# Patient Record
Sex: Female | Born: 1955
Health system: Southern US, Community
[De-identification: ages and names within clinical notes are randomized; demographics above are authoritative.]

## PROBLEM LIST (undated history)

## (undated) DIAGNOSIS — K589 Irritable bowel syndrome without diarrhea: Secondary | ICD-10-CM

## (undated) DIAGNOSIS — R011 Cardiac murmur, unspecified: Secondary | ICD-10-CM

## (undated) DIAGNOSIS — Z974 Presence of external hearing-aid: Secondary | ICD-10-CM

## (undated) DIAGNOSIS — H9190 Unspecified hearing loss, unspecified ear: Secondary | ICD-10-CM

## (undated) DIAGNOSIS — N189 Chronic kidney disease, unspecified: Secondary | ICD-10-CM

## (undated) DIAGNOSIS — J189 Pneumonia, unspecified organism: Secondary | ICD-10-CM

## (undated) DIAGNOSIS — N179 Acute kidney failure, unspecified: Secondary | ICD-10-CM

## (undated) DIAGNOSIS — I272 Pulmonary hypertension, unspecified: Secondary | ICD-10-CM

## (undated) DIAGNOSIS — E039 Hypothyroidism, unspecified: Secondary | ICD-10-CM

## (undated) DIAGNOSIS — F32A Depression, unspecified: Secondary | ICD-10-CM

## (undated) DIAGNOSIS — E0590 Thyrotoxicosis, unspecified without thyrotoxic crisis or storm: Secondary | ICD-10-CM

## (undated) DIAGNOSIS — Z87442 Personal history of urinary calculi: Secondary | ICD-10-CM

## (undated) DIAGNOSIS — G4730 Sleep apnea, unspecified: Secondary | ICD-10-CM

## (undated) DIAGNOSIS — J45909 Unspecified asthma, uncomplicated: Secondary | ICD-10-CM

## (undated) DIAGNOSIS — I70229 Atherosclerosis of native arteries of extremities with rest pain, unspecified extremity: Secondary | ICD-10-CM

## (undated) DIAGNOSIS — J9601 Acute respiratory failure with hypoxia: Secondary | ICD-10-CM

## (undated) DIAGNOSIS — D869 Sarcoidosis, unspecified: Secondary | ICD-10-CM

## (undated) DIAGNOSIS — K219 Gastro-esophageal reflux disease without esophagitis: Secondary | ICD-10-CM

## (undated) DIAGNOSIS — J302 Other seasonal allergic rhinitis: Secondary | ICD-10-CM

## (undated) DIAGNOSIS — I499 Cardiac arrhythmia, unspecified: Secondary | ICD-10-CM

## (undated) HISTORY — DX: Atherosclerosis of native arteries of extremities with rest pain, unspecified extremity: I70.229

## (undated) HISTORY — PX: EYE SURGERY: SHX253

## (undated) HISTORY — DX: Acute kidney failure, unspecified: N17.9

## (undated) HISTORY — PX: WISDOM TOOTH EXTRACTION: SHX21

## (undated) HISTORY — PX: TUBAL LIGATION: SHX77

## (undated) HISTORY — DX: Sarcoidosis, unspecified: D86.9

## (undated) HISTORY — PX: ABDOMINAL HYSTERECTOMY: SHX81

## (undated) HISTORY — PX: COLON SURGERY: SHX602

## (undated) HISTORY — DX: Acute respiratory failure with hypoxia: J96.01

---

## 1988-10-17 HISTORY — PX: PARTIAL HYSTERECTOMY: SHX80

## 2007-05-01 ENCOUNTER — Ambulatory Visit: Payer: Self-pay

## 2007-07-18 ENCOUNTER — Ambulatory Visit: Payer: Self-pay | Admitting: Specialist

## 2007-08-14 ENCOUNTER — Ambulatory Visit: Payer: Self-pay | Admitting: Obstetrics and Gynecology

## 2007-09-18 ENCOUNTER — Ambulatory Visit: Payer: Self-pay | Admitting: Unknown Physician Specialty

## 2007-09-18 HISTORY — PX: COLONOSCOPY: SHX5424

## 2007-12-05 ENCOUNTER — Ambulatory Visit: Payer: Self-pay | Admitting: Specialist

## 2008-06-25 ENCOUNTER — Ambulatory Visit: Payer: Self-pay

## 2008-07-14 ENCOUNTER — Ambulatory Visit: Payer: Self-pay | Admitting: Unknown Physician Specialty

## 2008-10-30 ENCOUNTER — Ambulatory Visit: Payer: Self-pay | Admitting: Obstetrics and Gynecology

## 2008-12-23 ENCOUNTER — Ambulatory Visit: Payer: Self-pay | Admitting: Unknown Physician Specialty

## 2010-03-25 ENCOUNTER — Ambulatory Visit: Payer: Self-pay | Admitting: Internal Medicine

## 2010-04-06 ENCOUNTER — Inpatient Hospital Stay: Payer: Self-pay | Admitting: Internal Medicine

## 2010-04-06 ENCOUNTER — Ambulatory Visit: Payer: Self-pay | Admitting: Unknown Physician Specialty

## 2010-05-26 ENCOUNTER — Ambulatory Visit: Payer: Self-pay | Admitting: Unknown Physician Specialty

## 2010-05-26 HISTORY — PX: COLONOSCOPY: SHX174

## 2010-05-28 LAB — PATHOLOGY REPORT

## 2010-06-04 ENCOUNTER — Ambulatory Visit: Payer: Self-pay | Admitting: Unknown Physician Specialty

## 2010-08-03 ENCOUNTER — Other Ambulatory Visit: Payer: Self-pay | Admitting: Unknown Physician Specialty

## 2010-10-21 ENCOUNTER — Other Ambulatory Visit: Payer: Self-pay | Admitting: Unknown Physician Specialty

## 2010-12-07 ENCOUNTER — Other Ambulatory Visit: Payer: Self-pay | Admitting: Unknown Physician Specialty

## 2010-12-19 ENCOUNTER — Inpatient Hospital Stay: Payer: Self-pay | Admitting: Surgery

## 2011-01-05 ENCOUNTER — Ambulatory Visit: Payer: Self-pay | Admitting: Surgery

## 2011-02-08 DIAGNOSIS — D869 Sarcoidosis, unspecified: Secondary | ICD-10-CM | POA: Insufficient documentation

## 2011-02-16 ENCOUNTER — Ambulatory Visit: Payer: Self-pay | Admitting: Surgery

## 2011-03-01 ENCOUNTER — Ambulatory Visit: Payer: Self-pay | Admitting: Surgery

## 2011-03-06 ENCOUNTER — Other Ambulatory Visit: Payer: Self-pay | Admitting: Surgery

## 2011-03-07 ENCOUNTER — Inpatient Hospital Stay: Payer: Self-pay | Admitting: Surgery

## 2011-03-08 LAB — PATHOLOGY REPORT

## 2011-05-10 ENCOUNTER — Ambulatory Visit: Payer: Self-pay | Admitting: Internal Medicine

## 2011-05-11 ENCOUNTER — Ambulatory Visit: Payer: Self-pay | Admitting: Urology

## 2011-08-04 ENCOUNTER — Ambulatory Visit: Payer: Self-pay | Admitting: Internal Medicine

## 2011-12-20 LAB — HM PAP SMEAR: HM Pap smear: NORMAL

## 2012-05-03 ENCOUNTER — Ambulatory Visit: Payer: Self-pay | Admitting: Urology

## 2012-08-22 ENCOUNTER — Ambulatory Visit: Payer: Self-pay | Admitting: Internal Medicine

## 2012-09-03 ENCOUNTER — Ambulatory Visit: Payer: Self-pay | Admitting: Internal Medicine

## 2012-12-14 ENCOUNTER — Other Ambulatory Visit: Payer: Self-pay | Admitting: Unknown Physician Specialty

## 2012-12-14 LAB — CLOSTRIDIUM DIFFICILE BY PCR

## 2012-12-26 ENCOUNTER — Other Ambulatory Visit: Payer: Self-pay | Admitting: Unknown Physician Specialty

## 2012-12-26 ENCOUNTER — Inpatient Hospital Stay: Payer: Self-pay | Admitting: Internal Medicine

## 2012-12-26 LAB — CBC WITH DIFFERENTIAL/PLATELET
Basophil #: 0 10*3/uL (ref 0.0–0.1)
Basophil %: 1 %
Eosinophil #: 0.2 10*3/uL (ref 0.0–0.7)
Eosinophil %: 6.2 %
HCT: 39.3 % (ref 35.0–47.0)
HGB: 13.5 g/dL (ref 12.0–16.0)
Lymphocyte #: 0.6 10*3/uL — ABNORMAL LOW (ref 1.0–3.6)
Lymphocyte %: 15.9 %
MCH: 30.7 pg (ref 26.0–34.0)
MCHC: 34.3 g/dL (ref 32.0–36.0)
MCV: 90 fL (ref 80–100)
Monocyte #: 0.4 x10 3/mm (ref 0.2–0.9)
Monocyte %: 11.2 %
Neutrophil #: 2.6 10*3/uL (ref 1.4–6.5)
Neutrophil %: 65.7 %
Platelet: 222 10*3/uL (ref 150–440)
RBC: 4.39 10*6/uL (ref 3.80–5.20)
RDW: 14.9 % — ABNORMAL HIGH (ref 11.5–14.5)
WBC: 4 10*3/uL (ref 3.6–11.0)

## 2012-12-26 LAB — BASIC METABOLIC PANEL
Anion Gap: 9 (ref 7–16)
BUN: 15 mg/dL (ref 7–18)
Calcium, Total: 8.6 mg/dL (ref 8.5–10.1)
Chloride: 102 mmol/L (ref 98–107)
Co2: 23 mmol/L (ref 21–32)
Creatinine: 1.21 mg/dL (ref 0.60–1.30)
EGFR (African American): 58 — ABNORMAL LOW
EGFR (Non-African Amer.): 50 — ABNORMAL LOW
Glucose: 94 mg/dL (ref 65–99)
Osmolality: 269 (ref 275–301)
Potassium: 3.5 mmol/L (ref 3.5–5.1)
Sodium: 134 mmol/L — ABNORMAL LOW (ref 136–145)

## 2012-12-26 LAB — HEPATIC FUNCTION PANEL A (ARMC)
Albumin: 3 g/dL — ABNORMAL LOW (ref 3.4–5.0)
Alkaline Phosphatase: 80 U/L (ref 50–136)
Bilirubin, Direct: 0.1 mg/dL (ref 0.00–0.20)
Bilirubin,Total: 0.6 mg/dL (ref 0.2–1.0)
SGOT(AST): 25 U/L (ref 15–37)
SGPT (ALT): 20 U/L (ref 12–78)
Total Protein: 6.9 g/dL (ref 6.4–8.2)

## 2012-12-26 LAB — SEDIMENTATION RATE: Erythrocyte Sed Rate: 46 mm/hr — ABNORMAL HIGH (ref 0–30)

## 2012-12-27 LAB — BASIC METABOLIC PANEL
Anion Gap: 6 — ABNORMAL LOW (ref 7–16)
BUN: 14 mg/dL (ref 7–18)
Calcium, Total: 8.4 mg/dL — ABNORMAL LOW (ref 8.5–10.1)
Chloride: 107 mmol/L (ref 98–107)
Co2: 25 mmol/L (ref 21–32)
Creatinine: 1.39 mg/dL — ABNORMAL HIGH (ref 0.60–1.30)
Glucose: 82 mg/dL (ref 65–99)
Osmolality: 275 (ref 275–301)
Potassium: 3.7 mmol/L (ref 3.5–5.1)
Sodium: 138 mmol/L (ref 136–145)

## 2012-12-27 LAB — CBC WITH DIFFERENTIAL/PLATELET
Basophil #: 0 10*3/uL (ref 0.0–0.1)
Basophil %: 1 %
Eosinophil #: 0.6 10*3/uL (ref 0.0–0.7)
Eosinophil %: 15.7 %
HCT: 35.2 % (ref 35.0–47.0)
HGB: 11.9 g/dL — ABNORMAL LOW (ref 12.0–16.0)
Lymphocyte #: 0.8 10*3/uL — ABNORMAL LOW (ref 1.0–3.6)
Lymphocyte %: 19.2 %
MCH: 30.3 pg (ref 26.0–34.0)
MCHC: 33.9 g/dL (ref 32.0–36.0)
MCV: 90 fL (ref 80–100)
Monocyte #: 0.6 x10 3/mm (ref 0.2–0.9)
Monocyte %: 13.9 %
Neutrophil #: 2 10*3/uL (ref 1.4–6.5)
Neutrophil %: 50.2 %
Platelet: 203 10*3/uL (ref 150–440)
RBC: 3.93 10*6/uL (ref 3.80–5.20)
RDW: 15.1 % — ABNORMAL HIGH (ref 11.5–14.5)
WBC: 4 10*3/uL (ref 3.6–11.0)

## 2012-12-27 LAB — CLOSTRIDIUM DIFFICILE BY PCR

## 2012-12-28 LAB — CBC WITH DIFFERENTIAL/PLATELET
Basophil #: 0 10*3/uL (ref 0.0–0.1)
Basophil %: 0.7 %
Eosinophil #: 0.8 10*3/uL — ABNORMAL HIGH (ref 0.0–0.7)
Eosinophil %: 17.2 %
HCT: 31.1 % — ABNORMAL LOW (ref 35.0–47.0)
HGB: 10.6 g/dL — ABNORMAL LOW (ref 12.0–16.0)
Lymphocyte #: 0.7 10*3/uL — ABNORMAL LOW (ref 1.0–3.6)
Lymphocyte %: 14.5 %
MCH: 30.7 pg (ref 26.0–34.0)
MCHC: 34.2 g/dL (ref 32.0–36.0)
MCV: 90 fL (ref 80–100)
Monocyte #: 0.6 x10 3/mm (ref 0.2–0.9)
Monocyte %: 12.9 %
Neutrophil #: 2.7 10*3/uL (ref 1.4–6.5)
Neutrophil %: 54.7 %
Platelet: 197 10*3/uL (ref 150–440)
RBC: 3.46 10*6/uL — ABNORMAL LOW (ref 3.80–5.20)
RDW: 14.8 % — ABNORMAL HIGH (ref 11.5–14.5)
WBC: 4.9 10*3/uL (ref 3.6–11.0)

## 2012-12-28 LAB — BASIC METABOLIC PANEL
Anion Gap: 4 — ABNORMAL LOW (ref 7–16)
BUN: 8 mg/dL (ref 7–18)
Calcium, Total: 8 mg/dL — ABNORMAL LOW (ref 8.5–10.1)
Chloride: 111 mmol/L — ABNORMAL HIGH (ref 98–107)
Co2: 27 mmol/L (ref 21–32)
Creatinine: 1.14 mg/dL (ref 0.60–1.30)
EGFR (African American): >60
EGFR (Non-African Amer.): 53 — ABNORMAL LOW
Glucose: 95 mg/dL (ref 65–99)
Osmolality: 281 (ref 275–301)
Potassium: 3.4 mmol/L — ABNORMAL LOW (ref 3.5–5.1)
Sodium: 142 mmol/L (ref 136–145)

## 2012-12-28 LAB — WBCS, STOOL

## 2012-12-29 LAB — STOOL CULTURE

## 2012-12-30 LAB — STOOL CULTURE

## 2013-01-01 LAB — CULTURE, BLOOD (SINGLE)

## 2013-03-12 ENCOUNTER — Ambulatory Visit (INDEPENDENT_AMBULATORY_CARE_PROVIDER_SITE_OTHER): Payer: BC Managed Care – PPO | Admitting: Pulmonary Disease

## 2013-03-12 ENCOUNTER — Encounter: Payer: Self-pay | Admitting: Pulmonary Disease

## 2013-03-12 VITALS — BP 142/80 | HR 90 | Temp 98.7°F | Ht 68.5 in | Wt 198.8 lb

## 2013-03-12 DIAGNOSIS — A0472 Enterocolitis due to Clostridium difficile, not specified as recurrent: Secondary | ICD-10-CM | POA: Insufficient documentation

## 2013-03-12 DIAGNOSIS — N184 Chronic kidney disease, stage 4 (severe): Secondary | ICD-10-CM

## 2013-03-12 DIAGNOSIS — D869 Sarcoidosis, unspecified: Secondary | ICD-10-CM

## 2013-03-12 DIAGNOSIS — J99 Respiratory disorders in diseases classified elsewhere: Secondary | ICD-10-CM

## 2013-03-12 DIAGNOSIS — N183 Chronic kidney disease, stage 3 unspecified: Secondary | ICD-10-CM | POA: Insufficient documentation

## 2013-03-12 DIAGNOSIS — D860 Sarcoidosis of lung: Secondary | ICD-10-CM | POA: Insufficient documentation

## 2013-03-12 NOTE — Assessment & Plan Note (Addendum)
Stacie [name] has diffuse parenchymal lung disease based on a CXR I can see from 2011 and has been treated as pulmonary sarcoidosis by [facility] since 2008.  Presumably she has pulmonary and renal sarcoid without other system involvement.   Unfortunately today I don't have records to review the details of her diagnosis or treatment course.    Fortunately, she seems to do fairly well from a functional standpoint as evidenced by her 6MW (1500 feet on RA).  Her EKG today did not show evidence of a conduction abnormality and she has annual opthalmology exams which so far have not shown evidence of sarcoid involvement.  Plan: -obtain records from [facility] pulmonology, nephrology, and all path/radiology/lab reports for my review -continue Methotrexate and Plaquenil for now -Consider adding PCP prophylaxis (dapsone? as bactrim likely contraindicated), review [facility] records first -Full PFT [facility]

## 2013-03-12 NOTE — Assessment & Plan Note (Signed)
She has seen [facility] nephrology in the past for this and does not have a history of biopsy or other comorbid conditions that would cause this.  It is unclear to me if this is considered to be due to her Sarcoid.  Plan: -obtain records from [facility] nephrology

## 2013-03-12 NOTE — Patient Instructions (Signed)
Keep taking your Methotrexate and Plaquenil and Advair as you are doing  We will see you back in 6-8 weeks after we have gotten all records from [facility]

## 2013-03-12 NOTE — Assessment & Plan Note (Signed)
This has been a recurrent issue for her as she has had it three times.  Currently not active.  Will need to be mindful of this with infections in the future and consider carefully if we add PCP prophylaxis.

## 2013-03-12 NOTE — Progress Notes (Signed)
Subjective:    Patient ID: Stacie [name], female    DOB: 1956/07/30, 57 y.o.   MRN: [id number]  HPI This is a very pleasant 57 year old female who comes to our clinic today for evaluation of sarcoid. She had a normal childhood without respiratory illnesses and never smoked cigarettes consistently. However in 2003 she started having some shortness of breath and recurrent cough. This is associated with fatigue. She was initially evaluated by a local pulmonologist to trigger off and on with prednisone but no definitive diagnosis was ever made. She was eventually referred to the [facility] where she saw Dr. Leona Carry for evaluation of the same. There she was found to have diffuse parenchymal lung disease and mediastinal lymphadenopathy. In 2009 she underwent what sounds like an EBUS guided FNA of a mediastinal lymph node and a diagnosis of pulmonary sarcoid was made. Since 2009 she was treated with near continuous prednisone which she said did improve her fatigue, cough, and shortness of breath. However she did not see significant enough improvement to ever come off the medication. In fact she thinks that perhaps it just kept her symptoms stable rather than causing improvement. Because this was associated with weight gain and "other nasty side effects". She was transitioned to methotrexate and plaquenil earlier this year. In the last several years she has also been diagnosed with chronic kidney disease. She is unaware of the cause of this but feels that it may be related to sarcoid. She also sees nephrology at [facility] for this as well. She is also developed hypercalcemia which has been responsive to steroid treatment over the years.   On a daily basis she exercises regularly but states that she will sometimes still has shortness of breath. For example, when going to the beach recently she stated that when she was carrying luggage or groceries up 2 flights of stairs she became so winded  that she had to stop completely, sit down and rest to catch her breath. She has a dry cough during the daytime alone.  She has recurrent upper respiratory infections, most recently she was treated for laryngitis by her ear nose and throat physician over one week ago. She's treated with both prednisone and azithromycin.  Unfortunately she has had 3 episodes of Clostridium difficile diarrhea over the last several years.  She is a never smoker. Her father had rheumatoid arthritis and asthma. She states that her DEXA scans have been normal over the years despite her ongoing prednisone use.  Past Medical History  Diagnosis Date  . Sarcoid      Family History  Problem Relation Age of Onset  . Allergies Father   . Asthma Father   . Allergies Brother   . Asthma Brother   . Colon cancer Father      History   Social History  . Marital Status: Married    Spouse Name: N/A    Number of Children: N/A  . Years of Education: N/A   Occupational History  . Homemaker    Social History Main Topics  . Smoking status: Never Smoker   . Smokeless tobacco: Never Used  . Alcohol Use: No  . Drug Use: No  . Sexually Active: Not on file   Other Topics Concern  . Not on file   Social History Narrative  . No narrative on file     Allergies  Allergen Reactions  . Sulfa Antibiotics Rash    As an infant  No outpatient prescriptions prior to visit.   No facility-administered medications prior to visit.      Review of Systems  Constitutional: Negative for fever, chills and unexpected weight change.  HENT: Positive for sore throat and trouble swallowing. Negative for ear pain, nosebleeds, congestion, rhinorrhea, sneezing, dental problem, voice change, postnasal drip and sinus pressure.   Eyes: Negative for visual disturbance.  Respiratory: Positive for cough and shortness of breath. Negative for choking.   Cardiovascular: Negative for chest pain and leg swelling.  Gastrointestinal:  Negative for vomiting, abdominal pain and diarrhea.  Genitourinary: Negative for difficulty urinating.  Musculoskeletal: Positive for arthralgias.  Skin: Negative for rash.  Neurological: Negative for tremors, syncope and headaches.  Hematological: Does not bruise/bleed easily.       Objective:   Physical Exam  Filed Vitals:   03/12/13 0913  BP: 142/80  Pulse: 90  Temp: 98.7 F (37.1 C)  TempSrc: Oral  Height: 5' 8.5" (1.74 m)  Weight: 198 lb 12.8 oz (90.175 kg)  SpO2: 98%  RA  Diagnosed 2009 [facility] with EBUS FNA Prednisone off and on 2003-2014 11/2012 MTX/HCQ started [facility] ?kidney involvement 03/12/2013 6 MW > 1500 feet, O2 saturation nadir 88% RA, HR max 132      Assessment & Plan:   Pulmonary sarcoidosis Stacie [name] has diffuse parenchymal lung disease based on a CXR I can see from 2011 and has been treated as pulmonary sarcoidosis by [facility] since 2008.  Presumably she has pulmonary and renal sarcoid without other system involvement.   Unfortunately today I don't have records to review the details of her diagnosis or treatment course.    Fortunately, she seems to do fairly well from a functional standpoint as evidenced by her 6MW (1500 feet on RA).  Her EKG today did not show evidence of a conduction abnormality and she has annual opthalmology exams which so far have not shown evidence of sarcoid involvement.  Plan: -obtain records from [facility] pulmonology, nephrology, and all path/radiology/lab reports for my review -continue Methotrexate and Plaquenil for now -Consider adding PCP prophylaxis (dapsone? 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  Currently not active.  Will need to be mindful of this with infections in the future and consider carefully if we add PCP prophylaxis.  CKD She has seen [facility] nephrology in the past for this and  does not have a history of biopsy or other comorbid conditions that would cause this.  It is unclear to me if this is considered to be due to her Sarcoid.  Plan: -obtain records from [facility] nephrology    Updated Medication List Outpatient Encounter Prescriptions as of 03/12/2013  Medication Sig Dispense Refill  . albuterol (VENTOLIN HFA) 108 (90 BASE) MCG/ACT inhaler Inhale 2 puffs into the lungs every 6 (six) hours as needed for wheezing.      Marland Kitchen aspirin 81 MG tablet Take 81 mg by mouth daily.      . fluticasone-salmeterol (ADVAIR HFA) 115-21 MCG/ACT inhaler Inhale 1 puff into the lungs 2 (two) times daily.      Marland Kitchen GLUCOSAMINE PO Take 1 tablet by mouth daily.      . hydroxychloroquine (PLAQUENIL) 200 MG tablet Take 100 mg by mouth 2 (two) times daily.      Marland Kitchen levothyroxine (SYNTHROID, LEVOTHROID) 137 MCG tablet Take 137 mcg by mouth daily before breakfast.      .  methotrexate (RHEUMATREX) 15 MG tablet Take 15 mg by mouth once a week. Caution: Chemotherapy. Protect from light.      . mometasone (NASONEX) 50 MCG/ACT nasal spray Place 1 spray into the nose 2 (two) times daily.      . Multiple Vitamin (MULTIVITAMIN) capsule Take 1 capsule by mouth daily.      . potassium chloride (MICRO-K) 10 MEQ CR capsule Take 10 mEq by mouth 2 (two) times daily.       No facility-administered encounter medications on file as of 03/12/2013.

## 2013-03-19 ENCOUNTER — Telehealth: Payer: Self-pay | Admitting: *Deleted

## 2013-03-19 NOTE — Telephone Encounter (Signed)
Message copied by [name] on Tue Mar 19, 2013  5:29 PM ------      Message from: Juanito Doom      Created: Tue Mar 19, 2013  5:24 PM       Magda Paganini,            Can you check on the status of this lady's records coming from [facility]?  I asked Mendel Ryder to look into it today but I never heard back from her.            Thanks      Ruby Cola ------

## 2013-03-19 NOTE — Telephone Encounter (Signed)
I have tried to call [facility] medical records several times and no one ever comes to the phone.  Faxing a fax cover sheet to [phone] to request the records that we have already contacted them about.  Records will be faxed to the [facility] office for BQ review.

## 2013-03-19 NOTE — Telephone Encounter (Signed)
Message copied by Horatio Pel on Tue Mar 19, 2013  4:06 PM ------      Message from: Juanito Doom      Created: Tue Mar 19, 2013 12:40 PM       L,            Can you call [facility] and check on them getting [place] her records?  They did not send them after Magda Paganini requested them last week.            Thanks      Ruby Cola ------

## 2013-03-20 NOTE — Telephone Encounter (Signed)
Per Ria Comment- she re faxed request for the pt's records yesterday [facility] at [phone] Was advised that they never received a request and we need to refax I called and spoke with Shirlean Mylar at the [facility] and she is to refax me the pt's signed release so I can refax to [facility] at [phone] Will await fax

## 2013-03-20 NOTE — Telephone Encounter (Signed)
Received the fax and resent to [facility] Will await the records

## 2013-03-22 NOTE — Telephone Encounter (Signed)
I have received records and will give to Dr. Lake Bells when he comes in 03/25/13

## 2013-03-22 NOTE — Telephone Encounter (Signed)
Still have not received the records  I have faxed this x 3 now Called and spoke with Peter Congo, with medical records dept at [facility] She states that she tried faxing the records here to [facility] as I requested x 8 and they never would go through, so she faxed to [facility] and spoke with Lorriane Shire and she is to fax to triage now Will await records

## 2013-03-27 ENCOUNTER — Telehealth: Payer: Self-pay | Admitting: Pulmonary Disease

## 2013-03-27 MED ORDER — CIPROFLOXACIN HCL 500 MG PO TABS: ORAL_TABLET | ORAL | Status: DC

## 2013-03-27 NOTE — Telephone Encounter (Signed)
.  spoke with pt advised of [place] rec's pt verbally u nderstood. rx called in

## 2013-03-27 NOTE — Telephone Encounter (Signed)
Can call in cipro 546m one each am for 5 days. (has renal insuff). Needs to take probiotic, either yogurt or something else she has had success with in the past.

## 2013-03-27 NOTE — Telephone Encounter (Signed)
Called, spoke with pt.  C/o a prod, wet cough with increase in mucus (yellow), increased DOE, feels "very tired," a little wheezing, chest congestion, and sweating a lot qhs.  Symptoms started x 3-4 days ago.  No chest tightness, chest pain, or fever.  Using a cough syrup qhs with relief along with maintenance advair hfa.  She called her ENT but he advised her to call us.  She does have a pending acute visit with RA on Friday.  Would like to see if something can be called before this.  As BQ is on night float tonight, will send to doc of the day.  KC, pls advise.  Thank you.  ** Pt states she has gotten cdiff in the past from [facility].  States she does take a probiotic with them. She would like MD to be aware of this.  Last OV with BQ: 03/12/13; asked to f/u in 2 months  [facility] verified with pt:  Allergies  Allergen Reactions  . Sulfa Antibiotics Rash    As an infant

## 2013-03-29 ENCOUNTER — Other Ambulatory Visit (INDEPENDENT_AMBULATORY_CARE_PROVIDER_SITE_OTHER): Payer: BC Managed Care – PPO

## 2013-03-29 ENCOUNTER — Encounter: Payer: Self-pay | Admitting: Pulmonary Disease

## 2013-03-29 ENCOUNTER — Ambulatory Visit (INDEPENDENT_AMBULATORY_CARE_PROVIDER_SITE_OTHER)
Admission: RE | Admit: 2013-03-29 | Discharge: 2013-03-29 | Disposition: A | Discharge status: Home / Self Care | Payer: BC Managed Care – PPO | Source: Ambulatory Visit | Attending: Pulmonary Disease | Admitting: Pulmonary Disease

## 2013-03-29 ENCOUNTER — Ambulatory Visit (INDEPENDENT_AMBULATORY_CARE_PROVIDER_SITE_OTHER): Payer: BC Managed Care – PPO | Admitting: Pulmonary Disease

## 2013-03-29 VITALS — BP 132/80 | HR 102 | Temp 98.8°F | Ht 67.5 in | Wt 201.0 lb

## 2013-03-29 DIAGNOSIS — D869 Sarcoidosis, unspecified: Secondary | ICD-10-CM

## 2013-03-29 DIAGNOSIS — J99 Respiratory disorders in diseases classified elsewhere: Secondary | ICD-10-CM

## 2013-03-29 DIAGNOSIS — D860 Sarcoidosis of lung: Secondary | ICD-10-CM

## 2013-03-29 LAB — CBC WITH DIFFERENTIAL/PLATELET
Basophils Absolute: 0 10*3/uL (ref 0.0–0.1)
Basophils Relative: 0.4 % (ref 0.0–3.0)
Eosinophils Absolute: 0.5 10*3/uL (ref 0.0–0.7)
Eosinophils Relative: 5.7 % — ABNORMAL HIGH (ref 0.0–5.0)
HCT: 38.4 % (ref 36.0–46.0)
Hemoglobin: 12.8 g/dL (ref 12.0–15.0)
Lymphocytes Relative: 13.5 % (ref 12.0–46.0)
Lymphs Abs: 1.1 10*3/uL (ref 0.7–4.0)
MCHC: 33.3 g/dL (ref 30.0–36.0)
MCV: 88.9 fl (ref 78.0–100.0)
Monocytes Absolute: 0.9 10*3/uL (ref 0.1–1.0)
Monocytes Relative: 10.9 % (ref 3.0–12.0)
Neutro Abs: 5.9 10*3/uL (ref 1.4–7.7)
Neutrophils Relative %: 69.5 % (ref 43.0–77.0)
Platelets: 410 10*3/uL — ABNORMAL HIGH (ref 150.0–400.0)
RBC: 4.32 Mil/uL (ref 3.87–5.11)
RDW: 15 % — ABNORMAL HIGH (ref 11.5–14.6)
WBC: 8.5 10*3/uL (ref 4.5–10.5)

## 2013-03-29 LAB — BASIC METABOLIC PANEL
BUN: 19 mg/dL (ref 6–23)
CO2: 27 mEq/L (ref 19–32)
Calcium: 9.7 mg/dL (ref 8.4–10.5)
Chloride: 105 mEq/L (ref 96–112)
Creatinine, Ser: 1.4 mg/dL — ABNORMAL HIGH (ref 0.4–1.2)
GFR: 41.06 mL/min — ABNORMAL LOW (ref >60.00)
Glucose, Bld: 97 mg/dL (ref 70–99)
Potassium: 3.8 mEq/L (ref 3.5–5.1)
Sodium: 141 mEq/L (ref 135–145)

## 2013-03-29 MED ORDER — PREDNISONE 10 MG PO TABS: ORAL_TABLET | ORAL | Status: DC

## 2013-03-29 NOTE — Progress Notes (Signed)
  Subjective:    Patient ID: Stacie [name], female    DOB: [birth year], 57 y.o.   MRN: [id number]  HPI 57 year old never smoker  For FU of sarcoid. She was initially evaluated by a local pulmonologist in 2003 to trigger off and on with prednisone but no definitive diagnosis was ever made. She was eventually referred to the [facility] where she saw Dr. Leona Carry for evaluation of the same. There she was found to have diffuse parenchymal lung disease and mediastinal lymphadenopathy. In 2009 she underwent what sounds like an EBUS guided FNA of a mediastinal lymph node and a diagnosis of pulmonary sarcoid was made. Since 2009 she was treated with near continuous prednisone which she said did improve her fatigue, cough, and shortness of breath. However she did not see significant enough improvement to ever come off the medication. She was transitioned to methotrexate and plaquenil earlier this year. In the last several years she has also been diagnosed with chronic kidney disease. ? related to sarcoid with hypercalcemia  She has recurrent upper respiratory infections, most recently she was treated for laryngitis by her ear nose and throat physician over one week ago. She's treated with both prednisone and azithromycin.  Unfortunately she has had 3 episodes of Clostridium difficile diarrhea over the last several years.    03/29/2013 Acute OV   BQ patient. Reports coughing with production of yellow/white thick mucus, hoarseness, SOB. Denies fever/chills, body aches. C/o a prod, wet cough with increase in mucus (yellow), increased DOE, feels "very tired," a little wheezing, chest congestion, and sweating a lot qhs. Symptoms started x 7 days ago. No chest tightness, chest pain, or fever. Using a cough syrup qhs with relief along with maintenance advair hfa. She called her ENT but he advised her to call us.  6/11 cipro called in with probiotic, has taken this x 2 ds No fever Advair is 3rd tier  now, very expensive , requests change  Past Medical History  Diagnosis Date  . Sarcoid    LABS - WBC nml, Ca ok, cr 1.4 CXR - (no comparison) Scattered coarse pulmonary opacity, most confluent in the  medial upper lung and infrahilar regions. Both hila appear mildly prominent.   Review of Systems neg for any significant sore throat, dysphagia, itching, sneezing, nasal congestion or excess/ purulent secretions, fever, chills, sweats, unintended wt loss, pleuritic or exertional cp, hempoptysis, orthopnea pnd or change in chronic leg swelling. Also denies presyncope, palpitations, heartburn, abdominal pain, nausea, vomiting, diarrhea or change in bowel or urinary habits, dysuria,hematuria, rash, arthralgias, visual complaints, headache, numbness weakness or ataxia.     Objective:   Physical Exam  Gen. Pleasant, well-nourished, in no distress, normal affect ENT - no lesions, no post nasal drip Neck: No JVD, no thyromegaly, no carotid bruits Lungs: no use of accessory muscles, no dullness to percussion, clear without rales,  few rhonchi after coughing Cardiovascular: Rhythm regular, heart sounds  normal, no murmurs or gallops, no peripheral edema Abdomen: soft and non-tender, no hepatosplenomegaly, BS normal. Musculoskeletal: No deformities, no cyanosis or clubbing Neuro:  alert, non focal       Assessment & Plan:

## 2013-03-29 NOTE — Patient Instructions (Signed)
CXR & blood work today Complete antibiotic (cipro ), take probiotic Prednisone 10 mg tabs  Take 2 tabs daily with food x 5ds, then 1 tab daily with food x 5ds then STOP OK to change to symbicort instead of advair  - check with insurance

## 2013-03-29 NOTE — Assessment & Plan Note (Addendum)
CXR - s/o chronic changes c/w sarcoid  - no acute consolidation & blood work unremarkable for acute infection Complete antibiotic (would prefer levaquin but cipro was started ), take probiotic given prior h/o c cdiff Will reat as sarcoid flare with Prednisone 10 mg tabs  Take 2 tabs daily with food x 5ds, then 1 tab daily with food x 5ds then STOP OK to change to symbicort instead of advair  - check with insurance

## 2013-03-30 LAB — ANGIOTENSIN CONVERTING ENZYME: Angiotensin-Converting Enzyme: 52 U/L (ref 8–52)

## 2013-04-01 ENCOUNTER — Other Ambulatory Visit: Payer: Self-pay | Admitting: *Deleted

## 2013-04-01 ENCOUNTER — Ambulatory Visit: Payer: BC Managed Care – PPO | Admitting: Pulmonary Disease

## 2013-04-01 ENCOUNTER — Telehealth: Payer: Self-pay | Admitting: Pulmonary Disease

## 2013-04-01 MED ORDER — TRAMADOL HCL 50 MG PO TABS: 50.0000 mg | ORAL_TABLET | Freq: Four times a day (QID) | ORAL | Status: DC | PRN

## 2013-04-01 NOTE — Telephone Encounter (Signed)
I talked to Ms. Stacie [name] on the phone this morning.  She states that her mucus production has improved but she still has a dry, hacking cough and hoarseness. She denied dyspnea or fevers.  She finished the antibiotics today.  Based on lack of fevers or dyspnea and considering her improved mucus production, I don't think she needs more antibiotics.    I advised voice rest, cough suppression. Gave an Rx for tramadol prn, recommended hydrocodone if tramadol doesn't help (instructed not to use together).  If no improvement in hoarseness f/u with ENT or [place].    She voiced understanding.

## 2013-04-09 ENCOUNTER — Ambulatory Visit: Payer: Self-pay | Admitting: Pulmonary Disease

## 2013-04-09 LAB — PULMONARY FUNCTION TEST

## 2013-04-10 ENCOUNTER — Encounter: Payer: Self-pay | Admitting: Unknown Physician Specialty

## 2013-04-16 ENCOUNTER — Encounter: Payer: Self-pay | Admitting: Unknown Physician Specialty

## 2013-04-17 ENCOUNTER — Encounter: Payer: Self-pay | Admitting: Pulmonary Disease

## 2013-04-17 ENCOUNTER — Telehealth: Payer: Self-pay | Admitting: *Deleted

## 2013-04-17 NOTE — Telephone Encounter (Signed)
Message copied by [name] on Wed Apr 17, 2013 11:55 AM ------      Message from: Simonne Maffucci B      Created: Wed Apr 17, 2013  9:46 AM       L,            Please let her know that her PFTs were good.            Thanks,      B ------

## 2013-04-17 NOTE — Telephone Encounter (Signed)
Spoke with the pt and notified of results/recs per Dr Lake Bells and she verbalized understanding and denied any questions

## 2013-04-30 ENCOUNTER — Ambulatory Visit: Payer: BC Managed Care – PPO | Admitting: Pulmonary Disease

## 2013-04-30 ENCOUNTER — Encounter: Payer: Self-pay | Admitting: Pulmonary Disease

## 2013-04-30 ENCOUNTER — Ambulatory Visit (INDEPENDENT_AMBULATORY_CARE_PROVIDER_SITE_OTHER): Payer: BC Managed Care – PPO | Admitting: Pulmonary Disease

## 2013-04-30 VITALS — BP 110/76 | HR 87 | Ht 68.5 in | Wt 198.0 lb

## 2013-04-30 DIAGNOSIS — D869 Sarcoidosis, unspecified: Secondary | ICD-10-CM

## 2013-04-30 DIAGNOSIS — N184 Chronic kidney disease, stage 4 (severe): Secondary | ICD-10-CM

## 2013-04-30 DIAGNOSIS — J99 Respiratory disorders in diseases classified elsewhere: Secondary | ICD-10-CM

## 2013-04-30 DIAGNOSIS — Z5181 Encounter for therapeutic drug level monitoring: Secondary | ICD-10-CM

## 2013-04-30 DIAGNOSIS — R059 Cough, unspecified: Secondary | ICD-10-CM | POA: Insufficient documentation

## 2013-04-30 DIAGNOSIS — D860 Sarcoidosis of lung: Secondary | ICD-10-CM

## 2013-04-30 DIAGNOSIS — R05 Cough: Secondary | ICD-10-CM

## 2013-04-30 MED ORDER — FOLIC ACID 1 MG PO TABS: 1.0000 mg | ORAL_TABLET | Freq: Every day | ORAL | Status: DC

## 2013-04-30 MED ORDER — TRAMADOL HCL 50 MG PO TABS: 50.0000 mg | ORAL_TABLET | Freq: Four times a day (QID) | ORAL | Status: DC | PRN

## 2013-04-30 NOTE — Assessment & Plan Note (Addendum)
Now that she has recovered from an episode of tracheobronchitis, Stacie [name] is doing OK. I have been able to review records from [facility] which are consistent with the diagnosis of sarcoid.  I am pleased that her lung function tests are essentially normal with some mild airflow obstruction and mild decrease in her DLCO. Her 6 minute walk on the last visit was excellent as well.  It is unclear if her disease will progress or not, however I agree completely with the decision by her physicians at [facility] to treat with methotrexate and Plaquenil.  We discussed the possibility of antibiotic use for PCP prophylaxis. The literature suggests that patients on methotrexate are not at an exceedingly high risk for PCP. Considering her significant C. difficile which she has experienced in the past, continuing without PCP prophylaxis is reasonable this point.  I am happy to start prescribing this medicine that she desires to change care to [facility].  Plan: -Continue methotrexate, we will plan for an 18-24 months treatment period -Continue Advair -Start folic acid 1 mg daily to reduce risk of methotrexate-induced anemia -Check CBC in 2 months to monitor for bone marrow suppression -Check full pulmonary function test in 4 months and 6 minute walk in 4 months to monitor for disease progression -Check basic metabolic panel in 2 months -Establish care locally with nephrology for sarcoid kidney -Establish care locally with ophthalmology for sarcoidosis

## 2013-04-30 NOTE — Patient Instructions (Signed)
Start taking folate (folic acid) 41m daily We will order blood work for two months from now  We will order full pulmonary function testing for four months from now  We will see you back in 4 months or sooner if needed

## 2013-04-30 NOTE — Assessment & Plan Note (Signed)
This is due in large part to recent tracheobronchitis as well as some vocal cord problems managed by her ear nose and throat physician. She has done well with speech therapy lately.  Plan: -Continue as needed tramadol, advised to be careful with this if she is prescribed narcotics

## 2013-04-30 NOTE — Progress Notes (Signed)
Subjective:    Patient ID: Stacie [name], female    DOB: 03-16-1956, 57 y.o.   MRN: [id number]   Synopsis: Stacie [name] first saw the [facility] pulmonary clinic in May 2014 for evaluation of sarcoidosis. She was diagnosed with sarcoid in 2009 at the [place] via an EBUS guided fine needle aspiration of a hilar lymph node as well as an endobronchial biopsy which showed noncaseating granulomas with negative special stains for infectious organisms. She was treated off and on with steroids from 2009 2 2014 and then in 2014 was started on methotrexate in addition to Plaquenil. She also has chronic kidney disease which is believed to be directly related to sarcoidosis.  HPI  04/30/2013 ROV >  Since our last visit When suffered an episode of tracheobronchitis which has since improved. She had hoarseness and some vocal cord problems which are improving with care from her ear nose and throat physician as well as speech therapy. She states that her breathing is about the same since the last visit. She still struggles with shortness of breath after climbing 2 flights of stairs or walking lengthy distances. She does walk at least 1 mile for exercise daily on level ground and does not have to stop while doing this. She states that participating in activities of daily living around the house has been pretty much normal. Her cough is improved significantly with the use of tramadol. She continues to take the methotrexate and the Plaquenil.  Past Medical History  Diagnosis Date  . Sarcoid      Review of Systems  Constitutional: Positive for fatigue. Negative for fever and chills.  HENT: Negative for nosebleeds, congestion and rhinorrhea.   Respiratory: Positive for shortness of breath. Negative for cough and wheezing.   Cardiovascular: Negative for chest pain, palpitations and leg swelling.       Objective:   Physical Exam Filed Vitals:   04/30/13 1604  BP:  110/76  Pulse: 87  Height: 5' 8.5" (1.74 m)  Weight: 198 lb (89.812 kg)  SpO2: 99%   Gen: well appearing, no acute distress HEENT: NCAT,  EOMi, OP clear,  PULM: few crackles bilaterally, mostly clear CV: RRR, no mgr, no JVD AB: BS+, soft, nontender, no hsm Ext: warm, no edema, no clubbing, no cyanosis  Diagnosed 2009 [facility] with EBUS FNA of a hilar lymph node as well as an endobronchial lesion showing noncaseating granulomas, special stains were negative for fungal or AFB Prednisone off and on 2003-2014 11/2012 MTX/HCQ started [facility] Kidney involvement 03/12/2013 6 MW > 1500 feet, O2 saturation nadir 88% RA, HR max 132 03/2013 PFT > Ratio 66%, FEV 2.2L (82% pred), TLC 4.62 L (81% pred), DLCO 16.7 68%     Assessment & Plan:   Pulmonary sarcoidosis Now that she has recovered from an episode of tracheobronchitis, Stacie [name] is doing OK. I have been able to review records from [facility] which are consistent with the diagnosis of sarcoid.  I am pleased that her lung function tests are essentially normal with some mild airflow obstruction and mild decrease in her DLCO. Her 6 minute walk on the last visit was excellent as well.  It is unclear if her disease will progress or not, however I agree completely with the decision by her physicians at [facility] to treat with methotrexate and Plaquenil.  We discussed the possibility of antibiotic use for PCP prophylaxis. The literature suggests that patients on methotrexate are not at an exceedingly  high risk for PCP. Considering her significant C. difficile which she has experienced in the past, continuing without PCP prophylaxis is reasonable this point.  I am happy to start prescribing this medicine that she desires to change care to [facility].  Plan: -Continue methotrexate, we will plan for an 18-24 months treatment period -Continue Advair -Start folic acid 1 mg daily to reduce risk of methotrexate-induced anemia -Check CBC in 2 months to  monitor for bone marrow suppression -Check full pulmonary function test in 4 months and 6 minute walk in 4 months to monitor for disease progression -Check basic metabolic panel in 2 months -Establish care locally with nephrology for sarcoid kidney -Establish care locally with ophthalmology for sarcoidosis   CKD This is due in large part to recent tracheobronchitis as well as some vocal cord problems managed by her ear nose and throat physician. She has done well with speech therapy lately.  Plan: -Continue as needed tramadol, advised to be careful with this if she is prescribed narcotics    Updated Medication List Outpatient Encounter Prescriptions as of 04/30/2013  Medication Sig Dispense Refill  . albuterol (VENTOLIN HFA) 108 (90 BASE) MCG/ACT inhaler Inhale 2 puffs into the lungs every 6 (six) hours as needed for wheezing.      Marland Kitchen aspirin 81 MG tablet Take 81 mg by mouth daily.      . fluticasone-salmeterol (ADVAIR HFA) 115-21 MCG/ACT inhaler Inhale 1 puff into the lungs 2 (two) times daily.      Marland Kitchen GLUCOSAMINE PO Take 1 tablet by mouth daily.      . hydroxychloroquine (PLAQUENIL) 200 MG tablet Take 100 mg by mouth 2 (two) times daily.      Marland Kitchen levothyroxine (SYNTHROID, LEVOTHROID) 137 MCG tablet Take 137 mcg by mouth daily before breakfast.      . methotrexate (RHEUMATREX) 15 MG tablet Take 15 mg by mouth once a week. Caution: Chemotherapy. Protect from light.      . mometasone (NASONEX) 50 MCG/ACT nasal spray Place 1 spray into the nose 2 (two) times daily.      . Multiple Vitamin (MULTIVITAMIN) capsule Take 1 capsule by mouth daily.      . potassium chloride (MICRO-K) 10 MEQ CR capsule Take 10 mEq by mouth 2 (two) times daily.      . traMADol (ULTRAM) 50 MG tablet Take 1 tablet (50 mg total) by mouth every 6 (six) hours as needed.  50 tablet  1  . [DISCONTINUED] ciprofloxacin (CIPRO) 500 MG tablet Take 1 tablet in the am x 5 days  5 tablet  0  . [DISCONTINUED] predniSONE (DELTASONE) 10  MG tablet Take 2 tabs daily with food x 5ds, then 1 tab daily with food x 5ds then STOP  15 tablet  0   No facility-administered encounter medications on file as of 04/30/2013.

## 2013-05-03 ENCOUNTER — Encounter: Payer: Self-pay | Admitting: Pulmonary Disease

## 2013-05-07 ENCOUNTER — Ambulatory Visit: Payer: BC Managed Care – PPO | Admitting: Pulmonary Disease

## 2013-05-17 ENCOUNTER — Encounter: Payer: Self-pay | Admitting: Unknown Physician Specialty

## 2013-05-30 ENCOUNTER — Telehealth: Payer: Self-pay | Admitting: Pulmonary Disease

## 2013-05-30 MED ORDER — METHOTREXATE SODIUM 15 MG PO TABS: 15.0000 mg | ORAL_TABLET | ORAL | Status: DC

## 2013-05-30 NOTE — Telephone Encounter (Signed)
Per CY- ok to feel 1 time  Spoke with patient, patient aware Rx has been sent to verified pharmacy Nothing further needed at this time

## 2013-05-30 NOTE — Telephone Encounter (Signed)
I spoke with pt and she is needing RX for methotrexate. She is suppose to take this today. She just got it refilled on Monday bc she was leaving for out of town yesterday. She left the bottle at home. Per pt she use to go to [facility] and they RX this for her but since seeing BQ she stated he has asked her if she needed refills to call. BQ was night float last night and tonight. Will forward to doc of the day to see if okay to refill as she is suppose to take this today. Please advise dr. Annamaria Boots thanks  Last OV 04/30/14

## 2013-06-21 ENCOUNTER — Encounter: Payer: Self-pay | Admitting: *Deleted

## 2013-06-24 ENCOUNTER — Telehealth: Payer: Self-pay | Admitting: Pulmonary Disease

## 2013-06-24 ENCOUNTER — Other Ambulatory Visit (INDEPENDENT_AMBULATORY_CARE_PROVIDER_SITE_OTHER): Payer: BC Managed Care – PPO

## 2013-06-24 DIAGNOSIS — Z5181 Encounter for therapeutic drug level monitoring: Secondary | ICD-10-CM

## 2013-06-24 LAB — CBC WITH DIFFERENTIAL/PLATELET
Basophils Absolute: 0 10*3/uL (ref 0.0–0.1)
Basophils Relative: 0 % (ref 0–1)
Eosinophils Absolute: 0.4 10*3/uL (ref 0.0–0.7)
Eosinophils Relative: 5 % (ref 0–5)
HCT: 39.2 % (ref 36.0–46.0)
Hemoglobin: 13.8 g/dL (ref 12.0–15.0)
Lymphocytes Relative: 19 % (ref 12–46)
Lymphs Abs: 1.4 10*3/uL (ref 0.7–4.0)
MCH: 30.1 pg (ref 26.0–34.0)
MCHC: 35.2 g/dL (ref 30.0–36.0)
MCV: 85.6 fL (ref 78.0–100.0)
Monocytes Absolute: 0.5 10*3/uL (ref 0.1–1.0)
Monocytes Relative: 6 % (ref 3–12)
Neutro Abs: 4.8 10*3/uL (ref 1.7–7.7)
Neutrophils Relative %: 70 % (ref 43–77)
Platelets: 312 10*3/uL (ref 150–400)
RBC: 4.58 MIL/uL (ref 3.87–5.11)
RDW: 15.3 % (ref 11.5–15.5)
WBC: 7 10*3/uL (ref 4.0–10.5)

## 2013-06-24 NOTE — Telephone Encounter (Signed)
I spoke with pt. She is needing refill on methotrexate. She stated she transferred to [place] from [facility]. Please advise Dr. Lake Bells if okay to do so thanks

## 2013-06-25 MED ORDER — METHOTREXATE 2.5 MG PO TABS: ORAL_TABLET | ORAL | Status: DC

## 2013-06-25 NOTE — Telephone Encounter (Signed)
Spoke with patient, made her aware Rx will be sent in to verified pharmacy Nothing further needed at this time.

## 2013-06-25 NOTE — Telephone Encounter (Signed)
Spoke with pt and verified that Methotrexate dose is 2.53m 6 tablets weekly.  Rx refill called to pharmacy.

## 2013-06-25 NOTE — Telephone Encounter (Signed)
Please do

## 2013-08-22 ENCOUNTER — Other Ambulatory Visit: Payer: Self-pay

## 2013-08-26 ENCOUNTER — Telehealth: Payer: Self-pay | Admitting: Pulmonary Disease

## 2013-08-26 MED ORDER — METHOTREXATE 2.5 MG PO TABS: ORAL_TABLET | ORAL | Status: DC

## 2013-08-26 NOTE — Telephone Encounter (Signed)
Spoke to pt. Advised her that we called in this rx in 06/2013 with 6 additional refills. [facility] [place] [facility]. I have called in this rx again for the pt. Nothing further was needed.

## 2013-09-17 ENCOUNTER — Ambulatory Visit: Payer: BC Managed Care – PPO | Admitting: Pulmonary Disease

## 2013-09-18 ENCOUNTER — Ambulatory Visit: Payer: Self-pay | Admitting: Ophthalmology

## 2013-09-24 ENCOUNTER — Ambulatory Visit: Payer: Self-pay | Admitting: Pulmonary Disease

## 2013-09-24 LAB — PULMONARY FUNCTION TEST

## 2013-09-25 ENCOUNTER — Encounter: Payer: Self-pay | Admitting: Pulmonary Disease

## 2013-09-25 ENCOUNTER — Telehealth: Payer: Self-pay

## 2013-09-25 NOTE — Telephone Encounter (Signed)
Message copied by CAULFIELD, ASHLEY L on Wed Sep 25, 2013  1:12 PM ------      Message from: Simonne Maffucci B      Created: Wed Sep 25, 2013 10:05 AM       A,            Please let her know that her PFTs were unchanged from prior and we will go over them more next week.            Thanks      B ------

## 2013-09-25 NOTE — Telephone Encounter (Signed)
LMTCB X1 

## 2013-10-01 ENCOUNTER — Encounter: Payer: Self-pay | Admitting: Pulmonary Disease

## 2013-10-01 ENCOUNTER — Ambulatory Visit (INDEPENDENT_AMBULATORY_CARE_PROVIDER_SITE_OTHER): Payer: BC Managed Care – PPO | Admitting: Pulmonary Disease

## 2013-10-01 VITALS — BP 120/64 | HR 91 | Ht 68.0 in | Wt 189.0 lb

## 2013-10-01 DIAGNOSIS — R05 Cough: Secondary | ICD-10-CM

## 2013-10-01 DIAGNOSIS — D869 Sarcoidosis, unspecified: Secondary | ICD-10-CM

## 2013-10-01 DIAGNOSIS — N184 Chronic kidney disease, stage 4 (severe): Secondary | ICD-10-CM

## 2013-10-01 DIAGNOSIS — J99 Respiratory disorders in diseases classified elsewhere: Secondary | ICD-10-CM

## 2013-10-01 DIAGNOSIS — R059 Cough, unspecified: Secondary | ICD-10-CM

## 2013-10-01 DIAGNOSIS — D860 Sarcoidosis of lung: Secondary | ICD-10-CM

## 2013-10-01 MED ORDER — PHENYLEPH-PROMETHAZINE-COD 5-6.25-10 MG/5ML PO SYRP: 5.0000 mL | ORAL_SOLUTION | Freq: Every evening | ORAL | Status: DC | PRN

## 2013-10-01 MED ORDER — PREDNISONE 10 MG PO TABS: ORAL_TABLET | ORAL | Status: DC

## 2013-10-01 NOTE — Progress Notes (Signed)
Subjective:    Patient ID: Stacie [name], female    DOB: [birth year], 57 y.o.   MRN: [id number]   Synopsis: Stacie [name] first saw the [facility] pulmonary clinic in May 2014 for evaluation of sarcoidosis. She was diagnosed with sarcoid in 2009 at the [place] via an EBUS guided fine needle aspiration of a hilar lymph node as well as an endobronchial biopsy which showed noncaseating granulomas with negative special stains for infectious organisms. She was treated off and on with steroids from 2009 2 2014 and then in 2014 was started on methotrexate in addition to Plaquenil. She also has chronic kidney disease which is believed to be directly related to sarcoidosis.  HPI   04/30/2013 ROV >  Since our last visit When suffered an episode of tracheobronchitis which has since improved. She had hoarseness and some vocal cord problems which are improving with care from her ear nose and throat physician as well as speech therapy. She states that her breathing is about the same since the last visit. She still struggles with shortness of breath after climbing 2 flights of stairs or walking lengthy distances. She does walk at least 1 mile for exercise daily on level ground and does not have to stop while doing this. She states that participating in activities of daily living around the house has been pretty much normal. Her cough is improved significantly with the use of tramadol. She continues to take the methotrexate and the Plaquenil.  10/01/2013 ROV >  Stacie [name] has been coughing a lot lately and her family says the same thing. It is worse in the mornings after she wakes up and then again with exercise.  It will occur with just sitting there at rest.  She has been producing more sputum lately creamy in color.  It is not clear.  No blood.  She has had more fatigue lately, some chills.  No chest pain.   The tramadol really helped suppress her cough but it mae her nauseated  where she had to vomit.  Her weight has been stable. She has not been as active because of her shortness of breath and fatigue.  Past Medical History  Diagnosis Date  . Sarcoid      Review of Systems  Constitutional: Positive for fatigue. Negative for fever and chills.  HENT: Negative for congestion, nosebleeds and rhinorrhea.   Respiratory: Positive for shortness of breath. Negative for cough and wheezing.   Cardiovascular: Negative for chest pain, palpitations and leg swelling.       Objective:   Physical Exam  Filed Vitals:   10/01/13 1555  BP: 120/64  [name]: 91  Height: _0  (1.727 m)  Weight: 189 lb (85.73 kg)  SpO2: 97%   Gen: well appearing, no acute distress HEENT: NCAT,  EOMi, OP clear,  PULM: few crackles bilaterally, clear with good air movement otherwise CV: RRR, no mgr, no JVD AB: BS+, soft, nontender, no hsm Ext: warm, no edema, no clubbing, no cyanosis  Diagnosed 2009 [facility] with EBUS FNA of a hilar lymph node as well as an endobronchial lesion showing noncaseating granulomas, special stains were negative for fungal or AFB Prednisone off and on 2003-2014 11/2012 MTX/HCQ started [facility] Kidney involvement 03/12/2013 6 MW > 1500 feet, O2 saturation nadir 88% RA, HR max 132 03/2013 PFT > Ratio 66%, FEV 2.2L (82% pred), TLC 4.62 L (81% pred), DLCO 16.7 68% 09/2013 PFT > ratio 63%, FEV1 1.99> 2.08L with bronchodilator (76% predicted,  5%), TLC 4.25L (72% pred), DLCO 15.5 (64% pred)     Assessment & Plan:   Pulmonary sarcoidosis It bothers me that Kathy's shortness of breath has progressed over the last year and her lung function is slightly worse on the December 2014 study. It does not appear to me that she is making any benefit with the methotrexate/plaquenil combination.  Typically sarcoidosis which has been refractory to methotrexate history did with Imuran. In my mind this is the most appropriate next best step would need to be careful with her underlying chronic  kidney disease. Imuran can have a higher risk of bone marrow suppression if not dosed appropriately and chronic kidney disease. Other options may include IV therapy such as infliximab. I think if we get to that point we will ask her to be seen again at a tertiary care center first.  Plan: -Check TPMT test today to see if she is eligible to take Imuran -Check basic metabolic panel -Steroid taper for 3 weeks starting at 30 mg   CKD Because of possible sarcoid involvement with her chronic kidney disease I have asked that she establish care with a nephrologist here in town. As I understand it from the records at [place] it was felt that her CKD was related to her sarcoidosis. We will check a basic metabolic panel with calcium today.  Cough We will treat for a sarcoid flare with prednisone and I have given her a cough syrup with codeine to use at night.    Updated Medication List Outpatient Encounter Prescriptions as of 10/01/2013  Medication Sig  . albuterol (VENTOLIN HFA) 108 (90 BASE) MCG/ACT inhaler Inhale 2 puffs into the lungs every 6 (six) hours as needed for wheezing.  Marland Kitchen aspirin 81 MG tablet Take 81 mg by mouth daily.  Marland Kitchen GLUCOSAMINE PO Take 1 tablet by mouth daily.  . hydroxychloroquine (PLAQUENIL) 200 MG tablet Take 200 mg by mouth 2 (two) times daily.   . methotrexate (RHEUMATREX) 2.5 MG tablet Take 6 tablets weekly - Caution:Chemotherapy. Protect from light.  . mometasone (NASONEX) 50 MCG/ACT nasal spray Place 1 spray into the nose 2 (two) times daily.  . mometasone-formoterol (DULERA) 100-5 MCG/ACT AERO Inhale 2 puffs into the lungs 2 (two) times daily.  . Multiple Vitamin (MULTIVITAMIN) capsule Take 1 capsule by mouth daily.  . potassium chloride (MICRO-K) 10 MEQ CR capsule Take 10 mEq by mouth 2 (two) times daily.  Marland Kitchen levothyroxine (SYNTHROID, LEVOTHROID) 137 MCG tablet Take 137 mcg by mouth daily before breakfast.  . [DISCONTINUED] fluticasone-salmeterol (ADVAIR HFA)  115-21 MCG/ACT inhaler Inhale 1 puff into the lungs 2 (two) times daily.  . [DISCONTINUED] folic acid (FOLVITE) 1 MG tablet Take 1 tablet (1 mg total) by mouth daily.  . [DISCONTINUED] traMADol (ULTRAM) 50 MG tablet Take 1 tablet (50 mg total) by mouth every 6 (six) hours as needed.

## 2013-10-01 NOTE — Patient Instructions (Signed)
Take the prednisone for three weeks as written I wrote a prescription for a cough syrup with codeine if you need  We will have you get a Chest X-ray at [facility] this week  We will call you with the results of the blood test and tell you how to take the Imuran (Azathioprine)  Call Dr. Holley Raring for an appointment  We will see you back in early February

## 2013-10-02 ENCOUNTER — Other Ambulatory Visit (INDEPENDENT_AMBULATORY_CARE_PROVIDER_SITE_OTHER): Payer: BC Managed Care – PPO

## 2013-10-02 ENCOUNTER — Telehealth: Payer: Self-pay | Admitting: Pulmonary Disease

## 2013-10-02 ENCOUNTER — Telehealth: Payer: Self-pay

## 2013-10-02 ENCOUNTER — Other Ambulatory Visit: Payer: Self-pay | Admitting: *Deleted

## 2013-10-02 ENCOUNTER — Ambulatory Visit (INDEPENDENT_AMBULATORY_CARE_PROVIDER_SITE_OTHER)
Admission: RE | Admit: 2013-10-02 | Discharge: 2013-10-02 | Disposition: A | Discharge status: Home / Self Care | Payer: BC Managed Care – PPO | Source: Ambulatory Visit | Attending: Pulmonary Disease | Admitting: Pulmonary Disease

## 2013-10-02 DIAGNOSIS — D869 Sarcoidosis, unspecified: Secondary | ICD-10-CM

## 2013-10-02 LAB — COMPREHENSIVE METABOLIC PANEL
ALT: 25 U/L (ref 0–35)
AST: 21 U/L (ref 0–37)
Albumin: 3.7 g/dL (ref 3.5–5.2)
Alkaline Phosphatase: 83 U/L (ref 39–117)
BUN: 21 mg/dL (ref 6–23)
CO2: 27 mEq/L (ref 19–32)
Calcium: 9.2 mg/dL (ref 8.4–10.5)
Chloride: 104 mEq/L (ref 96–112)
Creatinine, Ser: 1.1 mg/dL (ref 0.4–1.2)
GFR: 52.48 mL/min — ABNORMAL LOW (ref >60.00)
Glucose, Bld: 84 mg/dL (ref 70–99)
Potassium: 3.5 mEq/L (ref 3.5–5.1)
Sodium: 140 mEq/L (ref 135–145)
Total Bilirubin: 0.5 mg/dL (ref 0.3–1.2)
Total Protein: 6.7 g/dL (ref 6.0–8.3)

## 2013-10-02 LAB — CBC WITH DIFFERENTIAL/PLATELET
Basophils Absolute: 0 10*3/uL (ref 0.0–0.1)
Basophils Relative: 0.6 % (ref 0.0–3.0)
Eosinophils Absolute: 0.4 10*3/uL (ref 0.0–0.7)
Eosinophils Relative: 5.5 % — ABNORMAL HIGH (ref 0.0–5.0)
HCT: 37.3 % (ref 36.0–46.0)
Hemoglobin: 12.8 g/dL (ref 12.0–15.0)
Lymphocytes Relative: 17.6 % (ref 12.0–46.0)
Lymphs Abs: 1.3 10*3/uL (ref 0.7–4.0)
MCHC: 34.2 g/dL (ref 30.0–36.0)
MCV: 88.4 fl (ref 78.0–100.0)
Monocytes Absolute: 0.6 10*3/uL (ref 0.1–1.0)
Monocytes Relative: 8.1 % (ref 3.0–12.0)
Neutro Abs: 4.9 10*3/uL (ref 1.4–7.7)
Neutrophils Relative %: 68.2 % (ref 43.0–77.0)
Platelets: 252 10*3/uL (ref 150.0–400.0)
RBC: 4.22 Mil/uL (ref 3.87–5.11)
RDW: 15.3 % — ABNORMAL HIGH (ref 11.5–14.6)
WBC: 7.1 10*3/uL (ref 4.5–10.5)

## 2013-10-02 NOTE — Addendum Note (Signed)
Addended by: Len Blalock on: 10/02/2013 11:35 AM   Modules accepted: Orders

## 2013-10-02 NOTE — Assessment & Plan Note (Signed)
Because of possible sarcoid involvement with her chronic kidney disease I have asked that she establish care with a nephrologist here in town. As I understand it from the records at [place] it was felt that her CKD was related to her sarcoidosis. We will check a basic metabolic panel with calcium today.

## 2013-10-02 NOTE — Telephone Encounter (Signed)
Patient is aware of cxr results.  She is confused about her Prednisone.  After looking at the triage notes, I am also.  I advised the pt that I would speak with BQ tomorrow when he is in the office and return her call to verify.  Nothing further needed at this time.

## 2013-10-02 NOTE — Telephone Encounter (Signed)
Per 12.16.14 ov w/ BQ:  Patient Instructions     Take the prednisone for three weeks as written  I wrote a prescription for a cough syrup with codeine if you need  We will have you get a Chest X-ray at [place] [facility] this week  We will call you with the results of the blood test and tell you how to take the Imuran (Azathioprine)  Call Dr. Holley Raring for an appointment  We will see you back in early February   Per pt's chart, she is supposed to take Prednisone 9m  3tabs daily x3 weeks, 2 daily x3 weeks then 1 daily x3 [facility] DLinna Hoffwho stated that the quantity rx'd for the prednisone is only enough for 15 days -- will need #126 tabs to complete the rx as written by BQ Gave verbal authorization for this quantity as pt is not due for follow up w/ BQ until February 2015 Will sign and forward to BQ to make him aware.

## 2013-10-02 NOTE — Telephone Encounter (Signed)
Message copied by Len Blalock on Wed Oct 02, 2013  5:04 PM ------      Message from: Simonne Maffucci B      Created: Wed Oct 02, 2013  2:57 PM       A,            Please let her know that her CXR hasn't changed.            Thanks      B ------

## 2013-10-02 NOTE — Assessment & Plan Note (Signed)
It bothers me that Stacie [name]'s shortness of breath has progressed over the last year and her lung function is slightly worse on the December 2014 study. It does not appear to me that she is making any benefit with the methotrexate/plaquenil combination.  Typically sarcoidosis which has been refractory to methotrexate history did with Imuran. In my mind this is the most appropriate next best step would need to be careful with her underlying chronic kidney disease. Imuran can have a higher risk of bone marrow suppression if not dosed appropriately and chronic kidney disease. Other options may include IV therapy such as infliximab. I think if we get to that point we will ask her to be seen again at a tertiary care center first.  Plan: -Check TPMT test today to see if she is eligible to take Imuran -Check basic metabolic panel -Steroid taper for 3 weeks starting at 30 mg

## 2013-10-02 NOTE — Assessment & Plan Note (Signed)
We will treat for a sarcoid flare with prednisone and I have given her a cough syrup with codeine to use at night.

## 2013-10-03 ENCOUNTER — Ambulatory Visit: Payer: Self-pay | Admitting: Internal Medicine

## 2013-10-03 ENCOUNTER — Telehealth: Payer: Self-pay | Admitting: Pulmonary Disease

## 2013-10-03 LAB — HM MAMMOGRAPHY: HM Mammogram: NORMAL

## 2013-10-03 NOTE — Telephone Encounter (Signed)
I see the mistake  She is to take: 77m daily for one week then 250mdaily for one week then 1050maily for one week  Please call to clarify

## 2013-10-03 NOTE — Telephone Encounter (Signed)
Called and spoke with pt and she is aware of lab results per BQ.  Pt voiced her understanding and nothing further is needed.

## 2013-10-03 NOTE — Telephone Encounter (Signed)
lmtcb X1 

## 2013-10-29 ENCOUNTER — Telehealth: Payer: Self-pay

## 2013-10-29 ENCOUNTER — Encounter: Payer: Self-pay | Admitting: Pulmonary Disease

## 2013-10-29 LAB — THIOPURINE METHYLTRANSFERASE (TPMT), RBC

## 2013-10-29 NOTE — Telephone Encounter (Signed)
Message copied by Len Blalock on Tue Oct 29, 2013 10:56 AM ------      Message from: Simonne Maffucci B      Created: Tue Oct 29, 2013  9:03 AM       A,            Can you look into the results of the TPMT testing we sent on 12/16?  I can't find the result.  It is a blood test and I can see the order from the last visit.            Thanks      B ------

## 2013-10-29 NOTE — Telephone Encounter (Signed)
I called [facility], it looks like there was a duplicate order in for the test so they had it listed as "specimen not received".  The lab tech found the results in their system, is faxing those to [place] today.  I'll give those to you as soon as I get them.

## 2013-10-30 ENCOUNTER — Ambulatory Visit (INDEPENDENT_AMBULATORY_CARE_PROVIDER_SITE_OTHER): Payer: BC Managed Care – PPO | Admitting: Pulmonary Disease

## 2013-10-30 ENCOUNTER — Encounter: Payer: Self-pay | Admitting: Pulmonary Disease

## 2013-10-30 VITALS — BP 122/66 | HR 99 | Temp 98.1°F | Ht 68.0 in | Wt 193.0 lb

## 2013-10-30 DIAGNOSIS — Z5181 Encounter for therapeutic drug level monitoring: Secondary | ICD-10-CM

## 2013-10-30 DIAGNOSIS — N184 Chronic kidney disease, stage 4 (severe): Secondary | ICD-10-CM

## 2013-10-30 DIAGNOSIS — J99 Respiratory disorders in diseases classified elsewhere: Secondary | ICD-10-CM

## 2013-10-30 DIAGNOSIS — D860 Sarcoidosis of lung: Secondary | ICD-10-CM

## 2013-10-30 DIAGNOSIS — D869 Sarcoidosis, unspecified: Secondary | ICD-10-CM

## 2013-10-30 MED ORDER — AZATHIOPRINE 50 MG PO TABS: ORAL_TABLET | ORAL | Status: DC

## 2013-10-30 NOTE — Assessment & Plan Note (Signed)
CKD related to sarcoid.  GFR > 50 on last check.  Will need to monitor CBC and CMET closely on imuran given that poor renal excretion can lead to a higher incidence of bone marrow suppression.  Plan: -weekly CMET for 9 weeks

## 2013-10-30 NOTE — Patient Instructions (Signed)
Stop methotrexate and plaquenil Start imuran 22m daily and take it for three weeks After three weeks, take 75 mg daily for three weeks Then take 1082mdaily for three weeks  Then take 12532maily   We will have you get weekly bloodwork each Monday until you get to 125m4mday  We will see you back in 6 weeks or sooner if needed

## 2013-10-30 NOTE — Telephone Encounter (Signed)
Lab results have been received, given to BQ.  Pt has an appt today _0 :15 in [place].  We will discuss these with her today.  Nothing further needed.

## 2013-10-30 NOTE — Assessment & Plan Note (Signed)
Stacie [name] did really well with the prednisone we gave her on the last visit.  However, she clearly can't stay on this since she has already had cataract surgery.  Methotrexate/plaquenil did not help.  We need to switch to imuran. I explained to her that we may need to add a low dose of prednisone as well.  If she does need prednisone then we will need to reconsider the addition of daily atovaquone for PCP prophylaxis.  Plan: -stop methotrexate/plaquenil -start imuran 42m daily -increase imuran by 288mevery 3 weeks until at goal dose of 12511maily (renal adjustment) -monitor cbc/cmet weekly while pushing to goal dose -advised of major side effects: marrow suppression, hepatotoxicity, rash, gi upset -f/u 6 weeks -plan repeat PFT 3-4 months after getting to goal dose

## 2013-10-30 NOTE — Progress Notes (Signed)
Subjective:    Patient ID: Stacie [name], female    DOB: 19-Jun-1956, 58 y.o.   MRN: [id number]   Synopsis: Stacie [name] first saw the [facility] pulmonary clinic in May 2014 for evaluation of sarcoidosis. She was diagnosed with sarcoid in 2009 at the [facility] via an EBUS guided fine needle aspiration of a hilar lymph node as well as an endobronchial biopsy which showed noncaseating granulomas with negative special stains for infectious organisms. She was treated off and on with steroids from 2009 2 2014 and then in 2014 was started on methotrexate in addition to Plaquenil. She also has chronic kidney disease which is believed to be directly related to sarcoidosis.  She had no improvement with methotrexate and plaquenil and her TLC and DLCO declined slightly so in January 2015 she was started on imuran.   HPI   04/30/2013 ROV >  Since our last visit When suffered an episode of tracheobronchitis which has since improved. She had hoarseness and some vocal cord problems which are improving with care from her ear nose and throat physician as well as speech therapy. She states that her breathing is about the same since the last visit. She still struggles with shortness of breath after climbing 2 flights of stairs or walking lengthy distances. She does walk at least 1 mile for exercise daily on level ground and does not have to stop while doing this. She states that participating in activities of daily living around the house has been pretty much normal. Her cough is improved significantly with the use of tramadol. She continues to take the methotrexate and the Plaquenil.  10/01/2013 ROV >  [name] has been coughing a lot lately and her family says the same thing. It is worse in the mornings after she wakes up and then again with exercise.  It will occur with just sitting there at rest.  She has been producing more sputum lately creamy in color.  It is not clear.  No  blood.  She has had more fatigue lately, some chills.  No chest pain.   The tramadol really helped suppress her cough but it mae her nauseated where she had to vomit.  Her weight has been stable. She has not been as active because of her shortness of breath and fatigue.  10/31/2012 ROV > [name] did really well with the prednisone taper we wrote on the last visit.  Her cough, shortness of breath, and fatigue all improved within 24 hours.  She now feels like the dyspnea and cough are starting to pick up since she is at the end of her taper.  She is anxious to try another agent for her sarcoid.  Past Medical History  Diagnosis Date  . Sarcoid      Review of Systems  Constitutional: Negative for fever, chills and fatigue.  HENT: Negative for congestion, nosebleeds and rhinorrhea.   Respiratory: Negative for cough, shortness of breath and wheezing.   Cardiovascular: Negative for chest pain, palpitations and leg swelling.       Objective:   Physical Exam  Filed Vitals:   10/30/13 1507  BP: 122/66  Pulse: 99  Temp: 98.1 F (36.7 C)  TempSrc: Oral  Height: _0  (1.727 m)  Weight: 193 lb (87.544 kg)  SpO2: 95%  RA  Gen: well appearing, no acute distress HEENT: NCAT,  EOMi, OP clear,  PULM: few crackles bilaterally in bases, clear with good air movement otherwise CV: RRR, no mgr,  no JVD AB: BS+, soft, nontender, no hsm Ext: warm, no edema, no clubbing, no cyanosis  Diagnosed 2009 [facility] with EBUS FNA of a hilar lymph node as well as an endobronchial lesion showing noncaseating granulomas, special stains were negative for fungal or AFB Prednisone off and on 2003-2014 11/2012 MTX/HCQ started [facility] Kidney involvement 03/12/2013 6 MW > 1500 feet, O2 saturation nadir 88% RA, HR max 132 03/2013 PFT > Ratio 66%, FEV 2.2L (82% pred), TLC 4.62 L (81% pred), DLCO 16.7 68% 09/2013 PFT > ratio 63%, FEV1 1.99> 2.08L with bronchodilator (76% predicted, 5%), TLC 4.25L (72% pred), DLCO 15.5 (64%  pred) 10/2013 imuran started     Assessment & Plan:   Pulmonary sarcoidosis Stacie [name] did really well with the prednisone we gave her on the last visit.  However, she clearly can't stay on this since she has already had cataract surgery.  Methotrexate/plaquenil did not help.  We need to switch to imuran. I explained to her that we may need to add a low dose of prednisone as well.  If she does need prednisone then we will need to reconsider the addition of daily atovaquone for PCP prophylaxis.  Plan: -stop methotrexate/plaquenil -start imuran 67m daily -increase imuran by 215mevery 3 weeks until at goal dose of 12531maily (renal adjustment) -monitor cbc/cmet weekly while pushing to goal dose -advised of major side effects: marrow suppression, hepatotoxicity, rash, gi upset -f/u 6 weeks -plan repeat PFT 3-4 months after getting to goal dose  CKD CKD related to sarcoid.  GFR > 50 on last check.  Will need to monitor CBC and CMET closely on imuran given that poor renal excretion can lead to a higher incidence of bone marrow suppression.  Plan: -weekly CMET for 9 weeks    Updated Medication List Outpatient Encounter Prescriptions as of 10/30/2013  Medication Sig  . albuterol (VENTOLIN HFA) 108 (90 BASE) MCG/ACT inhaler Inhale 2 puffs into the lungs every 6 (six) hours as needed for wheezing.  . aMarland Kitchenpirin 81 MG tablet Take 81 mg by mouth daily.  . GMarland KitchenUCOSAMINE PO Take 1 tablet by mouth daily.  . hydroxychloroquine (PLAQUENIL) 200 MG tablet Take 200 mg by mouth 2 (two) times daily.   . lMarland Kitchenvothyroxine (SYNTHROID, LEVOTHROID) 137 MCG tablet Take 137 mcg by mouth daily before breakfast.  . methotrexate (RHEUMATREX) 2.5 MG tablet Take 6 tablets weekly - Caution:Chemotherapy. Protect from light.  . mometasone (NASONEX) 50 MCG/ACT nasal spray Place 1 spray into the nose 2 (two) times daily.  . mometasone-formoterol (DULERA) 100-5 MCG/ACT AERO Inhale 2 puffs into the lungs 2 (two) times daily.  .  Multiple Vitamin (MULTIVITAMIN) capsule Take 1 capsule by mouth daily.  . PMarland Kitchenenyleph-Promethazine-Cod (PROMETHAZINE VC/CODEINE) 5-6.25-10 MG/5ML SYRP Take 5 mLs by mouth at bedtime as needed.  . potassium chloride (MICRO-K) 10 MEQ CR capsule Take 10 mEq by mouth 2 (two) times daily.  . predniSONE (DELTASONE) 10 MG tablet Take 40m34mily for three weeks Then take 20mg64mly for three weeks Then take 10mg 81my for three weeks

## 2013-11-04 ENCOUNTER — Other Ambulatory Visit (INDEPENDENT_AMBULATORY_CARE_PROVIDER_SITE_OTHER): Payer: BC Managed Care – PPO

## 2013-11-04 DIAGNOSIS — Z5181 Encounter for therapeutic drug level monitoring: Secondary | ICD-10-CM

## 2013-11-04 LAB — COMPREHENSIVE METABOLIC PANEL WITH GFR
ALT: 24 U/L (ref 0–35)
AST: 23 U/L (ref 0–37)
Albumin: 3.3 g/dL — ABNORMAL LOW (ref 3.5–5.2)
Alkaline Phosphatase: 104 U/L (ref 39–117)
BUN: 16 mg/dL (ref 6–23)
CO2: 31 meq/L (ref 19–32)
Calcium: 9.3 mg/dL (ref 8.4–10.5)
Chloride: 105 meq/L (ref 96–112)
Creatinine, Ser: 1.1 mg/dL (ref 0.4–1.2)
GFR: 56.48 mL/min — ABNORMAL LOW
Glucose, Bld: 106 mg/dL — ABNORMAL HIGH (ref 70–99)
Potassium: 3.7 meq/L (ref 3.5–5.1)
Sodium: 142 meq/L (ref 135–145)
Total Bilirubin: 0.6 mg/dL (ref 0.3–1.2)
Total Protein: 6.8 g/dL (ref 6.0–8.3)

## 2013-11-04 LAB — CBC
HCT: 40.5 % (ref 36.0–46.0)
Hemoglobin: 13.8 g/dL (ref 12.0–15.0)
MCHC: 34 g/dL (ref 30.0–36.0)
MCV: 89.3 fl (ref 78.0–100.0)
Platelets: 259 10*3/uL (ref 150.0–400.0)
RBC: 4.53 Mil/uL (ref 3.87–5.11)
RDW: 15.1 % — ABNORMAL HIGH (ref 11.5–14.6)
WBC: 7.6 10*3/uL (ref 4.5–10.5)

## 2013-11-11 ENCOUNTER — Other Ambulatory Visit (INDEPENDENT_AMBULATORY_CARE_PROVIDER_SITE_OTHER): Payer: BC Managed Care – PPO

## 2013-11-11 ENCOUNTER — Telehealth: Payer: Self-pay

## 2013-11-11 DIAGNOSIS — Z5181 Encounter for therapeutic drug level monitoring: Secondary | ICD-10-CM

## 2013-11-11 LAB — CBC
HCT: 40.2 % (ref 36.0–46.0)
Hemoglobin: 13.6 g/dL (ref 12.0–15.0)
MCHC: 34 g/dL (ref 30.0–36.0)
MCV: 87.9 fl (ref 78.0–100.0)
Platelets: 304 10*3/uL (ref 150.0–400.0)
RBC: 4.57 Mil/uL (ref 3.87–5.11)
RDW: 14.4 % (ref 11.5–14.6)
WBC: 7 10*3/uL (ref 4.5–10.5)

## 2013-11-11 LAB — COMPREHENSIVE METABOLIC PANEL
ALT: 25 U/L (ref 0–35)
AST: 28 U/L (ref 0–37)
Albumin: 3.4 g/dL — ABNORMAL LOW (ref 3.5–5.2)
Alkaline Phosphatase: 91 U/L (ref 39–117)
BUN: 17 mg/dL (ref 6–23)
CO2: 29 mEq/L (ref 19–32)
Calcium: 9.3 mg/dL (ref 8.4–10.5)
Chloride: 104 mEq/L (ref 96–112)
Creatinine, Ser: 1.2 mg/dL (ref 0.4–1.2)
GFR: 50.4 mL/min — ABNORMAL LOW (ref >60.00)
Glucose, Bld: 99 mg/dL (ref 70–99)
Potassium: 3.5 mEq/L (ref 3.5–5.1)
Sodium: 140 mEq/L (ref 135–145)
Total Bilirubin: 0.6 mg/dL (ref 0.3–1.2)
Total Protein: 7.3 g/dL (ref 6.0–8.3)

## 2013-11-11 NOTE — Telephone Encounter (Signed)
Pt aware of lab results, nothing further needed.

## 2013-11-11 NOTE — Telephone Encounter (Signed)
Message copied by Len Blalock on Mon Nov 11, 2013  3:10 PM ------      Message from: Simonne Maffucci B      Created: Mon Nov 11, 2013  1:30 PM       A,      Please let her know her lab work looks great      Thanks      B ------

## 2013-11-13 ENCOUNTER — Telehealth: Payer: Self-pay | Admitting: Pulmonary Disease

## 2013-11-13 NOTE — Telephone Encounter (Signed)
Dr Lake Bells please advise.  Thank you. Stacie [name]

## 2013-11-14 MED ORDER — PREDNISONE 10 MG PO TABS: ORAL_TABLET | ORAL | Status: DC

## 2013-11-14 NOTE — Telephone Encounter (Signed)
Called and spoke with pt and made aware of BQ recs. She voiced her understanding and needed nothing further. RX for pred called in. Nothing further needed

## 2013-11-14 NOTE — Telephone Encounter (Signed)
Pt calling again in ref to previous msg can be reached at [phone].Stacie [name]

## 2013-11-14 NOTE — Telephone Encounter (Signed)
Tell her that we shouldn't push the dose of imuran too quickly, but I want to try to use prednisone as a bridge for a few weeks to see if it helps with her symptoms.  Have her take 63m prednisone daily for 3-4 days, then decrease to 170mdaily until she is up to 1003maily of the imuran.  By that point hopefully we can stop the prednisone altogether  If she needs a new Rx for prednisone 79m76mblets please send her an Rx for #40 79mg78mlets (for the purpose of the Rx write "take 1 tablet daily", but tell her to take it as written above), give 2 refills  Let her know that I'm on nights hence the delayed response

## 2013-11-18 ENCOUNTER — Other Ambulatory Visit (INDEPENDENT_AMBULATORY_CARE_PROVIDER_SITE_OTHER): Payer: BC Managed Care – PPO

## 2013-11-18 DIAGNOSIS — Z5181 Encounter for therapeutic drug level monitoring: Secondary | ICD-10-CM

## 2013-11-18 LAB — COMPREHENSIVE METABOLIC PANEL
ALT: 18 U/L (ref 0–35)
AST: 21 U/L (ref 0–37)
Albumin: 3.9 g/dL (ref 3.5–5.2)
Alkaline Phosphatase: 82 U/L (ref 39–117)
BUN: 22 mg/dL (ref 6–23)
CO2: 29 mEq/L (ref 19–32)
Calcium: 10.1 mg/dL (ref 8.4–10.5)
Chloride: 104 mEq/L (ref 96–112)
Creatinine, Ser: 1.1 mg/dL (ref 0.4–1.2)
GFR: 55.87 mL/min — ABNORMAL LOW (ref >60.00)
Glucose, Bld: 86 mg/dL (ref 70–99)
Potassium: 3.3 mEq/L — ABNORMAL LOW (ref 3.5–5.1)
Sodium: 142 mEq/L (ref 135–145)
Total Bilirubin: 0.5 mg/dL (ref 0.3–1.2)
Total Protein: 8 g/dL (ref 6.0–8.3)

## 2013-11-18 LAB — CBC
HCT: 42.6 % (ref 36.0–46.0)
Hemoglobin: 14 g/dL (ref 12.0–15.0)
MCHC: 32.9 g/dL (ref 30.0–36.0)
MCV: 90.4 fl (ref 78.0–100.0)
Platelets: 403 10*3/uL — ABNORMAL HIGH (ref 150.0–400.0)
RBC: 4.71 Mil/uL (ref 3.87–5.11)
RDW: 14.2 % (ref 11.5–14.6)
WBC: 11 10*3/uL — ABNORMAL HIGH (ref 4.5–10.5)

## 2013-11-19 ENCOUNTER — Telehealth: Payer: Self-pay

## 2013-11-19 NOTE — Telephone Encounter (Signed)
Pt aware of lab results, nothing further needed.

## 2013-11-19 NOTE — Telephone Encounter (Signed)
Message copied by Len Blalock on Tue Nov 19, 2013  1:39 PM ------      Message from: Juanito Doom      Created: Tue Nov 19, 2013  9:10 AM       A,            Please let her know that her labs look good            Thanks      B ------

## 2013-11-25 ENCOUNTER — Other Ambulatory Visit (INDEPENDENT_AMBULATORY_CARE_PROVIDER_SITE_OTHER): Payer: BC Managed Care – PPO

## 2013-11-25 DIAGNOSIS — Z5181 Encounter for therapeutic drug level monitoring: Secondary | ICD-10-CM

## 2013-11-25 LAB — CBC
HCT: 44.2 % (ref 36.0–46.0)
Hemoglobin: 14.6 g/dL (ref 12.0–15.0)
MCHC: 33.2 g/dL (ref 30.0–36.0)
MCV: 90.1 fl (ref 78.0–100.0)
Platelets: 321 10*3/uL (ref 150.0–400.0)
RBC: 4.91 Mil/uL (ref 3.87–5.11)
RDW: 14.3 % (ref 11.5–14.6)
WBC: 11.2 10*3/uL — ABNORMAL HIGH (ref 4.5–10.5)

## 2013-11-25 LAB — COMPREHENSIVE METABOLIC PANEL
ALT: 15 U/L (ref 0–35)
AST: 19 U/L (ref 0–37)
Albumin: 3.7 g/dL (ref 3.5–5.2)
Alkaline Phosphatase: 85 U/L (ref 39–117)
BUN: 22 mg/dL (ref 6–23)
CO2: 28 mEq/L (ref 19–32)
Calcium: 9.5 mg/dL (ref 8.4–10.5)
Chloride: 105 mEq/L (ref 96–112)
Creatinine, Ser: 1.1 mg/dL (ref 0.4–1.2)
GFR: 52.99 mL/min — ABNORMAL LOW (ref >60.00)
Glucose, Bld: 88 mg/dL (ref 70–99)
Potassium: 3.6 mEq/L (ref 3.5–5.1)
Sodium: 142 mEq/L (ref 135–145)
Total Bilirubin: 0.7 mg/dL (ref 0.3–1.2)
Total Protein: 7.5 g/dL (ref 6.0–8.3)

## 2013-11-28 ENCOUNTER — Telehealth: Payer: Self-pay | Admitting: Pulmonary Disease

## 2013-11-28 NOTE — Telephone Encounter (Signed)
Pt had labs drawn on 11-25-13. She has these weekly while increasing her dose of imuran. Pt is asking for results. Dr. Lake Bells is out of town so I will send to doc-of-the day to review. Labs printed. Please advise. Gibson Bing, CMA

## 2013-11-28 NOTE — Telephone Encounter (Signed)
WBC is elevated again around 11,000. Chemistry looks ok. Kidney function is not changed.

## 2013-11-28 NOTE — Telephone Encounter (Signed)
Pt aware of lab results.  Nothing further needed.

## 2013-12-02 ENCOUNTER — Other Ambulatory Visit (INDEPENDENT_AMBULATORY_CARE_PROVIDER_SITE_OTHER): Payer: BC Managed Care – PPO

## 2013-12-02 DIAGNOSIS — Z5181 Encounter for therapeutic drug level monitoring: Secondary | ICD-10-CM

## 2013-12-02 LAB — COMPREHENSIVE METABOLIC PANEL
ALT: 35 U/L (ref 0–35)
AST: 45 U/L — ABNORMAL HIGH (ref 0–37)
Albumin: 3.6 g/dL (ref 3.5–5.2)
Alkaline Phosphatase: 97 U/L (ref 39–117)
BUN: 26 mg/dL — ABNORMAL HIGH (ref 6–23)
CO2: 30 mEq/L (ref 19–32)
Calcium: 9.1 mg/dL (ref 8.4–10.5)
Chloride: 102 mEq/L (ref 96–112)
Creatinine, Ser: 1.1 mg/dL (ref 0.4–1.2)
GFR: 51.92 mL/min — ABNORMAL LOW (ref >60.00)
Glucose, Bld: 110 mg/dL — ABNORMAL HIGH (ref 70–99)
Potassium: 3.4 mEq/L — ABNORMAL LOW (ref 3.5–5.1)
Sodium: 140 mEq/L (ref 135–145)
Total Bilirubin: 0.7 mg/dL (ref 0.3–1.2)
Total Protein: 7.3 g/dL (ref 6.0–8.3)

## 2013-12-02 LAB — CBC
HCT: 44.1 % (ref 36.0–46.0)
Hemoglobin: 14.3 g/dL (ref 12.0–15.0)
MCHC: 32.5 g/dL (ref 30.0–36.0)
MCV: 90.3 fl (ref 78.0–100.0)
Platelets: 257 10*3/uL (ref 150.0–400.0)
RBC: 4.88 Mil/uL (ref 3.87–5.11)
RDW: 14.2 % (ref 11.5–14.6)
WBC: 10.6 10*3/uL — ABNORMAL HIGH (ref 4.5–10.5)

## 2013-12-04 ENCOUNTER — Telehealth: Payer: Self-pay

## 2013-12-04 NOTE — Telephone Encounter (Signed)
Pt aware of lab results, nothing further needed.

## 2013-12-04 NOTE — Telephone Encounter (Signed)
Message copied by Len Blalock on Wed Dec 04, 2013  1:08 PM ------      Message from: Simonne Maffucci B      Created: Mon Dec 02, 2013  7:34 PM       A,            Please let her know that her lab work looked great            Thanks      B ------

## 2013-12-09 ENCOUNTER — Other Ambulatory Visit (INDEPENDENT_AMBULATORY_CARE_PROVIDER_SITE_OTHER): Payer: BC Managed Care – PPO

## 2013-12-09 ENCOUNTER — Encounter: Payer: Self-pay | Admitting: Pulmonary Disease

## 2013-12-09 DIAGNOSIS — Z5181 Encounter for therapeutic drug level monitoring: Secondary | ICD-10-CM

## 2013-12-09 LAB — COMPREHENSIVE METABOLIC PANEL
ALT: 122 U/L — ABNORMAL HIGH (ref 0–35)
AST: 147 U/L — ABNORMAL HIGH (ref 0–37)
Albumin: 3.4 g/dL — ABNORMAL LOW (ref 3.5–5.2)
Alkaline Phosphatase: 147 U/L — ABNORMAL HIGH (ref 39–117)
BUN: 23 mg/dL (ref 6–23)
CO2: 30 mEq/L (ref 19–32)
Calcium: 9.2 mg/dL (ref 8.4–10.5)
Chloride: 107 mEq/L (ref 96–112)
Creatinine, Ser: 1.2 mg/dL (ref 0.4–1.2)
GFR: 49.89 mL/min — ABNORMAL LOW (ref >60.00)
Glucose, Bld: 122 mg/dL — ABNORMAL HIGH (ref 70–99)
Potassium: 4 mEq/L (ref 3.5–5.1)
Sodium: 143 mEq/L (ref 135–145)
Total Bilirubin: 0.6 mg/dL (ref 0.3–1.2)
Total Protein: 7.1 g/dL (ref 6.0–8.3)

## 2013-12-09 LAB — CBC
HCT: 41.8 % (ref 36.0–46.0)
Hemoglobin: 13.7 g/dL (ref 12.0–15.0)
MCHC: 32.7 g/dL (ref 30.0–36.0)
MCV: 90.5 fl (ref 78.0–100.0)
Platelets: 244 10*3/uL (ref 150.0–400.0)
RBC: 4.62 Mil/uL (ref 3.87–5.11)
RDW: 14.1 % (ref 11.5–14.6)
WBC: 7.3 10*3/uL (ref 4.5–10.5)

## 2013-12-10 ENCOUNTER — Telehealth: Payer: Self-pay

## 2013-12-10 NOTE — Telephone Encounter (Signed)
Message copied by Len Blalock on Tue Dec 10, 2013  8:54 AM ------      Message from: Simonne Maffucci B      Created: Mon Dec 09, 2013 10:54 PM       A,            Please call Stacie [name] and tell her that her liver function tests were slightly abnormal on the last test. Please clarify her current dose of imuran and let me know.  Tell her to hold the imuran for a week and continue the prednisone.  We will repeat a CMET in a week.  If it is normal then we will likely restart the imuran at a lower dose.            Thanks      B       ------

## 2013-12-10 NOTE — Telephone Encounter (Signed)
Tell her to take prednisone 70m daily until next week

## 2013-12-10 NOTE — Telephone Encounter (Signed)
Pt has been taking 44m of Imuran daily (1.5 tablets).  I've advised her to stop the Imuran and do the labs next week.  She also hasn't taken her prednisone in over a week.  She still has 571mtablets.  She had wanted me to verify that she starts back up on the prednisone.

## 2013-12-10 NOTE — Telephone Encounter (Signed)
Pt aware of recs.  Nothing further needed.

## 2013-12-16 ENCOUNTER — Encounter: Payer: Self-pay | Admitting: Pulmonary Disease

## 2013-12-16 ENCOUNTER — Other Ambulatory Visit (INDEPENDENT_AMBULATORY_CARE_PROVIDER_SITE_OTHER): Payer: BC Managed Care – PPO

## 2013-12-16 ENCOUNTER — Ambulatory Visit (INDEPENDENT_AMBULATORY_CARE_PROVIDER_SITE_OTHER): Payer: BC Managed Care – PPO | Admitting: Pulmonary Disease

## 2013-12-16 VITALS — BP 134/66 | HR 92 | Temp 97.8°F | Ht 68.0 in | Wt 191.0 lb

## 2013-12-16 DIAGNOSIS — R945 Abnormal results of liver function studies: Secondary | ICD-10-CM

## 2013-12-16 DIAGNOSIS — D869 Sarcoidosis, unspecified: Secondary | ICD-10-CM

## 2013-12-16 DIAGNOSIS — Z5181 Encounter for therapeutic drug level monitoring: Secondary | ICD-10-CM

## 2013-12-16 DIAGNOSIS — R7989 Other specified abnormal findings of blood chemistry: Secondary | ICD-10-CM | POA: Insufficient documentation

## 2013-12-16 DIAGNOSIS — D860 Sarcoidosis of lung: Secondary | ICD-10-CM

## 2013-12-16 DIAGNOSIS — J99 Respiratory disorders in diseases classified elsewhere: Secondary | ICD-10-CM

## 2013-12-16 LAB — COMPREHENSIVE METABOLIC PANEL
ALT: 35 U/L (ref 0–35)
AST: 21 U/L (ref 0–37)
Albumin: 3.5 g/dL (ref 3.5–5.2)
Alkaline Phosphatase: 114 U/L (ref 39–117)
BUN: 25 mg/dL — ABNORMAL HIGH (ref 6–23)
CO2: 29 mEq/L (ref 19–32)
Calcium: 9.6 mg/dL (ref 8.4–10.5)
Chloride: 102 mEq/L (ref 96–112)
Creatinine, Ser: 1.1 mg/dL (ref 0.4–1.2)
GFR: 55.85 mL/min — ABNORMAL LOW (ref >60.00)
Glucose, Bld: 99 mg/dL (ref 70–99)
Potassium: 3.3 mEq/L — ABNORMAL LOW (ref 3.5–5.1)
Sodium: 139 mEq/L (ref 135–145)
Total Bilirubin: 0.5 mg/dL (ref 0.3–1.2)
Total Protein: 7.6 g/dL (ref 6.0–8.3)

## 2013-12-16 LAB — CBC
HCT: 43.1 % (ref 36.0–46.0)
Hemoglobin: 14.3 g/dL (ref 12.0–15.0)
MCHC: 33 g/dL (ref 30.0–36.0)
MCV: 89.4 fl (ref 78.0–100.0)
Platelets: 350 10*3/uL (ref 150.0–400.0)
RBC: 4.83 Mil/uL (ref 3.87–5.11)
RDW: 13.9 % (ref 11.5–14.6)
WBC: 9.6 10*3/uL (ref 4.5–10.5)

## 2013-12-16 NOTE — Assessment & Plan Note (Signed)
Her transaminases and alkaline phosphatase were slightly elevated 3 weeks after increasing Imuran from 50-75 mg. This also occurred a few weeks after taking a week of Macrobid. I talked to a pharmacist at [facility] today about this at length. We feel that the [place] may have interfered with the Imuran and lead to this. If her LFTs today have recovered, then we will resume Imuran at 50 mg daily with plans to slowly increase every 3 weeks as previously scheduled.

## 2013-12-16 NOTE — Patient Instructions (Signed)
Keep taking the prednisone 41m daily [place] for your imuran and liver function tests We will see you back in 6-8 weeks or sooner if needed

## 2013-12-16 NOTE — Assessment & Plan Note (Signed)
This is clearly a challenging case. Stacie [name] pulmonary function testing in 2014 declined despite the use of methotrexate and plaque 1L. During this time she noted a slight increase in symptoms.  Unfortunately, Stacie [name] LFTs were abnormal after several weeks of Imuran. I worry that this may have been complicated by the fact that she was treated with Macrobid for 5 days at the same time that the Imuran dose was increased from 50-75 mg.  Plan: -Followup today his liver function test result -See LFT abnormality below -If LFTs have recovered then resume Imuran 50 mg with prednisone 10 mg -Would continue schedule of increasing Imuran every 3 weeks -Continue LFTs weekly -If we are unable to use Imuran, then we may consider starting an anti-TNF alpha drug. I talked to Stacie [name] today about seeing a sarcoidosis specialist at [facility] if we get to that realm.

## 2013-12-16 NOTE — Progress Notes (Signed)
Subjective:    Patient ID: Stacie [name], female    DOB: 01-10-1956, 58 y.o.   MRN: [id number]   Synopsis: Stacie [name] first saw the [facility] pulmonary clinic in May 2014 for evaluation of sarcoidosis. She was diagnosed with sarcoid in 2009 at the [facility] via an EBUS guided fine needle aspiration of a hilar lymph node as well as an endobronchial biopsy which showed noncaseating granulomas with negative special stains for infectious organisms. She was treated off and on with steroids from 2009 2 2014 and then in 2014 was started on methotrexate in addition to Plaquenil. She also has chronic kidney disease which is believed to be directly related to sarcoidosis.  She had no improvement with methotrexate and plaquenil and her TLC and DLCO declined slightly so in January 2015 she was started on imuran.   HPI   04/30/2013 ROV >  Since our last visit When suffered an episode of tracheobronchitis which has since improved. She had hoarseness and some vocal cord problems which are improving with care from her ear nose and throat physician as well as speech therapy. She states that her breathing is about the same since the last visit. She still struggles with shortness of breath after climbing 2 flights of stairs or walking lengthy distances. She does walk at least 1 mile for exercise daily on level ground and does not have to stop while doing this. She states that participating in activities of daily living around the house has been pretty much normal. Her cough is improved significantly with the use of tramadol. She continues to take the methotrexate and the Plaquenil.  10/01/2013 ROV >  Stacie [name] has been coughing a lot lately and her family says the same thing. It is worse in the mornings after she wakes up and then again with exercise.  It will occur with just sitting there at rest.  She has been producing more sputum lately creamy in color.  It is not clear.  No  blood.  She has had more fatigue lately, some chills.  No chest pain.   The tramadol really helped suppress her cough but it mae her nauseated where she had to vomit.  Her weight has been stable. She has not been as active because of her shortness of breath and fatigue.  10/31/2012 ROV > Stacie [name] did really well with the prednisone taper we wrote on the last visit.  Her cough, shortness of breath, and fatigue all improved within 24 hours.  She now feels like the dyspnea and cough are starting to pick up since she is at the end of her taper.  She is anxious to try another agent for her sarcoid.  12/16/2013 ROV >> Stacie [name] was on imuran 75 mg for about three weeks when we found the elevated liver function tests. She had been treated with macrobid a 5 day course from 2/3 to about 2/7.  She really didn't feel like she has improvedon the imuran, but not worse lately.  Overall she thinks that she is doing pretty good but she has repeated coughs and cold from her grand kids. She started taking the increased dose of imuran on 2/4 (75 mg)  Past Medical History  Diagnosis Date  . Sarcoid      Review of Systems  Constitutional: Negative for fever, chills and fatigue.  HENT: Negative for congestion, nosebleeds and rhinorrhea.   Respiratory: Negative for cough, shortness of breath and wheezing.   Cardiovascular: Negative for chest  pain, palpitations and leg swelling.       Objective:   Physical Exam  Filed Vitals:   12/16/13 0939  BP: 134/66  [name]: 92  Temp: 97.8 F (36.6 C)  TempSrc: Oral  Height: _0  (1.727 m)  Weight: 191 lb (86.637 kg)  SpO2: 97%  RA  Gen: well appearing, no acute distress HEENT: NCAT,  EOMi, OP clear,  PULM: few crackles bilaterally in bases, clear with good air movement otherwise CV: RRR, no mgr, no JVD AB: BS+, soft, nontender, no hsm Ext: warm, no edema, no clubbing, no cyanosis  Diagnosed 2009 [facility] with EBUS FNA of a hilar lymph node as well as an endobronchial lesion  showing noncaseating granulomas, special stains were negative for fungal or AFB Prednisone off and on 2003-2014 11/2012 MTX/HCQ started [facility] Kidney involvement 03/12/2013 6 MW > 1500 feet, O2 saturation nadir 88% RA, HR max 132 03/2013 PFT > Ratio 66%, FEV 2.2L (82% pred), TLC 4.62 L (81% pred), DLCO 16.7 68% 09/2013 PFT > ratio 63%, FEV1 1.99> 2.08L with bronchodilator (76% predicted, 5%), TLC 4.25L (72% pred), DLCO 15.5 (64% pred) 10/2013 imuran started     Assessment & Plan:   Pulmonary sarcoidosis This is clearly a challenging case. Her pulmonary function testing in 2014 declined despite the use of methotrexate and plaque 1L. During this time she noted a slight increase in symptoms.  Unfortunately, her LFTs were abnormal after several weeks of Imuran. I worry that this may have been complicated by the fact that she was treated with Macrobid for 5 days at the same time that the Imuran dose was increased from 50-75 mg.  Plan: -Followup today his liver function test result -See LFT abnormality below -If LFTs have recovered then resume Imuran 50 mg with prednisone 10 mg -Would continue schedule of increasing Imuran every 3 weeks -Continue LFTs weekly -If we are unable to use Imuran, then we may consider starting an anti-TNF alpha drug. I talked to her today about seeing a sarcoidosis specialist at [facility] if we get to that realm.  Abnormal LFTs Her transaminases and alkaline phosphatase were slightly elevated 3 weeks after increasing Imuran from 50-75 mg. This also occurred a few weeks after taking a week of Macrobid. I talked to a pharmacist at [facility] today about this at length. We feel that the [place] may have interfered with the Imuran and lead to this. If her LFTs today have recovered, then we will resume Imuran at 50 mg daily with plans to slowly increase every 3 weeks as previously scheduled.    Updated Medication List Outpatient Encounter Prescriptions as of 12/16/2013   Medication Sig  . albuterol (VENTOLIN HFA) 108 (90 BASE) MCG/ACT inhaler Inhale 2 puffs into the lungs every 6 (six) hours as needed for wheezing.  Marland Kitchen aspirin 81 MG tablet Take 81 mg by mouth daily.  Marland Kitchen GLUCOSAMINE PO Take 1 tablet by mouth daily.  Marland Kitchen levothyroxine (SYNTHROID, LEVOTHROID) 137 MCG tablet Take 137 mcg by mouth daily before breakfast.  . mometasone (NASONEX) 50 MCG/ACT nasal spray Place 1 spray into the nose 2 (two) times daily.  . mometasone-formoterol (DULERA) 100-5 MCG/ACT AERO Inhale 2 puffs into the lungs 2 (two) times daily.  . Multiple Vitamin (MULTIVITAMIN) capsule Take 1 capsule by mouth daily.  Marland Kitchen Phenyleph-Promethazine-Cod (PROMETHAZINE VC/CODEINE) 5-6.25-10 MG/5ML SYRP Take 5 mLs by mouth at bedtime as needed.  . potassium chloride (MICRO-K) 10 MEQ CR capsule Take 10 mEq by mouth 2 (two) times  daily.  . predniSONE (DELTASONE) 10 MG tablet Take 1 tablet daily  . azaTHIOprine (IMURAN) 50 MG tablet 19m daily for 3 weeks, then 742mdaily for 3 weeks, then 10047maily until you see me next

## 2013-12-17 ENCOUNTER — Telehealth: Payer: Self-pay

## 2013-12-17 NOTE — Telephone Encounter (Signed)
lmtcb X1 to relay info

## 2013-12-17 NOTE — Telephone Encounter (Signed)
Message copied by Len Blalock on Tue Dec 17, 2013  8:52 AM ------      Message from: Juanito Doom      Created: Tue Dec 17, 2013  6:05 AM       A,      Please let her know that her lab work was back to normal and that this means I want her to start Imuran back at 55m with the same schedule we laid out before (increase every 3 weeks by 25 mg).      Also please tell her I discussed this with a pharmacist and they think that the macrobid could have contributed to the lab abnormality so they feel it would be safe to restart imuran, but she should avoid macrobid.      Thanks      B ------

## 2013-12-17 NOTE — Telephone Encounter (Signed)
Patient is aware of results and recs.  She wants to know if she can stop the prednisone yet, or how much longer she needs to remain on that.  Thanks, Caryl Pina

## 2013-12-17 NOTE — Telephone Encounter (Signed)
Pt aware of recs.  Nothing further needed.

## 2013-12-17 NOTE — Telephone Encounter (Signed)
Let's do prednisone until the end of the week, then off

## 2013-12-23 ENCOUNTER — Other Ambulatory Visit (INDEPENDENT_AMBULATORY_CARE_PROVIDER_SITE_OTHER): Payer: BC Managed Care – PPO

## 2013-12-23 ENCOUNTER — Telehealth: Payer: Self-pay | Admitting: Internal Medicine

## 2013-12-23 ENCOUNTER — Encounter: Payer: Self-pay | Admitting: Pulmonary Disease

## 2013-12-23 DIAGNOSIS — D860 Sarcoidosis of lung: Secondary | ICD-10-CM

## 2013-12-23 DIAGNOSIS — Z5181 Encounter for therapeutic drug level monitoring: Secondary | ICD-10-CM

## 2013-12-23 LAB — COMPREHENSIVE METABOLIC PANEL
ALT: 55 U/L — ABNORMAL HIGH (ref 0–35)
AST: 40 U/L — ABNORMAL HIGH (ref 0–37)
Albumin: 4 g/dL (ref 3.5–5.2)
Alkaline Phosphatase: 117 U/L (ref 39–117)
BUN: 26 mg/dL — ABNORMAL HIGH (ref 6–23)
CO2: 26 mEq/L (ref 19–32)
Calcium: 10 mg/dL (ref 8.4–10.5)
Chloride: 101 mEq/L (ref 96–112)
Creatinine, Ser: 1.3 mg/dL — ABNORMAL HIGH (ref 0.4–1.2)
GFR: 43.83 mL/min — ABNORMAL LOW (ref >60.00)
Glucose, Bld: 118 mg/dL — ABNORMAL HIGH (ref 70–99)
Potassium: 3.9 mEq/L (ref 3.5–5.1)
Sodium: 138 mEq/L (ref 135–145)
Total Bilirubin: 1 mg/dL (ref 0.3–1.2)
Total Protein: 8.2 g/dL (ref 6.0–8.3)

## 2013-12-23 LAB — CBC
HCT: 49.7 % — ABNORMAL HIGH (ref 36.0–46.0)
Hemoglobin: 16.4 g/dL — ABNORMAL HIGH (ref 12.0–15.0)
MCHC: 33.1 g/dL (ref 30.0–36.0)
MCV: 88.4 fl (ref 78.0–100.0)
Platelets: 371 10*3/uL (ref 150.0–400.0)
RBC: 5.62 Mil/uL — ABNORMAL HIGH (ref 3.87–5.11)
RDW: 13.8 % (ref 11.5–14.6)
WBC: 10.4 10*3/uL (ref 4.5–10.5)

## 2013-12-23 NOTE — Telephone Encounter (Signed)
Then she needs to have them done on Friday, please order thanks

## 2013-12-23 NOTE — Telephone Encounter (Signed)
Pt wanted to let dr Lake Bells know that she will be out of town next week and will not be able to get her labs done

## 2013-12-23 NOTE — Telephone Encounter (Signed)
Noted.  Will forward to Dr. Lake Bells as an Juluis Rainier.

## 2013-12-24 ENCOUNTER — Telehealth: Payer: Self-pay

## 2013-12-24 ENCOUNTER — Telehealth: Payer: Self-pay | Admitting: Pulmonary Disease

## 2013-12-24 DIAGNOSIS — D869 Sarcoidosis, unspecified: Secondary | ICD-10-CM

## 2013-12-24 NOTE — Telephone Encounter (Signed)
I called Stacie [name] to let her know that her LFT's showed a mild transaminitis yesterday.  We can no longer use imuran.    I explained to her that I think the best option is to start Infliximab injections.  I would like for her to see Dr. Lenise Arena at [facility] as well as Dr. Cristi Loron in [place] to facilitate injections.    We will make these referrals.  I have asked her to take 12m prednisone daily until she starts the injections.  MJillyn HiddenPCCM Pager: [phone]: [phone] no response, call [phone]

## 2013-12-24 NOTE — Telephone Encounter (Signed)
Pt is aware to have blood work completed on Friday. She will be back in town on Wednesday, she will her blood work by Friday. Orders will be placed per BQ's last OV note.

## 2013-12-24 NOTE — Telephone Encounter (Signed)
BQ is calling this pt himself.  Nothing further needed.

## 2013-12-24 NOTE — Telephone Encounter (Signed)
Message copied by Len Blalock on Tue Dec 24, 2013  8:51 AM ------      Message from: Simonne Maffucci B      Created: Mon Dec 23, 2013  7:19 PM       A,            Please let her know that her labs were abnormal again due to the imuran.  She has to stop it now.  Let her know that she and I need to talk about the next step on the phone before she goes out of town.  Have her give me a time and number.            Thanks      B ------

## 2014-01-03 ENCOUNTER — Telehealth: Payer: Self-pay | Admitting: Pulmonary Disease

## 2014-01-03 DIAGNOSIS — D860 Sarcoidosis of lung: Secondary | ICD-10-CM

## 2014-01-03 NOTE — Telephone Encounter (Signed)
Spoke with Dr Lake Bells  He asks that we refer the pt to [facility]- Dr Gwyneth Sprout for second opinion on Sarcoid  He states that per Dr Wynonia Lawman, we can call [phone] and leave detailed msg with pt's information and his office will call with appt  I advised will send order to [facility] with this info  I spoke with Suanne Marker verbally regarding this as well as sending order to [facility]

## 2014-01-06 ENCOUNTER — Other Ambulatory Visit (INDEPENDENT_AMBULATORY_CARE_PROVIDER_SITE_OTHER): Payer: BC Managed Care – PPO

## 2014-01-06 DIAGNOSIS — Z5181 Encounter for therapeutic drug level monitoring: Secondary | ICD-10-CM

## 2014-01-06 DIAGNOSIS — D860 Sarcoidosis of lung: Secondary | ICD-10-CM

## 2014-01-06 LAB — COMPREHENSIVE METABOLIC PANEL
ALT: 21 U/L (ref 0–35)
AST: 22 U/L (ref 0–37)
Albumin: 3.5 g/dL (ref 3.5–5.2)
Alkaline Phosphatase: 88 U/L (ref 39–117)
BUN: 24 mg/dL — ABNORMAL HIGH (ref 6–23)
CO2: 30 mEq/L (ref 19–32)
Calcium: 9.1 mg/dL (ref 8.4–10.5)
Chloride: 104 mEq/L (ref 96–112)
Creatinine, Ser: 1.1 mg/dL (ref 0.4–1.2)
GFR: 51.9 mL/min — ABNORMAL LOW (ref >60.00)
Glucose, Bld: 132 mg/dL — ABNORMAL HIGH (ref 70–99)
Potassium: 3.2 mEq/L — ABNORMAL LOW (ref 3.5–5.1)
Sodium: 142 mEq/L (ref 135–145)
Total Bilirubin: 0.7 mg/dL (ref 0.3–1.2)
Total Protein: 6.9 g/dL (ref 6.0–8.3)

## 2014-01-06 LAB — HEPATIC FUNCTION PANEL
ALT: 21 U/L (ref 0–35)
AST: 22 U/L (ref 0–37)
Albumin: 3.5 g/dL (ref 3.5–5.2)
Alkaline Phosphatase: 88 U/L (ref 39–117)
Bilirubin, Direct: 0 mg/dL (ref 0.0–0.3)
Total Bilirubin: 0.7 mg/dL (ref 0.3–1.2)
Total Protein: 6.9 g/dL (ref 6.0–8.3)

## 2014-01-06 LAB — CBC
HCT: 40.5 % (ref 36.0–46.0)
Hemoglobin: 13.6 g/dL (ref 12.0–15.0)
MCHC: 33.6 g/dL (ref 30.0–36.0)
MCV: 88.8 fl (ref 78.0–100.0)
Platelets: 251 10*3/uL (ref 150.0–400.0)
RBC: 4.57 Mil/uL (ref 3.87–5.11)
RDW: 13.7 % (ref 11.5–14.6)
WBC: 10 10*3/uL (ref 4.5–10.5)

## 2014-01-07 ENCOUNTER — Telehealth: Payer: Self-pay

## 2014-01-07 NOTE — Telephone Encounter (Signed)
Pt aware of results and recs.  Nothing further needed.

## 2014-01-07 NOTE — Telephone Encounter (Signed)
Message copied by Len Blalock on Tue Jan 07, 2014  9:01 AM ------      Message from: Juanito Doom      Created: Tue Jan 07, 2014  7:09 AM       A,            Please let her know that her labs were OK            Also, she doesn't need them any more now that she is off of the imuran            Thanks      B ------

## 2014-01-10 ENCOUNTER — Telehealth: Payer: Self-pay | Admitting: *Deleted

## 2014-01-10 DIAGNOSIS — D860 Sarcoidosis of lung: Secondary | ICD-10-CM

## 2014-01-10 NOTE — Telephone Encounter (Signed)
Pt is coming in for labs on  Monday what labs and dx?

## 2014-01-10 NOTE — Telephone Encounter (Signed)
Cbc and CMET  Diagnosis: sarcoidosis and therapeutic drug monitoring

## 2014-01-10 NOTE — Telephone Encounter (Signed)
Labs have been ordered

## 2014-01-13 ENCOUNTER — Other Ambulatory Visit: Payer: BC Managed Care – PPO

## 2014-01-20 ENCOUNTER — Other Ambulatory Visit: Payer: BC Managed Care – PPO

## 2014-01-20 ENCOUNTER — Telehealth: Payer: Self-pay | Admitting: Pulmonary Disease

## 2014-01-20 NOTE — Telephone Encounter (Signed)
[name], CMA at 01/03/2014 4:48 PM     Status: Signed        Spoke with Dr Lake Bells  He asks that we refer the pt to [facility]- Dr Gwyneth Sprout for second opinion on Sarcoid  He states that per Dr Wynonia Lawman, we can call [phone] and leave detailed msg with pt's information and his office will call with appt  I advised will send order to [facility] with this info  I spoke with Suanne Marker verbally regarding this as well as [facility]  -- General 01/10/2014 9:31 AM COBB, RHONDA J        Note    Stacie [name] called from Dr. Richardson Landry Tilley's office to arrange appointment for patient. Appointment scheduled for Monday 02/10/14 at 1:40. Pt to arrive at 1:10. Phone # to Alyse Low is [phone]. Referral and notes,labs, pft, faxed to [phone]. Called and spoke with patient and she is aware of appointment and aware that Dr. Thurman Coyer office will mail her a packet of information including directions to the clinic. [place] spoke with pt. She reports she received office address and it says she will be going to [place] to see a Dr. Wynonia Lawman. Pt was under the impression she was going to be seen in [place] to be seen. Please advise Dr. Lake Bells thanks

## 2014-01-20 NOTE — Telephone Encounter (Signed)
Spoke with the pt and notified of recs per BQ  She verbalized understanding  Nothing further needed

## 2014-01-20 NOTE — Telephone Encounter (Signed)
He has an office in [place] too

## 2014-02-11 ENCOUNTER — Telehealth: Payer: Self-pay | Admitting: Pulmonary Disease

## 2014-02-11 DIAGNOSIS — D860 Sarcoidosis of lung: Secondary | ICD-10-CM

## 2014-02-11 NOTE — Telephone Encounter (Signed)
Pt called back-she states she seen Dr Wynonia Lawman yesterday and he is suggesting that pt comes back to see BQ before 1 month and to also have PFT and CXR done prior to the visit(more for a baseline). Pt states Dr Wynonia Lawman dx'd her with asthma and gave her Qvar 40 2 puffs BID. He also had her restart her Plaquenil 257m at 1 po BID. [place].  Pt is to start the Prednisone taper(given by Dr TWynonia Lawmanyesterday) in 1 month from 02/10/14.  Dr TWynonia Lawmanalso informed patient that she no longer needed to follow up with him;to just continue seeing BQ.   BQ, please advise. Thanks

## 2014-02-11 NOTE — Telephone Encounter (Signed)
BQ, do you want to see patient first then order CXR and PFT if needed? Or have them done prior to visit (PFT at [facility]?) Thanks :)

## 2014-02-11 NOTE — Telephone Encounter (Signed)
LMTC x 1

## 2014-02-11 NOTE — Telephone Encounter (Signed)
I'm happy to see her first available with me She is already on Advair, so I don't know what QVar is going to add

## 2014-02-12 ENCOUNTER — Encounter: Payer: Self-pay | Admitting: Pulmonary Disease

## 2014-02-12 NOTE — Telephone Encounter (Signed)
Please have them done at [facility] prior to my visit

## 2014-02-12 NOTE — Telephone Encounter (Signed)
Spoke with pt, she is scheduld for 02/24/14 in [place] at 2:15.  Also placed order for cxr 2 views and pft at [facility] prior to visit.  Nothing further needed.

## 2014-02-24 ENCOUNTER — Encounter: Payer: Self-pay | Admitting: Pulmonary Disease

## 2014-02-24 ENCOUNTER — Telehealth: Payer: Self-pay | Admitting: Pulmonary Disease

## 2014-02-24 ENCOUNTER — Ambulatory Visit (INDEPENDENT_AMBULATORY_CARE_PROVIDER_SITE_OTHER): Payer: BC Managed Care – PPO | Admitting: Pulmonary Disease

## 2014-02-24 ENCOUNTER — Ambulatory Visit: Payer: Self-pay | Admitting: Pulmonary Disease

## 2014-02-24 VITALS — BP 126/68 | HR 97 | Ht 68.0 in | Wt 206.0 lb

## 2014-02-24 DIAGNOSIS — D860 Sarcoidosis of lung: Secondary | ICD-10-CM

## 2014-02-24 DIAGNOSIS — D869 Sarcoidosis, unspecified: Secondary | ICD-10-CM

## 2014-02-24 DIAGNOSIS — J99 Respiratory disorders in diseases classified elsewhere: Secondary | ICD-10-CM

## 2014-02-24 DIAGNOSIS — J45909 Unspecified asthma, uncomplicated: Secondary | ICD-10-CM | POA: Insufficient documentation

## 2014-02-24 LAB — PULMONARY FUNCTION TEST

## 2014-02-24 NOTE — Telephone Encounter (Signed)
CXR Order faxed to [facility] to [phone]. Confirmation received. Rhonda J Cobb

## 2014-02-24 NOTE — Patient Instructions (Signed)
Taper the prednisone off as Dr. Wynonia Lawman recommended I can refill the QVar  Use Zyrtec and saline rinses on the days that the sinus congestion is worse We will see you back in 6 months or sooner if needed

## 2014-02-24 NOTE — Assessment & Plan Note (Signed)
Continue Qvar for Asthma Advair on hold

## 2014-02-24 NOTE — Assessment & Plan Note (Signed)
Stacie [name]'s PFTs today look a little better than the last visit.    Further, her chest x-ray today looks a lot better than it did back in 2011 2012. I'm hoping this means that she's had a response to therapy.  Dr. Wynonia Lawman with [facility] felt like her symptoms were due to asthma more than sarcoid.  Plan: -Continue plaquenil. -Wean prednisone off over the course of the next 3 months -Return to see me in 3 months -Repeat PFT in 6 months

## 2014-02-24 NOTE — Progress Notes (Signed)
Subjective:    Patient ID: Stacie [name], female    DOB: 01-19-1956, 58 y.o.   MRN: [id number]   Synopsis: Stacie [name] first saw the [facility] pulmonary clinic in May 2014 for evaluation of sarcoidosis. She was diagnosed with sarcoid in 2009 at the [facility] via an EBUS guided fine needle aspiration of a hilar lymph node as well as an endobronchial biopsy which showed noncaseating granulomas with negative special stains for infectious organisms. She was treated off and on with steroids from 2009 2 2014 and then in 2014 was started on methotrexate in addition to Plaquenil. She also has chronic kidney disease which is believed to be directly related to sarcoidosis.  She had no improvement with methotrexate and plaquenil and her TLC and DLCO declined slightly so in January 2015 she was started on imuran but this had to be held because of increasing LFTs. She ended up seeing Dr. Lenise Arena at [facility] in 01/2014 for her sarcoid.  He felt like her symptoms were more due to Asthma than Sarcoid.  Plaquenil was restarted, prednisone tapered, and Advair changed to QVar.  HPI    02/24/2014 ROV >> She saw Dr. Lenise Arena at [facility] in 01/2014 for her sarcoid.  He felt like her symptoms were more due to Asthma than Sarcoid.  Plaquenil was restarted, prednisone tapered, and Advair changed to QVar. Since then she says that she's had a lot of allergies with sinus congestion and some chest congestion but in general things are any worse. Some days she's had more shortness of breath which she attributes to congestion. Otherwise, she's been doing OK. She had chest x-ray today which looked good. No fevers or chills.   Past Medical History  Diagnosis Date  . Sarcoid      Review of Systems  Constitutional: Negative for fever, chills and fatigue.  HENT: Positive for postnasal drip, rhinorrhea and sinus pressure. Negative for congestion and nosebleeds.   Respiratory: Negative  for cough, shortness of breath and wheezing.   Cardiovascular: Negative for chest pain, palpitations and leg swelling.       Objective:   Physical Exam  Filed Vitals:   02/24/14 1431  BP: 126/68  Pulse: 97  Height: _0  (1.727 m)  Weight: 206 lb (93.441 kg)  SpO2: 96%  RA  Gen: well appearing, no acute distress HEENT: NCAT, EOMi, OP clear,  PULM: few crackles bilaterally in bases, clear with good air movement otherwise CV: RRR, no mgr, no JVD AB: BS+, soft, nontender, no hsm Ext: warm, no edema, no clubbing, no cyanosis  Diagnosed 2009 [facility] with EBUS FNA of a hilar lymph node as well as an endobronchial lesion showing noncaseating granulomas, special stains were negative for fungal or AFB Prednisone off and on 2003-2014 11/2012 MTX/HCQ started [facility] Kidney involvement 03/12/2013 6 MW > 1500 feet, O2 saturation nadir 88% RA, HR max 132 03/2013 PFT > Ratio 66%, FEV 2.2L (82% pred), TLC 4.62 L (81% pred), DLCO 16.7 68% 09/2013 PFT > ratio 63%, FEV1 1.99> 2.08L with bronchodilator (76% predicted, 5%), TLC 4.25L (72% pred), DLCO 15.5 (64% pred) 10/2013 imuran started 09/2013 TPMT activity 19 > normal 10/2013 Stop MTX, start Imuran> goal dose is 133m/day (renal adjustment); plan start 566mdaily, increase 2562mvery 3 weeks; weekly CBC/CMET until 125m19my, then q-12 weeks;  02/24/2014 CXR> improved bilateral airspace disease, upper lobe scarring noted 02/24/2014 PFT> Ratio FEV1 1.99L (75% pred, 12% change with BD), TLC 4.04 L (71% pred),  DLCO 16.9 (71% pred)      Assessment & Plan:   Pulmonary sarcoidosis Stacie [name]'s PFTs today look a little better than the last visit.    Further, her chest x-ray today looks a lot better than it did back in 2011 2012. I'm hoping this means that she's had a response to therapy.  Dr. Wynonia Lawman with [facility] felt like her symptoms were due to asthma more than sarcoid.  Plan: -Continue plaquenil. -Wean prednisone off over the course of the next 3  months -Return to see me in 3 months -Repeat PFT in 6 months  Asthma, chronic Continue Qvar for Asthma Advair on hold    Updated Medication List Outpatient Encounter Prescriptions as of 02/24/2014  Medication Sig  . albuterol (VENTOLIN HFA) 108 (90 BASE) MCG/ACT inhaler Inhale 2 puffs into the lungs every 6 (six) hours as needed for wheezing.  Marland Kitchen aspirin 81 MG tablet Take 81 mg by mouth daily.  . beclomethasone (QVAR) 80 MCG/ACT inhaler Inhale 2 puffs into the lungs 2 (two) times daily.  Marland Kitchen GLUCOSAMINE PO Take 1 tablet by mouth daily.  . hydroxychloroquine (PLAQUENIL) 200 MG tablet Take 200 mg by mouth 2 (two) times daily.  Marland Kitchen levothyroxine (SYNTHROID, LEVOTHROID) 137 MCG tablet Take 137 mcg by mouth daily before breakfast.  . mometasone (NASONEX) 50 MCG/ACT nasal spray Place 1 spray into the nose 2 (two) times daily.  . Multiple Vitamin (MULTIVITAMIN) capsule Take 1 capsule by mouth daily.  Marland Kitchen Phenyleph-Promethazine-Cod (PROMETHAZINE VC/CODEINE) 5-6.25-10 MG/5ML SYRP Take 5 mLs by mouth at bedtime as needed.  . potassium chloride (MICRO-K) 10 MEQ CR capsule Take 10 mEq by mouth 2 (two) times daily.  . predniSONE (DELTASONE) 10 MG tablet Take 1 tablet daily  . [DISCONTINUED] azaTHIOprine (IMURAN) 50 MG tablet 51m daily for 3 weeks, then 728mdaily for 3 weeks, then 10034maily until you see me next  . [DISCONTINUED] mometasone-formoterol (DULERA) 100-5 MCG/ACT AERO Inhale 2 puffs into the lungs 2 (two) times daily.

## 2014-03-26 ENCOUNTER — Encounter: Payer: Self-pay | Admitting: Pulmonary Disease

## 2014-04-16 ENCOUNTER — Encounter: Payer: Self-pay | Admitting: Pulmonary Disease

## 2014-05-14 ENCOUNTER — Encounter: Payer: Self-pay | Admitting: Pulmonary Disease

## 2014-05-14 ENCOUNTER — Ambulatory Visit (INDEPENDENT_AMBULATORY_CARE_PROVIDER_SITE_OTHER): Payer: BC Managed Care – PPO | Admitting: Pulmonary Disease

## 2014-05-14 ENCOUNTER — Telehealth: Payer: Self-pay

## 2014-05-14 VITALS — BP 124/68 | HR 104 | Ht 68.0 in | Wt 202.0 lb

## 2014-05-14 DIAGNOSIS — J4540 Moderate persistent asthma, uncomplicated: Secondary | ICD-10-CM

## 2014-05-14 DIAGNOSIS — D869 Sarcoidosis, unspecified: Secondary | ICD-10-CM

## 2014-05-14 DIAGNOSIS — D860 Sarcoidosis of lung: Secondary | ICD-10-CM

## 2014-05-14 DIAGNOSIS — J99 Respiratory disorders in diseases classified elsewhere: Secondary | ICD-10-CM

## 2014-05-14 DIAGNOSIS — J45909 Unspecified asthma, uncomplicated: Secondary | ICD-10-CM

## 2014-05-14 NOTE — Assessment & Plan Note (Signed)
This has been a stable interval for Stacie [name] off of prednisone.  She has been feeling well and she is staying active.    Plan: -PFT and CXR in November and f/u with me after that

## 2014-05-14 NOTE — Progress Notes (Signed)
Subjective:    Patient ID: Stacie [name], female    DOB: Apr 09, 1956, 58 y.o.   MRN: [id number]   Synopsis: Stacie [name] first saw the [facility] pulmonary clinic in May 2014 for evaluation of sarcoidosis. She was diagnosed with sarcoid in 2009 at the [facility] via an EBUS guided fine needle aspiration of a hilar lymph node as well as an endobronchial biopsy which showed noncaseating granulomas with negative special stains for infectious organisms. She was treated off and on with steroids from 2009 2 2014 and then in 2014 was started on methotrexate in addition to Plaquenil. She also has chronic kidney disease which is believed to be directly related to sarcoidosis.  She had no improvement with methotrexate and plaquenil and her TLC and DLCO declined slightly so in January 2015 she was started on imuran but this had to be held because of increasing LFTs. She ended up seeing Dr. Lenise Arena at [facility] in 01/2014 for her sarcoid.  He felt like her symptoms were more due to Asthma than Sarcoid.  Plaquenil was restarted, prednisone tapered, and Advair changed to QVar.  HPI   05/14/2014 ROV > Stacie [name] has been doing well.  She says that her breathing is fine, definitely no worse since stopping prednisone.  She is walking for exercise but she gets frustrated when she walks with a friend because she can't talk.  She doesn't cough only when she runs quickly, but rarely otherwise.  Occasionally she has an allergic post nasal drip.  She wants to try to minimize her meds. She has rarely used her albuterol but she has been using her QVar regularly.     Past Medical History  Diagnosis Date  . Sarcoid      Review of Systems  Constitutional: Negative for fever, chills and fatigue.  HENT: Negative for congestion, nosebleeds, postnasal drip, rhinorrhea and sinus pressure.   Respiratory: Negative for cough, shortness of breath and wheezing.   Cardiovascular: Negative for  chest pain, palpitations and leg swelling.       Objective:   Physical Exam  Filed Vitals:   05/14/14 1330  BP: 124/68  Pulse: 104  Height: _0  (1.727 m)  Weight: 202 lb (91.627 kg)  SpO2: 97%  RA  Gen: well appearing, no acute distress HEENT: NCAT, EOMi, OP clear,  PULM: few crackles bilaterally in bases, clear with good air movement otherwise CV: RRR, no mgr, no JVD AB: BS+, soft, nontender, no hsm Ext: warm, no edema, no clubbing, no cyanosis  Diagnosed 2009 [facility] with EBUS FNA of a hilar lymph node as well as an endobronchial lesion showing noncaseating granulomas, special stains were negative for fungal or AFB Prednisone off and on 2003-2014 11/2012 MTX/HCQ started [facility] Kidney involvement 03/12/2013 6 MW > 1500 feet, O2 saturation nadir 88% RA, HR max 132 03/2013 PFT > Ratio 66%, FEV 2.2L (82% pred), TLC 4.62 L (81% pred), DLCO 16.7 68% 09/2013 PFT > ratio 63%, FEV1 1.99> 2.08L with bronchodilator (76% predicted, 5%), TLC 4.25L (72% pred), DLCO 15.5 (64% pred) 10/2013 imuran started 09/2013 TPMT activity 19 > normal 10/2013 Stop MTX, start Imuran> goal dose is 17m/day (renal adjustment); plan start 574mdaily, increase 2559mvery 3 weeks; weekly CBC/CMET until 125m74my, then q-12 weeks;  11/2013 had to stop imuran due to LFT abnormalities on two tries 02/24/2014 CXR> improved bilateral airspace disease, upper lobe scarring noted 02/24/2014 PFT> Ratio FEV1 1.99L (75% pred, 12% change with BD), TLC 4.04  L (71% pred), DLCO 16.9 (71% pred)      Assessment & Plan:   Pulmonary sarcoidosis This has been a stable interval for Stacie [name] off of prednisone.  She has been feeling well and she is staying active.    Plan: -PFT and CXR in November and f/u with me after that  Asthma, chronic This remains stable and well controlled on QVar.  She needs to use albuterol prior to exercise and to get a flu shot as soon as they come out.  Plan: -continue QVar 2 puffs bid -albuterol prior  to exercise    Updated Medication List Outpatient Encounter Prescriptions as of 05/14/2014  Medication Sig  . albuterol (VENTOLIN HFA) 108 (90 BASE) MCG/ACT inhaler Inhale 2 puffs into the lungs every 6 (six) hours as needed for wheezing.  Marland Kitchen aspirin 81 MG tablet Take 81 mg by mouth daily.  . beclomethasone (QVAR) 80 MCG/ACT inhaler Inhale 2 puffs into the lungs 2 (two) times daily.  Marland Kitchen GLUCOSAMINE PO Take 1 tablet by mouth daily.  . hydroxychloroquine (PLAQUENIL) 200 MG tablet Take 200 mg by mouth 2 (two) times daily.  Marland Kitchen levothyroxine (SYNTHROID, LEVOTHROID) 137 MCG tablet Take 137 mcg by mouth daily before breakfast.  . mometasone (NASONEX) 50 MCG/ACT nasal spray Place 1 spray into the nose 2 (two) times daily.  . Multiple Vitamin (MULTIVITAMIN) capsule Take 1 capsule by mouth 2 (two) times daily.   Marland Kitchen Phenyleph-Promethazine-Cod (PROMETHAZINE VC/CODEINE) 5-6.25-10 MG/5ML SYRP Take 5 mLs by mouth at bedtime as needed.  . potassium chloride (MICRO-K) 10 MEQ CR capsule Take 10 mEq by mouth 2 (two) times daily.  . predniSONE (DELTASONE) 10 MG tablet Take 1 tablet daily. Pt is down to 2.80m qd.  . [DISCONTINUED] predniSONE (DELTASONE) 10 MG tablet Take 1 tablet daily

## 2014-05-14 NOTE — Assessment & Plan Note (Signed)
This remains stable and well controlled on QVar.  She needs to use albuterol prior to exercise and to get a flu shot as soon as they come out.  Plan: -continue QVar 2 puffs bid -albuterol prior to exercise

## 2014-05-14 NOTE — Telephone Encounter (Signed)
Opened in error.

## 2014-05-14 NOTE — Patient Instructions (Signed)
Keep taking the QVar as you are doing Use albuterol ten minutes before exercise Stay active Get a flu shot in the fall We will arrange a PFT and CXR in November at [facility] for your sarcoid and see you after that

## 2014-06-03 ENCOUNTER — Encounter: Payer: Self-pay | Admitting: Pulmonary Disease

## 2014-06-03 ENCOUNTER — Ambulatory Visit (INDEPENDENT_AMBULATORY_CARE_PROVIDER_SITE_OTHER): Payer: BC Managed Care – PPO | Admitting: Pulmonary Disease

## 2014-06-03 VITALS — BP 132/70 | HR 82 | Ht 68.0 in | Wt 202.0 lb

## 2014-06-03 DIAGNOSIS — D860 Sarcoidosis of lung: Secondary | ICD-10-CM

## 2014-06-03 DIAGNOSIS — D869 Sarcoidosis, unspecified: Secondary | ICD-10-CM

## 2014-06-03 DIAGNOSIS — J99 Respiratory disorders in diseases classified elsewhere: Secondary | ICD-10-CM

## 2014-06-03 DIAGNOSIS — R05 Cough: Secondary | ICD-10-CM

## 2014-06-03 DIAGNOSIS — J4540 Moderate persistent asthma, uncomplicated: Secondary | ICD-10-CM

## 2014-06-03 DIAGNOSIS — R059 Cough, unspecified: Secondary | ICD-10-CM

## 2014-06-03 DIAGNOSIS — R5383 Other fatigue: Secondary | ICD-10-CM

## 2014-06-03 DIAGNOSIS — J45909 Unspecified asthma, uncomplicated: Secondary | ICD-10-CM

## 2014-06-03 DIAGNOSIS — R5381 Other malaise: Secondary | ICD-10-CM

## 2014-06-03 NOTE — Patient Instructions (Signed)
Take the Advair 250/50 one puff twice a day in addition to the QVar We will have you get blood work first thing (8:00) tomorrow morning at the [facility] me if you are not better in 2 weeks  We will see you back as previously scheduled.

## 2014-06-03 NOTE — Assessment & Plan Note (Signed)
As noted above I am hoping that the increased shortness of breath she's been experiencing is just related to asthma and allergies.  Plan:  -continue Qvar as a small particle sized steroid  -Add Advair for long-acting bronchodilator in large particle steroid - And Zyrtec daily

## 2014-06-03 NOTE — Assessment & Plan Note (Signed)
The possibilities here are broad. I am most concerned about the possibility of adrenal insufficiency considering the long duration of prednisone which she recently stopped.  Plan: -A.m. cortisol tomorrow -CBC and TSH tomorrow

## 2014-06-03 NOTE — Assessment & Plan Note (Signed)
Today in the office her ambulatory oximetry was normal and that her O2 saturation never dropped to 93% on room air. I explained to her that I hope that her fatigue and increased shortness of breath or just related to asthma and sarcoidosis.  Plan: -Add Advair twice a day in addition to Qvar -If no improvement in 2 weeks then we'll obtain full pulmonary function testing and a chest x-ray to make sure that there has been no change related to sarcoid - If no improvement then we may restart low-dose prednisone for short burst

## 2014-06-03 NOTE — Progress Notes (Signed)
Subjective:    Patient ID: Stacie [name], female    DOB: 1956/09/11, 58 y.o.   MRN: [id number]   Synopsis: Stacie [name] first saw the [facility] pulmonary clinic in May 2014 for evaluation of sarcoidosis. She was diagnosed with sarcoid in 2009 at the [place] via an EBUS guided fine needle aspiration of a hilar lymph node as well as an endobronchial biopsy which showed noncaseating granulomas with negative special stains for infectious organisms. She was treated off and on with steroids from 2009 2 2014 and then in 2014 was started on methotrexate in addition to Plaquenil. She also has chronic kidney disease which is believed to be directly related to sarcoidosis.  She had no improvement with methotrexate and plaquenil and her TLC and DLCO declined slightly so in January 2015 she was started on imuran but this had to be held because of increasing LFTs. She ended up seeing Dr. Lenise Arena at [facility] in 01/2014 for her sarcoid.  He felt like her symptoms were more due to Asthma than Sarcoid.  Plaquenil was restarted, prednisone tapered, and Advair changed to QVar.  HPI  06/03/2014 ROV > Stacie [name] has been struggling lately with shortness of breath and fatigue. She says that this started after she stopped the prednisone on August 1. She had noticed an increasing fatigue while she was decreasing the dose of prednisone to 2.5 mg daily. However about a week and a half after stopping prednisone she's noticed a marked increase in fatigue. She is also noticed chest tightness and wheezing but no frank chest pain. She also denies mucus production or fever or chills. Recently she was helping her daughter move into an apartment on the fifth floor of a building in [place] and she had difficulty climbing a 5 flights of stairs. She has not had leg swelling. She continues to take the Qvar 2 puffs twice a day. She says that whenever she takes albuterol it makes her breathing  better. She has noticed increasing sneezing lately. She attributes this to allergies.   Past Medical History  Diagnosis Date  . Sarcoid      Review of Systems  Constitutional: Positive for fatigue. Negative for fever and chills.  HENT: Negative for congestion, nosebleeds, postnasal drip, rhinorrhea and sinus pressure.   Respiratory: Positive for cough and shortness of breath. Negative for wheezing.   Cardiovascular: Negative for chest pain, palpitations and leg swelling.       Objective:   Physical Exam  Filed Vitals:   06/03/14 1427  BP: 132/70  Pulse: 82  Height: _0  (1.727 m)  Weight: 202 lb (91.627 kg)  SpO2: 94%  RA  Gen: well appearing, no acute distress HEENT: NCAT, EOMi, OP clear,  PULM: wheezes upper lobes bilaterally, clear in bases CV: RRR, no mgr, no JVD AB: BS+, soft, nontender, no hsm Ext: warm, no edema, no clubbing, no cyanosis  Diagnosed 2009 [facility] with EBUS FNA of a hilar lymph node as well as an endobronchial lesion showing noncaseating granulomas, special stains were negative for fungal or AFB Prednisone off and on 2003-2014 11/2012 MTX/HCQ started [facility] Kidney involvement 03/12/2013 6 MW > 1500 feet, O2 saturation nadir 88% RA, HR max 132 03/2013 PFT > Ratio 66%, FEV 2.2L (82% pred), TLC 4.62 L (81% pred), DLCO 16.7 68% 09/2013 PFT > ratio 63%, FEV1 1.99> 2.08L with bronchodilator (76% predicted, 5%), TLC 4.25L (72% pred), DLCO 15.5 (64% pred) 10/2013 imuran started 09/2013 TPMT activity  19 > normal 10/2013 Stop MTX, start Imuran> goal dose is 126m/day (renal adjustment); plan start 556mdaily, increase 2523mvery 3 weeks; weekly CBC/CMET until 125m78my, then q-12 weeks;  11/2013 had to stop imuran due to LFT abnormalities on two tries 02/24/2014 CXR> improved bilateral airspace disease, upper lobe scarring noted 02/24/2014 PFT> Ratio FEV1 1.99L (75% pred, 12% change with BD), TLC 4.04 L (71% pred), DLCO 16.9 (71% pred)      Assessment & Plan:    Pulmonary sarcoidosis Today in the office her ambulatory oximetry was normal and that her O2 saturation never dropped to 93% on room air. I explained to her that I hope that her fatigue and increased shortness of breath or just related to asthma and sarcoidosis.  Plan: -Add Advair twice a day in addition to Qvar -If no improvement in 2 weeks then we'll obtain full pulmonary function testing and a chest x-ray to make sure that there has been no change related to sarcoid - If no improvement then we may restart low-dose prednisone for short burst  Asthma, chronic As noted above I am hoping that the increased shortness of breath she's been experiencing is just related to asthma and allergies.  Plan:  -continue Qvar as a small particle sized steroid  -Add Advair for long-acting bronchodilator in large particle steroid - And Zyrtec daily  Other malaise and fatigue The possibilities here are broad. I am most concerned about the possibility of adrenal insufficiency considering the long duration of prednisone which she recently stopped.  Plan: -A.m. cortisol tomorrow -CBC and TSH tomorrow    Updated Medication List Outpatient Encounter Prescriptions as of 06/03/2014  Medication Sig  . albuterol (VENTOLIN HFA) 108 (90 BASE) MCG/ACT inhaler Inhale 2 puffs into the lungs every 6 (six) hours as needed for wheezing.  . asMarland Kitchenirin 81 MG tablet Take 81 mg by mouth daily.  . beclomethasone (QVAR) 80 MCG/ACT inhaler Inhale 2 puffs into the lungs 2 (two) times daily.  . GLMarland KitchenCOSAMINE PO Take 1 tablet by mouth daily.  . hydroxychloroquine (PLAQUENIL) 200 MG tablet Take 200 mg by mouth 2 (two) times daily.  . leMarland Kitchenothyroxine (SYNTHROID, LEVOTHROID) 137 MCG tablet Take 137 mcg by mouth daily before breakfast.  . mometasone (NASONEX) 50 MCG/ACT nasal spray Place 1 spray into the nose 2 (two) times daily.  . Multiple Vitamin (MULTIVITAMIN) capsule Take 1 capsule by mouth 2 (two) times daily.   .  PMarland Kitchenenyleph-Promethazine-Cod (PROMETHAZINE VC/CODEINE) 5-6.25-10 MG/5ML SYRP Take 5 mLs by mouth at bedtime as needed.  . potassium chloride (MICRO-K) 10 MEQ CR capsule Take 10 mEq by mouth 2 (two) times daily.  . [DISCONTINUED] predniSONE (DELTASONE) 10 MG tablet Take 1 tablet daily. Pt is down to 2.5mg 41m

## 2014-06-04 ENCOUNTER — Other Ambulatory Visit (INDEPENDENT_AMBULATORY_CARE_PROVIDER_SITE_OTHER): Payer: BC Managed Care – PPO

## 2014-06-04 DIAGNOSIS — J99 Respiratory disorders in diseases classified elsewhere: Secondary | ICD-10-CM

## 2014-06-04 DIAGNOSIS — R059 Cough, unspecified: Secondary | ICD-10-CM

## 2014-06-04 DIAGNOSIS — D869 Sarcoidosis, unspecified: Secondary | ICD-10-CM

## 2014-06-04 DIAGNOSIS — D860 Sarcoidosis of lung: Secondary | ICD-10-CM

## 2014-06-04 DIAGNOSIS — R05 Cough: Secondary | ICD-10-CM

## 2014-06-04 DIAGNOSIS — R5383 Other fatigue: Secondary | ICD-10-CM

## 2014-06-04 DIAGNOSIS — R5381 Other malaise: Secondary | ICD-10-CM

## 2014-06-04 LAB — COMPREHENSIVE METABOLIC PANEL
ALT: 16 U/L (ref 0–35)
AST: 23 U/L (ref 0–37)
Albumin: 3.5 g/dL (ref 3.5–5.2)
Alkaline Phosphatase: 71 U/L (ref 39–117)
BUN: 22 mg/dL (ref 6–23)
CO2: 28 mEq/L (ref 19–32)
Calcium: 9.7 mg/dL (ref 8.4–10.5)
Chloride: 104 mEq/L (ref 96–112)
Creatinine, Ser: 1.2 mg/dL (ref 0.4–1.2)
GFR: 49.81 mL/min — ABNORMAL LOW (ref >60.00)
Glucose, Bld: 99 mg/dL (ref 70–99)
Potassium: 3.9 mEq/L (ref 3.5–5.1)
Sodium: 141 mEq/L (ref 135–145)
Total Bilirubin: 0.5 mg/dL (ref 0.2–1.2)
Total Protein: 7.2 g/dL (ref 6.0–8.3)

## 2014-06-04 LAB — CBC WITH DIFFERENTIAL/PLATELET
Basophils Absolute: 0 10*3/uL (ref 0.0–0.1)
Basophils Relative: 0.5 % (ref 0.0–3.0)
Eosinophils Absolute: 0.3 10*3/uL (ref 0.0–0.7)
Eosinophils Relative: 3.8 % (ref 0.0–5.0)
HCT: 43.2 % (ref 36.0–46.0)
Hemoglobin: 14.6 g/dL (ref 12.0–15.0)
Lymphocytes Relative: 20.3 % (ref 12.0–46.0)
Lymphs Abs: 1.6 10*3/uL (ref 0.7–4.0)
MCHC: 33.8 g/dL (ref 30.0–36.0)
MCV: 89.3 fl (ref 78.0–100.0)
Monocytes Absolute: 0.8 10*3/uL (ref 0.1–1.0)
Monocytes Relative: 9.9 % (ref 3.0–12.0)
Neutro Abs: 5 10*3/uL (ref 1.4–7.7)
Neutrophils Relative %: 65.5 % (ref 43.0–77.0)
Platelets: 243 10*3/uL (ref 150.0–400.0)
RBC: 4.84 Mil/uL (ref 3.87–5.11)
RDW: 13.7 % (ref 11.5–15.5)
WBC: 7.7 10*3/uL (ref 4.0–10.5)

## 2014-06-04 LAB — TSH: TSH: 2.96 u[IU]/mL (ref 0.35–4.50)

## 2014-06-04 LAB — CORTISOL: Cortisol, Plasma: 12.7 ug/dL

## 2014-06-05 NOTE — Progress Notes (Signed)
Quick Note:  Spoke with pt, she is aware of results and recs. Nothing further needed. ______ 

## 2014-07-02 ENCOUNTER — Ambulatory Visit (INDEPENDENT_AMBULATORY_CARE_PROVIDER_SITE_OTHER): Payer: BC Managed Care – PPO | Admitting: Pulmonary Disease

## 2014-07-02 ENCOUNTER — Encounter: Payer: Self-pay | Admitting: Pulmonary Disease

## 2014-07-02 VITALS — BP 136/74 | HR 110 | Temp 98.2°F | Ht 68.0 in | Wt 197.0 lb

## 2014-07-02 DIAGNOSIS — R5383 Other fatigue: Secondary | ICD-10-CM

## 2014-07-02 DIAGNOSIS — D860 Sarcoidosis of lung: Secondary | ICD-10-CM

## 2014-07-02 DIAGNOSIS — J99 Respiratory disorders in diseases classified elsewhere: Secondary | ICD-10-CM

## 2014-07-02 DIAGNOSIS — D869 Sarcoidosis, unspecified: Secondary | ICD-10-CM

## 2014-07-02 DIAGNOSIS — J4540 Moderate persistent asthma, uncomplicated: Secondary | ICD-10-CM

## 2014-07-02 DIAGNOSIS — R5381 Other malaise: Secondary | ICD-10-CM

## 2014-07-02 DIAGNOSIS — J45909 Unspecified asthma, uncomplicated: Secondary | ICD-10-CM

## 2014-07-02 MED ORDER — BENZONATATE 200 MG PO CAPS: 200.0000 mg | ORAL_CAPSULE | Freq: Three times a day (TID) | ORAL | Status: DC | PRN

## 2014-07-02 MED ORDER — PREDNISONE 10 MG PO TABS: ORAL_TABLET | ORAL | Status: DC

## 2014-07-02 NOTE — Progress Notes (Signed)
Subjective:    Patient ID: Stacie [name], female    DOB: 11/10/1955, 58 y.o.   MRN: [id number]   Synopsis: Stacie [name] first saw the [facility] pulmonary clinic in May 2014 for evaluation of sarcoidosis. She was diagnosed with sarcoid in 2009 at the [place] via an EBUS guided fine needle aspiration of a hilar lymph node as well as an endobronchial biopsy which showed noncaseating granulomas with negative special stains for infectious organisms. She was treated off and on with steroids from 2009 2 2014 and then in 2014 was started on methotrexate in addition to Plaquenil. She also has chronic kidney disease which is believed to be directly related to sarcoidosis.  She had no improvement with methotrexate and plaquenil and her TLC and DLCO declined slightly so in January 2015 she was started on imuran but this had to be held because of increasing LFTs. She ended up seeing Dr. Lenise Arena at [facility] in 01/2014 for her sarcoid.  He felt like her symptoms were more due to Asthma than Sarcoid.  Plaquenil was restarted, prednisone tapered, and Advair changed to QVar.  HPI  07/02/2014 ROV> Stacie [name] has been feeling miserable for the last two weeks with no energy.  She feels more short of breath than normal.  Just walking from her kitchen to her living room. No pain.  She is coughing some, but minimal mucus production. No fevers or chills.  She feels more cold now than normal. But no shaking chills.  Sinus congestion is not too bad, some headache.  Zyrtec didn't help.   She notes daytime somnolence, fatigue, and morning headaches.  She has been told that she snores.     Past Medical History  Diagnosis Date  . Sarcoid      Review of Systems  Constitutional: Positive for fatigue. Negative for fever and chills.  HENT: Negative for congestion, nosebleeds, postnasal drip, rhinorrhea and sinus pressure.   Respiratory: Positive for cough and shortness of breath.  Negative for wheezing.   Cardiovascular: Negative for chest pain, palpitations and leg swelling.       Objective:   Physical Exam  Filed Vitals:   07/02/14 1604  BP: 136/74  Pulse: 110  Temp: 98.2 F (36.8 C)  TempSrc: Oral  Height: _0  (1.727 m)  Weight: 197 lb (89.359 kg)  SpO2: 93%  RA  Gen: well appearing, no acute distress HEENT: NCAT, EOMi, OP clear,  PULM: wheezes upper lobes bilaterally, clear in bases CV: RRR, no mgr, no JVD AB: BS+, soft, nontender, no hsm Ext: warm, no edema, no clubbing, no cyanosis  Diagnosed 2009 [facility] with EBUS FNA of a hilar lymph node as well as an endobronchial lesion showing noncaseating granulomas, special stains were negative for fungal or AFB Prednisone off and on 2003-2014 11/2012 MTX/HCQ started [facility] Kidney involvement 03/12/2013 6 MW > 1500 feet, O2 saturation nadir 88% RA, HR max 132 03/2013 PFT > Ratio 66%, FEV 2.2L (82% pred), TLC 4.62 L (81% pred), DLCO 16.7 68% 09/2013 PFT > ratio 63%, FEV1 1.99> 2.08L with bronchodilator (76% predicted, 5%), TLC 4.25L (72% pred), DLCO 15.5 (64% pred) 10/2013 imuran started 09/2013 TPMT activity 19 > normal 10/2013 Stop MTX, start Imuran> goal dose is 142m/day (renal adjustment); plan start 511mdaily, increase 2560mvery 3 weeks; weekly CBC/CMET until 125m68my, then q-12 weeks;  11/2013 had to stop imuran due to LFT abnormalities on two tries 02/24/2014 CXR> improved bilateral airspace disease, upper lobe scarring  noted 02/24/2014 PFT> Ratio FEV1 1.99L (75% pred, 12% change with BD), TLC 4.04 L (71% pred), DLCO 16.9 (71% pred)      Assessment & Plan:   Pulmonary sarcoidosis Lately her fatigue, cough and shortness of breath have been progressing.  This is worrisome for progressive sarcoid, yet all objective data collected lately (last 12 months) have not showed clear signs of progressive sarcoid.  However I tried to treat this as worsening asthma and she continues to decline.  We looked for  evidence of anemia, adrenal insufficiency, and hypothyroidism on the last visit and all testing was normal.  Plan: -So at this point I'm going to collect PFTs and a Chest X-ray, if needed will obtain high resolution CT chest -obtain echocardiogram to screen for pulmonary hypertension given dyspnea -steroids taper for 5 weeks -consider starting steroid sparing agent again if evidence of progression of sarcoid  Other malaise and fatigue As above.  However I worry she may have obstructive sleep apnea as she notes daytime somnolence and fatigue.  Plan: -sleep study  Asthma, chronic Continue Advair  Use tessalon for cough    Updated Medication List Outpatient Encounter Prescriptions as of 07/02/2014  Medication Sig  . albuterol (VENTOLIN HFA) 108 (90 BASE) MCG/ACT inhaler Inhale 2 puffs into the lungs every 6 (six) hours as needed for wheezing.  Marland Kitchen aspirin 81 MG tablet Take 81 mg by mouth daily.  . beclomethasone (QVAR) 80 MCG/ACT inhaler Inhale 2 puffs into the lungs 2 (two) times daily.  Marland Kitchen GLUCOSAMINE PO Take 1 tablet by mouth daily.  . hydroxychloroquine (PLAQUENIL) 200 MG tablet Take 200 mg by mouth 2 (two) times daily.  Marland Kitchen levothyroxine (SYNTHROID, LEVOTHROID) 137 MCG tablet Take 137 mcg by mouth daily before breakfast.  . mometasone (NASONEX) 50 MCG/ACT nasal spray Place 1 spray into the nose 2 (two) times daily.  . Multiple Vitamin (MULTIVITAMIN) capsule Take 1 capsule by mouth 2 (two) times daily.   Marland Kitchen Phenyleph-Promethazine-Cod (PROMETHAZINE VC/CODEINE) 5-6.25-10 MG/5ML SYRP Take 5 mLs by mouth at bedtime as needed.  . potassium chloride (MICRO-K) 10 MEQ CR capsule Take 10 mEq by mouth 2 (two) times daily.  . benzonatate (TESSALON) 200 MG capsule Take 1 capsule (200 mg total) by mouth 3 (three) times daily as needed for cough.  . predniSONE (DELTASONE) 10 MG tablet 45m daily for a week, then 225mdaily for 2 weeks, then 1077maily for 2 weeks

## 2014-07-02 NOTE — Assessment & Plan Note (Signed)
Continue Advair  Use tessalon for cough

## 2014-07-02 NOTE — Assessment & Plan Note (Signed)
As above.  However I worry she may have obstructive sleep apnea as she notes daytime somnolence and fatigue.  Plan: -sleep study

## 2014-07-02 NOTE — Assessment & Plan Note (Signed)
Lately her fatigue, cough and shortness of breath have been progressing.  This is worrisome for progressive sarcoid, yet all objective data collected lately (last 12 months) have not showed clear signs of progressive sarcoid.  However I tried to treat this as worsening asthma and she continues to decline.  We looked for evidence of anemia, adrenal insufficiency, and hypothyroidism on the last visit and all testing was normal.  Plan: -So at this point I'm going to collect PFTs and a Chest X-ray, if needed will obtain high resolution CT chest -obtain echocardiogram to screen for pulmonary hypertension given dyspnea -steroids taper for 5 weeks -consider starting steroid sparing agent again if evidence of progression of sarcoid

## 2014-07-02 NOTE — Patient Instructions (Addendum)
Take the prednisone taper as written Take the tessalon as needed for cough Go get a chest x-ray tomorrow ([place]) We will arrange a pulmonary function test at [facility] We will arrange an echocardiogram at [facility] for shortness of breath We will arrange a sleep study here in [place]  We will see you back in 4 weeks or sooner if needed

## 2014-07-03 ENCOUNTER — Ambulatory Visit (INDEPENDENT_AMBULATORY_CARE_PROVIDER_SITE_OTHER)
Admission: RE | Admit: 2014-07-03 | Discharge: 2014-07-03 | Disposition: A | Discharge status: Home / Self Care | Payer: BC Managed Care – PPO | Source: Ambulatory Visit | Attending: Pulmonary Disease | Admitting: Pulmonary Disease

## 2014-07-03 DIAGNOSIS — D869 Sarcoidosis, unspecified: Secondary | ICD-10-CM

## 2014-07-04 NOTE — Progress Notes (Signed)
Quick Note:  Spoke with pt, she is aware of results and recs. Nothing further needed. ______

## 2014-07-08 ENCOUNTER — Ambulatory Visit: Payer: Self-pay | Admitting: Pulmonary Disease

## 2014-07-13 ENCOUNTER — Encounter: Payer: Self-pay | Admitting: Pulmonary Disease

## 2014-07-14 ENCOUNTER — Telehealth: Payer: Self-pay

## 2014-07-14 DIAGNOSIS — R5383 Other fatigue: Principal | ICD-10-CM

## 2014-07-14 DIAGNOSIS — R5381 Other malaise: Secondary | ICD-10-CM

## 2014-07-14 NOTE — Telephone Encounter (Signed)
Yes, push back 4 weeks

## 2014-07-14 NOTE — Telephone Encounter (Signed)
Message copied by Len Blalock on Mon Jul 14, 2014  3:48 PM ------      Message from: Juanito Doom      Created: Sun Jul 13, 2014  2:50 AM       A,      Please let her know that her echo was OK      Thanks      B ------

## 2014-07-14 NOTE — Telephone Encounter (Signed)
Spoke with pt, she is aware of results.  She was also asking about her sleep study scheduled next week and was wondering if her prednisone would interfere with it.  She states the prednisone makes her sleep less than normal, and was wondering if she needed to come off it or push back the sleep study. Please advise.   Thanks!

## 2014-07-14 NOTE — Telephone Encounter (Signed)
Pt aware of recs.  Order placed to reschedule sleep study.  Nothing further needed.

## 2014-07-17 NOTE — Telephone Encounter (Signed)
Dr Lake Bells just Juluis Rainier.  Nothing further needed.

## 2014-07-17 NOTE — Telephone Encounter (Signed)
noted 

## 2014-07-17 NOTE — Telephone Encounter (Signed)
Faxed updated Order from Dr. Lake Bells to [facility] [phone]).  Called [facility] Scheduling [phone]) & spoke with Kennyth Lose.  She verified she had received the new Order to postpone pt's sleep study back 4 wks.  Kennyth Lose stated she would reschedule the pt & call the pt with the new appt info.    I called the pt & left a VM on pt's phone advising the new Order had been faxed & received by [facility] & that Kennyth Lose would be contacting her with new appt info Kara Mead

## 2014-07-21 ENCOUNTER — Encounter: Payer: Self-pay | Admitting: Pulmonary Disease

## 2014-07-23 ENCOUNTER — Encounter: Payer: Self-pay | Admitting: Pulmonary Disease

## 2014-07-30 ENCOUNTER — Ambulatory Visit (INDEPENDENT_AMBULATORY_CARE_PROVIDER_SITE_OTHER): Payer: BC Managed Care – PPO | Admitting: Pulmonary Disease

## 2014-07-30 ENCOUNTER — Encounter: Payer: Self-pay | Admitting: Pulmonary Disease

## 2014-07-30 VITALS — BP 134/68 | HR 99 | Ht 68.0 in | Wt 200.0 lb

## 2014-07-30 DIAGNOSIS — Z23 Encounter for immunization: Secondary | ICD-10-CM

## 2014-07-30 DIAGNOSIS — J4540 Moderate persistent asthma, uncomplicated: Secondary | ICD-10-CM

## 2014-07-30 DIAGNOSIS — D860 Sarcoidosis of lung: Secondary | ICD-10-CM

## 2014-07-30 MED ORDER — MONTELUKAST SODIUM 10 MG PO TABS: 10.0000 mg | ORAL_TABLET | Freq: Every day | ORAL | Status: DC

## 2014-07-30 NOTE — Progress Notes (Signed)
Subjective:    Patient ID: Stacie [name], female    DOB: 1955/10/21, 58 y.o.   MRN: [id number]   Synopsis: Stacie [name] first saw the [facility] pulmonary clinic in May 2014 for evaluation of sarcoidosis. She was diagnosed with sarcoid in 2009 at the [place] via an EBUS guided fine needle aspiration of a hilar lymph node as well as an endobronchial biopsy which showed noncaseating granulomas with negative special stains for infectious organisms. She was treated off and on with steroids from 2009 2 2014 and then in 2014 was started on methotrexate in addition to Plaquenil. She also has chronic kidney disease which is believed to be directly related to sarcoidosis.  She had no improvement with methotrexate and plaquenil and her TLC and DLCO declined slightly so in January 2015 she was started on imuran but this had to be held because of increasing LFTs. She ended up seeing Dr. Lenise Arena at [facility] in 01/2014 for her sarcoid.  He felt like her symptoms were more due to Asthma than Sarcoid.  Plaquenil was restarted, prednisone tapered, and Advair changed to QVar.  HPI  Chief Complaint  Patient presents with  . Follow-up    Pt feeling much better since last visit.  ENT gave her Levaquin, still having prod cough with creamy white mucus, worse in mornings and eating.  States she feels much better since last visit.     07/30/2014 ROV > About a week ago [employer] caught a cold and she had some green mucus production. She took Levaquin which helped.  She is still coughing more but no longer green mucus.  She has never had a sputum culture.  She thinks that the breathing is better than before  She has good energy. She feels that she is improving overall. Her wheezing has decreased. She notes that whenever she goes into certain environments (like recently when she went to a large hardware store) she will get increasing sinus symptoms and cough and wheezing. She is to  undergo allergy testing soon. Overall though she feels like she has improved since the last visit. She is now down to a low dose of prednisone at 10 mg daily. She tells me that she does not take Singulair. She is currently taking the Advair and Qvar. She continues to take the plaquenil.   Past Medical History  Diagnosis Date  . Sarcoid      Review of Systems  Constitutional: Negative for fever, chills and fatigue.  HENT: Negative for congestion, nosebleeds, postnasal drip, rhinorrhea and sinus pressure.   Respiratory: Positive for cough. Negative for shortness of breath and wheezing.   Cardiovascular: Negative for chest pain, palpitations and leg swelling.       Objective:   Physical Exam  Filed Vitals:   07/30/14 0910  BP: 134/68  Pulse: 99  Height: _0  (1.727 m)  Weight: 200 lb (90.719 kg)  SpO2: 96%  RA  Gen: well appearing, no acute distress HEENT: NCAT, EOMi, OP clear without thrush PULM: wheezes upper lobes bilaterally again today, clear in bases CV: RRR, no mgr, no JVD AB: BS+, soft, nontender Ext: warm, no edema, no clubbing, no cyanosis  Diagnosed 2009 [facility] with EBUS FNA of a hilar lymph node as well as an endobronchial lesion showing noncaseating granulomas, special stains were negative for fungal or AFB Prednisone off and on 2003-2014 11/2012 MTX/HCQ started [facility] Kidney involvement 03/12/2013 6 MW > 1500 feet, O2 saturation nadir 88% RA,  HR max 132 03/2013 PFT > Ratio 66%, FEV 2.2L (82% pred), TLC 4.62 L (81% pred), DLCO 16.7 68% 09/2013 PFT > ratio 63%, FEV1 1.99> 2.08L with bronchodilator (76% predicted, 5%), TLC 4.25L (72% pred), DLCO 15.5 (64% pred) 10/2013 imuran started 09/2013 TPMT activity 19 > normal 10/2013 Stop MTX, start Imuran> goal dose is 187m/day (renal adjustment); plan start 545mdaily, increase 25103mvery 3 weeks; weekly CBC/CMET until 125m24my, then q-12 weeks;  11/2013 had to stop imuran due to LFT abnormalities on two tries 02/24/2014 CXR>  improved bilateral airspace disease, upper lobe scarring noted 02/24/2014 PFT> Ratio FEV1 1.99L (75% pred, 12% change with BD), TLC 4.04 L (71% pred), DLCO 16.9 (71% pred)      Assessment & Plan:   Pulmonary sarcoidosis The pulmonary function testing we obtained did not show evidence of worsening restrictive lung disease or diffusion. This encourages me that she has not had progression of her sarcoid.  Plan: -Continue plaquenil for now, may stop this in 3-6 months  Asthma, chronic Her lung function test did show worsening airflow obstruction consistent with asthma. So at this point we need to focus on maximizing her asthma control. Her husband notes that she seems to be more sensitive to the mold and she is going to undergo allergy testing soon.  Plan: - Flu shot today -Continue maximal asthma inhaler therapy with the Qvar and Advair -Add Singulair -Taper off prednisone -After tapering off prednisone check for serum IgE as well as aspergillus IgE specific antigens -Culture sputum for bacteria, AFB, and fungal organisms -Followup 3 months      Updated Medication List Outpatient Encounter Prescriptions as of 07/30/2014  Medication Sig  . albuterol (VENTOLIN HFA) 108 (90 BASE) MCG/ACT inhaler Inhale 2 puffs into the lungs every 6 (six) hours as needed for wheezing.  . asMarland Kitchenirin 81 MG tablet Take 81 mg by mouth daily.  . beclomethasone (QVAR) 80 MCG/ACT inhaler Inhale 2 puffs into the lungs 2 (two) times daily.  . GLMarland KitchenCOSAMINE PO Take 1 tablet by mouth daily.  . hydroxychloroquine (PLAQUENIL) 200 MG tablet Take 200 mg by mouth 2 (two) times daily.  . leMarland Kitchenofloxacin (LEVAQUIN) 750 MG tablet Take 1 tablet by mouth daily.  . leMarland Kitchenothyroxine (SYNTHROID, LEVOTHROID) 137 MCG tablet Take 137 mcg by mouth daily before breakfast.  . mometasone (NASONEX) 50 MCG/ACT nasal spray Place 1 spray into the nose 2 (two) times daily.  . Multiple Vitamin (MULTIVITAMIN) capsule Take 1 capsule by mouth 2 (two)  times daily.   . PhMarland Kitchennyleph-Promethazine-Cod (PROMETHAZINE VC/CODEINE) 5-6.25-10 MG/5ML SYRP Take 5 mLs by mouth at bedtime as needed.  . potassium chloride (MICRO-K) 10 MEQ CR capsule Take 10 mEq by mouth 2 (two) times daily.  . predniSONE (DELTASONE) 10 MG tablet 30mg47mly for a week, then 20mg 31my for 2 weeks, then 10mg d29m for 2 weeks  . ranitidine (ZANTAC) 150 MG tablet Take 150 mg by mouth at bedtime.  . [DISCONTINUED] benzonatate (TESSALON) 200 MG capsule Take 1 capsule (200 mg total) by mouth 3 (three) times daily as needed for cough.

## 2014-07-30 NOTE — Assessment & Plan Note (Signed)
The pulmonary function testing we obtained did not show evidence of worsening restrictive lung disease or diffusion. This encourages me that she has not had progression of her sarcoid.  Plan: -Continue plaquenil for now, may stop this in 3-6 months

## 2014-07-30 NOTE — Patient Instructions (Signed)
Prednisone: take 96m daily for a week, then take 570mdaily for a week, then take 59m659mvery other day for a week then stop Come back 2 weeks after the prednisone stops for blood work BriAdministrator, Civil Servicesample of sputum so we can culture it Start taking the Montelukast today We will see you back in 3 months or sooner if needed

## 2014-07-30 NOTE — Assessment & Plan Note (Addendum)
Her lung function test did show worsening airflow obstruction consistent with asthma. So at this point we need to focus on maximizing her asthma control. Her husband notes that she seems to be more sensitive to the mold and she is going to undergo allergy testing soon.  Plan: - Flu shot today -Continue maximal asthma inhaler therapy with the Qvar and Advair -Add Singulair -Taper off prednisone -After tapering off prednisone check for serum IgE as well as aspergillus IgE specific antigens -Culture sputum for bacteria, AFB, and fungal organisms -Followup 3 months

## 2014-07-31 NOTE — Addendum Note (Signed)
Addended by: Karlene Einstein D on: 07/31/2014 08:31 AM   Modules accepted: Orders

## 2014-08-18 ENCOUNTER — Telehealth: Payer: Self-pay | Admitting: Pulmonary Disease

## 2014-08-18 NOTE — Telephone Encounter (Signed)
Spoke with pt, she is requesting sputum culture results.  I do not have any results on her in my inbox.  Dr. Lake Bells do you have these results yet?  Thanks!

## 2014-08-19 NOTE — Telephone Encounter (Signed)
Spoke with pt and advised of Dr Anastasia Pall results. Will forward back to BQ .

## 2014-08-19 NOTE — Telephone Encounter (Signed)
Yes, she had a rare mold grow from her mucus.  Tell her I am looking into it now.  So far my research has shown that it doesn't look like anything to be concerned about, but I am going to talk to one of my infectious disease colleagues about it.

## 2014-08-23 LAB — FUNGUS CULTURE W SMEAR: Smear Result: NONE SEEN

## 2014-08-26 ENCOUNTER — Telehealth: Payer: Self-pay

## 2014-08-26 DIAGNOSIS — R059 Cough, unspecified: Secondary | ICD-10-CM

## 2014-08-26 DIAGNOSIS — R05 Cough: Secondary | ICD-10-CM

## 2014-08-26 NOTE — Telephone Encounter (Signed)
Pt returning call.Stacie [name] ° °

## 2014-08-26 NOTE — Telephone Encounter (Signed)
lmtcb X1 to share results/recs.  Order already placed.

## 2014-08-26 NOTE — Telephone Encounter (Signed)
-----  Message from Juanito Doom, MD sent at 08/26/2014  8:00 AM EST ----- A,  Please let her know that I discussed the mold species from her sputum culture with infectious diseases.  They don't think that it is anything to worry about.  However, to be safe, let's collect another sputum fungal culture to see if it grows out again.  Thanks B

## 2014-08-26 NOTE — Telephone Encounter (Signed)
Spoke with pt, she is aware of results and recs.  Nothing further needed. 

## 2014-09-03 ENCOUNTER — Other Ambulatory Visit: Payer: BC Managed Care – PPO | Admitting: *Deleted

## 2014-09-03 DIAGNOSIS — D860 Sarcoidosis of lung: Secondary | ICD-10-CM

## 2014-09-16 ENCOUNTER — Ambulatory Visit: Payer: Self-pay | Admitting: Pulmonary Disease

## 2014-09-22 ENCOUNTER — Telehealth: Payer: Self-pay | Admitting: Pulmonary Disease

## 2014-09-22 NOTE — Telephone Encounter (Signed)
[id number]. Ruth Bing, CMA

## 2014-09-22 NOTE — Telephone Encounter (Signed)
Called spoke with patient and discussed MW's recommendations.  Pt stated that she actually did not want to be treated over the phone and just wanted to see BQ in the office before January.  Advised pt BQ is scheduled off all this week but does return to the [place] office (pt's preferred location) on 12/14 w/ an opening at 11am.  Pt okay with this appt date/time and stated that she is fine to wait until then for treatment.  Advised pt to please call the office should she feel her symptoms worsening so that we can work her in with another provider.  Pt voiced her understanding.  Pt advised of [place] new address.  Nothing further needed at this time; will sign off.

## 2014-09-22 NOTE — Telephone Encounter (Signed)
Nothing else to suggest over the phone, needs ov this week with all meds in hand

## 2014-09-22 NOTE — Telephone Encounter (Signed)
Last OV 07-30-14, Dr. Lake Bells sarcoidosis pt. Pt states since last OV she has not been any better. She states she is very fatigued and is having increased productive cough with yellow phlegm. Pt did give a sputum culture in November, looks like the results are pending. She is asking for recs. Please advise. [place] Bing, CMA Allergies  Allergen Reactions  . Tramadol     Nausea and vomiting  . Sulfa Antibiotics Rash    As an infant   [facility]

## 2014-09-29 ENCOUNTER — Ambulatory Visit (INDEPENDENT_AMBULATORY_CARE_PROVIDER_SITE_OTHER): Payer: BC Managed Care – PPO | Admitting: Pulmonary Disease

## 2014-09-29 ENCOUNTER — Encounter: Payer: Self-pay | Admitting: Pulmonary Disease

## 2014-09-29 VITALS — BP 136/78 | HR 107 | Ht 68.0 in | Wt 194.0 lb

## 2014-09-29 DIAGNOSIS — J4530 Mild persistent asthma, uncomplicated: Secondary | ICD-10-CM

## 2014-09-29 DIAGNOSIS — G4733 Obstructive sleep apnea (adult) (pediatric): Secondary | ICD-10-CM | POA: Insufficient documentation

## 2014-09-29 DIAGNOSIS — D860 Sarcoidosis of lung: Secondary | ICD-10-CM

## 2014-09-29 NOTE — Progress Notes (Signed)
Subjective:    Patient ID: Stacie [name], female    DOB: 31-Jul-1956, 58 y.o.   MRN: [id number]   Synopsis: Stacie [name] first saw the [facility] pulmonary clinic in May 2014 for evaluation of sarcoidosis. She was diagnosed with sarcoid in 2009 at the [place] via an EBUS guided fine needle aspiration of a hilar lymph node as well as an endobronchial biopsy which showed noncaseating granulomas with negative special stains for infectious organisms. She was treated off and on with steroids from 2009 2 2014 and then in 2014 was started on methotrexate in addition to Plaquenil. She also has chronic kidney disease which is believed to be directly related to sarcoidosis.  She had no improvement with methotrexate and plaquenil and her TLC and DLCO declined slightly so in January 2015 she was started on imuran but this had to be held because of increasing LFTs. She ended up seeing Dr. Lenise Arena at [facility] in 01/2014 for her sarcoid.  He felt like her symptoms were more due to Asthma than Sarcoid.  Plaquenil was restarted, prednisone tapered, and Advair changed to QVar.  HPI Chief Complaint  Patient presents with  . Follow-up    Patient c/o cough with yellowish mucus. Patient has sl.wheezing but denies chest tightness. She would like results of sputum culture and sleep study.   09/29/2014 ROV > Stacie [name] says that she has been very fatigued recently. She has been having a lot of shortness of breath with exertion. She says this all started after she stopped taking prednisone. She does not feel that she has more chest tightness, or cough. She is not producing more mucus than normal. She feels fatigued all day long and she says she cannot stay awake. She has been using her nebulizer followed by the Qvar and Advair. She continues to take the Singulair.she says that the fatigue is very severe. She feels like she is getting weaker.    Past Medical History  Diagnosis Date   . Sarcoid      Review of Systems  Constitutional: Negative for fever, chills and fatigue.  HENT: Negative for congestion, nosebleeds, postnasal drip, rhinorrhea and sinus pressure.   Respiratory: Positive for cough. Negative for shortness of breath and wheezing.   Cardiovascular: Negative for chest pain, palpitations and leg swelling.       Objective:   Physical Exam Filed Vitals:   09/29/14 1054  BP: 136/78  Pulse: 107  Height: _0  (1.727 m)  Weight: 194 lb (87.998 kg)  SpO2: 93%  RA  Gen: fatigued appearing, no acute distress HEENT: NCAT, EOMi, OP clear without thrush PULM: CTA B todayI start and CV: RRR, no mgr, no JVD AB: BS+, soft, nontender Ext: warm, no edema, no clubbing, no cyanosis  Diagnosed 2009 [facility] with EBUS FNA of a hilar lymph node as well as an endobronchial lesion showing noncaseating granulomas, special stains were negative for fungal or AFB Prednisone off and on 2003-2014 11/2012 MTX/HCQ started [facility] Kidney involvement 03/12/2013 6 MW > 1500 feet, O2 saturation nadir 88% RA, HR max 132 03/2013 PFT > Ratio 66%, FEV 2.2L (82% pred), TLC 4.62 L (81% pred), DLCO 16.7 68% 09/2013 PFT > ratio 63%, FEV1 1.99> 2.08L with bronchodilator (76% predicted, 5%), TLC 4.25L (72% pred), DLCO 15.5 (64% pred) 10/2013 imuran started 09/2013 TPMT activity 19 > normal 10/2013 Stop MTX, start Imuran> goal dose is 134m/day (renal adjustment); plan start 576mdaily, increase 2562mvery 3 weeks; weekly CBC/CMET  until 167m/day, then q-12 weeks;  11/2013 had to stop imuran due to LFT abnormalities on two tries 02/24/2014 CXR> improved bilateral airspace disease, upper lobe scarring noted 02/24/2014 PFT> Ratio FEV1 1.99L (75% pred, 12% change with BD), TLC 4.04 L (71% pred), DLCO 16.9 (71% pred)      Assessment & Plan:   Pulmonary sarcoidosis Fortunately there has been no evidence of progression in her sarcoid both on chest imaging as well as pulmonary function testing. I am  inclined to stop the Plaquenil in January.  Plan: -Continue Plaquenil for now, likely stop in January 2016 -Continue every 6 month pulmonary function testing and chest imaging  Asthma, chronic The majority of her respiratory complaints are related to asthma. Fortunately, it does not sound like her asthma has been flaring in the last several weeks. Her biggest problem right now is significant fatigue.  Plan: -Continue Qvar and Advair  Obstructive sleep apnea Her recent sleep study showed a very high number of respiratory related arousals as well as hypopnea. This combined with her severe fatigue is worrisome for significant obstructive sleep apnea. While she does not meet traditional criteria the severity of her symptoms suggest that she should be treated with CPAP therapy. She also needs to exercise regularly and lose weight. I have tested her extensively for other causes of fatigue with lab work and have not found another clear etiology.  Plan: -Weight loss encouraged with regular exercise -Start CPAP with an auto titrating CPAP device.  I will also refer her to pulmonary rehabilitation for her deconditioning as well as chronic shortness of breath on exertion. I believe that a big component of her dyspnea is related to deconditioning.  Updated Medication List Outpatient Encounter Prescriptions as of 09/29/2014  Medication Sig  . albuterol (VENTOLIN HFA) 108 (90 BASE) MCG/ACT inhaler Inhale 2 puffs into the lungs every 6 (six) hours as needed for wheezing.  .Marland Kitchenaspirin 81 MG tablet Take 81 mg by mouth daily.  . beclomethasone (QVAR) 80 MCG/ACT inhaler Inhale 2 puffs into the lungs 2 (two) times daily.  .Marland KitchenGLUCOSAMINE PO Take 1 tablet by mouth daily.  . hydroxychloroquine (PLAQUENIL) 200 MG tablet Take 200 mg by mouth 2 (two) times daily.  .Marland Kitchenlevofloxacin (LEVAQUIN) 750 MG tablet Take 1 tablet by mouth daily.  .Marland Kitchenlevothyroxine (SYNTHROID, LEVOTHROID) 137 MCG tablet Take 137 mcg by mouth daily  before breakfast.  . mometasone (NASONEX) 50 MCG/ACT nasal spray Place 1 spray into the nose 2 (two) times daily.  . montelukast (SINGULAIR) 10 MG tablet Take 1 tablet (10 mg total) by mouth daily.  . Multiple Vitamin (MULTIVITAMIN) capsule Take 1 capsule by mouth 2 (two) times daily.   . ranitidine (ZANTAC) 150 MG tablet Take 150 mg by mouth at bedtime.  . [DISCONTINUED] Phenyleph-Promethazine-Cod (PROMETHAZINE VC/CODEINE) 5-6.25-10 MG/5ML SYRP Take 5 mLs by mouth at bedtime as needed. (Patient not taking: Reported on 09/29/2014)  . [DISCONTINUED] potassium chloride (MICRO-K) 10 MEQ CR capsule Take 10 mEq by mouth 2 (two) times daily.  . [DISCONTINUED] predniSONE (DELTASONE) 10 MG tablet 364mdaily for a week, then 2022maily for 2 weeks, then 63m72mily for 2 weeks (Patient not taking: Reported on 09/29/2014)

## 2014-09-29 NOTE — Assessment & Plan Note (Signed)
Her recent sleep study showed a very high number of respiratory related arousals as well as hypopnea. This combined with her severe fatigue is worrisome for significant obstructive sleep apnea. While she does not meet traditional criteria the severity of her symptoms suggest that she should be treated with CPAP therapy. She also needs to exercise regularly and lose weight. I have tested her extensively for other causes of fatigue with lab work and have not found another clear etiology.  Plan: -Weight loss encouraged with regular exercise -Start CPAP with an auto titrating CPAP device.

## 2014-09-29 NOTE — Patient Instructions (Signed)
We will arrange a CPAP machine for you We will refer you to pulmonary rehab WE will see you back in 6 weeks or sooner if needed

## 2014-09-29 NOTE — Assessment & Plan Note (Signed)
The majority of her respiratory complaints are related to asthma. Fortunately, it does not sound like her asthma has been flaring in the last several weeks. Her biggest problem right now is significant fatigue.  Plan: -Continue Qvar and Advair

## 2014-09-29 NOTE — Assessment & Plan Note (Signed)
Fortunately there has been no evidence of progression in her sarcoid both on chest imaging as well as pulmonary function testing. I am inclined to stop the Plaquenil in January.  Plan: -Continue Plaquenil for now, likely stop in January 2016 -Continue every 6 month pulmonary function testing and chest imaging

## 2014-10-01 ENCOUNTER — Telehealth: Payer: Self-pay | Admitting: Pulmonary Disease

## 2014-10-01 DIAGNOSIS — G4733 Obstructive sleep apnea (adult) (pediatric): Secondary | ICD-10-CM

## 2014-10-01 NOTE — Telephone Encounter (Signed)
I called Ms. Stohr this evening to discuss the results of sleep study which showed profound sleep apnea and O2 saturation nadir to 72%.  They request that we use [facility] for the Resmed 10 autotitrating CPAP machine.  We will change from [place] to [facility].  Will cc Caryl Pina to help assist arranging the change to [facility] in [place].

## 2014-10-01 NOTE — Telephone Encounter (Signed)
Pt wants cpap through [facility] in [place], not [facility].  Will replace order with specific instructions for [facility].  Nothing further needed.

## 2014-10-02 NOTE — Addendum Note (Signed)
Addended by: Len Blalock on: 10/02/2014 05:04 PM   Modules accepted: Orders

## 2014-10-02 NOTE — Telephone Encounter (Signed)
Order placed for cpap through [facility].  Nothing further needed.

## 2014-10-07 ENCOUNTER — Telehealth: Payer: Self-pay | Admitting: Pulmonary Disease

## 2014-10-07 ENCOUNTER — Ambulatory Visit: Payer: Self-pay | Admitting: Internal Medicine

## 2014-10-07 NOTE — Telephone Encounter (Signed)
I have a cmn for cpap set up, that has been put in Dr Lake Bells green folder to be signed. Verdie Mosher

## 2014-10-07 NOTE — Telephone Encounter (Signed)
Called and spoke to [place]. Anderson Malta needed clarification on CPAP order that was sent on 10/02/14. Informed jennifer the order was for autotitrate with the CPAP-resmed 10. Anderson Malta stated she sent two CMNs-one for auto pressure and one for set pressure of 10. Anderson Malta stated will need Dr. Lake Bells to sign the CMN that has the autotitration and not the CMN that has a set pressure of 10.   Will forward to Alida to make aware.

## 2014-10-07 NOTE — Telephone Encounter (Signed)
[phone] Stacie [name]

## 2014-10-07 NOTE — Telephone Encounter (Addendum)
lmtcb for [employer].

## 2014-10-16 LAB — AFB CULTURE WITH SMEAR (NOT AT ARMC): Acid Fast Smear: NONE SEEN

## 2014-10-21 ENCOUNTER — Encounter: Payer: Self-pay | Admitting: Pulmonary Disease

## 2014-10-21 ENCOUNTER — Ambulatory Visit (INDEPENDENT_AMBULATORY_CARE_PROVIDER_SITE_OTHER): Payer: BLUE CROSS/BLUE SHIELD | Admitting: Pulmonary Disease

## 2014-10-21 VITALS — BP 140/80 | HR 114 | Ht 68.0 in | Wt 187.0 lb

## 2014-10-21 DIAGNOSIS — D869 Sarcoidosis, unspecified: Secondary | ICD-10-CM

## 2014-10-21 DIAGNOSIS — J4540 Moderate persistent asthma, uncomplicated: Secondary | ICD-10-CM

## 2014-10-21 DIAGNOSIS — D860 Sarcoidosis of lung: Secondary | ICD-10-CM

## 2014-10-21 MED ORDER — GUAIFENESIN-CODEINE 100-10 MG/5ML PO SOLN: 10.0000 mL | Freq: Four times a day (QID) | ORAL | Status: DC | PRN

## 2014-10-21 MED ORDER — PAROXETINE HCL 10 MG PO TABS: 10.0000 mg | ORAL_TABLET | Freq: Every day | ORAL | Status: DC

## 2014-10-21 NOTE — Assessment & Plan Note (Signed)
As noted previously her sarcoid has not shown signs of progression in recent months as her PFT's have remained stable.  Plan: -Stop Plaquenil as I do not think it is helping and may be contributing to her fatigue -Repeat PFTs now to ensure her ongoing fatigue has nothing to do with sarcoid

## 2014-10-21 NOTE — Patient Instructions (Signed)
Stop taking Plaquenil and QVar Keep using CPAP at night We will arrange an overnight oximetry test Take paxil at night We will call you with the blood test results Take the cough syrup during the daytime We will help arrange a follow up with a PCP at the [facility] office We will see you back in 6 weeks or sooner

## 2014-10-21 NOTE — Assessment & Plan Note (Signed)
She clearly has moderate asthma but it does not sound that this is been flaring recently. She is recovering from an episode of bronchitis and is currently taking an antibiotic prescribed by her ear nose and throat physician. I do not believe the asthma is the sole explanation for her fatigue.  Plan: -Stop Qvar as I do not think it is helping -Repeat test as above -Prescribed cough syrup with codeine to help with the cough from the recent episode of bronchitis -Continue Advair

## 2014-10-21 NOTE — Progress Notes (Signed)
Subjective:    Patient ID: Stacie [name], female    DOB: 15-Oct-1956, 59 y.o.   MRN: [id number]   Synopsis: Stacie [name] first saw the [facility] pulmonary clinic in May 2014 for evaluation of sarcoidosis. She was diagnosed with sarcoid in 2009 at the [facility] via an EBUS guided fine needle aspiration of a hilar lymph node as well as an endobronchial biopsy which showed noncaseating granulomas with negative special stains for infectious organisms. She was treated off and on with steroids from 2009 2 2014 and then in 2014 was started on methotrexate in addition to Plaquenil. She also has chronic kidney disease which is believed to be directly related to sarcoidosis.  She had no improvement with methotrexate and plaquenil and her TLC and DLCO declined slightly so in January 2015 she was started on imuran but this had to be held because of increasing LFTs. She ended up seeing Dr. Lenise Arena at [facility] in 01/2014 for her sarcoid.  He felt like her symptoms were more due to Asthma than Sarcoid.  Plaquenil was restarted, prednisone tapered, and Advair changed to QVar.  HPI Chief Complaint  Patient presents with  . Follow-up    pt c/o cough, fatigue, sob, she has been on cpap for 2.5 weeks she wearing 6-7 hrs nightly. She is still waiting for Pulm Rehab.    She continues to suffer with fatigue. Since the last visit she has been using her CPAP machine at least 6 hours every night but she says that it is not helping. She continues to have dyspnea with minimal exertion. She recently had an episode of bronchitis which is been treated with Levaquin. She has about 5 days left. She also has been suffering from urinary tract infection. She says that she feels depressed though she doesn't know why. She does not have any problems and underlying aside from her health problems. She does not have any energy to get up and walk around. She does not sleep well at night. She denies  chest pain. She has not heard back yet from pulmonary rehabilitation.  Past Medical History  Diagnosis Date  . Sarcoid      Review of Systems  Constitutional: Positive for fatigue. Negative for fever and chills.  HENT: Negative for congestion, nosebleeds, postnasal drip, rhinorrhea and sinus pressure.   Respiratory: Positive for cough. Negative for shortness of breath and wheezing.   Cardiovascular: Negative for chest pain, palpitations and leg swelling.       Objective:   Physical Exam Filed Vitals:   10/21/14 1212  BP: 140/80  Pulse: 114  Height: _0  (1.727 m)  Weight: 187 lb (84.823 kg)  SpO2: 97%  RA  Gen: fatigued appearing, no acute distress HEENT: NCAT, EOMi, OP clear without thrush PULM: CTA B todayI start and CV: RRR, no mgr, no JVD AB: BS+, soft, nontender Ext: warm, no edema, no clubbing, no cyanosis  Diagnosed 2009 [facility] with EBUS FNA of a hilar lymph node as well as an endobronchial lesion showing noncaseating granulomas, special stains were negative for fungal or AFB Prednisone off and on 2003-2014 11/2012 MTX/HCQ started [facility] Kidney involvement 03/12/2013 6 MW > 1500 feet, O2 saturation nadir 88% RA, HR max 132 03/2013 PFT > Ratio 66%, FEV 2.2L (82% pred), TLC 4.62 L (81% pred), DLCO 16.7 68% 09/2013 PFT > ratio 63%, FEV1 1.99> 2.08L with bronchodilator (76% predicted, 5%), TLC 4.25L (72% pred), DLCO 15.5 (64% pred) 10/2013 imuran started 09/2013 TPMT  activity 19 > normal 10/2013 Stop MTX, start Imuran> goal dose is 128m/day (renal adjustment); plan start 544mdaily, increase 2540mvery 3 weeks; weekly CBC/CMET until 125m100my, then q-12 weeks;  11/2013 had to stop imuran due to LFT abnormalities on two tries 02/24/2014 CXR> improved bilateral airspace disease, upper lobe scarring noted 02/24/2014 PFT> Ratio FEV1 1.99L (75% pred, 12% change with BD), TLC 4.04 L (71% pred), DLCO 16.9 (71% pred) 06/2014 Echo> LVEF 50-55%, normal global LVF funtion, mild MR,  estimated PA systolic 33.868.1ld TR, RV size normal 06/2014 PFT> ratio 55%, FEV1 1.39L (51% pred), FVC 2.52L (69%pred), TLCO 4.41L (75% pred), DLCO 64% pred      Assessment & Plan:   Pulmonary sarcoidosis As noted previously her sarcoid has not shown signs of progression in recent months as her PFT's have remained stable.  Plan: -Stop Plaquenil as I do not think it is helping and may be contributing to her fatigue -Repeat PFTs now to ensure her ongoing fatigue has nothing to do with sarcoid  Asthma, chronic She clearly has moderate asthma but it does not sound that this is been flaring recently. She is recovering from an episode of bronchitis and is currently taking an antibiotic prescribed by her ear nose and throat physician. I do not believe the asthma is the sole explanation for her fatigue.  Plan: -Stop Qvar as I do not think it is helping -Repeat test as above -Prescribed cough syrup with codeine to help with the cough from the recent episode of bronchitis -Continue Advair  Other malaise and fatigue This has been a very difficult problem to assess. This point I do not think that her sarcoid has worsened considering the normal oximetry on ambulation today and the relative stability of her lung function tests in the last year. I explained to her that the differential diagnosis of fatigue is quite broad. I was hoping that she would see some improvement with CPAP therapy but she has not. Lab work last year as well as an echocardiogram did not show evidence of heart lung or liver disease. Cortisol and thyroid tests were normal at that time.  I explained to her that I think she needs to have another physician take a look to try to get to the bottom of this. However, I want to try to help. I think she may be depressed but I feel that this may still be a diagnosis of exclusion.  Plan: -Continue CPAP -Repeat lab work to look for evidence of an underlying systemic process that could be  contributing -Start Paxil for depression -Refer to a primary care physician for further evaluation.  .  UMarland Kitchendated Medication List Outpatient Encounter Prescriptions as of 10/21/2014  Medication Sig  . ADVAIR HFA 115-21 MCG/ACT inhaler Inhale 2 puffs into the lungs 2 (two) times daily.  . alMarland Kitchenuterol (VENTOLIN HFA) 108 (90 BASE) MCG/ACT inhaler Inhale 2 puffs into the lungs every 6 (six) hours as needed for wheezing.  . asMarland Kitchenirin 81 MG tablet Take 81 mg by mouth daily.  . beclomethasone (QVAR) 80 MCG/ACT inhaler Inhale 2 puffs into the lungs 2 (two) times daily.  . GLMarland KitchenCOSAMINE PO Take 1 tablet by mouth daily.  . hydroxychloroquine (PLAQUENIL) 200 MG tablet Take 200 mg by mouth 2 (two) times daily.  . leMarland Kitchenofloxacin (LEVAQUIN) 750 MG tablet Take 1 tablet by mouth daily.  . leMarland Kitchenothyroxine (SYNTHROID, LEVOTHROID) 137 MCG tablet Take 137 mcg by mouth daily before breakfast.  . mometasone (NASONEX) 50 MCG/ACT nasal spray  Place 1 spray into the nose 2 (two) times daily.  . montelukast (SINGULAIR) 10 MG tablet Take 1 tablet (10 mg total) by mouth daily.  . Multiple Vitamin (MULTIVITAMIN) capsule Take 1 capsule by mouth 2 (two) times daily.   . nitrofurantoin, macrocrystal-monohydrate, (MACROBID) 100 MG capsule Take 100 mg by mouth 2 (two) times daily.  . ranitidine (ZANTAC) 150 MG tablet Take 150 mg by mouth at bedtime.

## 2014-10-21 NOTE — Progress Notes (Signed)
Quick Note:  Pt has ov today, this will be reviewed in clinic. ______

## 2014-10-21 NOTE — Assessment & Plan Note (Signed)
This has been a very difficult problem to assess. This point I do not think that her sarcoid has worsened considering the normal oximetry on ambulation today and the relative stability of her lung function tests in the last year. I explained to her that the differential diagnosis of fatigue is quite broad. I was hoping that she would see some improvement with CPAP therapy but she has not. Lab work last year as well as an echocardiogram did not show evidence of heart lung or liver disease. Cortisol and thyroid tests were normal at that time.  I explained to her that I think she needs to have another physician take a look to try to get to the bottom of this. However, I want to try to help. I think she may be depressed but I feel that this may still be a diagnosis of exclusion.  Plan: -Continue CPAP -Repeat lab work to look for evidence of an underlying systemic process that could be contributing -Start Paxil for depression -Refer to a primary care physician for further evaluation.

## 2014-10-23 ENCOUNTER — Other Ambulatory Visit: Payer: Self-pay | Admitting: Pulmonary Disease

## 2014-10-25 LAB — COMPREHENSIVE METABOLIC PANEL
ALT: 19 IU/L (ref 0–32)
AST: 26 IU/L (ref 0–40)
Albumin/Globulin Ratio: 1.2 (ref 1.1–2.5)
Albumin: 3.8 g/dL (ref 3.5–5.5)
Alkaline Phosphatase: 81 IU/L (ref 39–117)
BUN/Creatinine Ratio: 13 (ref 9–23)
BUN: 17 mg/dL (ref 6–24)
CO2: 26 mmol/L (ref 18–29)
Calcium: 10.6 mg/dL — ABNORMAL HIGH (ref 8.7–10.2)
Chloride: 99 mmol/L (ref 97–108)
Creatinine, Ser: 1.36 mg/dL — ABNORMAL HIGH (ref 0.57–1.00)
GFR calc Af Amer: 49 mL/min/{1.73_m2} — ABNORMAL LOW (ref >59)
GFR calc non Af Amer: 43 mL/min/{1.73_m2} — ABNORMAL LOW (ref >59)
Globulin, Total: 3.2 g/dL (ref 1.5–4.5)
Glucose: 106 mg/dL — ABNORMAL HIGH (ref 65–99)
Potassium: 3.8 mmol/L (ref 3.5–5.2)
Sodium: 143 mmol/L (ref 134–144)
Total Bilirubin: 0.4 mg/dL (ref 0.0–1.2)
Total Protein: 7 g/dL (ref 6.0–8.5)

## 2014-10-25 LAB — CBC WITH DIFFERENTIAL
Basophils Absolute: 0 10*3/uL (ref 0.0–0.2)
Basos: 1 %
Eos: 6 %
Eosinophils Absolute: 0.5 10*3/uL — ABNORMAL HIGH (ref 0.0–0.4)
HCT: 40.1 % (ref 34.0–46.6)
Hemoglobin: 14.8 g/dL (ref 11.1–15.9)
Immature Grans (Abs): 0 10*3/uL (ref 0.0–0.1)
Immature Granulocytes: 0 %
Lymphocytes Absolute: 1.6 10*3/uL (ref 0.7–3.1)
Lymphs: 20 %
MCH: 30.6 pg (ref 26.6–33.0)
MCHC: 36.9 g/dL — ABNORMAL HIGH (ref 31.5–35.7)
MCV: 83 fL (ref 79–97)
Monocytes Absolute: 0.9 10*3/uL (ref 0.1–0.9)
Monocytes: 11 %
Neutrophils Absolute: 4.9 10*3/uL (ref 1.4–7.0)
Neutrophils Relative %: 62 %
Platelets: 279 10*3/uL (ref 150–379)
RBC: 4.83 x10E6/uL (ref 3.77–5.28)
RDW: 13.5 % (ref 12.3–15.4)
WBC: 7.9 10*3/uL (ref 3.4–10.8)

## 2014-10-25 LAB — TSH: TSH: 1.57 u[IU]/mL (ref 0.450–4.500)

## 2014-10-28 ENCOUNTER — Telehealth: Payer: Self-pay

## 2014-10-28 ENCOUNTER — Ambulatory Visit: Payer: Self-pay | Admitting: Pulmonary Disease

## 2014-10-28 ENCOUNTER — Encounter: Payer: Self-pay | Admitting: Pulmonary Disease

## 2014-10-28 DIAGNOSIS — D860 Sarcoidosis of lung: Secondary | ICD-10-CM

## 2014-10-28 LAB — PULMONARY FUNCTION TEST

## 2014-10-28 NOTE — Telephone Encounter (Signed)
-----  Message from Juanito Doom, MD sent at 10/27/2014  5:55 PM EST ----- A, Please let her know that her calcium was slightly elevated.  This may be related to her sarcoid as she has had this once before.  I don't recommend any changes other than repeating the lab in two weeks (please order)   Please ask if she has a PCP yet in the area.    Thanks B

## 2014-10-28 NOTE — Telephone Encounter (Signed)
Pt aware of results/recs, pt scheduled for 1/26 at [place] for lab redraw.  Dr. Lake Bells, do you want all of the labs redrawn?  I can put them in but did not want pt to have them all drawn if it was not needed.  Thanks!

## 2014-10-29 NOTE — Telephone Encounter (Signed)
Just the bmet

## 2014-10-29 NOTE — Telephone Encounter (Signed)
bmet ordered.  Nothing further needed.

## 2014-10-30 ENCOUNTER — Telehealth: Payer: Self-pay | Admitting: Pulmonary Disease

## 2014-10-30 MED ORDER — GUAIFENESIN-CODEINE 100-10 MG/5ML PO SOLN
10.0000 mL | Freq: Four times a day (QID) | ORAL | Status: DC | PRN
Start: 2014-10-30 — End: 2014-11-19

## 2014-10-30 NOTE — Telephone Encounter (Signed)
Rx BQ gave her can be called in to her pharmacy. This has been done. Nothing further is needed.

## 2014-11-04 ENCOUNTER — Telehealth: Payer: Self-pay

## 2014-11-04 NOTE — Telephone Encounter (Signed)
Spoke with pt and relayed pft results, she states she feels 10X better this past week.  She states that members of her family had colds and she believed she had caught something from one of them, but is feeling much better.    Do you still want me to order the ct chest?

## 2014-11-04 NOTE — Telephone Encounter (Signed)
-----  Message from Juanito Doom, MD sent at 11/03/2014 11:24 PM EST ----- A, Please check to see how she is doing.  Let her know that her PFTs were slightly down compared to prior studies.  If she is feeling worse then I want her to have a high resolution CT chest to evaluate interstitial lung disease.   Thanks B

## 2014-11-06 NOTE — Telephone Encounter (Signed)
Nope, just what I needed to know

## 2014-11-11 ENCOUNTER — Other Ambulatory Visit (INDEPENDENT_AMBULATORY_CARE_PROVIDER_SITE_OTHER): Payer: BLUE CROSS/BLUE SHIELD

## 2014-11-11 DIAGNOSIS — D860 Sarcoidosis of lung: Secondary | ICD-10-CM

## 2014-11-11 DIAGNOSIS — R5383 Other fatigue: Secondary | ICD-10-CM

## 2014-11-11 DIAGNOSIS — R5381 Other malaise: Secondary | ICD-10-CM

## 2014-11-11 LAB — COMPREHENSIVE METABOLIC PANEL
ALT: 15 U/L (ref 0–35)
AST: 23 U/L (ref 0–37)
Albumin: 3.8 g/dL (ref 3.5–5.2)
Alkaline Phosphatase: 88 U/L (ref 39–117)
BUN: 21 mg/dL (ref 6–23)
CO2: 26 mEq/L (ref 19–32)
Calcium: 10.4 mg/dL (ref 8.4–10.5)
Chloride: 108 mEq/L (ref 96–112)
Creatinine, Ser: 1.32 mg/dL — ABNORMAL HIGH (ref 0.40–1.20)
GFR: 43.7 mL/min — ABNORMAL LOW (ref >60.00)
Glucose, Bld: 140 mg/dL — ABNORMAL HIGH (ref 70–99)
Potassium: 3.6 mEq/L (ref 3.5–5.1)
Sodium: 148 mEq/L — ABNORMAL HIGH (ref 135–145)
Total Bilirubin: 0.4 mg/dL (ref 0.2–1.2)
Total Protein: 7.5 g/dL (ref 6.0–8.3)

## 2014-11-11 LAB — CBC WITH DIFFERENTIAL/PLATELET
Basophils Absolute: 0 10*3/uL (ref 0.0–0.1)
Basophils Relative: 0.6 % (ref 0.0–3.0)
Eosinophils Absolute: 0.4 10*3/uL (ref 0.0–0.7)
Eosinophils Relative: 6.3 % — ABNORMAL HIGH (ref 0.0–5.0)
HCT: 43.3 % (ref 36.0–46.0)
Hemoglobin: 14.8 g/dL (ref 12.0–15.0)
Lymphocytes Relative: 19.2 % (ref 12.0–46.0)
Lymphs Abs: 1.2 10*3/uL (ref 0.7–4.0)
MCHC: 34.2 g/dL (ref 30.0–36.0)
MCV: 82.8 fl (ref 78.0–100.0)
Monocytes Absolute: 0.6 10*3/uL (ref 0.1–1.0)
Monocytes Relative: 9.3 % (ref 3.0–12.0)
Neutro Abs: 4.1 10*3/uL (ref 1.4–7.7)
Neutrophils Relative %: 64.6 % (ref 43.0–77.0)
Platelets: 292 10*3/uL (ref 150.0–400.0)
RBC: 5.23 Mil/uL — ABNORMAL HIGH (ref 3.87–5.11)
RDW: 13.3 % (ref 11.5–15.5)
WBC: 6.4 10*3/uL (ref 4.0–10.5)

## 2014-11-11 LAB — TSH: TSH: 1.24 u[IU]/mL (ref 0.35–4.50)

## 2014-11-11 NOTE — Addendum Note (Signed)
Addended by: Karlene Einstein D on: 11/11/2014 10:52 AM   Modules accepted: Orders

## 2014-11-11 NOTE — Addendum Note (Signed)
Addended by: Karlene Einstein D on: 11/11/2014 02:32 PM   Modules accepted: Orders

## 2014-11-13 LAB — IGE: IgE (Immunoglobulin E), Serum: 45 kU/L (ref <115)

## 2014-11-17 ENCOUNTER — Encounter: Payer: Self-pay | Admitting: Pulmonary Disease

## 2014-11-17 LAB — ASPERGILLUS IGE PANEL

## 2014-11-17 LAB — CULTURE, BLOOD (SINGLE): Organism ID, Bacteria: NO GROWTH

## 2014-11-18 ENCOUNTER — Ambulatory Visit: Payer: Self-pay | Admitting: Urology

## 2014-11-19 ENCOUNTER — Ambulatory Visit (INDEPENDENT_AMBULATORY_CARE_PROVIDER_SITE_OTHER): Payer: BLUE CROSS/BLUE SHIELD | Admitting: Pulmonary Disease

## 2014-11-19 ENCOUNTER — Encounter: Payer: Self-pay | Admitting: Pulmonary Disease

## 2014-11-19 VITALS — BP 130/72 | HR 106 | Ht 68.0 in | Wt 179.0 lb

## 2014-11-19 DIAGNOSIS — D860 Sarcoidosis of lung: Secondary | ICD-10-CM

## 2014-11-19 DIAGNOSIS — R059 Cough, unspecified: Secondary | ICD-10-CM

## 2014-11-19 DIAGNOSIS — G4733 Obstructive sleep apnea (adult) (pediatric): Secondary | ICD-10-CM

## 2014-11-19 DIAGNOSIS — R05 Cough: Secondary | ICD-10-CM

## 2014-11-19 MED ORDER — HYDROCODONE-CHLORPHENIRAMINE 5-4 MG/5ML PO SOLN
5.0000 mL | Freq: Two times a day (BID) | ORAL | Status: DC | PRN
Start: 2014-11-19 — End: 2014-11-26

## 2014-11-19 MED ORDER — CHLORPHENIRAMINE-CODEINE 2-10 MG/5ML PO LIQD: 5.0000 mL | Freq: Two times a day (BID) | ORAL | Status: DC | PRN

## 2014-11-19 NOTE — Patient Instructions (Signed)
We will arrange a CT scan of your chest We will arrange 2L O2 with your CPAP machine and another overnight oximetry test Take prilosec in the morning (63m) and pepcid at night Keep take the Advair as you are doing We will see you back in 2 months or sooner if needed

## 2014-11-19 NOTE — Assessment & Plan Note (Signed)
Despite feeling much better and a stability in her 6 minute walk test, it bothers me that her total lung capacity and DLCO have decreased and for the first time we have seen evidence of desaturation on ambulation. Her O2 saturation was noted to drop to 83% with ambulation in the pulmonary rehabilitation clinic a few weeks ago.  Overall, this does bother me that her pulmonary sarcoid could be progressing. As noted in multiple notes previously she has been treated aggressively with multiple agents including Imuran, methotrexate, plaquenil, and prednisone. Primary problem over the last year to year and a half is been worsening airflow obstruction which we have treated as if it was asthma.  I explained to her today that if in fact she does have evidence of progressive pulmonary sarcoid then we would need to consider treatment with something like CellCept. I hope that we don't have to get to that. I also explained to her today that I'm hopeful that these 2 data points (January PFT and ambulatory oximetry) are just anomalies but we can't ignore them at this time.  Plan: -High resolution CT chest -Continue exercise -Continue treatment of asthma with inhaled medications -Follow-up in 6-8 weeks

## 2014-11-19 NOTE — Assessment & Plan Note (Signed)
This continues to be quite a problem and is related to acid reflux, sarcoid, and asthma.  Plan:  -use combination codeine/chlorpheniramine as needed for cough -Maximize acid reflux therapy by adding PPI during the day, H2 blocker at night, reviewed gastroesophageal reflux lifestyle modification changes

## 2014-11-19 NOTE — Progress Notes (Signed)
Subjective:    Patient ID: Stacie [name], female    DOB: 05/26/1956, 59 y.o.   MRN: [id number]   Synopsis: Stacie [name] first saw the [facility] pulmonary clinic in May 2014 for evaluation of sarcoidosis. She was diagnosed with sarcoid in 2009 at the [place] via an EBUS guided fine needle aspiration of a hilar lymph node as well as an endobronchial biopsy which showed noncaseating granulomas with negative special stains for infectious organisms. She was treated off and on with steroids from 2009 2 2014 and then in 2014 was started on methotrexate in addition to Plaquenil. She also has chronic kidney disease which is believed to be directly related to sarcoidosis.  She had no improvement with methotrexate and plaquenil and her TLC and DLCO declined slightly so in January 2015 she was started on imuran but this had to be held because of increasing LFTs. She ended up seeing Dr. Lenise [name] at [facility] in 01/2014 for her sarcoid.  He felt like her symptoms were more due to Asthma than Sarcoid.  Plaquenil was restarted, prednisone tapered, and Advair changed to QVar.  HPI Chief Complaint  Patient presents with  . Follow-up    follow up on cpap.  Pt states she is doing well with her cpap, having some problems the past few days with mask leakage.     Stacie [name] says the CPAP is helping a lot.  Howeer she has been told that she has high leak with the CPAP.  She is using a nose mask.  She feels like her energy level has improved some but she hasn't seen a big improveemnt with her shortness of breath.  She has been going to pulmonary rehab which she says is hard.  She is still coughing a lot.  Keeping a candy in her mouth helps some.  But she feels like the cough continues despite the cough syrup.  Past Medical History  Diagnosis Date  . Sarcoid      Review of Systems  Constitutional: Positive for fatigue. Negative for fever and chills.  HENT: Negative for  congestion, nosebleeds, postnasal drip, rhinorrhea and sinus pressure.   Respiratory: Positive for cough. Negative for shortness of breath and wheezing.   Cardiovascular: Negative for chest pain, palpitations and leg swelling.       Objective:   Physical Exam Filed Vitals:   11/19/14 0855  BP: 130/72  Pulse: 106  Height: _0  (1.727 m)  Weight: 179 lb (81.194 kg)  SpO2: 95%  RA  Gen: fatigued appearing, no acute distress HEENT: NCAT, EOMi, OP clear without thrush PULM: CTA B today CV: RRR, no mgr, no JVD AB: BS+, soft, nontender Ext: warm, no edema, no clubbing, no cyanosis  Diagnosed 2009 [facility] with EBUS FNA of a hilar lymph node as well as an endobronchial lesion showing noncaseating granulomas, special stains were negative for fungal or AFB Prednisone off and on 2003-2014 11/2012 MTX/HCQ started [facility] Kidney involvement 03/12/2013 6 MW > 1500 feet, O2 saturation nadir 88% RA, HR max 132 03/2013 PFT > Ratio 66%, FEV 2.2L (82% pred), TLC 4.62 L (81% pred), DLCO 16.7 68% 09/2013 PFT > ratio 63%, FEV1 1.99> 2.08L with bronchodilator (76% predicted, 5%), TLC 4.25L (72% pred), DLCO 15.5 (64% pred) 10/2013 imuran started 09/2013 TPMT activity 19 > normal 10/2013 Stop MTX, start Imuran> goal dose is 160m/day (renal adjustment); plan start 527mdaily, increase 2548mvery 3 weeks; weekly CBC/CMET until 125m16my, then q-12 weeks;  11/2013 had to stop imuran due to LFT abnormalities on two tries 02/24/2014 CXR> improved bilateral airspace disease, upper lobe scarring noted 02/24/2014 PFT> Ratio FEV1 1.99L (75% pred, 12% change with BD), TLC 4.04 L (71% pred), DLCO 16.9 (71% pred) 06/2014 Echo> LVEF 50-55%, normal global LVF funtion, mild MR, estimated PA systolic 76.7, mild TR, RV size normal 06/2014 PFT> ratio 55%, FEV1 1.39L (51% pred), FVC 2.52L (69%pred), TLCO 4.41L (75% pred), DLCO 64% pred 10/2014 PFT> Ratio 64% FEV1 1.52L (57% pred), FVC 2.39L (67% pred), TLC 3.58 (63% pred), DLCO 12.7 (53%  pred) 10/2014 6MW> 1413fet O2 83% RA     Assessment & Plan:   Obstructive sleep apnea She is feeling much better since starting CPAP therapy for her obstructive sleep apnea. However, her polysomnogram did show an O2 saturation nadir of 73% even while on CPAP. So we will start supplemental oxygen and repeat an oximetry test. Otherwise, we will plan on continuing CPAP therapy and will request a download for compliance.   Pulmonary sarcoidosis Despite feeling much better and a stability in her 6 minute walk test, it bothers me that her total lung capacity and DLCO have decreased and for the first time we have seen evidence of desaturation on ambulation. Her O2 saturation was noted to drop to 83% with ambulation in the pulmonary rehabilitation clinic a few weeks ago.  Overall, this does bother me that her pulmonary sarcoid could be progressing. As noted in multiple notes previously she has been treated aggressively with multiple agents including Imuran, methotrexate, plaquenil, and prednisone. Primary problem over the last year to year and a half is been worsening airflow obstruction which we have treated as if it was asthma.  I explained to her today that if in fact she does have evidence of progressive pulmonary sarcoid then we would need to consider treatment with something like CellCept. I hope that we don't have to get to that. I also explained to her today that I'm hopeful that these 2 data points (January PFT and ambulatory oximetry) are just anomalies but we can't ignore them at this time.  Plan: -High resolution CT chest -Continue exercise -Continue treatment of asthma with inhaled medications -Follow-up in 6-8 weeks   Cough This continues to be quite a problem and is related to acid reflux, sarcoid, and asthma.  Plan:  -use combination codeine/chlorpheniramine as needed for cough -Maximize acid reflux therapy by adding PPI during the day, H2 blocker at night, reviewed  gastroesophageal reflux lifestyle modification changes    . Greater than 25 minutes were spent in direct sling as well as formulating her plan today in clinic Updated Medication List Outpatient Encounter Prescriptions as of 11/19/2014  Medication Sig  . ADVAIR HFA 115-21 MCG/ACT inhaler Inhale 2 puffs into the lungs 2 (two) times daily.  .Marland Kitchenalbuterol (VENTOLIN HFA) 108 (90 BASE) MCG/ACT inhaler Inhale 2 puffs into the lungs every 6 (six) hours as needed for wheezing.  .Marland Kitchenaspirin 81 MG tablet Take 81 mg by mouth daily.  .Marland KitchenGLUCOSAMINE PO Take 1 tablet by mouth daily.  .Marland KitchenguaiFENesin-codeine 100-10 MG/5ML syrup Take 10 mLs by mouth every 6 (six) hours as needed for cough.  . levothyroxine (SYNTHROID, LEVOTHROID) 137 MCG tablet Take 137 mcg by mouth daily before breakfast.  . mometasone (NASONEX) 50 MCG/ACT nasal spray Place 1 spray into the nose 2 (two) times daily.  . Multiple Vitamin (MULTIVITAMIN) capsule Take 1 capsule by mouth 2 (two) times daily.   .Marland KitchenPARoxetine (  PAXIL) 10 MG tablet Take 1 tablet (10 mg total) by mouth at bedtime.  . ranitidine (ZANTAC) 150 MG tablet Take 150 mg by mouth at bedtime.  . [DISCONTINUED] levofloxacin (LEVAQUIN) 750 MG tablet Take 1 tablet by mouth daily.  . [DISCONTINUED] montelukast (SINGULAIR) 10 MG tablet Take 1 tablet (10 mg total) by mouth daily. (Patient not taking: Reported on 11/19/2014)

## 2014-11-19 NOTE — Assessment & Plan Note (Signed)
She is feeling much better since starting CPAP therapy for her obstructive sleep apnea. However, her polysomnogram did show an O2 saturation nadir of 73% even while on CPAP. So we will start supplemental oxygen and repeat an oximetry test. Otherwise, we will plan on continuing CPAP therapy and will request a download for compliance.

## 2014-11-24 ENCOUNTER — Telehealth: Payer: Self-pay | Admitting: Pulmonary Disease

## 2014-11-24 DIAGNOSIS — R05 Cough: Secondary | ICD-10-CM

## 2014-11-24 DIAGNOSIS — D860 Sarcoidosis of lung: Secondary | ICD-10-CM

## 2014-11-24 DIAGNOSIS — R059 Cough, unspecified: Secondary | ICD-10-CM

## 2014-11-24 MED ORDER — AMOXICILLIN 500 MG PO TABS: 500.0000 mg | ORAL_TABLET | Freq: Three times a day (TID) | ORAL | Status: DC

## 2014-11-24 NOTE — Telephone Encounter (Signed)
Pt c/o cough with yellow/Philip Kotlyar mucus and low grade fever (99-100) x 4 days.  Pt states that her husband-who is an ENT MD-advised patient to request a sputum culture as he feels this is an infection deep in her lungs causing her symptoms.   Allergies  Allergen Reactions  . Corn Oil Other (See Comments)    Other reaction(s): HEADACHE Headache, diarrhea  . Nitrofuran Derivatives     Other reaction(s): Nausea And Vomiting (Macrobid)- took this med recently with no problems 11/19/14  . Tramadol     Nausea and vomiting  . Sulfa Antibiotics Rash    As an infant   [facility] [address] [address]  Please advise Dr Lake Bells. Thanks.

## 2014-11-24 NOTE — Telephone Encounter (Signed)
I agree she needs a sputum culture, she also needs a chest x-ray and a clinic visit.  Please order both See if there are open spots with Dr. Stevenson Clinch in [place] as I don't have clinic today.   If no availability with Dr. Stevenson Clinch today then call in amoxicillin 538m q8h x5 days to be taken with a probiotic.

## 2014-11-24 NOTE — Telephone Encounter (Signed)
Spoke with pt. Aware of rec's per BQ.  Pt added onto schedule tomorrow with BQ- aware to come to [place].  Pt to have chest cxr prior to appt- aware to arrive about 245/3pm to have this done. Appt is at 3:15.  Pt states that she is going to get her husband to bring her a sterile cup home so that she can get a sputum sample.  Pt aware to keep in fridge until she comes in for visit.  Rx sent to pharmacy.   Please advise Dr Lake Bells, what tests would you like to have done for the Sputum collection? Thanks.

## 2014-11-25 ENCOUNTER — Other Ambulatory Visit: Payer: BLUE CROSS/BLUE SHIELD

## 2014-11-25 ENCOUNTER — Ambulatory Visit (INDEPENDENT_AMBULATORY_CARE_PROVIDER_SITE_OTHER)
Admission: RE | Admit: 2014-11-25 | Discharge: 2014-11-25 | Disposition: A | Discharge status: Home / Self Care | Payer: BLUE CROSS/BLUE SHIELD | Source: Ambulatory Visit | Attending: Pulmonary Disease | Admitting: Pulmonary Disease

## 2014-11-25 ENCOUNTER — Encounter (HOSPITAL_COMMUNITY): Payer: Self-pay | Admitting: *Deleted

## 2014-11-25 ENCOUNTER — Ambulatory Visit: Payer: BLUE CROSS/BLUE SHIELD | Admitting: Internal Medicine

## 2014-11-25 ENCOUNTER — Encounter: Payer: Self-pay | Admitting: Pulmonary Disease

## 2014-11-25 ENCOUNTER — Inpatient Hospital Stay (HOSPITAL_COMMUNITY): Payer: BLUE CROSS/BLUE SHIELD

## 2014-11-25 ENCOUNTER — Inpatient Hospital Stay (HOSPITAL_COMMUNITY)
Admission: AD | Admit: 2014-11-25 | Discharge: 2014-11-26 | DRG: 195 | Disposition: A | Discharge status: Home / Self Care | Payer: BLUE CROSS/BLUE SHIELD | Source: Ambulatory Visit | Attending: Pulmonary Disease | Admitting: Pulmonary Disease

## 2014-11-25 ENCOUNTER — Ambulatory Visit (INDEPENDENT_AMBULATORY_CARE_PROVIDER_SITE_OTHER): Payer: BLUE CROSS/BLUE SHIELD | Admitting: Pulmonary Disease

## 2014-11-25 VITALS — BP 156/92 | HR 141 | Temp 98.5°F | Ht 68.0 in | Wt 175.0 lb

## 2014-11-25 DIAGNOSIS — R000 Tachycardia, unspecified: Secondary | ICD-10-CM

## 2014-11-25 DIAGNOSIS — N189 Chronic kidney disease, unspecified: Secondary | ICD-10-CM | POA: Diagnosis present

## 2014-11-25 DIAGNOSIS — Z79899 Other long term (current) drug therapy: Secondary | ICD-10-CM | POA: Diagnosis not present

## 2014-11-25 DIAGNOSIS — R0602 Shortness of breath: Secondary | ICD-10-CM | POA: Diagnosis present

## 2014-11-25 DIAGNOSIS — I471 Supraventricular tachycardia: Secondary | ICD-10-CM

## 2014-11-25 DIAGNOSIS — J45909 Unspecified asthma, uncomplicated: Secondary | ICD-10-CM | POA: Diagnosis present

## 2014-11-25 DIAGNOSIS — J189 Pneumonia, unspecified organism: Principal | ICD-10-CM | POA: Diagnosis present

## 2014-11-25 DIAGNOSIS — R059 Cough, unspecified: Secondary | ICD-10-CM

## 2014-11-25 DIAGNOSIS — D869 Sarcoidosis, unspecified: Secondary | ICD-10-CM | POA: Diagnosis present

## 2014-11-25 DIAGNOSIS — D860 Sarcoidosis of lung: Secondary | ICD-10-CM

## 2014-11-25 DIAGNOSIS — R05 Cough: Secondary | ICD-10-CM

## 2014-11-25 LAB — CBC WITH DIFFERENTIAL/PLATELET
Basophils Absolute: 0.1 10*3/uL (ref 0.0–0.1)
Basophils Relative: 1 % (ref 0–1)
Eosinophils Absolute: 0.4 10*3/uL (ref 0.0–0.7)
Eosinophils Relative: 4 % (ref 0–5)
HCT: 43.2 % (ref 36.0–46.0)
Hemoglobin: 14.9 g/dL (ref 12.0–15.0)
Lymphocytes Relative: 22 % (ref 12–46)
Lymphs Abs: 1.8 10*3/uL (ref 0.7–4.0)
MCH: 29 pg (ref 26.0–34.0)
MCHC: 34.5 g/dL (ref 30.0–36.0)
MCV: 84 fL (ref 78.0–100.0)
Monocytes Absolute: 0.9 10*3/uL (ref 0.1–1.0)
Monocytes Relative: 10 % (ref 3–12)
Neutro Abs: 5.3 10*3/uL (ref 1.7–7.7)
Neutrophils Relative %: 63 % (ref 43–77)
Platelets: 288 10*3/uL (ref 150–400)
RBC: 5.14 MIL/uL — ABNORMAL HIGH (ref 3.87–5.11)
RDW: 12.9 % (ref 11.5–15.5)
WBC: 8.4 10*3/uL (ref 4.0–10.5)

## 2014-11-25 LAB — URINE MICROSCOPIC-ADD ON

## 2014-11-25 LAB — COMPREHENSIVE METABOLIC PANEL
ALT: 21 U/L (ref 0–35)
AST: 28 U/L (ref 0–37)
Albumin: 3.7 g/dL (ref 3.5–5.2)
Alkaline Phosphatase: 79 U/L (ref 39–117)
Anion gap: 9 (ref 5–15)
BUN: 23 mg/dL (ref 6–23)
CO2: 32 mmol/L (ref 19–32)
Calcium: 12.7 mg/dL — ABNORMAL HIGH (ref 8.4–10.5)
Chloride: 99 mmol/L (ref 96–112)
Creatinine, Ser: 1.53 mg/dL — ABNORMAL HIGH (ref 0.50–1.10)
GFR calc Af Amer: 42 mL/min — ABNORMAL LOW (ref >90)
GFR calc non Af Amer: 36 mL/min — ABNORMAL LOW (ref >90)
Glucose, Bld: 102 mg/dL — ABNORMAL HIGH (ref 70–99)
Potassium: 3.1 mmol/L — ABNORMAL LOW (ref 3.5–5.1)
Sodium: 140 mmol/L (ref 135–145)
Total Bilirubin: 0.7 mg/dL (ref 0.3–1.2)
Total Protein: 7.9 g/dL (ref 6.0–8.3)

## 2014-11-25 LAB — STREP PNEUMONIAE URINARY ANTIGEN: Strep Pneumo Urinary Antigen: NEGATIVE

## 2014-11-25 LAB — PROTIME-INR
INR: 1.14 (ref 0.00–1.49)
Prothrombin Time: 14.7 seconds (ref 11.6–15.2)

## 2014-11-25 LAB — URINALYSIS, ROUTINE W REFLEX MICROSCOPIC
Bilirubin Urine: NEGATIVE
Glucose, UA: NEGATIVE mg/dL
Hgb urine dipstick: NEGATIVE
Ketones, ur: NEGATIVE mg/dL
Nitrite: NEGATIVE
Protein, ur: NEGATIVE mg/dL
Specific Gravity, Urine: 1.014 (ref 1.005–1.030)
Urobilinogen, UA: 0.2 mg/dL (ref 0.0–1.0)
pH: 6.5 (ref 5.0–8.0)

## 2014-11-25 LAB — BRAIN NATRIURETIC PEPTIDE: B Natriuretic Peptide: 21.4 pg/mL (ref 0.0–100.0)

## 2014-11-25 LAB — TROPONIN I: Troponin I: <0.03 ng/mL (ref <0.031)

## 2014-11-25 MED ORDER — ENSURE COMPLETE PO LIQD
237.0000 mL | Freq: Two times a day (BID) | ORAL | Status: DC
  Administered 2014-11-26: 237 mL via ORAL

## 2014-11-25 MED ORDER — PAROXETINE HCL 10 MG PO TABS
10.0000 mg | ORAL_TABLET | Freq: Every day | ORAL | Status: DC
  Administered 2014-11-25: 10 mg via ORAL
  Filled 2014-11-25 (×3): qty 1

## 2014-11-25 MED ORDER — SACCHAROMYCES BOULARDII 250 MG PO CAPS
250.0000 mg | ORAL_CAPSULE | Freq: Every day | ORAL | Status: DC
  Administered 2014-11-25 – 2014-11-26 (×2): 250 mg via ORAL
  Filled 2014-11-25 (×2): qty 1

## 2014-11-25 MED ORDER — SODIUM CHLORIDE 0.9 % IJ SOLN
3.0000 mL | Freq: Two times a day (BID) | INTRAMUSCULAR | Status: DC
  Administered 2014-11-25: 3 mL via INTRAVENOUS

## 2014-11-25 MED ORDER — AZITHROMYCIN 500 MG IV SOLR
500.0000 mg | INTRAVENOUS | Status: DC
  Administered 2014-11-25: 500 mg via INTRAVENOUS
  Filled 2014-11-25: qty 500

## 2014-11-25 MED ORDER — FLUTICASONE PROPIONATE 50 MCG/ACT NA SUSP
1.0000 | Freq: Every day | NASAL | Status: DC
  Administered 2014-11-25 – 2014-11-26 (×2): 1 via NASAL
  Filled 2014-11-25 (×2): qty 16

## 2014-11-25 MED ORDER — POTASSIUM CHLORIDE CRYS ER 20 MEQ PO TBCR
40.0000 meq | EXTENDED_RELEASE_TABLET | Freq: Once | ORAL | Status: AC
  Administered 2014-11-25: 40 meq via ORAL
  Filled 2014-11-25: qty 2

## 2014-11-25 MED ORDER — CEFTRIAXONE SODIUM IN DEXTROSE 20 MG/ML IV SOLN
1.0000 g | INTRAVENOUS | Status: DC
  Administered 2014-11-25: 1 g via INTRAVENOUS
  Filled 2014-11-25 (×2): qty 50

## 2014-11-25 MED ORDER — SODIUM CHLORIDE 0.9 % IV SOLN
INTRAVENOUS | Status: DC
  Administered 2014-11-25: 18:00:00 via INTRAVENOUS
  Administered 2014-11-26: 75 mL/h via INTRAVENOUS

## 2014-11-25 MED ORDER — SACCHAROMYCES BOULARDII 250 MG PO CAPS: 250.0000 mg | ORAL_CAPSULE | Freq: Two times a day (BID) | ORAL | Status: DC

## 2014-11-25 MED ORDER — HEPARIN SODIUM (PORCINE) 5000 UNIT/ML IJ SOLN
5000.0000 [IU] | Freq: Three times a day (TID) | INTRAMUSCULAR | Status: DC
  Administered 2014-11-25 – 2014-11-26 (×2): 5000 [IU] via SUBCUTANEOUS
  Filled 2014-11-25 (×2): qty 1

## 2014-11-25 MED ORDER — HYDROCOD POLST-CHLORPHEN POLST 10-8 MG/5ML PO LQCR
2.5000 mL | Freq: Two times a day (BID) | ORAL | Status: DC | PRN
  Administered 2014-11-26: 2.5 mL via ORAL
  Filled 2014-11-25: qty 5

## 2014-11-25 MED ORDER — ONDANSETRON HCL 4 MG/2ML IJ SOLN: 4.0000 mg | Freq: Four times a day (QID) | INTRAMUSCULAR | Status: DC | PRN

## 2014-11-25 MED ORDER — ALBUTEROL SULFATE (2.5 MG/3ML) 0.083% IN NEBU: 2.5000 mg | INHALATION_SOLUTION | RESPIRATORY_TRACT | Status: DC | PRN

## 2014-11-25 MED ORDER — SENNOSIDES-DOCUSATE SODIUM 8.6-50 MG PO TABS
1.0000 | ORAL_TABLET | Freq: Every evening | ORAL | Status: DC | PRN
  Administered 2014-11-25: 1 via ORAL
  Filled 2014-11-25: qty 1

## 2014-11-25 MED ORDER — FAMOTIDINE 20 MG PO TABS
20.0000 mg | ORAL_TABLET | Freq: Every day | ORAL | Status: DC
  Administered 2014-11-25: 20 mg via ORAL
  Filled 2014-11-25 (×2): qty 1

## 2014-11-25 MED ORDER — ACETAMINOPHEN 325 MG PO TABS
650.0000 mg | ORAL_TABLET | Freq: Four times a day (QID) | ORAL | Status: DC | PRN
  Administered 2014-11-26: 650 mg via ORAL
  Filled 2014-11-25: qty 2

## 2014-11-25 MED ORDER — ASPIRIN 81 MG PO CHEW
81.0000 mg | CHEWABLE_TABLET | Freq: Every day | ORAL | Status: DC
  Administered 2014-11-26: 81 mg via ORAL
  Filled 2014-11-25 (×3): qty 1

## 2014-11-25 MED ORDER — ACETAMINOPHEN 650 MG RE SUPP: 650.0000 mg | Freq: Four times a day (QID) | RECTAL | Status: DC | PRN

## 2014-11-25 MED ORDER — ONDANSETRON HCL 4 MG PO TABS: 4.0000 mg | ORAL_TABLET | Freq: Four times a day (QID) | ORAL | Status: DC | PRN

## 2014-11-25 MED ORDER — MOMETASONE FURO-FORMOTEROL FUM 100-5 MCG/ACT IN AERO
2.0000 | INHALATION_SPRAY | Freq: Two times a day (BID) | RESPIRATORY_TRACT | Status: DC
  Administered 2014-11-26: 2 via RESPIRATORY_TRACT
  Filled 2014-11-25 (×2): qty 8.8

## 2014-11-25 MED ORDER — LEVOTHYROXINE SODIUM 137 MCG PO TABS
137.0000 ug | ORAL_TABLET | Freq: Every day | ORAL | Status: DC
  Administered 2014-11-26: 137 ug via ORAL
  Filled 2014-11-25 (×2): qty 1

## 2014-11-25 NOTE — Telephone Encounter (Signed)
Order placed for sputum culture Nothing further needed.

## 2014-11-25 NOTE — Progress Notes (Signed)
Potassium 3.1 and BP 160/93. Dr.Alva made aware. New order placed. Will continue to monitor closely.

## 2014-11-25 NOTE — H&P (Signed)
Patient ID: Stacie [name], female DOB: November 03, 1955, 59 y.o. MRN: [id number]   Synopsis: Stacie [name] first saw the [facility] pulmonary clinic in May 2014 for evaluation of sarcoidosis. She was diagnosed with sarcoid in 2009 at the [place] via an EBUS guided fine needle aspiration of a hilar lymph node as well as an endobronchial biopsy which showed noncaseating granulomas with negative special stains for infectious organisms. She was treated off and on with steroids from 2009 2 2014 and then in 2014 was started on methotrexate in addition to Plaquenil. She also has chronic kidney disease which is believed to be directly related to sarcoidosis. She had no improvement with methotrexate and plaquenil and her TLC and DLCO declined slightly so in January 2015 she was started on imuran but this had to be held because of increasing LFTs. She ended up seeing Dr. Lenise [name] at [facility] in 01/2014 for her sarcoid. He felt like her symptoms were more due to Asthma than Sarcoid. Plaquenil was restarted, prednisone tapered, and Advair changed to QVar.  HPI Chief Complaint  Patient presents with  . Acute Visit    Pt c/o low grade fever since Friday night, prod cough slightly worse.    [name] says that she is running a low grade fever at home, no aches or pains. No extra congestion. She says that she feels warm. She just feels wiped out. She is rarely coughing a little more. She says the fever started on Friday and has been as high as 99.3. She has not been eating or drinking much lately. No nausea, vomiting, diarrhea. No dysuria. She is coughing up yellow mucus which is "chunky".  Past Medical History  Diagnosis Date  . Sarcoid      Review of Systems  Constitutional: Positive for fatigue. Negative for fever and chills.  HENT: Negative for congestion, nosebleeds, postnasal drip, rhinorrhea and sinus pressure.  Respiratory: Positive  for cough and shortness of breath. Negative for wheezing.  Cardiovascular: Negative for chest pain, palpitations and leg swelling.       Objective:   Physical Exam Filed Vitals:   11/25/14 1547  BP: 156/92  Pulse: 141  Temp: 98.5 F (36.9 C)  TempSrc: Oral  Height: _0  (1.727 m)  Weight: 175 lb (79.379 kg)  SpO2: 93%  RA  Gen: fatigued appearing, no acute distress HEENT: NCAT, EOMi, OP clear without thrush PULM: Crackles in bases bilaterall CV: TAchycardic, no mgr, no JVD AB: BS+, soft, nontender Ext: warm, no edema, no clubbing, no cyanosis  Diagnosed 2009 [facility] with EBUS FNA of a hilar lymph node as well as an endobronchial lesion showing noncaseating granulomas, special stains were negative for fungal or AFB Prednisone off and on 2003-2014 11/2012 MTX/HCQ started [facility] Kidney involvement 03/12/2013 6 MW > 1500 feet, O2 saturation nadir 88% RA, HR max 132 03/2013 PFT > Ratio 66%, FEV 2.2L (82% pred), TLC 4.62 L (81% pred), DLCO 16.7 68% 09/2013 PFT > ratio 63%, FEV1 1.99> 2.08L with bronchodilator (76% predicted, 5%), TLC 4.25L (72% pred), DLCO 15.5 (64% pred) 10/2013 imuran started 09/2013 TPMT activity 19 > normal 10/2013 Stop MTX, start Imuran> goal dose is 141m/day (renal adjustment); plan start 555mdaily, increase 2530mvery 3 weeks; weekly CBC/CMET until 125m36my, then q-12 weeks;  11/2013 had to stop imuran due to LFT abnormalities on two tries 02/24/2014 CXR> improved bilateral airspace disease, upper lobe scarring noted 02/24/2014 PFT> Ratio FEV1 1.99L (75% pred, 12% change with BD),  TLC 4.04 L (71% pred), DLCO 16.9 (71% pred) 06/2014 Echo> LVEF 50-55%, normal global LVF funtion, mild MR, estimated PA systolic 82.9, mild TR, RV size normal 06/2014 PFT> ratio 55%, FEV1 1.39L (51% pred), FVC 2.52L (69%pred), TLCO 4.41L (75% pred), DLCO 64% pred 10/2014 PFT> Ratio 64% FEV1 1.52L (57% pred), FVC 2.39L (67% pred), TLC 3.58 (63% pred), DLCO 12.7 (53%  pred) 10/2014 6MW> 1465fet O2 83% RA     Assessment & Plan:   Community acquired pneumonia> given tachycardia, and chest x-ray findings consistent with community-acquired pneumonia I feel she needs to be admitted to hospital for observation and IV antibiotics. Plan[facility] -Ceftriaxone and azithromycin -Follow-up sputum culture -Blood cultures -Gentle IV fluids  Sinus tachycardia> due to community-acquired pneumonia Plan: -Telemetry monitoring -Gentle IV fluids  Asthma, not in exacerbation Plan: -Continue Advair -When necessary albuterol  Sarcoidosis> at this time it is not entirely clear to me if this disease has progressed, but as discussed previously I am concerned about her worsening pulmonary function testing. Of note, we did stop Plaquenil about a month ago. Plan: -high resolution CT chest -if stable 2/10 AM, start back prednisone

## 2014-11-25 NOTE — Progress Notes (Signed)
Subjective:    Patient ID: Stacie [name], female    DOB: 18-Jun-1956, 59 y.o.   MRN: [id number]   Synopsis: Stacie [name] first saw the [facility] pulmonary clinic in May 2014 for evaluation of sarcoidosis. She was diagnosed with sarcoid in 2009 at the [place] via an EBUS guided fine needle aspiration of a hilar lymph node as well as an endobronchial biopsy which showed noncaseating granulomas with negative special stains for infectious organisms. She was treated off and on with steroids from 2009 2 2014 and then in 2014 was started on methotrexate in addition to Plaquenil. She also has chronic kidney disease which is believed to be directly related to sarcoidosis.  She had no improvement with methotrexate and plaquenil and her TLC and DLCO declined slightly so in January 2015 she was started on imuran but this had to be held because of increasing LFTs. She ended up seeing Dr. Lenise Arena at [facility] in 01/2014 for her sarcoid.  He felt like her symptoms were more due to Asthma than Sarcoid.  Plaquenil was restarted, prednisone tapered, and Advair changed to QVar.  HPI Chief Complaint  Patient presents with  . Acute Visit    Pt c/o low grade fever since Friday night, prod cough slightly worse.     Junia says that she is running a low grade fever at home, no aches or pains.  No extra congestion.  She says that she feels warm.  She just feels wiped out.  She is rarely coughing a little more.  She says the fever started on Friday and has been as high as 99.3.  She has not been eating or drinking much lately.  No nausea, vomiting, diarrhea.  No dysuria.  She is coughing up yellow mucus which is "chunky".  Past Medical History  Diagnosis Date  . Sarcoid      Review of Systems  Constitutional: Positive for fatigue. Negative for fever and chills.  HENT: Negative for congestion, nosebleeds, postnasal drip, rhinorrhea and sinus pressure.   Respiratory:  Positive for cough and shortness of breath. Negative for wheezing.   Cardiovascular: Negative for chest pain, palpitations and leg swelling.       Objective:   Physical Exam Filed Vitals:   11/25/14 1547  BP: 156/92  Pulse: 141  Temp: 98.5 F (36.9 C)  TempSrc: Oral  Height: _0  (1.727 m)  Weight: 175 lb (79.379 kg)  SpO2: 93%  RA  Gen: fatigued appearing, no acute distress HEENT: NCAT, EOMi, OP clear without thrush PULM: Crackles in bases bilaterall CV: TAchycardic, no mgr, no JVD AB: BS+, soft, nontender Ext: warm, no edema, no clubbing, no cyanosis  Diagnosed 2009 [facility] with EBUS FNA of a hilar lymph node as well as an endobronchial lesion showing noncaseating granulomas, special stains were negative for fungal or AFB Prednisone off and on 2003-2014 11/2012 MTX/HCQ started [facility] Kidney involvement 03/12/2013 6 MW > 1500 feet, O2 saturation nadir 88% RA, HR max 132 03/2013 PFT > Ratio 66%, FEV 2.2L (82% pred), TLC 4.62 L (81% pred), DLCO 16.7 68% 09/2013 PFT > ratio 63%, FEV1 1.99> 2.08L with bronchodilator (76% predicted, 5%), TLC 4.25L (72% pred), DLCO 15.5 (64% pred) 10/2013 imuran started 09/2013 TPMT activity 19 > normal 10/2013 Stop MTX, start Imuran> goal dose is 174m/day (renal adjustment); plan start 588mdaily, increase 251mvery 3 weeks; weekly CBC/CMET until 125m1my, then q-12 weeks;  11/2013 had to stop imuran due to LFT abnormalities  on two tries 02/24/2014 CXR> improved bilateral airspace disease, upper lobe scarring noted 02/24/2014 PFT> Ratio FEV1 1.99L (75% pred, 12% change with BD), TLC 4.04 L (71% pred), DLCO 16.9 (71% pred) 06/2014 Echo> LVEF 50-55%, normal global LVF funtion, mild MR, estimated PA systolic 97.4, mild TR, RV size normal 06/2014 PFT> ratio 55%, FEV1 1.39L (51% pred), FVC 2.52L (69%pred), TLCO 4.41L (75% pred), DLCO 64% pred 10/2014 PFT> Ratio 64% FEV1 1.52L (57% pred), FVC 2.39L (67% pred), TLC 3.58 (63% pred), DLCO 12.7 (53% pred) 10/2014 6MW>  1431fet O2 83% RA     Assessment & Plan:   Community acquired pneumonia> given tachycardia, and chest x-ray findings consistent with community-acquired pneumonia I feel she needs to be admitted to hospital for observation and IV antibiotics. [facility] -Ceftriaxone and azithromycin -Follow-up sputum culture -Blood cultures -Gentle IV fluids  Sinus tachycardia> due to community-acquired pneumonia Plan: -Telemetry monitoring -Gentle IV fluids  Asthma, not in exacerbation Plan: -Continue Advair -When necessary albuterol  Sarcoidosis> at this time it is not entirely clear to me if this disease has progressed, but as discussed previously I am concerned about her worsening pulmonary function testing. Of note, we did stop Plaquenil about a month ago. Plan: -high resolution CT chest -if stable 2/10 AM, start back prednisone

## 2014-11-25 NOTE — Progress Notes (Signed)
Patient placed self on home CPAP. Setting is per home setting on machine. 2 liter oxygen bleed into mask. Patient is tolerating well

## 2014-11-25 NOTE — Telephone Encounter (Signed)
Bacterial culture

## 2014-11-26 ENCOUNTER — Inpatient Hospital Stay (HOSPITAL_COMMUNITY): Payer: BLUE CROSS/BLUE SHIELD

## 2014-11-26 DIAGNOSIS — J9601 Acute respiratory failure with hypoxia: Secondary | ICD-10-CM

## 2014-11-26 LAB — BASIC METABOLIC PANEL
Anion gap: 8 (ref 5–15)
BUN: 23 mg/dL (ref 6–23)
CO2: 32 mmol/L (ref 19–32)
Calcium: 11.9 mg/dL — ABNORMAL HIGH (ref 8.4–10.5)
Chloride: 102 mmol/L (ref 96–112)
Creatinine, Ser: 1.57 mg/dL — ABNORMAL HIGH (ref 0.50–1.10)
GFR calc Af Amer: 41 mL/min — ABNORMAL LOW (ref >90)
GFR calc non Af Amer: 35 mL/min — ABNORMAL LOW (ref >90)
Glucose, Bld: 103 mg/dL — ABNORMAL HIGH (ref 70–99)
Potassium: 4 mmol/L (ref 3.5–5.1)
Sodium: 142 mmol/L (ref 135–145)

## 2014-11-26 LAB — LEGIONELLA ANTIGEN, URINE

## 2014-11-26 LAB — CBC
HCT: 40.8 % (ref 36.0–46.0)
Hemoglobin: 13.9 g/dL (ref 12.0–15.0)
MCH: 28.1 pg (ref 26.0–34.0)
MCHC: 34.1 g/dL (ref 30.0–36.0)
MCV: 82.6 fL (ref 78.0–100.0)
Platelets: 254 10*3/uL (ref 150–400)
RBC: 4.94 MIL/uL (ref 3.87–5.11)
RDW: 12.9 % (ref 11.5–15.5)
WBC: 7.2 10*3/uL (ref 4.0–10.5)

## 2014-11-26 LAB — INFLUENZA PANEL BY PCR (TYPE A & B)
H1N1 flu by pcr: NOT DETECTED
Influenza A By PCR: NEGATIVE
Influenza B By PCR: NEGATIVE

## 2014-11-26 MED ORDER — HYDROCODONE-CHLORPHENIRAMINE 5-4 MG/5ML PO SOLN: 5.0000 mL | Freq: Two times a day (BID) | ORAL | Status: DC | PRN

## 2014-11-26 MED ORDER — AMOXICILLIN 875 MG PO TABS: 875.0000 mg | ORAL_TABLET | Freq: Two times a day (BID) | ORAL | Status: DC

## 2014-11-26 MED ORDER — PREDNISONE 10 MG PO TABS: ORAL_TABLET | ORAL | Status: DC

## 2014-11-26 NOTE — Discharge Summary (Signed)
Physician Discharge Summary       Patient ID: Stacie [name] MRN: [id number] DOB/AGE: 60-May-1957 60 y.o.  Admit date: 11/25/2014 Discharge date: 11/26/2014  Discharge Diagnoses:  CAP Sinus tachycardia  Asthma w/out exacerbation Sarcoidosis  Detailed Hospital Course:   Stacie [name] first saw the [facility] pulmonary clinic in May 2014 for evaluation of sarcoidosis. She was diagnosed with sarcoid in 2009 at the [place] via an EBUS guided fine needle aspiration of a hilar lymph node as well as an endobronchial biopsy which showed noncaseating granulomas with negative special stains for infectious organisms. She was treated off and on with steroids from 2009 2 2014 and then in 2014 was started on methotrexate in addition to Plaquenil. She also has chronic kidney disease which is believed to be directly related to sarcoidosis. She had no improvement with methotrexate and plaquenil and her TLC and DLCO declined slightly so in January 2015 she was started on imuran but this had to be held because of increasing LFTs. She ended up seeing Dr. Lenise Arena at [facility] in 01/2014 for her sarcoid. He felt like her symptoms were more due to Asthma than Sarcoid. Plaquenil was restarted, prednisone tapered, and Advair changed to QVar.  Presented to the office w/ cc: low grade fever, no aches or pains. Feeling wiped out. Did have some cough which was productive of yellow sputum. In office noted to have tachycardia w/ HR in 140s. She was admitted for further evaluation. A CT of chest was obtained that showed essentially acute on chronic changes which would be consistent w/ acute infection superimposed on her underlying sarcoidosis. She was treated w/ empiric antibiotics, IVFs and supplemental oxygen. Her tachycardia improved. We checked oxygen requirements and on resting room air her sats were 91%, the minute she started walking she dropped initially to 87%, this went as  low as 83% with further ambulation. We rested. Placed her on 2 liters and with this she came back up to 92%. We then ambulated w/ 2 liters and her sats maintained > 92%. She was deemed ready for d/c with the following plan as outlined below.     Discharge Plan by active problems  Community acquired pneumonia Plan: Complete 7 days of amoxicillin  F/u our office 3-4 weeks  Asthma, not in exacerbation Plan: -Continue Advair -When necessary albuterol  Sarcoidosis> at this time it is not entirely clear to me if this disease has progressed, but as discussed previously There is  concerned about her worsening pulmonary function testing. Of note, we did stop Plaquenil about a month ago. Plan: Resume prednisone w/ 4 week taper On f/u we will re-evaluate home regimen     Significant Hospital tests/ studies  Consults   Discharge Exam: BP 152/78 mmHg  Pulse 91  Temp(Src) 98.5 F (36.9 C) (Oral)  Resp 16  Ht _0  (1.727 m)  Wt 79.2 kg (174 lb 9.7 oz)  BMI 26.55 kg/m2  SpO2 94%   2 liters  Gen: fatigued appearing, no acute distress HEENT: NCAT, EOMi, OP clear without thrush PULM: Crackles more on left.  CV: TAchycardic, no mgr, no JVD AB: BS+, soft, nontender Ext: warm, no edema, no clubbing, no cyanosis  Labs at discharge Lab Results  Component Value Date   CREATININE 1.57* 11/26/2014   BUN 23 11/26/2014   NA 142 11/26/2014   K 4.0 11/26/2014   CL 102 11/26/2014   CO2 32 11/26/2014   Lab Results  Component Value  Date   WBC 7.2 11/26/2014   HGB 13.9 11/26/2014   HCT 40.8 11/26/2014   MCV 82.6 11/26/2014   PLT 254 11/26/2014   Lab Results  Component Value Date   ALT 21 11/25/2014   AST 28 11/25/2014   ALKPHOS 79 11/25/2014   BILITOT 0.7 11/25/2014   Lab Results  Component Value Date   INR 1.14 11/25/2014    Current radiology studies Dg Chest 2 View  11/25/2014   CLINICAL DATA:  Five days of cough, congestion, and fever, history of sarcoidosis and asthma   EXAM: CHEST  2 VIEW  COMPARISON:  PA and lateral chest x-ray of July 04, 2015  FINDINGS: The the lungs are well-expanded. The interstitial markings remain increased bilaterally. There are slightly more conspicuous today. There is no alveolar infiltrate. There is bulky hilar lymphadenopathy bilaterally that is more conspicuous today. The heart is not enlarged. The trachea is midline. There is no pleural effusion. There is multilevel degenerative disc space narrowing of the thoracic spine.  IMPRESSION: Chronically increased pulmonary interstitial markings that are slightly more conspicuous today. This may reflect superimposed acute bronchitis. Increased prominence of mediastinal and hilar lymphadenopathy may reflect progressive sarcoidosis.  Follow-up radiographs or chest CT scanning are recommended following anticipated antibiotic therapy.   Electronically Signed   By: David  Martinique   On: 11/25/2014 15:53   Ct Chest High Resolution  11/26/2014   CLINICAL DATA:  Low-grade fever and fatigue for 5 days. Worsening pulmonary function tests. History of sarcoid with various steroid treatments and methotrexate therapy. Community acquired pneumonia.  EXAM: CT CHEST WITHOUT CONTRAST  TECHNIQUE: Multidetector CT imaging of the chest was performed following the standard protocol without intravenous contrast. High resolution imaging of the lungs, as well as inspiratory and expiratory imaging, was performed.  COMPARISON:  Chest radiographs 11/25/2014, 07/03/2014, 10/02/2013 and 03/29/2013.  FINDINGS: Mediastinum/Nodes: Calcified nodule in the left lobe of the thyroid incompletely imaged. Mediastinal lymph nodes measure up to 12 mm in the low right paratracheal station. Hilar regions are difficult to definitively evaluate without IV contrast but adenopathy is suspected bilaterally. No axillary adenopathy. Pulmonary arteries are enlarged. Heart size normal. No pericardial effusion. Tiny hiatal hernia.  Lungs/Pleura: Upper  lobe and perihilar predominant pattern of parenchymal retraction, traction bronchiectasis, ground-glass and architectural distortion. Diffuse pattern of ill-defined peribronchovascular nodularity is seen without a zonal predominance. 4 mm nodule in the left lower lobe (series 8, image 42). No air trapping. No pleural fluid. Airway is unremarkable.  Upper abdomen: 5 mm low-attenuation lesion in the dome of the liver is too small to characterize. Visualized portions of the liver, gallbladder, adrenal glands, kidneys, spleen, pancreas and stomach are otherwise grossly unremarkable.  Musculoskeletal: No worrisome lytic or sclerotic lesions. Degenerative changes are seen in the spine.  IMPRESSION: 1. Diffuse ill-defined peribronchovascular nodularity suggests an infectious bronchiolitis superimposed on chronic fibrotic changes of sarcoid. 2. 4 mm left lower lobe nodule. If the patient is at high risk for bronchogenic carcinoma, follow-up chest CT at 1 year is recommended. If the patient is at low risk, no follow-up is needed. This recommendation follows the consensus statement: Guidelines for Management of Small Pulmonary Nodules Detected on CT Scans: A Statement from the Fleischner Society as published in Radiology 2005; 237:395-400. 3. Enlarged pulmonary arteries, indicative of pulmonary arterial hypertension.   Electronically Signed   By: Lorin Picket M.D.   On: 11/26/2014 08:29   Portable Chest 1 View  11/26/2014   CLINICAL DATA:  Pneumonia.  Sarcoidosis.  EXAM: PORTABLE CHEST - 1 VIEW  COMPARISON:  Chest x-ray 11/25/2014.  FINDINGS: Mediastinum is stable. Stable bilateral hilar fullness noted. Hilar adenopathy cannot be excluded. Heart size is stable. Progressive bilateral pulmonary infiltrates are present particularly upper lobes. This suggest active pneumonitis. Active granulomatous disease cannot be excluded. Underlying chronic interstitial disease is present. No pleural effusion or pneumothorax.   IMPRESSION: 1. Progressive bilateral pulmonary interstitial infiltrates particularly upper lobe. This suggest active pneumonitis. Active granulomatous disease cannot be excluded. Chronic underlying interstitial lung disease is present. 2. Possible bilateral hilar adenopathy. This may reflect the patient's known sarcoidosis .   Electronically Signed   By: Marcello Moores  Register   On: 11/26/2014 07:39    Disposition:  Final discharge disposition not confirmed      Discharge Instructions    Diet - low sodium heart healthy    Complete by:  As directed      Discharge instructions    Complete by:  As directed   Use oxygen with any activity     For home use only DME oxygen    Complete by:  As directed   Mode or (Route):  Nasal cannula  Liters per Minute:  2  Frequency:  Continuous (stationary and portable oxygen unit needed)  Oxygen delivery system:  Gas     Increase activity slowly    Complete by:  As directed             Medication List    TAKE these medications        ADVAIR HFA 115-21 MCG/ACT inhaler  Generic drug:  fluticasone-salmeterol  Inhale 2 puffs into the lungs 2 (two) times daily.     amoxicillin 875 MG tablet  Commonly known as:  AMOXIL  Take 1 tablet (875 mg total) by mouth 2 (two) times daily.     aspirin 81 MG tablet  Take 81 mg by mouth daily.     GLUCOSAMINE PO  Take 1 tablet by mouth daily.     Hydrocodone-Chlorpheniramine 5-4 MG/5ML Soln  Take 5 mLs by mouth 2 (two) times daily as needed (cough).     levothyroxine 137 MCG tablet  Commonly known as:  SYNTHROID, LEVOTHROID  Take 137 mcg by mouth daily before breakfast.     mometasone 50 MCG/ACT nasal spray  Commonly known as:  NASONEX  Place 1 spray into the nose 2 (two) times daily.     multivitamin capsule  Take 1 capsule by mouth 2 (two) times daily.     omeprazole 40 MG capsule  Commonly known as:  PRILOSEC  Take 40 mg by mouth daily.     PARoxetine 10 MG tablet  Commonly known as:  PAXIL    Take 1 tablet (10 mg total) by mouth at bedtime.     predniSONE 10 MG tablet  Commonly known as:  DELTASONE  4 tabs daily for 7 days, then 3 tabs daily x 7d, then 2 tabs daily x 7d, then 1 tab daily x7d     ranitidine 150 MG tablet  Commonly known as:  ZANTAC  Take 150 mg by mouth at bedtime.     VENTOLIN HFA 108 (90 BASE) MCG/ACT inhaler  Generic drug:  albuterol  Inhale 2 puffs into the lungs every 6 (six) hours as needed for wheezing.       Follow-up Information    Follow up with Simonne Maffucci, MD On 12/25/2014.   Specialty:  Pulmonary Disease   Why:  345 pm.  Contact information:   [place] [address] [phone]       Discharged Condition: fair  Physician Statement:   The Patient was personally examined, the discharge assessment and plan has been personally reviewed and I agree with ACNP Babcock's assessment and plan. > 30 minutes of time have been dedicated to discharge assessment, planning and discharge instructions.   Signed: BABCOCK,PETE 11/26/2014, 1:18 PM   Attending:  I have seen and examined the patient with nurse practitioner/resident and agree with the note above.   Roselie Awkward, MD [facility] Pager: [phone] Cell: [phone] If no response, call [phone]

## 2014-11-26 NOTE — Progress Notes (Signed)
Message left for [employer] to come and check pt's home CPAP machine.

## 2014-11-26 NOTE — Progress Notes (Signed)
SATURATION QUALIFICATIONS: (This note is used to comply with regulatory documentation for home oxygen)  Patient Saturations on Room Air at Rest = 91%  Patient Saturations on Room Air while Ambulating = 83%  Patient Saturations on 2 Liters of oxygen while Ambulating = 92%  Please briefly explain why patient needs home oxygen: Needs oxygen to maintain saturations >92%.  Copied information from NP progress note today. Witnessed NP ambulating pt himself and checking pt's O2 saturations.   Othella Boyer [facility]

## 2014-11-27 ENCOUNTER — Telehealth: Payer: Self-pay | Admitting: Pulmonary Disease

## 2014-11-27 NOTE — Telephone Encounter (Signed)
Message sent to [facility]. Will await response. Pt aware

## 2014-11-27 NOTE — Telephone Encounter (Signed)
Per Lenna Sciara they are going to call pt for set up Called pt and made aware

## 2014-11-28 LAB — RESPIRATORY CULTURE OR RESPIRATORY AND SPUTUM CULTURE: Organism ID, Bacteria: NORMAL

## 2014-12-01 ENCOUNTER — Telehealth: Payer: Self-pay | Admitting: Pulmonary Disease

## 2014-12-01 NOTE — Telephone Encounter (Signed)
Called and spoke to pt. Pt is requesting a letter stating pt was admitted into the hospital and pt's husband missed his flight last minute d/t his wife (the pt) being admitted. The letter will need to include: the dates of admission, the necessity of the admission, and that it was a medical emergency that pt's husband missed flight allowing refund of ticket.   BQ please advise.

## 2014-12-02 ENCOUNTER — Encounter: Payer: Self-pay | Admitting: Emergency Medicine

## 2014-12-02 NOTE — Telephone Encounter (Signed)
Called and spoke to pt. Informed her of the letter. Pt requested letter to be mailed to home address. Address verified. Letter placed in outgoing mail. Pt verbalized understanding and denied any further questions or concerns at this time.

## 2014-12-02 NOTE — Telephone Encounter (Signed)
Yes, absolutely  She was admitted last week, I don't remember the dates but it was only for 24 hours.   Reason for admission was acute on chronic hypoxemic respiratory failure due to community acquired pneumonia.  Thanks

## 2014-12-09 ENCOUNTER — Telehealth: Payer: Self-pay | Admitting: Pulmonary Disease

## 2014-12-09 DIAGNOSIS — D860 Sarcoidosis of lung: Secondary | ICD-10-CM

## 2014-12-09 NOTE — Telephone Encounter (Signed)
Spoke with the pt  She states that she is needing o2 portable o2 picked up  She is only using o2 with CPAP at night  She states that only used o2 with exertion the day after she was d/c'ed, but none since then  Please advise if okay to send order to pick up  Thanks!

## 2014-12-10 NOTE — Telephone Encounter (Signed)
OK by me

## 2014-12-10 NOTE — Telephone Encounter (Signed)
Order placed to d/c all 02 except 02 with cpap.  Pt aware.  Nothing further needed.

## 2014-12-16 ENCOUNTER — Encounter
Admit: 2014-12-16 | Disposition: A | Discharge status: Home / Self Care | Payer: Self-pay | Attending: Pulmonary Disease | Admitting: Pulmonary Disease

## 2014-12-17 ENCOUNTER — Telehealth: Payer: Self-pay | Admitting: Pulmonary Disease

## 2014-12-17 NOTE — Telephone Encounter (Signed)
Spoke with pt. Had CT high resolution on 11/25/14 while in the hospital. Has appointment scheduled for 12/31/14 for another CT.  BQ - do you want pt to keep this appointment or can she cancel?

## 2014-12-17 NOTE — Telephone Encounter (Signed)
Pt aware. She needs this to be cancelled at [facility]. Called and they were closed. Will call [place] in AM

## 2014-12-17 NOTE — Telephone Encounter (Signed)
Does not need another CT

## 2014-12-18 NOTE — Telephone Encounter (Signed)
Spoke with [facility] and ct cancelled.

## 2014-12-25 ENCOUNTER — Ambulatory Visit: Payer: BLUE CROSS/BLUE SHIELD | Admitting: Pulmonary Disease

## 2015-01-07 ENCOUNTER — Ambulatory Visit (INDEPENDENT_AMBULATORY_CARE_PROVIDER_SITE_OTHER): Payer: BLUE CROSS/BLUE SHIELD | Admitting: Pulmonary Disease

## 2015-01-07 ENCOUNTER — Encounter: Payer: Self-pay | Admitting: Pulmonary Disease

## 2015-01-07 VITALS — BP 136/74 | HR 125 | Ht 68.0 in | Wt 186.0 lb

## 2015-01-07 DIAGNOSIS — J189 Pneumonia, unspecified organism: Secondary | ICD-10-CM

## 2015-01-07 DIAGNOSIS — J301 Allergic rhinitis due to pollen: Secondary | ICD-10-CM

## 2015-01-07 DIAGNOSIS — J4540 Moderate persistent asthma, uncomplicated: Secondary | ICD-10-CM | POA: Diagnosis not present

## 2015-01-07 DIAGNOSIS — J309 Allergic rhinitis, unspecified: Secondary | ICD-10-CM | POA: Insufficient documentation

## 2015-01-07 DIAGNOSIS — D860 Sarcoidosis of lung: Secondary | ICD-10-CM

## 2015-01-07 MED ORDER — PREDNISONE 5 MG PO TABS: ORAL_TABLET | ORAL | Status: AC

## 2015-01-07 MED ORDER — FLUTICASONE-SALMETEROL 230-21 MCG/ACT IN AERO: 2.0000 | INHALATION_SPRAY | Freq: Two times a day (BID) | RESPIRATORY_TRACT | Status: DC

## 2015-01-07 NOTE — Assessment & Plan Note (Signed)
This has resolved clinically.

## 2015-01-07 NOTE — Assessment & Plan Note (Signed)
I continue to struggle to know whether or not Stacie [name]'s airflow obstruction and her symptoms are related to biopsy proven sarcoid or asthma. What is certain is that she does very well on prednisone she never really had significant benefit from methotrexate she was intolerant to Imuran.  Today she looks great while taking prednisone. She's currently taking 5 mg daily.  I reviewed the images from her CT chest today in clinic which showed atypical findings for sarcoid and community-acquired pneumonia. However, she responded to treatment for both.   Plan: -Continue penicillin 5 mg daily for a month then 5 mg every other day for a month -Repeat CT chest in May to see if the findings from February 2016 have improved -If symptoms are persistent or the findings from the CT chest are persistent then we may start CellCept in May

## 2015-01-07 NOTE — Patient Instructions (Addendum)
Take the prednisone 70m daily for a month, then 541mevery other day for a month Take the zyrtec daily (generic OK), if that doesn't work I can call in singulair CT chest in May to f/u your sarcoid We will see you back in May

## 2015-01-07 NOTE — Assessment & Plan Note (Signed)
This has become more of a problem for her in the last few weeks. She continues to take the Nasonex appropriately.  Plan: -Continue Nasonex -Start Zyrtec -If no improvement with Zyrtec he can prescribe Singulair

## 2015-01-07 NOTE — Assessment & Plan Note (Signed)
She was wheezing again today in clinic but overall she looks much better than the last time I saw her. She is not taking her Advair appropriately.  Plan: -Use high doses, each a Advair 2 puffs twice a day

## 2015-01-07 NOTE — Progress Notes (Signed)
Subjective:    Patient ID: Stacie [name], female    DOB: 09/04/1956, 59 y.o.   MRN: [id number]   Synopsis: Stacie [name] first saw the [facility] pulmonary clinic in May 2014 for evaluation of sarcoidosis. She was diagnosed with sarcoid in 2009 at the [place] via an EBUS guided fine needle aspiration of a hilar lymph node as well as an endobronchial biopsy which showed noncaseating granulomas with negative special stains for infectious organisms. She was treated off and on with steroids from 2009 2 2014 and then in 2014 was started on methotrexate in addition to Plaquenil. She also has chronic kidney disease which is believed to be directly related to sarcoidosis.  She had no improvement with methotrexate and plaquenil and her TLC and DLCO declined slightly so in January 2015 she was started on imuran but this had to be held because of increasing LFTs. She ended up seeing Dr. Lenise Arena at [facility] in 01/2014 for her sarcoid.  He felt like her symptoms were more due to Asthma than Sarcoid.  Plaquenil was restarted, prednisone tapered, and Advair changed to QVar.  HPI Chief Complaint  Patient presents with  . Hospitalization Follow-up    Pt hospitalized for pna after seeing BQ in clinic on 2/9.  pt has no new breathing complaints at this time.  states outdoor pollen aggrivates her cough and sinus congestion. Needs Advair refill.    [name] has been feeling well since the hospitalization. She says that on prednisone she feels incredible.  She has been walking on the treadmill more. She has had more sinus sympotms and itchying eyes lately.  Her energy level is good right now.  She is down to 19m daily on the prednisone.  Overall she says her energy level is great and she has been working out on a regular basis. Her breathing has been doing well. She continues to take Nasacort every day but she is not taking an anti- histamine. She is not having fevers or chills.  She has a cough right now. She is about to go to the beach.  Past Medical History  Diagnosis Date  . Sarcoid      Review of Systems  Constitutional: Negative for fever, chills and fatigue.  HENT: Positive for postnasal drip, rhinorrhea and sinus pressure. Negative for congestion and nosebleeds.   Respiratory: Positive for shortness of breath. Negative for cough, wheezing and stridor.   Cardiovascular: Negative for chest pain, palpitations and leg swelling.       Objective:   Physical Exam Filed Vitals:   01/07/15 1129  BP: 136/74  Pulse: 125  Height: _0  (1.727 m)  Weight: 186 lb (84.369 kg)  SpO2: 97%  RA  Gen: Well appearing, no acute distress HEENT: NCAT, EOMi, OP clear without thrush PULM: Few wheezes bilaterally, otherwise clear to auscultation CV: Regular rate and rhythm, no mgr, no JVD AB: BS+, soft, nontender Ext: warm, no edema, no clubbing, no cyanosis  Diagnosed 2009 [facility] with EBUS FNA of a hilar lymph node as well as an endobronchial lesion showing noncaseating granulomas, special stains were negative for fungal or AFB Prednisone off and on 2003-2014 [facility]2013 6 MW > 1500 feet, O2 saturation nadir 88% RA, HR max 132 03/2013 PFT > Ratio 66%, FEV 2.2L (82% pred), TLC 4.62 L (81% pred), DLCO 16.7 68% 09/2013 PFT > ratio 63%, FEV1 1.99> 2.08L with bronchodilator (76% predicted, 5%), TLC 4.25L (72% pred),  DLCO 15.5 (64% pred) 10/2013 imuran started 09/2013 TPMT activity 19 > normal 10/2013 Stop MTX, start Imuran> goal dose is 131m/day (renal adjustment); plan start 551mdaily, increase 253mvery 3 weeks; weekly CBC/CMET until 125m76my, then q-12 weeks;  11/2013 had to stop imuran due to LFT abnormalities on two tries 02/24/2014 CXR> improved bilateral airspace disease, upper lobe scarring noted 02/24/2014 PFT> Ratio FEV1 1.99L (75% pred, 12% change with BD), TLC 4.04 L (71% pred), DLCO 16.9 (71% pred) 06/2014 Echo> LVEF  50-55%, normal global LVF funtion, mild MR, estimated PA systolic 33.801.7ld TR, RV size normal 06/2014 PFT> ratio 55%, FEV1 1.39L (51% pred), FVC 2.52L (69%pred), TLCO 4.41L (75% pred), DLCO 64% pred 10/2014 PFT> Ratio 64% FEV1 1.52L (57% pred), FVC 2.39L (67% pred), TLC 3.58 (63% pred), DLCO 12.7 (53% pred) 10/2014 6MW> [id number] 83% RA     Assessment & Plan:   Pulmonary sarcoidosis I continue to struggle to know whether or not Shaquille's airflow obstruction and her symptoms are related to biopsy proven sarcoid or asthma. What is certain is that she does very well on prednisone she never really had significant benefit from methotrexate she was intolerant to Imuran.  Today she looks great while taking prednisone. She's currently taking 5 mg daily.  I reviewed the images from her CT chest today in clinic which showed atypical findings for sarcoid and community-acquired pneumonia. However, she responded to treatment for both.   Plan: -Continue penicillin 5 mg daily for a month then 5 mg every other day for a month -Repeat CT chest in May to see if the findings from February 2016 have improved -If symptoms are persistent or the findings from the CT chest are persistent then we may start CellCept in May   CAP (community acquired pneumonia) This has resolved clinically.   Asthma, chronic She was wheezing again today in clinic but overall she looks much better than the last time I saw her. She is not taking her Advair appropriately.  Plan: -Use high doses, each a Advair 2 puffs twice a day   Allergic rhinitis This has become more of a problem for her in the last few weeks. She continues to take the Nasonex appropriately.  Plan: -Continue Nasonex -Start Zyrtec -If no improvement with Zyrtec he can prescribe Singulair     Updated Medication List Outpatient Encounter Prescriptions as of 01/07/2015  Medication Sig  . albuterol (VENTOLIN HFA) 108 (90 BASE) MCG/ACT inhaler Inhale 2  puffs into the lungs every 6 (six) hours as needed for wheezing.  . aspMarland Kitchenrin 81 MG tablet Take 81 mg by mouth daily.  . GLUMarland KitchenOSAMINE PO Take 1 tablet by mouth daily.  . Hydrocodone-Chlorpheniramine 5-4 MG/5ML SOLN Take 5 mLs by mouth 2 (two) times daily as needed (cough).  . levMarland Kitchenthyroxine (SYNTHROID, LEVOTHROID) 137 MCG tablet Take 137 mcg by mouth daily before breakfast.  . mometasone (NASONEX) 50 MCG/ACT nasal spray Place 1 spray into the nose 2 (two) times daily.  . Multiple Vitamin (MULTIVITAMIN) capsule Take 1 capsule by mouth 2 (two) times daily.   . omeMarland Kitchenrazole (PRILOSEC) 40 MG capsule Take 40 mg by mouth daily.  . PARMarland Kitchenxetine (PAXIL) 10 MG tablet Take 1 tablet (10 mg total) by mouth at bedtime.  . predniSONE (DELTASONE) 5 MG tablet 4 tabs daily for 7 days, then 3 tabs daily x 7d, then 2 tabs daily x 7d, then 1 tab daily x7d  . ranitidine (ZANTAC) 150 MG tablet Take 150 mg by mouth at  bedtime.  . [DISCONTINUED] ADVAIR HFA 141-03 MCG/ACT inhaler Inhale 2 puffs into the lungs 2 (two) times daily.  . [DISCONTINUED] predniSONE (DELTASONE) 10 MG tablet 4 tabs daily for 7 days, then 3 tabs daily x 7d, then 2 tabs daily x 7d, then 1 tab daily x7d  . fluticasone-salmeterol (ADVAIR HFA) 230-21 MCG/ACT inhaler Inhale 2 puffs into the lungs 2 (two) times daily.  . [DISCONTINUED] amoxicillin (AMOXIL) 875 MG tablet Take 1 tablet (875 mg total) by mouth 2 (two) times daily.

## 2015-01-16 ENCOUNTER — Encounter
Admit: 2015-01-16 | Disposition: A | Discharge status: Home / Self Care | Payer: Self-pay | Attending: Pulmonary Disease | Admitting: Pulmonary Disease

## 2015-01-30 ENCOUNTER — Encounter: Payer: Self-pay | Admitting: Pulmonary Disease

## 2015-02-02 ENCOUNTER — Telehealth: Payer: Self-pay | Admitting: Pulmonary Disease

## 2015-02-02 MED ORDER — PAROXETINE HCL 10 MG PO TABS: 10.0000 mg | ORAL_TABLET | Freq: Every day | ORAL | Status: DC

## 2015-02-02 NOTE — Telephone Encounter (Signed)
Rx has been sent in. Pt is aware. Nothing further was needed. 

## 2015-02-04 ENCOUNTER — Encounter: Payer: Self-pay | Admitting: Pulmonary Disease

## 2015-02-06 NOTE — Consult Note (Signed)
PATIENT NAME:  Stacie [name], CORRIGAN MR#:  [id number] DATE OF BIRTH:  Apr 14, 1956  DATE OF CONSULTATION:  12/26/2012  REFERRING PHYSICIAN:   CONSULTING PHYSICIAN:  Manya Silvas, MD  HISTORY OF PRESENT ILLNESS:  The patient is a 59 year old white female who was diagnosed with C. difficile colitis infection about 7 to 10 days ago. She was doing well on vancomycin. She and her husband went to the [place] and she also has been around a couple of grandkids who had diarrhea for a couple of weeks and she started having nausea, weakness, fatigue and diarrhea 2 days ago. She saw [place] in the office yesterday and had some postural changes to blood pressure and was given 2 bags of IV fluids. Her stool was checked. She is negative for Campylobacter. She has a comprehensive culture pending. Despite being on vancomycin, she had a positive C. difficile study by PCR.   The patient says that she had been taking her vancomycin up until 3 days ago when she started having nausea and vomiting and she has not been taking it much since then.   PAST MEDICAL HISTORY:   1.  The patient has a history of left-sided sigmoid resection for diverticulitis not responding to antibiotics.  2.  The patient has a sarcoid-like pulmonary disease followed at [facility] and was treated with prednisone for a year. She now is on hydroxychloroquine and methotrexate for this.  3.  Hypertension.  4.  ADD.  5.  Elevated lipids.  6.  Hypothyroidism.  7.  Uterine fibroids.   PAST SURGICAL HISTORY:   1.  Hysterectomy without bilateral salpingo-oophorectomy.  2.  Bilateral tubal ligation.  3.  Rhinoplasty.  4.  Thyroid surgery.  5.  Laparoscopic adhesiolysis with Dr. Enzo Bi.  6.  Sigmoid resection by Dr. Pat Patrick a couple of years ago for diverticulitis not responding to treatment.   CURRENT MEDICATIONS:  Aspirin 81 mg a day, Synthroid 137 mcg a day, glucosamine 1 or 2 capsules a day, multivitamins 1 a day, Nasonex 50 mcg a day 2 sprays in both  nostrils once a day, Advair 250/50 mcg 1 puff twice a day, Ventolin HFA 90 mcg per actuation 2 puffs every 4 hours as needed, methotrexate 2.5 mg 6 tablets by mouth once a week, potassium citrate 10 mEq 1 tablet by mouth 3 times a day, hydroxychloroquine 200 mg twice a day orally, vancomycin 250 mg 4 times a day, Zofran 4 mg q. 6 hours p.r.n. for nausea, Phenergan 25 mg q. 6 hours p.r.n. for nausea.   ALLERGIES:  SULFA.   PHYSICAL EXAMINATION:  GENERAL: White female who looks weak and washed out.  HEENT: Sclerae anicteric. Conjunctivae negative. Tongue negative.  CHEST: Clear.  HEART: No murmurs or gallops that I could hear.  ABDOMEN: Mild tenderness across the upper abdomen.  SKIN: Warm and dry. No arthritic complaints.  RECTAL: Exam not done at this time.   LABORATORY DATA:  White count 4000, hemoglobin 13.5, hematocrit 39.3, platelet count 222. Stool showed no Campylobacter antigen detected.   ASSESSMENT:   1.  Viral illness with some vomiting today. She vomited twice today. Because of her vomiting and her diarrhea which she says is happening about every 30 minutes to an hour, she could not remain as an outpatient and will have to be admitted. She clearly will need more than 2 nights' hospitalization.  2.  Recommend a change from vancomycin to Dificid because of possible failure of vancomycin.  3.  Plan to do  a flexible sigmoidoscopy tomorrow to get a brief look at the mucosa and get a little further idea of the origin of her diarrhea. We will also check stool for white cells. I will follow with you.    ____________________________ Manya Silvas, MD rte:si D: 12/26/2012 17:20:49 ET T: 12/26/2012 18:14:52 ET JOB#: [id number]  cc: Manya Silvas, MD, <Dictator> Huey Romans, MD Ceasar Lund Anselm Jungling, MD  Manya Silvas MD ELECTRONICALLY SIGNED 01/09/2013 14:57

## 2015-02-06 NOTE — Consult Note (Signed)
Pt without diarrhea or vomiting today.  She lost her iv and the replacement is in [facility] fossa and the machine beeps a lot.  Since she has no vomiting or diarrhea can be discharged.  Eosinophils in stool of uncertain cause.  Will follow up next week.  Electronic Signatures: Manya Silvas (MD)  (Signed on 14-Mar-14 16:39)  Authored  Last Updated: 14-Mar-14 16:39 by Manya Silvas (MD)

## 2015-02-06 NOTE — Consult Note (Signed)
Pt had flex to rectosig area to rule out colitis as cause for her diarrhea, no colitis in rectum or rectosigmoid area. Continue current meds.  Electronic Signatures: Manya Silvas (MD)  (Signed on 13-Mar-14 13:44)  Authored  Last Updated: 13-Mar-14 13:44 by Manya Silvas (MD)

## 2015-02-06 NOTE — Discharge Summary (Signed)
PATIENT NAME:  Stacie [name], Stacie [name] MR#:  [id number] DATE OF BIRTH:  [birth year]  DATE OF ADMISSION:  12/26/2012 DATE OF DISCHARGE:  12/28/2012  ADMITTING DIAGNOSIS:  Diarrhea.  DISCHARGE DIAGNOSES: 1.  Diarrhea, possibly due to viral gastroenteritis. Recent diagnosis of Clostridium difficile treated with vancomycin and 2 days of Dificid was also given status post sigmoidoscopy without any evidence of acute colitis. There was no evidence of colitis noted. 2.  Elevated eosinophils in her stool of unclear significance possibly due to Clostridium difficile per Dr. Mechele Collin.  3.  Hypokalemia due to diarrhea.  4.  History of left-sided sigmoid resection for diverticulitis.  5. Sarcoid lipoma disease followed at [facility] and has been treated with prednisone for a year, currently on hydrochoraquine and methotrexate.  6.  Hypertension.  7.  Hyperlipidemia.  8.  Hypothyroidism.  9.  Uterine fibroids.  10.  Status post hysterectomy without bilateral salpingo-oophorectomy and bilateral tubal ligation.  11.  Status post rhinoplasty.  12.  Status post thyroid surgery.  13.  Status post laparoscopic adhesion.  14.  Sigmoid resection in the past by Dr. Michela Pitcher a few years prior.   CONSULTANT:  Dr. Mechele Collin  PERTINENT LABORATORY EVALUATIONS:  Admitting glucose 94, BUN 15, creatinine 1.21, sodium 134, potassium 3.5, chloride 102, CO2 is 23, calcium 8.6, total protein 6.9, albumin 3.0, bili total 0.6, alk phos was 80, AST 25, ALT 20. WBC 4. ESR was 46, hemoglobin 13.5, platelet count 222. Stool for C. diff x 2 negative; first one done on March 12 at 2156, second one done March 13, stool for diff is negative.  WBC in the stool with greater than 30. WBCs 80% eosinophils, stool cultures no growth. Sigmoidoscopy shows no inflammation or any evidence of colitis.   HOSPITAL COURSE: The patient is a 59 year old white female who was recently diagnosed with C. diff and was treated with 10 days of vancomycin. Her diarrhea had  improved and then re-occurred. She was seen in Dr. Earnest Conroy office and prior to being hospitalized, apparently she gave a stool sample which was taken to the lab and the lab reported that patient had positive C. diff. However, when reviewing the records, there is no evidence of that C. diff being positive. However, it was felt that she had recurrence of her C. diff and we were asked to admit the patient. She was admitted and placed on Dificid and was given IV fluids. She had low potassium, which was replaced. Dr. Mechele Collin went ahead and did a sigmoidoscopy which was completely normal, so it is unlikely that she had recurrence of her C. diff colitis. She was around her grandkids recently and they were sick with a diarrhea-like infection so certainly this could have been gastroenteritis. The patient also was noted to have eosinophils in her stool of unclear etiology. She will follow up with Dr. Mechele Collin next week. Her diarrhea is mostly resolved. Her vomiting is also resolved. She will see Dr. Mechele Collin next week if continues to have problem, then she will call us during the weekend. At this time, she is stable for discharge.   DISCHARGE MEDICATIONS:  Aspirin 81 mg 1 tab p.o. daily, multivitamins 1 tab p.o. b.i.d., Synthroid 137 mcg daily, Advair 150/21 mcg 1 inhalation b.i.d., Nasonex 50 mcg 1 spray b.i.d., hydrochloroquine 200 one tab p.o. q. 12, methotrexate 15 mg weekly, Proventil 2 puffs 4 times per day as needed, Klor-Con 10 mEq 1 tab p.o. t.i.d.   DIET:  Low residual diet.   ACTIVITY:  As tolerated.   Follow with Dr. Mechele Collin next week. The patient to resume all meds as previously taking. She is also to take probiotics supplements as before. Notify Dr. Mechele Collin if diarrhea worsens.  Note:  32 minutes spent on the discharge.      ____________________________ Lacie Scotts. Allena Katz, MD shp:ce D: 12/29/2012 08:27:29 ET T: 12/29/2012 18:03:24 ET JOB#: [id number]  cc: Ashtan Laton H. Allena Katz, MD,  <Dictator> Charise Carwin MD ELECTRONICALLY SIGNED 01/04/2013 11:22

## 2015-02-06 NOTE — H&P (Signed)
PATIENT NAME:  Stacie [name], WHINERY MR#:  [id number] DATE OF BIRTH:  04/21/1956  DATE OF ADMISSION:  12/26/2012  PRIMARY GASTROENTEROLOGIST: Antonietta Breach, MD   PRESENTING COMPLAINT:  Diarrhea.    HISTORY OF PRESENT ILLNESS: This is a 59 year old white female diagnosed with C. difficile colitis infection about 10 days and she was doing well on vancomycin. She was also around some her sick grandkids for a couple of days and she started having nausea, weakness, fatigue and diarrhea 2 days ago. She was seen in the office of gastroenterologist yesterday and had some postural changes in blood pressure, was given two bags of IV fluid. Her stool was checked which was negative for Campylobacter. She had a comprehensive culture pending at the doctor's office and she had a positive C. difficile study by PCR in the office, despite being on vancomycin for 7 to 8 days. She was taking her vancomycin regularly up until 3 days ago when she started having nausea and vomiting and she was not able to take it. On further questioning, this is the third episode of C. difficile in the last 1-1/2 years for her. Initially, she had episodes, and colon partial resection was done for that. She also had complications after surgery and has to have revision surgery  and after the last surgery in May 2000,  she did not have any episodes. This for the first one what she is going through right now. Due to a severe vomiting and nausea, and dehydration, she was not able to eat or drink enough and that is why she went to Dr. Edd Fabian office again to and he referred her to come to the hospital and getting admitted for further work-up.   REVIEW OF SYSTEMS:  GENERAL:  Positive for fever, fatigue, generalized weakness. No weight loss or weight gain.  EYES: No blurring or double vision or redness.  EARS, NOSE, THROAT: No tinnitus, ear pain or hearing loss.  RESPIRATORY: No cough, wheezing, chest tightness.  CARDIOVASCULAR: No chest pain,  orthopnea, edema, arrhythmia, palpitations.  GASTROINTESTINAL: Had nausea, vomiting, diarrhea and mild abdominal pain. For the last almost 10 days, diarrhea there and now the last 2 to 3 days nausea, vomiting is also there.  GENITOURINARY: No dysuria, hematuria or increased frequency of the urination.  ENDOCRINOLOGY: No heat or cold intolerance.  MUSCULOSKELETAL: No pain or swelling in any joints.  NEUROLOGICAL: No numbness, weakness, vertigo or tremors.   PSYCHIATRIC: No anxiety, insomnia or depression.   PAST MEDICAL HISTORY:  1.  History of left-sided sigmoid resection for diverticulitis, not responding to antibiotics.  2.  Sarcoid-like pulmonary disease followed at [facility] and was treated with prednisone for a year and currently on hydroxychloroquine and methotrexate.  3.  Hypertension.  4.  Hyperlipidemia.  5.  Hypothyroidism.  6.  Uterine fibroid.   PAST SURGICAL HISTORY:  1.  Hysterectomy without bilateral salpingo-oophorectomy, bilateral tubal ligation.  2.  Rhinoplasty.  3.  Thyroid surgery.  4.  Laparoscopic  adhesiolysis with Dr. Enzo Bi.  5. Sigmoid resection by Dr. Pat Patrick couple of years ago for diverticulitis, not responding to treatment.   CURRENT MEDICATIONS: 1.  Aspirin 81 mg per day.  2.  Synthroid 137 mcg per day.  3.  Glucosamine 1 to 2 care she is a day.  4.  Multi-Vitamin 1 tablet per day.  5.  Nasonex 50 mcg a day.  6.  Advair 1 puff twice a day.  7.  Ventolin on 2 puffs every 4 hours as  needed.  8.  Methotrexate 2.5 mg tablets take 6 tablets once a week.  9.  Potassium chloride 10 mEq tablet by mouth 3 times a day.  10.  Hydroxychloroquine 200 mg twice a day orally.  11.  Vancomycin 250 mg 4 times a day.  12.  Zofran 4 mg every 6 hours as needed. 13.  Phenergan 25 mg every 6 hours as needed for nausea.  ALLERGIES:  SULFA.   PHYSICAL EXAMINATION: VITAL SIGNS: Temperature 100.6, pulse rate 106, respirations 17, blood pressure 115/67 and pulse oximetry 94%  at rest on room air.  GENERAL: Fully alert, oriented in no acute distress. She is comfortable and cooperative with history taking and physical examination.  HEENT: Head and neck atraumatic. Oral mucosa dry. Conjunctivae pink.   NECK: Supple. No JVD.  RESPIRATORY: Bilateral clear and equal air entry.  CARDIOVASCULAR: S1, S2 present, regular. No murmur.  ABDOMEN: Mild tender on pressure and slightly distended. Bowel sounds present. No organomegaly appreciated.  SKIN: No rashes.  LEGS: No edema.  NEUROLOGICAL: Power 5/5. No gross abnormality appreciated. Follows commands.  PSYCHIATRIC: Does not appear in any acute psychiatric illness at this point.   LABORATORY DATA:  Glucose 94. BUN 15, creatinine 1.21, sodium 134, potassium 3.5, chloride 102, CO2 23, calcium 8.6, total protein 6.9, albumin 3.0, bilirubin 0.6, alkaline phosphate 80, SGOT 25, SGPT 20, WBC 4, erythrocyte sedimentation rate 46, hemoglobin 13.5, platelet count 222, MCV 90.   Stool culture negative for Campylobacter   ASSESSMENT AND PLAN:  1.  Clostridium difficile colitis, diarrhea. She is currently on treatment of Clostridium difficile for the last 7 to 8 days with oral vancomycin and she was initially feeling okay, but then again had recurrence and started having vomiting and abdominal pain. We will do the stool culture to rule out any other infection. Stool for Clostridium difficile and stool culture also collected at Dr. Percell Boston office yesterday. Currently we will continue her vancomycin and wait for the gastroenterology to come and comment. 2.  Dehydration due to nausea and vomiting, diarrhea. We will give IV fluids and recheck a basic metabolic panel.  3.  Nausea and vomiting. We will give Zofran IV to prevent the symptoms. 4.  Sarcoidosis. We will continue her home medication, hydroxychloroquine and methotrexate at home doses and Advair and Ventolin inhalers as needed basis.  5.  Hypothyroidism. We will continue Synthroid as  she was taking at home dose.  6.  Fever. She might be having this fever as a result of Clostridium difficile or septicemia. We will check her blood culture and do follow-up. Currently, she does not need any systemic antibiotics for that. We will wait and watch.  7.  GI prophylaxis with IV Nexium.  8.  Deep vein thrombosis prophylaxis with sequential compressive device.   TOTAL TIME SPENT: 60 minutes.   CODE STATUS: Full code.    ____________________________ Ceasar Lund Anselm Jungling, MD vgv:cc D: 12/26/2012 18:57:29 ET T: 12/26/2012 20:11:30 ET JOB#: [id number]  cc: Ceasar Lund. Anselm Jungling, MD, <Dictator> Manya Silvas, MD Vaughan Basta MD ELECTRONICALLY SIGNED 01/14/2013 9:31

## 2015-02-16 ENCOUNTER — Ambulatory Visit: Payer: BLUE CROSS/BLUE SHIELD

## 2015-02-18 ENCOUNTER — Ambulatory Visit: Payer: BLUE CROSS/BLUE SHIELD

## 2015-02-20 ENCOUNTER — Ambulatory Visit
Admission: RE | Admit: 2015-02-20 | Discharge: 2015-02-20 | Disposition: A | Discharge status: Home / Self Care | Payer: BLUE CROSS/BLUE SHIELD | Source: Ambulatory Visit | Attending: Pulmonary Disease | Admitting: Pulmonary Disease

## 2015-02-20 ENCOUNTER — Ambulatory Visit: Payer: BLUE CROSS/BLUE SHIELD

## 2015-02-20 DIAGNOSIS — R911 Solitary pulmonary nodule: Secondary | ICD-10-CM | POA: Insufficient documentation

## 2015-02-20 DIAGNOSIS — D860 Sarcoidosis of lung: Secondary | ICD-10-CM

## 2015-02-20 DIAGNOSIS — D869 Sarcoidosis, unspecified: Secondary | ICD-10-CM | POA: Insufficient documentation

## 2015-02-23 ENCOUNTER — Ambulatory Visit: Payer: BLUE CROSS/BLUE SHIELD

## 2015-02-25 ENCOUNTER — Ambulatory Visit: Payer: BLUE CROSS/BLUE SHIELD

## 2015-02-26 ENCOUNTER — Ambulatory Visit (INDEPENDENT_AMBULATORY_CARE_PROVIDER_SITE_OTHER): Payer: BLUE CROSS/BLUE SHIELD | Admitting: Pulmonary Disease

## 2015-02-26 ENCOUNTER — Telehealth: Payer: Self-pay | Admitting: Pulmonary Disease

## 2015-02-26 ENCOUNTER — Encounter: Payer: Self-pay | Admitting: Pulmonary Disease

## 2015-02-26 VITALS — BP 116/66 | HR 112 | Temp 98.3°F | Ht 68.0 in | Wt 189.0 lb

## 2015-02-26 DIAGNOSIS — Z5181 Encounter for therapeutic drug level monitoring: Secondary | ICD-10-CM | POA: Diagnosis not present

## 2015-02-26 DIAGNOSIS — G4733 Obstructive sleep apnea (adult) (pediatric): Secondary | ICD-10-CM

## 2015-02-26 DIAGNOSIS — D860 Sarcoidosis of lung: Secondary | ICD-10-CM | POA: Diagnosis not present

## 2015-02-26 MED ORDER — AMOXICILLIN 250 MG PO CAPS: 250.0000 mg | ORAL_CAPSULE | Freq: Three times a day (TID) | ORAL | Status: DC

## 2015-02-26 MED ORDER — MYCOPHENOLATE MOFETIL 500 MG PO TABS: ORAL_TABLET | ORAL | Status: DC

## 2015-02-26 NOTE — Patient Instructions (Signed)
Take the amoxicillin 3 times a day for 5 days with a probiotic.  Let us know if your fever gets worse  After completing the amoxicillin and start taking Cellcept 500 mg twice a day. After one week of taking that dose 1000 mg twice a day. During this time we are going to be monitoring your kidney and liver function closely. We will also monitor your cell counts. Will need lab testing every week for a month and then monthly after that.  As far as the prednisone dose, 5 mg every other day until you start taking 1000 mg twice a day of the CellCept.  We will see you back in 2 months or sooner if needed. We will plan on getting a lung function test in August or September of this year

## 2015-02-26 NOTE — Assessment & Plan Note (Signed)
Continue CPAP every night with oxygen. She would like to know if her oxygen level is still low.  Plan: Overnight oximetry testing

## 2015-02-26 NOTE — Progress Notes (Signed)
Subjective:    Patient ID: Stacie [name], female    DOB: [birth year], 59 y.o.   MRN: [id number]   Synopsis: Stacie [name] first saw the [facility] pulmonary clinic in May 2014 for evaluation of sarcoidosis. She was diagnosed with sarcoid in 2009 at the [facility] via an EBUS guided fine needle aspiration of a hilar lymph node as well as an endobronchial biopsy which showed noncaseating granulomas with negative special stains for infectious organisms. She was treated off and on with steroids from 2009 2 2014 and then in 2014 was started on methotrexate in addition to Plaquenil. She also has chronic kidney disease which is believed to be directly related to sarcoidosis.  She had no improvement with methotrexate and plaquenil and her TLC and DLCO declined slightly so in January 2015 she was started on imuran but this had to be held because of increasing LFTs. She ended up seeing Dr. Lenise Arena at [facility] in 01/2014 for her sarcoid.  He felt like her symptoms were more due to Asthma than Sarcoid.  Plaquenil was restarted, prednisone tapered, and Advair changed to QVar.  HPI Chief Complaint  Patient presents with  . Follow-up    pt feels good today; she has a low grade temp last night 101.3; pt says cough had slowed down until past few days she thinks it may be related to allergy.. She is now on Prednisone 5 mg eod.   Stacie [name] has been struggling with allergies recently, but she says that she has been feeling fine.  She had a fever and chills last night. She continues to have a cough.  She produces a small amount of milky white to yellow mucus.  No pain.  She is definitely better than she was several weeks ago.  No chest pain or fever.  Past Medical History  Diagnosis Date  . Sarcoid      Review of Systems  Constitutional: Negative for fever, chills and fatigue.  HENT: Positive for postnasal drip and rhinorrhea. Negative for congestion, nosebleeds and sinus  pressure.   Respiratory: Positive for cough. Negative for shortness of breath, wheezing and stridor.   Cardiovascular: Negative for chest pain, palpitations and leg swelling.       Objective:   Physical Exam Filed Vitals:   02/26/15 1011  BP: 116/66  Pulse: 112  Temp: 98.3 F (36.8 C)  Height: _0  (1.727 m)  Weight: 189 lb (85.73 kg)  SpO2: 93%  RA  Gen: Well appearing, no acute distress HEENT: NCAT, EOMi, OP clear without thrush PULM: Few crackles upper lobes, no wheezing CV: Regular rate and rhythm, no mgr, no JVD AB: BS+, soft, nontender Ext: warm, no edema, no clubbing, no cyanosis  Diagnosed 2009 [facility] with EBUS FNA of a hilar lymph node as well as an endobronchial lesion showing noncaseating granulomas, special stains were negative for fungal or AFB Prednisone off and on 2003-2014 11/2012 MTX/HCQ started [facility] Kidney involvement 03/12/2013 6 MW > 1500 feet, O2 saturation nadir 88% RA, HR max 132 03/2013 PFT > Ratio 66%, FEV 2.2L (82% pred), TLC 4.62 L (81% pred), DLCO 16.7 68% 09/2013 PFT > ratio 63%, FEV1 1.99> 2.08L with bronchodilator (76% predicted, 5%), TLC 4.25L (72% pred), DLCO 15.5 (64% pred) 10/2013 imuran started 09/2013 TPMT activity 19 > normal 10/2013 Stop MTX, start Imuran> goal dose is 191m/day (renal adjustment); plan start 531mdaily, increase 2542mvery 3 weeks; weekly CBC/CMET until 125m29my, then q-12 weeks;  11/2013 had  to stop imuran due to LFT abnormalities on two tries 02/24/2014 CXR> improved bilateral airspace disease, upper lobe scarring noted 02/24/2014 PFT> Ratio FEV1 1.99L (75% pred, 12% change with BD), TLC 4.04 L (71% pred), DLCO 16.9 (71% pred) 06/2014 Echo> LVEF 50-55%, normal global LVF funtion, mild MR, estimated PA systolic 41.6, mild TR, RV size normal 06/2014 PFT> ratio 55%, FEV1 1.39L (51% pred), FVC 2.52L (69%pred), TLCO 4.41L (75% pred), DLCO 64% pred 10/2014 PFT> Ratio 64% FEV1 1.52L (57% pred), FVC 2.39L (67% pred), TLC 3.58 (63% pred),  DLCO 12.7 (53% pred) 10/2014 6MW> 1490fet O2 83% RA  May 2016 high resolution CT chest images reviewed in clinic today, there are chronic fibrotic changes and honeycombing in a bronchovascular pattern in the upper lobes bilaterally but the groundglass changes from January have resolved.     Assessment & Plan:   Obstructive sleep apnea Continue CPAP every night with oxygen. She would like to know if her oxygen level is still low.  Plan: Overnight oximetry testing   Pulmonary sarcoidosis This has been a stable interval for Stacie [name] As noted previously she does well when treated with immunosuppressant (specifically prednisone). She has failed both methotrexate and Imuran.  Because of her concern for long-term side effects from prednisone it is reasonable to try another immunosuppressant. I [facility]. Many of them are using CellCept.  Plan: Start taking CellCept 500 mg twice a day for 1 week, then increase to 1 g twice a day Once taking redness and 5 mg every other day, stop Check CBC and comprehensive metabolic panel weekly for the first month and then monthly to monitor for side effects from CellCept.     Updated Medication List Outpatient Encounter Prescriptions as of 02/26/2015  Medication Sig  . albuterol (VENTOLIN HFA) 108 (90 BASE) MCG/ACT inhaler Inhale 2 puffs into the lungs every 6 (six) hours as needed for wheezing.  .Marland Kitchenaspirin 81 MG tablet Take 81 mg by mouth daily.  . fluticasone-salmeterol (ADVAIR HFA) 230-21 MCG/ACT inhaler Inhale 2 puffs into the lungs 2 (two) times daily.  .Marland KitchenGLUCOSAMINE PO Take 1 tablet by mouth daily.  . Hydrocodone-Chlorpheniramine 5-4 MG/5ML SOLN Take 5 mLs by mouth 2 (two) times daily as needed (cough).  .Marland Kitchenlevothyroxine (SYNTHROID, LEVOTHROID) 137 MCG tablet Take 137 mcg by mouth daily before breakfast.  . mometasone (NASONEX) 50 MCG/ACT nasal spray Place 1 spray into the nose 2 (two)  times daily.  . Multiple Vitamin (MULTIVITAMIN) capsule Take 1 capsule by mouth 2 (two) times daily.   .Marland Kitchenomeprazole (PRILOSEC) 40 MG capsule Take 40 mg by mouth daily.  .Marland KitchenPARoxetine (PAXIL) 10 MG tablet Take 1 tablet (10 mg total) by mouth at bedtime.  . predniSONE (DELTASONE) 5 MG tablet 4 tabs daily for 7 days, then 3 tabs daily x 7d, then 2 tabs daily x 7d, then 1 tab daily x7d (Patient taking differently: Take 5 mg by mouth every other day. )  . ranitidine (ZANTAC) 150 MG tablet Take 150 mg by mouth at bedtime.  .Marland Kitchenamoxicillin (AMOXIL) 250 MG capsule Take 1 capsule (250 mg total) by mouth 3 (three) times daily.  . mycophenolate (CELLCEPT) 500 MG tablet Take 509mbid for one week, then increase to 10005mid   No facility-administered encounter medications on file as of 02/26/2015.

## 2015-02-26 NOTE — Assessment & Plan Note (Signed)
This has been a stable interval for [facility]. As noted previously she does well when treated with immunosuppressant (specifically prednisone). She has failed both methotrexate and Imuran.  Because of her concern for long-term side effects from prednisone it is reasonable to try another immunosuppressant. I have spoken with my colleagues from [facility] who treat sarcoid frequently. Many of them are using CellCept.  Plan: Start taking CellCept 500 mg twice a day for 1 week, then increase to 1 g twice a day Once taking redness and 5 mg every other day, stop Check CBC and comprehensive metabolic panel weekly for the first month and then monthly to monitor for side effects from CellCept.

## 2015-02-26 NOTE — Telephone Encounter (Signed)
Spoke with pt. She was not aware that it needs to go to [facility]. States she has never used [employer] previously. [facility] and [facility]. States she will call back to let us know if we are sending it to [facility] or somewhere else. Will be awaiting pt's return call.

## 2015-02-27 ENCOUNTER — Ambulatory Visit: Payer: BLUE CROSS/BLUE SHIELD

## 2015-02-27 MED ORDER — MYCOPHENOLATE MOFETIL 500 MG PO TABS: ORAL_TABLET | ORAL | Status: DC

## 2015-02-27 NOTE — Telephone Encounter (Signed)
Called and spoke with patient- request 30-day supply of cellcept be sent to [facility]- local pharmacy can not fill this. This has been taken care of - pt aware. Nothing further needed.

## 2015-02-27 NOTE — Telephone Encounter (Signed)
Pt calling stating that the cellcept needs to be sent  electronically to [facility].Hillery Hunter

## 2015-03-02 ENCOUNTER — Ambulatory Visit: Payer: BLUE CROSS/BLUE SHIELD

## 2015-03-04 ENCOUNTER — Ambulatory Visit: Payer: BLUE CROSS/BLUE SHIELD

## 2015-03-06 ENCOUNTER — Ambulatory Visit: Payer: BLUE CROSS/BLUE SHIELD

## 2015-03-09 ENCOUNTER — Telehealth: Payer: Self-pay | Admitting: Pulmonary Disease

## 2015-03-09 ENCOUNTER — Ambulatory Visit: Payer: BLUE CROSS/BLUE SHIELD

## 2015-03-09 MED ORDER — MYCOPHENOLATE MOFETIL 500 MG PO TABS: ORAL_TABLET | ORAL | Status: DC

## 2015-03-09 NOTE — Telephone Encounter (Signed)
Spoke with pt. She is referring to her Cellcept rx. This has been resent to [place]. Nothing further was needed.

## 2015-03-11 ENCOUNTER — Telehealth: Payer: Self-pay | Admitting: Pulmonary Disease

## 2015-03-11 ENCOUNTER — Ambulatory Visit: Payer: BLUE CROSS/BLUE SHIELD

## 2015-03-11 MED ORDER — AMOXICILLIN 500 MG PO CAPS: 500.0000 mg | ORAL_CAPSULE | Freq: Two times a day (BID) | ORAL | Status: DC

## 2015-03-11 NOTE — Telephone Encounter (Signed)
Amoxicillin 547m q12 hours times 7 days, take with probiotic

## 2015-03-11 NOTE — Telephone Encounter (Signed)
Spoke with pt. States that her cough has come back. It is now producing yellow mucus. She has finished the antibiotic that BQ gave her. Thinks she needs another round of something.  She is also having issues with getting Cellcept filled. I called [facility], they do not participate with her insurance. Advised the pt that she will need to check with her insurance and figure out who they participate with. She will call us back.  BQ - please advise on cough. Thanks.

## 2015-03-11 NOTE — Telephone Encounter (Signed)
Spoke with pt and informed her that we were sending rx for abx to pharmacy and for her to take it with a probiotic such as Align.  Pt also states that she spoke with her insurance in regards to the Cellcept and that it has been taken care of. That the issue was that she was not registered with the mail order and her medication will be mailed out to her.  Nothing further needed at this time.

## 2015-03-12 ENCOUNTER — Telehealth: Payer: Self-pay | Admitting: Pulmonary Disease

## 2015-03-12 NOTE — Telephone Encounter (Signed)
Spoke with pt, states she had her overnight oximetry dropped off today with instructions to wear the pulse ox while on her cpap and 02.  Pt is wanting to do this without her 02 tonight to see if she can come off her 02.   Dr. Lake Bells are you ok with pt doing ono on room air to see if she can come off 02?  Thanks!

## 2015-03-13 ENCOUNTER — Ambulatory Visit: Payer: BLUE CROSS/BLUE SHIELD

## 2015-03-13 NOTE — Telephone Encounter (Signed)
Will send carrier to pick up ono and they should results by this afternoon, per [facility]

## 2015-03-13 NOTE — Telephone Encounter (Signed)
Melissa cb stating they will not be able to get ono machine today and apologizes for the miss communication, [phone]

## 2015-03-13 NOTE — Telephone Encounter (Signed)
Sure, do it on room air, worth a shot, but I think she is going to need the oxygen so tell her not to send it back

## 2015-03-13 NOTE — Telephone Encounter (Signed)
[email or web address] for Stacie [name] with [facility]

## 2015-03-13 NOTE — Telephone Encounter (Signed)
Spoke with pt.  States she completed ONO last night on o2.  This was delivered through [facility].  Pt is aware I will contact [place] to try to get these results and to try to keep machine until BQ has reviewed results to see if he would like to proceed with doing ONO on RA with cpap.    Spoke with Melissa with [facility].  Discussed situation.  She will check on the status of the ONO results and will call back with update.

## 2015-03-17 NOTE — Telephone Encounter (Signed)
Spoke with Fritz Pickerel at [facility], I explained the situation to him and he tried to call respiratory, but did not get a response.   States he will send a message to respiratory and give [place] an update on the ONO machine once he hears from them.   Will await call.

## 2015-03-18 ENCOUNTER — Ambulatory Visit: Payer: BLUE CROSS/BLUE SHIELD

## 2015-03-19 ENCOUNTER — Other Ambulatory Visit: Payer: Self-pay

## 2015-03-19 ENCOUNTER — Telehealth: Payer: Self-pay | Admitting: Pulmonary Disease

## 2015-03-19 ENCOUNTER — Telehealth: Payer: Self-pay

## 2015-03-19 DIAGNOSIS — G4733 Obstructive sleep apnea (adult) (pediatric): Secondary | ICD-10-CM

## 2015-03-19 NOTE — Telephone Encounter (Signed)
This has been received and placed in BQ's look-at folder.

## 2015-03-19 NOTE — Telephone Encounter (Signed)
Called spoke Freda Munro w/ [facility] who reported that the ONO was done 5/31 and the results were faxed to [phone] Asked Freda Munro if these results could be refaxed to triage as Caryl Pina is at one of our satellite offices today ONO to be faxed to triage to my attention to hold for Caryl Pina Will await fax

## 2015-03-19 NOTE — Telephone Encounter (Signed)
Spoke with pt, states she was to start Cellcept after her last ov which was called in to [facility]- pt has still not received this shipment.   Called [facility] and spoke with Barnett Applebaum, said that she could not locate an account for this patient and gave me a number to call and reorder the rx.  Called the number Barnett Applebaum gave me and it was a fax #. Called [facility] again, they were not able to see pt in the system.   Called pt to make aware, and gave her the number to [facility] to re-establish her account.  Pt will call us back with this info.  Will await call.

## 2015-03-19 NOTE — Telephone Encounter (Signed)
-----  Message from Juanito Doom, MD sent at 03/19/2015  2:54 PM EDT ----- A, Please let her know that her ONO on RA with CPAP was normal, she doesn't need O2 anymore Thanks B

## 2015-03-19 NOTE — Telephone Encounter (Signed)
This has been faxed to [facility]. Will forward message to her. Please advise thanks

## 2015-03-19 NOTE — Telephone Encounter (Signed)
Yes please fax it to [facility].  Thanks!

## 2015-03-19 NOTE — Telephone Encounter (Signed)
ONO received Stacie [name], would you like me to fax it out to you or hold for when you return to [place]?  Thanks!

## 2015-03-19 NOTE — Telephone Encounter (Signed)
See 03/19/15 phone note.  Nothing further needed.

## 2015-03-19 NOTE — Telephone Encounter (Signed)
Spoke with pt, aware of results/recs.  Order to d/c nocturnal 02 has been placed.  Nothing further needed.

## 2015-03-19 NOTE — Telephone Encounter (Signed)
BQ- pt called back saying her ono was done on 2lpm 02.  She wanted to let you know.

## 2015-03-20 ENCOUNTER — Ambulatory Visit: Payer: BLUE CROSS/BLUE SHIELD

## 2015-03-20 NOTE — Addendum Note (Signed)
Addended by: Virl Cagey on: 03/20/2015 02:24 PM   Modules accepted: Orders

## 2015-03-20 NOTE — Telephone Encounter (Signed)
Pt states that she thinks that she has gotten this figured out with [facility] and they should be shipping her meds soon. Will call if any issues with getting meds. Nothing further needed.

## 2015-03-20 NOTE — Telephone Encounter (Signed)
Left message for patient to call back.

## 2015-03-20 NOTE — Telephone Encounter (Signed)
Yep, need it on RA, not 2L

## 2015-03-20 NOTE — Telephone Encounter (Addendum)
Pt already made aware that order will be replaced for ONO on RA on CPAP as her first one was done incorrectly.  Order placed, [facility] aware and Suanne Marker is working on getting this scheduled. Nothing further needed.

## 2015-03-20 NOTE — Telephone Encounter (Signed)
Spoke with pt- order for D/C nocturnal O2 has been cancelled for now.  Pt states that she was informed by [facility] that her ONO was done on 2L O2 instead of on RA as ordered.  This needs to be repeated- pt would like to know when this will be done.  Please advise Dr Lake Bells on order. Thanks.

## 2015-03-23 ENCOUNTER — Ambulatory Visit: Payer: BLUE CROSS/BLUE SHIELD

## 2015-03-24 ENCOUNTER — Other Ambulatory Visit: Payer: Self-pay

## 2015-03-24 MED ORDER — MYCOPHENOLATE MOFETIL 500 MG PO TABS: ORAL_TABLET | ORAL | Status: DC

## 2015-03-25 ENCOUNTER — Ambulatory Visit: Payer: BLUE CROSS/BLUE SHIELD

## 2015-03-27 ENCOUNTER — Ambulatory Visit: Payer: BLUE CROSS/BLUE SHIELD

## 2015-03-27 ENCOUNTER — Other Ambulatory Visit: Payer: Self-pay

## 2015-03-27 DIAGNOSIS — Z90710 Acquired absence of both cervix and uterus: Secondary | ICD-10-CM | POA: Insufficient documentation

## 2015-03-27 DIAGNOSIS — E039 Hypothyroidism, unspecified: Secondary | ICD-10-CM | POA: Insufficient documentation

## 2015-03-27 DIAGNOSIS — Z87442 Personal history of urinary calculi: Secondary | ICD-10-CM | POA: Insufficient documentation

## 2015-03-27 DIAGNOSIS — Z8742 Personal history of other diseases of the female genital tract: Secondary | ICD-10-CM | POA: Insufficient documentation

## 2015-03-27 DIAGNOSIS — I10 Essential (primary) hypertension: Secondary | ICD-10-CM | POA: Insufficient documentation

## 2015-03-27 MED ORDER — LEVOTHYROXINE SODIUM 137 MCG PO TABS: 137.0000 ug | ORAL_TABLET | Freq: Every day | ORAL | Status: DC

## 2015-03-30 ENCOUNTER — Ambulatory Visit: Payer: BLUE CROSS/BLUE SHIELD

## 2015-03-30 ENCOUNTER — Other Ambulatory Visit: Payer: Self-pay | Admitting: Internal Medicine

## 2015-03-30 MED ORDER — LEVOTHYROXINE SODIUM 137 MCG PO TABS: 137.0000 ug | ORAL_TABLET | Freq: Every day | ORAL | Status: DC

## 2015-04-01 ENCOUNTER — Ambulatory Visit: Payer: BLUE CROSS/BLUE SHIELD

## 2015-04-03 ENCOUNTER — Ambulatory Visit: Payer: BLUE CROSS/BLUE SHIELD

## 2015-04-03 ENCOUNTER — Telehealth: Payer: Self-pay | Admitting: Pulmonary Disease

## 2015-04-03 NOTE — Telephone Encounter (Signed)
Patient wants to know results of ONO.  She says that she is going out of town and would like to know if she needs to take her oxygen with her.   Dr. Lake Bells, please advise.

## 2015-04-06 ENCOUNTER — Ambulatory Visit: Payer: BLUE CROSS/BLUE SHIELD

## 2015-04-06 NOTE — Telephone Encounter (Signed)
I will look for the ONO and let her know by tomorrow re Oxygen.  As for the labwork, she needs to get weekly CBC and BMET for the next four weeks starting now

## 2015-04-06 NOTE — Telephone Encounter (Signed)
Pt aware of recs.  Will forward message to myself to follow up on ONO results tomorrow.

## 2015-04-07 NOTE — Telephone Encounter (Signed)
A   Her ONO showed that her O2 dropped < 88% for about an hour and a half even on CPAP.   She needs to keep the oxygen with CPAP, but traveling for a few days off of it (while still using CPAP) is OK   Thanks  B  -------------------------------------  Spoke with pt, aware of results.  Nothing further needed.

## 2015-04-08 ENCOUNTER — Ambulatory Visit: Payer: BLUE CROSS/BLUE SHIELD

## 2015-04-10 ENCOUNTER — Ambulatory Visit: Payer: BLUE CROSS/BLUE SHIELD

## 2015-04-10 ENCOUNTER — Other Ambulatory Visit (INDEPENDENT_AMBULATORY_CARE_PROVIDER_SITE_OTHER): Payer: BLUE CROSS/BLUE SHIELD

## 2015-04-10 DIAGNOSIS — Z5181 Encounter for therapeutic drug level monitoring: Secondary | ICD-10-CM

## 2015-04-10 LAB — CBC WITH DIFFERENTIAL/PLATELET
Basophils Absolute: 0 10*3/uL (ref 0.0–0.1)
Basophils Relative: 0.5 % (ref 0.0–3.0)
Eosinophils Absolute: 0.4 10*3/uL (ref 0.0–0.7)
Eosinophils Relative: 5 % (ref 0.0–5.0)
HCT: 41.8 % (ref 36.0–46.0)
Hemoglobin: 14.3 g/dL (ref 12.0–15.0)
Lymphocytes Relative: 18.6 % (ref 12.0–46.0)
Lymphs Abs: 1.6 10*3/uL (ref 0.7–4.0)
MCHC: 34.2 g/dL (ref 30.0–36.0)
MCV: 88.8 fl (ref 78.0–100.0)
Monocytes Absolute: 1 10*3/uL (ref 0.1–1.0)
Monocytes Relative: 11.7 % (ref 3.0–12.0)
Neutro Abs: 5.4 10*3/uL (ref 1.4–7.7)
Neutrophils Relative %: 64.2 % (ref 43.0–77.0)
Platelets: 301 10*3/uL (ref 150.0–400.0)
RBC: 4.7 Mil/uL (ref 3.87–5.11)
RDW: 13 % (ref 11.5–15.5)
WBC: 8.4 10*3/uL (ref 4.0–10.5)

## 2015-04-10 LAB — COMPREHENSIVE METABOLIC PANEL
ALT: 16 U/L (ref 0–35)
AST: 20 U/L (ref 0–37)
Albumin: 4 g/dL (ref 3.5–5.2)
Alkaline Phosphatase: 84 U/L (ref 39–117)
BUN: 30 mg/dL — ABNORMAL HIGH (ref 6–23)
CO2: 32 mEq/L (ref 19–32)
Calcium: 10.1 mg/dL (ref 8.4–10.5)
Chloride: 102 mEq/L (ref 96–112)
Creatinine, Ser: 1.26 mg/dL — ABNORMAL HIGH (ref 0.40–1.20)
GFR: 46.04 mL/min — ABNORMAL LOW (ref >60.00)
Glucose, Bld: 94 mg/dL (ref 70–99)
Potassium: 3.8 mEq/L (ref 3.5–5.1)
Sodium: 141 mEq/L (ref 135–145)
Total Bilirubin: 0.5 mg/dL (ref 0.2–1.2)
Total Protein: 7.7 g/dL (ref 6.0–8.3)

## 2015-04-13 ENCOUNTER — Ambulatory Visit: Payer: BLUE CROSS/BLUE SHIELD

## 2015-04-15 ENCOUNTER — Ambulatory Visit: Payer: BLUE CROSS/BLUE SHIELD

## 2015-04-17 ENCOUNTER — Ambulatory Visit: Payer: BLUE CROSS/BLUE SHIELD

## 2015-04-22 ENCOUNTER — Other Ambulatory Visit (INDEPENDENT_AMBULATORY_CARE_PROVIDER_SITE_OTHER): Payer: BLUE CROSS/BLUE SHIELD

## 2015-04-22 ENCOUNTER — Encounter: Payer: Self-pay | Admitting: Pulmonary Disease

## 2015-04-22 DIAGNOSIS — Z5181 Encounter for therapeutic drug level monitoring: Secondary | ICD-10-CM

## 2015-04-22 LAB — COMPREHENSIVE METABOLIC PANEL
ALT: 18 U/L (ref 0–35)
AST: 24 U/L (ref 0–37)
Albumin: 3.8 g/dL (ref 3.5–5.2)
Alkaline Phosphatase: 84 U/L (ref 39–117)
BUN: 23 mg/dL (ref 6–23)
CO2: 31 mEq/L (ref 19–32)
Calcium: 10.6 mg/dL — ABNORMAL HIGH (ref 8.4–10.5)
Chloride: 100 mEq/L (ref 96–112)
Creatinine, Ser: 1.24 mg/dL — ABNORMAL HIGH (ref 0.40–1.20)
GFR: 46.9 mL/min — ABNORMAL LOW (ref >60.00)
Glucose, Bld: 92 mg/dL (ref 70–99)
Potassium: 4.2 mEq/L (ref 3.5–5.1)
Sodium: 139 mEq/L (ref 135–145)
Total Bilirubin: 0.3 mg/dL (ref 0.2–1.2)
Total Protein: 8 g/dL (ref 6.0–8.3)

## 2015-04-23 LAB — CBC WITH DIFFERENTIAL/PLATELET
Basophils Absolute: 0.1 10*3/uL (ref 0.0–0.1)
Basophils Relative: 0.6 % (ref 0.0–3.0)
Eosinophils Absolute: 0.5 10*3/uL (ref 0.0–0.7)
Eosinophils Relative: 3.9 % (ref 0.0–5.0)
HCT: 42.3 % (ref 36.0–46.0)
Hemoglobin: 14.2 g/dL (ref 12.0–15.0)
Lymphocytes Relative: 18.4 % (ref 12.0–46.0)
Lymphs Abs: 2.2 10*3/uL (ref 0.7–4.0)
MCHC: 33.5 g/dL (ref 30.0–36.0)
MCV: 87.8 fl (ref 78.0–100.0)
Monocytes Absolute: 1 10*3/uL (ref 0.1–1.0)
Monocytes Relative: 8.1 % (ref 3.0–12.0)
Neutro Abs: 8.2 10*3/uL — ABNORMAL HIGH (ref 1.4–7.7)
Neutrophils Relative %: 69 % (ref 43.0–77.0)
Platelets: 297 10*3/uL (ref 150.0–400.0)
RBC: 4.82 Mil/uL (ref 3.87–5.11)
RDW: 13.2 % (ref 11.5–15.5)
WBC: 11.9 10*3/uL — ABNORMAL HIGH (ref 4.0–10.5)

## 2015-04-29 ENCOUNTER — Other Ambulatory Visit (INDEPENDENT_AMBULATORY_CARE_PROVIDER_SITE_OTHER): Payer: 59

## 2015-04-29 DIAGNOSIS — Z5181 Encounter for therapeutic drug level monitoring: Secondary | ICD-10-CM

## 2015-04-29 LAB — COMPREHENSIVE METABOLIC PANEL
ALT: 13 U/L (ref 0–35)
AST: 15 U/L (ref 0–37)
Albumin: 3.8 g/dL (ref 3.5–5.2)
Alkaline Phosphatase: 81 U/L (ref 39–117)
BUN: 26 mg/dL — ABNORMAL HIGH (ref 6–23)
CO2: 33 mEq/L — ABNORMAL HIGH (ref 19–32)
Calcium: 9.7 mg/dL (ref 8.4–10.5)
Chloride: 101 mEq/L (ref 96–112)
Creatinine, Ser: 0.97 mg/dL (ref 0.40–1.20)
GFR: 62.25 mL/min (ref >60.00)
Glucose, Bld: 94 mg/dL (ref 70–99)
Potassium: 3.4 mEq/L — ABNORMAL LOW (ref 3.5–5.1)
Sodium: 142 mEq/L (ref 135–145)
Total Bilirubin: 0.3 mg/dL (ref 0.2–1.2)
Total Protein: 7.1 g/dL (ref 6.0–8.3)

## 2015-04-29 LAB — CBC WITH DIFFERENTIAL/PLATELET
Basophils Absolute: 0 10*3/uL (ref 0.0–0.1)
Basophils Relative: 0.3 % (ref 0.0–3.0)
Eosinophils Absolute: 0.2 10*3/uL (ref 0.0–0.7)
Eosinophils Relative: 1.8 % (ref 0.0–5.0)
HCT: 40.7 % (ref 36.0–46.0)
Hemoglobin: 13.8 g/dL (ref 12.0–15.0)
Lymphocytes Relative: 21.3 % (ref 12.0–46.0)
Lymphs Abs: 2.8 10*3/uL (ref 0.7–4.0)
MCHC: 33.9 g/dL (ref 30.0–36.0)
MCV: 85.6 fl (ref 78.0–100.0)
Monocytes Absolute: 1 10*3/uL (ref 0.1–1.0)
Monocytes Relative: 7.6 % (ref 3.0–12.0)
Neutro Abs: 9 10*3/uL — ABNORMAL HIGH (ref 1.4–7.7)
Neutrophils Relative %: 69 % (ref 43.0–77.0)
Platelets: 293 10*3/uL (ref 150.0–400.0)
RBC: 4.76 Mil/uL (ref 3.87–5.11)
RDW: 13.3 % (ref 11.5–15.5)
WBC: 13.1 10*3/uL — ABNORMAL HIGH (ref 4.0–10.5)

## 2015-05-06 ENCOUNTER — Telehealth: Payer: Self-pay | Admitting: Pulmonary Disease

## 2015-05-06 ENCOUNTER — Other Ambulatory Visit (INDEPENDENT_AMBULATORY_CARE_PROVIDER_SITE_OTHER): Payer: 59

## 2015-05-06 DIAGNOSIS — Z5181 Encounter for therapeutic drug level monitoring: Secondary | ICD-10-CM

## 2015-05-06 LAB — CBC WITH DIFFERENTIAL/PLATELET
Basophils Absolute: 0.1 10*3/uL (ref 0.0–0.1)
Basophils Relative: 0.5 % (ref 0.0–3.0)
Eosinophils Absolute: 0.3 10*3/uL (ref 0.0–0.7)
Eosinophils Relative: 3.1 % (ref 0.0–5.0)
HCT: 40.5 % (ref 36.0–46.0)
Hemoglobin: 13.5 g/dL (ref 12.0–15.0)
Lymphocytes Relative: 12.4 % (ref 12.0–46.0)
Lymphs Abs: 1.2 10*3/uL (ref 0.7–4.0)
MCHC: 33.3 g/dL (ref 30.0–36.0)
MCV: 86.2 fl (ref 78.0–100.0)
Monocytes Absolute: 0.8 10*3/uL (ref 0.1–1.0)
Monocytes Relative: 7.6 % (ref 3.0–12.0)
Neutro Abs: 7.7 10*3/uL (ref 1.4–7.7)
Neutrophils Relative %: 76.4 % (ref 43.0–77.0)
Platelets: 253 10*3/uL (ref 150.0–400.0)
RBC: 4.7 Mil/uL (ref 3.87–5.11)
RDW: 13.9 % (ref 11.5–15.5)
WBC: 10 10*3/uL (ref 4.0–10.5)

## 2015-05-06 LAB — COMPREHENSIVE METABOLIC PANEL
ALT: 18 U/L (ref 0–35)
AST: 17 U/L (ref 0–37)
Albumin: 3.5 g/dL (ref 3.5–5.2)
Alkaline Phosphatase: 86 U/L (ref 39–117)
BUN: 30 mg/dL — ABNORMAL HIGH (ref 6–23)
CO2: 32 mEq/L (ref 19–32)
Calcium: 9.3 mg/dL (ref 8.4–10.5)
Chloride: 103 mEq/L (ref 96–112)
Creatinine, Ser: 1.01 mg/dL (ref 0.40–1.20)
GFR: 59.41 mL/min — ABNORMAL LOW (ref >60.00)
Glucose, Bld: 111 mg/dL — ABNORMAL HIGH (ref 70–99)
Potassium: 3.5 mEq/L (ref 3.5–5.1)
Sodium: 141 mEq/L (ref 135–145)
Total Bilirubin: 0.3 mg/dL (ref 0.2–1.2)
Total Protein: 6.7 g/dL (ref 6.0–8.3)

## 2015-05-07 MED ORDER — MYCOPHENOLATE MOFETIL 500 MG PO TABS: 1000.0000 mg | ORAL_TABLET | Freq: Two times a day (BID) | ORAL | Status: DC

## 2015-05-07 NOTE — Telephone Encounter (Signed)
Pt needs cellcept rx called in to [place].  This has been done.  Nothing further needed.

## 2015-05-13 ENCOUNTER — Other Ambulatory Visit (INDEPENDENT_AMBULATORY_CARE_PROVIDER_SITE_OTHER): Payer: 59

## 2015-05-13 DIAGNOSIS — Z5181 Encounter for therapeutic drug level monitoring: Secondary | ICD-10-CM

## 2015-05-13 LAB — COMPREHENSIVE METABOLIC PANEL
ALT: 14 U/L (ref 0–35)
AST: 15 U/L (ref 0–37)
Albumin: 3.7 g/dL (ref 3.5–5.2)
Alkaline Phosphatase: 89 U/L (ref 39–117)
BUN: 18 mg/dL (ref 6–23)
CO2: 32 mEq/L (ref 19–32)
Calcium: 9.5 mg/dL (ref 8.4–10.5)
Chloride: 101 mEq/L (ref 96–112)
Creatinine, Ser: 1.04 mg/dL (ref 0.40–1.20)
GFR: 57.44 mL/min — ABNORMAL LOW (ref >60.00)
Glucose, Bld: 103 mg/dL — ABNORMAL HIGH (ref 70–99)
Potassium: 3.7 mEq/L (ref 3.5–5.1)
Sodium: 142 mEq/L (ref 135–145)
Total Bilirubin: 0.5 mg/dL (ref 0.2–1.2)
Total Protein: 6.9 g/dL (ref 6.0–8.3)

## 2015-05-13 LAB — CBC WITH DIFFERENTIAL/PLATELET
Basophils Absolute: 0 10*3/uL (ref 0.0–0.1)
Basophils Relative: 0.5 % (ref 0.0–3.0)
Eosinophils Absolute: 0.4 10*3/uL (ref 0.0–0.7)
Eosinophils Relative: 3.9 % (ref 0.0–5.0)
HCT: 40.9 % (ref 36.0–46.0)
Hemoglobin: 13.7 g/dL (ref 12.0–15.0)
Lymphocytes Relative: 12.1 % (ref 12.0–46.0)
Lymphs Abs: 1.2 10*3/uL (ref 0.7–4.0)
MCHC: 33.5 g/dL (ref 30.0–36.0)
MCV: 86.2 fl (ref 78.0–100.0)
Monocytes Absolute: 0.8 10*3/uL (ref 0.1–1.0)
Monocytes Relative: 8.7 % (ref 3.0–12.0)
Neutro Abs: 7.1 10*3/uL (ref 1.4–7.7)
Neutrophils Relative %: 74.8 % (ref 43.0–77.0)
Platelets: 254 10*3/uL (ref 150.0–400.0)
RBC: 4.74 Mil/uL (ref 3.87–5.11)
RDW: 13.3 % (ref 11.5–15.5)
WBC: 9.5 10*3/uL (ref 4.0–10.5)

## 2015-05-14 ENCOUNTER — Encounter: Payer: Self-pay | Admitting: *Deleted

## 2015-05-20 ENCOUNTER — Encounter: Payer: Self-pay | Admitting: *Deleted

## 2015-05-20 ENCOUNTER — Ambulatory Visit: Payer: Commercial Managed Care - HMO | Admitting: *Deleted

## 2015-05-20 ENCOUNTER — Encounter
Admission: RE | Disposition: A | Discharge status: Home / Self Care | Payer: Self-pay | Source: Ambulatory Visit | Attending: Ophthalmology

## 2015-05-20 ENCOUNTER — Ambulatory Visit
Admission: RE | Admit: 2015-05-20 | Discharge: 2015-05-20 | Disposition: A | Discharge status: Home / Self Care | Payer: Commercial Managed Care - HMO | Source: Ambulatory Visit | Attending: Ophthalmology | Admitting: Ophthalmology

## 2015-05-20 DIAGNOSIS — E079 Disorder of thyroid, unspecified: Secondary | ICD-10-CM | POA: Insufficient documentation

## 2015-05-20 DIAGNOSIS — D869 Sarcoidosis, unspecified: Secondary | ICD-10-CM | POA: Diagnosis not present

## 2015-05-20 DIAGNOSIS — Z9049 Acquired absence of other specified parts of digestive tract: Secondary | ICD-10-CM | POA: Diagnosis not present

## 2015-05-20 DIAGNOSIS — H9190 Unspecified hearing loss, unspecified ear: Secondary | ICD-10-CM | POA: Insufficient documentation

## 2015-05-20 DIAGNOSIS — Z7982 Long term (current) use of aspirin: Secondary | ICD-10-CM | POA: Diagnosis not present

## 2015-05-20 DIAGNOSIS — G4730 Sleep apnea, unspecified: Secondary | ICD-10-CM | POA: Diagnosis not present

## 2015-05-20 DIAGNOSIS — Z882 Allergy status to sulfonamides status: Secondary | ICD-10-CM | POA: Insufficient documentation

## 2015-05-20 DIAGNOSIS — Z888 Allergy status to other drugs, medicaments and biological substances status: Secondary | ICD-10-CM | POA: Insufficient documentation

## 2015-05-20 DIAGNOSIS — H33311 Horseshoe tear of retina without detachment, right eye: Secondary | ICD-10-CM | POA: Insufficient documentation

## 2015-05-20 DIAGNOSIS — Z7952 Long term (current) use of systemic steroids: Secondary | ICD-10-CM | POA: Insufficient documentation

## 2015-05-20 DIAGNOSIS — Z974 Presence of external hearing-aid: Secondary | ICD-10-CM | POA: Insufficient documentation

## 2015-05-20 DIAGNOSIS — J45909 Unspecified asthma, uncomplicated: Secondary | ICD-10-CM | POA: Diagnosis not present

## 2015-05-20 DIAGNOSIS — H4311 Vitreous hemorrhage, right eye: Secondary | ICD-10-CM | POA: Insufficient documentation

## 2015-05-20 DIAGNOSIS — Z79899 Other long term (current) drug therapy: Secondary | ICD-10-CM | POA: Insufficient documentation

## 2015-05-20 DIAGNOSIS — Z7951 Long term (current) use of inhaled steroids: Secondary | ICD-10-CM | POA: Diagnosis not present

## 2015-05-20 DIAGNOSIS — Z9981 Dependence on supplemental oxygen: Secondary | ICD-10-CM | POA: Diagnosis not present

## 2015-05-20 HISTORY — PX: PARS PLANA VITRECTOMY: SHX2166

## 2015-05-20 HISTORY — DX: Hypothyroidism, unspecified: E03.9

## 2015-05-20 HISTORY — DX: Cardiac arrhythmia, unspecified: I49.9

## 2015-05-20 HISTORY — DX: Irritable bowel syndrome, unspecified: K58.9

## 2015-05-20 HISTORY — DX: Other seasonal allergic rhinitis: J30.2

## 2015-05-20 HISTORY — DX: Unspecified asthma, uncomplicated: J45.909

## 2015-05-20 HISTORY — DX: Sarcoidosis, unspecified: D86.9

## 2015-05-20 HISTORY — DX: Unspecified hearing loss, unspecified ear: H91.90

## 2015-05-20 HISTORY — DX: Sleep apnea, unspecified: G47.30

## 2015-05-20 SURGERY — PARS PLANA VITRECTOMY WITH 25 GAUGE
Anesthesia: Monitor Anesthesia Care | Site: Eye | Laterality: Right | Wound class: Clean

## 2015-05-20 MED ORDER — CEFUROXIME OPHTHALMIC INJECTION 1 MG/0.1 ML
INJECTION | OPHTHALMIC | Status: DC | PRN
  Administered 2015-05-20: 1 mg via INTRACAMERAL

## 2015-05-20 MED ORDER — BSS PLUS IO SOLN
Freq: Once | INTRAOCULAR | Status: DC
  Filled 2015-05-20: qty 500

## 2015-05-20 MED ORDER — TETRACAINE HCL 0.5 % OP SOLN
OPHTHALMIC | Status: DC | PRN
  Administered 2015-05-20: 2 [drp] via OPHTHALMIC

## 2015-05-20 MED ORDER — SODIUM CHLORIDE 0.9 % IV SOLN
INTRAVENOUS | Status: DC | PRN
  Administered 2015-05-20: 11:00:00 via INTRAVENOUS

## 2015-05-20 MED ORDER — SODIUM CHLORIDE 0.9 % IV SOLN
INTRAVENOUS | Status: DC
  Administered 2015-05-20: 50 mL/h via INTRAVENOUS

## 2015-05-20 MED ORDER — NEOMYCIN-POLYMYXIN-DEXAMETH 3.5-10000-0.1 OP OINT
TOPICAL_OINTMENT | OPHTHALMIC | Status: AC
  Filled 2015-05-20: qty 3.5

## 2015-05-20 MED ORDER — DEXAMETHASONE SODIUM PHOSPHATE 10 MG/ML IJ SOLN
INTRAMUSCULAR | Status: DC | PRN
  Administered 2015-05-20: 10 mg

## 2015-05-20 MED ORDER — TETRACAINE HCL 0.5 % OP SOLN
OPHTHALMIC | Status: AC
  Filled 2015-05-20: qty 2

## 2015-05-20 MED ORDER — BSS PLUS IO SOLN
INTRAOCULAR | Status: DC | PRN
  Administered 2015-05-20: 1 via INTRAOCULAR

## 2015-05-20 MED ORDER — CEFUROXIME OPHTHALMIC INJECTION 1 MG/0.1 ML
INJECTION | OPHTHALMIC | Status: AC
  Filled 2015-05-20: qty 0.1

## 2015-05-20 MED ORDER — HYALURONIDASE HUMAN 150 UNIT/ML IJ SOLN
INTRAMUSCULAR | Status: AC
  Filled 2015-05-20: qty 1

## 2015-05-20 MED ORDER — BUPIVACAINE HCL (PF) 0.75 % IJ SOLN
INTRAMUSCULAR | Status: AC
  Filled 2015-05-20: qty 10

## 2015-05-20 MED ORDER — MIDAZOLAM HCL 2 MG/2ML IJ SOLN
INTRAMUSCULAR | Status: DC | PRN
  Administered 2015-05-20: 1 mg via INTRAVENOUS

## 2015-05-20 MED ORDER — ATROPINE SULFATE 1 % OP SOLN
OPHTHALMIC | Status: DC | PRN
  Administered 2015-05-20: 2 [drp] via OPHTHALMIC

## 2015-05-20 MED ORDER — PHENYLEPHRINE HCL 10 % OP SOLN
OPHTHALMIC | Status: AC
  Administered 2015-05-20: 1 [drp] via OPHTHALMIC
  Filled 2015-05-20: qty 5

## 2015-05-20 MED ORDER — LIDOCAINE HCL (PF) 4 % IJ SOLN
INTRAMUSCULAR | Status: AC
  Filled 2015-05-20: qty 5

## 2015-05-20 MED ORDER — ALFENTANIL 500 MCG/ML IJ INJ
INJECTION | INTRAMUSCULAR | Status: DC | PRN
  Administered 2015-05-20 (×2): 500 ug via INTRAVENOUS

## 2015-05-20 MED ORDER — PHENYLEPHRINE HCL 10 % OP SOLN
1.0000 [drp] | OPHTHALMIC | Status: DC | PRN
  Administered 2015-05-20 (×3): 1 [drp] via OPHTHALMIC

## 2015-05-20 MED ORDER — CYCLOPENTOLATE HCL 2 % OP SOLN
OPHTHALMIC | Status: AC
  Administered 2015-05-20: 1 [drp] via OPHTHALMIC
  Filled 2015-05-20: qty 2

## 2015-05-20 MED ORDER — CYCLOPENTOLATE HCL 2 % OP SOLN
1.0000 [drp] | OPHTHALMIC | Status: DC | PRN
  Administered 2015-05-20 (×3): 1 [drp] via OPHTHALMIC

## 2015-05-20 MED ORDER — ATROPINE SULFATE 1 % OP SOLN
OPHTHALMIC | Status: AC
  Filled 2015-05-20: qty 5

## 2015-05-20 MED ORDER — HYPROMELLOSE 0.3 % OP GEL
OPHTHALMIC | Status: DC | PRN
  Administered 2015-05-20: 1 via OPHTHALMIC

## 2015-05-20 MED ORDER — NEOMYCIN-POLYMYXIN-DEXAMETH 0.1 % OP OINT
TOPICAL_OINTMENT | OPHTHALMIC | Status: DC | PRN
  Administered 2015-05-20: 1 via OPHTHALMIC

## 2015-05-20 MED ORDER — PROPOFOL 10 MG/ML IV BOLUS
INTRAVENOUS | Status: DC | PRN
  Administered 2015-05-20: 20 mg via INTRAVENOUS

## 2015-05-20 MED ORDER — HYPROMELLOSE 0.3 % OP GEL
OPHTHALMIC | Status: AC
  Filled 2015-05-20: qty 3.5

## 2015-05-20 MED ORDER — DEXAMETHASONE SODIUM PHOSPHATE 10 MG/ML IJ SOLN
INTRAMUSCULAR | Status: AC
  Filled 2015-05-20: qty 1

## 2015-05-20 MED ORDER — LIDOCAINE HCL (PF) 4 % IJ SOLN
INTRAMUSCULAR | Status: DC | PRN
  Administered 2015-05-20: 5 mL via OPHTHALMIC

## 2015-05-20 SURGICAL SUPPLY — 27 items
APPLICATOR COTTON TIP 6IN STRL (MISCELLANEOUS) ×8 IMPLANT
CANNULA SOFT TIP 25G (CANNULA) IMPLANT
CORD BIP STRL DISP 12FT (MISCELLANEOUS) ×2 IMPLANT
CUP MEDICINE 2OZ PLAST GRAD ST (MISCELLANEOUS) ×2 IMPLANT
ERASER HMR WETFIELD 25G (MISCELLANEOUS) ×2 IMPLANT
FORCEPS GRIESH GRASP 25G (INSTRUMENTS) IMPLANT
FORCEPS GRIESH ILM PLUS 25G (INSTRUMENTS) IMPLANT
GLOVE BIO SURGEON STRL SZ8 (GLOVE) ×2 IMPLANT
GLOVE SURG LX 6.5 MICRO (GLOVE) ×1
GLOVE SURG LX STRL 6.5 MICRO (GLOVE) ×1 IMPLANT
GOWN STRL REUS W/ TWL LRG LVL3 (GOWN DISPOSABLE) ×2 IMPLANT
GOWN STRL REUS W/TWL LRG LVL3 (GOWN DISPOSABLE) ×2
LENS BIOM OPTIC SET 200MM DISP (MISCELLANEOUS) ×2 IMPLANT
LENS VITRECTOMY FLAT DISP (MISCELLANEOUS) IMPLANT
NDL RETROBULBAR .5 NSTRL (NEEDLE) ×2 IMPLANT
NEEDLE FILTER BLUNT 18X 1/2SAF (NEEDLE) ×1
NEEDLE FILTER BLUNT 18X1 1/2 (NEEDLE) ×1 IMPLANT
PACK EYE AFTER SURG (MISCELLANEOUS) ×2 IMPLANT
PACK VITRECTOMY (MISCELLANEOUS) ×2 IMPLANT
PACK VITRECTOMY CASSETTE 25GA (MISCELLANEOUS) ×2 IMPLANT
PROBE LASER ILLUM FLEX CVD 25G (OPHTHALMIC) ×2 IMPLANT
SOL PREP PVP 2OZ (MISCELLANEOUS) ×2
SOLUTION PREP PVP 2OZ (MISCELLANEOUS) ×1 IMPLANT
STRAP SAFETY BODY (MISCELLANEOUS) ×2 IMPLANT
SYR 3ML LL SCALE MARK (SYRINGE) IMPLANT
SYR TB 1ML LUER SLIP (SYRINGE) IMPLANT
SYRINGE 10CC LL (SYRINGE) ×2 IMPLANT

## 2015-05-20 NOTE — Discharge Instructions (Signed)
AMBULATORY SURGERY  DISCHARGE INSTRUCTIONS   1) The drugs that you were given will stay in your system until tomorrow so for the next 24 hours you should not:  A) Drive an automobile B) Make any legal decisions C) Drink any alcoholic beverage   2) You may resume regular meals tomorrow.  Today it is better to start with liquids and gradually work up to solid foods.  You may eat anything you prefer, but it is better to start with liquids, then soup and crackers, and gradually work up to solid foods.   3) Please notify your doctor immediately if you have any unusual bleeding, trouble breathing, redness and pain at the surgery site, drainage, fever, or pain not relieved by medication.    4) Additional Instructions:    Eye Surgery Discharge Instructions  Expect mild scratchy sensation or mild soreness. DO NOT RUB YOUR EYE!  The day of surgery:  Minimal physical activity, but bed rest is not required  No reading, computer work, or close hand work  No bending, lifting, or straining.  May watch TV  For 24 hours:  No driving, legal decisions, or alcoholic beverages  Safety precautions  Eat anything you prefer: It is better to start with liquids, then soup then solid foods.  _____ Eye patch should be worn until postoperative exam tomorrow.  ____ Solar shield eyeglasses should be worn for comfort in the sunlight/patch while sleeping  Resume all regular medications including aspirin or Coumadin if these were discontinued prior to surgery. You may shower, bathe, shave, or wash your hair. Tylenol may be taken for mild discomfort.  Call your doctor if you experience significant pain, nausea, or vomiting, fever > 101 or other signs of infection. [phone] or [phone] Specific instructions:      Please contact your physician with any problems or Same Day Surgery at [phone], Monday through Friday 6 am to 4 pm, or [place] at [facility] number at  [phone].

## 2015-05-20 NOTE — Transfer of Care (Signed)
Immediate Anesthesia Transfer of Care Note  Patient: Stacie [name]  Procedure(s) Performed: Procedure(s): PARS PLANA VITRECTOMY WITH 25 GAUGE, laser (Right)  Patient Location: PACU  Anesthesia Type:MAC  Level of Consciousness: awake, alert  and oriented  Airway & Oxygen Therapy: Patient Spontanous Breathing  Post-op Assessment: Report given to RN  Post vital signs: stable  Last Vitals:  Filed Vitals:   05/20/15 0854  BP: 131/72  Pulse: 105  Temp: 36.9 C  Resp: 16    Complications: No apparent anesthesia complications

## 2015-05-20 NOTE — Anesthesia Preprocedure Evaluation (Signed)
Anesthesia Evaluation  Patient identified by MRN, date of birth, ID band Patient awake    Reviewed: Allergy & Precautions, NPO status , Patient's Chart, lab work & pertinent test results  Airway Mallampati: I  TM Distance: >3 FB Neck ROM: Full    Dental  (+) Teeth Intact   Pulmonary asthma , sleep apnea, Continuous Positive Airway Pressure Ventilation and Oxygen sleep apnea ,  Pulmonary sarcoid--on prednisone 5 mg every other day.   Pulmonary exam normal       Cardiovascular Exercise Tolerance: Good negative cardio ROS Normal cardiovascular exam    Neuro/Psych    GI/Hepatic   Endo/Other    Renal/GU      Musculoskeletal   Abdominal Normal abdominal exam  (+)   Peds  Hematology   Anesthesia Other Findings   Reproductive/Obstetrics                             Anesthesia Physical Anesthesia Plan  ASA: III  Anesthesia Plan: MAC   Post-op Pain Management:    Induction: Intravenous  Airway Management Planned: Nasal Cannula  Additional Equipment:   Intra-op Plan:   Post-operative Plan:   Informed Consent: I have reviewed the patients History and Physical, chart, labs and discussed the procedure including the risks, benefits and alternatives for the proposed anesthesia with the patient or authorized representative who has indicated his/her understanding and acceptance.     Plan Discussed with: CRNA  Anesthesia Plan Comments:         Anesthesia Quick Evaluation

## 2015-05-20 NOTE — Anesthesia Postprocedure Evaluation (Signed)
  Anesthesia Post-op Note  Patient: Stacie [name]  Procedure(s) Performed: Procedure(s): PARS PLANA VITRECTOMY WITH 25 GAUGE, laser (Right)  Anesthesia type:MAC  Patient location: PACU  Post pain: Pain level controlled  Post assessment: Post-op Vital signs reviewed, Patient's Cardiovascular Status Stable, Respiratory Function Stable, Patent Airway and No signs of Nausea or vomiting  Post vital signs: Reviewed and stable  Last Vitals:  Filed Vitals:   05/20/15 0854  BP: 131/72  Pulse: 105  Temp: 36.9 C  Resp: 16    Level of consciousness: awake, alert  and patient cooperative  Complications: No apparent anesthesia complications

## 2015-05-20 NOTE — H&P (Signed)
.  Previous H&P scanned in reviewed, patient examined, and no interval changes.  Please see scanned record for complete information.

## 2015-05-20 NOTE — Op Note (Signed)
INDICATIONS & JUSTIFICATIONS FOR SURGERY: The patient was examined in the clinic for a nonclearing vitreous hemorrhage in the right eye. After a period of observation the decision was made and discussed with the patient to proceed with pars plana vitrectomy with laser. The risks, benefits, alternatives, and complications were discussed with the patient, and she elected to proceed with pars plana vitrectomy.    PREOPERATIVE DIAGNOSIS: Vitreous hemorrhage, right eye, retinal tear right eye      POST OPERATIVE DIAGNOSIS: Vitreous hemorrhage, right eye, retinal tear right eye OPERATION PERFORMED: 25-gauge pars plana vitrectomy, endolaser right eye.                    ANESTHESIA: MAC with retrobulbar block.   COMPLICATIONS: None.     BLOOD LOSS: Minimal.   SPECIMEN: None.   DESCRIPTION OF PROCEDURE: On the day of surgery, the patient was greeted in the preoperative holding area.  All questions were answered. The right eye was marked. The patient was then taken into the operating room in the supine position.   Next, 5 ml of a retrobulbar block consisting of 4% xylocaine plain, 0.75% sensorcaine plain and hylenex 100u was injected into the right orbit.The right eye was then prepped and draped in the usual sterile fashion.  Next, 25-gauge trocars were placed in the usual position. The infusion cannula was checked to make sure it was in the vitreous cavity despite the blood in the vitreous.  A core vitrectomy was performed.  The patient had a PVD, so the after the core gel was removed the vitreous was trimmed 360 degrees to the periphery. The periphery was carefully inspected for 360 degrees with scleral depression, there were no new retinal tears or breaks noted. The prior break was surrounded with laser. The flap was trimmed using the vitrector and additional laser reinforcement was added. The trocars were then removed and sutured with 7.0 vicryl to ensure they were watertight.  Subconjunctival cefuroxime and  dexamethasone were injected.  The eye was then patched and shielded with atropine and neo-poly-dex.  The patient was taken to the recovery area in stable condition.

## 2015-05-27 ENCOUNTER — Other Ambulatory Visit (INDEPENDENT_AMBULATORY_CARE_PROVIDER_SITE_OTHER): Payer: 59

## 2015-05-27 DIAGNOSIS — Z5181 Encounter for therapeutic drug level monitoring: Secondary | ICD-10-CM

## 2015-05-27 LAB — CBC WITH DIFFERENTIAL/PLATELET
Basophils Absolute: 0 10*3/uL (ref 0.0–0.1)
Basophils Relative: 0.5 % (ref 0.0–3.0)
Eosinophils Absolute: 0.2 10*3/uL (ref 0.0–0.7)
Eosinophils Relative: 3.2 % (ref 0.0–5.0)
HCT: 41.6 % (ref 36.0–46.0)
Hemoglobin: 13.9 g/dL (ref 12.0–15.0)
Lymphocytes Relative: 15.6 % (ref 12.0–46.0)
Lymphs Abs: 1.1 10*3/uL (ref 0.7–4.0)
MCHC: 33.4 g/dL (ref 30.0–36.0)
MCV: 85.8 fl (ref 78.0–100.0)
Monocytes Absolute: 0.8 10*3/uL (ref 0.1–1.0)
Monocytes Relative: 10.7 % (ref 3.0–12.0)
Neutro Abs: 5.2 10*3/uL (ref 1.4–7.7)
Neutrophils Relative %: 70 % (ref 43.0–77.0)
Platelets: 291 10*3/uL (ref 150.0–400.0)
RBC: 4.84 Mil/uL (ref 3.87–5.11)
RDW: 13.8 % (ref 11.5–15.5)
WBC: 7.4 10*3/uL (ref 4.0–10.5)

## 2015-05-27 LAB — COMPREHENSIVE METABOLIC PANEL
ALT: 12 U/L (ref 0–35)
AST: 17 U/L (ref 0–37)
Albumin: 3.8 g/dL (ref 3.5–5.2)
Alkaline Phosphatase: 93 U/L (ref 39–117)
BUN: 19 mg/dL (ref 6–23)
CO2: 31 mEq/L (ref 19–32)
Calcium: 10 mg/dL (ref 8.4–10.5)
Chloride: 102 mEq/L (ref 96–112)
Creatinine, Ser: 1.1 mg/dL (ref 0.40–1.20)
GFR: 53.83 mL/min — ABNORMAL LOW (ref >60.00)
Glucose, Bld: 99 mg/dL (ref 70–99)
Potassium: 3.8 mEq/L (ref 3.5–5.1)
Sodium: 141 mEq/L (ref 135–145)
Total Bilirubin: 0.4 mg/dL (ref 0.2–1.2)
Total Protein: 7.5 g/dL (ref 6.0–8.3)

## 2015-06-02 ENCOUNTER — Ambulatory Visit (INDEPENDENT_AMBULATORY_CARE_PROVIDER_SITE_OTHER): Payer: 59 | Admitting: Pulmonary Disease

## 2015-06-02 ENCOUNTER — Encounter: Payer: Self-pay | Admitting: Pulmonary Disease

## 2015-06-02 VITALS — BP 142/70 | HR 90 | Ht 68.0 in | Wt 187.0 lb

## 2015-06-02 DIAGNOSIS — D860 Sarcoidosis of lung: Secondary | ICD-10-CM

## 2015-06-02 DIAGNOSIS — J4540 Moderate persistent asthma, uncomplicated: Secondary | ICD-10-CM | POA: Diagnosis not present

## 2015-06-02 MED ORDER — HYDROCODONE-HOMATROPINE 5-1.5 MG/5ML PO SYRP: 5.0000 mL | ORAL_SOLUTION | Freq: Four times a day (QID) | ORAL | Status: DC | PRN

## 2015-06-02 MED ORDER — PREDNISONE 20 MG PO TABS: 20.0000 mg | ORAL_TABLET | Freq: Every day | ORAL | Status: DC

## 2015-06-02 NOTE — Assessment & Plan Note (Signed)
She has done well with CellCept. However, she has had 2 colds this summer which are still bothering her. She has some wheezing on exam today which I think is a result of her asthma in the recent upper respiratory infection.  Plan: Prednisone 5 days for the wheezing Continue CellCept with every 3 month monitoring LFTs Repeat pulmonary function testing in 3 months when she comes back, that will be about 5 months after she started taking the CellCept

## 2015-06-02 NOTE — Patient Instructions (Signed)
Take the Hycodan as needed for cough Take prednisone for 5 days Keep taking the CellCept as you're doing We will arrange a lung function test in 3 months We will see you back in 3 months

## 2015-06-02 NOTE — Progress Notes (Signed)
Subjective:    Patient ID: Stacie [name], female    DOB: 05-03-1956, 59 y.o.   MRN: [id number]   Synopsis: Stacie [name] first saw the [facility] pulmonary clinic in May 2014 for evaluation of sarcoidosis. She was diagnosed with sarcoid in 2009 at the [facility] via an EBUS guided fine needle aspiration of a hilar lymph node as well as an endobronchial biopsy which showed noncaseating granulomas with negative special stains for infectious organisms. She was treated off and on with steroids from 2009 2 2014 and then in 2014 was started on methotrexate in addition to Plaquenil. She also has chronic kidney disease which is believed to be directly related to sarcoidosis.  She had no improvement with methotrexate and plaquenil and her TLC and DLCO declined slightly so in January 2015 she was started on imuran but this had to be held because of increasing LFTs. She ended up seeing Dr. Lenise Arena at [facility] in 01/2014 for her sarcoid.  He felt like her symptoms were more due to Asthma than Sarcoid.  Plaquenil was restarted, prednisone tapered, and Advair changed to QVar. In 2016 she was admitted for community-acquired pneumonia and had in general worsening respiratory symptoms. She improved, but lung function testing had shown a fairly progressive decline in DLCO.   Despite treatment for the pneumonia, she noted severe worsening dyspnea over several months.  CellCept was started in June 2016.  HPI Chief Complaint  Patient presents with  . Follow-up    pt tolerating Cellcept well.  notes catching a couple of summer colds but doing well overall.     Stacie [name] has been doing well since the last visit.  She has been traveling and feeling OK. She has had two colds this summer which are making her symptoms (cough) worse.  However she has been coughing worse at night.  She had sinusitis about a month ago which was treated with prednisone. She says that the cough has hung on  for over a month. She has chertussin at home which doesn't help that much.  She doesn't like the way she feels.   No fever or chills right now.  Still producing milky colored mucus.   She has been doing better lately though, fatigue and dyspnea have improved overall compared to the wintertime. She still has dyspnea when carrying weight up stairs (grandbaby 15 pounds). Se had eye surgery recently, vision back to normal.    Past Medical History  Diagnosis Date  . Sarcoid   . Asthma   . Sleep apnea CPAP with O2  . Sarcoidosis   . IBS (irritable bowel syndrome)   . Seasonal allergies   . HOH (hard of hearing)     wear aids  . Hypothyroidism   . Arrhythmia     patient unaware if this is current     Review of Systems  Constitutional: Negative for fever, chills and fatigue.  HENT: Negative for congestion, nosebleeds, postnasal drip, rhinorrhea and sinus pressure.   Respiratory: Positive for shortness of breath. Negative for cough and wheezing.   Cardiovascular: Negative for chest pain, palpitations and leg swelling.       Objective:   Physical Exam Filed Vitals:   06/02/15 0958  BP: 142/70  Pulse: 90  Height: _0  (1.727 m)  Weight: 187 lb (84.823 kg)  SpO2: 94%  RA  Gen: well appearing, no acute distress HEENT: NCAT, EOMi, OP clear without thrush PULM: No crackles, has wheezing left lung,  normal effort CV: RRR, no mgr, no JVD AB: BS+, soft, nontender Ext: warm, no edema, no clubbing, no cyanosis  Diagnosed 2009 [facility] with EBUS FNA of a hilar lymph node as well as an endobronchial lesion showing noncaseating granulomas, special stains were negative for fungal or AFB Prednisone off and on 2003-2014 11/2012 MTX/HCQ started [facility] Kidney involvement 03/12/2013 6 MW > 1500 feet, O2 saturation nadir 88% RA, HR max 132 03/2013 PFT > Ratio 66%, FEV 2.2L (82% pred), TLC 4.62 L (81% pred), DLCO 16.7 68% 09/2013 PFT > ratio 63%, FEV1 1.99> 2.08L with bronchodilator (76% predicted, 5%),  TLC 4.25L (72% pred), DLCO 15.5 (64% pred) 10/2013 imuran started 09/2013 TPMT activity 19 > normal 10/2013 Stop MTX, start Imuran> goal dose is 142m/day (renal adjustment); plan start 526mdaily, increase 2542mvery 3 weeks; weekly CBC/CMET until 125m7my, then q-12 weeks;  11/2013 had to stop imuran due to LFT abnormalities on two tries 02/24/2014 CXR> improved bilateral airspace disease, upper lobe scarring noted 02/24/2014 PFT> Ratio FEV1 1.99L (75% pred, 12% change with BD), TLC 4.04 L (71% pred), DLCO 16.9 (71% pred) 06/2014 Echo> LVEF 50-55%, normal global LVF funtion, mild MR, estimated PA systolic 33.815.0ld TR, RV size normal 06/2014 PFT> ratio 55%, FEV1 1.39L (51% pred), FVC 2.52L (69%pred), TLCO 4.41L (75% pred), DLCO 64% pred 10/2014 PFT> Ratio 64% FEV1 1.52L (57% pred), FVC 2.39L (67% pred), TLC 3.58 (63% pred), DLCO 12.7 (53% pred) 10/2014 6MW> [id number] 83% RA     Assessment & Plan:   Pulmonary sarcoidosis She has done well with CellCept. However, she has had 2 colds this summer which are still bothering her. She has some wheezing on exam today which I think is a result of her asthma in the recent upper respiratory infection.  Plan: Prednisone 5 days for the wheezing Continue CellCept with every 3 month monitoring LFTs Repeat pulmonary function testing in 3 months when she comes back, that will be about 5 months after she started taking the CellCept  Asthma, chronic She has a mild flare right now associated with worsening coughing and wheezing.  Plan: Use Hycodan as needed for cough Prednisone 5 days don't take any other cough syrups other than the Hycodan      Current outpatient prescriptions:  .  albuterol (VENTOLIN HFA) 108 (90 BASE) MCG/ACT inhaler, Inhale 2 puffs into the lungs every 6 (six) hours as needed for wheezing., Disp: , Rfl:  .  carboxymethylcellulose (REFRESH PLUS) 0.5 % SOLN, 1 drop 3 (three) times daily as needed., Disp: , Rfl:  .   fluticasone-salmeterol (ADVAIR HFA) 230-21 MCG/ACT inhaler, Inhale 2 puffs into the lungs 2 (two) times daily., Disp: 1 Inhaler, Rfl: 12 .  GLUCOSAMINE PO, Take 1 tablet by mouth daily., Disp: , Rfl:  .  ipratropium (ATROVENT) 0.06 % nasal spray, Place 2 sprays into both nostrils 4 (four) times daily., Disp: , Rfl:  .  levothyroxine (SYNTHROID, LEVOTHROID) 137 MCG tablet, Take 1 tablet (137 mcg total) by mouth daily before breakfast., Disp: 90 tablet, Rfl: 3 .  mometasone (NASONEX) 50 MCG/ACT nasal spray, Place 1 spray into the nose 2 (two) times daily., Disp: , Rfl:  .  Multiple Vitamin (MULTIVITAMIN) capsule, Take 1 capsule by mouth 2 (two) times daily. , Disp: , Rfl:  .  mycophenolate (CELLCEPT) 500 MG tablet, Take 2 tablets (1,000 mg total) by mouth 2 (two) times daily., Disp: 360 tablet, Rfl: 1 .  omeprazole (PRILOSEC) 40 MG capsule, Take 40 mg  by mouth daily., Disp: , Rfl:  .  PARoxetine (PAXIL) 10 MG tablet, Take 1 tablet (10 mg total) by mouth at bedtime., Disp: 30 tablet, Rfl: 5 .  ranitidine (ZANTAC) 150 MG tablet, Take 150 mg by mouth at bedtime., Disp: , Rfl:  .  aspirin 81 MG tablet, Take 81 mg by mouth daily., Disp: , Rfl:  .  HYDROcodone-homatropine (HYCODAN) 5-1.5 MG/5ML syrup, Take 5 mLs by mouth every 6 (six) hours as needed for cough., Disp: 240 mL, Rfl: 0 .  predniSONE (DELTASONE) 20 MG tablet, Take 1 tablet (20 mg total) by mouth daily with breakfast., Disp: 5 tablet, Rfl: 0

## 2015-06-02 NOTE — Assessment & Plan Note (Signed)
She has a mild flare right now associated with worsening coughing and wheezing.  Plan: Use Hycodan as needed for cough Prednisone 5 days don't take any other cough syrups other than the Hycodan

## 2015-06-03 ENCOUNTER — Other Ambulatory Visit: Payer: 59

## 2015-06-04 ENCOUNTER — Other Ambulatory Visit: Payer: Self-pay

## 2015-06-04 MED ORDER — PAROXETINE HCL 10 MG PO TABS: 10.0000 mg | ORAL_TABLET | Freq: Every day | ORAL | Status: DC

## 2015-06-18 ENCOUNTER — Telehealth: Payer: Self-pay | Admitting: Pulmonary Disease

## 2015-06-18 DIAGNOSIS — G4733 Obstructive sleep apnea (adult) (pediatric): Secondary | ICD-10-CM

## 2015-06-18 NOTE — Telephone Encounter (Signed)
Spoke with pt. She reports she is needing a travel concentrator since she is going on vacation. She uses O2 blended into CPAP at night and her concentrator is too large to take. Order placed to [facility] as per Caryl Pina this would be fine to do. Nothing further needed

## 2015-08-26 ENCOUNTER — Ambulatory Visit
Admission: RE | Admit: 2015-08-26 | Discharge: 2015-08-26 | Disposition: A | Discharge status: Home / Self Care | Payer: 59 | Source: Ambulatory Visit | Attending: Unknown Physician Specialty | Admitting: Unknown Physician Specialty

## 2015-08-26 ENCOUNTER — Other Ambulatory Visit: Payer: Self-pay | Admitting: Unknown Physician Specialty

## 2015-08-26 DIAGNOSIS — J40 Bronchitis, not specified as acute or chronic: Secondary | ICD-10-CM

## 2015-08-26 DIAGNOSIS — R059 Cough, unspecified: Secondary | ICD-10-CM

## 2015-08-26 DIAGNOSIS — R05 Cough: Secondary | ICD-10-CM | POA: Diagnosis not present

## 2015-08-26 DIAGNOSIS — D869 Sarcoidosis, unspecified: Secondary | ICD-10-CM | POA: Diagnosis not present

## 2015-09-16 ENCOUNTER — Telehealth: Payer: Self-pay

## 2015-09-16 NOTE — Telephone Encounter (Signed)
Pt is scheduled for a PFT on 10/20/15 in [place], following up with Ramachandran on 10/27/15 per pt-per ov note pt wishes to stay in [place] location.    Does this appt need to be moved up?

## 2015-09-16 NOTE — Telephone Encounter (Signed)
-----  Message from Juanito Doom, MD sent at 09/16/2015  3:42 PM EST ----- A, I received a note that she has been having some trouble with cough recently.  Can you check to see if she has an appointment with me anytime soon?  If so, I'd like her to have a CXR the day of the visit before she sees me. Thanks B

## 2015-09-17 NOTE — Telephone Encounter (Signed)
That plan sounds great

## 2015-10-23 ENCOUNTER — Ambulatory Visit: Payer: 59 | Admitting: Pulmonary Disease

## 2015-10-27 ENCOUNTER — Encounter: Payer: 59 | Admitting: Internal Medicine

## 2015-10-29 NOTE — Progress Notes (Signed)
  This encounter was created in error - please disregard.

## 2015-11-10 ENCOUNTER — Encounter: Payer: Self-pay | Admitting: Internal Medicine

## 2015-11-10 ENCOUNTER — Ambulatory Visit (INDEPENDENT_AMBULATORY_CARE_PROVIDER_SITE_OTHER): Payer: 59 | Admitting: Internal Medicine

## 2015-11-10 VITALS — BP 128/72 | HR 114 | Ht 68.0 in | Wt 183.4 lb

## 2015-11-10 DIAGNOSIS — G4733 Obstructive sleep apnea (adult) (pediatric): Secondary | ICD-10-CM | POA: Diagnosis not present

## 2015-11-10 DIAGNOSIS — Z23 Encounter for immunization: Secondary | ICD-10-CM | POA: Diagnosis not present

## 2015-11-10 DIAGNOSIS — D869 Sarcoidosis, unspecified: Secondary | ICD-10-CM

## 2015-11-10 MED ORDER — FLUTICASONE FUROATE-VILANTEROL 200-25 MCG/INH IN AEPB: 1.0000 | INHALATION_SPRAY | Freq: Every day | RESPIRATORY_TRACT | Status: AC

## 2015-11-10 NOTE — Patient Instructions (Addendum)
--  Start exercising 30 minutes 4 days per week.  --Use your albuterol inhaler when you have trouble breathing.   Check an IgE level and ACE level.  Check calcium levels.   PFT and 6 minute walk in 3-4 months and follow up after that.

## 2015-11-10 NOTE — Progress Notes (Addendum)
* [facility]    ADDENDUM 04/06/15:  Spoke with patient regarding her recent test results. She appears to have had a slow decline in her pulmonary function and functional status over the past year, therefore we will started her on prednisone and methotrexate and stop cellcept.  Before initiating the medication will check a baseline CMP, CXR.  PLAN:  --Stop cellcept.  --Check CMP, 2 view CXR (re: CKD, sarcoidosis).   After blood draw and CXR may start: --Prednisone 30 mg daily for one month, then maintain on 20 mg daily until follow up with me in august.  --Methotrexate 2.5 mg tab once weekly, increase by 1 tab (2.5 mg) weekly until taking 4 tabs weekly and continue at that dose   Assessment and Plan:  Pulmonary sarcoidosis. -She continues to have symptoms of dyspnea and cough on exertion, she feels that these have progressed over the last 2 years, and is wondering if she did better on methotrexate, then on the current medication (CellCept). -We will check a 6 minute walk test and pulmonary function test to see if her numbers are stable from a year ago if not, we will consider changing over to methotrexate. -We'll check an Ace level, and calcium level today. -She does have a yearly eye check for sarcoidosis.  Asthma. -Continued cough and dyspnea with exertion. We will check a IgE and CBC with differential.  Obstructive sleep apnea. -Appears to be doing well with her current CPAP. -Continue CPAP with 2 L of oxygen.  Chronic respiratory failure. -Continued chronic respiratory failure, oxygen dependent at night. -Continue oxygen at night, continue to monitor. Rest her status.  Chronic kidney disease. -This can contribute to dyspnea, therefore, we'll need to continue monitoring her kidney status.  Date: 11/10/2015  MRN# [id number] Stacie [name] October 02, 61 1957   Stacie [name] is a 60 y.o. old female seen in follow up for chief complaint of  Chief Complaint    Patient presents with  . PULMONARY CONSULT    former BQ pt. seen for sarcoidosis. pt. states breathing has worsen. increased SOB. prod. cough white in color. wheezing @ bedtime. denies chest pain/tightness. pt. wears CPAP 6-8hr everynight. feels pressure is good. no supplies needed. DME:[facility]     HPI:   The patient (pronounced Stacie [name])  is a 60 year old female with a history of asthma, sarcoidosis, chronic kidney disease and obstructive sleep apnea. She had continued symptoms on methotrexate and Plaquenil in the past, she also developed increasing LFTs. She was started on CellCept in June 2016.  Since her last  Visit she feels that her breathing has been stable. She continues to have coughing spells, particularly with activity. She notes that her breathing has declined from about a year ago. She notes that since being on CellCept she feels that she has been going through a decline.  She can do a flight of stairs and then sits for a minute.  She can walk a grocery store but slowly. When she get back to her car she will be tired and will have some coughing. She also notes that carrying something like groceries does the same thing.   She is currently on advair bid, she has a rescue inhaler but does not use it. She is not sure that it helps.   She is using her CPAP every night, she is not sleepy during the day. She uses oxygen during CPAP at 2L.   PFT 10/28/14; FVC 63%, FEV1 53%, TLC 63%; DLCO 53%.  High res CT chest. 02/20/2015: Compared with the previous from February. There is minimal change, possibly slight improvement. Review of chest x-ray images and 08/26/2015, in comparison with previous years. This shows progressive increase in the bilateral interstitial changes  Synopsis: [name] first saw the [facility] in May 2014 for evaluation of sarcoidosis. She was diagnosed with sarcoid in 2009 at the [place] via an EBUS guided fine  needle aspiration of a hilar lymph node as well as an endobronchial biopsy which showed noncaseating granulomas with negative special stains for infectious organisms. She was treated off and on with steroids from 2009 2 2014 and then in 2014 was started on methotrexate in addition to Plaquenil. She also has chronic kidney disease which is believed to be directly related to sarcoidosis. She had no improvement with methotrexate and plaquenil and her TLC and DLCO declined slightly so in January 2015 she was started on imuran but this had to be held because of increasing LFTs. She ended up seeing Dr. Lenise Arena at [facility] in 01/2014 for her sarcoid. He felt like her symptoms were more due to Asthma than Sarcoid. Plaquenil was restarted, prednisone tapered, and Advair changed to QVar. In 2016 she was admitted for community-acquired pneumonia and had in general worsening respiratory symptoms. She improved, but lung function testing had shown a fairly progressive decline in DLCO. Despite treatment for the pneumonia, she noted severe worsening dyspnea over several months. CellCept was started in June 2016.   SIX MIN WALK 10/21/2014 06/03/2014 03/12/2013  Supplimental Oxygen during Test? (L/min) No No No  Tech Comments: - Pt walked a moderately fast pace.  Pt was SOB during lap 2 and 3, but tolerated walk well. After walking for 6 minutes the pt walked 1500 ft.  Her o2 sat after 3 mintutes was 88%ra and pulse 132, after the second 3 minutes sat was 96%ra and pulse 132//lmr    Medication:   Outpatient Encounter Prescriptions as of 11/10/2015  Medication Sig  . albuterol (VENTOLIN HFA) 108 (90 BASE) MCG/ACT inhaler Inhale 2 puffs into the lungs every 6 (six) hours as needed for wheezing.  Marland Kitchen aspirin 81 MG tablet Take 81 mg by mouth daily.  . carboxymethylcellulose (REFRESH PLUS) 0.5 % SOLN 1 drop 3 (three) times daily as needed.  . fluticasone-salmeterol (ADVAIR HFA) 230-21 MCG/ACT inhaler Inhale 2 puffs into the  lungs 2 (two) times daily.  Marland Kitchen GLUCOSAMINE PO Take 1 tablet by mouth daily.  Marland Kitchen HYDROcodone-homatropine (HYCODAN) 5-1.5 MG/5ML syrup Take 5 mLs by mouth every 6 (six) hours as needed for cough.  Marland Kitchen ipratropium (ATROVENT) 0.06 % nasal spray Place 2 sprays into both nostrils 4 (four) times daily.  Marland Kitchen levothyroxine (SYNTHROID, LEVOTHROID) 137 MCG tablet Take 1 tablet (137 mcg total) by mouth daily before breakfast.  . mometasone (NASONEX) 50 MCG/ACT nasal spray Place 1 spray into the nose 2 (two) times daily.  . Multiple Vitamin (MULTIVITAMIN) capsule Take 1 capsule by mouth 2 (two) times daily.   . mycophenolate (CELLCEPT) 500 MG tablet Take 2 tablets (1,000 mg total) by mouth 2 (two) times daily.  Marland Kitchen omeprazole (PRILOSEC) 40 MG capsule Take 40 mg by mouth daily.  Marland Kitchen PARoxetine (PAXIL) 10 MG tablet Take 1 tablet (10 mg total) by mouth at bedtime.  . predniSONE (DELTASONE) 20 MG tablet Take 1 tablet (20 mg total) by mouth daily with breakfast.  . ranitidine (ZANTAC) 150 MG tablet Take 150 mg by mouth at bedtime.  No facility-administered encounter medications on file as of 11/10/2015.     Allergies:  Tramadol and Sulfa antibiotics  Review of Systems: Gen:  Denies  fever, sweats. HEENT: Denies blurred vision. Cvc:  No dizziness, chest pain or heaviness Resp:   Denies sputum porduction. Gi: Denies swallowing difficulty, stomach pain.  Gu:  Denies bladder incontinence, burning urine Ext:   No Joint pain, stiffness. Skin: No skin rash, easy bruising. Endoc:  No polyuria, polydipsia. Psych: No depression, insomnia. Other:  All other systems were reviewed and found to be negative other than what is mentioned in the HPI.   Physical Examination:   VS: BP 128/72 mmHg  Pulse 114  Ht _0  (1.727 m)  Wt 183 lb 6.4 oz (83.19 kg)  BMI 27.89 kg/m2  SpO2 91%  General Appearance: No distress  Neuro:without focal findings,  speech normal,  HEENT: PERRLA, EOM intact. Pulmonary: normal breath sounds,  No wheezing.   CardiovascularNormal S1,S2.  No m/r/g.   Abdomen: Benign, Soft, non-tender. Renal:  No costovertebral tenderness  GU:  Not performed at this time. Endoc: No evident thyromegaly, no signs of acromegaly. Skin:   warm, no rash. Extremities: normal, no cyanosis, clubbing.   LABORATORY PANEL:   CBC No results for input(s): WBC, HGB, HCT, PLT in the last 168 hours. ------------------------------------------------------------------------------------------------------------------  Chemistries  No results for input(s): NA, K, CL, CO2, GLUCOSE, BUN, CREATININE, CALCIUM, MG, AST, ALT, ALKPHOS, BILITOT in the last 168 hours.  Invalid input(s): GFRCGP ------------------------------------------------------------------------------------------------------------------  Cardiac Enzymes No results for input(s): TROPONINI in the last 168 hours. ------------------------------------------------------------  RADIOLOGY:   No results found for this or any previous visit. Results for orders placed during the hospital encounter of 08/26/15  DG Chest 2 View   Narrative CLINICAL DATA:  Productive cough, shortness of breath, and wheezing for the past 3 weeks, history of sarcoidosis and asthma, nonsmoker.  EXAM: CHEST  2 VIEW  COMPARISON:  CT scan of the chest dated Feb 20, 2015 and portable chest x-ray of November 26, 2014.  FINDINGS: The lungs are well-expanded. The interstitial markings are increased diffusely. Areas of near confluent interstitial density superomedially in the right upper lobe and in the more inferior aspects of the left upper lobe are again demonstrated. There is chronic mild tenting of the left hemidiaphragm laterally. There is no alveolar infiltrate. There is no pleural effusion. The heart is normal in size. Left hilar prominence is present and slightly more conspicuous than in the past. The bony thorax exhibits no acute abnormality.  IMPRESSION: Extend sick  chronic interstitial change consistent with known sarcoidosis. Superimposed acute bronchitic change may be present. There is no alveolar pneumonia. There is been waxing and waning of left hilar prominence over the past 9 months. This may reflect lymphadenopathy.  Follow-up radiographs following a trial of antibiotic therapy may be useful.   Electronically Signed   By: David  Martinique M.D.   On: 08/26/2015 16:40    ------------------------------------------------------------------------------------------------------------------  Thank  you for allowing [facility] to assist in the care of your patient. Our recommendations are noted above.  Please contact us if we can be of further service.   Marda Stalker, MD.  [facility] Office Number: [phone]  Patricia Pesa, M.D.  Vilinda Boehringer, M.D.  Merton Border, M.D

## 2015-11-10 NOTE — Addendum Note (Signed)
Addended by: Maryanna Shape A on: 11/10/2015 09:56 AM   Modules accepted: Orders

## 2015-11-19 ENCOUNTER — Other Ambulatory Visit: Payer: Self-pay

## 2015-11-19 MED ORDER — ALBUTEROL SULFATE HFA 108 (90 BASE) MCG/ACT IN AERS: 2.0000 | INHALATION_SPRAY | Freq: Four times a day (QID) | RESPIRATORY_TRACT | Status: DC | PRN

## 2015-11-19 MED ORDER — FLUTICASONE FUROATE-VILANTEROL 200-25 MCG/INH IN AEPB: 1.0000 | INHALATION_SPRAY | Freq: Every day | RESPIRATORY_TRACT | Status: DC

## 2015-11-19 NOTE — Telephone Encounter (Signed)
Pt informed coupon will be placed up front for her to pick up. Nothing further needed.

## 2015-11-19 NOTE — Telephone Encounter (Signed)
Pt needs Breo called to pharmacy

## 2015-11-20 ENCOUNTER — Ambulatory Visit: Payer: Self-pay | Admitting: Urology

## 2016-01-01 ENCOUNTER — Other Ambulatory Visit
Admission: RE | Admit: 2016-01-01 | Discharge: 2016-01-01 | Disposition: A | Discharge status: Home / Self Care | Payer: 59 | Source: Ambulatory Visit | Attending: Unknown Physician Specialty | Admitting: Unknown Physician Specialty

## 2016-01-01 DIAGNOSIS — J40 Bronchitis, not specified as acute or chronic: Secondary | ICD-10-CM | POA: Diagnosis not present

## 2016-01-01 LAB — EXPECTORATED SPUTUM ASSESSMENT W GRAM STAIN, RFLX TO RESP C

## 2016-01-01 LAB — EXPECTORATED SPUTUM ASSESSMENT W REFEX TO RESP CULTURE

## 2016-01-06 LAB — CULTURE, RESPIRATORY

## 2016-01-06 LAB — CULTURE, RESPIRATORY W GRAM STAIN

## 2016-02-09 ENCOUNTER — Other Ambulatory Visit: Payer: Self-pay

## 2016-02-09 MED ORDER — MYCOPHENOLATE MOFETIL 500 MG PO TABS: 1000.0000 mg | ORAL_TABLET | Freq: Two times a day (BID) | ORAL | Status: DC

## 2016-02-29 ENCOUNTER — Other Ambulatory Visit: Payer: Self-pay | Admitting: Internal Medicine

## 2016-02-29 DIAGNOSIS — Z1231 Encounter for screening mammogram for malignant neoplasm of breast: Secondary | ICD-10-CM

## 2016-03-01 ENCOUNTER — Ambulatory Visit
Admission: RE | Admit: 2016-03-01 | Discharge: 2016-03-01 | Disposition: A | Discharge status: Home / Self Care | Payer: 59 | Source: Ambulatory Visit | Attending: Internal Medicine | Admitting: Internal Medicine

## 2016-03-01 ENCOUNTER — Other Ambulatory Visit: Payer: Self-pay | Admitting: Internal Medicine

## 2016-03-01 DIAGNOSIS — Z1231 Encounter for screening mammogram for malignant neoplasm of breast: Secondary | ICD-10-CM

## 2016-03-07 ENCOUNTER — Ambulatory Visit: Payer: 59

## 2016-03-10 ENCOUNTER — Ambulatory Visit: Payer: 59 | Admitting: Internal Medicine

## 2016-03-30 ENCOUNTER — Ambulatory Visit (INDEPENDENT_AMBULATORY_CARE_PROVIDER_SITE_OTHER): Payer: 59 | Admitting: Internal Medicine

## 2016-03-30 ENCOUNTER — Other Ambulatory Visit: Payer: Self-pay | Admitting: Internal Medicine

## 2016-03-30 ENCOUNTER — Encounter: Payer: Self-pay | Admitting: Internal Medicine

## 2016-03-30 VITALS — BP 140/80 | HR 60 | Ht 68.0 in | Wt 179.0 lb

## 2016-03-30 DIAGNOSIS — R300 Dysuria: Secondary | ICD-10-CM

## 2016-03-30 LAB — POC URINALYSIS WITH MICROSCOPIC (NON AUTO)MANUAL RESULT
Bilirubin, UA: NEGATIVE
Blood, UA: NEGATIVE
Crystals: 0
Epithelial cells, urine per micros: 0
Glucose, UA: NEGATIVE
Ketones, UA: NEGATIVE
Leukocytes, UA: NEGATIVE
Mucus, UA: 0
Nitrite, UA: NEGATIVE
Protein, UA: NEGATIVE
RBC: 0 M/uL — AB (ref 4.04–5.48)
Spec Grav, UA: 1.015
Urobilinogen, UA: 0.2
WBC Casts, UA: 4
pH, UA: 6

## 2016-03-30 MED ORDER — CIPROFLOXACIN HCL 250 MG PO TABS: 250.0000 mg | ORAL_TABLET | Freq: Two times a day (BID) | ORAL | Status: DC

## 2016-03-30 NOTE — Progress Notes (Signed)
Date:  03/30/2016   Name:  Stacie [name]   DOB:  1956-08-18   MRN:  [id number]   Chief Complaint: Urinary Tract Infection Urinary Tract Infection  This is a new problem. The current episode started in the past 7 days. The problem occurs every urination. The quality of the pain is described as burning. Associated symptoms include chills and frequency. Pertinent negatives include no hematuria or urgency.  She took AZO yesterday with possible mild improvement.  Review of Systems  Constitutional: Positive for chills.  Respiratory: Positive for cough and chest tightness.   Cardiovascular: Negative for chest pain and palpitations.  Genitourinary: Positive for frequency. Negative for urgency, hematuria, difficulty urinating and dyspareunia.  Neurological: Negative for dizziness and headaches.    Patient Active Problem List   Diagnosis Date Noted  . Acquired hypothyroidism 03/27/2015  . Essential (primary) hypertension 03/27/2015  . H/O abnormal cervical Papanicolaou smear 03/27/2015  . H/O renal calculi 03/27/2015  . Allergic rhinitis 01/07/2015  . Obstructive sleep apnea 09/29/2014  . Other malaise and fatigue 06/03/2014  . Asthma, chronic 02/24/2014  . Cough 04/30/2013  . Pulmonary sarcoidosis ([place]) 03/12/2013  . Enteritis due to Clostridium difficile 03/12/2013  . CKD 03/12/2013  . Calcium blood increased 05/23/2012  . Besnier-Boeck disease ([place]) 02/08/2011    Prior to Admission medications   Medication Sig Start Date End Date Taking? Authorizing Provider  albuterol (VENTOLIN HFA) 108 (90 Base) MCG/ACT inhaler Inhale 2 puffs into the lungs every 6 (six) hours as needed for wheezing. 11/19/15  Yes Laverle Hobby, MD  aspirin 81 MG tablet Take 81 mg by mouth daily.   Yes Historical Provider, MD  carboxymethylcellulose (REFRESH PLUS) 0.5 % SOLN 1 drop 3 (three) times daily as needed.   Yes Historical Provider, MD  erythromycin (ERY-TAB) 250 MG EC tablet Take 250 mg by  mouth daily.   Yes Historical Provider, MD  Fluticasone Furoate-Vilanterol (BREO ELLIPTA) 200-25 MCG/INH AEPB Inhale 1 puff into the lungs daily. 11/19/15  Yes Laverle Hobby, MD  fluticasone-salmeterol (ADVAIR HFA) 230-21 MCG/ACT inhaler Inhale 2 puffs into the lungs 2 (two) times daily. 01/07/15  Yes Juanito Doom, MD  GLUCOSAMINE PO Take 1 tablet by mouth daily.   Yes Historical Provider, MD  ipratropium (ATROVENT) 0.06 % nasal spray Place 2 sprays into both nostrils 4 (four) times daily.   Yes Historical Provider, MD  levothyroxine (SYNTHROID, LEVOTHROID) 137 MCG tablet Take 1 tablet (137 mcg total) by mouth daily before breakfast. 03/30/15  Yes Glean Hess, MD  mometasone (NASONEX) 50 MCG/ACT nasal spray Place 1 spray into the nose 2 (two) times daily.   Yes Historical Provider, MD  Multiple Vitamin (MULTIVITAMIN) capsule Take 1 capsule by mouth 2 (two) times daily.    Yes Historical Provider, MD  mycophenolate (CELLCEPT) 500 MG tablet Take 2 tablets (1,000 mg total) by mouth 2 (two) times daily. 02/09/16  Yes Laverle Hobby, MD  omeprazole (PRILOSEC) 40 MG capsule Take 40 mg by mouth daily.   Yes Historical Provider, MD  HYDROcodone-homatropine (HYCODAN) 5-1.5 MG/5ML syrup Take 5 mLs by mouth every 6 (six) hours as needed for cough. Patient not taking: Reported on 03/30/2016 06/02/15   Juanito Doom, MD  predniSONE (DELTASONE) 20 MG tablet Take 1 tablet (20 mg total) by mouth daily with breakfast. Patient not taking: Reported on 03/30/2016 06/02/15   Juanito Doom, MD  ranitidine (ZANTAC) 150 MG tablet Take 150 mg by mouth at bedtime. Reported on 03/30/2016  Historical Provider, MD    Allergies  Allergen Reactions  . Tramadol     Nausea and vomiting  . Sulfa Antibiotics Rash    As an infant    Past Surgical History  Procedure Laterality Date  . Partial hysterectomy    . Colon surgery      "colon was fused to bladder - operated on both"  . Abdominal  hysterectomy    . Tubal ligation    . Pars plana vitrectomy Right 05/20/2015    Procedure: PARS PLANA VITRECTOMY WITH 25 GAUGE, laser;  Surgeon: Milus Height, MD;  Location: [facility] ORS;  Service: Ophthalmology;  Laterality: Right;    Social History  Substance Use Topics  . Smoking status: Never Smoker   . Smokeless tobacco: Never Used  . Alcohol Use: No     Medication list has been reviewed and updated.   Physical Exam  Constitutional: She is oriented to person, place, and time. She appears well-developed. No distress.  HENT:  Head: Normocephalic and atraumatic.  Neck: Normal range of motion. Neck supple.  Cardiovascular: Normal rate, regular rhythm and normal heart sounds.   Pulmonary/Chest: Effort normal and breath sounds normal. No respiratory distress.  Abdominal: Soft. Normal appearance and bowel sounds are normal. There is no tenderness. There is no CVA tenderness.  Musculoskeletal: Normal range of motion.  Lymphadenopathy:    She has no cervical adenopathy.  Neurological: She is alert and oriented to person, place, and time.  Skin: Skin is warm and dry. No rash noted.  Psychiatric: She has a normal mood and affect. Her behavior is normal. Thought content normal.  Nursing note and vitals reviewed.   BP 140/80 mmHg  Pulse 60  Ht _0  (1.727 m)  Wt 179 lb (81.194 kg)  BMI 27.22 kg/m2  Assessment and Plan: 1. Dysuria - POC urinalysis w microscopic (non auto) - Urine Culture - ciprofloxacin (CIPRO) 250 MG tablet; Take 1 tablet (250 mg total) by mouth 2 (two) times daily.  Dispense: 10 tablet; Refill: 0   Halina Maidens, MD [facility]  03/30/2016

## 2016-04-02 LAB — URINE CULTURE: Organism ID, Bacteria: NO GROWTH

## 2016-04-04 ENCOUNTER — Ambulatory Visit (INDEPENDENT_AMBULATORY_CARE_PROVIDER_SITE_OTHER): Payer: 59 | Admitting: *Deleted

## 2016-04-04 DIAGNOSIS — D869 Sarcoidosis, unspecified: Secondary | ICD-10-CM

## 2016-04-04 LAB — PULMONARY FUNCTION TEST
DL/VA % pred: 66 %
DL/VA: 3.43 ml/min/mmHg/L
DLCO unc % pred: 87 %
DLCO unc: 24.82 ml/min/mmHg
FEF 25-75 Post: 0.76 L/sec
FEF 25-75 Pre: 0.76 L/sec
FEF2575-%Change-Post: 0 %
FEF2575-%Pred-Post: 30 %
FEF2575-%Pred-Pre: 30 %
FEV1-%Change-Post: 0 %
FEV1-%Pred-Post: 49 %
FEV1-%Pred-Pre: 49 %
FEV1-Post: 1.39 L
FEV1-Pre: 1.39 L
FEV1FVC-%Change-Post: 4 %
FEV1FVC-%Pred-Pre: 84 %
FEV6-%Change-Post: -3 %
FEV6-%Pred-Post: 57 %
FEV6-%Pred-Pre: 59 %
FEV6-Post: 2.02 L
FEV6-Pre: 2.1 L
FEV6FVC-%Change-Post: 0 %
FEV6FVC-%Pred-Post: 103 %
FEV6FVC-%Pred-Pre: 102 %
FVC-%Change-Post: -4 %
FVC-%Pred-Post: 55 %
FVC-%Pred-Pre: 58 %
FVC-Post: 2.02 L
FVC-Pre: 2.12 L
Post FEV1/FVC ratio: 69 %
Post FEV6/FVC ratio: 100 %
Pre FEV1/FVC ratio: 66 %
Pre FEV6/FVC Ratio: 99 %
RV % pred: 109 %
RV: 2.35 L
TLC % pred: 85 %
TLC: 4.69 L

## 2016-04-04 NOTE — Progress Notes (Signed)
PFT performed today.

## 2016-04-04 NOTE — Progress Notes (Signed)
SMW performed today.

## 2016-04-05 ENCOUNTER — Other Ambulatory Visit: Payer: Self-pay

## 2016-04-05 DIAGNOSIS — D860 Sarcoidosis of lung: Secondary | ICD-10-CM

## 2016-04-05 DIAGNOSIS — N184 Chronic kidney disease, stage 4 (severe): Secondary | ICD-10-CM

## 2016-04-05 NOTE — Addendum Note (Signed)
Addended by: Laverle Hobby on: 04/05/2016 12:37 PM   Modules accepted: Miquel Dunn

## 2016-04-06 ENCOUNTER — Telehealth: Payer: Self-pay | Admitting: Internal Medicine

## 2016-04-06 NOTE — Telephone Encounter (Signed)
Pt calling stating she was told to make an appointment for labs and for a follow up after that. Please call back to schedule.

## 2016-04-06 NOTE — Telephone Encounter (Signed)
Contacted patient and she didn't need to schedule an appointment, she just was wondering where to go for her labs. She also advised that Dr. Despina Pole advised that she needed a CXR as well.  With that being the case, I just advised the patient to come to [facility] and she could get the CXR and the labs drawn at [facility] the same day.  Pt stated that DR would advise her on her labs and CXR, that she had an appointment in August and depending what the outcome of the lab work and CXR was if she needed an earlier appointment, she would call us to schedule. Nothing else needed at this time. Rhonda J Cobb

## 2016-04-07 ENCOUNTER — Other Ambulatory Visit: Payer: Self-pay | Admitting: *Deleted

## 2016-04-07 ENCOUNTER — Ambulatory Visit
Admission: RE | Admit: 2016-04-07 | Discharge: 2016-04-07 | Disposition: A | Discharge status: Home / Self Care | Payer: 59 | Source: Ambulatory Visit | Attending: Internal Medicine | Admitting: Internal Medicine

## 2016-04-07 ENCOUNTER — Other Ambulatory Visit
Admission: RE | Admit: 2016-04-07 | Discharge: 2016-04-07 | Disposition: A | Discharge status: Home / Self Care | Payer: 59 | Source: Ambulatory Visit | Attending: Internal Medicine | Admitting: Internal Medicine

## 2016-04-07 ENCOUNTER — Telehealth: Payer: Self-pay | Admitting: *Deleted

## 2016-04-07 DIAGNOSIS — N189 Chronic kidney disease, unspecified: Secondary | ICD-10-CM

## 2016-04-07 DIAGNOSIS — D860 Sarcoidosis of lung: Secondary | ICD-10-CM | POA: Diagnosis not present

## 2016-04-07 DIAGNOSIS — N184 Chronic kidney disease, stage 4 (severe): Secondary | ICD-10-CM | POA: Diagnosis not present

## 2016-04-07 DIAGNOSIS — R938 Abnormal findings on diagnostic imaging of other specified body structures: Secondary | ICD-10-CM | POA: Insufficient documentation

## 2016-04-07 LAB — HEPATIC FUNCTION PANEL
ALT: 15 U/L (ref 14–54)
AST: 25 U/L (ref 15–41)
Albumin: 3.8 g/dL (ref 3.5–5.0)
Alkaline Phosphatase: 79 U/L (ref 38–126)
Bilirubin, Direct: <0.1 mg/dL — ABNORMAL LOW (ref 0.1–0.5)
Total Bilirubin: 0.7 mg/dL (ref 0.3–1.2)
Total Protein: 7.5 g/dL (ref 6.5–8.1)

## 2016-04-07 LAB — BASIC METABOLIC PANEL
Anion gap: 9 (ref 5–15)
BUN: 21 mg/dL — ABNORMAL HIGH (ref 6–20)
CO2: 29 mmol/L (ref 22–32)
Calcium: 10.5 mg/dL — ABNORMAL HIGH (ref 8.9–10.3)
Chloride: 103 mmol/L (ref 101–111)
Creatinine, Ser: 1.23 mg/dL — ABNORMAL HIGH (ref 0.44–1.00)
GFR calc Af Amer: 54 mL/min — ABNORMAL LOW (ref >60)
GFR calc non Af Amer: 47 mL/min — ABNORMAL LOW (ref >60)
Glucose, Bld: 99 mg/dL (ref 65–99)
Potassium: 3.4 mmol/L — ABNORMAL LOW (ref 3.5–5.1)
Sodium: 141 mmol/L (ref 135–145)

## 2016-04-07 LAB — EXPECTORATED SPUTUM ASSESSMENT W GRAM STAIN, RFLX TO RESP C

## 2016-04-07 LAB — EXPECTORATED SPUTUM ASSESSMENT W REFEX TO RESP CULTURE

## 2016-04-07 MED ORDER — METHOTREXATE 2.5 MG PO TABS: ORAL_TABLET | ORAL | Status: DC

## 2016-04-07 MED ORDER — PREDNISONE 20 MG PO TABS: ORAL_TABLET | ORAL | Status: DC

## 2016-04-07 NOTE — Telephone Encounter (Signed)
Pt informed and medications sent to pt's pharmacy.

## 2016-04-07 NOTE — Telephone Encounter (Signed)
LMOM for pt to return call.

## 2016-04-07 NOTE — Telephone Encounter (Signed)
-----  Message from Laverle Hobby, MD sent at 04/05/2016 12:28 PM EDT ----- Pt updated on results on plan. Please arrange the following.  --Stop cellcept.  --Check CMP, 2 view CXR (re: CKD, sarcoidosis).   After blood draw and CXR may start: --Prednisone 30 mg daily for one month, then maintain on 20 mg daily until follow up with me in august.  --Methotrexate 2.5 mg tab once weekly, increase by 1 tab (2.5 mg) weekly until taking 4 tabs weekly and continue at that dose.   Thanks.

## 2016-04-08 ENCOUNTER — Other Ambulatory Visit: Payer: Self-pay | Admitting: Internal Medicine

## 2016-04-13 ENCOUNTER — Other Ambulatory Visit
Admission: RE | Admit: 2016-04-13 | Discharge: 2016-04-13 | Disposition: A | Discharge status: Home / Self Care | Payer: 59 | Source: Ambulatory Visit | Attending: Infectious Diseases | Admitting: Infectious Diseases

## 2016-04-13 DIAGNOSIS — J690 Pneumonitis due to inhalation of food and vomit: Secondary | ICD-10-CM | POA: Insufficient documentation

## 2016-04-13 LAB — EXPECTORATED SPUTUM ASSESSMENT W REFEX TO RESP CULTURE

## 2016-04-13 LAB — EXPECTORATED SPUTUM ASSESSMENT W GRAM STAIN, RFLX TO RESP C

## 2016-04-29 LAB — ACID FAST SMEAR (AFB, MYCOBACTERIA): Acid Fast Smear: NEGATIVE

## 2016-05-17 ENCOUNTER — Ambulatory Visit: Payer: 59 | Admitting: Internal Medicine

## 2016-05-17 ENCOUNTER — Encounter: Payer: Self-pay | Admitting: *Deleted

## 2016-05-21 LAB — ACID FAST CULTURE WITH REFLEXED SENSITIVITIES (MYCOBACTERIA): Acid Fast Culture: NEGATIVE

## 2016-05-24 NOTE — Progress Notes (Signed)
* [facility]     Assessment and Plan:  Pulmonary sarcoidosis. -She continues to have symptoms of dyspnea and cough on exertion, with progressive decline over the last 2 years. Last visit, she was switched to methotrexate and tapering dose of prednisone. -PFT, 6 minute walk recently showed continued slow decline in pulmonary functions. -We'll check an Ace level, and calcium level slightly elevated, will monitor.  -She does have a yearly eye check for sarcoidosis.   Asthma. -Continued cough and dyspnea with exertion and wheezing consistent with continue asthma exacerbation. I think a lot of her breathing issues currently are more due to uncontrolled asthma.  --The patient has had had multiple exacerbation (>3) over the past year, and continuously appears to be in an exacerbation.  -- Abs eo count of 400 down to 200 on prednisone. IgE level of 45.  --Currently on Breo, and cut down on prednisone as advised.   Obstructive sleep apnea. -Appears to be doing well with her current CPAP. -Continue CPAP with 2 L of oxygen.  Chronic respiratory failure. -Continued chronic respiratory failure, oxygen dependent at night. -Continue oxygen at night, continue to monitor. Rest her status.  Chronic kidney disease. -This can contribute to dyspnea, therefore, we'll need to continue monitoring her kidney status. --Review of lab results shows creatinine of stable stage 2 CKD, will continue to monitor.   Date: 05/24/2016  MRN# [id number] Stacie PENNELLA [birth year]   Stacie [name] is a 60 y.o. old female seen in follow up for chief complaint of  Chief Complaint  Patient presents with  . Follow-up     HPI:   The patient (pronounced Stacie [name])  is a 60 year old female with a history of asthma, sarcoidosis, chronic kidney disease and obstructive sleep apnea. She had continued symptoms on methotrexate and Plaquenil in the past, she also developed increasing LFTs. She was started  on CellCept in June 2016. She is currently taking methotrexate 4 tabs weekly, prednisone 20 mg daily.  She is currently on breo, she uses a rescue inhaler but does not notice much of a difference.   At her last visit it was noted that her breathing has been progressively declining on Cellcept, therefore she was switched back to methotrexate and a slow prednisone taper. She had been started empirically on azithromycin by Dr. Ola Spurr (ID) and noted that it has helped her breathing, she has ran out of it now and has noted that her breathing has worsened again.  She was taking azithromycin 258m once per day.     Pulmonary function testing 04/04/16 -Spirometry: FVC was 58% of predicted, FEV1 was 49% of predicted, FEV to FVC ratio 69%. There was no significant improvement with bronchodilator. Lung volumes: TLC was 85% of predicted, RV to TLC ratio was 125%, RV was 109%. Diffusion: 87%. Interpretation: Overall this test shows severe obstructive lung disease, combined with restrictive lung disease, despite this, the patient's diffusion capacity remains good  Review 6 minute walk test from 04/04/16: Baseline oxygen was 92%, the patient walked a total of 945 feet, oxygen sats dropped to 83% with moderate to severe dyspnea, patient was placed on 2 L of oxygen. Oxygen saturation improved to 95%.  She is currently on advair bid, she has a rescue inhaler but does not use it. She is not sure that it helps.   She is using her CPAP every night, she is not sleepy during the day. She uses oxygen during CPAP at 2L.  Reviewed recent test results;  sputum AFB negative (cx pending), urine cx negative. Review of most recent download data from 04/26/16 showed residual AHI of 0.6, 95th percentile pressure of 9.7, excellent pressure of 11.3. Compliance is excellent at 93%, average usage is about 7 hours.  CXR 04/07/16 images reviewed; consistent with severe fibrotic lung disease, minimally changed in comparison with  previous films.  PFT 04/04/16: FVC 58%, FEV1 49%, TLC 85%. DLCO 87% Ratio; 66%; c/w combined severe restrictive and obstructive lung disease.  PFT 10/28/14; FVC 63%, FEV1 53%, TLC 63%; DLCO 53%.   Review of films of High res CT chest. 02/20/2015: Compared with the previous from February. There is minimal change, possibly slight improvement. Review of chest x-ray images and 08/26/2015, in comparison with previous years. This shows progressive increase in the bilateral interstitial changes  Synopsis: Stacie [name] first saw the [facility] pulmonary clinic in May 2014 for evaluation of sarcoidosis. She was diagnosed with sarcoid in 2009 at the [facility] via an EBUS guided fine needle aspiration of a hilar lymph node as well as an endobronchial biopsy which showed noncaseating granulomas with negative special stains for infectious organisms. She was treated off and on with steroids from 2009 2 2014 and then in 2014 was started on methotrexate in addition to Plaquenil. She also has chronic kidney disease which is believed to be directly related to sarcoidosis. She had no improvement with methotrexate and plaquenil and her TLC and DLCO declined slightly so in January 2015 she was started on imuran but this had to be held because of increasing LFTs. She ended up seeing Dr. Lenise Arena at [facility] in 01/2014 for her sarcoid. He felt like her symptoms were more due to Asthma than Sarcoid. Plaquenil was restarted, prednisone tapered, and Advair changed to QVar. In 2016 she was admitted for community-acquired pneumonia and had in general worsening respiratory symptoms. She improved, but lung function testing had shown a fairly progressive decline in DLCO. Despite treatment for the pneumonia, she noted severe worsening dyspnea over several months. CellCept was started in June 2016.   SIX MIN WALK 04/04/2016 10/21/2014 06/03/2014 03/12/2013  Medications Aspirin, Azithromycin,  Cipro, Levothyroxine, Nasonex,  - - -  Supplimental Oxygen during Test? (L/min) No No No No  Laps 6 - - -  Partial Lap (in Meters) 0 - - -  Baseline BP (sitting) 138/86 - - -  Baseline Heartrate 107 - - -  Baseline Dyspnea (Borg Scale) 0.5 - - -  Baseline Fatigue (Borg Scale) 3 - - -  Baseline SPO2 92 - - -  BP (sitting) 162/90 - - -  Heartrate 131 - - -  Dyspnea (Borg Scale) 5 - - -  Fatigue (Borg Scale) 36 - - -  SPO2 83 - - -  BP (sitting) 138/88 - - -  Heartrate 109 - - -  SPO2 95 - - -  Stopped or Paused before Six Minutes Yes - - -  Other Symptoms at end of Exercise due to pt's O2 sats decreasing to 83%. - - -  Distance Completed 288 - - -  Tech Comments: Pt walked at moderate pace and got very short winded. checked O2 Sats and they were 83%. placed pt on 2L O2 and pt's sats came up to 95%. - Pt walked a moderately fast pace.  Pt was SOB during lap 2 and 3, but tolerated walk well. After walking for 6 minutes the pt walked 1500 ft.  Her o2 sat after 3  mintutes was 88%ra and pulse 132, after the second 3 minutes sat was 96%ra and pulse 132//lmr    Medication:   Outpatient Encounter Prescriptions as of 05/26/2016  Medication Sig  . albuterol (VENTOLIN HFA) 108 (90 Base) MCG/ACT inhaler Inhale 2 puffs into the lungs every 6 (six) hours as needed for wheezing.  Marland Kitchen aspirin 81 MG tablet Take 81 mg by mouth daily.  Marland Kitchen azithromycin (ZITHROMAX) 250 MG tablet Take 1 tablet by mouth daily.  . carboxymethylcellulose (REFRESH PLUS) 0.5 % SOLN 1 drop 3 (three) times daily as needed.  . ciprofloxacin (CIPRO) 250 MG tablet Take 1 tablet (250 mg total) by mouth 2 (two) times daily.  Marland Kitchen erythromycin (ERY-TAB) 250 MG EC tablet Take 250 mg by mouth daily.  . Fluticasone Furoate-Vilanterol (BREO ELLIPTA) 200-25 MCG/INH AEPB Inhale 1 puff into the lungs daily.  . fluticasone-salmeterol (ADVAIR HFA) 230-21 MCG/ACT inhaler Inhale 2 puffs into the lungs 2 (two) times daily.  Marland Kitchen GLUCOSAMINE PO Take 1 tablet  by mouth daily.  Marland Kitchen HYDROcodone-homatropine (HYCODAN) 5-1.5 MG/5ML syrup Take 5 mLs by mouth every 6 (six) hours as needed for cough. (Patient not taking: Reported on 03/30/2016)  . ipratropium (ATROVENT) 0.06 % nasal spray Place 2 sprays into both nostrils 4 (four) times daily.  . methotrexate (RHEUMATREX) 2.5 MG tablet 2.5 mg tablet once weekly, increase by 1 tablet weekly until taking 4 tablets weekly and then continue at that dose. Caution:Chemotherapy. Protect from light.  . mometasone (NASONEX) 50 MCG/ACT nasal spray Place 1 spray into the nose 2 (two) times daily.  . Multiple Vitamin (MULTIVITAMIN) capsule Take 1 capsule by mouth 2 (two) times daily.   . mycophenolate (CELLCEPT) 500 MG tablet Take 2 tablets (1,000 mg total) by mouth 2 (two) times daily.  Marland Kitchen omeprazole (PRILOSEC) 40 MG capsule Take 40 mg by mouth daily.  . predniSONE (DELTASONE) 20 MG tablet Take 1 tablet (20 mg total) by mouth daily with breakfast. (Patient not taking: Reported on 03/30/2016)  . predniSONE (DELTASONE) 20 MG tablet Take 30 mg daily x 1 month and then decrease to 20 mg daily until follow up  . ranitidine (ZANTAC) 150 MG tablet Take 150 mg by mouth at bedtime. Reported on 03/30/2016  . SYNTHROID 137 MCG tablet TAKE 1 TABLET (137 MCG TOTAL) BY MOUTH DAILY BEFORE BREAKFAST.   No facility-administered encounter medications on file as of 05/26/2016.      Allergies:  Nitrofurantoin; Tramadol; and Sulfa antibiotics  Review of Systems: Gen:  Denies  fever, sweats. HEENT: Denies blurred vision. Cvc:  No dizziness, chest pain or heaviness Resp:   Denies sputum porduction. Gi: Denies swallowing difficulty, stomach pain.  Gu:  Denies bladder incontinence, burning urine Ext:   No Joint pain, stiffness. Skin: No skin rash, easy bruising. Endoc:  No polyuria, polydipsia. Psych: No depression, insomnia. Other:  All other systems were reviewed and found to be negative other than what is mentioned in the HPI.    Physical Examination:   VS: BP 126/78 (BP Location: Left Arm, Cuff Size: Normal)   Pulse (!) 103   Ht 5' 7.5" (1.715 m)   Wt 184 lb 9.6 oz (83.7 kg)   SpO2 92%   BMI 28.49 kg/m   General Appearance: No distress  Neuro:without focal findings,  speech normal,  HEENT: PERRLA, EOM intact. Pulmonary: normal breath sounds,Scattered bilateral wheezing CardiovascularNormal S1,S2.  No m/r/g.   Abdomen: Benign, Soft, non-tender. Renal:  No costovertebral tenderness  GU:  Not performed at this  time. Endoc: No evident thyromegaly, no signs of acromegaly. Skin:   warm, no rash. Extremities: normal, no cyanosis, clubbing.   LABORATORY PANEL:   CBC No results for input(s): WBC, HGB, HCT, PLT in the last 168 hours. ------------------------------------------------------------------------------------------------------------------  Chemistries  No results for input(s): NA, K, CL, CO2, GLUCOSE, BUN, CREATININE, CALCIUM, MG, AST, ALT, ALKPHOS, BILITOT in the last 168 hours.  Invalid input(s): GFRCGP ------------------------------------------------------------------------------------------------------------------  Cardiac Enzymes No results for input(s): TROPONINI in the last 168 hours. ------------------------------------------------------------  RADIOLOGY:   No results found for this or any previous visit. Results for orders placed during the hospital encounter of 08/26/15  DG Chest 2 View   Narrative CLINICAL DATA:  Productive cough, shortness of breath, and wheezing for the past 3 weeks, history of sarcoidosis and asthma, nonsmoker.  EXAM: CHEST  2 VIEW  COMPARISON:  CT scan of the chest dated Feb 20, 2015 and portable chest x-ray of November 26, 2014.  FINDINGS: The lungs are well-expanded. The interstitial markings are increased diffusely. Areas of near confluent interstitial density superomedially in the right upper lobe and in the more inferior aspects of the left upper  lobe are again demonstrated. There is chronic mild tenting of the left hemidiaphragm laterally. There is no alveolar infiltrate. There is no pleural effusion. The heart is normal in size. Left hilar prominence is present and slightly more conspicuous than in the past. The bony thorax exhibits no acute abnormality.  IMPRESSION: Extend sick chronic interstitial change consistent with known sarcoidosis. Superimposed acute bronchitic change may be present. There is no alveolar pneumonia. There is been waxing and waning of left hilar prominence over the past 9 months. This may reflect lymphadenopathy.  Follow-up radiographs following a trial of antibiotic therapy may be useful.   Electronically Signed   By: David  Martinique M.D.   On: 08/26/2015 16:40    ------------------------------------------------------------------------------------------------------------------  Thank  you for allowing [facility] to assist in the care of your patient. Our recommendations are noted above.  Please contact us if we can be of further service.   Marda Stalker, MD.   Pulmonary and Critical Care Office Number: [phone]  Patricia Pesa, M.D.  Vilinda Boehringer, M.D.  Merton Border, M.D

## 2016-05-25 ENCOUNTER — Encounter: Payer: Self-pay | Admitting: Internal Medicine

## 2016-05-26 ENCOUNTER — Ambulatory Visit (INDEPENDENT_AMBULATORY_CARE_PROVIDER_SITE_OTHER): Payer: 59 | Admitting: Internal Medicine

## 2016-05-26 ENCOUNTER — Encounter: Payer: Self-pay | Admitting: Internal Medicine

## 2016-05-26 VITALS — BP 126/78 | HR 103 | Ht 67.5 in | Wt 184.6 lb

## 2016-05-26 DIAGNOSIS — J4540 Moderate persistent asthma, uncomplicated: Secondary | ICD-10-CM | POA: Diagnosis not present

## 2016-05-26 DIAGNOSIS — R05 Cough: Secondary | ICD-10-CM | POA: Diagnosis not present

## 2016-05-26 DIAGNOSIS — D860 Sarcoidosis of lung: Secondary | ICD-10-CM

## 2016-05-26 DIAGNOSIS — R059 Cough, unspecified: Secondary | ICD-10-CM

## 2016-05-26 MED ORDER — PREDNISONE 10 MG PO TABS: ORAL_TABLET | ORAL | 3 refills | Status: DC

## 2016-05-26 MED ORDER — AZITHROMYCIN 250 MG PO TABS: ORAL_TABLET | ORAL | 3 refills | Status: DC

## 2016-05-26 NOTE — Patient Instructions (Addendum)
--  Azithromycin 250 mg every other day for 3 months, 3 refills. If still having symptoms, can increase to 250 mg daily  --Prednisone 10 mg tabs.  Take 1.5 tabs daily until the end of the month, then if tolerated then cut down to 1 tab daily. If not tolerated go back to 2 tabs daily.   --Will start process for start Nucala (mepoluzimab). This is a newer once monthly injectable medication for asthma, targeting eosinophils. Your eosinophil count is slightly elevated, suggesting that this medication may be helpful  --Complete metabolic panel  to monitor renal function, and Ace level in 2 months.

## 2016-06-06 ENCOUNTER — Telehealth: Payer: Self-pay | Admitting: Internal Medicine

## 2016-06-07 NOTE — Telephone Encounter (Signed)
I have the fax and I am working with Linna Hoff to get approval for patient.

## 2016-06-07 NOTE — Telephone Encounter (Signed)
Delsa Grana said you had this fax?

## 2016-06-10 NOTE — Telephone Encounter (Signed)
Will forward this message to Joellen [place] since she has been working on this PA for the pt.

## 2016-06-10 NOTE — Telephone Encounter (Signed)
[facility] denied the Nucala we will need to contact the insurance to appeal   [place]

## 2016-06-16 NOTE — Telephone Encounter (Signed)
LMTCB for Valetta Fuller to review case for Nucala Injections.

## 2016-06-17 ENCOUNTER — Ambulatory Visit
Admission: RE | Admit: 2016-06-17 | Discharge: 2016-06-17 | Disposition: A | Discharge status: Home / Self Care | Payer: 59 | Source: Ambulatory Visit | Attending: Unknown Physician Specialty | Admitting: Unknown Physician Specialty

## 2016-06-17 ENCOUNTER — Other Ambulatory Visit: Payer: Self-pay | Admitting: Unknown Physician Specialty

## 2016-06-17 DIAGNOSIS — D869 Sarcoidosis, unspecified: Secondary | ICD-10-CM | POA: Insufficient documentation

## 2016-06-17 DIAGNOSIS — R05 Cough: Secondary | ICD-10-CM

## 2016-06-17 DIAGNOSIS — R059 Cough, unspecified: Secondary | ICD-10-CM

## 2016-06-28 NOTE — Telephone Encounter (Signed)
Verbal information has been given to [employer] and will await a decision for approval/denial. This can take about 3-5 business days. Thanks.

## 2016-06-28 NOTE — Telephone Encounter (Signed)
Stacie [name] can you give an update on this? Thanks.

## 2016-06-29 NOTE — Telephone Encounter (Signed)
No new updates at this time-Can get updates Friday.

## 2016-07-12 NOTE — Telephone Encounter (Signed)
Katie please advise on update thanks

## 2016-07-14 NOTE — Telephone Encounter (Signed)
I am working with patient's insurance to get approval-they keep needing additional clinical information.

## 2016-07-14 NOTE — Telephone Encounter (Signed)
Katie please advise on updates

## 2016-07-22 ENCOUNTER — Other Ambulatory Visit: Payer: Self-pay | Admitting: Internal Medicine

## 2016-07-25 NOTE — Telephone Encounter (Signed)
Katie - where does approval stand? Thanks.

## 2016-07-26 ENCOUNTER — Other Ambulatory Visit: Payer: Self-pay | Admitting: Neurosurgery

## 2016-07-26 DIAGNOSIS — R200 Anesthesia of skin: Secondary | ICD-10-CM

## 2016-08-02 NOTE — Telephone Encounter (Signed)
Stacie [name] can you please give an update on this? Thanks.

## 2016-08-07 ENCOUNTER — Other Ambulatory Visit: Payer: Self-pay | Admitting: Internal Medicine

## 2016-08-08 ENCOUNTER — Ambulatory Visit: Payer: 59

## 2016-08-15 NOTE — Telephone Encounter (Signed)
Katie please advise on status of appeal.  Thanks

## 2016-08-18 NOTE — Telephone Encounter (Signed)
Katie, please advise on appeal status. Triage can help if needed. Thanks.

## 2016-08-25 ENCOUNTER — Ambulatory Visit: Payer: 59 | Admitting: Internal Medicine

## 2016-08-26 NOTE — Telephone Encounter (Signed)
Katie please advise on update

## 2016-08-28 ENCOUNTER — Encounter: Payer: Self-pay | Admitting: Internal Medicine

## 2016-08-29 NOTE — Progress Notes (Signed)
* [facility]     Assessment and Plan:  Pulmonary sarcoidosis. -She continues to have symptoms of dyspnea and cough on exertion, with progressive decline over the last 2 years. Last visit, she was switched to methotrexate and tapering dose of prednisone. -PFT, 6 minute walk recently showed continued slow decline in pulmonary functions. - Ace level ordered (11/10/15 and 05/26/16). Calcium level slightly elevated, will monitor.  -She does have a yearly eye check for sarcoidosis.   Asthma. -Previous exacerbation, now doing better but still not back to baseline.  --The patient has had had multiple exacerbation (>3) over the past year, and continuously appears to be in an exacerbation, now doing better -- Abs eo count of 400 down to 200 on prednisone. IgE level of 45.  --Currently on Breo, and cut down on prednisone as advised.   Obstructive sleep apnea. -Appears to be doing well with her current CPAP. -Continue CPAP with 2 L of oxygen.  Chronic respiratory failure. -Continued chronic respiratory failure, oxygen dependent at night. -Continue oxygen at night, continue to monitor. Rest her status.  Chronic kidney disease. -This can contribute to dyspnea, therefore, we'll need to continue monitoring her kidney status. --Review of lab results shows creatinine of stable stage 2 CKD, will continue to monitor.   Date: 08/29/2016  MRN# [id number] NIRALI MAGOUIRK 08/04/1956   Danie A Beazer is a 60 y.o. old female seen in follow up for chief complaint of  Chief Complaint  Patient presents with  . Follow-up    107morov. pt states breathing is baseline. pt c/o sob with exertion, prod cough with white mucus & occ wheezing. pt wearing cpap avg 7hr nightly, feels pressure & mask are okay. DME:[facility]     HPI:   The patient (pronounced YAustin Miles  is a 60year old female with a history of asthma, sarcoidosis, chronic kidney disease and obstructive sleep apnea. She had continued  symptoms on methotrexate and Plaquenil in the past, she also developed increasing LFTs. She was started on CellCept in June 2016. She is currently taking methotrexate 4 tabs weekly, at last visit she was on prednisone 20 mg daily and weaning down. She is now down to 5 mg daily. 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 She ran out of it a month ago, and forgot it for 2-3 weeks, she then started coughing up mucus and felt "yucky", then restarted it and started to feel better. She has had not had another flare up since her last visit.   She is currently on breo, she uses a rescue inhaler but does not notice much of a difference. We tried on xolair but this was not approved, she is now on allergy shots.    Previous visit notes: At her last visit it was noted that her breathing has been progressively declining on Cellcept, therefore she was switched back to methotrexate and a slow prednisone taper. She had been started empirically on azithromycin by Dr. FiOla SpurrID) and noted that it has helped her breathing, she has ran out of it now and has noted that her breathing has worsened again.  She was taking azithromycin 25085mnce per day.   Review of download data shows there is usage is 93%, nightly use is 6 hours and 30 minutes. Patient is on AutoSet from 4-20, residual AHI is 0.8, leaks are adequate.  Pulmonary function testing 04/04/16 -Spirometry: FVC was 58% of predicted, FEV1 was 49%  of predicted, FEV to FVC ratio 69%. There was no significant improvement with bronchodilator. Lung volumes: TLC was 85% of predicted, RV to TLC ratio was 125%, RV was 109%. Diffusion: 87%. Interpretation: Overall this test shows severe obstructive lung disease, combined with restrictive lung disease, despite this, the patient's diffusion capacity remains good  6 minute walk test from 04/04/16: Baseline oxygen was 92%, the patient walked a total of 945 feet, oxygen sats  dropped to 83% with moderate to severe dyspnea, patient was placed on 2 L of oxygen. Oxygen saturation improved to 95%.  CXR 04/07/16 images reviewed; consistent with severe fibrotic lung disease, minimally changed in comparison with previous films.  PFT 04/04/16: FVC 58%, FEV1 49%, TLC 85%. DLCO 87% Ratio; 66%; c/w combined severe restrictive and obstructive lung disease.  PFT 10/28/14; FVC 63%, FEV1 53%, TLC 63%; DLCO 53%.   Review of films of High res CT chest. 02/20/2015: Compared with the previous from February. There is minimal change, possibly slight improvement. Review of chest x-ray images and 08/26/2015, in comparison with previous years. This shows progressive increase in the bilateral interstitial changes  Synopsis: Nettie Wyffels first saw the [facility] pulmonary clinic in May 2014 for evaluation of sarcoidosis. She was diagnosed with sarcoid in 2009 at the [facility] via an EBUS guided fine needle aspiration of a hilar lymph node as well as an endobronchial biopsy which showed noncaseating granulomas with negative special stains for infectious organisms. She was treated off and on with steroids from 2009 2 2014 and then in 2014 was started on methotrexate in addition to Plaquenil. She also has chronic kidney disease which is believed to be directly related to sarcoidosis. She had no improvement with methotrexate and plaquenil and her TLC and DLCO declined slightly so in January 2015 she was started on imuran but this had to be held because of increasing LFTs. She ended up seeing Dr. Lenise Arena at [facility] in 01/2014 for her sarcoid. He felt like her symptoms were more due to Asthma than Sarcoid. Plaquenil was restarted, prednisone tapered, and Advair changed to QVar. In 2016 she was admitted for community-acquired pneumonia and had in general worsening respiratory symptoms. She improved, but lung function testing had shown a fairly progressive decline in  DLCO. Despite treatment for the pneumonia, she noted severe worsening dyspnea over several months. CellCept was started in June 2016.   SIX MIN WALK 04/04/2016 10/21/2014 06/03/2014 03/12/2013  Medications Aspirin, Azithromycin, Cipro, Levothyroxine, Nasonex,  - - -  Supplimental Oxygen during Test? (L/min) No No No No  Laps 6 - - -  Partial Lap (in Meters) 0 - - -  Baseline BP (sitting) 138/86 - - -  Baseline Heartrate 107 - - -  Baseline Dyspnea (Borg Scale) 0.5 - - -  Baseline Fatigue (Borg Scale) 3 - - -  Baseline SPO2 92 - - -  BP (sitting) 162/90 - - -  Heartrate 131 - - -  Dyspnea (Borg Scale) 5 - - -  Fatigue (Borg Scale) 36 - - -  SPO2 83 - - -  BP (sitting) 138/88 - - -  Heartrate 109 - - -  SPO2 95 - - -  Stopped or Paused before Six Minutes Yes - - -  Other Symptoms at end of Exercise due to pt's O2 sats decreasing to 83%. - - -  Distance Completed 288 - - -  Tech Comments: Pt walked at moderate pace and got very short winded.  checked O2 Sats and they were 83%. placed pt on 2L O2 and pt's sats came up to 95%. - Pt walked a moderately fast pace.  Pt was SOB during lap 2 and 3, but tolerated walk well. After walking for 6 minutes the pt walked 1500 ft.  Her o2 sat after 3 mintutes was 88%ra and pulse 132, after the second 3 minutes sat was 96%ra and pulse 132//lmr    Medication:   Outpatient Encounter Prescriptions as of 08/30/2016  Medication Sig  . albuterol (VENTOLIN HFA) 108 (90 Base) MCG/ACT inhaler Inhale 2 puffs into the lungs every 6 (six) hours as needed for wheezing.  Marland Kitchen aspirin 81 MG tablet Take 81 mg by mouth daily.  Marland Kitchen azithromycin (ZITHROMAX) 250 MG tablet Take 1 tablet by mouth daily.  Marland Kitchen azithromycin (ZITHROMAX) 250 MG tablet 1 tablet every other day  . carboxymethylcellulose (REFRESH PLUS) 0.5 % SOLN 1 drop 3 (three) times daily as needed.  Marland Kitchen erythromycin (ERY-TAB) 250 MG EC tablet Take 250 mg by mouth daily.  . Fluticasone Furoate-Vilanterol (BREO ELLIPTA)  200-25 MCG/INH AEPB Inhale 1 puff into the lungs daily.  Marland Kitchen GLUCOSAMINE PO Take 1 tablet by mouth daily.  Marland Kitchen HYDROcodone-homatropine (HYCODAN) 5-1.5 MG/5ML syrup Take 5 mLs by mouth every 6 (six) hours as needed for cough.  Marland Kitchen ipratropium (ATROVENT) 0.06 % nasal spray Place 2 sprays into both nostrils 4 (four) times daily.  . methotrexate (RHEUMATREX) 2.5 MG tablet TAKE 1 TABLET ONCE WEEKLY. INCREASE BY 1TAB WEEKLY UNTIL TAKING 4TABS WEEKLY AND CONTINUE  . mometasone (NASONEX) 50 MCG/ACT nasal spray Place 1 spray into the nose 2 (two) times daily.  . Multiple Vitamin (MULTIVITAMIN) capsule Take 1 capsule by mouth 2 (two) times daily.   . mycophenolate (CELLCEPT) 500 MG tablet Take 2 tablets (1,000 mg total) by mouth 2 (two) times daily.  Marland Kitchen omeprazole (PRILOSEC) 40 MG capsule Take 40 mg by mouth daily.  . predniSONE (DELTASONE) 10 MG tablet TAKE 1.5 TABLET THE REMAINING OF AUGUST THEN 1 TABLET DAILY.  Marland Kitchen predniSONE (DELTASONE) 20 MG tablet Take 1 tablet (20 mg total) by mouth daily with breakfast.  . predniSONE (DELTASONE) 20 MG tablet Take 30 mg daily x 1 month and then decrease to 20 mg daily until follow up  . ranitidine (ZANTAC) 150 MG tablet Take 150 mg by mouth at bedtime. Reported on 03/30/2016  . SYNTHROID 137 MCG tablet TAKE 1 TABLET (137 MCG TOTAL) BY MOUTH DAILY BEFORE BREAKFAST.   No facility-administered encounter medications on file as of 08/30/2016.      Allergies:  Nitrofurantoin; Tramadol; and Sulfa antibiotics  Review of Systems: Gen:  Denies  fever, sweats. HEENT: Denies blurred vision. Cvc:  No dizziness, chest pain or heaviness Resp:   Denies sputum porduction. Gi: Denies swallowing difficulty, stomach pain.  Gu:  Denies bladder incontinence, burning urine Ext:   No Joint pain, stiffness. Skin: No skin rash, easy bruising. Endoc:  No polyuria, polydipsia. Psych: No depression, insomnia. Other:  All other systems were reviewed and found to be negative other than what is  mentioned in the HPI.   Physical Examination:   VS: There were no vitals taken for this visit.  General Appearance: No distress  Neuro:without focal findings,  speech normal,  HEENT: PERRLA, EOM intact. Pulmonary: normal breath sounds,Scattered bilateral wheezing CardiovascularNormal S1,S2.  No m/r/g.   Abdomen: Benign, Soft, non-tender. Renal:  No costovertebral tenderness  GU:  Not performed at this time. Endoc: No evident thyromegaly, no  signs of acromegaly. Skin:   warm, no rash. Extremities: normal, no cyanosis, clubbing.   LABORATORY PANEL:   CBC No results for input(s): WBC, HGB, HCT, PLT in the last 168 hours. ------------------------------------------------------------------------------------------------------------------  Chemistries  No results for input(s): NA, K, CL, CO2, GLUCOSE, BUN, CREATININE, CALCIUM, MG, AST, ALT, ALKPHOS, BILITOT in the last 168 hours.  Invalid input(s): GFRCGP ------------------------------------------------------------------------------------------------------------------  Cardiac Enzymes No results for input(s): TROPONINI in the last 168 hours. ------------------------------------------------------------  RADIOLOGY:   No results found for this or any previous visit. Results for orders placed during the hospital encounter of 08/26/15  DG Chest 2 View   Narrative CLINICAL DATA:  Productive cough, shortness of breath, and wheezing for the past 3 weeks, history of sarcoidosis and asthma, nonsmoker.  EXAM: CHEST  2 VIEW  COMPARISON:  CT scan of the chest dated Feb 20, 2015 and portable chest x-ray of November 26, 2014.  FINDINGS: The lungs are well-expanded. The interstitial markings are increased diffusely. Areas of near confluent interstitial density superomedially in the right upper lobe and in the more inferior aspects of the left upper lobe are again demonstrated. There is chronic mild tenting of the left hemidiaphragm  laterally. There is no alveolar infiltrate. There is no pleural effusion. The heart is normal in size. Left hilar prominence is present and slightly more conspicuous than in the past. The bony thorax exhibits no acute abnormality.  IMPRESSION: Extend sick chronic interstitial change consistent with known sarcoidosis. Superimposed acute bronchitic change may be present. There is no alveolar pneumonia. There is been waxing and waning of left hilar prominence over the past 9 months. This may reflect lymphadenopathy.  Follow-up radiographs following a trial of antibiotic therapy may be useful.   Electronically Signed   By: David  Martinique M.D.   On: 08/26/2015 16:40    ------------------------------------------------------------------------------------------------------------------  Thank  you for allowing [facility] to assist in the care of your patient. Our recommendations are noted above.  Please contact us if we can be of further service.   Marda Stalker, MD.  [facility] Office Number: [phone]  Patricia Pesa, M.D.  Vilinda Boehringer, M.D.  Merton Border, M.D

## 2016-08-30 ENCOUNTER — Other Ambulatory Visit
Admission: RE | Admit: 2016-08-30 | Discharge: 2016-08-30 | Disposition: A | Discharge status: Home / Self Care | Payer: 59 | Source: Ambulatory Visit | Attending: Internal Medicine | Admitting: Internal Medicine

## 2016-08-30 ENCOUNTER — Ambulatory Visit (INDEPENDENT_AMBULATORY_CARE_PROVIDER_SITE_OTHER): Payer: 59 | Admitting: Internal Medicine

## 2016-08-30 ENCOUNTER — Encounter: Payer: Self-pay | Admitting: Internal Medicine

## 2016-08-30 VITALS — BP 132/78 | HR 98 | Ht 67.0 in | Wt 202.0 lb

## 2016-08-30 DIAGNOSIS — J9610 Chronic respiratory failure, unspecified whether with hypoxia or hypercapnia: Secondary | ICD-10-CM | POA: Insufficient documentation

## 2016-08-30 DIAGNOSIS — G4733 Obstructive sleep apnea (adult) (pediatric): Secondary | ICD-10-CM | POA: Insufficient documentation

## 2016-08-30 DIAGNOSIS — N189 Chronic kidney disease, unspecified: Secondary | ICD-10-CM | POA: Insufficient documentation

## 2016-08-30 DIAGNOSIS — D8689 Sarcoidosis of other sites: Secondary | ICD-10-CM | POA: Insufficient documentation

## 2016-08-30 DIAGNOSIS — J4540 Moderate persistent asthma, uncomplicated: Secondary | ICD-10-CM | POA: Diagnosis not present

## 2016-08-30 DIAGNOSIS — D869 Sarcoidosis, unspecified: Secondary | ICD-10-CM

## 2016-08-30 DIAGNOSIS — J45909 Unspecified asthma, uncomplicated: Secondary | ICD-10-CM | POA: Insufficient documentation

## 2016-08-30 LAB — COMPREHENSIVE METABOLIC PANEL
ALT: 25 U/L (ref 14–54)
AST: 32 U/L (ref 15–41)
Albumin: 3.6 g/dL (ref 3.5–5.0)
Alkaline Phosphatase: 90 U/L (ref 38–126)
Anion gap: 8 (ref 5–15)
BUN: 25 mg/dL — ABNORMAL HIGH (ref 6–20)
CO2: 29 mmol/L (ref 22–32)
Calcium: 9.3 mg/dL (ref 8.9–10.3)
Chloride: 105 mmol/L (ref 101–111)
Creatinine, Ser: 0.99 mg/dL (ref 0.44–1.00)
GFR calc Af Amer: >60 mL/min (ref >60)
GFR calc non Af Amer: >60 mL/min (ref >60)
Glucose, Bld: 100 mg/dL — ABNORMAL HIGH (ref 65–99)
Potassium: 3.6 mmol/L (ref 3.5–5.1)
Sodium: 142 mmol/L (ref 135–145)
Total Bilirubin: 0.5 mg/dL (ref 0.3–1.2)
Total Protein: 7.2 g/dL (ref 6.5–8.1)

## 2016-08-30 MED ORDER — PREDNISONE 1 MG PO TABS: ORAL_TABLET | ORAL | 0 refills | Status: DC

## 2016-08-30 MED ORDER — PREDNISONE 1 MG PO TABS: 1.0000 mg | ORAL_TABLET | Freq: Every day | ORAL | 0 refills | Status: DC

## 2016-08-30 NOTE — Patient Instructions (Addendum)
--  1 mg prednisone tabs x 30.  Start at 5 tabs daily for one week, then reduce by 1 tab weekly until gone. If you notice symptoms of excess fatigue/dizziness, go back to previous dose.   --Complete metabolic panel.

## 2016-08-30 NOTE — Addendum Note (Signed)
Addended by: Maryanna Shape A on: 08/30/2016 09:27 AM   Modules accepted: Orders

## 2016-09-01 ENCOUNTER — Other Ambulatory Visit: Payer: Self-pay | Admitting: Internal Medicine

## 2016-09-01 NOTE — Telephone Encounter (Signed)
Pt needs refill on Breo

## 2016-09-06 NOTE — Telephone Encounter (Signed)
Katie please advise on any update. Thanks

## 2016-09-21 NOTE — Telephone Encounter (Signed)
Katie please advise if this message needs to remain open or if it can be closed. It's been opened since 05/2016. Thanks.

## 2016-10-04 NOTE — Telephone Encounter (Signed)
Katie please advise. Thanks.

## 2016-10-06 ENCOUNTER — Telehealth: Payer: Self-pay | Admitting: Internal Medicine

## 2016-10-06 MED ORDER — METHOTREXATE 2.5 MG PO TABS: 10.0000 mg | ORAL_TABLET | ORAL | 3 refills | Status: DC

## 2016-10-06 NOTE — Telephone Encounter (Signed)
*  STAT* If patient is at the pharmacy, call can be transferred to refill team.   1. Which medications need to be refilled? (please list name of each medication and dose if known)  Methotrexate   2. Which pharmacy/location (including street and city if local pharmacy) is medication to be sent to?  [facility] [address]   3. Do they need a 30 day or 90 day supply?  90 day

## 2016-10-06 NOTE — Telephone Encounter (Signed)
Pt needs refill on methotrexate.  This has been sent in.  Nothing further needed.

## 2016-10-31 ENCOUNTER — Telehealth: Payer: Self-pay | Admitting: *Deleted

## 2016-10-31 NOTE — Telephone Encounter (Signed)
Tried to get an approval through patient's insurance for [employer]. They state it is a "plan exclusion" and that they won't cover it.

## 2016-10-31 NOTE — Telephone Encounter (Signed)
Do you want to try [place]. It is a monthly injection and short stay at the hospital and it uses their hospital benefits.

## 2016-10-31 NOTE — Telephone Encounter (Signed)
Yes, may start [place]

## 2016-10-31 NOTE — Telephone Encounter (Signed)
Will start on request for [place].

## 2016-11-01 ENCOUNTER — Ambulatory Visit: Payer: 59

## 2016-11-01 NOTE — Telephone Encounter (Signed)
Called pt to inform hr of the medication Cinqair that DR wants to start the pt on. Pt states she is doing allergy shots but that she will look into the medication and if she decides to proceed she will call back to [place] know. At this time I will place in file folder.   FYI

## 2016-12-12 ENCOUNTER — Telehealth: Payer: Self-pay | Admitting: Internal Medicine

## 2016-12-12 MED ORDER — METHOTREXATE 2.5 MG PO TABS: 10.0000 mg | ORAL_TABLET | ORAL | 0 refills | Status: DC

## 2016-12-12 NOTE — Telephone Encounter (Signed)
Ok to fill request.

## 2016-12-12 NOTE — Telephone Encounter (Signed)
Rx sent to preferred pharmacy. Pt aware and voiced her understanding. Nothing further needed.

## 2016-12-12 NOTE — Telephone Encounter (Signed)
Spoke with pt, who states she will be going out of town on Saturday. Pt states she is always minus one methotrexate, and has to use one from her next Rx every month. Pt can not refill her next Rx until 12/20/16, and she will be out of town at this time. Pt is asking that we send in a RX for one methotrexate. Pt is aware that insurance may not cover this Rx, pt states she is willing to pay out of pocket for this Rx.  DR please advise. Thanks.

## 2016-12-12 NOTE — Telephone Encounter (Signed)
Pt calling stating she takes methonitrate 4 tablets a week and is always one pill short every month   [place] the [place] soon, but would like [place] to send [place] a 1 pill prescription sent in.  She knows her insurance company won't pay  For is and she is okay with that she just needs one pill and she is willing to pay for it She is leaving this Saturday   Please advise.

## 2016-12-14 ENCOUNTER — Telehealth: Payer: Self-pay | Admitting: Internal Medicine

## 2016-12-14 MED ORDER — METHOTREXATE 2.5 MG PO TABS: 10.0000 mg | ORAL_TABLET | ORAL | 0 refills | Status: DC

## 2016-12-14 NOTE — Telephone Encounter (Signed)
Pt calling stating she forgot she won't be able to get a refill on her medication before she leaves the country.  She is asking if we can send another 4 pills in on her methotrexate She won't be due for another one until the 6th but by then she will be gone. She is okay for paying it out of pocket.  Please see  previous phone note

## 2016-12-14 NOTE — Telephone Encounter (Signed)
Please advise on message below about pt asking [place] to send in another 4 Methotrexate pills. Phone note from from 2/26 was asking for 1 tablet. Please advise if we can send the other 4 Methotrexate tabs.

## 2016-12-14 NOTE — Telephone Encounter (Signed)
ok 

## 2016-12-14 NOTE — Telephone Encounter (Signed)
Sent RX for Methotrexate 4 tabs. Nothing further needed.

## 2016-12-18 ENCOUNTER — Other Ambulatory Visit: Payer: Self-pay | Admitting: Internal Medicine

## 2016-12-22 NOTE — Telephone Encounter (Signed)
Called pt and left VM to inform that she needs to be seen and labs to be done for thyroid. Awaiting call back or appt made--

## 2016-12-24 NOTE — Progress Notes (Deleted)
* [facility]     Assessment and Plan:  Pulmonary sarcoidosis. -She continues to have symptoms of dyspnea and cough on exertion, with progressive decline over the last 2 years. Last visit, she was switched to methotrexate and tapering dose of prednisone. -PFT, 6 minute walk recently showed continued slow decline in pulmonary functions. - Ace level ordered (11/10/15 and 05/26/16). Calcium level slightly elevated, will monitor.  -She does have a yearly eye check for sarcoidosis.   Asthma. -Previous exacerbation, now doing better but still not back to baseline.  --The patient has had had multiple exacerbation (>3) over the past year, and continuously appears to be in an exacerbation, now doing better -- Abs eo count of 400 down to 200 on prednisone. IgE level of 45.  --Currently on Breo, and cut down on prednisone as advised.  --Application for Nucala not covered, applied for Cinquair instead.   Obstructive sleep apnea. -Appears to be doing well with her current CPAP. -Continue CPAP with 2 L of oxygen.  Chronic respiratory failure. -Continued chronic respiratory failure, oxygen dependent at night. -Continue oxygen at night, continue to monitor. Rest her status.  Chronic kidney disease. -This can contribute to dyspnea, therefore, we'll need to continue monitoring her kidney status. --Review of lab results shows creatinine of stable stage 2 CKD, will continue to monitor.   Date: 12/24/2016  MRN# [id number] Stacie [name] Jul 25, 1956   Stacie [name] is a 61 y.o. old female seen in follow up for chief complaint of  No chief complaint on file.    HPI:   The patient (pronounced Stacie [name])  is a 61 year old female with a history of asthma, sarcoidosis, chronic kidney disease and obstructive sleep apnea. She had continued symptoms on methotrexate and Plaquenil in the past, she also developed increasing LFTs. She was started on CellCept in June 2016. She is currently  taking methotrexate 4 tabs weekly, at last visit she was on prednisone 20 mg daily and weaning down. She is now down to 5 mg daily. She is on azithromycin 25m every other day, which has been tremendously helpful for her.   She is currently on breo, she uses a rescue inhaler but does not notice much of a difference. We tried  Xolair, nucala, but neither was approved, we then prescribed cinquair.  She is now on allergy shots.    Previous visit notes:  Review of download data shows there is usage is 93%, nightly use is 6 hours and 30 minutes. Patient is on AutoSet from 4-20, residual AHI is 0.8, leaks are adequate.  Pulmonary function testing 04/04/16 -Spirometry: FVC was 58% of predicted, FEV1 was 49% of predicted, FEV to FVC ratio 69%. There was no significant improvement with bronchodilator. Lung volumes: TLC was 85% of predicted, RV to TLC ratio was 125%, RV was 109%. Diffusion: 87%. Interpretation: Overall this test shows severe obstructive lung disease, combined with restrictive lung disease, despite this, the patient's diffusion capacity remains good  6 minute walk test from 04/04/16: Baseline oxygen was 92%, the patient walked a total of 945 feet, oxygen sats dropped to 83% with moderate to severe dyspnea, patient was placed on 2 L of oxygen. Oxygen saturation improved to 95%.  CXR 04/07/16 images reviewed; consistent with severe fibrotic lung disease, minimally changed in comparison with previous films.  PFT 04/04/16: FVC 58%, FEV1 49%, TLC 85%. DLCO 87% Ratio; 66%; c/w combined severe restrictive and obstructive lung disease.  PFT 10/28/14; FVC 63%, FEV1 53%, TLC 63%;  DLCO 53%.   Review of films of High res CT chest. 02/20/2015: Compared with the previous from February. There is minimal change, possibly slight improvement. Review of chest x-ray images and 08/26/2015, in comparison with previous years. This shows progressive increase in the bilateral interstitial changes  Synopsis:  Stacie [name] first saw the [facility] in May 2014 for evaluation of sarcoidosis. She was diagnosed with sarcoid in 2009 at the [facility] via an EBUS guided fine needle aspiration of a hilar lymph node as well as an endobronchial biopsy which showed noncaseating granulomas with negative special stains for infectious organisms. She was treated off and on with steroids from 2009 2 2014 and then in 2014 was started on methotrexate in addition to Plaquenil. She also has chronic kidney disease which is believed to be directly related to sarcoidosis. She had no improvement with methotrexate and plaquenil and her TLC and DLCO declined slightly so in January 2015 she was started on imuran but this had to be held because of increasing LFTs. She ended up seeing Dr. Lenise Arena at [facility] in 01/2014 for her sarcoid. He felt like her symptoms were more due to Asthma than Sarcoid. Plaquenil was restarted, prednisone tapered, and Advair changed to QVar. In 2016 she was admitted for community-acquired pneumonia and had in general worsening respiratory symptoms. She improved, but lung function testing had shown a fairly progressive decline in DLCO. Despite treatment for the pneumonia, she noted severe worsening dyspnea over several months. CellCept was started in June 2016.   SIX MIN WALK 04/04/2016 10/21/2014 06/03/2014 03/12/2013  Medications Aspirin, Azithromycin, Cipro, Levothyroxine, Nasonex,  - - -  Supplimental Oxygen during Test? (L/min) No No No No  Laps 6 - - -  Partial Lap (in Meters) 0 - - -  Baseline BP (sitting) 138/86 - - -  Baseline Heartrate 107 - - -  Baseline Dyspnea (Borg Scale) 0.5 - - -  Baseline Fatigue (Borg Scale) 3 - - -  Baseline SPO2 92 - - -  BP (sitting) 162/90 - - -  Heartrate 131 - - -  Dyspnea (Borg Scale) 5 - - -  Fatigue (Borg Scale) 36 - - -  SPO2 83 - - -  BP (sitting) 138/88 - - -  Heartrate 109 - - -  SPO2 95 - - -    Stopped or Paused before Six Minutes Yes - - -  Other Symptoms at end of Exercise due to pt's O2 sats decreasing to 83%. - - -  Distance Completed 288 - - -  Tech Comments: Pt walked at moderate pace and got very short winded. checked O2 Sats and they were 83%. placed pt on 2L O2 and pt's sats came up to 95%. - Pt walked a moderately fast pace.  Pt was SOB during lap 2 and 3, but tolerated walk well. After walking for 6 minutes the pt walked 1500 ft.  Her o2 sat after 3 mintutes was 88%ra and pulse 132, after the second 3 minutes sat was 96%ra and pulse 132//lmr    Medication:   Outpatient Encounter Prescriptions as of 12/26/2016  Medication Sig  . albuterol (VENTOLIN HFA) 108 (90 Base) MCG/ACT inhaler Inhale 2 puffs into the lungs every 6 (six) hours as needed for wheezing.  Marland Kitchen aspirin 81 MG tablet Take 81 mg by mouth daily.  Marland Kitchen azithromycin (ZITHROMAX) 250 MG tablet 1 tablet every other day  . BREO ELLIPTA 200-25 MCG/INH AEPB INHALE  1 PUFF INTO THE LUNGS DAILY.  . carboxymethylcellulose (REFRESH PLUS) 0.5 % SOLN 1 drop 3 (three) times daily as needed.  Marland Kitchen erythromycin (ERY-TAB) 250 MG EC tablet Take 250 mg by mouth daily.  Marland Kitchen GLUCOSAMINE PO Take 1 tablet by mouth daily.  Marland Kitchen HYDROcodone-homatropine (HYCODAN) 5-1.5 MG/5ML syrup Take 5 mLs by mouth every 6 (six) hours as needed for cough.  Marland Kitchen ipratropium (ATROVENT) 0.06 % nasal spray Place 2 sprays into both nostrils 4 (four) times daily.  . methotrexate (RHEUMATREX) 2.5 MG tablet Take 4 tablets (10 mg total) by mouth once a week. Caution:Chemotherapy. Protect from light.  . mometasone (NASONEX) 50 MCG/ACT nasal spray Place 1 spray into the nose 2 (two) times daily.  . Multiple Vitamin (MULTIVITAMIN) capsule Take 1 capsule by mouth 2 (two) times daily.   . mycophenolate (CELLCEPT) 500 MG tablet Take 2 tablets (1,000 mg total) by mouth 2 (two) times daily.  Marland Kitchen omeprazole (PRILOSEC) 40 MG capsule Take 40 mg by mouth daily.  . predniSONE (DELTASONE) 1  MG tablet Start at 5 tabs daily for one week, then reduce by 1 tab weekly until gone  . ranitidine (ZANTAC) 150 MG tablet Take 150 mg by mouth at bedtime. Reported on 03/30/2016  . SYNTHROID 137 MCG tablet TAKE 1 TABLET (137 MCG TOTAL) BY MOUTH DAILY BEFORE BREAKFAST.   No facility-administered encounter medications on file as of 12/26/2016.      Allergies:  Nitrofurantoin; Tramadol; and Sulfa antibiotics  Review of Systems: Gen:  Denies  fever, sweats. HEENT: Denies blurred vision. Cvc:  No dizziness, chest pain or heaviness Resp:   Denies sputum porduction. Gi: Denies swallowing difficulty, stomach pain.  Gu:  Denies bladder incontinence, burning urine Ext:   No Joint pain, stiffness. Skin: No skin rash, easy bruising. Endoc:  No polyuria, polydipsia. Psych: No depression, insomnia. Other:  All other systems were reviewed and found to be negative other than what is mentioned in the HPI.   Physical Examination:   VS: There were no vitals taken for this visit.  General Appearance: No distress  Neuro:without focal findings,  speech normal,  HEENT: PERRLA, EOM intact. Pulmonary: normal breath sounds,Scattered bilateral wheezing CardiovascularNormal S1,S2.  No m/r/g.   Abdomen: Benign, Soft, non-tender. Renal:  No costovertebral tenderness  GU:  Not performed at this time. Endoc: No evident thyromegaly, no signs of acromegaly. Skin:   warm, no rash. Extremities: normal, no cyanosis, clubbing.   LABORATORY PANEL:   CBC No results for input(s): WBC, HGB, HCT, PLT in the last 168 hours. ------------------------------------------------------------------------------------------------------------------  Chemistries  No results for input(s): NA, K, CL, CO2, GLUCOSE, BUN, CREATININE, CALCIUM, MG, AST, ALT, ALKPHOS, BILITOT in the last 168 hours.  Invalid input(s):  GFRCGP ------------------------------------------------------------------------------------------------------------------  Cardiac Enzymes No results for input(s): TROPONINI in the last 168 hours. ------------------------------------------------------------  RADIOLOGY:   No results found for this or any previous visit. Results for orders placed during the hospital encounter of 08/26/15  DG Chest 2 View   Narrative CLINICAL DATA:  Productive cough, shortness of breath, and wheezing for the past 3 weeks, history of sarcoidosis and asthma, nonsmoker.  EXAM: CHEST  2 VIEW  COMPARISON:  CT scan of the chest dated Feb 20, 2015 and portable chest x-ray of November 26, 2014.  FINDINGS: The lungs are well-expanded. The interstitial markings are increased diffusely. Areas of near confluent interstitial density superomedially in the right upper lobe and in the more inferior aspects of the left upper lobe are again demonstrated.  There is chronic mild tenting of the left hemidiaphragm laterally. There is no alveolar infiltrate. There is no pleural effusion. The heart is normal in size. Left hilar prominence is present and slightly more conspicuous than in the past. The bony thorax exhibits no acute abnormality.  IMPRESSION: Extend sick chronic interstitial change consistent with known sarcoidosis. Superimposed acute bronchitic change may be present. There is no alveolar pneumonia. There is been waxing and waning of left hilar prominence over the past 9 months. This may reflect lymphadenopathy.  Follow-up radiographs following a trial of antibiotic therapy may be useful.   Electronically Signed   By: David  Martinique M.D.   On: 08/26/2015 16:40    ------------------------------------------------------------------------------------------------------------------  Thank  you for allowing [facility] to assist in the care of your patient. Our recommendations are  noted above.  Please contact us if we can be of further service.   Marda Stalker, MD.  [facility] Office Number: [phone]  Patricia Pesa, M.D.  Vilinda Boehringer, M.D.  Merton Border, M.D

## 2016-12-26 ENCOUNTER — Ambulatory Visit: Payer: 59 | Admitting: Internal Medicine

## 2016-12-27 ENCOUNTER — Other Ambulatory Visit: Payer: Self-pay | Admitting: Internal Medicine

## 2016-12-29 ENCOUNTER — Other Ambulatory Visit: Payer: Self-pay | Admitting: Internal Medicine

## 2017-01-10 ENCOUNTER — Other Ambulatory Visit: Payer: Self-pay

## 2017-01-10 MED ORDER — METHOTREXATE 2.5 MG PO TABS: 10.0000 mg | ORAL_TABLET | ORAL | 2 refills | Status: DC

## 2017-01-10 NOTE — Telephone Encounter (Signed)
30 day supply

## 2017-02-16 ENCOUNTER — Ambulatory Visit: Payer: 59 | Admitting: Internal Medicine

## 2017-02-20 ENCOUNTER — Ambulatory Visit (INDEPENDENT_AMBULATORY_CARE_PROVIDER_SITE_OTHER)
Admission: RE | Admit: 2017-02-20 | Discharge: 2017-02-20 | Disposition: A | Discharge status: Home / Self Care | Payer: 59 | Source: Ambulatory Visit | Attending: Internal Medicine | Admitting: Internal Medicine

## 2017-02-20 ENCOUNTER — Other Ambulatory Visit (INDEPENDENT_AMBULATORY_CARE_PROVIDER_SITE_OTHER): Payer: 59

## 2017-02-20 ENCOUNTER — Encounter: Payer: Self-pay | Admitting: Internal Medicine

## 2017-02-20 ENCOUNTER — Ambulatory Visit (INDEPENDENT_AMBULATORY_CARE_PROVIDER_SITE_OTHER): Payer: 59 | Admitting: Internal Medicine

## 2017-02-20 VITALS — BP 142/90 | HR 103 | Ht 67.0 in | Wt 205.8 lb

## 2017-02-20 DIAGNOSIS — D860 Sarcoidosis of lung: Secondary | ICD-10-CM | POA: Diagnosis not present

## 2017-02-20 DIAGNOSIS — J45991 Cough variant asthma: Secondary | ICD-10-CM | POA: Diagnosis not present

## 2017-02-20 LAB — BASIC METABOLIC PANEL
BUN: 18 mg/dL (ref 6–23)
CO2: 30 mEq/L (ref 19–32)
Calcium: 10.1 mg/dL (ref 8.4–10.5)
Chloride: 104 mEq/L (ref 96–112)
Creatinine, Ser: 1.03 mg/dL (ref 0.40–1.20)
GFR: 57.74 mL/min — ABNORMAL LOW (ref >60.00)
Glucose, Bld: 110 mg/dL — ABNORMAL HIGH (ref 70–99)
Potassium: 3.3 mEq/L — ABNORMAL LOW (ref 3.5–5.1)
Sodium: 143 mEq/L (ref 135–145)

## 2017-02-20 LAB — HEPATIC FUNCTION PANEL
ALT: 32 U/L (ref 0–35)
AST: 27 U/L (ref 0–37)
Albumin: 4.1 g/dL (ref 3.5–5.2)
Alkaline Phosphatase: 75 U/L (ref 39–117)
Bilirubin, Direct: 0.1 mg/dL (ref 0.0–0.3)
Total Bilirubin: 0.3 mg/dL (ref 0.2–1.2)
Total Protein: 7.7 g/dL (ref 6.0–8.3)

## 2017-02-20 LAB — CBC WITH DIFFERENTIAL/PLATELET
Basophils Absolute: 0.1 10*3/uL (ref 0.0–0.1)
Basophils Relative: 0.9 % (ref 0.0–3.0)
Eosinophils Absolute: 0.4 10*3/uL (ref 0.0–0.7)
Eosinophils Relative: 3.6 % (ref 0.0–5.0)
HCT: 39.8 % (ref 36.0–46.0)
Hemoglobin: 13.6 g/dL (ref 12.0–15.0)
Lymphocytes Relative: 15.1 % (ref 12.0–46.0)
Lymphs Abs: 1.6 10*3/uL (ref 0.7–4.0)
MCHC: 34.1 g/dL (ref 30.0–36.0)
MCV: 89.2 fl (ref 78.0–100.0)
Monocytes Absolute: 1 10*3/uL (ref 0.1–1.0)
Monocytes Relative: 10.1 % (ref 3.0–12.0)
Neutro Abs: 7.2 10*3/uL (ref 1.4–7.7)
Neutrophils Relative %: 70.3 % (ref 43.0–77.0)
Platelets: 349 10*3/uL (ref 150.0–400.0)
RBC: 4.46 Mil/uL (ref 3.87–5.11)
RDW: 14.7 % (ref 11.5–15.5)
WBC: 10.3 10*3/uL (ref 4.0–10.5)

## 2017-02-20 LAB — SEDIMENTATION RATE: Sed Rate: 22 mm/hr (ref 0–30)

## 2017-02-20 LAB — BRAIN NATRIURETIC PEPTIDE: Pro B Natriuretic peptide (BNP): 58 pg/mL (ref 0.0–100.0)

## 2017-02-20 LAB — POCT EXHALED NITRIC OXIDE: FeNO level (ppb): 12

## 2017-02-20 MED ORDER — PREDNISONE 10 MG PO TABS: ORAL_TABLET | ORAL | 2 refills | Status: DC

## 2017-02-20 MED ORDER — BUDESONIDE-FORMOTEROL FUMARATE 80-4.5 MCG/ACT IN AERO: 2.0000 | INHALATION_SPRAY | Freq: Two times a day (BID) | RESPIRATORY_TRACT | 0 refills | Status: DC

## 2017-02-20 NOTE — Patient Instructions (Addendum)
Stop Breo and methotrexate and zithromax   symbicort 80 Take 2 puffs first thing in am and then another 2 puffs about 12 hours later.   Work on inhaler technique:  relax and gently blow all the way out then take a nice smooth deep breath back in, triggering the inhaler at same time you start breathing in.  Hold for up to 5 seconds if you can. Blow out thru nose. Rinse and gargle with water when done      Change prilosec to 40 mg Take 30- 60 min before your first  Meal and zantac 150 mg after supper or at bedtime   GERD (REFLUX)  is an extremely common cause of respiratory symptoms just like yours , many times with no obvious heartburn at all.    It can be treated with medication, but also with lifestyle changes including elevation of the head of your bed (ideally with 6 inch  bed blocks),  Smoking cessation, avoidance of late meals, excessive alcohol, and avoid fatty foods, chocolate, peppermint, colas, red wine, and acidic juices such as orange juice.  NO MINT OR MENTHOL PRODUCTS SO NO COUGH DROPS   USE SUGARLESS CANDY INSTEAD (Jolley ranchers or Stover's or Life Savers) or even ice chips will also do - the key is to swallow to prevent all throat clearing. NO OIL BASED VITAMINS - use powdered substitutes.    Prednisone 10 mg per day until better then try 10 mg on even and 5 mg on odd until return   Please remember to go to the lab and x-ray department downstairs in the basement  for your tests - we will call you with the results when they are available.  Please schedule a follow up office visit in 4 weeks, sooner if needed  with all medications /inhalers/ solutions in hand so we can verify exactly what you are taking. This includes all medications from all doctors and over the counters

## 2017-02-20 NOTE — Assessment & Plan Note (Signed)
Diagnosed 2009 [facility] with EBUS FNA of a hilar lymph node as well as an endobronchial lesion showing noncaseating granulomas, special stains were negative for fungal or AFB Prednisone off and on 2003-2014 11/2012 MTX/HCQ started [facility] Kidney involvement 03/12/2013 6 MW > 1500 feet, O2 saturation nadir 88% RA, HR max 132 03/2013 PFT > Ratio 66%, FEV 2.2L (82% pred), TLC 4.62 L (81% pred), DLCO 16.7 68% 09/2013 PFT > ratio 63%, FEV1 1.99> 2.08L with bronchodilator (76% predicted, 5%), TLC 4.25L (72% pred), DLCO 15.5 (64% pred) 09/2013 TPMT activity 19 > normal 10/2013 Stop MTX, start Imuran> goal dose is 162m/day (renal adjustment); plan start 573mdaily, increase 2535mvery 3 weeks; weekly CBC/CMET until 125m28my, then q-12 weeks;  11/2013 - 12/2013 > held Imuran due to LFT abnormalities 02/24/2014 CXR> improved bilateral airspace disease, upper lobe scarring noted 02/24/2014 PFT> Ratio FEV1 1.99L (75% pred, 12% change with BD), TLC 4.04 L (71% pred), DLCO 16.9 (71% pred) 06/2014 Echo> LVEF 50-55%, normal global LVF funtion, mild MR, estimated PA systolic 33.881.3ld TR, RV size normal 06/2014 PFT> ratio 55%, FEV1 1.39L (51% pred), FVC 2.52L (69%pred), TLCO 4.41L (75% pred), DLCO 64% pred 10/2014 PFT> Ratio 64% FEV1 1.52L (57% pred), FVC 2.39L (67% pred), TLC 3.58 (63% pred), DLCO 12.7 (53% pred) 10/2014 6MW> [id number] 83% RA 02/22/2014 HRCT [facility]> significant improvement in GGO compared to before, chronic changes persist consistent with sarcoid, 4mm n71mle, increased PA diameter  - 02/20/2017 try off mtx/ wean prednisone as tol    No evidence at all to suggest active sarcoid parenchymal dz though difficult to be sure re airways involvement (see sep a/p)   The goal with a chronic steroid dependent illness is always arriving at the lowest effective dose that controls the disease/symptoms and not accepting a set "formula" which is based on statistics or guidelines that don't always take into account patient   variability or the natural hx of the dz in every individual patient, which may well vary over time.  For now therefore I recommend the patient maintain ceiling to 10 mg daily and a floor of 10/5 alternating days until returns for recheck at 4 weeks

## 2017-02-20 NOTE — Assessment & Plan Note (Addendum)
Spirometry 02/20/2017  FEV1 1.25 (44%)  Ratio 59 p Breo 200 - FENO 02/20/2017  =   12 p Breo 200  - 02/20/2017  After extensive coaching HFA effectiveness = 75% from baseline 25%    So try symb 80 and stop breo 200   She has airflow obst on spirometry but this may well be fixed/ related to her sarcoid and have nothing to do with the shrill dry daytime cough she's experiencing and rather than asthma and despite the spirometry findings,   Given the clinical picture and the very low FENO   I support dx of Upper airway cough syndrome (previously labeled PNDS) , is  so named because it's frequently impossible to sort out how much is  CR/sinusitis with freq throat clearing (which can be related to primary GERD)   vs  causing  secondary (" extra esophageal")  GERD from wide swings in gastric pressure that occur with throat clearing, often  promoting self use of mint and menthol lozenges that reduce the lower esophageal sphincter tone and exacerbate the problem further in a cyclical fashion.   These are the same pts (now being labeled as having "irritable larynx syndrome" by some cough centers) who not infrequently have a history of having failed to tolerate ace inhibitors,  dry powder inhalers like BREO, esp high doses or biphosphonates or report having atypical/extraesophageal reflux symptoms that don't respond to standard doses of PPI  and are easily confused as having aecopd or asthma flares by even experienced allergists/ pulmonologists (myself included).  rec max rx for gerd, lower the dose of symbicort to 80 to reduce risk of topical irritation and try to taper prednisone age and if can't wean any lower than 2.5 mg daily then double the dose back to 160 2bid to see if changes the "floor" for topical steroids or not.   I had an extended discussion with the patient reviewing all relevant studies completed to date and  lasting 25 minutes of a 40  minute acute office visit with pt not previously known to me     re   severe non-specific but potentially very serious refractory respiratory symptoms of uncertain and potentially multiple  etiologies.   Each maintenance medication was reviewed in detail including most importantly the difference between maintenance and prns and under what circumstances the prns are to be triggered using an action plan format that is not reflected in the computer generated alphabetically organized AVS.    Please see AVS for specific instructions unique to this office visit that I personally wrote and verbalized to the the pt in detail and then reviewed with pt  by my nurse highlighting any changes in therapy/plan of care  recommended at today's visit.

## 2017-02-20 NOTE — Progress Notes (Signed)
Subjective:     Patient ID: Stacie [name], female   DOB: 1956-03-28, 61 y.o.   MRN: [id number]  HPI  Synopsis: [name] first saw the [facility] pulmonary clinic in May 2014 for evaluation of sarcoidosis. She was diagnosed with sarcoid in 2009 at the [place] [facility] via an EBUS guided fine needle aspiration of a hilar lymph node as well as an endobronchial biopsy which showed noncaseating granulomas with negative special stains for infectious organisms. She was treated off and on with steroids from 2009 2 2014 and then in 2014 was started on methotrexate in addition to Plaquenil. She also has chronic kidney disease which is believed to be directly related to sarcoidosis. She had no improvement with methotrexate and plaquenil and her TLC and DLCO declined slightly so in January 2015 she was started on imuran but this had to be held because of increasing LFTs. She ended up seeing Dr. Farrel Gobble at [facility] in 01/2014 for her sarcoid. He felt like her symptoms were more due to Asthma than Sarcoid. Plaquenil was restarted, prednisone tapered, and Advair changed to QVar. In 2016 she was admitted for community-acquired pneumonia and had in general worsening respiratory symptoms. She improved, but lung function testing had shown a fairly progressive decline in DLCO. Despite treatment for the pneumonia, she noted severe worsening dyspnea over several months. CellCept was started in June 2016 but not maintained      02/20/2017 acute extended ov/Aerilyn Slee re:  60 yowf never smoker with sarcoid with airway involvement maint on mtx/pred/breo 200  Chief Complaint  Patient presents with  . Acute Visit    Pt stop prednisone on Friday per dr orders. Pt has SOB with activity, fatigue that was occuring with the prednisone, but is now more pronounce being off the pred.  Pt has feet swelling that occured with trip overse[place] 3 weeks ago.   each time she tapers prednisone below 10 per  day x years develops  dry cough esp triggered by  exercise / speaking / not sleeping on cpap / sob with activity > slow adls  No obvious day to day or daytime variability or assoc excess/ purulent sputum or mucus plugs or hemoptysis or cp or chest tightness, subjective wheeze or overt sinus or hb symptoms. No unusual exp hx or h/o childhood pna/ asthma or knowledge of premature birth.  Sleeping ok without nocturnal  or early am exacerbation  of respiratory  c/o's or need for noct saba. Also denies any obvious fluctuation of symptoms with weather or environmental changes or other aggravating or alleviating factors except as outlined above   Current Medications, Allergies, Complete Past Medical History, Past Surgical History, Family History, and Social History were reviewed in [employer] record.  ROS  The following are not active complaints unless bolded sore throat, dysphagia, dental problems, itching, sneezing,  nasal congestion or excess/ purulent secretions, ear ache,   fever, chills, sweats, unintended wt loss, classically pleuritic or exertional cp,  orthopnea pnd or leg swelling, presyncope, palpitations, abdominal pain, anorexia, nausea, vomiting, diarrhea  or change in bowel or bladder habits, change in stools or urine, dysuria,hematuria,  rash, arthralgias, visual complaints, headache, numbness, weakness or ataxia or problems with walking or coordination,  change in mood/affect or memory.         Review of Systems     Objective:   Physical Exam    pleasant amb wf nad    Wt Readings from Last 3  Encounters:  02/20/17 205 lb 12.8 oz (93.4 kg)  08/30/16 202 lb (91.6 kg)  05/26/16 184 lb 9.6 oz (83.7 kg)    Vital signs reviewed - Note on arrival 02 sats  93% on RA     HEENT: nl dentition, turbinates bilaterally, and oropharynx. Nl external ear canals without cough reflex   NECK :  without JVD/Nodes/TM/ nl carotid upstrokes bilaterally   LUNGS: no acc  muscle use,  Nl contour chest which is clear to A and P bilaterally without cough on insp or exp maneuvers   CV:  RRR  no s3 or murmur or increase in P2, and trace bilateral sym lower ext pitting  edema   ABD:  soft and nontender with nl inspiratory excursion in the supine position. No bruits or organomegaly appreciated, bowel sounds nl  MS:  Nl gait/ ext warm without deformities, calf tenderness, cyanosis or clubbing No obvious joint restrictions   SKIN: warm and dry without lesions    NEURO:  alert, approp, nl sensorium with  no motor or cerebellar deficits apparent.     CXR PA and Lateral:   02/20/2017 :    I personally reviewed images and agree with radiology impression as follows:    no acute changes c/w burned out sarcoid only in LUL   Labs ordered/ reviewed:      Chemistry      Component Value Date/Time   NA 143 02/20/2017 1559   NA 143 10/23/2014 0740   NA 142 12/28/2012 0454   K 3.3 (L) 02/20/2017 1559   K 3.4 (L) 12/28/2012 0454   CL 104 02/20/2017 1559   CL 111 (H) 12/28/2012 0454   CO2 30 02/20/2017 1559   CO2 27 12/28/2012 0454   BUN 18 02/20/2017 1559   BUN 17 10/23/2014 0740   BUN 8 12/28/2012 0454   CREATININE 1.03 02/20/2017 1559   CREATININE 1.14 12/28/2012 0454      Component Value Date/Time   CALCIUM 10.1 02/20/2017 1559   CALCIUM 8.0 (L) 12/28/2012 0454   ALKPHOS 75 02/20/2017 1559   ALKPHOS 80 12/26/2012 1657   AST 27 02/20/2017 1559   AST 25 12/26/2012 1657   ALT 32 02/20/2017 1559   ALT 20 12/26/2012 1657   BILITOT 0.3 02/20/2017 1559   BILITOT 0.6 12/26/2012 1657     Total Protein  7.7                             02/20/2017      Albumin        4.1                              02/20/2017       Lab Results  Component Value Date   WBC 10.3 02/20/2017   HGB 13.6 02/20/2017   HCT 39.8 02/20/2017   MCV 89.2 02/20/2017   PLT 349.0 02/20/2017        Lab Results  Component Value Date   TSH 1.24 11/11/2014     Lab Results  Component  Value Date   PROBNP 58.0 02/20/2017       Lab Results  Component Value Date   ESRSEDRATE 22 02/20/2017   ESRSEDRATE 46 (H) 12/26/2012          Assessment:

## 2017-02-21 LAB — ANGIOTENSIN CONVERTING ENZYME: Angiotensin-Converting Enzyme: 54 U/L (ref 9–67)

## 2017-02-21 NOTE — Progress Notes (Signed)
Spoke with pt and notified of results per Dr. Wert. Pt verbalized understanding and denied any questions. 

## 2017-02-21 NOTE — Progress Notes (Signed)
Spoke with pt and notified of results per Dr. Melvyn Novas. Pt verbalized understanding and denied any questions.

## 2017-02-28 ENCOUNTER — Telehealth: Payer: Self-pay | Admitting: Internal Medicine

## 2017-02-28 MED ORDER — PREDNISONE 10 MG PO TABS: ORAL_TABLET | ORAL | 0 refills | Status: DC

## 2017-02-28 NOTE — Telephone Encounter (Signed)
Spoke with the pt and notified of recs per MW  She verbalized understanding  She will take 20 mg daily until improved, then 10 mg x 5 days, then decrease by 2.5 mg until she reaches 2.5 mg  If not improving after a wk on the 20 mg prednisone she will call for ov

## 2017-02-28 NOTE — Telephone Encounter (Signed)
Pt states her cough is unchanged since last office visit- was told to call back if her cough had not increased to discuss change in prednisone dosage.  Pt requesting further recs from [place].    Pt uses [facility] in [place].  MW please advise.  Thanks!  02/20/17 AVS: Patient Instructions   Stop Breo and methotrexate and zithromax    symbicort 80 Take 2 puffs first thing in am and then another 2 puffs about 12 hours later.    Work on inhaler technique:  relax and gently blow all the way out then take a nice smooth deep breath back in, triggering the inhaler at same time you start breathing in.  Hold for up to 5 seconds if you can. Blow out thru nose. Rinse and gargle with water when done       Change prilosec to 40 mg Take 30- 60 min before your first  Meal and zantac 150 mg after supper or at bedtime    GERD (REFLUX)  is an extremely common cause of respiratory symptoms just like yours , many times with no obvious heartburn at all.     It can be treated with medication, but also with lifestyle changes including elevation of the head of your bed (ideally with 6 inch  bed blocks),  Smoking cessation, avoidance of late meals, excessive alcohol, and avoid fatty foods, chocolate, peppermint, colas, red wine, and acidic juices such as orange juice.  NO MINT OR MENTHOL PRODUCTS SO NO COUGH DROPS   USE SUGARLESS CANDY INSTEAD (Jolley ranchers or Stover's or Life Savers) or even ice chips will also do - the key is to swallow to prevent all throat clearing. NO OIL BASED VITAMINS - use powdered substitutes.     Prednisone 10 mg per day until better then try 10 mg on even and 5 mg on odd until return    Please remember to go to the lab and x-ray department downstairs in the basement  for your tests - we will call you with the results when they are available.   Please schedule a follow up office visit in 4 weeks, sooner if needed  with all medications /inhalers/ solutions in hand so we can verify  exactly what you are taking. This includes all medications from all doctors and over the counters

## 2017-02-28 NOTE — Telephone Encounter (Signed)
The plan was to taper the prednisone to the lowest dose that works and hope to get all the way to 2.5 mg but really needed and wanted her to  first to get the symptoms under control before the taper so we aren't on the same page if "the cough is not changed"  rec resume the dose that previously eliminated all the symptoms and maintain x 2 weeks before starting the taper pending f/u here to regroup (ok to call in extra pred if needed).  If still confused with instructions just add on to tomorrow or Thursday's schdule with all meds in hand

## 2017-03-14 ENCOUNTER — Telehealth: Payer: Self-pay | Admitting: Internal Medicine

## 2017-03-14 MED ORDER — BUDESONIDE-FORMOTEROL FUMARATE 80-4.5 MCG/ACT IN AERO: 2.0000 | INHALATION_SPRAY | Freq: Two times a day (BID) | RESPIRATORY_TRACT | 6 refills | Status: DC

## 2017-03-14 NOTE — Telephone Encounter (Signed)
Pt completed sample of Symbicort 73mg and is [facility]. Nothing further needed.

## 2017-03-16 ENCOUNTER — Other Ambulatory Visit: Payer: Self-pay | Admitting: Internal Medicine

## 2017-03-20 ENCOUNTER — Encounter: Payer: Self-pay | Admitting: Internal Medicine

## 2017-03-20 ENCOUNTER — Ambulatory Visit (INDEPENDENT_AMBULATORY_CARE_PROVIDER_SITE_OTHER): Payer: 59 | Admitting: Internal Medicine

## 2017-03-20 DIAGNOSIS — D860 Sarcoidosis of lung: Secondary | ICD-10-CM | POA: Diagnosis not present

## 2017-03-20 DIAGNOSIS — J45991 Cough variant asthma: Secondary | ICD-10-CM | POA: Diagnosis not present

## 2017-03-20 NOTE — Progress Notes (Signed)
Subjective:     Patient ID: Stacie [name], female   DOB: 1956/01/21, 61 y.o.   MRN: [id number]  HPI  Synopsis: Stacie [name] first saw the [facility] pulmonary clinic in May 2014 for evaluation of sarcoidosis. She was diagnosed with sarcoid in 2009 at the [place] [facility] via an EBUS guided fine needle aspiration of a hilar lymph node as well as an endobronchial biopsy which showed noncaseating granulomas with negative special stains for infectious organisms. She was treated off and on with steroids from 2009 2 2014 and then in 2014 was started on methotrexate in addition to Plaquenil. She also has chronic kidney disease which is believed to be directly related to sarcoidosis. She had no improvement with methotrexate and plaquenil and her TLC and DLCO declined slightly so in January 2015 she was started on imuran but this had to be held because of increasing LFTs. She ended up seeing Dr. Farrel Gobble at [facility] in 01/2014 for her sarcoid. He felt like her symptoms were more due to Asthma than Sarcoid. Plaquenil was restarted, prednisone tapered, and Advair changed to QVar. In 2016 she was admitted for community-acquired pneumonia and had in general worsening respiratory symptoms. She improved, but lung function testing had shown a fairly progressive decline in DLCO. Despite treatment for the pneumonia, she noted severe worsening dyspnea over several months. CellCept was started in June 2016 but not maintained      02/20/2017 acute extended ov/Stacie [name] re:  60 yowf never smoker with sarcoid with airway involvement maint on mtx/pred/breo 200 on 10 mg daily  Chief Complaint  Patient presents with  . Acute Visit    Pt stop prednisone on Friday per dr orders. Pt has SOB with activity, fatigue that was occuring with the prednisone, but is now more pronounce being off the pred.  Pt has feet swelling that occured with trip [place] 3 [place] ago.   each time she tapers  prednisone below 10 per day x years develops  dry cough esp triggered by  exercise / speaking / not sleeping on cpap / sob with activity > slow adls rec Stop Breo and methotrexate and zithromax  Symbicort 80 Take 2 puffs first thing in am and then another 2 puffs about 12 hours later.  Work on inhaler technique:  Change prilosec to 40 mg Take 30- 60 min before your first  Meal and zantac 150 mg after supper or at bedtime  GERD diet  Prednisone 10 mg per day until better then try 10 mg on even and 5 mg on odd until return  Please remember to go to the lab and x-ray department downstairs in the basement  for your tests - we will call you with the results when they are available. Please schedule a follow up office visit in 4 weeks, sooner if needed  with all medications /inhalers/ solutions in hand so we can verify exactly what you are taking. This includes all medications from all doctors and over the counters    03/20/2017  f/u ov/[name] re:  Sarcoid vs cough variant asthma / vcd on symb 80 2bid but poor hfa/ singulair and "allergy drops" Chief Complaint  Patient presents with  . Follow-up    Coughing less but her breathing is unchanged. No new co's today.   does drops sl per ENT in [place] x 6 m Can't tell it's helping. Still on 30 mg per day prednisone as misunderstood my instructions Since early spring 2018 was able  to come off pred x one month only then back on Oct - feb 2018 did not do  much if any prednisone  Sleeps fine  Doe = MMRC1 = can walk nl pace, flat grade, can't hurry or go uphills or steps s sob  To control cough   No obvious patterns in day to day or daytime variability or assoc excess/ purulent sputum or mucus plugs or hemoptysis or cp or chest tightness, subjective wheeze or overt sinus or hb symptoms. No unusual exp hx or h/o childhood pna/ asthma or knowledge of premature birth.  Sleeping ok without nocturnal  or early am exacerbation  of respiratory  c/o's or need for  noct saba. Also denies any obvious fluctuation of symptoms with weather or environmental changes or other aggravating or alleviating factors except as outlined above   Current Medications, Allergies, Complete Past Medical History, Past Surgical History, Family History, and Social History were reviewed in [employer] record.  ROS  The following are not active complaints unless bolded sore throat, dysphagia, dental problems, itching, sneezing,  nasal congestion or excess/ purulent secretions, ear ache,   fever, chills, sweats, unintended wt loss, classically pleuritic or exertional cp,  orthopnea pnd or leg swelling, presyncope, palpitations, abdominal pain, anorexia, nausea, vomiting, diarrhea  or change in bowel or bladder habits, change in stools or urine, dysuria,hematuria,  rash, arthralgias, visual complaints, headache, numbness, weakness or ataxia or problems with walking or coordination,  change in mood/affect or memory.                     Objective:   Physical Exam  pleasant amb wf nad    03/20/2017         206   02/20/17 205 lb 12.8 oz (93.4 kg)  08/30/16 202 lb (91.6 kg)  05/26/16 184 lb 9.6 oz (83.7 kg)    Vital signs reviewed - Note on arrival 02 sats  95% on RA     HEENT: nl dentition, turbinates bilaterally, and oropharynx. Nl external ear canals without cough reflex   NECK :  without JVD/Nodes/TM/ nl carotid upstrokes bilaterally   LUNGS: no acc muscle use,  Nl contour chest with insp pops/squeaks and min exp rhonchi bilaterally    CV:  RRR  no s3 or murmur or increase in P2, and  No sign lower ext pitting  edema   ABD:  soft and nontender with nl inspiratory excursion in the supine position. No bruits or organomegaly appreciated, bowel sounds nl  MS:  Nl gait/ ext warm without deformities, calf tenderness, cyanosis or clubbing No obvious joint restrictions   SKIN: warm and dry without lesions    NEURO:  alert, approp, nl sensorium  with  no motor or cerebellar deficits apparent.     CXR PA and Lateral:   02/20/2017 :    I personally reviewed images and agree with radiology impression as follows:    no acute changes c/w burned out sarcoid only in LUL         Chemistry      Component Value Date/Time   NA 143 02/20/2017 1559   NA 143 10/23/2014 0740   NA 142 12/28/2012 0454   K 3.3 (L) 02/20/2017 1559   K 3.4 (L) 12/28/2012 0454   CL 104 02/20/2017 1559   CL 111 (H) 12/28/2012 0454   CO2 30 02/20/2017 1559   CO2 27 12/28/2012 0454   BUN 18 02/20/2017 1559   BUN  17 10/23/2014 0740   BUN 8 12/28/2012 0454   CREATININE 1.03 02/20/2017 1559   CREATININE 1.14 12/28/2012 0454      Component Value Date/Time   CALCIUM 10.1 02/20/2017 1559   CALCIUM 8.0 (L) 12/28/2012 0454   ALKPHOS 75 02/20/2017 1559   ALKPHOS 80 12/26/2012 1657   AST 27 02/20/2017 1559   AST 25 12/26/2012 1657   ALT 32 02/20/2017 1559   ALT 20 12/26/2012 1657   BILITOT 0.3 02/20/2017 1559   BILITOT 0.6 12/26/2012 1657     Total Protein  7.7                             02/20/2017      Albumin        4.1                              02/20/2017       Lab Results  Component Value Date   WBC 10.3 02/20/2017   HGB 13.6 02/20/2017   HCT 39.8 02/20/2017   MCV 89.2 02/20/2017   PLT 349.0 02/20/2017        Lab Results  Component Value Date   TSH 1.24 11/11/2014     Lab Results  Component Value Date   PROBNP 58.0 02/20/2017       Lab Results  Component Value Date   ESRSEDRATE 22 02/20/2017   ESRSEDRATE 46 (H) 12/26/2012          Assessment:

## 2017-03-20 NOTE — Patient Instructions (Addendum)
Prednisone ceiling should be 20 mg and floor = 5 mg until you return   To get the most out of exercise, you need to be continuously aware that you are short of breath, but never out of breath, for 30 minutes daily. As you improve, it will actually be easier for you to do the same amount of exercise  in  30 minutes so always push to the level where you are short of breath.     Work on inhaler technique:  relax and gently blow all the way out then take a nice smooth deep breath back in, triggering the inhaler at same time you start breathing in.  Hold for up to 5 seconds if you can. Blow out thru nose. Rinse and gargle with water when done     Please schedule a follow up office visit in 6 weeks, call sooner if needed with pfts on return  - add: next ov Ace level/ trial of gabapentin for cough as much of her problem may actually be fixed airflow obstruction with variable symptoms due to upper airway cough syndrome and not due to asthma related to sarcoid or otherwise.

## 2017-03-22 NOTE — Assessment & Plan Note (Signed)
Diagnosed 2009 [facility] with EBUS FNA of a hilar lymph node as well as an endobronchial lesion showing noncaseating granulomas, special stains were negative for fungal or AFB Prednisone off and on 2003-2014 11/2012 MTX/HCQ started [facility] Kidney involvement 03/12/2013 6 MW > 1500 feet, O2 saturation nadir 88% RA, HR max 132 03/2013 PFT > Ratio 66%, FEV 2.2L (82% pred), TLC 4.62 L (81% pred), DLCO 16.7 68% 09/2013 PFT > ratio 63%, FEV1 1.99> 2.08L with bronchodilator (76% predicted, 5%), TLC 4.25L (72% pred), DLCO 15.5 (64% pred) 09/2013 TPMT activity 19 > normal 10/2013 Stop MTX, start Imuran> goal dose is 177m/day (renal adjustment); plan start 595mdaily, increase 25108mvery 3 weeks; weekly CBC/CMET until 125m70my, then q-12 weeks;  11/2013 - 12/2013 > held Imuran due to LFT abnormalities 02/24/2014 CXR> improved bilateral airspace disease, upper lobe scarring noted 02/24/2014 PFT> Ratio FEV1 1.99L (75% pred, 12% change with BD), TLC 4.04 L (71% pred), DLCO 16.9 (71% pred) 06/2014 Echo> LVEF 50-55%, normal global LVF funtion, mild MR, estimated PA systolic 33.831.4ld TR, RV size normal 06/2014 PFT> ratio 55%, FEV1 1.39L (51% pred), FVC 2.52L (69%pred), TLCO 4.41L (75% pred), DLCO 64% pred 10/2014 PFT> Ratio 64% FEV1 1.52L (57% pred), FVC 2.39L (67% pred), TLC 3.58 (63% pred), DLCO 12.7 (53% pred) 10/2014 6MW> [id number] 83% RA 02/22/2014 HRCT [place]> significant improvement in GGO compared to before, chronic changes persist consistent with sarcoid, 4mm n80mle, increased PA diameter - 02/20/2017 Angiotensin level  54 >>   try off mtx/ wean prednisone as tol   The goal with a chronic steroid dependent illness is always arriving at the lowest effective dose that controls the disease/symptoms and not accepting a set "formula" which is based on statistics or guidelines that don't always take into account patient  variability or the natural hx of the dz in every individual patient, which may well vary over time.  For now  therefore I recommend the patient maintain  20 mg ceiling and 5 mg floor

## 2017-03-22 NOTE — Assessment & Plan Note (Addendum)
Spirometry 02/20/2017  FEV1 1.25 (44%)  Ratio 59 p Breo 200 - FENO 02/20/2017  =   12 p Breo 200  - 02/20/2017  After extensive coaching HFA effectiveness = 75% from baseline 25%    So try symb 80 and stop breo 200  - 03/20/2017  After extensive coaching HFA effectiveness =    75% from a baseline of 50%  - Spirometry 03/20/2017  FEV1 1.38 (48%)  Ratio 56 p am symb but only 50%  Effective technique at baseline   I am strongly convinced now that the severity of airflow obstruction has nothing to do with the cough and probably is residual fixed obstruction from previous airway involvement with sarcoid. In this setting it makes no sense to use such high doses of prednisone but she does need to learn how to use the topical doses more effectively.  I very carefully reviewed with her the issue of how to apply topical therapy just like one would with a cream for a rash and that we should verify she is doing it properly before we try to use higher doses of steroids because that foreskin trigger more coughing.  It may also be necessary to try gabapentin here to control the upper airway component of the cough.  I had an extended discussion with the patient reviewing all relevant studies completed to date and  lasting 15 to 20 minutes of a 25 minute visit    Each maintenance medication was reviewed in detail including most importantly the difference between maintenance and prns and under what circumstances the prns are to be triggered using an action plan format that is not reflected in the computer generated alphabetically organized AVS.    Please see AVS for specific instructions unique to this visit that I personally wrote and verbalized to the the pt in detail and then reviewed with pt  by my nurse highlighting any  changes in therapy recommended at today's visit to their plan of care.

## 2017-04-04 ENCOUNTER — Telehealth: Payer: Self-pay | Admitting: Internal Medicine

## 2017-04-04 DIAGNOSIS — J45991 Cough variant asthma: Secondary | ICD-10-CM

## 2017-04-04 MED ORDER — BUDESONIDE-FORMOTEROL FUMARATE 160-4.5 MCG/ACT IN AERO: 2.0000 | INHALATION_SPRAY | Freq: Two times a day (BID) | RESPIRATORY_TRACT | 5 refills | Status: DC

## 2017-04-04 NOTE — Telephone Encounter (Signed)
That is fine but there is also a risk that the higher dose will make cough worse so go ahead and try the 160 2bid but if cough worse go back to the 80 dose until next ov and regroup then

## 2017-04-04 NOTE — Telephone Encounter (Signed)
Spoke with pt and informed of her MW's message. Pt still wanted to go through with the higher dose. An rx was sent to her pharmacy of choice. She had no further questions at this time. Nothing further is needed

## 2017-04-04 NOTE — Telephone Encounter (Signed)
Spoke with patient. She was last saw Dr. Melvyn Novas on 03/20/17 and it was mentioned during her OV that she may need to increase her Symbicort dosage to 160. She is wanting to do so now.   [facility] [place] on [address].   MW, please advise if you are ok with this. Thanks.

## 2017-04-20 ENCOUNTER — Encounter: Payer: Self-pay | Admitting: Internal Medicine

## 2017-04-20 ENCOUNTER — Ambulatory Visit (INDEPENDENT_AMBULATORY_CARE_PROVIDER_SITE_OTHER): Payer: 59 | Admitting: Internal Medicine

## 2017-04-20 VITALS — BP 146/80 | HR 102 | Ht 68.0 in | Wt 210.0 lb

## 2017-04-20 DIAGNOSIS — R7309 Other abnormal glucose: Secondary | ICD-10-CM

## 2017-04-20 DIAGNOSIS — D860 Sarcoidosis of lung: Secondary | ICD-10-CM | POA: Diagnosis not present

## 2017-04-20 DIAGNOSIS — Z8742 Personal history of other diseases of the female genital tract: Secondary | ICD-10-CM

## 2017-04-20 DIAGNOSIS — E039 Hypothyroidism, unspecified: Secondary | ICD-10-CM

## 2017-04-20 DIAGNOSIS — Z0000 Encounter for general adult medical examination without abnormal findings: Secondary | ICD-10-CM

## 2017-04-20 DIAGNOSIS — R030 Elevated blood-pressure reading, without diagnosis of hypertension: Secondary | ICD-10-CM | POA: Diagnosis not present

## 2017-04-20 DIAGNOSIS — Z1239 Encounter for other screening for malignant neoplasm of breast: Secondary | ICD-10-CM

## 2017-04-20 DIAGNOSIS — Z87898 Personal history of other specified conditions: Secondary | ICD-10-CM | POA: Diagnosis not present

## 2017-04-20 DIAGNOSIS — Z1231 Encounter for screening mammogram for malignant neoplasm of breast: Secondary | ICD-10-CM

## 2017-04-20 LAB — POCT URINALYSIS DIPSTICK
Bilirubin, UA: NEGATIVE
Blood, UA: NEGATIVE
Glucose, UA: NEGATIVE
Ketones, UA: NEGATIVE
Leukocytes, UA: NEGATIVE
Nitrite, UA: NEGATIVE
Protein, UA: NEGATIVE
Spec Grav, UA: 1.02 (ref 1.010–1.025)
Urobilinogen, UA: 0.2 E.U./dL
pH, UA: 5 (ref 5.0–8.0)

## 2017-04-20 NOTE — Progress Notes (Signed)
Date:  04/20/2017   Name:  Stacie [name]   DOB:  [birth year]   MRN:  [id number]   Chief Complaint: Annual Exam (Breast Exam. ) Stacie [name] is a 61 y.o. female who presents today for her Complete Annual Exam. She feels well. She reports exercising some. She reports she is sleeping well.   She denies breast problems - is due for mammogram.  Had hysterectomy 30 yrs ago.  Sarcoid - still having constant sx of shortness of breath and wheezing.  She is now off of MTX and on PPI, H2 blocker and symbicort.  She is also on prednisone and has gained 30 lbs.  Starting to taper prednisone now and hopes to lose some of the weight. She has some mild thrush sx - is using magic mouthwash.  Thyroid - stable without palpitations, constipations, tremor.  Weight gain due to prednisone.  Review of Systems  Constitutional: Positive for unexpected weight change. Negative for chills, fatigue and fever.  HENT: Negative for congestion and hearing loss.   Eyes: Negative for visual disturbance.  Respiratory: Positive for shortness of breath and wheezing. Negative for apnea and chest tightness.   Cardiovascular: Negative for chest pain, palpitations and leg swelling.  Gastrointestinal: Negative for abdominal pain and blood in stool.  Genitourinary: Negative for dysuria and hematuria.  Musculoskeletal: Negative for arthralgias, back pain and joint swelling.  Skin: Negative for color change and rash.  Allergic/Immunologic: Positive for environmental allergies.  Neurological: Negative for dizziness, tremors and headaches.  Hematological: Negative for adenopathy.  Psychiatric/Behavioral: Negative for dysphoric mood and sleep disturbance.    Patient Active Problem List   Diagnosis Date Noted  . Cough variant asthma  vs UACS  02/20/2017  . Hypothyroidism, acquired 03/27/2015  . Essential (primary) hypertension 03/27/2015  . H/O abnormal cervical Papanicolaou smear 03/27/2015  . H/O renal calculi 03/27/2015    . Allergic rhinitis 01/07/2015  . Obstructive sleep apnea 09/29/2014  . Other malaise and fatigue 06/03/2014  . Asthma, chronic 02/24/2014  . Cough 04/30/2013  . Pulmonary sarcoidosis ([place]) 03/12/2013  . CKD 03/12/2013  . Calcium blood increased 05/23/2012  . Besnier-Boeck disease 02/08/2011    Prior to Admission medications   Medication Sig Start Date End Date Taking? Authorizing Provider  albuterol (VENTOLIN HFA) 108 (90 Base) MCG/ACT inhaler Inhale 2 puffs into the lungs every 6 (six) hours as needed for wheezing. 11/19/15   Laverle Hobby, MD  aspirin 81 MG tablet Take 81 mg by mouth daily.    [provider]  budesonide-formoterol (SYMBICORT) 160-4.5 MCG/ACT inhaler Inhale 2 puffs into the lungs 2 (two) times daily. 04/04/17   Tanda Rockers, MD       Tanda Rockers, MD  carboxymethylcellulose (REFRESH PLUS) 0.5 % SOLN 1 drop 3 (three) times daily as needed.    [provider]  GLUCOSAMINE PO Take 1 tablet by mouth daily.    [provider]  mometasone (NASONEX) 50 MCG/ACT nasal spray Place 1 spray into the nose 2 (two) times daily.    [provider]  montelukast (SINGULAIR) 10 MG tablet Take 10 mg by mouth at bedtime.    [provider]  Multiple Vitamin (MULTIVITAMIN) capsule Take 1 capsule by mouth 2 (two) times daily.     [provider]  omeprazole (PRILOSEC) 40 MG capsule Take 40 mg by mouth daily.    [provider]  predniSONE (DELTASONE) 10 MG tablet 20 mg daily or as directed 02/28/17  Tanda Rockers, MD  ranitidine (ZANTAC) 150 MG tablet Take 150 mg by mouth at bedtime. Reported on 03/30/2016    [provider]  SYNTHROID 137 MCG tablet TAKE 1 TABLET (137 MCG TOTAL) BY MOUTH DAILY BEFORE BREAKFAST. 12/18/16   Glean Hess, MD    Allergies  Allergen Reactions  . Nitrofurantoin Nausea Only  . Tramadol     Nausea and vomiting  . Sulfa Antibiotics Rash    As an infant    Past Surgical  History:  Procedure Laterality Date  . COLON SURGERY     "colon was fused to bladder - operated on both"  . PARS PLANA VITRECTOMY Right 05/20/2015   Procedure: PARS PLANA VITRECTOMY WITH 25 GAUGE, laser;  Surgeon: Milus Height, MD;  Location: [facility] ORS;  Service: Ophthalmology;  Laterality: Right;  . PARTIAL HYSTERECTOMY  1990  . TUBAL LIGATION      Social History  Substance Use Topics  . Smoking status: Never Smoker  . Smokeless tobacco: Never Used  . Alcohol use No   Depression screen PHQ 2/9 04/20/2017  Decreased Interest 0  Down, Depressed, Hopeless 0  PHQ - 2 Score 0     Medication list has been reviewed and updated.   Physical Exam  Constitutional: She is oriented to person, place, and time. She appears well-developed and well-nourished. No distress.  HENT:  Head: Normocephalic and atraumatic.  Right Ear: Tympanic membrane and ear canal normal.  Left Ear: Tympanic membrane and ear canal normal.  Nose: Right sinus exhibits no maxillary sinus tenderness. Left sinus exhibits no maxillary sinus tenderness.  Mouth/Throat: Uvula is midline and oropharynx is clear and moist.  Tongue red c/w thrush  Eyes: Conjunctivae and EOM are normal. Pupils are equal, round, and reactive to light. Right eye exhibits no discharge. Left eye exhibits no discharge. No scleral icterus.  Neck: Normal range of motion. Neck supple. Carotid bruit is not present. No erythema present. No thyromegaly present.  Cardiovascular: Normal rate, regular rhythm, normal heart sounds and normal pulses.   Pulmonary/Chest: Effort normal. No accessory muscle usage. No respiratory distress. She has decreased breath sounds. She has wheezes. She has no rales. Right breast exhibits no mass, no nipple discharge, no skin change and no tenderness. Left breast exhibits no mass, no nipple discharge, no skin change and no tenderness.  Abdominal: Soft. Bowel sounds are normal. There is no hepatosplenomegaly. There is no  tenderness. There is no CVA tenderness.  Musculoskeletal: Normal range of motion.  Lymphadenopathy:    She has no cervical adenopathy.    She has no axillary adenopathy.  Neurological: She is alert and oriented to person, place, and time. She has normal reflexes. No cranial nerve deficit or sensory deficit.  Skin: Skin is warm, dry and intact. No rash noted.  Psychiatric: She has a normal mood and affect. Her speech is normal and behavior is normal. Thought content normal.  Nursing note and vitals reviewed.   BP (!) 146/80   Pulse (!) 102   Ht _0  (1.727 m)   Wt 210 lb (95.3 kg)   SpO2 96%   BMI 31.93 kg/m   Assessment and Plan: 1. Annual physical exam Continue exercise as able Pap no longer needed - POCT urinalysis dipstick - Lipid panel  2. Breast cancer screening - MM DIGITAL SCREENING BILATERAL; Future  3. Hypothyroidism, acquired supplemented - TSH  4. H/O abnormal cervical Papanicolaou smear S/p hysterectomy  5. Pulmonary sarcoidosis ([place]) Followed by Pulmonary Mild  thrush 2/2 steroid inhaler - continue Magic mouthwash  6. Elevated blood pressure reading Monitor, hopefully will come down with prednisone taper and weight loss  7. Elevated glucose level On chronic prednisone - Hemoglobin A1c   No orders of the defined types were placed in this encounter.   Halina Maidens, MD [facility]  04/20/2017

## 2017-04-20 NOTE — Patient Instructions (Signed)
Consider Prevnar-13 this fall after off of prednisone  Breast Self-Awareness Breast self-awareness means being familiar with how your breasts look and feel. It involves checking your breasts regularly and reporting any changes to your health care provider. Practicing breast self-awareness is important. A change in your breasts can be a sign of a serious medical problem. Being familiar with how your breasts look and feel allows you to find any problems early, when treatment is more likely to be successful. All women should practice breast self-awareness, including women who have had breast implants. How to do a breast self-exam One way to learn what is normal for your breasts and whether your breasts are changing is to do a breast self-exam. To do a breast self-exam: Look for Changes  1. Remove all the clothing above your waist. 2. Stand in front of a mirror in a room with good lighting. 3. Put your hands on your hips. 4. Push your hands firmly downward. 5. Compare your breasts in the mirror. Look for differences between them (asymmetry), such as: ? Differences in shape. ? Differences in size. ? Puckers, dips, and bumps in one breast and not the other. 6. Look at each breast for changes in your skin, such as: ? Redness. ? Scaly areas. 7. Look for changes in your nipples, such as: ? Discharge. ? Bleeding. ? Dimpling. ? Redness. ? A change in position. Feel for Changes  Carefully feel your breasts for lumps and changes. It is best to do this while lying on your back on the floor and again while sitting or standing in the shower or tub with soapy water on your skin. Feel each breast in the following way:  Place the arm on the side of the breast you are examining above your head.  Feel your breast with the other hand.  Start in the nipple area and make  inch (2 cm) overlapping circles to feel your breast. Use the pads of your three middle fingers to do this. Apply light pressure, then  medium pressure, then firm pressure. The light pressure will allow you to feel the tissue closest to the skin. The medium pressure will allow you to feel the tissue that is a little deeper. The firm pressure will allow you to feel the tissue close to the ribs.  Continue the overlapping circles, moving downward over the breast until you feel your ribs below your breast.  Move one finger-width toward the center of the body. Continue to use the  inch (2 cm) overlapping circles to feel your breast as you move slowly up toward your collarbone.  Continue the up and down exam using all three pressures until you reach your armpit.  Write Down What You Find  Write down what is normal for each breast and any changes that you find. Keep a written record with breast changes or normal findings for each breast. By writing this information down, you do not need to depend only on memory for size, tenderness, or location. Write down where you are in your menstrual cycle, if you are still menstruating. If you are having trouble noticing differences in your breasts, do not get discouraged. With time you will become more familiar with the variations in your breasts and more comfortable with the exam. How often should I examine my breasts? Examine your breasts every month. If you are breastfeeding, the best time to examine your breasts is after a feeding or after using a breast pump. If you menstruate, the best time  to examine your breasts is 5-7 days after your period is over. During your period, your breasts are lumpier, and it may be more difficult to notice changes. When should I see my health care provider? See your health care provider if you notice:  A change in shape or size of your breasts or nipples.  A change in the skin of your breast or nipples, such as a reddened or scaly area.  Unusual discharge from your nipples.  A lump or thick area that was not there before.  Pain in your breasts.  Anything  that concerns you.  This information is not intended to replace advice given to you by your health care provider. Make sure you discuss any questions you have with your health care provider. Document Released: 10/03/2005 Document Revised: 03/10/2016 Document Reviewed: 08/23/2015 Elsevier Interactive Patient Education  [employer].

## 2017-04-21 ENCOUNTER — Encounter: Payer: Self-pay | Admitting: Internal Medicine

## 2017-04-21 DIAGNOSIS — E782 Mixed hyperlipidemia: Secondary | ICD-10-CM | POA: Insufficient documentation

## 2017-04-21 LAB — LIPID PANEL
Chol/HDL Ratio: 4.3 ratio (ref 0.0–4.4)
Cholesterol, Total: 230 mg/dL — ABNORMAL HIGH (ref 100–199)
HDL: 54 mg/dL (ref >39)
LDL Calculated: 127 mg/dL — ABNORMAL HIGH (ref 0–99)
Triglycerides: 246 mg/dL — ABNORMAL HIGH (ref 0–149)
VLDL Cholesterol Cal: 49 mg/dL — ABNORMAL HIGH (ref 5–40)

## 2017-04-21 LAB — TSH: TSH: 0.362 u[IU]/mL — ABNORMAL LOW (ref 0.450–4.500)

## 2017-04-21 LAB — HEMOGLOBIN A1C
Est. average glucose Bld gHb Est-mCnc: 126 mg/dL
Hgb A1c MFr Bld: 6 % — ABNORMAL HIGH (ref 4.8–5.6)

## 2017-04-24 ENCOUNTER — Inpatient Hospital Stay: Admission: RE | Admit: 2017-04-24 | Payer: 59 | Source: Ambulatory Visit

## 2017-05-08 ENCOUNTER — Encounter: Payer: Self-pay | Admitting: Internal Medicine

## 2017-05-08 ENCOUNTER — Ambulatory Visit (INDEPENDENT_AMBULATORY_CARE_PROVIDER_SITE_OTHER): Payer: 59 | Admitting: Internal Medicine

## 2017-05-08 VITALS — BP 142/80 | HR 113 | Ht 68.0 in | Wt 209.0 lb

## 2017-05-08 DIAGNOSIS — J45991 Cough variant asthma: Secondary | ICD-10-CM

## 2017-05-08 DIAGNOSIS — D860 Sarcoidosis of lung: Secondary | ICD-10-CM

## 2017-05-08 LAB — PULMONARY FUNCTION TEST
DL/VA % pred: 73 %
DL/VA: 3.86 ml/min/mmHg/L
DLCO cor % pred: 50 %
DLCO cor: 14.99 ml/min/mmHg
DLCO unc % pred: 52 %
DLCO unc: 15.55 ml/min/mmHg
FEF 25-75 Post: 0.65 L/sec
FEF 25-75 Pre: 0.51 L/sec
FEF2575-%Change-Post: 27 %
FEF2575-%Pred-Post: 26 %
FEF2575-%Pred-Pre: 20 %
FEV1-%Change-Post: 9 %
FEV1-%Pred-Post: 44 %
FEV1-%Pred-Pre: 40 %
FEV1-Post: 1.29 L
FEV1-Pre: 1.17 L
FEV1FVC-%Change-Post: 3 %
FEV1FVC-%Pred-Pre: 70 %
FEV6-%Change-Post: 8 %
FEV6-%Pred-Post: 61 %
FEV6-%Pred-Pre: 56 %
FEV6-Post: 2.22 L
FEV6-Pre: 2.04 L
FEV6FVC-%Change-Post: 0 %
FEV6FVC-%Pred-Post: 100 %
FEV6FVC-%Pred-Pre: 99 %
FVC-%Change-Post: 6 %
FVC-%Pred-Post: 60 %
FVC-%Pred-Pre: 56 %
FVC-Post: 2.28 L
FVC-Pre: 2.14 L
Post FEV1/FVC ratio: 57 %
Post FEV6/FVC ratio: 98 %
Pre FEV1/FVC ratio: 55 %
Pre FEV6/FVC Ratio: 97 %
RV % pred: 107 %
RV: 2.39 L
TLC % pred: 81 %
TLC: 4.63 L

## 2017-05-08 MED ORDER — TIOTROPIUM BROMIDE MONOHYDRATE 2.5 MCG/ACT IN AERS: 2.0000 | INHALATION_SPRAY | Freq: Every day | RESPIRATORY_TRACT | 0 refills | Status: DC

## 2017-05-08 MED ORDER — PREDNISONE 5 MG PO TABS: 5.0000 mg | ORAL_TABLET | Freq: Every day | ORAL | 2 refills | Status: DC

## 2017-05-08 MED ORDER — TIOTROPIUM BROMIDE MONOHYDRATE 2.5 MCG/ACT IN AERS: 2.0000 | INHALATION_SPRAY | Freq: Every day | RESPIRATORY_TRACT | 11 refills | Status: DC

## 2017-05-08 MED ORDER — BUDESONIDE-FORMOTEROL FUMARATE 80-4.5 MCG/ACT IN AERO: INHALATION_SPRAY | RESPIRATORY_TRACT | 11 refills | Status: DC

## 2017-05-08 NOTE — Assessment & Plan Note (Signed)
Diagnosed 2009 [facility] with EBUS FNA of a hilar lymph node as well as an endobronchial lesion showing noncaseating granulomas, special stains were negative for fungal or AFB Prednisone off and on 2003-2014 11/2012 MTX/HCQ started [facility] Kidney involvement 03/12/2013 6 MW > 1500 feet, O2 saturation nadir 88% RA, HR max 132 03/2013 PFT > Ratio 66%, FEV 2.2L (82% pred), TLC 4.62 L (81% pred), DLCO 16.7 68% 09/2013 PFT > ratio 63%, FEV1 1.99> 2.08L with bronchodilator (76% predicted, 5%), TLC 4.25L (72% pred), DLCO 15.5 (64% pred) 09/2013 TPMT activity 19 > normal 10/2013 Stop MTX, start Imuran> goal dose is 184m/day (renal adjustment); plan start 527mdaily, increase 2540mvery 3 weeks; weekly CBC/CMET until 125m45my, then q-12 weeks;  11/2013 - 12/2013 > held Imuran due to LFT abnormalities 02/24/2014 CXR> improved bilateral airspace disease, upper lobe scarring noted 02/24/2014 PFT> Ratio FEV1 1.99L (75% pred, 12% change with BD), TLC 4.04 L (71% pred), DLCO 16.9 (71% pred) 06/2014 Echo> LVEF 50-55%, normal global LVF funtion, mild MR, estimated PA systolic 33.828.3ld TR, RV size normal 06/2014 PFT> ratio 55%, FEV1 1.39L (51% pred), FVC 2.52L (69%pred), TLCO 4.41L (75% pred), DLCO 64% pred 10/2014 PFT> Ratio 64% FEV1 1.52L (57% pred), FVC 2.39L (67% pred), TLC 3.58 (63% pred), DLCO 12.7 (53% pred) 10/2014 6MW> [id number] 83% RA 02/22/2014 HRCT [place]> significant improvement in GGO compared to before, chronic changes persist consistent with sarcoid, 4mm n74mle, increased PA diameter - 02/20/2017 Angiotensin level  54 >>   try off mtx/ wean prednisone with max of 20 and min of 5 mg daily   - .PFT's  05/08/2017  FEV1 1.29 (44 % ) ratio 57  p 9 % improvement from saba p symb 80 prior to study with DLCO  52/50 % corrects to 73  % for alv volume - 05/08/2017  After extensive coaching device effectiveness =    90% with SMI > try spiriva respimat 2.5 x 2 each anm and if not improving then restart mtx in [place] In meantime The goal with a chronic steroid dependent illness is always arriving at the lowest effective dose that controls the disease/symptoms and not accepting a set "formula" which is based on statistics or guidelines that don't always take into account patient  variability or the natural hx of the dz in every individual patient, which may well vary over time.  For now therefore I recommend the patient maintain  20 mg ceiling and 5 mg floor   I had an extended discussion with the patient reviewing all relevant studies completed to date and  lasting 15 to 20 minutes of a 25 minute visit    Each maintenance medication was reviewed in detail including most importantly the difference between maintenance and prns and under what circumstances the prns are to be triggered using an action plan format that is not reflected in the computer generated alphabetically organized AVS.    Please see AVS for specific instructions unique to this visit that I personally wrote and verbalized to the the pt in detail and then reviewed with pt  by my nurse highlighting any  changes in therapy recommended at today's visit to their plan of care.

## 2017-05-08 NOTE — Progress Notes (Signed)
Subjective:     Patient ID: Stacie [name], female   DOB: 1956/01/26,  .   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 She was diagnosed with sarcoid in 2009 at the [place] [facility] via an EBUS guided fine needle aspiration of a hilar lymph node as well as an endobronchial biopsy which showed noncaseating granulomas with negative special stains for infectious organisms. She was treated off and on with steroids from 2009 2 2014 and then in 2014 was started on methotrexate in addition to Plaquenil. She also has chronic kidney disease which is believed to be directly related to sarcoidosis. She had no improvement with methotrexate and plaquenil and her TLC and DLCO declined slightly so in January 2015 she was started on imuran but this had to be held because of increasing LFTs. She ended up seeing Dr. Farrel Gobble at [facility] in 01/2014 for her sarcoid. He felt like her symptoms were more due to Asthma than Sarcoid. Plaquenil was restarted, prednisone tapered, and Advair changed to QVar. In 2016 she was admitted for community-acquired pneumonia and had in general worsening respiratory symptoms. She improved, but lung function testing had shown a fairly progressive decline in DLCO. Despite treatment for the pneumonia, she noted severe worsening dyspnea over several months. CellCept was started in June 2016 but not maintained     History of Present Illness  02/20/2017 acute extended ov/[place] Nalepa re:  60 yowf never smoker with sarcoid with airway involvement maint on mtx/pred/breo 200 on 10 mg daily  Chief Complaint  Patient presents with  . Acute Visit    Pt stop prednisone on Friday per dr orders. Pt has SOB with activity, fatigue that was occuring with the prednisone, but is now more pronounce being off the pred.  Pt has feet  swelling that occured with trip overse[place] 3 weeks ago.   each time she tapers prednisone below 10 per day x years develops  dry cough esp triggered by  exercise / speaking / not sleeping on cpap / sob with activity > slow adls rec Stop Breo and methotrexate and zithromax  Symbicort 80 Take 2 puffs first thing in am and then another 2 puffs about 12 hours later.  Work on inhaler technique:  Change prilosec to 40 mg Take 30- 60 min before your first  Meal and zantac 150 mg after supper or at bedtime  GERD diet  Prednisone 10 mg per day until better then try 10 mg on even and 5 mg on odd until return  Please remember to go to the lab and x-ray department downstairs in the basement  for your tests - we will call you with the results when they are available. Please schedule a follow up office visit in 4 weeks, sooner if needed  with all medications /inhalers/ solutions in hand so we can verify exactly what you are taking. This includes all medications from all doctors and over the counters    03/20/2017  f/u ov/Stacie [name] re:  Sarcoid vs cough variant asthma / vcd on symb 80 2bid but poor hfa/ singulair and "allergy drops" Chief Complaint  Patient presents with  . Follow-up    Coughing less but her breathing is unchanged. No new co's today.   does drops sl per ENT in [place] x 6 m Can't tell it's helping. Still  on 30 mg per day prednisone as misunderstood my instructions Since early spring 2018 was able to come off pred x one month only then back on Oct - feb 2018 did not do  much if any prednisone  Sleeps fine  Doe = MMRC1 = can walk nl pace, flat grade, can't hurry or go uphills or steps s sob  To control cough  rec Prednisone ceiling should be 20 mg and floor = 5 mg until you return  To get the most out of exercise, you need to be continuously aware that you are short of breath, but never out of breath, for 30 minutes daily.    Work on inhaler technique:   Please schedule a follow up office  visit in 6 weeks, call sooner if needed with pfts on return  - add: next ov Ace level/ trial of gabapentin for cough as much of her problem may actually be fixed airflow obstruction with variable symptoms due to upper airway cough syndrome and not due to asthma related to sarcoid or otherwise.     05/08/2017  f/u ov/Stacie [name] re:  Sarcoid/ pred 5  And symb 80 2bid / max gerd rx  Chief Complaint  Patient presents with  . Follow-up    PFT's done today. Breathing worse since the last visit. She is coughing less and not really producing any sputum.  She feels very fatigued and states she feels SOB all of the time, esp with exertion. She notices wheezing at night.   symb 160 caused more cough w/in a week but then even before tapered pred down to 5 mg per day felt breathing worse assoc with some noct wheeze but never uses saba x if overdoes  It out in the heat   No obvious day to day or daytime variability or assoc excess/ purulent sputum or mucus plugs or hemoptysis or cp or chest tightness,  or overt sinus or hb symptoms. No unusual exp hx or h/o childhood pna/ asthma or knowledge of premature birth.  Sleeping ok without nocturnal  or early am exacerbation  of respiratory  c/o's or need for noct saba. Also denies any obvious fluctuation of symptoms with weather or environmental changes or other aggravating or alleviating factors except as outlined above   Current Medications, Allergies, Complete Past Medical History, Past Surgical History, Family History, and Social History were reviewed in [employer] record.  ROS  The following are not active complaints unless bolded sore throat, dysphagia, dental problems, itching, sneezing,  nasal congestion or excess/ purulent secretions, ear ache,   fever, chills, sweats, unintended wt loss, classically pleuritic or exertional cp,  orthopnea pnd or leg swelling, presyncope, palpitations, abdominal pain, anorexia, nausea, vomiting, diarrhea  or  change in bowel or bladder habits, change in stools or urine, dysuria,hematuria,  rash, arthralgias, visual complaints, headache, numbness, weakness or ataxia or problems with walking or coordination,  change in mood/affect or memory.            Objective:   Physical Exam  pleasant amb wf nad    05/08/2017       209  03/20/2017         206   02/20/17 205 lb 12.8 oz (93.4 kg)  08/30/16 202 lb (91.6 kg)  05/26/16 184 lb 9.6 oz (83.7 kg)    Vital signs reviewed - Note on arrival 02 sats  95% on RA     HEENT: nl dentition, turbinates bilaterally, and oropharynx. Nl external ear  canals without cough reflex   NECK :  without JVD/Nodes/TM/ nl carotid upstrokes bilaterally   LUNGS: no acc muscle use,  Nl contour chest with diffuse  insp pops/squeaks and min exp rhonchi bilaterally    CV:  RRR  no s3 or murmur or increase in P2, and  No sign lower ext pitting  edema   ABD:  Obese/ soft and nontender with nl inspiratory excursion in the supine position. No bruits or organomegaly appreciated, bowel sounds nl  MS:  Nl gait/ ext warm without deformities, calf tenderness, cyanosis or clubbing No obvious joint restrictions   SKIN: warm and dry without lesions    NEURO:  alert, approp, nl sensorium with  no motor or cerebellar deficits apparent.       I personally reviewed images and agree with radiology impression as follows:  CXR:   02/20/17 Diffuse bilateral fibrotic and interstitial changes, reflecting the patient's sarcoidosis. This appears mildly worsened from September, without definite evidence for superimposed pneumonia.    Assessment:

## 2017-05-08 NOTE — Assessment & Plan Note (Signed)
Body mass index is 31.78 kg/m.  -  trending up on pred Lab Results  Component Value Date   TSH 0.362 (L) 04/20/2017     Contributing to gerd risk/ doe/reviewed the need and the process to achieve and maintain neg calorie balance > defer f/u primary care including intermittently monitoring thyroid status

## 2017-05-08 NOTE — Progress Notes (Signed)
PFT done today.

## 2017-05-08 NOTE — Assessment & Plan Note (Signed)
Spirometry 02/20/2017  FEV1 1.25 (44%)  Ratio 59 p Breo 200 - FENO 02/20/2017  =   12 p Breo 200  - 02/20/2017  After extensive coaching HFA effectiveness = 75% from baseline 25%    So try symb 80 and stop breo 200  - 03/20/2017  After extensive coaching HFA effectiveness =    75% from a baseline of 50%  - Spirometry 03/20/2017  FEV1 1.38 (48%)  Ratio 56 p am symb but only 50%  Effective technique at baseline  - Changed to symb 160 2bid 04/04/2017 > cough worse so back to 80 2bid w/in a few weeks  - Spirometry 05/08/2017  FEV1 1.29 (44%)  Ratio 57 p am symbicort 80 x 2 pffs and pred 5 mg daily   - added spriva 2.5 trial 05/08/2017    She reports worse even before tapering prednisone so may have significant steroid resistant airways dz > try spiriva  2.5 x 2 each am and sarcoid a/p

## 2017-05-08 NOTE — Patient Instructions (Signed)
Plan A = Automatic = symbicort 80 2 pffs each am and chase with spiriva 2.5 x 2 and then the symbiocrt 80 x 2 x 12 hours later    Plan B = Backup Only use your albuterol as a rescue medication to be used if you can't catch your breath by resting or doing a relaxed purse lip breathing pattern.  - The less you use it, the better it will work when you need it. - Ok to use the inhaler up to 2 puffs  every 4 hours if you must but call for appointment if use goes up over your usual need - Don't leave home without it !!  (think of it like the spare tire for your car)     If breathing not improving consider restarting methotrexate (possibly through a rheumatologist or [place])   For now ceiling for prednisone is 20 mg and floor is 5 mg daily    Pulmonary follow up at this office is as needed

## 2017-05-29 ENCOUNTER — Telehealth: Payer: Self-pay | Admitting: Internal Medicine

## 2017-05-29 MED ORDER — TIOTROPIUM BROMIDE MONOHYDRATE 2.5 MCG/ACT IN AERS: 2.0000 | INHALATION_SPRAY | Freq: Every day | RESPIRATORY_TRACT | 11 refills | Status: DC

## 2017-05-29 NOTE — Telephone Encounter (Signed)
Called and spoke to pt. Pt is requesting a rx for spiriva respimat 2.5. Advised pt that the rx was printed during last OV, pt states she has lost the rx and is requesting this be sent to pharmacy. Rx sent to preferred pharmacy. Pt verbalized understanding and denied any further questions or concerns at this time.

## 2017-06-20 ENCOUNTER — Telehealth: Payer: Self-pay | Admitting: Internal Medicine

## 2017-06-20 NOTE — Telephone Encounter (Signed)
Spoke with Coren at [employer], advised that pt has never started [place] through our office.  Nothing further needed.

## 2017-07-01 ENCOUNTER — Other Ambulatory Visit: Payer: Self-pay | Admitting: Internal Medicine

## 2017-07-10 ENCOUNTER — Ambulatory Visit
Admission: RE | Admit: 2017-07-10 | Discharge: 2017-07-10 | Disposition: A | Discharge status: Home / Self Care | Payer: 59 | Source: Ambulatory Visit | Attending: Internal Medicine | Admitting: Internal Medicine

## 2017-07-10 DIAGNOSIS — Z1231 Encounter for screening mammogram for malignant neoplasm of breast: Secondary | ICD-10-CM | POA: Insufficient documentation

## 2017-07-10 DIAGNOSIS — Z1239 Encounter for other screening for malignant neoplasm of breast: Secondary | ICD-10-CM

## 2017-07-17 ENCOUNTER — Telehealth: Payer: Self-pay | Admitting: Internal Medicine

## 2017-07-17 MED ORDER — PREDNISONE 2.5 MG PO TABS: 2.5000 mg | ORAL_TABLET | Freq: Every day | ORAL | 2 refills | Status: DC

## 2017-07-17 MED ORDER — PREDNISONE 5 MG PO TABS: 5.0000 mg | ORAL_TABLET | Freq: Every day | ORAL | 2 refills | Status: DC

## 2017-07-17 NOTE — Telephone Encounter (Signed)
Spoke with pt. She is aware of MW's recommendations. OV has been scheduled for 08/28/17 at [place] have been sent in. Nothing further was needed.

## 2017-07-17 NOTE — Telephone Encounter (Signed)
She has found a reasonable "floor" dose of 7.5 mg daily and would leave it there x 6 weeks or whenever next ov whichever comes first so fine to change the rx to make it easier - I believe it comes in 2.17m also but if not then 1's ok

## 2017-07-17 NOTE — Telephone Encounter (Signed)
Pt had been titrating her prednisone down since last OV, states she got down to 31m qd and started having respiratory symptoms again.  Pt then started taking 7.569mdaily which is maintaining her s/s.  Pt is requesting a rx for 33m12mND 1mg52mbs to take 7mg 2mly so she doesn't have to break tabs in half.  Pt also requesting recs on stepdown taper back to 33mg q40m   [facility] [address] [place] if ok to send rx's, and please advise on guidance of pred taper.  Thanks!   05/08/17 AVS:  Patient Instructions   Plan A = Automatic = symbicort 80 2 pffs each am and chase with spiriva 2.5 x 2 and then the symbiocrt 80 x 2 x 12 hours later      Plan B = Backup Only use your albuterol as a rescue medication to be used if you can't catch your breath by resting or doing a relaxed purse lip breathing pattern.  - The less you use it, the better it will work when you need it. - Ok to use the inhaler up to 2 puffs  every 4 hours if you must but call for appointment if use goes up over your usual need - Don't leave home without it !!  (think of it like the spare tire for your car)      If breathing not improving consider restarting methotrexate (possibly through a rheumatologist or Ramachandron)    For now ceiling for prednisone is 20 mg and floor is 5 mg daily     Pulmonary follow up at this office is as needed

## 2017-07-21 ENCOUNTER — Telehealth: Payer: Self-pay | Admitting: Internal Medicine

## 2017-07-21 NOTE — Telephone Encounter (Signed)
Spoke with pt, I advised her that there were two prescriptions for Prednisone sent in and that she should have 2.5 and 5 mg, take one daily. Pt states the pharmacy may have thought she was picking up one Rx and didn't see the other. Pt states she will call pharmacy to see if they have the other Rx. I advised her to call us if we need to re-send RX. Pt understood and nothing further is needed.

## 2017-08-24 ENCOUNTER — Ambulatory Visit: Payer: 59 | Admitting: Internal Medicine

## 2017-08-28 ENCOUNTER — Ambulatory Visit: Payer: 59 | Admitting: Internal Medicine

## 2017-08-28 ENCOUNTER — Encounter: Payer: Self-pay | Admitting: Internal Medicine

## 2017-08-28 VITALS — BP 124/74 | HR 112 | Ht 68.0 in | Wt 208.0 lb

## 2017-08-28 DIAGNOSIS — D860 Sarcoidosis of lung: Secondary | ICD-10-CM

## 2017-08-28 DIAGNOSIS — J45991 Cough variant asthma: Secondary | ICD-10-CM

## 2017-08-28 DIAGNOSIS — Z23 Encounter for immunization: Secondary | ICD-10-CM

## 2017-08-28 MED ORDER — ACETAMINOPHEN-CODEINE #3 300-30 MG PO TABS: ORAL_TABLET | ORAL | 0 refills | Status: DC

## 2017-08-28 NOTE — Progress Notes (Signed)
Subjective:     Patient ID: Stacie [name], female   DOB: Jun 28, 1956,  .   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 She was diagnosed with sarcoid in 2009 at the [place] [facility] via an EBUS guided fine needle aspiration of a hilar lymph node as well as an endobronchial biopsy which showed noncaseating granulomas with negative special stains for infectious organisms. She was treated off and on with steroids from 2009 2 2014 and then in 2014 was started on methotrexate in addition to Plaquenil. She also has chronic kidney disease which is believed to be directly related to sarcoidosis. She had no improvement with methotrexate and plaquenil and her TLC and DLCO declined slightly so in January 2015 she was started on imuran but this had to be held because of increasing LFTs. She ended up seeing Dr. Farrel Gobble at [facility] in 01/2014 for her sarcoid. He felt like her symptoms were more due to Asthma than Sarcoid. Plaquenil was restarted, prednisone tapered, and Advair changed to QVar. In 2016 she was admitted for community-acquired pneumonia and had in general worsening respiratory symptoms. She improved, but lung function testing had shown a fairly progressive decline in DLCO. Despite treatment for the pneumonia, she noted severe worsening dyspnea over several months. CellCept was started in June 2016 but not maintained     History of Present Illness  02/20/2017 acute extended ov/Stacie [name] re:   never smoker with sarcoid with airway involvement maint on mtx/pred/breo 200 on 10 mg daily  Chief Complaint  Patient presents with  . Acute Visit    Pt stop prednisone on Friday per dr orders. Pt has SOB with activity, fatigue that was occuring with the prednisone, but is now more pronounce being off the pred.  Pt has feet swelling that  occured with trip [place] 3 [place].   each time she tapers prednisone below 10 per day x years develops  dry cough esp triggered by  exercise / speaking / not sleeping on cpap / sob with activity > slow adls rec Stop Breo and methotrexate and zithromax  Symbicort 80 Take 2 puffs first thing in am and then another 2 puffs about 12 hours later.  Work on inhaler technique:  Change prilosec to 40 mg Take 30- 60 min before your first  Meal and zantac 150 mg after supper or at bedtime  GERD diet  Prednisone 10 mg per day until better then try 10 mg on even and 5 mg on odd until return  Please remember to go to the lab and x-ray department downstairs in the basement  for your tests - we will call you with the results when they are available. Please schedule a follow up office visit in 4 weeks, sooner if needed  with all medications /inhalers/ solutions in hand so we can verify exactly what you are taking. This includes all medications from all doctors and over the counters    03/20/2017  f/u ov/Stacie [name] re:  Sarcoid vs cough variant asthma / vcd on symb 80 2bid but poor hfa/ singulair and "allergy drops" Chief Complaint  Patient presents with  . Follow-up    Coughing less but her breathing is unchanged. No new co's today.   does drops sl per ENT in [place] x 6 m Can't tell it's helping. Still on  30 mg per day prednisone as misunderstood my instructions Since early spring 2018 was able to come off pred x one month only then back on Oct - feb 2018 did not do  much if any prednisone  Sleeps fine  Doe = MMRC1 = can walk nl pace, flat grade, can't hurry or go uphills or steps s sob  To control cough  rec Prednisone ceiling should be 20 mg and floor = 5 mg until you return  To get the most out of exercise, you need to be continuously aware that you are short of breath, but never out of breath, for 30 minutes daily.    Work on inhaler technique:   Please schedule a follow up office visit in 6 weeks,  call sooner if needed with pfts on return  - add: next ov Ace level/ trial of gabapentin for cough as much of her problem may actually be fixed airflow obstruction with variable symptoms due to upper airway cough syndrome and not due to asthma related to sarcoid or otherwise.     05/08/2017  f/u ov/Stacie [name] re:  Sarcoid/ pred 5  And symb 80 2bid / max gerd rx  Chief Complaint  Patient presents with  . Follow-up    PFT's done today. Breathing worse since the last visit. She is coughing less and not really producing any sputum.  She feels very fatigued and states she feels SOB all of the time, esp with exertion. She notices wheezing at night.   symb 160 caused more cough w/in a week but then even before tapered pred down to 5 mg per day felt breathing worse assoc with some noct wheeze but never uses saba x if overdoes  It out in the heat rec Plan A = Automatic = symbicort 80 2 pffs each am and chase with spiriva 2.5 x 2 and then the symbiocrt 80 x 2 x 12 hours later  Plan B = Backup Only use your albuterol as a rescue medication  If breathing not improving consider restarting methotrexate (possibly through a rheumatologist or Ramachandron)  For now ceiling for prednisone is 20 mg and floor is 5 mg daily      08/28/2017  f/u ov/Stacie [name] re: sarcoid pred 7.5 / spiriva/ symb 80 2bid fine x months s singulair/ on gerd rx  Chief Complaint  Patient presents with  . Follow-up    Increased cough x 2-3 wks. She states developed a cold a month ago. She is coughing up cream colored sputum.     acutely worse x 2-3 weeks/ assoc with nasal discharge  Has saba/ rarely uses except  when over does it ie with ex  No problem at hs  Not limited by breathing from desired activities     No obvious day to day or daytime variability or assoc excess/ purulent sputum or mucus plugs or hemoptysis or cp or chest tightness, subjective wheeze or overt sinus or hb symptoms. No unusual exp hx or h/o childhood pna/ asthma or  knowledge of premature birth.  Sleeping ok flat without nocturnal  or early am exacerbation  of respiratory  c/o's or need for noct saba. Also denies any obvious fluctuation of symptoms with weather or environmental changes or other aggravating or alleviating factors except as outlined above   Current Allergies, Complete Past Medical History, Past Surgical History, Family History, and Social History were reviewed in [employer] record.   ROS  The following are not active complaints unless bolded Hoarseness,  sore throat, dysphagia, dental problems, itching, sneezing,  nasal congestion or discharge of excess mucus or purulent secretions, ear ache,   fever, chills, sweats, unintended wt loss or wt gain, classically pleuritic or exertional cp,  orthopnea pnd or leg swelling, presyncope, palpitations, abdominal pain, anorexia, nausea, vomiting, diarrhea  or change in bowel habits or change in bladder habits, change in stools or change in urine, dysuria, hematuria,  rash, arthralgias, visual complaints, headache, numbness, weakness or ataxia or problems with walking or coordination,  change in mood/affect or memory.        Current Meds  Medication Sig  . albuterol (VENTOLIN HFA) 108 (90 Base) MCG/ACT inhaler Inhale 2 puffs into the lungs every 6 (six) hours as needed for wheezing.  Marland Kitchen aspirin 81 MG tablet Take 81 mg by mouth daily.  . budesonide-formoterol (SYMBICORT) 80-4.5 MCG/ACT inhaler Take 2 puffs first thing in am and then another 2 puffs about 12 hours later.  Marland Kitchen GLUCOSAMINE PO Take 1 tablet by mouth daily.  . mometasone (NASONEX) 50 MCG/ACT nasal spray Place 1 spray into the nose 2 (two) times daily.  . montelukast (SINGULAIR) 10 MG tablet Take 10 mg by mouth at bedtime.  . Multiple Vitamin (MULTIVITAMIN) capsule Take 1 capsule by mouth 2 (two) times daily.   Marland Kitchen omeprazole (PRILOSEC) 40 MG capsule Take 40 mg by mouth daily.  . predniSONE (DELTASONE) 2.5 MG tablet Take 1  tablet (2.5 mg total) by mouth daily with breakfast.  . predniSONE (DELTASONE) 5 MG tablet Take 1 tablet (5 mg total) by mouth daily with breakfast.  . ranitidine (ZANTAC) 150 MG tablet Take 150 mg by mouth at bedtime. Reported on 03/30/2016  . SYNTHROID 137 MCG tablet TAKE 1 TABLET (137 MCG TOTAL) BY MOUTH DAILY BEFORE BREAKFAST.  Marland Kitchen Tiotropium Bromide Monohydrate (SPIRIVA RESPIMAT) 2.5 MCG/ACT AERS Inhale 2 puffs into the lungs daily.                     Objective:   Physical Exam  pleasant amb wf nad     08/30/2017     208  05/08/2017       209  03/20/2017         206   02/20/17 205 lb 12.8 oz (93.4 kg)  08/30/16 202 lb (91.6 kg)  05/26/16 184 lb 9.6 oz (83.7 kg)    Vital signs reviewed - Note on arrival 02 sats  96% on RA     HEENT: nl dentition, turbinates bilaterally, and oropharynx. Nl external ear canals without cough reflex   NECK :  without JVD/Nodes/TM/ nl carotid upstrokes bilaterally   LUNGS: no acc muscle use,  Nl contour chest with diffuse  insp pops/squeaks and min exp rhonchi bilaterally    CV:  RRR  no s3 or murmur or increase in P2, and  No sign lower ext pitting  edema     HEENT: nl dentition, turbinates bilaterally, and oropharynx. Nl external ear canals without cough reflex   NECK :  without JVD/Nodes/TM/ nl carotid upstrokes bilaterally   LUNGS: no acc muscle use,  Nl contour chest with a few insp squeaks/ min exp rhonchi bilaterally    CV:  RRR  no s3 or murmur or increase in P2, and no edema   ABD:  soft and nontender with nl inspiratory excursion in the supine position. No bruits or organomegaly appreciated, bowel sounds nl  MS:  Nl gait/ ext warm without deformities, calf tenderness, cyanosis or clubbing No  obvious joint restrictions   SKIN: warm and dry without lesions    NEURO:  alert, approp, nl sensorium with  no motor or cerebellar deficits apparent.          .    Assessment:

## 2017-08-28 NOTE — Patient Instructions (Signed)
Take delsym two tsp every 12 hours and supplement if needed with  Tylenol #3  up to 1/2 to 1  every 4 hours to suppress the urge to cough. Swallowing water or using ice chips/non mint and menthol containing candies (such as lifesavers or sugarless jolly ranchers) are also effective.  You should rest your voice and avoid activities that you know make you cough.  Once you have eliminated the cough for 3 straight days try reducing the tylenol #3  first,  then the delsym as tolerated.     Double the prednisone until better then work back to 7.5 mg daily until return    Please schedule a follow up office visit in 6 weeks, call sooner if needed

## 2017-08-30 ENCOUNTER — Encounter: Payer: Self-pay | Admitting: Internal Medicine

## 2017-08-30 NOTE — Assessment & Plan Note (Signed)
Of the three most common causes of  Sub-acute or recurrent or chronic cough, only one (GERD)  can actually contribute to/ trigger  the other two (asthma and post nasal drip syndrome)  and perpetuate the cylce of cough.  While not intuitively obvious, many patients with chronic low grade reflux do not cough until there is a primary insult that disturbs the protective epithelial barrier and exposes sensitive nerve endings.   This is typically viral but can be direct physical injury such as with an endotracheal tube.   The point is that once this occurs, it is difficult to eliminate the cycle  using anything but a maximally effective acid suppression regimen at least in the short run, accompanied by an appropriate diet to address non acid GERD and control / eliminate the cough itself for at least 3 days.    Try tylenol #3 to interrupt cycle of coughing and max rx for gerd/ reviewed   I had an extended discussion with the patient reviewing all relevant studies completed to date and  lasting 15 to 20 minutes of a 25 minute visit    Each maintenance medication was reviewed in detail including most importantly the difference between maintenance and prns and under what circumstances the prns are to be triggered using an action plan format that is not reflected in the computer generated alphabetically organized AVS.    Please see AVS for specific instructions unique to this visit that I personally wrote and verbalized to the the pt in detail and then reviewed with pt  by my nurse highlighting any  changes in therapy recommended at today's visit to their plan of care.

## 2017-08-30 NOTE — Assessment & Plan Note (Signed)
-  02/20/2017 Angiotensin level  54 >>   try off mtx/ wean prednisone with max of 20 and min of 5 mg daily  - .PFT's  05/08/2017  FEV1 1.29 (44 % ) ratio 57  p 9 % improvement from saba p symb 80 prior to study with DLCO  52/50 % corrects to 73  % for alv volume - 05/08/2017  After extensive coaching device effectiveness =    90% with SMI > try spiriva respimat 2.5 x 2 each anm and if not improving then restart mtx in [place]    She was much better x months on symb 80/spiriva smi and no singulair then acutely flared so no need to change rx at present as may not even be related to sarcoid - see uacs   The goal with a chronic steroid dependent illness is always arriving at the lowest effective dose that controls the disease/symptoms and not accepting a set "formula" which is based on statistics or guidelines that don't always take into account patient  variability or the natural hx of the dz in every individual patient, which may well vary over time.  For now therefore I recommend the patient maintain  15 mg until better then back to floor of 7.5 mg daily until next ov

## 2017-09-01 ENCOUNTER — Telehealth: Payer: Self-pay | Admitting: Internal Medicine

## 2017-09-01 MED ORDER — PREDNISONE 5 MG PO TABS: ORAL_TABLET | ORAL | 0 refills | Status: DC

## 2017-09-01 NOTE — Telephone Encounter (Signed)
Spoke with pt, who states she was instructed to take 55m prednisone until cough has improved and then back to 7.5 daily. Pt states per pharmacy this medication can not be refilled until 09/16/17. Spoke with pharmacy, who states insurance will cover with a new Rx for 172mdaily. Rx has been sent to preferred pharmacy for 1555mith instructions to decrease to 7.5 after cough has improved.  Nothing further needed.

## 2017-09-14 ENCOUNTER — Ambulatory Visit
Admission: RE | Admit: 2017-09-14 | Discharge: 2017-09-14 | Disposition: A | Discharge status: Home / Self Care | Payer: 59 | Source: Ambulatory Visit | Attending: Unknown Physician Specialty | Admitting: Unknown Physician Specialty

## 2017-09-14 ENCOUNTER — Other Ambulatory Visit: Payer: Self-pay | Admitting: Unknown Physician Specialty

## 2017-09-14 ENCOUNTER — Other Ambulatory Visit
Admission: RE | Admit: 2017-09-14 | Discharge: 2017-09-14 | Disposition: A | Discharge status: Home / Self Care | Payer: 59 | Source: Ambulatory Visit | Attending: Unknown Physician Specialty | Admitting: Unknown Physician Specialty

## 2017-09-14 DIAGNOSIS — R918 Other nonspecific abnormal finding of lung field: Secondary | ICD-10-CM

## 2017-09-14 DIAGNOSIS — R197 Diarrhea, unspecified: Secondary | ICD-10-CM | POA: Insufficient documentation

## 2017-09-14 DIAGNOSIS — J984 Other disorders of lung: Secondary | ICD-10-CM | POA: Insufficient documentation

## 2017-09-14 DIAGNOSIS — R05 Cough: Secondary | ICD-10-CM | POA: Insufficient documentation

## 2017-09-14 DIAGNOSIS — R062 Wheezing: Secondary | ICD-10-CM | POA: Insufficient documentation

## 2017-09-14 LAB — GASTROINTESTINAL PANEL BY PCR, STOOL (REPLACES STOOL CULTURE)

## 2017-09-14 LAB — C DIFFICILE QUICK SCREEN W PCR REFLEX
C Diff antigen: NEGATIVE
C Diff interpretation: NOT DETECTED
C Diff toxin: NEGATIVE

## 2017-10-02 ENCOUNTER — Other Ambulatory Visit: Payer: Self-pay | Admitting: Internal Medicine

## 2017-10-02 ENCOUNTER — Ambulatory Visit: Payer: 59 | Admitting: Internal Medicine

## 2017-10-02 MED ORDER — LEVOTHYROXINE SODIUM 137 MCG PO TABS: 137.0000 ug | ORAL_TABLET | Freq: Every day | ORAL | 1 refills | Status: DC

## 2017-10-11 ENCOUNTER — Telehealth: Payer: Self-pay | Admitting: Internal Medicine

## 2017-10-11 MED ORDER — PREDNISONE 5 MG PO TABS: ORAL_TABLET | ORAL | 0 refills | Status: DC

## 2017-10-11 MED ORDER — PREDNISONE 2.5 MG PO TABS: 2.5000 mg | ORAL_TABLET | Freq: Every day | ORAL | 0 refills | Status: DC

## 2017-10-11 NOTE — Telephone Encounter (Signed)
Spoke with patient. She stated that she is currently running low on her prednisone. She was originally supposed to follow up with MW last week but had to cancel her appt due to her being sick. She has rescheduled for Jan 10th.   Advised patient I would call in a one month supply to cover her until her appt. [facility] [place]   Nothing else needed at time of call.

## 2017-10-26 ENCOUNTER — Ambulatory Visit: Payer: 59 | Admitting: Internal Medicine

## 2017-10-26 ENCOUNTER — Encounter: Payer: Self-pay | Admitting: Internal Medicine

## 2017-10-26 ENCOUNTER — Ambulatory Visit (INDEPENDENT_AMBULATORY_CARE_PROVIDER_SITE_OTHER)
Admission: RE | Admit: 2017-10-26 | Discharge: 2017-10-26 | Disposition: A | Discharge status: Home / Self Care | Payer: 59 | Source: Ambulatory Visit | Attending: Internal Medicine | Admitting: Internal Medicine

## 2017-10-26 VITALS — BP 128/84 | HR 118 | Temp 98.1°F | Ht 67.5 in | Wt 201.0 lb

## 2017-10-26 DIAGNOSIS — D860 Sarcoidosis of lung: Secondary | ICD-10-CM

## 2017-10-26 DIAGNOSIS — J45991 Cough variant asthma: Secondary | ICD-10-CM | POA: Diagnosis not present

## 2017-10-26 MED ORDER — AZITHROMYCIN 250 MG PO TABS: ORAL_TABLET | ORAL | 2 refills | Status: DC

## 2017-10-26 MED ORDER — FLUTTER DEVI: 0 refills | Status: DC

## 2017-10-26 MED ORDER — ACETAMINOPHEN-CODEINE #3 300-30 MG PO TABS: ORAL_TABLET | ORAL | 0 refills | Status: DC

## 2017-10-26 NOTE — Progress Notes (Signed)
Subjective:     Patient ID: Stacie [name], female   DOB: Nov 18, 1955,  .   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 She was diagnosed with sarcoid in 2009 at the [place] [facility] via an EBUS guided fine needle aspiration of a hilar lymph node as well as an endobronchial biopsy which showed noncaseating granulomas with negative special stains for infectious organisms. She was treated off and on with steroids from 2009 2 2014 and then in 2014 was started on methotrexate in addition to Plaquenil. She also has chronic kidney disease which is believed to be directly related to sarcoidosis. She had no improvement with methotrexate and plaquenil and her TLC and DLCO declined slightly so in January 2015 she was started on imuran but this had to be held because of increasing LFTs. She ended up seeing Dr. Farrel Gobble at [facility] in 01/2014 for her sarcoid. He felt like her symptoms were more due to Asthma than Sarcoid. Plaquenil was restarted, prednisone tapered, and Advair changed to QVar. In 2016 she was admitted for community-acquired pneumonia and had in general worsening respiratory symptoms. She improved, but lung function testing had shown a fairly progressive decline in DLCO. Despite treatment for the pneumonia, she noted severe worsening dyspnea over several months. CellCept was started in June 2016 but not maintained     History of Present Illness  02/20/2017 acute extended ov/[name] re:   never smoker with sarcoid with airway involvement maint on mtx/pred/breo 200 on 10 mg daily  Chief Complaint  Patient presents with  . Acute Visit    Pt stop prednisone on Friday per dr orders. Pt has SOB with activity, fatigue that was occuring with the prednisone, but is now more pronounce being off the pred.  Pt has feet swelling  that occured with trip [place] 3 [place] ago.   each time she tapers prednisone below 10 per day x years develops  dry cough esp triggered by  exercise / speaking / not sleeping on cpap / sob with activity > slow adls rec Stop Breo and methotrexate and zithromax  Symbicort 80 Take 2 puffs first thing in am and then another 2 puffs about 12 hours later.  Work on inhaler technique:  Change prilosec to 40 mg Take 30- 60 min before your first  Meal and zantac 150 mg after supper or at bedtime  GERD diet  Prednisone 10 mg per day until better then try 10 mg on even and 5 mg on odd until return  Please remember to go to the lab and x-ray department downstairs in the basement  for your tests - we will call you with the results when they are available. Please schedule a follow up office visit in 4 weeks, sooner if needed  with all medications /inhalers/ solutions in hand so we can verify exactly what you are taking. This includes all medications from all doctors and over the counters    03/20/2017  f/u ov/[name] re:  Sarcoid vs cough variant asthma / vcd on symb 80 2bid but poor hfa/ singulair and "allergy drops" Chief Complaint  Patient presents with  . Follow-up    Coughing less but her breathing is unchanged. No new co's today.   does drops sl per ENT in [place] x 6 m Can't tell it's helping.  Still on 30 mg per day prednisone as misunderstood my instructions Since early spring 2018 was able to come off pred x one month only then back on Oct - feb 2018 did not do  much if any prednisone  Sleeps fine  Doe = MMRC1 = can walk nl pace, flat grade, can't hurry or go uphills or steps s sob  To control cough  rec Prednisone ceiling should be 20 mg and floor = 5 mg until you return  To get the most out of exercise, you need to be continuously aware that you are short of breath, but never out of breath, for 30 minutes daily.    Work on inhaler technique:   Please schedule a follow up office visit in 6  weeks, call sooner if needed with pfts on return  - add: next ov Ace level/ trial of gabapentin for cough as much of her problem may actually be fixed airflow obstruction with variable symptoms due to upper airway cough syndrome and not due to asthma related to sarcoid or otherwise.     05/08/2017  f/u ov/[name] re:  Sarcoid/ pred 5  And symb 80 2bid / max gerd rx  Chief Complaint  Patient presents with  . Follow-up    PFT's done today. Breathing worse since the last visit. She is coughing less and not really producing any sputum.  She feels very fatigued and states she feels SOB all of the time, esp with exertion. She notices wheezing at night.   symb 160 caused more cough w/in a week but then even before tapered pred down to 5 mg per day felt breathing worse assoc with some noct wheeze but never uses saba x if overdoes  It out in the heat rec Plan A = Automatic = symbicort 80 2 pffs each am and chase with spiriva 2.5 x 2 and then the symbiocrt 80 x 2 x 12 hours later  Plan B = Backup Only use your albuterol as a rescue medication  If breathing not improving consider restarting methotrexate (possibly through a rheumatologist or Ram[place]andron)  For now ceiling for prednisone is 20 mg and floor is 5 mg daily      08/28/2017  f/u ov/[name] re: sarcoid pred 7.5 / spiriva/ symb 80 2bid fine x months s singulair/ on gerd rx  Chief Complaint  Patient presents with  . Follow-up    Increased cough x 2-3 wks. She states developed a cold a month ago. She is coughing up cream colored sputum.     acutely worse x 2-3 weeks/ assoc with nasal discharge  Has saba/ rarely uses except  when over does it ie with ex  No problem at hs  rec Take delsym two tsp every 12 hours and supplement if needed with  Tylenol #3  up to 1/2 to 1  every 4 hours to suppress the urge to cough.     try reducing the tylenol #3  first,  then the delsym as tolerated.   Double the prednisone until better then work back to 7.5 mg  daily until return     10/26/2017 acute extended ov/[name] re:  Recurrent cough and sob  Chief Complaint  Patient presents with  . Acute Visit    2 grandkids recently had RSV, coughing, shortness of breath, congested    in the week before xmas "doing fine"  = no night time cough/ just coughing a few times a day with activity on tapered pred down to 7.5mg   while on symb 80 and spiriva daily   Did not follow instruction re taper tyl #3  But eventually stopped all cough med and did ok until exp to sick kids  Then acutely worse Dec 25 high fever severe cough > ENT rec neb/ tussionex  No abx since then but in retrospect has done best while on zmax daily  Cough was originally dry and 24/7 and for the last week prior to OV  Turned slt yellow and day > noct  Turns out not singulair x months s adverse effects  Mostly sob when coughing / neb not really helping    No obvious day to day or daytime variability or assoc excess/ purulent sputum or mucus plugs or hemoptysis or cp or chest tightness, subjective wheeze or overt sinus or hb symptoms. No unusual exposure hx or h/o childhood pna/ asthma or knowledge of premature birth.  Sleeping ok flat without nocturnal  or early am exacerbation  of respiratory  c/o's or need for noct saba. Also denies any obvious fluctuation of symptoms with weather or environmental changes or other aggravating or alleviating factors except as outlined above   Current Allergies, Complete Past Medical History, Past Surgical History, Family History, and Social History were reviewed in [employer] record.  ROS  The following are not active complaints unless bolded Hoarseness, sore throat, dysphagia, dental problems, itching, sneezing,  nasal congestion or discharge of excess mucus or purulent secretions, ear ache,   fever, chills, sweats, unintended wt loss or wt gain, classically pleuritic or exertional cp,  orthopnea pnd or leg swelling, presyncope,  palpitations, abdominal pain, anorexia, nausea, vomiting, diarrhea  or change in bowel habits or change in bladder habits, change in stools or change in urine, dysuria, hematuria,  rash, arthralgias, visual complaints, headache, numbness, weakness or ataxia or problems with walking or coordination,  change in mood/affect or memory.        Current Meds  Medication Sig  . acetaminophen-codeine (TYLENOL #3) 300-30 MG tablet One every 4 hours as needed for cough  . albuterol (VENTOLIN HFA) 108 (90 Base) MCG/ACT inhaler Inhale 2 puffs into the lungs every 6 (six) hours as needed for wheezing.  Marland Kitchen aspirin 81 MG tablet Take 81 mg by mouth daily.  . budesonide-formoterol (SYMBICORT) 80-4.5 MCG/ACT inhaler Take 2 puffs first thing in am and then another 2 puffs about 12 hours later.  Marland Kitchen GLUCOSAMINE PO Take 1 tablet by mouth daily.  Marland Kitchen levothyroxine (SYNTHROID) 137 MCG tablet Take 1 tablet (137 mcg total) by mouth daily before breakfast.  . mometasone (NASONEX) 50 MCG/ACT nasal spray Place 1 spray into the nose 2 (two) times daily.  . Multiple Vitamin (MULTIVITAMIN) capsule Take 1 capsule by mouth 2 (two) times daily.   Marland Kitchen omeprazole (PRILOSEC) 40 MG capsule Take 40 mg by mouth daily.  . predniSONE (DELTASONE) 5 MG tablet Take 3 tablets until cough has improved and then go back to 7.5 daily. (Patient taking differently: 30 mg. )  . ranitidine (ZANTAC) 150 MG tablet Take 150 mg by mouth at bedtime. Reported on 03/30/2016  . Tiotropium Bromide Monohydrate (SPIRIVA RESPIMAT) 2.5 MCG/ACT AERS Inhale 2 puffs into the lungs daily.  . [  acetaminophen-codeine (TYLENOL #3) 300-30 MG tablet One every 4 hours as needed for cough  . [DISCONTINUED] montelukast (SINGULAIR) 10 MG tablet Take 10 mg by mouth at bedtime.             Objective:   Physical Exam  Uncomfortable  amb wf with very harsh barking quality non-productive cough   10/26/2017       201  08/30/2017     208  05/08/2017       209  03/20/2017          206   02/20/17 205 lb 12.8 oz (93.4 kg)  08/30/16 202 lb (91.6 kg)  05/26/16 184 lb 9.6 oz (83.7 kg)    Vital signs reviewed - Note on arrival 02 sats  92% on RA       HEENT: nl dentition,  and oropharynx. Nl external ear canals without cough reflex - moderate bilateral non-specific turbinate edema  s purulent secretions   NECK :  without JVD/Nodes/TM/ nl carotid upstrokes bilaterally   LUNGS: no acc muscle use,  Nl contour chest with minimal bilateral  insp pops/squeaks and  Cough early on insp not on exp  CV:  RRR  no s3 or murmur or increase in P2, and no edema   ABD:  soft and nontender with nl inspiratory excursion in the supine position. No bruits or organomegaly appreciated, bowel sounds nl  MS:  Nl gait/ ext warm without deformities, calf tenderness, cyanosis or clubbing No obvious joint restrictions   SKIN: warm and dry without lesions    NEURO:  alert, approp, nl sensorium with  no motor or cerebellar deficits apparent.        CXR PA and Lateral:   10/26/2017 :    I personally reviewed images and agree with radiology impression as follows:   Extensive fibrosis and cicatrization consistent with known prior sarcoidosis. Probable hilar adenopathy on the left, persisting and due to sarcoidosis. No new adenopathy evident by radiography. No frank edema or consolidation. Heart size normal. There is aortic atherosclerosis.      .    Assessment:

## 2017-10-26 NOTE — Patient Instructions (Addendum)
zithromax 250 mg take two daily and one daily thereafter until return   Taper prednisone back down to 7.80m daily   For cough >  mucinex dm 1200 mg every 12 hours and flutter valve as much as possible whenever you are coughing   If still coughing > add on  tylenol #3 up to every 4 hours until not coughing x 3 days then stop    See Tammy NP in 4  weeks with all your medications, even over the counter meds, separated in two separate bags, the ones you take no matter what vs the ones you stop once you feel better and take only as needed when you feel you need them.   Tammy  will generate for you a new user friendly medication calendar that will put [place] on the same page re: your medication use.     Without this process, it simply isn't possible to assure that we are providing  your outpatient care  with  the attention to detail we feel you deserve.   If we cannot assure that you're getting that kind of care,  then we cannot manage your problem effectively from this clinic.  Once you have seen Tammy and we are sure that we're all on the same page with your medication use she will arrange follow up with me.    Please remember to go to the  x-ray department downstairs in the basement  for your tests - we will call you with the results when they are available. Add: If symptoms not better needs HRCT/sinus ct

## 2017-10-27 ENCOUNTER — Encounter: Payer: Self-pay | Admitting: Internal Medicine

## 2017-10-27 NOTE — Assessment & Plan Note (Addendum)
Diagnosed 2009 [facility] with EBUS FNA of a hilar lymph node as well as an endobronchial lesion showing noncaseating granulomas, special stains were negative for fungal or AFB Prednisone off and on 2003-2014 11/2012 MTX/HCQ started [facility] Kidney involvement 03/12/2013 6 MW > 1500 feet, O2 saturation nadir 88% RA, HR max 132 03/2013 PFT > Ratio 66%, FEV 2.2L (82% pred), TLC 4.62 L (81% pred), DLCO 16.7 68% 09/2013 PFT > ratio 63%, FEV1 1.99> 2.08L with bronchodilator (76% predicted, 5%), TLC 4.25L (72% pred), DLCO 15.5 (64% pred) 09/2013 TPMT activity 19 > normal 10/2013 Stop MTX, start Imuran> goal dose is 158m/day (renal adjustment); plan start 575mdaily, increase 25101mvery 3 weeks; weekly CBC/CMET until 125m65my, then q-12 weeks;  11/2013 - 12/2013 > held Imuran due to LFT abnormalities 02/24/2014 CXR> improved bilateral airspace disease, upper lobe scarring noted 02/24/2014 PFT> Ratio FEV1 1.99L (75% pred, 12% change with BD), TLC 4.04 L (71% pred), DLCO 16.9 (71% pred) 06/2014 Echo> LVEF 50-55%, normal global LVF funtion, mild MR, estimated PA systolic 33.816.1ld TR, RV size normal 06/2014 PFT> ratio 55%, FEV1 1.39L (51% pred), FVC 2.52L (69%pred), TLCO 4.41L (75% pred), DLCO 64% pred 10/2014 PFT> Ratio 64% FEV1 1.52L (57% pred), FVC 2.39L (67% pred), TLC 3.58 (63% pred), DLCO 12.7 (53% pred) 10/2014 6MW> [id number] 83% RA 02/22/2014 HRCT [facility]> significant improvement in GGO compared to before, chronic changes persist consistent with sarcoid, 4mm n12mle, increased PA diameter - 02/20/2017 Angiotensin level  54 >>   try off mtx/ wean prednisone with max of 20 and min of 5 mg daily  - .PFT's  05/08/2017  FEV1 1.29 (44 % ) ratio 57  p 9 % improvement from saba p symb 80 prior to study with DLCO  52/50 % corrects to 73  % for alv volume - 05/08/2017  After extensive coaching device effectiveness =    90% with SMI > try spiriva respimat 2.5 x 2 each anm and if not improving then restart mtx in [place]      No  evidence of active dz on maint rx with pred 7.5mg da69m but may need HRCT to sort out  In meantime rec:  The goal with a chronic steroid dependent illness is always arriving at the lowest effective dose that controls the disease/symptoms and not accepting a set "formula" which is based on statistics or guidelines that don't always take into account patient  variability or the natural hx of the dz in every individual patient, which may well vary over time.  For now therefore I recommend the patient maintain  15 mg per day ceiling and 7.5 mg floor  If symptoms not better needs HRCT/sinus ct on return but first do accurate and meaningful med rec:  To keep things simple, I have asked the patient to first separate medicines that are perceived as maintenance, that is to be taken daily "no matter what", from those medicines that are taken on only on an as-needed basis and I have given the patient examples of both, and then return to see our NP to generate a  detailed  medication calendar which should be followed until the next physician sees the patient and updates it.

## 2017-10-27 NOTE — Assessment & Plan Note (Addendum)
Spirometry 02/20/2017  FEV1 1.25 (44%)  Ratio 59 p Breo 200 - FENO 02/20/2017  =   12 p Breo 200  - 02/20/2017  After extensive coaching HFA effectiveness = 75% from baseline 25%    So try symb 80 and stop breo 200  - 03/20/2017  After extensive coaching HFA effectiveness =    75% from a baseline of 50%  - Spirometry 03/20/2017  FEV1 1.38 (48%)  Ratio 56 p am symb but only 50%  Effective technique at baseline  - Changed to symb 160 2bid 04/04/2017 > cough worse so back to 80 2bid w/in a few weeks  - Spirometry 05/08/2017  FEV1 1.29 (44%)  Ratio 57 p am symbicort 80 x 2 pffs and pred 5 mg daily   - added spriva 2.5 trial 05/08/2017   - trial of daily zmax 10/26/2017  And flutter valve with training   Recurrent acute flares while on pred ? Etiology ? All viral vs underlying bronchiectasis > reasonable to try zmax maint x one month and in the meantime work on cyclical coughing with short term tyl #3   Of the three most common causes of  Sub-acute or recurrent or chronic cough, only one (GERD)  can actually contribute to/ trigger  the other two (asthma and post nasal drip syndrome)  and perpetuate the cylce of cough.  While not intuitively obvious, many patients with chronic low grade reflux do not cough until there is a primary insult that disturbs the protective epithelial barrier and exposes sensitive nerve endings.   This is typically viral as was the presumed problem here - but can be direct physical injury such as with an endotracheal tube.   The point is that once this occurs, it is difficult to eliminate the cycle  using anything but a maximally effective acid suppression regimen at least in the short run, accompanied by an appropriate diet to address non acid GERD and control / eliminate the cough itself for at least 3 days with tyl #3  / mucinex dm and flutter valve   I had an extended discussion with the patient reviewing all relevant studies completed to date and  lasting 25 minutes of a 40  minute acute  office  Visit addressing severe non-specific but potentially very serious refractory respiratory symptoms of uncertain and potentially multiple  etiologies.   Each maintenance medication was reviewed in detail including most importantly the difference between maintenance and prns and under what circumstances the prns are to be triggered using an action plan format that is not reflected in the computer generated alphabetically organized AVS.    Please see AVS for specific instructions unique to this office visit that I personally wrote and verbalized to the the pt in detail and then reviewed with pt  by my nurse highlighting any changes in therapy/plan of care  recommended at today's visit.      Marland Kitchen

## 2017-11-07 ENCOUNTER — Other Ambulatory Visit: Payer: Self-pay | Admitting: Internal Medicine

## 2017-11-12 ENCOUNTER — Other Ambulatory Visit: Payer: Self-pay | Admitting: Internal Medicine

## 2017-11-21 ENCOUNTER — Telehealth: Payer: Self-pay | Admitting: Internal Medicine

## 2017-11-21 NOTE — Telephone Encounter (Signed)
Spoke with Stacie [name] from pharmacy, advised patient is on this as a maintenance medication and should take this once daily.

## 2017-11-27 ENCOUNTER — Ambulatory Visit: Payer: 59 | Admitting: Adult Health

## 2017-11-27 ENCOUNTER — Encounter: Payer: Self-pay | Admitting: Adult Health

## 2017-11-27 DIAGNOSIS — D860 Sarcoidosis of lung: Secondary | ICD-10-CM

## 2017-11-27 DIAGNOSIS — J4540 Moderate persistent asthma, uncomplicated: Secondary | ICD-10-CM | POA: Diagnosis not present

## 2017-11-27 DIAGNOSIS — J45991 Cough variant asthma: Secondary | ICD-10-CM

## 2017-11-27 NOTE — Assessment & Plan Note (Signed)
Recent exacerbation with associated bronchiectasis exacerbation.  On chronic steroids.  Patient has slowly taper down to her baseline of 7.5 mg.  She has been on long-term steroids.  Patient education given.  Patient is doing well on next visit.  Can consider decreasing further at a very slow pace.  Hopefully  getting down to 5 mg daily.

## 2017-11-27 NOTE — Progress Notes (Signed)
_0  ID: Dawna Part, female    DOB: Apr 10, 1956, 62 y.o.   MRN: [id number]  Chief Complaint  Patient presents with  . Follow-up    Asthma     Referring provider: Glean Hess, MD  HPI: 62 year old female never smoker followed for asthma and sarcoidosis (diagnosed in 2009 at [facility] via  EBUS of a hilar lymph node as well as a endobronchial biopsy which showed noncaseating granulomatous with negative special stains for infectious organisms). She has been treated on and off with steroids since 2019.  She has been on methotrexate, Imuran and Plaquenil in the past.   Past medical history significant for Chronic kidney disease  TEST  Spirometry 02/20/2017  FEV1 1.25 (44%)  Ratio 59 p Breo 200 - FENO 02/20/2017  =   12 p Breo 200   Diagnosed 2009 [facility] with EBUS FNA of a hilar lymph node as well as an endobronchial lesion showing noncaseating granulomas, special stains were negative for fungal or AFB Prednisone off and on 2003-2014 11/2012 MTX/HCQ started [facility] Kidney involvement 03/12/2013 6 MW > 1500 feet, O2 saturation nadir 88% RA, HR max 132 03/2013 PFT > Ratio 66%, FEV 2.2L (82% pred), TLC 4.62 L (81% pred), DLCO 16.7 68% 09/2013 PFT > ratio 63%, FEV1 1.99> 2.08L with bronchodilator (76% predicted, 5%), TLC 4.25L (72% pred), DLCO 15.5 (64% pred) 09/2013 TPMT activity 19 > normal 10/2013 Stop MTX, start Imuran> goal dose is 14m/day (renal adjustment); plan start 553mdaily, increase 2554mvery 3 weeks; weekly CBC/CMET until 125m2my, then q-12 weeks;  11/2013 - 12/2013 > held Imuran due to LFT abnormalities 02/24/2014 CXR> improved bilateral airspace disease, upper lobe scarring noted 02/24/2014 PFT> Ratio FEV1 1.99L (75% pred, 12% change with BD), TLC 4.04 L (71% pred), DLCO 16.9 (71% pred) 06/2014 Echo> LVEF 50-55%, normal global LVF funtion, mild MR, estimated PA systolic 33.828.3ld TR, RV size normal 06/2014 PFT> ratio 55%, FEV1 1.39L (51% pred), FVC 2.52L (69%pred),  TLCO 4.41L (75% pred), DLCO 64% pred 10/2014 PFT> Ratio 64% FEV1 1.52L (57% pred), FVC 2.39L (67% pred), TLC 3.58 (63% pred), DLCO 12.7 (53% pred) 10/2014 6MW> [id number] 83% RA 02/22/2014 HRCT [place]> significant improvement in GGO compared to before, chronic changes persist consistent with sarcoid, 4mm n37mle, increased PA diameter - 02/20/2017 Angiotensin level  54 >>   try off mtx/ wean prednisone with max of 20 and min of 5 mg daily  - .PFT's  05/08/2017  FEV1 1.29 (44 % ) ratio 57  p 9 % improvement from saba p symb 80 prior to study with DLCO  52/50 % corrects to 73  % for alv volume - 05/08/2017  After extensive coaching device effectiveness =    90% with SMI > try spiriva respimat 2.5 x 2 each anm and if not improving then [facility] Follow up : Sarcoid and Asthma  Patient presents for a one-month follow-up.  Patient was seen last visit with an acute upper respiratory infection.  She had recently been exposed to her grandchildren who had RSV.  She had increased cough and shortness of breath.  Patient was started on azithromycin daily basis.  Patient has recurrent asthma and Bronchiectasis /Sarcoid exacerbations.  She is on chronic steroids.  She was given a prednisone taper. -taper down to 7.5 mg daily. Since last visit patient says she is feeling improved.  Cough and shortness of breath have decreased. Patient denies  any hemoptysis orthopnea PND or increased leg swelling. Appetite is good with no nausea vomiting or diarrhea. Patient education on chronic steroids and antibiotics. Patient says she is taking daily probiotic and eating yogurt.. We reviewed her medications and updated her MAR with patient education.  She appears to be taking her medications correctly.  Allergies  Allergen Reactions  . Nitrofurantoin Nausea Only  . Tramadol     Nausea and vomiting  . Sulfa Antibiotics Rash    As an infant    Immunization History  Administered Date(s)  Administered  . Influenza Split 09/01/2013, 08/03/2016  . Influenza Whole 07/17/2012, 07/17/2017  . Influenza,inj,Quad PF,6+ Mos 07/30/2014, 11/10/2015  . Pneumococcal Polysaccharide-23 10/17/2010, 08/28/2017    Past Medical History:  Diagnosis Date  . Arrhythmia    patient unaware if this is current  . Asthma   . HOH (hard of hearing)    wear aids  . Hypothyroidism   . IBS (irritable bowel syndrome)   . Sarcoid   . Sarcoidosis   . Seasonal allergies   . Sleep apnea CPAP with O2    Tobacco History: Social History   Tobacco Use  Smoking Status Never Smoker  Smokeless Tobacco Never Used   Counseling given: Not Answered   Outpatient Encounter Medications as of 11/27/2017  Medication Sig  . acetaminophen-codeine (TYLENOL #3) 300-30 MG tablet One every 4 hours as needed for cough  . albuterol (VENTOLIN HFA) 108 (90 Base) MCG/ACT inhaler Inhale 2 puffs into the lungs every 6 (six) hours as needed for wheezing.  Marland Kitchen aspirin 81 MG tablet Take 81 mg by mouth daily.  Marland Kitchen azithromycin (ZITHROMAX) 250 MG tablet Take 2 on day one then 1 daily  . budesonide-formoterol (SYMBICORT) 80-4.5 MCG/ACT inhaler Take 2 puffs first thing in am and then another 2 puffs about 12 hours later.  . cetirizine (ZYRTEC) 10 MG tablet Take 10 mg by mouth daily as needed.   Marland Kitchen GLUCOSAMINE PO Take 1 tablet by mouth daily.  Marland Kitchen levothyroxine (SYNTHROID) 137 MCG tablet Take 1 tablet (137 mcg total) by mouth daily before breakfast.  . mometasone (NASONEX) 50 MCG/ACT nasal spray Place 1 spray into the nose 2 (two) times daily.  . Multiple Vitamin (MULTIVITAMIN) capsule Take 1 capsule by mouth 2 (two) times daily.   Marland Kitchen omeprazole (PRILOSEC) 40 MG capsule Take 40 mg by mouth daily.  . potassium chloride (K-DUR,KLOR-CON) 10 MEQ tablet Take 10 mEq by mouth 2 (two) times daily.  . predniSONE (DELTASONE) 5 MG tablet TAKE 3 TABLETS BY MOUTH EVERY DAY UNTIL COUGH HAS IMPROVED AND THEN GO BACK TO 7.5 DAILY.  . ranitidine  (ZANTAC) 150 MG tablet Take 150 mg by mouth at bedtime. Reported on 03/30/2016  . Respiratory Therapy Supplies (FLUTTER) DEVI Use as directed  . Tiotropium Bromide Monohydrate (SPIRIVA RESPIMAT) 2.5 MCG/ACT AERS Inhale 2 puffs into the lungs daily.  . [DISCONTINUED] predniSONE (DELTASONE) 2.5 MG tablet Take 3 tablets (7.5 mg total) by mouth daily with breakfast.   No facility-administered encounter medications on file as of 11/27/2017.      Review of Systems  Constitutional:   No  weight loss, night sweats,  Fevers, chills,  +fatigue, or  lassitude.  HEENT:   No headaches,  Difficulty swallowing,  Tooth/dental problems, or  Sore throat,                No sneezing, itching, ear ache,  +nasal congestion, post nasal drip,   CV:  No chest pain,  Orthopnea,  PND, swelling in lower extremities, anasarca, dizziness, palpitations, syncope.   GI  No heartburn, indigestion, abdominal pain, nausea, vomiting, diarrhea, change in bowel habits, loss of appetite, bloody stools.   Resp:   No chest wall deformity  Skin: no rash or lesions.  GU: no dysuria, change in color of urine, no urgency or frequency.  No flank pain, no hematuria   MS:  No joint pain or swelling.  No decreased range of motion.  No back pain.    Physical Exam  BP 130/68 (BP Location: Left Arm, Cuff Size: Normal)   Pulse (!) 101   Ht _0  (1.727 m)   Wt 205 lb 3.2 oz (93.1 kg)   SpO2 95%   BMI 31.20 kg/m   GEN: A/Ox3; pleasant , NAD, well nourished    HEENT:  [place]/AT,  EACs-clear, TMs-wnl, NOSE-clear, THROAT-clear, no lesions, no postnasal drip or exudate noted.   NECK:  Supple w/ fair ROM; no JVD; normal carotid impulses w/o bruits; no thyromegaly or nodules palpated; no lymphadenopathy.    RESP few trace rhonchi  no accessory muscle use, no dullness to percussion  CARD:  RRR, no m/r/g, no peripheral edema, pulses intact, no cyanosis or clubbing.  GI:   Soft & nt; nml bowel sounds; no organomegaly or masses  detected.   Musco: Warm bil, no deformities or joint swelling noted.   Neuro: alert, no focal deficits noted.    Skin: Warm, no lesions or rashes    Lab Results:   BMET   BNPImaging: No results found.   Assessment & Plan:   Cough variant asthma  vs UACS  Recent exacerbation with acute bronchitis/bronchiectasis Patient has been placed on daily azithromycin for recurrent bronchitis and bronchiectasis exacerbations. Clinically appears to be improving.  She is to continue her current regimen  Plan  Patient Instructions  Continue on current regimen  Follow up with Dr. Melvyn Novas  In 6-8 weeks and As needed        Asthma, chronic Continue on current regimen  Pulmonary sarcoidosis ([place]) Recent exacerbation with associated bronchiectasis exacerbation.  On chronic steroids.  Patient has slowly taper down to her baseline of 7.5 mg.  She has been on long-term steroids.  Patient education given.  Patient is doing well on next visit.  Can consider decreasing further at a very slow pace.  Hopefully  getting down to 5 mg daily.     Rexene Edison, NP 11/27/2017

## 2017-11-27 NOTE — Assessment & Plan Note (Signed)
Recent exacerbation with acute bronchitis/bronchiectasis Patient has been placed on daily azithromycin for recurrent bronchitis and bronchiectasis exacerbations. Clinically appears to be improving.  She is to continue her current regimen  Plan  Patient Instructions  Continue on current regimen  Follow up with Dr. Melvyn Novas  In 6-8 weeks and As needed

## 2017-11-27 NOTE — Progress Notes (Signed)
Chart and office note reviewed in detail  > agree with a/p as outlined

## 2017-11-27 NOTE — Patient Instructions (Signed)
Continue on current regimen  Follow up with Dr. Melvyn Novas  In 6-8 weeks and As needed

## 2017-11-27 NOTE — Assessment & Plan Note (Signed)
Continue on current regimen

## 2017-12-04 ENCOUNTER — Ambulatory Visit
Admission: RE | Admit: 2017-12-04 | Discharge: 2017-12-04 | Disposition: A | Discharge status: Home / Self Care | Payer: 59 | Source: Ambulatory Visit | Attending: Internal Medicine | Admitting: Internal Medicine

## 2017-12-04 ENCOUNTER — Encounter: Payer: Self-pay | Admitting: Internal Medicine

## 2017-12-04 ENCOUNTER — Ambulatory Visit: Payer: 59 | Admitting: Internal Medicine

## 2017-12-04 ENCOUNTER — Other Ambulatory Visit: Payer: Self-pay | Admitting: Internal Medicine

## 2017-12-04 VITALS — BP 150/90 | HR 96 | Ht 68.0 in | Wt 204.0 lb

## 2017-12-04 DIAGNOSIS — M546 Pain in thoracic spine: Secondary | ICD-10-CM | POA: Insufficient documentation

## 2017-12-04 DIAGNOSIS — X58XXXA Exposure to other specified factors, initial encounter: Secondary | ICD-10-CM | POA: Insufficient documentation

## 2017-12-04 DIAGNOSIS — S22000A Wedge compression fracture of unspecified thoracic vertebra, initial encounter for closed fracture: Secondary | ICD-10-CM | POA: Insufficient documentation

## 2017-12-04 DIAGNOSIS — S22009A Unspecified fracture of unspecified thoracic vertebra, initial encounter for closed fracture: Secondary | ICD-10-CM | POA: Diagnosis not present

## 2017-12-04 MED ORDER — METHOCARBAMOL 500 MG PO TABS: 500.0000 mg | ORAL_TABLET | Freq: Three times a day (TID) | ORAL | 0 refills | Status: DC | PRN

## 2017-12-04 NOTE — Progress Notes (Signed)
Date:  12/04/2017   Name:  Stacie [name]   DOB:  May 19, 1956   MRN:  [id number]   Chief Complaint: Back Pain (Started X 1 month ago. Off and on since. Happened after cough is getting better. Pain is located right below shoulder blades. When laying down or shifting the pain gets worse. Hurts when breathing. )  She can not recall a specific injury.  She has the most trouble getting comfortable to sleep.  She has taken tylenol but has not tried heat or ice.  It hurts to take a deep breath and to twist. She is on chronic prednisone 15 mg daily for sarcoidosis. Her cough is much improved on the new medical regimen.   Back Pain  This is a new problem. The current episode started more than 1 month ago. The problem occurs daily. The problem has been waxing and waning since onset. The pain is present in the thoracic spine. The quality of the pain is described as shooting and stabbing. The pain does not radiate. The pain is moderate. Pertinent negatives include no chest pain or fever.     Review of Systems  Constitutional: Negative for chills, fatigue and fever.  Respiratory: Positive for cough, chest tightness, shortness of breath and wheezing. Negative for choking.   Cardiovascular: Negative for chest pain and palpitations.  Musculoskeletal: Positive for back pain. Negative for myalgias.    Patient Active Problem List   Diagnosis Date Noted  . Morbid obesity due to excess calories ([place]) 05/08/2017  . Hyperlipidemia, mixed 04/21/2017  . Elevated blood pressure reading 04/20/2017  . Cough variant asthma  vs UACS  02/20/2017  . Hypothyroidism, acquired 03/27/2015  . H/O abnormal cervical Papanicolaou smear 03/27/2015  . H/O renal calculi 03/27/2015  . Allergic rhinitis 01/07/2015  . Obstructive sleep apnea 09/29/2014  . Other malaise and fatigue 06/03/2014  . Asthma, chronic 02/24/2014  . Cough 04/30/2013  . Pulmonary sarcoidosis ([place]) 03/12/2013  . CKD (chronic kidney disease) stage  3, GFR 30-59 ml/min ([facility]) 03/12/2013  . Calcium blood increased 05/23/2012  . Besnier-Boeck disease 02/08/2011    Prior to Admission medications   Medication Sig Start Date End Date Taking? Authorizing Provider  acetaminophen-codeine (TYLENOL #3) 300-30 MG tablet One every 4 hours as needed for cough 10/26/17  Yes Tanda Rockers, MD  albuterol (VENTOLIN HFA) 108 (90 Base) MCG/ACT inhaler Inhale 2 puffs into the lungs every 6 (six) hours as needed for wheezing. 11/19/15  Yes Laverle Hobby, MD  aspirin 81 MG tablet Take 81 mg by mouth daily.   Yes [provider]  budesonide-formoterol (SYMBICORT) 80-4.5 MCG/ACT inhaler Take 2 puffs first thing in am and then another 2 puffs about 12 hours later. 05/08/17  Yes Tanda Rockers, MD  cetirizine (ZYRTEC) 10 MG tablet Take 10 mg by mouth daily as needed.    Yes [provider]  GLUCOSAMINE PO Take 1 tablet by mouth daily.   Yes [provider]  levothyroxine (SYNTHROID) 137 MCG tablet Take 1 tablet (137 mcg total) by mouth daily before breakfast. 10/02/17  Yes Glean Hess, MD  mometasone (NASONEX) 50 MCG/ACT nasal spray Place 1 spray into the nose 2 (two) times daily.   Yes [provider]  Multiple Vitamin (MULTIVITAMIN) capsule Take 1 capsule by mouth 2 (two) times daily.    Yes [provider]  omeprazole (PRILOSEC) 40 MG capsule Take 40 mg by mouth daily.   Yes [provider]  predniSONE (  DELTASONE) 5 MG tablet TAKE 3 TABLETS BY MOUTH EVERY DAY UNTIL COUGH HAS IMPROVED AND THEN GO BACK TO 7.5 DAILY. 11/07/17  Yes Tanda Rockers, MD  ranitidine (ZANTAC) 150 MG tablet Take 150 mg by mouth at bedtime. Reported on 03/30/2016   Yes [provider]  Respiratory Therapy Supplies (FLUTTER) DEVI Use as directed 10/26/17  Yes Tanda Rockers, MD  Tiotropium Bromide Monohydrate (SPIRIVA RESPIMAT) 2.5 MCG/ACT AERS Inhale 2 puffs into the lungs daily. 05/29/17  Yes Tanda Rockers, MD     Allergies  Allergen Reactions  . Nitrofurantoin Nausea Only  . Tramadol     Nausea and vomiting  . Sulfa Antibiotics Rash    As an infant    Past Surgical History:  Procedure Laterality Date  . COLON SURGERY     "colon was fused to bladder - operated on both"  . PARS PLANA VITRECTOMY Right 05/20/2015   Procedure: PARS PLANA VITRECTOMY WITH 25 GAUGE, laser;  Surgeon: Milus Height, MD;  Location: [facility] ORS;  Service: Ophthalmology;  Laterality: Right;  . PARTIAL HYSTERECTOMY  1990  . TUBAL LIGATION      Social History   Tobacco Use  . Smoking status: Never Smoker  . Smokeless tobacco: Never Used  Substance Use Topics  . Alcohol use: No  . Drug use: No     Medication list has been reviewed and updated.  PHQ 2/9 Scores 04/20/2017  PHQ - 2 Score 0    Physical Exam  Constitutional: She is oriented to person, place, and time. She appears well-developed. No distress.  HENT:  Head: Normocephalic and atraumatic.  Neck: Normal range of motion. Neck supple.  Cardiovascular: Normal rate, regular rhythm and normal heart sounds.  Pulmonary/Chest: Effort normal. No accessory muscle usage. No respiratory distress. She has decreased breath sounds. She has wheezes.  Musculoskeletal: Normal range of motion.       Thoracic back: She exhibits no tenderness, no bony tenderness and no spasm.  Neurological: She is alert and oriented to person, place, and time.  Skin: Skin is warm and dry. No rash noted.  Psychiatric: She has a normal mood and affect. Her behavior is normal. Thought content normal.  Nursing note and vitals reviewed.   BP (!) 150/90   Pulse 96   Ht _0  (1.727 m)   Wt 204 lb (92.5 kg)   SpO2 95%   BMI 31.02 kg/m   Assessment and Plan: 1. Thoracic spine pain Need to rule out compression fx since on chronic prednisone Add robaxin, continue tylenol Try heat or ice - DG Thoracic Spine 4V; Future - methocarbamol (ROBAXIN) 500 MG tablet; Take 1 tablet (500 mg  total) by mouth every 8 (eight) hours as needed for muscle spasms.  Dispense: 60 tablet; Refill: 0   Meds ordered this encounter  Medications  . methocarbamol (ROBAXIN) 500 MG tablet    Sig: Take 1 tablet (500 mg total) by mouth every 8 (eight) hours as needed for muscle spasms.    Dispense:  60 tablet    Refill:  0    Partially dictated using Editor, commissioning. Any errors are unintentional.  Halina Maidens, MD [facility]  12/04/2017

## 2017-12-22 ENCOUNTER — Telehealth: Payer: Self-pay | Admitting: Internal Medicine

## 2017-12-22 NOTE — Telephone Encounter (Signed)
Need refill sent to [facility] on [address], was told to call if needing refill to help with her back?? Please Advise  acetaminophen-codeine (TYLENOL #3) 300-30 MG tablet [[id number]]

## 2017-12-22 NOTE — Telephone Encounter (Signed)
Because this is a controlled medication she needs to continue to get this from the doctor who started this medication. Please inform patient of this. Looks like Dr Melvyn Novas started her on this.   Thank you.

## 2018-01-02 ENCOUNTER — Other Ambulatory Visit: Payer: Self-pay | Admitting: Internal Medicine

## 2018-01-09 ENCOUNTER — Other Ambulatory Visit: Payer: Self-pay | Admitting: Internal Medicine

## 2018-01-12 ENCOUNTER — Other Ambulatory Visit: Payer: Self-pay | Admitting: Internal Medicine

## 2018-01-12 DIAGNOSIS — E039 Hypothyroidism, unspecified: Secondary | ICD-10-CM

## 2018-01-15 ENCOUNTER — Encounter: Payer: Self-pay | Admitting: Internal Medicine

## 2018-01-15 ENCOUNTER — Ambulatory Visit: Payer: 59 | Admitting: Internal Medicine

## 2018-01-15 VITALS — BP 128/78 | HR 100 | Ht 67.5 in | Wt 210.0 lb

## 2018-01-15 DIAGNOSIS — J45991 Cough variant asthma: Secondary | ICD-10-CM | POA: Diagnosis not present

## 2018-01-15 DIAGNOSIS — D860 Sarcoidosis of lung: Secondary | ICD-10-CM

## 2018-01-15 NOTE — Patient Instructions (Addendum)
Try off the spiriva to see if it makes a difference (think of like high octane fuel)  Ceiling for prednisone is never over 20 mg and floor is 7.5    Work on perfecting  inhaler technique:  relax and gently blow all the way out then take a nice smooth deep breath back in, triggering the inhaler at same time you start breathing in.  Hold for up to 5 seconds if you can. Blow out thru nose. Rinse and gargle with water when done       Please schedule a follow up office visit in 6 weeks, call sooner if needed with pfts on return

## 2018-01-15 NOTE — Progress Notes (Signed)
Subjective:     Patient ID: Stacie [name], female   DOB: 1956/02/20,  .   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 She was diagnosed with sarcoid in 2009 at the [place] [facility] via an EBUS guided fine needle aspiration of a hilar lymph node as well as an endobronchial biopsy which showed noncaseating granulomas with negative special stains for infectious organisms. She was treated off and on with steroids from 2009 2 2014 and then in 2014 was started on methotrexate in addition to Plaquenil. She also has chronic kidney disease which is believed to be directly related to sarcoidosis. She had no improvement with methotrexate and plaquenil and her TLC and DLCO declined slightly so in January 2015 she was started on imuran but this had to be held because of increasing LFTs. She ended up seeing Dr. Farrel Gobble at [facility] in 01/2014 for her sarcoid. He felt like her symptoms were more due to Asthma than Sarcoid. Plaquenil was restarted, prednisone tapered, and Advair changed to QVar. In 2016 she was admitted for community-acquired pneumonia and had in general worsening respiratory symptoms. She improved, but lung function testing had shown a fairly progressive decline in DLCO. Despite treatment for the pneumonia, she noted severe worsening dyspnea over several months. CellCept was started in June 2016 but not maintained     History of Present Illness  02/20/2017 acute extended ov/Stacie [name] re:   never smoker with sarcoid with airway involvement maint on mtx/pred/breo 200 on 10 mg daily  Chief Complaint  Patient presents with  . Acute Visit    Pt stop prednisone on Friday per dr orders. Pt has SOB with activity, fatigue that was occuring with the prednisone, but is now more pronounce being off the pred.  Pt has feet swelling  that occured with trip overse[place] 3 weeks ago.   each time she tapers prednisone below 10 per day x years develops  dry cough esp triggered by  exercise / speaking / not sleeping on cpap / sob with activity > slow adls rec Stop Breo and methotrexate and zithromax  Symbicort 80 Take 2 puffs first thing in am and then another 2 puffs about 12 hours later.  Work on inhaler technique:  Change prilosec to 40 mg Take 30- 60 min before your first  Meal and zantac 150 mg after supper or at bedtime  GERD diet  Prednisone 10 mg per day until better then try 10 mg on even and 5 mg on odd until return  Please remember to go to the lab and x-ray department downstairs in the basement  for your tests - we will call you with the results when they are available. Please schedule a follow up office visit in 4 weeks, sooner if needed  with all medications /inhalers/ solutions in hand so we can verify exactly what you are taking. This includes all medications from all doctors and over the counters    03/20/2017  f/u ov/Stacie [name] re:  Sarcoid vs cough variant asthma / vcd on symb 80 2bid but poor hfa/ singulair and "allergy drops" Chief Complaint  Patient presents with  . Follow-up    Coughing less but her breathing is unchanged. No new co's today.   does drops sl per ENT in [place] x 6 m Can't tell it's helping.  Still on 30 mg per day prednisone as misunderstood my instructions Since early spring 2018 was able to come off pred x one month only then back on Oct - feb 2018 did not do  much if any prednisone  Sleeps fine  Doe = MMRC1 = can walk nl pace, flat grade, can't hurry or go uphills or steps s sob  To control cough  rec Prednisone ceiling should be 20 mg and floor = 5 mg until you return  To get the most out of exercise, you need to be continuously aware that you are short of breath, but never out of breath, for 30 minutes daily.    Work on inhaler technique:   Please schedule a follow up office visit in 6  weeks, call sooner if needed with pfts on return  - add: next ov Ace level/ trial of gabapentin for cough as much of her problem may actually be fixed airflow obstruction with variable symptoms due to upper airway cough syndrome and not due to asthma related to sarcoid or otherwise.     05/08/2017  f/u ov/Stacie [name] re:  Sarcoid/ pred 5  And symb 80 2bid / max gerd rx  Chief Complaint  Patient presents with  . Follow-up    PFT's done today. Breathing worse since the last visit. She is coughing less and not really producing any sputum.  She feels very fatigued and states she feels SOB all of the time, esp with exertion. She notices wheezing at night.   symb 160 caused more cough w/in a week but then even before tapered pred down to 5 mg per day felt breathing worse assoc with some noct wheeze but never uses saba x if overdoes  It out in the heat rec Plan A = Automatic = symbicort 80 2 pffs each am and chase with spiriva 2.5 x 2 and then the symbiocrt 80 x 2 x 12 hours later  Plan B = Backup Only use your albuterol as a rescue medication  If breathing not improving consider restarting methotrexate (possibly through a rheumatologist or [place])  For now ceiling for prednisone is 20 mg and floor is 5 mg daily      08/28/2017  f/u ov/Stacie [name] re: sarcoid pred 7.5 / spiriva/ symb 80 2bid fine x months s singulair/ on gerd rx  Chief Complaint  Patient presents with  . Follow-up    Increased cough x 2-3 wks. She states developed a cold a month ago. She is coughing up cream colored sputum.     acutely worse x 2-3 weeks/ assoc with nasal discharge  Has saba/ rarely uses except  when over does it ie with ex  No problem at hs  rec Take delsym two tsp every 12 hours and supplement if needed with  Tylenol #3  up to 1/2 to 1  every 4 hours to suppress the urge to cough.   try reducing the tylenol #3  first,  then the delsym as tolerated.   Double the prednisone until better then work back to 7.5 mg daily  until return     10/26/2017 acute extended ov/Stacie [name] re:  Recurrent cough and sob  Chief Complaint  Patient presents with  . Acute Visit    2 grandkids recently had RSV, coughing, shortness of breath, congested    in the week before xmas "doing fine"  = no night time cough/ just coughing a few times a day with activity on tapered pred down to 7.5mg  while on  symb 80 and spiriva daily   Did not follow instruction re taper tyl #3  But eventually stopped all cough med and did ok until exp to sick kids  Then acutely worse Dec 25 high fever severe cough > ENT rec neb/ tussionex  No abx since then but in retrospect has done best while on zmax daily  Cough was originally dry and 24/7 and for the last week prior to OV  Turned slt yellow and day > noct  Turns out not on singulair x months s adverse effects  Mostly sob when coughing / neb not really helping rec zithromax 250 mg take two daily and one daily thereafter until return  Taper prednisone back down to 7.5mg  daily  For cough >  mucinex dm 1200 mg every 12 hours and flutter valve as much as possible whenever you are coughing  If still coughing > add on  tylenol #3 up to every 4 hours until not coughing x 3 days then stop  See Tammy NP in 4  weeks with all your medications, . Add: If symptoms not better needs HRCT/sinus ct          01/15/2018  f/u ov/Stacie [name] re:  Sarcoid/ chronic cough ? Better p one month z max  Chief Complaint  Patient presents with  . Follow-up    shortness of breath with activity. uses rescue inhaler around once a month  Dyspnea:  MMRC2 = can't walk a nl pace on a flat grade s sob but does fine slow and flat / can't do one flight of steps  Cough: less/ taking just one tylenol #3 per day (for back, not cough) at 10mg  per day pred vs floor of 7.5 mg  Sleep: ok flat on cpap/ 02  SABA use:   Rare albuterol    No obvious day to day or daytime variability or assoc excess/ purulent sputum or mucus plugs or hemoptysis or cp  or chest tightness, subjective wheeze or overt sinus or hb symptoms. No unusual exposure hx or h/o childhood pna/ asthma or knowledge of premature birth.  Sleeping ok flat without nocturnal  or early am exacerbation  of respiratory  c/o's or need for noct saba. Also denies any obvious fluctuation of symptoms with weather or environmental changes or other aggravating or alleviating factors except as outlined above   Current Allergies, Complete Past Medical History, Past Surgical History, Family History, and Social History were reviewed in [employer] record.  ROS  The following are not active complaints unless bolded Hoarseness, sore throat, dysphagia, dental problems, itching, sneezing,  nasal congestion or discharge of excess mucus or purulent secretions, ear ache,   fever, chills, sweats, unintended wt loss or wt gain, classically pleuritic or exertional cp,  orthopnea pnd or leg swelling, presyncope, palpitations, abdominal pain, anorexia, nausea, vomiting, diarrhea  or change in bowel habits or change in bladder habits, change in stools or change in urine, dysuria, hematuria,  rash, arthralgias, visual complaints, headache, numbness, weakness or ataxia or problems with walking or coordination,  change in mood/affect or memory.        Current Meds  Medication Sig  . acetaminophen-codeine (TYLENOL #3) 300-30 MG tablet One every 4 hours as needed for cough  . albuterol (VENTOLIN HFA) 108 (90 Base) MCG/ACT inhaler Inhale 2 puffs into the lungs every 6 (six) hours as needed for wheezing.  Marland Kitchen aspirin 81 MG tablet Take 81 mg by mouth daily.  . budesonide-formoterol (SYMBICORT) 80-4.5 MCG/ACT inhaler  Take 2 puffs first thing in am and then another 2 puffs about 12 hours later.  . cetirizine (ZYRTEC) 10 MG tablet Take 10 mg by mouth daily as needed.   Marland Kitchen GLUCOSAMINE PO Take 1 tablet by mouth daily.  Marland Kitchen levothyroxine (SYNTHROID) 137 MCG tablet Take 1 tablet (137 mcg total) by mouth  daily before breakfast.  . methocarbamol (ROBAXIN) 500 MG tablet Take 1 tablet (500 mg total) by mouth every 8 (eight) hours as needed for muscle spasms.  . mometasone (NASONEX) 50 MCG/ACT nasal spray Place 1 spray into the nose 2 (two) times daily.  . Multiple Vitamin (MULTIVITAMIN) capsule Take 1 capsule by mouth 2 (two) times daily.   Marland Kitchen omeprazole (PRILOSEC) 40 MG capsule Take 40 mg by mouth daily.  . predniSONE (DELTASONE) 2.5 MG tablet TAKE 3 TABLETS (7.5 MG TOTAL) BY MOUTH DAILY WITH BREAKFAST.  Marland Kitchen predniSONE (DELTASONE) 5 MG tablet 7.5 DAILY.  . ranitidine (ZANTAC) 150 MG tablet Take 150 mg by mouth at bedtime. Reported on 03/30/2016  . Respiratory Therapy Supplies (FLUTTER) DEVI Use as directed  . [  Tiotropium Bromide Monohydrate (SPIRIVA RESPIMAT) 2.5 MCG/ACT AERS Inhale 2 puffs into the lungs daily.                      Objective:   Physical Exam  Amb wf nad all smiles   01/15/2018         210  10/26/2017       201  08/30/2017     208  05/08/2017       209  03/20/2017         206   02/20/17 205 lb 12.8 oz (93.4 kg)  08/30/16 202 lb (91.6 kg)  05/26/16 184 lb 9.6 oz (83.7 kg)    Vital signs reviewed - Note on arrival 02 sats  92% on RA        HEENT: nl dentition,   and oropharynx. Nl external ear canals without cough reflex - mild bilateral non-specific turbinate edema     NECK :  without JVD/Nodes/TM/ nl carotid upstrokes bilaterally   LUNGS: no acc muscle use,  Nl contour chest with a few pops and squeaks on insp and min exp rhonchi bilaterally s cough on insp or exp    CV:  RRR  no s3 or murmur or increase in P2, and no edema   ABD:  soft and nontender with nl inspiratory excursion in the supine position. No bruits or organomegaly appreciated, bowel sounds nl  MS:  Nl gait/ ext warm without deformities, calf tenderness, cyanosis or clubbing No obvious joint restrictions   SKIN: warm and dry without lesions    NEURO:  alert, approp, nl sensorium with  no  motor or cerebellar deficits apparent.              .    Assessment:

## 2018-01-16 ENCOUNTER — Telehealth: Payer: Self-pay | Admitting: Internal Medicine

## 2018-01-16 ENCOUNTER — Other Ambulatory Visit: Payer: Self-pay | Admitting: Internal Medicine

## 2018-01-16 MED ORDER — AZITHROMYCIN 250 MG PO TABS: 250.0000 mg | ORAL_TABLET | Freq: Every day | ORAL | 1 refills | Status: DC

## 2018-01-16 NOTE — Telephone Encounter (Signed)
Spoke with pt. She is needing a refill on Zithromax. MW wanted her to take this daily until her return OV. This is documented in the pt's OV note. Rx has been sent in. Nothing further was needed at this time.

## 2018-01-17 ENCOUNTER — Encounter: Payer: Self-pay | Admitting: Internal Medicine

## 2018-01-17 NOTE — Assessment & Plan Note (Signed)
Diagnosed 2009 [facility] with EBUS FNA of a hilar lymph node as well as an endobronchial lesion showing noncaseating granulomas, special stains were negative for fungal or AFB Prednisone off and on 2003-2014 11/2012 MTX/HCQ started [facility] Kidney involvement 03/12/2013 6 MW > 1500 feet, O2 saturation nadir 88% RA, HR max 132 03/2013 PFT > Ratio 66%, FEV 2.2L (82% pred), TLC 4.62 L (81% pred), DLCO 16.7 68% 09/2013 PFT > ratio 63%, FEV1 1.99> 2.08L with bronchodilator (76% predicted, 5%), TLC 4.25L (72% pred), DLCO 15.5 (64% pred) 09/2013 TPMT activity 19 > normal 10/2013 Stop MTX, start Imuran> goal dose is 143m/day (renal adjustment); plan start 534mdaily, increase 2546mvery 3 weeks; weekly CBC/CMET until 125m40my, then q-12 weeks;  11/2013 - 12/2013 > held Imuran due to LFT abnormalities 02/24/2014 CXR> improved bilateral airspace disease, upper lobe scarring noted 02/24/2014 PFT> Ratio FEV1 1.99L (75% pred, 12% change with BD), TLC 4.04 L (71% pred), DLCO 16.9 (71% pred) 06/2014 Echo> LVEF 50-55%, normal global LVF funtion, mild MR, estimated PA systolic 33.814.7ld TR, RV size normal 06/2014 PFT> ratio 55%, FEV1 1.39L (51% pred), FVC 2.52L (69%pred), TLCO 4.41L (75% pred), DLCO 64% pred 10/2014 PFT> Ratio 64% FEV1 1.52L (57% pred), FVC 2.39L (67% pred), TLC 3.58 (63% pred), DLCO 12.7 (53% pred) 10/2014 6MW> [id number] 83% RA 02/22/2014 HRCT [facility]> significant improvement in GGO compared to before, chronic changes persist consistent with sarcoid, 4mm n51mle, increased PA diameter - 02/20/2017 Angiotensin level  54 >>   try off mtx/ wean prednisone with max of 20 and min of 5 mg daily  - .PFT's  05/08/2017  FEV1 1.29 (44 % ) ratio 57  p 9 % improvement from saba p symb 80 prior to study with DLCO  52/50 % corrects to 73  % for alv volume - 05/08/2017  After extensive coaching device effectiveness =    90% with SMI > try spiriva respimat 2.5 x 2 each am and if not improving then [place] rx, reviewed in detail with pt  > not really clear the spiriva was the answer even in retrospect but since this wasn't COPD ok too Stop it and see what difference if any she notices any of her symptoms and if so restart it.  The goal with a chronic steroid dependent illness is always arriving at the lowest effective dose that controls the disease/symptoms and not accepting a set "formula" which is based on statistics or guidelines that don't always take into account patient  variability or the natural hx of the dz in every individual patient, which may well vary over time.  For now therefore I recommend the patient maintain  Ceiling of 20 mg and floor of 7.5 mg daily    I had an extended discussion with the patient reviewing all relevant studies completed to date and  lasting 15 to 20 minutes of a 25 minute visit    Each maintenance medication was reviewed in detail including most importantly the difference between maintenance and prns and under what circumstances the prns are to be triggered using an action plan format that is not reflected in the computer generated alphabetically organized AVS.    Please see AVS for specific instructions unique to this visit that I personally wrote and verbalized to the the pt in detail and then reviewed with pt  by my nurse highlighting any  changes in therapy recommended at today's visit to their  plan of care.

## 2018-01-17 NOTE — Assessment & Plan Note (Addendum)
Spirometry 02/20/2017  FEV1 1.25 (44%)  Ratio 59 p Breo 200 - FENO 02/20/2017  =   12 p Breo 200  - 02/20/2017  After extensive coaching HFA effectiveness = 75% from baseline 25%    So try symb 80 and stop breo 200  - 03/20/2017  After extensive coaching HFA effectiveness =    75% from a baseline of 50%  - Spirometry 03/20/2017  FEV1 1.38 (48%)  Ratio 56 p am symb but only 50%  Effective technique at baseline  - Changed to symb 160 2bid 04/04/2017 > cough worse so back to 80 2bid w/in a few weeks  - Spirometry 05/08/2017  FEV1 1.29 (44%)  Ratio 57 p am symbicort 80 x 2 pffs and pred 5 mg daily   - added spriva 2.5 trial 05/08/2017  - trial of daily zmax 10/26/2017 And flutter valve with training > only took x one month  - The proper method of use, as well as anticipated side effects, of a metered-dose inhaler are discussed and demonstrated to the patient. Improved effectiveness after extensive coaching during this visit to a level of approximately 90 % from a baseline of 75 %      Adequate control on present rx, reviewed in detail with pt > no change in rx needed  > not clear the zmax helped but she is much better today for the first time and I recommended that she restart it daily   rather than escalate other aspects of her care as a first step then return in 6 weeks with f/u pfts

## 2018-01-31 ENCOUNTER — Other Ambulatory Visit: Payer: Self-pay | Admitting: Student

## 2018-01-31 DIAGNOSIS — M4854XD Collapsed vertebra, not elsewhere classified, thoracic region, subsequent encounter for fracture with routine healing: Secondary | ICD-10-CM

## 2018-01-31 DIAGNOSIS — M5414 Radiculopathy, thoracic region: Secondary | ICD-10-CM

## 2018-02-09 ENCOUNTER — Ambulatory Visit
Admission: RE | Admit: 2018-02-09 | Discharge: 2018-02-09 | Disposition: A | Discharge status: Home / Self Care | Payer: Commercial Managed Care - HMO | Source: Ambulatory Visit | Attending: Student | Admitting: Student

## 2018-02-09 DIAGNOSIS — M4854XD Collapsed vertebra, not elsewhere classified, thoracic region, subsequent encounter for fracture with routine healing: Secondary | ICD-10-CM | POA: Diagnosis present

## 2018-02-09 DIAGNOSIS — M4724 Other spondylosis with radiculopathy, thoracic region: Secondary | ICD-10-CM | POA: Insufficient documentation

## 2018-02-09 DIAGNOSIS — M5414 Radiculopathy, thoracic region: Secondary | ICD-10-CM

## 2018-02-19 ENCOUNTER — Telehealth: Payer: Self-pay | Admitting: Internal Medicine

## 2018-02-19 NOTE — Telephone Encounter (Signed)
Patient is requesting  acetaminophen-codeine (TYLENOL #3) 300-30 MG tablet [[id number]]   Order Details  Dose, Route, Frequency: As Directed   Dispense Quantity: 40 tablet Refills: 0 Fills remaining: --        Sig: One every 4 hours as needed for cough       Written Date: 10/26/17 Expiration Date: 04/24/18    Start Date: 10/26/17 End Date: --         Ordering Provider:  -- DEA #:  [id number] NPI:  --   Authorizing Provider:  Tanda Rockers, MD DEA #:  [id number] NPI:  [id number]   Ordering User:  Tanda Rockers, MD          Diagnosis Association: Cough variant asthma vs UACS     Original Order:  acetaminophen-codeine (TYLENOL #3) 300-30 MG tablet [[id number]]    [id number] [place]  [id number]   Pharmacy Comments:  --       Fill quantity remaining:  -- Fill quantity used:  --

## 2018-02-19 NOTE — Telephone Encounter (Signed)
Will come in for back pain

## 2018-02-20 ENCOUNTER — Ambulatory Visit: Payer: 59 | Admitting: Internal Medicine

## 2018-02-20 ENCOUNTER — Encounter: Payer: Self-pay | Admitting: Internal Medicine

## 2018-02-20 VITALS — BP 142/86 | HR 98 | Resp 16 | Ht 67.5 in | Wt 208.0 lb

## 2018-02-20 DIAGNOSIS — S22000A Wedge compression fracture of unspecified thoracic vertebra, initial encounter for closed fracture: Secondary | ICD-10-CM

## 2018-02-20 MED ORDER — ACETAMINOPHEN-CODEINE 300-30 MG PO TABS: 1.0000 | ORAL_TABLET | ORAL | 0 refills | Status: AC | PRN

## 2018-02-20 NOTE — Progress Notes (Signed)
Date:  02/20/2018   Name:  Stacie [name]   DOB:  21-Jul-1956   MRN:  [id number]   Chief Complaint: Back Pain (Dr.Poole will see her on [place] but family feels the anxiety with the pain is an issue. Needs Tylenol 3 )  Back Pain  This is a new (since January) problem. The problem occurs constantly. The problem has been gradually worsening since onset. The pain is present in the thoracic spine (compression fractures). Pertinent negatives include no chest pain, fever, numbness or weakness.  Seeing Dr. Trenton [name] in [place] next week for consideration of kyphoplasty.  However, she is out of pain medications. She is also seeing Rheum this week to discuss treatment for prednisone induced OP.    Review of Systems  Constitutional: Positive for fatigue. Negative for chills and fever.  Respiratory: Positive for cough, chest tightness, shortness of breath and wheezing.   Cardiovascular: Negative for chest pain and palpitations.  Musculoskeletal: Positive for back pain.  Neurological: Negative for tremors, weakness and numbness.    Patient Active Problem List   Diagnosis Date Noted  . Compression fx, thoracic spine ([place]) 12/04/2017  . Morbid obesity due to excess calories ([place]) 05/08/2017  . Hyperlipidemia, mixed 04/21/2017  . Elevated blood pressure reading 04/20/2017  . Cough variant asthma  vs UACS  02/20/2017  . Hypothyroidism, acquired 03/27/2015  . H/O abnormal cervical Papanicolaou smear 03/27/2015  . H/O renal calculi 03/27/2015  . Allergic rhinitis 01/07/2015  . Obstructive sleep apnea 09/29/2014  . Other malaise and fatigue 06/03/2014  . Asthma, chronic 02/24/2014  . Cough 04/30/2013  . Pulmonary sarcoidosis ([place]) 03/12/2013  . CKD (chronic kidney disease) stage 3, GFR 30-59 ml/min (HCC) 03/12/2013  . Calcium blood increased 05/23/2012  . Besnier-Boeck disease 02/08/2011    Prior to Admission medications   Medication Sig Start Date End Date Taking?  Authorizing Provider  acetaminophen-codeine (TYLENOL #3) 300-30 MG tablet One every 4 hours as needed for cough 10/26/17  Yes Tanda Rockers, MD  albuterol (VENTOLIN HFA) 108 (90 Base) MCG/ACT inhaler Inhale 2 puffs into the lungs every 6 (six) hours as needed for wheezing. 11/19/15  Yes Laverle Hobby, MD  aspirin 81 MG tablet Take 81 mg by mouth daily.   Yes [provider]  budesonide-formoterol (SYMBICORT) 80-4.5 MCG/ACT inhaler Take 2 puffs first thing in am and then another 2 puffs about 12 hours later. 05/08/17  Yes Tanda Rockers, MD  cetirizine (ZYRTEC) 10 MG tablet Take 10 mg by mouth daily as needed.    Yes [provider]  GLUCOSAMINE PO Take 1 tablet by mouth daily.   Yes [provider]  levothyroxine (SYNTHROID) 137 MCG tablet Take 1 tablet (137 mcg total) by mouth daily before breakfast. 10/02/17  Yes Glean Hess, MD  mometasone (NASONEX) 50 MCG/ACT nasal spray Place 1 spray into the nose 2 (two) times daily.   Yes [provider]  Multiple Vitamin (MULTIVITAMIN) capsule Take 1 capsule by mouth 2 (two) times daily.    Yes [provider]  omeprazole (PRILOSEC) 40 MG capsule Take 40 mg by mouth daily.   Yes [provider]  ranitidine (ZANTAC) 150 MG tablet Take 150 mg by mouth at bedtime. Reported on 03/30/2016   Yes [provider]  Respiratory Therapy Supplies (FLUTTER) DEVI Use as directed 10/26/17  Yes Tanda Rockers, MD    Allergies  Allergen Reactions  . Nitrofurantoin Nausea Only  . Tramadol  Nausea and vomiting  . Sulfa Antibiotics Rash    As an infant    Past Surgical History:  Procedure Laterality Date  . COLON SURGERY     "colon was fused to bladder - operated on both"  . PARS PLANA VITRECTOMY Right 05/20/2015   Procedure: PARS PLANA VITRECTOMY WITH 25 GAUGE, laser;  Surgeon: Milus Height, MD;  Location: [facility] ORS;  Service: Ophthalmology;  Laterality: Right;  . PARTIAL HYSTERECTOMY   1990  . TUBAL LIGATION      Social History   Tobacco Use  . Smoking status: Never Smoker  . Smokeless tobacco: Never Used  Substance Use Topics  . Alcohol use: No  . Drug use: No     Medication list has been reviewed and updated.  PHQ 2/9 Scores 04/20/2017  PHQ - 2 Score 0    Physical Exam  Constitutional: She appears well-developed. No distress.  Cardiovascular: Normal rate, regular rhythm and normal heart sounds.  Pulmonary/Chest: She has decreased breath sounds. She has no rhonchi.  Musculoskeletal:       Thoracic back: She exhibits tenderness.    BP (!) 142/86   Pulse 98   Resp 16   Ht 5' 7.5" (1.715 m)   Wt 208 lb (94.3 kg)   SpO2 97%   BMI 32.10 kg/m   Assessment and Plan: 1. Compression fracture of body of thoracic vertebra (HCC) Proceed with neurosurgery evaluation and probably kyphoplasty Rheum for OP treatment recommendations - Acetaminophen-Codeine (TYLENOL/CODEINE #3) 300-30 MG tablet; Take 1 tablet by mouth every 4 (four) hours as needed for up to 5 days for pain.  Dispense: 30 tablet; Refill: 0   Meds ordered this encounter  Medications  . Acetaminophen-Codeine (TYLENOL/CODEINE #3) 300-30 MG tablet    Sig: Take 1 tablet by mouth every 4 (four) hours as needed for up to 5 days for pain.    Dispense:  30 tablet    Refill:  0    Partially dictated using Editor, commissioning. Any errors are unintentional.  Halina Maidens, MD [facility]  02/20/2018

## 2018-03-06 ENCOUNTER — Ambulatory Visit: Payer: 59 | Admitting: Internal Medicine

## 2018-03-30 ENCOUNTER — Other Ambulatory Visit: Payer: Self-pay | Admitting: Internal Medicine

## 2018-04-02 ENCOUNTER — Ambulatory Visit: Payer: 59 | Admitting: Internal Medicine

## 2018-04-02 ENCOUNTER — Ambulatory Visit
Admission: RE | Admit: 2018-04-02 | Discharge: 2018-04-02 | Disposition: A | Discharge status: Home / Self Care | Payer: 59 | Source: Ambulatory Visit | Attending: Internal Medicine | Admitting: Internal Medicine

## 2018-04-02 ENCOUNTER — Other Ambulatory Visit: Payer: Self-pay | Admitting: Internal Medicine

## 2018-04-02 ENCOUNTER — Encounter: Payer: Self-pay | Admitting: Internal Medicine

## 2018-04-02 VITALS — BP 136/82 | HR 104 | Temp 98.6°F | Resp 16 | Ht 66.5 in | Wt 198.0 lb

## 2018-04-02 DIAGNOSIS — S22000G Wedge compression fracture of unspecified thoracic vertebra, subsequent encounter for fracture with delayed healing: Secondary | ICD-10-CM

## 2018-04-02 DIAGNOSIS — I10 Essential (primary) hypertension: Secondary | ICD-10-CM | POA: Diagnosis not present

## 2018-04-02 DIAGNOSIS — X58XXXD Exposure to other specified factors, subsequent encounter: Secondary | ICD-10-CM | POA: Diagnosis not present

## 2018-04-02 DIAGNOSIS — J45991 Cough variant asthma: Secondary | ICD-10-CM

## 2018-04-02 MED ORDER — ACETAMINOPHEN-CODEINE 300-30 MG PO TABS: 1.0000 | ORAL_TABLET | ORAL | 0 refills | Status: DC | PRN

## 2018-04-02 NOTE — Progress Notes (Signed)
Date:  04/02/2018   Name:  Stacie [name]   DOB:  10/12/1956   MRN:  [id number]   Chief Complaint: Back Pain and Hypertension Back Pain  This is a recurrent problem. The pain is present in the lumbar spine and thoracic spine. The quality of the pain is described as cramping. Radiates to: radiates to lateral abdomen just under breasts. The pain is moderate. The symptoms are aggravated by twisting and standing. Pertinent negatives include no chest pain, fever or headaches. Risk factors include history of osteoporosis. Treatments tried: had kyphoplasty at T7 and T8 three weeks ago.  Pain was better for several weeks but one week ago began again, slightly lower with radiation around both sides to abdomen.  Hypertension  This is a new problem. The current episode started more than 1 month ago. The problem has been gradually improving since onset. The problem is controlled. Associated symptoms include shortness of breath. Pertinent negatives include no chest pain, headaches or palpitations. Past treatments include calcium channel blockers. The current treatment provides significant (but she has noticed mild ankle and foot edema) improvement. There are no compliance problems.   Shortness of Breath  This is a chronic problem. The problem has been unchanged. Associated symptoms include leg swelling (since starting amlodipine) and wheezing. Pertinent negatives include no chest pain, fever, headaches or rash. Her past medical history is significant for asthma and chronic lung disease. (Starting pulmonary rehab)      Review of Systems  Constitutional: Negative for chills and fever.  Respiratory: Positive for shortness of breath and wheezing. Negative for chest tightness.   Cardiovascular: Positive for leg swelling (since starting amlodipine). Negative for chest pain and palpitations.  Musculoskeletal: Positive for back pain.  Skin: Negative for color change and rash.  Neurological: Negative for  dizziness and headaches.  Psychiatric/Behavioral: Positive for decreased concentration and dysphoric mood. Negative for self-injury, sleep disturbance and suicidal ideas. The patient is not nervous/anxious.     Patient Active Problem List   Diagnosis Date Noted  . Compression fx, thoracic spine ([place]) 12/04/2017  . Morbid obesity due to excess calories ([place]) 05/08/2017  . Hyperlipidemia, mixed 04/21/2017  . Elevated blood pressure reading 04/20/2017  . Cough variant asthma  vs UACS  02/20/2017  . Hypothyroidism, acquired 03/27/2015  . H/O abnormal cervical Papanicolaou smear 03/27/2015  . H/O renal calculi 03/27/2015  . Allergic rhinitis 01/07/2015  . Obstructive sleep apnea 09/29/2014  . Other malaise and fatigue 06/03/2014  . Asthma, chronic 02/24/2014  . Cough 04/30/2013  . Pulmonary sarcoidosis ([place]) 03/12/2013  . CKD (chronic kidney disease) stage 3, GFR 30-59 ml/min ([facility]) 03/12/2013  . Calcium blood increased 05/23/2012  . Besnier-Boeck disease 02/08/2011    Prior to Admission medications   Medication Sig Start Date End Date Taking? Authorizing Provider  albuterol (VENTOLIN HFA) 108 (90 Base) MCG/ACT inhaler Inhale 2 puffs into the lungs every 6 (six) hours as needed for wheezing. 11/19/15  Yes Laverle Hobby, MD  amLODipine (NORVASC) 10 MG tablet Take 10 mg by mouth daily. 03/22/18  Yes [provider]  aspirin 81 MG tablet Take 81 mg by mouth daily.   Yes [provider]  budesonide-formoterol (SYMBICORT) 80-4.5 MCG/ACT inhaler Take 2 puffs first thing in am and then another 2 puffs about 12 hours later. 05/08/17  Yes Tanda Rockers, MD  cetirizine (ZYRTEC) 10 MG tablet Take 10 mg by mouth daily as needed.    Yes [provider]  FORTEO 600  MCG/2.4ML SOLN  03/11/18  Yes [provider]  GLUCOSAMINE PO Take 1 tablet by mouth daily.   Yes [provider]  HYDROcodone-acetaminophen (NORCO/VICODIN) 5-325 MG tablet Take 1 tablet by  mouth every 4 (four) hours as needed. 03/06/18  Yes [provider]  mometasone (NASONEX) 50 MCG/ACT nasal spray Place 1 spray into the nose 2 (two) times daily.   Yes [provider]  Multiple Vitamin (MULTIVITAMIN) capsule Take 1 capsule by mouth 2 (two) times daily.    Yes [provider]  omeprazole (PRILOSEC) 40 MG capsule Take 40 mg by mouth daily.   Yes [provider]  predniSONE (DELTASONE) 10 MG tablet Take by mouth. 07/18/16  Yes [provider]  ranitidine (ZANTAC) 150 MG tablet Take 150 mg by mouth at bedtime. Reported on 03/30/2016   Yes [provider]  Respiratory Therapy Supplies (FLUTTER) DEVI Use as directed 10/26/17  Yes Tanda Rockers, MD  SYMBICORT 80-4.5 MCG/ACT inhaler TAKE 2 PUFFS BY MOUTH TWICE A DAY 03/30/18  Yes Tanda Rockers, MD  SYNTHROID 137 MCG tablet TAKE 1 TABLET (137 MCG TOTAL) BY MOUTH DAILY BEFORE BREAKFAST. 04/02/18  Yes Glean Hess, MD    Allergies  Allergen Reactions  . Nitrofurantoin Nausea Only  . Tramadol     Nausea and vomiting  . Sulfa Antibiotics Rash    As an infant    Past Surgical History:  Procedure Laterality Date  . COLON SURGERY     "colon was fused to bladder - operated on both"  . PARS PLANA VITRECTOMY Right 05/20/2015   Procedure: PARS PLANA VITRECTOMY WITH 25 GAUGE, laser;  Surgeon: Milus Height, MD;  Location: [facility] ORS;  Service: Ophthalmology;  Laterality: Right;  . PARTIAL HYSTERECTOMY  1990  . TUBAL LIGATION      Social History   Tobacco Use  . Smoking status: Never Smoker  . Smokeless tobacco: Never Used  Substance Use Topics  . Alcohol use: No  . Drug use: No     Medication list has been reviewed and updated.  Current Meds  Medication Sig  . albuterol (VENTOLIN HFA) 108 (90 Base) MCG/ACT inhaler Inhale 2 puffs into the lungs every 6 (six) hours as needed for wheezing.  Marland Kitchen amLODipine (NORVASC) 10 MG tablet Take 10 mg by mouth daily.  Marland Kitchen aspirin 81 MG  tablet Take 81 mg by mouth daily.  . budesonide-formoterol (SYMBICORT) 80-4.5 MCG/ACT inhaler Take 2 puffs first thing in am and then another 2 puffs about 12 hours later.  . cetirizine (ZYRTEC) 10 MG tablet Take 10 mg by mouth daily as needed.   Marland Kitchen FORTEO 600 MCG/2.4ML SOLN   . GLUCOSAMINE PO Take 1 tablet by mouth daily.  Marland Kitchen HYDROcodone-acetaminophen (NORCO/VICODIN) 5-325 MG tablet Take 1 tablet by mouth every 4 (four) hours as needed.  . mometasone (NASONEX) 50 MCG/ACT nasal spray Place 1 spray into the nose 2 (two) times daily.  . Multiple Vitamin (MULTIVITAMIN) capsule Take 1 capsule by mouth 2 (two) times daily.   Marland Kitchen omeprazole (PRILOSEC) 40 MG capsule Take 40 mg by mouth daily.  . predniSONE (DELTASONE) 10 MG tablet Take by mouth.  . ranitidine (ZANTAC) 150 MG tablet Take 150 mg by mouth at bedtime. Reported on 03/30/2016  . Respiratory Therapy Supplies (FLUTTER) DEVI Use as directed  . SYMBICORT 80-4.5 MCG/ACT inhaler TAKE 2 PUFFS BY MOUTH TWICE A DAY  . SYNTHROID 137 MCG tablet TAKE 1 TABLET (137 MCG TOTAL) BY MOUTH DAILY BEFORE  BREAKFAST.    PHQ 2/9 Scores 04/20/2017  PHQ - 2 Score 0    Physical Exam  Constitutional: She is oriented to person, place, and time. She appears well-developed. No distress.  HENT:  Head: Normocephalic and atraumatic.  Cardiovascular: Normal rate, regular rhythm and normal heart sounds.  Pulmonary/Chest: Effort normal. No respiratory distress. She has no decreased breath sounds. She has wheezes in the right upper field and the left lower field. She has no rhonchi.  Musculoskeletal: Normal range of motion. She exhibits edema (trace ankle edema).       Thoracic back: She exhibits no bony tenderness and no spasm.  Neurological: She is alert and oriented to person, place, and time.  Skin: Skin is warm and dry. No rash noted.  Psychiatric: She has a normal mood and affect. Her behavior is normal. Thought content normal.  Nursing note and vitals  reviewed.   BP 136/82   Pulse (!) 104   Temp 98.6 F (37 C) (Oral)   Resp 16   Ht 5' 6.5" (1.689 m)   Wt 198 lb (89.8 kg)   SpO2 93%   BMI 31.48 kg/m   Assessment and Plan: 1. Closed compression fracture of thoracic vertebra with delayed healing, subsequent encounter Obtain xrays of thoracic spine Begin tylenol #3 prn pain Follow up with Dr. Trenton Gammon [facility] in [place] as planned - Acetaminophen-Codeine (TYLENOL/CODEINE #3) 300-30 MG tablet; Take 1 tablet by mouth every 4 (four) hours as needed for pain.  Dispense: 42 tablet; Refill: 0  2. Essential hypertension Improved but with mild edema - continue to monitor for worsening Elevate feet when able  3. Cough variant asthma  vs UACS  Continue current care with Pulmonology Handicapped parking placard application given   Meds ordered this encounter  Medications  . Acetaminophen-Codeine (TYLENOL/CODEINE #3) 300-30 MG tablet    Sig: Take 1 tablet by mouth every 4 (four) hours as needed for pain.    Dispense:  42 tablet    Refill:  0    Partially dictated using Editor, commissioning. Any errors are unintentional.  Halina Maidens, MD [facility]  04/02/2018

## 2018-04-03 ENCOUNTER — Encounter: Payer: 59 | Attending: Internal Medicine

## 2018-04-03 ENCOUNTER — Other Ambulatory Visit: Payer: Self-pay

## 2018-04-03 VITALS — Ht 66.5 in | Wt 198.7 lb

## 2018-04-03 DIAGNOSIS — D860 Sarcoidosis of lung: Secondary | ICD-10-CM | POA: Diagnosis not present

## 2018-04-03 DIAGNOSIS — R0602 Shortness of breath: Secondary | ICD-10-CM | POA: Insufficient documentation

## 2018-04-03 NOTE — Patient Instructions (Signed)
Patient Instructions  Patient Details  Name: Stacie [name] MRN: [id number] Date of Birth: 09/19/1956 Referring Provider:  Donnald Garre, MD  Below are your personal goals for exercise, nutrition, and risk factors. Our goal is to help you stay on track towards obtaining and maintaining these goals. We will be discussing your progress on these goals with you throughout the program.  Initial Exercise Prescription: Initial Exercise Prescription - 04/03/18 1300      Date of Initial Exercise RX and Referring Provider   Date  04/03/18    Referring Provider  [place]      Treadmill   MPH  1.5    Grade  0    Minutes  15    METs  2.15      NuStep   Level  2    SPM  80    Minutes  15    METs  2.1      Biostep-RELP   Level  2    SPM  50    Minutes  15    METs  2      Prescription Details   Frequency (times per week)  3    Duration  Progress to 45 minutes of aerobic exercise without signs/symptoms of physical distress      Intensity   THRR 40-80% of Max Heartrate  135-150    Ratings of Perceived Exertion  11-15    Perceived Dyspnea  0-4      Resistance Training   Training Prescription  Yes    Weight  3 lb    Reps  10-15       Exercise Goals: Frequency: Be able to perform aerobic exercise two to three times per week in program working toward 2-5 days per week of home exercise.  Intensity: Work with a perceived exertion of 11 (fairly light) - 15 (hard) while following your exercise prescription.  We will make changes to your prescription with you as you progress through the program.   Duration: Be able to do 30 to 45 minutes of continuous aerobic exercise in addition to a 5 minute warm-up and a 5 minute cool-down routine.   Nutrition Goals: Your personal nutrition goals will be established when you do your nutrition analysis with the dietician.  The following are general nutrition guidelines to follow: Cholesterol < 219m/day Sodium < 15063mday Fiber: Women over  50 yrs - 21 grams per day  Personal Goals: Personal Goals and Risk Factors at Admission - 04/03/18 1118      Core Components/Risk Factors/Patient Goals on Admission    Weight Management  Yes;Weight Loss;Weight Maintenance    Intervention  Weight Management: Develop a combined nutrition and exercise program designed to reach desired caloric intake, while maintaining appropriate intake of nutrient and fiber, sodium and fats, and appropriate energy expenditure required for the weight goal.;Weight Management: Provide education and appropriate resources to help participant work on and attain dietary goals.;Weight Management/Obesity: Establish reasonable short term and long term weight goals.    Admit Weight  198 lb 11.2 oz (90.1 kg)    Goal Weight: Short Term  193 lb (87.5 kg)    Goal Weight: Long Term  150 lb (68 kg)    Expected Outcomes  Weight Maintenance: Understanding of the daily nutrition guidelines, which includes 25-35% calories from fat, 7% or less cal from saturated fats, less than 20058mholesterol, less than 1.5gm of sodium, & 5 or more servings of fruits and vegetables daily;Short Term: Continue to assess  and modify interventions until short term weight is achieved;Long Term: Adherence to nutrition and physical activity/exercise program aimed toward attainment of established weight goal;Weight Loss: Understanding of general recommendations for a balanced deficit meal plan, which promotes 1-2 lb weight loss per week and includes a negative energy balance of [id number] kcal/d;Understanding recommendations for meals to include 15-35% energy as protein, 25-35% energy from fat, 35-60% energy from carbohydrates, less than 264m of dietary cholesterol, 20-35 gm of total fiber daily;Understanding of distribution of calorie intake throughout the day with the consumption of 4-5 meals/snacks    Improve shortness of breath with ADL's  Yes    Intervention  Provide education, individualized exercise plan and  daily activity instruction to help decrease symptoms of SOB with activities of daily living.    Expected Outcomes  Short Term: Improve cardiorespiratory fitness to achieve a reduction of symptoms when performing ADLs;Long Term: Be able to perform more ADLs without symptoms or delay the onset of symptoms    Hypertension  Yes    Intervention  Provide education on lifestyle modifcations including regular physical activity/exercise, weight management, moderate sodium restriction and increased consumption of fresh fruit, vegetables, and low fat dairy, alcohol moderation, and smoking cessation.;Monitor prescription use compliance.    Expected Outcomes  Short Term: Continued assessment and intervention until BP is < 140/962mHG in hypertensive participants. < 130/8029mG in hypertensive participants with diabetes, heart failure or chronic kidney disease.;Long Term: Maintenance of blood pressure at goal levels.       Tobacco Use Initial Evaluation: Social History   Tobacco Use  Smoking Status Never Smoker  Smokeless Tobacco Never Used    Exercise Goals and Review: Exercise Goals    Row Name 04/03/18 1300             Exercise Goals   Increase Physical Activity  Yes       Intervention  Provide advice, education, support and counseling about physical activity/exercise needs.;Develop an individualized exercise prescription for aerobic and resistive training based on initial evaluation findings, risk stratification, comorbidities and participant's personal goals.       Expected Outcomes  Short Term: Attend rehab on a regular basis to increase amount of physical activity.;Long Term: Add in home exercise to make exercise part of routine and to increase amount of physical activity.;Long Term: Exercising regularly at least 3-5 days a week.       Increase Strength and Stamina  Yes       Intervention  Provide advice, education, support and counseling about physical activity/exercise needs.;Develop an  individualized exercise prescription for aerobic and resistive training based on initial evaluation findings, risk stratification, comorbidities and participant's personal goals.       Expected Outcomes  Short Term: Increase workloads from initial exercise prescription for resistance, speed, and METs.;Short Term: Perform resistance training exercises routinely during rehab and add in resistance training at home;Long Term: Improve cardiorespiratory fitness, muscular endurance and strength as measured by increased METs and functional capacity (6MWT)       Able to understand and use rate of perceived exertion (RPE) scale  Yes       Intervention  Provide education and explanation on how to use RPE scale       Expected Outcomes  Short Term: Able to use RPE daily in rehab to express subjective intensity level;Long Term:  Able to use RPE to guide intensity level when exercising independently       Able to understand and use Dyspnea scale  Yes  Intervention  Provide education and explanation on how to use Dyspnea scale       Expected Outcomes  Short Term: Able to use Dyspnea scale daily in rehab to express subjective sense of shortness of breath during exertion;Long Term: Able to use Dyspnea scale to guide intensity level when exercising independently       Knowledge and understanding of Target Heart Rate Range (THRR)  Yes       Intervention  Provide education and explanation of THRR including how the numbers were predicted and where they are located for reference       Expected Outcomes  Short Term: Able to state/look up THRR;Short Term: Able to use daily as guideline for intensity in rehab;Long Term: Able to use THRR to govern intensity when exercising independently       Able to check pulse independently  Yes       Intervention  Provide education and demonstration on how to check pulse in carotid and radial arteries.;Review the importance of being able to check your own pulse for safety during  independent exercise       Expected Outcomes  Short Term: Able to explain why pulse checking is important during independent exercise;Long Term: Able to check pulse independently and accurately       Understanding of Exercise Prescription  Yes       Intervention  Provide education, explanation, and written materials on patient's individual exercise prescription       Expected Outcomes  Short Term: Able to explain program exercise prescription;Long Term: Able to explain home exercise prescription to exercise independently          Copy of goals given to participant.

## 2018-04-03 NOTE — Progress Notes (Signed)
Pulmonary Individual Treatment Plan  Patient Details  Name: Stacie [name] MRN: [id number] Date of Birth: [birth year] Referring Provider:     Pulmonary Rehab from 04/03/2018 in [facility] Cardiac and Pulmonary Rehab  Referring Provider  Rackley      Initial Encounter Date:    Pulmonary Rehab from 04/03/2018 in [facility] Cardiac and Pulmonary Rehab  Date  04/03/18  Referring Provider  Rackley      Visit Diagnosis: Pulmonary sarcoidosis [facility])  Patient's Home Medications on Admission:  Current Outpatient Medications:  .  Acetaminophen-Codeine (TYLENOL/CODEINE #3) 300-30 MG tablet, Take 1 tablet by mouth every 4 (four) hours as needed for pain., Disp: 42 tablet, Rfl: 0 .  albuterol (VENTOLIN HFA) 108 (90 Base) MCG/ACT inhaler, Inhale 2 puffs into the lungs every 6 (six) hours as needed for wheezing., Disp: 1 Inhaler, Rfl: 2 .  amLODipine (NORVASC) 10 MG tablet, Take 10 mg by mouth daily., Disp: , Rfl: 11 .  aspirin 81 MG tablet, Take 81 mg by mouth daily., Disp: , Rfl:  .  budesonide-formoterol (SYMBICORT) 80-4.5 MCG/ACT inhaler, Take 2 puffs first thing in am and then another 2 puffs about 12 hours later., Disp: 1 Inhaler, Rfl: 11 .  cetirizine (ZYRTEC) 10 MG tablet, Take 10 mg by mouth daily as needed. , Disp: , Rfl:  .  FORTEO 600 MCG/2.4ML SOLN, , Disp: , Rfl:  .  GLUCOSAMINE PO, Take 1 tablet by mouth daily., Disp: , Rfl:  .  HYDROcodone-acetaminophen (NORCO/VICODIN) 5-325 MG tablet, Take 1 tablet by mouth every 4 (four) hours as needed., Disp: , Rfl: 0 .  mometasone (NASONEX) 50 MCG/ACT nasal spray, Place 1 spray into the nose 2 (two) times daily., Disp: , Rfl:  .  Multiple Vitamin (MULTIVITAMIN) capsule, Take 1 capsule by mouth 2 (two) times daily. , Disp: , Rfl:  .  omeprazole (PRILOSEC) 40 MG capsule, Take 40 mg by mouth daily., Disp: , Rfl:  .  predniSONE (DELTASONE) 10 MG tablet, Take by mouth., Disp: , Rfl:  .  ranitidine (ZANTAC) 150 MG tablet, Take 150 mg by mouth at bedtime.  Reported on 03/30/2016, Disp: , Rfl:  .  Respiratory Therapy Supplies (FLUTTER) DEVI, Use as directed, Disp: 1 each, Rfl: 0 .  SYMBICORT 80-4.5 MCG/ACT inhaler, TAKE 2 PUFFS BY MOUTH TWICE A DAY, Disp: 10.2 Inhaler, Rfl: 0 .  SYNTHROID 137 MCG tablet, TAKE 1 TABLET (137 MCG TOTAL) BY MOUTH DAILY BEFORE BREAKFAST., Disp: 90 tablet, Rfl: 1  Past Medical History: Past Medical History:  Diagnosis Date  . Arrhythmia    patient unaware if this is current  . Asthma   . HOH (hard of hearing)    wear aids  . Hypothyroidism   . IBS (irritable bowel syndrome)   . Sarcoid   . Sarcoidosis   . Seasonal allergies   . Sleep apnea CPAP with O2    Tobacco Use: Social History   Tobacco Use  Smoking Status Never Smoker  Smokeless Tobacco Never Used    Labs: Recent Review Scientist, physiological    Labs for ITP Cardiac and Pulmonary Rehab Latest Ref Rng & Units 04/20/2017   Cholestrol 100 - 199 mg/dL 230(H)   LDLCALC 0 - 99 mg/dL 127(H)   HDL >39 mg/dL 54   Trlycerides 0 - 149 mg/dL 246(H)   Hemoglobin A1c 4.8 - 5.6 % 6.0(H)       Pulmonary Assessment Scores: Pulmonary Assessment Scores    Row Name 04/03/18 1108  ADL UCSD   ADL Phase  Entry     SOB Score total  80     Rest  2     Walk  3     Stairs  5     Bath  4     Dress  3     Shop  4       CAT Score   CAT Score  22       mMRC Score   mMRC Score  3        Pulmonary Function Assessment: Pulmonary Function Assessment - 04/03/18 1112      Breath   Bilateral Breath Sounds  Wheezes;Decreased slight wheezes in LLL    Shortness of Breath  Yes;Limiting activity       Exercise Target Goals: Date: 04/03/18  Exercise Program Goal: Individual exercise prescription set using results from initial 6 min walk test and THRR while considering  patient's activity barriers and safety.    Exercise Prescription Goal: Initial exercise prescription builds to 30-45 minutes a day of aerobic activity, 2-3 days per week.  Home exercise  guidelines will be given to patient during program as part of exercise prescription that the participant will acknowledge.  Activity Barriers & Risk Stratification:   6 Minute Walk: 6 Minute Walk    Row Name 04/03/18 1301         6 Minute Walk   Distance  786 feet     Walk Time  4.9 minutes     # of Rest Breaks  1     MPH  1.82     METS  2.96     RPE  18     Perceived Dyspnea   3     VO2 Peak  10.4     Symptoms  No     Resting HR  121 bpm     Resting BP  128/66     Resting Oxygen Saturation   92 %     Exercise Oxygen Saturation  during 6 min walk  82 %     Max Ex. HR  133 bpm     Max Ex. BP  176/72     2 Minute Post BP  140/76       Interval HR   1 Minute HR  124     2 Minute HR  124     3 Minute HR  126     4 Minute HR  129     5 Minute HR  133     6 Minute HR  130     2 Minute Post HR  123     Interval Heart Rate?  Yes       Interval Oxygen   Interval Oxygen?  Yes     Baseline Oxygen Saturation %  92 %     1 Minute Oxygen Saturation %  93 %     1 Minute Liters of Oxygen  0 L     2 Minute Oxygen Saturation %  89 %     2 Minute Liters of Oxygen  0 L     3 Minute Oxygen Saturation %  85 %     3 Minute Liters of Oxygen  0 L     4 Minute Oxygen Saturation %  82 %     4 Minute Liters of Oxygen  0 L     5 Minute Oxygen Saturation %  86 %  5 Minute Liters of Oxygen  0 L     6 Minute Oxygen Saturation %  86 %     6 Minute Liters of Oxygen  0 L     2 Minute Post Oxygen Saturation %  93 %       Oxygen Initial Assessment: Oxygen Initial Assessment - 04/03/18 1115      Home Oxygen   Home Oxygen Device  Portable Concentrator    Sleep Oxygen Prescription  CPAP    Liters per minute  2    Home Exercise Oxygen Prescription  None    Home at Rest Exercise Oxygen Prescription  None    Compliance with Home Oxygen Use  Yes      Initial 6 min Walk   Oxygen Used  None      Program Oxygen Prescription   Program Oxygen Prescription  None      Intervention   Short  Term Goals  To learn and exhibit compliance with exercise, home and travel O2 prescription;To learn and understand importance of maintaining oxygen saturations>88%;To learn and demonstrate proper use of respiratory medications;To learn and demonstrate proper pursed lip breathing techniques or other breathing techniques.;To learn and understand importance of monitoring SPO2 with pulse oximeter and demonstrate accurate use of the pulse oximeter.    Long  Term Goals  Exhibits compliance with exercise, home and travel O2 prescription;Verbalizes importance of monitoring SPO2 with pulse oximeter and return demonstration;Maintenance of O2 saturations>88%;Exhibits proper breathing techniques, such as pursed lip breathing or other method taught during program session;Compliance with respiratory medication;Demonstrates proper use of MDI's       Oxygen Re-Evaluation:   Oxygen Discharge (Final Oxygen Re-Evaluation):   Initial Exercise Prescription: Initial Exercise Prescription - 04/03/18 1300      Date of Initial Exercise RX and Referring Provider   Date  04/03/18    Referring Provider  Rackley      Treadmill   MPH  1.5    Grade  0    Minutes  15    METs  2.15      NuStep   Level  2    SPM  80    Minutes  15    METs  2.1      Biostep-RELP   Level  2    SPM  50    Minutes  15    METs  2      Prescription Details   Frequency (times per week)  3    Duration  Progress to 45 minutes of aerobic exercise without signs/symptoms of physical distress      Intensity   THRR 40-80% of Max Heartrate  135-150    Ratings of Perceived Exertion  11-15    Perceived Dyspnea  0-4      Resistance Training   Training Prescription  Yes    Weight  3 lb    Reps  10-15       Perform Capillary Blood Glucose checks as needed.  Exercise Prescription Changes:   Exercise Comments:   Exercise Goals and Review: Exercise Goals    Row Name 04/03/18 1300             Exercise Goals   Increase  Physical Activity  Yes       Intervention  Provide advice, education, support and counseling about physical activity/exercise needs.;Develop an individualized exercise prescription for aerobic and resistive training based on initial evaluation findings, risk stratification, comorbidities and participant's personal goals.  Expected Outcomes  Short Term: Attend rehab on a regular basis to increase amount of physical activity.;Long Term: Add in home exercise to make exercise part of routine and to increase amount of physical activity.;Long Term: Exercising regularly at least 3-5 days a week.       Increase Strength and Stamina  Yes       Intervention  Provide advice, education, support and counseling about physical activity/exercise needs.;Develop an individualized exercise prescription for aerobic and resistive training based on initial evaluation findings, risk stratification, comorbidities and participant's personal goals.       Expected Outcomes  Short Term: Increase workloads from initial exercise prescription for resistance, speed, and METs.;Short Term: Perform resistance training exercises routinely during rehab and add in resistance training at home;Long Term: Improve cardiorespiratory fitness, muscular endurance and strength as measured by increased METs and functional capacity (6MWT)       Able to understand and use rate of perceived exertion (RPE) scale  Yes       Intervention  Provide education and explanation on how to use RPE scale       Expected Outcomes  Short Term: Able to use RPE daily in rehab to express subjective intensity level;Long Term:  Able to use RPE to guide intensity level when exercising independently       Able to understand and use Dyspnea scale  Yes       Intervention  Provide education and explanation on how to use Dyspnea scale       Expected Outcomes  Short Term: Able to use Dyspnea scale daily in rehab to express subjective sense of shortness of breath during  exertion;Long Term: Able to use Dyspnea scale to guide intensity level when exercising independently       Knowledge and understanding of Target Heart Rate Range (THRR)  Yes       Intervention  Provide education and explanation of THRR including how the numbers were predicted and where they are located for reference       Expected Outcomes  Short Term: Able to state/look up THRR;Short Term: Able to use daily as guideline for intensity in rehab;Long Term: Able to use THRR to govern intensity when exercising independently       Able to check pulse independently  Yes       Intervention  Provide education and demonstration on how to check pulse in carotid and radial arteries.;Review the importance of being able to check your own pulse for safety during independent exercise       Expected Outcomes  Short Term: Able to explain why pulse checking is important during independent exercise;Long Term: Able to check pulse independently and accurately       Understanding of Exercise Prescription  Yes       Intervention  Provide education, explanation, and written materials on patient's individual exercise prescription       Expected Outcomes  Short Term: Able to explain program exercise prescription;Long Term: Able to explain home exercise prescription to exercise independently          Exercise Goals Re-Evaluation :   Discharge Exercise Prescription (Final Exercise Prescription Changes):   Nutrition:  Target Goals: Understanding of nutrition guidelines, daily intake of sodium <156m, cholesterol <2044m calories 30% from fat and 7% or less from saturated fats, daily to have 5 or more servings of fruits and vegetables.  Biometrics: Pre Biometrics - 04/03/18 1259      Pre Biometrics   Height  5' 6.5" (1.689  m)    Weight  198 lb 11.2 oz (90.1 kg)    Waist Circumference  39.5 inches    Hip Circumference  47.75 inches    Waist to Hip Ratio  0.83 %    BMI (Calculated)  31.59        Nutrition  Therapy Plan and Nutrition Goals: Nutrition Therapy & Goals - 04/03/18 1107      Personal Nutrition Goals   Comments  Weight loss, she wants to make a lifestyle changes that she can continue for the rest of her life. She would like to meet with the dietician.      Intervention Plan   Intervention  Prescribe, educate and counsel regarding individualized specific dietary modifications aiming towards targeted core components such as weight, hypertension, lipid management, diabetes, heart failure and other comorbidities.;Nutrition handout(s) given to patient.    Expected Outcomes  Short Term Goal: Understand basic principles of dietary content, such as calories, fat, sodium, cholesterol and nutrients.;Long Term Goal: Adherence to prescribed nutrition plan.       Nutrition Assessments: Nutrition Assessments - 04/03/18 1107      MEDFICTS Scores   Pre Score  13       Nutrition Goals Re-Evaluation:   Nutrition Goals Discharge (Final Nutrition Goals Re-Evaluation):   Psychosocial: Target Goals: Acknowledge presence or absence of significant depression and/or stress, maximize coping skills, provide positive support system. Participant is able to verbalize types and ability to use techniques and skills needed for reducing stress and depression.   Initial Review & Psychosocial Screening: Initial Psych Review & Screening - 04/03/18 1105      Initial Review   Current issues with  Current Stress Concerns    Source of Stress Concerns  Chronic Illness    Comments  Her health is the only thing that is stressing her out.      Family Dynamics   Good Support System?  Yes    Comments  She can look to her oldest daughter, husband and her other daughters.      Barriers   Psychosocial barriers to participate in program  The patient should benefit from training in stress management and relaxation.      Screening Interventions   Interventions  Encouraged to exercise;Program counselor consult;To  provide support and resources with identified psychosocial needs;Provide feedback about the scores to participant    Expected Outcomes  Short Term goal: Utilizing psychosocial counselor, staff and physician to assist with identification of specific Stressors or current issues interfering with healing process. Setting desired goal for each stressor or current issue identified.;Long Term Goal: Stressors or current issues are controlled or eliminated.;Short Term goal: Identification and review with participant of any Quality of Life or Depression concerns found by scoring the questionnaire.;Long Term goal: The participant improves quality of Life and PHQ9 Scores as seen by post scores and/or verbalization of changes       Quality of Life Scores:  Scores of 19 and below usually indicate a poorer quality of life in these areas.  A difference of  2-3 points is a clinically meaningful difference.  A difference of 2-3 points in the total score of the Quality of Life Index has been associated with significant improvement in overall quality of life, self-image, physical symptoms, and general health in studies assessing change in quality of life.  PHQ-9: Recent Review Flowsheet Data    Depression screen [facility] 2/9 04/03/2018 04/02/2018 04/20/2017   Decreased Interest 3 1 0   Down, Depressed,  Hopeless 1 1 0   PHQ - 2 Score 4 2 0   Altered sleeping 0 0 -   Tired, decreased energy 3 1 -   Change in appetite 1 0 -   Feeling bad or failure about yourself  2 0 -   Trouble concentrating 3 1 -   Moving slowly or fidgety/restless 0 0 -   Suicidal thoughts 0 0 -   PHQ-9 Score 13 4 -   Difficult doing work/chores Very difficult Not difficult at all -     Interpretation of Total Score  Total Score Depression Severity:  1-4 = Minimal depression, 5-9 = Mild depression, 10-14 = Moderate depression, 15-19 = Moderately severe depression, 20-27 = Severe depression   Psychosocial Evaluation and  Intervention:   Psychosocial Re-Evaluation:   Psychosocial Discharge (Final Psychosocial Re-Evaluation):   Education: Education Goals: Education classes will be provided on a weekly basis, covering required topics. Participant will state understanding/return demonstration of topics presented.  Learning Barriers/Preferences: Learning Barriers/Preferences - 04/03/18 1112      Learning Barriers/Preferences   Learning Barriers  Sight reading glasses and prescription    Learning Preferences  Audio;None       Education Topics:  Initial Evaluation Education: - Verbal, written and demonstration of respiratory meds, oximetry and breathing techniques. Instruction on use of nebulizers and MDIs and importance of monitoring MDI activations.   Pulmonary Rehab from 04/03/2018 in [facility] Cardiac and Pulmonary Rehab  Date  04/03/18  Educator  [facility]  Instruction Review Code  1- Verbalizes Understanding      General Nutrition Guidelines/Fats and Fiber: -Group instruction provided by verbal, written material, models and posters to present the general guidelines for heart healthy nutrition. Gives an explanation and review of dietary fats and fiber.   Controlling Sodium/Reading Food Labels: -Group verbal and written material supporting the discussion of sodium use in heart healthy nutrition. Review and explanation with models, verbal and written materials for utilization of the food label.   Exercise Physiology & General Exercise Guidelines: - Group verbal and written instruction with models to review the exercise physiology of the cardiovascular system and associated critical values. Provides general exercise guidelines with specific guidelines to those with heart or lung disease.    Aerobic Exercise & Resistance Training: - Gives group verbal and written instruction on the various components of exercise. Focuses on aerobic and resistive training programs and the benefits of this training and how to  safely progress through these programs.   Flexibility, Balance, Mind/Body Relaxation: Provides group verbal/written instruction on the benefits of flexibility and balance training, including mind/body exercise modes such as yoga, pilates and tai chi.  Demonstration and skill practice provided.   Stress and Anxiety: - Provides group verbal and written instruction about the health risks of elevated stress and causes of high stress.  Discuss the correlation between heart/lung disease and anxiety and treatment options. Review healthy ways to manage with stress and anxiety.   Depression: - Provides group verbal and written instruction on the correlation between heart/lung disease and depressed mood, treatment options, and the stigmas associated with seeking treatment.   Exercise & Equipment Safety: - Individual verbal instruction and demonstration of equipment use and safety with use of the equipment.   Pulmonary Rehab from 04/03/2018 in [facility] Cardiac and Pulmonary Rehab  Date  04/03/18  Educator  [facility]  Instruction Review Code  1- Verbalizes Understanding      Infection Prevention: - Provides verbal and written material to individual with  discussion of infection control including proper hand washing and proper equipment cleaning during exercise session.   Pulmonary Rehab from 04/03/2018 in [facility] Cardiac and Pulmonary Rehab  Date  04/03/18  Educator  [facility]  Instruction Review Code  1- Verbalizes Understanding      Falls Prevention: - Provides verbal and written material to individual with discussion of falls prevention and safety.   Pulmonary Rehab from 04/03/2018 in [facility] Cardiac and Pulmonary Rehab  Date  04/03/18  Educator  [facility]  Instruction Review Code  1- Verbalizes Understanding      Diabetes: - Individual verbal and written instruction to review signs/symptoms of diabetes, desired ranges of glucose level fasting, after meals and with exercise. Advice that pre and post exercise glucose  checks will be done for 3 sessions at entry of program.   Chronic Lung Diseases: - Group verbal and written instruction to review updates, respiratory medications, advancements in procedures and treatments. Discuss use of supplemental oxygen including available portable oxygen systems, continuous and intermittent flow rates, concentrators, personal use and safety guidelines. Review proper use of inhaler and spacers. Provide informative websites for self-education.    Energy Conservation: - Provide group verbal and written instruction for methods to conserve energy, plan and organize activities. Instruct on pacing techniques, use of adaptive equipment and posture/positioning to relieve shortness of breath.   Triggers and Exacerbations: - Group verbal and written instruction to review types of environmental triggers and ways to prevent exacerbations. Discuss weather changes, air quality and the benefits of nasal washing. Review warning signs and symptoms to help prevent infections. Discuss techniques for effective airway clearance, coughing, and vibrations.   AED/CPR: - Group verbal and written instruction with the use of models to demonstrate the basic use of the AED with the basic ABC's of resuscitation.   Anatomy and Physiology of the Lungs: - Group verbal and written instruction with the use of models to provide basic lung anatomy and physiology related to function, structure and complications of lung disease.   Anatomy & Physiology of the Heart: - Group verbal and written instruction and models provide basic cardiac anatomy and physiology, with the coronary electrical and arterial systems. Review of Valvular disease and Heart Failure   Cardiac Medications: - Group verbal and written instruction to review commonly prescribed medications for heart disease. Reviews the medication, class of the drug, and side effects.   Know Your Numbers and Risk Factors: -Group verbal and written  instruction about important numbers in your health.  Discussion of what are risk factors and how they play a role in the disease process.  Review of Cholesterol, Blood Pressure, Diabetes, and BMI and the role they play in your overall health.   Sleep Hygiene: -Provides group verbal and written instruction about how sleep can affect your health.  Define sleep hygiene, discuss sleep cycles and impact of sleep habits. Review good sleep hygiene tips.    Other: -Provides group and verbal instruction on various topics (see comments)    Knowledge Questionnaire Score: Knowledge Questionnaire Score - 04/03/18 1112      Knowledge Questionnaire Score   Pre Score  16/18 reviewed        Core Components/Risk Factors/Patient Goals at Admission: Personal Goals and Risk Factors at Admission - 04/03/18 1118      Core Components/Risk Factors/Patient Goals on Admission    Weight Management  Yes;Weight Loss;Weight Maintenance    Intervention  Weight Management: Develop a combined nutrition and exercise program designed to reach desired caloric  intake, while maintaining appropriate intake of nutrient and fiber, sodium and fats, and appropriate energy expenditure required for the weight goal.;Weight Management: Provide education and appropriate resources to help participant work on and attain dietary goals.;Weight Management/Obesity: Establish reasonable short term and long term weight goals.    Admit Weight  198 lb 11.2 oz (90.1 kg)    Goal Weight: Short Term  193 lb (87.5 kg)    Goal Weight: Long Term  150 lb (68 kg)    Expected Outcomes  Weight Maintenance: Understanding of the daily nutrition guidelines, which includes 25-35% calories from fat, 7% or less cal from saturated fats, less than 238m cholesterol, less than 1.5gm of sodium, & 5 or more servings of fruits and vegetables daily;Short Term: Continue to assess and modify interventions until short term weight is achieved;Long Term: Adherence to  nutrition and physical activity/exercise program aimed toward attainment of established weight goal;Weight Loss: Understanding of general recommendations for a balanced deficit meal plan, which promotes 1-2 lb weight loss per week and includes a negative energy balance of [phone] kcal/d;Understanding recommendations for meals to include 15-35% energy as protein, 25-35% energy from fat, 35-60% energy from carbohydrates, less than 2030mof dietary cholesterol, 20-35 gm of total fiber daily;Understanding of distribution of calorie intake throughout the day with the consumption of 4-5 meals/snacks    Improve shortness of breath with ADL's  Yes    Intervention  Provide education, individualized exercise plan and daily activity instruction to help decrease symptoms of SOB with activities of daily living.    Expected Outcomes  Short Term: Improve cardiorespiratory fitness to achieve a reduction of symptoms when performing ADLs;Long Term: Be able to perform more ADLs without symptoms or delay the onset of symptoms    Hypertension  Yes    Intervention  Provide education on lifestyle modifcations including regular physical activity/exercise, weight management, moderate sodium restriction and increased consumption of fresh fruit, vegetables, and low fat dairy, alcohol moderation, and smoking cessation.;Monitor prescription use compliance.    Expected Outcomes  Short Term: Continued assessment and intervention until BP is < 140/9025mG in hypertensive participants. < 130/41m38m in hypertensive participants with diabetes, heart failure or chronic kidney disease.;Long Term: Maintenance of blood pressure at goal levels.       Core Components/Risk Factors/Patient Goals Review:    Core Components/Risk Factors/Patient Goals at Discharge (Final Review):    ITP Comments: ITP Comments    Row Name 04/03/18 1038           ITP Comments  Medical Evaluation completed. Chart sent for review and changes to Dr. KleiCaryl Comesr Dr. MarkEmily Filbertector of [facility] can be found in [facility] encounter 03/22/18          Comments: Initial ITP

## 2018-04-09 DIAGNOSIS — D860 Sarcoidosis of lung: Secondary | ICD-10-CM

## 2018-04-09 NOTE — Progress Notes (Signed)
Daily Session Note  Patient Details  Name: Stacie [name] MRN: [id number] Date of Birth: [birth year] Referring Provider:     Pulmonary Rehab from 04/03/2018 in [facility]  Referring Provider  [place]      Encounter Date: 04/09/2018  Check In: Session Check In - 04/09/18 1022      Check-In   Location  [facility]-Cardiac & Pulmonary Rehab    Staff Present  Justin Mend Jaci Carrel, BS, ACSM CEP, Exercise Physiologist;Amanda Oletta Darter, [place], ACSM CEP, Exercise Physiologist    Supervising physician immediately available to respond to emergencies  [name] immediately available ER MD    Physician(s)  Dr. Jimmye Norman and Cinda Quest    Medication changes reported      No    Fall or balance concerns reported     No    Tobacco Cessation  No Change    Warm-up and Cool-down  Performed as group-led instruction    Resistance Training Performed  Yes    VAD Patient?  No      Pain Assessment   Currently in Pain?  No/denies          Social History   Tobacco Use  Smoking Status Never Smoker  Smokeless Tobacco Never Used    Goals Met:  Exercise tolerated well Personal goals reviewed Queuing for purse lip breathing No report of cardiac concerns or symptoms Strength training completed today  Goals Unmet:  Not Applicable  Comments: First full day of exercise!  Patient was oriented to gym and equipment including functions, settings, policies, and procedures.  Patient's individual exercise prescription and treatment plan were reviewed.  All starting workloads were established based on the results of the 6 minute walk test done at initial orientation visit.  The plan for exercise progression was also introduced and progression will be customized based on patient's performance and goals.   Dr. Emily Filbert is Medical Director for [place] and [name].

## 2018-04-11 DIAGNOSIS — D860 Sarcoidosis of lung: Secondary | ICD-10-CM | POA: Diagnosis not present

## 2018-04-11 NOTE — Progress Notes (Signed)
Daily Session Note  Patient Details  Name: Stacie [name] MRN: [id number] Date of Birth: 02-Nov-1955 Referring Provider:     Pulmonary Rehab from 04/03/2018 in [facility] Re[facility]  Referring Provider  [place]      Encounter Date: 04/11/2018  Check In: Session Check In - 04/11/18 1019      Check-In   Location  [facility]    Staff Present  Justin Mend Lorre Nick, MA, RCEP, CCRP, Exercise Physiologist;Amanda Oletta Darter, [place], ACSM CEP, Exercise Physiologist    Supervising physician immediately available to respond to emergencies  [name] immediately available ER MD    Physician(s)  Dr. Jimmye Norman and Quentin Cornwall    Medication changes reported      No    Fall or balance concerns reported     No    Tobacco Cessation  No Change    Warm-up and Cool-down  Performed as group-led instruction    Resistance Training Performed  Yes    VAD Patient?  No    PAD/SET Patient?  No      Pain Assessment   Currently in Pain?  No/denies          Social History   Tobacco Use  Smoking Status Never Smoker  Smokeless Tobacco Never Used    Goals Met:  Independence with exercise equipment Exercise tolerated well No report of cardiac concerns or symptoms Strength training completed today  Goals Unmet:  Not Applicable  Comments: Pt able to follow exercise prescription today without complaint.  Will continue to monitor for progression.   Dr. Emily Filbert is Medical Director for [place] and [name].

## 2018-04-16 ENCOUNTER — Encounter: Payer: BLUE CROSS/BLUE SHIELD | Attending: Internal Medicine

## 2018-04-16 DIAGNOSIS — D860 Sarcoidosis of lung: Secondary | ICD-10-CM | POA: Insufficient documentation

## 2018-04-16 DIAGNOSIS — R0602 Shortness of breath: Secondary | ICD-10-CM | POA: Insufficient documentation

## 2018-04-16 NOTE — Progress Notes (Signed)
Pulmonary Individual Treatment Plan  Patient Details  Name: Stacie [name] MRN: [id number] Date of Birth: 1956/04/12 Referring Provider:     Pulmonary Rehab from 04/03/2018 in [facility] Cardiac and Pulmonary Rehab  Referring Provider  [place]      Initial Encounter Date:    Pulmonary Rehab from 04/03/2018 in [facility] Cardiac and Pulmonary Rehab  Date  04/03/18      Visit Diagnosis: Pulmonary sarcoidosis ([place])  Patient's Home Medications on Admission:  Current Outpatient Medications:  .  Acetaminophen-Codeine (TYLENOL/CODEINE #3) 300-30 MG tablet, Take 1 tablet by mouth every 4 (four) hours as needed for pain., Disp: 42 tablet, Rfl: 0 .  albuterol (VENTOLIN HFA) 108 (90 Base) MCG/ACT inhaler, Inhale 2 puffs into the lungs every 6 (six) hours as needed for wheezing., Disp: 1 Inhaler, Rfl: 2 .  amLODipine (NORVASC) 10 MG tablet, Take 10 mg by mouth daily., Disp: , Rfl: 11 .  aspirin 81 MG tablet, Take 81 mg by mouth daily., Disp: , Rfl:  .  budesonide-formoterol (SYMBICORT) 80-4.5 MCG/ACT inhaler, Take 2 puffs first thing in am and then another 2 puffs about 12 hours later., Disp: 1 Inhaler, Rfl: 11 .  cetirizine (ZYRTEC) 10 MG tablet, Take 10 mg by mouth daily as needed. , Disp: , Rfl:  .  FORTEO 600 MCG/2.4ML SOLN, , Disp: , Rfl:  .  GLUCOSAMINE PO, Take 1 tablet by mouth daily., Disp: , Rfl:  .  HYDROcodone-acetaminophen (NORCO/VICODIN) 5-325 MG tablet, Take 1 tablet by mouth every 4 (four) hours as needed., Disp: , Rfl: 0 .  mometasone (NASONEX) 50 MCG/ACT nasal spray, Place 1 spray into the nose 2 (two) times daily., Disp: , Rfl:  .  Multiple Vitamin (MULTIVITAMIN) capsule, Take 1 capsule by mouth 2 (two) times daily. , Disp: , Rfl:  .  omeprazole (PRILOSEC) 40 MG capsule, Take 40 mg by mouth daily., Disp: , Rfl:  .  predniSONE (DELTASONE) 10 MG tablet, Take by mouth., Disp: , Rfl:  .  ranitidine (ZANTAC) 150 MG tablet, Take 150 mg by mouth at bedtime. Reported on 03/30/2016, Disp: , Rfl:   .  Respiratory Therapy Supplies (FLUTTER) DEVI, Use as directed, Disp: 1 each, Rfl: 0 .  SYMBICORT 80-4.5 MCG/ACT inhaler, TAKE 2 PUFFS BY MOUTH TWICE A DAY, Disp: 10.2 Inhaler, Rfl: 0 .  SYNTHROID 137 MCG tablet, TAKE 1 TABLET (137 MCG TOTAL) BY MOUTH DAILY BEFORE BREAKFAST., Disp: 90 tablet, Rfl: 1  Past Medical History: Past Medical History:  Diagnosis Date  . Arrhythmia    patient unaware if this is current  . Asthma   . HOH (hard of hearing)    wear aids  . Hypothyroidism   . IBS (irritable bowel syndrome)   . Sarcoid   . Sarcoidosis   . Seasonal allergies   . Sleep apnea CPAP with O2    Tobacco Use: Social History   Tobacco Use  Smoking Status Never Smoker  Smokeless Tobacco Never Used    Labs: Recent Review Scientist, physiological    Labs for ITP Cardiac and Pulmonary Rehab Latest Ref Rng & Units 04/20/2017   Cholestrol 100 - 199 mg/dL 230(H)   LDLCALC 0 - 99 mg/dL 127(H)   HDL >39 mg/dL 54   Trlycerides 0 - 149 mg/dL 246(H)   Hemoglobin A1c 4.8 - 5.6 % 6.0(H)       Pulmonary Assessment Scores: Pulmonary Assessment Scores    Row Name 04/03/18 1108         ADL UCSD  ADL Phase  Entry     SOB Score total  80     Rest  2     Walk  3     Stairs  5     Bath  4     Dress  3     Shop  4       CAT Score   CAT Score  22       mMRC Score   mMRC Score  3        Pulmonary Function Assessment: Pulmonary Function Assessment - 04/03/18 1112      Breath   Bilateral Breath Sounds  Wheezes;Decreased slight wheezes in LLL    Shortness of Breath  Yes;Limiting activity       Exercise Target Goals:    Exercise Program Goal: Individual exercise prescription set using results from initial 6 min walk test and THRR while considering  patient's activity barriers and safety.    Exercise Prescription Goal: Initial exercise prescription builds to 30-45 minutes a day of aerobic activity, 2-3 days per week.  Home exercise guidelines will be given to patient during  program as part of exercise prescription that the participant will acknowledge.  Activity Barriers & Risk Stratification:   6 Minute Walk: 6 Minute Walk    Row Name 04/03/18 1301         6 Minute Walk   Distance  786 feet     Walk Time  4.9 minutes     # of Rest Breaks  1     MPH  1.82     METS  2.96     RPE  18     Perceived Dyspnea   3     VO2 Peak  10.4     Symptoms  No     Resting HR  121 bpm     Resting BP  128/66     Resting Oxygen Saturation   92 %     Exercise Oxygen Saturation  during 6 min walk  82 %     Max Ex. HR  133 bpm     Max Ex. BP  176/72     2 Minute Post BP  140/76       Interval HR   1 Minute HR  124     2 Minute HR  124     3 Minute HR  126     4 Minute HR  129     5 Minute HR  133     6 Minute HR  130     2 Minute Post HR  123     Interval Heart Rate?  Yes       Interval Oxygen   Interval Oxygen?  Yes     Baseline Oxygen Saturation %  92 %     1 Minute Oxygen Saturation %  93 %     1 Minute Liters of Oxygen  0 L     2 Minute Oxygen Saturation %  89 %     2 Minute Liters of Oxygen  0 L     3 Minute Oxygen Saturation %  85 %     3 Minute Liters of Oxygen  0 L     4 Minute Oxygen Saturation %  82 %     4 Minute Liters of Oxygen  0 L     5 Minute Oxygen Saturation %  86 %     5 Minute Liters of  Oxygen  0 L     6 Minute Oxygen Saturation %  86 %     6 Minute Liters of Oxygen  0 L     2 Minute Post Oxygen Saturation %  93 %       Oxygen Initial Assessment: Oxygen Initial Assessment - 04/03/18 1115      Home Oxygen   Home Oxygen Device  Portable Concentrator    Sleep Oxygen Prescription  CPAP    Liters per minute  2    Home Exercise Oxygen Prescription  None    Home at Rest Exercise Oxygen Prescription  None    Compliance with Home Oxygen Use  Yes      Initial 6 min Walk   Oxygen Used  None      Program Oxygen Prescription   Program Oxygen Prescription  None      Intervention   Short Term Goals  To learn and exhibit compliance  with exercise, home and travel O2 prescription;To learn and understand importance of maintaining oxygen saturations>88%;To learn and demonstrate proper use of respiratory medications;To learn and demonstrate proper pursed lip breathing techniques or other breathing techniques.;To learn and understand importance of monitoring SPO2 with pulse oximeter and demonstrate accurate use of the pulse oximeter.    Long  Term Goals  Exhibits compliance with exercise, home and travel O2 prescription;Verbalizes importance of monitoring SPO2 with pulse oximeter and return demonstration;Maintenance of O2 saturations>88%;Exhibits proper breathing techniques, such as pursed lip breathing or other method taught during program session;Compliance with respiratory medication;Demonstrates proper use of MDI's       Oxygen Re-Evaluation: Oxygen Re-Evaluation    Row Name 04/09/18 1027             Program Oxygen Prescription   Program Oxygen Prescription  None         Home Oxygen   Home Oxygen Device  Portable Concentrator       Sleep Oxygen Prescription  CPAP       Liters per minute  2       Home Exercise Oxygen Prescription  None       Home at Rest Exercise Oxygen Prescription  None       Compliance with Home Oxygen Use  Yes         Goals/Expected Outcomes   Short Term Goals  To learn and exhibit compliance with exercise, home and travel O2 prescription;To learn and understand importance of maintaining oxygen saturations>88%;To learn and demonstrate proper use of respiratory medications;To learn and demonstrate proper pursed lip breathing techniques or other breathing techniques.;To learn and understand importance of monitoring SPO2 with pulse oximeter and demonstrate accurate use of the pulse oximeter.       Long  Term Goals  Exhibits compliance with exercise, home and travel O2 prescription;Verbalizes importance of monitoring SPO2 with pulse oximeter and return demonstration;Maintenance of O2  saturations>88%;Exhibits proper breathing techniques, such as pursed lip breathing or other method taught during program session;Compliance with respiratory medication;Demonstrates proper use of MDI's       Comments  Reviewed PLB technique with pt.  Talked about how it work and it's important to maintaining his exercise saturations.         Goals/Expected Outcomes  Short: Become more profiecient at using PLB.   Long: Become independent at using PLB.          Oxygen Discharge (Final Oxygen Re-Evaluation): Oxygen Re-Evaluation - 04/09/18 1027      Program Oxygen Prescription  Program Oxygen Prescription  None      Home Oxygen   Home Oxygen Device  Portable Concentrator    Sleep Oxygen Prescription  CPAP    Liters per minute  2    Home Exercise Oxygen Prescription  None    Home at Rest Exercise Oxygen Prescription  None    Compliance with Home Oxygen Use  Yes      Goals/Expected Outcomes   Short Term Goals  To learn and exhibit compliance with exercise, home and travel O2 prescription;To learn and understand importance of maintaining oxygen saturations>88%;To learn and demonstrate proper use of respiratory medications;To learn and demonstrate proper pursed lip breathing techniques or other breathing techniques.;To learn and understand importance of monitoring SPO2 with pulse oximeter and demonstrate accurate use of the pulse oximeter.    Long  Term Goals  Exhibits compliance with exercise, home and travel O2 prescription;Verbalizes importance of monitoring SPO2 with pulse oximeter and return demonstration;Maintenance of O2 saturations>88%;Exhibits proper breathing techniques, such as pursed lip breathing or other method taught during program session;Compliance with respiratory medication;Demonstrates proper use of MDI's    Comments  Reviewed PLB technique with pt.  Talked about how it work and it's important to maintaining his exercise saturations.      Goals/Expected Outcomes  Short: Become  more profiecient at using PLB.   Long: Become independent at using PLB.       Initial Exercise Prescription: Initial Exercise Prescription - 04/03/18 1300      Date of Initial Exercise RX and Referring Provider   Date  04/03/18    Referring Provider  Rackley      Treadmill   MPH  1.5    Grade  0    Minutes  15    METs  2.15      NuStep   Level  2    SPM  80    Minutes  15    METs  2.1      Biostep-RELP   Level  2    SPM  50    Minutes  15    METs  2      Prescription Details   Frequency (times per week)  3    Duration  Progress to 45 minutes of aerobic exercise without signs/symptoms of physical distress      Intensity   THRR 40-80% of Max Heartrate  135-150    Ratings of Perceived Exertion  11-15    Perceived Dyspnea  0-4      Resistance Training   Training Prescription  Yes    Weight  3 lb    Reps  10-15       Perform Capillary Blood Glucose checks as needed.  Exercise Prescription Changes:   Exercise Comments: Exercise Comments    Row Name 04/09/18 1027           Exercise Comments  First full day of exercise!  Patient was oriented to gym and equipment including functions, settings, policies, and procedures.  Patient's individual exercise prescription and treatment plan were reviewed.  All starting workloads were established based on the results of the 6 minute walk test done at initial orientation visit.  The plan for exercise progression was also introduced and progression will be customized based on patient's performance and goals.          Exercise Goals and Review: Exercise Goals    Row Name 04/03/18 1300  Exercise Goals   Increase Physical Activity  Yes       Intervention  Provide advice, education, support and counseling about physical activity/exercise needs.;Develop an individualized exercise prescription for aerobic and resistive training based on initial evaluation findings, risk stratification, comorbidities and  participant's personal goals.       Expected Outcomes  Short Term: Attend rehab on a regular basis to increase amount of physical activity.;Long Term: Add in home exercise to make exercise part of routine and to increase amount of physical activity.;Long Term: Exercising regularly at least 3-5 days a week.       Increase Strength and Stamina  Yes       Intervention  Provide advice, education, support and counseling about physical activity/exercise needs.;Develop an individualized exercise prescription for aerobic and resistive training based on initial evaluation findings, risk stratification, comorbidities and participant's personal goals.       Expected Outcomes  Short Term: Increase workloads from initial exercise prescription for resistance, speed, and METs.;Short Term: Perform resistance training exercises routinely during rehab and add in resistance training at home;Long Term: Improve cardiorespiratory fitness, muscular endurance and strength as measured by increased METs and functional capacity (6MWT)       Able to understand and use rate of perceived exertion (RPE) scale  Yes       Intervention  Provide education and explanation on how to use RPE scale       Expected Outcomes  Short Term: Able to use RPE daily in rehab to express subjective intensity level;Long Term:  Able to use RPE to guide intensity level when exercising independently       Able to understand and use Dyspnea scale  Yes       Intervention  Provide education and explanation on how to use Dyspnea scale       Expected Outcomes  Short Term: Able to use Dyspnea scale daily in rehab to express subjective sense of shortness of breath during exertion;Long Term: Able to use Dyspnea scale to guide intensity level when exercising independently       Knowledge and understanding of Target Heart Rate Range (THRR)  Yes       Intervention  Provide education and explanation of THRR including how the numbers were predicted and where they are  located for reference       Expected Outcomes  Short Term: Able to state/look up THRR;Short Term: Able to use daily as guideline for intensity in rehab;Long Term: Able to use THRR to govern intensity when exercising independently       Able to check pulse independently  Yes       Intervention  Provide education and demonstration on how to check pulse in carotid and radial arteries.;Review the importance of being able to check your own pulse for safety during independent exercise       Expected Outcomes  Short Term: Able to explain why pulse checking is important during independent exercise;Long Term: Able to check pulse independently and accurately       Understanding of Exercise Prescription  Yes       Intervention  Provide education, explanation, and written materials on patient's individual exercise prescription       Expected Outcomes  Short Term: Able to explain program exercise prescription;Long Term: Able to explain home exercise prescription to exercise independently          Exercise Goals Re-Evaluation : Exercise Goals Re-Evaluation    [place] Name 04/09/18 1027  Exercise Goal Re-Evaluation   Exercise Goals Review  Understanding of Exercise Prescription;Able to understand and use Dyspnea scale;Knowledge and understanding of Target Heart Rate Range (THRR);Able to understand and use rate of perceived exertion (RPE) scale       Comments  Reviewed RPE scale, THR and program prescription with pt today.  Pt voiced understanding and was given a copy of goals to take home.        Expected Outcomes  Short: Use RPE daily to regulate intensity.  Long: Follow program prescription in THR.          Discharge Exercise Prescription (Final Exercise Prescription Changes):   Nutrition:  Target Goals: Understanding of nutrition guidelines, daily intake of sodium <1540m, cholesterol <2034m calories 30% from fat and 7% or less from saturated fats, daily to have 5 or more servings of fruits  and vegetables.  Biometrics: Pre Biometrics - 04/03/18 1259      Pre Biometrics   Height  5' 6.5" (1.689 m)    Weight  198 lb 11.2 oz (90.1 kg)    Waist Circumference  39.5 inches    Hip Circumference  47.75 inches    Waist to Hip Ratio  0.83 %    BMI (Calculated)  31.59        Nutrition Therapy Plan and Nutrition Goals: Nutrition Therapy & Goals - 04/03/18 1107      Personal Nutrition Goals   Comments  Weight loss, she wants to make a lifestyle changes that she can continue for the rest of her life. She would like to meet with the dietician.      Intervention Plan   Intervention  Prescribe, educate and counsel regarding individualized specific dietary modifications aiming towards targeted core components such as weight, hypertension, lipid management, diabetes, heart failure and other comorbidities.;Nutrition handout(s) given to patient.    Expected Outcomes  Short Term Goal: Understand basic principles of dietary content, such as calories, fat, sodium, cholesterol and nutrients.;Long Term Goal: Adherence to prescribed nutrition plan.       Nutrition Assessments: Nutrition Assessments - 04/03/18 1107      MEDFICTS Scores   Pre Score  13       Nutrition Goals Re-Evaluation:   Nutrition Goals Discharge (Final Nutrition Goals Re-Evaluation):   Psychosocial: Target Goals: Acknowledge presence or absence of significant depression and/or stress, maximize coping skills, provide positive support system. Participant is able to verbalize types and ability to use techniques and skills needed for reducing stress and depression.   Initial Review & Psychosocial Screening: Initial Psych Review & Screening - 04/03/18 1105      Initial Review   Current issues with  Current Stress Concerns    Source of Stress Concerns  Chronic Illness    Comments  Her health is the only thing that is stressing her out.      Family Dynamics   Good Support System?  Yes    Comments  She can look to  her oldest daughter, husband and her other daughters.      Barriers   Psychosocial barriers to participate in program  The patient should benefit from training in stress management and relaxation.      Screening Interventions   Interventions  Encouraged to exercise;Program counselor consult;To provide support and resources with identified psychosocial needs;Provide feedback about the scores to participant    Expected Outcomes  Short Term goal: Utilizing psychosocial counselor, staff and physician to assist with identification of specific Stressors or current issues interfering  with healing process. Setting desired goal for each stressor or current issue identified.;Long Term Goal: Stressors or current issues are controlled or eliminated.;Short Term goal: Identification and review with participant of any Quality of Life or Depression concerns found by scoring the questionnaire.;Long Term goal: The participant improves quality of Life and PHQ9 Scores as seen by post scores and/or verbalization of changes       Quality of Life Scores:  Scores of 19 and below usually indicate a poorer quality of life in these areas.  A difference of  2-3 points is a clinically meaningful difference.  A difference of 2-3 points in the total score of the Quality of Life Index has been associated with significant improvement in overall quality of life, self-image, physical symptoms, and general health in studies assessing change in quality of life.  PHQ-9: Recent Review Flowsheet Data    Depression screen [facility] 2/9 04/03/2018 04/02/2018 04/20/2017   Decreased Interest 3 1 0   Down, Depressed, Hopeless 1 1 0   PHQ - 2 Score 4 2 0   Altered sleeping 0 0 -   Tired, decreased energy 3 1 -   Change in appetite 1 0 -   Feeling bad or failure about yourself  2 0 -   Trouble concentrating 3 1 -   Moving slowly or fidgety/restless 0 0 -   Suicidal thoughts 0 0 -   PHQ-9 Score 13 4 -   Difficult doing work/chores Very  difficult Not difficult at all -     Interpretation of Total Score  Total Score Depression Severity:  1-4 = Minimal depression, 5-9 = Mild depression, 10-14 = Moderate depression, 15-19 = Moderately severe depression, 20-27 = Severe depression   Psychosocial Evaluation and Intervention: Psychosocial Evaluation - 04/11/18 1206      Psychosocial Evaluation & Interventions   Interventions  Encouraged to exercise with the program and follow exercise prescription    Comments  Counselor met with Ms. Osbourn Stacie Maryland) today for initial psychosocial evaluation.  She is a 62 year old who has a strong support system with a spouse and (4) adult children.  Stacie Maryland has sarcoidosis and this has taken it's toll on her energy level.  She sleeps well and has a good appetite.  Stacie Maryland denies a history of depression or anxiety or any current symptoms and she reports being in a positive mood most of the time.  Stacie Maryland has minimal stress in her life other than her health.  She has goals to lose some weight while in this program and increase her energy/stamina.  Staff will follow with [facility].    Expected Outcomes  Short:  Stacie Maryland will exercise to increase her energy and for stress in her life.  She will meet with the dietician to address her weight loss goal.   Long:  Stacie Maryland will exercise consistently for positive self care and stress reduction.      Continue Psychosocial Services   Follow up required by staff       Psychosocial Re-Evaluation:   Psychosocial Discharge (Final Psychosocial Re-Evaluation):   Education: Education Goals: Education classes will be provided on a weekly basis, covering required topics. Participant will state understanding/return demonstration of topics presented.  Learning Barriers/Preferences: Learning Barriers/Preferences - 04/03/18 1112      Learning Barriers/Preferences   Learning Barriers  Sight reading glasses and prescription    Learning Preferences  Audio;None       Education  Topics:  Initial Evaluation Education: - Verbal, written and  demonstration of respiratory meds, oximetry and breathing techniques. Instruction on use of nebulizers and MDIs and importance of monitoring MDI activations.   Pulmonary Rehab from 04/11/2018 in [facility] Cardiac and Pulmonary Rehab  Date  04/03/18  Educator  [facility]  Instruction Review Code  1- Verbalizes Understanding      General Nutrition Guidelines/Fats and Fiber: -Group instruction provided by verbal, written material, models and posters to present the general guidelines for heart healthy nutrition. Gives an explanation and review of dietary fats and fiber.   Controlling Sodium/Reading Food Labels: -Group verbal and written material supporting the discussion of sodium use in heart healthy nutrition. Review and explanation with models, verbal and written materials for utilization of the food label.   Exercise Physiology & General Exercise Guidelines: - Group verbal and written instruction with models to review the exercise physiology of the cardiovascular system and associated critical values. Provides general exercise guidelines with specific guidelines to those with heart or lung disease.    Aerobic Exercise & Resistance Training: - Gives group verbal and written instruction on the various components of exercise. Focuses on aerobic and resistive training programs and the benefits of this training and how to safely progress through these programs.   Flexibility, Balance, Mind/Body Relaxation: Provides group verbal/written instruction on the benefits of flexibility and balance training, including mind/body exercise modes such as yoga, pilates and tai chi.  Demonstration and skill practice provided.   Stress and Anxiety: - Provides group verbal and written instruction about the health risks of elevated stress and causes of high stress.  Discuss the correlation between heart/lung disease and anxiety and treatment options. Review  healthy ways to manage with stress and anxiety.   Depression: - Provides group verbal and written instruction on the correlation between heart/lung disease and depressed mood, treatment options, and the stigmas associated with seeking treatment.   Exercise & Equipment Safety: - Individual verbal instruction and demonstration of equipment use and safety with use of the equipment.   Pulmonary Rehab from 04/11/2018 in [facility] Cardiac and Pulmonary Rehab  Date  04/03/18  Educator  [facility]  Instruction Review Code  1- Verbalizes Understanding      Infection Prevention: - Provides verbal and written material to individual with discussion of infection control including proper hand washing and proper equipment cleaning during exercise session.   Pulmonary Rehab from 04/11/2018 in [facility] Cardiac and Pulmonary Rehab  Date  04/03/18  Educator  [facility]  Instruction Review Code  1- Verbalizes Understanding      Falls Prevention: - Provides verbal and written material to individual with discussion of falls prevention and safety.   Pulmonary Rehab from 04/11/2018 in [facility] Cardiac and Pulmonary Rehab  Date  04/03/18  Educator  [facility]  Instruction Review Code  1- Verbalizes Understanding      Diabetes: - Individual verbal and written instruction to review signs/symptoms of diabetes, desired ranges of glucose level fasting, after meals and with exercise. Advice that pre and post exercise glucose checks will be done for 3 sessions at entry of program.   Chronic Lung Diseases: - Group verbal and written instruction to review updates, respiratory medications, advancements in procedures and treatments. Discuss use of supplemental oxygen including available portable oxygen systems, continuous and intermittent flow rates, concentrators, personal use and safety guidelines. Review proper use of inhaler and spacers. Provide informative websites for self-education.    Energy Conservation: - Provide group verbal and written  instruction for methods to conserve energy, plan and organize activities. Instruct  on pacing techniques, use of adaptive equipment and posture/positioning to relieve shortness of breath.   Triggers and Exacerbations: - Group verbal and written instruction to review types of environmental triggers and ways to prevent exacerbations. Discuss weather changes, air quality and the benefits of nasal washing. Review warning signs and symptoms to help prevent infections. Discuss techniques for effective airway clearance, coughing, and vibrations.   AED/CPR: - Group verbal and written instruction with the use of models to demonstrate the basic use of the AED with the basic ABC's of resuscitation.   Anatomy and Physiology of the Lungs: - Group verbal and written instruction with the use of models to provide basic lung anatomy and physiology related to function, structure and complications of lung disease.   Anatomy & Physiology of the Heart: - Group verbal and written instruction and models provide basic cardiac anatomy and physiology, with the coronary electrical and arterial systems. Review of Valvular disease and Heart Failure   Cardiac Medications: - Group verbal and written instruction to review commonly prescribed medications for heart disease. Reviews the medication, class of the drug, and side effects.   Know Your Numbers and Risk Factors: -Group verbal and written instruction about important numbers in your health.  Discussion of what are risk factors and how they play a role in the disease process.  Review of Cholesterol, Blood Pressure, Diabetes, and BMI and the role they play in your overall health.   Sleep Hygiene: -Provides group verbal and written instruction about how sleep can affect your health.  Define sleep hygiene, discuss sleep cycles and impact of sleep habits. Review good sleep hygiene tips.    Pulmonary Rehab from 04/11/2018 in [facility] Cardiac and Pulmonary Rehab  Date   04/11/18  Educator  [facility]  Instruction Review Code  1- Verbalizes Understanding      Other: -Provides group and verbal instruction on various topics (see comments)    Knowledge Questionnaire Score: Knowledge Questionnaire Score - 04/03/18 1112      Knowledge Questionnaire Score   Pre Score  16/18 reviewed        Core Components/Risk Factors/Patient Goals at Admission: Personal Goals and Risk Factors at Admission - 04/03/18 1118      Core Components/Risk Factors/Patient Goals on Admission    Weight Management  Yes;Weight Loss;Weight Maintenance    Intervention  Weight Management: Develop a combined nutrition and exercise program designed to reach desired caloric intake, while maintaining appropriate intake of nutrient and fiber, sodium and fats, and appropriate energy expenditure required for the weight goal.;Weight Management: Provide education and appropriate resources to help participant work on and attain dietary goals.;Weight Management/Obesity: Establish reasonable short term and long term weight goals.    Admit Weight  198 lb 11.2 oz (90.1 kg)    Goal Weight: Short Term  193 lb (87.5 kg)    Goal Weight: Long Term  150 lb (68 kg)    Expected Outcomes  Weight Maintenance: Understanding of the daily nutrition guidelines, which includes 25-35% calories from fat, 7% or less cal from saturated fats, less than 251m cholesterol, less than 1.5gm of sodium, & 5 or more servings of fruits and vegetables daily;Short Term: Continue to assess and modify interventions until short term weight is achieved;Long Term: Adherence to nutrition and physical activity/exercise program aimed toward attainment of established weight goal;Weight Loss: Understanding of general recommendations for a balanced deficit meal plan, which promotes 1-2 lb weight loss per week and includes a negative energy balance of [phone] kcal/d;Understanding recommendations  for meals to include 15-35% energy as protein, 25-35% energy  from fat, 35-60% energy from carbohydrates, less than 219m of dietary cholesterol, 20-35 gm of total fiber daily;Understanding of distribution of calorie intake throughout the day with the consumption of 4-5 meals/snacks    Improve shortness of breath with ADL's  Yes    Intervention  Provide education, individualized exercise plan and daily activity instruction to help decrease symptoms of SOB with activities of daily living.    Expected Outcomes  Short Term: Improve cardiorespiratory fitness to achieve a reduction of symptoms when performing ADLs;Long Term: Be able to perform more ADLs without symptoms or delay the onset of symptoms    Hypertension  Yes    Intervention  Provide education on lifestyle modifcations including regular physical activity/exercise, weight management, moderate sodium restriction and increased consumption of fresh fruit, vegetables, and low fat dairy, alcohol moderation, and smoking cessation.;Monitor prescription use compliance.    Expected Outcomes  Short Term: Continued assessment and intervention until BP is < 140/946mHG in hypertensive participants. < 130/8052mG in hypertensive participants with diabetes, heart failure or chronic kidney disease.;Long Term: Maintenance of blood pressure at goal levels.       Core Components/Risk Factors/Patient Goals Review:    Core Components/Risk Factors/Patient Goals at Discharge (Final Review):    ITP Comments: ITP Comments    Row Name 04/03/18 1038 04/16/18 0945         ITP Comments  Medical Evaluation completed. Chart sent for review and changes to Dr. KleCaryl Comesr Dr. MarEmily Filbertrector of [place] in [facility] encounter 03/22/18  30 day review completed. ITP sent to Dr. MarEmily Filbertrector of [place] ITP unless changes are made by physician         Comments: 30 day review

## 2018-04-18 ENCOUNTER — Telehealth: Payer: Self-pay

## 2018-04-18 NOTE — Telephone Encounter (Signed)
Called to see if Stacie [name] was ok since she has not been in [place] for a week.

## 2018-04-23 DIAGNOSIS — G4733 Obstructive sleep apnea (adult) (pediatric): Secondary | ICD-10-CM | POA: Diagnosis not present

## 2018-04-23 DIAGNOSIS — R0602 Shortness of breath: Secondary | ICD-10-CM | POA: Diagnosis not present

## 2018-04-23 DIAGNOSIS — D860 Sarcoidosis of lung: Secondary | ICD-10-CM | POA: Diagnosis not present

## 2018-04-23 NOTE — Progress Notes (Signed)
Daily Session Note  Patient Details  Name: Stacie [name] MRN: [id number] Date of Birth: [birth year] Referring Provider:     Pulmonary Rehab from 04/03/2018 in [facility] Cardiac and Pulmonary Rehab  Referring Provider  [place]      Encounter Date: 04/23/2018  Check In: Session Check In - 04/23/18 0949      Check-In   Location  [facility]-Cardiac & Pulmonary Rehab    Staff Present  Stacie [name] RCP,RRT,BSRT;Stacie [name] Bar, BS, [facility]ie [name], BS, ACSM CEP, Exercise Physiologist    Supervising physician immediately available to respond to emergencies  Stacie [name] immediately available ER MD    Physician(s)  Stacie [name] and Stacie [name]    Medication changes reported      No    Fall or balance concerns reported     No    Tobacco Cessation  No Change    Warm-up and Cool-down  Performed as group-led instruction    Resistance Training Performed  Yes    VAD Patient?  No      Pain Assessment   Currently in Pain?  No/denies          Social History   Tobacco Use  Smoking Status Never Smoker  Smokeless Tobacco Never Used    Goals Met:  Independence with exercise equipment Exercise tolerated well No report of cardiac concerns or symptoms Strength training completed today  Goals Unmet:  Not Applicable  Comments: Pt able to follow exercise prescription today without complaint.  Will continue to monitor for progression.   Stacie [name] is Medical Director for [name].

## 2018-04-24 DIAGNOSIS — S22070A Wedge compression fracture of T9-T10 vertebra, initial encounter for closed fracture: Secondary | ICD-10-CM | POA: Diagnosis not present

## 2018-04-25 DIAGNOSIS — D860 Sarcoidosis of lung: Secondary | ICD-10-CM

## 2018-04-25 DIAGNOSIS — R0602 Shortness of breath: Secondary | ICD-10-CM | POA: Diagnosis not present

## 2018-04-25 NOTE — Progress Notes (Signed)
Daily Session Note  Patient Details  Name: Stacie [name] MRN: [id number] Date of Birth: [birth year] Referring Provider:     Pulmonary Rehab from 04/03/2018 in [facility] Cardiac and Pulmonary Rehab  Referring Provider  [place]      Encounter Date: 04/25/2018  Check In: Session Check In - 04/25/18 1010      Check-In   Location  [facility]-Cardiac & Pulmonary Rehab    Staff Present  Justin Mend RCP,RRT,BSRT;Nana Addai, RN Vickki Hearing, BA, ACSM CEP, Exercise Physiologist    Supervising physician immediately available to respond to emergencies  [name] immediately available ER MD    Physician(s)  Dr. Joni Fears and Archie Balboa    Medication changes reported      No    Fall or balance concerns reported     No    Warm-up and Cool-down  Performed as group-led instruction    Resistance Training Performed  Yes    VAD Patient?  No      Pain Assessment   Currently in Pain?  No/denies          Social History   Tobacco Use  Smoking Status Never Smoker  Smokeless Tobacco Never Used    Goals Met:  Independence with exercise equipment Exercise tolerated well No report of cardiac concerns or symptoms Strength training completed today  Goals Unmet:  Not Applicable  Comments: Pt able to follow exercise prescription today without complaint.  Will continue to monitor for progression.   Dr. Emily Filbert is Medical Director for [place] and [name].

## 2018-04-27 DIAGNOSIS — R0602 Shortness of breath: Secondary | ICD-10-CM | POA: Diagnosis not present

## 2018-04-27 DIAGNOSIS — D860 Sarcoidosis of lung: Secondary | ICD-10-CM | POA: Diagnosis not present

## 2018-04-27 NOTE — Progress Notes (Signed)
Daily Session Note  Patient Details  Name: Stacie [name] MRN: [id number] Date of Birth: 1955/12/09 Referring Provider:     Pulmonary Rehab from 04/03/2018 in [facility] Cardiac and Pulmonary Rehab  Referring Provider  [place]      Encounter Date: 04/27/2018  Check In: Session Check In - 04/27/18 1017      Check-In   Location  [facility]-Cardiac & Pulmonary Rehab    Staff Present  Justin Mend RCP,RRT,BSRT;Amanda Oletta Darter, BA, ACSM CEP, Exercise Physiologist;Meredith Sherryll Burger, RN BSN    Supervising physician immediately available to respond to emergencies  [name] immediately available ER MD    Physician(s)  Dr. Joni Fears and Corky Downs    Medication changes reported      No    Fall or balance concerns reported     No    Warm-up and Cool-down  Performed as group-led instruction    Resistance Training Performed  Yes    VAD Patient?  No      Pain Assessment   Currently in Pain?  No/denies          Social History   Tobacco Use  Smoking Status Never Smoker  Smokeless Tobacco Never Used    Goals Met:  Independence with exercise equipment Exercise tolerated well No report of cardiac concerns or symptoms Strength training completed today  Goals Unmet:  Not Applicable  Comments: Pt able to follow exercise prescription today without complaint.  Will continue to monitor for progression.   Dr. Emily Filbert is Medical Director for [place] and [name].

## 2018-04-30 DIAGNOSIS — R0602 Shortness of breath: Secondary | ICD-10-CM | POA: Diagnosis not present

## 2018-04-30 DIAGNOSIS — D860 Sarcoidosis of lung: Secondary | ICD-10-CM | POA: Diagnosis not present

## 2018-04-30 NOTE — Progress Notes (Signed)
Daily Session Note  Patient Details  Name: Stacie [name] MRN: [id number] Date of Birth: [birth year] Referring Provider:     Pulmonary Rehab from 04/03/2018 in [facility] Cardiac and Pulmonary Rehab  Referring Provider  [place]      Encounter Date: 04/30/2018  Check In: Session Check In - 04/30/18 1013      Check-In   Location  [facility]-Cardiac & Pulmonary Rehab    Staff Present  Justin Mend RCP,RRT,BSRT;Amanda Oletta Darter, BA, ACSM CEP, Exercise Physiologist;Kelly Amedeo Plenty, BS, ACSM CEP, Exercise Physiologist    Supervising physician immediately available to respond to emergencies  [name] immediately available ER MD    Physician(s)  Dr. Jimmye Norman and Corky Downs    Medication changes reported      No    Fall or balance concerns reported     No    Warm-up and Cool-down  Performed as group-led instruction    Resistance Training Performed  Yes    VAD Patient?  No      Pain Assessment   Currently in Pain?  No/denies          Social History   Tobacco Use  Smoking Status Never Smoker  Smokeless Tobacco Never Used    Goals Met:  Independence with exercise equipment Exercise tolerated well No report of cardiac concerns or symptoms Strength training completed today  Goals Unmet:  Not Applicable  Comments: Pt able to follow exercise prescription today without complaint.  Will continue to monitor for progression.   Dr. Emily Filbert is Medical Director for [place] and [name].

## 2018-05-02 DIAGNOSIS — R0602 Shortness of breath: Secondary | ICD-10-CM | POA: Diagnosis not present

## 2018-05-02 DIAGNOSIS — D860 Sarcoidosis of lung: Secondary | ICD-10-CM

## 2018-05-02 NOTE — Progress Notes (Signed)
Daily Session Note  Patient Details  Name: Stacie [name] MRN: [id number] Date of Birth: 11/28/1955 Referring Provider:     Pulmonary Rehab from 04/03/2018 in [facility] Cardiac and Pulmonary Rehab  Referring Provider  [place]      Encounter Date: 05/02/2018  Check In: Session Check In - 05/02/18 1042      Check-In   Location  [facility]-Cardiac & Pulmonary Rehab    Staff Present  Justin Mend Lorre Nick, MA, RCEP, CCRP, Exercise Physiologist;Allsion [place] Oletta Darter, [place], ACSM CEP, Exercise Physiologist    Supervising physician immediately available to respond to emergencies  [name] immediately available ER MD    Physician(s)  Jacqualine Code  and Jimmye Norman    Medication changes reported      No    Fall or balance concerns reported     No    Warm-up and Cool-down  Performed as group-led instruction    Resistance Training Performed  Yes    VAD Patient?  No    PAD/SET Patient?  No      Pain Assessment   Currently in Pain?  No/denies    Multiple Pain Sites  No          Social History   Tobacco Use  Smoking Status Never Smoker  Smokeless Tobacco Never Used    Goals Met:  Independence with exercise equipment Exercise tolerated well No report of cardiac concerns or symptoms Strength training completed today  Goals Unmet:  Not Applicable  Comments: Pt able to follow exercise prescription today without complaint.  Will continue to monitor for progression.    Dr. Emily Filbert is Medical Director for [place] and [name].

## 2018-05-04 DIAGNOSIS — D860 Sarcoidosis of lung: Secondary | ICD-10-CM | POA: Diagnosis not present

## 2018-05-04 DIAGNOSIS — R0602 Shortness of breath: Secondary | ICD-10-CM | POA: Diagnosis not present

## 2018-05-04 NOTE — Progress Notes (Signed)
Daily Session Note  Patient Details  Name: Stacie [name] MRN: [id number] Date of Birth: Sep 24, 1956 Referring Provider:     Pulmonary Rehab from 04/03/2018 in [facility] Cardiac and Pulmonary Rehab  Referring Provider  [place]      Encounter Date: 05/04/2018  Check In: Session Check In - 05/04/18 1012      Check-In   Location  [facility]-Cardiac & Pulmonary Rehab    Staff Present  Justin Mend RCP,RRT,BSRT;Meredith Sherryll Burger, RN Vickki Hearing, BA, ACSM CEP, Exercise Physiologist    Supervising physician immediately available to respond to emergencies  [name] immediately available ER MD    Physician(s)  Dr. Mariea Clonts and Burlene Arnt    Medication changes reported      No    Fall or balance concerns reported     No    Warm-up and Cool-down  Performed as group-led instruction    Resistance Training Performed  Yes    VAD Patient?  No      Pain Assessment   Currently in Pain?  No/denies          Social History   Tobacco Use  Smoking Status Never Smoker  Smokeless Tobacco Never Used    Goals Met:  Independence with exercise equipment Exercise tolerated well No report of cardiac concerns or symptoms Strength training completed today  Goals Unmet:  Not Applicable  Comments: Pt able to follow exercise prescription today without complaint.  Will continue to monitor for progression.   Dr. Emily Filbert is Medical Director for [place] and [name]monary [facility].

## 2018-05-07 DIAGNOSIS — R0602 Shortness of breath: Secondary | ICD-10-CM | POA: Diagnosis not present

## 2018-05-07 DIAGNOSIS — D860 Sarcoidosis of lung: Secondary | ICD-10-CM

## 2018-05-07 NOTE — Progress Notes (Signed)
Daily Session Note  Patient Details  Name: Stacie [name] MRN: [id number] Date of Birth: Feb 22, 1956 Referring Provider:     Pulmonary Rehab from 04/03/2018 in [facility] Cardiac and Pulmonary Rehab  Referring Provider  [place]      Encounter Date: 05/07/2018  Check In: Session Check In - 05/07/18 1021      Check-In   Location  [facility]-Cardiac & Pulmonary Rehab    Staff Present  Justin Mend Jaci Carrel, BS, ACSM CEP, Exercise Physiologist;Amanda Oletta Darter, [place], ACSM CEP, Exercise Physiologist    Supervising physician immediately available to respond to emergencies  [name] immediately available ER MD    Physician(s)  Dr. Cinda Quest and Dr. Archie Balboa    Medication changes reported      No    Fall or balance concerns reported     No    Tobacco Cessation  No Change    Warm-up and Cool-down  Performed as group-led instruction    Resistance Training Performed  Yes    VAD Patient?  No    PAD/SET Patient?  No      Pain Assessment   Currently in Pain?  No/denies    Multiple Pain Sites  No          Social History   Tobacco Use  Smoking Status Never Smoker  Smokeless Tobacco Never Used    Goals Met:  Proper associated with RPD/PD & O2 Sat Independence with exercise equipment Exercise tolerated well No report of cardiac concerns or symptoms Strength training completed today  Goals Unmet:  Not Applicable  Comments: Pt able to follow exercise prescription today without complaint.  Will continue to monitor for progression.    Dr. Emily Filbert is Medical Director for [place] and [name]monary [facility].

## 2018-05-09 ENCOUNTER — Ambulatory Visit (INDEPENDENT_AMBULATORY_CARE_PROVIDER_SITE_OTHER): Payer: BLUE CROSS/BLUE SHIELD | Admitting: Internal Medicine

## 2018-05-09 ENCOUNTER — Other Ambulatory Visit
Admission: RE | Admit: 2018-05-09 | Discharge: 2018-05-09 | Disposition: A | Discharge status: Home / Self Care | Payer: BLUE CROSS/BLUE SHIELD | Source: Ambulatory Visit | Attending: Internal Medicine | Admitting: Internal Medicine

## 2018-05-09 ENCOUNTER — Encounter: Payer: Self-pay | Admitting: Internal Medicine

## 2018-05-09 VITALS — BP 158/94 | HR 118 | Ht 66.5 in | Wt 191.0 lb

## 2018-05-09 DIAGNOSIS — F5101 Primary insomnia: Secondary | ICD-10-CM

## 2018-05-09 DIAGNOSIS — E559 Vitamin D deficiency, unspecified: Secondary | ICD-10-CM | POA: Diagnosis not present

## 2018-05-09 DIAGNOSIS — R0602 Shortness of breath: Secondary | ICD-10-CM | POA: Diagnosis not present

## 2018-05-09 DIAGNOSIS — F329 Major depressive disorder, single episode, unspecified: Secondary | ICD-10-CM | POA: Diagnosis not present

## 2018-05-09 DIAGNOSIS — E039 Hypothyroidism, unspecified: Secondary | ICD-10-CM | POA: Insufficient documentation

## 2018-05-09 DIAGNOSIS — I10 Essential (primary) hypertension: Secondary | ICD-10-CM | POA: Insufficient documentation

## 2018-05-09 DIAGNOSIS — R4589 Other symptoms and signs involving emotional state: Secondary | ICD-10-CM

## 2018-05-09 DIAGNOSIS — D860 Sarcoidosis of lung: Secondary | ICD-10-CM | POA: Diagnosis not present

## 2018-05-09 DIAGNOSIS — G4733 Obstructive sleep apnea (adult) (pediatric): Secondary | ICD-10-CM

## 2018-05-09 DIAGNOSIS — S22000G Wedge compression fracture of unspecified thoracic vertebra, subsequent encounter for fracture with delayed healing: Secondary | ICD-10-CM

## 2018-05-09 LAB — CBC WITH DIFFERENTIAL/PLATELET
Basophils Absolute: 0.1 10*3/uL (ref 0–0.1)
Basophils Relative: 1 %
Eosinophils Absolute: 0.1 10*3/uL (ref 0–0.7)
Eosinophils Relative: 1 %
HCT: 43.6 % (ref 35.0–47.0)
Hemoglobin: 14.5 g/dL (ref 12.0–16.0)
Lymphocytes Relative: 8 %
Lymphs Abs: 1.1 10*3/uL (ref 1.0–3.6)
MCH: 28.9 pg (ref 26.0–34.0)
MCHC: 33.3 g/dL (ref 32.0–36.0)
MCV: 86.6 fL (ref 80.0–100.0)
Monocytes Absolute: 0.6 10*3/uL (ref 0.2–0.9)
Monocytes Relative: 4 %
Neutro Abs: 11.2 10*3/uL — ABNORMAL HIGH (ref 1.4–6.5)
Neutrophils Relative %: 86 %
Platelets: 296 10*3/uL (ref 150–440)
RBC: 5.04 MIL/uL (ref 3.80–5.20)
RDW: 13.8 % (ref 11.5–14.5)
WBC: 13 10*3/uL — ABNORMAL HIGH (ref 3.6–11.0)

## 2018-05-09 LAB — COMPREHENSIVE METABOLIC PANEL
ALT: 28 U/L (ref 0–44)
AST: 29 U/L (ref 15–41)
Albumin: 4.1 g/dL (ref 3.5–5.0)
Alkaline Phosphatase: 87 U/L (ref 38–126)
Anion gap: 14 (ref 5–15)
BUN: 22 mg/dL (ref 8–23)
CO2: 27 mmol/L (ref 22–32)
Calcium: 9.5 mg/dL (ref 8.9–10.3)
Chloride: 98 mmol/L (ref 98–111)
Creatinine, Ser: 1.1 mg/dL — ABNORMAL HIGH (ref 0.44–1.00)
GFR calc Af Amer: >60 mL/min (ref >60)
GFR calc non Af Amer: 52 mL/min — ABNORMAL LOW (ref >60)
Glucose, Bld: 124 mg/dL — ABNORMAL HIGH (ref 70–99)
Potassium: 3.6 mmol/L (ref 3.5–5.1)
Sodium: 139 mmol/L (ref 135–145)
Total Bilirubin: 0.6 mg/dL (ref 0.3–1.2)
Total Protein: 7.8 g/dL (ref 6.5–8.1)

## 2018-05-09 LAB — TSH: TSH: 0.121 u[IU]/mL — ABNORMAL LOW (ref 0.350–4.500)

## 2018-05-09 LAB — T4, FREE: Free T4: 1.35 ng/dL (ref 0.82–1.77)

## 2018-05-09 NOTE — Progress Notes (Signed)
Daily Session Note  Patient Details  Name: Stacie [name] MRN: [id number] Date of Birth: [birth year] Referring Provider:     Pulmonary Rehab from 04/03/2018 in [facility] Cardiac and Pulmonary Rehab  Referring Provider  [place]      Encounter Date: 05/09/2018  Check In: Session Check In - 05/09/18 1020      Check-In   Location  [facility]-Cardiac & Pulmonary Rehab    Staff Present  Justin Mend Lorre Nick, MA, RCEP, CCRP, Exercise Physiologist;Amanda Oletta Darter, [place], ACSM CEP, Exercise Physiologist    Supervising physician immediately available to respond to emergencies  See telemetry face sheet for immediately available ER MD    Physician(s)  Burlene Arnt and Paduchowski    Medication changes reported      No    Fall or balance concerns reported     No    Warm-up and Cool-down  Performed as group-led instruction    Resistance Training Performed  Yes    VAD Patient?  No    PAD/SET Patient?  No      Pain Assessment   Currently in Pain?  No/denies    Multiple Pain Sites  No          Social History   Tobacco Use  Smoking Status Never Smoker  Smokeless Tobacco Never Used    Goals Met:  Proper associated with RPD/PD & O2 Sat Independence with exercise equipment Exercise tolerated well Strength training completed today  Goals Unmet:  Not Applicable  Comments: Pt able to follow exercise prescription today without complaint.  Will continue to monitor for progression.    Dr. Emily Filbert is Medical Director for [place] and [name]monary [facility].

## 2018-05-09 NOTE — Progress Notes (Signed)
Date:  05/09/2018   Name:  Stacie [name]   DOB:  02-Jan-1956   MRN:  [id number]   Chief Complaint: Hypertension (Seeing pulmonary rehab and BP has been low and feeling lathargic. Tired, and no appetite. )  Hypertension  This is a chronic problem. The problem has been rapidly improving (BP at rehab had been low 110 or less over 60) since onset. Associated symptoms include shortness of breath. Pertinent negatives include no chest pain, headaches or palpitations. Past treatments include calcium channel blockers (amlodipine 10 mg cause mild edema). Identifiable causes of hypertension include a thyroid problem.  Thyroid Problem  Presents for follow-up visit. Symptoms include depressed mood and fatigue. Patient reports no anxiety, hoarse voice or palpitations. Symptom course: stable except for sx that may be due to thyroid. No labs in one year.  Insomnia  Primary symptoms: sleep disturbance, difficulty falling asleep.  The current episode started more than one month. The onset quality is undetermined. The problem occurs every several days. The problem has been waxing and waning since onset. Exacerbated by: can not determine any. Treatments tried: lavender essential oils. The treatment provided mild relief. PMH includes: depression.  Depression         This is a chronic problem.  Associated symptoms include fatigue, insomnia, decreased interest and appetite change.  Associated symptoms include no myalgias, no headaches, not sad and no suicidal ideas.     The symptoms are aggravated by nothing.  Past treatments include nothing.  Past medical history includes thyroid problem.    Compression fracture -  Noted about 5 weeks ago on Xray.  She had kyphoplasty and is now on Forteo.  She weaned off of the pain medications.  She was seen in follow up last week and released to do usual activity.  Vitamin D def. - recently had low levels so took high dose for a month and now on 2000 IU daily.  Started Forteo  as well for OP through Dr. Jefm Bryant.  Review of Systems  Constitutional: Positive for appetite change and fatigue. Negative for chills and fever.  HENT: Negative for hoarse voice and trouble swallowing.   Respiratory: Positive for shortness of breath. Negative for chest tightness and wheezing.   Cardiovascular: Negative for chest pain and palpitations.  Gastrointestinal: Negative for abdominal pain.  Musculoskeletal: Negative for arthralgias, back pain and myalgias.  Skin: Negative for color change and wound.  Neurological: Negative for dizziness and headaches.  Hematological: Negative for adenopathy.  Psychiatric/Behavioral: Positive for depression, dysphoric mood and sleep disturbance. Negative for agitation and suicidal ideas. The patient has insomnia. The patient is not nervous/anxious.     Patient Active Problem List   Diagnosis Date Noted  . Compression fx, thoracic spine ([place]) 12/04/2017  . Morbid obesity due to excess calories ([place]) 05/08/2017  . Hyperlipidemia, mixed 04/21/2017  . Elevated blood pressure reading 04/20/2017  . Cough variant asthma  vs UACS  02/20/2017  . Hypothyroidism, acquired 03/27/2015  . Essential hypertension 03/27/2015  . H/O abnormal cervical Papanicolaou smear 03/27/2015  . H/O renal calculi 03/27/2015  . Allergic rhinitis 01/07/2015  . Obstructive sleep apnea 09/29/2014  . Other malaise and fatigue 06/03/2014  . Asthma, chronic 02/24/2014  . Cough 04/30/2013  . Pulmonary sarcoidosis ([place]) 03/12/2013  . CKD (chronic kidney disease) stage 3, GFR 30-59 ml/min ([facility]) 03/12/2013  . Calcium blood increased 05/23/2012  . Besnier-Boeck disease 02/08/2011    Prior to Admission medications   Medication Sig Start Date End  Date Taking? Authorizing Provider  Acetaminophen-Codeine (TYLENOL/CODEINE #3) 300-30 MG tablet Take 1 tablet by mouth every 4 (four) hours as needed for pain. 04/02/18  Yes Glean Hess, MD  albuterol (VENTOLIN HFA) 108 (90 Base)  MCG/ACT inhaler Inhale 2 puffs into the lungs every 6 (six) hours as needed for wheezing. 11/19/15  Yes Laverle Hobby, MD  amLODipine (NORVASC) 10 MG tablet Take 10 mg by mouth daily. 03/22/18  Yes [provider]  budesonide-formoterol (SYMBICORT) 80-4.5 MCG/ACT inhaler Take 2 puffs first thing in am and then another 2 puffs about 12 hours later. 05/08/17  Yes Tanda Rockers, MD  FORTEO 600 MCG/2.4ML SOLN  03/11/18  Yes [provider]  GLUCOSAMINE PO Take 1 tablet by mouth daily.   Yes [provider]  HYDROcodone-acetaminophen (NORCO/VICODIN) 5-325 MG tablet Take 1 tablet by mouth every 4 (four) hours as needed. 03/06/18  Yes [provider]  mometasone (NASONEX) 50 MCG/ACT nasal spray Place 1 spray into the nose 2 (two) times daily.   Yes [provider]  Multiple Vitamin (MULTIVITAMIN) capsule Take 1 capsule by mouth 2 (two) times daily.    Yes [provider]  predniSONE (DELTASONE) 10 MG tablet Take 7.5 mg by mouth.  07/18/16  Yes [provider]  Respiratory Therapy Supplies (FLUTTER) DEVI Use as directed 10/26/17  Yes Tanda Rockers, MD  SYNTHROID 137 MCG tablet TAKE 1 TABLET (137 MCG TOTAL) BY MOUTH DAILY BEFORE BREAKFAST. 04/02/18  Yes Glean Hess, MD  cetirizine (ZYRTEC) 10 MG tablet Take 10 mg by mouth daily as needed.     [provider]    Allergies  Allergen Reactions  . Nitrofurantoin Nausea Only  . Tramadol     Nausea and vomiting  . Sulfa Antibiotics Rash    As an infant    Past Surgical History:  Procedure Laterality Date  . COLON SURGERY     "colon was fused to bladder - operated on both"  . PARS PLANA VITRECTOMY Right 05/20/2015   Procedure: PARS PLANA VITRECTOMY WITH 25 GAUGE, laser;  Surgeon: Milus Height, MD;  Location: [facility] ORS;  Service: Ophthalmology;  Laterality: Right;  . PARTIAL HYSTERECTOMY  1990  . TUBAL LIGATION      Social History   Tobacco Use  . Smoking status:  Never Smoker  . Smokeless tobacco: Never Used  Substance Use Topics  . Alcohol use: No  . Drug use: No     Medication list has been reviewed and updated.  Current Meds  Medication Sig  . Acetaminophen-Codeine (TYLENOL/CODEINE #3) 300-30 MG tablet Take 1 tablet by mouth every 4 (four) hours as needed for pain.  Marland Kitchen albuterol (VENTOLIN HFA) 108 (90 Base) MCG/ACT inhaler Inhale 2 puffs into the lungs every 6 (six) hours as needed for wheezing.  Marland Kitchen amLODipine (NORVASC) 10 MG tablet Take 10 mg by mouth daily.  . budesonide-formoterol (SYMBICORT) 80-4.5 MCG/ACT inhaler Take 2 puffs first thing in am and then another 2 puffs about 12 hours later.  Marland Kitchen FORTEO 600 MCG/2.4ML SOLN   . GLUCOSAMINE PO Take 1 tablet by mouth daily.  Marland Kitchen HYDROcodone-acetaminophen (NORCO/VICODIN) 5-325 MG tablet Take 1 tablet by mouth every 4 (four) hours as needed.  . mometasone (NASONEX) 50 MCG/ACT nasal spray Place 1 spray into the nose 2 (two) times daily.  . Multiple Vitamin (MULTIVITAMIN) capsule Take 1 capsule by mouth 2 (two) times daily.   . predniSONE (DELTASONE) 10 MG tablet Take 7.5 mg by mouth.   Marland Kitchen  Respiratory Therapy Supplies (FLUTTER) DEVI Use as directed  . SYNTHROID 137 MCG tablet TAKE 1 TABLET (137 MCG TOTAL) BY MOUTH DAILY BEFORE BREAKFAST.    PHQ 2/9 Scores 04/03/2018 04/02/2018 04/20/2017  PHQ - 2 Score 4 2 0  PHQ- 9 Score 13 4 -    Physical Exam  Constitutional: She is oriented to person, place, and time. She appears well-developed. No distress.  HENT:  Head: Normocephalic and atraumatic.  Neck: Trachea normal and full passive range of motion without pain. Carotid bruit is not present. No thyroid mass present.  Cardiovascular: Normal rate, regular rhythm and normal heart sounds.  Pulmonary/Chest: Effort normal. No accessory muscle usage or stridor. No respiratory distress. She has wheezes (few scattered).  Musculoskeletal: Normal range of motion. She exhibits edema (trace edema).  Neurological: She  is alert and oriented to person, place, and time.  Skin: Skin is warm and dry. No rash noted.  Psychiatric: Her speech is normal and behavior is normal. Judgment and thought content normal. Cognition and memory are normal. She exhibits a depressed mood. She expresses no suicidal ideation. She expresses no suicidal plans.  Nursing note and vitals reviewed.   BP (!) 158/94   Pulse (!) 118   Ht 5' 6.5" (1.689 m)   Wt 191 lb (86.6 kg)   SpO2 (!) 88%   BMI 30.37 kg/m   Assessment and Plan: 1. Essential hypertension Resume amlodipine 5 mg per day Follow up in 6 weeks - CBC with Differential/Platelet - Comprehensive metabolic panel  2. Hypothyroidism, acquired Check labs and advise on dose of medication - Thyroid Panel With TSH  3. Depressed mood Should strongly consider trying an SSRI Pros and cons of medication discussed at length  4. Primary insomnia Continue essential oils Trial of melatonin  5. Closed compression fracture of thoracic vertebra with delayed healing, subsequent encounter Doing well s/p kyphoplasty  6. Obstructive sleep apnea Continue use of CPAP nightly  7. Vitamin D deficiency Continue supplement daily Check levels - VITAMIN D 25 Hydroxy (Vit-D Deficiency, Fractures)   No orders of the defined types were placed in this encounter.   Partially dictated using Editor, commissioning. Any errors are unintentional.  Halina Maidens, MD [facility]  05/09/2018   There are no diagnoses linked to this encounter.

## 2018-05-09 NOTE — Patient Instructions (Signed)
Resume amlodipine at 5 mg per day

## 2018-05-10 LAB — T3, FREE: T3, Free: 2.8 pg/mL (ref 2.0–4.4)

## 2018-05-10 LAB — VITAMIN D 25 HYDROXY (VIT D DEFICIENCY, FRACTURES): Vit D, 25-Hydroxy: 28.3 ng/mL — ABNORMAL LOW (ref 30.0–100.0)

## 2018-05-11 ENCOUNTER — Other Ambulatory Visit: Payer: Self-pay | Admitting: Internal Medicine

## 2018-05-11 DIAGNOSIS — F39 Unspecified mood [affective] disorder: Secondary | ICD-10-CM

## 2018-05-11 MED ORDER — ESCITALOPRAM OXALATE 10 MG PO TABS: 10.0000 mg | ORAL_TABLET | Freq: Every day | ORAL | 1 refills | Status: DC

## 2018-05-14 ENCOUNTER — Encounter: Payer: BLUE CROSS/BLUE SHIELD | Admitting: *Deleted

## 2018-05-14 DIAGNOSIS — R0602 Shortness of breath: Secondary | ICD-10-CM | POA: Diagnosis not present

## 2018-05-14 DIAGNOSIS — D860 Sarcoidosis of lung: Secondary | ICD-10-CM

## 2018-05-14 NOTE — Progress Notes (Signed)
Pulmonary Individual Treatment Plan  Patient Details  Name: Stacie [name] MRN: [id number] Date of Birth: September 24, 1963 Referring Provider:     Pulmonary Rehab from 04/03/2018 in [facility] Cardiac and Pulmonary Rehab  Referring Provider  Rackley      Initial Encounter Date:    Pulmonary Rehab from 04/03/2018 in [facility] Cardiac and Pulmonary Rehab  Date  04/03/18      Visit Diagnosis: Pulmonary sarcoidosis ([place])  Patient's Home Medications on Admission:  Current Outpatient Medications:  .  Acetaminophen-Codeine (TYLENOL/CODEINE #3) 300-30 MG tablet, Take 1 tablet by mouth every 4 (four) hours as needed for pain., Disp: 42 tablet, Rfl: 0 .  albuterol (VENTOLIN HFA) 108 (90 Base) MCG/ACT inhaler, Inhale 2 puffs into the lungs every 6 (six) hours as needed for wheezing., Disp: 1 Inhaler, Rfl: 2 .  amLODipine (NORVASC) 10 MG tablet, Take 10 mg by mouth daily., Disp: , Rfl: 11 .  budesonide-formoterol (SYMBICORT) 80-4.5 MCG/ACT inhaler, Take 2 puffs first thing in am and then another 2 puffs about 12 hours later., Disp: 1 Inhaler, Rfl: 11 .  cetirizine (ZYRTEC) 10 MG tablet, Take 10 mg by mouth daily as needed. , Disp: , Rfl:  .  escitalopram (LEXAPRO) 10 MG tablet, Take 1 tablet (10 mg total) by mouth daily., Disp: 30 tablet, Rfl: 1 .  FORTEO 600 MCG/2.4ML SOLN, , Disp: , Rfl:  .  GLUCOSAMINE PO, Take 1 tablet by mouth daily., Disp: , Rfl:  .  HYDROcodone-acetaminophen (NORCO/VICODIN) 5-325 MG tablet, Take 1 tablet by mouth every 4 (four) hours as needed., Disp: , Rfl: 0 .  mometasone (NASONEX) 50 MCG/ACT nasal spray, Place 1 spray into the nose 2 (two) times daily., Disp: , Rfl:  .  Multiple Vitamin (MULTIVITAMIN) capsule, Take 1 capsule by mouth 2 (two) times daily. , Disp: , Rfl:  .  NON FORMULARY, CPAP nightly, Disp: , Rfl:  .  predniSONE (DELTASONE) 10 MG tablet, Take 7.5 mg by mouth. , Disp: , Rfl:  .  Respiratory Therapy Supplies (FLUTTER) DEVI, Use as directed, Disp: 1 each, Rfl: 0 .   SYNTHROID 137 MCG tablet, TAKE 1 TABLET (137 MCG TOTAL) BY MOUTH DAILY BEFORE BREAKFAST., Disp: 90 tablet, Rfl: 1  Past Medical History: Past Medical History:  Diagnosis Date  . Arrhythmia    patient unaware if this is current  . Asthma   . HOH (hard of hearing)    wear aids  . Hypothyroidism   . IBS (irritable bowel syndrome)   . Sarcoid   . Sarcoidosis   . Seasonal allergies   . Sleep apnea CPAP with O2    Tobacco Use: Social History   Tobacco Use  Smoking Status Never Smoker  Smokeless Tobacco Never Used    Labs: Recent Review Scientist, physiological    Labs for ITP Cardiac and Pulmonary Rehab Latest Ref Rng & Units 04/20/2017   Cholestrol 100 - 199 mg/dL 230(H)   LDLCALC 0 - 99 mg/dL 127(H)   HDL >39 mg/dL 54   Trlycerides 0 - 149 mg/dL 246(H)   Hemoglobin A1c 4.8 - 5.6 % 6.0(H)       Pulmonary Assessment Scores: Pulmonary Assessment Scores    Row Name 04/03/18 1108         ADL UCSD   ADL Phase  Entry     SOB Score total  80     Rest  2     Walk  3     Stairs  5  Bath  4     Dress  3     Shop  4       CAT Score   CAT Score  22       mMRC Score   mMRC Score  3        Pulmonary Function Assessment: Pulmonary Function Assessment - 04/03/18 1112      Breath   Bilateral Breath Sounds  Wheezes;Decreased slight wheezes in LLL    Shortness of Breath  Yes;Limiting activity       Exercise Target Goals:    Exercise Program Goal: Individual exercise prescription set using results from initial 6 min walk test and THRR while considering  patient's activity barriers and safety.    Exercise Prescription Goal: Initial exercise prescription builds to 30-45 minutes a day of aerobic activity, 2-3 days per week.  Home exercise guidelines will be given to patient during program as part of exercise prescription that the participant will acknowledge.  Activity Barriers & Risk Stratification:   6 Minute Walk: 6 Minute Walk    Row Name 04/03/18 1301          6 Minute Walk   Distance  786 feet     Walk Time  4.9 minutes     # of Rest Breaks  1     MPH  1.82     METS  2.96     RPE  18     Perceived Dyspnea   3     VO2 Peak  10.4     Symptoms  No     Resting HR  121 bpm     Resting BP  128/66     Resting Oxygen Saturation   92 %     Exercise Oxygen Saturation  during 6 min walk  82 %     Max Ex. HR  133 bpm     Max Ex. BP  176/72     2 Minute Post BP  140/76       Interval HR   1 Minute HR  124     2 Minute HR  124     3 Minute HR  126     4 Minute HR  129     5 Minute HR  133     6 Minute HR  130     2 Minute Post HR  123     Interval Heart Rate?  Yes       Interval Oxygen   Interval Oxygen?  Yes     Baseline Oxygen Saturation %  92 %     1 Minute Oxygen Saturation %  93 %     1 Minute Liters of Oxygen  0 L     2 Minute Oxygen Saturation %  89 %     2 Minute Liters of Oxygen  0 L     3 Minute Oxygen Saturation %  85 %     3 Minute Liters of Oxygen  0 L     4 Minute Oxygen Saturation %  82 %     4 Minute Liters of Oxygen  0 L     5 Minute Oxygen Saturation %  86 %     5 Minute Liters of Oxygen  0 L     6 Minute Oxygen Saturation %  86 %     6 Minute Liters of Oxygen  0 L     2 Minute Post Oxygen Saturation %  93 %       Oxygen Initial Assessment: Oxygen Initial Assessment - 04/03/18 1115      Home Oxygen   Home Oxygen Device  Portable Concentrator    Sleep Oxygen Prescription  CPAP    Liters per minute  2    Home Exercise Oxygen Prescription  None    Home at Rest Exercise Oxygen Prescription  None    Compliance with Home Oxygen Use  Yes      Initial 6 min Walk   Oxygen Used  None      Program Oxygen Prescription   Program Oxygen Prescription  None      Intervention   Short Term Goals  To learn and exhibit compliance with exercise, home and travel O2 prescription;To learn and understand importance of maintaining oxygen saturations>88%;To learn and demonstrate proper use of respiratory medications;To learn and  demonstrate proper pursed lip breathing techniques or other breathing techniques.;To learn and understand importance of monitoring SPO2 with pulse oximeter and demonstrate accurate use of the pulse oximeter.    Long  Term Goals  Exhibits compliance with exercise, home and travel O2 prescription;Verbalizes importance of monitoring SPO2 with pulse oximeter and return demonstration;Maintenance of O2 saturations>88%;Exhibits proper breathing techniques, such as pursed lip breathing or other method taught during program session;Compliance with respiratory medication;Demonstrates proper use of MDI's       Oxygen Re-Evaluation: Oxygen Re-Evaluation    Row Name 04/09/18 1027             Program Oxygen Prescription   Program Oxygen Prescription  None         Home Oxygen   Home Oxygen Device  Portable Concentrator       Sleep Oxygen Prescription  CPAP       Liters per minute  2       Home Exercise Oxygen Prescription  None       Home at Rest Exercise Oxygen Prescription  None       Compliance with Home Oxygen Use  Yes         Goals/Expected Outcomes   Short Term Goals  To learn and exhibit compliance with exercise, home and travel O2 prescription;To learn and understand importance of maintaining oxygen saturations>88%;To learn and demonstrate proper use of respiratory medications;To learn and demonstrate proper pursed lip breathing techniques or other breathing techniques.;To learn and understand importance of monitoring SPO2 with pulse oximeter and demonstrate accurate use of the pulse oximeter.       Long  Term Goals  Exhibits compliance with exercise, home and travel O2 prescription;Verbalizes importance of monitoring SPO2 with pulse oximeter and return demonstration;Maintenance of O2 saturations>88%;Exhibits proper breathing techniques, such as pursed lip breathing or other method taught during program session;Compliance with respiratory medication;Demonstrates proper use of MDI's        Comments  Reviewed PLB technique with pt.  Talked about how it work and it's important to maintaining his exercise saturations.         Goals/Expected Outcomes  Short: Become more profiecient at using PLB.   Long: Become independent at using PLB.          Oxygen Discharge (Final Oxygen Re-Evaluation): Oxygen Re-Evaluation - 04/09/18 1027      Program Oxygen Prescription   Program Oxygen Prescription  None      Home Oxygen   Home Oxygen Device  Portable Concentrator    Sleep Oxygen Prescription  CPAP    Liters per minute  2  Home Exercise Oxygen Prescription  None    Home at Rest Exercise Oxygen Prescription  None    Compliance with Home Oxygen Use  Yes      Goals/Expected Outcomes   Short Term Goals  To learn and exhibit compliance with exercise, home and travel O2 prescription;To learn and understand importance of maintaining oxygen saturations>88%;To learn and demonstrate proper use of respiratory medications;To learn and demonstrate proper pursed lip breathing techniques or other breathing techniques.;To learn and understand importance of monitoring SPO2 with pulse oximeter and demonstrate accurate use of the pulse oximeter.    Long  Term Goals  Exhibits compliance with exercise, home and travel O2 prescription;Verbalizes importance of monitoring SPO2 with pulse oximeter and return demonstration;Maintenance of O2 saturations>88%;Exhibits proper breathing techniques, such as pursed lip breathing or other method taught during program session;Compliance with respiratory medication;Demonstrates proper use of MDI's    Comments  Reviewed PLB technique with pt.  Talked about how it work and it's important to maintaining his exercise saturations.      Goals/Expected Outcomes  Short: Become more profiecient at using PLB.   Long: Become independent at using PLB.       Initial Exercise Prescription: Initial Exercise Prescription - 04/03/18 1300      Date of Initial Exercise RX and Referring  Provider   Date  04/03/18    Referring Provider  Rackley      Treadmill   MPH  1.5    Grade  0    Minutes  15    METs  2.15      NuStep   Level  2    SPM  80    Minutes  15    METs  2.1      Biostep-RELP   Level  2    SPM  50    Minutes  15    METs  2      Prescription Details   Frequency (times per week)  3    Duration  Progress to 45 minutes of aerobic exercise without signs/symptoms of physical distress      Intensity   THRR 40-80% of Max Heartrate  135-150    Ratings of Perceived Exertion  11-15    Perceived Dyspnea  0-4      Resistance Training   Training Prescription  Yes    Weight  3 lb    Reps  10-15       Perform Capillary Blood Glucose checks as needed.  Exercise Prescription Changes: Exercise Prescription Changes    Row Name 04/18/18 1400 05/02/18 1200           Response to Exercise   Blood Pressure (Admit)  126/62  110/68      Blood Pressure (Exercise)  134/84  -      Blood Pressure (Exit)  118/60  118/66      Heart Rate (Admit)  107 bpm  106 bpm      Heart Rate (Exercise)  122 bpm  106 bpm      Heart Rate (Exit)  108 bpm  105 bpm      Oxygen Saturation (Admit)  91 %  93 %      Oxygen Saturation (Exercise)  88 %  93 %      Oxygen Saturation (Exit)  92 %  91 %      Rating of Perceived Exertion (Exercise)  17  14      Perceived Dyspnea (Exercise)  4  3  Symptoms  SOB  -      Comments  second full day of exercise  -      Duration  Progress to 45 minutes of aerobic exercise without signs/symptoms of physical distress  Continue with 45 min of aerobic exercise without signs/symptoms of physical distress.      Intensity  THRR unchanged  THRR unchanged meeting RPE goals        Progression   Progression  Continue to progress workloads to maintain intensity without signs/symptoms of physical distress.  Continue to progress workloads to maintain intensity without signs/symptoms of physical distress.      Average METs  1.77  2.25         Resistance Training   Training Prescription  Yes  Yes      Weight  3 lb  2 lb      Reps  10-15  10-15        Interval Training   Interval Training  No  No        Treadmill   MPH  1.3  -      Grade  0  -      Minutes  15  -      METs  2  -        NuStep   Level  2  2      SPM  84  80      Minutes  15  15      METs  2.3  2.5        Biostep-RELP   Level  2  2      SPM  50  50      Minutes  15  15      METs  1  2         Exercise Comments: Exercise Comments    Row Name 04/09/18 1027           Exercise Comments  First full day of exercise!  Patient was oriented to gym and equipment including functions, settings, policies, and procedures.  Patient's individual exercise prescription and treatment plan were reviewed.  All starting workloads were established based on the results of the 6 minute walk test done at initial orientation visit.  The plan for exercise progression was also introduced and progression will be customized based on patient's performance and goals.          Exercise Goals and Review: Exercise Goals    Row Name 04/03/18 1300             Exercise Goals   Increase Physical Activity  Yes       Intervention  Provide advice, education, support and counseling about physical activity/exercise needs.;Develop an individualized exercise prescription for aerobic and resistive training based on initial evaluation findings, risk stratification, comorbidities and participant's personal goals.       Expected Outcomes  Short Term: Attend rehab on a regular basis to increase amount of physical activity.;Long Term: Add in home exercise to make exercise part of routine and to increase amount of physical activity.;Long Term: Exercising regularly at least 3-5 days a week.       Increase Strength and Stamina  Yes       Intervention  Provide advice, education, support and counseling about physical activity/exercise needs.;Develop an individualized exercise prescription for  aerobic and resistive training based on initial evaluation findings, risk stratification, comorbidities and participant's personal goals.       Expected Outcomes  Short Term: Increase workloads from initial exercise prescription for resistance, speed, and METs.;Short Term: Perform resistance training exercises routinely during rehab and add in resistance training at home;Long Term: Improve cardiorespiratory fitness, muscular endurance and strength as measured by increased METs and functional capacity (6MWT)       Able to understand and use rate of perceived exertion (RPE) scale  Yes       Intervention  Provide education and explanation on how to use RPE scale       Expected Outcomes  Short Term: Able to use RPE daily in rehab to express subjective intensity level;Long Term:  Able to use RPE to guide intensity level when exercising independently       Able to understand and use Dyspnea scale  Yes       Intervention  Provide education and explanation on how to use Dyspnea scale       Expected Outcomes  Short Term: Able to use Dyspnea scale daily in rehab to express subjective sense of shortness of breath during exertion;Long Term: Able to use Dyspnea scale to guide intensity level when exercising independently       Knowledge and understanding of Target Heart Rate Range (THRR)  Yes       Intervention  Provide education and explanation of THRR including how the numbers were predicted and where they are located for reference       Expected Outcomes  Short Term: Able to state/look up THRR;Short Term: Able to use daily as guideline for intensity in rehab;Long Term: Able to use THRR to govern intensity when exercising independently       Able to check pulse independently  Yes       Intervention  Provide education and demonstration on how to check pulse in carotid and radial arteries.;Review the importance of being able to check your own pulse for safety during independent exercise       Expected Outcomes   Short Term: Able to explain why pulse checking is important during independent exercise;Long Term: Able to check pulse independently and accurately       Understanding of Exercise Prescription  Yes       Intervention  Provide education, explanation, and written materials on patient's individual exercise prescription       Expected Outcomes  Short Term: Able to explain program exercise prescription;Long Term: Able to explain home exercise prescription to exercise independently          Exercise Goals Re-Evaluation : Exercise Goals Re-Evaluation    Row Name 04/09/18 1027 04/18/18 1450 05/02/18 1232         Exercise Goal Re-Evaluation   Exercise Goals Review  Understanding of Exercise Prescription;Able to understand and use Dyspnea scale;Knowledge and understanding of Target Heart Rate Range (THRR);Able to understand and use rate of perceived exertion (RPE) scale  Increase Physical Activity;Increase Strength and Stamina;Understanding of Exercise Prescription  Increase Physical Activity;Able to understand and use rate of perceived exertion (RPE) scale;Increase Strength and Stamina;Knowledge and understanding of Target Heart Rate Range (THRR)     Comments  Reviewed RPE scale, THR and program prescription with pt today.  Pt voiced understanding and was given a copy of goals to take home.   Stacie [name] has completed two full days of exercise.  She did have to decrease her treadmill speed to 1.3 mph.  We will continue to monitor her progression.   Stacie [name] is making small progressions with exercise.  Staff will monitor progress.     Expected  Outcomes  Short: Use RPE daily to regulate intensity.  Long: Follow program prescription in THR.  Short: Continue to attend regularly. Long: Continue to follow prescription.   Short - increase level on Biostep Long - increase overall MET level         Discharge Exercise Prescription (Final Exercise Prescription Changes): Exercise Prescription Changes - 05/02/18 1200       Response to Exercise   Blood Pressure (Admit)  110/68    Blood Pressure (Exit)  118/66    Heart Rate (Admit)  106 bpm    Heart Rate (Exercise)  106 bpm    Heart Rate (Exit)  105 bpm    Oxygen Saturation (Admit)  93 %    Oxygen Saturation (Exercise)  93 %    Oxygen Saturation (Exit)  91 %    Rating of Perceived Exertion (Exercise)  14    Perceived Dyspnea (Exercise)  3    Duration  Continue with 45 min of aerobic exercise without signs/symptoms of physical distress.    Intensity  THRR unchanged meeting RPE goals      Progression   Progression  Continue to progress workloads to maintain intensity without signs/symptoms of physical distress.    Average METs  2.25      Resistance Training   Training Prescription  Yes    Weight  2 lb    Reps  10-15      Interval Training   Interval Training  No      NuStep   Level  2    SPM  80    Minutes  15    METs  2.5      Biostep-RELP   Level  2    SPM  50    Minutes  15    METs  2       Nutrition:  Target Goals: Understanding of nutrition guidelines, daily intake of sodium <1533m, cholesterol <2025m calories 30% from fat and 7% or less from saturated fats, daily to have 5 or more servings of fruits and vegetables.  Biometrics: Pre Biometrics - 04/03/18 1259      Pre Biometrics   Height  5' 6.5" (1.689 m)    Weight  198 lb 11.2 oz (90.1 kg)    Waist Circumference  39.5 inches    Hip Circumference  47.75 inches    Waist to Hip Ratio  0.83 %    BMI (Calculated)  31.59        Nutrition Therapy Plan and Nutrition Goals: Nutrition Therapy & Goals - 05/07/18 1101      Nutrition Therapy   Diet  TLC    Protein (specify units)  8oz    Fiber  25 grams    Whole Grain Foods  3 servings    Saturated Fats  12 max. grams    Fruits and Vegetables  6 servings/day 8 ideal, currently working to increase her F&V intake    Sodium  2000 grams      Personal Nutrition Goals   Nutrition Goal  Continue to work on increasing your daily  fruit and vegetable intake, with less of a focus on starch-heavy meals- great job!    Personal Goal #2  Continue to educate yourself on foods high in sodium and added sugar. Look for foods with less than ~10g added sugar per serving and less than ~25064modium per serving    Comments  She has been working with her health-conscious daughters to make changes to her  diet in order to be healthier and lose weight. Reported wt loss of approx 12-15# to date, with an overall wt loss goal of 50#. She has been eating smaller more frequent meals and choosing more lean meats      Intervention Plan   Intervention  Prescribe, educate and counsel regarding individualized specific dietary modifications aiming towards targeted core components such as weight, hypertension, lipid management, diabetes, heart failure and other comorbidities.    Expected Outcomes  Short Term Goal: Understand basic principles of dietary content, such as calories, fat, sodium, cholesterol and nutrients.;Short Term Goal: A plan has been developed with personal nutrition goals set during dietitian appointment.;Long Term Goal: Adherence to prescribed nutrition plan.       Nutrition Assessments: Nutrition Assessments - 04/03/18 1107      MEDFICTS Scores   Pre Score  13       Nutrition Goals Re-Evaluation: Nutrition Goals Re-Evaluation    Row Name 04/27/18 1058 05/07/18 1120           Goals   Current Weight  194 lb (88 kg)  -      Nutrition Goal  Stacie [name] would like to meet with the dietician  Continue to work on increasing your daily fruit and vegetable intake, with less focus on starch-heavy meals- great job      Comment  -  Traditionally her meals were starch-centered, with little vegetables or protein. She also did not eat much fruit      Expected Outcome  Short: meet with RD on 7/22. Long: adhere to nutrition plan set up by dietician  She will work up to 1/2 her plate being vegetables/fruit, 1/4 plate protein and 1/4 plate starch,  with extra servings of fruits and vegetables throughout the day as able        Personal Goal #2 Re-Evaluation   Personal Goal #2  -  Continue to educate yourself on foods high in sodium and added sugar. Look for foods with less than ~10g added sugar per serving and less than ~219m sodium per serving         Nutrition Goals Discharge (Final Nutrition Goals Re-Evaluation): Nutrition Goals Re-Evaluation - 05/07/18 1120      Goals   Nutrition Goal  Continue to work on increasing your daily fruit and vegetable intake, with less focus on starch-heavy meals- great job    Comment  Traditionally her meals were starch-centered, with little vegetables or protein. She also did not eat much fruit    Expected Outcome  She will work up to 1/2 her plate being vegetables/fruit, 1/4 plate protein and 1/4 plate starch, with extra servings of fruits and vegetables throughout the day as able      Personal Goal #2 Re-Evaluation   Personal Goal #2  Continue to educate yourself on foods high in sodium and added sugar. Look for foods with less than ~10g added sugar per serving and less than ~2557msodium per serving       Psychosocial: Target Goals: Acknowledge presence or absence of significant depression and/or stress, maximize coping skills, provide positive support system. Participant is able to verbalize types and ability to use techniques and skills needed for reducing stress and depression.   Initial Review & Psychosocial Screening: Initial Psych Review & Screening - 04/03/18 1105      Initial Review   Current issues with  Current Stress Concerns    Source of Stress Concerns  Chronic Illness    Comments  Her health is  the only thing that is stressing her out.      Family Dynamics   Good Support System?  Yes    Comments  She can look to her oldest daughter, husband and her other daughters.      Barriers   Psychosocial barriers to participate in program  The patient should benefit from training in  stress management and relaxation.      Screening Interventions   Interventions  Encouraged to exercise;Program counselor consult;To provide support and resources with identified psychosocial needs;Provide feedback about the scores to participant    Expected Outcomes  Short Term goal: Utilizing psychosocial counselor, staff and physician to assist with identification of specific Stressors or current issues interfering with healing process. Setting desired goal for each stressor or current issue identified.;Long Term Goal: Stressors or current issues are controlled or eliminated.;Short Term goal: Identification and review with participant of any Quality of Life or Depression concerns found by scoring the questionnaire.;Long Term goal: The participant improves quality of Life and PHQ9 Scores as seen by post scores and/or verbalization of changes       Quality of Life Scores:  Scores of 19 and below usually indicate a poorer quality of life in these areas.  A difference of  2-3 points is a clinically meaningful difference.  A difference of 2-3 points in the total score of the Quality of Life Index has been associated with significant improvement in overall quality of life, self-image, physical symptoms, and general health in studies assessing change in quality of life.  PHQ-9: Recent Review Flowsheet Data    Depression screen [facility] 2/9 04/03/2018 04/02/2018 04/20/2017   Decreased Interest 3 1 0   Down, Depressed, Hopeless 1 1 0   PHQ - 2 Score 4 2 0   Altered sleeping 0 0 -   Tired, decreased energy 3 1 -   Change in appetite 1 0 -   Feeling bad or failure about yourself  2 0 -   Trouble concentrating 3 1 -   Moving slowly or fidgety/restless 0 0 -   Suicidal thoughts 0 0 -   PHQ-9 Score 13 4 -   Difficult doing work/chores Very difficult Not difficult at all -     Interpretation of Total Score  Total Score Depression Severity:  1-4 = Minimal depression, 5-9 = Mild depression, 10-14 = Moderate  depression, 15-19 = Moderately severe depression, 20-27 = Severe depression   Psychosocial Evaluation and Intervention: Psychosocial Evaluation - 04/11/18 1206      Psychosocial Evaluation & Interventions   Interventions  Encouraged to exercise with the program and follow exercise prescription    Comments  Counselor met with Ms. Carden Stacie [name]) today for initial psychosocial evaluation.  She is a 62 year old who has a strong support system with a spouse and (4) adult children.  Stacie [name] has sarcoidosis and this has taken it's toll on her energy level.  She sleeps well and has a good appetite.  Stacie [name] denies a history of depression or anxiety or any current symptoms and she reports being in a positive mood most of the time.  Stacie [name] has minimal stress in her life other than her health.  She has goals to lose some weight while in this program and increase her energy/stamina.  Staff will follow with [facility].    Expected Outcomes  Short:  Stacie [name] will exercise to increase her energy and for stress in her life.  She will meet with the dietician to address her  weight loss goal.   Long:  Cathy will exercise consistently for positive self care and stress reduction.      Continue Psychosocial Services   Follow up required by staff       Psychosocial Re-Evaluation: Psychosocial Re-Evaluation    [place] Name 04/25/18 1115 04/27/18 1059           Psychosocial Re-Evaluation   Current issues with  Current Stress Concerns  Current Stress Concerns      Comments  Counselor follow up with Stacie [name] today reporting feeling stronger and losing a little weight since coming into this program.  She states she feels frustrated at times with her health and limitations but is learning to keep focused on the positive things she is able to do for her health benefits.  Counselor commended her for progress made and positive coping strategies.    Stacie [name] is managing her chronic illness well. She finds joy in her grandchildren and doing  activities with them. She is taking it one day at a time, but is still frustrated with her illness and medication. She is ending her Prednisone and hopes to feel better after that. She enjoys coming to [place] and is looking forward to coming on a more consistent basis now that she is back from vacation      Expected Outcomes  Short:  Stacie [name] will continue practicing positive coping strategies including exercise.   Long:  Stacie [name] will learn life long skills of exercise and positive coping for her overall heatlh benefits.    Short: Stacie [name] will continue to come to [name] for exercise and stress reduction. Long: cathy will find a hobby/activity to keep her getting out of the house and staying active after [name].       Interventions  Relaxation education;Stress management education  -         Psychosocial Discharge (Final Psychosocial Re-Evaluation): Psychosocial Re-Evaluation - 04/27/18 1059      Psychosocial Re-Evaluation   Current issues with  Current Stress Concerns    Comments  Stacie [name] is managing her chronic illness well. She finds joy in her grandchildren and doing activities with them. She is taking it one day at a time, but is still frustrated with her illness and medication. She is ending her Prednisone and hopes to feel better after that. She enjoys coming to [place] and is looking forward to coming on a more consistent basis now that she is back from vacation    Expected Outcomes  Short: Stacie [name] will continue to come to [place] for exercise and stress reduction. Long: cathy will find a hobby/activity to keep her getting out of the house and staying active after [name].        Education: Education Goals: Education classes will be provided on a weekly basis, covering required topics. Participant will state understanding/return demonstration of topics presented.  Learning Barriers/Preferences: Learning Barriers/Preferences - 04/03/18 1112      Learning Barriers/Preferences    Learning Barriers  Sight reading glasses and prescription    Learning Preferences  Audio;None       Education Topics:  Initial Evaluation Education: - Verbal, written and demonstration of respiratory meds, oximetry and breathing techniques. Instruction on use of nebulizers and MDIs and importance of monitoring MDI activations.   Pulmonary Rehab from 05/07/2018 in [facility] Cardiac and Pulmonary Rehab  Date  04/03/18  Educator  [facility]  Instruction Review Code  1- Verbalizes Understanding      General Nutrition Guidelines/Fats and Fiber: -Group instruction provided by verbal,  written material, models and posters to present the general guidelines for heart healthy nutrition. Gives an explanation and review of dietary fats and fiber.   Pulmonary Rehab from 05/07/2018 in [facility] Cardiac and Pulmonary Rehab  Date  04/30/18  Educator  CR  Instruction Review Code  1- Verbalizes Understanding      Controlling Sodium/Reading Food Labels: -Group verbal and written material supporting the discussion of sodium use in heart healthy nutrition. Review and explanation with models, verbal and written materials for utilization of the food label.   Pulmonary Rehab from 05/07/2018 in [facility] Cardiac and Pulmonary Rehab  Date  05/07/18  Educator  CR  Instruction Review Code  1- Verbalizes Understanding      Exercise Physiology & General Exercise Guidelines: - Group verbal and written instruction with models to review the exercise physiology of the cardiovascular system and associated critical values. Provides general exercise guidelines with specific guidelines to those with heart or lung disease.    Aerobic Exercise & Resistance Training: - Gives group verbal and written instruction on the various components of exercise. Focuses on aerobic and resistive training programs and the benefits of this training and how to safely progress through these programs.   Pulmonary Rehab from 05/07/2018 in [facility] Cardiac and  Pulmonary Rehab  Date  05/02/18  Educator  [facility]  Instruction Review Code  1- Verbalizes Understanding      Flexibility, Balance, Mind/Body Relaxation: Provides group verbal/written instruction on the benefits of flexibility and balance training, including mind/body exercise modes such as yoga, pilates and tai chi.  Demonstration and skill practice provided.   Stress and Anxiety: - Provides group verbal and written instruction about the health risks of elevated stress and causes of high stress.  Discuss the correlation between heart/lung disease and anxiety and treatment options. Review healthy ways to manage with stress and anxiety.   Depression: - Provides group verbal and written instruction on the correlation between heart/lung disease and depressed mood, treatment options, and the stigmas associated with seeking treatment.   Pulmonary Rehab from 05/07/2018 in [facility] Cardiac and Pulmonary Rehab  Date  04/25/18  Educator  [facility]  Instruction Review Code  1- Verbalizes Understanding      Exercise & Equipment Safety: - Individual verbal instruction and demonstration of equipment use and safety with use of the equipment.   Pulmonary Rehab from 05/07/2018 in [facility] Cardiac and Pulmonary Rehab  Date  04/03/18  Educator  [facility]  Instruction Review Code  1- Verbalizes Understanding      Infection Prevention: - Provides verbal and written material to individual with discussion of infection control including proper hand washing and proper equipment cleaning during exercise session.   Pulmonary Rehab from 05/07/2018 in [facility] Cardiac and Pulmonary Rehab  Date  04/03/18  Educator  [facility]  Instruction Review Code  1- Verbalizes Understanding      Falls Prevention: - Provides verbal and written material to individual with discussion of falls prevention and safety.   Pulmonary Rehab from 05/07/2018 in [facility] Cardiac and Pulmonary Rehab  Date  04/03/18  Educator  [facility]  Instruction Review Code  1- Verbalizes  Understanding      Diabetes: - Individual verbal and written instruction to review signs/symptoms of diabetes, desired ranges of glucose level fasting, after meals and with exercise. Advice that pre and post exercise glucose checks will be done for 3 sessions at entry of program.   Chronic Lung Diseases: - Group verbal and written instruction to review updates, respiratory medications,  advancements in procedures and treatments. Discuss use of supplemental oxygen including available portable oxygen systems, continuous and intermittent flow rates, concentrators, personal use and safety guidelines. Review proper use of inhaler and spacers. Provide informative websites for self-education.    Energy Conservation: - Provide group verbal and written instruction for methods to conserve energy, plan and organize activities. Instruct on pacing techniques, use of adaptive equipment and posture/positioning to relieve shortness of breath.   Triggers and Exacerbations: - Group verbal and written instruction to review types of environmental triggers and ways to prevent exacerbations. Discuss weather changes, air quality and the benefits of nasal washing. Review warning signs and symptoms to help prevent infections. Discuss techniques for effective airway clearance, coughing, and vibrations.   AED/CPR: - Group verbal and written instruction with the use of models to demonstrate the basic use of the AED with the basic ABC's of resuscitation.   Anatomy and Physiology of the Lungs: - Group verbal and written instruction with the use of models to provide basic lung anatomy and physiology related to function, structure and complications of lung disease.   Anatomy & Physiology of the Heart: - Group verbal and written instruction and models provide basic cardiac anatomy and physiology, with the coronary electrical and arterial systems. Review of Valvular disease and Heart Failure   Cardiac Medications: -  Group verbal and written instruction to review commonly prescribed medications for heart disease. Reviews the medication, class of the drug, and side effects.   Know Your Numbers and Risk Factors: -Group verbal and written instruction about important numbers in your health.  Discussion of what are risk factors and how they play a role in the disease process.  Review of Cholesterol, Blood Pressure, Diabetes, and BMI and the role they play in your overall health.   Sleep Hygiene: -Provides group verbal and written instruction about how sleep can affect your health.  Define sleep hygiene, discuss sleep cycles and impact of sleep habits. Review good sleep hygiene tips.    Pulmonary Rehab from 05/07/2018 in [facility] Cardiac and Pulmonary Rehab  Date  04/11/18  Educator  [facility]  Instruction Review Code  1- Verbalizes Understanding      Other: -Provides group and verbal instruction on various topics (see comments)    Knowledge Questionnaire Score: Knowledge Questionnaire Score - 04/03/18 1112      Knowledge Questionnaire Score   Pre Score  16/18 reviewed        Core Components/Risk Factors/Patient Goals at Admission: Personal Goals and Risk Factors at Admission - 04/03/18 1118      Core Components/Risk Factors/Patient Goals on Admission    Weight Management  Yes;Weight Loss;Weight Maintenance    Intervention  Weight Management: Develop a combined nutrition and exercise program designed to reach desired caloric intake, while maintaining appropriate intake of nutrient and fiber, sodium and fats, and appropriate energy expenditure required for the weight goal.;Weight Management: Provide education and appropriate resources to help participant work on and attain dietary goals.;Weight Management/Obesity: Establish reasonable short term and long term weight goals.    Admit Weight  198 lb 11.2 oz (90.1 kg)    Goal Weight: Short Term  193 lb (87.5 kg)    Goal Weight: Long Term  150 lb (68 kg)     Expected Outcomes  Weight Maintenance: Understanding of the daily nutrition guidelines, which includes 25-35% calories from fat, 7% or less cal from saturated fats, less than 237m cholesterol, less than 1.5gm of sodium, & 5 or more servings of  fruits and vegetables daily;Short Term: Continue to assess and modify interventions until short term weight is achieved;Long Term: Adherence to nutrition and physical activity/exercise program aimed toward attainment of established weight goal;Weight Loss: Understanding of general recommendations for a balanced deficit meal plan, which promotes 1-2 lb weight loss per week and includes a negative energy balance of [phone] kcal/d;Understanding recommendations for meals to include 15-35% energy as protein, 25-35% energy from fat, 35-60% energy from carbohydrates, less than 260m of dietary cholesterol, 20-35 gm of total fiber daily;Understanding of distribution of calorie intake throughout the day with the consumption of 4-5 meals/snacks    Improve shortness of breath with ADL's  Yes    Intervention  Provide education, individualized exercise plan and daily activity instruction to help decrease symptoms of SOB with activities of daily living.    Expected Outcomes  Short Term: Improve cardiorespiratory fitness to achieve a reduction of symptoms when performing ADLs;Long Term: Be able to perform more ADLs without symptoms or delay the onset of symptoms    Hypertension  Yes    Intervention  Provide education on lifestyle modifcations including regular physical activity/exercise, weight management, moderate sodium restriction and increased consumption of fresh fruit, vegetables, and low fat dairy, alcohol moderation, and smoking cessation.;Monitor prescription use compliance.    Expected Outcomes  Short Term: Continued assessment and intervention until BP is < 140/931mHG in hypertensive participants. < 130/8032mG in hypertensive participants with diabetes, heart failure or  chronic kidney disease.;Long Term: Maintenance of blood pressure at goal levels.       Core Components/Risk Factors/Patient Goals Review:  Goals and Risk Factor Review    Row Name 04/27/18 1048             Core Components/Risk Factors/Patient Goals Review   Personal Goals Review  Weight Management/Obesity;Improve shortness of breath with ADL's;Hypertension       Review  Cathy's weight is still fluctuating related to Prednisone. She is coming off of another round and hopes to be able to get back on track. Her blood pressure has been stable. She has noticed an improvement with her SOB already since starting [name]. She reported being able to walk up her stairs at home with little difficulty       Expected Outcomes  Short: CatTye [place] continue to work on her SOB by attending exercise and work on her goal weight. Long: reach goal weight of 150lb through exercise and healthy eating.           Core Components/Risk Factors/Patient Goals at Discharge (Final Review):  Goals and Risk Factor Review - 04/27/18 1048      Core Components/Risk Factors/Patient Goals Review   Personal Goals Review  Weight Management/Obesity;Improve shortness of breath with ADL's;Hypertension    Review  Cathy's weight is still fluctuating related to Prednisone. She is coming off of another round and hopes to be able to get back on track. Her blood pressure has been stable. She has noticed an improvement with her SOB already since starting [name]. She reported being able to walk up her stairs at home with little difficulty    Expected Outcomes  Short: CatTye [place] continue to work on her SOB by attending exercise and work on her goal weight. Long: reach goal weight of 150lb through exercise and healthy eating.        ITP Comments: ITP Comments    Row Name 04/03/18 1038 04/16/18 0945 05/14/18 0844       ITP Comments  Medical Evaluation completed.  Chart sent for review and changes to Dr. Caryl Comes for Dr. Emily Filbert  Director of [place]. Diagnosis can be found in [facility] encounter 03/22/18  30 day review completed. ITP sent to Dr. Emily Filbert Director of [place]. Continue with ITP unless changes are made by physician  30 day review completed. ITP sent to Dr. Emily Filbert Director of [place]. Continue with ITP unless changes are made by physician        Comments: 30 day review

## 2018-05-14 NOTE — Progress Notes (Signed)
Daily Session Note  Patient Details  Name: TIESHIA RETTINGER MRN: [id number] Date of Birth: 08-May-1956 Referring Provider:     Pulmonary Rehab from 04/03/2018 in [facility] Cardiac and Pulmonary Rehab  Referring Provider  [place]      Encounter Date: 05/14/2018  Check In: Session Check In - 05/14/18 1016      Check-In   Supervising physician immediately available to respond to emergencies  [name] immediately available ER MD    Physician(s)  Dr. Cinda Quest and Dr. Archie Balboa    Location  [facility]-Cardiac & Pulmonary Rehab    Staff Present  Nada Maclachlan, BA, ACSM CEP, Exercise Physiologist;Rustin Erhart Amedeo Plenty, BS, ACSM CEP, Exercise Physiologist;Joseph Tessie Fass RCP,RRT,BSRT    Medication changes reported      No    Fall or balance concerns reported     No    Tobacco Cessation  No Change    Warm-up and Cool-down  Performed as group-led instruction    Resistance Training Performed  Yes    VAD Patient?  No    PAD/SET Patient?  No      Pain Assessment   Currently in Pain?  No/denies    Multiple Pain Sites  No          Social History   Tobacco Use  Smoking Status Never Smoker  Smokeless Tobacco Never Used    Goals Met:  Proper associated with RPD/PD & O2 Sat Independence with exercise equipment Exercise tolerated well No report of cardiac concerns or symptoms Strength training completed today  Goals Unmet:  Not Applicable  Comments: Pt able to follow exercise prescription today without complaint.  Will continue to monitor for progression.    Dr. Emily Filbert is Medical Director for [place] and [name].

## 2018-05-16 DIAGNOSIS — D860 Sarcoidosis of lung: Secondary | ICD-10-CM | POA: Diagnosis not present

## 2018-05-16 DIAGNOSIS — R0602 Shortness of breath: Secondary | ICD-10-CM | POA: Diagnosis not present

## 2018-05-16 NOTE — Progress Notes (Signed)
Daily Session Note  Patient Details  Name: Stacie [name] MRN: [id number] Date of Birth: 05/29/1956 Referring Provider:     Pulmonary Rehab from 04/03/2018 in [facility] Cardiac and Pulmonary Rehab  Referring Provider  [place]      Encounter Date: 05/16/2018  Check In: Session Check In - 05/16/18 1016      Check-In   Supervising physician immediately available to respond to emergencies  [name] immediately available ER MD    Physician(s)  Cinda Quest and Quentin Cornwall    Location  [facility]-Cardiac & Pulmonary Rehab    Staff Present  Justin Mend Lorre Nick, MA, RCEP, CCRP, Exercise Physiologist;Amanda Oletta Darter, [place], ACSM CEP, Exercise Physiologist    Medication changes reported      No    Fall or balance concerns reported     No    Warm-up and Cool-down  Performed as group-led instruction    Resistance Training Performed  Yes    VAD Patient?  No    PAD/SET Patient?  No      Pain Assessment   Currently in Pain?  No/denies    Multiple Pain Sites  No          Social History   Tobacco Use  Smoking Status Never Smoker  Smokeless Tobacco Never Used    Goals Met:  Proper associated with RPD/PD & O2 Sat Independence with exercise equipment Exercise tolerated well Strength training completed today  Goals Unmet:  Not Applicable  Comments: Pt able to follow exercise prescription today without complaint.  Will continue to monitor for progression.    Dr. Emily Filbert is Medical Director for [place] and [name].

## 2018-05-18 ENCOUNTER — Encounter: Payer: BLUE CROSS/BLUE SHIELD | Attending: Internal Medicine

## 2018-05-18 DIAGNOSIS — D860 Sarcoidosis of lung: Secondary | ICD-10-CM | POA: Insufficient documentation

## 2018-05-18 DIAGNOSIS — R0602 Shortness of breath: Secondary | ICD-10-CM | POA: Insufficient documentation

## 2018-05-24 DIAGNOSIS — G4733 Obstructive sleep apnea (adult) (pediatric): Secondary | ICD-10-CM | POA: Diagnosis not present

## 2018-05-28 ENCOUNTER — Encounter: Payer: BLUE CROSS/BLUE SHIELD | Admitting: *Deleted

## 2018-05-28 DIAGNOSIS — D860 Sarcoidosis of lung: Secondary | ICD-10-CM | POA: Diagnosis not present

## 2018-05-28 DIAGNOSIS — R0602 Shortness of breath: Secondary | ICD-10-CM | POA: Diagnosis not present

## 2018-05-28 NOTE — Progress Notes (Signed)
Daily Session Note  Patient Details  Name: Stacie [name] MRN: [id number] Date of Birth: [birth year] Referring Provider:     Pulmonary Rehab from 04/03/2018 in [facility] Cardiac and Pulmonary Rehab  Referring Provider  [place]      Encounter Date: 05/28/2018  Check In: Session Check In - 05/28/18 1020      Check-In   Supervising physician immediately available to respond to emergencies  [name] immediately available ER MD    Physician(s)  Dr. Quentin Cornwall and Dr. Corky Downs    Location  [facility]-Cardiac & Pulmonary Rehab    Staff Present  Joellyn Rued, BS, PEC;Nahdia Doucet [place], BS, ACSM CEP, Exercise Physiologist;Amanda Oletta Darter, [place], ACSM CEP, Exercise Physiologist    Medication changes reported      No    Fall or balance concerns reported     No    Tobacco Cessation  No Change    Warm-up and Cool-down  Performed as group-led instruction    Resistance Training Performed  Yes    VAD Patient?  No    PAD/SET Patient?  No      Pain Assessment   Currently in Pain?  No/denies    Multiple Pain Sites  No          Social History   Tobacco Use  Smoking Status Never Smoker  Smokeless Tobacco Never Used    Goals Met:  Proper associated with RPD/PD & O2 Sat Independence with exercise equipment Exercise tolerated well No report of cardiac concerns or symptoms Strength training completed today  Goals Unmet:  Not Applicable  Comments: Pt able to follow exercise prescription today without complaint.  Will continue to monitor for progression.    Dr. Emily Filbert is Medical Director for [place] and [name].

## 2018-05-30 DIAGNOSIS — D860 Sarcoidosis of lung: Secondary | ICD-10-CM

## 2018-05-30 DIAGNOSIS — R0602 Shortness of breath: Secondary | ICD-10-CM | POA: Diagnosis not present

## 2018-05-30 NOTE — Progress Notes (Signed)
Daily Session Note  Patient Details  Name: CASSANDRA HARBOLD MRN: [id number] Date of Birth: 20-Jan-1956 Referring Provider:     Pulmonary Rehab from 04/03/2018 in [facility] Cardiac and Pulmonary Rehab  Referring Provider  [place]      Encounter Date: 05/30/2018  Check In: Session Check In - 05/30/18 1150      Check-In   Supervising physician immediately available to respond to emergencies  [name] immediately available ER MD    Physician(s)  Cinda Quest and Jimmye Norman    Location  [facility]-Cardiac & Pulmonary Rehab    Staff Present  Nyoka Cowden, RN, BSN, [place], BS, RRT, Respiratory Lennie Hummer, MA, RCEP, CCRP, Exercise Physiologist;Glorious Flicker Oletta Darter, BA, ACSM CEP, Exercise Physiologist          Social History   Tobacco Use  Smoking Status Never Smoker  Smokeless Tobacco Never Used    Goals Met:  Proper associated with RPD/PD & O2 Sat Independence with exercise equipment Exercise tolerated well Personal goals reviewed  Goals Unmet:  Not Applicable  Comments: Pt able to follow exercise prescription today without complaint.  Will continue to monitor for progression.    Dr. Emily Filbert is Medical Director for [place] and [name]monary [facility].

## 2018-06-01 ENCOUNTER — Encounter: Payer: BLUE CROSS/BLUE SHIELD | Admitting: *Deleted

## 2018-06-01 DIAGNOSIS — R0602 Shortness of breath: Secondary | ICD-10-CM | POA: Diagnosis not present

## 2018-06-01 DIAGNOSIS — D860 Sarcoidosis of lung: Secondary | ICD-10-CM

## 2018-06-01 NOTE — Progress Notes (Signed)
Daily Session Note  Patient Details  Name: Stacie [name] MRN: [id number] Date of Birth: 1956-06-16 Referring Provider:     Pulmonary Rehab from 04/03/2018 in [facility]  Referring Provider  [place]      Encounter Date: 06/01/2018  Check In:      Social History   Tobacco Use  Smoking Status Never Smoker  Smokeless Tobacco Never Used    Goals Met:  Proper associated with RPD/PD & O2 Sat Independence with exercise equipment Using PLB without cueing & demonstrates good technique Exercise tolerated well No report of cardiac concerns or symptoms Strength training completed today  Goals Unmet:  Not Applicable  Comments: Pt able to follow exercise prescription today without complaint.  Will continue to monitor for progression.    Dr. Emily Filbert is Medical Director for [place] and [name].

## 2018-06-04 DIAGNOSIS — D860 Sarcoidosis of lung: Secondary | ICD-10-CM

## 2018-06-04 DIAGNOSIS — R0602 Shortness of breath: Secondary | ICD-10-CM | POA: Diagnosis not present

## 2018-06-04 NOTE — Progress Notes (Signed)
Daily Session Note  Patient Details  Name: LESHAE MCCLAY MRN: [id number] Date of Birth: [birth year] Referring Provider:     Pulmonary Rehab from 04/03/2018 in [facility]  Referring Provider  [place]      Encounter Date: 06/04/2018  Check In: Session Check In - 06/04/18 1010      Check-In   Supervising physician immediately available to respond to emergencies  [name] immediately available ER MD    Physician(s)  Dr. Corky Downs and Dr. Kerman Passey    Location  [facility] & Pulmonary Rehab    Staff Present  Earlean Shawl, BS, ACSM CEP, Exercise Physiologist;[facility], [place], ACSM CEP, Exercise Physiologist    Medication changes reported      No    Fall or balance concerns reported     No    Tobacco Cessation  No Change    Warm-up and Cool-down  Performed as group-led instruction    Resistance Training Performed  Yes    VAD Patient?  No    PAD/SET Patient?  No      Pain Assessment   Currently in Pain?  No/denies    Multiple Pain Sites  No          Social History   Tobacco Use  Smoking Status Never Smoker  Smokeless Tobacco Never Used    Goals Met:  Proper associated with RPD/PD & O2 Sat Independence with exercise equipment Exercise tolerated well No report of cardiac concerns or symptoms Strength training completed today  Goals Unmet:  Not Applicable  Comments: Pt able to follow exercise prescription today without complaint.  Will continue to monitor for progression.    Dr. Emily Filbert is Medical Director for [place] and [name].

## 2018-06-05 ENCOUNTER — Other Ambulatory Visit: Payer: Self-pay | Admitting: Internal Medicine

## 2018-06-05 ENCOUNTER — Encounter: Payer: Self-pay | Admitting: Internal Medicine

## 2018-06-05 DIAGNOSIS — F39 Unspecified mood [affective] disorder: Secondary | ICD-10-CM

## 2018-06-07 ENCOUNTER — Other Ambulatory Visit: Payer: Self-pay | Admitting: Internal Medicine

## 2018-06-07 DIAGNOSIS — Z1231 Encounter for screening mammogram for malignant neoplasm of breast: Secondary | ICD-10-CM

## 2018-06-08 DIAGNOSIS — D860 Sarcoidosis of lung: Secondary | ICD-10-CM

## 2018-06-08 DIAGNOSIS — R0602 Shortness of breath: Secondary | ICD-10-CM | POA: Diagnosis not present

## 2018-06-08 NOTE — Progress Notes (Signed)
Daily Session Note  Patient Details  Name: Stacie [name] MRN: [id number] Date of Birth: 1956/10/03 Referring Provider:     Pulmonary Rehab from 04/03/2018 in [facility] Re[facility]  Referring Provider  [place]      Encounter Date: 06/08/2018  Check In:      Social History   Tobacco Use  Smoking Status Never Smoker  Smokeless Tobacco Never Used    Goals Met:  Independence with exercise equipment Exercise tolerated well No report of cardiac concerns or symptoms Strength training completed today  Goals Unmet:  Not Applicable  Comments: Pt able to follow exercise prescription today without complaint.  Will continue to monitor for progression.   Dr. Emily Filbert is Medical Director for [place] and [name].

## 2018-06-11 ENCOUNTER — Encounter: Payer: BLUE CROSS/BLUE SHIELD | Admitting: *Deleted

## 2018-06-11 DIAGNOSIS — D860 Sarcoidosis of lung: Secondary | ICD-10-CM

## 2018-06-11 DIAGNOSIS — R0602 Shortness of breath: Secondary | ICD-10-CM | POA: Diagnosis not present

## 2018-06-11 NOTE — Progress Notes (Signed)
Pulmonary Individual Treatment Plan  Patient Details  Name: Stacie [name] MRN: [id number] Date of Birth: 1955-11-12 Referring Provider:     Pulmonary Rehab from 04/03/2018 in [facility]  Referring Provider  [place]      Initial Encounter Date:    Pulmonary Rehab from 04/03/2018 in [facility]  Date  04/03/18      Visit Diagnosis: Pulmonary sarcoidosis ([place])  Patient's Home Medications on Admission:  Current Outpatient Medications:  .  Acetaminophen-Codeine (TYLENOL/CODEINE #3) 300-30 MG tablet, Take 1 tablet by mouth every 4 (four) hours as needed for pain., Disp: 42 tablet, Rfl: 0 .  albuterol (VENTOLIN HFA) 108 (90 Base) MCG/ACT inhaler, Inhale 2 puffs into the lungs every 6 (six) hours as needed for wheezing., Disp: 1 Inhaler, Rfl: 2 .  amLODipine (NORVASC) 10 MG tablet, Take 10 mg by mouth daily., Disp: , Rfl: 11 .  budesonide-formoterol (SYMBICORT) 80-4.5 MCG/ACT inhaler, Take 2 puffs first thing in am and then another 2 puffs about 12 hours later., Disp: 1 Inhaler, Rfl: 11 .  cetirizine (ZYRTEC) 10 MG tablet, Take 10 mg by mouth daily as needed. , Disp: , Rfl:  .  escitalopram (LEXAPRO) 10 MG tablet, TAKE 1 TABLET BY MOUTH EVERY DAY, Disp: 90 tablet, Rfl: 1 .  FORTEO 600 MCG/2.4ML SOLN, , Disp: , Rfl:  .  GLUCOSAMINE PO, Take 1 tablet by mouth daily., Disp: , Rfl:  .  HYDROcodone-acetaminophen (NORCO/VICODIN) 5-325 MG tablet, Take 1 tablet by mouth every 4 (four) hours as needed., Disp: , Rfl: 0 .  mometasone (NASONEX) 50 MCG/ACT nasal spray, Place 1 spray into the nose 2 (two) times daily., Disp: , Rfl:  .  Multiple Vitamin (MULTIVITAMIN) capsule, Take 1 capsule by mouth 2 (two) times daily. , Disp: , Rfl:  .  NON FORMULARY, CPAP nightly, Disp: , Rfl:  .  predniSONE (DELTASONE) 10 MG tablet, Take 7.5 mg by mouth. , Disp: , Rfl:  .  Respiratory Therapy Supplies (FLUTTER) DEVI, Use as directed, Disp: 1 each, Rfl: 0 .  SYNTHROID 137  MCG tablet, TAKE 1 TABLET (137 MCG TOTAL) BY MOUTH DAILY BEFORE BREAKFAST., Disp: 90 tablet, Rfl: 1  Past Medical History: Past Medical History:  Diagnosis Date  . Arrhythmia    patient unaware if this is current  . Asthma   . HOH (hard of hearing)    wear aids  . Hypothyroidism   . IBS (irritable bowel syndrome)   . Sarcoid   . Sarcoidosis   . Seasonal allergies   . Sleep apnea CPAP with O2    Tobacco Use: Social History   Tobacco Use  Smoking Status Never Smoker  Smokeless Tobacco Never Used    Labs: Recent Review Scientist, physiological    Labs for ITP Cardiac and Pulmonary Rehab Latest Ref Rng & Units 04/20/2017   Cholestrol 100 - 199 mg/dL 230(H)   LDLCALC 0 - 99 mg/dL 127(H)   HDL >39 mg/dL 54   Trlycerides 0 - 149 mg/dL 246(H)   Hemoglobin A1c 4.8 - 5.6 % 6.0(H)       Pulmonary Assessment Scores: Pulmonary Assessment Scores    Row Name 04/03/18 1108         ADL UCSD   ADL Phase  Entry     SOB Score total  80     Rest  2     Walk  3     Stairs  5     Bath  4     Dress  3     Shop  4       CAT Score   CAT Score  22       mMRC Score   mMRC Score  3        Pulmonary Function Assessment: Pulmonary Function Assessment - 04/03/18 1112      Breath   Bilateral Breath Sounds  Wheezes;Decreased   slight wheezes in LLL   Shortness of Breath  Yes;Limiting activity       Exercise Target Goals: Exercise Program Goal: Individual exercise prescription set using results from initial 6 min walk test and THRR while considering  patient's activity barriers and safety.   Exercise Prescription Goal: Initial exercise prescription builds to 30-45 minutes a day of aerobic activity, 2-3 days per week.  Home exercise guidelines will be given to patient during program as part of exercise prescription that the participant will acknowledge.  Activity Barriers & Risk Stratification:   6 Minute Walk: 6 Minute Walk    Row Name 04/03/18 1301         6 Minute Walk    Distance  786 feet     Walk Time  4.9 minutes     # of Rest Breaks  1     MPH  1.82     METS  2.96     RPE  18     Perceived Dyspnea   3     VO2 Peak  10.4     Symptoms  No     Resting HR  121 bpm     Resting BP  128/66     Resting Oxygen Saturation   92 %     Exercise Oxygen Saturation  during 6 min walk  82 %     Max Ex. HR  133 bpm     Max Ex. BP  176/72     2 Minute Post BP  140/76       Interval HR   1 Minute HR  124     2 Minute HR  124     3 Minute HR  126     4 Minute HR  129     5 Minute HR  133     6 Minute HR  130     2 Minute Post HR  123     Interval Heart Rate?  Yes       Interval Oxygen   Interval Oxygen?  Yes     Baseline Oxygen Saturation %  92 %     1 Minute Oxygen Saturation %  93 %     1 Minute Liters of Oxygen  0 L     2 Minute Oxygen Saturation %  89 %     2 Minute Liters of Oxygen  0 L     3 Minute Oxygen Saturation %  85 %     3 Minute Liters of Oxygen  0 L     4 Minute Oxygen Saturation %  82 %     4 Minute Liters of Oxygen  0 L     5 Minute Oxygen Saturation %  86 %     5 Minute Liters of Oxygen  0 L     6 Minute Oxygen Saturation %  86 %     6 Minute Liters of Oxygen  0 L     2 Minute Post Oxygen Saturation %  93 %  Oxygen Initial Assessment: Oxygen Initial Assessment - 04/03/18 1115      Home Oxygen   Home Oxygen Device  Portable Concentrator    Sleep Oxygen Prescription  CPAP    Liters per minute  2    Home Exercise Oxygen Prescription  None    Home at Rest Exercise Oxygen Prescription  None    Compliance with Home Oxygen Use  Yes      Initial 6 min Walk   Oxygen Used  None      Program Oxygen Prescription   Program Oxygen Prescription  None      Intervention   Short Term Goals  To learn and exhibit compliance with exercise, home and travel O2 prescription;To learn and understand importance of maintaining oxygen saturations>88%;To learn and demonstrate proper use of respiratory medications;To learn and demonstrate proper  pursed lip breathing techniques or other breathing techniques.;To learn and understand importance of monitoring SPO2 with pulse oximeter and demonstrate accurate use of the pulse oximeter.    Long  Term Goals  Exhibits compliance with exercise, home and travel O2 prescription;Verbalizes importance of monitoring SPO2 with pulse oximeter and return demonstration;Maintenance of O2 saturations>88%;Exhibits proper breathing techniques, such as pursed lip breathing or other method taught during program session;Compliance with respiratory medication;Demonstrates proper use of MDI's       Oxygen Re-Evaluation: Oxygen Re-Evaluation    Row Name 04/09/18 1027             Program Oxygen Prescription   Program Oxygen Prescription  None         Home Oxygen   Home Oxygen Device  Portable Concentrator       Sleep Oxygen Prescription  CPAP       Liters per minute  2       Home Exercise Oxygen Prescription  None       Home at Rest Exercise Oxygen Prescription  None       Compliance with Home Oxygen Use  Yes         Goals/Expected Outcomes   Short Term Goals  To learn and exhibit compliance with exercise, home and travel O2 prescription;To learn and understand importance of maintaining oxygen saturations>88%;To learn and demonstrate proper use of respiratory medications;To learn and demonstrate proper pursed lip breathing techniques or other breathing techniques.;To learn and understand importance of monitoring SPO2 with pulse oximeter and demonstrate accurate use of the pulse oximeter.       Long  Term Goals  Exhibits compliance with exercise, home and travel O2 prescription;Verbalizes importance of monitoring SPO2 with pulse oximeter and return demonstration;Maintenance of O2 saturations>88%;Exhibits proper breathing techniques, such as pursed lip breathing or other method taught during program session;Compliance with respiratory medication;Demonstrates proper use of MDI's       Comments  Reviewed PLB  technique with pt.  Talked about how it work and it's important to maintaining his exercise saturations.         Goals/Expected Outcomes  Short: Become more profiecient at using PLB.   Long: Become independent at using PLB.          Oxygen Discharge (Final Oxygen Re-Evaluation): Oxygen Re-Evaluation - 04/09/18 1027      Program Oxygen Prescription   Program Oxygen Prescription  None      Home Oxygen   Home Oxygen Device  Portable Concentrator    Sleep Oxygen Prescription  CPAP    Liters per minute  2    Home Exercise Oxygen Prescription  None  Home at Rest Exercise Oxygen Prescription  None    Compliance with Home Oxygen Use  Yes      Goals/Expected Outcomes   Short Term Goals  To learn and exhibit compliance with exercise, home and travel O2 prescription;To learn and understand importance of maintaining oxygen saturations>88%;To learn and demonstrate proper use of respiratory medications;To learn and demonstrate proper pursed lip breathing techniques or other breathing techniques.;To learn and understand importance of monitoring SPO2 with pulse oximeter and demonstrate accurate use of the pulse oximeter.    Long  Term Goals  Exhibits compliance with exercise, home and travel O2 prescription;Verbalizes importance of monitoring SPO2 with pulse oximeter and return demonstration;Maintenance of O2 saturations>88%;Exhibits proper breathing techniques, such as pursed lip breathing or other method taught during program session;Compliance with respiratory medication;Demonstrates proper use of MDI's    Comments  Reviewed PLB technique with pt.  Talked about how it work and it's important to maintaining his exercise saturations.      Goals/Expected Outcomes  Short: Become more profiecient at using PLB.   Long: Become independent at using PLB.       Initial Exercise Prescription: Initial Exercise Prescription - 04/03/18 1300      Date of Initial Exercise RX and Referring Provider   Date   04/03/18    Referring Provider  Rackley      Treadmill   MPH  1.5    Grade  0    Minutes  15    METs  2.15      NuStep   Level  2    SPM  80    Minutes  15    METs  2.1      Biostep-RELP   Level  2    SPM  50    Minutes  15    METs  2      Prescription Details   Frequency (times per week)  3    Duration  Progress to 45 minutes of aerobic exercise without signs/symptoms of physical distress      Intensity   THRR 40-80% of Max Heartrate  135-150    Ratings of Perceived Exertion  11-15    Perceived Dyspnea  0-4      Resistance Training   Training Prescription  Yes    Weight  3 lb    Reps  10-15       Perform Capillary Blood Glucose checks as needed.  Exercise Prescription Changes: Exercise Prescription Changes    Row Name 04/18/18 1400 05/02/18 1200 05/16/18 1500 05/30/18 1200       Response to Exercise   Blood Pressure (Admit)  126/62  110/68  140/60  138/78    Blood Pressure (Exercise)  134/84  -  -  -    Blood Pressure (Exit)  118/60  118/66  128/62  118/70    Heart Rate (Admit)  107 bpm  106 bpm  110 bpm  106 bpm    Heart Rate (Exercise)  122 bpm  106 bpm  125 bpm  117 bpm    Heart Rate (Exit)  108 bpm  105 bpm  112 bpm  110 bpm    Oxygen Saturation (Admit)  91 %  93 %  89 %  93 %    Oxygen Saturation (Exercise)  88 %  93 %  82 % up to 88 with PLB  90 %    Oxygen Saturation (Exit)  92 %  91 %  88 %  93 %  Rating of Perceived Exertion (Exercise)  _0 Perceived Dyspnea (Exercise)  _1 Symptoms  SOB  -  -  -    Comments  second full day of exercise  -  -  -    Duration  Progress to 45 minutes of aerobic exercise without signs/symptoms of physical distress  Continue with 45 min of aerobic exercise without signs/symptoms of physical distress.  Continue with 45 min of aerobic exercise without signs/symptoms of physical distress.  Continue with 45 min of aerobic exercise without signs/symptoms of physical distress.    Intensity  THRR  unchanged  THRR unchanged meeting RPE goals  THRR unchanged  THRR unchanged      Progression   Progression  Continue to progress workloads to maintain intensity without signs/symptoms of physical distress.  Continue to progress workloads to maintain intensity without signs/symptoms of physical distress.  Continue to progress workloads to maintain intensity without signs/symptoms of physical distress.  Continue to progress workloads to maintain intensity without signs/symptoms of physical distress.    Average METs  1.77  2.25  2.25  2.38      Resistance Training   Training Prescription  Yes  Yes  Yes  Yes    Weight  3 lb  2 lb  2 lb  2 lb    Reps  10-15  10-15  10-15  10-15      Interval Training   Interval Training  No  No  No  No      Treadmill   MPH  1.3  -  1.5  1.5    Grade  0  -  0  0    Minutes  15  -  15  15    METs  2  -  2.15  2.15      NuStep   Level  _2 -    SPM  84  80  80  -    Minutes  _3 -    METs  2.3  2.5  2.4  -      Biostep-RELP   Level  _4 SPM  50  50  50  50    Minutes  _5 METs  _6 Exercise Comments: Exercise Comments    Row Name 04/09/18 1027           Exercise Comments  First full day of exercise!  Patient was oriented to gym and equipment including functions, settings, policies, and procedures.  Patient's individual exercise prescription and treatment plan were reviewed.  All starting workloads were established based on the results of the 6 minute walk test done at initial orientation visit.  The plan for exercise progression was also introduced and progression will be customized based on patient's performance and goals.          Exercise Goals and Review: Exercise Goals    Row Name 04/03/18 1300             Exercise Goals   Increase Physical Activity  Yes       Intervention  Provide advice, education, support and counseling about physical activity/exercise needs.;Develop an  individualized exercise prescription for aerobic and resistive  training based on initial evaluation findings, risk stratification, comorbidities and participant's personal goals.       Expected Outcomes  Short Term: Attend rehab on a regular basis to increase amount of physical activity.;Long Term: Add in home exercise to make exercise part of routine and to increase amount of physical activity.;Long Term: Exercising regularly at least 3-5 days a week.       Increase Strength and Stamina  Yes       Intervention  Provide advice, education, support and counseling about physical activity/exercise needs.;Develop an individualized exercise prescription for aerobic and resistive training based on initial evaluation findings, risk stratification, comorbidities and participant's personal goals.       Expected Outcomes  Short Term: Increase workloads from initial exercise prescription for resistance, speed, and METs.;Short Term: Perform resistance training exercises routinely during rehab and add in resistance training at home;Long Term: Improve cardiorespiratory fitness, muscular endurance and strength as measured by increased METs and functional capacity (6MWT)       Able to understand and use rate of perceived exertion (RPE) scale  Yes       Intervention  Provide education and explanation on how to use RPE scale       Expected Outcomes  Short Term: Able to use RPE daily in rehab to express subjective intensity level;Long Term:  Able to use RPE to guide intensity level when exercising independently       Able to understand and use Dyspnea scale  Yes       Intervention  Provide education and explanation on how to use Dyspnea scale       Expected Outcomes  Short Term: Able to use Dyspnea scale daily in rehab to express subjective sense of shortness of breath during exertion;Long Term: Able to use Dyspnea scale to guide intensity level when exercising independently       Knowledge and understanding of Target Heart  Rate Range (THRR)  Yes       Intervention  Provide education and explanation of THRR including how the numbers were predicted and where they are located for reference       Expected Outcomes  Short Term: Able to state/look up THRR;Short Term: Able to use daily as guideline for intensity in rehab;Long Term: Able to use THRR to govern intensity when exercising independently       Able to check pulse independently  Yes       Intervention  Provide education and demonstration on how to check pulse in carotid and radial arteries.;Review the importance of being able to check your own pulse for safety during independent exercise       Expected Outcomes  Short Term: Able to explain why pulse checking is important during independent exercise;Long Term: Able to check pulse independently and accurately       Understanding of Exercise Prescription  Yes       Intervention  Provide education, explanation, and written materials on patient's individual exercise prescription       Expected Outcomes  Short Term: Able to explain program exercise prescription;Long Term: Able to explain home exercise prescription to exercise independently          Exercise Goals Re-Evaluation : Exercise Goals Re-Evaluation    Row Name 04/09/18 1027 04/18/18 1450 05/02/18 1232 05/16/18 1508       Exercise Goal Re-Evaluation   Exercise Goals Review  Understanding of Exercise Prescription;Able to understand and use Dyspnea scale;Knowledge and understanding of Target Heart Rate Range (THRR);Able to  understand and use rate of perceived exertion (RPE) scale  Increase Physical Activity;Increase Strength and Stamina;Understanding of Exercise Prescription  Increase Physical Activity;Able to understand and use rate of perceived exertion (RPE) scale;Increase Strength and Stamina;Knowledge and understanding of Target Heart Rate Range (THRR)  Increase Physical Activity;Able to understand and use rate of perceived exertion (RPE) scale;Increase  Strength and Stamina;Able to understand and use Dyspnea scale    Comments  Reviewed RPE scale, THR and program prescription with pt today.  Pt voiced understanding and was given a copy of goals to take home.   Stacie [name] has completed two full days of exercise.  She did have to decrease her treadmill speed to 1.3 mph.  We will continue to monitor her progression.   Stacie [name] is making small progressions with exercise.  Staff will monitor progress.  Stacie [name] had added speed to TM and increased overall MET level.  Staff will monitor progress.    Expected Outcomes  Short: Use RPE daily to regulate intensity.  Long: Follow program prescription in THR.  Short: Continue to attend regularly. Long: Continue to follow prescription.   Short - increase level on Biostep Long - increase overall MET level   Short - increase weight strength training  Long - increase MET level        Discharge Exercise Prescription (Final Exercise Prescription Changes): Exercise Prescription Changes - 05/30/18 1200      Response to Exercise   Blood Pressure (Admit)  138/78    Blood Pressure (Exit)  118/70    Heart Rate (Admit)  106 bpm    Heart Rate (Exercise)  117 bpm    Heart Rate (Exit)  110 bpm    Oxygen Saturation (Admit)  93 %    Oxygen Saturation (Exercise)  90 %    Oxygen Saturation (Exit)  93 %    Rating of Perceived Exertion (Exercise)  11    Perceived Dyspnea (Exercise)  2    Duration  Continue with 45 min of aerobic exercise without signs/symptoms of physical distress.    Intensity  THRR unchanged      Progression   Progression  Continue to progress workloads to maintain intensity without signs/symptoms of physical distress.    Average METs  2.38      Resistance Training   Training Prescription  Yes    Weight  2 lb    Reps  10-15      Interval Training   Interval Training  No      Treadmill   MPH  1.5    Grade  0    Minutes  15    METs  2.15      Biostep-RELP   Level  2    SPM  50    Minutes  15    METs   2       Nutrition:  Target Goals: Understanding of nutrition guidelines, daily intake of sodium <1562m, cholesterol <207m calories 30% from fat and 7% or less from saturated fats, daily to have 5 or more servings of fruits and vegetables.  Biometrics: Pre Biometrics - 04/03/18 1259      Pre Biometrics   Height  5' 6.5" (1.689 m)    Weight  198 lb 11.2 oz (90.1 kg)    Waist Circumference  39.5 inches    Hip Circumference  47.75 inches    Waist to Hip Ratio  0.83 %    BMI (Calculated)  31.59        Nutrition  Therapy Plan and Nutrition Goals: Nutrition Therapy & Goals - 05/07/18 1101      Nutrition Therapy   Diet  TLC    Protein (specify units)  8oz    Fiber  25 grams    Whole Grain Foods  3 servings    Saturated Fats  12 max. grams    Fruits and Vegetables  6 servings/day   8 ideal, currently working to increase her F&V intake   Sodium  2000 grams      Personal Nutrition Goals   Nutrition Goal  Continue to work on increasing your daily fruit and vegetable intake, with less of a focus on starch-heavy meals- great job!    Personal Goal #2  Continue to educate yourself on foods high in sodium and added sugar. Look for foods with less than ~10g added sugar per serving and less than ~244m sodium per serving    Comments  She has been working with her health-conscious daughters to make changes to her diet in order to be healthier and lose weight. Reported wt loss of approx 12-15# to date, with an overall wt loss goal of 50#. She has been eating smaller more frequent meals and choosing more lean meats      Intervention Plan   Intervention  Prescribe, educate and counsel regarding individualized specific dietary modifications aiming towards targeted core components such as weight, hypertension, lipid management, diabetes, heart failure and other comorbidities.    Expected Outcomes  Short Term Goal: Understand basic principles of dietary content, such as calories, fat, sodium,  cholesterol and nutrients.;Short Term Goal: A plan has been developed with personal nutrition goals set during dietitian appointment.;Long Term Goal: Adherence to prescribed nutrition plan.       Nutrition Assessments: Nutrition Assessments - 04/03/18 1107      MEDFICTS Scores   Pre Score  13       Nutrition Goals Re-Evaluation: Nutrition Goals Re-Evaluation    Row Name 04/27/18 1058 05/07/18 1120           Goals   Current Weight  194 lb (88 kg)  -      Nutrition Goal  [place] meet [place] the dietician  Continue to work on increasing your daily fruit and vegetable intake, with less focus on starch-heavy meals- great job      Comment  -  Traditionally her meals were starch-centered, with little vegetables or protein. She also did not eat much fruit      Expected Outcome  Short: meet with RD on 7/22. Long: adhere to nutrition plan set up by dietician  She will work up to 1/2 her plate being vegetables/fruit, 1/4 plate protein and 1/4 plate starch, with extra servings of fruits and vegetables throughout the day as able        Personal Goal #2 Re-Evaluation   Personal Goal #2  -  Continue to educate yourself on foods high in sodium and added sugar. Look for foods with less than ~10g added sugar per serving and less than ~2597msodium per serving         Nutrition Goals Discharge (Final Nutrition Goals Re-Evaluation): Nutrition Goals Re-Evaluation - 05/07/18 1120      Goals   Nutrition Goal  Continue to work on increasing your daily fruit and vegetable intake, with less focus on starch-heavy meals- great job    Comment  Traditionally her meals were starch-centered, with little vegetables or protein. She also did not eat much fruit  Expected Outcome  She will work up to 1/2 her plate being vegetables/fruit, 1/4 plate protein and 1/4 plate starch, with extra servings of fruits and vegetables throughout the day as able      Personal Goal #2 Re-Evaluation   Personal Goal #2   Continue to educate yourself on foods high in sodium and added sugar. Look for foods with less than ~10g added sugar per serving and less than ~286m sodium per serving       Psychosocial: Target Goals: Acknowledge presence or absence of significant depression and/or stress, maximize coping skills, provide positive support system. Participant is able to verbalize types and ability to use techniques and skills needed for reducing stress and depression.   Initial Review & Psychosocial Screening: Initial Psych Review & Screening - 04/03/18 1105      Initial Review   Current issues with  Current Stress Concerns    Source of Stress Concerns  Chronic Illness    Comments  Her health is the only thing that is stressing her out.      Family Dynamics   Good Support System?  Yes    Comments  She can look to her oldest daughter, husband and her other daughters.      Barriers   Psychosocial barriers to participate in program  The patient should benefit from training in stress management and relaxation.      Screening Interventions   Interventions  Encouraged to exercise;Program counselor consult;To provide support and resources with identified psychosocial needs;Provide feedback about the scores to participant    Expected Outcomes  Short Term goal: Utilizing psychosocial counselor, staff and physician to assist with identification of specific Stressors or current issues interfering with healing process. Setting desired goal for each stressor or current issue identified.;Long Term Goal: Stressors or current issues are controlled or eliminated.;Short Term goal: Identification and review with participant of any Quality of Life or Depression concerns found by scoring the questionnaire.;Long Term goal: The participant improves quality of Life and PHQ9 Scores as seen by post scores and/or verbalization of changes       Quality of Life Scores:  Scores of 19 and below usually indicate a poorer quality of  life in these areas.  A difference of  2-3 points is a clinically meaningful difference.  A difference of 2-3 points in the total score of the Quality of Life Index has been associated with significant improvement in overall quality of life, self-image, physical symptoms, and general health in studies assessing change in quality of life.  PHQ-9: Recent Review Flowsheet Data    [facility]03[facility]2018 04/02/2018 04/20/2017   Decreased Interest 3 1 0   Down, Depressed, Hopeless 1 1 0   PHQ - 2 Score 4 2 0   Altered sleeping 0 0 -   Tired, decreased energy 3 1 -   Change in appetite 1 0 -   Feeling bad or failure about yourself  2 0 -   Trouble concentrating 3 1 -   Moving slowly or fidgety/restless 0 0 -   Suicidal thoughts 0 0 -   PHQ-9 Score 13 4 -   Difficult doing work/chores Very difficult Not difficult at all -     Interpretation of Total Score  Total Score Depression Severity:  1-4 = Minimal depression, 5-9 = Mild depression, 10-14 = Moderate depression, 15-19 = Moderately severe depression, 20-27 = Severe depression   Psychosocial Evaluation and Intervention: Psychosocial Evaluation - 04/11/18 1206  Psychosocial Evaluation & Interventions   Interventions  Encouraged to exercise with the program and follow exercise prescription    Comments  Counselor met with Ms. Fickett Stacie [name]) today for initial psychosocial evaluation.  She is a 62 year old who has a strong support system with a spouse and (4) adult children.  Stacie [name] has sarcoidosis and this has taken it's toll on her energy level.  She sleeps well and has a good appetite.  Stacie [name] denies a history of depression or anxiety or any current symptoms and she reports being in a positive mood most of the time.  Stacie [name] has minimal stress in her life other than her health.  She has goals to lose some weight while in this program and increase her energy/stamina.  Staff will follow with [facility]    Expected Outcomes  Short:  Stacie [name]  will exercise to increase her energy and for stress in her life.  She will meet with the dietician to address her weight loss goal.   Long:  Stacie [name] will exercise consistently for positive self care and stress reduction.      Continue Psychosocial Services   Follow up required by staff       Psychosocial Re-Evaluation: Psychosocial Re-Evaluation    [place] Name 04/25/18 1115 04/27/18 1059           Psychosocial Re-Evaluation   Current issues with  Current Stress Concerns  Current Stress Concerns      Comments  Counselor follow up with Stacie [name] today reporting feeling stronger and losing a little weight since coming into this program.  She states she feels frustrated at times with her health and limitations but is learning to keep focused on the positive things she is able to do for her health benefits.  Counselor commended her for progress made and positive coping strategies.    Stacie [name] is managing her chronic illness well. She finds joy in her grandchildren and doing activities with them. She is taking it one day at a time, but is still frustrated with her illness and medication. She is ending her Prednisone and hopes to feel better after that. She enjoys coming to [place] and is looking forward to coming on a more consistent basis now that she is back from vacation      Expected Outcomes  Short:  Stacie [name] will continue practicing positive coping strategies including exercise.   Long:  Stacie [name] will learn life long skills of exercise and positive coping for her overall heatlh benefits.    Short: Stacie [name] will continue to come to [name] for exercise and stress reduction. Long: Stacie [name] will find a hobby/activity to keep her getting out of the house and staying active after [name].       Interventions  Relaxation education;Stress management education  -         Psychosocial Discharge (Final Psychosocial Re-Evaluation): Psychosocial Re-Evaluation - 04/27/18 1059      Psychosocial Re-Evaluation   Current  issues with  Current Stress Concerns    Comments  Stacie [name] is managing her chronic illness well. She finds joy in her grandchildren and doing activities with them. She is taking it one day at a time, but is still frustrated with her illness and medication. She is ending her Prednisone and hopes to feel better after that. She enjoys coming to [place] and is looking forward to coming on a more consistent basis now that she is back from vacation    Expected Outcomes  Short: Stacie [name] will continue to come to  [name] for exercise and stress reduction. Long: Stacie [name] will find a hobby/activity to keep her getting out of the house and staying active after [name].        Education: Education Goals: Education classes will be provided on a weekly basis, covering required topics. Participant will state understanding/return demonstration of topics presented.  Learning Barriers/Preferences: Learning Barriers/Preferences - 04/03/18 1112      Learning Barriers/Preferences   Learning Barriers  Sight   reading glasses and prescription   Learning Preferences  Audio;None       Education Topics:  Initial Evaluation Education: - Verbal, written and demonstration of respiratory meds, oximetry and breathing techniques. Instruction on use of nebulizers and MDIs and importance of monitoring MDI activations.   Pulmonary Rehab from 05/30/2018 in [facility] Cardiac and Pulmonary Rehab  Date  04/03/18  Educator  [facility]  Instruction Review Code  1- Verbalizes Understanding      General Nutrition Guidelines/Fats and Fiber: -Group instruction provided by verbal, written material, models and posters to present the general guidelines for heart healthy nutrition. Gives an explanation and review of dietary fats and fiber.   Pulmonary Rehab from 05/30/2018 in [facility] Cardiac and Pulmonary Rehab  Date  04/30/18  Educator  CR  Instruction Review Code  1- Verbalizes Understanding      Controlling Sodium/Reading Food Labels: -Group  verbal and written material supporting the discussion of sodium use in heart healthy nutrition. Review and explanation with models, verbal and written materials for utilization of the food label.   Pulmonary Rehab from 05/30/2018 in [facility] Cardiac and Pulmonary Rehab  Date  05/07/18  Educator  CR  Instruction Review Code  1- Verbalizes Understanding      Exercise Physiology & General Exercise Guidelines: - Group verbal and written instruction with models to review the exercise physiology of the cardiovascular system and associated critical values. Provides general exercise guidelines with specific guidelines to those with heart or lung disease.    Aerobic Exercise & Resistance Training: - Gives group verbal and written instruction on the various components of exercise. Focuses on aerobic and resistive training programs and the benefits of this training and how to safely progress through these programs.   Pulmonary Rehab from 05/30/2018 in [facility] Cardiac and Pulmonary Rehab  Date  05/02/18  Educator  [facility]  Instruction Review Code  1- Verbalizes Understanding      Flexibility, Balance, Mind/Body Relaxation: Provides group verbal/written instruction on the benefits of flexibility and balance training, including mind/body exercise modes such as yoga, pilates and tai chi.  Demonstration and skill practice provided.   Stress and Anxiety: - Provides group verbal and written instruction about the health risks of elevated stress and causes of high stress.  Discuss the correlation between heart/lung disease and anxiety and treatment options. Review healthy ways to manage with stress and anxiety.   Depression: - Provides group verbal and written instruction on the correlation between heart/lung disease and depressed mood, treatment options, and the stigmas associated with seeking treatment.   Pulmonary Rehab from 05/30/2018 in [facility] Cardiac and Pulmonary Rehab  Date  04/25/18  Educator  [facility]  Instruction  Review Code  1- Verbalizes Understanding      Exercise & Equipment Safety: - Individual verbal instruction and demonstration of equipment use and safety with use of the equipment.   Pulmonary Rehab from 05/30/2018 in [facility] Cardiac and Pulmonary Rehab  Date  04/03/18  Educator  [facility]  Instruction Review Code  1- Verbalizes Understanding  Infection Prevention: - Provides verbal and written material to individual with discussion of infection control including proper hand washing and proper equipment cleaning during exercise session.   Pulmonary Rehab from 05/30/2018 in [facility] Cardiac and Pulmonary Rehab  Date  04/03/18  Educator  [facility]  Instruction Review Code  1- Verbalizes Understanding      Falls Prevention: - Provides verbal and written material to individual with discussion of falls prevention and safety.   Pulmonary Rehab from 05/30/2018 in [facility] Cardiac and Pulmonary Rehab  Date  04/03/18  Educator  [facility]  Instruction Review Code  1- Verbalizes Understanding      Diabetes: - Individual verbal and written instruction to review signs/symptoms of diabetes, desired ranges of glucose level fasting, after meals and with exercise. Advice that pre and post exercise glucose checks will be done for 3 sessions at entry of program.   Chronic Lung Diseases: - Group verbal and written instruction to review updates, respiratory medications, advancements in procedures and treatments. Discuss use of supplemental oxygen including available portable oxygen systems, continuous and intermittent flow rates, concentrators, personal use and safety guidelines. Review proper use of inhaler and spacers. Provide informative websites for self-education.    Energy Conservation: - Provide group verbal and written instruction for methods to conserve energy, plan and organize activities. Instruct on pacing techniques, use of adaptive equipment and posture/positioning to relieve shortness of breath.   Triggers and  Exacerbations: - Group verbal and written instruction to review types of environmental triggers and ways to prevent exacerbations. Discuss weather changes, air quality and the benefits of nasal washing. Review warning signs and symptoms to help prevent infections. Discuss techniques for effective airway clearance, coughing, and vibrations.   AED/CPR: - Group verbal and written instruction with the use of models to demonstrate the basic use of the AED with the basic ABC's of resuscitation.   Anatomy and Physiology of the Lungs: - Group verbal and written instruction with the use of models to provide basic lung anatomy and physiology related to function, structure and complications of lung disease.   Anatomy & Physiology of the Heart: - Group verbal and written instruction and models provide basic cardiac anatomy and physiology, with the coronary electrical and arterial systems. Review of Valvular disease and Heart Failure   Pulmonary Rehab from 05/30/2018 in [facility] Cardiac and Pulmonary Rehab  Date  05/16/18  Educator  [facility]  Instruction Review Code  5- Refused Teaching      Cardiac Medications: - Group verbal and written instruction to review commonly prescribed medications for heart disease. Reviews the medication, class of the drug, and side effects.   Pulmonary Rehab from 05/30/2018 in [facility] Cardiac and Pulmonary Rehab  Date  05/30/18  Educator  [facility]  Instruction Review Code  1- Verbalizes Understanding      Know Your Numbers and Risk Factors: -Group verbal and written instruction about important numbers in your health.  Discussion of what are risk factors and how they play a role in the disease process.  Review of Cholesterol, Blood Pressure, Diabetes, and BMI and the role they play in your overall health.   Sleep Hygiene: -Provides group verbal and written instruction about how sleep can affect your health.  Define sleep hygiene, discuss sleep cycles and impact of sleep habits. Review  good sleep hygiene tips.    [facility] Card[facility] Pulmonary Rehab  Date  04/11/18  Educator  [facility]  Instruction Review Code  1- Verbalizes Understanding  Other: -Provides group and verbal instruction on various topics (see comments)    Knowledge Questionnaire Score: Knowledge Questionnaire Score - 04/03/18 1112      Knowledge Questionnaire Score   Pre Score  16/18   reviewed       Core Components/Risk Factors/Patient Goals at Admission: Personal Goals and Risk Factors at Admission - 05/30/18 1037      Core Components/Risk Factors/Patient Goals on Admission    Weight Management  Yes       Core Components/Risk Factors/Patient Goals Review:  Goals and Risk Factor Review    Row Name 04/27/18 1048 05/30/18 1038           Core Components/Risk Factors/Patient Goals Review   Personal Goals Review  Weight Management/Obesity;Improve shortness of breath with ADL's;Hypertension  Weight Management/Obesity;Improve shortness of breath with ADL's;Other;Hypertension;Develop more efficient breathing techniques such as purse lipped breathing and diaphragmatic breathing and practicing self-pacing with activity.      Review  Stacie [name]'s weight is still fluctuating related to Prednisone. She is coming off of another round and hopes to be able to get back on track. Her blood pressure has been stable. She has noticed an improvement with her SOB already since starting [name]. She reported being able to walk up her stairs at home with little difficulty  Stacie [name] has continued to lose weight and is down 20 lb total - around 8-10 lb since starting LW.  She has noticed she can walk up stairs without "feeling like I m going to vomit".  She can also walk all the way down to the gym without stopping to rest.  She is taking all meds as directed.  She has noticed more overall strength.  She states her breathing is better but not as much an improvement as strength.  She uses PLB daily.         Expected Outcomes  Short: Stacie [name] will continue to work on her SOB by attending exercise and work on her goal weight. Long: reach goal weight of 150lb through exercise and healthy eating.   Short - continue to exercise and follow nutrition guidlines to lose weight Long - be able to do all ADLs without SOB or undue fatigue         Core Components/Risk Factors/Patient Goals at Discharge (Final Review):  Goals and Risk Factor Review - 05/30/18 1038      Core Components/Risk Factors/Patient Goals Review   Personal Goals Review  Weight Management/Obesity;Improve shortness of breath with ADL's;Other;Hypertension;Develop more efficient breathing techniques such as purse lipped breathing and diaphragmatic breathing and practicing self-pacing with activity.    Review  Stacie [name] has continued to lose weight and is down 20 lb total - around 8-10 lb since starting LW.  She has noticed she can walk up stairs without "feeling like I m going to vomit".  She can also walk all the way down to the gym without stopping to rest.  She is taking all meds as directed.  She has noticed more overall strength.  She states her breathing is better but not as much an improvement as strength.  She uses PLB daily.      Expected Outcomes  Short - continue to exercise and follow nutrition guidlines to lose weight Long - be able to do all ADLs without SOB or undue fatigue       ITP Comments: ITP Comments    Row Name 04/03/18 1038 04/16/18 0945 05/14/18 0844 06/11/18 0821     ITP Comments  Medical Evaluation  completed. Chart sent for review and changes to Dr. Caryl Comes for Dr. Emily Filbert Director of [place]. Diagnosis can be found in [facility] encounter 03/22/18  30 day review completed. ITP sent to Dr. Emily Filbert Director of [place]. Continue with ITP unless changes are made by physician  30 day review completed. ITP sent to Dr. Emily Filbert Director of [place]. Continue with ITP unless changes are made by physician  30 day review  completed. ITP sent to Dr. Emily Filbert Director of [place]. Continue with ITP unless changes are made by physician       Comments: 30 day review

## 2018-06-11 NOTE — Progress Notes (Signed)
Daily Session Note  Patient Details  Name: Stacie [name] MRN: [id number] Date of Birth: 1955-12-26 Referring Provider:     Pulmonary Rehab from 04/03/2018 in [facility] Cardiac and Pulmonary Rehab  Referring Provider  [place]      Encounter Date: 06/11/2018  Check In: Session Check In - 06/11/18 1021      Check-In   Supervising physician immediately available to respond to emergencies  [name] immediately available ER MD    Physician(s)  Dr. Clearnce Hasten and Dr. Archie Balboa    Location  [facility]-Cardiac & Pulmonary Rehab    Staff Present  Earlean Shawl, BS, ACSM CEP, Exercise Physiologist;[facility] [place], ACSM CEP, Exercise Physiologist    Medication changes reported      No    Fall or balance concerns reported     No    Tobacco Cessation  No Change    Warm-up and Cool-down  Performed as group-led instruction    Resistance Training Performed  Yes    VAD Patient?  No    PAD/SET Patient?  No      Pain Assessment   Currently in Pain?  No/denies    Multiple Pain Sites  No          Social History   Tobacco Use  Smoking Status Never Smoker  Smokeless Tobacco Never Used    Goals Met:  Proper associated with RPD/PD & O2 Sat Independence with exercise equipment Exercise tolerated well No report of cardiac concerns or symptoms Strength training completed today  Goals Unmet:  Not Applicable  Comments: Pt able to follow exercise prescription today without complaint.  Will continue to monitor for progression.    Dr. Emily Filbert is Medical Director for [place] and [name].

## 2018-06-20 ENCOUNTER — Encounter: Payer: BLUE CROSS/BLUE SHIELD | Attending: Internal Medicine

## 2018-06-20 DIAGNOSIS — R0602 Shortness of breath: Secondary | ICD-10-CM | POA: Insufficient documentation

## 2018-06-20 DIAGNOSIS — D860 Sarcoidosis of lung: Secondary | ICD-10-CM | POA: Insufficient documentation

## 2018-06-21 ENCOUNTER — Ambulatory Visit (INDEPENDENT_AMBULATORY_CARE_PROVIDER_SITE_OTHER): Payer: BLUE CROSS/BLUE SHIELD | Admitting: Internal Medicine

## 2018-06-21 ENCOUNTER — Encounter: Payer: Self-pay | Admitting: Internal Medicine

## 2018-06-21 VITALS — BP 116/64 | HR 114 | Ht 66.5 in | Wt 183.0 lb

## 2018-06-21 DIAGNOSIS — I10 Essential (primary) hypertension: Secondary | ICD-10-CM

## 2018-06-21 DIAGNOSIS — Z23 Encounter for immunization: Secondary | ICD-10-CM

## 2018-06-21 DIAGNOSIS — F39 Unspecified mood [affective] disorder: Secondary | ICD-10-CM

## 2018-06-21 MED ORDER — AMLODIPINE BESYLATE 5 MG PO TABS: 5.0000 mg | ORAL_TABLET | Freq: Every day | ORAL | 1 refills | Status: DC

## 2018-06-21 NOTE — Progress Notes (Signed)
Date:  06/21/2018   Name:  Stacie [name]   DOB:  11-Oct-1956   MRN:  [id number]   Chief Complaint: Hypertension Depression         This is a new problem.  The problem has been gradually improving since onset.  Associated symptoms include fatigue.  Associated symptoms include no hopelessness, no decreased interest, no appetite change, no headaches and no suicidal ideas.  Past treatments include SSRIs - Selective serotonin reuptake inhibitors. Hypertension  This is a recurrent problem. The problem has been gradually improving since onset. Associated symptoms include shortness of breath. Pertinent negatives include no chest pain, headaches or palpitations. Past treatments include calcium channel blockers (amlodipine dose changed to 5 mg). There are no compliance problems.      Review of Systems  Constitutional: Positive for fatigue. Negative for appetite change, chills and fever.  HENT: Negative for trouble swallowing.   Respiratory: Positive for chest tightness, shortness of breath and wheezing.   Cardiovascular: Negative for chest pain, palpitations and leg swelling.  Gastrointestinal: Negative for abdominal pain and blood in stool.  Endocrine: Negative for polydipsia and polyuria.  Neurological: Positive for light-headedness. Negative for dizziness and headaches.  Hematological: Negative for adenopathy.  Psychiatric/Behavioral: Positive for depression. Negative for dysphoric mood, sleep disturbance and suicidal ideas. The patient is not nervous/anxious.     Patient Active Problem List   Diagnosis Date Noted  . Mood disorder ([place]) 06/05/2018  . Compression fx, thoracic spine ([place]) 12/04/2017  . Morbid obesity due to excess calories ([place]) 05/08/2017  . Hyperlipidemia, mixed 04/21/2017  . Elevated blood pressure reading 04/20/2017  . Cough variant asthma  vs UACS  02/20/2017  . Hypothyroidism, acquired 03/27/2015  . Essential hypertension 03/27/2015  . H/O abnormal cervical  Papanicolaou smear 03/27/2015  . H/O renal calculi 03/27/2015  . Allergic rhinitis 01/07/2015  . Obstructive sleep apnea 09/29/2014  . Other malaise and fatigue 06/03/2014  . Asthma, chronic 02/24/2014  . Cough 04/30/2013  . Pulmonary sarcoidosis ([place]) 03/12/2013  . CKD (chronic kidney disease) stage 3, GFR 30-59 ml/min (HCC) 03/12/2013  . Calcium blood increased 05/23/2012  . Besnier-Boeck disease 02/08/2011    Allergies  Allergen Reactions  . Nitrofurantoin Nausea Only  . Tramadol     Nausea and vomiting  . Sulfa Antibiotics Rash    As an infant    Past Surgical History:  Procedure Laterality Date  . COLON SURGERY     "colon was fused to bladder - operated on both"  . PARS PLANA VITRECTOMY Right 05/20/2015   Procedure: PARS PLANA VITRECTOMY WITH 25 GAUGE, laser;  Surgeon: Milus Height, MD;  Location: [facility] ORS;  Service: Ophthalmology;  Laterality: Right;  . PARTIAL HYSTERECTOMY  1990  . TUBAL LIGATION      Social History   Tobacco Use  . Smoking status: Never Smoker  . Smokeless tobacco: Never Used  Substance Use Topics  . Alcohol use: No  . Drug use: No     Medication list has been reviewed and updated.  Current Meds  Medication Sig  . Acetaminophen-Codeine (TYLENOL/CODEINE #3) 300-30 MG tablet Take 1 tablet by mouth every 4 (four) hours as needed for pain.  Marland Kitchen albuterol (VENTOLIN HFA) 108 (90 Base) MCG/ACT inhaler Inhale 2 puffs into the lungs every 6 (six) hours as needed for wheezing.  . budesonide-formoterol (SYMBICORT) 80-4.5 MCG/ACT inhaler Take 2 puffs first thing in am and then another 2 puffs about 12 hours later.  . cetirizine (ZYRTEC) 10  MG tablet Take 10 mg by mouth daily as needed.   Marland Kitchen escitalopram (LEXAPRO) 10 MG tablet TAKE 1 TABLET BY MOUTH EVERY DAY  . FORTEO 600 MCG/2.4ML SOLN   . GLUCOSAMINE PO Take 1 tablet by mouth daily.  Marland Kitchen HYDROcodone-acetaminophen (NORCO/VICODIN) 5-325 MG tablet Take 1 tablet by mouth every 4 (four) hours as needed.    . mometasone (NASONEX) 50 MCG/ACT nasal spray Place 1 spray into the nose 2 (two) times daily.  . Multiple Vitamin (MULTIVITAMIN) capsule Take 1 capsule by mouth 2 (two) times daily.   . NON FORMULARY CPAP nightly  . predniSONE (DELTASONE) 10 MG tablet Take 7.5 mg by mouth.   . Respiratory Therapy Supplies (FLUTTER) DEVI Use as directed  . SYNTHROID 137 MCG tablet TAKE 1 TABLET (137 MCG TOTAL) BY MOUTH DAILY BEFORE BREAKFAST.  . [DISCONTINUED] amLODipine (NORVASC) 10 MG tablet Take 5 mg by mouth daily.     PHQ 2/9 Scores 06/21/2018 04/03/2018 04/02/2018 04/20/2017  PHQ - 2 Score 0 4 2 0  PHQ- 9 Score _0 -    Physical Exam  Constitutional: She is oriented to person, place, and time. She appears well-developed. No distress.  HENT:  Head: Normocephalic and atraumatic.  Neck: Normal range of motion. Neck supple.  Cardiovascular: Regular rhythm, normal heart sounds and normal pulses. Tachycardia present.  No murmur heard. Pulmonary/Chest: Effort normal. No respiratory distress. She has wheezes in the left lower field. She has no rhonchi.  Musculoskeletal: Normal range of motion.  Neurological: She is alert and oriented to person, place, and time.  Skin: Skin is warm and dry. No rash noted.  Psychiatric: She has a normal mood and affect. Her behavior is normal. Judgment and thought content normal. Cognition and memory are normal.  Nursing note and vitals reviewed.   BP 116/64 (BP Location: Right Arm, Patient Position: Sitting, Cuff Size: Normal)   Pulse (!) 114   Ht 5' 6.5" (1.689 m)   Wt 183 lb (83 kg)   SpO2 (!) 87%   BMI 29.09 kg/m   Assessment and Plan: 1. Essential hypertension Controlled, continue 5 mg amlodipine - amLODipine (NORVASC) 5 MG tablet; Take 1 tablet (5 mg total) by mouth daily.  Dispense: 90 tablet; Refill: 1  2. Mood disorder (HCC) Improving on Lexapro 10 mg per day Continue current dose, consider increase if needed  3. Need for influenza vaccination - Flu  Vaccine QUAD 36+ mos IM   Meds ordered this encounter  Medications  . amLODipine (NORVASC) 5 MG tablet    Sig: Take 1 tablet (5 mg total) by mouth daily.    Dispense:  90 tablet    Refill:  1    Partially dictated using Editor, commissioning. Any errors are unintentional.  Halina Maidens, MD [facility]  06/21/2018

## 2018-06-22 ENCOUNTER — Encounter: Payer: BLUE CROSS/BLUE SHIELD | Admitting: *Deleted

## 2018-06-22 DIAGNOSIS — R0602 Shortness of breath: Secondary | ICD-10-CM | POA: Diagnosis not present

## 2018-06-22 DIAGNOSIS — D860 Sarcoidosis of lung: Secondary | ICD-10-CM | POA: Diagnosis not present

## 2018-06-22 NOTE — Progress Notes (Signed)
Daily Session Note  Patient Details  Name: MARJA ADDERLEY MRN: [id number] Date of Birth: [birth year] Referring Provider:     Pulmonary Rehab from 04/03/2018 in [facility] Cardiac and Pulmonary Rehab  Referring Provider  [place]      Encounter Date: 06/22/2018  Check In: Session Check In - 06/22/18 1012      Check-In   Supervising physician immediately available to respond to emergencies  [name] immediately available ER MD    Physician(s)  Dr. Corky Downs and [facility]    Location  [facility]-Cardiac & Pulmonary Rehab    Staff Present  Renita Papa, RN BSN;Jessica Luan Pulling, MA, RCEP, CCRP, Exercise Physiologist;Joseph Tessie Fass RCP,RRT,BSRT    Medication changes reported      No    Fall or balance concerns reported     No    Tobacco Cessation  No Change    Warm-up and Cool-down  Performed as group-led instruction    Resistance Training Performed  Yes    VAD Patient?  No      Pain Assessment   Currently in Pain?  No/denies          Social History   Tobacco Use  Smoking Status Never Smoker  Smokeless Tobacco Never Used    Goals Met:  Proper associated with RPD/PD & O2 Sat Independence with exercise equipment Using PLB without cueing & demonstrates good technique Exercise tolerated well No report of cardiac concerns or symptoms Strength training completed today  Goals Unmet:  Not Applicable  Comments: Pt able to follow exercise prescription today without complaint.  Will continue to monitor for progression.    Dr. Emily Filbert is Medical Director for [place] and [name].

## 2018-06-24 DIAGNOSIS — G4733 Obstructive sleep apnea (adult) (pediatric): Secondary | ICD-10-CM | POA: Diagnosis not present

## 2018-06-25 ENCOUNTER — Encounter: Payer: BLUE CROSS/BLUE SHIELD | Admitting: *Deleted

## 2018-06-25 DIAGNOSIS — R0602 Shortness of breath: Secondary | ICD-10-CM | POA: Diagnosis not present

## 2018-06-25 DIAGNOSIS — D860 Sarcoidosis of lung: Secondary | ICD-10-CM

## 2018-06-25 NOTE — Progress Notes (Signed)
Daily Session Note  Patient Details  Name: Stacie [name] MRN: [id number] Date of Birth: Mar 22, 1956 Referring Provider:     Pulmonary Rehab from 04/03/2018 in [facility] Pul[facility] Rehab  Referring Provider  [place]      Encounter Date: 06/25/2018  Check In: Session Check In - 06/25/18 1044      Check-In   Supervising physician immediately available to respond to emergencies  [name] immediately available ER MD    Physician(s)  Dr. Kerman Passey and Dr. Joni Fears    Location  [facility]-Cardiac & Pulmonary Rehab    Staff Present  Earlean Shawl, BS, ACSM CEP, Exercise Physiologist;Joseph [facility], [place], ACSM CEP, Exercise Physiologist    Medication changes reported      No    Fall or balance concerns reported     No    Tobacco Cessation  No Change    Warm-up and Cool-down  Performed as group-led instruction    Resistance Training Performed  Yes    VAD Patient?  No    PAD/SET Patient?  No      Pain Assessment   Currently in Pain?  No/denies    Multiple Pain Sites  No          Social History   Tobacco Use  Smoking Status Never Smoker  Smokeless Tobacco Never Used    Goals Met:  Proper associated with RPD/PD & O2 Sat Independence with exercise equipment Exercise tolerated well No report of cardiac concerns or symptoms Strength training completed today  Goals Unmet:  Not Applicable  Comments: Pt able to follow exercise prescription today without complaint.  Will continue to monitor for progression.    Dr. Emily Filbert is Medical Director for [place] and [name].

## 2018-06-27 ENCOUNTER — Encounter: Payer: BLUE CROSS/BLUE SHIELD | Admitting: *Deleted

## 2018-06-27 DIAGNOSIS — D860 Sarcoidosis of lung: Secondary | ICD-10-CM

## 2018-06-27 DIAGNOSIS — R0602 Shortness of breath: Secondary | ICD-10-CM | POA: Diagnosis not present

## 2018-06-27 NOTE — Progress Notes (Signed)
Daily Session Note  Patient Details  Name: Stacie [name] MRN: [id number] Date of Birth: 1956-01-31 Referring Provider:     Pulmonary Rehab from 04/03/2018 in [facility] Cardiac and Pulmonary Rehab  Referring Provider  [place]      Encounter Date: 06/27/2018  Check In: Session Check In - 06/27/18 1133      Check-In   Supervising physician immediately available to respond to emergencies  [name] immediately available ER MD    Physician(s)  Drs. Alyse Low    Location  [facility]-Cardiac & Pulmonary Rehab    Staff Present  Heath Lark, RN, BSN, CCRP;Laurelyn Terrero [place], MA, RCEP, CCRP, Exercise Physiologist;Joseph Tessie Fass RCP,RRT,BSRT    Medication changes reported      No    Fall or balance concerns reported     No    Warm-up and Cool-down  Performed as group-led instruction    Resistance Training Performed  Yes    VAD Patient?  No    PAD/SET Patient?  No      Pain Assessment   Currently in Pain?  No/denies          Social History   Tobacco Use  Smoking Status Never Smoker  Smokeless Tobacco Never Used    Goals Met:  Proper associated with RPD/PD & O2 Sat Independence with exercise equipment Using PLB without cueing & demonstrates good technique Exercise tolerated well No report of cardiac concerns or symptoms Strength training completed today  Goals Unmet:  Not Applicable  Comments: Pt able to follow exercise prescription today without complaint.  Will continue to monitor for progression.    Dr. Emily Filbert is Medical Director for [place] and [name].

## 2018-06-29 DIAGNOSIS — D860 Sarcoidosis of lung: Secondary | ICD-10-CM

## 2018-06-29 DIAGNOSIS — R0602 Shortness of breath: Secondary | ICD-10-CM | POA: Diagnosis not present

## 2018-06-29 NOTE — Progress Notes (Signed)
Daily Session Note  Patient Details  Name: Stacie [name] MRN: [id number] Date of Birth: April 05, 1956 Referring Provider:     Pulmonary Rehab from 04/03/2018 in [facility] Card[facility] and Pulmonary Rehab  Referring Provider  [place]      Encounter Date: 06/29/2018  Check In: Session Check In - 06/29/18 1019      Check-In   Supervising physician immediately available to respond to emergencies  [name] immediately available ER MD    Physician(s)  Dr. Kerman Passey and [facility] [address]    Location  [facility]-Cardiac & Pulmonary Rehab    Staff Present  Renita Papa, RN BSN;Joseph [facility], [place], ACSM CEP, Exercise Physiologist    Medication changes reported      No    Fall or balance concerns reported     No    Tobacco Cessation  No Change    Warm-up and Cool-down  Performed as group-led instruction    Resistance Training Performed  Yes    VAD Patient?  No    PAD/SET Patient?  No      Pain Assessment   Currently in Pain?  No/denies          Social History   Tobacco Use  Smoking Status Never Smoker  Smokeless Tobacco Never Used    Goals Met:  Proper associated with RPD/PD & O2 Sat Independence with exercise equipment Using PLB without cueing & demonstrates good technique Exercise tolerated well No report of cardiac concerns or symptoms Strength training completed today  Goals Unmet:  Not Applicable  Comments: Pt able to follow exercise prescription today without complaint.  Will continue to monitor for progression.    Dr. Emily Filbert is Medical Director for [place] and [name].

## 2018-07-02 ENCOUNTER — Encounter: Payer: BLUE CROSS/BLUE SHIELD | Admitting: *Deleted

## 2018-07-02 DIAGNOSIS — R0602 Shortness of breath: Secondary | ICD-10-CM | POA: Diagnosis not present

## 2018-07-02 DIAGNOSIS — D860 Sarcoidosis of lung: Secondary | ICD-10-CM

## 2018-07-02 NOTE — Progress Notes (Signed)
Daily Session Note  Patient Details  Name: Stacie [name] MRN: [id number] Date of Birth: [birth year] Referring Provider:     Pulmonary Rehab from 04/03/2018 in [facility] Cardiac and Pulmonary Rehab  Referring Provider  [place]      Encounter Date: 07/02/2018  Check In: Session Check In - 07/02/18 1013      Check-In   Supervising physician immediately available to respond to emergencies  [name] immediately available ER MD    Physician(s)  Dr. Mariea Clonts and Dr. Corky Downs    Location  [facility]-Cardiac & Pulmonary Rehab    Staff Present  Justin Mend RCP,RRT,BSRT;Amanda Oletta Darter, [place], ACSM CEP, Exercise Physiologist;Mylen Mangan Amedeo Plenty, BS, ACSM CEP, Exercise Physiologist    Medication changes reported      No    Fall or balance concerns reported     No    Tobacco Cessation  No Change    Warm-up and Cool-down  Performed as group-led instruction    Resistance Training Performed  Yes    VAD Patient?  No    PAD/SET Patient?  No      Pain Assessment   Currently in Pain?  No/denies    Multiple Pain Sites  No          Social History   Tobacco Use  Smoking Status Never Smoker  Smokeless Tobacco Never Used    Goals Met:  Proper associated with RPD/PD & O2 Sat Independence with exercise equipment Exercise tolerated well No report of cardiac concerns or symptoms Strength training completed today  Goals Unmet:  Not Applicable  Comments: Pt able to follow exercise prescription today without complaint.  Will continue to monitor for progression.    Dr. Emily Filbert is Medical Director for [place] and [name].

## 2018-07-06 DIAGNOSIS — D860 Sarcoidosis of lung: Secondary | ICD-10-CM

## 2018-07-06 DIAGNOSIS — R0602 Shortness of breath: Secondary | ICD-10-CM | POA: Diagnosis not present

## 2018-07-06 NOTE — Progress Notes (Signed)
Daily Session Note  Patient Details  Name: Stacie [name] MRN: [id number] Date of Birth: 1956-03-09 Referring Provider:     Pulmonary Rehab from 04/03/2018 in [facility] Pulmonary Rehab  Referring Provider  [place]      Encounter Date: 07/06/2018  Check In: Session Check In - 07/06/18 1053      Check-In   Supervising physician immediately available to respond to emergencies  [name] immediately available ER MD    Physician(s)  Dr. Archie Balboa and Quentin Cornwall    Location  [facility]-Cardiac & Pulmonary Rehab    Staff Present  Justin Mend RCP,RRT,BSRT;Meredith Sherryll Burger, RN Vickki Hearing, BA, ACSM CEP, Exercise Physiologist    Medication changes reported      No    Fall or balance concerns reported     No    Warm-up and Cool-down  Performed as group-led instruction    Resistance Training Performed  Yes    VAD Patient?  No      Pain Assessment   Currently in Pain?  No/denies          Social History   Tobacco Use  Smoking Status Never Smoker  Smokeless Tobacco Never Used    Goals Met:  Independence with exercise equipment Exercise tolerated well No report of cardiac concerns or symptoms Strength training completed today  Goals Unmet:  Not Applicable  Comments: Pt able to follow exercise prescription today without complaint.  Will continue to monitor for progression.   Dr. Emily Filbert is Medical Director for [place] and [name].

## 2018-07-09 ENCOUNTER — Encounter: Payer: BLUE CROSS/BLUE SHIELD | Admitting: *Deleted

## 2018-07-09 DIAGNOSIS — D860 Sarcoidosis of lung: Secondary | ICD-10-CM

## 2018-07-09 DIAGNOSIS — R0602 Shortness of breath: Secondary | ICD-10-CM | POA: Diagnosis not present

## 2018-07-09 NOTE — Progress Notes (Signed)
Pulmonary Individual Treatment Plan  Patient Details  Name: Stacie [name] MRN: [id number] Date of Birth: 12-20-1955 Referring Provider:     Pulmonary Rehab from 04/03/2018 in [facility] Cardiac and Pulmonary Rehab  Referring Provider  Rackley      Initial Encounter Date:    Pulmonary Rehab from 04/03/2018 in [facility] Cardiac and Pulmonary Rehab  Date  04/03/18      Visit Diagnosis: Pulmonary sarcoidosis (HCC)  Patient's Home Medications on Admission:  Current Outpatient Medications:  .  Acetaminophen-Codeine (TYLENOL/CODEINE #3) 300-30 MG tablet, Take 1 tablet by mouth every 4 (four) hours as needed for pain., Disp: 42 tablet, Rfl: 0 .  albuterol (VENTOLIN HFA) 108 (90 Base) MCG/ACT inhaler, Inhale 2 puffs into the lungs every 6 (six) hours as needed for wheezing., Disp: 1 Inhaler, Rfl: 2 .  amLODipine (NORVASC) 5 MG tablet, Take 1 tablet (5 mg total) by mouth daily., Disp: 90 tablet, Rfl: 1 .  budesonide-formoterol (SYMBICORT) 80-4.5 MCG/ACT inhaler, Take 2 puffs first thing in am and then another 2 puffs about 12 hours later., Disp: 1 Inhaler, Rfl: 11 .  cetirizine (ZYRTEC) 10 MG tablet, Take 10 mg by mouth daily as needed. , Disp: , Rfl:  .  escitalopram (LEXAPRO) 10 MG tablet, TAKE 1 TABLET BY MOUTH EVERY DAY, Disp: 90 tablet, Rfl: 1 .  FORTEO 600 MCG/2.4ML SOLN, , Disp: , Rfl:  .  GLUCOSAMINE PO, Take 1 tablet by mouth daily., Disp: , Rfl:  .  HYDROcodone-acetaminophen (NORCO/VICODIN) 5-325 MG tablet, Take 1 tablet by mouth every 4 (four) hours as needed., Disp: , Rfl: 0 .  mometasone (NASONEX) 50 MCG/ACT nasal spray, Place 1 spray into the nose 2 (two) times daily., Disp: , Rfl:  .  Multiple Vitamin (MULTIVITAMIN) capsule, Take 1 capsule by mouth 2 (two) times daily. , Disp: , Rfl:  .  NON FORMULARY, CPAP nightly, Disp: , Rfl:  .  predniSONE (DELTASONE) 10 MG tablet, Take 7.5 mg by mouth. , Disp: , Rfl:  .  Respiratory Therapy Supplies (FLUTTER) DEVI, Use as directed, Disp: 1 each,  Rfl: 0 .  SYNTHROID 137 MCG tablet, TAKE 1 TABLET (137 MCG TOTAL) BY MOUTH DAILY BEFORE BREAKFAST., Disp: 90 tablet, Rfl: 1  Past Medical History: Past Medical History:  Diagnosis Date  . Arrhythmia    patient unaware if this is current  . Asthma   . HOH (hard of hearing)    wear aids  . Hypothyroidism   . IBS (irritable bowel syndrome)   . Sarcoid   . Sarcoidosis   . Seasonal allergies   . Sleep apnea CPAP with O2    Tobacco Use: Social History   Tobacco Use  Smoking Status Never Smoker  Smokeless Tobacco Never Used    Labs: Recent Review Advice worker    Labs for ITP Cardiac and Pulmonary Rehab Latest Ref Rng & Units 04/20/2017   Cholestrol 100 - 199 mg/dL 782(N)   LDLCALC 0 - 99 mg/dL 562(Z)   HDL >30 mg/dL 54   Trlycerides 0 - 865 mg/dL 784(O)   Hemoglobin N6E 4.8 - 5.6 % 6.0(H)       Pulmonary Assessment Scores: Pulmonary Assessment Scores    Row Name 04/03/18 1108         ADL UCSD   ADL Phase  Entry     SOB Score total  80     Rest  2     Walk  3     Stairs  5  Bath  4     Dress  3     Shop  4       CAT Score   CAT Score  22       mMRC Score   mMRC Score  3        Pulmonary Function Assessment: Pulmonary Function Assessment - 04/03/18 1112      Breath   Bilateral Breath Sounds  Wheezes;Decreased   slight wheezes in LLL   Shortness of Breath  Yes;Limiting activity       Exercise Target Goals: Exercise Program Goal: Individual exercise prescription set using results from initial 6 min walk test and THRR while considering  patient's activity barriers and safety.   Exercise Prescription Goal: Initial exercise prescription builds to 30-45 minutes a day of aerobic activity, 2-3 days per week.  Home exercise guidelines will be given to patient during program as part of exercise prescription that the participant will acknowledge.  Activity Barriers & Risk Stratification:   6 Minute Walk: 6 Minute Walk    Row Name 04/03/18 1301          6 Minute Walk   Distance  786 feet     Walk Time  4.9 minutes     # of Rest Breaks  1     MPH  1.82     METS  2.96     RPE  18     Perceived Dyspnea   3     VO2 Peak  10.4     Symptoms  No     Resting HR  121 bpm     Resting BP  128/66     Resting Oxygen Saturation   92 %     Exercise Oxygen Saturation  during 6 min walk  82 %     Max Ex. HR  133 bpm     Max Ex. BP  176/72     2 Minute Post BP  140/76       Interval HR   1 Minute HR  124     2 Minute HR  124     3 Minute HR  126     4 Minute HR  129     5 Minute HR  133     6 Minute HR  130     2 Minute Post HR  123     Interval Heart Rate?  Yes       Interval Oxygen   Interval Oxygen?  Yes     Baseline Oxygen Saturation %  92 %     1 Minute Oxygen Saturation %  93 %     1 Minute Liters of Oxygen  0 L     2 Minute Oxygen Saturation %  89 %     2 Minute Liters of Oxygen  0 L     3 Minute Oxygen Saturation %  85 %     3 Minute Liters of Oxygen  0 L     4 Minute Oxygen Saturation %  82 %     4 Minute Liters of Oxygen  0 L     5 Minute Oxygen Saturation %  86 %     5 Minute Liters of Oxygen  0 L     6 Minute Oxygen Saturation %  86 %     6 Minute Liters of Oxygen  0 L     2 Minute Post Oxygen Saturation %  93 %  Oxygen Initial Assessment: Oxygen Initial Assessment - 04/03/18 1115      Home Oxygen   Home Oxygen Device  Portable Concentrator    Sleep Oxygen Prescription  CPAP    Liters per minute  2    Home Exercise Oxygen Prescription  None    Home at Rest Exercise Oxygen Prescription  None    Compliance with Home Oxygen Use  Yes      Initial 6 min Walk   Oxygen Used  None      Program Oxygen Prescription   Program Oxygen Prescription  None      Intervention   Short Term Goals  To learn and exhibit compliance with exercise, home and travel O2 prescription;To learn and understand importance of maintaining oxygen saturations>88%;To learn and demonstrate proper use of respiratory medications;To learn  and demonstrate proper pursed lip breathing techniques or other breathing techniques.;To learn and understand importance of monitoring SPO2 with pulse oximeter and demonstrate accurate use of the pulse oximeter.    Long  Term Goals  Exhibits compliance with exercise, home and travel O2 prescription;Verbalizes importance of monitoring SPO2 with pulse oximeter and return demonstration;Maintenance of O2 saturations>88%;Exhibits proper breathing techniques, such as pursed lip breathing or other method taught during program session;Compliance with respiratory medication;Demonstrates proper use of MDI's       Oxygen Re-Evaluation: Oxygen Re-Evaluation    Row Name 04/09/18 1027 06/22/18 1024           Program Oxygen Prescription   Program Oxygen Prescription  None  None        Home Oxygen   Home Oxygen Device  Portable Concentrator  Portable Concentrator      Sleep Oxygen Prescription  CPAP  CPAP      Liters per minute  2  2      Home Exercise Oxygen Prescription  None  None      Home at Rest Exercise Oxygen Prescription  None  None      Compliance with Home Oxygen Use  Yes  Yes        Goals/Expected Outcomes   Short Term Goals  To learn and exhibit compliance with exercise, home and travel O2 prescription;To learn and understand importance of maintaining oxygen saturations>88%;To learn and demonstrate proper use of respiratory medications;To learn and demonstrate proper pursed lip breathing techniques or other breathing techniques.;To learn and understand importance of monitoring SPO2 with pulse oximeter and demonstrate accurate use of the pulse oximeter.  To learn and exhibit compliance with exercise, home and travel O2 prescription;To learn and understand importance of maintaining oxygen saturations>88%;To learn and demonstrate proper use of respiratory medications;To learn and demonstrate proper pursed lip breathing techniques or other breathing techniques.;To learn and understand importance  of monitoring SPO2 with pulse oximeter and demonstrate accurate use of the pulse oximeter.      Long  Term Goals  Exhibits compliance with exercise, home and travel O2 prescription;Verbalizes importance of monitoring SPO2 with pulse oximeter and return demonstration;Maintenance of O2 saturations>88%;Exhibits proper breathing techniques, such as pursed lip breathing or other method taught during program session;Compliance with respiratory medication;Demonstrates proper use of MDI's  Exhibits compliance with exercise, home and travel O2 prescription;Verbalizes importance of monitoring SPO2 with pulse oximeter and return demonstration;Maintenance of O2 saturations>88%;Exhibits proper breathing techniques, such as pursed lip breathing or other method taught during program session;Compliance with respiratory medication;Demonstrates proper use of MDI's      Comments  Reviewed PLB technique with pt.  Talked about how it work  and it's important to maintaining his exercise saturations.    Stacie [name] has been compliant with her CPAP and inhaler.  She has not had any problems with either. She is using her PLB and diaphragmactic breathing as well. She finds these to be helpful to reducing her SOB.       Goals/Expected Outcomes  Short: Become more profiecient at using PLB.   Long: Become independent at using PLB.  Short: Continue to use PLB.  Long: Continued compliance.          Oxygen Discharge (Final Oxygen Re-Evaluation): Oxygen Re-Evaluation - 06/22/18 1024      Program Oxygen Prescription   Program Oxygen Prescription  None      Home Oxygen   Home Oxygen Device  Portable Concentrator    Sleep Oxygen Prescription  CPAP    Liters per minute  2    Home Exercise Oxygen Prescription  None    Home at Rest Exercise Oxygen Prescription  None    Compliance with Home Oxygen Use  Yes      Goals/Expected Outcomes   Short Term Goals  To learn and exhibit compliance with exercise, home and travel O2 prescription;To  learn and understand importance of maintaining oxygen saturations>88%;To learn and demonstrate proper use of respiratory medications;To learn and demonstrate proper pursed lip breathing techniques or other breathing techniques.;To learn and understand importance of monitoring SPO2 with pulse oximeter and demonstrate accurate use of the pulse oximeter.    Long  Term Goals  Exhibits compliance with exercise, home and travel O2 prescription;Verbalizes importance of monitoring SPO2 with pulse oximeter and return demonstration;Maintenance of O2 saturations>88%;Exhibits proper breathing techniques, such as pursed lip breathing or other method taught during program session;Compliance with respiratory medication;Demonstrates proper use of MDI's    Comments  Stacie [name] has been compliant with her CPAP and inhaler.  She has not had any problems with either. She is using her PLB and diaphragmactic breathing as well. She finds these to be helpful to reducing her SOB.     Goals/Expected Outcomes  Short: Continue to use PLB.  Long: Continued compliance.        Initial Exercise Prescription: Initial Exercise Prescription - 04/03/18 1300      Date of Initial Exercise RX and Referring Provider   Date  04/03/18    Referring Provider  Rackley      Treadmill   MPH  1.5    Grade  0    Minutes  15    METs  2.15      NuStep   Level  2    SPM  80    Minutes  15    METs  2.1      Biostep-RELP   Level  2    SPM  50    Minutes  15    METs  2      Prescription Details   Frequency (times per week)  3    Duration  Progress to 45 minutes of aerobic exercise without signs/symptoms of physical distress      Intensity   THRR 40-80% of Max Heartrate  135-150    Ratings of Perceived Exertion  11-15    Perceived Dyspnea  0-4      Resistance Training   Training Prescription  Yes    Weight  3 lb    Reps  10-15       Perform Capillary Blood Glucose checks as needed.  Exercise Prescription Changes: Exercise  Prescription Changes  Row Name 04/18/18 1400 05/02/18 1200 05/16/18 1500 05/30/18 1200 06/13/18 1200     Response to Exercise   Blood Pressure (Admit)  126/62  110/68  140/60  138/78  120/66   Blood Pressure (Exercise)  134/84  -  -  -  -   Blood Pressure (Exit)  118/60  118/66  128/62  118/70  134/70   Heart Rate (Admit)  107 bpm  106 bpm  110 bpm  106 bpm  102 bpm   Heart Rate (Exercise)  122 bpm  106 bpm  125 bpm  117 bpm  125 bpm   Heart Rate (Exit)  108 bpm  105 bpm  112 bpm  110 bpm  107 bpm   Oxygen Saturation (Admit)  91 %  93 %  89 %  93 %  91 %   Oxygen Saturation (Exercise)  88 %  93 %  82 % up to 88 with PLB  90 %  89 %   Oxygen Saturation (Exit)  92 %  91 %  88 %  93 %  97 %   Rating of Perceived Exertion (Exercise)  17  14  14  11  14    Perceived Dyspnea (Exercise)  4  3  2  2  2    Symptoms  SOB  -  -  -  -   Comments  second full day of exercise  -  -  -  -   Duration  Progress to 45 minutes of aerobic exercise without signs/symptoms of physical distress  Continue with 45 min of aerobic exercise without signs/symptoms of physical distress.  Continue with 45 min of aerobic exercise without signs/symptoms of physical distress.  Continue with 45 min of aerobic exercise without signs/symptoms of physical distress.  Continue with 45 min of aerobic exercise without signs/symptoms of physical distress.   Intensity  THRR unchanged  THRR unchanged meeting RPE goals  THRR unchanged  THRR unchanged  THRR unchanged     Progression   Progression  Continue to progress workloads to maintain intensity without signs/symptoms of physical distress.  Continue to progress workloads to maintain intensity without signs/symptoms of physical distress.  Continue to progress workloads to maintain intensity without signs/symptoms of physical distress.  Continue to progress workloads to maintain intensity without signs/symptoms of physical distress.  Continue to progress workloads to maintain intensity  without signs/symptoms of physical distress.   Average METs  1.77  2.25  2.25  2.38  2.6     Resistance Training   Training Prescription  Yes  Yes  Yes  Yes  Yes   Weight  3 lb  2 lb  2 lb  2 lb  2 lb   Reps  10-15  10-15  10-15  10-15  10-15     Interval Training   Interval Training  No  No  No  No  No     Treadmill   MPH  1.3  -  1.5  1.5  1.6   Grade  0  -  0  0  0   Minutes  15  -  15  15  15    METs  2  -  2.15  2.15  2.23     NuStep   Level  2  2  2   -  2   SPM  84  80  80  -  80   Minutes  15  15  15   -  15  METs  2.3  2.5  2.4  -  2.5     Biostep-RELP   Level  2  2  2  2  3    SPM  50  50  50  50  50   Minutes  15  15  15  15  15    METs  1  2  2  2  3    Row Name 06/28/18 0800             Response to Exercise   Blood Pressure (Admit)  128/74       Blood Pressure (Exit)  126/62       Heart Rate (Admit)  100 bpm       Heart Rate (Exercise)  115 bpm       Heart Rate (Exit)  103 bpm       Oxygen Saturation (Admit)  91 %       Oxygen Saturation (Exercise)  94 %       Oxygen Saturation (Exit)  92 %       Rating of Perceived Exertion (Exercise)  13       Perceived Dyspnea (Exercise)  2       Duration  Continue with 45 min of aerobic exercise without signs/symptoms of physical distress.       Intensity  THRR unchanged         Progression   Progression  Continue to progress workloads to maintain intensity without signs/symptoms of physical distress.       Average METs  2.6         Resistance Training   Training Prescription  Yes       Weight  3 lb       Reps  10-15         Interval Training   Interval Training  No         Treadmill   MPH  1.5       Grade  0       Minutes  15       METs  2.5         NuStep   Level  2       SPM  80       Minutes  15       METs  2.2          Exercise Comments: Exercise Comments    Row Name 04/09/18 1027           Exercise Comments  First full day of exercise!  Patient was oriented to gym and equipment  including functions, settings, policies, and procedures.  Patient's individual exercise prescription and treatment plan were reviewed.  All starting workloads were established based on the results of the 6 minute walk test done at initial orientation visit.  The plan for exercise progression was also introduced and progression will be customized based on patient's performance and goals.          Exercise Goals and Review: Exercise Goals    Row Name 04/03/18 1300             Exercise Goals   Increase Physical Activity  Yes       Intervention  Provide advice, education, support and counseling about physical activity/exercise needs.;Develop an individualized exercise prescription for aerobic and resistive training based on initial evaluation findings, risk stratification, comorbidities and participant's personal goals.       Expected Outcomes  Short Term: Attend rehab  on a regular basis to increase amount of physical activity.;Long Term: Add in home exercise to make exercise part of routine and to increase amount of physical activity.;Long Term: Exercising regularly at least 3-5 days a week.       Increase Strength and Stamina  Yes       Intervention  Provide advice, education, support and counseling about physical activity/exercise needs.;Develop an individualized exercise prescription for aerobic and resistive training based on initial evaluation findings, risk stratification, comorbidities and participant's personal goals.       Expected Outcomes  Short Term: Increase workloads from initial exercise prescription for resistance, speed, and METs.;Short Term: Perform resistance training exercises routinely during rehab and add in resistance training at home;Long Term: Improve cardiorespiratory fitness, muscular endurance and strength as measured by increased METs and functional capacity ( )       Able to understand and use rate of perceived exertion (RPE) scale  Yes       Intervention  Provide  education and explanation on how to use RPE scale       Expected Outcomes  Short Term: Able to use RPE daily in rehab to express subjective intensity level;Long Term:  Able to use RPE to guide intensity level when exercising independently       Able to understand and use Dyspnea scale  Yes       Intervention  Provide education and explanation on how to use Dyspnea scale       Expected Outcomes  Short Term: Able to use Dyspnea scale daily in rehab to express subjective sense of shortness of breath during exertion;Long Term: Able to use Dyspnea scale to guide intensity level when exercising independently       Knowledge and understanding of Target Heart Rate Range (THRR)  Yes       Intervention  Provide education and explanation of THRR including how the numbers were predicted and where they are located for reference       Expected Outcomes  Short Term: Able to state/look up THRR;Short Term: Able to use daily as guideline for intensity in rehab;Long Term: Able to use THRR to govern intensity when exercising independently       Able to check pulse independently  Yes       Intervention  Provide education and demonstration on how to check pulse in carotid and radial arteries.;Review the importance of being able to check your own pulse for safety during independent exercise       Expected Outcomes  Short Term: Able to explain why pulse checking is important during independent exercise;Long Term: Able to check pulse independently and accurately       Understanding of Exercise Prescription  Yes       Intervention  Provide education, explanation, and written materials on patient's individual exercise prescription       Expected Outcomes  Short Term: Able to explain program exercise prescription;Long Term: Able to explain home exercise prescription to exercise independently          Exercise Goals Re-Evaluation : Exercise Goals Re-Evaluation    Row Name 04/09/18 1027 04/18/18 1450 05/02/18 1232 05/16/18  1508 06/13/18 1252     Exercise Goal Re-Evaluation   Exercise Goals Review  Understanding of Exercise Prescription;Able to understand and use Dyspnea scale;Knowledge and understanding of Target Heart Rate Range (THRR);Able to understand and use rate of perceived exertion (RPE) scale  Increase Physical Activity;Increase Strength and Stamina;Understanding of Exercise Prescription  Increase Physical Activity;Able to understand  and use rate of perceived exertion (RPE) scale;Increase Strength and Stamina;Knowledge and understanding of Target Heart Rate Range (THRR)  Increase Physical Activity;Able to understand and use rate of perceived exertion (RPE) scale;Increase Strength and Stamina;Able to understand and use Dyspnea scale  Increase Physical Activity;Increase Strength and Stamina;Able to understand and use rate of perceived exertion (RPE) scale;Able to understand and use Dyspnea scale;Knowledge and understanding of Target Heart Rate Range (THRR);Understanding of Exercise Prescription   Comments  Reviewed RPE scale, THR and program prescription with pt today.  Pt voiced understanding and was given a copy of goals to take home.   Stacie [name] has completed two full days of exercise.  She did have to decrease her treadmill speed to 1.3 mph.  We will continue to monitor her progression.   Stacie [name] is making small progressions with exercise.  Staff will monitor progress.  Cathy had added speed to TM and increased overall MET level.  Staff will monitor progress.  Stacie [name] is progressing well and has increased overall MET level.     Expected Outcomes  Short: Use RPE daily to regulate intensity.  Long: Follow program prescription in THR.  Short: Continue to attend regularly. Long: Continue to follow prescription.   Short - increase level on Biostep Long - increase overall MET level   Short - increase weight strength training  Long - increase MET level   Short - continue to attend LW consistently Long - Increase overall MET level and  maintain on her own   Row Name 06/22/18 1017 06/28/18 0827           Exercise Goal Re-Evaluation   Exercise Goals Review  Increase Physical Activity;Increase Strength and Stamina;Understanding of Exercise Prescription  Increase Physical Activity;Increase Strength and Stamina;Able to understand and use rate of perceived exertion (RPE) scale;Able to understand and use Dyspnea scale;Knowledge and understanding of Target Heart Rate Range (THRR);Understanding of Exercise Prescription      Comments  Stacie [name] is doing well with rehab. She has been doing her home exercise some. She has been doing weights and stairs. She has also used her husbands elliptical a little bit.  She was really good [place] walking [place] and is looking forward to the cooler weather to walk outside.   Stacie [name] attends consistently and has noticed imporvement in her ADLs.  She increases speed on TM as long as 02 stays above 88.        Expected Outcomes  Short: Increase walking time at home to 30 min.  Long: Continue to increase physical activity levels.   Short - complete LW program Long - maintain increased fitness on her own         Discharge Exercise Prescription (Final Exercise Prescription Changes): Exercise Prescription Changes - 06/28/18 0800      Response to Exercise   Blood Pressure (Admit)  128/74    Blood Pressure (Exit)  126/62    Heart Rate (Admit)  100 bpm    Heart Rate (Exercise)  115 bpm    Heart Rate (Exit)  103 bpm    Oxygen Saturation (Admit)  91 %    Oxygen Saturation (Exercise)  94 %    Oxygen Saturation (Exit)  92 %    Rating of Perceived Exertion (Exercise)  13    Perceived Dyspnea (Exercise)  2    Duration  Continue with 45 min of aerobic exercise without signs/symptoms of physical distress.    Intensity  THRR unchanged      Progression  Progression  Continue to progress workloads to maintain intensity without signs/symptoms of physical distress.    Average METs  2.6      Resistance  Training   Training Prescription  Yes    Weight  3 lb    Reps  10-15      Interval Training   Interval Training  No      Treadmill   MPH  1.5    Grade  0    Minutes  15    METs  2.5      NuStep   Level  2    SPM  80    Minutes  15    METs  2.2       Nutrition:  Target Goals: Understanding of nutrition guidelines, daily intake of sodium 1500mg , cholesterol 200mg , calories 30% from fat and 7% or less from saturated fats, daily to have 5 or more servings of fruits and vegetables.  Biometrics: Pre Biometrics - 04/03/18 1259      Pre Biometrics   Height  5' 6.5" (1.689 m)    Weight  198 lb 11.2 oz (90.1 kg)    Waist Circumference  39.5 inches    Hip Circumference  47.75 inches    Waist to Hip Ratio  0.83 %    BMI (Calculated)  31.59        Nutrition Therapy Plan and Nutrition Goals: Nutrition Therapy & Goals - 05/07/18 1101      Nutrition Therapy   Diet  TLC    Protein (specify units)  8oz    Fiber  25 grams    [employer]  3 servings    Saturated Fats  12 max. grams    Fruits and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

## 2018-07-09 NOTE — Progress Notes (Signed)
Daily Session Note  Patient Details  Name: Stacie [name] MRN: [id number] Date of Birth: 01-25-1956 Referring Provider:     Pulmonary Rehab from 04/03/2018 in [facility] Cardiac and Pulmonary Rehab  Referring Provider  [place]      Encounter Date: 07/09/2018  Check In: Session Check In - 07/09/18 1027      Check-In   Supervising physician immediately available to respond to emergencies  [name] immediately available ER MD    Physician(s)  Dr. Jimmye Norman and Dr. Corky Downs    Location  [facility]-Cardiac & Pulmonary Rehab    Staff Present  Justin Mend RCP,RRT,BSRT;Amanda Oletta Darter, [place], ACSM CEP, Exercise Physiologist;Jeremias Broyhill Amedeo Plenty, BS, ACSM CEP, Exercise Physiologist    Medication changes reported      No    Fall or balance concerns reported     No    Tobacco Cessation  No Change    Warm-up and Cool-down  Performed as group-led instruction    Resistance Training Performed  Yes    VAD Patient?  No    PAD/SET Patient?  No      Pain Assessment   Currently in Pain?  No/denies    Multiple Pain Sites  No          Social History   Tobacco Use  Smoking Status Never Smoker  Smokeless Tobacco Never Used    Goals Met:  Proper associated with RPD/PD & O2 Sat Independence with exercise equipment Exercise tolerated well No report of cardiac concerns or symptoms Strength training completed today  Goals Unmet:  Not Applicable  Comments: Pt able to follow exercise prescription today without complaint.  Will continue to monitor for progression.    Dr. Emily Filbert is Medical Director for [place] and [name].

## 2018-07-11 ENCOUNTER — Ambulatory Visit
Admission: RE | Admit: 2018-07-11 | Discharge: 2018-07-11 | Disposition: A | Discharge status: Home / Self Care | Payer: BLUE CROSS/BLUE SHIELD | Source: Ambulatory Visit | Attending: Internal Medicine | Admitting: Internal Medicine

## 2018-07-11 DIAGNOSIS — D860 Sarcoidosis of lung: Secondary | ICD-10-CM | POA: Diagnosis not present

## 2018-07-11 DIAGNOSIS — R0602 Shortness of breath: Secondary | ICD-10-CM | POA: Diagnosis not present

## 2018-07-11 DIAGNOSIS — Z1231 Encounter for screening mammogram for malignant neoplasm of breast: Secondary | ICD-10-CM | POA: Insufficient documentation

## 2018-07-11 NOTE — Progress Notes (Signed)
Daily Session Note  Patient Details  Name: Stacie [name] MRN: [id number] Date of Birth: [birth year] Referring Provider:     Pulmonary Rehab from 04/03/2018 in [facility] Cardiac and Pulmonary Rehab  Referring Provider  [place]      Encounter Date: 07/11/2018  Check In: Session Check In - 07/11/18 1027      Check-In   Supervising physician immediately available to respond to emergencies  [name] immediately available ER MD    Physician(s)  Jimmye Norman and Darl Householder    Location  [facility]-Cardiac & Pulmonary Rehab    Staff Present  Alberteen Sam, MA, RCEP, CCRP, Exercise Physiologist;Joseph Foy [place], [place], ACSM CEP, Exercise Physiologist    Medication changes reported      No    Fall or balance concerns reported     No    Warm-up and Cool-down  Performed as group-led instruction    Resistance Training Performed  Yes    VAD Patient?  No    PAD/SET Patient?  No      Pain Assessment   Currently in Pain?  No/denies    Multiple Pain Sites  No          Social History   Tobacco Use  Smoking Status Never Smoker  Smokeless Tobacco Never Used    Goals Met:  Proper associated with RPD/PD & O2 Sat Independence with exercise equipment Exercise tolerated well Strength training completed today  Goals Unmet:  Not Applicable  Comments: Pt able to follow exercise prescription today without complaint.  Will continue to monitor for progression.    Dr. Emily Filbert is Medical Director for [place] and [name].

## 2018-07-13 DIAGNOSIS — D860 Sarcoidosis of lung: Secondary | ICD-10-CM

## 2018-07-13 DIAGNOSIS — R0602 Shortness of breath: Secondary | ICD-10-CM | POA: Diagnosis not present

## 2018-07-13 NOTE — Progress Notes (Signed)
Daily Session Note  Patient Details  Name: Stacie [name] MRN: [id number] Date of Birth: [birth year] Referring Provider:     Pulmonary Rehab from 04/03/2018 in [facility] Cardiac and Pulmonary Rehab  Referring Provider  Rack[place]      Encounter Date: 07/13/2018  Check In: Session Check In - 07/13/18 0950      Check-In   Supervising physician immediately available to respond to emergencies  [name] immediately available ER MD    Physician(s)  Dr. Corky Downs and Clearnce Hasten    Location  [facility]Cardiac & Pulmonary Rehab    Staff Present  Justin Mend RCP,RRT,BSRT;Amanda Oletta Darter, [place], ACSM CEP, Exercise Physiologist;Meredith Sherryll Burger, RN BSN    Medication changes reported      No    Fall or balance concerns reported     No    Warm-up and Cool-down  Performed on first and last piece of equipment    Resistance Training Performed  Yes    VAD Patient?  No    PAD/SET Patient?  No      Pain Assessment   Currently in Pain?  No/denies          Social History   Tobacco Use  Smoking Status Never Smoker  Smokeless Tobacco Never Used    Goals Met:  Independence with exercise equipment Exercise tolerated well No report of cardiac concerns or symptoms Strength training completed today  Goals Unmet:  Not Applicable  Comments: Pt able to follow exercise prescription today without complaint.  Will continue to monitor for progression.    Dr. Emily Filbert is Medical Director for [place] and [name].

## 2018-07-16 ENCOUNTER — Encounter: Payer: BLUE CROSS/BLUE SHIELD | Admitting: *Deleted

## 2018-07-16 DIAGNOSIS — R0602 Shortness of breath: Secondary | ICD-10-CM | POA: Diagnosis not present

## 2018-07-16 DIAGNOSIS — D860 Sarcoidosis of lung: Secondary | ICD-10-CM | POA: Diagnosis not present

## 2018-07-16 NOTE — Progress Notes (Signed)
Daily Session Note  Patient Details  Name: Stacie [name] MRN: [id number] Date of Birth: Dec 29, 1955 Referring Provider:     Pulmonary Rehab from 04/03/2018 in [facility] Cardiac and Pulmonary Rehab  Referring Provider  [place]      Encounter Date: 07/16/2018  Check In: Session Check In - 07/16/18 1013      Check-In   Supervising physician immediately available to respond to emergencies  [name] immediately available ER MD    Physician(s)  Drs. Dallas Breeding    Location  [facility]-Cardiac & Pulmonary Rehab    Staff Present  Justin Mend RCP,RRT,BSRT;Amanda Oletta Darter, [place], ACSM CEP, Exercise Physiologist;Trinten Boudoin Amedeo Plenty, BS, ACSM CEP, Exercise Physiologist    Medication changes reported      No    Fall or balance concerns reported     No    Tobacco Cessation  No Change    Warm-up and Cool-down  Performed as group-led instruction    Resistance Training Performed  Yes    VAD Patient?  No    PAD/SET Patient?  No      Pain Assessment   Currently in Pain?  No/denies    Multiple Pain Sites  No          Social History   Tobacco Use  Smoking Status Never Smoker  Smokeless Tobacco Never Used    Goals Met:  Proper associated with RPD/PD & O2 Sat Independence with exercise equipment Exercise tolerated well No report of cardiac concerns or symptoms Strength training completed today  Goals Unmet:  Not Applicable  Comments: Pt able to follow exercise prescription today without complaint.  Will continue to monitor for progression.    Dr. Emily Filbert is Medical Director for [place] and [name].

## 2018-07-18 ENCOUNTER — Encounter: Payer: BLUE CROSS/BLUE SHIELD | Attending: Internal Medicine | Admitting: *Deleted

## 2018-07-18 DIAGNOSIS — D860 Sarcoidosis of lung: Secondary | ICD-10-CM | POA: Diagnosis not present

## 2018-07-18 DIAGNOSIS — R0602 Shortness of breath: Secondary | ICD-10-CM | POA: Insufficient documentation

## 2018-07-18 NOTE — Progress Notes (Signed)
Daily Session Note  Patient Details  Name: Stacie [name] MRN: [id number] Date of Birth: [birth year] Referring Provider:     Pulmonary Rehab from 04/03/2018 in [facility] Cardiac and Pulmonary Rehab  Referring Provider  Rack[place]      Encounter Date: 07/18/2018  Check In: Session Check In - 07/18/18 0949      Check-In   Supervising physician immediately available to respond to emergencies  [name] immediately available ER MD    Physician(s)  Drs. Joni Fears and [place]    Location  [facility]-Cardiac & Pulmonary Rehab    Staff Present  Alberteen Sam, MA, RCEP, CCRP, Exercise Physiologist;Joseph [employer], [place], ACSM CEP, Exercise Physiologist    Medication changes reported      No    Fall or balance concerns reported     No    Warm-up and Cool-down  Performed as group-led instruction    Resistance Training Performed  Yes    VAD Patient?  No    PAD/SET Patient?  No      Pain Assessment   Currently in Pain?  No/denies          Social History   Tobacco Use  Smoking Status Never Smoker  Smokeless Tobacco Never Used    Goals Met:  Proper associated with RPD/PD & O2 Sat Independence with exercise equipment Using PLB without cueing & demonstrates good technique Exercise tolerated well No report of cardiac concerns or symptoms Strength training completed today  Goals Unmet:  Not Applicable  Comments: Pt able to follow exercise prescription today without complaint.  Will continue to monitor for progression.    Dr. Emily Filbert is Medical Director for [place] and [name].

## 2018-07-20 ENCOUNTER — Encounter: Payer: BLUE CROSS/BLUE SHIELD | Admitting: *Deleted

## 2018-07-20 VITALS — Ht 66.5 in | Wt 198.7 lb

## 2018-07-20 DIAGNOSIS — D860 Sarcoidosis of lung: Secondary | ICD-10-CM | POA: Diagnosis not present

## 2018-07-20 DIAGNOSIS — R0602 Shortness of breath: Secondary | ICD-10-CM | POA: Diagnosis not present

## 2018-07-20 NOTE — Progress Notes (Signed)
Daily Session Note  Patient Details  Name: Stacie [name] MRN: [id number] Date of Birth: [birth year] Referring Provider:     Pulmonary Rehab from 04/03/2018 in [facility] Cardiac and Pulmonary Rehab  Referring Provider  [place]      Encounter Date: 07/20/2018  Check In: Session Check In - 07/20/18 1012      Check-In   Supervising physician immediately available to respond to emergencies  [name] immediately available ER MD    Physician(s)  Dr. Reita Cliche and Alfred Levins    Location  [facility]-Cardiac & Pulmonary Rehab    Staff Present  Renita Papa, RN BSN;Nana Addai, RN Vickki Hearing, BA, ACSM CEP, Exercise Physiologist    Medication changes reported      No    Fall or balance concerns reported     No    Tobacco Cessation  No Change    Warm-up and Cool-down  Performed as group-led instruction    Resistance Training Performed  Yes    VAD Patient?  No    PAD/SET Patient?  No      Pain Assessment   Currently in Pain?  No/denies          Social History   Tobacco Use  Smoking Status Never Smoker  Smokeless Tobacco Never Used    Goals Met:  Proper associated with RPD/PD & O2 Sat Independence with exercise equipment Using PLB without cueing & demonstrates good technique Exercise tolerated well No report of cardiac concerns or symptoms Strength training completed today  Goals Unmet:  Not Applicable  Comments: Pt able to follow exercise prescription today without complaint.  Will continue to monitor for progression.    Dr. Emily Filbert is Medical Director for [place] and [name].

## 2018-07-23 DIAGNOSIS — D860 Sarcoidosis of lung: Secondary | ICD-10-CM | POA: Diagnosis not present

## 2018-07-23 DIAGNOSIS — R0602 Shortness of breath: Secondary | ICD-10-CM | POA: Diagnosis not present

## 2018-07-23 NOTE — Progress Notes (Signed)
Daily Session Note  Patient Details  Name: Stacie [name] MRN: [id number] Date of Birth: 1956/08/02 Referring Provider:     Pulmonary Rehab from 04/03/2018 in [facility] Cardiac and Pulmonary Rehab  Referring Provider  [place]      Encounter Date: 07/23/2018  Check In: Session Check In - 07/23/18 [place]      Check-In   Supervising physician immediately available to respond to emergencies  [name] immediately available ER MD    Physician(s)  Dr. Corky Downs and Quentin Cornwall    Location  [facility]Cardiac & Pulmonary Rehab    Staff Present  Justin Mend RCP,RRT,BSRT;Amanda Oletta Darter, [place], ACSM CEP, Exercise Physiologist;Kelly Amedeo Plenty, BS, ACSM CEP, Exercise Physiologist    Medication changes reported      No    Fall or balance concerns reported     No    Warm-up and Cool-down  Performed as group-led instruction    Resistance Training Performed  Yes    VAD Patient?  No    PAD/SET Patient?  No      Pain Assessment   Currently in Pain?  No/denies          Social History   Tobacco Use  Smoking Status Never Smoker  Smokeless Tobacco Never Used    Goals Met:  Independence with exercise equipment Exercise tolerated well No report of cardiac concerns or symptoms Strength training completed today  Goals Unmet:  Not Applicable  Comments: Pt able to follow exercise prescription today without complaint.  Will continue to monitor for progression.    Dr. Emily Filbert is Medical Director for [place] and [name].

## 2018-07-24 ENCOUNTER — Ambulatory Visit (INDEPENDENT_AMBULATORY_CARE_PROVIDER_SITE_OTHER): Payer: BLUE CROSS/BLUE SHIELD | Admitting: Internal Medicine

## 2018-07-24 ENCOUNTER — Encounter: Payer: Self-pay | Admitting: Internal Medicine

## 2018-07-24 VITALS — BP 124/62 | HR 92 | Temp 98.3°F | Ht 66.5 in | Wt 181.0 lb

## 2018-07-24 DIAGNOSIS — R197 Diarrhea, unspecified: Secondary | ICD-10-CM

## 2018-07-24 DIAGNOSIS — G4733 Obstructive sleep apnea (adult) (pediatric): Secondary | ICD-10-CM | POA: Diagnosis not present

## 2018-07-24 MED ORDER — METRONIDAZOLE 500 MG PO TABS: 500.0000 mg | ORAL_TABLET | Freq: Two times a day (BID) | ORAL | 0 refills | Status: DC

## 2018-07-24 MED ORDER — METRONIDAZOLE 500 MG PO TABS: 500.0000 mg | ORAL_TABLET | Freq: Three times a day (TID) | ORAL | 0 refills | Status: DC

## 2018-07-24 NOTE — Progress Notes (Signed)
Date:  07/24/2018   Name:  Stacie [name]   DOB:  1955/12/09   MRN:  [id number]   Chief Complaint: Fever (Low grade fever and diarrhea for 4 days. No cough, body aches or headaches. )  Diarrhea   This is a new problem. The current episode started in the past 7 days. The problem occurs 2 to 4 times per day. The problem has been unchanged. Diarrhea characteristics: soft and not formed. The patient states that diarrhea does not awaken her from sleep. Associated symptoms include a fever. Pertinent negatives include no abdominal pain, chills, headaches or vomiting. Nothing aggravates the symptoms. Risk factors include recent antibiotic use.    Review of Systems  Constitutional: Positive for fever. Negative for chills and fatigue.  Respiratory: Positive for chest tightness, shortness of breath and wheezing.   Cardiovascular: Negative for chest pain and palpitations.  Gastrointestinal: Positive for diarrhea and nausea. Negative for abdominal pain, blood in stool and vomiting.  Genitourinary: Negative for dysuria.  Neurological: Negative for dizziness and headaches.    Patient Active Problem List   Diagnosis Date Noted  . Mood disorder ([place]) 06/05/2018  . Compression fx, thoracic spine ([place]) 12/04/2017  . Morbid obesity due to excess calories ([place]) 05/08/2017  . Hyperlipidemia, mixed 04/21/2017  . Elevated blood pressure reading 04/20/2017  . Cough variant asthma  vs UACS  02/20/2017  . Hypothyroidism, acquired 03/27/2015  . Essential hypertension 03/27/2015  . H/O abnormal cervical Papanicolaou smear 03/27/2015  . H/O renal calculi 03/27/2015  . Allergic rhinitis 01/07/2015  . Obstructive sleep apnea 09/29/2014  . Other malaise and fatigue 06/03/2014  . Asthma, chronic 02/24/2014  . Cough 04/30/2013  . Pulmonary sarcoidosis ([place]) 03/12/2013  . CKD (chronic kidney disease) stage 3, GFR 30-59 ml/min (HCC) 03/12/2013  . Calcium blood increased 05/23/2012  . Besnier-Boeck disease  02/08/2011    Allergies  Allergen Reactions  . Nitrofurantoin Nausea Only  . Tramadol     Nausea and vomiting  . Sulfa Antibiotics Rash    As an infant    Past Surgical History:  Procedure Laterality Date  . COLON SURGERY     "colon was fused to bladder - operated on both"  . PARS PLANA VITRECTOMY Right 05/20/2015   Procedure: PARS PLANA VITRECTOMY WITH 25 GAUGE, laser;  Surgeon: Milus Height, MD;  Location: [facility] ORS;  Service: Ophthalmology;  Laterality: Right;  . PARTIAL HYSTERECTOMY  1990  . TUBAL LIGATION      Social History   Tobacco Use  . Smoking status: Never Smoker  . Smokeless tobacco: Never Used  Substance Use Topics  . Alcohol use: No  . Drug use: No     Medication list has been reviewed and updated.  Current Meds  Medication Sig  . Acetaminophen-Codeine (TYLENOL/CODEINE #3) 300-30 MG tablet Take 1 tablet by mouth every 4 (four) hours as needed for pain.  Marland Kitchen albuterol (VENTOLIN HFA) 108 (90 Base) MCG/ACT inhaler Inhale 2 puffs into the lungs every 6 (six) hours as needed for wheezing.  Marland Kitchen amLODipine (NORVASC) 5 MG tablet Take 1 tablet (5 mg total) by mouth daily.  . budesonide-formoterol (SYMBICORT) 80-4.5 MCG/ACT inhaler Take 2 puffs first thing in am and then another 2 puffs about 12 hours later.  . cetirizine (ZYRTEC) 10 MG tablet Take 10 mg by mouth daily as needed.   Marland Kitchen escitalopram (LEXAPRO) 10 MG tablet TAKE 1 TABLET BY MOUTH EVERY DAY  . FORTEO 600 MCG/2.4ML SOLN   . GLUCOSAMINE  PO Take 1 tablet by mouth daily.  Marland Kitchen HYDROcodone-acetaminophen (NORCO/VICODIN) 5-325 MG tablet Take 1 tablet by mouth every 4 (four) hours as needed.  . mometasone (NASONEX) 50 MCG/ACT nasal spray Place 1 spray into the nose 2 (two) times daily.  . Multiple Vitamin (MULTIVITAMIN) capsule Take 1 capsule by mouth 2 (two) times daily.   . NON FORMULARY CPAP nightly  . predniSONE (DELTASONE) 10 MG tablet Take 7.5 mg by mouth.   . Respiratory Therapy Supplies (FLUTTER) DEVI Use  as directed  . SYNTHROID 137 MCG tablet TAKE 1 TABLET (137 MCG TOTAL) BY MOUTH DAILY BEFORE BREAKFAST.    PHQ 2/9 Scores 07/13/2018 06/21/2018 04/03/2018 04/02/2018  PHQ - 2 Score 0 0 4 2  PHQ- 9 Score _0 Physical Exam  Constitutional: She is oriented to person, place, and time. She appears well-developed. No distress.  HENT:  Head: Normocephalic and atraumatic.  Cardiovascular: Normal rate, regular rhythm and normal heart sounds.  Pulmonary/Chest: Accessory muscle usage present. No respiratory distress. She has wheezes. She has no rhonchi.  Abdominal: Soft. Bowel sounds are normal. She exhibits no distension and no mass. There is tenderness. There is no guarding.  Musculoskeletal: Normal range of motion.  Neurological: She is alert and oriented to person, place, and time.  Skin: Skin is warm and dry. No rash noted.  Psychiatric: She has a normal mood and affect. Her behavior is normal. Thought content normal.  Nursing note and vitals reviewed.   BP 124/62 (BP Location: Right Arm, Patient Position: Sitting, Cuff Size: Normal)   Pulse 92   Temp 98.3 F (36.8 C) (Oral)   Ht 5' 6.5" (1.689 m)   Wt 181 lb (82.1 kg)   SpO2 (!) 87%   BMI 28.78 kg/m   Assessment and Plan: 1. Diarrhea of presumed infectious origin Continue fluids, probiotics Return if needed - metroNIDAZOLE (FLAGYL) 500 MG tablet; Take 1 tablet (500 mg total) by mouth 2 (two) times daily.  Dispense: 14 tablet; Refill: 0   Partially dictated using Editor, commissioning. Any errors are unintentional.  Halina Maidens, MD [facility]  07/24/2018

## 2018-07-25 DIAGNOSIS — D860 Sarcoidosis of lung: Secondary | ICD-10-CM | POA: Diagnosis not present

## 2018-07-25 DIAGNOSIS — R0602 Shortness of breath: Secondary | ICD-10-CM | POA: Diagnosis not present

## 2018-07-25 NOTE — Progress Notes (Signed)
Daily Session Note  Patient Details  Name: GENNY CAULDER MRN: [id number] Date of Birth: 1956-09-12 Referring Provider:     Pulmonary Rehab from 04/03/2018 in [facility] Cardiac and Pulmonary Rehab  Referring Provider  [place]      Encounter Date: 07/25/2018  Check In: Session Check In - 07/25/18 1051      Check-In   Supervising physician immediately available to respond to emergencies  [name] immediately available ER MD    Location  [facility]-Cardiac & Pulmonary Rehab    Staff Present  Alberteen Sam, MA, RCEP, CCRP, Exercise Physiologist;Joseph Foy [place], [place], ACSM CEP, Exercise Physiologist    Medication changes reported      No    Fall or balance concerns reported     No    Warm-up and Cool-down  Performed as group-led instruction    Resistance Training Performed  Yes    VAD Patient?  No    PAD/SET Patient?  No      Pain Assessment   Currently in Pain?  No/denies    Multiple Pain Sites  No          Social History   Tobacco Use  Smoking Status Never Smoker  Smokeless Tobacco Never Used    Goals Met:  Proper associated with RPD/PD & O2 Sat Independence with exercise equipment Exercise tolerated well Strength training completed today  Goals Unmet:  Not Applicable  Comments: Pt able to follow exercise prescription today without complaint.  Will continue to monitor for progression.    Dr. Emily Filbert is Medical Director for [place] and [name].

## 2018-07-30 DIAGNOSIS — R0602 Shortness of breath: Secondary | ICD-10-CM | POA: Diagnosis not present

## 2018-07-30 DIAGNOSIS — R634 Abnormal weight loss: Secondary | ICD-10-CM | POA: Diagnosis not present

## 2018-07-30 DIAGNOSIS — Z79899 Other long term (current) drug therapy: Secondary | ICD-10-CM | POA: Diagnosis not present

## 2018-07-30 DIAGNOSIS — D869 Sarcoidosis, unspecified: Secondary | ICD-10-CM | POA: Diagnosis not present

## 2018-07-30 DIAGNOSIS — D860 Sarcoidosis of lung: Secondary | ICD-10-CM | POA: Diagnosis not present

## 2018-08-01 DIAGNOSIS — D860 Sarcoidosis of lung: Secondary | ICD-10-CM | POA: Diagnosis not present

## 2018-08-01 DIAGNOSIS — R0602 Shortness of breath: Secondary | ICD-10-CM | POA: Diagnosis not present

## 2018-08-01 NOTE — Progress Notes (Signed)
Daily Session Note  Patient Details  Name: Stacie [name] MRN: [id number] Date of Birth: 03-Jun-1956 Referring Provider:     Pulmonary Rehab from 04/03/2018 in [facility] Cardiac and Pulmonary Rehab  Referring Provider  Rack[place]      Encounter Date: 08/01/2018  Check In: Session Check In - 08/01/18 4818      Check-In   Supervising physician immediately available to respond to emergencies  [name] immediately available ER MD    Physician(s)  Dr. Burlene Arnt and Jimmye Norman    Location  [facility]-Cardiac & Pulmonary Rehab    Staff Present  Alberteen Sam, MA, RCEP, CCRP, Exercise Physiologist;Joseph [facility], [place], ACSM CEP, Exercise Physiologist    Medication changes reported      No    Fall or balance concerns reported     No    Warm-up and Cool-down  Performed as group-led instruction    Resistance Training Performed  Yes    VAD Patient?  No    PAD/SET Patient?  No      Pain Assessment   Currently in Pain?  No/denies          Social History   Tobacco Use  Smoking Status Never Smoker  Smokeless Tobacco Never Used    Goals Met:  Independence with exercise equipment Exercise tolerated well No report of cardiac concerns or symptoms Strength training completed today  Goals Unmet:  Not Applicable  Comments: Pt able to follow exercise prescription today without complaint.  Will continue to monitor for progression. [place] Name 04/03/18 1301 07/20/18 1103 08/01/18 1047     6 Minute Walk   Phase  -  Discharge  Discharge   Distance  786 feet  600 feet  425 feet   Walk Time  4.9 minutes  3 minutes  2.75 minutes   # of Rest Breaks  1  0  0   MPH  1.82  -  -   METS  2.96  2.25  -   RPE  _0 Perceived Dyspnea   _1 VO2 Peak  10.4  7.9  -   Symptoms  No  Yes (comment)  Yes (comment)   Comments  -  o2 dropped to 78 - bad breathing day due to weather  Saturation dropped to 79% again.  We will use 600 ft as post test results   Resting  HR  121 bpm  130 bpm  97 bpm   Resting BP  128/66  124/64  130/66   Resting Oxygen Saturation   92 %  91 %  90 %   Exercise Oxygen Saturation  during 6 min walk  82 %  78 %  79 %   Max Ex. HR  133 bpm  131 bpm  111 bpm   Max Ex. BP  176/72  -  152/84   2 Minute Post BP  140/76  -  -     Interval HR   1 Minute HR  124  -  105   2 Minute HR  124  -  110   3 Minute HR  126  -  111   4 Minute HR  129  -  -   5 Minute HR  133  -  -   6 Minute HR  130  -  -   2 Minute Post HR  123  -  -  Interval Heart Rate?  Yes  -  Yes     Interval Oxygen   Interval Oxygen?  Yes  -  Yes   Baseline Oxygen Saturation %  92 %  -  90 %   1 Minute Oxygen Saturation %  93 %  -  87 %   1 Minute Liters of Oxygen  0 L  -  0 L Room Air   2 Minute Oxygen Saturation %  89 %  -  82 %   2 Minute Liters of Oxygen  0 L  -  0 L   3 Minute Oxygen Saturation %  85 %  -  79 % Test stopped at 2:44   3 Minute Liters of Oxygen  0 L  -  0 L   4 Minute Oxygen Saturation %  82 %  -  -   4 Minute Liters of Oxygen  0 L  -  -   5 Minute Oxygen Saturation %  86 %  -  -   5 Minute Liters of Oxygen  0 L  -  -   6 Minute Oxygen Saturation %  86 %  -  -   6 Minute Liters of Oxygen  0 L  -  -   2 Minute Post Oxygen Saturation %  93 %  -  -       Dr. Emily Filbert is Medical Director for [place] and [name].

## 2018-08-03 DIAGNOSIS — D860 Sarcoidosis of lung: Secondary | ICD-10-CM | POA: Diagnosis not present

## 2018-08-03 DIAGNOSIS — R0602 Shortness of breath: Secondary | ICD-10-CM | POA: Diagnosis not present

## 2018-08-03 NOTE — Progress Notes (Signed)
Daily Session Note  Patient Details  Name: Stacie [name] MRN: [id number] Date of Birth: 10-19-1955 Referring Provider:     Pulmonary Rehab from 04/03/2018 in [facility] Cardiac and Pulmonary Rehab  Referring Provider  [place]      Encounter Date: 08/03/2018  Check In: Session Check In - 08/03/18 0949      Check-In   Supervising physician immediately available to respond to emergencies  [name] immediately available ER MD    Physician(s)  Dr. Quentin Cornwall and [facility]    Location  [facility]-Cardiac & Pulmonary Rehab    Staff Present  Justin Mend RCP,RRT,BSRT;Krista Frederico Hamman, RN BSN;Meredith Sherryll Burger, RN BSN    Medication changes reported      No    Fall or balance concerns reported     No    Warm-up and Cool-down  Performed as group-led Higher education careers adviser Performed  Yes    VAD Patient?  No    PAD/SET Patient?  No      Pain Assessment   Currently in Pain?  No/denies          Social History   Tobacco Use  Smoking Status Never Smoker  Smokeless Tobacco Never Used    Goals Met:  Independence with exercise equipment Exercise tolerated well No report of cardiac concerns or symptoms Strength training completed today  Goals Unmet:  Not Applicable  Comments: Pt able to follow exercise prescription today without complaint.  Will continue to monitor for progression.    Dr. Emily Filbert is Medical Director for [place] and [name].

## 2018-08-06 DIAGNOSIS — D860 Sarcoidosis of lung: Secondary | ICD-10-CM

## 2018-08-06 NOTE — Progress Notes (Signed)
Pulmonary Individual Treatment Plan  Patient Details  Name: Stacie [name] MRN: [id number] Date of Birth: 08/06/1956 Referring Provider:     Pulmonary Rehab from 04/03/2018 in [facility] Cardiac and Pulmonary Rehab  Referring Provider  [place]      Initial Encounter Date:    Pulmonary Rehab from 04/03/2018 in [facility] Cardiac and Pulmonary Rehab  Date  04/03/18      Visit Diagnosis: Pulmonary sarcoidosis ([place])  Patient's Home Medications on Admission:  Current Outpatient Medications:  .  Acetaminophen-Codeine (TYLENOL/CODEINE #3) 300-30 MG tablet, Take 1 tablet by mouth every 4 (four) hours as needed for pain., Disp: 42 tablet, Rfl: 0 .  albuterol (VENTOLIN HFA) 108 (90 Base) MCG/ACT inhaler, Inhale 2 puffs into the lungs every 6 (six) hours as needed for wheezing., Disp: 1 Inhaler, Rfl: 2 .  amLODipine (NORVASC) 5 MG tablet, Take 1 tablet (5 mg total) by mouth daily., Disp: 90 tablet, Rfl: 1 .  budesonide-formoterol (SYMBICORT) 80-4.5 MCG/ACT inhaler, Take 2 puffs first thing in am and then another 2 puffs about 12 hours later., Disp: 1 Inhaler, Rfl: 11 .  cetirizine (ZYRTEC) 10 MG tablet, Take 10 mg by mouth daily as needed. , Disp: , Rfl:  .  escitalopram (LEXAPRO) 10 MG tablet, TAKE 1 TABLET BY MOUTH EVERY DAY, Disp: 90 tablet, Rfl: 1 .  FORTEO 600 MCG/2.4ML SOLN, , Disp: , Rfl:  .  GLUCOSAMINE PO, Take 1 tablet by mouth daily., Disp: , Rfl:  .  HYDROcodone-acetaminophen (NORCO/VICODIN) 5-325 MG tablet, Take 1 tablet by mouth every 4 (four) hours as needed., Disp: , Rfl: 0 .  metroNIDAZOLE (FLAGYL) 500 MG tablet, Take 1 tablet (500 mg total) by mouth 3 (three) times daily., Disp: 21 tablet, Rfl: 0 .  mometasone (NASONEX) 50 MCG/ACT nasal spray, Place 1 spray into the nose 2 (two) times daily., Disp: , Rfl:  .  Multiple Vitamin (MULTIVITAMIN) capsule, Take 1 capsule by mouth 2 (two) times daily. , Disp: , Rfl:  .  NON FORMULARY, CPAP nightly, Disp: , Rfl:  .  predniSONE (DELTASONE) 10 MG  tablet, Take 7.5 mg by mouth. , Disp: , Rfl:  .  Respiratory Therapy Supplies (FLUTTER) DEVI, Use as directed, Disp: 1 each, Rfl: 0 .  SYNTHROID 137 MCG tablet, TAKE 1 TABLET (137 MCG TOTAL) BY MOUTH DAILY BEFORE BREAKFAST., Disp: 90 tablet, Rfl: 1  Past Medical History: Past Medical History:  Diagnosis Date  . Arrhythmia    patient unaware if this is current  . Asthma   . HOH (hard of hearing)    wear aids  . Hypothyroidism   . IBS (irritable bowel syndrome)   . Sarcoid   . Sarcoidosis   . Seasonal allergies   . Sleep apnea CPAP with O2    Tobacco Use: Social History   Tobacco Use  Smoking Status Never Smoker  Smokeless Tobacco Never Used    Labs: Recent Review Scientist, physiological    Labs for ITP Cardiac and Pulmonary Rehab Latest Ref Rng & Units 04/20/2017   Cholestrol 100 - 199 mg/dL 230(H)   LDLCALC 0 - 99 mg/dL 127(H)   HDL >39 mg/dL 54   Trlycerides 0 - 149 mg/dL 246(H)   Hemoglobin A1c 4.8 - 5.6 % 6.0(H)       Pulmonary Assessment Scores: Pulmonary Assessment Scores    Row Name 04/03/18 1108 07/13/18 1151       ADL UCSD   ADL Phase  Entry  Exit    SOB Score  total  80  53    Rest  2  1    Walk  3  3    Stairs  5  4    Bath  4  3    Dress  3  3    Shop  4  3      CAT Score   CAT Score  22  14      mMRC Score   mMRC Score  3  -       Pulmonary Function Assessment: Pulmonary Function Assessment - 04/03/18 1112      Breath   Bilateral Breath Sounds  Wheezes;Decreased   slight wheezes in LLL   Shortness of Breath  Yes;Limiting activity       Exercise Target Goals: Exercise Program Goal: Individual exercise prescription set using results from initial 6 min walk test and THRR while considering  patient's activity barriers and safety.   Exercise Prescription Goal: Initial exercise prescription builds to 30-45 minutes a day of aerobic activity, 2-3 days per week.  Home exercise guidelines will be given to patient during program as part of exercise  prescription that the participant will acknowledge.  Activity Barriers & Risk Stratification:   6 Minute Walk: 6 Minute Walk    Row Name 04/03/18 1301 07/20/18 1103 08/01/18 1047     6 Minute Walk   Phase  -  Discharge  Discharge   Distance  786 feet  600 feet  425 feet   Walk Time  4.9 minutes  3 minutes  2.75 minutes   # of Rest Breaks  1  0  0   MPH  1.82  -  -   METS  2.96  2.25  -   RPE  _0 Perceived Dyspnea   _1 VO2 Peak  10.4  7.9  -   Symptoms  No  Yes (comment)  Yes (comment)   Comments  -  o2 dropped to 78 - bad breathing day due to weather  Saturation dropped to 79% again.  We will use 600 ft as post test results   Resting HR  121 bpm  130 bpm  97 bpm   Resting BP  128/66  124/64  130/66   Resting Oxygen Saturation   92 %  91 %  90 %   Exercise Oxygen Saturation  during 6 min walk  82 %  78 %  79 %   Max Ex. HR  133 bpm  131 bpm  111 bpm   Max Ex. BP  176/72  -  152/84   2 Minute Post BP  140/76  -  -     Interval HR   1 Minute HR  124  -  105   2 Minute HR  124  -  110   3 Minute HR  126  -  111   4 Minute HR  129  -  -   5 Minute HR  133  -  -   6 Minute HR  130  -  -   2 Minute Post HR  123  -  -   Interval Heart Rate?  Yes  -  Yes     Interval Oxygen   Interval Oxygen?  Yes  -  Yes   Baseline Oxygen Saturation %  92 %  -  90 %   1 Minute Oxygen Saturation %  93 %  -  87 %   1 Minute Liters of Oxygen  0 L  -  0 L Room Air   2 Minute Oxygen Saturation %  89 %  -  82 %   2 Minute Liters of Oxygen  0 L  -  0 L   3 Minute Oxygen Saturation %  85 %  -  79 % Test stopped at 2:44   3 Minute Liters of Oxygen  0 L  -  0 L   4 Minute Oxygen Saturation %  82 %  -  -   4 Minute Liters of Oxygen  0 L  -  -   5 Minute Oxygen Saturation %  86 %  -  -   5 Minute Liters of Oxygen  0 L  -  -   6 Minute Oxygen Saturation %  86 %  -  -   6 Minute Liters of Oxygen  0 L  -  -   2 Minute Post Oxygen Saturation %  93 %  -  -     Oxygen Initial  Assessment: Oxygen Initial Assessment - 04/03/18 1115      Home Oxygen   Home Oxygen Device  Portable Concentrator    Sleep Oxygen Prescription  CPAP    Liters per minute  2    Home Exercise Oxygen Prescription  None    Home at Rest Exercise Oxygen Prescription  None    Compliance with Home Oxygen Use  Yes      Initial 6 min Walk   Oxygen Used  None      Program Oxygen Prescription   Program Oxygen Prescription  None      Intervention   Short Term Goals  To learn and exhibit compliance with exercise, home and travel O2 prescription;To learn and understand importance of maintaining oxygen saturations>88%;To learn and demonstrate proper use of respiratory medications;To learn and demonstrate proper pursed lip breathing techniques or other breathing techniques.;To learn and understand importance of monitoring SPO2 with pulse oximeter and demonstrate accurate use of the pulse oximeter.    Long  Term Goals  Exhibits compliance with exercise, home and travel O2 prescription;Verbalizes importance of monitoring SPO2 with pulse oximeter and return demonstration;Maintenance of O2 saturations>88%;Exhibits proper breathing techniques, such as pursed lip breathing or other method taught during program session;Compliance with respiratory medication;Demonstrates proper use of MDI's       Oxygen Re-Evaluation: Oxygen Re-Evaluation    Row Name 04/09/18 1027 06/22/18 1024 07/11/18 1530         Program Oxygen Prescription   Program Oxygen Prescription  None  None  None       Home Oxygen   Home Oxygen Device  Portable Concentrator  Portable Concentrator  Home Concentrator     Sleep Oxygen Prescription  CPAP  CPAP  CPAP     Liters per minute  _0 Home Exercise Oxygen Prescription  None  None  None     Home at Rest Exercise Oxygen Prescription  None  None  None     Compliance with Home Oxygen Use  Yes  Yes  Yes       Goals/Expected Outcomes   Short Term Goals  To learn and exhibit  compliance with exercise, home and travel O2 prescription;To learn and understand importance of maintaining oxygen saturations>88%;To learn and demonstrate proper use of respiratory medications;To learn and demonstrate proper pursed lip breathing techniques or other breathing techniques.;To  learn and understand importance of monitoring SPO2 with pulse oximeter and demonstrate accurate use of the pulse oximeter.  To learn and exhibit compliance with exercise, home and travel O2 prescription;To learn and understand importance of maintaining oxygen saturations>88%;To learn and demonstrate proper use of respiratory medications;To learn and demonstrate proper pursed lip breathing techniques or other breathing techniques.;To learn and understand importance of monitoring SPO2 with pulse oximeter and demonstrate accurate use of the pulse oximeter.  To learn and exhibit compliance with exercise, home and travel O2 prescription;To learn and demonstrate proper use of respiratory medications;To learn and understand importance of monitoring SPO2 with pulse oximeter and demonstrate accurate use of the pulse oximeter.     Long  Term Goals  Exhibits compliance with exercise, home and travel O2 prescription;Verbalizes importance of monitoring SPO2 with pulse oximeter and return demonstration;Maintenance of O2 saturations>88%;Exhibits proper breathing techniques, such as pursed lip breathing or other method taught during program session;Compliance with respiratory medication;Demonstrates proper use of MDI's  Exhibits compliance with exercise, home and travel O2 prescription;Verbalizes importance of monitoring SPO2 with pulse oximeter and return demonstration;Maintenance of O2 saturations>88%;Exhibits proper breathing techniques, such as pursed lip breathing or other method taught during program session;Compliance with respiratory medication;Demonstrates proper use of MDI's  Exhibits compliance with exercise, home and travel O2  prescription;Verbalizes importance of monitoring SPO2 with pulse oximeter and return demonstration;Compliance with respiratory medication;Demonstrates proper use of MDI's     Comments  Reviewed PLB technique with pt.  Talked about how it work and it's important to maintaining his exercise saturations.    Stacie [name] has been compliant with her CPAP and inhaler.  She has not had any problems with either. She is using her PLB and diaphragmactic breathing as well. She finds these to be helpful to reducing her SOB.   Patient has been doing well in [place]. She has been using her PLB in class and when she needs to at home. She is taking her Symbicort as prescribed. She is compliant with her CPAP with a 2 liter bleed in of oxygen. She states that her breathing as got better since the start of the program. She wants to try to lose more weight since that is helping with her breathing. She has an appointment with her pulmonologist soon and is going to inform him of her progress. Her oxygen while walking on the treadmill is the only thing that makes her oxygen drop. She is around 86-90 percent oxygen while on the treadmill.     Goals/Expected Outcomes  Short: Become more profiecient at using PLB.   Long: Become independent at using PLB.  Short: Continue to use PLB.  Long: Continued compliance.   Short: meet with Pulmonologist and inform him of progress. Long: lose more weight to help with breathing.        Oxygen Discharge (Final Oxygen Re-Evaluation): Oxygen Re-Evaluation - 07/11/18 1530      Program Oxygen Prescription   Program Oxygen Prescription  None      Home Oxygen   Home Oxygen Device  Home Concentrator    Sleep Oxygen Prescription  CPAP    Liters per minute  2    Home Exercise Oxygen Prescription  None    Home at Rest Exercise Oxygen Prescription  None    Compliance with Home Oxygen Use  Yes      Goals/Expected Outcomes   Short Term Goals  To learn and exhibit compliance with exercise, home and  travel O2 prescription;To learn and demonstrate proper use  of respiratory medications;To learn and understand importance of monitoring SPO2 with pulse oximeter and demonstrate accurate use of the pulse oximeter.    Long  Term Goals  Exhibits compliance with exercise, home and travel O2 prescription;Verbalizes importance of monitoring SPO2 with pulse oximeter and return demonstration;Compliance with respiratory medication;Demonstrates proper use of MDI's    Comments  Patient has been doing well in [place]. She has been using her PLB in class and when she needs to at home. She is taking her Symbicort as prescribed. She is compliant with her CPAP with a 2 liter bleed in of oxygen. She states that her breathing as got better since the start of the program. She wants to try to lose more weight since that is helping with her breathing. She has an appointment with her pulmonologist soon and is going to inform him of her progress. Her oxygen while walking on the treadmill is the only thing that makes her oxygen drop. She is around 86-90 percent oxygen while on the treadmill.    Goals/Expected Outcomes  Short: meet with Pulmonologist and inform him of progress. Long: lose more weight to help with breathing.       Initial Exercise Prescription: Initial Exercise Prescription - 04/03/18 1300      Date of Initial Exercise RX and Referring Provider   Date  04/03/18    Referring Provider  Rackley      Treadmill   MPH  1.5    Grade  0    Minutes  15    METs  2.15      NuStep   Level  2    SPM  80    Minutes  15    METs  2.1      Biostep-RELP   Level  2    SPM  50    Minutes  15    METs  2      Prescription Details   Frequency (times per week)  3    Duration  Progress to 45 minutes of aerobic exercise without signs/symptoms of physical distress      Intensity   THRR 40-80% of Max Heartrate  135-150    Ratings of Perceived Exertion  11-15    Perceived Dyspnea  0-4      Resistance Training    Training Prescription  Yes    Weight  3 lb    Reps  10-15       Perform Capillary Blood Glucose checks as needed.  Exercise Prescription Changes: Exercise Prescription Changes    Row Name 04/18/18 1400 05/02/18 1200 05/16/18 1500 05/30/18 1200 06/13/18 1200     Response to Exercise   Blood Pressure (Admit)  126/62  110/68  140/60  138/78  120/66   Blood Pressure (Exercise)  134/84  -  -  -  -   Blood Pressure (Exit)  118/60  118/66  128/62  118/70  134/70   Heart Rate (Admit)  107 bpm  106 bpm  110 bpm  106 bpm  102 bpm   Heart Rate (Exercise)  122 bpm  106 bpm  125 bpm  117 bpm  125 bpm   Heart Rate (Exit)  108 bpm  105 bpm  112 bpm  110 bpm  107 bpm   Oxygen Saturation (Admit)  91 %  93 %  89 %  93 %  91 %   Oxygen Saturation (Exercise)  88 %  93 %  82 % up to 88 with  PLB  90 %  89 %   Oxygen Saturation (Exit)  92 %  91 %  88 %  93 %  97 %   Rating of Perceived Exertion (Exercise)  _0 Perceived Dyspnea (Exercise)  _1 Symptoms  SOB  -  -  -  -   Comments  second full day of exercise  -  -  -  -   Duration  Progress to 45 minutes of aerobic exercise without signs/symptoms of physical distress  Continue with 45 min of aerobic exercise without signs/symptoms of physical distress.  Continue with 45 min of aerobic exercise without signs/symptoms of physical distress.  Continue with 45 min of aerobic exercise without signs/symptoms of physical distress.  Continue with 45 min of aerobic exercise without signs/symptoms of physical distress.   Intensity  THRR unchanged  THRR unchanged meeting RPE goals  THRR unchanged  THRR unchanged  THRR unchanged     Progression   Progression  Continue to progress workloads to maintain intensity without signs/symptoms of physical distress.  Continue to progress workloads to maintain intensity without signs/symptoms of physical distress.  Continue to progress workloads to maintain intensity without signs/symptoms of physical  distress.  Continue to progress workloads to maintain intensity without signs/symptoms of physical distress.  Continue to progress workloads to maintain intensity without signs/symptoms of physical distress.   Average METs  1.77  2.25  2.25  2.38  2.6     Resistance Training   Training Prescription  Yes  Yes  Yes  Yes  Yes   Weight  3 lb  2 lb  2 lb  2 lb  2 lb   Reps  10-15  10-15  10-15  10-15  10-15     Interval Training   Interval Training  No  No  No  No  No     Treadmill   MPH  1.3  -  1.5  1.5  1.6   Grade  0  -  0  0  0   Minutes  15  -  _2 METs  2  -  2.15  2.15  2.23     NuStep   Level  _3 -  2   SPM  84  80  80  -  80   Minutes  _4 -  15   METs  2.3  2.5  2.4  -  2.5     Biostep-RELP   Level  _5 SPM  50  50  50  50  50   Minutes  _6 METs  _7 Row Name 06/28/18 0800 07/12/18 1000 07/25/18 1200         Response to Exercise   Blood Pressure (Admit)  128/74  122/70  142/82     Blood Pressure (Exit)  126/62  124/66  130/58     Heart Rate (Admit)  100 bpm  109 bpm  98 bpm     Heart Rate (Exercise)  115 bpm  124 bpm  116 bpm     Heart Rate (Exit)  103 bpm  107 bpm  101  bpm     Oxygen Saturation (Admit)  91 %  90 %  93 %     Oxygen Saturation (Exercise)  94 %  85 %  84 %     Oxygen Saturation (Exit)  92 %  91 %  91 %     Rating of Perceived Exertion (Exercise)  _0 Perceived Dyspnea (Exercise)  _1 Duration  Continue with 45 min of aerobic exercise without signs/symptoms of physical distress.  Continue with 45 min of aerobic exercise without signs/symptoms of physical distress.  Continue with 45 min of aerobic exercise without signs/symptoms of physical distress.     Intensity  THRR unchanged  THRR unchanged  THRR unchanged       Progression   Progression  Continue to progress workloads to maintain intensity without signs/symptoms of physical distress.  Continue to progress  workloads to maintain intensity without signs/symptoms of physical distress.  Continue to progress workloads to maintain intensity without signs/symptoms of physical distress.     Average METs  2.6  2.6  2.3       Resistance Training   Training Prescription  Yes  Yes  Yes     Weight  3 lb  3 lb  3 lb     Reps  10-15  10-15  10-15       Interval Training   Interval Training  No  No  No       Treadmill   MPH  1.5  1.5  1.5     Grade  0  0  0     Minutes  _2 METs  2.5  2.5  2.15       NuStep   Level  _3 SPM  80  80  80     Minutes  _4 METs  2.2  2.2  2.5        Exercise Comments: Exercise Comments    Row Name 04/09/18 1027           Exercise Comments  First full day of exercise!  Patient was oriented to gym and equipment including functions, settings, policies, and procedures.  Patient's individual exercise prescription and treatment plan were reviewed.  All starting workloads were established based on the results of the 6 minute walk test done at initial orientation visit.  The plan for exercise progression was also introduced and progression will be customized based on patient's performance and goals.          Exercise Goals and Review: Exercise Goals    Row Name 04/03/18 1300             Exercise Goals   Increase Physical Activity  Yes       Intervention  Provide advice, education, support and counseling about physical activity/exercise needs.;Develop an individualized exercise prescription for aerobic and resistive training based on initial evaluation findings, risk stratification, comorbidities and participant's personal goals.       Expected Outcomes  Short Term: Attend rehab on a regular basis to increase amount of physical activity.;Long Term: Add in home exercise to make exercise part of routine and to increase amount of physical activity.;Long Term: Exercising regularly at least 3-5 days a week.       Increase Strength and  Stamina  Yes       Intervention  Provide advice, education, support and counseling about physical activity/exercise needs.;Develop an individualized exercise prescription for aerobic and resistive training based on initial evaluation findings, risk stratification, comorbidities and participant's personal goals.       Expected Outcomes  Short Term: Increase workloads from initial exercise prescription for resistance, speed, and METs.;Short Term: Perform resistance training exercises routinely during rehab and add in resistance training at home;Long Term: Improve cardiorespiratory fitness, muscular endurance and strength as measured by increased METs and functional capacity (6MWT)       Able to understand and use rate of perceived exertion (RPE) scale  Yes       Intervention  Provide education and explanation on how to use RPE scale       Expected Outcomes  Short Term: Able to use RPE daily in rehab to express subjective intensity level;Long Term:  Able to use RPE to guide intensity level when exercising independently       Able to understand and use Dyspnea scale  Yes       Intervention  Provide education and explanation on how to use Dyspnea scale       Expected Outcomes  Short Term: Able to use Dyspnea scale daily in rehab to express subjective sense of shortness of breath during exertion;Long Term: Able to use Dyspnea scale to guide intensity level when exercising independently       Knowledge and understanding of Target Heart Rate Range (THRR)  Yes       Intervention  Provide education and explanation of THRR including how the numbers were predicted and where they are located for reference       Expected Outcomes  Short Term: Able to state/look up THRR;Short Term: Able to use daily as guideline for intensity in rehab;Long Term: Able to use THRR to govern intensity when exercising independently       Able to check pulse independently  Yes       Intervention  Provide education and demonstration on how  to check pulse in carotid and radial arteries.;Review the importance of being able to check your own pulse for safety during independent exercise       Expected Outcomes  Short Term: Able to explain why pulse checking is important during independent exercise;Long Term: Able to check pulse independently and accurately       Understanding of Exercise Prescription  Yes       Intervention  Provide education, explanation, and written materials on patient's individual exercise prescription       Expected Outcomes  Short Term: Able to explain program exercise prescription;Long Term: Able to explain home exercise prescription to exercise independently          Exercise Goals Re-Evaluation : Exercise Goals Re-Evaluation    Row Name 04/09/18 1027 04/18/18 1450 05/02/18 1232 05/16/18 1508 06/13/18 1252     Exercise Goal Re-Evaluation   Exercise Goals Review  Understanding of Exercise Prescription;Able to understand and use Dyspnea scale;Knowledge and understanding of Target Heart Rate Range (THRR);Able to understand and use rate of perceived exertion (RPE) scale  Increase Physical Activity;Increase Strength and Stamina;Understanding of Exercise Prescription  Increase Physical Activity;Able to understand and use rate of perceived exertion (RPE) scale;Increase Strength and Stamina;Knowledge and understanding of Target Heart Rate Range (THRR)  Increase Physical Activity;Able to understand and use rate of perceived exertion (RPE) scale;Increase Strength and Stamina;Able to understand and use Dyspnea scale  Increase Physical Activity;Increase Strength and Stamina;Able  to understand and use rate of perceived exertion (RPE) scale;Able to understand and use Dyspnea scale;Knowledge and understanding of Target Heart Rate Range (THRR);Understanding of Exercise Prescription   Comments  Reviewed RPE scale, THR and program prescription with pt today.  Pt voiced understanding and was given a copy of goals to take home.   Stacie [name]  has completed two full days of exercise.  She did have to decrease her treadmill speed to 1.3 mph.  We will continue to monitor her progression.   Stacie [name] is making small progressions with exercise.  Staff will monitor progress.  Stacie [name] had added speed to TM and increased overall MET level.  Staff will monitor progress.  Stacie [name] is progressing well and has increased overall MET level.     Expected Outcomes  Short: Use RPE daily to regulate intensity.  Long: Follow program prescription in THR.  Short: Continue to attend regularly. Long: Continue to follow prescription.   Short - increase level on Biostep Long - increase overall MET level   Short - increase weight strength training  Long - increase MET level   Short - continue to attend LW consistently Long - Increase overall MET level and maintain on her own   [place] Name 06/22/18 1017 06/28/18 0827 07/12/18 1011 07/25/18 1218       Exercise Goal Re-Evaluation   Exercise Goals Review  Increase Physical Activity;Increase Strength and Stamina;Understanding of Exercise Prescription  Increase Physical Activity;Increase Strength and Stamina;Able to understand and use rate of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  [facility]  Instruction Review Code  1- Verbalizes Understanding      Sleep Hygiene: -Provides group verbal and written instruction about how sleep can affect your health.  Define sleep  hygiene, discuss sleep cycles and impact of sleep habits. Review good sleep hygiene tips.    Pulmonary Rehab from 08/01/2018 in [facility] Cardiac and Pulmonary Rehab  Date  04/11/18  Educator  [facility]  Instruction Review Code  1- Verbalizes Understanding      Other: -Provides group and verbal instruction on various topics (see comments)    Knowledge Questionnaire Score: Knowledge Questionnaire Score - 07/13/18 1152      Knowledge Questionnaire Score   Pre Score  16/18    Post Score  15/18        Core Components/Risk Factors/Patient Goals at Admission: Personal Goals and Risk Factors at Admission - 05/30/18 1037      Core Components/Risk Factors/Patient Goals on Admission    Weight Management  Yes       Core Components/Risk Factors/Patient Goals Review:  Goals and Risk Factor Review    Row Name 04/27/18 1048 05/30/18 1038 06/22/18 1019 07/23/18 1656       Core Components/Risk Factors/Patient Goals Review   Personal Goals Review  Weight Management/Obesity;Improve shortness of breath with ADL's;Hypertension  Weight  Management/Obesity;Improve shortness of breath with ADL's;Other;Hypertension;Develop more efficient breathing techniques such as purse lipped breathing and diaphragmatic breathing and practicing self-pacing with activity.  Weight Management/Obesity;Improve shortness of breath with ADL's;Other;Hypertension  Weight Management/Obesity;Improve shortness of breath with ADL's;Other;Hypertension    Review  Stacie [name]'s weight is still fluctuating related to Prednisone. She is coming off of another round and hopes to be able to get back on track. Her blood pressure has been stable. She has noticed an improvement with her SOB already since starting [name]. She reported being able to walk up her stairs at home with little difficulty  Stacie [name] has continued to lose weight and is down 20 lb total - around 8-10 lb since starting [facility].  She has noticed she can walk up stairs without "feeling like I m  going to vomit".  She can also walk all the way down to the gym without stopping to rest.  She is taking all meds as directed.  She has noticed more overall strength.  She states her breathing is better but not as much an improvement as strength.  She uses PLB daily.    Stacie [name] continues to well with weight loss and was able to maintain her weight while on vacation.  Her blood pressures have been good in class and she checks it at the grocery store and on occasion at home.  She is doing well with her mediations.  She has noted an improvement in her SOB since starting the program, but not back to where she wants to be yet.   Patient has been doing well with her weight loss and continues to improve with her breathing. Her blood pressure has been good and she states her Sarcoidosis is in remission according to her doctor. Her ADL's have been getting better but she still has issues with walking for a long period of time.    Expected Outcomes  Short: Stacie [name] will continue to work on her SOB by attending exercise and work on her goal weight. Long: reach goal weight of 150lb through exercise and healthy eating.   Short - continue to exercise and follow nutrition guidlines to lose weight Long - be able to do all ADLs without SOB or undue fatigue  Short: Continue to work on weight loss.  Long: Continue to improve SOB at home.   Short: Attend [name] regularly to improve shortness of breath with ADL's. Long: maintain independence with ADL's        Core Components/Risk Factors/Patient Goals at Discharge (Final Review):  Goals and Risk Factor Review - 07/23/18 1656      Core Components/Risk Factors/Patient Goals Review   Personal Goals Review  Weight Management/Obesity;Improve shortness of breath with ADL's;Other;Hypertension    Review  Patient has been doing well with her weight loss and continues to improve with her breathing. Her blood pressure has been good and she states her Sarcoidosis is in remission according to  her doctor. Her ADL's have been getting better but she still has issues with walking for a long period of time.    Expected Outcomes  Short: Attend [name] regularly to improve shortness of breath with ADL's. Long: maintain independence with ADL's        ITP Comments: ITP Comments    Row Name 04/03/18 1038 04/16/18 0945 05/14/18 0844 06/11/18 0821 07/09/18 0825   ITP Comments  Medical Evaluation completed. Chart sent for review and changes to Dr. Caryl Comes for Dr. Emily Filbert Director of [place]. Diagnosis can be found in [facility] encounter 03/22/18  30 day review completed. ITP sent to Dr. Emily Filbert Director of [place]. Continue with ITP unless changes are made by physician  30 day review completed. ITP sent to Dr. Emily Filbert Director of [place]. Continue with ITP unless changes are made by physician  30 day review completed. ITP sent to Dr. Emily Filbert Director of [place]. Continue with ITP unless changes are made by physician  30 day review completed. ITP sent to Dr. Emily Filbert Director of [place]. Continue with ITP unless changes are made by physician   [place] Name 08/06/18 0830           ITP Comments  30 day review completed. ITP sent to Dr. Emily Filbert Director of [place]. Continue with ITP unless changes are made by physician.          Comments: 30 day review

## 2018-08-08 DIAGNOSIS — R0602 Shortness of breath: Secondary | ICD-10-CM | POA: Diagnosis not present

## 2018-08-08 DIAGNOSIS — D860 Sarcoidosis of lung: Secondary | ICD-10-CM

## 2018-08-08 NOTE — Progress Notes (Signed)
Daily Session Note  Patient Details  Name: Stacie [name] MRN: [id number] Date of Birth: [birth year] Referring Provider:     Pulmonary Rehab from 04/03/2018 in [facility] Cardiac and Pulmonary Rehab  Referring Provider  [place]      Encounter Date: 08/08/2018  Check In: Session Check In - 08/08/18 1015      Check-In   Supervising physician immediately available to respond to emergencies  [name] immediately available ER MD    Physician(s)  Joni Fears and Darl Householder    Location  [facility]-Cardiac & Pulmonary Rehab    Staff Present  Alberteen Sam, MA, RCEP, CCRP, Exercise Physiologist;Joseph Foy [place], [place], ACSM CEP, Exercise Physiologist    Medication changes reported      No    Fall or balance concerns reported     No    Warm-up and Cool-down  Performed as group-led instruction    Resistance Training Performed  Yes    VAD Patient?  No    PAD/SET Patient?  No      Pain Assessment   Currently in Pain?  No/denies    Multiple Pain Sites  No          Social History   Tobacco Use  Smoking Status Never Smoker  Smokeless Tobacco Never Used    Goals Met:  Proper associated with RPD/PD & O2 Sat Independence with exercise equipment Exercise tolerated well Strength training completed today  Goals Unmet:  Not Applicable  Comments: Pt able to follow exercise prescription today without complaint.  Will continue to monitor for progression.    Dr. Emily Filbert is Medical Director for [place] and [name] Pulmonary Rehabilitation.

## 2018-08-10 DIAGNOSIS — D860 Sarcoidosis of lung: Secondary | ICD-10-CM | POA: Diagnosis not present

## 2018-08-10 DIAGNOSIS — R0602 Shortness of breath: Secondary | ICD-10-CM | POA: Diagnosis not present

## 2018-08-10 NOTE — Progress Notes (Signed)
Pulmonary Individual Treatment Plan  Patient Details  Name: Stacie [name] MRN: [id number] Date of Birth: June 20, 1956 Referring Provider:     Pulmonary Rehab from 04/03/2018 in [facility] Cardiac and Pulmonary Rehab  Referring Provider  Rackley      Initial Encounter Date:    Pulmonary Rehab from 04/03/2018 in [facility] Cardiac and Pulmonary Rehab  Date  04/03/18      Visit Diagnosis: Pulmonary sarcoidosis ([place])  Patient's Home Medications on Admission:  Current Outpatient Medications:  .  Acetaminophen-Codeine (TYLENOL/CODEINE #3) 300-30 MG tablet, Take 1 tablet by mouth every 4 (four) hours as needed for pain., Disp: 42 tablet, Rfl: 0 .  albuterol (VENTOLIN HFA) 108 (90 Base) MCG/ACT inhaler, Inhale 2 puffs into the lungs every 6 (six) hours as needed for wheezing., Disp: 1 Inhaler, Rfl: 2 .  amLODipine (NORVASC) 5 MG tablet, Take 1 tablet (5 mg total) by mouth daily., Disp: 90 tablet, Rfl: 1 .  budesonide-formoterol (SYMBICORT) 80-4.5 MCG/ACT inhaler, Take 2 puffs first thing in am and then another 2 puffs about 12 hours later., Disp: 1 Inhaler, Rfl: 11 .  cetirizine (ZYRTEC) 10 MG tablet, Take 10 mg by mouth daily as needed. , Disp: , Rfl:  .  escitalopram (LEXAPRO) 10 MG tablet, TAKE 1 TABLET BY MOUTH EVERY DAY, Disp: 90 tablet, Rfl: 1 .  FORTEO 600 MCG/2.4ML SOLN, , Disp: , Rfl:  .  GLUCOSAMINE PO, Take 1 tablet by mouth daily., Disp: , Rfl:  .  HYDROcodone-acetaminophen (NORCO/VICODIN) 5-325 MG tablet, Take 1 tablet by mouth every 4 (four) hours as needed., Disp: , Rfl: 0 .  metroNIDAZOLE (FLAGYL) 500 MG tablet, Take 1 tablet (500 mg total) by mouth 3 (three) times daily., Disp: 21 tablet, Rfl: 0 .  mometasone (NASONEX) 50 MCG/ACT nasal spray, Place 1 spray into the nose 2 (two) times daily., Disp: , Rfl:  .  Multiple Vitamin (MULTIVITAMIN) capsule, Take 1 capsule by mouth 2 (two) times daily. , Disp: , Rfl:  .  NON FORMULARY, CPAP nightly, Disp: , Rfl:  .  predniSONE (DELTASONE) 10 MG  tablet, Take 7.5 mg by mouth. , Disp: , Rfl:  .  Respiratory Therapy Supplies (FLUTTER) DEVI, Use as directed, Disp: 1 each, Rfl: 0 .  SYNTHROID 137 MCG tablet, TAKE 1 TABLET (137 MCG TOTAL) BY MOUTH DAILY BEFORE BREAKFAST., Disp: 90 tablet, Rfl: 1  Past Medical History: Past Medical History:  Diagnosis Date  . Arrhythmia    patient unaware if this is current  . Asthma   . HOH (hard of hearing)    wear aids  . Hypothyroidism   . IBS (irritable bowel syndrome)   . Sarcoid   . Sarcoidosis   . Seasonal allergies   . Sleep apnea CPAP with O2    Tobacco Use: Social History   Tobacco Use  Smoking Status Never Smoker  Smokeless Tobacco Never Used    Labs: Recent Review Scientist, physiological    Labs for ITP Cardiac and Pulmonary Rehab Latest Ref Rng & Units 04/20/2017   Cholestrol 100 - 199 mg/dL 230(H)   LDLCALC 0 - 99 mg/dL 127(H)   HDL >39 mg/dL 54   Trlycerides 0 - 149 mg/dL 246(H)   Hemoglobin A1c 4.8 - 5.6 % 6.0(H)       Pulmonary Assessment Scores: Pulmonary Assessment Scores    Row Name 04/03/18 1108 07/13/18 1151 08/10/18 1011     ADL UCSD   ADL Phase  Entry  Exit  Exit   SOB  Score total  80  53  -   Rest  2  1  -   Walk  3  3  -   Stairs  5  4  -   Bath  4  3  -   Dress  3  3  -   Shop  4  3  -     CAT Score   CAT Score  22  14  -     mMRC Score   mMRC Score  3  -  3      Pulmonary Function Assessment: Pulmonary Function Assessment - 04/03/18 1112      Breath   Bilateral Breath Sounds  Wheezes;Decreased   slight wheezes in LLL   Shortness of Breath  Yes;Limiting activity       Exercise Target Goals: Exercise Program Goal: Individual exercise prescription set using results from initial 6 min walk test and THRR while considering  patient's activity barriers and safety.   Exercise Prescription Goal: Initial exercise prescription builds to 30-45 minutes a day of aerobic activity, 2-3 days per week.  Home exercise guidelines will be given to patient  during program as part of exercise prescription that the participant will acknowledge.  Activity Barriers & Risk Stratification:   6 Minute Walk: 6 Minute Walk    Row Name 04/03/18 1301 07/20/18 1103 08/01/18 1047     6 Minute Walk   Phase  -  Discharge  Discharge   Distance  786 feet  600 feet  425 feet   Walk Time  4.9 minutes  3 minutes  2.75 minutes   # of Rest Breaks  1  0  0   MPH  1.82  -  -   METS  2.96  2.25  -   RPE  _0 Perceived Dyspnea   _1 VO2 Peak  10.4  7.9  -   Symptoms  No  Yes (comment)  Yes (comment)   Comments  -  o2 dropped to 78 - bad breathing day due to weather  Saturation dropped to 79% again.  We will use 600 ft as post test results   Resting HR  121 bpm  130 bpm  97 bpm   Resting BP  128/66  124/64  130/66   Resting Oxygen Saturation   92 %  91 %  90 %   Exercise Oxygen Saturation  during 6 min walk  82 %  78 %  79 %   Max Ex. HR  133 bpm  131 bpm  111 bpm   Max Ex. BP  176/72  -  152/84   2 Minute Post BP  140/76  -  -     Interval HR   1 Minute HR  124  -  105   2 Minute HR  124  -  110   3 Minute HR  126  -  111   4 Minute HR  129  -  -   5 Minute HR  133  -  -   6 Minute HR  130  -  -   2 Minute Post HR  123  -  -   Interval Heart Rate?  Yes  -  Yes     Interval Oxygen   Interval Oxygen?  Yes  -  Yes   Baseline Oxygen Saturation %  92 %  -  90 %  1 Minute Oxygen Saturation %  93 %  -  87 %   1 Minute Liters of Oxygen  0 L  -  0 L Room Air   2 Minute Oxygen Saturation %  89 %  -  82 %   2 Minute Liters of Oxygen  0 L  -  0 L   3 Minute Oxygen Saturation %  85 %  -  79 % Test stopped at 2:44   3 Minute Liters of Oxygen  0 L  -  0 L   4 Minute Oxygen Saturation %  82 %  -  -   4 Minute Liters of Oxygen  0 L  -  -   5 Minute Oxygen Saturation %  86 %  -  -   5 Minute Liters of Oxygen  0 L  -  -   6 Minute Oxygen Saturation %  86 %  -  -   6 Minute Liters of Oxygen  0 L  -  -   2 Minute Post Oxygen Saturation %  93 %  -   -     Oxygen Initial Assessment: Oxygen Initial Assessment - 04/03/18 1115      Home Oxygen   Home Oxygen Device  Portable Concentrator    Sleep Oxygen Prescription  CPAP    Liters per minute  2    Home Exercise Oxygen Prescription  None    Home at Rest Exercise Oxygen Prescription  None    Compliance with Home Oxygen Use  Yes      Initial 6 min Walk   Oxygen Used  None      Program Oxygen Prescription   Program Oxygen Prescription  None      Intervention   Short Term Goals  To learn and exhibit compliance with exercise, home and travel O2 prescription;To learn and understand importance of maintaining oxygen saturations>88%;To learn and demonstrate proper use of respiratory medications;To learn and demonstrate proper pursed lip breathing techniques or other breathing techniques.;To learn and understand importance of monitoring SPO2 with pulse oximeter and demonstrate accurate use of the pulse oximeter.    Long  Term Goals  Exhibits compliance with exercise, home and travel O2 prescription;Verbalizes importance of monitoring SPO2 with pulse oximeter and return demonstration;Maintenance of O2 saturations>88%;Exhibits proper breathing techniques, such as pursed lip breathing or other method taught during program session;Compliance with respiratory medication;Demonstrates proper use of MDI's       Oxygen Re-Evaluation: Oxygen Re-Evaluation    Row Name 04/09/18 1027 06/22/18 1024 07/11/18 1530         Program Oxygen Prescription   Program Oxygen Prescription  None  None  None       Home Oxygen   Home Oxygen Device  Portable Concentrator  Portable Concentrator  Home Concentrator     Sleep Oxygen Prescription  CPAP  CPAP  CPAP     Liters per minute  _0 Home Exercise Oxygen Prescription  None  None  None     Home at Rest Exercise Oxygen Prescription  None  None  None     Compliance with Home Oxygen Use  Yes  Yes  Yes       Goals/Expected Outcomes   Short Term Goals  To  learn and exhibit compliance with exercise, home and travel O2 prescription;To learn and understand importance of maintaining oxygen saturations>88%;To learn and demonstrate proper use of respiratory medications;To learn  and demonstrate proper pursed lip breathing techniques or other breathing techniques.;To learn and understand importance of monitoring SPO2 with pulse oximeter and demonstrate accurate use of the pulse oximeter.  To learn and exhibit compliance with exercise, home and travel O2 prescription;To learn and understand importance of maintaining oxygen saturations>88%;To learn and demonstrate proper use of respiratory medications;To learn and demonstrate proper pursed lip breathing techniques or other breathing techniques.;To learn and understand importance of monitoring SPO2 with pulse oximeter and demonstrate accurate use of the pulse oximeter.  To learn and exhibit compliance with exercise, home and travel O2 prescription;To learn and demonstrate proper use of respiratory medications;To learn and understand importance of monitoring SPO2 with pulse oximeter and demonstrate accurate use of the pulse oximeter.     Long  Term Goals  Exhibits compliance with exercise, home and travel O2 prescription;Verbalizes importance of monitoring SPO2 with pulse oximeter and return demonstration;Maintenance of O2 saturations>88%;Exhibits proper breathing techniques, such as pursed lip breathing or other method taught during program session;Compliance with respiratory medication;Demonstrates proper use of MDI's  Exhibits compliance with exercise, home and travel O2 prescription;Verbalizes importance of monitoring SPO2 with pulse oximeter and return demonstration;Maintenance of O2 saturations>88%;Exhibits proper breathing techniques, such as pursed lip breathing or other method taught during program session;Compliance with respiratory medication;Demonstrates proper use of MDI's  Exhibits compliance with exercise,  home and travel O2 prescription;Verbalizes importance of monitoring SPO2 with pulse oximeter and return demonstration;Compliance with respiratory medication;Demonstrates proper use of MDI's     Comments  Reviewed PLB technique with pt.  Talked about how it work and it's important to maintaining his exercise saturations.    Stacie [name] has been compliant with her CPAP and inhaler.  She has not had any problems with either. She is using her PLB and diaphragmactic breathing as well. She finds these to be helpful to reducing her SOB.   Patient has been doing well in [place]. She has been using her PLB in class and when she needs to at home. She is taking her Symbicort as prescribed. She is compliant with her CPAP with a 2 liter bleed in of oxygen. She states that her breathing as got better since the start of the program. She wants to try to lose more weight since that is helping with her breathing. She has an appointment with her pulmonologist soon and is going to inform him of her progress. Her oxygen while walking on the treadmill is the only thing that makes her oxygen drop. She is around 86-90 percent oxygen while on the treadmill.     Goals/Expected Outcomes  Short: Become more profiecient at using PLB.   Long: Become independent at using PLB.  Short: Continue to use PLB.  Long: Continued compliance.   Short: meet with Pulmonologist and inform him of progress. Long: lose more weight to help with breathing.        Oxygen Discharge (Final Oxygen Re-Evaluation): Oxygen Re-Evaluation - 07/11/18 1530      Program Oxygen Prescription   Program Oxygen Prescription  None      Home Oxygen   Home Oxygen Device  Home Concentrator    Sleep Oxygen Prescription  CPAP    Liters per minute  2    Home Exercise Oxygen Prescription  None    Home at Rest Exercise Oxygen Prescription  None    Compliance with Home Oxygen Use  Yes      Goals/Expected Outcomes   Short Term Goals  To learn and exhibit compliance with  exercise, home and travel O2 prescription;To learn and demonstrate proper use of respiratory medications;To learn and understand importance of monitoring SPO2 with pulse oximeter and demonstrate accurate use of the pulse oximeter.    Long  Term Goals  Exhibits compliance with exercise, home and travel O2 prescription;Verbalizes importance of monitoring SPO2 with pulse oximeter and return demonstration;Compliance with respiratory medication;Demonstrates proper use of MDI's    Comments  Patient has been doing well in [place]. She has been using her PLB in class and when she needs to at home. She is taking her Symbicort as prescribed. She is compliant with her CPAP with a 2 liter bleed in of oxygen. She states that her breathing as got better since the start of the program. She wants to try to lose more weight since that is helping with her breathing. She has an appointment with her pulmonologist soon and is going to inform him of her progress. Her oxygen while walking on the treadmill is the only thing that makes her oxygen drop. She is around 86-90 percent oxygen while on the treadmill.    Goals/Expected Outcomes  Short: meet with Pulmonologist and inform him of progress. Long: lose more weight to help with breathing.       Initial Exercise Prescription: Initial Exercise Prescription - 04/03/18 1300      Date of Initial Exercise RX and Referring Provider   Date  04/03/18    Referring Provider  Rackley      Treadmill   MPH  1.5    Grade  0    Minutes  15    METs  2.15      NuStep   Level  2    SPM  80    Minutes  15    METs  2.1      Biostep-RELP   Level  2    SPM  50    Minutes  15    METs  2      Prescription Details   Frequency (times per week)  3    Duration  Progress to 45 minutes of aerobic exercise without signs/symptoms of physical distress      Intensity   THRR 40-80% of Max Heartrate  135-150    Ratings of Perceived Exertion  11-15    Perceived Dyspnea  0-4       Resistance Training   Training Prescription  Yes    Weight  3 lb    Reps  10-15       Perform Capillary Blood Glucose checks as needed.  Exercise Prescription Changes:  Exercise Prescription Changes    Row Name 04/18/18 1400 05/02/18 1200 05/16/18 1500 05/30/18 1200 06/13/18 1200     Response to Exercise   Blood Pressure (Admit)  126/62  110/68  140/60  138/78  120/66   Blood Pressure (Exercise)  134/84  -  -  -  -   Blood Pressure (Exit)  118/60  118/66  128/62  118/70  134/70   Heart Rate (Admit)  107 bpm  106 bpm  110 bpm  106 bpm  102 bpm   Heart Rate (Exercise)  122 bpm  106 bpm  125 bpm  117 bpm  125 bpm   Heart Rate (Exit)  108 bpm  105 bpm  112 bpm  110 bpm  107 bpm   Oxygen Saturation (Admit)  91 %  93 %  89 %  93 %  91 %   Oxygen Saturation (Exercise)  88 %  93 %  82 % up to 88 with PLB  90 %  89 %   Oxygen Saturation (Exit)  92 %  91 %  88 %  93 %  97 %   Rating of Perceived Exertion (Exercise)  _0 Perceived Dyspnea (Exercise)  _1 Symptoms  SOB  -  -  -  -   Comments  second full day of exercise  -  -  -  -   Duration  Progress to 45 minutes of aerobic exercise without signs/symptoms of physical distress  Continue with 45 min of aerobic exercise without signs/symptoms of physical distress.  Continue with 45 min of aerobic exercise without signs/symptoms of physical distress.  Continue with 45 min of aerobic exercise without signs/symptoms of physical distress.  Continue with 45 min of aerobic exercise without signs/symptoms of physical distress.   Intensity  THRR unchanged  THRR unchanged meeting RPE goals  THRR unchanged  THRR unchanged  THRR unchanged     Progression   Progression  Continue to progress workloads to maintain intensity without signs/symptoms of physical distress.  Continue to progress workloads to maintain intensity without signs/symptoms of physical distress.  Continue to progress workloads to maintain intensity without  signs/symptoms of physical distress.  Continue to progress workloads to maintain intensity without signs/symptoms of physical distress.  Continue to progress workloads to maintain intensity without signs/symptoms of physical distress.   Average METs  1.77  2.25  2.25  2.38  2.6     Resistance Training   Training Prescription  Yes  Yes  Yes  Yes  Yes   Weight  3 lb  2 lb  2 lb  2 lb  2 lb   Reps  10-15  10-15  10-15  10-15  10-15     Interval Training   Interval Training  No  No  No  No  No     Treadmill   MPH  1.3  -  1.5  1.5  1.6   Grade  0  -  0  0  0   Minutes  15  -  _2 METs  2  -  2.15  2.15  2.23     NuStep   Level  _3 -  2   SPM  84  80  80  -  80   Minutes  _4 -  15   METs  2.3  2.5  2.4  -  2.5     Biostep-RELP   Level  _5 SPM  50  50  50  50  50   Minutes  _6 METs  _7 Row Name 06/28/18 0800 07/12/18 1000 07/25/18 1200         Response to Exercise   Blood Pressure (Admit)  128/74  122/70  142/82     Blood Pressure (Exit)  126/62  124/66  130/58     Heart Rate (Admit)  100 bpm  109 bpm  98 bpm     Heart Rate (Exercise)  115 bpm  124 bpm  116 bpm  Heart Rate (Exit)  103 bpm  107 bpm  101 bpm     Oxygen Saturation (Admit)  91 %  90 %  93 %     Oxygen Saturation (Exercise)  94 %  85 %  84 %     Oxygen Saturation (Exit)  92 %  91 %  91 %     Rating of Perceived Exertion (Exercise)  _0 Perceived Dyspnea (Exercise)  _1 Duration  Continue with 45 min of aerobic exercise without signs/symptoms of physical distress.  Continue with 45 min of aerobic exercise without signs/symptoms of physical distress.  Continue with 45 min of aerobic exercise without signs/symptoms of physical distress.     Intensity  THRR unchanged  THRR unchanged  THRR unchanged       Progression   Progression  Continue to progress workloads to maintain intensity without signs/symptoms of physical distress.   Continue to progress workloads to maintain intensity without signs/symptoms of physical distress.  Continue to progress workloads to maintain intensity without signs/symptoms of physical distress.     Average METs  2.6  2.6  2.3       Resistance Training   Training Prescription  Yes  Yes  Yes     Weight  3 lb  3 lb  3 lb     Reps  10-15  10-15  10-15       Interval Training   Interval Training  No  No  No       Treadmill   MPH  1.5  1.5  1.5     Grade  0  0  0     Minutes  _2 METs  2.5  2.5  2.15       NuStep   Level  _3 SPM  80  80  80     Minutes  _4 METs  2.2  2.2  2.5        Exercise Comments:  Exercise Comments    Row Name 04/09/18 1027 08/10/18 1007         Exercise Comments  First full day of exercise!  Patient was oriented to gym and equipment including functions, settings, policies, and procedures.  Patient's individual exercise prescription and treatment plan were reviewed.  All starting workloads were established based on the results of the 6 minute walk test done at initial orientation visit.  The plan for exercise progression was also introduced and progression will be customized based on patient's performance and goals.  Anina graduated today from  rehab with 36 sessions completed.  Details of the patient's exercise prescription and what She needs to do in order to continue the prescription and progress were discussed with patient.  Patient was given a copy of prescription and goals.  Patient verbalized understanding.  Stacie [name] plans to continue to exercise by [employer].         Exercise Goals and Review:  Exercise Goals    Row Name 04/03/18 1300             Exercise Goals   Increase Physical Activity  Yes       Intervention  Provide advice, education, support and counseling about physical activity/exercise needs.;Develop an individualized exercise prescription for aerobic and resistive training based  on initial  evaluation findings, risk stratification, comorbidities and participant's personal goals.       Expected Outcomes  Short Term: Attend rehab on a regular basis to increase amount of physical activity.;Long Term: Add in home exercise to make exercise part of routine and to increase amount of physical activity.;Long Term: Exercising regularly at least 3-5 days a week.       Increase Strength and Stamina  Yes       Intervention  Provide advice, education, support and counseling about physical activity/exercise needs.;Develop an individualized exercise prescription for aerobic and resistive training based on initial evaluation findings, risk stratification, comorbidities and participant's personal goals.       Expected Outcomes  Short Term: Increase workloads from initial exercise prescription for resistance, speed, and METs.;Short Term: Perform resistance training exercises routinely during rehab and add in resistance training at home;Long Term: Improve cardiorespiratory fitness, muscular endurance and strength as measured by increased METs and functional capacity (6MWT)       Able to understand and use rate of perceived exertion (RPE) scale  Yes       Intervention  Provide education and explanation on how to use RPE scale       Expected Outcomes  Short Term: Able to use RPE daily in rehab to express subjective intensity level;Long Term:  Able to use RPE to guide intensity level when exercising independently       Able to understand and use Dyspnea scale  Yes       Intervention  Provide education and explanation on how to use Dyspnea scale       Expected Outcomes  Short Term: Able to use Dyspnea scale daily in rehab to express subjective sense of shortness of breath during exertion;Long Term: Able to use Dyspnea scale to guide intensity level when exercising independently       Knowledge and understanding of Target Heart Rate Range (THRR)  Yes       Intervention  Provide education and explanation of THRR  including how the numbers were predicted and where they are located for reference       Expected Outcomes  Short Term: Able to state/look up THRR;Short Term: Able to use daily as guideline for intensity in rehab;Long Term: Able to use THRR to govern intensity when exercising independently       Able to check pulse independently  Yes       Intervention  Provide education and demonstration on how to check pulse in carotid and radial arteries.;Review the importance of being able to check your own pulse for safety during independent exercise       Expected Outcomes  Short Term: Able to explain why pulse checking is important during independent exercise;Long Term: Able to check pulse independently and accurately       Understanding of Exercise Prescription  Yes       Intervention  Provide education, explanation, and written materials on patient's individual exercise prescription       Expected Outcomes  Short Term: Able to explain program exercise prescription;Long Term: Able to explain home exercise prescription to exercise independently          Exercise Goals Re-Evaluation : Exercise Goals Re-Evaluation    Row Name 04/09/18 1027 04/18/18 1450 05/02/18 1232 05/16/18 1508 06/13/18 1252     Exercise Goal Re-Evaluation   Exercise Goals Review  Understanding of Exercise Prescription;Able to understand and use Dyspnea scale;Knowledge and understanding of Target Heart Rate Range (THRR);Able to understand  and use rate of perceived exertion (RPE) scale  Increase Physical Activity;Increase Strength and Stamina;Understanding of Exercise Prescription  Increase Physical Activity;Able to understand and use rate of perceived exertion (RPE) scale;Increase Strength and Stamina;Knowledge and understanding of Target Heart Rate Range (THRR)  Increase Physical Activity;Able to understand and use rate of perceived exertion (RPE) scale;Increase Strength and Stamina;Able to understand and use Dyspnea scale  Increase Physical  Activity;Increase Strength and Stamina;Able to understand and use rate of perceived exertion (RPE) scale;Able to understand and use Dyspnea scale;Knowledge and understanding of Target Heart Rate Range (THRR);Understanding of Exercise Prescription   Comments  Reviewed RPE scale, THR and program prescription with pt today.  Pt voiced understanding and was given a copy of goals to take home.   Stacie [name] has completed two full days of exercise.  She did have to decrease her treadmill speed to 1.3 mph.  We will continue to monitor her progression.   Stacie [name] is making small progressions with exercise.  Staff will monitor progress.  Stacie [name] had added speed to TM and increased overall MET level.  Staff will monitor progress.  Stacie [name] is progressing well and has increased overall MET level.     Expected Outcomes  Short: Use RPE daily to regulate intensity.  Long: Follow program prescription in THR.  Short: Continue to attend regularly. Long: Continue to follow prescription.   Short - increase level on Biostep Long - increase overall MET level   Short - increase weight strength training  Long - increase MET level   Short -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  Date  05/16/18  Educator  [facility]  Instruction Review Code  5- Refused Teaching      Cardiac Medications: - Group  verbal and written instruction to review commonly prescribed medications for heart disease. Reviews the medication, class of the drug, and side effects.   Pulmonary Rehab from 08/10/2018 in [facility] Cardiac and Pulmonary Rehab  Date  08/10/18  Educator  [facility]  Instruction Review Code  1- Verbalizes Understanding      Know Your Numbers and Risk Factors: -Group verbal and written instruction about important numbers in your health.  Discussion of what are risk factors and how they play a role in the disease process.  Review of Cholesterol, Blood Pressure, Diabetes, and BMI and the role they play in your overall health.   Pulmonary Rehab from 08/10/2018 in [facility] Cardiac and Pulmonary Rehab  Date  07/13/18  Educator  [facility]  Instruction Review Code  1- Verbalizes Understanding      Sleep Hygiene: -Provides group verbal and written instruction about how sleep can affect your health.  Define sleep hygiene, discuss sleep cycles and impact of sleep habits. Review good sleep hygiene tips.    Pulmonary Rehab from 08/10/2018 in [facility] Cardiac and Pulmonary Rehab  Date  04/11/18  Educator  [place]  Instruction Review Code  1- Verbalizes Understanding      Other: -Provides group and verbal instruction on various topics (see comments)    Knowledge Questionnaire Score: Knowledge Questionnaire Score - 07/13/18 1152      Knowledge Questionnaire Score   Pre Score  16/18    Post Score  15/18        Core Components/Risk Factors/Patient Goals at Admission: Personal Goals and Risk Factors at Admission - 05/30/18 1037      Core Components/Risk Factors/Patient Goals on Admission    Weight Management  Yes       Core Components/Risk Factors/Patient Goals Review:  Goals and Risk Factor Review    Row Name 04/27/18 1048 05/30/18 1038 06/22/18 1019 07/23/18 1656       Core Components/Risk Factors/Patient Goals Review   Personal Goals Review  Weight Management/Obesity;Improve shortness of breath with  ADL's;Hypertension  Weight Management/Obesity;Improve shortness of breath with ADL's;Other;Hypertension;Develop more efficient breathing techniques such as purse lipped breathing and diaphragmatic breathing and practicing self-pacing with activity.  Weight Management/Obesity;Improve shortness of breath with ADL's;Other;Hypertension  Weight Management/Obesity;Improve shortness of breath with ADL's;Other;Hypertension    Review  Stacie [name]'s weight is still fluctuating related to Prednisone. She is coming off of another round and hopes to be able to get back on track. Her blood pressure has been stable. She has noticed an improvement with her SOB already since starting [name]. She reported being able to walk up her stairs at home with little difficulty  Stacie [name] has continued to lose weight and is down 20 lb total - around 8-10 lb since starting LW.  She has noticed she can walk up stairs without "feeling like I m going to vomit".  She can also walk all the way down to the gym without stopping to rest.  She is taking all meds as directed.  She has noticed more overall strength.  She states her breathing is better but not as much an improvement as strength.  She uses PLB daily.    Stacie [name] continues to well with weight loss and was able to maintain her weight while on vacation.  Her blood pressures have been good in class and she checks it at the grocery store and on occasion at home.  She is doing well with her mediations.  She has noted an improvement in her SOB since starting the program, but not back to where she wants to be yet.   Patient has been doing well with her weight loss and continues to improve with her breathing. Her blood pressure has been good and she states her Sarcoidosis is in remission according to her doctor. Her ADL's have been getting better but she still has issues with walking for a long period of time.    Expected Outcomes  Short: Stacie [name] will continue to work on her SOB by attending exercise and work  on her goal weight. Long: reach goal weight of 150lb through exercise and healthy eating.   Short - continue to exercise and follow nutrition guidlines to lose weight Long - be able to do all ADLs without SOB or undue fatigue  Short: Continue to work on weight loss.  Long: Continue to improve SOB at home.   Short: Attend [name] regularly to improve shortness of breath with ADL's. Long: maintain independence with ADL's        Core Components/Risk Factors/Patient Goals at Discharge (Final Review):  Goals and Risk Factor Review - 07/23/18 1656  Core Components/Risk Factors/Patient Goals Review   Personal Goals Review  Weight Management/Obesity;Improve shortness of breath with ADL's;Other;Hypertension    Review  Patient has been doing well with her weight loss and continues to improve with her breathing. Her blood pressure has been good and she states her Sarcoidosis is in remission according to her doctor. Her ADL's have been getting better but she still has issues with walking for a long period of time.    Expected Outcomes  Short: Attend [name] regularly to improve shortness of breath with ADL's. Long: maintain independence with ADL's        ITP Comments: ITP Comments    Row Name 04/03/18 1038 04/16/18 0945 05/14/18 0844 06/11/18 0821 07/09/18 0825   ITP Comments  Medical Evaluation completed. Chart sent for review and changes to Dr. Caryl Comes for Dr. Emily Filbert Director of [place]. Diagnosis can be found in [facility] encounter 03/22/18  30 day review completed. ITP sent to Dr. Emily Filbert Director of [place]. Continue with ITP unless changes are made by physician  30 day review completed. ITP sent to Dr. Emily Filbert Director of [place]. Continue with ITP unless changes are made by physician  30 day review completed. ITP sent to Dr. Emily Filbert Director of [place]. Continue with ITP unless changes are made by physician  30 day review completed. ITP sent to Dr. Emily Filbert Director of  [place]. Continue with ITP unless changes are made by physician   Row Name 08/06/18 0830 08/10/18 1013         ITP Comments  30 day review completed. ITP sent to Dr. Emily Filbert Director of [place]. Continue with ITP unless changes are made by physician.  Discharge ITP sent and signed by Dr. Sabra Heck.  Discharge Summary routed to PCP and pulmonologist.         Comments: Discharge ITP

## 2018-08-10 NOTE — Patient Instructions (Signed)
Discharge Patient Instructions  Patient Details  Name: Stacie [name] MRN: [id number] Date of Birth: 02/14/1956 Referring Provider:  Donnald Garre, MD   Number of Visits: 36/36  Reason for Discharge:  Patient reached a stable level of exercise. Patient independent in their exercise. Patient has met program and personal goals.  Smoking History:  Social History   Tobacco Use  Smoking Status Never Smoker  Smokeless Tobacco Never Used    Diagnosis:  Pulmonary sarcoidosis ([place])  Initial Exercise Prescription: Initial Exercise Prescription - 04/03/18 1300      Date of Initial Exercise RX and Referring Provider   Date  04/03/18    Referring Provider  [place]      Treadmill   MPH  1.5    Grade  0    Minutes  15    METs  2.15      NuStep   Level  2    SPM  80    Minutes  15    METs  2.1      Biostep-RELP   Level  2    SPM  50    Minutes  15    METs  2      Prescription Details   Frequency (times per week)  3    Duration  Progress to 45 minutes of aerobic exercise without signs/symptoms of physical distress      Intensity   THRR 40-80% of Max Heartrate  135-150    Ratings of Perceived Exertion  11-15    Perceived Dyspnea  0-4      Resistance Training   Training Prescription  Yes    Weight  3 lb    Reps  10-15       Discharge Exercise Prescription (Final Exercise Prescription Changes): Exercise Prescription Changes - 07/25/18 1200      Response to Exercise   Blood Pressure (Admit)  142/82    Blood Pressure (Exit)  130/58    Heart Rate (Admit)  98 bpm    Heart Rate (Exercise)  116 bpm    Heart Rate (Exit)  101 bpm    Oxygen Saturation (Admit)  93 %    Oxygen Saturation (Exercise)  84 %    Oxygen Saturation (Exit)  91 %    Rating of Perceived Exertion (Exercise)  12    Perceived Dyspnea (Exercise)  3    Duration  Continue with 45 min of aerobic exercise without signs/symptoms of physical distress.    Intensity  THRR unchanged      Progression    Progression  Continue to progress workloads to maintain intensity without signs/symptoms of physical distress.    Average METs  2.3      Resistance Training   Training Prescription  Yes    Weight  3 lb    Reps  10-15      Interval Training   Interval Training  No      Treadmill   MPH  1.5    Grade  0    Minutes  15    METs  2.15      NuStep   Level  2    SPM  80    Minutes  15    METs  2.5       Functional Capacity: 6 Minute Walk    Row Name 04/03/18 1301 07/20/18 1103 08/01/18 1047     6 Minute Walk   Phase  -  Discharge  Discharge   Distance  786 feet  600 feet  425 feet   Walk Time  4.9 minutes  3 minutes  2.75 minutes   # of Rest Breaks  1  0  0   MPH  1.82  -  -   METS  2.96  2.25  -   RPE  _0 Perceived Dyspnea   _1 VO2 Peak  10.4  7.9  -   Symptoms  No  Yes (comment)  Yes (comment)   Comments  -  o2 dropped to 78 - bad breathing day due to weather  Saturation dropped to 79% again.  We will use 600 ft as post test results   Resting HR  121 bpm  130 bpm  97 bpm   Resting BP  128/66  124/64  130/66   Resting Oxygen Saturation   92 %  91 %  90 %   Exercise Oxygen Saturation  during 6 min walk  82 %  78 %  79 %   Max Ex. HR  133 bpm  131 bpm  111 bpm   Max Ex. BP  176/72  -  152/84   2 Minute Post BP  140/76  -  -     Interval HR   1 Minute HR  124  -  105   2 Minute HR  124  -  110   3 Minute HR  126  -  111   4 Minute HR  129  -  -   5 Minute HR  133  -  -   6 Minute HR  130  -  -   2 Minute Post HR  123  -  -   Interval Heart Rate?  Yes  -  Yes     Interval Oxygen   Interval Oxygen?  Yes  -  Yes   Baseline Oxygen Saturation %  92 %  -  90 %   1 Minute Oxygen Saturation %  93 %  -  87 %   1 Minute Liters of Oxygen  0 L  -  0 L Room Air   2 Minute Oxygen Saturation %  89 %  -  82 %   2 Minute Liters of Oxygen  0 L  -  0 L   3 Minute Oxygen Saturation %  85 %  -  79 % Test stopped at 2:44   3 Minute Liters of Oxygen  0 L  -  0 L    4 Minute Oxygen Saturation %  82 %  -  -   4 Minute Liters of Oxygen  0 L  -  -   5 Minute Oxygen Saturation %  86 %  -  -   5 Minute Liters of Oxygen  0 L  -  -   6 Minute Oxygen Saturation %  86 %  -  -   6 Minute Liters of Oxygen  0 L  -  -   2 Minute Post Oxygen Saturation %  93 %  -  -      Quality of Life:   Personal Goals: Goals established at orientation with interventions provided to work toward goal. Personal Goals and Risk Factors at Admission - 05/30/18 1037      Core Components/Risk Factors/Patient Goals on Admission    Weight Management  Yes        Personal Goals  Discharge: Goals and Risk Factor Review - 07/23/18 1656      Core Components/Risk Factors/Patient Goals Review   Personal Goals Review  Weight Management/Obesity;Improve shortness of breath with ADL's;Other;Hypertension    Review  Patient has been doing well with her weight loss and continues to improve with her breathing. Her blood pressure has been good and she states her Sarcoidosis is in remission according to her doctor. Her ADL's have been getting better but she still has issues with walking for a long period of time.    Expected Outcomes  Short: Attend [name] regularly to improve shortness of breath with ADL's. Long: maintain independence with ADL's        Exercise Goals and Review: Exercise Goals    Row Name 04/03/18 1300             Exercise Goals   Increase Physical Activity  Yes       Intervention  Provide advice, education, support and counseling about physical activity/exercise needs.;Develop an individualized exercise prescription for aerobic and resistive training based on initial evaluation findings, risk stratification, comorbidities and participant's personal goals.       Expected Outcomes  Short Term: Attend rehab on a regular basis to increase amount of physical activity.;Long Term: Add in home exercise to make exercise part of routine and to increase amount of physical  activity.;Long Term: Exercising regularly at least 3-5 days a week.       Increase Strength and Stamina  Yes       Intervention  Provide advice, education, support and counseling about physical activity/exercise needs.;Develop an individualized exercise prescription for aerobic and resistive training based on initial evaluation findings, risk stratification, comorbidities and participant's personal goals.       Expected Outcomes  Short Term: Increase workloads from initial exercise prescription for resistance, speed, and METs.;Short Term: Perform resistance training exercises routinely during rehab and add in resistance training at home;Long Term: Improve cardiorespiratory fitness, muscular endurance and strength as measured by increased METs and functional capacity (6MWT)       Able to understand and use rate of perceived exertion (RPE) scale  Yes       Intervention  Provide education and explanation on how to use RPE scale       Expected Outcomes  Short Term: Able to use RPE daily in rehab to express subjective intensity level;Long Term:  Able to use RPE to guide intensity level when exercising independently       Able to understand and use Dyspnea scale  Yes       Intervention  Provide education and explanation on how to use Dyspnea scale       Expected Outcomes  Short Term: Able to use Dyspnea scale daily in rehab to express subjective sense of shortness of breath during exertion;Long Term: Able to use Dyspnea scale to guide intensity level when exercising independently       Knowledge and understanding of Target Heart Rate Range (THRR)  Yes       Intervention  Provide education and explanation of THRR including how the numbers were predicted and where they are located for reference       Expected Outcomes  Short Term: Able to state/look up THRR;Short Term: Able to use daily as guideline for intensity in rehab;Long Term: Able to use THRR to govern intensity when exercising independently       Able  to check pulse independently  Yes       Intervention  Provide education and demonstration on how to check pulse in carotid and radial arteries.;Review the importance of being able to check your own pulse for safety during independent exercise       Expected Outcomes  Short Term: Able to explain why pulse checking is important during independent exercise;Long Term: Able to check pulse independently and accurately       Understanding of Exercise Prescription  Yes       Intervention  Provide education, explanation, and written materials on patient's individual exercise prescription       Expected Outcomes  Short Term: Able to explain program exercise prescription;Long Term: Able to explain home exercise prescription to exercise independently          Nutrition & Weight - Outcomes: Pre Biometrics - 04/03/18 1259      Pre Biometrics   Height  5' 6.5" (1.689 m)    Weight  198 lb 11.2 oz (90.1 kg)    Waist Circumference  39.5 inches    Hip Circumference  47.75 inches    Waist to Hip Ratio  0.83 %    BMI (Calculated)  31.59      Post Biometrics - 07/20/18 1102       Post  Biometrics   Height  5' 6.5" (1.689 m)    Weight  198 lb 11.2 oz (90.1 kg)    Waist Circumference  37 inches    Hip Circumference  44 inches    Waist to Hip Ratio  0.84 %    BMI (Calculated)  31.59       Nutrition: Nutrition Therapy & Goals - 05/07/18 1101      Nutrition Therapy   Diet  TLC    Protein (specify units)  8oz    Fiber  25 grams    Whole Grain Foods  3 servings    Saturated Fats  12 max. grams    Fruits and Vegetables  6 servings/day   8 ideal, currently working to increase her F&V intake   Sodium  2000 grams      Personal Nutrition Goals   Nutrition Goal  Continue to work on increasing your daily fruit and vegetable intake, with less of a focus on starch-heavy meals- great job!    Personal Goal #2  Continue to educate yourself on foods high in sodium and added sugar. Look for foods with less than  ~10g added sugar per serving and less than ~235m sodium per serving    Comments  She has been working with her health-conscious daughters to make changes to her diet in order to be healthier and lose weight. Reported wt loss of approx 12-15# to date, with an overall wt loss goal of 50#. She has been eating smaller more frequent meals and choosing more lean meats      Intervention Plan   Intervention  Prescribe, educate and counsel regarding individualized specific dietary modifications aiming towards targeted core components such as weight, hypertension, lipid management, diabetes, heart failure and other comorbidities.    Expected Outcomes  Short Term Goal: Understand basic principles of dietary content, such as calories, fat, sodium, cholesterol and nutrients.;Short Term Goal: A plan has been developed with personal nutrition goals set during dietitian appointment.;Long Term Goal: Adherence to prescribed nutrition plan.       Nutrition Discharge: Nutrition Assessments - 07/13/18 1152      MEDFICTS Scores   Pre Score  13    Post Score  6    Score Difference  -  7       Education Questionnaire Score: Knowledge Questionnaire Score - 07/13/18 1152      Knowledge Questionnaire Score   Pre Score  16/18    Post Score  15/18       Goals reviewed with patient; copy given to patient.

## 2018-08-10 NOTE — Progress Notes (Signed)
Daily Session Note  Patient Details  Name: Stacie [name] MRN: [id number] Date of Birth: Dec 06, 1955 Referring Provider:     Pulmonary Rehab from 04/03/2018 in [facility] Cardiac and Pulmonary Rehab  Referring Provider  [place]      Encounter Date: 08/10/2018  Check In: Session Check In - 08/10/18 3267      Check-In   Supervising physician immediately available to respond to emergencies  [name] immediately available ER MD    Physician(s)  Dr. Cherylann Banas and Corky Downs    Location  [facility]-Cardiac & Pulmonary Rehab    Staff Present  Justin Mend RCP,RRT,BSRT;Meredith Sherryll Burger, RN BSN;Leslie Annandale RN, BSN    Medication changes reported      No    Fall or balance concerns reported     No    Warm-up and Cool-down  Performed as group-led Higher education careers adviser Performed  Yes    VAD Patient?  No    PAD/SET Patient?  No      Pain Assessment   Currently in Pain?  No/denies          Social History   Tobacco Use  Smoking Status Never Smoker  Smokeless Tobacco Never Used    Goals Met:  Proper associated with RPD/PD & O2 Sat Independence with exercise equipment Achieving weight loss Exercise tolerated well No report of cardiac concerns or symptoms Strength training completed today  Goals Unmet:  Not Applicable  Comments:  Stacie [name] graduated today from  rehab with 36 sessions completed.  Details of the patient's exercise prescription and what She needs to do in order to continue the prescription and progress were discussed with patient.  Patient was given a copy of prescription and goals.  Patient verbalized understanding.  Stacie [name] plans to continue to exercise by [employer].   Dr. Emily Filbert is Medical Director for [place] and [name].

## 2018-08-10 NOTE — Progress Notes (Signed)
Discharge Progress Report  Patient Details  Name: Stacie [name] MRN: [id number] Date of Birth: 1956-03-03 Referring Provider:     Pulmonary Rehab from 04/03/2018 in [facility]  Referring Provider  [place]ack[place]       Number of Visits: 36/36  Reason for Discharge:  Patient reached a stable level of exercise. Patient independent in their exercise. Patient has met program and personal goals.  Smoking History:  Social History   Tobacco Use  Smoking Status Never Smoker  Smokeless Tobacco Never Used    Diagnosis:  Pulmonary sarcoidosis ([place])  ADL [facility]: Pulmonary Assessment Scores    Row Name 04/03/18 1108 07/13/18 1151 08/10/18 1011     ADL [facility]   ADL Phase  Entry  Exit  Exit   SOB Score total  80  53  -   Rest  2  1  -   Walk  3  3  -   Stairs  5  4  -   Bath  4  3  -   Dress  3  3  -   Shop  4  3  -     CAT Score   CAT Score  22  14  -     mMRC Score   mMRC Score  3  -  3      Initial Exercise Prescription: Initial Exercise Prescription - 04/03/18 1300      Date of Initial Exercise RX and Referring Provider   Date  04/03/18    Referring Provider  Rackley      Treadmill   MPH  1.5    Grade  0    Minutes  15    METs  2.15      NuStep   Level  2    SPM  80    Minutes  15    METs  2.1      Biostep-RELP   Level  2    SPM  50    Minutes  15    METs  2      Prescription Details   Frequency (times per week)  3    Duration  Progress to 45 minutes of aerobic exercise without signs/symptoms of physical distress      Intensity   THRR 40-80% of Max Heartrate  135-150    Ratings of Perceived Exertion  11-15    Perceived Dyspnea  0-4      Resistance Training   Training Prescription  Yes    Weight  3 lb    Reps  10-15       Discharge Exercise Prescription (Final Exercise Prescription Changes): Exercise Prescription Changes - 07/25/18 1200      Response to Exercise   Blood Pressure (Admit)  142/82    Blood Pressure (Exit)   130/58    Heart Rate (Admit)  98 bpm    Heart Rate (Exercise)  116 bpm    Heart Rate (Exit)  101 bpm    Oxygen Saturation (Admit)  93 %    Oxygen Saturation (Exercise)  84 %    Oxygen Saturation (Exit)  91 %    Rating of Perceived Exertion (Exercise)  12    Perceived Dyspnea (Exercise)  3    Duration  Continue with 45 min of aerobic exercise without signs/symptoms of physical distress.    Intensity  THRR unchanged      Progression   Progression  Continue to progress workloads to maintain  intensity without signs/symptoms of physical distress.    Average METs  2.3      Resistance Training   Training Prescription  Yes    Weight  3 lb    Reps  10-15      Interval Training   Interval Training  No      Treadmill   MPH  1.5    Grade  0    Minutes  15    METs  2.15      NuStep   Level  2    SPM  80    Minutes  15    METs  2.5       Functional Capacity: 6 Minute Walk    Row Name 04/03/18 1301 07/20/18 1103 08/01/18 1047     6 Minute Walk   Phase  -  Discharge  Discharge   Distance  786 feet  600 feet  425 feet   Walk Time  4.9 minutes  3 minutes  2.75 minutes   # of Rest Breaks  1  0  0   MPH  1.82  -  -   METS  2.96  2.25  -   RPE  _0 Perceived Dyspnea   _1 VO2 Peak  10.4  7.9  -   Symptoms  No  Yes (comment)  Yes (comment)   Comments  -  o2 dropped to 78 - bad breathing day due to weather  Saturation dropped to 79% again.  We will use 600 ft as post test results   Resting HR  121 bpm  130 bpm  97 bpm   Resting BP  128/66  124/64  130/66   Resting Oxygen Saturation   92 %  91 %  90 %   Exercise Oxygen Saturation  during 6 min walk  82 %  78 %  79 %   Max Ex. HR  133 bpm  131 bpm  111 bpm   Max Ex. BP  176/72  -  152/84   2 Minute Post BP  140/76  -  -     Interval HR   1 Minute HR  124  -  105   2 Minute HR  124  -  110   3 Minute HR  126  -  111   4 Minute HR  129  -  -   5 Minute HR  133  -  -   6 Minute HR  130  -  -   2 Minute Post HR   123  -  -   Interval Heart Rate?  Yes  -  Yes     Interval Oxygen   Interval Oxygen?  Yes  -  Yes   Baseline Oxygen Saturation %  92 %  -  90 %   1 Minute Oxygen Saturation %  93 %  -  87 %   1 Minute Liters of Oxygen  0 L  -  0 L Room Air   2 Minute Oxygen Saturation %  89 %  -  82 %   2 Minute Liters of Oxygen  0 L  -  0 L   3 Minute Oxygen Saturation %  85 %  -  79 % Test stopped at 2:44   3 Minute Liters of Oxygen  0 L  -  0 L   4 Minute Oxygen Saturation %  82 %  -  -   4 Minute Liters of Oxygen  0 L  -  -   5 Minute Oxygen Saturation %  86 %  -  -   5 Minute Liters of Oxygen  0 L  -  -   6 Minute Oxygen Saturation %  86 %  -  -   6 Minute Liters of Oxygen  0 L  -  -   2 Minute Post Oxygen Saturation %  93 %  -  -      Psychological, QOL, Others - Outcomes: PHQ 2/9: Depression screen [facility] 2/9 07/13/2018 06/21/2018 04/03/2018 04/02/2018 04/20/2017  Decreased Interest 0 0 3 1 0  Down, Depressed, Hopeless 0 0 1 1 0  PHQ - 2 Score 0 0 4 2 0  Altered sleeping 0 0 0 0 -  Tired, decreased energy _0 -  Change in appetite 0 0 1 0 -  Feeling bad or failure about yourself  0 0 2 0 -  Trouble concentrating _1 -  Moving slowly or fidgety/restless 0 0 0 0 -  Suicidal thoughts 0 0 0 0 -  PHQ-9 Score _2 -  Difficult doing work/chores Not difficult at all Not difficult at all Very difficult Not difficult at all -    Quality of Life:   Personal Goals: Goals established at orientation with interventions provided to work toward goal. Personal Goals and Risk Factors at Admission - 05/30/18 1037      Core Components/Risk Factors/Patient Goals on Admission    Weight Management  Yes        Personal Goals Discharge: Goals and Risk Factor Review    Row Name 04/27/18 1048 05/30/18 1038 06/22/18 1019 07/23/18 1656       Core Components/Risk Factors/Patient Goals Review   Personal Goals Review  Weight Management/Obesity;Improve shortness of breath with ADL's;Hypertension   Weight Management/Obesity;Improve shortness of breath with ADL's;Other;Hypertension;Develop more efficient breathing techniques such as purse lipped breathing and diaphragmatic breathing and practicing self-pacing with activity.  Weight Management/Obesity;Improve shortness of breath with ADL's;Other;Hypertension  Weight Management/Obesity;Improve shortness of breath with ADL's;Other;Hypertension    Review  Cathy's weight is still fluctuating related to Prednisone. She is coming off of another round and hopes to be able to get back on track. Her blood pressure has been stable. She has noticed an improvement with her SOB already since starting [name]. She reported being able to walk up her stairs at home with little difficulty  Tye [place] has continued to lose weight and is down 20 lb total - around 8-10 lb since starting LW.  She has noticed she can walk up stairs without "feeling like I m going to vomit".  She can also walk all the way down to the gym without stopping to rest.  She is taking all meds as directed.  She has noticed more overall strength.  She states her breathing is better but not as much an improvement as strength.  She uses PLB daily.    Tye [place] continues to well with weight loss and was able to maintain her weight while on vacation.  Her blood pressures have been good in class and she checks it at the grocery store and on occasion at home.  She is doing well with her mediations.  She has noted an improvement in her SOB since starting the program, but not back to where she wants to be yet.   Patient  has been doing well with her weight loss and continues to improve with her breathing. Her blood pressure has been good and she states her Sarcoidosis is in remission according to her doctor. Her ADL's have been getting better but she still has issues with walking for a long period of time.    Expected Outcomes  Short: Tye [place] will continue to work on her SOB by attending exercise and work on her goal weight.  Long: reach goal weight of 150lb through exercise and healthy eating.   Short - continue to exercise and follow nutrition guidlines to lose weight Long - be able to do all ADLs without SOB or undue fatigue  Short: Continue to work on weight loss.  Long: Continue to improve SOB at home.   Short: Attend [name] regularly to improve shortness of breath with ADL's. Long: maintain independence with ADL's        Exercise Goals and Review: Exercise Goals    Row Name 04/03/18 1300             Exercise Goals   Increase Physical Activity  Yes       Intervention  Provide advice, education, support and counseling about physical activity/exercise needs.;Develop an individualized exercise prescription for aerobic and resistive training based on initial evaluation findings, risk stratification, comorbidities and participant's personal goals.       Expected Outcomes  Short Term: Attend rehab on a regular basis to increase amount of physical activity.;Long Term: Add in home exercise to make exercise part of routine and to increase amount of physical activity.;Long Term: Exercising regularly at least 3-5 days a week.       Increase Strength and Stamina  Yes       Intervention  Provide advice, education, support and counseling about physical activity/exercise needs.;Develop an individualized exercise prescription for aerobic and resistive training based on initial evaluation findings, risk stratification, comorbidities and participant's personal goals.       Expected Outcomes  Short Term: Increase workloads from initial exercise prescription for resistance, speed, and METs.;Short Term: Perform resistance training exercises routinely during rehab and add in resistance training at home;Long Term: Improve cardiorespiratory fitness, muscular endurance and strength as measured by increased METs and functional capacity (6MWT)       Able to understand and use rate of perceived exertion (RPE) scale  Yes        Intervention  Provide education and explanation on how to use RPE scale       Expected Outcomes  Short Term: Able to use RPE daily in rehab to express subjective intensity level;Long Term:  Able to use RPE to guide intensity level when exercising independently       Able to understand and use Dyspnea scale  Yes       Intervention  Provide education and explanation on how to use Dyspnea scale       Expected Outcomes  Short Term: Able to use Dyspnea scale daily in rehab to express subjective sense of shortness of breath during exertion;Long Term: Able to use Dyspnea scale to guide intensity level when exercising independently       Knowledge and understanding of Target Heart Rate Range (THRR)  Yes       Intervention  Provide education and explanation of THRR including how the numbers were predicted and where they are located for reference       Expected Outcomes  Short Term: Able to state/look up THRR;Short Term: Able to use daily as guideline  for intensity in rehab;Long Term: Able to use THRR to govern intensity when exercising independently       Able to check pulse independently  Yes       Intervention  Provide education and demonstration on how to check pulse in carotid and radial arteries.;Review the importance of being able to check your own pulse for safety during independent exercise       Expected Outcomes  Short Term: Able to explain why pulse checking is important during independent exercise;Long Term: Able to check pulse independently and accurately       Understanding of Exercise Prescription  Yes       Intervention  Provide education, explanation, and written materials on patient's individual exercise prescription       Expected Outcomes  Short Term: Able to explain program exercise prescription;Long Term: Able to explain home exercise prescription to exercise independently          Nutrition & Weight - Outcomes: Pre Biometrics - 04/03/18 1259      Pre Biometrics   Height  5' 6.5"  (1.689 m)    Weight  198 lb 11.2 oz (90.1 kg)    Waist Circumference  39.5 inches    Hip Circumference  47.75 inches    Waist to Hip Ratio  0.83 %    BMI (Calculated)  31.59      Post Biometrics - 07/20/18 1102       Post  Biometrics   Height  5' 6.5" (1.689 m)    Weight  198 lb 11.2 oz (90.1 kg)    Waist Circumference  37 inches    Hip Circumference  44 inches    Waist to Hip Ratio  0.84 %    BMI (Calculated)  31.59       Nutrition: Nutrition Therapy & Goals - 05/07/18 1101      Nutrition Therapy   Diet  TLC    Protein (specify units)  8oz    Fiber  25 grams    Whole Grain Foods  3 servings    Saturated Fats  12 max. grams    Fruits and Vegetables  6 servings/day   8 ideal, currently working to increase her F&V intake   Sodium  2000 grams      Personal Nutrition Goals   Nutrition Goal  Continue to work on increasing your daily fruit and vegetable intake, with less of a focus on starch-heavy meals- great job!    Personal Goal #2  Continue to educate yourself on foods high in sodium and added sugar. Look for foods with less than ~10g added sugar per serving and less than ~231m sodium per serving    Comments  She has been working with her health-conscious daughters to make changes to her diet in order to be healthier and lose weight. Reported wt loss of approx 12-15# to date, with an overall wt loss goal of 50#. She has been eating smaller more frequent meals and choosing more lean meats      Intervention Plan   Intervention  Prescribe, educate and counsel regarding individualized specific dietary modifications aiming towards targeted core components such as weight, hypertension, lipid management, diabetes, heart failure and other comorbidities.    Expected Outcomes  Short Term Goal: Understand basic principles of dietary content, such as calories, fat, sodium, cholesterol and nutrients.;Short Term Goal: A plan has been developed with personal nutrition goals set during  dietitian appointment.;Long Term Goal: Adherence to prescribed nutrition plan.  Nutrition Discharge: Nutrition Assessments - 07/13/18 1152      MEDFICTS Scores   Pre Score  13    Post Score  6    Score Difference  -7       Education Questionnaire Score: Knowledge Questionnaire Score - 07/13/18 1152      Knowledge Questionnaire Score   Pre Score  16/18    Post Score  15/18       Goals reviewed with patient; copy given to patient.

## 2018-08-24 DIAGNOSIS — G4733 Obstructive sleep apnea (adult) (pediatric): Secondary | ICD-10-CM | POA: Diagnosis not present

## 2018-08-27 DIAGNOSIS — J8410 Pulmonary fibrosis, unspecified: Secondary | ICD-10-CM | POA: Diagnosis not present

## 2018-08-27 DIAGNOSIS — D869 Sarcoidosis, unspecified: Secondary | ICD-10-CM | POA: Diagnosis not present

## 2018-08-27 DIAGNOSIS — R5383 Other fatigue: Secondary | ICD-10-CM | POA: Diagnosis not present

## 2018-08-27 DIAGNOSIS — D860 Sarcoidosis of lung: Secondary | ICD-10-CM | POA: Diagnosis not present

## 2018-08-27 DIAGNOSIS — Z79899 Other long term (current) drug therapy: Secondary | ICD-10-CM | POA: Diagnosis not present

## 2018-08-27 DIAGNOSIS — R0602 Shortness of breath: Secondary | ICD-10-CM | POA: Diagnosis not present

## 2018-08-28 DIAGNOSIS — I071 Rheumatic tricuspid insufficiency: Secondary | ICD-10-CM | POA: Diagnosis not present

## 2018-08-28 DIAGNOSIS — G4733 Obstructive sleep apnea (adult) (pediatric): Secondary | ICD-10-CM | POA: Diagnosis not present

## 2018-08-28 DIAGNOSIS — D869 Sarcoidosis, unspecified: Secondary | ICD-10-CM | POA: Diagnosis not present

## 2018-08-28 DIAGNOSIS — R0602 Shortness of breath: Secondary | ICD-10-CM | POA: Diagnosis not present

## 2018-08-30 DIAGNOSIS — R0602 Shortness of breath: Secondary | ICD-10-CM | POA: Diagnosis not present

## 2018-08-30 DIAGNOSIS — J8410 Pulmonary fibrosis, unspecified: Secondary | ICD-10-CM | POA: Diagnosis not present

## 2018-08-30 DIAGNOSIS — D869 Sarcoidosis, unspecified: Secondary | ICD-10-CM | POA: Diagnosis not present

## 2018-09-07 DIAGNOSIS — H35373 Puckering of macula, bilateral: Secondary | ICD-10-CM | POA: Diagnosis not present

## 2018-09-07 DIAGNOSIS — H40003 Preglaucoma, unspecified, bilateral: Secondary | ICD-10-CM | POA: Diagnosis not present

## 2018-09-11 ENCOUNTER — Other Ambulatory Visit: Payer: Self-pay | Admitting: Internal Medicine

## 2018-09-11 DIAGNOSIS — F39 Unspecified mood [affective] disorder: Secondary | ICD-10-CM

## 2018-09-23 DIAGNOSIS — G4733 Obstructive sleep apnea (adult) (pediatric): Secondary | ICD-10-CM | POA: Diagnosis not present

## 2018-09-26 DIAGNOSIS — R0689 Other abnormalities of breathing: Secondary | ICD-10-CM | POA: Diagnosis not present

## 2018-09-26 DIAGNOSIS — R0602 Shortness of breath: Secondary | ICD-10-CM | POA: Diagnosis not present

## 2018-09-26 DIAGNOSIS — I272 Pulmonary hypertension, unspecified: Secondary | ICD-10-CM | POA: Diagnosis not present

## 2018-09-26 DIAGNOSIS — D869 Sarcoidosis, unspecified: Secondary | ICD-10-CM | POA: Diagnosis not present

## 2018-09-26 DIAGNOSIS — R931 Abnormal findings on diagnostic imaging of heart and coronary circulation: Secondary | ICD-10-CM | POA: Diagnosis not present

## 2018-09-26 DIAGNOSIS — Z79899 Other long term (current) drug therapy: Secondary | ICD-10-CM | POA: Diagnosis not present

## 2018-09-26 DIAGNOSIS — Z114 Encounter for screening for human immunodeficiency virus [HIV]: Secondary | ICD-10-CM | POA: Diagnosis not present

## 2018-09-26 DIAGNOSIS — G4733 Obstructive sleep apnea (adult) (pediatric): Secondary | ICD-10-CM | POA: Diagnosis not present

## 2018-09-26 DIAGNOSIS — R0600 Dyspnea, unspecified: Secondary | ICD-10-CM | POA: Diagnosis not present

## 2018-09-26 DIAGNOSIS — I270 Primary pulmonary hypertension: Secondary | ICD-10-CM | POA: Diagnosis not present

## 2018-09-26 DIAGNOSIS — I517 Cardiomegaly: Secondary | ICD-10-CM | POA: Diagnosis not present

## 2018-09-29 ENCOUNTER — Other Ambulatory Visit: Payer: Self-pay | Admitting: Internal Medicine

## 2018-10-18 DIAGNOSIS — R0602 Shortness of breath: Secondary | ICD-10-CM | POA: Diagnosis not present

## 2018-10-18 DIAGNOSIS — N183 Chronic kidney disease, stage 3 (moderate): Secondary | ICD-10-CM | POA: Diagnosis not present

## 2018-10-18 DIAGNOSIS — E039 Hypothyroidism, unspecified: Secondary | ICD-10-CM | POA: Diagnosis not present

## 2018-10-18 DIAGNOSIS — G4733 Obstructive sleep apnea (adult) (pediatric): Secondary | ICD-10-CM | POA: Diagnosis not present

## 2018-10-18 DIAGNOSIS — D860 Sarcoidosis of lung: Secondary | ICD-10-CM | POA: Diagnosis not present

## 2018-10-18 DIAGNOSIS — J9611 Chronic respiratory failure with hypoxia: Secondary | ICD-10-CM | POA: Diagnosis not present

## 2018-10-18 DIAGNOSIS — I2721 Secondary pulmonary arterial hypertension: Secondary | ICD-10-CM | POA: Diagnosis not present

## 2018-10-18 DIAGNOSIS — E785 Hyperlipidemia, unspecified: Secondary | ICD-10-CM | POA: Diagnosis not present

## 2018-10-18 HISTORY — PX: CARDIAC CATHETERIZATION: SHX172

## 2018-10-20 ENCOUNTER — Other Ambulatory Visit: Payer: Self-pay | Admitting: Internal Medicine

## 2018-10-22 ENCOUNTER — Ambulatory Visit (INDEPENDENT_AMBULATORY_CARE_PROVIDER_SITE_OTHER): Payer: BLUE CROSS/BLUE SHIELD | Admitting: Internal Medicine

## 2018-10-22 ENCOUNTER — Encounter: Payer: Self-pay | Admitting: Internal Medicine

## 2018-10-22 VITALS — BP 122/78 | HR 90 | Ht 66.5 in | Wt 178.6 lb

## 2018-10-22 DIAGNOSIS — F3341 Major depressive disorder, recurrent, in partial remission: Secondary | ICD-10-CM | POA: Diagnosis not present

## 2018-10-22 DIAGNOSIS — E039 Hypothyroidism, unspecified: Secondary | ICD-10-CM

## 2018-10-22 DIAGNOSIS — I10 Essential (primary) hypertension: Secondary | ICD-10-CM

## 2018-10-22 NOTE — Progress Notes (Signed)
Date:  10/22/2018   Name:  COURTENEY ALDERETE   DOB:  04-12-1956   MRN:  [id number]   Chief Complaint: Depression (Follow Up. PHQ9- ) and Hypertension  Hypertension  This is a chronic problem. The problem is controlled. Associated symptoms include shortness of breath. Pertinent negatives include no chest pain, headaches or palpitations. Past treatments include calcium channel blockers. The current treatment provides significant improvement. Identifiable causes of hypertension include a thyroid problem.  Depression         This is a new problem.  The current episode started more than 1 month ago.   The problem has been resolved since onset.  Associated symptoms include fatigue.  Associated symptoms include no headaches and no suicidal ideas.  Past treatments include SSRIs - Selective serotonin reuptake inhibitors.  Compliance with treatment is good.  Previous treatment provided significant relief.  Past medical history includes thyroid problem.   Thyroid Problem  Presents for follow-up visit. Symptoms include fatigue and weight loss. Patient reports no anxiety, diarrhea, heat intolerance, leg swelling, palpitations or visual change. The symptoms have been stable.  Sarcoidosis/shortness of breath - recent evaluation for possible PH.  Cardiac cath showed mild to moderate PH. She will follow up with specialist in the near future.   Review of Systems  Constitutional: Positive for fatigue, unexpected weight change (has been able to loss 20 lbs with exercise) and weight loss. Negative for chills and fever.  HENT: Negative for trouble swallowing.   Respiratory: Positive for chest tightness, shortness of breath and wheezing.   Cardiovascular: Negative for chest pain, palpitations and leg swelling.  Gastrointestinal: Negative for abdominal pain, diarrhea and nausea.  Endocrine: Negative for heat intolerance.  Neurological: Negative for dizziness, light-headedness and headaches.  Psychiatric/Behavioral:  Positive for depression. Negative for dysphoric mood, sleep disturbance and suicidal ideas. The patient is not nervous/anxious.     Patient Active Problem List   Diagnosis Date Noted  . Mood disorder ([place]) 06/05/2018  . Compression fx, thoracic spine ([place]) 12/04/2017  . Morbid obesity due to excess calories ([place]) 05/08/2017  . Hyperlipidemia, mixed 04/21/2017  . Elevated blood pressure reading 04/20/2017  . Cough variant asthma  vs UACS  02/20/2017  . Hypothyroidism, acquired 03/27/2015  . Essential hypertension 03/27/2015  . H/O abnormal cervical Papanicolaou smear 03/27/2015  . H/O renal calculi 03/27/2015  . Allergic rhinitis 01/07/2015  . Obstructive sleep apnea 09/29/2014  . Other malaise and fatigue 06/03/2014  . Asthma, chronic 02/24/2014  . Cough 04/30/2013  . Pulmonary sarcoidosis ([place]) 03/12/2013  . CKD (chronic kidney disease) stage 3, GFR 30-59 ml/min ([facility]) 03/12/2013  . Calcium blood increased 05/23/2012  . Besnier-Boeck disease 02/08/2011    Allergies  Allergen Reactions  . Nitrofurantoin Nausea Only  . Tramadol     Nausea and vomiting  . Sulfa Antibiotics Rash    As an infant    Past Surgical History:  Procedure Laterality Date  . COLON SURGERY     "colon was fused to bladder - operated on both"  . COLONOSCOPY  09/18/2007   diverticuli, no polyps  . COLONOSCOPY  05/26/2010   diverticuli, no polyps  . PARS PLANA VITRECTOMY Right 05/20/2015   Procedure: PARS PLANA VITRECTOMY WITH 25 GAUGE, laser;  Surgeon: Milus Height, MD;  Location: [facility] ORS;  Service: Ophthalmology;  Laterality: Right;  . PARTIAL HYSTERECTOMY  1990  . TUBAL LIGATION      Social History   Tobacco Use  . Smoking status: Never Smoker  .  Smokeless tobacco: Never Used  Substance Use Topics  . Alcohol use: No  . Drug use: No     Medication list has been reviewed and updated.  Current Meds  Medication Sig  . Abaloparatide (TYMLOS) 3120 MCG/1.56ML SOPN Inject into the skin.    Marland Kitchen albuterol (VENTOLIN HFA) 108 (90 Base) MCG/ACT inhaler Inhale 2 puffs into the lungs every 6 (six) hours as needed for wheezing.  Marland Kitchen amLODipine (NORVASC) 5 MG tablet Take 1 tablet (5 mg total) by mouth daily.  . budesonide-formoterol (SYMBICORT) 80-4.5 MCG/ACT inhaler Take 2 puffs first thing in am and then another 2 puffs about 12 hours later.  . cetirizine (ZYRTEC) 10 MG tablet Take 10 mg by mouth daily as needed.   Marland Kitchen escitalopram (LEXAPRO) 10 MG tablet TAKE 1 TABLET BY MOUTH EVERY DAY  . folic acid (FOLVITE) 1 MG tablet Take 1 mg by mouth daily.  Marland Kitchen GLUCOSAMINE PO Take 1 tablet by mouth daily.  Marland Kitchen HYDROcodone-acetaminophen (NORCO/VICODIN) 5-325 MG tablet Take 1 tablet by mouth every 4 (four) hours as needed.  . methotrexate (RHEUMATREX) 2.5 MG tablet Take 35 mg by mouth once a week.   . mometasone (NASONEX) 50 MCG/ACT nasal spray Place 1 spray into the nose 2 (two) times daily.  . Multiple Vitamin (MULTIVITAMIN) capsule Take 1 capsule by mouth 2 (two) times daily.   . NON FORMULARY CPAP nightly  . predniSONE (DELTASONE) 10 MG tablet Take 10 mg by mouth.   . Respiratory Therapy Supplies (FLUTTER) DEVI Use as directed  . SYNTHROID 137 MCG tablet TAKE 1 TABLET (137 MCG TOTAL) BY MOUTH DAILY BEFORE BREAKFAST.    PHQ 2/9 Scores 10/22/2018 07/13/2018 06/21/2018 04/03/2018  PHQ - 2 Score 0 0 0 4  PHQ- 9 Score - _0 Physical Exam Vitals signs and nursing note reviewed.  Constitutional:      General: She is not in acute distress.    Appearance: She is well-developed.  HENT:     Head: Normocephalic and atraumatic.  Cardiovascular:     Rate and Rhythm: Normal rate and regular rhythm.     Pulses: Normal pulses.  Pulmonary:     Effort: Pulmonary effort is normal. No respiratory distress.     Breath sounds: Wheezing present. No decreased breath sounds or rhonchi.  Musculoskeletal: Normal range of motion.     Right lower leg: No edema.     Left lower leg: No edema.  Skin:    General: Skin  is warm and dry.     Findings: No rash.  Neurological:     Mental Status: She is alert and oriented to person, place, and time.  Psychiatric:        Mood and Affect: Mood normal.        Speech: Speech normal.        Behavior: Behavior normal.        Thought Content: Thought content normal.        Cognition and Memory: Cognition normal.    Wt Readings from Last 3 Encounters:  10/22/18 178 lb 9.6 oz (81 kg)  07/24/18 181 lb (82.1 kg)  07/20/18 198 lb 11.2 oz (90.1 kg)    BP 122/78 (BP Location: Right Arm, Patient Position: Sitting, Cuff Size: Normal)   Pulse 90   Ht 5' 6.5" (1.689 m)   Wt 178 lb 9.6 oz (81 kg)   SpO2 93%   BMI 28.39 kg/m   Assessment and Plan: 1. Depression, major, recurrent,  in partial remission [facility]) Doing very well on lexapro - continue current therapy  2. Hypothyroidism, acquired Supplemented, last TSH slightly low - TSH  3. Essential hypertension Controlled on amlodipine   Partially dictated using Editor, commissioning. Any errors are unintentional.  Halina Maidens, MD [facility]  10/22/2018

## 2018-10-22 NOTE — Patient Instructions (Signed)
Ask your Pulmonologist about getting Shingrix vaccine.

## 2018-10-23 LAB — TSH: TSH: 0.228 u[IU]/mL — ABNORMAL LOW (ref 0.450–4.500)

## 2018-10-24 DIAGNOSIS — G4733 Obstructive sleep apnea (adult) (pediatric): Secondary | ICD-10-CM | POA: Diagnosis not present

## 2018-11-24 DIAGNOSIS — G4733 Obstructive sleep apnea (adult) (pediatric): Secondary | ICD-10-CM | POA: Diagnosis not present

## 2018-12-05 DIAGNOSIS — G4733 Obstructive sleep apnea (adult) (pediatric): Secondary | ICD-10-CM | POA: Diagnosis not present

## 2018-12-06 DIAGNOSIS — R0602 Shortness of breath: Secondary | ICD-10-CM | POA: Diagnosis not present

## 2018-12-06 DIAGNOSIS — E876 Hypokalemia: Secondary | ICD-10-CM | POA: Diagnosis not present

## 2018-12-06 DIAGNOSIS — D860 Sarcoidosis of lung: Secondary | ICD-10-CM | POA: Diagnosis not present

## 2018-12-06 DIAGNOSIS — R0902 Hypoxemia: Secondary | ICD-10-CM | POA: Diagnosis not present

## 2018-12-06 DIAGNOSIS — Z23 Encounter for immunization: Secondary | ICD-10-CM | POA: Diagnosis not present

## 2018-12-06 DIAGNOSIS — Z79899 Other long term (current) drug therapy: Secondary | ICD-10-CM | POA: Diagnosis not present

## 2018-12-06 DIAGNOSIS — I272 Pulmonary hypertension, unspecified: Secondary | ICD-10-CM | POA: Diagnosis not present

## 2018-12-16 ENCOUNTER — Other Ambulatory Visit: Payer: Self-pay | Admitting: Internal Medicine

## 2018-12-16 DIAGNOSIS — I10 Essential (primary) hypertension: Secondary | ICD-10-CM

## 2018-12-18 DIAGNOSIS — I272 Pulmonary hypertension, unspecified: Secondary | ICD-10-CM | POA: Insufficient documentation

## 2018-12-23 DIAGNOSIS — G4733 Obstructive sleep apnea (adult) (pediatric): Secondary | ICD-10-CM | POA: Diagnosis not present

## 2019-01-23 DIAGNOSIS — G4733 Obstructive sleep apnea (adult) (pediatric): Secondary | ICD-10-CM | POA: Diagnosis not present

## 2019-01-30 DIAGNOSIS — I270 Primary pulmonary hypertension: Secondary | ICD-10-CM | POA: Diagnosis not present

## 2019-02-22 DIAGNOSIS — G4733 Obstructive sleep apnea (adult) (pediatric): Secondary | ICD-10-CM | POA: Diagnosis not present

## 2019-03-25 DIAGNOSIS — G4733 Obstructive sleep apnea (adult) (pediatric): Secondary | ICD-10-CM | POA: Diagnosis not present

## 2019-03-27 DIAGNOSIS — G4733 Obstructive sleep apnea (adult) (pediatric): Secondary | ICD-10-CM | POA: Diagnosis not present

## 2019-03-30 ENCOUNTER — Other Ambulatory Visit: Payer: Self-pay | Admitting: Internal Medicine

## 2019-04-24 DIAGNOSIS — G4733 Obstructive sleep apnea (adult) (pediatric): Secondary | ICD-10-CM | POA: Diagnosis not present

## 2019-04-26 ENCOUNTER — Ambulatory Visit (INDEPENDENT_AMBULATORY_CARE_PROVIDER_SITE_OTHER): Payer: BC Managed Care – PPO | Admitting: Internal Medicine

## 2019-04-26 ENCOUNTER — Other Ambulatory Visit: Payer: Self-pay

## 2019-04-26 ENCOUNTER — Encounter: Payer: Self-pay | Admitting: Internal Medicine

## 2019-04-26 VITALS — BP 128/62 | HR 104 | Temp 97.7°F | Ht 66.5 in | Wt 190.0 lb

## 2019-04-26 DIAGNOSIS — I10 Essential (primary) hypertension: Secondary | ICD-10-CM | POA: Diagnosis not present

## 2019-04-26 DIAGNOSIS — E039 Hypothyroidism, unspecified: Secondary | ICD-10-CM

## 2019-04-26 DIAGNOSIS — F39 Unspecified mood [affective] disorder: Secondary | ICD-10-CM | POA: Diagnosis not present

## 2019-04-26 MED ORDER — VORTIOXETINE HBR 10 MG PO TABS: 10.0000 mg | ORAL_TABLET | Freq: Every day | ORAL | 0 refills | Status: DC

## 2019-04-26 NOTE — Progress Notes (Signed)
Date:  04/26/2019   Name:  Stacie [name]   DOB:  [birth year]   MRN:  [id number]   Chief Complaint: Depression (PHQ9- 16 )  Hypertension This is a chronic problem. The problem is controlled. Associated symptoms include shortness of breath. Pertinent negatives include no chest pain or palpitations. Identifiable causes of hypertension include a thyroid problem.  Depression        This is a recurrent problem.  The problem has been gradually worsening since onset.  Associated symptoms include no fatigue.     The symptoms are aggravated by social issues and family issues (separated from her husband due to covid - [place] the [place]).  Past treatments include SSRIs - Selective serotonin reuptake inhibitors.  Compliance with treatment is good.  Improvement on treatment: previously good response to therapy.  Past medical history includes thyroid problem.   Thyroid Problem Patient reports no anxiety, fatigue or palpitations.    Review of Systems  Constitutional: Positive for unexpected weight change. Negative for chills, fatigue and fever.  Respiratory: Positive for chest tightness and shortness of breath.   Cardiovascular: Negative for chest pain and palpitations.  Psychiatric/Behavioral: Positive for depression, dysphoric mood and sleep disturbance (sleeping too much). The patient is not nervous/anxious.     Patient Active Problem List   Diagnosis Date Noted  . Depression, major, recurrent, in partial remission ([place]) 10/22/2018  . Mood disorder ([place]) 06/05/2018  . Compression fx, thoracic spine ([place]) 12/04/2017  . Morbid obesity due to excess calories ([place]) 05/08/2017  . Hyperlipidemia, mixed 04/21/2017  . Elevated blood pressure reading 04/20/2017  . Cough variant asthma  vs UACS  02/20/2017  . Hypothyroidism, acquired 03/27/2015  . Essential hypertension 03/27/2015  . H/O abnormal cervical Papanicolaou smear 03/27/2015  . H/O renal calculi 03/27/2015  . Allergic  rhinitis 01/07/2015  . Obstructive sleep apnea 09/29/2014  . Other malaise and fatigue 06/03/2014  . Asthma, chronic 02/24/2014  . Cough 04/30/2013  . Pulmonary sarcoidosis ([place]) 03/12/2013  . CKD (chronic kidney disease) stage 3, GFR 30-59 ml/min (HCC) 03/12/2013  . Calcium blood increased 05/23/2012  . Besnier-Boeck disease 02/08/2011    Allergies  Allergen Reactions  . Nitrofurantoin Nausea Only  . Tramadol     Nausea and vomiting  . Sulfa Antibiotics Rash    As an infant    Past Surgical History:  Procedure Laterality Date  . COLON SURGERY     "colon was fused to bladder - operated on both"  . COLONOSCOPY  09/18/2007   diverticuli, no polyps  . COLONOSCOPY  05/26/2010   diverticuli, no polyps  . PARS PLANA VITRECTOMY Right 05/20/2015   Procedure: PARS PLANA VITRECTOMY WITH 25 GAUGE, laser;  Surgeon: Milus Height, MD;  Location: [facility] ORS;  Service: Ophthalmology;  Laterality: Right;  . PARTIAL HYSTERECTOMY  1990  . TUBAL LIGATION      Social History   Tobacco Use  . Smoking status: Never Smoker  . Smokeless tobacco: Never Used  Substance Use Topics  . Alcohol use: No  . Drug use: No     Medication list has been reviewed and updated.  Current Meds  Medication Sig  . Abaloparatide (TYMLOS) 3120 MCG/1.56ML SOPN Inject into the skin.  Marland Kitchen albuterol (VENTOLIN HFA) 108 (90 Base) MCG/ACT inhaler Inhale 2 puffs into the lungs every 6 (six) hours as needed for wheezing.  Marland Kitchen amLODipine (NORVASC) 5 MG tablet TAKE 1 TABLET BY MOUTH EVERY DAY  . budesonide-formoterol (SYMBICORT)  80-4.5 MCG/ACT inhaler Take 2 puffs first thing in am and then another 2 puffs about 12 hours later.  . cetirizine (ZYRTEC) 10 MG tablet Take 10 mg by mouth daily as needed.   Marland Kitchen escitalopram (LEXAPRO) 10 MG tablet TAKE 1 TABLET BY MOUTH EVERY DAY  . folic acid (FOLVITE) 1 MG tablet Take 1 mg by mouth daily.  Marland Kitchen GLUCOSAMINE PO Take 1 tablet by mouth daily.  Marland Kitchen HYDROcodone-acetaminophen  (NORCO/VICODIN) 5-325 MG tablet Take 1 tablet by mouth every 4 (four) hours as needed.  . mometasone (NASONEX) 50 MCG/ACT nasal spray Place 1 spray into the nose 2 (two) times daily.  . Multiple Vitamin (MULTIVITAMIN) capsule Take 1 capsule by mouth 2 (two) times daily.   . NON FORMULARY CPAP nightly  . predniSONE (DELTASONE) 10 MG tablet Take 10 mg by mouth.   . Respiratory Therapy Supplies (FLUTTER) DEVI Use as directed  . SYNTHROID 137 MCG tablet TAKE 1 TABLET (137 MCG TOTAL) BY MOUTH DAILY BEFORE BREAKFAST.  Marland Kitchen TREXALL 5 MG tablet Take 5 tablets by mouth every 7 (seven) days.    PHQ 2/9 Scores 04/26/2019 10/22/2018 07/13/2018 06/21/2018  PHQ - 2 Score 6 0 0 0  PHQ- 9 Score 16 - 2 3    BP Readings from Last 3 Encounters:  04/26/19 128/62  10/22/18 122/78  07/24/18 124/62    Physical Exam Vitals signs and nursing note reviewed.  Constitutional:      General: She is not in acute distress.    Appearance: She is well-developed.  HENT:     Head: Normocephalic and atraumatic.  Cardiovascular:     Rate and Rhythm: Normal rate and regular rhythm.  Pulmonary:     Effort: Pulmonary effort is normal. No respiratory distress.     Breath sounds: Decreased breath sounds present. No wheezing.  Musculoskeletal: Normal range of motion.  Skin:    General: Skin is warm and dry.     Findings: No rash.  Neurological:     Mental Status: She is alert and oriented to person, place, and time.  Psychiatric:        Attention and Perception: Attention normal.        Mood and Affect: Mood is depressed. Affect is not flat, tearful or inappropriate.        Speech: Speech normal.        Behavior: Behavior normal.        Thought Content: Thought content normal.        Cognition and Memory: Cognition normal.        Judgment: Judgment normal.     Wt Readings from Last 3 Encounters:  04/26/19 190 lb (86.2 kg)  10/22/18 178 lb 9.6 oz (81 kg)  07/24/18 181 lb (82.1 kg)    BP 128/62   Pulse (!) 104    Temp 97.7 F (36.5 C) (Temporal)   Ht 5' 6.5" (1.689 m)   Wt 190 lb (86.2 kg)   SpO2 92%   BMI 30.21 kg/m   Assessment and Plan: 1. Mood disorder (HCC) Stop lexapro - vortioxetine HBr (TRINTELLIX) 10 MG TABS tablet; Take 1 tablet (10 mg total) by mouth daily.  Dispense: 90 tablet; Refill: 0  2. Essential hypertension controlled  3. Hypothyroidism, acquired Supplemented - follow up to check labs   Partially dictated using Editor, commissioning. Any errors are unintentional.  Halina Maidens, MD [facility]  04/26/2019

## 2019-04-30 ENCOUNTER — Telehealth: Payer: Self-pay | Admitting: Internal Medicine

## 2019-04-30 ENCOUNTER — Other Ambulatory Visit: Payer: Self-pay | Admitting: Internal Medicine

## 2019-04-30 DIAGNOSIS — F39 Unspecified mood [affective] disorder: Secondary | ICD-10-CM

## 2019-04-30 MED ORDER — VORTIOXETINE HBR 10 MG PO TABS: 10.0000 mg | ORAL_TABLET | Freq: Every day | ORAL | 0 refills | Status: DC

## 2019-04-30 NOTE — Telephone Encounter (Signed)
Dr. Army Melia sent meds to [facility] [place], [place]. Will wait for PA information from Fax and then can complete if still needed. Thank you.

## 2019-04-30 NOTE — Telephone Encounter (Signed)
Pt is having a hard time getting her medication, was told by her pharmacy a prior auth was needed. She will be going out of town tomorrow and be gone for a couple of months, [facility]  At [address] [place], [place] [address]  .  vortioxetine HBr (TRINTELLIX) 10 MG TABS tablet [[id number]

## 2019-05-03 ENCOUNTER — Telehealth: Payer: Self-pay

## 2019-05-03 NOTE — Telephone Encounter (Signed)
Antonya [place]wn [place]: [id number]  Outcome Approved today  Effective from 05/03/2019 through 05/01/2022.  Patient and pharmacy informed.

## 2019-05-13 ENCOUNTER — Other Ambulatory Visit: Payer: Self-pay | Admitting: Internal Medicine

## 2019-05-13 ENCOUNTER — Telehealth: Payer: Self-pay

## 2019-05-13 NOTE — Telephone Encounter (Signed)
Patient called saying she started new medication for Trintellix a week ago. 1st day she took it she was vomiting. Then the days after that she took medication it caused her severe nausea, and diarrhea. She stopped the medication - with last dose being Friday and she is now taking the medication she was taking before. Her symptoms are now gone.  Please advise on next steps for pt.

## 2019-05-13 NOTE — Telephone Encounter (Signed)
Patient informed. Will take 2 per day. Monitor symptoms. Will call when running low for 20 mg Lexapro to be sent in.

## 2019-05-13 NOTE — Telephone Encounter (Signed)
I recommend increasing the dose of lexapro to 20 mg.  If she has plenty, then take 2 per day and call me if this helps and she has no side effects.  If she does not have enough, I will send in a new Rx.

## 2019-05-25 DIAGNOSIS — G4733 Obstructive sleep apnea (adult) (pediatric): Secondary | ICD-10-CM | POA: Diagnosis not present

## 2019-05-26 ENCOUNTER — Encounter: Payer: Self-pay | Admitting: Internal Medicine

## 2019-05-26 ENCOUNTER — Other Ambulatory Visit: Payer: Self-pay | Admitting: Internal Medicine

## 2019-05-27 ENCOUNTER — Other Ambulatory Visit: Payer: Self-pay

## 2019-05-27 ENCOUNTER — Encounter: Payer: Self-pay | Admitting: Internal Medicine

## 2019-05-27 ENCOUNTER — Ambulatory Visit (INDEPENDENT_AMBULATORY_CARE_PROVIDER_SITE_OTHER): Payer: BC Managed Care – PPO | Admitting: Internal Medicine

## 2019-05-27 VITALS — BP 140/80 | HR 92 | Ht 66.5 in | Wt 189.0 lb

## 2019-05-27 DIAGNOSIS — F3341 Major depressive disorder, recurrent, in partial remission: Secondary | ICD-10-CM

## 2019-05-27 DIAGNOSIS — Z23 Encounter for immunization: Secondary | ICD-10-CM | POA: Diagnosis not present

## 2019-05-27 DIAGNOSIS — Z0000 Encounter for general adult medical examination without abnormal findings: Secondary | ICD-10-CM

## 2019-05-27 DIAGNOSIS — Z1231 Encounter for screening mammogram for malignant neoplasm of breast: Secondary | ICD-10-CM | POA: Diagnosis not present

## 2019-05-27 DIAGNOSIS — I10 Essential (primary) hypertension: Secondary | ICD-10-CM

## 2019-05-27 DIAGNOSIS — E039 Hypothyroidism, unspecified: Secondary | ICD-10-CM

## 2019-05-27 DIAGNOSIS — Z1159 Encounter for screening for other viral diseases: Secondary | ICD-10-CM | POA: Diagnosis not present

## 2019-05-27 LAB — POCT URINALYSIS DIPSTICK
Bilirubin, UA: NEGATIVE
Glucose, UA: NEGATIVE
Ketones, UA: NEGATIVE
Leukocytes, UA: NEGATIVE
Nitrite, UA: NEGATIVE
Protein, UA: NEGATIVE
Spec Grav, UA: 1.01 (ref 1.010–1.025)
Urobilinogen, UA: 0.2 E.U./dL
pH, UA: 6.5 (ref 5.0–8.0)

## 2019-05-27 MED ORDER — ESCITALOPRAM OXALATE 20 MG PO TABS: 20.0000 mg | ORAL_TABLET | Freq: Every day | ORAL | 3 refills | Status: DC

## 2019-05-27 MED ORDER — AMLODIPINE BESYLATE 5 MG PO TABS: 5.0000 mg | ORAL_TABLET | Freq: Every day | ORAL | 1 refills | Status: DC

## 2019-05-27 NOTE — Progress Notes (Signed)
Date:  05/27/2019   Name:  Stacie [name]   DOB:  1956-01-25   MRN:  [id number]   Chief Complaint: Annual Exam (Breast Exam. No pap.) Stacie [name] is a 63 y.o. female who presents today for her Complete Annual Exam. She feels fairly well. She reports exercising some. She reports she is sleeping fairly well. She denies breast issues. She would like to get Shingrix.  Mammogram due September Colon done 2011  Depression        This is a chronic problem.  The problem has been resolved since onset.  Associated symptoms include fatigue.  Associated symptoms include no headaches.  Past treatments include SSRIs - Selective serotonin reuptake inhibitors.  Compliance with treatment is good.  Past medical history includes thyroid problem.   Thyroid Problem Presents for follow-up visit. Symptoms include fatigue. Patient reports no anxiety, constipation, diarrhea, palpitations or tremors. The symptoms have been stable.    Review of Systems  Constitutional: Positive for fatigue and unexpected weight change. Negative for chills and fever.  HENT: Negative for congestion, hearing loss, tinnitus, trouble swallowing and voice change.   Eyes: Negative for visual disturbance.  Respiratory: Positive for shortness of breath and wheezing. Negative for cough and chest tightness.   Cardiovascular: Negative for chest pain, palpitations and leg swelling.  Gastrointestinal: Negative for abdominal pain, constipation, diarrhea and vomiting.  Endocrine: Negative for polydipsia and polyuria.  Genitourinary: Negative for dysuria, frequency, genital sores, vaginal bleeding and vaginal discharge.  Musculoskeletal: Negative for arthralgias, gait problem and joint swelling.  Skin: Negative for color change and rash.  Allergic/Immunologic: Positive for environmental allergies.  Neurological: Negative for dizziness, tremors, light-headedness and headaches.  Hematological: Negative for adenopathy. Does not  bruise/bleed easily.  Psychiatric/Behavioral: Positive for depression. Negative for dysphoric mood and sleep disturbance. The patient is not nervous/anxious.     Patient Active Problem List   Diagnosis Date Noted  . Pulmonary hypertension ([place]) 12/18/2018  . Depression, major, recurrent, in partial remission ([place]) 10/22/2018  . Mood disorder ([place]) 06/05/2018  . Compression fx, thoracic spine ([place]) 12/04/2017  . Morbid obesity due to excess calories ([place]) 05/08/2017  . Hyperlipidemia, mixed 04/21/2017  . Elevated blood pressure reading 04/20/2017  . Cough variant asthma  vs UACS  02/20/2017  . Hypothyroidism, acquired 03/27/2015  . Essential hypertension 03/27/2015  . H/O abnormal cervical Papanicolaou smear 03/27/2015  . H/O renal calculi 03/27/2015  . Allergic rhinitis 01/07/2015  . Obstructive sleep apnea 09/29/2014  . Other malaise and fatigue 06/03/2014  . Asthma, chronic 02/24/2014  . Cough 04/30/2013  . Pulmonary sarcoidosis ([place]) 03/12/2013  . CKD (chronic kidney disease) stage 3, GFR 30-59 ml/min (HCC) 03/12/2013  . Calcium blood increased 05/23/2012  . Besnier-Boeck disease 02/08/2011    Allergies  Allergen Reactions  . Nitrofurantoin Nausea Only  . Tramadol     Nausea and vomiting  . Sulfa Antibiotics Rash    As an infant    Past Surgical History:  Procedure Laterality Date  . COLON SURGERY     "colon was fused to bladder - operated on both"  . COLONOSCOPY  09/18/2007   diverticuli, no polyps  . COLONOSCOPY  05/26/2010   diverticuli, no polyps  . PARS PLANA VITRECTOMY Right 05/20/2015   Procedure: PARS PLANA VITRECTOMY WITH 25 GAUGE, laser;  Surgeon: Milus Height, MD;  Location: [facility] ORS;  Service: Ophthalmology;  Laterality: Right;  . PARTIAL HYSTERECTOMY  1990  . TUBAL LIGATION  Social History   Tobacco Use  . Smoking status: Never Smoker  . Smokeless tobacco: Never Used  Substance Use Topics  . Alcohol use: No  . Drug use: No      Medication list has been reviewed and updated.  No outpatient medications have been marked as taking for the 05/27/19 encounter (Office Visit) with Glean Hess, MD.    [facility] 2/9 Scores 05/27/2019 04/26/2019 10/22/2018 07/13/2018  PHQ - 2 Score 0 6 0 0  PHQ- 9 Score 0 16 - 2    BP Readings from Last 3 Encounters:  05/27/19 140/80  04/26/19 128/62  10/22/18 122/78    Physical Exam Vitals signs and nursing note reviewed.  Constitutional:      General: She is not in acute distress.    Appearance: She is well-developed.  HENT:     Head: Normocephalic and atraumatic.     Right Ear: Tympanic membrane and ear canal normal.     Left Ear: Tympanic membrane and ear canal normal.     Nose:     Right Sinus: No maxillary sinus tenderness.     Left Sinus: No maxillary sinus tenderness.     Mouth/Throat:     Pharynx: Uvula midline.  Eyes:     General: No scleral icterus.       Right eye: No discharge.        Left eye: No discharge.     Conjunctiva/sclera: Conjunctivae normal.  Neck:     Musculoskeletal: Normal range of motion. No erythema.     Thyroid: No thyromegaly.     Vascular: No carotid bruit.  Cardiovascular:     Rate and Rhythm: Normal rate and regular rhythm.     Pulses: Normal pulses.     Heart sounds: Normal heart sounds.  Pulmonary:     Effort: Pulmonary effort is normal. No respiratory distress.     Breath sounds: Wheezing present.     Comments: And fine crackles at both bases Chest:     Breasts:        Right: No mass, nipple discharge, skin change or tenderness.        Left: No mass, nipple discharge, skin change or tenderness.  Abdominal:     General: Bowel sounds are normal.     Palpations: Abdomen is soft.     Tenderness: There is no abdominal tenderness.  Musculoskeletal: Normal range of motion.     Right lower leg: No edema.     Left lower leg: No edema.  Lymphadenopathy:     Cervical: No cervical adenopathy.  Skin:    General: Skin is warm and dry.      Capillary Refill: Capillary refill takes less than 2 seconds.     Findings: No rash.  Neurological:     Mental Status: She is alert and oriented to person, place, and time.     Cranial Nerves: No cranial nerve deficit.     Sensory: No sensory deficit.     Deep Tendon Reflexes: Reflexes are normal and symmetric.  Psychiatric:        Speech: Speech normal.        Behavior: Behavior normal.        Thought Content: Thought content normal.     Wt Readings from Last 3 Encounters:  05/27/19 189 lb (85.7 kg)  04/26/19 190 lb (86.2 kg)  10/22/18 178 lb 9.6 oz (81 kg)    BP 140/80   Pulse 92   Ht 5' 6.5" (  1.689 m)   Wt 189 lb (85.7 kg)   SpO2 (!) 85%   BMI 30.05 kg/m   Assessment and Plan: 1. Annual physical exam Normal exam except for pulmonary sx  - Lipid panel - POCT urinalysis dipstick  2. Essential hypertension controlled - amLODipine (NORVASC) 5 MG tablet; Take 1 tablet (5 mg total) by mouth daily.  Dispense: 90 tablet; Refill: 1 - CBC with Differential/Platelet - Comprehensive metabolic panel  3. Encounter for screening mammogram for breast cancer Schedule at [place] [facility]; Future  4. Hypothyroidism, acquired supplemented - TSH + free T4  5. Need for shingles vaccine First dose today - Varicella-zoster vaccine IM  6. Need for hepatitis C screening test - Hepatitis C antibody  7. Depression, major, recurrent, in partial remission (HCC) - escitalopram (LEXAPRO) 20 MG tablet; Take 1 tablet (20 mg total) by mouth daily.  Dispense: 90 tablet; Refill: 3   Partially dictated using Editor, commissioning. Any errors are unintentional.  Halina Maidens, MD [facility]  05/27/2019

## 2019-05-27 NOTE — Patient Instructions (Signed)
Health Maintenance, Female Adopting a healthy lifestyle and getting preventive care are important in promoting health and wellness. Ask your health care provider about:  The right schedule for you to have regular tests and exams.  Things you can do on your own to prevent diseases and keep yourself healthy. What should I know about diet, weight, and exercise? Eat a healthy diet   Eat a diet that includes plenty of vegetables, fruits, low-fat dairy products, and lean protein.  Do not eat a lot of foods that are high in solid fats, added sugars, or sodium. Maintain a healthy weight Body mass index (BMI) is used to identify weight problems. It estimates body fat based on height and weight. Your health care provider can help determine your BMI and help you achieve or maintain a healthy weight. Get regular exercise Get regular exercise. This is one of the most important things you can do for your health. Most adults should:  Exercise for at least 150 minutes each week. The exercise should increase your heart rate and make you sweat (moderate-intensity exercise).  Do strengthening exercises at least twice a week. This is in addition to the moderate-intensity exercise.  Spend less time sitting. Even light physical activity can be beneficial. Watch cholesterol and blood lipids Have your blood tested for lipids and cholesterol at 64 years of age, then have this test every 5 years. Have your cholesterol levels checked more often if:  Your lipid or cholesterol levels are high.  You are older than 64 years of age.  You are at high risk for heart disease. What should I know about cancer screening? Depending on your health history and family history, you may need to have cancer screening at various ages. This may include screening for:  Breast cancer.  Cervical cancer.  Colorectal cancer.  Skin cancer.  Lung cancer. What should I know about heart disease, diabetes, and high blood  pressure? Blood pressure and heart disease  High blood pressure causes heart disease and increases the risk of stroke. This is more likely to develop in people who have high blood pressure readings, are of African descent, or are overweight.  Have your blood pressure checked: ? Every 3-5 years if you are 54-62 years of age. ? Every year if you are 93 years old or older. Diabetes Have regular diabetes screenings. This checks your fasting blood sugar level. Have the screening done:  Once every three years after age 69 if you are at a normal weight and have a low risk for diabetes.  More often and at a younger age if you are overweight or have a high risk for diabetes. What should I know about preventing infection? Hepatitis B If you have a higher risk for hepatitis B, you should be screened for this virus. Talk with your health care provider to find out if you are at risk for hepatitis B infection. Hepatitis C Testing is recommended for:  Everyone born from [birth year] through 1965.  Anyone with known risk factors for hepatitis C. Sexually transmitted infections (STIs)  Get screened for STIs, including gonorrhea and chlamydia, if: ? You are sexually active and are younger than 64 years of age. ? You are older than 65 years of age and your health care provider tells you that you are at risk for this type of infection. ? Your sexual activity has changed since you were last screened, and you are at increased risk for chlamydia or gonorrhea. Ask your health care provider if  you are at risk.  Ask your health care provider about whether you are at high risk for HIV. Your health care provider may recommend a prescription medicine to help prevent HIV infection. If you choose to take medicine to prevent HIV, you should first get tested for HIV. You should then be tested every 3 months for as long as you are taking the medicine. Pregnancy  If you are about to stop having your period (premenopausal) and  you may become pregnant, seek counseling before you get pregnant.  Take 400 to 800 micrograms (mcg) of folic acid every day if you become pregnant.  Ask for birth control (contraception) if you want to prevent pregnancy. Osteoporosis and menopause Osteoporosis is a disease in which the bones lose minerals and strength with aging. This can result in bone fractures. If you are 59 years old or older, or if you are at risk for osteoporosis and fractures, ask your health care provider if you should:  Be screened for bone loss.  Take a calcium or vitamin D supplement to lower your risk of fractures.  Be given hormone replacement therapy (HRT) to treat symptoms of menopause. Follow these instructions at home: Lifestyle  Do not use any products that contain nicotine or tobacco, such as cigarettes, e-cigarettes, and chewing tobacco. If you need help quitting, ask your health care provider.  Do not use street drugs.  Do not share needles.  Ask your health care provider for help if you need support or information about quitting drugs. Alcohol use  Do not drink alcohol if: ? Your health care provider tells you not to drink. ? You are pregnant, may be pregnant, or are planning to become pregnant.  If you drink alcohol: ? Limit how much you use to 0-1 drink a day. ? Limit intake if you are breastfeeding.  Be aware of how much alcohol is in your drink. In the [place] one drink equals one 12 oz bottle of beer (355 mL), one 5 oz glass of wine (148 mL), or one 1 oz glass of hard liquor (44 mL). General instructions  Schedule regular health, dental, and eye exams.  Stay current with your vaccines.  Tell your health care provider if: ? You often feel depressed. ? You have ever been abused or do not feel safe at home. Summary  Adopting a healthy lifestyle and getting preventive care are important in promoting health and wellness.  Follow your health care provider's instructions about healthy  diet, exercising, and getting tested or screened for diseases.  Follow your health care provider's instructions on monitoring your cholesterol and blood pressure. This information is not intended to replace advice given to you by your health care provider. Make sure you discuss any questions you have with your health care provider. Document Released: 04/18/2011 Document Revised: 09/26/2018 Document Reviewed: 09/26/2018 Elsevier Patient Education  2020 [employer].

## 2019-05-28 LAB — LIPID PANEL
Chol/HDL Ratio: 3.6 ratio (ref 0.0–4.4)
Cholesterol, Total: 215 mg/dL — ABNORMAL HIGH (ref 100–199)
HDL: 59 mg/dL (ref >39)
LDL Calculated: 125 mg/dL — ABNORMAL HIGH (ref 0–99)
Triglycerides: 155 mg/dL — ABNORMAL HIGH (ref 0–149)
VLDL Cholesterol Cal: 31 mg/dL (ref 5–40)

## 2019-05-28 LAB — COMPREHENSIVE METABOLIC PANEL
ALT: 16 IU/L (ref 0–32)
AST: 23 IU/L (ref 0–40)
Albumin/Globulin Ratio: 1.5 (ref 1.2–2.2)
Albumin: 4.1 g/dL (ref 3.8–4.8)
Alkaline Phosphatase: 71 IU/L (ref 39–117)
BUN/Creatinine Ratio: 19 (ref 12–28)
BUN: 21 mg/dL (ref 8–27)
Bilirubin Total: 0.3 mg/dL (ref 0.0–1.2)
CO2: 28 mmol/L (ref 20–29)
Calcium: 9.6 mg/dL (ref 8.7–10.3)
Chloride: 101 mmol/L (ref 96–106)
Creatinine, Ser: 1.1 mg/dL — ABNORMAL HIGH (ref 0.57–1.00)
GFR calc Af Amer: 61 mL/min/{1.73_m2} (ref >59)
GFR calc non Af Amer: 53 mL/min/{1.73_m2} — ABNORMAL LOW (ref >59)
Globulin, Total: 2.8 g/dL (ref 1.5–4.5)
Glucose: 91 mg/dL (ref 65–99)
Potassium: 3.6 mmol/L (ref 3.5–5.2)
Sodium: 144 mmol/L (ref 134–144)
Total Protein: 6.9 g/dL (ref 6.0–8.5)

## 2019-05-28 LAB — CBC WITH DIFFERENTIAL/PLATELET
Basophils Absolute: 0.1 10*3/uL (ref 0.0–0.2)
Basos: 1 %
EOS (ABSOLUTE): 0.4 10*3/uL (ref 0.0–0.4)
Eos: 4 %
Hematocrit: 39.2 % (ref 34.0–46.6)
Hemoglobin: 13.4 g/dL (ref 11.1–15.9)
Immature Grans (Abs): 0 10*3/uL (ref 0.0–0.1)
Immature Granulocytes: 0 %
Lymphocytes Absolute: 1.6 10*3/uL (ref 0.7–3.1)
Lymphs: 15 %
MCH: 31 pg (ref 26.6–33.0)
MCHC: 34.2 g/dL (ref 31.5–35.7)
MCV: 91 fL (ref 79–97)
Monocytes Absolute: 1 10*3/uL — ABNORMAL HIGH (ref 0.1–0.9)
Monocytes: 10 %
Neutrophils Absolute: 7.3 10*3/uL — ABNORMAL HIGH (ref 1.4–7.0)
Neutrophils: 70 %
Platelets: 299 10*3/uL (ref 150–450)
RBC: 4.32 x10E6/uL (ref 3.77–5.28)
RDW: 13.1 % (ref 11.7–15.4)
WBC: 10.4 10*3/uL (ref 3.4–10.8)

## 2019-05-28 LAB — TSH+FREE T4
Free T4: 1.44 ng/dL (ref 0.82–1.77)
TSH: 0.12 u[IU]/mL — ABNORMAL LOW (ref 0.450–4.500)

## 2019-05-28 LAB — HEPATITIS C ANTIBODY: Hep C Virus Ab: <0.1 s/co ratio (ref 0.0–0.9)

## 2019-06-10 DIAGNOSIS — R05 Cough: Secondary | ICD-10-CM | POA: Diagnosis not present

## 2019-06-10 DIAGNOSIS — Z20828 Contact with and (suspected) exposure to other viral communicable diseases: Secondary | ICD-10-CM | POA: Diagnosis not present

## 2019-06-10 DIAGNOSIS — R0602 Shortness of breath: Secondary | ICD-10-CM | POA: Diagnosis not present

## 2019-06-18 DIAGNOSIS — H401131 Primary open-angle glaucoma, bilateral, mild stage: Secondary | ICD-10-CM | POA: Diagnosis not present

## 2019-06-18 DIAGNOSIS — H2512 Age-related nuclear cataract, left eye: Secondary | ICD-10-CM | POA: Diagnosis not present

## 2019-06-25 DIAGNOSIS — G4733 Obstructive sleep apnea (adult) (pediatric): Secondary | ICD-10-CM | POA: Diagnosis not present

## 2019-06-26 DIAGNOSIS — I270 Primary pulmonary hypertension: Secondary | ICD-10-CM | POA: Diagnosis not present

## 2019-06-26 DIAGNOSIS — R0600 Dyspnea, unspecified: Secondary | ICD-10-CM | POA: Diagnosis not present

## 2019-06-26 DIAGNOSIS — R0689 Other abnormalities of breathing: Secondary | ICD-10-CM | POA: Diagnosis not present

## 2019-06-26 DIAGNOSIS — Z23 Encounter for immunization: Secondary | ICD-10-CM | POA: Diagnosis not present

## 2019-06-28 DIAGNOSIS — H401131 Primary open-angle glaucoma, bilateral, mild stage: Secondary | ICD-10-CM | POA: Diagnosis not present

## 2019-07-01 ENCOUNTER — Other Ambulatory Visit: Payer: Self-pay | Admitting: Internal Medicine

## 2019-07-01 DIAGNOSIS — F39 Unspecified mood [affective] disorder: Secondary | ICD-10-CM

## 2019-07-11 ENCOUNTER — Other Ambulatory Visit: Payer: Self-pay | Admitting: Internal Medicine

## 2019-07-11 DIAGNOSIS — F39 Unspecified mood [affective] disorder: Secondary | ICD-10-CM

## 2019-07-24 DIAGNOSIS — D869 Sarcoidosis, unspecified: Secondary | ICD-10-CM | POA: Diagnosis not present

## 2019-07-24 DIAGNOSIS — H2512 Age-related nuclear cataract, left eye: Secondary | ICD-10-CM | POA: Diagnosis not present

## 2019-07-25 ENCOUNTER — Other Ambulatory Visit: Payer: Self-pay

## 2019-07-25 ENCOUNTER — Encounter: Payer: Self-pay | Admitting: *Deleted

## 2019-07-25 DIAGNOSIS — G4733 Obstructive sleep apnea (adult) (pediatric): Secondary | ICD-10-CM | POA: Diagnosis not present

## 2019-07-26 ENCOUNTER — Other Ambulatory Visit
Admission: RE | Admit: 2019-07-26 | Discharge: 2019-07-26 | Disposition: A | Discharge status: Home / Self Care | Payer: BC Managed Care – PPO | Source: Ambulatory Visit | Attending: Ophthalmology | Admitting: Ophthalmology

## 2019-07-26 DIAGNOSIS — Z20828 Contact with and (suspected) exposure to other viral communicable diseases: Secondary | ICD-10-CM | POA: Diagnosis not present

## 2019-07-26 DIAGNOSIS — Z01812 Encounter for preprocedural laboratory examination: Secondary | ICD-10-CM | POA: Diagnosis not present

## 2019-07-26 LAB — SARS CORONAVIRUS 2 (TAT 6-24 HRS): SARS Coronavirus 2: NEGATIVE

## 2019-07-29 NOTE — Discharge Instructions (Signed)
Cataract Surgery, Care After This sheet gives you information about how to care for yourself after your procedure. Your health care provider may also give you more specific instructions. If you have problems or questions, contact your health care provider. What can I expect after the procedure? After the procedure, it is common to have:  Itching.  Discomfort.  Fluid discharge.  Sensitivity to light and to touch.  Bruising in or around the eye.  Mild blurred vision. Follow these instructions at home: Eye care   Do not touch or rub your eyes.  Protect your eyes as told by your health care provider. You may be told to wear a protective eye shield or sunglasses.  Do not put a contact lens into the affected eye or eyes until your health care provider approves.  Keep the area around your eye clean and dry: ? Avoid swimming. ? Do not allow water to hit you directly in the face while showering. ? Keep soap and shampoo out of your eyes.  Check your eye every day for signs of infection. Watch for: ? Redness, swelling, or pain. ? Fluid, blood, or pus. ? Warmth. ? A bad smell. ? Vision that is getting worse. ? Sensitivity that is getting worse. Activity  Do not drive for 24 hours if you were given a sedative during your procedure.  Avoid strenuous activities, such as playing contact sports, for as long as told by your health care provider.  Do not drive or use heavy machinery until your health care provider approves.  Do not bend or lift heavy objects. Bending increases pressure in the eye. You can walk, climb stairs, and do light household chores.  Ask your health care provider when you can return to work. If you work in a dusty environment, you may be advised to wear protective eyewear for a period of time. General instructions  Take or apply over-the-counter and prescription medicines only as told by your health care provider. This includes eye drops.  Keep all follow-up  visits as told by your health care provider. This is important. Contact a health care provider if:  You have increased bruising around your eye.  You have pain that is not helped with medicine.  You have a fever.  You have redness, swelling, or pain in your eye.  You have fluid, blood, or pus coming from your incision.  Your vision gets worse.  Your sensitivity to light gets worse. Get help right away if:  You have sudden loss of vision.  You see flashes of light or spots (floaters).  You have severe eye pain.  You develop nausea or vomiting. Summary  After your procedure, it is common to have itching, discomfort, bruising, fluid discharge, or sensitivity to light.  Follow instructions from your health care provider about caring for your eye after the procedure.  Do not rub your eye after the procedure. You may need to wear eye protection or sunglasses. Do not wear contact lenses. Keep the area around your eye clean and dry.  Avoid activities that require a lot of effort. These include playing sports and lifting heavy objects.  Contact a health care provider if you have increased bruising, pain that does not go away, or a fever. Get help right away if you suddenly lose your vision, see flashes of light or spots, or have severe pain in the eye. This information is not intended to replace advice given to you by your health care provider. Make sure you discuss  any questions you have with your health care provider. Document Released: 04/22/2005 Document Revised: 04/02/2018 Document Reviewed: 04/02/2018 Elsevier Patient Education  2020 [place] Anesthesia, Adult, Care After This sheet gives you information about how to care for yourself after your procedure. Your health care provider may also give you more specific instructions. If you have problems or questions, contact your health care provider. What can I expect after the procedure? After the procedure, the  following side effects are common:  Pain or discomfort at the IV site.  Nausea.  Vomiting.  Sore throat.  Trouble concentrating.  Feeling cold or chills.  Weak or tired.  Sleepiness and fatigue.  Soreness and body aches. These side effects can affect parts of the body that were not involved in surgery. Follow these instructions at home:  For at least 24 hours after the procedure:  Have a responsible adult stay with you. It is important to have someone help care for you until you are awake and alert.  Rest as needed.  Do not: ? Participate in activities in which you could fall or become injured. ? Drive. ? Use heavy machinery. ? Drink alcohol. ? Take sleeping pills or medicines that cause drowsiness. ? Make important decisions or sign legal documents. ? Take care of children on your own. Eating and drinking  Follow any instructions from your health care provider about eating or drinking restrictions.  When you feel hungry, start by eating small amounts of foods that are soft and easy to digest (bland), such as toast. Gradually return to your regular diet.  Drink enough fluid to keep your urine pale yellow.  If you vomit, rehydrate by drinking water, juice, or clear broth. General instructions  If you have sleep apnea, surgery and certain medicines can increase your risk for breathing problems. Follow instructions from your health care provider about wearing your sleep device: ? Anytime you are sleeping, including during daytime naps. ? While taking prescription pain medicines, sleeping medicines, or medicines that make you drowsy.  Return to your normal activities as told by your health care provider. Ask your health care provider what activities are safe for you.  Take over-the-counter and prescription medicines only as told by your health care provider.  If you smoke, do not smoke without supervision.  Keep all follow-up visits as told by your health care  provider. This is important. Contact a health care provider if:  You have nausea or vomiting that does not get better with medicine.  You cannot eat or drink without vomiting.  You have pain that does not get better with medicine.  You are unable to pass urine.  You develop a skin rash.  You have a fever.  You have redness around your IV site that gets worse. Get help right away if:  You have difficulty breathing.  You have chest pain.  You have blood in your urine or stool, or you vomit blood. Summary  After the procedure, it is common to have a sore throat or nausea. It is also common to feel tired.  Have a responsible adult stay with you for the first 24 hours after general anesthesia. It is important to have someone help care for you until you are awake and alert.  When you feel hungry, start by eating small amounts of foods that are soft and easy to digest (bland), such as toast. Gradually return to your regular diet.  Drink enough fluid to keep your urine pale yellow.  Return to your normal activities as told by your health care provider. Ask your health care provider what activities are safe for you. This information is not intended to replace advice given to you by your health care provider. Make sure you discuss any questions you have with your health care provider. Document Released: 01/09/2001 Document Revised: 10/06/2017 Document Reviewed: 05/19/2017 Elsevier Patient Education  2020 [employer].

## 2019-07-30 NOTE — Anesthesia Preprocedure Evaluation (Addendum)
Anesthesia Evaluation  Patient identified by MRN, date of birth, ID band Patient awake    Reviewed: Allergy & Precautions, NPO status , Patient's Chart, lab work & pertinent test results  History of Anesthesia Complications Negative for: history of anesthetic complications  Airway Mallampati: IV   Neck ROM: Full    Dental no notable dental hx.    Pulmonary asthma , sleep apnea and Continuous Positive Airway Pressure Ventilation ,  Sarcoidosis, on prednisone 10m daily   Pulmonary exam normal breath sounds clear to auscultation       Cardiovascular hypertension, Normal cardiovascular exam Rhythm:Regular Rate:Normal  Pulmonary HTN   Neuro/Psych negative neurological ROS     GI/Hepatic negative GI ROS,   Endo/Other  Hypothyroidism   Renal/GU negative Renal ROS     Musculoskeletal   Abdominal   Peds  Hematology negative hematology ROS (+)   Anesthesia Other Findings Pulmonology note 06/26/19:  Assessment and Plan: Ms. JBurnsworthis a 63y.o. female with a h/o sarcoidosis, chronic hypoxic respiratory failure, and chronic steroid use who presents for return PVDC visit. She underwent RHC on 10/18/2018 (RA 7, RV 56/7, PA 56/30 (36), PCW 12, CO 4.5, CI 2.4, PVR 5.3) and started on sildenafil - later discontinued but now with worsening shortness of breath. She has been ambulating around the house Discussed wearing appropriate O2 - at least 2 liters when ambulating as well as watching SpO2 during exertion to ensure no further drops below 88%. She is willing to restart sildenafil though if she has any worsening SOB or dizziness will D/C and trial Tyvaso instead. Plan to return in 3 months.  -Restart sildenafil 20 mg TID -Failing PDEi tolerance will start Tyvaso -Encouraged continued exercise at home -Encouraged use of O2 during exertion -RTC in 3 months   Reproductive/Obstetrics                             Anesthesia Physical Anesthesia Plan  ASA: III  Anesthesia Plan: MAC   Post-op Pain Management:    Induction: Intravenous  PONV Risk Score and Plan: 2 and TIVA and Midazolam  Airway Management Planned: Natural Airway  Additional Equipment:   Intra-op Plan:   Post-operative Plan:   Informed Consent: I have reviewed the patients History and Physical, chart, labs and discussed the procedure including the risks, benefits and alternatives for the proposed anesthesia with the patient or authorized representative who has indicated his/her understanding and acceptance.       Plan Discussed with: CRNA  Anesthesia Plan Comments:        Anesthesia Quick Evaluation

## 2019-07-31 ENCOUNTER — Encounter
Admission: RE | Disposition: A | Discharge status: Home / Self Care | Payer: Self-pay | Source: Ambulatory Visit | Attending: Ophthalmology

## 2019-07-31 ENCOUNTER — Ambulatory Visit: Payer: BC Managed Care – PPO | Admitting: Anesthesiology

## 2019-07-31 ENCOUNTER — Ambulatory Visit
Admission: RE | Admit: 2019-07-31 | Discharge: 2019-07-31 | Disposition: A | Discharge status: Home / Self Care | Payer: BC Managed Care – PPO | Source: Ambulatory Visit | Attending: Ophthalmology | Admitting: Ophthalmology

## 2019-07-31 ENCOUNTER — Other Ambulatory Visit: Payer: Self-pay

## 2019-07-31 DIAGNOSIS — Z79890 Hormone replacement therapy (postmenopausal): Secondary | ICD-10-CM | POA: Insufficient documentation

## 2019-07-31 DIAGNOSIS — Z7951 Long term (current) use of inhaled steroids: Secondary | ICD-10-CM | POA: Insufficient documentation

## 2019-07-31 DIAGNOSIS — Z9049 Acquired absence of other specified parts of digestive tract: Secondary | ICD-10-CM | POA: Insufficient documentation

## 2019-07-31 DIAGNOSIS — G4730 Sleep apnea, unspecified: Secondary | ICD-10-CM | POA: Insufficient documentation

## 2019-07-31 DIAGNOSIS — D869 Sarcoidosis, unspecified: Secondary | ICD-10-CM | POA: Diagnosis not present

## 2019-07-31 DIAGNOSIS — H2512 Age-related nuclear cataract, left eye: Secondary | ICD-10-CM | POA: Insufficient documentation

## 2019-07-31 DIAGNOSIS — I10 Essential (primary) hypertension: Secondary | ICD-10-CM | POA: Insufficient documentation

## 2019-07-31 DIAGNOSIS — Z7952 Long term (current) use of systemic steroids: Secondary | ICD-10-CM | POA: Insufficient documentation

## 2019-07-31 DIAGNOSIS — J45909 Unspecified asthma, uncomplicated: Secondary | ICD-10-CM | POA: Insufficient documentation

## 2019-07-31 DIAGNOSIS — Z79899 Other long term (current) drug therapy: Secondary | ICD-10-CM | POA: Diagnosis not present

## 2019-07-31 DIAGNOSIS — E039 Hypothyroidism, unspecified: Secondary | ICD-10-CM | POA: Insufficient documentation

## 2019-07-31 DIAGNOSIS — H25812 Combined forms of age-related cataract, left eye: Secondary | ICD-10-CM | POA: Diagnosis not present

## 2019-07-31 HISTORY — DX: Pulmonary hypertension, unspecified: I27.20

## 2019-07-31 HISTORY — DX: Presence of external hearing-aid: Z97.4

## 2019-07-31 HISTORY — PX: CATARACT EXTRACTION W/PHACO: SHX586

## 2019-07-31 SURGERY — PHACOEMULSIFICATION, CATARACT, WITH IOL INSERTION
Anesthesia: Monitor Anesthesia Care | Site: Eye | Laterality: Left

## 2019-07-31 MED ORDER — CEFUROXIME OPHTHALMIC INJECTION 1 MG/0.1 ML
INJECTION | OPHTHALMIC | Status: DC | PRN
  Administered 2019-07-31: 0.1 mL via INTRACAMERAL

## 2019-07-31 MED ORDER — ONDANSETRON HCL 4 MG/2ML IJ SOLN
4.0000 mg | Freq: Once | INTRAMUSCULAR | Status: AC | PRN
  Administered 2019-07-31: 4 mg via INTRAVENOUS

## 2019-07-31 MED ORDER — LIDOCAINE HCL (PF) 2 % IJ SOLN
INTRAOCULAR | Status: DC | PRN
  Administered 2019-07-31: 1 mL

## 2019-07-31 MED ORDER — FENTANYL CITRATE (PF) 100 MCG/2ML IJ SOLN
INTRAMUSCULAR | Status: DC | PRN
  Administered 2019-07-31: 50 ug via INTRAVENOUS

## 2019-07-31 MED ORDER — ARMC OPHTHALMIC DILATING DROPS
1.0000 "application " | OPHTHALMIC | Status: DC | PRN
  Administered 2019-07-31 (×3): 1 via OPHTHALMIC

## 2019-07-31 MED ORDER — NA HYALUR & NA CHOND-NA HYALUR 0.4-0.35 ML IO KIT
PACK | INTRAOCULAR | Status: DC | PRN
  Administered 2019-07-31: 1 mL via INTRAOCULAR

## 2019-07-31 MED ORDER — EPINEPHRINE PF 1 MG/ML IJ SOLN
INTRAOCULAR | Status: DC | PRN
  Administered 2019-07-31: 63 mL via OPHTHALMIC

## 2019-07-31 MED ORDER — BRIMONIDINE TARTRATE-TIMOLOL 0.2-0.5 % OP SOLN
OPHTHALMIC | Status: DC | PRN
  Administered 2019-07-31: 1 [drp] via OPHTHALMIC

## 2019-07-31 MED ORDER — LACTATED RINGERS IV SOLN: INTRAVENOUS | Status: DC

## 2019-07-31 MED ORDER — MOXIFLOXACIN HCL 0.5 % OP SOLN
1.0000 [drp] | OPHTHALMIC | Status: DC | PRN
  Administered 2019-07-31 (×3): 1 [drp] via OPHTHALMIC

## 2019-07-31 MED ORDER — MIDAZOLAM HCL 2 MG/2ML IJ SOLN
INTRAMUSCULAR | Status: DC | PRN
  Administered 2019-07-31: 1 mg via INTRAVENOUS

## 2019-07-31 MED ORDER — TETRACAINE HCL 0.5 % OP SOLN
1.0000 [drp] | OPHTHALMIC | Status: DC | PRN
  Administered 2019-07-31 (×3): 1 [drp] via OPHTHALMIC

## 2019-07-31 SURGICAL SUPPLY — 17 items
CANNULA ANT/CHMB 27G (MISCELLANEOUS) ×1 IMPLANT
CANNULA ANT/CHMB 27GA (MISCELLANEOUS) ×2 IMPLANT
GLOVE SURG LX 7.5 STRW (GLOVE) ×1
GLOVE SURG LX STRL 7.5 STRW (GLOVE) ×1 IMPLANT
GLOVE SURG TRIUMPH 8.0 PF LTX (GLOVE) ×2 IMPLANT
GOWN STRL REUS W/ TWL LRG LVL3 (GOWN DISPOSABLE) ×2 IMPLANT
GOWN STRL REUS W/TWL LRG LVL3 (GOWN DISPOSABLE) ×2
LENS IOL ACRSF IQ ULTRA 18.5 (Intraocular Lens) IMPLANT
LENS IOL ACRYSOF IQ 18.5 (Intraocular Lens) ×2 IMPLANT
MARKER SKIN DUAL TIP RULER LAB (MISCELLANEOUS) ×2 IMPLANT
PACK CATARACT BRASINGTON (MISCELLANEOUS) ×2 IMPLANT
PACK EYE AFTER SURG (MISCELLANEOUS) ×2 IMPLANT
PACK OPTHALMIC (MISCELLANEOUS) ×2 IMPLANT
SYR 3ML LL SCALE MARK (SYRINGE) ×2 IMPLANT
SYR TB 1ML LUER SLIP (SYRINGE) ×2 IMPLANT
WATER STERILE IRR 500ML POUR (IV SOLUTION) ×2 IMPLANT
WIPE NON LINTING 3.25X3.25 (MISCELLANEOUS) ×2 IMPLANT

## 2019-07-31 NOTE — Anesthesia Procedure Notes (Signed)
Procedure Name: MAC Date/Time: 07/31/2019 7:59 AM Performed by: Cameron Ali, CRNA Pre-anesthesia Checklist: Patient identified, Emergency Drugs available, Suction available, Timeout performed and Patient being monitored Patient Re-evaluated:Patient Re-evaluated prior to induction Oxygen Delivery Method: Nasal cannula Placement Confirmation: positive ETCO2

## 2019-07-31 NOTE — Transfer of Care (Signed)
Immediate Anesthesia Transfer of Care Note  Patient: Stacie [name]  Procedure(s) Performed: CATARACT EXTRACTION PHACO AND INTRAOCULAR LENS PLACEMENT (IOC) LEFT 00:51.1  17.9%  9.15 (Left Eye)  Patient Location: PACU  Anesthesia Type: MAC  Level of Consciousness: awake, alert  and patient cooperative  Airway and Oxygen Therapy: Patient Spontanous Breathing and Patient connected to supplemental oxygen  Post-op Assessment: Post-op Vital signs reviewed, Patient's Cardiovascular Status Stable, Respiratory Function Stable, Patent Airway and No signs of Nausea or vomiting  Post-op Vital Signs: Reviewed and stable  Complications: No apparent anesthesia complications

## 2019-07-31 NOTE — Op Note (Signed)
OPERATIVE NOTE  LUNETTE TAPP [id number] 07/31/2019   PREOPERATIVE DIAGNOSIS:  Nuclear sclerotic cataract left eye. H25.12   POSTOPERATIVE DIAGNOSIS:    Nuclear sclerotic cataract left eye.     PROCEDURE:  Phacoemusification with posterior chamber intraocular lens placement of the left eye  Ultrasound time: Procedure(s) with comments: CATARACT EXTRACTION PHACO AND INTRAOCULAR LENS PLACEMENT (IOC) LEFT 00:51.1  17.9%  9.15 (Left) - keep this patient second  LENS:   Implant Name Type Inv. Item Serial No. Manufacturer Lot No. LRB No. Used Action  LENS IOL ACRYSOF IQ 18.5 - [id number] Intraocular Lens LENS IOL ACRYSOF IQ 18.5 [id number] ALCON  Left 1 Implanted      SURGEON:  Wyonia Hough, MD   ANESTHESIA:  Topical with tetracaine drops and 2% Xylocaine jelly, augmented with 1% preservative-free intracameral lidocaine.    COMPLICATIONS:  None.   DESCRIPTION OF PROCEDURE:  The patient was identified in the holding room and transported to the operating room and placed in the supine position under the operating microscope.  The left eye was identified as the operative eye and it was prepped and draped in the usual sterile ophthalmic fashion.   A 1 millimeter clear-corneal paracentesis was made at the 1:30 position.  0.5 ml of preservative-free 1% lidocaine was injected into the anterior chamber.  The anterior chamber was filled with Viscoat viscoelastic.  A 2.4 millimeter keratome was used to make a near-clear corneal incision at the 10:30 position.  .  A curvilinear capsulorrhexis was made with a cystotome and capsulorrhexis forceps.  Balanced salt solution was used to hydrodissect and hydrodelineate the nucleus.   Phacoemulsification was then used in stop and chop fashion to remove the lens nucleus and epinucleus.  The remaining cortex was then removed using the irrigation and aspiration handpiece. Provisc was then placed into the capsular bag to distend it for lens  placement.  A lens was then injected into the capsular bag.  The remaining viscoelastic was aspirated.   Wounds were hydrated with balanced salt solution.  The anterior chamber was inflated to a physiologic pressure with balanced salt solution.  No wound leaks were noted. Cefuroxime 0.1 ml of a 49m/ml solution was injected into the anterior chamber for a dose of 1 mg of intracameral antibiotic at the completion of the case.   Timolol and Brimonidine drops were applied to the eye.  The patient was taken to the recovery room in stable condition without complications of anesthesia or surgery.  Kamerin Grumbine 07/31/2019, 8:19 AM

## 2019-07-31 NOTE — Anesthesia Postprocedure Evaluation (Signed)
Anesthesia Post Note  Patient: Stacie [name]  Procedure(s) Performed: CATARACT EXTRACTION PHACO AND INTRAOCULAR LENS PLACEMENT (IOC) LEFT 00:51.1  17.9%  9.15 (Left Eye)  Patient location during evaluation: PACU Anesthesia Type: MAC Level of consciousness: awake and alert, oriented and patient cooperative Pain management: pain level controlled Vital Signs Assessment: post-procedure vital signs reviewed and stable Respiratory status: spontaneous breathing, nonlabored ventilation and respiratory function stable Cardiovascular status: blood pressure returned to baseline and stable Postop Assessment: adequate PO intake Anesthetic complications: no    Darrin Nipper

## 2019-07-31 NOTE — H&P (Signed)

## 2019-08-01 ENCOUNTER — Encounter: Payer: Self-pay | Admitting: Ophthalmology

## 2019-08-08 ENCOUNTER — Ambulatory Visit
Admission: RE | Admit: 2019-08-08 | Discharge: 2019-08-08 | Disposition: A | Discharge status: Home / Self Care | Payer: BC Managed Care – PPO | Source: Ambulatory Visit | Attending: Internal Medicine | Admitting: Internal Medicine

## 2019-08-08 DIAGNOSIS — Z1231 Encounter for screening mammogram for malignant neoplasm of breast: Secondary | ICD-10-CM | POA: Diagnosis not present

## 2019-08-09 ENCOUNTER — Ambulatory Visit: Payer: BC Managed Care – PPO

## 2019-08-12 ENCOUNTER — Ambulatory Visit: Payer: BC Managed Care – PPO

## 2019-08-25 DIAGNOSIS — G4733 Obstructive sleep apnea (adult) (pediatric): Secondary | ICD-10-CM | POA: Diagnosis not present

## 2019-09-24 DIAGNOSIS — G4733 Obstructive sleep apnea (adult) (pediatric): Secondary | ICD-10-CM | POA: Diagnosis not present

## 2019-10-14 ENCOUNTER — Other Ambulatory Visit: Payer: Self-pay | Admitting: Internal Medicine

## 2019-10-14 DIAGNOSIS — F39 Unspecified mood [affective] disorder: Secondary | ICD-10-CM

## 2019-10-18 DIAGNOSIS — I639 Cerebral infarction, unspecified: Secondary | ICD-10-CM

## 2019-10-18 HISTORY — DX: Cerebral infarction, unspecified: I63.9

## 2019-10-21 ENCOUNTER — Other Ambulatory Visit: Payer: Self-pay

## 2019-10-21 ENCOUNTER — Encounter: Payer: Self-pay | Admitting: Internal Medicine

## 2019-10-21 ENCOUNTER — Ambulatory Visit (INDEPENDENT_AMBULATORY_CARE_PROVIDER_SITE_OTHER): Payer: BC Managed Care – PPO | Admitting: Internal Medicine

## 2019-10-21 VITALS — BP 142/80 | HR 104 | Ht 66.5 in | Wt 209.0 lb

## 2019-10-21 DIAGNOSIS — N3001 Acute cystitis with hematuria: Secondary | ICD-10-CM | POA: Diagnosis not present

## 2019-10-21 DIAGNOSIS — F3341 Major depressive disorder, recurrent, in partial remission: Secondary | ICD-10-CM | POA: Diagnosis not present

## 2019-10-21 LAB — POC URINALYSIS WITH MICROSCOPIC (NON AUTO)MANUAL RESULT
Bilirubin, UA: NEGATIVE
Crystals: 0
Epithelial cells, urine per micros: 0
Glucose, UA: NEGATIVE
Ketones, UA: NEGATIVE
Leukocytes, UA: NEGATIVE
Mucus, UA: 0
Nitrite, UA: NEGATIVE
Protein, UA: NEGATIVE
RBC: 2 M/uL — AB (ref 4.04–5.48)
Spec Grav, UA: 1.015 (ref 1.010–1.025)
Urobilinogen, UA: 0.2 E.U./dL
WBC Casts, UA: 50
pH, UA: 6.5 (ref 5.0–8.0)

## 2019-10-21 MED ORDER — CEFUROXIME AXETIL 250 MG PO TABS: 250.0000 mg | ORAL_TABLET | Freq: Two times a day (BID) | ORAL | 0 refills | Status: AC

## 2019-10-21 NOTE — Progress Notes (Signed)
Date:  10/21/2019   Name:  Stacie [name]   DOB:  [birth year]   MRN:  [id number]   Chief Complaint: Hematuria (Mild cramping and blood in urine. Blood just started last night. )  Urinary Tract Infection  This is a new problem. The current episode started yesterday. The problem has been unchanged. The patient is experiencing no pain (but blood in urine last night). There has been no fever. Associated symptoms include hematuria. Pertinent negatives include no chills or urgency. She has tried nothing for the symptoms.  Depression        This is a chronic problem.  The problem occurs daily.The problem is unchanged.  Associated symptoms include no headaches.  Past treatments include SSRIs - Selective serotonin reuptake inhibitors (lexapro 20 mg a day).  Compliance with treatment is good.  Previous treatment provided significant relief.   Lab Results  Component Value Date   CREATININE 1.10 (H) 05/27/2019   BUN 21 05/27/2019   NA 144 05/27/2019   K 3.6 05/27/2019   CL 101 05/27/2019   CO2 28 05/27/2019   Lab Results  Component Value Date   CHOL 215 (H) 05/27/2019   HDL 59 05/27/2019   LDLCALC 125 (H) 05/27/2019   TRIG 155 (H) 05/27/2019   CHOLHDL 3.6 05/27/2019   Lab Results  Component Value Date   TSH 0.120 (L) 05/27/2019   Lab Results  Component Value Date   HGBA1C 6.0 (H) 04/20/2017     Review of Systems  Constitutional: Negative for chills and fever.  Respiratory: Positive for chest tightness, shortness of breath and wheezing.   Cardiovascular: Negative for chest pain and leg swelling.  Gastrointestinal: Negative for constipation and diarrhea.  Genitourinary: Positive for hematuria. Negative for dysuria and urgency.  Neurological: Negative for dizziness and headaches.  Psychiatric/Behavioral: Positive for depression. Negative for dysphoric mood and sleep disturbance. The patient is not nervous/anxious.     Patient Active Problem List   Diagnosis Date Noted  .  Pulmonary hypertension ([place]) 12/18/2018  . Depression, major, recurrent, in partial remission ([place]) 10/22/2018  . Mood disorder ([place]) 06/05/2018  . Compression fx, thoracic spine ([place]) 12/04/2017  . Morbid obesity due to excess calories ([place]) 05/08/2017  . Hyperlipidemia, mixed 04/21/2017  . Elevated blood pressure reading 04/20/2017  . Cough variant asthma  vs UACS  02/20/2017  . Hypothyroidism, acquired 03/27/2015  . Essential hypertension 03/27/2015  . H/O abnormal cervical Papanicolaou smear 03/27/2015  . H/O renal calculi 03/27/2015  . Allergic rhinitis 01/07/2015  . Obstructive sleep apnea 09/29/2014  . Other malaise and fatigue 06/03/2014  . Asthma, chronic 02/24/2014  . Cough 04/30/2013  . Pulmonary sarcoidosis ([place]) 03/12/2013  . CKD (chronic kidney disease) stage 3, GFR 30-59 ml/min ([facility]) 03/12/2013  . Calcium blood increased 05/23/2012  . Besnier-Boeck disease 02/08/2011    Allergies  Allergen Reactions  . Corn-Containing Products Diarrhea    headaches  . Nitrofurantoin Nausea Only  . Tramadol     Nausea and vomiting  . Sulfa Antibiotics Rash    As an infant    Past Surgical History:  Procedure Laterality Date  . CARDIAC CATHETERIZATION  10/18/2018   [facility]  . CATARACT EXTRACTION W/PHACO Left 07/31/2019   Procedure: CATARACT EXTRACTION PHACO AND INTRAOCULAR LENS PLACEMENT (IOC) LEFT 00:51.1  17.9%  9.15;  Surgeon: Leandrew Koyanagi, MD;  Location: [facility];  Service: Ophthalmology;  Laterality: Left;  keep this patient second  . COLON SURGERY     "colon  was fused to bladder - operated on both"  . COLONOSCOPY  09/18/2007   diverticuli, no polyps  . COLONOSCOPY  05/26/2010   diverticuli, no polyps  . PARS PLANA VITRECTOMY Right 05/20/2015   Procedure: PARS PLANA VITRECTOMY WITH 25 GAUGE, laser;  Surgeon: Milus Height, MD;  Location: [facility];  Service: Ophthalmology;  Laterality: Right;  . PARTIAL HYSTERECTOMY  1990  . TUBAL LIGATION       Social History   Tobacco Use  . Smoking status: Never Smoker  . Smokeless tobacco: Never Used  Substance Use Topics  . Alcohol use: No  . Drug use: No     Medication list has been reviewed and updated.  Current Meds  Medication Sig  . Abaloparatide (TYMLOS) 3120 MCG/1.56ML SOPN Inject into the skin.  . Acetaminophen-Codeine (TYLENOL/CODEINE #3) 300-30 MG tablet Take 1 tablet by mouth every 4 (four) hours as needed for pain.  Marland Kitchen albuterol (VENTOLIN HFA) 108 (90 Base) MCG/ACT inhaler Inhale 2 puffs into the lungs every 6 (six) hours as needed for wheezing.  Marland Kitchen amLODipine (NORVASC) 5 MG tablet Take 1 tablet (5 mg total) by mouth daily.  Marland Kitchen azithromycin (ZITHROMAX) 250 MG tablet Take 250 mg by mouth daily.  . brimonidine (ALPHAGAN) 0.2 % ophthalmic solution 3 (three) times daily.  . brinzolamide (AZOPT) 1 % ophthalmic suspension 1 drop 3 (three) times daily.  . budesonide-formoterol (SYMBICORT) 80-4.5 MCG/ACT inhaler Take 2 puffs first thing in am and then another 2 puffs about 12 hours later.  . folic acid (FOLVITE) 1 MG tablet Take 1 mg by mouth daily.  . mometasone (NASONEX) 50 MCG/ACT nasal spray Place 1 spray into the nose 2 (two) times daily.  . Multiple Vitamin (MULTIVITAMIN) capsule Take 1 capsule by mouth 2 (two) times daily.   . NON FORMULARY CPAP nightly  . OXYGEN Inhale 2 L into the lungs as needed.  . predniSONE (DELTASONE) 2.5 MG tablet Take 3 tablets by mouth daily at 2 PM.  . Respiratory Therapy Supplies (FLUTTER) DEVI Use as directed  . SYNTHROID 137 MCG tablet TAKE 1 TABLET (137 MCG TOTAL) BY MOUTH DAILY BEFORE BREAKFAST.  Marland Kitchen TREXALL 5 MG tablet Take 5 tablets by mouth every 7 (seven) days.    PHQ 2/9 Scores 10/21/2019 05/27/2019 04/26/2019 10/22/2018  PHQ - 2 Score 0 0 6 0  PHQ- 9 Score 0 0 16 -    BP Readings from Last 3 Encounters:  10/21/19 (!) 142/80  07/31/19 134/81  05/27/19 140/80    Physical Exam Vitals and nursing note reviewed.  Constitutional:       Appearance: She is well-developed.  Cardiovascular:     Rate and Rhythm: Normal rate and regular rhythm.     Heart sounds: Normal heart sounds.  Pulmonary:     Effort: Pulmonary effort is normal. No respiratory distress.     Breath sounds: Wheezing present.  Abdominal:     General: Bowel sounds are normal.     Palpations: Abdomen is soft.     Tenderness: There is no abdominal tenderness. There is no guarding or rebound.  Skin:    General: Skin is warm and dry.  Neurological:     General: No focal deficit present.     Mental Status: She is alert and oriented to person, place, and time.     Wt Readings from Last 3 Encounters:  10/21/19 209 lb (94.8 kg)  07/31/19 197 lb (89.4 kg)  05/27/19 189 lb (85.7 kg)    BP Marland Kitchen)  142/80   Pulse (!) 104   Ht 5' 6.5" (1.689 m)   Wt 209 lb (94.8 kg) Comment: weighed with oxygen bag today  SpO2 95%   BMI 33.23 kg/m   Assessment and Plan: 1. Acute cystitis with hematuria Early s/s of infection - will begin empiric therapy If sx persist - pt will need a urine culture - POC urinalysis w microscopic (non auto) - cefUROXime (CEFTIN) 250 MG tablet; Take 1 tablet (250 mg total) by mouth 2 (two) times daily with a meal for 5 days.  Dispense: 10 tablet; Refill: 0  2. Depression, major, recurrent, in partial remission ([place]) Clinically stable and doing well on Lexapro 20 mg No SI/HI She can adjust the dose slightly if she desired to 15 mg per day (she has an Rx for 10 mg tablets as well)   Partially dictated using Editor, commissioning. Any errors are unintentional.  Halina Maidens, MD [facility]  10/21/2019

## 2019-10-23 DIAGNOSIS — Z9981 Dependence on supplemental oxygen: Secondary | ICD-10-CM | POA: Diagnosis not present

## 2019-10-23 DIAGNOSIS — R0689 Other abnormalities of breathing: Secondary | ICD-10-CM | POA: Diagnosis not present

## 2019-10-23 DIAGNOSIS — I270 Primary pulmonary hypertension: Secondary | ICD-10-CM | POA: Diagnosis not present

## 2019-10-23 DIAGNOSIS — D869 Sarcoidosis, unspecified: Secondary | ICD-10-CM | POA: Diagnosis not present

## 2019-10-23 DIAGNOSIS — R0602 Shortness of breath: Secondary | ICD-10-CM | POA: Diagnosis not present

## 2019-10-23 DIAGNOSIS — J9611 Chronic respiratory failure with hypoxia: Secondary | ICD-10-CM | POA: Diagnosis not present

## 2019-10-23 DIAGNOSIS — G4733 Obstructive sleep apnea (adult) (pediatric): Secondary | ICD-10-CM | POA: Diagnosis not present

## 2019-10-23 DIAGNOSIS — R0600 Dyspnea, unspecified: Secondary | ICD-10-CM | POA: Diagnosis not present

## 2019-10-23 DIAGNOSIS — I272 Pulmonary hypertension, unspecified: Secondary | ICD-10-CM | POA: Diagnosis not present

## 2019-10-23 DIAGNOSIS — I071 Rheumatic tricuspid insufficiency: Secondary | ICD-10-CM | POA: Diagnosis not present

## 2019-10-25 DIAGNOSIS — G4733 Obstructive sleep apnea (adult) (pediatric): Secondary | ICD-10-CM | POA: Diagnosis not present

## 2019-11-04 ENCOUNTER — Other Ambulatory Visit: Payer: Self-pay

## 2019-11-04 ENCOUNTER — Ambulatory Visit (INDEPENDENT_AMBULATORY_CARE_PROVIDER_SITE_OTHER): Payer: BC Managed Care – PPO

## 2019-11-04 DIAGNOSIS — Z23 Encounter for immunization: Secondary | ICD-10-CM

## 2019-11-11 DIAGNOSIS — I272 Pulmonary hypertension, unspecified: Secondary | ICD-10-CM | POA: Diagnosis not present

## 2019-11-11 DIAGNOSIS — D860 Sarcoidosis of lung: Secondary | ICD-10-CM | POA: Diagnosis not present

## 2019-11-11 DIAGNOSIS — R0602 Shortness of breath: Secondary | ICD-10-CM | POA: Diagnosis not present

## 2019-11-11 DIAGNOSIS — G4733 Obstructive sleep apnea (adult) (pediatric): Secondary | ICD-10-CM | POA: Diagnosis not present

## 2019-11-11 DIAGNOSIS — R918 Other nonspecific abnormal finding of lung field: Secondary | ICD-10-CM | POA: Diagnosis not present

## 2019-11-11 DIAGNOSIS — J449 Chronic obstructive pulmonary disease, unspecified: Secondary | ICD-10-CM | POA: Diagnosis not present

## 2019-11-11 DIAGNOSIS — J9611 Chronic respiratory failure with hypoxia: Secondary | ICD-10-CM | POA: Diagnosis not present

## 2019-11-15 DIAGNOSIS — M359 Systemic involvement of connective tissue, unspecified: Secondary | ICD-10-CM | POA: Diagnosis not present

## 2019-11-15 DIAGNOSIS — I2721 Secondary pulmonary arterial hypertension: Secondary | ICD-10-CM | POA: Diagnosis not present

## 2019-11-25 DIAGNOSIS — G4733 Obstructive sleep apnea (adult) (pediatric): Secondary | ICD-10-CM | POA: Diagnosis not present

## 2019-12-13 DIAGNOSIS — G4733 Obstructive sleep apnea (adult) (pediatric): Secondary | ICD-10-CM | POA: Diagnosis not present

## 2019-12-13 DIAGNOSIS — I2721 Secondary pulmonary arterial hypertension: Secondary | ICD-10-CM | POA: Diagnosis not present

## 2019-12-13 DIAGNOSIS — M359 Systemic involvement of connective tissue, unspecified: Secondary | ICD-10-CM | POA: Diagnosis not present

## 2019-12-14 DIAGNOSIS — Z23 Encounter for immunization: Secondary | ICD-10-CM | POA: Diagnosis not present

## 2019-12-23 DIAGNOSIS — G4733 Obstructive sleep apnea (adult) (pediatric): Secondary | ICD-10-CM | POA: Diagnosis not present

## 2020-01-07 ENCOUNTER — Other Ambulatory Visit: Payer: Self-pay | Admitting: Internal Medicine

## 2020-01-07 ENCOUNTER — Other Ambulatory Visit: Payer: Self-pay

## 2020-01-07 ENCOUNTER — Ambulatory Visit (INDEPENDENT_AMBULATORY_CARE_PROVIDER_SITE_OTHER): Payer: BC Managed Care – PPO | Admitting: Internal Medicine

## 2020-01-07 ENCOUNTER — Encounter: Payer: Self-pay | Admitting: Internal Medicine

## 2020-01-07 VITALS — BP 130/58 | HR 111 | Temp 97.5°F | Ht 66.5 in | Wt 209.0 lb

## 2020-01-07 DIAGNOSIS — N3000 Acute cystitis without hematuria: Secondary | ICD-10-CM | POA: Diagnosis not present

## 2020-01-07 DIAGNOSIS — I10 Essential (primary) hypertension: Secondary | ICD-10-CM

## 2020-01-07 LAB — POC URINALYSIS WITH MICROSCOPIC (NON AUTO)MANUAL RESULT
Bilirubin, UA: NEGATIVE
Blood, UA: NEGATIVE
Crystals: 0
Epithelial cells, urine per micros: 0
Glucose, UA: NEGATIVE
Ketones, UA: NEGATIVE
Mucus, UA: 0
Nitrite, UA: NEGATIVE
Protein, UA: NEGATIVE
RBC: 0 M/uL — AB (ref 4.04–5.48)
Spec Grav, UA: <=1.005 — AB (ref 1.010–1.025)
Urobilinogen, UA: 0.2 E.U./dL
WBC Casts, UA: 1
pH, UA: 6 (ref 5.0–8.0)

## 2020-01-07 NOTE — Progress Notes (Signed)
Date:  01/07/2020   Name:  Stacie [name]   DOB:  30-Nov-1955   MRN:  [id number]   Chief Complaint: Urinary Tract Infection (Urgency. No painful, but feels aware when she has to go. Fever on and off.  Happend last visit and got better after meds but now its back. )  Urinary Tract Infection  This is a new problem. The current episode started in the past 7 days. The patient is experiencing no pain. There has been no fever. Associated symptoms include frequency and urgency. Pertinent negatives include no chills, flank pain or hematuria. She has tried nothing for the symptoms.  She was treated for infection about 2 months ago.  Sx resolved for about a month then several weeks ago started up again.  Mostly urgency without much dysuria.  No bleeding, or other worrisome sx.  Lab Results  Component Value Date   CREATININE 1.10 (H) 05/27/2019   BUN 21 05/27/2019   NA 144 05/27/2019   K 3.6 05/27/2019   CL 101 05/27/2019   CO2 28 05/27/2019   Lab Results  Component Value Date   CHOL 215 (H) 05/27/2019   HDL 59 05/27/2019   LDLCALC 125 (H) 05/27/2019   TRIG 155 (H) 05/27/2019   CHOLHDL 3.6 05/27/2019   Lab Results  Component Value Date   TSH 0.120 (L) 05/27/2019   Lab Results  Component Value Date   HGBA1C 6.0 (H) 04/20/2017   Lab Results  Component Value Date   WBC 10.4 05/27/2019   HGB 13.4 05/27/2019   HCT 39.2 05/27/2019   MCV 91 05/27/2019   PLT 299 05/27/2019   Lab Results  Component Value Date   ALT 16 05/27/2019   AST 23 05/27/2019   ALKPHOS 71 05/27/2019   BILITOT 0.3 05/27/2019     Review of Systems  Constitutional: Negative for chills, diaphoresis and fever.  HENT: Negative for trouble swallowing.   Respiratory: Positive for shortness of breath. Negative for choking and chest tightness.   Cardiovascular: Negative for chest pain and leg swelling.  Genitourinary: Positive for frequency and urgency. Negative for flank pain and hematuria.  Neurological:  Negative for dizziness, light-headedness and headaches.    Patient Active Problem List   Diagnosis Date Noted  . Pulmonary hypertension ([place]) 12/18/2018  . Depression, major, recurrent, in partial remission ([place]) 10/22/2018  . Mood disorder ([place]) 06/05/2018  . Compression fx, thoracic spine ([place]) 12/04/2017  . Morbid obesity due to excess calories ([place]) 05/08/2017  . Hyperlipidemia, mixed 04/21/2017  . Elevated blood pressure reading 04/20/2017  . Cough variant asthma  vs UACS  02/20/2017  . Hypothyroidism, acquired 03/27/2015  . Essential hypertension 03/27/2015  . H/O abnormal cervical Papanicolaou smear 03/27/2015  . H/O renal calculi 03/27/2015  . Allergic rhinitis 01/07/2015  . Obstructive sleep apnea 09/29/2014  . Other malaise and fatigue 06/03/2014  . Asthma, chronic 02/24/2014  . Cough 04/30/2013  . Pulmonary sarcoidosis ([place]) 03/12/2013  . CKD (chronic kidney disease) stage 3, GFR 30-59 ml/min ([facility]) 03/12/2013  . Calcium blood increased 05/23/2012  . Besnier-Boeck disease 02/08/2011    Allergies  Allergen Reactions  . Corn-Containing Products Diarrhea    headaches  . Nitrofurantoin Nausea Only  . Tramadol     Nausea and vomiting  . Sulfa Antibiotics Rash    As an infant    Past Surgical History:  Procedure Laterality Date  . CARDIAC CATHETERIZATION  10/18/2018   [facility]  . CATARACT EXTRACTION W/PHACO Left 07/31/2019  Procedure: CATARACT EXTRACTION PHACO AND INTRAOCULAR LENS PLACEMENT (IOC) LEFT 00:51.1  17.9%  9.15;  Surgeon: Leandrew Koyanagi, MD;  Location: [facility];  Service: Ophthalmology;  Laterality: Left;  keep this patient second  . COLON SURGERY     "colon was fused to bladder - operated on both"  . COLONOSCOPY  09/18/2007   diverticuli, no polyps  . COLONOSCOPY  05/26/2010   diverticuli, no polyps  . PARS PLANA VITRECTOMY Right 05/20/2015   Procedure: PARS PLANA VITRECTOMY WITH 25 GAUGE, laser;  Surgeon: Milus Height, MD;   Location: [facility];  Service: Ophthalmology;  Laterality: Right;  . PARTIAL HYSTERECTOMY  1990  . TUBAL LIGATION      Social History   Tobacco Use  . Smoking status: Never Smoker  . Smokeless tobacco: Never Used  Substance Use Topics  . Alcohol use: No  . Drug use: No     Medication list has been reviewed and updated.  Current Meds  Medication Sig  . Abaloparatide (TYMLOS) 3120 MCG/1.56ML SOPN Inject into the skin.  . Acetaminophen-Codeine (TYLENOL/CODEINE #3) 300-30 MG tablet Take 1 tablet by mouth every 4 (four) hours as needed for pain.  Marland Kitchen albuterol (VENTOLIN HFA) 108 (90 Base) MCG/ACT inhaler Inhale 2 puffs into the lungs every 6 (six) hours as needed for wheezing.  Marland Kitchen amLODipine (NORVASC) 5 MG tablet TAKE 1 TABLET BY MOUTH EVERY DAY  . brimonidine (ALPHAGAN) 0.2 % ophthalmic solution 3 (three) times daily.  . brinzolamide (AZOPT) 1 % ophthalmic suspension 1 drop 3 (three) times daily.  . budesonide-formoterol (SYMBICORT) 80-4.5 MCG/ACT inhaler Take 2 puffs first thing in am and then another 2 puffs about 12 hours later.  . escitalopram (LEXAPRO) 20 MG tablet Take 1 tablet (20 mg total) by mouth daily.  . folic acid (FOLVITE) 1 MG tablet Take 1 mg by mouth daily.  Marland Kitchen latanoprost (XALATAN) 0.005 % ophthalmic solution Place 1 drop into the right eye at bedtime.  . mometasone (NASONEX) 50 MCG/ACT nasal spray Place 1 spray into the nose 2 (two) times daily.  . Multiple Vitamin (MULTIVITAMIN) capsule Take 1 capsule by mouth 2 (two) times daily.   . NON FORMULARY CPAP nightly  . OXYGEN Inhale 2 L into the lungs as needed.  . predniSONE (DELTASONE) 2.5 MG tablet Take 3 tablets by mouth daily at 2 PM.  . Respiratory Therapy Supplies (FLUTTER) DEVI Use as directed  . SYNTHROID 137 MCG tablet TAKE 1 TABLET (137 MCG TOTAL) BY MOUTH DAILY BEFORE BREAKFAST.  Marland Kitchen TREXALL 5 MG tablet Take 5 tablets by mouth every 7 (seven) days.  Marland Kitchen UNABLE TO FIND Med Name: Lequita Asal    [facility] 2/9 Scores 01/07/2020  10/21/2019 05/27/2019 04/26/2019  PHQ - 2 Score 0 0 0 6  PHQ- 9 Score 0 0 0 16    BP Readings from Last 3 Encounters:  01/07/20 (!) 130/58  10/21/19 (!) 142/80  07/31/19 134/81    Physical Exam Vitals and nursing note reviewed.  Constitutional:      Appearance: She is well-developed.  Cardiovascular:     Rate and Rhythm: Normal rate and regular rhythm.     Heart sounds: Normal heart sounds.  Pulmonary:     Effort: Pulmonary effort is normal. No respiratory distress.     Breath sounds: Normal breath sounds.  Abdominal:     General: Bowel sounds are normal.     Palpations: Abdomen is soft.     Tenderness: There is no abdominal tenderness. There is no  guarding or rebound.  Skin:    General: Skin is warm and dry.     Wt Readings from Last 3 Encounters:  01/07/20 209 lb (94.8 kg)  10/21/19 209 lb (94.8 kg)  07/31/19 197 lb (89.4 kg)    BP (!) 130/58   Pulse (!) 111   Temp (!) 97.5 F (36.4 C) (Temporal)   Ht 5' 6.5" (1.689 m)   Wt 209 lb (94.8 kg)   SpO2 (!) 88%   BMI 33.23 kg/m   Assessment and Plan: 1. Acute cystitis without hematuria Urine shows only a few bacteria and one WBC; no RBCs May not be a true infection but the sx have been persistent Continue sufficient water intake daily Consider Urology referral Wait for Cx before prescribing antibiotics - Urine Culture   Partially dictated using [place]. Any errors are unintentional.  Halina Maidens, MD [facility]  01/07/2020

## 2020-01-10 ENCOUNTER — Other Ambulatory Visit: Payer: Self-pay

## 2020-01-10 ENCOUNTER — Other Ambulatory Visit: Payer: Self-pay | Admitting: Internal Medicine

## 2020-01-10 DIAGNOSIS — G4733 Obstructive sleep apnea (adult) (pediatric): Secondary | ICD-10-CM | POA: Diagnosis not present

## 2020-01-10 DIAGNOSIS — M359 Systemic involvement of connective tissue, unspecified: Secondary | ICD-10-CM | POA: Diagnosis not present

## 2020-01-10 DIAGNOSIS — I2721 Secondary pulmonary arterial hypertension: Secondary | ICD-10-CM | POA: Diagnosis not present

## 2020-01-10 DIAGNOSIS — N3090 Cystitis, unspecified without hematuria: Secondary | ICD-10-CM

## 2020-01-10 LAB — URINE CULTURE

## 2020-01-10 MED ORDER — CIPROFLOXACIN HCL 500 MG PO TABS: 500.0000 mg | ORAL_TABLET | Freq: Two times a day (BID) | ORAL | 0 refills | Status: AC

## 2020-01-11 DIAGNOSIS — Z23 Encounter for immunization: Secondary | ICD-10-CM | POA: Diagnosis not present

## 2020-01-23 DIAGNOSIS — G4733 Obstructive sleep apnea (adult) (pediatric): Secondary | ICD-10-CM | POA: Diagnosis not present

## 2020-01-27 DIAGNOSIS — M810 Age-related osteoporosis without current pathological fracture: Secondary | ICD-10-CM | POA: Diagnosis not present

## 2020-01-30 ENCOUNTER — Other Ambulatory Visit: Payer: Self-pay

## 2020-01-30 DIAGNOSIS — N390 Urinary tract infection, site not specified: Secondary | ICD-10-CM

## 2020-01-31 ENCOUNTER — Other Ambulatory Visit: Payer: Self-pay

## 2020-01-31 ENCOUNTER — Encounter: Payer: Self-pay | Admitting: Urology

## 2020-01-31 ENCOUNTER — Ambulatory Visit (INDEPENDENT_AMBULATORY_CARE_PROVIDER_SITE_OTHER): Payer: BC Managed Care – PPO | Admitting: Urology

## 2020-01-31 ENCOUNTER — Other Ambulatory Visit
Admission: RE | Admit: 2020-01-31 | Discharge: 2020-01-31 | Disposition: A | Discharge status: Home / Self Care | Payer: BC Managed Care – PPO | Attending: Urology | Admitting: Urology

## 2020-01-31 VITALS — BP 153/95 | HR 87 | Ht 67.0 in | Wt 191.0 lb

## 2020-01-31 DIAGNOSIS — N390 Urinary tract infection, site not specified: Secondary | ICD-10-CM

## 2020-01-31 DIAGNOSIS — R3129 Other microscopic hematuria: Secondary | ICD-10-CM

## 2020-01-31 LAB — URINALYSIS, COMPLETE (UACMP) WITH MICROSCOPIC
Bacteria, UA: NONE SEEN
Bilirubin Urine: NEGATIVE
Glucose, UA: NEGATIVE mg/dL
Ketones, ur: NEGATIVE mg/dL
Nitrite: NEGATIVE
Protein, ur: NEGATIVE mg/dL
Specific Gravity, Urine: 1.015 (ref 1.005–1.030)
Squamous Epithelial / HPF: NONE SEEN (ref 0–5)
pH: 5.5 (ref 5.0–8.0)

## 2020-01-31 LAB — BLADDER SCAN AMB NON-IMAGING

## 2020-01-31 NOTE — Progress Notes (Signed)
01/31/20 9:40 AM   Stacie [name] 02/14/1956 [id number]  Referring provider: Glean Hess, MD [address] [place],  [place] [address]  Chief Complaint  Patient presents with  . Recurrent UTI    New Patient    HPI: Stacie [name] is a 64 y.o. F who presents for the evaluation and management of recurrent cystitis.   Positive dip in Jan with no associated culture. She had a urine in August which was unremarkable. UTI x 2 within past year.   Most recent RUS from 2012 indicative of possible BL kidney stones.   She had a UTI infection as shown below on 01/07/20. She was treated with Cipro 500 mg BID x 1 week. Relieved of symptoms after finishing antibiotics however symptoms return 24 hours after.   She reports of urgency and frequency onset 2-3 months. She drinks water mostly w/ flavored powder. She states of leaking when she coughs sometimes.   She had one dose of Lasix, not taking chronically (prn edema).   Denies constipation or irregular bowel movement. No abdominal pain.   SHx of partial hysterectomy (ovaries remain) in 1990. Past hx of kidney stones 20 years ago. No recent flank  pain. She notes of mild smoking hx while in college.  PVR is 12 mL.   +Urine culture 01/07/20 indicative of Raoultella planticola resistant to Ampicillin   PMH: Past Medical History:  Diagnosis Date  . Arrhythmia    patient unaware if this is current  . Asthma   . HOH (hard of hearing)    wear aids  . Hypothyroidism   . IBS (irritable bowel syndrome)   . Pulmonary hypertension ([place])   . Sarcoid   . Sarcoidosis   . Seasonal allergies   . Sleep apnea CPAP with O2  . Wears hearing aid in both ears     Surgical History: Past Surgical History:  Procedure Laterality Date  . CARDIAC CATHETERIZATION  10/18/2018   [facility]  . CATARACT EXTRACTION W/PHACO Left 07/31/2019   Procedure: CATARACT EXTRACTION PHACO AND INTRAOCULAR LENS PLACEMENT (IOC) LEFT 00:51.1  17.9%  9.15;   Surgeon: Leandrew Koyanagi, MD;  Location: [facility];  Service: Ophthalmology;  Laterality: Left;  keep this patient second  . COLON SURGERY     "colon was fused to bladder - operated on both"  . COLONOSCOPY  09/18/2007   diverticuli, no polyps  . COLONOSCOPY  05/26/2010   diverticuli, no polyps  . PARS PLANA VITRECTOMY Right 05/20/2015   Procedure: PARS PLANA VITRECTOMY WITH 25 GAUGE, laser;  Surgeon: Milus Height, MD;  Location: [facility];  Service: Ophthalmology;  Laterality: Right;  . PARTIAL HYSTERECTOMY  1990  . TUBAL LIGATION      Home Medications:  Allergies as of 01/31/2020      Reactions   Corn-containing Products Diarrhea   headaches   Nitrofurantoin Nausea Only   Tramadol    Nausea and vomiting   Sulfa Antibiotics Rash   As an infant      Medication List       Accurate as of January 31, 2020 11:59 PM. If you have any questions, ask your nurse or doctor.        Acetaminophen-Codeine 300-30 MG tablet Commonly known as: TYLENOL/CODEINE #3 Take 1 tablet by mouth every 4 (four) hours as needed for pain.   albuterol 108 (90 Base) MCG/ACT inhaler Commonly known as: Ventolin HFA Inhale 2 puffs into the lungs every 6 (six) hours as needed  for wheezing.   amLODipine 5 MG tablet Commonly known as: NORVASC TAKE 1 TABLET BY MOUTH EVERY DAY   brimonidine 0.2 % ophthalmic solution Commonly known as: ALPHAGAN 3 (three) times daily.   brinzolamide 1 % ophthalmic suspension Commonly known as: AZOPT 1 drop 3 (three) times daily.   budesonide-formoterol 80-4.5 MCG/ACT inhaler Commonly known as: Symbicort Take 2 puffs first thing in am and then another 2 puffs about 12 hours later.   escitalopram 20 MG tablet Commonly known as: LEXAPRO Take 1 tablet (20 mg total) by mouth daily.   Flutter Devi Use as directed   folic acid 1 MG tablet Commonly known as: FOLVITE Take 1 mg by mouth daily.   furosemide 20 MG tablet Commonly known as: LASIX Take 20 mg  by mouth 2 (two) times daily.   latanoprost 0.005 % ophthalmic solution Commonly known as: XALATAN Place 1 drop into the right eye at bedtime.   mometasone 50 MCG/ACT nasal spray Commonly known as: NASONEX Place 1 spray into the nose 2 (two) times daily.   multivitamin capsule Take 1 capsule by mouth 2 (two) times daily.   NON FORMULARY CPAP nightly   OXYGEN Inhale 2 L into the lungs as needed.   predniSONE 2.5 MG tablet Commonly known as: DELTASONE Take 3 tablets by mouth daily at 2 PM.   Synthroid 137 MCG tablet Generic drug: levothyroxine TAKE 1 TABLET (137 MCG TOTAL) BY MOUTH DAILY BEFORE BREAKFAST.   Treprostinil 0.6 MG/ML Soln Commonly known as: TYVASO Inhale 18 mcg into the lungs 4 (four) times daily. 12 breaths 4 times a day   Trexall 5 MG tablet Generic drug: methotrexate Take 5 tablets by mouth every 7 (seven) days.   Tymlos 3120 MCG/1.56ML Sopn Generic drug: Abaloparatide Inject into the skin.       Allergies:  Allergies  Allergen Reactions  . Corn-Containing Products Diarrhea    headaches  . Nitrofurantoin Nausea Only  . Tramadol     Nausea and vomiting  . Sulfa Antibiotics Rash    As an infant    Family History: Family History  Problem Relation Age of Onset  . Allergies Father   . Asthma Father   . Colon cancer Father   . Allergies Brother   . Asthma Brother   . Breast cancer Maternal Grandmother     Social History:  reports that she has never smoked. She has never used smokeless tobacco. She reports that she does not drink alcohol or use drugs.   Physical Exam: BP (!) 153/95   Pulse 87   Ht _0  (1.702 m)   Wt 191 lb (86.6 kg)   BMI 29.91 kg/m   Constitutional:  Alert and oriented, No acute distress. HEENT: [place] AT, moist mucus membranes.  Trachea midline, no masses. Cardiovascular: No clubbing, cyanosis, or edema. Respiratory: Normal respiratory effort, no increased work of breathing. Skin: No rashes, bruises or suspicious  lesions. Neurologic: Grossly intact, no focal deficits, moving all 4 extremities. Psychiatric: Normal mood and affect.  Laboratory Data:  Urinalysis UA today indicative of microscopic blood.   Pertinent Imaging: Results for orders placed or performed in visit on 01/31/20  BLADDER SCAN AMB NON-IMAGING  Result Value Ref Range   Scan Result 20m     Assessment & Plan:    1. Microscopic hematuria  We discussed the differential diagnosis for microscopic hematuria including nephrolithiasis, renal or upper tract tumors, bladder stones, UTIs, or bladder tumors as well as undetermined etiologies. Per AUA  guidelines, I did recommend complete microscopic hematuria evaluation including CTU, possible urine cytology, and office cystoscopy. Return for cysto/pelvic w/ CT prior   2. Urgency/frequency  Adequate emptying of bladder  Cysto to rule out abnormal pathology  Will consider pharmacological intervention at next visit   3. rUTIs  Pelvic exam at next visit to access estrogen   4. Stress incontinence  Long standing minimally bothersome Conservative management this time unless symptoms worsen    [facility] [address] [place],  [address] [phone]  I, Lucas Mallow, am acting as a scribe for Dr. Hollice Espy,  I have reviewed the above documentation for accuracy and completeness, and I agree with the above.   Hollice Espy, MD

## 2020-02-07 DIAGNOSIS — M359 Systemic involvement of connective tissue, unspecified: Secondary | ICD-10-CM | POA: Diagnosis not present

## 2020-02-07 DIAGNOSIS — I2721 Secondary pulmonary arterial hypertension: Secondary | ICD-10-CM | POA: Diagnosis not present

## 2020-02-10 ENCOUNTER — Other Ambulatory Visit: Payer: Self-pay

## 2020-02-10 DIAGNOSIS — G4733 Obstructive sleep apnea (adult) (pediatric): Secondary | ICD-10-CM | POA: Diagnosis not present

## 2020-02-10 DIAGNOSIS — R3129 Other microscopic hematuria: Secondary | ICD-10-CM

## 2020-02-10 DIAGNOSIS — N390 Urinary tract infection, site not specified: Secondary | ICD-10-CM

## 2020-02-11 ENCOUNTER — Other Ambulatory Visit: Payer: Self-pay

## 2020-02-11 ENCOUNTER — Other Ambulatory Visit
Admission: RE | Admit: 2020-02-11 | Discharge: 2020-02-11 | Disposition: A | Discharge status: Home / Self Care | Payer: BC Managed Care – PPO | Source: Ambulatory Visit | Attending: Urology | Admitting: Urology

## 2020-02-11 ENCOUNTER — Ambulatory Visit
Admission: RE | Admit: 2020-02-11 | Discharge: 2020-02-11 | Disposition: A | Discharge status: Home / Self Care | Payer: BC Managed Care – PPO | Source: Ambulatory Visit | Attending: Urology | Admitting: Urology

## 2020-02-11 DIAGNOSIS — R3129 Other microscopic hematuria: Secondary | ICD-10-CM

## 2020-02-11 DIAGNOSIS — N202 Calculus of kidney with calculus of ureter: Secondary | ICD-10-CM | POA: Diagnosis not present

## 2020-02-11 LAB — CREATININE, SERUM
Creatinine, Ser: 1.11 mg/dL — ABNORMAL HIGH (ref 0.44–1.00)
GFR calc Af Amer: >60 mL/min (ref >60)
GFR calc non Af Amer: 52 mL/min — ABNORMAL LOW (ref >60)

## 2020-02-11 MED ORDER — IOHEXOL 300 MG/ML  SOLN
150.0000 mL | Freq: Once | INTRAMUSCULAR | Status: AC | PRN
  Administered 2020-02-11: 125 mL via INTRAVENOUS

## 2020-02-12 NOTE — Progress Notes (Signed)
 02/13/20  CC:  Chief Complaint  Patient presents with  . Cysto    HPI: Stacie [name] is a 64 y.o. F w/ recurrent cystitis and microscopic hematuria returns today for follow-up CT urogram.  She was originally scheduled for cystoscopy however given findings, this was converted to a regular office visit.  Positive dip in Jan with no associated culture. She had a urine in August which was unremarkable. UTI x 2 within past year.   She had a UTI infection as shown below on 01/07/20. She was treated with Cipro 500 mg BID x 1 week. Relieved of symptoms after finishing antibiotics however symptoms return 24 hours after.   Her UA from 01/31/20 revealed 11-20 RBC.   Her CT from 02/11/20 revealed a 4 mm calculus in the distal third of the left ureter w/ no hydronephrosis. Additional small nonobstructive bilateral renal calculi noted.   No complaints today.   SHx of partial hysterectomy (ovaries remain) in 1990. Past hx of kidney stones 20 years ago. No recent flank  pain. She notes of mild smoking hx while in college.  +Urine culture 01/07/20 indicative of Raoultella planticola resistant to Ampicillin   PMH: Past Medical History:  Diagnosis Date  . Arrhythmia    patient unaware if this is current  . Asthma   . HOH (hard of hearing)    wear aids  . Hypothyroidism   . IBS (irritable bowel syndrome)   . Pulmonary hypertension ([facility])   . Sarcoid   . Sarcoidosis   . Seasonal allergies   . Sleep apnea CPAP with O2  . Wears hearing aid in both ears     Surgical History: Past Surgical History:  Procedure Laterality Date  . CARDIAC CATHETERIZATION  10/18/2018   [facility]  . CATARACT EXTRACTION W/PHACO Left 07/31/2019   Procedure: CATARACT EXTRACTION PHACO AND INTRAOCULAR LENS PLACEMENT (IOC) LEFT 00:51.1  17.9%  9.15;  Surgeon: Brasington, Chadwick, MD;  Location: [facility];  Service: Ophthalmology;  Laterality: Left;  keep this patient second  . COLON SURGERY     "colon was  fused to bladder - operated on both"  . COLONOSCOPY  09/18/2007   diverticuli, no polyps  . COLONOSCOPY  05/26/2010   diverticuli, no polyps  . PARS PLANA VITRECTOMY Right 05/20/2015   Procedure: PARS PLANA VITRECTOMY WITH 25 GAUGE, laser;  Surgeon: Jessica Guide Rock, MD;  Location: [facility];  Service: Ophthalmology;  Laterality: Right;  . PARTIAL HYSTERECTOMY  1990  . TUBAL LIGATION      Home Medications:  Allergies as of 02/13/2020      Reactions   Corn-containing Products Diarrhea   headaches   Nitrofurantoin Nausea Only   Tramadol    Nausea and vomiting   Sulfa Antibiotics Rash   As an infant      Medication List       Accurate as of February 13, 2020  9:23 AM. If you have any questions, ask your nurse or doctor.        STOP taking these medications   amLODipine 5 MG tablet Commonly known as: NORVASC Stopped by: Manjit Bufano, MD   latanoprost 0.005 % ophthalmic solution Commonly known as: XALATAN Stopped by: Jaclynne Baldo, MD   mometasone 50 MCG/ACT nasal spray Commonly known as: NASONEX Stopped by: Ravi Tuccillo, MD     TAKE these medications   Acetaminophen-Codeine 300-30 MG tablet Commonly known as: TYLENOL/CODEINE #3 Take 1 tablet by mouth every 4 (four) hours as needed for pain.     albuterol 108 (90 Base) MCG/ACT inhaler Commonly known as: Ventolin HFA Inhale 2 puffs into the lungs every 6 (six) hours as needed for wheezing.   brimonidine 0.2 % ophthalmic solution Commonly known as: ALPHAGAN 3 (three) times daily.   brinzolamide 1 % ophthalmic suspension Commonly known as: AZOPT 1 drop 3 (three) times daily.   budesonide-formoterol 80-4.5 MCG/ACT inhaler Commonly known as: Symbicort Take 2 puffs first thing in am and then another 2 puffs about 12 hours later.   escitalopram 20 MG tablet Commonly known as: LEXAPRO Take 1 tablet (20 mg total) by mouth daily.   Flutter Devi Use as directed   folic acid 1 MG tablet Commonly known as: FOLVITE  Take 1 mg by mouth daily.   furosemide 20 MG tablet Commonly known as: LASIX Take 20 mg by mouth 2 (two) times daily.   multivitamin capsule Take 1 capsule by mouth 2 (two) times daily.   NON FORMULARY CPAP nightly   OXYGEN Inhale 2 L into the lungs as needed.   predniSONE 2.5 MG tablet Commonly known as: DELTASONE Take 3 tablets by mouth daily at 2 PM.   Synthroid 137 MCG tablet Generic drug: levothyroxine TAKE 1 TABLET (137 MCG TOTAL) BY MOUTH DAILY BEFORE BREAKFAST.   Treprostinil 0.6 MG/ML Soln Commonly known as: TYVASO Inhale 18 mcg into the lungs 4 (four) times daily. 12 breaths 4 times a day   Trexall 5 MG tablet Generic drug: methotrexate Take 5 tablets by mouth every 7 (seven) days.   Tymlos 3120 MCG/1.56ML Sopn Generic drug: Abaloparatide Inject into the skin.       Allergies:  Allergies  Allergen Reactions  . Corn-Containing Products Diarrhea    headaches  . Nitrofurantoin Nausea Only  . Tramadol     Nausea and vomiting  . Sulfa Antibiotics Rash    As an infant    Family History: Family History  Problem Relation Age of Onset  . Allergies Father   . Asthma Father   . Colon cancer Father   . Allergies Brother   . Asthma Brother   . Breast cancer Maternal Grandmother     Social History:  reports that she has never smoked. She has never used smokeless tobacco. She reports that she does not drink alcohol or use drugs.   Physical Exam: BP (!) 149/78   Pulse (!) 105   Ht 5' 7" (1.702 m)   Wt 190 lb (86.2 kg)   BMI 29.76 kg/m   Constitutional:  Alert and oriented, No acute distress. HEENT: [place] AT, moist mucus membranes.  Trachea midline, no masses. Cardiovascular: No clubbing, cyanosis, or edema. Respiratory: Normal respiratory effort, no increased work of breathing. Skin: No rashes, bruises or suspicious lesions. Neurologic: Grossly intact, no focal deficits, moving all 4 extremities. Psychiatric: Normal mood and affect.  Laboratory  Data:  Lab Results  Component Value Date   CREATININE 1.11 (H) 02/11/2020    Lab Results  Component Value Date   HGBA1C 6.0 (H) 04/20/2017    Pertinent Imagings:    CLINICAL DATA:  Microscopic hematuria, urinary urgency  EXAM: CT ABDOMEN AND PELVIS WITHOUT AND WITH CONTRAST  TECHNIQUE: Multidetector CT imaging of the abdomen and pelvis was performed following the standard protocol before and following the bolus administration of intravenous contrast.  CONTRAST:  125mL OMNIPAQUE IOHEXOL 300 MG/ML  SOLN  COMPARISON:  12/18/2010  FINDINGS: Lower chest: Fibrosis and subpleural bronchiolectasis of the included bilateral lung bases, worsened compared to prior examination dated 12/18/2010.   Coronary artery calcifications.  Hepatobiliary: No solid liver abnormality is seen. No gallstones, gallbladder wall thickening, or biliary dilatation.  Pancreas: Unremarkable. No pancreatic ductal dilatation or surrounding inflammatory changes.  Spleen: Normal in size without significant abnormality.  Adrenals/Urinary Tract: Adrenal glands are unremarkable. Small nonobstructive bilateral renal calculi. There is a 4 mm calculus in the distal third of the left ureter (series 8, image 68). No hydronephrosis.  Bladder is unremarkable.  Stomach/Bowel: Stomach is within normal limits. Large incidental diverticula of the descending and ascending duodenum. No evidence of bowel wall thickening, distention, or inflammatory changes. Occasional sigmoid diverticula.  Vascular/Lymphatic: Aortic atherosclerosis. No enlarged abdominal or pelvic lymph nodes.  Reproductive: Status post hysterectomy.  Other: No abdominal wall hernia or abnormality. No abdominopelvic ascites.  Musculoskeletal: No acute or significant osseous findings.  IMPRESSION: 1. There is a 4 mm calculus in the distal third of the left ureter. No hydronephrosis.  2.  Additional small nonobstructive  bilateral renal calculi.  2. Fibrosis and subpleural bronchiolectasis of the included bilateral lung bases, worsened compared to prior examination dated 12/18/2010. Consider dedicated ILD protocol CT examination of the chest to further evaluate.  3.  Coronary artery disease.  Aortic Atherosclerosis (ICD10-I70.0).   Electronically Signed   By: Eddie Candle M.D.   On: 02/11/2020 15:36  I have personally reviewed the images and agree with radiologist interpretation.   Assessment/ Plan:  1. Microscopic hematuria  CT from 01/2720 revealed  revealed a 4 mm calculus in the distal third of the left ureter w/ no hydronephrosis. Additional small nonobstructive bilateral renal calculi noted.  Likely secondary to stone   Will complete work-up with cystoscopy at the time of stone treatment.  2. Ureterolithiasis  We discussed various treatment options including ESWL vs. ureteroscopy, laser lithotripsy, and stent. We discussed the risks and benefits of both including bleeding, infection, damage to surrounding structures, efficacy with need for possible further intervention, and need for temporary ureteral stent. Given that her irritative urinary symptoms began to 3 months ago, this is suggestive that the stone may be contributing factor.  Given that she is not passed this spontaneously in the interim, I recommended surgical intervention. Recommended ureteroscopy, laser lithotripsy, and stent w/ cystoscopy.  Pt understood and will proceed w/ plan   I, Lucas Mallow, am acting as a scribe for Dr. Hollice Espy,  I have reviewed the above documentation for accuracy and completeness, and I agree with the above.   Hollice Espy, MD

## 2020-02-12 NOTE — H&P (View-Only) (Signed)
02/13/20  CC:  Chief Complaint  Patient presents with  . Cysto    HPI: Stacie [name] is a 64 y.o. F w/ recurrent cystitis and microscopic hematuria returns today for follow-up CT urogram.  She was originally scheduled for cystoscopy however given findings, this was converted to a regular office visit.  Positive dip in Jan with no associated culture. She had a urine in August which was unremarkable. UTI x 2 within past year.   She had a UTI infection as shown below on 01/07/20. She was treated with Cipro 500 mg BID x 1 week. Relieved of symptoms after finishing antibiotics however symptoms return 24 hours after.   Her UA from 01/31/20 revealed 11-20 RBC.   Her CT from 02/11/20 revealed a 4 mm calculus in the distal third of the left ureter w/ no hydronephrosis. Additional small nonobstructive bilateral renal calculi noted.   No complaints today.   SHx of partial hysterectomy (ovaries remain) in 1990. Past hx of kidney stones 20 years ago. No recent flank  pain. She notes of mild smoking hx while in college.  +Urine culture 01/07/20 indicative of Raoultella planticola resistant to Ampicillin   PMH: Past Medical History:  Diagnosis Date  . Arrhythmia    patient unaware if this is current  . Asthma   . HOH (hard of hearing)    wear aids  . Hypothyroidism   . IBS (irritable bowel syndrome)   . Pulmonary hypertension ([place])   . Sarcoid   . Sarcoidosis   . Seasonal allergies   . Sleep apnea CPAP with O2  . Wears hearing aid in both ears     Surgical History: Past Surgical History:  Procedure Laterality Date  . CARDIAC CATHETERIZATION  10/18/2018   [facility]  . CATARACT EXTRACTION W/PHACO Left 07/31/2019   Procedure: CATARACT EXTRACTION PHACO AND INTRAOCULAR LENS PLACEMENT (IOC) LEFT 00:51.1  17.9%  9.15;  Surgeon: Leandrew Koyanagi, MD;  Location: [facility];  Service: Ophthalmology;  Laterality: Left;  keep this patient second  . COLON SURGERY     "colon was  fused to bladder - operated on both"  . COLONOSCOPY  09/18/2007   diverticuli, no polyps  . COLONOSCOPY  05/26/2010   diverticuli, no polyps  . PARS PLANA VITRECTOMY Right 05/20/2015   Procedure: PARS PLANA VITRECTOMY WITH 25 GAUGE, laser;  Surgeon: Milus Height, MD;  Location: [facility];  Service: Ophthalmology;  Laterality: Right;  . PARTIAL HYSTERECTOMY  1990  . TUBAL LIGATION      Home Medications:  Allergies as of 02/13/2020      Reactions   Corn-containing Products Diarrhea   headaches   Nitrofurantoin Nausea Only   Tramadol    Nausea and vomiting   Sulfa Antibiotics Rash   As an infant      Medication List       Accurate as of February 13, 2020  9:23 AM. If you have any questions, ask your nurse or doctor.        STOP taking these medications   amLODipine 5 MG tablet Commonly known as: NORVASC Stopped by: Hollice Espy, MD   latanoprost 0.005 % ophthalmic solution Commonly known as: XALATAN Stopped by: Hollice Espy, MD   mometasone 50 MCG/ACT nasal spray Commonly known as: NASONEX Stopped by: Hollice Espy, MD     TAKE these medications   Acetaminophen-Codeine 300-30 MG tablet Commonly known as: TYLENOL/CODEINE #3 Take 1 tablet by mouth every 4 (four) hours as needed for pain.  albuterol 108 (90 Base) MCG/ACT inhaler Commonly known as: Ventolin HFA Inhale 2 puffs into the lungs every 6 (six) hours as needed for wheezing.   brimonidine 0.2 % ophthalmic solution Commonly known as: ALPHAGAN 3 (three) times daily.   brinzolamide 1 % ophthalmic suspension Commonly known as: AZOPT 1 drop 3 (three) times daily.   budesonide-formoterol 80-4.5 MCG/ACT inhaler Commonly known as: Symbicort Take 2 puffs first thing in am and then another 2 puffs about 12 hours later.   escitalopram 20 MG tablet Commonly known as: LEXAPRO Take 1 tablet (20 mg total) by mouth daily.   Flutter Devi Use as directed   folic acid 1 MG tablet Commonly known as: FOLVITE  Take 1 mg by mouth daily.   furosemide 20 MG tablet Commonly known as: LASIX Take 20 mg by mouth 2 (two) times daily.   multivitamin capsule Take 1 capsule by mouth 2 (two) times daily.   NON FORMULARY CPAP nightly   OXYGEN Inhale 2 L into the lungs as needed.   predniSONE 2.5 MG tablet Commonly known as: DELTASONE Take 3 tablets by mouth daily at 2 PM.   Synthroid 137 MCG tablet Generic drug: levothyroxine TAKE 1 TABLET (137 MCG TOTAL) BY MOUTH DAILY BEFORE BREAKFAST.   Treprostinil 0.6 MG/ML Soln Commonly known as: TYVASO Inhale 18 mcg into the lungs 4 (four) times daily. 12 breaths 4 times a day   Trexall 5 MG tablet Generic drug: methotrexate Take 5 tablets by mouth every 7 (seven) days.   Tymlos 3120 MCG/1.56ML Sopn Generic drug: Abaloparatide Inject into the skin.       Allergies:  Allergies  Allergen Reactions  . Corn-Containing Products Diarrhea    headaches  . Nitrofurantoin Nausea Only  . Tramadol     Nausea and vomiting  . Sulfa Antibiotics Rash    As an infant    Family History: Family History  Problem Relation Age of Onset  . Allergies Father   . Asthma Father   . Colon cancer Father   . Allergies Brother   . Asthma Brother   . Breast cancer Maternal Grandmother     Social History:  reports that she has never smoked. She has never used smokeless tobacco. She reports that she does not drink alcohol or use drugs.   Physical Exam: BP (!) 149/78   Pulse (!) 105   Ht _0  (1.702 m)   Wt 190 lb (86.2 kg)   BMI 29.76 kg/m   Constitutional:  Alert and oriented, No acute distress. HEENT: [place] AT, moist mucus membranes.  Trachea midline, no masses. Cardiovascular: No clubbing, cyanosis, or edema. Respiratory: Normal respiratory effort, no increased work of breathing. Skin: No rashes, bruises or suspicious lesions. Neurologic: Grossly intact, no focal deficits, moving all 4 extremities. Psychiatric: Normal mood and affect.  Laboratory  Data:  Lab Results  Component Value Date   CREATININE 1.11 (H) 02/11/2020    Lab Results  Component Value Date   HGBA1C 6.0 (H) 04/20/2017    Pertinent Imagings:    CLINICAL DATA:  Microscopic hematuria, urinary urgency  EXAM: CT ABDOMEN AND PELVIS WITHOUT AND WITH CONTRAST  TECHNIQUE: Multidetector CT imaging of the abdomen and pelvis was performed following the standard protocol before and following the bolus administration of intravenous contrast.  CONTRAST:  151m OMNIPAQUE IOHEXOL 300 MG/ML  SOLN  COMPARISON:  12/18/2010  FINDINGS: Lower chest: Fibrosis and subpleural bronchiolectasis of the included bilateral lung bases, worsened compared to prior examination dated 12/18/2010.  Coronary artery calcifications.  Hepatobiliary: No solid liver abnormality is seen. No gallstones, gallbladder wall thickening, or biliary dilatation.  Pancreas: Unremarkable. No pancreatic ductal dilatation or surrounding inflammatory changes.  Spleen: Normal in size without significant abnormality.  Adrenals/Urinary Tract: Adrenal glands are unremarkable. Small nonobstructive bilateral renal calculi. There is a 4 mm calculus in the distal third of the left ureter (series 8, image 68). No hydronephrosis.  Bladder is unremarkable.  Stomach/Bowel: Stomach is within normal limits. Large incidental diverticula of the descending and ascending duodenum. No evidence of bowel wall thickening, distention, or inflammatory changes. Occasional sigmoid diverticula.  Vascular/Lymphatic: Aortic atherosclerosis. No enlarged abdominal or pelvic lymph nodes.  Reproductive: Status post hysterectomy.  Other: No abdominal wall hernia or abnormality. No abdominopelvic ascites.  Musculoskeletal: No acute or significant osseous findings.  IMPRESSION: 1. There is a 4 mm calculus in the distal third of the left ureter. No hydronephrosis.  2.  Additional small nonobstructive  bilateral renal calculi.  2. Fibrosis and subpleural bronchiolectasis of the included bilateral lung bases, worsened compared to prior examination dated 12/18/2010. Consider dedicated ILD protocol CT examination of the chest to further evaluate.  3.  Coronary artery disease.  Aortic Atherosclerosis (ICD10-I70.0).   Electronically Signed   By: Eddie Candle M.D.   On: 02/11/2020 15:36  I have personally reviewed the images and agree with radiologist interpretation.   Assessment/ Plan:  1. Microscopic hematuria  CT from 01/2720 revealed  revealed a 4 mm calculus in the distal third of the left ureter w/ no hydronephrosis. Additional small nonobstructive bilateral renal calculi noted.  Likely secondary to stone   Will complete work-up with cystoscopy at the time of stone treatment.  2. Ureterolithiasis  We discussed various treatment options including ESWL vs. ureteroscopy, laser lithotripsy, and stent. We discussed the risks and benefits of both including bleeding, infection, damage to surrounding structures, efficacy with need for possible further intervention, and need for temporary ureteral stent. Given that her irritative urinary symptoms began to 3 months ago, this is suggestive that the stone may be contributing factor.  Given that she is not passed this spontaneously in the interim, I recommended surgical intervention. Recommended ureteroscopy, laser lithotripsy, and stent w/ cystoscopy.  Pt understood and will proceed w/ plan   I, Lucas Mallow, am acting as a scribe for Dr. Hollice Espy,  I have reviewed the above documentation for accuracy and completeness, and I agree with the above.   Hollice Espy, MD

## 2020-02-13 ENCOUNTER — Other Ambulatory Visit: Payer: Self-pay

## 2020-02-13 ENCOUNTER — Other Ambulatory Visit: Payer: Self-pay | Admitting: Radiology

## 2020-02-13 ENCOUNTER — Other Ambulatory Visit: Payer: Self-pay | Admitting: Urology

## 2020-02-13 ENCOUNTER — Ambulatory Visit (INDEPENDENT_AMBULATORY_CARE_PROVIDER_SITE_OTHER): Payer: BC Managed Care – PPO | Admitting: Urology

## 2020-02-13 ENCOUNTER — Encounter: Payer: Self-pay | Admitting: Urology

## 2020-02-13 VITALS — BP 149/78 | HR 105 | Ht 67.0 in | Wt 190.0 lb

## 2020-02-13 DIAGNOSIS — R3129 Other microscopic hematuria: Secondary | ICD-10-CM | POA: Diagnosis not present

## 2020-02-13 DIAGNOSIS — N200 Calculus of kidney: Secondary | ICD-10-CM | POA: Diagnosis not present

## 2020-02-13 DIAGNOSIS — N390 Urinary tract infection, site not specified: Secondary | ICD-10-CM | POA: Diagnosis not present

## 2020-02-16 LAB — CULTURE, URINE COMPREHENSIVE

## 2020-02-17 ENCOUNTER — Other Ambulatory Visit: Payer: Self-pay

## 2020-02-17 ENCOUNTER — Encounter
Admission: RE | Admit: 2020-02-17 | Discharge: 2020-02-17 | Disposition: A | Discharge status: Home / Self Care | Payer: BC Managed Care – PPO | Source: Ambulatory Visit | Attending: Urology | Admitting: Urology

## 2020-02-17 DIAGNOSIS — H401131 Primary open-angle glaucoma, bilateral, mild stage: Secondary | ICD-10-CM | POA: Diagnosis not present

## 2020-02-17 DIAGNOSIS — M810 Age-related osteoporosis without current pathological fracture: Secondary | ICD-10-CM | POA: Diagnosis not present

## 2020-02-17 HISTORY — DX: Gastro-esophageal reflux disease without esophagitis: K21.9

## 2020-02-17 HISTORY — DX: Cardiac murmur, unspecified: R01.1

## 2020-02-17 HISTORY — DX: Personal history of urinary calculi: Z87.442

## 2020-02-17 LAB — URINALYSIS, COMPLETE
Bilirubin, UA: NEGATIVE
Glucose, UA: NEGATIVE
Ketones, UA: NEGATIVE
Leukocytes,UA: NEGATIVE
Nitrite, UA: NEGATIVE
Specific Gravity, UA: 1.025 (ref 1.005–1.030)
Urobilinogen, Ur: 0.2 mg/dL (ref 0.2–1.0)
pH, UA: 5.5 (ref 5.0–7.5)

## 2020-02-17 LAB — MICROSCOPIC EXAMINATION: Bacteria, UA: NONE SEEN

## 2020-02-17 NOTE — Pre-Procedure Instructions (Signed)
Progress Notes - documented in this encounter Rackley, Esther Hardy, MD - 11/11/2019 8:40 AM EST Formatting of this note is different from the original. INTERSTITIAL LUNG DISEASE PROGRAM   Primary Pulmonologist and/or Rheumatologist: Roselie Awkward PMD: Eulah Pont, MD  Problem List: Sarcoidosis  Chief Complaint: f/u ILD  Clinical Summary: Patient is a 64 y.o. woman who began to have shortness of breath with exertion and cough around 2006. She was initially thought to have asthma. She was not improving, so she was sent to see pulmonary at [facility]. She had a bronch with biopsy in 10/09 showing non-caseating granulomas, and she was given a diagnosis of pulmonary sarcoidosis.Marland Kitchen She was given inhalers and prednisone. She was on prednisone for 6 months-1 year, and she returned back to near normal. She came off for a year or two, and she was doing well. She began to have shortness of breath and cough again, so she was restarted on prednisone. Since approximately 2012, she had been on prednisone with doses as high as 60 mg a day and no lower than 7.5 mg a day. For most of that timeframe she was on 10 to 20 mg daily. When she has gone below 7.5 mg a day, she has noticed signs and symptoms of adrenal insufficiency. Over this period of time she has been on methotrexate, mycophenolate, and azathioprine. She felt that methotrexate was the most effective for her. At the time of initial visit here, her prednisone was at 7.5 mg daily, and she was on no other steroid sparing drugs. She has had complications of compression fractures in her back, obesity, and cataracts due to prednisone. Overall, she feels she has had a gradual worsening over the past several years. Worse following compression fractures. She was sent here for a second opinion regarding her sarcoidosis.  She was first seen in our ILD clinic in 5/19. Her pulmonary function test showed mild restriction, mildly impaired DLCO, and severe obstruction. CT  scan from 5/16 showed mid to upper lung zone predominant reticular and groundglass changes with a more central/peribronchovascular localization. There was associated traction bronchiectasis and architectural distortion. She had mild mediastinal adenopathy.   She was thought to have a diagnosis of pulmonary sarcoidosis, and it was unclear if her disease was active or if she was simply left with stage IV sarcoid and symptoms from that. We recommended a repeat CT scan, and she is in to review that today.  Interval History: Overall breathing has continued to decline. She is more short of breath with exertion. Right now she can walk approximately 30 feet before becoming short of breath. She has minimal cough. No lower extremity edema recently, has not been taking the furosemide. Dieting recently, lost about 6 pounds. Currently taking prednisone 20 mg daily in addition to methotrexate 25 mg weekly. She continues on daily folic acid as well. Tyvasso to arrive Friday.  Current Functional Status:  Oxygen: Was using it all day recently, needs it for any kind of exertion including standing, using anywhere from 1-2L with exertion. Uses CPAP at night with 2L. Level of exertion that leads to dyspnea: showering or changing clothes Rehab: Previously completed in 2019, none since.   Review of Systems: (All symptoms below reviewed and negative unless presented in bold.)  Fatigue Fever Focal weakness Focal numbness Headache Changes in vision Cough Hemoptysis Wheezing Chest pain Palpitations Syncope Heartburn Joint pain/swelling Muscle pain Rash or skin changes  A thorough review of 10 organ systems is otherwise negative.   Current  Medications: Current Outpatient Medications  Medication Sig Last Dose  . albuterol 90 mcg/actuation inhaler Inhale 2 inhalations into the lungs every 6 (six) hours as needed 2 puffs. Frequency:PHARMDIR Dosage:90 MCG Instructions: Note:2 puff Q 4 -6 hrs. & as needed For  SOB/Wheezing Dose: 90 MCG PRN Not Currently Taking  . amLODIPine (NORVASC) 10 MG tablet Take 1 tablet (10 mg total) by mouth once daily Taking  . azithromycin (ZITHROMAX) 250 MG tablet Take 1 tablet (250 mg total) by mouth once daily Taking  . brimonidine (ALPHAGAN) 0.2 % ophthalmic solution INSTILL 1 DROP INTO BOTH EYES TWICE A DAY Taking  . budesonide-formoteroL (SYMBICORT) 80-4.5 mcg/actuation inhaler Inhale 2 inhalations into the lungs 2 (two) times daily Taking  . docosahexanoic acid/epa (FISH OIL ORAL) Take 1 capsule by mouth once daily Taking  . ergocalciferol, vitamin D2, 50,000 unit capsule TAKE ONE CAPSULE BY MOUTH ONE TIME PER WEEK Taking  . escitalopram oxalate (LEXAPRO) 10 MG tablet once daily Taking  . folic acid (FOLVITE) 1 MG tablet TAKE 1 TABLET BY MOUTH EVERY DAY Taking  . FUROsemide (LASIX) 20 MG tablet TAKE 2 TABLETS (40 MG TOTAL) BY MOUTH ONCE DAILY AS NEEDED FOR EDEMA PRN Not Currently Taking  . levothyroxine (SYNTHROID, LEVOTHROID) 137 MCG tablet Take 137 mcg by mouth once daily Taking  . methotrexate 5 MG tablet Take 5 tablets (25 mg total) by mouth every 7 (seven) days Taking  . multivitamin tablet Take 3 tablets by mouth 2 (two) times daily Taking  . predniSONE (DELTASONE) 2.5 MG tablet Take 3 tablets (7.5 mg total) by mouth once daily Taking  . triamcinolone (NASACORT AQ) 55 mcg nasal spray Place 2 sprays into both nostrils once daily. Taking  . TYMLOS 80 mcg (3,120 mcg/1.56 mL) PnIj INJECT 80 MCG UNDER THE SKIN (SUBCUTANEOUS INJECTION) ONCE DAILY Taking  . VITAMIN B COMPLEX ORAL Take 1 tablet by mouth once daily Taking  . predniSONE (DELTASONE) 10 MG tablet Take 1 tablet (10 mg total) by mouth once daily (Patient not taking: Reported on 11/11/2019 ) Not Taking  . sildenafiL, pulm.hypertension, (REVATIO) 20 mg tablet Take 1 tablet (20 mg total) by mouth 3 (three) times daily   Physical Exam:  BP 138/71  Pulse 95  Temp 37.1 C (98.8 F) (Oral)  Resp 20  Ht 172.7 cm  (_0 )  Wt 90.9 kg (200 lb 6.4 oz)  SpO2 100% Comment: ra  BMI 30.47 kg/m  Constitutional: Well appearing, overweight, NAD HEENT: EOMI, PERRL, OP clear Neck: No lymphadenopathy. No JVD. Respiratory: CTA B Cardiovascular: RRR, no murmurs, rubs, or gallops Gastrointestinal: soft, non-tender, non-distended, obese Extremities: No clubbing, cyanosis, or edema  Skin: No rashes noted  Musculoskeletal: 5/5 strength in bilateral upper and lower extremities, no swelling in the joints of the hands Neuro: grossly intact Psych: normal affect   Laboratory Data/Test Results: I personally reviewed and interpreted chest x-ray performed today to show bilateral reticular opacities. Compared to prior chest x-ray performed 05/27/2018 we see an overall decrease in interstitial markings and hilar fullness.  I personally reviewed and interpreted pulmonary function test performed today to show an FVC of 53% predicted, evidence of obstruction with a ratio of 0.51. DLCO was not performed. Compared to prior pulmonary function tests performed back through 10/19 we see overall stability in FVC but a slight increase in degree of obstruction.  I personally reviewed and interpreted 6-minute walk test performed 10/23/2019. Patient had a room air resting O2 sat of 96%. She walked on 2  L/min for a total walk distance of 272 m with a sat nadir of 93% and a Borg dyspnea scale score of 6. Compared to prior walk test performed in 9/20 we see a slight decline in her walk distance. Compared to walk test performed in 12/19, we see a decrease in walk distance of approximately 60 m and a decrease in sat nadir from 95%.  Echocardiogram from 10/23/2019 shows normal left and right ventricular function with an estimated RVSP of 67 mmHg. Compared to prior from 11/19 RV strain is slightly higher.  CBC, CMP, and proBNP were reviewed from 10/23/2019. She has a leukocytosis, which is stable over the last several months, and her labs are otherwise  normal.   Impression: Patient is a 64 y.o. woman with moderate to severe stage IV pulmonary sarcoidosis, mild to moderate pulmonary hypertension, and chronic exertional hypoxemia. She has significant obstruction on her spirometry, and moderately reduced DLCO, and desaturates with walking requiring 2 LPM of oxygen.   Over the last year, she has had an increased dose of prednisone and methotrexate. She is planning to start inhaled Tyvaso for pulmonary hypertension. She has gained approximately 25 pounds over the past year, and she has also noted an increase in dyspnea. Her chest x-ray looks slightly improved to stable from 1 year ago. Her pulmonary function tests are largely stable, though she does have severe obstruction. I am concerned that her shortness of breath is related to a combination of severe obstructive lung disease from sarcoidosis, chronic hypoxemic respiratory failure, pulmonary hypertension, obesity, and deconditioning.  Plan:  -Decrease prednisone back to 7.5 mg daily over the next few weeks. We will continue this dose until her next visit. Given her prior history of compression fractures in her back and weight gain, the higher dose of prednisone may actually be causing more problems. -continue methotrexate 25 mg p.o. weekly.  -we will monitor CBC and CMP every 3 to 4 months for methotrexate toxicity. -Folic acid 1 mg daily to reduce risk of marrow suppression while on methotrexate. -wear oxygen at 2 LPM with exertion. -I have strongly recommended that the patient maintain a regular exercise program. -We discussed weight loss and the importance of trying to get back to her prior weight to minimize excess weight. Which will exacerbate her dyspnea. -Begin Tyvaso and titrate per pulmonary hypertension recommendations. -Eye exam: Annually without evidence of ocular sarcoid -EKG: None on record. Cardiac MRI normal in 2009. Echo in 2021 largely normal other than pulmonary hypertension -Ca:  Has been borderline elevated in the past, but normal recently -Pneumococcal vaccine (13 valent): 2/20 -Pneumococcal vaccine (23 valent): 11/18 -Influenza vaccine: 10/19 -Follow-up in 4 months with PFTs. We will assess her response to Tyvaso and decreased prednisone and adjust as needed at that time.   CRAIG Milford Cage, MD   Electronically signed by Kae Heller, MD at 11/15/2019 12:52 PM EST

## 2020-02-17 NOTE — Pre-Procedure Instructions (Signed)
Echo complete1/03/2020 [place] Component Name Value Ref Range  LV Ejection Fraction (%) 55 %  Right Ventricle Systolic Pressure (mmHg) 67 mmHg  Left [facility] Diameter (cm) 3.0 cm  LV End Diastolic Diameter (cm) 4.2 cm  LV End Systolic Diameter (cm) 2.4 cm  LV Septum Wall Thickness (cm) 1.1 cm  LV Posterior Wall Thickness (cm) 1.1 cm  Tricuspid Valve Regurgitation Grade moderate   Tricuspid Valve Regurgitation Max Velocity (m/s) 4.0 m/s  Mitral Valve Regurgitation Grade none   Mitral Valve Stenosis Grade none   Aortic Valve Regurgitation Grade none   Aortic Valve Stenosis Grade none   Result Narrative        [facility]      Stacie [name], Stacie [name]                            [id number]  DOB: [birth year]         CARDIAC DIAGNOSTIC UNIT        Date: 10/23/2019 08:00:00                            Adult   Female Age: 64          ECHO-DOPPLER REPORT         Outpatient                            MPDC ---------------------------------------------------- MD1: Windy Canny   STUDY: Chest Wall     TAPE: 0000:00:0:00:00 BP: 157/78    ECHO: Yes  DOPPLER: Yes FILE: [id number]:  HR: 100   COLOR: Yes CONTRAST: No   MACHINE: [id number] #5 Height: 67 in RV BIOPSY: No     3D: No  SOUND QLTY: Moderate Weight: 193 lbs   MEDIUM: None                   BSA: 2.03, BMI: 30.20 ------------------------------------------------------------------------------   HISTORY: Sarcoidosis and Pulmonary hyper   REASON: Assess RV function Assess Tricuspid Regurgitation INDICATION: I27.0 - Primary pulmonary hypertension (CMS-HCC). R06.00 -       Dyspnea, unspecified. R06.89 - Other abnormalities of breathing.   ECHOCARDIOGRAPHIC MEASUREMENTS ----------------------------------------------- 2D  DIMENSIONS AORTA     Values   Normal RangeMAIN PA   Values   Normal Range    Annulus: nm* cm  [1.9 - 2.7]  PA Main:  3.0 cm  [1.5 - 2.1]   Aorta Sin:  2.8 cm  [2.4 - 3.6] RIGHT VENTRICLE  ST Junction: nm* cm  [2 - 3.2]   RV Base:  3.2 cm  [2.5 - 4.1]   Asc.Aorta:  3.6 cm  [1.9 - 3.5]   RV Mid:  2.9 cm  [1.9 - 3.5] LEFT VENTRICLE             RV Length: nm* cm  _0      LVIDd:  4.2 cm  [3.7 - 5.3] RIGHT ATRIUM     LVIDs:  2.4 cm  [2.2 - 3.4]  RA Area: 19  cm2  [ <= 20]    LVEDVi: nm* ml/m2 [29 - 61]     RAVi: 32  ml/m2 [15 - 27]    LVESVi: nm* ml/m2 [8 - 24]  INFERIOR VENA CAVA      FS: 43  %   [ >= 25]    Max.IVC:  1.3  cm  [ <= 2.1]      SWT:  1.1 cm  [0.6 - 0.9]  Min.IVC: nm* cm  [ <= 1.7]      PWT:  1.1 cm  [0.6 - 0.9] __________________ LEFT ATRIUM              nm* - not measured    LA Diam:  3.0 cm  [2.7 - 3.8]    LA Area: 14  cm2  [ <= 20]   LA Volume: 36  ml  [22 - 52]     LAVi: 18  ml/m2 [16 - 34]  ECHOCARDIOGRAPHIC DESCRIPTIONS ----------------------------------------------- AORTIC ROOT     Size: MILDLY DILATED  Dissection: INDETERM FOR DISSECTION  AORTIC VALVE   Leaflets: Tricuspid       Morphology: MILDLY THICKENED   Mobility: Fully Mobile  LEFT VENTRICLE                   Anterior: Normal     Size: Normal                 Lateral: Normal  Contraction: Normal                 Septal: Normal  Closest EF: >55% (Estimated)            Apical: Normal   LV masses: No Masses               Inferior: Normal      LVH: MILD LVH               Posterior: Normal  LV GLS(AVG): -21.9% Normal Range [ <= -16] Dias.FxClass: N/A  MITRAL VALVE   Leaflets: Normal         Mobility: Fully mobile   Morphology: Normal  LEFT ATRIUM     Size: Normal   LA masses: No masses        Normal IAS  MAIN PA     Size: MILDLY DILATED  PULMONIC VALVE  Morphology: Normal   Mobility: Fully Mobile  RIGHT VENTRICLE     Size: Normal          Free wall: Normal  Contraction: Normal          RV masses: CATHETER IN RV    RV GLS: -20.5% Normal Range [ <= -15]  TRICUSPID VALVE   Leaflets: Normal         Mobility: Fully mobile  Morphology: Normal  RIGHT ATRIUM     Size: Normal           RA Other: None   RA masses: CATHETER IN RA  PERICARDIUM     Fluid: No effusion  INFERIOR VENACAVA     Size: Normal   Normal respiratory collapse  DOPPLER ECHO and OTHER SPECIAL PROCEDURES ------------------------------------   Aortic: No AR         No AS    Mitral: No MR         No MS   MV Inflow E Vel.= nm* cm/s MV Annulus E'Vel.= nm* cm/s E/E'Ratio= nm*  Tricuspid: MODERATE TR      No TS       4.0 m/s peak TR vel  67 mmHg peak RV pressure  Pulmonary: TRIVIAL PR       No PS    Other:  INTERPRETATION ---------------------------------------------------------------  NORMAL LEFT VENTRICULAR SYSTOLIC FUNCTION WITH MILD LVH  NORMAL RIGHT VENTRICULAR SYSTOLIC FUNCTION  VALVULAR REGURGITATION: TRIVIAL PR, MODERATE TR  NO VALVULAR STENOSIS   Compared with prior Echo study on 08/28/2018: RV strain is slightly higher  otherwise no significant changes.   (Report version 3.0)          Interpreted and Electronically signed  Perform. by: Wendy Poet, BS, [place]       by: Corey Skains, M  Resp.Person: Wendy Poet, BS, [facility]       On: 10/23/2019 16:25:29  Other Result Information  Interface, Text Results In - 10/23/2019  4:26 PM EST            [facility]            Stacie [name], Presti                                                       [id number]    DOB: [birth year]                CARDIAC DIAGNOSTIC UNIT               Date: 10/23/2019 08:00:00                                                      Adult     Female Age: 67                  ECHO-DOPPLER REPORT                 Outpatient                                                      MPDC ---------------------------------------------------- MD1: Windy Canny     STUDY: Chest Wall          TAPE: 0000:00:0:00:00 BP: 157/78      ECHO: Yes   DOPPLER: Yes  FILE: [id number]:   HR: 100     COLOR: Yes  CONTRAST: No      MACHINE: [email or web address] #5 Height: 67 in RV BIOPSY: No         3D: No   SOUND QLTY: Moderate  Weight: 193 lbs    MEDIUM: None                                      BSA: 2.03,  BMI: 30.20 ------------------------------------------------------------------------------    HISTORY:  Sarcoidosis and Pulmonary hyper     REASON: Assess RV function Assess Tricuspid Regurgitation INDICATION: I27.0 - Primary pulmonary hypertension (CMS-HCC). R06.00 -             Dyspnea, unspecified. R06.89 - Other abnormalities of breathing.   ECHOCARDIOGRAPHIC MEASUREMENTS ----------------------------------------------- 2D DIMENSIONS AORTA          Values     Normal RangeMAIN PA      Values     Normal Range      Annulus:  nm*  cm    [1.9 - 2.7]  PA Main:   3.0 cm    [1.5 - 2.1]    Aorta Sin:   2.8 cm    [2.4 - 3.6] RIGHT VENTRICLE  ST Junction:  nm*  cm    [2 - 3.2]      RV Base:   3.2 cm    [2.5 - 4.1]    Asc.Aorta:   3.6 cm    [1.9 - 3.5]     RV Mid:   2.9 cm    [1.9 - 3.5] LEFT VENTRICLE                         RV Length:  nm*  cm    [  ]        LVIDd:   4.2 cm    [3.7 - 5.3] RIGHT ATRIUM        LVIDs:   2.4 cm    [2.2 - 3.4]    RA Area:  19   cm2   [ <= 20]       LVEDVi:  nm*  ml/m2 [29 - 61]         RAVi:  32   ml/m2 [15 - 27]       LVESVi:  nm*  ml/m2 [8 - 24]    INFERIOR VENA CAVA           FS:  43   %     [ >= 25]       Max.IVC:   1.3 cm    [ <= 2.1]          SWT:   1.1  cm    [0.6 - 0.9]    Min.IVC:  nm*  cm    [ <= 1.7]          PWT:   1.1 cm    [0.6 - 0.9] __________________ LEFT ATRIUM                           nm* - not measured      LA Diam:   3.0 cm    [2.7 - 3.8]      LA Area:  14   cm2   [ <= 20]    LA Volume:  36   ml    [22 - 52]         LAVi:  18   ml/m2 [16 - 34]  ECHOCARDIOGRAPHIC DESCRIPTIONS ----------------------------------------------- AORTIC ROOT         Size: MILDLY DILATED   Dissection: INDETERM FOR DISSECTION  AORTIC VALVE     Leaflets: Tricuspid             Morphology: MILDLY THICKENED     Mobility: Fully Mobile  LEFT VENTRICLE                                      Anterior: Normal         Size: Normal                                 Lateral: Normal  Contraction: Normal  Septal: Normal   Closest EF: >55% (Estimated)                        Apical: Normal    LV masses: No Masses                             Inferior: Normal          LVH: MILD LVH                             Posterior: Normal  LV GLS(AVG): -21.9% Normal Range [ <= -16] Dias.FxClass: N/A  MITRAL VALVE     Leaflets: Normal                  Mobility: Fully mobile   Morphology: Normal  LEFT ATRIUM         Size: Normal    LA masses: No masses               Normal IAS  MAIN PA         Size: MILDLY DILATED  PULMONIC VALVE   Morphology: Normal     Mobility: Fully Mobile  RIGHT VENTRICLE         Size: Normal                    Free wall: Normal  Contraction: Normal                    RV masses: CATHETER IN RV       RV GLS: -20.5% Normal Range [ <= -15]  TRICUSPID VALVE     Leaflets: Normal                  Mobility: Fully mobile   Morphology: Normal  RIGHT ATRIUM         Size: Normal                     RA Other: None    RA masses: CATHETER IN RA  PERICARDIUM        Fluid: No effusion  INFERIOR VENACAVA         Size: Normal     Normal respiratory collapse  DOPPLER ECHO and OTHER SPECIAL PROCEDURES  ------------------------------------    Aortic: No AR                  No AS     Mitral: No MR                  No MS    MV Inflow E Vel.= nm* cm/s  MV Annulus E'Vel.= nm* cm/s  E/E'Ratio= nm*  Tricuspid: MODERATE TR            No TS            4.0 m/s peak TR vel   67 mmHg peak RV pressure  Pulmonary: TRIVIAL PR             No PS      Other:  INTERPRETATION ---------------------------------------------------------------   NORMAL LEFT VENTRICULAR SYSTOLIC FUNCTION WITH MILD LVH   NORMAL RIGHT VENTRICULAR SYSTOLIC FUNCTION   VALVULAR REGURGITATION: TRIVIAL PR, MODERATE TR   NO VALVULAR STENOSIS    Compared with prior Echo study on 08/28/2018: RV strain is slightly higher   otherwise no significant  changes.   (Report version 3.0)                    Interpreted and Electronically signed  Perform. by: Wendy Poet, BS, [place]             by: Corey Skains, M  Resp.Person: Wendy Poet, [place], [facility]             On: 10/23/2019 16:25:29  Status Results Details   Encounter Summary

## 2020-02-17 NOTE — Pre-Procedure Instructions (Signed)
SECURE CHAT WITH DR RAMSDELL:  Pt having cysto with stent placement with [place] on 5-6. Pt has sarcoidosis along with pulmonary htn. She is on a nebulizer qid along with inhalers. She has sleep apnea and uses cpap with O2. Also has to use O2 with exertion. I have copy and pasted a note from [place] from her pulmonologist along with a recent echo done in Jan. Do we need clearance from pulmonary prior to surgery?  Yes please thank you! we don't necessarily need a new office visit if there is a recent one, but we still need them to sign off given her issues   Madaline Brilliant will do!

## 2020-02-17 NOTE — Patient Instructions (Addendum)
Your procedure is scheduled on: 02-20-20 THURSDAY Report to Same Day Surgery 2nd floor medical mall [facility] Entrance-take elevator on left to 2nd floor.  Check in with surgery information desk.) To find out your arrival time please call [phone] between 1PM - 3PM on 02-19-20 [facility]  Remember: Instructions that are not followed completely may result in serious medical risk, up to and including death, or upon the discretion of your surgeon and anesthesiologist your surgery may need to be rescheduled.    _x___ 1. Do not eat food after midnight the night before your procedure. NO GUM OR CANDY AFTER MIDNIGHT. You may drink clear liquids up to 2 hours before you are scheduled to arrive at the hospital for your procedure.  Do not drink clear liquids within 2 hours of your scheduled arrival to the hospital.  Clear liquids include  --Water or Apple juice without pulp  --Gatorade  --Black Coffee or Clear Tea (No milk, no creamers, do not add anything to the coffee or Tea-sugar ok to add)   ____Ensure clear carbohydrate drink on the way to the hospital for bariatric patients  ____Ensure clear carbohydrate drink 3 hours before surgery.      __x__ 2. No Alcohol for 24 hours before or after surgery.   __x__3. No Smoking or e-cigarettes for 24 prior to surgery.  Do not use any chewable tobacco products for at least 6 hour prior to surgery   ____  4. Bring all medications with you on the day of surgery if instructed.    __x__ 5. Notify your doctor if there is any change in your medical condition     (cold, fever, infections).    x___6. On the morning of surgery brush your teeth with toothpaste and water.  You may rinse your mouth with mouth wash if you wish.  Do not swallow any toothpaste or mouthwash.   Do not wear jewelry, make-up, hairpins, clips or nail polish.  Do not wear lotions, powders, or perfumes. You may wear deodorant.  Do not shave 48 hours prior to surgery. Men may shave  face and neck.  Do not bring valuables to the hospital.    [facility] is not responsible for any belongings or valuables.               Contacts, dentures or bridgework may not be worn into surgery.  Leave your suitcase in the car. After surgery it may be brought to your room.  For patients admitted to the hospital, discharge time is determined by your  treatment team.  _  Patients discharged the day of surgery will not be allowed to drive home.  You will need someone to drive you home and stay with you the night of your procedure.    Please read over the following fact sheets that you were given:   [facility] Preparing for Surgery   _x___ TAKE THE FOLLOWING MEDICATION THE MORNING OF SURGERY WITH A SMALL SIP OF WATER. These include:  1. SYNTHROID (LEVOTHYROXINE)  2. LEXAPRO (ESCITALOPRAM)  3. PREDNISONE  4.  5.  6.  ____Fleets enema or Magnesium Citrate as directed.   ____ Use CHG Soap or sage wipes as directed on instruction sheet   _X___ Use inhalers on the day of surgery and bring to hospital day of surgery-USE YOUR TYVASO NEBULIZER, SYMBICORT AND ALBUTEROL INHALER AM OF SURGERY AND BRING ALBUTEROL [place]  ____ Stop Metformin and Janumet 2 days prior to surgery.  ____ Take 1/2 of usual insulin dose the night before surgery and none on the morning surgery.   ____ Follow recommendations from Cardiologist, Pulmonologist or PCP regarding stopping Aspirin, Coumadin, Plavix ,Eliquis, Effient, or Pradaxa, and Pletal.  X____Stop Anti-inflammatories such as Advil, Aleve, Ibuprofen, Motrin, Naproxen, Naprosyn, Goodies powders or aspirin products NOW-OK to take Tylenol    ____ Stop supplements until after surgery.     _X___ Bring C-Pap to the hospital.

## 2020-02-18 ENCOUNTER — Encounter
Admission: RE | Admit: 2020-02-18 | Discharge: 2020-02-18 | Disposition: A | Discharge status: Home / Self Care | Payer: BC Managed Care – PPO | Source: Ambulatory Visit | Attending: Urology | Admitting: Urology

## 2020-02-18 ENCOUNTER — Other Ambulatory Visit: Payer: BC Managed Care – PPO

## 2020-02-18 ENCOUNTER — Telehealth: Payer: Self-pay | Admitting: Radiology

## 2020-02-18 DIAGNOSIS — Z01818 Encounter for other preprocedural examination: Secondary | ICD-10-CM | POA: Insufficient documentation

## 2020-02-18 DIAGNOSIS — Z20822 Contact with and (suspected) exposure to covid-19: Secondary | ICD-10-CM | POA: Insufficient documentation

## 2020-02-18 DIAGNOSIS — I272 Pulmonary hypertension, unspecified: Secondary | ICD-10-CM | POA: Diagnosis not present

## 2020-02-18 DIAGNOSIS — G4733 Obstructive sleep apnea (adult) (pediatric): Secondary | ICD-10-CM | POA: Diagnosis not present

## 2020-02-18 LAB — CBC
HCT: 40.8 % (ref 36.0–46.0)
Hemoglobin: 13.3 g/dL (ref 12.0–15.0)
MCH: 30.8 pg (ref 26.0–34.0)
MCHC: 32.6 g/dL (ref 30.0–36.0)
MCV: 94.4 fL (ref 80.0–100.0)
Platelets: 281 10*3/uL (ref 150–400)
RBC: 4.32 MIL/uL (ref 3.87–5.11)
RDW: 13.7 % (ref 11.5–15.5)
WBC: 12.4 10*3/uL — ABNORMAL HIGH (ref 4.0–10.5)
nRBC: 0 % (ref 0.0–0.2)

## 2020-02-18 LAB — BASIC METABOLIC PANEL
Anion gap: 8 (ref 5–15)
BUN: 23 mg/dL (ref 8–23)
CO2: 32 mmol/L (ref 22–32)
Calcium: 8.7 mg/dL — ABNORMAL LOW (ref 8.9–10.3)
Chloride: 103 mmol/L (ref 98–111)
Creatinine, Ser: 1.04 mg/dL — ABNORMAL HIGH (ref 0.44–1.00)
GFR calc Af Amer: >60 mL/min (ref >60)
GFR calc non Af Amer: 57 mL/min — ABNORMAL LOW (ref >60)
Glucose, Bld: 139 mg/dL — ABNORMAL HIGH (ref 70–99)
Potassium: 3 mmol/L — ABNORMAL LOW (ref 3.5–5.1)
Sodium: 143 mmol/L (ref 135–145)

## 2020-02-18 LAB — SARS CORONAVIRUS 2 (TAT 6-24 HRS): SARS Coronavirus 2: NEGATIVE

## 2020-02-18 NOTE — Telephone Encounter (Signed)
Notified patient of low potassium. Encouraged eating high potassium containing foods prior to surgery. Patient verbalized understanding.

## 2020-02-18 NOTE — Pre-Procedure Instructions (Addendum)
REGARDING PULMONOLOGY CLEARANCE REQUEST  I called Dr. Sherry Ruffing office this morning at 8:30 to follow up on the clearance that was requested yesterday with a short turn around time. I left a message on the physician's direct voicemail.   Will update as information arrives.   UPDATE 02/18/20 @ 1505: Amy Heidel with Dr. Erlene Quan spoke with Dr. Sherry Ruffing office and obtained a second fax number. She reports that it was faxed to the second number and the office staff said they were taking care of it.

## 2020-02-18 NOTE — Telephone Encounter (Signed)
-----  Message from Hollice Espy, MD sent at 02/18/2020 12:23 PM EDT ----- Potassium is low.  Please have her eat a lot of high potassium food prior to the day of surgery.  Anesthesia will likely recheck on the AM for surgery.  Hollice Espy, MD

## 2020-02-19 NOTE — Pre-Procedure Instructions (Signed)
Pulmonary clearance on chart-medium risk

## 2020-02-19 NOTE — Pre-Procedure Instructions (Signed)
Called to Dr Rackley's office regarding clearance. Had to leave a message and I left my direct # office can contact me at to let me know if pt is cleared for surgery

## 2020-02-20 ENCOUNTER — Ambulatory Visit
Admission: RE | Admit: 2020-02-20 | Discharge: 2020-02-20 | Disposition: A | Discharge status: Home / Self Care | Payer: BC Managed Care – PPO | Attending: Urology | Admitting: Urology

## 2020-02-20 ENCOUNTER — Other Ambulatory Visit: Payer: Self-pay

## 2020-02-20 ENCOUNTER — Ambulatory Visit: Payer: BC Managed Care – PPO | Admitting: Anesthesiology

## 2020-02-20 ENCOUNTER — Encounter
Admission: RE | Disposition: A | Discharge status: Home / Self Care | Payer: Self-pay | Source: Home / Self Care | Attending: Urology

## 2020-02-20 ENCOUNTER — Encounter: Payer: Self-pay | Admitting: Urology

## 2020-02-20 ENCOUNTER — Ambulatory Visit: Payer: BC Managed Care – PPO

## 2020-02-20 DIAGNOSIS — E039 Hypothyroidism, unspecified: Secondary | ICD-10-CM | POA: Diagnosis not present

## 2020-02-20 DIAGNOSIS — E782 Mixed hyperlipidemia: Secondary | ICD-10-CM | POA: Diagnosis not present

## 2020-02-20 DIAGNOSIS — Z79899 Other long term (current) drug therapy: Secondary | ICD-10-CM | POA: Insufficient documentation

## 2020-02-20 DIAGNOSIS — N201 Calculus of ureter: Secondary | ICD-10-CM | POA: Insufficient documentation

## 2020-02-20 DIAGNOSIS — G4730 Sleep apnea, unspecified: Secondary | ICD-10-CM | POA: Diagnosis not present

## 2020-02-20 DIAGNOSIS — I272 Pulmonary hypertension, unspecified: Secondary | ICD-10-CM | POA: Insufficient documentation

## 2020-02-20 DIAGNOSIS — D860 Sarcoidosis of lung: Secondary | ICD-10-CM | POA: Diagnosis not present

## 2020-02-20 DIAGNOSIS — J45909 Unspecified asthma, uncomplicated: Secondary | ICD-10-CM | POA: Insufficient documentation

## 2020-02-20 DIAGNOSIS — F329 Major depressive disorder, single episode, unspecified: Secondary | ICD-10-CM | POA: Diagnosis not present

## 2020-02-20 DIAGNOSIS — I129 Hypertensive chronic kidney disease with stage 1 through stage 4 chronic kidney disease, or unspecified chronic kidney disease: Secondary | ICD-10-CM | POA: Diagnosis not present

## 2020-02-20 DIAGNOSIS — N200 Calculus of kidney: Secondary | ICD-10-CM

## 2020-02-20 HISTORY — PX: CYSTOSCOPY/URETEROSCOPY/HOLMIUM LASER/STENT PLACEMENT: SHX6546

## 2020-02-20 LAB — POCT I-STAT, CHEM 8
BUN: 17 mg/dL (ref 8–23)
Calcium, Ion: 1.05 mmol/L — ABNORMAL LOW (ref 1.15–1.40)
Chloride: 104 mmol/L (ref 98–111)
Creatinine, Ser: 1 mg/dL (ref 0.44–1.00)
Glucose, Bld: 95 mg/dL (ref 70–99)
HCT: 40 % (ref 36.0–46.0)
Hemoglobin: 13.6 g/dL (ref 12.0–15.0)
Potassium: 3.4 mmol/L — ABNORMAL LOW (ref 3.5–5.1)
Sodium: 142 mmol/L (ref 135–145)
TCO2: 29 mmol/L (ref 22–32)

## 2020-02-20 SURGERY — CYSTOSCOPY/URETEROSCOPY/HOLMIUM LASER/STENT PLACEMENT
Anesthesia: General | Laterality: Left

## 2020-02-20 MED ORDER — EPHEDRINE SULFATE 50 MG/ML IJ SOLN
INTRAMUSCULAR | Status: DC | PRN
  Administered 2020-02-20: 10 mg via INTRAVENOUS

## 2020-02-20 MED ORDER — FENTANYL CITRATE (PF) 100 MCG/2ML IJ SOLN: 25.0000 ug | INTRAMUSCULAR | Status: DC | PRN

## 2020-02-20 MED ORDER — IOPAMIDOL (ISOVUE-200) INJECTION 41%
INTRAVENOUS | Status: DC | PRN
  Administered 2020-02-20: 10 mL via INTRAVENOUS

## 2020-02-20 MED ORDER — OXYBUTYNIN CHLORIDE 5 MG PO TABS
5.0000 mg | ORAL_TABLET | Freq: Three times a day (TID) | ORAL | 0 refills | Status: DC | PRN
Start: 2020-02-20 — End: 2020-03-19

## 2020-02-20 MED ORDER — FENTANYL CITRATE (PF) 100 MCG/2ML IJ SOLN
INTRAMUSCULAR | Status: AC
  Filled 2020-02-20: qty 2

## 2020-02-20 MED ORDER — LACTATED RINGERS IV SOLN: INTRAVENOUS | Status: DC

## 2020-02-20 MED ORDER — MIDAZOLAM HCL 2 MG/2ML IJ SOLN
INTRAMUSCULAR | Status: AC
  Filled 2020-02-20: qty 2

## 2020-02-20 MED ORDER — SUGAMMADEX SODIUM 200 MG/2ML IV SOLN
INTRAVENOUS | Status: DC | PRN
  Administered 2020-02-20: 200 mg via INTRAVENOUS

## 2020-02-20 MED ORDER — MIDAZOLAM HCL 2 MG/2ML IJ SOLN
INTRAMUSCULAR | Status: DC | PRN
  Administered 2020-02-20: 2 mg via INTRAVENOUS

## 2020-02-20 MED ORDER — ONDANSETRON HCL 4 MG/2ML IJ SOLN: 4.0000 mg | Freq: Once | INTRAMUSCULAR | Status: DC | PRN

## 2020-02-20 MED ORDER — OXYCODONE-ACETAMINOPHEN 5-325 MG PO TABS: 1.0000 | ORAL_TABLET | ORAL | 0 refills | Status: DC | PRN

## 2020-02-20 MED ORDER — LIDOCAINE HCL (CARDIAC) PF 100 MG/5ML IV SOSY
PREFILLED_SYRINGE | INTRAVENOUS | Status: DC | PRN
  Administered 2020-02-20: 50 mg via INTRAVENOUS

## 2020-02-20 MED ORDER — CEFAZOLIN SODIUM-DEXTROSE 2-4 GM/100ML-% IV SOLN
2.0000 g | INTRAVENOUS | Status: AC
  Administered 2020-02-20: 2 g via INTRAVENOUS

## 2020-02-20 MED ORDER — PROPOFOL 10 MG/ML IV BOLUS
INTRAVENOUS | Status: DC | PRN
  Administered 2020-02-20: 120 mg via INTRAVENOUS

## 2020-02-20 MED ORDER — ROCURONIUM BROMIDE 100 MG/10ML IV SOLN
INTRAVENOUS | Status: DC | PRN
  Administered 2020-02-20: 40 mg via INTRAVENOUS

## 2020-02-20 MED ORDER — FENTANYL CITRATE (PF) 100 MCG/2ML IJ SOLN
INTRAMUSCULAR | Status: DC | PRN
  Administered 2020-02-20: 50 ug via INTRAVENOUS

## 2020-02-20 MED ORDER — PHENYLEPHRINE HCL (PRESSORS) 10 MG/ML IV SOLN
INTRAVENOUS | Status: DC | PRN
  Administered 2020-02-20 (×3): 100 ug via INTRAVENOUS
  Administered 2020-02-20: 200 ug via INTRAVENOUS
  Administered 2020-02-20 (×2): 100 ug via INTRAVENOUS
  Administered 2020-02-20: 200 ug via INTRAVENOUS

## 2020-02-20 MED ORDER — ONDANSETRON HCL 4 MG/2ML IJ SOLN
INTRAMUSCULAR | Status: AC
  Filled 2020-02-20: qty 2

## 2020-02-20 MED ORDER — ALBUTEROL SULFATE HFA 108 (90 BASE) MCG/ACT IN AERS
INHALATION_SPRAY | RESPIRATORY_TRACT | Status: AC
  Filled 2020-02-20: qty 6.7

## 2020-02-20 MED ORDER — ONDANSETRON HCL 4 MG/2ML IJ SOLN
INTRAMUSCULAR | Status: DC | PRN
  Administered 2020-02-20: 4 mg via INTRAVENOUS

## 2020-02-20 MED ORDER — PROPOFOL 10 MG/ML IV BOLUS
INTRAVENOUS | Status: AC
  Filled 2020-02-20: qty 20

## 2020-02-20 MED ORDER — ESTROGENS, CONJUGATED 0.625 MG/GM VA CREA
TOPICAL_CREAM | VAGINAL | Status: AC
  Filled 2020-02-20: qty 30

## 2020-02-20 MED ORDER — EPHEDRINE 5 MG/ML INJ
INTRAVENOUS | Status: AC
  Filled 2020-02-20: qty 10

## 2020-02-20 MED ORDER — ESTROGENS, CONJUGATED 0.625 MG/GM VA CREA
TOPICAL_CREAM | VAGINAL | Status: DC | PRN
  Administered 2020-02-20: 1 via VAGINAL

## 2020-02-20 MED ORDER — ROCURONIUM BROMIDE 10 MG/ML (PF) SYRINGE
PREFILLED_SYRINGE | INTRAVENOUS | Status: AC
  Filled 2020-02-20: qty 10

## 2020-02-20 MED ORDER — CEFAZOLIN SODIUM-DEXTROSE 2-4 GM/100ML-% IV SOLN
INTRAVENOUS | Status: AC
  Filled 2020-02-20: qty 100

## 2020-02-20 MED ORDER — TAMSULOSIN HCL 0.4 MG PO CAPS
0.4000 mg | ORAL_CAPSULE | Freq: Every day | ORAL | 0 refills | Status: DC
Start: 2020-02-20 — End: 2020-03-17

## 2020-02-20 MED ORDER — ALBUTEROL SULFATE HFA 108 (90 BASE) MCG/ACT IN AERS
INHALATION_SPRAY | RESPIRATORY_TRACT | Status: DC | PRN
  Administered 2020-02-20: 5 via RESPIRATORY_TRACT

## 2020-02-20 SURGICAL SUPPLY — 29 items
ADHESIVE MASTISOL STRL (MISCELLANEOUS) ×2 IMPLANT
BAG DRAIN CYSTO-URO LG1000N (MISCELLANEOUS) ×2 IMPLANT
BASKET ZERO TIP 1.9FR (BASKET) ×2 IMPLANT
BRUSH SCRUB EZ 1% IODOPHOR (MISCELLANEOUS) ×2 IMPLANT
CATH URETL 5X70 OPEN END (CATHETERS) ×2 IMPLANT
CNTNR SPEC 2.5X3XGRAD LEK (MISCELLANEOUS) ×1
CONT SPEC 4OZ STER OR WHT (MISCELLANEOUS) ×1
CONTAINER SPEC 2.5X3XGRAD LEK (MISCELLANEOUS) ×1 IMPLANT
DRAPE UTILITY 15X26 TOWEL STRL (DRAPES) ×2 IMPLANT
DRSG TEGADERM 2-3/8X2-3/4 SM (GAUZE/BANDAGES/DRESSINGS) ×2 IMPLANT
FIBER LASER TRACTIP 200 (UROLOGICAL SUPPLIES) ×2 IMPLANT
FORCEPS BIOP PIRANHA Y (CUTTING FORCEPS) ×2 IMPLANT
GLOVE BIO SURGEON STRL SZ 6.5 (GLOVE) ×2 IMPLANT
GOWN STRL REUS W/ TWL LRG LVL3 (GOWN DISPOSABLE) ×2 IMPLANT
GOWN STRL REUS W/TWL LRG LVL3 (GOWN DISPOSABLE) ×2
GUIDEWIRE GREEN .038 145CM (MISCELLANEOUS) IMPLANT
GUIDEWIRE STR DUAL SENSOR (WIRE) ×2 IMPLANT
INFUSOR MANOMETER BAG 3000ML (MISCELLANEOUS) ×2 IMPLANT
INTRODUCER DILATOR DOUBLE (INTRODUCER) IMPLANT
KIT TURNOVER CYSTO (KITS) ×2 IMPLANT
PACK CYSTO AR (MISCELLANEOUS) ×2 IMPLANT
SET CYSTO W/LG BORE CLAMP LF (SET/KITS/TRAYS/PACK) ×2 IMPLANT
SHEATH URETERAL 12FRX35CM (MISCELLANEOUS) IMPLANT
SOL .9 NS 3000ML IRR  AL (IV SOLUTION) ×1
SOL .9 NS 3000ML IRR UROMATIC (IV SOLUTION) ×1 IMPLANT
STENT URET 6FRX24 CONTOUR (STENTS) ×2 IMPLANT
STENT URET 6FRX26 CONTOUR (STENTS) IMPLANT
SURGILUBE 2OZ TUBE FLIPTOP (MISCELLANEOUS) ×2 IMPLANT
WATER STERILE IRR 1000ML POUR (IV SOLUTION) ×2 IMPLANT

## 2020-02-20 NOTE — Op Note (Signed)
Date of procedure: 02/20/20  Preoperative diagnosis:  1. Left ureteral calculus 2. Microscopic hematuria  Postoperative diagnosis:  1. Same as above 2. Urethral caruncle  Procedure: 1. Cystoscopy 2. Left ureteroscopy with lithotripsy 3. Basket extraction of stone fragment 4. Retrograde pyelogram, left 5. Ureteral stent placement  Surgeon: Hollice Espy, MD  Anesthesia: General  Complications: None  Intraoperative findings: Approximately 5 mm stone identified within the distal ureter with a transition point noted on retrograde pyelogram at this location.  Significant ureteral edema at this level consistent with chronic stone.  Laser malfunction, able to fragment the stone using Parona forceps.  All fragments removed.  Stent replaced.  Large friable urethral caruncle appreciated 6 o'clock position on urethra.  EBL: Minimal  Specimens: Stone fragment  Drains: 6 x 24 French double-J ureteral stent on left, tether in place  Indication: Stacie [name] is a 64 y.o. patient with microscopic hematuria and various bladder complaints including urgency frequency found to have an obstructing left ureteral calculus on CT urogram.  After reviewing the management options for treatment, she elected to proceed with the above surgical procedure(s). We have discussed the potential benefits and risks of the procedure, side effects of the proposed treatment, the likelihood of the patient achieving the goals of the procedure, and any potential problems that might occur during the procedure or recuperation. Informed consent has been obtained.  Description of procedure:  The patient was taken to the operating room and general anesthesia was induced.  The patient was placed in the dorsal lithotomy position, prepped and draped in the usual sterile fashion, and preoperative antibiotics were administered. A preoperative time-out was performed.   A 21 French scope was advanced per urethra into the  bladder.  Notably, the patient had a fairly large, approximately half centimeter urethral caruncle at the 6 o'clock position which was fairly friable.  The bladder is carefully inspected and noted to be free of any tumors, lesions, or ulcerations.  Attention was turned to the left ureteral orifice which was cannulated using a 5 [place] open-ended ureteral catheter.  Gentle retrograde pyelogram was performed on the side which revealed a decompressed distal ureter several centimeters in length and there was an abrupt transition point within the distal ureter at the known location of the stone with proximal dilation of the ureter at this level.  A sensor wire is in place up to level the kidney without difficulty.  This was snapped in place as a safety wire.  A 5 French semirigid ureteroscope was advanced through the ureter up to the level of the transition point where fairly significant amount of ureteral edema and friable mucosa was appreciated.  Just proximal to this, the stone was encountered which had a almost barbell-like shape.  A 200 m laser fiber was then brought in but unfortunately, due to malfunction of the paddle, we are not able to use this.  Ultimately was able to use Piranha forceps to crush the stone into about 4 5 pieces.  A 1.9 French tipless nitinol basket was used to extract these through these pieces.  One was sent as a specimen for stone analysis.  The scope was then passed up to level the proximal ureter no additional stone fragments were appreciated.  Finally, 6 x 24 French double-J ureteral stent was advanced over the wire up to level the proximal ureter.  The wires partially drawn till full coils noted both within the renal pelvis as well as within the bladder.  The bladder was  then drained.  The stent string was left affixed to the distal coil of the stent which was taped to the patient's left inner thigh using Mastisol and Tegaderm.  She was then clean and dry.  She was noted to be oozing  slightly from the urethral caruncle secondary to manipulation.  A small amount of Premarin cream was applied which stopped the bleeding.  She was then repositioned in supine position reversed from anesthesia.  She was taken to the PACU in stable condition.  Plan: She will remove her own stent on Monday.  She will follow-up in 4 weeks with a renal ultrasound prior.  She was advised to use Premarin cream at least 3 times a week per urethral meatus for the large caruncle which we will recheck at her follow-up.  Hollice Espy, M.D.

## 2020-02-20 NOTE — Discharge Instructions (Signed)
You have a ureteral stent in place.  This is a tube that extends from your kidney to your bladder.  This may cause urinary bleeding, burning with urination, and urinary frequency.  Please call our office or present to the ED if you develop fevers >101 or pain which is not able to be controlled with oral pain medications.  You may be given either Flomax and/ or ditropan to help with bladder spasms and stent pain in addition to pain medications.    Your stent is on a string taped to your left inner thigh.  On Monday, you may untape this and pull gently.  Please avoid trying to pull this out prematurely when bathing and toileting.  You also have a fairly large urethral caruncle which is a benign growth on your urethra.  There was some bleeding from this with manipulation of the cystoscope.  You would benefit from using topical estrogen cream which was provided to you.  I recommend using a pea-sized amount to place it directly on your urethral meatus at nighttime before bed on Monday, Wednesdays, and Fridays.  Wash your hands really well before and after.  No sexual intercourse after applying this medication.  [place] [address], [place], [place] [address] [phone]  AMBULATORY SURGERY  DISCHARGE INSTRUCTIONS   1) The drugs that you were given will stay in your system until tomorrow so for the next 24 hours you should not:  A) Drive an automobile B) Make any legal decisions C) Drink any alcoholic beverage   2) You may resume regular meals tomorrow.  Today it is better to start with liquids and gradually work up to solid foods.  You may eat anything you prefer, but it is better to start with liquids, then soup and crackers, and gradually work up to solid foods.   3) Please notify your doctor immediately if you have any unusual bleeding, trouble breathing, redness and pain at the surgery site, drainage, fever, or pain not relieved by  medication.    4) Additional Instructions:        Please contact your physician with any problems or Same Day Surgery at [phone], Monday through Friday 6 am to 4 pm, or [place] at [facility] number at [phone].

## 2020-02-20 NOTE — Transfer of Care (Signed)
Immediate Anesthesia Transfer of Care Note  Patient: Stacie [name]  Procedure(s) Performed: CYSTOSCOPY/URETEROSCOPY/LITHOTRIPSY /STENT PLACEMENT (Left )  Patient Location: PACU  Anesthesia Type:General  Level of Consciousness: awake  Airway & Oxygen Therapy: Patient connected to face mask oxygen  Post-op Assessment: Post -op Vital signs reviewed and stable  Post vital signs: stable  Last Vitals:  Vitals Value Taken Time  BP 148/72 02/20/20 1358  Temp 36.3 C 02/20/20 1358  Pulse 107 02/20/20 1407  Resp 21 02/20/20 1407  SpO2 98 % 02/20/20 1407  Vitals shown include unvalidated device data.  Last Pain:  Vitals:   02/20/20 1358  TempSrc:   PainSc: 0-No pain         Complications: No apparent anesthesia complications

## 2020-02-20 NOTE — Interval H&P Note (Signed)
History and Physical Interval Note:  02/20/2020 12:22 PM  Stacie [name]  has presented today for surgery, with the diagnosis of left ureteral stone.  The various methods of treatment have been discussed with the patient and family. After consideration of risks, benefits and other options for treatment, the patient has consented to  Procedure(s): CYSTOSCOPY/URETEROSCOPY/HOLMIUM LASER/STENT PLACEMENT (Left) as a surgical intervention.  The patient's history has been reviewed, patient examined, no change in status, stable for surgery.  I have reviewed the patient's chart and labs.  Questions were answered to the patient's satisfaction.    RRR CTAB   Hollice Espy

## 2020-02-20 NOTE — Anesthesia Preprocedure Evaluation (Signed)
Anesthesia Evaluation  Patient identified by MRN, date of birth, ID band Patient awake    Reviewed: Allergy & Precautions, H&P , NPO status , Patient's Chart, lab work & pertinent test results  Airway Mallampati: III  TM Distance: >3 FB Neck ROM: full    Dental  (+) Teeth Intact   Pulmonary shortness of breath, asthma , sleep apnea, Continuous Positive Airway Pressure Ventilation and Oxygen sleep apnea ,  Pulmonary sarcoidosis with pulmonary hypertension on treprostinil    + decreased breath sounds      Cardiovascular negative cardio ROS Normal cardiovascular exam     Neuro/Psych PSYCHIATRIC DISORDERS Depression negative neurological ROS     GI/Hepatic Neg liver ROS, GERD  Controlled,  Endo/Other  Hypothyroidism   Renal/GU Renal disease (CRI)nephrolithiasis     Musculoskeletal   Abdominal   Peds  Hematology negative hematology ROS (+)   Anesthesia Other Findings Past Medical History: No date: Arrhythmia     Comment:  patient unaware if this is current No date: Asthma No date: GERD (gastroesophageal reflux disease) No date: Heart murmur No date: History of kidney stones No date: HOH (hard of hearing)     Comment:  wear aids No date: Hypothyroidism No date: IBS (irritable bowel syndrome) No date: Pulmonary hypertension (HCC) No date: Sarcoid No date: Sarcoidosis No date: Seasonal allergies CPAP with O2: Sleep apnea No date: Wears hearing aid in both ears  Past Surgical History: 10/18/2018: CARDIAC CATHETERIZATION     Comment:  [facility] 07/31/2019: CATARACT EXTRACTION W/PHACO; Left     Comment:  Procedure: CATARACT EXTRACTION PHACO AND INTRAOCULAR               LENS PLACEMENT (IOC) LEFT 00:51.1  17.9%  9.15;  Surgeon:              Leandrew Koyanagi, MD;  Location: [place];              Service: Ophthalmology;  Laterality: Left;  keep this               patient second No date: COLON SURGERY      Comment:  "colon was fused to bladder - operated on both" 09/18/2007: COLONOSCOPY     Comment:  diverticuli, no polyps 05/26/2010: COLONOSCOPY     Comment:  diverticuli, no polyps 05/20/2015: PARS PLANA VITRECTOMY; Right     Comment:  Procedure: PARS PLANA VITRECTOMY WITH 25 GAUGE, laser;                Surgeon: Milus Height, MD;  Location: [facility];                Service: Ophthalmology;  Laterality: Right; 1990: PARTIAL HYSTERECTOMY No date: TUBAL LIGATION  BMI    Body Mass Index: 28.89 kg/m      Reproductive/Obstetrics negative OB ROS                             Anesthesia Physical Anesthesia Plan  ASA: III  Anesthesia Plan: General ETT   Post-op Pain Management:    Induction:   PONV Risk Score and Plan: Ondansetron, Dexamethasone and Treatment may vary due to age or medical condition  Airway Management Planned:   Additional Equipment:   Intra-op Plan:   Post-operative Plan:   Informed Consent: I have reviewed the patients History and Physical, chart, labs and discussed the procedure including the risks, benefits and alternatives for the proposed anesthesia with  the patient or authorized representative who has indicated his/her understanding and acceptance.     Dental Advisory Given  Plan Discussed with: Anesthesiologist  Anesthesia Plan Comments:         Anesthesia Quick Evaluation

## 2020-02-20 NOTE — Anesthesia Procedure Notes (Signed)
Procedure Name: Intubation Date/Time: 02/20/2020 1:09 PM Performed by: Aline Brochure, CRNA Pre-anesthesia Checklist: Patient identified, Emergency Drugs available, Suction available and Patient being monitored Patient Re-evaluated:Patient Re-evaluated prior to induction Oxygen Delivery Method: Circle system utilized Preoxygenation: Pre-oxygenation with 100% oxygen Induction Type: IV induction Ventilation: Mask ventilation without difficulty Laryngoscope Size: McGraph and 3 Grade View: Grade I Tube type: Oral Tube size: 7.0 mm Number of attempts: 1 Airway Equipment and Method: Stylet and Video-laryngoscopy Placement Confirmation: ETT inserted through vocal cords under direct vision,  positive ETCO2 and breath sounds checked- equal and bilateral Secured at: 21 cm Tube secured with: Tape Dental Injury: Teeth and Oropharynx as per pre-operative assessment

## 2020-02-21 NOTE — Anesthesia Postprocedure Evaluation (Signed)
Anesthesia Post Note  Patient: Stacie [name]  Procedure(s) Performed: CYSTOSCOPY/URETEROSCOPY/LITHOTRIPSY /STENT PLACEMENT (Left )  Patient location during evaluation: PACU Anesthesia Type: General Level of consciousness: awake and alert Pain management: pain level controlled Vital Signs Assessment: post-procedure vital signs reviewed and stable Respiratory status: spontaneous breathing, nonlabored ventilation and respiratory function stable Cardiovascular status: blood pressure returned to baseline and stable Postop Assessment: no apparent nausea or vomiting Anesthetic complications: no     Last Vitals:  Vitals:   02/20/20 1428 02/20/20 1441  BP: (!) 129/57 (!) 155/69  Pulse: 98   Resp: 15 18  Temp: 37 C   SpO2: 96% 91%    Last Pain:  Vitals:   02/21/20 0848  TempSrc:   PainSc: [place]

## 2020-02-22 DIAGNOSIS — G4733 Obstructive sleep apnea (adult) (pediatric): Secondary | ICD-10-CM | POA: Diagnosis not present

## 2020-02-27 LAB — CALCULI, WITH PHOTOGRAPH (CLINICAL LAB)
Calcium Oxalate Dihydrate: 40 %
Calcium Oxalate Monohydrate: 60 %
Weight Calculi: 6 mg

## 2020-03-05 ENCOUNTER — Other Ambulatory Visit: Payer: Self-pay | Admitting: Internal Medicine

## 2020-03-05 DIAGNOSIS — F3341 Major depressive disorder, recurrent, in partial remission: Secondary | ICD-10-CM

## 2020-03-05 MED ORDER — ESCITALOPRAM OXALATE 20 MG PO TABS: 20.0000 mg | ORAL_TABLET | Freq: Every day | ORAL | 1 refills | Status: DC

## 2020-03-05 NOTE — Telephone Encounter (Signed)
Approved per protocol. . Requested Prescriptions  Pending Prescriptions Disp Refills  . escitalopram (LEXAPRO) 20 MG tablet 90 tablet 3    Sig: Take 1 tablet (20 mg total) by mouth daily.     Psychiatry:  Antidepressants - SSRI Passed - 03/05/2020  4:05 PM      Passed - Completed PHQ-2 or PHQ-9 in the last 360 days.      Passed - Valid encounter within last 6 months    Recent Outpatient Visits          1 month ago Acute cystitis without hematuria   [facility] Glean Hess, MD   4 months ago Acute cystitis with hematuria   [facility] Glean Hess, MD   9 months ago Annual physical exam   [facility] Glean Hess, MD   10 months ago Essential hypertension   [facility] Glean Hess, MD   1 year ago Depression, major, recurrent, in partial remission [facility])   [facility] Glean Hess, MD      Future Appointments            In 2 weeks Hollice Espy, MD [place]   In 2 months Army Melia Jesse Sans, MD [facility], [facility]

## 2020-03-05 NOTE — Telephone Encounter (Signed)
Patient needs a refill on escitalopram (LEXAPRO) 20 MG tablet. Patient does have an upcoming appointment.

## 2020-03-06 DIAGNOSIS — Z03818 Encounter for observation for suspected exposure to other biological agents ruled out: Secondary | ICD-10-CM | POA: Diagnosis not present

## 2020-03-06 DIAGNOSIS — M359 Systemic involvement of connective tissue, unspecified: Secondary | ICD-10-CM | POA: Diagnosis not present

## 2020-03-06 DIAGNOSIS — Z20822 Contact with and (suspected) exposure to covid-19: Secondary | ICD-10-CM | POA: Diagnosis not present

## 2020-03-06 DIAGNOSIS — I2721 Secondary pulmonary arterial hypertension: Secondary | ICD-10-CM | POA: Diagnosis not present

## 2020-03-09 DIAGNOSIS — Z79899 Other long term (current) drug therapy: Secondary | ICD-10-CM | POA: Diagnosis not present

## 2020-03-09 DIAGNOSIS — D869 Sarcoidosis, unspecified: Secondary | ICD-10-CM | POA: Diagnosis not present

## 2020-03-09 DIAGNOSIS — D860 Sarcoidosis of lung: Secondary | ICD-10-CM | POA: Diagnosis not present

## 2020-03-09 DIAGNOSIS — Z7952 Long term (current) use of systemic steroids: Secondary | ICD-10-CM | POA: Diagnosis not present

## 2020-03-09 DIAGNOSIS — Z9981 Dependence on supplemental oxygen: Secondary | ICD-10-CM | POA: Diagnosis not present

## 2020-03-09 DIAGNOSIS — I270 Primary pulmonary hypertension: Secondary | ICD-10-CM | POA: Diagnosis not present

## 2020-03-09 DIAGNOSIS — E669 Obesity, unspecified: Secondary | ICD-10-CM | POA: Diagnosis not present

## 2020-03-09 DIAGNOSIS — Z6830 Body mass index (BMI) 30.0-30.9, adult: Secondary | ICD-10-CM | POA: Diagnosis not present

## 2020-03-09 DIAGNOSIS — R0689 Other abnormalities of breathing: Secondary | ICD-10-CM | POA: Diagnosis not present

## 2020-03-09 DIAGNOSIS — R0602 Shortness of breath: Secondary | ICD-10-CM | POA: Diagnosis not present

## 2020-03-11 DIAGNOSIS — G4733 Obstructive sleep apnea (adult) (pediatric): Secondary | ICD-10-CM | POA: Diagnosis not present

## 2020-03-12 DIAGNOSIS — M810 Age-related osteoporosis without current pathological fracture: Secondary | ICD-10-CM | POA: Diagnosis not present

## 2020-03-12 DIAGNOSIS — G4733 Obstructive sleep apnea (adult) (pediatric): Secondary | ICD-10-CM | POA: Diagnosis not present

## 2020-03-13 ENCOUNTER — Other Ambulatory Visit: Payer: Self-pay | Admitting: Urology

## 2020-03-13 ENCOUNTER — Other Ambulatory Visit: Payer: Self-pay

## 2020-03-13 ENCOUNTER — Ambulatory Visit
Admission: RE | Admit: 2020-03-13 | Discharge: 2020-03-13 | Disposition: A | Discharge status: Home / Self Care | Payer: BC Managed Care – PPO | Source: Ambulatory Visit | Attending: Urology | Admitting: Urology

## 2020-03-13 DIAGNOSIS — N200 Calculus of kidney: Secondary | ICD-10-CM

## 2020-03-13 DIAGNOSIS — G4733 Obstructive sleep apnea (adult) (pediatric): Secondary | ICD-10-CM | POA: Diagnosis not present

## 2020-03-13 DIAGNOSIS — N201 Calculus of ureter: Secondary | ICD-10-CM | POA: Diagnosis not present

## 2020-03-18 NOTE — Progress Notes (Signed)
03/19/20 5:32 PM   Stacie [name] Apr 04, 1956 [id number]  Referring provider: Glean Hess, MD [address] [place],   [address] Chief Complaint  Patient presents with  . Nephrolithiasis    HPI: Stacie [name] is a 64 y.o. F w/ recurrent cystitis and microscopic hematuria returns today s/p ureteroscopy w/ lithotripsy.    Her CT from 02/11/20 revealed a 4 mm calculus in the distal third of the left ureter w/ no hydronephrosis. Additional small nonobstructive bilateral renal calculi noted.   She underwent ureteroscopy w/ lithotripsy on 02/20/20. F/u RUS from 03/13/20 revealed slight fullness of the central left renal collecting system without overt hydronephrosis, improved from prior CT. No renal stones large enough to cause acoustic shadowing.  Stone composed of 60% calcium oxalate monohydrate and 40% calcium oxalate dihydrate.   No GU complaints today. She is doing fine post-op.   Preoperatively, she was having urgency frequency and recurrent infections.  All of these symptoms have resolved presumably related to stones.  She is very pleased with results.  +Urine culture 01/07/20 indicative of Raoultella planticola resistant to Ampicillin  PMH: Past Medical History:  Diagnosis Date  . Arrhythmia    patient unaware if this is current  . Asthma   . GERD (gastroesophageal reflux disease)   . Heart murmur   . History of kidney stones   . HOH (hard of hearing)    wear aids  . Hypothyroidism   . IBS (irritable bowel syndrome)   . Pulmonary hypertension ([place])   . Sarcoid   . Sarcoidosis   . Seasonal allergies   . Sleep apnea CPAP with O2  . Wears hearing aid in both ears     Surgical History: Past Surgical History:  Procedure Laterality Date  . CARDIAC CATHETERIZATION  10/18/2018   [facility]  . CATARACT EXTRACTION W/PHACO Left 07/31/2019   Procedure: CATARACT EXTRACTION PHACO AND INTRAOCULAR LENS PLACEMENT (IOC) LEFT 00:51.1  17.9%  9.15;  Surgeon:  Leandrew Koyanagi, MD;  Location: [place];  Service: Ophthalmology;  Laterality: Left;  keep this patient second  . COLON SURGERY     "colon was fused to bladder - operated on both"  . COLONOSCOPY  09/18/2007   diverticuli, no polyps  . COLONOSCOPY  05/26/2010   diverticuli, no polyps  . CYSTOSCOPY/URETEROSCOPY/HOLMIUM LASER/STENT PLACEMENT Left 02/20/2020   Procedure: CYSTOSCOPY/URETEROSCOPY/LITHOTRIPSY /STENT PLACEMENT;  Surgeon: Hollice Espy, MD;  Location: [facility];  Service: Urology;  Laterality: Left;  . PARS PLANA VITRECTOMY Right 05/20/2015   Procedure: PARS PLANA VITRECTOMY WITH 25 GAUGE, laser;  Surgeon: Milus Height, MD;  Location: [facility];  Service: Ophthalmology;  Laterality: Right;  . PARTIAL HYSTERECTOMY  1990  . TUBAL LIGATION      Home Medications:  Allergies as of 03/19/2020      Reactions   Corn-containing Products Diarrhea   headaches   Nitrofurantoin Nausea Only   Tramadol    Nausea and vomiting   Sulfa Antibiotics Rash   As an infant      Medication List       Accurate as of March 19, 2020 11:59 PM. If you have any questions, ask your nurse or doctor.        STOP taking these medications   escitalopram 20 MG tablet Commonly known as: LEXAPRO Stopped by: Hollice Espy, MD   oxybutynin 5 MG tablet Commonly known as: DITROPAN Stopped by: Hollice Espy, MD   oxyCODONE-acetaminophen 5-325 MG tablet Commonly known as: Percocet Stopped  by: Hollice Espy, MD   tamsulosin 0.4 MG Caps capsule Commonly known as: FLOMAX Stopped by: Hollice Espy, MD     TAKE these medications   albuterol 108 (90 Base) MCG/ACT inhaler Commonly known as: Ventolin HFA Inhale 2 puffs into the lungs every 6 (six) hours as needed for wheezing.   brimonidine 0.2 % ophthalmic solution Commonly known as: ALPHAGAN Place 1 drop into both eyes 3 (three) times daily.   budesonide-formoterol 80-4.5 MCG/ACT inhaler Commonly known as: Symbicort Take 2 puffs  first thing in am and then another 2 puffs about 12 hours later. What changed:   how much to take  how to take this  when to take this   calcium carbonate 500 MG chewable tablet Commonly known as: TUMS - dosed in mg elemental calcium Chew 1 tablet by mouth daily as needed for indigestion or heartburn.   denosumab 60 MG/ML Sosy injection Commonly known as: PROLIA Inject 60 mg into the skin every 6 (six) months.   dorzolamide 2 % ophthalmic solution Commonly known as: TRUSOPT Place 1 drop into both eyes 2 (two) times daily.   Flutter Devi Use as directed   folic acid 1 MG tablet Commonly known as: FOLVITE Take 1 mg by mouth daily.   latanoprost 0.005 % ophthalmic solution Commonly known as: XALATAN Place 1 drop into the right eye at bedtime.   methotrexate 2.5 MG tablet Commonly known as: RHEUMATREX Take by mouth. What changed: Another medication with the same name was removed. Continue taking this medication, and follow the directions you see here. Changed by: Hollice Espy, MD   multivitamin with minerals tablet Take 1 tablet by mouth daily.   NON FORMULARY CPAP nightly   OXYGEN Inhale 2 L into the lungs as needed.   predniSONE 2.5 MG tablet Commonly known as: DELTASONE Take 12.5 mg by mouth daily with breakfast. 12.5 mg   Synthroid 137 MCG tablet Generic drug: levothyroxine TAKE 1 TABLET (137 MCG TOTAL) BY MOUTH DAILY BEFORE BREAKFAST. What changed: See the new instructions.   Treprostinil 0.6 MG/ML Soln Commonly known as: TYVASO Inhale 18 mcg into the lungs 4 (four) times daily. 12 breaths 4 times a day   Vitamin D (Ergocalciferol) 1.25 MG (50000 UNIT) Caps capsule Commonly known as: DRISDOL Take 50,000 Units by mouth once a week.   VITAMIN D PO Take 1 tablet by mouth once a week.       Allergies:  Allergies  Allergen Reactions  . Corn-Containing Products Diarrhea    headaches  . Nitrofurantoin Nausea Only  . Tramadol     Nausea and  vomiting  . Sulfa Antibiotics Rash    As an infant    Family History: Family History  Problem Relation Age of Onset  . Allergies Father   . Asthma Father   . Colon cancer Father   . Allergies Brother   . Asthma Brother   . Breast cancer Maternal Grandmother     Social History:  reports that she has never smoked. She has never used smokeless tobacco. She reports that she does not drink alcohol or use drugs.   Physical Exam: BP (!) 159/91   Pulse 97   Ht _0  (1.702 m)   Wt 191 lb (86.6 kg)   BMI 29.91 kg/m   Constitutional:  Alert and oriented, No acute distress. HEENT: [place] AT, moist mucus membranes.  Trachea midline, no masses. Cardiovascular: No clubbing, cyanosis, or edema. Respiratory: Normal respiratory effort, no increased work of  breathing. Skin: No rashes, bruises or suspicious lesions. Neurologic: Grossly intact, no focal deficits, moving all 4 extremities. Psychiatric: Normal mood and affect.  Laboratory Data:  Lab Results  Component Value Date   CREATININE 1.00 02/20/2020   Pertinent Imaging:  Results for orders placed during the hospital encounter of 03/13/20  Ultrasound renal complete   Narrative CLINICAL DATA:  Follow-up left ureteral stone  EXAM: RENAL / URINARY TRACT ULTRASOUND COMPLETE  COMPARISON:  02/11/2020 CT abdomen/pelvis  FINDINGS: Right Kidney:  Renal measurements: 9.7 x 4.5 x 5.6 cm = volume: 129 mL . Echogenicity within normal limits. No mass or hydronephrosis visualized. No renal stones large enough to cause acoustic shadowing.  Left Kidney:  Renal measurements: 9.7 x 5.1 x 3.8 cm = volume: 97 mL. Echogenicity within normal limits. No mass. Slight fullness of the central left renal collecting system without overt hydronephrosis, improved from prior CT. No renal stones large enough to cause acoustic shadowing.  Bladder:  Appears normal for degree of bladder distention.  Other:  None.  IMPRESSION: Slight fullness of  the central left renal collecting system without overt hydronephrosis, improved from prior CT. No renal stones large enough to cause acoustic shadowing.   Electronically Signed   By: Ilona Sorrel M.D.   On: 03/14/2020 09:35    I have personally reviewed the images and agree with radiologist interpretation.   Assessment & Plan:    1. Nephrolithiasis  S/p ureteroscopy/ lithotripsy  RUS unremarkable  Stone composed of calcium oxalate Okay to stop Flomax  We discussed general stone prevention techniques including drinking plenty water with goal of producing 2.5 L urine daily, increased citric acid intake, avoidance of high oxalate containing foods, and decreased salt intake.  Information about dietary recommendations given today.  F/u prn  Return if symptoms worsen or fail to improve.  [facility] [address], [place], [place] [address] [phone]  I, Lucas Mallow, am acting as a scribe for Dr. Hollice Espy,  I have reviewed the above documentation for accuracy and completeness, and I agree with the above.   Hollice Espy, MD

## 2020-03-19 ENCOUNTER — Other Ambulatory Visit: Payer: Self-pay

## 2020-03-19 ENCOUNTER — Ambulatory Visit (INDEPENDENT_AMBULATORY_CARE_PROVIDER_SITE_OTHER): Payer: BC Managed Care – PPO | Admitting: Urology

## 2020-03-19 VITALS — BP 159/91 | HR 97 | Ht 67.0 in | Wt 191.0 lb

## 2020-03-19 DIAGNOSIS — N200 Calculus of kidney: Secondary | ICD-10-CM

## 2020-03-24 ENCOUNTER — Other Ambulatory Visit: Payer: Self-pay

## 2020-03-24 ENCOUNTER — Encounter: Payer: BC Managed Care – PPO | Attending: Internal Medicine

## 2020-03-24 DIAGNOSIS — D860 Sarcoidosis of lung: Secondary | ICD-10-CM

## 2020-03-24 DIAGNOSIS — G4733 Obstructive sleep apnea (adult) (pediatric): Secondary | ICD-10-CM | POA: Diagnosis not present

## 2020-03-24 NOTE — Progress Notes (Signed)
Virtual Visit completed. Patient informed on EP and RD appointment and 6 Minute walk test. Patient also informed of patient health questionnaires on My Chart. Patient Verbalizes understanding. Visit diagnosis can be found in [facility] 03/09/2020.

## 2020-03-26 DIAGNOSIS — Z03818 Encounter for observation for suspected exposure to other biological agents ruled out: Secondary | ICD-10-CM | POA: Diagnosis not present

## 2020-03-26 DIAGNOSIS — Z20822 Contact with and (suspected) exposure to covid-19: Secondary | ICD-10-CM | POA: Diagnosis not present

## 2020-04-02 ENCOUNTER — Ambulatory Visit: Payer: BC Managed Care – PPO

## 2020-04-03 DIAGNOSIS — I2721 Secondary pulmonary arterial hypertension: Secondary | ICD-10-CM | POA: Diagnosis not present

## 2020-04-03 DIAGNOSIS — M359 Systemic involvement of connective tissue, unspecified: Secondary | ICD-10-CM | POA: Diagnosis not present

## 2020-04-04 ENCOUNTER — Other Ambulatory Visit: Payer: Self-pay | Admitting: Internal Medicine

## 2020-04-06 NOTE — Progress Notes (Signed)
04/07/20 11:29 AM   Stacie [name] [birth year] [id number]  Referring provider: Glean Hess, MD [address] [place],  [place] [address] Chief Complaint  Patient presents with  . Urinary Frequency    HPI: Stacie [name] is a 64 y.o. F w/ recurrent cystitis and microscopic hematuria s/p ureteroscopy w/ lithotripsy returns today for the evaluation and management of bladder pain/ discomfort.   Her CT from 02/11/20 revealed a 4 mm calculus in the distal third of the left ureter w/ no hydronephrosis. Additional small nonobstructive bilateral renal calculi noted.  She underwent ureteroscopy w/ lithotripsy on 02/20/20. F/u RUS from 03/13/20 revealed slight fullness of the central left renal collecting system without overt hydronephrosis, improved from prior CT. No renal stones large enough to cause acoustic shadowing.  Stone composed of 60% calcium oxalate monohydrate and 40% calcium oxalate dihydrate.   She reports of different bothersome rUTI symptoms from her previous infections. She denies burning w/ urination however has pressure when bladder is full. Nocturia q 3 hrs. She denies fever.   +Urine culture 01/07/20 indicative of Raoultella planticola resistant to Ampicillin  PMH: Past Medical History:  Diagnosis Date  . Arrhythmia    patient unaware if this is current  . Asthma   . GERD (gastroesophageal reflux disease)   . Heart murmur   . History of kidney stones   . HOH (hard of hearing)    wear aids  . Hypothyroidism   . IBS (irritable bowel syndrome)   . Pulmonary hypertension ([place])   . Sarcoid   . Sarcoidosis   . Seasonal allergies   . Sleep apnea CPAP with O2  . Wears hearing aid in both ears     Surgical History: Past Surgical History:  Procedure Laterality Date  . CARDIAC CATHETERIZATION  10/18/2018   [facility]  . CATARACT EXTRACTION W/PHACO Left 07/31/2019   Procedure: CATARACT EXTRACTION PHACO AND INTRAOCULAR LENS PLACEMENT (IOC) LEFT 00:51.1   17.9%  9.15;  Surgeon: Leandrew Koyanagi, MD;  Location: [place];  Service: Ophthalmology;  Laterality: Left;  keep this patient second  . COLON SURGERY     "colon was fused to bladder - operated on both"  . COLONOSCOPY  09/18/2007   diverticuli, no polyps  . COLONOSCOPY  05/26/2010   diverticuli, no polyps  . CYSTOSCOPY/URETEROSCOPY/HOLMIUM LASER/STENT PLACEMENT Left 02/20/2020   Procedure: CYSTOSCOPY/URETEROSCOPY/LITHOTRIPSY /STENT PLACEMENT;  Surgeon: Hollice Espy, MD;  Location: [facility];  Service: Urology;  Laterality: Left;  . PARS PLANA VITRECTOMY Right 05/20/2015   Procedure: PARS PLANA VITRECTOMY WITH 25 GAUGE, laser;  Surgeon: Milus Height, MD;  Location: [facility];  Service: Ophthalmology;  Laterality: Right;  . PARTIAL HYSTERECTOMY  1990  . TUBAL LIGATION      Home Medications:  Allergies as of 04/07/2020      Reactions   Corn-containing Products Diarrhea   headaches   Nitrofurantoin Nausea Only   Tramadol    Nausea and vomiting   Sulfa Antibiotics Rash   As an infant      Medication List       Accurate as of April 07, 2020 11:29 AM. If you have any questions, ask your nurse or doctor.        albuterol 108 (90 Base) MCG/ACT inhaler Commonly known as: Ventolin HFA Inhale 2 puffs into the lungs every 6 (six) hours as needed for wheezing.   brimonidine 0.2 % ophthalmic solution Commonly known as: ALPHAGAN Place 1 drop into both eyes 3 (three)  times daily.   budesonide-formoterol 80-4.5 MCG/ACT inhaler Commonly known as: Symbicort Take 2 puffs first thing in am and then another 2 puffs about 12 hours later. What changed:   how much to take  how to take this  when to take this   calcium carbonate 500 MG chewable tablet Commonly known as: TUMS - dosed in mg elemental calcium Chew 1 tablet by mouth daily as needed for indigestion or heartburn.   cephALEXin 500 MG capsule Commonly known as: Keflex Take 1 capsule (500 mg total) by mouth 3  (three) times daily. Started by: Hollice Espy, MD   denosumab 60 MG/ML Sosy injection Commonly known as: PROLIA Inject 60 mg into the skin every 6 (six) months.   dorzolamide 2 % ophthalmic solution Commonly known as: TRUSOPT Place 1 drop into both eyes 2 (two) times daily.   escitalopram 20 MG tablet Commonly known as: LEXAPRO   Flutter Devi Use as directed   folic acid 1 MG tablet Commonly known as: FOLVITE Take 1 mg by mouth daily.   latanoprost 0.005 % ophthalmic solution Commonly known as: XALATAN Place 1 drop into the right eye at bedtime.   Trexall 5 MG tablet Generic drug: methotrexate TAKE 5 TABLETS (25 MG TOTAL) BY MOUTH EVERY 7 (SEVEN) DAYS   methotrexate 2.5 MG tablet Commonly known as: RHEUMATREX Take by mouth.   multivitamin with minerals tablet Take 1 tablet by mouth daily.   NON FORMULARY CPAP nightly   oxybutynin 5 MG tablet Commonly known as: DITROPAN   oxyCODONE-acetaminophen 5-325 MG tablet Commonly known as: PERCOCET/ROXICET   OXYGEN Inhale 2 L into the lungs as needed.   predniSONE 2.5 MG tablet Commonly known as: DELTASONE Take 12.5 mg by mouth daily with breakfast. 12.5 mg   Synthroid 137 MCG tablet Generic drug: levothyroxine TAKE 1 TABLET (137 MCG TOTAL) BY MOUTH DAILY BEFORE BREAKFAST.   tamsulosin 0.4 MG Caps capsule Commonly known as: FLOMAX   Treprostinil 0.6 MG/ML Soln Commonly known as: TYVASO Inhale 18 mcg into the lungs 4 (four) times daily. 12 breaths 4 times a day   Vitamin D (Ergocalciferol) 1.25 MG (50000 UNIT) Caps capsule Commonly known as: DRISDOL Take 50,000 Units by mouth once a week.   VITAMIN D PO Take 1 tablet by mouth once a week.       Allergies:  Allergies  Allergen Reactions  . Corn-Containing Products Diarrhea    headaches  . Nitrofurantoin Nausea Only  . Tramadol     Nausea and vomiting  . Sulfa Antibiotics Rash    As an infant    Family History: Family History  Problem  Relation Age of Onset  . Allergies Father   . Asthma Father   . Colon cancer Father   . Allergies Brother   . Asthma Brother   . Breast cancer Maternal Grandmother     Social History:  reports that she has never smoked. She has never used smokeless tobacco. She reports that she does not drink alcohol and does not use drugs.   Physical Exam: BP (!) 196/61   Pulse (!) 112   Wt 191 lb (86.6 kg)   SpO2 92%   BMI 29.91 kg/m   Constitutional:  Alert and oriented, No acute distress. HEENT: [place] AT, moist mucus membranes.  Trachea midline, no masses. Cardiovascular: No clubbing, cyanosis, or edema. Respiratory: Normal respiratory effort, no increased work of breathing. Skin: No rashes, bruises or suspicious lesions. Neurologic: Grossly intact, no focal deficits, moving all  4 extremities. Psychiatric: Normal mood and affect.  Laboratory Data:  Lab Results  Component Value Date   CREATININE 1.00 02/20/2020   Urinalysis >30 WBC and moderate bacteria    Assessment & Plan:    1. Acute cystitis w/o hematuria   UA today suspicious for infection  Urine culture sent Rx of Keflex sent to pharmacy  Advised pt to send a mychart message if symptoms worsen    [facility] [address], [place], [place] [address] [phone]  I, Lucas Mallow, am acting as a scribe for Dr. Hollice Espy,  I have reviewed the above documentation for accuracy and completeness, and I agree with the above.   Hollice Espy, MD

## 2020-04-07 ENCOUNTER — Ambulatory Visit (INDEPENDENT_AMBULATORY_CARE_PROVIDER_SITE_OTHER): Payer: BC Managed Care – PPO | Admitting: Urology

## 2020-04-07 ENCOUNTER — Other Ambulatory Visit: Payer: Self-pay

## 2020-04-07 VITALS — BP 196/61 | HR 112 | Wt 191.0 lb

## 2020-04-07 DIAGNOSIS — N390 Urinary tract infection, site not specified: Secondary | ICD-10-CM

## 2020-04-07 LAB — BLADDER SCAN AMB NON-IMAGING: Scan Result: 0

## 2020-04-07 MED ORDER — CEPHALEXIN 500 MG PO CAPS: 500.0000 mg | ORAL_CAPSULE | Freq: Three times a day (TID) | ORAL | 0 refills | Status: DC

## 2020-04-09 LAB — URINALYSIS, COMPLETE
Bilirubin, UA: NEGATIVE
Glucose, UA: NEGATIVE
Ketones, UA: NEGATIVE
Nitrite, UA: NEGATIVE
Specific Gravity, UA: 1.02 (ref 1.005–1.030)
Urobilinogen, Ur: 0.2 mg/dL (ref 0.2–1.0)
pH, UA: 7 (ref 5.0–7.5)

## 2020-04-09 LAB — MICROSCOPIC EXAMINATION: WBC, UA: >30 /hpf — AB (ref 0–5)

## 2020-04-10 LAB — CULTURE, URINE COMPREHENSIVE

## 2020-04-21 ENCOUNTER — Other Ambulatory Visit: Payer: Self-pay

## 2020-04-21 DIAGNOSIS — N390 Urinary tract infection, site not specified: Secondary | ICD-10-CM

## 2020-04-22 ENCOUNTER — Other Ambulatory Visit: Payer: Self-pay

## 2020-04-22 ENCOUNTER — Other Ambulatory Visit: Payer: BC Managed Care – PPO

## 2020-04-22 ENCOUNTER — Telehealth: Payer: Self-pay

## 2020-04-22 DIAGNOSIS — N390 Urinary tract infection, site not specified: Secondary | ICD-10-CM

## 2020-04-22 MED ORDER — CIPROFLOXACIN HCL 500 MG PO TABS
500.0000 mg | ORAL_TABLET | Freq: Two times a day (BID) | ORAL | 0 refills | Status: AC
Start: 2020-04-22 — End: 2020-04-29

## 2020-04-22 NOTE — Progress Notes (Signed)
UA reviewed, there is greater than 30 white blood cells and microscopic hematuria as well as few bacteria.  We will send this for culture.  Given that she is symptomatic, we will go ahead and treat again.  Lets try different antibiotic this time, will treat with Cipro 500 mg twice daily for prolonged course of 7 days.  There are some reports of tendon rupture using this medication thus I did recommend avoiding rigorous exercise and stop taking if she has any tendon or joint pain.  If she has another infection after this point, we may consider placing her on suppressive antibiotics for the short-term.  We will let her know what the culture grows.  Stacie Espy, MD      Spoke with patient, verbalized understanding. Advised patient will sent medication to pharm . Pt aware- Stacie [name], RMA.

## 2020-04-23 DIAGNOSIS — G4733 Obstructive sleep apnea (adult) (pediatric): Secondary | ICD-10-CM | POA: Diagnosis not present

## 2020-04-23 LAB — MICROSCOPIC EXAMINATION: WBC, UA: >30 /hpf — AB (ref 0–5)

## 2020-04-23 LAB — URINALYSIS, COMPLETE
Bilirubin, UA: NEGATIVE
Glucose, UA: NEGATIVE
Ketones, UA: NEGATIVE
Nitrite, UA: NEGATIVE
Specific Gravity, UA: 1.02 (ref 1.005–1.030)
Urobilinogen, Ur: 0.2 mg/dL (ref 0.2–1.0)
pH, UA: 5.5 (ref 5.0–7.5)

## 2020-04-25 LAB — CULTURE, URINE COMPREHENSIVE

## 2020-05-01 DIAGNOSIS — I2721 Secondary pulmonary arterial hypertension: Secondary | ICD-10-CM | POA: Diagnosis not present

## 2020-05-01 DIAGNOSIS — M359 Systemic involvement of connective tissue, unspecified: Secondary | ICD-10-CM | POA: Diagnosis not present

## 2020-05-06 ENCOUNTER — Other Ambulatory Visit: Payer: Self-pay | Admitting: Internal Medicine

## 2020-05-08 ENCOUNTER — Encounter: Payer: Self-pay | Admitting: Urology

## 2020-05-12 ENCOUNTER — Ambulatory Visit (INDEPENDENT_AMBULATORY_CARE_PROVIDER_SITE_OTHER): Payer: BC Managed Care – PPO | Admitting: Physician Assistant

## 2020-05-12 ENCOUNTER — Other Ambulatory Visit: Payer: Self-pay

## 2020-05-12 ENCOUNTER — Encounter: Payer: Self-pay | Admitting: Physician Assistant

## 2020-05-12 VITALS — BP 138/78 | HR 92 | Ht 67.0 in | Wt 190.0 lb

## 2020-05-12 DIAGNOSIS — R3915 Urgency of urination: Secondary | ICD-10-CM

## 2020-05-12 DIAGNOSIS — N362 Urethral caruncle: Secondary | ICD-10-CM | POA: Diagnosis not present

## 2020-05-12 DIAGNOSIS — N390 Urinary tract infection, site not specified: Secondary | ICD-10-CM | POA: Diagnosis not present

## 2020-05-12 LAB — BLADDER SCAN AMB NON-IMAGING

## 2020-05-12 MED ORDER — MIRABEGRON ER 25 MG PO TB24: 25.0000 mg | ORAL_TABLET | Freq: Every day | ORAL | 0 refills | Status: DC

## 2020-05-12 MED ORDER — PREMARIN 0.625 MG/GM VA CREA: TOPICAL_CREAM | VAGINAL | 3 refills | Status: DC

## 2020-05-12 NOTE — Patient Instructions (Signed)
1. I will send your urine for culture today and contact you with your results. Please use Azo (pyridium) in the meantime for symptom control. 2. Please restart Premarin topical vaginal estrogen cream three times weekly. I have sent a prescription for this to your pharmacy. 3. Please start Myrbetriq 69m daily. I have given you four weeks of samples in clinic today; I will see you back in clinic in 1 month to recheck your symptoms on this medication.

## 2020-05-12 NOTE — Progress Notes (Signed)
05/12/2020 4:39 PM   Stacie [name] 11-22-1955 [id number]  CC: Chief Complaint  Patient presents with  . Recurrent UTI   HPI: Stacie [name] is a 64 y.o. female with PMH recurrent UTI, microscopic hematuria, and nephrolithiasis s/p left URS/LL/stent with Dr. Erlene Quan on 02/20/2020 for management of a 4 mm distal left ureteral stone who presents today for evaluation of possible UTI.  Intraoperative findings notable for urethral caruncle; she was started on Premarin cream at that time.    She was seen in follow-up by Dr. Erlene Quan on 04/07/2020 with grossly infected UA.  Urine culture ultimately grew Macrobid and tetracycline resistant Proteus mirabilis; she was treated with Keflex 500 mg 3 times daily x7 days and repeat urine culture dated 04/22/2020 resulted with mixed urogenital flora.  Today she reports her urinary symptoms improved with Keflex, however they did not completely resolve.  She has persistent urinary frequency, urgency, insensate urinary incontinence, and pain with initiation of urination.  She is no longer using topical vaginal estrogen cream.  She does report a history of stress incontinence and dry mouth at baseline.  She denies a history of constipation.  In-office UA today positive for trace-lysed blood; urine microscopy with 6-10 WBCs/HP and 3-10 RBCs/HPF. PVR 3m.  PMH: Past Medical History:  Diagnosis Date  . Arrhythmia    patient unaware if this is current  . Asthma   . GERD (gastroesophageal reflux disease)   . Heart murmur   . History of kidney stones   . HOH (hard of hearing)    wear aids  . Hypothyroidism   . IBS (irritable bowel syndrome)   . [place]   . Sarcoid   . Sarcoidosis   . Seasonal allergies   . Sleep apnea CPAP with O2  . Wears hearing aid in both ears     Surgical History: Past Surgical History:  Procedure Laterality Date  . CARDIAC CATHETERIZATION  10/18/2018   [facility]  . CATARACT EXTRACTION W/PHACO Left  07/31/2019   Procedure: CATARACT EXTRACTION PHACO AND INTRAOCULAR LENS PLACEMENT (IOC) LEFT 00:51.1  17.9%  9.15;  Surgeon: BLeandrew Koyanagi MD;  [facility]  Service: Ophthalmology;  Laterality: Left;  keep this patient second  . COLON SURGERY     "colon was fused to bladder - operated on both"  . COLONOSCOPY  09/18/2007   diverticuli, no polyps  . COLONOSCOPY  05/26/2010   diverticuli, no polyps  . CYSTOSCOPY/URETEROSCOPY/HOLMIUM LASER/STENT PLACEMENT Left 02/20/2020   Procedure: CYSTOSCOPY/URETEROSCOPY/LITHOTRIPSY /STENT PLACEMENT;  Surgeon: BHollice Espy MD;  Location: [facility];  Service: Urology;  Laterality: Left;  . PARS PLANA VITRECTOMY Right 05/20/2015   Procedure: PARS PLANA VITRECTOMY WITH 25 GAUGE, laser;  Surgeon: JMilus Height MD;  Location: [facility];  Service: Ophthalmology;  Laterality: Right;  . PARTIAL HYSTERECTOMY  1990  . TUBAL LIGATION      Home Medications:  Allergies as of 05/12/2020      Reactions   Corn-containing Products Diarrhea   headaches   Nitrofurantoin Nausea Only   Tramadol    Nausea and vomiting   Sulfa Antibiotics Rash   As an infant      Medication List       Accurate as of May 12, 2020  4:39 PM. If you have any questions, ask your nurse or doctor.        STOP taking these medications   tamsulosin 0.4 MG Caps capsule Commonly known as: FLOMAX Stopped by: SDebroah Loop PA-C  TAKE these medications   albuterol 108 (90 Base) MCG/ACT inhaler Commonly known as: Ventolin HFA Inhale 2 puffs into the lungs every 6 (six) hours as needed for wheezing.   brimonidine 0.2 % ophthalmic solution Commonly known as: ALPHAGAN Place 1 drop into both eyes 3 (three) times daily.   budesonide-formoterol 80-4.5 MCG/ACT inhaler Commonly known as: Symbicort Take 2 puffs first thing in am and then another 2 puffs about 12 hours later. What changed:   how much to take  how to take this  when to take this     calcium carbonate 500 MG chewable tablet Commonly known as: TUMS - dosed in mg elemental calcium Chew 1 tablet by mouth daily as needed for indigestion or heartburn.   cephALEXin 500 MG capsule Commonly known as: Keflex Take 1 capsule (500 mg total) by mouth 3 (three) times daily.   denosumab 60 MG/ML Sosy injection Commonly known as: PROLIA Inject 60 mg into the skin every 6 (six) months.   dorzolamide 2 % ophthalmic solution Commonly known as: TRUSOPT Place 1 drop into both eyes 2 (two) times daily.   escitalopram 20 MG tablet Commonly known as: LEXAPRO   Flutter Devi Use as directed   folic acid 1 MG tablet Commonly known as: FOLVITE Take 1 mg by mouth daily.   latanoprost 0.005 % ophthalmic solution Commonly known as: XALATAN Place 1 drop into the right eye at bedtime.   Trexall 5 MG tablet Generic drug: methotrexate TAKE 5 TABLETS (25 MG TOTAL) BY MOUTH EVERY 7 (SEVEN) DAYS   methotrexate 2.5 MG tablet Commonly known as: RHEUMATREX Take by mouth.   mirabegron ER 25 MG Tb24 tablet Commonly known as: MYRBETRIQ Take 1 tablet (25 mg total) by mouth daily. Started by: Debroah Loop, PA-C   multivitamin with minerals tablet Take 1 tablet by mouth daily.   NON FORMULARY CPAP nightly   oxybutynin 5 MG tablet Commonly known as: DITROPAN   oxyCODONE-acetaminophen 5-325 MG tablet Commonly known as: PERCOCET/ROXICET   OXYGEN Inhale 2 L into the lungs as needed.   predniSONE 2.5 MG tablet Commonly known as: DELTASONE Take 12.5 mg by mouth daily with breakfast. 12.5 mg   Premarin vaginal cream Generic drug: conjugated estrogens Apply one pea-sized amount around the opening of the urethra three times weekly. Started by: Debroah Loop, PA-C   Synthroid 137 MCG tablet Generic drug: levothyroxine TAKE 1 TABLET (137 MCG TOTAL) BY MOUTH DAILY BEFORE BREAKFAST.   Treprostinil 0.6 MG/ML Soln Commonly known as: TYVASO Inhale 18 mcg into the  lungs 4 (four) times daily. 12 breaths 4 times a day   Vitamin D (Ergocalciferol) 1.25 MG (50000 UNIT) Caps capsule Commonly known as: DRISDOL Take 50,000 Units by mouth once a week.   VITAMIN D PO Take 1 tablet by mouth once a week.       Allergies:  Allergies  Allergen Reactions  . Corn-Containing Products Diarrhea    headaches  . Nitrofurantoin Nausea Only  . Tramadol     Nausea and vomiting  . Sulfa Antibiotics Rash    As an infant    Family History: Family History  Problem Relation Age of Onset  . Allergies Father   . Asthma Father   . Colon cancer Father   . Allergies Brother   . Asthma Brother   . Breast cancer Maternal Grandmother     Social History:   reports that she has never smoked. She has never used smokeless tobacco. She reports that she  does not drink alcohol and does not use drugs.  Physical Exam: BP (!) 138/78 (BP Location: Left Arm, Patient Position: Sitting, Cuff Size: Normal)   Pulse 92   Ht _0  (1.702 m)   Wt 190 lb (86.2 kg)   BMI 29.76 kg/m   Constitutional:  Alert and oriented, no acute distress, nontoxic appearing HEENT: [place], AT Cardiovascular: No clubbing, cyanosis, or edema Respiratory: Normal respiratory effort, no increased work of breathing Skin: No rashes, bruises or suspicious lesions Neurologic: Grossly intact, no focal deficits, moving all 4 extremities Psychiatric: Normal mood and affect  Laboratory Data: Results for orders placed or performed in visit on 05/12/20  Microscopic Examination   Urine  Result Value Ref Range   WBC, UA 6-10 (A) 0 - 5 /hpf   RBC 3-10 (A) 0 - 2 /hpf   Epithelial Cells (non renal) 0-10 0 - 10 /hpf   Bacteria, UA None seen None seen/Few  Urinalysis, Complete  Result Value Ref Range   Specific Gravity, UA 1.020 1.005 - 1.030   pH, UA 0.5 (LL) 5.0 - 7.5   Color, UA Yellow Yellow   Appearance Ur Clear Clear   Leukocytes,UA Negative Negative   Protein,UA Negative Negative/Trace   Glucose, UA  Negative Negative   Ketones, UA Negative Negative   RBC, UA Trace (A) Negative   Bilirubin, UA Negative Negative   Urobilinogen, Ur 0.2 0.2 - 1.0 mg/dL   Nitrite, UA Negative Negative   Microscopic Examination See below:   BLADDER SCAN AMB NON-IMAGING  Result Value Ref Range   Scan Result 48m    Assessment & Plan:   1. Recurrent UTI UA today notable for mild pyuria and microscopic hematuria.  Will send for culture for further evaluation and treat as indicated.  Counseled patient to use Pyridium in the interim as needed for symptom management.  She expressed understanding. - Urinalysis, Complete - CULTURE, URINE COMPREHENSIVE  2. Urinary urgency PVR WNL, reassuring for retention.  Given her symptoms, I suspect an element of OAB.  I recommended a trial of Myrbetriq 25 mg daily x1 month.  I explained that this medication takes at least 2 weeks of consistent use to produce symptomatic improvement.  I offered her a repeat visit with me in 1 month for symptom recheck and PVR.  She expressed understanding. - mirabegron ER (MYRBETRIQ) 25 MG TB24 tablet; Take 1 tablet (25 mg total) by mouth daily.  Dispense: 28 tablet; Refill: 0 - BLADDER SCAN AMB NON-IMAGING  3. Urethral caruncle Counseled her to restart topical vaginal estrogen cream at this time for management of recurrent UTI.  She expressed understanding. - conjugated estrogens (PREMARIN) vaginal cream; Apply one pea-sized amount around the opening of the urethra three times weekly.  Dispense: 30 g; Refill: 3   Return in about 4 weeks (around 06/09/2020) for Symptom recheck with PVR.  SDebroah Loop PA-C  [facility] [address] [place] [phone]

## 2020-05-13 LAB — MICROSCOPIC EXAMINATION: Bacteria, UA: NONE SEEN

## 2020-05-13 LAB — URINALYSIS, COMPLETE
Bilirubin, UA: NEGATIVE
Glucose, UA: NEGATIVE
Ketones, UA: NEGATIVE
Leukocytes,UA: NEGATIVE
Nitrite, UA: NEGATIVE
Protein,UA: NEGATIVE
Specific Gravity, UA: 1.02 (ref 1.005–1.030)
Urobilinogen, Ur: 0.2 mg/dL (ref 0.2–1.0)
pH, UA: 5.5 (ref 5.0–7.5)

## 2020-05-15 LAB — CULTURE, URINE COMPREHENSIVE

## 2020-05-24 DIAGNOSIS — G4733 Obstructive sleep apnea (adult) (pediatric): Secondary | ICD-10-CM | POA: Diagnosis not present

## 2020-05-28 ENCOUNTER — Encounter: Payer: BC Managed Care – PPO | Admitting: Internal Medicine

## 2020-05-28 NOTE — Progress Notes (Deleted)
Date:  05/28/2020   Name:  Stacie [name]   DOB:  [birth year]   MRN:  [id number]   Chief Complaint: No chief complaint on file.  Stacie [name] is a 64 y.o. female who presents today for her Complete Annual Exam. She feels {DESC; WELL/FAIRLY WELL/POORLY:18703}. She reports exercising ***. She reports she is sleeping {DESC; WELL/FAIRLY WELL/POORLY:18703}. Breast complaints ***.  Mammogram: 07/2019 DEXA: 03/12/20 osteoporosis no change from 2019 Pap smear: discontinued Colonoscopy: 05/2010 PPV-23 due in 2023  Immunization History  Administered Date(s) Administered  . H1N1 09/15/2008  . Influenza Split 09/01/2013, 08/03/2016  . Influenza Whole 07/17/2012, 07/17/2017  . Influenza, Seasonal, Injecte, Preservative Fre 07/17/2008, 08/17/2012  . Influenza,inj,Quad PF,6+ Mos 08/07/2012, 07/30/2014, 11/10/2015, 06/01/2017, 06/21/2018, 06/26/2019  . Moderna SARS-COVID-2 Vaccination 12/14/2019  . PPD Test 07/28/2008  . Pneumococcal Conjugate-13 12/06/2018  . Pneumococcal Polysaccharide-23 10/17/2010, 08/28/2017  . Zoster Recombinat (Shingrix) 05/27/2019, 11/04/2019    Thyroid Problem Presents for follow-up visit. Patient reports no anxiety, constipation, diarrhea, fatigue, palpitations or tremors. The symptoms have been stable.  Depression        This is a chronic problem.The problem is unchanged.  Associated symptoms include no fatigue and no headaches.  Past treatments include SSRIs - Selective serotonin reuptake inhibitors.  Compliance with treatment is good.  Previous treatment provided significant relief.  Past medical history includes thyroid problem.      Last vitamin D Lab Results  Component Value Date   VD25OH 28.3 (L) 05/09/2018    Lab Results  Component Value Date   CREATININE 1.00 02/20/2020   BUN 17 02/20/2020   NA 142 02/20/2020   K 3.4 (L) 02/20/2020   CL 104 02/20/2020   CO2 32 02/18/2020   Lab Results  Component Value Date   CHOL 215 (H) 05/27/2019    HDL 59 05/27/2019   LDLCALC 125 (H) 05/27/2019   TRIG 155 (H) 05/27/2019   CHOLHDL 3.6 05/27/2019   Lab Results  Component Value Date   TSH 0.120 (L) 05/27/2019   Lab Results  Component Value Date   HGBA1C 6.0 (H) 04/20/2017   Lab Results  Component Value Date   WBC 12.4 (H) 02/18/2020   HGB 13.6 02/20/2020   HCT 40.0 02/20/2020   MCV 94.4 02/18/2020   PLT 281 02/18/2020   Lab Results  Component Value Date   ALT 16 05/27/2019   AST 23 05/27/2019   ALKPHOS 71 05/27/2019   BILITOT 0.3 05/27/2019     Review of Systems  Constitutional: Negative for chills, fatigue and fever.  HENT: Negative for congestion, hearing loss, trouble swallowing and voice change.   Eyes: Negative for visual disturbance.  Respiratory: Positive for cough, chest tightness and shortness of breath. Negative for wheezing.   Cardiovascular: Negative for chest pain, palpitations and leg swelling.  Gastrointestinal: Negative for abdominal pain, constipation, diarrhea and vomiting.  Endocrine: Negative for polydipsia and polyuria.  Genitourinary: Negative for dysuria and frequency.  Musculoskeletal: Negative for arthralgias, gait problem and joint swelling.  Skin: Negative for color change and rash.  Allergic/Immunologic: Positive for environmental allergies.  Neurological: Negative for dizziness, tremors, light-headedness and headaches.  Hematological: Negative for adenopathy. Does not bruise/bleed easily.  Psychiatric/Behavioral: Positive for depression. Negative for dysphoric mood and sleep disturbance. The patient is not nervous/anxious.     Patient Active Problem List   Diagnosis Date Noted  . Pulmonary hypertension ([place]) 12/18/2018  . Depression, major, recurrent, in partial remission ([employer]) 10/22/2018  . Mood disorder (  [place]) 06/05/2018  . Compression fx, thoracic spine ([place]) 12/04/2017  . Morbid obesity due to excess calories ([place]) 05/08/2017  . Hyperlipidemia, mixed 04/21/2017  . Elevated  blood pressure reading 04/20/2017  . Cough variant asthma  vs UACS  02/20/2017  . Hypothyroidism, acquired 03/27/2015  . Essential hypertension 03/27/2015  . H/O abnormal cervical Papanicolaou smear 03/27/2015  . H/O renal calculi 03/27/2015  . Allergic rhinitis 01/07/2015  . Obstructive sleep apnea 09/29/2014  . Other malaise and fatigue 06/03/2014  . Asthma, chronic 02/24/2014  . Cough 04/30/2013  . Pulmonary sarcoidosis ([place]) 03/12/2013  . CKD (chronic kidney disease) stage 3, GFR 30-59 ml/min ([facility]) 03/12/2013  . Calcium blood increased 05/23/2012  . Besnier-Boeck disease 02/08/2011    Allergies  Allergen Reactions  . Corn-Containing Products Diarrhea    headaches  . Nitrofurantoin Nausea Only  . Tramadol     Nausea and vomiting  . Sulfa Antibiotics Rash    As an infant    Past Surgical History:  Procedure Laterality Date  . CARDIAC CATHETERIZATION  10/18/2018   [facility]  . CATARACT EXTRACTION W/PHACO Left 07/31/2019   Procedure: CATARACT EXTRACTION PHACO AND INTRAOCULAR LENS PLACEMENT (IOC) LEFT 00:51.1  17.9%  9.15;  Surgeon: Leandrew Koyanagi, MD;  Location: [place];  Service: Ophthalmology;  Laterality: Left;  keep this patient second  . COLON SURGERY     "colon was fused to bladder - operated on both"  . COLONOSCOPY  09/18/2007   diverticuli, no polyps  . COLONOSCOPY  05/26/2010   diverticuli, no polyps  . CYSTOSCOPY/URETEROSCOPY/HOLMIUM LASER/STENT PLACEMENT Left 02/20/2020   Procedure: CYSTOSCOPY/URETEROSCOPY/LITHOTRIPSY /STENT PLACEMENT;  Surgeon: Hollice Espy, MD;  Location: [facility];  Service: Urology;  Laterality: Left;  . PARS PLANA VITRECTOMY Right 05/20/2015   Procedure: PARS PLANA VITRECTOMY WITH 25 GAUGE, laser;  Surgeon: Milus Height, MD;  Location: [facility];  Service: Ophthalmology;  Laterality: Right;  . PARTIAL HYSTERECTOMY  1990  . TUBAL LIGATION      Social History   Tobacco Use  . Smoking status: Never Smoker  . Smokeless  tobacco: Never Used  Vaping Use  . Vaping Use: Never used  Substance Use Topics  . Alcohol use: No  . Drug use: No     Medication list has been reviewed and updated.  No outpatient medications have been marked as taking for the 05/28/20 encounter (Appointment) with Glean Hess, MD.    [facility] 2/9 Scores 01/07/2020 10/21/2019 05/27/2019 04/26/2019  PHQ - 2 Score 0 0 0 6  PHQ- 9 Score 0 0 0 16    GAD 7 : Generalized Anxiety Score 04/26/2019  Nervous, Anxious, on Edge 1  Control/stop worrying 0  Worry too much - different things 1  Trouble relaxing 0  Restless 0  Easily annoyed or irritable 1  Afraid - awful might happen 0  Total GAD 7 Score 3  Anxiety Difficulty Not difficult at all    BP Readings from Last 3 Encounters:  05/12/20 (!) 138/78  04/07/20 (!) 196/61  03/19/20 (!) 159/91    Physical Exam Vitals and nursing note reviewed.  Constitutional:      General: She is not in acute distress.    Appearance: She is well-developed.  HENT:     Head: Normocephalic and atraumatic.     Right Ear: Tympanic membrane and ear canal normal.     Left Ear: Tympanic membrane and ear canal normal.     Nose:     Right Sinus: No  maxillary sinus tenderness.     Left Sinus: No maxillary sinus tenderness.  Eyes:     General: No scleral icterus.       Right eye: No discharge.        Left eye: No discharge.     Conjunctiva/sclera: Conjunctivae normal.  Neck:     Thyroid: No thyromegaly.     Vascular: No carotid bruit.  Cardiovascular:     Rate and Rhythm: Normal rate and regular rhythm.     Pulses: Normal pulses.     Heart sounds: Normal heart sounds.  Pulmonary:     Effort: Pulmonary effort is normal. No respiratory distress.     Breath sounds: Wheezing present.  Chest:     Breasts:        Right: No mass, nipple discharge, skin change or tenderness.        Left: No mass, nipple discharge, skin change or tenderness.  Abdominal:     General: Bowel sounds are normal.      Palpations: Abdomen is soft.     Tenderness: There is no abdominal tenderness.  Musculoskeletal:        General: Normal range of motion.     Cervical back: Normal range of motion. No erythema.     Right lower leg: No edema.     Left lower leg: No edema.  Lymphadenopathy:     Cervical: No cervical adenopathy.  Skin:    General: Skin is warm and dry.     Capillary Refill: Capillary refill takes less than 2 seconds.     Findings: No rash.  Neurological:     General: No focal deficit present.     Mental Status: She is alert and oriented to person, place, and time.     Cranial Nerves: No cranial nerve deficit.     Sensory: No sensory deficit.     Deep Tendon Reflexes: Reflexes are normal and symmetric.  Psychiatric:        Attention and Perception: Attention normal.        Mood and Affect: Mood normal.     Wt Readings from Last 3 Encounters:  05/12/20 190 lb (86.2 kg)  04/07/20 191 lb (86.6 kg)  03/19/20 191 lb (86.6 kg)    There were no vitals taken for this visit.  Assessment and Plan:

## 2020-05-29 DIAGNOSIS — M359 Systemic involvement of connective tissue, unspecified: Secondary | ICD-10-CM | POA: Diagnosis not present

## 2020-05-29 DIAGNOSIS — I2721 Secondary pulmonary arterial hypertension: Secondary | ICD-10-CM | POA: Diagnosis not present

## 2020-06-01 ENCOUNTER — Other Ambulatory Visit: Payer: Self-pay | Admitting: Physician Assistant

## 2020-06-01 DIAGNOSIS — R3915 Urgency of urination: Secondary | ICD-10-CM

## 2020-06-01 MED ORDER — MIRABEGRON ER 25 MG PO TB24: 25.0000 mg | ORAL_TABLET | Freq: Every day | ORAL | 0 refills | Status: DC

## 2020-06-01 NOTE — Telephone Encounter (Signed)
Ok to send in rx now?

## 2020-06-08 DIAGNOSIS — R0602 Shortness of breath: Secondary | ICD-10-CM | POA: Diagnosis not present

## 2020-06-08 DIAGNOSIS — Z6837 Body mass index (BMI) 37.0-37.9, adult: Secondary | ICD-10-CM | POA: Diagnosis not present

## 2020-06-08 DIAGNOSIS — Z79899 Other long term (current) drug therapy: Secondary | ICD-10-CM | POA: Diagnosis not present

## 2020-06-08 DIAGNOSIS — Z23 Encounter for immunization: Secondary | ICD-10-CM | POA: Diagnosis not present

## 2020-06-08 DIAGNOSIS — D869 Sarcoidosis, unspecified: Secondary | ICD-10-CM | POA: Diagnosis not present

## 2020-06-08 DIAGNOSIS — I272 Pulmonary hypertension, unspecified: Secondary | ICD-10-CM | POA: Diagnosis not present

## 2020-06-08 DIAGNOSIS — J9611 Chronic respiratory failure with hypoxia: Secondary | ICD-10-CM | POA: Diagnosis not present

## 2020-06-08 DIAGNOSIS — D860 Sarcoidosis of lung: Secondary | ICD-10-CM | POA: Diagnosis not present

## 2020-06-11 DIAGNOSIS — G4733 Obstructive sleep apnea (adult) (pediatric): Secondary | ICD-10-CM | POA: Diagnosis not present

## 2020-06-12 ENCOUNTER — Other Ambulatory Visit: Payer: Self-pay

## 2020-06-12 ENCOUNTER — Ambulatory Visit (INDEPENDENT_AMBULATORY_CARE_PROVIDER_SITE_OTHER): Payer: BC Managed Care – PPO | Admitting: Physician Assistant

## 2020-06-12 ENCOUNTER — Encounter: Payer: Self-pay | Admitting: Physician Assistant

## 2020-06-12 VITALS — BP 172/76 | HR 111 | Ht 68.0 in | Wt 192.8 lb

## 2020-06-12 DIAGNOSIS — R3915 Urgency of urination: Secondary | ICD-10-CM | POA: Diagnosis not present

## 2020-06-12 DIAGNOSIS — N390 Urinary tract infection, site not specified: Secondary | ICD-10-CM

## 2020-06-12 DIAGNOSIS — R3129 Other microscopic hematuria: Secondary | ICD-10-CM

## 2020-06-12 LAB — MICROSCOPIC EXAMINATION

## 2020-06-12 LAB — URINALYSIS, COMPLETE
Bilirubin, UA: NEGATIVE
Glucose, UA: NEGATIVE
Ketones, UA: NEGATIVE
Nitrite, UA: POSITIVE — AB
Protein,UA: NEGATIVE
Specific Gravity, UA: 1.02 (ref 1.005–1.030)
Urobilinogen, Ur: 0.2 mg/dL (ref 0.2–1.0)
pH, UA: 6.5 (ref 5.0–7.5)

## 2020-06-12 MED ORDER — MIRABEGRON ER 25 MG PO TB24: 25.0000 mg | ORAL_TABLET | Freq: Every day | ORAL | 11 refills | Status: DC

## 2020-06-12 NOTE — Progress Notes (Signed)
06/12/2020 3:28 PM   Stacie [name] 1956/08/12 [id number]  CC: Chief Complaint  Patient presents with  . Follow-up    HPI: Stacie [name] is a 64 y.o. female with PMH recurrent UTI, microscopic hematuria with workup negative for malignancy in 2021, urethral caruncle on topical vaginal estrogen cream, and nephrolithiasis who presents today for symptom recheck and PVR on Myrbetriq 25 mg daily.  I saw her in clinic most recently on 05/12/2020, at which time she reported urgency, frequency, insensate urinary incontinence, and pain with initiation of urination. I started her on a trial of Myrbetriq at that time for management of OAB.  Today she reports significant improvement in her urgency and frequency as well as resolution of urge incontinence and pain with initiation on Myrbetriq. She continues to experience occasional stress incontinence.  Overall, she is pleased with her progress on this medication.  In-office catheterized UA today positive for 1+ blood, nitrites, and trace leukocyte esterase; urine microscopy with 11-30 WBCs/HPF, 3-10 RBCs/HPF, and many bacteria. Bladder drained with volume 78m; last void 45 minutes prior.  PMH: Past Medical History:  Diagnosis Date  . Arrhythmia    patient unaware if this is current  . Asthma   . GERD (gastroesophageal reflux disease)   . Heart murmur   . History of kidney stones   . HOH (hard of hearing)    wear aids  . Hypothyroidism   . IBS (irritable bowel syndrome)   . [place]   . Sarcoid   . Sarcoidosis   . Seasonal allergies   . Sleep apnea CPAP with O2  . Wears hearing aid in both ears     Surgical History: Past Surgical History:  Procedure Laterality Date  . CARDIAC CATHETERIZATION  10/18/2018   [facility]  . CATARACT EXTRACTION W/PHACO Left 07/31/2019   Procedure: CATARACT EXTRACTION PHACO AND INTRAOCULAR LENS PLACEMENT (IOC) LEFT 00:51.1  17.9%  9.15;  Surgeon: BLeandrew Koyanagi MD;  Location:  [facility]  Service: Ophthalmology;  Laterality: Left;  keep this patient second  . COLON SURGERY     "colon was fused to bladder - operated on both"  . COLONOSCOPY  09/18/2007   diverticuli, no polyps  . COLONOSCOPY  05/26/2010   diverticuli, no polyps  . CYSTOSCOPY/URETEROSCOPY/HOLMIUM LASER/STENT PLACEMENT Left 02/20/2020   Procedure: CYSTOSCOPY/URETEROSCOPY/LITHOTRIPSY /STENT PLACEMENT;  Surgeon: BHollice Espy MD;  Location: [facility];  Service: Urology;  Laterality: Left;  . PARS PLANA VITRECTOMY Right 05/20/2015   Procedure: PARS PLANA VITRECTOMY WITH 25 GAUGE, laser;  Surgeon: JMilus Height MD;  Location: [facility];  Service: Ophthalmology;  Laterality: Right;  . PARTIAL HYSTERECTOMY  1990  . TUBAL LIGATION      Home Medications:  Allergies as of 06/12/2020      Reactions   Corn-containing Products Diarrhea   headaches   Nitrofurantoin Nausea Only   Tramadol    Nausea and vomiting   Sulfa Antibiotics Rash   As an infant      Medication List       Accurate as of June 12, 2020  3:28 PM. If you have any questions, ask your nurse or doctor.        albuterol 108 (90 Base) MCG/ACT inhaler Commonly known as: Ventolin HFA Inhale 2 puffs into the lungs every 6 (six) hours as needed for wheezing.   brimonidine 0.2 % ophthalmic solution Commonly known as: ALPHAGAN Place 1 drop into both eyes 3 (three) times daily.   budesonide-formoterol 80-4.5 MCG/ACT  inhaler Commonly known as: Symbicort Take 2 puffs first thing in am and then another 2 puffs about 12 hours later. What changed:   how much to take  how to take this  when to take this   calcium carbonate 500 MG chewable tablet Commonly known as: TUMS - dosed in mg elemental calcium Chew 1 tablet by mouth daily as needed for indigestion or heartburn.   cephALEXin 500 MG capsule Commonly known as: Keflex Take 1 capsule (500 mg total) by mouth 3 (three) times daily.   denosumab 60 MG/ML Sosy injection  Commonly known as: PROLIA Inject 60 mg into the skin every 6 (six) months.   dorzolamide 2 % ophthalmic solution Commonly known as: TRUSOPT Place 1 drop into both eyes 2 (two) times daily.   escitalopram 20 MG tablet Commonly known as: LEXAPRO   Flutter Devi Use as directed   folic acid 1 MG tablet Commonly known as: FOLVITE Take 1 mg by mouth daily.   latanoprost 0.005 % ophthalmic solution Commonly known as: XALATAN Place 1 drop into the right eye at bedtime.   Trexall 5 MG tablet Generic drug: methotrexate TAKE 5 TABLETS (25 MG TOTAL) BY MOUTH EVERY 7 (SEVEN) DAYS   methotrexate 2.5 MG tablet Commonly known as: RHEUMATREX Take by mouth.   mirabegron ER 25 MG Tb24 tablet Commonly known as: MYRBETRIQ Take 1 tablet (25 mg total) by mouth daily.   multivitamin with minerals tablet Take 1 tablet by mouth daily.   NON FORMULARY CPAP nightly   oxybutynin 5 MG tablet Commonly known as: DITROPAN   oxyCODONE-acetaminophen 5-325 MG tablet Commonly known as: PERCOCET/ROXICET   OXYGEN Inhale 2 L into the lungs as needed.   predniSONE 2.5 MG tablet Commonly known as: DELTASONE Take 12.5 mg by mouth daily with breakfast. 12.5 mg   Premarin vaginal cream Generic drug: conjugated estrogens Apply one pea-sized amount around the opening of the urethra three times weekly.   Synthroid 137 MCG tablet Generic drug: levothyroxine TAKE 1 TABLET (137 MCG TOTAL) BY MOUTH DAILY BEFORE BREAKFAST.   Treprostinil 0.6 MG/ML Soln Commonly known as: TYVASO Inhale 18 mcg into the lungs 4 (four) times daily. 12 breaths 4 times a day   Vitamin D (Ergocalciferol) 1.25 MG (50000 UNIT) Caps capsule Commonly known as: DRISDOL Take 50,000 Units by mouth once a week.   VITAMIN D PO Take 1 tablet by mouth once a week.       Allergies:  Allergies  Allergen Reactions  . Corn-Containing Products Diarrhea    headaches  . Nitrofurantoin Nausea Only  . Tramadol     Nausea and  vomiting  . Sulfa Antibiotics Rash    As an infant    Family History: Family History  Problem Relation Age of Onset  . Allergies Father   . Asthma Father   . Colon cancer Father   . Allergies Brother   . Asthma Brother   . Breast cancer Maternal Grandmother     Social History:   reports that she has never smoked. She has never used smokeless tobacco. She reports that she does not drink alcohol and does not use drugs.  Physical Exam: BP (!) 172/76 (BP Location: Left Arm, Patient Position: Sitting, Cuff Size: Normal)   Pulse (!) 111   Ht _0  (1.727 m)   Wt 192 lb 12.8 oz (87.5 kg)   BMI 29.32 kg/m   Constitutional:  Alert and oriented, no acute distress, nontoxic appearing HEENT: [place], AT Cardiovascular:  No clubbing, cyanosis, or edema Respiratory: Normal respiratory effort, no increased work of breathing GU: Urethral caruncle noted. Skin: No rashes, bruises or suspicious lesions Neurologic: Grossly intact, no focal deficits, moving all 4 extremities Psychiatric: Normal mood and affect  Laboratory Data: Results for orders placed or performed in visit on 06/12/20  Microscopic Examination   Urine  Result Value Ref Range   WBC, UA 11-30 (A) 0 - 5 /hpf   RBC 3-10 (A) 0 - 2 /hpf   Epithelial Cells (non renal) 0-10 0 - 10 /hpf   Casts Present (A) None seen /lpf   Cast Type Hyaline casts N/A   Bacteria, UA Many (A) None seen/Few  Urinalysis, Complete  Result Value Ref Range   Specific Gravity, UA 1.020 1.005 - 1.030   pH, UA 6.5 5.0 - 7.5   Color, UA Yellow Yellow   Appearance Ur Cloudy (A) Clear   Leukocytes,UA Trace (A) Negative   Protein,UA Negative Negative/Trace   Glucose, UA Negative Negative   Ketones, UA Negative Negative   RBC, UA 1+ (A) Negative   Bilirubin, UA Negative Negative   Urobilinogen, Ur 0.2 0.2 - 1.0 mg/dL   Nitrite, UA Positive (A) Negative   Microscopic Examination See below:    Assessment & Plan:   1. Urinary urgency Significantly  improved on Myrbetriq 25 mg daily.  We will refill this for 1 year.  UA grossly infected today, however patient is asymptomatic of urinary infection.  Will send for culture for data but defer treatment unless she develops UTI symptoms in the coming days.  Counseled her to contact the office if she develops these.  She expressed understanding. - Urinalysis, Complete - CULTURE, URINE COMPREHENSIVE - mirabegron ER (MYRBETRIQ) 25 MG TB24 tablet; Take 1 tablet (25 mg total) by mouth daily.  Dispense: 30 tablet; Refill: 11  2. Microscopic hematuria Persistent on cath UA today, however she recently completed hematuria work-up.  No further intervention indicated, will repeat every 3 to 5 years if persistent per AUA guidelines.  Patient expressed understanding.  Return in about 1 year (around 06/12/2021) for OAB annual with PVR.  Debroah Loop, PA-C  [facility] [address] [place], [place] [address] [phone]

## 2020-06-12 NOTE — Patient Instructions (Signed)
Continue Myrbetriq.  Call me if you develop UTI symptoms in the coming days and I'll treat you based on your urine results from today.

## 2020-06-15 LAB — CULTURE, URINE COMPREHENSIVE

## 2020-06-24 DIAGNOSIS — G4733 Obstructive sleep apnea (adult) (pediatric): Secondary | ICD-10-CM | POA: Diagnosis not present

## 2020-06-26 DIAGNOSIS — M79672 Pain in left foot: Secondary | ICD-10-CM | POA: Diagnosis not present

## 2020-06-26 DIAGNOSIS — M359 Systemic involvement of connective tissue, unspecified: Secondary | ICD-10-CM | POA: Diagnosis not present

## 2020-06-26 DIAGNOSIS — I2721 Secondary pulmonary arterial hypertension: Secondary | ICD-10-CM | POA: Diagnosis not present

## 2020-06-26 DIAGNOSIS — S9032XA Contusion of left foot, initial encounter: Secondary | ICD-10-CM | POA: Diagnosis not present

## 2020-07-01 DIAGNOSIS — M810 Age-related osteoporosis without current pathological fracture: Secondary | ICD-10-CM | POA: Diagnosis not present

## 2020-07-06 DIAGNOSIS — M79672 Pain in left foot: Secondary | ICD-10-CM | POA: Diagnosis not present

## 2020-07-06 DIAGNOSIS — S9032XA Contusion of left foot, initial encounter: Secondary | ICD-10-CM | POA: Diagnosis not present

## 2020-07-08 DIAGNOSIS — Z7952 Long term (current) use of systemic steroids: Secondary | ICD-10-CM | POA: Diagnosis not present

## 2020-07-08 DIAGNOSIS — J45909 Unspecified asthma, uncomplicated: Secondary | ICD-10-CM | POA: Diagnosis not present

## 2020-07-08 DIAGNOSIS — I270 Primary pulmonary hypertension: Secondary | ICD-10-CM | POA: Diagnosis not present

## 2020-07-08 DIAGNOSIS — D869 Sarcoidosis, unspecified: Secondary | ICD-10-CM | POA: Diagnosis not present

## 2020-07-08 DIAGNOSIS — J9611 Chronic respiratory failure with hypoxia: Secondary | ICD-10-CM | POA: Diagnosis not present

## 2020-07-08 DIAGNOSIS — Z23 Encounter for immunization: Secondary | ICD-10-CM | POA: Diagnosis not present

## 2020-07-08 DIAGNOSIS — Z79899 Other long term (current) drug therapy: Secondary | ICD-10-CM | POA: Diagnosis not present

## 2020-07-08 DIAGNOSIS — J849 Interstitial pulmonary disease, unspecified: Secondary | ICD-10-CM | POA: Diagnosis not present

## 2020-07-08 DIAGNOSIS — I272 Pulmonary hypertension, unspecified: Secondary | ICD-10-CM | POA: Diagnosis not present

## 2020-07-08 DIAGNOSIS — R0689 Other abnormalities of breathing: Secondary | ICD-10-CM | POA: Diagnosis not present

## 2020-07-08 DIAGNOSIS — Z9981 Dependence on supplemental oxygen: Secondary | ICD-10-CM | POA: Diagnosis not present

## 2020-07-08 DIAGNOSIS — R0600 Dyspnea, unspecified: Secondary | ICD-10-CM | POA: Diagnosis not present

## 2020-07-09 ENCOUNTER — Emergency Department: Payer: BC Managed Care – PPO

## 2020-07-09 ENCOUNTER — Encounter: Payer: Self-pay | Admitting: Emergency Medicine

## 2020-07-09 ENCOUNTER — Inpatient Hospital Stay
Admission: EM | Admit: 2020-07-09 | Discharge: 2020-07-28 | DRG: 853 | Disposition: A | Discharge status: Rehabilitation Facility | Payer: BC Managed Care – PPO | Attending: Internal Medicine | Admitting: Internal Medicine

## 2020-07-09 ENCOUNTER — Other Ambulatory Visit: Payer: Self-pay

## 2020-07-09 DIAGNOSIS — N183 Chronic kidney disease, stage 3 unspecified: Secondary | ICD-10-CM | POA: Diagnosis not present

## 2020-07-09 DIAGNOSIS — Z87440 Personal history of urinary (tract) infections: Secondary | ICD-10-CM

## 2020-07-09 DIAGNOSIS — A414 Sepsis due to anaerobes: Secondary | ICD-10-CM

## 2020-07-09 DIAGNOSIS — I6389 Other cerebral infarction: Secondary | ICD-10-CM | POA: Diagnosis not present

## 2020-07-09 DIAGNOSIS — J150 Pneumonia due to Klebsiella pneumoniae: Secondary | ICD-10-CM | POA: Diagnosis present

## 2020-07-09 DIAGNOSIS — G9341 Metabolic encephalopathy: Secondary | ICD-10-CM | POA: Diagnosis present

## 2020-07-09 DIAGNOSIS — I129 Hypertensive chronic kidney disease with stage 1 through stage 4 chronic kidney disease, or unspecified chronic kidney disease: Secondary | ICD-10-CM | POA: Diagnosis not present

## 2020-07-09 DIAGNOSIS — B370 Candidal stomatitis: Secondary | ICD-10-CM | POA: Diagnosis not present

## 2020-07-09 DIAGNOSIS — N179 Acute kidney failure, unspecified: Secondary | ICD-10-CM | POA: Diagnosis not present

## 2020-07-09 DIAGNOSIS — R531 Weakness: Secondary | ICD-10-CM | POA: Diagnosis not present

## 2020-07-09 DIAGNOSIS — N136 Pyonephrosis: Secondary | ICD-10-CM | POA: Diagnosis not present

## 2020-07-09 DIAGNOSIS — R0602 Shortness of breath: Secondary | ICD-10-CM | POA: Diagnosis not present

## 2020-07-09 DIAGNOSIS — J45909 Unspecified asthma, uncomplicated: Secondary | ICD-10-CM | POA: Diagnosis present

## 2020-07-09 DIAGNOSIS — I6523 Occlusion and stenosis of bilateral carotid arteries: Secondary | ICD-10-CM | POA: Diagnosis not present

## 2020-07-09 DIAGNOSIS — T380X5A Adverse effect of glucocorticoids and synthetic analogues, initial encounter: Secondary | ICD-10-CM | POA: Diagnosis not present

## 2020-07-09 DIAGNOSIS — J8410 Pulmonary fibrosis, unspecified: Secondary | ICD-10-CM | POA: Diagnosis not present

## 2020-07-09 DIAGNOSIS — G4733 Obstructive sleep apnea (adult) (pediatric): Secondary | ICD-10-CM | POA: Diagnosis not present

## 2020-07-09 DIAGNOSIS — E870 Hyperosmolality and hypernatremia: Secondary | ICD-10-CM | POA: Diagnosis not present

## 2020-07-09 DIAGNOSIS — I639 Cerebral infarction, unspecified: Secondary | ICD-10-CM | POA: Diagnosis not present

## 2020-07-09 DIAGNOSIS — R140 Abdominal distension (gaseous): Secondary | ICD-10-CM | POA: Diagnosis not present

## 2020-07-09 DIAGNOSIS — J929 Pleural plaque without asbestos: Secondary | ICD-10-CM | POA: Diagnosis not present

## 2020-07-09 DIAGNOSIS — J9690 Respiratory failure, unspecified, unspecified whether with hypoxia or hypercapnia: Secondary | ICD-10-CM | POA: Diagnosis not present

## 2020-07-09 DIAGNOSIS — R29716 NIHSS score 16: Secondary | ICD-10-CM | POA: Diagnosis not present

## 2020-07-09 DIAGNOSIS — R6521 Severe sepsis with septic shock: Secondary | ICD-10-CM | POA: Diagnosis not present

## 2020-07-09 DIAGNOSIS — Z978 Presence of other specified devices: Secondary | ICD-10-CM

## 2020-07-09 DIAGNOSIS — R7881 Bacteremia: Secondary | ICD-10-CM | POA: Diagnosis not present

## 2020-07-09 DIAGNOSIS — N170 Acute kidney failure with tubular necrosis: Secondary | ICD-10-CM | POA: Diagnosis present

## 2020-07-09 DIAGNOSIS — R29818 Other symptoms and signs involving the nervous system: Secondary | ICD-10-CM | POA: Diagnosis not present

## 2020-07-09 DIAGNOSIS — E875 Hyperkalemia: Secondary | ICD-10-CM | POA: Diagnosis present

## 2020-07-09 DIAGNOSIS — I82409 Acute embolism and thrombosis of unspecified deep veins of unspecified lower extremity: Secondary | ICD-10-CM

## 2020-07-09 DIAGNOSIS — Z20822 Contact with and (suspected) exposure to covid-19: Secondary | ICD-10-CM | POA: Diagnosis not present

## 2020-07-09 DIAGNOSIS — I5021 Acute systolic (congestive) heart failure: Secondary | ICD-10-CM | POA: Diagnosis not present

## 2020-07-09 DIAGNOSIS — G9340 Encephalopathy, unspecified: Secondary | ICD-10-CM | POA: Diagnosis not present

## 2020-07-09 DIAGNOSIS — E039 Hypothyroidism, unspecified: Secondary | ICD-10-CM | POA: Diagnosis not present

## 2020-07-09 DIAGNOSIS — N132 Hydronephrosis with renal and ureteral calculous obstruction: Secondary | ICD-10-CM | POA: Diagnosis not present

## 2020-07-09 DIAGNOSIS — Z79890 Hormone replacement therapy (postmenopausal): Secondary | ICD-10-CM

## 2020-07-09 DIAGNOSIS — J479 Bronchiectasis, uncomplicated: Secondary | ICD-10-CM | POA: Diagnosis not present

## 2020-07-09 DIAGNOSIS — A4159 Other Gram-negative sepsis: Principal | ICD-10-CM | POA: Diagnosis present

## 2020-07-09 DIAGNOSIS — E785 Hyperlipidemia, unspecified: Secondary | ICD-10-CM | POA: Diagnosis present

## 2020-07-09 DIAGNOSIS — N200 Calculus of kidney: Secondary | ICD-10-CM | POA: Diagnosis not present

## 2020-07-09 DIAGNOSIS — R410 Disorientation, unspecified: Secondary | ICD-10-CM | POA: Diagnosis not present

## 2020-07-09 DIAGNOSIS — R4701 Aphasia: Secondary | ICD-10-CM | POA: Diagnosis not present

## 2020-07-09 DIAGNOSIS — R404 Transient alteration of awareness: Secondary | ICD-10-CM | POA: Diagnosis not present

## 2020-07-09 DIAGNOSIS — I70222 Atherosclerosis of native arteries of extremities with rest pain, left leg: Secondary | ICD-10-CM | POA: Diagnosis not present

## 2020-07-09 DIAGNOSIS — J849 Interstitial pulmonary disease, unspecified: Secondary | ICD-10-CM | POA: Diagnosis present

## 2020-07-09 DIAGNOSIS — Z800 Family history of malignant neoplasm of digestive organs: Secondary | ICD-10-CM

## 2020-07-09 DIAGNOSIS — H9190 Unspecified hearing loss, unspecified ear: Secondary | ICD-10-CM | POA: Diagnosis present

## 2020-07-09 DIAGNOSIS — Z885 Allergy status to narcotic agent status: Secondary | ICD-10-CM

## 2020-07-09 DIAGNOSIS — B961 Klebsiella pneumoniae [K. pneumoniae] as the cause of diseases classified elsewhere: Secondary | ICD-10-CM | POA: Diagnosis not present

## 2020-07-09 DIAGNOSIS — N390 Urinary tract infection, site not specified: Secondary | ICD-10-CM | POA: Diagnosis not present

## 2020-07-09 DIAGNOSIS — Z91018 Allergy to other foods: Secondary | ICD-10-CM

## 2020-07-09 DIAGNOSIS — R7309 Other abnormal glucose: Secondary | ICD-10-CM | POA: Diagnosis not present

## 2020-07-09 DIAGNOSIS — N201 Calculus of ureter: Secondary | ICD-10-CM | POA: Diagnosis not present

## 2020-07-09 DIAGNOSIS — E872 Acidosis: Secondary | ICD-10-CM | POA: Diagnosis present

## 2020-07-09 DIAGNOSIS — D860 Sarcoidosis of lung: Secondary | ICD-10-CM | POA: Diagnosis present

## 2020-07-09 DIAGNOSIS — I70221 Atherosclerosis of native arteries of extremities with rest pain, right leg: Secondary | ICD-10-CM | POA: Diagnosis not present

## 2020-07-09 DIAGNOSIS — A419 Sepsis, unspecified organism: Secondary | ICD-10-CM | POA: Diagnosis not present

## 2020-07-09 DIAGNOSIS — J9601 Acute respiratory failure with hypoxia: Secondary | ICD-10-CM | POA: Diagnosis not present

## 2020-07-09 DIAGNOSIS — N1831 Chronic kidney disease, stage 3a: Secondary | ICD-10-CM | POA: Diagnosis present

## 2020-07-09 DIAGNOSIS — N202 Calculus of kidney with calculus of ureter: Secondary | ICD-10-CM | POA: Diagnosis not present

## 2020-07-09 DIAGNOSIS — G8191 Hemiplegia, unspecified affecting right dominant side: Secondary | ICD-10-CM | POA: Diagnosis not present

## 2020-07-09 DIAGNOSIS — K5901 Slow transit constipation: Secondary | ICD-10-CM | POA: Diagnosis not present

## 2020-07-09 DIAGNOSIS — J9600 Acute respiratory failure, unspecified whether with hypoxia or hypercapnia: Secondary | ICD-10-CM | POA: Diagnosis not present

## 2020-07-09 DIAGNOSIS — R739 Hyperglycemia, unspecified: Secondary | ICD-10-CM | POA: Diagnosis not present

## 2020-07-09 DIAGNOSIS — Z9981 Dependence on supplemental oxygen: Secondary | ICD-10-CM

## 2020-07-09 DIAGNOSIS — I6340 Cerebral infarction due to embolism of unspecified cerebral artery: Secondary | ICD-10-CM | POA: Diagnosis not present

## 2020-07-09 DIAGNOSIS — K5900 Constipation, unspecified: Secondary | ICD-10-CM | POA: Diagnosis not present

## 2020-07-09 DIAGNOSIS — I10 Essential (primary) hypertension: Secondary | ICD-10-CM | POA: Diagnosis not present

## 2020-07-09 DIAGNOSIS — J9621 Acute and chronic respiratory failure with hypoxia: Secondary | ICD-10-CM | POA: Diagnosis present

## 2020-07-09 DIAGNOSIS — R5381 Other malaise: Secondary | ICD-10-CM | POA: Diagnosis not present

## 2020-07-09 DIAGNOSIS — I998 Other disorder of circulatory system: Secondary | ICD-10-CM | POA: Diagnosis not present

## 2020-07-09 DIAGNOSIS — I272 Pulmonary hypertension, unspecified: Secondary | ICD-10-CM | POA: Diagnosis present

## 2020-07-09 DIAGNOSIS — R339 Retention of urine, unspecified: Secondary | ICD-10-CM | POA: Diagnosis not present

## 2020-07-09 DIAGNOSIS — Z825 Family history of asthma and other chronic lower respiratory diseases: Secondary | ICD-10-CM

## 2020-07-09 DIAGNOSIS — I6603 Occlusion and stenosis of bilateral middle cerebral arteries: Secondary | ICD-10-CM | POA: Diagnosis not present

## 2020-07-09 DIAGNOSIS — R29810 Facial weakness: Secondary | ICD-10-CM | POA: Diagnosis not present

## 2020-07-09 DIAGNOSIS — D62 Acute posthemorrhagic anemia: Secondary | ICD-10-CM | POA: Diagnosis not present

## 2020-07-09 DIAGNOSIS — K760 Fatty (change of) liver, not elsewhere classified: Secondary | ICD-10-CM | POA: Diagnosis present

## 2020-07-09 DIAGNOSIS — Z79899 Other long term (current) drug therapy: Secondary | ICD-10-CM

## 2020-07-09 DIAGNOSIS — I248 Other forms of acute ischemic heart disease: Secondary | ICD-10-CM | POA: Diagnosis present

## 2020-07-09 DIAGNOSIS — J189 Pneumonia, unspecified organism: Secondary | ICD-10-CM | POA: Diagnosis not present

## 2020-07-09 DIAGNOSIS — I70229 Atherosclerosis of native arteries of extremities with rest pain, unspecified extremity: Secondary | ICD-10-CM

## 2020-07-09 DIAGNOSIS — J984 Other disorders of lung: Secondary | ICD-10-CM | POA: Diagnosis not present

## 2020-07-09 DIAGNOSIS — R188 Other ascites: Secondary | ICD-10-CM | POA: Diagnosis not present

## 2020-07-09 DIAGNOSIS — Z7952 Long term (current) use of systemic steroids: Secondary | ICD-10-CM

## 2020-07-09 DIAGNOSIS — D631 Anemia in chronic kidney disease: Secondary | ICD-10-CM | POA: Diagnosis present

## 2020-07-09 DIAGNOSIS — Z888 Allergy status to other drugs, medicaments and biological substances status: Secondary | ICD-10-CM

## 2020-07-09 DIAGNOSIS — D869 Sarcoidosis, unspecified: Secondary | ICD-10-CM | POA: Diagnosis not present

## 2020-07-09 DIAGNOSIS — Z7951 Long term (current) use of inhaled steroids: Secondary | ICD-10-CM

## 2020-07-09 DIAGNOSIS — R4781 Slurred speech: Secondary | ICD-10-CM | POA: Diagnosis not present

## 2020-07-09 DIAGNOSIS — A021 Salmonella sepsis: Secondary | ICD-10-CM | POA: Diagnosis not present

## 2020-07-09 DIAGNOSIS — Z882 Allergy status to sulfonamides status: Secondary | ICD-10-CM

## 2020-07-09 DIAGNOSIS — M898X9 Other specified disorders of bone, unspecified site: Secondary | ICD-10-CM | POA: Diagnosis present

## 2020-07-09 DIAGNOSIS — R471 Dysarthria and anarthria: Secondary | ICD-10-CM | POA: Diagnosis not present

## 2020-07-09 DIAGNOSIS — K219 Gastro-esophageal reflux disease without esophagitis: Secondary | ICD-10-CM | POA: Diagnosis present

## 2020-07-09 DIAGNOSIS — Z803 Family history of malignant neoplasm of breast: Secondary | ICD-10-CM

## 2020-07-09 DIAGNOSIS — K580 Irritable bowel syndrome with diarrhea: Secondary | ICD-10-CM | POA: Diagnosis present

## 2020-07-09 DIAGNOSIS — D696 Thrombocytopenia, unspecified: Secondary | ICD-10-CM | POA: Diagnosis not present

## 2020-07-09 DIAGNOSIS — R7989 Other specified abnormal findings of blood chemistry: Secondary | ICD-10-CM

## 2020-07-09 DIAGNOSIS — R579 Shock, unspecified: Secondary | ICD-10-CM

## 2020-07-09 DIAGNOSIS — Z8679 Personal history of other diseases of the circulatory system: Secondary | ICD-10-CM | POA: Diagnosis not present

## 2020-07-09 DIAGNOSIS — Z4682 Encounter for fitting and adjustment of non-vascular catheter: Secondary | ICD-10-CM | POA: Diagnosis not present

## 2020-07-09 DIAGNOSIS — E782 Mixed hyperlipidemia: Secondary | ICD-10-CM | POA: Diagnosis present

## 2020-07-09 DIAGNOSIS — R6520 Severe sepsis without septic shock: Secondary | ICD-10-CM | POA: Diagnosis not present

## 2020-07-09 DIAGNOSIS — R945 Abnormal results of liver function studies: Secondary | ICD-10-CM | POA: Diagnosis not present

## 2020-07-09 DIAGNOSIS — N2581 Secondary hyperparathyroidism of renal origin: Secondary | ICD-10-CM | POA: Diagnosis present

## 2020-07-09 DIAGNOSIS — D65 Disseminated intravascular coagulation [defibrination syndrome]: Secondary | ICD-10-CM | POA: Diagnosis not present

## 2020-07-09 DIAGNOSIS — R600 Localized edema: Secondary | ICD-10-CM

## 2020-07-09 DIAGNOSIS — I70269 Atherosclerosis of native arteries of extremities with gangrene, unspecified extremity: Secondary | ICD-10-CM | POA: Diagnosis not present

## 2020-07-09 DIAGNOSIS — I2721 Secondary pulmonary arterial hypertension: Secondary | ICD-10-CM | POA: Diagnosis present

## 2020-07-09 DIAGNOSIS — R0902 Hypoxemia: Secondary | ICD-10-CM | POA: Diagnosis not present

## 2020-07-09 DIAGNOSIS — I63233 Cerebral infarction due to unspecified occlusion or stenosis of bilateral carotid arteries: Secondary | ICD-10-CM | POA: Diagnosis not present

## 2020-07-09 MED ORDER — SODIUM CHLORIDE 0.9 % IV SOLN
500.0000 mg | Freq: Once | INTRAVENOUS | Status: AC
  Administered 2020-07-10: 500 mg via INTRAVENOUS
  Filled 2020-07-09: qty 500

## 2020-07-09 MED ORDER — CEFTAZIDIME 1 G IJ SOLR: 1.0000 g | Freq: Once | INTRAMUSCULAR | Status: DC

## 2020-07-09 MED ORDER — SODIUM CHLORIDE 0.9 % IV SOLN
1.0000 g | Freq: Once | INTRAVENOUS | Status: AC
  Administered 2020-07-10: 1 g via INTRAVENOUS
  Filled 2020-07-09: qty 1

## 2020-07-09 MED ORDER — LEVOFLOXACIN IN D5W 750 MG/150ML IV SOLN: 750.0000 mg | Freq: Once | INTRAVENOUS | Status: DC

## 2020-07-09 MED ORDER — LACTATED RINGERS IV SOLN: INTRAVENOUS | Status: DC

## 2020-07-09 NOTE — ED Triage Notes (Signed)
Pt arrival via [facility] from home due to several complaints. Pt got her flu vaccine on Wednesday and per husband, has been having increased confusion, weakness, diarrhea, and shob. Pt typically is able stand at home and is more oriented, but per EMS was unable to stand and follows some commands but isn't fully oriented. Stroke screen neg, 120/87 BP, 138 CBG, 97% on 5 L of [place]. Pt is typically on 2 L baseline o2.   Dr. Owens Shark at bedside. Pt breathing 30 times a min on arrival, resp called for bipap.

## 2020-07-09 NOTE — ED Provider Notes (Signed)
[facility] Emergency Department Provider Note  ____________________________________________   First MD Initiated Contact with Patient 07/09/20 2316     (approximate)  I have reviewed the triage vital signs and the nursing notes.  Level 5 caveat history review of system limited secondary to altered mental status.  History primarily obtained from EMS HISTORY  Chief Complaint Shortness of Breath and Altered Mental Status    HPI Stacie [name] is a 64 y.o. female with below list of previous medical conditions including pulmonary hypertension, pulmonary sarcoidosis, asthma hypertension chronic kidney disease presents to the emergency department via EMS secondary to altered mental status x1 day.  EMS states that the patient has had  diarrhea, generalized weakness and dyspnea during the period of time as well.  EMS also states that the patient received her flu vaccine 24 hours ago.  Patient has CV Covid vaccine including a booster shot as well per EMS.  On arrival patient does admit to dyspnea.  Patient is able to state her name however no other meaningful history obtained from the patient. Patient's husband does admit that the patient has had beforementioned symptoms and has been in bed for most of the day.       Past Medical History:  Diagnosis Date  . Arrhythmia    patient unaware if this is current  . Asthma   . GERD (gastroesophageal reflux disease)   . Heart murmur   . History of kidney stones   . HOH (hard of hearing)    wear aids  . Hypothyroidism   . IBS (irritable bowel syndrome)   . Pulmonary hypertension ([place])   . Sarcoid   . Sarcoidosis   . Seasonal allergies   . Sleep apnea CPAP with O2  . Wears hearing aid in both ears     Patient Active Problem List   Diagnosis Date Noted  . Sepsis ([place]) 07/10/2020  . Pulmonary hypertension ([place]) 12/18/2018  . Depression, major, recurrent, in partial remission ([place]) 10/22/2018  . Compression  fx, thoracic spine ([place]) 12/04/2017  . Morbid obesity due to excess calories ([place]) 05/08/2017  . Hyperlipidemia, mixed 04/21/2017  . Cough variant asthma  vs UACS  02/20/2017  . Hypothyroidism, acquired 03/27/2015  . Essential hypertension 03/27/2015  . Acquired absence of both cervix and uterus 03/27/2015  . H/O renal calculi 03/27/2015  . Allergic rhinitis 01/07/2015  . Obstructive sleep apnea 09/29/2014  . Other malaise and fatigue 06/03/2014  . Asthma, chronic 02/24/2014  . Cough 04/30/2013  . Pulmonary sarcoidosis ([place]) 03/12/2013  . CKD (chronic kidney disease) stage 3, GFR 30-59 ml/min (H[facility]) 03/12/2013  . Calcium blood increased 05/23/2012    Past Surgical History:  Procedure Laterality Date  . CARDIAC CATHETERIZATION  10/18/2018   [facility]  . CATARACT EXTRACTION W/PHACO Left 07/31/2019   Procedure: CATARACT EXTRACTION PHACO AND INTRAOCULAR LENS PLACEMENT (IOC) LEFT 00:51.1  17.9%  9.15;  Surgeon: Leandrew Koyanagi, MD;  Location: [place];  Service: Ophthalmology;  Laterality: Left;  keep this patient second  . COLON SURGERY     "colon was fused to bladder - operated on both"  . COLONOSCOPY  09/18/2007   diverticuli, no polyps  . COLONOSCOPY  05/26/2010   diverticuli, no polyps  . CYSTOSCOPY/URETEROSCOPY/HOLMIUM LASER/STENT PLACEMENT Left 02/20/2020   Procedure: CYSTOSCOPY/URETEROSCOPY/LITHOTRIPSY /STENT PLACEMENT;  Surgeon: Hollice Espy, MD;  Location: [facility];  Service: Urology;  Laterality: Left;  . PARS PLANA VITRECTOMY Right 05/20/2015   Procedure: PARS PLANA VITRECTOMY WITH 25  GAUGE, laser;  Surgeon: Milus Height, MD;  Location: [facility];  Service: Ophthalmology;  Laterality: Right;  . PARTIAL HYSTERECTOMY  1990  . TUBAL LIGATION      Prior to Admission medications   Medication Sig Start Date End Date Taking? Authorizing Provider  budesonide-formoterol (SYMBICORT) 80-4.5 MCG/ACT inhaler Take 2 puffs first thing in am and then another 2 puffs about  12 hours later. Patient taking differently: Inhale 2 puffs into the lungs in the morning and at bedtime. Take 2 puffs first thing in am and then another 2 puffs about 12 hours later. 05/08/17  Yes Tanda Rockers, MD  denosumab (PROLIA) 60 MG/ML SOSY injection Inject 60 mg into the skin every 6 (six) months.   Yes [provider]  dorzolamide (TRUSOPT) 2 % ophthalmic solution Place 1 drop into both eyes 2 (two) times daily.   Yes [provider]  escitalopram (LEXAPRO) 20 MG tablet  03/05/20  Yes [provider]  folic acid (FOLVITE) 1 MG tablet Take 1 mg by mouth daily. 07/30/18  Yes [provider]  latanoprost (XALATAN) 0.005 % ophthalmic solution Place 1 drop into the right eye at bedtime.   Yes [provider]  methotrexate (TREXALL) 5 MG tablet TAKE 5 TABLETS (25 MG TOTAL) BY MOUTH EVERY 7 (SEVEN) DAYS 03/04/20  Yes [provider]  Multiple Vitamins-Minerals (MULTIVITAMIN WITH MINERALS) tablet Take 1 tablet by mouth daily.   Yes [provider]  NON FORMULARY CPAP nightly   Yes [provider]  predniSONE (DELTASONE) 10 MG tablet Take 10 mg by mouth daily with breakfast. 12.5 mg 03/10/19  Yes [provider]  SYNTHROID 137 MCG tablet TAKE 1 TABLET (137 MCG TOTAL) BY MOUTH DAILY BEFORE BREAKFAST. 04/04/20  Yes Glean Hess, MD  Treprostinil (TYVASO) 0.6 MG/ML SOLN Inhale 18 mcg into the lungs 4 (four) times daily. 12 breaths 4 times a day   Yes [provider]  albuterol (VENTOLIN HFA) 108 (90 Base) MCG/ACT inhaler Inhale 2 puffs into the lungs every 6 (six) hours as needed for wheezing. Patient not taking: Reported on 07/10/2020 11/19/15   Laverle Hobby, MD  brimonidine (ALPHAGAN) 0.2 % ophthalmic solution Place 1 drop into both eyes 3 (three) times daily.  Patient not taking: Reported on 05/12/2020    [provider]  calcium carbonate (TUMS - DOSED IN MG ELEMENTAL CALCIUM) 500 MG chewable  tablet Chew 1 tablet by mouth daily as needed for indigestion or heartburn. Patient not taking: Reported on 07/10/2020    [provider]  cephALEXin (KEFLEX) 500 MG capsule Take 1 capsule (500 mg total) by mouth 3 (three) times daily. Patient not taking: Reported on 06/12/2020 04/07/20   Hollice Espy, MD  conjugated estrogens (PREMARIN) vaginal cream Apply one pea-sized amount around the opening of the urethra three times weekly. Patient not taking: Reported on 07/10/2020 05/12/20   Debroah Loop, PA-C  methotrexate (RHEUMATREX) 2.5 MG tablet Take by mouth. Patient not taking: Reported on 07/10/2020 03/06/20   [provider]  mirabegron ER (MYRBETRIQ) 25 MG TB24 tablet Take 1 tablet (25 mg total) by mouth daily. Patient not taking: Reported on 07/10/2020 06/12/20   Debroah Loop, PA-C  oxybutynin (DITROPAN) 5 MG tablet  02/20/20   [provider]  oxyCODONE-acetaminophen (PERCOCET/ROXICET) 5-325 MG tablet  02/20/20   [provider]  OXYGEN Inhale 2 L into the lungs as needed.    [provider]  Respiratory Therapy Supplies (FLUTTER) DEVI Use as  directed Patient not taking: Reported on 07/10/2020 10/26/17   Tanda Rockers, MD  VITAMIN D PO Take 1 tablet by mouth once a week. Patient not taking: Reported on 07/10/2020    [provider]  Vitamin D, Ergocalciferol, (DRISDOL) 1.25 MG (50000 UNIT) CAPS capsule Take 50,000 Units by mouth once a week. Patient not taking: Reported on 06/12/2020 03/17/20   [provider]    Allergies Corn-containing products, Nitrofurantoin, Tramadol, and Sulfa antibiotics  Family History  Problem Relation Age of Onset  . Allergies Father   . Asthma Father   . Colon cancer Father   . Allergies Brother   . Asthma Brother   . Breast cancer Maternal Grandmother     Social History Social History   Tobacco Use  . Smoking status: Never Smoker  . Smokeless tobacco: Never Used  Vaping Use   . Vaping Use: Never used  Substance Use Topics  . Alcohol use: No  . Drug use: No    Review of Systems Constitutional: No fever/chills Eyes: No visual changes. ENT: No sore throat. Cardiovascular: Denies chest pain. Respiratory: Positive for shortness of breath. Gastrointestinal: No abdominal pain.  No nausea, no vomiting.  No diarrhea.  No constipation. Genitourinary: Negative for dysuria. Musculoskeletal: Negative for neck pain.  Negative for back pain. Integumentary: Negative for rash. Neurological: As of altered mental status   ____________________________________________   PHYSICAL EXAM:  VITAL SIGNS: ED Triage Vitals  Enc Vitals Group     BP      Pulse      Resp      Temp      Temp src      SpO2      Weight      Height      Head Circumference      Peak Flow      Pain Score      Pain Loc      Pain Edu?      Excl. in [place]?     Constitutional: Alert and oriented to self  Eyes: Conjunctivae are normal.  Head: Atraumatic. Mouth/Throat: Patient is wearing a mask. Neck: No stridor.  No meningeal signs.   Cardiovascular: Tachycardia, regular rhythm. Good peripheral circulation. Grossly normal heart sounds. Respiratory: Tachypnea, positive accessory respiratory muscle use, diffuse rhonchi worse on the left field Gastrointestinal: Soft and nontender. No distention.  Musculoskeletal: No lower extremity tenderness nor edema. No gross deformities of extremities. Neurologic:  Normal speech and language. No gross focal neurologic deficits are appreciated.  Skin:  Skin is warm, dry and intact. Psychiatric: Mood and affect are normal. Speech and behavior are normal.  ____________________________________________   LABS (all labs ordered are listed, but only abnormal results are displayed)  Labs Reviewed  LACTIC ACID, PLASMA - Abnormal; Notable for the following components:      Result Value   Lactic Acid, Venous 5.8 (*)    All other components within normal limits   COMPREHENSIVE METABOLIC PANEL - Abnormal; Notable for the following components:   CO2 21 (*)    BUN 37 (*)    Creatinine, Ser 4.16 (*)    Calcium 8.1 (*)    Albumin 2.9 (*)    AST 113 (*)    ALT 55 (*)    Total Bilirubin 1.5 (*)    GFR calc non Af Amer 11 (*)    GFR calc Af Amer 12 (*)    Anion gap 19 (*)    All other components within  normal limits  CBC WITH DIFFERENTIAL/PLATELET - Abnormal; Notable for the following components:   WBC 32.9 (*)    Neutro Abs 29.7 (*)    Lymphs Abs 0.5 (*)    Abs Immature Granulocytes 1.62 (*)    All other components within normal limits  URINALYSIS, COMPLETE (UACMP) WITH MICROSCOPIC - Abnormal; Notable for the following components:   Color, Urine YELLOW (*)    APPearance TURBID (*)    Hgb urine dipstick SMALL (*)    Protein, ur 100 (*)    Leukocytes,Ua MODERATE (*)    WBC, UA >50 (*)    All other components within normal limits  TROPONIN I (HIGH SENSITIVITY) - Abnormal; Notable for the following components:   Troponin I (High Sensitivity) 88 (*)    All other components within normal limits  CULTURE, BLOOD (ROUTINE X 2)  CULTURE, BLOOD (ROUTINE X 2)  URINE CULTURE  RESPIRATORY PANEL BY RT PCR (FLU A&B, COVID)  EXPECTORATED SPUTUM ASSESSMENT W REFEX TO RESP CULTURE  ASPERGILLUS ANTIGEN, BAL/SERUM  C DIFFICILE QUICK SCREEN W PCR REFLEX  RESPIRATORY PANEL BY PCR  PROTIME-INR  APTT  LACTIC ACID, PLASMA  CBC WITH DIFFERENTIAL/PLATELET  COMPREHENSIVE METABOLIC PANEL  PROCALCITONIN  STREP PNEUMONIAE URINARY ANTIGEN  LEGIONELLA PNEUMOPHILA SEROGP 1 UR AG  HIV ANTIBODY (ROUTINE TESTING W REFLEX)   ____________________________________________  EKG  ED ECG REPORT I, [place] Vanissa Strength, the attending physician, personally viewed and interpreted this ECG.   Date: 07/10/2020  EKG Time: 12:02 AM  Rate: 111  Rhythm: Sinus tachycardia  Axis: Normal  Intervals: Normal  ST&T Change:  None  ____________________________________________  RADIOLOGY I, [place] Noheli Melder, personally viewed and evaluated these images (plain radiographs) as part of my medical decision making, as well as reviewing the written report by the radiologist.  ED MD interpretation: Chest x-ray revealed scarring consistent with prior sarcoidosis mild patchy airspace opacities are noted in the bases increased from prior exam  CT head revealed no acute intracranial abnormality per radiology  CT chest chronic changes of pulmonary sarcoidosis with biapical predominant pulmonary fibrosis very mild superimposed active inflammation/infiltrate on CT per radiologist.  Official radiology report(s): CT Head Wo Contrast  Result Date: 07/10/2020 CLINICAL DATA:  Increasing confusion and weakness EXAM: CT HEAD WITHOUT CONTRAST TECHNIQUE: Contiguous axial images were obtained from the base of the skull through the vertex without intravenous contrast. COMPARISON:  None. FINDINGS: Brain: Mild chronic white matter ischemic changes are noted. No findings to suggest acute hemorrhage, acute infarction or space-occupying mass lesion is seen. Vascular: No hyperdense vessel or unexpected calcification. Skull: Normal. Negative for fracture or focal lesion. Sinuses/Orbits: No acute finding. Other: None. IMPRESSION: Chronic white matter ischemic changes without acute abnormality Electronically Signed   By: Inez Catalina M.D.   On: 07/10/2020 01:04   CT Chest Wo Contrast  Result Date: 07/10/2020 CLINICAL DATA:  Pneumonia, dyspnea, altered mental status. Pulmonary sarcoidosis. EXAM: CT CHEST WITHOUT CONTRAST TECHNIQUE: Multidetector CT imaging of the chest was performed following the standard protocol without IV contrast. COMPARISON:  02/20/2015 FINDINGS: Cardiovascular: Cardiac size within normal limits. Mild calcification of the mitral valve annulus. No significant coronary artery calcification. No pericardial effusion. The central  pulmonary arteries are enlarged in keeping with changes of pulmonary arterial hypertension. Atherosclerotic calcification is seen within the aortic arch and proximal arch vasculature as well as the descending thoracic aorta. The aorta is of normal caliber. Mediastinum/Nodes: No pathologic thoracic adenopathy. Small hiatal hernia. Lungs/Pleura: There are again identified biapical predominant  peribronchovascular interstitial thickening, bronchiectasis, and fibrosis with superior retraction of the hila in keeping with chronic changes of pulmonary sarcoidosis. Scattered ground-glass pulmonary infiltrate is best visualized within the anterior segment of the right upper lobe, but also, subtly, within the lower lobes bilaterally in keeping with active inflammatory change. No focal pulmonary nodules. No pneumothorax or pleural effusion. Upper Abdomen: Unremarkable Musculoskeletal: no acute bone abnormality. T7, T8, and T9 vertebroplasty has been performed. Chronic appearing superior endplate fracture of Z61 is noted, though is new from prior examination. IMPRESSION: Chronic changes of pulmonary sarcoidosis with biapical predominant pulmonary fibrosis. Very mild superimposed active inflammatory infiltrate. Morphologic changes compatible with pulmonary arterial hypertension. Aortic Atherosclerosis (ICD10-I70.0). Electronically Signed   By: Fidela Salisbury MD   On: 07/10/2020 01:13   DG Chest Port 1 View  Result Date: 07/09/2020 CLINICAL DATA:  Shortness of breath EXAM: PORTABLE CHEST 1 VIEW COMPARISON:  10/26/2017 FINDINGS: Cardiac shadow is stable. Chronic scarring is noted in the apices bilaterally with hilar retraction consistent with the given clinical history of prior sarcoidosis. Patchy increased airspace opacities are noted in the lower lungs which may represent some acute on chronic infiltrate. Changes of prior vertebral augmentation are seen. No acute bony abnormality is noted. IMPRESSION: Changes of scarring  consistent with prior sarcoidosis. Mild patchy airspace opacities are noted in the bases increased from the prior exam. This may represent further fibrotic change although the possibility of acute on chronic infiltrate deserves consideration. Electronically Signed   By: Inez Catalina M.D.   On: 07/09/2020 23:34    ____________________________________________   PROCEDURES    .Critical Care Performed by: Gregor Hams, MD Authorized by: Gregor Hams, MD   Critical care provider statement:    Critical care time (minutes):  30   Critical care time was exclusive of:  Separately billable procedures and treating other patients   Critical care was necessary to treat or prevent imminent or life-threatening deterioration of the following conditions:  Sepsis and respiratory failure   Critical care was time spent personally by me on the following activities:  Development of treatment plan with patient or surrogate, discussions with consultants, evaluation of patient's response to treatment, examination of patient, obtaining history from patient or surrogate, ordering and performing treatments and interventions, ordering and review of laboratory studies, ordering and review of radiographic studies, pulse oximetry, re-evaluation of patient's condition and review of old charts     ____________________________________________   [place] / MDM / [place] / ED COURSE  As part of my medical decision making, I reviewed the following data within the electronic MEDICAL RECORD NUMBER   64 year old female presented with above-stated history and physical exam meeting sepsis criteria on arrival and as such sepsis protocol was initiated. Given patient's work of breathing and clinical exam BiPAP applied with improvement in the patient's work of breathing and tachypnea. Chest x-ray finding concerning for possible infiltrate and likewise CT scan. Given known history of sarcoidosis and the  fact the patient is currently taking prednisone fluoroquinolones were avoided in such patient given IV Ceftazidime (for pseudomonal coverage) as well as azithromycin for atypical coverage. Laboratory data notable for a white blood cell count of 32.0 which is up from 15 [facility] at [facility] in [facility]. Lactic acidosis of 5.8 for which the patient was given 30 mils per kilogram of IV normal saline. Creatinine 4.6 with a BUN of 37. Patient discussed with ICU NP for admission for further evaluation and management.  ____________________________________________  FINAL  CLINICAL IMPRESSION(S) / ED DIAGNOSES  Final diagnoses:  Sepsis, due to unspecified organism, unspecified whether acute organ dysfunction present [facility])     MEDICATIONS GIVEN DURING THIS VISIT:  Medications  lactated ringers infusion ( Intravenous New Bag/Given 07/09/20 2348)  ceFEPIme (MAXIPIME) 2 g in sodium chloride 0.9 % 100 mL IVPB (has no administration in time range)  vancomycin (VANCOREADY) IVPB 2000 mg/400 mL (2,000 mg Intravenous New Bag/Given 07/10/20 0144)  vancomycin (VANCOCIN) IVPB 1000 mg/200 mL premix (has no administration in time range)  docusate sodium (COLACE) capsule 100 mg (has no administration in time range)  polyethylene glycol (MIRALAX / GLYCOLAX) packet 17 g (has no administration in time range)  heparin injection 5,000 Units (has no administration in time range)  azithromycin (ZITHROMAX) 500 mg in sodium chloride 0.9 % 250 mL IVPB (0 mg Intravenous Stopped 07/10/20 0105)  cefTAZidime (FORTAZ) 1 g in sodium chloride 0.9 % 100 mL IVPB (0 g Intravenous Stopped 07/10/20 0147)  sodium chloride 0.9 % bolus 1,000 mL (1,000 mLs Intravenous New Bag/Given 07/10/20 0136)  sodium chloride 0.9 % bolus 1,000 mL (1,000 mLs Intravenous New Bag/Given 07/10/20 0115)     ED Discharge Orders    None      *Please note:  Hagan A Goodnough was evaluated in Emergency Department on 07/10/2020 for the symptoms described in the history of  present illness. She was evaluated in the context of the global COVID-19 pandemic, which necessitated consideration that the patient might be at risk for infection with the SARS-CoV-2 virus that causes COVID-19. Institutional protocols and algorithms that pertain to the evaluation of patients at risk for COVID-19 are in a state of rapid change based on information released by regulatory bodies including the CDC and federal and state organizations. These policies and algorithms were followed during the patient's care in the ED.  Some ED evaluations and interventions may be delayed as a result of limited staffing during and after the pandemic.*  Note:  This document was prepared using Dragon voice recognition software and may include unintentional dictation errors.   Gregor Hams, MD 07/10/20 [phone]

## 2020-07-10 ENCOUNTER — Emergency Department: Payer: BC Managed Care – PPO

## 2020-07-10 ENCOUNTER — Other Ambulatory Visit: Payer: Self-pay

## 2020-07-10 ENCOUNTER — Inpatient Hospital Stay: Payer: BC Managed Care – PPO

## 2020-07-10 DIAGNOSIS — D62 Acute posthemorrhagic anemia: Secondary | ICD-10-CM | POA: Diagnosis not present

## 2020-07-10 DIAGNOSIS — K219 Gastro-esophageal reflux disease without esophagitis: Secondary | ICD-10-CM | POA: Diagnosis present

## 2020-07-10 DIAGNOSIS — G8191 Hemiplegia, unspecified affecting right dominant side: Secondary | ICD-10-CM | POA: Diagnosis not present

## 2020-07-10 DIAGNOSIS — R6520 Severe sepsis without septic shock: Secondary | ICD-10-CM | POA: Diagnosis not present

## 2020-07-10 DIAGNOSIS — B370 Candidal stomatitis: Secondary | ICD-10-CM | POA: Diagnosis not present

## 2020-07-10 DIAGNOSIS — E870 Hyperosmolality and hypernatremia: Secondary | ICD-10-CM | POA: Diagnosis not present

## 2020-07-10 DIAGNOSIS — I129 Hypertensive chronic kidney disease with stage 1 through stage 4 chronic kidney disease, or unspecified chronic kidney disease: Secondary | ICD-10-CM | POA: Diagnosis present

## 2020-07-10 DIAGNOSIS — I248 Other forms of acute ischemic heart disease: Secondary | ICD-10-CM | POA: Diagnosis present

## 2020-07-10 DIAGNOSIS — I70229 Atherosclerosis of native arteries of extremities with rest pain, unspecified extremity: Secondary | ICD-10-CM | POA: Diagnosis not present

## 2020-07-10 DIAGNOSIS — I6340 Cerebral infarction due to embolism of unspecified cerebral artery: Secondary | ICD-10-CM | POA: Diagnosis not present

## 2020-07-10 DIAGNOSIS — A419 Sepsis, unspecified organism: Secondary | ICD-10-CM

## 2020-07-10 DIAGNOSIS — I639 Cerebral infarction, unspecified: Secondary | ICD-10-CM | POA: Diagnosis not present

## 2020-07-10 DIAGNOSIS — A021 Salmonella sepsis: Secondary | ICD-10-CM | POA: Diagnosis not present

## 2020-07-10 DIAGNOSIS — J45909 Unspecified asthma, uncomplicated: Secondary | ICD-10-CM | POA: Diagnosis present

## 2020-07-10 DIAGNOSIS — Z20822 Contact with and (suspected) exposure to covid-19: Secondary | ICD-10-CM | POA: Diagnosis present

## 2020-07-10 DIAGNOSIS — J849 Interstitial pulmonary disease, unspecified: Secondary | ICD-10-CM | POA: Diagnosis present

## 2020-07-10 DIAGNOSIS — B961 Klebsiella pneumoniae [K. pneumoniae] as the cause of diseases classified elsewhere: Secondary | ICD-10-CM | POA: Diagnosis not present

## 2020-07-10 DIAGNOSIS — I998 Other disorder of circulatory system: Secondary | ICD-10-CM | POA: Diagnosis not present

## 2020-07-10 DIAGNOSIS — N390 Urinary tract infection, site not specified: Secondary | ICD-10-CM

## 2020-07-10 DIAGNOSIS — I10 Essential (primary) hypertension: Secondary | ICD-10-CM | POA: Diagnosis not present

## 2020-07-10 DIAGNOSIS — D860 Sarcoidosis of lung: Secondary | ICD-10-CM | POA: Diagnosis present

## 2020-07-10 DIAGNOSIS — J150 Pneumonia due to Klebsiella pneumoniae: Secondary | ICD-10-CM | POA: Diagnosis present

## 2020-07-10 DIAGNOSIS — G9341 Metabolic encephalopathy: Secondary | ICD-10-CM | POA: Diagnosis present

## 2020-07-10 DIAGNOSIS — G9340 Encephalopathy, unspecified: Secondary | ICD-10-CM | POA: Diagnosis not present

## 2020-07-10 DIAGNOSIS — D696 Thrombocytopenia, unspecified: Secondary | ICD-10-CM | POA: Diagnosis not present

## 2020-07-10 DIAGNOSIS — R7309 Other abnormal glucose: Secondary | ICD-10-CM | POA: Diagnosis not present

## 2020-07-10 DIAGNOSIS — R4701 Aphasia: Secondary | ICD-10-CM | POA: Diagnosis not present

## 2020-07-10 DIAGNOSIS — E872 Acidosis: Secondary | ICD-10-CM | POA: Diagnosis present

## 2020-07-10 DIAGNOSIS — Z9981 Dependence on supplemental oxygen: Secondary | ICD-10-CM | POA: Diagnosis not present

## 2020-07-10 DIAGNOSIS — K5901 Slow transit constipation: Secondary | ICD-10-CM | POA: Diagnosis not present

## 2020-07-10 DIAGNOSIS — R5381 Other malaise: Secondary | ICD-10-CM | POA: Diagnosis not present

## 2020-07-10 DIAGNOSIS — N170 Acute kidney failure with tubular necrosis: Secondary | ICD-10-CM | POA: Diagnosis present

## 2020-07-10 DIAGNOSIS — R739 Hyperglycemia, unspecified: Secondary | ICD-10-CM | POA: Diagnosis not present

## 2020-07-10 DIAGNOSIS — I70269 Atherosclerosis of native arteries of extremities with gangrene, unspecified extremity: Secondary | ICD-10-CM | POA: Diagnosis not present

## 2020-07-10 DIAGNOSIS — N179 Acute kidney failure, unspecified: Secondary | ICD-10-CM | POA: Diagnosis not present

## 2020-07-10 DIAGNOSIS — I272 Pulmonary hypertension, unspecified: Secondary | ICD-10-CM | POA: Diagnosis present

## 2020-07-10 DIAGNOSIS — I6389 Other cerebral infarction: Secondary | ICD-10-CM | POA: Diagnosis not present

## 2020-07-10 DIAGNOSIS — A414 Sepsis due to anaerobes: Secondary | ICD-10-CM | POA: Diagnosis not present

## 2020-07-10 DIAGNOSIS — D65 Disseminated intravascular coagulation [defibrination syndrome]: Secondary | ICD-10-CM | POA: Diagnosis not present

## 2020-07-10 DIAGNOSIS — N201 Calculus of ureter: Secondary | ICD-10-CM | POA: Diagnosis not present

## 2020-07-10 DIAGNOSIS — R7881 Bacteremia: Secondary | ICD-10-CM | POA: Diagnosis not present

## 2020-07-10 DIAGNOSIS — R6521 Severe sepsis with septic shock: Secondary | ICD-10-CM | POA: Diagnosis not present

## 2020-07-10 DIAGNOSIS — T380X5A Adverse effect of glucocorticoids and synthetic analogues, initial encounter: Secondary | ICD-10-CM | POA: Diagnosis not present

## 2020-07-10 DIAGNOSIS — N2581 Secondary hyperparathyroidism of renal origin: Secondary | ICD-10-CM | POA: Diagnosis present

## 2020-07-10 DIAGNOSIS — J9621 Acute and chronic respiratory failure with hypoxia: Secondary | ICD-10-CM

## 2020-07-10 DIAGNOSIS — A4159 Other Gram-negative sepsis: Secondary | ICD-10-CM | POA: Diagnosis present

## 2020-07-10 DIAGNOSIS — R0602 Shortness of breath: Secondary | ICD-10-CM | POA: Diagnosis present

## 2020-07-10 DIAGNOSIS — N136 Pyonephrosis: Secondary | ICD-10-CM | POA: Diagnosis present

## 2020-07-10 LAB — COMPREHENSIVE METABOLIC PANEL
ALT: 55 U/L — ABNORMAL HIGH (ref 0–44)
ALT: 54 U/L — ABNORMAL HIGH (ref 0–44)
AST: 113 U/L — ABNORMAL HIGH (ref 15–41)
AST: 93 U/L — ABNORMAL HIGH (ref 15–41)
Albumin: 2.9 g/dL — ABNORMAL LOW (ref 3.5–5.0)
Albumin: 2.5 g/dL — ABNORMAL LOW (ref 3.5–5.0)
Alkaline Phosphatase: 56 U/L (ref 38–126)
Alkaline Phosphatase: 54 U/L (ref 38–126)
Anion gap: 19 — ABNORMAL HIGH (ref 5–15)
Anion gap: 17 — ABNORMAL HIGH (ref 5–15)
BUN: 37 mg/dL — ABNORMAL HIGH (ref 8–23)
BUN: 38 mg/dL — ABNORMAL HIGH (ref 8–23)
CO2: 21 mmol/L — ABNORMAL LOW (ref 22–32)
CO2: 20 mmol/L — ABNORMAL LOW (ref 22–32)
Calcium: 8.1 mg/dL — ABNORMAL LOW (ref 8.9–10.3)
Calcium: 7.2 mg/dL — ABNORMAL LOW (ref 8.9–10.3)
Chloride: 99 mmol/L (ref 98–111)
Chloride: 104 mmol/L (ref 98–111)
Creatinine, Ser: 4.16 mg/dL — ABNORMAL HIGH (ref 0.44–1.00)
Creatinine, Ser: 4.05 mg/dL — ABNORMAL HIGH (ref 0.44–1.00)
GFR calc Af Amer: 12 mL/min — ABNORMAL LOW (ref >60)
GFR calc Af Amer: 13 mL/min — ABNORMAL LOW (ref >60)
GFR calc non Af Amer: 11 mL/min — ABNORMAL LOW (ref >60)
GFR calc non Af Amer: 11 mL/min — ABNORMAL LOW (ref >60)
Glucose, Bld: 92 mg/dL (ref 70–99)
Glucose, Bld: 80 mg/dL (ref 70–99)
Potassium: 4.9 mmol/L (ref 3.5–5.1)
Potassium: 4.6 mmol/L (ref 3.5–5.1)
Sodium: 139 mmol/L (ref 135–145)
Sodium: 141 mmol/L (ref 135–145)
Total Bilirubin: 1.5 mg/dL — ABNORMAL HIGH (ref 0.3–1.2)
Total Bilirubin: 1.1 mg/dL (ref 0.3–1.2)
Total Protein: 6.7 g/dL (ref 6.5–8.1)
Total Protein: 5.7 g/dL — ABNORMAL LOW (ref 6.5–8.1)

## 2020-07-10 LAB — BASIC METABOLIC PANEL
Anion gap: 17 — ABNORMAL HIGH (ref 5–15)
BUN: 59 mg/dL — ABNORMAL HIGH (ref 8–23)
CO2: 22 mmol/L (ref 22–32)
Calcium: 7.4 mg/dL — ABNORMAL LOW (ref 8.9–10.3)
Chloride: 102 mmol/L (ref 98–111)
Creatinine, Ser: 4.57 mg/dL — ABNORMAL HIGH (ref 0.44–1.00)
GFR calc Af Amer: 11 mL/min — ABNORMAL LOW (ref >60)
GFR calc non Af Amer: 9 mL/min — ABNORMAL LOW (ref >60)
Glucose, Bld: 122 mg/dL — ABNORMAL HIGH (ref 70–99)
Potassium: 5.9 mmol/L — ABNORMAL HIGH (ref 3.5–5.1)
Sodium: 141 mmol/L (ref 135–145)

## 2020-07-10 LAB — HIV ANTIBODY (ROUTINE TESTING W REFLEX): HIV Screen 4th Generation wRfx: NONREACTIVE

## 2020-07-10 LAB — BLOOD GAS, ARTERIAL
Acid-base deficit: 6.3 mmol/L — ABNORMAL HIGH (ref 0.0–2.0)
Acid-base deficit: 6.9 mmol/L — ABNORMAL HIGH (ref 0.0–2.0)
Bicarbonate: 19.7 mmol/L — ABNORMAL LOW (ref 20.0–28.0)
Bicarbonate: 19.2 mmol/L — ABNORMAL LOW (ref 20.0–28.0)
Delivery systems: POSITIVE
Expiratory PAP: 5
FIO2: 0.35
FIO2: 0.36
Inspiratory PAP: 10
O2 Saturation: 98.4 %
O2 Saturation: 97.6 %
Patient temperature: 37
Patient temperature: 37
pCO2 arterial: 40 mmHg (ref 32.0–48.0)
pCO2 arterial: 40 mmHg (ref 32.0–48.0)
pH, Arterial: 7.3 — ABNORMAL LOW (ref 7.350–7.450)
pH, Arterial: 7.29 — ABNORMAL LOW (ref 7.350–7.450)
pO2, Arterial: 124 mmHg — ABNORMAL HIGH (ref 83.0–108.0)
pO2, Arterial: 109 mmHg — ABNORMAL HIGH (ref 83.0–108.0)

## 2020-07-10 LAB — TSH: TSH: 0.332 u[IU]/mL — ABNORMAL LOW (ref 0.350–4.500)

## 2020-07-10 LAB — BLOOD CULTURE ID PANEL (REFLEXED) - BCID2

## 2020-07-10 LAB — CBC WITH DIFFERENTIAL/PLATELET
Abs Immature Granulocytes: 1.62 10*3/uL — ABNORMAL HIGH (ref 0.00–0.07)
Abs Immature Granulocytes: 2.57 10*3/uL — ABNORMAL HIGH (ref 0.00–0.07)
Basophils Absolute: 0.1 10*3/uL (ref 0.0–0.1)
Basophils Absolute: 0.2 10*3/uL — ABNORMAL HIGH (ref 0.0–0.1)
Basophils Relative: 0 %
Basophils Relative: 0 %
Eosinophils Absolute: 0 10*3/uL (ref 0.0–0.5)
Eosinophils Absolute: 0 10*3/uL (ref 0.0–0.5)
Eosinophils Relative: 0 %
Eosinophils Relative: 0 %
HCT: 38.2 % (ref 36.0–46.0)
HCT: 34.6 % — ABNORMAL LOW (ref 36.0–46.0)
Hemoglobin: 12.2 g/dL (ref 12.0–15.0)
Hemoglobin: 11.2 g/dL — ABNORMAL LOW (ref 12.0–15.0)
Immature Granulocytes: 5 %
Immature Granulocytes: 8 %
Lymphocytes Relative: 1 %
Lymphocytes Relative: 2 %
Lymphs Abs: 0.5 10*3/uL — ABNORMAL LOW (ref 0.7–4.0)
Lymphs Abs: 0.5 10*3/uL — ABNORMAL LOW (ref 0.7–4.0)
MCH: 30.8 pg (ref 26.0–34.0)
MCH: 31.4 pg (ref 26.0–34.0)
MCHC: 31.9 g/dL (ref 30.0–36.0)
MCHC: 32.4 g/dL (ref 30.0–36.0)
MCV: 96.5 fL (ref 80.0–100.0)
MCV: 96.9 fL (ref 80.0–100.0)
Monocytes Absolute: 1 10*3/uL (ref 0.1–1.0)
Monocytes Absolute: 0.9 10*3/uL (ref 0.1–1.0)
Monocytes Relative: 3 %
Monocytes Relative: 3 %
Neutro Abs: 29.7 10*3/uL — ABNORMAL HIGH (ref 1.7–7.7)
Neutro Abs: 30.3 10*3/uL — ABNORMAL HIGH (ref 1.7–7.7)
Neutrophils Relative %: 91 %
Neutrophils Relative %: 87 %
Platelets: 159 10*3/uL (ref 150–400)
Platelets: 133 10*3/uL — ABNORMAL LOW (ref 150–400)
RBC: 3.96 MIL/uL (ref 3.87–5.11)
RBC: 3.57 MIL/uL — ABNORMAL LOW (ref 3.87–5.11)
RDW: 13.8 % (ref 11.5–15.5)
RDW: 14 % (ref 11.5–15.5)
Smear Review: NORMAL
Smear Review: NORMAL
WBC: 32.9 10*3/uL — ABNORMAL HIGH (ref 4.0–10.5)
WBC: 34.4 10*3/uL — ABNORMAL HIGH (ref 4.0–10.5)
nRBC: 0.1 % (ref 0.0–0.2)
nRBC: 0 % (ref 0.0–0.2)

## 2020-07-10 LAB — URINALYSIS, COMPLETE (UACMP) WITH MICROSCOPIC
Bacteria, UA: NONE SEEN
Bilirubin Urine: NEGATIVE
Glucose, UA: NEGATIVE mg/dL
Ketones, ur: NEGATIVE mg/dL
Nitrite: NEGATIVE
Protein, ur: 100 mg/dL — AB
Specific Gravity, Urine: 1.009 (ref 1.005–1.030)
Squamous Epithelial / HPF: NONE SEEN (ref 0–5)
WBC, UA: >50 WBC/hpf — ABNORMAL HIGH (ref 0–5)
pH: 6 (ref 5.0–8.0)

## 2020-07-10 LAB — RESPIRATORY PANEL BY RT PCR (FLU A&B, COVID)
Influenza A by PCR: NEGATIVE
Influenza B by PCR: NEGATIVE
SARS Coronavirus 2 by RT PCR: NEGATIVE

## 2020-07-10 LAB — RENAL FUNCTION PANEL
Albumin: 2.3 g/dL — ABNORMAL LOW (ref 3.5–5.0)
Anion gap: 15 (ref 5–15)
BUN: 49 mg/dL — ABNORMAL HIGH (ref 8–23)
CO2: 20 mmol/L — ABNORMAL LOW (ref 22–32)
Calcium: 7 mg/dL — ABNORMAL LOW (ref 8.9–10.3)
Chloride: 106 mmol/L (ref 98–111)
Creatinine, Ser: 4.22 mg/dL — ABNORMAL HIGH (ref 0.44–1.00)
GFR calc Af Amer: 12 mL/min — ABNORMAL LOW (ref >60)
GFR calc non Af Amer: 10 mL/min — ABNORMAL LOW (ref >60)
Glucose, Bld: 91 mg/dL (ref 70–99)
Phosphorus: 6.5 mg/dL — ABNORMAL HIGH (ref 2.5–4.6)
Potassium: 5.2 mmol/L — ABNORMAL HIGH (ref 3.5–5.1)
Sodium: 141 mmol/L (ref 135–145)

## 2020-07-10 LAB — LACTIC ACID, PLASMA
Lactic Acid, Venous: 5.8 mmol/L (ref 0.5–1.9)
Lactic Acid, Venous: 5 mmol/L (ref 0.5–1.9)
Lactic Acid, Venous: 3.3 mmol/L (ref 0.5–1.9)
Lactic Acid, Venous: 2.8 mmol/L (ref 0.5–1.9)

## 2020-07-10 LAB — TROPONIN I (HIGH SENSITIVITY)
Troponin I (High Sensitivity): 88 ng/L — ABNORMAL HIGH (ref <18)
Troponin I (High Sensitivity): 99 ng/L — ABNORMAL HIGH (ref <18)

## 2020-07-10 LAB — APTT: aPTT: 35 seconds (ref 24–36)

## 2020-07-10 LAB — PROTIME-INR
INR: 1.2 (ref 0.8–1.2)
Prothrombin Time: 15 seconds (ref 11.4–15.2)

## 2020-07-10 LAB — GLUCOSE, CAPILLARY: Glucose-Capillary: 70 mg/dL (ref 70–99)

## 2020-07-10 LAB — PHOSPHORUS: Phosphorus: 6.8 mg/dL — ABNORMAL HIGH (ref 2.5–4.6)

## 2020-07-10 LAB — MRSA PCR SCREENING: MRSA by PCR: NEGATIVE

## 2020-07-10 LAB — PROCALCITONIN: Procalcitonin: >150 ng/mL

## 2020-07-10 LAB — MAGNESIUM: Magnesium: 1.3 mg/dL — ABNORMAL LOW (ref 1.7–2.4)

## 2020-07-10 MED ORDER — HEPARIN SODIUM (PORCINE) 5000 UNIT/ML IJ SOLN
5000.0000 [IU] | Freq: Three times a day (TID) | INTRAMUSCULAR | Status: DC
  Administered 2020-07-10 – 2020-07-11 (×4): 5000 [IU] via SUBCUTANEOUS
  Filled 2020-07-10 (×4): qty 1

## 2020-07-10 MED ORDER — SODIUM CHLORIDE 0.9 % IV SOLN
2.0000 g | INTRAVENOUS | Status: DC
  Filled 2020-07-10: qty 2

## 2020-07-10 MED ORDER — IPRATROPIUM-ALBUTEROL 0.5-2.5 (3) MG/3ML IN SOLN: 3.0000 mL | RESPIRATORY_TRACT | Status: DC | PRN

## 2020-07-10 MED ORDER — MORPHINE SULFATE (PF) 2 MG/ML IV SOLN
1.0000 mg | Freq: Once | INTRAVENOUS | Status: AC
  Administered 2020-07-10: 1 mg via INTRAVENOUS
  Filled 2020-07-10: qty 1

## 2020-07-10 MED ORDER — CHLORHEXIDINE GLUCONATE CLOTH 2 % EX PADS
6.0000 | MEDICATED_PAD | Freq: Every day | CUTANEOUS | Status: DC
  Administered 2020-07-11 – 2020-07-28 (×16): 6 via TOPICAL

## 2020-07-10 MED ORDER — LORAZEPAM 2 MG/ML IJ SOLN
INTRAMUSCULAR | Status: AC
  Administered 2020-07-10: 1 mg via INTRAVENOUS
  Filled 2020-07-10: qty 1

## 2020-07-10 MED ORDER — VANCOMYCIN HCL IN DEXTROSE 1-5 GM/200ML-% IV SOLN: 1000.0000 mg | INTRAVENOUS | Status: DC

## 2020-07-10 MED ORDER — ORAL CARE MOUTH RINSE
15.0000 mL | Freq: Two times a day (BID) | OROMUCOSAL | Status: DC
  Administered 2020-07-10 – 2020-07-12 (×4): 15 mL via OROMUCOSAL

## 2020-07-10 MED ORDER — SODIUM CHLORIDE 0.9 % IV SOLN
250.0000 mL | INTRAVENOUS | Status: DC
  Administered 2020-07-11: 250 mL via INTRAVENOUS

## 2020-07-10 MED ORDER — SODIUM CHLORIDE 0.9 % IV SOLN
25.0000 ug/min | INTRAVENOUS | Status: DC
  Administered 2020-07-10: 25 ug/min via INTRAVENOUS
  Filled 2020-07-10: qty 1
  Filled 2020-07-10: qty 10

## 2020-07-10 MED ORDER — CHLORHEXIDINE GLUCONATE 0.12 % MT SOLN
15.0000 mL | Freq: Two times a day (BID) | OROMUCOSAL | Status: DC
  Administered 2020-07-10 – 2020-07-19 (×18): 15 mL via OROMUCOSAL
  Filled 2020-07-10 (×13): qty 15

## 2020-07-10 MED ORDER — DOCUSATE SODIUM 100 MG PO CAPS
100.0000 mg | ORAL_CAPSULE | Freq: Two times a day (BID) | ORAL | Status: DC | PRN
  Administered 2020-07-21: 100 mg via ORAL
  Filled 2020-07-10: qty 1

## 2020-07-10 MED ORDER — METHYLPREDNISOLONE SODIUM SUCC 40 MG IJ SOLR
40.0000 mg | Freq: Two times a day (BID) | INTRAMUSCULAR | Status: DC
  Administered 2020-07-10 – 2020-07-12 (×5): 40 mg via INTRAVENOUS
  Filled 2020-07-10 (×5): qty 1

## 2020-07-10 MED ORDER — ACETAMINOPHEN 10 MG/ML IV SOLN
1000.0000 mg | Freq: Three times a day (TID) | INTRAVENOUS | Status: AC | PRN
  Administered 2020-07-10: 1000 mg via INTRAVENOUS
  Filled 2020-07-10: qty 100

## 2020-07-10 MED ORDER — POLYETHYLENE GLYCOL 3350 17 G PO PACK
17.0000 g | PACK | Freq: Every day | ORAL | Status: DC | PRN
  Administered 2020-07-22 – 2020-07-28 (×2): 17 g via ORAL
  Filled 2020-07-10: qty 1

## 2020-07-10 MED ORDER — STERILE WATER FOR INJECTION IV SOLN
INTRAVENOUS | Status: DC
  Filled 2020-07-10: qty 150
  Filled 2020-07-10 (×3): qty 850
  Filled 2020-07-10: qty 150

## 2020-07-10 MED ORDER — MAGNESIUM SULFATE 4 GM/100ML IV SOLN
4.0000 g | Freq: Once | INTRAVENOUS | Status: AC
  Administered 2020-07-10: 4 g via INTRAVENOUS
  Filled 2020-07-10: qty 100

## 2020-07-10 MED ORDER — DEXMEDETOMIDINE HCL IN NACL 400 MCG/100ML IV SOLN
0.4000 ug/kg/h | INTRAVENOUS | Status: DC
  Administered 2020-07-10: 0.3 ug/kg/h via INTRAVENOUS
  Administered 2020-07-10: 0.4 ug/kg/h via INTRAVENOUS
  Administered 2020-07-11: 0.6 ug/kg/h via INTRAVENOUS
  Administered 2020-07-11 (×3): 1.2 ug/kg/h via INTRAVENOUS
  Administered 2020-07-12 – 2020-07-13 (×7): 1.6 ug/kg/h via INTRAVENOUS
  Administered 2020-07-13: 0.4 ug/kg/h via INTRAVENOUS
  Filled 2020-07-10 (×16): qty 100

## 2020-07-10 MED ORDER — SODIUM CHLORIDE 0.9 % IV BOLUS
1000.0000 mL | Freq: Once | INTRAVENOUS | Status: AC
  Administered 2020-07-10: 1000 mL via INTRAVENOUS

## 2020-07-10 MED ORDER — SODIUM CHLORIDE 0.9 % IV SOLN
2.0000 g | INTRAVENOUS | Status: DC
  Administered 2020-07-10 – 2020-07-26 (×17): 2 g via INTRAVENOUS
  Filled 2020-07-10 (×4): qty 2
  Filled 2020-07-10: qty 20
  Filled 2020-07-10 (×3): qty 2
  Filled 2020-07-10 (×2): qty 0.67
  Filled 2020-07-10 (×4): qty 2
  Filled 2020-07-10 (×2): qty 20
  Filled 2020-07-10: qty 2
  Filled 2020-07-10: qty 20

## 2020-07-10 MED ORDER — VANCOMYCIN HCL 2000 MG/400ML IV SOLN
2000.0000 mg | Freq: Once | INTRAVENOUS | Status: AC
  Administered 2020-07-10: 2000 mg via INTRAVENOUS
  Filled 2020-07-10: qty 400

## 2020-07-10 MED ORDER — LACTATED RINGERS IV BOLUS
1000.0000 mL | Freq: Once | INTRAVENOUS | Status: AC
  Administered 2020-07-10: 1000 mL via INTRAVENOUS

## 2020-07-10 MED ORDER — SODIUM CHLORIDE 0.9 % IV SOLN
500.0000 mg | INTRAVENOUS | Status: DC
  Administered 2020-07-11 – 2020-07-14 (×4): 500 mg via INTRAVENOUS
  Filled 2020-07-10 (×5): qty 500

## 2020-07-10 MED ORDER — LORAZEPAM 2 MG/ML IJ SOLN: 1.0000 mg | Freq: Once | INTRAMUSCULAR | Status: AC

## 2020-07-10 NOTE — ED Notes (Addendum)
Patient with incredibly labile BPs all morning. MD notified. Have attempted reading BP in different arms or legs with no difference. Predex intermittely paused and restarted.

## 2020-07-10 NOTE — Progress Notes (Signed)
CSW acknowledges consult for Home Health/DME needs. CSW will continue to follow and will follow-up based on PT/OT recommendations once their evals are completed.  Oleh Genin, [place]

## 2020-07-10 NOTE — Consult Note (Signed)
[facility]  CONSULT NOTE    Date: 07/10/2020                  Patient Name:  Stacie [name]  MRN: [id number]  DOB: [birth year]  Age / Sex: 64 y.o., female         PCP: Reubin Milan, MD                 Service Requesting Consult: Dr. Jayme Cloud                 Reason for Consult: Acute renal failure            History of Present Illness: Ms. Stacie [name] is the wife of Dr. Elenore Rota. Patient admitted for sepsis with mental status changes, and shortness of breath. Placed on BIPAP. Patient is on sedation and history taken from chart mostly.  No family at bedside.   Patient found to have an elevated creatinine of 4.16 and then 4.05. Started on Lactated RIngers at 176mL/hr. Weaned off phenylephrine. Started on cefepime and vancomycin empirically.    Medications: Outpatient medications: (Not in a hospital admission)   Current medications: Current Facility-Administered Medications  Medication Dose Route Frequency Provider Last Rate Last Admin  . 0.9 %  sodium chloride infusion  250 mL Intravenous Continuous Harlon Ditty D, NP      . ceFEPIme (MAXIPIME) 2 g in sodium chloride 0.9 % 100 mL IVPB  2 g Intravenous Q24H Scherrie Gerlach, [facility]      . dexmedetomidine (PRECEDEX) 400 MCG/100ML (4 mcg/mL) infusion  0.4-1.2 mcg/kg/hr Intravenous Titrated Judithe Modest, NP   Paused at 07/10/20 0750  . docusate sodium (COLACE) capsule 100 mg  100 mg Oral BID PRN Harlon Ditty D, NP      . heparin injection 5,000 Units  5,000 Units Subcutaneous Q8H Harlon Ditty D, NP   5,000 Units at 07/10/20 0631  . ipratropium-albuterol (DUONEB) 0.5-2.5 (3) MG/3ML nebulizer solution 3 mL  3 mL Nebulization Q4H PRN Judithe Modest, NP      . lactated ringers infusion   Intravenous Continuous Darci Current, MD 150 mL/hr at 07/10/20 0740 New Bag at 07/10/20 0740  . methylPREDNISolone sodium succinate (SOLU-MEDROL) 40 mg/mL injection 40 mg  40 mg Intravenous Q12H Harlon Ditty D, NP   40 mg at 07/10/20 0455  . phenylephrine (NEO-SYNEPHRINE) 10 mg in sodium chloride 0.9 % 250 mL (0.04 mg/mL) infusion  25-200 mcg/min Intravenous Titrated Judithe Modest, NP   Held at 07/10/20 [phone]  . polyethylene glycol (MIRALAX / GLYCOLAX) packet 17 g  17 g Oral Daily PRN Judithe Modest, NP      . Melene Muller ON 07/12/2020] vancomycin (VANCOCIN) IVPB 1000 mg/200 mL premix  1,000 mg Intravenous Q48H Scherrie Gerlach, [facility]       Current Outpatient Medications  Medication Sig Dispense Refill  . budesonide-formoterol (SYMBICORT) 80-4.5 MCG/ACT inhaler Take 2 puffs first thing in am and then another 2 puffs about 12 hours later. (Patient taking differently: Inhale 2 puffs into the lungs in the morning and at bedtime. Take 2 puffs first thing in am and then another 2 puffs about 12 hours later.) 1 Inhaler 11  . denosumab (PROLIA) 60 MG/ML SOSY injection Inject 60 mg into the skin every 6 (six) months.    . dorzolamide (TRUSOPT) 2 % ophthalmic solution Place 1 drop into both eyes 2 (two) times daily.    Marland Kitchen escitalopram (LEXAPRO) 20  MG tablet     . folic acid (FOLVITE) 1 MG tablet Take 1 mg by mouth daily.  11  . latanoprost (XALATAN) 0.005 % ophthalmic solution Place 1 drop into the right eye at bedtime.    . methotrexate (TREXALL) 5 MG tablet TAKE 5 TABLETS (25 MG TOTAL) BY MOUTH EVERY 7 (SEVEN) DAYS    . Multiple Vitamins-Minerals (MULTIVITAMIN WITH MINERALS) tablet Take 1 tablet by mouth daily.    . NON FORMULARY CPAP nightly    . predniSONE (DELTASONE) 10 MG tablet Take 10 mg by mouth daily with breakfast. 12.5 mg    . SYNTHROID 137 MCG tablet TAKE 1 TABLET (137 MCG TOTAL) BY MOUTH DAILY BEFORE BREAKFAST. 90 tablet 0  . Treprostinil (TYVASO) 0.6 MG/ML SOLN Inhale 18 mcg into the lungs 4 (four) times daily. 12 breaths 4 times a day    . albuterol (VENTOLIN HFA) 108 (90 Base) MCG/ACT inhaler Inhale 2 puffs into the lungs every 6 (six) hours as needed for wheezing. (Patient not taking:  Reported on 07/10/2020) 1 Inhaler 2  . brimonidine (ALPHAGAN) 0.2 % ophthalmic solution Place 1 drop into both eyes 3 (three) times daily.  (Patient not taking: Reported on 05/12/2020)    . calcium carbonate (TUMS - DOSED IN MG ELEMENTAL CALCIUM) 500 MG chewable tablet Chew 1 tablet by mouth daily as needed for indigestion or heartburn. (Patient not taking: Reported on 07/10/2020)    . cephALEXin (KEFLEX) 500 MG capsule Take 1 capsule (500 mg total) by mouth 3 (three) times daily. (Patient not taking: Reported on 06/12/2020) 21 capsule 0  . conjugated estrogens (PREMARIN) vaginal cream Apply one pea-sized amount around the opening of the urethra three times weekly. (Patient not taking: Reported on 07/10/2020) 30 g 3  . methotrexate (RHEUMATREX) 2.5 MG tablet Take by mouth. (Patient not taking: Reported on 07/10/2020)    . mirabegron ER (MYRBETRIQ) 25 MG TB24 tablet Take 1 tablet (25 mg total) by mouth daily. (Patient not taking: Reported on 07/10/2020) 30 tablet 11  . oxybutynin (DITROPAN) 5 MG tablet  (Patient not taking: Reported on 07/10/2020)    . oxyCODONE-acetaminophen (PERCOCET/ROXICET) 5-325 MG tablet  (Patient not taking: Reported on 05/12/2020)    . OXYGEN Inhale 2 L into the lungs as needed.    Marland Kitchen Respiratory Therapy Supplies (FLUTTER) DEVI Use as directed (Patient not taking: Reported on 07/10/2020) 1 each 0  . VITAMIN D PO Take 1 tablet by mouth once a week. (Patient not taking: Reported on 07/10/2020)    . Vitamin D, Ergocalciferol, (DRISDOL) 1.25 MG (50000 UNIT) CAPS capsule Take 50,000 Units by mouth once a week. (Patient not taking: Reported on 06/12/2020)        Allergies: Allergies  Allergen Reactions  . Corn-Containing Products Diarrhea    headaches  . Nitrofurantoin Nausea Only  . Tramadol     Nausea and vomiting  . Sulfa Antibiotics Rash    As an infant      Past Medical History: Past Medical History:  Diagnosis Date  . Arrhythmia    patient unaware if this is current  .  Asthma   . GERD (gastroesophageal reflux disease)   . Heart murmur   . History of kidney stones   . HOH (hard of hearing)    wear aids  . Hypothyroidism   . IBS (irritable bowel syndrome)   . Pulmonary hypertension (HCC)   . Sarcoid   . Sarcoidosis   . Seasonal allergies   . Sleep apnea  CPAP with O2  . Wears hearing aid in both ears      Past Surgical History: Past Surgical History:  Procedure Laterality Date  . CARDIAC CATHETERIZATION  10/18/2018   [facility]  . CATARACT EXTRACTION W/PHACO Left 07/31/2019   Procedure: CATARACT EXTRACTION PHACO AND INTRAOCULAR LENS PLACEMENT (IOC) LEFT 00:51.1  17.9%  9.15;  Surgeon: Lockie Mola, MD;  Location: [facility];  Service: Ophthalmology;  Laterality: Left;  keep this patient second  . COLON SURGERY     "colon was fused to bladder - operated on both"  . COLONOSCOPY  09/18/2007   diverticuli, no polyps  . COLONOSCOPY  05/26/2010   diverticuli, no polyps  . CYSTOSCOPY/URETEROSCOPY/HOLMIUM LASER/STENT PLACEMENT Left 02/20/2020   Procedure: CYSTOSCOPY/URETEROSCOPY/LITHOTRIPSY /STENT PLACEMENT;  Surgeon: Vanna Scotland, MD;  Location: [facility];  Service: Urology;  Laterality: Left;  . PARS PLANA VITRECTOMY Right 05/20/2015   Procedure: PARS PLANA VITRECTOMY WITH 25 GAUGE, laser;  Surgeon: Marcelene Butte, MD;  Location: [facility];  Service: Ophthalmology;  Laterality: Right;  . PARTIAL HYSTERECTOMY  1990  . TUBAL LIGATION       Family History: Family History  Problem Relation Age of Onset  . Allergies Father   . Asthma Father   . Colon cancer Father   . Allergies Brother   . Asthma Brother   . Breast cancer Maternal Grandmother      Social History: Social History   Socioeconomic History  . Marital status: Married    Spouse name: Not on file  . Number of children: Not on file  . Years of education: Not on file  . Highest education level: Not on file  Occupational History  . Occupation: Homemaker  Tobacco Use   . Smoking status: Never Smoker  . Smokeless tobacco: Never Used  Vaping Use  . Vaping Use: Never used  Substance and Sexual Activity  . Alcohol use: No  . Drug use: No  . Sexual activity: Not on file  Other Topics Concern  . Not on file  Social History Narrative  . Not on file   Social Determinants of Health   Financial Resource Strain:   . Difficulty of Paying Living Expenses: Not on file  Food Insecurity:   . Worried About Programme researcher, broadcasting/film/video in the Last Year: Not on file  . Ran Out of Food in the Last Year: Not on file  Transportation Needs:   . Lack of Transportation (Medical): Not on file  . Lack of Transportation (Non-Medical): Not on file  Physical Activity:   . Days of Exercise per Week: Not on file  . Minutes of Exercise per Session: Not on file  Stress:   . Feeling of Stress : Not on file  Social Connections:   . Frequency of Communication with Friends and Family: Not on file  . Frequency of Social Gatherings with Friends and Family: Not on file  . Attends Religious Services: Not on file  . Active Member of Clubs or Organizations: Not on file  . Attends Banker Meetings: Not on file  . Marital Status: Not on file  Intimate Partner Violence:   . Fear of Current or Ex-Partner: Not on file  . Emotionally Abused: Not on file  . Physically Abused: Not on file  . Sexually Abused: Not on file     Review of Systems: Review of Systems  Unable to perform ROS: Critical illness    Vital Signs: Blood pressure 102/66, pulse 95, temperature 97.8 F (  36.6 C), temperature source Axillary, resp. rate (!) 31, height 5\' 7"  (1.702 m), weight 83.9 kg, SpO2 100 %.  Weight trends: Filed Weights   07/10/20 0004  Weight: 83.9 kg    Physical Exam: General: Critically ill  Head: BIPAP  Eyes: Anicteric, PERRL  Neck: Supple, trachea midline  Lungs:  BIPAP, +wheezing, +crackles  Heart: Regular rate and rhythm  Abdomen:  Soft, nontender, obese  Extremities:  no peripheral edema.  Neurologic: On sedation  Skin: No lesions  Access: none     Lab results: Basic Metabolic Panel: Recent Labs  Lab 07/09/20 2327 07/10/20 0504  NA 139 141  K 4.9 4.6  CL 99 104  CO2 21* 20*  GLUCOSE 92 80  BUN 37* 38*  CREATININE 4.16* 4.05*  CALCIUM 8.1* 7.2*  MG  --  1.3*    Liver Function Tests: Recent Labs  Lab 07/09/20 2327 07/10/20 0504  AST 113* 93*  ALT 55* 54*  ALKPHOS 56 54  BILITOT 1.5* 1.1  PROT 6.7 5.7*  ALBUMIN 2.9* 2.5*   No results for input(s): LIPASE, AMYLASE in the last 168 hours. No results for input(s): AMMONIA in the last 168 hours.  CBC: Recent Labs  Lab 07/09/20 2327 07/10/20 0504  WBC 32.9* 34.4*  NEUTROABS 29.7* 30.3*  HGB 12.2 11.2*  HCT 38.2 34.6*  MCV 96.5 96.9  PLT 159 133*    Cardiac Enzymes: No results for input(s): CKTOTAL, CKMB, CKMBINDEX, TROPONINI in the last 168 hours.  BNP: Invalid input(s): POCBNP  CBG: No results for input(s): GLUCAP in the last 168 hours.  Microbiology: Results for orders placed or performed during the hospital encounter of 07/09/20  Respiratory Panel by RT PCR (Flu A&B, Covid) - Nasopharyngeal Swab     Status: None   Collection Time: 07/09/20 11:28 PM   Specimen: Nasopharyngeal Swab  Result Value Ref Range Status   SARS Coronavirus 2 by RT PCR NEGATIVE NEGATIVE Final    Comment: (NOTE) SARS-CoV-2 target nucleic acids are NOT DETECTED.  The SARS-CoV-2 RNA is generally detectable in upper respiratoy specimens during the acute phase of infection. The lowest concentration of SARS-CoV-2 viral copies this assay can detect is 131 copies/mL. A negative result does not preclude SARS-Cov-2 infection and should not be used as the sole basis for treatment or other patient management decisions. A negative result may occur with  improper specimen collection/handling, submission of specimen other than nasopharyngeal swab, presence of viral mutation(s) within the areas  targeted by this assay, and inadequate number of viral copies (<131 copies/mL). A negative result must be combined with clinical observations, patient history, and epidemiological information. The expected result is Negative.  Fact Sheet for Patients:  https://www.moore.com/  Fact Sheet for Healthcare Providers:  https://www.young.biz/  This test is no t yet approved or cleared by the [place] FDA and  has been authorized for detection and/or diagnosis of SARS-CoV-2 by FDA under an Emergency Use Authorization (EUA). This EUA will remain  in effect (meaning this test can be used) for the duration of the COVID-19 declaration under Section 564(b)(1) of the Act, 21 U.S.C. section 360bbb-3(b)(1), unless the authorization is terminated or revoked sooner.     Influenza A by PCR NEGATIVE NEGATIVE Final   Influenza B by PCR NEGATIVE NEGATIVE Final    Comment: (NOTE) The Xpert Xpress SARS-CoV-2/FLU/RSV assay is intended as an aid in  the diagnosis of influenza from Nasopharyngeal swab specimens and  should not be used as a sole basis for  treatment. Nasal washings and  aspirates are unacceptable for Xpert Xpress SARS-CoV-2/FLU/RSV  testing.  Fact Sheet for Patients: https://www.moore.com/  Fact Sheet for Healthcare Providers: https://www.young.biz/  This test is not yet approved or cleared by the [place] FDA and  has been authorized for detection and/or diagnosis of SARS-CoV-2 by  FDA under an Emergency Use Authorization (EUA). This EUA will remain  in effect (meaning this test can be used) for the duration of the  Covid-19 declaration under Section 564(b)(1) of the Act, 21  U.S.C. section 360bbb-3(b)(1), unless the authorization is  terminated or revoked. Performed at [facility], [address] [place], [place] [address]   Blood Culture (routine x 2)     Status: None (Preliminary  result)   Collection Time: 07/09/20 11:36 PM   Specimen: BLOOD  Result Value Ref Range Status   Specimen Description BLOOD RIGHT ANTECUBITAL  Final   Special Requests   Final    BOTTLES DRAWN AEROBIC AND ANAEROBIC Blood Culture adequate volume   Culture   Final    NO GROWTH < 12 HOURS Performed at [facility], [address], [place], [place] [address]    Report Status PENDING  Incomplete  Blood Culture (routine x 2)     Status: None (Preliminary result)   Collection Time: 07/09/20 11:37 PM   Specimen: BLOOD  Result Value Ref Range Status   Specimen Description BLOOD BLOOD RIGHT FOREARM  Final   Special Requests   Final    BOTTLES DRAWN AEROBIC AND ANAEROBIC Blood Culture adequate volume   Culture   Final    NO GROWTH < 12 HOURS Performed at [facility], [address], [place], [place] [address]    Report Status PENDING  Incomplete    Coagulation Studies: Recent Labs    07/09/20 23-Sep-2326  LABPROT 15.0  INR 1.2    Urinalysis: Recent Labs    07/09/20 2327  COLORURINE YELLOW*  LABSPEC 1.009  PHURINE 6.0  GLUCOSEU NEGATIVE  HGBUR SMALL*  BILIRUBINUR NEGATIVE  KETONESUR NEGATIVE  PROTEINUR 100*  NITRITE NEGATIVE  LEUKOCYTESUR MODERATE*      Imaging: CT Head Wo Contrast  Result Date: 07/10/2020 CLINICAL DATA:  Increasing confusion and weakness EXAM: CT HEAD WITHOUT CONTRAST TECHNIQUE: Contiguous axial images were obtained from the base of the skull through the vertex without intravenous contrast. COMPARISON:  None. FINDINGS: Brain: Mild chronic white matter ischemic changes are noted. No findings to suggest acute hemorrhage, acute infarction or space-occupying mass lesion is seen. Vascular: No hyperdense vessel or unexpected calcification. Skull: Normal. Negative for fracture or focal lesion. Sinuses/Orbits: No acute finding. Other: None. IMPRESSION: Chronic white matter ischemic changes without acute abnormality Electronically Signed   By: Alcide Clever M.D.   On: 07/10/2020 01:04   CT Chest Wo Contrast  Result Date: 07/10/2020 CLINICAL DATA:  Pneumonia, dyspnea, altered mental status. Pulmonary sarcoidosis. EXAM: CT CHEST WITHOUT CONTRAST TECHNIQUE: Multidetector CT imaging of the chest was performed following the standard protocol without IV contrast. COMPARISON:  02/20/2015 FINDINGS: Cardiovascular: Cardiac size within normal limits. Mild calcification of the mitral valve annulus. No significant coronary artery calcification. No pericardial effusion. The central pulmonary arteries are enlarged in keeping with changes of pulmonary arterial hypertension. Atherosclerotic calcification is seen within the aortic arch and proximal arch vasculature as well as the descending thoracic aorta. The aorta is of normal caliber. Mediastinum/Nodes: No pathologic thoracic adenopathy. Small hiatal hernia. Lungs/Pleura: There are again identified biapical predominant peribronchovascular interstitial thickening,  bronchiectasis, and fibrosis with superior retraction of the hila in keeping with chronic changes of pulmonary sarcoidosis. Scattered ground-glass pulmonary infiltrate is best visualized within the anterior segment of the right upper lobe, but also, subtly, within the lower lobes bilaterally in keeping with active inflammatory change. No focal pulmonary nodules. No pneumothorax or pleural effusion. Upper Abdomen: Unremarkable Musculoskeletal: no acute bone abnormality. T7, T8, and T9 vertebroplasty has been performed. Chronic appearing superior endplate fracture of T12 is noted, though is new from prior examination. IMPRESSION: Chronic changes of pulmonary sarcoidosis with biapical predominant pulmonary fibrosis. Very mild superimposed active inflammatory infiltrate. Morphologic changes compatible with pulmonary arterial hypertension. Aortic Atherosclerosis (ICD10-I70.0). Electronically Signed   By: Helyn Numbers MD   On: 07/10/2020 01:13   DG Chest Port 1  View  Result Date: 07/09/2020 CLINICAL DATA:  Shortness of breath EXAM: PORTABLE CHEST 1 VIEW COMPARISON:  10/26/2017 FINDINGS: Cardiac shadow is stable. Chronic scarring is noted in the apices bilaterally with hilar retraction consistent with the given clinical history of prior sarcoidosis. Patchy increased airspace opacities are noted in the lower lungs which may represent some acute on chronic infiltrate. Changes of prior vertebral augmentation are seen. No acute bony abnormality is noted. IMPRESSION: Changes of scarring consistent with prior sarcoidosis. Mild patchy airspace opacities are noted in the bases increased from the prior exam. This may represent further fibrotic change although the possibility of acute on chronic infiltrate deserves consideration. Electronically Signed   By: Alcide Clever M.D.   On: 07/09/2020 23:34      Assessment & Plan: Ms. JAELEY HEMSWORTH is a 64 y.o. white female with sarcoidosis, interstitial lung disease, obstructive sleep apnea, depression, GERD, hyperlipidemia, hypothyroidism, who was admitted to [facility] on 07/09/2020 for Sepsis (HCC) [A41.9]  1. Acute renal failure on chronic kidney disease stage IIIA: baseline creatinine 1.04, GFR of 57.  No acute indication for dialysis.  - monitor urine output, volume status, serum electrolytes and renal function.  - Continue IV fluids.  - Renally dose medications  2. Hypocalcemia: with history of hypercalcemia: correct calcium of 8.4. Will continue to monitor.   3. Anemia with renal failure: hemoglobin 11.2.   4. Sepsis: requiring vasopressors: phenylephrine ordered.  - empiric vancomycin and cefepime  5. Acute respiratory failure: requiring noninvasive ventilation - stress dose steroids     LOS: 0 Noris Kulinski 9/24/20218:23 AM

## 2020-07-10 NOTE — Progress Notes (Signed)
PHARMACY - PHYSICIAN COMMUNICATION CRITICAL VALUE ALERT - BLOOD CULTURE IDENTIFICATION (BCID)  Stacie [name] is an 64 y.o. female who presented to [facility] on 07/09/2020 with a chief complaint of altered mental status and shortness of breath.    Assessment:  Patient with PMH pulmonary sarcoidosis.  Blood culture with GNR in all 4 bottles, BCID = K. Pneumoniae withOUT resistance detected.  Possible source, CAP and/or UTI  Name of physician (or Provider) Contacted: Dr Patsey Berthold  Current antibiotics: vancomycin and cefepime   Changes to prescribed antibiotics recommended:  Recommendations accepted by provider - change to ceftriaxone  Results for orders placed or performed during the hospital encounter of 07/09/20  Blood Culture ID Panel (Reflexed) (Collected: 07/09/2020 11:36 PM)  Result Value Ref Range   Enterococcus faecalis NOT DETECTED NOT DETECTED   Enterococcus Faecium NOT DETECTED NOT DETECTED   Listeria monocytogenes NOT DETECTED NOT DETECTED   Staphylococcus species NOT DETECTED NOT DETECTED   Staphylococcus aureus (BCID) NOT DETECTED NOT DETECTED   Staphylococcus epidermidis NOT DETECTED NOT DETECTED   Staphylococcus lugdunensis NOT DETECTED NOT DETECTED   Streptococcus species NOT DETECTED NOT DETECTED   Streptococcus agalactiae NOT DETECTED NOT DETECTED   Streptococcus pneumoniae NOT DETECTED NOT DETECTED   Streptococcus pyogenes NOT DETECTED NOT DETECTED   A.calcoaceticus-baumannii NOT DETECTED NOT DETECTED   Bacteroides fragilis NOT DETECTED NOT DETECTED   Enterobacterales DETECTED (A) NOT DETECTED   Enterobacter cloacae complex NOT DETECTED NOT DETECTED   Escherichia coli NOT DETECTED NOT DETECTED   Klebsiella aerogenes NOT DETECTED NOT DETECTED   Klebsiella oxytoca NOT DETECTED NOT DETECTED   Klebsiella pneumoniae DETECTED (A) NOT DETECTED   Proteus species NOT DETECTED NOT DETECTED   Salmonella species NOT DETECTED NOT DETECTED   Serratia marcescens NOT  DETECTED NOT DETECTED   Haemophilus influenzae NOT DETECTED NOT DETECTED   Neisseria meningitidis NOT DETECTED NOT DETECTED   Pseudomonas aeruginosa NOT DETECTED NOT DETECTED   Stenotrophomonas maltophilia NOT DETECTED NOT DETECTED   Candida albicans NOT DETECTED NOT DETECTED   Candida auris NOT DETECTED NOT DETECTED   Candida glabrata NOT DETECTED NOT DETECTED   Candida krusei NOT DETECTED NOT DETECTED   Candida parapsilosis NOT DETECTED NOT DETECTED   Candida tropicalis NOT DETECTED NOT DETECTED   Cryptococcus neoformans/gattii NOT DETECTED NOT DETECTED   CTX-M ESBL NOT DETECTED NOT DETECTED   Carbapenem resistance IMP NOT DETECTED NOT DETECTED   Carbapenem resistance KPC NOT DETECTED NOT DETECTED   Carbapenem resistance NDM NOT DETECTED NOT DETECTED   Carbapenem resist OXA 48 LIKE NOT DETECTED NOT DETECTED   Carbapenem resistance VIM NOT DETECTED NOT DETECTED    Doreene Eland, PharmD, BCPS.   Work Cell: [phone] 07/10/2020 2:48 PM

## 2020-07-10 NOTE — Plan of Care (Signed)
Pt admitted to ICU 11, husband at bedside.  O2 sats 100% on Bipap at 35%, foley inserted per dr Patsey Berthold.  Pt extremely restless in bed and difficult to redirect, it is very difficult to keep IVs and monitoring lines in place as she repositions constantly in bed, also difficult to obtain accurate BPs as she lies on her arm often.  Pt redirected but is not retaining instruction at this time.  Husband updated on POC

## 2020-07-10 NOTE — Progress Notes (Signed)
CODE SEPSIS - PHARMACY COMMUNICATION  **Broad Spectrum Antibiotics should be administered within 1 hour of Sepsis diagnosis**  Time Code Sepsis Called/Page Received: 9/23 @ 2319  Antibiotics Ordered: Azithromycin   Time of 1st antibiotic administration: Azithromycin 500 mg IV X 1 on 9/24 @ 0005  Additional action taken by pharmacy:   If necessary, Name of Provider/Nurse Contacted:     Krystle Oberman D ,PharmD Clinical Pharmacist  07/10/2020  12:25 AM

## 2020-07-10 NOTE — ED Notes (Signed)
Pt ripped off bipap, rolling around in bed attempting to get up, pulling at equipment

## 2020-07-10 NOTE — Progress Notes (Signed)
Pharmacy Antibiotic Note  Stacie [name] is a 64 y.o. female admitted on 07/09/2020 with sepsis.  Pharmacy has been consulted for Cefepime/Vancomycin dosing.  Pt has stage 3 CKD.   CrCl = 15 ml/min   Plan: Ceftazidime 1 gm IV X 1 given in ED on 09/24 @ 0100. Cefepime 2 gm IV Q24H ordered to start on 9/25 @ 0100.  Vancomycin 2 gm IV X 1 ordered for 9/24 @ ~ 0200. Vancomycin 1 gm IV Q48H ordered to start on 9/26 @ 0200.   Height: _0  (170.2 cm) Weight: 83.9 kg (185 lb) IBW/kg (Calculated) : 61.6  Temp (24hrs), Avg:100.6 F (38.1 C), Min:100.6 F (38.1 C), Max:100.6 F (38.1 C)  Recent Labs  Lab 07/09/20 2327  WBC 32.9*  CREATININE 4.16*  LATICACIDVEN 5.8*    Estimated Creatinine Clearance: 15 mL/min (A) (by C-G formula based on SCr of 4.16 mg/dL (H)).    Allergies  Allergen Reactions  . Corn-Containing Products Diarrhea    headaches  . Nitrofurantoin Nausea Only  . Tramadol     Nausea and vomiting  . Sulfa Antibiotics Rash    As an infant    Antimicrobials this admission:   >>    >>   Dose adjustments this admission:   Microbiology results:  BCx:   UCx:    Sputum:    MRSA PCR:   Thank you for allowing pharmacy to be a part of this patient's care.  Avanti Jetter D 07/10/2020 1:35 AM

## 2020-07-10 NOTE — Progress Notes (Signed)
PHARMACY CONSULT NOTE - FOLLOW UP  Pharmacy Consult for Electrolyte Monitoring and Replacement   Recent Labs: Potassium (mmol/L)  Date Value  07/10/2020 4.6  12/28/2012 3.4 (L)   Calcium (mg/dL)  Date Value  07/10/2020 7.2 (L)   Calcium, Total (mg/dL)  Date Value  12/28/2012 8.0 (L)   Albumin (g/dL)  Date Value  07/10/2020 2.5 (L)  05/27/2019 4.1  12/26/2012 3.0 (L)   Sodium (mmol/L)  Date Value  07/10/2020 141  05/27/2019 144  12/28/2012 142     Assessment: Pharmacy consulted to replace electrolytes in this 64 year old female admitted with sepsis ,  Stage 3 CKD  9/24 @ 0504:    Ca = 7.2,  Alb = 2.5,  Corrected Ca = 8.48  Goal of Therapy:  Electrolytes WNL   Plan:  No additional electrolyte supplementation needed at this time.     Will add-on Mg and correct if warranted.  Will recheck electrolytes on 9/25 with AM labs.   Lu Duffel ,PharmD Clinical Pharmacist 07/10/2020 7:35 AM

## 2020-07-10 NOTE — Progress Notes (Signed)
PHARMACY CONSULT NOTE - FOLLOW UP  Pharmacy Consult for Electrolyte Monitoring and Replacement   Recent Labs: Potassium (mmol/L)  Date Value  07/09/2020 4.9  12/28/2012 3.4 (L)   Calcium (mg/dL)  Date Value  07/09/2020 8.1 (L)   Calcium, Total (mg/dL)  Date Value  12/28/2012 8.0 (L)   Albumin (g/dL)  Date Value  07/09/2020 2.9 (L)  05/27/2019 4.1  12/26/2012 3.0 (L)   Sodium (mmol/L)  Date Value  07/09/2020 139  05/27/2019 144  12/28/2012 142     Assessment: Pharmacy consulted to replace electrolytes in this 64 year old female admitted with sepsis ,  Stage 3 CKD  9/23 @ 2327:  Ca = 8.1,  Alb = 2.9,  Corrected Ca = 8.98  Goal of Therapy:  Electrolytes WNL   Plan:  No additional electrolyte supplementation needed at this time.   Will recheck electrolytes on 9/24 with AM labs.   Orene Desanctis ,PharmD Clinical Pharmacist 07/10/2020 1:55 AM

## 2020-07-10 NOTE — Progress Notes (Addendum)
PHARMACY CONSULT NOTE - FOLLOW UP  Pharmacy Consult for Electrolyte Monitoring and Replacement   Recent Labs: Potassium (mmol/L)  Date Value  07/10/2020 4.6  12/28/2012 3.4 (L)   Magnesium (mg/dL)  Date Value  07/10/2020 1.3 (L)   Calcium (mg/dL)  Date Value  07/10/2020 7.2 (L)   Calcium, Total (mg/dL)  Date Value  12/28/2012 8.0 (L)   Albumin (g/dL)  Date Value  07/10/2020 2.5 (L)  05/27/2019 4.1  12/26/2012 3.0 (L)   Sodium (mmol/L)  Date Value  07/10/2020 141  05/27/2019 144  12/28/2012 142     Assessment: Pharmacy consulted to replace electrolytes in this 64 year old female admitted with sepsis ,  Stage 3 CKD  9/24 @ 0504:    Ca = 7.2,  Alb = 2.5,  Corrected Ca = 8.48  Goal of Therapy:  Electrolytes WNL   Plan:  Update @ 1124: Mg 1.3. Magnesium has not been replaced. Will order Magnesium 4g x1 dose -discussed dose with Dr. Juleen China. Will recheck with AM labs.   Update @ 1256: Phos 6.8. Will notify MD.   Will check phosphorus level today, as an add-on lab draw.   Will recheck electrolytes on 9/25 with AM labs.   Rowland Lathe ,PharmD Clinical Pharmacist 07/10/2020 11:24 AM

## 2020-07-10 NOTE — ED Notes (Signed)
Pt sheets, chux, and brief changed at this time by this RN. Pt fidgeting with sheets and blankets at this time, attempting to take of bipap, follows commands but only for short periods of times. Pt placed on precedex to calm pand to keep bipap on.

## 2020-07-10 NOTE — H&P (Addendum)
Name: Stacie [name] MRN: [id number] DOB: Jan 19, 1956    ADMISSION DATE:  07/09/2020 CONSULTATION DATE:  07/10/2020  REFERRING MD :  Dr. Owens Shark  CHIEF COMPLAINT:  Altered Mental Status & Shortness of Breath  BRIEF PATIENT DESCRIPTION:  64 y.o. Female admitted with Sepsis and Acute on Chronic Hypoxic Respiratory Failure in the setting of Community Acquired Pneumonia and Acute Exacerbation of Pulmonary Sarcoidosis requiring BiPAP. Pt also with UTI and AKI, Nephrology has been consulted.  SIGNIFICANT EVENTS  9/23: Presented to ED, placed on BiPAP 9/24: PCCM asked to admit  STUDIES:  9/23: CXR>>Cardiac shadow is stable. Chronic scarring is noted in the apices bilaterally with hilar retraction consistent with the given clinical history of prior sarcoidosis. Patchy increased airspace opacities are noted in the lower lungs which may represent some acute on chronic infiltrate. Changes of prior vertebral augmentation are seen. No acute bony abnormality is noted. 9/24: CT Head>>Chronic white matter ischemic changes without acute abnormality 9/24: CT Chest w/o Contrast>>Chronic changes of pulmonary sarcoidosis with biapical predominant pulmonary fibrosis. Very mild superimposed active inflammatory infiltrate. Morphologic changes compatible with pulmonary arterial hypertension.  CULTURES: SARS-CoV-2 PCR 9/23>> negative Influenza A&B 9/23>>negative Respiratory Viral Panel 9/24>> Blood culture 9/23>> Sputum 9/24>> Urine 9/23>> Strep pneumo urinary antigen 9/24>> Legionella urinary antigen 9/24>> Aspergillus 9/24>> Fungitel 9/24>> C. Diff 9/24>>  ANTIBIOTICS: Azithromycin x1 dose 9/23 Ceftazidime x1 dose 9/23 Cefepime 9/24>> Vancomycin 9/24>>  HISTORY OF PRESENT ILLNESS:   Stacie [name] is a 64 y.o. Female with a past medical history significant for pulmonary sarcoidosis (on 2L [place] at baseline), pulmonary hypertension, OSA, asthma, hypertension, hypothyroidism, IBS, and chronic  kidney disease who presents to [facility] ED on 07/09/20 due to altered mental status and shortness of breath.  Pt is currently confused and unable to contribute to history, and no family present, therefore history is obtained from ED and nursing notes.  Per notes, pt exhibited altered mental status for approximately 1 day, and also had associated shortness of breath, generalized weakness, and diarrhea.  She was noted to have received her influenza vaccine 24 hours ago.  She has also received 2 doses of the COVID-19 vaccine and a booster dose.  Upon presentation to the ED she is oriented only to self, and noted to have dyspnea.  Vital signs included temperature 100.55F rectally, RR 28, HR 115, BP 138/96.  Given her work of breathing, she was placed on BiPAP.  Initial workup in the ED revealed WBC 32.9 with Neutrophilia and moderate left shift, lactic acid 5.8, bicarb 21, BUN 37, Creatinine 4.16, anion gap 19, AST 113, ALT 55, total bilirubin 1.5, high sensitivity troponin 88. EKG with sinus tachycardia and no ST changes.  CT head is negative for acute process.  CT Chest is concerning for chronic pulmonary sarcoidosis with superimposed pneumonia to the RUL and bilateral lower lobes.  She met sepsis criteria, therefore she was given IV fluid resuscitation, along with IV Ceftazidime and Azithromycin.  PCCM is asked to admit the pt to ICU for further workup and treatment of Sepsis and Acute on Chronic Hypoxic Respiratory Failure in the setting of Community Acquired Pneumonia and Acute Exacerbation of Pulmonary Sarcoidosis, along with AKI and UTI.  PAST MEDICAL HISTORY :   has a past medical history of Arrhythmia, Asthma, GERD (gastroesophageal reflux disease), Heart murmur, History of kidney stones, HOH (hard of hearing), Hypothyroidism, IBS (irritable bowel syndrome), Pulmonary hypertension ([place]), Sarcoid, Sarcoidosis, Seasonal allergies, Sleep apnea (CPAP with O2), and Wears hearing aid in both  ears.  has a past  surgical history that includes Partial hysterectomy (1990); Colon surgery; Tubal ligation; Pars plana vitrectomy (Right, 05/20/2015); Colonoscopy (09/18/2007); Colonoscopy (05/26/2010); Cardiac catheterization (10/18/2018); Cataract extraction w/PHACO (Left, 07/31/2019); and Cystoscopy/ureteroscopy/holmium laser/stent placement (Left, 02/20/2020). Prior to Admission medications   Medication Sig Start Date End Date Taking? Authorizing Provider  albuterol (VENTOLIN HFA) 108 (90 Base) MCG/ACT inhaler Inhale 2 puffs into the lungs every 6 (six) hours as needed for wheezing. 11/19/15   Laverle Hobby, MD  brimonidine (ALPHAGAN) 0.2 % ophthalmic solution Place 1 drop into both eyes 3 (three) times daily.  Patient not taking: Reported on 05/12/2020    [provider]  budesonide-formoterol (SYMBICORT) 80-4.5 MCG/ACT inhaler Take 2 puffs first thing in am and then another 2 puffs about 12 hours later. Patient taking differently: Inhale 2 puffs into the lungs in the morning and at bedtime. Take 2 puffs first thing in am and then another 2 puffs about 12 hours later. 05/08/17   Tanda Rockers, MD  calcium carbonate (TUMS - DOSED IN MG ELEMENTAL CALCIUM) 500 MG chewable tablet Chew 1 tablet by mouth daily as needed for indigestion or heartburn.    [provider]  cephALEXin (KEFLEX) 500 MG capsule Take 1 capsule (500 mg total) by mouth 3 (three) times daily. Patient not taking: Reported on 06/12/2020 04/07/20   Hollice Espy, MD  conjugated estrogens (PREMARIN) vaginal cream Apply one pea-sized amount around the opening of the urethra three times weekly. 05/12/20   Vaillancourt, Aldona Bar, PA-C  denosumab (PROLIA) 60 MG/ML SOSY injection Inject 60 mg into the skin every 6 (six) months.    [provider]  dorzolamide (TRUSOPT) 2 % ophthalmic solution Place 1 drop into both eyes 2 (two) times daily.    [provider]  escitalopram (LEXAPRO) 20 MG tablet  03/05/20   [provider]  folic acid (FOLVITE) 1 MG tablet Take 1 mg by mouth daily. 07/30/18   [provider]  latanoprost (XALATAN) 0.005 % ophthalmic solution Place 1 drop into the right eye at bedtime.    [provider]  methotrexate (RHEUMATREX) 2.5 MG tablet Take by mouth. 03/06/20   [provider]  methotrexate (TREXALL) 5 MG tablet TAKE 5 TABLETS (25 MG TOTAL) BY MOUTH EVERY 7 (SEVEN) DAYS 03/04/20   [provider]  mirabegron ER (MYRBETRIQ) 25 MG TB24 tablet Take 1 tablet (25 mg total) by mouth daily. 06/12/20   Debroah Loop, PA-C  Multiple Vitamins-Minerals (MULTIVITAMIN WITH MINERALS) tablet Take 1 tablet by mouth daily.    [provider]  NON FORMULARY CPAP nightly    [provider]  oxybutynin (DITROPAN) 5 MG tablet  02/20/20   [provider]  oxyCODONE-acetaminophen (PERCOCET/ROXICET) 5-325 MG tablet  02/20/20   [provider]  OXYGEN Inhale 2 L into the lungs as needed.    [provider]  predniSONE (DELTASONE) 2.5 MG tablet Take 12.5 mg by mouth daily with breakfast. 12.5 mg 03/10/19   [provider]  Respiratory Therapy Supplies (FLUTTER) DEVI Use as directed 10/26/17   Tanda Rockers, MD  SYNTHROID 137 MCG tablet TAKE 1 TABLET (137 MCG TOTAL) BY MOUTH DAILY BEFORE BREAKFAST. 04/04/20   Glean Hess, MD  Treprostinil (TYVASO) 0.6 MG/ML SOLN Inhale 18 mcg into the lungs 4 (four) times daily. 12 breaths 4 times a day    [provider]  VITAMIN D PO Take 1 tablet by mouth once a week.  [provider]  Vitamin D, Ergocalciferol, (DRISDOL) 1.25 MG (50000 UNIT) CAPS capsule Take 50,000 Units by mouth once a week. Patient not taking: Reported on 06/12/2020 03/17/20   [provider]   Allergies  Allergen Reactions  . Corn-Containing Products Diarrhea    headaches  . Nitrofurantoin Nausea Only  . Tramadol     Nausea and vomiting  . Sulfa Antibiotics Rash     As an infant    FAMILY HISTORY:  family history includes Allergies in her brother and father; Asthma in her brother and father; Breast cancer in her maternal grandmother; Colon cancer in her father. SOCIAL HISTORY:  reports that she has never smoked. She has never used smokeless tobacco. She reports that she does not drink alcohol and does not use drugs.   COVID-19 DISASTER DECLARATION:  FULL CONTACT PHYSICAL EXAMINATION WAS NOT POSSIBLE DUE TO TREATMENT OF COVID-19 AND  CONSERVATION OF PERSONAL PROTECTIVE EQUIPMENT, LIMITED EXAM FINDINGS INCLUDE-  Patient assessed or the symptoms described in the history of present illness.  In the context of the Global COVID-19 pandemic, which necessitated consideration that the patient might be at risk for infection with the SARS-CoV-2 virus that causes COVID-19, Institutional protocols and algorithms that pertain to the evaluation of patients at risk for COVID-19 are in a state of rapid change based on information released by regulatory bodies including the CDC and federal and state organizations. These policies and algorithms were followed during the patient's care while in hospital.  REVIEW OF SYSTEMS:   Unable to assess due to AMS  SUBJECTIVE:  Unable to assess due to AMS  VITAL SIGNS: Temp:  [100.6 F (38.1 C)] 100.6 F (38.1 C) (09/23 2345) Pulse Rate:  [32-115] 32 (09/24 0115) Resp:  [28-29] 29 (09/24 0115) BP: (115)/(61) 115/61 (09/24 0115) SpO2:  [95 %-98 %] 95 % (09/24 0115) Weight:  [83.9 kg] 83.9 kg (09/24 0004)  PHYSICAL EXAMINATION: General:  Acute on chronically ill appearing female, laying in bed, on BiPAP, with mild respiratory distress Neuro:  Awake, oriented to self and place, follows commands, no focal deficits, pupils PERRL HEENT:  Atraumatic, normocephalic, neck supple, no JVD Cardiovascular:  Tachycardia, regular rhythm, s1s2, no M/R/G Lungs:  Diffuse rhonchi with expiratory wheezing to the Right, tachypnea, mild  assessory muscle use, BiPAP assisted Abdomen:  Obese, soft, nontender, nondistended, no guarding or rebound tenderness, BS+ x4 Musculoskeletal: No deformities, no edema Skin:  Warm and dry.  No obvious rashes, lesions, or ulcerations  Recent Labs  Lab 07/09/20 2327  NA 139  K 4.9  CL 99  CO2 21*  BUN 37*  CREATININE 4.16*  GLUCOSE 92   Recent Labs  Lab 07/09/20 2327  HGB 12.2  HCT 38.2  WBC 32.9*  PLT 159   CT Head Wo Contrast  Result Date: 07/10/2020 CLINICAL DATA:  Increasing confusion and weakness EXAM: CT HEAD WITHOUT CONTRAST TECHNIQUE: Contiguous axial images were obtained from the base of the skull through the vertex without intravenous contrast. COMPARISON:  None. FINDINGS: Brain: Mild chronic white matter ischemic changes are noted. No findings to suggest acute hemorrhage, acute infarction or space-occupying mass lesion is seen. Vascular: No hyperdense vessel or unexpected calcification. Skull: Normal. Negative for fracture or focal lesion. Sinuses/Orbits: No acute finding. Other: None. IMPRESSION: Chronic white matter ischemic changes without acute abnormality Electronically Signed   By: Inez Catalina M.D.   On: 07/10/2020 01:04   CT Chest Wo Contrast  Result Date: 07/10/2020 CLINICAL DATA:  Pneumonia, dyspnea,  altered mental status. Pulmonary sarcoidosis. EXAM: CT CHEST WITHOUT CONTRAST TECHNIQUE: Multidetector CT imaging of the chest was performed following the standard protocol without IV contrast. COMPARISON:  02/20/2015 FINDINGS: Cardiovascular: Cardiac size within normal limits. Mild calcification of the mitral valve annulus. No significant coronary artery calcification. No pericardial effusion. The central pulmonary arteries are enlarged in keeping with changes of pulmonary arterial hypertension. Atherosclerotic calcification is seen within the aortic arch and proximal arch vasculature as well as the descending thoracic aorta. The aorta is of normal caliber.  Mediastinum/Nodes: No pathologic thoracic adenopathy. Small hiatal hernia. Lungs/Pleura: There are again identified biapical predominant peribronchovascular interstitial thickening, bronchiectasis, and fibrosis with superior retraction of the hila in keeping with chronic changes of pulmonary sarcoidosis. Scattered ground-glass pulmonary infiltrate is best visualized within the anterior segment of the right upper lobe, but also, subtly, within the lower lobes bilaterally in keeping with active inflammatory change. No focal pulmonary nodules. No pneumothorax or pleural effusion. Upper Abdomen: Unremarkable Musculoskeletal: no acute bone abnormality. T7, T8, and T9 vertebroplasty has been performed. Chronic appearing superior endplate fracture of V01 is noted, though is new from prior examination. IMPRESSION: Chronic changes of pulmonary sarcoidosis with biapical predominant pulmonary fibrosis. Very mild superimposed active inflammatory infiltrate. Morphologic changes compatible with pulmonary arterial hypertension. Aortic Atherosclerosis (ICD10-I70.0). Electronically Signed   By: Fidela Salisbury MD   On: 07/10/2020 01:13   DG Chest Port 1 View  Result Date: 07/09/2020 CLINICAL DATA:  Shortness of breath EXAM: PORTABLE CHEST 1 VIEW COMPARISON:  10/26/2017 FINDINGS: Cardiac shadow is stable. Chronic scarring is noted in the apices bilaterally with hilar retraction consistent with the given clinical history of prior sarcoidosis. Patchy increased airspace opacities are noted in the lower lungs which may represent some acute on chronic infiltrate. Changes of prior vertebral augmentation are seen. No acute bony abnormality is noted. IMPRESSION: Changes of scarring consistent with prior sarcoidosis. Mild patchy airspace opacities are noted in the bases increased from the prior exam. This may represent further fibrotic change although the possibility of acute on chronic infiltrate deserves consideration. Electronically  Signed   By: Inez Catalina M.D.   On: 07/09/2020 23:34    ASSESSMENT / PLAN:  Acute on Chronic Hypoxic Respiratory Failure secondary to CAP & Acute Exacerbation of Pulmonary Sarcoidosis -Supplemental O2 as needed to maintain O2 sats >92% -BiPAP, wean as tolerated -High risk for intubation -Follow intermittent CXR & ABG as needed -ABX as above -IV Steroids -Bronchodilators   Sepsis secondary to Community Acquired Pneumonia and UTI -Monitor fever curve -Trend WBC's & Procalcitonin -Follow cultures as above -Received Azithromycin & Ceftazidime in ED -Will place on Vancomycin & Cefepime for now   AKI -Anion Gap Metabolic Acidosis -Monitor I&O's / urinary output -Follow BMP -Ensure adequate renal perfusion -Avoid nephrotoxic agents as able -Replace electrolytes as indicated -Nephrology consulted, appreciate input   Mildly elevated troponin, suspected demand ischemia -Continuous cardiac monitoring -Maintain MAP >65 -Pt without chest pain, EKG without ST changes -Trend troponin until downtrending   Elevated LFT's -NPO -Trend LFT's -Obtain Abdominal US          [facility] DISPOSITION: ICU GOALS OF CARE: Full Code VTE Prophylaxis: Heparin SQ Consults: Nephrology Updates: No family at bedside for update 07/10/20 during NP rounds   Darel Hong, [facility] Pager: [phone]  07/10/2020, 1:45 AM

## 2020-07-11 DIAGNOSIS — R6520 Severe sepsis without septic shock: Secondary | ICD-10-CM | POA: Diagnosis not present

## 2020-07-11 DIAGNOSIS — A414 Sepsis due to anaerobes: Secondary | ICD-10-CM | POA: Diagnosis not present

## 2020-07-11 DIAGNOSIS — A419 Sepsis, unspecified organism: Secondary | ICD-10-CM | POA: Diagnosis not present

## 2020-07-11 LAB — COMPREHENSIVE METABOLIC PANEL
ALT: 97 U/L — ABNORMAL HIGH (ref 0–44)
AST: 137 U/L — ABNORMAL HIGH (ref 15–41)
Albumin: 2.5 g/dL — ABNORMAL LOW (ref 3.5–5.0)
Alkaline Phosphatase: 89 U/L (ref 38–126)
Anion gap: 18 — ABNORMAL HIGH (ref 5–15)
BUN: 66 mg/dL — ABNORMAL HIGH (ref 8–23)
CO2: 21 mmol/L — ABNORMAL LOW (ref 22–32)
Calcium: 7.2 mg/dL — ABNORMAL LOW (ref 8.9–10.3)
Chloride: 103 mmol/L (ref 98–111)
Creatinine, Ser: 4.49 mg/dL — ABNORMAL HIGH (ref 0.44–1.00)
GFR calc Af Amer: 11 mL/min — ABNORMAL LOW (ref >60)
GFR calc non Af Amer: 10 mL/min — ABNORMAL LOW (ref >60)
Glucose, Bld: 128 mg/dL — ABNORMAL HIGH (ref 70–99)
Potassium: 4.7 mmol/L (ref 3.5–5.1)
Sodium: 142 mmol/L (ref 135–145)
Total Bilirubin: 0.8 mg/dL (ref 0.3–1.2)
Total Protein: 5.9 g/dL — ABNORMAL LOW (ref 6.5–8.1)

## 2020-07-11 LAB — CBC WITH DIFFERENTIAL/PLATELET
Abs Immature Granulocytes: 3.67 10*3/uL — ABNORMAL HIGH (ref 0.00–0.07)
Basophils Absolute: 0 10*3/uL (ref 0.0–0.1)
Basophils Relative: 0 %
Eosinophils Absolute: 0 10*3/uL (ref 0.0–0.5)
Eosinophils Relative: 0 %
HCT: 35.8 % — ABNORMAL LOW (ref 36.0–46.0)
Hemoglobin: 11.9 g/dL — ABNORMAL LOW (ref 12.0–15.0)
Immature Granulocytes: 14 %
Lymphocytes Relative: 1 %
Lymphs Abs: 0.4 10*3/uL — ABNORMAL LOW (ref 0.7–4.0)
MCH: 31.2 pg (ref 26.0–34.0)
MCHC: 33.2 g/dL (ref 30.0–36.0)
MCV: 94 fL (ref 80.0–100.0)
Monocytes Absolute: 0.9 10*3/uL (ref 0.1–1.0)
Monocytes Relative: 3 %
Neutro Abs: 21.9 10*3/uL — ABNORMAL HIGH (ref 1.7–7.7)
Neutrophils Relative %: 82 %
Platelets: 67 10*3/uL — ABNORMAL LOW (ref 150–400)
RBC: 3.81 MIL/uL — ABNORMAL LOW (ref 3.87–5.11)
RDW: 13.7 % (ref 11.5–15.5)
WBC: 26.9 10*3/uL — ABNORMAL HIGH (ref 4.0–10.5)
nRBC: 0 % (ref 0.0–0.2)

## 2020-07-11 LAB — BLOOD GAS, ARTERIAL
Acid-base deficit: 5.6 mmol/L — ABNORMAL HIGH (ref 0.0–2.0)
Acid-base deficit: 4.9 mmol/L — ABNORMAL HIGH (ref 0.0–2.0)
Bicarbonate: 21.1 mmol/L (ref 20.0–28.0)
Bicarbonate: 21.1 mmol/L (ref 20.0–28.0)
Delivery systems: POSITIVE
FIO2: 0.3
O2 Saturation: 97.4 %
O2 Saturation: 98.1 %
Patient temperature: 37
Patient temperature: 37
pCO2 arterial: 45 mmHg (ref 32.0–48.0)
pCO2 arterial: 42 mmHg (ref 32.0–48.0)
pH, Arterial: 7.28 — ABNORMAL LOW (ref 7.350–7.450)
pH, Arterial: 7.31 — ABNORMAL LOW (ref 7.350–7.450)
pO2, Arterial: 107 mmHg (ref 83.0–108.0)
pO2, Arterial: 116 mmHg — ABNORMAL HIGH (ref 83.0–108.0)

## 2020-07-11 LAB — PROCALCITONIN: Procalcitonin: >150 ng/mL

## 2020-07-11 LAB — MAGNESIUM: Magnesium: 2.8 mg/dL — ABNORMAL HIGH (ref 1.7–2.4)

## 2020-07-11 MED ORDER — MORPHINE SULFATE (PF) 2 MG/ML IV SOLN
2.0000 mg | Freq: Once | INTRAVENOUS | Status: AC
  Administered 2020-07-11: 2 mg via INTRAVENOUS

## 2020-07-11 MED ORDER — INSULIN ASPART 100 UNIT/ML IV SOLN
10.0000 [IU] | Freq: Once | INTRAVENOUS | Status: AC
  Administered 2020-07-11: 10 [IU] via INTRAVENOUS
  Filled 2020-07-11: qty 0.1

## 2020-07-11 MED ORDER — MORPHINE SULFATE (PF) 2 MG/ML IV SOLN
INTRAVENOUS | Status: AC
  Filled 2020-07-11: qty 1

## 2020-07-11 MED ORDER — SODIUM CHLORIDE 0.9 % IV SOLN: INTRAVENOUS | Status: AC

## 2020-07-11 MED ORDER — DEXTROSE 50 % IV SOLN
1.0000 | Freq: Once | INTRAVENOUS | Status: AC
  Administered 2020-07-11: 50 mL via INTRAVENOUS
  Filled 2020-07-11: qty 50

## 2020-07-11 NOTE — Progress Notes (Signed)
Stacie [name]  MRN: [id number]  DOB/AGE: 1956/09/17 64 y.o.  Primary Care Physician:Berglund, Nyoka Cowden, MD  Admit date: 07/09/2020  Chief Complaint:  Chief Complaint  Patient presents with  . Shortness of Breath  . Altered Mental Status    S-Pt presented on  07/09/2020 with  Chief Complaint  Patient presents with  . Shortness of Breath  . Altered Mental Status  .    Pt is lethargic   Medications . chlorhexidine  15 mL Mouth Rinse BID  . Chlorhexidine Gluconate Cloth  6 each Topical Q0600  . mouth rinse  15 mL Mouth Rinse q12n4p  . methylPREDNISolone (SOLU-MEDROL) injection  40 mg Intravenous Q12H         ROS: Unable to get any data   Physical Exam: Vital signs in last 24 hours: Temp:  [97.5 F (36.4 C)-98.6 F (37 C)] 97.6 F (36.4 C) (09/25 1200) Pulse Rate:  [72-120] 78 (09/25 1400) Resp:  [21-39] 21 (09/25 1400) BP: (68-145)/(50-102) 145/86 (09/25 1400) SpO2:  [88 %-100 %] 98 % (09/25 1400) FiO2 (%):  [30 %] 30 % (09/25 1200) Weight change: -0.015 kg    Intake/Output from previous day: 09/24 0701 - 09/25 0700 In: 4088.5 [I.V.:3328.4; IV Piggyback:760.1] Out: 260 [Urine:260] Total I/O In: 996.9 [I.V.:746.9; IV Piggyback:250] Out: 205 [Urine:205]   Physical Exam: General- pt is awake,alert, oriented to time place and person HEENT- Bipap in situ  Resp- moderate REsp distress,  Rhonchi present CVS- S1S2 regular in rate and rhythm GIT- BS+, soft, NT, ND EXT- NO LE Edema, Cyanosis   Lab Results: CBC Recent Labs    07/10/20 0504 07/11/20 0445  WBC 34.4* 26.9*  HGB 11.2* 11.9*  HCT 34.6* 35.8*  PLT 133* 67*    BMET Recent Labs    07/10/20 2259 07/11/20 0445  NA 141 142  K 5.9* 4.7  CL 102 103  CO2 22 21*  GLUCOSE 122* 128*  BUN 59* 66*  CREATININE 4.57* 4.49*  CALCIUM 7.4* 7.2*    Creatinine trend 2021 4.1==>4.6==>4.5 2020  1.1 2019 1.1  2018 1.03 2017 1.0--1.23 2016 1.1--1.6 2015 1.1--1.3 2014 1.2--1.4    Anion  gap  142- 124 = 18  Albumin 2.5  Delta AG 18-8=10  Delta Bicarb  24-21=3     MICRO Recent Results (from the past 240 hour(s))  Urine culture     Status: Abnormal (Preliminary result)   Collection Time: 07/09/20 11:27 PM   Specimen: In/Out Cath Urine  Result Value Ref Range Status   Specimen Description   Final    IN/OUT CATH URINE Performed at [facility], [address] [place], [place] [address]    Special Requests   Final    NONE Performed at [facility], [address] [place], [place] [address]    Culture (A)  Final    >=100,000 COLONIES/mL KLEBSIELLA PNEUMONIAE SUSCEPTIBILITIES TO FOLLOW Performed at [facility], [address] [place], [place] [address]    Report Status PENDING  Incomplete  Respiratory Panel by RT PCR (Flu A&B, Covid) - Nasopharyngeal Swab     Status: None   Collection Time: 07/09/20 11:28 PM   Specimen: Nasopharyngeal Swab  Result Value Ref Range Status   SARS Coronavirus 2 by RT PCR NEGATIVE NEGATIVE Final    Comment: (NOTE) SARS-CoV-2 target nucleic acids are NOT DETECTED.  The SARS-CoV-2 RNA is generally detectable in upper respiratoy specimens during the acute phase of infection. The lowest concentration of SARS-CoV-2  viral copies this assay can detect is 131 copies/mL. A negative result does not preclude SARS-Cov-2 infection and should not be used as the sole basis for treatment or other patient management decisions. A negative result may occur with  improper specimen collection/handling, submission of specimen other than nasopharyngeal swab, presence of viral mutation(s) within the areas targeted by this assay, and inadequate number of viral copies (<131 copies/mL). A negative result must be combined with clinical observations, patient history, and epidemiological information. The expected result is Negative.  Fact Sheet for Patients:  https://www.moore.com/  Fact Sheet for  Healthcare Providers:  https://www.young.biz/  This test is no t yet approved or cleared by the [place] FDA and  has been authorized for detection and/or diagnosis of SARS-CoV-2 by FDA under an Emergency Use Authorization (EUA). This EUA will remain  in effect (meaning this test can be used) for the duration of the COVID-19 declaration under Section 564(b)(1) of the Act, 21 U.S.C. section 360bbb-3(b)(1), unless the authorization is terminated or revoked sooner.     Influenza A by PCR NEGATIVE NEGATIVE Final   Influenza B by PCR NEGATIVE NEGATIVE Final    Comment: (NOTE) The Xpert Xpress SARS-CoV-2/FLU/RSV assay is intended as an aid in  the diagnosis of influenza from Nasopharyngeal swab specimens and  should not be used as a sole basis for treatment. Nasal washings and  aspirates are unacceptable for Xpert Xpress SARS-CoV-2/FLU/RSV  testing.  Fact Sheet for Patients: https://www.moore.com/  Fact Sheet for Healthcare Providers: https://www.young.biz/  This test is not yet approved or cleared by the [place] FDA and  has been authorized for detection and/or diagnosis of SARS-CoV-2 by  FDA under an Emergency Use Authorization (EUA). This EUA will remain  in effect (meaning this test can be used) for the duration of the  Covid-19 declaration under Section 564(b)(1) of the Act, 21  U.S.C. section 360bbb-3(b)(1), unless the authorization is  terminated or revoked. Performed at [facility], [address] [place], [place] [address]   Blood Culture (routine x 2)     Status: Abnormal (Preliminary result)   Collection Time: 07/09/20 11:36 PM   Specimen: BLOOD  Result Value Ref Range Status   Specimen Description   Final    BLOOD RIGHT ANTECUBITAL Performed at [facility], [address], [place], [place] [address]    Special Requests   Final    BOTTLES DRAWN AEROBIC AND ANAEROBIC Blood  Culture adequate volume Performed at [facility], [address] [place], [place] [address]    Culture  Setup Time   Final    Organism ID to follow GRAM NEGATIVE RODS IN BOTH AEROBIC AND ANAEROBIC BOTTLES CRITICAL RESULT CALLED TO, READ BACK BY AND VERIFIED WITH: AMY THOMPSON AT 1220 07/10/20 SDR Performed at [facility], [address], [place], [place] [address]    Culture KLEBSIELLA PNEUMONIAE (A)  Final   Report Status PENDING  Incomplete  Blood Culture ID Panel (Reflexed)     Status: Abnormal   Collection Time: 07/09/20 11:36 PM  Result Value Ref Range Status   Enterococcus faecalis NOT DETECTED NOT DETECTED Final   Enterococcus Faecium NOT DETECTED NOT DETECTED Final   Listeria monocytogenes NOT DETECTED NOT DETECTED Final   Staphylococcus species NOT DETECTED NOT DETECTED Final   Staphylococcus aureus (BCID) NOT DETECTED NOT DETECTED Final   Staphylococcus epidermidis NOT DETECTED NOT DETECTED Final   Staphylococcus lugdunensis NOT DETECTED NOT DETECTED Final   Streptococcus species NOT DETECTED  NOT DETECTED Final   Streptococcus agalactiae NOT DETECTED NOT DETECTED Final   Streptococcus pneumoniae NOT DETECTED NOT DETECTED Final   Streptococcus pyogenes NOT DETECTED NOT DETECTED Final   A.calcoaceticus-baumannii NOT DETECTED NOT DETECTED Final   Bacteroides fragilis NOT DETECTED NOT DETECTED Final   Enterobacterales DETECTED (A) NOT DETECTED Final    Comment: Enterobacterales represent a large order of gram negative bacteria, not a single organism. CRITICAL RESULT CALLED TO, READ BACK BY AND VERIFIED WITH:  AMY THOMPSON AT 1220 04/09/20 SDR    Enterobacter cloacae complex NOT DETECTED NOT DETECTED Final   Escherichia coli NOT DETECTED NOT DETECTED Final   Klebsiella aerogenes NOT DETECTED NOT DETECTED Final   Klebsiella oxytoca NOT DETECTED NOT DETECTED Final   Klebsiella pneumoniae DETECTED (A) NOT DETECTED Final    Comment: CRITICAL RESULT CALLED  TO, READ BACK BY AND VERIFIED WITH:  AMY THOMPSON AT 1220 07/10/20 SDR    Proteus species NOT DETECTED NOT DETECTED Final   Salmonella species NOT DETECTED NOT DETECTED Final   Serratia marcescens NOT DETECTED NOT DETECTED Final   Haemophilus influenzae NOT DETECTED NOT DETECTED Final   Neisseria meningitidis NOT DETECTED NOT DETECTED Final   Pseudomonas aeruginosa NOT DETECTED NOT DETECTED Final   Stenotrophomonas maltophilia NOT DETECTED NOT DETECTED Final   Candida albicans NOT DETECTED NOT DETECTED Final   Candida auris NOT DETECTED NOT DETECTED Final   Candida glabrata NOT DETECTED NOT DETECTED Final   Candida krusei NOT DETECTED NOT DETECTED Final   Candida parapsilosis NOT DETECTED NOT DETECTED Final   Candida tropicalis NOT DETECTED NOT DETECTED Final   Cryptococcus neoformans/gattii NOT DETECTED NOT DETECTED Final   CTX-M ESBL NOT DETECTED NOT DETECTED Final   Carbapenem resistance IMP NOT DETECTED NOT DETECTED Final   Carbapenem resistance KPC NOT DETECTED NOT DETECTED Final   Carbapenem resistance NDM NOT DETECTED NOT DETECTED Final   Carbapenem resist OXA 48 LIKE NOT DETECTED NOT DETECTED Final   Carbapenem resistance VIM NOT DETECTED NOT DETECTED Final    Comment: Performed at [facility], [address] [place], [place] [address]  Blood Culture (routine x 2)     Status: Abnormal (Preliminary result)   Collection Time: 07/09/20 11:37 PM   Specimen: BLOOD  Result Value Ref Range Status   Specimen Description   Final    BLOOD BLOOD RIGHT FOREARM Performed at [facility], [address], [place], [place] [address]    Special Requests   Final    BOTTLES DRAWN AEROBIC AND ANAEROBIC Blood Culture adequate volume Performed at [facility], [address] [place], [place] [address]    Culture  Setup Time   Final    GRAM NEGATIVE RODS IN BOTH AEROBIC AND ANAEROBIC BOTTLES CRITICAL RESULT CALLED TO, READ BACK BY AND VERIFIED WITH: AMY  THOMPSON AT 1220 07/10/20 SDR Performed at [facility], [address], [place], [place] [address]    Culture KLEBSIELLA PNEUMONIAE (A)  Final   Report Status PENDING  Incomplete  MRSA PCR Screening     Status: None   Collection Time: 07/10/20 12:54 PM   Specimen: Nasal Mucosa; Nasopharyngeal  Result Value Ref Range Status   MRSA by PCR NEGATIVE NEGATIVE Final    Comment:        The GeneXpert MRSA Assay (FDA approved for NASAL specimens only), is one component of a comprehensive MRSA colonization surveillance program. It is not intended to diagnose MRSA infection nor to guide or monitor treatment  for MRSA infections. Performed at [facility], [address] [place], [place] [address]       Lab Results  Component Value Date   CALCIUM 7.2 (L) 07/11/2020   CAION 1.05 (L) 02/20/2020   PHOS 6.5 (H) 07/10/2020           Impression:   Stacie [name] is a 64 year old Caucasian female with sarcoidosis, interstitial lung disease, obstructive sleep apnea, depression, GERD, hyperlipidemia, hypothyroidism, who was admitted to [facility] on 07/09/2020 for Sepsis (HCC) [A41.9]   1)Renal  AKI secondary to ATN Patient has AKI secondary to hypotension-patient required vasopressors/sepsis Patient has AKI on CKD Patient has CKD stage IIIa Patient has CKD stage IIIa since 2016 Patient CKD could be secondary to her past medical history of sarcoid  Patient AKI is currently at plateau Non Oliguric   2)HTN Blood pressure stable   3)Anemia of chronic disease  HGb at goal (9--11)   4) secondary hyperparathyroidism -CKD Mineral-Bone Disorder   Secondary Hyperparathyroidism  present   Phosphorus is not at goal.   5) sepsis Patient is currently on broad-spectrum antibiotics   6) electrolytes   sodium Normonatremic   potassium Hyperkalemia Now better   7)Acid base Co2 is just at  Goal  Patient has high anion gap acidosis Patient has  acidosis secondary to lactic acidosis   8) acute respiratory failure Patient was intubated, now on BIpap     Plan:   Will start pt on NS alternating  with Bicarb NO acute indication for renal replacement therapy      Ailany Koren s Shabreka Coulon 07/11/2020, 5:35 PM

## 2020-07-11 NOTE — Progress Notes (Signed)
Shift summary:  - Patient is back on BiPAP this AM due to respiratory distress after not requiring BiPAP overnight.  - Delirium and agitation persist; still requiring infusion of Precedex.

## 2020-07-11 NOTE — Progress Notes (Signed)
PHARMACY CONSULT NOTE - FOLLOW UP  Pharmacy Consult for Electrolyte Monitoring and Replacement   Recent Labs: Potassium (mmol/L)  Date Value  07/11/2020 4.7  12/28/2012 3.4 (L)   Magnesium (mg/dL)  Date Value  07/11/2020 2.8 (H)   Calcium (mg/dL)  Date Value  07/11/2020 7.2 (L)   Calcium, Total (mg/dL)  Date Value  12/28/2012 8.0 (L)   Albumin (g/dL)  Date Value  07/11/2020 2.5 (L)  05/27/2019 4.1  12/26/2012 3.0 (L)   Phosphorus (mg/dL)  Date Value  07/10/2020 6.5 (H)   Sodium (mmol/L)  Date Value  07/11/2020 142  05/27/2019 144  12/28/2012 142     Assessment: Pharmacy consulted to replace electrolytes in this 64 year old female admitted with sepsis ,  Stage 3 CKD. Nephro following.    Goal of Therapy:  Electrolytes WNL   Plan:  No replacement needed at this time.    Will recheck electrolytes with AM labs.   Oswald Hillock ,PharmD, BCPS Clinical Pharmacist 07/11/2020 7:24 AM

## 2020-07-11 NOTE — Progress Notes (Deleted)
Fentanyl and versed given through out shift for bucking vent and biting tube.

## 2020-07-11 NOTE — Plan of Care (Signed)
  Problem: Health Behavior/Discharge Planning: Goal: Ability to manage health-related needs will improve Outcome: Not Progressing   Problem: Clinical Measurements: Goal: Ability to maintain clinical measurements within normal limits will improve Outcome: Not Progressing Goal: Will remain free from infection Outcome: Not Progressing Goal: Diagnostic test results will improve Outcome: Not Progressing Goal: Respiratory complications will improve Outcome: Not Progressing Goal: Cardiovascular complication will be avoided Outcome: Not Progressing   Problem: Activity: Goal: Risk for activity intolerance will decrease Outcome: Not Progressing   Problem: Nutrition: Goal: Adequate nutrition will be maintained Outcome: Not Progressing   Problem: Coping: Goal: Level of anxiety will decrease Outcome: Not Progressing   Problem: Elimination: Goal: Will not experience complications related to bowel motility Outcome: Not Progressing Goal: Will not experience complications related to urinary retention Outcome: Not Progressing   Problem: Pain Managment: Goal: General experience of comfort will improve Outcome: Not Progressing   Problem: Safety: Goal: Ability to remain free from injury will improve Outcome: Not Progressing   Problem: Skin Integrity: Goal: Risk for impaired skin integrity will decrease Outcome: Not Progressing

## 2020-07-11 NOTE — Progress Notes (Signed)
Name: Stacie [name] MRN: [id number] DOB: 1956/10/14    ADMISSION DATE:  07/09/2020 INITIAL CONSULTATION DATE:  07/10/2020  REFERRING MD :  Dr. Owens Shark  CHIEF COMPLAINT:  Altered Mental Status & Shortness of Breath  BRIEF PATIENT DESCRIPTION:  64 y.o. Female admitted with Sepsis and Acute on Chronic Hypoxic Respiratory Failure in the setting of Community Acquired Pneumonia and Acute Exacerbation of Pulmonary Sarcoidosis requiring BiPAP. Pt also with UTI and AKI, Nephrology has been consulted.  SIGNIFICANT EVENTS  9/23: Presented to ED, placed on BiPAP 9/24: PCCM asked to admit  STUDIES:  9/23: CXR>>Cardiac shadow is stable. Chronic scarring is noted in the apices bilaterally with hilar retraction consistent with the given clinical history of prior sarcoidosis. Patchy increased airspace opacities are noted in the lower lungs which may represent some acute on chronic infiltrate. Changes of prior vertebral augmentation are seen. No acute bony abnormality is noted. 9/24: CT Head>>Chronic white matter ischemic changes without acute abnormality 9/24: CT Chest w/o Contrast>>Chronic changes of pulmonary sarcoidosis with biapical predominant pulmonary fibrosis. Very mild superimposed active inflammatory infiltrate. Morphologic changes compatible with pulmonary arterial hypertension.  CULTURES: SARS-CoV-2 PCR 9/23>> negative Influenza A&B 9/23>>negative Respiratory Viral Panel 9/24>> negative Blood culture 9/23>> Klebsiella pneumoniae all 4 bottles Sputum 9/24>> unable to obtain, nonproductive Urine 9/23>> Klebsiella pneumonia Strep pneumo urinary antigen 9/24>> not obtained Legionella urinary antigen 9/24>> not obtained Aspergillus 9/24>> pending Fungitel 9/24>> pending C. Diff 9/24>> no sample  ANTIBIOTICS: Azithromycin x1 dose 9/23 Ceftazidime x1 dose 9/23 Cefepime 9/24>> 9/24 Vancomycin 9/24>> 9/24 Ceftriaxone 9/24  PATIENT PROFILE:   Stacie [name] is a 64 y.o. Female  with a past medical history significant for pulmonary sarcoidosis (on 2L [place] at baseline), pulmonary hypertension, OSA, asthma, hypertension, hypothyroidism, IBS, and chronic kidney disease who presents to [facility] ED on 07/09/20 due to altered mental status and shortness of breath.  Pt is currently confused and unable to contribute to history, and no family present, therefore history is obtained from ED and nursing notes.  Per notes, pt exhibited altered mental status for approximately 1 day, and also had associated shortness of breath, generalized weakness, and diarrhea.  She was noted to have received her influenza vaccine 24 hours ago.  She has also received 2 doses of the COVID-19 vaccine and a booster dose.  Upon presentation to the ED she is oriented only to self, and noted to have dyspnea.  Vital signs included temperature 100.36F rectally, RR 28, HR 115, BP 138/96.  Given her work of breathing, she was placed on BiPAP.  Initial workup in the ED revealed WBC 32.9 with Neutrophilia and moderate left shift, lactic acid 5.8, bicarb 21, BUN 37, Creatinine 4.16, anion gap 19, AST 113, ALT 55, total bilirubin 1.5, high sensitivity troponin 88. EKG with sinus tachycardia and no ST changes.  CT head is negative for acute process.  CT Chest is concerning for chronic pulmonary sarcoidosis with superimposed pneumonia to the RUL and bilateral lower lobes.  She met sepsis criteria, therefore she was given IV fluid resuscitation, along with IV Ceftazidime and Azithromycin.  PCCM is asked to admit the pt to ICU for further workup and treatment of Sepsis and Acute on Chronic Hypoxic Respiratory Failure in the setting of Community Acquired Pneumonia and Acute Exacerbation of Pulmonary Sarcoidosis, along with AKI and UTI.  Allergies  Allergen Reactions  . Corn-Containing Products Diarrhea    headaches  . Nitrofurantoin Nausea Only  . Tramadol     Nausea and vomiting  .  Sulfa Antibiotics Rash    As an infant    Scheduled Meds: . chlorhexidine  15 mL Mouth Rinse BID  . Chlorhexidine Gluconate Cloth  6 each Topical Q0600  . mouth rinse  15 mL Mouth Rinse q12n4p  . thiamine injection  100 mg Intravenous Daily  . Treprostinil  18 mcg Inhalation QID   Continuous Infusions: . sodium chloride 20 mL/hr at 07/11/20 1400  . sodium chloride 75 mL/hr at 07/12/20 1500  . azithromycin 500 mg (07/12/20 0740)  . cefTRIAXone (ROCEPHIN)  IV 2 g (07/12/20 1801)  . dexmedetomidine (PRECEDEX) IV infusion 1.6 mcg/kg/hr (07/12/20 1800)  . phenylephrine (NEO-SYNEPHRINE) Adult infusion Stopped (07/11/20 0130)   PRN Meds:.docusate sodium, ipratropium-albuterol, polyethylene glycol   REVIEW OF SYSTEMS:   Unable to assess due to AMS  SUBJECTIVE:  Unable to assess due to AMS  VITAL SIGNS: Temp:  [97.5 F (36.4 C)-98.6 F (37 C)] 98.6 F (37 C) (09/25 2100) Pulse Rate:  [72-109] 84 (09/25 2100) Resp:  [21-39] 22 (09/25 2100) BP: (102-145)/(65-102) 104/84 (09/25 2100) SpO2:  [88 %-100 %] 100 % (09/25 2100) FiO2 (%):  [30 %] 30 % (09/25 2100)  PHYSICAL EXAMINATION: General:  Acute on chronically ill appearing female, laying in bed, on BiPAP, with mild respiratory distress, confused Neuro: Confused, erratic following of commands, no focal deficits, pupils PERRL HEENT:  Atraumatic, normocephalic, neck supple, no JVD Cardiovascular:  Tachycardia, regular rhythm, s1s2, no M/R/G Lungs:  Diffuse rhonchi, no wheezing, mild tachypnea, mild assessory muscle use, BiPAP assisted Abdomen:  Obese, soft, nontender, nondistended, no guarding or rebound tenderness, BS+ x4 Musculoskeletal: No deformities, no edema Skin:  Warm and dry.  No obvious rashes, lesions, or ulcerations  Recent Labs  Lab 07/10/20 1418 07/10/20 2259 07/11/20 0445  NA 141 141 142  K 5.2* 5.9* 4.7  CL 106 102 103  CO2 20* 22 21*  BUN 49* 59* 66*  CREATININE 4.22* 4.57* 4.49*  GLUCOSE 91 122* 128*   Recent Labs  Lab 07/09/20 2327  07/10/20 0504 07/11/20 0445  HGB 12.2 11.2* 11.9*  HCT 38.2 34.6* 35.8*  WBC 32.9* 34.4* 26.9*  PLT 159 133* 67*   CT Head Wo Contrast  Result Date: 07/10/2020 CLINICAL DATA:  Increasing confusion and weakness EXAM: CT HEAD WITHOUT CONTRAST TECHNIQUE: Contiguous axial images were obtained from the base of the skull through the vertex without intravenous contrast. COMPARISON:  None. FINDINGS: Brain: Mild chronic white matter ischemic changes are noted. No findings to suggest acute hemorrhage, acute infarction or space-occupying mass lesion is seen. Vascular: No hyperdense vessel or unexpected calcification. Skull: Normal. Negative for fracture or focal lesion. Sinuses/Orbits: No acute finding. Other: None. IMPRESSION: Chronic white matter ischemic changes without acute abnormality Electronically Signed   By: Inez Catalina M.D.   On: 07/10/2020 01:04   CT Chest Wo Contrast  Result Date: 07/10/2020 CLINICAL DATA:  Pneumonia, dyspnea, altered mental status. Pulmonary sarcoidosis. EXAM: CT CHEST WITHOUT CONTRAST TECHNIQUE: Multidetector CT imaging of the chest was performed following the standard protocol without IV contrast. COMPARISON:  02/20/2015 FINDINGS: Cardiovascular: Cardiac size within normal limits. Mild calcification of the mitral valve annulus. No significant coronary artery calcification. No pericardial effusion. The central pulmonary arteries are enlarged in keeping with changes of pulmonary arterial hypertension. Atherosclerotic calcification is seen within the aortic arch and proximal arch vasculature as well as the descending thoracic aorta. The aorta is of normal caliber. Mediastinum/Nodes: No pathologic thoracic adenopathy. Small hiatal hernia. Lungs/Pleura: There are again identified  biapical predominant peribronchovascular interstitial thickening, bronchiectasis, and fibrosis with superior retraction of the hila in keeping with chronic changes of pulmonary sarcoidosis. Scattered  ground-glass pulmonary infiltrate is best visualized within the anterior segment of the right upper lobe, but also, subtly, within the lower lobes bilaterally in keeping with active inflammatory change. No focal pulmonary nodules. No pneumothorax or pleural effusion. Upper Abdomen: Unremarkable Musculoskeletal: no acute bone abnormality. T7, T8, and T9 vertebroplasty has been performed. Chronic appearing superior endplate fracture of J18 is noted, though is new from prior examination. IMPRESSION: Chronic changes of pulmonary sarcoidosis with biapical predominant pulmonary fibrosis. Very mild superimposed active inflammatory infiltrate. Morphologic changes compatible with pulmonary arterial hypertension. Aortic Atherosclerosis (ICD10-I70.0). Electronically Signed   By: Fidela Salisbury MD   On: 07/10/2020 01:13   DG Chest Port 1 View  Result Date: 07/09/2020 CLINICAL DATA:  Shortness of breath EXAM: PORTABLE CHEST 1 VIEW COMPARISON:  10/26/2017 FINDINGS: Cardiac shadow is stable. Chronic scarring is noted in the apices bilaterally with hilar retraction consistent with the given clinical history of prior sarcoidosis. Patchy increased airspace opacities are noted in the lower lungs which may represent some acute on chronic infiltrate. Changes of prior vertebral augmentation are seen. No acute bony abnormality is noted. IMPRESSION: Changes of scarring consistent with prior sarcoidosis. Mild patchy airspace opacities are noted in the bases increased from the prior exam. This may represent further fibrotic change although the possibility of acute on chronic infiltrate deserves consideration. Electronically Signed   By: Inez Catalina M.D.   On: 07/09/2020 23:34   US Abdomen Limited RUQ  Result Date: 07/10/2020 CLINICAL DATA:  Elevated liver enzymes EXAM: ULTRASOUND ABDOMEN LIMITED RIGHT UPPER QUADRANT COMPARISON:  CT abdomen and pelvis February 11, 2020 FINDINGS: Gallbladder: No gallstones or wall thickening visualized.  There is no pericholecystic fluid. No sonographic Murphy sign noted by sonographer. Common bile duct: Diameter: 5 mm. No intrahepatic or extrahepatic biliary duct dilatation. Liver: No focal lesion identified. Liver echogenicity overall is increased and mildly inhomogeneous. Portal vein is patent on color Doppler imaging with normal direction of blood flow towards the liver. Other: None. IMPRESSION: Liver echogenicity is overall increased and mildly inhomogeneous. This appearance most likely is indicative of hepatic steatosis, potentially with a degree of underlying parenchymal liver disease. No focal liver lesions evident. Note that the sensitivity of ultrasound for detection of focal liver lesions is somewhat diminished in this circumstance. Study otherwise unremarkable. Electronically Signed   By: Lowella Grip III M.D.   On: 07/10/2020 11:08    ASSESSMENT / PLAN:  Acute on Chronic Hypoxic Respiratory Failure secondary to acute Exacerbation of Pulmonary Sarcoidosis/ALI -Supplemental O2 as needed to maintain O2 sats >92% -BiPAP, wean as tolerated -High risk for intubation -Follow intermittent CXR & ABG as needed -ABX as above -IV Steroids, taper quickly -Bronchodilators  Acute metabolic encephalopathy Due to severe sepsis/gram-negative bacteremia Precedex General supportive care Taper steroids off quickly Support of renal function   Severe sepsis secondary to Kleibsiella pneumoniae UTI, recurrent -Monitor fever curve -Trend WBC's & Procalcitonin -Follow cultures as above -Received Azithromycin & Ceftazidime in ED -Discontinued Vancomycin & Cefepime -Continue Rocephin   AKI -Anion Gap Metabolic Acidosis -Monitor I&O's / urinary output -Follow BMP -Ensure adequate renal perfusion -Avoid nephrotoxic agents as able -Replace electrolytes as indicated -Na Bicarb -Nephrology consulted, appreciate input   Mildly elevated troponin, suspected demand ischemia -Continuous  cardiac monitoring -Maintain MAP >65 -Pt without chest pain, EKG without ST changes -Trend troponin until downtrending  Transaminitis -NPO -Trend LFT's -Obtain Abdominal US   BEST PRACTICES DISPOSITION: ICU GOALS OF CARE: Full Code VTE Prophylaxis: Heparin SQ Consults: Nephrology Updates: No family at bedside for update 07/11/20 during MD rounds   C. Derrill Kay, MD [facility]  07/11/2020, 11:16 PM  *This note was dictated using voice recognition software/Dragon.  Despite best efforts to proofread, errors can occur which can change the meaning.  Any change was purely unintentional.

## 2020-07-12 DIAGNOSIS — R6520 Severe sepsis without septic shock: Secondary | ICD-10-CM | POA: Diagnosis not present

## 2020-07-12 DIAGNOSIS — A414 Sepsis due to anaerobes: Secondary | ICD-10-CM | POA: Diagnosis not present

## 2020-07-12 DIAGNOSIS — A419 Sepsis, unspecified organism: Secondary | ICD-10-CM | POA: Diagnosis not present

## 2020-07-12 LAB — COMPREHENSIVE METABOLIC PANEL
ALT: 211 U/L — ABNORMAL HIGH (ref 0–44)
AST: 190 U/L — ABNORMAL HIGH (ref 15–41)
Albumin: 2.6 g/dL — ABNORMAL LOW (ref 3.5–5.0)
Alkaline Phosphatase: 102 U/L (ref 38–126)
Anion gap: 16 — ABNORMAL HIGH (ref 5–15)
BUN: 89 mg/dL — ABNORMAL HIGH (ref 8–23)
CO2: 24 mmol/L (ref 22–32)
Calcium: 7.1 mg/dL — ABNORMAL LOW (ref 8.9–10.3)
Chloride: 103 mmol/L (ref 98–111)
Creatinine, Ser: 3.75 mg/dL — ABNORMAL HIGH (ref 0.44–1.00)
GFR calc Af Amer: 14 mL/min — ABNORMAL LOW (ref >60)
GFR calc non Af Amer: 12 mL/min — ABNORMAL LOW (ref >60)
Glucose, Bld: 164 mg/dL — ABNORMAL HIGH (ref 70–99)
Potassium: 4.8 mmol/L (ref 3.5–5.1)
Sodium: 143 mmol/L (ref 135–145)
Total Bilirubin: 0.7 mg/dL (ref 0.3–1.2)
Total Protein: 6.1 g/dL — ABNORMAL LOW (ref 6.5–8.1)

## 2020-07-12 LAB — BLOOD GAS, ARTERIAL
Acid-base deficit: 0.5 mmol/L (ref 0.0–2.0)
Allens test (pass/fail): POSITIVE — AB
Bicarbonate: 24.2 mmol/L (ref 20.0–28.0)
Delivery systems: POSITIVE
Expiratory PAP: 8
FIO2: 30
MECHVT: 550 mL
O2 Saturation: 97.4 %
Patient temperature: 37
RATE: 12 resp/min
pCO2 arterial: 39 mmHg (ref 32.0–48.0)
pH, Arterial: 7.4 (ref 7.350–7.450)
pO2, Arterial: 95 mmHg (ref 83.0–108.0)

## 2020-07-12 LAB — CBC WITH DIFFERENTIAL/PLATELET
Abs Immature Granulocytes: 0.27 10*3/uL — ABNORMAL HIGH (ref 0.00–0.07)
Basophils Absolute: 0 10*3/uL (ref 0.0–0.1)
Basophils Relative: 0 %
Eosinophils Absolute: 0.1 10*3/uL (ref 0.0–0.5)
Eosinophils Relative: 0 %
HCT: 35.6 % — ABNORMAL LOW (ref 36.0–46.0)
Hemoglobin: 12 g/dL (ref 12.0–15.0)
Immature Granulocytes: 1 %
Lymphocytes Relative: 2 %
Lymphs Abs: 0.7 10*3/uL (ref 0.7–4.0)
MCH: 31.1 pg (ref 26.0–34.0)
MCHC: 33.7 g/dL (ref 30.0–36.0)
MCV: 92.2 fL (ref 80.0–100.0)
Monocytes Absolute: 0.7 10*3/uL (ref 0.1–1.0)
Monocytes Relative: 2 %
Neutro Abs: 31.5 10*3/uL — ABNORMAL HIGH (ref 1.7–7.7)
Neutrophils Relative %: 95 %
Platelets: 33 10*3/uL — ABNORMAL LOW (ref 150–400)
RBC: 3.86 MIL/uL — ABNORMAL LOW (ref 3.87–5.11)
RDW: 13.9 % (ref 11.5–15.5)
Smear Review: NORMAL
WBC: 33.4 10*3/uL — ABNORMAL HIGH (ref 4.0–10.5)
nRBC: 0.2 % (ref 0.0–0.2)

## 2020-07-12 LAB — CULTURE, BLOOD (ROUTINE X 2)
Special Requests: ADEQUATE
Special Requests: ADEQUATE

## 2020-07-12 LAB — GLUCOSE, CAPILLARY
Glucose-Capillary: 173 mg/dL — ABNORMAL HIGH (ref 70–99)
Glucose-Capillary: 159 mg/dL — ABNORMAL HIGH (ref 70–99)
Glucose-Capillary: 153 mg/dL — ABNORMAL HIGH (ref 70–99)

## 2020-07-12 LAB — URINE CULTURE: Culture: >=100000 — AB

## 2020-07-12 LAB — PHOSPHORUS: Phosphorus: 6.8 mg/dL — ABNORMAL HIGH (ref 2.5–4.6)

## 2020-07-12 LAB — MAGNESIUM: Magnesium: 2.8 mg/dL — ABNORMAL HIGH (ref 1.7–2.4)

## 2020-07-12 LAB — PROCALCITONIN: Procalcitonin: 76.6 ng/mL

## 2020-07-12 MED ORDER — THIAMINE HCL 100 MG/ML IJ SOLN
100.0000 mg | Freq: Every day | INTRAMUSCULAR | Status: DC
  Administered 2020-07-12 – 2020-07-27 (×16): 100 mg via INTRAVENOUS
  Filled 2020-07-12 (×16): qty 2

## 2020-07-12 MED ORDER — ARFORMOTEROL TARTRATE 15 MCG/2ML IN NEBU
15.0000 ug | INHALATION_SOLUTION | Freq: Two times a day (BID) | RESPIRATORY_TRACT | Status: DC
  Administered 2020-07-12 – 2020-07-28 (×29): 15 ug via RESPIRATORY_TRACT
  Filled 2020-07-12 (×36): qty 2

## 2020-07-12 MED ORDER — BUDESONIDE 0.25 MG/2ML IN SUSP
0.2500 mg | Freq: Two times a day (BID) | RESPIRATORY_TRACT | Status: DC
  Administered 2020-07-12 – 2020-07-28 (×29): 0.25 mg via RESPIRATORY_TRACT
  Filled 2020-07-12 (×29): qty 2

## 2020-07-12 MED ORDER — SODIUM CHLORIDE 0.45 % IV SOLN
INTRAVENOUS | Status: DC
  Filled 2020-07-12 (×2): qty 1000

## 2020-07-12 MED ORDER — PANTOPRAZOLE SODIUM 40 MG IV SOLR
40.0000 mg | Freq: Every day | INTRAVENOUS | Status: DC
  Administered 2020-07-13 – 2020-07-22 (×11): 40 mg via INTRAVENOUS
  Filled 2020-07-12 (×11): qty 40

## 2020-07-12 MED ORDER — TREPROSTINIL 0.6 MG/ML IN SOLN
18.0000 ug | Freq: Four times a day (QID) | RESPIRATORY_TRACT | Status: DC
  Administered 2020-07-13 – 2020-07-28 (×28): 18 ug via RESPIRATORY_TRACT

## 2020-07-12 NOTE — Progress Notes (Signed)
PHARMACY CONSULT NOTE - FOLLOW UP  Pharmacy Consult for Electrolyte Monitoring and Replacement   Recent Labs: Potassium (mmol/L)  Date Value  07/12/2020 4.8  12/28/2012 3.4 (L)   Magnesium (mg/dL)  Date Value  07/12/2020 2.8 (H)   Calcium (mg/dL)  Date Value  07/12/2020 7.1 (L)   Calcium, Total (mg/dL)  Date Value  12/28/2012 8.0 (L)   Albumin (g/dL)  Date Value  07/12/2020 2.6 (L)  05/27/2019 4.1  12/26/2012 3.0 (L)   Phosphorus (mg/dL)  Date Value  07/12/2020 6.8 (H)   Sodium (mmol/L)  Date Value  07/12/2020 143  05/27/2019 144  12/28/2012 142     Assessment: Pharmacy consulted to replace electrolytes in this 64 year old female admitted with sepsis ,  Stage 3 CKD. Nephro following.    Goal of Therapy:  Electrolytes WNL   Plan:  No replacement needed at this time.    Will recheck electrolytes with AM labs.   Oswald Hillock ,PharmD, BCPS Clinical Pharmacist 07/12/2020 9:17 AM

## 2020-07-12 NOTE — Progress Notes (Signed)
Stacie [name]  MRN: [id number]  DOB/AGE: [birth year] 64 y.o.  Primary Care Physician:Berglund, Nyoka Cowden, MD  Admit date: 07/09/2020  Chief Complaint:  Chief Complaint  Patient presents with  . Shortness of Breath  . Altered Mental Status    S-Pt presented on  07/09/2020 with  Chief Complaint  Patient presents with  . Shortness of Breath  . Altered Mental Status  .    Pt is lethargic unable to offer any complaints. I did reach out to patient's husband to inform him about kidney related issues.  Medications . chlorhexidine  15 mL Mouth Rinse BID  . Chlorhexidine Gluconate Cloth  6 each Topical Q0600  . mouth rinse  15 mL Mouth Rinse q12n4p  . methylPREDNISolone (SOLU-MEDROL) injection  40 mg Intravenous Q12H         ROS: Unable to get any data   Physical Exam: Vital signs in last 24 hours: Temp:  [97.6 F (36.4 C)-99.7 F (37.6 C)] 97.7 F (36.5 C) (09/26 0730) Pulse Rate:  [77-89] 79 (09/26 1100) Resp:  [17-38] 38 (09/26 0800) BP: (104-158)/(50-109) 145/94 (09/26 1100) SpO2:  [97 %-100 %] 99 % (09/26 1100) FiO2 (%):  [30 %] 30 % (09/26 0800) Weight:  [90.6 kg] 90.6 kg (09/26 0500) Weight change: 6.7 kg    Intake/Output from previous day: 09/25 0701 - 09/26 0700 In: 2245.9 [I.V.:1895.9; IV Piggyback:350] Out: 1205 [Urine:1205] Total I/O In: 306.7 [I.V.:306.7] Out: 760 [Urine:760]   Physical Exam: General- pt is awake,alert, oriented to time place and person HEENT- Bipap in situ  Resp- moderate REsp distress,  Rhonchi present CVS- S1S2 regular in rate and rhythm GIT- BS+, soft, NT, ND EXT- NO LE Edema, Cyanosis   Lab Results: CBC Recent Labs    07/11/20 0445 07/12/20 0735  WBC 26.9* 33.4*  HGB 11.9* 12.0  HCT 35.8* 35.6*  PLT 67* 33*    BMET Recent Labs    07/11/20 0445 07/12/20 0735  NA 142 143  K 4.7 4.8  CL 103 103  CO2 21* 24  GLUCOSE 128* 164*  BUN 66* 89*  CREATININE 4.49* 3.75*  CALCIUM 7.2* 7.1*    Creatinine  trend 2021 4.1==>4.6==>4.5==>3.75 2020  1.1 2019 1.1  2018 1.03 2017 1.0--1.23 2016 1.1--1.6 2015 1.1--1.3 2014 1.2--1.4         MICRO Recent Results (from the past 240 hour(s))  Urine culture     Status: Abnormal   Collection Time: 07/09/20 11:27 PM   Specimen: In/Out Cath Urine  Result Value Ref Range Status   Specimen Description   Final    IN/OUT CATH URINE Performed at [facility], [address] [place], [place] [address]    Special Requests   Final    NONE Performed at [facility], [address] [place], [place] [address]    Culture >=100,000 COLONIES/mL KLEBSIELLA PNEUMONIAE (A)  Final   Report Status 07/12/2020 FINAL  Final   Organism ID, Bacteria KLEBSIELLA PNEUMONIAE (A)  Final      Susceptibility   Klebsiella pneumoniae - MIC*    AMPICILLIN RESISTANT Resistant     CEFAZOLIN <=4 SENSITIVE Sensitive     CEFTRIAXONE <=0.25 SENSITIVE Sensitive     CIPROFLOXACIN <=0.25 SENSITIVE Sensitive     GENTAMICIN <=1 SENSITIVE Sensitive     IMIPENEM <=0.25 SENSITIVE Sensitive     NITROFURANTOIN <=16 SENSITIVE Sensitive     TRIMETH/SULFA <=20 SENSITIVE Sensitive     AMPICILLIN/SULBACTAM <=2 SENSITIVE Sensitive  PIP/TAZO <=4 SENSITIVE Sensitive     * >=100,000 COLONIES/mL KLEBSIELLA PNEUMONIAE  Respiratory Panel by RT PCR (Flu A&B, Covid) - Nasopharyngeal Swab     Status: None   Collection Time: 07/09/20 11:28 PM   Specimen: Nasopharyngeal Swab  Result Value Ref Range Status   SARS Coronavirus 2 by RT PCR NEGATIVE NEGATIVE Final    Comment: (NOTE) SARS-CoV-2 target nucleic acids are NOT DETECTED.  The SARS-CoV-2 RNA is generally detectable in upper respiratoy specimens during the acute phase of infection. The lowest concentration of SARS-CoV-2 viral copies this assay can detect is 131 copies/mL. A negative result does not preclude SARS-Cov-2 infection and should not be used as the sole basis for treatment or other patient management  decisions. A negative result may occur with  improper specimen collection/handling, submission of specimen other than nasopharyngeal swab, presence of viral mutation(s) within the areas targeted by this assay, and inadequate number of viral copies (<131 copies/mL). A negative result must be combined with clinical observations, patient history, and epidemiological information. The expected result is Negative.  Fact Sheet for Patients:  https://www.moore.com/  Fact Sheet for Healthcare Providers:  https://www.young.biz/  This test is no t yet approved or cleared by the [place] FDA and  has been authorized for detection and/or diagnosis of SARS-CoV-2 by FDA under an Emergency Use Authorization (EUA). This EUA will remain  in effect (meaning this test can be used) for the duration of the COVID-19 declaration under Section 564(b)(1) of the Act, 21 U.S.C. section 360bbb-3(b)(1), unless the authorization is terminated or revoked sooner.     Influenza A by PCR NEGATIVE NEGATIVE Final   Influenza B by PCR NEGATIVE NEGATIVE Final    Comment: (NOTE) The Xpert Xpress SARS-CoV-2/FLU/RSV assay is intended as an aid in  the diagnosis of influenza from Nasopharyngeal swab specimens and  should not be used as a sole basis for treatment. Nasal washings and  aspirates are unacceptable for Xpert Xpress SARS-CoV-2/FLU/RSV  testing.  Fact Sheet for Patients: https://www.moore.com/  Fact Sheet for Healthcare Providers: https://www.young.biz/  This test is not yet approved or cleared by the [place] FDA and  has been authorized for detection and/or diagnosis of SARS-CoV-2 by  FDA under an Emergency Use Authorization (EUA). This EUA will remain  in effect (meaning this test can be used) for the duration of the  Covid-19 declaration under Section 564(b)(1) of the Act, 21  U.S.C. section 360bbb-3(b)(1), unless the  authorization is  terminated or revoked. Performed at [facility], [address] [place], [place] [address]   Blood Culture (routine x 2)     Status: Abnormal   Collection Time: 07/09/20 11:36 PM   Specimen: BLOOD  Result Value Ref Range Status   Specimen Description   Final    BLOOD RIGHT ANTECUBITAL Performed at [facility], [address], [place], [place] [address]    Special Requests   Final    BOTTLES DRAWN AEROBIC AND ANAEROBIC Blood Culture adequate volume Performed at [facility], [address] [place], [place] [address]    Culture  Setup Time   Final    Organism ID to follow GRAM NEGATIVE RODS IN BOTH AEROBIC AND ANAEROBIC BOTTLES CRITICAL RESULT CALLED TO, READ BACK BY AND VERIFIED WITH: AMY THOMPSON AT 1220 07/10/20 SDR Performed at [facility], [address] [place], [place] [address]    Culture KLEBSIELLA PNEUMONIAE (A)  Final   Report Status 07/12/2020 FINAL  Final  Organism ID, Bacteria KLEBSIELLA PNEUMONIAE  Final      Susceptibility   Klebsiella pneumoniae - MIC*    AMPICILLIN RESISTANT Resistant     CEFAZOLIN <=4 SENSITIVE Sensitive     CEFEPIME <=0.12 SENSITIVE Sensitive     CEFTAZIDIME <=1 SENSITIVE Sensitive     CEFTRIAXONE <=0.25 SENSITIVE Sensitive     CIPROFLOXACIN <=0.25 SENSITIVE Sensitive     GENTAMICIN <=1 SENSITIVE Sensitive     IMIPENEM <=0.25 SENSITIVE Sensitive     TRIMETH/SULFA <=20 SENSITIVE Sensitive     AMPICILLIN/SULBACTAM <=2 SENSITIVE Sensitive     PIP/TAZO <=4 SENSITIVE Sensitive     * KLEBSIELLA PNEUMONIAE  Blood Culture ID Panel (Reflexed)     Status: Abnormal   Collection Time: 07/09/20 11:36 PM  Result Value Ref Range Status   Enterococcus faecalis NOT DETECTED NOT DETECTED Final   Enterococcus Faecium NOT DETECTED NOT DETECTED Final   Listeria monocytogenes NOT DETECTED NOT DETECTED Final   Staphylococcus species NOT DETECTED NOT DETECTED Final   Staphylococcus aureus  (BCID) NOT DETECTED NOT DETECTED Final   Staphylococcus epidermidis NOT DETECTED NOT DETECTED Final   Staphylococcus lugdunensis NOT DETECTED NOT DETECTED Final   Streptococcus species NOT DETECTED NOT DETECTED Final   Streptococcus agalactiae NOT DETECTED NOT DETECTED Final   Streptococcus pneumoniae NOT DETECTED NOT DETECTED Final   Streptococcus pyogenes NOT DETECTED NOT DETECTED Final   A.calcoaceticus-baumannii NOT DETECTED NOT DETECTED Final   Bacteroides fragilis NOT DETECTED NOT DETECTED Final   Enterobacterales DETECTED (A) NOT DETECTED Final    Comment: Enterobacterales represent a large order of gram negative bacteria, not a single organism. CRITICAL RESULT CALLED TO, READ BACK BY AND VERIFIED WITH:  AMY THOMPSON AT 1220 04/09/20 SDR    Enterobacter cloacae complex NOT DETECTED NOT DETECTED Final   Escherichia coli NOT DETECTED NOT DETECTED Final   Klebsiella aerogenes NOT DETECTED NOT DETECTED Final   Klebsiella oxytoca NOT DETECTED NOT DETECTED Final   Klebsiella pneumoniae DETECTED (A) NOT DETECTED Final    Comment: CRITICAL RESULT CALLED TO, READ BACK BY AND VERIFIED WITH:  AMY THOMPSON AT 1220 07/10/20 SDR    Proteus species NOT DETECTED NOT DETECTED Final   Salmonella species NOT DETECTED NOT DETECTED Final   Serratia marcescens NOT DETECTED NOT DETECTED Final   Haemophilus influenzae NOT DETECTED NOT DETECTED Final   Neisseria meningitidis NOT DETECTED NOT DETECTED Final   Pseudomonas aeruginosa NOT DETECTED NOT DETECTED Final   Stenotrophomonas maltophilia NOT DETECTED NOT DETECTED Final   Candida albicans NOT DETECTED NOT DETECTED Final   Candida auris NOT DETECTED NOT DETECTED Final   Candida glabrata NOT DETECTED NOT DETECTED Final   Candida krusei NOT DETECTED NOT DETECTED Final   Candida parapsilosis NOT DETECTED NOT DETECTED Final   Candida tropicalis NOT DETECTED NOT DETECTED Final   Cryptococcus neoformans/gattii NOT DETECTED NOT DETECTED Final   CTX-M  ESBL NOT DETECTED NOT DETECTED Final   Carbapenem resistance IMP NOT DETECTED NOT DETECTED Final   Carbapenem resistance KPC NOT DETECTED NOT DETECTED Final   Carbapenem resistance NDM NOT DETECTED NOT DETECTED Final   Carbapenem resist OXA 48 LIKE NOT DETECTED NOT DETECTED Final   Carbapenem resistance VIM NOT DETECTED NOT DETECTED Final    Comment: Performed at [facility], [address] [place], [place] [address]  Blood Culture (routine x 2)     Status: Abnormal   Collection Time: 07/09/20 11:37 PM   Specimen: BLOOD  Result Value Ref Range Status  Specimen Description   Final    BLOOD BLOOD RIGHT FOREARM Performed at [facility], [address], [place], [place] [address]    Special Requests   Final    BOTTLES DRAWN AEROBIC AND ANAEROBIC Blood Culture adequate volume Performed at [facility], [address] [place], [place] [address]    Culture  Setup Time   Final    GRAM NEGATIVE RODS IN BOTH AEROBIC AND ANAEROBIC BOTTLES CRITICAL RESULT CALLED TO, READ BACK BY AND VERIFIED WITH: AMY THOMPSON AT 1220 07/10/20 SDR Performed at [facility], [address] [place], [place] [address]    Culture (A)  Final    KLEBSIELLA PNEUMONIAE SUSCEPTIBILITIES PERFORMED ON PREVIOUS CULTURE WITHIN THE LAST 5 DAYS. Performed at [facility], [address] [place], [place] [address]    Report Status 07/12/2020 FINAL  Final  MRSA PCR Screening     Status: None   Collection Time: 07/10/20 12:54 PM   Specimen: Nasal Mucosa; Nasopharyngeal  Result Value Ref Range Status   MRSA by PCR NEGATIVE NEGATIVE Final    Comment:        The GeneXpert MRSA Assay (FDA approved for NASAL specimens only), is one component of a comprehensive MRSA colonization surveillance program. It is not intended to diagnose MRSA infection nor to guide or monitor treatment for MRSA infections. Performed at [facility], [address] [place],  [place] [address]       Lab Results  Component Value Date   CALCIUM 7.1 (L) 07/12/2020   CAION 1.05 (L) 02/20/2020   PHOS 6.8 (H) 07/12/2020           Impression:   Stacie [name] is a 64 year old Caucasian female with sarcoidosis, interstitial lung disease, obstructive sleep apnea, depression, GERD, hyperlipidemia, hypothyroidism, who was admitted to [facility] on 07/09/2020 for Sepsis (HCC) [A41.9]   1)Renal  AKI secondary to ATN Patient has AKI secondary to hypotension-patient required vasopressors/sepsis Patient has AKI on CKD Patient has CKD stage IIIa Patient has CKD stage IIIa since 2016 Patient CKD could be secondary to her past medical history of sarcoid  Patient AKI is currently at plateau Non Oliguric Urine output is better than before   2)HTN Blood pressure stable   3)Anemia of chronic disease  HGb at goal (9--11)   4) secondary hyperparathyroidism -CKD Mineral-Bone Disorder   Secondary Hyperparathyroidism  present   Phosphorus is not at goal.   5) sepsis Patient is currently on broad-spectrum antibiotics Stable   6) electrolytes   sodium Normonatremic   potassium Hyperkalemia Now better   7)Acid base Co2 is at  Goal  Patient has high anion gap acidosis Patient has acidosis secondary to lactic acidosis  Now better   8) acute respiratory failure Patient was intubated, still on BIpap  9) thrombocytopenia Patient platelet counts is trending down      Plan:   As patient bicarb is now better We will DC bicarb drip We will continue patient on normal saline NO acute indication for renal replacement therapy      Kessler Kopinski s Khalifa Knecht 07/12/2020, 11:36 AM

## 2020-07-12 NOTE — Progress Notes (Signed)
Name: Stacie [name] MRN: [id number] DOB: 14-Apr-1956    ADMISSION DATE:  07/09/2020 INITIAL CONSULTATION DATE:  07/10/2020  REFERRING MD :  Dr. Owens Shark  CHIEF COMPLAINT:  Altered Mental Status & Shortness of Breath  BRIEF PATIENT DESCRIPTION:  64 y.o. Female admitted with Sepsis and Acute on Chronic Hypoxic Respiratory Failure in the setting of Community Acquired Pneumonia and Acute Exacerbation of Pulmonary Sarcoidosis requiring BiPAP. Pt also with UTI and AKI, Nephrology has been consulted.  SIGNIFICANT EVENTS  9/23: Presented to ED, placed on BiPAP 9/24: PCCM asked to admit 9/25: Klebsiella pneumoniae bacteremia due to UTI, associated encephalopathy 9/26: Persistent encephalopathy  STUDIES:  9/23: CXR>>Cardiac shadow is stable. Chronic scarring is noted in the apices bilaterally with hilar retraction consistent with the given clinical history of prior sarcoidosis. Patchy increased airspace opacities are noted in the lower lungs which may represent some acute on chronic infiltrate. Changes of prior vertebral augmentation are seen. No acute bony abnormality is noted. 9/24: CT Head>>Chronic white matter ischemic changes without acute abnormality 9/24: CT Chest w/o Contrast>>Chronic changes of pulmonary sarcoidosis with biapical predominant pulmonary fibrosis. Very mild superimposed active inflammatory infiltrate. Morphologic changes compatible with pulmonary arterial hypertension.  CULTURES: SARS-CoV-2 PCR 9/23>> negative Influenza A&B 9/23>>negative Respiratory Viral Panel 9/24>> negative Blood culture 9/23>> Klebsiella pneumoniae all 4 bottles Sputum 9/24>> unable to obtain, nonproductive Urine 9/23>> Klebsiella pneumonia Strep pneumo urinary antigen 9/24>> not obtained Legionella urinary antigen 9/24>> not obtained Aspergillus 9/24>> pending Fungitel 9/24>> pending C. Diff 9/24>> no sample  ANTIBIOTICS: Azithromycin x1 dose 9/23 Ceftazidime x1 dose 9/23 Cefepime  9/24>> 9/24 Vancomycin 9/24>> 9/24 Ceftriaxone 9/24>>  PATIENT PROFILE:   Stacie [name] is a 64 y.o. Female with a past medical history significant for pulmonary sarcoidosis (on 2L [place] at baseline), pulmonary hypertension, OSA, asthma, hypertension, hypothyroidism, IBS, and chronic kidney disease who presents to [facility] ED on 07/09/20 due to altered mental status and shortness of breath.  Pt is currently confused and unable to contribute to history, and no family present, therefore history is obtained from ED and nursing notes.  PCCM asked to admit patient.  Patient has Klebsiella pneumonia bacteremia and severe sepsis.  Encephalopathy secondary to the same.   Allergies  Allergen Reactions  . Corn-Containing Products Diarrhea    headaches  . Nitrofurantoin Nausea Only  . Tramadol     Nausea and vomiting  . Sulfa Antibiotics Rash    As an infant   Scheduled Meds: . arformoterol  15 mcg Nebulization BID  . budesonide (PULMICORT) nebulizer solution  0.25 mg Nebulization BID  . chlorhexidine  15 mL Mouth Rinse BID  . Chlorhexidine Gluconate Cloth  6 each Topical Q0600  . mouth rinse  15 mL Mouth Rinse q12n4p  . pantoprazole (PROTONIX) IV  40 mg Intravenous QHS  . thiamine injection  100 mg Intravenous Daily  . Treprostinil  18 mcg Inhalation QID   Continuous Infusions: . sodium chloride 20 mL/hr at 07/11/20 1400  . sodium chloride 75 mL/hr at 07/12/20 1500  . azithromycin 500 mg (07/12/20 0740)  . cefTRIAXone (ROCEPHIN)  IV 2 g (07/12/20 1801)  . dexmedetomidine (PRECEDEX) IV infusion 1.6 mcg/kg/hr (07/12/20 1800)  . phenylephrine (NEO-SYNEPHRINE) Adult infusion Stopped (07/11/20 0130)   PRN Meds:.docusate sodium, ipratropium-albuterol, polyethylene glycol   REVIEW OF SYSTEMS:   Unable to assess due to AMS  SUBJECTIVE:  Unable to assess due to AMS  VITAL SIGNS: Temp:  [97.7 F (36.5 C)-99.7 F (37.6 C)] 98.1 F (36.7  C) (09/26 2000) Pulse Rate:  [61-94] 93 (09/26  2000) Resp:  [28-42] 42 (09/26 2000) BP: (132-158)/(50-115) 132/114 (09/26 2000) SpO2:  [95 %-100 %] 97 % (09/26 2000) FiO2 (%):  [30 %] 30 % (09/26 2000) Weight:  [90.6 kg] 90.6 kg (09/26 0500)  PHYSICAL EXAMINATION: General:  Acute on chronically ill appearing female, laying in bed, on BiPAP, with mild respiratory distress, confused Neuro: Confused, erratic following of commands, no focal deficits, pupils PERRL HEENT:  Atraumatic, normocephalic, neck supple, no JVD Cardiovascular: Regular rate and rhythm, s1s2, no M/R/G Lungs:  Diffuse rhonchi, no wheezing, mild tachypnea, mild assessory muscle use, BiPAP assisted Abdomen:  Obese, soft, nontender, nondistended, no guarding or rebound tenderness, BS+ x4 Musculoskeletal: No deformities, no edema Skin:  Warm and dry.  No obvious rashes, lesions, or ulcerations  Recent Labs  Lab 07/10/20 2259 07/11/20 0445 07/12/20 0735  NA 141 142 143  K 5.9* 4.7 4.8  CL 102 103 103  CO2 22 21* 24  BUN 59* 66* 89*  CREATININE 4.57* 4.49* 3.75*  GLUCOSE 122* 128* 164*   Recent Labs  Lab 07/10/20 0504 07/11/20 0445 07/12/20 0735  HGB 11.2* 11.9* 12.0  HCT 34.6* 35.8* 35.6*  WBC 34.4* 26.9* 33.4*  PLT 133* 67* 33*   ABG    Component Value Date/Time   PHART 7.40 07/12/2020 1205   PCO2ART 39 07/12/2020 1205   PO2ART 95 07/12/2020 1205   HCO3 24.2 07/12/2020 1205   TCO2 29 02/20/2020 1156   ACIDBASEDEF 0.5 07/12/2020 1205   O2SAT 97.4 07/12/2020 1205     ASSESSMENT / PLAN:  Acute on Chronic Hypoxic Respiratory Failure secondary to acute Exacerbation of Pulmonary Sarcoidosis/ALI -Supplemental O2 as needed to maintain O2 sats >92% -BiPAP, wean as tolerated -High risk for intubation -Follow intermittent CXR & ABG as needed -ABX as above -IV Steroids, DC'd -LABA/ICS: Arformoterol and Pulmicort  Acute metabolic encephalopathy Due to severe sepsis/gram-negative bacteremia Uremia Precedex General supportive care Discontinue  steroids Support of renal function Thiamine Check free T4, TSH low   Severe sepsis secondary to Kleibsiella pneumoniae UTI, recurrent -Monitor fever curve -Trend WBC's & Procalcitonin -Follow cultures as above -Received Azithromycin & Ceftazidime in ED -Discontinued Vancomycin & Cefepime -Continue Rocephin   AKI -Anion Gap Metabolic Acidosis, corrected -Monitor I&O's / urinary output -Follow BMP -Ensure adequate renal perfusion -Avoid nephrotoxic agents as able -Replace electrolytes as indicated -Na Bicarb alternated with normal saline per renal -Nephrology consulted, appreciate input   Mildly elevated troponin, suspected demand ischemia -Continuous cardiac monitoring -Maintain MAP >65 -EKG without ST changes   Transaminitis -NPO -Trend LFT's -Obtain Abdominal US   BEST PRACTICES DISPOSITION: ICU GOALS OF CARE: Full Code VTE Prophylaxis: Heparin SQ Consults: Nephrology Updates: No family at bedside for update 07/12/20 during MD rounds   C. Derrill Kay, MD [place] [facility]  07/12/2020, 9:24 PM  *This note was dictated using voice recognition software/Dragon.  Despite best efforts to proofread, errors can occur which can change the meaning.  Any change was purely unintentional.

## 2020-07-12 NOTE — Plan of Care (Signed)
  Problem: Health Behavior/Discharge Planning: Goal: Ability to manage health-related needs will improve Outcome: Not Progressing   Problem: Clinical Measurements: Goal: Ability to maintain clinical measurements within normal limits will improve Outcome: Progressing   Problem: Activity: Goal: Risk for activity intolerance will decrease Outcome: Not Progressing   Problem: Nutrition: Goal: Adequate nutrition will be maintained Outcome: Not Progressing   Problem: Coping: Goal: Level of anxiety will decrease Outcome: Not Progressing   Problem: Elimination: Goal: Will not experience complications related to bowel motility Outcome: Not Progressing   Problem: Pain Managment: Goal: General experience of comfort will improve Outcome: Not Progressing   Problem: Safety: Goal: Ability to remain free from injury will improve Outcome: Not Progressing   Problem: Skin Integrity: Goal: Risk for impaired skin integrity will decrease Outcome: Not Progressing

## 2020-07-12 NOTE — Progress Notes (Signed)
Shift summary:  - Continued tachypnea and labored breathing this morning. - Remains delirious. - Remains NPO due to inability to protect airway. - High risk for falls; safety measures in place.

## 2020-07-12 NOTE — Progress Notes (Signed)
Shift summary:  - Patient is back on BiPAP since about 9 pm due to respiratory distress.    - Delirium and agitation persist; Patient attempting to get out bed. Bed alarm on and functioning without difficulties noted. Provider increased the limits for precedex. Morphine given for air hunger.

## 2020-07-13 ENCOUNTER — Inpatient Hospital Stay
Admit: 2020-07-13 | Discharge: 2020-07-13 | Disposition: A | Discharge status: Home / Self Care | Payer: BC Managed Care – PPO | Attending: Pulmonary Disease | Admitting: Pulmonary Disease

## 2020-07-13 ENCOUNTER — Inpatient Hospital Stay: Payer: BC Managed Care – PPO

## 2020-07-13 DIAGNOSIS — B961 Klebsiella pneumoniae [K. pneumoniae] as the cause of diseases classified elsewhere: Secondary | ICD-10-CM

## 2020-07-13 DIAGNOSIS — G9340 Encephalopathy, unspecified: Secondary | ICD-10-CM

## 2020-07-13 DIAGNOSIS — R7881 Bacteremia: Secondary | ICD-10-CM

## 2020-07-13 LAB — CBC WITH DIFFERENTIAL/PLATELET
Abs Immature Granulocytes: 0.23 10*3/uL — ABNORMAL HIGH (ref 0.00–0.07)
Basophils Absolute: 0.1 10*3/uL (ref 0.0–0.1)
Basophils Relative: 0 %
Eosinophils Absolute: 0 10*3/uL (ref 0.0–0.5)
Eosinophils Relative: 0 %
HCT: 37.7 % (ref 36.0–46.0)
Hemoglobin: 12.1 g/dL (ref 12.0–15.0)
Immature Granulocytes: 1 %
Lymphocytes Relative: 4 %
Lymphs Abs: 0.7 10*3/uL (ref 0.7–4.0)
MCH: 30.7 pg (ref 26.0–34.0)
MCHC: 32.1 g/dL (ref 30.0–36.0)
MCV: 95.7 fL (ref 80.0–100.0)
Monocytes Absolute: 1 10*3/uL (ref 0.1–1.0)
Monocytes Relative: 6 %
Neutro Abs: 15.1 10*3/uL — ABNORMAL HIGH (ref 1.7–7.7)
Neutrophils Relative %: 89 %
Platelets: 24 10*3/uL — CL (ref 150–400)
RBC: 3.94 MIL/uL (ref 3.87–5.11)
RDW: 14 % (ref 11.5–15.5)
WBC: 17.2 10*3/uL — ABNORMAL HIGH (ref 4.0–10.5)
nRBC: 0.6 % — ABNORMAL HIGH (ref 0.0–0.2)

## 2020-07-13 LAB — PROTIME-INR
INR: 1.4 — ABNORMAL HIGH (ref 0.8–1.2)
Prothrombin Time: 16.9 seconds — ABNORMAL HIGH (ref 11.4–15.2)

## 2020-07-13 LAB — ECHOCARDIOGRAM COMPLETE
AR max vel: 2 cm2
AV Area VTI: 2 cm2
AV Area mean vel: 1.93 cm2
AV Mean grad: 3 mmHg
AV Peak grad: 5.3 mmHg
Ao pk vel: 1.15 m/s
Area-P 1/2: 3.97 cm2
Calc EF: 41.9 %
Height: 67 in
S' Lateral: 3.98 cm
Single Plane A2C EF: 38.7 %
Single Plane A4C EF: 36.5 %
Weight: 3118.19 oz

## 2020-07-13 LAB — COMPREHENSIVE METABOLIC PANEL
ALT: 207 U/L — ABNORMAL HIGH (ref 0–44)
AST: 145 U/L — ABNORMAL HIGH (ref 15–41)
Albumin: 2.4 g/dL — ABNORMAL LOW (ref 3.5–5.0)
Alkaline Phosphatase: 93 U/L (ref 38–126)
Anion gap: 13 (ref 5–15)
BUN: 88 mg/dL — ABNORMAL HIGH (ref 8–23)
CO2: 27 mmol/L (ref 22–32)
Calcium: 6.6 mg/dL — ABNORMAL LOW (ref 8.9–10.3)
Chloride: 107 mmol/L (ref 98–111)
Creatinine, Ser: 3.12 mg/dL — ABNORMAL HIGH (ref 0.44–1.00)
GFR calc Af Amer: 17 mL/min — ABNORMAL LOW (ref >60)
GFR calc non Af Amer: 15 mL/min — ABNORMAL LOW (ref >60)
Glucose, Bld: 158 mg/dL — ABNORMAL HIGH (ref 70–99)
Potassium: 4.4 mmol/L (ref 3.5–5.1)
Sodium: 147 mmol/L — ABNORMAL HIGH (ref 135–145)
Total Bilirubin: 0.6 mg/dL (ref 0.3–1.2)
Total Protein: 6 g/dL — ABNORMAL LOW (ref 6.5–8.1)

## 2020-07-13 LAB — GLUCOSE, CAPILLARY
Glucose-Capillary: 130 mg/dL — ABNORMAL HIGH (ref 70–99)
Glucose-Capillary: 141 mg/dL — ABNORMAL HIGH (ref 70–99)
Glucose-Capillary: 143 mg/dL — ABNORMAL HIGH (ref 70–99)
Glucose-Capillary: 146 mg/dL — ABNORMAL HIGH (ref 70–99)
Glucose-Capillary: 151 mg/dL — ABNORMAL HIGH (ref 70–99)
Glucose-Capillary: 151 mg/dL — ABNORMAL HIGH (ref 70–99)

## 2020-07-13 LAB — ASPERGILLUS ANTIGEN, BAL/SERUM: Aspergillus Ag, BAL/Serum: 0.03 Index (ref 0.00–0.49)

## 2020-07-13 LAB — FIBRINOGEN: Fibrinogen: 397 mg/dL (ref 210–475)

## 2020-07-13 LAB — AMMONIA: Ammonia: 41 umol/L — ABNORMAL HIGH (ref 9–35)

## 2020-07-13 LAB — T4, FREE: Free T4: 0.28 ng/dL — ABNORMAL LOW (ref 0.61–1.12)

## 2020-07-13 LAB — PHOSPHORUS: Phosphorus: 4.3 mg/dL (ref 2.5–4.6)

## 2020-07-13 LAB — URIC ACID: Uric Acid, Serum: 13.7 mg/dL — ABNORMAL HIGH (ref 2.5–7.1)

## 2020-07-13 LAB — MAGNESIUM: Magnesium: 2.6 mg/dL — ABNORMAL HIGH (ref 1.7–2.4)

## 2020-07-13 MED ORDER — ARGATROBAN 50 MG/50ML IV SOLN
0.5000 ug/kg/min | INTRAVENOUS | Status: DC
  Filled 2020-07-13: qty 50

## 2020-07-13 MED ORDER — CALCIUM GLUCONATE-NACL 1-0.675 GM/50ML-% IV SOLN
1.0000 g | Freq: Two times a day (BID) | INTRAVENOUS | Status: DC
  Administered 2020-07-13 – 2020-07-17 (×10): 1000 mg via INTRAVENOUS
  Filled 2020-07-13 (×12): qty 50

## 2020-07-13 MED ORDER — METHYLPREDNISOLONE SODIUM SUCC 40 MG IJ SOLR
40.0000 mg | Freq: Every day | INTRAMUSCULAR | Status: DC
  Administered 2020-07-13 – 2020-07-17 (×5): 40 mg via INTRAVENOUS
  Filled 2020-07-13 (×5): qty 1

## 2020-07-13 MED ORDER — ACETAMINOPHEN 650 MG RE SUPP
650.0000 mg | Freq: Four times a day (QID) | RECTAL | Status: DC | PRN
  Administered 2020-07-13: 650 mg via RECTAL
  Filled 2020-07-13: qty 1

## 2020-07-13 NOTE — Progress Notes (Signed)
[place], [place] 07/13/20  Subjective:   Hospital day # 3  Neuro: Patient remains encephalopathic.  Did not wake up or answer questions during assessment pulm: Requiring BiPAP Renal: 09/26 0701 - 09/27 0700 In: 2547.7 [I.V.:2447.7; IV Piggyback:100] Out: 8099 [Urine:5230] Lab Results  Component Value Date   CREATININE 3.12 (H) 07/13/2020   CREATININE 3.75 (H) 07/12/2020   CREATININE 4.49 (H) 07/11/2020     Objective:  Vital signs in last 24 hours:  Temp:  [98.1 F (36.7 C)-103 F (39.4 C)] 99.7 F (37.6 C) (09/27 1200) Pulse Rate:  [36-103] 94 (09/27 1100) Resp:  [31-42] 31 (09/27 1100) BP: (109-152)/(75-115) 121/75 (09/27 1100) SpO2:  [93 %-99 %] 99 % (09/27 1100) FiO2 (%):  [30 %] 30 % (09/27 0841) Weight:  [88.4 kg] 88.4 kg (09/27 0500)  Weight change: -2.2 kg Filed Weights   07/10/20 1400 07/12/20 0500 07/13/20 0500  Weight: 83.9 kg 90.6 kg 88.4 kg    Intake/Output:    Intake/Output Summary (Last 24 hours) at 07/13/2020 1230 Last data filed at 07/13/2020 1100 Gross per 24 hour  Intake 2680.09 ml  Output 4470 ml  Net -1789.91 ml     Physical Exam: General:  Critically ill-appearing, laying in the bed  HEENT  BiPAP mask in place  Pulm/lungs  coarse crackles bilaterally  CVS/Heart  irregular, tachycardic  Abdomen:   Soft, nondistended  Extremities:  Trace to 1+ dependent edema  Neurologic:  Encephalopathic, did not wake up or answer questions  Skin:  Cool, purple toes 1,2 on right, left dorsal chronic purple lesion          Basic Metabolic Panel:  Recent Labs  Lab 07/10/20 0504 07/10/20 0504 07/10/20 1418 07/10/20 1418 07/10/20 2259 07/10/20 2259 07/11/20 0445 07/12/20 0735 07/13/20 0316  NA 141   < > 141  --  141  --  142 143 147*  K 4.6   < > 5.2*  --  5.9*  --  4.7 4.8 4.4  CL 104   < > 106  --  102  --  103 103 107  CO2 20*   < > 20*  --  22  --  21* 24 27  GLUCOSE 80   < > 91  --  122*  --  128* 164*  158*  BUN 38*   < > 49*  --  59*  --  66* 89* 88*  CREATININE 4.05*   < > 4.22*  --  4.57*  --  4.49* 3.75* 3.12*  CALCIUM 7.2*   < > 7.0*   < > 7.4*   < > 7.2* 7.1* 6.6*  MG 1.3*  --   --   --   --   --  2.8* 2.8* 2.6*  PHOS 6.8*  --  6.5*  --   --   --   --  6.8* 4.3   < > = values in this interval not displayed.     CBC: Recent Labs  Lab 07/09/20 2327 07/10/20 0504 07/11/20 0445 07/12/20 0735 07/13/20 0316  WBC 32.9* 34.4* 26.9* 33.4* 17.2*  NEUTROABS 29.7* 30.3* 21.9* 31.5* 15.1*  HGB 12.2 11.2* 11.9* 12.0 12.1  HCT 38.2 34.6* 35.8* 35.6* 37.7  MCV 96.5 96.9 94.0 92.2 95.7  PLT 159 133* 67* 33* 24*     No results found for: HEPBSAG, HEPBSAB, HEPBIGM    Microbiology:  Recent Results (from the past 240 hour(s))  Urine culture  Status: Abnormal   Collection Time: 07/09/20 11:27 PM   Specimen: In/Out Cath Urine  Result Value Ref Range Status   Specimen Description   Final    IN/OUT CATH URINE Performed at [facility], [address] [place], [place] [address]    Special Requests   Final    NONE Performed at [facility], [place], [place] [address]    Culture >=100,000 COLONIES/mL KLEBSIELLA PNEUMONIAE (A)  Final   Report Status 07/12/2020 FINAL  Final   Organism ID, Bacteria KLEBSIELLA PNEUMONIAE (A)  Final      Susceptibility   Klebsiella pneumoniae - MIC*    AMPICILLIN RESISTANT Resistant     CEFAZOLIN <=4 SENSITIVE Sensitive     CEFTRIAXONE <=0.25 SENSITIVE Sensitive     CIPROFLOXACIN <=0.25 SENSITIVE Sensitive     GENTAMICIN <=1 SENSITIVE Sensitive     IMIPENEM <=0.25 SENSITIVE Sensitive     NITROFURANTOIN <=16 SENSITIVE Sensitive     TRIMETH/SULFA <=20 SENSITIVE Sensitive     AMPICILLIN/SULBACTAM <=2 SENSITIVE Sensitive     PIP/TAZO <=4 SENSITIVE Sensitive     * >=100,000 COLONIES/mL KLEBSIELLA PNEUMONIAE  Respiratory Panel by RT PCR (Flu A&B, Covid) - Nasopharyngeal Swab     Status: None   Collection Time:  07/09/20 11:28 PM   Specimen: Nasopharyngeal Swab  Result Value Ref Range Status   SARS Coronavirus 2 by RT PCR NEGATIVE NEGATIVE Final    Comment: (NOTE) SARS-CoV-2 target nucleic acids are NOT DETECTED.  The SARS-CoV-2 RNA is generally detectable in upper respiratoy specimens during the acute phase of infection. The lowest concentration of SARS-CoV-2 viral copies this assay can detect is 131 copies/mL. A negative result does not preclude SARS-Cov-2 infection and should not be used as the sole basis for treatment or other patient management decisions. A negative result may occur with  improper specimen collection/handling, submission of specimen other than nasopharyngeal swab, presence of viral mutation(s) within the areas targeted by this assay, and inadequate number of viral copies (<131 copies/mL). A negative result must be combined with clinical observations, patient history, and epidemiological information. The expected result is Negative.  Fact Sheet for Patients:  PinkCheek.be  Fact Sheet for Healthcare Providers:  GravelBags.it  This test is no t yet approved or cleared by the [place] FDA and  has been authorized for detection and/or diagnosis of SARS-CoV-2 by FDA under an Emergency Use Authorization (EUA). This EUA will remain  in effect (meaning this test can be used) for the duration of the COVID-19 declaration under Section 564(b)(1) of the Act, 21 U.S.C. section 360bbb-3(b)(1), unless the authorization is terminated or revoked sooner.     Influenza A by PCR NEGATIVE NEGATIVE Final   Influenza B by PCR NEGATIVE NEGATIVE Final    Comment: (NOTE) The Xpert Xpress SARS-CoV-2/FLU/RSV assay is intended as an aid in  the diagnosis of influenza from Nasopharyngeal swab specimens and  should not be used as a sole basis for treatment. Nasal washings and  aspirates are unacceptable for Xpert Xpress  SARS-CoV-2/FLU/RSV  testing.  Fact Sheet for Patients: PinkCheek.be  Fact Sheet for Healthcare Providers: GravelBags.it  This test is not yet approved or cleared by the [place] FDA and  has been authorized for detection and/or diagnosis of SARS-CoV-2 by  FDA under an Emergency Use Authorization (EUA). This EUA will remain  in effect (meaning this test can be used) for the duration of the  Covid-19 declaration under Section 564(b)(1) of the Act,  21  U.S.C. section 360bbb-3(b)(1), unless the authorization is  terminated or revoked. Performed at [facility], [address] [place], [place] [address]   Blood Culture (routine x 2)     Status: Abnormal   Collection Time: 07/09/20 11:36 PM   Specimen: BLOOD  Result Value Ref Range Status   Specimen Description   Final    BLOOD RIGHT ANTECUBITAL Performed at [facility], [address] [place], [place] [address]    Special Requests   Final    BOTTLES DRAWN AEROBIC AND ANAEROBIC Blood Culture adequate volume Performed at [facility], [address] [place], [place] [address]    Culture  Setup Time   Final    Organism ID to follow GRAM NEGATIVE RODS IN BOTH AEROBIC AND ANAEROBIC BOTTLES CRITICAL RESULT CALLED TO, READ BACK BY AND VERIFIED WITH: AMY THOMPSON AT 1220 07/10/20 SDR Performed at [facility], [address] [place], [place] [address]    Culture KLEBSIELLA PNEUMONIAE (A)  Final   Report Status 07/12/2020 FINAL  Final   Organism ID, Bacteria KLEBSIELLA PNEUMONIAE  Final      Susceptibility   Klebsiella pneumoniae - MIC*    AMPICILLIN RESISTANT Resistant     CEFAZOLIN <=4 SENSITIVE Sensitive     CEFEPIME <=0.12 SENSITIVE Sensitive     CEFTAZIDIME <=1 SENSITIVE Sensitive     CEFTRIAXONE <=0.25 SENSITIVE Sensitive     CIPROFLOXACIN <=0.25 SENSITIVE Sensitive     GENTAMICIN <=1 SENSITIVE Sensitive     IMIPENEM  <=0.25 SENSITIVE Sensitive     TRIMETH/SULFA <=20 SENSITIVE Sensitive     AMPICILLIN/SULBACTAM <=2 SENSITIVE Sensitive     PIP/TAZO <=4 SENSITIVE Sensitive     * KLEBSIELLA PNEUMONIAE  Blood Culture ID Panel (Reflexed)     Status: Abnormal   Collection Time: 07/09/20 11:36 PM  Result Value Ref Range Status   Enterococcus faecalis NOT DETECTED NOT DETECTED Final   Enterococcus Faecium NOT DETECTED NOT DETECTED Final   Listeria monocytogenes NOT DETECTED NOT DETECTED Final   Staphylococcus species NOT DETECTED NOT DETECTED Final   Staphylococcus aureus (BCID) NOT DETECTED NOT DETECTED Final   Staphylococcus epidermidis NOT DETECTED NOT DETECTED Final   Staphylococcus lugdunensis NOT DETECTED NOT DETECTED Final   Streptococcus species NOT DETECTED NOT DETECTED Final   Streptococcus agalactiae NOT DETECTED NOT DETECTED Final   Streptococcus pneumoniae NOT DETECTED NOT DETECTED Final   Streptococcus pyogenes NOT DETECTED NOT DETECTED Final   A.calcoaceticus-baumannii NOT DETECTED NOT DETECTED Final   Bacteroides fragilis NOT DETECTED NOT DETECTED Final   Enterobacterales DETECTED (A) NOT DETECTED Final    Comment: Enterobacterales represent a large order of gram negative bacteria, not a single organism. CRITICAL RESULT CALLED TO, READ BACK BY AND VERIFIED WITH:  AMY THOMPSON AT 0102 04/09/20 SDR    Enterobacter cloacae complex NOT DETECTED NOT DETECTED Final   Escherichia coli NOT DETECTED NOT DETECTED Final   Klebsiella aerogenes NOT DETECTED NOT DETECTED Final   Klebsiella oxytoca NOT DETECTED NOT DETECTED Final   Klebsiella pneumoniae DETECTED (A) NOT DETECTED Final    Comment: CRITICAL RESULT CALLED TO, READ BACK BY AND VERIFIED WITH:  AMY THOMPSON AT 1220 07/10/20 SDR    Proteus species NOT DETECTED NOT DETECTED Final   Salmonella species NOT DETECTED NOT DETECTED Final   Serratia marcescens NOT DETECTED NOT DETECTED Final   Haemophilus influenzae NOT DETECTED NOT DETECTED Final    Neisseria meningitidis NOT DETECTED NOT DETECTED Final   Pseudomonas  aeruginosa NOT DETECTED NOT DETECTED Final   Stenotrophomonas maltophilia NOT DETECTED NOT DETECTED Final   Candida albicans NOT DETECTED NOT DETECTED Final   Candida auris NOT DETECTED NOT DETECTED Final   Candida glabrata NOT DETECTED NOT DETECTED Final   Candida krusei NOT DETECTED NOT DETECTED Final   Candida parapsilosis NOT DETECTED NOT DETECTED Final   Candida tropicalis NOT DETECTED NOT DETECTED Final   Cryptococcus neoformans/gattii NOT DETECTED NOT DETECTED Final   CTX-M ESBL NOT DETECTED NOT DETECTED Final   Carbapenem resistance IMP NOT DETECTED NOT DETECTED Final   Carbapenem resistance KPC NOT DETECTED NOT DETECTED Final   Carbapenem resistance NDM NOT DETECTED NOT DETECTED Final   Carbapenem resist OXA 48 LIKE NOT DETECTED NOT DETECTED Final   Carbapenem resistance VIM NOT DETECTED NOT DETECTED Final    Comment: Performed at [facility], [address] [place], [place] [address]  Blood Culture (routine x 2)     Status: Abnormal   Collection Time: 07/09/20 11:37 PM   Specimen: BLOOD  Result Value Ref Range Status   Specimen Description   Final    BLOOD BLOOD RIGHT FOREARM Performed at [facility], [address] [place], [place] [address]    Special Requests   Final    BOTTLES DRAWN AEROBIC AND ANAEROBIC Blood Culture adequate volume Performed at [facility], [address] [place], [place] [address]    Culture  Setup Time   Final    GRAM NEGATIVE RODS IN BOTH AEROBIC AND ANAEROBIC BOTTLES CRITICAL RESULT CALLED TO, READ BACK BY AND VERIFIED WITH: AMY THOMPSON AT 1220 07/10/20 SDR Performed at [facility], [address] [place], [place] [address]    Culture (A)  Final    KLEBSIELLA PNEUMONIAE SUSCEPTIBILITIES PERFORMED ON PREVIOUS CULTURE WITHIN THE LAST 5 DAYS. Performed at [facility], [place] [address], [place], [place] [address]    Report  Status 07/12/2020 FINAL  Final  MRSA PCR Screening     Status: None   Collection Time: 07/10/20 12:54 PM   Specimen: Nasal Mucosa; Nasopharyngeal  Result Value Ref Range Status   MRSA by PCR NEGATIVE NEGATIVE Final    Comment:        The GeneXpert MRSA Assay (FDA approved for NASAL specimens only), is one component of a comprehensive MRSA colonization surveillance program. It is not intended to diagnose MRSA infection nor to guide or monitor treatment for MRSA infections. Performed at [facility], [address] [place], [place] [address]     Coagulation Studies: No results for input(s): LABPROT, INR in the last 72 hours.  Urinalysis: No results for input(s): COLORURINE, LABSPEC, PHURINE, GLUCOSEU, HGBUR, BILIRUBINUR, KETONESUR, PROTEINUR, UROBILINOGEN, NITRITE, LEUKOCYTESUR in the last 72 hours.  Invalid input(s): APPERANCEUR    Imaging: No results found.   Medications:   . sodium chloride 20 mL/hr at 07/11/20 1400  . sodium chloride 75 mL/hr at 07/13/20 1100  . azithromycin Stopped (07/13/20 0917)  . cefTRIAXone (ROCEPHIN)  IV 2 g (07/12/20 1801)  . dexmedetomidine (PRECEDEX) IV infusion 0.4 mcg/kg/hr (07/13/20 1100)  . phenylephrine (NEO-SYNEPHRINE) Adult infusion Stopped (07/11/20 0130)   . arformoterol  15 mcg Nebulization BID  . budesonide (PULMICORT) nebulizer solution  0.25 mg Nebulization BID  . chlorhexidine  15 mL Mouth Rinse BID  . Chlorhexidine Gluconate Cloth  6 each Topical Q0600  . mouth rinse  15 mL Mouth Rinse q12n4p  . pantoprazole (PROTONIX) IV  40 mg Intravenous QHS  .  thiamine injection  100 mg Intravenous Daily  . Treprostinil  18 mcg Inhalation QID   acetaminophen, docusate sodium, ipratropium-albuterol, polyethylene glycol  Assessment/ Plan:  64 y.o. female with  sarcoidosis, interstitial lung disease, obstructive sleep apnea, depression, GERD, hyperlipidemia, hypothyroidism   admitted on 07/09/2020 for Elevated LFTs  [R79.89] Sepsis ([place]) [A41.9] Sepsis, due to unspecified organism, unspecified whether acute organ dysfunction present [facility]) [A41.9]  # AKI Baseline Cr 1.0 in May 2021 Admi Cr 4.16, peaked at 4.57 Patient is nonoliguric and serum creatinine trends are improving now We will continue to monitor closely.  No acute indication for dialysis  #Sepsis from urinary source Klebsiella pneumonia and blood in urine Patient has significant history of renal stones.  History of left ureteroscopy May 2021 Will obtain renal ultrasound-late entry-unremarkable appearance of kidneys.  No hydronephrosis   #History of pulmonary sarcoidosis, pulmonary hypertension Acute on chronic respiratory failure, community-acquired pneumonia  requires home oxygen and CPAP Currently requiring noninvasive positive pressure ventilation. Management as per pulmonary/ICU team   LOS: [place] 9/27/202112:30 PM  [place], [place]  Note: This note was prepared with Dragon dictation. Any transcription errors are unintentional

## 2020-07-13 NOTE — Progress Notes (Signed)
Shift summary:  - Patient requiring continuous BiPAP.  - Weaning Precedex as tolerated.  - Febrile this AM, MD aware.

## 2020-07-13 NOTE — Consult Note (Signed)
NAME: Stacie [name]  DOB: 27-Apr-1956  MRN: [id number]  Date/Time: 07/13/2020 3:29 PM  REQUESTING PROVIDER: Deniece Portela Subjective:  REASON FOR CONSULT: bacteremia and UTI ?chart reviewed, spoke to her husband and daughter. Stacie [name] is a 64 y.o. with a history of Pulmonary sarcoid, PHT, Renal calculi Was admitted on 9/23 with confusion, lethargy , fatigue and worsening sob.she had received flu vaccine on 07/08/20 Vitals were temp of 100.6, BP 115/61, HR 115. CT chest showed Chronic changes of pulmonary sarcoidosis with biapical predominant pulmonary fibrosis. Very mild superimposed active inflammatory infiltrate. WBC was 32.9, HB 12, PLT 159 AST 113 and ALT 55, TB 1.5, Cr 4.16, lactate 5.8 She was admitted to ICU. Seen by intensivist and nephrologist Initial diagnosis was acute hypoxic resp failure with CAP. Was started on ceftriaxone and azithromycin and later blood culture came positive for kleb. I am seeing her for the same  Medical history -- presented to urologist with dysuria, incontinence -02/20/20- left ureteral calculuscystoscopy with left ureteroscopy  With lithotripsy by dr.Brandon with stent placement.Stone composed of 60% calcium oxalate monohydrate and 40% calcium oxalate dihydrate.- given keflex 04/07/20 X7 D for proteus and then ciprofloxacin in July  Pt has had multiple urine cultures onepositive for proteus 6/22/1 ( teated with keflex) and then on Aug 27th  she had a urine culture positive for kleb- not treated. She also was started on Mirabegron for OAB by urologist in July 20201 and also noted to have urethral carunle and was asked to take topical estrogen cream. On 9/20 she had seen Dr.Fowler for left mid foot pain which was duse to car door smashing on her foot- There was no fracture of the foot on xray and Dr. Alvera Singh diagnosis was compression injury  Pt takes methotrexate weekly for Sarcoid along with prednisone  As per her daughter she has received the covid  booster dose. At baseline she is fairly active inside her house, she had [place] with her [place] to stay away from any COVID exposure.    Past Medical History:  Diagnosis Date  . Arrhythmia    patient unaware if this is current  . Asthma   . GERD (gastroesophageal reflux disease)   . Heart murmur   . History of kidney stones   . HOH (hard of hearing)    wear aids  . Hypothyroidism   . IBS (irritable bowel syndrome)   . Pulmonary hypertension ([place])   . Sarcoid   . Sarcoidosis   . Seasonal allergies   . Sleep apnea CPAP with O2  . Wears hearing aid in both ears    Renal calculi Hypercalcemia Pulmonary Sarcoid/Pulmonary HTN/Home oxygen/CPAP Hyperlipidemia CKD   Past Surgical History:  Procedure Laterality Date  . CARDIAC CATHETERIZATION  10/18/2018   [facility]  . CATARACT EXTRACTION W/PHACO Left 07/31/2019   Procedure: CATARACT EXTRACTION PHACO AND INTRAOCULAR LENS PLACEMENT (IOC) LEFT 00:51.1  17.9%  9.15;  Surgeon: Leandrew Koyanagi, MD;  Location: [facility];  Service: Ophthalmology;  Laterality: Left;  keep this patient second  . COLON SURGERY     "colon was fused to bladder - operated on both"  . COLONOSCOPY  09/18/2007   diverticuli, no polyps  . COLONOSCOPY  05/26/2010   diverticuli, no polyps  . CYSTOSCOPY/URETEROSCOPY/HOLMIUM LASER/STENT PLACEMENT Left 02/20/2020   Procedure: CYSTOSCOPY/URETEROSCOPY/LITHOTRIPSY /STENT PLACEMENT;  Surgeon: Hollice Espy, MD;  Location: [facility];  Service: Urology;  Laterality: Left;  . PARS PLANA VITRECTOMY Right 05/20/2015   Procedure:  PARS PLANA VITRECTOMY WITH 25 GAUGE, laser;  Surgeon: Milus Height, MD;  Location: [facility];  Service: Ophthalmology;  Laterality: Right;  . PARTIAL HYSTERECTOMY  1990  . TUBAL LIGATION      Social History   Socioeconomic History  . Marital status: Married    Spouse name: Not on file  . Number of children: Not on file  . Years of education: Not on file  .  Highest education level: Not on file  Occupational History  . Occupation: Homemaker  Tobacco Use  . Smoking status: Never Smoker  . Smokeless tobacco: Never Used  Vaping Use  . Vaping Use: Never used  Substance and Sexual Activity  . Alcohol use: No  . Drug use: No  . Sexual activity: Not on file  Other Topics Concern  . Not on file  Social History Narrative  . Not on file   Social Determinants of Health   Financial Resource Strain:   . Difficulty of Paying Living Expenses: Not on file  Food Insecurity:   . Worried About Charity fundraiser in the Last Year: Not on file  . Ran Out of Food in the Last Year: Not on file  Transportation Needs:   . Lack of Transportation (Medical): Not on file  . Lack of Transportation (Non-Medical): Not on file  Physical Activity:   . Days of Exercise per Week: Not on file  . Minutes of Exercise per Session: Not on file  Stress:   . Feeling of Stress : Not on file  Social Connections:   . Frequency of Communication with Friends and Family: Not on file  . Frequency of Social Gatherings with Friends and Family: Not on file  . Attends Religious Services: Not on file  . Active Member of Clubs or Organizations: Not on file  . Attends Archivist Meetings: Not on file  . Marital Status: Not on file  Intimate Partner Violence:   . Fear of Current or Ex-Partner: Not on file  . Emotionally Abused: Not on file  . Physically Abused: Not on file  . Sexually Abused: Not on file    Family History  Problem Relation Age of Onset  . Allergies Father   . Asthma Father   . Colon cancer Father   . Allergies Brother   . Asthma Brother   . Breast cancer Maternal Grandmother    Allergies  Allergen Reactions  . Corn-Containing Products Diarrhea    headaches  . Nitrofurantoin Nausea Only  . Tramadol     Nausea and vomiting  . Sulfa Antibiotics Rash    As an infant   Home meds denusomab symbicort I haler Fish oil Truspot/alphagan  /Xalantan Methotrexate Mirabegron Prednisone Amlodipine  ? Current Facility-Administered Medications  Medication Dose Route Frequency Provider Last Rate Last Admin  . 0.9 %  sodium chloride infusion  250 mL Intravenous Continuous Darel Hong D, NP 20 mL/hr at 07/11/20 1400 Rate Verify at 07/11/20 1400  . 0.9 %  sodium chloride infusion   Intravenous Continuous Liana Gerold, MD 75 mL/hr at 07/13/20 1512 New Bag at 07/13/20 1512  . acetaminophen (TYLENOL) suppository 650 mg  650 mg Rectal Q6H PRN Ottie Glazier, MD   650 mg at 07/13/20 0839  . arformoterol (BROVANA) nebulizer solution 15 mcg  15 mcg Nebulization BID Tyler Pita, MD   15 mcg at 07/13/20 0841  . azithromycin (ZITHROMAX) 500 mg in sodium chloride 0.9 % 250 mL IVPB  500 mg Intravenous  Q24H Tyler Pita, MD   Stopped at 07/13/20 [phone]  . budesonide (PULMICORT) nebulizer solution 0.25 mg  0.25 mg Nebulization BID Tyler Pita, MD   0.25 mg at 07/13/20 0841  . calcium gluconate 1 g/ 50 mL sodium chloride IVPB  1 g Intravenous BID Murlean Iba, MD 50 mL/hr at 07/13/20 1515 1,000 mg at 07/13/20 1515  . cefTRIAXone (ROCEPHIN) 2 g in sodium chloride 0.9 % 100 mL IVPB  2 g Intravenous Q24H Tyler Pita, MD 200 mL/hr at 07/12/20 1801 2 g at 07/12/20 1801  . chlorhexidine (PERIDEX) 0.12 % solution 15 mL  15 mL Mouth Rinse BID Tyler Pita, MD   15 mL at 07/13/20 1007  . Chlorhexidine Gluconate Cloth 2 % PADS 6 each  6 each Topical Q0600 Tyler Pita, MD   6 each at 07/12/20 0100  . dexmedetomidine (PRECEDEX) 400 MCG/100ML (4 mcg/mL) infusion  0.4-1.6 mcg/kg/hr Intravenous Titrated Awilda Bill, NP 8.39 mL/hr at 07/13/20 1500 0.4 mcg/kg/hr at 07/13/20 1500  . docusate sodium (COLACE) capsule 100 mg  100 mg Oral BID PRN Darel Hong D, NP      . ipratropium-albuterol (DUONEB) 0.5-2.5 (3) MG/3ML nebulizer solution 3 mL  3 mL Nebulization Q4H PRN Darel Hong D, NP      . MEDLINE mouth  rinse  15 mL Mouth Rinse q12n4p Tyler Pita, MD   15 mL at 07/12/20 1600  . methylPREDNISolone sodium succinate (SOLU-MEDROL) 40 mg/mL injection 40 mg  40 mg Intravenous Daily Ottie Glazier, MD   40 mg at 07/13/20 1512  . pantoprazole (PROTONIX) injection 40 mg  40 mg Intravenous QHS Tyler Pita, MD   40 mg at 07/13/20 0030  . phenylephrine (NEO-SYNEPHRINE) 10 mg in sodium chloride 0.9 % 250 mL (0.04 mg/mL) infusion  25-200 mcg/min Intravenous Titrated Bradly Bienenstock, NP   Stopped at 07/11/20 0130  . polyethylene glycol (MIRALAX / GLYCOLAX) packet 17 g  17 g Oral Daily PRN Darel Hong D, NP      . thiamine (B-1) injection 100 mg  100 mg Intravenous Daily Tyler Pita, MD   100 mg at 07/13/20 1025  . Treprostinil (TYVASO) inhalation solution 18 mcg  18 mcg Inhalation QID Dallie Piles, RPH         Abtx:  Anti-infectives (From admission, onward)   Start     Dose/Rate Route Frequency Ordered Stop   07/12/20 0200  vancomycin (VANCOCIN) IVPB 1000 mg/200 mL premix  Status:  Discontinued        1,000 mg 200 mL/hr over 60 Minutes Intravenous Every 48 hours 07/10/20 0133 07/10/20 1449   07/11/20 0800  azithromycin (ZITHROMAX) 500 mg in sodium chloride 0.9 % 250 mL IVPB        500 mg 250 mL/hr over 60 Minutes Intravenous Every 24 hours 07/10/20 1230     07/10/20 2200  ceFEPIme (MAXIPIME) 2 g in sodium chloride 0.9 % 100 mL IVPB  Status:  Discontinued        2 g 200 mL/hr over 30 Minutes Intravenous Every 24 hours 07/10/20 0126 07/10/20 1449   07/10/20 1800  cefTRIAXone (ROCEPHIN) 2 g in sodium chloride 0.9 % 100 mL IVPB        2 g 200 mL/hr over 30 Minutes Intravenous Every 24 hours 07/10/20 1449     07/10/20 0145  vancomycin (VANCOREADY) IVPB 2000 mg/400 mL        2,000 mg 200 mL/hr over 120  Minutes Intravenous  Once 07/10/20 0130 07/10/20 0352   07/10/20 0000  cefTAZidime (FORTAZ) 1 g in sodium chloride 0.9 % 100 mL IVPB        1 g 200 mL/hr over 30 Minutes  Intravenous  Once 07/09/20 2357 07/10/20 0147   07/09/20 2345  cefTAZidime (FORTAZ) injection 1 g  Status:  Discontinued        1 g Intramuscular  Once 07/09/20 2334 07/09/20 2356   07/09/20 2345  azithromycin (ZITHROMAX) 500 mg in sodium chloride 0.9 % 250 mL IVPB        500 mg 250 mL/hr over 60 Minutes Intravenous  Once 07/09/20 2334 07/10/20 0105   07/09/20 2330  levofloxacin (LEVAQUIN) IVPB 750 mg  Status:  Discontinued        750 mg 100 mL/hr over 90 Minutes Intravenous  Once 07/09/20 2327 07/09/20 2334      REVIEW OF SYSTEMS:  NA Objective:  VITALS:  BP 119/81   Pulse 80   Temp 99.7 F (37.6 C) (Axillary)   Resp (!) 23   Ht _0  (1.702 m)   Wt 88.4 kg   SpO2 98%   BMI 30.52 kg/m  PHYSICAL EXAM:  General: on bipap, lethargic, somnolent Non verbal  Head: Normocephalic, without obvious abnormality, atraumatic. Eyes: did not examine ENT did not examine Neck: Supple, Back: did not examine Lungs:b/l air entry. Heart: Tachycardia Abdomen: Soft,  Extremities:ecchymosis left foot     Skin:  Bruising over abdominal wall and arms Lymph: Cervical, supraclavicular normal. Neurologic: Grossly non-focal Pertinent Labs Lab Results CBC    Component Value Date/Time   WBC 17.2 (H) 07/13/2020 0316   RBC 3.94 07/13/2020 0316   HGB 12.1 07/13/2020 0316   HGB 13.4 05/27/2019 0947   HCT 37.7 07/13/2020 0316   HCT 39.2 05/27/2019 0947   PLT 24 (LL) 07/13/2020 0316   PLT 299 05/27/2019 0947   MCV 95.7 07/13/2020 0316   MCV 91 05/27/2019 0947   MCV 90 12/28/2012 0454   MCH 30.7 07/13/2020 0316   MCHC 32.1 07/13/2020 0316   RDW 14.0 07/13/2020 0316   RDW 13.1 05/27/2019 0947   RDW 14.8 (H) 12/28/2012 0454   LYMPHSABS 0.7 07/13/2020 0316   LYMPHSABS 1.6 05/27/2019 0947   LYMPHSABS 0.7 (L) 12/28/2012 0454   MONOABS 1.0 07/13/2020 0316   MONOABS 0.6 12/28/2012 0454   EOSABS 0.0 07/13/2020 0316   EOSABS 0.4 05/27/2019 0947   EOSABS 0.8 (H) 12/28/2012 0454   BASOSABS  0.1 07/13/2020 0316   BASOSABS 0.1 05/27/2019 0947   BASOSABS 0.0 12/28/2012 0454    CMP Latest Ref Rng & Units 07/13/2020 07/12/2020 07/11/2020  Glucose 70 - 99 mg/dL 158(H) 164(H) 128(H)  BUN 8 - 23 mg/dL 88(H) 89(H) 66(H)  Creatinine 0.44 - 1.00 mg/dL 3.12(H) 3.75(H) 4.49(H)  Sodium 135 - 145 mmol/L 147(H) 143 142  Potassium 3.5 - 5.1 mmol/L 4.4 4.8 4.7  Chloride 98 - 111 mmol/L 107 103 103  CO2 22 - 32 mmol/L 27 24 21(L)  Calcium 8.9 - 10.3 mg/dL 6.6(L) 7.1(L) 7.2(L)  Total Protein 6.5 - 8.1 g/dL 6.0(L) 6.1(L) 5.9(L)  Total Bilirubin 0.3 - 1.2 mg/dL 0.6 0.7 0.8  Alkaline Phos 38 - 126 U/L 93 102 89  AST 15 - 41 U/L 145(H) 190(H) 137(H)  ALT 0 - 44 U/L 207(H) 211(H) 97(H)      Microbiology: Recent Results (from the past 240 hour(s))  Urine culture     Status: Abnormal  Collection Time: 07/09/20 11:27 PM   Specimen: In/Out Cath Urine  Result Value Ref Range Status   Specimen Description   Final    IN/OUT CATH URINE Performed at [facility], [address] [place], [place] [address]    Special Requests   Final    NONE Performed at [facility], [place], [place] [address]    Culture >=100,000 COLONIES/mL KLEBSIELLA PNEUMONIAE (A)  Final   Report Status 07/12/2020 FINAL  Final   Organism ID, Bacteria KLEBSIELLA PNEUMONIAE (A)  Final      Susceptibility   Klebsiella pneumoniae - MIC*    AMPICILLIN RESISTANT Resistant     CEFAZOLIN <=4 SENSITIVE Sensitive     CEFTRIAXONE <=0.25 SENSITIVE Sensitive     CIPROFLOXACIN <=0.25 SENSITIVE Sensitive     GENTAMICIN <=1 SENSITIVE Sensitive     IMIPENEM <=0.25 SENSITIVE Sensitive     NITROFURANTOIN <=16 SENSITIVE Sensitive     TRIMETH/SULFA <=20 SENSITIVE Sensitive     AMPICILLIN/SULBACTAM <=2 SENSITIVE Sensitive     PIP/TAZO <=4 SENSITIVE Sensitive     * >=100,000 COLONIES/mL KLEBSIELLA PNEUMONIAE  Respiratory Panel by RT PCR (Flu A&B, Covid) - Nasopharyngeal Swab     Status: None    Collection Time: 07/09/20 11:28 PM   Specimen: Nasopharyngeal Swab  Result Value Ref Range Status   SARS Coronavirus 2 by RT PCR NEGATIVE NEGATIVE Final    Comment: (NOTE) SARS-CoV-2 target nucleic acids are NOT DETECTED.  The SARS-CoV-2 RNA is generally detectable in upper respiratoy specimens during the acute phase of infection. The lowest concentration of SARS-CoV-2 viral copies this assay can detect is 131 copies/mL. A negative result does not preclude SARS-Cov-2 infection and should not be used as the sole basis for treatment or other patient management decisions. A negative result may occur with  improper specimen collection/handling, submission of specimen other than nasopharyngeal swab, presence of viral mutation(s) within the areas targeted by this assay, and inadequate number of viral copies (<131 copies/mL). A negative result must be combined with clinical observations, patient history, and epidemiological information. The expected result is Negative.  Fact Sheet for Patients:  PinkCheek.be  Fact Sheet for Healthcare Providers:  GravelBags.it  This test is no t yet approved or cleared by the [place] FDA and  has been authorized for detection and/or diagnosis of SARS-CoV-2 by FDA under an Emergency Use Authorization (EUA). This EUA will remain  in effect (meaning this test can be used) for the duration of the COVID-19 declaration under Section 564(b)(1) of the Act, 21 U.S.C. section 360bbb-3(b)(1), unless the authorization is terminated or revoked sooner.     Influenza A by PCR NEGATIVE NEGATIVE Final   Influenza B by PCR NEGATIVE NEGATIVE Final    Comment: (NOTE) The Xpert Xpress SARS-CoV-2/FLU/RSV assay is intended as an aid in  the diagnosis of influenza from Nasopharyngeal swab specimens and  should not be used as a sole basis for treatment. Nasal washings and  aspirates are unacceptable for Xpert  Xpress SARS-CoV-2/FLU/RSV  testing.  Fact Sheet for Patients: PinkCheek.be  Fact Sheet for Healthcare Providers: GravelBags.it  This test is not yet approved or cleared by the [place] FDA and  has been authorized for detection and/or diagnosis of SARS-CoV-2 by  FDA under an Emergency Use Authorization (EUA). This EUA will remain  in effect (meaning this test can be used) for the duration of the  Covid-19 declaration under Section 564(b)(1) of the Act, 21  U.S.C. section  360bbb-3(b)(1), unless the authorization is  terminated or revoked. Performed at [facility], [address] [place], [place] [address]   Blood Culture (routine x 2)     Status: Abnormal   Collection Time: 07/09/20 11:36 PM   Specimen: BLOOD  Result Value Ref Range Status   Specimen Description   Final    BLOOD RIGHT ANTECUBITAL Performed at [facility], [address] [place], [place] [address]    Special Requests   Final    BOTTLES DRAWN AEROBIC AND ANAEROBIC Blood Culture adequate volume Performed at [facility], [address] [place], [place] [address]    Culture  Setup Time   Final    Organism ID to follow GRAM NEGATIVE RODS IN BOTH AEROBIC AND ANAEROBIC BOTTLES CRITICAL RESULT CALLED TO, READ BACK BY AND VERIFIED WITH: AMY THOMPSON AT 1220 07/10/20 SDR Performed at [facility], [address] [place], [place] [address]    Culture KLEBSIELLA PNEUMONIAE (A)  Final   Report Status 07/12/2020 FINAL  Final   Organism ID, Bacteria KLEBSIELLA PNEUMONIAE  Final      Susceptibility   Klebsiella pneumoniae - MIC*    AMPICILLIN RESISTANT Resistant     CEFAZOLIN <=4 SENSITIVE Sensitive     CEFEPIME <=0.12 SENSITIVE Sensitive     CEFTAZIDIME <=1 SENSITIVE Sensitive     CEFTRIAXONE <=0.25 SENSITIVE Sensitive     CIPROFLOXACIN <=0.25 SENSITIVE Sensitive     GENTAMICIN <=1 SENSITIVE Sensitive      IMIPENEM <=0.25 SENSITIVE Sensitive     TRIMETH/SULFA <=20 SENSITIVE Sensitive     AMPICILLIN/SULBACTAM <=2 SENSITIVE Sensitive     PIP/TAZO <=4 SENSITIVE Sensitive     * KLEBSIELLA PNEUMONIAE  Blood Culture ID Panel (Reflexed)     Status: Abnormal   Collection Time: 07/09/20 11:36 PM  Result Value Ref Range Status   Enterococcus faecalis NOT DETECTED NOT DETECTED Final   Enterococcus Faecium NOT DETECTED NOT DETECTED Final   Listeria monocytogenes NOT DETECTED NOT DETECTED Final   Staphylococcus species NOT DETECTED NOT DETECTED Final   Staphylococcus aureus (BCID) NOT DETECTED NOT DETECTED Final   Staphylococcus epidermidis NOT DETECTED NOT DETECTED Final   Staphylococcus lugdunensis NOT DETECTED NOT DETECTED Final   Streptococcus species NOT DETECTED NOT DETECTED Final   Streptococcus agalactiae NOT DETECTED NOT DETECTED Final   Streptococcus pneumoniae NOT DETECTED NOT DETECTED Final   Streptococcus pyogenes NOT DETECTED NOT DETECTED Final   A.calcoaceticus-baumannii NOT DETECTED NOT DETECTED Final   Bacteroides fragilis NOT DETECTED NOT DETECTED Final   Enterobacterales DETECTED (A) NOT DETECTED Final    Comment: Enterobacterales represent a large order of gram negative bacteria, not a single organism. CRITICAL RESULT CALLED TO, READ BACK BY AND VERIFIED WITH:  AMY THOMPSON AT 2585 04/09/20 SDR    Enterobacter cloacae complex NOT DETECTED NOT DETECTED Final   Escherichia coli NOT DETECTED NOT DETECTED Final   Klebsiella aerogenes NOT DETECTED NOT DETECTED Final   Klebsiella oxytoca NOT DETECTED NOT DETECTED Final   Klebsiella pneumoniae DETECTED (A) NOT DETECTED Final    Comment: CRITICAL RESULT CALLED TO, READ BACK BY AND VERIFIED WITH:  AMY THOMPSON AT 1220 07/10/20 SDR    Proteus species NOT DETECTED NOT DETECTED Final   Salmonella species NOT DETECTED NOT DETECTED Final   Serratia marcescens NOT DETECTED NOT DETECTED Final   Haemophilus influenzae NOT DETECTED NOT DETECTED  Final   Neisseria meningitidis NOT DETECTED NOT DETECTED Final   Pseudomonas aeruginosa NOT DETECTED NOT  DETECTED Final   Stenotrophomonas maltophilia NOT DETECTED NOT DETECTED Final   Candida albicans NOT DETECTED NOT DETECTED Final   Candida auris NOT DETECTED NOT DETECTED Final   Candida glabrata NOT DETECTED NOT DETECTED Final   Candida krusei NOT DETECTED NOT DETECTED Final   Candida parapsilosis NOT DETECTED NOT DETECTED Final   Candida tropicalis NOT DETECTED NOT DETECTED Final   Cryptococcus neoformans/gattii NOT DETECTED NOT DETECTED Final   CTX-M ESBL NOT DETECTED NOT DETECTED Final   Carbapenem resistance IMP NOT DETECTED NOT DETECTED Final   Carbapenem resistance KPC NOT DETECTED NOT DETECTED Final   Carbapenem resistance NDM NOT DETECTED NOT DETECTED Final   Carbapenem resist OXA 48 LIKE NOT DETECTED NOT DETECTED Final   Carbapenem resistance VIM NOT DETECTED NOT DETECTED Final    Comment: Performed at [facility], [address] [place], [place] [address]  Blood Culture (routine x 2)     Status: Abnormal   Collection Time: 07/09/20 11:37 PM   Specimen: BLOOD  Result Value Ref Range Status   Specimen Description   Final    BLOOD BLOOD RIGHT FOREARM Performed at [facility], [address], [place], [place] [address]    Special Requests   Final    BOTTLES DRAWN AEROBIC AND ANAEROBIC Blood Culture adequate volume Performed at [facility], [address] [place], [place] [address]    Culture  Setup Time   Final    GRAM NEGATIVE RODS IN BOTH AEROBIC AND ANAEROBIC BOTTLES CRITICAL RESULT CALLED TO, READ BACK BY AND VERIFIED WITH: AMY THOMPSON AT 1220 07/10/20 SDR Performed at [facility], [address] [place], [place] [address]    Culture (A)  Final    KLEBSIELLA PNEUMONIAE SUSCEPTIBILITIES PERFORMED ON PREVIOUS CULTURE WITHIN THE LAST 5 DAYS. Performed at [facility], [place] [place], [place] [address]     Report Status 07/12/2020 FINAL  Final  Aspergillus Ag, BAL/Serum     Status: None   Collection Time: 07/10/20  5:04 AM   Specimen: Blood  Result Value Ref Range Status   Aspergillus Ag, BAL/Serum 0.03 0.00 - 0.49 Index Final    Comment: (NOTE) Performed At: [facility] [place], [place] [id number] Rush Farmer MD QP:[id number]   MRSA PCR Screening     Status: None   Collection Time: 07/10/20 12:54 PM   Specimen: Nasal Mucosa; Nasopharyngeal  Result Value Ref Range Status   MRSA by PCR NEGATIVE NEGATIVE Final    Comment:        The GeneXpert MRSA Assay (FDA approved for NASAL specimens only), is one component of a comprehensive MRSA colonization surveillance program. It is not intended to diagnose MRSA infection nor to guide or monitor treatment for MRSA infections. Performed at [facility], [address] [place], [place] [address]     IMAGING RESULTS:  I have personally reviewed the films ?Chronic changes of pulmonary sarcoidosis with biapical predominant pulmonary fibrosis. Very mild superimposed active inflammatory infiltrate. Impression/Recommendation ? ?Encephalopathy due to infection/AKI  Klebsiella bacteremia with Klebsiella UT- pretty pan sensitive kleb (except ampicillin) Continue ceftriaxone Recommend Ct abd/pelvis ( without contrast) to look for renal/ureteric stone  AKI- combination of ATN,dehydration , urate high no hydronephrosis-renal following  Severe thrombocytopenia- r/o HIT  Abnormal lfts- could be methotrexate, sepsis-improving  Left foot ecchymosis- recent trauma from car door  Pulmonary sarcoidosis - pn weekly methotrexate and prednisone CPAP and home oxygen  H/o left ureteric stone  s/p lithotripsy and stent in May 2021 - stent removed after that H/o Over active bladder, recurrent UTI   ___________________________________________________ Discussed with requesting provider and family

## 2020-07-13 NOTE — Progress Notes (Addendum)
PHARMACY CONSULT NOTE - FOLLOW UP  Pharmacy Consult for Electrolyte Monitoring and Replacement   Recent Labs: Potassium (mmol/L)  Date Value  07/13/2020 4.4  12/28/2012 3.4 (L)   Magnesium (mg/dL)  Date Value  07/13/2020 2.6 (H)   Calcium (mg/dL)  Date Value  07/13/2020 6.6 (L)   Calcium, Total (mg/dL)  Date Value  12/28/2012 8.0 (L)   Albumin (g/dL)  Date Value  07/13/2020 2.4 (L)  05/27/2019 4.1  12/26/2012 3.0 (L)   Phosphorus (mg/dL)  Date Value  07/13/2020 4.3   Sodium (mmol/L)  Date Value  07/13/2020 147 (H)  05/27/2019 144  12/28/2012 142  Corrected Calcium: 7.7   Assessment: Pharmacy consulted to replace electrolytes in this 64 year old female admitted with sepsis of unknown source.. Pt with PMH of Stage 3 CKD and baseline Scr of 1.0 in May 2021. Nephrology is following.  9/27 Electrolytes WNL, with sodium slightly elevated at 147, on NS at 75 mL/hr ordered to stop tonight at 2359 . Scr elevated but trending down (4.49>>3.75>>3.12) with good UOP yesterday.   Goal of Therapy:  Electrolytes WNL   Plan:  -Pt placed on Calcium Gluconate 1g twice daily. -No additional replacement needed at this time -Will recheck electrolytes with AM labs.   Jacobo Forest PharmD Candidate 2022 07/13/2020 12:46 PM

## 2020-07-13 NOTE — Progress Notes (Signed)
*  PRELIMINARY RESULTS* Echocardiogram 2D Echocardiogram has been performed.  Stacie [name] 07/13/2020, 12:41 PM

## 2020-07-13 NOTE — Progress Notes (Signed)
Name: Stacie [name] MRN: [id number] DOB: [birth year]    ADMISSION DATE:  07/09/2020 INITIAL CONSULTATION DATE:  07/10/2020  REFERRING MD :  Dr. Owens [name]  CHIEF COMPLAINT:  Altered Mental Status & Shortness of Breath  BRIEF PATIENT DESCRIPTION:  64 y.o. Female admitted with Sepsis and Acute on Chronic Hypoxic Respiratory Failure in the setting of Community Acquired Pneumonia and Acute Exacerbation of Pulmonary Sarcoidosis requiring BiPAP. Pt also with UTI and AKI, Nephrology has been consulted.  SIGNIFICANT EVENTS  9/23: Presented to ED, placed on BiPAP 9/24: PCCM asked to admit 9/25: Klebsiella pneumoniae bacteremia due to UTI, associated encephalopathy 9/26: Persistent encephalopathy 9/27- Patient remains encephalopathic on BIPAP, she is oozing blood from peripheral veins and both LE toes are with ischemic changes which is new per husband.  I have called and updated Dr [facility] [name] regarding patients condition.  Concern is possible DIC onset since INR is incrementing up and PIV oozing on exam.  Patient is not on vasopressor support at this time. Restarted solumedrol due to chronic steroid therapy at home. Discussed case with Dr [name] nephrology and Dr [facility]acie [name].    STUDIES:  9/23: CXR>>Cardiac shadow is stable. Chronic scarring is noted in the apices bilaterally with hilar retraction consistent with the given clinical history of prior sarcoidosis. Patchy increased airspace opacities are noted in the lower lungs which may represent some acute on chronic infiltrate. Changes of prior vertebral augmentation are seen. No acute bony abnormality is noted. 9/24: CT Head>>Chronic white matter ischemic changes without acute abnormality 9/24: CT Chest w/o Contrast>>Chronic changes of pulmonary sarcoidosis with biapical predominant pulmonary fibrosis. Very mild superimposed active inflammatory infiltrate. Morphologic changes compatible with pulmonary arterial hypertension.  CULTURES: SARS-CoV-2 PCR  9/23>> negative Influenza A&B 9/23>>negative Respiratory Viral Panel 9/24>> negative Blood culture 9/23>> Klebsiella pneumoniae all 4 bottles Sputum 9/24>> unable to obtain, nonproductive Urine 9/23>> Klebsiella pneumonia Strep pneumo urinary antigen 9/24>> not obtained Legionella urinary antigen 9/24>> not obtained Aspergillus 9/24>> pending Fungitel 9/24>> pending C. Diff 9/24>> no sample  ANTIBIOTICS: Azithromycin x1 dose 9/23 Ceftazidime x1 dose 9/23 Cefepime 9/24>> 9/24 Vancomycin 9/24>> 9/24 Ceftriaxone 9/24>>  PATIENT PROFILE:   Stacie [name] is a 64 y.o. Female with a past medical history significant for pulmonary sarcoidosis (on 2L [place] at baseline), pulmonary hypertension, OSA, asthma, hypertension, hypothyroidism, IBS, and chronic kidney disease who presents [name] on 07[facility]20 due to altered mental status and shortness of breath.  Pt is currently confused and unable to contribute to history, and no family present, therefore history is obtained from ED and nursing notes.  PCCM asked to admit patient.  Patient has Klebsiella pneumonia bacteremia and severe sepsis.  Encephalopathy secondary to the same.   Allergies  Allergen Reactions  . Corn-Containing Products Diarrhea    headaches  . Nitrofurantoin Nausea Only  . Tramadol     Nausea and vomiting  . Sulfa Antibiotics Rash    As an infant   Scheduled Meds: . arformoterol  15 mcg Nebulization BID  . budesonide (PULMICORT) nebulizer solution  0.25 mg Nebulization BID  . chlorhexidine  15 mL Mouth Rinse BID  . Chlorhexidine Gluconate Cloth  6 each Topical Q0600  . mouth rinse  15 mL Mouth Rinse q12n4p  . pantoprazole (PROTONIX) IV  40 mg Intravenous QHS  . thiamine injection  100 mg Intravenous Daily  . Treprostinil  18 mcg Inhalation QID   Continuous Infusions: . sodium chloride 20 mL/hr at 07/11/20 1400  . sodium chloride 75 mL/hr at  07/13/20 1000  . azithromycin Stopped (07/13/20 3009)  . cefTRIAXone  (ROCEPHIN)  IV 2 g (07/12/20 1801)  . dexmedetomidine (PRECEDEX) IV infusion 0.4 mcg/kg/hr (07/13/20 1028)  . phenylephrine (NEO-SYNEPHRINE) Adult infusion Stopped (07/11/20 0130)   PRN Meds:.acetaminophen, docusate sodium, ipratropium-albuterol, polyethylene glycol   REVIEW OF SYSTEMS:   Unable to assess due to AMS  SUBJECTIVE:  Unable to assess due to AMS  VITAL SIGNS: Temp:  [98.1 F (36.7 C)-103 F (39.4 C)] 102.6 F (39.2 C) (09/27 1000) Pulse Rate:  [36-103] 97 (09/27 1000) Resp:  [33-42] 34 (09/27 1000) BP: (109-152)/(78-115) 124/84 (09/27 1000) SpO2:  [93 %-99 %] 97 % (09/27 1000) FiO2 (%):  [30 %] 30 % (09/27 0841) Weight:  [88.4 kg] 88.4 kg (09/27 0500)  PHYSICAL EXAMINATION: General:  Acute on chronically ill appearing female, laying in bed, on BiPAP, with mild respiratory distress, confused Neuro: Confused, erratic following of commands, no focal deficits, pupils PERRL HEENT:  Atraumatic, normocephalic, neck supple, no JVD Cardiovascular: Regular rate and rhythm, s1s2, no M/R/G Lungs:  Diffuse rhonchi, no wheezing, mild tachypnea, mild assessory muscle use, BiPAP assisted Abdomen:  Obese, soft, nontender, nondistended, no guarding or rebound tenderness, BS+ x4 Musculoskeletal: No deformities, no edema Skin:  Warm and dry.  No obvious rashes, lesions, or ulcerations  Recent Labs  Lab 07/11/20 0445 07/12/20 0735 07/13/20 0316  NA 142 143 147*  K 4.7 4.8 4.4  CL 103 103 107  CO2 21* 24 27  BUN 66* 89* 88*  CREATININE 4.49* 3.75* 3.12*  GLUCOSE 128* 164* 158*   Recent Labs  Lab 07/11/20 0445 07/12/20 0735 07/13/20 0316  HGB 11.9* 12.0 12.1  HCT 35.8* 35.6* 37.7  WBC 26.9* 33.4* 17.2*  PLT 67* 33* 24*   ABG    Component Value Date/Time   PHART 7.40 07/12/2020 1205   PCO2ART 39 07/12/2020 1205   PO2ART 95 07/12/2020 1205   HCO3 24.2 07/12/2020 1205   TCO2 29 02/20/2020 1156   ACIDBASEDEF 0.5 07/12/2020 1205   O2SAT 97.4 07/12/2020 1205      ASSESSMENT / PLAN:  Acute on Chronic Hypoxic Respiratory Failure secondary to acute Exacerbation of Pulmonary Sarcoidosis/ALI -Supplemental O2 as needed to maintain O2 sats >92% -BiPAP, wean as tolerated -High risk for intubation -Follow intermittent CXR & ABG as needed -ABX as above -IV Steroids, DC'd -LABA/ICS: Arformoterol and Pulmicort -holding tyvasso for now due to bipap  Acute metabolic encephalopathy Due to severe sepsis/gram-negative bacteremia Uremia Precedex General supportive care Restarted solumedrol  Support of renal function Thiamine Check free T4, TSH low   Severe sepsis secondary to Kleibsiella pneumoniae bactermia UTI, recurrent -Monitor fever curve -Trend WBC's & Procalcitonin -Follow cultures as above -Received Azithromycin & Ceftazidime in ED -Discontinued Vancomycin & Cefepime -Continue Rocephin   AKI -Anion Gap Metabolic Acidosis, corrected -Monitor I&O's / urinary output -Follow BMP -Ensure adequate renal perfusion -Avoid nephrotoxic agents as able -Replace electrolytes as indicated -Na Bicarb alternated with normal saline per renal -Nephrology consulted, appreciate input   Mildly elevated troponin, suspected demand ischemia -Continuous cardiac monitoring -Maintain MAP >65 -EKG without ST changes   Transaminitis -NPO -Trend LFT's -Obtain Abdominal US   BEST PRACTICES DISPOSITION: ICU GOALS OF CARE: Full Code VTE Prophylaxis: Heparin SQ Consults: Nephrology Updates: No family at bedside for update 07/12/20 during MD rounds  Critical care provider statement:    Critical care time (minutes):  33   Critical care time was exclusive of:  Separately billable procedures and  treating  other patients   Critical care was necessary to treat or prevent imminent or  life-threatening deterioration of the following conditions:  sepsis, klebsiella bacteremia, multiple comorbid conditions.    Critical care was time spent personally by  me on the following  activities:  Development of treatment plan with patient or surrogate,  discussions with consultants, evaluation of patient's response to  treatment, examination of patient, obtaining history from patient or  surrogate, ordering and performing treatments and interventions, ordering  and review of laboratory studies and re-evaluation of patient's condition   I assumed direction of critical care for this patient from another  provider in my specialty: no     Ottie Glazier, M.D.  Pulmonary & [place]   07/13/2020, 11:02 AM  *This note was dictated using voice recognition software/Dragon.  Despite best efforts to proofread, errors can occur which can change the meaning.  Any change was purely unintentional.

## 2020-07-13 NOTE — Plan of Care (Signed)
  Problem: Health Behavior/Discharge Planning: Goal: Ability to manage health-related needs will improve Outcome: Not Progressing   Problem: Clinical Measurements: Goal: Ability to maintain clinical measurements within normal limits will improve Outcome: Not Progressing Goal: Will remain free from infection Outcome: Not Progressing Goal: Diagnostic test results will improve Outcome: Not Progressing Goal: Respiratory complications will improve Outcome: Not Progressing Goal: Cardiovascular complication will be avoided Outcome: Not Progressing   Problem: Activity: Goal: Risk for activity intolerance will decrease Outcome: Not Progressing   Problem: Nutrition: Goal: Adequate nutrition will be maintained Outcome: Not Progressing   Problem: Coping: Goal: Level of anxiety will decrease Outcome: Not Progressing   Problem: Elimination: Goal: Will not experience complications related to bowel motility Outcome: Not Progressing Goal: Will not experience complications related to urinary retention Outcome: Not Progressing   Problem: Pain Managment: Goal: General experience of comfort will improve Outcome: Not Progressing   Problem: Safety: Goal: Ability to remain free from injury will improve Outcome: Not Progressing   Problem: Skin Integrity: Goal: Risk for impaired skin integrity will decrease Outcome: Not Progressing

## 2020-07-14 ENCOUNTER — Inpatient Hospital Stay: Payer: BC Managed Care – PPO

## 2020-07-14 ENCOUNTER — Encounter: Payer: Self-pay | Admitting: Pulmonary Disease

## 2020-07-14 ENCOUNTER — Encounter
Admission: EM | Disposition: A | Discharge status: Rehabilitation Facility | Payer: Self-pay | Source: Home / Self Care | Attending: Pulmonary Disease

## 2020-07-14 ENCOUNTER — Inpatient Hospital Stay: Payer: BC Managed Care – PPO | Admitting: Certified Registered Nurse Anesthetist

## 2020-07-14 DIAGNOSIS — N201 Calculus of ureter: Secondary | ICD-10-CM

## 2020-07-14 DIAGNOSIS — A419 Sepsis, unspecified organism: Secondary | ICD-10-CM

## 2020-07-14 HISTORY — PX: CYSTOSCOPY WITH STENT PLACEMENT: SHX5790

## 2020-07-14 LAB — GLUCOSE 6 PHOSPHATE DEHYDROGENASE
G6PDH: 12.4 U/g{Hb} (ref 4.8–15.7)
Hemoglobin: 11.7 g/dL (ref 11.1–15.9)

## 2020-07-14 LAB — BLOOD GAS, ARTERIAL
Acid-Base Excess: 4.6 mmol/L — ABNORMAL HIGH (ref 0.0–2.0)
Bicarbonate: 32.7 mmol/L — ABNORMAL HIGH (ref 20.0–28.0)
FIO2: 0.4
MECHVT: 420 mL
O2 Saturation: 96.4 %
PEEP: 5 cmH2O
Patient temperature: 37
RATE: 18 resp/min
pCO2 arterial: 65 mmHg — ABNORMAL HIGH (ref 32.0–48.0)
pH, Arterial: 7.31 — ABNORMAL LOW (ref 7.350–7.450)
pO2, Arterial: 92 mmHg (ref 83.0–108.0)

## 2020-07-14 LAB — PROTIME-INR
INR: 1.3 — ABNORMAL HIGH (ref 0.8–1.2)
Prothrombin Time: 15.8 seconds — ABNORMAL HIGH (ref 11.4–15.2)

## 2020-07-14 LAB — GLUCOSE, CAPILLARY
Glucose-Capillary: 166 mg/dL — ABNORMAL HIGH (ref 70–99)
Glucose-Capillary: 171 mg/dL — ABNORMAL HIGH (ref 70–99)
Glucose-Capillary: 182 mg/dL — ABNORMAL HIGH (ref 70–99)
Glucose-Capillary: 182 mg/dL — ABNORMAL HIGH (ref 70–99)
Glucose-Capillary: 208 mg/dL — ABNORMAL HIGH (ref 70–99)
Glucose-Capillary: 229 mg/dL — ABNORMAL HIGH (ref 70–99)

## 2020-07-14 LAB — CBC WITH DIFFERENTIAL/PLATELET
Abs Immature Granulocytes: 0.26 10*3/uL — ABNORMAL HIGH (ref 0.00–0.07)
Basophils Absolute: 0.1 10*3/uL (ref 0.0–0.1)
Basophils Relative: 0 %
Eosinophils Absolute: 0 10*3/uL (ref 0.0–0.5)
Eosinophils Relative: 0 %
HCT: 40.6 % (ref 36.0–46.0)
Hemoglobin: 12.7 g/dL (ref 12.0–15.0)
Immature Granulocytes: 1 %
Lymphocytes Relative: 6 %
Lymphs Abs: 1 10*3/uL (ref 0.7–4.0)
MCH: 30.3 pg (ref 26.0–34.0)
MCHC: 31.3 g/dL (ref 30.0–36.0)
MCV: 96.9 fL (ref 80.0–100.0)
Monocytes Absolute: 0.8 10*3/uL (ref 0.1–1.0)
Monocytes Relative: 4 %
Neutro Abs: 16.4 10*3/uL — ABNORMAL HIGH (ref 1.7–7.7)
Neutrophils Relative %: 89 %
Platelets: 39 10*3/uL — ABNORMAL LOW (ref 150–400)
RBC: 4.19 MIL/uL (ref 3.87–5.11)
RDW: 14.3 % (ref 11.5–15.5)
WBC: 18.6 10*3/uL — ABNORMAL HIGH (ref 4.0–10.5)
nRBC: 0.2 % (ref 0.0–0.2)

## 2020-07-14 LAB — COMPREHENSIVE METABOLIC PANEL
ALT: 304 U/L — ABNORMAL HIGH (ref 0–44)
AST: 217 U/L — ABNORMAL HIGH (ref 15–41)
Albumin: 2.7 g/dL — ABNORMAL LOW (ref 3.5–5.0)
Alkaline Phosphatase: 101 U/L (ref 38–126)
Anion gap: 16 — ABNORMAL HIGH (ref 5–15)
BUN: 83 mg/dL — ABNORMAL HIGH (ref 8–23)
CO2: 24 mmol/L (ref 22–32)
Calcium: 7.4 mg/dL — ABNORMAL LOW (ref 8.9–10.3)
Chloride: 110 mmol/L (ref 98–111)
Creatinine, Ser: 2.46 mg/dL — ABNORMAL HIGH (ref 0.44–1.00)
GFR calc Af Amer: 23 mL/min — ABNORMAL LOW (ref >60)
GFR calc non Af Amer: 20 mL/min — ABNORMAL LOW (ref >60)
Glucose, Bld: 188 mg/dL — ABNORMAL HIGH (ref 70–99)
Potassium: 4.1 mmol/L (ref 3.5–5.1)
Sodium: 150 mmol/L — ABNORMAL HIGH (ref 135–145)
Total Bilirubin: 0.8 mg/dL (ref 0.3–1.2)
Total Protein: 6.4 g/dL — ABNORMAL LOW (ref 6.5–8.1)

## 2020-07-14 LAB — HEPARIN INDUCED PLATELET AB (HIT ANTIBODY): Heparin Induced Plt Ab: 0.175 OD (ref 0.000–0.400)

## 2020-07-14 LAB — MAGNESIUM: Magnesium: 2.5 mg/dL — ABNORMAL HIGH (ref 1.7–2.4)

## 2020-07-14 LAB — FUNGITELL, SERUM: Fungitell Result: 37 pg/mL (ref <80)

## 2020-07-14 LAB — PHOSPHORUS: Phosphorus: 5.3 mg/dL — ABNORMAL HIGH (ref 2.5–4.6)

## 2020-07-14 SURGERY — CYSTOSCOPY, WITH STENT INSERTION
Anesthesia: General | Laterality: Bilateral

## 2020-07-14 MED ORDER — DOCUSATE SODIUM 50 MG/5ML PO LIQD
100.0000 mg | Freq: Two times a day (BID) | ORAL | Status: DC
  Administered 2020-07-18 – 2020-07-28 (×16): 100 mg via ORAL
  Filled 2020-07-14 (×21): qty 10

## 2020-07-14 MED ORDER — DEXAMETHASONE SODIUM PHOSPHATE 10 MG/ML IJ SOLN
INTRAMUSCULAR | Status: DC | PRN
  Administered 2020-07-14: 10 mg via INTRAVENOUS

## 2020-07-14 MED ORDER — FENTANYL CITRATE (PF) 100 MCG/2ML IJ SOLN
INTRAMUSCULAR | Status: DC | PRN
Start: 2020-07-14 — End: 2020-07-14
  Administered 2020-07-14: 50 ug via INTRAVENOUS

## 2020-07-14 MED ORDER — PHENYLEPHRINE CONCENTRATED 100MG/250ML (0.4 MG/ML) INFUSION SIMPLE: 0.0000 ug/min | INTRAVENOUS | Status: DC

## 2020-07-14 MED ORDER — PROPOFOL 10 MG/ML IV BOLUS
INTRAVENOUS | Status: AC
  Filled 2020-07-14: qty 20

## 2020-07-14 MED ORDER — FENTANYL BOLUS VIA INFUSION
25.0000 ug | INTRAVENOUS | Status: DC | PRN
  Filled 2020-07-14: qty 100

## 2020-07-14 MED ORDER — SODIUM CHLORIDE 0.9 % IV SOLN: 250.0000 mL | INTRAVENOUS | Status: DC

## 2020-07-14 MED ORDER — ETOMIDATE 2 MG/ML IV SOLN
INTRAVENOUS | Status: DC | PRN
  Administered 2020-07-14: 10 mg via INTRAVENOUS

## 2020-07-14 MED ORDER — PHENYLEPHRINE HCL-NACL 10-0.9 MG/250ML-% IV SOLN
25.0000 ug/min | INTRAVENOUS | Status: DC
  Filled 2020-07-14: qty 250

## 2020-07-14 MED ORDER — ACETAMINOPHEN 10 MG/ML IV SOLN
INTRAVENOUS | Status: AC
  Filled 2020-07-14: qty 100

## 2020-07-14 MED ORDER — MIDAZOLAM HCL 2 MG/2ML IJ SOLN
INTRAMUSCULAR | Status: AC
  Filled 2020-07-14: qty 2

## 2020-07-14 MED ORDER — ONDANSETRON HCL 4 MG/2ML IJ SOLN
INTRAMUSCULAR | Status: DC | PRN
  Administered 2020-07-14: 4 mg via INTRAVENOUS

## 2020-07-14 MED ORDER — ACETAMINOPHEN 10 MG/ML IV SOLN
INTRAVENOUS | Status: DC | PRN
  Administered 2020-07-14: 1000 mg via INTRAVENOUS

## 2020-07-14 MED ORDER — FENTANYL CITRATE (PF) 100 MCG/2ML IJ SOLN: 25.0000 ug | INTRAMUSCULAR | Status: DC | PRN

## 2020-07-14 MED ORDER — METOPROLOL TARTRATE 5 MG/5ML IV SOLN
5.0000 mg | Freq: Once | INTRAVENOUS | Status: AC
  Administered 2020-07-14: 5 mg via INTRAVENOUS
  Filled 2020-07-14: qty 5

## 2020-07-14 MED ORDER — LIDOCAINE HCL (CARDIAC) PF 100 MG/5ML IV SOSY
PREFILLED_SYRINGE | INTRAVENOUS | Status: DC | PRN
  Administered 2020-07-14: 100 mg via INTRAVENOUS

## 2020-07-14 MED ORDER — PROPOFOL 1000 MG/100ML IV EMUL
0.0000 ug/kg/min | INTRAVENOUS | Status: DC
  Administered 2020-07-14: 10 ug/kg/min via INTRAVENOUS
  Filled 2020-07-14: qty 100

## 2020-07-14 MED ORDER — MIDAZOLAM HCL 2 MG/2ML IJ SOLN
INTRAMUSCULAR | Status: DC | PRN
  Administered 2020-07-14 (×2): 1 mg via INTRAVENOUS

## 2020-07-14 MED ORDER — FENTANYL 2500MCG IN NS 250ML (10MCG/ML) PREMIX INFUSION
0.0000 ug/h | INTRAVENOUS | Status: DC
  Administered 2020-07-14: 100 ug/h via INTRAVENOUS

## 2020-07-14 MED ORDER — ETOMIDATE 2 MG/ML IV SOLN
INTRAVENOUS | Status: AC
  Filled 2020-07-14: qty 10

## 2020-07-14 MED ORDER — SODIUM CHLORIDE 0.9 % IV SOLN
25.0000 ug/min | INTRAVENOUS | Status: DC
  Administered 2020-07-14: 100 ug/min via INTRAVENOUS
  Administered 2020-07-14: 25 ug/min via INTRAVENOUS
  Administered 2020-07-15 (×2): 50 ug/min via INTRAVENOUS
  Filled 2020-07-14 (×2): qty 10
  Filled 2020-07-14 (×3): qty 1

## 2020-07-14 MED ORDER — FENTANYL CITRATE (PF) 100 MCG/2ML IJ SOLN
INTRAMUSCULAR | Status: AC
  Filled 2020-07-14: qty 2

## 2020-07-14 MED ORDER — DEXMEDETOMIDINE HCL IN NACL 400 MCG/100ML IV SOLN
0.4000 ug/kg/h | INTRAVENOUS | Status: DC
  Administered 2020-07-14: 0.6 ug/kg/h via INTRAVENOUS
  Administered 2020-07-15: 0.4 ug/kg/h via INTRAVENOUS
  Administered 2020-07-15: 1 ug/kg/h via INTRAVENOUS
  Filled 2020-07-14 (×3): qty 100

## 2020-07-14 MED ORDER — METOPROLOL TARTRATE 5 MG/5ML IV SOLN
5.0000 mg | Freq: Four times a day (QID) | INTRAVENOUS | Status: DC
  Administered 2020-07-14: 5 mg via INTRAVENOUS
  Filled 2020-07-14: qty 5

## 2020-07-14 MED ORDER — POLYETHYLENE GLYCOL 3350 17 G PO PACK
17.0000 g | PACK | Freq: Every day | ORAL | Status: DC
  Administered 2020-07-21: 17 g via ORAL
  Filled 2020-07-14 (×4): qty 1

## 2020-07-14 MED ORDER — FENTANYL 2500MCG IN NS 250ML (10MCG/ML) PREMIX INFUSION
INTRAVENOUS | Status: AC
  Filled 2020-07-14: qty 250

## 2020-07-14 MED ORDER — ORAL CARE MOUTH RINSE
15.0000 mL | OROMUCOSAL | Status: DC
  Administered 2020-07-14 – 2020-07-19 (×46): 15 mL via OROMUCOSAL

## 2020-07-14 MED ORDER — ROCURONIUM BROMIDE 100 MG/10ML IV SOLN
INTRAVENOUS | Status: DC | PRN
  Administered 2020-07-14: 50 mg via INTRAVENOUS

## 2020-07-14 MED ORDER — SODIUM CHLORIDE 0.9 % IV SOLN
100.0000 mg | Freq: Two times a day (BID) | INTRAVENOUS | Status: DC
  Administered 2020-07-14: 100 mg via INTRAVENOUS
  Filled 2020-07-14 (×4): qty 100

## 2020-07-14 MED ORDER — SUCCINYLCHOLINE CHLORIDE 20 MG/ML IJ SOLN
INTRAMUSCULAR | Status: DC | PRN
  Administered 2020-07-14: 100 mg via INTRAVENOUS

## 2020-07-14 MED ORDER — IOHEXOL 180 MG/ML  SOLN
INTRAMUSCULAR | Status: DC | PRN
  Administered 2020-07-14: 20 mL

## 2020-07-14 MED ORDER — PHENYLEPHRINE HCL (PRESSORS) 10 MG/ML IV SOLN
INTRAVENOUS | Status: DC | PRN
  Administered 2020-07-14: 200 ug via INTRAVENOUS
  Administered 2020-07-14: 100 ug via INTRAVENOUS
  Administered 2020-07-14: 200 ug via INTRAVENOUS

## 2020-07-14 MED ORDER — CHLORHEXIDINE GLUCONATE 0.12% ORAL RINSE (MEDLINE KIT)
15.0000 mL | Freq: Two times a day (BID) | OROMUCOSAL | Status: DC
  Administered 2020-07-14 – 2020-07-20 (×10): 15 mL via OROMUCOSAL

## 2020-07-14 SURGICAL SUPPLY — 24 items
BAG DRAIN CYSTO-URO LG1000N (MISCELLANEOUS) ×2 IMPLANT
BRUSH SCRUB EZ 1% IODOPHOR (MISCELLANEOUS) IMPLANT
CATH FOL 2WAY LX 18X30 (CATHETERS) ×2 IMPLANT
CATH URETL 5X70 OPEN END (CATHETERS) ×2 IMPLANT
CONRAY 43 FOR UROLOGY 50M (MISCELLANEOUS) ×2 IMPLANT
GLOVE BIOGEL PI IND STRL 7.5 (GLOVE) ×1 IMPLANT
GLOVE BIOGEL PI INDICATOR 7.5 (GLOVE) ×1
GOWN STRL REUS W/ TWL LRG LVL3 (GOWN DISPOSABLE) ×1 IMPLANT
GOWN STRL REUS W/ TWL XL LVL3 (GOWN DISPOSABLE) ×1 IMPLANT
GOWN STRL REUS W/TWL LRG LVL3 (GOWN DISPOSABLE) ×2
GOWN STRL REUS W/TWL XL LVL3 (GOWN DISPOSABLE) ×2
GUIDEWIRE STR DUAL SENSOR (WIRE) ×2 IMPLANT
KIT TURNOVER CYSTO (KITS) ×2 IMPLANT
PACK CYSTO AR (MISCELLANEOUS) ×2 IMPLANT
SET CYSTO W/LG BORE CLAMP LF (SET/KITS/TRAYS/PACK) ×2 IMPLANT
SOL .9 NS 3000ML IRR  AL (IV SOLUTION) ×1
SOL .9 NS 3000ML IRR AL (IV SOLUTION) ×1
SOL .9 NS 3000ML IRR UROMATIC (IV SOLUTION) ×1 IMPLANT
STENT URET 6FRX24 CONTOUR (STENTS) IMPLANT
STENT URET 6FRX26 CONTOUR (STENTS) ×4 IMPLANT
SURGILUBE 2OZ TUBE FLIPTOP (MISCELLANEOUS) ×2 IMPLANT
SYR TOOMEY IRRIG 70ML (MISCELLANEOUS)
SYRINGE TOOMEY IRRIG 70ML (MISCELLANEOUS) IMPLANT
WATER STERILE IRR 1000ML POUR (IV SOLUTION) ×2 IMPLANT

## 2020-07-14 NOTE — Progress Notes (Signed)
Name: Stacie [name] MRN: [id number] DOB: 13-Jan-1956    ADMISSION DATE:  07/09/2020 INITIAL CONSULTATION DATE:  07/10/2020  REFERRING MD :  Dr. Owens Shark  CHIEF COMPLAINT:  Altered Mental Status & Shortness of Breath  BRIEF PATIENT DESCRIPTION:  64 y.o. Female admitted with Sepsis and Acute on Chronic Hypoxic Respiratory Failure in the setting of Community Acquired Pneumonia and Acute Exacerbation of Pulmonary Sarcoidosis requiring BiPAP. Pt also with UTI and AKI, Nephrology has been consulted.  SIGNIFICANT EVENTS  9/23: Presented to ED, placed on BiPAP 9/24: PCCM asked to admit 9/25: Klebsiella pneumoniae bacteremia due to UTI, associated encephalopathy 9/26: Persistent encephalopathy 9/27- Patient remains encephalopathic on BIPAP, she is oozing blood from peripheral veins and both LE toes are with ischemic changes which is new per husband.  I have called and updated Dr Kathyrn Sheriff regarding patients condition.  Concern is possible DIC onset since INR is incrementing up and PIV oozing on exam.  Patient is not on vasopressor support at this time. Restarted solumedrol due to chronic steroid therapy at home. Discussed case with Dr Candiss Norse nephrology and Dr Duwayne Heck.   9/28- patient with improved platelets this am, LFTs abnormal will have [place] RUQ today. ID on case s/p evaluation. Patient off sedation and is able to use BIPAP with AVAPS setting no overnight events.  Met with family at bedside including husband Dr Kathyrn Sheriff and daughter [name].   STUDIES:  9/23: CXR>>Cardiac shadow is stable. Chronic scarring is noted in the apices bilaterally with hilar retraction consistent with the given clinical history of prior sarcoidosis. Patchy increased airspace opacities are noted in the lower lungs which may represent some acute on chronic infiltrate. Changes of prior vertebral augmentation are seen. No acute bony abnormality is noted. 9/24: CT Head>>Chronic white matter ischemic changes without acute  abnormality 9/24: CT Chest w/o Contrast>>Chronic changes of pulmonary sarcoidosis with biapical predominant pulmonary fibrosis. Very mild superimposed active inflammatory infiltrate. Morphologic changes compatible with pulmonary arterial hypertension.  CULTURES: SARS-CoV-2 PCR 9/23>> negative Influenza A&B 9/23>>negative Respiratory Viral Panel 9/24>> negative Blood culture 9/23>> Klebsiella pneumoniae all 4 bottles Sputum 9/24>> unable to obtain, nonproductive Urine 9/23>> Klebsiella pneumonia Strep pneumo urinary antigen 9/24>> not obtained Legionella urinary antigen 9/24>> not obtained Aspergillus 9/24>> pending Fungitel 9/24>> pending C. Diff 9/24>> no sample  ANTIBIOTICS: Azithromycin x1 dose 9/23 Ceftazidime x1 dose 9/23 Cefepime 9/24>> 9/24 Vancomycin 9/24>> 9/24 Ceftriaxone 9/24>>  PATIENT PROFILE:   Stacie [name] is a 64 y.o. Female with a past medical history significant for pulmonary sarcoidosis (on 2L [place] at baseline), pulmonary hypertension, OSA, asthma, hypertension, hypothyroidism, IBS, and chronic kidney disease who presents to [facility] ED on 07/09/20 due to altered mental status and shortness of breath.  Pt is currently confused and unable to contribute to history, and no family present, therefore history is obtained from ED and nursing notes.  PCCM asked to admit patient.  Patient has Klebsiella pneumonia bacteremia and severe sepsis.  Encephalopathy secondary to the same.   Allergies  Allergen Reactions  . Corn-Containing Products Diarrhea    headaches  . Nitrofurantoin Nausea Only  . Tramadol     Nausea and vomiting  . Sulfa Antibiotics Rash    As an infant   Scheduled Meds: . arformoterol  15 mcg Nebulization BID  . budesonide (PULMICORT) nebulizer solution  0.25 mg Nebulization BID  . chlorhexidine  15 mL Mouth Rinse BID  . Chlorhexidine Gluconate Cloth  6 each Topical Q0600  . mouth rinse  15 mL Mouth  Rinse q12n4p  . methylPREDNISolone (SOLU-MEDROL)  injection  40 mg Intravenous Daily  . pantoprazole (PROTONIX) IV  40 mg Intravenous QHS  . thiamine injection  100 mg Intravenous Daily  . Treprostinil  18 mcg Inhalation QID   Continuous Infusions: . sodium chloride 20 mL/hr at 07/11/20 1400  . azithromycin 500 mg (07/14/20 0751)  . calcium gluconate 1,000 mg (07/13/20 2217)  . cefTRIAXone (ROCEPHIN)  IV Stopped (07/13/20 1822)  . phenylephrine (NEO-SYNEPHRINE) Adult infusion Stopped (07/11/20 0130)   PRN Meds:.acetaminophen, docusate sodium, polyethylene glycol   REVIEW OF SYSTEMS:   Unable to assess due to AMS  SUBJECTIVE:  Unable to assess due to AMS  VITAL SIGNS: Temp:  [97.4 F (36.3 C)-99.7 F (37.6 C)] 98.3 F (36.8 C) (09/28 0600) Pulse Rate:  [75-125] 112 (09/28 1000) Resp:  [20-44] 25 (09/28 1000) BP: (109-161)/(68-118) 152/101 (09/28 1000) SpO2:  [93 %-100 %] 96 % (09/28 1000) FiO2 (%):  [30 %] 30 % (09/28 1000) Weight:  [86.2 kg] 86.2 kg (09/28 0500)  PHYSICAL EXAMINATION: General:  Acute on chronically ill appearing female, laying in bed, on BiPAP, with mild respiratory distress, confused Neuro: Confused, erratic following of commands, no focal deficits, pupils PERRL HEENT:  Atraumatic, normocephalic, neck supple, no JVD Cardiovascular: Regular rate and rhythm, s1s2, no M/R/G Lungs:  Diffuse rhonchi, no wheezing, mild tachypnea, mild assessory muscle use, BiPAP assisted Abdomen:  Obese, soft, nontender, nondistended, no guarding or rebound tenderness, BS+ x4 Musculoskeletal: No deformities, no edema Skin:  Warm and dry.  No obvious rashes, lesions, or ulcerations  Recent Labs  Lab 07/12/20 0735 07/13/20 0316 07/14/20 0404  NA 143 147* 150*  K 4.8 4.4 4.1  CL 103 107 110  CO2 _0 BUN 89* 88* 83*  CREATININE 3.75* 3.12* 2.46*  GLUCOSE 164* 158* 188*   Recent Labs  Lab 07/12/20 0735 07/13/20 0316 07/14/20 0404  HGB 12.0 12.1 12.7  HCT 35.6* 37.7 40.6  WBC 33.4* 17.2* 18.6*  PLT 33* 24*  39*   ABG    Component Value Date/Time   PHART 7.40 07/12/2020 1205   PCO2ART 39 07/12/2020 1205   PO2ART 95 07/12/2020 1205   HCO3 24.2 07/12/2020 1205   TCO2 29 02/20/2020 1156   ACIDBASEDEF 0.5 07/12/2020 1205   O2SAT 97.4 07/12/2020 1205     ASSESSMENT / PLAN:  Acute on Chronic Hypoxic Respiratory Failure secondary to acute Exacerbation of Pulmonary Sarcoidosis/ALI -Supplemental O2 as needed to maintain O2 sats >92% -BiPAP, wean as tolerated -High risk for intubation -Follow intermittent CXR & ABG as needed -ABX as above -IV Steroids restarted -LABA/ICS: Arformoterol and Pulmicort -continue tyvasso between BIPAP -  ongoing  Acute metabolic encephalopathy Due to severe sepsis/gram-negative bacteremia- klebsiella    - present on admission     - due to UTI Uremia Precedex General supportive care Restarted solumedrol  Support of renal function Thiamine Check free T4, TSH low -Rocephin/zithromax for abx - ID on case appreciate input  Severe sepsis secondary to Kleibsiella pneumoniae bactermia UTI, recurrent -Monitor fever curve -Trend WBC's & Procalcitonin -Follow cultures as above -Received Azithromycin & Ceftazidime in ED>>rocephin /zithromax -Discontinued Vancomycin & Cefepime -Continue Rocephin   AKI -Anion Gap Metabolic Acidosis, corrected -Monitor I&O's / urinary output -Follow BMP -Ensure adequate renal perfusion -Avoid nephrotoxic agents as able -Replace electrolytes as indicated -Na Bicarb alternated with normal saline per renal -Nephrology consulted, appreciate input   Mildly elevated troponin, suspected demand ischemia -Continuous cardiac monitoring -Maintain MAP >  24 -EKG without ST changes   Transaminitis -NPO -Trend LFT's -Obtain Abdominal US   BEST PRACTICES DISPOSITION: ICU GOALS OF CARE: Full Code VTE Prophylaxis: Heparin SQ Consults: Nephrology Updates: No family at bedside for update 07/12/20 during MD rounds  Critical  care provider statement:    Critical care time (minutes):  33   Critical care time was exclusive of:  Separately billable procedures and  treating other patients   Critical care was necessary to treat or prevent imminent or  life-threatening deterioration of the following conditions:  sepsis, klebsiella bacteremia, multiple comorbid conditions.    Critical care was time spent personally by me on the following  activities:  Development of treatment plan with patient or surrogate,  discussions with consultants, evaluation of patient's response to  treatment, examination of patient, obtaining history from patient or  surrogate, ordering and performing treatments and interventions, ordering  and review of laboratory studies and re-evaluation of patient's condition   I assumed direction of critical care for this patient from another  provider in my specialty: no     Ottie Glazier, M.D.  Pulmonary [facility] [place]   07/14/2020, 10:55 AM  *This note was dictated using voice recognition software/Dragon.  Despite best efforts to proofread, errors can occur which can change the meaning.  Any change was purely unintentional.

## 2020-07-14 NOTE — Progress Notes (Signed)
[facility] [place], [place] 07/14/20  Subjective:   Hospital day # 4 Patient continues to be on BIPAP, arousable to call,has mittens in bilateral hands. Renal: 09/27 0701 - 09/28 0700 In: 1306 [I.V.:856.2; IV Piggyback:449.8] Out: 2825 [Urine:2825] Lab Results  Component Value Date   CREATININE 2.46 (H) 07/14/2020   CREATININE 3.12 (H) 07/13/2020   CREATININE 3.75 (H) 07/12/2020     Objective:  Vital signs in last 24 hours:  Temp:  [97.4 F (36.3 C)-101.9 F (38.8 C)] 101.9 F (38.8 C) (09/28 1417) Pulse Rate:  [75-129] 117 (09/28 1417) Resp:  [20-43] 25 (09/28 1417) BP: (109-169)/(68-118) 165/90 (09/28 1417) SpO2:  [93 %-100 %] 95 % (09/28 1417) FiO2 (%):  [30 %] 30 % (09/28 1417) Weight:  [86.2 kg] 86.2 kg (09/28 0500)  Weight change: -2.2 kg Filed Weights   07/12/20 0500 07/13/20 0500 07/14/20 0500  Weight: 90.6 kg 88.4 kg 86.2 kg    Intake/Output:    Intake/Output Summary (Last 24 hours) at 07/14/2020 1529 Last data filed at 07/14/2020 1230 Gross per 24 hour  Intake 427 ml  Output 3450 ml  Net -3023 ml     Physical Exam: General:  Critically ill-appearing, arousable to call  HEENT  Normocephalic, BiPAP mask in place  Pulm/lungs  Lungs with crackles bilaterally  CVS/Heart  S1S2,tachycardic  Abdomen:   Soft, nondistended  Extremities:  Trace  edema  Neurologic:  Opens eyes to call  Skin:  Cool, purple toes 1,2 on right, left dorsal chronic purple lesion          Basic Metabolic Panel:  Recent Labs  Lab 07/10/20 0504 07/10/20 0504 07/10/20 1418 07/10/20 1418 07/10/20 2259 07/10/20 2259 07/11/20 0445 07/11/20 0445 07/12/20 0735 07/13/20 0316 07/14/20 0404  NA 141   < > 141   < > 141  --  142  --  143 147* 150*  K 4.6   < > 5.2*   < > 5.9*  --  4.7  --  4.8 4.4 4.1  CL 104   < > 106   < > 102  --  103  --  103 107 110  CO2 20*   < > 20*   < > 22  --  21*  --  24 27 24   GLUCOSE 80   < > 91   < > 122*  --  128*  --   164* 158* 188*  BUN 38*   < > 49*   < > 59*  --  66*  --  89* 88* 83*  CREATININE 4.05*   < > 4.22*   < > 4.57*  --  4.49*  --  3.75* 3.12* 2.46*  CALCIUM 7.2*   < > 7.0*   < > 7.4*   < > 7.2*   < > 7.1* 6.6* 7.4*  MG 1.3*  --   --   --   --   --  2.8*  --  2.8* 2.6* 2.5*  PHOS 6.8*  --  6.5*  --   --   --   --   --  6.8* 4.3 5.3*   < > = values in this interval not displayed.     CBC: Recent Labs  Lab 07/10/20 0504 07/10/20 0504 07/11/20 0445 07/12/20 0735 07/13/20 0316 07/13/20 1301 07/14/20 0404  WBC 34.4*  --  26.9* 33.4* 17.2*  --  18.6*  NEUTROABS 30.3*  --  21.9* 31.5* 15.1*  --  16.4*  HGB 11.2*   < > 11.9* 12.0 12.1 11.7 12.7  HCT 34.6*  --  35.8* 35.6* 37.7  --  40.6  MCV 96.9  --  94.0 92.2 95.7  --  96.9  PLT 133*  --  67* 33* 24*  --  39*   < > = values in this interval not displayed.     No results found for: HEPBSAG, HEPBSAB, HEPBIGM    Microbiology:  Recent Results (from the past 240 hour(s))  Urine culture     Status: Abnormal   Collection Time: 07/09/20 11:27 PM   Specimen: In/Out Cath Urine  Result Value Ref Range Status   Specimen Description   Final    IN/OUT CATH URINE Performed at [facility], [address] [place], [place] [address]    Special Requests   Final    NONE Performed at [facility], [address] [place], [place] [address]    Culture >=100,000 COLONIES/mL KLEBSIELLA PNEUMONIAE (A)  Final   Report Status 07/12/2020 FINAL  Final   Organism ID, Bacteria KLEBSIELLA PNEUMONIAE (A)  Final      Susceptibility   Klebsiella pneumoniae - MIC*    AMPICILLIN RESISTANT Resistant     CEFAZOLIN <=4 SENSITIVE Sensitive     CEFTRIAXONE <=0.25 SENSITIVE Sensitive     CIPROFLOXACIN <=0.25 SENSITIVE Sensitive     GENTAMICIN <=1 SENSITIVE Sensitive     IMIPENEM <=0.25 SENSITIVE Sensitive     NITROFURANTOIN <=16 SENSITIVE Sensitive     TRIMETH/SULFA <=20 SENSITIVE Sensitive     AMPICILLIN/SULBACTAM <=2 SENSITIVE  Sensitive     PIP/TAZO <=4 SENSITIVE Sensitive     * >=100,000 COLONIES/mL KLEBSIELLA PNEUMONIAE  Respiratory Panel by RT PCR (Flu A&B, Covid) - Nasopharyngeal Swab     Status: None   Collection Time: 07/09/20 11:28 PM   Specimen: Nasopharyngeal Swab  Result Value Ref Range Status   SARS Coronavirus 2 by RT PCR NEGATIVE NEGATIVE Final    Comment: (NOTE) SARS-CoV-2 target nucleic acids are NOT DETECTED.  The SARS-CoV-2 RNA is generally detectable in upper respiratoy specimens during the acute phase of infection. The lowest concentration of SARS-CoV-2 viral copies this assay can detect is 131 copies/mL. A negative result does not preclude SARS-Cov-2 infection and should not be used as the sole basis for treatment or other patient management decisions. A negative result may occur with  improper specimen collection/handling, submission of specimen other than nasopharyngeal swab, presence of viral mutation(s) within the areas targeted by this assay, and inadequate number of viral copies (<131 copies/mL). A negative result must be combined with clinical observations, patient history, and epidemiological information. The expected result is Negative.  Fact Sheet for Patients:  https://www.moore.com/  Fact Sheet for Healthcare Providers:  https://www.young.biz/  This test is no t yet approved or cleared by the [place] FDA and  has been authorized for detection and/or diagnosis of SARS-CoV-2 by FDA under an Emergency Use Authorization (EUA). This EUA will remain  in effect (meaning this test can be used) for the duration of the COVID-19 declaration under Section 564(b)(1) of the Act, 21 U.S.C. section 360bbb-3(b)(1), unless the authorization is terminated or revoked sooner.     Influenza A by PCR NEGATIVE NEGATIVE Final   Influenza B by PCR NEGATIVE NEGATIVE Final    Comment: (NOTE) The Xpert Xpress SARS-CoV-2/FLU/RSV assay is intended as an  aid in  the diagnosis of influenza from Nasopharyngeal swab specimens and  should not be used as a  sole basis for treatment. Nasal washings and  aspirates are unacceptable for Xpert Xpress SARS-CoV-2/FLU/RSV  testing.  Fact Sheet for Patients: https://www.moore.com/  Fact Sheet for Healthcare Providers: https://www.young.biz/  This test is not yet approved or cleared by the [place] FDA and  has been authorized for detection and/or diagnosis of SARS-CoV-2 by  FDA under an Emergency Use Authorization (EUA). This EUA will remain  in effect (meaning this test can be used) for the duration of the  Covid-19 declaration under Section 564(b)(1) of the Act, 21  U.S.C. section 360bbb-3(b)(1), unless the authorization is  terminated or revoked. Performed at [facility], [address] [place], [place] [address]   Blood Culture (routine x 2)     Status: Abnormal   Collection Time: 07/09/20 11:36 PM   Specimen: BLOOD  Result Value Ref Range Status   Specimen Description   Final    BLOOD RIGHT ANTECUBITAL Performed at [facility], [address] [place], [place] [address]    Special Requests   Final    BOTTLES DRAWN AEROBIC AND ANAEROBIC Blood Culture adequate volume Performed at [facility], [address] [place], [place] [address]    Culture  Setup Time   Final    Organism ID to follow GRAM NEGATIVE RODS IN BOTH AEROBIC AND ANAEROBIC BOTTLES CRITICAL RESULT CALLED TO, READ BACK BY AND VERIFIED WITH: AMY THOMPSON AT 1220 07/10/20 SDR Performed at [facility], [address] [place], [place] [address]    Culture KLEBSIELLA PNEUMONIAE (A)  Final   Report Status 07/12/2020 FINAL  Final   Organism ID, Bacteria KLEBSIELLA PNEUMONIAE  Final      Susceptibility   Klebsiella pneumoniae - MIC*    AMPICILLIN RESISTANT Resistant     CEFAZOLIN <=4 SENSITIVE Sensitive     CEFEPIME <=0.12 SENSITIVE  Sensitive     CEFTAZIDIME <=1 SENSITIVE Sensitive     CEFTRIAXONE <=0.25 SENSITIVE Sensitive     CIPROFLOXACIN <=0.25 SENSITIVE Sensitive     GENTAMICIN <=1 SENSITIVE Sensitive     IMIPENEM <=0.25 SENSITIVE Sensitive     TRIMETH/SULFA <=20 SENSITIVE Sensitive     AMPICILLIN/SULBACTAM <=2 SENSITIVE Sensitive     PIP/TAZO <=4 SENSITIVE Sensitive     * KLEBSIELLA PNEUMONIAE  Blood Culture ID Panel (Reflexed)     Status: Abnormal   Collection Time: 07/09/20 11:36 PM  Result Value Ref Range Status   Enterococcus faecalis NOT DETECTED NOT DETECTED Final   Enterococcus Faecium NOT DETECTED NOT DETECTED Final   Listeria monocytogenes NOT DETECTED NOT DETECTED Final   Staphylococcus species NOT DETECTED NOT DETECTED Final   Staphylococcus aureus (BCID) NOT DETECTED NOT DETECTED Final   Staphylococcus epidermidis NOT DETECTED NOT DETECTED Final   Staphylococcus lugdunensis NOT DETECTED NOT DETECTED Final   Streptococcus species NOT DETECTED NOT DETECTED Final   Streptococcus agalactiae NOT DETECTED NOT DETECTED Final   Streptococcus pneumoniae NOT DETECTED NOT DETECTED Final   Streptococcus pyogenes NOT DETECTED NOT DETECTED Final   A.calcoaceticus-baumannii NOT DETECTED NOT DETECTED Final   Bacteroides fragilis NOT DETECTED NOT DETECTED Final   Enterobacterales DETECTED (A) NOT DETECTED Final    Comment: Enterobacterales represent a large order of gram negative bacteria, not a single organism. CRITICAL RESULT CALLED TO, READ BACK BY AND VERIFIED WITH:  AMY THOMPSON AT 1220 04/09/20 SDR    Enterobacter cloacae complex NOT DETECTED NOT DETECTED Final   Escherichia coli NOT DETECTED NOT DETECTED Final   Klebsiella aerogenes NOT DETECTED NOT  DETECTED Final   Klebsiella oxytoca NOT DETECTED NOT DETECTED Final   Klebsiella pneumoniae DETECTED (A) NOT DETECTED Final    Comment: CRITICAL RESULT CALLED TO, READ BACK BY AND VERIFIED WITH:  AMY THOMPSON AT 1220 07/10/20 SDR    Proteus species NOT  DETECTED NOT DETECTED Final   Salmonella species NOT DETECTED NOT DETECTED Final   Serratia marcescens NOT DETECTED NOT DETECTED Final   Haemophilus influenzae NOT DETECTED NOT DETECTED Final   Neisseria meningitidis NOT DETECTED NOT DETECTED Final   Pseudomonas aeruginosa NOT DETECTED NOT DETECTED Final   Stenotrophomonas maltophilia NOT DETECTED NOT DETECTED Final   Candida albicans NOT DETECTED NOT DETECTED Final   Candida auris NOT DETECTED NOT DETECTED Final   Candida glabrata NOT DETECTED NOT DETECTED Final   Candida krusei NOT DETECTED NOT DETECTED Final   Candida parapsilosis NOT DETECTED NOT DETECTED Final   Candida tropicalis NOT DETECTED NOT DETECTED Final   Cryptococcus neoformans/gattii NOT DETECTED NOT DETECTED Final   CTX-M ESBL NOT DETECTED NOT DETECTED Final   Carbapenem resistance IMP NOT DETECTED NOT DETECTED Final   Carbapenem resistance KPC NOT DETECTED NOT DETECTED Final   Carbapenem resistance NDM NOT DETECTED NOT DETECTED Final   Carbapenem resist OXA 48 LIKE NOT DETECTED NOT DETECTED Final   Carbapenem resistance VIM NOT DETECTED NOT DETECTED Final    Comment: Performed at [facility], [address] [place], [place] [address]  Blood Culture (routine x 2)     Status: Abnormal   Collection Time: 07/09/20 11:37 PM   Specimen: BLOOD  Result Value Ref Range Status   Specimen Description   Final    BLOOD BLOOD RIGHT FOREARM Performed at [facility], [address] [place], [place] [address]    Special Requests   Final    BOTTLES DRAWN AEROBIC AND ANAEROBIC Blood Culture adequate volume Performed at [facility], [address] [place], [place] [address]    Culture  Setup Time   Final    GRAM NEGATIVE RODS IN BOTH AEROBIC AND ANAEROBIC BOTTLES CRITICAL RESULT CALLED TO, READ BACK BY AND VERIFIED WITH: AMY THOMPSON AT 1220 07/10/20 SDR Performed at [facility], [address] [place], [place] [address]    Culture  (A)  Final    KLEBSIELLA PNEUMONIAE SUSCEPTIBILITIES PERFORMED ON PREVIOUS CULTURE WITHIN THE LAST 5 DAYS. Performed at [facility], [address], [place], [place] [address]    Report Status 07/12/2020 FINAL  Final  Aspergillus Ag, BAL/Serum     Status: None   Collection Time: 07/10/20  5:04 AM   Specimen: Blood  Result Value Ref Range Status   Aspergillus Ag, BAL/Serum 0.03 0.00 - 0.49 Index Final    Comment: (NOTE) Performed At: [facility] [address] [place], [place] [id number] Jolene Schimke MD ON:[id number]   MRSA PCR Screening     Status: None   Collection Time: 07/10/20 12:54 PM   Specimen: Nasal Mucosa; Nasopharyngeal  Result Value Ref Range Status   MRSA by PCR NEGATIVE NEGATIVE Final    Comment:        The GeneXpert MRSA Assay (FDA approved for NASAL specimens only), is one component of a comprehensive MRSA colonization surveillance program. It is not intended to diagnose MRSA infection nor to guide or monitor treatment for MRSA infections. Performed at [facility], [address], [place], [place] [address]     Coagulation Studies: Recent Labs    07/13/20 1158 07/14/20 1334  LABPROT 16.9* 15.8*  INR 1.4* 1.3*    Urinalysis: No results for input(s): COLORURINE, LABSPEC, PHURINE, GLUCOSEU, HGBUR, BILIRUBINUR, KETONESUR, PROTEINUR, UROBILINOGEN, NITRITE, LEUKOCYTESUR in the last 72 hours.  Invalid input(s): APPERANCEUR    Imaging: US RENAL  Result Date: 07/13/2020 CLINICAL DATA:  Acute renal failure. EXAM: RENAL / URINARY TRACT ULTRASOUND COMPLETE COMPARISON:  03/13/2020 FINDINGS: Right Kidney: Renal measurements: 11.9 x 5.7 x 5.3 cm = volume: 187 mL. Echogenicity within normal limits. No mass or hydronephrosis visualized. Left Kidney: Renal measurements: 10.4 x 5.6 x 4.3 cm = volume: 129 mL. Echogenicity within normal limits. No mass or hydronephrosis visualized. Bladder: Decompressed by a Foley catheter. Other: Small  volume free fluid in the right upper quadrant. IMPRESSION: 1. Unremarkable appearance of the kidneys. No hydronephrosis. 2. Small volume right upper quadrant ascites. Electronically Signed   By: Sebastian Ache M.D.   On: 07/13/2020 14:30   US Venous Img Lower Bilateral (DVT)  Result Date: 07/13/2020 CLINICAL DATA:  Bilateral leg edema. EXAM: BILATERAL LOWER EXTREMITY VENOUS DOPPLER ULTRASOUND BILATERAL LOWER EXTREMITY VENOUS DOPPLER ULTRASOUND TECHNIQUE: Gray-scale sonography with graded compression, as well as color Doppler and duplex ultrasound were performed to evaluate the lower extremity deep venous systems from the level of the common femoral vein and including the common femoral, femoral, profunda femoral, popliteal and calf veins including the posterior tibial, peroneal and gastrocnemius veins when visible. The superficial great saphenous vein was also interrogated. Spectral Doppler was utilized to evaluate flow at rest and with distal augmentation maneuvers in the common femoral, femoral and popliteal veins. COMPARISON:  None. FINDINGS: RIGHT LOWER EXTREMITY Common Femoral Vein: No evidence of thrombus. Normal compressibility, respiratory phasicity and response to augmentation. Saphenofemoral Junction: No evidence of thrombus. Normal compressibility and flow on color Doppler imaging. Profunda Femoral Vein: No evidence of thrombus. Normal compressibility and flow on color Doppler imaging. Femoral Vein: No evidence of thrombus. Normal compressibility, respiratory phasicity and response to augmentation. Popliteal Vein: No evidence of thrombus. Normal compressibility, respiratory phasicity and response to augmentation. Calf Veins: No evidence of thrombus. Normal compressibility and flow on color Doppler imaging. Superficial Great Saphenous Vein: No evidence of thrombus. Normal compressibility. LEFT LOWER EXTREMITY Common Femoral Vein: No evidence of thrombus. Normal compressibility, respiratory phasicity and  response to augmentation. Saphenofemoral Junction: No evidence of thrombus. Normal compressibility and flow on color Doppler imaging. Profunda Femoral Vein: No evidence of thrombus. Normal compressibility and flow on color Doppler imaging. Femoral Vein: No evidence of thrombus. Normal compressibility, respiratory phasicity and response to augmentation. Popliteal Vein: No evidence of thrombus. Normal compressibility, respiratory phasicity and response to augmentation. Calf Veins: No evidence of thrombus. Normal compressibility and flow on color Doppler imaging. Superficial Great Saphenous Vein: No evidence of thrombus. Normal compressibility. IMPRESSION: No evidence of deep venous thrombosis in either lower extremity. Electronically Signed   By: Feliberto Harts MD   On: 07/13/2020 14:22   DG Chest Port 1 View  Result Date: 07/13/2020 CLINICAL DATA:  Shortness of breath.  History of sarcoidosis. EXAM: PORTABLE CHEST 1 VIEW COMPARISON:  Chest x-ray 07/09/2020, 01/10/201.  9 CT 07/10/2020. FINDINGS: Stable cardiomegaly. Stable bilateral hilar fullness again noted consistent with adenopathy. Bilateral chronic interstitial prominence. Findings consistent with the patient's known sarcoidosis. Superimposed acute interstitial process including pneumonitis cannot be excluded. Left base pleural-parenchymal thickening consistent scarring again noted. No pneumothorax. Prior thoracic vertebroplasties. IMPRESSION: Stable cardiomegaly. Bilateral hilar adenopathy and chronic interstitial prominence consistent with the patient's known sarcoidosis. Superimposed active interstitial process including  pneumonitis cannot be excluded. Electronically Signed   By: Maisie Fus  Register   On: 07/13/2020 14:21   ECHOCARDIOGRAM COMPLETE  Result Date: 07/13/2020    ECHOCARDIOGRAM REPORT   Patient Name:   Stacie [name] Date of Exam: 07/13/2020 Medical Rec #:  [id number]         Height:       67.0 in Accession #:    [id number]         Weight:       194.9 lb Date of Birth:  1956/09/14          BSA:          2.000 m Patient Age:    64 years          BP:           121/75 mmHg Patient Gender: F                 HR:           94 bpm. Exam Location:  [facility] Procedure: 2D Echo, Cardiac Doppler and Color Doppler Indications:     Abnormal ECG 794.31  History:         Patient has no prior history of Echocardiogram examinations.                  Pulmonary HTN; Signs/Symptoms:Murmur. Sarcoidosis.  Sonographer:     Cristela Blue RDCS (AE) Referring Phys:  [id number] Vida Rigger Diagnosing Phys: Arnoldo Hooker MD IMPRESSIONS  1. Left ventricular ejection fraction, by estimation, is 30 to 35%. The left ventricle has moderately decreased function. The left ventricle demonstrates global hypokinesis. The left ventricular internal cavity size was mildly dilated. Left ventricular diastolic parameters were normal.  2. Right ventricular systolic function is mildly reduced. The right ventricular size is moderately enlarged. There is moderately elevated pulmonary artery systolic pressure.  3. Left atrial size was moderately dilated.  4. Right atrial size was moderately dilated.  5. The mitral valve is normal in structure. Mild to moderate mitral valve regurgitation.  6. Tricuspid valve regurgitation is moderate.  7. The aortic valve is normal in structure. Aortic valve regurgitation is trivial. FINDINGS  Left Ventricle: Left ventricular ejection fraction, by estimation, is 30 to 35%. The left ventricle has moderately decreased function. The left ventricle demonstrates global hypokinesis. The left ventricular internal cavity size was mildly dilated. There is no left ventricular hypertrophy. Left ventricular diastolic parameters were normal. Right Ventricle: The right ventricular size is moderately enlarged. No increase in right ventricular wall thickness. Right ventricular systolic function is mildly reduced. There is moderately elevated pulmonary artery systolic pressure. Left  Atrium: Left atrial size was moderately dilated. Right Atrium: Right atrial size was moderately dilated. Pericardium: There is no evidence of pericardial effusion. Mitral Valve: The mitral valve is normal in structure. Mild to moderate mitral valve regurgitation. Tricuspid Valve: The tricuspid valve is normal in structure. Tricuspid valve regurgitation is moderate. Aortic Valve: The aortic valve is normal in structure. Aortic valve regurgitation is trivial. Aortic valve mean gradient measures 3.0 mmHg. Aortic valve peak gradient measures 5.3 mmHg. Aortic valve area, by VTI measures 2.00 cm. Pulmonic Valve: The pulmonic valve was normal in structure. Pulmonic valve regurgitation is trivial. Aorta: The aortic root and ascending aorta are structurally normal, with no evidence of dilitation. IAS/Shunts: No atrial level shunt detected by color flow Doppler.  LEFT VENTRICLE PLAX 2D LVIDd:         4.86 cm  Diastology LVIDs:         3.98 cm     LV e' medial:    3.05 cm/s LV PW:         0.89 cm     LV E/e' medial:  23.5 LV IVS:        0.71 cm     LV e' lateral:   3.59 cm/s LVOT diam:     2.00 cm     LV E/e' lateral: 19.9 LV SV:         38 LV SV Index:   19 LVOT Area:     3.14 cm  LV Volumes (MOD) LV vol d, MOD A2C: 85.2 ml LV vol d, MOD A4C: 52.0 ml LV vol s, MOD A2C: 52.2 ml LV vol s, MOD A4C: 33.0 ml LV SV MOD A2C:     33.0 ml LV SV MOD A4C:     52.0 ml LV SV MOD BP:      30.6 ml RIGHT VENTRICLE RV Basal diam:  4.28 cm LEFT ATRIUM             Index       RIGHT ATRIUM           Index LA diam:        3.70 cm 1.85 cm/m  RA Area:     18.60 cm LA Vol (A2C):   52.6 ml 26.30 ml/m RA Volume:   52.20 ml  26.10 ml/m LA Vol (A4C):   24.7 ml 12.35 ml/m LA Biplane Vol: 38.6 ml 19.30 ml/m  AORTIC VALVE                   PULMONIC VALVE AV Area (Vmax):    2.00 cm    PV Vmax:        0.50 m/s AV Area (Vmean):   1.93 cm    PV Peak grad:   1.0 mmHg AV Area (VTI):     2.00 cm    RVOT Peak grad: 2 mmHg AV Vmax:           115.00  cm/s AV Vmean:          85.100 cm/s AV VTI:            0.192 m AV Peak Grad:      5.3 mmHg AV Mean Grad:      3.0 mmHg LVOT Vmax:         73.10 cm/s LVOT Vmean:        52.400 cm/s LVOT VTI:          0.122 m LVOT/AV VTI ratio: 0.64  AORTA Ao Root diam: 3.20 cm MITRAL VALVE               TRICUSPID VALVE MV Area (PHT): 3.97 cm    TR Peak grad:   40.7 mmHg MV Decel Time: 191 msec    TR Vmax:        319.00 cm/s MV E velocity: 71.60 cm/s MV A velocity: 84.40 cm/s  SHUNTS MV E/A ratio:  0.85        Systemic VTI:  0.12 m                            Systemic Diam: 2.00 cm Arnoldo Hooker MD Electronically signed by Arnoldo Hooker MD Signature Date/Time: 07/13/2020/1:02:19 PM    Final      Medications:   . sodium chloride 20 mL/hr  at 07/11/20 1400  . calcium gluconate 1,000 mg (07/14/20 1142)  . cefTRIAXone (ROCEPHIN)  IV Stopped (07/13/20 1822)  . doxycycline (VIBRAMYCIN) IV     . arformoterol  15 mcg Nebulization BID  . budesonide (PULMICORT) nebulizer solution  0.25 mg Nebulization BID  . chlorhexidine  15 mL Mouth Rinse BID  . Chlorhexidine Gluconate Cloth  6 each Topical Q0600  . mouth rinse  15 mL Mouth Rinse q12n4p  . methylPREDNISolone (SOLU-MEDROL) injection  40 mg Intravenous Daily  . metoprolol tartrate  5 mg Intravenous Q6H  . pantoprazole (PROTONIX) IV  40 mg Intravenous QHS  . thiamine injection  100 mg Intravenous Daily  . Treprostinil  18 mcg Inhalation QID   docusate sodium, polyethylene glycol  Assessment/ Plan:  64 y.o. female with  sarcoidosis, interstitial lung disease, obstructive sleep apnea, depression, GERD, hyperlipidemia, hypothyroidism   admitted on 07/09/2020 for Elevated LFTs [R79.89] Sepsis (HCC) [A41.9] Sepsis, due to unspecified organism, unspecified whether acute organ dysfunction present [facility]) [A41.9]  # AKI Baseline Cr 1.0 in May 2021 Admi Cr 4.16, peaked at 4.57 Urine output draining via F/C ,clear yellow  for the preceding 24 hours total of 2,825  ml  #Sepsis from urinary source Klebsiella pneumonia and blood in urine Patient has significant history of renal stones.  History of left ureteroscopy May 2021 US Renal on 07/13/20 IMPRESSION: 1. Unremarkable appearance of the kidneys. No hydronephrosis. 2. Small volume right upper quadrant ascites.   #History of pulmonary sarcoidosis, pulmonary hypertension Acute on chronic respiratory failure, community-acquired pneumonia  requires home oxygen and CPAP Currently on BIPAP Bronchodilators and Antibiotics per ICU team   LOS: 4 Keturah Yerby 9/28/20213:29 PM  [facility] [place], [place] [phone]  Note: This note was prepared with Dragon dictation. Any transcription errors are unintentional

## 2020-07-14 NOTE — Progress Notes (Signed)
ID BP 112/71 (BP Location: Left Arm)   Pulse 100   Temp (!) 103.6 F (39.8 C) (Rectal)   Resp 15   Ht _0  (1.702 m)   Wt 86.2 kg   SpO2 99%   BMI 29.76 kg/m   Pt intubated post cystoscopy and stent On precedex/fentanyl  O/e intubated/sedated cehst b/l air entry Hss1s2  tachy abd soft   CBC Latest Ref Rng & Units 07/14/2020 07/13/2020 07/12/2020  WBC 4.0 - 10.5 K/uL 18.6(H) 17.2(H) 33.4(H)  Hemoglobin 12.0 - 15.0 g/dL 12.7 12.1 12.0  Hematocrit 36 - 46 % 40.6 37.7 35.6(L)  Platelets 150 - 400 K/uL 39(L) 24(LL) 33(L)    CMP Latest Ref Rng & Units 07/14/2020 07/13/2020 07/12/2020  Glucose 70 - 99 mg/dL 188(H) 158(H) 164(H)  BUN 8 - 23 mg/dL 83(H) 88(H) 89(H)  Creatinine 0.44 - 1.00 mg/dL 2.46(H) 3.12(H) 3.75(H)  Sodium 135 - 145 mmol/L 150(H) 147(H) 143  Potassium 3.5 - 5.1 mmol/L 4.1 4.4 4.8  Chloride 98 - 111 mmol/L 110 107 103  CO2 22 - 32 mmol/L _1 Calcium 8.9 - 10.3 mg/dL 7.4(L) 6.6(L) 7.1(L)  Total Protein 6.5 - 8.1 g/dL 6.4(L) 6.0(L) 6.1(L)  Total Bilirubin 0.3 - 1.2 mg/dL 0.8 0.6 0.7  Alkaline Phos 38 - 126 U/L 101 93 102  AST 15 - 41 U/L 217(H) 145(H) 190(H)  ALT 0 - 44 U/L 304(H) 207(H) 211(H)    Impression/Recommendation ? Klebsiella bacteremia with Klebsiella UT- pretty pan sensitive kleb (except ampicillin) Continue ceftriaxone Underwent CT which obstructing ureteric stone rt side with hydro and pyelonephritis- was taken for cystscopy this evening and had b/l stent placement as left ureteric stone was seen on retrograde pyelogram  Encephalopathy due to infection/AKI   AKI- combination of ATN,dehydration , urate high  renal following  Severe thrombocytopenia-   Abnormal lfts- could be methotrexate, sepsis-  Left foot ecchymosis- recent trauma from car door  Pulmonary sarcoidosis - pn weekly methotrexate and prednisone CPAP and home oxygen  H/o left ureteric stone s/p lithotripsy and stent in May 2021 - stent removed after that H/o  Over active bladder, recurrent UTI  Discussed the management with care team

## 2020-07-14 NOTE — Op Note (Signed)
Date of procedure: 07/14/20  Preoperative diagnosis:  1. Bilateral ureteral stones 2. Sepsis from urinary source   Postoperative diagnosis:  1. Same  Procedure: 1. Cystoscopy 2. Right retrograde pyelogram with intraoperative interpretation, right ureteral stent placement 3. Left retrograde pyelogram with intraoperative interpretation, left ureteral stent placement  Surgeon: Nickolas Madrid, MD  Anesthesia: General  Complications: None  Intraoperative findings:  1. Erythema in bladder, no tumors or lesions 2. Right proximal ureteral stone and mild hydronephrosis seen on retrograde pyelogram, uncomplicated right stent placement, purulent drainage from right kidney 3. Left distal ureteral stone seen on retrograde with no hydronephrosis, uncomplicated left ureteral stent placement  EBL: None  Specimens: None  Drains: Bilateral 6Fx 26cm ureteral stents, 58F foley  Indication: Stacie [name] is a 64 y.o. patient who has been hospitalized for the last 4 days with klebsiella bacteremia secondary to UTI. CT performed this afternoon showed a right proximal ureteral stone with hydronephrosis, and a left distal ureteral stone with no hydronephrosis.  After reviewing the management options for treatment, they elected to proceed with the above surgical procedure(s). We have discussed the potential benefits and risks of the procedure, side effects of the proposed treatment, the likelihood of the patient achieving the goals of the procedure, and any potential problems that might occur during the procedure or recuperation. Informed consent has been obtained.  Description of procedure:  The patient was intubated in the ICU secondary to her critical status, and brought to the operating room. SCDs were placed for DVT prophylaxis. The patient was placed in the dorsal lithotomy position, prepped and draped in the usual sterile fashion, and preoperative antibiotics(ceftriaxone) were administered. A  preoperative time-out was performed.   A 21 French rigid cystoscope was used to intubate the urethra. There was some erythema throughout the bladder, but no tumors or lesions. I started by performing a retrograde pyelogram on the right side which showed a filling defect in the proximal ureter consistent with her known stone, and mild hydronephrosis. A sensor wire passed easily up into the kidney under fluoroscopic vision, and a 6 [place] by 26 cm ureteral stent was uneventfully placed with a curl in the renal pelvis, as well as under direct vision the bladder. There was purulent drainage from the right kidney.  I then turned my attention to the left side and a retrograde pyelogram was performed showing a filling defect in the left distal ureter, and no significant hydronephrosis. A wire passed easily alongside the stone up into the kidney under fluoroscopic vision. A 6 French by 26 cm ureteral stent was uneventfully placed on the left side with an excellent curl in the renal pelvis, as well as under direct vision the bladder.  An 33 French two-way Foley was placed to maximize drainage, and 10 cc were placed in the balloon.  Disposition: Stable to PACU  Plan: Remain intubated and return to ICU Continue antibiotics  Maintain Foley until clinically improved Will need bilateral outpatient ureteroscopy, laser lithotripsy, and stent exchange in 4 to 8 weeks when clinically improved  Nickolas Madrid, MD

## 2020-07-14 NOTE — Transfer of Care (Signed)
Immediate Anesthesia Transfer of Care Note  Patient: Stacie [name]  Procedure(s) Performed: CYSTOSCOPY WITH STENT PLACEMENT, RETROPYLOGRAM (Bilateral )  Patient Location: ICU  Anesthesia Type:General  Level of Consciousness: sedated  Airway & Oxygen Therapy: Patient remains intubated per anesthesia plan and Patient placed on Ventilator (see vital sign flow sheet for setting)  Post-op Assessment: Report given to RN and Post -op Vital signs reviewed and stable  Post vital signs: Reviewed and stable  Last Vitals:  Vitals Value Taken Time  BP 112/71 07/14/20 1828  Temp    Pulse 102 07/14/20 1829  Resp 14 07/14/20 1829  SpO2 99 % 07/14/20 1829  Vitals shown include unvalidated device data.  Last Pain:  Vitals:   07/14/20 1700  TempSrc: Rectal  PainSc:          Complications: No complications documented.

## 2020-07-14 NOTE — Anesthesia Preprocedure Evaluation (Signed)
Anesthesia Evaluation  Patient identified by MRN, date of birth, ID band Patient awake    Reviewed: Allergy & Precautions, H&P , NPO status , Patient's Chart, lab work & pertinent test results  Airway Mallampati: III  TM Distance: >3 FB Neck ROM: full    Dental  (+) Teeth Intact   Pulmonary shortness of breath, asthma , sleep apnea, Continuous Positive Airway Pressure Ventilation and Oxygen sleep apnea ,  Pulmonary sarcoidosis with pulmonary hypertension on treprostinil, currently on BiPap    + decreased breath sounds      Cardiovascular hypertension, (-) angina(-) Past MI Normal cardiovascular exam(-) dysrhythmias + Valvular Problems/Murmurs      Neuro/Psych PSYCHIATRIC DISORDERS Depression negative neurological ROS     GI/Hepatic Neg liver ROS, GERD  Controlled,  Endo/Other  Hypothyroidism   Renal/GU Renal disease (CRI)nephrolithiasis     Musculoskeletal   Abdominal   Peds  Hematology negative hematology ROS (+)   Anesthesia Other Findings Past Medical History: No date: Arrhythmia     Comment:  patient unaware if this is current No date: Asthma No date: GERD (gastroesophageal reflux disease) No date: Heart murmur No date: History of kidney stones No date: HOH (hard of hearing)     Comment:  wear aids No date: Hypothyroidism No date: IBS (irritable bowel syndrome) No date: Pulmonary hypertension (HCC) No date: Sarcoid No date: Sarcoidosis No date: Seasonal allergies CPAP with O2: Sleep apnea No date: Wears hearing aid in both ears  Past Surgical History: 10/18/2018: CARDIAC CATHETERIZATION     Comment:  [facility] 07/31/2019: CATARACT EXTRACTION W/PHACO; Left     Comment:  Procedure: CATARACT EXTRACTION PHACO AND INTRAOCULAR               LENS PLACEMENT (IOC) LEFT 00:51.1  17.9%  9.15;  Surgeon:              Leandrew Koyanagi, MD;  Location: [place];              Service: Ophthalmology;   Laterality: Left;  keep this               patient second No date: COLON SURGERY     Comment:  "colon was fused to bladder - operated on both" 09/18/2007: COLONOSCOPY     Comment:  diverticuli, no polyps 05/26/2010: COLONOSCOPY     Comment:  diverticuli, no polyps 05/20/2015: PARS PLANA VITRECTOMY; Right     Comment:  Procedure: PARS PLANA VITRECTOMY WITH 25 GAUGE, laser;                Surgeon: Milus Height, MD;  Location: [facility];                Service: Ophthalmology;  Laterality: Right; 1990: PARTIAL HYSTERECTOMY No date: TUBAL LIGATION  BMI    Body Mass Index: 28.89 kg/m      Reproductive/Obstetrics negative OB ROS                             Anesthesia Physical  Anesthesia Plan  ASA: III  Anesthesia Plan: General   Post-op Pain Management:    Induction: Intravenous and Rapid sequence  PONV Risk Score and Plan: Ondansetron, Dexamethasone and Treatment may vary due to age or medical condition  Airway Management Planned: Oral ETT  Additional Equipment:   Intra-op Plan:   Post-operative Plan: Extubation in OR and Post-operative intubation/ventilation  Informed Consent: I have reviewed the patients History  and Physical, chart, labs and discussed the procedure including the risks, benefits and alternatives for the proposed anesthesia with the patient or authorized representative who has indicated his/her understanding and acceptance.     Dental Advisory Given  Plan Discussed with: Anesthesiologist  Anesthesia Plan Comments: (Pt is currently on BiPap and will desaturate very quickly per Dr. Lanney Gins.  He stated that he was planning to intubate soon, so we will go ahead and do that prior to heading to the OR.)        Anesthesia Quick Evaluation

## 2020-07-14 NOTE — Progress Notes (Signed)
Pt transported to CT on bipap with no issues

## 2020-07-14 NOTE — Progress Notes (Signed)
PHARMACY CONSULT NOTE - FOLLOW UP  Pharmacy Consult for Electrolyte Monitoring and Replacement   Recent Labs: Potassium (mmol/L)  Date Value  07/14/2020 4.1  12/28/2012 3.4 (L)   Magnesium (mg/dL)  Date Value  07/14/2020 2.5 (H)   Calcium (mg/dL)  Date Value  07/14/2020 7.4 (L)   Calcium, Total (mg/dL)  Date Value  12/28/2012 8.0 (L)   Albumin (g/dL)  Date Value  07/14/2020 2.7 (L)  05/27/2019 4.1  12/26/2012 3.0 (L)   Phosphorus (mg/dL)  Date Value  07/14/2020 5.3 (H)   Sodium (mmol/L)  Date Value  07/14/2020 150 (H)  05/27/2019 144  12/28/2012 142  Corrected Calcium: 8.2   Assessment: Pharmacy consulted to replace electrolytes in this 64 year old female admitted with sepsis of unknown source.. Pt with PMH of Stage 3 CKD and baseline Scr of 1.0 in May 2021. Nephrology is following.  9/27 Electrolytes WNL, with sodium slightly elevated at 147, on NS at 75 mL/hr ordered to stop tonight at 2359 . Scr elevated but trending down (4.49>>3.75>>3.12) with good UOP yesterday.Pt placed on Calcium Gluconate 1g twice daily.  9/28: Sodium continues to trend up, at 150 today. Not on maintenance fluids. Calcium improving on calcium gluconate 1g twice daily. Kidney function improving with good UOP.   Goal of Therapy:  Electrolytes WNL   Plan:  -Continue calcium gluconate 1g twice daily. -No additional replacement needed at this time.  -Will recheck electrolytes with AM labs.   Jacobo Forest PharmD Candidate 2022 07/14/2020 11:50 AM

## 2020-07-14 NOTE — Anesthesia Procedure Notes (Signed)
Procedure Name: Intubation Date/Time: 07/14/2020 5:39 PM Performed by: Hedda Slade, CRNA Pre-anesthesia Checklist: Patient identified, Patient being monitored, Timeout performed, Emergency Drugs available and Suction available Patient Re-evaluated:Patient Re-evaluated prior to induction Oxygen Delivery Method: Circle system utilized Preoxygenation: Pre-oxygenation with 100% oxygen Induction Type: Rapid sequence, Cricoid Pressure applied and IV induction Laryngoscope Size: 3 and McGraph Grade View: Grade I Tube type: Oral Tube size: 7.0 mm Number of attempts: 1 Airway Equipment and Method: Stylet and Video-laryngoscopy Placement Confirmation: ETT inserted through vocal cords under direct vision,  positive ETCO2 and breath sounds checked- equal and bilateral Secured at: 21 cm Tube secured with: Tape Dental Injury: Teeth and Oropharynx as per pre-operative assessment

## 2020-07-14 NOTE — Progress Notes (Signed)
Pt placed on cooling blanket.

## 2020-07-14 NOTE — Consult Note (Signed)
Urology Consult   I have been asked to see the patient by Dr. Lanney Gins, for evaluation and management of sepsis from urinary source and right proximal ureteral stone.  HPI:  Stacie [name] is a 64 y.o. year old F with history of sarcoidosis, as well as history of left distal ureteral stone treated by Dr. Erlene Quan in May 2021. She has been admitted since 9/24 with Klebsiella bacteremia, UTI, possible pneumonia. She is currently in the ICU on BIPAP.  A CT performed today for worsening clinical status showed right 56m proximal ureteral stone with moderate hydronephrosis, and on my review there is also a 568mleft distal ureteral stone but no hydronephrosis.  Renal [place] showed no hydronephrosis.   History unable to be obtained from patient 2/2 clinical condition.  Discussed with daughter and Dr. AlLanney Ginsn ICU.  PMH: Past Medical History:  Diagnosis Date  . Arrhythmia    patient unaware if this is current  . Asthma   . GERD (gastroesophageal reflux disease)   . Heart murmur   . History of kidney stones   . HOH (hard of hearing)    wear aids  . Hypothyroidism   . IBS (irritable bowel syndrome)   . Pulmonary hypertension ([place]  . Sarcoid   . Sarcoidosis   . Seasonal allergies   . Sleep apnea CPAP with O2  . Wears hearing aid in both ears     Surgical History: Past Surgical History:  Procedure Laterality Date  . CARDIAC CATHETERIZATION  10/18/2018   [facility]  . CATARACT EXTRACTION W/PHACO Left 07/31/2019   Procedure: CATARACT EXTRACTION PHACO AND INTRAOCULAR LENS PLACEMENT (IOC) LEFT 00:51.1  17.9%  9.15;  Surgeon: BrLeandrew KoyanagiMD;  [place] [facility] Service: Ophthalmology;  Laterality: Left;  keep this patient second  . COLON SURGERY     "colon was fused to bladder - operated on both"  . COLONOSCOPY  09/18/2007   diverticuli, no polyps  . COLONOSCOPY  05/26/2010   diverticuli, no polyps  . CYSTOSCOPY/URETEROSCOPY/HOLMIUM LASER/STENT  PLACEMENT Left 02/20/2020   Procedure: CYSTOSCOPY/URETEROSCOPY/LITHOTRIPSY /STENT PLACEMENT;  Surgeon: BrHollice EspyMD;  Location: [facility];  Service: Urology;  Laterality: Left;  . PARS PLANA VITRECTOMY Right 05/20/2015   Procedure: PARS PLANA VITRECTOMY WITH 25 GAUGE, laser;  Surgeon: JeMilus HeightMD;  Location: [facility];  Service: Ophthalmology;  Laterality: Right;  . PARTIAL HYSTERECTOMY  1990  . TUBAL LIGATION      Allergies:  Allergies  Allergen Reactions  . Corn-Containing Products Diarrhea    headaches  . Nitrofurantoin Nausea Only  . Tramadol     Nausea and vomiting  . Sulfa Antibiotics Rash    As an infant    Family History: Family History  Problem Relation Age of Onset  . Allergies Father   . Asthma Father   . Colon cancer Father   . Allergies Brother   . Asthma Brother   . Breast cancer Maternal Grandmother     Social History:  reports that she has never smoked. She has never used smokeless tobacco. She reports that she does not drink alcohol and does not use drugs.  ROS: Negative aside from those stated in the HPI.  Physical Exam: BP (!) 138/125 (BP Location: Left Arm)   Pulse (!) 134   Temp (!) 103.9 F (39.9 C) (Rectal)   Resp (!) 27   Ht _0  (1.702 m)   Wt 86.2 kg   SpO2 91%  BMI 29.76 kg/m    Constitutional:  On BIPAP, critically ill Cardiovascular: tachycardic Respiratory: labored on bipap Foley with yellow urine Mottled extremities   Laboratory Data: Reviewed Klebsiella bacteremia WBC 18.6 sCr 2,46   Pertinent Imaging: I have personally reviewed the CT from today showing a right 56m proximal ureteral stone with moderate hydronephrosis, and on my review there is also a 516mleft distal ureteral stone but no hydronephrosis.   Assessment & Plan:   [age]o F with 4 day hospitalization for klebsiella bacteremia from urinary source, with CT today showing bilateral ureteral stones and right hydronephrosis.  We discussed the need for  drainage in the setting of an infected and obstructed system.  A ureteral stent is a small plastic tube that is placed cystoscopically with one end in the kidney and the other end in the bladder that allows the infection from the kidney to drain, and relieves pain from the obstructing stone.  We discussed the risks at length including bleeding, infection, sepsis, death, ureteral injury, and stent related symptoms including urgency/frequency/dysuria/flank pain/gross hematuria.  There is a low, but not 0, risk of inability to pass the ureteral stent alongside the stone from below which would require percutaneous nephrostomy tube by interventional radiology.  Finally, we discussed possible prolonged hospitalization and recovery, possible temporary Foley catheter placement, and 10 to 14-day course of antibiotics.  We reviewed the need for a follow-up procedure for definitive management of their stone when the infection has been treated in 2 to 3 weeks with bilateral ureteroscopy/laser lithotripsy, stent placement.  OR emergently tonight for cystoscopy and bilateral ureteral stent placement  Discussed with daughter and husband Dr. JuKathyrn Sheriff BrBil[facility]MD[facility] [id number] [id number]

## 2020-07-15 ENCOUNTER — Encounter: Payer: Self-pay | Admitting: Registered Nurse

## 2020-07-15 ENCOUNTER — Encounter: Payer: Self-pay | Admitting: Urology

## 2020-07-15 ENCOUNTER — Inpatient Hospital Stay: Payer: BC Managed Care – PPO

## 2020-07-15 LAB — BLOOD GAS, VENOUS
Acid-Base Excess: 4.5 mmol/L — ABNORMAL HIGH (ref 0.0–2.0)
Bicarbonate: 30.8 mmol/L — ABNORMAL HIGH (ref 20.0–28.0)
O2 Saturation: 97.1 %
Patient temperature: 37
pCO2, Ven: 52 mmHg (ref 44.0–60.0)
pH, Ven: 7.38 (ref 7.250–7.430)
pO2, Ven: 93 mmHg — ABNORMAL HIGH (ref 32.0–45.0)

## 2020-07-15 LAB — BLOOD GAS, ARTERIAL
Acid-Base Excess: 6.5 mmol/L — ABNORMAL HIGH (ref 0.0–2.0)
Bicarbonate: 33.5 mmol/L — ABNORMAL HIGH (ref 20.0–28.0)
Delivery systems: POSITIVE
Expiratory PAP: 6
FIO2: 0.4
Inspiratory PAP: 8
O2 Saturation: 99.3 %
Patient temperature: 37
pCO2 arterial: 58 mmHg — ABNORMAL HIGH (ref 32.0–48.0)
pH, Arterial: 7.37 (ref 7.350–7.450)
pO2, Arterial: 152 mmHg — ABNORMAL HIGH (ref 83.0–108.0)

## 2020-07-15 LAB — GLUCOSE, CAPILLARY
Glucose-Capillary: 182 mg/dL — ABNORMAL HIGH (ref 70–99)
Glucose-Capillary: 172 mg/dL — ABNORMAL HIGH (ref 70–99)
Glucose-Capillary: 182 mg/dL — ABNORMAL HIGH (ref 70–99)
Glucose-Capillary: 201 mg/dL — ABNORMAL HIGH (ref 70–99)
Glucose-Capillary: 233 mg/dL — ABNORMAL HIGH (ref 70–99)

## 2020-07-15 LAB — CBC WITH DIFFERENTIAL/PLATELET
Abs Immature Granulocytes: 0.33 10*3/uL — ABNORMAL HIGH (ref 0.00–0.07)
Basophils Absolute: 0.2 10*3/uL — ABNORMAL HIGH (ref 0.0–0.1)
Basophils Relative: 1 %
Eosinophils Absolute: 0 10*3/uL (ref 0.0–0.5)
Eosinophils Relative: 0 %
HCT: 42.6 % (ref 36.0–46.0)
Hemoglobin: 13.1 g/dL (ref 12.0–15.0)
Immature Granulocytes: 2 %
Lymphocytes Relative: 4 %
Lymphs Abs: 0.6 10*3/uL — ABNORMAL LOW (ref 0.7–4.0)
MCH: 30.5 pg (ref 26.0–34.0)
MCHC: 30.8 g/dL (ref 30.0–36.0)
MCV: 99.1 fL (ref 80.0–100.0)
Monocytes Absolute: 1.1 10*3/uL — ABNORMAL HIGH (ref 0.1–1.0)
Monocytes Relative: 8 %
Neutro Abs: 11.5 10*3/uL — ABNORMAL HIGH (ref 1.7–7.7)
Neutrophils Relative %: 85 %
Platelets: 64 10*3/uL — ABNORMAL LOW (ref 150–400)
RBC: 4.3 MIL/uL (ref 3.87–5.11)
RDW: 14.6 % (ref 11.5–15.5)
Smear Review: NORMAL
WBC: 13.7 10*3/uL — ABNORMAL HIGH (ref 4.0–10.5)
nRBC: 0.7 % — ABNORMAL HIGH (ref 0.0–0.2)

## 2020-07-15 LAB — BASIC METABOLIC PANEL
Anion gap: 11 (ref 5–15)
Anion gap: 12 (ref 5–15)
BUN: 76 mg/dL — ABNORMAL HIGH (ref 8–23)
BUN: 79 mg/dL — ABNORMAL HIGH (ref 8–23)
CO2: 31 mmol/L (ref 22–32)
CO2: 29 mmol/L (ref 22–32)
Calcium: 7.6 mg/dL — ABNORMAL LOW (ref 8.9–10.3)
Calcium: 7.7 mg/dL — ABNORMAL LOW (ref 8.9–10.3)
Chloride: 114 mmol/L — ABNORMAL HIGH (ref 98–111)
Chloride: 115 mmol/L — ABNORMAL HIGH (ref 98–111)
Creatinine, Ser: 2.35 mg/dL — ABNORMAL HIGH (ref 0.44–1.00)
Creatinine, Ser: 2.18 mg/dL — ABNORMAL HIGH (ref 0.44–1.00)
GFR calc Af Amer: 24 mL/min — ABNORMAL LOW (ref >60)
GFR calc Af Amer: 27 mL/min — ABNORMAL LOW (ref >60)
GFR calc non Af Amer: 21 mL/min — ABNORMAL LOW (ref >60)
GFR calc non Af Amer: 23 mL/min — ABNORMAL LOW (ref >60)
Glucose, Bld: 212 mg/dL — ABNORMAL HIGH (ref 70–99)
Glucose, Bld: 202 mg/dL — ABNORMAL HIGH (ref 70–99)
Potassium: 4.5 mmol/L (ref 3.5–5.1)
Potassium: 4.5 mmol/L (ref 3.5–5.1)
Sodium: 156 mmol/L — ABNORMAL HIGH (ref 135–145)
Sodium: 156 mmol/L — ABNORMAL HIGH (ref 135–145)

## 2020-07-15 LAB — PHOSPHORUS: Phosphorus: 5.8 mg/dL — ABNORMAL HIGH (ref 2.5–4.6)

## 2020-07-15 LAB — HEPATITIS PANEL, ACUTE
HCV Ab: NONREACTIVE
Hep A IgM: NONREACTIVE
Hep B C IgM: NONREACTIVE
Hepatitis B Surface Ag: NONREACTIVE

## 2020-07-15 LAB — MAGNESIUM: Magnesium: 2.5 mg/dL — ABNORMAL HIGH (ref 1.7–2.4)

## 2020-07-15 MED ORDER — INSULIN ASPART 100 UNIT/ML ~~LOC~~ SOLN
0.0000 [IU] | SUBCUTANEOUS | Status: DC
  Administered 2020-07-15 – 2020-07-16 (×5): 3 [IU] via SUBCUTANEOUS
  Administered 2020-07-16: 2 [IU] via SUBCUTANEOUS
  Administered 2020-07-16: 3 [IU] via SUBCUTANEOUS
  Administered 2020-07-17: 1 [IU] via SUBCUTANEOUS
  Administered 2020-07-17: 5 [IU] via SUBCUTANEOUS
  Filled 2020-07-15 (×9): qty 1

## 2020-07-15 MED ORDER — DEXTROSE 5 % IV SOLN
INTRAVENOUS | Status: DC
  Administered 2020-07-17: 1000 mL via INTRAVENOUS

## 2020-07-15 MED ORDER — DEXAMETHASONE SODIUM PHOSPHATE 10 MG/ML IJ SOLN
10.0000 mg | Freq: Once | INTRAMUSCULAR | Status: AC
  Administered 2020-07-15: 10 mg via INTRAVENOUS
  Filled 2020-07-15: qty 1

## 2020-07-15 MED ORDER — PHENYLEPHRINE HCL-NACL 10-0.9 MG/250ML-% IV SOLN
25.0000 ug/min | INTRAVENOUS | Status: DC
  Administered 2020-07-15: 25 ug/min via INTRAVENOUS
  Filled 2020-07-15: qty 250

## 2020-07-15 MED ORDER — ACETAMINOPHEN 650 MG RE SUPP
650.0000 mg | RECTAL | Status: DC | PRN
  Administered 2020-07-15: 650 mg via RECTAL
  Filled 2020-07-15: qty 1

## 2020-07-15 NOTE — Anesthesia Postprocedure Evaluation (Signed)
Anesthesia Post Note  Patient: Stacie [name]  Procedure(s) Performed: CYSTOSCOPY WITH STENT PLACEMENT, RETROPYLOGRAM (Bilateral )  Patient location during evaluation: SICU Anesthesia Type: General Level of consciousness: sedated Pain management: pain level controlled Vital Signs Assessment: post-procedure vital signs reviewed and stable Respiratory status: patient remains intubated per anesthesia plan Cardiovascular status: stable Postop Assessment: no apparent nausea or vomiting Anesthetic complications: no   No complications documented.   Last Vitals:  Vitals:   07/15/20 0600 07/15/20 0700  BP: (!) 101/53 (!) 114/57  Pulse: (!) 57 (!) 57  Resp: 18 18  Temp: 37.6 C   SpO2: 100% 96%    Last Pain:  Vitals:   07/15/20 0600  TempSrc: Rectal  PainSc:                  Caryl Asp

## 2020-07-15 NOTE — Progress Notes (Signed)
Order to extubate to BIPAP per MD.  Cuff leak noted.  Patient extubated to BIPAP at this time.

## 2020-07-15 NOTE — Progress Notes (Signed)
   Subjective Fever curve improving Currently being extubated this morning  Physical Exam: BP 132/69   Pulse 96   Temp 98.4 F (36.9 C) (Rectal)   Resp 13   Ht 5' 7.01" (1.702 m)   Wt 88.7 kg   SpO2 100%   BMI 30.62 kg/m    Constitutional: Currently being extubated Drains: Foley with yellow/pink urine  Laboratory Data: Reviewed in epic, leukocytosis downtrending and renal function improving   Assessment & Plan:   64 year old female who remains critically ill in the ICU, now POD#1 from bilateral ureteral stent placement for bilateral ureteral stones with Klebsiella sepsis from urinary source.  -Appreciate excellent ICU care -Foley duration per ICU team -Will arrange outpatient urology follow-up in 2 to 3 weeks to discuss need for bilateral ureteroscopy, laser lithotripsy, stent exchange when clinically improved   Billey Co, MD

## 2020-07-15 NOTE — Progress Notes (Signed)
PHARMACY CONSULT NOTE - FOLLOW UP  Pharmacy Consult for Electrolyte Monitoring and Replacement   Recent Labs: Potassium (mmol/L)  Date Value  07/15/2020 4.5  12/28/2012 3.4 (L)   Magnesium (mg/dL)  Date Value  07/15/2020 2.5 (H)   Calcium (mg/dL)  Date Value  07/15/2020 7.6 (L)   Calcium, Total (mg/dL)  Date Value  12/28/2012 8.0 (L)   Albumin (g/dL)  Date Value  07/14/2020 2.7 (L)  05/27/2019 4.1  12/26/2012 3.0 (L)   Phosphorus (mg/dL)  Date Value  07/15/2020 5.8 (H)   Sodium (mmol/L)  Date Value  07/15/2020 156 (H)  05/27/2019 144  12/28/2012 142  Corrected Calcium: 8.2   Assessment: Pharmacy consulted to replace electrolytes in this 64 year old female admitted with sepsis of unknown source.. Pt with PMH of Stage 3 CKD and baseline Scr of 1.0 in May 2021. Nephrology is following.  9/27 Electrolytes WNL, with sodium slightly elevated at 147, on NS at 75 mL/hr ordered to stop tonight at 2359 . Scr elevated but trending down (4.49>>3.75>>3.12) with good UOP yesterday.Pt placed on Calcium Gluconate 1g twice daily.  9/28: Sodium continues to trend up, at 150 today. Not on maintenance fluids. Calcium improving on calcium gluconate 1g twice daily. Kidney function improving with good UOP.   Goal of Therapy:  Electrolytes WNL   Plan:  D5 at 40 ml/hr added for hypernatremia, Na 150>156. Continue to monitor daily.  Tawnya Crook, PharmD Clinical Pharmacist 07/15/2020 12:22 PM

## 2020-07-15 NOTE — Progress Notes (Signed)
ID BP 134/75   Pulse 90   Temp 98.7 F (37.1 C) (Rectal)   Resp 20   Ht 5' 7.01" (1.702 m)   Wt 88.7 kg   SpO2 100%   BMI 30.62 kg/m     Had fever last night and early this morning  Extubated On BIPAP Chest b/l air entry  CBC Latest Ref Rng & Units 07/15/2020 07/14/2020 07/13/2020  WBC 4.0 - 10.5 K/uL 13.7(H) 18.6(H) 17.2(H)  Hemoglobin 12.0 - 15.0 g/dL 13.1 12.7 11.7  Hematocrit 36 - 46 % 42.6 40.6 37.7  Platelets 150 - 400 K/uL 64(L) 39(L) 24(LL)    CMP Latest Ref Rng & Units 07/15/2020 07/15/2020 07/14/2020  Glucose 70 - 99 mg/dL 202(H) 212(H) 188(H)  BUN 8 - 23 mg/dL 79(H) 76(H) 83(H)  Creatinine 0.44 - 1.00 mg/dL 2.18(H) 2.35(H) 2.46(H)  Sodium 135 - 145 mmol/L 156(H) 156(H) 150(H)  Potassium 3.5 - 5.1 mmol/L 4.5 4.5 4.1  Chloride 98 - 111 mmol/L 115(H) 114(H) 110  CO2 22 - 32 mmol/L _0 Calcium 8.9 - 10.3 mg/dL 7.7(L) 7.6(L) 7.4(L)  Total Protein 6.5 - 8.1 g/dL - - 6.4(L)  Total Bilirubin 0.3 - 1.2 mg/dL - - 0.8  Alkaline Phos 38 - 126 U/L - - 101  AST 15 - 41 U/L - - 217(H)  ALT 0 - 44 U/L - - 304(H)   Impression/recommendation Klebsiella bacteremia with Klebsiella UT-  pan sensitive kleb (except ampicillin) Continue ceftriaxone obstructing ureteric stone rt side with hydro and pyelonephritis- underwent  b/l stent placement on 07/14/20 as left ureteric stone was seen on retrograde pyelogram  Encephalopathy due to infection/AKI   AKI- combination of ATN,dehydration , improving renal following  Severe thrombocytopenia- likely due to sepsis- improving  Abnormal lfts- could be methotrexate, sepsis-  Left foot ecchymosis- recent trauma from car door  Pulmonary sarcoidosis - pn weekly methotrexate and prednisone CPAP and home oxygen  H/o left ureteric stone s/p lithotripsy and stent in May 2021 - stent removed after that H/o Over active bladder, recurrent UTI Discussed the management with the care team

## 2020-07-15 NOTE — Progress Notes (Signed)
[place], [place] 07/15/20  Subjective:   Hospital day # 5  Patient intubated,mechanically ventilated with FiO2 35%. F/C draining clear yellow output UOP for the preceding 24 hours is 1850 ml    Renal: 09/28 0701 - 09/29 0700 In: 1677.1 [I.V.:1174.6; IV Piggyback:502.5] Out: 1850 [Urine:1850] Lab Results  Component Value Date   CREATININE 2.18 (H) 07/15/2020   CREATININE 2.35 (H) 07/15/2020   CREATININE 2.46 (H) 07/14/2020     Objective:  Vital signs in last 24 hours:  Temp:  [97.8 F (36.6 C)-103.9 F (39.9 C)] 98.4 F (36.9 C) (09/29 1200) Pulse Rate:  [50-134] 83 (09/29 1400) Resp:  [11-32] 17 (09/29 1400) BP: (73-158)/(50-125) 127/68 (09/29 1400) SpO2:  [90 %-100 %] 100 % (09/29 1400) FiO2 (%):  [30 %-40 %] 35 % (09/29 1212) Weight:  [88.7 kg] 88.7 kg (09/29 0432)  Weight change: 2.5 kg Filed Weights   07/13/20 0500 07/14/20 0500 07/15/20 0432  Weight: 88.4 kg 86.2 kg 88.7 kg    Intake/Output:    Intake/Output Summary (Last 24 hours) at 07/15/2020 1436 Last data filed at 07/15/2020 1204 Gross per 24 hour  Intake 2069.26 ml  Output 725 ml  Net 1344.26 ml     Physical Exam: General:  Intubated,sedated  HEENT ETT in place  Pulm/lungs  coarse crackles bilaterally  CVS/Heart  S1S2 tachycardic  Abdomen:   Soft, nondistended  Extremities:  No peripheral edema  Neurologic:  sedated  Skin:  Cool, purple toes 1,2 on right, left dorsal chronic purple lesion    Basic Metabolic Panel:  Recent Labs  Lab 07/10/20 1418 07/10/20 2259 07/11/20 0445 07/11/20 0445 07/12/20 0735 07/12/20 0735 07/13/20 0316 07/13/20 0316 07/14/20 0404 07/15/20 0433 07/15/20 1343  NA 141   < > 142   < > 143  --  147*  --  150* 156* 156*  K 5.2*   < > 4.7   < > 4.8  --  4.4  --  4.1 4.5 4.5  CL 106   < > 103   < > 103  --  107  --  110 114* 115*  CO2 20*   < > 21*   < > 24  --  27  --  _0 GLUCOSE 91   < > 128*   < > 164*  --  158*  --   188* 212* 202*  BUN 49*   < > 66*   < > 89*  --  88*  --  83* 76* 79*  CREATININE 4.22*   < > 4.49*   < > 3.75*  --  3.12*  --  2.46* 2.35* 2.18*  CALCIUM 7.0*   < > 7.2*   < > 7.1*   < > 6.6*   < > 7.4* 7.6* 7.7*  MG  --   --  2.8*  --  2.8*  --  2.6*  --  2.5* 2.5*  --   PHOS 6.5*  --   --   --  6.8*  --  4.3  --  5.3* 5.8*  --    < > = values in this interval not displayed.     CBC: Recent Labs  Lab 07/11/20 0445 07/11/20 0445 07/12/20 0735 07/13/20 0316 07/13/20 1301 07/14/20 0404 07/15/20 0433  WBC 26.9*  --  33.4* 17.2*  --  18.6* 13.7*  NEUTROABS 21.9*  --  31.5* 15.1*  --  16.4* 11.5*  HGB 11.9*   < >  12.0 12.1 11.7 12.7 13.1  HCT 35.8*  --  35.6* 37.7  --  40.6 42.6  MCV 94.0  --  92.2 95.7  --  96.9 99.1  PLT 67*  --  33* 24*  --  39* 64*   < > = values in this interval not displayed.      Lab Results  Component Value Date   HEPBSAG NON REACTIVE 07/15/2020   HEPBIGM NON REACTIVE 07/15/2020      Microbiology:  Recent Results (from the past 240 hour(s))  Urine culture     Status: Abnormal   Collection Time: 07/09/20 11:27 PM   Specimen: In/Out Cath Urine  Result Value Ref Range Status   Specimen Description   Final    IN/OUT CATH URINE Performed at [facility], [address] [place], [place] [address]    Special Requests   Final    NONE Performed at [facility], [place], [place] [address]    Culture >=100,000 COLONIES/mL KLEBSIELLA PNEUMONIAE (A)  Final   Report Status 07/12/2020 FINAL  Final   Organism ID, Bacteria KLEBSIELLA PNEUMONIAE (A)  Final      Susceptibility   Klebsiella pneumoniae - MIC*    AMPICILLIN RESISTANT Resistant     CEFAZOLIN <=4 SENSITIVE Sensitive     CEFTRIAXONE <=0.25 SENSITIVE Sensitive     CIPROFLOXACIN <=0.25 SENSITIVE Sensitive     GENTAMICIN <=1 SENSITIVE Sensitive     IMIPENEM <=0.25 SENSITIVE Sensitive     NITROFURANTOIN <=16 SENSITIVE Sensitive     TRIMETH/SULFA <=20  SENSITIVE Sensitive     AMPICILLIN/SULBACTAM <=2 SENSITIVE Sensitive     PIP/TAZO <=4 SENSITIVE Sensitive     * >=100,000 COLONIES/mL KLEBSIELLA PNEUMONIAE  Respiratory Panel by RT PCR (Flu A&B, Covid) - Nasopharyngeal Swab     Status: None   Collection Time: 07/09/20 11:28 PM   Specimen: Nasopharyngeal Swab  Result Value Ref Range Status   SARS Coronavirus 2 by RT PCR NEGATIVE NEGATIVE Final    Comment: (NOTE) SARS-CoV-2 target nucleic acids are NOT DETECTED.  The SARS-CoV-2 RNA is generally detectable in upper respiratoy specimens during the acute phase of infection. The lowest concentration of SARS-CoV-2 viral copies this assay can detect is 131 copies/mL. A negative result does not preclude SARS-Cov-2 infection and should not be used as the sole basis for treatment or other patient management decisions. A negative result may occur with  improper specimen collection/handling, submission of specimen other than nasopharyngeal swab, presence of viral mutation(s) within the areas targeted by this assay, and inadequate number of viral copies (<131 copies/mL). A negative result must be combined with clinical observations, patient history, and epidemiological information. The expected result is Negative.  Fact Sheet for Patients:  PinkCheek.be  Fact Sheet for Healthcare Providers:  GravelBags.it  This test is no t yet approved or cleared by the [place] FDA and  has been authorized for detection and/or diagnosis of SARS-CoV-2 by FDA under an Emergency Use Authorization (EUA). This EUA will remain  in effect (meaning this test can be used) for the duration of the COVID-19 declaration under Section 564(b)(1) of the Act, 21 U.S.C. section 360bbb-3(b)(1), unless the authorization is terminated or revoked sooner.     Influenza A by PCR NEGATIVE NEGATIVE Final   Influenza B by PCR NEGATIVE NEGATIVE Final    Comment: (NOTE)  The Xpert Xpress SARS-CoV-2/FLU/RSV assay is intended as an aid in  the diagnosis of influenza from Nasopharyngeal swab specimens  and  should not be used as a sole basis for treatment. Nasal washings and  aspirates are unacceptable for Xpert Xpress SARS-CoV-2/FLU/RSV  testing.  Fact Sheet for Patients: PinkCheek.be  Fact Sheet for Healthcare Providers: GravelBags.it  This test is not yet approved or cleared by the [place] FDA and  has been authorized for detection and/or diagnosis of SARS-CoV-2 by  FDA under an Emergency Use Authorization (EUA). This EUA will remain  in effect (meaning this test can be used) for the duration of the  Covid-19 declaration under Section 564(b)(1) of the Act, 21  U.S.C. section 360bbb-3(b)(1), unless the authorization is  terminated or revoked. Performed at [facility], [address] [place], [place] [address]   Blood Culture (routine x 2)     Status: Abnormal   Collection Time: 07/09/20 11:36 PM   Specimen: BLOOD  Result Value Ref Range Status   Specimen Description   Final    BLOOD RIGHT ANTECUBITAL Performed at [facility], [address] [place], [place] [address]    Special Requests   Final    BOTTLES DRAWN AEROBIC AND ANAEROBIC Blood Culture adequate volume Performed at [facility], [address] [place], [place] [address]    Culture  Setup Time   Final    Organism ID to follow GRAM NEGATIVE RODS IN BOTH AEROBIC AND ANAEROBIC BOTTLES CRITICAL RESULT CALLED TO, READ BACK BY AND VERIFIED WITH: AMY THOMPSON AT 1220 07/10/20 SDR Performed at [facility], [address] [place], [place] [address]    Culture KLEBSIELLA PNEUMONIAE (A)  Final   Report Status 07/12/2020 FINAL  Final   Organism ID, Bacteria KLEBSIELLA PNEUMONIAE  Final      Susceptibility   Klebsiella pneumoniae - MIC*    AMPICILLIN RESISTANT Resistant     CEFAZOLIN  <=4 SENSITIVE Sensitive     CEFEPIME <=0.12 SENSITIVE Sensitive     CEFTAZIDIME <=1 SENSITIVE Sensitive     CEFTRIAXONE <=0.25 SENSITIVE Sensitive     CIPROFLOXACIN <=0.25 SENSITIVE Sensitive     GENTAMICIN <=1 SENSITIVE Sensitive     IMIPENEM <=0.25 SENSITIVE Sensitive     TRIMETH/SULFA <=20 SENSITIVE Sensitive     AMPICILLIN/SULBACTAM <=2 SENSITIVE Sensitive     PIP/TAZO <=4 SENSITIVE Sensitive     * KLEBSIELLA PNEUMONIAE  Blood Culture ID Panel (Reflexed)     Status: Abnormal   Collection Time: 07/09/20 11:36 PM  Result Value Ref Range Status   Enterococcus faecalis NOT DETECTED NOT DETECTED Final   Enterococcus Faecium NOT DETECTED NOT DETECTED Final   Listeria monocytogenes NOT DETECTED NOT DETECTED Final   Staphylococcus species NOT DETECTED NOT DETECTED Final   Staphylococcus aureus (BCID) NOT DETECTED NOT DETECTED Final   Staphylococcus epidermidis NOT DETECTED NOT DETECTED Final   Staphylococcus lugdunensis NOT DETECTED NOT DETECTED Final   Streptococcus species NOT DETECTED NOT DETECTED Final   Streptococcus agalactiae NOT DETECTED NOT DETECTED Final   Streptococcus pneumoniae NOT DETECTED NOT DETECTED Final   Streptococcus pyogenes NOT DETECTED NOT DETECTED Final   A.calcoaceticus-baumannii NOT DETECTED NOT DETECTED Final   Bacteroides fragilis NOT DETECTED NOT DETECTED Final   Enterobacterales DETECTED (A) NOT DETECTED Final    Comment: Enterobacterales represent a large order of gram negative bacteria, not a single organism. CRITICAL RESULT CALLED TO, READ BACK BY AND VERIFIED WITH:  AMY THOMPSON AT 4401 04/09/20 SDR    Enterobacter cloacae complex NOT DETECTED NOT DETECTED Final   Escherichia coli NOT DETECTED NOT DETECTED  Final   Klebsiella aerogenes NOT DETECTED NOT DETECTED Final   Klebsiella oxytoca NOT DETECTED NOT DETECTED Final   Klebsiella pneumoniae DETECTED (A) NOT DETECTED Final    Comment: CRITICAL RESULT CALLED TO, READ BACK BY AND VERIFIED WITH:  AMY  THOMPSON AT 1220 07/10/20 SDR    Proteus species NOT DETECTED NOT DETECTED Final   Salmonella species NOT DETECTED NOT DETECTED Final   Serratia marcescens NOT DETECTED NOT DETECTED Final   Haemophilus influenzae NOT DETECTED NOT DETECTED Final   Neisseria meningitidis NOT DETECTED NOT DETECTED Final   Pseudomonas aeruginosa NOT DETECTED NOT DETECTED Final   Stenotrophomonas maltophilia NOT DETECTED NOT DETECTED Final   Candida albicans NOT DETECTED NOT DETECTED Final   Candida auris NOT DETECTED NOT DETECTED Final   Candida glabrata NOT DETECTED NOT DETECTED Final   Candida krusei NOT DETECTED NOT DETECTED Final   Candida parapsilosis NOT DETECTED NOT DETECTED Final   Candida tropicalis NOT DETECTED NOT DETECTED Final   Cryptococcus neoformans/gattii NOT DETECTED NOT DETECTED Final   CTX-M ESBL NOT DETECTED NOT DETECTED Final   Carbapenem resistance IMP NOT DETECTED NOT DETECTED Final   Carbapenem resistance KPC NOT DETECTED NOT DETECTED Final   Carbapenem resistance NDM NOT DETECTED NOT DETECTED Final   Carbapenem resist OXA 48 LIKE NOT DETECTED NOT DETECTED Final   Carbapenem resistance VIM NOT DETECTED NOT DETECTED Final    Comment: Performed at [facility], [address] [place], [place] [address]  Blood Culture (routine x 2)     Status: Abnormal   Collection Time: 07/09/20 11:37 PM   Specimen: BLOOD  Result Value Ref Range Status   Specimen Description   Final    BLOOD BLOOD RIGHT FOREARM Performed at [facility], [address], [place], [place] [address]    Special Requests   Final    BOTTLES DRAWN AEROBIC AND ANAEROBIC Blood Culture adequate volume Performed at [facility], [address] [place], [place] [address]    Culture  Setup Time   Final    GRAM NEGATIVE RODS IN BOTH AEROBIC AND ANAEROBIC BOTTLES CRITICAL RESULT CALLED TO, READ BACK BY AND VERIFIED WITH: AMY THOMPSON AT 1220 07/10/20 SDR Performed at [facility],  [address] [place], [place] [address]    Culture (A)  Final    KLEBSIELLA PNEUMONIAE SUSCEPTIBILITIES PERFORMED ON PREVIOUS CULTURE WITHIN THE LAST 5 DAYS. Performed at [facility], [place] [address] [place], [place] [address]    Report Status 07/12/2020 FINAL  Final  Aspergillus Ag, BAL/Serum     Status: None   Collection Time: 07/10/20  5:04 AM   Specimen: Blood  Result Value Ref Range Status   Aspergillus Ag, BAL/Serum 0.03 0.00 - 0.49 Index Final    Comment: (NOTE) Performed At: [facility] [place], [place] [id number] [address] Stacie [name] WH:[id number]   MRSA PCR Screening     Status: None   Collection Time: 07/10/20 12:54 PM   Specimen: Nasal Mucosa; Nasopharyngeal  Result Value Ref Range Status   MRSA by PCR NEGATIVE NEGATIVE Final    Comment:        The GeneXpert MRSA Assay (FDA approved for NASAL specimens only), is one component of a comprehensive MRSA colonization surveillance program. It is not intended to diagnose MRSA infection nor to guide or monitor treatment for MRSA infections. Performed at [facility], [address] [place], [place] [address]     Coagulation Studies: Recent  Labs    07/13/20 1158 07/14/20 1334  LABPROT 16.9* 15.8*  INR 1.4* 1.3*    Urinalysis: No results for input(s): COLORURINE, LABSPEC, PHURINE, GLUCOSEU, HGBUR, BILIRUBINUR, KETONESUR, PROTEINUR, UROBILINOGEN, NITRITE, LEUKOCYTESUR in the last 72 hours.  Invalid input(s): APPERANCEUR    Imaging: CT ABDOMEN PELVIS WO CONTRAST  Result Date: 07/14/2020 CLINICAL DATA:  Worsening bacteremia and fevers, initial encounter EXAM: CT ABDOMEN AND PELVIS WITHOUT CONTRAST TECHNIQUE: Multidetector CT imaging of the abdomen and pelvis was performed following the standard protocol without IV contrast. COMPARISON:  Ultrasound from the previous day, CT from 07/10/2020. FINDINGS: Lower chest: Some fibrotic changes are noted in the bases bilaterally  consistent with the patient's given clinical history of sarcoidosis. Small pleural effusions are noted bilaterally new from the prior exam. Hepatobiliary: Liver and gallbladder are within normal limits. Minimal perihepatic fluid is noted. Pancreas: Unremarkable. No pancreatic ductal dilatation or surrounding inflammatory changes. Spleen: Normal in size without focal abnormality. Adrenals/Urinary Tract: Adrenal glands are within normal limits. Left kidney shows no renal calculi or obstructive changes. The left ureter is within normal limits. The bladder is decompressed by Foley catheter. Right kidney demonstrates significant perinephric stranding with mild hydronephrosis and proximal hydroureter. Tiny nonobstructing stone is noted in the lower pole. In the proximal right ureter there is a 7-8 mm stone identified causing the obstructive change as well as the perinephric stranding. These inflammatory changes surround the ureter throughout its course. The more distal ureter shows no obstructive change. Stomach/Bowel: No obstructive or inflammatory changes of the colon are seen. The appendix is within normal limits. No inflammatory changes are seen. Small bowel and stomach are unremarkable with the exception of a small duodenal diverticulum adjacent to the head of the pancreas. Vascular/Lymphatic: Aortic atherosclerosis. No enlarged abdominal or pelvic lymph nodes. Reproductive: Status post hysterectomy. No adnexal masses. Other: Mild free fluid is noted within the abdomen. Musculoskeletal: Degenerative changes of lumbar spine are noted. IMPRESSION: 7-8 mm proximal right ureteral stone with obstructive change and perinephric stranding. Minimal free fluid within the abdomen and pelvis. Chronic changes in the lung bases consistent with the given clinical history of sarcoidosis. Electronically Signed   By: Inez Catalina M.D.   On: 07/14/2020 16:08   DG Abd 1 View  Result Date: 07/15/2020 CLINICAL DATA:  Abdominal  distension. EXAM: ABDOMEN - 1 VIEW COMPARISON:  November 18, 2014. FINDINGS: The bowel gas pattern is normal. Bilateral ureteral stents are noted. Surgical clips are seen overlying the sacrum. No radio-opaque calculi or other significant radiographic abnormality are seen. IMPRESSION: No evidence of bowel obstruction or ileus. Electronically Signed   By: Marijo Conception M.D.   On: 07/15/2020 08:28   US ARTERIAL LOWER EXTREMITY DUPLEX BILATERAL  Result Date: 07/15/2020 CLINICAL DATA:  64 year old female with history of critical limb ischemia. EXAM: BILATERAL LOWER EXTREMITY ARTERIAL DUPLEX SCAN TECHNIQUE: Gray-scale sonography as well as color Doppler and duplex ultrasound was performed to evaluate the arteries of both lower extremities including the common, superficial and profunda femoral arteries, popliteal artery and calf arteries. COMPARISON:  None. FINDINGS: Right Lower Extremity ABI: Not obtained. Inflow: Normal common femoral arterial waveforms and velocities. No evidence of inflow (aortoiliac) disease. Outflow: Normal profunda femoral, superficial femoral and popliteal arterial waveforms and velocities. No focal elevation of the PSV to suggest stenosis. Runoff: Normal posterior and anterior tibial arterial waveforms and velocities. Vessels are patent to the ankle. Left Lower Extremity ABI: Not obtained. Inflow: Normal common femoral arterial waveforms and velocities. No evidence  of inflow (aortoiliac) disease. Outflow: Normal profunda femoral, superficial femoral and popliteal arterial waveforms and velocities. No focal elevation of the PSV to suggest stenosis. Runoff: Normal posterior and anterior tibial arterial waveforms and velocities. Vessels are patent to the ankle. IMPRESSION: Patent bilateral lower extremity arteries with normal waveforms and no evidence of significant atherosclerosis or flow limiting stenosis. Stacie Cancer, Stac[facility] [name]  Electronically Signed   By: Stacie Cancer Stacie [name]   On: 07/15/2020 07:42   DG Chest Port 1 View  Result Date: 07/14/2020 CLINICAL DATA:  Intubated EXAM: PORTABLE CHEST 1 VIEW COMPARISON:  07/14/2020, 07/13/2020, CT chest 07/10/2020 FINDINGS: Endotracheal tube tip is about 3 cm superior to the carina. Treated compression deformities of the spine. Bilateral pulmonary fibrosis with hilar retraction and enlargement. Stable cardiomediastinal silhouette. No pneumothorax. IMPRESSION: Endotracheal tube tip about 3 cm superior to the carina. Pulmonary fibrosis. No significant change since radiograph performed earlier today. Electronically Signed   By: Donavan Foil M.D.   On: 07/14/2020 22:06   DG Chest Port 1 View  Result Date: 07/14/2020 CLINICAL DATA:  Intubated EXAM: PORTABLE CHEST 1 VIEW COMPARISON:  07/13/2020, 07/09/2020, CT chest 07/10/2020, chest x-ray 10/26/2017 FINDINGS: Interval intubation, tip of the endotracheal tube is about 3.9 cm superior to the carina. Treated thoracic compression fractures. Bilateral pulmonary fibrosis, apical dominant compatible with history of sarcoid. Hilar retraction and enlargement. No pneumothorax or pleural effusion. IMPRESSION: 1. Endotracheal tube tip about 3.9 cm superior to the carina. 2. Chronic pulmonary fibrosis with hilar retraction and enlargement. No definite acute interval change compared to most recent prior. Electronically Signed   By: Donavan Foil M.D.   On: 07/14/2020 20:43   DG OR UROLOGY CYSTO IMAGE ([facility] ONLY)  Result Date: 07/14/2020 There is no interpretation for this exam.  This order is for images obtained during a surgical procedure.  Please See "Surgeries" Tab for more information regarding the procedure.     Medications:   . sodium chloride 20 mL/hr at 07/11/20 1400  . sodium chloride    . calcium gluconate Stopped (07/15/20 1134)  . cefTRIAXone (ROCEPHIN)  IV 0 mL/hr at 07/13/20 1822  . dexmedetomidine (PRECEDEX) IV infusion Stopped  (07/15/20 0655)  . dextrose 40 mL/hr at 07/15/20 1204  . fentaNYL infusion INTRAVENOUS Stopped (07/15/20 1023)  . phenylephrine (NEO-SYNEPHRINE) Adult infusion 25 mcg/min (07/15/20 1204)   . arformoterol  15 mcg Nebulization BID  . budesonide (PULMICORT) nebulizer solution  0.25 mg Nebulization BID  . chlorhexidine  15 mL Mouth Rinse BID  . chlorhexidine gluconate (MEDLINE KIT)  15 mL Mouth Rinse BID  . Chlorhexidine Gluconate Cloth  6 each Topical Q0600  . docusate  100 mg Oral BID  . mouth rinse  15 mL Mouth Rinse 10 times per day  . methylPREDNISolone (SOLU-MEDROL) injection  40 mg Intravenous Daily  . pantoprazole (PROTONIX) IV  40 mg Intravenous QHS  . polyethylene glycol  17 g Oral Daily  . thiamine injection  100 mg Intravenous Daily  . Treprostinil  18 mcg Inhalation QID   acetaminophen, docusate sodium, fentaNYL, polyethylene glycol  Assessment/ Plan:  64 y.o. female with  sarcoidosis, interstitial lung disease, obstructive sleep apnea, depression, GERD, hyperlipidemia, hypothyroidism   admitted on 07/09/2020 for Elevated LFTs [R79.89] Sepsis ([place]) [A41.9] Sepsis, due to unspecified organism, unspecified whether acute organ dysfunction present [facility]) [A41.9]  # AKI Baseline Cr 1.0 in May 2021 Admi Cr 4.16, peaked at 4.57 Creatinine today is 2.18, improving  No indication for dialysis   #Sepsis from urinary source Klebsiella pneumonia and blood in urine Patient has significant history of renal stones.  History of left ureteroscopy May 2021  CT Abd/Pelvis on  07/14/20 IMPRESSION: 7-8 mm proximal right ureteral stone with obstructive change and perinephric stranding. Urology placed ureteral stents bilaterally on 07/14/20   #History of pulmonary sarcoidosis, pulmonary hypertension Acute on chronic respiratory failure, community-acquired pneumonia  requires home oxygen and CPAP Currently dependent on mechanical ventilation,FiO2 35% Management as per pulmonary/ICU team    LOS: [place] 9/29/20212:36 PM  [place], [place]  Note: This note was prepared with Dragon dictation. Any transcription errors are unintentional

## 2020-07-15 NOTE — Progress Notes (Signed)
Unable to extubate due to no cuff leak heard at this time.  MD aware.

## 2020-07-15 NOTE — Progress Notes (Signed)
Pt extubated to bipap at this time. Daughter at bedside.

## 2020-07-15 NOTE — Progress Notes (Signed)
Pt resting in bed on continuous bipap. Pt is alert and follows some commands but is still confused. Neo gtt is stopped. VSS on bipap. Daughter is at bedside. Pt's urine is pink tinged, MD aware.

## 2020-07-15 NOTE — Progress Notes (Signed)
Name: Stacie [name] MRN: [id number] DOB: 07/22/1956    ADMISSION DATE:  07/09/2020 INITIAL CONSULTATION DATE:  07/10/2020  REFERRING MD :  Dr. Owens Shark  CHIEF COMPLAINT:  Altered Mental Status & Shortness of Breath  BRIEF PATIENT DESCRIPTION:  64 y.o. Female admitted with Sepsis and Acute on Chronic Hypoxic Respiratory Failure in the setting of Community Acquired Pneumonia and Acute Exacerbation of Pulmonary Sarcoidosis requiring BiPAP. Pt also with UTI and AKI, Nephrology has been consulted.  SIGNIFICANT EVENTS  9/23: Presented to ED, placed on BiPAP 9/24: PCCM asked to admit 9/25: Klebsiella pneumoniae bacteremia due to UTI, associated encephalopathy 9/26: Persistent encephalopathy 9/27- Patient remains encephalopathic on BIPAP, she is oozing blood from peripheral veins and both LE toes are with ischemic changes which is new per husband.  I have called and updated Dr Kathyrn Sheriff regarding patients condition.  Concern is possible DIC onset since INR is incrementing up and PIV oozing on exam.  Patient is not on vasopressor support at this time. Restarted solumedrol due to chronic steroid therapy at home. Discussed case with Dr Candiss Norse nephrology and Dr Duwayne Heck.   9/28- patient with improved platelets this am, LFTs abnormal will have [place] RUQ today. ID on case s/p evaluation. Patient off sedation and is able to use BIPAP with AVAPS setting no overnight events.  Met with family at bedside including husband Dr Kathyrn Sheriff and daughter [name]. 07/15/20- patient is sedated this am we are weaning off sedatives and analgesics in prep for SBT to liberate from mechanical ventilator.  She is supported by 40 on neosynephrine this am, this is being weaned.  Na is increased to >150 plan is to start D5w and recheck BMP at 2pm.    STUDIES:  9/23: CXR>>Cardiac shadow is stable. Chronic scarring is noted in the apices bilaterally with hilar retraction consistent with the given clinical history of prior sarcoidosis.  Patchy increased airspace opacities are noted in the lower lungs which may represent some acute on chronic infiltrate. Changes of prior vertebral augmentation are seen. No acute bony abnormality is noted. 9/24: CT Head>>Chronic white matter ischemic changes without acute abnormality 9/24: CT Chest w/o Contrast>>Chronic changes of pulmonary sarcoidosis with biapical predominant pulmonary fibrosis. Very mild superimposed active inflammatory infiltrate. Morphologic changes compatible with pulmonary arterial hypertension.  CULTURES: SARS-CoV-2 PCR 9/23>> negative Influenza A&B 9/23>>negative Respiratory Viral Panel 9/24>> negative Blood culture 9/23>> Klebsiella pneumoniae all 4 bottles Sputum 9/24>> unable to obtain, nonproductive Urine 9/23>> Klebsiella pneumonia Strep pneumo urinary antigen 9/24>> not obtained Legionella urinary antigen 9/24>> not obtained Aspergillus 9/24>> pending Fungitel 9/24>> pending C. Diff 9/24>> no sample  ANTIBIOTICS: Azithromycin x1 dose 9/23 Ceftazidime x1 dose 9/23 Cefepime 9/24>> 9/24 Vancomycin 9/24>> 9/24 Ceftriaxone 9/24>>  PATIENT PROFILE:   Stacie [name] is a 64 y.o. Female with a past medical history significant for pulmonary sarcoidosis (on 2L [place] at baseline), pulmonary hypertension, OSA, asthma, hypertension, hypothyroidism, IBS, and chronic kidney disease who presents to [facility] ED on 07/09/20 due to altered mental status and shortness of breath.  Pt is currently confused and unable to contribute to history, and no family present, therefore history is obtained from ED and nursing notes.  PCCM asked to admit patient.  Patient has Klebsiella pneumonia bacteremia and severe sepsis.  Encephalopathy secondary to the same.   Allergies  Allergen Reactions  . Corn-Containing Products Diarrhea    headaches  . Nitrofurantoin Nausea Only  . Tramadol     Nausea and vomiting  . Sulfa Antibiotics Rash  As an infant   Scheduled Meds: . arformoterol   15 mcg Nebulization BID  . budesonide (PULMICORT) nebulizer solution  0.25 mg Nebulization BID  . chlorhexidine  15 mL Mouth Rinse BID  . chlorhexidine gluconate (MEDLINE KIT)  15 mL Mouth Rinse BID  . Chlorhexidine Gluconate Cloth  6 each Topical Q0600  . docusate  100 mg Oral BID  . mouth rinse  15 mL Mouth Rinse 10 times per day  . methylPREDNISolone (SOLU-MEDROL) injection  40 mg Intravenous Daily  . pantoprazole (PROTONIX) IV  40 mg Intravenous QHS  . polyethylene glycol  17 g Oral Daily  . thiamine injection  100 mg Intravenous Daily  . Treprostinil  18 mcg Inhalation QID   Continuous Infusions: . sodium chloride 20 mL/hr at 07/11/20 1400  . sodium chloride    . calcium gluconate 1,000 mg (07/15/20 1034)  . cefTRIAXone (ROCEPHIN)  IV 0 mL/hr at 07/13/20 1822  . dexmedetomidine (PRECEDEX) IV infusion Stopped (07/15/20 0655)  . fentaNYL infusion INTRAVENOUS 100 mcg/hr (07/15/20 0827)  . phenylephrine (NEO-SYNEPHRINE) Adult infusion 35 mcg/min (07/15/20 0827)   PRN Meds:.acetaminophen, docusate sodium, fentaNYL, polyethylene glycol   REVIEW OF SYSTEMS:   Unable to assess due to AMS  SUBJECTIVE:  Unable to assess due to AMS  VITAL SIGNS: Temp:  [97.8 F (36.6 C)-103.9 F (39.9 C)] 97.8 F (36.6 C) (09/29 0800) Pulse Rate:  [50-134] 61 (09/29 1030) Resp:  [11-32] 18 (09/29 1030) BP: (73-169)/(50-125) 106/59 (09/29 1030) SpO2:  [91 %-100 %] 94 % (09/29 1030) FiO2 (%):  [30 %-40 %] 40 % (09/29 0800) Weight:  [88.7 kg] 88.7 kg (09/29 0432)  PHYSICAL EXAMINATION: General:  Acute on chronically ill appearing female, laying in bed, on BiPAP, with mild respiratory distress, confused Neuro: Confused, erratic following of commands, no focal deficits, pupils PERRL HEENT:  Atraumatic, normocephalic, neck supple, no JVD Cardiovascular: Regular rate and rhythm, s1s2, no M/R/G Lungs:  Diffuse rhonchi, no wheezing, mild tachypnea, mild assessory muscle use, BiPAP assisted Abdomen:   Obese, soft, nontender, nondistended, no guarding or rebound tenderness, BS+ x4 Musculoskeletal: No deformities, no edema Skin:  Warm and dry.  No obvious rashes, lesions, or ulcerations  Recent Labs  Lab 07/13/20 0316 07/14/20 0404 07/15/20 0433  NA 147* 150* 156*  K 4.4 4.1 4.5  CL 107 110 114*  CO2 _0 BUN 88* 83* 76*  CREATININE 3.12* 2.46* 2.35*  GLUCOSE 158* 188* 212*   Recent Labs  Lab 07/13/20 0316 07/13/20 1301 07/14/20 0404 07/15/20 0433  HGB 12.1 11.7 12.7 13.1  HCT 37.7  --  40.6 42.6  WBC 17.2*  --  18.6* 13.7*  PLT 24*  --  39* 64*   ABG    Component Value Date/Time   PHART 7.31 (L) 07/14/2020 1930   PCO2ART 65 (H) 07/14/2020 1930   PO2ART 92 07/14/2020 1930   HCO3 30.8 (H) 07/15/2020 1011   TCO2 29 02/20/2020 1156   ACIDBASEDEF 0.5 07/12/2020 1205   O2SAT 97.1 07/15/2020 1011     ASSESSMENT / PLAN:  Acute on Chronic Hypoxic Respiratory Failure secondary to acute Exacerbation of Pulmonary Sarcoidosis/ALI -Supplemental O2 as needed to maintain O2 sats >92% -BiPAP, wean as tolerated -High risk for intubation -Follow intermittent CXR & ABG as needed -ABX as above -IV Steroids restarted -LABA/ICS: Arformoterol and Pulmicort -continue tyvasso between BIPAP -  ongoing  Acute metabolic encephalopathy Due to severe sepsis/gram-negative bacteremia- klebsiella    - present on admission     -  due to UTI Uremia Precedex General supportive care Restarted solumedrol  Support of renal function Thiamine Check free T4, TSH low -Rocephin/zithromax for abx - ID on case appreciate input  Severe sepsis secondary to Kleibsiella pneumoniae bactermia UTI, recurrent -Monitor fever curve -Trend WBC's & Procalcitonin -Follow cultures as above -Received Azithromycin & Ceftazidime in ED>>rocephin /zithromax -Discontinued Vancomycin & Cefepime -Continue Rocephin   Obstructing calculi of right kidney and nonobstructing calculi of left ureter.    -  s/p surgical decompression with stent placement - Urology on case appreciate input   - creat improved this am    AKI -Anion Gap Metabolic Acidosis, corrected -Monitor I&O's / urinary output -Follow BMP -Ensure adequate renal perfusion -Avoid nephrotoxic agents as able -Replace electrolytes as indicated -Na Bicarb alternated with normal saline per renal -Nephrology consulted, appreciate input   Mildly elevated troponin, suspected demand ischemia -Continuous cardiac monitoring -Maintain MAP >65 -EKG without ST changes   Transaminitis -NPO -Trend LFT's -Obtain Abdominal US   BEST PRACTICES DISPOSITION: ICU GOALS OF CARE: Full Code VTE Prophylaxis: Heparin SQ Consults: Nephrology Updates: No family at bedside for update 07/12/20 during MD rounds  Critical care provider statement:    Critical care time (minutes):  33   Critical care time was exclusive of:  Separately billable procedures and  treating other patients   Critical care was necessary to treat or prevent imminent or  life-threatening deterioration of the following conditions:  sepsis, klebsiella bacteremia, multiple comorbid conditions.    Critical care was time spent personally by me on the following  activities:  Development of treatment plan with patient or surrogate,  discussions with consultants, evaluation of patient's response to  treatment, examination of patient, obtaining history from patient or  surrogate, ordering and performing treatments and interventions, ordering  and review of laboratory studies and re-evaluation of patient's condition   I assumed direction of critical care for this patient from another  provider in my specialty: no     Ottie Glazier, M.D.  [facility]   07/15/2020, 10:55 AM  *This note was dictated using voice recognition software/Dragon.  Despite best efforts to proofread, errors can occur which can change the meaning.  Any  change was purely unintentional.

## 2020-07-16 ENCOUNTER — Inpatient Hospital Stay: Payer: BC Managed Care – PPO

## 2020-07-16 ENCOUNTER — Inpatient Hospital Stay
Admit: 2020-07-16 | Discharge: 2020-07-16 | Disposition: A | Discharge status: Home / Self Care | Payer: BC Managed Care – PPO | Attending: Neurology | Admitting: Neurology

## 2020-07-16 ENCOUNTER — Encounter: Payer: Self-pay | Admitting: Pulmonary Disease

## 2020-07-16 DIAGNOSIS — E870 Hyperosmolality and hypernatremia: Secondary | ICD-10-CM

## 2020-07-16 DIAGNOSIS — R4701 Aphasia: Secondary | ICD-10-CM | POA: Diagnosis not present

## 2020-07-16 DIAGNOSIS — I639 Cerebral infarction, unspecified: Secondary | ICD-10-CM | POA: Diagnosis not present

## 2020-07-16 LAB — BASIC METABOLIC PANEL
Anion gap: 10 (ref 5–15)
Anion gap: 10 (ref 5–15)
BUN: 76 mg/dL — ABNORMAL HIGH (ref 8–23)
BUN: 77 mg/dL — ABNORMAL HIGH (ref 8–23)
CO2: 31 mmol/L (ref 22–32)
CO2: 31 mmol/L (ref 22–32)
Calcium: 7.9 mg/dL — ABNORMAL LOW (ref 8.9–10.3)
Calcium: 8.1 mg/dL — ABNORMAL LOW (ref 8.9–10.3)
Chloride: 115 mmol/L — ABNORMAL HIGH (ref 98–111)
Chloride: 114 mmol/L — ABNORMAL HIGH (ref 98–111)
Creatinine, Ser: 1.92 mg/dL — ABNORMAL HIGH (ref 0.44–1.00)
Creatinine, Ser: 1.85 mg/dL — ABNORMAL HIGH (ref 0.44–1.00)
GFR calc Af Amer: 31 mL/min — ABNORMAL LOW (ref >60)
GFR calc Af Amer: 33 mL/min — ABNORMAL LOW (ref >60)
GFR calc non Af Amer: 27 mL/min — ABNORMAL LOW (ref >60)
GFR calc non Af Amer: 28 mL/min — ABNORMAL LOW (ref >60)
Glucose, Bld: 230 mg/dL — ABNORMAL HIGH (ref 70–99)
Glucose, Bld: 237 mg/dL — ABNORMAL HIGH (ref 70–99)
Potassium: 4.4 mmol/L (ref 3.5–5.1)
Potassium: 4.3 mmol/L (ref 3.5–5.1)
Sodium: 156 mmol/L — ABNORMAL HIGH (ref 135–145)
Sodium: 155 mmol/L — ABNORMAL HIGH (ref 135–145)

## 2020-07-16 LAB — PHOSPHORUS: Phosphorus: 3.2 mg/dL (ref 2.5–4.6)

## 2020-07-16 LAB — CBC WITH DIFFERENTIAL/PLATELET
Abs Immature Granulocytes: 1.35 10*3/uL — ABNORMAL HIGH (ref 0.00–0.07)
Basophils Absolute: 0.6 10*3/uL — ABNORMAL HIGH (ref 0.0–0.1)
Basophils Relative: 3 %
Eosinophils Absolute: 0 10*3/uL (ref 0.0–0.5)
Eosinophils Relative: 0 %
HCT: 42.8 % (ref 36.0–46.0)
Hemoglobin: 13.1 g/dL (ref 12.0–15.0)
Immature Granulocytes: 7 %
Lymphocytes Relative: 3 %
Lymphs Abs: 0.5 10*3/uL — ABNORMAL LOW (ref 0.7–4.0)
MCH: 30.7 pg (ref 26.0–34.0)
MCHC: 30.6 g/dL (ref 30.0–36.0)
MCV: 100.2 fL — ABNORMAL HIGH (ref 80.0–100.0)
Monocytes Absolute: 1.1 10*3/uL — ABNORMAL HIGH (ref 0.1–1.0)
Monocytes Relative: 6 %
Neutro Abs: 16 10*3/uL — ABNORMAL HIGH (ref 1.7–7.7)
Neutrophils Relative %: 81 %
Platelets: 77 10*3/uL — ABNORMAL LOW (ref 150–400)
RBC: 4.27 MIL/uL (ref 3.87–5.11)
RDW: 14.2 % (ref 11.5–15.5)
Smear Review: NORMAL
WBC: 19.6 10*3/uL — ABNORMAL HIGH (ref 4.0–10.5)
nRBC: 0.4 % — ABNORMAL HIGH (ref 0.0–0.2)

## 2020-07-16 LAB — BLOOD GAS, ARTERIAL
Acid-Base Excess: 6.7 mmol/L — ABNORMAL HIGH (ref 0.0–2.0)
Bicarbonate: 33 mmol/L — ABNORMAL HIGH (ref 20.0–28.0)
FIO2: 0.28
O2 Saturation: 95.2 %
Patient temperature: 37
pCO2 arterial: 52 mmHg — ABNORMAL HIGH (ref 32.0–48.0)
pH, Arterial: 7.41 (ref 7.350–7.450)
pO2, Arterial: 76 mmHg — ABNORMAL LOW (ref 83.0–108.0)

## 2020-07-16 LAB — GLUCOSE, CAPILLARY
Glucose-Capillary: 144 mg/dL — ABNORMAL HIGH (ref 70–99)
Glucose-Capillary: 177 mg/dL — ABNORMAL HIGH (ref 70–99)
Glucose-Capillary: 203 mg/dL — ABNORMAL HIGH (ref 70–99)
Glucose-Capillary: 211 mg/dL — ABNORMAL HIGH (ref 70–99)
Glucose-Capillary: 219 mg/dL — ABNORMAL HIGH (ref 70–99)
Glucose-Capillary: 222 mg/dL — ABNORMAL HIGH (ref 70–99)
Glucose-Capillary: 227 mg/dL — ABNORMAL HIGH (ref 70–99)

## 2020-07-16 LAB — HEPATIC FUNCTION PANEL
ALT: 157 U/L — ABNORMAL HIGH (ref 0–44)
AST: 54 U/L — ABNORMAL HIGH (ref 15–41)
Albumin: 2.6 g/dL — ABNORMAL LOW (ref 3.5–5.0)
Alkaline Phosphatase: 71 U/L (ref 38–126)
Bilirubin, Direct: 0.1 mg/dL (ref 0.0–0.2)
Indirect Bilirubin: 0.8 mg/dL (ref 0.3–0.9)
Total Bilirubin: 0.9 mg/dL (ref 0.3–1.2)
Total Protein: 5.9 g/dL — ABNORMAL LOW (ref 6.5–8.1)

## 2020-07-16 LAB — ROCKY MTN SPOTTED FVR ABS PNL(IGG+IGM)
RMSF IgG: NEGATIVE
RMSF IgM: 1.53 index — ABNORMAL HIGH (ref 0.00–0.89)

## 2020-07-16 LAB — HEMOGLOBIN A1C
Hgb A1c MFr Bld: 6.4 % — ABNORMAL HIGH (ref 4.8–5.6)
Mean Plasma Glucose: 136.98 mg/dL

## 2020-07-16 LAB — MAGNESIUM: Magnesium: 2.2 mg/dL (ref 1.7–2.4)

## 2020-07-16 LAB — SODIUM
Sodium: 150 mmol/L — ABNORMAL HIGH (ref 135–145)
Sodium: 154 mmol/L — ABNORMAL HIGH (ref 135–145)

## 2020-07-16 MED ORDER — ASPIRIN EC 81 MG PO TBEC
81.0000 mg | DELAYED_RELEASE_TABLET | Freq: Every day | ORAL | Status: DC
  Administered 2020-07-19 – 2020-07-28 (×10): 81 mg via ORAL
  Filled 2020-07-16 (×10): qty 1

## 2020-07-16 MED ORDER — GADOBUTROL 1 MMOL/ML IV SOLN
8.0000 mL | Freq: Once | INTRAVENOUS | Status: AC | PRN
  Administered 2020-07-16: 8 mL via INTRAVENOUS

## 2020-07-16 MED ORDER — SODIUM CHLORIDE 0.9 % IV SOLN: INTRAVENOUS | Status: DC

## 2020-07-16 MED ORDER — IOHEXOL 350 MG/ML SOLN
100.0000 mL | Freq: Once | INTRAVENOUS | Status: AC | PRN
  Administered 2020-07-16: 100 mL via INTRAVENOUS

## 2020-07-16 NOTE — Code Documentation (Signed)
Code stroke activated, Dr. Doy Mince at bedside upon arrival, initial NIHSS 16, unclear LKW, pt admitted for sepsis, ICU team thought pt was more confused this AM, 18g IV established, pt taken to CT, pt moving and confused, after CT pt taken back to ICU, report off to [facility]

## 2020-07-16 NOTE — Progress Notes (Signed)
CODE STROKE- PHARMACY COMMUNICATION   Time CODE STROKE called/page received: 1042  Time response to CODE STROKE was made (in person or via phone): 1042  Name of Provider/Nurse contacted: Drs. Aleskerov and [employer]  **Code stroke called while on rounds in the ICU. Patient discussed with rounding team, including Dr. Doy Mince. Patient's last known well appears to be prior to admission. Patient to get imaging now, very unlikely tPA will be given.**   Tawnya Crook ,PharmD Clinical Pharmacist  07/16/2020  11:16 AM

## 2020-07-16 NOTE — Code Documentation (Signed)
Dr. Register at bedside to assess infiltrated IV in right arm, instructed to elevate and place ice on arm, RN Charlie informed

## 2020-07-16 NOTE — Progress Notes (Signed)
[place], [place] 07/16/20  Subjective:   Hospital day # 6  Patient awake and alert this morning, able follow some commands.She is on 2L supplemental O2 via [place].SR on heart monitor with HR in 90's.F/C with blood tinged water melon color urine output. Total UOP for the preceding 24 hours is 2450 ml.   Renal: 09/29 0701 - 09/30 0700 In: 1565.6 [I.V.:1396.6; IV Piggyback:169] Out: 2450 [Urine:2450] Lab Results  Component Value Date   CREATININE 1.85 (H) 07/16/2020   CREATININE 1.92 (H) 07/16/2020   CREATININE 2.18 (H) 07/15/2020     Objective:  Vital signs in last 24 hours:  Temp:  [97.5 F (36.4 C)-99.2 F (37.3 C)] 97.5 F (36.4 C) (09/30 1240) Pulse Rate:  [83-109] 99 (09/30 1200) Resp:  [7-31] 12 (09/30 1240) BP: (120-157)/(67-86) 120/72 (09/30 1100) SpO2:  [93 %-100 %] 95 % (09/30 1200) FiO2 (%):  [40 %] 40 % (09/30 0750) Weight:  [87.5 kg] 87.5 kg (09/30 0500)  Weight change: -1.2 kg Filed Weights   07/14/20 0500 07/15/20 0432 07/16/20 0500  Weight: 86.2 kg 88.7 kg 87.5 kg    Intake/Output:    Intake/Output Summary (Last 24 hours) at 07/16/2020 1550 Last data filed at 07/16/2020 1255 Gross per 24 hour  Intake 934.85 ml  Output 2900 ml  Net -1965.15 ml     Physical Exam: General:  Awake,alert  HEENT Normocephalic,atraumatic, [place] with 2L O2  Pulm/lungs  Wheezes + bilaterally  CVS/Heart  S1S2 +,regular  Abdomen:   Soft, nondistended  Extremities:  No peripheral edema  Neurologic:  Awake,alert, follows certain commands  Skin:  Cool, purple toes 1,2 on right, left dorsal chronic purple lesion    Basic Metabolic Panel:  Recent Labs  Lab 07/12/20 0735 07/12/20 0735 07/13/20 0316 07/13/20 0316 07/14/20 0404 07/14/20 0404 07/15/20 0433 07/15/20 0433 07/15/20 1343 07/16/20 0706 07/16/20 0930 07/16/20 1329  NA 143   < > 147*   < > 150*   < > 156*  --  156* 156* 155* 150*  K 4.8   < > 4.4   < > 4.1  --  4.5  --  4.5 4.4  4.3  --   CL 103   < > 107   < > 110  --  114*  --  115* 115* 114*  --   CO2 24   < > 27   < > 24  --  31  --  _0 --   GLUCOSE 164*   < > 158*   < > 188*  --  212*  --  202* 230* 237*  --   BUN 89*   < > 88*   < > 83*  --  76*  --  79* 76* 77*  --   CREATININE 3.75*   < > 3.12*   < > 2.46*  --  2.35*  --  2.18* 1.92* 1.85*  --   CALCIUM 7.1*   < > 6.6*   < > 7.4*   < > 7.6*   < > 7.7* 7.9* 8.1*  --   MG 2.8*  --  2.6*  --  2.5*  --  2.5*  --   --  2.2  --   --   PHOS 6.8*  --  4.3  --  5.3*  --  5.8*  --   --  3.2  --   --    < > = values in this interval  not displayed.     CBC: Recent Labs  Lab 07/12/20 0735 07/12/20 0735 07/13/20 0316 07/13/20 1301 07/14/20 0404 07/15/20 0433 07/16/20 0706  WBC 33.4*  --  17.2*  --  18.6* 13.7* 19.6*  NEUTROABS 31.5*  --  15.1*  --  16.4* 11.5* 16.0*  HGB 12.0   < > 12.1 11.7 12.7 13.1 13.1  HCT 35.6*  --  37.7  --  40.6 42.6 42.8  MCV 92.2  --  95.7  --  96.9 99.1 100.2*  PLT 33*  --  24*  --  39* 64* 77*   < > = values in this interval not displayed.      Lab Results  Component Value Date   HEPBSAG NON REACTIVE 07/15/2020   HEPBIGM NON REACTIVE 07/15/2020      Microbiology:  Recent Results (from the past 240 hour(s))  Urine culture     Status: Abnormal   Collection Time: 07/09/20 11:27 PM   Specimen: In/Out Cath Urine  Result Value Ref Range Status   Specimen Description   Final    IN/OUT CATH URINE Performed at [facility], [address] [place], [place] [address]    Special Requests   Final    NONE Performed at [facility], [place], [place] [address]    Culture >=100,000 COLONIES/mL KLEBSIELLA PNEUMONIAE (A)  Final   Report Status 07/12/2020 FINAL  Final   Organism ID, Bacteria KLEBSIELLA PNEUMONIAE (A)  Final      Susceptibility   Klebsiella pneumoniae - MIC*    AMPICILLIN RESISTANT Resistant     CEFAZOLIN <=4 SENSITIVE Sensitive     CEFTRIAXONE <=0.25 SENSITIVE  Sensitive     CIPROFLOXACIN <=0.25 SENSITIVE Sensitive     GENTAMICIN <=1 SENSITIVE Sensitive     IMIPENEM <=0.25 SENSITIVE Sensitive     NITROFURANTOIN <=16 SENSITIVE Sensitive     TRIMETH/SULFA <=20 SENSITIVE Sensitive     AMPICILLIN/SULBACTAM <=2 SENSITIVE Sensitive     PIP/TAZO <=4 SENSITIVE Sensitive     * >=100,000 COLONIES/mL KLEBSIELLA PNEUMONIAE  Respiratory Panel by RT PCR (Flu A&B, Covid) - Nasopharyngeal Swab     Status: None   Collection Time: 07/09/20 11:28 PM   Specimen: Nasopharyngeal Swab  Result Value Ref Range Status   SARS Coronavirus 2 by RT PCR NEGATIVE NEGATIVE Final    Comment: (NOTE) SARS-CoV-2 target nucleic acids are NOT DETECTED.  The SARS-CoV-2 RNA is generally detectable in upper respiratoy specimens during the acute phase of infection. The lowest concentration of SARS-CoV-2 viral copies this assay can detect is 131 copies/mL. A negative result does not preclude SARS-Cov-2 infection and should not be used as the sole basis for treatment or other patient management decisions. A negative result may occur with  improper specimen collection/handling, submission of specimen other than nasopharyngeal swab, presence of viral mutation(s) within the areas targeted by this assay, and inadequate number of viral copies (<131 copies/mL). A negative result must be combined with clinical observations, patient history, and epidemiological information. The expected result is Negative.  Fact Sheet for Patients:  PinkCheek.be  Fact Sheet for Healthcare Providers:  GravelBags.it  This test is no t yet approved or cleared by the [place] FDA and  has been authorized for detection and/or diagnosis of SARS-CoV-2 by FDA under an Emergency Use Authorization (EUA). This EUA will remain  in effect (meaning this test can be used) for the duration of the COVID-19 declaration under Section 564(b)(1) of the Act,  21  U.S.C. section 360bbb-3(b)(1), unless the authorization is terminated or revoked sooner.     Influenza A by PCR NEGATIVE NEGATIVE Final   Influenza B by PCR NEGATIVE NEGATIVE Final    Comment: (NOTE) The Xpert Xpress SARS-CoV-2/FLU/RSV assay is intended as an aid in  the diagnosis of influenza from Nasopharyngeal swab specimens and  should not be used as a sole basis for treatment. Nasal washings and  aspirates are unacceptable for Xpert Xpress SARS-CoV-2/FLU/RSV  testing.  Fact Sheet for Patients: PinkCheek.be  Fact Sheet for Healthcare Providers: GravelBags.it  This test is not yet approved or cleared by the [place] FDA and  has been authorized for detection and/or diagnosis of SARS-CoV-2 by  FDA under an Emergency Use Authorization (EUA). This EUA will remain  in effect (meaning this test can be used) for the duration of the  Covid-19 declaration under Section 564(b)(1) of the Act, 21  U.S.C. section 360bbb-3(b)(1), unless the authorization is  terminated or revoked. Performed at [facility], [address] [place], [place] [address]   Blood Culture (routine x 2)     Status: Abnormal   Collection Time: 07/09/20 11:36 PM   Specimen: BLOOD  Result Value Ref Range Status   Specimen Description   Final    BLOOD RIGHT ANTECUBITAL Performed at [facility], [address] [place], [place] [address]    Special Requests   Final    BOTTLES DRAWN AEROBIC AND ANAEROBIC Blood Culture adequate volume Performed at [facility], [address] [place], [place] [address]    Culture  Setup Time   Final    Organism ID to follow GRAM NEGATIVE RODS IN BOTH AEROBIC AND ANAEROBIC BOTTLES CRITICAL RESULT CALLED TO, READ BACK BY AND VERIFIED WITH: AMY THOMPSON AT 1220 07/10/20 SDR Performed at [facility], [address] [place], [place] [address]    Culture KLEBSIELLA PNEUMONIAE  (A)  Final   Report Status 07/12/2020 FINAL  Final   Organism ID, Bacteria KLEBSIELLA PNEUMONIAE  Final      Susceptibility   Klebsiella pneumoniae - MIC*    AMPICILLIN RESISTANT Resistant     CEFAZOLIN <=4 SENSITIVE Sensitive     CEFEPIME <=0.12 SENSITIVE Sensitive     CEFTAZIDIME <=1 SENSITIVE Sensitive     CEFTRIAXONE <=0.25 SENSITIVE Sensitive     CIPROFLOXACIN <=0.25 SENSITIVE Sensitive     GENTAMICIN <=1 SENSITIVE Sensitive     IMIPENEM <=0.25 SENSITIVE Sensitive     TRIMETH/SULFA <=20 SENSITIVE Sensitive     AMPICILLIN/SULBACTAM <=2 SENSITIVE Sensitive     PIP/TAZO <=4 SENSITIVE Sensitive     * KLEBSIELLA PNEUMONIAE  Blood Culture ID Panel (Reflexed)     Status: Abnormal   Collection Time: 07/09/20 11:36 PM  Result Value Ref Range Status   Enterococcus faecalis NOT DETECTED NOT DETECTED Final   Enterococcus Faecium NOT DETECTED NOT DETECTED Final   Listeria monocytogenes NOT DETECTED NOT DETECTED Final   Staphylococcus species NOT DETECTED NOT DETECTED Final   Staphylococcus aureus (BCID) NOT DETECTED NOT DETECTED Final   Staphylococcus epidermidis NOT DETECTED NOT DETECTED Final   Staphylococcus lugdunensis NOT DETECTED NOT DETECTED Final   Streptococcus species NOT DETECTED NOT DETECTED Final   Streptococcus agalactiae NOT DETECTED NOT DETECTED Final   Streptococcus pneumoniae NOT DETECTED NOT DETECTED Final   Streptococcus pyogenes NOT DETECTED NOT DETECTED Final   A.calcoaceticus-baumannii NOT DETECTED NOT DETECTED Final   Bacteroides fragilis NOT DETECTED NOT DETECTED Final   Enterobacterales  DETECTED (A) NOT DETECTED Final    Comment: Enterobacterales represent a large order of gram negative bacteria, not a single organism. CRITICAL RESULT CALLED TO, READ BACK BY AND VERIFIED WITH:  AMY THOMPSON AT 5697 04/09/20 SDR    Enterobacter cloacae complex NOT DETECTED NOT DETECTED Final   Escherichia coli NOT DETECTED NOT DETECTED Final   Klebsiella aerogenes NOT DETECTED  NOT DETECTED Final   Klebsiella oxytoca NOT DETECTED NOT DETECTED Final   Klebsiella pneumoniae DETECTED (A) NOT DETECTED Final    Comment: CRITICAL RESULT CALLED TO, READ BACK BY AND VERIFIED WITH:  AMY THOMPSON AT 1220 07/10/20 SDR    Proteus species NOT DETECTED NOT DETECTED Final   Salmonella species NOT DETECTED NOT DETECTED Final   Serratia marcescens NOT DETECTED NOT DETECTED Final   Haemophilus influenzae NOT DETECTED NOT DETECTED Final   Neisseria meningitidis NOT DETECTED NOT DETECTED Final   Pseudomonas aeruginosa NOT DETECTED NOT DETECTED Final   Stenotrophomonas maltophilia NOT DETECTED NOT DETECTED Final   Candida albicans NOT DETECTED NOT DETECTED Final   Candida auris NOT DETECTED NOT DETECTED Final   Candida glabrata NOT DETECTED NOT DETECTED Final   Candida krusei NOT DETECTED NOT DETECTED Final   Candida parapsilosis NOT DETECTED NOT DETECTED Final   Candida tropicalis NOT DETECTED NOT DETECTED Final   Cryptococcus neoformans/gattii NOT DETECTED NOT DETECTED Final   CTX-M ESBL NOT DETECTED NOT DETECTED Final   Carbapenem resistance IMP NOT DETECTED NOT DETECTED Final   Carbapenem resistance KPC NOT DETECTED NOT DETECTED Final   Carbapenem resistance NDM NOT DETECTED NOT DETECTED Final   Carbapenem resist OXA 48 LIKE NOT DETECTED NOT DETECTED Final   Carbapenem resistance VIM NOT DETECTED NOT DETECTED Final    Comment: Performed at [facility], [address] [place], [place] [address]  Blood Culture (routine x 2)     Status: Abnormal   Collection Time: 07/09/20 11:37 PM   Specimen: BLOOD  Result Value Ref Range Status   Specimen Description   Final    BLOOD BLOOD RIGHT FOREARM Performed at [facility], [address], [place], [place] [address]    Special Requests   Final    BOTTLES DRAWN AEROBIC AND ANAEROBIC Blood Culture adequate volume Performed at [facility], [address] [place], [place] [address]    Culture  Setup  Time   Final    GRAM NEGATIVE RODS IN BOTH AEROBIC AND ANAEROBIC BOTTLES CRITICAL RESULT CALLED TO, READ BACK BY AND VERIFIED WITH: AMY THOMPSON AT 1220 07/10/20 SDR Performed at [facility], [address] [place], [place] [address]    Culture (A)  Final    KLEBSIELLA PNEUMONIAE SUSCEPTIBILITIES PERFORMED ON PREVIOUS CULTURE WITHIN THE LAST 5 DAYS. Performed at [facility], [place] [address], [place],  [address]    Report Status 07/12/2020 FINAL  Final  Aspergillus Ag, BAL/Serum     Status: None   Collection Time: 07/10/20  5:04 AM   Specimen: Blood  Result Value Ref Range Status   Aspergillus Ag, BAL/Serum 0.03 0.00 - 0.49 Index Final    Comment: (NOTE) Performed At: [facility] [place], [place] [id number] Rush Farmer MD JO:[id number]   MRSA PCR Screening     Status: None   Collection Time: 07/10/20 12:54 PM   Specimen: Nasal Mucosa; Nasopharyngeal  Result Value Ref Range Status   MRSA by PCR NEGATIVE NEGATIVE Final    Comment:  The GeneXpert MRSA Assay (FDA approved for NASAL specimens only), is one component of a comprehensive MRSA colonization surveillance program. It is not intended to diagnose MRSA infection nor to guide or monitor treatment for MRSA infections. Performed at [facility], [address] [place], [place] [address]     Coagulation Studies: Recent Labs    07/14/20 1334  LABPROT 15.8*  INR 1.3*    Urinalysis: No results for input(s): COLORURINE, LABSPEC, PHURINE, GLUCOSEU, HGBUR, BILIRUBINUR, KETONESUR, PROTEINUR, UROBILINOGEN, NITRITE, LEUKOCYTESUR in the last 72 hours.  Invalid input(s): APPERANCEUR    Imaging: CT ANGIO HEAD W OR WO CONTRAST  Result Date: 07/16/2020 CLINICAL DATA:  Stroke suspected.  Unable to move right side. EXAM: CT ANGIOGRAPHY HEAD AND NECK CT PERFUSION BRAIN TECHNIQUE: Multidetector CT imaging of the head and neck was performed using the standard  protocol during bolus administration of intravenous contrast. Multiplanar CT image reconstructions and MIPs were obtained to evaluate the vascular anatomy. Carotid stenosis measurements (when applicable) are obtained utilizing NASCET criteria, using the distal internal carotid diameter as the denominator. Multiphase CT imaging of the brain was performed following IV bolus contrast injection. Subsequent parametric perfusion maps were calculated using RAPID software. CONTRAST:  124m OMNIPAQUE IOHEXOL 350 MG/ML SOLN COMPARISON:  CT head 07/10/2020 FINDINGS: CT HEAD FINDINGS Brain: Evaluation is limited by patient motion. There is no definite evidence of acute large vascular territory infarct. There is similar extensive patchy white matter hypoattenuation, most likely the sequela of chronic microvascular ischemic disease. No acute hemorrhage. No abnormal mass effect or mass lesion. No hydrocephalus. Vascular: No hyperdense vessel identified. Calcific atherosclerosis. Skull: No evidence of acute fracture. Sinuses/Orbits: Limit evaluation without evidence of fracture or focal lesion. Other: None. ASPECTS ([facility]) Total score (0-10 with 10 being normal): 10 Review of the MIP images confirms the above findings CTA NECK FINDINGS Nondiagnostic arterial evaluation in the upper neck/skull base, including the internal carotid arteries in the neck and mid to distal vertebral arteries. Aortic arch: Calcified atherosclerosis without evidence of significant stenosis or aneurysm. Right carotid system: The visualized common carotid artery is within normal limits without significant stenosis or occlusion. Nondiagnostic evaluation of the distal common carotid artery and internal carotid artery to the level of the petrous carotid. Left carotid system: Calcified and noncalcified atherosclerosis of the common carotid artery without evidence of significant stenosis. Nondiagnostic evaluation of the distal  common carotid artery and internal carotid artery to the level of the petrous carotid. Vertebral arteries: Approximately, no significant stenosis or occlusion. Nondiagnostic evaluation of the mid to distal vertebral arteries. Skeleton: Multilevel severe degenerative change. Other neck: No evidence of mass or adenopathy. Upper chest: Partially imaged fibrotic lung changes, possibly related to the patient's history of sarcoidosis. Review of the MIP images confirms the above findings CTA HEAD FINDINGS Anterior circulation: Calcific atherosclerosis of bilateral cavernous carotids without evidence of greater than 50% narrowing. Bilateral M1 MCAs and proximal M2 MCA is are patent without evidence of significant stenosis. Distal MCA branches appear relatively symmetric. The right A1 ACA is hypoplastic with a large left A1 ACA, likely anatomic variation. Posterior circulation: No significant stenosis or occlusion of the distal V4 vertebral arteries and basilar artery. Fetal type right PCA. No significant stenosis identified of the posterior cerebral arteries. No aneurysm. Venous sinuses: As permitted by contrast timing, [email or web address] Review of the MIP images confirms the above findings CT Brain Perfusion Findings: ASPECTS: 10 CBF (<30%) Volume: 034mPerfusion (Tmax>6.0s) volume: 27m64mismatch  Volume: 26m Calculated T-max greater than 6 sec and mismatch is favored artifactual given location in a region of streak/motion and non vascular territory. IMPRESSION: 1. Overall limited evaluation given patient motion with nondiagnostic arterial evaluation in the upper neck/skull base, including the internal carotid arteries and mid to distal vertebral arteries in the neck. Within this limitation, no evidence of acute intracranial abnormality, large vessel occlusion or significant (greater than 50%) arterial stenosis. Repeat CTA neck could further evaluate if clinically indicated. 2. No convincing penumbra. Small area of reported mismatch  is favored artifactual. 3. Partially imaged fibrotic lung changes, possibly related to the patient's reported history of sarcoidosis. Code stroke imaging results were communicated on 07/16/2020 at 11:58 am to provider Dr. RDoy Mincevia telephone, who verbally acknowledged these results. Electronically Signed   By: FMargaretha SheffieldMD   On: 07/16/2020 12:17   DG Abd 1 View  Result Date: 07/15/2020 CLINICAL DATA:  Abdominal distension. EXAM: ABDOMEN - 1 VIEW COMPARISON:  November 18, 2014. FINDINGS: The bowel gas pattern is normal. Bilateral ureteral stents are noted. Surgical clips are seen overlying the sacrum. No radio-opaque calculi or other significant radiographic abnormality are seen. IMPRESSION: No evidence of bowel obstruction or ileus. Electronically Signed   By: JMarijo ConceptionM.D.   On: 07/15/2020 08:28   CT ANGIO NECK W OR WO CONTRAST  Result Date: 07/16/2020 CLINICAL DATA:  Stroke suspected.  Unable to move right side. EXAM: CT ANGIOGRAPHY HEAD AND NECK CT PERFUSION BRAIN TECHNIQUE: Multidetector CT imaging of the head and neck was performed using the standard protocol during bolus administration of intravenous contrast. Multiplanar CT image reconstructions and MIPs were obtained to evaluate the vascular anatomy. Carotid stenosis measurements (when applicable) are obtained utilizing NASCET criteria, using the distal internal carotid diameter as the denominator. Multiphase CT imaging of the brain was performed following IV bolus contrast injection. Subsequent parametric perfusion maps were calculated using RAPID software. CONTRAST:  1081mOMNIPAQUE IOHEXOL 350 MG/ML SOLN COMPARISON:  CT head 07/10/2020 FINDINGS: CT HEAD FINDINGS Brain: Evaluation is limited by patient motion. There is no definite evidence of acute large vascular territory infarct. There is similar extensive patchy white matter hypoattenuation, most likely the sequela of chronic microvascular ischemic disease. No acute hemorrhage.  No abnormal mass effect or mass lesion. No hydrocephalus. Vascular: No hyperdense vessel identified. Calcific atherosclerosis. Skull: No evidence of acute fracture. Sinuses/Orbits: Limit evaluation without evidence of fracture or focal lesion. Other: None. ASPECTS ([facility] Early CT Score) Total score (0-10 with 10 being normal): 10 Review of the MIP images confirms the above findings CTA NECK FINDINGS Nondiagnostic arterial evaluation in the upper neck/skull base, including the internal carotid arteries in the neck and mid to distal vertebral arteries. Aortic arch: Calcified atherosclerosis without evidence of significant stenosis or aneurysm. Right carotid system: The visualized common carotid artery is within normal limits without significant stenosis or occlusion. Nondiagnostic evaluation of the distal common carotid artery and internal carotid artery to the level of the petrous carotid. Left carotid system: Calcified and noncalcified atherosclerosis of the common carotid artery without evidence of significant stenosis. Nondiagnostic evaluation of the distal common carotid artery and internal carotid artery to the level of the petrous carotid. Vertebral arteries: Approximately, no significant stenosis or occlusion. Nondiagnostic evaluation of the mid to distal vertebral arteries. Skeleton: Multilevel severe degenerative change. Other neck: No evidence of mass or adenopathy. Upper chest: Partially imaged fibrotic lung changes, possibly related to the patient's history of sarcoidosis. Review  of the MIP images confirms the above findings CTA HEAD FINDINGS Anterior circulation: Calcific atherosclerosis of bilateral cavernous carotids without evidence of greater than 50% narrowing. Bilateral M1 MCAs and proximal M2 MCA is are patent without evidence of significant stenosis. Distal MCA branches appear relatively symmetric. The right A1 ACA is hypoplastic with a large left A1 ACA, likely anatomic  variation. Posterior circulation: No significant stenosis or occlusion of the distal V4 vertebral arteries and basilar artery. Fetal type right PCA. No significant stenosis identified of the posterior cerebral arteries. No aneurysm. Venous sinuses: As permitted by contrast timing, [email or web address] Review of the MIP images confirms the above findings CT Brain Perfusion Findings: ASPECTS: 10 CBF (<30%) Volume: 49m Perfusion (Tmax>6.0s) volume: 660mMismatch Volume: 29m8malculated T-max greater than 6 sec and mismatch is favored artifactual given location in a region of streak/motion and non vascular territory. IMPRESSION: 1. Overall limited evaluation given patient motion with nondiagnostic arterial evaluation in the upper neck/skull base, including the internal carotid arteries and mid to distal vertebral arteries in the neck. Within this limitation, no evidence of acute intracranial abnormality, large vessel occlusion or significant (greater than 50%) arterial stenosis. Repeat CTA neck could further evaluate if clinically indicated. 2. No convincing penumbra. Small area of reported mismatch is favored artifactual. 3. Partially imaged fibrotic lung changes, possibly related to the patient's reported history of sarcoidosis. Code stroke imaging results were communicated on 07/16/2020 at 11:58 am to provider Dr. ReyDoy Mincea telephone, who verbally acknowledged these results. Electronically Signed   By: FreMargaretha Sheffield   On: 07/16/2020 12:17   MR BRAIN W WO CONTRAST  Result Date: 07/16/2020 CLINICAL DATA:  Neuro deficit, acute, stroke suspected. EXAM: MRI HEAD WITHOUT AND WITH CONTRAST TECHNIQUE: Multiplanar, multiecho pulse sequences of the brain and surrounding structures were obtained without and with intravenous contrast. CONTRAST:  8mL70mDAVIST GADOBUTROL 1 MMOL/ML IV SOLN COMPARISON:  Same day CT imaging FINDINGS: Brain: Severely limited evaluation with multiple nondiagnostic sequences. Diffusion-weighted imaging  is significantly limited with multiple small areas of DWI hyperintensity in the white matter. Patchy bilateral white matter T2/FLAIR hyperintensities, likely the sequela of chronic microvascular ischemic disease. Mild diffuse cerebral volume loss with ex vacuo ventricular dilation. No hydrocephalus. No large hemorrhage, mass lesion, or significant mass effect. Vascular: Better evaluated on same-day CT code stroke. Flow voids are grossly maintained at the skull base. Skull and upper cervical spine: Not well evaluated Sinuses/Orbits: No substantial paranasal sinus disease. No acute orbital abnormality. Other: Small left mastoid effusion. IMPRESSION: Severely limited evaluation with multiple nondiagnostic sequences. Diffusion-weighted imaging is significantly limited with multiple small areas of DWI hyperintensity in the bilateral white matter representing artifact versus small infarcts. Repeat MRI (possibly with sedation) could further evaluate if clinically indicated. Electronically Signed   By: FredMargaretha Sheffield  On: 07/16/2020 12:46   US [place]ERIAL LOWER EXTREMITY DUPLEX BILATERAL  Result Date: 07/15/2020 CLINICAL DATA:  64 y56r old female with history of critical limb ischemia. EXAM: BILATERAL LOWER EXTREMITY ARTERIAL DUPLEX SCAN TECHNIQUE: Gray-scale sonography as well as color Doppler and duplex ultrasound was performed to evaluate the arteries of both lower extremities including the common, superficial and profunda femoral arteries, popliteal artery and calf arteries. COMPARISON:  None. FINDINGS: Right Lower Extremity ABI: Not obtained. Inflow: Normal common femoral arterial waveforms and velocities. No evidence of inflow (aortoiliac) disease. Outflow: Normal profunda femoral, superficial femoral and popliteal arterial waveforms and velocities. No focal elevation of the PSV to suggest stenosis. Runoff: Normal posterior and  anterior tibial arterial waveforms and velocities. Vessels are patent to the ankle.  Left Lower Extremity ABI: Not obtained. Inflow: Normal common femoral arterial waveforms and velocities. No evidence of inflow (aortoiliac) disease. Outflow: Normal profunda femoral, superficial femoral and popliteal arterial waveforms and velocities. No focal elevation of the PSV to suggest stenosis. Runoff: Normal posterior and anterior tibial arterial waveforms and velocities. Vessels are patent to the ankle. IMPRESSION: Patent bilateral lower extremity arteries with normal waveforms and no evidence of significant atherosclerosis or flow limiting stenosis. Ruthann Cancer, MD [facility] Radiology Electronically Signed   By: Ruthann Cancer MD   On: 07/15/2020 07:42   CT CEREBRAL PERFUSION W CONTRAST  Result Date: 07/16/2020 CLINICAL DATA:  Stroke suspected.  Unable to move right side. EXAM: CT ANGIOGRAPHY HEAD AND NECK CT PERFUSION BRAIN TECHNIQUE: Multidetector CT imaging of the head and neck was performed using the standard protocol during bolus administration of intravenous contrast. Multiplanar CT image reconstructions and MIPs were obtained to evaluate the vascular anatomy. Carotid stenosis measurements (when applicable) are obtained utilizing NASCET criteria, using the distal internal carotid diameter as the denominator. Multiphase CT imaging of the brain was performed following IV bolus contrast injection. Subsequent parametric perfusion maps were calculated using RAPID software. CONTRAST:  12m OMNIPAQUE IOHEXOL 350 MG/ML SOLN COMPARISON:  CT head 07/10/2020 FINDINGS: CT HEAD FINDINGS Brain: Evaluation is limited by patient motion. There is no definite evidence of acute large vascular territory infarct. There is similar extensive patchy white matter hypoattenuation, most likely the sequela of chronic microvascular ischemic disease. No acute hemorrhage. No abnormal mass effect or mass lesion. No hydrocephalus. Vascular: No hyperdense vessel identified.  Calcific atherosclerosis. Skull: No evidence of acute fracture. Sinuses/Orbits: Limit evaluation without evidence of fracture or focal lesion. Other: None. ASPECTS ([facility] Early CT Score) Total score (0-10 with 10 being normal): 10 Review of the MIP images confirms the above findings CTA NECK FINDINGS Nondiagnostic arterial evaluation in the upper neck/skull base, including the internal carotid arteries in the neck and mid to distal vertebral arteries. Aortic arch: Calcified atherosclerosis without evidence of significant stenosis or aneurysm. Right carotid system: The visualized common carotid artery is within normal limits without significant stenosis or occlusion. Nondiagnostic evaluation of the distal common carotid artery and internal carotid artery to the level of the petrous carotid. Left carotid system: Calcified and noncalcified atherosclerosis of the common carotid artery without evidence of significant stenosis. Nondiagnostic evaluation of the distal common carotid artery and internal carotid artery to the level of the petrous carotid. Vertebral arteries: Approximately, no significant stenosis or occlusion. Nondiagnostic evaluation of the mid to distal vertebral arteries. Skeleton: Multilevel severe degenerative change. Other neck: No evidence of mass or adenopathy. Upper chest: Partially imaged fibrotic lung changes, possibly related to the patient's history of sarcoidosis. Review of the MIP images confirms the above findings CTA HEAD FINDINGS Anterior circulation: Calcific atherosclerosis of bilateral cavernous carotids without evidence of greater than 50% narrowing. Bilateral M1 MCAs and proximal M2 MCA is are patent without evidence of significant stenosis. Distal MCA branches appear relatively symmetric. The right A1 ACA is hypoplastic with a large left A1 ACA, likely anatomic variation. Posterior circulation: No significant stenosis or occlusion of the distal V4 vertebral arteries  and basilar artery. Fetal type right PCA. No significant stenosis identified of the posterior cerebral arteries. No aneurysm. Venous sinuses: As permitted by contrast timing, [email or web address] Review of the MIP images confirms the above findings CT Brain  Perfusion Findings: ASPECTS: 10 CBF (<30%) Volume: 68m Perfusion (Tmax>6.0s) volume: 660mMismatch Volume: 44m31malculated T-max greater than 6 sec and mismatch is favored artifactual given location in a region of streak/motion and non vascular territory. IMPRESSION: 1. Overall limited evaluation given patient motion with nondiagnostic arterial evaluation in the upper neck/skull base, including the internal carotid arteries and mid to distal vertebral arteries in the neck. Within this limitation, no evidence of acute intracranial abnormality, large vessel occlusion or significant (greater than 50%) arterial stenosis. Repeat CTA neck could further evaluate if clinically indicated. 2. No convincing penumbra. Small area of reported mismatch is favored artifactual. 3. Partially imaged fibrotic lung changes, possibly related to the patient's reported history of sarcoidosis. Code stroke imaging results were communicated on 07/16/2020 at 11:58 am to provider Dr. ReyDoy Mincea telephone, who verbally acknowledged these results. Electronically Signed   By: FreMargaretha Sheffield   On: 07/16/2020 12:17   DG Chest Port 1 View  Result Date: 07/14/2020 CLINICAL DATA:  Intubated EXAM: PORTABLE CHEST 1 VIEW COMPARISON:  07/14/2020, 07/13/2020, CT chest 07/10/2020 FINDINGS: Endotracheal tube tip is about 3 cm superior to the carina. Treated compression deformities of the spine. Bilateral pulmonary fibrosis with hilar retraction and enlargement. Stable cardiomediastinal silhouette. No pneumothorax. IMPRESSION: Endotracheal tube tip about 3 cm superior to the carina. Pulmonary fibrosis. No significant change since radiograph performed earlier today. Electronically Signed   By: KimDonavan Foil.D.   On: 07/14/2020 22:06   DG Chest Port 1 View  Result Date: 07/14/2020 CLINICAL DATA:  Intubated EXAM: PORTABLE CHEST 1 VIEW COMPARISON:  07/13/2020, 07/09/2020, CT chest 07/10/2020, chest x-ray 10/26/2017 FINDINGS: Interval intubation, tip of the endotracheal tube is about 3.9 cm superior to the carina. Treated thoracic compression fractures. Bilateral pulmonary fibrosis, apical dominant compatible with history of sarcoid. Hilar retraction and enlargement. No pneumothorax or pleural effusion. IMPRESSION: 1. Endotracheal tube tip about 3.9 cm superior to the carina. 2. Chronic pulmonary fibrosis with hilar retraction and enlargement. No definite acute interval change compared to most recent prior. Electronically Signed   By: KimDonavan FoilD.   On: 07/14/2020 20:43   DG OR UROLOGY CYSTO IMAGE ([facility] ONLY)  Result Date: 07/14/2020 There is no interpretation for this exam.  This order is for images obtained during a surgical procedure.  Please See "Surgeries" Tab for more information regarding the procedure.   CT HEAD CODE STROKE WO CONTRAST  Result Date: 07/16/2020 CLINICAL DATA:  Stroke suspected.  Unable to move right side. EXAM: CT ANGIOGRAPHY HEAD AND NECK CT PERFUSION BRAIN TECHNIQUE: Multidetector CT imaging of the head and neck was performed using the standard protocol during bolus administration of intravenous contrast. Multiplanar CT image reconstructions and MIPs were obtained to evaluate the vascular anatomy. Carotid stenosis measurements (when applicable) are obtained utilizing NASCET criteria, using the distal internal carotid diameter as the denominator. Multiphase CT imaging of the brain was performed following IV bolus contrast injection. Subsequent parametric perfusion maps were calculated using RAPID software. CONTRAST:  100m80mNIPAQUE IOHEXOL 350 MG/ML SOLN COMPARISON:  CT head 07/10/2020 FINDINGS: CT HEAD FINDINGS Brain: Evaluation is limited by patient motion. There is no  definite evidence of acute large vascular territory infarct. There is similar extensive patchy white matter hypoattenuation, most likely the sequela of chronic microvascular ischemic disease. No acute hemorrhage. No abnormal mass effect or mass lesion. No hydrocephalus. Vascular: No hyperdense vessel identified. Calcific atherosclerosis. Skull: No evidence of acute fracture. Sinuses/Orbits: Limit evaluation without evidence of  fracture or focal lesion. Other: None. ASPECTS [facility] Stroke Program Early CT Score) Total score (0-10 with 10 being normal): 10 Review of the MIP images confirms the above findings CTA NECK FINDINGS Nondiagnostic arterial evaluation in the upper neck/skull base, including the internal carotid arteries in the neck and mid to distal vertebral arteries. Aortic arch: Calcified atherosclerosis without evidence of significant stenosis or aneurysm. Right carotid system: The visualized common carotid artery is within normal limits without significant stenosis or occlusion. Nondiagnostic evaluation of the distal common carotid artery and internal carotid artery to the level of the petrous carotid. Left carotid system: Calcified and noncalcified atherosclerosis of the common carotid artery without evidence of significant stenosis. Nondiagnostic evaluation of the distal common carotid artery and internal carotid artery to the level of the petrous carotid. Vertebral arteries: Approximately, no significant stenosis or occlusion. Nondiagnostic evaluation of the mid to distal vertebral arteries. Skeleton: Multilevel severe degenerative change. Other neck: No evidence of mass or adenopathy. Upper chest: Partially imaged fibrotic lung changes, possibly related to the patient's history of sarcoidosis. Review of the MIP images confirms the above findings CTA HEAD FINDINGS Anterior circulation: Calcific atherosclerosis of bilateral cavernous carotids without evidence of greater than 50% narrowing. Bilateral M1  MCAs and proximal M2 MCA is are patent without evidence of significant stenosis. Distal MCA branches appear relatively symmetric. The right A1 ACA is hypoplastic with a large left A1 ACA, likely anatomic variation. Posterior circulation: No significant stenosis or occlusion of the distal V4 vertebral arteries and basilar artery. Fetal type right PCA. No significant stenosis identified of the posterior cerebral arteries. No aneurysm. Venous sinuses: As permitted by contrast timing, [email or web address] Review of the MIP images confirms the above findings CT Brain Perfusion Findings: ASPECTS: 10 CBF (<30%) Volume: 354m Perfusion (Tmax>6.0s) volume: 628mMismatch Volume: 54m91malculated T-max greater than 6 sec and mismatch is favored artifactual given location in a region of streak/motion and non vascular territory. IMPRESSION: 1. Overall limited evaluation given patient motion with nondiagnostic arterial evaluation in the upper neck/skull base, including the internal carotid arteries and mid to distal vertebral arteries in the neck. Within this limitation, no evidence of acute intracranial abnormality, large vessel occlusion or significant (greater than 50%) arterial stenosis. Repeat CTA neck could further evaluate if clinically indicated. 2. No convincing penumbra. Small area of reported mismatch is favored artifactual. 3. Partially imaged fibrotic lung changes, possibly related to the patient's reported history of sarcoidosis. Code stroke imaging results were communicated on 07/16/2020 at 11:58 am to provider Dr. ReyDoy Mincea telephone, who verbally acknowledged these results. Electronically Signed   By: FreMargaretha Sheffield   On: 07/16/2020 12:17     Medications:   . sodium chloride 20 mL/hr at 07/11/20 1400  . sodium chloride    . sodium chloride    . calcium gluconate 1,000 mg (07/16/20 0828)  . cefTRIAXone (ROCEPHIN)  IV Stopped (07/15/20 1741)  . dexmedetomidine (PRECEDEX) IV infusion Stopped (07/15/20 0655)  .  dextrose 125 mL/hr at 07/16/20 1250  . fentaNYL infusion INTRAVENOUS Stopped (07/15/20 1023)  . phenylephrine (NEO-SYNEPHRINE) Adult infusion Stopped (07/15/20 1703)   . arformoterol  15 mcg Nebulization BID  . aspirin EC  81 mg Oral Daily  . budesonide (PULMICORT) nebulizer solution  0.25 mg Nebulization BID  . chlorhexidine  15 mL Mouth Rinse BID  . chlorhexidine gluconate (MEDLINE KIT)  15 mL Mouth Rinse BID  . Chlorhexidine Gluconate Cloth  6 each Topical Q0600  . docusate  100  mg Oral BID  . insulin aspart  0-9 Units Subcutaneous Q4H  . mouth rinse  15 mL Mouth Rinse 10 times per day  . methylPREDNISolone (SOLU-MEDROL) injection  40 mg Intravenous Daily  . pantoprazole (PROTONIX) IV  40 mg Intravenous QHS  . polyethylene glycol  17 g Oral Daily  . thiamine injection  100 mg Intravenous Daily  . Treprostinil  18 mcg Inhalation QID   acetaminophen, docusate sodium, fentaNYL, polyethylene glycol  Assessment/ Plan:  64 y.o. female with  sarcoidosis, interstitial lung disease, obstructive sleep apnea, depression, GERD, hyperlipidemia, hypothyroidism   admitted on 07/09/2020 for Elevated LFTs [R79.89] Sepsis ([facility]) [A41.9] Sepsis, due to unspecified organism, unspecified whether acute organ dysfunction present [facility]) [A41.9]  # AKI Baseline Cr 1.0 in May 2021 Admi Cr 4.16, peaked at 4.57 Creatinine today is 1.85,improving   #Sepsis from urinary source Klebsiella pneumonia and blood in urine Patient has significant history of renal stones.  History of left ureteroscopy May 2021  CT Abd/Pelvis on  07/14/20 IMPRESSION: 7-8 mm proximal right ureteral stone with obstructive change and perinephric stranding. Urology placed ureteral stents bilaterally on 07/14/20 F/C draining blood tinged urine   #History of pulmonary sarcoidosis, pulmonary hypertension Acute on chronic respiratory failure, community-acquired pneumonia  requires home oxygen and CPAP Was intubated and mechanically  ventilated yesterday Currently on 2L of O2,maintaining SpO2 above 90% Management as per pulmonary/ICU team   LOS: [place] 9/30/20213:50 PM  [place], [place]  Note: This note was prepared with Dragon dictation. Any transcription errors are unintentional

## 2020-07-16 NOTE — Progress Notes (Signed)
*  PRELIMINARY RESULTS* Echocardiogram 2D Echocardiogram has been performed.  Sherrie Sport 07/16/2020, 2:41 PM

## 2020-07-16 NOTE — Consult Note (Addendum)
Requesting Physician: Lanney Gins    Chief Complaint: Right sided weakness, slurred speech  I have been asked by Dr. Lanney Gins to see this patient in consultation for code stroke.  HPI: Stacie [name] is an 64 y.o. female with a history of arrhythmia, sarcoid and pulmonary hypertension who was admitted on 9/24 with community-acquired pneumonia and urinary tract infection.  Felt to be septic, possible DIC.  Patient altered at presentation.  Required BiPAP.  Today on evaluation was felt to be more dysarthric.  Questionable weakness.  Code stroke initiated.    Preadmission modified rankin of 0.   Initial NIHSS of 16.    Date last known well: Unable to determine Time last known well: Unable to determine tPA Given: No: Unable to determine LKW  Past Medical History:  Diagnosis Date  . Arrhythmia    patient unaware if this is current  . Asthma   . GERD (gastroesophageal reflux disease)   . Heart murmur   . History of kidney stones   . HOH (hard of hearing)    wear aids  . Hypothyroidism   . IBS (irritable bowel syndrome)   . Pulmonary hypertension ([place])   . Sarcoid   . Sarcoidosis   . Seasonal allergies   . Sleep apnea CPAP with O2  . Wears hearing aid in both ears     Past Surgical History:  Procedure Laterality Date  . CARDIAC CATHETERIZATION  10/18/2018   [facility]  . CATARACT EXTRACTION W/PHACO Left 07/31/2019   Procedure: CATARACT EXTRACTION PHACO AND INTRAOCULAR LENS PLACEMENT (IOC) LEFT 00:51.1  17.9%  9.15;  Surgeon: Leandrew Koyanagi, MD;  Location: [place];  Service: Ophthalmology;  Laterality: Left;  keep this patient second  . COLON SURGERY     "colon was fused to bladder - operated on both"  . COLONOSCOPY  09/18/2007   diverticuli, no polyps  . COLONOSCOPY  05/26/2010   diverticuli, no polyps  . CYSTOSCOPY WITH STENT PLACEMENT Bilateral 07/14/2020   Procedure: CYSTOSCOPY WITH STENT PLACEMENT, RETROPYLOGRAM;  Surgeon: Billey Co, MD;  Location: [facility]  ORS;  Service: Urology;  Laterality: Bilateral;  . CYSTOSCOPY/URETEROSCOPY/HOLMIUM LASER/STENT PLACEMENT Left 02/20/2020   Procedure: CYSTOSCOPY/URETEROSCOPY/LITHOTRIPSY /STENT PLACEMENT;  Surgeon: Hollice Espy, MD;  Location: [facility] ORS;  Service: Urology;  Laterality: Left;  . PARS PLANA VITRECTOMY Right 05/20/2015   Procedure: PARS PLANA VITRECTOMY WITH 25 GAUGE, laser;  Surgeon: Milus Height, MD;  Location: [facility] ORS;  Service: Ophthalmology;  Laterality: Right;  . PARTIAL HYSTERECTOMY  1990  . TUBAL LIGATION      Family History  Problem Relation Age of Onset  . Allergies Father   . Asthma Father   . Colon cancer Father   . Allergies Brother   . Asthma Brother   . Breast cancer Maternal Grandmother    Social History:  reports that she has never smoked. She has never used smokeless tobacco. She reports that she does not drink alcohol and does not use drugs.  Allergies:  Allergies  Allergen Reactions  . Corn-Containing Products Diarrhea    headaches  . Nitrofurantoin Nausea Only  . Tramadol     Nausea and vomiting  . Sulfa Antibiotics Rash    As an infant    Medications:  I have reviewed the patient's current medications. Scheduled: . arformoterol  15 mcg Nebulization BID  . budesonide (PULMICORT) nebulizer solution  0.25 mg Nebulization BID  . chlorhexidine  15 mL Mouth Rinse BID  . chlorhexidine gluconate (MEDLINE KIT)  15  mL Mouth Rinse BID  . Chlorhexidine Gluconate Cloth  6 each Topical Q0600  . docusate  100 mg Oral BID  . insulin aspart  0-9 Units Subcutaneous Q4H  . mouth rinse  15 mL Mouth Rinse 10 times per day  . methylPREDNISolone (SOLU-MEDROL) injection  40 mg Intravenous Daily  . pantoprazole (PROTONIX) IV  40 mg Intravenous QHS  . polyethylene glycol  17 g Oral Daily  . thiamine injection  100 mg Intravenous Daily  . Treprostinil  18 mcg Inhalation QID    ROS: Unable to obtain due to mental status  Physical Examination: Blood pressure 136/76,  pulse 96, temperature 98.3 F (36.8 C), temperature source Axillary, resp. rate 15, height 5' 7.01" (1.702 m), weight 87.5 kg, SpO2 99 %.  HEENT-  Normocephalic, no lesions, without obvious abnormality.  Normal external eye and conjunctiva.  Dried blood noted in mouth Cardiovascular- S1, S2 normal, pulses palpable throughout   Lungs- chest clear, no wheezing, rales, normal symmetric air entry Abdomen- soft, non-tender; bowel sounds normal; no masses,  no organomegaly Extremities- no edema Lymph-no adenopathy palpable Musculoskeletal-no joint tenderness, deformity or swelling Skin-warm and dry, no hyperpigmentation, vitiligo, or suspicious lesions  Neurological Examination   Mental Status: Alert.  Speech dysarthric.  Unable to follow commands.  Aphasic. Cranial Nerves: II: Pupils equal, round, reactive to light and accommodation.  Blinks to bilateral confrontation III,IV, VI: Extra-ocular motions grossly intact bilaterally V,VII: right facial droop VIII: hearing normal bilaterally IX,X: gag reflex present XI: bilateral shoulder shrug XII: unable to perform Motor: Moves LUE preferentially and able to maintain lifted against gravity.  Able to move the RUE but does not maintain against gravity.  Lifts neither leg off the bed although able to flex.   Sensory: Responds to noxious stimuli bilaterally Deep Tendon Reflexes: Symmetric throughout Plantars: Right: mute   Left: mute Cerebellar: Unable to perform due to ability to follow commands Gait: not tested due to safety concerns  Laboratory Studies:  Basic Metabolic Panel: Recent Labs  Lab 07/12/20 0735 07/12/20 0735 07/13/20 0316 07/13/20 0316 07/14/20 0404 07/14/20 0404 07/15/20 0433 07/15/20 0433 07/15/20 1343 07/16/20 0706 07/16/20 0930  NA 143   < > 147*   < > 150*  --  156*  --  156* 156* 155*  K 4.8   < > 4.4   < > 4.1  --  4.5  --  4.5 4.4 4.3  CL 103   < > 107   < > 110  --  114*  --  115* 115* 114*  CO2 24   < >  27   < > 24  --  31  --  _0 GLUCOSE 164*   < > 158*   < > 188*  --  212*  --  202* 230* 237*  BUN 89*   < > 88*   < > 83*  --  76*  --  79* 76* 77*  CREATININE 3.75*   < > 3.12*   < > 2.46*  --  2.35*  --  2.18* 1.92* 1.85*  CALCIUM 7.1*   < > 6.6*   < > 7.4*   < > 7.6*   < > 7.7* 7.9* 8.1*  MG 2.8*  --  2.6*  --  2.5*  --  2.5*  --   --  2.2  --   PHOS 6.8*  --  4.3  --  5.3*  --  5.8*  --   --  3.2  --    < > = values in this interval not displayed.    Liver Function Tests: Recent Labs  Lab 07/10/20 0504 07/10/20 0504 07/10/20 1418 07/11/20 0445 07/12/20 0735 07/13/20 0316 07/14/20 0404  AST 93*  --   --  137* 190* 145* 217*  ALT 54*  --   --  97* 211* 207* 304*  ALKPHOS 54  --   --  89 102 93 101  BILITOT 1.1  --   --  0.8 0.7 0.6 0.8  PROT 5.7*  --   --  5.9* 6.1* 6.0* 6.4*  ALBUMIN 2.5*   < > 2.3* 2.5* 2.6* 2.4* 2.7*   < > = values in this interval not displayed.   No results for input(s): LIPASE, AMYLASE in the last 168 hours. Recent Labs  Lab 07/13/20 1158  AMMONIA 41*    CBC: Recent Labs  Lab 07/12/20 0735 07/12/20 0735 07/13/20 0316 07/13/20 1301 07/14/20 0404 07/15/20 0433 07/16/20 0706  WBC 33.4*  --  17.2*  --  18.6* 13.7* 19.6*  NEUTROABS 31.5*  --  15.1*  --  16.4* 11.5* 16.0*  HGB 12.0   < > 12.1 11.7 12.7 13.1 13.1  HCT 35.6*  --  37.7  --  40.6 42.6 42.8  MCV 92.2  --  95.7  --  96.9 99.1 100.2*  PLT 33*  --  24*  --  39* 64* 77*   < > = values in this interval not displayed.    Cardiac Enzymes: No results for input(s): CKTOTAL, CKMB, CKMBINDEX, TROPONINI in the last 168 hours.  BNP: Invalid input(s): POCBNP  CBG: Recent Labs  Lab 07/15/20 1913 07/15/20 2247 07/16/20 0047 07/16/20 0318 07/16/20 0749  GLUCAP 201* 233* 222* 227* 203*    Microbiology: Results for orders placed or performed during the hospital encounter of 07/09/20  Urine culture     Status: Abnormal   Collection Time: 07/09/20 11:27 PM   Specimen: In/Out  Cath Urine  Result Value Ref Range Status   Specimen Description   Final    IN/OUT CATH URINE Performed at [facility], [address] [place], [place] [address]    Special Requests   Final    NONE Performed at [facility], [address] [place], [place] [address]    Culture >=100,000 COLONIES/mL KLEBSIELLA PNEUMONIAE (A)  Final   Report Status 07/12/2020 FINAL  Final   Organism ID, Bacteria KLEBSIELLA PNEUMONIAE (A)  Final      Susceptibility   Klebsiella pneumoniae - MIC*    AMPICILLIN RESISTANT Resistant     CEFAZOLIN <=4 SENSITIVE Sensitive     CEFTRIAXONE <=0.25 SENSITIVE Sensitive     CIPROFLOXACIN <=0.25 SENSITIVE Sensitive     GENTAMICIN <=1 SENSITIVE Sensitive     IMIPENEM <=0.25 SENSITIVE Sensitive     NITROFURANTOIN <=16 SENSITIVE Sensitive     TRIMETH/SULFA <=20 SENSITIVE Sensitive     AMPICILLIN/SULBACTAM <=2 SENSITIVE Sensitive     PIP/TAZO <=4 SENSITIVE Sensitive     * >=100,000 COLONIES/mL KLEBSIELLA PNEUMONIAE  Respiratory Panel by RT PCR (Flu A&B, Covid) - Nasopharyngeal Swab     Status: None   Collection Time: 07/09/20 11:28 PM   Specimen: Nasopharyngeal Swab  Result Value Ref Range Status   SARS Coronavirus 2 by RT PCR NEGATIVE NEGATIVE Final    Comment: (NOTE) SARS-CoV-2 target nucleic acids are NOT DETECTED.  The SARS-CoV-2 RNA is generally detectable in upper respiratoy specimens during the acute  phase of infection. The lowest concentration of SARS-CoV-2 viral copies this assay can detect is 131 copies/mL. A negative result does not preclude SARS-Cov-2 infection and should not be used as the sole basis for treatment or other patient management decisions. A negative result may occur with  improper specimen collection/handling, submission of specimen other than nasopharyngeal swab, presence of viral mutation(s) within the areas targeted by this assay, and inadequate number of viral copies (<131 copies/mL). A negative result must  be combined with clinical observations, patient history, and epidemiological information. The expected result is Negative.  Fact Sheet for Patients:  PinkCheek.be  Fact Sheet for Healthcare Providers:  GravelBags.it  This test is no t yet approved or cleared by the [place] FDA and  has been authorized for detection and/or diagnosis of SARS-CoV-2 by FDA under an Emergency Use Authorization (EUA). This EUA will remain  in effect (meaning this test can be used) for the duration of the COVID-19 declaration under Section 564(b)(1) of the Act, 21 U.S.C. section 360bbb-3(b)(1), unless the authorization is terminated or revoked sooner.     Influenza A by PCR NEGATIVE NEGATIVE Final   Influenza B by PCR NEGATIVE NEGATIVE Final    Comment: (NOTE) The Xpert Xpress SARS-CoV-2/FLU/RSV assay is intended as an aid in  the diagnosis of influenza from Nasopharyngeal swab specimens and  should not be used as a sole basis for treatment. Nasal washings and  aspirates are unacceptable for Xpert Xpress SARS-CoV-2/FLU/RSV  testing.  Fact Sheet for Patients: PinkCheek.be  Fact Sheet for Healthcare Providers: GravelBags.it  This test is not yet approved or cleared by the [place] FDA and  has been authorized for detection and/or diagnosis of SARS-CoV-2 by  FDA under an Emergency Use Authorization (EUA). This EUA will remain  in effect (meaning this test can be used) for the duration of the  Covid-19 declaration under Section 564(b)(1) of the Act, 21  U.S.C. section 360bbb-3(b)(1), unless the authorization is  terminated or revoked. Performed at [facility], [address], [place], [place] [address]   Blood Culture (routine x 2)     Status: Abnormal   Collection Time: 07/09/20 11:36 PM   Specimen: BLOOD  Result Value Ref Range Status   Specimen Description    Final    BLOOD RIGHT ANTECUBITAL Performed at [facility], [address], [place], [place] [address]    Special Requests   Final    BOTTLES DRAWN AEROBIC AND ANAEROBIC Blood Culture adequate volume Performed at [facility], [address] [place], [place] [address]    Culture  Setup Time   Final    Organism ID to follow GRAM NEGATIVE RODS IN BOTH AEROBIC AND ANAEROBIC BOTTLES CRITICAL RESULT CALLED TO, READ BACK BY AND VERIFIED WITH: AMY THOMPSON AT 1220 07/10/20 SDR Performed at [facility], [address] [place], [place] [address]    Culture KLEBSIELLA PNEUMONIAE (A)  Final   Report Status 07/12/2020 FINAL  Final   Organism ID, Bacteria KLEBSIELLA PNEUMONIAE  Final      Susceptibility   Klebsiella pneumoniae - MIC*    AMPICILLIN RESISTANT Resistant     CEFAZOLIN <=4 SENSITIVE Sensitive     CEFEPIME <=0.12 SENSITIVE Sensitive     CEFTAZIDIME <=1 SENSITIVE Sensitive     CEFTRIAXONE <=0.25 SENSITIVE Sensitive     CIPROFLOXACIN <=0.25 SENSITIVE Sensitive     GENTAMICIN <=1 SENSITIVE Sensitive     IMIPENEM <=0.25 SENSITIVE Sensitive     TRIMETH/SULFA <=  20 SENSITIVE Sensitive     AMPICILLIN/SULBACTAM <=2 SENSITIVE Sensitive     PIP/TAZO <=4 SENSITIVE Sensitive     * KLEBSIELLA PNEUMONIAE  Blood Culture ID Panel (Reflexed)     Status: Abnormal   Collection Time: 07/09/20 11:36 PM  Result Value Ref Range Status   Enterococcus faecalis NOT DETECTED NOT DETECTED Final   Enterococcus Faecium NOT DETECTED NOT DETECTED Final   Listeria monocytogenes NOT DETECTED NOT DETECTED Final   Staphylococcus species NOT DETECTED NOT DETECTED Final   Staphylococcus aureus (BCID) NOT DETECTED NOT DETECTED Final   Staphylococcus epidermidis NOT DETECTED NOT DETECTED Final   Staphylococcus lugdunensis NOT DETECTED NOT DETECTED Final   Streptococcus species NOT DETECTED NOT DETECTED Final   Streptococcus agalactiae NOT DETECTED NOT DETECTED Final   Streptococcus  pneumoniae NOT DETECTED NOT DETECTED Final   Streptococcus pyogenes NOT DETECTED NOT DETECTED Final   A.calcoaceticus-baumannii NOT DETECTED NOT DETECTED Final   Bacteroides fragilis NOT DETECTED NOT DETECTED Final   Enterobacterales DETECTED (A) NOT DETECTED Final    Comment: Enterobacterales represent a large order of gram negative bacteria, not a single organism. CRITICAL RESULT CALLED TO, READ BACK BY AND VERIFIED WITH:  AMY THOMPSON AT 0277 04/09/20 SDR    Enterobacter cloacae complex NOT DETECTED NOT DETECTED Final   Escherichia coli NOT DETECTED NOT DETECTED Final   Klebsiella aerogenes NOT DETECTED NOT DETECTED Final   Klebsiella oxytoca NOT DETECTED NOT DETECTED Final   Klebsiella pneumoniae DETECTED (A) NOT DETECTED Final    Comment: CRITICAL RESULT CALLED TO, READ BACK BY AND VERIFIED WITH:  AMY THOMPSON AT 1220 07/10/20 SDR    Proteus species NOT DETECTED NOT DETECTED Final   Salmonella species NOT DETECTED NOT DETECTED Final   Serratia marcescens NOT DETECTED NOT DETECTED Final   Haemophilus influenzae NOT DETECTED NOT DETECTED Final   Neisseria meningitidis NOT DETECTED NOT DETECTED Final   Pseudomonas aeruginosa NOT DETECTED NOT DETECTED Final   Stenotrophomonas maltophilia NOT DETECTED NOT DETECTED Final   Candida albicans NOT DETECTED NOT DETECTED Final   Candida auris NOT DETECTED NOT DETECTED Final   Candida glabrata NOT DETECTED NOT DETECTED Final   Candida krusei NOT DETECTED NOT DETECTED Final   Candida parapsilosis NOT DETECTED NOT DETECTED Final   Candida tropicalis NOT DETECTED NOT DETECTED Final   Cryptococcus neoformans/gattii NOT DETECTED NOT DETECTED Final   CTX-M ESBL NOT DETECTED NOT DETECTED Final   Carbapenem resistance IMP NOT DETECTED NOT DETECTED Final   Carbapenem resistance KPC NOT DETECTED NOT DETECTED Final   Carbapenem resistance NDM NOT DETECTED NOT DETECTED Final   Carbapenem resist OXA 48 LIKE NOT DETECTED NOT DETECTED Final   Carbapenem  resistance VIM NOT DETECTED NOT DETECTED Final    Comment: Performed at [facility], [address] [place], [place] [address]  Blood Culture (routine x 2)     Status: Abnormal   Collection Time: 07/09/20 11:37 PM   Specimen: BLOOD  Result Value Ref Range Status   Specimen Description   Final    BLOOD BLOOD RIGHT FOREARM Performed at [facility], [address], [place], [place] [address]    Special Requests   Final    BOTTLES DRAWN AEROBIC AND ANAEROBIC Blood Culture adequate volume Performed at [facility], [address] [place], [place] [address]    Culture  Setup Time   Final    GRAM NEGATIVE RODS IN BOTH AEROBIC AND ANAEROBIC BOTTLES CRITICAL RESULT CALLED TO, READ BACK BY  AND VERIFIED WITH: AMY THOMPSON AT 1220 07/10/20 SDR Performed at [facility], [address] [place], [place] [address]    Culture (A)  Final    KLEBSIELLA PNEUMONIAE SUSCEPTIBILITIES PERFORMED ON PREVIOUS CULTURE WITHIN THE LAST 5 DAYS. Performed at [facility], [place] [address] [place], [place] [address]    Report Status 07/12/2020 FINAL  Final  Aspergillus Ag, BAL/Serum     Status: None   Collection Time: 07/10/20  5:04 AM   Specimen: Blood  Result Value Ref Range Status   Aspergillus Ag, BAL/Serum 0.03 0.00 - 0.49 Index Final    Comment: (NOTE) Performed At: [facility] [place], [place] [id number] Rush Farmer MD BP:[id number]   MRSA PCR Screening     Status: None   Collection Time: 07/10/20 12:54 PM   Specimen: Nasal Mucosa; Nasopharyngeal  Result Value Ref Range Status   MRSA by PCR NEGATIVE NEGATIVE Final    Comment:        The GeneXpert MRSA Assay (FDA approved for NASAL specimens only), is one component of a comprehensive MRSA colonization surveillance program. It is not intended to diagnose MRSA infection nor to guide or monitor treatment for MRSA infections. Performed at [facility], [address] [place], [place] [address]     Coagulation Studies: Recent Labs    07/13/20 1158 07/14/20 1334  LABPROT 16.9* 15.8*  INR 1.4* 1.3*    Urinalysis:  Recent Labs  Lab 07/09/20 2327  COLORURINE YELLOW*  LABSPEC 1.009  PHURINE 6.0  GLUCOSEU NEGATIVE  HGBUR SMALL*  BILIRUBINUR NEGATIVE  KETONESUR NEGATIVE  PROTEINUR 100*  NITRITE NEGATIVE  LEUKOCYTESUR MODERATE*    Lipid Panel:    Component Value Date/Time   CHOL 215 (H) 05/27/2019 0947   TRIG 155 (H) 05/27/2019 0947   HDL 59 05/27/2019 0947   CHOLHDL 3.6 05/27/2019 0947   LDLCALC 125 (H) 05/27/2019 0947    HgbA1C:  Lab Results  Component Value Date   HGBA1C 6.0 (H) 04/20/2017    Urine Drug Screen:  No results found for: LABOPIA, COCAINSCRNUR, LABBENZ, AMPHETMU, THCU, LABBARB  Alcohol Level: No results for input(s): ETH in the last 168 hours.  Other results: EKG: sinus tachycardia at 111 bpm.  Imaging: CT ABDOMEN PELVIS WO CONTRAST  Result Date: 07/14/2020 CLINICAL DATA:  Worsening bacteremia and fevers, initial encounter EXAM: CT ABDOMEN AND PELVIS WITHOUT CONTRAST TECHNIQUE: Multidetector CT imaging of the abdomen and pelvis was performed following the standard protocol without IV contrast. COMPARISON:  Ultrasound from the previous day, CT from 07/10/2020. FINDINGS: Lower chest: Some fibrotic changes are noted in the bases bilaterally consistent with the patient's given clinical history of sarcoidosis. Small pleural effusions are noted bilaterally new from the prior exam. Hepatobiliary: Liver and gallbladder are within normal limits. Minimal perihepatic fluid is noted. Pancreas: Unremarkable. No pancreatic ductal dilatation or surrounding inflammatory changes. Spleen: Normal in size without focal abnormality. Adrenals/Urinary Tract: Adrenal glands are within normal limits. Left kidney shows no renal calculi or obstructive changes. The left ureter is within normal limits. The bladder is decompressed by  Foley catheter. Right kidney demonstrates significant perinephric stranding with mild hydronephrosis and proximal hydroureter. Tiny nonobstructing stone is noted in the lower pole. In the proximal right ureter there is a 7-8 mm stone identified causing the obstructive change as well as the perinephric stranding. These inflammatory changes surround the ureter throughout its course. The more distal ureter shows no obstructive change. Stomach/Bowel: No obstructive or  inflammatory changes of the colon are seen. The appendix is within normal limits. No inflammatory changes are seen. Small bowel and stomach are unremarkable with the exception of a small duodenal diverticulum adjacent to the head of the pancreas. Vascular/Lymphatic: Aortic atherosclerosis. No enlarged abdominal or pelvic lymph nodes. Reproductive: Status post hysterectomy. No adnexal masses. Other: Mild free fluid is noted within the abdomen. Musculoskeletal: Degenerative changes of lumbar spine are noted. IMPRESSION: 7-8 mm proximal right ureteral stone with obstructive change and perinephric stranding. Minimal free fluid within the abdomen and pelvis. Chronic changes in the lung bases consistent with the given clinical history of sarcoidosis. Electronically Signed   By: Inez Catalina M.D.   On: 07/14/2020 16:08   DG Abd 1 View  Result Date: 07/15/2020 CLINICAL DATA:  Abdominal distension. EXAM: ABDOMEN - 1 VIEW COMPARISON:  November 18, 2014. FINDINGS: The bowel gas pattern is normal. Bilateral ureteral stents are noted. Surgical clips are seen overlying the sacrum. No radio-opaque calculi or other significant radiographic abnormality are seen. IMPRESSION: No evidence of bowel obstruction or ileus. Electronically Signed   By: Marijo Conception M.D.   On: 07/15/2020 08:28   US ARTERIAL LOWER EXTREMITY DUPLEX BILATERAL  Result Date: 07/15/2020 CLINICAL DATA:  64 year old female with history of critical limb ischemia. EXAM: BILATERAL LOWER EXTREMITY  ARTERIAL DUPLEX SCAN TECHNIQUE: Gray-scale sonography as well as color Doppler and duplex ultrasound was performed to evaluate the arteries of both lower extremities including the common, superficial and profunda femoral arteries, popliteal artery and calf arteries. COMPARISON:  None. FINDINGS: Right Lower Extremity ABI: Not obtained. Inflow: Normal common femoral arterial waveforms and velocities. No evidence of inflow (aortoiliac) disease. Outflow: Normal profunda femoral, superficial femoral and popliteal arterial waveforms and velocities. No focal elevation of the PSV to suggest stenosis. Runoff: Normal posterior and anterior tibial arterial waveforms and velocities. Vessels are patent to the ankle. Left Lower Extremity ABI: Not obtained. Inflow: Normal common femoral arterial waveforms and velocities. No evidence of inflow (aortoiliac) disease. Outflow: Normal profunda femoral, superficial femoral and popliteal arterial waveforms and velocities. No focal elevation of the PSV to suggest stenosis. Runoff: Normal posterior and anterior tibial arterial waveforms and velocities. Vessels are patent to the ankle. IMPRESSION: Patent bilateral lower extremity arteries with normal waveforms and no evidence of significant atherosclerosis or flow limiting stenosis. Ruthann Cancer, MD [facility] Radiology Electronically Signed   By: Ruthann Cancer MD   On: 07/15/2020 07:42   DG Chest Port 1 View  Result Date: 07/14/2020 CLINICAL DATA:  Intubated EXAM: PORTABLE CHEST 1 VIEW COMPARISON:  07/14/2020, 07/13/2020, CT chest 07/10/2020 FINDINGS: Endotracheal tube tip is about 3 cm superior to the carina. Treated compression deformities of the spine. Bilateral pulmonary fibrosis with hilar retraction and enlargement. Stable cardiomediastinal silhouette. No pneumothorax. IMPRESSION: Endotracheal tube tip about 3 cm superior to the carina. Pulmonary fibrosis. No significant change  since radiograph performed earlier today. Electronically Signed   By: Donavan Foil M.D.   On: 07/14/2020 22:06   DG Chest Port 1 View  Result Date: 07/14/2020 CLINICAL DATA:  Intubated EXAM: PORTABLE CHEST 1 VIEW COMPARISON:  07/13/2020, 07/09/2020, CT chest 07/10/2020, chest x-ray 10/26/2017 FINDINGS: Interval intubation, tip of the endotracheal tube is about 3.9 cm superior to the carina. Treated thoracic compression fractures. Bilateral pulmonary fibrosis, apical dominant compatible with history of sarcoid. Hilar retraction and enlargement. No pneumothorax or pleural effusion. IMPRESSION: 1. Endotracheal tube tip about 3.9 cm superior to the carina.  2. Chronic pulmonary fibrosis with hilar retraction and enlargement. No definite acute interval change compared to most recent prior. Electronically Signed   By: Donavan Foil M.D.   On: 07/14/2020 20:43   DG OR UROLOGY CYSTO IMAGE ([facility] ONLY)  Result Date: 07/14/2020 There is no interpretation for this exam.  This order is for images obtained during a surgical procedure.  Please See "Surgeries" Tab for more information regarding the procedure.    Assessment: 64 y.o. female with a history of arrhythmia, sarcoid and pulmonary hypertension who was admitted on 9/24 with community-acquired pneumonia and urinary tract infection.  Felt to be septic, possible DIC.  Patient altered at presentation.  Required BiPAP.  Today on evaluation was felt to be more dysarthric.  Questionable weakness.  Code stroke initiated.    Preadmission modified rankin of 0.  Initial NIHSS of 16.   Patient not a tPA candidate due to unclear LKW.  To be evaluated for possible benefit that could be obtained of thrombectomy. CTA and CTP ordered as well as STAT head CT without contrast. Head CT personally reviewed and although with artifact shows no acute changes.  CTA personally reviewed and shows no evidence of LVO.  Some proximal vasculature difficult to evaluate due to quality of  study secondary to patient cooperation.  CTP personally reviewed and shows no areas of significant hypoperfusion.     Stroke Risk Factors - sleep apnea  Plan: 1. HgbA1c, fasting lipid panel.  Goal A1c<7.0, goal LDL<70 2. MRI of the brain without contrast pending 3. PT consult, OT consult, Speech consult 4. Echocardiogram with bubble study 5. Carotid dopplers to evaluate proximal vasculature 6. Patient has been having issues with bleeding.  Once cleared may start ASA 42m daily 7. NPO until RN stroke swallow screen 8. Telemetry monitoring 9. Frequent neuro checks 10. Avoid hypotension 11. EEG   LAlexis Goodell MD Neurology [id number]/30/2021, 11:02 AM

## 2020-07-16 NOTE — Progress Notes (Signed)
Ch arrived at room in response to [facility]. Pt was not in th room at this time. Ch passed visit onto Chaplain. Stacie [name] to follow up.

## 2020-07-16 NOTE — Progress Notes (Addendum)
PHARMACY CONSULT NOTE - FOLLOW UP  Pharmacy Consult for Electrolyte Monitoring and Replacement   Recent Labs: Potassium (mmol/L)  Date Value  07/16/2020 4.3  12/28/2012 3.4 (L)   Magnesium (mg/dL)  Date Value  07/16/2020 2.2   Calcium (mg/dL)  Date Value  07/16/2020 8.1 (L)   Calcium, Total (mg/dL)  Date Value  12/28/2012 8.0 (L)   Albumin (g/dL)  Date Value  07/16/2020 2.6 (L)  05/27/2019 4.1  12/26/2012 3.0 (L)   Phosphorus (mg/dL)  Date Value  07/16/2020 3.2   Sodium (mmol/L)  Date Value  07/16/2020 154 (H)  05/27/2019 144  12/28/2012 142    Assessment: Pharmacy consulted to replace electrolytes in this 64 year old female admitted with sepsis of unknown source.. Pt with PMH of Stage 3 CKD and baseline Scr of 1.0 in May 2021. Nephrology is following.  9/27 Electrolytes WNL, with sodium slightly elevated at 147, on NS at 75 mL/hr ordered to stop tonight at 2359 . Scr elevated but trending down (4.49>>3.75>>3.12) with good UOP yesterday.Pt placed on Calcium Gluconate 1g twice daily.  9/28: Sodium continues to trend up, at 150 today. Not on maintenance fluids. Calcium improving on calcium gluconate 1g twice daily. Kidney function improving with good UOP.   9/29 D5 at 40 ml/hr added for hypernatremia, Na 150>156  9/30 Na 156>>155, D5 increased to 75 mL/hr overnight. Other electrolytes WNL. Pt NPO. Scr trending down 2.46>>2.18>>1.92 and having good UOP.   Goal of Therapy:  Electrolytes WNL   Plan:  -D5w was decreased from 125 mL/hr to 75 mL/hr this afternoon after improvement in Na from 155 >> 150. Evening Na has now increased to 154. Discussed with provider, will increased D5w to 100 mL/hr -Recheck electrolytes with AM labs  Benita Gutter 07/16/2020 11:32 PM

## 2020-07-16 NOTE — Progress Notes (Signed)
ID Pt was not to have slurred speech and rt sided weakness this morning and a code stroke was called. Seen by neurologist and CT/MRI done No fever in 24 hrs  Patient Vitals for the past 24 hrs:  BP Temp Temp src Pulse Resp SpO2 Weight  07/16/20 1000 136/76 -- -- 96 15 99 % --  07/16/20 0900 -- -- -- 96 13 94 % --  07/16/20 0834 -- -- -- -- -- 93 % --  07/16/20 0800 (!) 150/73 98.3 F (36.8 C) Axillary 96 15 100 % --  07/16/20 0753 -- -- -- -- -- 98 % --  07/16/20 0750 -- -- -- -- -- 99 % --  07/16/20 0600 -- -- -- 96 14 100 % --  07/16/20 0500 (!) 148/79 -- -- 83 12 100 % 87.5 kg  07/16/20 0400 (!) 154/86 99.2 F (37.3 C) Rectal 94 10 100 % --  07/16/20 0300 (!) 152/81 -- -- 93 16 99 % --  07/16/20 0200 (!) 147/82 -- -- 96 (!) 9 100 % --  07/16/20 0100 (!) 142/82 -- -- 94 (!) 9 100 % --  07/16/20 0000 (!) 157/84 -- -- 95 10 100 % --  07/15/20 2300 (!) 144/84 99 F (37.2 C) Rectal 95 (!) 7 99 % --  07/15/20 2200 (!) 144/80 -- -- 92 10 99 % --  07/15/20 2154 -- -- -- 96 12 100 % --  07/15/20 2100 134/74 -- -- 93 11 100 % --  07/15/20 2000 (!) 143/72 -- -- 93 20 100 % --  07/15/20 1914 -- 98.5 F (36.9 C) Axillary -- -- -- --  07/15/20 1900 (!) 143/83 -- -- 91 18 99 % --  07/15/20 1800 134/75 -- -- 90 20 100 % --  07/15/20 1730 137/79 -- -- (!) 109 18 99 % --  07/15/20 1700 129/68 -- -- 96 (!) 28 97 % --  07/15/20 1630 133/67 -- -- 91 (!) 31 97 % --  07/15/20 1600 131/76 98.7 F (37.1 C) Rectal 90 (!) 25 98 % --  07/15/20 1530 134/73 -- -- 91 20 98 % --  07/15/20 1500 127/77 -- -- 83 18 98 % --  07/15/20 1430 133/71 -- -- 87 19 99 % --  07/15/20 1400 127/68 -- -- 83 17 100 % --  07/15/20 1330 118/67 -- -- 77 15 99 % --  07/15/20 1300 132/69 -- -- 96 13 100 % --  07/15/20 1230 (!) 118/56 -- -- 93 19 90 % --  07/15/20 1200 123/63 98.4 F (36.9 C) Rectal 71 18 97 % --  07/15/20 1130 127/61 -- -- 72 18 97 % --  07/15/20 1100 (!) 116/52 -- -- 72 18 95 % --   O/e awake But  not focusing On asking questions she responds to some , but slow and drawn speech spontaneously moves upper limbs- rt < left   CBC Latest Ref Rng & Units 07/16/2020 07/15/2020 07/14/2020  WBC 4.0 - 10.5 K/uL 19.6(H) 13.7(H) 18.6(H)  Hemoglobin 12.0 - 15.0 g/dL 13.1 13.1 12.7  Hematocrit 36 - 46 % 42.8 42.6 40.6  Platelets 150 - 400 K/uL 77(L) 64(L) 39(L)    CMP Latest Ref Rng & Units 07/16/2020 07/16/2020 07/15/2020  Glucose 70 - 99 mg/dL 237(H) 230(H) 202(H)  BUN 8 - 23 mg/dL 77(H) 76(H) 79(H)  Creatinine 0.44 - 1.00 mg/dL 1.85(H) 1.92(H) 2.18(H)  Sodium 135 - 145 mmol/L 155(H) 156(H) 156(H)  Potassium 3.5 - 5.1 mmol/L  4.3 4.4 4.5  Chloride 98 - 111 mmol/L 114(H) 115(H) 115(H)  CO2 22 - 32 mmol/L _0 Calcium 8.9 - 10.3 mg/dL 8.1(L) 7.9(L) 7.7(L)  Total Protein 6.5 - 8.1 g/dL - - -  Total Bilirubin 0.3 - 1.2 mg/dL - - -  Alkaline Phos 38 - 126 U/L - - -  AST 15 - 41 U/L - - -  ALT 0 - 44 U/L - - -    Micro 9/23 BC- klebsiella UC-klebsiella   Impression/recommendation  New encephalopathy and slurred speech- stroke questioned - brain imagingnothing acture but has chronic microvascular disease  Klebsiella bacteremia with sepsis due to UTI with rt pyelonpehritis due to rt obstructing ureteric stone causing hydo S/p stent No fever in 24 hrs but uptick in WBC today to 19. Repeat blood culture- may need imaging if WBC does not decrease Continue ceftriaxone for near pan sensitive klebsiella  She was investigated for tick born illness as she was spiking and before we knew that she had obstructing stones- RMSF iGM positive but that is false positive - will not treat  Hypernatremia  Severe thrombocytopenia- r/o HIT/ sepsis- improving  Abnormal lfts- could be methotrexate, sepsis-improving  AKi- on CKD- improving  Pulmonary sarcoidosis on methylprednsione ( was on methotrexate at home) Has home oxygen  H/o left ureteric stone s/p lithotripsy and stent in May 2021 - stent  removed after that H/o Over active bladder, recurrent UTI  Left foot ecchymosis- recent trauma from car door  Discussed with intensivist

## 2020-07-16 NOTE — Progress Notes (Addendum)
PHARMACY CONSULT NOTE - FOLLOW UP  Pharmacy Consult for Electrolyte Monitoring and Replacement   Recent Labs: Potassium (mmol/L)  Date Value  07/16/2020 4.3  12/28/2012 3.4 (L)   Magnesium (mg/dL)  Date Value  07/16/2020 2.2   Calcium (mg/dL)  Date Value  07/16/2020 8.1 (L)   Calcium, Total (mg/dL)  Date Value  12/28/2012 8.0 (L)   Albumin (g/dL)  Date Value  07/14/2020 2.7 (L)  05/27/2019 4.1  12/26/2012 3.0 (L)   Phosphorus (mg/dL)  Date Value  07/16/2020 3.2   Sodium (mmol/L)  Date Value  07/16/2020 155 (H)  05/27/2019 144  12/28/2012 142    Assessment: Pharmacy consulted to replace electrolytes in this 64 year old female admitted with sepsis of unknown source.. Pt with PMH of Stage 3 CKD and baseline Scr of 1.0 in May 2021. Nephrology is following.  9/27 Electrolytes WNL, with sodium slightly elevated at 147, on NS at 75 mL/hr ordered to stop tonight at 2359 . Scr elevated but trending down (4.49>>3.75>>3.12) with good UOP yesterday.Pt placed on Calcium Gluconate 1g twice daily.  9/28: Sodium continues to trend up, at 150 today. Not on maintenance fluids. Calcium improving on calcium gluconate 1g twice daily. Kidney function improving with good UOP.   9/29 D5 at 40 ml/hr added for hypernatremia, Na 150>156  9/30 Na 156>>155, D5 increased to 75 mL/hr overnight. Other electrolytes WNL. Pt NPO. Scr trending down 2.46>>2.18>>1.92 and having good UOP.   Goal of Therapy:  Electrolytes WNL   Plan:  -Increase D5 to _0  mL/hr and recheck Na q4 hours -Recheck all other electrolytes with AM labs  Jacobo Forest PharmD Candidate 2022 07/16/2020 10:44 AM

## 2020-07-16 NOTE — Progress Notes (Signed)
   07/16/20 1100  Clinical Encounter Type  Visited With Family;Patient not available  Visit Type Initial  Referral From Nurse  Consult/Referral To Chaplain  Chaplain responded to [place] after leaving another pt. Per nurse Pt was taken down MRI. When chaplain got to the room, she stroke with daughter who told her the same thing the nurse said. Chaplain asked if there was anything she could do for her and she said she was fine. Chaplain will follow up on Pt later.

## 2020-07-16 NOTE — Procedures (Signed)
ELECTROENCEPHALOGRAM REPORT   Patient: Stacie [name]       Room #: IC11A EEG No. ID: [id number] Age: 64 y.o.        Sex: female Requesting Physician: Lanney Gins Report Date:  07/16/2020        Interpreting Physician: [name]  History: [name] is an 64 y.o. female with altered mental status and right sided weakness  Medications:  Brovana, ASA, Rocephin, Colace, Insulin, Solumedrol, Tyvaso  Conditions of Recording:  This is a 21 channel routine scalp EEG performed with bipolar and monopolar montages arranged in accordance to the international 10/20 system of electrode placement. One channel was dedicated to EKG recording.  The patient is in the awake and uncooperative state.  Description:  Artifact is prominent during the recording often obscuring the background rhythm. When able to be visualized the background is slow and poorly organized.   It consists of low voltage activity in the delta-theta continuum.  For the most [name] this activity is continuous.  There are some rare short periods of generalized attenuation noted.  There are also some rare periodic discharges of triphasic morphology noted.   No epileptiform activity is noted.   Hyperventilation and ntermittent photic stimulation were not performed.   IMPRESSION: This is an abnormal EEG secondary to general background slowing with rare triphasic waves noted.  This finding may be seen with a diffuse cerebral disturbance that is etiologically nonspecific, but may include a metabolic encephalopathy, among other possibilities.  No epileptiform activity was noted.     [name], MD Neurology [phone] 07/16/2020, 5:18 PM

## 2020-07-16 NOTE — Progress Notes (Signed)
Mild RUE contrast extavasation.Site appears stable.No skin breakdown.Instructions for care given.

## 2020-07-16 NOTE — Progress Notes (Signed)
Name: Stacie [name] MRN: [id number] DOB: 01-13-1956    ADMISSION DATE:  07/09/2020 INITIAL CONSULTATION DATE:  07/10/2020  REFERRING MD :  Dr. Owens Shark  CHIEF COMPLAINT:  Altered Mental Status & Shortness of Breath  BRIEF PATIENT DESCRIPTION:  64 y.o. Female admitted with Sepsis and Acute on Chronic Hypoxic Respiratory Failure in the setting of Community Acquired Pneumonia and Acute Exacerbation of Pulmonary Sarcoidosis requiring BiPAP. Pt also with UTI and AKI, Nephrology has been consulted.  SIGNIFICANT EVENTS  9/23: Presented to ED, placed on BiPAP 9/24: PCCM asked to admit 9/25: Klebsiella pneumoniae bacteremia due to UTI, associated encephalopathy 9/26: Persistent encephalopathy 9/27- Patient remains encephalopathic on BIPAP, she is oozing blood from peripheral veins and both LE toes are with ischemic changes which is new per husband.  I have called and updated Dr Kathyrn Sheriff regarding patients condition.  Concern is possible DIC onset since INR is incrementing up and PIV oozing on exam.  Patient is not on vasopressor support at this time. Restarted solumedrol due to chronic steroid therapy at home. Discussed case with Dr Candiss Norse nephrology and Dr Duwayne Heck.   9/28- patient with improved platelets this am, LFTs abnormal will have [place] RUQ today. ID on case s/p evaluation. Patient off sedation and is able to use BIPAP with AVAPS setting no overnight events.  Met with family at bedside including husband Dr Kathyrn Sheriff and daughter [name]. 07/15/20- patient is sedated this am we are weaning off sedatives and analgesics in prep for SBT to liberate from mechanical ventilator.  She is supported by 40 on neosynephrine this am, this is being weaned.  Na is increased to >150 plan is to start D5w and recheck BMP at 2pm.   07/16/20-  Patient has been altered with encephalopathy despite improvement in vitals and liberation off mechanical ventilation.  She was noted to have dysrarthria, right hemiparesis and right  facial drooping during my evaluation this am. Have called code stroke and neurologic evaluation is in progress s/p MRI/CTA brain and EEG.   STUDIES:  9/23: CXR>>Cardiac shadow is stable. Chronic scarring is noted in the apices bilaterally with hilar retraction consistent with the given clinical history of prior sarcoidosis. Patchy increased airspace opacities are noted in the lower lungs which may represent some acute on chronic infiltrate. Changes of prior vertebral augmentation are seen. No acute bony abnormality is noted. 9/24: CT Head>>Chronic white matter ischemic changes without acute abnormality 9/24: CT Chest w/o Contrast>>Chronic changes of pulmonary sarcoidosis with biapical predominant pulmonary fibrosis. Very mild superimposed active inflammatory infiltrate. Morphologic changes compatible with pulmonary arterial hypertension.  CULTURES: SARS-CoV-2 PCR 9/23>> negative Influenza A&B 9/23>>negative Respiratory Viral Panel 9/24>> negative Blood culture 9/23>> Klebsiella pneumoniae all 4 bottles Sputum 9/24>> unable to obtain, nonproductive Urine 9/23>> Klebsiella pneumonia Strep pneumo urinary antigen 9/24>> not obtained Legionella urinary antigen 9/24>> not obtained Aspergillus 9/24>> pending Fungitel 9/24>> pending C. Diff 9/24>> no sample  ANTIBIOTICS: Azithromycin x1 dose 9/23 Ceftazidime x1 dose 9/23 Cefepime 9/24>> 9/24 Vancomycin 9/24>> 9/24 Ceftriaxone 9/24>>  PATIENT PROFILE:   Stacie [name] is a 64 y.o. Female with a past medical history significant for pulmonary sarcoidosis (on 2L [place] at baseline), pulmonary hypertension, OSA, asthma, hypertension, hypothyroidism, IBS, and chronic kidney disease who presents to [facility] ED on 07[facility]09[facility]20 due to altered mental status and shortness of breath.  Pt is currently confused and unable to contribute to history, and no family present, therefore history is obtained from ED and nursing notes.  PCCM asked to admit patient.  Patient  has Klebsiella pneumonia bacteremia and severe sepsis.  Encephalopathy secondary to the same.   Allergies  Allergen Reactions  . Corn-Containing Products Diarrhea    headaches  . Nitrofurantoin Nausea Only  . Tramadol     Nausea and vomiting  . Sulfa Antibiotics Rash    As an infant   Scheduled Meds: . arformoterol  15 mcg Nebulization BID  . aspirin EC  81 mg Oral Daily  . budesonide (PULMICORT) nebulizer solution  0.25 mg Nebulization BID  . chlorhexidine  15 mL Mouth Rinse BID  . chlorhexidine gluconate (MEDLINE KIT)  15 mL Mouth Rinse BID  . Chlorhexidine Gluconate Cloth  6 each Topical Q0600  . docusate  100 mg Oral BID  . insulin aspart  0-9 Units Subcutaneous Q4H  . mouth rinse  15 mL Mouth Rinse 10 times per day  . methylPREDNISolone (SOLU-MEDROL) injection  40 mg Intravenous Daily  . pantoprazole (PROTONIX) IV  40 mg Intravenous QHS  . polyethylene glycol  17 g Oral Daily  . thiamine injection  100 mg Intravenous Daily  . Treprostinil  18 mcg Inhalation QID   Continuous Infusions: . sodium chloride 20 mL/hr at 07/11/20 1400  . sodium chloride    . sodium chloride    . calcium gluconate 1,000 mg (07/16/20 0828)  . cefTRIAXone (ROCEPHIN)  IV Stopped (07/15/20 1741)  . dexmedetomidine (PRECEDEX) IV infusion Stopped (07/15/20 0655)  . dextrose 75 mL/hr at 07/16/20 1645  . fentaNYL infusion INTRAVENOUS Stopped (07/15/20 1023)  . phenylephrine (NEO-SYNEPHRINE) Adult infusion Stopped (07/15/20 1703)   PRN Meds:.acetaminophen, docusate sodium, fentaNYL, polyethylene glycol   REVIEW OF SYSTEMS:   Unable to assess due to AMS  SUBJECTIVE:  Unable to assess due to AMS  VITAL SIGNS: Temp:  [97.5 F (36.4 C)-99.2 F (37.3 C)] 97.5 F (36.4 C) (09/30 1650) Pulse Rate:  [83-109] 100 (09/30 1700) Resp:  [7-20] 16 (09/30 1700) BP: (120-158)/(72-95) 151/86 (09/30 1700) SpO2:  [93 %-100 %] 98 % (09/30 1700) FiO2 (%):  [40 %] 40 % (09/30 0750) Weight:  [87.5 kg] 87.5 kg  (09/30 0500)  PHYSICAL EXAMINATION: General:  Acute on chronically ill appearing female, laying in bed, on BiPAP, with mild respiratory distress, confused Neuro: Confused, erratic following of commands, no focal deficits, pupils PERRL HEENT:  Atraumatic, normocephalic, neck supple, no JVD Cardiovascular: Regular rate and rhythm, s1s2, no M/R/G Lungs:  Diffuse rhonchi, no wheezing, mild tachypnea, mild assessory muscle use, BiPAP assisted Abdomen:  Obese, soft, nontender, nondistended, no guarding or rebound tenderness, BS+ x4 Musculoskeletal: No deformities, no edema Skin:  Warm and dry.  No obvious rashes, lesions, or ulcerations  Recent Labs  Lab 07/15/20 1343 07/15/20 1343 07/16/20 0706 07/16/20 0930 07/16/20 1329  NA 156*   < > 156* 155* 150*  K 4.5  --  4.4 4.3  --   CL 115*  --  115* 114*  --   CO2 29  --  31 31  --   BUN 79*  --  76* 77*  --   CREATININE 2.18*  --  1.92* 1.85*  --   GLUCOSE 202*  --  230* 237*  --    < > = values in this interval not displayed.   Recent Labs  Lab 07/14/20 0404 07/15/20 0433 07/16/20 0706  HGB 12.7 13.1 13.1  HCT 40.6 42.6 42.8  WBC 18.6* 13.7* 19.6*  PLT 39* 64* 77*   ABG    Component Value Date/Time  PHART 7.41 07/16/2020 0841   PCO2ART 52 (H) 07/16/2020 0841   PO2ART 76 (L) 07/16/2020 0841   HCO3 33.0 (H) 07/16/2020 0841   TCO2 29 02/20/2020 1156   ACIDBASEDEF 0.5 07/12/2020 1205   O2SAT 95.2 07/16/2020 0841     ASSESSMENT / PLAN:  Acute on Chronic Hypoxic Respiratory Failure secondary to acute Exacerbation of Pulmonary Sarcoidosis/ALI -Supplemental O2 as needed to maintain O2 sats >92% -BiPAP, wean as tolerated -High risk for intubation -Follow intermittent CXR & ABG as needed -ABX as above -IV Steroids restarted -LABA/ICS: Arformoterol and Pulmicort -continue tyvasso between BIPAP -  ongoing  Acute metabolic encephalopathy Due to severe sepsis/gram-negative bacteremia- klebsiella    - present on admission      - due to UTI Uremia Precedex General supportive care Restarted solumedrol  Support of renal function Thiamine Check free T4, TSH low -Rocephin/zithromax for abx - ID on case appreciate input 07/16/20- patient has signs of acute CVA- neuro workup in progress.   Severe sepsis secondary to Kleibsiella pneumoniae bactermia-improved  UTI, recurrent -Monitor fever curve -Trend WBC's & Procalcitonin -Follow cultures as above -Received Azithromycin & Ceftazidime in ED>>rocephin /zithromax -Discontinued Vancomycin & Cefepime -Continue Rocephin   Obstructing calculi of right kidney and nonobstructing calculi of left ureter.    - s/p surgical decompression with stent placement - Urology on case appreciate input   - creat improved this am    AKI -Anion Gap Metabolic Acidosis, corrected -Monitor I&O's / urinary output -Follow BMP -Ensure adequate renal perfusion -Avoid nephrotoxic agents as able -Replace electrolytes as indicated -Na Bicarb alternated with normal saline per renal -Nephrology consulted, appreciate input   Mildly elevated troponin, suspected demand ischemia -Continuous cardiac monitoring -Maintain MAP >65 -EKG without ST changes   Transaminitis -NPO -Trend LFT's -Obtain Abdominal US   BEST PRACTICES DISPOSITION: ICU GOALS OF CARE: Full Code VTE Prophylaxis: Heparin SQ Consults: Nephrology Updates: No family at bedside for update 07/12/20 during MD rounds  Critical care provider statement:    Critical care time (minutes):  33   Critical care time was exclusive of:  Separately billable procedures and  treating other patients   Critical care was necessary to treat or prevent imminent or  life-threatening deterioration of the following conditions:  sepsis, klebsiella bacteremia, multiple comorbid conditions.    Critical care was time spent personally by me on the following  activities:  Development of treatment plan with patient or surrogate,   discussions with consultants, evaluation of patient's response to  treatment, examination of patient, obtaining history from patient or  surrogate, ordering and performing treatments and interventions, ordering  and review of laboratory studies and re-evaluation of patient's condition   I assumed direction of critical care for this patient from another  provider in my specialty: no     Ottie Glazier, M.D.  Pulmonary [facility] [place]   07/16/2020, 5:28 PM  *This note was dictated using voice recognition software/Dragon.  Despite best efforts to proofread, errors can occur which can change the meaning.  Any change was purely unintentional.

## 2020-07-16 NOTE — Progress Notes (Signed)
eeg done

## 2020-07-17 ENCOUNTER — Inpatient Hospital Stay: Payer: BC Managed Care – PPO

## 2020-07-17 LAB — BASIC METABOLIC PANEL
Anion gap: 14 (ref 5–15)
BUN: 60 mg/dL — ABNORMAL HIGH (ref 8–23)
CO2: 31 mmol/L (ref 22–32)
Calcium: 7.9 mg/dL — ABNORMAL LOW (ref 8.9–10.3)
Chloride: 109 mmol/L (ref 98–111)
Creatinine, Ser: 1.49 mg/dL — ABNORMAL HIGH (ref 0.44–1.00)
GFR calc Af Amer: 42 mL/min — ABNORMAL LOW (ref >60)
GFR calc non Af Amer: 36 mL/min — ABNORMAL LOW (ref >60)
Glucose, Bld: 234 mg/dL — ABNORMAL HIGH (ref 70–99)
Potassium: 4.4 mmol/L (ref 3.5–5.1)
Sodium: 154 mmol/L — ABNORMAL HIGH (ref 135–145)

## 2020-07-17 LAB — HEPATIC FUNCTION PANEL
ALT: 125 U/L — ABNORMAL HIGH (ref 0–44)
AST: 60 U/L — ABNORMAL HIGH (ref 15–41)
Albumin: 2.8 g/dL — ABNORMAL LOW (ref 3.5–5.0)
Alkaline Phosphatase: 76 U/L (ref 38–126)
Bilirubin, Direct: 0.2 mg/dL (ref 0.0–0.2)
Indirect Bilirubin: 0.8 mg/dL (ref 0.3–0.9)
Total Bilirubin: 1 mg/dL (ref 0.3–1.2)
Total Protein: 5.9 g/dL — ABNORMAL LOW (ref 6.5–8.1)

## 2020-07-17 LAB — ECHOCARDIOGRAM COMPLETE BUBBLE STUDY
AR max vel: 1.99 cm2
AV Area VTI: 1.72 cm2
AV Area mean vel: 1.89 cm2
AV Mean grad: 6 mmHg
AV Peak grad: 10.6 mmHg
Ao pk vel: 1.63 m/s
Area-P 1/2: 4.8 cm2
Calc EF: 37.1 %
S' Lateral: 3.64 cm
Single Plane A2C EF: 41.2 %
Single Plane A4C EF: 35.6 %

## 2020-07-17 LAB — CBC WITH DIFFERENTIAL/PLATELET
Abs Immature Granulocytes: 1.71 10*3/uL — ABNORMAL HIGH (ref 0.00–0.07)
Basophils Absolute: 0.1 10*3/uL (ref 0.0–0.1)
Basophils Relative: 1 %
Eosinophils Absolute: 0 10*3/uL (ref 0.0–0.5)
Eosinophils Relative: 0 %
HCT: 45.7 % (ref 36.0–46.0)
Hemoglobin: 13.8 g/dL (ref 12.0–15.0)
Immature Granulocytes: 7 %
Lymphocytes Relative: 2 %
Lymphs Abs: 0.5 10*3/uL — ABNORMAL LOW (ref 0.7–4.0)
MCH: 30.5 pg (ref 26.0–34.0)
MCHC: 30.2 g/dL (ref 30.0–36.0)
MCV: 100.9 fL — ABNORMAL HIGH (ref 80.0–100.0)
Monocytes Absolute: 1.5 10*3/uL — ABNORMAL HIGH (ref 0.1–1.0)
Monocytes Relative: 6 %
Neutro Abs: 22.2 10*3/uL — ABNORMAL HIGH (ref 1.7–7.7)
Neutrophils Relative %: 84 %
Platelets: 76 10*3/uL — ABNORMAL LOW (ref 150–400)
RBC: 4.53 MIL/uL (ref 3.87–5.11)
RDW: 14.1 % (ref 11.5–15.5)
WBC: 26 10*3/uL — ABNORMAL HIGH (ref 4.0–10.5)
nRBC: 0.3 % — ABNORMAL HIGH (ref 0.0–0.2)

## 2020-07-17 LAB — GLUCOSE, CAPILLARY
Glucose-Capillary: 201 mg/dL — ABNORMAL HIGH (ref 70–99)
Glucose-Capillary: 207 mg/dL — ABNORMAL HIGH (ref 70–99)
Glucose-Capillary: 208 mg/dL — ABNORMAL HIGH (ref 70–99)
Glucose-Capillary: 217 mg/dL — ABNORMAL HIGH (ref 70–99)
Glucose-Capillary: 223 mg/dL — ABNORMAL HIGH (ref 70–99)
Glucose-Capillary: 235 mg/dL — ABNORMAL HIGH (ref 70–99)
Glucose-Capillary: 265 mg/dL — ABNORMAL HIGH (ref 70–99)

## 2020-07-17 LAB — MISC LABCORP TEST (SEND OUT): Labcorp test code: 138412

## 2020-07-17 LAB — MAGNESIUM: Magnesium: 1.9 mg/dL (ref 1.7–2.4)

## 2020-07-17 LAB — PHOSPHORUS: Phosphorus: 2.4 mg/dL — ABNORMAL LOW (ref 2.5–4.6)

## 2020-07-17 MED ORDER — INSULIN ASPART 100 UNIT/ML ~~LOC~~ SOLN
0.0000 [IU] | SUBCUTANEOUS | Status: DC
  Administered 2020-07-17 – 2020-07-18 (×6): 5 [IU] via SUBCUTANEOUS
  Administered 2020-07-18: 3 [IU] via SUBCUTANEOUS
  Administered 2020-07-18 (×4): 5 [IU] via SUBCUTANEOUS
  Administered 2020-07-19: 2 [IU] via SUBCUTANEOUS
  Administered 2020-07-19 (×2): 5 [IU] via SUBCUTANEOUS
  Administered 2020-07-19: 2 [IU] via SUBCUTANEOUS
  Administered 2020-07-19 – 2020-07-20 (×2): 3 [IU] via SUBCUTANEOUS
  Administered 2020-07-20: 2 [IU] via SUBCUTANEOUS
  Administered 2020-07-20: 5 [IU] via SUBCUTANEOUS
  Administered 2020-07-20: 3 [IU] via SUBCUTANEOUS
  Filled 2020-07-17 (×20): qty 1

## 2020-07-17 MED ORDER — LORAZEPAM 2 MG/ML IJ SOLN
1.0000 mg | Freq: Once | INTRAMUSCULAR | Status: AC
  Administered 2020-07-17: 1 mg via INTRAVENOUS
  Filled 2020-07-17: qty 1

## 2020-07-17 MED ORDER — LORAZEPAM 2 MG/ML IJ SOLN: 1.0000 mg | Freq: Every evening | INTRAMUSCULAR | Status: DC | PRN

## 2020-07-17 MED ORDER — METHYLPREDNISOLONE SODIUM SUCC 40 MG IJ SOLR
20.0000 mg | Freq: Every day | INTRAMUSCULAR | Status: DC
  Administered 2020-07-18 – 2020-07-23 (×6): 20 mg via INTRAVENOUS
  Filled 2020-07-17 (×6): qty 1

## 2020-07-17 NOTE — Progress Notes (Incomplete)
Subjective:   Exam: Vitals:   07/17/20 0900 07/17/20 1000  BP: (!) 144/79 (!) 147/75  Pulse: 97 95  Resp: 19 (!) 27  Temp:    SpO2: 100% 100%   Gen: In bed, NAD Resp: non-labored breathing, no acute distress Abd: soft, nt  Neuro: MS: *** CN:*** Motor: *** Sensory:*** DTR:***  Pertinent Labs: ***  Impression: ***  Recommendations: 1)***  Roland Rack, MD [facility] [phone]  If 7pm- 7am, please page neurology on call as listed in AMION.

## 2020-07-17 NOTE — Progress Notes (Signed)
PHARMACY CONSULT NOTE - FOLLOW UP  Pharmacy Consult for Electrolyte Monitoring and Replacement   Recent Labs: Potassium (mmol/L)  Date Value  07/17/2020 4.4  12/28/2012 3.4 (L)   Magnesium (mg/dL)  Date Value  07/17/2020 1.9   Calcium (mg/dL)  Date Value  07/17/2020 7.9 (L)   Calcium, Total (mg/dL)  Date Value  12/28/2012 8.0 (L)   Albumin (g/dL)  Date Value  07/17/2020 2.8 (L)  05/27/2019 4.1  12/26/2012 3.0 (L)   Phosphorus (mg/dL)  Date Value  07/17/2020 2.4 (L)   Sodium (mmol/L)  Date Value  07/17/2020 154 (H)  05/27/2019 144  12/28/2012 142    Assessment: Pharmacy consulted to replace electrolytes in this 64 year old female admitted with sepsis of unknown source.. Pt with PMH of Stage 3 CKD and baseline Scr of 1.0 in May 2021. Nephrology is following.  9/27 Electrolytes WNL, with sodium slightly elevated at 147, on NS at 75 mL/hr ordered to stop tonight at 2359 . Scr elevated but trending down (4.49>>3.75>>3.12) with good UOP yesterday.Pt placed on Calcium Gluconate 1g twice daily.  9/28: Sodium continues to trend up, at 150 today. Not on maintenance fluids. Calcium improving on calcium gluconate 1g twice daily. Kidney function improving with good UOP.   9/29 D5 at 40 ml/hr added for hypernatremia, Na 150>156  9/30 Na 156>>155, D5 increased to 75 mL/hr overnight. Other electrolytes WNL. Pt NPO. Scr trending down 2.46>>2.18>>1.92 and having good UOP.   Goal of Therapy:  Electrolytes WNL   Plan:  D5W at 100 ml/hr, Na 154 on morning BMP. Phos slightly low but unable to take PO, IV replacement limited by Na and K. Continue to follow along.  Tawnya Crook, PharmD 07/17/2020 2:02 PM

## 2020-07-17 NOTE — Progress Notes (Signed)
Initial Nutrition Assessment  DOCUMENTATION CODES:   Not applicable  INTERVENTION:  Recommend placement of small-bore feeding tube (Dobbhoff tube) for initiation of tube feeds: -Initiate Osmolite 1.5 Cal at 20 mL/hr and advance by 20 mL/hr every 8 hours to goal rate of 60 mL/hr (1440 mL goal daily volume) per tube -PROSource TF 45 mL BID per tube -Goal regimen provides 2240 kcal, 112 grams of protein, 1094 mL H2O daily  Monitor magnesium, potassium, and phosphorus daily for at least 3 days, MD to replete as needed, as pt is at risk for refeeding syndrome given prolonged NPO status.  NUTRITION DIAGNOSIS:   Inadequate oral intake related to inability to eat as evidenced by NPO status.  GOAL:   Patient will meet greater than or equal to 90% of their needs  MONITOR:   Diet advancement, Labs, Weight trends, I & O's  REASON FOR ASSESSMENT:   NPO/Clear Liquid Diet    ASSESSMENT:   64 year old female with PMHx of sarcoidosis, asthma, sleep apnea, IBS, hypothyroidism, GERD admitted with sepsis, acute on chronic hypoxic respiratory failure int he setting of community acquired PNA and acute exacerbation of pulmonary sarcoidosis requiring BiPAP, also with UTI, AKI, AMS.   9/24 admitted and placed on BiPAP 9/28 intubated 9/29 extubated 9/30 code stroke called - pt undergoing neurological work-up  Met with patient and her daughter at bedside. Patient is able to answer some questions herself with one work answers but most of history provided by daughter. Daughter reports patient has fairly good appetite and PO intake at home that has not previously been a concern. She reports patient typically eats 3 meals per day but occasionally will sleep late and may miss first meal. She may have food prepared by family or takeout. Daughter reports patient occasionally has coughed after oral intake if eating too quickly or not paying attention but is able to recover afterwards.   Patient has now been  NPO for 7 days. Plan is for SLP assessment today. If diet is able to be advanced patient reports she is amenable to drinking oral nutrition supplements to help meet calorie/protein needs. If diet is unable to be advanced recommend placement of small-bore feeding tube (Dobbhoff tube) for initiation of tube feeds.  Daughter reports she is unsure of exact usual body weight but reports patient has not had any weight loss family is aware of. Per chart patient's weight appear to fluctuate so difficult to trend. She was 185 lbs on 07/25/2019, 197 lbs on 07/31/2019, 209 lbs on 10/21/2019, 191 lbs on 01/31/2020 (though these all appear to be stated/estimated wts so unsure if they were truly measured). Then for the rest of 2021 it appears pt was right around 190 lbs. Weights during this admission have fluctuated between 86.2-88.7 kg. Patient yesterday was documented to be87.5 kg (192.9 lbs). Will use weight of 86.2 kg to estimate needs at this time and continue to monitor weight trends.  Medications reviewed and include: Colace 100 mg BID, Novolog 0-15 units Q4hrs, Solu-Medrol 40 mg daily IV, Protonix, Miralax, thiamine 100 mg daily IV, calcium gluconate 1 gram BID IV, ceftriaxone, D5W at 100 mL/hr.  Labs reviewed: CBG 144-265, Sodium 154, BUN 60, Creatinine 1.49, Phosphorus 2.4.  Discussed with RN and on rounds.  Patient was assessed by SLP this afternoon who recommended patient remain NPO. RD messaged MD regarding recommendation for feeding tube placement for initiation of tube feeds. Per MD he would like to wait one more day for repeat SLP evaluation  tomorrow. MD is aware RD's tube feed recommendations will be in note today.  NUTRITION - FOCUSED PHYSICAL EXAM:    Most Recent Value  Orbital Region No depletion  Upper Arm Region Unable to assess  [edema]  Thoracic and Lumbar Region No depletion  Buccal Region No depletion  Temple Region No depletion  Clavicle Bone Region No depletion  Clavicle and Acromion  Bone Region No depletion  Scapular Bone Region Unable to assess  Dorsal Hand Mild depletion  Patellar Region Mild depletion  Anterior Thigh Region Mild depletion  Posterior Calf Region Moderate depletion  Edema (RD Assessment) None  [non-pitting]  Hair Reviewed  Eyes Reviewed  Mouth Unable to assess  Skin Reviewed  Nails Reviewed     Diet Order:   Diet Order            Diet NPO time specified  Diet effective now                EDUCATION NEEDS:   No education needs have been identified at this time  Skin:  Skin Assessment: Reviewed RN Assessment  Last BM:  PTA  Height:   Ht Readings from Last 1 Encounters:  07/14/20 5' 7.01" (1.702 m)   Weight:   Wt Readings from Last 1 Encounters:  07/16/20 87.5 kg   BMI:  Body mass index is 30.21 kg/m.  Estimated Nutritional Needs:   Kcal:  2050-2250  Protein:  105-115 grams  Fluid:  >/= 2 L/day  Jacklynn Barnacle, MS, RD, LDN Pager number available on Amion

## 2020-07-17 NOTE — TOC Initial Note (Signed)
Transition of Care [facility]) - Initial/Assessment Note    Patient Details  Name: Stacie [name] MRN: [id number] Date of Birth: [birth year]  Transition of Care [facility]) CM/SW Contact:    Magnus Ivan, LCSW Phone Number: 07/17/2020, 12:15 PM  Clinical Narrative:                Readmission risk score increased so CSW met with patient and daughter at bedside for Readmission Screening. Patient disoriented. Daughter reported patient lives at home with her husband. PCP is Dr. Army Melia. [facility] [address]. Patient has oxygen at home, daughter was unsure of which agency provides this at this time. No HH/SNF history. Patient was driving herself to some appointments but family provides transportation as needed. Daughter reported she stays with patient when needed for support and patient's spouse is a Physician so she has a lot of support at home. Encouraged the family to reach out with any needs. CSW will continue to follow.      Barriers to Discharge: Continued Medical Work up   Patient Goals and CMS Choice Patient states their goals for this hospitalization and ongoing recovery are:: tbd CMS Medicare.gov Compare Post Acute Care list provided to:: Patient Represenative (must comment) Choice offered to / list presented to : Adult Children  Expected Discharge Plan and Services         Living arrangements for the past 2 months: Single Family Home                                      Prior Living Arrangements/Services Living arrangements for the past 2 months: Single Family Home Lives with:: Spouse Patient language and need for interpreter reviewed:: Yes Do you feel safe going back to the place where you live?: Yes      Need for Family Participation in Patient Care: Yes (Comment) Care giver support system in place?: Yes (comment) Current home services: DME Criminal Activity/Legal Involvement Pertinent to Current Situation/Hospitalization: No - Comment as  needed  Activities of Daily Living Home Assistive Devices/Equipment: None ADL Screening (condition at time of admission) Patient's cognitive ability adequate to safely complete daily activities?: Yes Is the patient deaf or have difficulty hearing?: No Does the patient have difficulty seeing, even when wearing glasses/contacts?: No Does the patient have difficulty concentrating, remembering, or making decisions?: No Patient able to express need for assistance with ADLs?: Yes Does the patient have difficulty dressing or bathing?: No Independently performs ADLs?: Yes (appropriate for developmental age) Does the patient have difficulty walking or climbing stairs?: Yes Weakness of Legs: Both Weakness of Arms/Hands: None  Permission Sought/Granted   Permission granted to share information with : Yes, Verbal Permission Granted              Emotional Assessment   Attitude/Demeanor/Rapport: Unable to Assess Affect (typically observed): Unable to Assess Orientation: : Fluctuating Orientation (Suspected and/or reported Sundowners) Alcohol / Substance Use: Not Applicable Psych Involvement: No (comment)  Admission diagnosis:  Elevated LFTs [R79.89] Sepsis ([place]) [A41.9] Sepsis, due to unspecified organism, unspecified whether acute organ dysfunction present [facility]) [A41.9] Patient Active Problem List   Diagnosis Date Noted  . Sepsis ([place]) 07/10/2020  . Pulmonary hypertension ([place]) 12/18/2018  . Depression, major, recurrent, in partial remission ([place]) 10/22/2018  . Compression fx, thoracic spine ([place]) 12/04/2017  . Morbid obesity due to excess calories ([place]) 05/08/2017  . Hyperlipidemia, mixed 04/21/2017  .  Cough variant asthma  vs UACS  02/20/2017  . Hypothyroidism, acquired 03/27/2015  . Essential hypertension 03/27/2015  . Acquired absence of both cervix and uterus 03/27/2015  . H/O renal calculi 03/27/2015  . Allergic rhinitis 01/07/2015  . Obstructive sleep apnea 09/29/2014  .  Other malaise and fatigue 06/03/2014  . Asthma, chronic 02/24/2014  . Cough 04/30/2013  . Pulmonary sarcoidosis ([place]) 03/12/2013  . CKD (chronic kidney disease) stage 3, GFR 30-59 ml/min ([facility]) 03/12/2013  . Calcium blood increased 05/23/2012   PCP:  Glean Hess, MD Pharm[facility]   [id number]: [phone]: [address]Phone: [phone]: [phone] [address]Phone: [phone]: [phone] [id number] [place]: [phone]: [phone]   Social Determinants of Health (SDOH) Interventions    Readmission Risk Interventions Readmission Risk Prevention Plan 07/17/2020  Transportation Screening Complete  PCP or Specialist Appt within 3-5 Days Complete  HRI or Home Care Consult Complete  Palliative Care Screening Complete  Medication Review (RN Care Manager) Complete  Some recent data might be hidden

## 2020-07-17 NOTE — Progress Notes (Signed)
[place], [place] 07/17/20  Subjective:   Hospital day # 7  Patient awake and alert this morning, able follow some commands.She is on  supplemental O2 via [place]. SR on heart monitor   blood tinged water melon color urine output.   Renal: 09/30 0701 - 10/01 0700 In: 2346.4 [I.V.:2046.3; IV Piggyback:300.1] Out: 7026 [Urine:4475]   Objective:  Vital signs in last 24 hours:  Temp:  [97.5 F (36.4 C)-99.1 F (37.3 C)] 98.8 F (37.1 C) (10/01 0800) Pulse Rate:  [52-103] 99 (10/01 0800) Resp:  [12-25] 23 (10/01 0800) BP: (120-158)/(72-113) 150/74 (10/01 0800) SpO2:  [93 %-100 %] 98 % (10/01 0851)  Weight change:  Filed Weights   07/14/20 0500 07/15/20 0432 07/16/20 0500  Weight: 86.2 kg 88.7 kg 87.5 kg    Intake/Output:    Intake/Output Summary (Last 24 hours) at 07/17/2020 0934 Last data filed at 07/17/2020 0831 Gross per 24 hour  Intake 2532.46 ml  Output 4725 ml  Net -2192.54 ml     Physical Exam: General:  Awake,alert  HEENT  Normocephalic,atraumatic, [place] O2  Pulm/lungs  Wheezes + bilaterally  CVS/Heart  S1S2 + regular  Abdomen:   Soft, nondistended  Extremities:  No peripheral edema  Neurologic:  Awake,alert, follows certain commands, rt sided weakness  Skin:  Cool, purple toes 1,2 on right, left dorsal chronic purple lesion    Basic Metabolic Panel:  Recent Labs  Lab 07/13/20 0316 07/13/20 0316 07/14/20 0404 07/14/20 0404 07/15/20 0433 07/15/20 0433 07/15/20 1343 07/15/20 1343 07/16/20 0706 07/16/20 0930 07/16/20 1329 07/16/20 2122 07/17/20 0314  NA 147*   < > 150*   < > 156*   < > 156*   < > 156* 155* 150* 154* 154*  K 4.4   < > 4.1   < > 4.5  --  4.5  --  4.4 4.3  --   --  4.4  CL 107   < > 110   < > 114*  --  115*  --  115* 114*  --   --  109  CO2 27   < > 24   < > 31  --  29  --  31 31  --   --  31  GLUCOSE 158*   < > 188*   < > 212*  --  202*  --  230* 237*  --   --  234*  BUN 88*   < > 83*   < > 76*  --  79*   --  76* 77*  --   --  60*  CREATININE 3.12*   < > 2.46*   < > 2.35*  --  2.18*  --  1.92* 1.85*  --   --  1.49*  CALCIUM 6.6*   < > 7.4*   < > 7.6*   < > 7.7*   < > 7.9* 8.1*  --   --  7.9*  MG 2.6*  --  2.5*  --  2.5*  --   --   --  2.2  --   --   --  1.9  PHOS 4.3  --  5.3*  --  5.8*  --   --   --  3.2  --   --   --  2.4*   < > = values in this interval not displayed.     CBC: Recent Labs  Lab 07/13/20 0316 07/13/20 1301 07/14/20 0404 07/15/20 0433 07/16/20 0706 07/17/20  0314  WBC 17.2*  --  18.6* 13.7* 19.6* 26.0*  NEUTROABS 15.1*  --  16.4* 11.5* 16.0* 22.2*  HGB 12.1 11.7 12.7 13.1 13.1 13.8  HCT 37.7  --  40.6 42.6 42.8 45.7  MCV 95.7  --  96.9 99.1 100.2* 100.9*  PLT 24*  --  39* 64* 77* 76*      Lab Results  Component Value Date   HEPBSAG NON REACTIVE 07/15/2020   HEPBIGM NON REACTIVE 07/15/2020      Microbiology:  Recent Results (from the past 240 hour(s))  Urine culture     Status: Abnormal   Collection Time: 07/09/20 11:27 PM   Specimen: In/Out Cath Urine  Result Value Ref Range Status   Specimen Description   Final    IN/OUT CATH URINE Performed at [facility], [address] [place], [place] [address]    Special Requests   Final    NONE Performed at [facility], [address] [place], [place] [address]    Culture >=100,000 COLONIES/mL KLEBSIELLA PNEUMONIAE (A)  Final   Report Status 07/12/2020 FINAL  Final   Organism ID, Bacteria KLEBSIELLA PNEUMONIAE (A)  Final      Susceptibility   Klebsiella pneumoniae - MIC*    AMPICILLIN RESISTANT Resistant     CEFAZOLIN <=4 SENSITIVE Sensitive     CEFTRIAXONE <=0.25 SENSITIVE Sensitive     CIPROFLOXACIN <=0.25 SENSITIVE Sensitive     GENTAMICIN <=1 SENSITIVE Sensitive     IMIPENEM <=0.25 SENSITIVE Sensitive     NITROFURANTOIN <=16 SENSITIVE Sensitive     TRIMETH/SULFA <=20 SENSITIVE Sensitive     AMPICILLIN/SULBACTAM <=2 SENSITIVE Sensitive     PIP/TAZO <=4 SENSITIVE Sensitive      * >=100,000 COLONIES/mL KLEBSIELLA PNEUMONIAE  Respiratory Panel by RT PCR (Flu A&B, Covid) - Nasopharyngeal Swab     Status: None   Collection Time: 07/09/20 11:28 PM   Specimen: Nasopharyngeal Swab  Result Value Ref Range Status   SARS Coronavirus 2 by RT PCR NEGATIVE NEGATIVE Final    Comment: (NOTE) SARS-CoV-2 target nucleic acids are NOT DETECTED.  The SARS-CoV-2 RNA is generally detectable in upper respiratoy specimens during the acute phase of infection. The lowest concentration of SARS-CoV-2 viral copies this assay can detect is 131 copies/mL. A negative result does not preclude SARS-Cov-2 infection and should not be used as the sole basis for treatment or other patient management decisions. A negative result may occur with  improper specimen collection/handling, submission of specimen other than nasopharyngeal swab, presence of viral mutation(s) within the areas targeted by this assay, and inadequate number of viral copies (<131 copies/mL). A negative result must be combined with clinical observations, patient history, and epidemiological information. The expected result is Negative.  Fact Sheet for Patients:  PinkCheek.be  Fact Sheet for Healthcare Providers:  GravelBags.it  This test is no t yet approved or cleared by the [place] FDA and  has been authorized for detection and/or diagnosis of SARS-CoV-2 by FDA under an Emergency Use Authorization (EUA). This EUA will remain  in effect (meaning this test can be used) for the duration of the COVID-19 declaration under Section 564(b)(1) of the Act, 21 U.S.C. section 360bbb-3(b)(1), unless the authorization is terminated or revoked sooner.     Influenza A by PCR NEGATIVE NEGATIVE Final   Influenza B by PCR NEGATIVE NEGATIVE Final    Comment: (NOTE) The Xpert Xpress SARS-CoV-2/FLU/RSV assay is intended as an aid in  the diagnosis of influenza from  Nasopharyngeal swab specimens and  should not be used as a sole basis for treatment. Nasal washings and  aspirates are unacceptable for Xpert Xpress SARS-CoV-2/FLU/RSV  testing.  Fact Sheet for Patients: PinkCheek.be  Fact Sheet for Healthcare Providers: GravelBags.it  This test is not yet approved or cleared by the [place] FDA and  has been authorized for detection and/or diagnosis of SARS-CoV-2 by  FDA under an Emergency Use Authorization (EUA). This EUA will remain  in effect (meaning this test can be used) for the duration of the  Covid-19 declaration under Section 564(b)(1) of the Act, 21  U.S.C. section 360bbb-3(b)(1), unless the authorization is  terminated or revoked. Performed at [facility], [address] [place], [place] [address]   Blood Culture (routine x 2)     Status: Abnormal   Collection Time: 07/09/20 11:36 PM   Specimen: BLOOD  Result Value Ref Range Status   Specimen Description   Final    BLOOD RIGHT ANTECUBITAL Performed at [facility], [address] [place], [place] [address]    Special Requests   Final    BOTTLES DRAWN AEROBIC AND ANAEROBIC Blood Culture adequate volume Performed at [facility], [address] [place], [place] [address]    Culture  Setup Time   Final    Organism ID to follow GRAM NEGATIVE RODS IN BOTH AEROBIC AND ANAEROBIC BOTTLES CRITICAL RESULT CALLED TO, READ BACK BY AND VERIFIED WITH: AMY THOMPSON AT 1220 07/10/20 SDR Performed at [facility], [address] [place], [place] [address]    Culture KLEBSIELLA PNEUMONIAE (A)  Final   Report Status 07/12/2020 FINAL  Final   Organism ID, Bacteria KLEBSIELLA PNEUMONIAE  Final      Susceptibility   Klebsiella pneumoniae - MIC*    AMPICILLIN RESISTANT Resistant     CEFAZOLIN <=4 SENSITIVE Sensitive     CEFEPIME <=0.12 SENSITIVE Sensitive     CEFTAZIDIME <=1 SENSITIVE  Sensitive     CEFTRIAXONE <=0.25 SENSITIVE Sensitive     CIPROFLOXACIN <=0.25 SENSITIVE Sensitive     GENTAMICIN <=1 SENSITIVE Sensitive     IMIPENEM <=0.25 SENSITIVE Sensitive     TRIMETH/SULFA <=20 SENSITIVE Sensitive     AMPICILLIN/SULBACTAM <=2 SENSITIVE Sensitive     PIP/TAZO <=4 SENSITIVE Sensitive     * KLEBSIELLA PNEUMONIAE  Blood Culture ID Panel (Reflexed)     Status: Abnormal   Collection Time: 07/09/20 11:36 PM  Result Value Ref Range Status   Enterococcus faecalis NOT DETECTED NOT DETECTED Final   Enterococcus Faecium NOT DETECTED NOT DETECTED Final   Listeria monocytogenes NOT DETECTED NOT DETECTED Final   Staphylococcus species NOT DETECTED NOT DETECTED Final   Staphylococcus aureus (BCID) NOT DETECTED NOT DETECTED Final   Staphylococcus epidermidis NOT DETECTED NOT DETECTED Final   Staphylococcus lugdunensis NOT DETECTED NOT DETECTED Final   Streptococcus species NOT DETECTED NOT DETECTED Final   Streptococcus agalactiae NOT DETECTED NOT DETECTED Final   Streptococcus pneumoniae NOT DETECTED NOT DETECTED Final   Streptococcus pyogenes NOT DETECTED NOT DETECTED Final   A.calcoaceticus-baumannii NOT DETECTED NOT DETECTED Final   Bacteroides fragilis NOT DETECTED NOT DETECTED Final   Enterobacterales DETECTED (A) NOT DETECTED Final    Comment: Enterobacterales represent a large order of gram negative bacteria, not a single organism. CRITICAL RESULT CALLED TO, READ BACK BY AND VERIFIED WITH:  AMY THOMPSON AT 1220 04/09/20 SDR    Enterobacter cloacae complex NOT DETECTED NOT DETECTED Final   Escherichia coli NOT  DETECTED NOT DETECTED Final   Klebsiella aerogenes NOT DETECTED NOT DETECTED Final   Klebsiella oxytoca NOT DETECTED NOT DETECTED Final   Klebsiella pneumoniae DETECTED (A) NOT DETECTED Final    Comment: CRITICAL RESULT CALLED TO, READ BACK BY AND VERIFIED WITH:  AMY THOMPSON AT 1220 07/10/20 SDR    Proteus species NOT DETECTED NOT DETECTED Final   Salmonella  species NOT DETECTED NOT DETECTED Final   Serratia marcescens NOT DETECTED NOT DETECTED Final   Haemophilus influenzae NOT DETECTED NOT DETECTED Final   Neisseria meningitidis NOT DETECTED NOT DETECTED Final   Pseudomonas aeruginosa NOT DETECTED NOT DETECTED Final   Stenotrophomonas maltophilia NOT DETECTED NOT DETECTED Final   Candida albicans NOT DETECTED NOT DETECTED Final   Candida auris NOT DETECTED NOT DETECTED Final   Candida glabrata NOT DETECTED NOT DETECTED Final   Candida krusei NOT DETECTED NOT DETECTED Final   Candida parapsilosis NOT DETECTED NOT DETECTED Final   Candida tropicalis NOT DETECTED NOT DETECTED Final   Cryptococcus neoformans/gattii NOT DETECTED NOT DETECTED Final   CTX-M ESBL NOT DETECTED NOT DETECTED Final   Carbapenem resistance IMP NOT DETECTED NOT DETECTED Final   Carbapenem resistance KPC NOT DETECTED NOT DETECTED Final   Carbapenem resistance NDM NOT DETECTED NOT DETECTED Final   Carbapenem resist OXA 48 LIKE NOT DETECTED NOT DETECTED Final   Carbapenem resistance VIM NOT DETECTED NOT DETECTED Final    Comment: Performed at [facility], [address] [place], [place] [address]  Blood Culture (routine x 2)     Status: Abnormal   Collection Time: 07/09/20 11:37 PM   Specimen: BLOOD  Result Value Ref Range Status   Specimen Description   Final    BLOOD BLOOD RIGHT FOREARM Performed at [facility], [address], [place], [place] [address]    Special Requests   Final    BOTTLES DRAWN AEROBIC AND ANAEROBIC Blood Culture adequate volume Performed at [facility], [address] [place], [place] [address]    Culture  Setup Time   Final    GRAM NEGATIVE RODS IN BOTH AEROBIC AND ANAEROBIC BOTTLES CRITICAL RESULT CALLED TO, READ BACK BY AND VERIFIED WITH: AMY THOMPSON AT 1220 07/10/20 SDR Performed at [facility], [address] [place], [place] [address]    Culture (A)  Final    KLEBSIELLA  PNEUMONIAE SUSCEPTIBILITIES PERFORMED ON PREVIOUS CULTURE WITHIN THE LAST 5 DAYS. Performed at [facility], [place] [address], [place], [place] [address]    Report Status 07/12/2020 FINAL  Final  Aspergillus Ag, BAL/Serum     Status: None   Collection Time: 07/10/20  5:04 AM   Specimen: Blood  Result Value Ref Range Status   Aspergillus Ag, BAL/Serum 0.03 0.00 - 0.49 Index Final    Comment: (NOTE) Performed At: [facility] [place], [place] [id number] Rush Farmer MD FG:[id number]   MRSA PCR Screening     Status: None   Collection Time: 07/10/20 12:54 PM   Specimen: Nasal Mucosa; Nasopharyngeal  Result Value Ref Range Status   MRSA by PCR NEGATIVE NEGATIVE Final    Comment:        The GeneXpert MRSA Assay (FDA approved for NASAL specimens only), is one component of a comprehensive MRSA colonization surveillance program. It is not intended to diagnose MRSA infection nor to guide or monitor treatment for MRSA infections. Performed at [facility], [address] [place], [place] [address]   Culture, blood (  Routine X 2) w Reflex to ID Panel     Status: Abnormal (Preliminary result)   Collection Time: 07/16/20  1:29 PM   Specimen: BLOOD  Result Value Ref Range Status   Specimen Description BLOOD LEFT ANTECUBITAL  Final   Special Requests (A)  Final    AEROCOCCUS SPECIES Blood Culture results may not be optimal due to an inadequate volume of blood received in culture bottles   Culture   Final    NO GROWTH < 24 HOURS Performed at [facility], [address], [place], [place] [address]    Report Status PENDING  Incomplete  Culture, blood (Routine X 2) w Reflex to ID Panel     Status: None (Preliminary result)   Collection Time: 07/16/20  5:09 PM   Specimen: BLOOD  Result Value Ref Range Status   Specimen Description BLOOD BLOOD RIGHT HAND  Final   Special Requests   Final    BOTTLES DRAWN AEROBIC ONLY Blood Culture results  may not be optimal due to an inadequate volume of blood received in culture bottles   Culture   Final    NO GROWTH < 24 HOURS Performed at [facility], [address] [place],  [address]    Report Status PENDING  Incomplete    Coagulation Studies: Recent Labs    07/14/20 1334  LABPROT 15.8*  INR 1.3*    Urinalysis: No results for input(s): COLORURINE, LABSPEC, PHURINE, GLUCOSEU, HGBUR, BILIRUBINUR, KETONESUR, PROTEINUR, UROBILINOGEN, NITRITE, LEUKOCYTESUR in the last 72 hours.  Invalid input(s): APPERANCEUR    Imaging: EEG  Result Date: 07/16/2020 [name], MD     07/16/2020  5:23 PM ELECTROENCEPHALOGRAM REPORT Patient: Stacie [name]       Room #: IC11A EEG No. ID: [id number] Age: 64 y.o.        Sex: female Requesting Physician: Lanney Gins Report Date:  07/16/2020       Interpreting Physician: [name] History: Hiliana KIAMESHA SAMET is an 64 y.o. female with altered mental status and right sided weakness Medications: Brovana, ASA, Rocephin, Colace, Insulin, Solumedrol, Tyvaso Conditions of Recording:  This is a 21 channel routine scalp EEG performed with bipolar and monopolar montages arranged in accordance to the international 10/20 system of electrode placement. One channel was dedicated to EKG recording. The patient is in the awake and uncooperative state. Description:  Artifact is prominent during the recording often obscuring the background rhythm. When able to be visualized the background is slow and poorly organized.   It consists of low voltage activity in the delta-theta continuum.  For the most [name] this activity is continuous.  There are some rare short periods of generalized attenuation noted.  There are also some rare periodic discharges of triphasic morphology noted.  No epileptiform activity is noted.  Hyperventilation and ntermittent photic stimulation were not performed. IMPRESSION: This is an abnormal EEG secondary to general background slowing  with rare triphasic waves noted.  This finding may be seen with a diffuse cerebral disturbance that is etiologically nonspecific, but may include a metabolic encephalopathy, among other possibilities.  No epileptiform activity was noted.  [name], MD Neurology [phone] 07/16/2020, 5:18 PM   CT ANGIO HEAD W OR WO CONTRAST  Result Date: 07/16/2020 CLINICAL DATA:  Stroke suspected.  Unable to move right side. EXAM: CT ANGIOGRAPHY HEAD AND NECK CT PERFUSION BRAIN TECHNIQUE: Multidetector CT imaging of the head and neck was performed using the standard protocol during bolus administration of intravenous contrast. Multiplanar  CT image reconstructions and MIPs were obtained to evaluate the vascular anatomy. Carotid stenosis measurements (when applicable) are obtained utilizing NASCET criteria, using the distal internal carotid diameter as the denominator. Multiphase CT imaging of the brain was performed following IV bolus contrast injection. Subsequent parametric perfusion maps were calculated using RAPID software. CONTRAST:  128m OMNIPAQUE IOHEXOL 350 MG/ML SOLN COMPARISON:  CT head 07/10/2020 FINDINGS: CT HEAD FINDINGS Brain: Evaluation is limited by patient motion. There is no definite evidence of acute large vascular territory infarct. There is similar extensive patchy white matter hypoattenuation, most likely the sequela of chronic microvascular ischemic disease. No acute hemorrhage. No abnormal mass effect or mass lesion. No hydrocephalus. Vascular: No hyperdense vessel identified. Calcific atherosclerosis. Skull: No evidence of acute fracture. Sinuses/Orbits: Limit evaluation without evidence of fracture or focal lesion. Other: None. ASPECTS ([facility] Score) Total score (0-10 with 10 being normal): 10 Review of the MIP images confirms the above findings CTA NECK FINDINGS Nondiagnostic arterial evaluation in the upper neck/skull base, including the internal carotid arteries in  the neck and mid to distal vertebral arteries. Aortic arch: Calcified atherosclerosis without evidence of significant stenosis or aneurysm. Right carotid system: The visualized common carotid artery is within normal limits without significant stenosis or occlusion. Nondiagnostic evaluation of the distal common carotid artery and internal carotid artery to the level of the petrous carotid. Left carotid system: Calcified and noncalcified atherosclerosis of the common carotid artery without evidence of significant stenosis. Nondiagnostic evaluation of the distal common carotid artery and internal carotid artery to the level of the petrous carotid. Vertebral arteries: Approximately, no significant stenosis or occlusion. Nondiagnostic evaluation of the mid to distal vertebral arteries. Skeleton: Multilevel severe degenerative change. Other neck: No evidence of mass or adenopathy. Upper chest: Partially imaged fibrotic lung changes, possibly related to the patient's history of sarcoidosis. Review of the MIP images confirms the above findings CTA HEAD FINDINGS Anterior circulation: Calcific atherosclerosis of bilateral cavernous carotids without evidence of greater than 50% narrowing. Bilateral M1 MCAs and proximal M2 MCA is are patent without evidence of significant stenosis. Distal MCA branches appear relatively symmetric. The right A1 ACA is hypoplastic with a large left A1 ACA, likely anatomic variation. Posterior circulation: No significant stenosis or occlusion of the distal V4 vertebral arteries and basilar artery. Fetal type right PCA. No significant stenosis identified of the posterior cerebral arteries. No aneurysm. Venous sinuses: As permitted by contrast timing, [email or web address] Review of the MIP images confirms the above findings CT Brain Perfusion Findings: ASPECTS: 10 CBF (<30%) Volume: 017mPerfusion (Tmax>6.0s) volume: 31m64mismatch Volume: 31mL67mlculated T-max greater than 6 sec and mismatch is favored artifactual  given location in a region of streak/motion and non vascular territory. IMPRESSION: 1. Overall limited evaluation given patient motion with nondiagnostic arterial evaluation in the upper neck/skull base, including the internal carotid arteries and mid to distal vertebral arteries in the neck. Within this limitation, no evidence of acute intracranial abnormality, large vessel occlusion or significant (greater than 50%) arterial stenosis. Repeat CTA neck could further evaluate if clinically indicated. 2. No convincing penumbra. Small area of reported mismatch is favored artifactual. 3. Partially imaged fibrotic lung changes, possibly related to the patient's reported history of sarcoidosis. Code stroke imaging results were communicated on 07/16/2020 at 11:58 am to provider Dr. ReynDoy Mince telephone, who verbally acknowledged these results. Electronically Signed   By: FredMargaretha Sheffield  On: 07/16/2020 12:17   CT ANGIO NECK W OR  WO CONTRAST  Result Date: 07/16/2020 CLINICAL DATA:  Stroke suspected.  Unable to move right side. EXAM: CT ANGIOGRAPHY HEAD AND NECK CT PERFUSION BRAIN TECHNIQUE: Multidetector CT imaging of the head and neck was performed using the standard protocol during bolus administration of intravenous contrast. Multiplanar CT image reconstructions and MIPs were obtained to evaluate the vascular anatomy. Carotid stenosis measurements (when applicable) are obtained utilizing NASCET criteria, using the distal internal carotid diameter as the denominator. Multiphase CT imaging of the brain was performed following IV bolus contrast injection. Subsequent parametric perfusion maps were calculated using RAPID software. CONTRAST:  173m OMNIPAQUE IOHEXOL 350 MG/ML SOLN COMPARISON:  CT head 07/10/2020 FINDINGS: CT HEAD FINDINGS Brain: Evaluation is limited by patient motion. There is no definite evidence of acute large vascular territory infarct. There is similar extensive patchy white matter  hypoattenuation, most likely the sequela of chronic microvascular ischemic disease. No acute hemorrhage. No abnormal mass effect or mass lesion. No hydrocephalus. Vascular: No hyperdense vessel identified. Calcific atherosclerosis. Skull: No evidence of acute fracture. Sinuses/Orbits: Limit evaluation without evidence of fracture or focal lesion. Other: None. ASPECTS ([facility]) Total score (0-10 with 10 being normal): 10 Review of the MIP images confirms the above findings CTA NECK FINDINGS Nondiagnostic arterial evaluation in the upper neck/skull base, including the internal carotid arteries in the neck and mid to distal vertebral arteries. Aortic arch: Calcified atherosclerosis without evidence of significant stenosis or aneurysm. Right carotid system: The visualized common carotid artery is within normal limits without significant stenosis or occlusion. Nondiagnostic evaluation of the distal common carotid artery and internal carotid artery to the level of the petrous carotid. Left carotid system: Calcified and noncalcified atherosclerosis of the common carotid artery without evidence of significant stenosis. Nondiagnostic evaluation of the distal common carotid artery and internal carotid artery to the level of the petrous carotid. Vertebral arteries: Approximately, no significant stenosis or occlusion. Nondiagnostic evaluation of the mid to distal vertebral arteries. Skeleton: Multilevel severe degenerative change. Other neck: No evidence of mass or adenopathy. Upper chest: Partially imaged fibrotic lung changes, possibly related to the patient's history of sarcoidosis. Review of the MIP images confirms the above findings CTA HEAD FINDINGS Anterior circulation: Calcific atherosclerosis of bilateral cavernous carotids without evidence of greater than 50% narrowing. Bilateral M1 MCAs and proximal M2 MCA is are patent without evidence of significant stenosis. Distal MCA branches appear  relatively symmetric. The right A1 ACA is hypoplastic with a large left A1 ACA, likely anatomic variation. Posterior circulation: No significant stenosis or occlusion of the distal V4 vertebral arteries and basilar artery. Fetal type right PCA. No significant stenosis identified of the posterior cerebral arteries. No aneurysm. Venous sinuses: As permitted by contrast timing, [email or web address] Review of the MIP images confirms the above findings CT Brain Perfusion Findings: ASPECTS: 10 CBF (<30%) Volume: 033mPerfusion (Tmax>6.0s) volume: 46m81mismatch Volume: 46mL55mlculated T-max greater than 6 sec and mismatch is favored artifactual given location in a region of streak/motion and non vascular territory. IMPRESSION: 1. Overall limited evaluation given patient motion with nondiagnostic arterial evaluation in the upper neck/skull base, including the internal carotid arteries and mid to distal vertebral arteries in the neck. Within this limitation, no evidence of acute intracranial abnormality, large vessel occlusion or significant (greater than 50%) arterial stenosis. Repeat CTA neck could further evaluate if clinically indicated. 2. No convincing penumbra. Small area of reported mismatch is favored artifactual. 3. Partially imaged fibrotic lung changes, possibly related to the  patient's reported history of sarcoidosis. Code stroke imaging results were communicated on 07/16/2020 at 11:58 am to provider Dr. Doy Mince via telephone, who verbally acknowledged these results. Electronically Signed   By: Margaretha Sheffield MD   On: 07/16/2020 12:17   MR BRAIN W WO CONTRAST  Result Date: 07/16/2020 CLINICAL DATA:  Neuro deficit, acute, stroke suspected. EXAM: MRI HEAD WITHOUT AND WITH CONTRAST TECHNIQUE: Multiplanar, multiecho pulse sequences of the brain and surrounding structures were obtained without and with intravenous contrast. CONTRAST:  4m GADAVIST GADOBUTROL 1 MMOL/ML IV SOLN COMPARISON:  Same day CT imaging FINDINGS:  Brain: Severely limited evaluation with multiple nondiagnostic sequences. Diffusion-weighted imaging is significantly limited with multiple small areas of DWI hyperintensity in the white matter. Patchy bilateral white matter T2/FLAIR hyperintensities, likely the sequela of chronic microvascular ischemic disease. Mild diffuse cerebral volume loss with ex vacuo ventricular dilation. No hydrocephalus. No large hemorrhage, mass lesion, or significant mass effect. Vascular: Better evaluated on same-day CT code stroke. Flow voids are grossly maintained at the skull base. Skull and upper cervical spine: Not well evaluated Sinuses/Orbits: No substantial paranasal sinus disease. No acute orbital abnormality. Other: Small left mastoid effusion. IMPRESSION: Severely limited evaluation with multiple nondiagnostic sequences. Diffusion-weighted imaging is significantly limited with multiple small areas of DWI hyperintensity in the bilateral white matter representing artifact versus small infarcts. Repeat MRI (possibly with sedation) could further evaluate if clinically indicated. Electronically Signed   By: FMargaretha SheffieldMD   On: 07/16/2020 12:46   CT CEREBRAL PERFUSION W CONTRAST  Result Date: 07/16/2020 CLINICAL DATA:  Stroke suspected.  Unable to move right side. EXAM: CT ANGIOGRAPHY HEAD AND NECK CT PERFUSION BRAIN TECHNIQUE: Multidetector CT imaging of the head and neck was performed using the standard protocol during bolus administration of intravenous contrast. Multiplanar CT image reconstructions and MIPs were obtained to evaluate the vascular anatomy. Carotid stenosis measurements (when applicable) are obtained utilizing NASCET criteria, using the distal internal carotid diameter as the denominator. Multiphase CT imaging of the brain was performed following IV bolus contrast injection. Subsequent parametric perfusion maps were calculated using RAPID software. CONTRAST:  1069mOMNIPAQUE IOHEXOL 350 MG/ML SOLN  COMPARISON:  CT head 07/10/2020 FINDINGS: CT HEAD FINDINGS Brain: Evaluation is limited by patient motion. There is no definite evidence of acute large vascular territory infarct. There is similar extensive patchy white matter hypoattenuation, most likely the sequela of chronic microvascular ischemic disease. No acute hemorrhage. No abnormal mass effect or mass lesion. No hydrocephalus. Vascular: No hyperdense vessel identified. Calcific atherosclerosis. Skull: No evidence of acute fracture. Sinuses/Orbits: Limit evaluation without evidence of fracture or focal lesion. Other: None. ASPECTS ([facility] Early CT Score) Total score (0-10 with 10 being normal): 10 Review of the MIP images confirms the above findings CTA NECK FINDINGS Nondiagnostic arterial evaluation in the upper neck/skull base, including the internal carotid arteries in the neck and mid to distal vertebral arteries. Aortic arch: Calcified atherosclerosis without evidence of significant stenosis or aneurysm. Right carotid system: The visualized common carotid artery is within normal limits without significant stenosis or occlusion. Nondiagnostic evaluation of the distal common carotid artery and internal carotid artery to the level of the petrous carotid. Left carotid system: Calcified and noncalcified atherosclerosis of the common carotid artery without evidence of significant stenosis. Nondiagnostic evaluation of the distal common carotid artery and internal carotid artery to the level of the petrous carotid. Vertebral arteries: Approximately, no significant stenosis or occlusion. Nondiagnostic evaluation of the mid to distal vertebral  arteries. Skeleton: Multilevel severe degenerative change. Other neck: No evidence of mass or adenopathy. Upper chest: Partially imaged fibrotic lung changes, possibly related to the patient's history of sarcoidosis. Review of the MIP images confirms the above findings CTA HEAD FINDINGS Anterior  circulation: Calcific atherosclerosis of bilateral cavernous carotids without evidence of greater than 50% narrowing. Bilateral M1 MCAs and proximal M2 MCA is are patent without evidence of significant stenosis. Distal MCA branches appear relatively symmetric. The right A1 ACA is hypoplastic with a large left A1 ACA, likely anatomic variation. Posterior circulation: No significant stenosis or occlusion of the distal V4 vertebral arteries and basilar artery. Fetal type right PCA. No significant stenosis identified of the posterior cerebral arteries. No aneurysm. Venous sinuses: As permitted by contrast timing, [email or web address] Review of the MIP images confirms the above findings CT Brain Perfusion Findings: ASPECTS: 10 CBF (<30%) Volume: 44m Perfusion (Tmax>6.0s) volume: 632mMismatch Volume: 38m52malculated T-max greater than 6 sec and mismatch is favored artifactual given location in a region of streak/motion and non vascular territory. IMPRESSION: 1. Overall limited evaluation given patient motion with nondiagnostic arterial evaluation in the upper neck/skull base, including the internal carotid arteries and mid to distal vertebral arteries in the neck. Within this limitation, no evidence of acute intracranial abnormality, large vessel occlusion or significant (greater than 50%) arterial stenosis. Repeat CTA neck could further evaluate if clinically indicated. 2. No convincing penumbra. Small area of reported mismatch is favored artifactual. 3. Partially imaged fibrotic lung changes, possibly related to the patient's reported history of sarcoidosis. Code stroke imaging results were communicated on 07/16/2020 at 11:58 am to provider Dr. ReyDoy Mincea telephone, who verbally acknowledged these results. Electronically Signed   By: FreMargaretha Sheffield   On: 07/16/2020 12:17   CT HEAD CODE STROKE WO CONTRAST  Result Date: 07/16/2020 CLINICAL DATA:  Stroke suspected.  Unable to move right side. EXAM: CT ANGIOGRAPHY HEAD AND  NECK CT PERFUSION BRAIN TECHNIQUE: Multidetector CT imaging of the head and neck was performed using the standard protocol during bolus administration of intravenous contrast. Multiplanar CT image reconstructions and MIPs were obtained to evaluate the vascular anatomy. Carotid stenosis measurements (when applicable) are obtained utilizing NASCET criteria, using the distal internal carotid diameter as the denominator. Multiphase CT imaging of the brain was performed following IV bolus contrast injection. Subsequent parametric perfusion maps were calculated using RAPID software. CONTRAST:  100m19mNIPAQUE IOHEXOL 350 MG/ML SOLN COMPARISON:  CT head 07/10/2020 FINDINGS: CT HEAD FINDINGS Brain: Evaluation is limited by patient motion. There is no definite evidence of acute large vascular territory infarct. There is similar extensive patchy white matter hypoattenuation, most likely the sequela of chronic microvascular ischemic disease. No acute hemorrhage. No abnormal mass effect or mass lesion. No hydrocephalus. Vascular: No hyperdense vessel identified. Calcific atherosclerosis. Skull: No evidence of acute fracture. Sinuses/Orbits: Limit evaluation without evidence of fracture or focal lesion. Other: None. ASPECTS ([facility] Early CT Score) Total score (0-10 with 10 being normal): 10 Review of the MIP images confirms the above findings CTA NECK FINDINGS Nondiagnostic arterial evaluation in the upper neck/skull base, including the internal carotid arteries in the neck and mid to distal vertebral arteries. Aortic arch: Calcified atherosclerosis without evidence of significant stenosis or aneurysm. Right carotid system: The visualized common carotid artery is within normal limits without significant stenosis or occlusion. Nondiagnostic evaluation of the distal common carotid artery and internal carotid artery to the level of the petrous carotid. Left carotid system: Calcified and  noncalcified atherosclerosis  of the common carotid artery without evidence of significant stenosis. Nondiagnostic evaluation of the distal common carotid artery and internal carotid artery to the level of the petrous carotid. Vertebral arteries: Approximately, no significant stenosis or occlusion. Nondiagnostic evaluation of the mid to distal vertebral arteries. Skeleton: Multilevel severe degenerative change. Other neck: No evidence of mass or adenopathy. Upper chest: Partially imaged fibrotic lung changes, possibly related to the patient's history of sarcoidosis. Review of the MIP images confirms the above findings CTA HEAD FINDINGS Anterior circulation: Calcific atherosclerosis of bilateral cavernous carotids without evidence of greater than 50% narrowing. Bilateral M1 MCAs and proximal M2 MCA is are patent without evidence of significant stenosis. Distal MCA branches appear relatively symmetric. The right A1 ACA is hypoplastic with a large left A1 ACA, likely anatomic variation. Posterior circulation: No significant stenosis or occlusion of the distal V4 vertebral arteries and basilar artery. Fetal type right PCA. No significant stenosis identified of the posterior cerebral arteries. No aneurysm. Venous sinuses: As permitted by contrast timing, [email or web address] Review of the MIP images confirms the above findings CT Brain Perfusion Findings: ASPECTS: 10 CBF (<30%) Volume: 73m Perfusion (Tmax>6.0s) volume: 6260mMismatch Volume: 60m68malculated T-max greater than 6 sec and mismatch is favored artifactual given location in a region of streak/motion and non vascular territory. IMPRESSION: 1. Overall limited evaluation given patient motion with nondiagnostic arterial evaluation in the upper neck/skull base, including the internal carotid arteries and mid to distal vertebral arteries in the neck. Within this limitation, no evidence of acute intracranial abnormality, large vessel occlusion or significant (greater than 50%) arterial stenosis. Repeat CTA  neck could further evaluate if clinically indicated. 2. No convincing penumbra. Small area of reported mismatch is favored artifactual. 3. Partially imaged fibrotic lung changes, possibly related to the patient's reported history of sarcoidosis. Code stroke imaging results were communicated on 07/16/2020 at 11:58 am to provider Dr. ReyDoy Mincea telephone, who verbally acknowledged these results. Electronically Signed   By: FreMargaretha Sheffield   On: 07/16/2020 12:17     Medications:   . calcium gluconate 1,000 mg (07/17/20 0831)  . cefTRIAXone (ROCEPHIN)  IV Stopped (07/16/20 1921)  . dexmedetomidine (PRECEDEX) IV infusion Stopped (07/15/20 0655)  . dextrose 100 mL/hr at 07/17/20 0831  . fentaNYL infusion INTRAVENOUS Stopped (07/15/20 1023)   . arformoterol  15 mcg Nebulization BID  . aspirin EC  81 mg Oral Daily  . budesonide (PULMICORT) nebulizer solution  0.25 mg Nebulization BID  . chlorhexidine  15 mL Mouth Rinse BID  . chlorhexidine gluconate (MEDLINE KIT)  15 mL Mouth Rinse BID  . Chlorhexidine Gluconate Cloth  6 each Topical Q0600  . docusate  100 mg Oral BID  . insulin aspart  0-15 Units Subcutaneous Q4H  . mouth rinse  15 mL Mouth Rinse 10 times per day  . methylPREDNISolone (SOLU-MEDROL) injection  40 mg Intravenous Daily  . pantoprazole (PROTONIX) IV  40 mg Intravenous QHS  . polyethylene glycol  17 g Oral Daily  . thiamine injection  100 mg Intravenous Daily  . Treprostinil  18 mcg Inhalation QID   acetaminophen, docusate sodium, fentaNYL, polyethylene glycol  Assessment/ Plan:  [age]o. female with  sarcoidosis, interstitial lung disease, obstructive sleep apnea, depression, GERD, hyperlipidemia, hypothyroidism   admitted on 07/09/2020 for Elevated LFTs [R79.89] Sepsis ([facility]] Sepsis, due to unspecified organism, unspecified whether acute organ dysfunction present ([facility]]  # AKI Baseline Cr 1.0 in May 2021 Admi Cr 4.16, peaked  at 4.57 Good UOP Creatinine  trend is improving as noted below Lab Results  Component Value Date   CREATININE 1.49 (H) 07/17/2020   CREATININE 1.85 (H) 07/16/2020   CREATININE 1.92 (H) 07/16/2020     #Sepsis from urinary source Klebsiella pneumonia and blood in urine Patient has significant history of renal stones.  History of left ureteroscopy May 2021  CT Abd/Pelvis on  07/14/20 IMPRESSION: 7-8 mm proximal right ureteral stone with obstructive change and perinephric stranding. Urology placed ureteral stents bilaterally on 07/14/20 F/C draining blood tinged urine   #History of pulmonary sarcoidosis, pulmonary hypertension Acute on chronic respiratory failure, community-acquired pneumonia  requires home oxygen and CPAP Was intubated and mechanically ventilated - now extubated Currently on O2 by [place], maintaining SpO2  Management as per pulmonary/ICU team  # Hypernatremia Post ATN diuresis Continued on D5w MOnitor Na closely    LOS: [place] 10/1/20219:34 [place], [place]  Note: This note was prepared with Dragon dictation. Any transcription errors are unintentional

## 2020-07-17 NOTE — Progress Notes (Signed)
ID  BP (!) 152/82   Pulse 99   Temp 99.1 F (37.3 C) (Rectal)   Resp (!) 25   Ht 5' 7.01" (1.702 m)   Wt 87.5 kg   SpO2 100%   BMI 30.21 kg/m        confused Slurred and drawn speech Follows commands intermittently Weakness rt arm Moves left arm and rt leg more than the other 2 Foley catheter -blood stained urine Left foot- ischemia/necrosis of the dorsum of the foot Rt great and 2nd toe- ischemic area         CBC Latest Ref Rng & Units 07/17/2020 07/16/2020 07/15/2020  WBC 4.0 - 10.5 K/uL 26.0(H) 19.6(H) 13.7(H)  Hemoglobin 12.0 - 15.0 g/dL 13.8 13.1 13.1  Hematocrit 36 - 46 % 45.7 42.8 42.6  Platelets 150 - 400 K/uL 76(L) 77(L) 64(L)    CMP Latest Ref Rng & Units 07/17/2020 07/16/2020 07/16/2020  Glucose 70 - 99 mg/dL 234(H) - -  BUN 8 - 23 mg/dL 60(H) - -  Creatinine 0.44 - 1.00 mg/dL 1.49(H) - -  Sodium 135 - 145 mmol/L 154(H) 154(H) 150(H)  Potassium 3.5 - 5.1 mmol/L 4.4 - -  Chloride 98 - 111 mmol/L 109 - -  CO2 22 - 32 mmol/L 31 - -  Calcium 8.9 - 10.3 mg/dL 7.9(L) - -  Total Protein 6.5 - 8.1 g/dL - - -  Total Bilirubin 0.3 - 1.2 mg/dL - - -  Alkaline Phos 38 - 126 U/L - - -  AST 15 - 41 U/L - - -  ALT 0 - 44 U/L - - -    Micro  New encephalopathy and slurred speech- stroke questioned - brain imaging nothing acture but has chronic microvascular disease  Klebsiella bacteremia with sepsis due to UTI with rt pyelonpehritis due to rt obstructing ureteric stone causing hydo S/p stent on 07/15/20 No fever in 36 hrs but uptick in WBC today to 26 Repeat blood culture from 9/30 -aerococcus species reported- but when I called lab they said it was an error and culture negative so far  Continue ceftriaxone for near pan sensitive klebsiella  She was investigated for tick born illness as she was spiking and before we knew that she had obstructing stones- RMSF iGM positive but that is false positive - will not treat. Discussed with Dr.Hagg- pt has no risk  factor for TBI  Worsening leucocytosis- repeated Ct to look for any hematoma, abscess- none Could be due the ischemic foot, sterids and infection  Ischemia of the feet- left foot which looked like ecchymosis due to car door  injury is now looking like ischemia/necrosis-  Could it be emboli, vasculitis or hypoperfusion or pressor induced?? Recommend vascular consult    Hypernatremia  Severe thrombocytopenia-- improving  Abnormal lfts- could be methotrexate, sepsis-improving  AKi- on CKD- improving  Pulmonary sarcoidosis on methylprednsione ( was on methotrexate at home)  home oxygen  H/o left ureteric stone s/p lithotripsy and stent in May 2021 - stent removed after that H/o Over active bladder, recurrent UTI  Discussed her care with ICU team and her husband. ID will follow her remotely this weekend. Call if needed

## 2020-07-17 NOTE — Progress Notes (Signed)
Name: Stacie [name] MRN: [id number] DOB: December 04, 1955    ADMISSION DATE:  07/09/2020 INITIAL CONSULTATION DATE:  07/10/2020  REFERRING MD :  Dr. Owens Shark  CHIEF COMPLAINT:  Altered Mental Status & Shortness of Breath  BRIEF PATIENT DESCRIPTION:  64 y.o. Female admitted with Sepsis and Acute on Chronic Hypoxic Respiratory Failure in the setting of Community Acquired Pneumonia and Acute Exacerbation of Pulmonary Sarcoidosis requiring BiPAP. Pt also with UTI and AKI, Nephrology has been consulted.  SIGNIFICANT EVENTS  9/23: Presented to ED, placed on BiPAP 9/24: PCCM asked to admit 9/25: Klebsiella pneumoniae bacteremia due to UTI, associated encephalopathy 9/26: Persistent encephalopathy 9/27- Patient remains encephalopathic on BIPAP, she is oozing blood from peripheral veins and both LE toes are with ischemic changes which is new per husband.  I have called and updated Dr Kathyrn Sheriff regarding patients condition.  Concern is possible DIC onset since INR is incrementing up and PIV oozing on exam.  Patient is not on vasopressor support at this time. Restarted solumedrol due to chronic steroid therapy at home. Discussed case with Dr Candiss Norse nephrology and Dr Duwayne Heck.   9/28- patient with improved platelets this am, LFTs abnormal will have [place] RUQ today. ID on case s/p evaluation. Patient off sedation and is able to use BIPAP with AVAPS setting no overnight events.  Met with family at bedside including husband Dr Kathyrn Sheriff and daughter Stacie [name]. 07/15/20- patient is sedated this am we are weaning off sedatives and analgesics in prep for SBT to liberate from mechanical ventilator.  She is supported by 40 on neosynephrine this am, this is being weaned.  Na is increased to >150 plan is to start D5w and recheck BMP at 2pm.   07/16/20-  Patient has been altered with encephalopathy despite improvement in vitals and liberation off mechanical ventilation.  She was noted to have dysrarthria, right hemiparesis and right  facial drooping during my evaluation this am. Have called code stroke and neurologic evaluation is in progress s/p MRI/CTA brain and EEG.  07/17/20- patient is with aphasia and right sided weakness.  She was evaluated by neurologist today. She was unable to swallow appropriately.   STUDIES:  9/23: CXR>>Cardiac shadow is stable. Chronic scarring is noted in the apices bilaterally with hilar retraction consistent with the given clinical history of prior sarcoidosis. Patchy increased airspace opacities are noted in the lower lungs which may represent some acute on chronic infiltrate. Changes of prior vertebral augmentation are seen. No acute bony abnormality is noted. 9/24: CT Head>>Chronic white matter ischemic changes without acute abnormality 9/24: CT Chest w/o Contrast>>Chronic changes of pulmonary sarcoidosis with biapical predominant pulmonary fibrosis. Very mild superimposed active inflammatory infiltrate. Morphologic changes compatible with pulmonary arterial hypertension.  CULTURES: SARS-CoV-2 PCR 9/23>> negative Influenza A&B 9/23>>negative Respiratory Viral Panel 9/24>> negative Blood culture 9/23>> Klebsiella pneumoniae all 4 bottles Sputum 9/24>> unable to obtain, nonproductive Urine 9/23>> Klebsiella pneumonia Strep pneumo urinary antigen 9/24>> not obtained Legionella urinary antigen 9/24>> not obtained Aspergillus 9/24>> pending Fungitel 9/24>> pending C. Diff 9/24>> no sample  ANTIBIOTICS: Azithromycin x1 dose 9/23 Ceftazidime x1 dose 9/23 Cefepime 9/24>> 9/24 Vancomycin 9/24>> 9/24 Ceftriaxone 9/24>>  PATIENT PROFILE:   Stacie [name] is a 64 y.o. Female with a past medical history significant for pulmonary sarcoidosis (on 2L [place] at baseline), pulmonary hypertension, OSA, asthma, hypertension, hypothyroidism, IBS, and chronic kidney disease who presents to [facility] ED on 07/09/20 due to altered mental status and shortness of breath.  Pt is currently confused and unable  to contribute to history, and no family present, therefore history is obtained from ED and nursing notes.  PCCM asked to admit patient.  Patient has Klebsiella pneumonia bacteremia and severe sepsis.  Encephalopathy secondary to the same.   Allergies  Allergen Reactions  . Corn-Containing Products Diarrhea    headaches  . Nitrofurantoin Nausea Only  . Tramadol     Nausea and vomiting  . Sulfa Antibiotics Rash    As an infant   Scheduled Meds: . arformoterol  15 mcg Nebulization BID  . aspirin EC  81 mg Oral Daily  . budesonide (PULMICORT) nebulizer solution  0.25 mg Nebulization BID  . chlorhexidine  15 mL Mouth Rinse BID  . chlorhexidine gluconate (MEDLINE KIT)  15 mL Mouth Rinse BID  . Chlorhexidine Gluconate Cloth  6 each Topical Q0600  . docusate  100 mg Oral BID  . insulin aspart  0-15 Units Subcutaneous Q4H  . mouth rinse  15 mL Mouth Rinse 10 times per day  . methylPREDNISolone (SOLU-MEDROL) injection  40 mg Intravenous Daily  . pantoprazole (PROTONIX) IV  40 mg Intravenous QHS  . polyethylene glycol  17 g Oral Daily  . thiamine injection  100 mg Intravenous Daily  . Treprostinil  18 mcg Inhalation QID   Continuous Infusions: . calcium gluconate Stopped (07/17/20 0931)  . cefTRIAXone (ROCEPHIN)  IV Stopped (07/16/20 1921)  . dexmedetomidine (PRECEDEX) IV infusion Stopped (07/15/20 0655)  . dextrose 100 mL/hr at 07/17/20 1200  . fentaNYL infusion INTRAVENOUS Stopped (07/15/20 1023)   PRN Meds:.acetaminophen, docusate sodium, fentaNYL, polyethylene glycol   REVIEW OF SYSTEMS:   Unable to assess due to AMS  SUBJECTIVE:  Unable to assess due to AMS  VITAL SIGNS: Temp:  [97.5 F (36.4 C)-99.1 F (37.3 C)] 98.8 F (37.1 C) (10/01 0800) Pulse Rate:  [52-103] 95 (10/01 1000) Resp:  [13-27] 27 (10/01 1000) BP: (124-158)/(74-113) 147/75 (10/01 1000) SpO2:  [94 %-100 %] 100 % (10/01 1000)  PHYSICAL EXAMINATION: General:  Acute on chronically ill appearing female,  laying in bed, on BiPAP, with mild respiratory distress, confused Neuro: Confused, erratic following of commands, no focal deficits, pupils PERRL HEENT:  Atraumatic, normocephalic, neck supple, no JVD Cardiovascular: Regular rate and rhythm, s1s2, no M/R/G Lungs:  Diffuse rhonchi, no wheezing, mild tachypnea, mild assessory muscle use, BiPAP assisted Abdomen:  Obese, soft, nontender, nondistended, no guarding or rebound tenderness, BS+ x4 Musculoskeletal: No deformities, no edema Skin:  Warm and dry.  No obvious rashes, lesions, or ulcerations  Recent Labs  Lab 07/16/20 0706 07/16/20 0706 07/16/20 0930 07/16/20 0930 07/16/20 1329 07/16/20 2122 07/17/20 0314  NA 156*   < > 155*   < > 150* 154* 154*  K 4.4  --  4.3  --   --   --  4.4  CL 115*  --  114*  --   --   --  109  CO2 31  --  31  --   --   --  31  BUN 76*  --  77*  --   --   --  60*  CREATININE 1.92*  --  1.85*  --   --   --  1.49*  GLUCOSE 230*  --  237*  --   --   --  234*   < > = values in this interval not displayed.   Recent Labs  Lab 07/15/20 0433 07/16/20 0706 07/17/20 0314  HGB 13.1 13.1 13.8  HCT 42.6 42.8 45.7  WBC 13.7* 19.6* 26.0*  PLT 64* 77* 76*   ABG    Component Value Date/Time   PHART 7.41 07/16/2020 0841   PCO2ART 52 (H) 07/16/2020 0841   PO2ART 76 (L) 07/16/2020 0841   HCO3 33.0 (H) 07/16/2020 0841   TCO2 29 02/20/2020 1156   ACIDBASEDEF 0.5 07/12/2020 1205   O2SAT 95.2 07/16/2020 0841     ASSESSMENT / PLAN:  Acute on Chronic Hypoxic Respiratory Failure secondary to acute Exacerbation of Pulmonary Sarcoidosis/ALI -Supplemental O2 as needed to maintain O2 sats >92% -BiPAP, wean as tolerated -Follow intermittent CXR & ABG as needed -ABX as above-Rocephin - ID on case appreciate input -IV Steroids restarted-Solumedrol 40>>20 daily IV -LABA/ICS: Arformoterol and Pulmicort -continue Tyvasso as able   Acute metabolic encephalopathy Due to severe sepsis/gram-negative bacteremia-  klebsiella    - present on admission     - due to UTI Uremia Precedex General supportive care Restarted solumedrol  Support of renal function Thiamine Check free T4, TSH low -Rocephin/zithromax for abx - ID on case appreciate input 07/16/20- patient has signs of acute CVA- neuro workup in progress.   Severe sepsis secondary to Kleibsiella pneumoniae bactermia-improved  UTI, recurrent -Monitor fever curve -Trend WBC's & Procalcitonin -Follow cultures as above -Received Azithromycin & Ceftazidime in ED>>rocephin /zithromax -Discontinued Vancomycin & Cefepime -Continue Rocephin   Obstructing calculi of right kidney and nonobstructing calculi of left ureter.    - s/p surgical decompression with stent placement - Urology on case appreciate input   - creat improved this am    -s/p repeat CTabd/pelvis - no concerning findings to elucidate reason for worsening leukocytosis-07/17/20  AKI   -significantly improved -07/17/20 -Anion Gap Metabolic Acidosis, corrected -Monitor I&O's / urinary output -Follow BMP -Ensure adequate renal perfusion -Avoid nephrotoxic agents as able -Replace electrolytes as indicated -Na Bicarb alternated with normal saline per renal -Nephrology consulted, appreciate input   Mildly elevated troponin, suspected demand ischemia -Continuous cardiac monitoring -Maintain MAP >65 -EKG without ST changes   Transaminitis -NPO -Trend LFT's -Obtain Abdominal US   BEST PRACTICES DISPOSITION: ICU GOALS OF CARE: Full Code VTE Prophylaxis: Heparin SQ Consults: Nephrology Updates: No family at bedside for update 07/12/20 during MD rounds  Critical care provider statement:    Critical care time (minutes):  33   Critical care time was exclusive of:  Separately billable procedures and  treating other patients   Critical care was necessary to treat or prevent imminent or  life-threatening deterioration of the following conditions:  sepsis, klebsiella bacteremia,  multiple comorbid conditions.    Critical care was time spent personally by me on the following  activities:  Development of treatment plan with patient or surrogate,  discussions with consultants, evaluation of patient's response to  treatment, examination of patient, obtaining history from patient or  surrogate, ordering and performing treatments and interventions, ordering  and review of laboratory studies and re-evaluation of patient's condition   I assumed direction of critical care for this patient from another  provider in my specialty: no     Ottie Glazier, M.D.  [facility] [place]   07/17/2020, 2:03 PM  *This note was dictated using voice recognition software/Dragon.  Despite best efforts to proofread, errors can occur which can change the meaning.  Any change was purely unintentional.

## 2020-07-17 NOTE — Progress Notes (Signed)
Inpatient Diabetes Program Recommendations  AACE/ADA: New Consensus Statement on Inpatient Glycemic Control (2015)  Target Ranges:  Prepandial:   less than 140 mg/dL      Peak postprandial:   less than 180 mg/dL (1-2 hours)      Critically ill patients:  140 - 180 mg/dL   Results for Stacie [name], Stacie A "CATHY" (MRN [id number]) as of 07/17/2020 11:22  Ref. Range 07/16/2020 23:16 07/17/2020 04:26 07/17/2020 06:54 07/17/2020 07:58 07/17/2020 11:04  Glucose-Capillary Latest Ref Range: 70 - 99 mg/dL 144 (H)  1 unit NOVOLOG  265 (H)  5 units NOVOLOG  208 (H)  5 units NOVOLOG  201 (H)  5 units NOVOLOG  217 (H)     History: Sarcoidosis, CKD  No History of Diabetes--Current A1c= 6.4%--Takes Chronic Prednisone at Home  Current Insulin Orders: Novolog Moderate Correction Scale/ SSI (0-15 units) Q4 hours    MD- Note patient getting Solumedrol 40 mg Daily.  CBGs >200  Please consider adding low dose basal insulin to help with glucose control:  Levemir 4 units BID (0.1 units/kg)    --Will follow patient during hospitalization--  Wyn Quaker RN, MSN, CDE Diabetes Coordinator Inpatient Glycemic Control Team Team Pager: [phone] (8a-5p)

## 2020-07-17 NOTE — Evaluation (Signed)
Clinical/Bedside Swallow Evaluation Patient Details  Name: Stacie [name] MRN: [id number] Date of Birth: [birth year]  Today's Date: 07/17/2020 Time: SLP Start Time (ACUTE ONLY): 1213 SLP Stop Time (ACUTE ONLY): 1306 SLP Time Calculation (min) (ACUTE ONLY): 53 min  Past Medical History:  Past Medical History:  Diagnosis Date  . Arrhythmia    patient unaware if this is current  . Asthma   . GERD (gastroesophageal reflux disease)   . Heart murmur   . History of kidney stones   . HOH (hard of hearing)    wear aids  . Hypothyroidism   . IBS (irritable bowel syndrome)   . Pulmonary hypertension (HCC)   . Sarcoid   . Sarcoidosis   . Seasonal allergies   . Sleep apnea CPAP with O2  . Wears hearing aid in both ears    Past Surgical History:  Past Surgical History:  Procedure Laterality Date  . CARDIAC CATHETERIZATION  10/18/2018   [facility]  . CATARACT EXTRACTION W/PHACO Left 07/31/2019   Procedure: CATARACT EXTRACTION PHACO AND INTRAOCULAR LENS PLACEMENT (IOC) LEFT 00:51.1  17.9%  9.15;  Surgeon: Lockie Mola, MD;  Location: [facility];  Service: Ophthalmology;  Laterality: Left;  keep this patient second  . COLON SURGERY     "colon was fused to bladder - operated on both"  . COLONOSCOPY  09/18/2007   diverticuli, no polyps  . COLONOSCOPY  05/26/2010   diverticuli, no polyps  . CYSTOSCOPY WITH STENT PLACEMENT Bilateral 07/14/2020   Procedure: CYSTOSCOPY WITH STENT PLACEMENT, RETROPYLOGRAM;  Surgeon: Sondra Come, MD;  Location: [facility];  Service: Urology;  Laterality: Bilateral;  . CYSTOSCOPY/URETEROSCOPY/HOLMIUM LASER/STENT PLACEMENT Left 02/20/2020   Procedure: CYSTOSCOPY/URETEROSCOPY/LITHOTRIPSY /STENT PLACEMENT;  Surgeon: Vanna Scotland, MD;  Location: [facility];  Service: Urology;  Laterality: Left;  . PARS PLANA VITRECTOMY Right 05/20/2015   Procedure: PARS PLANA VITRECTOMY WITH 25 GAUGE, laser;  Surgeon: Marcelene Butte, MD;  Location: [facility];  Service:  Ophthalmology;  Laterality: Right;  . PARTIAL HYSTERECTOMY  1990  . TUBAL LIGATION     HPI:  64 y.o. Female admitted with Sepsis and Acute on Chronic Hypoxic Respiratory Failure in the setting of Community Acquired Pneumonia and Acute Exacerbation of Pulmonary Sarcoidosis requiring BiPAP. Pt also with UTI and AKI. Pt currently altered but has progressed to 2L nasal cannula.    Assessment / Plan / Recommendation Clinical Impression  Pt continues to be pleasantly confused but is much improved over previous day. Pt is at high risk of aspiration given cognitive status, respiratory compromise and consistent overt s/s of aspiration with trials of ice chips, thin liquids via spoon and straw. When consuming these trials, pt demonstrates a consistently delayed cough and her RR rate consistently increased from 16 to 27. At this time, continue to recommend strict NPO with ST following pt. Pt's husband and daughter were at bedside and education provided on cognitive strategies to promote cognitive stimulation such as helping pt wash her face using hand over hand and trying to create a routine of stimulation/lights on and then a nap and lights on again. All questions answered to their satisfaction, POC communicated to pt's attending and nurse. Will continue trials of ice chips tomorrow.  SLP Visit Diagnosis: Dysphagia, oropharyngeal phase (R13.12)    Aspiration Risk  Severe aspiration risk    Diet Recommendation NPO   Medication Administration: Via alternative means    Other  Recommendations Oral Care Recommendations: Oral care QID   Follow up Recommendations  (TBD)  Frequency and Duration min 3x week  2 weeks       Prognosis Prognosis for Safe Diet Advancement: Good      Swallow Study   General Date of Onset: 07/17/20 HPI: 64 y.o. Female admitted with Sepsis and Acute on Chronic Hypoxic Respiratory Failure in the setting of Community Acquired Pneumonia and Acute Exacerbation of Pulmonary  Sarcoidosis requiring BiPAP. Pt also with UTI and AKI. Pt currently altered but has progressed to 2L nasal cannula.  Type of Study: Bedside Swallow Evaluation Previous Swallow Assessment: none in chart Diet Prior to this Study: NPO Temperature Spikes Noted: No Respiratory Status: Nasal cannula (2 liters) History of Recent Intubation: Yes Length of Intubations (days): 1 days (for proceedure only) Date extubated: 07/15/20 Behavior/Cognition: Alert;Confused;Doesn't follow directions;Requires cueing;Distractible Oral Cavity Assessment: Within Functional Limits Oral Care Completed by SLP: Recent completion by staff Oral Cavity - Dentition: Adequate natural dentition Vision:  (Total assistance d/t AMS) Self-Feeding Abilities: Total assist (d/t AMS) Patient Positioning: Upright in bed Baseline Vocal Quality: Normal Volitional Cough: Cognitively unable to elicit Volitional Swallow: Unable to elicit    Oral/Motor/Sensory Function Overall Oral Motor/Sensory Function: Within functional limits   Ice Chips Ice chips: Impaired Presentation: Spoon Pharyngeal Phase Impairments: Cough - Delayed Other Comments: overall increased RR after 3 to 4 trials   Thin Liquid Thin Liquid: Impaired Presentation: Spoon;Straw Pharyngeal  Phase Impairments: Cough - Delayed Other Comments:  (RR elevated after consumption)    Nectar Thick Nectar Thick Liquid: Not tested   Honey Thick Honey Thick Liquid: Not tested   Puree Puree: Not tested   Solid     Solid: Not tested     Trae Bovenzi B. Dreama Saa M.S., CCC-SLP, [facility] Speech-Language Pathologist Rehabilitation Services Office [phone]  Reynaldo Rossman 07/17/2020,3:49 PM

## 2020-07-18 ENCOUNTER — Inpatient Hospital Stay: Payer: BC Managed Care – PPO

## 2020-07-18 DIAGNOSIS — R4701 Aphasia: Secondary | ICD-10-CM | POA: Diagnosis not present

## 2020-07-18 LAB — BASIC METABOLIC PANEL
Anion gap: 15 (ref 5–15)
BUN: 51 mg/dL — ABNORMAL HIGH (ref 8–23)
CO2: 28 mmol/L (ref 22–32)
Calcium: 7.9 mg/dL — ABNORMAL LOW (ref 8.9–10.3)
Chloride: 103 mmol/L (ref 98–111)
Creatinine, Ser: 1.32 mg/dL — ABNORMAL HIGH (ref 0.44–1.00)
GFR calc Af Amer: 49 mL/min — ABNORMAL LOW (ref >60)
GFR calc non Af Amer: 42 mL/min — ABNORMAL LOW (ref >60)
Glucose, Bld: 194 mg/dL — ABNORMAL HIGH (ref 70–99)
Potassium: 4.3 mmol/L (ref 3.5–5.1)
Sodium: 146 mmol/L — ABNORMAL HIGH (ref 135–145)

## 2020-07-18 LAB — CBC WITH DIFFERENTIAL/PLATELET
Abs Immature Granulocytes: 0.96 10*3/uL — ABNORMAL HIGH (ref 0.00–0.07)
Basophils Absolute: 0.2 10*3/uL — ABNORMAL HIGH (ref 0.0–0.1)
Basophils Relative: 1 %
Eosinophils Absolute: 0 10*3/uL (ref 0.0–0.5)
Eosinophils Relative: 0 %
HCT: 45.3 % (ref 36.0–46.0)
Hemoglobin: 13.8 g/dL (ref 12.0–15.0)
Immature Granulocytes: 4 %
Lymphocytes Relative: 3 %
Lymphs Abs: 0.6 10*3/uL — ABNORMAL LOW (ref 0.7–4.0)
MCH: 30.8 pg (ref 26.0–34.0)
MCHC: 30.5 g/dL (ref 30.0–36.0)
MCV: 101.1 fL — ABNORMAL HIGH (ref 80.0–100.0)
Monocytes Absolute: 1.7 10*3/uL — ABNORMAL HIGH (ref 0.1–1.0)
Monocytes Relative: 8 %
Neutro Abs: 19.3 10*3/uL — ABNORMAL HIGH (ref 1.7–7.7)
Neutrophils Relative %: 84 %
Platelets: 83 10*3/uL — ABNORMAL LOW (ref 150–400)
RBC: 4.48 MIL/uL (ref 3.87–5.11)
RDW: 14.3 % (ref 11.5–15.5)
WBC: 22.9 10*3/uL — ABNORMAL HIGH (ref 4.0–10.5)
nRBC: 0.1 % (ref 0.0–0.2)

## 2020-07-18 LAB — MAGNESIUM: Magnesium: 1.9 mg/dL (ref 1.7–2.4)

## 2020-07-18 LAB — GLUCOSE, CAPILLARY
Glucose-Capillary: 211 mg/dL — ABNORMAL HIGH (ref 70–99)
Glucose-Capillary: 185 mg/dL — ABNORMAL HIGH (ref 70–99)
Glucose-Capillary: 212 mg/dL — ABNORMAL HIGH (ref 70–99)
Glucose-Capillary: 214 mg/dL — ABNORMAL HIGH (ref 70–99)
Glucose-Capillary: 210 mg/dL — ABNORMAL HIGH (ref 70–99)

## 2020-07-18 LAB — PHOSPHORUS: Phosphorus: 2.7 mg/dL (ref 2.5–4.6)

## 2020-07-18 NOTE — Evaluation (Signed)
Occupational Therapy Evaluation Patient Details Name: Stacie [name] MRN: [id number] DOB: [birth year] Today's Date: 07/18/2020    History of Present Illness Stacie [name] is a 64 y.o. Female with a past medical history significant for pulmonary sarcoidosis (on 2L [place] at baseline), pulmonary hypertension, OSA, asthma, hypertension, hypothyroidism, IBS, and chronic kidney disease who presents to [facility] ED on 07/09/20 due to altered mental status and shortness of breath.  Pt is currently confused and unable to contribute to history, and no family present,   Clinical Impression   Stacie [name] was seen for OT evaluation this date. Prior to hospital admission, pt was MOD I for mobility and I/ADLs using home O2, including driving. Pt lives c husband - unable to obtain home setup from pt this date. Pt presents to acute OT demonstrating impaired ADL performance, functional cognition, and functional mobility 2/2 functional strength/ROM/balance deficits, decreased activity tolerance, impaired functional use of BUE, and impaired FMC.  Pt was A&O x4 (required cues for year), however does not recall family recently in room this AM. Pt denies pain despite grimacing and flinching during RUE/RLE PROM. Answers yes/no questions inconsitently t/o session. Pt currently requires MOD A + HOH (for R and L hand trials) self-drinking thickened liquid at bed level - assist for shoulder/wrist flexion. MAX A + HOH for face washing at bed level. O2 removed for face washing - desat 90% on RA, resolved to 100% c 2L [place]. TOTAL A for LBD at bed level. MOD A + HOH + Rail to adjust torso position. Pt would benefit from skilled OT to address noted impairments and functional limitations (see below for any additional details) in order to maximize safety and independence while minimizing falls risk and caregiver burden. Upon hospital discharge, recommend CIR to maximize pt safety and return to functional independence during meaningful occupations  of daily life.     Follow Up Recommendations  CIR    Equipment Recommendations  Other (comment) (TBD at next venue)    Recommendations for Other Services Rehab consult     Precautions / Restrictions Precautions Precautions: Fall Restrictions Weight Bearing Restrictions: No      Mobility Bed Mobility Overal bed mobility: Needs Assistance     General bed mobility comments: MOD A + HOH + Rail to adjust torso position   Transfers     General transfer comment: not tested        ADL either performed or assessed with clinical judgement   ADL Overall ADL's : Needs assistance/impaired       General ADL Comments: MOD A + HOH self-drinking thickened liquid at bed level. MAX A + HOH for face washing at bed level. TOTAL A for LBD at bed level                   Pertinent Vitals/Pain Pain Assessment: No/denies pain Faces Pain Scale: Hurts little more Pain Location: BLE c movement Pain Descriptors / Indicators: Grimacing;Discomfort Pain Intervention(s): Limited activity within patient's tolerance     Hand Dominance Right   Extremity/Trunk Assessment Upper Extremity Assessment Upper Extremity Assessment: RUE deficits/detail;LUE deficits/detail RUE Deficits / Details: Grip 3-/5. Biceps 3-/5. Deltoids 2/5. PROM WFL. Unable to achieve opposition  LUE Deficits / Details: Grip 4/5. Biceps 3+/5. Deltoids 3+/5. PROM WFL. Achieves thumb/2nd digit opposition only - unable to sequence further   Lower Extremity Assessment Lower Extremity Assessment: RLE deficits/detail;LLE deficits/detail (Wiggles toes on command, unable to hold DF position )       Communication Communication Communication:  No difficulties   Cognition Arousal/Alertness: Awake/alert   Overall Cognitive Status: Difficult to assess Area of Impairment: Memory;Following commands;Safety/judgement;Awareness;Problem solving       Memory: Decreased short-term memory Following Commands: Follows one step  commands inconsistently Safety/Judgement: Decreased awareness of safety;Decreased awareness of deficits   Problem Solving: Slow processing;Decreased initiation;Difficulty sequencing;Requires verbal cues;Requires tactile cues General Comments: Pt A&O x4 however does not recall family recently in room this AM. Pt denies pain despite grimacing and flinching away from stimuli to foot. Answers yes/no inconsitently to questions t/o session    General Comments  O2 removed for face washing - desat 90% on RA, resolved to 100% c 2L [place]    Exercises Exercises: Other exercises Other Exercises Other Exercises: Pt educated re: OT role, ECS, HEP, importance of mobility for functional strengthening Other Exercises: Self-feeding, grooming, bed mobility, LBD   Shoulder Instructions      Home Living Family/patient expects to be discharged to:: Private residence Living Arrangements: Spouse/significant other (husband is MD)          Additional Comments: Pt poor historian      Prior Functioning/Environment       Comments: Per SLP: husband reports pt was MOD I for mobility and ADLs using home O2. Drives.        OT Problem List: Decreased strength;Decreased range of motion;Decreased activity tolerance;Impaired balance (sitting and/or standing);Decreased coordination;Impaired UE functional use     OT Treatment/Interventions: Self-care/ADL training;Therapeutic exercise;Neuromuscular education;Energy conservation;DME and/or AE instruction;Therapeutic activities;Patient/family education;Balance training;Cognitive remediation/compensation    OT Goals(Current goals can be found in the care plan section) Acute Rehab OT Goals Patient Stated Goal: To go to sleep  OT Goal Formulation: With patient Time For Goal Achievement: 08/01/20 Potential to Achieve Goals: Fair ADL Goals Pt Will Perform Eating: bed level;with min guard assist Pt Will Perform Upper Body Bathing: sitting;with mod assist Pt Will Transfer  to Toilet: with mod assist (bed level)  OT Frequency: Min 2X/week    AM-PAC OT "6 Clicks" Daily Activity     Outcome Measure Help from another person eating meals?: A Lot Help from another person taking care of personal grooming?: A Lot Help from another person toileting, which includes using toliet, bedpan, or urinal?: Total Help from another person bathing (including washing, rinsing, drying)?: A Lot Help from another person to put on and taking off regular upper body clothing?: A Lot Help from another person to put on and taking off regular lower body clothing?: Total 6 Click Score: 10   End of Session Equipment Utilized During Treatment: Oxygen (2L [place])  Activity Tolerance: Patient limited by fatigue Patient left: in bed;with call bell/phone within reach  OT Visit Diagnosis: Other abnormalities of gait and mobility (R26.89);Muscle weakness (generalized) (M62.81)                Time: 1994-1290 OT Time Calculation (min): 21 min Charges:  OT General Charges $OT Visit: 1 Visit OT Evaluation $OT Eval Moderate Complexity: 1 Mod OT Treatments $Self Care/Home Management : 8-22 mins  Dessie Coma, M.S. OTR/L  07/18/20, 1:21 PM  ascom [phone]

## 2020-07-18 NOTE — Progress Notes (Signed)
PHARMACY CONSULT NOTE - FOLLOW UP  Pharmacy Consult for Electrolyte Monitoring and Replacement   Recent Labs: Potassium (mmol/L)  Date Value  07/18/2020 4.3  12/28/2012 3.4 (L)   Magnesium (mg/dL)  Date Value  07/18/2020 1.9   Calcium (mg/dL)  Date Value  07/18/2020 7.9 (L)   Calcium, Total (mg/dL)  Date Value  12/28/2012 8.0 (L)   Albumin (g/dL)  Date Value  07/17/2020 2.8 (L)  05/27/2019 4.1  12/26/2012 3.0 (L)   Phosphorus (mg/dL)  Date Value  07/18/2020 2.7   Sodium (mmol/L)  Date Value  07/18/2020 146 (H)  05/27/2019 144  12/28/2012 142    Assessment: Pharmacy consulted to replace electrolytes in this 64 year old female admitted with sepsis of unknown source.. Pt with PMH of Stage 3 CKD and baseline Scr of 1.0 in May 2021. Nephrology is following.  9/27 Electrolytes WNL, with sodium slightly elevated at 147, on NS at 75 mL/hr ordered to stop tonight at 2359 . Scr elevated but trending down (4.49>>3.75>>3.12) with good UOP yesterday.Pt placed on Calcium Gluconate 1g twice daily.  9/28: Sodium continues to trend up, at 150 today. Not on maintenance fluids. Calcium improving on calcium gluconate 1g twice daily. Kidney function improving with good UOP.   9/29 D5 at 40 ml/hr added for hypernatremia, Na 150>156  9/30 Na 156>>155, D5 increased to 75 mL/hr overnight. Other electrolytes WNL. Pt NPO. Scr trending down 2.46>>2.18>>1.92 and having good UOP.   10/1: D5W at 100 ml/hr, Na 154 on morning BMP. Phos slightly low but unable to take PO, IV replacement limited by Na and K.  Goal of Therapy:  Electrolytes WNL   Plan:  D5W at 100 ml/hr, Na 146, Scr 1.32 on morning BMP. No electrolyte replacement at this time. Continue to follow along.  Jeannine Pennisi A, PharmD 07/18/2020 9:15 AM

## 2020-07-18 NOTE — Progress Notes (Signed)
Name: Stacie [name] MRN: [id number] DOB: 12-15-1955    ADMISSION DATE:  07/09/2020 INITIAL CONSULTATION DATE:  07/10/2020  REFERRING MD :  Dr. Owens Shark  CHIEF COMPLAINT:  Altered Mental Status & Shortness of Breath  BRIEF PATIENT DESCRIPTION:  64 y.o. Female admitted with Sepsis and Acute on Chronic Hypoxic Respiratory Failure in the setting of Community Acquired Pneumonia and Acute Exacerbation of Pulmonary Sarcoidosis requiring BiPAP. Pt also with UTI and AKI, Nephrology has been consulted.  SIGNIFICANT EVENTS  9/23: Presented to ED, placed on BiPAP 9/24: PCCM asked to admit 9/25: Klebsiella pneumoniae bacteremia due to UTI, associated encephalopathy 9/26: Persistent encephalopathy 9/27- Patient remains encephalopathic on BIPAP, she is oozing blood from peripheral veins and both LE toes are with ischemic changes which is new per husband.  I have called and updated Dr Kathyrn Sheriff regarding patients condition.  Concern is possible DIC onset since INR is incrementing up and PIV oozing on exam.  Patient is not on vasopressor support at this time. Restarted solumedrol due to chronic steroid therapy at home. Discussed case with Dr Candiss Norse nephrology and Dr Duwayne Heck.   9/28- patient with improved platelets this am, LFTs abnormal will have [place] RUQ today. ID on case s/p evaluation. Patient off sedation and is able to use BIPAP with AVAPS setting no overnight events.  Met with family at bedside including husband Dr Kathyrn Sheriff and daughter [name]. 07/15/20- patient is sedated this am we are weaning off sedatives and analgesics in prep for SBT to liberate from mechanical ventilator.  She is supported by 40 on neosynephrine this am, this is being weaned.  Na is increased to >150 plan is to start D5w and recheck BMP at 2pm.   07/16/20-  Patient has been altered with encephalopathy despite improvement in vitals and liberation off mechanical ventilation.  She was noted to have dysrarthria, right hemiparesis and right  facial drooping during my evaluation this am. Have called code stroke and neurologic evaluation is in progress s/p MRI/CTA brain and EEG.  07/17/20- patient is with aphasia and right sided weakness.  She was evaluated by neurologist today. She was unable to swallow appropriately.  07/18/20- patient is able to speak better today. She had repeat swallow evaluation and did much better with ability to initiate diet PO  STUDIES:  9/23: CXR>>Cardiac shadow is stable. Chronic scarring is noted in the apices bilaterally with hilar retraction consistent with the given clinical history of prior sarcoidosis. Patchy increased airspace opacities are noted in the lower lungs which may represent some acute on chronic infiltrate. Changes of prior vertebral augmentation are seen. No acute bony abnormality is noted. 9/24: CT Head>>Chronic white matter ischemic changes without acute abnormality 9/24: CT Chest w/o Contrast>>Chronic changes of pulmonary sarcoidosis with biapical predominant pulmonary fibrosis. Very mild superimposed active inflammatory infiltrate. Morphologic changes compatible with pulmonary arterial hypertension.  CULTURES: SARS-CoV-2 PCR 9/23>> negative Influenza A&B 9/23>>negative Respiratory Viral Panel 9/24>> negative Blood culture 9/23>> Klebsiella pneumoniae all 4 bottles Sputum 9/24>> unable to obtain, nonproductive Urine 9/23>> Klebsiella pneumonia Strep pneumo urinary antigen 9/24>> not obtained Legionella urinary antigen 9/24>> not obtained Aspergillus 9/24>> pending Fungitel 9/24>> pending C. Diff 9/24>> no sample  ANTIBIOTICS: Azithromycin x1 dose 9/23 Ceftazidime x1 dose 9/23 Cefepime 9/24>> 9/24 Vancomycin 9/24>> 9/24 Ceftriaxone 9/24>>  PATIENT PROFILE:   Stacie [name] is a 64 y.o. Female with a past medical history significant for pulmonary sarcoidosis (on 2L [place] at baseline), pulmonary hypertension, OSA, asthma, hypertension, hypothyroidism, IBS, and chronic kidney  disease  who presents to [facility] ED on 07/09/20 due to altered mental status and shortness of breath.  Pt is currently confused and unable to contribute to history, and no family present, therefore history is obtained from ED and nursing notes.  PCCM asked to admit patient.  Patient has Klebsiella pneumonia bacteremia and severe sepsis.  Encephalopathy secondary to the same.   Allergies  Allergen Reactions  . Corn-Containing Products Diarrhea    headaches  . Nitrofurantoin Nausea Only  . Tramadol     Nausea and vomiting  . Sulfa Antibiotics Rash    As an infant   Scheduled Meds: . arformoterol  15 mcg Nebulization BID  . aspirin EC  81 mg Oral Daily  . budesonide (PULMICORT) nebulizer solution  0.25 mg Nebulization BID  . chlorhexidine  15 mL Mouth Rinse BID  . chlorhexidine gluconate (MEDLINE KIT)  15 mL Mouth Rinse BID  . Chlorhexidine Gluconate Cloth  6 each Topical Q0600  . docusate  100 mg Oral BID  . insulin aspart  0-15 Units Subcutaneous Q4H  . mouth rinse  15 mL Mouth Rinse 10 times per day  . methylPREDNISolone (SOLU-MEDROL) injection  20 mg Intravenous Daily  . pantoprazole (PROTONIX) IV  40 mg Intravenous QHS  . polyethylene glycol  17 g Oral Daily  . thiamine injection  100 mg Intravenous Daily  . Treprostinil  18 mcg Inhalation QID   Continuous Infusions: . cefTRIAXone (ROCEPHIN)  IV 2 g (07/17/20 1709)  . dextrose 100 mL/hr at 07/18/20 1218   PRN Meds:.acetaminophen, docusate sodium, polyethylene glycol   REVIEW OF SYSTEMS:   Unable to assess due to AMS  SUBJECTIVE:  Unable to assess due to AMS  VITAL SIGNS: Temp:  [98.3 F (36.8 C)-98.8 F (37.1 C)] 98.3 F (36.8 C) (10/02 1130) Pulse Rate:  [89-97] 94 (10/02 1230) Resp:  [18-28] 28 (10/02 1230) BP: (130-147)/(66-87) 140/66 (10/02 1230) SpO2:  [94 %-100 %] 100 % (10/02 1230)  PHYSICAL EXAMINATION: General:  Acute on chronically ill appearing female, laying in bed, on BiPAP, with mild respiratory  distress, confused Neuro: Confused, erratic following of commands, no focal deficits, pupils PERRL HEENT:  Atraumatic, normocephalic, neck supple, no JVD Cardiovascular: Regular rate and rhythm, s1s2, no M/R/G Lungs:  Diffuse rhonchi, no wheezing, mild tachypnea, mild assessory muscle use, BiPAP assisted Abdomen:  Obese, soft, nontender, nondistended, no guarding or rebound tenderness, BS+ x4 Musculoskeletal: No deformities, no edema Skin:  Warm and dry.  No obvious rashes, lesions, or ulcerations  Recent Labs  Lab 07/16/20 0930 07/16/20 1329 07/16/20 2122 07/17/20 0314 07/18/20 0424  NA 155*   < > 154* 154* 146*  K 4.3  --   --  4.4 4.3  CL 114*  --   --  109 103  CO2 31  --   --  31 28  BUN 77*  --   --  60* 51*  CREATININE 1.85*  --   --  1.49* 1.32*  GLUCOSE 237*  --   --  234* 194*   < > = values in this interval not displayed.   Recent Labs  Lab 07/16/20 0706 07/17/20 0314 07/18/20 0424  HGB 13.1 13.8 13.8  HCT 42.8 45.7 45.3  WBC 19.6* 26.0* 22.9*  PLT 77* 76* 83*   ABG    Component Value Date/Time   PHART 7.41 07/16/2020 0841   PCO2ART 52 (H) 07/16/2020 0841   PO2ART 76 (L) 07/16/2020 0841   HCO3 33.0 (H) 07/16/2020 0454  TCO2 29 02/20/2020 1156   ACIDBASEDEF 0.5 07/12/2020 1205   O2SAT 95.2 07/16/2020 0841     ASSESSMENT / PLAN:  Acute on Chronic Hypoxic Respiratory Failure secondary to acute Exacerbation of Pulmonary Sarcoidosis/ALI -Supplemental O2 as needed to maintain O2 sats >92% -BiPAP, wean as tolerated -Follow intermittent CXR & ABG as needed -ABX as above-Rocephin - ID on case appreciate input -IV Steroids restarted-Solumedrol 40>>20 daily IV -LABA/ICS: Arformoterol and Pulmicort -continue Tyvasso as able   Acute metabolic encephalopathy Due to severe sepsis/gram-negative bacteremia- klebsiella    - present on admission     - due to UTI Uremia Precedex General supportive care Restarted solumedrol  Support of renal  function Thiamine Check free T4, TSH low -Rocephin/zithromax for abx - ID on case appreciate input 07/16/20- patient has signs of acute CVA- neuro workup in progress.  10/2- mentation and aphasia is improved, swallow study is betteter with advance in progress.   Severe sepsis secondary to Kleibsiella pneumoniae bactermia-improved  UTI, recurrent -Monitor fever curve -Trend WBC's & Procalcitonin -Follow cultures as above -Received Azithromycin & Ceftazidime in ED>>rocephin /zithromax -Discontinued Vancomycin & Cefepime -Continue Rocephin   Obstructing calculi of right kidney and nonobstructing calculi of left ureter.    - s/p surgical decompression with stent placement - Urology on case appreciate input   - creat improved this am    -s/p repeat CTabd/pelvis - no concerning findings to elucidate reason for worsening leukocytosis-07/17/20  AKI   -significantly improved -07/17/20 -Anion Gap Metabolic Acidosis, corrected -Monitor I&O's / urinary output -Follow BMP -Ensure adequate renal perfusion -Avoid nephrotoxic agents as able -Replace electrolytes as indicated -Na Bicarb alternated with normal saline per renal -Nephrology consulted, appreciate input   Mildly elevated troponin, suspected demand ischemia -Continuous cardiac monitoring -Maintain MAP >65 -EKG without ST changes   Transaminitis -NPO -Trend LFT's -Obtain Abdominal US   BEST PRACTICES DISPOSITION: ICU GOALS OF CARE: Full Code VTE Prophylaxis: Heparin SQ Consults: Nephrology Updates: No family at bedside for update 07/12/20 during MD rounds  Critical care provider statement:    Critical care time (minutes):  33   Critical care time was exclusive of:  Separately billable procedures and  treating other patients   Critical care was necessary to treat or prevent imminent or  life-threatening deterioration of the following conditions:  sepsis, klebsiella bacteremia, multiple comorbid conditions.    Critical  care was time spent personally by me on the following  activities:  Development of treatment plan with patient or surrogate,  discussions with consultants, evaluation of patient's response to  treatment, examination of patient, obtaining history from patient or  surrogate, ordering and performing treatments and interventions, ordering  and review of laboratory studies and re-evaluation of patient's condition   I assumed direction of critical care for this patient from another  provider in my specialty: no     Ottie Glazier, M.D.  [facility] [place]   07/18/2020, 2:43 PM  *This note was dictated using voice recognition software/Dragon.  Despite best efforts to proofread, errors can occur which can change the meaning.  Any change was purely unintentional.

## 2020-07-18 NOTE — Progress Notes (Signed)
Speech Language Pathology Treatment: Dysphagia  Patient Details Name: Stacie [name] MRN: [id number] DOB: 04-Jun-1956 Today's Date: 07/18/2020 Time: 4098-1191 SLP Time Calculation (min) (ACUTE ONLY): 30 min  Assessment / Plan / Recommendation Clinical Impression  Pt's mentation is greatly improved from yesterday's session. She was able to sustain attention to this writer and answered basic questions about self and PO trials accurately. She was able to follow directions to open her mouth for visual inspection. She was not able to recall family's visit early in the day. Pt's voice is much stronger today and remained clear throughout all trials. Before PO trials her vitals were 98% O2 on 2L nasal cannula and RR 26 (per her husband pt's RR is always around upper 20's). Pt in no apparent distress. When consuming multiple trials of puree and nectar thick liquids via spoon but with appearance of swift swallow initiation and no oral residue post swallow. Vitals remained steady. Lots of trials provided to assess pt's endurance. When consuming ice chips, pt's RR and WOB increased (RR 35). At this time, recommend initiating dysphagia 1 diet with nectar thick liquids via spoon, medicine via IV (to compensate for fluctuates in mentation) but maybe given crushed in puree if IV fails. Current goal is not for pt to consume ~ 50, 75 or 100% of meal. Education provided to pt's nurse, MD, husband and her daughter on need to pace consumption and increase as pt indicates. ST to continue to follow diet management and possible need for cognitive linguistic evaluation.    HPI HPI: 64 y.o. Female admitted with Sepsis and Acute on Chronic Hypoxic Respiratory Failure in the setting of Community Acquired Pneumonia and Acute Exacerbation of Pulmonary Sarcoidosis requiring BiPAP. Pt also with UTI and AKI. Pt currently altered but has progressed to 2L nasal cannula.       SLP Plan  Continue with current plan of care        Recommendations  Diet recommendations: Dysphagia 1 (puree);Nectar-thick liquid Liquids provided via: Teaspoon Medication Administration: Via alternative means Supervision: Full supervision/cueing for compensatory strategies;Staff to assist with self feeding Compensations: Minimize environmental distractions;Slow rate;Small sips/bites Postural Changes and/or Swallow Maneuvers: Seated upright 90 degrees                General recommendations: Rehab consult Oral Care Recommendations: Oral care before and after PO Follow up Recommendations: Inpatient Rehab SLP Visit Diagnosis: Dysphagia, oropharyngeal phase (R13.12) Plan: Continue with current plan of care       GO           Stacie [name] B. Dreama Saa M.S., CCC-SLP, [facility] Speech-Language Pathologist Rehabilitation Services Office [phone]  Stacie [name] 07/18/2020, 12:41 PM

## 2020-07-18 NOTE — Evaluation (Signed)
Physical Therapy Evaluation Patient Details Name: Stacie [name] MRN: [id number] DOB: 05/28/1956 Today's Date: 07/18/2020   History of Present Illness  Stacie [name] is a 64 y.o. Female with a past medical history significant for pulmonary sarcoidosis (on 2L [place] at baseline), pulmonary hypertension, OSA, asthma, hypertension, hypothyroidism, IBS, and chronic kidney disease who presents to [facility] ED on 07/09/20 due to altered mental status and shortness of breath.  Admitted for management of acute hypoxic respiratory failure and sepsis related to CAP, UTI.  Hospital course significant for periods of delirium requiring precedex infusion; questionable DIC with ischemic changes to L foot, R great toe; cystoscopy with pyelogram (9/28); extubation (9/29); and acute code stroke (9/30) due to new-onset slurred speech, R hemiparesis (NIHSS 16).  Clinical Impression  Patient resting with eyes closed upon arrival to room, but easily awakens and agrees to participation with session.  Oriented to self and general location as hospital; inconsistently follows simple, verbal commands, often requiring increased time and repetition of command for completion. Patient globally weak and deconditioned throughout all extremities, but with increased weakness throughout R hemi-body.  Marked delays in muscular activation/coordination; mild apraxia noted at times.  Participated with supine LE therex, but requires constant cuing for redirection to and attention to task at hand.  Difficulty sustaining focus for consistent active effort with task (improved with ability to visualize LEs-HOB elevated).  Requiring max/total assist for rolling in bed; max/total for positioning of R hemi-body and initiation/completion of rotation.  Additional mobility deferred at this time due to need for toileting (placed on bedpan with CNA).  Will continue to assess/progress mobility as able in subsequent sessions.  Anticipate need for extensive assist +2  for future transfer attempts.    Follow Up Recommendations CIR    Equipment Recommendations       Recommendations for Other Services       Precautions / Restrictions Precautions Precautions: Fall Restrictions Weight Bearing Restrictions: No      Mobility  Bed Mobility Overal bed mobility: Needs Assistance Bed Mobility: Rolling Rolling: Total assist;Max assist;+2 for safety/equipment         General bed mobility comments: max/dep assist for position of hemi-body to initiate and complete rolling  Transfers                 General transfer comment: deferred secondary to patient request for bedpan  Ambulation/Gait             General Gait Details: deferred secondary to patient request for bedpan; unable to complete due to weakness  Stairs            Wheelchair Mobility    Modified Rankin (Stroke Patients Only)       Balance                                             Pertinent Vitals/Pain Pain Assessment: Faces Faces Pain Scale: Hurts little more Pain Location: bilat LEs Pain Descriptors / Indicators: Grimacing Pain Intervention(s): Limited activity within patient's tolerance;Monitored during session;Repositioned    Home Living Family/patient expects to be discharged to:: Private residence Living Arrangements: Spouse/significant other   Type of Home: House       Home Layout: Two level;Bed/bath upstairs   Additional Comments: Patient limited historian; will verify with family as available    Prior Function Level of Independence: Independent with assistive device(s)  Comments: Indep with ADLs, household and limited community mobilization; home O2 at 2L.     Hand Dominance   Dominant Hand: Right    Extremity/Trunk Assessment   Upper Extremity Assessment RUE Deficits / Details: Grip 3-/5. Biceps 3-/5. Triceps 3-/5.  Deltoids 2/5. PROM WFL. LUE Deficits / Details: Grip 4/5. Biceps 3+/5. Triceps  3+/5.  Deltoids 3+/5. PROM WFL.    Lower Extremity Assessment Lower Extremity Assessment: RLE deficits/detail;LLE deficits/detail RLE Deficits / Details: R hip flexion 2-/5, hip ext 3-/5, knee flex 2-/5, knee ext 3-/5, ankle DF 1/5, ankle PF 2-/5; denies sensory deficit.  Ischemic changes R great toe. LLE Deficits / Details: R hip flexion 3-/5, hip ext 3-/5, knee flex 3-/5, knee ext 3-/5, ankle DF 2-/5, ankle PF 3-/5; denies sensory deficit.  Ischemic changes L mid-foot distally       Communication   Communication: No difficulties  Cognition Arousal/Alertness: Awake/alert                                     General Comments: oriented to self only; intermittently follows simple verbal commands, often requiring increased time and repetition of command.  Very delayed processing, limited recall of new information      General Comments      Exercises Other Exercises Other Exercises: Supine bilat LE therex, act assist/passive ROM: ankle pumps, heel slides with resisted extension, hip abduct/adduct.  Significant difficulty attending to task; frequent cuing and redirection to task for attention and active effort within ability.   Assessment/Plan    PT Assessment Patient needs continued PT services  PT Problem List Decreased strength;Decreased range of motion;Decreased activity tolerance;Decreased balance;Decreased mobility;Decreased cognition;Decreased knowledge of use of DME;Decreased safety awareness;Decreased knowledge of precautions;Cardiopulmonary status limiting activity;Decreased skin integrity;Pain       PT Treatment Interventions DME instruction;Gait training;Stair training;Functional mobility training;Therapeutic activities;Therapeutic exercise;Balance training;Neuromuscular re-education;Cognitive remediation;Patient/family education    PT Goals (Current goals can be found in the Care Plan section)  Acute Rehab PT Goals Patient Stated Goal: to use the bedpan PT  Goal Formulation: With patient Time For Goal Achievement: 08/01/20 Potential to Achieve Goals: Fair    Frequency Min 2X/week   Barriers to discharge        Co-evaluation               AM-PAC PT "6 Clicks" Mobility  Outcome Measure Help needed turning from your back to your side while in a flat bed without using bedrails?: Total Help needed moving from lying on your back to sitting on the side of a flat bed without using bedrails?: Total Help needed moving to and from a bed to a chair (including a wheelchair)?: Total Help needed standing up from a chair using your arms (e.g., wheelchair or bedside chair)?: Total Help needed to walk in hospital room?: Total Help needed climbing 3-5 steps with a railing? : Total 6 Click Score: 6    End of Session   Activity Tolerance: Patient tolerated treatment well Patient left: in bed;with call bell/phone within reach Nurse Communication: Mobility status PT Visit Diagnosis: Muscle weakness (generalized) (M62.81);Hemiplegia and hemiparesis Hemiplegia - Right/Left: Right Hemiplegia - dominant/non-dominant: Dominant Hemiplegia - caused by: Cerebral infarction    Time: 1435-1452 PT Time Calculation (min) (ACUTE ONLY): 17 min   Charges:   PT Evaluation $PT Eval Moderate Complexity: 1 Mod PT Treatments $Therapeutic Exercise: 8-22 mins       Cyril Mourning  Claudie Revering, PT, DPT, NCS 07/18/20, 6:14 PM [phone]

## 2020-07-18 NOTE — Progress Notes (Signed)
[facility] [place], [place] 07/18/20  Subjective:   Hospital day # 8  Patient seen and evaluated at bedside. Stacie [name]. She is following simple commands. Creatinine currently down to 1.3 with a BUN of 51. Patient has excellent urine output at 2.7 L.  Renal: 10/01 0701 - 10/02 0700 In: 629.3 [I.V.:579.3; IV Piggyback:50] Out: 2750 [Urine:2750]   Objective:  Vital signs in last 24 hours:  Temp:  [98.3 F (36.8 C)-98.8 F (37.1 C)] 98.3 F (36.8 C) (10/02 1130) Pulse Rate:  [89-98] 94 (10/02 1230) Resp:  [18-28] 28 (10/02 1230) BP: (130-148)/(66-87) 140/66 (10/02 1230) SpO2:  [94 %-100 %] 100 % (10/02 1230)  Weight change:  Filed Weights   07/14/20 0500 07/15/20 0432 07/16/20 0500  Weight: 86.2 kg 88.7 kg 87.5 kg    Intake/Output:    Intake/Output Summary (Last 24 hours) at 07/18/2020 1328 Last data filed at 07/18/2020 0600 Gross per 24 hour  Intake --  Output 2150 ml  Net -2150 ml     Physical Exam: General:  Stacie [name], no acute distress  HEENT  Normocephalic,atraumatic, [place] O2  Pulm/lungs  mild bilateral wheezing, normal effort  CVS/Heart  S1S2 + regular  Abdomen:   Soft, nondistended  Extremities:  No peripheral edema  Neurologic:  Stacie [name], will follow simple commands  Skin:  Dusky appearance to both feet    Basic Metabolic Panel:  Recent Labs  Lab 07/14/20 0404 07/14/20 0404 07/15/20 0433 07/15/20 0433 07/15/20 1343 07/15/20 1343 07/16/20 0706 07/16/20 0706 07/16/20 0930 07/16/20 1329 07/16/20 2122 07/17/20 0314 07/18/20 0424  NA 150*   < > 156*   < > 156*   < > 156*   < > 155* 150* 154* 154* 146*  K 4.1   < > 4.5   < > 4.5  --  4.4  --  4.3  --   --  4.4 4.3  CL 110   < > 114*   < > 115*  --  115*  --  114*  --   --  109 103  CO2 24   < > 31   < > 29  --  31  --  31  --   --  31 28  GLUCOSE 188*   < > 212*   < > 202*  --  230*  --  237*  --   --  234* 194*  BUN 83*   < > 76*    < > 79*  --  76*  --  77*  --   --  60* 51*  CREATININE 2.46*   < > 2.35*   < > 2.18*  --  1.92*  --  1.85*  --   --  1.49* 1.32*  CALCIUM 7.4*   < > 7.6*   < > 7.7*   < > 7.9*   < > 8.1*  --   --  7.9* 7.9*  MG 2.5*  --  2.5*  --   --   --  2.2  --   --   --   --  1.9 1.9  PHOS 5.3*  --  5.8*  --   --   --  3.2  --   --   --   --  2.4* 2.7   < > = values in this interval not displayed.     CBC: Recent Labs  Lab 07/14/20 0404 07/15/20 1610 07/16/20 9604 07/17/20 0314 07/18/20 0424  WBC 18.6* 13.7* 19.6* 26.0* 22.9*  NEUTROABS 16.4* 11.5* 16.0* 22.2* 19.3*  HGB 12.7 13.1 13.1 13.8 13.8  HCT 40.6 42.6 42.8 45.7 45.3  MCV 96.9 99.1 100.2* 100.9* 101.1*  PLT 39* 64* 77* 76* 83*      Lab Results  Component Value Date   HEPBSAG NON REACTIVE 07/15/2020   HEPBIGM NON REACTIVE 07/15/2020      Microbiology:  Recent Results (from the past 240 hour(s))  Urine culture     Status: Abnormal   Collection Time: 07/09/20 11:27 PM   Specimen: In/Out Cath Urine  Result Value Ref Range Status   Specimen Description   Final    IN/OUT CATH URINE Performed at [facility], [address] [place], [place] [address]    Special Requests   Final    NONE Performed at [facility], [address], [place], [place] [address]    Culture >=100,000 COLONIES/mL KLEBSIELLA PNEUMONIAE (A)  Final   Report Status 07/12/2020 FINAL  Final   Organism ID, Bacteria KLEBSIELLA PNEUMONIAE (A)  Final      Susceptibility   Klebsiella pneumoniae - MIC*    AMPICILLIN RESISTANT Resistant     CEFAZOLIN <=4 SENSITIVE Sensitive     CEFTRIAXONE <=0.25 SENSITIVE Sensitive     CIPROFLOXACIN <=0.25 SENSITIVE Sensitive     GENTAMICIN <=1 SENSITIVE Sensitive     IMIPENEM <=0.25 SENSITIVE Sensitive     NITROFURANTOIN <=16 SENSITIVE Sensitive     TRIMETH/SULFA <=20 SENSITIVE Sensitive     AMPICILLIN/SULBACTAM <=2 SENSITIVE Sensitive     PIP/TAZO <=4 SENSITIVE Sensitive     * >=100,000  COLONIES/mL KLEBSIELLA PNEUMONIAE  Respiratory Panel by RT PCR (Flu A&B, Covid) - Nasopharyngeal Swab     Status: None   Collection Time: 07/09/20 11:28 PM   Specimen: Nasopharyngeal Swab  Result Value Ref Range Status   SARS Coronavirus 2 by RT PCR NEGATIVE NEGATIVE Final    Comment: (NOTE) SARS-CoV-2 target nucleic acids are NOT DETECTED.  The SARS-CoV-2 RNA is generally detectable in upper respiratoy specimens during the acute phase of infection. The lowest concentration of SARS-CoV-2 viral copies this assay can detect is 131 copies/mL. A negative result does not preclude SARS-Cov-2 infection and should not be used as the sole basis for treatment or other patient management decisions. A negative result may occur with  improper specimen collection/handling, submission of specimen other than nasopharyngeal swab, presence of viral mutation(s) within the areas targeted by this assay, and inadequate number of viral copies (<131 copies/mL). A negative result must be combined with clinical observations, patient history, and epidemiological information. The expected result is Negative.  Fact Sheet for Patients:  https://www.moore.com/  Fact Sheet for Healthcare Providers:  https://www.young.biz/  This test is no t yet approved or cleared by the [place] FDA and  has been authorized for detection and/or diagnosis of SARS-CoV-2 by FDA under an Emergency Use Authorization (EUA). This EUA will remain  in effect (meaning this test can be used) for the duration of the COVID-19 declaration under Section 564(b)(1) of the Act, 21 U.S.C. section 360bbb-3(b)(1), unless the authorization is terminated or revoked sooner.     Influenza A by PCR NEGATIVE NEGATIVE Final   Influenza B by PCR NEGATIVE NEGATIVE Final    Comment: (NOTE) The Xpert Xpress SARS-CoV-2/FLU/RSV assay is intended as an aid in  the diagnosis of influenza from Nasopharyngeal swab  specimens and  should not be used as a sole basis for treatment. Nasal washings  and  aspirates are unacceptable for Xpert Xpress SARS-CoV-2/FLU/RSV  testing.  Fact Sheet for Patients: https://www.moore.com/  Fact Sheet for Healthcare Providers: https://www.young.biz/  This test is not yet approved or cleared by the [place] FDA and  has been authorized for detection and/or diagnosis of SARS-CoV-2 by  FDA under an Emergency Use Authorization (EUA). This EUA will remain  in effect (meaning this test can be used) for the duration of the  Covid-19 declaration under Section 564(b)(1) of the Act, 21  U.S.C. section 360bbb-3(b)(1), unless the authorization is  terminated or revoked. Performed at [facility], [address] [place], [place] [address]   Blood Culture (routine x 2)     Status: Abnormal   Collection Time: 07/09/20 11:36 PM   Specimen: BLOOD  Result Value Ref Range Status   Specimen Description   Final    BLOOD RIGHT ANTECUBITAL Performed at [facility], [address] [place], [place] [address]    Special Requests   Final    BOTTLES DRAWN AEROBIC AND ANAEROBIC Blood Culture adequate volume Performed at [facility], [address] [place], [place] [address]    Culture  Setup Time   Final    Organism ID to follow GRAM NEGATIVE RODS IN BOTH AEROBIC AND ANAEROBIC BOTTLES CRITICAL RESULT CALLED TO, READ BACK BY AND VERIFIED WITH: AMY THOMPSON AT 1220 07/10/20 SDR Performed at [facility], [address] [place], [place] [address]    Culture KLEBSIELLA PNEUMONIAE (A)  Final   Report Status 07/12/2020 FINAL  Final   Organism ID, Bacteria KLEBSIELLA PNEUMONIAE  Final      Susceptibility   Klebsiella pneumoniae - MIC*    AMPICILLIN RESISTANT Resistant     CEFAZOLIN <=4 SENSITIVE Sensitive     CEFEPIME <=0.12 SENSITIVE Sensitive     CEFTAZIDIME <=1 SENSITIVE Sensitive      CEFTRIAXONE <=0.25 SENSITIVE Sensitive     CIPROFLOXACIN <=0.25 SENSITIVE Sensitive     GENTAMICIN <=1 SENSITIVE Sensitive     IMIPENEM <=0.25 SENSITIVE Sensitive     TRIMETH/SULFA <=20 SENSITIVE Sensitive     AMPICILLIN/SULBACTAM <=2 SENSITIVE Sensitive     PIP/TAZO <=4 SENSITIVE Sensitive     * KLEBSIELLA PNEUMONIAE  Blood Culture ID Panel (Reflexed)     Status: Abnormal   Collection Time: 07/09/20 11:36 PM  Result Value Ref Range Status   Enterococcus faecalis NOT DETECTED NOT DETECTED Final   Enterococcus Faecium NOT DETECTED NOT DETECTED Final   Listeria monocytogenes NOT DETECTED NOT DETECTED Final   Staphylococcus species NOT DETECTED NOT DETECTED Final   Staphylococcus aureus (BCID) NOT DETECTED NOT DETECTED Final   Staphylococcus epidermidis NOT DETECTED NOT DETECTED Final   Staphylococcus lugdunensis NOT DETECTED NOT DETECTED Final   Streptococcus species NOT DETECTED NOT DETECTED Final   Streptococcus agalactiae NOT DETECTED NOT DETECTED Final   Streptococcus pneumoniae NOT DETECTED NOT DETECTED Final   Streptococcus pyogenes NOT DETECTED NOT DETECTED Final   A.calcoaceticus-baumannii NOT DETECTED NOT DETECTED Final   Bacteroides fragilis NOT DETECTED NOT DETECTED Final   Enterobacterales DETECTED (A) NOT DETECTED Final    Comment: Enterobacterales represent a large order of gram negative bacteria, not a single organism. CRITICAL RESULT CALLED TO, READ BACK BY AND VERIFIED WITH:  AMY THOMPSON AT 1220 04/09/20 SDR    Enterobacter cloacae complex NOT DETECTED NOT DETECTED Final   Escherichia coli NOT DETECTED NOT DETECTED Final   Klebsiella aerogenes NOT DETECTED NOT DETECTED Final   Klebsiella oxytoca  NOT DETECTED NOT DETECTED Final   Klebsiella pneumoniae DETECTED (A) NOT DETECTED Final    Comment: CRITICAL RESULT CALLED TO, READ BACK BY AND VERIFIED WITH:  AMY THOMPSON AT 1220 07/10/20 SDR    Proteus species NOT DETECTED NOT DETECTED Final   Salmonella species NOT  DETECTED NOT DETECTED Final   Serratia marcescens NOT DETECTED NOT DETECTED Final   Haemophilus influenzae NOT DETECTED NOT DETECTED Final   Neisseria meningitidis NOT DETECTED NOT DETECTED Final   Pseudomonas aeruginosa NOT DETECTED NOT DETECTED Final   Stenotrophomonas maltophilia NOT DETECTED NOT DETECTED Final   Candida albicans NOT DETECTED NOT DETECTED Final   Candida auris NOT DETECTED NOT DETECTED Final   Candida glabrata NOT DETECTED NOT DETECTED Final   Candida krusei NOT DETECTED NOT DETECTED Final   Candida parapsilosis NOT DETECTED NOT DETECTED Final   Candida tropicalis NOT DETECTED NOT DETECTED Final   Cryptococcus neoformans/gattii NOT DETECTED NOT DETECTED Final   CTX-M ESBL NOT DETECTED NOT DETECTED Final   Carbapenem resistance IMP NOT DETECTED NOT DETECTED Final   Carbapenem resistance KPC NOT DETECTED NOT DETECTED Final   Carbapenem resistance NDM NOT DETECTED NOT DETECTED Final   Carbapenem resist OXA 48 LIKE NOT DETECTED NOT DETECTED Final   Carbapenem resistance VIM NOT DETECTED NOT DETECTED Final    Comment: Performed at [facility], [address] [place], [place] [address]  Blood Culture (routine x 2)     Status: Abnormal   Collection Time: 07/09/20 11:37 PM   Specimen: BLOOD  Result Value Ref Range Status   Specimen Description   Final    BLOOD BLOOD RIGHT FOREARM Performed at [facility], [address], [place], [place] [address]    Special Requests   Final    BOTTLES DRAWN AEROBIC AND ANAEROBIC Blood Culture adequate volume Performed at [facility], [address] [place], [place] [address]    Culture  Setup Time   Final    GRAM NEGATIVE RODS IN BOTH AEROBIC AND ANAEROBIC BOTTLES CRITICAL RESULT CALLED TO, READ BACK BY AND VERIFIED WITH: AMY THOMPSON AT 1220 07/10/20 SDR Performed at [facility], [address] [place], [place] [address]    Culture (A)  Final    KLEBSIELLA PNEUMONIAE SUSCEPTIBILITIES  PERFORMED ON PREVIOUS CULTURE WITHIN THE LAST 5 DAYS. Performed at [facility], [address], [place], [place] [address]    Report Status 07/12/2020 FINAL  Final  Aspergillus Ag, BAL/Serum     Status: None   Collection Time: 07/10/20  5:04 AM   Specimen: Blood  Result Value Ref Range Status   Aspergillus Ag, BAL/Serum 0.03 0.00 - 0.49 Index Final    Comment: (NOTE) Performed At: [facility] [address] [place], [place] [id number] Jolene Schimke MD DG:[id number]   MRSA PCR Screening     Status: None   Collection Time: 07/10/20 12:54 PM   Specimen: Nasal Mucosa; Nasopharyngeal  Result Value Ref Range Status   MRSA by PCR NEGATIVE NEGATIVE Final    Comment:        The GeneXpert MRSA Assay (FDA approved for NASAL specimens only), is one component of a comprehensive MRSA colonization surveillance program. It is not intended to diagnose MRSA infection nor to guide or monitor treatment for MRSA infections. Performed at [facility], [address] [place], [place] [address]   Culture, blood (Routine X 2) w Reflex to ID Panel     Status: None (Preliminary result)  Collection Time: 07/16/20  1:29 PM   Specimen: BLOOD  Result Value Ref Range Status   Specimen Description BLOOD LEFT ANTECUBITAL  Final   Special Requests   Final    AEROBIC BOTTLE ONLY Blood Culture results may not be optimal due to an inadequate volume of blood received in culture bottles   Culture   Final    NO GROWTH 2 DAYS Performed at [facility], [address], [name], [place] [address]    Report Status PENDING  Incomplete  Culture, blood (Routine X 2) w Reflex to ID Panel     Status: None (Preliminary result)   Collection Time: 07/16/20  5:09 PM   Specimen: BLOOD  Result Value Ref Range Status   Specimen Description BLOOD BLOOD RIGHT HAND  Final   Special Requests   Final    BOTTLES DRAWN AEROBIC ONLY Blood Culture results may not be optimal due to an inadequate  volume of blood received in culture bottles   Culture   Final    NO GROWTH 2 DAYS Performed at [facility], [address] [place], [place] [address]    Report Status PENDING  Incomplete    Coagulation Studies: No results for input(s): LABPROT, INR in the last 72 hours.  Urinalysis: No results for input(s): COLORURINE, LABSPEC, PHURINE, GLUCOSEU, HGBUR, BILIRUBINUR, KETONESUR, PROTEINUR, UROBILINOGEN, NITRITE, LEUKOCYTESUR in the last 72 hours.  Invalid input(s): APPERANCEUR    Imaging: EEG  Result Date: 07/16/2020 Thana Farr, MD     07/16/2020  5:23 PM ELECTROENCEPHALOGRAM REPORT Patient: Stacie [name]       Room #: IC11A EEG No. ID: [id number] Age: 65 y.o.        Sex: female Requesting Physician: Karna Christmas Report Date:  07/16/2020       Interpreting Physician: Thana Farr History: Maziyah RABIA GORMLEY is an 64 y.o. female with altered mental status and right sided weakness Medications: Brovana, ASA, Rocephin, Colace, Insulin, Solumedrol, Tyvaso Conditions of Recording:  This is a 21 channel routine scalp EEG performed with bipolar and monopolar montages arranged in accordance to the international 10/20 system of electrode placement. One channel was dedicated to EKG recording. The patient is in the awake and uncooperative state. Description:  Artifact is prominent during the recording often obscuring the background rhythm. When able to be visualized the background is slow and poorly organized.   It consists of low voltage activity in the delta-theta continuum.  For the most part this activity is continuous.  There are some rare short periods of generalized attenuation noted.  There are also some rare periodic discharges of triphasic morphology noted.  No epileptiform activity is noted.  Hyperventilation and ntermittent photic stimulation were not performed. IMPRESSION: This is an abnormal EEG secondary to general background slowing with rare triphasic waves noted.  This finding  may be seen with a diffuse cerebral disturbance that is etiologically nonspecific, but may include a metabolic encephalopathy, among other possibilities.  No epileptiform activity was noted.  Thana Farr, MD Neurology [phone] 07/16/2020, 5:18 PM   CT ABDOMEN PELVIS WO CONTRAST  Result Date: 07/17/2020 CLINICAL DATA:  Leukocytosis.  History of ureteral stents. EXAM: CT ABDOMEN AND PELVIS WITHOUT CONTRAST TECHNIQUE: Multidetector CT imaging of the abdomen and pelvis was performed following the standard protocol without IV contrast. COMPARISON:  July 14, 2020. FINDINGS: Lower chest: Stable chronic findings seen consistent with history of sarcoidosis. Hepatobiliary: No focal liver abnormality is seen. No gallstones, gallbladder wall thickening, or biliary dilatation. Pancreas:  Unremarkable. No pancreatic ductal dilatation or surrounding inflammatory changes. Spleen: Normal in size without focal abnormality. Adrenals/Urinary Tract: Adrenal glands appear normal. Small nonobstructive right renal calculi are noted. Bilateral ureteral stents are noted in grossly good position. No hydronephrosis is noted. Foley catheter is noted within urinary bladder. Stomach/Bowel: Stomach is within normal limits. Appendix appears normal. No evidence of bowel wall thickening, distention, or inflammatory changes. Vascular/Lymphatic: Aortic atherosclerosis. No enlarged abdominal or pelvic lymph nodes. Reproductive: Status post hysterectomy. No adnexal masses. Other: No abdominal wall hernia or abnormality. No abdominopelvic ascites. Musculoskeletal: No acute or significant osseous findings. IMPRESSION: 1. Small nonobstructive right renal calculi. Bilateral ureteral stents are noted in grossly good position. No hydronephrosis is noted. 2. Stable chronic findings seen in visualized lung bases consistent with history of sarcoidosis. 3. Aortic atherosclerosis. Aortic Atherosclerosis (ICD10-I70.0). Electronically Signed   By:  Lupita Raider M.D.   On: 07/17/2020 12:28   ECHOCARDIOGRAM COMPLETE BUBBLE STUDY  Result Date: 07/17/2020    ECHOCARDIOGRAM REPORT   Patient Name:   MCKYNZIE COWEN Date of Exam: 07/16/2020 Medical Rec #:  [id number]         Height:       67.0 in Accession #:    [id number]        Weight:       192.9 lb Date of Birth:  15-Sep-1956          BSA:          1.991 m Patient Age:    64 years          BP:           120/72 mmHg Patient Gender: F                 HR:           99 bpm. Exam Location:  [facility] Procedure: 2D Echo, Cardiac Doppler, Color Doppler and Saline Contrast Bubble            Study Indications:     Stroke 434.91  History:         Patient has prior history of Echocardiogram examinations, most                  recent 07/13/2020. Pulmonary HTN, Signs/Symptoms:Murmur; Risk                  Factors:Sleep Apnea.  Sonographer:     Cristela Blue RDCS (AE) Referring Phys:  [id number] LESLIE REYNOLDS Diagnosing Phys: Alwyn Pea MD IMPRESSIONS  1. No evidence of thrombus.  2. Left ventricular ejection fraction, by estimation, is 35 to 40%. The left ventricle has mild to moderately decreased function. The left ventricle demonstrates global hypokinesis. Left ventricular diastolic parameters are consistent with Grade III diastolic dysfunction (restrictive).  3. Right ventricular systolic function is normal. The right ventricular size is normal.  4. The mitral valve is normal in structure. No evidence of mitral valve regurgitation.  5. The aortic valve is grossly normal. Aortic valve regurgitation is not visualized. Conclusion(s)/Recommendation(s): No evidence of valvular vegetations on this transthoracic echocardiogram. Would recommend a transesophageal echocardiogram to exclude infective endocarditis if clinically indicated. Unable to exclude left ventricular thrombus, would recommend a repeat transthoracic echocardiogram with contrast. FINDINGS  Left Ventricle: Left ventricular ejection fraction, by estimation, is 35 to  40%. The left ventricle has mild to moderately decreased function. The left ventricle demonstrates global hypokinesis. The left ventricular internal cavity size was normal in size. There is  no left ventricular hypertrophy. Left ventricular diastolic parameters are consistent with Grade III diastolic dysfunction (restrictive). Right Ventricle: The right ventricular size is normal. No increase in right ventricular wall thickness. Right ventricular systolic function is normal. Left Atrium: Left atrial size was normal in size. Right Atrium: Right atrial size was normal in size. Pericardium: There is no evidence of pericardial effusion. Mitral Valve: The mitral valve is normal in structure. No evidence of mitral valve regurgitation. Tricuspid Valve: The tricuspid valve is normal in structure. Tricuspid valve regurgitation is not demonstrated. Aortic Valve: The aortic valve is grossly normal. Aortic valve regurgitation is not visualized. Aortic valve mean gradient measures 6.0 mmHg. Aortic valve peak gradient measures 10.6 mmHg. Aortic valve area, by VTI measures 1.72 cm. Pulmonic Valve: The pulmonic valve was normal in structure. Pulmonic valve regurgitation is not visualized. Aorta: The ascending aorta was not well visualized. IAS/Shunts: No atrial level shunt detected by color flow Doppler. Agitated saline contrast was given intravenously to evaluate for intracardiac shunting. Additional Comments: No evidence of thrombus.  LEFT VENTRICLE PLAX 2D LVIDd:         4.36 cm     Diastology LVIDs:         3.64 cm     LV e' medial:    4.90 cm/s LV PW:         1.30 cm     LV E/e' medial:  12.6 LV IVS:        1.31 cm     LV e' lateral:   3.92 cm/s LVOT diam:     2.00 cm     LV E/e' lateral: 15.7 LV SV:         52 LV SV Index:   26 LVOT Area:     3.14 cm  LV Volumes (MOD) LV vol d, MOD A2C: 73.5 ml LV vol d, MOD A4C: 73.9 ml LV vol s, MOD A2C: 43.2 ml LV vol s, MOD A4C: 47.6 ml LV SV MOD A2C:     30.3 ml LV SV MOD A4C:     73.9  ml LV SV MOD BP:      27.4 ml RIGHT VENTRICLE RV Basal diam:  2.71 cm RV S prime:     13.60 cm/s TAPSE (M-mode): 3.1 cm LEFT ATRIUM             Index       RIGHT ATRIUM           Index LA diam:        3.40 cm 1.71 cm/m  RA Area:     12.80 cm LA Vol (A2C):   34.7 ml 17.42 ml/m RA Volume:   27.80 ml  13.96 ml/m LA Vol (A4C):   28.7 ml 14.41 ml/m LA Biplane Vol: 34.0 ml 17.07 ml/m  AORTIC VALVE AV Area (Vmax):    1.99 cm AV Area (Vmean):   1.89 cm AV Area (VTI):     1.72 cm AV Vmax:           163.00 cm/s AV Vmean:          116.500 cm/s AV VTI:            0.301 m AV Peak Grad:      10.6 mmHg AV Mean Grad:      6.0 mmHg LVOT Vmax:         103.00 cm/s LVOT Vmean:        70.100 cm/s LVOT VTI:  0.165 m LVOT/AV VTI ratio: 0.55  AORTA Ao Root diam: 2.80 cm MITRAL VALVE                TRICUSPID VALVE MV Area (PHT): 4.80 cm     TR Peak grad:   23.6 mmHg MV Decel Time: 158 msec     TR Vmax:        243.00 cm/s MV E velocity: 61.60 cm/s MV A velocity: 128.00 cm/s  SHUNTS MV E/A ratio:  0.48         Systemic VTI:  0.16 m                             Systemic Diam: 2.00 cm Alwyn Pea MD Electronically signed by Alwyn Pea MD Signature Date/Time: 07/17/2020/3:51:35 PM    Final      Medications:   . cefTRIAXone (ROCEPHIN)  IV 2 g (07/17/20 1709)  . dextrose 100 mL/hr at 07/18/20 1218   . arformoterol  15 mcg Nebulization BID  . aspirin EC  81 mg Oral Daily  . budesonide (PULMICORT) nebulizer solution  0.25 mg Nebulization BID  . chlorhexidine  15 mL Mouth Rinse BID  . chlorhexidine gluconate (MEDLINE KIT)  15 mL Mouth Rinse BID  . Chlorhexidine Gluconate Cloth  6 each Topical Q0600  . docusate  100 mg Oral BID  . insulin aspart  0-15 Units Subcutaneous Q4H  . mouth rinse  15 mL Mouth Rinse 10 times per day  . methylPREDNISolone (SOLU-MEDROL) injection  20 mg Intravenous Daily  . pantoprazole (PROTONIX) IV  40 mg Intravenous QHS  . polyethylene glycol  17 g Oral Daily  . thiamine  injection  100 mg Intravenous Daily  . Treprostinil  18 mcg Inhalation QID   acetaminophen, docusate sodium, polyethylene glycol  Assessment/ Plan:  64 y.o. female with  sarcoidosis, interstitial lung disease, obstructive sleep apnea, depression, GERD, hyperlipidemia, hypothyroidism   admitted on 07/09/2020 for Elevated LFTs [R79.89] Sepsis (HCC) [A41.9] Sepsis, due to unspecified organism, unspecified whether acute organ dysfunction present [facility]) [A41.9]  # AKI Baseline Cr 1.0 in May 2021 Admi Cr 4.16, peaked at 4.57 Renal function continues to trend down. Creatinine 1.32 with urine output of 2.7 L over the preceding 24 hours. No indication for dialysis. Lab Results  Component Value Date   CREATININE 1.32 (H) 07/18/2020   CREATININE 1.49 (H) 07/17/2020   CREATININE 1.85 (H) 07/16/2020     #Sepsis from urinary source Klebsiella pneumonia and blood in urine Patient has significant history of renal stones.  History of left ureteroscopy May 2021  CT Abd/Pelvis on  07/14/20 IMPRESSION: 7-8 mm proximal right ureteral stone with obstructive change and perinephric stranding. Urology placed ureteral stents bilaterally on 07/14/20 F/C draining blood tinged urine   #History of pulmonary sarcoidosis, pulmonary hypertension Acute on chronic respiratory failure, community-acquired pneumonia  requires home oxygen and CPAP Was intubated and mechanically ventilated - now extubated Appears to be breathing comfortably at the moment.  # Hypernatremia Serum sodium now down to 146. Continue D5W for 1 additional day.    LOS: 8 Stacie [name] 10/2/20211:28 PM  [facility] [address] [place], [place] [phone]  Note: This note was prepared with Dragon dictation. Any transcription errors are unintentional

## 2020-07-18 NOTE — Progress Notes (Signed)
Subjective: Patient unchanged neurologically   Objective: Current vital signs: BP 130/79   Pulse 90   Temp 98.8 F (37.1 C) (Oral)   Resp (!) 27   Ht 5' 7.01" (1.702 m)   Wt 87.5 kg   SpO2 98%   BMI 30.21 kg/m  Vital signs in last 24 hours: Temp:  [98.5 F (36.9 C)-98.8 F (37.1 C)] 98.8 F (37.1 C) (10/02 0400) Pulse Rate:  [89-99] 90 (10/02 0300) Resp:  [18-28] 27 (10/02 0300) BP: (130-148)/(70-87) 130/79 (10/02 0300) SpO2:  [94 %-100 %] 98 % (10/02 0300)  Intake/Output from previous day: 10/01 0701 - 10/02 0700 In: 629.3 [I.V.:579.3; IV Piggyback:50] Out: 2750 [Urine:2750] Intake/Output this shift: No intake/output data recorded. Nutritional status:  Diet Order            Diet NPO time specified  Diet effective now                 Neurologic Exam: HEENT-  Normocephalic, no lesions, without obvious abnormality.  Extremities- Cyanotic toes bilaterally (left greater than right))   Neurological Examination   Mental Status: Alert.  Speech dysarthric.  Unable to follow commands.  Aphasic. Cranial Nerves: II: Pupils equal, round, reactive to light and accommodation.  Blinks to bilateral confrontation III,IV, VI: Extra-ocular motions grossly intact bilaterally V,VII: right facial droop VIII: hearing normal bilaterally IX,X: gag reflex present XI: bilateral shoulder shrug XII: unable to perform Motor: Continues to move LUE with minimal movement noted of the RUE   Lab Results: Basic Metabolic Panel: Recent Labs  Lab 07/14/20 0404 07/14/20 0404 07/15/20 0433 07/15/20 0433 07/15/20 1343 07/15/20 1343 07/16/20 0706 07/16/20 0706 07/16/20 0930 07/16/20 1329 07/16/20 2122 07/17/20 0314 07/18/20 0424  NA 150*   < > 156*   < > 156*   < > 156*   < > 155* 150* 154* 154* 146*  K 4.1   < > 4.5   < > 4.5  --  4.4  --  4.3  --   --  4.4 4.3  CL 110   < > 114*   < > 115*  --  115*  --  114*  --   --  109 103  CO2 24   < > 31   < > 29  --  31  --  31  --    --  31 28  GLUCOSE 188*   < > 212*   < > 202*  --  230*  --  237*  --   --  234* 194*  BUN 83*   < > 76*   < > 79*  --  76*  --  77*  --   --  60* 51*  CREATININE 2.46*   < > 2.35*   < > 2.18*  --  1.92*  --  1.85*  --   --  1.49* 1.32*  CALCIUM 7.4*   < > 7.6*   < > 7.7*   < > 7.9*   < > 8.1*  --   --  7.9* 7.9*  MG 2.5*  --  2.5*  --   --   --  2.2  --   --   --   --  1.9 1.9  PHOS 5.3*  --  5.8*  --   --   --  3.2  --   --   --   --  2.4* 2.7   < > = values in this interval not displayed.  Liver Function Tests: Recent Labs  Lab 07/12/20 0735 07/13/20 0316 07/14/20 0404 07/16/20 0930 07/17/20 0314  AST 190* 145* 217* 54* 60*  ALT 211* 207* 304* 157* 125*  ALKPHOS 102 93 101 71 76  BILITOT 0.7 0.6 0.8 0.9 1.0  PROT 6.1* 6.0* 6.4* 5.9* 5.9*  ALBUMIN 2.6* 2.4* 2.7* 2.6* 2.8*   No results for input(s): LIPASE, AMYLASE in the last 168 hours. Recent Labs  Lab 07/13/20 1158  AMMONIA 41*    CBC: Recent Labs  Lab 07/14/20 0404 07/15/20 0433 07/16/20 0706 07/17/20 0314 07/18/20 0424  WBC 18.6* 13.7* 19.6* 26.0* 22.9*  NEUTROABS 16.4* 11.5* 16.0* 22.2* 19.3*  HGB 12.7 13.1 13.1 13.8 13.8  HCT 40.6 42.6 42.8 45.7 45.3  MCV 96.9 99.1 100.2* 100.9* 101.1*  PLT 39* 64* 77* 76* 83*    Cardiac Enzymes: No results for input(s): CKTOTAL, CKMB, CKMBINDEX, TROPONINI in the last 168 hours.  Lipid Panel: No results for input(s): CHOL, TRIG, HDL, CHOLHDL, VLDL, LDLCALC in the last 168 hours.  CBG: Recent Labs  Lab 07/17/20 1104 07/17/20 1541 07/17/20 1952 07/17/20 2336 07/18/20 0741  GLUCAP 217* 235* 207* 223* 211*    Microbiology: Results for orders placed or performed during the hospital encounter of 07/09/20  Urine culture     Status: Abnormal   Collection Time: 07/09/20 11:27 PM   Specimen: In/Out Cath Urine  Result Value Ref Range Status   Specimen Description   Final    IN/OUT CATH URINE Performed at [facility], [address] [place],  [place] [address]    Special Requests   Final    NONE Performed at [facility], [address] [place], [place] [address]    Culture >=100,000 COLONIES/mL KLEBSIELLA PNEUMONIAE (A)  Final   Report Status 07/12/2020 FINAL  Final   Organism ID, Bacteria KLEBSIELLA PNEUMONIAE (A)  Final      Susceptibility   Klebsiella pneumoniae - MIC*    AMPICILLIN RESISTANT Resistant     CEFAZOLIN <=4 SENSITIVE Sensitive     CEFTRIAXONE <=0.25 SENSITIVE Sensitive     CIPROFLOXACIN <=0.25 SENSITIVE Sensitive     GENTAMICIN <=1 SENSITIVE Sensitive     IMIPENEM <=0.25 SENSITIVE Sensitive     NITROFURANTOIN <=16 SENSITIVE Sensitive     TRIMETH/SULFA <=20 SENSITIVE Sensitive     AMPICILLIN/SULBACTAM <=2 SENSITIVE Sensitive     PIP/TAZO <=4 SENSITIVE Sensitive     * >=100,000 COLONIES/mL KLEBSIELLA PNEUMONIAE  Respiratory Panel by RT PCR (Flu A&B, Covid) - Nasopharyngeal Swab     Status: None   Collection Time: 07/09/20 11:28 PM   Specimen: Nasopharyngeal Swab  Result Value Ref Range Status   SARS Coronavirus 2 by RT PCR NEGATIVE NEGATIVE Final    Comment: (NOTE) SARS-CoV-2 target nucleic acids are NOT DETECTED.  The SARS-CoV-2 RNA is generally detectable in upper respiratoy specimens during the acute phase of infection. The lowest concentration of SARS-CoV-2 viral copies this assay can detect is 131 copies/mL. A negative result does not preclude SARS-Cov-2 infection and should not be used as the sole basis for treatment or other patient management decisions. A negative result may occur with  improper specimen collection/handling, submission of specimen other than nasopharyngeal swab, presence of viral mutation(s) within the areas targeted by this assay, and inadequate number of viral copies (<131 copies/mL). A negative result must be combined with clinical observations, patient history, and epidemiological information. The expected result is Negative.  Fact Sheet for Patients:   PinkCheek.be  Fact Sheet for Healthcare Providers:  GravelBags.it  This test is no t yet approved or cleared by the [place] FDA and  has been authorized for detection and/or diagnosis of SARS-CoV-2 by FDA under an Emergency Use Authorization (EUA). This EUA will remain  in effect (meaning this test can be used) for the duration of the COVID-19 declaration under Section 564(b)(1) of the Act, 21 U.S.C. section 360bbb-3(b)(1), unless the authorization is terminated or revoked sooner.     Influenza A by PCR NEGATIVE NEGATIVE Final   Influenza B by PCR NEGATIVE NEGATIVE Final    Comment: (NOTE) The Xpert Xpress SARS-CoV-2/FLU/RSV assay is intended as an aid in  the diagnosis of influenza from Nasopharyngeal swab specimens and  should not be used as a sole basis for treatment. Nasal washings and  aspirates are unacceptable for Xpert Xpress SARS-CoV-2/FLU/RSV  testing.  Fact Sheet for Patients: PinkCheek.be  Fact Sheet for Healthcare Providers: GravelBags.it  This test is not yet approved or cleared by the [place] FDA and  has been authorized for detection and/or diagnosis of SARS-CoV-2 by  FDA under an Emergency Use Authorization (EUA). This EUA will remain  in effect (meaning this test can be used) for the duration of the  Covid-19 declaration under Section 564(b)(1) of the Act, 21  U.S.C. section 360bbb-3(b)(1), unless the authorization is  terminated or revoked. Performed at [facility], [address] [place], [place] [address]   Blood Culture (routine x 2)     Status: Abnormal   Collection Time: 07/09/20 11:36 PM   Specimen: BLOOD  Result Value Ref Range Status   Specimen Description   Final    BLOOD RIGHT ANTECUBITAL Performed at [facility], [address] [place], [place] [address]    Special Requests   Final     BOTTLES DRAWN AEROBIC AND ANAEROBIC Blood Culture adequate volume Performed at [facility], [address] [place], [place] [address]    Culture  Setup Time   Final    Organism ID to follow GRAM NEGATIVE RODS IN BOTH AEROBIC AND ANAEROBIC BOTTLES CRITICAL RESULT CALLED TO, READ BACK BY AND VERIFIED WITH: AMY THOMPSON AT 1220 07/10/20 SDR Performed at [facility], [address] [place], [place] [address]    Culture KLEBSIELLA PNEUMONIAE (A)  Final   Report Status 07/12/2020 FINAL  Final   Organism ID, Bacteria KLEBSIELLA PNEUMONIAE  Final      Susceptibility   Klebsiella pneumoniae - MIC*    AMPICILLIN RESISTANT Resistant     CEFAZOLIN <=4 SENSITIVE Sensitive     CEFEPIME <=0.12 SENSITIVE Sensitive     CEFTAZIDIME <=1 SENSITIVE Sensitive     CEFTRIAXONE <=0.25 SENSITIVE Sensitive     CIPROFLOXACIN <=0.25 SENSITIVE Sensitive     GENTAMICIN <=1 SENSITIVE Sensitive     IMIPENEM <=0.25 SENSITIVE Sensitive     TRIMETH/SULFA <=20 SENSITIVE Sensitive     AMPICILLIN/SULBACTAM <=2 SENSITIVE Sensitive     PIP/TAZO <=4 SENSITIVE Sensitive     * KLEBSIELLA PNEUMONIAE  Blood Culture ID Panel (Reflexed)     Status: Abnormal   Collection Time: 07/09/20 11:36 PM  Result Value Ref Range Status   Enterococcus faecalis NOT DETECTED NOT DETECTED Final   Enterococcus Faecium NOT DETECTED NOT DETECTED Final   Listeria monocytogenes NOT DETECTED NOT DETECTED Final   Staphylococcus species NOT DETECTED NOT DETECTED Final   Staphylococcus aureus (BCID) NOT DETECTED NOT DETECTED Final   Staphylococcus epidermidis NOT DETECTED NOT DETECTED  Final   Staphylococcus lugdunensis NOT DETECTED NOT DETECTED Final   Streptococcus species NOT DETECTED NOT DETECTED Final   Streptococcus agalactiae NOT DETECTED NOT DETECTED Final   Streptococcus pneumoniae NOT DETECTED NOT DETECTED Final   Streptococcus pyogenes NOT DETECTED NOT DETECTED Final   A.calcoaceticus-baumannii NOT DETECTED NOT  DETECTED Final   Bacteroides fragilis NOT DETECTED NOT DETECTED Final   Enterobacterales DETECTED (A) NOT DETECTED Final    Comment: Enterobacterales represent a large order of gram negative bacteria, not a single organism. CRITICAL RESULT CALLED TO, READ BACK BY AND VERIFIED WITH:  AMY THOMPSON AT 3244 04/09/20 SDR    Enterobacter cloacae complex NOT DETECTED NOT DETECTED Final   Escherichia coli NOT DETECTED NOT DETECTED Final   Klebsiella aerogenes NOT DETECTED NOT DETECTED Final   Klebsiella oxytoca NOT DETECTED NOT DETECTED Final   Klebsiella pneumoniae DETECTED (A) NOT DETECTED Final    Comment: CRITICAL RESULT CALLED TO, READ BACK BY AND VERIFIED WITH:  AMY THOMPSON AT 1220 07/10/20 SDR    Proteus species NOT DETECTED NOT DETECTED Final   Salmonella species NOT DETECTED NOT DETECTED Final   Serratia marcescens NOT DETECTED NOT DETECTED Final   Haemophilus influenzae NOT DETECTED NOT DETECTED Final   Neisseria meningitidis NOT DETECTED NOT DETECTED Final   Pseudomonas aeruginosa NOT DETECTED NOT DETECTED Final   Stenotrophomonas maltophilia NOT DETECTED NOT DETECTED Final   Candida albicans NOT DETECTED NOT DETECTED Final   Candida auris NOT DETECTED NOT DETECTED Final   Candida glabrata NOT DETECTED NOT DETECTED Final   Candida krusei NOT DETECTED NOT DETECTED Final   Candida parapsilosis NOT DETECTED NOT DETECTED Final   Candida tropicalis NOT DETECTED NOT DETECTED Final   Cryptococcus neoformans/gattii NOT DETECTED NOT DETECTED Final   CTX-M ESBL NOT DETECTED NOT DETECTED Final   Carbapenem resistance IMP NOT DETECTED NOT DETECTED Final   Carbapenem resistance KPC NOT DETECTED NOT DETECTED Final   Carbapenem resistance NDM NOT DETECTED NOT DETECTED Final   Carbapenem resist OXA 48 LIKE NOT DETECTED NOT DETECTED Final   Carbapenem resistance VIM NOT DETECTED NOT DETECTED Final    Comment: Performed at [facility], [address] [place], [place] [address]  Blood  Culture (routine x 2)     Status: Abnormal   Collection Time: 07/09/20 11:37 PM   Specimen: BLOOD  Result Value Ref Range Status   Specimen Description   Final    BLOOD BLOOD RIGHT FOREARM Performed at [facility], [address] [place], [place] [address]    Special Requests   Final    BOTTLES DRAWN AEROBIC AND ANAEROBIC Blood Culture adequate volume Performed at [facility], [address] [place], [place] [address]    Culture  Setup Time   Final    GRAM NEGATIVE RODS IN BOTH AEROBIC AND ANAEROBIC BOTTLES CRITICAL RESULT CALLED TO, READ BACK BY AND VERIFIED WITH: AMY THOMPSON AT 1220 07/10/20 SDR Performed at [facility], [address] [place], [place] [address]    Culture (A)  Final    KLEBSIELLA PNEUMONIAE SUSCEPTIBILITIES PERFORMED ON PREVIOUS CULTURE WITHIN THE LAST 5 DAYS. Performed at [facility], [place] [address] [place],  [address]    Report Status 07/12/2020 FINAL  Final  Aspergillus Ag, BAL/Serum     Status: None   Collection Time: 07/10/20  5:04 AM   Specimen: Blood  Result Value Ref Range Status   Aspergillus Ag, BAL/Serum 0.03 0.00 - 0.49 Index Final  Comment: (NOTE) Performed At: [facility] [place], [place] [id number] Rush Farmer MD [id number]   MRSA PCR Screening     Status: None   Collection Time: 07/10/20 12:54 PM   Specimen: Nasal Mucosa; Nasopharyngeal  Result Value Ref Range Status   MRSA by PCR NEGATIVE NEGATIVE Final    Comment:        The GeneXpert MRSA Assay (FDA approved for NASAL specimens only), is one component of a comprehensive MRSA colonization surveillance program. It is not intended to diagnose MRSA infection nor to guide or monitor treatment for MRSA infections. Performed at [facility], [address] [place], [place] [address]   Culture, blood (Routine X 2) w Reflex to ID Panel     Status: None (Preliminary result)   Collection Time:  07/16/20  1:29 PM   Specimen: BLOOD  Result Value Ref Range Status   Specimen Description BLOOD LEFT ANTECUBITAL  Final   Special Requests   Final    AEROBIC BOTTLE ONLY Blood Culture results may not be optimal due to an inadequate volume of blood received in culture bottles   Culture   Final    NO GROWTH 2 DAYS Performed at [facility], [address], [place], [place] [address]    Report Status PENDING  Incomplete  Culture, blood (Routine X 2) w Reflex to ID Panel     Status: None (Preliminary result)   Collection Time: 07/16/20  5:09 PM   Specimen: BLOOD  Result Value Ref Range Status   Specimen Description BLOOD BLOOD RIGHT HAND  Final   Special Requests   Final    BOTTLES DRAWN AEROBIC ONLY Blood Culture results may not be optimal due to an inadequate volume of blood received in culture bottles   Culture   Final    NO GROWTH 2 DAYS Performed at [facility], [address] [place],  [address]    Report Status PENDING  Incomplete    Coagulation Studies: No results for input(s): LABPROT, INR in the last 72 hours.  Imaging: EEG  Result Date: 07/16/2020 [name], MD     07/16/2020  5:23 PM ELECTROENCEPHALOGRAM REPORT Patient: Stacie [name]       Room #: IC11A EEG No. ID: [id number] Age: 64 y.o.        Sex: female Requesting Physician: Lanney Gins Report Date:  07/16/2020       Interpreting Physician: [name] History: Ayano SAPHIRE BARNHART is an 64 y.o. female with altered mental status and right sided weakness Medications: Brovana, ASA, Rocephin, Colace, Insulin, Solumedrol, Tyvaso Conditions of Recording:  This is a 21 channel routine scalp EEG performed with bipolar and monopolar montages arranged in accordance to the international 10/20 system of electrode placement. One channel was dedicated to EKG recording. The patient is in the awake and uncooperative state. Description:  Artifact is prominent during the recording often obscuring the  background rhythm. When able to be visualized the background is slow and poorly organized.   It consists of low voltage activity in the delta-theta continuum.  For the most [name] this activity is continuous.  There are some rare short periods of generalized attenuation noted.  There are also some rare periodic discharges of triphasic morphology noted.  No epileptiform activity is noted.  Hyperventilation and ntermittent photic stimulation were not performed. IMPRESSION: This is an abnormal EEG secondary to general background slowing with rare triphasic waves noted.  This finding may be seen with a  diffuse cerebral disturbance that is etiologically nonspecific, but may include a metabolic encephalopathy, among other possibilities.  No epileptiform activity was noted.  [name], MD Neurology [phone] 07/16/2020, 5:18 PM   CT ABDOMEN PELVIS WO CONTRAST  Result Date: 07/17/2020 CLINICAL DATA:  Leukocytosis.  History of ureteral stents. EXAM: CT ABDOMEN AND PELVIS WITHOUT CONTRAST TECHNIQUE: Multidetector CT imaging of the abdomen and pelvis was performed following the standard protocol without IV contrast. COMPARISON:  July 14, 2020. FINDINGS: Lower chest: Stable chronic findings seen consistent with history of sarcoidosis. Hepatobiliary: No focal liver abnormality is seen. No gallstones, gallbladder wall thickening, or biliary dilatation. Pancreas: Unremarkable. No pancreatic ductal dilatation or surrounding inflammatory changes. Spleen: Normal in size without focal abnormality. Adrenals/Urinary Tract: Adrenal glands appear normal. Small nonobstructive right renal calculi are noted. Bilateral ureteral stents are noted in grossly good position. No hydronephrosis is noted. Foley catheter is noted within urinary bladder. Stomach/Bowel: Stomach is within normal limits. Appendix appears normal. No evidence of bowel wall thickening, distention, or inflammatory changes. Vascular/Lymphatic: Aortic  atherosclerosis. No enlarged abdominal or pelvic lymph nodes. Reproductive: Status post hysterectomy. No adnexal masses. Other: No abdominal wall hernia or abnormality. No abdominopelvic ascites. Musculoskeletal: No acute or significant osseous findings. IMPRESSION: 1. Small nonobstructive right renal calculi. Bilateral ureteral stents are noted in grossly good position. No hydronephrosis is noted. 2. Stable chronic findings seen in visualized lung bases consistent with history of sarcoidosis. 3. Aortic atherosclerosis. Aortic Atherosclerosis (ICD10-I70.0). Electronically Signed   By: Marijo Conception M.D.   On: 07/17/2020 12:28   CT ANGIO HEAD W OR WO CONTRAST  Result Date: 07/16/2020 CLINICAL DATA:  Stroke suspected.  Unable to move right side. EXAM: CT ANGIOGRAPHY HEAD AND NECK CT PERFUSION BRAIN TECHNIQUE: Multidetector CT imaging of the head and neck was performed using the standard protocol during bolus administration of intravenous contrast. Multiplanar CT image reconstructions and MIPs were obtained to evaluate the vascular anatomy. Carotid stenosis measurements (when applicable) are obtained utilizing NASCET criteria, using the distal internal carotid diameter as the denominator. Multiphase CT imaging of the brain was performed following IV bolus contrast injection. Subsequent parametric perfusion maps were calculated using RAPID software. CONTRAST:  166m OMNIPAQUE IOHEXOL 350 MG/ML SOLN COMPARISON:  CT head 07/10/2020 FINDINGS: CT HEAD FINDINGS Brain: Evaluation is limited by patient motion. There is no definite evidence of acute large vascular territory infarct. There is similar extensive patchy white matter hypoattenuation, most likely the sequela of chronic microvascular ischemic disease. No acute hemorrhage. No abnormal mass effect or mass lesion. No hydrocephalus. Vascular: No hyperdense vessel identified. Calcific atherosclerosis. Skull: No evidence of acute fracture. Sinuses/Orbits: Limit  evaluation without evidence of fracture or focal lesion. Other: None. ASPECTS ([facility]) Total score (0-10 with 10 being normal): 10 Review of the MIP images confirms the above findings CTA NECK FINDINGS Nondiagnostic arterial evaluation in the upper neck/skull base, including the internal carotid arteries in the neck and mid to distal vertebral arteries. Aortic arch: Calcified atherosclerosis without evidence of significant stenosis or aneurysm. Right carotid system: The visualized common carotid artery is within normal limits without significant stenosis or occlusion. Nondiagnostic evaluation of the distal common carotid artery and internal carotid artery to the level of the petrous carotid. Left carotid system: Calcified and noncalcified atherosclerosis of the common carotid artery without evidence of significant stenosis. Nondiagnostic evaluation of the distal common carotid artery and internal carotid artery to the level of the petrous carotid. Vertebral arteries: Approximately,  no significant stenosis or occlusion. Nondiagnostic evaluation of the mid to distal vertebral arteries. Skeleton: Multilevel severe degenerative change. Other neck: No evidence of mass or adenopathy. Upper chest: Partially imaged fibrotic lung changes, possibly related to the patient's history of sarcoidosis. Review of the MIP images confirms the above findings CTA HEAD FINDINGS Anterior circulation: Calcific atherosclerosis of bilateral cavernous carotids without evidence of greater than 50% narrowing. Bilateral M1 MCAs and proximal M2 MCA is are patent without evidence of significant stenosis. Distal MCA branches appear relatively symmetric. The right A1 ACA is hypoplastic with a large left A1 ACA, likely anatomic variation. Posterior circulation: No significant stenosis or occlusion of the distal V4 vertebral arteries and basilar artery. Fetal type right PCA. No significant stenosis identified of the  posterior cerebral arteries. No aneurysm. Venous sinuses: As permitted by contrast timing, patent.ww Review of the MIP images confirms the above findings CT Brain Perfusion Findings: ASPECTS: 10 CBF (<30%) Volume: 68m Perfusion (Tmax>6.0s) volume: 678mMismatch Volume: 103m25malculated T-max greater than 6 sec and mismatch is favored artifactual given location in a region of streak/motion and non vascular territory. IMPRESSION: 1. Overall limited evaluation given patient motion with nondiagnostic arterial evaluation in the upper neck/skull base, including the internal carotid arteries and mid to distal vertebral arteries in the neck. Within this limitation, no evidence of acute intracranial abnormality, large vessel occlusion or significant (greater than 50%) arterial stenosis. Repeat CTA neck could further evaluate if clinically indicated. 2. No convincing penumbra. Small area of reported mismatch is favored artifactual. 3. Partially imaged fibrotic lung changes, possibly related to the patient's reported history of sarcoidosis. Code stroke imaging results were communicated on 07/16/2020 at 11:58 am to provider Dr. ReyDoy Mincea telephone, who verbally acknowledged these results. Electronically Signed   By: FreMargaretha Sheffield   On: 07/16/2020 12:17   CT ANGIO NECK W OR WO CONTRAST  Result Date: 07/16/2020 CLINICAL DATA:  Stroke suspected.  Unable to move right side. EXAM: CT ANGIOGRAPHY HEAD AND NECK CT PERFUSION BRAIN TECHNIQUE: Multidetector CT imaging of the head and neck was performed using the standard protocol during bolus administration of intravenous contrast. Multiplanar CT image reconstructions and MIPs were obtained to evaluate the vascular anatomy. Carotid stenosis measurements (when applicable) are obtained utilizing NASCET criteria, using the distal internal carotid diameter as the denominator. Multiphase CT imaging of the brain was performed following IV bolus contrast injection. Subsequent  parametric perfusion maps were calculated using RAPID software. CONTRAST:  100m59mNIPAQUE IOHEXOL 350 MG/ML SOLN COMPARISON:  CT head 07/10/2020 FINDINGS: CT HEAD FINDINGS Brain: Evaluation is limited by patient motion. There is no definite evidence of acute large vascular territory infarct. There is similar extensive patchy white matter hypoattenuation, most likely the sequela of chronic microvascular ischemic disease. No acute hemorrhage. No abnormal mass effect or mass lesion. No hydrocephalus. Vascular: No hyperdense vessel identified. Calcific atherosclerosis. Skull: No evidence of acute fracture. Sinuses/Orbits: Limit evaluation without evidence of fracture or focal lesion. Other: None. ASPECTS ([facility] Early CT Score) Total score (0-10 with 10 being normal): 10 Review of the MIP images confirms the above findings CTA NECK FINDINGS Nondiagnostic arterial evaluation in the upper neck/skull base, including the internal carotid arteries in the neck and mid to distal vertebral arteries. Aortic arch: Calcified atherosclerosis without evidence of significant stenosis or aneurysm. Right carotid system: The visualized common carotid artery is within normal limits without significant stenosis or occlusion. Nondiagnostic evaluation of the distal common carotid artery and internal  carotid artery to the level of the petrous carotid. Left carotid system: Calcified and noncalcified atherosclerosis of the common carotid artery without evidence of significant stenosis. Nondiagnostic evaluation of the distal common carotid artery and internal carotid artery to the level of the petrous carotid. Vertebral arteries: Approximately, no significant stenosis or occlusion. Nondiagnostic evaluation of the mid to distal vertebral arteries. Skeleton: Multilevel severe degenerative change. Other neck: No evidence of mass or adenopathy. Upper chest: Partially imaged fibrotic lung changes, possibly related to the patient's  history of sarcoidosis. Review of the MIP images confirms the above findings CTA HEAD FINDINGS Anterior circulation: Calcific atherosclerosis of bilateral cavernous carotids without evidence of greater than 50% narrowing. Bilateral M1 MCAs and proximal M2 MCA is are patent without evidence of significant stenosis. Distal MCA branches appear relatively symmetric. The right A1 ACA is hypoplastic with a large left A1 ACA, likely anatomic variation. Posterior circulation: No significant stenosis or occlusion of the distal V4 vertebral arteries and basilar artery. Fetal type right PCA. No significant stenosis identified of the posterior cerebral arteries. No aneurysm. Venous sinuses: As permitted by contrast timing, patent.ww Review of the MIP images confirms the above findings CT Brain Perfusion Findings: ASPECTS: 10 CBF (<30%) Volume: 51m Perfusion (Tmax>6.0s) volume: 663mMismatch Volume: 65m51malculated T-max greater than 6 sec and mismatch is favored artifactual given location in a region of streak/motion and non vascular territory. IMPRESSION: 1. Overall limited evaluation given patient motion with nondiagnostic arterial evaluation in the upper neck/skull base, including the internal carotid arteries and mid to distal vertebral arteries in the neck. Within this limitation, no evidence of acute intracranial abnormality, large vessel occlusion or significant (greater than 50%) arterial stenosis. Repeat CTA neck could further evaluate if clinically indicated. 2. No convincing penumbra. Small area of reported mismatch is favored artifactual. 3. Partially imaged fibrotic lung changes, possibly related to the patient's reported history of sarcoidosis. Code stroke imaging results were communicated on 07/16/2020 at 11:58 am to provider Dr. ReyDoy Mincea telephone, who verbally acknowledged these results. Electronically Signed   By: FreMargaretha Sheffield   On: 07/16/2020 12:17   MR BRAIN W WO CONTRAST  Result Date:  07/16/2020 CLINICAL DATA:  Neuro deficit, acute, stroke suspected. EXAM: MRI HEAD WITHOUT AND WITH CONTRAST TECHNIQUE: Multiplanar, multiecho pulse sequences of the brain and surrounding structures were obtained without and with intravenous contrast. CONTRAST:  8mL25mDAVIST GADOBUTROL 1 MMOL/ML IV SOLN COMPARISON:  Same day CT imaging FINDINGS: Brain: Severely limited evaluation with multiple nondiagnostic sequences. Diffusion-weighted imaging is significantly limited with multiple small areas of DWI hyperintensity in the white matter. Patchy bilateral white matter T2/FLAIR hyperintensities, likely the sequela of chronic microvascular ischemic disease. Mild diffuse cerebral volume loss with ex vacuo ventricular dilation. No hydrocephalus. No large hemorrhage, mass lesion, or significant mass effect. Vascular: Better evaluated on same-day CT code stroke. Flow voids are grossly maintained at the skull base. Skull and upper cervical spine: Not well evaluated Sinuses/Orbits: No substantial paranasal sinus disease. No acute orbital abnormality. Other: Small left mastoid effusion. IMPRESSION: Severely limited evaluation with multiple nondiagnostic sequences. Diffusion-weighted imaging is significantly limited with multiple small areas of DWI hyperintensity in the bilateral white matter representing artifact versus small infarcts. Repeat MRI (possibly with sedation) could further evaluate if clinically indicated. Electronically Signed   By: FredMargaretha Sheffield  On: 07/16/2020 12:46   CT CEREBRAL PERFUSION W CONTRAST  Result Date: 07/16/2020 CLINICAL DATA:  Stroke suspected.  Unable to move right side.  EXAM: CT ANGIOGRAPHY HEAD AND NECK CT PERFUSION BRAIN TECHNIQUE: Multidetector CT imaging of the head and neck was performed using the standard protocol during bolus administration of intravenous contrast. Multiplanar CT image reconstructions and MIPs were obtained to evaluate the vascular anatomy. Carotid stenosis  measurements (when applicable) are obtained utilizing NASCET criteria, using the distal internal carotid diameter as the denominator. Multiphase CT imaging of the brain was performed following IV bolus contrast injection. Subsequent parametric perfusion maps were calculated using RAPID software. CONTRAST:  152m OMNIPAQUE IOHEXOL 350 MG/ML SOLN COMPARISON:  CT head 07/10/2020 FINDINGS: CT HEAD FINDINGS Brain: Evaluation is limited by patient motion. There is no definite evidence of acute large vascular territory infarct. There is similar extensive patchy white matter hypoattenuation, most likely the sequela of chronic microvascular ischemic disease. No acute hemorrhage. No abnormal mass effect or mass lesion. No hydrocephalus. Vascular: No hyperdense vessel identified. Calcific atherosclerosis. Skull: No evidence of acute fracture. Sinuses/Orbits: Limit evaluation without evidence of fracture or focal lesion. Other: None. ASPECTS ([facility]) Total score (0-10 with 10 being normal): 10 Review of the MIP images confirms the above findings CTA NECK FINDINGS Nondiagnostic arterial evaluation in the upper neck/skull base, including the internal carotid arteries in the neck and mid to distal vertebral arteries. Aortic arch: Calcified atherosclerosis without evidence of significant stenosis or aneurysm. Right carotid system: The visualized common carotid artery is within normal limits without significant stenosis or occlusion. Nondiagnostic evaluation of the distal common carotid artery and internal carotid artery to the level of the petrous carotid. Left carotid system: Calcified and noncalcified atherosclerosis of the common carotid artery without evidence of significant stenosis. Nondiagnostic evaluation of the distal common carotid artery and internal carotid artery to the level of the petrous carotid. Vertebral arteries: Approximately, no significant stenosis or occlusion. Nondiagnostic  evaluation of the mid to distal vertebral arteries. Skeleton: Multilevel severe degenerative change. Other neck: No evidence of mass or adenopathy. Upper chest: Partially imaged fibrotic lung changes, possibly related to the patient's history of sarcoidosis. Review of the MIP images confirms the above findings CTA HEAD FINDINGS Anterior circulation: Calcific atherosclerosis of bilateral cavernous carotids without evidence of greater than 50% narrowing. Bilateral M1 MCAs and proximal M2 MCA is are patent without evidence of significant stenosis. Distal MCA branches appear relatively symmetric. The right A1 ACA is hypoplastic with a large left A1 ACA, likely anatomic variation. Posterior circulation: No significant stenosis or occlusion of the distal V4 vertebral arteries and basilar artery. Fetal type right PCA. No significant stenosis identified of the posterior cerebral arteries. No aneurysm. Venous sinuses: As permitted by contrast timing, patent.ww Review of the MIP images confirms the above findings CT Brain Perfusion Findings: ASPECTS: 10 CBF (<30%) Volume: 047mPerfusion (Tmax>6.0s) volume: 64m58mismatch Volume: 64mL50mlculated T-max greater than 6 sec and mismatch is favored artifactual given location in a region of streak/motion and non vascular territory. IMPRESSION: 1. Overall limited evaluation given patient motion with nondiagnostic arterial evaluation in the upper neck/skull base, including the internal carotid arteries and mid to distal vertebral arteries in the neck. Within this limitation, no evidence of acute intracranial abnormality, large vessel occlusion or significant (greater than 50%) arterial stenosis. Repeat CTA neck could further evaluate if clinically indicated. 2. No convincing penumbra. Small area of reported mismatch is favored artifactual. 3. Partially imaged fibrotic lung changes, possibly related to the patient's reported history of sarcoidosis. Code stroke imaging results were  communicated on 07/16/2020 at 11:58 am  to provider Dr. Doy Mince via telephone, who verbally acknowledged these results. Electronically Signed   By: Margaretha Sheffield MD   On: 07/16/2020 12:17   ECHOCARDIOGRAM COMPLETE BUBBLE STUDY  Result Date: 07/17/2020    ECHOCARDIOGRAM REPORT   Patient Name:   Stacie [name] Date of Exam: 07/16/2020 Medical Rec #:  [id number]         Height:       67.0 in Accession #:    [id number]        Weight:       192.9 lb Date of Birth:  05/27/1956          BSA:          1.991 m Patient Age:    21 years          BP:           120/72 mmHg Patient Gender: F                 HR:           99 bpm. Exam Location:  [facility] Procedure: 2D Echo, Cardiac Doppler, Color Doppler and Saline Contrast Bubble            Study Indications:     Stroke 434.91  History:         Patient has prior history of Echocardiogram examinations, most                  recent 07/13/2020. Pulmonary HTN, Signs/Symptoms:Murmur; Risk                  Factors:Sleep Apnea.  Sonographer:     Sherrie Sport RDCS (AE) Referring Phys:  [place] Diagnosing Phys: Yolonda Kida MD IMPRESSIONS  1. No evidence of thrombus.  2. Left ventricular ejection fraction, by estimation, is 35 to 40%. The left ventricle has mild to moderately decreased function. The left ventricle demonstrates global hypokinesis. Left ventricular diastolic parameters are consistent with Grade III diastolic dysfunction (restrictive).  3. Right ventricular systolic function is normal. The right ventricular size is normal.  4. The mitral valve is normal in structure. No evidence of mitral valve regurgitation.  5. The aortic valve is grossly normal. Aortic valve regurgitation is not visualized. Conclusion(s)/Recommendation(s): No evidence of valvular vegetations on this transthoracic echocardiogram. Would recommend a transesophageal echocardiogram to exclude infective endocarditis if clinically indicated. Unable to exclude left ventricular thrombus, would  recommend a repeat transthoracic echocardiogram with contrast. FINDINGS  Left Ventricle: Left ventricular ejection fraction, by estimation, is 35 to 40%. The left ventricle has mild to moderately decreased function. The left ventricle demonstrates global hypokinesis. The left ventricular internal cavity size was normal in size. There is no left ventricular hypertrophy. Left ventricular diastolic parameters are consistent with Grade III diastolic dysfunction (restrictive). Right Ventricle: The right ventricular size is normal. No increase in right ventricular wall thickness. Right ventricular systolic function is normal. Left Atrium: Left atrial size was normal in size. Right Atrium: Right atrial size was normal in size. Pericardium: There is no evidence of pericardial effusion. Mitral Valve: The mitral valve is normal in structure. No evidence of mitral valve regurgitation. Tricuspid Valve: The tricuspid valve is normal in structure. Tricuspid valve regurgitation is not demonstrated. Aortic Valve: The aortic valve is grossly normal. Aortic valve regurgitation is not visualized. Aortic valve mean gradient measures 6.0 mmHg. Aortic valve peak gradient measures 10.6 mmHg. Aortic valve area, by VTI measures 1.72 cm. Pulmonic Valve: The pulmonic  valve was normal in structure. Pulmonic valve regurgitation is not visualized. Aorta: The ascending aorta was not well visualized. IAS/Shunts: No atrial level shunt detected by color flow Doppler. Agitated saline contrast was given intravenously to evaluate for intracardiac shunting. Additional Comments: No evidence of thrombus.  LEFT VENTRICLE PLAX 2D LVIDd:         4.36 cm     Diastology LVIDs:         3.64 cm     LV e' medial:    4.90 cm/s LV PW:         1.30 cm     LV E/e' medial:  12.6 LV IVS:        1.31 cm     LV e' lateral:   3.92 cm/s LVOT diam:     2.00 cm     LV E/e' lateral: 15.7 LV SV:         52 LV SV Index:   26 LVOT Area:     3.14 cm  LV Volumes (MOD) LV vol d,  MOD A2C: 73.5 ml LV vol d, MOD A4C: 73.9 ml LV vol s, MOD A2C: 43.2 ml LV vol s, MOD A4C: 47.6 ml LV SV MOD A2C:     30.3 ml LV SV MOD A4C:     73.9 ml LV SV MOD BP:      27.4 ml RIGHT VENTRICLE RV Basal diam:  2.71 cm RV S prime:     13.60 cm/s TAPSE (M-mode): 3.1 cm LEFT ATRIUM             Index       RIGHT ATRIUM           Index LA diam:        3.40 cm 1.71 cm/m  RA Area:     12.80 cm LA Vol (A2C):   34.7 ml 17.42 ml/m RA Volume:   27.80 ml  13.96 ml/m LA Vol (A4C):   28.7 ml 14.41 ml/m LA Biplane Vol: 34.0 ml 17.07 ml/m  AORTIC VALVE AV Area (Vmax):    1.99 cm AV Area (Vmean):   1.89 cm AV Area (VTI):     1.72 cm AV Vmax:           163.00 cm/s AV Vmean:          116.500 cm/s AV VTI:            0.301 m AV Peak Grad:      10.6 mmHg AV Mean Grad:      6.0 mmHg LVOT Vmax:         103.00 cm/s LVOT Vmean:        70.100 cm/s LVOT VTI:          0.165 m LVOT/AV VTI ratio: 0.55  AORTA Ao Root diam: 2.80 cm MITRAL VALVE                TRICUSPID VALVE MV Area (PHT): 4.80 cm     TR Peak grad:   23.6 mmHg MV Decel Time: 158 msec     TR Vmax:        243.00 cm/s MV E velocity: 61.60 cm/s MV A velocity: 128.00 cm/s  SHUNTS MV E/A ratio:  0.48         Systemic VTI:  0.16 m                             Systemic Diam:  2.00 cm Yolonda Kida MD Electronically signed by Yolonda Kida MD Signature Date/Time: 07/17/2020/3:51:35 PM    Final    CT HEAD CODE STROKE WO CONTRAST  Result Date: 07/16/2020 CLINICAL DATA:  Stroke suspected.  Unable to move right side. EXAM: CT ANGIOGRAPHY HEAD AND NECK CT PERFUSION BRAIN TECHNIQUE: Multidetector CT imaging of the head and neck was performed using the standard protocol during bolus administration of intravenous contrast. Multiplanar CT image reconstructions and MIPs were obtained to evaluate the vascular anatomy. Carotid stenosis measurements (when applicable) are obtained utilizing NASCET criteria, using the distal internal carotid diameter as the denominator. Multiphase CT  imaging of the brain was performed following IV bolus contrast injection. Subsequent parametric perfusion maps were calculated using RAPID software. CONTRAST:  111m OMNIPAQUE IOHEXOL 350 MG/ML SOLN COMPARISON:  CT head 07/10/2020 FINDINGS: CT HEAD FINDINGS Brain: Evaluation is limited by patient motion. There is no definite evidence of acute large vascular territory infarct. There is similar extensive patchy white matter hypoattenuation, most likely the sequela of chronic microvascular ischemic disease. No acute hemorrhage. No abnormal mass effect or mass lesion. No hydrocephalus. Vascular: No hyperdense vessel identified. Calcific atherosclerosis. Skull: No evidence of acute fracture. Sinuses/Orbits: Limit evaluation without evidence of fracture or focal lesion. Other: None. ASPECTS ([facility] Score) Total score (0-10 with 10 being normal): 10 Review of the MIP images confirms the above findings CTA NECK FINDINGS Nondiagnostic arterial evaluation in the upper neck/skull base, including the internal carotid arteries in the neck and mid to distal vertebral arteries. Aortic arch: Calcified atherosclerosis without evidence of significant stenosis or aneurysm. Right carotid system: The visualized common carotid artery is within normal limits without significant stenosis or occlusion. Nondiagnostic evaluation of the distal common carotid artery and internal carotid artery to the level of the petrous carotid. Left carotid system: Calcified and noncalcified atherosclerosis of the common carotid artery without evidence of significant stenosis. Nondiagnostic evaluation of the distal common carotid artery and internal carotid artery to the level of the petrous carotid. Vertebral arteries: Approximately, no significant stenosis or occlusion. Nondiagnostic evaluation of the mid to distal vertebral arteries. Skeleton: Multilevel severe degenerative change. Other neck: No evidence of mass or adenopathy.  Upper chest: Partially imaged fibrotic lung changes, possibly related to the patient's history of sarcoidosis. Review of the MIP images confirms the above findings CTA HEAD FINDINGS Anterior circulation: Calcific atherosclerosis of bilateral cavernous carotids without evidence of greater than 50% narrowing. Bilateral M1 MCAs and proximal M2 MCA is are patent without evidence of significant stenosis. Distal MCA branches appear relatively symmetric. The right A1 ACA is hypoplastic with a large left A1 ACA, likely anatomic variation. Posterior circulation: No significant stenosis or occlusion of the distal V4 vertebral arteries and basilar artery. Fetal type right PCA. No significant stenosis identified of the posterior cerebral arteries. No aneurysm. Venous sinuses: As permitted by contrast timing, patent.ww Review of the MIP images confirms the above findings CT Brain Perfusion Findings: ASPECTS: 10 CBF (<30%) Volume: 039mPerfusion (Tmax>6.0s) volume: 5m60mismatch Volume: 5mL58mlculated T-max greater than 6 sec and mismatch is favored artifactual given location in a region of streak/motion and non vascular territory. IMPRESSION: 1. Overall limited evaluation given patient motion with nondiagnostic arterial evaluation in the upper neck/skull base, including the internal carotid arteries and mid to distal vertebral arteries in the neck. Within this limitation, no evidence of acute intracranial abnormality, large vessel occlusion or significant (greater than 50%) arterial stenosis. Repeat CTA neck  could further evaluate if clinically indicated. 2. No convincing penumbra. Small area of reported mismatch is favored artifactual. 3. Partially imaged fibrotic lung changes, possibly related to the patient's reported history of sarcoidosis. Code stroke imaging results were communicated on 07/16/2020 at 11:58 am to provider Dr. Doy Mince via telephone, who verbally acknowledged these results. Electronically Signed   By: Margaretha Sheffield MD   On: 07/16/2020 12:17    Medications:  I have reviewed the patient's current medications. Scheduled: . arformoterol  15 mcg Nebulization BID  . aspirin EC  81 mg Oral Daily  . budesonide (PULMICORT) nebulizer solution  0.25 mg Nebulization BID  . chlorhexidine  15 mL Mouth Rinse BID  . chlorhexidine gluconate (MEDLINE KIT)  15 mL Mouth Rinse BID  . Chlorhexidine Gluconate Cloth  6 each Topical Q0600  . docusate  100 mg Oral BID  . insulin aspart  0-15 Units Subcutaneous Q4H  . mouth rinse  15 mL Mouth Rinse 10 times per day  . methylPREDNISolone (SOLU-MEDROL) injection  20 mg Intravenous Daily  . pantoprazole (PROTONIX) IV  40 mg Intravenous QHS  . polyethylene glycol  17 g Oral Daily  . thiamine injection  100 mg Intravenous Daily  . Treprostinil  18 mcg Inhalation QID    Assessment/Plan: 64 y.o. female with a history of arrhythmia, sarcoid and pulmonary hypertension who was admitted on 9/24 with community-acquired pneumonia and urinary tract infection.  Felt to be septic, possible DIC.  Patient altered at presentation.  Required BiPAP.  Today on evaluation was felt to be more dysarthric.  Questionable weakness.  Code stroke initiated.    Preadmission modified rankin of 0.  Initial NIHSS of 16.   Patient not a tPA candidate due to unclear LKW.  To be evaluated for possible benefit that could be obtained of thrombectomy. CTA and CTP ordered as well as STAT head CT without contrast. Head CT personally reviewed and although with artifact shows no acute changes.  CTA personally reviewed and shows no evidence of LVO.  Some proximal vasculature difficult to evaluate due to quality of study secondary to patient cooperation.  CTP personally reviewed and shows no areas of significant hypoperfusion.   MRI of the brain personally reviewed and with significant artifact.  Unable to rule out the possibility of small acute infarcts.  Stroke work up initiated.  Echocardiogram shows decreased  EF at 35-40%, unclear if evidence of LV thrombus but with cyanotic toes and possible MRI findings this is a concern.   A1c 6.4.  EEG consistent with necephalopathy  Recommendations: 1. Consider TEE 2. PT consult, OT consult, Speech consult 3. Carotid dopplers to evaluate proximal vasculature 4. Agree with continued ASA 5. Telemetry monitoring 6. Frequent neuro checks 7. Avoid hypotension    LOS: 8 days   [name], MD Neurology [phone] 07/18/2020  11:13 AM

## 2020-07-18 NOTE — Progress Notes (Signed)
PT Cancellation Note  Patient Details Name: Stacie [name] MRN: [id number] DOB: October 30, 1955   Cancelled Treatment:    Reason Eval/Treat Not Completed:  (Consult received and chart reviewed.  Per primary RN, family just arrived to room.  Advised therapist allow family to visit and re-attempt at later time/date as appropriate.)   Ashdon Gillson H. Owens Shark, PT, DPT, NCS 07/18/20, 10:22 AM [phone]

## 2020-07-19 DIAGNOSIS — I639 Cerebral infarction, unspecified: Secondary | ICD-10-CM

## 2020-07-19 LAB — CBC WITH DIFFERENTIAL/PLATELET
Abs Immature Granulocytes: 0.79 10*3/uL — ABNORMAL HIGH (ref 0.00–0.07)
Basophils Absolute: 0.2 10*3/uL — ABNORMAL HIGH (ref 0.0–0.1)
Basophils Relative: 1 %
Eosinophils Absolute: 0.1 10*3/uL (ref 0.0–0.5)
Eosinophils Relative: 1 %
HCT: 43.9 % (ref 36.0–46.0)
Hemoglobin: 13.6 g/dL (ref 12.0–15.0)
Immature Granulocytes: 3 %
Lymphocytes Relative: 3 %
Lymphs Abs: 0.9 10*3/uL (ref 0.7–4.0)
MCH: 31.1 pg (ref 26.0–34.0)
MCHC: 31 g/dL (ref 30.0–36.0)
MCV: 100.2 fL — ABNORMAL HIGH (ref 80.0–100.0)
Monocytes Absolute: 2 10*3/uL — ABNORMAL HIGH (ref 0.1–1.0)
Monocytes Relative: 8 %
Neutro Abs: 22.2 10*3/uL — ABNORMAL HIGH (ref 1.7–7.7)
Neutrophils Relative %: 84 %
Platelets: 97 10*3/uL — ABNORMAL LOW (ref 150–400)
RBC: 4.38 MIL/uL (ref 3.87–5.11)
RDW: 13.7 % (ref 11.5–15.5)
Smear Review: DECREASED
WBC: 26.1 10*3/uL — ABNORMAL HIGH (ref 4.0–10.5)
nRBC: 0 % (ref 0.0–0.2)

## 2020-07-19 LAB — GLUCOSE, CAPILLARY
Glucose-Capillary: 150 mg/dL — ABNORMAL HIGH (ref 70–99)
Glucose-Capillary: 147 mg/dL — ABNORMAL HIGH (ref 70–99)
Glucose-Capillary: 169 mg/dL — ABNORMAL HIGH (ref 70–99)
Glucose-Capillary: 221 mg/dL — ABNORMAL HIGH (ref 70–99)
Glucose-Capillary: 236 mg/dL — ABNORMAL HIGH (ref 70–99)

## 2020-07-19 LAB — BASIC METABOLIC PANEL
Anion gap: 10 (ref 5–15)
BUN: 41 mg/dL — ABNORMAL HIGH (ref 8–23)
CO2: 33 mmol/L — ABNORMAL HIGH (ref 22–32)
Calcium: 7.5 mg/dL — ABNORMAL LOW (ref 8.9–10.3)
Chloride: 102 mmol/L (ref 98–111)
Creatinine, Ser: 1.13 mg/dL — ABNORMAL HIGH (ref 0.44–1.00)
GFR calc Af Amer: 59 mL/min — ABNORMAL LOW (ref >60)
GFR calc non Af Amer: 51 mL/min — ABNORMAL LOW (ref >60)
Glucose, Bld: 148 mg/dL — ABNORMAL HIGH (ref 70–99)
Potassium: 3.8 mmol/L (ref 3.5–5.1)
Sodium: 145 mmol/L (ref 135–145)

## 2020-07-19 LAB — MAGNESIUM: Magnesium: 1.7 mg/dL (ref 1.7–2.4)

## 2020-07-19 LAB — PHOSPHORUS: Phosphorus: 2.7 mg/dL (ref 2.5–4.6)

## 2020-07-19 MED ORDER — MAGNESIUM SULFATE 2 GM/50ML IV SOLN
2.0000 g | Freq: Once | INTRAVENOUS | Status: AC
  Administered 2020-07-19: 2 g via INTRAVENOUS
  Filled 2020-07-19: qty 50

## 2020-07-19 MED ORDER — BISACODYL 10 MG RE SUPP: 10.0000 mg | Freq: Every day | RECTAL | Status: DC | PRN

## 2020-07-19 NOTE — Progress Notes (Signed)
Subjective: She appears somewhat improved compared to 2 days ago  Exam: Vitals:   07/19/20 0600 07/19/20 1200  BP: 132/65 122/69  Pulse: 89 90  Resp: (!) 30 19  Temp:  (!) 97.5 F (36.4 C)  SpO2: 100% 98%   Gen: In bed, NAD Resp: non-labored breathing, no acute distress Abd: soft, nt  Neuro: MS: Awake, alert, oriented CN: Visual fields full, EOMI Motor: She has a right > left, distal > proximal paresis.  She is able to follow commands in all four extremities Sensory: She endorses symmetric sensation  Pertinent Labs: Creatinine 1.13  Impression: 64 year old female with a right greater than left paresis that occurred in the setting of severe sepsis.  I suspect that she had some degree of watershed injury in the infarct seen on the coronal MRI I think could be consistent with this, but multifocal emboli have also been considered.  Now that she may be a little more cooperative, I think a repeat MRI of the brain to get a better view of the insult would help determine etiology.   Recommendations: 1) repeat MRI of the brain   Roland Rack, MD [facility] [phone]  If 7pm- 7am, please page neurology on call as listed in [place].

## 2020-07-19 NOTE — Progress Notes (Signed)
Inpatient Rehab Admissions Coordinator:   I spoke with pt.'s daughter and husband to discuss potential CIR admission. They confirmed that they can provide 24/7 support at discharge and are interested in CIR placement. I Will pursue for potential admit once Pt. Is medically ready, pending insurance authorization and bed availability.   Clemens Catholic, [place], [place] Admissions Coordinator  [phone] ([place]) [phone] (office)

## 2020-07-19 NOTE — Progress Notes (Signed)
PHARMACY CONSULT NOTE - FOLLOW UP  Pharmacy Consult for Electrolyte Monitoring and Replacement   Recent Labs: Potassium (mmol/L)  Date Value  07/19/2020 3.8  12/28/2012 3.4 (L)   Magnesium (mg/dL)  Date Value  07/19/2020 1.7   Calcium (mg/dL)  Date Value  07/19/2020 7.5 (L)   Calcium, Total (mg/dL)  Date Value  12/28/2012 8.0 (L)   Albumin (g/dL)  Date Value  07/17/2020 2.8 (L)  05/27/2019 4.1  12/26/2012 3.0 (L)   Phosphorus (mg/dL)  Date Value  07/19/2020 2.7   Sodium (mmol/L)  Date Value  07/19/2020 145  05/27/2019 144  12/28/2012 142    Assessment: Pharmacy consulted to replace electrolytes in this 64 year old female admitted with sepsis of unknown source.. Pt with PMH of Stage 3 CKD and baseline Scr of 1.0 in May 2021. Nephrology is following.  9/27 Electrolytes WNL, with sodium slightly elevated at 147, on NS at 75 mL/hr ordered to stop tonight at 2359 . Scr elevated but trending down (4.49>>3.75>>3.12) with good UOP yesterday.Pt placed on Calcium Gluconate 1g twice daily.  9/28: Sodium continues to trend up, at 150 today. Not on maintenance fluids. Calcium improving on calcium gluconate 1g twice daily. Kidney function improving with good UOP.   9/29 D5 at 40 ml/hr added for hypernatremia, Na 150>156  9/30 Na 156>>155, D5 increased to 75 mL/hr overnight. Other electrolytes WNL. Pt NPO. Scr trending down 2.46>>2.18>>1.92 and having good UOP.   10/1: D5W at 100 ml/hr, Na 154 on morning BMP. Phos slightly low but unable to take PO, IV replacement limited by Na and K.  Goal of Therapy:  Electrolytes WNL   Plan:  10/3 am labs: K 3.8  Mag 1.7  Phos 2.7  Scr 1.13 D5W at 100 ml/hr.  Will order Magnesium sulfate 2 gm IV x1. F/u electrolytes with am labs  Brunella Wileman A, PharmD 07/19/2020 9:11 AM

## 2020-07-19 NOTE — Progress Notes (Signed)
[place], [place] 07/19/20  Subjective:   Hospital day # 9  Patient more awake and alert today. Urine output was 1.6 L over the preceding 24 hours. Creatinine down to 1.1 with a BUN of 41.  Renal: 10/02 0701 - 10/03 0700 In: 4136.7 [I.V.:4136.7] Out: 1600 [Urine:1600]   Objective:  Vital signs in last 24 hours:  Temp:  [97.5 F (36.4 C)-98.6 F (37 C)] 97.5 F (36.4 C) (10/03 1200) Pulse Rate:  [87-95] 90 (10/03 1200) Resp:  [19-30] 19 (10/03 1200) BP: (115-143)/(65-85) 122/69 (10/03 1200) SpO2:  [98 %-100 %] 98 % (10/03 1200) Weight:  [86.3 kg] 86.3 kg (10/03 0500)  Weight change:  Filed Weights   07/15/20 0432 07/16/20 0500 07/19/20 0500  Weight: 88.7 kg 87.5 kg 86.3 kg    Intake/Output:    Intake/Output Summary (Last 24 hours) at 07/19/2020 1319 Last data filed at 07/19/2020 0830 Gross per 24 hour  Intake 4136.7 ml  Output 3100 ml  Net 1036.7 ml     Physical Exam: General:  No acute distress  HEENT  Normocephalic,atraumatic, hearing intact  Pulm/lungs  mild bilateral wheezing, normal effort  CVS/Heart  S1S2 + regular  Abdomen:   Soft, nondistended  Extremities:  No peripheral edema  Neurologic:  Awake, alert  Skin:  Dusky appearance to both feet    Basic Metabolic Panel:  Recent Labs  Lab 07/15/20 0433 07/15/20 1343 07/16/20 0706 07/16/20 0706 07/16/20 0930 07/16/20 0930 07/16/20 1329 07/16/20 2122 07/17/20 0314 07/18/20 0424 07/19/20 0530  NA 156*   < > 156*   < > 155*   < > 150* 154* 154* 146* 145  K 4.5   < > 4.4  --  4.3  --   --   --  4.4 4.3 3.8  CL 114*   < > 115*  --  114*  --   --   --  109 103 102  CO2 31   < > 31  --  31  --   --   --  31 28 33*  GLUCOSE 212*   < > 230*  --  237*  --   --   --  234* 194* 148*  BUN 76*   < > 76*  --  77*  --   --   --  60* 51* 41*  CREATININE 2.35*   < > 1.92*  --  1.85*  --   --   --  1.49* 1.32* 1.13*  CALCIUM 7.6*   < > 7.9*   < > 8.1*   < >  --   --  7.9* 7.9*  7.5*  MG 2.5*  --  2.2  --   --   --   --   --  1.9 1.9 1.7  PHOS 5.8*  --  3.2  --   --   --   --   --  2.4* 2.7 2.7   < > = values in this interval not displayed.     CBC: Recent Labs  Lab 07/15/20 0433 07/16/20 0706 07/17/20 0314 07/18/20 0424 07/19/20 0530  WBC 13.7* 19.6* 26.0* 22.9* 26.1*  NEUTROABS 11.5* 16.0* 22.2* 19.3* 22.2*  HGB 13.1 13.1 13.8 13.8 13.6  HCT 42.6 42.8 45.7 45.3 43.9  MCV 99.1 100.2* 100.9* 101.1* 100.2*  PLT 64* 77* 76* 83* 97*      Lab Results  Component Value Date   HEPBSAG NON REACTIVE 07/15/2020   HEPBIGM NON REACTIVE  07/15/2020      Microbiology:  Recent Results (from the past 240 hour(s))  Urine culture     Status: Abnormal   Collection Time: 07/09/20 11:27 PM   Specimen: In/Out Cath Urine  Result Value Ref Range Status   Specimen Description   Final    IN/OUT CATH URINE Performed at [facility], [address] [place], [place] [address]    Special Requests   Final    NONE Performed at [facility], [place], [place] [address]    Culture >=100,000 COLONIES/mL KLEBSIELLA PNEUMONIAE (A)  Final   Report Status 07/12/2020 FINAL  Final   Organism ID, Bacteria KLEBSIELLA PNEUMONIAE (A)  Final      Susceptibility   Klebsiella pneumoniae - MIC*    AMPICILLIN RESISTANT Resistant     CEFAZOLIN <=4 SENSITIVE Sensitive     CEFTRIAXONE <=0.25 SENSITIVE Sensitive     CIPROFLOXACIN <=0.25 SENSITIVE Sensitive     GENTAMICIN <=1 SENSITIVE Sensitive     IMIPENEM <=0.25 SENSITIVE Sensitive     NITROFURANTOIN <=16 SENSITIVE Sensitive     TRIMETH/SULFA <=20 SENSITIVE Sensitive     AMPICILLIN/SULBACTAM <=2 SENSITIVE Sensitive     PIP/TAZO <=4 SENSITIVE Sensitive     * >=100,000 COLONIES/mL KLEBSIELLA PNEUMONIAE  Respiratory Panel by RT PCR (Flu A&B, Covid) - Nasopharyngeal Swab     Status: None   Collection Time: 07/09/20 11:28 PM   Specimen: Nasopharyngeal Swab  Result Value Ref Range Status   SARS  Coronavirus 2 by RT PCR NEGATIVE NEGATIVE Final    Comment: (NOTE) SARS-CoV-2 target nucleic acids are NOT DETECTED.  The SARS-CoV-2 RNA is generally detectable in upper respiratoy specimens during the acute phase of infection. The lowest concentration of SARS-CoV-2 viral copies this assay can detect is 131 copies/mL. A negative result does not preclude SARS-Cov-2 infection and should not be used as the sole basis for treatment or other patient management decisions. A negative result may occur with  improper specimen collection/handling, submission of specimen other than nasopharyngeal swab, presence of viral mutation(s) within the areas targeted by this assay, and inadequate number of viral copies (<131 copies/mL). A negative result must be combined with clinical observations, patient history, and epidemiological information. The expected result is Negative.  Fact Sheet for Patients:  PinkCheek.be  Fact Sheet for Healthcare Providers:  GravelBags.it  This test is no t yet approved or cleared by the [place] FDA and  has been authorized for detection and/or diagnosis of SARS-CoV-2 by FDA under an Emergency Use Authorization (EUA). This EUA will remain  in effect (meaning this test can be used) for the duration of the COVID-19 declaration under Section 564(b)(1) of the Act, 21 U.S.C. section 360bbb-3(b)(1), unless the authorization is terminated or revoked sooner.     Influenza A by PCR NEGATIVE NEGATIVE Final   Influenza B by PCR NEGATIVE NEGATIVE Final    Comment: (NOTE) The Xpert Xpress SARS-CoV-2/FLU/RSV assay is intended as an aid in  the diagnosis of influenza from Nasopharyngeal swab specimens and  should not be used as a sole basis for treatment. Nasal washings and  aspirates are unacceptable for Xpert Xpress SARS-CoV-2/FLU/RSV  testing.  Fact Sheet for  Patients: PinkCheek.be  Fact Sheet for Healthcare Providers: GravelBags.it  This test is not yet approved or cleared by the [place] FDA and  has been authorized for detection and/or diagnosis of SARS-CoV-2 by  FDA under an Emergency Use Authorization (EUA). This EUA will remain  in effect (meaning this test can be used) for the duration of the  Covid-19 declaration under Section 564(b)(1) of the Act, 21  U.S.C. section 360bbb-3(b)(1), unless the authorization is  terminated or revoked. Performed at [facility], [address] [place], [place] [address]   Blood Culture (routine x 2)     Status: Abnormal   Collection Time: 07/09/20 11:36 PM   Specimen: BLOOD  Result Value Ref Range Status   Specimen Description   Final    BLOOD RIGHT ANTECUBITAL Performed at [facility], [address] [place], [place] [address]    Special Requests   Final    BOTTLES DRAWN AEROBIC AND ANAEROBIC Blood Culture adequate volume Performed at [facility], [address] [place], [place] [address]    Culture  Setup Time   Final    Organism ID to follow GRAM NEGATIVE RODS IN BOTH AEROBIC AND ANAEROBIC BOTTLES CRITICAL RESULT CALLED TO, READ BACK BY AND VERIFIED WITH: AMY THOMPSON AT 1220 07/10/20 SDR Performed at [facility], [address] [place], [place] [address]    Culture KLEBSIELLA PNEUMONIAE (A)  Final   Report Status 07/12/2020 FINAL  Final   Organism ID, Bacteria KLEBSIELLA PNEUMONIAE  Final      Susceptibility   Klebsiella pneumoniae - MIC*    AMPICILLIN RESISTANT Resistant     CEFAZOLIN <=4 SENSITIVE Sensitive     CEFEPIME <=0.12 SENSITIVE Sensitive     CEFTAZIDIME <=1 SENSITIVE Sensitive     CEFTRIAXONE <=0.25 SENSITIVE Sensitive     CIPROFLOXACIN <=0.25 SENSITIVE Sensitive     GENTAMICIN <=1 SENSITIVE Sensitive     IMIPENEM <=0.25 SENSITIVE Sensitive     TRIMETH/SULFA <=20  SENSITIVE Sensitive     AMPICILLIN/SULBACTAM <=2 SENSITIVE Sensitive     PIP/TAZO <=4 SENSITIVE Sensitive     * KLEBSIELLA PNEUMONIAE  Blood Culture ID Panel (Reflexed)     Status: Abnormal   Collection Time: 07/09/20 11:36 PM  Result Value Ref Range Status   Enterococcus faecalis NOT DETECTED NOT DETECTED Final   Enterococcus Faecium NOT DETECTED NOT DETECTED Final   Listeria monocytogenes NOT DETECTED NOT DETECTED Final   Staphylococcus species NOT DETECTED NOT DETECTED Final   Staphylococcus aureus (BCID) NOT DETECTED NOT DETECTED Final   Staphylococcus epidermidis NOT DETECTED NOT DETECTED Final   Staphylococcus lugdunensis NOT DETECTED NOT DETECTED Final   Streptococcus species NOT DETECTED NOT DETECTED Final   Streptococcus agalactiae NOT DETECTED NOT DETECTED Final   Streptococcus pneumoniae NOT DETECTED NOT DETECTED Final   Streptococcus pyogenes NOT DETECTED NOT DETECTED Final   A.calcoaceticus-baumannii NOT DETECTED NOT DETECTED Final   Bacteroides fragilis NOT DETECTED NOT DETECTED Final   Enterobacterales DETECTED (A) NOT DETECTED Final    Comment: Enterobacterales represent a large order of gram negative bacteria, not a single organism. CRITICAL RESULT CALLED TO, READ BACK BY AND VERIFIED WITH:  AMY THOMPSON AT 2956 04/09/20 SDR    Enterobacter cloacae complex NOT DETECTED NOT DETECTED Final   Escherichia coli NOT DETECTED NOT DETECTED Final   Klebsiella aerogenes NOT DETECTED NOT DETECTED Final   Klebsiella oxytoca NOT DETECTED NOT DETECTED Final   Klebsiella pneumoniae DETECTED (A) NOT DETECTED Final    Comment: CRITICAL RESULT CALLED TO, READ BACK BY AND VERIFIED WITH:  AMY THOMPSON AT 1220 07/10/20 SDR    Proteus species NOT DETECTED NOT DETECTED Final   Salmonella species NOT DETECTED NOT DETECTED Final   Serratia marcescens NOT DETECTED NOT DETECTED Final  Haemophilus influenzae NOT DETECTED NOT DETECTED Final   Neisseria meningitidis NOT DETECTED NOT DETECTED  Final   Pseudomonas aeruginosa NOT DETECTED NOT DETECTED Final   Stenotrophomonas maltophilia NOT DETECTED NOT DETECTED Final   Candida albicans NOT DETECTED NOT DETECTED Final   Candida auris NOT DETECTED NOT DETECTED Final   Candida glabrata NOT DETECTED NOT DETECTED Final   Candida krusei NOT DETECTED NOT DETECTED Final   Candida parapsilosis NOT DETECTED NOT DETECTED Final   Candida tropicalis NOT DETECTED NOT DETECTED Final   Cryptococcus neoformans/gattii NOT DETECTED NOT DETECTED Final   CTX-M ESBL NOT DETECTED NOT DETECTED Final   Carbapenem resistance IMP NOT DETECTED NOT DETECTED Final   Carbapenem resistance KPC NOT DETECTED NOT DETECTED Final   Carbapenem resistance NDM NOT DETECTED NOT DETECTED Final   Carbapenem resist OXA 48 LIKE NOT DETECTED NOT DETECTED Final   Carbapenem resistance VIM NOT DETECTED NOT DETECTED Final    Comment: Performed at [facility], [address] [place], [place] [address]  Blood Culture (routine x 2)     Status: Abnormal   Collection Time: 07/09/20 11:37 PM   Specimen: BLOOD  Result Value Ref Range Status   Specimen Description   Final    BLOOD BLOOD RIGHT FOREARM Performed at [facility], [address] [place], [place] [address]    Special Requests   Final    BOTTLES DRAWN AEROBIC AND ANAEROBIC Blood Culture adequate volume Performed at [facility], [address] [place], [place] [address]    Culture  Setup Time   Final    GRAM NEGATIVE RODS IN BOTH AEROBIC AND ANAEROBIC BOTTLES CRITICAL RESULT CALLED TO, READ BACK BY AND VERIFIED WITH: AMY THOMPSON AT 1220 07/10/20 SDR Performed at [facility], [address] [place], [place] [address]    Culture (A)  Final    KLEBSIELLA PNEUMONIAE SUSCEPTIBILITIES PERFORMED ON PREVIOUS CULTURE WITHIN THE LAST 5 DAYS. Performed at [facility], [place] [address], [place], [place] [address]    Report Status 07/12/2020 FINAL  Final  Aspergillus Ag,  BAL/Serum     Status: None   Collection Time: 07/10/20  5:04 AM   Specimen: Blood  Result Value Ref Range Status   Aspergillus Ag, BAL/Serum 0.03 0.00 - 0.49 Index Final    Comment: (NOTE) Performed At: [facility] [place], [place] [id number] Rush Farmer MD VW:[id number]   MRSA PCR Screening     Status: None   Collection Time: 07/10/20 12:54 PM   Specimen: Nasal Mucosa; Nasopharyngeal  Result Value Ref Range Status   MRSA by PCR NEGATIVE NEGATIVE Final    Comment:        The GeneXpert MRSA Assay (FDA approved for NASAL specimens only), is one component of a comprehensive MRSA colonization surveillance program. It is not intended to diagnose MRSA infection nor to guide or monitor treatment for MRSA infections. Performed at [facility], [address] [place], [place] [address]   Culture, blood (Routine X 2) w Reflex to ID Panel     Status: None (Preliminary result)   Collection Time: 07/16/20  1:29 PM   Specimen: BLOOD  Result Value Ref Range Status   Specimen Description BLOOD LEFT ANTECUBITAL  Final   Special Requests   Final    AEROBIC BOTTLE ONLY Blood Culture results may not be optimal due to an inadequate volume of blood received in culture bottles   Culture   Final  NO GROWTH 3 DAYS Performed at [facility], [address] [place], [place] [address]    Report Status PENDING  Incomplete  Culture, blood (Routine X 2) w Reflex to ID Panel     Status: None (Preliminary result)   Collection Time: 07/16/20  5:09 PM   Specimen: BLOOD  Result Value Ref Range Status   Specimen Description BLOOD BLOOD RIGHT HAND  Final   Special Requests   Final    BOTTLES DRAWN AEROBIC ONLY Blood Culture results may not be optimal due to an inadequate volume of blood received in culture bottles   Culture   Final    NO GROWTH 3 DAYS Performed at [facility], [address], [place], [place] [address]    Report Status PENDING   Incomplete    Coagulation Studies: No results for input(s): LABPROT, INR in the last 72 hours.  Urinalysis: No results for input(s): COLORURINE, LABSPEC, PHURINE, GLUCOSEU, HGBUR, BILIRUBINUR, KETONESUR, PROTEINUR, UROBILINOGEN, NITRITE, LEUKOCYTESUR in the last 72 hours.  Invalid input(s): APPERANCEUR    Imaging: US Carotid Bilateral  Result Date: 07/19/2020 CLINICAL DATA:  64 year old female with stroke EXAM: BILATERAL CAROTID DUPLEX ULTRASOUND TECHNIQUE: Pearline Cables scale imaging, color Doppler and duplex ultrasound were performed of bilateral carotid and vertebral arteries in the neck. COMPARISON:  None. FINDINGS: Criteria: Quantification of carotid stenosis is based on velocity parameters that correlate the residual internal carotid diameter with NASCET-based stenosis levels, using the diameter of the distal internal carotid lumen as the denominator for stenosis measurement. The following velocity measurements were obtained: RIGHT ICA:  Systolic 82 cm/sec, Diastolic 22 cm/sec CCA:  56 cm/sec SYSTOLIC ICA/CCA RATIO:  1.5 ECA:  80 cm/sec LEFT ICA:  Systolic 539 cm/sec, Diastolic 31 cm/sec CCA:  69 cm/sec SYSTOLIC ICA/CCA RATIO:  1.5 ECA:  67 cm/sec Right Brachial SBP: Not acquired Left Brachial SBP: Not acquired RIGHT CAROTID ARTERY: No significant calcifications of the right common carotid artery. Intermediate waveform maintained. Heterogeneous and partially calcified plaque at the right carotid bifurcation. No significant lumen shadowing. Low resistance waveform of the right ICA. No significant tortuosity. RIGHT VERTEBRAL ARTERY: Antegrade flow with low resistance waveform. LEFT CAROTID ARTERY: No significant calcifications of the left common carotid artery. Intermediate waveform maintained. Heterogeneous and partially calcified plaque at the left carotid bifurcation without significant lumen shadowing. Low resistance waveform of the left ICA. No significant tortuosity. LEFT VERTEBRAL ARTERY:   Antegrade flow with low resistance waveform. IMPRESSION: Color duplex indicates minimal heterogeneous and calcified plaque, with no hemodynamically significant stenosis by duplex criteria in the extracranial cerebrovascular circulation. Signed, Dulcy Fanny. Dellia Nims, RPVI [facility] Radiology Electronically Signed   By: Corrie Mckusick D.O.   On: 07/19/2020 05:40     Medications:   . cefTRIAXone (ROCEPHIN)  IV 2 g (07/18/20 1605)  . dextrose 100 mL/hr at 07/18/20 2339   . arformoterol  15 mcg Nebulization BID  . aspirin EC  81 mg Oral Daily  . budesonide (PULMICORT) nebulizer solution  0.25 mg Nebulization BID  . chlorhexidine  15 mL Mouth Rinse BID  . chlorhexidine gluconate (MEDLINE KIT)  15 mL Mouth Rinse BID  . Chlorhexidine Gluconate Cloth  6 each Topical Q0600  . docusate  100 mg Oral BID  . insulin aspart  0-15 Units Subcutaneous Q4H  . mouth rinse  15 mL Mouth Rinse 10 times per day  . methylPREDNISolone (SOLU-MEDROL) injection  20 mg Intravenous Daily  . pantoprazole (PROTONIX) IV  40  mg Intravenous QHS  . polyethylene glycol  17 g Oral Daily  . thiamine injection  100 mg Intravenous Daily  . Treprostinil  18 mcg Inhalation QID   acetaminophen, bisacodyl, docusate sodium, polyethylene glycol  Assessment/ Plan:  64 y.o. female with  sarcoidosis, interstitial lung disease, obstructive sleep apnea, depression, GERD, hyperlipidemia, hypothyroidism   admitted on 07/09/2020 for Elevated LFTs [R79.89] Sepsis ([place]) [A41.9] Sepsis, due to unspecified organism, unspecified whether acute organ dysfunction present [facility]) [A41.9]  # AKI Baseline Cr 1.0 in May 2021 Admi Cr 4.16, peaked at 4.57 -Patient continues to make excellent renal progress.  Creatinine down to 1.1 with urine output of 1.6.  Avoid nephrotoxins and contrast as possible. Lab Results  Component Value Date   CREATININE 1.13 (H) 07/19/2020   CREATININE 1.32 (H) 07/18/2020    CREATININE 1.49 (H) 07/17/2020     #Sepsis from urinary source Klebsiella pneumonia and blood in urine Patient has significant history of renal stones.  History of left ureteroscopy May 2021  CT Abd/Pelvis on  07/14/20 IMPRESSION: 7-8 mm proximal right ureteral stone with obstructive change and perinephric stranding. Urology placed ureteral stents bilaterally on 07/14/20 F/C draining blood tinged urine   #History of pulmonary sarcoidosis, pulmonary hypertension Acute on chronic respiratory failure, community-acquired pneumonia  requires home oxygen and CPAP Was intubated and mechanically ventilated - now extubated -Patient continues to do well post extubation.  # Hypernatremia Serum sodium down to 145.  Okay to continue D5W until her p.o. intake improves a bit.    LOS: 9 Ashaun Gaughan 10/3/20211:19 PM  [place], [place]  Note: This note was prepared with Dragon dictation. Any transcription errors are unintentional

## 2020-07-19 NOTE — Progress Notes (Signed)
Physical Therapy Treatment Patient Details Name: Stacie [name] MRN: [id number] DOB: 09/07/1956 Today's Date: 07/19/2020    History of Present Illness Stacie [name] is a 64 y.o. Female with a past medical history significant for pulmonary sarcoidosis (on 2L [place] at baseline), pulmonary hypertension, OSA, asthma, hypertension, hypothyroidism, IBS, and chronic kidney disease who presents to [facility] ED on 07/09/20 due to altered mental status and shortness of breath.  Admitted for management of acute hypoxic respiratory failure and sepsis related to CAP, UTI.  Hospital course significant for periods of delirium requiring precedex infusion; questionable DIC with ischemic changes to L foot, R great toe; cystoscopy with pyelogram (9/28); extubation (9/29); and acute code stroke (9/30) due to new-onset slurred speech, R hemiparesis (NIHSS 16).    PT Comments    Pt ready for session but reports fatigue from nursing activity/bath.  Hair still wet from bathing.  She does participate in supine ex A/AA/PROM at times depending on activity and fatigue level.  Rest periods given.  While pt put sin good effort she is physically fatigued and session limited.  Lights dimmed at end of session for rest.     Follow Up Recommendations  CIR     Equipment Recommendations       Recommendations for Other Services       Precautions / Restrictions Precautions Precautions: Fall Restrictions Weight Bearing Restrictions: No    Mobility  Bed Mobility                  Transfers                    Ambulation/Gait                 Stairs             Wheelchair Mobility    Modified Rankin (Stroke Patients Only)       Balance                                            Cognition Arousal/Alertness: Awake/alert Behavior During Therapy: WFL for tasks assessed/performed Overall Cognitive Status: Within Functional Limits for tasks assessed                                         Exercises Other Exercises Other Exercises: BLE A/AAROM/PROM at times due to fatiue    General Comments        Pertinent Vitals/Pain Pain Assessment: Faces Faces Pain Scale: Hurts a little bit Pain Location: bilat LEs Pain Intervention(s): Limited activity within patient's tolerance;Repositioned    Home Living   Living Arrangements: Spouse/significant other Available Help at Discharge: Family Type of Home: House Home Access: Stairs to enter Entrance Stairs-Rails: None Home Layout: Two level;Able to live on main level with bedroom/bathroom        Prior Function            PT Goals (current goals can now be found in the care plan section) Progress towards PT goals: Progressing toward goals    Frequency    Min 2X/week      PT Plan Current plan remains appropriate    Co-evaluation              AM-PAC PT "6 Clicks" Mobility  Outcome Measure  Help needed turning from your back to your side while in a flat bed without using bedrails?: Total Help needed moving from lying on your back to sitting on the side of a flat bed without using bedrails?: Total Help needed moving to and from a bed to a chair (including a wheelchair)?: Total Help needed standing up from a chair using your arms (e.g., wheelchair or bedside chair)?: Total Help needed to walk in hospital room?: Total Help needed climbing 3-5 steps with a railing? : Total 6 Click Score: 6    End of Session Equipment Utilized During Treatment: Oxygen Activity Tolerance: Patient limited by fatigue Patient left: in bed;with call bell/phone within reach;with family/visitor present;with bed alarm set Nurse Communication: Mobility status Hemiplegia - Right/Left: Right Hemiplegia - dominant/non-dominant: Dominant Hemiplegia - caused by: Cerebral infarction     Time: 7622-6333 PT Time Calculation (min) (ACUTE ONLY): 16 min  Charges:  $Therapeutic Exercise: 8-22 mins                     Chesley Noon, PTA 07/19/20, 11:55 AM

## 2020-07-19 NOTE — Progress Notes (Signed)
Notified Critical care NP of elevated blood glucose and that patient is no longer npo, received order to discontinue IV fluids.

## 2020-07-19 NOTE — Progress Notes (Signed)
Name: Stacie [name] MRN: [id number] DOB: 1956-04-08    ADMISSION DATE:  07/09/2020 INITIAL CONSULTATION DATE:  07/10/2020  REFERRING MD :  Dr. Owens Shark  CHIEF COMPLAINT:  Altered Mental Status & Shortness of Breath  BRIEF PATIENT DESCRIPTION:  64 y.o. Female admitted with Sepsis and Acute on Chronic Hypoxic Respiratory Failure in the setting of Community Acquired Pneumonia and Acute Exacerbation of Pulmonary Sarcoidosis requiring BiPAP. Pt also with UTI and AKI, Nephrology has been consulted.  SIGNIFICANT EVENTS  9/23: Presented to ED, placed on BiPAP 9/24: PCCM asked to admit 9/25: Klebsiella pneumoniae bacteremia due to UTI, associated encephalopathy 9/26: Persistent encephalopathy 9/27- Patient remains encephalopathic on BIPAP, she is oozing blood from peripheral veins and both LE toes are with ischemic changes which is new per husband.  I have called and updated Dr Kathyrn Sheriff regarding patients condition.  Concern is possible DIC onset since INR is incrementing up and PIV oozing on exam.  Patient is not on vasopressor support at this time. Restarted solumedrol due to chronic steroid therapy at home. Discussed case with Dr Candiss Norse nephrology and Dr Duwayne Heck.   9/28- patient with improved platelets this am, LFTs abnormal will have [place] RUQ today. ID on case s/p evaluation. Patient off sedation and is able to use BIPAP with AVAPS setting no overnight events.  Met with family at bedside including husband Dr Kathyrn Sheriff and daughter [name]. 07/15/20- patient is sedated this am we are weaning off sedatives and analgesics in prep for SBT to liberate from mechanical ventilator.  She is supported by 40 on neosynephrine this am, this is being weaned.  Na is increased to >150 plan is to start D5w and recheck BMP at 2pm.   07/16/20-  Patient has been altered with encephalopathy despite improvement in vitals and liberation off mechanical ventilation.  She was noted to have dysrarthria, right hemiparesis and right  facial drooping during my evaluation this am. Have called code stroke and neurologic evaluation is in progress s/p MRI/CTA brain and EEG.  07/17/20- patient is with aphasia and right sided weakness.  She was evaluated by neurologist today. She was unable to swallow appropriately.  07/18/20- patient is able to speak better today. She had repeat swallow evaluation and did much better with ability to initiate diet PO 07/19/20- patient seems to be with improved articulation and is able to participate with PT. I have met with husband and daughter and discussed care plan.   STUDIES:  9/23: CXR>>Cardiac shadow is stable. Chronic scarring is noted in the apices bilaterally with hilar retraction consistent with the given clinical history of prior sarcoidosis. Patchy increased airspace opacities are noted in the lower lungs which may represent some acute on chronic infiltrate. Changes of prior vertebral augmentation are seen. No acute bony abnormality is noted. 9/24: CT Head>>Chronic white matter ischemic changes without acute abnormality 9/24: CT Chest w/o Contrast>>Chronic changes of pulmonary sarcoidosis with biapical predominant pulmonary fibrosis. Very mild superimposed active inflammatory infiltrate. Morphologic changes compatible with pulmonary arterial hypertension.  CULTURES: SARS-CoV-2 PCR 9/23>> negative Influenza A&B 9/23>>negative Respiratory Viral Panel 9/24>> negative Blood culture 9/23>> Klebsiella pneumoniae all 4 bottles Sputum 9/24>> unable to obtain, nonproductive Urine 9/23>> Klebsiella pneumonia Strep pneumo urinary antigen 9/24>> not obtained Legionella urinary antigen 9/24>> not obtained Aspergillus 9/24>> pending Fungitel 9/24>> pending C. Diff 9/24>> no sample  ANTIBIOTICS: Azithromycin x1 dose 9/23 Ceftazidime x1 dose 9/23 Cefepime 9/24>> 9/24 Vancomycin 9/24>> 9/24 Ceftriaxone 9/24>>  PATIENT PROFILE:   Stacie [name] is a 64 y.o.  Female with a past medical  history significant for pulmonary sarcoidosis (on 2L [place] at baseline), pulmonary hypertension, OSA, asthma, hypertension, hypothyroidism, IBS, and chronic kidney disease who presents to [facility] ED on 07/09/20 due to altered mental status and shortness of breath.  Pt is currently confused and unable to contribute to history, and no family present, therefore history is obtained from ED and nursing notes.  PCCM asked to admit patient.  Patient has Klebsiella pneumonia bacteremia and severe sepsis.  Encephalopathy secondary to the same.   Allergies  Allergen Reactions  . Corn-Containing Products Diarrhea    headaches  . Nitrofurantoin Nausea Only  . Tramadol     Nausea and vomiting  . Sulfa Antibiotics Rash    As an infant   Scheduled Meds: . arformoterol  15 mcg Nebulization BID  . aspirin EC  81 mg Oral Daily  . budesonide (PULMICORT) nebulizer solution  0.25 mg Nebulization BID  . chlorhexidine  15 mL Mouth Rinse BID  . chlorhexidine gluconate (MEDLINE KIT)  15 mL Mouth Rinse BID  . Chlorhexidine Gluconate Cloth  6 each Topical Q0600  . docusate  100 mg Oral BID  . insulin aspart  0-15 Units Subcutaneous Q4H  . mouth rinse  15 mL Mouth Rinse 10 times per day  . methylPREDNISolone (SOLU-MEDROL) injection  20 mg Intravenous Daily  . pantoprazole (PROTONIX) IV  40 mg Intravenous QHS  . polyethylene glycol  17 g Oral Daily  . thiamine injection  100 mg Intravenous Daily  . Treprostinil  18 mcg Inhalation QID   Continuous Infusions: . cefTRIAXone (ROCEPHIN)  IV 2 g (07/18/20 1605)  . dextrose 100 mL/hr at 07/18/20 2339   PRN Meds:.acetaminophen, bisacodyl, docusate sodium, polyethylene glycol   REVIEW OF SYSTEMS:   Unable to assess due to AMS  SUBJECTIVE:  Unable to assess due to AMS  VITAL SIGNS: Temp:  [97.5 F (36.4 C)-98.6 F (37 C)] 97.5 F (36.4 C) (10/03 1200) Pulse Rate:  [87-95] 90 (10/03 1200) Resp:  [19-30] 19 (10/03 1200) BP: (115-143)/(65-85) 122/69 (10/03  1200) SpO2:  [98 %-100 %] 98 % (10/03 1200) Weight:  [86.3 kg] 86.3 kg (10/03 0500)  PHYSICAL EXAMINATION: General:  Acute on chronically ill appearing female, laying in bed, on BiPAP, with mild respiratory distress, confused Neuro: Confused, erratic following of commands, no focal deficits, pupils PERRL HEENT:  Atraumatic, normocephalic, neck supple, no JVD Cardiovascular: Regular rate and rhythm, s1s2, no M/R/G Lungs:  Diffuse rhonchi, no wheezing, mild tachypnea, mild assessory muscle use, BiPAP assisted Abdomen:  Obese, soft, nontender, nondistended, no guarding or rebound tenderness, BS+ x4 Musculoskeletal: No deformities, no edema Skin:  Warm and dry.  No obvious rashes, lesions, or ulcerations  Recent Labs  Lab 07/17/20 0314 07/18/20 0424 07/19/20 0530  NA 154* 146* 145  K 4.4 4.3 3.8  CL 109 103 102  CO2 31 28 33*  BUN 60* 51* 41*  CREATININE 1.49* 1.32* 1.13*  GLUCOSE 234* 194* 148*   Recent Labs  Lab 07/17/20 0314 07/18/20 0424 07/19/20 0530  HGB 13.8 13.8 13.6  HCT 45.7 45.3 43.9  WBC 26.0* 22.9* 26.1*  PLT 76* 83* 97*   ABG    Component Value Date/Time   PHART 7.41 07/16/2020 0841   PCO2ART 52 (H) 07/16/2020 0841   PO2ART 76 (L) 07/16/2020 0841   HCO3 33.0 (H) 07/16/2020 0841   TCO2 29 02/20/2020 1156   ACIDBASEDEF 0.5 07/12/2020 1205   O2SAT 95.2 07/16/2020 0841  ASSESSMENT / PLAN:  Acute on Chronic Hypoxic Respiratory Failure secondary to acute Exacerbation of Pulmonary Sarcoidosis/ALI -Supplemental O2 as needed to maintain O2 sats >92% -BiPAP, wean as tolerated -Follow intermittent CXR & ABG as needed -ABX as above-Rocephin - ID on case appreciate input -IV Steroids restarted-Solumedrol 40>>20 daily IV -LABA/ICS: Arformoterol and Pulmicort -continue Tyvasso as able   Acute metabolic encephalopathy-resolved Due to severe sepsis/gram-negative bacteremia- klebsiella    - present on admission     - due to UTI Uremia Precedex General  supportive care Restarted solumedrol  Support of renal function Thiamine Check free T4, TSH low -Rocephin/zithromax for abx - ID on case appreciate input 07/16/20- patient has signs of acute CVA- neuro workup in progress.  10/2- mentation and aphasia is improved, swallow study is betteter with advance in progress.    Acute CVA  - residual right sided hemiparesis with aphasia  -neurology on case - appreciate input  - PT/OT -asa/statin   Severe sepsis secondary to Kleibsiella pneumoniae bactermia-improved  UTI, recurrent -Monitor fever curve -Trend WBC's & Procalcitonin -Follow cultures as above -Received Azithromycin & Ceftazidime in ED>>rocephin /zithromax -Discontinued Vancomycin & Cefepime -Continue Rocephin   Obstructing calculi of right kidney and nonobstructing calculi of left ureter.    - s/p surgical decompression with stent placement - Urology on case appreciate input   - creat improved this am    -s/p repeat CTabd/pelvis - no concerning findings to elucidate reason for worsening leukocytosis-07/17/20  AKI   -significantly improved -07/19/20 -Anion Gap Metabolic Acidosis, corrected -Monitor I&O's / urinary output -Follow BMP -Ensure adequate renal perfusion -Avoid nephrotoxic agents as able -Replace electrolytes as indicated -Na Bicarb alternated with normal saline per renal -Nephrology consulted, appreciate input   Mildly elevated troponin, suspected demand ischemia -Continuous cardiac monitoring -Maintain MAP >65 -EKG without ST changes   Transaminitis -NPO -Trend LFT's -Obtain Abdominal US   BEST PRACTICES DISPOSITION: ICU GOALS OF CARE: Full Code VTE Prophylaxis: Heparin SQ Consults: Nephrology Updates: No family at bedside for update 07/12/20 during MD rounds  Critical care provider statement:    Critical care time (minutes):  33   Critical care time was exclusive of:  Separately billable procedures and  treating other patients   Critical  care was necessary to treat or prevent imminent or  life-threatening deterioration of the following conditions:  sepsis, klebsiella bacteremia, multiple comorbid conditions.    Critical care was time spent personally by me on the following  activities:  Development of treatment plan with patient or surrogate,  discussions with consultants, evaluation of patient's response to  treatment, examination of patient, obtaining history from patient or  surrogate, ordering and performing treatments and interventions, ordering  and review of laboratory studies and re-evaluation of patient's condition   I assumed direction of critical care for this patient from another  provider in my specialty: no     Ottie Glazier, M.D.  [facility] [place]   07/19/2020, 5:20 PM  *This note was dictated using voice recognition software/Dragon.  Despite best efforts to proofread, errors can occur which can change the meaning.  Any change was purely unintentional.

## 2020-07-19 NOTE — Progress Notes (Signed)
Patient slept throughout he night with CPAP in place. Patient was bathed. Frequent mouth care provided as ordered. Patient noted with increased discoloration to left foot and cyanosis to nail beds on both feet. Dorsal pedal pulses +1. Provider made aware. Turned and repositioned frequently. Tolerating nectar thick water. Can not tolerate liquid colace. Provider changed the future stool softners to to suppository. Supervisor notified of the need to thickener. NIHSS is 15. Call light kept within reach. Will endorse.

## 2020-07-19 NOTE — PMR Pre-admission (Addendum)
PMR Admission Coordinator Pre-Admission Assessment  Patient: Stacie [name] is an 64 y.o., female MRN: [id number] DOB: [birth year] Height: 5' 7.5" (171.5 cm) Weight: 84 kg              Insurance Information HMO:     PPO: yes    PCP:      IPA:      80/20:      OTHER: Group [id number] PRIMARY: BCBS Commercial      Policy#: [id number]      Subscriber: Pt.  CM Name:       Phone#:      Fax#: [phone] Pre-Cert#: [id number]   Authorization effective 07/24/20.  Updates due on 08/13/20.   Employer:  Benefits:  Phone #: [phone]     Name:  Eff. Date: 04/16/20     Deduct: $7000 (met $7000)      Out of Pocket Max: $7000 (met $7000)      Life Max: N/A  CIR: 100%      SNF: 100% Outpatient: 100%     Co-Pay: none Home Health: 100%      Co-Pay: none DME: 100%     Co-Pay: none Providers: in network  SECONDARY: Medicare Part A      Policy#: [id number]      Phone#:    The "Data Collection Information Summary" for patients in Inpatient Rehabilitation Facilities with attached "Privacy Act [place] Records" was provided and verbally reviewed with: N/A  Emergency Contact Information Contact Information    Name Relation Home Work Mobile   [place]  [phone]   [facility] Daughter   [phone]   [place], [place] Relative [phone]  [phone]   [place] Papa   [phone]     Current Medical History  Patient Admitting Diagnosis: Sepsis, CVA  History of Present Illness: Stacie [name] is a 64 year old right-handed female with history of arrhythmia, hypertension, asthma, hypothyroidism hard of hearing with hearing aids, pulmonary sarcoidosis on 2 L nasal cannula at baseline  maintained methotrexate as well as chronic prednisone, OSA, IBS, chronic kidney disease with creatinine 1.04-1.11.  Per chart review patient lives with spouse who is a Surveyor, quantity.  Two-level home bed and bath upstairs.  Independent with assistive  device.  Presented 07/09/2020 with altered mental status and shortness of breath.  Cranial CT scan showed chronic white matter ischemic changes without acute abnormality.  CT of the chest showed chronic changes of pulmonary sarcoidosis with biapical predominant pulmonary fibrosis.  Very mild superimposed active inflammatory infiltrate.  Ultrasound of the abdomen showed liver echogenicity overall increased in mildly inhomogeneous.  Appearance most likely indicate of of hepatic steatosis.  No focal liver lesions evident.  Admission chemistries BUN 37, creatinine 4.16, lactic acid 5.8, urinalysis negative nitrite, urine culture greater 100,000 Klebsiella, troponin 88, hemoglobin 12.2, WBC 32,900, blood cultures enterobacterales and Klebsiella.  Placed on broad-spectrum antibiotics suspect sepsis secondary to community-acquired pneumonia and UTI.  Renal service is consulted for AKI placed on lactated Ringer's.  Renal ultrasound showed no hydronephrosis.  Small volume right upper quadrant ascites.  No indications for hemodialysis.  Infectious disease consulted 07/13/2020 for Klebsiella bacteremia and maintained on ceftriaxone.  CT of abdomen completed to look for renal/ureteric stone that showed 7 to 8 mm proximal right ureteral stone with obstructive change and perinephric stranding.  Minimal free fluid within the abdomen and pelvis.  Urology consulted underwent cystoscopy as well as bilateral retrograde pyelogram with right and left ureteral stent placement 07/14/2020.  Patient would need  bilateral outpatient ureteroscopy, laser lithotripsy and stent exchange in 4 to 8 weeks.  Patient did remain intubated for a short time.  Neurology consulted 07/16/2020 for right side weakness and slurred speech.  MRI showed severely limited evaluation with multiple sequences.  Diffusion-weighted imaging significantly limited with multiple small areas of DWI hyperintensity in the bilateral white matter representing artifact versus small  infarcts.  CT angiogram of head and neck limited with no evidence of large vessel occlusion or significant arterial stenosis.  MRI later repeated 07/21/2020 unchanged DWI from prior imaging with small vague foci of trace diffusion abnormality suggesting scattered small foci of subacute white matter ischemia.  Underlying advanced but nonspecific chronic signal changes in the cerebral white matter in the pons redemonstrated most commonly due to small vessel disease.  EEG showed some background slowing with rare triphasic waves noted no seizure activity.  Echocardiogram with ejection fraction of 30 to 35% the left ventricle demonstrating global hypokinesis.  Echo bubble study no evidence of thrombus and grade 3 diastolic dysfunction.  Podiatry as well as vascular surgery consulted for necrosis dry gangrene of both feet left greater than right 07/20/2020 with ischemic changes.  Vascular surgery did not feel any CT angiogram or intervention was needed and advised to continue to follow.  HIT had been ruled out.  Currently maintained on subcutaneous heparin for DVT prophylaxis.  Complete NIHSS TOTAL: 4 Glasgow Coma Scale Score: 15  Past Medical History  Past Medical History:  Diagnosis Date  . Arrhythmia    patient unaware if this is current  . Asthma   . GERD (gastroesophageal reflux disease)   . Heart murmur   . History of kidney stones   . HOH (hard of hearing)    wear aids  . Hypothyroidism   . IBS (irritable bowel syndrome)   . Pulmonary hypertension ([place])   . Sarcoid   . Sarcoidosis   . Seasonal allergies   . Sleep apnea CPAP with O2  . Wears hearing aid in both ears    Family History  family history includes Allergies in her brother and father; Asthma in her brother and father; Breast cancer in her maternal grandmother; Colon cancer in her father.  Prior Rehab/Hospitalizations:  Has the patient had prior rehab or hospitalizations prior to admission? No  Has the patient had major surgery  during 100 days prior to admission? Yes  Current Medications   Current Facility-Administered Medications:  .  0.9 %  sodium chloride infusion, , Intravenous, Continuous, Teodoro Spray, MD, Last Rate: 300 mL/hr at 07/27/20 0813, New Bag at 07/27/20 0813 .  acetaminophen (TYLENOL) tablet 650 mg, 650 mg, Oral, Q4H PRN, Fritzi Mandes, MD, 650 mg at 07/26/20 0827 .  arformoterol (BROVANA) nebulizer solution 15 mcg, 15 mcg, Nebulization, BID, Tyler Pita, MD, 15 mcg at 07/28/20 0728 .  aspirin EC tablet 81 mg, 81 mg, Oral, Daily, [name], MD, 81 mg at 07/28/20 0954 .  atorvastatin (LIPITOR) tablet 40 mg, 40 mg, Oral, Daily, Tyler Pita, MD, 40 mg at 07/28/20 0959 .  bisacodyl (DULCOLAX) suppository 10 mg, 10 mg, Rectal, Daily PRN, Tukov-Yual, Magdalene S, NP .  budesonide (PULMICORT) nebulizer solution 0.25 mg, 0.25 mg, Nebulization, BID, Tyler Pita, MD, 0.25 mg at 07/28/20 0728 .  cefTRIAXone (ROCEPHIN) 2 g in sodium chloride 0.9 % 100 mL IVPB, 2 g, Intravenous, Q24H, Fritzi Mandes, MD .  Chlorhexidine Gluconate Cloth 2 % PADS 6 each, 6 each, Topical, Q0600, Tyler Pita,  MD, 6 each at 07/28/20 0725 .  docusate (COLACE) 50 MG/5ML liquid 100 mg, 100 mg, Oral, BID, Awilda Bill, NP, 100 mg at 07/28/20 0959 .  docusate sodium (COLACE) capsule 100 mg, 100 mg, Oral, BID PRN, Darel Hong D, NP, 100 mg at 07/21/20 2015 .  dorzolamide (TRUSOPT) 2 % ophthalmic solution 1 drop, 1 drop, Both Eyes, BID, Dallie Piles, RPH, 1 drop at 07/28/20 0959 .  escitalopram (LEXAPRO) tablet 20 mg, 20 mg, Oral, Daily, Dallie Piles, RPH, 20 mg at 07/28/20 0959 .  feeding supplement (ENSURE ENLIVE) (ENSURE ENLIVE) liquid 237 mL, 237 mL, Oral, TID BM, Tyler Pita, MD, 237 mL at 07/27/20 2147 .  fluconazole (DIFLUCAN) tablet 100 mg, 100 mg, Oral, Once, Fritzi Mandes, MD .  folic acid (FOLVITE) tablet 1 mg, 1 mg, Oral, Daily, Fritzi Mandes, MD .  heparin injection 5,000 Units,  5,000 Units, Subcutaneous, Q8H, [place], Huel Cote, NP, 5,000 Units at 07/28/20 0677 .  insulin aspart (novoLOG) injection 0-15 Units, 0-15 Units, Subcutaneous, TID AC & HS, Bradly Bienenstock, NP, 5 Units at 07/27/20 2144 .  latanoprost (XALATAN) 0.005 % ophthalmic solution 1 drop, 1 drop, Right Eye, Rozanna Box, RPH, 1 drop at 07/27/20 2144 .  levothyroxine (SYNTHROID) tablet 137 mcg, 137 mcg, Oral, Q0600, Dallie Piles, RPH, 137 mcg at 07/28/20 0340 .  methotrexate (RHEUMATREX) tablet 25 mg, 25 mg, Oral, Weekly, Fritzi Mandes, MD .  multivitamin with minerals tablet 1 tablet, 1 tablet, Oral, Daily, Tyler Pita, MD, 1 tablet at 07/28/20 0954 .  nystatin (MYCOSTATIN/NYSTOP) topical powder 1 application, 1 application, Topical, TID, Fritzi Mandes, MD, 1 application at 35/24/81 1000 .  pantoprazole (PROTONIX) EC tablet 40 mg, 40 mg, Oral, Daily, Fritzi Mandes, MD, 40 mg at 07/28/20 0954 .  polyethylene glycol (MIRALAX / GLYCOLAX) packet 17 g, 17 g, Oral, Daily PRN, Darel Hong D, NP, 17 g at 07/28/20 0959 .  [COMPLETED] predniSONE (DELTASONE) tablet 40 mg, 40 mg, Oral, Once, 40 mg at 07/24/20 1038 **FOLLOWED BY** [COMPLETED] predniSONE (DELTASONE) tablet 35 mg, 35 mg, Oral, Once, 35 mg at 07/25/20 1045 **FOLLOWED BY** [COMPLETED] predniSONE (DELTASONE) tablet 30 mg, 30 mg, Oral, Once, 30 mg at 07/26/20 0827 **FOLLOWED BY** [COMPLETED] predniSONE (DELTASONE) tablet 25 mg, 25 mg, Oral, Once, 25 mg at 07/27/20 1050 **FOLLOWED BY** [COMPLETED] predniSONE (DELTASONE) tablet 20 mg, 20 mg, Oral, Once, 20 mg at 07/28/20 1006 **FOLLOWED BY** [START ON 07/29/2020] predniSONE (DELTASONE) tablet 15 mg, 15 mg, Oral, Once **FOLLOWED BY** [START ON 07/30/2020] predniSONE (DELTASONE) tablet 15 mg, 15 mg, Oral, Q breakfast, Posey Pronto, Sona, MD .  sodium chloride flush (NS) 0.9 % injection 10-40 mL, 10-40 mL, Intracatheter, Q12H, Fritzi Mandes, MD, 10 mL at 07/28/20 1006 .  sodium chloride flush (NS) 0.9 %  injection 10-40 mL, 10-40 mL, Intracatheter, PRN, Fritzi Mandes, MD .  tamsulosin [facility]) capsule 0.4 mg, 0.4 mg, Oral, Daily, Fritzi Mandes, MD, 0.4 mg at 07/28/20 0956 .  thiamine tablet 100 mg, 100 mg, Oral, Daily, Lu Duffel, RPH, 100 mg at 07/28/20 0956 .  Treprostinil (TYVASO) inhalation solution 18 mcg, 18 mcg, Inhalation, QID, Dallie Piles, RPH, 18 mcg at 07/28/20 0732  Patients Current Diet:  Diet Order            Diet regular Room service appropriate? Yes; Fluid consistency: Thin  Diet effective now  Precautions / Restrictions Precautions Precautions: Fall Restrictions Weight Bearing Restrictions: No   Has the patient had 2 or more falls or a fall with injury in the past year?No  Prior Activity Level Community (5-7x/wk): pt. was active in the community PTA  Prior Functional Level Prior Function Level of Independence: Independent with assistive device(s) Comments: Indep with ADLs, household and limited community mobilization; home O2 at 2L.  Self Care: Did the patient need help bathing, dressing, using the toilet or eating?  Independent  Indoor Mobility: Did the patient need assistance with walking from room to room (with or without device)? Independent  Stairs: Did the patient need assistance with internal or external stairs (with or without device)? Independent  Functional Cognition: Did the patient need help planning regular tasks such as shopping or remembering to take medications? Needed some help  Home Assistive Devices / [place] Devices/Equipment: None  Prior Device Use: Indicate devices/aids used by the patient prior to current illness, exacerbation or injury? None of the above  Current Functional Level Cognition  Arousal/Alertness: Awake/alert Overall Cognitive Status: Impaired/Different from baseline Difficult to assess due to:  (Pt answers inconsistently) Orientation Level: Oriented X4 Following Commands:  Follows one step commands inconsistently, Follows one step commands with increased time Safety/Judgement: Decreased awareness of deficits, Decreased awareness of safety General Comments: Pt continues to require multimodal cues for all activities, with good carryover of single step commands with improved carryover of multi-step commands. Attention: Sustained Sustained Attention: Impaired Sustained Attention Impairment: Verbal basic, Functional basic Memory: Impaired Memory Impairment: Decreased recall of new information, Retrieval deficit, Decreased short term memory Decreased Short Term Memory: Verbal basic, Functional basic Awareness: Impaired Awareness Impairment: Intellectual impairment, Emergent impairment, Anticipatory impairment Problem Solving: Impaired Problem Solving Impairment: Verbal basic, Functional basic Executive Function:  (all impaired by lower level deficits) Behaviors: Impulsive    Extremity Assessment (includes Sensation/Coordination)  Upper Extremity Assessment: RUE deficits/detail, LUE deficits/detail RUE Deficits / Details: Grip 3-/5. Biceps 3-/5. Triceps 3-/5.  Deltoids 2/5. PROM WFL. LUE Deficits / Details: Grip 4/5. Biceps 3+/5. Triceps 3+/5.  Deltoids 3+/5. PROM WFL.  Lower Extremity Assessment: RLE deficits/detail, LLE deficits/detail RLE Deficits / Details: R hip flexion 2-/5, hip ext 3-/5, knee flex 2-/5, knee ext 3-/5, ankle DF 1/5, ankle PF 2-/5; denies sensory deficit.  Ischemic changes R great toe. LLE Deficits / Details: R hip flexion 3-/5, hip ext 3-/5, knee flex 3-/5, knee ext 3-/5, ankle DF 2-/5, ankle PF 3-/5; denies sensory deficit.  Ischemic changes L mid-foot distally    ADLs  Overall ADL's : Needs assistance/impaired General ADL Comments: OTR provided max/hand over hand assist for grooming/feeding tasks at bedlevel.  Pt remains total assist for lower body dressing and toileting at bedlevel.    Mobility  Overal bed mobility: Needs Assistance  Bed Mobility: Supine to Sit, Sit to Supine Rolling: Min guard Supine to sit: HOB elevated, Mod assist Sit to supine: Max assist, +2 for physical assistance General bed mobility comments: Mod A for supine to sit for scooting hips toward EOB and for completion of full upright truncal posiiton; max+2 A for trunk management and BLE elevation onto bed secondary to increased fatigue post activities    Transfers  Overall transfer level: Needs assistance Equipment used: Rolling walker (2 wheeled), None Transfers: Sit to/from Stand Sit to Stand: Max assist, +2 physical assistance General transfer comment: 4 attempts made today: first two with RW and required max+2 a for sit <> stand; pt able to clear hips  however unable to come into full upright stance requiring seated rest; 3rd and 4th attempts without RW with max+2 A with bilateral knee blocking    Ambulation / Gait / Stairs / Wheelchair Mobility  Ambulation/Gait General Gait Details: not safe to attempt at this time    Posture / Balance Dynamic Sitting Balance Sitting balance - Comments: SBA to CGA for static sitting balance; pt tends to lean elbows forward on knees when fatigued Balance Overall balance assessment: Needs assistance Sitting-balance support: Bilateral upper extremity supported, Feet supported Sitting balance-Leahy Scale: Fair Sitting balance - Comments: SBA to CGA for static sitting balance; pt tends to lean elbows forward on knees when fatigued Postural control: Posterior lean, Other (comment) (anterior lean) Standing balance support: Bilateral upper extremity supported Standing balance-Leahy Scale: Poor Standing balance comment: max+2 A for standing balance to remain upright; bilateral knee blocking required    Special needs/care consideration Oxygen 2L , Skin: Abrasion to R hip, Ecchymosis to bilateral feet, Petechiae on Pt.'s back, Rash on perineum     Previous Home Environment (from acute therapy documentation) Living  Arrangements: Spouse/significant other  Lives With: Spouse Available Help at Discharge: Family Type of Home: House Home Layout: Two level, Able to live on main level with bedroom/bathroom Alternate Level Stairs-Rails: Left Alternate Level Stairs-Number of Steps: 15 (15) Home Access: Stairs to enter Entrance Stairs-Rails: None Entrance Stairs-Number of Steps: 2 (2) Bathroom Shower/Tub: Tub/shower unit, Multimedia programmer: Programmer, systems: Yes How Accessible: Accessible via wheelchair, Accessible via walker [place]: No Additional Comments: Patient limited historian; will verify with family as available  Discharge Living Setting Plans for Discharge Living Setting: Patient's home Type of Home at Discharge: House Discharge Home Layout: Two level, Full bath on main level Alternate Level Stairs-Rails: Left Alternate Level Stairs-Number of Steps: 15 Discharge Home Access: Stairs to enter Entrance Stairs-Rails: None Entrance Stairs-Number of Steps: 2 Discharge Bathroom Shower/Tub: Tub/shower unit, Horticulturist, commercial: Standard Discharge Bathroom Accessibility: Yes How Accessible: Accessible via wheelchair, Accessible via walker Does the patient have any problems obtaining your medications?: No  Social/Family/Support Systems Patient Roles: Other (Comment) Contact Information: [phone] Anticipated Caregiver: Marionette Meskill (daughter) Anticipated Caregiver's Contact Information: [phone] Ability/Limitations of Caregiver: none  Caregiver Availability: 24/7 Discharge Plan Discussed with Primary Caregiver: Yes Is Caregiver In Agreement with Plan?: Yes Does Caregiver/Family have Issues with Lodging/Transportation while Pt is in Rehab?: No   Goals Patient/Family Goal for Rehab: PT/OT/SLP Min A Expected length of stay: 18-21 days Pt/Family Agrees to Admission and willing to participate: Yes Program Orientation Provided  & Reviewed with Pt/Caregiver Including Roles  & Responsibilities: Yes   Decrease burden of Care through IP rehab admission: Specialzed equipment needs, Decrease number of caregivers, Bowel and bladder program and Patient/family education   Possible need for SNF placement upon discharge:not anticipated   Patient Condition: This patient's medical and functional status has changed since the consult dated: 07/21/2020 in which the Rehabilitation Physician determined and documented that the patient's condition is appropriate for intensive rehabilitative care in an inpatient rehabilitation facility. See "History of Present Illness" (above) for medical update. Functional changes are: improvements in cognition and resolution of dysphagia. Pt continues to require max+2 assist for ADLs and transfers. Patient's medical and functional status update has been discussed with the Rehabilitation physician and patient remains appropriate for inpatient rehabilitation. Will admit to inpatient rehab today.  Preadmission Screen Completed By:  Genella Mech, CCC-SLP, 07/28/2020 10:14 AM ______________________________________________________________________   Discussed status  with Dr. Ranell Patrick on 07/28/2020 at 58 and received approval for admission today.  Admission Coordinator:  Genella Mech, tim1015/Date10/09/2020

## 2020-07-20 ENCOUNTER — Inpatient Hospital Stay: Payer: BC Managed Care – PPO

## 2020-07-20 DIAGNOSIS — I998 Other disorder of circulatory system: Secondary | ICD-10-CM

## 2020-07-20 DIAGNOSIS — I6389 Other cerebral infarction: Secondary | ICD-10-CM

## 2020-07-20 LAB — CBC WITH DIFFERENTIAL/PLATELET
Abs Immature Granulocytes: 0.33 10*3/uL — ABNORMAL HIGH (ref 0.00–0.07)
Basophils Absolute: 0.1 10*3/uL (ref 0.0–0.1)
Basophils Relative: 1 %
Eosinophils Absolute: 0.1 10*3/uL (ref 0.0–0.5)
Eosinophils Relative: 1 %
HCT: 45 % (ref 36.0–46.0)
Hemoglobin: 14.4 g/dL (ref 12.0–15.0)
Immature Granulocytes: 2 %
Lymphocytes Relative: 4 %
Lymphs Abs: 0.8 10*3/uL (ref 0.7–4.0)
MCH: 30.8 pg (ref 26.0–34.0)
MCHC: 32 g/dL (ref 30.0–36.0)
MCV: 96.4 fL (ref 80.0–100.0)
Monocytes Absolute: 2 10*3/uL — ABNORMAL HIGH (ref 0.1–1.0)
Monocytes Relative: 9 %
Neutro Abs: 18.9 10*3/uL — ABNORMAL HIGH (ref 1.7–7.7)
Neutrophils Relative %: 83 %
Platelets: 139 10*3/uL — ABNORMAL LOW (ref 150–400)
RBC: 4.67 MIL/uL (ref 3.87–5.11)
RDW: 14 % (ref 11.5–15.5)
WBC: 22.3 10*3/uL — ABNORMAL HIGH (ref 4.0–10.5)
nRBC: 0 % (ref 0.0–0.2)

## 2020-07-20 LAB — BASIC METABOLIC PANEL
Anion gap: 12 (ref 5–15)
BUN: 40 mg/dL — ABNORMAL HIGH (ref 8–23)
CO2: 32 mmol/L (ref 22–32)
Calcium: 8 mg/dL — ABNORMAL LOW (ref 8.9–10.3)
Chloride: 104 mmol/L (ref 98–111)
Creatinine, Ser: 1.19 mg/dL — ABNORMAL HIGH (ref 0.44–1.00)
GFR calc Af Amer: 55 mL/min — ABNORMAL LOW (ref >60)
GFR calc non Af Amer: 48 mL/min — ABNORMAL LOW (ref >60)
Glucose, Bld: 124 mg/dL — ABNORMAL HIGH (ref 70–99)
Potassium: 4 mmol/L (ref 3.5–5.1)
Sodium: 148 mmol/L — ABNORMAL HIGH (ref 135–145)

## 2020-07-20 LAB — GLUCOSE, CAPILLARY
Glucose-Capillary: 104 mg/dL — ABNORMAL HIGH (ref 70–99)
Glucose-Capillary: 115 mg/dL — ABNORMAL HIGH (ref 70–99)
Glucose-Capillary: 139 mg/dL — ABNORMAL HIGH (ref 70–99)
Glucose-Capillary: 169 mg/dL — ABNORMAL HIGH (ref 70–99)
Glucose-Capillary: 177 mg/dL — ABNORMAL HIGH (ref 70–99)
Glucose-Capillary: 206 mg/dL — ABNORMAL HIGH (ref 70–99)

## 2020-07-20 LAB — MAGNESIUM: Magnesium: 2.2 mg/dL (ref 1.7–2.4)

## 2020-07-20 LAB — PHOSPHORUS: Phosphorus: 3.2 mg/dL (ref 2.5–4.6)

## 2020-07-20 MED ORDER — ADULT MULTIVITAMIN W/MINERALS CH
1.0000 | ORAL_TABLET | Freq: Every day | ORAL | Status: DC
  Administered 2020-07-21 – 2020-07-28 (×8): 1 via ORAL
  Filled 2020-07-20 (×8): qty 1

## 2020-07-20 MED ORDER — LEVOTHYROXINE SODIUM 137 MCG PO TABS
137.0000 ug | ORAL_TABLET | Freq: Every day | ORAL | Status: DC
  Administered 2020-07-21 – 2020-07-28 (×6): 137 ug via ORAL
  Filled 2020-07-20 (×9): qty 1

## 2020-07-20 MED ORDER — ENSURE ENLIVE PO LIQD
237.0000 mL | Freq: Three times a day (TID) | ORAL | Status: DC
  Administered 2020-07-20 – 2020-07-27 (×12): 237 mL via ORAL

## 2020-07-20 MED ORDER — ESCITALOPRAM OXALATE 10 MG PO TABS
20.0000 mg | ORAL_TABLET | Freq: Every day | ORAL | Status: DC
  Administered 2020-07-20 – 2020-07-28 (×9): 20 mg via ORAL
  Filled 2020-07-20 (×10): qty 2

## 2020-07-20 MED ORDER — DORZOLAMIDE HCL 2 % OP SOLN
1.0000 [drp] | Freq: Two times a day (BID) | OPHTHALMIC | Status: DC
  Administered 2020-07-20 – 2020-07-28 (×16): 1 [drp] via OPHTHALMIC
  Filled 2020-07-20: qty 10

## 2020-07-20 MED ORDER — LATANOPROST 0.005 % OP SOLN
1.0000 [drp] | Freq: Every day | OPHTHALMIC | Status: DC
  Administered 2020-07-20 – 2020-07-27 (×7): 1 [drp] via OPHTHALMIC
  Filled 2020-07-20 (×2): qty 2.5

## 2020-07-20 MED ORDER — DEXTROSE 5 % IV SOLN: INTRAVENOUS | Status: DC

## 2020-07-20 MED ORDER — ATORVASTATIN CALCIUM 20 MG PO TABS
40.0000 mg | ORAL_TABLET | Freq: Every day | ORAL | Status: DC
  Administered 2020-07-20 – 2020-07-28 (×9): 40 mg via ORAL
  Filled 2020-07-20 (×9): qty 2

## 2020-07-20 NOTE — Progress Notes (Signed)
Stacie [name], [place] 07/20/20  Subjective:   Hospital day # 10  Good UOP noted.  UOP 2.9 liters over preceding 24 hours.  Doing better overall.  Cr 1.1.   Renal: 10/03 0701 - 10/04 0700 In: -  Out: 2925 [Urine:2925]   Objective:  Vital signs in last 24 hours:  Temp:  [97.5 F (36.4 C)-98.8 F (37.1 C)] 98.7 F (37.1 C) (10/04 0400) Pulse Rate:  [86-99] 95 (10/04 0600) Resp:  [19-29] 28 (10/04 0600) BP: (106-140)/(56-78) 129/74 (10/04 0600) SpO2:  [95 %-100 %] 100 % (10/04 0600)  Weight change:  Filed Weights   07/15/20 0432 07/16/20 0500 07/19/20 0500  Weight: 88.7 kg 87.5 kg 86.3 kg    Intake/Output:    Intake/Output Summary (Last 24 hours) at 07/20/2020 0829 Last data filed at 07/20/2020 0500 Gross per 24 hour  Intake --  Output 2925 ml  Net -2925 ml     Physical Exam: General:  No acute distress  HEENT  Normocephalic,atraumatic, hearing intact  Pulm/lungs  mild bilateral wheezing, normal effort  CVS/Heart  S1S2 + regular  Abdomen:   Soft, nondistended  Extremities:  No peripheral edema  Neurologic:  Awake, alert, follows commands  Skin:  Dusky appearance to both feet    Basic Metabolic Panel:  Recent Labs  Lab 07/16/20 0706 07/16/20 0706 07/16/20 0930 07/16/20 0930 07/16/20 1329 07/16/20 2122 07/17/20 0314 07/17/20 0314 07/18/20 0424 07/19/20 0530 07/20/20 0415  NA 156*   < > 155*  --    < > 154* 154*  --  146* 145 148*  K 4.4   < > 4.3  --   --   --  4.4  --  4.3 3.8 4.0  CL 115*   < > 114*  --   --   --  109  --  103 102 104  CO2 31   < > 31  --   --   --  31  --  28 33* 32  GLUCOSE 230*   < > 237*  --   --   --  234*  --  194* 148* 124*  BUN 76*   < > 77*  --   --   --  60*  --  51* 41* 40*  CREATININE 1.92*   < > 1.85*  --   --   --  1.49*  --  1.32* 1.13* 1.19*  CALCIUM 7.9*   < > 8.1*   < >  --   --  7.9*   < > 7.9* 7.5* 8.0*  MG 2.2  --   --   --   --   --  1.9  --  1.9 1.7 2.2  PHOS 3.2  --   --   --    --   --  2.4*  --  2.7 2.7 3.2   < > = values in this interval not displayed.     CBC: Recent Labs  Lab 07/16/20 0706 07/17/20 0314 07/18/20 0424 07/19/20 0530 07/20/20 0415  WBC 19.6* 26.0* 22.9* 26.1* 22.3*  NEUTROABS 16.0* 22.2* 19.3* 22.2* 18.9*  HGB 13.1 13.8 13.8 13.6 14.4  HCT 42.8 45.7 45.3 43.9 45.0  MCV 100.2* 100.9* 101.1* 100.2* 96.4  PLT 77* 76* 83* 97* 139*      Lab Results  Component Value Date   HEPBSAG NON REACTIVE 07/15/2020   HEPBIGM NON REACTIVE 07/15/2020      Microbiology:  Recent Results (from the  past 240 hour(s))  MRSA PCR Screening     Status: None   Collection Time: 07/10/20 12:54 PM   Specimen: Nasal Mucosa; Nasopharyngeal  Result Value Ref Range Status   MRSA by PCR NEGATIVE NEGATIVE Final    Comment:        The GeneXpert MRSA Assay (FDA approved for NASAL specimens only), is one component of a comprehensive MRSA colonization surveillance program. It is not intended to diagnose MRSA infection nor to guide or monitor treatment for MRSA infections. Performed at [facility], [address] [place], [place] [address]   Culture, blood (Routine X 2) w Reflex to ID Panel     Status: None (Preliminary result)   Collection Time: 07/16/20  1:29 PM   Specimen: BLOOD  Result Value Ref Range Status   Specimen Description BLOOD LEFT ANTECUBITAL  Final   Special Requests   Final    AEROBIC BOTTLE ONLY Blood Culture results may not be optimal due to an inadequate volume of blood received in culture bottles   Culture   Final    NO GROWTH 4 DAYS Performed at [facility], [address], [place], [place] [address]    Report Status PENDING  Incomplete  Culture, blood (Routine X 2) w Reflex to ID Panel     Status: None (Preliminary result)   Collection Time: 07/16/20  5:09 PM   Specimen: BLOOD  Result Value Ref Range Status   Specimen Description BLOOD BLOOD RIGHT HAND  Final   Special Requests   Final    BOTTLES  DRAWN AEROBIC ONLY Blood Culture results may not be optimal due to an inadequate volume of blood received in culture bottles   Culture   Final    NO GROWTH 4 DAYS Performed at [facility], [address] [place], [place] [address]    Report Status PENDING  Incomplete    Coagulation Studies: No results for input(s): LABPROT, INR in the last 72 hours.  Urinalysis: No results for input(s): COLORURINE, LABSPEC, PHURINE, GLUCOSEU, HGBUR, BILIRUBINUR, KETONESUR, PROTEINUR, UROBILINOGEN, NITRITE, LEUKOCYTESUR in the last 72 hours.  Invalid input(s): APPERANCEUR    Imaging: US Carotid Bilateral  Result Date: 07/19/2020 CLINICAL DATA:  64 year old female with stroke EXAM: BILATERAL CAROTID DUPLEX ULTRASOUND TECHNIQUE: Pearline Cables scale imaging, color Doppler and duplex ultrasound were performed of bilateral carotid and vertebral arteries in the neck. COMPARISON:  None. FINDINGS: Criteria: Quantification of carotid stenosis is based on velocity parameters that correlate the residual internal carotid diameter with NASCET-based stenosis levels, using the diameter of the distal internal carotid lumen as the denominator for stenosis measurement. The following velocity measurements were obtained: RIGHT ICA:  Systolic 82 cm/sec, Diastolic 22 cm/sec CCA:  56 cm/sec SYSTOLIC ICA/CCA RATIO:  1.5 ECA:  80 cm/sec LEFT ICA:  Systolic 419 cm/sec, Diastolic 31 cm/sec CCA:  69 cm/sec SYSTOLIC ICA/CCA RATIO:  1.5 ECA:  67 cm/sec Right Brachial SBP: Not acquired Left Brachial SBP: Not acquired RIGHT CAROTID ARTERY: No significant calcifications of the right common carotid artery. Intermediate waveform maintained. Heterogeneous and partially calcified plaque at the right carotid bifurcation. No significant lumen shadowing. Low resistance waveform of the right ICA. No significant tortuosity. RIGHT VERTEBRAL ARTERY: Antegrade flow with low resistance waveform. LEFT CAROTID ARTERY: No significant calcifications of the  left common carotid artery. Intermediate waveform maintained. Heterogeneous and partially calcified plaque at the left carotid bifurcation without significant lumen shadowing. Low resistance waveform of the left ICA. No significant tortuosity. LEFT VERTEBRAL ARTERY:  Antegrade flow with low resistance waveform. IMPRESSION: Color duplex indicates minimal heterogeneous and calcified plaque, with no hemodynamically significant stenosis by duplex criteria in the extracranial cerebrovascular circulation. Signed, Dulcy Fanny. Dellia Nims, RPVI [facility] Radiology Electronically Signed   By: Corrie Mckusick D.O.   On: 07/19/2020 05:40   DG Chest Port 1 View  Result Date: 07/20/2020 CLINICAL DATA:  Acute respiratory failure. EXAM: PORTABLE CHEST 1 VIEW COMPARISON:  07/14/2020 and CT chest 07/10/2020. FINDINGS: Patient is rotated. Trachea is midline. Heart is enlarged, stable. Upper lobe predominant coarsened pulmonary parenchymal markings with relatively low lung volumes. Prominent epicardial fat versus small left pleural effusion. Thoracic vertebral body augmentations. IMPRESSION: 1. Pulmonary parenchymal findings of sarcoid without superimposed acute findings. 2. Prominent epicardial fat versus small left pleural effusion. Electronically Signed   By: Lorin Picket M.D.   On: 07/20/2020 07:47     Medications:   . cefTRIAXone (ROCEPHIN)  IV 2 g (07/19/20 1739)  . dextrose     . arformoterol  15 mcg Nebulization BID  . aspirin EC  81 mg Oral Daily  . budesonide (PULMICORT) nebulizer solution  0.25 mg Nebulization BID  . chlorhexidine gluconate (MEDLINE KIT)  15 mL Mouth Rinse BID  . Chlorhexidine Gluconate Cloth  6 each Topical Q0600  . docusate  100 mg Oral BID  . insulin aspart  0-15 Units Subcutaneous Q4H  . methylPREDNISolone (SOLU-MEDROL) injection  20 mg Intravenous Daily  . pantoprazole (PROTONIX) IV  40 mg Intravenous QHS  . polyethylene glycol  17 g  Oral Daily  . thiamine injection  100 mg Intravenous Daily  . Treprostinil  18 mcg Inhalation QID   acetaminophen, bisacodyl, docusate sodium, polyethylene glycol  Assessment/ Plan:  64 y.o. female with  sarcoidosis, interstitial lung disease, obstructive sleep apnea, depression, GERD, hyperlipidemia, hypothyroidism   admitted on 07/09/2020 for Elevated LFTs [R79.89] Sepsis ([place]) [A41.9] Sepsis, due to unspecified organism, unspecified whether acute organ dysfunction present [facility]) [A41.9]  # AKI Baseline Cr 1.0 in May 2021 Admi Cr 4.16, peaked at 4.57 -Renal function continues to improve, Cr down to 1.1,  Continue D5W.  Lab Results  Component Value Date   CREATININE 1.19 (H) 07/20/2020   CREATININE 1.13 (H) 07/19/2020   CREATININE 1.32 (H) 07/18/2020     #Sepsis from urinary source Klebsiella pneumonia and blood in urine Patient has significant history of renal stones.  History of left ureteroscopy May 2021  CT Abd/Pelvis on  07/14/20 IMPRESSION: 7-8 mm proximal right ureteral stone with obstructive change and perinephric stranding. Urology placed ureteral stents bilaterally on 07/14/20  #History of pulmonary sarcoidosis, pulmonary hypertension Acute on chronic respiratory failure, community-acquired pneumonia  requires home oxygen and CPAP Was intubated and mechanically ventilated - now extubated -Remains on nasal canula.   # Hypernatremia Sodium higher today, increase D5W to 75cc/hr.     LOS: 10 Stacie [name] 10/4/20218:29 AM  [place], [place]  Note: This note was prepared with Dragon dictation. Any transcription errors are unintentional

## 2020-07-20 NOTE — Progress Notes (Signed)
Speech Language Pathology Treatment: Dysphagia  Patient Details Name: Stacie [name] MRN: [id number] DOB: Aug 08, 1956 Today's Date: 07/20/2020 Time: 1207-1227 SLP Time Calculation (min) (ACUTE ONLY): 20 min  Assessment / Plan / Recommendation Clinical Impression  Skilled treatment session targeted pt's dysphagia goals. SLP facilitiated session by providing skilled observation of pt consuming dysphagia 2 items and thin liquids via straw. Pt with vast improvement over previous session. As such, she is able to appropriately verbally interact, recognizes family and some staff, follow directions and is even joking. Pt demonstrated effective mastication of soft solids with complete oral clearing. When consuming larger boluses, pt O2 sats dropped briefly into the low 80's but with cued breathing, O2 rebounded nicely. When consuming thin liquids via straw, pt was free of any overt s/s of aspiration and her swallow initiation appeared swift with O2 stable.   There recommend upgrading pt's diet to dysphagia 2 with thin liquids (my use straw), medicine whole in puree and STRICT ASPIRATION PRECAUTIONS. Education provided to pt, her husband, her daughter and MD/nurse.    HPI HPI: 64 y.o. Female admitted with Sepsis and Acute on Chronic Hypoxic Respiratory Failure in the setting of Community Acquired Pneumonia and Acute Exacerbation of Pulmonary Sarcoidosis requiring BiPAP. Pt also with UTI and AKI. Pt currently altered but has progressed to 2L nasal cannula.       SLP Plan  Continue with current plan of care       Recommendations  Diet recommendations: Dysphagia 2 (fine chop);Thin liquid Liquids provided via: Straw Medication Administration: Whole meds with puree Supervision: Full supervision/cueing for compensatory strategies;Staff to assist with self feeding Compensations: Minimize environmental distractions;Slow rate;Small sips/bites Postural Changes and/or Swallow Maneuvers: Seated upright 90  degrees                General recommendations: Rehab consult Oral Care Recommendations: Oral care BID Follow up Recommendations: Inpatient Rehab SLP Visit Diagnosis: Dysphagia, oropharyngeal phase (R13.12) Plan: Continue with current plan of care       GO               Cliffton Spradley B. Dreama Saa M.S., CCC-SLP, [facility] Speech-Language Pathologist Rehabilitation Services Office [phone]  Stacie [name] 07/20/2020, 3:26 PM

## 2020-07-20 NOTE — Progress Notes (Signed)
Nutrition Follow-up  DOCUMENTATION CODES:   Not applicable  INTERVENTION:  Provide Ensure Enlive po TID, each supplement provides 350 kcal and 20 grams of protein.  Provide double protein at meals.  Provide MVI po daily.  Monitor magnesium, potassium, and phosphorus daily for at least 3 days, MD to replete as needed, as pt is at risk for refeeding syndrome given prolonged NPO status.  NUTRITION DIAGNOSIS:   Inadequate oral intake related to inability to eat as evidenced by NPO status.  Resolving - diet has been advanced and PO intake is improving.  GOAL:   Patient will meet greater than or equal to 90% of their needs  Progressing.  MONITOR:   Diet advancement, Labs, Weight trends, I & O's  REASON FOR ASSESSMENT:   NPO/Clear Liquid Diet    ASSESSMENT:   64 year old female with PMHx of sarcoidosis, asthma, sleep apnea, IBS, hypothyroidism, GERD admitted with sepsis, acute on chronic hypoxic respiratory failure int he setting of community acquired PNA and acute exacerbation of pulmonary sarcoidosis requiring BiPAP, also with UTI, AKI, AMS.  10/2 diet advanced to dysphagia 1 with nectar-thick liquids 10/4 diet advanced to dysphagia 2 with thin liquids  Received call from kitchen this AM. Patient has an allergy documented to corn-containing products (severity not specified) with a reaction of diarrhea and headaches. The kitchen is unable to send her any thickened liquids except for thickened water as the other products all contain cornstarch as the thickener.   Met with patient at bedside. She reports it is fine if she receives corn products and it is an intolerance and not a true allergy. Per MD and NP also present in room they are getting the same problem with D5 containing corn derivatives and report patient has tolerated well so far. However, unable to order some things such as [employer] as it is completely removed from options available.  Patient reports she was  tolerating dysphagia 1 diet well and was starting to eat more at meals. She was asking when she could be re-assessed to advanced to thin liquids as she does not like the thickened liquids. Diet has now been advanced to dysphagia 2 with thin liquids. Patient is amenable to drinking oral nutrition supplements with meals.  Medications reviewed and include: Colace 100 mg BID, Novolog 0-15 units Q4hrs, levothyroxine, Solu-Medrol 20 mg daily IV, Protonix, Miralax, thiamine 100 mg daily, ceftriaxone, D5W at 75 mL/hr.  Labs reviewed: CBG 104-169, Sodium 148, BUN 40, Creatinine 1.19.  Discussed with RN.  Diet Order:   Diet Order            DIET DYS 2 Room service appropriate? Yes; Fluid consistency: Thin  Diet effective now                EDUCATION NEEDS:   No education needs have been identified at this time  Skin:  Skin Assessment: Reviewed RN Assessment  Last BM:  PTA  Height:   Ht Readings from Last 1 Encounters:  07/14/20 5' 7.01" (1.702 m)   Weight:   Wt Readings from Last 1 Encounters:  07/19/20 86.3 kg   BMI:  Body mass index is 29.79 kg/m.  Estimated Nutritional Needs:   Kcal:  2050-2250  Protein:  105-115 grams  Fluid:  >/= 2 L/day  Jacklynn Barnacle, MS, RD, LDN Pager number available on Amion

## 2020-07-20 NOTE — Progress Notes (Addendum)
NAME:  Stacie [name], MRN:  [id number], DOB:  1955-10-25, LOS: 16 ADMISSION DATE:  07/09/2020, CONSULTATION DATE:  07/10/2020 REFERRING MD:  Dr. Owens Shark, CHIEF COMPLAINT:  AMS & shortness of breath  Brief History   64 y.o. Female admitted with Sepsis and Acute on Chronic Hypoxic Respiratory Failure in the setting of Community Acquired Pneumonia and Acute Exacerbation of Pulmonary Sarcoidosis requiring BiPAP.   Past Medical History  OSA- home CPAP Sarcoidosis Pulmonary HTN IBS Hypothyroidism GERD Asthma Arrhythmia  Significant Hospital Events   9/23: Presented to ED, placed on BiPAP 9/24: PCCM asked to admit 9/25: Klebsiella pneumoniae bacteremia due to UTI, associated encephalopathy 9/26: Persistent encephalopathy 9/27: Oozing blood from peripheral veins and both LE toes are with ischemic changes.  9/28- LFTs abnormal: [place] RUQ, ID consulted 07/15/20- hypernatremic >150, will start D5w, Extubated 07/16/20- Code stroke called d/t dysrarthria, right hemiparesis and right facial drooping. MRI/CTA brain and EEG.  07/17/20- Aphasia/right sided weakness. Failed swallow. Neuro following 07/18/20- Repeat swallow evaluation- dysphagia diet instituted 07/19/20- Improved articulation, able to participate with PT. 07/20/20- Vascular & podiatry consulted due to worsening ischemic changes in bilateral feet. Repeat MRI  Consults:  Nephrology Neurology Infectious Disease Podiatry Vascular  Procedures:   9/24 ETT >> 9/29   Significant Diagnostic Tests:  9/24: CT Head>>Chronic white matter ischemic changes without acute abnormality 9/24: CT Chest w/o Contrast>>Chronic changes of pulmonary sarcoidosis with biapical predominant pulmonary fibrosis. Very mild superimposed active inflammatory infiltrate. Morphologic changes compatible with pulmonary arterial hypertension. 9/30: Echo bubble study >> No evidence of thrombus, LVEF is 35 to 40%. Left ventricle has mild to moderately decreased function and  global hypokinesis. Left ventricular diastolic parameters are consistent with Grade III diastolic dysfunction (restrictive). 9/30: EEG >> General background slowing with rare triphasic waves noted.  This finding may be seen with a diffuse cerebral disturbance that is etiologically nonspecific, but may include a metabolic encephalopathy, among other possibilities.  No epileptiform activity was noted.   10/4 MRI w/o contrast >> Micro Data:  COVID-19 9/23 >> negative Influenza A/B 9/23 >> negative BC 9/23 >> positive (Enterobacterales & Klebsiella) MRSA PCR 9/24 >> negative UC 9/23 >> positive (Klebsiella) Aspergillus BAL 9/24 >> negative BC 9/30 >> negative  Antimicrobials:  9/23 Azithromycin x 1 dose 9/23 Ceftazidime x 1 dose 9/24 Ceftriaxone >>  Interim history/subjective:  Patient alert, but says she is "very tired" and saying her belly hurts "a little, maybe from the stones"  Overnight- D5W discontinued due to hyperglycemia.  Na+ 148 10/4.  No other concerns per care RN  Labs/Imaging personally reviewed TMAX 37.1 / WBC: 22.3 (improved) Net - 1.6 L CXR 10/4: findings consistent with sarcoidosis without superimposed acute process Objective   Blood pressure 129/74, pulse 95, temperature 98.7 F (37.1 C), temperature source Oral, resp. rate (!) 28, height 5' 7.01" (1.702 m), weight 86.3 kg, SpO2 100 %.        Intake/Output Summary (Last 24 hours) at 07/20/2020 0815 Last data filed at 07/20/2020 0500 Gross per 24 hour  Intake --  Output 2925 ml  Net -2925 ml   Filed Weights   07/15/20 0432 07/16/20 0500 07/19/20 0500  Weight: 88.7 kg 87.5 kg 86.3 kg    Examination: General: Adult female, lying in bed, NAD HEENT: MM pink/dry, anicteric Neuro: Alert, delayed responses, disoriented to place only, PERRL +3,  MAE- weak throughout CV: s1s2, RRR, ST on monitor, pulses +2/+1- R/P, no murmurs/rubs/gallops Pulm: regular, non labored on room air, breath sounds  BUL/BLL: clear/  diminished GI: rounded, soft to palpation with slight tenderness, bs x 4 GU: foley remains in place Skin: warm/dry, scattered ecchymosis, no rashes/lesions noted, see below for bilateral feet Extremities: Bilateral feet mottled/purple L>R, with second & third digit on L foot blackened, pulses palpable, able to wiggle toes and move feet   Resolved Hospital Problem list     Assessment & Plan:  Acute on Chronic Hypoxic Respiratory Failure secondary to acute Exacerbation of Pulmonary Sarcoidosis/ALI  - Supplemental O2 PRN, maintain SpO2 > 94% - intermittent CXR - Continue Ceftriaxone per ID recommendations, appreciate input - Continue Solumedrol 20 mg QD - Continue LABA/ICS: Arformoterol & Budesonide - Continue Treprostinil - able to tolerate 10/4 - Continue home CPAP QHS  Acute metabolic encephalopathy-resolved Due to severe sepsis/gram-negative bacteremia- klebsiella.  Present on admission d/t UTI  - continue solumedrol 20 mg QD - continue Thiamine - T4: 0.28, TSH low, Synthroid started   Acute CVA 9/30 Code stroke called d/t dysrarthria, right hemiparesis and right facial drooping. MRI/CTA brain and EEG. 10/4- repeat MRI pending  - f/u MRI pending - minimal R sided hemiparesis with minimal aphasia, both improving - neurology consulted, appreciate input - PT/OT working with pt., mobilize as tolerated - Continue aspirin - Start Atorvastatin, lipid panel ordered  - Continue Dysphagia diet per SLP receommendations  Severe sepsis secondary to [place] pneumoniae bactermia-improved  UTI, recurrent  - trend WBC/fever curve - Continue Ceftriaxone,  consider discontinuing 10/5 once 10 day course is complete  Obstructing calculi of right kidney and nonobstructing calculi of left ureter.  S/p surgical decompression with stent placement 9/28. Repeat CTabd/pelvis - no concerning findings to elucidate reason for worsening leukocytosis-07/17/20  - f/u with Urology outpatient  for ureteroscopy, laser lithotripsy and stent exchange  - consider   AKI -significantly improved -07/19/20 Anion Gap Metabolic Acidosis, corrected  - D5w restarted at 50 mL/h (down from 100 mL/h due to overnight hyperglycemia) d/t Na+: 148 - Strict I/O's - Daily BMET- monitor Na+ - avoid nephrotoxic agents, ensure adequate renal perfusion - Replace electrolytes PRN - Nephrology consulted, appreciate input  Ischemia- Bilateral lower extremities Necrotic Left 2nd, 3rd toes with mottling discoloration on bilateral feet. Bilateral pedal pulses intact- 10/4  - Regular neurovascular checks, alert MD if pulses are lost - Podiatry & Vascular surgery consulted, appreciate input  Transaminitis- improving Abdominal US showed liver echogenicity indicative of hepatic steatosis  - Trend LFT's  Best practice:  Diet: DYS diet- per SLP Pain/Anxiety/Delirium protocol (if indicated): N/A VAP protocol (if indicated): N/A DVT prophylaxis: SCD's & ASA GI prophylaxis: protonix Glucose control: Q 4 SSI, target range 140-180 Mobility: mobilize as tolerated- PT following Code Status: FULL Family Communication: Updated daughter and husband bedside- 10/4 Disposition: SDU  Labs   CBC: Recent Labs  Lab 07/16/20 0706 07/17/20 0314 07/18/20 0424 07/19/20 0530 07/20/20 0415  WBC 19.6* 26.0* 22.9* 26.1* 22.3*  NEUTROABS 16.0* 22.2* 19.3* 22.2* 18.9*  HGB 13.1 13.8 13.8 13.6 14.4  HCT 42.8 45.7 45.3 43.9 45.0  MCV 100.2* 100.9* 101.1* 100.2* 96.4  PLT 77* 76* 83* 97* 139*    Basic Metabolic Panel: Recent Labs  Lab 07/16/20 0706 07/16/20 0706 07/16/20 0930 07/16/20 1329 07/16/20 2122 07/17/20 0314 07/18/20 0424 07/19/20 0530 07/20/20 0415  NA 156*   < > 155*   < > 154* 154* 146* 145 148*  K 4.4   < > 4.3  --   --  4.4 4.3 3.8 4.0  CL 115*   < >  114*  --   --  109 103 102 104  CO2 31   < > 31  --   --  31 28 33* 32  GLUCOSE 230*   < > 237*  --   --  234* 194* 148* 124*  BUN 76*   <  > 77*  --   --  60* 51* 41* 40*  CREATININE 1.92*   < > 1.85*  --   --  1.49* 1.32* 1.13* 1.19*  CALCIUM 7.9*   < > 8.1*  --   --  7.9* 7.9* 7.5* 8.0*  MG 2.2  --   --   --   --  1.9 1.9 1.7 2.2  PHOS 3.2  --   --   --   --  2.4* 2.7 2.7 3.2   < > = values in this interval not displayed.   GFR: Estimated Creatinine Clearance: 53.2 mL/min (A) (by C-G formula based on SCr of 1.19 mg/dL (H)). Recent Labs  Lab 07/17/20 0314 07/18/20 0424 07/19/20 0530 07/20/20 0415  WBC 26.0* 22.9* 26.1* 22.3*    Liver Function Tests: Recent Labs  Lab 07/14/20 0404 07/16/20 0930 07/17/20 0314  AST 217* 54* 60*  ALT 304* 157* 125*  ALKPHOS 101 71 76  BILITOT 0.8 0.9 1.0  PROT 6.4* 5.9* 5.9*  ALBUMIN 2.7* 2.6* 2.8*   No results for input(s): LIPASE, AMYLASE in the last 168 hours. Recent Labs  Lab 07/13/20 1158  AMMONIA 41*    ABG    Component Value Date/Time   PHART 7.41 07/16/2020 0841   PCO2ART 52 (H) 07/16/2020 0841   PO2ART 76 (L) 07/16/2020 0841   HCO3 33.0 (H) 07/16/2020 0841   TCO2 29 02/20/2020 1156   ACIDBASEDEF 0.5 07/12/2020 1205   O2SAT 95.2 07/16/2020 0841     Coagulation Profile: Recent Labs  Lab 07/13/20 1158 07/14/20 1334  INR 1.4* 1.3*    Cardiac Enzymes: No results for input(s): CKTOTAL, CKMB, CKMBINDEX, TROPONINI in the last 168 hours.  HbA1C: Hgb A1c MFr Bld  Date/Time Value Ref Range Status  07/16/2020 07:06 AM 6.4 (H) 4.8 - 5.6 % Final    Comment:    (NOTE) Pre diabetes:          5.7%-6.4%  Diabetes:              >6.4%  Glycemic control for   <7.0% adults with diabetes   04/20/2017 09:09 AM 6.0 (H) 4.8 - 5.6 % Final    Comment:             Pre-diabetes: 5.7 - 6.4          Diabetes: >6.4          Glycemic control for adults with diabetes: <7.0     CBG: Recent Labs  Lab 07/19/20 1515 07/19/20 1949 07/20/20 0016 07/20/20 0408 07/20/20 0717  GLUCAP 221* 236* 139* 115* 104*       Critical care time:     Domingo Pulse  [place], AGACNP-BC [facility]    Please see Amion for pager details.

## 2020-07-20 NOTE — Progress Notes (Signed)
ID Doing better More alert, responding to questions appropriately Speech better  Patient Vitals for the past 24 hrs:  BP Temp Temp src Pulse Resp SpO2  07/20/20 0800 133/61 98 F (36.7 C) Oral (!) 103 (!) 32 100 %  07/20/20 0600 129/74 -- -- 95 (!) 28 100 %  07/20/20 0500 116/60 -- -- 96 (!) 25 98 %  07/20/20 0400 140/73 98.7 F (37.1 C) Oral 91 (!) 29 98 %  07/20/20 0300 137/78 -- -- 94 (!) 26 100 %  07/20/20 0200 123/71 -- -- 86 (!) 27 100 %  07/20/20 0100 135/78 -- -- 87 (!) 26 99 %  07/20/20 0000 129/72 98.5 F (36.9 C) Oral 90 (!) 27 100 %  07/19/20 2300 129/72 -- -- 90 (!) 29 99 %  07/19/20 2200 122/71 -- -- 88 (!) 22 100 %  07/19/20 2100 125/68 -- -- 88 (!) 26 100 %  07/19/20 2000 123/69 98.8 F (37.1 C) -- 91 (!) 27 95 %  07/19/20 1900 (!) 131/59 -- -- 92 (!) 27 96 %  07/19/20 1800 108/62 -- -- 95 (!) 26 99 %  07/19/20 1700 (!) 106/56 -- -- 99 (!) 23 100 %  07/19/20 1600 134/61 -- -- 98 (!) 27 100 %    Awake, alert Moving her upper and lower extremities Chest b/l air entry Decreased bases abd soft, Left foot    CBC Latest Ref Rng & Units 07/20/2020 07/19/2020 07/18/2020  WBC 4.0 - 10.5 K/uL 22.3(H) 26.1(H) 22.9(H)  Hemoglobin 12.0 - 15.0 g/dL 14.4 13.6 13.8  Hematocrit 36 - 46 % 45.0 43.9 45.3  Platelets 150 - 400 K/uL 139(L) 97(L) 83(L)    CMP Latest Ref Rng & Units 07/20/2020 07/19/2020 07/18/2020  Glucose 70 - 99 mg/dL 124(H) 148(H) 194(H)  BUN 8 - 23 mg/dL 40(H) 41(H) 51(H)  Creatinine 0.44 - 1.00 mg/dL 1.19(H) 1.13(H) 1.32(H)  Sodium 135 - 145 mmol/L 148(H) 145 146(H)  Potassium 3.5 - 5.1 mmol/L 4.0 3.8 4.3  Chloride 98 - 111 mmol/L 104 102 103  CO2 22 - 32 mmol/L 32 33(H) 28  Calcium 8.9 - 10.3 mg/dL 8.0(L) 7.5(L) 7.9(L)  Total Protein 6.5 - 8.1 g/dL - - -  Total Bilirubin 0.3 - 1.2 mg/dL - - -  Alkaline Phos 38 - 126 U/L - - -  AST 15 - 41 U/L - - -  ALT 0 - 44 U/L - - -    Micro 07/09/20- Kleb pneumo blood culture 07/06/20 BC  NG  Impression/recommendation  New encephalopathy and slurred speech- resolved stroke questioned - brain imaging nothing acute but has chronic microvascular disease  Klebsiella bacteremia with sepsis due to UTI with rt pyelonpehritis due to rt obstructing ureteric stone causing hydo S/p stent on 07/15/20 No fever in 5 days Repeat blood culture from 9/30 -No growtrh Continue ceftriaxone until 07/26/20   She was investigated for tick born illness as she was spiking and before we knew that she had obstructing stones- RMSF iGM positive but that is false positive - will not treat. Discussed with Dr.Simmering- pt has no risk factor for TBI   Ischemia of the feet-left > rt- could be the reason for persistent leucocytosis vascular is consulted   Hypernatremia- much better  Severe thrombocytopenia-- improving  Abnormal lfts- could be methotrexate, sepsis-improving  AKi- on CKD- improving  Pulmonary sarcoidosis on methylprednsione ( was on methotrexate at home)  home oxygen  H/o left ureteric stone s/p lithotripsy and stent in May  2021 - stent removed after that H/o Over active bladder, recurrent UTI  Discussed with her and her daughter.

## 2020-07-20 NOTE — Progress Notes (Signed)
[place] for Electrolyte Monitoring and Replacement   Recent Labs: Potassium (mmol/L)  Date Value  07/20/2020 4.0  12/28/2012 3.4 (L)   Magnesium (mg/dL)  Date Value  07/20/2020 2.2   Calcium (mg/dL)  Date Value  07/20/2020 8.0 (L)   Calcium, Total (mg/dL)  Date Value  12/28/2012 8.0 (L)   Albumin (g/dL)  Date Value  07/17/2020 2.8 (L)  05/27/2019 4.1  12/26/2012 3.0 (L)   Phosphorus (mg/dL)  Date Value  07/20/2020 3.2   Sodium (mmol/L)  Date Value  07/20/2020 148 (H)  05/27/2019 144  12/28/2012 142   Corrected Ca: 8.96 mg/dL  Assessment: Pharmacy consulted to replace electrolytes in this 64 year old female admitted with K pneumoniae PNA.Marland Kitchen Pt with PMH of Stage 3 CKD and baseline Scr of 1.0 in May 2021. Nephrology is following.  MIVF: 5% dextrose at 75 mL/hr  Goal of Therapy:  Electrolytes WNL   Plan:   No electrolyte replacement warranted today  F/u electrolytes with am labs  Dallie Piles, PharmD 07/20/2020 7:18 AM

## 2020-07-20 NOTE — Progress Notes (Signed)
Occupational Therapy Treatment Patient Details Name: CASLYN GUENTHNER MRN: [id number] DOB: Oct 04, 1956 Today's Date: 07/20/2020    History of present illness Angala Irving Shows is a 64 y.o. Female with a past medical history significant for pulmonary sarcoidosis (on 2L [place] at baseline), pulmonary hypertension, OSA, asthma, hypertension, hypothyroidism, IBS, and chronic kidney disease who presents to [facility] ED on 07/09/20 due to altered mental status and shortness of breath.  Admitted for management of acute hypoxic respiratory failure and sepsis related to CAP, UTI.  Hospital course significant for periods of delirium requiring precedex infusion; questionable DIC with ischemic changes to L foot, R great toe; cystoscopy with pyelogram (9/28); extubation (9/29); and acute code stroke (9/30) due to new-onset slurred speech, R hemiparesis (NIHSS 16).   OT comments  Ms. Juengal presents to OT with improving cognition and orientation, but remains limited by fatigue.  She presents as  grossly oriented to self, date, setting, and situation.  She continues to present with delayed processing time and impaired short term memory, intermittently requiring minimal verbal and tactile cues to follow simple one-step commands.  She reports fatigue and general body aches, but was agreeable to OT session focused on self care.  OTR provided max HOH assist for pt to wash face and self-feed with LUE.  Pt able to complete bilateral shoulder flexion ~90 degrees and with decent bilateral grip strength, but unable to use in functional tasks 2/2 fatigue and decreased ability to follow commands.  Pt able to wiggle toes on command but not initiate any other LE movement with verbal and tactile cues.  Pt grimaced with attempts to provide PROM to BLEs, but denied pain.  OTR educated pt on importance of independence with functional tasks as able in order to increase functional strengthening and endurance.  Pt verbalized understanding.  Pt unwilling  to participate in any functional mobility, but OTR provided max assist x2 to shift pt closer to head of bed.  Ms. Irving Shows will continue to benefit from skilled OT services in acute setting to address functional strengthening, endurance, and safety and independence in ADLs.  CIR remains appropriate discharge recommendation at this time.   Follow Up Recommendations  CIR    Equipment Recommendations  Other (comment) (defer recommendations to next level of care)    Recommendations for Other Services      Precautions / Restrictions Precautions Precautions: Fall Restrictions Weight Bearing Restrictions: No       Mobility Bed Mobility Overal bed mobility: Needs Assistance Bed Mobility: Rolling Rolling: Total assist;Max assist;+2 for safety/equipment         General bed mobility comments: Max assist x2 for shifting towards head of bed, pt declined any further mobility  Transfers Overall transfer level: Needs assistance               General transfer comment: pt declined, suspect heavy level of assist required    Balance Overall balance assessment:  (unable to assess)                                         ADL either performed or assessed with clinical judgement   ADL Overall ADL's : Needs assistance/impaired                                       General ADL  Comments: OTR provided max/hand over hand assist for grooming/feeding tasks at bedlevel.  Pt remains total assist for lower body dressing and toileting at bedlevel.     Vision Patient Visual Report: No change from baseline     Perception     Praxis      Cognition Arousal/Alertness: Awake/alert Behavior During Therapy: WFL for tasks assessed/performed Overall Cognitive Status: Within Functional Limits for tasks assessed Area of Impairment: Memory;Following commands;Safety/judgement;Awareness;Problem solving                     Memory: Decreased short-term  memory Following Commands: Follows one step commands inconsistently Safety/Judgement: Decreased awareness of deficits   Problem Solving: Slow processing;Decreased initiation;Difficulty sequencing;Requires verbal cues;Requires tactile cues General Comments: Pt A&O x4.  She continues to require intermittent min verbal and tactile cues to support following one-step commands.  Delayed processing.        Exercises Other Exercises Other Exercises: OTR provided max HOH assist for feeding and grooming tasks at bedlevel, max assist x2 for bed mobility.  Educated pt on importance of increasing independence for functional strengthening and improving endurance.   Shoulder Instructions       General Comments O2 removed for face washing, pt stable at 95% on RA.    Pertinent Vitals/ Pain       Pain Assessment: Faces Faces Pain Scale: Hurts little more Pain Location: pt endorses general body aches, grimaces with passive movement of BLEs but denies pain Pain Descriptors / Indicators: Moaning;Grimacing Pain Intervention(s): Limited activity within patient's tolerance;Monitored during session  Home Living                                          Prior Functioning/Environment              Frequency  Min 2X/week        Progress Toward Goals  OT Goals(current goals can now be found in the care plan section)  Progress towards OT goals: Progressing toward goals  Acute Rehab OT Goals Patient Stated Goal: to get some rest OT Goal Formulation: With patient Time For Goal Achievement: 08/01/20 Potential to Achieve Goals: Fair  Plan Discharge plan remains appropriate;Frequency remains appropriate    Co-evaluation                 AM-PAC OT "6 Clicks" Daily Activity     Outcome Measure   Help from another person eating meals?: A Lot Help from another person taking care of personal grooming?: A Lot Help from another person toileting, which includes using toliet,  bedpan, or urinal?: Total Help from another person bathing (including washing, rinsing, drying)?: A Lot Help from another person to put on and taking off regular upper body clothing?: A Lot Help from another person to put on and taking off regular lower body clothing?: Total 6 Click Score: 10    End of Session Equipment Utilized During Treatment: Oxygen (2L [place])  OT Visit Diagnosis: Other abnormalities of gait and mobility (R26.89);Muscle weakness (generalized) (M62.81)   Activity Tolerance Patient limited by fatigue   Patient Left in bed;with call bell/phone within reach;with family/visitor present (pt's daughter)   Nurse Communication          Time: 1610-9604 OT Time Calculation (min): 33 min  Charges: OT General Charges $OT Visit: 1 Visit OT Treatments $Self Care/Home Management : 23-37 mins  Kathyrn Drown  [place], OTR/L 07/20/20, 10:16 AM

## 2020-07-20 NOTE — Progress Notes (Addendum)
Subjective: Reports she thinks she is continuing to improve, but is fairly disoriented.  Denies headache or any other new symptoms.  Exam: Vitals:   07/20/20 0600 07/20/20 0800  BP: 129/74 133/61  Pulse: 95 (!) 103  Resp: (!) 28 (!) 32  Temp:  98 F (36.7 C)  SpO2: 100% 100%   Gen: In bed, NAD Resp: non-labored breathing, no acute distress Abd: soft, nt Extremities: Mottled feet with significant purple discoloration L > R  Neuro: MS: Awake, alert, oriented to month but reports the year is 2011, reports hospital is [facility]. Motor apraxia vs severe inattention noted on strength testing  CN: Visual fields full, EOMI, tongue midline, sensation intact, face symmetric, uvula elevates symmetrically Motor: She has a right > left, distal > proximal paresis.  She is able to follow commands in all four extremities, but frequently requires significant coaching on strength examination  Sensory: She endorses symmetric sensation  Pertinent Labs: Creatinine 1.13  MRI brain personally reviewed, small acute infarcts that do look concerning for possible embolic source, not classic watershed distribution   Impression: 64 year old female with a right greater than left paresis that occurred in the setting of severe sepsis. MRI today does look more embolic than watershed though perhaps some component of both (with the watershed insult being older and therefore harder to appreciate on DWI).  Additionally the patient's examination today is concerning for some hospital delirium due to difficulty with attention and disorientation.  Additionally I am asked to comment on safety of anticoagulation. Given concern for septic emboli, these are highly likely to bleed in the acute setting. While DVT prophylaxis should be continued, therapeutic anticoagulation is best avoided, though could be started cautiously for a life-threatening indication (such as saddle PE) with a risk-benefit discussion with  family  Recommendations: - TEE if endocarditis diagnosis would change IV antibiotic duration or management plan per ID/primary team - Hold on therapeutic anticoagulation outside of life-threatening indication (saddle PE etc)   - No neurological contraindication for DVT ppx with SubQ heparin  Additionally, please follow these delirium precautions:   - try to minimize deliriogenic medications as much as possible (J Am Geriatr Soc. 2012 Apr;60(4):616-31): benzodiazepines, anticholinergics, diphenhydramine, antihistamines, narcotics, Ambien/Lunesta/Sonata etc. - environmental support for delirium: Lights on during the day, patient up and out of bed as much as is feasible, OT/PT, quiet dimly lit room at night, reorient patient often, provide hearing aides and glasses if patient uses them routinely, minimize sleep disruptions as much as possible overnight.  As much as possible, reorient patient, and have them engage patient in activities, e.g. playing cards. TV should be off or on neutral background music unless patient engaged and watching. Try to keep interactions with the patient calm and quiet.    Lesleigh Noe MD-PhD [facility] [phone]  If 8pm- 8am, please page neurology on call as listed in [place].

## 2020-07-20 NOTE — TOC Progression Note (Signed)
Transition of Care [facility]) - Progression Note    Patient Details  Name: Stacie [name] MRN: [id number] Date of Birth: 1955-12-08  Transition of Care [facility]) CM/SW [place], LCSW Phone Number: 07/20/2020, 10:59 AM  Clinical Narrative:   PT/OT is recommending CIR. CSW confirmed with CIR Representative Mickel Baas that she has spoken with the family who is agreeable to CIR. Mickel Baas is following for possible CIR admission when patient is medically ready. Per Mickel Baas, patient has Medicare and [employer]. Asked Mickel Baas to let me know of any needs. CSW will continue to follow.        Barriers to Discharge: Continued Medical Work up  Expected Discharge Plan and Services         Living arrangements for the past 2 months: Single Family Home                                       Social Determinants of Health (SDOH) Interventions    Readmission Risk Interventions Readmission Risk Prevention Plan 07/17/2020  Transportation Screening Complete  PCP or Specialist Appt within 3-5 Days Complete  HRI or Home Care Consult Complete  Palliative Care Screening Complete  Medication Review (RN Care Manager) Complete  Some recent data might be hidden

## 2020-07-20 NOTE — Consult Note (Signed)
PODIATRY / FOOT AND ANKLE SURGERY CONSULTATION NOTE  Requesting Physician: Olga Millers NP  Reason for consult: Necrosis both forefeet  Chief Complaint: Dry gangrene of both feet   HPI: Stacie [name] is a 64 y.o. female with past medical history significant for pulmonary sarcoidosis on 2 L nasal cannula at baseline, pulmonary hypertension, asthma, OSA, hypertension, IBS, CKD, hypothyroidism who presented to the ED at [facility] on 07/09/2020 due to altered mental status and shortness of breath.  Patient was admitted to the hospital due to her condition and was diagnosed with Klebsiella pneumonia bacteremia due to UTI with associated encephalopathy requiring BiPAP.  Patient also was noted to have changes consistent with stroke due to potential shower emboli.  Patient has been more awake and alert since her admission.  She is steadily improving.  Podiatry team was consulted along with vascular service due to shower emboli causing gangrenous type changes to both forefeet.  Patient in the room resting in bed comfortably with no complaints currently.  Patient family also at bedside.  PMHx:  Past Medical History:  Diagnosis Date  . Arrhythmia    patient unaware if this is current  . Asthma   . GERD (gastroesophageal reflux disease)   . Heart murmur   . History of kidney stones   . HOH (hard of hearing)    wear aids  . Hypothyroidism   . IBS (irritable bowel syndrome)   . Pulmonary hypertension ([place])   . Sarcoid   . Sarcoidosis   . Seasonal allergies   . Sleep apnea CPAP with O2  . Wears hearing aid in both ears     Surgical Hx:  Past Surgical History:  Procedure Laterality Date  . CARDIAC CATHETERIZATION  10/18/2018   [facility]  . CATARACT EXTRACTION W/PHACO Left 07/31/2019   Procedure: CATARACT EXTRACTION PHACO AND INTRAOCULAR LENS PLACEMENT (IOC) LEFT 00:51.1  17.9%  9.15;  Surgeon: Leandrew Koyanagi, MD;  Location: [place];  Service:  Ophthalmology;  Laterality: Left;  keep this patient second  . COLON SURGERY     "colon was fused to bladder - operated on both"  . COLONOSCOPY  09/18/2007   diverticuli, no polyps  . COLONOSCOPY  05/26/2010   diverticuli, no polyps  . CYSTOSCOPY WITH STENT PLACEMENT Bilateral 07/14/2020   Procedure: CYSTOSCOPY WITH STENT PLACEMENT, RETROPYLOGRAM;  Surgeon: Billey Co, MD;  Location: [facility];  Service: Urology;  Laterality: Bilateral;  . CYSTOSCOPY/URETEROSCOPY/HOLMIUM LASER/STENT PLACEMENT Left 02/20/2020   Procedure: CYSTOSCOPY/URETEROSCOPY/LITHOTRIPSY /STENT PLACEMENT;  Surgeon: Hollice Espy, MD;  Location: [facility];  Service: Urology;  Laterality: Left;  . PARS PLANA VITRECTOMY Right 05/20/2015   Procedure: PARS PLANA VITRECTOMY WITH 25 GAUGE, laser;  Surgeon: Milus Height, MD;  Location: [facility];  Service: Ophthalmology;  Laterality: Right;  . PARTIAL HYSTERECTOMY  1990  . TUBAL LIGATION      FHx:  Family History  Problem Relation Age of Onset  . Allergies Father   . Asthma Father   . Colon cancer Father   . Allergies Brother   . Asthma Brother   . Breast cancer Maternal Grandmother     Social History:  reports that she has never smoked. She has never used smokeless tobacco. She reports that she does not drink alcohol and does not use drugs.  Allergies:  Allergies  Allergen Reactions  . Corn-Containing Products Diarrhea    headaches  . Nitrofurantoin Nausea Only  . Tramadol     Nausea and vomiting  .  Sulfa Antibiotics Rash    As an infant    Review of Systems: Unable to ascertain due to patient's status  Medications Prior to Admission  Medication Sig Dispense Refill  . budesonide-formoterol (SYMBICORT) 80-4.5 MCG/ACT inhaler Take 2 puffs first thing in am and then another 2 puffs about 12 hours later. (Patient taking differently: Inhale 2 puffs into the lungs in the morning and at bedtime. Take 2 puffs first thing in am and then another 2 puffs about 12  hours later.) 1 Inhaler 11  . denosumab (PROLIA) 60 MG/ML SOSY injection Inject 60 mg into the skin every 6 (six) months.    . dorzolamide (TRUSOPT) 2 % ophthalmic solution Place 1 drop into both eyes 2 (two) times daily.    Marland Kitchen escitalopram (LEXAPRO) 20 MG tablet     . folic acid (FOLVITE) 1 MG tablet Take 1 mg by mouth daily.  11  . latanoprost (XALATAN) 0.005 % ophthalmic solution Place 1 drop into the right eye at bedtime.    . methotrexate (TREXALL) 5 MG tablet TAKE 5 TABLETS (25 MG TOTAL) BY MOUTH EVERY 7 (SEVEN) DAYS    . Multiple Vitamins-Minerals (MULTIVITAMIN WITH MINERALS) tablet Take 1 tablet by mouth daily.    . NON FORMULARY CPAP nightly    . predniSONE (DELTASONE) 10 MG tablet Take 10 mg by mouth daily with breakfast. 12.5 mg    . SYNTHROID 137 MCG tablet TAKE 1 TABLET (137 MCG TOTAL) BY MOUTH DAILY BEFORE BREAKFAST. 90 tablet 0  . Treprostinil (TYVASO) 0.6 MG/ML SOLN Inhale 18 mcg into the lungs 4 (four) times daily. 12 breaths 4 times a day    . albuterol (VENTOLIN HFA) 108 (90 Base) MCG/ACT inhaler Inhale 2 puffs into the lungs every 6 (six) hours as needed for wheezing. (Patient not taking: Reported on 07/10/2020) 1 Inhaler 2  . brimonidine (ALPHAGAN) 0.2 % ophthalmic solution Place 1 drop into both eyes 3 (three) times daily.  (Patient not taking: Reported on 05/12/2020)    . calcium carbonate (TUMS - DOSED IN MG ELEMENTAL CALCIUM) 500 MG chewable tablet Chew 1 tablet by mouth daily as needed for indigestion or heartburn. (Patient not taking: Reported on 07/10/2020)    . cephALEXin (KEFLEX) 500 MG capsule Take 1 capsule (500 mg total) by mouth 3 (three) times daily. (Patient not taking: Reported on 06/12/2020) 21 capsule 0  . conjugated estrogens (PREMARIN) vaginal cream Apply one pea-sized amount around the opening of the urethra three times weekly. (Patient not taking: Reported on 07/10/2020) 30 g 3  . methotrexate (RHEUMATREX) 2.5 MG tablet Take by mouth. (Patient not taking:  Reported on 07/10/2020)    . mirabegron ER (MYRBETRIQ) 25 MG TB24 tablet Take 1 tablet (25 mg total) by mouth daily. (Patient not taking: Reported on 07/10/2020) 30 tablet 11  . oxybutynin (DITROPAN) 5 MG tablet  (Patient not taking: Reported on 07/10/2020)    . oxyCODONE-acetaminophen (PERCOCET/ROXICET) 5-325 MG tablet  (Patient not taking: Reported on 05/12/2020)    . OXYGEN Inhale 2 L into the lungs as needed.    Marland Kitchen Respiratory Therapy Supplies (FLUTTER) DEVI Use as directed (Patient not taking: Reported on 07/10/2020) 1 each 0  . VITAMIN D PO Take 1 tablet by mouth once a week. (Patient not taking: Reported on 07/10/2020)    . Vitamin D, Ergocalciferol, (DRISDOL) 1.25 MG (50000 UNIT) CAPS capsule Take 50,000 Units by mouth once a week. (Patient not taking: Reported on 06/12/2020)      Physical Exam:  General: Alert and oriented.  No apparent distress.  Vascular: DP/PT pulses nonpalpable bilateral.  Feet appear to be cold to touch.  Capillary fill time appears to be absent to digits 1 through 4 left foot and to the dorsal foot to the midfoot area.  Capillary fill time appears to be absent to the right hallux distally.  No hair growth to digits.  Neuro: Light touch sensation reduced to both feet.  Derm: Dusky type changes to the left forefoot and right forefoot.  Appears to be worse on the left side than right.  Capillary fill time appears to be absent to the left first, second, third, fourth digits to the level of the metatarsal phalangeal joints.  Patient also has dusky changes to the dorsal aspect of the left foot with capillary fill time appearing to be absent to the level of the midfoot.  Capillary fill time appears to be intact to all digits to the right foot except for the distal right hallux.  Violaceous hue noted to all digits.  No acute signs of infection present.  Pictures as seen below.  Left foot   Right foot   MSK: Patient able to move toes and both feet but decreased strength noted  overall.  Results for orders placed or performed during the hospital encounter of 07/09/20 (from the past 48 hour(s))  Glucose, capillary     Status: Abnormal   Collection Time: 07/18/20  7:39 PM  Result Value Ref Range   Glucose-Capillary 214 (H) 70 - 99 mg/dL    Comment: Glucose reference range applies only to samples taken after fasting for at least 8 hours.  Glucose, capillary     Status: Abnormal   Collection Time: 07/18/20 11:15 PM  Result Value Ref Range   Glucose-Capillary 210 (H) 70 - 99 mg/dL    Comment: Glucose reference range applies only to samples taken after fasting for at least 8 hours.  Glucose, capillary     Status: Abnormal   Collection Time: 07/19/20  3:52 AM  Result Value Ref Range   Glucose-Capillary 150 (H) 70 - 99 mg/dL    Comment: Glucose reference range applies only to samples taken after fasting for at least 8 hours.  CBC with Differential/Platelet     Status: Abnormal   Collection Time: 07/19/20  5:30 AM  Result Value Ref Range   WBC 26.1 (H) 4.0 - 10.5 K/uL   RBC 4.38 3.87 - 5.11 MIL/uL   Hemoglobin 13.6 12.0 - 15.0 g/dL   HCT 43.9 36 - 46 %   MCV 100.2 (H) 80.0 - 100.0 fL   MCH 31.1 26.0 - 34.0 pg   MCHC 31.0 30.0 - 36.0 g/dL   RDW 13.7 11.5 - 15.5 %   Platelets 97 (L) 150 - 400 K/uL    Comment: Immature Platelet Fraction may be clinically indicated, consider ordering this additional test [id number]    nRBC 0.0 0.0 - 0.2 %   Neutrophils Relative % 84 %   Neutro Abs 22.2 (H) 1.7 - 7.7 K/uL   Lymphocytes Relative 3 %   Lymphs Abs 0.9 0.7 - 4.0 K/uL   Monocytes Relative 8 %   Monocytes Absolute 2.0 (H) 0 - 1 K/uL   Eosinophils Relative 1 %   Eosinophils Absolute 0.1 0 - 0 K/uL   Basophils Relative 1 %   Basophils Absolute 0.2 (H) 0 - 0 K/uL   WBC Morphology MORPHOLOGY UNREMARKABLE    RBC Morphology MORPHOLOGY UNREMARKABLE    Smear  Review PLATELETS APPEAR DECREASED    Immature Granulocytes 3 %   Abs Immature Granulocytes 0.79 (H) 0.00 - 0.07  K/uL    Comment: Performed at [facility], [address] [place], [place] [address]  Phosphorus     Status: None   Collection Time: 07/19/20  5:30 AM  Result Value Ref Range   Phosphorus 2.7 2.5 - 4.6 mg/dL    Comment: Performed at [facility], [address] [place], [place] [address]  Magnesium     Status: None   Collection Time: 07/19/20  5:30 AM  Result Value Ref Range   Magnesium 1.7 1.7 - 2.4 mg/dL    Comment: Performed at [facility], [address] [place], [place] [address]  Basic metabolic panel     Status: Abnormal   Collection Time: 07/19/20  5:30 AM  Result Value Ref Range   Sodium 145 135 - 145 mmol/L   Potassium 3.8 3.5 - 5.1 mmol/L   Chloride 102 98 - 111 mmol/L   CO2 33 (H) 22 - 32 mmol/L   Glucose, Bld 148 (H) 70 - 99 mg/dL    Comment: Glucose reference range applies only to samples taken after fasting for at least 8 hours.   BUN 41 (H) 8 - 23 mg/dL   Creatinine, Ser 1.13 (H) 0.44 - 1.00 mg/dL   Calcium 7.5 (L) 8.9 - 10.3 mg/dL   GFR calc non Af Amer 51 (L) >60 mL/min   GFR calc Af Amer 59 (L) >60 mL/min   Anion gap 10 5 - 15    Comment: Performed at [facility], [address] [place], [place] [address]  Glucose, capillary     Status: Abnormal   Collection Time: 07/19/20  7:41 AM  Result Value Ref Range   Glucose-Capillary 147 (H) 70 - 99 mg/dL    Comment: Glucose reference range applies only to samples taken after fasting for at least 8 hours.  Glucose, capillary     Status: Abnormal   Collection Time: 07/19/20 11:41 AM  Result Value Ref Range   Glucose-Capillary 169 (H) 70 - 99 mg/dL    Comment: Glucose reference range applies only to samples taken after fasting for at least 8 hours.  Glucose, capillary     Status: Abnormal   Collection Time: 07/19/20  3:15 PM  Result Value Ref Range   Glucose-Capillary 221 (H) 70 - 99 mg/dL    Comment: Glucose reference range applies only to samples taken after fasting  for at least 8 hours.  Glucose, capillary     Status: Abnormal   Collection Time: 07/19/20  7:49 PM  Result Value Ref Range   Glucose-Capillary 236 (H) 70 - 99 mg/dL    Comment: Glucose reference range applies only to samples taken after fasting for at least 8 hours.  Glucose, capillary     Status: Abnormal   Collection Time: 07/20/20 12:16 AM  Result Value Ref Range   Glucose-Capillary 139 (H) 70 - 99 mg/dL    Comment: Glucose reference range applies only to samples taken after fasting for at least 8 hours.  Glucose, capillary     Status: Abnormal   Collection Time: 07/20/20  4:08 AM  Result Value Ref Range   Glucose-Capillary 115 (H) 70 - 99 mg/dL    Comment: Glucose reference range applies only to samples taken after fasting for at least 8 hours.  CBC with Differential/Platelet     Status: Abnormal   Collection Time:  07/20/20  4:15 AM  Result Value Ref Range   WBC 22.3 (H) 4.0 - 10.5 K/uL   RBC 4.67 3.87 - 5.11 MIL/uL   Hemoglobin 14.4 12.0 - 15.0 g/dL   HCT 45.0 36 - 46 %   MCV 96.4 80.0 - 100.0 fL   MCH 30.8 26.0 - 34.0 pg   MCHC 32.0 30.0 - 36.0 g/dL   RDW 14.0 11.5 - 15.5 %   Platelets 139 (L) 150 - 400 K/uL   nRBC 0.0 0.0 - 0.2 %   Neutrophils Relative % 83 %   Neutro Abs 18.9 (H) 1.7 - 7.7 K/uL   Lymphocytes Relative 4 %   Lymphs Abs 0.8 0.7 - 4.0 K/uL   Monocytes Relative 9 %   Monocytes Absolute 2.0 (H) 0 - 1 K/uL   Eosinophils Relative 1 %   Eosinophils Absolute 0.1 0 - 0 K/uL   Basophils Relative 1 %   Basophils Absolute 0.1 0 - 0 K/uL   Immature Granulocytes 2 %   Abs Immature Granulocytes 0.33 (H) 0.00 - 0.07 K/uL    Comment: Performed at [facility], [address] [place], [place] [address]  Phosphorus     Status: None   Collection Time: 07/20/20  4:15 AM  Result Value Ref Range   Phosphorus 3.2 2.5 - 4.6 mg/dL    Comment: Performed at [facility], [address] [place], [place] [address]  Magnesium     Status: None    Collection Time: 07/20/20  4:15 AM  Result Value Ref Range   Magnesium 2.2 1.7 - 2.4 mg/dL    Comment: Performed at [facility], [address] [place], [place] [address]  Basic metabolic panel     Status: Abnormal   Collection Time: 07/20/20  4:15 AM  Result Value Ref Range   Sodium 148 (H) 135 - 145 mmol/L   Potassium 4.0 3.5 - 5.1 mmol/L   Chloride 104 98 - 111 mmol/L   CO2 32 22 - 32 mmol/L   Glucose, Bld 124 (H) 70 - 99 mg/dL    Comment: Glucose reference range applies only to samples taken after fasting for at least 8 hours.   BUN 40 (H) 8 - 23 mg/dL   Creatinine, Ser 1.19 (H) 0.44 - 1.00 mg/dL   Calcium 8.0 (L) 8.9 - 10.3 mg/dL   GFR calc non Af Amer 48 (L) >60 mL/min   GFR calc Af Amer 55 (L) >60 mL/min   Anion gap 12 5 - 15    Comment: Performed at [facility], [address] [place], [place] [address]  Glucose, capillary     Status: Abnormal   Collection Time: 07/20/20  7:17 AM  Result Value Ref Range   Glucose-Capillary 104 (H) 70 - 99 mg/dL    Comment: Glucose reference range applies only to samples taken after fasting for at least 8 hours.  Glucose, capillary     Status: Abnormal   Collection Time: 07/20/20 11:40 AM  Result Value Ref Range   Glucose-Capillary 169 (H) 70 - 99 mg/dL    Comment: Glucose reference range applies only to samples taken after fasting for at least 8 hours.  Glucose, capillary     Status: Abnormal   Collection Time: 07/20/20  4:29 PM  Result Value Ref Range   Glucose-Capillary 177 (H) 70 - 99 mg/dL    Comment: Glucose reference range applies only to samples taken after fasting for at least 8 hours.  MR BRAIN WO CONTRAST  Result Date: 07/20/2020 CLINICAL DATA:  Stroke follow-up. EXAM: MRI HEAD WITHOUT CONTRAST TECHNIQUE: Multiplanar, multiecho pulse sequences of the brain and surrounding structures were obtained without intravenous contrast. COMPARISON:  MRI of the brain July 16, 2020. FINDINGS: The study is  partially degraded by motion. Brain: A few punctate foci of restricted diffusion/T2 hyperintensity are seen within the white matter of the bilateral frontal lobes, suggesting acute/subacute infarcts. No hemorrhage, hydrocephalus, extra-axial collection or mass lesion. Scattered and confluent foci of T2 hyperintensity are seen within the white matter of the cerebral hemispheres and within the pons, nonspecific, most likely related to small vessel ischemia. Vascular: Normal flow voids. Skull and upper cervical spine: Normal marrow signal. Sinuses/Orbits: Bilateral lens surgery.  Paranasal sinuses are clear Other: Left mastoid effusion. IMPRESSION: 1. A few punctate foci of restricted diffusion/T2 hyperintensity within the white matter of the bilateral frontal lobes, suggesting acute/subacute infarcts, likely embolic. 2. Moderate to advanced white matter disease, likely chronic microangiopathy. 3. Left mastoid effusion. Electronically Signed   By: Pedro Earls M.D.   On: 07/20/2020 13:19   US Carotid Bilateral  Result Date: 07/19/2020 CLINICAL DATA:  64 year old female with stroke EXAM: BILATERAL CAROTID DUPLEX ULTRASOUND TECHNIQUE: Pearline Cables scale imaging, color Doppler and duplex ultrasound were performed of bilateral carotid and vertebral arteries in the neck. COMPARISON:  None. FINDINGS: Criteria: Quantification of carotid stenosis is based on velocity parameters that correlate the residual internal carotid diameter with NASCET-based stenosis levels, using the diameter of the distal internal carotid lumen as the denominator for stenosis measurement. The following velocity measurements were obtained: RIGHT ICA:  Systolic 82 cm/sec, Diastolic 22 cm/sec CCA:  56 cm/sec SYSTOLIC ICA/CCA RATIO:  1.5 ECA:  80 cm/sec LEFT ICA:  Systolic 583 cm/sec, Diastolic 31 cm/sec CCA:  69 cm/sec SYSTOLIC ICA/CCA RATIO:  1.5 ECA:  67 cm/sec Right Brachial SBP: Not acquired Left Brachial SBP: Not acquired RIGHT CAROTID  ARTERY: No significant calcifications of the right common carotid artery. Intermediate waveform maintained. Heterogeneous and partially calcified plaque at the right carotid bifurcation. No significant lumen shadowing. Low resistance waveform of the right ICA. No significant tortuosity. RIGHT VERTEBRAL ARTERY: Antegrade flow with low resistance waveform. LEFT CAROTID ARTERY: No significant calcifications of the left common carotid artery. Intermediate waveform maintained. Heterogeneous and partially calcified plaque at the left carotid bifurcation without significant lumen shadowing. Low resistance waveform of the left ICA. No significant tortuosity. LEFT VERTEBRAL ARTERY:  Antegrade flow with low resistance waveform. IMPRESSION: Color duplex indicates minimal heterogeneous and calcified plaque, with no hemodynamically significant stenosis by duplex criteria in the extracranial cerebrovascular circulation. Signed, Dulcy Fanny. Dellia Nims, RPVI [facility] Radiology Electronically Signed   By: Corrie Mckusick D.O.   On: 07/19/2020 05:40   DG Chest Port 1 View  Result Date: 07/20/2020 CLINICAL DATA:  Acute respiratory failure. EXAM: PORTABLE CHEST 1 VIEW COMPARISON:  07/14/2020 and CT chest 07/10/2020. FINDINGS: Patient is rotated. Trachea is midline. Heart is enlarged, stable. Upper lobe predominant coarsened pulmonary parenchymal markings with relatively low lung volumes. Prominent epicardial fat versus small left pleural effusion. Thoracic vertebral body augmentations. IMPRESSION: 1. Pulmonary parenchymal findings of sarcoid without superimposed acute findings. 2. Prominent epicardial fat versus small left pleural effusion. Electronically Signed   By: Lorin Picket M.D.   On: 07/20/2020 07:47    Blood pressure 105/68, pulse 96, temperature 98.3 F (36.8 C), temperature source Oral, resp. rate 18, height 5' 7.01" (1.702  m), weight 86.3 kg, SpO2 100  %.  Assessment 1. Sepsis secondary to Klebsiella bacteremia due to UTI/pneumonia 2. Right and left foot ischemia  Plan -Patient seen and examined. -Appears to have stable ischemic changes to the left and right forefeet.  Unknown whether or not the digits will return back to normal but at this time both feet appear to be very cold to touch and pulses are not palpable.  Capillary fill time does not appear to be returning currently to the left dorsal forefoot to the level of the midfoot and digits 1 through 4 have absent capillary fill time.  The right hallux distal tip also has absent capillary fill time. -Discussed concern that potential gangrenous necrosis may ensue which may require subsequent amputation. -Vascular surgery has been consulted.  Discussed with Dr. Kathyrn Sheriff, pt's husband, in detail.  Currently he does not believe there is any plans for CT angio or intervention. -Discussed with patient and family that we need to let these areas demarcate further to see what will live and what will not.  This will determine the course of potential amputations in the future.  Currently patient's blood flow appears to be compromised so amputations may not result in an optimal outcome.  Patient may require more proximal amputations if blood flow status does not improve. -We will continue to monitor.  Currently no acute signs of infection seen to both feet.  Caroline More, DPM 07/20/2020, 6:26 PM

## 2020-07-20 NOTE — Progress Notes (Signed)
Inpatient Rehab Admissions Coordinator:   I met with pt. At bedside yesterday and she expressed interest in CIR admission once she is more stable. I encouraged her to participate with therapies so that we have a good case to send to her insurance for authorization  Clemens Catholic, [place], [place] Admissions Coordinator  [phone] (celll) [phone] (office)

## 2020-07-20 NOTE — Consult Note (Signed)
[facility] Vascular Consult Note  MRN : [id number]  Stacie [name] is a 64 y.o. ([birth year]) female who presents with chief complaint of  Chief Complaint  Patient presents with  . Shortness of Breath  . Altered Mental Status   History of Present Illness:  Stacie [name] is a 64 y.o. female with a past medical history significant for pulmonary sarcoidosis (on 2L [place] at baseline), pulmonary hypertension, OSA, asthma, hypertension, hypothyroidism, IBS, and chronic kidney disease who presents to [facility] ED on 07/09/20 due to altered mental status and shortness of breath.    Per notes, pt exhibited altered mental status for approximately 1 day, and also had associated shortness of breath, generalized weakness, and diarrhea.  She was noted to have received her influenza vaccine 24 hours ago.  She has also received 2 doses of the COVID-19 vaccine and a booster dose.  Patient found to have Klebsiella pneumoniae bacteremia due to UTI, associated encephalopathy requiring BiPAP.  Patient noted to have ischemic toes on July 13, 2020.  Vascular surgery was consulted by NP Rust-Chester in regard to possible "bilateral lower extremity ischemia".  Current Facility-Administered Medications  Medication Dose Route Frequency Provider Last Rate Last Admin  . acetaminophen (TYLENOL) suppository 650 mg  650 mg Rectal Q4H PRN Awilda Bill, NP   650 mg at 07/15/20 7062  . arformoterol (BROVANA) nebulizer solution 15 mcg  15 mcg Nebulization BID Tyler Pita, MD   15 mcg at 07/21/20 0816  . aspirin EC tablet 81 mg  81 mg Oral Daily [name], MD   81 mg at 07/20/20 1037  . atorvastatin (LIPITOR) tablet 40 mg  40 mg Oral Daily Tyler Pita, MD   40 mg at 07/20/20 1712  . bisacodyl (DULCOLAX) suppository 10 mg  10 mg Rectal Daily PRN Tukov-Yual, Magdalene S, NP      . budesonide (PULMICORT) nebulizer solution 0.25 mg  0.25 mg Nebulization BID Tyler Pita, MD    0.25 mg at 07/21/20 0811  . cefTRIAXone (ROCEPHIN) 2 g in sodium chloride 0.9 % 100 mL IVPB  2 g Intravenous Q24H Tyler Pita, MD 200 mL/hr at 07/20/20 1721 2 g at 07/20/20 1721  . Chlorhexidine Gluconate Cloth 2 % PADS 6 each  6 each Topical Q0600 Tyler Pita, MD   6 each at 07/20/20 2200  . dextrose 5 % solution   Intravenous Continuous Lateef, Mun[place], MD 75 mL/hr at 07/21/20 0500 Rate Verify at 07/21/20 0500  . docusate (COLACE) 50 MG/5ML liquid 100 mg  100 mg Oral BID Awilda Bill, NP   100 mg at 07/20/20 2150  . docusate sodium (COLACE) capsule 100 mg  100 mg Oral BID PRN Darel Hong D, NP      . dorzolamide (TRUSOPT) 2 % ophthalmic solution 1 drop  1 drop Both Eyes BID Dallie Piles, RPH   1 drop at 07/20/20 2150  . enoxaparin (LOVENOX) injection 40 mg  40 mg Subcutaneous Q24H Rust-Chester, Britton L, NP      . escitalopram (LEXAPRO) tablet 20 mg  20 mg Oral Daily Dallie Piles, RPH   20 mg at 07/20/20 1712  . feeding supplement (ENSURE ENLIVE) (ENSURE ENLIVE) liquid 237 mL  237 mL Oral TID BM Tyler Pita, MD   237 mL at 07/20/20 2031  . insulin aspart (novoLOG) injection 0-15 Units  0-15 Units Subcutaneous TID AC & HS Bradly Bienenstock, NP      .  latanoprost (XALATAN) 0.005 % ophthalmic solution 1 drop  1 drop Right Eye QHS Dallie Piles, RPH   1 drop at 07/20/20 2200  . levothyroxine (SYNTHROID) tablet 137 mcg  137 mcg Oral Q0600 Dallie Piles, RPH   137 mcg at 07/21/20 0175  . methylPREDNISolone sodium succinate (SOLU-MEDROL) 40 mg/mL injection 20 mg  20 mg Intravenous Daily Ottie Glazier, MD   20 mg at 07/20/20 1037  . multivitamin with minerals tablet 1 tablet  1 tablet Oral Daily Tyler Pita, MD      . pantoprazole (PROTONIX) injection 40 mg  40 mg Intravenous QHS Tyler Pita, MD   40 mg at 07/20/20 2144  . polyethylene glycol (MIRALAX / GLYCOLAX) packet 17 g  17 g Oral Daily PRN Darel Hong D, NP      . polyethylene glycol  (MIRALAX / GLYCOLAX) packet 17 g  17 g Oral Daily Awilda Bill, NP      . thiamine (B-1) injection 100 mg  100 mg Intravenous Daily Tyler Pita, MD   100 mg at 07/20/20 1037  . Treprostinil (TYVASO) inhalation solution 18 mcg  18 mcg Inhalation QID Dallie Piles, RPH   18 mcg at 07/21/20 1025   Past Medical History:  Diagnosis Date  . Arrhythmia    patient unaware if this is current  . Asthma   . GERD (gastroesophageal reflux disease)   . Heart murmur   . History of kidney stones   . HOH (hard of hearing)    wear aids  . Hypothyroidism   . IBS (irritable bowel syndrome)   . Pulmonary hypertension ([place])   . Sarcoid   . Sarcoidosis   . Seasonal allergies   . Sleep apnea CPAP with O2  . Wears hearing aid in both ears    Past Surgical History:  Procedure Laterality Date  . CARDIAC CATHETERIZATION  10/18/2018   [facility]  . CATARACT EXTRACTION W/PHACO Left 07/31/2019   Procedure: CATARACT EXTRACTION PHACO AND INTRAOCULAR LENS PLACEMENT (IOC) LEFT 00:51.1  17.9%  9.15;  Surgeon: Leandrew Koyanagi, MD;  Location: [facility];  Service: Ophthalmology;  Laterality: Left;  keep this patient second  . COLON SURGERY     "colon was fused to bladder - operated on both"  . COLONOSCOPY  09/18/2007   diverticuli, no polyps  . COLONOSCOPY  05/26/2010   diverticuli, no polyps  . CYSTOSCOPY WITH STENT PLACEMENT Bilateral 07/14/2020   Procedure: CYSTOSCOPY WITH STENT PLACEMENT, RETROPYLOGRAM;  Surgeon: Billey Co, MD;  Location: [facility];  Service: Urology;  Laterality: Bilateral;  . CYSTOSCOPY/URETEROSCOPY/HOLMIUM LASER/STENT PLACEMENT Left 02/20/2020   Procedure: CYSTOSCOPY/URETEROSCOPY/LITHOTRIPSY /STENT PLACEMENT;  Surgeon: Hollice Espy, MD;  Location: [facility];  Service: Urology;  Laterality: Left;  . PARS PLANA VITRECTOMY Right 05/20/2015   Procedure: PARS PLANA VITRECTOMY WITH 25 GAUGE, laser;  Surgeon: Milus Height, MD;  Location: [facility];  Service:  Ophthalmology;  Laterality: Right;  . PARTIAL HYSTERECTOMY  1990  . TUBAL LIGATION     Social History Social History   Tobacco Use  . Smoking status: Never Smoker  . Smokeless tobacco: Never Used  Vaping Use  . Vaping Use: Never used  Substance Use Topics  . Alcohol use: No  . Drug use: No   Family History Family History  Problem Relation Age of Onset  . Allergies Father   . Asthma Father   . Colon cancer Father   . Allergies Brother   . Asthma Brother   .  Breast cancer Maternal Grandmother   Denies family history of peripheral artery disease, venous disease or renal disease.  Allergies  Allergen Reactions  . Corn-Containing Products Diarrhea    headaches  . Nitrofurantoin Nausea Only  . Tramadol     Nausea and vomiting  . Sulfa Antibiotics Rash    As an infant   REVIEW OF SYSTEMS (Negative unless checked)  Constitutional: _0 Weight loss  _1 Fever  _2 Chills Cardiac: _3 Chest pain   _4 Chest pressure   _5 Palpitations   _6 Shortness of breath when laying flat   _7 Shortness of breath at rest   _8 Shortness of breath with exertion. Vascular:  _9 Pain in legs with walking   _10 Pain in legs at rest   _11 Pain in legs when laying flat   _12 Claudication   _13 Pain in feet when walking  _14 Pain in feet at rest  _15 Pain in feet when laying flat   _16 History of DVT   _17 Phlebitis   _18 Swelling in legs   _19 Varicose veins   _20 Non-healing ulcers Pulmonary:   _21 Uses home oxygen   _22 Productive cough   _23 Hemoptysis   _24 Wheeze  _25 COPD   _26 Asthma Neurologic:  _27 Dizziness  _28 Blackouts   _29 Seizures   _30 History of stroke   _31 History of TIA  _32 Aphasia   _33 Temporary blindness   _34 Dysphagia   _35 Weakness or numbness in arms   _36 Weakness or numbness in legs Musculoskeletal:  _37 Arthritis   _38 Joint swelling   _39 Joint pain   _40 Low back pain Hematologic:  _41 Easy bruising  _42 Easy bleeding   _43 Hypercoagulable state   _44 Anemic  _45 Hepatitis Gastrointestinal:  _46 Blood in stool   _47 Vomiting blood  _48 Gastroesophageal  reflux/heartburn   _49 Difficulty swallowing. Genitourinary:  _50 Chronic kidney disease   _51 Difficult urination  _52 Frequent urination  _53 Burning with urination   _54 Blood in urine Skin:  _55 Rashes   _56 Ulcers   _57 Wounds Psychological:  _58 History of anxiety   _59  History of major depression.  Physical Examination  Vitals:   07/20/20 2100 07/21/20 0000 07/21/20 0200 07/21/20 0400  BP:  125/69 131/74 134/71  Pulse: 90   83  Resp: (!) 35 (!) 25 (!) 38 (!) 29  Temp: 97.9 F (36.6 C)   (!) 97.5 F (36.4 C)  TempSrc: Oral     SpO2: 100%   100%  Weight:      Height:       Body mass index is 29.79 kg/m. Gen:  WD/WN, NAD Head: [place]/AT, No temporalis wasting. Prominent temp pulse not noted. Ear/Nose/Throat: Hearing grossly intact, nares w/o erythema or drainage, oropharynx w/o Erythema/Exudate Eyes: Sclera non-icteric, conjunctiva clear Neck: Trachea midline.  No JVD.  Pulmonary:  Good air movement, respirations not labored, equal bilaterally.  Cardiac: RRR, normal S1, S2. Vascular:  Vessel Right Left  Radial Palpable Palpable  Ulnar Palpable Palpable  Brachial Palpable Palpable  Carotid Palpable, without bruit Palpable, without bruit  Aorta Not palpable N/A  Femoral Palpable Palpable  Popliteal Palpable Palpable  PT Non-Palpable Non-Palpable  DP Non-Palpable Non-Palpable   Left lower extremity: Thigh soft.  Calf soft.  Nonpalpable pedal pulses.  Motor or sensory is intact.  Ischemic changes to toes.  Right lower extremity: Thigh soft.  Calf soft.  Nonpalpable pedal pulses.  Motor or sensory is intact.  Ischemic changes to toes.  Gastrointestinal: soft, non-tender/non-distended. No guarding/reflex.  Musculoskeletal: M/S 5/5 throughout.  Extremities without ischemic changes.  No deformity or atrophy. No edema. Neurologic: Sensation grossly intact in extremities.  Symmetrical.  Speech is fluent. Motor exam as listed above. Psychiatric: Judgment intact, Mood &  affect appropriate for pt's  clinical situation. Dermatologic: As above  Lymph : No Cervical, Axillary, or Inguinal lymphadenopathy.  CBC Lab Results  Component Value Date   WBC 20.6 (H) 07/21/2020   HGB 13.5 07/21/2020   HCT 43.0 07/21/2020   MCV 99.3 07/21/2020   PLT 159 07/21/2020   BMET    Component Value Date/Time   NA 141 07/21/2020 0617   NA 144 05/27/2019 0947   NA 142 12/28/2012 0454   K 4.2 07/21/2020 0617   K 3.4 (L) 12/28/2012 0454   CL 101 07/21/2020 0617   CL 111 (H) 12/28/2012 0454   CO2 29 07/21/2020 0617   CO2 27 12/28/2012 0454   GLUCOSE 144 (H) 07/21/2020 0617   GLUCOSE 95 12/28/2012 0454   BUN 41 (H) 07/21/2020 0617   BUN 21 05/27/2019 0947   BUN 8 12/28/2012 0454   CREATININE 1.18 (H) 07/21/2020 0617   CREATININE 1.14 12/28/2012 0454   CALCIUM 8.0 (L) 07/21/2020 0617   CALCIUM 8.0 (L) 12/28/2012 0454   GFRNONAA 48 (L) 07/21/2020 0617   GFRNONAA 53 (L) 12/28/2012 0454   GFRAA 56 (L) 07/21/2020 0617   GFRAA >60 12/28/2012 0454   Estimated Creatinine Clearance: 53.7 mL/min (A) (by C-G formula based on SCr of 1.18 mg/dL (H)).  COAG Lab Results  Component Value Date   INR 1.3 (H) 07/14/2020   INR 1.4 (H) 07/13/2020   INR 1.2 07/09/2020   Radiology EEG  Result Date: 07/16/2020 [name], MD     07/16/2020  5:23 PM ELECTROENCEPHALOGRAM REPORT Patient: Stacie [name]       Room #: IC11A EEG No. ID: [id number] Age: 64 y.o.        Sex: female Requesting Physician: Lanney Gins Report Date:  07/16/2020       Interpreting Physician: [name] History: Stacie [name] is an 64 y.o. female with altered mental status and right sided weakness Medications: Brovana, ASA, Rocephin, Colace, Insulin, Solumedrol, Tyvaso Conditions of Recording:  This is a 21 channel routine scalp EEG performed with bipolar and monopolar montages arranged in accordance to the international 10/20 system of electrode placement. One channel was dedicated to EKG recording. The patient is in the awake and  uncooperative state. Description:  Artifact is prominent during the recording often obscuring the background rhythm. When able to be visualized the background is slow and poorly organized.   It consists of low voltage activity in the delta-theta continuum.  For the most [name] this activity is continuous.  There are some rare short periods of generalized attenuation noted.  There are also some rare periodic discharges of triphasic morphology noted.  No epileptiform activity is noted.  Hyperventilation and ntermittent photic stimulation were not performed. IMPRESSION: This is an abnormal EEG secondary to general background slowing with rare triphasic waves noted.  This finding may be seen with a diffuse cerebral disturbance that is etiologically nonspecific, but may include a metabolic encephalopathy, among other possibilities.  No epileptiform activity was noted.  [name], MD Neurology [phone] 07/16/2020, 5:18 PM   CT ABDOMEN PELVIS WO CONTRAST  Result Date: 07/17/2020 CLINICAL DATA:  Leukocytosis.  History of ureteral stents. EXAM: CT ABDOMEN AND PELVIS WITHOUT CONTRAST TECHNIQUE: Multidetector CT imaging of the abdomen and pelvis was performed following the standard protocol without IV contrast. COMPARISON:  July 14, 2020. FINDINGS: Lower chest: Stable chronic findings seen consistent with history of sarcoidosis. Hepatobiliary: No focal liver abnormality is seen. No gallstones, gallbladder wall thickening, or biliary  dilatation. Pancreas: Unremarkable. No pancreatic ductal dilatation or surrounding inflammatory changes. Spleen: Normal in size without focal abnormality. Adrenals/Urinary Tract: Adrenal glands appear normal. Small nonobstructive right renal calculi are noted. Bilateral ureteral stents are noted in grossly good position. No hydronephrosis is noted. Foley catheter is noted within urinary bladder. Stomach/Bowel: Stomach is within normal limits. Appendix appears normal. No evidence of  bowel wall thickening, distention, or inflammatory changes. Vascular/Lymphatic: Aortic atherosclerosis. No enlarged abdominal or pelvic lymph nodes. Reproductive: Status post hysterectomy. No adnexal masses. Other: No abdominal wall hernia or abnormality. No abdominopelvic ascites. Musculoskeletal: No acute or significant osseous findings. IMPRESSION: 1. Small nonobstructive right renal calculi. Bilateral ureteral stents are noted in grossly good position. No hydronephrosis is noted. 2. Stable chronic findings seen in visualized lung bases consistent with history of sarcoidosis. 3. Aortic atherosclerosis. Aortic Atherosclerosis (ICD10-I70.0). Electronically Signed   By: Marijo Conception M.D.   On: 07/17/2020 12:28   CT ABDOMEN PELVIS WO CONTRAST  Result Date: 07/14/2020 CLINICAL DATA:  Worsening bacteremia and fevers, initial encounter EXAM: CT ABDOMEN AND PELVIS WITHOUT CONTRAST TECHNIQUE: Multidetector CT imaging of the abdomen and pelvis was performed following the standard protocol without IV contrast. COMPARISON:  Ultrasound from the previous day, CT from 07/10/2020. FINDINGS: Lower chest: Some fibrotic changes are noted in the bases bilaterally consistent with the patient's given clinical history of sarcoidosis. Small pleural effusions are noted bilaterally new from the prior exam. Hepatobiliary: Liver and gallbladder are within normal limits. Minimal perihepatic fluid is noted. Pancreas: Unremarkable. No pancreatic ductal dilatation or surrounding inflammatory changes. Spleen: Normal in size without focal abnormality. Adrenals/Urinary Tract: Adrenal glands are within normal limits. Left kidney shows no renal calculi or obstructive changes. The left ureter is within normal limits. The bladder is decompressed by Foley catheter. Right kidney demonstrates significant perinephric stranding with mild hydronephrosis and proximal hydroureter. Tiny nonobstructing stone is noted in the lower pole. In the proximal  right ureter there is a 7-8 mm stone identified causing the obstructive change as well as the perinephric stranding. These inflammatory changes surround the ureter throughout its course. The more distal ureter shows no obstructive change. Stomach/Bowel: No obstructive or inflammatory changes of the colon are seen. The appendix is within normal limits. No inflammatory changes are seen. Small bowel and stomach are unremarkable with the exception of a small duodenal diverticulum adjacent to the head of the pancreas. Vascular/Lymphatic: Aortic atherosclerosis. No enlarged abdominal or pelvic lymph nodes. Reproductive: Status post hysterectomy. No adnexal masses. Other: Mild free fluid is noted within the abdomen. Musculoskeletal: Degenerative changes of lumbar spine are noted. IMPRESSION: 7-8 mm proximal right ureteral stone with obstructive change and perinephric stranding. Minimal free fluid within the abdomen and pelvis. Chronic changes in the lung bases consistent with the given clinical history of sarcoidosis. Electronically Signed   By: Inez Catalina M.D.   On: 07/14/2020 16:08   CT ANGIO HEAD W OR WO CONTRAST  Result Date: 07/16/2020 CLINICAL DATA:  Stroke suspected.  Unable to move right side. EXAM: CT ANGIOGRAPHY HEAD AND NECK CT PERFUSION BRAIN TECHNIQUE: Multidetector CT imaging of the head and neck was performed using the standard protocol during bolus administration of intravenous contrast. Multiplanar CT image reconstructions and MIPs were obtained to evaluate the vascular anatomy. Carotid stenosis measurements (when applicable) are obtained utilizing NASCET criteria, using the distal internal carotid diameter as the denominator. Multiphase CT imaging of the brain was performed following IV bolus contrast injection. Subsequent parametric perfusion maps were calculated  using RAPID software. CONTRAST:  163m OMNIPAQUE IOHEXOL 350 MG/ML SOLN COMPARISON:  CT head 07/10/2020 FINDINGS: CT HEAD FINDINGS Brain:  Evaluation is limited by patient motion. There is no definite evidence of acute large vascular territory infarct. There is similar extensive patchy white matter hypoattenuation, most likely the sequela of chronic microvascular ischemic disease. No acute hemorrhage. No abnormal mass effect or mass lesion. No hydrocephalus. Vascular: No hyperdense vessel identified. Calcific atherosclerosis. Skull: No evidence of acute fracture. Sinuses/Orbits: Limit evaluation without evidence of fracture or focal lesion. Other: None. ASPECTS ([facility] Early CT Score) Total score (0-10 with 10 being normal): 10 Review of the MIP images confirms the above findings CTA NECK FINDINGS Nondiagnostic arterial evaluation in the upper neck/skull base, including the internal carotid arteries in the neck and mid to distal vertebral arteries. Aortic arch: Calcified atherosclerosis without evidence of significant stenosis or aneurysm. Right carotid system: The visualized common carotid artery is within normal limits without significant stenosis or occlusion. Nondiagnostic evaluation of the distal common carotid artery and internal carotid artery to the level of the petrous carotid. Left carotid system: Calcified and noncalcified atherosclerosis of the common carotid artery without evidence of significant stenosis. Nondiagnostic evaluation of the distal common carotid artery and internal carotid artery to the level of the petrous carotid. Vertebral arteries: Approximately, no significant stenosis or occlusion. Nondiagnostic evaluation of the mid to distal vertebral arteries. Skeleton: Multilevel severe degenerative change. Other neck: No evidence of mass or adenopathy. Upper chest: Partially imaged fibrotic lung changes, possibly related to the patient's history of sarcoidosis. Review of the MIP images confirms the above findings CTA HEAD FINDINGS Anterior circulation: Calcific atherosclerosis of bilateral cavernous carotids without  evidence of greater than 50% narrowing. Bilateral M1 MCAs and proximal M2 MCA is are patent without evidence of significant stenosis. Distal MCA branches appear relatively symmetric. The right A1 ACA is hypoplastic with a large left A1 ACA, likely anatomic variation. Posterior circulation: No significant stenosis or occlusion of the distal V4 vertebral arteries and basilar artery. Fetal type right PCA. No significant stenosis identified of the posterior cerebral arteries. No aneurysm. Venous sinuses: As permitted by contrast timing, patent.ww Review of the MIP images confirms the above findings CT Brain Perfusion Findings: ASPECTS: 10 CBF (<30%) Volume: 015mPerfusion (Tmax>6.0s) volume: 62m68mismatch Volume: 62mL47mlculated T-max greater than 6 sec and mismatch is favored artifactual given location in a region of streak/motion and non vascular territory. IMPRESSION: 1. Overall limited evaluation given patient motion with nondiagnostic arterial evaluation in the upper neck/skull base, including the internal carotid arteries and mid to distal vertebral arteries in the neck. Within this limitation, no evidence of acute intracranial abnormality, large vessel occlusion or significant (greater than 50%) arterial stenosis. Repeat CTA neck could further evaluate if clinically indicated. 2. No convincing penumbra. Small area of reported mismatch is favored artifactual. 3. Partially imaged fibrotic lung changes, possibly related to the patient's reported history of sarcoidosis. Code stroke imaging results were communicated on 07/16/2020 at 11:58 am to provider Dr. ReynDoy Mince telephone, who verbally acknowledged these results. Electronically Signed   By: FredMargaretha Sheffield  On: 07/16/2020 12:17   DG Abd 1 View  Result Date: 07/15/2020 CLINICAL DATA:  Abdominal distension. EXAM: ABDOMEN - 1 VIEW COMPARISON:  November 18, 2014. FINDINGS: The bowel gas pattern is normal. Bilateral ureteral stents are noted. Surgical clips  are seen overlying the sacrum. No radio-opaque calculi or other significant radiographic abnormality are seen. IMPRESSION: No  evidence of bowel obstruction or ileus. Electronically Signed   By: Marijo Conception M.D.   On: 07/15/2020 08:28   CT Head Wo Contrast  Result Date: 07/10/2020 CLINICAL DATA:  Increasing confusion and weakness EXAM: CT HEAD WITHOUT CONTRAST TECHNIQUE: Contiguous axial images were obtained from the base of the skull through the vertex without intravenous contrast. COMPARISON:  None. FINDINGS: Brain: Mild chronic white matter ischemic changes are noted. No findings to suggest acute hemorrhage, acute infarction or space-occupying mass lesion is seen. Vascular: No hyperdense vessel or unexpected calcification. Skull: Normal. Negative for fracture or focal lesion. Sinuses/Orbits: No acute finding. Other: None. IMPRESSION: Chronic white matter ischemic changes without acute abnormality Electronically Signed   By: Inez Catalina M.D.   On: 07/10/2020 01:04   CT ANGIO NECK W OR WO CONTRAST  Result Date: 07/16/2020 CLINICAL DATA:  Stroke suspected.  Unable to move right side. EXAM: CT ANGIOGRAPHY HEAD AND NECK CT PERFUSION BRAIN TECHNIQUE: Multidetector CT imaging of the head and neck was performed using the standard protocol during bolus administration of intravenous contrast. Multiplanar CT image reconstructions and MIPs were obtained to evaluate the vascular anatomy. Carotid stenosis measurements (when applicable) are obtained utilizing NASCET criteria, using the distal internal carotid diameter as the denominator. Multiphase CT imaging of the brain was performed following IV bolus contrast injection. Subsequent parametric perfusion maps were calculated using RAPID software. CONTRAST:  132m OMNIPAQUE IOHEXOL 350 MG/ML SOLN COMPARISON:  CT head 07/10/2020 FINDINGS: CT HEAD FINDINGS Brain: Evaluation is limited by patient motion. There is no definite evidence of acute large vascular territory  infarct. There is similar extensive patchy white matter hypoattenuation, most likely the sequela of chronic microvascular ischemic disease. No acute hemorrhage. No abnormal mass effect or mass lesion. No hydrocephalus. Vascular: No hyperdense vessel identified. Calcific atherosclerosis. Skull: No evidence of acute fracture. Sinuses/Orbits: Limit evaluation without evidence of fracture or focal lesion. Other: None. ASPECTS ([facility] Early CT Score) Total score (0-10 with 10 being normal): 10 Review of the MIP images confirms the above findings CTA NECK FINDINGS Nondiagnostic arterial evaluation in the upper neck/skull base, including the internal carotid arteries in the neck and mid to distal vertebral arteries. Aortic arch: Calcified atherosclerosis without evidence of significant stenosis or aneurysm. Right carotid system: The visualized common carotid artery is within normal limits without significant stenosis or occlusion. Nondiagnostic evaluation of the distal common carotid artery and internal carotid artery to the level of the petrous carotid. Left carotid system: Calcified and noncalcified atherosclerosis of the common carotid artery without evidence of significant stenosis. Nondiagnostic evaluation of the distal common carotid artery and internal carotid artery to the level of the petrous carotid. Vertebral arteries: Approximately, no significant stenosis or occlusion. Nondiagnostic evaluation of the mid to distal vertebral arteries. Skeleton: Multilevel severe degenerative change. Other neck: No evidence of mass or adenopathy. Upper chest: Partially imaged fibrotic lung changes, possibly related to the patient's history of sarcoidosis. Review of the MIP images confirms the above findings CTA HEAD FINDINGS Anterior circulation: Calcific atherosclerosis of bilateral cavernous carotids without evidence of greater than 50% narrowing. Bilateral M1 MCAs and proximal M2 MCA is are patent without  evidence of significant stenosis. Distal MCA branches appear relatively symmetric. The right A1 ACA is hypoplastic with a large left A1 ACA, likely anatomic variation. Posterior circulation: No significant stenosis or occlusion of the distal V4 vertebral arteries and basilar artery. Fetal type right PCA. No significant stenosis identified of the posterior cerebral arteries.  No aneurysm. Venous sinuses: As permitted by contrast timing, patent.ww Review of the MIP images confirms the above findings CT Brain Perfusion Findings: ASPECTS: 10 CBF (<30%) Volume: 77m Perfusion (Tmax>6.0s) volume: 652mMismatch Volume: 58m8malculated T-max greater than 6 sec and mismatch is favored artifactual given location in a region of streak/motion and non vascular territory. IMPRESSION: 1. Overall limited evaluation given patient motion with nondiagnostic arterial evaluation in the upper neck/skull base, including the internal carotid arteries and mid to distal vertebral arteries in the neck. Within this limitation, no evidence of acute intracranial abnormality, large vessel occlusion or significant (greater than 50%) arterial stenosis. Repeat CTA neck could further evaluate if clinically indicated. 2. No convincing penumbra. Small area of reported mismatch is favored artifactual. 3. Partially imaged fibrotic lung changes, possibly related to the patient's reported history of sarcoidosis. Code stroke imaging results were communicated on 07/16/2020 at 11:58 am to provider Dr. ReyDoy Mincea telephone, who verbally acknowledged these results. Electronically Signed   By: FreMargaretha Sheffield   On: 07/16/2020 12:17   CT Chest Wo Contrast  Result Date: 07/10/2020 CLINICAL DATA:  Pneumonia, dyspnea, altered mental status. Pulmonary sarcoidosis. EXAM: CT CHEST WITHOUT CONTRAST TECHNIQUE: Multidetector CT imaging of the chest was performed following the standard protocol without IV contrast. COMPARISON:  02/20/2015 FINDINGS: Cardiovascular:  Cardiac size within normal limits. Mild calcification of the mitral valve annulus. No significant coronary artery calcification. No pericardial effusion. The central pulmonary arteries are enlarged in keeping with changes of pulmonary arterial hypertension. Atherosclerotic calcification is seen within the aortic arch and proximal arch vasculature as well as the descending thoracic aorta. The aorta is of normal caliber. Mediastinum/Nodes: No pathologic thoracic adenopathy. Small hiatal hernia. Lungs/Pleura: There are again identified biapical predominant peribronchovascular interstitial thickening, bronchiectasis, and fibrosis with superior retraction of the hila in keeping with chronic changes of pulmonary sarcoidosis. Scattered ground-glass pulmonary infiltrate is best visualized within the anterior segment of the right upper lobe, but also, subtly, within the lower lobes bilaterally in keeping with active inflammatory change. No focal pulmonary nodules. No pneumothorax or pleural effusion. Upper Abdomen: Unremarkable Musculoskeletal: no acute bone abnormality. T7, T8, and T9 vertebroplasty has been performed. Chronic appearing superior endplate fracture of T12B63 noted, though is new from prior examination. IMPRESSION: Chronic changes of pulmonary sarcoidosis with biapical predominant pulmonary fibrosis. Very mild superimposed active inflammatory infiltrate. Morphologic changes compatible with pulmonary arterial hypertension. Aortic Atherosclerosis (ICD10-I70.0). Electronically Signed   By: AshFidela Salisbury   On: 07/10/2020 01:13   MR BRAIN WO CONTRAST  Result Date: 07/20/2020 CLINICAL DATA:  Stroke follow-up. EXAM: MRI HEAD WITHOUT CONTRAST TECHNIQUE: Multiplanar, multiecho pulse sequences of the brain and surrounding structures were obtained without intravenous contrast. COMPARISON:  MRI of the brain July 16, 2020. FINDINGS: The study is partially degraded by motion. Brain: A few punctate foci of  restricted diffusion/T2 hyperintensity are seen within the white matter of the bilateral frontal lobes, suggesting acute/subacute infarcts. No hemorrhage, hydrocephalus, extra-axial collection or mass lesion. Scattered and confluent foci of T2 hyperintensity are seen within the white matter of the cerebral hemispheres and within the pons, nonspecific, most likely related to small vessel ischemia. Vascular: Normal flow voids. Skull and upper cervical spine: Normal marrow signal. Sinuses/Orbits: Bilateral lens surgery.  Paranasal sinuses are clear Other: Left mastoid effusion. IMPRESSION: 1. A few punctate foci of restricted diffusion/T2 hyperintensity within the white matter of the bilateral frontal lobes, suggesting acute/subacute infarcts, likely embolic. 2. Moderate to advanced white  matter disease, likely chronic microangiopathy. 3. Left mastoid effusion. Electronically Signed   By: Pedro Earls M.D.   On: 07/20/2020 13:19   MR BRAIN W WO CONTRAST  Result Date: 07/16/2020 CLINICAL DATA:  Neuro deficit, acute, stroke suspected. EXAM: MRI HEAD WITHOUT AND WITH CONTRAST TECHNIQUE: Multiplanar, multiecho pulse sequences of the brain and surrounding structures were obtained without and with intravenous contrast. CONTRAST:  61m GADAVIST GADOBUTROL 1 MMOL/ML IV SOLN COMPARISON:  Same day CT imaging FINDINGS: Brain: Severely limited evaluation with multiple nondiagnostic sequences. Diffusion-weighted imaging is significantly limited with multiple small areas of DWI hyperintensity in the white matter. Patchy bilateral white matter T2/FLAIR hyperintensities, likely the sequela of chronic microvascular ischemic disease. Mild diffuse cerebral volume loss with ex vacuo ventricular dilation. No hydrocephalus. No large hemorrhage, mass lesion, or significant mass effect. Vascular: Better evaluated on same-day CT code stroke. Flow voids are grossly maintained at the skull base. Skull and upper cervical  spine: Not well evaluated Sinuses/Orbits: No substantial paranasal sinus disease. No acute orbital abnormality. Other: Small left mastoid effusion. IMPRESSION: Severely limited evaluation with multiple nondiagnostic sequences. Diffusion-weighted imaging is significantly limited with multiple small areas of DWI hyperintensity in the bilateral white matter representing artifact versus small infarcts. Repeat MRI (possibly with sedation) could further evaluate if clinically indicated. Electronically Signed   By: FMargaretha SheffieldMD   On: 07/16/2020 12:46   [place]NAL  Result Date: 07/13/2020 CLINICAL DATA:  Acute renal failure. EXAM: RENAL / URINARY TRACT ULTRASOUND COMPLETE COMPARISON:  03/13/2020 FINDINGS: Right Kidney: Renal measurements: 11.9 x 5.7 x 5.3 cm = volume: 187 mL. Echogenicity within normal limits. No mass or hydronephrosis visualized. Left Kidney: Renal measurements: 10.4 x 5.6 x 4.3 cm = volume: 129 mL. Echogenicity within normal limits. No mass or hydronephrosis visualized. Bladder: Decompressed by a Foley catheter. Other: Small volume free fluid in the right upper quadrant. IMPRESSION: 1. Unremarkable appearance of the kidneys. No hydronephrosis. 2. Small volume right upper quadrant ascites. Electronically Signed   By: ALogan BoresM.D.   On: 07/13/2020 14:30   [place]otid Bilateral  Result Date: 07/19/2020 CLINICAL DATA:  64year old female with stroke EXAM: BILATERAL CAROTID DUPLEX ULTRASOUND TECHNIQUE: GPearline Cablesscale imaging, color Doppler and duplex ultrasound were performed of bilateral carotid and vertebral arteries in the neck. COMPARISON:  None. FINDINGS: Criteria: Quantification of carotid stenosis is based on velocity parameters that correlate the residual internal carotid diameter with NASCET-based stenosis levels, using the diameter of the distal internal carotid lumen as the denominator for stenosis measurement. The following velocity measurements were obtained: RIGHT ICA:  Systolic 82  cm/sec, Diastolic 22 cm/sec CCA:  56 cm/sec SYSTOLIC ICA/CCA RATIO:  1.5 ECA:  80 cm/sec LEFT ICA:  Systolic 1841cm/sec, Diastolic 31 cm/sec CCA:  69 cm/sec SYSTOLIC ICA/CCA RATIO:  1.5 ECA:  67 cm/sec Right Brachial SBP: Not acquired Left Brachial SBP: Not acquired RIGHT CAROTID ARTERY: No significant calcifications of the right common carotid artery. Intermediate waveform maintained. Heterogeneous and partially calcified plaque at the right carotid bifurcation. No significant lumen shadowing. Low resistance waveform of the right ICA. No significant tortuosity. RIGHT VERTEBRAL ARTERY: Antegrade flow with low resistance waveform. LEFT CAROTID ARTERY: No significant calcifications of the left common carotid artery. Intermediate waveform maintained. Heterogeneous and partially calcified plaque at the left carotid bifurcation without significant lumen shadowing. Low resistance waveform of the left ICA. No significant tortuosity. LEFT VERTEBRAL ARTERY:  Antegrade flow with low resistance waveform. IMPRESSION: Color duplex indicates  minimal heterogeneous and calcified plaque, with no hemodynamically significant stenosis by duplex criteria in the extracranial cerebrovascular circulation. Signed, Dulcy Fanny. Dellia Nims, RPVI [facility] Radiology Electronically Signed   By: Corrie Mckusick D.O.   On: 07/19/2020 05:40   US Venous Img Lower Bilateral (DVT)  Result Date: 07/13/2020 CLINICAL DATA:  Bilateral leg edema. EXAM: BILATERAL LOWER EXTREMITY VENOUS DOPPLER ULTRASOUND BILATERAL LOWER EXTREMITY VENOUS DOPPLER ULTRASOUND TECHNIQUE: Gray-scale sonography with graded compression, as well as color Doppler and duplex ultrasound were performed to evaluate the lower extremity deep venous systems from the level of the common femoral vein and including the common femoral, femoral, profunda femoral, popliteal and calf veins including the posterior tibial, peroneal and gastrocnemius  veins when visible. The superficial great saphenous vein was also interrogated. Spectral Doppler was utilized to evaluate flow at rest and with distal augmentation maneuvers in the common femoral, femoral and popliteal veins. COMPARISON:  None. FINDINGS: RIGHT LOWER EXTREMITY Common Femoral Vein: No evidence of thrombus. Normal compressibility, respiratory phasicity and response to augmentation. Saphenofemoral Junction: No evidence of thrombus. Normal compressibility and flow on color Doppler imaging. Profunda Femoral Vein: No evidence of thrombus. Normal compressibility and flow on color Doppler imaging. Femoral Vein: No evidence of thrombus. Normal compressibility, respiratory phasicity and response to augmentation. Popliteal Vein: No evidence of thrombus. Normal compressibility, respiratory phasicity and response to augmentation. Calf Veins: No evidence of thrombus. Normal compressibility and flow on color Doppler imaging. Superficial Great Saphenous Vein: No evidence of thrombus. Normal compressibility. LEFT LOWER EXTREMITY Common Femoral Vein: No evidence of thrombus. Normal compressibility, respiratory phasicity and response to augmentation. Saphenofemoral Junction: No evidence of thrombus. Normal compressibility and flow on color Doppler imaging. Profunda Femoral Vein: No evidence of thrombus. Normal compressibility and flow on color Doppler imaging. Femoral Vein: No evidence of thrombus. Normal compressibility, respiratory phasicity and response to augmentation. Popliteal Vein: No evidence of thrombus. Normal compressibility, respiratory phasicity and response to augmentation. Calf Veins: No evidence of thrombus. Normal compressibility and flow on color Doppler imaging. Superficial Great Saphenous Vein: No evidence of thrombus. Normal compressibility. IMPRESSION: No evidence of deep venous thrombosis in either lower extremity. Electronically Signed   By: Margaretha Sheffield MD   On: 07/13/2020 14:22   US  ARTERIAL LOWER EXTREMITY DUPLEX BILATERAL  Result Date: 07/15/2020 CLINICAL DATA:  64 year old female with history of critical limb ischemia. EXAM: BILATERAL LOWER EXTREMITY ARTERIAL DUPLEX SCAN TECHNIQUE: Gray-scale sonography as well as color Doppler and duplex ultrasound was performed to evaluate the arteries of both lower extremities including the common, superficial and profunda femoral arteries, popliteal artery and calf arteries. COMPARISON:  None. FINDINGS: Right Lower Extremity ABI: Not obtained. Inflow: Normal common femoral arterial waveforms and velocities. No evidence of inflow (aortoiliac) disease. Outflow: Normal profunda femoral, superficial femoral and popliteal arterial waveforms and velocities. No focal elevation of the PSV to suggest stenosis. Runoff: Normal posterior and anterior tibial arterial waveforms and velocities. Vessels are patent to the ankle. Left Lower Extremity ABI: Not obtained. Inflow: Normal common femoral arterial waveforms and velocities. No evidence of inflow (aortoiliac) disease. Outflow: Normal profunda femoral, superficial femoral and popliteal arterial waveforms and velocities. No focal elevation of the PSV to suggest stenosis. Runoff: Normal posterior and anterior tibial arterial waveforms and velocities. Vessels are patent to the ankle. IMPRESSION: Patent bilateral lower extremity arteries with normal waveforms and no evidence of significant atherosclerosis or flow limiting stenosis. Ruthann Cancer, MD [facility]  Electronically Signed   By: Ruthann Cancer MD   On: 07/15/2020 07:42   CT CEREBRAL PERFUSION W CONTRAST  Result Date: 07/16/2020 CLINICAL DATA:  Stroke suspected.  Unable to move right side. EXAM: CT ANGIOGRAPHY HEAD AND NECK CT PERFUSION BRAIN TECHNIQUE: Multidetector CT imaging of the head and neck was performed using the standard protocol during bolus administration of intravenous contrast.  Multiplanar CT image reconstructions and MIPs were obtained to evaluate the vascular anatomy. Carotid stenosis measurements (when applicable) are obtained utilizing NASCET criteria, using the distal internal carotid diameter as the denominator. Multiphase CT imaging of the brain was performed following IV bolus contrast injection. Subsequent parametric perfusion maps were calculated using RAPID software. CONTRAST:  131m OMNIPAQUE IOHEXOL 350 MG/ML SOLN COMPARISON:  CT head 07/10/2020 FINDINGS: CT HEAD FINDINGS Brain: Evaluation is limited by patient motion. There is no definite evidence of acute large vascular territory infarct. There is similar extensive patchy white matter hypoattenuation, most likely the sequela of chronic microvascular ischemic disease. No acute hemorrhage. No abnormal mass effect or mass lesion. No hydrocephalus. Vascular: No hyperdense vessel identified. Calcific atherosclerosis. Skull: No evidence of acute fracture. Sinuses/Orbits: Limit evaluation without evidence of fracture or focal lesion. Other: None. ASPECTS ([facility] Score) Total score (0-10 with 10 being normal): 10 Review of the MIP images confirms the above findings CTA NECK FINDINGS Nondiagnostic arterial evaluation in the upper neck/skull base, including the internal carotid arteries in the neck and mid to distal vertebral arteries. Aortic arch: Calcified atherosclerosis without evidence of significant stenosis or aneurysm. Right carotid system: The visualized common carotid artery is within normal limits without significant stenosis or occlusion. Nondiagnostic evaluation of the distal common carotid artery and internal carotid artery to the level of the petrous carotid. Left carotid system: Calcified and noncalcified atherosclerosis of the common carotid artery without evidence of significant stenosis. Nondiagnostic evaluation of the distal common carotid artery and internal carotid artery to the level of  the petrous carotid. Vertebral arteries: Approximately, no significant stenosis or occlusion. Nondiagnostic evaluation of the mid to distal vertebral arteries. Skeleton: Multilevel severe degenerative change. Other neck: No evidence of mass or adenopathy. Upper chest: Partially imaged fibrotic lung changes, possibly related to the patient's history of sarcoidosis. Review of the MIP images confirms the above findings CTA HEAD FINDINGS Anterior circulation: Calcific atherosclerosis of bilateral cavernous carotids without evidence of greater than 50% narrowing. Bilateral M1 MCAs and proximal M2 MCA is are patent without evidence of significant stenosis. Distal MCA branches appear relatively symmetric. The right A1 ACA is hypoplastic with a large left A1 ACA, likely anatomic variation. Posterior circulation: No significant stenosis or occlusion of the distal V4 vertebral arteries and basilar artery. Fetal type right PCA. No significant stenosis identified of the posterior cerebral arteries. No aneurysm. Venous sinuses: As permitted by contrast timing, patent.ww Review of the MIP images confirms the above findings CT Brain Perfusion Findings: ASPECTS: 10 CBF (<30%) Volume: 067mPerfusion (Tmax>6.0s) volume: 50m70mismatch Volume: 50mL650mlculated T-max greater than 6 sec and mismatch is favored artifactual given location in a region of streak/motion and non vascular territory. IMPRESSION: 1. Overall limited evaluation given patient motion with nondiagnostic arterial evaluation in the upper neck/skull base, including the internal carotid arteries and mid to distal vertebral arteries in the neck. Within this limitation, no evidence of acute intracranial abnormality, large vessel occlusion or significant (greater than 50%) arterial stenosis. Repeat CTA neck could further evaluate if clinically indicated. 2. No convincing  penumbra. Small area of reported mismatch is favored artifactual. 3. Partially imaged fibrotic lung changes,  possibly related to the patient's reported history of sarcoidosis. Code stroke imaging results were communicated on 07/16/2020 at 11:58 am to provider Dr. Doy Mince via telephone, who verbally acknowledged these results. Electronically Signed   By: Margaretha Sheffield MD   On: 07/16/2020 12:17   DG Chest Port 1 View  Result Date: 07/20/2020 CLINICAL DATA:  Acute respiratory failure. EXAM: PORTABLE CHEST 1 VIEW COMPARISON:  07/14/2020 and CT chest 07/10/2020. FINDINGS: Patient is rotated. Trachea is midline. Heart is enlarged, stable. Upper lobe predominant coarsened pulmonary parenchymal markings with relatively low lung volumes. Prominent epicardial fat versus small left pleural effusion. Thoracic vertebral body augmentations. IMPRESSION: 1. Pulmonary parenchymal findings of sarcoid without superimposed acute findings. 2. Prominent epicardial fat versus small left pleural effusion. Electronically Signed   By: Lorin Picket M.D.   On: 07/20/2020 07:47   DG Chest Port 1 View  Result Date: 07/14/2020 CLINICAL DATA:  Intubated EXAM: PORTABLE CHEST 1 VIEW COMPARISON:  07/14/2020, 07/13/2020, CT chest 07/10/2020 FINDINGS: Endotracheal tube tip is about 3 cm superior to the carina. Treated compression deformities of the spine. Bilateral pulmonary fibrosis with hilar retraction and enlargement. Stable cardiomediastinal silhouette. No pneumothorax. IMPRESSION: Endotracheal tube tip about 3 cm superior to the carina. Pulmonary fibrosis. No significant change since radiograph performed earlier today. Electronically Signed   By: Donavan Foil M.D.   On: 07/14/2020 22:06   DG Chest Port 1 View  Result Date: 07/14/2020 CLINICAL DATA:  Intubated EXAM: PORTABLE CHEST 1 VIEW COMPARISON:  07/13/2020, 07/09/2020, CT chest 07/10/2020, chest x-ray 10/26/2017 FINDINGS: Interval intubation, tip of the endotracheal tube is about 3.9 cm superior to the carina. Treated thoracic compression fractures. Bilateral pulmonary fibrosis,  apical dominant compatible with history of sarcoid. Hilar retraction and enlargement. No pneumothorax or pleural effusion. IMPRESSION: 1. Endotracheal tube tip about 3.9 cm superior to the carina. 2. Chronic pulmonary fibrosis with hilar retraction and enlargement. No definite acute interval change compared to most recent prior. Electronically Signed   By: Donavan Foil M.D.   On: 07/14/2020 20:43   DG Chest Port 1 View  Result Date: 07/13/2020 CLINICAL DATA:  Shortness of breath.  History of sarcoidosis. EXAM: PORTABLE CHEST 1 VIEW COMPARISON:  Chest x-ray 07/09/2020, 01/10/201.  9 CT 07/10/2020. FINDINGS: Stable cardiomegaly. Stable bilateral hilar fullness again noted consistent with adenopathy. Bilateral chronic interstitial prominence. Findings consistent with the patient's known sarcoidosis. Superimposed acute interstitial process including pneumonitis cannot be excluded. Left base pleural-parenchymal thickening consistent scarring again noted. No pneumothorax. Prior thoracic vertebroplasties. IMPRESSION: Stable cardiomegaly. Bilateral hilar adenopathy and chronic interstitial prominence consistent with the patient's known sarcoidosis. Superimposed active interstitial process including pneumonitis cannot be excluded. Electronically Signed   By: Marcello Moores  Register   On: 07/13/2020 14:21   DG Chest Port 1 View  Result Date: 07/09/2020 CLINICAL DATA:  Shortness of breath EXAM: PORTABLE CHEST 1 VIEW COMPARISON:  10/26/2017 FINDINGS: Cardiac shadow is stable. Chronic scarring is noted in the apices bilaterally with hilar retraction consistent with the given clinical history of prior sarcoidosis. Patchy increased airspace opacities are noted in the lower lungs which may represent some acute on chronic infiltrate. Changes of prior vertebral augmentation are seen. No acute bony abnormality is noted. IMPRESSION: Changes of scarring consistent with prior sarcoidosis. Mild patchy airspace opacities are noted in the  bases increased from the prior exam. This may represent further fibrotic change although the possibility of  acute on chronic infiltrate deserves consideration. Electronically Signed   By: Inez Catalina M.D.   On: 07/09/2020 23:34   DG OR UROLOGY CYSTO IMAGE ([facility] ONLY)  Result Date: 07/14/2020 There is no interpretation for this exam.  This order is for images obtained during a surgical procedure.  Please See "Surgeries" Tab for more information regarding the procedure.   ECHOCARDIOGRAM COMPLETE  Result Date: 07/13/2020    ECHOCARDIOGRAM REPORT   Patient Name:   Stacie [name] Date of Exam: 07/13/2020 Medical Rec #:  [id number]         Height:       67.0 in Accession #:    [id number]        Weight:       194.9 lb Date of Birth:  04-Aug-1956          BSA:          2.000 m Patient Age:    42 years          BP:           121/75 mmHg Patient Gender: F                 HR:           94 bpm. Exam Location:  [facility] Procedure: 2D Echo, Cardiac Doppler and Color Doppler Indications:     Abnormal ECG 794.31  History:         Patient has no prior history of Echocardiogram examinations.                  Pulmonary HTN; Signs/Symptoms:Murmur. Sarcoidosis.  Sonographer:     Sherrie Sport RDCS (AE) Referring Phys:  [id number] Ottie Glazier Diagnosing Phys: Serafina Royals MD IMPRESSIONS  1. Left ventricular ejection fraction, by estimation, is 30 to 35%. The left ventricle has moderately decreased function. The left ventricle demonstrates global hypokinesis. The left ventricular internal cavity size was mildly dilated. Left ventricular diastolic parameters were normal.  2. Right ventricular systolic function is mildly reduced. The right ventricular size is moderately enlarged. There is moderately elevated pulmonary artery systolic pressure.  3. Left atrial size was moderately dilated.  4. Right atrial size was moderately dilated.  5. The mitral valve is normal in structure. Mild to moderate mitral valve regurgitation.  6. Tricuspid  valve regurgitation is moderate.  7. The aortic valve is normal in structure. Aortic valve regurgitation is trivial. FINDINGS  Left Ventricle: Left ventricular ejection fraction, by estimation, is 30 to 35%. The left ventricle has moderately decreased function. The left ventricle demonstrates global hypokinesis. The left ventricular internal cavity size was mildly dilated. There is no left ventricular hypertrophy. Left ventricular diastolic parameters were normal. Right Ventricle: The right ventricular size is moderately enlarged. No increase in right ventricular wall thickness. Right ventricular systolic function is mildly reduced. There is moderately elevated pulmonary artery systolic pressure. Left Atrium: Left atrial size was moderately dilated. Right Atrium: Right atrial size was moderately dilated. Pericardium: There is no evidence of pericardial effusion. Mitral Valve: The mitral valve is normal in structure. Mild to moderate mitral valve regurgitation. Tricuspid Valve: The tricuspid valve is normal in structure. Tricuspid valve regurgitation is moderate. Aortic Valve: The aortic valve is normal in structure. Aortic valve regurgitation is trivial. Aortic valve mean gradient measures 3.0 mmHg. Aortic valve peak gradient measures 5.3 mmHg. Aortic valve area, by VTI measures 2.00 cm. Pulmonic Valve: The pulmonic valve was normal in structure. Pulmonic valve regurgitation is trivial. Aorta:  The aortic root and ascending aorta are structurally normal, with no evidence of dilitation. IAS/Shunts: No atrial level shunt detected by color flow Doppler.  LEFT VENTRICLE PLAX 2D LVIDd:         4.86 cm     Diastology LVIDs:         3.98 cm     LV e' medial:    3.05 cm/s LV PW:         0.89 cm     LV E/e' medial:  23.5 LV IVS:        0.71 cm     LV e' lateral:   3.59 cm/s LVOT diam:     2.00 cm     LV E/e' lateral: 19.9 LV SV:         38 LV SV Index:   19 LVOT Area:     3.14 cm  LV Volumes (MOD) LV vol d, MOD A2C: 85.2 ml  LV vol d, MOD A4C: 52.0 ml LV vol s, MOD A2C: 52.2 ml LV vol s, MOD A4C: 33.0 ml LV SV MOD A2C:     33.0 ml LV SV MOD A4C:     52.0 ml LV SV MOD BP:      30.6 ml RIGHT VENTRICLE RV Basal diam:  4.28 cm LEFT ATRIUM             Index       RIGHT ATRIUM           Index LA diam:        3.70 cm 1.85 cm/m  RA Area:     18.60 cm LA Vol (A2C):   52.6 ml 26.30 ml/m RA Volume:   52.20 ml  26.10 ml/m LA Vol (A4C):   24.7 ml 12.35 ml/m LA Biplane Vol: 38.6 ml 19.30 ml/m  AORTIC VALVE                   PULMONIC VALVE AV Area (Vmax):    2.00 cm    PV Vmax:        0.50 m/s AV Area (Vmean):   1.93 cm    PV Peak grad:   1.0 mmHg AV Area (VTI):     2.00 cm    RVOT Peak grad: 2 mmHg AV Vmax:           115.00 cm/s AV Vmean:          85.100 cm/s AV VTI:            0.192 m AV Peak Grad:      5.3 mmHg AV Mean Grad:      3.0 mmHg LVOT Vmax:         73.10 cm/s LVOT Vmean:        52.400 cm/s LVOT VTI:          0.122 m LVOT/AV VTI ratio: 0.64  AORTA Ao Root diam: 3.20 cm MITRAL VALVE               TRICUSPID VALVE MV Area (PHT): 3.97 cm    TR Peak grad:   40.7 mmHg MV Decel Time: 191 msec    TR Vmax:        319.00 cm/s MV E velocity: 71.60 cm/s MV A velocity: 84.40 cm/s  SHUNTS MV E/A ratio:  0.85        Systemic VTI:  0.12 m  Systemic Diam: 2.00 cm Serafina Royals MD Electronically signed by Serafina Royals MD Signature Date/Time: 07/13/2020/1:02:19 PM    Final    ECHOCARDIOGRAM COMPLETE BUBBLE STUDY  Result Date: 07/17/2020    ECHOCARDIOGRAM REPORT   Patient Name:   Stacie [name] Date of Exam: 07/16/2020 Medical Rec #:  [id number]         Height:       67.0 in Accession #:    [id number]        Weight:       192.9 lb Date of Birth:  Dec 30, 1955          BSA:          1.991 m Patient Age:    70 years          BP:           120/72 mmHg Patient Gender: F                 HR:           99 bpm. Exam Location:  [facility] Procedure: 2D Echo, Cardiac Doppler, Color Doppler and Saline Contrast Bubble            Study  Indications:     Stroke 434.91  History:         Patient has prior history of Echocardiogram examinations, most                  recent 07/13/2020. Pulmonary HTN, Signs/Symptoms:Murmur; Risk                  Factors:Sleep Apnea.  Sonographer:     Sherrie Sport RDCS (AE) Referring Phys:  [place] Diagnosing Phys: Yolonda Kida MD IMPRESSIONS  1. No evidence of thrombus.  2. Left ventricular ejection fraction, by estimation, is 35 to 40%. The left ventricle has mild to moderately decreased function. The left ventricle demonstrates global hypokinesis. Left ventricular diastolic parameters are consistent with Grade III diastolic dysfunction (restrictive).  3. Right ventricular systolic function is normal. The right ventricular size is normal.  4. The mitral valve is normal in structure. No evidence of mitral valve regurgitation.  5. The aortic valve is grossly normal. Aortic valve regurgitation is not visualized. Conclusion(s)/Recommendation(s): No evidence of valvular vegetations on this transthoracic echocardiogram. Would recommend a transesophageal echocardiogram to exclude infective endocarditis if clinically indicated. Unable to exclude left ventricular thrombus, would recommend a repeat transthoracic echocardiogram with contrast. FINDINGS  Left Ventricle: Left ventricular ejection fraction, by estimation, is 35 to 40%. The left ventricle has mild to moderately decreased function. The left ventricle demonstrates global hypokinesis. The left ventricular internal cavity size was normal in size. There is no left ventricular hypertrophy. Left ventricular diastolic parameters are consistent with Grade III diastolic dysfunction (restrictive). Right Ventricle: The right ventricular size is normal. No increase in right ventricular wall thickness. Right ventricular systolic function is normal. Left Atrium: Left atrial size was normal in size. Right Atrium: Right atrial size was normal in size. Pericardium: There  is no evidence of pericardial effusion. Mitral Valve: The mitral valve is normal in structure. No evidence of mitral valve regurgitation. Tricuspid Valve: The tricuspid valve is normal in structure. Tricuspid valve regurgitation is not demonstrated. Aortic Valve: The aortic valve is grossly normal. Aortic valve regurgitation is not visualized. Aortic valve mean gradient measures 6.0 mmHg. Aortic valve peak gradient measures 10.6 mmHg. Aortic valve area, by VTI measures 1.72 cm. Pulmonic Valve: The pulmonic valve was  normal in structure. Pulmonic valve regurgitation is not visualized. Aorta: The ascending aorta was not well visualized. IAS/Shunts: No atrial level shunt detected by color flow Doppler. Agitated saline contrast was given intravenously to evaluate for intracardiac shunting. Additional Comments: No evidence of thrombus.  LEFT VENTRICLE PLAX 2D LVIDd:         4.36 cm     Diastology LVIDs:         3.64 cm     LV e' medial:    4.90 cm/s LV PW:         1.30 cm     LV E/e' medial:  12.6 LV IVS:        1.31 cm     LV e' lateral:   3.92 cm/s LVOT diam:     2.00 cm     LV E/e' lateral: 15.7 LV SV:         52 LV SV Index:   26 LVOT Area:     3.14 cm  LV Volumes (MOD) LV vol d, MOD A2C: 73.5 ml LV vol d, MOD A4C: 73.9 ml LV vol s, MOD A2C: 43.2 ml LV vol s, MOD A4C: 47.6 ml LV SV MOD A2C:     30.3 ml LV SV MOD A4C:     73.9 ml LV SV MOD BP:      27.4 ml RIGHT VENTRICLE RV Basal diam:  2.71 cm RV S prime:     13.60 cm/s TAPSE (M-mode): 3.1 cm LEFT ATRIUM             Index       RIGHT ATRIUM           Index LA diam:        3.40 cm 1.71 cm/m  RA Area:     12.80 cm LA Vol (A2C):   34.7 ml 17.42 ml/m RA Volume:   27.80 ml  13.96 ml/m LA Vol (A4C):   28.7 ml 14.41 ml/m LA Biplane Vol: 34.0 ml 17.07 ml/m  AORTIC VALVE AV Area (Vmax):    1.99 cm AV Area (Vmean):   1.89 cm AV Area (VTI):     1.72 cm AV Vmax:           163.00 cm/s AV Vmean:          116.500 cm/s AV VTI:            0.301 m AV Peak Grad:      10.6  mmHg AV Mean Grad:      6.0 mmHg LVOT Vmax:         103.00 cm/s LVOT Vmean:        70.100 cm/s LVOT VTI:          0.165 m LVOT/AV VTI ratio: 0.55  AORTA Ao Root diam: 2.80 cm MITRAL VALVE                TRICUSPID VALVE MV Area (PHT): 4.80 cm     TR Peak grad:   23.6 mmHg MV Decel Time: 158 msec     TR Vmax:        243.00 cm/s MV E velocity: 61.60 cm/s MV A velocity: 128.00 cm/s  SHUNTS MV E/A ratio:  0.48         Systemic VTI:  0.16 m                             Systemic Diam: 2.00 cm  Yolonda Kida MD Electronically signed by Yolonda Kida MD Signature Date/Time: 07/17/2020/3:51:35 PM    Final    CT HEAD CODE STROKE WO CONTRAST  Result Date: 07/16/2020 CLINICAL DATA:  Stroke suspected.  Unable to move right side. EXAM: CT ANGIOGRAPHY HEAD AND NECK CT PERFUSION BRAIN TECHNIQUE: Multidetector CT imaging of the head and neck was performed using the standard protocol during bolus administration of intravenous contrast. Multiplanar CT image reconstructions and MIPs were obtained to evaluate the vascular anatomy. Carotid stenosis measurements (when applicable) are obtained utilizing NASCET criteria, using the distal internal carotid diameter as the denominator. Multiphase CT imaging of the brain was performed following IV bolus contrast injection. Subsequent parametric perfusion maps were calculated using RAPID software. CONTRAST:  164m OMNIPAQUE IOHEXOL 350 MG/ML SOLN COMPARISON:  CT head 07/10/2020 FINDINGS: CT HEAD FINDINGS Brain: Evaluation is limited by patient motion. There is no definite evidence of acute large vascular territory infarct. There is similar extensive patchy white matter hypoattenuation, most likely the sequela of chronic microvascular ischemic disease. No acute hemorrhage. No abnormal mass effect or mass lesion. No hydrocephalus. Vascular: No hyperdense vessel identified. Calcific atherosclerosis. Skull: No evidence of acute fracture. Sinuses/Orbits: Limit evaluation without evidence of  fracture or focal lesion. Other: None. ASPECTS ([facility] Score) Total score (0-10 with 10 being normal): 10 Review of the MIP images confirms the above findings CTA NECK FINDINGS Nondiagnostic arterial evaluation in the upper neck/skull base, including the internal carotid arteries in the neck and mid to distal vertebral arteries. Aortic arch: Calcified atherosclerosis without evidence of significant stenosis or aneurysm. Right carotid system: The visualized common carotid artery is within normal limits without significant stenosis or occlusion. Nondiagnostic evaluation of the distal common carotid artery and internal carotid artery to the level of the petrous carotid. Left carotid system: Calcified and noncalcified atherosclerosis of the common carotid artery without evidence of significant stenosis. Nondiagnostic evaluation of the distal common carotid artery and internal carotid artery to the level of the petrous carotid. Vertebral arteries: Approximately, no significant stenosis or occlusion. Nondiagnostic evaluation of the mid to distal vertebral arteries. Skeleton: Multilevel severe degenerative change. Other neck: No evidence of mass or adenopathy. Upper chest: Partially imaged fibrotic lung changes, possibly related to the patient's history of sarcoidosis. Review of the MIP images confirms the above findings CTA HEAD FINDINGS Anterior circulation: Calcific atherosclerosis of bilateral cavernous carotids without evidence of greater than 50% narrowing. Bilateral M1 MCAs and proximal M2 MCA is are patent without evidence of significant stenosis. Distal MCA branches appear relatively symmetric. The right A1 ACA is hypoplastic with a large left A1 ACA, likely anatomic variation. Posterior circulation: No significant stenosis or occlusion of the distal V4 vertebral arteries and basilar artery. Fetal type right PCA. No significant stenosis identified of the posterior cerebral arteries. No  aneurysm. Venous sinuses: As permitted by contrast timing, patent.ww Review of the MIP images confirms the above findings CT Brain Perfusion Findings: ASPECTS: 10 CBF (<30%) Volume: 082mPerfusion (Tmax>6.0s) volume: 63m40mismatch Volume: 63mL73mlculated T-max greater than 6 sec and mismatch is favored artifactual given location in a region of streak/motion and non vascular territory. IMPRESSION: 1. Overall limited evaluation given patient motion with nondiagnostic arterial evaluation in the upper neck/skull base, including the internal carotid arteries and mid to distal vertebral arteries in the neck. Within this limitation, no evidence of acute intracranial abnormality, large vessel occlusion or significant (greater than 50%) arterial stenosis. Repeat CTA neck could further  evaluate if clinically indicated. 2. No convincing penumbra. Small area of reported mismatch is favored artifactual. 3. Partially imaged fibrotic lung changes, possibly related to the patient's reported history of sarcoidosis. Code stroke imaging results were communicated on 07/16/2020 at 11:58 am to provider Dr. Doy Mince via telephone, who verbally acknowledged these results. Electronically Signed   By: Margaretha Sheffield MD   On: 07/16/2020 12:17   US Abdomen Limited RUQ  Result Date: 07/10/2020 CLINICAL DATA:  Elevated liver enzymes EXAM: ULTRASOUND ABDOMEN LIMITED RIGHT UPPER QUADRANT COMPARISON:  CT abdomen and pelvis February 11, 2020 FINDINGS: Gallbladder: No gallstones or wall thickening visualized. There is no pericholecystic fluid. No sonographic Murphy sign noted by sonographer. Common bile duct: Diameter: 5 mm. No intrahepatic or extrahepatic biliary duct dilatation. Liver: No focal lesion identified. Liver echogenicity overall is increased and mildly inhomogeneous. Portal vein is patent on color Doppler imaging with normal direction of blood flow towards the liver. Other: None. IMPRESSION: Liver echogenicity is overall increased and  mildly inhomogeneous. This appearance most likely is indicative of hepatic steatosis, potentially with a degree of underlying parenchymal liver disease. No focal liver lesions evident. Note that the sensitivity of ultrasound for detection of focal liver lesions is somewhat diminished in this circumstance. Study otherwise unremarkable. Electronically Signed   By: Lowella Grip III M.D.   On: 07/10/2020 11:08   Assessment/Plan Lian Charolett Bumpers is a 64 y.o. female with a past medical history significant for pulmonary sarcoidosis (on 2L [place] at baseline), pulmonary hypertension, OSA, asthma, hypertension, hypothyroidism, IBS, and chronic kidney disease who presents to [facility] ED on 07/09/20 due to altered mental status and shortness of breath.    1. Bilateral Lower Extremity Ischemia: Patient with gangrenous changes to bilateral toes.  Left greater than right.  Patient initially treated with heparin however due to possible HIT which was ruled out on July 13, 2020 patient was transitioned to aspirin.  Recommend initiation of heparin.  In the setting of nonpalpable pulses with gangrenous toes discussion in regard to bilateral lower extremity angiogram with the patient and her husband Dr. Kathyrn Sheriff took place.  At this time, the patient and her husband both feel that her physical exam has improved.  At this time, both patient and her husband would like to wait on moving forward with the angiogram.  We will continue to monitor.  2. Hyperlipidemia: Would consider ASA and statin Encouraged good control as its slows the progression of atherosclerotic disease  3. Hypertension: Encouraged good control as its slows the progression of atherosclerotic disease  Seen and examined with Dr. Mayme Genta, PA-C  07/21/2020 9:39 AM  This note was created with Dragon medical transcription system.  Any error is purely unintentional

## 2020-07-21 ENCOUNTER — Inpatient Hospital Stay: Payer: BC Managed Care – PPO

## 2020-07-21 DIAGNOSIS — I70229 Atherosclerosis of native arteries of extremities with rest pain, unspecified extremity: Secondary | ICD-10-CM

## 2020-07-21 DIAGNOSIS — R6521 Severe sepsis with septic shock: Secondary | ICD-10-CM

## 2020-07-21 DIAGNOSIS — I82409 Acute embolism and thrombosis of unspecified deep veins of unspecified lower extremity: Secondary | ICD-10-CM

## 2020-07-21 DIAGNOSIS — A419 Sepsis, unspecified organism: Secondary | ICD-10-CM | POA: Diagnosis not present

## 2020-07-21 DIAGNOSIS — B370 Candidal stomatitis: Secondary | ICD-10-CM

## 2020-07-21 LAB — CBC WITH DIFFERENTIAL/PLATELET
Abs Immature Granulocytes: 0.3 10*3/uL — ABNORMAL HIGH (ref 0.00–0.07)
Basophils Absolute: 0.1 10*3/uL (ref 0.0–0.1)
Basophils Relative: 0 %
Eosinophils Absolute: 0.1 10*3/uL (ref 0.0–0.5)
Eosinophils Relative: 1 %
HCT: 43 % (ref 36.0–46.0)
Hemoglobin: 13.5 g/dL (ref 12.0–15.0)
Immature Granulocytes: 2 %
Lymphocytes Relative: 4 %
Lymphs Abs: 0.8 10*3/uL (ref 0.7–4.0)
MCH: 31.2 pg (ref 26.0–34.0)
MCHC: 31.4 g/dL (ref 30.0–36.0)
MCV: 99.3 fL (ref 80.0–100.0)
Monocytes Absolute: 2.1 10*3/uL — ABNORMAL HIGH (ref 0.1–1.0)
Monocytes Relative: 10 %
Neutro Abs: 17.1 10*3/uL — ABNORMAL HIGH (ref 1.7–7.7)
Neutrophils Relative %: 83 %
Platelets: 159 10*3/uL (ref 150–400)
RBC: 4.33 MIL/uL (ref 3.87–5.11)
RDW: 14.2 % (ref 11.5–15.5)
WBC: 20.6 10*3/uL — ABNORMAL HIGH (ref 4.0–10.5)
nRBC: 0 % (ref 0.0–0.2)

## 2020-07-21 LAB — CBC
HCT: 43.4 % (ref 36.0–46.0)
Hemoglobin: 13.4 g/dL (ref 12.0–15.0)
MCH: 30.7 pg (ref 26.0–34.0)
MCHC: 30.9 g/dL (ref 30.0–36.0)
MCV: 99.5 fL (ref 80.0–100.0)
Platelets: 172 10*3/uL (ref 150–400)
RBC: 4.36 MIL/uL (ref 3.87–5.11)
RDW: 14 % (ref 11.5–15.5)
WBC: 22.8 10*3/uL — ABNORMAL HIGH (ref 4.0–10.5)
nRBC: 0 % (ref 0.0–0.2)

## 2020-07-21 LAB — LIPID PANEL
Cholesterol: 289 mg/dL — ABNORMAL HIGH (ref 0–200)
HDL: 33 mg/dL — ABNORMAL LOW (ref >40)
LDL Cholesterol: 198 mg/dL — ABNORMAL HIGH (ref 0–99)
Total CHOL/HDL Ratio: 8.8 RATIO
Triglycerides: 290 mg/dL — ABNORMAL HIGH (ref <150)
VLDL: 58 mg/dL — ABNORMAL HIGH (ref 0–40)

## 2020-07-21 LAB — HEPATIC FUNCTION PANEL
ALT: 92 U/L — ABNORMAL HIGH (ref 0–44)
AST: 98 U/L — ABNORMAL HIGH (ref 15–41)
Albumin: 2.8 g/dL — ABNORMAL LOW (ref 3.5–5.0)
Alkaline Phosphatase: 71 U/L (ref 38–126)
Bilirubin, Direct: 0.2 mg/dL (ref 0.0–0.2)
Indirect Bilirubin: 0.6 mg/dL (ref 0.3–0.9)
Total Bilirubin: 0.8 mg/dL (ref 0.3–1.2)
Total Protein: 5.9 g/dL — ABNORMAL LOW (ref 6.5–8.1)

## 2020-07-21 LAB — CULTURE, BLOOD (ROUTINE X 2)
Culture: NO GROWTH
Culture: NO GROWTH

## 2020-07-21 LAB — PROTIME-INR
INR: 1 (ref 0.8–1.2)
Prothrombin Time: 13 seconds (ref 11.4–15.2)

## 2020-07-21 LAB — BASIC METABOLIC PANEL
Anion gap: 11 (ref 5–15)
BUN: 41 mg/dL — ABNORMAL HIGH (ref 8–23)
CO2: 29 mmol/L (ref 22–32)
Calcium: 8 mg/dL — ABNORMAL LOW (ref 8.9–10.3)
Chloride: 101 mmol/L (ref 98–111)
Creatinine, Ser: 1.18 mg/dL — ABNORMAL HIGH (ref 0.44–1.00)
GFR calc Af Amer: 56 mL/min — ABNORMAL LOW (ref >60)
GFR calc non Af Amer: 48 mL/min — ABNORMAL LOW (ref >60)
Glucose, Bld: 144 mg/dL — ABNORMAL HIGH (ref 70–99)
Potassium: 4.2 mmol/L (ref 3.5–5.1)
Sodium: 141 mmol/L (ref 135–145)

## 2020-07-21 LAB — GLUCOSE, CAPILLARY
Glucose-Capillary: 135 mg/dL — ABNORMAL HIGH (ref 70–99)
Glucose-Capillary: 141 mg/dL — ABNORMAL HIGH (ref 70–99)
Glucose-Capillary: 173 mg/dL — ABNORMAL HIGH (ref 70–99)
Glucose-Capillary: 173 mg/dL — ABNORMAL HIGH (ref 70–99)
Glucose-Capillary: 231 mg/dL — ABNORMAL HIGH (ref 70–99)

## 2020-07-21 LAB — FIBRIN DERIVATIVES D-DIMER (ARMC ONLY): Fibrin derivatives D-dimer (ARMC): 5349.15 ng/mL (FEU) — ABNORMAL HIGH (ref 0.00–499.00)

## 2020-07-21 LAB — LACTATE DEHYDROGENASE: LDH: 368 U/L — ABNORMAL HIGH (ref 98–192)

## 2020-07-21 LAB — PHOSPHORUS: Phosphorus: 4.2 mg/dL (ref 2.5–4.6)

## 2020-07-21 LAB — MAGNESIUM: Magnesium: 1.8 mg/dL (ref 1.7–2.4)

## 2020-07-21 LAB — FIBRINOGEN: Fibrinogen: 286 mg/dL (ref 210–475)

## 2020-07-21 MED ORDER — INSULIN ASPART 100 UNIT/ML ~~LOC~~ SOLN
0.0000 [IU] | Freq: Three times a day (TID) | SUBCUTANEOUS | Status: DC
  Administered 2020-07-21: 8 [IU] via SUBCUTANEOUS
  Administered 2020-07-21: 2 [IU] via SUBCUTANEOUS
  Administered 2020-07-21 (×2): 3 [IU] via SUBCUTANEOUS
  Administered 2020-07-22: 2 [IU] via SUBCUTANEOUS
  Administered 2020-07-22 (×2): 3 [IU] via SUBCUTANEOUS
  Administered 2020-07-22: 8 [IU] via SUBCUTANEOUS
  Administered 2020-07-23 – 2020-07-24 (×4): 3 [IU] via SUBCUTANEOUS
  Administered 2020-07-24: 5 [IU] via SUBCUTANEOUS
  Administered 2020-07-24: 8 [IU] via SUBCUTANEOUS
  Administered 2020-07-25: 5 [IU] via SUBCUTANEOUS
  Administered 2020-07-25: 3 [IU] via SUBCUTANEOUS
  Administered 2020-07-25: 5 [IU] via SUBCUTANEOUS
  Administered 2020-07-26: 3 [IU] via SUBCUTANEOUS
  Administered 2020-07-26: 5 [IU] via SUBCUTANEOUS
  Administered 2020-07-26: 8 [IU] via SUBCUTANEOUS
  Administered 2020-07-27 (×2): 5 [IU] via SUBCUTANEOUS
  Administered 2020-07-28: 2 [IU] via SUBCUTANEOUS
  Filled 2020-07-21 (×22): qty 1

## 2020-07-21 MED ORDER — ENOXAPARIN SODIUM 40 MG/0.4ML ~~LOC~~ SOLN
40.0000 mg | SUBCUTANEOUS | Status: DC
  Administered 2020-07-21: 40 mg via SUBCUTANEOUS
  Filled 2020-07-21: qty 0.4

## 2020-07-21 MED ORDER — NYSTATIN 100000 UNIT/ML MT SUSP
5.0000 mL | Freq: Four times a day (QID) | OROMUCOSAL | Status: DC
  Administered 2020-07-21 – 2020-07-23 (×6): 500000 [IU] via ORAL
  Filled 2020-07-21 (×7): qty 5

## 2020-07-21 MED ORDER — HEPARIN SODIUM (PORCINE) 5000 UNIT/ML IJ SOLN
5000.0000 [IU] | Freq: Three times a day (TID) | INTRAMUSCULAR | Status: DC
  Administered 2020-07-21 – 2020-07-28 (×21): 5000 [IU] via SUBCUTANEOUS
  Filled 2020-07-21 (×20): qty 1

## 2020-07-21 NOTE — Progress Notes (Signed)
[place], [place] 07/21/20  Subjective:   Hospital day # 11  Patient seen and evaluated the bedside today. Was cleared to drink thin liquids yesterday. Witnessed drinking some water today without coughing. Creatinine currently 1.18. Serum sodium normalized to 141.  Renal: 10/04 0701 - 10/05 0700 In: 1774.8 [I.V.:1174.8] Out: 325 [Urine:325]   Objective:  Vital signs in last 24 hours:  Temp:  [97.5 F (36.4 C)-98.3 F (36.8 C)] 97.5 F (36.4 C) (10/05 0400) Pulse Rate:  [83-107] 92 (10/05 1000) Resp:  [18-38] 26 (10/05 1000) BP: (105-135)/(62-84) 135/62 (10/05 1000) SpO2:  [97 %-100 %] 98 % (10/05 1000)  Weight change:  Filed Weights   07/15/20 0432 07/16/20 0500 07/19/20 0500  Weight: 88.7 kg 87.5 kg 86.3 kg    Intake/Output:    Intake/Output Summary (Last 24 hours) at 07/21/2020 1014 Last data filed at 07/21/2020 0949 Gross per 24 hour  Intake 1774.84 ml  Output 325 ml  Net 1449.84 ml     Physical Exam: General:  No acute distress  HEENT  Normocephalic,atraumatic, hearing intact  Pulm/lungs  mild bilateral wheezing, normal effort  CVS/Heart  S1S2 + regular  Abdomen:   Soft, nondistended  Extremities:  No peripheral edema  Neurologic:  Awake, alert, follows commands  Skin:  No acute rash    Basic Metabolic Panel:  Recent Labs  Lab 07/17/20 0314 07/17/20 0314 07/18/20 0424 07/18/20 0424 07/19/20 0530 07/20/20 0415 07/21/20 0617  NA 154*  --  146*  --  145 148* 141  K 4.4  --  4.3  --  3.8 4.0 4.2  CL 109  --  103  --  102 104 101  CO2 31  --  28  --  33* 32 29  GLUCOSE 234*  --  194*  --  148* 124* 144*  BUN 60*  --  51*  --  41* 40* 41*  CREATININE 1.49*  --  1.32*  --  1.13* 1.19* 1.18*  CALCIUM 7.9*   < > 7.9*   < > 7.5* 8.0* 8.0*  MG 1.9  --  1.9  --  1.7 2.2 1.8  PHOS 2.4*  --  2.7  --  2.7 3.2 4.2   < > = values in this interval not displayed.     CBC: Recent Labs  Lab 07/17/20 0314 07/18/20 0424  07/19/20 0530 07/20/20 0415 07/21/20 0617  WBC 26.0* 22.9* 26.1* 22.3* 20.6*  NEUTROABS 22.2* 19.3* 22.2* 18.9* 17.1*  HGB 13.8 13.8 13.6 14.4 13.5  HCT 45.7 45.3 43.9 45.0 43.0  MCV 100.9* 101.1* 100.2* 96.4 99.3  PLT 76* 83* 97* 139* 159      Lab Results  Component Value Date   HEPBSAG NON REACTIVE 07/15/2020   HEPBIGM NON REACTIVE 07/15/2020      Microbiology:  Recent Results (from the past 240 hour(s))  Culture, blood (Routine X 2) w Reflex to ID Panel     Status: None   Collection Time: 07/16/20  1:29 PM   Specimen: BLOOD  Result Value Ref Range Status   Specimen Description BLOOD LEFT ANTECUBITAL  Final   Special Requests   Final    AEROBIC BOTTLE ONLY Blood Culture results may not be optimal due to an inadequate volume of blood received in culture bottles   Culture   Final    NO GROWTH 5 DAYS Performed at [facility], [address], [place], [place] [address]    Report Status 07/21/2020 FINAL  Final  Culture, blood (Routine X 2) w Reflex to ID Panel     Status: None   Collection Time: 07/16/20  5:09 PM   Specimen: BLOOD  Result Value Ref Range Status   Specimen Description BLOOD BLOOD RIGHT HAND  Final   Special Requests   Final    BOTTLES DRAWN AEROBIC ONLY Blood Culture results may not be optimal due to an inadequate volume of blood received in culture bottles   Culture   Final    NO GROWTH 5 DAYS Performed at [facility], [address], [place], [place] [address]    Report Status 07/21/2020 FINAL  Final    Coagulation Studies: No results for input(s): LABPROT, INR in the last 72 hours.  Urinalysis: No results for input(s): COLORURINE, LABSPEC, PHURINE, GLUCOSEU, HGBUR, BILIRUBINUR, KETONESUR, PROTEINUR, UROBILINOGEN, NITRITE, LEUKOCYTESUR in the last 72 hours.  Invalid input(s): APPERANCEUR    Imaging: MR BRAIN WO CONTRAST  Result Date: 07/20/2020 CLINICAL DATA:  Stroke follow-up. EXAM: MRI HEAD WITHOUT CONTRAST  TECHNIQUE: Multiplanar, multiecho pulse sequences of the brain and surrounding structures were obtained without intravenous contrast. COMPARISON:  MRI of the brain July 16, 2020. FINDINGS: The study is partially degraded by motion. Brain: A few punctate foci of restricted diffusion/T2 hyperintensity are seen within the white matter of the bilateral frontal lobes, suggesting acute/subacute infarcts. No hemorrhage, hydrocephalus, extra-axial collection or mass lesion. Scattered and confluent foci of T2 hyperintensity are seen within the white matter of the cerebral hemispheres and within the pons, nonspecific, most likely related to small vessel ischemia. Vascular: Normal flow voids. Skull and upper cervical spine: Normal marrow signal. Sinuses/Orbits: Bilateral lens surgery.  Paranasal sinuses are clear Other: Left mastoid effusion. IMPRESSION: 1. A few punctate foci of restricted diffusion/T2 hyperintensity within the white matter of the bilateral frontal lobes, suggesting acute/subacute infarcts, likely embolic. 2. Moderate to advanced white matter disease, likely chronic microangiopathy. 3. Left mastoid effusion. Electronically Signed   By: Pedro Earls M.D.   On: 07/20/2020 13:19   DG Chest Port 1 View  Result Date: 07/20/2020 CLINICAL DATA:  Acute respiratory failure. EXAM: PORTABLE CHEST 1 VIEW COMPARISON:  07/14/2020 and CT chest 07/10/2020. FINDINGS: Patient is rotated. Trachea is midline. Heart is enlarged, stable. Upper lobe predominant coarsened pulmonary parenchymal markings with relatively low lung volumes. Prominent epicardial fat versus small left pleural effusion. Thoracic vertebral body augmentations. IMPRESSION: 1. Pulmonary parenchymal findings of sarcoid without superimposed acute findings. 2. Prominent epicardial fat versus small left pleural effusion. Electronically Signed   By: Lorin Picket M.D.   On: 07/20/2020 07:47     Medications:   . cefTRIAXone  (ROCEPHIN)  IV 2 g (07/20/20 1721)  . dextrose 75 mL/hr at 07/21/20 0500   . arformoterol  15 mcg Nebulization BID  . aspirin EC  81 mg Oral Daily  . atorvastatin  40 mg Oral Daily  . budesonide (PULMICORT) nebulizer solution  0.25 mg Nebulization BID  . Chlorhexidine Gluconate Cloth  6 each Topical Q0600  . docusate  100 mg Oral BID  . dorzolamide  1 drop Both Eyes BID  . enoxaparin (LOVENOX) injection  40 mg Subcutaneous Q24H  . escitalopram  20 mg Oral Daily  . feeding supplement (ENSURE ENLIVE)  237 mL Oral TID BM  . insulin aspart  0-15 Units Subcutaneous TID AC & HS  . latanoprost  1 drop Right Eye QHS  . levothyroxine  137 mcg Oral Q0600  . methylPREDNISolone (SOLU-MEDROL) injection  20 mg Intravenous Daily  . multivitamin with minerals  1 tablet Oral Daily  . pantoprazole (PROTONIX) IV  40 mg Intravenous QHS  . polyethylene glycol  17 g Oral Daily  . thiamine injection  100 mg Intravenous Daily  . Treprostinil  18 mcg Inhalation QID   acetaminophen, bisacodyl, docusate sodium, polyethylene glycol  Assessment/ Plan:  64 y.o. female with  sarcoidosis, interstitial lung disease, obstructive sleep apnea, depression, GERD, hyperlipidemia, hypothyroidism   admitted on 07/09/2020 for Elevated LFTs [R79.89] Sepsis ([place]) [A41.9] Sepsis, due to unspecified organism, unspecified whether acute organ dysfunction present [facility]) [A41.9]  # AKI Baseline Cr 1.0 in May 2021 Admi Cr 4.16, peaked at 4.57 -Creatinine remained stable at 1.1.  Continue to monitor renal parameters.  No indication for dialysis. Lab Results  Component Value Date   CREATININE 1.18 (H) 07/21/2020   CREATININE 1.19 (H) 07/20/2020   CREATININE 1.13 (H) 07/19/2020     #Sepsis from urinary source Klebsiella pneumonia and blood in urine Patient has significant history of renal stones.  History of left ureteroscopy May 2021  CT Abd/Pelvis on  07/14/20 IMPRESSION: 7-8 mm proximal right ureteral stone with  obstructive change and perinephric stranding. Urology placed ureteral stents bilaterally on 07/14/20 Hematuria clearing.  #History of pulmonary sarcoidosis, pulmonary hypertension Acute on chronic respiratory failure, community-acquired pneumonia  requires home oxygen and CPAP Was intubated and mechanically ventilated - now extubated -Continues to do well off the ventilator.  Breathing comfortably.  # Hypernatremia Sodium now down to 141.  Decrease D5W to 40 cc/h.    LOS: 11 Evaleen Sant 10/5/202110:14 AM  [place], [place]  Note: This note was prepared with Dragon dictation. Any transcription errors are unintentional

## 2020-07-21 NOTE — Progress Notes (Signed)
[place] for Electrolyte Monitoring and Replacement   Recent Labs: Potassium (mmol/L)  Date Value  07/21/2020 4.2  12/28/2012 3.4 (L)   Magnesium (mg/dL)  Date Value  07/21/2020 1.8   Calcium (mg/dL)  Date Value  07/21/2020 8.0 (L)   Calcium, Total (mg/dL)  Date Value  12/28/2012 8.0 (L)   Albumin (g/dL)  Date Value  07/21/2020 2.8 (L)  05/27/2019 4.1  12/26/2012 3.0 (L)   Phosphorus (mg/dL)  Date Value  07/21/2020 4.2   Sodium (mmol/L)  Date Value  07/21/2020 141  05/27/2019 144  12/28/2012 142   Corrected Ca: 8.96 mg/dL  Assessment: Pharmacy consulted to replace electrolytes in this 64 year old female admitted with K pneumoniae bacteremia. Pt with PMH of Stage 3 CKD and baseline Scr of 1.0 in May 2021. Nephrology is following.  MIVF: 5% dextrose at 40 mL/hr  Goal of Therapy:  Electrolytes WNL   Plan:   No electrolyte replacement warranted today  F/u electrolytes with am labs  Dallie Piles, PharmD 07/21/2020 7:29 AM

## 2020-07-21 NOTE — Progress Notes (Signed)
Patient resting off and on overnight, no complaints of discomfort. Patient remains oriented to self and place overnight. VSS, see [facility] for further documentation.

## 2020-07-21 NOTE — Progress Notes (Addendum)
Subjective: Reports feeling worn out, no acute complaints  Exam: Vitals:   07/21/20 1600 07/21/20 1810  BP: 125/86 126/64  Pulse:  96  Resp: (!) 26 16  Temp: 97.8 F (36.6 C) 98 F (36.7 C)  SpO2: 95% 97%   Gen: In bed, NAD Resp: non-labored breathing, no acute distress Abd: soft, nt Extremities: Mottled feet with significant purple discoloration L > R  Neuro: MS: Awake, alert, oriented to month, year, location and situation but initially states her daughter is in her 51s (she is 70), no motor apraxia on complex finger position testing CN: Visual fields full, EOMI, tongue midline, sensation intact, face symmetric, uvula elevates symmetrically Motor: She has a right > left, distal > proximal paresis. She is able to follow commands in all four extremities, but frequently requires significant coaching on strength examination due to fatigue  Sensory: She endorses symmetric sensation  Pertinent Labs: LDH 368 LDL 198, Cholesterol 289, VLDL 58, Triglycerides 290 HDL 33  A1c 6.4% on admission  Platelet count has recovered   Repeat MRI brain 10/5 personally reviewed, stable, no evidence of hemorrhagic conversion    Impression: 64 year old female with a right greater than left paresis that occurred in the setting of severe sepsis. Her attention/orientation is improving though it remains difficult to fully assess her motor progression given some element of poor effort due to fatigue.  Given her MRI today does not show any new lesions and given the SWI sequences do not show any evidence of hemorrhagic conversion, and given that Klebsiella does not typically result in endocarditis, I am less concerned that she has ongoing septic emboli.  In discussion with critical care medicine team, there is some concern that she may have experienced DIC leading to her prior thrombocytopenia which is now recovering, as her HIT panel was negative.  DIC could certainly explain the pattern of strokes seen on her  MRI.   Recommendations: -Defer decision for need for TEE to infectious disease  -Appreciate continued excellent care of her medical comorbidities -Continue aspirin 81 mg indefinitely -Continue atorvastatin 40 mg nightly Continue to follow delirium precautions:   - try to minimize deliriogenic medications as much as possible (J Am Geriatr Soc. 2012 Apr;60(4):616-31): benzodiazepines, anticholinergics, diphenhydramine, antihistamines, narcotics, Ambien/Lunesta/Sonata etc. - environmental support for delirium: Lights on during the day, patient up and out of bed as much as is feasible, OT/PT, quiet dimly lit room at night, reorient patient often, provide hearing aides and glasses if patient uses them routinely, minimize sleep disruptions as much as possible overnight.  As much as possible, reorient patient, and have them engage patient in activities, e.g. playing cards. TV should be off or on neutral background music unless patient engaged and watching. Try to keep interactions with the patient calm and quiet.     Lesleigh Noe MD-PhD [facility] [phone]  If 8pm- 8am, please page neurology on call as listed in [place].

## 2020-07-21 NOTE — Plan of Care (Signed)
  Problem: Health Behavior/Discharge Planning: Goal: Ability to manage health-related needs will improve Outcome: Progressing   Problem: Clinical Measurements: Goal: Ability to maintain clinical measurements within normal limits will improve Outcome: Progressing Goal: Will remain free from infection Outcome: Progressing Goal: Diagnostic test results will improve Outcome: Progressing Goal: Respiratory complications will improve Outcome: Progressing Goal: Cardiovascular complication will be avoided Outcome: Progressing

## 2020-07-21 NOTE — Progress Notes (Signed)
Inpatient Rehab Admissions Coordinator:   I am continuing to follow for potential CIR admit. Pt. Remains in critical care and has not participated much with PT. I will hold on requesting insurance authorizations until  Pt.'s notes reflect progress with therapies.  Clemens Catholic, [place], [place] Admissions Coordinator  [phone] ([place]) [phone] (office)

## 2020-07-21 NOTE — Plan of Care (Signed)
  Problem: Health Behavior/Discharge Planning: Goal: Ability to manage health-related needs will improve Outcome: Progressing   Problem: Clinical Measurements: Goal: Ability to maintain clinical measurements within normal limits will improve Outcome: Progressing Goal: Will remain free from infection Outcome: Progressing Goal: Diagnostic test results will improve Outcome: Progressing Goal: Respiratory complications will improve Outcome: Progressing Goal: Cardiovascular complication will be avoided Outcome: Progressing   Problem: Activity: Goal: Risk for activity intolerance will decrease Outcome: Progressing   Problem: Nutrition: Goal: Adequate nutrition will be maintained Outcome: Progressing   Problem: Coping: Goal: Level of anxiety will decrease Outcome: Progressing   Problem: Elimination: Goal: Will not experience complications related to bowel motility Outcome: Progressing Goal: Will not experience complications related to urinary retention Outcome: Progressing   Problem: Pain Managment: Goal: General experience of comfort will improve Outcome: Progressing

## 2020-07-21 NOTE — Consult Note (Addendum)
Physical Medicine and Rehabilitation Consult Reason for Consult: Impaired mobility and ADLs secondary to  Referring Physician: Dr. Vernard Gambles, MD  HPI: Stacie [name] is a 64 y.o. female with a PMH of pulmonary sarcoidosis(on 2L [place] at baseline), pulmonary HTN, OSA, asthma, HTN, hypothyroidism, IBS, and CKD, who presented to the [facility] ED on 9/23 for AMS and SOB. She had AMS for 1 day and associated generalized weakness and diarrhea. She has received 2 doses of the COVID vaccine and a Booster dose. She was found to have a UTI with Klebsiella bacteremia and associated encephalopathy requiring BiPAP. She was found to have ischemic toes on 9/27. Vascular surgery was consulted for latter. On 9/28 she was found to have elevated LFTs, RUQ [place] was ordered, and ID was consulted. She was found to be hypernatremic on 9/29 with Na>150; D5W was started and patient was extubated. Code stroke was called on 9/30 for dysarthria, right hemiparesis, and right facial drooping. MRI brain shows acute and subacute embolic infarcts. Physical Medicine & Rehabilitation was consulted to assess candidacy for CIR.   Review of Systems  Constitutional: Negative.  Negative for chills and fever.  HENT: Negative.   Eyes: Negative.   Respiratory: Positive for cough.   Cardiovascular: Negative.   Gastrointestinal: Negative.   Genitourinary: Negative.   Musculoskeletal: Negative.   Skin: Negative.   Neurological: Positive for weakness.  Endo/Heme/Allergies: Negative.   Psychiatric/Behavioral: Negative.    Past Medical History:  Diagnosis Date  . Arrhythmia    patient unaware if this is current  . Asthma   . GERD (gastroesophageal reflux disease)   . Heart murmur   . History of kidney stones   . HOH (hard of hearing)    wear aids  . Hypothyroidism   . IBS (irritable bowel syndrome)   . Pulmonary hypertension ([place])   . Sarcoid   . Sarcoidosis   . Seasonal allergies   . Sleep apnea CPAP with O2  . Wears  hearing aid in both ears    Past Surgical History:  Procedure Laterality Date  . CARDIAC CATHETERIZATION  10/18/2018   [facility]  . CATARACT EXTRACTION W/PHACO Left 07/31/2019   Procedure: CATARACT EXTRACTION PHACO AND INTRAOCULAR LENS PLACEMENT (IOC) LEFT 00:51.1  17.9%  9.15;  Surgeon: Leandrew Koyanagi, MD;  Location: [place];  Service: Ophthalmology;  Laterality: Left;  keep this patient second  . COLON SURGERY     "colon was fused to bladder - operated on both"  . COLONOSCOPY  09/18/2007   diverticuli, no polyps  . COLONOSCOPY  05/26/2010   diverticuli, no polyps  . CYSTOSCOPY WITH STENT PLACEMENT Bilateral 07/14/2020   Procedure: CYSTOSCOPY WITH STENT PLACEMENT, RETROPYLOGRAM;  Surgeon: Billey Co, MD;  Location: [facility];  Service: Urology;  Laterality: Bilateral;  . CYSTOSCOPY/URETEROSCOPY/HOLMIUM LASER/STENT PLACEMENT Left 02/20/2020   Procedure: CYSTOSCOPY/URETEROSCOPY/LITHOTRIPSY /STENT PLACEMENT;  Surgeon: Hollice Espy, MD;  Location: [facility];  Service: Urology;  Laterality: Left;  . PARS PLANA VITRECTOMY Right 05/20/2015   Procedure: PARS PLANA VITRECTOMY WITH 25 GAUGE, laser;  Surgeon: Milus Height, MD;  Location: [facility];  Service: Ophthalmology;  Laterality: Right;  . PARTIAL HYSTERECTOMY  1990  . TUBAL LIGATION     Family History  Problem Relation Age of Onset  . Allergies Father   . Asthma Father   . Colon cancer Father   . Allergies Brother   . Asthma Brother   . Breast cancer Maternal Grandmother    Social  History:  reports that she has never smoked. She has never used smokeless tobacco. She reports that she does not drink alcohol and does not use drugs. Allergies:  Allergies  Allergen Reactions  . Corn-Containing Products Diarrhea    headaches  . Nitrofurantoin Nausea Only  . Tramadol     Nausea and vomiting  . Sulfa Antibiotics Rash    As an infant   Medications Prior to Admission  Medication Sig Dispense Refill  .  budesonide-formoterol (SYMBICORT) 80-4.5 MCG/ACT inhaler Take 2 puffs first thing in am and then another 2 puffs about 12 hours later. (Patient taking differently: Inhale 2 puffs into the lungs in the morning and at bedtime. Take 2 puffs first thing in am and then another 2 puffs about 12 hours later.) 1 Inhaler 11  . denosumab (PROLIA) 60 MG/ML SOSY injection Inject 60 mg into the skin every 6 (six) months.    . dorzolamide (TRUSOPT) 2 % ophthalmic solution Place 1 drop into both eyes 2 (two) times daily.    Marland Kitchen escitalopram (LEXAPRO) 20 MG tablet     . folic acid (FOLVITE) 1 MG tablet Take 1 mg by mouth daily.  11  . latanoprost (XALATAN) 0.005 % ophthalmic solution Place 1 drop into the right eye at bedtime.    . methotrexate (TREXALL) 5 MG tablet TAKE 5 TABLETS (25 MG TOTAL) BY MOUTH EVERY 7 (SEVEN) DAYS    . Multiple Vitamins-Minerals (MULTIVITAMIN WITH MINERALS) tablet Take 1 tablet by mouth daily.    . NON FORMULARY CPAP nightly    . predniSONE (DELTASONE) 10 MG tablet Take 10 mg by mouth daily with breakfast. 12.5 mg    . SYNTHROID 137 MCG tablet TAKE 1 TABLET (137 MCG TOTAL) BY MOUTH DAILY BEFORE BREAKFAST. 90 tablet 0  . Treprostinil (TYVASO) 0.6 MG/ML SOLN Inhale 18 mcg into the lungs 4 (four) times daily. 12 breaths 4 times a day    . albuterol (VENTOLIN HFA) 108 (90 Base) MCG/ACT inhaler Inhale 2 puffs into the lungs every 6 (six) hours as needed for wheezing. (Patient not taking: Reported on 07/10/2020) 1 Inhaler 2  . brimonidine (ALPHAGAN) 0.2 % ophthalmic solution Place 1 drop into both eyes 3 (three) times daily.  (Patient not taking: Reported on 05/12/2020)    . calcium carbonate (TUMS - DOSED IN MG ELEMENTAL CALCIUM) 500 MG chewable tablet Chew 1 tablet by mouth daily as needed for indigestion or heartburn. (Patient not taking: Reported on 07/10/2020)    . cephALEXin (KEFLEX) 500 MG capsule Take 1 capsule (500 mg total) by mouth 3 (three) times daily. (Patient not taking: Reported on  06/12/2020) 21 capsule 0  . conjugated estrogens (PREMARIN) vaginal cream Apply one pea-sized amount around the opening of the urethra three times weekly. (Patient not taking: Reported on 07/10/2020) 30 g 3  . methotrexate (RHEUMATREX) 2.5 MG tablet Take by mouth. (Patient not taking: Reported on 07/10/2020)    . mirabegron ER (MYRBETRIQ) 25 MG TB24 tablet Take 1 tablet (25 mg total) by mouth daily. (Patient not taking: Reported on 07/10/2020) 30 tablet 11  . oxybutynin (DITROPAN) 5 MG tablet  (Patient not taking: Reported on 07/10/2020)    . oxyCODONE-acetaminophen (PERCOCET/ROXICET) 5-325 MG tablet  (Patient not taking: Reported on 05/12/2020)    . OXYGEN Inhale 2 L into the lungs as needed.    Marland Kitchen Respiratory Therapy Supplies (FLUTTER) DEVI Use as directed (Patient not taking: Reported on 07/10/2020) 1 each 0  . VITAMIN D PO Take 1  tablet by mouth once a week. (Patient not taking: Reported on 07/10/2020)    . Vitamin D, Ergocalciferol, (DRISDOL) 1.25 MG (50000 UNIT) CAPS capsule Take 50,000 Units by mouth once a week. (Patient not taking: Reported on 06/12/2020)      Home: Home Living Family/patient expects to be discharged to:: Private residence Living Arrangements: Spouse/significant other Available Help at Discharge: Family Type of Home: House Home Access: Stairs to enter Technical brewer of Steps: 2 (2) Entrance Stairs-Rails: None Home Layout: Two level, Able to live on main level with bedroom/bathroom Alternate Level Stairs-Number of Steps: 15 (15) Alternate Level Stairs-Rails: Left Bathroom Shower/Tub: Tub/shower unit, Estate agent Accessibility: Yes Additional Comments: Patient limited historian; will verify with family as available  Lives With: Spouse  Functional History: Prior Function Level of Independence: Independent with assistive device(s) Comments: Indep with ADLs, household and limited community mobilization; home O2 at  2L. Functional Status:  Mobility: Bed Mobility Overal bed mobility: Needs Assistance Bed Mobility: Rolling Rolling: Total assist, Max assist, +2 for safety/equipment General bed mobility comments: Max assist x2 for shifting towards head of bed, pt declined any further mobility Transfers Overall transfer level: Needs assistance General transfer comment: pt declined, suspect heavy level of assist required Ambulation/Gait General Gait Details: deferred secondary to patient request for bedpan; unable to complete due to weakness    ADL: ADL Overall ADL's : Needs assistance/impaired General ADL Comments: OTR provided max/hand over hand assist for grooming/feeding tasks at bedlevel.  Pt remains total assist for lower body dressing and toileting at bedlevel.  Cognition: Cognition Overall Cognitive Status: Within Functional Limits for tasks assessed Orientation Level: Oriented to person, Oriented to place, Oriented to situation Cognition Arousal/Alertness: Awake/alert Behavior During Therapy: WFL for tasks assessed/performed Overall Cognitive Status: Within Functional Limits for tasks assessed Area of Impairment: Memory, Following commands, Safety/judgement, Awareness, Problem solving Memory: Decreased short-term memory Following Commands: Follows one step commands inconsistently Safety/Judgement: Decreased awareness of deficits Problem Solving: Slow processing, Decreased initiation, Difficulty sequencing, Requires verbal cues, Requires tactile cues General Comments: Pt A&O x4.  She continues to require intermittent min verbal and tactile cues to support following one-step commands.  Delayed processing. Difficult to assess due to:  (Pt answers inconsistently)  Blood pressure 135/62, pulse 94, temperature 98.1 F (36.7 C), temperature source Oral, resp. rate 20, height 5' 7.01" (1.702 m), weight 86.3 kg, SpO2 100 %. Physical Exam  General: Alert and oriented x 3, No apparent  distress HEENT: Head is normocephalic, [place] in place Neck: Supple without JVD or lymphadenopathy Heart: Reg rate and rhythm. No murmurs rubs or gallops Chest: CTA bilaterally without wheezes, rales, or rhonchi; no distress Abdomen: Soft, non-tender, non-distended, bowel sounds positive. Extremities: No clubbing, cyanosis, or edema. Pulses are 2+ Skin: Gangrenous changes to bilateral toes, scattered bruising throughout arms. Neuro: Difficulty answering basic history questions. Alert and oriented x2 (says she is at [facility]). Cranial nerves 2-12 are intact. Sensory exam is normal. Reflexes are 2+ in all 4's. Fine motor coordination is intact. No tremors. Motor function is grossly 5/5 in upper extremities and 4-/5 in her lower extremities Psych: Pt's affect is appropriate. Pt is cooperative   Results for orders placed or performed during the hospital encounter of 07/09/20 (from the past 24 hour(s))  Glucose, capillary     Status: Abnormal   Collection Time: 07/20/20  4:29 PM  Result Value Ref Range   Glucose-Capillary 177 (H) 70 - 99 mg/dL  Glucose, capillary  Status: Abnormal   Collection Time: 07/20/20  8:15 PM  Result Value Ref Range   Glucose-Capillary 206 (H) 70 - 99 mg/dL  Glucose, capillary     Status: Abnormal   Collection Time: 07/21/20  3:31 AM  Result Value Ref Range   Glucose-Capillary 135 (H) 70 - 99 mg/dL  CBC with Differential/Platelet     Status: Abnormal   Collection Time: 07/21/20  6:17 AM  Result Value Ref Range   WBC 20.6 (H) 4.0 - 10.5 K/uL   RBC 4.33 3.87 - 5.11 MIL/uL   Hemoglobin 13.5 12.0 - 15.0 g/dL   HCT 43.0 36 - 46 %   MCV 99.3 80.0 - 100.0 fL   MCH 31.2 26.0 - 34.0 pg   MCHC 31.4 30.0 - 36.0 g/dL   RDW 14.2 11.5 - 15.5 %   Platelets 159 150 - 400 K/uL   nRBC 0.0 0.0 - 0.2 %   Neutrophils Relative % 83 %   Neutro Abs 17.1 (H) 1.7 - 7.7 K/uL   Lymphocytes Relative 4 %   Lymphs Abs 0.8 0.7 - 4.0 K/uL   Monocytes Relative 10 %   Monocytes Absolute  2.1 (H) 0 - 1 K/uL   Eosinophils Relative 1 %   Eosinophils Absolute 0.1 0 - 0 K/uL   Basophils Relative 0 %   Basophils Absolute 0.1 0 - 0 K/uL   Immature Granulocytes 2 %   Abs Immature Granulocytes 0.30 (H) 0.00 - 0.07 K/uL  Phosphorus     Status: None   Collection Time: 07/21/20  6:17 AM  Result Value Ref Range   Phosphorus 4.2 2.5 - 4.6 mg/dL  Magnesium     Status: None   Collection Time: 07/21/20  6:17 AM  Result Value Ref Range   Magnesium 1.8 1.7 - 2.4 mg/dL  Basic metabolic panel     Status: Abnormal   Collection Time: 07/21/20  6:17 AM  Result Value Ref Range   Sodium 141 135 - 145 mmol/L   Potassium 4.2 3.5 - 5.1 mmol/L   Chloride 101 98 - 111 mmol/L   CO2 29 22 - 32 mmol/L   Glucose, Bld 144 (H) 70 - 99 mg/dL   BUN 41 (H) 8 - 23 mg/dL   Creatinine, Ser 1.18 (H) 0.44 - 1.00 mg/dL   Calcium 8.0 (L) 8.9 - 10.3 mg/dL   GFR calc non Af Amer 48 (L) >60 mL/min   GFR calc Af Amer 56 (L) >60 mL/min   Anion gap 11 5 - 15  Lipid panel     Status: Abnormal   Collection Time: 07/21/20  6:17 AM  Result Value Ref Range   Cholesterol 289 (H) 0 - 200 mg/dL   Triglycerides 290 (H) <150 mg/dL   HDL 33 (L) >40 mg/dL   Total CHOL/HDL Ratio 8.8 RATIO   VLDL 58 (H) 0 - 40 mg/dL   LDL Cholesterol 198 (H) 0 - 99 mg/dL  Hepatic function panel     Status: Abnormal   Collection Time: 07/21/20  6:17 AM  Result Value Ref Range   Total Protein 5.9 (L) 6.5 - 8.1 g/dL   Albumin 2.8 (L) 3.5 - 5.0 g/dL   AST 98 (H) 15 - 41 U/L   ALT 92 (H) 0 - 44 U/L   Alkaline Phosphatase 71 38 - 126 U/L   Total Bilirubin 0.8 0.3 - 1.2 mg/dL   Bilirubin, Direct 0.2 0.0 - 0.2 mg/dL   Indirect Bilirubin 0.6 0.3 -  0.9 mg/dL  Glucose, capillary     Status: Abnormal   Collection Time: 07/21/20  7:41 AM  Result Value Ref Range   Glucose-Capillary 141 (H) 70 - 99 mg/dL  CBC     Status: Abnormal   Collection Time: 07/21/20 11:54 AM  Result Value Ref Range   WBC 22.8 (H) 4.0 - 10.5 K/uL   RBC 4.36 3.87 -  5.11 MIL/uL   Hemoglobin 13.4 12.0 - 15.0 g/dL   HCT 43.4 36 - 46 %   MCV 99.5 80.0 - 100.0 fL   MCH 30.7 26.0 - 34.0 pg   MCHC 30.9 30.0 - 36.0 g/dL   RDW 14.0 11.5 - 15.5 %   Platelets 172 150 - 400 K/uL   nRBC 0.0 0.0 - 0.2 %   MR BRAIN WO CONTRAST  Result Date: 07/20/2020 CLINICAL DATA:  Stroke follow-up. EXAM: MRI HEAD WITHOUT CONTRAST TECHNIQUE: Multiplanar, multiecho pulse sequences of the brain and surrounding structures were obtained without intravenous contrast. COMPARISON:  MRI of the brain July 16, 2020. FINDINGS: The study is partially degraded by motion. Brain: A few punctate foci of restricted diffusion/T2 hyperintensity are seen within the white matter of the bilateral frontal lobes, suggesting acute/subacute infarcts. No hemorrhage, hydrocephalus, extra-axial collection or mass lesion. Scattered and confluent foci of T2 hyperintensity are seen within the white matter of the cerebral hemispheres and within the pons, nonspecific, most likely related to small vessel ischemia. Vascular: Normal flow voids. Skull and upper cervical spine: Normal marrow signal. Sinuses/Orbits: Bilateral lens surgery.  Paranasal sinuses are clear Other: Left mastoid effusion. IMPRESSION: 1. A few punctate foci of restricted diffusion/T2 hyperintensity within the white matter of the bilateral frontal lobes, suggesting acute/subacute infarcts, likely embolic. 2. Moderate to advanced white matter disease, likely chronic microangiopathy. 3. Left mastoid effusion. Electronically Signed   By: Pedro Earls M.D.   On: 07/20/2020 13:19   DG Chest Port 1 View  Result Date: 07/20/2020 CLINICAL DATA:  Acute respiratory failure. EXAM: PORTABLE CHEST 1 VIEW COMPARISON:  07/14/2020 and CT chest 07/10/2020. FINDINGS: Patient is rotated. Trachea is midline. Heart is enlarged, stable. Upper lobe predominant coarsened pulmonary parenchymal markings with relatively low lung volumes. Prominent epicardial  fat versus small left pleural effusion. Thoracic vertebral body augmentations. IMPRESSION: 1. Pulmonary parenchymal findings of sarcoid without superimposed acute findings. 2. Prominent epicardial fat versus small left pleural effusion. Electronically Signed   By: Lorin Picket M.D.   On: 07/20/2020 07:47    Assessment/Plan: Diagnosis: Encephalopathy and sepsis secondary to UTI.  1. Does the need for close, 24 hr/day medical supervision in concert with the patient's rehab needs make it unreasonable for this patient to be served in a less intensive setting? Yes 2. Co-Morbidities requiring supervision/potential complications: gangrenous toes, overweight (BMI 29.79), pulmonary HTN, OSA, asthma, HTN, hypothyroidism, IBS, CKD, sarcoidosis, acute on chronic hypoxic respiratory failure, community acquired pneumonia 3. Due to bladder management, bowel management, safety, skin/wound care, disease management, medication administration, pain management and patient education, does the patient require 24 hr/day rehab nursing? Yes 4. Does the patient require coordinated care of a physician, rehab nurse, therapy disciplines of PT, OT, SLP to address physical and functional deficits in the context of the above medical diagnosis(es)? Yes Addressing deficits in the following areas: balance, endurance, locomotion, strength, transferring, bowel/bladder control, bathing, dressing, feeding, grooming, toileting, cognition, swallowing and psychosocial support 5. Can the patient actively participate in an intensive therapy program of at least 3 hrs of therapy  per day at least 5 days per week? Yes 6. The potential for patient to make measurable gains while on inpatient rehab is good 7. Anticipated functional outcomes upon discharge from inpatient rehab are min assist  with PT, min assist with OT, supervision with SLP. 8. Estimated rehab length of stay to reach the above functional goals is: 2-3 weeks 9. Anticipated discharge  destination: Home 10. Overall Rehab/Functional Prognosis: good  RECOMMENDATIONS: This patient's condition is appropriate for continued rehabilitative care in the following setting: CIR Patient has agreed to participate in recommended program. Yes Note that insurance prior authorization may be required for reimbursement for recommended care.  Comment: Thank you for this consult. Admission coordinator to follow.   I have personally performed a face to face diagnostic evaluation, including, but not limited to relevant history and physical exam findings, of this patient and developed relevant assessment and plan.  Additionally, I have reviewed and concur with the physician assistant's documentation above.  Izora Ribas, MD 07/21/2020

## 2020-07-21 NOTE — Progress Notes (Signed)
NAME:  Stacie [name], MRN:  [id number], DOB:  03/13/1956, LOS: 49 ADMISSION DATE:  07/09/2020, CONSULTATION DATE:  07/10/2020 REFERRING MD:  Dr. Owens Shark, CHIEF COMPLAINT:  AMS & shortness of breath  Brief History   64 y.o. Female admitted with Sepsis and Acute on Chronic Hypoxic Respiratory Failure in the setting of Community Acquired Pneumonia and Acute Exacerbation of Pulmonary Sarcoidosis requiring BiPAP.   Past Medical History  OSA- home CPAP Sarcoidosis Pulmonary HTN IBS Hypothyroidism GERD Asthma Arrhythmia  Significant Hospital Events   9/23: Presented to ED, placed on BiPAP 9/24: PCCM asked to admit 9/25: Klebsiella pneumoniae bacteremia due to UTI, associated encephalopathy 9/26: Persistent encephalopathy 9/27: Oozing blood from peripheral veins and both LE toes are with ischemic changes.  9/28- LFTs abnormal: [place] RUQ, ID consulted 07/15/20- hypernatremic >150, will start D5w, Extubated 07/16/20- Code stroke called d/t dysrarthria, right hemiparesis and right facial drooping. MRI/CTA brain and EEG.  07/17/20- Aphasia/right sided weakness. Failed swallow. Neuro following 07/18/20- Repeat swallow evaluation- dysphagia diet instituted 07/19/20- Improved articulation, able to participate with PT. 07/20/20- Vascular & podiatry consulted due to worsening ischemic changes in bilateral feet. Repeat MRI  Consults:  Nephrology Neurology Infectious Disease Podiatry Vascular  Procedures:   9/24 ETT >> 9/29  Significant Diagnostic Tests:  9/24: CT Head>>Chronic white matter ischemic changes without acute abnormality 9/24: CT Chest w/o Contrast>>Chronic changes of pulmonary sarcoidosis with biapical predominant pulmonary fibrosis. Very mild superimposed active inflammatory infiltrate. Morphologic changes compatible with pulmonary arterial hypertension. 9/30: Echo bubble study >> No evidence of thrombus, LVEF is 35 to 40%. Left ventricle has mild to moderately decreased function and  global hypokinesis. Left ventricular diastolic parameters are consistent with Grade III diastolic dysfunction (restrictive). 9/30: EEG >> General background slowing with rare triphasic waves noted.  This finding may be seen with a diffuse cerebral disturbance that is etiologically nonspecific, but may include a metabolic encephalopathy, among other possibilities.  No epileptiform activity was noted.   10/4 MRI w/o contrast >> suggestive of acute/subacute infarcts, likely embolic. Left mastoid effusion, moderate to advanced white matter disease  Micro Data:  COVID-19 9/23 >> negative Influenza A/B 9/23 >> negative BC 9/23 >> positive (Enterobacterales & Klebsiella) MRSA PCR 9/24 >> negative UC 9/23 >> positive (Klebsiella) Aspergillus BAL 9/24 >> negative BC 9/30 >> negative  Antimicrobials:  9/23 Azithromycin x 1 dose 9/23 Ceftazidime x 1 dose 9/24 Ceftriaxone >>  Interim history/subjective:  Patient resting comfortably in bed, no c/o pain, stating she "slept well overnight"   Overnight- no issued per care RN Labs/Imaging personally reviewed  Na+- 141 BUN/Cr: 41/ 1.18 AST/ ALT: 98/ 92 TMAX / WBC: 36.8  / 20.6  Objective   Blood pressure 134/71, pulse 83, temperature (!) 97.5 F (36.4 C), resp. rate (!) 29, height 5' 7.01" (1.702 m), weight 86.3 kg, SpO2 100 %.        Intake/Output Summary (Last 24 hours) at 07/21/2020 0726 Last data filed at 07/21/2020 0500 Gross per 24 hour  Intake 1774.84 ml  Output 325 ml  Net 1449.84 ml   Filed Weights   07/15/20 0432 07/16/20 0500 07/19/20 0500  Weight: 88.7 kg 87.5 kg 86.3 kg    Examination: General: Adult female, sitting in bed, NAD HEENT: MM pink/moist, anicteric Neuro: Alert and responsive, oriented x 4 with some delayed responses, R hemiparesis CV: s1s2, RRR, NSR on monitor, +1 L pedal/ +2 R pedal, +2 Bilateral radials, no m/r/g Pulm: regular, non labored on 1 L , breath  sounds coarse throughout  GI: soft, non tender, bs  x 4 Skin: scattered ecchymosis, intact, no rashes/ lesions noted Extremities: warm/ dry, no edema noted  Resolved Hospital Problem list     Assessment & Plan:  Acute on Chronic Hypoxic Respiratory Failure secondary to acute Exacerbation of Pulmonary Sarcoidosis/ALI  - Supplemental O2 PRN, maintain SpO2 > 94% - Intermittent CXR - Consider discontinuing Ceftriaxone as patient has completed 10 day course - Continue Solumedrol 20 mg QD - continue LABA/ICS: Arformoterol & Budesonide - Continue Treprostinil as tolerated - Continue home CPAP QHS  Acute metabolic encephalopathy-resolved Due to severe sepsis/gram-negative bacteremia- klebsiella.  Present on admission d/t UTI  - continue Thiamine - continue synthroid  Acute CVA 9/30 Code stroke called d/t dysrarthria, right hemiparesis and right facial drooping. MRI/CTA brain and EEG. 10/4- repeat MRI showed acute/subacute infarcts in the bilateral frontal lobes, likely embolic in nature. TEE considered, holding off until patient is more stable- per husband 10/5  - R hemiparesis continues with some mild aphasia- improving - neurology following, appreciate input - PT/OT/ SLP working with patient, mobilize - order placed to evaluate cognitive linguistic function - Heparin SQ Q 8 started per neurology, no systemic anticoagulation at this time - continue aspirin & atorvastatin - Consider TEE in future to rule out endocarditis  Severe sepsis secondary to [place] pneumoniae bactermia-improved  UTI, recurrent  - ID following, appreciate input - Ceftriaxone to be continued until 10/10 per ID - trend WBC/fever curve  Obstructing calculi of right kidney and nonobstructing calculi of left ureter.  S/p surgical decompression with stent placement 9/28. Repeat CTabd/pelvis - no concerning findings to elucidate reason for worsening leukocytosis-07/17/20  - f/u with Urology outpatient for ureteroscopy, laser lithotripsy and stent exchange -  trial foley removal, as we are 7 days out from stent placement  AKI - continues to improve -07/21/20 Anion Gap Metabolic Acidosis, corrected. Baseline Cr. 1.0 (02/2019)  - continue D5w at 75 mL/h, per nephrology, hypernatremia improved - Strict I/O's - Daily BMET, monitor Na+ - avoid nephrotoxic agents, ensure adequate renal perfusion - replace electrolytes PRN - Nephrology following, appreciate input  Ischemia- Bilateral lower extremities Necrotic Left 2nd, 3rd toes with mottling discoloration on bilateral feet. HIIT panel negative- 9/27, question regarding possible DIC. Bilateral pedal pulses intact- 10/4. Vascular suggests LLE angiogram, on hold per husband until patient more stable.  - Regular neurovascular checks, alert MD if pulses are lost - Podiatry & vascular surgery consulted, appreciate input - Consider future LLE angiogram  - DIC panel ordered, will follow  Transaminitis- improving Abdominal US showed liver echogenicity indicative of hepatic steatosis  - Trend LFT's  Best practice:  Diet: DYS diet- per SLP Pain/Anxiety/Delirium protocol (if indicated): N/A VAP protocol (if indicated): N/A DVT prophylaxis: SCD's & ASA GI prophylaxis: protonix Glucose control: Q 4 SSI, target range 140-180 Mobility: mobilize as tolerated- PT following Code Status: FULL Family Communication: Husband updated bedside MD- 10/5 Disposition: SDU  Labs   CBC: Recent Labs  Lab 07/17/20 0314 07/18/20 0424 07/19/20 0530 07/20/20 0415 07/21/20 0617  WBC 26.0* 22.9* 26.1* 22.3* 20.6*  NEUTROABS 22.2* 19.3* 22.2* 18.9* 17.1*  HGB 13.8 13.8 13.6 14.4 13.5  HCT 45.7 45.3 43.9 45.0 43.0  MCV 100.9* 101.1* 100.2* 96.4 99.3  PLT 76* 83* 97* 139* 484    Basic Metabolic Panel: Recent Labs  Lab 07/17/20 0314 07/18/20 0424 07/19/20 0530 07/20/20 0415 07/21/20 0617  NA 154* 146* 145 148* 141  K 4.4 4.3 3.8 4.0  4.2  CL 109 103 102 104 101  CO2 31 28 33* 32 29  GLUCOSE 234* 194*  148* 124* 144*  BUN 60* 51* 41* 40* 41*  CREATININE 1.49* 1.32* 1.13* 1.19* 1.18*  CALCIUM 7.9* 7.9* 7.5* 8.0* 8.0*  MG 1.9 1.9 1.7 2.2 1.8  PHOS 2.4* 2.7 2.7 3.2 4.2   GFR: Estimated Creatinine Clearance: 53.7 mL/min (A) (by C-G formula based on SCr of 1.18 mg/dL (H)). Recent Labs  Lab 07/18/20 0424 07/19/20 0530 07/20/20 0415 07/21/20 0617  WBC 22.9* 26.1* 22.3* 20.6*    Liver Function Tests: Recent Labs  Lab 07/16/20 0930 07/17/20 0314 07/21/20 0617  AST 54* 60* 98*  ALT 157* 125* 92*  ALKPHOS 71 76 71  BILITOT 0.9 1.0 0.8  PROT 5.9* 5.9* 5.9*  ALBUMIN 2.6* 2.8* 2.8*   No results for input(s): LIPASE, AMYLASE in the last 168 hours. No results for input(s): AMMONIA in the last 168 hours.  ABG    Component Value Date/Time   PHART 7.41 07/16/2020 0841   PCO2ART 52 (H) 07/16/2020 0841   PO2ART 76 (L) 07/16/2020 0841   HCO3 33.0 (H) 07/16/2020 0841   TCO2 29 02/20/2020 1156   ACIDBASEDEF 0.5 07/12/2020 1205   O2SAT 95.2 07/16/2020 0841     Coagulation Profile: Recent Labs  Lab 07/14/20 1334  INR 1.3*    Cardiac Enzymes: No results for input(s): CKTOTAL, CKMB, CKMBINDEX, TROPONINI in the last 168 hours.  HbA1C: Hgb A1c MFr Bld  Date/Time Value Ref Range Status  07/16/2020 07:06 AM 6.4 (H) 4.8 - 5.6 % Final    Comment:    (NOTE) Pre diabetes:          5.7%-6.4%  Diabetes:              >6.4%  Glycemic control for   <7.0% adults with diabetes   04/20/2017 09:09 AM 6.0 (H) 4.8 - 5.6 % Final    Comment:             Pre-diabetes: 5.7 - 6.4          Diabetes: >6.4          Glycemic control for adults with diabetes: <7.0     CBG: Recent Labs  Lab 07/20/20 0717 07/20/20 1140 07/20/20 1629 07/20/20 2015 07/21/20 0331  GLUCAP 104* 169* 177* 206* 135*       Critical care time:     Domingo Pulse Rust[place], AGACNP-BC [facility]    Please see Amion for pager details.

## 2020-07-21 NOTE — Progress Notes (Signed)
ID C/o painful swallowing Hoarse voice  Patient Vitals for the past 24 hrs:  BP Temp Temp src Pulse Resp SpO2  07/21/20 1810 126/64 98 F (36.7 C) Oral 96 16 97 %  07/21/20 1600 125/86 97.8 F (36.6 C) Oral -- (!) 26 95 %  07/21/20 1430 120/61 -- -- 94 (!) 23 99 %  07/21/20 1230 135/69 97.7 F (36.5 C) Oral 95 (!) 35 100 %  07/21/20 1227 135/69 -- -- 95 (!) 30 100 %  07/21/20 1159 -- -- -- 94 20 100 %  07/21/20 1000 135/62 -- -- 92 (!) 26 98 %  07/21/20 0800 125/66 98.1 F (36.7 C) Oral -- 17 --  07/21/20 0722 -- -- -- 79 (!) 28 94 %  07/21/20 0400 134/71 (!) 97.5 F (36.4 C) -- 83 (!) 29 100 %  07/21/20 0200 131/74 -- -- -- (!) 38 --  07/21/20 0000 125/69 -- -- -- (!) 25 --  07/20/20 2100 -- 97.9 F (36.6 C) Oral 90 (!) 35 100 %  07/20/20 2049 -- -- -- -- -- 100 %  07/20/20 2000 111/67 -- -- -- (!) 25 --    O/e awake and alert Oral cavity- severe candidiasis of pharyngeal space Chest b/l air entry- crepts b/l HS- s1s2 abd soft Feet-        Labs CBC Latest Ref Rng & Units 07/21/2020 07/21/2020 07/20/2020  WBC 4.0 - 10.5 K/uL 22.8(H) 20.6(H) 22.3(H)  Hemoglobin 12.0 - 15.0 g/dL 13.4 13.5 14.4  Hematocrit 36 - 46 % 43.4 43.0 45.0  Platelets 150 - 400 K/uL 172 159 139(L)   CMP Latest Ref Rng & Units 07/21/2020 07/20/2020 07/19/2020  Glucose 70 - 99 mg/dL 144(H) 124(H) 148(H)  BUN 8 - 23 mg/dL 41(H) 40(H) 41(H)  Creatinine 0.44 - 1.00 mg/dL 1.18(H) 1.19(H) 1.13(H)  Sodium 135 - 145 mmol/L 141 148(H) 145  Potassium 3.5 - 5.1 mmol/L 4.2 4.0 3.8  Chloride 98 - 111 mmol/L 101 104 102  CO2 22 - 32 mmol/L 29 32 33(H)  Calcium 8.9 - 10.3 mg/dL 8.0(L) 8.0(L) 7.5(L)  Total Protein 6.5 - 8.1 g/dL 5.9(L) - -  Total Bilirubin 0.3 - 1.2 mg/dL 0.8 - -  Alkaline Phos 38 - 126 U/L 71 - -  AST 15 - 41 U/L 98(H) - -  ALT 0 - 44 U/L 92(H) - -   Micro 07/09/20- Kleb pneumo blood culture 07/06/20 BC NG  MRI brain A few punctate foci of restricted diffusion/T2 hyperintensity within  the white matter of the bilateral frontal lobes, suggesting acute/subacute infarcts, likely embolic. Moderate to advanced white matter disease, likely chronic microangiopathy. 3. Left mastoid effusion  Impression/recommendation  Oropharyngeal candidiasis - pt prefers Nystatin to fluconazole   New encephalopathy and slurred speech- resolved stroke questioned - repeat MRI 07/20/20 showed b/l frontal lobe subacute infarctt questioning emboli Ideally will need TEE/carotid doppler  Klebsiella bacteremia with sepsis due to UTI with rt pyelonpehritis due to rt obstructing ureteric stone causing hydo S/p stenton 07/15/20 No fever in 5 days Repeat blood culturefrom 9/30 -No growtrh Plan is to Continue ceftriaxone until 07/26/20 but if emboli to brain is questioned will need TEE to direct the duration of antibiotic   Ischemia of the feet-left > rt- could be the reason for persistent leucocytosis Family wants to hold off for now any intervention   Hypernatremia- much better  Severe thrombocytopenia--resolved  Abnormal lfts- could be methotrexate, sepsis-improving  AKi- on CKD- improving  Pulmonary sarcoidosis on methylprednsione (  was on methotrexate at home) home oxygen  H/o left ureteric stone s/p lithotripsy and stent in May 2021 - stent removed after that H/o Over active bladder, recurrent UTI  Discussed the management with patient and her daughter

## 2020-07-21 NOTE — Progress Notes (Signed)
PT Cancellation Note  Patient Details Name: DISA RIEDLINGER MRN: [id number] DOB: [birth year]   Cancelled Treatment:    Reason Eval/Treat Not Completed:  (Treatment session attempted.  Patient just finished with lab draw, now lunch arrived to room.  Will continue efforts at later time/date as medically appropriate.)   Navea Woodrow H. Owens Shark, PT, DPT, NCS 07/21/20, 4:10 PM [phone]

## 2020-07-21 NOTE — Progress Notes (Signed)
Pt A&OX4. Intermittently confused about time. In no acute distress but verbalizes being very tired. MRI this shift. Toes appear cyanotic bilaterally. Pedal pulses 1+ bilaterally. Pt appears symmetrical with clear speech. Per daughter wears 2L O2 at home. Tolerating diet orders. Foley removed. Called report to 2A RN. Will transfer to floor via bed.

## 2020-07-22 DIAGNOSIS — I70229 Atherosclerosis of native arteries of extremities with rest pain, unspecified extremity: Secondary | ICD-10-CM

## 2020-07-22 DIAGNOSIS — A414 Sepsis due to anaerobes: Secondary | ICD-10-CM

## 2020-07-22 DIAGNOSIS — I6340 Cerebral infarction due to embolism of unspecified cerebral artery: Secondary | ICD-10-CM

## 2020-07-22 DIAGNOSIS — N179 Acute kidney failure, unspecified: Secondary | ICD-10-CM

## 2020-07-22 DIAGNOSIS — I2699 Other pulmonary embolism without acute cor pulmonale: Secondary | ICD-10-CM

## 2020-07-22 LAB — GLUCOSE, CAPILLARY
Glucose-Capillary: 195 mg/dL — ABNORMAL HIGH (ref 70–99)
Glucose-Capillary: 155 mg/dL — ABNORMAL HIGH (ref 70–99)
Glucose-Capillary: 134 mg/dL — ABNORMAL HIGH (ref 70–99)
Glucose-Capillary: 159 mg/dL — ABNORMAL HIGH (ref 70–99)
Glucose-Capillary: 266 mg/dL — ABNORMAL HIGH (ref 70–99)
Glucose-Capillary: 212 mg/dL — ABNORMAL HIGH (ref 70–99)

## 2020-07-22 LAB — CBC WITH DIFFERENTIAL/PLATELET
Abs Immature Granulocytes: 0.39 10*3/uL — ABNORMAL HIGH (ref 0.00–0.07)
Basophils Absolute: 0.1 10*3/uL (ref 0.0–0.1)
Basophils Relative: 0 %
Eosinophils Absolute: 0 10*3/uL (ref 0.0–0.5)
Eosinophils Relative: 0 %
HCT: 40.9 % (ref 36.0–46.0)
Hemoglobin: 12.8 g/dL (ref 12.0–15.0)
Immature Granulocytes: 2 %
Lymphocytes Relative: 3 %
Lymphs Abs: 0.7 10*3/uL (ref 0.7–4.0)
MCH: 30.5 pg (ref 26.0–34.0)
MCHC: 31.3 g/dL (ref 30.0–36.0)
MCV: 97.4 fL (ref 80.0–100.0)
Monocytes Absolute: 2 10*3/uL — ABNORMAL HIGH (ref 0.1–1.0)
Monocytes Relative: 9 %
Neutro Abs: 18.5 10*3/uL — ABNORMAL HIGH (ref 1.7–7.7)
Neutrophils Relative %: 86 %
Platelets: 198 10*3/uL (ref 150–400)
RBC: 4.2 MIL/uL (ref 3.87–5.11)
RDW: 14 % (ref 11.5–15.5)
WBC: 21.6 10*3/uL — ABNORMAL HIGH (ref 4.0–10.5)
nRBC: 0 % (ref 0.0–0.2)

## 2020-07-22 LAB — BASIC METABOLIC PANEL
Anion gap: 13 (ref 5–15)
BUN: 40 mg/dL — ABNORMAL HIGH (ref 8–23)
CO2: 28 mmol/L (ref 22–32)
Calcium: 8.4 mg/dL — ABNORMAL LOW (ref 8.9–10.3)
Chloride: 102 mmol/L (ref 98–111)
Creatinine, Ser: 1.18 mg/dL — ABNORMAL HIGH (ref 0.44–1.00)
GFR calc non Af Amer: 48 mL/min — ABNORMAL LOW (ref >60)
Glucose, Bld: 152 mg/dL — ABNORMAL HIGH (ref 70–99)
Potassium: 4.5 mmol/L (ref 3.5–5.1)
Sodium: 143 mmol/L (ref 135–145)

## 2020-07-22 LAB — HAPTOGLOBIN: Haptoglobin: 193 mg/dL (ref 37–355)

## 2020-07-22 LAB — MAGNESIUM: Magnesium: 1.7 mg/dL (ref 1.7–2.4)

## 2020-07-22 LAB — PHOSPHORUS: Phosphorus: 4 mg/dL (ref 2.5–4.6)

## 2020-07-22 MED ORDER — MAGNESIUM SULFATE 2 GM/50ML IV SOLN
2.0000 g | Freq: Once | INTRAVENOUS | Status: AC
  Administered 2020-07-22: 2 g via INTRAVENOUS
  Filled 2020-07-22: qty 50

## 2020-07-22 MED ORDER — NYSTATIN 100000 UNIT/GM EX POWD
1.0000 "application " | Freq: Three times a day (TID) | CUTANEOUS | Status: DC
  Administered 2020-07-22 – 2020-07-28 (×17): 1 via TOPICAL
  Filled 2020-07-22 (×2): qty 15

## 2020-07-22 MED ORDER — ACETAMINOPHEN 325 MG PO TABS
650.0000 mg | ORAL_TABLET | ORAL | Status: DC | PRN
  Administered 2020-07-22 – 2020-07-26 (×3): 650 mg via ORAL
  Filled 2020-07-22 (×3): qty 2

## 2020-07-22 MED ORDER — OXYBUTYNIN CHLORIDE ER 5 MG PO TB24
5.0000 mg | ORAL_TABLET | Freq: Every day | ORAL | Status: DC
  Administered 2020-07-22 – 2020-07-24 (×3): 5 mg via ORAL
  Filled 2020-07-22 (×3): qty 1

## 2020-07-22 NOTE — Progress Notes (Signed)
Occupational Therapy Treatment Patient Details Name: Stacie [name] MRN: [id number] DOB: 1956/07/31 Today's Date: 07/22/2020    History of present illness Stacie [name] is a 64 y.o. Female with a past medical history significant for pulmonary sarcoidosis (on 2L [place] at baseline), pulmonary hypertension, OSA, asthma, hypertension, hypothyroidism, IBS, and chronic kidney disease who presents to [facility] ED on 07/09/20 due to altered mental status and shortness of breath.  Admitted for management of acute hypoxic respiratory failure and sepsis related to CAP, UTI.  Hospital course significant for periods of delirium requiring precedex infusion; questionable DIC with ischemic changes to L foot, R great toe; cystoscopy with pyelogram (9/28); extubation (9/29); and acute code stroke (9/30) due to new-onset slurred speech, R hemiparesis (NIHSS 16).   OT comments  Upon entering the room, pt supine in bed with daughter and RN present. Pt reports urgency for toileting needs and rolls with mod A to place bedpan. After urinating, pt able to perform hygiene with encouragement. Pt rolls and therapist removes pan with pt remaining in L side lying position. Supine >sit with max A for trunk and B LEs. Pt sitting for ~ 9 minutes with min A for static sitting balance. Pt appears anxious about attempting to stand but is encouraged. On standing attempt, pt appears to be putting forth very little effort through B LEs and therapist unable to lift pt with total A. OT set up equipment for squat pivot into reclined chair but pt declines and requests to lay back down. Min +2 assistance to reposition pt in bed. Pt did endorse feeling dizzy while seated on EOB and placed into chair position once returning to bed. Pt fatigues very quickly and needing increased motivation this session. If pt is able to progress and increase participation CIR will continue to be appropriate.   Follow Up Recommendations  CIR    Equipment Recommendations   Other (comment) (defer to next venue of care)       Precautions / Restrictions Precautions Precautions: Fall Restrictions Weight Bearing Restrictions: No       Mobility Bed Mobility Overal bed mobility: Needs Assistance Bed Mobility: Rolling Rolling: Mod assist         General bed mobility comments: mod A rolling L <> R with for bed pan  Transfers Overall transfer level: Needs assistance     General transfer comment: attempt made with pt giving no effort and buttocks not clearing bed.    Balance Overall balance assessment: Needs assistance Sitting-balance support: Single extremity supported;Feet supported Sitting balance-Leahy Scale: Poor Sitting balance - Comments: min A static sitting balance       Standing balance comment: pt unable to stand          ADL either performed or assessed with clinical judgement   ADL Overall ADL's : Needs assistance/impaired         Vision Patient Visual Report: No change from baseline            Cognition Arousal/Alertness: Awake/alert Behavior During Therapy: WFL for tasks assessed/performed Overall Cognitive Status: Impaired/Different from baseline Area of Impairment: Memory;Following commands;Safety/judgement;Awareness;Problem solving      Memory: Decreased short-term memory Following Commands: Follows one step commands inconsistently;Follows one step commands with increased time Safety/Judgement: Decreased awareness of deficits   Problem Solving: Slow processing;Decreased initiation;Difficulty sequencing;Requires verbal cues;Requires tactile cues General Comments: Pt needing min- mod multimodal cuing for technique, initiation, and sequencing.  Slow processing requiring increased time  Pertinent Vitals/ Pain       Pain Assessment: No/denies pain  Home Living     Available Help at Discharge: Family Type of Home: House        Lives With: Spouse        Frequency  Min 2X/week         Progress Toward Goals  OT Goals(current goals can now be found in the care plan section)  Progress towards OT goals: Progressing toward goals  Acute Rehab OT Goals Patient Stated Goal: to get moving/EOB OT Goal Formulation: With patient/family Time For Goal Achievement: 08/01/20 Potential to Achieve Goals: [place] Discharge plan remains appropriate;Frequency remains appropriate       AM-PAC OT "6 Clicks" Daily Activity     Outcome Measure   Help from another person eating meals?: A Lot Help from another person taking care of personal grooming?: A Lot Help from another person toileting, which includes using toliet, bedpan, or urinal?: Total Help from another person bathing (including washing, rinsing, drying)?: Total Help from another person to put on and taking off regular upper body clothing?: Total Help from another person to put on and taking off regular lower body clothing?: Total 6 Click Score: 8    End of Session Equipment Utilized During Treatment: Oxygen (2L via [place])  OT Visit Diagnosis: Other abnormalities of gait and mobility (R26.89);Muscle weakness (generalized) (M62.81)   Activity Tolerance Patient limited by fatigue   Patient Left in bed;with call bell/phone within reach;with family/visitor present   Nurse Communication Mobility status;Precautions;Need for lift equipment        Time: 9191-6606 OT Time Calculation (min): 33 min  Charges: OT General Charges $OT Visit: 1 Visit OT Treatments $Therapeutic Activity: 23-37 mins  Darleen Crocker, MS, OTR/L , CBIS ascom [phone]  07/22/20, 1:23 PM

## 2020-07-22 NOTE — Progress Notes (Signed)
Patient ID: Stacie [name], female   DOB: Oct 12, 1956, 64 y.o.   MRN: [id number]  Dr Curly Shores spoke with Dr. Kathyrn Sheriff and ID Dr. Steva Ready and will plan for cardiology Dr. Ubaldo Glassing to perform TEE later this week or early next week. Till then will continue IV antibiotics. TEEE is done to rule out endocarditis given embolic stroke and ischemic foot. Dr Ubaldo Glassing aware of consult.

## 2020-07-22 NOTE — Progress Notes (Signed)
[place] for Electrolyte Monitoring and Replacement   Recent Labs: Potassium (mmol/L)  Date Value  07/22/2020 4.5  12/28/2012 3.4 (L)   Magnesium (mg/dL)  Date Value  07/22/2020 1.7   Calcium (mg/dL)  Date Value  07/22/2020 8.4 (L)   Calcium, Total (mg/dL)  Date Value  12/28/2012 8.0 (L)   Albumin (g/dL)  Date Value  07/21/2020 2.8 (L)  05/27/2019 4.1  12/26/2012 3.0 (L)   Phosphorus (mg/dL)  Date Value  07/22/2020 4.0   Sodium (mmol/L)  Date Value  07/22/2020 143  05/27/2019 144  12/28/2012 142   Corrected Ca: 8.96 mg/dL  Assessment: Pharmacy consulted to replace electrolytes in this 64 year old female admitted with K pneumoniae bacteremia. Pt with PMH of Stage 3 CKD and baseline Scr of 1.0 in May 2021. Nephrology is following.  MIVF: 5% dextrose at 40 mL/hr  Mg  1.7- mild hypomagnesemia   Goal of Therapy:  Electrolytes WNL   Plan:   Will order Mg 2g x1 dose.  F/u electrolytes with am labs  Rowland Lathe, PharmD 07/22/2020 7:28 AM

## 2020-07-22 NOTE — Progress Notes (Signed)
[facility] NAME: Stacie [name]    MR#:  [id number]  DATE OF BIRTH:  04-12-1956  SUBJECTIVE:  patient working with occupational therapy. Daughter in the room. She got transferred out of ICU yesterday. Trying to drink ensure little bit. Appetite not the best according to the daughter. Little bit of wheezing earlier per daughter. Patient denies any pain in her feet. No fever Had BM per RN REVIEW OF SYSTEMS:   Review of Systems  Constitutional: Positive for malaise/fatigue. Negative for chills, fever and weight loss.  HENT: Negative for ear discharge, ear pain and nosebleeds.   Eyes: Negative for blurred vision, pain and discharge.  Respiratory: Positive for wheezing. Negative for sputum production, shortness of breath and stridor.   Cardiovascular: Negative for chest pain, palpitations, orthopnea and PND.  Gastrointestinal: Negative for abdominal pain, diarrhea, nausea and vomiting.  Genitourinary: Negative for frequency and urgency.  Musculoskeletal: Negative for back pain and joint pain.  Neurological: Positive for weakness. Negative for sensory change, speech change and focal weakness.  Psychiatric/Behavioral: Negative for depression and hallucinations. The patient is not nervous/anxious.    Tolerating Diet:some Tolerating PT: recommending CIR  DRUG ALLERGIES:   Allergies  Allergen Reactions  . Corn-Containing Products Diarrhea    headaches  . Nitrofurantoin Nausea Only  . Tramadol     Nausea and vomiting  . Sulfa Antibiotics Rash    As an infant    VITALS:  Blood pressure 130/62, pulse 90, temperature 98 F (36.7 C), temperature source Oral, resp. rate 17, height 5' 7.01" (1.702 m), weight 86.3 kg, SpO2 100 %.  PHYSICAL EXAMINATION:   Physical Exam  GENERAL:  64 y.o.-year-old patient lying in the bed with no acute distress.  HEENT: Head atraumatic, normocephalic. Oropharynx and nasopharynx clear.  NECK:   Supple, no jugular venous distention. No thyroid enlargement, no tenderness.  LUNGS: distant breath sounds bilaterally, no wheezing, rales, rhonchi. No use of accessory muscles of respiration.  CARDIOVASCULAR: S1, S2 normal. No murmurs, rubs, or gallops.  ABDOMEN: Soft, nontender, nondistended. Bowel sounds present. No organomegaly or mass.  EXTREMITIES:    Cool to touch bilateral feet. Feeble pedal pulses. NEUROLOGIC: cranial nerves grossly intact. No focal weakness. Generalized weakness present.  PSYCHIATRIC:  patient is alert and oriented x 2.  SKIN: as above  LABORATORY PANEL:  CBC Recent Labs  Lab 07/22/20 0304  WBC 21.6*  HGB 12.8  HCT 40.9  PLT 198    Chemistries  Recent Labs  Lab 07/21/20 0617 07/21/20 0617 07/22/20 0304  NA 141   < > 143  K 4.2   < > 4.5  CL 101   < > 102  CO2 29   < > 28  GLUCOSE 144*   < > 152*  BUN 41*   < > 40*  CREATININE 1.18*   < > 1.18*  CALCIUM 8.0*   < > 8.4*  MG 1.8   < > 1.7  AST 98*  --   --   ALT 92*  --   --   ALKPHOS 71  --   --   BILITOT 0.8  --   --    < > = values in this interval not displayed.   Cardiac Enzymes No results for input(s): TROPONINI in the last 168 hours. RADIOLOGY:  MR BRAIN WO CONTRAST  Result Date: 07/21/2020 CLINICAL DATA:  64 year old female code stroke presentation on 07/16/2020. Several motion degraded exams, but diagnostic  MRI yesterday suggesting a few punctate foci of white matter restricted diffusion such as due to small acute or subacute embolic infarcts. Repeat DWI, FLAIR and SWI imaging requested today. EXAM: MRI HEAD WITHOUT CONTRAST TECHNIQUE: Multiplanar, multiecho pulse sequences of the brain and surrounding structures were obtained without intravenous contrast. COMPARISON:  Brain MRI 07/20/2020 and earlier. FINDINGS: Study is intermittently degraded by motion artifact despite repeated imaging attempts. Brain: No intracranial mass effect or ventriculomegaly. Stable cerebral morphology since  yesterday. Widespread, confluent cerebral white matter T2 and FLAIR hyperintensity again noted. Similar moderate T2 and FLAIR heterogeneity in the pons. Repeat DWI imaging is of slightly lesser quality than the yesterday. There remain only small vague areas of abnormal trace diffusion (such as in the right corona radiata series 5, image 29 and right parietal subcortical white matter on image 30). No new or progressive diffusion abnormality. SWI imaging is compared to T2 * imaging yesterday. No evidence of acute or chronic intracranial blood products. Vascular: Major intracranial vascular flow voids appear stable. Other: Stable mild mastoid effusion. Visible nasopharynx remains negative. IMPRESSION: 1. Unchanged DWI from yesterday with small, vague foci of trace diffusion abnormality suggesting scattered small foci of subacute white matter ischemia. Underlying advanced but nonspecific chronic signal changes in the cerebral white matter and the pons redemonstrated, most commonly due to small vessel disease. 2. Negative SWI imaging. 3. No new intracranial abnormality is evident. Electronically Signed   By: Genevie Ann M.D.   On: 07/21/2020 15:26   ASSESSMENT AND PLAN:  Stacie [name] is a 64 y.o. Female with a past medical history significant for pulmonary sarcoidosis (on 2L [place] at baseline), pulmonary hypertension, OSA, asthma, hypertension, hypothyroidism, IBS, and chronic kidney disease who presents to [facility] ED on 07/09/20 due to altered mental status and shortness of breath.    Severe sepsis secondary to Klebsiella pneumonia bacteremia/UTI recurrent -repeat cultures negative -patient to be continued on Rocephin till 07/26/20 -appreciate ID input -monitor fever and WBC  Right kidney obstructive calculi and non- obstructing calculi of the left ureter -status post surgical decompression with stent placement on 07/14/20 -patient will follow-up with urology as outpatient for ureteral scopic, laser lithotripsy and  stent exchange -Foley now removed patient urinating well  Acute kidney injury in the setting of severe sepsis -patient had come in with creatinine of 4.5---1.18 -urine output adequate -appreciate nephrology input  Bilateral lower extremity ischemic foot-? Embolic -necrotic left second third toes with modeling discoloration of bilateral feet -HID panel negative -?dic related -vascular consultation with Dr. Lucky Cowboy-- recommends angiogram-- on hold per family request still patient more stable  Acute on chronichypoxic respiratory failure secondary to acute exacerbation of pulmonary sarcoidosis/acute lung injury -she initially was placed on BiPAP then intubated and now extubated transferred out of ICU [phone] -currently on 2 L nasal cannula oxygen. Patient uses chronically at home. -Continue Solu-Medrol 20 mg Q day -- continue LABA/ICS: Arformoterol & Budesonide - Continue Treprostinil as tolerated - Continue home CPAP QHS  Acute metabolic encephalopathy-- improved suspected due to gram-negative/Klebsiella severe sepsis source urine -continue supportive care  Acute CVA-- embolic in the setting of severe sepsis  -10/4--MRI brain showed acute subacute infarcts and bilateral frontal lobes likely embolic -TEE considered-- husband wants to hold since patient is improving -continue PT OT -continue aspirin, atorvastatin -neurology recommend subcu heparin TID. No systemic anticoagulation at this time. -Consider TEE in future   Family communication : daughter in the room Consults : ID, pulmonary, vascular, podiatry CODE STATUS: full  DVT Prophylaxis : heparin  Status is: Inpatient  Remains inpatient appropriate because:Inpatient level of care appropriate due to severity of illness   Dispo: The patient is from: Home              Anticipated d/c is to: CIR              Anticipated d/c date is: > 3 days              Patient currently is not medically stable to d/c.  Patient just  transferred out of ICU yesterday. She is working with physical therapy. She is continued on IV antibiotics. Appears to be malnourished and poor PO intake with multiple medical issues.      TOTAL TIME TAKING CARE OF THIS PATIENT: 25 minutes.  >50% time spent on counselling and coordination of care  Note: This dictation was prepared with Dragon dictation along with smaller phrase technology. Any transcriptional errors that result from this process are unintentional.  Fritzi Mandes M.D    [facility]   CC: Primary care physician; Glean Hess, MDPatient ID: Stacie [name], female   DOB: 01-13-1956, 64 y.o.   MRN: [id number]

## 2020-07-22 NOTE — Progress Notes (Signed)
Physical Therapy Treatment Patient Details Name: Stacie [name] MRN: [id number] DOB: 06-23-1956 Today's Date: 07/22/2020    History of Present Illness Stacie [name] is a 64 y.o. Female with a past medical history significant for pulmonary sarcoidosis (on 2L [place] at baseline), pulmonary hypertension, OSA, asthma, hypertension, hypothyroidism, IBS, and chronic kidney disease who presents to [facility] ED on 07/09/20 due to altered mental status and shortness of breath.  Admitted for management of acute hypoxic respiratory failure and sepsis related to CAP, UTI.  Hospital course significant for periods of delirium requiring precedex infusion; questionable DIC with ischemic changes to L foot, R great toe; cystoscopy with pyelogram (9/28); extubation (9/29); and acute code stroke (9/30) due to new-onset slurred speech, R hemiparesis (NIHSS 16).    PT Comments    Pt received in bed on bedpan with daughter present. Pt noted to have spilled urine and was lying in wetness all down her BLE. Pt performed rolling to R to get off of bedpan with min A for completing rotation into full sidelying with verbal/tactile cues for LUE/LLE placement. Pt able to remain in sidelying position for pericare. Pt then rolled to L with min A and required max A for truncal elevation and BLE management off of bed. Once seated edge of bed, pt with noted LOB posteriorly then once assisted to get COG within BOS pt with anterior LOB requiring mod A for both occasions. BUE positioned in tripod position and pt able to maintain static sitting with SBA-min A. Pt performed 2 bouts of standing with max+2 A with use of RW. Verbal cues for anterior trunk lean, powering through BLE, and once upright, verbal and tactile cues for improved upright posture. Pt with bilateral knee buckling with quick descent to bed secondary to fatigue. Third standing bout performed without RW and required bilateral knee blocking for safety. Max+2 A for truncal management and  BLE elevation onto bed during sit to supine and for scooting to [facility]. Pt making good progress toward goals requiring decreased assistance for bed mobility. Pt progressed to standing today with max+2 A for 3 trials. Pt remained on 2L O2 throughout session and SpO2 remained above 92% with numbers increasing to 98% during activity. HR remained within 94-105 beats/minute. Pt continues to demonstrate cognitive deficits requiring multimodal cues for sequencing of activities/movements. Pt demonstrating improved tolerance to activity in session. Will continue to follow and progress as tolerated. Continue to recommend CIR at discharge from acute stay to optimize return to PLOF and maximize functional mobility.   Follow Up Recommendations  CIR     Equipment Recommendations       Recommendations for Other Services       Precautions / Restrictions Precautions Precautions: Fall Restrictions Weight Bearing Restrictions: No    Mobility  Bed Mobility Overal bed mobility: Needs Assistance Bed Mobility: Rolling;Supine to Sit;Sit to Supine Rolling: Min assist   Supine to sit: Max assist Sit to supine: Max assist;+2 for physical assistance   General bed mobility comments: min A for rolling to R side for removal of bedpan with verbal/tactile cues for hand placement on bedrail; supine to sit with max A for truncal elevation from flat bed and BLE management off of bed; max+2 A for truncal management and BLE elevation onto bed  Transfers Overall transfer level: Needs assistance Equipment used: Rolling walker (2 wheeled);None Transfers: Sit to/from Stand Sit to Stand: Max assist;+2 physical assistance         General transfer comment: first 2 trials utilizied  RW and required max+2 A for boosting to stand and eccentric control on descent; 3rd attempt pt fatigued and required bilateral knee blocking and max+2 A to come into standing  Ambulation/Gait             General Gait Details: not safe to  attempt at this time   Stairs             Wheelchair Mobility    Modified Rankin (Stroke Patients Only)       Balance Overall balance assessment: Needs assistance Sitting-balance support: Bilateral upper extremity supported;Feet supported Sitting balance-Leahy Scale: Poor Sitting balance - Comments: pt initially required min A for static sitting balance; max A for anterior and posterior LOB Postural control: Posterior lean;Other (comment) (anterior lean) Standing balance support: Bilateral upper extremity supported Standing balance-Leahy Scale: Poor Standing balance comment: required max+2 A for standing balance with BUE on RW                            Cognition Arousal/Alertness: Awake/alert Behavior During Therapy: WFL for tasks assessed/performed Overall Cognitive Status: Impaired/Different from baseline Area of Impairment: Memory;Following commands;Safety/judgement;Awareness;Problem solving                     Memory: Decreased short-term memory Following Commands: Follows one step commands inconsistently;Follows one step commands with increased time Safety/Judgement: Decreased awareness of deficits;Decreased awareness of safety   Problem Solving: Slow processing;Decreased initiation;Difficulty sequencing;Requires verbal cues;Requires tactile cues General Comments: Pt with poor self-initiation and required multimodal cues for sequencing of tasks. Pt requires increased time to process commands.      Exercises      General Comments        Pertinent Vitals/Pain Pain Assessment: No/denies pain    Home Living                      Prior Function            PT Goals (current goals can now be found in the care plan section) Acute Rehab PT Goals Patient Stated Goal: to get moving/EOB PT Goal Formulation: With patient Time For Goal Achievement: 08/01/20 Potential to Achieve Goals: Fair Progress towards PT goals: Progressing  toward goals    Frequency    Min 2X/week      PT Plan Current plan remains appropriate    Co-evaluation              AM-PAC PT "6 Clicks" Mobility   Outcome Measure  Help needed turning from your back to your side while in a flat bed without using bedrails?: A Little Help needed moving from lying on your back to sitting on the side of a flat bed without using bedrails?: A Lot Help needed moving to and from a bed to a chair (including a wheelchair)?: A Lot Help needed standing up from a chair using your arms (e.g., wheelchair or bedside chair)?: A Lot Help needed to walk in hospital room?: Total Help needed climbing 3-5 steps with a railing? : Total 6 Click Score: 11    End of Session Equipment Utilized During Treatment: Gait belt;Oxygen Activity Tolerance: Patient tolerated treatment well;Patient limited by fatigue Patient left: in bed;with call bell/phone within reach;with bed alarm set;with family/visitor present Nurse Communication: Mobility status PT Visit Diagnosis: Muscle weakness (generalized) (M62.81);Hemiplegia and hemiparesis Hemiplegia - Right/Left: Right Hemiplegia - dominant/non-dominant: Dominant Hemiplegia - caused by: Cerebral infarction  Time: 4562-5638 PT Time Calculation (min) (ACUTE ONLY): 26 min  Charges:                       Stacie [name], SPT   Stacie [name] 07/22/2020, 2:35 PM

## 2020-07-22 NOTE — Progress Notes (Signed)
[place], [place] 07/22/20  Subjective:   Hospital day # 12  Patient lying in bed in no acute distress.  No complaints of shortness of breath, nausea, or vomiting.  She reports poor intake as she didn't like the breakfast today.She is on D5 40 mL/hr.   Renal: 10/05 0701 - 10/06 0700 In: 836.2 [P.O.:360; I.V.:476.2] Out: 1450 [Urine:1450]   Objective:  Vital signs in last 24 hours:  Temp:  [97.5 F (36.4 C)-98 F (36.7 C)] 97.8 F (36.6 C) (10/06 0736) Pulse Rate:  [88-97] 90 (10/06 0736) Resp:  [16-35] 17 (10/06 0736) BP: (120-169)/(61-88) 154/74 (10/06 0736) SpO2:  [94 %-100 %] 96 % (10/06 0736)  Weight change:  Filed Weights   07/15/20 0432 07/16/20 0500 07/19/20 0500  Weight: 88.7 kg 87.5 kg 86.3 kg    Intake/Output:    Intake/Output Summary (Last 24 hours) at 07/22/2020 1216 Last data filed at 07/21/2020 2033 Gross per 24 hour  Intake 360 ml  Output 1150 ml  Net -790 ml     Physical Exam: General:  No acute distress,resting in bed,pleasant   HEENT  cannula in place with 2 L of oxygen  Pulm/lungs  lungs clear, normal and symmetrical respiratory effort  CVS/Heart  S1S2 + regular  Abdomen:   Soft, nondistended  Extremities:  No peripheral edema  Neurologic:  Awake, alert, speech clear, answers simple questions appropriately  Skin:  No acute lesions or rashes    Basic Metabolic Panel:  Recent Labs  Lab 07/18/20 0424 07/18/20 0424 07/19/20 0530 07/19/20 0530 07/20/20 0415 07/21/20 0617 07/22/20 0304  NA 146*  --  145  --  148* 141 143  K 4.3  --  3.8  --  4.0 4.2 4.5  CL 103  --  102  --  104 101 102  CO2 28  --  33*  --  32 29 28  GLUCOSE 194*  --  148*  --  124* 144* 152*  BUN 51*  --  41*  --  40* 41* 40*  CREATININE 1.32*  --  1.13*  --  1.19* 1.18* 1.18*  CALCIUM 7.9*   < > 7.5*   < > 8.0* 8.0* 8.4*  MG 1.9  --  1.7  --  2.2 1.8 1.7  PHOS 2.7  --  2.7  --  3.2 4.2 4.0   < > = values in this interval not  displayed.     CBC: Recent Labs  Lab 07/18/20 0424 07/18/20 0424 07/19/20 0530 07/20/20 0415 07/21/20 0617 07/21/20 1154 07/22/20 0304  WBC 22.9*   < > 26.1* 22.3* 20.6* 22.8* 21.6*  NEUTROABS 19.3*  --  22.2* 18.9* 17.1*  --  18.5*  HGB 13.8   < > 13.6 14.4 13.5 13.4 12.8  HCT 45.3   < > 43.9 45.0 43.0 43.4 40.9  MCV 101.1*   < > 100.2* 96.4 99.3 99.5 97.4  PLT 83*   < > 97* 139* 159 172 198   < > = values in this interval not displayed.      Lab Results  Component Value Date   HEPBSAG NON REACTIVE 07/15/2020   HEPBIGM NON REACTIVE 07/15/2020      Microbiology:  Recent Results (from the past 240 hour(s))  Culture, blood (Routine X 2) w Reflex to ID Panel     Status: None   Collection Time: 07/16/20  1:29 PM   Specimen: BLOOD  Result Value Ref Range Status  Specimen Description BLOOD LEFT ANTECUBITAL  Final   Special Requests   Final    AEROBIC BOTTLE ONLY Blood Culture results may not be optimal due to an inadequate volume of blood received in culture bottles   Culture   Final    NO GROWTH 5 DAYS Performed at [facility], [address] [place], [place] [address]    Report Status 07/21/2020 FINAL  Final  Culture, blood (Routine X 2) w Reflex to ID Panel     Status: None   Collection Time: 07/16/20  5:09 PM   Specimen: BLOOD  Result Value Ref Range Status   Specimen Description BLOOD BLOOD RIGHT HAND  Final   Special Requests   Final    BOTTLES DRAWN AEROBIC ONLY Blood Culture results may not be optimal due to an inadequate volume of blood received in culture bottles   Culture   Final    NO GROWTH 5 DAYS Performed at [facility], [address] [place],  [address]    Report Status 07/21/2020 FINAL  Final    Coagulation Studies: Recent Labs    07/21/20 1154  LABPROT 13.0  INR 1.0    Urinalysis: No results for input(s): COLORURINE, LABSPEC, PHURINE, GLUCOSEU, HGBUR, BILIRUBINUR, KETONESUR, PROTEINUR, UROBILINOGEN,  NITRITE, LEUKOCYTESUR in the last 72 hours.  Invalid input(s): APPERANCEUR    Imaging: MR BRAIN WO CONTRAST  Result Date: 07/21/2020 CLINICAL DATA:  64 year old female code stroke presentation on 07/16/2020. Several motion degraded exams, but diagnostic MRI yesterday suggesting a few punctate foci of white matter restricted diffusion such as due to small acute or subacute embolic infarcts. Repeat DWI, FLAIR and SWI imaging requested today. EXAM: MRI HEAD WITHOUT CONTRAST TECHNIQUE: Multiplanar, multiecho pulse sequences of the brain and surrounding structures were obtained without intravenous contrast. COMPARISON:  Brain MRI 07/20/2020 and earlier. FINDINGS: Study is intermittently degraded by motion artifact despite repeated imaging attempts. Brain: No intracranial mass effect or ventriculomegaly. Stable cerebral morphology since yesterday. Widespread, confluent cerebral white matter T2 and FLAIR hyperintensity again noted. Similar moderate T2 and FLAIR heterogeneity in the pons. Repeat DWI imaging is of slightly lesser quality than the yesterday. There remain only small vague areas of abnormal trace diffusion (such as in the right corona radiata series 5, image 29 and right parietal subcortical white matter on image 30). No new or progressive diffusion abnormality. SWI imaging is compared to T2 * imaging yesterday. No evidence of acute or chronic intracranial blood products. Vascular: Major intracranial vascular flow voids appear stable. Other: Stable mild mastoid effusion. Visible nasopharynx remains negative. IMPRESSION: 1. Unchanged DWI from yesterday with small, vague foci of trace diffusion abnormality suggesting scattered small foci of subacute white matter ischemia. Underlying advanced but nonspecific chronic signal changes in the cerebral white matter and the pons redemonstrated, most commonly due to small vessel disease. 2. Negative SWI imaging. 3. No new intracranial abnormality is evident.  Electronically Signed   By: Genevie Ann M.D.   On: 07/21/2020 15:26   MR BRAIN WO CONTRAST  Result Date: 07/20/2020 CLINICAL DATA:  Stroke follow-up. EXAM: MRI HEAD WITHOUT CONTRAST TECHNIQUE: Multiplanar, multiecho pulse sequences of the brain and surrounding structures were obtained without intravenous contrast. COMPARISON:  MRI of the brain July 16, 2020. FINDINGS: The study is partially degraded by motion. Brain: A few punctate foci of restricted diffusion/T2 hyperintensity are seen within the white matter of the bilateral frontal lobes, suggesting acute/subacute infarcts. No hemorrhage, hydrocephalus, extra-axial collection or mass lesion. Scattered and  confluent foci of T2 hyperintensity are seen within the white matter of the cerebral hemispheres and within the pons, nonspecific, most likely related to small vessel ischemia. Vascular: Normal flow voids. Skull and upper cervical spine: Normal marrow signal. Sinuses/Orbits: Bilateral lens surgery.  Paranasal sinuses are clear Other: Left mastoid effusion. IMPRESSION: 1. A few punctate foci of restricted diffusion/T2 hyperintensity within the white matter of the bilateral frontal lobes, suggesting acute/subacute infarcts, likely embolic. 2. Moderate to advanced white matter disease, likely chronic microangiopathy. 3. Left mastoid effusion. Electronically Signed   By: Pedro Earls M.D.   On: 07/20/2020 13:19     Medications:   . cefTRIAXone (ROCEPHIN)  IV Stopped (07/22/20 0336)  . dextrose 40 mL/hr at 07/22/20 0312   . arformoterol  15 mcg Nebulization BID  . aspirin EC  81 mg Oral Daily  . atorvastatin  40 mg Oral Daily  . budesonide (PULMICORT) nebulizer solution  0.25 mg Nebulization BID  . Chlorhexidine Gluconate Cloth  6 each Topical Q0600  . docusate  100 mg Oral BID  . dorzolamide  1 drop Both Eyes BID  . escitalopram  20 mg Oral Daily  . feeding supplement (ENSURE ENLIVE)  237 mL Oral TID BM  . heparin injection  (subcutaneous)  5,000 Units Subcutaneous Q8H  . insulin aspart  0-15 Units Subcutaneous TID AC & HS  . latanoprost  1 drop Right Eye QHS  . levothyroxine  137 mcg Oral Q0600  . methylPREDNISolone (SOLU-MEDROL) injection  20 mg Intravenous Daily  . multivitamin with minerals  1 tablet Oral Daily  . nystatin  5 mL Oral QID  . nystatin  1 application Topical TID  . pantoprazole (PROTONIX) IV  40 mg Intravenous QHS  . polyethylene glycol  17 g Oral Daily  . thiamine injection  100 mg Intravenous Daily  . Treprostinil  18 mcg Inhalation QID   acetaminophen, bisacodyl, docusate sodium, polyethylene glycol  Assessment/ Plan:  64 y.o. female with  sarcoidosis, interstitial lung disease, obstructive sleep apnea, depression, GERD, hyperlipidemia, hypothyroidism   admitted on 07/09/2020 for Elevated LFTs [R79.89] Sepsis ([place]) [A41.9] Sepsis, due to unspecified organism, unspecified whether acute organ dysfunction present [facility]) [A41.9]  # AKI Baseline Cr 1.0 in May 2021 Admi Cr 4.16, peaked at 4.57 -Patient continues to make excellent renal progress.  Creatinine down to 1.1 with urine output of 1.6.  Avoid nephrotoxins and contrast as possible. Lab Results  Component Value Date   CREATININE 1.18 (H) 07/22/2020   CREATININE 1.18 (H) 07/21/2020   CREATININE 1.19 (H) 07/20/2020     #Sepsis from urinary source Klebsiella pneumonia and blood in urine Patient has significant history of renal stones.  History of left ureteroscopy May 2021 She is on Ceftriaxone started on 07/10/20  #History of pulmonary sarcoidosis, pulmonary hypertension Acute on chronic respiratory failure, community-acquired pneumonia  requires home oxygen and CPAP Was intubated and mechanically ventilated - now extubated -Patient is currently on 2 L of oxygen by nasal cannula, no acute respiratory distress noted  # Hypernatremia Serum sodium 143 today.   Will continue D5W  Oral intake still stays poor.    LOS:  12 [place] 10/6/202112:16 PM  [place], [place]  Note: This note was prepared with Dragon dictation. Any transcription errors are unintentional

## 2020-07-22 NOTE — Progress Notes (Signed)
Date of Admission:  07/09/2020    ID: Stacie [name] is a 64 y.o. female  Active Problems:   Sepsis ([place])    Subjective:  Out of ICU Says she is feeling better No fever Medications:  . arformoterol  15 mcg Nebulization BID  . aspirin EC  81 mg Oral Daily  . atorvastatin  40 mg Oral Daily  . budesonide (PULMICORT) nebulizer solution  0.25 mg Nebulization BID  . Chlorhexidine Gluconate Cloth  6 each Topical Q0600  . docusate  100 mg Oral BID  . dorzolamide  1 drop Both Eyes BID  . escitalopram  20 mg Oral Daily  . feeding supplement (ENSURE ENLIVE)  237 mL Oral TID BM  . heparin injection (subcutaneous)  5,000 Units Subcutaneous Q8H  . insulin aspart  0-15 Units Subcutaneous TID AC & HS  . latanoprost  1 drop Right Eye QHS  . levothyroxine  137 mcg Oral Q0600  . methylPREDNISolone (SOLU-MEDROL) injection  20 mg Intravenous Daily  . multivitamin with minerals  1 tablet Oral Daily  . nystatin  5 mL Oral QID  . pantoprazole (PROTONIX) IV  40 mg Intravenous QHS  . polyethylene glycol  17 g Oral Daily  . thiamine injection  100 mg Intravenous Daily  . Treprostinil  18 mcg Inhalation QID    Objective: Vital signs in last 24 hours: Temp:  [97.5 F (36.4 C)-98 F (36.7 C)] 97.8 F (36.6 C) (10/06 0736) Pulse Rate:  [88-97] 90 (10/06 0736) Resp:  [16-35] 17 (10/06 0736) BP: (120-169)/(61-88) 154/74 (10/06 0736) SpO2:  [94 %-100 %] 96 % (10/06 0736)  PHYSICAL EXAM:  General: Alert, cooperative, no distress, appears stated age.  Head: Normocephalic, without obvious abnormality, atraumatic. Eyes: Conjunctivae clear, anicteric sclerae. Pupils are equal ENT Nares normal. No drainage or sinus tenderness. Lips, mucosa, and tongue normal. Oropharyngeal Thrush Neck: Supple, . Lungs:b/l air entry- crepts rt side Hss 1s2 Abdomen: Soft, non-tender,not distended. Bowel sounds normal. No masses Extremities: necrosis left foot and rt great toe Skin: No rashes or lesions. Or  bruising Lymph: Cervical, supraclavicular normal. Neurologic: moving all extremities- but rt lower < left tLab Results Recent Labs    07/21/20 0617 07/21/20 0617 07/21/20 1154 07/22/20 0304  WBC 20.6*   < > 22.8* 21.6*  HGB 13.5   < > 13.4 12.8  HCT 43.0   < > 43.4 40.9  NA 141  --   --  143  K 4.2  --   --  4.5  CL 101  --   --  102  CO2 29  --   --  28  BUN 41*  --   --  40*  CREATININE 1.18*  --   --  1.18*   < > = values in this interval not displayed.   Liver Panel Recent Labs    07/21/20 0617  PROT 5.9*  ALBUMIN 2.8*  AST 98*  ALT 92*  ALKPHOS 71  BILITOT 0.8  BILIDIR 0.2  IBILI 0.6   Sedimentation Rate No results for input(s): ESRSEDRATE in the last 72 hours. C-Reactive Protein No results for input(s): CRP in the last 72 hours.  Microbiology:  Studies/Results: MR BRAIN WO CONTRAST  Result Date: 07/21/2020 CLINICAL DATA:  64 year old female code stroke presentation on 07/16/2020. Several motion degraded exams, but diagnostic MRI yesterday suggesting a few punctate foci of white matter restricted diffusion such as due to small acute or subacute embolic infarcts. Repeat DWI, FLAIR and SWI imaging  requested today. EXAM: MRI HEAD WITHOUT CONTRAST TECHNIQUE: Multiplanar, multiecho pulse sequences of the brain and surrounding structures were obtained without intravenous contrast. COMPARISON:  Brain MRI 07/20/2020 and earlier. FINDINGS: Study is intermittently degraded by motion artifact despite repeated imaging attempts. Brain: No intracranial mass effect or ventriculomegaly. Stable cerebral morphology since yesterday. Widespread, confluent cerebral white matter T2 and FLAIR hyperintensity again noted. Similar moderate T2 and FLAIR heterogeneity in the pons. Repeat DWI imaging is of slightly lesser quality than the yesterday. There remain only small vague areas of abnormal trace diffusion (such as in the right corona radiata series 5, image 29 and right parietal  subcortical white matter on image 30). No new or progressive diffusion abnormality. SWI imaging is compared to T2 * imaging yesterday. No evidence of acute or chronic intracranial blood products. Vascular: Major intracranial vascular flow voids appear stable. Other: Stable mild mastoid effusion. Visible nasopharynx remains negative. IMPRESSION: 1. Unchanged DWI from yesterday with small, vague foci of trace diffusion abnormality suggesting scattered small foci of subacute white matter ischemia. Underlying advanced but nonspecific chronic signal changes in the cerebral white matter and the pons redemonstrated, most commonly due to small vessel disease. 2. Negative SWI imaging. 3. No new intracranial abnormality is evident. Electronically Signed   By: Genevie Ann M.D.   On: 07/21/2020 15:26   MR BRAIN WO CONTRAST  Result Date: 07/20/2020 CLINICAL DATA:  Stroke follow-up. EXAM: MRI HEAD WITHOUT CONTRAST TECHNIQUE: Multiplanar, multiecho pulse sequences of the brain and surrounding structures were obtained without intravenous contrast. COMPARISON:  MRI of the brain July 16, 2020. FINDINGS: The study is partially degraded by motion. Brain: A few punctate foci of restricted diffusion/T2 hyperintensity are seen within the white matter of the bilateral frontal lobes, suggesting acute/subacute infarcts. No hemorrhage, hydrocephalus, extra-axial collection or mass lesion. Scattered and confluent foci of T2 hyperintensity are seen within the white matter of the cerebral hemispheres and within the pons, nonspecific, most likely related to small vessel ischemia. Vascular: Normal flow voids. Skull and upper cervical spine: Normal marrow signal. Sinuses/Orbits: Bilateral lens surgery.  Paranasal sinuses are clear Other: Left mastoid effusion. IMPRESSION: 1. A few punctate foci of restricted diffusion/T2 hyperintensity within the white matter of the bilateral frontal lobes, suggesting acute/subacute infarcts, likely embolic.  2. Moderate to advanced white matter disease, likely chronic microangiopathy. 3. Left mastoid effusion. Electronically Signed   By: Pedro Earls M.D.   On: 07/20/2020 13:19     Assessment/Plan: Oropharyngeal candidiasis - on Nystatin   New encephalopathy and slurred speech-resolved stroke questioned - repeat MRI 07/20/20 showed b/l frontal lobe subacute infarctt questioning emboli Ideally will need TEE/carotid doppler  Klebsiella bacteremia with sepsis due to UTI with rt pyelonpehritis due to rt obstructing ureteric stone causing hydo S/p stenton 07/15/20 No feverin 5 days Repeat blood culturefrom 9/30 -No growth Plan is to Continue ceftriaxoneuntil 07/26/20 but as emboli to brain is questioned will need TEE to look for vegetation VS mural thrombus. That will decide duration of antibiotic. Spoke to Dr.Bhagat Neurologist  Ischemia of the feet-left > rt- could be the reason for persistent leucocytosis   Hypernatremia-better  Severe thrombocytopenia--resolved  Abnormal lfts- -improving  AKi- on CKD- improving  Pulmonary sarcoidosis on methylprednsione ( was on methotrexate at home) home oxygen  H/o left ureteric stone s/p lithotripsy and stent in May 2021 - stent removed after that H/o Over active bladder, recurrent UTI  Discussed the management with patient and her daughter and neurologist

## 2020-07-22 NOTE — Plan of Care (Signed)
  Problem: Clinical Measurements: Goal: Ability to maintain clinical measurements within normal limits will improve Outcome: Progressing Goal: Will remain free from infection Outcome: Progressing Goal: Diagnostic test results will improve Outcome: Progressing Goal: Respiratory complications will improve Outcome: Progressing Goal: Cardiovascular complication will be avoided Outcome: Progressing   Problem: Activity: Goal: Risk for activity intolerance will decrease Outcome: Progressing   Problem: Coping: Goal: Level of anxiety will decrease Outcome: Progressing   Problem: Elimination: Goal: Will not experience complications related to bowel motility Outcome: Progressing Goal: Will not experience complications related to urinary retention Outcome: Progressing   Problem: Safety: Goal: Ability to remain free from injury will improve Outcome: Progressing   Problem: Pain Managment: Goal: General experience of comfort will improve Outcome: Progressing

## 2020-07-22 NOTE — Progress Notes (Addendum)
Subjective: Reports feeling worn out, no acute complaints  Exam: Vitals:   07/22/20 0736 07/22/20 1250  BP: (!) 154/74 130/62  Pulse: 90 90  Resp: 17 17  Temp: 97.8 F (36.6 C) 98 F (36.7 C)  SpO2: 96% 100%   Gen: In bed, NAD Resp: non-labored breathing, no acute distress Abd: soft, nt Extremities: Mottled feet with significant purple discoloration L > R  Neuro: MS: Awake, alert, oriented to month, year, ("[place]") but initially states her daughter is in 68 then 85 (she is 59), no motor apraxia on complex finger position testing CN: Visual fields full, EOMI, tongue midline, sensation intact, face symmetric, uvula elevates symmetrically Motor: She has a right > left, distal > proximal paresis. She is able to follow commands in all four extremities, and is generally moving more briskly today compared to yesterday  Sensory: She endorses symmetric sensation  Pertinent Labs: LDH 368 LDL 198, Cholesterol 289, VLDL 58, Triglycerides 290 HDL 33  A1c 6.4% on admission  Platelet count has recovered   Summary of pertinent imaging: Initial MRI 9/30 very motion degraded with no clear acute process, possible watershed infarcts Repeat MRI at 10/4 with punctate foci of possible embolic infarcts, overly done white matter disease (chronic microangiopathy with possible overlying subacute watershed infarcts) Repeat MRI brain 10/5, stable, no evidence of hemorrhagic conversion    Impression: 64 year old female with a right greater than left paresis that occurred in the setting of severe sepsis.  She is continuing to gradually improve.  In extended discussion with infectious disease today she has two potential embolic phenomenon (possible cardioembolic infarcts in the brain versus DIC related infarcts, and possible embolic insults to her feet versus vasoconstriction injury due to requirement for pressors).  I discussed with her husband Dr. Kathyrn Sheriff that a TEE is prudent in this setting to guide  course of antibiotics.  He is in agreement with this plan.   Recommendations: -Please consult cardiology for TEE and request contrasted study to confirm there is no intracardiac thrombus either (no contrast was used for prior TTE)  -Appreciate continued excellent care of her medical comorbidities -Continue aspirin 81 mg indefinitely -Continue atorvastatin 40 mg nightly Continue to follow delirium precautions:   - try to minimize deliriogenic medications as much as possible (J Am Geriatr Soc. 2012 Apr;60(4):616-31): benzodiazepines, anticholinergics, diphenhydramine, antihistamines, narcotics, Ambien/Lunesta/Sonata etc. - environmental support for delirium: Lights on during the day, patient up and out of bed as much as is feasible, OT/PT, quiet dimly lit room at night, reorient patient often, provide hearing aides and glasses if patient uses them routinely, minimize sleep disruptions as much as possible overnight.  As much as possible, reorient patient, and have them engage patient in activities, e.g. playing cards. TV should be off or on neutral background music unless patient engaged and watching. Try to keep interactions with the patient calm and quiet.  -Neurology will follow up results of TEE but will not be formally rounding on the patient unless there is an acute change in which case we should be recontacted  Lesleigh Noe MD-PhD [facility] [phone]  If 8pm- 8am, please page neurology on call as listed in [place].

## 2020-07-22 NOTE — Progress Notes (Signed)
[facility] Daily Progress Note   Subjective: Patient without complaint. Denies any lower extremity pain.   Objective: Vitals:   07/22/20 0032 07/22/20 0416 07/22/20 0736 07/22/20 1250  BP: (!) 148/88 (!) 169/77 (!) 154/74 130/62  Pulse: 88 92 90 90  Resp: _0 Temp: 97.6 F (36.4 C) (!) 97.5 F (36.4 C) 97.8 F (36.6 C) 98 F (36.7 C)  TempSrc:  Oral Oral Oral  SpO2: 100% 94% 96% 100%  Weight:      Height:        Intake/Output Summary (Last 24 hours) at 07/22/2020 1513 Last data filed at 07/21/2020 2033 Gross per 24 hour  Intake 240 ml  Output 800 ml  Net -560 ml   Physical Exam: A&Ox3, NAD CV: RRR Pulmonary: CTA Bilaterally Abdomen: Soft, Nontender, Nondistended Vascular:  Right Lower Extremity: Thigh soft, calf soft. Warm. Discoloration to toes.   Left Lower Extremity:  Thigh soft, calf soft. Warm. Discoloration to toes. 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    Laboratory: CBC    Component Value Date/Time   WBC 21.6 (H) 07/22/2020 0304   HGB 12.8 07/22/2020 0304   HGB 11.7 07/13/2020 1301   HCT 40.9 07/22/2020 0304   HCT 39.2 05/27/2019 0947   PLT 198 07/22/2020 0304   PLT 299 05/27/2019 0947   BMET    Component Value Date/Time   NA 143 07/22/2020 0304   NA 144 05/27/2019 0947   NA 142 12/28/2012 0454   K 4.5 07/22/2020 0304   K 3.4 (L) 12/28/2012 0454   CL 102 07/22/2020 0304   CL 111 (H) 12/28/2012 0454   CO2 28 07/22/2020 0304   CO2 27 12/28/2012 0454   GLUCOSE 152 (H) 07/22/2020 0304   GLUCOSE 95 12/28/2012 0454   BUN 40 (H) 07/22/2020 0304   BUN 21 05/27/2019 0947   BUN 8 12/28/2012 0454   CREATININE 1.18 (H) 07/22/2020 0304   CREATININE 1.14 12/28/2012 0454   CALCIUM 8.4 (L) 07/22/2020 0304   CALCIUM 8.0 (L) 12/28/2012 0454   GFRNONAA 48 (L) 07/22/2020 0304   GFRNONAA 53 (L) 12/28/2012 0454   GFRAA 56 (L) 07/21/2020 0617   GFRAA >60 12/28/2012 0454   Assessment/Planning: The patient is a 64 year old  female with multiple medical issues, recent critical illness, with 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 Denies any BLE pain. 4) Plan to hold off intervention at this time as the patient is asymptomatic and gangrene is stable.   Seen and examined with Dr. Ellis Parents [facility] PA-C 07/22/2020 3:13 PM

## 2020-07-22 NOTE — Evaluation (Signed)
Speech Language Pathology Evaluation Patient Details Name: Stacie [name] MRN: [id number] DOB: [birth year] Today's Date: 07/22/2020 Time: 0830-0910 SLP Time Calculation (min) (ACUTE ONLY): 40 min  Problem List:  Patient Active Problem List   Diagnosis Date Noted  . Klebsiella pneumoniae sepsis (HCC)   . Critical lower limb ischemia (HCC)   . Acute renal failure (HCC)   . Cerebrovascular accident (CVA) due to embolism of cerebral artery (HCC)   . Sepsis (HCC) 07/10/2020  . Pulmonary hypertension (HCC) 12/18/2018  . Depression, major, recurrent, in partial remission (HCC) 10/22/2018  . Compression fx, thoracic spine (HCC) 12/04/2017  . Morbid obesity due to excess calories (HCC) 05/08/2017  . Hyperlipidemia, mixed 04/21/2017  . Cough variant asthma  vs UACS  02/20/2017  . Hypothyroidism, acquired 03/27/2015  . Essential hypertension 03/27/2015  . Acquired absence of both cervix and uterus 03/27/2015  . H/O renal calculi 03/27/2015  . Allergic rhinitis 01/07/2015  . Obstructive sleep apnea 09/29/2014  . Other malaise and fatigue 06/03/2014  . Asthma, chronic 02/24/2014  . Cough 04/30/2013  . Pulmonary sarcoidosis (HCC) 03/12/2013  . CKD (chronic kidney disease) stage 3, GFR 30-59 ml/min (HCC) 03/12/2013  . Calcium blood increased 05/23/2012   Past Medical History:  Past Medical History:  Diagnosis Date  . Arrhythmia    patient unaware if this is current  . Asthma   . GERD (gastroesophageal reflux disease)   . Heart murmur   . History of kidney stones   . HOH (hard of hearing)    wear aids  . Hypothyroidism   . IBS (irritable bowel syndrome)   . Pulmonary hypertension (HCC)   . Sarcoid   . Sarcoidosis   . Seasonal allergies   . Sleep apnea CPAP with O2  . Wears hearing aid in both ears    Past Surgical History:  Past Surgical History:  Procedure Laterality Date  . CARDIAC CATHETERIZATION  10/18/2018   [facility]  . CATARACT EXTRACTION W/PHACO Left 07/31/2019    Procedure: CATARACT EXTRACTION PHACO AND INTRAOCULAR LENS PLACEMENT (IOC) LEFT 00:51.1  17.9%  9.15;  Surgeon: Lockie Mola, MD;  Location: [facility];  Service: Ophthalmology;  Laterality: Left;  keep this patient second  . COLON SURGERY     "colon was fused to bladder - operated on both"  . COLONOSCOPY  09/18/2007   diverticuli, no polyps  . COLONOSCOPY  05/26/2010   diverticuli, no polyps  . CYSTOSCOPY WITH STENT PLACEMENT Bilateral 07/14/2020   Procedure: CYSTOSCOPY WITH STENT PLACEMENT, RETROPYLOGRAM;  Surgeon: Sondra Come, MD;  Location: [facility];  Service: Urology;  Laterality: Bilateral;  . CYSTOSCOPY/URETEROSCOPY/HOLMIUM LASER/STENT PLACEMENT Left 02/20/2020   Procedure: CYSTOSCOPY/URETEROSCOPY/LITHOTRIPSY /STENT PLACEMENT;  Surgeon: Vanna Scotland, MD;  Location: [facility];  Service: Urology;  Laterality: Left;  . PARS PLANA VITRECTOMY Right 05/20/2015   Procedure: PARS PLANA VITRECTOMY WITH 25 GAUGE, laser;  Surgeon: Marcelene Butte, MD;  Location: [facility];  Service: Ophthalmology;  Laterality: Right;  . PARTIAL HYSTERECTOMY  1990  . TUBAL LIGATION     HPI:  Stacie [name] is a 64 y.o. female with a past medical history significant for pulmonary sarcoidosis (on 2L [place] at baseline), pulmonary hypertension, OSA, asthma, hypertension, hypothyroidism, IBS, and chronic kidney disease who presents to [facility] ED on 07/09/20 due to altered mental status and shortness of breath. Patient found to have Klebsiella pneumoniae bacteremia due to UTI, associated encephalopathy requiring BiPAP.  Patient noted to have ischemic toes on July 13, 2020.  Vascular surgery  was consulted by NP [place] in regard to possible "bilateral lower extremity ischemia".  MRI on 07/20/2020 revealed a few punctate foci of restricted diffusion/T2 hyperintensity within the white matter of the bilateral frontal lobes, suggesting acute/subacute infarcts, likely embolic, with  moderate to advanced white  matter disease, likely chronic microangiopathy.   Assessment / Plan / Recommendation Clinical Impression  Pt presents with moderate cognitive impairments c/b deficits in sustained attention, processing speed, memory (specifically deficits in working memory, short term memory, retrieval and recall of new information), insight into deficits, and basic problem solving. This results with inability to recall her children's names, decreased ability to semi-complex yes/no questions, inability to answer questions after listening to a 3 sentence paragraph, off topic comments, and she requires more than a reasonable amount of time to respond to verbal information and there were multiple occasions that pt thought she had answered a question when SLP was continuing to wait for pt's answer. Pt's speech-language abilities are judged to be functional but are impacted by delayed processing, attention and memory deficits. Skilled ST services are required to target the above mentioned deficits, increase pt's functional independence and reduced caregiver burden.     SLP Assessment  SLP Recommendation/Assessment: Patient needs continued Speech Lanaguage Pathology Services SLP Visit Diagnosis: Cognitive communication deficit (R41.841)    Follow Up Recommendations  Inpatient Rehab    Frequency and Duration min 2x/week  2 weeks      SLP Evaluation Cognition  Overall Cognitive Status: Impaired/Different from baseline Arousal/Alertness: Awake/alert Attention: Sustained Sustained Attention: Impaired Sustained Attention Impairment: Verbal basic;Functional basic Memory: Impaired Memory Impairment: Decreased recall of new information;Retrieval deficit;Decreased short term memory Decreased Short Term Memory: Verbal basic;Functional basic Awareness: Impaired Awareness Impairment: Intellectual impairment;Emergent impairment;Anticipatory impairment Problem Solving: Impaired Problem Solving Impairment: Verbal  basic;Functional basic Executive Function:  (all impaired by lower level deficits) Behaviors: Impulsive       Comprehension  Auditory Comprehension Overall Auditory Comprehension: Appears within functional limits for tasks assessed (Cognitive deficits impact yes/no and commands) Visual Recognition/Discrimination Discrimination: Not tested Reading Comprehension Reading Status: Not tested    Expression Expression Primary Mode of Expression: Verbal Verbal Expression Overall Verbal Expression: Appears within functional limits for tasks assessed (cognitive deficits impact verbal expression) Written Expression Dominant Hand: Right   Oral / Motor  Oral Motor/Sensory Function Overall Oral Motor/Sensory Function: Within functional limits Motor Speech Overall Motor Speech: Appears within functional limits for tasks assessed   GO                   Rokhaya Quinn B. Dreama Saa M.S., CCC-SLP, [facility] Speech-Language Pathologist Rehabilitation Services Office [phone]  Reuel Derby 07/22/2020, 3:16 PM

## 2020-07-22 NOTE — Progress Notes (Signed)
PODIATRY / FOOT AND ANKLE SURGERY PROGRESS NOTE  Requesting Physician: Olga Millers NP  Reason for consult: Necrosis both forefeet  Chief Complaint: Dry gangrene of both feet   HPI: Stacie [name] is a 64 y.o. female who presents resting in bed comfortably.  Family at bedside today.  They have not seen any other changes in patient's feet since her last examination.  Pt more communicative today.  PMHx:  Past Medical History:  Diagnosis Date  . Arrhythmia    patient unaware if this is current  . Asthma   . GERD (gastroesophageal reflux disease)   . Heart murmur   . History of kidney stones   . HOH (hard of hearing)    wear aids  . Hypothyroidism   . IBS (irritable bowel syndrome)   . Pulmonary hypertension ([place])   . Sarcoid   . Sarcoidosis   . Seasonal allergies   . Sleep apnea CPAP with O2  . Wears hearing aid in both ears     Surgical Hx:  Past Surgical History:  Procedure Laterality Date  . CARDIAC CATHETERIZATION  10/18/2018   [facility]  . CATARACT EXTRACTION W/PHACO Left 07/31/2019   Procedure: CATARACT EXTRACTION PHACO AND INTRAOCULAR LENS PLACEMENT (IOC) LEFT 00:51.1  17.9%  9.15;  Surgeon: Leandrew Koyanagi, MD;  Location: [place];  Service: Ophthalmology;  Laterality: Left;  keep this patient second  . COLON SURGERY     "colon was fused to bladder - operated on both"  . COLONOSCOPY  09/18/2007   diverticuli, no polyps  . COLONOSCOPY  05/26/2010   diverticuli, no polyps  . CYSTOSCOPY WITH STENT PLACEMENT Bilateral 07/14/2020   Procedure: CYSTOSCOPY WITH STENT PLACEMENT, RETROPYLOGRAM;  Surgeon: Billey Co, MD;  Location: [facility];  Service: Urology;  Laterality: Bilateral;  . CYSTOSCOPY/URETEROSCOPY/HOLMIUM LASER/STENT PLACEMENT Left 02/20/2020   Procedure: CYSTOSCOPY/URETEROSCOPY/LITHOTRIPSY /STENT PLACEMENT;  Surgeon: Hollice Espy, MD;  Location: [facility];  Service: Urology;  Laterality: Left;  . PARS PLANA VITRECTOMY Right 05/20/2015    Procedure: PARS PLANA VITRECTOMY WITH 25 GAUGE, laser;  Surgeon: Milus Height, MD;  Location: [facility];  Service: Ophthalmology;  Laterality: Right;  . PARTIAL HYSTERECTOMY  1990  . TUBAL LIGATION      FHx:  Family History  Problem Relation Age of Onset  . Allergies Father   . Asthma Father   . Colon cancer Father   . Allergies Brother   . Asthma Brother   . Breast cancer Maternal Grandmother     Social History:  reports that she has never smoked. She has never used smokeless tobacco. She reports that she does not drink alcohol and does not use drugs.  Allergies:  Allergies  Allergen Reactions  . Nitrofurantoin Nausea Only  . Tramadol     Nausea and vomiting  . Sulfa Antibiotics Rash    As an infant    Review of Systems: Unable to ascertain due to patient's status  Medications Prior to Admission  Medication Sig Dispense Refill  . budesonide-formoterol (SYMBICORT) 80-4.5 MCG/ACT inhaler Take 2 puffs first thing in am and then another 2 puffs about 12 hours later. (Patient taking differently: Inhale 2 puffs into the lungs in the morning and at bedtime. Take 2 puffs first thing in am and then another 2 puffs about 12 hours later.) 1 Inhaler 11  . denosumab (PROLIA) 60 MG/ML SOSY injection Inject 60 mg into the skin every 6 (six) months.    . dorzolamide (TRUSOPT) 2 % ophthalmic solution Place  1 drop into both eyes 2 (two) times daily.    Marland Kitchen escitalopram (LEXAPRO) 20 MG tablet     . folic acid (FOLVITE) 1 MG tablet Take 1 mg by mouth daily.  11  . latanoprost (XALATAN) 0.005 % ophthalmic solution Place 1 drop into the right eye at bedtime.    . methotrexate (TREXALL) 5 MG tablet TAKE 5 TABLETS (25 MG TOTAL) BY MOUTH EVERY 7 (SEVEN) DAYS    . Multiple Vitamins-Minerals (MULTIVITAMIN WITH MINERALS) tablet Take 1 tablet by mouth daily.    . NON FORMULARY CPAP nightly    . predniSONE (DELTASONE) 10 MG tablet Take 10 mg by mouth daily with breakfast. 12.5 mg    . SYNTHROID 137  MCG tablet TAKE 1 TABLET (137 MCG TOTAL) BY MOUTH DAILY BEFORE BREAKFAST. 90 tablet 0  . Treprostinil (TYVASO) 0.6 MG/ML SOLN Inhale 18 mcg into the lungs 4 (four) times daily. 12 breaths 4 times a day    . albuterol (VENTOLIN HFA) 108 (90 Base) MCG/ACT inhaler Inhale 2 puffs into the lungs every 6 (six) hours as needed for wheezing. (Patient not taking: Reported on 07/10/2020) 1 Inhaler 2  . brimonidine (ALPHAGAN) 0.2 % ophthalmic solution Place 1 drop into both eyes 3 (three) times daily.  (Patient not taking: Reported on 05/12/2020)    . calcium carbonate (TUMS - DOSED IN MG ELEMENTAL CALCIUM) 500 MG chewable tablet Chew 1 tablet by mouth daily as needed for indigestion or heartburn. (Patient not taking: Reported on 07/10/2020)    . cephALEXin (KEFLEX) 500 MG capsule Take 1 capsule (500 mg total) by mouth 3 (three) times daily. (Patient not taking: Reported on 06/12/2020) 21 capsule 0  . conjugated estrogens (PREMARIN) vaginal cream Apply one pea-sized amount around the opening of the urethra three times weekly. (Patient not taking: Reported on 07/10/2020) 30 g 3  . methotrexate (RHEUMATREX) 2.5 MG tablet Take by mouth. (Patient not taking: Reported on 07/10/2020)    . mirabegron ER (MYRBETRIQ) 25 MG TB24 tablet Take 1 tablet (25 mg total) by mouth daily. (Patient not taking: Reported on 07/10/2020) 30 tablet 11  . oxybutynin (DITROPAN) 5 MG tablet  (Patient not taking: Reported on 07/10/2020)    . oxyCODONE-acetaminophen (PERCOCET/ROXICET) 5-325 MG tablet  (Patient not taking: Reported on 05/12/2020)    . OXYGEN Inhale 2 L into the lungs as needed.    Marland Kitchen Respiratory Therapy Supplies (FLUTTER) DEVI Use as directed (Patient not taking: Reported on 07/10/2020) 1 each 0  . VITAMIN D PO Take 1 tablet by mouth once a week. (Patient not taking: Reported on 07/10/2020)    . Vitamin D, Ergocalciferol, (DRISDOL) 1.25 MG (50000 UNIT) CAPS capsule Take 50,000 Units by mouth once a week. (Patient not taking: Reported on  06/12/2020)      Physical Exam: General: Alert and oriented.  No apparent distress.  Vascular: DP/PT pulses nonpalpable bilateral.  Feet appear to be cold to touch.  Capillary fill time appears to be absent to digits 1 through 4 left foot and to the dorsal foot to the midfoot area.  Capillary fill time appears to be absent to the right hallux distally.  No hair growth to digits.  Neuro: Light touch sensation reduced to both feet.  Derm: Dusky type changes to the left forefoot and right forefoot.  Appears to be worse on the left side than right.  Capillary fill time appears to be absent to the left first, second, third, fourth digits to the level of the  metatarsal phalangeal joints.  Patient also has dusky changes to the dorsal aspect of the left foot with capillary fill time appearing to be absent to the level of the midfoot.  Capillary fill time appears to be intact to all digits to the right foot except for the distal right hallux.  Violaceous hue noted to all digits.  No acute signs of infection present.  Pictures as seen below.  Left foot   Right foot   MSK: Patient able to move toes and both feet but decreased strength noted overall.  Results for orders placed or performed during the hospital encounter of 07/09/20 (from the past 48 hour(s))  Glucose, capillary     Status: Abnormal   Collection Time: 07/21/20  3:31 AM  Result Value Ref Range   Glucose-Capillary 135 (H) 70 - 99 mg/dL    Comment: Glucose reference range applies only to samples taken after fasting for at least 8 hours.  CBC with Differential/Platelet     Status: Abnormal   Collection Time: 07/21/20  6:17 AM  Result Value Ref Range   WBC 20.6 (H) 4.0 - 10.5 K/uL   RBC 4.33 3.87 - 5.11 MIL/uL   Hemoglobin 13.5 12.0 - 15.0 g/dL   HCT 43.0 36 - 46 %   MCV 99.3 80.0 - 100.0 fL   MCH 31.2 26.0 - 34.0 pg   MCHC 31.4 30.0 - 36.0 g/dL   RDW 14.2 11.5 - 15.5 %   Platelets 159 150 - 400 K/uL   nRBC 0.0 0.0 - 0.2 %    Neutrophils Relative % 83 %   Neutro Abs 17.1 (H) 1.7 - 7.7 K/uL   Lymphocytes Relative 4 %   Lymphs Abs 0.8 0.7 - 4.0 K/uL   Monocytes Relative 10 %   Monocytes Absolute 2.1 (H) 0 - 1 K/uL   Eosinophils Relative 1 %   Eosinophils Absolute 0.1 0 - 0 K/uL   Basophils Relative 0 %   Basophils Absolute 0.1 0 - 0 K/uL   Immature Granulocytes 2 %   Abs Immature Granulocytes 0.30 (H) 0.00 - 0.07 K/uL    Comment: Performed at [facility], [address], [place], [place] [address]  Phosphorus     Status: None   Collection Time: 07/21/20  6:17 AM  Result Value Ref Range   Phosphorus 4.2 2.5 - 4.6 mg/dL    Comment: Performed at [facility], [address] [place], [place] [address]  Magnesium     Status: None   Collection Time: 07/21/20  6:17 AM  Result Value Ref Range   Magnesium 1.8 1.7 - 2.4 mg/dL    Comment: Performed at [facility], [address], [place], [place] [address]  Basic metabolic panel     Status: Abnormal   Collection Time: 07/21/20  6:17 AM  Result Value Ref Range   Sodium 141 135 - 145 mmol/L   Potassium 4.2 3.5 - 5.1 mmol/L   Chloride 101 98 - 111 mmol/L   CO2 29 22 - 32 mmol/L   Glucose, Bld 144 (H) 70 - 99 mg/dL    Comment: Glucose reference range applies only to samples taken after fasting for at least 8 hours.   BUN 41 (H) 8 - 23 mg/dL   Creatinine, Ser 1.18 (H) 0.44 - 1.00 mg/dL   Calcium 8.0 (L) 8.9 - 10.3 mg/dL   GFR calc non Af Amer 48 (L) >60 mL/min   GFR calc Af Amer 56 (L) >60  mL/min   Anion gap 11 5 - 15    Comment: Performed at [facility], [address] [place], [place] [address]  Lipid panel     Status: Abnormal   Collection Time: 07/21/20  6:17 AM  Result Value Ref Range   Cholesterol 289 (H) 0 - 200 mg/dL   Triglycerides 290 (H) <150 mg/dL   HDL 33 (L) >40 mg/dL   Total CHOL/HDL Ratio 8.8 RATIO   VLDL 58 (H) 0 - 40 mg/dL   LDL Cholesterol 198 (H) 0 - 99 mg/dL    Comment:        Total  Cholesterol/HDL:CHD Risk Coronary Heart Disease Risk Table                     Men   Women  1/2 Average Risk   3.4   3.3  Average Risk       5.0   4.4  2 X Average Risk   9.6   7.1  3 X Average Risk  23.4   11.0        Use the calculated Patient Ratio above and the CHD Risk Table to determine the patient's CHD Risk.        ATP III CLASSIFICATION (LDL):  <100     mg/dL   Optimal  100-129  mg/dL   Near or Above                    Optimal  130-159  mg/dL   Borderline  160-189  mg/dL   High  >190     mg/dL   Very High Performed at [facility], [address] [place], [place] [address]   Hepatic function panel     Status: Abnormal   Collection Time: 07/21/20  6:17 AM  Result Value Ref Range   Total Protein 5.9 (L) 6.5 - 8.1 g/dL   Albumin 2.8 (L) 3.5 - 5.0 g/dL   AST 98 (H) 15 - 41 U/L   ALT 92 (H) 0 - 44 U/L   Alkaline Phosphatase 71 38 - 126 U/L   Total Bilirubin 0.8 0.3 - 1.2 mg/dL   Bilirubin, Direct 0.2 0.0 - 0.2 mg/dL   Indirect Bilirubin 0.6 0.3 - 0.9 mg/dL    Comment: Performed at [facility], [address] [place], [place] [address]  Glucose, capillary     Status: Abnormal   Collection Time: 07/21/20  7:41 AM  Result Value Ref Range   Glucose-Capillary 141 (H) 70 - 99 mg/dL    Comment: Glucose reference range applies only to samples taken after fasting for at least 8 hours.  Fibrinogen     Status: None   Collection Time: 07/21/20 11:54 AM  Result Value Ref Range   Fibrinogen 286 210 - 475 mg/dL    Comment: Performed at [facility], [address] [place], [place] [address]  Protime-INR     Status: None   Collection Time: 07/21/20 11:54 AM  Result Value Ref Range   Prothrombin Time 13.0 11.4 - 15.2 seconds   INR 1.0 0.8 - 1.2    Comment: (NOTE) INR goal varies based on device and disease states. Performed at [facility], [address] [place], [place] [address]   Fibrin derivatives D-Dimer [facility] only)      Status: Abnormal   Collection Time: 07/21/20 11:54 AM  Result Value Ref Range   Fibrin derivatives D-dimer ([facility]) 5,349.15 (H) 0.00 -  499.00 ng/mL (FEU)    Comment: (NOTE) <> Exclusion of Venous Thromboembolism (VTE) - OUTPATIENT ONLY   (Emergency Department or [place])    0-499 ng/ml (FEU): With a low to intermediate pretest probability                      for VTE this test result excludes the diagnosis                      of VTE.   >499 ng/ml (FEU) : VTE not excluded; additional work up for VTE is                      required.  <> Testing on Inpatients and Evaluation of Disseminated Intravascular   Coagulation (DIC) Reference Range:   0-499 ng/ml (FEU) Performed at [facility], [address] [place], [place] [address]   CBC     Status: Abnormal   Collection Time: 07/21/20 11:54 AM  Result Value Ref Range   WBC 22.8 (H) 4.0 - 10.5 K/uL   RBC 4.36 3.87 - 5.11 MIL/uL   Hemoglobin 13.4 12.0 - 15.0 g/dL   HCT 43.4 36 - 46 %   MCV 99.5 80.0 - 100.0 fL   MCH 30.7 26.0 - 34.0 pg   MCHC 30.9 30.0 - 36.0 g/dL   RDW 14.0 11.5 - 15.5 %   Platelets 172 150 - 400 K/uL   nRBC 0.0 0.0 - 0.2 %    Comment: Performed at [facility], [address] [place], [place] [address]  Haptoglobin     Status: None   Collection Time: 07/21/20 11:54 AM  Result Value Ref Range   Haptoglobin 193 37 - 355 mg/dL    Comment: (NOTE) Performed At: [facility] [place], [place] [id number] Rush Farmer MD [id number]   Lactate dehydrogenase     Status: Abnormal   Collection Time: 07/21/20 11:54 AM  Result Value Ref Range   LDH 368 (H) 98 - 192 U/L    Comment: Performed at [facility], [address] [place], [place] [address]  Glucose, capillary     Status: Abnormal   Collection Time: 07/21/20 12:59 PM  Result Value Ref Range   Glucose-Capillary 173 (H) 70 - 99 mg/dL    Comment: Glucose reference range applies only to samples taken after  fasting for at least 8 hours.  Glucose, capillary     Status: Abnormal   Collection Time: 07/21/20  4:29 PM  Result Value Ref Range   Glucose-Capillary 173 (H) 70 - 99 mg/dL    Comment: Glucose reference range applies only to samples taken after fasting for at least 8 hours.  Glucose, capillary     Status: Abnormal   Collection Time: 07/21/20  8:05 PM  Result Value Ref Range   Glucose-Capillary 231 (H) 70 - 99 mg/dL    Comment: Glucose reference range applies only to samples taken after fasting for at least 8 hours.  Glucose, capillary     Status: Abnormal   Collection Time: 07/22/20 12:34 AM  Result Value Ref Range   Glucose-Capillary 195 (H) 70 - 99 mg/dL    Comment: Glucose reference range applies only to samples taken after fasting for at least 8 hours.   Comment 1 Notify RN   CBC with Differential/Platelet     Status: Abnormal   Collection Time: 07/22/20  3:04 AM  Result Value Ref Range  WBC 21.6 (H) 4.0 - 10.5 K/uL   RBC 4.20 3.87 - 5.11 MIL/uL   Hemoglobin 12.8 12.0 - 15.0 g/dL   HCT 40.9 36 - 46 %   MCV 97.4 80.0 - 100.0 fL   MCH 30.5 26.0 - 34.0 pg   MCHC 31.3 30.0 - 36.0 g/dL   RDW 14.0 11.5 - 15.5 %   Platelets 198 150 - 400 K/uL   nRBC 0.0 0.0 - 0.2 %   Neutrophils Relative % 86 %   Neutro Abs 18.5 (H) 1.7 - 7.7 K/uL   Lymphocytes Relative 3 %   Lymphs Abs 0.7 0.7 - 4.0 K/uL   Monocytes Relative 9 %   Monocytes Absolute 2.0 (H) 0 - 1 K/uL   Eosinophils Relative 0 %   Eosinophils Absolute 0.0 0 - 0 K/uL   Basophils Relative 0 %   Basophils Absolute 0.1 0 - 0 K/uL   Immature Granulocytes 2 %   Abs Immature Granulocytes 0.39 (H) 0.00 - 0.07 K/uL    Comment: Performed at [facility], [address] [place], [place] [address]  Phosphorus     Status: None   Collection Time: 07/22/20  3:04 AM  Result Value Ref Range   Phosphorus 4.0 2.5 - 4.6 mg/dL    Comment: Performed at [facility], [address] [place], [place] [address]  Magnesium      Status: None   Collection Time: 07/22/20  3:04 AM  Result Value Ref Range   Magnesium 1.7 1.7 - 2.4 mg/dL    Comment: Performed at [facility], [address], [place], [place] [address]  Basic metabolic panel     Status: Abnormal   Collection Time: 07/22/20  3:04 AM  Result Value Ref Range   Sodium 143 135 - 145 mmol/L   Potassium 4.5 3.5 - 5.1 mmol/L   Chloride 102 98 - 111 mmol/L   CO2 28 22 - 32 mmol/L   Glucose, Bld 152 (H) 70 - 99 mg/dL    Comment: Glucose reference range applies only to samples taken after fasting for at least 8 hours.   BUN 40 (H) 8 - 23 mg/dL   Creatinine, Ser 1.18 (H) 0.44 - 1.00 mg/dL   Calcium 8.4 (L) 8.9 - 10.3 mg/dL   GFR calc non Af Amer 48 (L) >60 mL/min   Anion gap 13 5 - 15    Comment: Performed at [facility], [address] [place], [place] [address]  Glucose, capillary     Status: Abnormal   Collection Time: 07/22/20  4:41 AM  Result Value Ref Range   Glucose-Capillary 155 (H) 70 - 99 mg/dL    Comment: Glucose reference range applies only to samples taken after fasting for at least 8 hours.   Comment 1 Notify RN   Glucose, capillary     Status: Abnormal   Collection Time: 07/22/20  7:38 AM  Result Value Ref Range   Glucose-Capillary 134 (H) 70 - 99 mg/dL    Comment: Glucose reference range applies only to samples taken after fasting for at least 8 hours.   Comment 1 Notify RN   Glucose, capillary     Status: Abnormal   Collection Time: 07/22/20 12:49 PM  Result Value Ref Range   Glucose-Capillary 159 (H) 70 - 99 mg/dL    Comment: Glucose reference range applies only to samples taken after fasting for at least 8 hours.   Comment 1 Notify RN   Glucose,  capillary     Status: Abnormal   Collection Time: 07/22/20  3:52 PM  Result Value Ref Range   Glucose-Capillary 266 (H) 70 - 99 mg/dL    Comment: Glucose reference range applies only to samples taken after fasting for at least 8 hours.   Comment 1 Notify RN    Glucose, capillary     Status: Abnormal   Collection Time: 07/22/20  7:43 PM  Result Value Ref Range   Glucose-Capillary 212 (H) 70 - 99 mg/dL    Comment: Glucose reference range applies only to samples taken after fasting for at least 8 hours.   MR BRAIN WO CONTRAST  Result Date: 07/21/2020 CLINICAL DATA:  64 year old female code stroke presentation on 07/16/2020. Several motion degraded exams, but diagnostic MRI yesterday suggesting a few punctate foci of white matter restricted diffusion such as due to small acute or subacute embolic infarcts. Repeat DWI, FLAIR and SWI imaging requested today. EXAM: MRI HEAD WITHOUT CONTRAST TECHNIQUE: Multiplanar, multiecho pulse sequences of the brain and surrounding structures were obtained without intravenous contrast. COMPARISON:  Brain MRI 07/20/2020 and earlier. FINDINGS: Study is intermittently degraded by motion artifact despite repeated imaging attempts. Brain: No intracranial mass effect or ventriculomegaly. Stable cerebral morphology since yesterday. Widespread, confluent cerebral white matter T2 and FLAIR hyperintensity again noted. Similar moderate T2 and FLAIR heterogeneity in the pons. Repeat DWI imaging is of slightly lesser quality than the yesterday. There remain only small vague areas of abnormal trace diffusion (such as in the right corona radiata series 5, image 29 and right parietal subcortical white matter on image 30). No new or progressive diffusion abnormality. SWI imaging is compared to T2 * imaging yesterday. No evidence of acute or chronic intracranial blood products. Vascular: Major intracranial vascular flow voids appear stable. Other: Stable mild mastoid effusion. Visible nasopharynx remains negative. IMPRESSION: 1. Unchanged DWI from yesterday with small, vague foci of trace diffusion abnormality suggesting scattered small foci of subacute white matter ischemia. Underlying advanced but nonspecific chronic signal changes in the cerebral  white matter and the pons redemonstrated, most commonly due to small vessel disease. 2. Negative SWI imaging. 3. No new intracranial abnormality is evident. Electronically Signed   By: Genevie Ann M.D.   On: 07/21/2020 15:26    Blood pressure 140/71, pulse 82, temperature 97.7 F (36.5 C), temperature source Axillary, resp. rate 17, height 5' 7.01" (1.702 m), weight 86.3 kg, SpO2 96 %.  Assessment 1. Sepsis secondary to Klebsiella bacteremia due to UTI/pneumonia 2. Right and left foot ischemia  Plan -Patient seen and examined.  Unchanged since last visit. -Appears to have stable ischemic changes to the left and right forefeet.  Unknown whether or not the digits will return back to normal but at this time both feet appear to be very cold to touch and pulses are not palpable.  Capillary fill time does not appear to be returning currently to the left dorsal forefoot to the level of the midfoot and digits 1 through 4 have absent capillary fill time.  The right hallux distal tip also has absent capillary fill time. -Discussed concern that potential gangrenous necrosis may ensue which may require subsequent amputation. -Vascular surgery has been consulted.  Discussed with Dr. Kathyrn Sheriff, Stacie [name], in detail.  No plans for intervention currently. -Discussed with patient and family that we need to let these areas demarcate further to see what will live and what will not.  This will determine the course of potential amputations in the future.  Currently patient's blood flow appears to be compromised so amputations may not result in an optimal outcome.  Patient may require more proximal amputations if blood flow status does not improve. -We will continue to monitor.  Currently no acute signs of infection seen to both feet.  Caroline More, DPM 07/22/2020, 8:42 PM

## 2020-07-23 LAB — GLUCOSE, CAPILLARY
Glucose-Capillary: 119 mg/dL — ABNORMAL HIGH (ref 70–99)
Glucose-Capillary: 107 mg/dL — ABNORMAL HIGH (ref 70–99)
Glucose-Capillary: 161 mg/dL — ABNORMAL HIGH (ref 70–99)
Glucose-Capillary: 197 mg/dL — ABNORMAL HIGH (ref 70–99)
Glucose-Capillary: 223 mg/dL — ABNORMAL HIGH (ref 70–99)
Glucose-Capillary: 163 mg/dL — ABNORMAL HIGH (ref 70–99)

## 2020-07-23 LAB — CBC WITH DIFFERENTIAL/PLATELET
Abs Immature Granulocytes: 0.31 10*3/uL — ABNORMAL HIGH (ref 0.00–0.07)
Basophils Absolute: 0.1 10*3/uL (ref 0.0–0.1)
Basophils Relative: 0 %
Eosinophils Absolute: 0.1 10*3/uL (ref 0.0–0.5)
Eosinophils Relative: 1 %
HCT: 40.4 % (ref 36.0–46.0)
Hemoglobin: 12.9 g/dL (ref 12.0–15.0)
Immature Granulocytes: 2 %
Lymphocytes Relative: 5 %
Lymphs Abs: 0.9 10*3/uL (ref 0.7–4.0)
MCH: 30.8 pg (ref 26.0–34.0)
MCHC: 31.9 g/dL (ref 30.0–36.0)
MCV: 96.4 fL (ref 80.0–100.0)
Monocytes Absolute: 2 10*3/uL — ABNORMAL HIGH (ref 0.1–1.0)
Monocytes Relative: 10 %
Neutro Abs: 15.8 10*3/uL — ABNORMAL HIGH (ref 1.7–7.7)
Neutrophils Relative %: 82 %
Platelets: 236 10*3/uL (ref 150–400)
RBC: 4.19 MIL/uL (ref 3.87–5.11)
RDW: 14.6 % (ref 11.5–15.5)
WBC: 19.1 10*3/uL — ABNORMAL HIGH (ref 4.0–10.5)
nRBC: 0 % (ref 0.0–0.2)

## 2020-07-23 LAB — BASIC METABOLIC PANEL
Anion gap: 12 (ref 5–15)
BUN: 38 mg/dL — ABNORMAL HIGH (ref 8–23)
CO2: 32 mmol/L (ref 22–32)
Calcium: 8.8 mg/dL — ABNORMAL LOW (ref 8.9–10.3)
Chloride: 99 mmol/L (ref 98–111)
Creatinine, Ser: 1.2 mg/dL — ABNORMAL HIGH (ref 0.44–1.00)
GFR calc non Af Amer: 47 mL/min — ABNORMAL LOW (ref >60)
Glucose, Bld: 108 mg/dL — ABNORMAL HIGH (ref 70–99)
Potassium: 3.7 mmol/L (ref 3.5–5.1)
Sodium: 143 mmol/L (ref 135–145)

## 2020-07-23 LAB — MAGNESIUM: Magnesium: 2.1 mg/dL (ref 1.7–2.4)

## 2020-07-23 LAB — PHOSPHORUS: Phosphorus: 3.5 mg/dL (ref 2.5–4.6)

## 2020-07-23 MED ORDER — PANTOPRAZOLE SODIUM 40 MG PO TBEC
40.0000 mg | DELAYED_RELEASE_TABLET | Freq: Every day | ORAL | Status: DC
  Administered 2020-07-24 – 2020-07-28 (×5): 40 mg via ORAL
  Filled 2020-07-23 (×5): qty 1

## 2020-07-23 MED ORDER — BACID PO TABS: 2.0000 | ORAL_TABLET | Freq: Three times a day (TID) | ORAL | Status: DC

## 2020-07-23 MED ORDER — SODIUM CHLORIDE 0.9 % IV SOLN: INTRAVENOUS | Status: DC

## 2020-07-23 MED ORDER — FLUCONAZOLE 100 MG PO TABS
100.0000 mg | ORAL_TABLET | Freq: Every day | ORAL | Status: DC
  Administered 2020-07-24: 100 mg via ORAL
  Filled 2020-07-23 (×3): qty 1

## 2020-07-23 NOTE — Progress Notes (Signed)
[facility], [place] 07/23/20  Subjective:   Hospital day # 13  Patient sitting up in bed, consuming her breakfast, reports fair appetite, family member at the bedside.  She denies nausea, vomiting, or shortness of breath.   Renal: 10/06 0701 - 10/07 0700 In: -  Out: 500 [Urine:500]   Objective:  Vital signs in last 24 hours:  Temp:  [97.7 F (36.5 C)-98 F (36.7 C)] 97.7 F (36.5 C) (10/07 0738) Pulse Rate:  [82-101] 95 (10/07 0738) Resp:  [16-20] 20 (10/07 0738) BP: (127-164)/(49-84) 127/51 (10/07 0738) SpO2:  [96 %-100 %] 98 % (10/07 0738) Weight:  [85.1 kg] 85.1 kg (10/07 0500)  Weight change:  Filed Weights   07/16/20 0500 07/19/20 0500 07/23/20 0500  Weight: 87.5 kg 86.3 kg 85.1 kg    Intake/Output:    Intake/Output Summary (Last 24 hours) at 07/23/2020 1027 Last data filed at 07/23/2020 0511 Gross per 24 hour  Intake --  Output 500 ml  Net -500 ml     Physical Exam: General:  Sitting up in bed, consuming breakfast,communicating pleasantly  HEENT  normocephalic, atraumatic cannula in place with 2 L of oxygen  Pulm/lungs   normal and symmetrical respiratory effort, respirations even, unlabored  CVS/Heart  S1S2 + regular  Abdomen:   Soft, nondistended, nontender  Extremities:  No peripheral edema, purplish discoloration of toes +  Neurologic:  Normal affect, speech clear and appropriate  Skin:  No acute lesions or rashes,Necrotic Left 2nd, 3rd toes    Basic Metabolic Panel:  Recent Labs  Lab 07/19/20 0530 07/19/20 0530 07/20/20 0415 07/20/20 0415 07/21/20 0617 07/22/20 0304 07/23/20 0413  NA 145  --  148*  --  141 143 143  K 3.8  --  4.0  --  4.2 4.5 3.7  CL 102  --  104  --  101 102 99  CO2 33*  --  32  --  29 28 32  GLUCOSE 148*  --  124*  --  144* 152* 108*  BUN 41*  --  40*  --  41* 40* 38*  CREATININE 1.13*  --  1.19*  --  1.18* 1.18* 1.20*  CALCIUM 7.5*   < > 8.0*   < > 8.0* 8.4* 8.8*  MG 1.7  --  2.2  --   1.8 1.7 2.1  PHOS 2.7  --  3.2  --  4.2 4.0 3.5   < > = values in this interval not displayed.     CBC: Recent Labs  Lab 07/19/20 0530 07/19/20 0530 07/20/20 0415 07/21/20 0617 07/21/20 1154 07/22/20 0304 07/23/20 0413  WBC 26.1*   < > 22.3* 20.6* 22.8* 21.6* 19.1*  NEUTROABS 22.2*  --  18.9* 17.1*  --  18.5* 15.8*  HGB 13.6   < > 14.4 13.5 13.4 12.8 12.9  HCT 43.9   < > 45.0 43.0 43.4 40.9 40.4  MCV 100.2*   < > 96.4 99.3 99.5 97.4 96.4  PLT 97*   < > 139* 159 172 198 236   < > = values in this interval not displayed.      Lab Results  Component Value Date   HEPBSAG NON REACTIVE 07/15/2020   HEPBIGM NON REACTIVE 07/15/2020      Microbiology:  Recent Results (from the past 240 hour(s))  Culture, blood (Routine X 2) w Reflex to ID Panel     Status: None   Collection Time: 07/16/20  1:29 PM   Specimen:  BLOOD  Result Value Ref Range Status   Specimen Description BLOOD LEFT ANTECUBITAL  Final   Special Requests   Final    AEROBIC BOTTLE ONLY Blood Culture results may not be optimal due to an inadequate volume of blood received in culture bottles   Culture   Final    NO GROWTH 5 DAYS Performed at [facility], [address] [place], [place] [address]    Report Status 07/21/2020 FINAL  Final  Culture, blood (Routine X 2) w Reflex to ID Panel     Status: None   Collection Time: 07/16/20  5:09 PM   Specimen: BLOOD  Result Value Ref Range Status   Specimen Description BLOOD BLOOD RIGHT HAND  Final   Special Requests   Final    BOTTLES DRAWN AEROBIC ONLY Blood Culture results may not be optimal due to an inadequate volume of blood received in culture bottles   Culture   Final    NO GROWTH 5 DAYS Performed at [facility], [address] [place], [place] [address]    Report Status 07/21/2020 FINAL  Final    Coagulation Studies: Recent Labs    07/21/20 1154  LABPROT 13.0  INR 1.0    Urinalysis: No results for input(s): COLORURINE,  LABSPEC, PHURINE, GLUCOSEU, HGBUR, BILIRUBINUR, KETONESUR, PROTEINUR, UROBILINOGEN, NITRITE, LEUKOCYTESUR in the last 72 hours.  Invalid input(s): APPERANCEUR    Imaging: MR BRAIN WO CONTRAST  Result Date: 07/21/2020 CLINICAL DATA:  64 year old female code stroke presentation on 07/16/2020. Several motion degraded exams, but diagnostic MRI yesterday suggesting a few punctate foci of white matter restricted diffusion such as due to small acute or subacute embolic infarcts. Repeat DWI, FLAIR and SWI imaging requested today. EXAM: MRI HEAD WITHOUT CONTRAST TECHNIQUE: Multiplanar, multiecho pulse sequences of the brain and surrounding structures were obtained without intravenous contrast. COMPARISON:  Brain MRI 07/20/2020 and earlier. FINDINGS: Study is intermittently degraded by motion artifact despite repeated imaging attempts. Brain: No intracranial mass effect or ventriculomegaly. Stable cerebral morphology since yesterday. Widespread, confluent cerebral white matter T2 and FLAIR hyperintensity again noted. Similar moderate T2 and FLAIR heterogeneity in the pons. Repeat DWI imaging is of slightly lesser quality than the yesterday. There remain only small vague areas of abnormal trace diffusion (such as in the right corona radiata series 5, image 29 and right parietal subcortical white matter on image 30). No new or progressive diffusion abnormality. SWI imaging is compared to T2 * imaging yesterday. No evidence of acute or chronic intracranial blood products. Vascular: Major intracranial vascular flow voids appear stable. Other: Stable mild mastoid effusion. Visible nasopharynx remains negative. IMPRESSION: 1. Unchanged DWI from yesterday with small, vague foci of trace diffusion abnormality suggesting scattered small foci of subacute white matter ischemia. Underlying advanced but nonspecific chronic signal changes in the cerebral white matter and the pons redemonstrated, most commonly due to small vessel  disease. 2. Negative SWI imaging. 3. No new intracranial abnormality is evident. Electronically Signed   By: Genevie Ann M.D.   On: 07/21/2020 15:26     Medications:   . cefTRIAXone (ROCEPHIN)  IV 2 g (07/22/20 1810)  . dextrose 40 mL/hr at 07/23/20 0053   . arformoterol  15 mcg Nebulization BID  . aspirin EC  81 mg Oral Daily  . atorvastatin  40 mg Oral Daily  . budesonide (PULMICORT) nebulizer solution  0.25 mg Nebulization BID  . Chlorhexidine Gluconate Cloth  6 each Topical Q0600  . docusate  100 mg  Oral BID  . dorzolamide  1 drop Both Eyes BID  . escitalopram  20 mg Oral Daily  . feeding supplement (ENSURE ENLIVE)  237 mL Oral TID BM  . heparin injection (subcutaneous)  5,000 Units Subcutaneous Q8H  . insulin aspart  0-15 Units Subcutaneous TID AC & HS  . latanoprost  1 drop Right Eye QHS  . levothyroxine  137 mcg Oral Q0600  . methylPREDNISolone (SOLU-MEDROL) injection  20 mg Intravenous Daily  . multivitamin with minerals  1 tablet Oral Daily  . nystatin  5 mL Oral QID  . nystatin  1 application Topical TID  . oxybutynin  5 mg Oral QHS  . pantoprazole (PROTONIX) IV  40 mg Intravenous QHS  . polyethylene glycol  17 g Oral Daily  . thiamine injection  100 mg Intravenous Daily  . Treprostinil  18 mcg Inhalation QID   acetaminophen, bisacodyl, docusate sodium, polyethylene glycol  Assessment/ Plan:  64 y.o. female with  sarcoidosis, interstitial lung disease, obstructive sleep apnea, depression, GERD, hyperlipidemia, hypothyroidism   admitted on 07/09/2020 for Elevated LFTs [R79.89] Sepsis ([place]) [A41.9] Sepsis, due to unspecified organism, unspecified whether acute organ dysfunction present [facility]) [A41.9]  # AKI Baseline Cr 1.0 in May 2021 Admi Cr 4.16, peaked at 4.57 Renal function remaining relatively stable IVF can be discontinued if oral intake is adequate Lab Results  Component Value Date   CREATININE 1.20 (H) 07/23/2020   CREATININE 1.18 (H) 07/22/2020   CREATININE  1.18 (H) 07/21/2020     #Sepsis from urinary source Klebsiella pneumonia and blood in urine Patient has significant history of renal stones.  History of left ureteroscopy May 2021 Urology placed ureteral stents bilaterally on 07/14/20 She is on Ceftriaxone started on 07/10/20, plan to continue until 07/26/2020 per ID recommendation  #History of pulmonary sarcoidosis, pulmonary hypertension Acute on chronic respiratory failure, community-acquired pneumonia  requires home oxygen and CPAP Was intubated and mechanically ventilated - now extubated -Patient breathing comfortably, on 2L supplemental O2 via [place].  # Hypernatremia Serum sodium stable at 143  Encourage oral intake Can D/C IVF if oral intake is adequate   LOS: 13 Stacie [name] 10/7/202110:27 [place], [place]  Note: This note was prepared with Dragon dictation. Any transcription errors are unintentional

## 2020-07-23 NOTE — Progress Notes (Signed)
[place] for Electrolyte Monitoring and Replacement   Recent Labs: Potassium (mmol/L)  Date Value  07/23/2020 3.7  12/28/2012 3.4 (L)   Magnesium (mg/dL)  Date Value  07/23/2020 2.1   Calcium (mg/dL)  Date Value  07/23/2020 8.8 (L)   Calcium, Total (mg/dL)  Date Value  12/28/2012 8.0 (L)   Albumin (g/dL)  Date Value  07/21/2020 2.8 (L)  05/27/2019 4.1  12/26/2012 3.0 (L)   Phosphorus (mg/dL)  Date Value  07/23/2020 3.5   Sodium (mmol/L)  Date Value  07/23/2020 143  05/27/2019 144  12/28/2012 142   Corrected Ca: 8.96 mg/dL  Assessment: Pharmacy consulted to replace electrolytes in this 64 year old female admitted with K pneumoniae bacteremia. Pt with PMH of Stage 3 CKD and baseline Scr of 1.0 in May 2021. Nephrology is following. Electrolytes are WNL today.  MIVF: 5% dextrose at 40 mL/hr  Goal of Therapy:  Electrolytes WNL   Plan:   No electrolyte replacement needed today.   F/u electrolytes with am labs  Rowland Lathe, PharmD 07/23/2020 7:52 AM

## 2020-07-23 NOTE — Progress Notes (Signed)
ID Nurse wanted to check for cdiff Pt has been constipated all along and has received miralax and docusate. Last documented miralax was 07/22/20 Today patient had a big bowel movt  Which was loose Stacie [name]  Currently no need to test for cdiff .as there is no clinical indication. This is laxative induced diarrhea She was having lower abdominal pain which was due to  urinary retention (which could be due to constipation) --in/out cath yielded 900cc Discussed with her nurse, Dr.Patel and called and spoke to Dr.Anstey as well

## 2020-07-23 NOTE — Consult Note (Signed)
Cardiology Consultation Note    Patient ID: Stacie [name], MRN: [id number], DOB/AGE: August 07, 1956 64 y.o. Admit date: 07/09/2020   Date of Consult: 07/23/2020 Primary Physician: Glean Hess, MD Primary Cardiologist:    Chief Complaint: sob/bacteremia/cva Reason for Consultation: for tee Requesting MD: Dr. Posey Pronto  HPI: Stacie [name] is a 64 y.o. female with history of varicose64 y.o. Female with a past medical history significant for pulmonary sarcoidosis (on 2L [place] at baseline), pulmonary hypertension, OSA, asthma, hypertension, hypothyroidism, IBS, and chronic kidney disease who presents to [facility] ED on 07/09/20 due to altered mental status and shortness of breath.   Past Medical History:  Diagnosis Date  . Arrhythmia    patient unaware if this is current  . Asthma   . GERD (gastroesophageal reflux disease)   . Heart murmur   . History of kidney stones   . HOH (hard of hearing)    wear aids  . Hypothyroidism   . IBS (irritable bowel syndrome)   . Pulmonary hypertension ([place])   . Sarcoid   . Sarcoidosis   . Seasonal allergies   . Sleep apnea CPAP with O2  . Wears hearing aid in both ears       Surgical History:  Past Surgical History:  Procedure Laterality Date  . CARDIAC CATHETERIZATION  10/18/2018   [facility]  . CATARACT EXTRACTION W/PHACO Left 07/31/2019   Procedure: CATARACT EXTRACTION PHACO AND INTRAOCULAR LENS PLACEMENT (IOC) LEFT 00:51.1  17.9%  9.15;  Surgeon: Leandrew Koyanagi, MD;  Location: [facility];  Service: Ophthalmology;  Laterality: Left;  keep this patient second  . COLON SURGERY     "colon was fused to bladder - operated on both"  . COLONOSCOPY  09/18/2007   diverticuli, no polyps  . COLONOSCOPY  05/26/2010   diverticuli, no polyps  . CYSTOSCOPY WITH STENT PLACEMENT Bilateral 07/14/2020   Procedure: CYSTOSCOPY WITH STENT PLACEMENT, RETROPYLOGRAM;  Surgeon: Billey Co, MD;  Location: [facility];  Service: Urology;  Laterality:  Bilateral;  . CYSTOSCOPY/URETEROSCOPY/HOLMIUM LASER/STENT PLACEMENT Left 02/20/2020   Procedure: CYSTOSCOPY/URETEROSCOPY/LITHOTRIPSY /STENT PLACEMENT;  Surgeon: Hollice Espy, MD;  Location: [facility];  Service: Urology;  Laterality: Left;  . PARS PLANA VITRECTOMY Right 05/20/2015   Procedure: PARS PLANA VITRECTOMY WITH 25 GAUGE, laser;  Surgeon: Milus Height, MD;  Location: [facility];  Service: Ophthalmology;  Laterality: Right;  . PARTIAL HYSTERECTOMY  1990  . TUBAL LIGATION       Home Meds: Prior to Admission medications   Medication Sig Start Date End Date Taking? Authorizing Provider  budesonide-formoterol (SYMBICORT) 80-4.5 MCG/ACT inhaler Take 2 puffs first thing in am and then another 2 puffs about 12 hours later. Patient taking differently: Inhale 2 puffs into the lungs in the morning and at bedtime. Take 2 puffs first thing in am and then another 2 puffs about 12 hours later. 05/08/17  Yes Tanda Rockers, MD  denosumab (PROLIA) 60 MG/ML SOSY injection Inject 60 mg into the skin every 6 (six) months.   Yes [provider]  dorzolamide (TRUSOPT) 2 % ophthalmic solution Place 1 drop into both eyes 2 (two) times daily.   Yes [provider]  escitalopram (LEXAPRO) 20 MG tablet  03/05/20  Yes [provider]  folic acid (FOLVITE) 1 MG tablet Take 1 mg by mouth daily. 07/30/18  Yes [provider]  latanoprost (XALATAN) 0.005 % ophthalmic solution Place 1 drop into the right eye at bedtime.   Yes [provider]  methotrexate (TREXALL) 5 MG tablet TAKE 5 TABLETS (25 MG TOTAL) BY MOUTH EVERY 7 (SEVEN) DAYS 03/04/20  Yes [provider]  Multiple Vitamins-Minerals (MULTIVITAMIN WITH MINERALS) tablet Take 1 tablet by mouth daily.   Yes [provider]  NON FORMULARY CPAP nightly   Yes [provider]  predniSONE (DELTASONE) 10 MG tablet Take 10 mg by mouth daily with breakfast. 12.5 mg 03/10/19  Yes [provider]   SYNTHROID 137 MCG tablet TAKE 1 TABLET (137 MCG TOTAL) BY MOUTH DAILY BEFORE BREAKFAST. 04/04/20  Yes Glean Hess, MD  Treprostinil (TYVASO) 0.6 MG/ML SOLN Inhale 18 mcg into the lungs 4 (four) times daily. 12 breaths 4 times a day   Yes [provider]  albuterol (VENTOLIN HFA) 108 (90 Base) MCG/ACT inhaler Inhale 2 puffs into the lungs every 6 (six) hours as needed for wheezing. Patient not taking: Reported on 07/10/2020 11/19/15   Laverle Hobby, MD  brimonidine (ALPHAGAN) 0.2 % ophthalmic solution Place 1 drop into both eyes 3 (three) times daily.  Patient not taking: Reported on 05/12/2020    [provider]  calcium carbonate (TUMS - DOSED IN MG ELEMENTAL CALCIUM) 500 MG chewable tablet Chew 1 tablet by mouth daily as needed for indigestion or heartburn. Patient not taking: Reported on 07/10/2020    [provider]  cephALEXin (KEFLEX) 500 MG capsule Take 1 capsule (500 mg total) by mouth 3 (three) times daily. Patient not taking: Reported on 06/12/2020 04/07/20   Hollice Espy, MD  conjugated estrogens (PREMARIN) vaginal cream Apply one pea-sized amount around the opening of the urethra three times weekly. Patient not taking: Reported on 07/10/2020 05/12/20   Debroah Loop, PA-C  methotrexate (RHEUMATREX) 2.5 MG tablet Take by mouth. Patient not taking: Reported on 07/10/2020 03/06/20   [provider]  mirabegron ER (MYRBETRIQ) 25 MG TB24 tablet Take 1 tablet (25 mg total) by mouth daily. Patient not taking: Reported on 07/10/2020 06/12/20   Debroah Loop, PA-C  oxybutynin (DITROPAN) 5 MG tablet  02/20/20   [provider]  oxyCODONE-acetaminophen (PERCOCET/ROXICET) 5-325 MG tablet  02/20/20   [provider]  OXYGEN Inhale 2 L into the lungs as needed.    [provider]  Respiratory Therapy Supplies (FLUTTER) DEVI Use as directed Patient not taking: Reported on 07/10/2020 10/26/17   Tanda Rockers, MD   VITAMIN D PO Take 1 tablet by mouth once a week. Patient not taking: Reported on 07/10/2020    [provider]  Vitamin D, Ergocalciferol, (DRISDOL) 1.25 MG (50000 UNIT) CAPS capsule Take 50,000 Units by mouth once a week. Patient not taking: Reported on 06/12/2020 03/17/20   [provider]    Inpatient Medications:  . arformoterol  15 mcg Nebulization BID  . aspirin EC  81 mg Oral Daily  . atorvastatin  40 mg Oral Daily  . budesonide (PULMICORT) nebulizer solution  0.25 mg Nebulization BID  . Chlorhexidine Gluconate Cloth  6 each Topical Q0600  . docusate  100 mg Oral BID  . dorzolamide  1 drop Both Eyes BID  . escitalopram  20 mg Oral Daily  . feeding supplement (ENSURE ENLIVE)  237 mL Oral TID BM  . heparin injection (subcutaneous)  5,000 Units Subcutaneous Q8H  . insulin aspart  0-15 Units Subcutaneous TID AC & HS  . latanoprost  1 drop Right Eye QHS  . levothyroxine  137 mcg Oral Q0600  . methylPREDNISolone (SOLU-MEDROL) injection  20 mg Intravenous Daily  .  multivitamin with minerals  1 tablet Oral Daily  . nystatin  5 mL Oral QID  . nystatin  1 application Topical TID  . oxybutynin  5 mg Oral QHS  . pantoprazole (PROTONIX) IV  40 mg Intravenous QHS  . polyethylene glycol  17 g Oral Daily  . thiamine injection  100 mg Intravenous Daily  . Treprostinil  18 mcg Inhalation QID   . cefTRIAXone (ROCEPHIN)  IV 2 g (07/22/20 1810)  . dextrose 40 mL/hr at 07/23/20 0053    Allergies:  Allergies  Allergen Reactions  . Nitrofurantoin Nausea Only  . Tramadol     Nausea and vomiting  . Sulfa Antibiotics Rash    As an infant    Social History   Socioeconomic History  . Marital status: Married    Spouse name: Not on file  . Number of children: Not on file  . Years of education: Not on file  . Highest education level: Not on file  Occupational History  . Occupation: Homemaker  Tobacco Use  . Smoking status: Never Smoker  . Smokeless tobacco: Never Used   Vaping Use  . Vaping Use: Never used  Substance and Sexual Activity  . Alcohol use: No  . Drug use: No  . Sexual activity: Not on file  Other Topics Concern  . Not on file  Social History Narrative  . Not on file   Social Determinants of Health   Financial Resource Strain:   . Difficulty of Paying Living Expenses: Not on file  Food Insecurity:   . Worried About Charity fundraiser in the Last Year: Not on file  . Ran Out of Food in the Last Year: Not on file  Transportation Needs:   . Lack of Transportation (Medical): Not on file  . Lack of Transportation (Non-Medical): Not on file  Physical Activity:   . Days of Exercise per Week: Not on file  . Minutes of Exercise per Session: Not on file  Stress:   . Feeling of Stress : Not on file  Social Connections:   . Frequency of Communication with Friends and Family: Not on file  . Frequency of Social Gatherings with Friends and Family: Not on file  . Attends Religious Services: Not on file  . Active Member of Clubs or Organizations: Not on file  . Attends Archivist Meetings: Not on file  . Marital Status: Not on file  Intimate Partner Violence:   . Fear of Current or Ex-Partner: Not on file  . Emotionally Abused: Not on file  . Physically Abused: Not on file  . Sexually Abused: Not on file     Family History  Problem Relation Age of Onset  . Allergies Father   . Asthma Father   . Colon cancer Father   . Allergies Brother   . Asthma Brother   . Breast cancer Maternal Grandmother      Review of Systems: A 12-system review of systems was performed and is negative except as noted in the HPI.  Labs: No results for input(s): CKTOTAL, CKMB, TROPONINI in the last 72 hours. Lab Results  Component Value Date   WBC 19.1 (H) 07/23/2020   HGB 12.9 07/23/2020   HCT 40.4 07/23/2020   MCV 96.4 07/23/2020   PLT 236 07/23/2020    Recent Labs  Lab 07/21/20 0617 07/22/20 0304 07/23/20 0413  NA 141   < > 143  K  4.2   < > 3.7  CL 101   < >  99  CO2 29   < > 32  BUN 41*   < > 38*  CREATININE 1.18*   < > 1.20*  CALCIUM 8.0*   < > 8.8*  PROT 5.9*  --   --   BILITOT 0.8  --   --   ALKPHOS 71  --   --   ALT 92*  --   --   AST 98*  --   --   GLUCOSE 144*   < > 108*   < > = values in this interval not displayed.   Lab Results  Component Value Date   CHOL 289 (H) 07/21/2020   HDL 33 (L) 07/21/2020   LDLCALC 198 (H) 07/21/2020   TRIG 290 (H) 07/21/2020   No results found for: DDIMER  Radiology/Studies:  EEG  Result Date: 07/16/2020 [name], MD     07/16/2020  5:23 PM ELECTROENCEPHALOGRAM REPORT Patient: Stacie [name]       Room #: IC11A EEG No. ID: [id number] Age: 64 y.o.        Sex: female Requesting Physician: Lanney Gins Report Date:  07/16/2020       Interpreting Physician: [name] History: Aalyah LENNIS RADER is an 64 y.o. female with altered mental status and right sided weakness Medications: Brovana, ASA, Rocephin, Colace, Insulin, Solumedrol, Tyvaso Conditions of Recording:  This is a 21 channel routine scalp EEG performed with bipolar and monopolar montages arranged in accordance to the international 10/20 system of electrode placement. One channel was dedicated to EKG recording. The patient is in the awake and uncooperative state. Description:  Artifact is prominent during the recording often obscuring the background rhythm. When able to be visualized the background is slow and poorly organized.   It consists of low voltage activity in the delta-theta continuum.  For the most [name] this activity is continuous.  There are some rare short periods of generalized attenuation noted.  There are also some rare periodic discharges of triphasic morphology noted.  No epileptiform activity is noted.  Hyperventilation and ntermittent photic stimulation were not performed. IMPRESSION: This is an abnormal EEG secondary to general background slowing with rare triphasic waves noted.  This finding may be  seen with a diffuse cerebral disturbance that is etiologically nonspecific, but may include a metabolic encephalopathy, among other possibilities.  No epileptiform activity was noted.  [name], MD Neurology [phone] 07/16/2020, 5:18 PM   CT ABDOMEN PELVIS WO CONTRAST  Result Date: 07/17/2020 CLINICAL DATA:  Leukocytosis.  History of ureteral stents. EXAM: CT ABDOMEN AND PELVIS WITHOUT CONTRAST TECHNIQUE: Multidetector CT imaging of the abdomen and pelvis was performed following the standard protocol without IV contrast. COMPARISON:  July 14, 2020. FINDINGS: Lower chest: Stable chronic findings seen consistent with history of sarcoidosis. Hepatobiliary: No focal liver abnormality is seen. No gallstones, gallbladder wall thickening, or biliary dilatation. Pancreas: Unremarkable. No pancreatic ductal dilatation or surrounding inflammatory changes. Spleen: Normal in size without focal abnormality. Adrenals/Urinary Tract: Adrenal glands appear normal. Small nonobstructive right renal calculi are noted. Bilateral ureteral stents are noted in grossly good position. No hydronephrosis is noted. Foley catheter is noted within urinary bladder. Stomach/Bowel: Stomach is within normal limits. Appendix appears normal. No evidence of bowel wall thickening, distention, or inflammatory changes. Vascular/Lymphatic: Aortic atherosclerosis. No enlarged abdominal or pelvic lymph nodes. Reproductive: Status post hysterectomy. No adnexal masses. Other: No abdominal wall hernia or abnormality. No abdominopelvic ascites. Musculoskeletal: No acute or significant osseous findings. IMPRESSION: 1. Small nonobstructive right renal calculi.  Bilateral ureteral stents are noted in grossly good position. No hydronephrosis is noted. 2. Stable chronic findings seen in visualized lung bases consistent with history of sarcoidosis. 3. Aortic atherosclerosis. Aortic Atherosclerosis (ICD10-I70.0). Electronically Signed   By: Marijo Conception M.D.   On: 07/17/2020 12:28   CT ABDOMEN PELVIS WO CONTRAST  Result Date: 07/14/2020 CLINICAL DATA:  Worsening bacteremia and fevers, initial encounter EXAM: CT ABDOMEN AND PELVIS WITHOUT CONTRAST TECHNIQUE: Multidetector CT imaging of the abdomen and pelvis was performed following the standard protocol without IV contrast. COMPARISON:  Ultrasound from the previous day, CT from 07/10/2020. FINDINGS: Lower chest: Some fibrotic changes are noted in the bases bilaterally consistent with the patient's given clinical history of sarcoidosis. Small pleural effusions are noted bilaterally new from the prior exam. Hepatobiliary: Liver and gallbladder are within normal limits. Minimal perihepatic fluid is noted. Pancreas: Unremarkable. No pancreatic ductal dilatation or surrounding inflammatory changes. Spleen: Normal in size without focal abnormality. Adrenals/Urinary Tract: Adrenal glands are within normal limits. Left kidney shows no renal calculi or obstructive changes. The left ureter is within normal limits. The bladder is decompressed by Foley catheter. Right kidney demonstrates significant perinephric stranding with mild hydronephrosis and proximal hydroureter. Tiny nonobstructing stone is noted in the lower pole. In the proximal right ureter there is a 7-8 mm stone identified causing the obstructive change as well as the perinephric stranding. These inflammatory changes surround the ureter throughout its course. The more distal ureter shows no obstructive change. Stomach/Bowel: No obstructive or inflammatory changes of the colon are seen. The appendix is within normal limits. No inflammatory changes are seen. Small bowel and stomach are unremarkable with the exception of a small duodenal diverticulum adjacent to the head of the pancreas. Vascular/Lymphatic: Aortic atherosclerosis. No enlarged abdominal or pelvic lymph nodes. Reproductive: Status post hysterectomy. No adnexal masses. Other: Mild free  fluid is noted within the abdomen. Musculoskeletal: Degenerative changes of lumbar spine are noted. IMPRESSION: 7-8 mm proximal right ureteral stone with obstructive change and perinephric stranding. Minimal free fluid within the abdomen and pelvis. Chronic changes in the lung bases consistent with the given clinical history of sarcoidosis. Electronically Signed   By: Inez Catalina M.D.   On: 07/14/2020 16:08   CT ANGIO HEAD W OR WO CONTRAST  Result Date: 07/16/2020 CLINICAL DATA:  Stroke suspected.  Unable to move right side. EXAM: CT ANGIOGRAPHY HEAD AND NECK CT PERFUSION BRAIN TECHNIQUE: Multidetector CT imaging of the head and neck was performed using the standard protocol during bolus administration of intravenous contrast. Multiplanar CT image reconstructions and MIPs were obtained to evaluate the vascular anatomy. Carotid stenosis measurements (when applicable) are obtained utilizing NASCET criteria, using the distal internal carotid diameter as the denominator. Multiphase CT imaging of the brain was performed following IV bolus contrast injection. Subsequent parametric perfusion maps were calculated using RAPID software. CONTRAST:  148m OMNIPAQUE IOHEXOL 350 MG/ML SOLN COMPARISON:  CT head 07/10/2020 FINDINGS: CT HEAD FINDINGS Brain: Evaluation is limited by patient motion. There is no definite evidence of acute large vascular territory infarct. There is similar extensive patchy white matter hypoattenuation, most likely the sequela of chronic microvascular ischemic disease. No acute hemorrhage. No abnormal mass effect or mass lesion. No hydrocephalus. Vascular: No hyperdense vessel identified. Calcific atherosclerosis. Skull: No evidence of acute fracture. Sinuses/Orbits: Limit evaluation without evidence of fracture or focal lesion. Other: None. ASPECTS ([facility]) Total score (0-10 with 10 being normal): 10 Review of the MIP  images confirms the above findings CTA NECK  FINDINGS Nondiagnostic arterial evaluation in the upper neck/skull base, including the internal carotid arteries in the neck and mid to distal vertebral arteries. Aortic arch: Calcified atherosclerosis without evidence of significant stenosis or aneurysm. Right carotid system: The visualized common carotid artery is within normal limits without significant stenosis or occlusion. Nondiagnostic evaluation of the distal common carotid artery and internal carotid artery to the level of the petrous carotid. Left carotid system: Calcified and noncalcified atherosclerosis of the common carotid artery without evidence of significant stenosis. Nondiagnostic evaluation of the distal common carotid artery and internal carotid artery to the level of the petrous carotid. Vertebral arteries: Approximately, no significant stenosis or occlusion. Nondiagnostic evaluation of the mid to distal vertebral arteries. Skeleton: Multilevel severe degenerative change. Other neck: No evidence of mass or adenopathy. Upper chest: Partially imaged fibrotic lung changes, possibly related to the patient's history of sarcoidosis. Review of the MIP images confirms the above findings CTA HEAD FINDINGS Anterior circulation: Calcific atherosclerosis of bilateral cavernous carotids without evidence of greater than 50% narrowing. Bilateral M1 MCAs and proximal M2 MCA is are patent without evidence of significant stenosis. Distal MCA branches appear relatively symmetric. The right A1 ACA is hypoplastic with a large left A1 ACA, likely anatomic variation. Posterior circulation: No significant stenosis or occlusion of the distal V4 vertebral arteries and basilar artery. Fetal type right PCA. No significant stenosis identified of the posterior cerebral arteries. No aneurysm. Venous sinuses: As permitted by contrast timing, patent.ww Review of the MIP images confirms the above findings CT Brain Perfusion Findings: ASPECTS: 10 CBF (<30%) Volume: 35m Perfusion  (Tmax>6.0s) volume: 658mMismatch Volume: 43m47malculated T-max greater than 6 sec and mismatch is favored artifactual given location in a region of streak/motion and non vascular territory. IMPRESSION: 1. Overall limited evaluation given patient motion with nondiagnostic arterial evaluation in the upper neck/skull base, including the internal carotid arteries and mid to distal vertebral arteries in the neck. Within this limitation, no evidence of acute intracranial abnormality, large vessel occlusion or significant (greater than 50%) arterial stenosis. Repeat CTA neck could further evaluate if clinically indicated. 2. No convincing penumbra. Small area of reported mismatch is favored artifactual. 3. Partially imaged fibrotic lung changes, possibly related to the patient's reported history of sarcoidosis. Code stroke imaging results were communicated on 07/16/2020 at 11:58 am to provider Dr. ReyDoy Mincea telephone, who verbally acknowledged these results. Electronically Signed   By: FreMargaretha Sheffield   On: 07/16/2020 12:17   DG Abd 1 View  Result Date: 07/15/2020 CLINICAL DATA:  Abdominal distension. EXAM: ABDOMEN - 1 VIEW COMPARISON:  November 18, 2014. FINDINGS: The bowel gas pattern is normal. Bilateral ureteral stents are noted. Surgical clips are seen overlying the sacrum. No radio-opaque calculi or other significant radiographic abnormality are seen. IMPRESSION: No evidence of bowel obstruction or ileus. Electronically Signed   By: JamMarijo ConceptionD.   On: 07/15/2020 08:28   CT Head Wo Contrast  Result Date: 07/10/2020 CLINICAL DATA:  Increasing confusion and weakness EXAM: CT HEAD WITHOUT CONTRAST TECHNIQUE: Contiguous axial images were obtained from the base of the skull through the vertex without intravenous contrast. COMPARISON:  None. FINDINGS: Brain: Mild chronic white matter ischemic changes are noted. No findings to suggest acute hemorrhage, acute infarction or space-occupying mass lesion is  seen. Vascular: No hyperdense vessel or unexpected calcification. Skull: Normal. Negative for fracture or focal lesion. Sinuses/Orbits: No acute finding. Other: None. IMPRESSION: Chronic  white matter ischemic changes without acute abnormality Electronically Signed   By: Inez Catalina M.D.   On: 07/10/2020 01:04   CT ANGIO NECK W OR WO CONTRAST  Result Date: 07/16/2020 CLINICAL DATA:  Stroke suspected.  Unable to move right side. EXAM: CT ANGIOGRAPHY HEAD AND NECK CT PERFUSION BRAIN TECHNIQUE: Multidetector CT imaging of the head and neck was performed using the standard protocol during bolus administration of intravenous contrast. Multiplanar CT image reconstructions and MIPs were obtained to evaluate the vascular anatomy. Carotid stenosis measurements (when applicable) are obtained utilizing NASCET criteria, using the distal internal carotid diameter as the denominator. Multiphase CT imaging of the brain was performed following IV bolus contrast injection. Subsequent parametric perfusion maps were calculated using RAPID software. CONTRAST:  12m OMNIPAQUE IOHEXOL 350 MG/ML SOLN COMPARISON:  CT head 07/10/2020 FINDINGS: CT HEAD FINDINGS Brain: Evaluation is limited by patient motion. There is no definite evidence of acute large vascular territory infarct. There is similar extensive patchy white matter hypoattenuation, most likely the sequela of chronic microvascular ischemic disease. No acute hemorrhage. No abnormal mass effect or mass lesion. No hydrocephalus. Vascular: No hyperdense vessel identified. Calcific atherosclerosis. Skull: No evidence of acute fracture. Sinuses/Orbits: Limit evaluation without evidence of fracture or focal lesion. Other: None. ASPECTS ([facility]) Total score (0-10 with 10 being normal): 10 Review of the MIP images confirms the above findings CTA NECK FINDINGS Nondiagnostic arterial evaluation in the upper neck/skull base, including the internal carotid  arteries in the neck and mid to distal vertebral arteries. Aortic arch: Calcified atherosclerosis without evidence of significant stenosis or aneurysm. Right carotid system: The visualized common carotid artery is within normal limits without significant stenosis or occlusion. Nondiagnostic evaluation of the distal common carotid artery and internal carotid artery to the level of the petrous carotid. Left carotid system: Calcified and noncalcified atherosclerosis of the common carotid artery without evidence of significant stenosis. Nondiagnostic evaluation of the distal common carotid artery and internal carotid artery to the level of the petrous carotid. Vertebral arteries: Approximately, no significant stenosis or occlusion. Nondiagnostic evaluation of the mid to distal vertebral arteries. Skeleton: Multilevel severe degenerative change. Other neck: No evidence of mass or adenopathy. Upper chest: Partially imaged fibrotic lung changes, possibly related to the patient's history of sarcoidosis. Review of the MIP images confirms the above findings CTA HEAD FINDINGS Anterior circulation: Calcific atherosclerosis of bilateral cavernous carotids without evidence of greater than 50% narrowing. Bilateral M1 MCAs and proximal M2 MCA is are patent without evidence of significant stenosis. Distal MCA branches appear relatively symmetric. The right A1 ACA is hypoplastic with a large left A1 ACA, likely anatomic variation. Posterior circulation: No significant stenosis or occlusion of the distal V4 vertebral arteries and basilar artery. Fetal type right PCA. No significant stenosis identified of the posterior cerebral arteries. No aneurysm. Venous sinuses: As permitted by contrast timing, [email or web address] Review of the MIP images confirms the above findings CT Brain Perfusion Findings: ASPECTS: 10 CBF (<30%) Volume: 055mPerfusion (Tmax>6.0s) volume: 13m73mismatch Volume: 13mL213mlculated T-max greater than 6 sec and mismatch is favored  artifactual given location in a region of streak/motion and non vascular territory. IMPRESSION: 1. Overall limited evaluation given patient motion with nondiagnostic arterial evaluation in the upper neck/skull base, including the internal carotid arteries and mid to distal vertebral arteries in the neck. Within this limitation, no evidence of acute intracranial abnormality, large vessel occlusion or significant (greater than 50%) arterial stenosis. Repeat CTA neck  could further evaluate if clinically indicated. 2. No convincing penumbra. Small area of reported mismatch is favored artifactual. 3. Partially imaged fibrotic lung changes, possibly related to the patient's reported history of sarcoidosis. Code stroke imaging results were communicated on 07/16/2020 at 11:58 am to provider Dr. Doy Mince via telephone, who verbally acknowledged these results. Electronically Signed   By: Margaretha Sheffield MD   On: 07/16/2020 12:17   CT Chest Wo Contrast  Result Date: 07/10/2020 CLINICAL DATA:  Pneumonia, dyspnea, altered mental status. Pulmonary sarcoidosis. EXAM: CT CHEST WITHOUT CONTRAST TECHNIQUE: Multidetector CT imaging of the chest was performed following the standard protocol without IV contrast. COMPARISON:  02/20/2015 FINDINGS: Cardiovascular: Cardiac size within normal limits. Mild calcification of the mitral valve annulus. No significant coronary artery calcification. No pericardial effusion. The central pulmonary arteries are enlarged in keeping with changes of pulmonary arterial hypertension. Atherosclerotic calcification is seen within the aortic arch and proximal arch vasculature as well as the descending thoracic aorta. The aorta is of normal caliber. Mediastinum/Nodes: No pathologic thoracic adenopathy. Small hiatal hernia. Lungs/Pleura: There are again identified biapical predominant peribronchovascular interstitial thickening, bronchiectasis, and fibrosis with superior retraction of the hila in keeping  with chronic changes of pulmonary sarcoidosis. Scattered ground-glass pulmonary infiltrate is best visualized within the anterior segment of the right upper lobe, but also, subtly, within the lower lobes bilaterally in keeping with active inflammatory change. No focal pulmonary nodules. No pneumothorax or pleural effusion. Upper Abdomen: Unremarkable Musculoskeletal: no acute bone abnormality. T7, T8, and T9 vertebroplasty has been performed. Chronic appearing superior endplate fracture of R15 is noted, though is new from prior examination. IMPRESSION: Chronic changes of pulmonary sarcoidosis with biapical predominant pulmonary fibrosis. Very mild superimposed active inflammatory infiltrate. Morphologic changes compatible with pulmonary arterial hypertension. Aortic Atherosclerosis (ICD10-I70.0). Electronically Signed   By: Fidela Salisbury MD   On: 07/10/2020 01:13   MR BRAIN WO CONTRAST  Result Date: 07/21/2020 CLINICAL DATA:  64 year old female code stroke presentation on 07/16/2020. Several motion degraded exams, but diagnostic MRI yesterday suggesting a few punctate foci of white matter restricted diffusion such as due to small acute or subacute embolic infarcts. Repeat DWI, FLAIR and SWI imaging requested today. EXAM: MRI HEAD WITHOUT CONTRAST TECHNIQUE: Multiplanar, multiecho pulse sequences of the brain and surrounding structures were obtained without intravenous contrast. COMPARISON:  Brain MRI 07/20/2020 and earlier. FINDINGS: Study is intermittently degraded by motion artifact despite repeated imaging attempts. Brain: No intracranial mass effect or ventriculomegaly. Stable cerebral morphology since yesterday. Widespread, confluent cerebral white matter T2 and FLAIR hyperintensity again noted. Similar moderate T2 and FLAIR heterogeneity in the pons. Repeat DWI imaging is of slightly lesser quality than the yesterday. There remain only small vague areas of abnormal trace diffusion (such as in the right  corona radiata series 5, image 29 and right parietal subcortical white matter on image 30). No new or progressive diffusion abnormality. SWI imaging is compared to T2 * imaging yesterday. No evidence of acute or chronic intracranial blood products. Vascular: Major intracranial vascular flow voids appear stable. Other: Stable mild mastoid effusion. Visible nasopharynx remains negative. IMPRESSION: 1. Unchanged DWI from yesterday with small, vague foci of trace diffusion abnormality suggesting scattered small foci of subacute white matter ischemia. Underlying advanced but nonspecific chronic signal changes in the cerebral white matter and the pons redemonstrated, most commonly due to small vessel disease. 2. Negative SWI imaging. 3. No new intracranial abnormality is evident. Electronically Signed   By: Herminio Heads.D.  On: 07/21/2020 15:26   MR BRAIN WO CONTRAST  Result Date: 07/20/2020 CLINICAL DATA:  Stroke follow-up. EXAM: MRI HEAD WITHOUT CONTRAST TECHNIQUE: Multiplanar, multiecho pulse sequences of the brain and surrounding structures were obtained without intravenous contrast. COMPARISON:  MRI of the brain July 16, 2020. FINDINGS: The study is partially degraded by motion. Brain: A few punctate foci of restricted diffusion/T2 hyperintensity are seen within the white matter of the bilateral frontal lobes, suggesting acute/subacute infarcts. No hemorrhage, hydrocephalus, extra-axial collection or mass lesion. Scattered and confluent foci of T2 hyperintensity are seen within the white matter of the cerebral hemispheres and within the pons, nonspecific, most likely related to small vessel ischemia. Vascular: Normal flow voids. Skull and upper cervical spine: Normal marrow signal. Sinuses/Orbits: Bilateral lens surgery.  Paranasal sinuses are clear Other: Left mastoid effusion. IMPRESSION: 1. A few punctate foci of restricted diffusion/T2 hyperintensity within the white matter of the bilateral frontal lobes,  suggesting acute/subacute infarcts, likely embolic. 2. Moderate to advanced white matter disease, likely chronic microangiopathy. 3. Left mastoid effusion. Electronically Signed   By: Pedro Earls M.D.   On: 07/20/2020 13:19   MR BRAIN W WO CONTRAST  Result Date: 07/16/2020 CLINICAL DATA:  Neuro deficit, acute, stroke suspected. EXAM: MRI HEAD WITHOUT AND WITH CONTRAST TECHNIQUE: Multiplanar, multiecho pulse sequences of the brain and surrounding structures were obtained without and with intravenous contrast. CONTRAST:  60m GADAVIST GADOBUTROL 1 MMOL/ML IV SOLN COMPARISON:  Same day CT imaging FINDINGS: Brain: Severely limited evaluation with multiple nondiagnostic sequences. Diffusion-weighted imaging is significantly limited with multiple small areas of DWI hyperintensity in the white matter. Patchy bilateral white matter T2/FLAIR hyperintensities, likely the sequela of chronic microvascular ischemic disease. Mild diffuse cerebral volume loss with ex vacuo ventricular dilation. No hydrocephalus. No large hemorrhage, mass lesion, or significant mass effect. Vascular: Better evaluated on same-day CT code stroke. Flow voids are grossly maintained at the skull base. Skull and upper cervical spine: Not well evaluated Sinuses/Orbits: No substantial paranasal sinus disease. No acute orbital abnormality. Other: Small left mastoid effusion. IMPRESSION: Severely limited evaluation with multiple nondiagnostic sequences. Diffusion-weighted imaging is significantly limited with multiple small areas of DWI hyperintensity in the bilateral white matter representing artifact versus small infarcts. Repeat MRI (possibly with sedation) could further evaluate if clinically indicated. Electronically Signed   By: FMargaretha SheffieldMD   On: 07/16/2020 12:46   [place]NAL  Result Date: 07/13/2020 CLINICAL DATA:  Acute renal failure. EXAM: RENAL / URINARY TRACT ULTRASOUND COMPLETE COMPARISON:  03/13/2020 FINDINGS:  Right Kidney: Renal measurements: 11.9 x 5.7 x 5.3 cm = volume: 187 mL. Echogenicity within normal limits. No mass or hydronephrosis visualized. Left Kidney: Renal measurements: 10.4 x 5.6 x 4.3 cm = volume: 129 mL. Echogenicity within normal limits. No mass or hydronephrosis visualized. Bladder: Decompressed by a Foley catheter. Other: Small volume free fluid in the right upper quadrant. IMPRESSION: 1. Unremarkable appearance of the kidneys. No hydronephrosis. 2. Small volume right upper quadrant ascites. Electronically Signed   By: ALogan BoresM.D.   On: 07/13/2020 14:30   [place]otid Bilateral  Result Date: 07/19/2020 CLINICAL DATA:  64year old female with stroke EXAM: BILATERAL CAROTID DUPLEX ULTRASOUND TECHNIQUE: GPearline Cablesscale imaging, color Doppler and duplex ultrasound were performed of bilateral carotid and vertebral arteries in the neck. COMPARISON:  None. FINDINGS: Criteria: Quantification of carotid stenosis is based on velocity parameters that correlate the residual internal carotid diameter with NASCET-based stenosis levels, using the diameter of the distal  internal carotid lumen as the denominator for stenosis measurement. The following velocity measurements were obtained: RIGHT ICA:  Systolic 82 cm/sec, Diastolic 22 cm/sec CCA:  56 cm/sec SYSTOLIC ICA/CCA RATIO:  1.5 ECA:  80 cm/sec LEFT ICA:  Systolic 119 cm/sec, Diastolic 31 cm/sec CCA:  69 cm/sec SYSTOLIC ICA/CCA RATIO:  1.5 ECA:  67 cm/sec Right Brachial SBP: Not acquired Left Brachial SBP: Not acquired RIGHT CAROTID ARTERY: No significant calcifications of the right common carotid artery. Intermediate waveform maintained. Heterogeneous and partially calcified plaque at the right carotid bifurcation. No significant lumen shadowing. Low resistance waveform of the right ICA. No significant tortuosity. RIGHT VERTEBRAL ARTERY: Antegrade flow with low resistance waveform. LEFT CAROTID ARTERY: No significant calcifications of the left common carotid  artery. Intermediate waveform maintained. Heterogeneous and partially calcified plaque at the left carotid bifurcation without significant lumen shadowing. Low resistance waveform of the left ICA. No significant tortuosity. LEFT VERTEBRAL ARTERY:  Antegrade flow with low resistance waveform. IMPRESSION: Color duplex indicates minimal heterogeneous and calcified plaque, with no hemodynamically significant stenosis by duplex criteria in the extracranial cerebrovascular circulation. Signed, Dulcy Fanny. Dellia Nims, RPVI [facility] Electronically Signed   By: Corrie Mckusick D.O.   On: 07/19/2020 05:40   US Venous Img Lower Bilateral (DVT)  Result Date: 07/13/2020 CLINICAL DATA:  Bilateral leg edema. EXAM: BILATERAL LOWER EXTREMITY VENOUS DOPPLER ULTRASOUND BILATERAL LOWER EXTREMITY VENOUS DOPPLER ULTRASOUND TECHNIQUE: Gray-scale sonography with graded compression, as well as color Doppler and duplex ultrasound were performed to evaluate the lower extremity deep venous systems from the level of the common femoral vein and including the common femoral, femoral, profunda femoral, popliteal and calf veins including the posterior tibial, peroneal and gastrocnemius veins when visible. The superficial great saphenous vein was also interrogated. Spectral Doppler was utilized to evaluate flow at rest and with distal augmentation maneuvers in the common femoral, femoral and popliteal veins. COMPARISON:  None. FINDINGS: RIGHT LOWER EXTREMITY Common Femoral Vein: No evidence of thrombus. Normal compressibility, respiratory phasicity and response to augmentation. Saphenofemoral Junction: No evidence of thrombus. Normal compressibility and flow on color Doppler imaging. Profunda Femoral Vein: No evidence of thrombus. Normal compressibility and flow on color Doppler imaging. Femoral Vein: No evidence of thrombus. Normal compressibility, respiratory phasicity and response to  augmentation. Popliteal Vein: No evidence of thrombus. Normal compressibility, respiratory phasicity and response to augmentation. Calf Veins: No evidence of thrombus. Normal compressibility and flow on color Doppler imaging. Superficial Great Saphenous Vein: No evidence of thrombus. Normal compressibility. LEFT LOWER EXTREMITY Common Femoral Vein: No evidence of thrombus. Normal compressibility, respiratory phasicity and response to augmentation. Saphenofemoral Junction: No evidence of thrombus. Normal compressibility and flow on color Doppler imaging. Profunda Femoral Vein: No evidence of thrombus. Normal compressibility and flow on color Doppler imaging. Femoral Vein: No evidence of thrombus. Normal compressibility, respiratory phasicity and response to augmentation. Popliteal Vein: No evidence of thrombus. Normal compressibility, respiratory phasicity and response to augmentation. Calf Veins: No evidence of thrombus. Normal compressibility and flow on color Doppler imaging. Superficial Great Saphenous Vein: No evidence of thrombus. Normal compressibility. IMPRESSION: No evidence of deep venous thrombosis in either lower extremity. Electronically Signed   By: Margaretha Sheffield MD   On: 07/13/2020 14:22   US ARTERIAL LOWER EXTREMITY DUPLEX BILATERAL  Result Date: 07/15/2020 CLINICAL DATA:  64 year old female with history of critical limb ischemia. EXAM: BILATERAL LOWER EXTREMITY ARTERIAL DUPLEX SCAN TECHNIQUE: Gray-scale sonography as well as color Doppler and duplex ultrasound  was performed to evaluate the arteries of both lower extremities including the common, superficial and profunda femoral arteries, popliteal artery and calf arteries. COMPARISON:  None. FINDINGS: Right Lower Extremity ABI: Not obtained. Inflow: Normal common femoral arterial waveforms and velocities. No evidence of inflow (aortoiliac) disease. Outflow: Normal profunda femoral, superficial femoral and popliteal arterial waveforms and  velocities. No focal elevation of the PSV to suggest stenosis. Runoff: Normal posterior and anterior tibial arterial waveforms and velocities. Vessels are patent to the ankle. Left Lower Extremity ABI: Not obtained. Inflow: Normal common femoral arterial waveforms and velocities. No evidence of inflow (aortoiliac) disease. Outflow: Normal profunda femoral, superficial femoral and popliteal arterial waveforms and velocities. No focal elevation of the PSV to suggest stenosis. Runoff: Normal posterior and anterior tibial arterial waveforms and velocities. Vessels are patent to the ankle. IMPRESSION: Patent bilateral lower extremity arteries with normal waveforms and no evidence of significant atherosclerosis or flow limiting stenosis. Ruthann Cancer, MD [facility] Radiology Electronically Signed   By: Ruthann Cancer MD   On: 07/15/2020 07:42   CT CEREBRAL PERFUSION W CONTRAST  Result Date: 07/16/2020 CLINICAL DATA:  Stroke suspected.  Unable to move right side. EXAM: CT ANGIOGRAPHY HEAD AND NECK CT PERFUSION BRAIN TECHNIQUE: Multidetector CT imaging of the head and neck was performed using the standard protocol during bolus administration of intravenous contrast. Multiplanar CT image reconstructions and MIPs were obtained to evaluate the vascular anatomy. Carotid stenosis measurements (when applicable) are obtained utilizing NASCET criteria, using the distal internal carotid diameter as the denominator. Multiphase CT imaging of the brain was performed following IV bolus contrast injection. Subsequent parametric perfusion maps were calculated using RAPID software. CONTRAST:  172m OMNIPAQUE IOHEXOL 350 MG/ML SOLN COMPARISON:  CT head 07/10/2020 FINDINGS: CT HEAD FINDINGS Brain: Evaluation is limited by patient motion. There is no definite evidence of acute large vascular territory infarct. There is similar extensive patchy white matter hypoattenuation, most likely the  sequela of chronic microvascular ischemic disease. No acute hemorrhage. No abnormal mass effect or mass lesion. No hydrocephalus. Vascular: No hyperdense vessel identified. Calcific atherosclerosis. Skull: No evidence of acute fracture. Sinuses/Orbits: Limit evaluation without evidence of fracture or focal lesion. Other: None. ASPECTS ([facility] Early CT Score) Total score (0-10 with 10 being normal): 10 Review of the MIP images confirms the above findings CTA NECK FINDINGS Nondiagnostic arterial evaluation in the upper neck/skull base, including the internal carotid arteries in the neck and mid to distal vertebral arteries. Aortic arch: Calcified atherosclerosis without evidence of significant stenosis or aneurysm. Right carotid system: The visualized common carotid artery is within normal limits without significant stenosis or occlusion. Nondiagnostic evaluation of the distal common carotid artery and internal carotid artery to the level of the petrous carotid. Left carotid system: Calcified and noncalcified atherosclerosis of the common carotid artery without evidence of significant stenosis. Nondiagnostic evaluation of the distal common carotid artery and internal carotid artery to the level of the petrous carotid. Vertebral arteries: Approximately, no significant stenosis or occlusion. Nondiagnostic evaluation of the mid to distal vertebral arteries. Skeleton: Multilevel severe degenerative change. Other neck: No evidence of mass or adenopathy. Upper chest: Partially imaged fibrotic lung changes, possibly related to the patient's history of sarcoidosis. Review of the MIP images confirms the above findings CTA HEAD FINDINGS Anterior circulation: Calcific atherosclerosis of bilateral cavernous carotids without evidence of greater than 50% narrowing. Bilateral M1 MCAs and proximal M2 MCA is are patent without evidence of significant stenosis.  Distal MCA branches appear relatively symmetric. The right  A1 ACA is hypoplastic with a large left A1 ACA, likely anatomic variation. Posterior circulation: No significant stenosis or occlusion of the distal V4 vertebral arteries and basilar artery. Fetal type right PCA. No significant stenosis identified of the posterior cerebral arteries. No aneurysm. Venous sinuses: As permitted by contrast timing, patent.ww Review of the MIP images confirms the above findings CT Brain Perfusion Findings: ASPECTS: 10 CBF (<30%) Volume: 79m Perfusion (Tmax>6.0s) volume: 653mMismatch Volume: 50m48malculated T-max greater than 6 sec and mismatch is favored artifactual given location in a region of streak/motion and non vascular territory. IMPRESSION: 1. Overall limited evaluation given patient motion with nondiagnostic arterial evaluation in the upper neck/skull base, including the internal carotid arteries and mid to distal vertebral arteries in the neck. Within this limitation, no evidence of acute intracranial abnormality, large vessel occlusion or significant (greater than 50%) arterial stenosis. Repeat CTA neck could further evaluate if clinically indicated. 2. No convincing penumbra. Small area of reported mismatch is favored artifactual. 3. Partially imaged fibrotic lung changes, possibly related to the patient's reported history of sarcoidosis. Code stroke imaging results were communicated on 07/16/2020 at 11:58 am to provider Dr. ReyDoy Mincea telephone, who verbally acknowledged these results. Electronically Signed   By: FreMargaretha Sheffield   On: 07/16/2020 12:17   DG Chest Port 1 View  Result Date: 07/20/2020 CLINICAL DATA:  Acute respiratory failure. EXAM: PORTABLE CHEST 1 VIEW COMPARISON:  07/14/2020 and CT chest 07/10/2020. FINDINGS: Patient is rotated. Trachea is midline. Heart is enlarged, stable. Upper lobe predominant coarsened pulmonary parenchymal markings with relatively low lung volumes. Prominent epicardial fat versus small left pleural effusion. Thoracic vertebral  body augmentations. IMPRESSION: 1. Pulmonary parenchymal findings of sarcoid without superimposed acute findings. 2. Prominent epicardial fat versus small left pleural effusion. Electronically Signed   By: MelLorin PicketD.   On: 07/20/2020 07:47   DG Chest Port 1 View  Result Date: 07/14/2020 CLINICAL DATA:  Intubated EXAM: PORTABLE CHEST 1 VIEW COMPARISON:  07/14/2020, 07/13/2020, CT chest 07/10/2020 FINDINGS: Endotracheal tube tip is about 3 cm superior to the carina. Treated compression deformities of the spine. Bilateral pulmonary fibrosis with hilar retraction and enlargement. Stable cardiomediastinal silhouette. No pneumothorax. IMPRESSION: Endotracheal tube tip about 3 cm superior to the carina. Pulmonary fibrosis. No significant change since radiograph performed earlier today. Electronically Signed   By: KimDonavan FoilD.   On: 07/14/2020 22:06   DG Chest Port 1 View  Result Date: 07/14/2020 CLINICAL DATA:  Intubated EXAM: PORTABLE CHEST 1 VIEW COMPARISON:  07/13/2020, 07/09/2020, CT chest 07/10/2020, chest x-ray 10/26/2017 FINDINGS: Interval intubation, tip of the endotracheal tube is about 3.9 cm superior to the carina. Treated thoracic compression fractures. Bilateral pulmonary fibrosis, apical dominant compatible with history of sarcoid. Hilar retraction and enlargement. No pneumothorax or pleural effusion. IMPRESSION: 1. Endotracheal tube tip about 3.9 cm superior to the carina. 2. Chronic pulmonary fibrosis with hilar retraction and enlargement. No definite acute interval change compared to most recent prior. Electronically Signed   By: KimDonavan FoilD.   On: 07/14/2020 20:43   DG Chest Port 1 View  Result Date: 07/13/2020 CLINICAL DATA:  Shortness of breath.  History of sarcoidosis. EXAM: PORTABLE CHEST 1 VIEW COMPARISON:  Chest x-ray 07/09/2020, 01/10/201.  9 CT 07/10/2020. FINDINGS: Stable cardiomegaly. Stable bilateral hilar fullness again noted consistent with adenopathy.  Bilateral chronic interstitial prominence. Findings consistent with the patient's known sarcoidosis. Superimposed acute interstitial  process including pneumonitis cannot be excluded. Left base pleural-parenchymal thickening consistent scarring again noted. No pneumothorax. Prior thoracic vertebroplasties. IMPRESSION: Stable cardiomegaly. Bilateral hilar adenopathy and chronic interstitial prominence consistent with the patient's known sarcoidosis. Superimposed active interstitial process including pneumonitis cannot be excluded. Electronically Signed   By: Marcello Moores  Register   On: 07/13/2020 14:21   DG Chest Port 1 View  Result Date: 07/09/2020 CLINICAL DATA:  Shortness of breath EXAM: PORTABLE CHEST 1 VIEW COMPARISON:  10/26/2017 FINDINGS: Cardiac shadow is stable. Chronic scarring is noted in the apices bilaterally with hilar retraction consistent with the given clinical history of prior sarcoidosis. Patchy increased airspace opacities are noted in the lower lungs which may represent some acute on chronic infiltrate. Changes of prior vertebral augmentation are seen. No acute bony abnormality is noted. IMPRESSION: Changes of scarring consistent with prior sarcoidosis. Mild patchy airspace opacities are noted in the bases increased from the prior exam. This may represent further fibrotic change although the possibility of acute on chronic infiltrate deserves consideration. Electronically Signed   By: Inez Catalina M.D.   On: 07/09/2020 23:34   DG OR UROLOGY CYSTO IMAGE ([facility] ONLY)  Result Date: 07/14/2020 There is no interpretation for this exam.  This order is for images obtained during a surgical procedure.  Please See "Surgeries" Tab for more information regarding the procedure.   ECHOCARDIOGRAM COMPLETE  Result Date: 07/13/2020    ECHOCARDIOGRAM REPORT   Patient Name:   Stacie [name] Date of Exam: 07/13/2020 Medical Rec #:  [id number]         Height:       67.0 in Accession #:    [id number]         Weight:       194.9 lb Date of Birth:  Aug 04, 1956          BSA:          2.000 m Patient Age:    78 years          BP:           121/75 mmHg Patient Gender: F                 HR:           94 bpm. Exam Location:  [facility] Procedure: 2D Echo, Cardiac Doppler and Color Doppler Indications:     Abnormal ECG 794.31  History:         Patient has no prior history of Echocardiogram examinations.                  Pulmonary HTN; Signs/Symptoms:Murmur. Sarcoidosis.  Sonographer:     Sherrie Sport RDCS (AE) Referring Phys:  [id number] Ottie Glazier Diagnosing Phys: Serafina Royals MD IMPRESSIONS  1. Left ventricular ejection fraction, by estimation, is 30 to 35%. The left ventricle has moderately decreased function. The left ventricle demonstrates global hypokinesis. The left ventricular internal cavity size was mildly dilated. Left ventricular diastolic parameters were normal.  2. Right ventricular systolic function is mildly reduced. The right ventricular size is moderately enlarged. There is moderately elevated pulmonary artery systolic pressure.  3. Left atrial size was moderately dilated.  4. Right atrial size was moderately dilated.  5. The mitral valve is normal in structure. Mild to moderate mitral valve regurgitation.  6. Tricuspid valve regurgitation is moderate.  7. The aortic valve is normal in structure. Aortic valve regurgitation is trivial. FINDINGS  Left Ventricle: Left ventricular ejection fraction, by estimation, is 30 to  35%. The left ventricle has moderately decreased function. The left ventricle demonstrates global hypokinesis. The left ventricular internal cavity size was mildly dilated. There is no left ventricular hypertrophy. Left ventricular diastolic parameters were normal. Right Ventricle: The right ventricular size is moderately enlarged. No increase in right ventricular wall thickness. Right ventricular systolic function is mildly reduced. There is moderately elevated pulmonary artery systolic pressure. Left  Atrium: Left atrial size was moderately dilated. Right Atrium: Right atrial size was moderately dilated. Pericardium: There is no evidence of pericardial effusion. Mitral Valve: The mitral valve is normal in structure. Mild to moderate mitral valve regurgitation. Tricuspid Valve: The tricuspid valve is normal in structure. Tricuspid valve regurgitation is moderate. Aortic Valve: The aortic valve is normal in structure. Aortic valve regurgitation is trivial. Aortic valve mean gradient measures 3.0 mmHg. Aortic valve peak gradient measures 5.3 mmHg. Aortic valve area, by VTI measures 2.00 cm. Pulmonic Valve: The pulmonic valve was normal in structure. Pulmonic valve regurgitation is trivial. Aorta: The aortic root and ascending aorta are structurally normal, with no evidence of dilitation. IAS/Shunts: No atrial level shunt detected by color flow Doppler.  LEFT VENTRICLE PLAX 2D LVIDd:         4.86 cm     Diastology LVIDs:         3.98 cm     LV e' medial:    3.05 cm/s LV PW:         0.89 cm     LV E/e' medial:  23.5 LV IVS:        0.71 cm     LV e' lateral:   3.59 cm/s LVOT diam:     2.00 cm     LV E/e' lateral: 19.9 LV SV:         38 LV SV Index:   19 LVOT Area:     3.14 cm  LV Volumes (MOD) LV vol d, MOD A2C: 85.2 ml LV vol d, MOD A4C: 52.0 ml LV vol s, MOD A2C: 52.2 ml LV vol s, MOD A4C: 33.0 ml LV SV MOD A2C:     33.0 ml LV SV MOD A4C:     52.0 ml LV SV MOD BP:      30.6 ml RIGHT VENTRICLE RV Basal diam:  4.28 cm LEFT ATRIUM             Index       RIGHT ATRIUM           Index LA diam:        3.70 cm 1.85 cm/m  RA Area:     18.60 cm LA Vol (A2C):   52.6 ml 26.30 ml/m RA Volume:   52.20 ml  26.10 ml/m LA Vol (A4C):   24.7 ml 12.35 ml/m LA Biplane Vol: 38.6 ml 19.30 ml/m  AORTIC VALVE                   PULMONIC VALVE AV Area (Vmax):    2.00 cm    PV Vmax:        0.50 m/s AV Area (Vmean):   1.93 cm    PV Peak grad:   1.0 mmHg AV Area (VTI):     2.00 cm    RVOT Peak grad: 2 mmHg AV Vmax:           115.00  cm/s AV Vmean:          85.100 cm/s AV VTI:  0.192 m AV Peak Grad:      5.3 mmHg AV Mean Grad:      3.0 mmHg LVOT Vmax:         73.10 cm/s LVOT Vmean:        52.400 cm/s LVOT VTI:          0.122 m LVOT/AV VTI ratio: 0.64  AORTA Ao Root diam: 3.20 cm MITRAL VALVE               TRICUSPID VALVE MV Area (PHT): 3.97 cm    TR Peak grad:   40.7 mmHg MV Decel Time: 191 msec    TR Vmax:        319.00 cm/s MV E velocity: 71.60 cm/s MV A velocity: 84.40 cm/s  SHUNTS MV E/A ratio:  0.85        Systemic VTI:  0.12 m                            Systemic Diam: 2.00 cm Serafina Royals MD Electronically signed by Serafina Royals MD Signature Date/Time: 07/13/2020/1:02:19 PM    Final    ECHOCARDIOGRAM COMPLETE BUBBLE STUDY  Result Date: 07/17/2020    ECHOCARDIOGRAM REPORT   Patient Name:   Stacie [name] Date of Exam: 07/16/2020 Medical Rec #:  [id number]         Height:       67.0 in Accession #:    [id number]        Weight:       192.9 lb Date of Birth:  10-Nov-1955          BSA:          1.991 m Patient Age:    68 years          BP:           120/72 mmHg Patient Gender: F                 HR:           99 bpm. Exam Location:  [facility] Procedure: 2D Echo, Cardiac Doppler, Color Doppler and Saline Contrast Bubble            Study Indications:     Stroke 434.91  History:         Patient has prior history of Echocardiogram examinations, most                  recent 07/13/2020. Pulmonary HTN, Signs/Symptoms:Murmur; Risk                  Factors:Sleep Apnea.  Sonographer:     Sherrie Sport RDCS (AE) Referring Phys:  [place] Diagnosing Phys: Yolonda Kida MD IMPRESSIONS  1. No evidence of thrombus.  2. Left ventricular ejection fraction, by estimation, is 35 to 40%. The left ventricle has mild to moderately decreased function. The left ventricle demonstrates global hypokinesis. Left ventricular diastolic parameters are consistent with Grade III diastolic dysfunction (restrictive).  3. Right ventricular systolic function  is normal. The right ventricular size is normal.  4. The mitral valve is normal in structure. No evidence of mitral valve regurgitation.  5. The aortic valve is grossly normal. Aortic valve regurgitation is not visualized. Conclusion(s)/Recommendation(s): No evidence of valvular vegetations on this transthoracic echocardiogram. Would recommend a transesophageal echocardiogram to exclude infective endocarditis if clinically indicated. Unable to exclude left ventricular thrombus, would recommend a repeat transthoracic echocardiogram with contrast.  FINDINGS  Left Ventricle: Left ventricular ejection fraction, by estimation, is 35 to 40%. The left ventricle has mild to moderately decreased function. The left ventricle demonstrates global hypokinesis. The left ventricular internal cavity size was normal in size. There is no left ventricular hypertrophy. Left ventricular diastolic parameters are consistent with Grade III diastolic dysfunction (restrictive). Right Ventricle: The right ventricular size is normal. No increase in right ventricular wall thickness. Right ventricular systolic function is normal. Left Atrium: Left atrial size was normal in size. Right Atrium: Right atrial size was normal in size. Pericardium: There is no evidence of pericardial effusion. Mitral Valve: The mitral valve is normal in structure. No evidence of mitral valve regurgitation. Tricuspid Valve: The tricuspid valve is normal in structure. Tricuspid valve regurgitation is not demonstrated. Aortic Valve: The aortic valve is grossly normal. Aortic valve regurgitation is not visualized. Aortic valve mean gradient measures 6.0 mmHg. Aortic valve peak gradient measures 10.6 mmHg. Aortic valve area, by VTI measures 1.72 cm. Pulmonic Valve: The pulmonic valve was normal in structure. Pulmonic valve regurgitation is not visualized. Aorta: The ascending aorta was not well visualized. IAS/Shunts: No atrial level shunt detected by color flow Doppler.  Agitated saline contrast was given intravenously to evaluate for intracardiac shunting. Additional Comments: No evidence of thrombus.  LEFT VENTRICLE PLAX 2D LVIDd:         4.36 cm     Diastology LVIDs:         3.64 cm     LV e' medial:    4.90 cm/s LV PW:         1.30 cm     LV E/e' medial:  12.6 LV IVS:        1.31 cm     LV e' lateral:   3.92 cm/s LVOT diam:     2.00 cm     LV E/e' lateral: 15.7 LV SV:         52 LV SV Index:   26 LVOT Area:     3.14 cm  LV Volumes (MOD) LV vol d, MOD A2C: 73.5 ml LV vol d, MOD A4C: 73.9 ml LV vol s, MOD A2C: 43.2 ml LV vol s, MOD A4C: 47.6 ml LV SV MOD A2C:     30.3 ml LV SV MOD A4C:     73.9 ml LV SV MOD BP:      27.4 ml RIGHT VENTRICLE RV Basal diam:  2.71 cm RV S prime:     13.60 cm/s TAPSE (M-mode): 3.1 cm LEFT ATRIUM             Index       RIGHT ATRIUM           Index LA diam:        3.40 cm 1.71 cm/m  RA Area:     12.80 cm LA Vol (A2C):   34.7 ml 17.42 ml/m RA Volume:   27.80 ml  13.96 ml/m LA Vol (A4C):   28.7 ml 14.41 ml/m LA Biplane Vol: 34.0 ml 17.07 ml/m  AORTIC VALVE AV Area (Vmax):    1.99 cm AV Area (Vmean):   1.89 cm AV Area (VTI):     1.72 cm AV Vmax:           163.00 cm/s AV Vmean:          116.500 cm/s AV VTI:            0.301 m AV Peak Grad:  10.6 mmHg AV Mean Grad:      6.0 mmHg LVOT Vmax:         103.00 cm/s LVOT Vmean:        70.100 cm/s LVOT VTI:          0.165 m LVOT/AV VTI ratio: 0.55  AORTA Ao Root diam: 2.80 cm MITRAL VALVE                TRICUSPID VALVE MV Area (PHT): 4.80 cm     TR Peak grad:   23.6 mmHg MV Decel Time: 158 msec     TR Vmax:        243.00 cm/s MV E velocity: 61.60 cm/s MV A velocity: 128.00 cm/s  SHUNTS MV E/A ratio:  0.48         Systemic VTI:  0.16 m                             Systemic Diam: 2.00 cm Yolonda Kida MD Electronically signed by Yolonda Kida MD Signature Date/Time: 07/17/2020/3:51:35 PM    Final    CT HEAD CODE STROKE WO CONTRAST  Result Date: 07/16/2020 CLINICAL DATA:  Stroke suspected.   Unable to move right side. EXAM: CT ANGIOGRAPHY HEAD AND NECK CT PERFUSION BRAIN TECHNIQUE: Multidetector CT imaging of the head and neck was performed using the standard protocol during bolus administration of intravenous contrast. Multiplanar CT image reconstructions and MIPs were obtained to evaluate the vascular anatomy. Carotid stenosis measurements (when applicable) are obtained utilizing NASCET criteria, using the distal internal carotid diameter as the denominator. Multiphase CT imaging of the brain was performed following IV bolus contrast injection. Subsequent parametric perfusion maps were calculated using RAPID software. CONTRAST:  189m OMNIPAQUE IOHEXOL 350 MG/ML SOLN COMPARISON:  CT head 07/10/2020 FINDINGS: CT HEAD FINDINGS Brain: Evaluation is limited by patient motion. There is no definite evidence of acute large vascular territory infarct. There is similar extensive patchy white matter hypoattenuation, most likely the sequela of chronic microvascular ischemic disease. No acute hemorrhage. No abnormal mass effect or mass lesion. No hydrocephalus. Vascular: No hyperdense vessel identified. Calcific atherosclerosis. Skull: No evidence of acute fracture. Sinuses/Orbits: Limit evaluation without evidence of fracture or focal lesion. Other: None. ASPECTS ([facility]) Total score (0-10 with 10 being normal): 10 Review of the MIP images confirms the above findings CTA NECK FINDINGS Nondiagnostic arterial evaluation in the upper neck/skull base, including the internal carotid arteries in the neck and mid to distal vertebral arteries. Aortic arch: Calcified atherosclerosis without evidence of significant stenosis or aneurysm. Right carotid system: The visualized common carotid artery is within normal limits without significant stenosis or occlusion. Nondiagnostic evaluation of the distal common carotid artery and internal carotid artery to the level of the petrous carotid. Left  carotid system: Calcified and noncalcified atherosclerosis of the common carotid artery without evidence of significant stenosis. Nondiagnostic evaluation of the distal common carotid artery and internal carotid artery to the level of the petrous carotid. Vertebral arteries: Approximately, no significant stenosis or occlusion. Nondiagnostic evaluation of the mid to distal vertebral arteries. Skeleton: Multilevel severe degenerative change. Other neck: No evidence of mass or adenopathy. Upper chest: Partially imaged fibrotic lung changes, possibly related to the patient's history of sarcoidosis. Review of the MIP images confirms the above findings CTA HEAD FINDINGS Anterior circulation: Calcific atherosclerosis of bilateral cavernous carotids without evidence of greater than 50% narrowing. Bilateral M1  MCAs and proximal M2 MCA is are patent without evidence of significant stenosis. Distal MCA branches appear relatively symmetric. The right A1 ACA is hypoplastic with a large left A1 ACA, likely anatomic variation. Posterior circulation: No significant stenosis or occlusion of the distal V4 vertebral arteries and basilar artery. Fetal type right PCA. No significant stenosis identified of the posterior cerebral arteries. No aneurysm. Venous sinuses: As permitted by contrast timing, [email or web address] Review of the MIP images confirms the above findings CT Brain Perfusion Findings: ASPECTS: 10 CBF (<30%) Volume: 30m Perfusion (Tmax>6.0s) volume: 615mMismatch Volume: 84m71malculated T-max greater than 6 sec and mismatch is favored artifactual given location in a region of streak/motion and non vascular territory. IMPRESSION: 1. Overall limited evaluation given patient motion with nondiagnostic arterial evaluation in the upper neck/skull base, including the internal carotid arteries and mid to distal vertebral arteries in the neck. Within this limitation, no evidence of acute intracranial abnormality, large vessel occlusion or  significant (greater than 50%) arterial stenosis. Repeat CTA neck could further evaluate if clinically indicated. 2. No convincing penumbra. Small area of reported mismatch is favored artifactual. 3. Partially imaged fibrotic lung changes, possibly related to the patient's reported history of sarcoidosis. Code stroke imaging results were communicated on 07/16/2020 at 11:58 am to provider Dr. ReyDoy Mincea telephone, who verbally acknowledged these results. Electronically Signed   By: FreMargaretha Sheffield   On: 07/16/2020 12:17   [place] [employer] RUQ  Result Date: 07/10/2020 CLINICAL DATA:  Elevated liver enzymes EXAM: ULTRASOUND ABDOMEN LIMITED RIGHT UPPER QUADRANT COMPARISON:  CT abdomen and pelvis February 11, 2020 FINDINGS: Gallbladder: No gallstones or wall thickening visualized. There is no pericholecystic fluid. No sonographic Murphy sign noted by sonographer. Common bile duct: Diameter: 5 mm. No intrahepatic or extrahepatic biliary duct dilatation. Liver: No focal lesion identified. Liver echogenicity overall is increased and mildly inhomogeneous. Portal vein is patent on color Doppler imaging with normal direction of blood flow towards the liver. Other: None. IMPRESSION: Liver echogenicity is overall increased and mildly inhomogeneous. This appearance most likely is indicative of hepatic steatosis, potentially with a degree of underlying parenchymal liver disease. No focal liver lesions evident. Note that the sensitivity of ultrasound for detection of focal liver lesions is somewhat diminished in this circumstance. Study otherwise unremarkable. Electronically Signed   By: WilLowella GripI M.D.   On: 07/10/2020 11:08    Wt Readings from Last 3 Encounters:  07/23/20 85.1 kg  06/12/20 87.5 kg  05/12/20 86.2 kg    EKG: Sinus rhythm  Physical Exam:  Blood pressure (!) 127/51, pulse 95, temperature 97.7 F (36.5 C), temperature source Oral, resp. rate 20, height 5' 7.01" (1.702 m), weight 85.1  kg, SpO2 98 %. Body mass index is 29.38 kg/m. General: Chronically ill-appearing female in no acute distress Head: Normocephalic, atraumatic, sclera non-icteric, no xanthomas, nares are without discharge.  Neck: Negative for carotid bruits. JVD not elevated. Lungs: Clear bilaterally to auscultation without wheezes, rales, or rhonchi. Breathing is unlabored. Heart: RRR with S1 S2. No murmurs, rubs, or gallops appreciated. Abdomen: Soft, non-tender, non-distended with normoactive bowel sounds. No hepatomegaly. No rebound/guarding. No obvious abdominal masses. Msk:  Strength and tone appear normal for age. Extremities: No clubbing or cyanosis. No edema.  Distal pedal pulses are 2+ and equal bilaterally. Neuro: Alert and oriented X 3. No facial asymmetry. No focal deficit. Moves all extremities spontaneously. Psych:  Responds to questions appropriately with a normal affect.     Assessment and  Plan  Patient with history of pulmonary sarcoid who has had recent problems with bacteremia and embolic stroke.  Requested to do transesophageal echo to evaluate for endocarditis and/or cardiac source of emboli.  Discussed risk and benefits with patient and her daughter yesterday.  Plan to schedule this on Monday.  Will need to be n.p.o. after midnight Sunday night.  Signed, Teodoro Spray MD 07/23/2020, 8:40 AM Pager: [phone]

## 2020-07-23 NOTE — Progress Notes (Signed)
Physical Therapy Treatment Patient Details Name: Stacie [name] MRN: [id number] DOB: 12-Feb-1956 Today's Date: 07/23/2020    History of Present Illness Stacie [name] Shows is a 64 y.o. Female with a past medical history significant for pulmonary sarcoidosis (on 2L [place] at baseline), pulmonary hypertension, OSA, asthma, hypertension, hypothyroidism, IBS, and chronic kidney disease who presents to [facility] ED on 07/09/20 due to altered mental status and shortness of breath.  Admitted for management of acute hypoxic respiratory failure and sepsis related to CAP, UTI.  Hospital course significant for periods of delirium requiring precedex infusion; questionable DIC with ischemic changes to L foot, R great toe; cystoscopy with pyelogram (9/28); extubation (9/29); and acute code stroke (9/30) due to new-onset slurred speech, R hemiparesis (NIHSS 16).    PT Comments    Pt received in bed with daughter at bedside. Pt on 2L O2 via nasal cannula with purewick in place. Pt performed supine to sit from elevated HOB and required mod A for truncal elevation and BLE management to and over edge of bed. In sitting, pt tends to sit with forward rounded shoulders and cervical flexion. Verbal/tactile cues for improved upright sitting. Pt at 98% SpO2. Once sitting edge of bed, pt reported needing to have a BM and requested to get on bedpan. Pt encouraged to use BSC since already upright. Pt assisted to [facility] with mod+2 A for sit <> stand transfer. Daughter moved bed once pt in standing and placed BSC. Verbal cues to power through BUE on armrests and through BLE to come into standing.  Pt required standing balance of mod-max+2 A for pericare as pt had BM. Pt with slumped posture once upright requiring cues to extend hips and trunk. Pt able to transfer back to bed via same method of swapping BSC for bed with mod+2 A without AD. A third attempt was made to stand and pt unable to clear hips from bed despite max+2 A. Max+2 A for truncal and  BLE management to return to supine. Unable to get final SpO2 reading secondary to cold hands. Pt performed therex in bed for promotion of improved motor control and muscle strengthening. Pt with decreased processing of single step commands and required multimodal cues for all activities. Pt continues to make progress requiring decreased assistance today for sit <> stand transfers however continues to be limited by fatigue but is able to tolerate increasing time of sessions. Continue to recommend CIR at this time to maximize safety and independence with mobility, return to PLOF, and decreased caregiver burden.   Follow Up Recommendations  CIR     Equipment Recommendations       Recommendations for Other Services       Precautions / Restrictions Precautions Precautions: Fall Restrictions Weight Bearing Restrictions: No    Mobility  Bed Mobility Overal bed mobility: Needs Assistance Bed Mobility: Supine to Sit;Sit to Supine;Rolling Rolling: Min guard   Supine to sit: Mod assist;HOB elevated Sit to supine: Max assist;+2 for physical assistance   General bed mobility comments: Mod A for trunk elevation and BLE management to and over edge of bed with increased verbal/tactile cues; max+2 A for sit to supine for trunk and BLE assist  Transfers Overall transfer level: Needs assistance Equipment used: None Transfers: Sit to/from Stand Sit to Stand: Mod assist;+2 physical assistance;From elevated surface         General transfer comment: Mod+2 A for sit <> stand transfers x 2 for boosting to stand and eccentric control on descent x 2 trials,  3rd attempt made however pt unable to perform secondary to fatigue with max+2 A  Ambulation/Gait             General Gait Details: not safe to attempt at this time   Stairs             Wheelchair Mobility    Modified Rankin (Stroke Patients Only)       Balance Overall balance assessment: Needs assistance Sitting-balance  support: Bilateral upper extremity supported;Feet supported Sitting balance-Leahy Scale: Fair Sitting balance - Comments: SBA to CGA for static sitting balance   Standing balance support: Bilateral upper extremity supported Standing balance-Leahy Scale: Poor Standing balance comment: +2 assistance for static standing balance                            Cognition Arousal/Alertness: Awake/alert Behavior During Therapy: WFL for tasks assessed/performed Overall Cognitive Status: Impaired/Different from baseline Area of Impairment: Memory;Following commands;Safety/judgement;Awareness;Problem solving                       Following Commands: Follows one step commands inconsistently;Follows one step commands with increased time Safety/Judgement: Decreased awareness of deficits;Decreased awareness of safety   Problem Solving: Slow processing;Decreased initiation;Difficulty sequencing;Requires verbal cues;Requires tactile cues General Comments: Multimodal cues for all tasks and reuqires increased tome for processing single step commands.      Exercises Other Exercises Other Exercises: ankle pumps, hi ab/add, and SAQ x 5 bilaterally; verbal cues for improved eccentric control; scap retractions x 10 with verbal/tactile cues    General Comments General comments (skin integrity, edema, etc.): noted redness/rash around peri area; daughter applied powder      Pertinent Vitals/Pain Pain Assessment: Faces Faces Pain Scale: Hurts little more Pain Location: R distal anterior thigh Pain Descriptors / Indicators: Cramping Pain Intervention(s): Monitored during session;Repositioned    Home Living                      Prior Function            PT Goals (current goals can now be found in the care plan section) Acute Rehab PT Goals PT Goal Formulation: With patient Time For Goal Achievement: 08/01/20 Potential to Achieve Goals: Fair Progress towards PT goals:  Progressing toward goals    Frequency    Min 2X/week      PT Plan Current plan remains appropriate    Co-evaluation              AM-PAC PT "6 Clicks" Mobility   Outcome Measure  Help needed turning from your back to your side while in a flat bed without using bedrails?: A Little Help needed moving from lying on your back to sitting on the side of a flat bed without using bedrails?: A Lot Help needed moving to and from a bed to a chair (including a wheelchair)?: A Lot Help needed standing up from a chair using your arms (e.g., wheelchair or bedside chair)?: A Lot Help needed to walk in hospital room?: Total Help needed climbing 3-5 steps with a railing? : Total 6 Click Score: 11    End of Session Equipment Utilized During Treatment: Gait belt;Oxygen Activity Tolerance: Patient tolerated treatment well;Patient limited by fatigue Patient left: in bed;with call bell/phone within reach;with bed alarm set;with family/visitor present Nurse Communication: Mobility status PT Visit Diagnosis: Muscle weakness (generalized) (M62.81);Hemiplegia and hemiparesis Hemiplegia - Right/Left: Right Hemiplegia - dominant/non-dominant: Dominant  Hemiplegia - caused by: Cerebral infarction     Time: 1000-1042 PT Time Calculation (min) (ACUTE ONLY): 42 min  Charges:                        Frederich Chick, SPT   Morley Gaumer 07/23/2020, 11:11 AM

## 2020-07-23 NOTE — Progress Notes (Signed)
IP rehab admissions - Awaiting call back from insurance case manager regarding possible CIR admission.  I will have my partner follow up once we hear back from insurance.  Call me for questions.  [phone]

## 2020-07-23 NOTE — Progress Notes (Signed)
[facility] [place] NAME: Stacie [name]    MR#:  [id number]  DATE OF BIRTH:  1956/06/07  SUBJECTIVE:  spoke with daughter in the room. Patient earlier worked with physical therapy. Did little better than yesterday per therapist. She is resting. Did eat a few bites of eggs and some ensure this morning. Family to keep log of PO intake to get better idea of it. No respiratory distress.  REVIEW OF SYSTEMS:   Review of Systems  Constitutional: Positive for malaise/fatigue. Negative for chills, fever and weight loss.  HENT: Negative for ear discharge, ear pain and nosebleeds.   Eyes: Negative for blurred vision, pain and discharge.  Respiratory: Positive for wheezing. Negative for sputum production, shortness of breath and stridor.   Cardiovascular: Negative for chest pain, palpitations, orthopnea and PND.  Gastrointestinal: Negative for abdominal pain, diarrhea, nausea and vomiting.  Genitourinary: Negative for frequency and urgency.  Musculoskeletal: Negative for back pain and joint pain.  Neurological: Positive for weakness. Negative for sensory change, speech change and focal weakness.  Psychiatric/Behavioral: Negative for depression and hallucinations. The patient is not nervous/anxious.    Tolerating Diet:improving slowly Tolerating PT: recommending CIR  DRUG ALLERGIES:   Allergies  Allergen Reactions  . Nitrofurantoin Nausea Only  . Tramadol     Nausea and vomiting  . Sulfa Antibiotics Rash    As an infant    VITALS:  Blood pressure (!) 131/50, pulse 91, temperature (!) 97 F (36.1 C), temperature source Axillary, resp. rate 20, height 5' 7.01" (1.702 m), weight 85.1 kg, SpO2 100 %.  PHYSICAL EXAMINATION:   Physical Exam  GENERAL:  64 y.o.-year-old patient lying in the bed with no acute distress.  HEENT: Head atraumatic, normocephalic. Oropharynx and nasopharynx clear.  NECK:  Supple, no jugular venous distention. No  thyroid enlargement, no tenderness.  LUNGS: distant breath sounds bilaterally, no wheezing, rales, rhonchi. No use of accessory muscles of respiration.  CARDIOVASCULAR: S1, S2 normal. No murmurs, rubs, or gallops.  ABDOMEN: Soft, nontender, nondistended. Bowel sounds present. No organomegaly or mass.  EXTREMITIES:    Cool to touch bilateral feet. Feeble pedal pulses.necrosis left 2/3 and 4th--stable NEUROLOGIC: cranial nerves grossly intact. No focal weakness. Generalized weakness present.  PSYCHIATRIC:  patient is alert and oriented x 2.  SKIN: as above  LABORATORY PANEL:  CBC Recent Labs  Lab 07/23/20 0413  WBC 19.1*  HGB 12.9  HCT 40.4  PLT 236    Chemistries  Recent Labs  Lab 07/21/20 0617 07/22/20 0304 07/23/20 0413  NA 141   < > 143  K 4.2   < > 3.7  CL 101   < > 99  CO2 29   < > 32  GLUCOSE 144*   < > 108*  BUN 41*   < > 38*  CREATININE 1.18*   < > 1.20*  CALCIUM 8.0*   < > 8.8*  MG 1.8   < > 2.1  AST 98*  --   --   ALT 92*  --   --   ALKPHOS 71  --   --   BILITOT 0.8  --   --    < > = values in this interval not displayed.   Cardiac Enzymes No results for input(s): TROPONINI in the last 168 hours. RADIOLOGY:  No results found. ASSESSMENT AND PLAN:  Stacie [name] is a 64 y.o. Female with a past medical history significant for pulmonary sarcoidosis (on 2L  [place] at baseline), pulmonary hypertension, OSA, asthma, hypertension, hypothyroidism, IBS, and chronic kidney disease who presents to [facility] ED on 07/09/20 due to altered mental status and shortness of breath.    Severe sepsis secondary to Klebsiella pneumonia bacteremia/UTI recurrent -repeat cultures negative -patient to be continued on Rocephin till 07/26/20--may need change depending on TEE results -appreciate ID input -monitor fever and WBC  Right kidney obstructive calculi and non- obstructing calculi of the left ureter -status post surgical decompression with stent placement on 07/14/20 -patient  will follow-up with urology as outpatient for ureteral scopic, laser lithotripsy and stent exchange -Foley now removed patient urinating well -oxybutinin for bladder spasms  Acute kidney injury in the setting of severe sepsis -patient had come in with creatinine of 4.5---1.18 -urine output adequate -appreciate nephrology input  Bilateral lower extremity ischemic foot-? Embolic -necrotic left second third toes with modeling discoloration of bilateral feet -HID panel negative -?dic related -vascular consultation with Dr. Lucky Cowboy-- recommends angiogram-- on hold per family request till patient more stable  Acute on chronic hypoxic respiratory failure secondary to acute exacerbation of pulmonary sarcoidosis/acute lung injury -she initially was placed on BiPAP then intubated and now extubated transferred out of ICU 10 521 -currently on 2 L nasal cannula oxygen. Patient uses chronically at home. -Continue Solu-Medrol 20 mg Q day--will d/w pulmonary to see how long it needs to be continued -- continue LABA/ICS: Arformoterol & Budesonide - Continue Treprostinil as tolerated - Continue home CPAP QHS -pt is on Methotrexate 25 mg qd at home --currently on hold  Acute metabolic encephalopathy-- improved -suspected due to gram-negative/Klebsiella severe sepsis source urine/Embolic CVA -continue supportive care  Acute CVA-- embolic in the setting of severe sepsis  -10/4--MRI brain showed acute subacute infarcts and bilateral frontal lobes likely embolic -TEE considered-- husband wants to hold since patient is improving -continue PT OT -continue aspirin, atorvastatin -neurology recommend subcu heparin TID. No systemic anticoagulation at this time. -TEE for Monday oct 11th by Dr Ubaldo Glassing   Family communication : daughter in the room. Consults : ID, pulmonary, vascular, podiatry CODE STATUS: full DVT Prophylaxis : heparin  Status is: Inpatient  Remains inpatient appropriate because:Inpatient level  of care appropriate due to severity of illness   Dispo: The patient is from: Home              Anticipated d/c is to: CIR              Anticipated d/c date is: > 3 days              Patient currently is not medically stable to d/c.  Marland Kitchen She is working with physical therapy. She is continued on IV antibiotics. Appears to be malnourished and poor PO intake with multiple medical issues. TEE planned for monday      TOTAL TIME TAKING CARE OF THIS PATIENT: 25 minutes.  >50% time spent on counselling and coordination of care  Note: This dictation was prepared with Dragon dictation along with smaller phrase technology. Any transcriptional errors that result from this process are unintentional.  Fritzi Mandes M.D    [facility]   CC: Primary care physician; Glean Hess, MDPatient ID: Stacie [name], female   DOB: Mar 09, 1956, 64 y.o.   MRN: [id number]

## 2020-07-23 NOTE — Progress Notes (Signed)
Date of Admission:  07/09/2020    Ceftriaxone - 9/23  Friend at bedside- patient gave permission for me to discuss medical issues in front of her friend  ID: Stacie [name] is a 64 y.o. female  Active Problems:   Sepsis ([place])   Klebsiella pneumoniae sepsis ([place])   Critical lower limb ischemia ([place])   Acute renal failure ([place])   Cerebrovascular accident (CVA) due to embolism of cerebral artery ([place])    Subjective: Pt says she has lower abdominal apin No constipation Hoarse voice   Medications:  . arformoterol  15 mcg Nebulization BID  . aspirin EC  81 mg Oral Daily  . atorvastatin  40 mg Oral Daily  . budesonide (PULMICORT) nebulizer solution  0.25 mg Nebulization BID  . Chlorhexidine Gluconate Cloth  6 each Topical Q0600  . docusate  100 mg Oral BID  . dorzolamide  1 drop Both Eyes BID  . escitalopram  20 mg Oral Daily  . feeding supplement (ENSURE ENLIVE)  237 mL Oral TID BM  . heparin injection (subcutaneous)  5,000 Units Subcutaneous Q8H  . insulin aspart  0-15 Units Subcutaneous TID AC & HS  . latanoprost  1 drop Right Eye QHS  . levothyroxine  137 mcg Oral Q0600  . methylPREDNISolone (SOLU-MEDROL) injection  20 mg Intravenous Daily  . multivitamin with minerals  1 tablet Oral Daily  . nystatin  5 mL Oral QID  . nystatin  1 application Topical TID  . oxybutynin  5 mg Oral QHS  . pantoprazole  40 mg Oral Daily  . polyethylene glycol  17 g Oral Daily  . thiamine injection  100 mg Intravenous Daily  . Treprostinil  18 mcg Inhalation QID    Objective: Vital signs in last 24 hours: Temp:  [97 F (36.1 C)-97.9 F (36.6 C)] 97 F (36.1 C) (10/07 1136) Pulse Rate:  [82-101] 91 (10/07 1136) Resp:  [16-20] 20 (10/07 1136) BP: (127-164)/(49-84) 131/50 (10/07 1136) SpO2:  [96 %-100 %] 100 % (10/07 1136) Weight:  [85.1 kg] 85.1 kg (10/07 0500)  PHYSICAL EXAM:  General: Alert, cooperative, no distress, appears stated age.  Head: Normocephalic, without obvious  abnormality, atraumatic. Eyes: Conjunctivae clear, anicteric sclerae. Pupils are equal ENT Nares normal. No drainage or sinus tenderness. Oropharyngeal candidasis Neck: Supple, Back: No CVA tenderness. Lungs: b/l air entry Rhonchi  Heart: s1s2 Abdomen: Soft, ,not distended. Tenderness lower abdomen Extremities: left foot Dry gangrene Rt great toe and tip of 2.3 dry gangrene        Skin: No rashes or lesions. Or bruising Lymph: Cervical, supraclavicular normal. Neurologic: Grossly non-focal  Lab Results Recent Labs    07/22/20 0304 07/23/20 0413  WBC 21.6* 19.1*  HGB 12.8 12.9  HCT 40.9 40.4  NA 143 143  K 4.5 3.7  CL 102 99  CO2 28 32  BUN 40* 38*  CREATININE 1.18* 1.20*   Liver Panel Recent Labs    07/21/20 0617  PROT 5.9*  ALBUMIN 2.8*  AST 98*  ALT 92*  ALKPHOS 71  BILITOT 0.8  BILIDIR 0.2  IBILI 0.6     Assessment/Plan:  Septic shock secondary to klebsiella bacteremia - resolved Klebsiella bacteremia due to complicated UTI On ceftriaxone  Continue until TEE is done and then decide on furtherr antibiotic  CVA- b/l frontal lobe acute/subacute infarct likely embolic Minimal residual deficit With question of emboli, b/l feet gangrene TEE is recommended to r/o mural thrombus/vegetation Discussed with neurologist  Cardiologist seen the  patient - plan is for TEE on monday  Complicated urinary tract infection with b/l ureteric stone and rt causing obstruction s/p b/l stent placement on 07/15/20  Oropharyngeal candidiasis. Minimal response to nystatin-  Change to Fluconazole  Dry gangrene feet. Left > rt No intervention currently- vascular  Following  Leucocytosis- likely related to gangrene Discussed  the management with patient.

## 2020-07-23 NOTE — H&P (Signed)
Physical Medicine and Rehabilitation Admission H&P    Chief Complaint  Patient presents with  . Shortness of Breath  . Altered Mental Status  : HPI: Stacie [name] is a 64 year old right-handed female with history of arrhythmia, hypertension, asthma, hypothyroidism hard of hearing with hearing aids, pulmonary sarcoidosis on 2 L nasal cannula at baseline  maintained methotrexate as well as chronic prednisone, OSA, IBS, chronic kidney disease with creatinine 1.04-1.11.  Per chart review patient lives with spouse who is a Surveyor, quantity.  Two-level home bed and bath upstairs.  Independent with assistive device.  Presented 07/09/2020 with altered mental status and shortness of breath.  Cranial CT scan showed chronic white matter ischemic changes without acute abnormality.  CT of the chest showed chronic changes of pulmonary sarcoidosis with biapical predominant pulmonary fibrosis.  Very mild superimposed active inflammatory infiltrate.  Ultrasound of the abdomen showed liver echogenicity overall increased in mildly inhomogeneous.  Appearance most likely indicate of of hepatic steatosis.  No focal liver lesions evident.  Admission chemistries BUN 37, creatinine 4.16, lactic acid 5.8, urinalysis negative nitrite, urine culture greater 100,000 Klebsiella, troponin 88, hemoglobin 12.2, WBC 32,900, blood cultures enterobacterales and Klebsiella.  Placed on broad-spectrum antibiotics presently on Rocephin for suspect sepsis secondary to community-acquired pneumonia and UTI through 07/28/2020 and stop.  Renal service is consulted for AKI placed on lactated Ringer's.  Renal ultrasound showed no hydronephrosis.  Small volume right upper quadrant ascites.  No indications for hemodialysis.  Infectious disease consulted 07/13/2020 for Klebsiella bacteremia and maintained on ceftriaxone x112 days that has since been completed.  CT of abdomen completed to look for renal/ureteric stone that showed 7 to 8 mm  proximal right ureteral stone with obstructive change and perinephric stranding.  Minimal free fluid within the abdomen and pelvis.  Urology consulted underwent cystoscopy as well as bilateral retrograde pyelogram with right and left ureteral stent placement 07/14/2020.  Patient would need bilateral outpatient ureteroscopy, laser lithotripsy and stent exchange in 4 to 8 weeks.  Patient did remain intubated for a short time.  Neurology consulted 07/16/2020 for right side weakness and slurred speech.  MRI showed severely limited evaluation with multiple sequences.  Diffusion-weighted imaging significantly limited with multiple small areas of DWI hyperintensity in the bilateral white matter representing artifact versus small infarcts.  CT angiogram of head and neck limited with no evidence of large vessel occlusion or significant arterial stenosis.  MRI later repeated 07/21/2020 unchanged DWI from prior imaging with small vague foci of trace diffusion abnormality suggesting scattered small foci of subacute white matter ischemia.  Underlying advanced but nonspecific chronic signal changes in the cerebral white matter in the pons redemonstrated most commonly due to small vessel disease.  EEG showed some background slowing with rare triphasic waves noted no seizure activity.  Echocardiogram with ejection fraction of 30 to 35% the left ventricle demonstrating global hypokinesis.  Echo bubble study no evidence of thrombus and grade 3 diastolic dysfunction.  TEE results pending.  Podiatry as well as vascular surgery consulted for necrosis dry gangrene of both feet left greater than right 07/20/2020 with ischemic changes.  Vascular surgery did not feel any CT angiogram or intervention was needed and advised to continue to follow.  HIT had been ruled out.  Currently maintained on subcutaneous heparin for DVT prophylaxis.  Review of Systems  Constitutional: Negative for chills and fever.  HENT: Positive for hearing loss.    Eyes: Negative for blurred vision and double vision.  Respiratory:  Positive for shortness of breath.   Cardiovascular: Positive for palpitations. Negative for chest pain and leg swelling.  Gastrointestinal: Positive for constipation. Negative for heartburn, nausea and vomiting.       GERD  Genitourinary: Negative for dysuria, flank pain and hematuria.  Musculoskeletal: Positive for joint pain.  Skin: Negative for rash.  All other systems reviewed and are negative.  Past Medical History:  Diagnosis Date  . Arrhythmia    patient unaware if this is current  . Asthma   . GERD (gastroesophageal reflux disease)   . Heart murmur   . History of kidney stones   . HOH (hard of hearing)    wear aids  . Hypothyroidism   . IBS (irritable bowel syndrome)   . Pulmonary hypertension ([place])   . Sarcoid   . Sarcoidosis   . Seasonal allergies   . Sleep apnea CPAP with O2  . Wears hearing aid in both ears    Past Surgical History:  Procedure Laterality Date  . CARDIAC CATHETERIZATION  10/18/2018   [facility]  . CATARACT EXTRACTION W/PHACO Left 07/31/2019   Procedure: CATARACT EXTRACTION PHACO AND INTRAOCULAR LENS PLACEMENT (IOC) LEFT 00:51.1  17.9%  9.15;  Surgeon: Leandrew Koyanagi, MD;  Location: [facility];  Service: Ophthalmology;  Laterality: Left;  keep this patient second  . COLON SURGERY     "colon was fused to bladder - operated on both"  . COLONOSCOPY  09/18/2007   diverticuli, no polyps  . COLONOSCOPY  05/26/2010   diverticuli, no polyps  . CYSTOSCOPY WITH STENT PLACEMENT Bilateral 07/14/2020   Procedure: CYSTOSCOPY WITH STENT PLACEMENT, RETROPYLOGRAM;  Surgeon: Billey Co, MD;  Location: [facility];  Service: Urology;  Laterality: Bilateral;  . CYSTOSCOPY/URETEROSCOPY/HOLMIUM LASER/STENT PLACEMENT Left 02/20/2020   Procedure: CYSTOSCOPY/URETEROSCOPY/LITHOTRIPSY /STENT PLACEMENT;  Surgeon: Hollice Espy, MD;  Location: [facility];  Service: Urology;  Laterality: Left;  .  PARS PLANA VITRECTOMY Right 05/20/2015   Procedure: PARS PLANA VITRECTOMY WITH 25 GAUGE, laser;  Surgeon: Milus Height, MD;  Location: [facility];  Service: Ophthalmology;  Laterality: Right;  . PARTIAL HYSTERECTOMY  1990  . TUBAL LIGATION     Family History  Problem Relation Age of Onset  . Allergies Father   . Asthma Father   . Colon cancer Father   . Allergies Brother   . Asthma Brother   . Breast cancer Maternal Grandmother    Social History:  reports that she has never smoked. She has never used smokeless tobacco. She reports that she does not drink alcohol and does not use drugs. Allergies:  Allergies  Allergen Reactions  . Nitrofurantoin Nausea Only  . Tramadol     Nausea and vomiting  . Sulfa Antibiotics Rash    As an infant   Medications Prior to Admission  Medication Sig Dispense Refill  . budesonide-formoterol (SYMBICORT) 80-4.5 MCG/ACT inhaler Take 2 puffs first thing in am and then another 2 puffs about 12 hours later. (Patient taking differently: Inhale 2 puffs into the lungs in the morning and at bedtime. Take 2 puffs first thing in am and then another 2 puffs about 12 hours later.) 1 Inhaler 11  . denosumab (PROLIA) 60 MG/ML SOSY injection Inject 60 mg into the skin every 6 (six) months.    . dorzolamide (TRUSOPT) 2 % ophthalmic solution Place 1 drop into both eyes 2 (two) times daily.    Marland Kitchen escitalopram (LEXAPRO) 20 MG tablet     . folic acid (FOLVITE) 1 MG tablet Take  1 mg by mouth daily.  11  . latanoprost (XALATAN) 0.005 % ophthalmic solution Place 1 drop into the right eye at bedtime.    . methotrexate (TREXALL) 5 MG tablet TAKE 5 TABLETS (25 MG TOTAL) BY MOUTH EVERY 7 (SEVEN) DAYS    . Multiple Vitamins-Minerals (MULTIVITAMIN WITH MINERALS) tablet Take 1 tablet by mouth daily.    . NON FORMULARY CPAP nightly    . predniSONE (DELTASONE) 10 MG tablet Take 10 mg by mouth daily with breakfast. 12.5 mg    . SYNTHROID 137 MCG tablet TAKE 1 TABLET (137 MCG TOTAL) BY  MOUTH DAILY BEFORE BREAKFAST. 90 tablet 0  . Treprostinil (TYVASO) 0.6 MG/ML SOLN Inhale 18 mcg into the lungs 4 (four) times daily. 12 breaths 4 times a day    . albuterol (VENTOLIN HFA) 108 (90 Base) MCG/ACT inhaler Inhale 2 puffs into the lungs every 6 (six) hours as needed for wheezing. (Patient not taking: Reported on 07/10/2020) 1 Inhaler 2  . brimonidine (ALPHAGAN) 0.2 % ophthalmic solution Place 1 drop into both eyes 3 (three) times daily.  (Patient not taking: Reported on 05/12/2020)    . calcium carbonate (TUMS - DOSED IN MG ELEMENTAL CALCIUM) 500 MG chewable tablet Chew 1 tablet by mouth daily as needed for indigestion or heartburn. (Patient not taking: Reported on 07/10/2020)    . cephALEXin (KEFLEX) 500 MG capsule Take 1 capsule (500 mg total) by mouth 3 (three) times daily. (Patient not taking: Reported on 06/12/2020) 21 capsule 0  . conjugated estrogens (PREMARIN) vaginal cream Apply one pea-sized amount around the opening of the urethra three times weekly. (Patient not taking: Reported on 07/10/2020) 30 g 3  . methotrexate (RHEUMATREX) 2.5 MG tablet Take by mouth. (Patient not taking: Reported on 07/10/2020)    . mirabegron ER (MYRBETRIQ) 25 MG TB24 tablet Take 1 tablet (25 mg total) by mouth daily. (Patient not taking: Reported on 07/10/2020) 30 tablet 11  . oxybutynin (DITROPAN) 5 MG tablet  (Patient not taking: Reported on 07/10/2020)    . oxyCODONE-acetaminophen (PERCOCET/ROXICET) 5-325 MG tablet  (Patient not taking: Reported on 05/12/2020)    . OXYGEN Inhale 2 L into the lungs as needed.    Marland Kitchen Respiratory Therapy Supplies (FLUTTER) DEVI Use as directed (Patient not taking: Reported on 07/10/2020) 1 each 0  . VITAMIN D PO Take 1 tablet by mouth once a week. (Patient not taking: Reported on 07/10/2020)    . Vitamin D, Ergocalciferol, (DRISDOL) 1.25 MG (50000 UNIT) CAPS capsule Take 50,000 Units by mouth once a week. (Patient not taking: Reported on 06/12/2020)      Drug Regimen Review Drug  regimen was reviewed and remains appropriate with no significant issues identified  Home: Home Living Family/patient expects to be discharged to:: Private residence Living Arrangements: Spouse/significant other Available Help at Discharge: Family Type of Home: House Home Access: Stairs to enter Technical brewer of Steps: 2 (2) Entrance Stairs-Rails: None Home Layout: Two level, Able to live on main level with bedroom/bathroom Alternate Level Stairs-Number of Steps: 15 (15) Alternate Level Stairs-Rails: Left Bathroom Shower/Tub: Tub/shower unit, Estate agent Accessibility: Yes Additional Comments: Patient limited historian; will verify with family as available  Lives With: Spouse   Functional History: Prior Function Level of Independence: Independent with assistive device(s) Comments: Indep with ADLs, household and limited community mobilization; home O2 at 2L.  Functional Status:  Mobility: Bed Mobility Overal bed mobility: Needs Assistance Bed Mobility: Supine to Sit, Sit to Supine,  Rolling Rolling: Min guard Supine to sit: Mod assist, HOB elevated Sit to supine: Max assist, +2 for physical assistance General bed mobility comments: Mod A for trunk elevation and BLE management to and over edge of bed with increased verbal/tactile cues; max+2 A for sit to supine for trunk and BLE assist Transfers Overall transfer level: Needs assistance Equipment used: None Transfers: Sit to/from Stand Sit to Stand: Mod assist, +2 physical assistance, From elevated surface General transfer comment: Mod+2 A for sit <> stand transfers x 2 for boosting to stand and eccentric control on descent x 2 trials, 3rd attempt made however pt unable to perform secondary to fatigue with max+2 A Ambulation/Gait General Gait Details: not safe to attempt at this time    ADL: ADL Overall ADL's : Needs assistance/impaired General ADL Comments: OTR provided  max/hand over hand assist for grooming/feeding tasks at bedlevel.  Pt remains total assist for lower body dressing and toileting at bedlevel.  Cognition: Cognition Overall Cognitive Status: Impaired/Different from baseline Arousal/Alertness: Awake/alert Orientation Level: Oriented to person, Oriented to place, Oriented to time Attention: Sustained Sustained Attention: Impaired Sustained Attention Impairment: Verbal basic, Functional basic Memory: Impaired Memory Impairment: Decreased recall of new information, Retrieval deficit, Decreased short term memory Decreased Short Term Memory: Verbal basic, Functional basic Awareness: Impaired Awareness Impairment: Intellectual impairment, Emergent impairment, Anticipatory impairment Problem Solving: Impaired Problem Solving Impairment: Verbal basic, Functional basic Executive Function:  (all impaired by lower level deficits) Behaviors: Impulsive Cognition Arousal/Alertness: Awake/alert Behavior During Therapy: WFL for tasks assessed/performed Overall Cognitive Status: Impaired/Different from baseline Area of Impairment: Memory, Following commands, Safety/judgement, Awareness, Problem solving Memory: Decreased short-term memory Following Commands: Follows one step commands inconsistently, Follows one step commands with increased time Safety/Judgement: Decreased awareness of deficits, Decreased awareness of safety Problem Solving: Slow processing, Decreased initiation, Difficulty sequencing, Requires verbal cues, Requires tactile cues General Comments: Multimodal cues for all tasks and reuqires increased tome for processing single step commands. Difficult to assess due to:  (Pt answers inconsistently)  Physical Exam: Blood pressure (!) 144/66, pulse 89, temperature 98.1 F (36.7 C), temperature source Oral, resp. rate 20, height 5' 7.01" (1.702 m), weight 85.1 kg, SpO2 96 %.   General: Alert and oriented x 3, No apparent distress HEENT:  Head is normocephalic, atraumatic, PERRLA, EOMI, sclera anicteric, oral mucosa pink and moist, dentition intact, ext ear canals clear,  Neck: Supple without JVD or lymphadenopathy Heart: Reg rate and rhythm. No murmurs rubs or gallops Chest: CTA bilaterally without wheezes, rales, or rhonchi; no distress Abdomen: Soft, non-tender, non-distended, bowel sounds positive. Extremities: Medial toes bilaterally are necrotic Skin: Clean and intact without signs of breakdown Neuro: Patient is alert a bit restless.  Mild right facial droop. Needs verbal cues for direction and easily distracted. Left side 5/5, right side 4+/5 throughout.  Psych: Pt's affect is appropriate. Pt is cooperative GU: foley in place   Results for orders placed or performed during the hospital encounter of 07/09/20 (from the past 48 hour(s))  Glucose, capillary     Status: Abnormal   Collection Time: 07/21/20  8:05 PM  Result Value Ref Range   Glucose-Capillary 231 (H) 70 - 99 mg/dL    Comment: Glucose reference range applies only to samples taken after fasting for at least 8 hours.  Glucose, capillary     Status: Abnormal   Collection Time: 07/22/20 12:34 AM  Result Value Ref Range   Glucose-Capillary 195 (H) 70 - 99 mg/dL    Comment: Glucose reference  range applies only to samples taken after fasting for at least 8 hours.   Comment 1 Notify RN   CBC with Differential/Platelet     Status: Abnormal   Collection Time: 07/22/20  3:04 AM  Result Value Ref Range   WBC 21.6 (H) 4.0 - 10.5 K/uL   RBC 4.20 3.87 - 5.11 MIL/uL   Hemoglobin 12.8 12.0 - 15.0 g/dL   HCT 40.9 36 - 46 %   MCV 97.4 80.0 - 100.0 fL   MCH 30.5 26.0 - 34.0 pg   MCHC 31.3 30.0 - 36.0 g/dL   RDW 14.0 11.5 - 15.5 %   Platelets 198 150 - 400 K/uL   nRBC 0.0 0.0 - 0.2 %   Neutrophils Relative % 86 %   Neutro Abs 18.5 (H) 1.7 - 7.7 K/uL   Lymphocytes Relative 3 %   Lymphs Abs 0.7 0.7 - 4.0 K/uL   Monocytes Relative 9 %   Monocytes Absolute 2.0 (H)  0.1 - 1.0 K/uL   Eosinophils Relative 0 %   Eosinophils Absolute 0.0 0 - 0 K/uL   Basophils Relative 0 %   Basophils Absolute 0.1 0 - 0 K/uL   Immature Granulocytes 2 %   Abs Immature Granulocytes 0.39 (H) 0.00 - 0.07 K/uL    Comment: Performed at [facility], [address] [place], [place] [address]  Phosphorus     Status: None   Collection Time: 07/22/20  3:04 AM  Result Value Ref Range   Phosphorus 4.0 2.5 - 4.6 mg/dL    Comment: Performed at [facility], [address] [place], [place] [address]  Magnesium     Status: None   Collection Time: 07/22/20  3:04 AM  Result Value Ref Range   Magnesium 1.7 1.7 - 2.4 mg/dL    Comment: Performed at [facility], [address] [place], [place] [address]  Basic metabolic panel     Status: Abnormal   Collection Time: 07/22/20  3:04 AM  Result Value Ref Range   Sodium 143 135 - 145 mmol/L   Potassium 4.5 3.5 - 5.1 mmol/L   Chloride 102 98 - 111 mmol/L   CO2 28 22 - 32 mmol/L   Glucose, Bld 152 (H) 70 - 99 mg/dL    Comment: Glucose reference range applies only to samples taken after fasting for at least 8 hours.   BUN 40 (H) 8 - 23 mg/dL   Creatinine, Ser 1.18 (H) 0.44 - 1.00 mg/dL   Calcium 8.4 (L) 8.9 - 10.3 mg/dL   GFR calc non Af Amer 48 (L) >60 mL/min   Anion gap 13 5 - 15    Comment: Performed at [facility], [address] [place], [place] [address]  Glucose, capillary     Status: Abnormal   Collection Time: 07/22/20  4:41 AM  Result Value Ref Range   Glucose-Capillary 155 (H) 70 - 99 mg/dL    Comment: Glucose reference range applies only to samples taken after fasting for at least 8 hours.   Comment 1 Notify RN   Glucose, capillary     Status: Abnormal   Collection Time: 07/22/20  7:38 AM  Result Value Ref Range   Glucose-Capillary 134 (H) 70 - 99 mg/dL    Comment: Glucose reference range applies only to samples taken after fasting for at least 8 hours.   Comment 1 Notify RN    Glucose, capillary     Status: Abnormal   Collection  Time: 07/22/20 12:49 PM  Result Value Ref Range   Glucose-Capillary 159 (H) 70 - 99 mg/dL    Comment: Glucose reference range applies only to samples taken after fasting for at least 8 hours.   Comment 1 Notify RN   Glucose, capillary     Status: Abnormal   Collection Time: 07/22/20  3:52 PM  Result Value Ref Range   Glucose-Capillary 266 (H) 70 - 99 mg/dL    Comment: Glucose reference range applies only to samples taken after fasting for at least 8 hours.   Comment 1 Notify RN   Glucose, capillary     Status: Abnormal   Collection Time: 07/22/20  7:43 PM  Result Value Ref Range   Glucose-Capillary 212 (H) 70 - 99 mg/dL    Comment: Glucose reference range applies only to samples taken after fasting for at least 8 hours.  Glucose, capillary     Status: Abnormal   Collection Time: 07/23/20  4:00 AM  Result Value Ref Range   Glucose-Capillary 119 (H) 70 - 99 mg/dL    Comment: Glucose reference range applies only to samples taken after fasting for at least 8 hours.  CBC with Differential/Platelet     Status: Abnormal   Collection Time: 07/23/20  4:13 AM  Result Value Ref Range   WBC 19.1 (H) 4.0 - 10.5 K/uL   RBC 4.19 3.87 - 5.11 MIL/uL   Hemoglobin 12.9 12.0 - 15.0 g/dL   HCT 40.4 36 - 46 %   MCV 96.4 80.0 - 100.0 fL   MCH 30.8 26.0 - 34.0 pg   MCHC 31.9 30.0 - 36.0 g/dL   RDW 14.6 11.5 - 15.5 %   Platelets 236 150 - 400 K/uL   nRBC 0.0 0.0 - 0.2 %   Neutrophils Relative % 82 %   Neutro Abs 15.8 (H) 1.7 - 7.7 K/uL   Lymphocytes Relative 5 %   Lymphs Abs 0.9 0.7 - 4.0 K/uL   Monocytes Relative 10 %   Monocytes Absolute 2.0 (H) 0.1 - 1.0 K/uL   Eosinophils Relative 1 %   Eosinophils Absolute 0.1 0 - 0 K/uL   Basophils Relative 0 %   Basophils Absolute 0.1 0 - 0 K/uL   Immature Granulocytes 2 %   Abs Immature Granulocytes 0.31 (H) 0.00 - 0.07 K/uL    Comment: Performed at [facility], [address],  [place], [place] [address]  Phosphorus     Status: None   Collection Time: 07/23/20  4:13 AM  Result Value Ref Range   Phosphorus 3.5 2.5 - 4.6 mg/dL    Comment: Performed at [facility], [address] [place], [place] [address]  Magnesium     Status: None   Collection Time: 07/23/20  4:13 AM  Result Value Ref Range   Magnesium 2.1 1.7 - 2.4 mg/dL    Comment: Performed at [facility], [address] [place], [place] [address]  Basic metabolic panel     Status: Abnormal   Collection Time: 07/23/20  4:13 AM  Result Value Ref Range   Sodium 143 135 - 145 mmol/L   Potassium 3.7 3.5 - 5.1 mmol/L   Chloride 99 98 - 111 mmol/L   CO2 32 22 - 32 mmol/L   Glucose, Bld 108 (H) 70 - 99 mg/dL    Comment: Glucose reference range applies only to samples taken after fasting for at least 8 hours.   BUN 38 (H) 8 - 23 mg/dL  Creatinine, Ser 1.20 (H) 0.44 - 1.00 mg/dL   Calcium 8.8 (L) 8.9 - 10.3 mg/dL   GFR calc non Af Amer 47 (L) >60 mL/min   Anion gap 12 5 - 15    Comment: Performed at [facility], [address] [place], [place] [address]  Glucose, capillary     Status: Abnormal   Collection Time: 07/23/20  7:41 AM  Result Value Ref Range   Glucose-Capillary 107 (H) 70 - 99 mg/dL    Comment: Glucose reference range applies only to samples taken after fasting for at least 8 hours.   Comment 1 Notify RN    Comment 2 Document in Chart   Glucose, capillary     Status: Abnormal   Collection Time: 07/23/20 11:38 AM  Result Value Ref Range   Glucose-Capillary 161 (H) 70 - 99 mg/dL    Comment: Glucose reference range applies only to samples taken after fasting for at least 8 hours.   Comment 1 Notify RN    Comment 2 Document in Chart   Glucose, capillary     Status: Abnormal   Collection Time: 07/23/20  4:34 PM  Result Value Ref Range   Glucose-Capillary 197 (H) 70 - 99 mg/dL    Comment: Glucose reference range applies only to samples taken after fasting for at  least 8 hours.   Comment 1 Notify RN    Comment 2 Document in Chart    No results found.     Medical Problem List and Plan: 1.  Altered mental status with decreased functional mobility secondary to sepsis/encephalopathy patient completed 112 days of ceftriaxone as well as recent course of Rocephin  -patient may shower  -ELOS/Goals: minA 20-24 days 2.  Antithrombotics: -DVT/anticoagulation: Subcutaneous heparin  -antiplatelet therapy: Aspirin 81 mg daily 3. Pain Management: Tylenol as needed. Well controlled.  4. Mood: Lexapro 20 mg daily  -antipsychotic agents: N/A 5. Neuropsych: This patient is capable of making decisions on her own behalf. 6. Skin/Wound Care: Routine skin checks 7. Fluids/Electrolytes/Nutrition: Routine in and outs with follow-up chemistries 8.  Pulmonary sarcoidosis.  Patient on 2 L nasal cannula baseline.  Follow-up outpatient 9.  Hypertension.  Monitor with increased mobility. Well controlled.  10.  Asthma.  Continue inhalers as directed 11.  Hyperlipidemia.  Lipitor 12.  Hypothyroidism.  Synthroid 13.  AKI.  Follow-up chemistries 14.  Bilateral lower extremity ischemia.  No urgent indication for lower extremity angiogram at present.  Follow-up vascular surgery. 15.  Urinary retention.  DC foley and initiate voiding trial  Wretha Parsons, PA-C 07/23/2020   I have personally performed a face to face diagnostic evaluation, including, but not limited to relevant history and physical exam findings, of this patient and developed relevant assessment and plan.  Additionally, I have reviewed and concur with the physician assistant's documentation above.  Leeroy Cha, MD

## 2020-07-23 NOTE — Progress Notes (Signed)
Nutrition Follow-up  DOCUMENTATION CODES:   Not applicable  INTERVENTION:  Initiate 48 hour calorie count.  Continue Ensure Enlive po TID, each supplement provides 350 kcal and 20 grams of protein.  Continue double protein with meals.  Provide Magic cup TID with meals, each supplement provides 290 kcal and 9 grams of protein.  Recommend placement of small-bore feeding tube (Dobbhoff tube) for initiation of tube feeds. Family and medical team are discussing. If plan is for tube feeds recommend:  -Initiate Jevity 1.5 Cal at 20 mL/hr and advance by 20 mL/hr every 12 hours to goal rate of 60 mL/hr -PROSource TF 45 mL BID per tube -Goal regimen provides 2240 kcal, 114 grams of protein, 1094 mL H2O daily  If tube feeds are started recommend minimum free water flush of 30 mL Q4hrs to maintain tube patency. Could increase as needed pending PO intake of fluids.  Monitor magnesium, potassium, and phosphorus daily for at least 3 days, MD to replete as needed, as pt is at risk for refeeding syndrome given prolonged inadequate oral intake.  NUTRITION DIAGNOSIS:   Inadequate oral intake related to decreased appetite, lethargy/confusion as evidenced by meal completion < 50%.  New nutrition diagnosis.  GOAL:   Patient will meet greater than or equal to 90% of their needs  Not met.  MONITOR:   PO intake, Supplement acceptance, Diet advancement, Labs, Weight trends, I & O's  REASON FOR ASSESSMENT:   NPO/Clear Liquid Diet    ASSESSMENT:   64 year old female with PMHx of sarcoidosis, asthma, sleep apnea, IBS, hypothyroidism, GERD admitted with sepsis, acute on chronic hypoxic respiratory failure int he setting of community acquired PNA and acute exacerbation of pulmonary sarcoidosis requiring BiPAP, also with UTI, AKI, AMS.   9/24 admitted and placed on BiPAP 9/28 intubated 9/29 extubated 9/30 code stroke called  9/30 MRI brain with small acute infarcts concerning for possible  embolic source 16/6 diet advanced to dysphagia 1 with nectar-thick liquids 10/4 diet advanced to dysphagia 2 with thin liquids  Met with patient's daughter at bedside this morning. Patient was tired after participating in PT and was sleeping. Per daughter patient has poor PO intake. On 10/5 she ate 10% of breakfast, 30% of lunch, and 0% of dinner per review of chart. Yesterday she was only able to eat 1/2 of an Ensure Enlive (175 kcal, 10 grams of protein) and bites of crackers. This morning for breakfast she had 3-4 bites of eggs and sips of Ensure. Corn allergy was removed per request of family so patient now has more options on the menu. RD discussed concern for prolonged inadequate oral intake and recommendation for feeding tube so patient's calorie/protein needs can be met. Patient's family is discussing and considering. One consideration is that plan is for TEE on Monday so tube would likely be removed, but that is still 5 days away and that would be 5 more days added onto existing calorie/protein deficit. Plan is to start calorie count.  Medications reviewed and include: Colace 100 mg BID, Diflucan, Novolog 0-15 units TID, levothyroxine, Solu-Medrol 20 mg daily IV, MVI daily, Protonix, Miralax, thiamine 100 mg daily IV, ceftriaxone. Patient was on D5W at 40 mL/hr in setting of hypernatremia but it was discontinued today.  Labs reviewed: CBG 107-212, BUN 38, Creatinine 1.2.  I/O: 500 mL UOP yesterday (0.2 mL/kg/hr)  Weight trend: 85.1 kg on 10/7; +1.2 kg from 9/24; pt with moderate pitting edema which is likely masking true weight loss  Patient  is at risk for malnutrition.  Discussed with MD and RN via secure chat.  Diet Order:   Diet Order            Diet NPO time specified  Diet effective midnight           DIET DYS 2 Room service appropriate? Yes; Fluid consistency: Thin  Diet effective now                EDUCATION NEEDS:   No education needs have been identified at this  time  Skin:  Skin Assessment: Reviewed RN Assessment (ecchymosis and petechiae)  Last BM:  07/23/2020 - smear  Height:   Ht Readings from Last 1 Encounters:  07/14/20 5' 7.01" (1.702 m)   Weight:   Wt Readings from Last 1 Encounters:  07/23/20 85.1 kg   BMI:  Body mass index is 29.38 kg/m.  Estimated Nutritional Needs:   Kcal:  2050-2250  Protein:  105-115 grams  Fluid:  >/= 2 L/day  Jacklynn Barnacle, MS, RD, LDN Pager number available on Amion

## 2020-07-24 LAB — BASIC METABOLIC PANEL
Anion gap: 11 (ref 5–15)
BUN: 38 mg/dL — ABNORMAL HIGH (ref 8–23)
CO2: 31 mmol/L (ref 22–32)
Calcium: 9 mg/dL (ref 8.9–10.3)
Chloride: 101 mmol/L (ref 98–111)
Creatinine, Ser: 1.27 mg/dL — ABNORMAL HIGH (ref 0.44–1.00)
GFR calc non Af Amer: 44 mL/min — ABNORMAL LOW (ref >60)
Glucose, Bld: 107 mg/dL — ABNORMAL HIGH (ref 70–99)
Potassium: 4.3 mmol/L (ref 3.5–5.1)
Sodium: 143 mmol/L (ref 135–145)

## 2020-07-24 LAB — MAGNESIUM: Magnesium: 1.9 mg/dL (ref 1.7–2.4)

## 2020-07-24 LAB — PHOSPHORUS: Phosphorus: 3.9 mg/dL (ref 2.5–4.6)

## 2020-07-24 LAB — GLUCOSE, CAPILLARY
Glucose-Capillary: 117 mg/dL — ABNORMAL HIGH (ref 70–99)
Glucose-Capillary: 181 mg/dL — ABNORMAL HIGH (ref 70–99)
Glucose-Capillary: 268 mg/dL — ABNORMAL HIGH (ref 70–99)
Glucose-Capillary: 212 mg/dL — ABNORMAL HIGH (ref 70–99)

## 2020-07-24 MED ORDER — PREDNISONE 20 MG PO TABS
30.0000 mg | ORAL_TABLET | Freq: Once | ORAL | Status: AC
  Administered 2020-07-26: 30 mg via ORAL
  Filled 2020-07-24: qty 1

## 2020-07-24 MED ORDER — PREDNISONE 50 MG PO TABS
35.0000 mg | ORAL_TABLET | Freq: Once | ORAL | Status: AC
  Administered 2020-07-25: 35 mg via ORAL
  Filled 2020-07-24 (×2): qty 1

## 2020-07-24 MED ORDER — FLUCONAZOLE 100MG IVPB
100.0000 mg | INTRAVENOUS | Status: DC
  Administered 2020-07-24 – 2020-07-27 (×4): 100 mg via INTRAVENOUS
  Filled 2020-07-24 (×5): qty 50

## 2020-07-24 MED ORDER — PREDNISONE 20 MG PO TABS
20.0000 mg | ORAL_TABLET | Freq: Once | ORAL | Status: AC
  Administered 2020-07-28: 20 mg via ORAL
  Filled 2020-07-24: qty 1

## 2020-07-24 MED ORDER — PREDNISONE 50 MG PO TABS
25.0000 mg | ORAL_TABLET | Freq: Once | ORAL | Status: AC
  Administered 2020-07-27: 25 mg via ORAL
  Filled 2020-07-24: qty 1
  Filled 2020-07-24: qty 0.5

## 2020-07-24 MED ORDER — PREDNISONE 10 MG PO TABS: 10.0000 mg | ORAL_TABLET | Freq: Every day | ORAL | Status: DC

## 2020-07-24 MED ORDER — PREDNISONE 10 MG PO TABS
15.0000 mg | ORAL_TABLET | Freq: Once | ORAL | Status: DC
  Filled 2020-07-24: qty 1.5

## 2020-07-24 MED ORDER — PREDNISONE 20 MG PO TABS
40.0000 mg | ORAL_TABLET | Freq: Once | ORAL | Status: AC
  Administered 2020-07-24: 40 mg via ORAL
  Filled 2020-07-24: qty 2

## 2020-07-24 NOTE — Progress Notes (Signed)
[place], [place] 07/24/20  Subjective:   Hospital day # 14  Patient patient has speech evaluation going on at the bedside, while she is consuming breakfast.  She denies shortness of breath, nausea or vomiting.  She has complaints of intermittent lower abdominal pain, due to urinary retention.   Renal: 10/07 0701 - 10/08 0700 In: 210.7 [P.O.:120; I.V.:90.7] Out: 1250 [Urine:1250]   Objective:  Vital signs in last 24 hours:  Temp:  [97 F (36.1 C)-98.5 F (36.9 C)] 98.2 F (36.8 C) (10/08 0743) Pulse Rate:  [87-94] 90 (10/08 0743) Resp:  [14-20] 19 (10/08 0743) BP: (124-144)/(46-77) 130/60 (10/08 0743) SpO2:  [94 %-100 %] 94 % (10/08 0743) Weight:  [84 kg] 84 kg (10/08 0500)  Weight change: -1.1 kg Filed Weights   07/19/20 0500 07/23/20 0500 07/24/20 0500  Weight: 86.3 kg 85.1 kg 84 kg    Intake/Output:    Intake/Output Summary (Last 24 hours) at 07/24/2020 1024 Last data filed at 07/24/2020 1008 Gross per 24 hour  Intake 550.73 ml  Output 1250 ml  Net -699.27 ml     Physical Exam: General:  Sitting up, consuming breakfast, speech therapist at the bedside  HEENT  Nasal cannula in place with 2 L of oxygen  Pulm/lungs  wheezes +,able to speak in full sentences,no accessory muscle use  CVS/Heart  S1S2 + regular  Abdomen:   Soft, suprapubic tenderness+  Extremities:  No peripheral edema, purplish discoloration of toes +  Neurologic:  Normal affect, speech clear and appropriate  Skin:  No acute lesions or rashes,Necrotic Left 2nd, 3rd toes    Basic Metabolic Panel:  Recent Labs  Lab 07/20/20 0415 07/20/20 0415 07/21/20 0617 07/21/20 0617 07/22/20 0304 07/23/20 0413 07/24/20 0341  NA 148*  --  141  --  143 143 143  K 4.0  --  4.2  --  4.5 3.7 4.3  CL 104  --  101  --  102 99 101  CO2 32  --  29  --  28 32 31  GLUCOSE 124*  --  144*  --  152* 108* 107*  BUN 40*  --  41*  --  40* 38* 38*  CREATININE 1.19*  --  1.18*  --   1.18* 1.20* 1.27*  CALCIUM 8.0*   < > 8.0*   < > 8.4* 8.8* 9.0  MG 2.2  --  1.8  --  1.7 2.1 1.9  PHOS 3.2  --  4.2  --  4.0 3.5 3.9   < > = values in this interval not displayed.     CBC: Recent Labs  Lab 07/19/20 0530 07/19/20 0530 07/20/20 0415 07/21/20 0617 07/21/20 1154 07/22/20 0304 07/23/20 0413  WBC 26.1*   < > 22.3* 20.6* 22.8* 21.6* 19.1*  NEUTROABS 22.2*  --  18.9* 17.1*  --  18.5* 15.8*  HGB 13.6   < > 14.4 13.5 13.4 12.8 12.9  HCT 43.9   < > 45.0 43.0 43.4 40.9 40.4  MCV 100.2*   < > 96.4 99.3 99.5 97.4 96.4  PLT 97*   < > 139* 159 172 198 236   < > = values in this interval not displayed.      Lab Results  Component Value Date   HEPBSAG NON REACTIVE 07/15/2020   HEPBIGM NON REACTIVE 07/15/2020      Microbiology:  Recent Results (from the past 240 hour(s))  Culture, blood (Routine X 2) w Reflex to ID Panel  Status: None   Collection Time: 07/16/20  1:29 PM   Specimen: BLOOD  Result Value Ref Range Status   Specimen Description BLOOD LEFT ANTECUBITAL  Final   Special Requests   Final    AEROBIC BOTTLE ONLY Blood Culture results may not be optimal due to an inadequate volume of blood received in culture bottles   Culture   Final    NO GROWTH 5 DAYS Performed at [facility], [address] [place], [place] [address]    Report Status 07/21/2020 FINAL  Final  Culture, blood (Routine X 2) w Reflex to ID Panel     Status: None   Collection Time: 07/16/20  5:09 PM   Specimen: BLOOD  Result Value Ref Range Status   Specimen Description BLOOD BLOOD RIGHT HAND  Final   Special Requests   Final    BOTTLES DRAWN AEROBIC ONLY Blood Culture results may not be optimal due to an inadequate volume of blood received in culture bottles   Culture   Final    NO GROWTH 5 DAYS Performed at [facility], [address] [place], [place] [address]    Report Status 07/21/2020 FINAL  Final    Coagulation Studies: Recent Labs     07/21/20 1154  LABPROT 13.0  INR 1.0    Urinalysis: No results for input(s): COLORURINE, LABSPEC, PHURINE, GLUCOSEU, HGBUR, BILIRUBINUR, KETONESUR, PROTEINUR, UROBILINOGEN, NITRITE, LEUKOCYTESUR in the last 72 hours.  Invalid input(s): APPERANCEUR    Imaging: No results found.   Medications:   . sodium chloride 20 mL/hr at 07/23/20 2027  . cefTRIAXone (ROCEPHIN)  IV 2 g (07/23/20 1843)   . arformoterol  15 mcg Nebulization BID  . aspirin EC  81 mg Oral Daily  . atorvastatin  40 mg Oral Daily  . budesonide (PULMICORT) nebulizer solution  0.25 mg Nebulization BID  . Chlorhexidine Gluconate Cloth  6 each Topical Q0600  . docusate  100 mg Oral BID  . dorzolamide  1 drop Both Eyes BID  . escitalopram  20 mg Oral Daily  . feeding supplement (ENSURE ENLIVE)  237 mL Oral TID BM  . fluconazole  100 mg Oral Daily  . heparin injection (subcutaneous)  5,000 Units Subcutaneous Q8H  . insulin aspart  0-15 Units Subcutaneous TID AC & HS  . latanoprost  1 drop Right Eye QHS  . levothyroxine  137 mcg Oral Q0600  . multivitamin with minerals  1 tablet Oral Daily  . nystatin  1 application Topical TID  . oxybutynin  5 mg Oral QHS  . pantoprazole  40 mg Oral Daily  . polyethylene glycol  17 g Oral Daily  . predniSONE  40 mg Oral Once   Followed by  . [START ON 07/25/2020] predniSONE  35 mg Oral Once   Followed by  . [START ON 07/26/2020] predniSONE  30 mg Oral Once   Followed by  . [START ON 07/27/2020] predniSONE  25 mg Oral Once   Followed by  . [START ON 07/28/2020] predniSONE  20 mg Oral Once   Followed by  . [START ON 07/29/2020] predniSONE  15 mg Oral Once   Followed by  . [START ON 07/30/2020] predniSONE  10 mg Oral Q breakfast  . thiamine injection  100 mg Intravenous Daily  . Treprostinil  18 mcg Inhalation QID   acetaminophen, bisacodyl, docusate sodium, polyethylene glycol  Assessment/ Plan:  64 y.o. female with  sarcoidosis, interstitial lung disease, obstructive  sleep apnea, depression, GERD, hyperlipidemia,  hypothyroidism   admitted on 07/09/2020 for Elevated LFTs [R79.89] Sepsis ([place]) [A41.9] Sepsis, due to unspecified organism, unspecified whether acute organ dysfunction present [facility]) [A41.9]  # AKI Baseline Cr 1.0 in May 2021 Admi Cr 4.16, peaked at 4.57  Lab Results  Component Value Date   CREATININE 1.27 (H) 07/24/2020   CREATININE 1.20 (H) 07/23/2020   CREATININE 1.18 (H) 07/22/2020   Patient's oral intake is poor. Speech evaluation in process Encouraged oral fluids  #Sepsis from urinary source Klebsiella pneumonia and blood in urine Patient has significant history of renal stones.  History of left ureteroscopy May 2021 Urology placed ureteral stents bilaterally on 07/14/20 She is on Ceftriaxone started on 07/10/20, plan to continue until 07/26/2020 per ID recommendation She still has c/o intermittent lower abdominal pain with urinary retention and constipation Continue management as per urology team  #History of pulmonary sarcoidosis, pulmonary hypertension Acute on chronic respiratory failure, community-acquired pneumonia  requires home oxygen and CPAP Was intubated and mechanically ventilated - now extubated -Patient breathing comfortably, on 2L supplemental O2 via [place]. - on Prednisone  and bronchodilators  # Hypernatremia Serum sodium stable at 143 again today Encourage oral intake Follow speech and dietary team recomendations   LOS: 14 Brynley Cuddeback 10/8/202110:24 AM  [place], [place]  Note: This note was prepared with Dragon dictation. Any transcription errors are unintentional

## 2020-07-24 NOTE — Progress Notes (Signed)
Inpatient Rehab Admissions Coordinator:  Attempted to call pt's room and received no answer. Called pt's daughter, [name], and informed her that insurance authorization has been received from [employer].  Awaiting for pt's medical workup to be completed and CIR bed availability in order to potentially admit pt to CIR.  Will continue to follow.   Gayland Curry, [place], [place] Admissions Coordinator [phone]

## 2020-07-24 NOTE — Progress Notes (Signed)
Speech Language Pathology Treatment: Dysphagia;Cognitive-Linquistic  Patient Details Name: Stacie [name] MRN: [id number] DOB: [birth year] Today's Date: 07/24/2020 Time: 0830-0900 SLP Time Calculation (min) (ACUTE ONLY): 30 min  Assessment / Plan / Recommendation Clinical Impression  The room was dark pt reclined in bed at 45 degree angle with breakfast tray positioned across pt's knees and out of reach for pt. This Clinical research associate provided extensive education to pt's nurse on pt's cognitive impairments and safety risk as well as decreased PO intake that occurs with pt's current meal set-up. SLP repositioned pt and set up dysphagia 2 breakfast tray with thin liquids. Pt consumed dysphagia 2 items with thin liquids via cup and straw without any overt s/s of aspiration or dysphagia. Pt required maximal cues for sustained attention to self-feeding as she is easily distracted by other items and objects on her tray.   Recommendations: Remove all distractions from her tray table except for her plate and 1 drink  Make sure pt it sitting up in bed, lights on, TV off when eating Full supervision by nursing for cognitive redirection  Family may bring in food items that are not within current diet  Extensive education provided to pt's nurse. This Clinical research associate also spoke at length with pt's daughter Leotis Shames about recommendations and strategies.     HPI HPI: Stacie [name] is a 64 y.o. female with a past medical history significant for pulmonary sarcoidosis (on 2L [place] at baseline), pulmonary hypertension, OSA, asthma, hypertension, hypothyroidism, IBS, and chronic kidney disease who presents to [facility] ED on 07/09/20 due to altered mental status and shortness of breath. Patient found to have Klebsiella pneumoniae bacteremia due to UTI, associated encephalopathy requiring BiPAP.  Patient noted to have ischemic toes on July 13, 2020.  Vascular surgery was consulted by NP [place] in regard to possible "bilateral lower  extremity ischemia".  MRI on 07/20/2020 revealed a few punctate foci of restricted diffusion/T2 hyperintensity within the white matter of the bilateral frontal lobes, suggesting acute/subacute infarcts, likely embolic, with  moderate to advanced white matter disease, likely chronic microangiopathy.      SLP Plan  Continue with current plan of care       Recommendations  Diet recommendations: Dysphagia 2 (fine chop);Thin liquid Liquids provided via: Straw Medication Administration: Whole meds with puree Supervision: Full supervision/cueing for compensatory strategies;Staff to assist with self feeding Compensations: Minimize environmental distractions;Slow rate;Small sips/bites Postural Changes and/or Swallow Maneuvers: Seated upright 90 degrees                Oral Care Recommendations: Oral care BID Follow up Recommendations: Inpatient Rehab SLP Visit Diagnosis: Dysphagia, oropharyngeal phase (R13.12);Cognitive communication deficit (R41.841) Plan: Continue with current plan of care       GO               Shamonica Schadt B. Dreama Saa M.S., CCC-SLP, [facility] Speech-Language Pathologist Rehabilitation Services Office [phone]  Reuel Derby 07/24/2020, 12:46 PM

## 2020-07-24 NOTE — Progress Notes (Signed)
Attempted to assist pt to [facility] pt sat on edge of bed and states she was too tired and SOB to get up. Pt has smear of bm and minimal drops of urine on bedpan. Will bladder scan and continue to monitor.

## 2020-07-24 NOTE — Progress Notes (Signed)
[facility] Daily Progress Note   Subjective: Patient without complaint. Denies any lower extremity pain.   Objective: Vitals:   07/23/20 2348 07/24/20 0407 07/24/20 0500 07/24/20 0743  BP: (!) 141/77 (!) 124/46  130/60  Pulse: 88 87  90  Resp: _0 Temp: 97.6 F (36.4 C) 98.5 F (36.9 C)  98.2 F (36.8 C)  TempSrc: Oral Oral    SpO2: 97% 96%  94%  Weight:   84 kg   Height:        Intake/Output Summary (Last 24 hours) at 07/24/2020 1124 Last data filed at 07/24/2020 1008 Gross per 24 hour  Intake 550.73 ml  Output 1250 ml  Net -699.27 ml   Physical Exam: A&Ox3, NAD CV: RRR Pulmonary: CTA Bilaterally Abdomen: Soft, Nontender, Nondistended Vascular:             Right Lower Extremity: Thigh soft, calf soft. Warm. Discoloration to toes.              Left Lower Extremity:  Thigh soft, calf soft. Warm. Discoloration to toes. Stable gangrene to second / third toes., bottom of first toe.   Laboratory: CBC    Component Value Date/Time   WBC 19.1 (H) 07/23/2020 0413   HGB 12.9 07/23/2020 0413   HGB 11.7 07/13/2020 1301   HCT 40.4 07/23/2020 0413   HCT 39.2 05/27/2019 0947   PLT 236 07/23/2020 0413   PLT 299 05/27/2019 0947   BMET    Component Value Date/Time   NA 143 07/24/2020 0341   NA 144 05/27/2019 0947   NA 142 12/28/2012 0454   K 4.3 07/24/2020 0341   K 3.4 (L) 12/28/2012 0454   CL 101 07/24/2020 0341   CL 111 (H) 12/28/2012 0454   CO2 31 07/24/2020 0341   CO2 27 12/28/2012 0454   GLUCOSE 107 (H) 07/24/2020 0341   GLUCOSE 95 12/28/2012 0454   BUN 38 (H) 07/24/2020 0341   BUN 21 05/27/2019 0947   BUN 8 12/28/2012 0454   CREATININE 1.27 (H) 07/24/2020 0341   CREATININE 1.14 12/28/2012 0454   CALCIUM 9.0 07/24/2020 0341   CALCIUM 8.0 (L) 12/28/2012 0454   GFRNONAA 44 (L) 07/24/2020 0341   GFRNONAA 53 (L) 12/28/2012 0454   GFRAA 56 (L) 07/21/2020 0617   GFRAA >60 12/28/2012 0454   Assessment/Planning: The patient is a 64  year old female with multiple medical issues, recent critical illness, with gangrene to left second / third toes, bottom of first toe  1) Stable gangrene 2) On ASA 3) Patient without complaint. Denies any BLE pain. Sensate. 4) Plan to hold off intervention at this time as the patient is asymptomatic and gangrene is stable.  5) Will continue to follow / monitor  Discussed with Dr. Ellis Parents [facility] PA-C 07/24/2020 11:24 AM

## 2020-07-24 NOTE — Progress Notes (Signed)
Physical Therapy Treatment Patient Details Name: Stacie [name] MRN: [id number] DOB: 10/30/1955 Today's Date: 07/24/2020    History of Present Illness Stacie [name] is a 64 y.o. Female with a past medical history significant for pulmonary sarcoidosis (on 2L [place] at baseline), pulmonary hypertension, OSA, asthma, hypertension, hypothyroidism, IBS, and chronic kidney disease who presents to [facility] ED on 07/09/20 due to altered mental status and shortness of breath.  Admitted for management of acute hypoxic respiratory failure and sepsis related to CAP, UTI.  Hospital course significant for periods of delirium requiring precedex infusion; questionable DIC with ischemic changes to L foot, R great toe; cystoscopy with pyelogram (9/28); extubation (9/29); and acute code stroke (9/30) due to new-onset slurred speech, R hemiparesis (NIHSS 16).    PT Comments    Pt in bed with daughter at bedside upon arrival to room. Pt agreeable to PT session this morning. Pt with good self initiation of supine to sit from elevated HOB and required mod A for pivoting hips to edge of bed with multimodal cues for BLE advancement to and over edge of bed. Pt with SBA for sitting balance with BUE supported on bed. Pt continues to sit in forward slumped position - verbal cues given for improved upright posture with initial carryover however pt unable to maintain. Pt performed sit <> stand transfers x 2 with mod+2 A. Pt noted to power through BUE and BLE to come into standing and requires max+2 A for static standing balance. Pt able to stand for ~20 seconds each bout for pericare to be performed by daughter. Verbal cues for hip and truncal extension for improved upright posture. Daughter also assists with moving the bed to place [facility]. Pt with dyspnea on exertion after standing and required increased time to recover with pursed lip breathing. Pt fatigued and began to lie down requiring max A for truncal  And BLE management. Pt able to  reposition self in bed for improved alignment by moving trunk and BLE. Pt performed therex in bed for promotion of improved motor control, cognitive sequencing of tasks, and muscle strengthening. Pt requires continued cues to remain on task and increased time for processing. Pt with good effort throughout session and is very motivated to return to PLOF. Discharge recommendation remains appropriate. Will continue to follow pt and progress as tolerated.    Follow Up Recommendations  CIR     Equipment Recommendations       Recommendations for Other Services       Precautions / Restrictions Precautions Precautions: Fall Restrictions Weight Bearing Restrictions: No    Mobility  Bed Mobility Overal bed mobility: Needs Assistance Bed Mobility: Supine to Sit;Sit to Supine     Supine to sit: HOB elevated;Mod assist Sit to supine: Max assist   General bed mobility comments: Pt self initiated supine to sit transfer and required mod A for scooting hips toward edge of bed with multimodal cues for BLE management; max A for sit to supine for trunk control  Transfers Overall transfer level: Needs assistance Equipment used: None Transfers: Sit to/from Stand Sit to Stand: Mod assist;+2 physical assistance         General transfer comment: Verbal cues for anterior lean and powering through BUE/BLE; pt self initiated then required mod+2 A for sit <> stand transfer for coming into standing, standing balance, and eccentric control on descent  Ambulation/Gait             General Gait Details: not safe to attempt at this  time   Stairs             Wheelchair Mobility    Modified Rankin (Stroke Patients Only)       Balance Overall balance assessment: Needs assistance Sitting-balance support: Bilateral upper extremity supported;Feet supported Sitting balance-Leahy Scale: Fair Sitting balance - Comments: SBA to CGA for static sitting balance   Standing balance support:  Bilateral upper extremity supported Standing balance-Leahy Scale: Poor Standing balance comment: mod-max+2 for standing balance with BUE support on clinicians                            Cognition Arousal/Alertness: Awake/alert Behavior During Therapy: WFL for tasks assessed/performed Overall Cognitive Status: Impaired/Different from baseline                                 General Comments: pt with improved recall of verbal cues today and able to place BUE in correct position      Exercises Other Exercises Other Exercises: pt performed BUE reaching across midline and outside of BOS for truncal activation and BUE motor control Other Exercises: BLE leg presses x 10 and RLE heel slides x 5; requires increased time for processing and continuous cues to remain on task    General Comments General comments (skin integrity, edema, etc.): gangrenous/darkened areas noted on bilateral feet      Pertinent Vitals/Pain Pain Assessment: No/denies pain    Home Living                      Prior Function            PT Goals (current goals can now be found in the care plan section) Acute Rehab PT Goals Patient Stated Goal: to get moving/EOB PT Goal Formulation: With patient Time For Goal Achievement: 08/01/20 Potential to Achieve Goals: Fair Progress towards PT goals: Progressing toward goals    Frequency    Min 2X/week      PT Plan Current plan remains appropriate    Co-evaluation              AM-PAC PT "6 Clicks" Mobility   Outcome Measure  Help needed turning from your back to your side while in a flat bed without using bedrails?: A Little Help needed moving from lying on your back to sitting on the side of a flat bed without using bedrails?: A Lot Help needed moving to and from a bed to a chair (including a wheelchair)?: A Lot Help needed standing up from a chair using your arms (e.g., wheelchair or bedside chair)?: A Lot Help needed  to walk in hospital room?: Total Help needed climbing 3-5 steps with a railing? : Total 6 Click Score: 11    End of Session Equipment Utilized During Treatment: Gait belt;Oxygen;Other (comment) (2L) Activity Tolerance: Patient tolerated treatment well;Patient limited by fatigue Patient left: in bed;with call bell/phone within reach;with bed alarm set;with family/visitor present Nurse Communication: Mobility status PT Visit Diagnosis: Muscle weakness (generalized) (M62.81);Hemiplegia and hemiparesis Hemiplegia - Right/Left: Right Hemiplegia - dominant/non-dominant: Dominant Hemiplegia - caused by: Cerebral infarction     Time: 7290-2111 PT Time Calculation (min) (ACUTE ONLY): 41 min  Charges:                        Vale Haven, SPT   Vale Haven 07/24/2020, 11:16 AM

## 2020-07-24 NOTE — Progress Notes (Signed)
[facility] [place] NAME: Stacie [name]    MR#:  [id number]  DATE OF BIRTH:  23-Feb-1956  SUBJECTIVE:  spoke with daughter in the room. Patient earlier worked with physical therapy.  Nibbling with lunch No trouble swallowing Family to keep log of PO intake to get better idea of intake/calorie count No respiratory distress.  REVIEW OF SYSTEMS:   Review of Systems  Constitutional: Positive for malaise/fatigue. Negative for chills, fever and weight loss.  HENT: Negative for ear discharge, ear pain and nosebleeds.   Eyes: Negative for blurred vision, pain and discharge.  Respiratory: Positive for wheezing. Negative for sputum production, shortness of breath and stridor.   Cardiovascular: Negative for chest pain, palpitations, orthopnea and PND.  Gastrointestinal: Negative for abdominal pain, diarrhea, nausea and vomiting.  Genitourinary: Negative for frequency and urgency.  Musculoskeletal: Negative for back pain and joint pain.  Neurological: Positive for weakness. Negative for sensory change, speech change and focal weakness.  Psychiatric/Behavioral: Negative for depression and hallucinations. The patient is not nervous/anxious.    Tolerating Diet:improving slowly Tolerating PT: recommending CIR  DRUG ALLERGIES:   Allergies  Allergen Reactions  . Nitrofurantoin Nausea Only  . Tramadol     Nausea and vomiting  . Sulfa Antibiotics Rash    As an infant    VITALS:  Blood pressure 130/60, pulse 90, temperature 98.2 F (36.8 C), resp. rate 19, height 5' 7.01" (1.702 m), weight 84 kg, SpO2 94 %.  PHYSICAL EXAMINATION:   Physical Exam  GENERAL:  64 y.o.-year-old patient lying in the bed with no acute distress.  HEENT: Head atraumatic, normocephalic. Oropharynx and nasopharynx clear.  NECK:  Supple, no jugular venous distention. No thyroid enlargement, no tenderness.  LUNGS: distant breath sounds bilaterally, no wheezing, rales,  rhonchi. No use of accessory muscles of respiration.  CARDIOVASCULAR: S1, S2 normal. No murmurs, rubs, or gallops.  ABDOMEN: Soft, nontender, nondistended. Bowel sounds present. No organomegaly or mass.  EXTREMITIES:      Cool to touch bilateral feet. Feeble pedal pulses.necrosis/dry gnagrene left 2/3 and 4th--stable NEUROLOGIC: cranial nerves grossly intact. No focal weakness. Generalized weakness/severe debility present  PSYCHIATRIC:  patient is alert and oriented x 2.  SKIN: as above  LABORATORY PANEL:  CBC Recent Labs  Lab 07/23/20 0413  WBC 19.1*  HGB 12.9  HCT 40.4  PLT 236    Chemistries  Recent Labs  Lab 07/21/20 0617 07/22/20 0304 07/24/20 0341  NA 141   < > 143  K 4.2   < > 4.3  CL 101   < > 101  CO2 29   < > 31  GLUCOSE 144*   < > 107*  BUN 41*   < > 38*  CREATININE 1.18*   < > 1.27*  CALCIUM 8.0*   < > 9.0  MG 1.8   < > 1.9  AST 98*  --   --   ALT 92*  --   --   ALKPHOS 71  --   --   BILITOT 0.8  --   --    < > = values in this interval not displayed.   Cardiac Enzymes No results for input(s): TROPONINI in the last 168 hours. RADIOLOGY:  No results found. ASSESSMENT AND PLAN:  Alyssia Heese is a 64 y.o. Female with a past medical history significant for pulmonary sarcoidosis (on 2L [place] at baseline), pulmonary hypertension, OSA, asthma, hypertension, hypothyroidism, IBS, and chronic kidney disease who  presents to [facility] ED on 07/09/20 due to altered mental status and shortness of breath.    Severe sepsis secondary to Klebsiella pneumonia bacteremia/UTI recurrent -repeat cultures negative -patient to be continued on Rocephin till 07/26/20--may need change depending on TEE results -appreciate ID input  Right kidney obstructive calculi and non- obstructing calculi of the left ureter -status post surgical decompression with stent placement on 07/14/20 -patient will follow-up with urology as outpatient for ureteral scopic, laser lithotripsy and stent  exchange -Foley now removed patient urinating well -oxybutinin for bladder spasms  Acute kidney injury in the setting of severe sepsis -patient had come in with creatinine of 4.5---1.18--1.2 -urine output adequate -appreciate nephrology input -pt need in and out cath for urine retention  Bilateral lower extremity ischemic foot-? Embolic -necrotic left second third toes with modeling discoloration of bilateral feet -HID panel negative -?dic related -vascular consultation with Dr. Lucky Cowboy-- recommends angiogram-- on hold per family request till patient more stable  Acute on chronic hypoxic respiratory failure secondary to acute exacerbation of pulmonary sarcoidosis/acute lung injury -she initially was placed on BiPAP then intubated and now extubated transferred out of ICU 10/ 5/21 -currently on 2 L nasal cannula oxygen. Patient uses chronically at home. -IV Solu-Medrol 20 mg Q day--d/w Dr Andree Moro po prednisone 40 mg --taper by 5 mg daily then ocnt 10 mg daily (steroid dependent) -- continue LABA/ICS: Arformoterol & Budesonide - Continue Treprostinil  - Continue home CPAP QHS -pt is on Methotrexate 25 mg qd at home --currently on hold  Acute metabolic encephalopathy-- improved -suspected due to gram-negative/Klebsiella severe sepsis source urine/Embolic CVA -continue supportive care  Acute CVA-- embolic in the setting of severe sepsis  -10/4--MRI brain showed acute subacute infarcts and bilateral frontal lobes likely embolic -TEE considered-- husband wants to hold since patient is improving -continue PT OT -continue aspirin, atorvastatin -neurology recommend subcu heparin TID. No systemic anticoagulation at this time. -TEE for Monday oct 11th by Dr Ubaldo Glassing  Oral candidiasis -IV Diflucan per Dr JR--ID  Nutrition Status: Nutrition Problem: Inadequate oral intake Etiology: decreased appetite, lethargy/confusion Signs/Symptoms: meal completion < 50% Interventions: Refer to RD  note for recommendations dietitian input appreciated. Recommends tube feeding due to poor nutritional status. Discussed with patient's daughter in the room and Dr. Kathyrn Sheriff. Will continue to encourage oral intake and consider tube feeding Monday once TEE is completed to avoid aspiration risk.  Family communication : daughter in the room. Consults : ID, pulmonary, vascular, podiatry CODE STATUS: full DVT Prophylaxis : heparin  Status is: Inpatient  Remains inpatient appropriate because:Inpatient level of care appropriate due to severity of illness   Dispo: The patient is from: Home              Anticipated d/c is to: CIR              Anticipated d/c date is: > 3 days              Patient currently is not medically stable to d/c.  Marland Kitchen She is working with physical therapy. She is continued on IV antibiotics. Appears to be malnourished and poor PO intake with multiple medical issues. Poor po intake. Will need TF most likley IV diflucan for oral candidiasis TEE planned for monday  TOTAL TIME TAKING CARE OF THIS PATIENT: 25 minutes.  >50% time spent on counselling and coordination of care  Note: This dictation was prepared with Dragon dictation along with smaller phrase technology. Any transcriptional errors that result from this  process are unintentional.  Fritzi Mandes M.D    [facility]   CC: Primary care physician; Glean Hess, MDPatient ID: Dawna Part, female   DOB: December 05, 1955, 64 y.o.   MRN: [id number]

## 2020-07-24 NOTE — Progress Notes (Signed)
Calorie Count Note  48 hour calorie count ordered. Day 1 results:  Diet: dysphagia 2 with thin liquids Supplements: Ensure Enlive po TID between meals (350 kcal and 20 grams of protein per supplement); Magic Cup po TID with meals (290 kcal and 9 grams of protein per supplement)  Lunch 10/7: 11 kcal, 0 grams of protein (bites of mashed potatoes) Dinner 10/7: 35 kcal, 3 grams of protein (3-4 bites of mashed potatoes with minced balsamic chicken and peas mixed in) Breakfast 10/8: 325 kcal, 15 grams of protein (75% cranberry juice, 66% eggs, bites potatoes, bites of Magic Cup, 100% milk) Supplements: N/A (0%)  Total intake Day 1: 371 kcal (18% of minimum estimated needs)  18 protein (17% of minimum estimated needs)  Estimated Nutritional Needs:  Kcal:  2050-2250 Protein:  105-115 grams Fluid:  >/= 2 L/day  Nutrition Dx: Inadequate oral intake related to decreased appetite, lethargy/confusion as evidenced by meal completion < 50%.  Goal: Patient will meet greater than or equal to 90% of their needs  Intervention:  -Continue 48 hour calorie count. Further results will be documented after the weekend. -Continue Ensure Enlive po TID, each supplement provides 350 kcal and 20 grams of protein. -Continue double protein with meals. -Continue Magic cup TID with meals, each supplement provides 290 kcal and 9 grams of protein. -Recommend placement of small-bore feeding tube (10 Fr. Dobbhoff tube) for initiation of tube feeds. Recommend:     -Initiate Jevity 1.5 Cal at 20 mL/hr and advance by 20 mL/hr every 12 hours to goal rate of 60 mL/hr     -PROSource TF 45 mL BID per tube     -Goal regimen provides 2240 kcal, 114 grams of protein, 1094 mL H2O daily -If tube feeds are started recommend minimum free water flush of 30 mL Q4hrs to maintain tube patency. Could increase as needed pending PO intake of fluids. -Monitor magnesium, potassium, and phosphorus daily for at least 3 days, MD to replete as  needed, as pt is at risk for refeeding syndrome given prolonged inadequate oral intake.  Jacklynn Barnacle, MS, RD, LDN Pager number available on Amion

## 2020-07-24 NOTE — Progress Notes (Signed)
[facility] for Electrolyte Monitoring and Replacement   Recent Labs: Potassium (mmol/L)  Date Value  07/24/2020 4.3  12/28/2012 3.4 (L)   Magnesium (mg/dL)  Date Value  07/24/2020 1.9   Calcium (mg/dL)  Date Value  07/24/2020 9.0   Calcium, Total (mg/dL)  Date Value  12/28/2012 8.0 (L)   Albumin (g/dL)  Date Value  07/21/2020 2.8 (L)  05/27/2019 4.1  12/26/2012 3.0 (L)   Phosphorus (mg/dL)  Date Value  07/24/2020 3.9   Sodium (mmol/L)  Date Value  07/24/2020 143  05/27/2019 144  12/28/2012 142   Corrected Ca: 8.96 mg/dL  Assessment: Pharmacy consulted to replace electrolytes in this 64 year old female admitted with K pneumoniae bacteremia. Pt with PMH of Stage 3 CKD and baseline Scr of 1.0 in May 2021. Nephrology is following. Electrolytes are WNL today.  MIVF: 5% dextrose at 40 mL/hr  Goal of Therapy:  Electrolytes WNL   Plan:   No electrolyte replacement needed today.   F/u electrolytes with am labs  Rowland Lathe, PharmD 07/24/2020 7:22 AM

## 2020-07-24 NOTE — Progress Notes (Signed)
Date of Admission:  07/09/2020   Total days of antibiotics 15  ID: Stacie [name] is a 64 y.o. female Active Problems:   Sepsis ([place])   Klebsiella pneumoniae sepsis ([place])   Critical lower limb ischemia ([place])   Acute renal failure ([place])   Cerebrovascular accident (CVA) due to embolism of cerebral artery ([place])    Subjective: Says she worked with PT No diarrhea Throat feels better  Medications:  . arformoterol  15 mcg Nebulization BID  . aspirin EC  81 mg Oral Daily  . atorvastatin  40 mg Oral Daily  . budesonide (PULMICORT) nebulizer solution  0.25 mg Nebulization BID  . Chlorhexidine Gluconate Cloth  6 each Topical Q0600  . docusate  100 mg Oral BID  . dorzolamide  1 drop Both Eyes BID  . escitalopram  20 mg Oral Daily  . feeding supplement (ENSURE ENLIVE)  237 mL Oral TID BM  . fluconazole  100 mg Oral Daily  . heparin injection (subcutaneous)  5,000 Units Subcutaneous Q8H  . insulin aspart  0-15 Units Subcutaneous TID AC & HS  . latanoprost  1 drop Right Eye QHS  . levothyroxine  137 mcg Oral Q0600  . multivitamin with minerals  1 tablet Oral Daily  . nystatin  1 application Topical TID  . oxybutynin  5 mg Oral QHS  . pantoprazole  40 mg Oral Daily  . polyethylene glycol  17 g Oral Daily  . [START ON 07/25/2020] predniSONE  35 mg Oral Once   Followed by  . [START ON 07/26/2020] predniSONE  30 mg Oral Once   Followed by  . [START ON 07/27/2020] predniSONE  25 mg Oral Once   Followed by  . [START ON 07/28/2020] predniSONE  20 mg Oral Once   Followed by  . [START ON 07/29/2020] predniSONE  15 mg Oral Once   Followed by  . [START ON 07/30/2020] predniSONE  10 mg Oral Q breakfast  . thiamine injection  100 mg Intravenous Daily  . Treprostinil  18 mcg Inhalation QID    Objective: Vital signs in last 24 hours: Temp:  [97.6 F (36.4 C)-98.5 F (36.9 C)] 98.2 F (36.8 C) (10/08 0743) Pulse Rate:  [87-94] 90 (10/08 0743) Resp:  [14-20] 19 (10/08 0743) BP:  (124-144)/(46-77) 130/60 (10/08 0743) SpO2:  [94 %-97 %] 94 % (10/08 0743) Weight:  [84 kg] 84 kg (10/08 0500)  Patient Vitals for the past 24 hrs:  BP Temp Temp src Pulse Resp SpO2 Weight  07/24/20 0743 130/60 98.2 F (36.8 C) - 90 19 94 % -  07/24/20 0500 - - - - - - 84 kg  07/24/20 0407 (!) 124/46 98.5 F (36.9 C) Oral 87 18 96 % -  07/23/20 2348 (!) 141/77 97.6 F (36.4 C) Oral 88 18 97 % -  07/23/20 2117 - - - - - 97 % -  07/23/20 1951 140/61 97.9 F (36.6 C) Oral 94 14 96 % -  07/23/20 1632 (!) 144/66 98.1 F (36.7 C) Oral 89 20 96 % -    PHYSICAL EXAM:  General: Alert, cooperative, no distress, some tachypnea Head: Normocephalic, without obvious abnormality, atraumatic. Eyes: Conjunctivae clear, anicteric sclerae. Pupils are equal ENT Nares normal. No drainage or sinus tenderness. Lips, mucosa, and tongue normal. Oral white patches much better Neck: Supple, symmetrical, no adenopathy, thyroid: non tender no carotid bruit and no JVD. Back: No CVA tenderness. Lungs: b/l air entry- crepts present Heart: Regular rate and rhythm,  no murmur, rub or gallop. Abdomen: Soft, non-tender,not distended. Bowel sounds normal. No masses Extremities: dry gangrene left feet, rt toes Skin: No rashes or lesions. Or bruising Lymph: Cervical, supraclavicular normal. Neurologic: Grossly non-focal  Lab Results Recent Labs    07/22/20 0304 07/22/20 0304 07/23/20 0413 07/24/20 0341  WBC 21.6*  --  19.1*  --   HGB 12.8  --  12.9  --   HCT 40.9  --  40.4  --   NA 143   < > 143 143  K 4.5   < > 3.7 4.3  CL 102   < > 99 101  CO2 28   < > 32 31  BUN 40*   < > 38* 38*  CREATININE 1.18*   < > 1.20* 1.27*   < > = values in this interval not displayed.    Microbiology: 07/09/20- Urine culture kleb pneumo 07/09/20- BC- kleb pneumo 07/16/20- NG  Studies/Results: No results found.   Assessment/Plan:  Septic shock secondary to klebsiella bacteremia - resolved Klebsiella bacteremia due  to complicated UTI On ceftriaxone .Marland Kitchen Continue until TEE is done and then decide on furtherr antibiotic  CVA- b/l frontal lobe acute/subacute infarct likely embolic Minimal residual deficit With question of emboli, b/l feet gangrene TEE is recommended to r/o mural thrombus/vegetation Discussed with neurologist  Cardiologist seen the patient - plan is for TEE on monday  Complicated urinary tract infection with b/l ureteric stone and rt causing obstruction s/p b/l stent placement on 07/15/20  Oropharyngeal candidiasis. Minimal response to nystatin-  Changed to Fluconazole  Dry gangrene feet. Left > rt No intervention currently- vascular  Following  Leucocytosis- likely related to gangrene  2 loose stools yesterday following Miralax, docusate- cdiff testing not required- stop laxatives - normal stool today  Discussed the management with care team ID will follow her peripherally this weekend- call if needed

## 2020-07-25 LAB — BASIC METABOLIC PANEL
Anion gap: 9 (ref 5–15)
BUN: 42 mg/dL — ABNORMAL HIGH (ref 8–23)
CO2: 32 mmol/L (ref 22–32)
Calcium: 8.9 mg/dL (ref 8.9–10.3)
Chloride: 101 mmol/L (ref 98–111)
Creatinine, Ser: 1.23 mg/dL — ABNORMAL HIGH (ref 0.44–1.00)
GFR, Estimated: 46 mL/min — ABNORMAL LOW (ref >60)
Glucose, Bld: 111 mg/dL — ABNORMAL HIGH (ref 70–99)
Potassium: 4.1 mmol/L (ref 3.5–5.1)
Sodium: 142 mmol/L (ref 135–145)

## 2020-07-25 LAB — GLUCOSE, CAPILLARY
Glucose-Capillary: 93 mg/dL (ref 70–99)
Glucose-Capillary: 164 mg/dL — ABNORMAL HIGH (ref 70–99)
Glucose-Capillary: 203 mg/dL — ABNORMAL HIGH (ref 70–99)
Glucose-Capillary: 230 mg/dL — ABNORMAL HIGH (ref 70–99)

## 2020-07-25 LAB — PHOSPHORUS: Phosphorus: 3.8 mg/dL (ref 2.5–4.6)

## 2020-07-25 LAB — MAGNESIUM: Magnesium: 2 mg/dL (ref 1.7–2.4)

## 2020-07-25 MED ORDER — TAMSULOSIN HCL 0.4 MG PO CAPS
0.4000 mg | ORAL_CAPSULE | Freq: Every day | ORAL | Status: DC
  Administered 2020-07-25 – 2020-07-28 (×4): 0.4 mg via ORAL
  Filled 2020-07-25 (×4): qty 1

## 2020-07-25 MED ORDER — PREDNISONE 10 MG PO TABS: 15.0000 mg | ORAL_TABLET | Freq: Every day | ORAL | Status: DC

## 2020-07-25 NOTE — Progress Notes (Signed)
[facility]  ROUNDING NOTE   Subjective:   Husband at bedside.  Patient working with therapy.   Requiring in and out catheterizations.   Objective:  Vital signs in last 24 hours:  Temp:  [97.6 F (36.4 C)-98.2 F (36.8 C)] 98 F (36.7 C) (10/09 1129) Pulse Rate:  [81-98] 89 (10/09 1129) Resp:  [17-20] 18 (10/09 1129) BP: (118-152)/(47-79) 129/51 (10/09 1129) SpO2:  [90 %-100 %] 100 % (10/09 1129) Weight:  [82 kg] 82 kg (10/09 0500)  Weight change: -1.99 kg Filed Weights   07/23/20 0500 07/24/20 0500 07/25/20 0500  Weight: 85.1 kg 84 kg 82 kg    Intake/Output: I/O last 3 completed shifts: In: 1175.6 [P.O.:580; I.V.:545.6; IV Piggyback:50] Out: 3338 [Urine:350; Other:1500]   Intake/Output this shift:  No intake/output data recorded.  Physical Exam: General: NAD, laying in bed  Head: Normocephalic, atraumatic. Moist oral mucosal membranes  Eyes: Anicteric, PERRL  Neck: Supple, trachea midline  Lungs:  Clear to auscultation  Heart: Regular rate and rhythm  Abdomen:  Soft, nontender,   Extremities:  no peripheral edema.  Neurologic: Nonfocal, moving all four extremities  Skin: No lesions         Basic Metabolic Panel: Recent Labs  Lab 07/21/20 0617 07/21/20 0617 07/22/20 0304 07/22/20 0304 07/23/20 0413 07/24/20 0341 07/25/20 0432  NA 141  --  143  --  143 143 142  K 4.2  --  4.5  --  3.7 4.3 4.1  CL 101  --  102  --  99 101 101  CO2 29  --  28  --  32 31 32  GLUCOSE 144*  --  152*  --  108* 107* 111*  BUN 41*  --  40*  --  38* 38* 42*  CREATININE 1.18*  --  1.18*  --  1.20* 1.27* 1.23*  CALCIUM 8.0*   < > 8.4*   < > 8.8* 9.0 8.9  MG 1.8  --  1.7  --  2.1 1.9 2.0  PHOS 4.2  --  4.0  --  3.5 3.9 3.8   < > = values in this interval not displayed.    Liver Function Tests: Recent Labs  Lab 07/21/20 0617  AST 98*  ALT 92*  ALKPHOS 71  BILITOT 0.8  PROT 5.9*  ALBUMIN 2.8*   No results for input(s): LIPASE, AMYLASE in the last 168  hours. No results for input(s): AMMONIA in the last 168 hours.  CBC: Recent Labs  Lab 07/19/20 0530 07/19/20 0530 07/20/20 0415 07/21/20 0617 07/21/20 1154 07/22/20 0304 07/23/20 0413  WBC 26.1*   < > 22.3* 20.6* 22.8* 21.6* 19.1*  NEUTROABS 22.2*  --  18.9* 17.1*  --  18.5* 15.8*  HGB 13.6   < > 14.4 13.5 13.4 12.8 12.9  HCT 43.9   < > 45.0 43.0 43.4 40.9 40.4  MCV 100.2*   < > 96.4 99.3 99.5 97.4 96.4  PLT 97*   < > 139* 159 172 198 236   < > = values in this interval not displayed.    Cardiac Enzymes: No results for input(s): CKTOTAL, CKMB, CKMBINDEX, TROPONINI in the last 168 hours.  BNP: Invalid input(s): POCBNP  CBG: Recent Labs  Lab 07/24/20 1121 07/24/20 1601 07/24/20 2119 07/25/20 0812 07/25/20 1131  GLUCAP 181* 268* 212* 93 164*    Microbiology: Results for orders placed or performed during the hospital encounter of 07/09/20  Urine culture     Status: Abnormal  Collection Time: 07/09/20 11:27 PM   Specimen: In/Out Cath Urine  Result Value Ref Range Status   Specimen Description   Final    IN/OUT CATH URINE Performed at [facility], [address] [place], [place] [address]    Special Requests   Final    NONE Performed at [facility], [place], [place] [address]    Culture >=100,000 COLONIES/mL KLEBSIELLA PNEUMONIAE (A)  Final   Report Status 07/12/2020 FINAL  Final   Organism ID, Bacteria KLEBSIELLA PNEUMONIAE (A)  Final      Susceptibility   Klebsiella pneumoniae - MIC*    AMPICILLIN RESISTANT Resistant     CEFAZOLIN <=4 SENSITIVE Sensitive     CEFTRIAXONE <=0.25 SENSITIVE Sensitive     CIPROFLOXACIN <=0.25 SENSITIVE Sensitive     GENTAMICIN <=1 SENSITIVE Sensitive     IMIPENEM <=0.25 SENSITIVE Sensitive     NITROFURANTOIN <=16 SENSITIVE Sensitive     TRIMETH/SULFA <=20 SENSITIVE Sensitive     AMPICILLIN/SULBACTAM <=2 SENSITIVE Sensitive     PIP/TAZO <=4 SENSITIVE Sensitive     * >=100,000  COLONIES/mL KLEBSIELLA PNEUMONIAE  Respiratory Panel by RT PCR (Flu A&B, Covid) - Nasopharyngeal Swab     Status: None   Collection Time: 07/09/20 11:28 PM   Specimen: Nasopharyngeal Swab  Result Value Ref Range Status   SARS Coronavirus 2 by RT PCR NEGATIVE NEGATIVE Final    Comment: (NOTE) SARS-CoV-2 target nucleic acids are NOT DETECTED.  The SARS-CoV-2 RNA is generally detectable in upper respiratoy specimens during the acute phase of infection. The lowest concentration of SARS-CoV-2 viral copies this assay can detect is 131 copies/mL. A negative result does not preclude SARS-Cov-2 infection and should not be used as the sole basis for treatment or other patient management decisions. A negative result may occur with  improper specimen collection/handling, submission of specimen other than nasopharyngeal swab, presence of viral mutation(s) within the areas targeted by this assay, and inadequate number of viral copies (<131 copies/mL). A negative result must be combined with clinical observations, patient history, and epidemiological information. The expected result is Negative.  Fact Sheet for Patients:  PinkCheek.be  Fact Sheet for Healthcare Providers:  GravelBags.it  This test is no t yet approved or cleared by the [place] FDA and  has been authorized for detection and/or diagnosis of SARS-CoV-2 by FDA under an Emergency Use Authorization (EUA). This EUA will remain  in effect (meaning this test can be used) for the duration of the COVID-19 declaration under Section 564(b)(1) of the Act, 21 U.S.C. section 360bbb-3(b)(1), unless the authorization is terminated or revoked sooner.     Influenza A by PCR NEGATIVE NEGATIVE Final   Influenza B by PCR NEGATIVE NEGATIVE Final    Comment: (NOTE) The Xpert Xpress SARS-CoV-2/FLU/RSV assay is intended as an aid in  the diagnosis of influenza from Nasopharyngeal swab  specimens and  should not be used as a sole basis for treatment. Nasal washings and  aspirates are unacceptable for Xpert Xpress SARS-CoV-2/FLU/RSV  testing.  Fact Sheet for Patients: PinkCheek.be  Fact Sheet for Healthcare Providers: GravelBags.it  This test is not yet approved or cleared by the [place] FDA and  has been authorized for detection and/or diagnosis of SARS-CoV-2 by  FDA under an Emergency Use Authorization (EUA). This EUA will remain  in effect (meaning this test can be used) for the duration of the  Covid-19 declaration under Section 564(b)(1) of the Act, 21  U.S.C. section  360bbb-3(b)(1), unless the authorization is  terminated or revoked. Performed at [facility], [address] [place], [place] [address]   Blood Culture (routine x 2)     Status: Abnormal   Collection Time: 07/09/20 11:36 PM   Specimen: BLOOD  Result Value Ref Range Status   Specimen Description   Final    BLOOD RIGHT ANTECUBITAL Performed at [facility], [address] [place], [place] [address]    Special Requests   Final    BOTTLES DRAWN AEROBIC AND ANAEROBIC Blood Culture adequate volume Performed at [facility], [address] [place], [place] [address]    Culture  Setup Time   Final    Organism ID to follow GRAM NEGATIVE RODS IN BOTH AEROBIC AND ANAEROBIC BOTTLES CRITICAL RESULT CALLED TO, READ BACK BY AND VERIFIED WITH: AMY THOMPSON AT 1220 07/10/20 SDR Performed at [facility], [address] [place], [place] [address]    Culture KLEBSIELLA PNEUMONIAE (A)  Final   Report Status 07/12/2020 FINAL  Final   Organism ID, Bacteria KLEBSIELLA PNEUMONIAE  Final      Susceptibility   Klebsiella pneumoniae - MIC*    AMPICILLIN RESISTANT Resistant     CEFAZOLIN <=4 SENSITIVE Sensitive     CEFEPIME <=0.12 SENSITIVE Sensitive     CEFTAZIDIME <=1 SENSITIVE Sensitive      CEFTRIAXONE <=0.25 SENSITIVE Sensitive     CIPROFLOXACIN <=0.25 SENSITIVE Sensitive     GENTAMICIN <=1 SENSITIVE Sensitive     IMIPENEM <=0.25 SENSITIVE Sensitive     TRIMETH/SULFA <=20 SENSITIVE Sensitive     AMPICILLIN/SULBACTAM <=2 SENSITIVE Sensitive     PIP/TAZO <=4 SENSITIVE Sensitive     * KLEBSIELLA PNEUMONIAE  Blood Culture ID Panel (Reflexed)     Status: Abnormal   Collection Time: 07/09/20 11:36 PM  Result Value Ref Range Status   Enterococcus faecalis NOT DETECTED NOT DETECTED Final   Enterococcus Faecium NOT DETECTED NOT DETECTED Final   Listeria monocytogenes NOT DETECTED NOT DETECTED Final   Staphylococcus species NOT DETECTED NOT DETECTED Final   Staphylococcus aureus (BCID) NOT DETECTED NOT DETECTED Final   Staphylococcus epidermidis NOT DETECTED NOT DETECTED Final   Staphylococcus lugdunensis NOT DETECTED NOT DETECTED Final   Streptococcus species NOT DETECTED NOT DETECTED Final   Streptococcus agalactiae NOT DETECTED NOT DETECTED Final   Streptococcus pneumoniae NOT DETECTED NOT DETECTED Final   Streptococcus pyogenes NOT DETECTED NOT DETECTED Final   A.calcoaceticus-baumannii NOT DETECTED NOT DETECTED Final   Bacteroides fragilis NOT DETECTED NOT DETECTED Final   Enterobacterales DETECTED (A) NOT DETECTED Final    Comment: Enterobacterales represent a large order of gram negative bacteria, not a single organism. CRITICAL RESULT CALLED TO, READ BACK BY AND VERIFIED WITH:  AMY THOMPSON AT 4492 04/09/20 SDR    Enterobacter cloacae complex NOT DETECTED NOT DETECTED Final   Escherichia coli NOT DETECTED NOT DETECTED Final   Klebsiella aerogenes NOT DETECTED NOT DETECTED Final   Klebsiella oxytoca NOT DETECTED NOT DETECTED Final   Klebsiella pneumoniae DETECTED (A) NOT DETECTED Final    Comment: CRITICAL RESULT CALLED TO, READ BACK BY AND VERIFIED WITH:  AMY THOMPSON AT 1220 07/10/20 SDR    Proteus species NOT DETECTED NOT DETECTED Final   Salmonella species NOT  DETECTED NOT DETECTED Final   Serratia marcescens NOT DETECTED NOT DETECTED Final   Haemophilus influenzae NOT DETECTED NOT DETECTED Final   Neisseria meningitidis NOT DETECTED NOT DETECTED Final   Pseudomonas aeruginosa NOT DETECTED NOT  DETECTED Final   Stenotrophomonas maltophilia NOT DETECTED NOT DETECTED Final   Candida albicans NOT DETECTED NOT DETECTED Final   Candida auris NOT DETECTED NOT DETECTED Final   Candida glabrata NOT DETECTED NOT DETECTED Final   Candida krusei NOT DETECTED NOT DETECTED Final   Candida parapsilosis NOT DETECTED NOT DETECTED Final   Candida tropicalis NOT DETECTED NOT DETECTED Final   Cryptococcus neoformans/gattii NOT DETECTED NOT DETECTED Final   CTX-M ESBL NOT DETECTED NOT DETECTED Final   Carbapenem resistance IMP NOT DETECTED NOT DETECTED Final   Carbapenem resistance KPC NOT DETECTED NOT DETECTED Final   Carbapenem resistance NDM NOT DETECTED NOT DETECTED Final   Carbapenem resist OXA 48 LIKE NOT DETECTED NOT DETECTED Final   Carbapenem resistance VIM NOT DETECTED NOT DETECTED Final    Comment: Performed at [facility], [address] [place], [place] [address]  Blood Culture (routine x 2)     Status: Abnormal   Collection Time: 07/09/20 11:37 PM   Specimen: BLOOD  Result Value Ref Range Status   Specimen Description   Final    BLOOD BLOOD RIGHT FOREARM Performed at [facility], [address] [place], [place] [address]    Special Requests   Final    BOTTLES DRAWN AEROBIC AND ANAEROBIC Blood Culture adequate volume Performed at [facility], [address] [place], [place] [address]    Culture  Setup Time   Final    GRAM NEGATIVE RODS IN BOTH AEROBIC AND ANAEROBIC BOTTLES CRITICAL RESULT CALLED TO, READ BACK BY AND VERIFIED WITH: AMY THOMPSON AT 1220 07/10/20 SDR Performed at [facility], [address] [place], [place] [address]    Culture (A)  Final    KLEBSIELLA PNEUMONIAE SUSCEPTIBILITIES  PERFORMED ON PREVIOUS CULTURE WITHIN THE LAST 5 DAYS. Performed at [facility], [address] [place], [place] [address]    Report Status 07/12/2020 FINAL  Final  Aspergillus Ag, BAL/Serum     Status: None   Collection Time: 07/10/20  5:04 AM   Specimen: Blood  Result Value Ref Range Status   Aspergillus Ag, BAL/Serum 0.03 0.00 - 0.49 Index Final    Comment: (NOTE) Performed At: [facility] [place], [place] [id number] Rush Farmer MD DD:[id number]   MRSA PCR Screening     Status: None   Collection Time: 07/10/20 12:54 PM   Specimen: Nasal Mucosa; Nasopharyngeal  Result Value Ref Range Status   MRSA by PCR NEGATIVE NEGATIVE Final    Comment:        The GeneXpert MRSA Assay (FDA approved for NASAL specimens only), is one component of a comprehensive MRSA colonization surveillance program. It is not intended to diagnose MRSA infection nor to guide or monitor treatment for MRSA infections. Performed at [facility], [address] [place], [place] [address]   Culture, blood (Routine X 2) w Reflex to ID Panel     Status: None   Collection Time: 07/16/20  1:29 PM   Specimen: BLOOD  Result Value Ref Range Status   Specimen Description BLOOD LEFT ANTECUBITAL  Final   Special Requests   Final    AEROBIC BOTTLE ONLY Blood Culture results may not be optimal due to an inadequate volume of blood received in culture bottles   Culture   Final    NO GROWTH 5 DAYS Performed at [facility], [address] [place], [place] [address]    Report Status 07/21/2020 FINAL  Final  Culture, blood (Routine X 2) w Reflex to ID Panel     Status: None   Collection Time: 07/16/20  5:09 PM   Specimen: BLOOD  Result Value Ref Range Status   Specimen Description BLOOD BLOOD RIGHT HAND  Final   Special Requests   Final    BOTTLES DRAWN AEROBIC ONLY Blood Culture results may not be optimal due to an inadequate volume of blood received in culture  bottles   Culture   Final    NO GROWTH 5 DAYS Performed at [facility], [address], [place], [place] [address]    Report Status 07/21/2020 FINAL  Final    Coagulation Studies: No results for input(s): LABPROT, INR in the last 72 hours.  Urinalysis: No results for input(s): COLORURINE, LABSPEC, PHURINE, GLUCOSEU, HGBUR, BILIRUBINUR, KETONESUR, PROTEINUR, UROBILINOGEN, NITRITE, LEUKOCYTESUR in the last 72 hours.  Invalid input(s): APPERANCEUR    Imaging: No results found.   Medications:   . sodium chloride 20 mL/hr at 07/23/20 2027  . cefTRIAXone (ROCEPHIN)  IV 2 g (07/24/20 1733)  . fluconazole (DIFLUCAN) IV 100 mg (07/24/20 1343)   . arformoterol  15 mcg Nebulization BID  . aspirin EC  81 mg Oral Daily  . atorvastatin  40 mg Oral Daily  . budesonide (PULMICORT) nebulizer solution  0.25 mg Nebulization BID  . Chlorhexidine Gluconate Cloth  6 each Topical Q0600  . docusate  100 mg Oral BID  . dorzolamide  1 drop Both Eyes BID  . escitalopram  20 mg Oral Daily  . feeding supplement (ENSURE ENLIVE)  237 mL Oral TID BM  . heparin injection (subcutaneous)  5,000 Units Subcutaneous Q8H  . insulin aspart  0-15 Units Subcutaneous TID AC & HS  . latanoprost  1 drop Right Eye QHS  . levothyroxine  137 mcg Oral Q0600  . multivitamin with minerals  1 tablet Oral Daily  . nystatin  1 application Topical TID  . pantoprazole  40 mg Oral Daily  . [START ON 07/26/2020] predniSONE  30 mg Oral Once   Followed by  . [START ON 07/27/2020] predniSONE  25 mg Oral Once   Followed by  . [START ON 07/28/2020] predniSONE  20 mg Oral Once   Followed by  . [START ON 07/29/2020] predniSONE  15 mg Oral Once   Followed by  . [START ON 07/30/2020] predniSONE  15 mg Oral Q breakfast  . tamsulosin  0.4 mg Oral Daily  . thiamine injection  100 mg Intravenous Daily  . Treprostinil  18 mcg Inhalation QID   acetaminophen, bisacodyl, docusate sodium, polyethylene glycol  Assessment/  Plan:  Stacie [name] is a 64 y.o. white female with sarcoidosis, interstitial lung disease, obstructive sleep apnea, depression, GERD, hyperlipidemia, hypothyroidism   admitted on 07/09/2020 for Klebsiella sepsis and urinary tract infection. Patient underwent cystoscopy with bilateral ureteral stents placed by Dr. Diamantina Providence on 9/28.   1. Acute kidney injury: baseline creatinine of 1, GFR >60 in 02/2020.  Creatinine trending down.   2. Urinary tract infection and sepsis:  - ceftriaxone.   3. Hypercalcemia: due to sepsis, patient's calcium is low. However due to underlying sarcoidosis, will monitor.   Will sign off. Please call with questions.     LOS: [place] 10/9/20211:10 PM

## 2020-07-25 NOTE — Progress Notes (Addendum)
[facility] NAME: Cheyne Bungert    MR#:  [id number]  DATE OF BIRTH:  Feb 20, 1956  SUBJECTIVE:  spoke with daughter and Dr Kathyrn Sheriff in the room. Ate good amount of biscuit and gravy brought by family in am  No trouble swallowing patient requiring intermittent in and out cath secondary to urinary retention. No respiratory distress.  REVIEW OF SYSTEMS:   Review of Systems  Constitutional: Positive for malaise/fatigue. Negative for chills, fever and weight loss.  HENT: Negative for ear discharge, ear pain and nosebleeds.   Eyes: Negative for blurred vision, pain and discharge.  Respiratory: Positive for wheezing. Negative for sputum production, shortness of breath and stridor.   Cardiovascular: Negative for chest pain, palpitations, orthopnea and PND.  Gastrointestinal: Negative for abdominal pain, diarrhea, nausea and vomiting.  Genitourinary: Negative for frequency and urgency.  Musculoskeletal: Negative for back pain and joint pain.  Neurological: Positive for weakness. Negative for sensory change, speech change and focal weakness.  Psychiatric/Behavioral: Negative for depression and hallucinations. The patient is not nervous/anxious.    Tolerating Diet:improving slowly Tolerating PT: recommending CIR  DRUG ALLERGIES:   Allergies  Allergen Reactions  . Nitrofurantoin Nausea Only  . Tramadol     Nausea and vomiting  . Sulfa Antibiotics Rash    As an infant    VITALS:  Blood pressure (!) 129/51, pulse 89, temperature 98 F (36.7 C), temperature source Oral, resp. rate 18, height 5' 7.01" (1.702 m), weight 82 kg, SpO2 100 %.  PHYSICAL EXAMINATION:   Physical Exam  GENERAL:  64 y.o.-year-old patient lying in the bed with no acute distress.  HEENT: Head atraumatic, normocephalic. Oropharynx and nasopharynx clear.  NECK:  Supple, no jugular venous distention. No thyroid enlargement, no tenderness.  LUNGS: distant breath  sounds bilaterally, no wheezing, rales, rhonchi. No use of accessory muscles of respiration.  CARDIOVASCULAR: S1, S2 normal. No murmurs, rubs, or gallops.  ABDOMEN: Soft, nontender, nondistended. Bowel sounds present. No organomegaly or mass.  EXTREMITIES:        Cool to touch bilateral feet. good pedal pulses.necrosis/dry gangrene left 2/3 and 4th--stable NEUROLOGIC: cranial nerves grossly intact. No focal weakness. Generalized weakness/severe debility present  PSYCHIATRIC:  patient is alert and oriented x 2.  SKIN: as above  LABORATORY PANEL:  CBC Recent Labs  Lab 07/23/20 0413  WBC 19.1*  HGB 12.9  HCT 40.4  PLT 236    Chemistries  Recent Labs  Lab 07/21/20 0617 07/22/20 0304 07/25/20 0432  NA 141   < > 142  K 4.2   < > 4.1  CL 101   < > 101  CO2 29   < > 32  GLUCOSE 144*   < > 111*  BUN 41*   < > 42*  CREATININE 1.18*   < > 1.23*  CALCIUM 8.0*   < > 8.9  MG 1.8   < > 2.0  AST 98*  --   --   ALT 92*  --   --   ALKPHOS 71  --   --   BILITOT 0.8  --   --    < > = values in this interval not displayed.   Cardiac Enzymes No results for input(s): TROPONINI in the last 168 hours. RADIOLOGY:  No results found. ASSESSMENT AND PLAN:  Vanilla Heatherington is a 64 y.o. Female with a past medical history significant for pulmonary sarcoidosis (on 2L [place] at baseline), pulmonary hypertension, OSA,  asthma, hypertension, hypothyroidism, IBS, and chronic kidney disease who presents to [facility] ED on 07/09/20 due to altered mental status and shortness of breath.    Severe sepsis secondary to Klebsiella pneumonia bacteremia/UTI recurrent -repeat cultures negative -patient to be continued on Rocephin till 07/26/20--may need change depending on TEE results -appreciate ID input  Right kidney obstructive calculi and non- obstructing calculi of the left ureter Intermittent Urinary retention -status post surgical decompression with stent placement on 07/14/20 -patient will follow-up with  urology as outpatient for ureteral scopic, laser lithotripsy and stent exchange -Foley now removed patient urinating well -10/8--d/c oxybutinin. Start Flomax for intermittent urine retention requiring in and out cath. Encourage BSC  Acute kidney injury in the setting of severe sepsis -patient had come in with creatinine of 4.5---1.18--1.2 -urine output adequate -appreciate nephrology input  Bilateral lower extremity ischemic foot-? Embolic -necrotic left second third toes with modeling discoloration of bilateral feet -HID panel negative -?dic related -vascular consultation with Dr. Lucky Cowboy-- recommends angiogram-- on hold per family request till patient more stable. Good pedal pulses  Acute on chronic hypoxic respiratory failure secondary to acute exacerbation of pulmonary sarcoidosis/acute lung injury -she initially was placed on BiPAP then intubated and now extubated transferred out of ICU 10/ 5/21 -currently on 2 L nasal cannula oxygen. Patient uses chronically at home. -IV Solu-Medrol 20 mg Q day--d/w Dr Andree Moro po prednisone 40 mg --taper by 5 mg daily then cont 15 mg daily (steroid dependent) -- continue LABA/ICS: Arformoterol & Budesonide - Continue Treprostinil  - Continue home CPAP QHS -pt is on Methotrexate 25 mg qd at home --currently on hold  Acute metabolic encephalopathy-- improved -suspected due to gram-negative/Klebsiella severe sepsis source urine/Embolic CVA -continue supportive care  Acute CVA-- embolic in the setting of severe sepsis  -10/4--MRI brain showed acute subacute infarcts and bilateral frontal lobes likely embolic -TEE considered-- husband wants to hold since patient is improving -continue PT OT -continue aspirin, atorvastatin -neurology recommend subcu heparin TID. No systemic anticoagulation at this time. -TEE for Monday oct 11th by Dr Ubaldo Glassing  Oral candidiasis -IV Diflucan per Dr JR--ID -will treat for 7 days  Prediabetes/Hyperglycemia -A1c  07/16/20 was 6.4% -cont ssi -sugars remaining <200  Nutrition Status: Nutrition Problem: Inadequate oral intake Etiology: decreased appetite, lethargy/confusion Signs/Symptoms: meal completion < 50% Interventions: Refer to RD note for recommendations dietitian input appreciated. Recommends tube feeding due to poor nutritional status. Discussed with patient's daughter in the room and Dr. Kathyrn Sheriff. Will continue to encourage oral intake and consider tube feeding Monday depending on po intake/calorie count once TEE is completed to avoid aspiration risk.  Family communication : daughter/Dr Casebeer in the room. Consults : ID, pulmonary, vascular, podiatry CODE STATUS: full DVT Prophylaxis : heparin  Status is: Inpatient  Remains inpatient appropriate because:Inpatient level of care appropriate due to severity of illness   Dispo: The patient is from: Home              Anticipated d/c is to: CIR              Anticipated d/c date is: > 3 days              Patient currently is not medically stable to d/c.  Marland Kitchen She is working with physical therapy. She is continued on IV antibiotics. Appears to be malnourished and poor PO intake with multiple medical issues. Poor po intake. Will need TF most likley IV diflucan for oral candidiasis TEE planned for monday  TOTAL  TIME TAKING CARE OF THIS PATIENT: 25 minutes.  >50% time spent on counselling and coordination of care  Note: This dictation was prepared with Dragon dictation along with smaller phrase technology. Any transcriptional errors that result from this process are unintentional.  Fritzi Mandes M.D    [facility]   CC: Primary care physician; Glean Hess, MDPatient ID: Dawna Part, female   DOB: [birth year], 64 y.o.   MRN: [id number]

## 2020-07-25 NOTE — Progress Notes (Signed)
PODIATRY / FOOT AND ANKLE SURGERY PROGRESS NOTE  Requesting Physician: Olga Millers NP  Reason for consult: Necrosis both forefeet  Chief Complaint: Dry gangrene of both feet   HPI: Stacie [name] is a 64 y.o. female who presents resting in bed comfortably.  Family at bedside today.  They have seen some improvement in her feet as her feet do feel warmer but she has some skin changes still that are darker and concerning for dry gangrene.  There is also noticed a new area of blistering on the dorsal aspect the left foot.  Patient more communicative today.  PMHx:  Past Medical History:  Diagnosis Date  . Arrhythmia    patient unaware if this is current  . Asthma   . GERD (gastroesophageal reflux disease)   . Heart murmur   . History of kidney stones   . HOH (hard of hearing)    wear aids  . Hypothyroidism   . IBS (irritable bowel syndrome)   . Pulmonary hypertension ([place])   . Sarcoid   . Sarcoidosis   . Seasonal allergies   . Sleep apnea CPAP with O2  . Wears hearing aid in both ears     Surgical Hx:  Past Surgical History:  Procedure Laterality Date  . CARDIAC CATHETERIZATION  10/18/2018   [facility]  . CATARACT EXTRACTION W/PHACO Left 07/31/2019   Procedure: CATARACT EXTRACTION PHACO AND INTRAOCULAR LENS PLACEMENT (IOC) LEFT 00:51.1  17.9%  9.15;  Surgeon: Leandrew Koyanagi, MD;  Location: [facility];  Service: Ophthalmology;  Laterality: Left;  keep this patient second  . COLON SURGERY     "colon was fused to bladder - operated on both"  . COLONOSCOPY  09/18/2007   diverticuli, no polyps  . COLONOSCOPY  05/26/2010   diverticuli, no polyps  . CYSTOSCOPY WITH STENT PLACEMENT Bilateral 07/14/2020   Procedure: CYSTOSCOPY WITH STENT PLACEMENT, RETROPYLOGRAM;  Surgeon: Billey Co, MD;  Location: [facility];  Service: Urology;  Laterality: Bilateral;  . CYSTOSCOPY/URETEROSCOPY/HOLMIUM LASER/STENT PLACEMENT Left 02/20/2020   Procedure:  CYSTOSCOPY/URETEROSCOPY/LITHOTRIPSY /STENT PLACEMENT;  Surgeon: Hollice Espy, MD;  Location: [facility];  Service: Urology;  Laterality: Left;  . PARS PLANA VITRECTOMY Right 05/20/2015   Procedure: PARS PLANA VITRECTOMY WITH 25 GAUGE, laser;  Surgeon: Milus Height, MD;  Location: [facility];  Service: Ophthalmology;  Laterality: Right;  . PARTIAL HYSTERECTOMY  1990  . TUBAL LIGATION      FHx:  Family History  Problem Relation Age of Onset  . Allergies Father   . Asthma Father   . Colon cancer Father   . Allergies Brother   . Asthma Brother   . Breast cancer Maternal Grandmother     Social History:  reports that she has never smoked. She has never used smokeless tobacco. She reports that she does not drink alcohol and does not use drugs.  Allergies:  Allergies  Allergen Reactions  . Nitrofurantoin Nausea Only  . Tramadol     Nausea and vomiting  . Sulfa Antibiotics Rash    As an infant    Review of Systems: Unable to ascertain due to patient's status  Medications Prior to Admission  Medication Sig Dispense Refill  . budesonide-formoterol (SYMBICORT) 80-4.5 MCG/ACT inhaler Take 2 puffs first thing in am and then another 2 puffs about 12 hours later. (Patient taking differently: Inhale 2 puffs into the lungs in the morning and at bedtime. Take 2 puffs first thing in am and then another 2 puffs about 12 hours later.)  1 Inhaler 11  . denosumab (PROLIA) 60 MG/ML SOSY injection Inject 60 mg into the skin every 6 (six) months.    . dorzolamide (TRUSOPT) 2 % ophthalmic solution Place 1 drop into both eyes 2 (two) times daily.    Marland Kitchen escitalopram (LEXAPRO) 20 MG tablet     . folic acid (FOLVITE) 1 MG tablet Take 1 mg by mouth daily.  11  . latanoprost (XALATAN) 0.005 % ophthalmic solution Place 1 drop into the right eye at bedtime.    . methotrexate (TREXALL) 5 MG tablet TAKE 5 TABLETS (25 MG TOTAL) BY MOUTH EVERY 7 (SEVEN) DAYS    . Multiple Vitamins-Minerals (MULTIVITAMIN WITH  MINERALS) tablet Take 1 tablet by mouth daily.    . NON FORMULARY CPAP nightly    . predniSONE (DELTASONE) 10 MG tablet Take 10 mg by mouth daily with breakfast. 12.5 mg    . SYNTHROID 137 MCG tablet TAKE 1 TABLET (137 MCG TOTAL) BY MOUTH DAILY BEFORE BREAKFAST. 90 tablet 0  . Treprostinil (TYVASO) 0.6 MG/ML SOLN Inhale 18 mcg into the lungs 4 (four) times daily. 12 breaths 4 times a day    . albuterol (VENTOLIN HFA) 108 (90 Base) MCG/ACT inhaler Inhale 2 puffs into the lungs every 6 (six) hours as needed for wheezing. (Patient not taking: Reported on 07/10/2020) 1 Inhaler 2  . brimonidine (ALPHAGAN) 0.2 % ophthalmic solution Place 1 drop into both eyes 3 (three) times daily.  (Patient not taking: Reported on 05/12/2020)    . calcium carbonate (TUMS - DOSED IN MG ELEMENTAL CALCIUM) 500 MG chewable tablet Chew 1 tablet by mouth daily as needed for indigestion or heartburn. (Patient not taking: Reported on 07/10/2020)    . cephALEXin (KEFLEX) 500 MG capsule Take 1 capsule (500 mg total) by mouth 3 (three) times daily. (Patient not taking: Reported on 06/12/2020) 21 capsule 0  . conjugated estrogens (PREMARIN) vaginal cream Apply one pea-sized amount around the opening of the urethra three times weekly. (Patient not taking: Reported on 07/10/2020) 30 g 3  . methotrexate (RHEUMATREX) 2.5 MG tablet Take by mouth. (Patient not taking: Reported on 07/10/2020)    . mirabegron ER (MYRBETRIQ) 25 MG TB24 tablet Take 1 tablet (25 mg total) by mouth daily. (Patient not taking: Reported on 07/10/2020) 30 tablet 11  . oxybutynin (DITROPAN) 5 MG tablet  (Patient not taking: Reported on 07/10/2020)    . oxyCODONE-acetaminophen (PERCOCET/ROXICET) 5-325 MG tablet  (Patient not taking: Reported on 05/12/2020)    . OXYGEN Inhale 2 L into the lungs as needed.    Marland Kitchen Respiratory Therapy Supplies (FLUTTER) DEVI Use as directed (Patient not taking: Reported on 07/10/2020) 1 each 0  . VITAMIN D PO Take 1 tablet by mouth once a week.  (Patient not taking: Reported on 07/10/2020)    . Vitamin D, Ergocalciferol, (DRISDOL) 1.25 MG (50000 UNIT) CAPS capsule Take 50,000 Units by mouth once a week. (Patient not taking: Reported on 06/12/2020)      Physical Exam: General: Alert and oriented.  No apparent distress.  Vascular: DP/PT pulses nonpalpable bilateral.  Feet appear to be cold to touch.  Capillary fill time appears to be absent to digits 1 through 4 left foot and to the dorsal foot to the midfoot area.  Capillary fill time appears to be absent to the right hallux distally.  No hair growth to digits.  Neuro: Light touch sensation reduced to both feet.  Derm: Dusky type changes to the left forefoot and right distal  hallux.  Appears to be worse on the left side than right.  Capillary fill time appears to be absent to the left first toe distal, second toe to the MPJ, third toe to the MPJ, fourth digit distal.  Patient also has dusky changes to the dorsal aspect of the left foot with capillary fill time appearing to be absent to the level of the midfoot, skin does appeared to feel warmer at this time to the dorsal foot.  Patient has new blistering area to the left dorsal lateral midfoot, no acute signs of infection present though.  Capillary fill time appears to be intact to all digits to the right foot except for the distal right hallux.   Left foot     Right foot   MSK: Patient able to move toes and both feet but decreased strength noted overall.  Results for orders placed or performed during the hospital encounter of 07/09/20 (from the past 48 hour(s))  Glucose, capillary     Status: Abnormal   Collection Time: 07/23/20 11:38 AM  Result Value Ref Range   Glucose-Capillary 161 (H) 70 - 99 mg/dL    Comment: Glucose reference range applies only to samples taken after fasting for at least 8 hours.   Comment 1 Notify RN    Comment 2 Document in Chart   Glucose, capillary     Status: Abnormal   Collection Time: 07/23/20   4:34 PM  Result Value Ref Range   Glucose-Capillary 197 (H) 70 - 99 mg/dL    Comment: Glucose reference range applies only to samples taken after fasting for at least 8 hours.   Comment 1 Notify RN    Comment 2 Document in Chart   Glucose, capillary     Status: Abnormal   Collection Time: 07/23/20  8:17 PM  Result Value Ref Range   Glucose-Capillary 223 (H) 70 - 99 mg/dL    Comment: Glucose reference range applies only to samples taken after fasting for at least 8 hours.  Glucose, capillary     Status: Abnormal   Collection Time: 07/23/20 10:21 PM  Result Value Ref Range   Glucose-Capillary 163 (H) 70 - 99 mg/dL    Comment: Glucose reference range applies only to samples taken after fasting for at least 8 hours.  Phosphorus     Status: None   Collection Time: 07/24/20  3:41 AM  Result Value Ref Range   Phosphorus 3.9 2.5 - 4.6 mg/dL    Comment: Performed at [facility], [address] [place], [place] [address]  Magnesium     Status: None   Collection Time: 07/24/20  3:41 AM  Result Value Ref Range   Magnesium 1.9 1.7 - 2.4 mg/dL    Comment: Performed at [facility], [address] [place], [place] [address]  Basic metabolic panel     Status: Abnormal   Collection Time: 07/24/20  3:41 AM  Result Value Ref Range   Sodium 143 135 - 145 mmol/L   Potassium 4.3 3.5 - 5.1 mmol/L   Chloride 101 98 - 111 mmol/L   CO2 31 22 - 32 mmol/L   Glucose, Bld 107 (H) 70 - 99 mg/dL    Comment: Glucose reference range applies only to samples taken after fasting for at least 8 hours.   BUN 38 (H) 8 - 23 mg/dL   Creatinine, Ser 1.27 (H) 0.44 - 1.00 mg/dL   Calcium 9.0 8.9 - 10.3 mg/dL   GFR calc non Af  Amer 44 (L) >60 mL/min   Anion gap 11 5 - 15    Comment: Performed at [facility], [address] [place], [place] [address]  Glucose, capillary     Status: Abnormal   Collection Time: 07/24/20  7:44 AM  Result Value Ref Range   Glucose-Capillary 117 (H) 70 -  99 mg/dL    Comment: Glucose reference range applies only to samples taken after fasting for at least 8 hours.   Comment 1 Notify RN   Glucose, capillary     Status: Abnormal   Collection Time: 07/24/20 11:21 AM  Result Value Ref Range   Glucose-Capillary 181 (H) 70 - 99 mg/dL    Comment: Glucose reference range applies only to samples taken after fasting for at least 8 hours.   Comment 1 Notify RN   Glucose, capillary     Status: Abnormal   Collection Time: 07/24/20  4:01 PM  Result Value Ref Range   Glucose-Capillary 268 (H) 70 - 99 mg/dL    Comment: Glucose reference range applies only to samples taken after fasting for at least 8 hours.   Comment 1 Notify RN   Glucose, capillary     Status: Abnormal   Collection Time: 07/24/20  9:19 PM  Result Value Ref Range   Glucose-Capillary 212 (H) 70 - 99 mg/dL    Comment: Glucose reference range applies only to samples taken after fasting for at least 8 hours.   Comment 1 Notify RN   Phosphorus     Status: None   Collection Time: 07/25/20  4:32 AM  Result Value Ref Range   Phosphorus 3.8 2.5 - 4.6 mg/dL    Comment: Performed at [facility], [address] [place], [place] [address]  Magnesium     Status: None   Collection Time: 07/25/20  4:32 AM  Result Value Ref Range   Magnesium 2.0 1.7 - 2.4 mg/dL    Comment: Performed at [facility], [address] [place], [place] [address]  Basic metabolic panel     Status: Abnormal   Collection Time: 07/25/20  4:32 AM  Result Value Ref Range   Sodium 142 135 - 145 mmol/L   Potassium 4.1 3.5 - 5.1 mmol/L   Chloride 101 98 - 111 mmol/L   CO2 32 22 - 32 mmol/L   Glucose, Bld 111 (H) 70 - 99 mg/dL    Comment: Glucose reference range applies only to samples taken after fasting for at least 8 hours.   BUN 42 (H) 8 - 23 mg/dL   Creatinine, Ser 1.23 (H) 0.44 - 1.00 mg/dL   Calcium 8.9 8.9 - 10.3 mg/dL   GFR, Estimated 46 (L) >60 mL/min   Anion gap 9 5 - 15    Comment:  Performed at [facility], [address] [place], [place] [address]  Glucose, capillary     Status: None   Collection Time: 07/25/20  8:12 AM  Result Value Ref Range   Glucose-Capillary 93 70 - 99 mg/dL    Comment: Glucose reference range applies only to samples taken after fasting for at least 8 hours.   No results found.  Blood pressure (!) 125/50, pulse 93, temperature 97.6 F (36.4 C), temperature source Oral, resp. rate 17, height 5' 7.01" (1.702 m), weight 82 kg, SpO2 90 %.  Assessment 1. Sepsis secondary to Klebsiella bacteremia due to UTI/pneumonia 2. Right and left foot ischemia  Plan -Patient seen and examined.  Appears to  be slightly improved since previous visit. -Appears to have stable ischemic changes to the left and right forefeet.  Unknown whether or not the digits will return back to normal but at this time both feet appear to be very cold to touch and pulses are not palpable.  Capillary fill time does not appear to be returning currently to the left dorsal forefoot to the level of the midfoot and digits 1 through 4 have absent capillary fill time.  The right hallux distal tip also has absent capillary fill time. -Discussed concern that potential gangrenous necrosis may ensue which may require subsequent amputation.  At this point would likely need a right distal hallux amputation and left transmetatarsal amputation but currently left side still does not appear to have great blood flow and skin integrity which could compromise the healing of the procedure.  Discussed with family in detail. -Vascular surgery has been consulted.  Discussed with Dr. Kathyrn Sheriff, pt's husband, in detail.  No plans for intervention currently.  Potentially may be needed in the future. -Discussed with patient and family that we need to let these areas demarcate further to see what will live and what will not.  This will determine the course of potential amputations in the future.  Currently  patient's blood flow appears to be compromised so amputations may not result in an optimal outcome.  Patient may require more proximal amputations if blood flow status does not improve. -We will continue to monitor.  Currently no acute signs of infection seen to both feet.  Caroline More, DPM 07/25/2020, 11:16 AM

## 2020-07-25 NOTE — Progress Notes (Addendum)
Pt unable to void via bedside commode. Bladder scan pt at 13:20. Pt retained 724 mL of urine. Performed in/out cath. Output 700 mL. Urine appeared Amber color with mucus/milky/pink/ sediment. MD notified.  Bladder scan pt at 1800: Pt retained 716 mL. Patient denied discomfort to the abdomen when palpated by this nurse. Pt wanted to try voiding on bedpan. Pt only voided 30 mL. Post void bladder scan: 421 mL. Performed In/out cath. Output:550 mL. Urine appeared dark yellow color with white mucus remnant.  Pt stated to fell better post in/out.

## 2020-07-25 NOTE — Progress Notes (Signed)
  Speech Language Pathology Treatment: Dysphagia  Patient Details Name: Stacie [name] MRN: [id number] DOB: Oct 26, 1955 Today's Date: 07/25/2020 Time: 0945-1000 SLP Time Calculation (min) (ACUTE ONLY): 15 min  Assessment / Plan / Recommendation Clinical Impression  Skilled ST services focused on patient and caregiver education. Patient presenting reclined in bed with eyes closed and son at bedside. Per son report, patient with overall improvement in PO intake without observed "signs of aspiration". Son stated that family brought in a biscuit for the patient to tried and she "ate well", feeding herself without observed swallowing issues. Patient awoke in the middle of the session and was able to participate in the conversation at the sentence level, stating "I feel much better" and "I have no problem with the food so far". Education provided to patient and son re: evaluating SLP's diet recommendation and previously recommended safe swallowing strategies (I.e. no straws), however provided patient and son the opportunity for current SLP to complete another BSE and potentially upgrade diet/change recommendations. Patient and son declined BSE at this time and stated, "We'll stick to this diet for now and she how she does over the weekend." Per patient and son preference, patient to remain on current NDD2/Thin liquid diet with previously recommended aspiration precautions. SLP to re-evaluate as appropriate. Patient left in bed with call bell in reach and family member at bedside, all questions answered and all wants/needs met. Continue POC.   HPI HPI: Stacie [name] is a 64 y.o. female with a past medical history significant for pulmonary sarcoidosis (on 2L [place] at baseline), pulmonary hypertension, OSA, asthma, hypertension, hypothyroidism, IBS, and chronic kidney disease who presents to [facility] ED on 07/09/20 due to altered mental status and shortness of breath. Patient found to have Klebsiella pneumoniae  bacteremia due to UTI, associated encephalopathy requiring BiPAP.  Patient noted to have ischemic toes on July 13, 2020.  Vascular surgery was consulted by NP [place] in regard to possible "bilateral lower extremity ischemia".  MRI on 07/20/2020 revealed a few punctate foci of restricted diffusion/T2 hyperintensity within the white matter of the bilateral frontal lobes, suggesting acute/subacute infarcts, likely embolic, with  moderate to advanced white matter disease, likely chronic microangiopathy.      SLP Plan  Continue with current plan of care       Recommendations  Diet recommendations: Dysphagia 2 (fine chop);Thin liquid Liquids provided via: Cup Supervision: Full supervision/cueing for compensatory strategies;Staff to assist with self feeding                Oral Care Recommendations: Oral care BID Follow up Recommendations: Other (comment) (TBD) SLP Visit Diagnosis: Dysphagia, unspecified (R13.10) Plan: Continue with current plan of care       GO               Loni Beckwith, M.S. CCC-SLP Speech-Language Pathologist  Loni Beckwith 07/25/2020, 10:02 AM

## 2020-07-25 NOTE — Progress Notes (Signed)
Pt hasn't voided, was offered a bedpan. Pt was able to void about 82m. Bladder scan done=6974m NP MoRandol Kernaged and ordered to do in and out.

## 2020-07-26 LAB — CBC
HCT: 35.3 % — ABNORMAL LOW (ref 36.0–46.0)
Hemoglobin: 11.1 g/dL — ABNORMAL LOW (ref 12.0–15.0)
MCH: 31 pg (ref 26.0–34.0)
MCHC: 31.4 g/dL (ref 30.0–36.0)
MCV: 98.6 fL (ref 80.0–100.0)
Platelets: 218 10*3/uL (ref 150–400)
RBC: 3.58 MIL/uL — ABNORMAL LOW (ref 3.87–5.11)
RDW: 14.9 % (ref 11.5–15.5)
WBC: 16.8 10*3/uL — ABNORMAL HIGH (ref 4.0–10.5)
nRBC: 0 % (ref 0.0–0.2)

## 2020-07-26 LAB — MAGNESIUM: Magnesium: 1.7 mg/dL (ref 1.7–2.4)

## 2020-07-26 LAB — GLUCOSE, CAPILLARY
Glucose-Capillary: 99 mg/dL (ref 70–99)
Glucose-Capillary: 156 mg/dL — ABNORMAL HIGH (ref 70–99)
Glucose-Capillary: 221 mg/dL — ABNORMAL HIGH (ref 70–99)
Glucose-Capillary: 255 mg/dL — ABNORMAL HIGH (ref 70–99)

## 2020-07-26 LAB — PHOSPHORUS: Phosphorus: 3.5 mg/dL (ref 2.5–4.6)

## 2020-07-26 MED ORDER — SODIUM CHLORIDE 0.9% FLUSH
10.0000 mL | Freq: Two times a day (BID) | INTRAVENOUS | Status: DC
  Administered 2020-07-26 – 2020-07-28 (×4): 10 mL

## 2020-07-26 MED ORDER — SODIUM CHLORIDE 0.9% FLUSH: 10.0000 mL | INTRAVENOUS | Status: DC | PRN

## 2020-07-26 MED ORDER — SODIUM CHLORIDE 0.9 % IV SOLN: INTRAVENOUS | Status: DC

## 2020-07-26 NOTE — Plan of Care (Signed)
  Problem: Health Behavior/Discharge Planning: Goal: Ability to manage health-related needs will improve Outcome: Progressing   Problem: Clinical Measurements: Goal: Ability to maintain clinical measurements within normal limits will improve Outcome: Progressing Goal: Will remain free from infection Outcome: Progressing Goal: Diagnostic test results will improve Outcome: Progressing Goal: Respiratory complications will improve Outcome: Progressing Goal: Cardiovascular complication will be avoided Outcome: Progressing

## 2020-07-26 NOTE — Progress Notes (Signed)
[facility] NAME: Stacie [name]    MR#:  [id number]  DATE OF BIRTH:  [birth year]  SUBJECTIVE:  no family in the room when I went to see this morning. Eat half of [employer] eggs and biscuit. She is trying to eat some scrambled eggs right now.  No trouble swallowing patient got Foley catheter last night due to recurrent urinary retention and frequent in and out No respiratory distress.  REVIEW OF SYSTEMS:   Review of Systems  Constitutional: Positive for malaise/fatigue. Negative for chills, fever and weight loss.  HENT: Negative for ear discharge, ear pain and nosebleeds.   Eyes: Negative for blurred vision, pain and discharge.  Respiratory: Positive for wheezing. Negative for sputum production, shortness of breath and stridor.   Cardiovascular: Negative for chest pain, palpitations, orthopnea and PND.  Gastrointestinal: Negative for abdominal pain, diarrhea, nausea and vomiting.  Genitourinary: Negative for frequency and urgency.  Musculoskeletal: Negative for back pain and joint pain.  Neurological: Positive for weakness. Negative for sensory change, speech change and focal weakness.  Psychiatric/Behavioral: Negative for depression and hallucinations. The patient is not nervous/anxious.    Tolerating Diet:improving slowly Tolerating PT: recommending CIR  DRUG ALLERGIES:   Allergies  Allergen Reactions  . Nitrofurantoin Nausea Only  . Tramadol     Nausea and vomiting  . Sulfa Antibiotics Rash    As an infant    VITALS:  Blood pressure 125/64, pulse 96, temperature 97.7 F (36.5 C), temperature source Oral, resp. rate 20, height 5' 7.01" (1.702 m), weight 82 kg, SpO2 99 %.  PHYSICAL EXAMINATION:   Physical Exam  GENERAL:  64 y.o.-year-old patient lying in the bed with no acute distress.  HEENT: Head atraumatic, normocephalic. Oropharynx and nasopharynx clear.  NECK:  Supple, no jugular venous distention. No thyroid  enlargement, no tenderness.  LUNGS: distant breath sounds bilaterally, no wheezing, rales, rhonchi. No use of accessory muscles of respiration.  CARDIOVASCULAR: S1, S2 normal. No murmurs, rubs, or gallops.  ABDOMEN: Soft, nontender, nondistended. Bowel sounds present. No organomegaly or mass. FOLEY+ (07/25/20) EXTREMITIES:        Cool to touch bilateral feet. good pedal pulses.necrosis/dry gangrene left 2/3 and 4th--stable NEUROLOGIC: cranial nerves grossly intact. No focal weakness. Generalized weakness/severe debility present  PSYCHIATRIC:  patient is alert and oriented x 2.  SKIN: as above  LABORATORY PANEL:  CBC Recent Labs  Lab 07/26/20 0509  WBC 16.8*  HGB 11.1*  HCT 35.3*  PLT 218    Chemistries  Recent Labs  Lab 07/21/20 0617 07/22/20 0304 07/25/20 0432 07/25/20 0432 07/26/20 0509  NA 141   < > 142  --   --   K 4.2   < > 4.1  --   --   CL 101   < > 101  --   --   CO2 29   < > 32  --   --   GLUCOSE 144*   < > 111*  --   --   BUN 41*   < > 42*  --   --   CREATININE 1.18*   < > 1.23*  --   --   CALCIUM 8.0*   < > 8.9  --   --   MG 1.8   < > 2.0   < > 1.7  AST 98*  --   --   --   --   ALT 92*  --   --   --   --  ALKPHOS 71  --   --   --   --   BILITOT 0.8  --   --   --   --    < > = values in this interval not displayed.   Cardiac Enzymes No results for input(s): TROPONINI in the last 168 hours. RADIOLOGY:  No results found. ASSESSMENT AND PLAN:  Stacie [name] is a 64 y.o. Female with a past medical history significant for pulmonary sarcoidosis (on 2L [place] at baseline), pulmonary hypertension, OSA, asthma, hypertension, hypothyroidism, IBS, and chronic kidney disease who presents to [facility] on 07/09/20 due to altered mental status and shortness of breath.    Severe sepsis secondary to Klebsiella pneumonia bacteremia/UTI recurrent -repeat cultures negative -patient to be continued on Rocephin till 07/26/20--may need change depending on TEE  results -appreciate ID input  Right kidney obstructive calculi and non- obstructing calculi of the left ureter Intermittent Urinary retention -status post surgical decompression with stent placement on 07/14/20 -patient will follow-up with urology as outpatient for ureteral scopic, laser lithotripsy and stent exchange -Foley now removed patient urinating well -10/8--d/c oxybutinin. Start Flomax for intermittent urine retention requiring in and out cath. Encourage BSC -10/10-- pt got foley placed (ordered by hosp NP). Consider bladder training before removing Foley this time  Acute kidney injury in the setting of severe sepsis -patient had come in with creatinine of 4.5---1.18--1.2 -urine output adequate -appreciate nephrology input  Bilateral lower extremity ischemic foot-? Embolic -necrotic left second third toes with modeling discoloration of bilateral feet -HID panel negative -?dic related -vascular consultation with Dr. Lucky Cowboy-- recommends angiogram-- on hold per family request till patient more stable. Good pedal pulses  Acute on chronic hypoxic respiratory failure secondary to acute exacerbation of pulmonary sarcoidosis/acute lung injury -she initially was placed on BiPAP then intubated and now extubated transferred out of ICU 10/ 5/21 -currently on 2 L nasal cannula oxygen. Patient uses chronically at home. -IV Solu-Medrol 20 mg Q day--d/w Dr Andree Moro po prednisone 40 mg --taper by 5 mg daily then cont 15 mg daily (steroid dependent) -- continue LABA/ICS: Arformoterol & Budesonide - Continue Treprostinil  - Continue home CPAP QHS -pt is on Methotrexate 25 mg qd at home --currently on hold  Acute metabolic encephalopathy-- improved -suspected due to gram-negative/Klebsiella severe sepsis source urine/Embolic CVA -continue supportive care  Acute CVA-- embolic in the setting of severe sepsis  -10/4--MRI brain showed acute subacute infarcts and bilateral frontal lobes  likely embolic -TEE considered-- husband wants to hold since patient is improving -continue PT OT -continue aspirin, atorvastatin -neurology recommend subcu heparin TID. No systemic anticoagulation at this time. -TEE for Monday oct 11th by Dr Ubaldo Glassing  Oral candidiasis -IV Diflucan per Dr JR--ID -will treat for 7 days  Prediabetes/Hyperglycemia -A1c 07/16/20 was 6.4% -cont ssi -sugars remaining <200  Nutrition Status: Nutrition Problem: Inadequate oral intake Etiology: decreased appetite, lethargy/confusion Signs/Symptoms: meal completion < 50% Interventions: Refer to RD note for recommendations dietitian input appreciated. Recommends tube feeding due to poor nutritional status. Discussed with patient's daughter in the room and Dr. Kathyrn Sheriff. Will continue to encourage oral intake and consider tube feeding Monday depending on po intake/calorie count once TEE is completed to avoid aspiration risk.  Family communication : daughter/Dr Fraiser in the room. Consults : ID, pulmonary, vascular, podiatry CODE STATUS: full DVT Prophylaxis : heparin  Status is: Inpatient  Remains inpatient appropriate because:Inpatient level of care appropriate due to severity of illness   Dispo: The patient is from: Home  Anticipated d/c is to: CIR              Anticipated d/c date is: > 3 days              Patient currently is not medically stable to d/c.  Marland Kitchen She is working with physical therapy. She is continued on IV antibiotics. Appears to be malnourished and poor PO intake with multiple medical issues. Poor po intake. Will need TF most likley IV diflucan for oral candidiasis TEE planned for monday  TOTAL TIME TAKING CARE OF THIS PATIENT: 25 minutes.  >50% time spent on counselling and coordination of care  Note: This dictation was prepared with Dragon dictation along with smaller phrase technology. Any transcriptional errors that result from this process are unintentional.  Fritzi Mandes  M.D    [facility]   CC: Primary care physician; Glean Hess, MDPatient ID: Stacie [name], female   DOB: 07-Feb-1956, 64 y.o.   MRN: [id number]

## 2020-07-27 ENCOUNTER — Inpatient Hospital Stay: Payer: BC Managed Care – PPO | Admitting: Anesthesiology

## 2020-07-27 ENCOUNTER — Encounter
Admission: EM | Disposition: A | Discharge status: Rehabilitation Facility | Payer: Self-pay | Source: Home / Self Care | Attending: Pulmonary Disease

## 2020-07-27 ENCOUNTER — Inpatient Hospital Stay
Admit: 2020-07-27 | Discharge: 2020-07-27 | Disposition: A | Discharge status: Home / Self Care | Payer: BC Managed Care – PPO | Attending: Cardiology | Admitting: Cardiology

## 2020-07-27 HISTORY — PX: TEE WITHOUT CARDIOVERSION: SHX5443

## 2020-07-27 LAB — HEPATIC FUNCTION PANEL
ALT: 64 U/L — ABNORMAL HIGH (ref 0–44)
AST: 53 U/L — ABNORMAL HIGH (ref 15–41)
Albumin: 2.8 g/dL — ABNORMAL LOW (ref 3.5–5.0)
Alkaline Phosphatase: 77 U/L (ref 38–126)
Bilirubin, Direct: 0.2 mg/dL (ref 0.0–0.2)
Indirect Bilirubin: 0.4 mg/dL (ref 0.3–0.9)
Total Bilirubin: 0.6 mg/dL (ref 0.3–1.2)
Total Protein: 6 g/dL — ABNORMAL LOW (ref 6.5–8.1)

## 2020-07-27 LAB — GLUCOSE, CAPILLARY
Glucose-Capillary: 112 mg/dL — ABNORMAL HIGH (ref 70–99)
Glucose-Capillary: 247 mg/dL — ABNORMAL HIGH (ref 70–99)

## 2020-07-27 LAB — MAGNESIUM: Magnesium: 1.7 mg/dL (ref 1.7–2.4)

## 2020-07-27 LAB — PHOSPHORUS: Phosphorus: 3.1 mg/dL (ref 2.5–4.6)

## 2020-07-27 SURGERY — ECHOCARDIOGRAM, TRANSESOPHAGEAL
Anesthesia: General

## 2020-07-27 MED ORDER — SODIUM CHLORIDE 0.9 % IV SOLN
2.0000 g | INTRAVENOUS | Status: DC
  Administered 2020-07-27: 2 g via INTRAVENOUS
  Filled 2020-07-27: qty 20
  Filled 2020-07-27: qty 2

## 2020-07-27 MED ORDER — LIDOCAINE HCL (PF) 2 % IJ SOLN
INTRAMUSCULAR | Status: DC | PRN
  Administered 2020-07-27: 60 mg via INTRADERMAL

## 2020-07-27 MED ORDER — LIDOCAINE HCL (PF) 2 % IJ SOLN
INTRAMUSCULAR | Status: AC
  Filled 2020-07-27: qty 5

## 2020-07-27 MED ORDER — SODIUM CHLORIDE 0.9 % IV SOLN: Freq: Once | INTRAVENOUS | Status: AC

## 2020-07-27 MED ORDER — SODIUM CHLORIDE FLUSH 0.9 % IV SOLN
INTRAVENOUS | Status: AC
  Filled 2020-07-27: qty 10

## 2020-07-27 MED ORDER — ONDANSETRON HCL 4 MG/2ML IJ SOLN
INTRAMUSCULAR | Status: AC
  Filled 2020-07-27: qty 2

## 2020-07-27 MED ORDER — PROPOFOL 500 MG/50ML IV EMUL
INTRAVENOUS | Status: AC
  Filled 2020-07-27: qty 50

## 2020-07-27 MED ORDER — ONDANSETRON HCL 4 MG/2ML IJ SOLN
INTRAMUSCULAR | Status: DC | PRN
  Administered 2020-07-27: 4 mg via INTRAVENOUS

## 2020-07-27 MED ORDER — PHENYLEPHRINE HCL (PRESSORS) 10 MG/ML IV SOLN
INTRAVENOUS | Status: DC | PRN
  Administered 2020-07-27: 100 ug via INTRAVENOUS
  Administered 2020-07-27 (×2): 50 ug via INTRAVENOUS
  Administered 2020-07-27: 100 ug via INTRAVENOUS

## 2020-07-27 MED ORDER — LIDOCAINE VISCOUS HCL 2 % MT SOLN
OROMUCOSAL | Status: AC
  Administered 2020-07-27: 15 mL
  Filled 2020-07-27: qty 15

## 2020-07-27 MED ORDER — PROPOFOL 10 MG/ML IV BOLUS
INTRAVENOUS | Status: DC | PRN
  Administered 2020-07-27 (×3): 10 mg via INTRAVENOUS
  Administered 2020-07-27: 70 mg via INTRAVENOUS

## 2020-07-27 MED ORDER — BUTAMBEN-TETRACAINE-BENZOCAINE 2-2-14 % EX AERO
INHALATION_SPRAY | CUTANEOUS | Status: AC
  Filled 2020-07-27: qty 5

## 2020-07-27 NOTE — Progress Notes (Signed)
Date of Admission:  07/09/2020     ID: Stacie [name] is a 64 y.o. female Active Problems:   Sepsis ([place])   Klebsiella pneumoniae sepsis ([place])   Critical lower limb ischemia (HCC)   Acute renal failure ([place])   Cerebrovascular accident (CVA) due to embolism of cerebral artery ([place])    Subjective: Doing better Appetite some improvement No diarrhea Participating in PT Had TEE this morning Had to be catheterized over weekend because of urine retention and needing in /out frequently  Medications:  . arformoterol  15 mcg Nebulization BID  . aspirin EC  81 mg Oral Daily  . atorvastatin  40 mg Oral Daily  . budesonide (PULMICORT) nebulizer solution  0.25 mg Nebulization BID  . Chlorhexidine Gluconate Cloth  6 each Topical Q0600  . docusate  100 mg Oral BID  . dorzolamide  1 drop Both Eyes BID  . escitalopram  20 mg Oral Daily  . feeding supplement (ENSURE ENLIVE)  237 mL Oral TID BM  . heparin injection (subcutaneous)  5,000 Units Subcutaneous Q8H  . insulin aspart  0-15 Units Subcutaneous TID AC & HS  . latanoprost  1 drop Right Eye QHS  . levothyroxine  137 mcg Oral Q0600  . multivitamin with minerals  1 tablet Oral Daily  . nystatin  1 application Topical TID  . pantoprazole  40 mg Oral Daily  . [START ON 07/28/2020] predniSONE  20 mg Oral Once   Followed by  . [START ON 07/29/2020] predniSONE  15 mg Oral Once   Followed by  . [START ON 07/30/2020] predniSONE  15 mg Oral Q breakfast  . sodium chloride flush  10-40 mL Intracatheter Q12H  . sodium chloride flush      . tamsulosin  0.4 mg Oral Daily  . thiamine injection  100 mg Intravenous Daily  . Treprostinil  18 mcg Inhalation QID    Objective: Vital signs in last 24 hours: Temp:  [97.5 F (36.4 C)-98.9 F (37.2 C)] 97.6 F (36.4 C) (10/11 0959) Pulse Rate:  [75-102] 90 (10/11 0959) Resp:  [16-28] 20 (10/11 0959) BP: (79-150)/(34-90) 93/57 (10/11 0959) SpO2:  [91 %-100 %] 98 % (10/11 0959) Weight:  [78.3  kg-81.2 kg] 81.2 kg (10/11 0729)  PHYSICAL EXAM:  General: Alert, cooperative, no distress,  Lungs: b/L air entry crepts bases Heart: Regular rate and rhythm, no murmur, rub or gallop. Abdomen: Soft,  Foley catheter Extremities: dry gangrene left foot, rt toes Skin: No rashes or lesions. Or bruising Lymph: Cervical, supraclavicular normal. Neurologic: Grossly non-focal  Lab Results Recent Labs    07/25/20 0432 07/26/20 0509  WBC  --  16.8*  HGB  --  11.1*  HCT  --  35.3*  NA 142  --   K 4.1  --   CL 101  --   CO2 32  --   BUN 42*  --   CREATININE 1.23*  --    Liver Panel Recent Labs    07/27/20 0400  PROT 6.0*  ALBUMIN 2.8*  AST 53*  ALT 64*  ALKPHOS 77  BILITOT 0.6  BILIDIR 0.2  IBILI 0.4   Assessment/Plan: Septic shock secondary to klebsiella bacteremia - resolved Klebsiella bacteremia due to complicated UTI Received 112  daysof ceftriaxone from stent placement on 9/29- will complete a 14 day course on 10/12- will dc after that   Complicated urinary tract infection with b/l ureteric stone and rt causing obstruction s/p b/l stent placement on 07/15/20.will complete  Iv antibiotic tomorrow- recommend to check urine culture as outpatient before the stent removal    CVA- b/l frontal lobe acute/subacute infarct likely embolic Minimal residual deficit With question of emboli, b/l feet gangrene TEE is recommended to r/o mural thrombus/vegetation No vegetation or mural thrombus on TEE done today 07/27/20  Oropharyngeal candidiasis. Much improved on fluconazole. Last day tomorrow  Dry gangrene feet. Left >rt No intervention currently- vascular Following  Leucocytosis- likely related to gangrene- slowly improving  Discussed the management with her and daughter

## 2020-07-27 NOTE — Anesthesia Preprocedure Evaluation (Signed)
Anesthesia Evaluation  Patient identified by MRN, date of birth, ID band Patient awake    Reviewed: Allergy & Precautions, H&P , NPO status , Patient's Chart, lab work & pertinent test results, reviewed documented beta blocker date and time   Airway Mallampati: II   Neck ROM: full    Dental  (+) Poor Dentition   Pulmonary asthma , sleep apnea ,    Pulmonary exam normal        Cardiovascular Exercise Tolerance: Poor hypertension, On Medications + Peripheral Vascular Disease  Normal cardiovascular exam+ Valvular Problems/Murmurs  Rhythm:regular Rate:Normal     Neuro/Psych PSYCHIATRIC DISORDERS Depression CVA    GI/Hepatic Neg liver ROS, GERD  Medicated,  Endo/Other  Hypothyroidism   Renal/GU Renal disease  negative genitourinary   Musculoskeletal   Abdominal   Peds  Hematology negative hematology ROS (+)   Anesthesia Other Findings Past Medical History: No date: Arrhythmia     Comment:  patient unaware if this is current No date: Asthma No date: GERD (gastroesophageal reflux disease) No date: Heart murmur No date: History of kidney stones No date: HOH (hard of hearing)     Comment:  wear aids No date: Hypothyroidism No date: IBS (irritable bowel syndrome) No date: Pulmonary hypertension (HCC) No date: Sarcoid No date: Sarcoidosis No date: Seasonal allergies CPAP with O2: Sleep apnea No date: Wears hearing aid in both ears Past Surgical History: 10/18/2018: CARDIAC CATHETERIZATION     Comment:  [facility] 07/31/2019: CATARACT EXTRACTION W/PHACO; Left     Comment:  Procedure: CATARACT EXTRACTION PHACO AND INTRAOCULAR               LENS PLACEMENT (IOC) LEFT 00:51.1  17.9%  9.15;  Surgeon:              Leandrew Koyanagi, MD;  Location: [facility];              Service: Ophthalmology;  Laterality: Left;  keep this               patient second No date: COLON SURGERY     Comment:  "colon was fused to  bladder - operated on both" 09/18/2007: COLONOSCOPY     Comment:  diverticuli, no polyps 05/26/2010: COLONOSCOPY     Comment:  diverticuli, no polyps 07/14/2020: CYSTOSCOPY WITH STENT PLACEMENT; Bilateral     Comment:  Procedure: CYSTOSCOPY WITH STENT PLACEMENT,               RETROPYLOGRAM;  Surgeon: Billey Co, MD;  Location:              [facility];  Service: Urology;  Laterality: Bilateral; 02/20/2020: CYSTOSCOPY/URETEROSCOPY/HOLMIUM LASER/STENT PLACEMENT; Left     Comment:  Procedure: CYSTOSCOPY/URETEROSCOPY/LITHOTRIPSY /STENT               PLACEMENT;  Surgeon: Hollice Espy, MD;  Location: [facility]              ORS;  Service: Urology;  Laterality: Left; 05/20/2015: PARS PLANA VITRECTOMY; Right     Comment:  Procedure: PARS PLANA VITRECTOMY WITH 25 GAUGE, laser;                Surgeon: Milus Height, MD;  Location: [facility];                Service: Ophthalmology;  Laterality: Right; 1990: PARTIAL HYSTERECTOMY No date: TUBAL LIGATION BMI    Body Mass Index: 27.04 kg/m     Reproductive/Obstetrics negative OB ROS  Anesthesia Physical Anesthesia Plan  ASA: III  Anesthesia Plan: General   Post-op Pain Management:    Induction:   PONV Risk Score and Plan:   Airway Management Planned:   Additional Equipment:   Intra-op Plan:   Post-operative Plan:   Informed Consent: I have reviewed the patients History and Physical, chart, labs and discussed the procedure including the risks, benefits and alternatives for the proposed anesthesia with the patient or authorized representative who has indicated his/her understanding and acceptance.     Dental Advisory Given  Plan Discussed with: CRNA  Anesthesia Plan Comments:         Anesthesia Quick Evaluation

## 2020-07-27 NOTE — Progress Notes (Signed)
Subjective: No new complaints.  Objective: Current vital signs: BP (!) 93/57 (BP Location: Right Arm)   Pulse 90   Temp 97.6 F (36.4 C) (Oral)   Resp 20   Ht 5' 7.5" (1.715 m)   Wt 81.2 kg   SpO2 98%   BMI 27.62 kg/m  Vital signs in last 24 hours: Temp:  [97.5 F (36.4 C)-98.9 F (37.2 C)] 97.6 F (36.4 C) (10/11 0959) Pulse Rate:  [75-102] 90 (10/11 0959) Resp:  [16-28] 20 (10/11 0959) BP: (79-150)/(34-90) 93/57 (10/11 0959) SpO2:  [91 %-100 %] 98 % (10/11 0959) Weight:  [78.3 kg-81.2 kg] 81.2 kg (10/11 0729)  Intake/Output from previous day: 10/10 0701 - 10/11 0700 In: 302.7 [I.V.:252.7; IV Piggyback:50] Out: 2700 [Urine:2700] Intake/Output this shift: No intake/output data recorded. Nutritional status:  Diet Order            Diet regular Room service appropriate? Yes; Fluid consistency: Thin  Diet effective now                 Neurologic Exam: Mental Status: Alert, oriented, thought content appropriate.  Speech fluent for the most part with some occasional paraphasic errors.  Able to follow 3 step commands without difficulty. Cranial Nerves: II: Pupils equal, round, reactive to light and accommodation III,IV, VI: ptosis not present, extra-ocular motions intact bilaterally V,VII: mild right facial droop, facial light touch sensation normal bilaterally VIII: hearing normal bilaterally IX,X: gag reflex present XI: bilateral shoulder shrug XII: midline tongue extension Motor: Right pronator drift with 5-/5 RLE strength.  Full strength on the left noted.     Lab Results: Basic Metabolic Panel: Recent Labs  Lab 07/21/20 0617 07/21/20 0617 07/22/20 0304 07/22/20 0304 07/23/20 0413 07/24/20 0341 07/25/20 0432 07/26/20 0509 07/27/20 0400  NA 141  --  143  --  143 143 142  --   --   K 4.2  --  4.5  --  3.7 4.3 4.1  --   --   CL 101  --  102  --  99 101 101  --   --   CO2 29  --  28  --  32 31 32  --   --   GLUCOSE 144*  --  152*  --  108* 107* 111*   --   --   BUN 41*  --  40*  --  38* 38* 42*  --   --   CREATININE 1.18*  --  1.18*  --  1.20* 1.27* 1.23*  --   --   CALCIUM 8.0*   < > 8.4*   < > 8.8* 9.0 8.9  --   --   MG 1.8   < > 1.7   < > 2.1 1.9 2.0 1.7 1.7  PHOS 4.2   < > 4.0   < > 3.5 3.9 3.8 3.5 3.1   < > = values in this interval not displayed.    Liver Function Tests: Recent Labs  Lab 07/21/20 0617 07/27/20 0400  AST 98* 53*  ALT 92* 64*  ALKPHOS 71 77  BILITOT 0.8 0.6  PROT 5.9* 6.0*  ALBUMIN 2.8* 2.8*   No results for input(s): LIPASE, AMYLASE in the last 168 hours. No results for input(s): AMMONIA in the last 168 hours.  CBC: Recent Labs  Lab 07/21/20 0617 07/21/20 1154 07/22/20 0304 07/23/20 0413 07/26/20 0509  WBC 20.6* 22.8* 21.6* 19.1* 16.8*  NEUTROABS 17.1*  --  18.5* 15.8*  --  HGB 13.5 13.4 12.8 12.9 11.1*  HCT 43.0 43.4 40.9 40.4 35.3*  MCV 99.3 99.5 97.4 96.4 98.6  PLT 159 172 198 236 218    Cardiac Enzymes: No results for input(s): CKTOTAL, CKMB, CKMBINDEX, TROPONINI in the last 168 hours.  Lipid Panel: Recent Labs  Lab 07/21/20 0617  CHOL 289*  TRIG 290*  HDL 33*  CHOLHDL 8.8  VLDL 58*  LDLCALC 198*    CBG: Recent Labs  Lab 07/26/20 0824 07/26/20 1133 07/26/20 1630 07/26/20 2111 07/27/20 1122  GLUCAP 99 156* 221* 255* 112*    Microbiology: Results for orders placed or performed during the hospital encounter of 07/09/20  Urine culture     Status: Abnormal   Collection Time: 07/09/20 11:27 PM   Specimen: In/Out Cath Urine  Result Value Ref Range Status   Specimen Description   Final    IN/OUT CATH URINE Performed at [facility], [address] [place], [place] [address]    Special Requests   Final    NONE Performed at [facility], [address] [place], [place] [address]    Culture >=100,000 COLONIES/mL KLEBSIELLA PNEUMONIAE (A)  Final   Report Status 07/12/2020 FINAL  Final   Organism ID, Bacteria KLEBSIELLA PNEUMONIAE (A)  Final       Susceptibility   Klebsiella pneumoniae - MIC*    AMPICILLIN RESISTANT Resistant     CEFAZOLIN <=4 SENSITIVE Sensitive     CEFTRIAXONE <=0.25 SENSITIVE Sensitive     CIPROFLOXACIN <=0.25 SENSITIVE Sensitive     GENTAMICIN <=1 SENSITIVE Sensitive     IMIPENEM <=0.25 SENSITIVE Sensitive     NITROFURANTOIN <=16 SENSITIVE Sensitive     TRIMETH/SULFA <=20 SENSITIVE Sensitive     AMPICILLIN/SULBACTAM <=2 SENSITIVE Sensitive     PIP/TAZO <=4 SENSITIVE Sensitive     * >=100,000 COLONIES/mL KLEBSIELLA PNEUMONIAE  Respiratory Panel by RT PCR (Flu A&B, Covid) - Nasopharyngeal Swab     Status: None   Collection Time: 07/09/20 11:28 PM   Specimen: Nasopharyngeal Swab  Result Value Ref Range Status   SARS Coronavirus 2 by RT PCR NEGATIVE NEGATIVE Final    Comment: (NOTE) SARS-CoV-2 target nucleic acids are NOT DETECTED.  The SARS-CoV-2 RNA is generally detectable in upper respiratoy specimens during the acute phase of infection. The lowest concentration of SARS-CoV-2 viral copies this assay can detect is 131 copies/mL. A negative result does not preclude SARS-Cov-2 infection and should not be used as the sole basis for treatment or other patient management decisions. A negative result may occur with  improper specimen collection/handling, submission of specimen other than nasopharyngeal swab, presence of viral mutation(s) within the areas targeted by this assay, and inadequate number of viral copies (<131 copies/mL). A negative result must be combined with clinical observations, patient history, and epidemiological information. The expected result is Negative.  Fact Sheet for Patients:  PinkCheek.be  Fact Sheet for Healthcare Providers:  GravelBags.it  This test is no t yet approved or cleared by the [place] FDA and  has been authorized for detection and/or diagnosis of SARS-CoV-2 by FDA under an Emergency Use Authorization  (EUA). This EUA will remain  in effect (meaning this test can be used) for the duration of the COVID-19 declaration under Section 564(b)(1) of the Act, 21 U.S.C. section 360bbb-3(b)(1), unless the authorization is terminated or revoked sooner.     Influenza A by PCR NEGATIVE NEGATIVE Final   Influenza B by PCR NEGATIVE NEGATIVE Final    Comment: (  NOTE) The Xpert Xpress SARS-CoV-2/FLU/RSV assay is intended as an aid in  the diagnosis of influenza from Nasopharyngeal swab specimens and  should not be used as a sole basis for treatment. Nasal washings and  aspirates are unacceptable for Xpert Xpress SARS-CoV-2/FLU/RSV  testing.  Fact Sheet for Patients: PinkCheek.be  Fact Sheet for Healthcare Providers: GravelBags.it  This test is not yet approved or cleared by the [place] FDA and  has been authorized for detection and/or diagnosis of SARS-CoV-2 by  FDA under an Emergency Use Authorization (EUA). This EUA will remain  in effect (meaning this test can be used) for the duration of the  Covid-19 declaration under Section 564(b)(1) of the Act, 21  U.S.C. section 360bbb-3(b)(1), unless the authorization is  terminated or revoked. Performed at [facility], [address] [place], [place] [address]   Blood Culture (routine x 2)     Status: Abnormal   Collection Time: 07/09/20 11:36 PM   Specimen: BLOOD  Result Value Ref Range Status   Specimen Description   Final    BLOOD RIGHT ANTECUBITAL Performed at [facility], [address] [place], [place] [address]    Special Requests   Final    BOTTLES DRAWN AEROBIC AND ANAEROBIC Blood Culture adequate volume Performed at [facility], [address] [place], [place] [address]    Culture  Setup Time   Final    Organism ID to follow GRAM NEGATIVE RODS IN BOTH AEROBIC AND ANAEROBIC BOTTLES CRITICAL RESULT CALLED TO, READ BACK BY AND VERIFIED  WITH: AMY THOMPSON AT 1220 07/10/20 SDR Performed at [facility], [address] [place], [place] [address]    Culture KLEBSIELLA PNEUMONIAE (A)  Final   Report Status 07/12/2020 FINAL  Final   Organism ID, Bacteria KLEBSIELLA PNEUMONIAE  Final      Susceptibility   Klebsiella pneumoniae - MIC*    AMPICILLIN RESISTANT Resistant     CEFAZOLIN <=4 SENSITIVE Sensitive     CEFEPIME <=0.12 SENSITIVE Sensitive     CEFTAZIDIME <=1 SENSITIVE Sensitive     CEFTRIAXONE <=0.25 SENSITIVE Sensitive     CIPROFLOXACIN <=0.25 SENSITIVE Sensitive     GENTAMICIN <=1 SENSITIVE Sensitive     IMIPENEM <=0.25 SENSITIVE Sensitive     TRIMETH/SULFA <=20 SENSITIVE Sensitive     AMPICILLIN/SULBACTAM <=2 SENSITIVE Sensitive     PIP/TAZO <=4 SENSITIVE Sensitive     * KLEBSIELLA PNEUMONIAE  Blood Culture ID Panel (Reflexed)     Status: Abnormal   Collection Time: 07/09/20 11:36 PM  Result Value Ref Range Status   Enterococcus faecalis NOT DETECTED NOT DETECTED Final   Enterococcus Faecium NOT DETECTED NOT DETECTED Final   Listeria monocytogenes NOT DETECTED NOT DETECTED Final   Staphylococcus species NOT DETECTED NOT DETECTED Final   Staphylococcus aureus (BCID) NOT DETECTED NOT DETECTED Final   Staphylococcus epidermidis NOT DETECTED NOT DETECTED Final   Staphylococcus lugdunensis NOT DETECTED NOT DETECTED Final   Streptococcus species NOT DETECTED NOT DETECTED Final   Streptococcus agalactiae NOT DETECTED NOT DETECTED Final   Streptococcus pneumoniae NOT DETECTED NOT DETECTED Final   Streptococcus pyogenes NOT DETECTED NOT DETECTED Final   A.calcoaceticus-baumannii NOT DETECTED NOT DETECTED Final   Bacteroides fragilis NOT DETECTED NOT DETECTED Final   Enterobacterales DETECTED (A) NOT DETECTED Final    Comment: Enterobacterales represent a large order of gram negative bacteria, not a single organism. CRITICAL RESULT CALLED TO, READ BACK BY AND VERIFIED WITH:  AMY THOMPSON AT 1220  04/09/20 SDR     Enterobacter cloacae complex NOT DETECTED NOT DETECTED Final   Escherichia coli NOT DETECTED NOT DETECTED Final   Klebsiella aerogenes NOT DETECTED NOT DETECTED Final   Klebsiella oxytoca NOT DETECTED NOT DETECTED Final   Klebsiella pneumoniae DETECTED (A) NOT DETECTED Final    Comment: CRITICAL RESULT CALLED TO, READ BACK BY AND VERIFIED WITH:  AMY THOMPSON AT 1220 07/10/20 SDR    Proteus species NOT DETECTED NOT DETECTED Final   Salmonella species NOT DETECTED NOT DETECTED Final   Serratia marcescens NOT DETECTED NOT DETECTED Final   Haemophilus influenzae NOT DETECTED NOT DETECTED Final   Neisseria meningitidis NOT DETECTED NOT DETECTED Final   Pseudomonas aeruginosa NOT DETECTED NOT DETECTED Final   Stenotrophomonas maltophilia NOT DETECTED NOT DETECTED Final   Candida albicans NOT DETECTED NOT DETECTED Final   Candida auris NOT DETECTED NOT DETECTED Final   Candida glabrata NOT DETECTED NOT DETECTED Final   Candida krusei NOT DETECTED NOT DETECTED Final   Candida parapsilosis NOT DETECTED NOT DETECTED Final   Candida tropicalis NOT DETECTED NOT DETECTED Final   Cryptococcus neoformans/gattii NOT DETECTED NOT DETECTED Final   CTX-M ESBL NOT DETECTED NOT DETECTED Final   Carbapenem resistance IMP NOT DETECTED NOT DETECTED Final   Carbapenem resistance KPC NOT DETECTED NOT DETECTED Final   Carbapenem resistance NDM NOT DETECTED NOT DETECTED Final   Carbapenem resist OXA 48 LIKE NOT DETECTED NOT DETECTED Final   Carbapenem resistance VIM NOT DETECTED NOT DETECTED Final    Comment: Performed at [facility], [address] [place], [place] [address]  Blood Culture (routine x 2)     Status: Abnormal   Collection Time: 07/09/20 11:37 PM   Specimen: BLOOD  Result Value Ref Range Status   Specimen Description   Final    BLOOD BLOOD RIGHT FOREARM Performed at [facility], [address], [place], [place] [address]    Special Requests   Final    BOTTLES DRAWN  AEROBIC AND ANAEROBIC Blood Culture adequate volume Performed at [facility], [address] [place], [place] [address]    Culture  Setup Time   Final    GRAM NEGATIVE RODS IN BOTH AEROBIC AND ANAEROBIC BOTTLES CRITICAL RESULT CALLED TO, READ BACK BY AND VERIFIED WITH: AMY THOMPSON AT 1220 07/10/20 SDR Performed at [facility], [address] [place], [place] [address]    Culture (A)  Final    KLEBSIELLA PNEUMONIAE SUSCEPTIBILITIES PERFORMED ON PREVIOUS CULTURE WITHIN THE LAST 5 DAYS. Performed at [facility], [address], [place], [place] [address]    Report Status 07/12/2020 FINAL  Final  Aspergillus Ag, BAL/Serum     Status: None   Collection Time: 07/10/20  5:04 AM   Specimen: Blood  Result Value Ref Range Status   Aspergillus Ag, BAL/Serum 0.03 0.00 - 0.49 Index Final    Comment: (NOTE) Performed At: [facility] [place], [place] [id number] Rush Farmer MD RJ:[id number]   MRSA PCR Screening     Status: None   Collection Time: 07/10/20 12:54 PM   Specimen: Nasal Mucosa; Nasopharyngeal  Result Value Ref Range Status   MRSA by PCR NEGATIVE NEGATIVE Final    Comment:        The GeneXpert MRSA Assay (FDA approved for NASAL specimens only), is one component of a comprehensive MRSA colonization surveillance program. It is not intended to diagnose MRSA infection nor to guide or monitor treatment for  MRSA infections. Performed at [facility], [address] [place], [place] [address]   Culture, blood (Routine X 2) w Reflex to ID Panel     Status: None   Collection Time: 07/16/20  1:29 PM   Specimen: BLOOD  Result Value Ref Range Status   Specimen Description BLOOD LEFT ANTECUBITAL  Final   Special Requests   Final    AEROBIC BOTTLE ONLY Blood Culture results may not be optimal due to an inadequate volume of blood received in culture bottles   Culture   Final    NO GROWTH 5 DAYS Performed at [facility], [address] [place], [place] [address]    Report Status 07/21/2020 FINAL  Final  Culture, blood (Routine X 2) w Reflex to ID Panel     Status: None   Collection Time: 07/16/20  5:09 PM   Specimen: BLOOD  Result Value Ref Range Status   Specimen Description BLOOD BLOOD RIGHT HAND  Final   Special Requests   Final    BOTTLES DRAWN AEROBIC ONLY Blood Culture results may not be optimal due to an inadequate volume of blood received in culture bottles   Culture   Final    NO GROWTH 5 DAYS Performed at [facility], [address] [place], [place] [address]    Report Status 07/21/2020 FINAL  Final    Coagulation Studies: No results for input(s): LABPROT, INR in the last 72 hours.  Imaging: No results found.  Medications:  I have reviewed the patient's current medications. Scheduled: . arformoterol  15 mcg Nebulization BID  . aspirin EC  81 mg Oral Daily  . atorvastatin  40 mg Oral Daily  . budesonide (PULMICORT) nebulizer solution  0.25 mg Nebulization BID  . Chlorhexidine Gluconate Cloth  6 each Topical Q0600  . docusate  100 mg Oral BID  . dorzolamide  1 drop Both Eyes BID  . escitalopram  20 mg Oral Daily  . feeding supplement (ENSURE ENLIVE)  237 mL Oral TID BM  . heparin injection (subcutaneous)  5,000 Units Subcutaneous Q8H  . insulin aspart  0-15 Units Subcutaneous TID AC & HS  . latanoprost  1 drop Right Eye QHS  . levothyroxine  137 mcg Oral Q0600  . multivitamin with minerals  1 tablet Oral Daily  . nystatin  1 application Topical TID  . pantoprazole  40 mg Oral Daily  . [START ON 07/28/2020] predniSONE  20 mg Oral Once   Followed by  . [START ON 07/29/2020] predniSONE  15 mg Oral Once   Followed by  . [START ON 07/30/2020] predniSONE  15 mg Oral Q breakfast  . sodium chloride flush  10-40 mL Intracatheter Q12H  . sodium chloride flush      . tamsulosin  0.4 mg Oral Daily  . thiamine injection  100 mg Intravenous Daily  .  Treprostinil  18 mcg Inhalation QID    Assessment/Plan: 64 year old female with a right greater than left paresis that occurred in the setting of severe sepsis.  MRI of the brain shows possible acute punctate infarcts.  She is continuing to gradually improve.  TEE performed today shows no cardiac source of emboli.  Etiology may have been infectious versus DIC.   Recommendations: 1. Agree with continued therapy and medical management    LOS: 67 days   [name], MD Neurology [phone] 07/27/2020  11:56 AM

## 2020-07-27 NOTE — Progress Notes (Signed)
Patient ID: Stacie [name], female   DOB: 1956-04-02, 64 y.o.   MRN: [id number] D/w Vascular Dr Lucky Cowboy per fmaily request. No urgent indicatin for LE angiogram at present. Pt has good pulse LE. She will f/u after rehab with Vascular surgery.  Will work towrds d/c plans to CIR--informed Dr Kathyrn Sheriff. agrees with it.

## 2020-07-27 NOTE — Transfer of Care (Signed)
Immediate Anesthesia Transfer of Care Note  Patient: Stacie [name]  Procedure(s) Performed: TRANSESOPHAGEAL ECHOCARDIOGRAM (TEE) (N/A )  Patient Location: Nursing Unit  Anesthesia Type:General  Level of Consciousness: drowsy and patient cooperative  Airway & Oxygen Therapy: Patient Spontanous Breathing and Patient connected to nasal cannula oxygen  Post-op Assessment: Report given to RN and Post -op Vital signs reviewed and stable  Post vital signs: Reviewed and stable  Last Vitals:  Vitals Value Taken Time  BP 90/48 07/27/20 0821  Temp    Pulse 95 07/27/20 0815  Resp 22 07/27/20 0821  SpO2 98 % 07/27/20 0821    Last Pain:  Vitals:   07/27/20 0729  TempSrc:   PainSc: 0-No pain      Patients Stated Pain Goal: 0 (85/92/76 3943)  Complications: No complications documented.

## 2020-07-27 NOTE — Progress Notes (Signed)
Calorie Count Note  48 hour calorie count results  Diet: dysphagia 2 with thin liquids Supplements: Ensure Enlive po TID, each supplement provides 350 kcal and 20 grams of protein Magic cup TID with meals, each supplement provides 290 kcal and 9 grams of protein  Breakfast: 10/9: 621 kcal, 15 grams protein (100% [employer] sausage gravy biscuits, 20% eggs, 50% applesauce) Lunch: 10/9: 351 kcal, 14 grams protein (15% chicken, peas, mashed potatoes, 20% pudding, 40% Magic cup, 1 cheese stick, 1 mint oreo) Dinner: 10/9: 396 kcal, 13 grams protein (15% tilapia, mashed potatoes, gravy, broccoli, 100% peaches, 100% Magic cup) Supplements: per hx, pt refusing Ensure supplements  Total intake (10/9):  1368 kcal (67 % of minimum estimated needs)  48 protein (46 % of minimum estimated needs)  Breakfast: 10/10: 541 kcal, 18 grams protein (1/4 pumpkin muffin, 100% egg and cheese biscuit from [employer], 20% scrambled eggs, 50% applesauce) Lunch: 10/10: 299 kcal, 11 grams protein (50% braised beef with gravy, 100% mashed potatoes, 10% carrots, 100% ginger ale) Dinner: 10/10: 282 kcal, 8 grams protein (10% [place] with gravy, 80% mashed potatoes, 50% pears, 10% tea, 50% magic cup) Supplements: per hx, pt refusing Ensure supplements  Total intake (10/10): 1122 kcal (55% of minimum estimated needs) 37 grams protein (35% of  minimum estimated needs)  Estimated Nutritional Needs: NOTR:[id number] Protein:105-115 grams Fluid:>/= 2 L/day  Lajuan Lines, RD, LDN Clinical Nutrition After Hours/Weekend Pager # in [place]

## 2020-07-27 NOTE — Progress Notes (Signed)
OT Cancellation Note  Patient Details Name: DENAE ZULUETA MRN: [id number] DOB: Feb 09, 1956   Cancelled Treatment:    Reason Eval/Treat Not Completed: Other (comment). Pt recently underwent general anesthesia with OT now needing new orders to continue therapeutic intervention.   Darleen Crocker, [place], OTR/L , CBIS ascom [phone]  07/27/20, 8:58 AM   07/27/2020, 8:55 AM

## 2020-07-27 NOTE — Progress Notes (Signed)
[facility] [place] NAME: Elea Holtzclaw    MR#:  [id number]  DATE OF BIRTH:  Mar 09, 1956  SUBJECTIVE:  no family in the room when I went to see this morning. Ate all of her scrambled eggs and drank carton of milk. Feels more hungry No trouble swallowing  Status post TEE.  No respiratory distress.  REVIEW OF SYSTEMS:   Review of Systems  Constitutional: Positive for malaise/fatigue. Negative for chills, fever and weight loss.  HENT: Negative for ear discharge, ear pain and nosebleeds.   Eyes: Negative for blurred vision, pain and discharge.  Respiratory: Positive for wheezing. Negative for sputum production, shortness of breath and stridor.   Cardiovascular: Negative for chest pain, palpitations, orthopnea and PND.  Gastrointestinal: Negative for abdominal pain, diarrhea, nausea and vomiting.  Genitourinary: Negative for frequency and urgency.  Musculoskeletal: Negative for back pain and joint pain.  Neurological: Positive for weakness. Negative for sensory change, speech change and focal weakness.  Psychiatric/Behavioral: Negative for depression and hallucinations. The patient is not nervous/anxious.    Tolerating Diet:iimproed a lot within a week! Tolerating PT: recommending CIR  DRUG ALLERGIES:   Allergies  Allergen Reactions  . Nitrofurantoin Nausea Only  . Tramadol     Nausea and vomiting  . Sulfa Antibiotics Rash    As an infant    VITALS:  Blood pressure (!) 93/57, pulse 90, temperature 97.6 F (36.4 C), temperature source Oral, resp. rate 20, height 5' 7.5" (1.715 m), weight 81.2 kg, SpO2 98 %.  PHYSICAL EXAMINATION:   Physical Exam  GENERAL:  64 y.o.-year-old patient lying in the bed with no acute distress.  HEENT: Head atraumatic, normocephalic. Oropharynx and nasopharynx clear.  LUNGS: distant breath sounds bilaterally, no wheezing, rales, rhonchi. No use of accessory muscles of respiration.  CARDIOVASCULAR:  S1, S2 normal. No murmurs, rubs, or gallops.  ABDOMEN: Soft, nontender, nondistended. Bowel sounds present. No organomegaly or mass. FOLEY+ (07/25/20) EXTREMITIES:        Cool to touch bilateral feet. good pedal pulses.necrosis/dry gangrene left 2/3 and 4th--stable NEUROLOGIC: cranial nerves grossly intact. No focal weakness. Generalized weakness/severe debility present  PSYCHIATRIC:  patient is alert and oriented x 2.  SKIN: as above  LABORATORY PANEL:  CBC Recent Labs  Lab 07/26/20 0509  WBC 16.8*  HGB 11.1*  HCT 35.3*  PLT 218    Chemistries  Recent Labs  Lab 07/25/20 0432 07/26/20 0509 07/27/20 0400  NA 142  --   --   K 4.1  --   --   CL 101  --   --   CO2 32  --   --   GLUCOSE 111*  --   --   BUN 42*  --   --   CREATININE 1.23*  --   --   CALCIUM 8.9  --   --   MG 2.0   < > 1.7  AST  --   --  53*  ALT  --   --  64*  ALKPHOS  --   --  77  BILITOT  --   --  0.6   < > = values in this interval not displayed.   Cardiac Enzymes No results for input(s): TROPONINI in the last 168 hours. RADIOLOGY:  No results found. ASSESSMENT AND PLAN:  Laketra Bowdish is a 64 y.o. Female with a past medical history significant for pulmonary sarcoidosis (on 2L [place] at baseline), pulmonary hypertension, OSA, asthma, hypertension,  hypothyroidism, IBS, and chronic kidney disease who presents to [facility] ED on 07/09/20 due to altered mental status and shortness of breath.    Severe sepsis secondary to Klebsiella pneumonia bacteremia/UTI recurrent -repeat cultures negative -completed IV Rocephin till 07/26/20 -TEE results negative for any vegetations -appreciate ID input  Right kidney obstructive calculi and non- obstructing calculi of the left ureter Intermittent Urinary retention -status post surgical decompression with stent placement on 07/14/20 -patient will follow-up with urology as outpatient for ureteral scopic, laser lithotripsy and stent exchange -10/8--d/c oxybutinin. Start  Flomax for intermittent urine retention requiring in and out cath. Encourage BSC -10/10-- pt got foley placed (ordered by hosp NP). Consider bladder training before removing Foley this time  Acute kidney injury in the setting of severe sepsis -patient had come in with creatinine of 4.5---1.18--1.2 -urine output adequate -appreciate nephrology input  Bilateral lower extremity ischemic foot-? Embolic/watershed  -necrotic left second third toes with modeling discoloration of bilateral feet -HIT panel negative -?dic related -vascular consultation with Dr. Lucky Cowboy-- recommends angiogram-- on hold per family request till patient more stable. Good pedal pulses  Acute on chronic hypoxic respiratory failure secondary to acute exacerbation of pulmonary sarcoidosis/acute lung injury -she initially was placed on BiPAP then intubated and now extubated transferred out of ICU 10/ 5/21 -currently on 2 L nasal cannula oxygen. Patient uses chronically at home. -IV Solu-Medrol 20 mg Q day--d/w Dr Andree Moro po prednisone 40 mg --taper by 5 mg daily then cont 15 mg daily (steroid dependent) -- continue LABA/ICS: Arformoterol & Budesonide - Continue Treprostinil  - Continue home CPAP QHS -pt is on Methotrexate 25 mg q week at home --currently on hold. Check LFT and if stable will resume it   Acute metabolic encephalopathy-- improved -suspected due to gram-negative/Klebsiella severe sepsis source urine/Embolic CVA -continue supportive care  Acute CVA-- embolic in the setting of severe sepsis  -10/4--MRI brain showed acute subacute infarcts and bilateral frontal lobes likely embolic -continue PT/OT -on aspirin, atorvastatin -neurology recommend subcu heparin TID. No systemic anticoagulation at this time. -TEE negative -- performed by Dr Ubaldo Glassing  Oral candidiasis -IV Diflucan per Dr JR--ID -will treat for 7 days  Prediabetes/Hyperglycemia -A1c 07/16/20 was 6.4% -cont ssi -sugars remaining  <200  Nutrition Status: Nutrition Problem: Inadequate oral intake Etiology: decreased appetite, lethargy/confusion Signs/Symptoms: meal completion < 50% Interventions: Refer to RD note for recommendations dietitian input appreciated.  Calorie count shows patient PO intake has improved 55 to 67%. Will encourage supplements and upgrade diet regular. Hold off on tube feeding placement at present.  Family communication :Dr Kathyrn Sheriff on the phone Consults : ID, pulmonary, vascular, podiatry, cardiology CODE STATUS: full DVT Prophylaxis : heparin  Status is: Inpatient  Remains inpatient appropriate because:Inpatient level of care appropriate due to severity of illness   Dispo: The patient is from: Home              Anticipated d/c is to: CIR              Anticipated d/c date is: > 3 days              Patient currently is not medically stable to d/c.  She is working with physical therapy. Will discharge to CIR bed available. Need to discuss with vascular necessity for angiogram during hospitalization.  TOTAL TIME TAKING CARE OF THIS PATIENT: 25 minutes.  >50% time spent on counselling and coordination of care  Note: This dictation was prepared with Dragon dictation along with smaller  Company secretary. Any transcriptional errors that result from this process are unintentional.  Fritzi Mandes M.D    [facility]   CC: Primary care physician; Glean Hess, MDPatient ID: Dawna Part, female   DOB: 1956/08/07, 64 y.o.   MRN: [id number]

## 2020-07-27 NOTE — Progress Notes (Signed)
Physical Therapy Treatment Patient Details Name: Stacie [name] MRN: [id number] DOB: 12-11-1955 Today's Date: 07/27/2020    History of Present Illness Stacie [name] is a 64 y.o. Female with a past medical history significant for pulmonary sarcoidosis (on 2L [place] at baseline), pulmonary hypertension, OSA, asthma, hypertension, hypothyroidism, IBS, and chronic kidney disease who presents to [facility] ED on 07/09/20 due to altered mental status and shortness of breath.  Admitted for management of acute hypoxic respiratory failure and sepsis related to CAP, UTI.  Hospital course significant for periods of delirium requiring precedex infusion; questionable DIC with ischemic changes to L foot, R great toe; cystoscopy with pyelogram (9/28); extubation (9/29); and acute code stroke (9/30) due to new-onset slurred speech, R hemiparesis (NIHSS 16).    PT Comments    Pt lying in bed upon arrival to room and noted to have slightly labored breathing. O2 sats at 85%. Pt O2 line connected to portable tank in holder on bed which was empty. O2 line disconnected from tank and connected to wall oxygen. Pt able to increase O2 sats to low to mid 90s. Pt closed her eyes and unable to identify touch to bilateral feet in gangrenous areas. Pt able to sit to edge of bed with mod A for hip advancement and full upright truncal elevation. Pt performed 4 sit <> stand attempts today - two using RW and two without AD. First two attempts with RW required max+2 A and able to clear hips however unable to come into full upright standing, remaining flexed forward at hips and knees and unable to come into upright position. Third and fourth attempts performed without AD and required bilateral knee blocking. Pt instructed to lean anteriorly and verbal cues to power through BUE and BLE. Pt able to come into upright standing and remains flexed forward at hips with cues for hip and trunk extension. Pt fatigues and requires seated rest breaks between  attempts and is noted to lean forward placing elbows on knees with labored breathing. Pt continues to have surfaces changed underneath her while in standing to get to [facility] and back to bed as she is unsafe to attempt ambulation at this time. Pt attempted to perform pericare with R hand, in sitting, however became entangled in foley catheter so pericare provided to pt in standing. Pt required encouragement this session due to increased levels of fatigue today. Verbal and tactile cues needed for positioning of feet and hands during transfers. Max+2 A for sit to supine for truncal and BLE management. Pt left positioned for comfort with daughter at bedside and staff member present.     Follow Up Recommendations  CIR     Equipment Recommendations       Recommendations for Other Services       Precautions / Restrictions Precautions Precautions: Fall Restrictions Weight Bearing Restrictions: No    Mobility  Bed Mobility Overal bed mobility: Needs Assistance Bed Mobility: Supine to Sit;Sit to Supine     Supine to sit: HOB elevated;Mod assist Sit to supine: Max assist;+2 for physical assistance   General bed mobility comments: Mod A for supine to sit for scooting hips toward EOB and for completion of full upright truncal posiiton; max+2 A for trunk management and BLE elevation onto bed secondary to increased fatigue post activities  Transfers Overall transfer level: Needs assistance Equipment used: Rolling walker (2 wheeled);None Transfers: Sit to/from Stand Sit to Stand: Max assist;+2 physical assistance         General transfer comment:  4 attempts made today: first two with RW and required max+2 a for sit <> stand; pt able to clear hips however unable to come into full upright stance requiring seated rest; 3rd and 4th attempts without RW with max+2 A with bilateral knee blocking  Ambulation/Gait             General Gait Details: not safe to attempt at this time   Stairs              Wheelchair Mobility    Modified Rankin (Stroke Patients Only)       Balance Overall balance assessment: Needs assistance Sitting-balance support: Bilateral upper extremity supported;Feet supported Sitting balance-Leahy Scale: Fair Sitting balance - Comments: SBA to CGA for static sitting balance; pt tends to lean elbows forward on knees when fatigued   Standing balance support: Bilateral upper extremity supported Standing balance-Leahy Scale: Poor Standing balance comment: max+2 A for standing balance to remain upright; bilateral knee blocking required                            Cognition Arousal/Alertness: Awake/alert Behavior During Therapy: WFL for tasks assessed/performed Overall Cognitive Status: Impaired/Different from baseline                                 General Comments: Pt continues to require multimodal cues for all activities, with good carryover of single step commands with improved carryover of multi-step commands.      Exercises Other Exercises Other Exercises: pt transferred to/from [facility]; attempted self-care however unable to complete requiring total A for pericare while in standing    General Comments        Pertinent Vitals/Pain Pain Assessment: No/denies pain    Home Living                      Prior Function            PT Goals (current goals can now be found in the care plan section) Acute Rehab PT Goals PT Goal Formulation: With patient Time For Goal Achievement: 08/01/20 Potential to Achieve Goals: Fair Progress towards PT goals: Progressing toward goals    Frequency    Min 2X/week      PT Plan Current plan remains appropriate    Co-evaluation              AM-PAC PT "6 Clicks" Mobility   Outcome Measure  Help needed turning from your back to your side while in a flat bed without using bedrails?: A Little Help needed moving from lying on your back to sitting on the side  of a flat bed without using bedrails?: A Lot Help needed moving to and from a bed to a chair (including a wheelchair)?: A Lot Help needed standing up from a chair using your arms (e.g., wheelchair or bedside chair)?: A Lot Help needed to walk in hospital room?: Total Help needed climbing 3-5 steps with a railing? : Total 6 Click Score: 11    End of Session Equipment Utilized During Treatment: Gait belt;Oxygen Activity Tolerance: Patient limited by fatigue;Patient tolerated treatment well Patient left: in bed;with call bell/phone within reach;with bed alarm set;with family/visitor present;with SCD's reapplied Nurse Communication: Mobility status PT Visit Diagnosis: Muscle weakness (generalized) (M62.81);Hemiplegia and hemiparesis Hemiplegia - Right/Left: Right Hemiplegia - dominant/non-dominant: Dominant Hemiplegia - caused by: Cerebral infarction  Time: 2233-6122 PT Time Calculation (min) (ACUTE ONLY): 41 min  Charges:                       Vale Haven, SPT   Vale Haven 07/27/2020, 3:39 PM

## 2020-07-27 NOTE — Progress Notes (Signed)
Speech Language Pathology Treatment: Dysphagia;Cognitive-Linquistic  Patient Details Name: Stacie [name] MRN: [id number] DOB: [birth year] Today's Date: 07/27/2020 Time: 1300-1320 SLP Time Calculation (min) (ACUTE ONLY): 20 min  Assessment / Plan / Recommendation Clinical Impression  Skilled treatment session targeted pt's dysphagia and cognitive communication goals. SLP facilitated session by providing skilled observation of pt consuming regular diet with thin liquids via cup and straw. Pt demonstrated appropriate oral phase when consuming regular diet textures. She was also free of overt s/s of aspiration when consuming thin liquids via cup and straw. At this time, pt no longer needs follow up for dysphagia.   SLP further facilitated by providing moderate verbal cues for direction to task of eating. Upon entering the room, pt had consumed ~25% of plate but she was distracted by her cell phone. When cued, pt demonstrated increased cognitive insight as she said "I eat more when people are here to remind me." Pt is currently full supervision d/t cognitive deficits for redirection to eating in order to increase PO intake. Pt also commented that she was "actually enjoying eating now" it was "something that she looked forward to."   HPI HPI: Stacie [name] is a 64 y.o. female with a past medical history significant for pulmonary sarcoidosis (on 2L [place] at baseline), pulmonary hypertension, OSA, asthma, hypertension, hypothyroidism, IBS, and chronic kidney disease who presents to [facility] ED on 07/09/20 due to altered mental status and shortness of breath. Patient found to have Klebsiella pneumoniae bacteremia due to UTI, associated encephalopathy requiring BiPAP.  Patient noted to have ischemic toes on July 13, 2020.  Vascular surgery was consulted by NP Rust-Chester in regard to possible "bilateral lower extremity ischemia".  MRI on 07/20/2020 revealed a few punctate foci of restricted diffusion/T2  hyperintensity within the white matter of the bilateral frontal lobes, suggesting acute/subacute infarcts, likely embolic, with  moderate to advanced white matter disease, likely chronic microangiopathy.      SLP Plan  Continue with current plan of care       Recommendations  Diet recommendations: Regular;Thin liquid Liquids provided via: Cup;Straw Medication Administration: Whole meds with liquid Supervision: Full supervision/cueing for compensatory strategies (d/t cognitive deficits) Compensations: Minimize environmental distractions;Slow rate;Small sips/bites Postural Changes and/or Swallow Maneuvers: Seated upright 90 degrees                Oral Care Recommendations: Oral care BID Follow up Recommendations: Inpatient Rehab SLP Visit Diagnosis: Dysphagia, unspecified (R13.10);Cognitive communication deficit (R41.841) Plan: Continue with current plan of care       GO               Stacie [name] B. Stacie [name] M.S., CCC-SLP, [facility] Speech-Language Pathologist Rehabilitation Services Office [phone]  Stacie [name] 07/27/2020, 2:26 PM

## 2020-07-27 NOTE — Procedures (Signed)
TRANSESOPHAGEAL ECHOCARDIOGRAM   NAME:  Stacie [name]   MRN: [id number] DOB:  [birth year]   ADMIT DATE: 07/09/2020  INDICATIONS:   PROCEDURE:   Informed consent was obtained prior to the procedure. The risks, benefits and alternatives for the procedure were discussed and the patient comprehended these risks.  Risks include, but are not limited to, cough, sore throat, vomiting, nausea, somnolence, esophageal and stomach trauma or perforation, bleeding, low blood pressure, aspiration, pneumonia, infection, trauma to the teeth and death.    After a procedural time-out, the patient was given propofol per dept of anesthesia to achieve moderate sedation for 25 min.  The oropharynx was anesthetized 2 cc of topical 1% viscous lidocaine.  The transesophageal probe was inserted in the esophagus and stomach without difficulty and multiple views were obtained. I was present for the entire procedure.    COMPLICATIONS:    There were no immediate complications.  FINDINGS:  LEFT VENTRICLE: EF = 55%. No regional wall motion abnormalities.  RIGHT VENTRICLE: Normal size and function.   LEFT ATRIUM: normal no thrombus, no smoke   RIGHT ATRIUM: no thrombus  AORTIC VALVE:  [place]. No vegetations. No significant ai  MITRAL VALVE:    Normal. No vegetations. Trivial mr  TRICUSPID VALVE: Normal. No vegetations. Trivial tr  PULMONIC VALVE: Grossly normal.  INTERATRIAL SEPTUM: No PFO or ASD. Agitated saline contrast was used.   PERICARDIUM: No effusion  DESCENDING AORTA: Normal  CONCLUSION: No cardiac source of emboli or vegetations.

## 2020-07-27 NOTE — Progress Notes (Signed)
*  PRELIMINARY RESULTS* Echocardiogram Echocardiogram Transesophageal has been performed.  Sherrie Sport 07/27/2020, 8:23 AM

## 2020-07-28 ENCOUNTER — Encounter: Payer: Self-pay | Admitting: Cardiology

## 2020-07-28 ENCOUNTER — Inpatient Hospital Stay (HOSPITAL_COMMUNITY)
Admission: RE | Admit: 2020-07-28 | Discharge: 2020-08-14 | DRG: 948 | Disposition: A | Discharge status: Home Health | Payer: BC Managed Care – PPO | Source: Other Acute Inpatient Hospital | Attending: Physical Medicine & Rehabilitation | Admitting: Physical Medicine & Rehabilitation

## 2020-07-28 DIAGNOSIS — I998 Other disorder of circulatory system: Secondary | ICD-10-CM | POA: Diagnosis present

## 2020-07-28 DIAGNOSIS — E785 Hyperlipidemia, unspecified: Secondary | ICD-10-CM | POA: Diagnosis present

## 2020-07-28 DIAGNOSIS — Z79899 Other long term (current) drug therapy: Secondary | ICD-10-CM | POA: Diagnosis not present

## 2020-07-28 DIAGNOSIS — T380X5A Adverse effect of glucocorticoids and synthetic analogues, initial encounter: Secondary | ICD-10-CM | POA: Diagnosis present

## 2020-07-28 DIAGNOSIS — A021 Salmonella sepsis: Secondary | ICD-10-CM

## 2020-07-28 DIAGNOSIS — Z87442 Personal history of urinary calculi: Secondary | ICD-10-CM

## 2020-07-28 DIAGNOSIS — J8410 Pulmonary fibrosis, unspecified: Secondary | ICD-10-CM | POA: Diagnosis present

## 2020-07-28 DIAGNOSIS — I129 Hypertensive chronic kidney disease with stage 1 through stage 4 chronic kidney disease, or unspecified chronic kidney disease: Secondary | ICD-10-CM | POA: Diagnosis present

## 2020-07-28 DIAGNOSIS — D860 Sarcoidosis of lung: Secondary | ICD-10-CM | POA: Diagnosis present

## 2020-07-28 DIAGNOSIS — J9601 Acute respiratory failure with hypoxia: Secondary | ICD-10-CM

## 2020-07-28 DIAGNOSIS — D696 Thrombocytopenia, unspecified: Secondary | ICD-10-CM | POA: Diagnosis not present

## 2020-07-28 DIAGNOSIS — Z882 Allergy status to sulfonamides status: Secondary | ICD-10-CM

## 2020-07-28 DIAGNOSIS — Z9981 Dependence on supplemental oxygen: Secondary | ICD-10-CM

## 2020-07-28 DIAGNOSIS — Z8673 Personal history of transient ischemic attack (TIA), and cerebral infarction without residual deficits: Secondary | ICD-10-CM

## 2020-07-28 DIAGNOSIS — A419 Sepsis, unspecified organism: Secondary | ICD-10-CM | POA: Diagnosis not present

## 2020-07-28 DIAGNOSIS — D72829 Elevated white blood cell count, unspecified: Secondary | ICD-10-CM | POA: Diagnosis present

## 2020-07-28 DIAGNOSIS — Z7982 Long term (current) use of aspirin: Secondary | ICD-10-CM

## 2020-07-28 DIAGNOSIS — Z7952 Long term (current) use of systemic steroids: Secondary | ICD-10-CM

## 2020-07-28 DIAGNOSIS — H9190 Unspecified hearing loss, unspecified ear: Secondary | ICD-10-CM | POA: Diagnosis not present

## 2020-07-28 DIAGNOSIS — R339 Retention of urine, unspecified: Secondary | ICD-10-CM | POA: Diagnosis not present

## 2020-07-28 DIAGNOSIS — D62 Acute posthemorrhagic anemia: Secondary | ICD-10-CM | POA: Diagnosis not present

## 2020-07-28 DIAGNOSIS — J45909 Unspecified asthma, uncomplicated: Secondary | ICD-10-CM | POA: Diagnosis not present

## 2020-07-28 DIAGNOSIS — Z974 Presence of external hearing-aid: Secondary | ICD-10-CM

## 2020-07-28 DIAGNOSIS — R739 Hyperglycemia, unspecified: Secondary | ICD-10-CM | POA: Diagnosis present

## 2020-07-28 DIAGNOSIS — K589 Irritable bowel syndrome without diarrhea: Secondary | ICD-10-CM | POA: Diagnosis present

## 2020-07-28 DIAGNOSIS — I272 Pulmonary hypertension, unspecified: Secondary | ICD-10-CM | POA: Diagnosis present

## 2020-07-28 DIAGNOSIS — K219 Gastro-esophageal reflux disease without esophagitis: Secondary | ICD-10-CM | POA: Diagnosis not present

## 2020-07-28 DIAGNOSIS — Z803 Family history of malignant neoplasm of breast: Secondary | ICD-10-CM

## 2020-07-28 DIAGNOSIS — G4733 Obstructive sleep apnea (adult) (pediatric): Secondary | ICD-10-CM | POA: Diagnosis not present

## 2020-07-28 DIAGNOSIS — I70229 Atherosclerosis of native arteries of extremities with rest pain, unspecified extremity: Secondary | ICD-10-CM | POA: Diagnosis not present

## 2020-07-28 DIAGNOSIS — N189 Chronic kidney disease, unspecified: Secondary | ICD-10-CM | POA: Diagnosis present

## 2020-07-28 DIAGNOSIS — I739 Peripheral vascular disease, unspecified: Secondary | ICD-10-CM | POA: Diagnosis present

## 2020-07-28 DIAGNOSIS — R6520 Severe sepsis without septic shock: Secondary | ICD-10-CM | POA: Diagnosis not present

## 2020-07-28 DIAGNOSIS — N179 Acute kidney failure, unspecified: Secondary | ICD-10-CM | POA: Diagnosis not present

## 2020-07-28 DIAGNOSIS — I6340 Cerebral infarction due to embolism of unspecified cerebral artery: Secondary | ICD-10-CM | POA: Diagnosis present

## 2020-07-28 DIAGNOSIS — Z800 Family history of malignant neoplasm of digestive organs: Secondary | ICD-10-CM

## 2020-07-28 DIAGNOSIS — R5381 Other malaise: Secondary | ICD-10-CM | POA: Diagnosis not present

## 2020-07-28 DIAGNOSIS — E039 Hypothyroidism, unspecified: Secondary | ICD-10-CM | POA: Diagnosis not present

## 2020-07-28 DIAGNOSIS — Z825 Family history of asthma and other chronic lower respiratory diseases: Secondary | ICD-10-CM

## 2020-07-28 DIAGNOSIS — R7309 Other abnormal glucose: Secondary | ICD-10-CM

## 2020-07-28 DIAGNOSIS — Z7951 Long term (current) use of inhaled steroids: Secondary | ICD-10-CM

## 2020-07-28 DIAGNOSIS — K5901 Slow transit constipation: Secondary | ICD-10-CM | POA: Diagnosis not present

## 2020-07-28 DIAGNOSIS — Z885 Allergy status to narcotic agent status: Secondary | ICD-10-CM

## 2020-07-28 LAB — CBC
HCT: 34.5 % — ABNORMAL LOW (ref 36.0–46.0)
HCT: 35.4 % — ABNORMAL LOW (ref 36.0–46.0)
Hemoglobin: 10.7 g/dL — ABNORMAL LOW (ref 12.0–15.0)
Hemoglobin: 10.9 g/dL — ABNORMAL LOW (ref 12.0–15.0)
MCH: 31.2 pg (ref 26.0–34.0)
MCH: 31.1 pg (ref 26.0–34.0)
MCHC: 31 g/dL (ref 30.0–36.0)
MCHC: 30.8 g/dL (ref 30.0–36.0)
MCV: 100.6 fL — ABNORMAL HIGH (ref 80.0–100.0)
MCV: 101.1 fL — ABNORMAL HIGH (ref 80.0–100.0)
Platelets: 178 10*3/uL (ref 150–400)
Platelets: 170 10*3/uL (ref 150–400)
RBC: 3.43 MIL/uL — ABNORMAL LOW (ref 3.87–5.11)
RBC: 3.5 MIL/uL — ABNORMAL LOW (ref 3.87–5.11)
RDW: 15.3 % (ref 11.5–15.5)
RDW: 15.2 % (ref 11.5–15.5)
WBC: 13.6 10*3/uL — ABNORMAL HIGH (ref 4.0–10.5)
WBC: 14 10*3/uL — ABNORMAL HIGH (ref 4.0–10.5)
nRBC: 0 % (ref 0.0–0.2)
nRBC: 0 % (ref 0.0–0.2)

## 2020-07-28 LAB — GLUCOSE, CAPILLARY
Glucose-Capillary: 89 mg/dL (ref 70–99)
Glucose-Capillary: 148 mg/dL — ABNORMAL HIGH (ref 70–99)
Glucose-Capillary: 125 mg/dL — ABNORMAL HIGH (ref 70–99)
Glucose-Capillary: 156 mg/dL — ABNORMAL HIGH (ref 70–99)
Glucose-Capillary: 192 mg/dL — ABNORMAL HIGH (ref 70–99)

## 2020-07-28 LAB — BASIC METABOLIC PANEL
Anion gap: 8 (ref 5–15)
BUN: 36 mg/dL — ABNORMAL HIGH (ref 8–23)
CO2: 29 mmol/L (ref 22–32)
Calcium: 8.4 mg/dL — ABNORMAL LOW (ref 8.9–10.3)
Chloride: 104 mmol/L (ref 98–111)
Creatinine, Ser: 1.13 mg/dL — ABNORMAL HIGH (ref 0.44–1.00)
GFR, Estimated: 51 mL/min — ABNORMAL LOW (ref >60)
Glucose, Bld: 146 mg/dL — ABNORMAL HIGH (ref 70–99)
Potassium: 4.4 mmol/L (ref 3.5–5.1)
Sodium: 141 mmol/L (ref 135–145)

## 2020-07-28 LAB — CREATININE, SERUM
Creatinine, Ser: 1.14 mg/dL — ABNORMAL HIGH (ref 0.44–1.00)
GFR, Estimated: 50 mL/min — ABNORMAL LOW (ref >60)

## 2020-07-28 MED ORDER — POLYETHYLENE GLYCOL 3350 17 G PO PACK: 17.0000 g | PACK | Freq: Every day | ORAL | Status: DC | PRN

## 2020-07-28 MED ORDER — POLYETHYLENE GLYCOL 3350 17 G PO PACK: 17.0000 g | PACK | Freq: Every day | ORAL | 0 refills | Status: DC | PRN

## 2020-07-28 MED ORDER — DOCUSATE SODIUM 50 MG/5ML PO LIQD
100.0000 mg | Freq: Two times a day (BID) | ORAL | Status: DC
  Administered 2020-07-28: 100 mg via ORAL
  Filled 2020-07-28: qty 10

## 2020-07-28 MED ORDER — BUDESONIDE 0.5 MG/2ML IN SUSP
0.2500 mg | Freq: Two times a day (BID) | RESPIRATORY_TRACT | Status: DC
  Administered 2020-07-28 – 2020-08-14 (×33): 0.25 mg via RESPIRATORY_TRACT
  Filled 2020-07-28 (×35): qty 2

## 2020-07-28 MED ORDER — ATORVASTATIN CALCIUM 40 MG PO TABS: 40.0000 mg | ORAL_TABLET | Freq: Every day | ORAL | 0 refills | Status: DC

## 2020-07-28 MED ORDER — ENSURE ENLIVE PO LIQD: 237.0000 mL | Freq: Three times a day (TID) | ORAL | 12 refills | Status: DC

## 2020-07-28 MED ORDER — METHOTREXATE 2.5 MG PO TABS
25.0000 mg | ORAL_TABLET | ORAL | Status: DC
  Administered 2020-08-04 – 2020-08-11 (×2): 25 mg via ORAL
  Filled 2020-07-28 (×2): qty 10

## 2020-07-28 MED ORDER — PREDNISONE 5 MG PO TABS
15.0000 mg | ORAL_TABLET | Freq: Once | ORAL | Status: AC
  Administered 2020-07-29: 15 mg via ORAL
  Filled 2020-07-28: qty 3

## 2020-07-28 MED ORDER — FOLIC ACID 1 MG PO TABS
1.0000 mg | ORAL_TABLET | Freq: Every day | ORAL | Status: DC
  Administered 2020-07-28: 1 mg via ORAL

## 2020-07-28 MED ORDER — PANTOPRAZOLE SODIUM 40 MG PO TBEC
40.0000 mg | DELAYED_RELEASE_TABLET | Freq: Every day | ORAL | Status: DC
  Administered 2020-07-29 – 2020-08-14 (×17): 40 mg via ORAL
  Filled 2020-07-28 (×17): qty 1

## 2020-07-28 MED ORDER — THIAMINE HCL 100 MG PO TABS
100.0000 mg | ORAL_TABLET | Freq: Every day | ORAL | Status: DC
  Administered 2020-07-28: 100 mg via ORAL
  Filled 2020-07-28: qty 1

## 2020-07-28 MED ORDER — DOCUSATE SODIUM 50 MG/5ML PO LIQD: 100.0000 mg | Freq: Two times a day (BID) | ORAL | 0 refills | Status: DC

## 2020-07-28 MED ORDER — PANTOPRAZOLE SODIUM 40 MG PO TBEC
40.0000 mg | DELAYED_RELEASE_TABLET | Freq: Every day | ORAL | 0 refills | Status: DC
Start: 2020-07-29 — End: 2020-08-13

## 2020-07-28 MED ORDER — METHOTREXATE 2.5 MG PO TABS
25.0000 mg | ORAL_TABLET | ORAL | Status: DC
  Administered 2020-07-28: 25 mg via ORAL
  Filled 2020-07-28: qty 10

## 2020-07-28 MED ORDER — TAMSULOSIN HCL 0.4 MG PO CAPS
0.4000 mg | ORAL_CAPSULE | Freq: Every day | ORAL | 0 refills | Status: DC
Start: 2020-07-29 — End: 2020-08-13

## 2020-07-28 MED ORDER — METHOTREXATE SODIUM 5 MG PO TABS
25.0000 mg | ORAL_TABLET | ORAL | 0 refills | Status: DC
Start: 2020-07-28 — End: 2021-01-14

## 2020-07-28 MED ORDER — FLUCONAZOLE 100 MG PO TABS
100.0000 mg | ORAL_TABLET | Freq: Once | ORAL | Status: DC
  Filled 2020-07-28: qty 1

## 2020-07-28 MED ORDER — ARFORMOTEROL TARTRATE 15 MCG/2ML IN NEBU
15.0000 ug | INHALATION_SOLUTION | Freq: Two times a day (BID) | RESPIRATORY_TRACT | Status: DC
  Administered 2020-07-28 – 2020-08-14 (×32): 15 ug via RESPIRATORY_TRACT
  Filled 2020-07-28 (×29): qty 2

## 2020-07-28 MED ORDER — SODIUM CHLORIDE 0.9 % IV SOLN
2.0000 g | INTRAVENOUS | Status: AC
  Administered 2020-07-28: 2 g via INTRAVENOUS
  Filled 2020-07-28: qty 20

## 2020-07-28 MED ORDER — NYSTATIN 100000 UNIT/GM EX POWD: 1.0000 "application " | Freq: Three times a day (TID) | CUTANEOUS | 0 refills | Status: DC

## 2020-07-28 MED ORDER — ASPIRIN 81 MG PO TBEC
81.0000 mg | DELAYED_RELEASE_TABLET | Freq: Every day | ORAL | 11 refills | Status: DC
Start: 2020-07-29 — End: 2021-09-28

## 2020-07-28 MED ORDER — PREDNISONE 5 MG PO TABS
15.0000 mg | ORAL_TABLET | Freq: Every day | ORAL | Status: DC
  Administered 2020-07-30 – 2020-08-14 (×16): 15 mg via ORAL
  Filled 2020-07-28 (×16): qty 3

## 2020-07-28 MED ORDER — HEPARIN SODIUM (PORCINE) 5000 UNIT/ML IJ SOLN: 5000.0000 [IU] | Freq: Three times a day (TID) | INTRAMUSCULAR | Status: DC

## 2020-07-28 MED ORDER — FLUCONAZOLE 100 MG PO TABS
100.0000 mg | ORAL_TABLET | Freq: Once | ORAL | Status: AC
  Administered 2020-07-28: 100 mg via ORAL
  Filled 2020-07-28: qty 1

## 2020-07-28 MED ORDER — ENSURE ENLIVE PO LIQD
237.0000 mL | Freq: Three times a day (TID) | ORAL | Status: DC
  Administered 2020-07-29 – 2020-08-14 (×44): 237 mL via ORAL

## 2020-07-28 MED ORDER — ESCITALOPRAM OXALATE 10 MG PO TABS
20.0000 mg | ORAL_TABLET | Freq: Every day | ORAL | Status: DC
  Administered 2020-07-29 – 2020-08-14 (×17): 20 mg via ORAL
  Filled 2020-07-28 (×17): qty 2

## 2020-07-28 MED ORDER — LEVOTHYROXINE SODIUM 25 MCG PO TABS
137.0000 ug | ORAL_TABLET | Freq: Every day | ORAL | Status: DC
  Administered 2020-07-29 – 2020-08-14 (×17): 137 ug via ORAL
  Filled 2020-07-28 (×17): qty 1

## 2020-07-28 MED ORDER — ATORVASTATIN CALCIUM 40 MG PO TABS
40.0000 mg | ORAL_TABLET | Freq: Every day | ORAL | Status: DC
  Administered 2020-07-29 – 2020-08-14 (×17): 40 mg via ORAL
  Filled 2020-07-28 (×17): qty 1

## 2020-07-28 MED ORDER — BISACODYL 10 MG RE SUPP: 10.0000 mg | Freq: Every day | RECTAL | Status: DC | PRN

## 2020-07-28 MED ORDER — HEPARIN SODIUM (PORCINE) 5000 UNIT/ML IJ SOLN: 5000.0000 [IU] | Freq: Three times a day (TID) | INTRAMUSCULAR | 0 refills | Status: DC

## 2020-07-28 MED ORDER — BISACODYL 10 MG RE SUPP: 10.0000 mg | Freq: Every day | RECTAL | 0 refills | Status: DC | PRN

## 2020-07-28 MED ORDER — DORZOLAMIDE HCL 2 % OP SOLN
1.0000 [drp] | Freq: Two times a day (BID) | OPHTHALMIC | Status: DC
  Administered 2020-07-28 – 2020-08-14 (×34): 1 [drp] via OPHTHALMIC
  Filled 2020-07-28: qty 10

## 2020-07-28 MED ORDER — ACETAMINOPHEN 325 MG PO TABS
650.0000 mg | ORAL_TABLET | ORAL | Status: DC | PRN
  Administered 2020-07-29 – 2020-08-06 (×3): 650 mg via ORAL
  Filled 2020-07-28 (×3): qty 2

## 2020-07-28 MED ORDER — HEPARIN SODIUM (PORCINE) 5000 UNIT/ML IJ SOLN
5000.0000 [IU] | Freq: Three times a day (TID) | INTRAMUSCULAR | Status: DC
  Administered 2020-07-28 – 2020-08-14 (×50): 5000 [IU] via SUBCUTANEOUS
  Filled 2020-07-28 (×49): qty 1

## 2020-07-28 MED ORDER — ASPIRIN EC 81 MG PO TBEC
81.0000 mg | DELAYED_RELEASE_TABLET | Freq: Every day | ORAL | Status: DC
  Administered 2020-07-29 – 2020-08-14 (×17): 81 mg via ORAL
  Filled 2020-07-28 (×17): qty 1

## 2020-07-28 MED ORDER — NYSTATIN 100000 UNIT/GM EX POWD
1.0000 "application " | Freq: Three times a day (TID) | CUTANEOUS | Status: DC
  Administered 2020-07-28 – 2020-08-14 (×31): 1 via TOPICAL
  Filled 2020-07-28: qty 15

## 2020-07-28 MED ORDER — PREDNISONE 5 MG PO TABS
15.0000 mg | ORAL_TABLET | Freq: Every day | ORAL | 0 refills | Status: DC
Start: 2020-07-30 — End: 2021-02-04

## 2020-07-28 MED ORDER — TREPROSTINIL 0.6 MG/ML IN SOLN
18.0000 ug | Freq: Four times a day (QID) | RESPIRATORY_TRACT | Status: DC
  Administered 2020-07-29 – 2020-08-13 (×57): 18 ug via RESPIRATORY_TRACT
  Filled 2020-07-28 (×3): qty 2.9

## 2020-07-28 MED ORDER — TAMSULOSIN HCL 0.4 MG PO CAPS
0.4000 mg | ORAL_CAPSULE | Freq: Every day | ORAL | Status: DC
  Administered 2020-07-29 – 2020-08-13 (×16): 0.4 mg via ORAL
  Filled 2020-07-28 (×17): qty 1

## 2020-07-28 MED ORDER — DOCUSATE SODIUM 100 MG PO CAPS: 100.0000 mg | ORAL_CAPSULE | Freq: Two times a day (BID) | ORAL | Status: DC | PRN

## 2020-07-28 MED ORDER — INSULIN ASPART 100 UNIT/ML ~~LOC~~ SOLN
0.0000 [IU] | Freq: Three times a day (TID) | SUBCUTANEOUS | Status: DC
  Administered 2020-07-28: 2 [IU] via SUBCUTANEOUS
  Administered 2020-07-28 – 2020-07-29 (×3): 3 [IU] via SUBCUTANEOUS
  Administered 2020-07-29: 8 [IU] via SUBCUTANEOUS
  Administered 2020-07-30 (×2): 3 [IU] via SUBCUTANEOUS
  Administered 2020-07-30: 2 [IU] via SUBCUTANEOUS
  Administered 2020-07-31 (×2): 3 [IU] via SUBCUTANEOUS
  Administered 2020-08-01: 5 [IU] via SUBCUTANEOUS
  Administered 2020-08-01 (×2): 2 [IU] via SUBCUTANEOUS
  Administered 2020-08-02: 5 [IU] via SUBCUTANEOUS
  Administered 2020-08-02 – 2020-08-03 (×3): 3 [IU] via SUBCUTANEOUS
  Administered 2020-08-03: 2 [IU] via SUBCUTANEOUS
  Administered 2020-08-03: 5 [IU] via SUBCUTANEOUS
  Administered 2020-08-04 (×3): 3 [IU] via SUBCUTANEOUS
  Administered 2020-08-05: 2 [IU] via SUBCUTANEOUS
  Administered 2020-08-05: 3 [IU] via SUBCUTANEOUS
  Administered 2020-08-05 – 2020-08-06 (×4): 2 [IU] via SUBCUTANEOUS
  Administered 2020-08-07 – 2020-08-08 (×4): 3 [IU] via SUBCUTANEOUS
  Administered 2020-08-08: 2 [IU] via SUBCUTANEOUS
  Administered 2020-08-08 – 2020-08-09 (×3): 3 [IU] via SUBCUTANEOUS
  Administered 2020-08-09: 2 [IU] via SUBCUTANEOUS
  Administered 2020-08-10 – 2020-08-11 (×4): 3 [IU] via SUBCUTANEOUS
  Administered 2020-08-11 – 2020-08-12 (×4): 2 [IU] via SUBCUTANEOUS
  Administered 2020-08-13: 3 [IU] via SUBCUTANEOUS
  Administered 2020-08-13: 2 [IU] via SUBCUTANEOUS

## 2020-07-28 MED ORDER — CHLORHEXIDINE GLUCONATE 0.12 % MT SOLN
15.0000 mL | Freq: Two times a day (BID) | OROMUCOSAL | Status: DC
  Administered 2020-07-28 – 2020-08-14 (×32): 15 mL via OROMUCOSAL
  Filled 2020-07-28 (×31): qty 15

## 2020-07-28 MED ORDER — ORAL CARE MOUTH RINSE
15.0000 mL | Freq: Two times a day (BID) | OROMUCOSAL | Status: DC
  Administered 2020-07-28 – 2020-08-13 (×14): 15 mL via OROMUCOSAL

## 2020-07-28 MED ORDER — ADULT MULTIVITAMIN W/MINERALS CH
1.0000 | ORAL_TABLET | Freq: Every day | ORAL | Status: DC
  Administered 2020-07-29 – 2020-08-14 (×17): 1 via ORAL
  Filled 2020-07-28 (×17): qty 1

## 2020-07-28 MED ORDER — THIAMINE HCL 100 MG PO TABS
100.0000 mg | ORAL_TABLET | Freq: Every day | ORAL | Status: DC
  Administered 2020-07-29 – 2020-08-14 (×17): 100 mg via ORAL
  Filled 2020-07-28 (×17): qty 1

## 2020-07-28 MED ORDER — LATANOPROST 0.005 % OP SOLN
1.0000 [drp] | Freq: Every day | OPHTHALMIC | Status: DC
  Administered 2020-07-28 – 2020-08-13 (×17): 1 [drp] via OPHTHALMIC
  Filled 2020-07-28: qty 2.5

## 2020-07-28 MED ORDER — FOLIC ACID 1 MG PO TABS
1.0000 mg | ORAL_TABLET | Freq: Every day | ORAL | Status: DC
  Administered 2020-07-29 – 2020-08-14 (×17): 1 mg via ORAL
  Filled 2020-07-28 (×17): qty 1

## 2020-07-28 NOTE — Progress Notes (Signed)
Speech Language Pathology Treatment: Cognitive-Linquistic  Patient Details Name: Stacie [name] MRN: [id number] DOB: November 13, 1955 Today's Date: 07/28/2020 Time: 1020-1040 SLP Time Calculation (min) (ACUTE ONLY): 20 min  Assessment / Plan / Recommendation Clinical Impression  Skilled treatment session targeted caregiver education with pt and her daughter. Formal cognitive assessment was planned but pt is awaiting transfer to CIR. Over the course of ST services, pt has made great progress in the areas of dysphagia and cognitive communication. Pt is currently consuming a regular diet with thin liquids via cup or straw and medicines whole with thin liquids. She requires full supervision during meals d/t cognitive deficits in attention to task. Pt's daughter and husband have been trained to provide supervision when they are present. No further dysphagia therapy is indicated.   Pt continues to exhibit cognitive deficits in the following areas: sustained attention to complex verbal and functional tasks, selective attention to basic tasks, higher level problem solving, executive function, emergent and anticipatory awareness. Would recommend formal cognitive assessment at CIR as pt was cognitively independent prior to admission.     HPI HPI: Stacie [name] is a 64 y.o. female with a past medical history significant for pulmonary sarcoidosis (on 2L [place] at baseline), pulmonary hypertension, OSA, asthma, hypertension, hypothyroidism, IBS, and chronic kidney disease who presents to [facility] ED on 07/09/20 due to altered mental status and shortness of breath. Patient found to have Klebsiella pneumoniae bacteremia due to UTI, associated encephalopathy requiring BiPAP.  Patient noted to have ischemic toes on July 13, 2020.  Vascular surgery was consulted by NP Rust-Chester in regard to possible "bilateral lower extremity ischemia".  MRI on 07/20/2020 revealed a few punctate foci of restricted diffusion/T2  hyperintensity within the white matter of the bilateral frontal lobes, suggesting acute/subacute infarcts, likely embolic, with  moderate to advanced white matter disease, likely chronic microangiopathy.      SLP Plan   (formal cognitive testing at [facility])       Recommendations  Diet recommendations: Regular;Thin liquid Liquids provided via: Cup;Straw Medication Administration: Whole meds with liquid Supervision: Full supervision/cueing for compensatory strategies (d/t cognitive deficits) Compensations: Minimize environmental distractions;Slow rate;Small sips/bites Postural Changes and/or Swallow Maneuvers: Seated upright 90 degrees                Oral Care Recommendations: Oral care BID Follow up Recommendations: Inpatient Rehab SLP Visit Diagnosis: Cognitive communication deficit (R41.841) Plan:  (formal cognitive testing at CIR)       GO               Stacie [name] B. Stacie [name] M.S., CCC-SLP, [facility] Speech-Language Pathologist Rehabilitation Services Office [phone]  Stacie [name] 07/28/2020, 4:14 PM

## 2020-07-28 NOTE — Plan of Care (Signed)
  Problem: Safety: Goal: Ability to remain free from injury will improve Outcome: Adequate for Discharge   Problem: Health Behavior/Discharge Planning: Goal: Ability to manage health-related needs will improve Outcome: Adequate for Discharge   Problem: Clinical Measurements: Goal: Ability to maintain clinical measurements within normal limits will improve Outcome: Adequate for Discharge Goal: Will remain free from infection Outcome: Adequate for Discharge Goal: Diagnostic test results will improve Outcome: Adequate for Discharge Goal: Respiratory complications will improve Outcome: Adequate for Discharge Goal: Cardiovascular complication will be avoided Outcome: Adequate for Discharge   Problem: Activity: Goal: Risk for activity intolerance will decrease Outcome: Adequate for Discharge   Problem: Nutrition: Goal: Adequate nutrition will be maintained Outcome: Adequate for Discharge   Problem: Coping: Goal: Level of anxiety will decrease Outcome: Adequate for Discharge   Problem: Elimination: Goal: Will not experience complications related to bowel motility Outcome: Adequate for Discharge Goal: Will not experience complications related to urinary retention Outcome: Adequate for Discharge   Problem: Pain Managment: Goal: General experience of comfort will improve Outcome: Adequate for Discharge   Problem: Safety: Goal: Ability to remain free from injury will improve Outcome: Adequate for Discharge   Problem: Skin Integrity: Goal: Risk for impaired skin integrity will decrease Outcome: Adequate for Discharge

## 2020-07-28 NOTE — Anesthesia Postprocedure Evaluation (Signed)
Anesthesia Post Note  Patient: Stacie [name]  Procedure(s) Performed: TRANSESOPHAGEAL ECHOCARDIOGRAM (TEE) (N/A )  Patient location during evaluation: PACU Anesthesia Type: General Level of consciousness: awake and alert Pain management: pain level controlled Vital Signs Assessment: post-procedure vital signs reviewed and stable Respiratory status: spontaneous breathing, nonlabored ventilation, respiratory function stable and patient connected to nasal cannula oxygen Cardiovascular status: blood pressure returned to baseline and stable Postop Assessment: no apparent nausea or vomiting Anesthetic complications: no   No complications documented.   Last Vitals:  Vitals:   07/28/20 0737 07/28/20 0745  BP:  124/65  Pulse:  88  Resp:  15  Temp:  36.9 C  SpO2: 99% 100%    Last Pain:  Vitals:   07/28/20 0745  TempSrc: Oral  PainSc:                  Molli Barrows

## 2020-07-28 NOTE — Progress Notes (Signed)
PHARMACIST - PHYSICIAN COMMUNICATION  DR:   Fritzi Mandes  CONCERNING: IV to Oral Route Change Policy  RECOMMENDATION: This patient is receiving thiamine by the intravenous route.  Based on criteria approved by the Pharmacy and Therapeutics Committee, the intravenous medication(s) is/are being converted to the equivalent oral dose form(s).   DESCRIPTION: These criteria include:  The patient is eating (either orally or via tube) and/or has been taking other orally administered medications for a least 24 hours  The patient has no evidence of active gastrointestinal bleeding or impaired GI absorption (gastrectomy, short bowel, patient on TNA or NPO).  If you have questions about this conversion, please contact the Pharmacy Department  _0   [phone] )  [facility] _1   [phone] )  [facility] _2   [phone] )  [facility] _3   [phone] )  [facility] _4   [phone] )  [place], PharmD, BCPS Clinical Pharmacist 07/28/2020 9:19 AM

## 2020-07-28 NOTE — Progress Notes (Signed)
Patient currently on home CPAP equipment, nasal mask, with 2L O2 bleed in.

## 2020-07-28 NOTE — Progress Notes (Signed)
Admit to unit, reviewed orders, therapy schedule, medications and plan of care. Collene Leyden Zafir Schauer

## 2020-07-28 NOTE — Discharge Summary (Signed)
[facility] NAME: Stacie [name]    MR#:  [id number]  DATE OF BIRTH:  Oct 04, 1956  DATE OF ADMISSION:  07/09/2020 ADMITTING PHYSICIAN: No admitting provider for patient encounter.  DATE OF DISCHARGE: 07/28/2020  PRIMARY CARE PHYSICIAN: Glean Hess, MD    ADMISSION DIAGNOSIS:  Elevated LFTs [R79.89] Sepsis ([place]) [A41.9] Sepsis, due to unspecified organism, unspecified whether acute organ dysfunction present ([place]) [A41.9]  DISCHARGE DIAGNOSIS:  #Severe sepsis secondary to Klebsiella pneumonia/UTI #Right kidney obstructive calculi and non-obstructing calculi of left ureter status post surgical decompression with stent placement on 28 September #Acute renal failure in the setting of severe sepsis improved #Bilateral lower extremity ischemic foot/dry gangrene suspected embolic #Acute on chronic hypoxic respiratory failure secondary to acute exacerbation of pulmonary sarcoidosis/acute lung injury in the setting of severe sepsis-- on chronic oxygen #Metabolic encephalopathy-- resolved #Acute CVA appears embolic in the setting of severe sepsis #Oral candidiasis #Acute urinary retention-- patient has Foley catheter SECONDARY DIAGNOSIS:   Past Medical History:  Diagnosis Date  . Arrhythmia    patient unaware if this is current  . Asthma   . GERD (gastroesophageal reflux disease)   . Heart murmur   . History of kidney stones   . HOH (hard of hearing)    wear aids  . Hypothyroidism   . IBS (irritable bowel syndrome)   . Pulmonary hypertension ([place])   . Sarcoid   . Sarcoidosis   . Seasonal allergies   . Sleep apnea CPAP with O2  . Wears hearing aid in both ears     HOSPITAL COURSE:   Stacie [name] is a 64 y.o. Female with a past medical history significant for pulmonary sarcoidosis (on 2L [place] at baseline), pulmonary hypertension, OSA, asthma, hypertension, hypothyroidism, IBS, and chronic kidney disease who presents to  [facility] ED on 07/09/20 due to altered mental status and shortness of breath.   Severe sepsis secondary to Klebsiella pneumonia bacteremia/UTI recurrent sepsis resolved -repeat cultures negative -completed IV Rocephin on 07/28/20 -TEE results negative for any vegetations -appreciate ID input  Right kidney obstructive calculi and non- obstructing calculi of the left ureter Intermittent Urinary retention -status post surgical decompression with stent placement on 07/14/20 -patient will follow-up with urology as outpatient for ureteral scopic, laser lithotripsy and stent exchange -10/8--d/ced oxybutinin. Started Flomax for intermittent urine retention requiring in and out cath. Encourage BSC -10/10-- pt got foley placed (ordered by hosp NP). Consider bladder training before removing Foley this time. -10/12-- patient will discharge to CIR with Foley catheter. Bladder training will be done prior to removing Foley  Acute kidney injury in the setting of severe sepsis -patient had come in with creatinine of 4.5---1.18--1.2 -urine output adequate -appreciate nephrology input  Bilateral lower extremity ischemic foot-? Embolic/watershed  -necrotic left second third toes with modeling discoloration of bilateral feet -HIT panel negative -?dic related -vascular consultation with Dr. Lucky Cowboy-- patient will follow-up as outpatient after rehab to determine further workup if needed  Acute on chronic hypoxic respiratory failure secondary to acute exacerbation of pulmonary sarcoidosis/acute lung injury -she initially was placed on BiPAP then intubated and now extubated transferred out of ICU 10/ 5/21 -currently on 2 L nasal cannula oxygen. Patient uses chronically at home. -IV Solu-Medrol 20 mg Q day--d/w Dr Andree Moro po prednisone 40 mg --taper by 5 mg daily then cont 15 mg daily (steroid dependent) -- continue LABA/ICS: Arformoterol & Budesonide - Continue Treprostinil  - Continue home CPAP  QHS -  pt is on Methotrexate 25 mg q week at home  -10/12-- LFTs nearing normal. Will start patient on home dose of methotrexate today with folic acid.  Acute metabolic encephalopathy-- improved -suspected due to gram-negative/Klebsiella severe sepsis source urine/Embolic CVA -continue supportive care  Acute CVA-- embolic in the setting of severe sepsis  -10/4--MRI brain showed acute subacute infarcts and bilateral frontal lobes likely embolic -continue PT/OT -on aspirin, atorvastatin -Neurology recommend subcu heparin TID. No systemic anticoagulation at this time. -TEE negative -- performed by Dr Ubaldo Glassing  Oral candidiasis -IV Diflucan per Dr JR--ID--completed rx on 07/28/20  Prediabetes/Hyperglycemia -A1c 07/16/20 was 6.4% -cont ssi -sugars remaining <200  Nutrition Status: Nutrition Problem: Inadequate oral intake Etiology: decreased appetite, lethargy/confusion Signs/Symptoms: meal completion < 50% Interventions: Refer to RD note for recommendations dietitian input appreciated.  Calorie count shows patient PO intake has improved 55 to 67%. Will encourage supplements and upgrade diet regular. Hold off on tube feeding placement at present.  Family communication :Dr Kathyrn Sheriff on the phone. Spoke with dter Sherron Monday in the room today Consults : ID, pulmonary, vascular, podiatry, cardiology CODE STATUS: full DVT Prophylaxis : heparin  Status is: Inpatient  Dispo: The patient is from: Home  Anticipated d/c is to: CIR  Anticipated d/c date is: today  Patient currently is  medically stable to d/c.  Patient will be discharge to CIR today. Family agrees with plan. CONSULTS OBTAINED:  Treatment Team:  Lavonia Dana, MD Billey Co, MD Catarina Hartshorn, MD Teodoro Spray, MD  DRUG ALLERGIES:   Allergies  Allergen Reactions  . Nitrofurantoin Nausea Only  . Tramadol     Nausea and vomiting  . Sulfa Antibiotics Rash    As an  infant    DISCHARGE MEDICATIONS:   Allergies as of 07/28/2020      Reactions   Nitrofurantoin Nausea Only   Tramadol    Nausea and vomiting   Sulfa Antibiotics Rash   As an infant      Medication List    STOP taking these medications   albuterol 108 (90 Base) MCG/ACT inhaler Commonly known as: Ventolin HFA   brimonidine 0.2 % ophthalmic solution Commonly known as: ALPHAGAN   Flutter Devi   mirabegron ER 25 MG Tb24 tablet Commonly known as: MYRBETRIQ   Vitamin D (Ergocalciferol) 1.25 MG (50000 UNIT) Caps capsule Commonly known as: DRISDOL   VITAMIN D PO     TAKE these medications   aspirin 81 MG EC tablet Take 1 tablet (81 mg total) by mouth daily. Swallow whole. Start taking on: July 29, 2020   atorvastatin 40 MG tablet Commonly known as: LIPITOR Take 1 tablet (40 mg total) by mouth daily. Start taking on: July 29, 2020   bisacodyl 10 MG suppository Commonly known as: DULCOLAX Place 1 suppository (10 mg total) rectally daily as needed for moderate constipation.   budesonide-formoterol 80-4.5 MCG/ACT inhaler Commonly known as: Symbicort Take 2 puffs first thing in am and then another 2 puffs about 12 hours later. What changed:  how much to take how to take this when to take this   denosumab 60 MG/ML Sosy injection Commonly known as: PROLIA Inject 60 mg into the skin every 6 (six) months.   docusate 50 MG/5ML liquid Commonly known as: COLACE Take 10 mLs (100 mg total) by mouth 2 (two) times daily.   dorzolamide 2 % ophthalmic solution Commonly known as: TRUSOPT Place 1 drop into both eyes 2 (two) times daily.   escitalopram 20 MG  tablet Commonly known as: LEXAPRO   feeding supplement (ENSURE ENLIVE) Liqd Take 237 mLs by mouth 3 (three) times daily between meals.   folic acid 1 MG tablet Commonly known as: FOLVITE Take 1 mg by mouth daily.   heparin 5000 UNIT/ML injection Inject 1 mL (5,000 Units total) into the skin every 8 (eight)  hours.   latanoprost 0.005 % ophthalmic solution Commonly known as: XALATAN Place 1 drop into the right eye at bedtime.   methotrexate 5 MG tablet Commonly known as: RHEUMATREX Take 5 tablets (25 mg total) by mouth once a week. Caution:Chemotherapy. Protect from light. What changed:  medication strength how much to take when to take this additional instructions Another medication with the same name was removed. Continue taking this medication, and follow the directions you see here.   multivitamin with minerals tablet Take 1 tablet by mouth daily.   NON FORMULARY CPAP nightly   nystatin powder Commonly known as: MYCOSTATIN/NYSTOP Apply 1 application topically 3 (three) times daily.   OXYGEN Inhale 2 L into the lungs as needed.   pantoprazole 40 MG tablet Commonly known as: PROTONIX Take 1 tablet (40 mg total) by mouth daily. Start taking on: July 29, 2020   polyethylene glycol 17 g packet Commonly known as: MIRALAX / GLYCOLAX Take 17 g by mouth daily as needed for moderate constipation.   predniSONE 5 MG tablet Commonly known as: DELTASONE Take 3 tablets (15 mg total) by mouth daily with breakfast. Start taking on: July 30, 2020 What changed:  medication strength how much to take additional instructions These instructions start on July 30, 2020. If you are unsure what to do until then, ask your doctor or other care provider.   Synthroid 137 MCG tablet Generic drug: levothyroxine TAKE 1 TABLET (137 MCG TOTAL) BY MOUTH DAILY BEFORE BREAKFAST.   tamsulosin 0.4 MG Caps capsule Commonly known as: FLOMAX Take 1 capsule (0.4 mg total) by mouth daily. Start taking on: July 29, 2020   Treprostinil 0.6 MG/ML Soln Commonly known as: TYVASO Inhale 18 mcg into the lungs 4 (four) times daily. 12 breaths 4 times a day       If you experience worsening of your admission symptoms, develop shortness of breath, life threatening emergency, suicidal or homicidal  thoughts you must seek medical attention immediately by calling 911 or calling your MD immediately  if symptoms less severe.  You Must read complete instructions/literature along with all the possible adverse reactions/side effects for all the Medicines you take and that have been prescribed to you. Take any new Medicines after you have completely understood and accept all the possible adverse reactions/side effects.   Please note  You were cared for by a hospitalist during your hospital stay. If you have any questions about your discharge medications or the care you received while you were in the hospital after you are discharged, you can call the unit and asked to speak with the hospitalist on call if the hospitalist that took care of you is not available. Once you are discharged, your primary care physician will handle any further medical issues. Please note that NO REFILLS for any discharge medications will be authorized once you are discharged, as it is imperative that you return to your primary care physician (or establish a relationship with a primary care physician if you do not have one) for your aftercare needs so that they can reassess your need for medications and monitor your lab values. Today   SUBJECTIVE  No new complaints VITAL SIGNS:  Blood pressure 124/65, pulse 88, temperature 98.4 F (36.9 C), temperature source Oral, resp. rate 15, height 5' 7.5" (1.715 m), weight 84 kg, SpO2 100 %.  I/O:    Intake/Output Summary (Last 24 hours) at 07/28/2020 1018 Last data filed at 07/28/2020 0730 Gross per 24 hour  Intake 120 ml  Output 2300 ml  Net -2180 ml    PHYSICAL EXAMINATION:  Physical Exam  GENERAL:  64 y.o.-year-old patient lying in the bed with no acute distress.  HEENT: Head atraumatic, normocephalic. Oropharynx and nasopharynx clear.  LUNGS: distant breath sounds bilaterally, no wheezing, rales, rhonchi. No use of accessory muscles of respiration.   CARDIOVASCULAR: S1, S2 normal. No murmurs, rubs, or gallops.  ABDOMEN: Soft, nontender, nondistended. Bowel sounds present. No organomegaly or mass. FOLEY+ (07/25/20) EXTREMITIES:        Cool to touch bilateral feet. good pedal pulses.necrosis/dry gangrene left 2/3 and 4th--stable NEUROLOGIC: cranial nerves grossly intact. No focal weakness. Generalized weakness/severe debility present  PSYCHIATRIC:  patient is alert and oriented x 2.  SKIN: as above  DATA REVIEW:   CBC  Recent Labs  Lab 07/28/20 0425  WBC 13.6*  HGB 10.7*  HCT 34.5*  PLT 178    Chemistries  Recent Labs  Lab 07/25/20 0432 07/27/20 0400 07/28/20 0425  NA   < >  --  141  K   < >  --  4.4  CL   < >  --  104  CO2   < >  --  29  GLUCOSE   < >  --  146*  BUN   < >  --  36*  CREATININE   < >  --  1.13*  CALCIUM   < >  --  8.4*  MG  --  1.7  --   AST  --  53*  --   ALT  --  64*  --   ALKPHOS  --  77  --   BILITOT  --  0.6  --    < > = values in this interval not displayed.    Microbiology Results   No results found for this or any previous visit (from the past 240 hour(s)).  RADIOLOGY:  No results found.   CODE STATUS:     Code Status Orders  (From admission, onward)         Start     Ordered   07/10/20 0143  Full code  Continuous        07/10/20 0143        Code Status History    Date Active Date Inactive Code Status Order ID Comments User Context   11/25/2014 1742 11/26/2014 1938 Full Code [id number]  Melvenia Needles, NP Inpatient   Advance Care Planning Activity       TOTAL TIME TAKING CARE OF THIS PATIENT: 40 minutes.    Fritzi Mandes M.D  [facility]    CC: Primary care physician; Glean Hess, MD

## 2020-07-28 NOTE — Progress Notes (Signed)
PMR Admission Coordinator Pre-Admission Assessment   Patient: Stacie [name] is an 64 y.o., female MRN: [id number] DOB: [birth year] Height: 5' 7.5" (171.5 cm) Weight: 84 kg                                                                                                                                                  Insurance Information HMO:     PPO: yes    PCP:      IPA:      80/20:      OTHER: Group [id number] PRIMARY: BCBS Commercial      Policy#: [id number]      Subscriber: Pt.  CM Name:       Phone#:      Fax#: [phone] Pre-Cert#: [id number]   Authorization effective 07/24/20.  Updates due on 08/13/20.   Employer:  Benefits:  Phone #: [phone]     Name:  Eff. Date: 04/16/20     Deduct: $7000 (met $7000)      Out of Pocket Max: $7000 (met $7000)      Life Max: N/A  CIR: 100%      SNF: 100% Outpatient: 100%     Co-Pay: none Home Health: 100%      Co-Pay: none DME: 100%     Co-Pay: none Providers: in network  SECONDARY: Medicare Part A      Policy#: [id number]      Phone#:      The "Data Collection Information Summary" for patients in Inpatient Rehabilitation Facilities with attached "Privacy Act [place] Records" was provided and verbally reviewed with: N/A   Emergency Contact Information         Contact Information     Name Relation Home Work Mobile    Seba Dalkai   [phone]    [facility] Daughter     [phone]    Jessa, Stinson Relative [phone]   [phone]    Wendall Papa     [phone]       Current Medical History  Patient Admitting Diagnosis: Sepsis, CVA   History of Present Illness: Stacie [name] is a 64 year old right-handed female with history of arrhythmia, hypertension, asthma, hypothyroidism hard of hearing with hearing aids, pulmonary sarcoidosis on 2 L nasal cannula at baseline  maintained methotrexate as well as chronic prednisone, OSA, IBS, chronic kidney disease with  creatinine 1.04-1.11.  Per chart review patient lives with spouse who is a Surveyor, quantity.  Two-level home bed and bath upstairs.  Independent with assistive device.  Presented 07/09/2020 with altered mental status and shortness of breath.  Cranial CT scan showed chronic white matter ischemic changes without acute abnormality.  CT of the chest showed chronic changes of pulmonary sarcoidosis with biapical predominant pulmonary fibrosis.  Very mild superimposed active inflammatory infiltrate.  Ultrasound of the abdomen showed liver echogenicity overall increased in  mildly inhomogeneous.  Appearance most likely indicate of of hepatic steatosis.  No focal liver lesions evident.  Admission chemistries BUN 37, creatinine 4.16, lactic acid 5.8, urinalysis negative nitrite, urine culture greater 100,000 Klebsiella, troponin 88, hemoglobin 12.2, WBC 32,900, blood cultures enterobacterales and Klebsiella.  Placed on broad-spectrum antibiotics suspect sepsis secondary to community-acquired pneumonia and UTI.  Renal service is consulted for AKI placed on lactated Ringer's.  Renal ultrasound showed no hydronephrosis.  Small volume right upper quadrant ascites.  No indications for hemodialysis.  Infectious disease consulted 07/13/2020 for Klebsiella bacteremia and maintained on ceftriaxone.  CT of abdomen completed to look for renal/ureteric stone that showed 7 to 8 mm proximal right ureteral stone with obstructive change and perinephric stranding.  Minimal free fluid within the abdomen and pelvis.  Urology consulted underwent cystoscopy as well as bilateral retrograde pyelogram with right and left ureteral stent placement 07/14/2020.  Patient would need bilateral outpatient ureteroscopy, laser lithotripsy and stent exchange in 4 to 8 weeks.  Patient did remain intubated for a short time.  Neurology consulted 07/16/2020 for right side weakness and slurred speech.  MRI showed severely limited evaluation with multiple  sequences.  Diffusion-weighted imaging significantly limited with multiple small areas of DWI hyperintensity in the bilateral white matter representing artifact versus small infarcts.  CT angiogram of head and neck limited with no evidence of large vessel occlusion or significant arterial stenosis.  MRI later repeated 07/21/2020 unchanged DWI from prior imaging with small vague foci of trace diffusion abnormality suggesting scattered small foci of subacute white matter ischemia.  Underlying advanced but nonspecific chronic signal changes in the cerebral white matter in the pons redemonstrated most commonly due to small vessel disease.  EEG showed some background slowing with rare triphasic waves noted no seizure activity.  Echocardiogram with ejection fraction of 30 to 35% the left ventricle demonstrating global hypokinesis.  Echo bubble study no evidence of thrombus and grade 3 diastolic dysfunction.  Podiatry as well as vascular surgery consulted for necrosis dry gangrene of both feet left greater than right 07/20/2020 with ischemic changes.  Vascular surgery did not feel any CT angiogram or intervention was needed and advised to continue to follow.  HIT had been ruled out.  Currently maintained on subcutaneous heparin for DVT prophylaxis.   Complete NIHSS TOTAL: 4 Glasgow Coma Scale Score: 15   Past Medical History      Past Medical History:  Diagnosis Date  . Arrhythmia      patient unaware if this is current  . Asthma    . GERD (gastroesophageal reflux disease)    . Heart murmur    . History of kidney stones    . HOH (hard of hearing)      wear aids  . Hypothyroidism    . IBS (irritable bowel syndrome)    . Pulmonary hypertension ([place])    . Sarcoid    . Sarcoidosis    . Seasonal allergies    . Sleep apnea CPAP with O2  . Wears hearing aid in both ears      Family History  family history includes Allergies in her brother and father; Asthma in her brother and father; Breast cancer in her  maternal grandmother; Colon cancer in her father.   Prior Rehab/Hospitalizations:  Has the patient had prior rehab or hospitalizations prior to admission? No   Has the patient had major surgery during 100 days prior to admission? Yes   Current Medications    Current Facility-Administered  Medications:  .  0.9 %  sodium chloride infusion, , Intravenous, Continuous, Teodoro Spray, MD, Last Rate: 300 mL/hr at 07/27/20 0813, New Bag at 07/27/20 0813 .  acetaminophen (TYLENOL) tablet 650 mg, 650 mg, Oral, Q4H PRN, Fritzi Mandes, MD, 650 mg at 07/26/20 0827 .  arformoterol (BROVANA) nebulizer solution 15 mcg, 15 mcg, Nebulization, BID, Tyler Pita, MD, 15 mcg at 07/28/20 0728 .  aspirin EC tablet 81 mg, 81 mg, Oral, Daily, [name], MD, 81 mg at 07/28/20 0954 .  atorvastatin (LIPITOR) tablet 40 mg, 40 mg, Oral, Daily, Tyler Pita, MD, 40 mg at 07/28/20 0959 .  bisacodyl (DULCOLAX) suppository 10 mg, 10 mg, Rectal, Daily PRN, Tukov-Yual, Magdalene S, NP .  budesonide (PULMICORT) nebulizer solution 0.25 mg, 0.25 mg, Nebulization, BID, Tyler Pita, MD, 0.25 mg at 07/28/20 0728 .  cefTRIAXone (ROCEPHIN) 2 g in sodium chloride 0.9 % 100 mL IVPB, 2 g, Intravenous, Q24H, Fritzi Mandes, MD .  Chlorhexidine Gluconate Cloth 2 % PADS 6 each, 6 each, Topical, Q0600, Tyler Pita, MD, 6 each at 07/28/20 0725 .  docusate (COLACE) 50 MG/5ML liquid 100 mg, 100 mg, Oral, BID, Awilda Bill, NP, 100 mg at 07/28/20 0959 .  docusate sodium (COLACE) capsule 100 mg, 100 mg, Oral, BID PRN, Darel Hong D, NP, 100 mg at 07/21/20 2015 .  dorzolamide (TRUSOPT) 2 % ophthalmic solution 1 drop, 1 drop, Both Eyes, BID, Dallie Piles, RPH, 1 drop at 07/28/20 0959 .  escitalopram (LEXAPRO) tablet 20 mg, 20 mg, Oral, Daily, Dallie Piles, RPH, 20 mg at 07/28/20 0959 .  feeding supplement (ENSURE ENLIVE) (ENSURE ENLIVE) liquid 237 mL, 237 mL, Oral, TID BM, Tyler Pita, MD, 237 mL at  07/27/20 2147 .  fluconazole (DIFLUCAN) tablet 100 mg, 100 mg, Oral, Once, Fritzi Mandes, MD .  folic acid (FOLVITE) tablet 1 mg, 1 mg, Oral, Daily, Fritzi Mandes, MD .  heparin injection 5,000 Units, 5,000 Units, Subcutaneous, Q8H, [place], Huel Cote, NP, 5,000 Units at 07/28/20 0454 .  insulin aspart (novoLOG) injection 0-15 Units, 0-15 Units, Subcutaneous, TID AC & HS, Bradly Bienenstock, NP, 5 Units at 07/27/20 2144 .  latanoprost (XALATAN) 0.005 % ophthalmic solution 1 drop, 1 drop, Right Eye, Rozanna Box, RPH, 1 drop at 07/27/20 2144 .  levothyroxine (SYNTHROID) tablet 137 mcg, 137 mcg, Oral, Q0600, Dallie Piles, RPH, 137 mcg at 07/28/20 0981 .  methotrexate (RHEUMATREX) tablet 25 mg, 25 mg, Oral, Weekly, Fritzi Mandes, MD .  multivitamin with minerals tablet 1 tablet, 1 tablet, Oral, Daily, Tyler Pita, MD, 1 tablet at 07/28/20 0954 .  nystatin (MYCOSTATIN/NYSTOP) topical powder 1 application, 1 application, Topical, TID, Fritzi Mandes, MD, 1 application at 19/14/78 1000 .  pantoprazole (PROTONIX) EC tablet 40 mg, 40 mg, Oral, Daily, Fritzi Mandes, MD, 40 mg at 07/28/20 0954 .  polyethylene glycol (MIRALAX / GLYCOLAX) packet 17 g, 17 g, Oral, Daily PRN, Darel Hong D, NP, 17 g at 07/28/20 0959 .  [COMPLETED] predniSONE (DELTASONE) tablet 40 mg, 40 mg, Oral, Once, 40 mg at 07/24/20 1038 **FOLLOWED BY** [COMPLETED] predniSONE (DELTASONE) tablet 35 mg, 35 mg, Oral, Once, 35 mg at 07/25/20 1045 **FOLLOWED BY** [COMPLETED] predniSONE (DELTASONE) tablet 30 mg, 30 mg, Oral, Once, 30 mg at 07/26/20 0827 **FOLLOWED BY** [COMPLETED] predniSONE (DELTASONE) tablet 25 mg, 25 mg, Oral, Once, 25 mg at 07/27/20 1050 **FOLLOWED BY** [COMPLETED] predniSONE (DELTASONE) tablet 20 mg, 20 mg, Oral, Once, 20  mg at 07/28/20 1006 **FOLLOWED BY** [START ON 07/29/2020] predniSONE (DELTASONE) tablet 15 mg, 15 mg, Oral, Once **FOLLOWED BY** [START ON 07/30/2020] predniSONE (DELTASONE) tablet 15 mg, 15 mg,  Oral, Q breakfast, Posey Pronto, Sona, MD .  sodium chloride flush (NS) 0.9 % injection 10-40 mL, 10-40 mL, Intracatheter, Q12H, Fritzi Mandes, MD, 10 mL at 07/28/20 1006 .  sodium chloride flush (NS) 0.9 % injection 10-40 mL, 10-40 mL, Intracatheter, PRN, Fritzi Mandes, MD .  tamsulosin [facility]) capsule 0.4 mg, 0.4 mg, Oral, Daily, Fritzi Mandes, MD, 0.4 mg at 07/28/20 0956 .  thiamine tablet 100 mg, 100 mg, Oral, Daily, Lu Duffel, RPH, 100 mg at 07/28/20 0956 .  Treprostinil (TYVASO) inhalation solution 18 mcg, 18 mcg, Inhalation, QID, Dallie Piles, RPH, 18 mcg at 07/28/20 0732   Patients Current Diet:     Diet Order                      Diet regular Room service appropriate? Yes; Fluid consistency: Thin  Diet effective now                      Precautions / Restrictions Precautions Precautions: Fall Restrictions Weight Bearing Restrictions: No    Has the patient had 2 or more falls or a fall with injury in the past year?No   Prior Activity Level Community (5-7x/wk): pt. was active in the community PTA   Prior Functional Level Prior Function Level of Independence: Independent with assistive device(s) Comments: Indep with ADLs, household and limited community mobilization; home O2 at 2L.   Self Care: Did the patient need help bathing, dressing, using the toilet or eating?  Independent   Indoor Mobility: Did the patient need assistance with walking from room to room (with or without device)? Independent   Stairs: Did the patient need assistance with internal or external stairs (with or without device)? Independent   Functional Cognition: Did the patient need help planning regular tasks such as shopping or remembering to take medications? Needed some help   Home Assistive Devices / [place] Devices/Equipment: None   Prior Device Use: Indicate devices/aids used by the patient prior to current illness, exacerbation or injury? None of the above   Current  Functional Level Cognition   Arousal/Alertness: Awake/alert Overall Cognitive Status: Impaired/Different from baseline Difficult to assess due to:  (Pt answers inconsistently) Orientation Level: Oriented X4 Following Commands: Follows one step commands inconsistently, Follows one step commands with increased time Safety/Judgement: Decreased awareness of deficits, Decreased awareness of safety General Comments: Pt continues to require multimodal cues for all activities, with good carryover of single step commands with improved carryover of multi-step commands. Attention: Sustained Sustained Attention: Impaired Sustained Attention Impairment: Verbal basic, Functional basic Memory: Impaired Memory Impairment: Decreased recall of new information, Retrieval deficit, Decreased short term memory Decreased Short Term Memory: Verbal basic, Functional basic Awareness: Impaired Awareness Impairment: Intellectual impairment, Emergent impairment, Anticipatory impairment Problem Solving: Impaired Problem Solving Impairment: Verbal basic, Functional basic Executive Function:  (all impaired by lower level deficits) Behaviors: Impulsive    Extremity Assessment (includes Sensation/Coordination)   Upper Extremity Assessment: RUE deficits/detail, LUE deficits/detail RUE Deficits / Details: Grip 3-/5. Biceps 3-/5. Triceps 3-/5.  Deltoids 2/5. PROM WFL. LUE Deficits / Details: Grip 4/5. Biceps 3+/5. Triceps 3+/5.  Deltoids 3+/5. PROM WFL.  Lower Extremity Assessment: RLE deficits/detail, LLE deficits/detail RLE Deficits / Details: R hip flexion 2-/5, hip ext 3-/5, knee flex 2-/5, knee ext  3-/5, ankle DF 1/5, ankle PF 2-/5; denies sensory deficit.  Ischemic changes R great toe. LLE Deficits / Details: R hip flexion 3-/5, hip ext 3-/5, knee flex 3-/5, knee ext 3-/5, ankle DF 2-/5, ankle PF 3-/5; denies sensory deficit.  Ischemic changes L mid-foot distally     ADLs   Overall ADL's : Needs  assistance/impaired General ADL Comments: OTR provided max/hand over hand assist for grooming/feeding tasks at bedlevel.  Pt remains total assist for lower body dressing and toileting at bedlevel.     Mobility   Overal bed mobility: Needs Assistance Bed Mobility: Supine to Sit, Sit to Supine Rolling: Min guard Supine to sit: HOB elevated, Mod assist Sit to supine: Max assist, +2 for physical assistance General bed mobility comments: Mod A for supine to sit for scooting hips toward EOB and for completion of full upright truncal posiiton; max+2 A for trunk management and BLE elevation onto bed secondary to increased fatigue post activities     Transfers   Overall transfer level: Needs assistance Equipment used: Rolling walker (2 wheeled), None Transfers: Sit to/from Stand Sit to Stand: Max assist, +2 physical assistance General transfer comment: 4 attempts made today: first two with RW and required max+2 a for sit <> stand; pt able to clear hips however unable to come into full upright stance requiring seated rest; 3rd and 4th attempts without RW with max+2 A with bilateral knee blocking     Ambulation / Gait / Stairs / Wheelchair Mobility   Ambulation/Gait General Gait Details: not safe to attempt at this time     Posture / Balance Dynamic Sitting Balance Sitting balance - Comments: SBA to CGA for static sitting balance; pt tends to lean elbows forward on knees when fatigued Balance Overall balance assessment: Needs assistance Sitting-balance support: Bilateral upper extremity supported, Feet supported Sitting balance-Leahy Scale: Fair Sitting balance - Comments: SBA to CGA for static sitting balance; pt tends to lean elbows forward on knees when fatigued Postural control: Posterior lean, Other (comment) (anterior lean) Standing balance support: Bilateral upper extremity supported Standing balance-Leahy Scale: Poor Standing balance comment: max+2 A for standing balance to remain  upright; bilateral knee blocking required     Special needs/care consideration Oxygen 2L , Skin: Abrasion to R hip, Ecchymosis to bilateral feet, Petechiae on Pt.'s back, Rash on perineum        Previous Home Environment (from acute therapy documentation) Living Arrangements: Spouse/significant other  Lives With: Spouse Available Help at Discharge: Family Type of Home: House Home Layout: Two level, Able to live on main level with bedroom/bathroom Alternate Level Stairs-Rails: Left Alternate Level Stairs-Number of Steps: 15 (15) Home Access: Stairs to enter Entrance Stairs-Rails: None Entrance Stairs-Number of Steps: 2 (2) Bathroom Shower/Tub: Tub/shower unit, Multimedia programmer: Programmer, systems: Yes How Accessible: Accessible via wheelchair, Accessible via walker Justice: No Additional Comments: Patient limited historian; will verify with family as available   Discharge Living Setting Plans for Discharge Living Setting: Patient's home Type of Home at Discharge: House Discharge Home Layout: Two level, Full bath on main level Alternate Level Stairs-Rails: Left Alternate Level Stairs-Number of Steps: 15 Discharge Home Access: Stairs to enter Entrance Stairs-Rails: None Entrance Stairs-Number of Steps: 2 Discharge Bathroom Shower/Tub: Tub/shower unit, Horticulturist, commercial: Standard Discharge Bathroom Accessibility: Yes How Accessible: Accessible via wheelchair, Accessible via walker Does the patient have any problems obtaining your medications?: No   Social/Family/Support Systems Patient Roles: Other (Comment) Contact Information: [phone]  Anticipated Caregiver: Amar Keenum (daughter) Anticipated Caregiver's Contact Information: [phone] Ability/Limitations of Caregiver: none  Caregiver Availability: 24/7 Discharge Plan Discussed with Primary Caregiver: Yes Is Caregiver In Agreement with Plan?: Yes Does  Caregiver/Family have Issues with Lodging/Transportation while Pt is in Rehab?: No     Goals Patient/Family Goal for Rehab: PT/OT/SLP Min A Expected length of stay: 18-21 days Pt/Family Agrees to Admission and willing to participate: Yes Program Orientation Provided & Reviewed with Pt/Caregiver Including Roles  & Responsibilities: Yes     Decrease burden of Care through IP rehab admission: Specialzed equipment needs, Decrease number of caregivers, Bowel and bladder program and Patient/family education     Possible need for SNF placement upon discharge:not anticipated     Patient Condition: This patient's medical and functional status has changed since the consult dated: 07/21/2020 in which the Rehabilitation Physician determined and documented that the patient's condition is appropriate for intensive rehabilitative care in an inpatient rehabilitation facility. See "History of Present Illness" (above) for medical update. Functional changes are: improvements in cognition and resolution of dysphagia. Pt continues to require max+2 assist for ADLs and transfers. Patient's medical and functional status update has been discussed with the Rehabilitation physician and patient remains appropriate for inpatient rehabilitation. Will admit to inpatient rehab today.   Preadmission Screen Completed By:  Genella Mech, CCC-SLP, 07/28/2020 10:14 AM ______________________________________________________________________   Discussed status with Dr. Ranell Patrick on 07/28/2020 at 48 and received approval for admission today.   Admission Coordinator:  Genella Mech, [id number]/Date10/09/2020

## 2020-07-28 NOTE — Discharge Instructions (Signed)
Information about methotrexate tablets  This medication education was reviewed with me or my healthcare representative as part of my discharge preparation.   WHY WAS METHOTREXATE PRESCRIBED FOR YOU? When taken once a week, methotrexate is commonly used to treat rheumatoid arthritis and skin conditions like psoriasis.  It may also be prescribed to treat some blood cancers like leukemia or other medical conditions.  WHAT DO YOU NEED TO KNOW ABOUT METHOTREXATE? Take your methotrexate ONCE WEEKLY on the same day each week. It would be best to choose one day of the week that you will take your dose and take the medication only on that day every week.   Take methotrexate exactly as prescribed by your healthcare provider, and DO NOT take methotrexate every day. DO NOT take extra doses to relieve your symptoms. When first starting methotrexate, improvement in symptoms is gradual, and symptom relief usually begins in 3 - 6 weeks after starting the medication.  Tell your healthcare provider about all of your medical conditions before starting methotrexate including if you drink alcohol often, you are pregnant or plan to become pregnant, you have kidney or liver problems, or you have a blood or bone marrow disorder.  Avoid drinking alcohol while you are prescribed methotrexate.  Methotrexate can make your skin more sensitive to the sun.  If you cannot avoid being in the sun, wear protective clothing and use sunscreen.   After discharge, you should have regular check-up appointments with the healthcare provider that is prescribing your methotrexate.   WHAT DO YOU DO IF YOU MISS A DOSE? If you are taking methotrexate ONCE WEEKLY and you miss a dose, call and speak to your healthcare provider for instructions. DO NOT take methotrexate every day.  IMPORTANT SAFETY INFORMATION Methotrexate can increase your risk of getting infections and your risk of bleeding. Call your healthcare provider immediately if you  have severe diarrhea or black stools; sores in your mouth, nose, or throat; a rash or red, peeling, blistered skin; fever or chills; trouble breathing; a racing heartbeat; unusual bruising or bleeding; dizziness or weakness.  Some medicines may interact with methotrexate and might increase your risk side effects. To help avoid this, consult your healthcare provider prior to using any new prescription or over-the-counter medications, including herbals and dietary supplements, vitamins, non-steroidal anti-inflammatory drugs (NSAIDs) like ibuprofen or naproxen, acid reflux or heartburn medications, as well as antibiotics.  This is a summary and does not cover all possible information on methotrexate. If you have questions about this medicine, talk to your healthcare provider.

## 2020-07-28 NOTE — Progress Notes (Signed)
Inpatient Rehabilitation Medication Review by a Pharmacist  A complete drug regimen review was completed for this patient to identify any potential clinically significant medication issues.  Clinically significant medication issues were identified:  no  Check AMION for pharmacist assigned to patient if future medication questions/issues arise during this admission.  Pharmacist comments:   Time spent performing this drug regimen review (minutes):  [place], PharmD, BCPS Please check AMION for all [place] contact numbers Clinical Pharmacist 07/28/2020 5:46 PM

## 2020-07-28 NOTE — Progress Notes (Signed)
PHARMACIST - PHYSICIAN COMMUNICATION DR:  Fritzi Mandes CONCERNING: Antibiotic IV to Oral Route Change Policy  RECOMMENDATION: This patient is receiving fluconazole by the intravenous route.  Based on criteria approved by the Pharmacy and Therapeutics Committee, the antibiotic(s) is/are being converted to the equivalent oral dose form(s).   DESCRIPTION: These criteria include:  Patient being treated for a respiratory tract infection, urinary tract infection, cellulitis or clostridium difficile associated diarrhea if on metronidazole  The patient is not neutropenic and does not exhibit a GI malabsorption state  The patient is eating (either orally or via tube) and/or has been taking other orally administered medications for a least 24 hours  The patient is improving clinically and has a Tmax < 100.5  If you have questions about this conversion, please contact the [place], PharmD, BCPS Clinical Pharmacist 07/28/2020 9:22 AM

## 2020-07-29 ENCOUNTER — Inpatient Hospital Stay (HOSPITAL_COMMUNITY): Payer: BC Managed Care – PPO | Admitting: Physical Therapy

## 2020-07-29 ENCOUNTER — Telehealth: Payer: Self-pay

## 2020-07-29 ENCOUNTER — Inpatient Hospital Stay (HOSPITAL_COMMUNITY): Payer: BC Managed Care – PPO | Admitting: Occupational Therapy

## 2020-07-29 ENCOUNTER — Inpatient Hospital Stay (HOSPITAL_COMMUNITY): Payer: BC Managed Care – PPO | Admitting: Speech Pathology

## 2020-07-29 DIAGNOSIS — R5381 Other malaise: Principal | ICD-10-CM

## 2020-07-29 LAB — CBC WITH DIFFERENTIAL/PLATELET
Abs Immature Granulocytes: 0.16 10*3/uL — ABNORMAL HIGH (ref 0.00–0.07)
Basophils Absolute: 0 10*3/uL (ref 0.0–0.1)
Basophils Relative: 0 %
Eosinophils Absolute: 0.2 10*3/uL (ref 0.0–0.5)
Eosinophils Relative: 2 %
HCT: 35 % — ABNORMAL LOW (ref 36.0–46.0)
Hemoglobin: 10.9 g/dL — ABNORMAL LOW (ref 12.0–15.0)
Immature Granulocytes: 1 %
Lymphocytes Relative: 8 %
Lymphs Abs: 1 10*3/uL (ref 0.7–4.0)
MCH: 30.5 pg (ref 26.0–34.0)
MCHC: 31.1 g/dL (ref 30.0–36.0)
MCV: 98 fL (ref 80.0–100.0)
Monocytes Absolute: 0.6 10*3/uL (ref 0.1–1.0)
Monocytes Relative: 4 %
Neutro Abs: 11.3 10*3/uL — ABNORMAL HIGH (ref 1.7–7.7)
Neutrophils Relative %: 85 %
Platelets: 167 10*3/uL (ref 150–400)
RBC: 3.57 MIL/uL — ABNORMAL LOW (ref 3.87–5.11)
RDW: 14.9 % (ref 11.5–15.5)
WBC: 13.3 10*3/uL — ABNORMAL HIGH (ref 4.0–10.5)
nRBC: 0 % (ref 0.0–0.2)

## 2020-07-29 LAB — COMPREHENSIVE METABOLIC PANEL
ALT: 59 U/L — ABNORMAL HIGH (ref 0–44)
AST: 44 U/L — ABNORMAL HIGH (ref 15–41)
Albumin: 2.4 g/dL — ABNORMAL LOW (ref 3.5–5.0)
Alkaline Phosphatase: 73 U/L (ref 38–126)
Anion gap: 9 (ref 5–15)
BUN: 28 mg/dL — ABNORMAL HIGH (ref 8–23)
CO2: 30 mmol/L (ref 22–32)
Calcium: 8.7 mg/dL — ABNORMAL LOW (ref 8.9–10.3)
Chloride: 102 mmol/L (ref 98–111)
Creatinine, Ser: 1.02 mg/dL — ABNORMAL HIGH (ref 0.44–1.00)
GFR, Estimated: 58 mL/min — ABNORMAL LOW (ref >60)
Glucose, Bld: 93 mg/dL (ref 70–99)
Potassium: 3.6 mmol/L (ref 3.5–5.1)
Sodium: 141 mmol/L (ref 135–145)
Total Bilirubin: 0.6 mg/dL (ref 0.3–1.2)
Total Protein: 5.5 g/dL — ABNORMAL LOW (ref 6.5–8.1)

## 2020-07-29 LAB — GLUCOSE, CAPILLARY
Glucose-Capillary: 219 mg/dL — ABNORMAL HIGH (ref 70–99)
Glucose-Capillary: 94 mg/dL (ref 70–99)
Glucose-Capillary: 181 mg/dL — ABNORMAL HIGH (ref 70–99)
Glucose-Capillary: 182 mg/dL — ABNORMAL HIGH (ref 70–99)
Glucose-Capillary: 256 mg/dL — ABNORMAL HIGH (ref 70–99)

## 2020-07-29 MED ORDER — DOCUSATE SODIUM 100 MG PO CAPS
100.0000 mg | ORAL_CAPSULE | Freq: Two times a day (BID) | ORAL | Status: DC
  Administered 2020-07-29 – 2020-08-14 (×33): 100 mg via ORAL
  Filled 2020-07-29 (×33): qty 1

## 2020-07-29 MED ORDER — CHLORHEXIDINE GLUCONATE CLOTH 2 % EX PADS
6.0000 | MEDICATED_PAD | Freq: Every day | CUTANEOUS | Status: DC
  Administered 2020-07-29 – 2020-08-06 (×9): 6 via TOPICAL

## 2020-07-29 NOTE — Progress Notes (Signed)
Placed patient on home CPAP unit.

## 2020-07-29 NOTE — Progress Notes (Signed)
Patient Details  Name: Stacie [name] MRN: [id number] Date of Birth: 22-Sep-1956  Today's Date: 07/29/2020  Hospital Problems: Active Problems:   Sepsis [facility])  Past Medical History:  Past Medical History:  Diagnosis Date  . Arrhythmia    patient unaware if this is current  . Asthma   . GERD (gastroesophageal reflux disease)   . Heart murmur   . History of kidney stones   . HOH (hard of hearing)    wear aids  . Hypothyroidism   . IBS (irritable bowel syndrome)   . Pulmonary hypertension ([place])   . Sarcoid   . Sarcoidosis   . Seasonal allergies   . Sleep apnea CPAP with O2  . Wears hearing aid in both ears    Past Surgical History:  Past Surgical History:  Procedure Laterality Date  . CARDIAC CATHETERIZATION  10/18/2018   [facility]  . CATARACT EXTRACTION W/PHACO Left 07/31/2019   Procedure: CATARACT EXTRACTION PHACO AND INTRAOCULAR LENS PLACEMENT (IOC) LEFT 00:51.1  17.9%  9.15;  Surgeon: Leandrew Koyanagi, MD;  Location: [place];  Service: Ophthalmology;  Laterality: Left;  keep this patient second  . COLON SURGERY     "colon was fused to bladder - operated on both"  . COLONOSCOPY  09/18/2007   diverticuli, no polyps  . COLONOSCOPY  05/26/2010   diverticuli, no polyps  . CYSTOSCOPY WITH STENT PLACEMENT Bilateral 07/14/2020   Procedure: CYSTOSCOPY WITH STENT PLACEMENT, RETROPYLOGRAM;  Surgeon: Billey Co, MD;  Location: [facility];  Service: Urology;  Laterality: Bilateral;  . CYSTOSCOPY/URETEROSCOPY/HOLMIUM LASER/STENT PLACEMENT Left 02/20/2020   Procedure: CYSTOSCOPY/URETEROSCOPY/LITHOTRIPSY /STENT PLACEMENT;  Surgeon: Hollice Espy, MD;  Location: [facility];  Service: Urology;  Laterality: Left;  . PARS PLANA VITRECTOMY Right 05/20/2015   Procedure: PARS PLANA VITRECTOMY WITH 25 GAUGE, laser;  Surgeon: Milus Height, MD;  Location: [facility];  Service: Ophthalmology;  Laterality: Right;  . PARTIAL HYSTERECTOMY  1990  . TEE WITHOUT CARDIOVERSION N/A  07/27/2020   Procedure: TRANSESOPHAGEAL ECHOCARDIOGRAM (TEE);  Surgeon: Teodoro Spray, MD;  Location: [facility];  Service: Cardiovascular;  Laterality: N/A;  . TUBAL LIGATION     Social History:  reports that she has never smoked. She has never used smokeless tobacco. She reports that she does not drink alcohol and does not use drugs.  Family / Support Systems Patient Roles: Spouse Spouse/Significant Other: Dr. Margaretha Sheffield Children: Stacie [name] (Daughter); 52 Other Supports: Stacie [name] (Daughter-in-law), Stacie [name] (friend/neighbor) Anticipated Caregiver: Stacie [name] Ability/Limitations of Caregiver: none-healing back issues Caregiver Availability: 24/7 (Daughter plans to move back home)  Social History Preferred language: English Religion: Christian Read: Yes Write: Yes Employment Status: Unemployed   Abuse/Neglect Abuse/Neglect Assessment Can Be Completed: Yes Physical Abuse: Denies Verbal Abuse: Denies Sexual Abuse: Denies Exploitation of patient/patient's resources: Denies Self-Neglect: Denies  Emotional Status Pt's affect, behavior and adjustment status: Spouse feels patient is motivated and ready to get back home Recent Psychosocial Issues: no Psychiatric History: no Substance Abuse History: no  Patient / Family Perceptions, Expectations & Goals Pt/Family understanding of illness & functional limitations: Dr Eddie Dibbles reports feeling updated and was able to meet some staff this morning. Premorbid pt/family roles/activities: Pt active in community. Independent Anticipated changes in roles/activities/participation: Daughter will asisst in home Pt/family expectations/goals: Goal to [place]: None Premorbid Home Care/DME Agencies: Other (Comment) (02 Concentrator. 2L Previously) Transportation available at discharge: Family able to transport Resource referrals recommended: Neuropsychology  Discharge Planning Living Arrangements:  Spouse/significant other Support  Systems: Children, Spouse/significant other Type of Residence: Private residence (2 Level home(16 steps), able to stay on main level with bed and half bath) Insurance Resources: [employer]imedia programmer (specify) Nurse, mental health) Money Management: Patient Does the patient have any problems obtaining your medications?: No Home Management: Independent Care Coordinator Anticipated Follow Up Needs: HH/OP Expected length of stay: 18-21 Days  Clinical Impression SW entered room introduced self, explained role and progress. Called spouse at bedside provided same information. No questions or concerns, sw will continue to follow up.   Dyanne Iha 07/29/2020, 1:47 PM

## 2020-07-29 NOTE — Progress Notes (Signed)
Therapy reported pt RR where between 28-34 and SATs low 90s.  Assessed pt, pt reports that feels she is breathing better at the moment. Notified PA, no new orders at this time.   SCDs placed at this time as well. Will continue with plan of care.

## 2020-07-29 NOTE — Patient Care Conference (Signed)
Inpatient RehabilitationTeam Conference and Plan of Care Update Date: 07/29/2020   Time: 2:47 PM    Patient Name: Stacie [name]      Medical Record Number: [id number]  Date of Birth: [birth year] Sex: Female         Room/Bed: 4W21C/4W21C-01 Payor Info: Payor: [place] / Plan: BCBS COMM PPO / Product Type: *No Product type* /    Admit Date/Time:  07/28/2020  3:13 PM  Primary Diagnosis:  <principal problem not specified>  Hospital Problems: Active Problems:   Sepsis [facility])    Expected Discharge Date: Expected Discharge Date:  (2-3 weeks)  Team Members Present: Physician leading conference: Dr. Alysia Penna Care Coodinator Present: Dorien Chihuahua, RN, BSN, CRRN;Christina Hubbard, BSW Nurse Present: Isla Pence, RN PT Present: Barrie Folk, PT OT Present: Lillia Corporal, OT SLP Present: Jettie Booze, CF-SLP PPS Coordinator present : Gunnar Fusi, Novella Olive, PT     Current Status/Progress Goal Weekly Team Focus  Bowel/Bladder   Pt continent of bowel with LBM 07/28/2020. Foley catheter in place for urinary retention     Assess B/B every shift and PRN   Swallow/Nutrition/ Hydration             ADL's   Max to total LB ADLs at bed level d/t extreme deconditioning, Foley, unclear if continent of BM, Mod-Max bed mobility, transfers TBD  Supervision transfers, Min ADLs  endurance!!!!, sitting balance/core, progress to ADLs at sink level, RUE NMR (mild)   Mobility   Mod rolling L/R with increased dizziness rolling to L, Supine to sit Max x1  eval pending      Communication             Safety/Cognition/ Behavioral Observations  eval pending         Pain   Pt denies pain  Pain less than 3  Assess pain every shift and PRN   Skin   BLacked toes toes to BL feet  No new skin breakdown  Assess skin every shift and PRN     Discharge Planning:  Goal to discharge home with spouse and daughter to provide 24/7 care. 15 steps, L side railings, walk-in shower.  Able to set up on 1st level with bed and bath   Team Discussion: Gangrene bil feet: vascular following up outpatient. Multiple co-moribidities, bil white matter strokes with LE weakness/debility vs stroke related deficits. Monitor for tone in LE to flush out cause for LE weakness. Very deconditioned. Chronic SOB 2/2 sarcoidosis worsended over recent past even with O2 @ 2l/min [place]. Foley in place for urinary retention on flomax; voiding trial post foley removal pending.  Patient on target to meet rehab goals: yes, currently min mod for UB care and max-total for LB.  *See Care Plan and progress notes for long and short-term goals.   Revisions to Treatment Plan:   Teaching Needs: Transfers, toileting, skin care, medications, etc.  Current Barriers to Discharge: Decreased caregiver support, New diabetic and Neurogenic bowel and bladder  Possible Resolutions to Barriers: Family education with spouse and daughter     Medical Summary Current Status: very poor endurance. chronic O2, chronic immunosuppression  Barriers to Discharge: Medical stability   Possible Resolutions to [employer] Focus: initiate rehab program, monitor skin   Continued Need for Acute Rehabilitation Level of Care: The patient requires daily medical management by a physician with specialized training in physical medicine and rehabilitation for the following reasons: Direction of a multidisciplinary physical rehabilitation program to maximize functional independence :  Yes Medical management of patient stability for increased activity during participation in an intensive rehabilitation regime.: Yes Analysis of laboratory values and/or radiology reports with any subsequent need for medication adjustment and/or medical intervention. : Yes   I attest that I was present, lead the team conference, and concur with the assessment and plan of the team.   Dorien Chihuahua B 07/29/2020, 2:47 PM

## 2020-07-29 NOTE — Progress Notes (Signed)
Inpatient Rehabilitation  Patient information reviewed and entered into eRehab system by Merrilyn Legler M. Lundy Cozart, M.A., CCC/SLP, PPS Coordinator.  Information including medical coding, functional ability and quality indicators will be reviewed and updated through discharge.    

## 2020-07-29 NOTE — Progress Notes (Signed)
Patient ID: Stacie [name], female   DOB: January 17, 1956, 64 y.o.   MRN: [id number] Team Conference Report to Patient/Family  Team Conference discussion was reviewed with the patient and caregiver, including goals, any changes in plan of care and target discharge date.  Patient and caregiver express understanding and are in agreement.  The patient has a target discharge date of  (2-3 weeks).  Stacie [name] 07/29/2020, 1:42 PM

## 2020-07-29 NOTE — Progress Notes (Addendum)
[place] PHYSICAL MEDICINE & REHABILITATION PROGRESS NOTE   Subjective/Complaints: No toe pain, mild SOB with activity  ROS- neg CP, + urinary retention, neg N/V/D   Objective:   ECHO TEE  Result Date: 07/28/2020    TRANSESOPHOGEAL ECHO REPORT   Patient Name:   Stacie [name] Date of Exam: 07/27/2020 Medical Rec #:  [id number]         Height:       67.5 in Accession #:    [id number]        Weight:       179.0 lb Date of Birth:  02/12/1956          BSA:          1.940 m Patient Age:    64 years          BP:           84/40 mmHg Patient Gender: F                 HR:           95 bpm. Exam Location:  [facility] Procedure: Transesophageal Echo, Cardiac Doppler and Color Doppler Indications:     Not listed on check-in order  History:         Patient has prior history of Echocardiogram examinations, most                  recent 07/16/2020. Signs/Symptoms:Murmur.  Sonographer:     Sherrie Sport RDCS (AE) Referring Phys:  [id number] Teodoro Spray Diagnosing Phys: Bartholome Bill MD PROCEDURE: The transesophogeal probe was passed without difficulty through the esophogus of the patient. Sedation performed by different physician. Patients was under conscious sedation during this procedure. The patient developed no complications during  the procedure. IMPRESSIONS  1. Left ventricular ejection fraction, by estimation, is 60 to 65%. The left ventricle has normal function. The left ventricle has no regional wall motion abnormalities.  2. Right ventricular systolic function was not well visualized. The right ventricular size is not well visualized.  3. La appendage not well visualized but la blood flow was brisk and no smoke noted. . No left atrial/left atrial appendage thrombus was detected.  4. Right atrial size was mildly dilated.  5. The mitral valve is grossly normal. Trivial mitral valve regurgitation.  6. The aortic valve is tricuspid. Aortic valve regurgitation is not visualized. FINDINGS  Left Ventricle: Left ventricular  ejection fraction, by estimation, is 60 to 65%. The left ventricle has normal function. The left ventricle has no regional wall motion abnormalities. The left ventricular internal cavity size was normal in size. Right Ventricle: The right ventricular size is not well visualized. Right vetricular wall thickness was not assessed. Right ventricular systolic function was not well visualized. Left Atrium: La appendage not well visualized but la blood flow was brisk and no smoke noted. Left atrial size was normal in size. No left atrial/left atrial appendage thrombus was detected. Right Atrium: Right atrial size was mildly dilated. Pericardium: There is no evidence of pericardial effusion. Mitral Valve: The mitral valve is grossly normal. Trivial mitral valve regurgitation. There is no evidence of mitral valve vegetation. Tricuspid Valve: The tricuspid valve is grossly normal. Tricuspid valve regurgitation is trivial. There is no evidence of tricuspid valve vegetation. Aortic Valve: The aortic valve is tricuspid. Aortic valve regurgitation is not visualized. There is no evidence of aortic valve vegetation. Pulmonic Valve: The pulmonic valve was not well visualized. Pulmonic valve regurgitation is trivial.  Aorta: The aortic root is normal in size and structure. IAS/Shunts: No atrial level shunt detected by color flow Doppler. Agitated saline contrast was given intravenously to evaluate for intracardiac shunting. Bartholome Bill MD Electronically signed by Bartholome Bill MD Signature Date/Time: 07/28/2020/4:14:51 PM    Final    Recent Labs    07/28/20 0425 07/28/20 1536  WBC 13.6* 14.0*  HGB 10.7* 10.9*  HCT 34.5* 35.4*  PLT 178 170   Recent Labs    07/28/20 0425 07/28/20 1536  NA 141  --   K 4.4  --   CL 104  --   CO2 29  --   GLUCOSE 146*  --   BUN 36*  --   CREATININE 1.13* 1.14*  CALCIUM 8.4*  --     Intake/Output Summary (Last 24 hours) at 07/29/2020 0642 Last data filed at 07/29/2020 0300 Gross  per 24 hour  Intake 237 ml  Output 2600 ml  Net -2363 ml        Physical Exam: Vital Signs Blood pressure (!) 147/65, pulse 84, temperature 97.7 F (36.5 C), resp. rate 20, height _0  (1.727 m), weight 82.7 kg, SpO2 98 %.   General: No acute distress Mood and affect are appropriate Heart: Regular rate and rhythm no rubs murmurs or extra sounds Lungs: Clear to auscultation, breathing unlabored, no rales or wheezes Abdomen: Positive bowel sounds, soft nontender to palpation, nondistended Extremities: No clubbing, cyanosis, or edema Skin: No evidence of breakdown, no evidence of rash Neurologic: Cranial nerves II through XII intact, motor strength is 5/5 in bilateral deltoid, bicep, tricep, grip,  2- Bilat HF, 3- B KE, ADF/PF   Musculoskeletal:pain free active assisted  range of motion in all 4 extremities. No joint swelling   Assessment/Plan: 1. Functional deficits secondary to debility post urosepsis which require 3+ hours per day of interdisciplinary therapy in a comprehensive inpatient rehab setting.  Physiatrist is providing close team supervision and 24 hour management of active medical problems listed below.  Physiatrist and rehab team continue to assess barriers to discharge/monitor patient progress toward functional and medical goals  Care Tool:  Bathing              Bathing assist       Upper Body Dressing/Undressing Upper body dressing        Upper body assist      Lower Body Dressing/Undressing Lower body dressing            Lower body assist       Toileting Toileting    Toileting assist Assist for toileting: Total Assistance - Patient < 25% (Foley care provided.)     Transfers Chair/bed transfer  Transfers assist           Locomotion Ambulation   Ambulation assist              Walk 10 feet activity   Assist           Walk 50 feet activity   Assist           Walk 150 feet activity   Assist            Walk 10 feet on uneven surface  activity   Assist           Wheelchair     Assist               Wheelchair 50 feet with 2 turns activity    Assist  Wheelchair 150 feet activity     Assist          Blood pressure (!) 147/65, pulse 84, temperature 97.7 F (36.5 C), resp. rate 20, height _0  (1.727 m), weight 82.7 kg, SpO2 98 %.  Medical Problem List and Plan: 1.  Altered mental status with decreased functional mobility secondary to sepsis/encephalopathy patient completed 112 days of ceftriaxone as well as recent course of Rocephin             -patient may shower             -ELOS/Goals: minA 20-24 days Team conference today please see physician documentation under team conference tab, met with team  to discuss problems,progress, and goals. Formulized individual treatment plan based on medical history, underlying problem and comorbidities.  2.  Antithrombotics: -DVT/anticoagulation: Subcutaneous heparin             -antiplatelet therapy: Aspirin 81 mg daily 3. Pain Management: Tylenol as needed. Well controlled.  4. Mood: Lexapro 20 mg daily             -antipsychotic agents: N/A 5. Neuropsych: This patient is capable of making decisions on her own behalf. 6. Skin/Wound Care: Routine skin checks Cyanotic left toes #1,2,3, and R #1, plan for VVS is to monitor  Demarcation and f/u as OP  unchanged vs image from 10/13 7. Fluids/Electrolytes/Nutrition: Routine in and outs with follow-up chemistries 8.  Pulmonary sarcoidosis.  Patient on 2 L nasal cannula baseline.  Follow-up outpatient 9.  Hypertension.  Monitor with increased mobility. Well controlled.  10.  Asthma.  Continue inhalers as directed 11.  Hyperlipidemia.  Lipitor 12.  Hypothyroidism.  Synthroid 13.  AKI.  Follow-up chemistries 14.  Bilateral lower extremity ischemia.  No urgent indication for lower extremity angiogram at present.  Follow-up vascular surgery. 15.   Urinary retention.  DC foley and initiate voiding trial    LOS: 1 days A FACE TO FACE EVALUATION WAS PERFORMED  Charlett Blake 07/29/2020, 6:42 AM

## 2020-07-29 NOTE — Evaluation (Signed)
Occupational Therapy Assessment and Plan  Patient Details  Name: Stacie [name] MRN: [id number] Date of Birth: 1955/12/02  OT Diagnosis: abnormal posture, apraxia, ataxia, hemiplegia affecting dominant side, muscular wasting and disuse atrophy and muscle weakness (generalized) Rehab Potential:   ELOS: 24-28 days   Today's Date: 07/29/2020 OT Individual Time: 7371-0626 OT Individual Time Calculation (min): 47 min  and Today's Date: 07/29/2020 OT Missed Time: 13 Minutes Missed Time Reason: Other (comment) (pt getting breathing treatment with respiratory)    Hospital Problem: Active Problems:   Sepsis [facility])   Past Medical History:  Past Medical History:  Diagnosis Date  . Arrhythmia    patient unaware if this is current  . Asthma   . GERD (gastroesophageal reflux disease)   . Heart murmur   . History of kidney stones   . HOH (hard of hearing)    wear aids  . Hypothyroidism   . IBS (irritable bowel syndrome)   . Pulmonary hypertension ([place])   . Sarcoid   . Sarcoidosis   . Seasonal allergies   . Sleep apnea CPAP with O2  . Wears hearing aid in both ears    Past Surgical History:  Past Surgical History:  Procedure Laterality Date  . CARDIAC CATHETERIZATION  10/18/2018   [facility]  . CATARACT EXTRACTION W/PHACO Left 07/31/2019   Procedure: CATARACT EXTRACTION PHACO AND INTRAOCULAR LENS PLACEMENT (IOC) LEFT 00:51.1  17.9%  9.15;  Surgeon: Leandrew Koyanagi, MD;  Location: [place];  Service: Ophthalmology;  Laterality: Left;  keep this patient second  . COLON SURGERY     "colon was fused to bladder - operated on both"  . COLONOSCOPY  09/18/2007   diverticuli, no polyps  . COLONOSCOPY  05/26/2010   diverticuli, no polyps  . CYSTOSCOPY WITH STENT PLACEMENT Bilateral 07/14/2020   Procedure: CYSTOSCOPY WITH STENT PLACEMENT, RETROPYLOGRAM;  Surgeon: Billey Co, MD;  Location: [facility];  Service: Urology;  Laterality: Bilateral;  .  CYSTOSCOPY/URETEROSCOPY/HOLMIUM LASER/STENT PLACEMENT Left 02/20/2020   Procedure: CYSTOSCOPY/URETEROSCOPY/LITHOTRIPSY /STENT PLACEMENT;  Surgeon: Hollice Espy, MD;  Location: [facility];  Service: Urology;  Laterality: Left;  . PARS PLANA VITRECTOMY Right 05/20/2015   Procedure: PARS PLANA VITRECTOMY WITH 25 GAUGE, laser;  Surgeon: Milus Height, MD;  Location: [facility];  Service: Ophthalmology;  Laterality: Right;  . PARTIAL HYSTERECTOMY  1990  . TEE WITHOUT CARDIOVERSION N/A 07/27/2020   Procedure: TRANSESOPHAGEAL ECHOCARDIOGRAM (TEE);  Surgeon: Teodoro Spray, MD;  Location: [facility];  Service: Cardiovascular;  Laterality: N/A;  . TUBAL LIGATION      Assessment & Plan Clinical Impression: Stacie [name] is a 64 year old right-handed female with history of arrhythmia, hypertension, asthma, hypothyroidism hard of hearing with hearing aids, pulmonary sarcoidosis on 2 L nasal cannula at baseline  maintained methotrexate as well as chronic prednisone, OSA, IBS, chronic kidney disease with creatinine 1.04-1.11.  Per chart review patient lives with spouse who is a Surveyor, quantity.  Two-level home bed and bath upstairs.  Independent with assistive device.  Presented 07/09/2020 with altered mental status and shortness of breath.  Cranial CT scan showed chronic white matter ischemic changes without acute abnormality.  CT of the chest showed chronic changes of pulmonary sarcoidosis with biapical predominant pulmonary fibrosis.  Very mild superimposed active inflammatory infiltrate.  Ultrasound of the abdomen showed liver echogenicity overall increased in mildly inhomogeneous.  Appearance most likely indicate of of hepatic steatosis.  No focal liver lesions evident.  Admission chemistries BUN 37, creatinine 4.16, lactic acid 5.8,  urinalysis negative nitrite, urine culture greater 100,000 Klebsiella, troponin 88, hemoglobin 12.2, WBC 32,900, blood cultures enterobacterales and Klebsiella.  Placed on  broad-spectrum antibiotics presently on Rocephin for suspect sepsis secondary to community-acquired pneumonia and UTI through 07/28/2020 and stop.  Renal service is consulted for AKI placed on lactated Ringer's.  Renal ultrasound showed no hydronephrosis.  Small volume right upper quadrant ascites.  No indications for hemodialysis.  Infectious disease consulted 07/13/2020 for Klebsiella bacteremia and maintained on ceftriaxone x112 days that has since been completed.  CT of abdomen completed to look for renal/ureteric stone that showed 7 to 8 mm proximal right ureteral stone with obstructive change and perinephric stranding.  Minimal free fluid within the abdomen and pelvis.  Urology consulted underwent cystoscopy as well as bilateral retrograde pyelogram with right and left ureteral stent placement 07/14/2020.  Patient would need bilateral outpatient ureteroscopy, laser lithotripsy and stent exchange in 4 to 8 weeks.  Patient did remain intubated for a short time.  Neurology consulted 07/16/2020 for right side weakness and slurred speech.  MRI showed severely limited evaluation with multiple sequences.  Diffusion-weighted imaging significantly limited with multiple small areas of DWI hyperintensity in the bilateral white matter representing artifact versus small infarcts.  CT angiogram of head and neck limited with no evidence of large vessel occlusion or significant arterial stenosis.  MRI later repeated 07/21/2020 unchanged DWI from prior imaging with small vague foci of trace diffusion abnormality suggesting scattered small foci of subacute white matter ischemia.  Underlying advanced but nonspecific chronic signal changes in the cerebral white matter in the pons redemonstrated most commonly due to small vessel disease.  EEG showed some background slowing with rare triphasic waves noted no seizure activity.  Echocardiogram with ejection fraction of 30 to 35% the left ventricle demonstrating global hypokinesis.  Echo  bubble study no evidence of thrombus and grade 3 diastolic dysfunction.  TEE results pending.  Podiatry as well as vascular surgery consulted for necrosis dry gangrene of both feet left greater than right 07/20/2020 with ischemic changes.  Vascular surgery did not feel any CT angiogram or intervention was needed and advised to continue to follow.  HIT had been ruled out.  Currently maintained on subcutaneous heparin for DVT prophylaxis. Patient transferred to CIR on 07/28/2020 .    Patient currently requires max with basic self-care skills secondary to muscle weakness, decreased cardiorespiratoy endurance, impaired timing and sequencing, unbalanced muscle activation, ataxia and decreased motor planning, decreased motor planning and decreased sitting balance, decreased standing balance, decreased postural control, hemiplegia and decreased balance strategies.  Prior to hospitalization, patient could complete BADLs with modified independent .  Patient will benefit from skilled intervention to decrease level of assist with basic self-care skills and increase independence with basic self-care skills prior to discharge home with care partner.  Anticipate patient will require intermittent supervision and follow up home health.  OT - End of Session Activity Tolerance: Tolerates < 10 min activity, no significant change in vital signs Endurance Deficit: Yes Endurance Deficit Description: fatigues very quickly during ADL OT Assessment OT Barriers to Discharge: Home environment access/layout OT Barriers to Discharge Comments: 1/2 bath main floor, showers upstairs OT Patient demonstrates impairments in the following area(s): Balance;Endurance;Motor;Safety;Sensory;Skin Integrity OT Basic ADL's Functional Problem(s): Grooming;Bathing;Dressing;Toileting OT Transfers Functional Problem(s): Toilet;Tub/Shower OT Additional Impairment(s): Fuctional Use of Upper Extremity OT Plan OT Intensity: Minimum of 1-2 x/day, 45  to 90 minutes OT Frequency: 5 out of 7 days OT Duration/Estimated Length of Stay: 24-28 days  OT Treatment/Interventions: Balance/vestibular training;Discharge planning;Functional electrical stimulation;Self Care/advanced ADL retraining;Pain management;Therapeutic Activities;UE/LE Coordination activities;Visual/perceptual remediation/compensation;Therapeutic Exercise;Patient/family education;Functional mobility training;Disease mangement/prevention;Cognitive remediation/compensation;Skin care/wound managment;Community reintegration;DME/adaptive equipment instruction;Neuromuscular re-education;Psychosocial support;UE/LE Strength taining/ROM;Wheelchair propulsion/positioning OT Self Feeding Anticipated Outcome(s): not addressed OT Basic Self-Care Anticipated Outcome(s): Min A overall OT Toileting Anticipated Outcome(s): Min A OT Bathroom Transfers Anticipated Outcome(s): CGA OT Recommendation Patient destination: Home Follow Up Recommendations: Home health OT Equipment Recommended: To be determined   OT Evaluation Precautions/Restrictions  Precautions Precautions: Fall Precaution Comments: monitor O2, 2L continuous Restrictions Weight Bearing Restrictions: No General OT Amount of Missed Time: 13 Minutes Vital Signs Therapy Vitals Pulse Rate: 97 Resp: 20 Patient Position (if appropriate): Lying Oxygen Therapy SpO2: 95 % O2 Device: Nasal Cannula O2 Flow Rate (L/min): 3 L/min FiO2 (%): 32 % Pain Pain Assessment Pain Scale: 0-10 Pain Score: 0-No pain Home Living/Prior Functioning Home Living Living Arrangements: Spouse/significant other Available Help at Discharge: Family Type of Home: House Home Access: Stairs to enter Technical brewer of Steps: 2 Home Layout: Two level, Able to live on main level with bedroom/bathroom (able to convert dining room into bedroom if needed, 1/2 bath on main) Alternate Level Stairs-Number of Steps: 15 Bathroom Shower/Tub: Tub/shower unit,  Gaffer (both upstairs) Technical brewer Accessibility: Yes  Lives With: Spouse Prior Function Level of Independence: Independent with basic ADLs, Independent with transfers  Able to Take Stairs?: Yes Comments: Mod I with ADLs, household and limited community mobilization; home O2 at 2L. Vision Patient Visual Report: No change from baseline Perception  Perception: Impaired Praxis Praxis: Impaired Cognition Overall Cognitive Status: Within Functional Limits for tasks assessed Arousal/Alertness: Awake/alert Orientation Level: Person;Place;Situation Person: Oriented Place: Oriented Situation: Oriented Year: 2021 Month: October Day of Week: Correct Immediate Memory Recall: Sock;Blue;Bed Memory Recall Sock: Without Cue Memory Recall Blue: Without Cue Memory Recall Bed: Without Cue Sustained Attention: Impaired (limited by fatigue) Awareness: Impaired Safety/Judgment: Appears intact Sensation Sensation Light Touch: Impaired by gross assessment Coordination Gross Motor Movements are Fluid and Coordinated: No Fine Motor Movements are Fluid and Coordinated: No Coordination and Movement Description: generalized weakness and deconditioning limiting, some R side weakness with LE>UE Motor  Motor Motor: Hemiplegia;Abnormal postural alignment and control Motor - Skilled Clinical Observations: very deconditioned, weakness limiting, decreased RUE coordination  Trunk/Postural Assessment  Cervical Assessment Cervical Assessment: Exceptions to [facility] (FWD head) Thoracic Assessment Thoracic Assessment: Exceptions to [facility] (kyphotic posture/rounded shoulders) Lumbar Assessment Lumbar Assessment: Exceptions to [facility] (posterior pelvic tilt at EOB) Postural Control Postural Control: Deficits on evaluation  Balance Balance Balance Assessed: Yes Static Sitting Balance Static Sitting - Balance Support: Bilateral upper extremity supported Static Sitting - Level of  Assistance: 5: Stand by assistance Dynamic Sitting Balance Dynamic Sitting - Balance Support: Left upper extremity supported;During functional activity Dynamic Sitting - Level of Assistance: 4: Min assist Sitting balance - Comments: SBA static sitting, increased assist with functional tasks d/t fatigue and decreased core strength Extremity/Trunk Assessment RUE Assessment RUE Assessment: Exceptions to [facility] Active Range of Motion (AROM) Comments: WFL General Strength Comments: 3+ to 4-/5 roughly, ROM WFL but decreased strength and coordination LUE Assessment LUE Assessment: Within Functional Limits General Strength Comments: generalized weakness but functional  Care Tool Care Tool Self Care Eating    Set up    Oral Care     Supervision    Bathing   Body parts bathed by patient: Right arm;Left arm;Chest;Abdomen;Right upper leg;Left upper leg;Face Body parts bathed by helper: Front perineal area;Buttocks;Right lower leg;Left lower leg  Assist Level: Maximal Assistance - Patient 24 - 49%    Upper Body Dressing(including orthotics)   What is the patient wearing?: Pull over shirt   Assist Level: Moderate Assistance - Patient 50 - 74%    Lower Body Dressing (excluding footwear)   What is the patient wearing?: Underwear/pull up;Pants Assist for lower body dressing: Total Assistance - Patient < 25%    Putting on/Taking off footwear   What is the patient wearing?: [place] for footwear: Dependent - Patient 0%       Care Tool Toileting Toileting activity Toileting Activity did not occur (Clothing management and hygiene only): N/A (no void or bm) Assist for toileting:  (Foley for urine, no BM at eval)     Care Tool Bed Mobility Roll left and right activity   Roll left and right assist level: Moderate Assistance - Patient 50 - 74%    Sit to lying activity    Max A    Lying to sitting edge of bed activity    Max A     Care Tool Transfers Sit to stand transfer    not  attempted     Chair/bed transfer    not attempted d/t safety     Toilet transfer    not attempted d/t safety      Care Tool Cognition Expression of Ideas and Wants Expression of Ideas and Wants: Without difficulty (complex and basic) - expresses complex messages without difficulty and with speech that is clear and easy to understand   Understanding Verbal and Non-Verbal Content Understanding Verbal and Non-Verbal Content: Understands (complex and basic) - clear comprehension without cues or repetitions   Memory/Recall Ability *first 3 days only Memory/Recall Ability *first 3 days only: Current season;Location of own room;Staff names and faces;That he or she is in a hospital/hospital unit    Refer to Care Plan for [place] 1 OT Short Term Goal 1 (Week 1): Pt will participate in seated ADL activity for 10 mins without fatigue or drop in O2 OT Short Term Goal 2 (Week 1): Pt will perform UB ADLs at EOB/sink with CGA OT Short Term Goal 3 (Week 1): Pt will perform LB dress with AE PRN with Mod A OT Short Term Goal 4 (Week 1): Pt will perform toilet transfer with Max of 1  Recommendations for other services: None    Skilled Interventions: Pt greeted at time of session reclined in bed supine resting, husband present at beginning of session. Discussed CIR and what to expect for the duration of her stay as well as plan and purpose of OT.   Pt performed bed mobility Mod A to sit EOB to manage RLE and trunk elevation, max A to scoot to EOB. Pt remained on O2 at 2L throughout session which is her baseline. Husband assisted with answering questions regarding home set up and assistance provided at home. UB/LB bathing at EOB with Mod A for UB bathing and Max A for LB bathing, performed at bed level. Pt fatigues very quickly but sats remained in high 80s/low 90s with activity. Pt c/o neck discomfort with sitting for too long as well as fatigue, returned to supine with Max A.  Donned shirt with Mod A, pt able to thread arms and overhead with assist pulling down rolling at bed level. LB dressing for brief and shorts with total A, pt able to roll L and R with Mod A to assist. Pt very fatigued, set up  with all needs met and alarm on, call bell in reach.     Skilled Therapeutic Intervention ADL ADL Upper Body Bathing: Moderate assistance Where Assessed-Upper Body Bathing: Edge of bed Lower Body Bathing: Maximal assistance Where Assessed-Lower Body Bathing: Edge of bed Upper Body Dressing: Moderate assistance Where Assessed-Upper Body Dressing: Edge of bed Lower Body Dressing: Dependent Where Assessed-Lower Body Dressing: Bed level ADL Comments: bed level ADLs at eval d/t decreased endurance/tolerance Mobility  Bed Mobility Bed Mobility: Rolling Right;Rolling Left;Sitting - Scoot to Edge of Bed Rolling Right: Moderate Assistance - Patient 50-74% Rolling Left: Moderate Assistance - Patient 50-74% Supine to Sit: Maximal Assistance - Patient - Patient 25-49%   Discharge Criteria: Patient will be discharged from OT if patient refuses treatment 3 consecutive times without medical reason, if treatment goals not met, if there is a change in medical status, if patient makes no progress towards goals or if patient is discharged from hospital.  The above assessment, treatment plan, treatment alternatives and goals were discussed and mutually agreed upon: by patient  Viona Gilmore 07/29/2020, 12:29 PM

## 2020-07-29 NOTE — H&P (Signed)
[name]ICINE [facility]ABILITATION PROGRESS NOTE   Subjective/Complaints: No toe pain, mild SOB with activity  ROS- neg CP, + urinary retention, neg N/V/D   Objective:   No results found. Recent Labs    07/28/20 1536 07/29/20 0709  WBC 14.0* 13.3*  HGB 10.9* 10.9*  HCT 35.4* 35.0*  PLT 170 167   Recent Labs    07/28/20 0425 07/28/20 0425 07/28/20 1536 07/29/20 0709  NA 141  --   --  141  K 4.4  --   --  3.6  CL 104  --   --  102  CO2 29  --   --  30  GLUCOSE 146*  --   --  93  BUN 36*  --   --  28*  CREATININE 1.13*   < > 1.14* 1.02*  CALCIUM 8.4*  --   --  8.7*   < > = values in this interval not displayed.    Intake/Output Summary (Last 24 hours) at 07/29/2020 1236 Last data filed at 07/29/2020 0921 Gross per 24 hour  Intake 355 ml  Output 2600 ml  Net -2245 ml        Physical Exam: Vital Signs Blood pressure (!) 147/65, pulse 97, temperature 97.7 F (36.5 C), resp. rate 20, height _0  (1.727 m), weight 82.7 kg, SpO2 95 %.   General: No acute distress Mood and affect are appropriate Heart: Regular rate and rhythm no rubs murmurs or extra sounds Lungs: Clear to auscultation, breathing unlabored, no rales or wheezes Abdomen: Positive bowel sounds, soft nontender to palpation, nondistended Extremities: No clubbing, cyanosis, or edema Skin: No evidence of breakdown, no evidence of rash Neurologic: Cranial nerves II through XII intact, motor strength is 5/5 in bilateral deltoid, bicep, tricep, grip,  2- Bilat HF, 3- B KE, ADF/PF   Musculoskeletal:pain free active assisted  range of motion in all 4 extremities. No joint swelling   Assessment/Plan: 1. Functional deficits secondary to debility post urosepsis which require 3+ hours per day of interdisciplinary therapy in a comprehensive inpatient rehab setting.  Physiatrist is providing close team supervision and 24 hour management of active medical problems listed below.  Physiatrist  and rehab team continue to assess barriers to discharge/monitor patient progress toward functional and medical goals  Care Tool:  Bathing    Body parts bathed by patient: Right arm, Left arm, Chest, Abdomen, Right upper leg, Left upper leg, Face   Body parts bathed by helper: Front perineal area, Buttocks, Right lower leg, Left lower leg     Bathing assist Assist Level: Maximal Assistance - Patient 24 - 49%     Upper Body Dressing/Undressing Upper body dressing   What is the patient wearing?: Pull over shirt    Upper body assist Assist Level: Moderate Assistance - Patient 50 - 74%    Lower Body Dressing/Undressing Lower body dressing      What is the patient wearing?: Underwear/pull up, Pants     Lower body assist Assist for lower body dressing: Total Assistance - Patient < 25%     Toileting Toileting Toileting Activity did not occur Landscape architect and hygiene only): N/A (no void or bm)  Toileting assist Assist for toileting:  (Foley for urine, no BM at eval)     Transfers Chair/bed transfer  Transfers assist           Locomotion Ambulation   Ambulation assist  Walk 10 feet activity   Assist           Walk 50 feet activity   Assist           Walk 150 feet activity   Assist           Walk 10 feet on uneven surface  activity   Assist           Wheelchair     Assist               Wheelchair 50 feet with 2 turns activity    Assist            Wheelchair 150 feet activity     Assist          Blood pressure (!) 147/65, pulse 97, temperature 97.7 F (36.5 C), resp. rate 20, height _0  (1.727 m), weight 82.7 kg, SpO2 95 %.  Medical Problem List and Plan: 1.  Altered mental status with decreased functional mobility secondary to sepsis/encephalopathy patient completed 112 days of ceftriaxone as well as recent course of Rocephin             -patient may shower              -ELOS/Goals: minA 20-24 days Team conference today please see physician documentation under team conference tab, met with team  to discuss problems,progress, and goals. Formulized individual treatment plan based on medical history, underlying problem and comorbidities.  2.  Antithrombotics: -DVT/anticoagulation: Subcutaneous heparin             -antiplatelet therapy: Aspirin 81 mg daily 3. Pain Management: Tylenol as needed. Well controlled.  4. Mood: Lexapro 20 mg daily             -antipsychotic agents: N/A 5. Neuropsych: This patient is capable of making decisions on her own behalf. 6. Skin/Wound Care: Routine skin checks Cyanotic left toes #1,2,3, and R #1, plan for VVS is to monitor  Demarcation and f/u as OP  unchanged vs image from 10/13 7. Fluids/Electrolytes/Nutrition: Routine in and outs with follow-up chemistries 8.  Pulmonary sarcoidosis.  Patient on 2 L nasal cannula baseline.  Follow-up outpatient 9.  Hypertension.  Monitor with increased mobility. Well controlled.  10.  Asthma.  Continue inhalers as directed 11.  Hyperlipidemia.  Lipitor 12.  Hypothyroidism.  Synthroid 13.  AKI.  Follow-up chemistries 14.  Bilateral lower extremity ischemia.  No urgent indication for lower extremity angiogram at present.  Follow-up vascular surgery. 15.  Urinary retention.  DC foley and initiate voiding trial    LOS: 1 days A FACE TO FACE EVALUATION WAS PERFORMED  Stacie [name] 07/29/2020, 12:36 PM       Physical Medicine and Rehabilitation Admission H&P    No chief complaint on file. : HPI: Stacie [name] is a 64 year old right-handed female with history of arrhythmia, hypertension, asthma, hypothyroidism hard of hearing with hearing aids, pulmonary sarcoidosis on 2 L nasal cannula at baseline  maintained methotrexate as well as chronic prednisone, OSA, IBS, chronic kidney disease with creatinine 1.04-1.11.  Per chart review patient lives with spouse who is a Advertising account planner.  Two-level home bed and bath upstairs.  Independent with assistive device.  Presented 07/09/2020 with altered mental status and shortness of breath.  Cranial CT scan showed chronic white matter ischemic changes without acute abnormality.  CT of the chest showed chronic changes of pulmonary sarcoidosis with biapical predominant pulmonary fibrosis.  Very mild superimposed active  inflammatory infiltrate.  Ultrasound of the abdomen showed liver echogenicity overall increased in mildly inhomogeneous.  Appearance most likely indicate of of hepatic steatosis.  No focal liver lesions evident.  Admission chemistries BUN 37, creatinine 4.16, lactic acid 5.8, urinalysis negative nitrite, urine culture greater 100,000 Klebsiella, troponin 88, hemoglobin 12.2, WBC 32,900, blood cultures enterobacterales and Klebsiella.  Placed on broad-spectrum antibiotics presently on Rocephin for suspect sepsis secondary to community-acquired pneumonia and UTI through 07/28/2020 and stop.  Renal service is consulted for AKI placed on lactated Ringer's.  Renal ultrasound showed no hydronephrosis.  Small volume right upper quadrant ascites.  No indications for hemodialysis.  Infectious disease consulted 07/13/2020 for Klebsiella bacteremia and maintained on ceftriaxone x112 days that has since been completed.  CT of abdomen completed to look for renal/ureteric stone that showed 7 to 8 mm proximal right ureteral stone with obstructive change and perinephric stranding.  Minimal free fluid within the abdomen and pelvis.  Urology consulted underwent cystoscopy as well as bilateral retrograde pyelogram with right and left ureteral stent placement 07/14/2020.  Patient would need bilateral outpatient ureteroscopy, laser lithotripsy and stent exchange in 4 to 8 weeks.  Patient did remain intubated for a short time.  Neurology consulted 07/16/2020 for right side weakness and slurred speech.  MRI showed severely limited evaluation with  multiple sequences.  Diffusion-weighted imaging significantly limited with multiple small areas of DWI hyperintensity in the bilateral white matter representing artifact versus small infarcts.  CT angiogram of head and neck limited with no evidence of large vessel occlusion or significant arterial stenosis.  MRI later repeated 07/21/2020 unchanged DWI from prior imaging with small vague foci of trace diffusion abnormality suggesting scattered small foci of subacute white matter ischemia.  Underlying advanced but nonspecific chronic signal changes in the cerebral white matter in the pons redemonstrated most commonly due to small vessel disease.  EEG showed some background slowing with rare triphasic waves noted no seizure activity.  Echocardiogram with ejection fraction of 30 to 35% the left ventricle demonstrating global hypokinesis.  Echo bubble study no evidence of thrombus and grade 3 diastolic dysfunction.  TEE results pending.  Podiatry as well as vascular surgery consulted for necrosis dry gangrene of both feet left greater than right 07/20/2020 with ischemic changes.  Vascular surgery did not feel any CT angiogram or intervention was needed and advised to continue to follow.  HIT had been ruled out.  Currently maintained on subcutaneous heparin for DVT prophylaxis.  Review of Systems  Constitutional: Negative for chills and fever.  HENT: Positive for hearing loss.   Eyes: Negative for blurred vision and double vision.  Respiratory: Positive for shortness of breath.   Cardiovascular: Positive for palpitations. Negative for chest pain and leg swelling.  Gastrointestinal: Positive for constipation. Negative for heartburn, nausea and vomiting.       GERD  Genitourinary: Negative for dysuria, flank pain and hematuria.  Musculoskeletal: Positive for joint pain.  Skin: Negative for rash.  All other systems reviewed and are negative.  Past Medical History:  Diagnosis Date  . Arrhythmia    patient  unaware if this is current  . Asthma   . GERD (gastroesophageal reflux disease)   . Heart murmur   . History of kidney stones   . HOH (hard of hearing)    wear aids  . Hypothyroidism   . IBS (irritable bowel syndrome)   . Pulmonary hypertension ([place])   . Sarcoid   . Sarcoidosis   . Seasonal allergies   .  Sleep apnea CPAP with O2  . Wears hearing aid in both ears    Past Surgical History:  Procedure Laterality Date  . CARDIAC CATHETERIZATION  10/18/2018   [facility]  . CATARACT EXTRACTION W/PHACO Left 07/31/2019   Procedure: CATARACT EXTRACTION PHACO AND INTRAOCULAR LENS PLACEMENT (IOC) LEFT 00:51.1  17.9%  9.15;  Surgeon: Leandrew Koyanagi, MD;  Location: [facility];  Service: Ophthalmology;  Laterality: Left;  keep this patient second  . COLON SURGERY     "colon was fused to bladder - operated on both"  . COLONOSCOPY  09/18/2007   diverticuli, no polyps  . COLONOSCOPY  05/26/2010   diverticuli, no polyps  . CYSTOSCOPY WITH STENT PLACEMENT Bilateral 07/14/2020   Procedure: CYSTOSCOPY WITH STENT PLACEMENT, RETROPYLOGRAM;  Surgeon: Billey Co, MD;  Location: [facility];  Service: Urology;  Laterality: Bilateral;  . CYSTOSCOPY/URETEROSCOPY/HOLMIUM LASER/STENT PLACEMENT Left 02/20/2020   Procedure: CYSTOSCOPY/URETEROSCOPY/LITHOTRIPSY /STENT PLACEMENT;  Surgeon: Hollice Espy, MD;  Location: [facility];  Service: Urology;  Laterality: Left;  . PARS PLANA VITRECTOMY Right 05/20/2015   Procedure: PARS PLANA VITRECTOMY WITH 25 GAUGE, laser;  Surgeon: Milus Height, MD;  Location: [facility];  Service: Ophthalmology;  Laterality: Right;  . PARTIAL HYSTERECTOMY  1990  . TEE WITHOUT CARDIOVERSION N/A 07/27/2020   Procedure: TRANSESOPHAGEAL ECHOCARDIOGRAM (TEE);  Surgeon: Teodoro Spray, MD;  Location: [facility];  Service: Cardiovascular;  Laterality: N/A;  . TUBAL LIGATION     Family History  Problem Relation Age of Onset  . Allergies Father   . Asthma Father   . Colon cancer  Father   . Allergies Brother   . Asthma Brother   . Breast cancer Maternal Grandmother    Social History:  reports that she has never smoked. She has never used smokeless tobacco. She reports that she does not drink alcohol and does not use drugs. Allergies:  Allergies  Allergen Reactions  . Nitrofurantoin Nausea Only  . Tramadol     Nausea and vomiting  . Sulfa Antibiotics Rash    As an infant   Medications Prior to Admission  Medication Sig Dispense Refill  . aspirin EC 81 MG EC tablet Take 1 tablet (81 mg total) by mouth daily. Swallow whole. 30 tablet 11  . atorvastatin (LIPITOR) 40 MG tablet Take 1 tablet (40 mg total) by mouth daily. 30 tablet 0  . bisacodyl (DULCOLAX) 10 MG suppository Place 1 suppository (10 mg total) rectally daily as needed for moderate constipation. 12 suppository 0  . budesonide-formoterol (SYMBICORT) 80-4.5 MCG/ACT inhaler Take 2 puffs first thing in am and then another 2 puffs about 12 hours later. (Patient taking differently: Inhale 2 puffs into the lungs in the morning and at bedtime. Take 2 puffs first thing in am and then another 2 puffs about 12 hours later.) 1 Inhaler 11  . denosumab (PROLIA) 60 MG/ML SOSY injection Inject 60 mg into the skin every 6 (six) months.    . docusate (COLACE) 50 MG/5ML liquid Take 10 mLs (100 mg total) by mouth 2 (two) times daily. 100 mL 0  . dorzolamide (TRUSOPT) 2 % ophthalmic solution Place 1 drop into both eyes 2 (two) times daily.    Marland Kitchen escitalopram (LEXAPRO) 20 MG tablet     . feeding supplement, ENSURE ENLIVE, (ENSURE ENLIVE) LIQD Take 237 mLs by mouth 3 (three) times daily between meals. 250 mL 12  . folic acid (FOLVITE) 1 MG tablet Take 1 mg by mouth daily.  11  . heparin 5000  UNIT/ML injection Inject 1 mL (5,000 Units total) into the skin every 8 (eight) hours. 1 mL 0  . latanoprost (XALATAN) 0.005 % ophthalmic solution Place 1 drop into the right eye at bedtime.    . methotrexate (RHEUMATREX) 5 MG tablet Take 5  tablets (25 mg total) by mouth once a week. Caution:Chemotherapy. Protect from light. 4 tablet 0  . Multiple Vitamins-Minerals (MULTIVITAMIN WITH MINERALS) tablet Take 1 tablet by mouth daily.    . NON FORMULARY CPAP nightly    . nystatin (MYCOSTATIN/NYSTOP) powder Apply 1 application topically 3 (three) times daily. 15 g 0  . OXYGEN Inhale 2 L into the lungs as needed.    . pantoprazole (PROTONIX) 40 MG tablet Take 1 tablet (40 mg total) by mouth daily. 30 tablet 0  . polyethylene glycol (MIRALAX / GLYCOLAX) 17 g packet Take 17 g by mouth daily as needed for moderate constipation. 14 each 0  . [START ON 07/30/2020] predniSONE (DELTASONE) 5 MG tablet Take 3 tablets (15 mg total) by mouth daily with breakfast. 60 tablet 0  . SYNTHROID 137 MCG tablet TAKE 1 TABLET (137 MCG TOTAL) BY MOUTH DAILY BEFORE BREAKFAST. 90 tablet 0  . tamsulosin (FLOMAX) 0.4 MG CAPS capsule Take 1 capsule (0.4 mg total) by mouth daily. 30 capsule 0  . Treprostinil (TYVASO) 0.6 MG/ML SOLN Inhale 18 mcg into the lungs 4 (four) times daily. 12 breaths 4 times a day      Drug Regimen Review Drug regimen was reviewed and remains appropriate with no significant issues identified  Home: Home Living Living Arrangements: Spouse/significant other Available Help at Discharge: Family Type of Home: House Home Access: Stairs to enter Technical brewer of Steps: 2 Home Layout: Two level, Able to live on main level with bedroom/bathroom (able to convert dining room into bedroom if needed, 1/2 bath on main) Alternate Level Stairs-Number of Steps: 15 Bathroom Shower/Tub: Tub/shower unit, Walk-in shower (both upstairs) Technical brewer Accessibility: Yes  Lives With: Spouse   Functional History: Prior Function Comments: Mod I with ADLs, household and limited community mobilization; home O2 at 2L.  Functional Status:  Mobility:          ADL:    Cognition: Cognition Overall Cognitive Status:  Within Functional Limits for tasks assessed Arousal/Alertness: Awake/alert Orientation Level: Oriented X4 Sustained Attention: Impaired (limited by fatigue) Immediate Memory Recall: Sock, Blue, Bed Memory Recall Sock: Without Cue Memory Recall Blue: Without Cue Memory Recall Bed: Without Cue Awareness: Impaired Safety/Judgment: Appears intact Cognition Overall Cognitive Status: Within Functional Limits for tasks assessed  Physical Exam: Blood pressure (!) 147/65, pulse 97, temperature 97.7 F (36.5 C), resp. rate 20, height _0  (1.727 m), weight 82.7 kg, SpO2 95 %.   General: Alert and oriented x 3, No apparent distress HEENT: Head is normocephalic, atraumatic, PERRLA, EOMI, sclera anicteric, oral mucosa pink and moist, dentition intact, ext ear canals clear,  Neck: Supple without JVD or lymphadenopathy Heart: Reg rate and rhythm. No murmurs rubs or gallops Chest: CTA bilaterally without wheezes, rales, or rhonchi; no distress Abdomen: Soft, non-tender, non-distended, bowel sounds positive. Extremities: Medial toes bilaterally are necrotic Skin: Clean and intact without signs of breakdown Neuro: Patient is alert a bit restless.  Mild right facial droop. Needs verbal cues for direction and easily distracted. Left side 5/5, right side 4+/5 throughout.  Psych: Pt's affect is appropriate. Pt is cooperative GU: foley in place   Results for orders placed or performed during the hospital encounter of  07/28/20 (from the past 48 hour(s))  CBC     Status: Abnormal   Collection Time: 07/28/20  3:36 PM  Result Value Ref Range   WBC 14.0 (H) 4.0 - 10.5 K/uL   RBC 3.50 (L) 3.87 - 5.11 MIL/uL   Hemoglobin 10.9 (L) 12.0 - 15.0 g/dL   HCT 35.4 (L) 36 - 46 %   MCV 101.1 (H) 80.0 - 100.0 fL   MCH 31.1 26.0 - 34.0 pg   MCHC 30.8 30.0 - 36.0 g/dL   RDW 15.2 11.5 - 15.5 %   Platelets 170 150 - 400 K/uL   nRBC 0.0 0.0 - 0.2 %    Comment: Performed at [facility], [place] [address],  [place], [place] [address]  Creatinine, serum     Status: Abnormal   Collection Time: 07/28/20  3:36 PM  Result Value Ref Range   Creatinine, Ser 1.14 (H) 0.44 - 1.00 mg/dL   GFR, Estimated 50 (L) >60 mL/min    Comment: Performed at [facility] [address] [place], [place] [address]  Glucose, capillary     Status: Abnormal   Collection Time: 07/28/20  3:36 PM  Result Value Ref Range   Glucose-Capillary 125 (H) 70 - 99 mg/dL    Comment: Glucose reference range applies only to samples taken after fasting for at least 8 hours.  Glucose, capillary     Status: Abnormal   Collection Time: 07/28/20  5:20 PM  Result Value Ref Range   Glucose-Capillary 156 (H) 70 - 99 mg/dL    Comment: Glucose reference range applies only to samples taken after fasting for at least 8 hours.  Glucose, capillary     Status: Abnormal   Collection Time: 07/28/20  9:02 PM  Result Value Ref Range   Glucose-Capillary 192 (H) 70 - 99 mg/dL    Comment: Glucose reference range applies only to samples taken after fasting for at least 8 hours.  Glucose, capillary     Status: None   Collection Time: 07/29/20  6:18 AM  Result Value Ref Range   Glucose-Capillary 94 70 - 99 mg/dL    Comment: Glucose reference range applies only to samples taken after fasting for at least 8 hours.  CBC WITH DIFFERENTIAL     Status: Abnormal   Collection Time: 07/29/20  7:09 AM  Result Value Ref Range   WBC 13.3 (H) 4.0 - 10.5 K/uL   RBC 3.57 (L) 3.87 - 5.11 MIL/uL   Hemoglobin 10.9 (L) 12.0 - 15.0 g/dL   HCT 35.0 (L) 36 - 46 %   MCV 98.0 80.0 - 100.0 fL   MCH 30.5 26.0 - 34.0 pg   MCHC 31.1 30.0 - 36.0 g/dL   RDW 14.9 11.5 - 15.5 %   Platelets 167 150 - 400 K/uL   nRBC 0.0 0.0 - 0.2 %   Neutrophils Relative % 85 %   Neutro Abs 11.3 (H) 1.7 - 7.7 K/uL   Lymphocytes Relative 8 %   Lymphs Abs 1.0 0.7 - 4.0 K/uL   Monocytes Relative 4 %   Monocytes Absolute 0.6 0.1 - 1.0 K/uL   Eosinophils Relative 2 %   Eosinophils Absolute  0.2 0.0 - 0.5 K/uL   Basophils Relative 0 %   Basophils Absolute 0.0 0.0 - 0.1 K/uL   Immature Granulocytes 1 %   Abs Immature Granulocytes 0.16 (H) 0.00 - 0.07 K/uL    Comment: Performed at [facility], [address] [place], [place] [address]  Comprehensive metabolic panel     Status: Abnormal   Collection Time: 07/29/20  7:09 AM  Result Value Ref Range   Sodium 141 135 - 145 mmol/L   Potassium 3.6 3.5 - 5.1 mmol/L   Chloride 102 98 - 111 mmol/L   CO2 30 22 - 32 mmol/L   Glucose, Bld 93 70 - 99 mg/dL    Comment: Glucose reference range applies only to samples taken after fasting for at least 8 hours.   BUN 28 (H) 8 - 23 mg/dL   Creatinine, Ser 1.02 (H) 0.44 - 1.00 mg/dL   Calcium 8.7 (L) 8.9 - 10.3 mg/dL   Total Protein 5.5 (L) 6.5 - 8.1 g/dL   Albumin 2.4 (L) 3.5 - 5.0 g/dL   AST 44 (H) 15 - 41 U/L   ALT 59 (H) 0 - 44 U/L   Alkaline Phosphatase 73 38 - 126 U/L   Total Bilirubin 0.6 0.3 - 1.2 mg/dL   GFR, Estimated 58 (L) >60 mL/min   Anion gap 9 5 - 15    Comment: Performed at [facility] [address], [place], [place] [address]  Glucose, capillary     Status: Abnormal   Collection Time: 07/29/20 11:40 AM  Result Value Ref Range   Glucose-Capillary 181 (H) 70 - 99 mg/dL    Comment: Glucose reference range applies only to samples taken after fasting for at least 8 hours.   No results found.     Medical Problem List and Plan: 1.  Altered mental status with decreased functional mobility secondary to sepsis/encephalopathy patient completed 112 days of ceftriaxone as well as recent course of Rocephin  -patient may shower  -ELOS/Goals: minA 20-24 days 2.  Antithrombotics: -DVT/anticoagulation: Subcutaneous heparin  -antiplatelet therapy: Aspirin 81 mg daily 3. Pain Management: Tylenol as needed. Well controlled.  4. Mood: Lexapro 20 mg daily  -antipsychotic agents: N/A 5. Neuropsych: This patient is capable of making decisions on her own behalf. 6.  Skin/Wound Care: Routine skin checks 7. Fluids/Electrolytes/Nutrition: Routine in and outs with follow-up chemistries 8.  Pulmonary sarcoidosis.  Patient on 2 L nasal cannula baseline.  Follow-up outpatient 9.  Hypertension.  Monitor with increased mobility. Well controlled.  10.  Asthma.  Continue inhalers as directed 11.  Hyperlipidemia.  Lipitor 12.  Hypothyroidism.  Synthroid 13.  AKI.  Follow-up chemistries 14.  Bilateral lower extremity ischemia.  No urgent indication for lower extremity angiogram at present.  Follow-up vascular surgery. 15.  Urinary retention.  DC foley and initiate voiding trial  Francely Parsons, PA-C  I have personally performed a face to face diagnostic evaluation, including, but not limited to relevant history and physical exam findings, of this patient and developed relevant assessment and plan.  Additionally, I have reviewed and concur with the physician assistant's documentation above.  Leeroy Cha, MD

## 2020-07-29 NOTE — Evaluation (Signed)
Physical Therapy Assessment and Plan  Patient Details  Name: Stacie [name] MRN: [id number] Date of Birth: April 19, 1956  PT Diagnosis: Abnormal posture, Abnormality of gait, Cognitive deficits, Coordination disorder, Hemiplegia non-dominant and Muscle weakness Rehab Potential: Fair ELOS: 3-4 weeks   Today's Date: 07/29/2020 PT Individual Time: 1300-1400 PT Individual Time Calculation (min): 60 min    Hospital Problem: Active Problems:   Sepsis [facility])   Past Medical History:  Past Medical History:  Diagnosis Date  . Arrhythmia    patient unaware if this is current  . Asthma   . GERD (gastroesophageal reflux disease)   . Heart murmur   . History of kidney stones   . HOH (hard of hearing)    wear aids  . Hypothyroidism   . IBS (irritable bowel syndrome)   . Pulmonary hypertension ([place])   . Sarcoid   . Sarcoidosis   . Seasonal allergies   . Sleep apnea CPAP with O2  . Wears hearing aid in both ears    Past Surgical History:  Past Surgical History:  Procedure Laterality Date  . CARDIAC CATHETERIZATION  10/18/2018   [facility]  . CATARACT EXTRACTION W/PHACO Left 07/31/2019   Procedure: CATARACT EXTRACTION PHACO AND INTRAOCULAR LENS PLACEMENT (IOC) LEFT 00:51.1  17.9%  9.15;  Surgeon: Leandrew Koyanagi, MD;  Location: [facility];  Service: Ophthalmology;  Laterality: Left;  keep this patient second  . COLON SURGERY     "colon was fused to bladder - operated on both"  . COLONOSCOPY  09/18/2007   diverticuli, no polyps  . COLONOSCOPY  05/26/2010   diverticuli, no polyps  . CYSTOSCOPY WITH STENT PLACEMENT Bilateral 07/14/2020   Procedure: CYSTOSCOPY WITH STENT PLACEMENT, RETROPYLOGRAM;  Surgeon: Billey Co, MD;  Location: [facility];  Service: Urology;  Laterality: Bilateral;  . CYSTOSCOPY/URETEROSCOPY/HOLMIUM LASER/STENT PLACEMENT Left 02/20/2020   Procedure: CYSTOSCOPY/URETEROSCOPY/LITHOTRIPSY /STENT PLACEMENT;  Surgeon: Hollice Espy, MD;  Location: [facility];   Service: Urology;  Laterality: Left;  . PARS PLANA VITRECTOMY Right 05/20/2015   Procedure: PARS PLANA VITRECTOMY WITH 25 GAUGE, laser;  Surgeon: Milus Height, MD;  Location: [facility];  Service: Ophthalmology;  Laterality: Right;  . PARTIAL HYSTERECTOMY  1990  . TEE WITHOUT CARDIOVERSION N/A 07/27/2020   Procedure: TRANSESOPHAGEAL ECHOCARDIOGRAM (TEE);  Surgeon: Teodoro Spray, MD;  Location: [facility];  Service: Cardiovascular;  Laterality: N/A;  . TUBAL LIGATION      Assessment & Plan Clinical Impression: Patient is a 64 year old right-handed female with history of arrhythmia, hypertension, asthma, hypothyroidism hard of hearing with hearing aids, pulmonary sarcoidosis on 2 L nasal cannula at baseline  maintained methotrexate as well as chronic prednisone, OSA, IBS, chronic kidney disease with creatinine 1.04-1.11.  Per chart review patient lives with spouse who is a Surveyor, quantity.  Two-level home bed and bath upstairs.  Independent with assistive device.  Presented 07/09/2020 with altered mental status and shortness of breath.  Cranial CT scan showed chronic white matter ischemic changes without acute abnormality.  CT of the chest showed chronic changes of pulmonary sarcoidosis with biapical predominant pulmonary fibrosis.  Very mild superimposed active inflammatory infiltrate.  Ultrasound of the abdomen showed liver echogenicity overall increased in mildly inhomogeneous.  Appearance most likely indicate of of hepatic steatosis.  No focal liver lesions evident.  Admission chemistries BUN 37, creatinine 4.16, lactic acid 5.8, urinalysis negative nitrite, urine culture greater 100,000 Klebsiella, troponin 88, hemoglobin 12.2, WBC 32,900, blood cultures enterobacterales and Klebsiella.  Placed on broad-spectrum antibiotics presently on Rocephin  for suspect sepsis secondary to community-acquired pneumonia and UTI through 07/28/2020 and stop.  Renal service is consulted for AKI placed on lactated  Ringer's.  Renal ultrasound showed no hydronephrosis.  Small volume right upper quadrant ascites.  No indications for hemodialysis.  Infectious disease consulted 07/13/2020 for Klebsiella bacteremia and maintained on ceftriaxone x112 days that has since been completed.  CT of abdomen completed to look for renal/ureteric stone that showed 7 to 8 mm proximal right ureteral stone with obstructive change and perinephric stranding.  Minimal free fluid within the abdomen and pelvis.  Urology consulted underwent cystoscopy as well as bilateral retrograde pyelogram with right and left ureteral stent placement 07/14/2020.  Patient would need bilateral outpatient ureteroscopy, laser lithotripsy and stent exchange in 4 to 8 weeks.  Patient did remain intubated for a short time.  Neurology consulted 07/16/2020 for right side weakness and slurred speech.  MRI showed severely limited evaluation with multiple sequences.  Diffusion-weighted imaging significantly limited with multiple small areas of DWI hyperintensity in the bilateral white matter representing artifact versus small infarcts.  CT angiogram of head and neck limited with no evidence of large vessel occlusion or significant arterial stenosis.  MRI later repeated 07/21/2020 unchanged DWI from prior imaging with small vague foci of trace diffusion abnormality suggesting scattered small foci of subacute white matter ischemia.  Underlying advanced but nonspecific chronic signal changes in the cerebral white matter in the pons redemonstrated most commonly due to small vessel disease.  EEG showed some background slowing with rare triphasic waves noted no seizure activity.  Echocardiogram with ejection fraction of 30 to 35% the left ventricle demonstrating global hypokinesis.  Echo bubble study no evidence of thrombus and grade 3 diastolic dysfunction.  TEE results pending.  Podiatry as well as vascular surgery consulted for necrosis dry gangrene of both feet left greater than  right 07/20/2020 with ischemic changes.  Vascular surgery did not feel any CT angiogram or intervention was needed and advised to continue to follow.  Patient transferred to CIR on 07/28/2020 .   Patient currently requires max with mobility secondary to muscle weakness and muscle joint tightness, decreased cardiorespiratoy endurance, abnormal tone, unbalanced muscle activation and decreased coordination, decreased attention, decreased awareness, decreased problem solving, decreased safety awareness, decreased memory and delayed processing and decreased sitting balance, decreased standing balance, decreased postural control, hemiplegia and decreased balance strategies.  Prior to hospitalization, patient was independent  with mobility and lived with Spouse in a House home.  Home access is 2Stairs to enter.  Patient will benefit from skilled PT intervention to maximize safe functional mobility, minimize fall risk and decrease caregiver burden for planned discharge home with 24 hour assist.  Anticipate patient will benefit from follow up [facility] at discharge.  PT - End of Session Activity Tolerance: Tolerates < 10 min activity with changes in vital signs Endurance Deficit: Yes Endurance Deficit Description: fatigues very quickly during ADL PT Assessment Rehab Potential (ACUTE/IP ONLY): Fair PT Barriers to Discharge: [place] home environment;Decreased caregiver support;Insurance for SNF coverage PT Patient demonstrates impairments in the following area(s): Balance;Edema;Behavior;Endurance;Motor;Perception;Safety;Sensory;Skin Integrity;Nutrition PT Transfers Functional Problem(s): Bed Mobility;Bed to Chair;Car;Furniture;Floor PT Locomotion Functional Problem(s): Ambulation;Wheelchair Mobility;Stairs PT Plan PT Intensity: Minimum of 1-2 x/day ,45 to 90 minutes PT Frequency: 5 out of 7 days PT Duration Estimated Length of Stay: 3-4 weeks PT Treatment/Interventions: Ambulation/gait training;Cognitive  remediation/compensation;Discharge planning;DME/adaptive equipment instruction;Functional mobility training;Splinting/orthotics;Psychosocial support;Pain management;Therapeutic Activities;UE/LE Strength taining/ROM;Visual/perceptual remediation/compensation;Wheelchair propulsion/positioning;UE/LE Coordination activities;Therapeutic Exercise;Stair training;Skin care/wound management;Patient/family education;Neuromuscular re-education;Functional electrical stimulation;Disease management/prevention;Community  reintegration;Balance/vestibular training PT Transfers Anticipated Outcome(s): Min assist with LRAd PT Locomotion Anticipated Outcome(s): Supervision assist WC mobility. min a gait for short distances PT Recommendation Follow Up Recommendations: Home health PT Patient destination: Home Equipment Recommended: To be determined;Rolling walker with 5" wheels   PT Evaluation Precautions/Restrictions Precautions Precautions: Fall Precaution Comments: monitor O2, 2L continuous Restrictions Weight Bearing Restrictions: No General PT Amount of Missed Time (min): 15 Minutes Vital SignsTherapy Vitals Pulse Rate: 97 Resp: 20 Patient Position (if appropriate): Lying Oxygen Therapy SpO2: 95 % O2 Device: Nasal Cannula O2 Flow Rate (L/min): 3 L/min FiO2 (%): 32 % Pain Pain Assessment Pain Scale: 0-10 Pain Score: 4  Pain Type: Chronic pain Pain Location: Leg Pain Orientation: Right;Left Pain Descriptors / Indicators: Aching Pain Frequency: Intermittent Pain Onset: Gradual Pain Intervention(s): Medication (See eMAR) Home Living/Prior Functioning Home Living Living Arrangements: Spouse/significant other Available Help at Discharge: Family Type of Home: House Home Access: Stairs to enter Technical brewer of Steps: 2 Entrance Stairs-Rails: Right;Left Home Layout: Two level;Able to live on main level with bedroom/bathroom (able to convert dining room into bedroom if needed, 1/2 bath on  main) Alternate Level Stairs-Number of Steps: 15 Bathroom Shower/Tub: Tub/shower unit;Walk-in shower (both upstairs) Bathroom Toilet: Associate Professor Accessibility: Yes Additional Comments: Patient limited historian; will verify with family as available  Lives With: Spouse Prior Function Level of Independence: Independent with basic ADLs;Independent with transfers  Able to Take Stairs?: Yes Comments: Mod I with ADLs, household and limited community mobilization; home O2 at 2L. Vision/Perception  Perception Perception: Impaired Praxis Praxis: Impaired Praxis Impairment Details: Initiation  Cognition Overall Cognitive Status: Within Functional Limits for tasks assessed Arousal/Alertness: Awake/alert Sustained Attention: Impaired (limited by fatigue) Immediate Memory Recall: Sock;Blue;Bed Memory Recall Sock: Without Cue Memory Recall Blue: Without Cue Memory Recall Bed: Without Cue Awareness: Impaired Safety/Judgment: Appears intact Sensation Sensation Light Touch: Impaired by gross assessment Coordination Gross Motor Movements are Fluid and Coordinated: No Fine Motor Movements are Fluid and Coordinated: No Coordination and Movement Description: generalized weakness and deconditioning limiting, some R side weakness with LE>UE Motor  Motor Motor: Hemiplegia;Abnormal postural alignment and control Motor - Skilled Clinical Observations: very deconditioned, weakness limiting, decreased RUE coordination   Trunk/Postural Assessment  Cervical Assessment Cervical Assessment: Exceptions to [facility] (FWD head) Thoracic Assessment Thoracic Assessment: Exceptions to [facility] (kyphotic posture/rounded shoulders) Lumbar Assessment Lumbar Assessment: Exceptions to [facility] (posterior pelvic tilt at EOB) Postural Control Postural Control: Deficits on evaluation  Balance Balance Balance Assessed: Yes Static Sitting Balance Static Sitting - Balance Support: Bilateral upper extremity  supported Static Sitting - Level of Assistance: 5: Stand by assistance Dynamic Sitting Balance Dynamic Sitting - Balance Support: Left upper extremity supported;During functional activity Dynamic Sitting - Level of Assistance: 4: Min assist Sitting balance - Comments: SBA static sitting, increased assist with functional tasks d/t fatigue and decreased core strength Extremity Assessment  RUE Assessment RUE Assessment: Exceptions to [facility] Active Range of Motion (AROM) Comments: WFL General Strength Comments: 3+ to 4-/5 roughly, ROM WFL but decreased strength and coordination LUE Assessment LUE Assessment: Within Functional Limits General Strength Comments: generalized weakness but functional RLE Assessment RLE Assessment: Exceptions to [facility] General Strength Comments: grossly 3+/5 proximal to distal LLE Assessment LLE Assessment: Exceptions to [facility] General Strength Comments: grossly 3-/5 proximal to distal with testing, able to stand in stedy with min assist and no knee instability noted for 10 seconds  Care Tool Care Tool Bed Mobility Roll left and right activity   Roll left and  right assist level: Moderate Assistance - Patient 50 - 74%    Sit to lying activity   Sit to lying assist level: Maximal Assistance - Patient 25 - 49%    Lying to sitting edge of bed activity   Lying to sitting edge of bed assist level: Maximal Assistance - Patient 25 - 49%     Care Tool Transfers Sit to stand transfer   Sit to stand assist level: 2 Helpers    Chair/bed transfer   Chair/bed transfer assist level: Dependent - mechanical lift     Toilet transfer Toilet transfer activity did not occur: Safety/medical concerns      Scientist, product/process development transfer activity did not occur: Safety/medical concerns        Care Tool Locomotion Ambulation Ambulation activity did not occur: Safety/medical concerns        Walk 10 feet activity Walk 10 feet activity did not occur: Safety/medical concerns        Walk 50 feet with 2 turns activity Walk 50 feet with 2 turns activity did not occur: Safety/medical concerns      Walk 150 feet activity Walk 150 feet activity did not occur: Safety/medical concerns      Walk 10 feet on uneven surfaces activity Walk 10 feet on uneven surfaces activity did not occur: Safety/medical concerns      Stairs Stair activity did not occur: Safety/medical concerns        Walk up/down 1 step activity Walk up/down 1 step or curb (drop down) activity did not occur: Safety/medical concerns     Walk up/down 4 steps activity did not occuR: Safety/medical concerns  Walk up/down 4 steps activity      Walk up/down 12 steps activity        Pick up small objects from floor Pick up small object from the floor (from standing position) activity did not occur: Safety/medical concerns      Wheelchair     Wheelchair activity did not occur: Safety/medical concerns      Wheel 50 feet with 2 turns activity Wheelchair 50 feet with 2 turns activity did not occur: Safety/medical concerns    Wheel 150 feet activity Wheelchair 150 feet activity did not occur: Safety/medical concerns      Refer to Care Plan for [place] [facility]    Recommendations for other services: None   Skilled Therapeutic Intervention  Pt received supine in bed and agreeable to PT. Supine>sit transfer with max assist as listed. PT instructed patient in PT Evaluation and initiated treatment intervention; see above for results. PT educated patient in [place], rehab potential, rehab goals, and discharge recommendations. Per PA, pt cleared to WB through BLE.   Throughout session pt maintained on 2.5L/min supplemental O2. SpO2 93-95% throughout, but respiratory rate 28-24 Breaths per min throughout session with and without activity. Sitting tolerance/balance EOB x 48mnutes with lateral reaching as well as reciprocal scooting. Supervision assist only with scooting to EOB. Sit<>stand  in stedy at EOB with mod assist and +2 present for safety. Once in standing. pts HR increased to 128, and reports severe lightheadedness. Returned to sitting EOB. PT unable to assess orthotstatics, but VS prior to stand 130/60, HR 108. Following stand BP 113/69, HR 128. Once pt s/s of orhtostasis dissipated. Use of stedy [facility] with min-mod assist. Sit>supine completed with max A for LE management and left supine in bed with call bell in reach and all needs  met.     Mobility Bed Mobility Bed Mobility: Rolling Right;Rolling Left;Sitting - Scoot to Edge of Bed Rolling Right: Moderate Assistance - Patient 50-74% Rolling Left: Moderate Assistance - Patient 50-74% Supine to Sit: Maximal Assistance - Patient - Patient 25-49% Locomotion  Gait Ambulation: No Gait Gait: No Stairs / Additional Locomotion Stairs: No Wheelchair Mobility Wheelchair Mobility: No   Discharge Criteria: Patient will be discharged from PT if patient refuses treatment 3 consecutive times without medical reason, if treatment goals not met, if there is a change in medical status, if patient makes no progress towards goals or if patient is discharged from hospital.  The above assessment, treatment plan, treatment alternatives and goals were discussed and mutually agreed upon: by patient  Lorie Phenix 07/29/2020, 2:54 PM

## 2020-07-29 NOTE — Plan of Care (Signed)
  Problem: RH Balance Goal: LTG: Patient will maintain dynamic sitting balance (OT) Description: LTG:  Patient will maintain dynamic sitting balance with assistance during activities of daily living (OT) Flowsheets (Taken 07/29/2020 1733) LTG: Pt will maintain dynamic sitting balance during ADLs with: Supervision/Verbal cueing Goal: LTG Patient will maintain dynamic standing with ADLs (OT) Description: LTG:  Patient will maintain dynamic standing balance with assist during activities of daily living (OT)  Flowsheets (Taken 07/29/2020 1733) LTG: Pt will maintain dynamic standing balance during ADLs with: Supervision/Verbal cueing   Problem: Sit to Stand Goal: LTG:  Patient will perform sit to stand in prep for activites of daily living with assistance level (OT) Description: LTG:  Patient will perform sit to stand in prep for activites of daily living with assistance level (OT) Flowsheets (Taken 07/29/2020 1733) LTG: PT will perform sit to stand in prep for activites of daily living with assistance level: Supervision/Verbal cueing   Problem: RH Grooming Goal: LTG Patient will perform grooming w/assist,cues/equip (OT) Description: LTG: Patient will perform grooming with assist, with/without cues using equipment (OT) Flowsheets (Taken 07/29/2020 1733) LTG: Pt will perform grooming with assistance level of: Set up assist    Problem: RH Bathing Goal: LTG Patient will bathe all body parts with assist levels (OT) Description: LTG: Patient will bathe all body parts with assist levels (OT) Flowsheets (Taken 07/29/2020 1733) LTG: Pt will perform bathing with assistance level/cueing: Minimal Assistance - Patient > 75%   Problem: RH Dressing Goal: LTG Patient will perform upper body dressing (OT) Description: LTG Patient will perform upper body dressing with assist, with/without cues (OT). Flowsheets (Taken 07/29/2020 1733) LTG: Pt will perform upper body dressing with assistance level of:  Supervision/Verbal cueing Goal: LTG Patient will perform lower body dressing w/assist (OT) Description: LTG: Patient will perform lower body dressing with assist, with/without cues in positioning using equipment (OT) Flowsheets (Taken 07/29/2020 1733) LTG: Pt will perform lower body dressing with assistance level of: Minimal Assistance - Patient > 75%   Problem: RH Toileting Goal: LTG Patient will perform toileting task (3/3 steps) with assistance level (OT) Description: LTG: Patient will perform toileting task (3/3 steps) with assistance level (OT)  Flowsheets (Taken 07/29/2020 1733) LTG: Pt will perform toileting task (3/3 steps) with assistance level: Minimal Assistance - Patient > 75%   Problem: RH Toilet Transfers Goal: LTG Patient will perform toilet transfers w/assist (OT) Description: LTG: Patient will perform toilet transfers with assist, with/without cues using equipment (OT) Flowsheets (Taken 07/29/2020 1733) LTG: Pt will perform toilet transfers with assistance level of: Supervision/Verbal cueing   Problem: RH Tub/Shower Transfers Goal: LTG Patient will perform tub/shower transfers w/assist (OT) Description: LTG: Patient will perform tub/shower transfers with assist, with/without cues using equipment (OT) Flowsheets (Taken 07/29/2020 1733) LTG: Pt will perform tub/shower stall transfers with assistance level of: Supervision/Verbal cueing

## 2020-07-29 NOTE — Telephone Encounter (Signed)
Called and left a VM on patients daughters phone. Informed her that her mother is currently on the schedule Friday for a hospital follow up. Explained that Dr. Army Melia said the patient should stay in rehab to get better, and then we will follow up after she is discharged from the rehab facility. Asked her to call the clinic back and let us know if it was okay to cancel her Friday appt.  CRM sent to call center for when patient calls back with reply.   CM

## 2020-07-29 NOTE — Evaluation (Signed)
Speech Language Pathology Assessment and Plan  Patient Details  Name: Stacie [name] MRN: [id number] Date of Birth: Dec 21, 1955  SLP Diagnosis: Cognitive Impairments  Rehab Potential: Good ELOS: 2 weels    Today's Date: 07/29/2020 SLP Individual Time: 1100-1200 SLP Individual Time Calculation (min): 60 min   Hospital Problem: Active Problems:   Sepsis [facility])  Past Medical History:  Past Medical History:  Diagnosis Date  . Arrhythmia    patient unaware if this is current  . Asthma   . GERD (gastroesophageal reflux disease)   . Heart murmur   . History of kidney stones   . HOH (hard of hearing)    wear aids  . Hypothyroidism   . IBS (irritable bowel syndrome)   . Pulmonary hypertension ([place])   . Sarcoid   . Sarcoidosis   . Seasonal allergies   . Sleep apnea CPAP with O2  . Wears hearing aid in both ears    Past Surgical History:  Past Surgical History:  Procedure Laterality Date  . CARDIAC CATHETERIZATION  10/18/2018   [facility]  . CATARACT EXTRACTION W/PHACO Left 07/31/2019   Procedure: CATARACT EXTRACTION PHACO AND INTRAOCULAR LENS PLACEMENT (IOC) LEFT 00:51.1  17.9%  9.15;  Surgeon: Leandrew Koyanagi, MD;  Location: [facility];  Service: Ophthalmology;  Laterality: Left;  keep this patient second  . COLON SURGERY     "colon was fused to bladder - operated on both"  . COLONOSCOPY  09/18/2007   diverticuli, no polyps  . COLONOSCOPY  05/26/2010   diverticuli, no polyps  . CYSTOSCOPY WITH STENT PLACEMENT Bilateral 07/14/2020   Procedure: CYSTOSCOPY WITH STENT PLACEMENT, RETROPYLOGRAM;  Surgeon: Billey Co, MD;  Location: [facility];  Service: Urology;  Laterality: Bilateral;  . CYSTOSCOPY/URETEROSCOPY/HOLMIUM LASER/STENT PLACEMENT Left 02/20/2020   Procedure: CYSTOSCOPY/URETEROSCOPY/LITHOTRIPSY /STENT PLACEMENT;  Surgeon: Hollice Espy, MD;  Location: [facility];  Service: Urology;  Laterality: Left;  . PARS PLANA VITRECTOMY Right 05/20/2015   Procedure:  PARS PLANA VITRECTOMY WITH 25 GAUGE, laser;  Surgeon: Milus Height, MD;  Location: [facility];  Service: Ophthalmology;  Laterality: Right;  . PARTIAL HYSTERECTOMY  1990  . TEE WITHOUT CARDIOVERSION N/A 07/27/2020   Procedure: TRANSESOPHAGEAL ECHOCARDIOGRAM (TEE);  Surgeon: Teodoro Spray, MD;  Location: [facility];  Service: Cardiovascular;  Laterality: N/A;  . TUBAL LIGATION      Assessment / Plan / Recommendation  Patient is a 64 year old right-handed female with history of arrhythmia, hypertension, asthma, hypothyroidism hard of hearing with hearing aids, pulmonary sarcoidosis on 2 L nasal cannula at baseline maintained methotrexate as well as chronic prednisone, OSA, IBS, chronic kidney disease with creatinine 1.04-1.11. Per chart review patient lives with spousewho is a Surveyor, quantity. Two-level home bed and bath upstairs. Independent with assistive device. Presented 07/09/2020 with altered mental status and shortness of breath. Cranial CT scan showed chronic white matter ischemic changes without acute abnormality. CT of the chest showed chronic changes of pulmonary sarcoidosis with biapical predominant pulmonary fibrosis. Very mild superimposed active inflammatory infiltrate. Ultrasound of the abdomen showed liver echogenicity overall increased in mildlyinhomogeneous. Appearance most likely indicate of of hepatic steatosis. No focal liver lesions evident. Admission chemistries BUN 37, creatinine 4.16, lactic acid 5.8, urinalysis negative nitrite, urine culture greater 100,000 Klebsiella, troponin 88, hemoglobin 12.2, WBC 32,900, blood cultures enterobacteralesand Klebsiella. Placed on broad-spectrum antibioticspresently on Rocephin forsuspect sepsis secondary to community-acquired pneumonia and UTIthrough 07/28/2020 and stop. Renal service is consulted for AKI placed on lactated Ringer's. Renal ultrasound showed no hydronephrosis. Small  volume right upper quadrant  ascites. No indications for hemodialysis. Infectious disease consulted 07/13/2020 for Klebsiella bacteremia and maintained on [id number] days that has since been completed. CT of abdomen completed to look for renal/ureteric stone that showed 7 to 8 mm proximal right ureteral stone with obstructive change and perinephric stranding. Minimal free fluid within the abdomen and pelvis. Urology consulted underwent cystoscopy as well as bilateral retrograde pyelogram with right and left ureteral stent placement 07/14/2020. Patient would need bilateral outpatient ureteroscopy, laser lithotripsy and stent exchange in 4 to 8 weeks. Patient did remain intubated for a short time. Neurology consulted 07/16/2020 for right side weakness and slurred speech. MRI showed severely limited evaluation with multiple sequences. Diffusion-weighted imaging significantly limited with multiple small areas of DWI hyperintensity in the bilateral white matter representing artifact versus small infarcts. CT angiogram of head and neck limited with no evidence of large vessel occlusion or significant arterial stenosis. MRI later repeated 07/21/2020 unchanged DWI from prior imaging with small vague foci of trace diffusion abnormality suggesting scattered small foci of subacute white matter ischemia. Underlying advanced but nonspecific chronic signal changes in the cerebral white matter in the pons redemonstrated most commonly due to small vessel disease. EEG showed some background slowing with rare triphasic waves noted no seizure activity. Echocardiogram with ejection fraction of 30 to 35% the left ventricle demonstrating global hypokinesis. Echo bubble study no evidence of thrombus and grade 3 diastolic dysfunction. TEE results pending. Podiatry as well as vascular surgery consulted for necrosis dry gangrene of both feet left greater than right 07/20/2020 with ischemic changes. Vascular surgery did not feel any CT angiogram or  intervention was needed and advised to continue to follow.  Patient transferred to CIR on 07/28/2020 .   Clinical Impression  Patient presents with a mild-moderate cognitive-linguistic deficit impacting her short term recall, cognitive processing speed and accuracy, limiting her emergent and anticipatory awareness to her deficits and impacting her overall endurance and ability to maintain attention and consistent performance during complex level functional tasks. Patient was independent with all ADL's prior to this admission. She demonstrated difficulty with organization during verbal problem solving and reasoning but did exhibit some awareness to this. In terms of her overall awareness to deficits, she endorsed poor leg strength, and cognitive issues of "avoid talking, because it stresses me out" and "dont want to read because it tires my brain out". Patient is currently oxygen via nasal cannula which she reports is premorbid. She became noticeably SOB and with increased work of breathing during conversation and cognitive tasks. Patient is in agreement with plan of care and goals of working on complex level cognitive functioning. SLP encouraged her to try to do some reading, etc to challenge her brain. Anticipate she will be able to reach mod I for cognitive-linguistic goals with with skilled SLP intervention during this rehab stay to maximize her safety awareness, reasoning, problem solving and ability to perform ADL's at modified independent level.    Skilled Therapeutic Interventions          Cognistat evaluation, cognitive-linguistic and speech evaluation  SLP Assessment  Patient will need skilled Speech Lanaguage Pathology Services during CIR admission    Recommendations  Recommendations for Other Services: Neuropsych consult Follow up Recommendations: Other (comment);24 hour supervision/assistance (TBD but do not anticipate for SLP)    SLP Frequency 3 to 5 out of 7 days   SLP Duration  SLP  Intensity  SLP Treatment/Interventions 2 weels  Minumum of 1-2 x/day, 30 to  90 minutes  Cognitive remediation/compensation;Multimodal communication approach;Medication managment;Patient/family education;Therapeutic Activities;Functional tasks    Pain  No pain/denies  Prior Functioning    SLP Evaluation Cognition Overall Cognitive Status: Impaired/Different from baseline Arousal/Alertness: Awake/alert Orientation Level: Oriented X4 Attention: Sustained Sustained Attention Impairment: Verbal basic;Verbal complex;Functional basic Memory: Impaired Memory Impairment: Decreased recall of new information;Retrieval deficit;Decreased short term memory Decreased Short Term Memory: Verbal basic;Verbal complex Awareness: Impaired Awareness Impairment: Intellectual impairment;Emergent impairment Problem Solving: Impaired Problem Solving Impairment: Verbal complex Executive Function: Writer: Impaired Organizing Impairment: Verbal complex Self Monitoring: Appears intact Behaviors: Impulsive Safety/Judgment: Appears intact  Comprehension Auditory Comprehension Overall Auditory Comprehension: Appears within functional limits for tasks assessed Expression Expression Primary Mode of Expression: Verbal Verbal Expression Overall Verbal Expression: Appears within functional limits for tasks assessed Oral Motor Oral Motor/Sensory Function Overall Oral Motor/Sensory Function: Within functional limits Motor Speech Overall Motor Speech: Appears within functional limits for tasks assessed  Care Tool Care Tool Cognition Expression of Ideas and Wants Expression of Ideas and Wants: Without difficulty (complex and basic) - expresses complex messages without difficulty and with speech that is clear and easy to understand   Understanding Verbal and Non-Verbal Content Understanding Verbal and Non-Verbal Content: Usually understands - understands most conversations, but misses some  part/intent of message. Requires cues at times to understand   Memory/Recall Ability *first 3 days only Memory/Recall Ability *first 3 days only: Current season;Location of own room;Staff names and faces;That he or she is in a hospital/hospital unit      Short Term Goals: Week 1: SLP Short Term Goal 1 (Week 1): Patient will perform complex level problem solving tasks (medication management, financial management, etc) with minA. SLP Short Term Goal 2 (Week 1): Patient will utilize learned strategies for short term memory/recall and for conserving energy to complete complex level cognitive tasks with minA SLP Short Term Goal 3 (Week 1): Patient will demonstrate adequate organization of thoughts during conversation as well as during completion of functional, multi-step tasks with minA SLP Short Term Goal 4 (Week 1): Patient will demonstrate improved emergent and anticipatory awareness by formulating logical solutions to compensate for deficits during ADL tasks and during planning for future discharge home, with min A  Refer to Care Plan for Long Term Goals  Recommendations for other services: Neuropsych  Discharge Criteria: Patient will be discharged from SLP if patient refuses treatment 3 consecutive times without medical reason, if treatment goals not met, if there is a change in medical status, if patient makes no progress towards goals or if patient is discharged from hospital.  The above assessment, treatment plan, treatment alternatives and goals were discussed and mutually agreed upon: by patient  Sonia Baller, MA, CCC-SLP Speech Therapy

## 2020-07-29 NOTE — Progress Notes (Signed)
Inpatient Rehabilitation Center Individual Statement of Services  Patient Name:  Stacie [name]  Date:  07/29/2020  Welcome to the [facility].  Our goal is to provide you with an individualized program based on your diagnosis and situation, designed to meet your specific needs.  With this comprehensive rehabilitation program, you will be expected to participate in at least 3 hours of rehabilitation therapies Monday-Friday, with modified therapy programming on the weekends.  Your rehabilitation program will include the following services:  Physical Therapy (PT), Occupational Therapy (OT), Speech Therapy (ST), 24 hour per day rehabilitation nursing, Therapeutic Recreaction (TR), Neuropsychology, Care Coordinator, Rehabilitation Medicine, Nutrition Services, Pharmacy Services and Other  Weekly team conferences will be held on Wedndesday to discuss your progress.  Your Inpatient Rehabilitation Care Coordinator will talk with you frequently to get your input and to update you on team discussions.  Team conferences with you and your family in attendance may also be held.  Expected length of stay: 18-21 Days  Overall anticipated outcome: Min A  Depending on your progress and recovery, your program may change. Your Inpatient Rehabilitation Care Coordinator will coordinate services and will keep you informed of any changes. Your Inpatient Rehabilitation Care Coordinator's name and contact numbers are listed  below.  The following services may also be recommended but are not provided by the [facility]:    [place] will be made to provide these services after discharge if needed.  Arrangements include referral to agencies that provide these services.  Your insurance has been verified to be:  BCBS COMM PPO Your primary doctor is:  Halina Maidens, MD  Pertinent information will  be shared with your doctor and your insurance company.  Inpatient Rehabilitation Care Coordinator:  Erlene Quan, [place] or [phone]  Information discussed with and copy given to patient by: Dyanne Iha, 07/29/2020, 11:46 AM

## 2020-07-29 NOTE — Plan of Care (Signed)
  Problem: RH Balance Goal: LTG Patient will maintain dynamic sitting balance (PT) Description: LTG:  Patient will maintain dynamic sitting balance with assistance during mobility activities (PT) Flowsheets (Taken 07/29/2020 1508) LTG: Pt will maintain dynamic sitting balance during mobility activities with:: Independent with assistive device  Goal: LTG Patient will maintain dynamic standing balance (PT) Description: LTG:  Patient will maintain dynamic standing balance with assistance during mobility activities (PT) Flowsheets (Taken 07/29/2020 1508) LTG: Pt will maintain dynamic standing balance during mobility activities with:: Minimal Assistance - Patient > 75%   Problem: RH Bed Mobility Goal: LTG Patient will perform bed mobility with assist (PT) Description: LTG: Patient will perform bed mobility with assistance, with/without cues (PT). Flowsheets (Taken 07/29/2020 1508) LTG: Pt will perform bed mobility with assistance level of: Supervision/Verbal cueing   Problem: RH Bed to Chair Transfers Goal: LTG Patient will perform bed/chair transfers w/assist (PT) Description: LTG: Patient will perform bed to chair transfers with assistance (PT). Flowsheets (Taken 07/29/2020 1508) LTG: Pt will perform Bed to Chair Transfers with assistance level: Minimal Assistance - Patient > 75%   Problem: RH Car Transfers Goal: LTG Patient will perform car transfers with assist (PT) Description: LTG: Patient will perform car transfers with assistance (PT). Flowsheets (Taken 07/29/2020 1508) LTG: Pt will perform car transfers with assist:: Minimal Assistance - Patient > 75%   Problem: RH Ambulation Goal: LTG Patient will ambulate in controlled environment (PT) Description: LTG: Patient will ambulate in a controlled environment, # of feet with assistance (PT). Flowsheets (Taken 07/29/2020 1508) LTG: Pt will ambulate in controlled environ  assist needed:: Minimal Assistance - Patient > 75% LTG:  Ambulation distance in controlled environment: 54f with LRAD   Problem: RH Wheelchair Mobility Goal: LTG Patient will propel w/c in controlled environment (PT) Description: LTG: Patient will propel wheelchair in controlled environment, # of feet with assist (PT) Flowsheets (Taken 07/29/2020 1508) LTG: Pt will propel w/c in controlled environ  assist needed:: Supervision/Verbal cueing LTG: Propel w/c distance in controlled environment: 1545fGoal: LTG Patient will propel w/c in home environment (PT) Description: LTG: Patient will propel wheelchair in home environment, # of feet with assistance (PT). Flowsheets (Taken 07/29/2020 1508) LTG: Pt will propel w/c in home environ  assist needed:: Supervision/Verbal cueing LTG: Propel w/c distance in home environment: 5058f Problem: RH Stairs Goal: LTG Patient will ambulate up and down stairs w/assist (PT) Description: LTG: Patient will ambulate up and down # of stairs with assistance (PT) Flowsheets (Taken 07/29/2020 1508) LTG: Pt will ambulate up/down stairs assist needed:: Minimal Assistance - Patient > 75% LTG: Pt will  ambulate up and down number of stairs: 2 steps with min assist

## 2020-07-30 ENCOUNTER — Inpatient Hospital Stay (HOSPITAL_COMMUNITY): Payer: BC Managed Care – PPO | Admitting: Occupational Therapy

## 2020-07-30 ENCOUNTER — Inpatient Hospital Stay (HOSPITAL_COMMUNITY): Payer: BC Managed Care – PPO | Admitting: Physical Therapy

## 2020-07-30 ENCOUNTER — Inpatient Hospital Stay (HOSPITAL_COMMUNITY): Payer: BC Managed Care – PPO | Admitting: Speech Pathology

## 2020-07-30 LAB — GLUCOSE, CAPILLARY
Glucose-Capillary: 90 mg/dL (ref 70–99)
Glucose-Capillary: 174 mg/dL — ABNORMAL HIGH (ref 70–99)
Glucose-Capillary: 126 mg/dL — ABNORMAL HIGH (ref 70–99)
Glucose-Capillary: 177 mg/dL — ABNORMAL HIGH (ref 70–99)

## 2020-07-30 NOTE — Progress Notes (Signed)
Occupational Therapy Session Note  Patient Details  Name: DAWNYA GRAMS MRN: [id number] Date of Birth: 05-Nov-1955  Today's Date: 07/30/2020 OT Individual Time:  9558- 1114   42 min   Short Term Goals: Week 1:  OT Short Term Goal 1 (Week 1): Pt will participate in seated ADL activity for 10 mins without fatigue or drop in O2 OT Short Term Goal 2 (Week 1): Pt will perform UB ADLs at EOB/sink with CGA OT Short Term Goal 3 (Week 1): Pt will perform LB dress with AE PRN with Mod A OT Short Term Goal 4 (Week 1): Pt will perform toilet transfer with Max of 1  Skilled Therapeutic Interventions/Progress Updates:    Pt greeted at time of session sitting up in wheelchair from previous therapy session this am, agreeable to session and no c/o pain but very fatigued. Pt wanting to wash her hair, set up at sink level and therapist assisted with hair washing task, pt helping occasionally to brush and dry hair but fatigued very quickly and required frequent rest breaks for any activity. Donned new shirt with Mod A in sitting. Remained on O2 at 2L throughout session, no desat. Sit to stand at [place], transferred to bed and able to control descent with mod/max A, sit to supine Max d/t fatigue and to manage BLEs. Scoot up in bed with 2 helpers. Alarm on, call bell in reach, all needs met.   Therapy Documentation Precautions:  Precautions Precautions: Fall Precaution Comments: monitor O2, 2L continuous Restrictions Weight Bearing Restrictions: No     Therapy/Group: Individual Therapy  Viona Gilmore 07/30/2020, 5:14 PM

## 2020-07-30 NOTE — IPOC Note (Signed)
Overall Plan of Care [facility]) Patient Details Name: MARABETH MELLAND MRN: [id number] DOB: November 13, 1955  Admitting Diagnosis: <principal problem not specified>  Hospital Problems: Active Problems:   Sepsis [facility])     Functional Problem List: Nursing Bladder, Bowel, Edema, Endurance, Medication Management, Safety, Pain  PT Balance, Edema, Behavior, Endurance, Motor, Perception, Safety, Sensory, Skin Integrity, Nutrition  OT Balance, Endurance, Motor, Safety, Sensory, Skin Integrity  SLP Cognition, Endurance, Perception  TR         Basic ADL's: OT Grooming, Bathing, Dressing, Toileting     Advanced  ADL's: OT       Transfers: PT Bed Mobility, Bed to Chair, Car, [employer], Futures trader, Metallurgist: PT Ambulation, Emergency planning/management officer, Stairs     Additional Impairments: OT Fuctional Use of Upper Extremity  SLP None      TR      Anticipated Outcomes Item Anticipated Outcome  Self Feeding not addressed  Swallowing  Mod I   Basic self-care  Min A overall  Toileting  Min A   Bathroom Transfers CGA  Bowel/Bladder  manage bladder with min assist; bowel with min assist  Transfers  Min assist with LRAd  Locomotion  Supervision assist WC mobility. min a gait for short distances  Communication  Mod I  Cognition  mod I complex problem solving, awareness, reasoning, memory  Pain  Pain at or below level 4  Safety/Judgment  Maintain safety with cues/reminders   Therapy Plan: PT Intensity: Minimum of 1-2 x/day ,45 to 90 minutes PT Frequency: 5 out of 7 days PT Duration Estimated Length of Stay: 3-4 weeks OT Intensity: Minimum of 1-2 x/day, 45 to 90 minutes OT Frequency: 5 out of 7 days OT Duration/Estimated Length of Stay: 24-28 days SLP Intensity: Minumum of 1-2 x/day, 30 to 90 minutes SLP Frequency: 3 to 5 out of 7 days SLP Duration/Estimated Length of Stay: 2 weels   Due to the current state of emergency, patients may not be receiving their  3-hours of Medicare-mandated therapy.   Team Interventions: Nursing Interventions Patient/Family Education, Pain Management, Medication Management, Bowel Management, Disease Management/Prevention, Skin Care/Wound Management, Discharge Planning  PT interventions Ambulation/gait training, Cognitive remediation/compensation, Discharge planning, DME/adaptive equipment instruction, Functional mobility training, Splinting/orthotics, Psychosocial support, Pain management, Therapeutic Activities, UE/LE Strength taining/ROM, Visual/perceptual remediation/compensation, Wheelchair propulsion/positioning, UE/LE Coordination activities, Therapeutic Exercise, Stair training, Skin care/wound management, Patient/family education, Neuromuscular re-education, Functional electrical stimulation, Disease management/prevention, Academic librarian, Training and development officer  OT Interventions Training and development officer, Discharge planning, Functional electrical stimulation, Self Care/advanced ADL retraining, Pain management, Therapeutic Activities, UE/LE Coordination activities, Visual/perceptual remediation/compensation, Therapeutic Exercise, Patient/family education, Functional mobility training, Disease mangement/prevention, Cognitive remediation/compensation, Skin care/wound managment, Community reintegration, Engineer, drilling, Neuromuscular re-education, Psychosocial support, UE/LE Strength taining/ROM, Wheelchair propulsion/positioning  SLP Interventions Cognitive remediation/compensation, Multimodal communication approach, Medication managment, Patient/family education, Therapeutic Activities, Functional tasks  TR Interventions    SW/CM Interventions Discharge Planning, Psychosocial Support, Patient/Family Education   Barriers to Discharge MD  Medical stability and Neurogenic bowel and bladder  Nursing New diabetic    PT Inaccessible home environment, Decreased caregiver support, Insurance for  SNF coverage    OT Home environment access/layout 1/2 bath main floor, showers upstairs  SLP Decreased caregiver support    SW       Team Discharge Planning: Destination: PT-Home ,OT- Home , SLP-  Projected Follow-up: PT-Home health PT, OT-  Home health OT, SLP-Other (comment), 24 hour supervision/assistance (TBD but do not anticipate for SLP) Projected Equipment  Needs: PT-To be determined, Rolling walker with 5" wheels, OT- To be determined, SLP-  Equipment Details: PT- , OT-  Patient/family involved in discharge planning: PT- Patient,  OT-Patient, Family member/caregiver, SLP-Patient  MD ELOS: 18-21d Medical Rehab Prognosis:  Good Assessment:  64 year old right-handed female with history of arrhythmia, hypertension, asthma, hypothyroidism hard of hearing with hearing aids, pulmonary sarcoidosis on 2 L nasal cannula at baseline  maintained methotrexate as well as chronic prednisone, OSA, IBS, chronic kidney disease with creatinine 1.04-1.11.  Per chart review patient lives with spouse who is a Surveyor, quantity.  Two-level home bed and bath upstairs.  Independent with assistive device.  Presented 07/09/2020 with altered mental status and shortness of breath.  Cranial CT scan showed chronic white matter ischemic changes without acute abnormality.  CT of the chest showed chronic changes of pulmonary sarcoidosis with biapical predominant pulmonary fibrosis.  Very mild superimposed active inflammatory infiltrate.  Ultrasound of the abdomen showed liver echogenicity overall increased in mildly inhomogeneous.  Appearance most likely indicate of of hepatic steatosis.  No focal liver lesions evident.  Admission chemistries BUN 37, creatinine 4.16, lactic acid 5.8, urinalysis negative nitrite, urine culture greater 100,000 Klebsiella, troponin 88, hemoglobin 12.2, WBC 32,900, blood cultures enterobacterales and Klebsiella.  Placed on broad-spectrum antibiotics presently on Rocephin for suspect sepsis  secondary to community-acquired pneumonia and UTI through 07/28/2020 and stop.  Renal service is consulted for AKI placed on lactated Ringer's.  Renal ultrasound showed no hydronephrosis.  Small volume right upper quadrant ascites.  No indications for hemodialysis.  Infectious disease consulted 07/13/2020 for Klebsiella bacteremia and maintained on ceftriaxone x12 days that has since been completed.  CT of abdomen completed to look for renal/ureteric stone that showed 7 to 8 mm proximal right ureteral stone with obstructive change and perinephric stranding.  Minimal free fluid within the abdomen and pelvis.  Urology consulted underwent cystoscopy as well as bilateral retrograde pyelogram with right and left ureteral stent placement 07/14/2020.  Patient would need bilateral outpatient ureteroscopy, laser lithotripsy and stent exchange in 4 to 8 weeks.  Patient did remain intubated for a short time.  Neurology consulted 07/16/2020 for right side weakness and slurred speech.  MRI showed severely limited evaluation with multiple sequences.  Diffusion-weighted imaging significantly limited with multiple small areas of DWI hyperintensity in the bilateral white matter representing artifact versus small infarcts.  CT angiogram of head and neck limited with no evidence of large vessel occlusion or significant arterial stenosis.  MRI later repeated 07/21/2020 unchanged DWI from prior imaging with small vague foci of trace diffusion abnormality suggesting scattered small foci of subacute white matter ischemia.  Underlying advanced but nonspecific chronic signal changes in the cerebral white matter in the pons redemonstrated most commonly due to small vessel disease.  EEG showed some background slowing with rare triphasic waves noted no seizure activity.  Echocardiogram with ejection fraction of 30 to 35% the left ventricle demonstrating global hypokinesis.  Echo bubble study no evidence of thrombus and grade 3 diastolic  dysfunction.  TEE results pending.  Podiatry as well as vascular surgery consulted for necrosis dry gangrene of both feet left greater than right 07/20/2020 with ischemic changes.  Vascular surgery did not feel any CT angiogram or intervention was needed and advised to continue to follow.  HIT had been ruled out.  Currently maintained on subcutaneous heparin for DVT prophylaxis.   Now requiring 24/7 Rehab RN,MD, as well as CIR level PT, OT and SLP.  Treatment team will  focus on ADLs and mobility with goals set at [place]   See Team Conference Notes for weekly updates to the plan of care

## 2020-07-30 NOTE — Progress Notes (Signed)
Physical Therapy Session Note  Patient Details  Name: Stacie [name] MRN: [id number] Date of Birth: December 17, 1955  Today's Date: 07/30/2020 PT Individual [phone] and [phone]   60 min and 39 min  Short Term Goals: Week 1:  PT Short Term Goal 1 (Week 1): Pt will transfer to [facility] with mod assist of 1 PT Short Term Goal 2 (Week 1): Pt will propell WC 164f with supervision assist PT Short Term Goal 3 (Week 1): Pt will initiate gait training PT Short Term Goal 4 (Week 1): Pt will tolerate sitting in 1 hour between therapist  Skilled Therapeutic Interventions/Progress Updates:  Session 1  Pt received supine in bed and agreeable to PT. PT applied compressing wrap for BP management to BLE from midfoot to thigh for orthostatic hypotension management. Supine>sit transfer with mod assist and min cues for sequecnign and proper use of BUE to push in to sitting.   PT assessed orthostatic BP.  Supine: 115/56, HR 101, SPO2 95% Sitting EOB 103/61, HR 108, SPO2 93% Standing in stedy 103/61, HR 121, SPO2 89%  sittingin WC 110/59, HR 109, SPO294%  [facility] from day oom torehab gym with min assist to prevent veer to the R x 1085f Cues for symmetry R and L and increased stroke length.  Sit<>stand in parallel bars with mod assist and RLE blocked. Pt able to advance the RLE 2-3 inches with BUE support on rails, and L knee blocked. Unable to advance the LLE due to hip weakness. Pt reports 8/10 dizziness in standing following step and [facility]   [facility] returned to room and [facility] reach and all needs met.    Session 2.  Pt received supine in bed and agreeable to PT.pt noted to still have LE wraps on BLE. PT removed Bil wraps. Mild edema noted distal to wraps in BLE. PT encouraged BLE elevation overnight to assist with edema management.   PT instructed pt in supine therex BLE:  Heel slides, x10  SAQ, x  10 Hip abduction,  X 12  Ankle PF DF AROM x 20  Bridge through thighs on bolster x 10.  Hip flexion AAROM x 10   Cues for full ROM and decreased eccentric speed intermittently throughout to improve strengthening aspect of movements. PT elevated BLE with pt in supine at end of session with all needs met and daughter present.       Therapy Documentation Precautions:  Precautions Precautions: Fall Precaution Comments: monitor O2, 2L continuous Restrictions Weight Bearing Restrictions: No    Vital Signs: Therapy Vitals Temp: 97.8 F (36.6 C) Pulse Rate: 94 Resp: 19 BP: 140/64 Patient Position (if appropriate): Sitting Oxygen Therapy SpO2: 95 % O2 Device: Nasal Cannula O2 Flow Rate (L/min): 2 L/min Pain: Pain Assessment Pain Scale: 0-10 Pain Score: 0-No pain    Therapy/Group: Individual Therapy  AuLorie Phenix0/14/2021, 9:14 AM

## 2020-07-30 NOTE — Progress Notes (Signed)
Speech Language Pathology Daily Session Note  Patient Details  Name: Stacie [name] MRN: [id number] Date of Birth: 1956-04-25  Today's Date: 07/30/2020 SLP Individual Time: 6184-8592 SLP Individual Time Calculation (min): 40 min  Short Term Goals: Week 1: SLP Short Term Goal 1 (Week 1): Patient will perform complex level problem solving tasks (medication management, financial management, etc) with minA. SLP Short Term Goal 2 (Week 1): Patient will utilize learned strategies for short term memory/recall and for conserving energy to complete complex level cognitive tasks with minA SLP Short Term Goal 3 (Week 1): Patient will demonstrate adequate organization of thoughts during conversation as well as during completion of functional, multi-step tasks with minA SLP Short Term Goal 4 (Week 1): Patient will demonstrate improved emergent and anticipatory awareness by formulating logical solutions to compensate for deficits during ADL tasks and during planning for future discharge home, with min A  Skilled Therapeutic Interventions:   Patient seen for skilled ST session with focus on cognitive-linguistic function. Patient completed reading comprehension of increasing difficulty in text and was able answer recall questions with 80% accuracy but required extra processing time for more abstract questions. She continues to become easily fatigued even with cognitive tasks and 3 different times during session she had to stop and told SLP that she was getting dizzy and thought "it must be from turning my head". SLP informed RN and PT working with her today via secure chat as patient has been having BP issues and vertigo symptoms. Patient continues to benefit from skilled SLP intervention to maximize cognitive-linguistic function prior to discharge.   Pain Pain Assessment Pain Scale: 0-10 Pain Score: 0-No pain  Therapy/Group: Individual Therapy   Sonia Baller, MA, CCC-SLP Speech Therapy

## 2020-07-30 NOTE — Progress Notes (Signed)
[facility] PHYSICAL MEDICINE & REHABILITATION PROGRESS NOTE   Subjective/Complaints:   No foot pain or toe pain, slept ok last night, appetite fair, no constipation  ROS- neg CP, + urinary retention, neg N/V/D   Objective:   No results found. Recent Labs    07/28/20 1536 07/29/20 0709  WBC 14.0* 13.3*  HGB 10.9* 10.9*  HCT 35.4* 35.0*  PLT 170 167   Recent Labs    07/28/20 0425 07/28/20 0425 07/28/20 1536 07/29/20 0709  NA 141  --   --  141  K 4.4  --   --  3.6  CL 104  --   --  102  CO2 29  --   --  30  GLUCOSE 146*  --   --  93  BUN 36*  --   --  28*  CREATININE 1.13*   < > 1.14* 1.02*  CALCIUM 8.4*  --   --  8.7*   < > = values in this interval not displayed.    Intake/Output Summary (Last 24 hours) at 07/30/2020 0814 Last data filed at 07/30/2020 0529 Gross per 24 hour  Intake 558 ml  Output 2575 ml  Net -2017 ml        Physical Exam: Vital Signs Blood pressure 140/64, pulse 94, temperature 97.8 F (36.6 C), resp. rate 19, height _0  (1.727 m), weight 82.7 kg, SpO2 95 %.  General: No acute distress Mood and affect are appropriate Heart: Regular rate and rhythm no rubs murmurs or extra sounds Lungs: Clear to auscultation, breathing unlabored, no rales or wheezes Abdomen: Positive bowel sounds, soft nontender to palpation, nondistended Extremities: No clubbing, cyanosis, or edema Skin: No evidence of breakdown, no evidence of rash     Ext bilateral DP pulses normal   Neurologic: Cranial nerves II through XII intact, motor strength is 5/5 in bilateral deltoid, bicep, tricep, grip,  2- Bilat HF, 3- B KE, ADF/PF   Musculoskeletal:pain free active assisted  range of motion in all 4 extremities. No joint swelling   Assessment/Plan: 1. Functional deficits secondary to debility post urosepsis which require 3+ hours per day of interdisciplinary therapy in a comprehensive inpatient rehab setting.  Physiatrist is providing close team  supervision and 24 hour management of active medical problems listed below.  Physiatrist and rehab team continue to assess barriers to discharge/monitor patient progress toward functional and medical goals  Care Tool:  Bathing    Body parts bathed by patient: Right arm, Left arm, Chest, Abdomen, Right upper leg, Left upper leg, Face   Body parts bathed by helper: Front perineal area, Buttocks, Right lower leg, Left lower leg     Bathing assist Assist Level: Maximal Assistance - Patient 24 - 49%     Upper Body Dressing/Undressing Upper body dressing   What is the patient wearing?: Pull over shirt    Upper body assist Assist Level: Moderate Assistance - Patient 50 - 74%    Lower Body Dressing/Undressing Lower body dressing      What is the patient wearing?: Underwear/pull up, Pants     Lower body assist Assist for lower body dressing: Total Assistance - Patient < 25%     Toileting Toileting Toileting Activity did not occur (Clothing management and hygiene only): N/A (no void or bm)  Toileting assist Assist for toileting: Total Assistance - Patient < 25%     Transfers Chair/bed transfer  Transfers assist     Chair/bed transfer assist level: Dependent - mechanical lift  Locomotion Ambulation   Ambulation assist   Ambulation activity did not occur: Safety/medical concerns          Walk 10 feet activity   Assist  Walk 10 feet activity did not occur: Safety/medical concerns        Walk 50 feet activity   Assist Walk 50 feet with 2 turns activity did not occur: Safety/medical concerns         Walk 150 feet activity   Assist Walk 150 feet activity did not occur: Safety/medical concerns         Walk 10 feet on uneven surface  activity   Assist Walk 10 feet on uneven surfaces activity did not occur: Safety/medical concerns         Wheelchair     Assist     Wheelchair activity did not occur: Safety/medical concerns           Wheelchair 50 feet with 2 turns activity    Assist    Wheelchair 50 feet with 2 turns activity did not occur: Safety/medical concerns       Wheelchair 150 feet activity     Assist  Wheelchair 150 feet activity did not occur: Safety/medical concerns       Blood pressure 140/64, pulse 94, temperature 97.8 F (36.6 C), resp. rate 19, height _0  (1.727 m), weight 82.7 kg, SpO2 95 %.  Medical Problem List and Plan: 1.  Altered mental status with decreased functional mobility secondary to sepsis/encephalopathy patient completed 112 days of ceftriaxone as well as recent course of Rocephin             -patient may shower             -ELOS/Goals: minA 20-24 days   2.  Antithrombotics: -DVT/anticoagulation: Subcutaneous heparin             -antiplatelet therapy: Aspirin 81 mg daily 3. Pain Management: Tylenol as needed. Well controlled.  4. Mood: Lexapro 20 mg daily             -antipsychotic agents: N/A 5. Neuropsych: This patient is capable of making decisions on her own behalf. 6. Skin/Wound Care: Routine skin checks Cyanotic left toes #1,2,3, and R #1, plan for VVS is to monitor  Demarcation and f/u as OP  unchanged vs image from 10/13 7. Fluids/Electrolytes/Nutrition: Routine in and outs with follow-up chemistries 8.  Pulmonary sarcoidosis.  Patient on 2 L nasal cannula baseline.  Follow-up outpatient 9.  Hypertension.  Monitor with increased mobility. Well controlled.  10.  Asthma.  Continue inhalers as directed 11.  Hyperlipidemia.  Lipitor 12.  Hypothyroidism.  Synthroid 13.  AKI.  Follow-up chemistries 14.  Bilateral lower extremity ischemia.  No urgent indication for lower extremity angiogram at present.  Follow-up vascular surgery. Good pulses B dorsalis pedis,likely small vessel occlusions 15.  Urinary retention. Should be able to  DC foley and initiate voiding trial    LOS: 2 days A FACE TO FACE EVALUATION WAS PERFORMED  Charlett Blake 07/30/2020, 8:14 AM

## 2020-07-30 NOTE — Plan of Care (Signed)
  Problem: Consults Goal: RH STROKE PATIENT EDUCATION Description: See Patient Education module for education specifics  Outcome: Progressing   Problem: RH BOWEL ELIMINATION Goal: RH STG MANAGE BOWEL WITH ASSISTANCE Description: STG Manage Bowel with min Assistance. Outcome: Progressing Goal: RH STG MANAGE BOWEL W/MEDICATION W/ASSISTANCE Description: STG Manage Bowel with Medication with mod I Assistance. Outcome: Progressing   Problem: RH BLADDER ELIMINATION Goal: RH STG MANAGE BLADDER WITH ASSISTANCE Description: STG Manage Bladder With min Assistance Outcome: Progressing   Problem: RH SKIN INTEGRITY Goal: RH STG SKIN FREE OF INFECTION/BREAKDOWN Description: With min assist  Outcome: Progressing Goal: RH STG MAINTAIN SKIN INTEGRITY WITH ASSISTANCE Description: STG Maintain Skin Integrity With min Assistance. Outcome: Progressing Goal: RH STG ABLE TO PERFORM INCISION/WOUND CARE W/ASSISTANCE Description: STG Able To Perform Incision/Wound Care With min Assistance. Outcome: Progressing   Problem: RH SAFETY Goal: RH STG ADHERE TO SAFETY PRECAUTIONS W/ASSISTANCE/DEVICE Description: STG Adhere to Safety Precautions With cues/reminders Assistance/Device. Outcome: Progressing   Problem: RH PAIN MANAGEMENT Goal: RH STG PAIN MANAGED AT OR BELOW PT'S PAIN GOAL Description: Pain at or below level 4 Outcome: Progressing   Problem: RH KNOWLEDGE DEFICIT Goal: RH STG INCREASE KNOWLEDGE OF HYPERTENSION Description: Patient will be able to manage HTN with medications and dietary changes using educational materials and hanouts with cues/reminders Outcome: Progressing Goal: RH STG INCREASE KNOWLEGDE OF HYPERLIPIDEMIA Description: Patient will be able to manage HLD with medications and dietary changes using educational materials and hanouts with cues/reminders Outcome: Progressing Goal: RH STG INCREASE KNOWLEDGE OF STROKE PROPHYLAXIS Description: Patient will be able to manage stroke  prevention with medications/dietary changes using handouts and educational materials with cues/reminders Outcome: Progressing

## 2020-07-31 ENCOUNTER — Inpatient Hospital Stay: Payer: BC Managed Care – PPO | Admitting: Internal Medicine

## 2020-07-31 ENCOUNTER — Inpatient Hospital Stay (HOSPITAL_COMMUNITY): Payer: BC Managed Care – PPO | Admitting: Speech Pathology

## 2020-07-31 ENCOUNTER — Inpatient Hospital Stay (HOSPITAL_COMMUNITY): Payer: BC Managed Care – PPO | Admitting: Physical Therapy

## 2020-07-31 ENCOUNTER — Inpatient Hospital Stay (HOSPITAL_COMMUNITY): Payer: BC Managed Care – PPO

## 2020-07-31 LAB — GLUCOSE, CAPILLARY
Glucose-Capillary: 106 mg/dL — ABNORMAL HIGH (ref 70–99)
Glucose-Capillary: 137 mg/dL — ABNORMAL HIGH (ref 70–99)
Glucose-Capillary: 178 mg/dL — ABNORMAL HIGH (ref 70–99)
Glucose-Capillary: 183 mg/dL — ABNORMAL HIGH (ref 70–99)

## 2020-07-31 NOTE — Progress Notes (Signed)
Speech Language Pathology Daily Session Note  Patient Details  Name: Stacie [name] MRN: [id number] Date of Birth: June 21, 1956  Today's Date: 07/31/2020 SLP Individual Time: 0731-0830 SLP Individual Time Calculation (min): 59 min  Short Term Goals: Week 1: SLP Short Term Goal 1 (Week 1): Patient will perform complex level problem solving tasks (medication management, financial management, etc) with minA. SLP Short Term Goal 2 (Week 1): Patient will utilize learned strategies for short term memory/recall and for conserving energy to complete complex level cognitive tasks with minA SLP Short Term Goal 3 (Week 1): Patient will demonstrate adequate organization of thoughts during conversation as well as during completion of functional, multi-step tasks with minA SLP Short Term Goal 4 (Week 1): Patient will demonstrate improved emergent and anticipatory awareness by formulating logical solutions to compensate for deficits during ADL tasks and during planning for future discharge home, with min A  Skilled Therapeutic Interventions: Pt was seen for skilled ST targeting cognitive goals. SLP facilitated session with a complex medication management task, during which pt required overall Min A question cues to recall current medications. Supervision A verbal cues provided for complex problem solving and awareness of 1 error when organizing a BID pill box according to her list of medications. She alternated her attention between tasks and conversations with overall Supervision A verbal cues for redirection and re-organization of thoughts. Pt also anticipated 1 need at discharge in functional conversation with Min A question cues. Pt left laying in bed with alarm set and needs within reach. Continue per current plan of care.          Pain Pain Assessment Pain Scale: 0-10 Pain Score: 0-No pain  Therapy/Group: Individual Therapy  [name] 07/31/2020, 12:09 PM

## 2020-07-31 NOTE — Progress Notes (Signed)
Occupational Therapy Session Note  Patient Details  Name: Stacie [name] MRN: [id number] Date of Birth: May 23, 1956  Today's Date: 07/31/2020 OT Individual Time: 1407-1500 OT Individual Time Calculation (min): 53 min    Short Term Goals: Week 1:  OT Short Term Goal 1 (Week 1): Pt will participate in seated ADL activity for 10 mins without fatigue or drop in O2 OT Short Term Goal 2 (Week 1): Pt will perform UB ADLs at EOB/sink with CGA OT Short Term Goal 3 (Week 1): Pt will perform LB dress with AE PRN with Mod A OT Short Term Goal 4 (Week 1): Pt will perform toilet transfer with Max of 1  Skilled Therapeutic Interventions/Progress Updates:    Pt supine in bed, no c/o pain, just "very tired".  Pt apprehensive about sitting at sink for self care, but agreeable to EOB UB bathing and dressing.  Vitals assessed at beginning of session: BP 126/68, pulse 100, SPO2 on 2L via [place]: 96%.  Pt remained WNL pulse and SPO2 throughout session.  TD donning of ace wrap ankle to knee BLE prior to sitting up. Pt completed supine to sit with min assist.  Education on PLB technique provided due to pt with shallow mouth breathing.  Pt with fair carryover needing frequent VCs to improve follow through.  Pt donned/doffed shirt with min assist overhead.  UB bathing completed with min assist for back.  Pt requesting to return to supine due to fatigue.  Mod assist +2 sit to supine.  Pt completed BUE strengthening and endurance training using min resistive tband to complete 2 x 12 reps shoulder horizontal abduction and rows with RBs in between each set of mod duration.  Call bell in reach, bed alarm on.  Therapy Documentation Precautions:  Precautions Precautions: Fall Precaution Comments: monitor O2, 2L continuous Restrictions Weight Bearing Restrictions: No   Therapy/Group: Individual Therapy  Ezekiel Slocumb 07/31/2020, 5:50 PM

## 2020-07-31 NOTE — Plan of Care (Signed)
  Problem: Consults Goal: RH STROKE PATIENT EDUCATION Description: See Patient Education module for education specifics  Outcome: Progressing   Problem: RH BOWEL ELIMINATION Goal: RH STG MANAGE BOWEL WITH ASSISTANCE Description: STG Manage Bowel with min Assistance. Outcome: Progressing Goal: RH STG MANAGE BOWEL W/MEDICATION W/ASSISTANCE Description: STG Manage Bowel with Medication with mod I Assistance. Outcome: Progressing   Problem: RH BLADDER ELIMINATION Goal: RH STG MANAGE BLADDER WITH ASSISTANCE Description: STG Manage Bladder With min Assistance Outcome: Progressing   Problem: RH SKIN INTEGRITY Goal: RH STG SKIN FREE OF INFECTION/BREAKDOWN Description: With min assist  Outcome: Progressing Goal: RH STG MAINTAIN SKIN INTEGRITY WITH ASSISTANCE Description: STG Maintain Skin Integrity With min Assistance. Outcome: Progressing Goal: RH STG ABLE TO PERFORM INCISION/WOUND CARE W/ASSISTANCE Description: STG Able To Perform Incision/Wound Care With min Assistance. Outcome: Progressing   Problem: RH SAFETY Goal: RH STG ADHERE TO SAFETY PRECAUTIONS W/ASSISTANCE/DEVICE Description: STG Adhere to Safety Precautions With cues/reminders Assistance/Device. Outcome: Progressing   Problem: RH PAIN MANAGEMENT Goal: RH STG PAIN MANAGED AT OR BELOW PT'S PAIN GOAL Description: Pain at or below level 4 Outcome: Progressing   Problem: RH KNOWLEDGE DEFICIT Goal: RH STG INCREASE KNOWLEDGE OF HYPERTENSION Description: Patient will be able to manage HTN with medications and dietary changes using educational materials and hanouts with cues/reminders Outcome: Progressing Goal: RH STG INCREASE KNOWLEGDE OF HYPERLIPIDEMIA Description: Patient will be able to manage HLD with medications and dietary changes using educational materials and hanouts with cues/reminders Outcome: Progressing Goal: RH STG INCREASE KNOWLEDGE OF STROKE PROPHYLAXIS Description: Patient will be able to manage stroke  prevention with medications/dietary changes using handouts and educational materials with cues/reminders Outcome: Progressing   

## 2020-07-31 NOTE — Progress Notes (Signed)
Physical Therapy Session Note  Patient Details  Name: Stacie [name] MRN: [id number] Date of Birth: Feb 12, 1956  Today's Date: 07/31/2020 PT Individual Time: 0933-1030 PT Individual Time Calculation (min): 57 min   Short Term Goals: Week 1:  PT Short Term Goal 1 (Week 1): Pt will transfer to [facility] with mod assist of 1 PT Short Term Goal 2 (Week 1): Pt will propell WC 167f with supervision assist PT Short Term Goal 3 (Week 1): Pt will initiate gait training PT Short Term Goal 4 (Week 1): Pt will tolerate sitting in 1 hour between therapist  Skilled Therapeutic Interventions/Progress Updates:    Pt received supine in bed and agreeable to therapy session - reports sleeping well last night. On 2L of O2 via nasal cannula throughout session - cuing for pursed lip breathing and slow, deep breathing as pt has increased respiratory rate with more mouth breathing - vitals monitored throughout with details below. In supine, donned B LE ACE wraps from ankles to knees for BP management - only worn during session. Supine>sitting R EOB, HOB ~40degrees and using bedrails, with CGA and increased time to complete task - able to scoot to EOB with close SBA. Required seated EOB rest break prior to initiating further mobility. Seated EOB performed B LE long arc quads x15 reps each - cuing for increased extension and activation of ankle DF with the movement. Sit>stand EOB>stedy with +2 light mod assist to come to standing - standing in steady ~30 seconds focusing on increased trunk/hip extension prior to pt reporting LE muscle fatigue requiring seated break. Sit>stand from stedy seat with +2 min assist for lifting into standing and tolerated standing ~10seconds prior to seated rest break EOB. Pt reports need to use bathroom. [facility] Contient of small BM - pt started feeling significant fatigue while sitting on BSC over toilet then starting to have nausea during stedy transfer back to EOB.  Sit>stand BSC over toilet>stedy with heavy +2 mod assist for lifting as pt fatigued and unable to perform trunk/hip/knee extension - mod assist of 1 to maintain standing in steady while +2 performed total assist peri-care and LB clothing management. Stedy transfer back to EOB then due to significant fatigue/nausea performed +2 max assist sit<>stand in stedy then +2 total assist sit>supine as pt not feeling well. Reassessed vitals: BP 129/54 (MAP 77), HR 104bpm, SpO2 97%  After supine rest break pt reports feeling much better but still fatigued. Left supine in bed with needs in reach, lines intact, and bed alarm on.  On 2 L of O2 throughout session Supine: BP 113/60 (MAP 75), HR 101bpm, SpO2 96% Sitting: BP 106/55 (MAP 71), HR 106bpm, SpO2 95% Standing: BP 100/56 (MAP 69), HR 119bpm decreasing to 108bpm, SpO2 96% decreasing to 92% then recovering back to 96% Sitting rest break: BP 116/56 (MAP 72), HR 104bpm, SpO2 98%  Therapy Documentation Precautions:  Precautions Precautions: Fall Precaution Comments: monitor O2, 2L continuous Restrictions Weight Bearing Restrictions: No  Pain:  Denies pain during session.   Therapy/Group: Individual Therapy  CTawana Scale, PT, DPT, CSRS  07/31/2020, 7:54 AM

## 2020-07-31 NOTE — Progress Notes (Addendum)
[place] PHYSICAL MEDICINE & REHABILITATION PROGRESS NOTE   Subjective/Complaints:   Pt with dizziness mainly with position changes, pt feels like it may be BP related  No BM today but feels like she needs to go , tried earlier today without success  ROS- neg CP, + urinary retention, neg N/V/D   Objective:   No results found. Recent Labs    07/28/20 1536 07/29/20 0709  WBC 14.0* 13.3*  HGB 10.9* 10.9*  HCT 35.4* 35.0*  PLT 170 167   Recent Labs    07/28/20 1536 07/29/20 0709  NA  --  141  K  --  3.6  CL  --  102  CO2  --  30  GLUCOSE  --  93  BUN  --  28*  CREATININE 1.14* 1.02*  CALCIUM  --  8.7*    Intake/Output Summary (Last 24 hours) at 07/31/2020 0757 Last data filed at 07/31/2020 0525 Gross per 24 hour  Intake 480 ml  Output 2300 ml  Net -1820 ml        Physical Exam: Vital Signs Blood pressure 116/64, pulse 89, temperature 97.9 F (36.6 C), resp. rate 15, height _0  (1.727 m), weight 79.1 kg, SpO2 99 %.  General: No acute distress Mood and affect are appropriate Heart: Regular rate and rhythm no rubs murmurs or extra sounds Lungs: Clear to auscultation, breathing unlabored, no rales or wheezes Abdomen: Positive bowel sounds, soft nontender to palpation, nondistended Extremities: No clubbing, cyanosis, or edema Skin: No evidence of breakdown, no evidence of rash       Ext bilateral DP pulses normal   Neurologic: Cranial nerves II through XII intact, motor strength is 5/5 in bilateral deltoid, bicep, tricep, grip,  2- Bilat HF, 3- B KE, ADF/PF   Musculoskeletal:pain free active assisted  range of motion in all 4 extremities. No joint swelling   Assessment/Plan: 1. Functional deficits secondary to debility post urosepsis which require 3+ hours per day of interdisciplinary therapy in a comprehensive inpatient rehab setting.  Physiatrist is providing close team supervision and 24 hour management of active medical problems listed  below.  Physiatrist and rehab team continue to assess barriers to discharge/monitor patient progress toward functional and medical goals  Care Tool:  Bathing    Body parts bathed by patient: Right arm, Left arm, Chest, Abdomen, Right upper leg, Left upper leg, Face   Body parts bathed by helper: Front perineal area, Buttocks, Right lower leg, Left lower leg     Bathing assist Assist Level: Maximal Assistance - Patient 24 - 49%     Upper Body Dressing/Undressing Upper body dressing   What is the patient wearing?: Pull over shirt    Upper body assist Assist Level: Moderate Assistance - Patient 50 - 74%    Lower Body Dressing/Undressing Lower body dressing      What is the patient wearing?: Pants, Underwear/pull up     Lower body assist Assist for lower body dressing: Total Assistance - Patient < 25%     Toileting Toileting Toileting Activity did not occur Landscape architect and hygiene only): N/A (no void or bm)  Toileting assist Assist for toileting: Dependent - Patient 0% (foley)     Transfers Chair/bed transfer  Transfers assist     Chair/bed transfer assist level: Dependent - mechanical lift (Stedy)     Locomotion Ambulation   Ambulation assist   Ambulation activity did not occur: Safety/medical concerns  Walk 10 feet activity   Assist  Walk 10 feet activity did not occur: Safety/medical concerns        Walk 50 feet activity   Assist Walk 50 feet with 2 turns activity did not occur: Safety/medical concerns         Walk 150 feet activity   Assist Walk 150 feet activity did not occur: Safety/medical concerns         Walk 10 feet on uneven surface  activity   Assist Walk 10 feet on uneven surfaces activity did not occur: Safety/medical concerns         Wheelchair     Assist     Wheelchair activity did not occur: Safety/medical concerns  Wheelchair assist level: Minimal Assistance - Patient > 75% Max  wheelchair distance: 120    Wheelchair 50 feet with 2 turns activity    Assist    Wheelchair 50 feet with 2 turns activity did not occur: Safety/medical concerns   Assist Level: Minimal Assistance - Patient > 75%   Wheelchair 150 feet activity     Assist  Wheelchair 150 feet activity did not occur: Safety/medical concerns   Assist Level: Minimal Assistance - Patient > 75%   Blood pressure 116/64, pulse 89, temperature 97.9 F (36.6 C), resp. rate 15, height _0  (1.727 m), weight 79.1 kg, SpO2 99 %.  Medical Problem List and Plan: 1.  Altered mental status with decreased functional mobility secondary to sepsis/encephalopathy patient completed 12 days of ceftriaxone as well as recent course of Rocephin             -patient may shower             -ELOS/Goals: minA 20-24 days   2.  Antithrombotics: -DVT/anticoagulation: Subcutaneous heparin             -antiplatelet therapy: Aspirin 81 mg daily 3. Pain Management: Tylenol as needed. Well controlled.  4. Mood: Lexapro 20 mg daily             -antipsychotic agents: N/A 5. Neuropsych: This patient is capable of making decisions on her own behalf. 6. Skin/Wound Care: Routine skin checks Cyanotic left toes #1,2,3, and R #1, plan for VVS is to monitor  Demarcation and f/u as OP  unchanged cyanosis but Left #1MTP with bulla-monitor 7. Fluids/Electrolytes/Nutrition: Routine in and outs with follow-up chemistries 8.  Pulmonary sarcoidosis.  Patient on 2 L nasal cannula baseline.  Follow-up outpatient 9.  Hypertension.  Monitor with increased mobility. Well controlled.  Vitals:   07/31/20 0758 07/31/20 0759  BP:    Pulse:    Resp:    Temp:    SpO2: 99% 99%  order orthostatic vitals 10.  Asthma.  Continue inhalers as directed 11.  Hyperlipidemia.  Lipitor 12.  Hypothyroidism.  Synthroid 13.  AKI.  Follow-up chemistries 14.  Bilateral lower extremity ischemia.  No urgent indication for lower extremity angiogram at  present.  Follow-up vascular surgery. Good pulses B dorsalis pedis,likely small vessel occlusions 15.  Urinary retention. Should be able to  DC foley and initiate voiding trial- would like mobility to improve to allow toileting at least on BSC, pt does not recall difficulty with I/O caths  16.  Leukocytosis associated with bacteremia will cont to monitor trending downward 17.  Constipation- try supp if no results today  LOS: 3 days A FACE TO FACE EVALUATION WAS PERFORMED  Charlett Blake 07/31/2020, 7:57 AM

## 2020-08-01 ENCOUNTER — Inpatient Hospital Stay (HOSPITAL_COMMUNITY): Payer: BC Managed Care – PPO | Admitting: Occupational Therapy

## 2020-08-01 ENCOUNTER — Inpatient Hospital Stay (HOSPITAL_COMMUNITY): Payer: BC Managed Care – PPO | Admitting: Speech Pathology

## 2020-08-01 ENCOUNTER — Inpatient Hospital Stay (HOSPITAL_COMMUNITY): Payer: BC Managed Care – PPO | Admitting: Physical Therapy

## 2020-08-01 LAB — GLUCOSE, CAPILLARY
Glucose-Capillary: 93 mg/dL (ref 70–99)
Glucose-Capillary: 140 mg/dL — ABNORMAL HIGH (ref 70–99)
Glucose-Capillary: 218 mg/dL — ABNORMAL HIGH (ref 70–99)
Glucose-Capillary: 142 mg/dL — ABNORMAL HIGH (ref 70–99)

## 2020-08-01 NOTE — Progress Notes (Signed)
[place] PHYSICAL MEDICINE & REHABILITATION PROGRESS NOTE   Subjective/Complaints:  No issues overnite wearing post op shoe LLE  No LE pain   ROS- neg CP, + urinary retention, neg N/V/D   Objective:   No results found. No results for input(s): WBC, HGB, HCT, PLT in the last 72 hours. No results for input(s): NA, K, CL, CO2, GLUCOSE, BUN, CREATININE, CALCIUM in the last 72 hours.  Intake/Output Summary (Last 24 hours) at 08/01/2020 1030 Last data filed at 08/01/2020 0538 Gross per 24 hour  Intake 240 ml  Output 775 ml  Net -535 ml        Physical Exam: Vital Signs Blood pressure (!) 120/58, pulse 88, temperature 97.7 F (36.5 C), resp. rate 20, height _0  (1.727 m), weight 77.5 kg, SpO2 98 %.   General: No acute distress Mood and affect are appropriate Heart: Regular rate and rhythm no rubs murmurs or extra sounds Lungs: Clear to auscultation, breathing unlabored, no rales or wheezes Abdomen: Positive bowel sounds, soft nontender to palpation, nondistended Extremities: No clubbing, cyanosis, or edema Skin:cyanosis Right distal great toe, Left 2nd, 3rd toes and MTP area not progressing         Ext bilateral DP pulses normal   Neurologic: Cranial nerves II through XII intact, motor strength is 5/5 in bilateral deltoid, bicep, tricep, grip,  2- Bilat HF, 3- B KE, ADF/PF   Musculoskeletal:pain free active assisted  range of motion in all 4 extremities. No joint swelling   Assessment/Plan: 1. Functional deficits secondary to debility post urosepsis which require 3+ hours per day of interdisciplinary therapy in a comprehensive inpatient rehab setting.  Physiatrist is providing close team supervision and 24 hour management of active medical problems listed below.  Physiatrist and rehab team continue to assess barriers to discharge/monitor patient progress toward functional and medical goals  Care Tool:  Bathing    Body parts bathed by patient: Right  arm, Left arm, Chest, Abdomen   Body parts bathed by helper: Front perineal area, Buttocks, Right lower leg, Left lower leg     Bathing assist Assist Level: Supervision/Verbal cueing     Upper Body Dressing/Undressing Upper body dressing   What is the patient wearing?: Pull over shirt    Upper body assist Assist Level: Minimal Assistance - Patient > 75%    Lower Body Dressing/Undressing Lower body dressing      What is the patient wearing?: Underwear/pull up, Pants     Lower body assist Assist for lower body dressing: Total Assistance - Patient < 25%     Toileting Toileting Toileting Activity did not occur Landscape architect and hygiene only): N/A (no void or bm)  Toileting assist Assist for toileting: Dependent - Patient 0% (foley)     Transfers Chair/bed transfer  Transfers assist     Chair/bed transfer assist level: Dependent - mechanical lift (Stedy)     Locomotion Ambulation   Ambulation assist   Ambulation activity did not occur: Safety/medical concerns          Walk 10 feet activity   Assist  Walk 10 feet activity did not occur: Safety/medical concerns        Walk 50 feet activity   Assist Walk 50 feet with 2 turns activity did not occur: Safety/medical concerns         Walk 150 feet activity   Assist Walk 150 feet activity did not occur: Safety/medical concerns         Walk 10 feet  on uneven surface  activity   Assist Walk 10 feet on uneven surfaces activity did not occur: Safety/medical concerns         Wheelchair     Assist Will patient use wheelchair at discharge?: Yes (Per PT long term goals ) Type of Wheelchair: Manual Wheelchair activity did not occur: Safety/medical concerns  Wheelchair assist level: Minimal Assistance - Patient > 75% Max wheelchair distance: 120    Wheelchair 50 feet with 2 turns activity    Assist    Wheelchair 50 feet with 2 turns activity did not occur: Safety/medical  concerns   Assist Level: Minimal Assistance - Patient > 75%   Wheelchair 150 feet activity     Assist  Wheelchair 150 feet activity did not occur: Safety/medical concerns   Assist Level: Minimal Assistance - Patient > 75%   Blood pressure (!) 120/58, pulse 88, temperature 97.7 F (36.5 C), resp. rate 20, height _0  (1.727 m), weight 77.5 kg, SpO2 98 %.  Medical Problem List and Plan: 1.  Altered mental status with decreased functional mobility secondary to sepsis/encephalopathy             -patient may shower             -ELOS/Goals: minA 20-24 days   2.  Antithrombotics: -DVT/anticoagulation: Subcutaneous heparin             -antiplatelet therapy: Aspirin 81 mg daily 3. Pain Management: Tylenol as needed. Well controlled.  4. Mood: Lexapro 20 mg daily             -antipsychotic agents: N/A 5. Neuropsych: This patient is capable of making decisions on her own behalf. 6. Skin/Wound Care: Routine skin checks Peripheral vasc disease vs small veseel embolic infarcts related to sepsis Cyanotic left toes #1,2,3, and R #1, plan for VVS is to monitor  Demarcation and f/u as OP  unchanged cyanosis but Left #1MTP with bulla-monitor, post op sandal LLE  7. Fluids/Electrolytes/Nutrition: Routine in and outs with follow-up chemistries 8.  Pulmonary sarcoidosis.  Patient on 2 L nasal cannula baseline.  Follow-up outpatient 9.  Hypertension.  Monitor with increased mobility. Well controlled.  Vitals:   07/31/20 2005 08/01/20 0530  BP: 111/69 (!) 120/58  Pulse: 95 88  Resp: 20 20  Temp: (!) 97.4 F (36.3 C) 97.7 F (36.5 C)  SpO2: 97% 98%  order orthostatic vitals 10.  Asthma.  Continue inhalers as directed 11.  Hyperlipidemia.  Lipitor 12.  Hypothyroidism.  Synthroid 13.  AKI.  Follow-up chemistries 14.  Bilateral lower extremity ischemia.  No urgent indication for lower extremity angiogram at present.  Follow-up vascular surgery. Good pulses B dorsalis pedis,likely small  vessel occlusions 15.  Urinary retention. Should be able to  DC foley and initiate voiding trial- would like mobility to improve to allow toileting at least on BSC, pt does not recall difficulty with I/O caths  16.  Leukocytosis associated with bacteremia will cont to monitor trending downward 17.  Constipation- try supp if no results today  LOS: 4 days A FACE TO FACE EVALUATION WAS PERFORMED  Charlett Blake 08/01/2020, 10:30 AM

## 2020-08-01 NOTE — Progress Notes (Signed)
Occupational Therapy Session Note  Patient Details  Name: Stacie [name] MRN: [id number] Date of Birth: [birth year]  Today's Date: 08/01/2020 OT Individual Time: 0820-0920 OT Individual Time Calculation (min): 60 min    Short Term Goals: Week 1:  OT Short Term Goal 1 (Week 1): Pt will participate in seated ADL activity for 10 mins without fatigue or drop in O2 OT Short Term Goal 2 (Week 1): Pt will perform UB ADLs at EOB/sink with CGA OT Short Term Goal 3 (Week 1): Pt will perform LB dress with AE PRN with Mod A OT Short Term Goal 4 (Week 1): Pt will perform toilet transfer with Max of 1  Skilled Therapeutic Interventions/Progress Updates:    fous session:  Deep breatheining and energy conservation.  Patient sted she is a mouth breather and had difficulty breathing in oxygen and air through her nose.   Patient concurred to keep working and did not refuse inspite of exhibiting rapid breathing and stopping to rest after every 30 seconds to 3 minutes activity.    She was reminded to try to breathe in through nose rather than mouth and when she did so, she did not exhibit rapid breathing or shortness of breath.    Her heart rate stayed around 107 and her O2 97 with activity.   Otherwise, she particpiated as follows:  Supine to edge of bed transfer=moderate assistance Able to maintain static sitting balance in prep for sit to stand at [facility] with close S Sit to stand to enter steady = moderate assistance with a 2nd person standing by for safety and in case of decreased stregth once she fatigued Steady to w/c= Min A and she plopped into chair unable to maintain load on her legs and stated she was fatigued Oral care in w/c at sink= supervision Patient stated she had already bathed and dressed earlier and at this ponit was too fatigued to practice dressing again or complete toilet transfers or any more sit to stand or standing activities. She concurred to work on diaphramtic breathing, posture to  help extend thoracic region to increase lung capacity and ease breathing while she completed gentle endurance exercises seated as she was able.  At end of session patient left in w/c with alrm egnaged and call bell within reach.  Continue OT Plan of care incorporating energy conservation and remind patient of breathing technique to increase her endurance, self care and attainment toward goals.  Therapy Documentation Precautions:  Precautions Precautions: Fall Precaution Comments: monitor O2, 2L continuous Restrictions Weight Bearing Restrictions: No General:   Vital Signs: o2=97 with activityand 2 L oxygen Oxygen Therapy O2 Device: Nasal Cannula O2 Flow Rate (L/min): 2 L/min Pain:denied   ADL: ADL Upper Body Bathing: Moderate assistance Where Assessed-Upper Body Bathing: Edge of bed Lower Body Bathing: Maximal assistance Where Assessed-Lower Body Bathing: Edge of bed Upper Body Dressing: Moderate assistance Where Assessed-Upper Body Dressing: Edge of bed Lower Body Dressing: Dependent Where Assessed-Lower Body Dressing: Bed level ADL Comments: bed level ADLs at eval d/t decreased endurance/tolerance Vision   Perception    Praxis   Exercises:   Other Treatments:     Therapy/Group: Individual Therapy  Alfredia Ferguson [facility] 08/01/2020, 9:55 AM

## 2020-08-01 NOTE — Progress Notes (Signed)
Speech Language Pathology Daily Session Note  Patient Details  Name: Stacie [name] MRN: [id number] Date of Birth: [birth year]  Today's Date: 08/01/2020 SLP Individual Time: 7782-4235 SLP Individual Time Calculation (min): 40 min  Short Term Goals: Week 1: SLP Short Term Goal 1 (Week 1): Patient will perform complex level problem solving tasks (medication management, financial management, etc) with minA. SLP Short Term Goal 2 (Week 1): Patient will utilize learned strategies for short term memory/recall and for conserving energy to complete complex level cognitive tasks with minA SLP Short Term Goal 3 (Week 1): Patient will demonstrate adequate organization of thoughts during conversation as well as during completion of functional, multi-step tasks with minA SLP Short Term Goal 4 (Week 1): Patient will demonstrate improved emergent and anticipatory awareness by formulating logical solutions to compensate for deficits during ADL tasks and during planning for future discharge home, with min A  Skilled Therapeutic Interventions: Skilled treatment session focused on cognitive goals. Upon arrival, patient was upright in the wheelchair and appeared lethargic. SLP facilitated session by providing extra time and overall Min A verbal cues for problem solving during a complex organization task. Patient requested to into bed at end of session and required Min verbal cues for problem solving and safety with the transfer via the [facility]. Patient left upright in bed with alarm on and all needs within reach. Continue with current plan of care.      Pain Pain Assessment Pain Scale: 0-10 Pain Score: 0-No pain  Therapy/Group: Individual Therapy  Ido Wollman 08/01/2020, 1:16 PM

## 2020-08-01 NOTE — Progress Notes (Signed)
Occupational Therapy Session Note  Patient Details  Name: Stacie [name] MRN: [id number] Date of Birth: [birth year]  Today's Date: 08/01/2020 OT Individual Time:  - 1300-1308  = 8 individual minutes      Short Term Goals: Week 1:  OT Short Term Goal 1 (Week 1): Pt will participate in seated ADL activity for 10 mins without fatigue or drop in O2 OT Short Term Goal 2 (Week 1): Pt will perform UB ADLs at EOB/sink with CGA OT Short Term Goal 3 (Week 1): Pt will perform LB dress with AE PRN with Mod A OT Short Term Goal 4 (Week 1): Pt will perform toilet transfer with Max of 1  Skilled Therapeutic Interventions/Progress Updates: c/o fatigued but concurred to energy conservation training and endurance training within tolerace but quickly fatigued and resumed  Napping  Continue POC as patient energy and medical status allow     Therapy Documentation Precautions:  Precautions Precautions: Fall Precaution Comments: monitor O2, 2L continuous Restrictions Weight Bearing Restrictions: No General: Missed22 minutes  Pain: Pain Assessment Pain Scale: 0-10 Pain Score: 0-No pain s:     Therapy/Group: Individual Therapy  Alfredia Ferguson [facility] 08/01/2020, 1:56 PM

## 2020-08-01 NOTE — Progress Notes (Signed)
Physical Therapy Session Note  Patient Details  Name: Stacie [name] MRN: [id number] Date of Birth: January 17, 1956  Today's Date: 08/01/2020 PT Individual Time: 0950-1050 PT Individual Time Calculation (min): 60 min   Short Term Goals: Week 1:  PT Short Term Goal 1 (Week 1): Pt will transfer to [facility] with mod assist of 1 PT Short Term Goal 2 (Week 1): Pt will propell WC 165f with supervision assist PT Short Term Goal 3 (Week 1): Pt will initiate gait training PT Short Term Goal 4 (Week 1): Pt will tolerate sitting in 1 hour between therapist   Skilled Therapeutic Interventions/Progress Updates:   Pt received sitting in WC and agreeable to PT. WC mobility through hall with supervision assist x 1598fand cues for improved turning technique. Spoke with MD who recommends use of post-op darco shoe for protection of digits 2 and 3 on the L foot. PT applied pressure sensor shoe on the L foot for protection until post-op shoe available.   Sit<>stand x 2  in parallel bars for orthostatic assessment on 2L/min supplemental O2. Mod assist into standing with increased time and BLE on rails  Sitting 112//50, 100%,  HR 102. Standing, 100/68, 84%, HR 114 Sitting 11/49, 99% after 20seconds, HR 107.   Pt then performed gait training in parallel bars x 88f20fith min assist and +2 for WC follow. Pt maintain on 3L/min supplemental O2 with gait. desat to 77% and increased back to 98% after 30sec rest. In WC. Pt noted to be able to clear BLE with limb advancement at this time. Will need to use DF aid once darco shoe available to improve step height.   PT instructed pt in seated BLE strengthening exercises:  DF x 20 LAQ x 12 Hip abduction with manual resistance x 10  Hip extension with manual resistance x 10  Hip flexion with AAROM x 10  Min cues for increased hold at end range as well as full ROM intermittently from PT.   Patient returned to room and [facility] reach and all needs  met.          Therapy Documentation Precautions:  Precautions Precautions: Fall Precaution Comments: monitor O2, 2L continuous Restrictions Weight Bearing Restrictions: No Vital Signs: Oxygen Therapy O2 Device: Nasal Cannula O2 Flow Rate (L/min): 2 L/min Pain: Pain Assessment Pain Scale: 0-10 Pain Score: 0-No pain    Therapy/Group: Individual Therapy  AusLorie Phenix/16/2021, 10:52 AM

## 2020-08-01 NOTE — Progress Notes (Signed)
Orthopedic Tech Progress Note Patient Details:  Stacie [name] [birth year] [id number] Gave RN a pair OF POST OP SHOES for patient. Patient sleeping at this time of hour, told RN that shoes to be worn when patient is up out of bed  Ortho Devices Type of Ortho Device: Postop shoe/boot Ortho Device/Splint Location: BLE Ortho Device/Splint Interventions: Ordered, Other (comment)   Post Interventions Patient Tolerated: Well Instructions Provided: Care of device   Janit Pagan 08/01/2020, 11:05 PM

## 2020-08-02 LAB — GLUCOSE, CAPILLARY
Glucose-Capillary: 101 mg/dL — ABNORMAL HIGH (ref 70–99)
Glucose-Capillary: 105 mg/dL — ABNORMAL HIGH (ref 70–99)
Glucose-Capillary: 203 mg/dL — ABNORMAL HIGH (ref 70–99)
Glucose-Capillary: 195 mg/dL — ABNORMAL HIGH (ref 70–99)
Glucose-Capillary: 192 mg/dL — ABNORMAL HIGH (ref 70–99)

## 2020-08-02 NOTE — Progress Notes (Signed)
[facility] PHYSICAL MEDICINE & REHABILITATION PROGRESS NOTE   Subjective/Complaints: Spoke to pt's husband today, 2 person assist not available at home No foot pain some tingling in Left toes  ROS- neg CP, + urinary retention, neg N/V/D   Objective:   No results found. No results for input(s): WBC, HGB, HCT, PLT in the last 72 hours. No results for input(s): NA, K, CL, CO2, GLUCOSE, BUN, CREATININE, CALCIUM in the last 72 hours.  Intake/Output Summary (Last 24 hours) at 08/02/2020 0857 Last data filed at 08/02/2020 0814 Gross per 24 hour  Intake 476 ml  Output 3590 ml  Net -3114 ml        Physical Exam: Vital Signs Blood pressure (!) 119/57, pulse 87, temperature 97.6 F (36.4 C), temperature source Oral, resp. rate 20, height _0  (1.727 m), weight 77.5 kg, SpO2 98 %.   General: No acute distress Mood and affect are appropriate Heart: Regular rate and rhythm no rubs murmurs or extra sounds Lungs: Clear to auscultation, breathing unlabored, no rales or wheezes Abdomen: Positive bowel sounds, soft nontender to palpation, nondistended Extremities: No clubbing, cyanosis, or edema Skin:cyanosis Right distal great toe, Left 2nd, 3rd toes and MTP area not progressing         Ext bilateral DP pulses normal  10/17    Neurologic: Cranial nerves II through XII intact, motor strength is 5/5 in bilateral deltoid, bicep, tricep, grip,  2- Bilat HF, 3- B KE, ADF/PF   Musculoskeletal:pain free active assisted  range of motion in all 4 extremities. No joint swelling   Assessment/Plan: 1. Functional deficits secondary to debility post urosepsis which require 3+ hours per day of interdisciplinary therapy in a comprehensive inpatient rehab setting.  Physiatrist is providing close team supervision and 24 hour management of active medical problems listed below.  Physiatrist and rehab team continue to assess barriers to discharge/monitor patient progress toward functional  and medical goals  Care Tool:  Bathing    Body parts bathed by patient: Right arm, Left arm, Chest, Abdomen   Body parts bathed by helper: Front perineal area, Buttocks, Right lower leg, Left lower leg     Bathing assist Assist Level: Supervision/Verbal cueing     Upper Body Dressing/Undressing Upper body dressing   What is the patient wearing?: Pull over shirt    Upper body assist Assist Level: Minimal Assistance - Patient > 75%    Lower Body Dressing/Undressing Lower body dressing      What is the patient wearing?: Underwear/pull up, Pants     Lower body assist Assist for lower body dressing: Total Assistance - Patient < 25%     Toileting Toileting Toileting Activity did not occur Landscape architect and hygiene only): N/A (no void or bm)  Toileting assist Assist for toileting: Dependent - Patient 0% (foley)     Transfers Chair/bed transfer  Transfers assist     Chair/bed transfer assist level: Dependent - mechanical lift (Stedy)     Locomotion Ambulation   Ambulation assist   Ambulation activity did not occur: Safety/medical concerns          Walk 10 feet activity   Assist  Walk 10 feet activity did not occur: Safety/medical concerns        Walk 50 feet activity   Assist Walk 50 feet with 2 turns activity did not occur: Safety/medical concerns         Walk 150 feet activity   Assist Walk 150 feet activity did not occur: Safety/medical  concerns         Walk 10 feet on uneven surface  activity   Assist Walk 10 feet on uneven surfaces activity did not occur: Safety/medical concerns         Wheelchair     Assist Will patient use wheelchair at discharge?: Yes (Per PT long term goals ) Type of Wheelchair: Manual Wheelchair activity did not occur: Safety/medical concerns  Wheelchair assist level: Minimal Assistance - Patient > 75% Max wheelchair distance: 120    Wheelchair 50 feet with 2 turns  activity    Assist    Wheelchair 50 feet with 2 turns activity did not occur: Safety/medical concerns   Assist Level: Minimal Assistance - Patient > 75%   Wheelchair 150 feet activity     Assist  Wheelchair 150 feet activity did not occur: Safety/medical concerns   Assist Level: Minimal Assistance - Patient > 75%   Blood pressure (!) 119/57, pulse 87, temperature 97.6 F (36.4 C), temperature source Oral, resp. rate 20, height _0  (1.727 m), weight 77.5 kg, SpO2 98 %.  Medical Problem List and Plan: 1.  Altered mental status with decreased functional mobility secondary to sepsis/encephalopathy             -patient may shower             -ELOS/Goals: minA 20-24 days   2.  Antithrombotics: -DVT/anticoagulation: Subcutaneous heparin             -antiplatelet therapy: Aspirin 81 mg daily 3. Pain Management: Tylenol as needed. Well controlled.  4. Mood: Lexapro 20 mg daily             -antipsychotic agents: N/A 5. Neuropsych: This patient is capable of making decisions on her own behalf. 6. Skin/Wound Care: Routine skin checks Peripheral vasc disease vs small veseel embolic infarcts related to sepsis Cyanotic left toes #1,2,3, and R #1, plan for VVS is to monitor  Demarcation and f/u as OP  unchanged cyanosis but Left #1MTP with bulla-monitor, post op sandal LLE  7. Fluids/Electrolytes/Nutrition: Routine in and outs with follow-up chemistries 8.  Pulmonary sarcoidosis.  Patient on 2 L nasal cannula baseline.  Follow-up outpatient 9.  Hypertension.  Monitor with increased mobility. Well controlled.  Vitals:   08/02/20 0456 08/02/20 0723  BP: (!) 119/57   Pulse: 87   Resp: 20   Temp: 97.6 F (36.4 C)   SpO2: 100% 98%  controlled 10/17 10.  Asthma.  Continue inhalers as directed 11.  Hyperlipidemia.  Lipitor 12.  Hypothyroidism.  Synthroid 13.  AKI.  Follow-up chemistries 14.  Bilateral lower extremity ischemia.  No urgent indication for lower extremity angiogram at  present.  Follow-up vascular surgery. Good pulses B dorsalis pedis,likely small vessel occlusions 15.  Urinary retention. Should be able to  DC foley and initiate voiding trial- would like mobility to improve to allow toileting at least on BSC, pt does not recall difficulty with I/O caths  16.  Leukocytosis associated with bacteremia will cont to monitor trending downward 17.  Constipation- try supp if no results today  LOS: 5 days A FACE TO FACE EVALUATION WAS PERFORMED  Charlett Blake 08/02/2020, 8:57 AM

## 2020-08-03 ENCOUNTER — Inpatient Hospital Stay (HOSPITAL_COMMUNITY): Payer: BC Managed Care – PPO

## 2020-08-03 ENCOUNTER — Inpatient Hospital Stay (HOSPITAL_COMMUNITY): Payer: BC Managed Care – PPO | Admitting: Occupational Therapy

## 2020-08-03 ENCOUNTER — Inpatient Hospital Stay (HOSPITAL_COMMUNITY): Payer: BC Managed Care – PPO | Admitting: Speech Pathology

## 2020-08-03 DIAGNOSIS — I6340 Cerebral infarction due to embolism of unspecified cerebral artery: Secondary | ICD-10-CM

## 2020-08-03 DIAGNOSIS — R6520 Severe sepsis without septic shock: Secondary | ICD-10-CM

## 2020-08-03 DIAGNOSIS — A021 Salmonella sepsis: Secondary | ICD-10-CM

## 2020-08-03 LAB — CBC WITH DIFFERENTIAL/PLATELET
Abs Immature Granulocytes: 0.19 10*3/uL — ABNORMAL HIGH (ref 0.00–0.07)
Basophils Absolute: 0 10*3/uL (ref 0.0–0.1)
Basophils Relative: 0 %
Eosinophils Absolute: 0.2 10*3/uL (ref 0.0–0.5)
Eosinophils Relative: 4 %
HCT: 32.2 % — ABNORMAL LOW (ref 36.0–46.0)
Hemoglobin: 10.1 g/dL — ABNORMAL LOW (ref 12.0–15.0)
Immature Granulocytes: 3 %
Lymphocytes Relative: 18 %
Lymphs Abs: 1.1 10*3/uL (ref 0.7–4.0)
MCH: 31.2 pg (ref 26.0–34.0)
MCHC: 31.4 g/dL (ref 30.0–36.0)
MCV: 99.4 fL (ref 80.0–100.0)
Monocytes Absolute: 0.4 10*3/uL (ref 0.1–1.0)
Monocytes Relative: 6 %
Neutro Abs: 4.1 10*3/uL (ref 1.7–7.7)
Neutrophils Relative %: 69 %
Platelets: 130 10*3/uL — ABNORMAL LOW (ref 150–400)
RBC: 3.24 MIL/uL — ABNORMAL LOW (ref 3.87–5.11)
RDW: 14.9 % (ref 11.5–15.5)
WBC: 6 10*3/uL (ref 4.0–10.5)
nRBC: 0 % (ref 0.0–0.2)

## 2020-08-03 LAB — GLUCOSE, CAPILLARY
Glucose-Capillary: 93 mg/dL (ref 70–99)
Glucose-Capillary: 171 mg/dL — ABNORMAL HIGH (ref 70–99)
Glucose-Capillary: 199 mg/dL — ABNORMAL HIGH (ref 70–99)
Glucose-Capillary: 212 mg/dL — ABNORMAL HIGH (ref 70–99)

## 2020-08-03 NOTE — Progress Notes (Signed)
Physical Therapy Session Note  Patient Details  Name: Stacie [name] MRN: [id number] Date of Birth: June 23, 1956  Today's Date: 08/03/2020 PT Individual Time: 2707-8675 PT Individual Time Calculation (min): 28 min   Short Term Goals: Week 1:  PT Short Term Goal 1 (Week 1): Pt will transfer to [facility] with mod assist of 1 PT Short Term Goal 2 (Week 1): Pt will propell WC 119f with supervision assist PT Short Term Goal 3 (Week 1): Pt will initiate gait training PT Short Term Goal 4 (Week 1): Pt will tolerate sitting in 1 hour between therapist  Skilled Therapeutic Interventions/Progress Updates:    [place]. Pt denied pain at start of session however reported low back pain 4/10 at end of session, nursing made aware and pt positioned for comfort. [facility]. Pt directed in kinatron for total of 8 mins with x5 1 min reps at 70% then on final rep 80%. Pt required rest breaks intermittently during session 2/2 fatigue however O2 monitored to 99%-96%. Pt directed in [facility] at min A x2 initially improved to min A x1 for 3 reps. Pt directed in static standing with BUE support post each stand until fatigue for improved standing tolerance and BLE strengthening to progress with standing mobility. Pt required prolonged rest break post fourth rep and requested to lay day at end of session. [facility] in Steady transfer to bed side, mod A x2 to come to standing to prep [facility] then [facility]. Pt directed in sit>supine min A for BLE management and left in bed, alarm set, All needs in reach and in good condition. Call light in hand.    Therapy Documentation Precautions:  Precautions Precautions: Fall Precaution Comments: monitor O2, 2L continuous Restrictions Weight Bearing Restrictions: No Vital Signs: Oxygen Therapy SpO2: 99 % O2 Device: Nasal Cannula O2 Flow Rate  (L/min): 2 L/min Pain: Pain Assessment Pain Scale: 0-10 Pain Score: 4  Pain Type: Chronic pain Pain Location: Back Pain Descriptors / Indicators: Sore Pain Frequency: Intermittent Pain Onset: On-going Patients Stated Pain Goal: 2 Pain Intervention(s): Medication (See eMAR)    Therapy/Group: Individual Therapy  HJunie Panning10/18/2021, 12:06 PM

## 2020-08-03 NOTE — Progress Notes (Signed)
Physical Therapy Session Note  Patient Details  Name: Stacie [name] MRN: [id number] Date of Birth: Mar 07, 1956  Today's Date: 08/03/2020 PT Individual Time: 1300-1345 PT Individual Time Calculation (min): 45 min   Short Term Goals: Week 1:  PT Short Term Goal 1 (Week 1): Pt will transfer to [facility] with mod assist of 1 PT Short Term Goal 2 (Week 1): Pt will propell WC 193f with supervision assist PT Short Term Goal 3 (Week 1): Pt will initiate gait training PT Short Term Goal 4 (Week 1): Pt will tolerate sitting in 1 hour between therapist  Skilled Therapeutic Interventions/Progress Updates:    [facility]. Pt directed in x3 STS in // with pt pulling self with B bars to standing, mod A improving to min A from WC with VC for sequencing and setup. Pt then directed in gait training in // 3x10' with rest breaks post each 10' distance, min A grossly VC for increased stride length, trunk extension, improved head carriage, and step width. [facility] [place] improving to min A with VC for technique. Pt then directed in gait training with FWW for 15' mod A. Pt requested to lay down at end of session and [facility] Pt directed in stand pivot transfer with FWW to bedside at mod A with single step cues and extra time to complete. Pt directed in sit>supine min A for BLE management and extra time for positioning in bed. Pt left in supine, alarm set, All needs in reach and in good condition. Call light in hand.  Pt denied pain at start and end of session. Pt very motivated and pleased with today's session and progress.   Therapy Documentation Precautions:  Precautions Precautions: Fall Precaution Comments: monitor O2, 2L continuous Restrictions Weight Bearing Restrictions: No     Pain: Pain Assessment Pain Scale: 0-10 Pain  Score: 4  Pain Type: Chronic pain Pain Location: Back Pain Descriptors / Indicators: Sore Pain Frequency: Intermittent Pain Onset: On-going Patients Stated Pain Goal: 2 Pain Intervention(s): Medication (See eMAR)    Therapy/Group: Individual Therapy  HJunie Panning10/18/2021, 2:44 PM

## 2020-08-03 NOTE — Progress Notes (Signed)
Speech Language Pathology Daily Session Note  Patient Details  Name: Stacie [name] MRN: [id number] Date of Birth: 11-10-1955  Today's Date: 08/03/2020 SLP Individual Time: 8472-0721 SLP Individual Time Calculation (min): 40 min  Short Term Goals: Week 1: SLP Short Term Goal 1 (Week 1): Patient will perform complex level problem solving tasks (medication management, financial management, etc) with minA. SLP Short Term Goal 2 (Week 1): Patient will utilize learned strategies for short term memory/recall and for conserving energy to complete complex level cognitive tasks with minA SLP Short Term Goal 3 (Week 1): Patient will demonstrate adequate organization of thoughts during conversation as well as during completion of functional, multi-step tasks with minA SLP Short Term Goal 4 (Week 1): Patient will demonstrate improved emergent and anticipatory awareness by formulating logical solutions to compensate for deficits during ADL tasks and during planning for future discharge home, with min A  Skilled Therapeutic Interventions: Pt was seen for skilled ST targeting cognitive goals. She demonstrated excellent overall improvements in cognitive function today. She was able to speak to the strategies she is using in PT/OT to maximize her functioning and also reporting feeling at cognitive baseline now. She completed a complex deductive reasoning puzzle and an additional scheduling task Mod I for problem solving. She was also Mod I for detecting and correcting 1 error throughout tasks, and Mod I for alternating her attention between functional conversation and tasks. Pt left laying in bed with alarm set and needs within reach. Continue per current plan of care.          Pain Pain Assessment Pain Scale: Faces Faces Pain Scale: No hurt   Therapy/Group: Individual Therapy  [name] 08/03/2020, 2:59 PM

## 2020-08-03 NOTE — Progress Notes (Addendum)
[place] PHYSICAL MEDICINE & REHABILITATION PROGRESS NOTE   Subjective/Complaints: Up in chair. No new complaints today. Denied pain  ROS: Patient denies fever, rash, sore throat, blurred vision, nausea, vomiting, diarrhea, cough, shortness of breath or chest pain,  headache, or mood change.   Objective:   No results found. Recent Labs    08/03/20 0635  WBC 6.0  HGB 10.1*  HCT 32.2*  PLT 130*   No results for input(s): NA, K, CL, CO2, GLUCOSE, BUN, CREATININE, CALCIUM in the last 72 hours.  Intake/Output Summary (Last 24 hours) at 08/03/2020 1330 Last data filed at 08/03/2020 1300 Gross per 24 hour  Intake 716 ml  Output 2900 ml  Net -2184 ml        Physical Exam: Vital Signs Blood pressure (!) 108/57, pulse (!) 108, temperature 98.2 F (36.8 C), resp. rate 16, height _0  (1.727 m), weight 77.3 kg, SpO2 99 %.   Constitutional: No distress . Vital signs reviewed. HEENT: EOMI, oral membranes moist Neck: supple Cardiovascular: RRR without murmur. No JVD    Respiratory/Chest: CTA Bilaterally without wheezes or rales. Normal effort    GI/Abdomen: BS +, non-tender, non-distended Ext: no clubbing, cyanosis, or edema Psych:a little flat Skin:cyanosis Right distal great toe, Left 2nd, 3rd toes and MTP area not progressing --no changes today        Ext bilateral DP pulses normal  10/17    Neurologic: Cranial nerves II through XII intact, motor strength is 5/5 in bilateral deltoid, bicep, tricep, grip,  2- to 2 Bilat HF, 3- B KE, ADF/PF   Musculoskeletal:pain free active assisted  range of motion in all 4 extremities. No joint swelling   Assessment/Plan: 1. Functional deficits secondary to debility post urosepsis which require 3+ hours per day of interdisciplinary therapy in a comprehensive inpatient rehab setting.  Physiatrist is providing close team supervision and 24 hour management of active medical problems listed below.  Physiatrist and rehab team  continue to assess barriers to discharge/monitor patient progress toward functional and medical goals  Care Tool:  Bathing    Body parts bathed by patient: Right arm, Left arm, Chest, Abdomen, Front perineal area, Right upper leg, Left upper leg   Body parts bathed by helper: Buttocks, Right lower leg, Left lower leg     Bathing assist Assist Level: Moderate Assistance - Patient 50 - 74% (using Stedy sit<stand)     Upper Body Dressing/Undressing Upper body dressing   What is the patient wearing?: Pull over shirt    Upper body assist Assist Level: Supervision/Verbal cueing    Lower Body Dressing/Undressing Lower body dressing      What is the patient wearing?: Underwear/pull up, Pants     Lower body assist Assist for lower body dressing: Maximal Assistance - Patient 25 - 49% (using Stedy sit<stand)     Toileting Toileting Toileting Activity did not occur (Clothing management and hygiene only): N/A (no void or bm)  Toileting assist Assist for toileting: Dependent - Patient 0% (foley)     Transfers Chair/bed transfer  Transfers assist     Chair/bed transfer assist level: Dependent - mechanical lift (Stedy)     Locomotion Ambulation   Ambulation assist   Ambulation activity did not occur: Safety/medical concerns          Walk 10 feet activity   Assist  Walk 10 feet activity did not occur: Safety/medical concerns        Walk 50 feet activity   Assist Walk 50  feet with 2 turns activity did not occur: Safety/medical concerns         Walk 150 feet activity   Assist Walk 150 feet activity did not occur: Safety/medical concerns         Walk 10 feet on uneven surface  activity   Assist Walk 10 feet on uneven surfaces activity did not occur: Safety/medical concerns         Wheelchair     Assist Will patient use wheelchair at discharge?: Yes (Per PT long term goals ) Type of Wheelchair: Manual Wheelchair activity did not occur:  Safety/medical concerns  Wheelchair assist level: Minimal Assistance - Patient > 75% Max wheelchair distance: 120    Wheelchair 50 feet with 2 turns activity    Assist    Wheelchair 50 feet with 2 turns activity did not occur: Safety/medical concerns   Assist Level: Minimal Assistance - Patient > 75%   Wheelchair 150 feet activity     Assist  Wheelchair 150 feet activity did not occur: Safety/medical concerns   Assist Level: Minimal Assistance - Patient > 75%   Blood pressure (!) 108/57, pulse (!) 108, temperature 98.2 F (36.8 C), resp. rate 16, height _0  (1.727 m), weight 77.3 kg, SpO2 99 %.  Medical Problem List and Plan: 1.  Altered mental status with decreased functional mobility secondary to sepsis/encephalopathy/embolic cerebral infarcts              -patient may shower             -ELOS/Goals: minA 20-24 days   2.  Antithrombotics: -DVT/anticoagulation: Subcutaneous heparin             -antiplatelet therapy: Aspirin 81 mg daily 3. Pain Management: Tylenol as needed. Well controlled.  4. Mood: Lexapro 20 mg daily             -antipsychotic agents: N/A 5. Neuropsych: This patient is capable of making decisions on her own behalf. 6. Skin/Wound Care: Routine skin checks Peripheral vasc disease vs small veseel embolic infarcts related to sepsis Cyanotic left toes #1,2,3, and R #1, plan for VVS is to monitor  Demarcation and f/u as OP   10/18 post op sandal, continue local care 7. Fluids/Electrolytes/Nutrition: Routine in and outs with follow-up chemistries 8.  Pulmonary sarcoidosis.  Patient on 2 L nasal cannula baseline.  Follow-up outpatient 9.  Hypertension.  Monitor with increased mobility. Well controlled.  Vitals:   08/03/20 0925 08/03/20 1256  BP:  (!) 108/57  Pulse:  (!) 108  Resp:  16  Temp:  98.2 F (36.8 C)  SpO2: 99% 99%  controlled 10/18 10.  Asthma.  Continue inhalers as directed 11.  Hyperlipidemia.  Lipitor 12.  Hypothyroidism.   Synthroid 13.  AKI.  Follow-up chemistries 14.  Bilateral lower extremity ischemia.  No urgent indication for lower extremity angiogram at present.  Follow-up vascular surgery. Good pulses B dorsalis pedis,likely small vessel occlusions 15.  Urinary retention. Should be able to  DC foley and initiate voiding trial- would like mobility to improve to allow toileting at least on [facility], pt does not recall difficulty with I/O caths  -continue foley today. Remains dependent for transfers.  Primary team may  address further tomorrow  16.  Leukocytosis associated with bacteremia will cont to monitor trending downward 17.  Constipation- had bm 10/17   LOS: 6 days A FACE TO [place] 08/03/2020, 1:30 PM

## 2020-08-03 NOTE — Progress Notes (Signed)
Occupational Therapy Session Note  Patient Details  Name: Stacie [name] MRN: [id number] Date of Birth: 1956/04/04  Today's Date: 08/03/2020 OT Individual Time: 1130-1202 OT Individual Time Calculation (min): 32 min    Short Term Goals: Week 1:  OT Short Term Goal 1 (Week 1): Pt will participate in seated ADL activity for 10 mins without fatigue or drop in O2 OT Short Term Goal 2 (Week 1): Pt will perform UB ADLs at EOB/sink with CGA OT Short Term Goal 3 (Week 1): Pt will perform LB dress with AE PRN with Mod A OT Short Term Goal 4 (Week 1): Pt will perform toilet transfer with Max of 1  Skilled Therapeutic Interventions/Progress Updates:    Patient in bed, alert, notes mild pain in lower back - level "4-5"  Nursing aware and provided pain medication during session.  Completed bed level low back stretch, rolling, pelvic tilt activities and activation of core muscles with good tolerance, side lying to sitting edge of bed with min/mod A.  CS for seated activities without support - tolerated for 15 minutes with focus on lateral leans, reach, trunk mobility, posture, abdominal engagement with good tolerance.  Sit to stand from elevated bed surface with min/mod A, with second standing attempt able to pivot to w/c with min/mod A of one with stand by of second person.  She remained seated in w/c at close of session with call bell and tray table in reach.    Therapy Documentation Precautions:  Precautions Precautions: Fall Precaution Comments: monitor O2, 2L continuous Restrictions Weight Bearing Restrictions: No   Therapy/Group: Individual Therapy  Carlos Levering 08/03/2020, 7:38 AM

## 2020-08-03 NOTE — Progress Notes (Signed)
Occupational Therapy Session Note  Patient Details  Name: Stacie [name] MRN: [id number] Date of Birth: 09-25-1956  Today's Date: 08/03/2020 OT Individual Time: 2202-5427 OT Individual Time Calculation (min): 57 min   Short Term Goals: Week 1:  OT Short Term Goal 1 (Week 1): Pt will participate in seated ADL activity for 10 mins without fatigue or drop in O2 OT Short Term Goal 2 (Week 1): Pt will perform UB ADLs at EOB/sink with CGA OT Short Term Goal 3 (Week 1): Pt will perform LB dress with AE PRN with Mod A OT Short Term Goal 4 (Week 1): Pt will perform toilet transfer with Max of 1  Skilled Therapeutic Interventions/Progress Updates:    Pt greeted in bed with no c/o pain. Amenable to engage in bathing/dressing tasks. 02 sats on 3L 100% at rest. Supine<sit completed with setup assist, HOB elevated and using bedrail. While EOB pt engaged in UB self care with min cuing for 02 mgt. Pt able to use the Rt UE functionally without additional assistance. Sitting balance with supervision and no LOBs durings session. 02 periodically assessed, with 1 decrease to 85%, rebounding to 96% in <10 seconds. All other times 02 was assessed, the readings were between 96-100%, even post stands. Max A for carefully washing LEs without touching gangrenous toes. Donned her [employer]co shoes with Total A. Mod A for sit<stand in Stedy from elevated bed afterwards. Note that pt did not rely on the knee block of Stedy for balance maintenance while completing perihygiene in the front or when elevating pants over hips. She needed assistance to thread pants due to catheter and decreased Lt LE mobility. Mod A for sit<stand in Stedy to transfer into the w/c. Pt then engaged in oral care and hair brushing while seated at the sink, once again using the Rt UE functionally, at dominant level when using hair brush. Pt remained sitting in the w/c at end of session, satting at 98% on 3L still. Left her with all needs with safety belt  fastened.    Therapy Documentation Precautions:  Precautions Precautions: Fall Precaution Comments: monitor O2, 2L continuous Restrictions Weight Bearing Restrictions: No Vital Signs: Oxygen Therapy SpO2: 99 % O2 Device: Nasal Cannula O2 Flow Rate (L/min): 2 L/min Pain: Pain Assessment Pain Scale: 0-10 Pain Score: 4  Pain Type: Chronic pain Pain Location: Back Pain Descriptors / Indicators: Sore Pain Frequency: Intermittent Pain Onset: On-going Patients Stated Pain Goal: 2 Pain Intervention(s): Medication (See eMAR) ADL: ADL Upper Body Bathing: Moderate assistance Where Assessed-Upper Body Bathing: Edge of bed Lower Body Bathing: Maximal assistance Where Assessed-Lower Body Bathing: Edge of bed Upper Body Dressing: Moderate assistance Where Assessed-Upper Body Dressing: Edge of bed Lower Body Dressing: Dependent Where Assessed-Lower Body Dressing: Bed level ADL Comments: bed level ADLs at eval d/t decreased endurance/tolerance      Therapy/Group: Individual Therapy  Kita Neace A Knox Cervi 08/03/2020, 12:07 PM

## 2020-08-04 ENCOUNTER — Inpatient Hospital Stay (HOSPITAL_COMMUNITY): Payer: BC Managed Care – PPO | Admitting: Occupational Therapy

## 2020-08-04 ENCOUNTER — Inpatient Hospital Stay (HOSPITAL_COMMUNITY): Payer: BC Managed Care – PPO | Admitting: Physical Therapy

## 2020-08-04 ENCOUNTER — Inpatient Hospital Stay (HOSPITAL_COMMUNITY): Payer: BC Managed Care – PPO | Admitting: Speech Pathology

## 2020-08-04 LAB — GLUCOSE, CAPILLARY
Glucose-Capillary: 114 mg/dL — ABNORMAL HIGH (ref 70–99)
Glucose-Capillary: 152 mg/dL — ABNORMAL HIGH (ref 70–99)
Glucose-Capillary: 172 mg/dL — ABNORMAL HIGH (ref 70–99)
Glucose-Capillary: 160 mg/dL — ABNORMAL HIGH (ref 70–99)

## 2020-08-04 NOTE — Progress Notes (Signed)
[place] PHYSICAL MEDICINE & REHABILITATION PROGRESS NOTE   Subjective/Complaints:   Had a great day in therapy yesterday   ROS: Patient denies CP, SOB N/V/D  Objective:   No results found. Recent Labs    08/03/20 0635  WBC 6.0  HGB 10.1*  HCT 32.2*  PLT 130*   No results for input(s): NA, K, CL, CO2, GLUCOSE, BUN, CREATININE, CALCIUM in the last 72 hours.  Intake/Output Summary (Last 24 hours) at 08/04/2020 0752 Last data filed at 08/04/2020 0606 Gross per 24 hour  Intake 480 ml  Output 2400 ml  Net -1920 ml        Physical Exam: Vital Signs Blood pressure (!) 116/57, pulse 92, temperature 98.6 F (37 C), resp. rate 17, height _0  (1.727 m), weight 79.8 kg, SpO2 98 %.   General: No acute distress Mood and affect are appropriate Heart: Regular rate and rhythm no rubs murmurs or extra sounds Lungs: Clear to auscultation, breathing unlabored, no rales or wheezes Abdomen: Positive bowel sounds, soft nontender to palpation, nondistended Extremities: see below  Skin:cyanosis Right distal great toe, Left 2nd, 3rd toes and MTP area not progressing --no changes today        Ext bilateral DP pulses normal  10/17    Neurologic: Cranial nerves II through XII intact, motor strength is 5/5 in bilateral deltoid, bicep, tricep, grip,  2- to 2 Bilat HF, 3- B KE, ADF/PF   Musculoskeletal:pain free active assisted  range of motion in all 4 extremities. No joint swelling   Assessment/Plan: 1. Functional deficits secondary to debility post urosepsis which require 3+ hours per day of interdisciplinary therapy in a comprehensive inpatient rehab setting.  Physiatrist is providing close team supervision and 24 hour management of active medical problems listed below.  Physiatrist and rehab team continue to assess barriers to discharge/monitor patient progress toward functional and medical goals  Care Tool:  Bathing    Body parts bathed by patient: Right arm,  Left arm, Chest, Abdomen, Front perineal area, Right upper leg, Left upper leg   Body parts bathed by helper: Buttocks, Right lower leg, Left lower leg     Bathing assist Assist Level: Moderate Assistance - Patient 50 - 74% (using Stedy sit<stand)     Upper Body Dressing/Undressing Upper body dressing   What is the patient wearing?: Pull over shirt    Upper body assist Assist Level: Supervision/Verbal cueing    Lower Body Dressing/Undressing Lower body dressing      What is the patient wearing?: Underwear/pull up, Pants     Lower body assist Assist for lower body dressing: Maximal Assistance - Patient 25 - 49% (using Stedy sit<stand)     Toileting Toileting Toileting Activity did not occur Landscape architect and hygiene only): N/A (no void or bm)  Toileting assist Assist for toileting: Dependent - Patient 0% (foley)     Transfers Chair/bed transfer  Transfers assist     Chair/bed transfer assist level: Dependent - mechanical lift (Stedy)     Locomotion Ambulation   Ambulation assist   Ambulation activity did not occur: Safety/medical concerns          Walk 10 feet activity   Assist  Walk 10 feet activity did not occur: Safety/medical concerns        Walk 50 feet activity   Assist Walk 50 feet with 2 turns activity did not occur: Safety/medical concerns         Walk 150 feet activity   Assist  Walk 150 feet activity did not occur: Safety/medical concerns         Walk 10 feet on uneven surface  activity   Assist Walk 10 feet on uneven surfaces activity did not occur: Safety/medical concerns         Wheelchair     Assist Will patient use wheelchair at discharge?: Yes (Per PT long term goals ) Type of Wheelchair: Manual Wheelchair activity did not occur: Safety/medical concerns  Wheelchair assist level: Minimal Assistance - Patient > 75% Max wheelchair distance: 120    Wheelchair 50 feet with 2 turns  activity    Assist    Wheelchair 50 feet with 2 turns activity did not occur: Safety/medical concerns   Assist Level: Minimal Assistance - Patient > 75%   Wheelchair 150 feet activity     Assist  Wheelchair 150 feet activity did not occur: Safety/medical concerns   Assist Level: Minimal Assistance - Patient > 75%   Blood pressure (!) 116/57, pulse 92, temperature 98.6 F (37 C), resp. rate 17, height 5' 8" (1.727 m), weight 79.8 kg, SpO2 98 %.  Medical Problem List and Plan: 1.  Altered mental status with decreased functional mobility secondary to sepsis/encephalopathy/embolic cerebral infarcts              -patient may shower             -ELOS/Goals: minA 20-24 days Team conf in am  CIR PT, OT, SLP  2.  Antithrombotics: -DVT/anticoagulation: Subcutaneous heparin             -antiplatelet therapy: Aspirin 81 mg daily 3. Pain Management: Tylenol as needed. Well controlled.  4. Mood: Lexapro 20 mg daily             -antipsychotic agents: N/A 5. Neuropsych: This patient is capable of making decisions on her own behalf. 6. Skin/Wound Care: Routine skin checks Peripheral vasc disease vs small veseel embolic infarcts related to sepsis Cyanotic left toes #1,2,3, and R #1, plan for VVS is to monitor  Demarcation and f/u as OP   10/18 post op sandal, continue local care 7. Fluids/Electrolytes/Nutrition: Routine in and outs with follow-up chemistries 8.  Pulmonary sarcoidosis.  Patient on 2 L nasal cannula baseline.  Follow-up outpatient 9.  Hypertension.  Monitor with increased mobility. Well controlled.  Vitals:   08/03/20 1926 08/04/20 0411  BP: 110/66 (!) 116/57  Pulse: 97 92  Resp: 19 17  Temp: 97.8 F (36.6 C) 98.6 F (37 C)  SpO2: 99% 98%  controlled 10/19 10.  Asthma.  Continue inhalers as directed 11.  Hyperlipidemia.  Lipitor 12.  Hypothyroidism.  Synthroid 13.  AKI.  Follow-up chemistries 14.  Bilateral lower extremity ischemia.  No urgent indication for  lower extremity angiogram at present.  Follow-up vascular surgery. Good pulses B dorsalis pedis,likely small vessel occlusions 15.  Urinary retention. Should be able to  DC foley and initiate voiding trial- would like mobility to improve to allow toileting at least on [facility], pt does not recall difficulty with I/O caths  Will discuss with team in am , feasibility of BSC transfers vs bed pan for bladder retraining   16.  Leukocytosis associated with bacteremia - resolved  17.  Constipation- had bm 10/17   LOS: 7 days A FACE TO [place] E Gedalia Mcmillon 08/04/2020, 7:52 AM

## 2020-08-04 NOTE — Progress Notes (Signed)
Occupational Therapy Session Note  Patient Details  Name: Stacie [name] MRN: [id number] Date of Birth: 03-Jun-1956  Today's Date: 08/04/2020 OT Individual Time: 0915-1000   &   1300-1345 OT Individual Time Calculation (min): 45 min    &   45 min   Short Term Goals: Week 1:  OT Short Term Goal 1 (Week 1): Pt will participate in seated ADL activity for 10 mins without fatigue or drop in O2 OT Short Term Goal 2 (Week 1): Pt will perform UB ADLs at EOB/sink with CGA OT Short Term Goal 3 (Week 1): Pt will perform LB dress with AE PRN with Mod A OT Short Term Goal 4 (Week 1): Pt will perform toilet transfer with Max of 1  Skilled Therapeutic Interventions/Progress Updates:    AM session:   Patient in bed, alert and ready for therapy session   She denies pain.  On 2L O2 via [place] t/o session.   Completed LB bathing and dressing bed level - min A for bathing for thoroughness to wash lower legs and buttocks.  Max A to donn pants and incontinence brief, mod A over hips, dependent for foot wear.  Supine to sitting edge of bed with min A.  Sit to stand and SPT with RW bed to w/c with min A.  Completed hair washing with max A, oral care supervision, UB bathing and dressing with set up.  She remained seated in w/c at close of session with call bell and tray table in reach.     PM session:   Patient seated in w/c - she denies pain but states that she is tired.  On 2L O2 via [place] t/o session.  Completed UB ergometer 2 x 2 minutes with rest break and slow rate (O2 sat 99%, HR 108 at completion)  Completed towel slides and shoulder/back stretch on elevated mat table, UB general conditioning, core mobility and stability activities w/c level - significant fatigue noted, reviewed energy conservation techniques.  SPT back to bed with min A.  Sit to supine min/mod A.  She remained in bed at close of session, bed alarm set and call bell in reach.     Therapy Documentation Precautions:  Precautions Precautions:  Fall Precaution Comments: monitor O2, 2L continuous Restrictions Weight Bearing Restrictions: No   Therapy/Group: Individual Therapy  Carlos Levering 08/04/2020, 7:33 AM

## 2020-08-04 NOTE — Progress Notes (Signed)
Physical Therapy Session Note  Patient Details  Name: Stacie [name] MRN: [id number] Date of Birth: 1956-02-02  Today's Date: 08/04/2020 PT Individual Time: 1105-1200 PT Individual Time Calculation (min): 55 min   Short Term Goals: Week 1:  PT Short Term Goal 1 (Week 1): Pt will transfer to [facility] with mod assist of 1 PT Short Term Goal 2 (Week 1): Pt will propell WC 11f with supervision assist PT Short Term Goal 3 (Week 1): Pt will initiate gait training PT Short Term Goal 4 (Week 1): Pt will tolerate sitting in 1 hour between therapist  Skilled Therapeutic Interventions/Progress Updates: Pt presented in w/c agreeable to therapy. Pt denies pain at start of session. Pt transported to rehab gym for energy conservation to parallel bars. Pt participated in STS in parallel bars with mod fading to minA. In standing pt participated in static stand ~2 min then pre-gait with step forward/backwards. Pt then ambulated x 561fforwards backwards in parallel bars with minA for achieve standing but CGA for ambulation. After seated rest pt ambulated an additional [id number] RW and w/c follow. Pt required modA for STS from w/c but was CGA with slight L knee instability during gait. Pt then transported over to mat and performed stand pivot transfer with modA for STS and CGA for pivot. Pt participated in ball toss in standing with following STS requiring pt to count during transition to avoid holding breath during transitional movement. Pt was able to maintain standing with CGA x 15 ball tosses before requiring seated rest. Vitals checked intermittently throughout session with SpO2 >95% with activity and max HR 115 after ambulation. Pt did receive frequent seated rest breaks throughout session for recovery after activities. Pt transported back to room at end of session and requesting to remain in w/c to eat lunch. Pt left with call bell within reach, and current needs met.      Therapy Documentation Precautions:   Precautions Precautions: Fall Precaution Comments: monitor O2, 2L continuous Restrictions Weight Bearing Restrictions: No General:   Vital Signs: Therapy Vitals Temp: 98.5 F (36.9 C) Pulse Rate: 98 Resp: 17 BP: 102/60 Patient Position (if appropriate): Lying Oxygen Therapy SpO2: 99 % O2 Device: Nasal Cannula Pain: Pain Assessment Pain Scale: 0-10 Pain Score: 0-No pain Mobility:   Locomotion :    Trunk/Postural Assessment :    Balance:   Exercises:   Other Treatments:      Therapy/Group: Individual Therapy  Khilee Hendricksen 08/04/2020, 3:06 PM

## 2020-08-04 NOTE — Progress Notes (Signed)
Speech Language Pathology Discharge Summary  Patient Details  Name: Stacie [name] MRN: [id number] Date of Birth: 1955/12/16  Today's Date: 08/04/2020 SLP Individual Time: 5248-1859 SLP Individual Time Calculation (min): 40 min   Skilled Therapeutic Interventions:  Pt was seen for skilled ST targeting cognitive goals. SLP administered MOCA version 7.1 to reassess cognitive functioning, given quick progress in ST and pt's reports she is back to cognitive baseline. Pt scored 24/30 (WNL=26 or >). She reported she has always had mild immediate/short term recall deficits (which is one area of impairment per [facility]). Very mild delay in processing noted to impact performance on a selective attention task. She was Mod I for basic and complex problem solving throughout formal and informal assessment. She also anticipated situations in which she would need assistance + equipment + impact of current status on daily functioning and recalled strategies for energy conservation Mod I. Given that pt was WNL on assessment, at Mod I goal level for complex cognitive skills, and believed to be at baseline, no further ST is indicated. Pt is in agreement and wishes to focus more on PT/OT for the remainder of her inpatient stay. Pt left laying in bed with alarm set and needs within reach.    Patient has met 3 of 3 long term goals.  Patient to discharge at overall Modified Independent level.  Reasons goals not met: n/a   Clinical Impression/Discharge Summary:   Pt made quick functional gains and met 3 out of 3 long term goals this admission. Pt is currently Mod I for complex cognitive skills and reports this is her baseline level of functioning. Pt has demonstrated improved recall and carryover of cognitive-linguistic strategies to maximize her rehab outcomes, complex problem solving, higher level attention to tasks, self-monitoring, and anticipatory awareness. Pt would benefit from intermittent supervision from a cognitive  standpoint when discharged home, however PT/OT may recommend 24/7 supervision depending on safety with mobility. No skilled ST services are indicated upon discharge. Pt education is complete at this time.    Care Partner:  Caregiver Able to Provide Assistance: Yes  Type of Caregiver Assistance: Cognitive  Recommendation:  None  Rationale for SLP Follow Up:  (no follow up ST indicated)   Equipment: none   Reasons for discharge: Treatment goals met   Patient/Family Agrees with Progress Made and Goals Achieved: Yes    [name] 08/04/2020, 2:56 PM

## 2020-08-05 ENCOUNTER — Inpatient Hospital Stay (HOSPITAL_COMMUNITY): Payer: BC Managed Care – PPO

## 2020-08-05 ENCOUNTER — Inpatient Hospital Stay (HOSPITAL_COMMUNITY): Payer: BC Managed Care – PPO | Admitting: Physical Therapy

## 2020-08-05 ENCOUNTER — Inpatient Hospital Stay (HOSPITAL_COMMUNITY): Payer: BC Managed Care – PPO | Admitting: Occupational Therapy

## 2020-08-05 LAB — GLUCOSE, CAPILLARY
Glucose-Capillary: 107 mg/dL — ABNORMAL HIGH (ref 70–99)
Glucose-Capillary: 131 mg/dL — ABNORMAL HIGH (ref 70–99)
Glucose-Capillary: 137 mg/dL — ABNORMAL HIGH (ref 70–99)
Glucose-Capillary: 168 mg/dL — ABNORMAL HIGH (ref 70–99)

## 2020-08-05 NOTE — Progress Notes (Signed)
Physical Therapy Session Note  Patient Details  Name: Stacie [name] MRN: [id number] Date of Birth: 06/28/1956  Today's Date: 08/05/2020 PT Individual Time: 1350-1430 PT Individual Time Calculation (min): 40 min   Short Term Goals: Week 1:  PT Short Term Goal 1 (Week 1): Pt will transfer to WC with mod assist of 1 PT Short Term Goal 2 (Week 1): Pt will propell WC 100ft with supervision assist PT Short Term Goal 3 (Week 1): Pt will initiate gait training PT Short Term Goal 4 (Week 1): Pt will tolerate sitting in 1 hour between therapist   Skilled Therapeutic Interventions/Progress Updates: Pt presented in w/c agreeable to therapy. Pt denies pain but indicated moderate fatigue from earlier sessions. Pt agreeable to perform w/c therex outside. Pt transported to [facility] entrance total A and participated in following therex for BLE strengthening. LAQ, hip flexion, hip abd/add 2 x 10 ea. Pt required increased time between bouts due to fatigue. Pt then propelled several bouts around patio requiring minA for inclines and supervision with frequent breaks on level surfaces. Pt propelled distances varying between 30-75 feet. Pt transported back to room and remained in w/c with call bell within reach and needs met.      Therapy Documentation Precautions:  Precautions Precautions: Fall Precaution Comments: monitor O2, 2L continuous Restrictions Weight Bearing Restrictions: No General:   Vital Signs: Therapy Vitals Temp: 98.6 F (37 C) Temp Source: Oral Pulse Rate: (!) 108 Resp: 20 BP: (!) 110/53 Patient Position (if appropriate): Sitting Oxygen Therapy SpO2: 99 % O2 Device: Nasal Cannula   Therapy/Group: Individual Therapy      , PTA  08/05/2020, 4:04 PM  

## 2020-08-05 NOTE — Progress Notes (Signed)
Physical Therapy Weekly Progress Note  Patient Details  Name: Stacie [name] MRN: [id number] Date of Birth: 23-May-1956  Beginning of progress report period: July 29, 2020 End of progress report period: August 05, 2020  Today's Date: 08/05/2020 PT Individual Time: 0905-1000 1550-1615 PT Individual Time Calculation (min): 55 min 25 min   Patient has met 4 of 4 short term goals.  Pt has made greater than expected progress towards LTGs of min assist overall ambulation. She is currently requiring min-mod assist tranfers with RW to power into standing, supervision assist WC mobility, and min assist ambulation up to 65f.    Patient continues to demonstrate the following deficits muscle weakness and muscle joint tightness, decreased cardiorespiratoy endurance and decreased standing balance and decreased balance strategies and therefore will continue to benefit from skilled PT intervention to increase functional independence with mobility.  Patient progressing toward long term goals..  Continue plan of care.  PT Short Term Goals Week 1:  PT Short Term Goal 1 (Week 1): [facility] PT Short Term Goal 1 - Progress (Week 1): Met PT Short Term Goal 2 (Week 1): Pt will propell WC 1012fwith supervision assist PT Short Term Goal 2 - Progress (Week 1): Met PT Short Term Goal 3 (Week 1): Pt will initiate gait training PT Short Term Goal 3 - Progress (Week 1): Met PT Short Term Goal 4 (Week 1): Pt will tolerate sitting in 1 hour between therapist PT Short Term Goal 4 - Progress (Week 1): Met Week 2:  PT Short Term Goal 1 (Week 2): [facility] PT Short Term Goal 2 (Week 2): Pt wil ambulate 7584fith RW and min assist PT Short Term Goal 3 (Week 2): Pt will ascend 4 steps with BUE support on mod assist  Skilled Therapeutic Interventions/Progress Updates:  Session 1  Pt received sitting in WC and agreeable to PT. Pt remained on  2L/min O2 via nasal canula throughout session. WC mobility throughout hall with supervision assist x 150f39f rehab gym.   Gait training with RW x 20ft50f 40ft 74fh min assist and WC follow for safety. PT applied DF wrap on the second bout to the LLE with improved step height and step length on the LLE. SpO2 assessed throughout with mild decrease to 86% on the first bout with questionable accuracy and >95% throughout on second bout.   Sit<>stand tranfers throughout session with mod assist from [facility] and min assist from elevated height of nustep. Cues for UE and LE placement to prevent posterior LOB.    Nustep cardiovascular training 5 min +3 min with prolonged rest break between bouts due to fatigue. SpO2 monitored throughout >95% with cues for pursed lip breathing intermittently.   Patient returned to room and left sitting in [facility] bell in reach and all needs met.    Session 2     Pt received sitting in WC and agreeable to PT. Pt requesting to bed. Stand pivot transfer to bed with min assist and increased time to push into standing. Supervision assist sit>supine with cues for safety and protection of the L foot.   Supine therex:  Bridges x 8  LAQ x 10 with AAROM on the L  SAQ x12 BLE  Hip abduction with manual resistance x 10  Isometric hip adduction x 10  Ankle DF/PF x 25 BLE  Cues for decreased speed eccentrically  as well as increased hold at end range throughout.  Pt left in bed with call bell in reach and all needs met.    Therapy Documentation Precautions:  Precautions Precautions: Fall Precaution Comments: monitor O2, 2L continuous Restrictions Weight Bearing Restrictions: No Vital Signs: Therapy Vitals Pulse Rate: 92 Resp: 16 Oxygen Therapy SpO2: 99 % O2 Device: Nasal Cannula O2 Flow Rate (L/min): 2 L/min Pain: Pain Assessment Pain Scale: 0-10 Pain Score: 0-No pain   Therapy/Group: Individual Therapy  Lorie Phenix 08/05/2020, 10:35 AM

## 2020-08-05 NOTE — Progress Notes (Signed)
[place] PHYSICAL MEDICINE & REHABILITATION PROGRESS NOTE   Subjective/Complaints:  Per RN, Left foot bulla ruptured , no pain  Per pt able to get on bedpan for BM  ROS: Patient denies CP, SOB N/V/D  Objective:   No results found. Recent Labs    08/03/20 0635  WBC 6.0  HGB 10.1*  HCT 32.2*  PLT 130*   No results for input(s): NA, K, CL, CO2, GLUCOSE, BUN, CREATININE, CALCIUM in the last 72 hours.  Intake/Output Summary (Last 24 hours) at 08/05/2020 0807 Last data filed at 08/05/2020 0500 Gross per 24 hour  Intake 620 ml  Output 2750 ml  Net -2130 ml        Physical Exam: Vital Signs Blood pressure (!) 129/58, pulse 87, temperature (!) 97.5 F (36.4 C), resp. rate 18, height 5' 8" (1.727 m), weight 78.5 kg, SpO2 100 %.   General: No acute distress Mood and affect are appropriate Heart: Regular rate and rhythm no rubs murmurs or extra sounds Lungs: Clear to auscultation, breathing unlabored, no rales or wheezes Abdomen: Positive bowel sounds, soft nontender to palpation, nondistended Extremities: see below  Skin:cyanosis Right distal great toe, Left 2nd, 3rd toes and MTP area not progressing --no changes today        10/20, rupture of bulla      Neurologic: Cranial nerves II through XII intact, motor strength is 5/5 in bilateral deltoid, bicep, tricep, grip,  2- to 2 Bilat HF, 3- B KE, ADF/PF   Musculoskeletal:pain free active assisted  range of motion in all 4 extremities. No joint swelling   Assessment/Plan: 1. Functional deficits secondary to debility post urosepsis which require 3+ hours per day of interdisciplinary therapy in a comprehensive inpatient rehab setting.  Physiatrist is providing close team supervision and 24 hour management of active medical problems listed below.  Physiatrist and rehab team continue to assess barriers to discharge/monitor patient progress toward functional and medical goals  Care Tool:  Bathing    Body  parts bathed by patient: Right arm, Left arm, Chest, Abdomen, Front perineal area, Right upper leg, Left upper leg, Buttocks, Right lower leg, Left lower leg, Face   Body parts bathed by helper: Buttocks, Right lower leg, Left lower leg     Bathing assist Assist Level: Minimal Assistance - Patient > 75%     Upper Body Dressing/Undressing Upper body dressing   What is the patient wearing?: Pull over shirt    Upper body assist Assist Level: Set up assist    Lower Body Dressing/Undressing Lower body dressing      What is the patient wearing?: Pants, Incontinence brief     Lower body assist Assist for lower body dressing: Maximal Assistance - Patient 25 - 49%     Toileting Toileting Toileting Activity did not occur (Clothing management and hygiene only): N/A (no void or bm)  Toileting assist Assist for toileting: Dependent - Patient 0% (foley)     Transfers Chair/bed transfer  Transfers assist     Chair/bed transfer assist level: Minimal Assistance - Patient > 75%     Locomotion Ambulation   Ambulation assist   Ambulation activity did not occur: Safety/medical concerns  Assist level: Minimal Assistance - Patient > 75% Assistive device: Walker-rolling Max distance: 59f   Walk 10 feet activity   Assist  Walk 10 feet activity did not occur: Safety/medical concerns  Assist level: Minimal Assistance - Patient > 75% Assistive device: Walker-rolling   Walk 50 feet activity  Assist Walk 50 feet with 2 turns activity did not occur: Safety/medical concerns         Walk 150 feet activity   Assist Walk 150 feet activity did not occur: Safety/medical concerns         Walk 10 feet on uneven surface  activity   Assist Walk 10 feet on uneven surfaces activity did not occur: Safety/medical concerns         Wheelchair     Assist Will patient use wheelchair at discharge?: Yes (Per PT long term goals ) Type of Wheelchair: Manual Wheelchair  activity did not occur: Safety/medical concerns  Wheelchair assist level: Minimal Assistance - Patient > 75% Max wheelchair distance: 120    Wheelchair 50 feet with 2 turns activity    Assist    Wheelchair 50 feet with 2 turns activity did not occur: Safety/medical concerns   Assist Level: Minimal Assistance - Patient > 75%   Wheelchair 150 feet activity     Assist  Wheelchair 150 feet activity did not occur: Safety/medical concerns   Assist Level: Minimal Assistance - Patient > 75%   Blood pressure (!) 129/58, pulse 87, temperature (!) 97.5 F (36.4 C), resp. rate 18, height _0  (1.727 m), weight 78.5 kg, SpO2 100 %.  Medical Problem List and Plan: 1.  Altered mental status with decreased functional mobility secondary to sepsis/encephalopathy/embolic cerebral infarcts              -patient may shower             -ELOS/Goals: minA 20-24 days Team conference today please see physician documentation under team conference tab, met with team  to discuss problems,progress, and goals. Formulized individual treatment plan based on medical history, underlying problem and comorbidities.  CIR PT, OT, SLP  2.  Antithrombotics: -DVT/anticoagulation: Subcutaneous heparin             -antiplatelet therapy: Aspirin 81 mg daily 3. Pain Management: Tylenol as needed. Well controlled.  4. Mood: Lexapro 20 mg daily             -antipsychotic agents: N/A 5. Neuropsych: This patient is capable of making decisions on her own behalf. 6. Skin/Wound Care: Routine skin checks, add xeroform telfa and kerlix Left foot  Peripheral vasc disease vs small veseel embolic infarcts related to sepsis Cyanotic left toes #1,2,3, and R #1, plan for VVS is to monitor  Demarcation and f/u as OP   10/18 post op sandal, continue local care 7. Fluids/Electrolytes/Nutrition: Routine in and outs with follow-up chemistries 8.  Pulmonary sarcoidosis.  Patient on 2 L nasal cannula baseline.  Follow-up  outpatient 9.  Hypertension.  Monitor with increased mobility. Well controlled.  Vitals:   08/05/20 0500 08/05/20 0517  BP: 130/66 (!) 129/58  Pulse: 88 87  Resp: 18   Temp: (!) 97.5 F (36.4 C)   SpO2: 97% 100%  controlled 10/20 10.  Asthma.  Continue inhalers as directed 11.  Hyperlipidemia.  Lipitor 12.  Hypothyroidism.  Synthroid 13.  AKI.  Follow-up chemistries 14.  Bilateral lower extremity ischemia.  No urgent indication for lower extremity angiogram at present.  Follow-up vascular surgery. Good pulses B dorsalis pedis,likely small vessel occlusions 15.  Urinary retention. Should be able to  DC foley and initiate voiding trial- would like mobility to improve to allow toileting at least on [facility], pt does not recall difficulty with I/O caths  Will discuss with team today  , feasibility of BSC transfers vs  bed pan for bladder retraining   16.  Leukocytosis associated with bacteremia - resolved  17.  Constipation- had bm 10/17   LOS: 8 days A FACE TO FACE EVALUATION WAS PERFORMED  Charlett Blake 08/05/2020, 8:07 AM

## 2020-08-05 NOTE — Progress Notes (Signed)
Occupational Therapy Session Note  Patient Details  Name: Stacie [name] MRN: [id number] Date of Birth: 02/14/1956  Today's Date: 08/05/2020 OT Individual Time: 1118-1200 OT Individual Time Calculation (min): 42 min    Short Term Goals: Week 1:  OT Short Term Goal 1 (Week 1): Pt will participate in seated ADL activity for 10 mins without fatigue or drop in O2 OT Short Term Goal 2 (Week 1): Pt will perform UB ADLs at EOB/sink with CGA OT Short Term Goal 3 (Week 1): Pt will perform LB dress with AE PRN with Mod A OT Short Term Goal 4 (Week 1): Pt will perform toilet transfer with Max of 1  Skilled Therapeutic Interventions/Progress Updates:  Patient met seated in wc in agreement with OT treatment session. 0/10 pain at rest and with activity. Patient on 2L O2 via [place] throughout tx session. Patient with request to complete UB bathing/dressing seated at sink level. Stand-pivot to BSC over standard toilet with use of RW. Patient with DOE with good awareness of pursed lip breathing technique. Patient able to complete hygiene seated on commode with increased time. Assistance to manage clothing in standing with and without unilateral UE support on RW. Session concluded with patient seated in wc with call bell within reach and all needs met.   Therapy Documentation Precautions:  Precautions Precautions: Fall Precaution Comments: monitor O2, 2L continuous Restrictions Weight Bearing Restrictions: No General:    Therapy/Group: Individual Therapy   R Howerton-Davis 08/05/2020, 12:16 PM 

## 2020-08-05 NOTE — Progress Notes (Signed)
Physical Therapy Session Note  Patient Details  Name: Stacie [name] MRN: [id number] Date of Birth: December 12, 1955  Today's Date: 08/05/2020 PT Individual Time: 0800-0830 PT Individual Time Calculation (min): 30 min   Short Term Goals: Week 1:  PT Short Term Goal 1 (Week 1): Pt will transfer to [facility] with mod assist of 1 PT Short Term Goal 1 - Progress (Week 1): Met PT Short Term Goal 2 (Week 1): Pt will propell WC 173f with supervision assist PT Short Term Goal 2 - Progress (Week 1): Met PT Short Term Goal 3 (Week 1): Pt will initiate gait training PT Short Term Goal 3 - Progress (Week 1): Met PT Short Term Goal 4 (Week 1): Pt will tolerate sitting in 1 hour between therapist PT Short Term Goal 4 - Progress (Week 1): Met Week 2:  PT Short Term Goal 1 (Week 2): [facility] PT Short Term Goal 2 (Week 2): Pt wil ambulate 758fwith RW and min assist PT Short Term Goal 3 (Week 2): Pt will ascend 4 steps with BUE support on mod assist Week 3:     Skilled Therapeutic Interventions/Progress Updates:    PAIN Pt seen this am for 30 min session w/focus on GENERAL mobility.  Pt on 2L02 via [place], transferred to portable tank. Pt initially supine, agreeable to gait training.  Therapist carfully dons socks and cast shoe.  Supine to sit w/rails and min assist.  MD arrived and needs shoes removed for inspection of wounds.  Pt returns supine w/cga, scoots in bed w/mod assist, shoes/socks removed by therapist.  MD inspected wounds and PT assisted nursing w/positioning limb for dressing change.   Socks/cast shoe again donned and supine to sit as above.  Pt w/dyspnea, instructed w/deep breathing technique. STS w/RW and mod assist from slightly elevated bed. Gait 2522f/RW and cga, second person to assist w/02, turn/sit to wc w/cga.  02 100%, HR 109 Pt transported backto room and left oob in wc w/needs in reach, 02 supply changed back to wall. Therapy Documentation  Precautions:  Precautions Precautions: Fall Precaution Comments: monitor O2, 2L continuous Restrictions Weight Bearing Restrictions: No    Therapy/Group: Individual Therapy  BarCallie FieldingT [place]/20/2021, 12:36 PM

## 2020-08-05 NOTE — Progress Notes (Signed)
Patient ID: Stacie [name], female   DOB: 1956-04-21, 64 y.o.   MRN: [id number] Team Conference Report to Patient/Family  Team Conference discussion was reviewed with the patient and caregiver, including goals, any changes in plan of care and target discharge date.  Patient and caregiver express understanding and are in agreement.  The patient has a target discharge date of 08/19/20.  Stacie [name] 08/05/2020, 1:44 PM

## 2020-08-05 NOTE — Patient Care Conference (Signed)
Inpatient RehabilitationTeam Conference and Plan of Care Update Date: 08/05/2020   Time: 10:14 AM    Patient Name: [place] Record Number: [id number]  Date of Birth: 1955-10-21 Sex: Female         Room/Bed: 4W21C/4W21C-01 Payor Info: Payor: [place] / Plan: BCBS COMM PPO / Product Type: *No Product type* /    Admit Date/Time:  07/28/2020  3:13 PM  Primary Diagnosis:  Cerebrovascular accident (CVA) due to embolism of cerebral artery [facility])  Hospital Problems: Principal Problem:   Cerebrovascular accident (CVA) due to embolism of cerebral artery [facility]) Active Problems:   Sepsis [facility])    Expected Discharge Date: Expected Discharge Date: 08/19/20  Team Members Present: Physician leading conference: Dr. Alysia Penna Care Coodinator Present: Dorien Chihuahua, RN, BSN, CRRN;Christina Sampson Goon, [place] Nurse Present: Suella Grove, RN PT Present: Barrie Folk, PT OT Present: Turner Daniels, OT SLP Present: Jettie Booze, CF-SLP PPS Coordinator present : Ileana Ladd, Burna Mortimer, SLP     Current Status/Progress Goal Weekly Team Focus  Bowel/Bladder             Swallow/Nutrition/ Hydration             ADL's   Mod A bathing EOB sit<stand using [place], Supervision UB dressing EOB, Max-Total A LB dressing EOB sit<stand in [place], 1 assist Stedy transfers, not yet completed toilet transfer per [employer] transfers, Min ADLs  Activity tolerance, sitting/standing balance, functional transfers, NMR, ADL retraining   Mobility   minA bed mobility, modA STS with RW from lower surface, minA elevated surface, minA gait with RW up to 3f  minA - may be upgraded  BLE strengthening, transfers, gait, endurance, d/c planning   Communication             Safety/Cognition/ Behavioral Observations  Mod I  Mod I  goals met - d/c from ST 10/19   Pain             Skin               Discharge Planning:  Goal to d/c home W/ DTR and  spouse, used 2L o2 previously, set up on 1st floor with bed and bath   Team Discussion: Blister on dorsum left foot opened; dressing modified for area. WBC WNL. Dry gangrene on toes of left and right foot with left foot drop. . Chronic oxygen for sarcoidosis. Discussed foley removal and toileting assistance/ability.    Patient on target to meet rehab goals: yes, upgraded goals due to progress noted.  *See Care Plan and progress notes for long and short-term goals.   Revisions to Treatment Plan:  Discontinued SLP on 08/04/20 Upgraded ambulation goals to household level  Teaching Needs: Transfers, toileting, medications, wound care, etc.   Current Barriers to Discharge: Decreased caregiver support and Home enviroment access/layout  Possible Resolutions to Barriers: Family education     Medical Summary Current Status: Left foot blister rupture, change in dressing, lower extremities weakness persisting  Barriers to Discharge: Decreased family/caregiver support  Barriers to Discharge Comments: Chronic debility with sarcoidosis O2 dependent Possible Resolutions to BCelanese CorporationFocus: Continue rehab program, discussed dressing changes with nursing   Continued Need for Acute Rehabilitation Level of Care: The patient requires daily medical management by a physician with specialized training in physical medicine and rehabilitation for the following reasons: Direction of a multidisciplinary physical rehabilitation program to maximize functional independence : Yes Medical management of patient stability  for increased activity during participation in an intensive rehabilitation regime.: Yes Analysis of laboratory values and/or radiology reports with any subsequent need for medication adjustment and/or medical intervention. : Yes   I attest that I was present, lead the team conference, and concur with the assessment and plan of the team.   Dorien Chihuahua B 08/05/2020, 3:20 PM

## 2020-08-06 ENCOUNTER — Inpatient Hospital Stay (HOSPITAL_COMMUNITY): Payer: BC Managed Care – PPO | Admitting: Occupational Therapy

## 2020-08-06 ENCOUNTER — Inpatient Hospital Stay (HOSPITAL_COMMUNITY): Payer: BC Managed Care – PPO | Admitting: Physical Therapy

## 2020-08-06 LAB — GLUCOSE, CAPILLARY
Glucose-Capillary: 95 mg/dL (ref 70–99)
Glucose-Capillary: 132 mg/dL — ABNORMAL HIGH (ref 70–99)
Glucose-Capillary: 138 mg/dL — ABNORMAL HIGH (ref 70–99)
Glucose-Capillary: 148 mg/dL — ABNORMAL HIGH (ref 70–99)

## 2020-08-06 NOTE — Progress Notes (Signed)
Occupational Therapy Weekly Progress Note  Patient Details  Name: Stacie [name] MRN: [id number] Date of Birth: 02/03/1956  Beginning of progress report period: July 29, 2020 End of progress report period: August 06, 2020  Today's Date: 08/06/2020 OT Individual Time: 0845-1000 OT Individual Time Calculation (min): 75 min    Patient has met 4 of 4 short term goals. Patient currently demonstrates increased activity tolerance for OOB ADL tasks utilizing energy conservation techniques and rest breaks as needed. Upon evaluation, patient was only tolerating bathing/dressing tasks at bed level but has since progressed to bathing/dressing in sitting/standing at sink level. Seated in wc at sink, patient able to bathe/dress UB with set-up assist. LB bathing with Min A in sitting/standing and LB dressing with Mod A to thread BLE. Patient able to hike LB clothing over hips in standing with and without unilateral UE support on sink surface. Upon initial evaluation patient was utilizing Stedy for toilet transfers. Patient currently completes toilet transfers with Mod A progressing to Min A.   Patient continues to demonstrate the following deficits: muscle weakness, decreased cardiorespiratoy endurance, decreased standing balance, and decreased balance strategies  and therefore will continue to benefit from skilled OT intervention to enhance overall performance with BADL, iADL and Reduce care partner burden.  Patient progressing toward long term goals..  Continue plan of care.  OT Short Term Goals Week 1:  OT Short Term Goal 1 (Week 1): Pt will participate in seated ADL activity for 10 mins without fatigue or drop in O2 OT Short Term Goal 1 - Progress (Week 1): Met OT Short Term Goal 2 (Week 1): Pt will perform UB ADLs at EOB/sink with CGA OT Short Term Goal 2 - Progress (Week 1): Met OT Short Term Goal 3 (Week 1): Pt will perform LB dress with AE PRN with Mod A OT Short Term Goal 3 - Progress (Week  1): Met OT Short Term Goal 4 (Week 1): Pt will perform toilet transfer with Max of 1 OT Short Term Goal 4 - Progress (Week 1): Met Week 2:  OT Short Term Goal 1 (Week 2): Patient will compete 1/3 parts of LB dressing task with no more than Min A. OT Short Term Goal 2 (Week 2): Patient will complete toilet transfer with LRAD and Min A. OT Short Term Goal 3 (Week 2): Patient will complete 3/3 parts of toileting task with Min A. OT Short Term Goal 4 (Week 2): Patient will complete 1 grooming task in standing without rest break or significant drop in SpO2.  Skilled Therapeutic Interventions/Progress Updates:  Patient met lying supine in bed in agreement with OT treatment session. Patient notes pain this a.m. in upper back. RN administered medication prior to OT entry. 2L O2 via [place] with SpO2 98% and HR 104pbm at beginning of session. Vitals monitored throughout with no drastic change noted. Patient with request to complete bathing/dressing at sink level. UB bathing/dressing with set-up assist, increased time and frequent rest breaks 2/2 fatigue. Patient able to stand with Mod A at sink level to doff LB clothing over hips with completion of task seated in wc requiring. In standing, patient able to wash front perineal area and buttocks. Patient continues to require assist to thread BLE through LB clothing and would likely benefit from use of AE to increase independence. Oral hygiene seated at sink level with set-up assist. Session concluded with patient seated in wc with call bell within reach and all needs met.   Therapy Documentation Precautions:    Occupational Therapy Weekly Progress Note  Patient Details  Name: Stacie [name] MRN: [id number] Date of Birth: 02/03/1956  Beginning of progress report period: July 29, 2020 End of progress report period: August 06, 2020  Today's Date: 08/06/2020 OT Individual Time: 0845-1000 OT Individual Time Calculation (min): 75 min    Patient has met 4 of 4 short term goals. Patient currently demonstrates increased activity tolerance for OOB ADL tasks utilizing energy conservation techniques and rest breaks as needed. Upon evaluation, patient was only tolerating bathing/dressing tasks at bed level but has since progressed to bathing/dressing in sitting/standing at sink level. Seated in wc at sink, patient able to bathe/dress UB with set-up assist. LB bathing with Min A in sitting/standing and LB dressing with Mod A to thread BLE. Patient able to hike LB clothing over hips in standing with and without unilateral UE support on sink surface. Upon initial evaluation patient was utilizing Stedy for toilet transfers. Patient currently completes toilet transfers with Mod A progressing to Min A.   Patient continues to demonstrate the following deficits: muscle weakness, decreased cardiorespiratoy endurance, decreased standing balance, and decreased balance strategies  and therefore will continue to benefit from skilled OT intervention to enhance overall performance with BADL, iADL and Reduce care partner burden.  Patient progressing toward long term goals..  Continue plan of care.  OT Short Term Goals Week 1:  OT Short Term Goal 1 (Week 1): Pt will participate in seated ADL activity for 10 mins without fatigue or drop in O2 OT Short Term Goal 1 - Progress (Week 1): Met OT Short Term Goal 2 (Week 1): Pt will perform UB ADLs at EOB/sink with CGA OT Short Term Goal 2 - Progress (Week 1): Met OT Short Term Goal 3 (Week 1): Pt will perform LB dress with AE PRN with Mod A OT Short Term Goal 3 - Progress (Week  1): Met OT Short Term Goal 4 (Week 1): Pt will perform toilet transfer with Max of 1 OT Short Term Goal 4 - Progress (Week 1): Met Week 2:  OT Short Term Goal 1 (Week 2): Patient will compete 1/3 parts of LB dressing task with no more than Min A. OT Short Term Goal 2 (Week 2): Patient will complete toilet transfer with LRAD and Min A. OT Short Term Goal 3 (Week 2): Patient will complete 3/3 parts of toileting task with Min A. OT Short Term Goal 4 (Week 2): Patient will complete 1 grooming task in standing without rest break or significant drop in SpO2.  Skilled Therapeutic Interventions/Progress Updates:  Patient met lying supine in bed in agreement with OT treatment session. Patient notes pain this a.m. in upper back. RN administered medication prior to OT entry. 2L O2 via [place] with SpO2 98% and HR 104pbm at beginning of session. Vitals monitored throughout with no drastic change noted. Patient with request to complete bathing/dressing at sink level. UB bathing/dressing with set-up assist, increased time and frequent rest breaks 2/2 fatigue. Patient able to stand with Mod A at sink level to doff LB clothing over hips with completion of task seated in wc requiring. In standing, patient able to wash front perineal area and buttocks. Patient continues to require assist to thread BLE through LB clothing and would likely benefit from use of AE to increase independence. Oral hygiene seated at sink level with set-up assist. Session concluded with patient seated in wc with call bell within reach and all needs met.   Therapy Documentation Precautions:  

## 2020-08-06 NOTE — Progress Notes (Signed)
Occupational Therapy Session Note  Patient Details  Name: Ambria A Stivers MRN: [id number] Date of Birth: 07/19/1956  Today's Date: 08/06/2020 OT Individual Time: 1345-1500 OT Individual Time Calculation (min): 75 min    Short Term Goals: Week 1:  OT Short Term Goal 1 (Week 1): Pt will participate in seated ADL activity for 10 mins without fatigue or drop in O2 OT Short Term Goal 1 - Progress (Week 1): Met OT Short Term Goal 2 (Week 1): Pt will perform UB ADLs at EOB/sink with CGA OT Short Term Goal 2 - Progress (Week 1): Met OT Short Term Goal 3 (Week 1): Pt will perform LB dress with AE PRN with Mod A OT Short Term Goal 3 - Progress (Week 1): Met OT Short Term Goal 4 (Week 1): Pt will perform toilet transfer with Max of 1 OT Short Term Goal 4 - Progress (Week 1): Met Week 2:  OT Short Term Goal 1 (Week 2): Patient will compete 1/3 parts of LB dressing task with no more than Min A. OT Short Term Goal 2 (Week 2): Patient will complete toilet transfer with LRAD and Min A. OT Short Term Goal 3 (Week 2): Patient will complete 3/3 parts of toileting task with Min A. OT Short Term Goal 4 (Week 2): Patient will complete 1 grooming task in standing without rest break or significant drop in SpO2.  Skilled Therapeutic Interventions/Progress Updates:  OT treatment session with focus on activity tolerance, energy conservation techniques, IADLs and BUE strengthening as detailed below. Patient reporting 0/10 pain at rest and with activity. Wc mobility to patient laundry room without rest break. Patient able to stand to load washing machine and reach outside of BOS to retrieve laundry pods. 1 episode of posterior LOB requiring Min A to correct. Wc transport to outdoor area near hospital atrium with total A for energy conservation and time management. BUE HEP with 2lb weighted dowel for 2 sets x10 reps each for arm curls, chest press, straight arm raises, and arm circles. Return to hospital room in same  manner as above. Patient engaged in standing tolerance activity with and without unilateral UE support on RW during connect-4 set-up and game. Return to supine with CGA. Session concluded with patient lying supine in bed with call bell within reach, bed alarm activated, and all needs met.   Therapy Documentation Precautions:  Precautions Precautions: Fall Precaution Comments: monitor O2, 2L continuous Restrictions Weight Bearing Restrictions: No General:   Therapy/Group: Individual Therapy   R Howerton-Davis 08/06/2020, 11:12 AM 

## 2020-08-06 NOTE — Progress Notes (Signed)
Physical Therapy Session Note  Patient Details  Name: Stacie [name] MRN: [id number] Date of Birth: 1956/03/28  Today's Date: 08/06/2020 PT Individual Time: 2755-6239 PT Individual Time Calculation (min): 60 min   Short Term Goals: Week 2:  PT Short Term Goal 1 (Week 2): Pt will transfer to and from [facility] with min assist PT Short Term Goal 2 (Week 2): Pt wil ambulate 38f with RW and min assist PT Short Term Goal 3 (Week 2): Pt will ascend 4 steps with BUE support on mod assist  Skilled Therapeutic Interventions/Progress Updates:   Pt received sitting in WC and agreeable to PT. PT applied portable O2 to patient. WC mobility through hall 2 x 1233fwith supervision assist and cues for safety and turning technique to navigate tight spaces and obstacles.   Sit<>[facility]mod assist from PT throughout session x 10, cues for improved anterior weight shift from PT intermittently.   Gait training with RW x 4519fith min assist overall from PT with cues for step height on the RLE. Noted to have significantly improved heel contact on the LLE compared to prior PT treatment sessions.   nustep BUE/BLE reciprocal endurance training, level 4, 6 min +3 with prolonged therapeutic rest break between bouts. Patient returned to room and [facility] and all needs met.          Therapy Documentation Precautions:  Precautions Precautions: Fall Precaution Comments: monitor O2, 2L continuous Restrictions Weight Bearing Restrictions: No    Vital Signs: Therapy Vitals Temp: 98.3 F (36.8 C) Temp Source: Oral Pulse Rate: (!) 103 Resp: 20 BP: 122/60 Patient Position (if appropriate): Sitting Oxygen Therapy SpO2: 99 % O2 Device: CPAP O2 Flow Rate (L/min): 2 L/min Pain:   denies    Therapy/Group: Individual Therapy  AusLorie Phenix/21/2021, 8:03 AM

## 2020-08-06 NOTE — Progress Notes (Signed)
[facility] PHYSICAL MEDICINE & REHABILITATION PROGRESS NOTE   Subjective/Complaints:  DIscussed baldder training with Pt today , no new issues overnite CHoked a little on egs this am but otherwise breathing ok. Pt indicates Tyvasa has been a "life changing medication for her"  ROS: Patient denies CP, SOB N/V/D  Objective:   No results found. No results for input(s): WBC, HGB, HCT, PLT in the last 72 hours. No results for input(s): NA, K, CL, CO2, GLUCOSE, BUN, CREATININE, CALCIUM in the last 72 hours.  Intake/Output Summary (Last 24 hours) at 08/06/2020 0804 Last data filed at 08/06/2020 0700 Gross per 24 hour  Intake 1279 ml  Output 2100 ml  Net -821 ml        Physical Exam: Vital Signs Blood pressure 122/60, pulse (!) 103, temperature 98.3 F (36.8 C), temperature source Oral, resp. rate 20, height _0  (1.727 m), weight 79.6 kg, SpO2 99 %.  General: No acute distress Mood and affect are appropriate Heart: Regular rate and rhythm no rubs murmurs or extra sounds Lungs: Clear to auscultation, breathing unlabored, no rales or wheezes Abdomen: Positive bowel sounds, soft nontender to palpation, nondistended Extremities: No clubbing, cyanosis, or edema  Skin:cyanosis Right distal great toe, Left 2nd, 3rd toes and MTP area not progressing --no changes today        10/20, rupture of bulla      Neurologic: Cranial nerves II through XII intact, motor strength is 5/5 in bilateral deltoid, bicep, tricep, grip,  2- to 2 Bilat HF, 3- B KE, ADF/PF   Musculoskeletal:pain free active assisted  range of motion in all 4 extremities. No joint swelling   Assessment/Plan: 1. Functional deficits secondary to debility post urosepsis which require 3+ hours per day of interdisciplinary therapy in a comprehensive inpatient rehab setting.  Physiatrist is providing close team supervision and 24 hour management of active medical problems listed below.  Physiatrist and rehab  team continue to assess barriers to discharge/monitor patient progress toward functional and medical goals  Care Tool:  Bathing    Body parts bathed by patient: Right arm, Left arm, Chest, Abdomen, Front perineal area, Buttocks   Body parts bathed by helper: Buttocks, Right lower leg, Left lower leg     Bathing assist Assist Level: Minimal Assistance - Patient > 75%     Upper Body Dressing/Undressing Upper body dressing   What is the patient wearing?: Pull over shirt    Upper body assist Assist Level: Set up assist    Lower Body Dressing/Undressing Lower body dressing      What is the patient wearing?: Pants, Incontinence brief     Lower body assist Assist for lower body dressing: Maximal Assistance - Patient 25 - 49%     Toileting Toileting Toileting Activity did not occur (Clothing management and hygiene only): N/A (no void or bm)  Toileting assist Assist for toileting: Moderate Assistance - Patient 50 - 74%     Transfers Chair/bed transfer  Transfers assist     Chair/bed transfer assist level: Minimal Assistance - Patient > 75%     Locomotion Ambulation   Ambulation assist   Ambulation activity did not occur: Safety/medical concerns  Assist level: Minimal Assistance - Patient > 75% Assistive device: Walker-rolling Max distance: 1f   Walk 10 feet activity   Assist  Walk 10 feet activity did not occur: Safety/medical concerns  Assist level: Minimal Assistance - Patient > 75% Assistive device: Walker-rolling   Walk 50 feet activity   Assist  Walk 50 feet with 2 turns activity did not occur: Safety/medical concerns         Walk 150 feet activity   Assist Walk 150 feet activity did not occur: Safety/medical concerns         Walk 10 feet on uneven surface  activity   Assist Walk 10 feet on uneven surfaces activity did not occur: Safety/medical concerns         Wheelchair     Assist Will patient use wheelchair at  discharge?: Yes (Per PT long term goals ) Type of Wheelchair: Manual Wheelchair activity did not occur: Safety/medical concerns  Wheelchair assist level: Minimal Assistance - Patient > 75% Max wheelchair distance: 120    Wheelchair 50 feet with 2 turns activity    Assist    Wheelchair 50 feet with 2 turns activity did not occur: Safety/medical concerns   Assist Level: Minimal Assistance - Patient > 75%   Wheelchair 150 feet activity     Assist  Wheelchair 150 feet activity did not occur: Safety/medical concerns   Assist Level: Minimal Assistance - Patient > 75%   Blood pressure 122/60, pulse (!) 103, temperature 98.3 F (36.8 C), temperature source Oral, resp. rate 20, height _0  (1.727 m), weight 79.6 kg, SpO2 99 %.  Medical Problem List and Plan: 1.  Altered mental status with decreased functional mobility secondary to sepsis/encephalopathy/embolic cerebral infarcts              -patient may shower             -ELOS/Goals: minA 11/3 CIR PT, OT, SLP  2.  Antithrombotics: -DVT/anticoagulation: Subcutaneous heparin             -antiplatelet therapy: Aspirin 81 mg daily 3. Pain Management: Tylenol as needed. Well controlled.  4. Mood: Lexapro 20 mg daily             -antipsychotic agents: N/A 5. Neuropsych: This patient is capable of making decisions on her own behalf. 6. Skin/Wound Care: Routine skin checks, add xeroform telfa and kerlix Left foot  Peripheral vasc disease vs small veseel embolic infarcts related to sepsis Cyanotic left toes #1,2,3, and R #1, plan for VVS is to monitor  Demarcation and f/u as OP   10/18 post op sandal, continue local care 7. Fluids/Electrolytes/Nutrition: Routine in and outs with follow-up chemistries 8.  Pulmonary sarcoidosis.  Patient on 2 L nasal cannula baseline.  Follow-up outpatient 9.  Hypertension.  Monitor with increased mobility. Well controlled.  Vitals:   08/06/20 0426 08/06/20 0428  BP: (!) 109/53 122/60  Pulse: 97  (!) 103  Resp: 20   Temp: 98.3 F (36.8 C)   SpO2: 98% 99%  controlled 10/21 10.  Asthma.  Continue inhalers as directed 11.  Hyperlipidemia.  Lipitor 12.  Hypothyroidism.  Synthroid 13.  AKI.  Follow-up chemistries 14.  Bilateral lower extremity ischemia.  No urgent indication for lower extremity angiogram at present.  Follow-up vascular surgery. Good pulses B dorsalis pedis,likely small vessel occlusions 15.  Urinary retention. Should be able to  DC foley and initiate voiding trial- would like mobility to improve to allow toileting at least on BSC, pt does not recall difficulty with I/O caths  Will d/c foley today and start bladder training - discussed with RN  16.  Leukocytosis associated with bacteremia - resolved  17.  Constipation- had bm 10/17   LOS: 9 days A FACE TO FACE EVALUATION WAS PERFORMED  Charlett Blake 08/06/2020,  8:04 AM

## 2020-08-06 NOTE — Progress Notes (Signed)
Cath removed per order, patient tolerated procedure

## 2020-08-07 ENCOUNTER — Inpatient Hospital Stay (HOSPITAL_COMMUNITY): Payer: BC Managed Care – PPO | Admitting: Physical Therapy

## 2020-08-07 ENCOUNTER — Inpatient Hospital Stay (HOSPITAL_COMMUNITY): Payer: BC Managed Care – PPO | Admitting: Occupational Therapy

## 2020-08-07 LAB — GLUCOSE, CAPILLARY
Glucose-Capillary: 108 mg/dL — ABNORMAL HIGH (ref 70–99)
Glucose-Capillary: 163 mg/dL — ABNORMAL HIGH (ref 70–99)
Glucose-Capillary: 171 mg/dL — ABNORMAL HIGH (ref 70–99)
Glucose-Capillary: 169 mg/dL — ABNORMAL HIGH (ref 70–99)

## 2020-08-07 NOTE — Progress Notes (Signed)
Occupational Therapy Session Note  Patient Details  Name: TAQWA DEEM MRN: [id number] Date of Birth: 1956/05/06  Today's Date: 08/07/2020 OT Individual Time: 1030-1130 OT Individual Time Calculation (min): 60 min    Short Term Goals: Week 2:  OT Short Term Goal 1 (Week 2): Patient will compete 1/3 parts of LB dressing task with no more than Min A. OT Short Term Goal 2 (Week 2): Patient will complete toilet transfer with LRAD and Min A. OT Short Term Goal 3 (Week 2): Patient will complete 3/3 parts of toileting task with Min A. OT Short Term Goal 4 (Week 2): Patient will complete 1 grooming task in standing without rest break or significant drop in SpO2.  Skilled Therapeutic Interventions/Progress Updates:  Patient met lying supine in bed in agreement with OT treatment session. 0/10 pain at rest and with activity. Patient remained on 2L O2 via [place] throughout treatment session. Sit to stand various surfaces with Min to Mod A for boosting. Toilet transfer with Min A. Patient able to pull LB clothing down from hips with Min guard to maintain standing balance only. Continent of bowel and bladder. Hygiene management in sitting with supervision A. In standing, patient able to hike pants over hips with use of BUE and Min guard for balance. Seated rest break prior to washing hands seated at sink level. Wc mobility from room <> ortho gym with focus on endurance, pursed lip breathing strategies, and BUE strengthening. 2-3 rest breaks 2/2 fatigue. Therapeutic exercise with use of BUE arm ergometer for bout of 5 min on level 2 with RPM 19-20. Session concluded with patient seated in wc with call bell within reach and all needs met.   Therapy Documentation Precautions:  Precautions Precautions: Fall Precaution Comments: monitor O2, 2L continuous Restrictions Weight Bearing Restrictions: No General:    Therapy/Group: Individual Therapy  Lorae Roig R Howerton-Davis 08/07/2020, 10:54 AM

## 2020-08-07 NOTE — Progress Notes (Signed)
While pt was up at bedside cammode, and transfer to bed with stedy, pt began feeling nauseous, sob, dizzy and extremely weak. Required 2+ assistance to get raise up on stedy. When pt laid down she stated her breathing got better and nausea went away. Pt had medium sized bm and stated she did not strain with passing of stool. Pt currently laying in bed and is beginning to feel better. No other concerns to report at this time.

## 2020-08-07 NOTE — Progress Notes (Signed)
[place] PHYSICAL MEDICINE & REHABILITATION PROGRESS NOTE   Subjective/Complaints:  Foley removed yesterday , has been voiding  Episode of dizziness when up on commode yesterday  ROS: Patient denies CP, SOB N/V/D  Objective:   No results found. No results for input(s): WBC, HGB, HCT, PLT in the last 72 hours. No results for input(s): NA, K, CL, CO2, GLUCOSE, BUN, CREATININE, CALCIUM in the last 72 hours.  Intake/Output Summary (Last 24 hours) at 08/07/2020 0755 Last data filed at 08/06/2020 2055 Gross per 24 hour  Intake 677 ml  Output 775 ml  Net -98 ml        Physical Exam: Vital Signs Blood pressure (!) 108/56, pulse 91, temperature (!) 97.5 F (36.4 C), temperature source Oral, resp. rate 18, height _0  (1.727 m), weight 79.6 kg, SpO2 97 %.  General: No acute distress Mood and affect are appropriate Heart: Regular rate and rhythm no rubs murmurs or extra sounds Lungs: Clear to auscultation, breathing unlabored, no rales or wheezes Abdomen: Positive bowel sounds, soft nontender to palpation, nondistended Extremities: No clubbing, cyanosis, or edema  Skin:cyanosis Right distal great toe, Left 2nd, 3rd toes and MTP area not progressing --no changes today        10/20, rupture of bulla       10/22  Neurologic: Cranial nerves II through XII intact, motor strength is 5/5 in bilateral deltoid, bicep, tricep, grip,  2- to 2 Bilat HF, 3- B KE, ADF/PF   Musculoskeletal:pain free active assisted  range of motion in all 4 extremities. No joint swelling   Assessment/Plan: 1. Functional deficits secondary to debility post urosepsis which require 3+ hours per day of interdisciplinary therapy in a comprehensive inpatient rehab setting.  Physiatrist is providing close team supervision and 24 hour management of active medical problems listed below.  Physiatrist and rehab team continue to assess barriers to discharge/monitor patient progress toward functional  and medical goals  Care Tool:  Bathing    Body parts bathed by patient: Right arm, Left arm, Chest, Abdomen, Front perineal area, Buttocks, Right upper leg, Left upper leg, Right lower leg, Left lower leg, Face   Body parts bathed by helper: Buttocks, Right lower leg, Left lower leg     Bathing assist Assist Level: Minimal Assistance - Patient > 75%     Upper Body Dressing/Undressing Upper body dressing   What is the patient wearing?: Pull over shirt    Upper body assist Assist Level: Set up assist    Lower Body Dressing/Undressing Lower body dressing      What is the patient wearing?: Pants, Incontinence brief     Lower body assist Assist for lower body dressing: Moderate Assistance - Patient 50 - 74%     Toileting Toileting Toileting Activity did not occur (Clothing management and hygiene only): N/A (no void or bm)  Toileting assist Assist for toileting: Moderate Assistance - Patient 50 - 74%     Transfers Chair/bed transfer  Transfers assist     Chair/bed transfer assist level: Minimal Assistance - Patient > 75%     Locomotion Ambulation   Ambulation assist   Ambulation activity did not occur: Safety/medical concerns  Assist level: Minimal Assistance - Patient > 75% Assistive device: Walker-rolling Max distance: 80f   Walk 10 feet activity   Assist  Walk 10 feet activity did not occur: Safety/medical concerns  Assist level: Minimal Assistance - Patient > 75% Assistive device: Walker-rolling   Walk 50 feet activity   Assist  Walk 50 feet with 2 turns activity did not occur: Safety/medical concerns         Walk 150 feet activity   Assist Walk 150 feet activity did not occur: Safety/medical concerns         Walk 10 feet on uneven surface  activity   Assist Walk 10 feet on uneven surfaces activity did not occur: Safety/medical concerns         Wheelchair     Assist Will patient use wheelchair at discharge?: Yes (Per PT  long term goals ) Type of Wheelchair: Manual Wheelchair activity did not occur: Safety/medical concerns  Wheelchair assist level: Minimal Assistance - Patient > 75% Max wheelchair distance: 120    Wheelchair 50 feet with 2 turns activity    Assist    Wheelchair 50 feet with 2 turns activity did not occur: Safety/medical concerns   Assist Level: Minimal Assistance - Patient > 75%   Wheelchair 150 feet activity     Assist  Wheelchair 150 feet activity did not occur: Safety/medical concerns   Assist Level: Minimal Assistance - Patient > 75%   Blood pressure (!) 108/56, pulse 91, temperature (!) 97.5 F (36.4 C), temperature source Oral, resp. rate 18, height _0  (1.727 m), weight 79.6 kg, SpO2 97 %.  Medical Problem List and Plan: 1.  Altered mental status with decreased functional mobility secondary to sepsis/encephalopathy/embolic cerebral infarcts              -patient may shower             -ELOS/Goals: minA 11/3 CIR PT, OT, SLP  2.  Antithrombotics: -DVT/anticoagulation: Subcutaneous heparin             -antiplatelet therapy: Aspirin 81 mg daily 3. Pain Management: Tylenol as needed. Well controlled.  4. Mood: Lexapro 20 mg daily             -antipsychotic agents: N/A 5. Neuropsych: This patient is capable of making decisions on her own behalf. 6. Skin/Wound Care: Routine skin checks, add xeroform telfa and kerlix Left foot  Peripheral vasc disease vs small veseel embolic infarcts related to sepsis Cyanotic left toes #1,2,3, and R #1, plan for VVS is to monitor  Demarcation and f/u as OP   10/18 post op sandal, continue local care 7. Fluids/Electrolytes/Nutrition: Routine in and outs with follow-up chemistries 8.  Pulmonary sarcoidosis.  Patient on 2 L nasal cannula baseline.  Follow-up outpatient 9.  Hypertension.  Monitor with increased mobility. Well controlled.  Vitals:   08/07/20 0509 08/07/20 0511  BP: (!) 124/55 (!) 108/56  Pulse: 91 91  Resp: 20 18   Temp: (!) 97.5 F (36.4 C)   SpO2: 98% 97%  controlled 10/22 10.  Asthma.  Continue inhalers as directed 11.  Hyperlipidemia.  Lipitor 12.  Hypothyroidism.  Synthroid 13.  AKI.  Follow-up chemistries 14.  Bilateral lower extremity ischemia.  No urgent indication for lower extremity angiogram at present.  Follow-up vascular surgery. Good pulses B dorsalis pedis,likely small vessel occlusions 15.  Urinary retention.Resolved, voiding after foley removal   16.  Leukocytosis associated with bacteremia - resolved  17.  Constipation- had bm 10/22   LOS: 10 days A FACE TO FACE EVALUATION WAS PERFORMED  Charlett Blake 08/07/2020, 7:55 AM

## 2020-08-07 NOTE — Progress Notes (Signed)
Physical Therapy Session Note  Patient Details  Name: Stacie [name] MRN: [id number] Date of Birth: [birth year]  Today's Date: 08/07/2020 PT Individual Time: 0810-0903 PT Individual Time Calculation (min): 53 min   Short Term Goals: Week 2:  PT Short Term Goal 1 (Week 2): Pt will transfer to and from [facility] with min assist PT Short Term Goal 2 (Week 2): Pt wil ambulate 11f with RW and min assist PT Short Term Goal 3 (Week 2): Pt will ascend 4 steps with BUE support on mod assist  Skilled Therapeutic Interventions/Progress Updates:    Pt received supine in bed finishing breathing treatment - RN present to remove. Received and maintained on 2L of O2 via nasal cannula. Pt agreeable to therapy session though reports she had some frequent urination last night after catheter being removed causing her not to rest well along with some "breathing issues." Supine>sitting R EOB, HOB elevated ~40degrees, with supervision and increased time. Pt required prolonged seated rest break EOB with pt stating "I ran out of gas this AM and haven't fully recovered." Seated EOB donned pants with min assist for threading LEs then donned post-op shoes max assist.. Sit<>stand slightly elevated EOB<>RW with min assist for lifting and CGA for steadying in standing while pt pulled pants up over hips without assist. L stand pivot to w/c using RW with CGA for steadying. Therapist retrieved taller RW for improved pt fit. Pt reports due to significant fatigue she declines ambulating and standing at this time. Performed the following seated B LE exercises focusing on LE strength, activity tolerance/cardiopulmonary endurance, core strength/activation (removed w/c arm rests for decreased trunk support): - long arc quads 2x15 reps using 2.5lb ankle weights - marching 2x15 reps  - hip abduction 2x15reps against level 1 theraband resistance - hip adduction 2x15 reps with 5 second hold Pt requesting for assistance back to bed to rest. R  stand pivot w/c>EOB using RW with min assist for lifting into standing and CGA for steadying while turning. Sit>supine with CGA for trunk descent. Pt left supine in bed with needs in reach, lines intact, and bed alarm on.  Monitored vitals throughout: BP: 125/58 (MAP 74) HR: varying from 103-113bpm SpO2: 94-98%  Therapy Documentation Precautions:  Precautions Precautions: Fall Precaution Comments: monitor O2, 2L continuous Restrictions Weight Bearing Restrictions: No  Pain:   No reports of pain throughout session.   Therapy/Group: Individual Therapy  CTawana Scale, PT, DPT, CSRS  08/07/2020, 7:51 AM

## 2020-08-07 NOTE — Progress Notes (Signed)
Physical Therapy Session Note  Patient Details  Name: Stacie [name] MRN: [id number] Date of Birth: January 25, 1956  Today's Date: 08/07/2020 PT Individual Time: 1320-1429 PT Individual Time Calculation (min): 69 min   Short Term Goals: Week 2:  PT Short Term Goal 1 (Week 2): Pt will transfer to and from [facility] with min assist PT Short Term Goal 2 (Week 2): Pt wil ambulate 34f with RW and min assist PT Short Term Goal 3 (Week 2): Pt will ascend 4 steps with BUE support on mod assist  Skilled Therapeutic Interventions/Progress Updates:   Pt received supine in bed and agreeable to PT. Supine>sit transfer with supervision assist and cues for LE safety. PT assisted pt to don post-op shoes. [facility].   Pt performed WC mobility through hall x 1562fwith supervision assist,  and [facility] cement [facility] with min assist to control on unlevel grade.   Gait training on cement sidewalk x 502fith CGA-min assist from PT and BUE support on RW. Pt noted to have mild flexed posture but improved heel contact Bil compared to prior sessions.   BLE therex: Sit<>stand x 5.  LAQ 2 x 8  Sanding HS curls 2 x 5 BLE  Cues  for proper UE placement to power into standing as well as decreased speed of eccentric movement.   Dynamic balance training while engaged in fine motor and problem solving task to complete foam block puzzles of medium difficulty x 2 with supervision assist from PT, no knee instability noted.   Ambulatory transfer to toilet with CGA from PTT with cues for AD management up/down threshold into bathroom door. Pt performed pericare and clothing management with supervision assist from PT. Pt returned to room and performed ambulatory transfer to bed with RW and CGA. Sit>supine completed with supervision assist, and left supine in bed with call bell in reach and all needs met.  .      Therapy Documentation Precautions:   Precautions Precautions: Fall Precaution Comments: monitor O2, 2L continuous Restrictions Weight Bearing Restrictions: No    Vital Signs: Therapy Vitals Temp: 98.5 F (36.9 C) Pulse Rate: (!) 101 Resp: 19 BP: (!) 104/55 Patient Position (if appropriate): Lying Oxygen Therapy SpO2: 99 % O2 Device: Room Air Pain: denies   Therapy/Group: Individual Therapy  AusLorie Phenix/22/2021, 3:37 PM

## 2020-08-08 ENCOUNTER — Inpatient Hospital Stay (HOSPITAL_COMMUNITY): Payer: BC Managed Care – PPO

## 2020-08-08 ENCOUNTER — Inpatient Hospital Stay (HOSPITAL_COMMUNITY): Payer: BC Managed Care – PPO | Admitting: Physical Therapy

## 2020-08-08 DIAGNOSIS — D62 Acute posthemorrhagic anemia: Secondary | ICD-10-CM

## 2020-08-08 DIAGNOSIS — K5901 Slow transit constipation: Secondary | ICD-10-CM

## 2020-08-08 DIAGNOSIS — N179 Acute kidney failure, unspecified: Secondary | ICD-10-CM

## 2020-08-08 DIAGNOSIS — Z9981 Dependence on supplemental oxygen: Secondary | ICD-10-CM

## 2020-08-08 DIAGNOSIS — D696 Thrombocytopenia, unspecified: Secondary | ICD-10-CM

## 2020-08-08 LAB — GLUCOSE, CAPILLARY
Glucose-Capillary: 86 mg/dL (ref 70–99)
Glucose-Capillary: 164 mg/dL — ABNORMAL HIGH (ref 70–99)
Glucose-Capillary: 127 mg/dL — ABNORMAL HIGH (ref 70–99)

## 2020-08-08 NOTE — Progress Notes (Signed)
[place] PHYSICAL MEDICINE & REHABILITATION PROGRESS NOTE   Subjective/Complaints: Patient seen sitting up in bed this morning.  She states she slept well overnight.  She denies complaints.  ROS: Denies CP, SOB, N/V/D  Objective:   No results found. No results for input(s): WBC, HGB, HCT, PLT in the last 72 hours. No results for input(s): NA, K, CL, CO2, GLUCOSE, BUN, CREATININE, CALCIUM in the last 72 hours.  Intake/Output Summary (Last 24 hours) at 08/08/2020 1235 Last data filed at 08/08/2020 0830 Gross per 24 hour  Intake 560 ml  Output --  Net 560 ml        Physical Exam: Vital Signs Blood pressure 122/67, pulse 88, temperature (!) 97.4 F (36.3 C), resp. rate (!) 24, height _0  (1.727 m), weight 78.8 kg, SpO2 95 %. Constitutional: No distress . Vital signs reviewed. HENT: Normocephalic.  Atraumatic. Eyes: EOMI. No discharge. Cardiovascular: No JVD.  RRR. Respiratory: Normal effort.  No stridor.  Bilateral clear to auscultation.  + [place]. GI: Non-distended.  BS +. Skin: Ischemic changes distal bilateral lower extremities Psych: Normal mood.  Normal behavior. Musc: Bilateral lower extremity edema.  No tenderness in extremities. Neuro: Alert HOH Motor: Motor: Bilateral upper extremities: 5/5 proximal distal Bilateral lower extremities: Hip flexion: 2+/5, knee extension 3/5, ankle dorsiflexion 3/5   Assessment/Plan: 1. Functional deficits secondary to debility post urosepsis which require 3+ hours per day of interdisciplinary therapy in a comprehensive inpatient rehab setting.  Physiatrist is providing close team supervision and 24 hour management of active medical problems listed below.  Physiatrist and rehab team continue to assess barriers to discharge/monitor patient progress toward functional and medical goals  Care Tool:  Bathing    Body parts bathed by patient: Right arm, Left arm, Chest, Abdomen, Front perineal area, Buttocks, Right upper leg, Left  upper leg, Right lower leg, Left lower leg, Face   Body parts bathed by helper: Buttocks, Right lower leg, Left lower leg     Bathing assist Assist Level: Minimal Assistance - Patient > 75%     Upper Body Dressing/Undressing Upper body dressing   What is the patient wearing?: Pull over shirt    Upper body assist Assist Level: Set up assist    Lower Body Dressing/Undressing Lower body dressing      What is the patient wearing?: Pants, Incontinence brief     Lower body assist Assist for lower body dressing: Moderate Assistance - Patient 50 - 74%     Toileting Toileting Toileting Activity did not occur (Clothing management and hygiene only): N/A (no void or bm)  Toileting assist Assist for toileting: Moderate Assistance - Patient 50 - 74%     Transfers Chair/bed transfer  Transfers assist     Chair/bed transfer assist level: Minimal Assistance - Patient > 75% Chair/bed transfer assistive device: Programmer, multimedia   Ambulation assist   Ambulation activity did not occur: Safety/medical concerns  Assist level: Minimal Assistance - Patient > 75% Assistive device: Walker-rolling Max distance: 8f   Walk 10 feet activity   Assist  Walk 10 feet activity did not occur: Safety/medical concerns  Assist level: Minimal Assistance - Patient > 75% Assistive device: Walker-rolling   Walk 50 feet activity   Assist Walk 50 feet with 2 turns activity did not occur: Safety/medical concerns         Walk 150 feet activity   Assist Walk 150 feet activity did not occur: Safety/medical concerns  Walk 10 feet on uneven surface  activity   Assist Walk 10 feet on uneven surfaces activity did not occur: Safety/medical concerns         Wheelchair     Assist Will patient use wheelchair at discharge?: Yes (Per PT long term goals ) Type of Wheelchair: Manual Wheelchair activity did not occur: Safety/medical concerns  Wheelchair assist  level: Minimal Assistance - Patient > 75% Max wheelchair distance: 120    Wheelchair 50 feet with 2 turns activity    Assist    Wheelchair 50 feet with 2 turns activity did not occur: Safety/medical concerns   Assist Level: Minimal Assistance - Patient > 75%   Wheelchair 150 feet activity     Assist  Wheelchair 150 feet activity did not occur: Safety/medical concerns   Assist Level: Minimal Assistance - Patient > 75%   Blood pressure 122/67, pulse 88, temperature (!) 97.4 F (36.3 C), resp. rate (!) 24, height _0  (1.727 m), weight 78.8 kg, SpO2 95 %.  Medical Problem List and Plan: 1.  Altered mental status with decreased functional mobility secondary to sepsis/encephalopathy/embolic cerebral infarcts              Continue CIR 2.  Antithrombotics: -DVT/anticoagulation: Subcutaneous heparin             -antiplatelet therapy: Aspirin 81 mg daily 3. Pain Management: Tylenol as needed.   Controlled with meds on 10/23 4. Mood: Lexapro 20 mg daily             -antipsychotic agents: N/A 5. Neuropsych: This patient is capable of making decisions on her own behalf. 6. Skin/Wound Care: Routine skin checks, add xeroform telfa and kerlix Left foot   Peripheral vasc disease vs small veseel embolic infarcts related to sepsis  Cyanotic left toes #1,2,3, and R #1, plan for VVS is to monitor  Demarcation and f/u as OP   10/18 post op sandal, continue local care  Stable on 10/23 7. Fluids/Electrolytes/Nutrition: Routine in and outs. 8.  Pulmonary sarcoidosis.  Patient on 2 L nasal cannula baseline.  Follow-up outpatient  Continue supplemental oxygen 9.  Hypertension.  Monitor with increased mobility. Well controlled.  Vitals:   08/08/20 0511 08/08/20 0859  BP: 122/67   Pulse: 89 88  Resp:  (!) 24  Temp: (!) 97.4 F (36.3 C)   SpO2: 94% 95%   Controlled on 10/23 10.  Asthma.  Continue inhalers as directed 11.  Hyperlipidemia.  Lipitor 12.  Hypothyroidism.  Synthroid 13.   AKI.    Creatinine 1.02 on 10/13, labs ordered for Monday 14.  Bilateral lower extremity ischemia.  No urgent indication for lower extremity angiogram at present.  Follow-up vascular surgery. 15.  Urinary retention.Resolved, voiding after foley removal  16.  Leukocytosis associated with bacteremia - resolved  17.  Constipation  Improving 18.  Acute blood loss anemia  Hemoglobin 10.1 on 10/18, labs ordered for Monday 19.  Thrombocytopenia  Platelets 130 on 10/18, labs ordered for Monday  LOS: 11 days A FACE TO FACE EVALUATION WAS PERFORMED  Jason Hauge Lorie Phenix 08/08/2020, 12:35 PM

## 2020-08-08 NOTE — Progress Notes (Signed)
Occupational Therapy Session Note  Patient Details  Name: Stacie [name] MRN: [id number] Date of Birth: 09-Jan-1956  Today's Date: 08/08/2020 OT Individual Time: 3754-3606 OT Individual Time Calculation (min): 41 min    Short Term Goals: Week 1:  OT Short Term Goal 1 (Week 1): Pt will participate in seated ADL activity for 10 mins without fatigue or drop in O2 OT Short Term Goal 1 - Progress (Week 1): Met OT Short Term Goal 2 (Week 1): Pt will perform UB ADLs at EOB/sink with CGA OT Short Term Goal 2 - Progress (Week 1): Met OT Short Term Goal 3 (Week 1): Pt will perform LB dress with AE PRN with Mod A OT Short Term Goal 3 - Progress (Week 1): Met OT Short Term Goal 4 (Week 1): Pt will perform toilet transfer with Max of 1 OT Short Term Goal 4 - Progress (Week 1): Met  Skilled Therapeutic Interventions/Progress Updates:    Pt received in bed with 0 out of 10 pain  ADL:  Pt completes bathing with at shower level with VC for lateral leans to wash buttkc and pt not washing B feet as covered in bags to keep dry d/t dressings Pt completes UB dressing with set up Pt completes LB dressing with MAX A overall d/t fatigue and time managmeent. Education on threading LLE first d/t bandage/wrapping making more difficult to thread LLE Pt completes footwear with total A d/t fatigue/time management to don Darco shoes  Pt completes shower/Tub transfer with MIN A to power up from elevated surface and ambulate to walk into/out of bathroom with B darco shoes on and VC for sequence of TTB transfer. Pt demo good pursed lip breathing throughout.    Pt left at end of session in bed with exit alarm on, call light in reach and all needs met   Therapy Documentation Precautions:  Precautions Precautions: Fall Precaution Comments: monitor O2, 2L continuous Restrictions Weight Bearing Restrictions: No General:   Vital Signs: Therapy Vitals Temp: (!) 97.4 F (36.3 C) Pulse Rate: 89 BP:  122/67 Patient Position (if appropriate): Lying Oxygen Therapy SpO2: 94 % O2 Device: Room Air Pain:   ADL: ADL Upper Body Bathing: Moderate assistance Where Assessed-Upper Body Bathing: Edge of bed Lower Body Bathing: Maximal assistance Where Assessed-Lower Body Bathing: Edge of bed Upper Body Dressing: Moderate assistance Where Assessed-Upper Body Dressing: Edge of bed Lower Body Dressing: Dependent Where Assessed-Lower Body Dressing: Bed level ADL Comments: bed level ADLs at eval d/t decreased endurance/tolerance Vision   Perception    Praxis   Exercises:   Other Treatments:     Therapy/Group: Individual Therapy  Tonny Branch 08/08/2020, 7:02 AM

## 2020-08-08 NOTE — Progress Notes (Signed)
Physical Therapy Session Note  Patient Details  Name: Stacie [name] MRN: [id number] Date of Birth: [birth year]  Today's Date: 08/08/2020 PT Individual Time: 1523-1616 PT Individual Time Calculation (min): 53 min   Short Term Goals: Week 2:  PT Short Term Goal 1 (Week 2): Pt will transfer to and from [facility] with min assist PT Short Term Goal 2 (Week 2): Pt wil ambulate 28f with RW and min assist PT Short Term Goal 3 (Week 2): Pt will ascend 4 steps with BUE support on mod assist  Skilled Therapeutic Interventions/Progress Updates:   Pt received supine in bed and agreeable to PT. Supine>sit transfer without assist or cues. PT assisted pt to don post-op shoes in BLE sitting EOB. Stand pivot transfer to WC from elevated bed height with supervision assist.   Standing balance. While engaged in fine motor/problem solving task of connect 4, pt abel to tolerate sanding 3 min, 5 min and 2 min respectively with supervision assist from PT throughout. Mild posterior lean initially, but able to correct with cues.   WC mobility through hall x 1523fwith supervision assist and min cues for improved control with L hand turn to reduce turning radius and avoid obstacles.    Stair management training in parallel bars. 2"st x 2, 4"step x 2 and 6"step x 2 with supervision assist progressing to min assist for safety on taller steps. Min cues for step to gait pattern and improved UE placement. Mild L sided knee instability following last bout as well as need for prolonged rest break between bouts.   Kinetron reciprocal movement training x 5 min with supervision assist and at 30cm/sec. [facility] returned to [facility] and [facility] reach and all needs met.          Therapy Documentation Precautions:  Precautions Precautions: Fall Precaution Comments: monitor O2, 2L continuous Restrictions Weight Bearing Restrictions: No Vital Signs: Therapy Vitals Temp: 98.1 F (36.7 C) Pulse Rate:  (!) 104 Resp: 20 BP: (!) 105/56 Patient Position (if appropriate): Sitting Oxygen Therapy SpO2: 95 % O2 Device: Room Air Pain: denies   Therapy/Group: Individual Therapy  AuLorie Phenix0/23/2021, 4:21 PM

## 2020-08-08 NOTE — Progress Notes (Signed)
Pt said she will wear her CPAP by herself. Its her Home unit.

## 2020-08-08 NOTE — Plan of Care (Signed)
  Problem: RH BOWEL ELIMINATION Goal: RH STG MANAGE BOWEL WITH ASSISTANCE Description: STG Manage Bowel with min Assistance. Outcome: Progressing Goal: RH STG MANAGE BOWEL W/MEDICATION W/ASSISTANCE Description: STG Manage Bowel with Medication with mod I Assistance. Outcome: Progressing   Problem: RH SKIN INTEGRITY Goal: RH STG SKIN FREE OF INFECTION/BREAKDOWN Description: With min assist  Outcome: Progressing Goal: RH STG MAINTAIN SKIN INTEGRITY WITH ASSISTANCE Description: STG Maintain Skin Integrity With min Assistance. Outcome: Progressing Goal: RH STG ABLE TO PERFORM INCISION/WOUND CARE W/ASSISTANCE Description: STG Able To Perform Incision/Wound Care With min Assistance. Outcome: Progressing

## 2020-08-08 NOTE — Progress Notes (Signed)
Occupational Therapy Session Note  Patient Details  Name: Stacie [name] MRN: [id number] Date of Birth: 1956/08/26  Today's Date: 08/08/2020 OT Individual Time: 1300-1330 OT Individual Time Calculation (min): 30 min    Short Term Goals: Week 1:  OT Short Term Goal 1 (Week 1): Pt will participate in seated ADL activity for 10 mins without fatigue or drop in O2 OT Short Term Goal 1 - Progress (Week 1): Met OT Short Term Goal 2 (Week 1): Pt will perform UB ADLs at EOB/sink with CGA OT Short Term Goal 2 - Progress (Week 1): Met OT Short Term Goal 3 (Week 1): Pt will perform LB dress with AE PRN with Mod A OT Short Term Goal 3 - Progress (Week 1): Met OT Short Term Goal 4 (Week 1): Pt will perform toilet transfer with Max of 1 OT Short Term Goal 4 - Progress (Week 1): Met  Skilled Therapeutic Interventions/Progress Updates:   Pt received in bed with 0 out of 10 pain     Therapeutic exercise Pt completes 3x30 ball toss (chest, bounce, overhead pass) in seated EOC position to improve BUE coordination/strengthening/endurance required for BADLs/functional transfers. Pt requries prolonged rest breaks d/t poor activty tolernace. HR 117 after activity requiring 1 min to decrease to 100bpm, O2 99%    Therapeutic activity Ambulatory transfers performed with RW to/from bed to w/c at doorway with CGA and VC for hand placement/Rw management   Pt left at end of session in bed with exit alarm on, call light in reach and all needs met   Therapy Documentation Precautions:  Precautions Precautions: Fall Precaution Comments: monitor O2, 2L continuous Restrictions Weight Bearing Restrictions: No General:   Vital Signs: Therapy Vitals Pulse Rate: 88 Resp: (!) 24 Patient Position (if appropriate): Lying Oxygen Therapy SpO2: 95 % O2 Device: Nasal Cannula O2 Flow Rate (L/min): 2 L/min Pain:   ADL: ADL Upper Body Bathing: Moderate assistance Where Assessed-Upper Body Bathing: Edge of  bed Lower Body Bathing: Maximal assistance Where Assessed-Lower Body Bathing: Edge of bed Upper Body Dressing: Moderate assistance Where Assessed-Upper Body Dressing: Edge of bed Lower Body Dressing: Dependent Where Assessed-Lower Body Dressing: Bed level ADL Comments: bed level ADLs at eval d/t decreased endurance/tolerance Vision   Perception    Praxis   Exercises:   Other Treatments:     Therapy/Group: Individual Therapy  Tonny Branch 08/08/2020, 12:10 PM

## 2020-08-09 DIAGNOSIS — R739 Hyperglycemia, unspecified: Secondary | ICD-10-CM

## 2020-08-09 DIAGNOSIS — T380X5A Adverse effect of glucocorticoids and synthetic analogues, initial encounter: Secondary | ICD-10-CM

## 2020-08-09 DIAGNOSIS — R7309 Other abnormal glucose: Secondary | ICD-10-CM

## 2020-08-09 DIAGNOSIS — I10 Essential (primary) hypertension: Secondary | ICD-10-CM

## 2020-08-09 LAB — GLUCOSE, CAPILLARY
Glucose-Capillary: 83 mg/dL (ref 70–99)
Glucose-Capillary: 198 mg/dL — ABNORMAL HIGH (ref 70–99)
Glucose-Capillary: 189 mg/dL — ABNORMAL HIGH (ref 70–99)
Glucose-Capillary: 145 mg/dL — ABNORMAL HIGH (ref 70–99)

## 2020-08-09 NOTE — Plan of Care (Signed)
  Problem: RH BOWEL ELIMINATION Goal: RH STG MANAGE BOWEL WITH ASSISTANCE Description: STG Manage Bowel with min Assistance. Outcome: Progressing Goal: RH STG MANAGE BOWEL W/MEDICATION W/ASSISTANCE Description: STG Manage Bowel with Medication with mod I Assistance. Outcome: Progressing   Problem: RH SKIN INTEGRITY Goal: RH STG SKIN FREE OF INFECTION/BREAKDOWN Description: With min assist  Outcome: Progressing Goal: RH STG MAINTAIN SKIN INTEGRITY WITH ASSISTANCE Description: STG Maintain Skin Integrity With min Assistance. Outcome: Progressing Goal: RH STG ABLE TO PERFORM INCISION/WOUND CARE W/ASSISTANCE Description: STG Able To Perform Incision/Wound Care With min Assistance. Outcome: Progressing   

## 2020-08-09 NOTE — Progress Notes (Addendum)
[place] PHYSICAL MEDICINE & REHABILITATION PROGRESS NOTE   Subjective/Complaints: Patient seen sitting up in bed this morning.  She states she slept well overnight.  ROS: Denies CP, SOB, N/V/D  Objective:   No results found. No results for input(s): WBC, HGB, HCT, PLT in the last 72 hours. No results for input(s): NA, K, CL, CO2, GLUCOSE, BUN, CREATININE, CALCIUM in the last 72 hours.  Intake/Output Summary (Last 24 hours) at 08/09/2020 1219 Last data filed at 08/09/2020 0715 Gross per 24 hour  Intake 480 ml  Output -  Net 480 ml        Physical Exam: Vital Signs Blood pressure 125/86, pulse 88, temperature (!) 97.5 F (36.4 C), temperature source Oral, resp. rate (!) 22, height _0  (1.727 m), weight 78.9 kg, SpO2 100 %.  Constitutional: No distress . Vital signs reviewed. HENT: Normocephalic.  Atraumatic. Eyes: EOMI. No discharge. Cardiovascular: No JVD.  RRR. Respiratory: Normal effort.  No stridor.  Bilateral clear to auscultation.  + Amo. GI: Non-distended.  BS +. Skin: Bilateral lower extremities with dressing CDI Psych: Flat..  Normal behavior. Musc: Bilateral lower extremities with edema. Neuro: Alert HOH Motor: Motor: Bilateral upper extremities: 4/5 proximal distal,?  Full effort Bilateral lower extremities: Hip flexion: 2+/5, knee extension 3/5, ankle dorsiflexion 3/5   Assessment/Plan: 1. Functional deficits secondary to debility post urosepsis which require 3+ hours per day of interdisciplinary therapy in a comprehensive inpatient rehab setting.  Physiatrist is providing close team supervision and 24 hour management of active medical problems listed below.  Physiatrist and rehab team continue to assess barriers to discharge/monitor patient progress toward functional and medical goals  Care Tool:  Bathing    Body parts bathed by patient: Right arm, Left arm, Chest, Abdomen, Front perineal area, Buttocks, Right upper leg, Left upper leg, Right  lower leg, Left lower leg, Face   Body parts bathed by helper: Buttocks, Right lower leg, Left lower leg     Bathing assist Assist Level: Minimal Assistance - Patient > 75%     Upper Body Dressing/Undressing Upper body dressing   What is the patient wearing?: Pull over shirt    Upper body assist Assist Level: Set up assist    Lower Body Dressing/Undressing Lower body dressing      What is the patient wearing?: Pants, Incontinence brief     Lower body assist Assist for lower body dressing: Moderate Assistance - Patient 50 - 74%     Toileting Toileting Toileting Activity did not occur (Clothing management and hygiene only): N/A (no void or bm)  Toileting assist Assist for toileting: Moderate Assistance - Patient 50 - 74%     Transfers Chair/bed transfer  Transfers assist     Chair/bed transfer assist level: Minimal Assistance - Patient > 75% Chair/bed transfer assistive device: Programmer, multimedia   Ambulation assist   Ambulation activity did not occur: Safety/medical concerns  Assist level: Minimal Assistance - Patient > 75% Assistive device: Walker-rolling Max distance: 60f   Walk 10 feet activity   Assist  Walk 10 feet activity did not occur: Safety/medical concerns  Assist level: Minimal Assistance - Patient > 75% Assistive device: Walker-rolling   Walk 50 feet activity   Assist Walk 50 feet with 2 turns activity did not occur: Safety/medical concerns         Walk 150 feet activity   Assist Walk 150 feet activity did not occur: Safety/medical concerns  Walk 10 feet on uneven surface  activity   Assist Walk 10 feet on uneven surfaces activity did not occur: Safety/medical concerns         Wheelchair     Assist Will patient use wheelchair at discharge?: Yes (Per PT long term goals ) Type of Wheelchair: Manual Wheelchair activity did not occur: Safety/medical concerns  Wheelchair assist level: Minimal  Assistance - Patient > 75% Max wheelchair distance: 120    Wheelchair 50 feet with 2 turns activity    Assist    Wheelchair 50 feet with 2 turns activity did not occur: Safety/medical concerns   Assist Level: Minimal Assistance - Patient > 75%   Wheelchair 150 feet activity     Assist  Wheelchair 150 feet activity did not occur: Safety/medical concerns   Assist Level: Minimal Assistance - Patient > 75%    Medical Problem List and Plan: 1.  Altered mental status with decreased functional mobility secondary to sepsis/encephalopathy/embolic cerebral infarcts              Continue CIR 2.  Antithrombotics: -DVT/anticoagulation: Subcutaneous heparin             -antiplatelet therapy: Aspirin 81 mg daily 3. Pain Management: Tylenol as needed.   Controlled with meds on 10/24 4. Mood: Lexapro 20 mg daily             -antipsychotic agents: N/A 5. Neuropsych: This patient is capable of making decisions on her own behalf. 6. Skin/Wound Care: Routine skin checks, add xeroform telfa and kerlix Left foot   Peripheral vasc disease vs small veseel embolic infarcts related to sepsis  Cyanotic left toes #1,2,3, and R #1, plan for VVS is to monitor  Demarcation and f/u as OP   10/18 post op sandal, continue local care  Stable on 10/24 7. Fluids/Electrolytes/Nutrition: Routine in and outs. 8.  Pulmonary sarcoidosis.  Patient on 2 L nasal cannula baseline.  Follow-up outpatient  Continue supplemental oxygen 9.  Hypertension.  Monitor with increased mobility. Well controlled.  Vitals:   08/09/20 0525 08/09/20 0852  BP: 125/86   Pulse: 90 88  Resp: 20 (!) 22  Temp: (!) 97.5 F (36.4 C)   SpO2: 100% 100%   Controlled on 10/24 10.  Asthma.  Continue inhalers as directed 11.  Hyperlipidemia.  Lipitor 12.  Hypothyroidism.  Synthroid 13.  AKI.    Creatinine 1.02 on 10/13, labs ordered for tomorrow 14.  Bilateral lower extremity ischemia.  No urgent indication for lower extremity  angiogram at present.  Follow-up vascular surgery. 15.  Urinary retention.Resolved, voiding after foley removal  16.  Leukocytosis associated with bacteremia - resolved  17.  Constipation  Improving 18.  Acute blood loss anemia  Hemoglobin 10.1 on 10/18, labs ordered for tomorrow 19.  Thrombocytopenia  Platelets 130 on 10/18, labs ordered for tomorrow 20.  Steroid-induced hyperglycemia  Labile on 10/24, monitor for trend  LOS: 12 days A FACE TO FACE EVALUATION WAS PERFORMED  Erdine Hulen Lorie Phenix 08/09/2020, 12:19 PM

## 2020-08-10 ENCOUNTER — Inpatient Hospital Stay (HOSPITAL_COMMUNITY): Payer: BC Managed Care – PPO | Admitting: Occupational Therapy

## 2020-08-10 ENCOUNTER — Inpatient Hospital Stay (HOSPITAL_COMMUNITY): Payer: BC Managed Care – PPO | Admitting: Physical Therapy

## 2020-08-10 LAB — BASIC METABOLIC PANEL
Anion gap: 10 (ref 5–15)
BUN: 26 mg/dL — ABNORMAL HIGH (ref 8–23)
CO2: 32 mmol/L (ref 22–32)
Calcium: 9.4 mg/dL (ref 8.9–10.3)
Chloride: 100 mmol/L (ref 98–111)
Creatinine, Ser: 1.17 mg/dL — ABNORMAL HIGH (ref 0.44–1.00)
GFR, Estimated: 52 mL/min — ABNORMAL LOW (ref >60)
Glucose, Bld: 128 mg/dL — ABNORMAL HIGH (ref 70–99)
Potassium: 4 mmol/L (ref 3.5–5.1)
Sodium: 142 mmol/L (ref 135–145)

## 2020-08-10 LAB — CBC WITH DIFFERENTIAL/PLATELET
Abs Immature Granulocytes: 0.11 10*3/uL — ABNORMAL HIGH (ref 0.00–0.07)
Basophils Absolute: 0 10*3/uL (ref 0.0–0.1)
Basophils Relative: 0 %
Eosinophils Absolute: 0.2 10*3/uL (ref 0.0–0.5)
Eosinophils Relative: 4 %
HCT: 31.7 % — ABNORMAL LOW (ref 36.0–46.0)
Hemoglobin: 9.8 g/dL — ABNORMAL LOW (ref 12.0–15.0)
Immature Granulocytes: 2 %
Lymphocytes Relative: 20 %
Lymphs Abs: 1.1 10*3/uL (ref 0.7–4.0)
MCH: 31.9 pg (ref 26.0–34.0)
MCHC: 30.9 g/dL (ref 30.0–36.0)
MCV: 103.3 fL — ABNORMAL HIGH (ref 80.0–100.0)
Monocytes Absolute: 0.4 10*3/uL (ref 0.1–1.0)
Monocytes Relative: 7 %
Neutro Abs: 3.7 10*3/uL (ref 1.7–7.7)
Neutrophils Relative %: 67 %
Platelets: 151 10*3/uL (ref 150–400)
RBC: 3.07 MIL/uL — ABNORMAL LOW (ref 3.87–5.11)
RDW: 16.5 % — ABNORMAL HIGH (ref 11.5–15.5)
WBC: 5.5 10*3/uL (ref 4.0–10.5)
nRBC: 0 % (ref 0.0–0.2)

## 2020-08-10 LAB — GLUCOSE, CAPILLARY
Glucose-Capillary: 94 mg/dL (ref 70–99)
Glucose-Capillary: 120 mg/dL — ABNORMAL HIGH (ref 70–99)
Glucose-Capillary: 161 mg/dL — ABNORMAL HIGH (ref 70–99)
Glucose-Capillary: 152 mg/dL — ABNORMAL HIGH (ref 70–99)

## 2020-08-10 NOTE — Progress Notes (Signed)
[facility] PHYSICAL MEDICINE & REHABILITATION PROGRESS NOTE   Subjective/Complaints:   ROS: Denies CP, SOB, N/V/D  Objective:   No results found. No results for input(s): WBC, HGB, HCT, PLT in the last 72 hours. No results for input(s): NA, K, CL, CO2, GLUCOSE, BUN, CREATININE, CALCIUM in the last 72 hours.  Intake/Output Summary (Last 24 hours) at 08/10/2020 0852 Last data filed at 08/10/2020 0700 Gross per 24 hour  Intake 592 ml  Output --  Net 592 ml        Physical Exam: Vital Signs Blood pressure 121/60, pulse 98, temperature 98.1 F (36.7 C), temperature source Oral, resp. rate 16, height _0  (1.727 m), weight 78.9 kg, SpO2 99 %.   General: No acute distress Mood and affect are appropriate Heart: Regular rate and rhythm no rubs murmurs or extra sounds Lungs: Clear to auscultation, breathing unlabored, no rales or wheezes Abdomen: Positive bowel sounds, soft nontender to palpation, nondistended Extremities: No clubbing, cyanosis, or edema Skin: see media images  10/25  10/22  10/17      Neuro: Alert HOH Motor: Motor: Bilateral upper extremities: 4/5 proximal distal,?  Full effort Bilateral lower extremities: Hip flexion: 2+/5, knee extension 3/5, ankle dorsiflexion 3/5   Assessment/Plan: 1. Functional deficits secondary to debility post urosepsis which require 3+ hours per day of interdisciplinary therapy in a comprehensive inpatient rehab setting.  Physiatrist is providing close team supervision and 24 hour management of active medical problems listed below.  Physiatrist and rehab team continue to assess barriers to discharge/monitor patient progress toward functional and medical goals  Care Tool:  Bathing    Body parts bathed by patient: Right arm, Left arm, Chest, Abdomen, Front perineal area, Buttocks, Right upper leg, Left upper leg, Right lower leg, Left lower leg, Face   Body parts bathed by helper: Buttocks, Right lower leg, Left lower  leg     Bathing assist Assist Level: Minimal Assistance - Patient > 75%     Upper Body Dressing/Undressing Upper body dressing   What is the patient wearing?: Pull over shirt    Upper body assist Assist Level: Set up assist    Lower Body Dressing/Undressing Lower body dressing      What is the patient wearing?: Pants, Incontinence brief     Lower body assist Assist for lower body dressing: Moderate Assistance - Patient 50 - 74%     Toileting Toileting Toileting Activity did not occur (Clothing management and hygiene only): N/A (no void or bm)  Toileting assist Assist for toileting: Moderate Assistance - Patient 50 - 74%     Transfers Chair/bed transfer  Transfers assist     Chair/bed transfer assist level: Minimal Assistance - Patient > 75% Chair/bed transfer assistive device: Programmer, multimedia   Ambulation assist   Ambulation activity did not occur: Safety/medical concerns  Assist level: Minimal Assistance - Patient > 75% Assistive device: Walker-rolling Max distance: 2f   Walk 10 feet activity   Assist  Walk 10 feet activity did not occur: Safety/medical concerns  Assist level: Minimal Assistance - Patient > 75% Assistive device: Walker-rolling   Walk 50 feet activity   Assist Walk 50 feet with 2 turns activity did not occur: Safety/medical concerns         Walk 150 feet activity   Assist Walk 150 feet activity did not occur: Safety/medical concerns         Walk 10 feet on uneven surface  activity   Assist Walk  10 feet on uneven surfaces activity did not occur: Safety/medical concerns         Wheelchair     Assist Will patient use wheelchair at discharge?: Yes (Per PT long term goals ) Type of Wheelchair: Manual Wheelchair activity did not occur: Safety/medical concerns  Wheelchair assist level: Minimal Assistance - Patient > 75% Max wheelchair distance: 120    Wheelchair 50 feet with 2 turns  activity    Assist    Wheelchair 50 feet with 2 turns activity did not occur: Safety/medical concerns   Assist Level: Minimal Assistance - Patient > 75%   Wheelchair 150 feet activity     Assist  Wheelchair 150 feet activity did not occur: Safety/medical concerns   Assist Level: Minimal Assistance - Patient > 75%    Medical Problem List and Plan: 1.  Altered mental status with decreased functional mobility secondary to sepsis/encephalopathy/embolic cerebral infarcts              Continue CIR PT, OT,  2.  Antithrombotics: -DVT/anticoagulation: Subcutaneous heparin             -antiplatelet therapy: Aspirin 81 mg daily 3. Pain Management: Tylenol as needed.   Controlled with meds on 10/25 4. Mood: Lexapro 20 mg daily             -antipsychotic agents: N/A 5. Neuropsych: This patient is capable of making decisions on her own behalf. 6. Skin/Wound Care: Routine skin checks, add xeroform telfa and kerlix Left foot   Peripheral vasc disease vs small veseel embolic infarcts related to sepsis  Cyanotic left toes #1,2,3, and R #1, plan for VVS is to monitor  Demarcation and f/u as OP   10/18 post op sandal, continue local care  Stable on 10/24 7. Fluids/Electrolytes/Nutrition: Routine in and outs. 8.  Pulmonary sarcoidosis.  Patient on 2 L nasal cannula baseline.  Follow-up outpatient  Continue supplemental oxygen 9.  Hypertension.  Monitor with increased mobility. Well controlled.  Vitals:   08/10/20 0827 08/10/20 0833  BP:    Pulse: 98   Resp: 16   Temp:    SpO2: 99% 99%   Controlled on 10/24 10.  Asthma.  Continue inhalers as directed 11.  Hyperlipidemia.  Lipitor 12.  Hypothyroidism.  Synthroid 13.  AKI.    Creatinine 1.02 on 10/13, labs ordered for tomorrow 14.  Bilateral lower extremity ischemia.  No urgent indication for lower extremity angiogram at present.  Follow-up vascular surgery. 15.  Urinary retention.Resolved, voiding after foley removal  16.   Leukocytosis associated with bacteremia - resolved  17.  Constipation  Improving 18.  Acute blood loss anemia  Hemoglobin 10.1 on 10/18, labs ordered for tomorrow 19.  Thrombocytopenia  Platelets 130 on 10/18, labs ordered for tomorrow 20.  Steroid-induced hyperglycemia  Labile on 10/24, monitor for trend  LOS: 13 days A FACE TO [place] E Stacie [name] 08/10/2020, 8:52 AM

## 2020-08-10 NOTE — Progress Notes (Signed)
Occupational Therapy Session Note  Patient Details  Name: Stacie [name] MRN: [id number] Date of Birth: Feb 17, 1956  Today's Date: 08/10/2020 OT Individual Time: 3662-9476 OT Individual Time Calculation (min): 45 min   OT Individual Time: 0945-1000 OT Individual Time Calculation (min): 15 min  Short Term Goals: Week 2:  OT Short Term Goal 1 (Week 2): Patient will compete 1/3 parts of LB dressing task with no more than Min A. OT Short Term Goal 2 (Week 2): Patient will complete toilet transfer with LRAD and Min A. OT Short Term Goal 3 (Week 2): Patient will complete 3/3 parts of toileting task with Min A. OT Short Term Goal 4 (Week 2): Patient will complete 1 grooming task in standing without rest break or significant drop in SpO2.  Skilled Therapeutic Interventions/Progress Updates:   Session 1: Patient lying supine in bed in agreement with OT treatment session. 0/10 pain at rest and with activity. Patient with other disciplines from [phone] missing 15 minutes of skilled OT treatment session. Supine to EOB with supervision A. Patient with desire to bathe at shower level this date requiring supervision A with RW for walk-in shower transfer. Patient doffed UB clothing in sitting/standing with CGA progressing to supervision A. UB bathing/dressing with set-up assist. Patient able to bathe BLE with exception of left lower leg covered by bag to keep dressings dry. Seated EOB, patient able to thread BLE through LB clothing with increased time 2/2 fatigue and SOB with use of AE PRN. CGA with slight posterior LOB with ability to self-correct while hiking LB clothing over hips in standing. Session concluded with patient lying supine in bed with call bell within reach, MD present at bedside, and all needs met.   Session 2: Patient in agreement with additional OT therapy session this a.m. Functional mobility from EOB to sink surface with close supervision A. Patient able to complete hair brushing and  oral hygiene in standing with and without unilateral UE support on RW. Noted continued SOB. Patient able to utilize pursed lip breathing strategies with minimal cueing during seated rest break. Session concluded with patient seated in wc with call bell within reach and all needs met.   Therapy Documentation Precautions:  Precautions Precautions: Fall Precaution Comments: monitor O2, 2L continuous Restrictions Weight Bearing Restrictions: No General:    Therapy/Group: Individual Therapy  Oluwademilade Kellett R Howerton-Davis 08/10/2020, 7:28 AM

## 2020-08-10 NOTE — Progress Notes (Signed)
Physical Therapy Session Note  Patient Details  Name: Stacie [name] MRN: [id number] Date of Birth: [birth year]  Today's Date: 08/10/2020 PT Individual Time: 1305-1405 PT Individual Time Calculation (min): 60 min   Short Term Goals: Week 2:  PT Short Term Goal 1 (Week 2): Pt will transfer to and from [facility] with min assist PT Short Term Goal 2 (Week 2): Pt wil ambulate 19f with RW and min assist PT Short Term Goal 3 (Week 2): Pt will ascend 4 steps with BUE support on mod assist  Skilled Therapeutic Interventions/Progress Updates: Pt presented in wc agreeable to therapy.Pt denies pain during session. Session focused on endurance and BLE strengthening. Pt transported to rehab gym for energy conservation. Participated in ambulation [id number] RW and CGA fading to close S. Pt then participated in standing therex sitting between each activity and performed STS with supervision from w/c to stand. PT performed hip abd/add, hip flexion, hamstring curls, hip extension between 10-15 reps each bilaterally. Then transferred to stairs and participated in step ups at 3in step x 10 bilaterally. Pt indicated felt heart "pounding" HR checked 122 with minimal resolution after 2 min. Pt then indicated need for urinary void. Pt then transported back to room and performed ambualtory transfer to toilet with close S and PTA managing O2 tank. Toilet transfers performed with supervision and peri-care performed mod I. Pt then indicated would like to return to bed this ambulatory transfer performed with RW to bed supervision overall. Performed sit to supine to flat bed with supervision and doffed surgical boots mod I in bed. Pt left in bed with alarm on, call bell within reach and needs met.      Therapy Documentation Precautions:  Precautions Precautions: Fall Precaution Comments: monitor O2, 2L continuous Restrictions Weight Bearing Restrictions: No General:   Vital Signs: Therapy Vitals Temp: 98 F (36.7 C) Temp  Source: Oral Pulse Rate: 99 Resp: 19 BP: 109/66 Patient Position (if appropriate): Lying Oxygen Therapy SpO2: 99 % O2 Device: Room Air Pain:   Mobility:   Locomotion :    Trunk/Postural Assessment :    Balance:   Exercises:   Other Treatments:      Therapy/Group: Individual Therapy  Sonal Dorwart 08/10/2020, 4:17 PM

## 2020-08-10 NOTE — Progress Notes (Signed)
Occupational Therapy Session Note  Patient Details  Name: Stacie [name] MRN: [id number] Date of Birth: 1956/01/07  Today's Date: 08/10/2020 OT Individual Time: 1050-1200 OT Individual Time Calculation (min): 70 min   Short Term Goals: Week 2:  OT Short Term Goal 1 (Week 2): Patient will compete 1/3 parts of LB dressing task with no more than Min A. OT Short Term Goal 2 (Week 2): Patient will complete toilet transfer with LRAD and Min A. OT Short Term Goal 3 (Week 2): Patient will complete 3/3 parts of toileting task with Min A. OT Short Term Goal 4 (Week 2): Patient will complete 1 grooming task in standing without rest break or significant drop in SpO2.  Skilled Therapeutic Interventions/Progress Updates:    Pt greeted in the w/c with no c/o pain. ADL needs were met and she wanted to work on UB strengthening. Once we donned her [employer] shoes, escorted pt to the outdoor patio and guided her through B UE therapeutic exercises using the green tband 10 reps 2 sets, also instructed pt through gentle UB stretches. She then ambulated in the atrium using RW with CGA (Mod A for initial sit<stands), vcs for forward gaze and mindful breathing during ambulation. During seated rest, we engaged in diaphragmatic breathing exercises together due to pts SOB after bouts of ambulation. 02 sats throughout session 91-99% on 2L via portable 02. She was then escorted back to the room and remained sitting up in the w/c with all needs within reach and safety belt fastened, hooked back up to room 02, on 2L.   Therapy Documentation Precautions:  Precautions Precautions: Fall Precaution Comments: monitor O2, 2L continuous Restrictions Weight Bearing Restrictions: No Vital Signs: Therapy Vitals Pulse Rate: 98 Resp: 16 Patient Position (if appropriate): Lying Oxygen Therapy SpO2: 99 % O2 Device: Nasal Cannula O2 Flow Rate (L/min): 2 L/min FiO2 (%): 28 % ADL: ADL Upper Body Bathing: Moderate assistance  Where Assessed-Upper Body Bathing: Edge of bed Lower Body Bathing: Maximal assistance Where Assessed-Lower Body Bathing: Edge of bed Upper Body Dressing: Moderate assistance Where Assessed-Upper Body Dressing: Edge of bed Lower Body Dressing: Dependent Where Assessed-Lower Body Dressing: Bed level ADL Comments: bed level ADLs at eval d/t decreased endurance/tolerance      Therapy/Group: Individual Therapy   A  08/10/2020, 12:22 PM

## 2020-08-11 ENCOUNTER — Inpatient Hospital Stay (HOSPITAL_COMMUNITY): Payer: BC Managed Care – PPO | Admitting: Physical Therapy

## 2020-08-11 ENCOUNTER — Inpatient Hospital Stay (HOSPITAL_COMMUNITY): Payer: BC Managed Care – PPO | Admitting: Occupational Therapy

## 2020-08-11 ENCOUNTER — Encounter: Payer: BC Managed Care – PPO | Admitting: Internal Medicine

## 2020-08-11 LAB — GLUCOSE, CAPILLARY
Glucose-Capillary: 92 mg/dL (ref 70–99)
Glucose-Capillary: 177 mg/dL — ABNORMAL HIGH (ref 70–99)
Glucose-Capillary: 139 mg/dL — ABNORMAL HIGH (ref 70–99)
Glucose-Capillary: 156 mg/dL — ABNORMAL HIGH (ref 70–99)

## 2020-08-11 NOTE — Progress Notes (Signed)
[facility] PHYSICAL MEDICINE & REHABILITATION PROGRESS NOTE   Subjective/Complaints:  No foot pain, discussed lab work ROS: Denies CP, SOB, N/V/D  Objective:   No results found. Recent Labs    08/10/20 0844  WBC 5.5  HGB 9.8*  HCT 31.7*  PLT 151   Recent Labs    08/10/20 0844  NA 142  K 4.0  CL 100  CO2 32  GLUCOSE 128*  BUN 26*  CREATININE 1.17*  CALCIUM 9.4    Intake/Output Summary (Last 24 hours) at 08/11/2020 0815 Last data filed at 08/11/2020 0700 Gross per 24 hour  Intake 640 ml  Output --  Net 640 ml        Physical Exam: Vital Signs Blood pressure (!) 129/59, pulse 94, temperature 98.1 F (36.7 C), temperature source Oral, resp. rate 19, height _0  (1.727 m), weight 78.7 kg, SpO2 100 %.   General: No acute distress Mood and affect are appropriate Heart: Regular rate and rhythm no rubs murmurs or extra sounds Lungs: Clear to auscultation, breathing unlabored, no rales or wheezes Abdomen: Positive bowel sounds, soft nontender to palpation, nondistended Extremities: No clubbing, cyanosis, or edema Skin: see media images  10/25  10/22  10/17      Neuro: Alert HOH Motor: Motor: Bilateral upper extremities: 4/5 proximal distal,?  Full effort Bilateral lower extremities: Hip flexion: 2+/5, knee extension 3/5, ankle dorsiflexion 3/5   Assessment/Plan: 1. Functional deficits secondary to debility post urosepsis which require 3+ hours per day of interdisciplinary therapy in a comprehensive inpatient rehab setting.  Physiatrist is providing close team supervision and 24 hour management of active medical problems listed below.  Physiatrist and rehab team continue to assess barriers to discharge/monitor patient progress toward functional and medical goals  Care Tool:  Bathing    Body parts bathed by patient: Right arm, Left arm, Chest, Abdomen, Front perineal area, Buttocks, Right upper leg, Left upper leg, Right lower leg, Left lower  leg, Face   Body parts bathed by helper: Buttocks, Right lower leg, Left lower leg     Bathing assist Assist Level: Contact Guard/Touching assist     Upper Body Dressing/Undressing Upper body dressing   What is the patient wearing?: Pull over shirt    Upper body assist Assist Level: Set up assist    Lower Body Dressing/Undressing Lower body dressing      What is the patient wearing?: Pants, Underwear/pull up     Lower body assist Assist for lower body dressing: Contact Guard/Touching assist     Toileting Toileting Toileting Activity did not occur (Clothing management and hygiene only): N/A (no void or bm)  Toileting assist Assist for toileting: Moderate Assistance - Patient 50 - 74%     Transfers Chair/bed transfer  Transfers assist     Chair/bed transfer assist level: Minimal Assistance - Patient > 75% Chair/bed transfer assistive device: Programmer, multimedia   Ambulation assist   Ambulation activity did not occur: Safety/medical concerns  Assist level: Minimal Assistance - Patient > 75% Assistive device: Walker-rolling Max distance: 98f   Walk 10 feet activity   Assist  Walk 10 feet activity did not occur: Safety/medical concerns  Assist level: Minimal Assistance - Patient > 75% Assistive device: Walker-rolling   Walk 50 feet activity   Assist Walk 50 feet with 2 turns activity did not occur: Safety/medical concerns         Walk 150 feet activity   Assist Walk 150 feet activity did not  occur: Safety/medical concerns         Walk 10 feet on uneven surface  activity   Assist Walk 10 feet on uneven surfaces activity did not occur: Safety/medical concerns         Wheelchair     Assist Will patient use wheelchair at discharge?: Yes (Per PT long term goals ) Type of Wheelchair: Manual Wheelchair activity did not occur: Safety/medical concerns  Wheelchair assist level: Minimal Assistance - Patient > 75% Max  wheelchair distance: 120    Wheelchair 50 feet with 2 turns activity    Assist    Wheelchair 50 feet with 2 turns activity did not occur: Safety/medical concerns   Assist Level: Minimal Assistance - Patient > 75%   Wheelchair 150 feet activity     Assist  Wheelchair 150 feet activity did not occur: Safety/medical concerns   Assist Level: Minimal Assistance - Patient > 75%    Medical Problem List and Plan: 1.  Altered mental status with decreased functional mobility secondary to sepsis/encephalopathy/embolic cerebral infarcts              Continue CIR PT, OT, team conf in am  2.  Antithrombotics: -DVT/anticoagulation: Subcutaneous heparin             -antiplatelet therapy: Aspirin 81 mg daily 3. Pain Management: Tylenol as needed.   Controlled with meds on 10/25 4. Mood: Lexapro 20 mg daily             -antipsychotic agents: N/A 5. Neuropsych: This patient is capable of making decisions on her own behalf. 6. Skin/Wound Care: Routine skin checks, add xeroform telfa and kerlix Left foot   Peripheral vasc disease vs small veseel embolic infarcts related to sepsis  Cyanotic left toes #1,2,3, and R #1, plan for VVS is to monitor  Demarcation and f/u as OP   10/18 post op sandal, continue local care  Stable on 10/24 7. Fluids/Electrolytes/Nutrition: Routine in and outs. 8.  Pulmonary sarcoidosis.  Patient on 2 L nasal cannula baseline.  Follow-up outpatient  Continue supplemental oxygen 9.  Hypertension.  Monitor with increased mobility. Well controlled.  Vitals:   08/10/20 1948 08/11/20 0612  BP:  (!) 129/59  Pulse: 96 94  Resp: 20 19  Temp:  98.1 F (36.7 C)  SpO2: 99% 100%   Controlled on 10/26 10.  Asthma.  Continue inhalers as directed 11.  Hyperlipidemia.  Lipitor 12.  Hypothyroidism.  Synthroid 13.  AKI.    Creatinine 1.02 on 10/13, repeat 1.17 yesterday  14.  Bilateral lower extremity ischemia.  No urgent indication for lower extremity angiogram at  present.  Follow-up vascular surgery. 15.  Urinary retention.Resolved, voiding after foley removal  16.  Leukocytosis associated with bacteremia - resolved  17.  Constipation  Improving 18.  Acute blood loss anemia  Hemoglobin stable 19.  Thrombocytopenia  Platelets 130 on 10/18, resolved 150K yesterday  20.  Steroid-induced hyperglycemia  mild  LOS: 14 days A FACE TO FACE EVALUATION WAS PERFORMED  Charlett Blake 08/11/2020, 8:15 AM

## 2020-08-11 NOTE — Plan of Care (Signed)
  Problem: RH Bathing Goal: LTG Patient will bathe all body parts with assist levels (OT) Description: LTG: Patient will bathe all body parts with assist levels (OT) Flowsheets (Taken 08/11/2020 1508) LTG: Pt will perform bathing with assistance level/cueing: Supervision/Verbal cueing Note: Goal upgraded 2/2 patient progress   Problem: RH Dressing Goal: LTG Patient will perform upper body dressing (OT) Description: LTG Patient will perform upper body dressing with assist, with/without cues (OT). 08/11/2020 1507 by Turner Daniels R, OT Note: Goal upgraded 2/2 patient progress 08/11/2020 1505 by Howerton-Davis, Shaynah Hund R, OT Flowsheets (Taken 08/11/2020 1505) LTG: Pt will perform upper body dressing with assistance level of: Independent with assistive device Goal: LTG Patient will perform lower body dressing w/assist (OT) Description: LTG: Patient will perform lower body dressing with assist, with/without cues in positioning using equipment (OT) 08/11/2020 1507 by Howerton-Davis, Cathe Bilger R, OT Note: Goal upgraded 2/2 patient progress 08/11/2020 1505 by Howerton-Davis, [place], OT Flowsheets (Taken 08/11/2020 1505) LTG: Pt will perform lower body dressing with assistance level of: Independent with assistive device   Problem: RH Toileting Goal: LTG Patient will perform toileting task (3/3 steps) with assistance level (OT) Description: LTG: Patient will perform toileting task (3/3 steps) with assistance level (OT)  08/11/2020 1507 by Howerton-Davis, Shannie Kontos R, OT Note: Goal upgraded 2/2 patient progress 08/11/2020 1505 by Howerton-Davis, Camelle Henkels R, OT Flowsheets (Taken 08/11/2020 1505) LTG: Pt will perform toileting task (3/3 steps) with assistance level: Independent with assistive device

## 2020-08-11 NOTE — Progress Notes (Addendum)
Patient ID: Stacie [name], female   DOB: 1956/02/11, 64 y.o.   MRN: [id number]   Family education scheduled 10/28, 9-12

## 2020-08-11 NOTE — Progress Notes (Signed)
Physical Therapy Session Note  Patient Details  Name: Stacie [name] MRN: [id number] Date of Birth: 10/16/1956  Today's Date: 08/11/2020 PT Individual Time: 1053-1203 PT Individual Time Calculation (min): 70 min   Short Term Goals: Week 2:  PT Short Term Goal 1 (Week 2): Pt will transfer to and from [facility] with min assist PT Short Term Goal 2 (Week 2): Pt wil ambulate 70f with RW and min assist PT Short Term Goal 3 (Week 2): Pt will ascend 4 steps with BUE support on mod assist  Skilled Therapeutic Interventions/Progress Updates: Pt presented in w/c agreeable to therapy. Pt denies pain throughout session. Pt pleased with progress as ambulated without AD this am. Pt transported to rehab gym for energy conservation and participated in ascending/descending x 4 steps with B rails and CGA. Pt indicated 3/10 on mBorg with HR increased to 110s after x 4 steps. Pt then participated in ascending/descending x 8 steps with B rails and CGA with HR increasing to 123 but quickly resolving. Discussed home set up and pt indicated may have aerobic step and practiced getting onto bed stepping backwards onto aerobic with RW with supervision. Pt then transferred to day room and participated in WLincolndalewithout AD for standing tolerance/endurance with pt able to tolerate x 10 frames without seated rest and maintain SBA dynamic balance. Pt then propelled back to room supervision for general general conditioning. Pt left in w/c at end of session with seat alarm on, call bell within reach and needs met.      Therapy Documentation Precautions:  Precautions Precautions: Fall Precaution Comments: monitor O2, 2L continuous Restrictions Weight Bearing Restrictions: No General:   Vital Signs: Therapy Vitals Temp: 98.4 F (36.9 C) Pulse Rate: 90 Resp: 19 BP: (!) 126/59 Patient Position (if appropriate): Lying Oxygen Therapy SpO2: 100 % O2 Device: Room Air   Therapy/Group: Individual Therapy  Zoua Caporaso  Eyva Califano, PTA  08/11/2020, 4:15 PM

## 2020-08-11 NOTE — Plan of Care (Deleted)
  Problem: RH Dressing Goal: LTG Patient will perform upper body dressing (OT) Description: LTG Patient will perform upper body dressing with assist, with/without cues (OT). 08/11/2020 1507 by Turner Daniels R, OT Note: Goal upgraded 2/2 patient progress 08/11/2020 1505 by Howerton-Davis, Jamaal Bernasconi R, OT Flowsheets (Taken 08/11/2020 1505) LTG: Pt will perform upper body dressing with assistance level of: Independent with assistive device Goal: LTG Patient will perform lower body dressing w/assist (OT) Description: LTG: Patient will perform lower body dressing with assist, with/without cues in positioning using equipment (OT) 08/11/2020 1507 by Howerton-Davis, Dejai Schubach R, OT Note: Goal upgraded 2/2 patient progress 08/11/2020 1505 by Howerton-Davis, [place], OT Flowsheets (Taken 08/11/2020 1505) LTG: Pt will perform lower body dressing with assistance level of: Independent with assistive device   Problem: RH Toileting Goal: LTG Patient will perform toileting task (3/3 steps) with assistance level (OT) Description: LTG: Patient will perform toileting task (3/3 steps) with assistance level (OT)  08/11/2020 1507 by Howerton-Davis, Bathsheba Durrett R, OT Note: Goal upgraded 2/2 patient progress 08/11/2020 1505 by Howerton-Davis, Mozes Sagar R, OT Flowsheets (Taken 08/11/2020 1505) LTG: Pt will perform toileting task (3/3 steps) with assistance level: Independent with assistive device

## 2020-08-11 NOTE — Progress Notes (Signed)
Occupational Therapy Session Note  Patient Details  Name: Stacie [name] MRN: [id number] Date of Birth: 1956/02/04  Today's Date: 08/11/2020 OT Individual Time: 1404-1500 OT Individual Time Calculation (min): 56 min    Short Term Goals: Week 2:  OT Short Term Goal 1 (Week 2): Patient will compete 1/3 parts of LB dressing task with no more than Min A. OT Short Term Goal 2 (Week 2): Patient will complete toilet transfer with LRAD and Min A. OT Short Term Goal 3 (Week 2): Patient will complete 3/3 parts of toileting task with Min A. OT Short Term Goal 4 (Week 2): Patient will complete 1 grooming task in standing without rest break or significant drop in SpO2.  Skilled Therapeutic Interventions/Progress Updates:  Patient met lying supine in bed in agreement with OT treatment session. 0/10 pain at rest and with activity. Supine to EOB and sit to stand to RW with close supervision A. Wc mobility from room to ADL apartment. Discussion about d/c plan for bathing/dressing with patient opting to use walk-in shower in master bedroom. Patient notes that her husband is planning to install grab bars and a [place]. OT provided education on safety with shower transfers including hand placement and safety with RW. Blocked practice with simulated shower transfer with patient requiring Min to Mod A for sit to stand from shower chair. Patient notes fatigue in BLE from previous therapy sessions this date. Total A for return to room for time management and energy conservation. Return to supine with supervision A. Session concluded with patient lying supine in bed with call bell within reach, bed alarm activated, and all needs met.  Therapy Documentation Precautions:  Precautions Precautions: Fall Precaution Comments: monitor O2, 2L continuous Restrictions Weight Bearing Restrictions: No General:    Therapy/Group: Individual Therapy  Enya Bureau R Howerton-Davis 08/11/2020, 7:30 AM

## 2020-08-11 NOTE — Progress Notes (Signed)
Physical Therapy Session Note  Patient Details  Name: Stacie [name] MRN: [id number] Date of Birth: 03/08/1956  Today's Date: 08/11/2020 PT Individual Time: 0902-1000 PT Individual Time Calculation (min): 58 min   Short Term Goals: Week 1:  PT Short Term Goal 1 (Week 1): Pt will transfer to [facility] with mod assist of 1 PT Short Term Goal 1 - Progress (Week 1): Met PT Short Term Goal 2 (Week 1): Pt will propell WC 162f with supervision assist PT Short Term Goal 2 - Progress (Week 1): Met PT Short Term Goal 3 (Week 1): Pt will initiate gait training PT Short Term Goal 3 - Progress (Week 1): Met PT Short Term Goal 4 (Week 1): Pt will tolerate sitting in 1 hour between therapist PT Short Term Goal 4 - Progress (Week 1): Met Week 2:  PT Short Term Goal 1 (Week 2): [facility] Short Term Goal 2 (Week 2): Pt wil ambulate 756fwith RW and min assist PT Short Term Goal 3 (Week 2): Pt will ascend 4 steps with BUE support on mod assist :     Skilled Therapeutic Interventions/Progress Updates:    [facility] for [facility] and energy conservation. Pt directed in NuStep for 6 mins L3 without rest breaks, O2 monitored throughout consistently above 90% on O2 throughout al activity for improved tolerance to activity and BLE strengthening for functional mobility. Pt directed in gait training without AD for 6x10' with seated rest breaks at end of each distance, mod A first rep and improved to min A-CGA for rest with VC for technique and safety. Pt directed in x15 item retrieval in gym with gait training with Rolling walker to gather each item at various reaching distances and heights including from ground level from standing, min A -CGA grossly. Pt returned to [facility]aken back to room, for energy conservation, left in [facility]arm set, All needs in reach and in good condition. Call light in hand.    Therapy  Documentation Precautions:  Precautions Precautions: Fall Precaution Comments: monitor O2, 2L continuous Restrictions Weight Bearing Restrictions: No    Therapy/Group: Individual Therapy  HaJunie Panning0/26/2021, 12:05 PM

## 2020-08-12 ENCOUNTER — Inpatient Hospital Stay (HOSPITAL_COMMUNITY): Payer: BC Managed Care – PPO | Admitting: Physical Therapy

## 2020-08-12 ENCOUNTER — Inpatient Hospital Stay (HOSPITAL_COMMUNITY): Payer: BC Managed Care – PPO

## 2020-08-12 LAB — GLUCOSE, CAPILLARY
Glucose-Capillary: 97 mg/dL (ref 70–99)
Glucose-Capillary: 147 mg/dL — ABNORMAL HIGH (ref 70–99)
Glucose-Capillary: 141 mg/dL — ABNORMAL HIGH (ref 70–99)
Glucose-Capillary: 144 mg/dL — ABNORMAL HIGH (ref 70–99)

## 2020-08-12 NOTE — Progress Notes (Signed)
Patient ID: Stacie [name], female   DOB: [birth year], 64 y.o.   MRN: [id number]   Wheelchair and rolling walker ordered through [facility].  [place], [place]

## 2020-08-12 NOTE — Progress Notes (Signed)
Patient ID: Stacie [name], female   DOB: 12/13/1955, 63 y.o.   MRN: [id number] Team Conference Report to Patient/Family  Team Conference discussion was reviewed with the patient and caregiver, including goals, any changes in plan of care and target discharge date.  Patient and caregiver express understanding and are in agreement.  The patient has a target discharge date of 08/14/20.  Dyanne Iha 08/12/2020, 1:02 PM

## 2020-08-12 NOTE — Progress Notes (Signed)
Occupational Therapy Session Note  Patient Details  Name: QUINCI GAVIDIA MRN: [id number] Date of Birth: 02/17/1956  Today's Date: 08/12/2020 OT Individual Time: 1115-1200 OT Individual Time Calculation (min): 45 min    Short Term Goals: Week 2:  OT Short Term Goal 1 (Week 2): Patient will compete 1/3 parts of LB dressing task with no more than Min A. OT Short Term Goal 2 (Week 2): Patient will complete toilet transfer with LRAD and Min A. OT Short Term Goal 3 (Week 2): Patient will complete 3/3 parts of toileting task with Min A. OT Short Term Goal 4 (Week 2): Patient will complete 1 grooming task in standing without rest break or significant drop in SpO2.  Skilled Therapeutic Interventions/Progress Updates:    Pt received sitting in the w/c with no c/o pain. Pt declining ADLs d/t being cold but was agreeable to session in dayroom. Pt completed w/c propulsion, 100 ft with slow rate, several rest breaks required. Pt completed stand pivot transfer to the mat with (S). Pt on 2L O2 via [place]. RR 17 with labored breathing after transfer. Edu provided re proper breathing techniques, pt very familiar and using prior to admission. Pt completed blocked practice sit <> stands, graded with and without UE support and holding a 4lb dowel. Pt was able to complete 1-3 at a time with heavy fatigue. Vitals were as listed below between sets. Husband reporting pt's resting HR is normally around 100 bpm. She rebounded to ~110 bpm and >95% SpO2.  Pt was returned to her room and left sitting up in the w/c with all needs met.  93% and 116 bpm.  95% and 120 bpm 94% and 117 bpm   Therapy Documentation Precautions:  Precautions Precautions: Fall Precaution Comments: monitor O2, 2L continuous Restrictions Weight Bearing Restrictions: No  Therapy/Group: Individual Therapy  Curtis Sites 08/12/2020, 6:57 AM

## 2020-08-12 NOTE — Progress Notes (Signed)
Physical Therapy Session Note  Patient Details  Name: Stacie [name] MRN: [id number] Date of Birth: [birth year]  Today's Date: 08/12/2020 PT Individual Time: 1305-1400 PT Individual Time Calculation (min): 55 min   Short Term Goals: Week 2:  PT Short Term Goal 1 (Week 2): Pt will transfer to and from [facility] with min assist PT Short Term Goal 2 (Week 2): Pt wil ambulate 26f with RW and min assist PT Short Term Goal 3 (Week 2): Pt will ascend 4 steps with BUE support on mod assist  Skilled Therapeutic Interventions/Progress Updates:   Pt received sitting in WC and agreeable to PT. Pt propelled WC to day room with supervision assist from PT with improved turning control noted by PT.   Dynamic standing balance instructed by PT while standing on level surface and engaged in fine motor problem solving task of Jenga. Pt able to maintain balance x 10 minutes with 1-0 UE support and dist   Pt performed gait training with RW 2 x 1224fwith supervision assist. And min cues for AD management in turns. Pt noted to have blood on R great toe following second bout of gait training. Pt returned to room and RN notified.   Throughout session pt was maintained on 2L O2 and SpO2 >98% throughout session. Patient returned to room and left sitting in [facility] call bell in reach and all needs met.          Therapy Documentation Precautions:  Precautions Precautions: Fall Precaution Comments: monitor O2, 2L continuous Restrictions Weight Bearing Restrictions: No Vital Signs: Therapy Vitals Temp: 99.4 F (37.4 C) Pulse Rate: (!) 108 Resp: 18 BP: (!) 110/59 Patient Position (if appropriate): Sitting Oxygen Therapy SpO2: 100 % O2 Device: Room Air Pain: denies Therapy/Group: Individual Therapy  AuLorie Phenix0/27/2021, 2:18 PM

## 2020-08-12 NOTE — Discharge Summary (Signed)
Physician Discharge Summary  Patient ID: Stacie [name] MRN: [id number] DOB/AGE: 65-Mar-1957 65 y.o.  Admit date: 07/28/2020 Discharge date: 08/14/2020  Discharge Diagnoses:  Principal Problem:   Cerebrovascular accident (CVA) due to embolism of cerebral artery ([place]) Active Problems:   Sepsis ([place])   Thrombocytopenia ([place])   Acute blood loss anemia   Slow transit constipation   AKI (acute kidney injury) ([place])   Supplemental oxygen dependent   Labile blood glucose   Steroid-induced hyperglycemia Hypertension Pulmonary sarcoidosis Urinary retention Bilateral lower extremity ischemia  Discharged Condition: Stable  Significant Diagnostic Studies: EEG  Result Date: 07/16/2020 Stacie Goodell, MD     07/16/2020  5:23 PM ELECTROENCEPHALOGRAM REPORT Patient: Stacie [name]       Room #: IC11A EEG No. ID: [id number] Age: 64 y.o.        Sex: female Requesting Physician: Lanney Gins Report Date:  07/16/2020       Interpreting Physician: Stacie [name] History: Stacie [name] is an 64 y.o. female with altered mental status and right sided weakness Medications: Brovana, ASA, Rocephin, Colace, Insulin, Solumedrol, Tyvaso Conditions of Recording:  This is a 21 channel routine scalp EEG performed with bipolar and monopolar montages arranged in accordance to the international 10/20 system of electrode placement. One channel was dedicated to EKG recording. The patient is in the awake and uncooperative state. Description:  Artifact is prominent during the recording often obscuring the background rhythm. When able to be visualized the background is slow and poorly organized.   It consists of low voltage activity in the delta-theta continuum.  For the most [name] this activity is continuous.  There are some rare short periods of generalized attenuation noted.  There are also some rare periodic discharges of triphasic morphology noted.  No epileptiform activity is noted.  Hyperventilation and ntermittent  photic stimulation were not performed. IMPRESSION: This is an abnormal EEG secondary to general background slowing with rare triphasic waves noted.  This finding may be seen with a diffuse cerebral disturbance that is etiologically nonspecific, but may include a metabolic encephalopathy, among other possibilities.  No epileptiform activity was noted.  Stacie Goodell, MD Neurology [phone] 07/16/2020, 5:18 PM   CT ABDOMEN PELVIS WO CONTRAST  Result Date: 07/17/2020 CLINICAL DATA:  Leukocytosis.  History of ureteral stents. EXAM: CT ABDOMEN AND PELVIS WITHOUT CONTRAST TECHNIQUE: Multidetector CT imaging of the abdomen and pelvis was performed following the standard protocol without IV contrast. COMPARISON:  July 14, 2020. FINDINGS: Lower chest: Stable chronic findings seen consistent with history of sarcoidosis. Hepatobiliary: No focal liver abnormality is seen. No gallstones, gallbladder wall thickening, or biliary dilatation. Pancreas: Unremarkable. No pancreatic ductal dilatation or surrounding inflammatory changes. Spleen: Normal in size without focal abnormality. Adrenals/Urinary Tract: Adrenal glands appear normal. Small nonobstructive right renal calculi are noted. Bilateral ureteral stents are noted in grossly good position. No hydronephrosis is noted. Foley catheter is noted within urinary bladder. Stomach/Bowel: Stomach is within normal limits. Appendix appears normal. No evidence of bowel wall thickening, distention, or inflammatory changes. Vascular/Lymphatic: Aortic atherosclerosis. No enlarged abdominal or pelvic lymph nodes. Reproductive: Status post hysterectomy. No adnexal masses. Other: No abdominal wall hernia or abnormality. No abdominopelvic ascites. Musculoskeletal: No acute or significant osseous findings. IMPRESSION: 1. Small nonobstructive right renal calculi. Bilateral ureteral stents are noted in grossly good position. No hydronephrosis is noted. 2. Stable chronic findings seen  in visualized lung bases consistent with history of sarcoidosis. 3. Aortic atherosclerosis. Aortic Atherosclerosis (ICD10-I70.0). Electronically Signed   By: Jeneen Rinks  Murlean Caller M.D.   On: 07/17/2020 12:28   CT ANGIO HEAD W OR WO CONTRAST  Result Date: 07/16/2020 CLINICAL DATA:  Stroke suspected.  Unable to move right side. EXAM: CT ANGIOGRAPHY HEAD AND NECK CT PERFUSION BRAIN TECHNIQUE: Multidetector CT imaging of the head and neck was performed using the standard protocol during bolus administration of intravenous contrast. Multiplanar CT image reconstructions and MIPs were obtained to evaluate the vascular anatomy. Carotid stenosis measurements (when applicable) are obtained utilizing NASCET criteria, using the distal internal carotid diameter as the denominator. Multiphase CT imaging of the brain was performed following IV bolus contrast injection. Subsequent parametric perfusion maps were calculated using RAPID software. CONTRAST:  132m OMNIPAQUE IOHEXOL 350 MG/ML SOLN COMPARISON:  CT head 07/10/2020 FINDINGS: CT HEAD FINDINGS Brain: Evaluation is limited by patient motion. There is no definite evidence of acute large vascular territory infarct. There is similar extensive patchy white matter hypoattenuation, most likely the sequela of chronic microvascular ischemic disease. No acute hemorrhage. No abnormal mass effect or mass lesion. No hydrocephalus. Vascular: No hyperdense vessel identified. Calcific atherosclerosis. Skull: No evidence of acute fracture. Sinuses/Orbits: Limit evaluation without evidence of fracture or focal lesion. Other: None. ASPECTS ([facility] Score) Total score (0-10 with 10 being normal): 10 Review of the MIP images confirms the above findings CTA NECK FINDINGS Nondiagnostic arterial evaluation in the upper neck/skull base, including the internal carotid arteries in the neck and mid to distal vertebral arteries. Aortic arch: Calcified atherosclerosis without  evidence of significant stenosis or aneurysm. Right carotid system: The visualized common carotid artery is within normal limits without significant stenosis or occlusion. Nondiagnostic evaluation of the distal common carotid artery and internal carotid artery to the level of the petrous carotid. Left carotid system: Calcified and noncalcified atherosclerosis of the common carotid artery without evidence of significant stenosis. Nondiagnostic evaluation of the distal common carotid artery and internal carotid artery to the level of the petrous carotid. Vertebral arteries: Approximately, no significant stenosis or occlusion. Nondiagnostic evaluation of the mid to distal vertebral arteries. Skeleton: Multilevel severe degenerative change. Other neck: No evidence of mass or adenopathy. Upper chest: Partially imaged fibrotic lung changes, possibly related to the patient's history of sarcoidosis. Review of the MIP images confirms the above findings CTA HEAD FINDINGS Anterior circulation: Calcific atherosclerosis of bilateral cavernous carotids without evidence of greater than 50% narrowing. Bilateral M1 MCAs and proximal M2 MCA is are patent without evidence of significant stenosis. Distal MCA branches appear relatively symmetric. The right A1 ACA is hypoplastic with a large left A1 ACA, likely anatomic variation. Posterior circulation: No significant stenosis or occlusion of the distal V4 vertebral arteries and basilar artery. Fetal type right PCA. No significant stenosis identified of the posterior cerebral arteries. No aneurysm. Venous sinuses: As permitted by contrast timing, [email or web address] Review of the MIP images confirms the above findings CT Brain Perfusion Findings: ASPECTS: 10 CBF (<30%) Volume: 019mPerfusion (Tmax>6.0s) volume: 79m73mismatch Volume: 79mL57mlculated T-max greater than 6 sec and mismatch is favored artifactual given location in a region of streak/motion and non vascular territory. IMPRESSION: 1.  Overall limited evaluation given patient motion with nondiagnostic arterial evaluation in the upper neck/skull base, including the internal carotid arteries and mid to distal vertebral arteries in the neck. Within this limitation, no evidence of acute intracranial abnormality, large vessel occlusion or significant (greater than 50%) arterial stenosis. Repeat CTA neck could further evaluate if clinically indicated. 2. No convincing penumbra. Small area of  reported mismatch is favored artifactual. 3. Partially imaged fibrotic lung changes, possibly related to the patient's reported history of sarcoidosis. Code stroke imaging results were communicated on 07/16/2020 at 11:58 am to provider Dr. Doy Mince via telephone, who verbally acknowledged these results. Electronically Signed   By: Margaretha Sheffield MD   On: 07/16/2020 12:17   DG Abd 1 View  Result Date: 07/15/2020 CLINICAL DATA:  Abdominal distension. EXAM: ABDOMEN - 1 VIEW COMPARISON:  November 18, 2014. FINDINGS: The bowel gas pattern is normal. Bilateral ureteral stents are noted. Surgical clips are seen overlying the sacrum. No radio-opaque calculi or other significant radiographic abnormality are seen. IMPRESSION: No evidence of bowel obstruction or ileus. Electronically Signed   By: Marijo Conception M.D.   On: 07/15/2020 08:28   CT ANGIO NECK W OR WO CONTRAST  Result Date: 07/16/2020 CLINICAL DATA:  Stroke suspected.  Unable to move right side. EXAM: CT ANGIOGRAPHY HEAD AND NECK CT PERFUSION BRAIN TECHNIQUE: Multidetector CT imaging of the head and neck was performed using the standard protocol during bolus administration of intravenous contrast. Multiplanar CT image reconstructions and MIPs were obtained to evaluate the vascular anatomy. Carotid stenosis measurements (when applicable) are obtained utilizing NASCET criteria, using the distal internal carotid diameter as the denominator. Multiphase CT imaging of the brain was performed following IV bolus  contrast injection. Subsequent parametric perfusion maps were calculated using RAPID software. CONTRAST:  141m OMNIPAQUE IOHEXOL 350 MG/ML SOLN COMPARISON:  CT head 07/10/2020 FINDINGS: CT HEAD FINDINGS Brain: Evaluation is limited by patient motion. There is no definite evidence of acute large vascular territory infarct. There is similar extensive patchy white matter hypoattenuation, most likely the sequela of chronic microvascular ischemic disease. No acute hemorrhage. No abnormal mass effect or mass lesion. No hydrocephalus. Vascular: No hyperdense vessel identified. Calcific atherosclerosis. Skull: No evidence of acute fracture. Sinuses/Orbits: Limit evaluation without evidence of fracture or focal lesion. Other: None. ASPECTS ([facility] Early CT Score) Total score (0-10 with 10 being normal): 10 Review of the MIP images confirms the above findings CTA NECK FINDINGS Nondiagnostic arterial evaluation in the upper neck/skull base, including the internal carotid arteries in the neck and mid to distal vertebral arteries. Aortic arch: Calcified atherosclerosis without evidence of significant stenosis or aneurysm. Right carotid system: The visualized common carotid artery is within normal limits without significant stenosis or occlusion. Nondiagnostic evaluation of the distal common carotid artery and internal carotid artery to the level of the petrous carotid. Left carotid system: Calcified and noncalcified atherosclerosis of the common carotid artery without evidence of significant stenosis. Nondiagnostic evaluation of the distal common carotid artery and internal carotid artery to the level of the petrous carotid. Vertebral arteries: Approximately, no significant stenosis or occlusion. Nondiagnostic evaluation of the mid to distal vertebral arteries. Skeleton: Multilevel severe degenerative change. Other neck: No evidence of mass or adenopathy. Upper chest: Partially imaged fibrotic lung changes,  possibly related to the patient's history of sarcoidosis. Review of the MIP images confirms the above findings CTA HEAD FINDINGS Anterior circulation: Calcific atherosclerosis of bilateral cavernous carotids without evidence of greater than 50% narrowing. Bilateral M1 MCAs and proximal M2 MCA is are patent without evidence of significant stenosis. Distal MCA branches appear relatively symmetric. The right A1 ACA is hypoplastic with a large left A1 ACA, likely anatomic variation. Posterior circulation: No significant stenosis or occlusion of the distal V4 vertebral arteries and basilar artery. Fetal type right PCA. No significant stenosis identified of the posterior cerebral  arteries. No aneurysm. Venous sinuses: As permitted by contrast timing, [email or web address] Review of the MIP images confirms the above findings CT Brain Perfusion Findings: ASPECTS: 10 CBF (<30%) Volume: 58m Perfusion (Tmax>6.0s) volume: 665mMismatch Volume: 2m39malculated T-max greater than 6 sec and mismatch is favored artifactual given location in a region of streak/motion and non vascular territory. IMPRESSION: 1. Overall limited evaluation given patient motion with nondiagnostic arterial evaluation in the upper neck/skull base, including the internal carotid arteries and mid to distal vertebral arteries in the neck. Within this limitation, no evidence of acute intracranial abnormality, large vessel occlusion or significant (greater than 50%) arterial stenosis. Repeat CTA neck could further evaluate if clinically indicated. 2. No convincing penumbra. Small area of reported mismatch is favored artifactual. 3. Partially imaged fibrotic lung changes, possibly related to the patient's reported history of sarcoidosis. Code stroke imaging results were communicated on 07/16/2020 at 11:58 am to provider Dr. ReyDoy Mincea telephone, who verbally acknowledged these results. Electronically Signed   By: FreMargaretha Sheffield   On: 07/16/2020 12:17   MR BRAIN WO  CONTRAST  Result Date: 07/21/2020 CLINICAL DATA:  64 29ar old female code stroke presentation on 07/16/2020. Several motion degraded exams, but diagnostic MRI yesterday suggesting a few punctate foci of white matter restricted diffusion such as due to small acute or subacute embolic infarcts. Repeat DWI, FLAIR and SWI imaging requested today. EXAM: MRI HEAD WITHOUT CONTRAST TECHNIQUE: Multiplanar, multiecho pulse sequences of the brain and surrounding structures were obtained without intravenous contrast. COMPARISON:  Brain MRI 07/20/2020 and earlier. FINDINGS: Study is intermittently degraded by motion artifact despite repeated imaging attempts. Brain: No intracranial mass effect or ventriculomegaly. Stable cerebral morphology since yesterday. Widespread, confluent cerebral white matter T2 and FLAIR hyperintensity again noted. Similar moderate T2 and FLAIR heterogeneity in the pons. Repeat DWI imaging is of slightly lesser quality than the yesterday. There remain only small vague areas of abnormal trace diffusion (such as in the right corona radiata series 5, image 29 and right parietal subcortical white matter on image 30). No new or progressive diffusion abnormality. SWI imaging is compared to T2 * imaging yesterday. No evidence of acute or chronic intracranial blood products. Vascular: Major intracranial vascular flow voids appear stable. Other: Stable mild mastoid effusion. Visible nasopharynx remains negative. IMPRESSION: 1. Unchanged DWI from yesterday with small, vague foci of trace diffusion abnormality suggesting scattered small foci of subacute white matter ischemia. Underlying advanced but nonspecific chronic signal changes in the cerebral white matter and the pons redemonstrated, most commonly due to small vessel disease. 2. Negative SWI imaging. 3. No new intracranial abnormality is evident. Electronically Signed   By: H  Genevie AnnD.   On: 07/21/2020 15:26   MR BRAIN WO CONTRAST  Result Date:  07/20/2020 CLINICAL DATA:  Stroke follow-up. EXAM: MRI HEAD WITHOUT CONTRAST TECHNIQUE: Multiplanar, multiecho pulse sequences of the brain and surrounding structures were obtained without intravenous contrast. COMPARISON:  MRI of the brain July 16, 2020. FINDINGS: The study is partially degraded by motion. Brain: A few punctate foci of restricted diffusion/T2 hyperintensity are seen within the white matter of the bilateral frontal lobes, suggesting acute/subacute infarcts. No hemorrhage, hydrocephalus, extra-axial collection or mass lesion. Scattered and confluent foci of T2 hyperintensity are seen within the white matter of the cerebral hemispheres and within the pons, nonspecific, most likely related to small vessel ischemia. Vascular: Normal flow voids. Skull and upper cervical spine: Normal marrow signal. Sinuses/Orbits: Bilateral lens surgery.  Paranasal sinuses are clear Other: Left  mastoid effusion. IMPRESSION: 1. A few punctate foci of restricted diffusion/T2 hyperintensity within the white matter of the bilateral frontal lobes, suggesting acute/subacute infarcts, likely embolic. 2. Moderate to advanced white matter disease, likely chronic microangiopathy. 3. Left mastoid effusion. Electronically Signed   By: Pedro Earls M.D.   On: 07/20/2020 13:19   MR BRAIN W WO CONTRAST  Result Date: 07/16/2020 CLINICAL DATA:  Neuro deficit, acute, stroke suspected. EXAM: MRI HEAD WITHOUT AND WITH CONTRAST TECHNIQUE: Multiplanar, multiecho pulse sequences of the brain and surrounding structures were obtained without and with intravenous contrast. CONTRAST:  23m GADAVIST GADOBUTROL 1 MMOL/ML IV SOLN COMPARISON:  Same day CT imaging FINDINGS: Brain: Severely limited evaluation with multiple nondiagnostic sequences. Diffusion-weighted imaging is significantly limited with multiple small areas of DWI hyperintensity in the white matter. Patchy bilateral white matter T2/FLAIR hyperintensities, likely  the sequela of chronic microvascular ischemic disease. Mild diffuse cerebral volume loss with ex vacuo ventricular dilation. No hydrocephalus. No large hemorrhage, mass lesion, or significant mass effect. Vascular: Better evaluated on same-day CT code stroke. Flow voids are grossly maintained at the skull base. Skull and upper cervical spine: Not well evaluated Sinuses/Orbits: No substantial paranasal sinus disease. No acute orbital abnormality. Other: Small left mastoid effusion. IMPRESSION: Severely limited evaluation with multiple nondiagnostic sequences. Diffusion-weighted imaging is significantly limited with multiple small areas of DWI hyperintensity in the bilateral white matter representing artifact versus small infarcts. Repeat MRI (possibly with sedation) could further evaluate if clinically indicated. Electronically Signed   By: FMargaretha SheffieldMD   On: 07/16/2020 12:46   [place]oreaCarotid Bilateral  Result Date: 07/19/2020 CLINICAL DATA:  64year old female with stroke EXAM: BILATERAL CAROTID DUPLEX ULTRASOUND TECHNIQUE: GPearline Cablesscale imaging, color Doppler and duplex ultrasound were performed of bilateral carotid and vertebral arteries in the neck. COMPARISON:  None. FINDINGS: Criteria: Quantification of carotid stenosis is based on velocity parameters that correlate the residual internal carotid diameter with NASCET-based stenosis levels, using the diameter of the distal internal carotid lumen as the denominator for stenosis measurement. The following velocity measurements were obtained: RIGHT ICA:  Systolic 82 cm/sec, Diastolic 22 cm/sec CCA:  56 cm/sec SYSTOLIC ICA/CCA RATIO:  1.5 ECA:  80 cm/sec LEFT ICA:  Systolic 1735cm/sec, Diastolic 31 cm/sec CCA:  69 cm/sec SYSTOLIC ICA/CCA RATIO:  1.5 ECA:  67 cm/sec Right Brachial SBP: Not acquired Left Brachial SBP: Not acquired RIGHT CAROTID ARTERY: No significant calcifications of the right common carotid artery. Intermediate waveform maintained. Heterogeneous  and partially calcified plaque at the right carotid bifurcation. No significant lumen shadowing. Low resistance waveform of the right ICA. No significant tortuosity. RIGHT VERTEBRAL ARTERY: Antegrade flow with low resistance waveform. LEFT CAROTID ARTERY: No significant calcifications of the left common carotid artery. Intermediate waveform maintained. Heterogeneous and partially calcified plaque at the left carotid bifurcation without significant lumen shadowing. Low resistance waveform of the left ICA. No significant tortuosity. LEFT VERTEBRAL ARTERY:  Antegrade flow with low resistance waveform. IMPRESSION: Color duplex indicates minimal heterogeneous and calcified plaque, with no hemodynamically significant stenosis by duplex criteria in the extracranial cerebrovascular circulation. Signed, JDulcy Fanny WDellia Nims RPVI [facility]ospital [facility] School RdRadiology Electronically Signed   By: JCorrie MckusickD.O.   On: 07/19/2020 05:40   CT CEREBRAL PERFUSION W CONTRAST  Result Date: 07/16/2020 CLINICAL DATA:  Stroke suspected.  Unable to move right side. EXAM: CT ANGIOGRAPHY HEAD AND NECK CT PERFUSION BRAIN TECHNIQUE: Multidetector CT imaging of the head and neck was  performed using the standard protocol during bolus administration of intravenous contrast. Multiplanar CT image reconstructions and MIPs were obtained to evaluate the vascular anatomy. Carotid stenosis measurements (when applicable) are obtained utilizing NASCET criteria, using the distal internal carotid diameter as the denominator. Multiphase CT imaging of the brain was performed following IV bolus contrast injection. Subsequent parametric perfusion maps were calculated using RAPID software. CONTRAST:  118m OMNIPAQUE IOHEXOL 350 MG/ML SOLN COMPARISON:  CT head 07/10/2020 FINDINGS: CT HEAD FINDINGS Brain: Evaluation is limited by patient motion. There is no definite evidence of acute large vascular territory infarct. There  is similar extensive patchy white matter hypoattenuation, most likely the sequela of chronic microvascular ischemic disease. No acute hemorrhage. No abnormal mass effect or mass lesion. No hydrocephalus. Vascular: No hyperdense vessel identified. Calcific atherosclerosis. Skull: No evidence of acute fracture. Sinuses/Orbits: Limit evaluation without evidence of fracture or focal lesion. Other: None. ASPECTS ([facility] CT Score) Total score (0-10 with 10 being normal): 10 Review of the MIP images confirms the above findings CTA NECK FINDINGS Nondiagnostic arterial evaluation in the upper neck/skull base, including the internal carotid arteries in the neck and mid to distal vertebral arteries. Aortic arch: Calcified atherosclerosis without evidence of significant stenosis or aneurysm. Right carotid system: The visualized common carotid artery is within normal limits without significant stenosis or occlusion. Nondiagnostic evaluation of the distal common carotid artery and internal carotid artery to the level of the petrous carotid. Left carotid system: Calcified and noncalcified atherosclerosis of the common carotid artery without evidence of significant stenosis. Nondiagnostic evaluation of the distal common carotid artery and internal carotid artery to the level of the petrous carotid. Vertebral arteries: Approximately, no significant stenosis or occlusion. Nondiagnostic evaluation of the mid to distal vertebral arteries. Skeleton: Multilevel severe degenerative change. Other neck: No evidence of mass or adenopathy. Upper chest: Partially imaged fibrotic lung changes, possibly related to the patient's history of sarcoidosis. Review of the MIP images confirms the above findings CTA HEAD FINDINGS Anterior circulation: Calcific atherosclerosis of bilateral cavernous carotids without evidence of greater than 50% narrowing. Bilateral M1 MCAs and proximal M2 MCA is are patent without evidence of  significant stenosis. Distal MCA branches appear relatively symmetric. The right A1 ACA is hypoplastic with a large left A1 ACA, likely anatomic variation. Posterior circulation: No significant stenosis or occlusion of the distal V4 vertebral arteries and basilar artery. Fetal type right PCA. No significant stenosis identified of the posterior cerebral arteries. No aneurysm. Venous sinuses: As permitted by contrast timing, [email or web address] Review of the MIP images confirms the above findings CT Brain Perfusion Findings: ASPECTS: 10 CBF (<30%) Volume: 024mPerfusion (Tmax>6.0s) volume: 34m61mismatch Volume: 34mL54mlculated T-max greater than 6 sec and mismatch is favored artifactual given location in a region of streak/motion and non vascular territory. IMPRESSION: 1. Overall limited evaluation given patient motion with nondiagnostic arterial evaluation in the upper neck/skull base, including the internal carotid arteries and mid to distal vertebral arteries in the neck. Within this limitation, no evidence of acute intracranial abnormality, large vessel occlusion or significant (greater than 50%) arterial stenosis. Repeat CTA neck could further evaluate if clinically indicated. 2. No convincing penumbra. Small area of reported mismatch is favored artifactual. 3. Partially imaged fibrotic lung changes, possibly related to the patient's reported history of sarcoidosis. Code stroke imaging results were communicated on 07/16/2020 at 11:58 am to provider Dr. ReynDoy Mince telephone, who verbally acknowledged these results. Electronically Signed   By: FredMargaretha Sheffield  On: 07/16/2020 12:17   DG Chest Port 1 View  Result Date: 07/20/2020 CLINICAL DATA:  Acute respiratory failure. EXAM: PORTABLE CHEST 1 VIEW COMPARISON:  07/14/2020 and CT chest 07/10/2020. FINDINGS: Patient is rotated. Trachea is midline. Heart is enlarged, stable. Upper lobe predominant coarsened pulmonary parenchymal markings with relatively low lung volumes.  Prominent epicardial fat versus small left pleural effusion. Thoracic vertebral body augmentations. IMPRESSION: 1. Pulmonary parenchymal findings of sarcoid without superimposed acute findings. 2. Prominent epicardial fat versus small left pleural effusion. Electronically Signed   By: Lorin Picket M.D.   On: 07/20/2020 07:47   ECHO TEE  Result Date: 07/28/2020    TRANSESOPHOGEAL ECHO REPORT   Patient Name:   Stacie [name] Date of Exam: 07/27/2020 Medical Rec #:  [id number]         Height:       67.5 in Accession #:    [id number]        Weight:       179.0 lb Date of Birth:  07-08-1956          BSA:          1.940 m Patient Age:    68 years          BP:           84/40 mmHg Patient Gender: F                 HR:           95 bpm. Exam Location:  [facility] Procedure: Transesophageal Echo, Cardiac Doppler and Color Doppler Indications:     Not listed on check-in order  History:         Patient has prior history of Echocardiogram examinations, most                  recent 07/16/2020. Signs/Symptoms:Murmur.  Sonographer:     Sherrie Sport RDCS (AE) Referring Phys:  [id number] Teodoro Spray Diagnosing Phys: Bartholome Bill MD PROCEDURE: The transesophogeal probe was passed without difficulty through the esophogus of the patient. Sedation performed by different physician. Patients was under conscious sedation during this procedure. The patient developed no complications during  the procedure. IMPRESSIONS  1. Left ventricular ejection fraction, by estimation, is 60 to 65%. The left ventricle has normal function. The left ventricle has no regional wall motion abnormalities.  2. Right ventricular systolic function was not well visualized. The right ventricular size is not well visualized.  3. La appendage not well visualized but la blood flow was brisk and no smoke noted. . No left atrial/left atrial appendage thrombus was detected.  4. Right atrial size was mildly dilated.  5. The mitral valve is grossly normal. Trivial mitral  valve regurgitation.  6. The aortic valve is tricuspid. Aortic valve regurgitation is not visualized. FINDINGS  Left Ventricle: Left ventricular ejection fraction, by estimation, is 60 to 65%. The left ventricle has normal function. The left ventricle has no regional wall motion abnormalities. The left ventricular internal cavity size was normal in size. Right Ventricle: The right ventricular size is not well visualized. Right vetricular wall thickness was not assessed. Right ventricular systolic function was not well visualized. Left Atrium: La appendage not well visualized but la blood flow was brisk and no smoke noted. Left atrial size was normal in size. No left atrial/left atrial appendage thrombus was detected. Right Atrium: Right atrial size was mildly dilated. Pericardium: There is no evidence of pericardial effusion. Mitral Valve: The mitral  valve is grossly normal. Trivial mitral valve regurgitation. There is no evidence of mitral valve vegetation. Tricuspid Valve: The tricuspid valve is grossly normal. Tricuspid valve regurgitation is trivial. There is no evidence of tricuspid valve vegetation. Aortic Valve: The aortic valve is tricuspid. Aortic valve regurgitation is not visualized. There is no evidence of aortic valve vegetation. Pulmonic Valve: The pulmonic valve was not well visualized. Pulmonic valve regurgitation is trivial. Aorta: The aortic root is normal in size and structure. IAS/Shunts: No atrial level shunt detected by color flow Doppler. Agitated saline contrast was given intravenously to evaluate for intracardiac shunting. Bartholome Bill MD Electronically signed by Bartholome Bill MD Signature Date/Time: 07/28/2020/4:14:51 PM    Final    ECHOCARDIOGRAM COMPLETE BUBBLE STUDY  Result Date: 07/17/2020    ECHOCARDIOGRAM REPORT   Patient Name:   Stacie [name] Date of Exam: 07/16/2020 Medical Rec #:  [id number]         Height:       67.0 in Accession #:    [id number]        Weight:       192.9 lb  Date of Birth:  06-Jun-1956          BSA:          1.991 m Patient Age:    41 years          BP:           120/72 mmHg Patient Gender: F                 HR:           99 bpm. Exam Location:  [facility] Procedure: 2D Echo, Cardiac Doppler, Color Doppler and Saline Contrast Bubble            Study Indications:     Stroke 434.91  History:         Patient has prior history of Echocardiogram examinations, most                  recent 07/13/2020. Pulmonary HTN, Signs/Symptoms:Murmur; Risk                  Factors:Sleep Apnea.  Sonographer:     Sherrie Sport RDCS (AE) Referring Phys:  [place] Diagnosing Phys: Yolonda Kida MD IMPRESSIONS  1. No evidence of thrombus.  2. Left ventricular ejection fraction, by estimation, is 35 to 40%. The left ventricle has mild to moderately decreased function. The left ventricle demonstrates global hypokinesis. Left ventricular diastolic parameters are consistent with Grade III diastolic dysfunction (restrictive).  3. Right ventricular systolic function is normal. The right ventricular size is normal.  4. The mitral valve is normal in structure. No evidence of mitral valve regurgitation.  5. The aortic valve is grossly normal. Aortic valve regurgitation is not visualized. Conclusion(s)/Recommendation(s): No evidence of valvular vegetations on this transthoracic echocardiogram. Would recommend a transesophageal echocardiogram to exclude infective endocarditis if clinically indicated. Unable to exclude left ventricular thrombus, would recommend a repeat transthoracic echocardiogram with contrast. FINDINGS  Left Ventricle: Left ventricular ejection fraction, by estimation, is 35 to 40%. The left ventricle has mild to moderately decreased function. The left ventricle demonstrates global hypokinesis. The left ventricular internal cavity size was normal in size. There is no left ventricular hypertrophy. Left ventricular diastolic parameters are consistent with Grade III diastolic  dysfunction (restrictive). Right Ventricle: The right ventricular size is normal. No increase in right ventricular wall thickness. Right ventricular systolic function is  normal. Left Atrium: Left atrial size was normal in size. Right Atrium: Right atrial size was normal in size. Pericardium: There is no evidence of pericardial effusion. Mitral Valve: The mitral valve is normal in structure. No evidence of mitral valve regurgitation. Tricuspid Valve: The tricuspid valve is normal in structure. Tricuspid valve regurgitation is not demonstrated. Aortic Valve: The aortic valve is grossly normal. Aortic valve regurgitation is not visualized. Aortic valve mean gradient measures 6.0 mmHg. Aortic valve peak gradient measures 10.6 mmHg. Aortic valve area, by VTI measures 1.72 cm. Pulmonic Valve: The pulmonic valve was normal in structure. Pulmonic valve regurgitation is not visualized. Aorta: The ascending aorta was not well visualized. IAS/Shunts: No atrial level shunt detected by color flow Doppler. Agitated saline contrast was given intravenously to evaluate for intracardiac shunting. Additional Comments: No evidence of thrombus.  LEFT VENTRICLE PLAX 2D LVIDd:         4.36 cm     Diastology LVIDs:         3.64 cm     LV e' medial:    4.90 cm/s LV PW:         1.30 cm     LV E/e' medial:  12.6 LV IVS:        1.31 cm     LV e' lateral:   3.92 cm/s LVOT diam:     2.00 cm     LV E/e' lateral: 15.7 LV SV:         52 LV SV Index:   26 LVOT Area:     3.14 cm  LV Volumes (MOD) LV vol d, MOD A2C: 73.5 ml LV vol d, MOD A4C: 73.9 ml LV vol s, MOD A2C: 43.2 ml LV vol s, MOD A4C: 47.6 ml LV SV MOD A2C:     30.3 ml LV SV MOD A4C:     73.9 ml LV SV MOD BP:      27.4 ml RIGHT VENTRICLE RV Basal diam:  2.71 cm RV S prime:     13.60 cm/s TAPSE (M-mode): 3.1 cm LEFT ATRIUM             Index       RIGHT ATRIUM           Index LA diam:        3.40 cm 1.71 cm/m  RA Area:     12.80 cm LA Vol (A2C):   34.7 ml 17.42 ml/m RA Volume:   27.80 ml   13.96 ml/m LA Vol (A4C):   28.7 ml 14.41 ml/m LA Biplane Vol: 34.0 ml 17.07 ml/m  AORTIC VALVE AV Area (Vmax):    1.99 cm AV Area (Vmean):   1.89 cm AV Area (VTI):     1.72 cm AV Vmax:           163.00 cm/s AV Vmean:          116.500 cm/s AV VTI:            0.301 m AV Peak Grad:      10.6 mmHg AV Mean Grad:      6.0 mmHg LVOT Vmax:         103.00 cm/s LVOT Vmean:        70.100 cm/s LVOT VTI:          0.165 m LVOT/AV VTI ratio: 0.55  AORTA Ao Root diam: 2.80 cm MITRAL VALVE                TRICUSPID  VALVE MV Area (PHT): 4.80 cm     TR Peak grad:   23.6 mmHg MV Decel Time: 158 msec     TR Vmax:        243.00 cm/s MV E velocity: 61.60 cm/s MV A velocity: 128.00 cm/s  SHUNTS MV E/A ratio:  0.48         Systemic VTI:  0.16 m                             Systemic Diam: 2.00 cm Yolonda Kida MD Electronically signed by Yolonda Kida MD Signature Date/Time: 07/17/2020/3:51:35 PM    Final    CT HEAD CODE STROKE WO CONTRAST  Result Date: 07/16/2020 CLINICAL DATA:  Stroke suspected.  Unable to move right side. EXAM: CT ANGIOGRAPHY HEAD AND NECK CT PERFUSION BRAIN TECHNIQUE: Multidetector CT imaging of the head and neck was performed using the standard protocol during bolus administration of intravenous contrast. Multiplanar CT image reconstructions and MIPs were obtained to evaluate the vascular anatomy. Carotid stenosis measurements (when applicable) are obtained utilizing NASCET criteria, using the distal internal carotid diameter as the denominator. Multiphase CT imaging of the brain was performed following IV bolus contrast injection. Subsequent parametric perfusion maps were calculated using RAPID software. CONTRAST:  111m OMNIPAQUE IOHEXOL 350 MG/ML SOLN COMPARISON:  CT head 07/10/2020 FINDINGS: CT HEAD FINDINGS Brain: Evaluation is limited by patient motion. There is no definite evidence of acute large vascular territory infarct. There is similar extensive patchy white matter hypoattenuation, most  likely the sequela of chronic microvascular ischemic disease. No acute hemorrhage. No abnormal mass effect or mass lesion. No hydrocephalus. Vascular: No hyperdense vessel identified. Calcific atherosclerosis. Skull: No evidence of acute fracture. Sinuses/Orbits: Limit evaluation without evidence of fracture or focal lesion. Other: None. ASPECTS ([facility] Score) Total score (0-10 with 10 being normal): 10 Review of the MIP images confirms the above findings CTA NECK FINDINGS Nondiagnostic arterial evaluation in the upper neck/skull base, including the internal carotid arteries in the neck and mid to distal vertebral arteries. Aortic arch: Calcified atherosclerosis without evidence of significant stenosis or aneurysm. Right carotid system: The visualized common carotid artery is within normal limits without significant stenosis or occlusion. Nondiagnostic evaluation of the distal common carotid artery and internal carotid artery to the level of the petrous carotid. Left carotid system: Calcified and noncalcified atherosclerosis of the common carotid artery without evidence of significant stenosis. Nondiagnostic evaluation of the distal common carotid artery and internal carotid artery to the level of the petrous carotid. Vertebral arteries: Approximately, no significant stenosis or occlusion. Nondiagnostic evaluation of the mid to distal vertebral arteries. Skeleton: Multilevel severe degenerative change. Other neck: No evidence of mass or adenopathy. Upper chest: Partially imaged fibrotic lung changes, possibly related to the patient's history of sarcoidosis. Review of the MIP images confirms the above findings CTA HEAD FINDINGS Anterior circulation: Calcific atherosclerosis of bilateral cavernous carotids without evidence of greater than 50% narrowing. Bilateral M1 MCAs and proximal M2 MCA is are patent without evidence of significant stenosis. Distal MCA branches appear relatively symmetric.  The right A1 ACA is hypoplastic with a large left A1 ACA, likely anatomic variation. Posterior circulation: No significant stenosis or occlusion of the distal V4 vertebral arteries and basilar artery. Fetal type right PCA. No significant stenosis identified of the posterior cerebral arteries. No aneurysm. Venous sinuses: As permitted by contrast timing, patent.ww Review of the MIP  images confirms the above findings CT Brain Perfusion Findings: ASPECTS: 10 CBF (<30%) Volume: 70m Perfusion (Tmax>6.0s) volume: 644mMismatch Volume: 60m20malculated T-max greater than 6 sec and mismatch is favored artifactual given location in a region of streak/motion and non vascular territory. IMPRESSION: 1. Overall limited evaluation given patient motion with nondiagnostic arterial evaluation in the upper neck/skull base, including the internal carotid arteries and mid to distal vertebral arteries in the neck. Within this limitation, no evidence of acute intracranial abnormality, large vessel occlusion or significant (greater than 50%) arterial stenosis. Repeat CTA neck could further evaluate if clinically indicated. 2. No convincing penumbra. Small area of reported mismatch is favored artifactual. 3. Partially imaged fibrotic lung changes, possibly related to the patient's reported history of sarcoidosis. Code stroke imaging results were communicated on 07/16/2020 at 11:58 am to provider Dr. ReyDoy Mincea telephone, who verbally acknowledged these results. Electronically Signed   By: FreMargaretha Sheffield   On: 07/16/2020 12:17    Labs:  Basic Metabolic Panel: Recent Labs  Lab 08/10/20 0844  NA 142  K 4.0  CL 100  CO2 32  GLUCOSE 128*  BUN 26*  CREATININE 1.17*  CALCIUM 9.4    CBC: Recent Labs  Lab 08/10/20 0844  WBC 5.5  NEUTROABS 3.7  HGB 9.8*  HCT 31.7*  MCV 103.3*  PLT 151    CBG: Recent Labs  Lab 08/12/20 2102 08/13/20 0603 08/13/20 1122 08/13/20 1728 08/13/20 2101  GLUCAP 144* 86 99 154* 132*    Family history.  Father with asthma and colon cancer Brother with asthma maternal grandmother with breast cancer.  Denies any esophageal cancer or rectal cancer  Brief HPI:   Stacie [name] a 64 16o. right-handed female with history of arrhythmia hypertension asthma hypothyroidism hard of hearing with hearing aids pulmonary sarcoidosis with 2 L nasal cannula at baseline maintained on methotrexate as well as chronic prednisone, OSA irritable bowel syndrome chronic kidney disease with creatinine 1.04-1.11.  Per chart review lives with spouse who is a locField seismologistIndependent with assistive device.  Presented 07/09/2020 with altered mental status shortness of breath.  Cranial CT scan showed chronic white matter ischemic changes without acute abnormality.  CT of the chest showed chronic changes of pulmonary sarcoidosis with biapical predominant pulmonary fibrosis.  Very mild superimposed active inflammatory infiltrate.  Ultrasound of the abdomen showed liver echogenicity.  Admission chemistries BUN 37 creatinine 4.16 lactic acid 5.8 urinalysis negative nitrite urine culture greater 100,000 Klebsiella troponin 88 WBC 32,900 blood cultures Enterobacter and Klebsiella.  Placed on broad-spectrum antibiotics for suspect sepsis secondary to community-acquired pneumonia and UTI.  Renal service consulted for AKI placed on lactated Ringer's renal ultrasound no hydronephrosis.  Small volume right upper quadrant ascites.  No indications for hemodialysis.  Infectious disease consulted 07/13/2020 for Klebsiella bacteremia maintained on ceftriaxone x112 days that has since been completed.  CT of abdomen completed to look for renal stone that showed a 7 to 8 mm proximal right ureteral stone with obstructive change and perinephric stranding.  Minimal free fluid within the abdominal and pelvis.  Urology consulted cystoscopy as well as bilateral retrograde pyelogram with right and left ureteral stent placement 07/14/2020.   Patient would need bilateral outpatient ureteroscopy, laser lithotripsy laser lithotripsy and stent exchange in 4 to 8 weeks.  Patient did remain intubated for a short time.  Neurology consulted 07/16/2020 for right side weakness slurred speech.  MRI showed severely limited evaluation with multiple sequences.  Diffusion-weighted imaging  significantly limited with multiple small areas of DWI hyperintensity in the bilateral white matter representing artifact versus small infarcts.  CT angiogram of head and neck limited with no evidence of large vessel occlusion or significant arterial stenosis.  MRI later repeated 07/21/2020 unchanged DWI from prior imaging with small vague foci of trace diffusion abnormality suggesting scattered small foci of subacute white matter ischemia.  Underlying advanced but nonspecific chronic signal changes in the cerebral white matter in the pons redemonstrated most commonly due to small vessel disease.  EEG showed background slowing no seizure activity.  Echocardiogram with ejection fraction of 30 to 35% left ventricle demonstrating global hypokinesis.  Echo bubble study no evidence of thrombus grade 3 diastolic dysfunction.  Podiatry as well as vascular surgery consulted for necrosis dry gangrene of both feet left greater than right 07/20/2020 with ischemic changes vascular surgery did not feel any CT angiogram or intervention needed at this time follow-up outpatient.  HIT had been ruled out.  Maintained on subcutaneous heparin for DVT prophylaxis.  Therapy evaluations completed and patient was admitted for a comprehensive rehab program   Hospital Course: Stacie [name] was admitted to rehab 07/28/2020 for inpatient therapies to consist of PT, ST and OT at least three hours five days a week. Past admission physiatrist, therapy team and rehab RN have worked together to provide customized collaborative inpatient rehab.  Pertaining to patient's sepsis encephalopathy embolic infarcts  remained stable low-dose aspirin therapy.  Subcutaneous heparin for DVT prophylaxis.  Mood stabilization with Lexapro emotional support provided.  She did have a history of pulmonary sarcoidosis 2 L baseline nasal cannula of oxygen as well as chronic prednisone with methotrexate and follow-up outpatient.  Blood pressures well controlled.  Synthroid ongoing for hypothyroidism Lipitor for hyperlipidemia.  AKI stable latest creatinine 1.17.  Bilateral lower extremity ischemia no urgent indication for lower extremity angiogram at present she would follow-up vascular surgery.  Initial urinary retention Foley tube removed voiding trial without difficulty.   Blood pressures were monitored on TID basis and soft and monitored     Rehab course: During patient's stay in rehab weekly team conferences were held to monitor patient's progress, set goals and discuss barriers to discharge. At admission, patient required +2 physical assist sit to stand moderate assist supine to sit needs assistance overall for ADLs  Physical exam.  Blood pressure 144/66 pulse 89 temperature 98.1 respirations 20 oxygen saturation 96% room air General.  Alert and oriented x3 no distress HEENT Head.  Normocephalic and atraumatic Eyes.  Pupils round and reactive to light no discharge.nystagmus Neck.  Supple nontender no JVD without thyromegaly Cardiac regular rate rhythm without any extra sounds or murmur heard Abdomen.  Soft nontender positive bowel sounds without rebound Extremities.  Medial toes bilaterally are necrotic Neuro.  Alert a bit restless follows commands easily distracted left side 5/5 right side 4+/5  /She  has had improvement in activity tolerance, balance, postural control as well as ability to compensate for deficits. Stacie [name] has had improvement in functional use RUE/LUE  and RLE/LLE as well as improvement in awareness.  Participated in ambulation around the room without assistive device monitoring of respiratory  status.  Completed car transfer supervision ambulates 85 feet rolling walker contact-guard with rest breaks.  Gather his belongings for activities ad lib. and homemaking.  Family teaching completed plan discharge to home       Disposition: Discharged to home    Diet: Regular  Special Instructions: No driving smoking or alcohol  Medications at discharge 1.  Tylenol as needed 2.  Continue nebulizer as directed 3.  Aspirin 81 mg p.o. daily 4.  Lipitor 40 mg p.o. daily 5.  Colace 100 mg p.o. twice daily 6.  Trusopt ophthalmic solution 1 drop both eyes twice daily 7.  Lexapro 20 mg p.o. daily 8.xalatan ophthalmic solution 1 drop right eye nightly 9.  Synthroid 137 mcg p.o. daily 10.  Rheumatrex 25 mg weekly 11.  Multivitamin daily 12.  Protonix 40 mg p.o. daily 13.  Prednisone 15 mg p.o. breakfast 14.  Flomax 0.4 mg p.o. daily 15.Tyvaso 18 mcg 4 times daily  30-35 minutes were spent completing discharge summary and discharge planning  Discharge Instructions    Ambulatory referral to Neurology   Complete by: As directed    An appointment is requested in approximately 4 weeks sepsis with encephalopathy   Ambulatory referral to Physical Medicine Rehab   Complete by: As directed    Moderate complexity follow-up 1 to 2 weeks bilateral cerebral embolic infarctions       Follow-up Information    Kirsteins, Luanna Salk, MD Follow up.   Specialty: Physical Medicine and Rehabilitation Why: Office to call for appointment Contact information: [place] [address] [phone]        Marcelle Overlie A, PA-C Follow up.   Specialty: Vascular Surgery Why: Call for appointment Contact information: [place] [address] [phone]        Billey Co, MD Follow up.   Specialty: Urology Why: Call for appointment Contact information: [place] [address] [phone]        Tsosie Billing, MD  Follow up.   Specialty: Infectious Diseases Why: Call for appointment Contact information: [place] [address] [phone]        Lavonia Dana, MD Follow up.   Specialty: Nephrology Why: Call for appointment as needed Contact information: [address] [place] [address] [phone]               Signed: Lavon Paganini Hendrum 08/14/2020, 5:21 AM

## 2020-08-12 NOTE — Progress Notes (Signed)
Physical Therapy Session Note  Patient Details  Name: Stacie [name] MRN: [id number] Date of Birth: [birth year]  Today's Date: 08/12/2020 PT Individual Time: 0805-0915 PT Individual Time Calculation (min): 70 min   Short Term Goals: Week 2:  PT Short Term Goal 1 (Week 2): Pt will transfer to and from [facility] with min assist PT Short Term Goal 2 (Week 2): Pt wil ambulate 83f with RW and min assist PT Short Term Goal 3 (Week 2): Pt will ascend 4 steps with BUE support on mod assist  Skilled Therapeutic Interventions/Progress Updates: Pt presented in w/c agreeable to therapy. Pt denies pain at start of session. Pt participated in ambulation around room without AD to obtain sweatshirt with CGA. Pt indicated some lightheadedness upon completion which resolved with sitting. Respiratory therapist arrived for breathing treatment, upon completion pt transported to ortho gym and discussed car transport home. Set up to 31in seat height ([place]) with foot runner, upon standing pt indicated increased lightheadedness. BP orthostatics checked: sitting 106/62 (77) HR100, standing 98/50 (61) HR 108 mild symptoms, standing 3 min 100/89 (95) HR 114. Pt completed car transfer at 29in height with supervision and increased time.Pt agreeable to obtain lower vehicle (compact SUV or sedan height). Pt then ambulated 871fwith RW and CGA nearing close supervision. Pt indicated some lightheadedness during ambulation which resolved with seated rest. Pt transported remaining distance to day room and performed ambulatory transfer to NuStep with RW and supervision. Pt participated in NuStep L3 BLE only x 3 min with pt indicating 13 on Borg exertion scale. Pt then performed additional 2 minutes BLE only. Pt then performed ambulatory transfer back to w/c in same manner as prior and transferred back to room. Pt left in w/c at end of session with seat alarm on, call bell within reach and needs met.      Therapy  Documentation Precautions:  Precautions Precautions: Fall Precaution Comments: monitor O2, 2L continuous Restrictions Weight Bearing Restrictions: No General:   Vital Signs: Therapy Vitals Temp: 97.8 F (36.6 C) Temp Source: Oral Pulse Rate: 100 Resp: 18 BP: 110/62 Patient Position (if appropriate): Sitting   Therapy/Group: Individual Therapy  [place]  Aretha Levi, PTA  08/12/2020, 12:45 PM

## 2020-08-12 NOTE — Progress Notes (Signed)
PHYSICAL MEDICINE & REHABILITATION PROGRESS NOTE   Subjective/Complaints:  No foot pain, was dizzy in therapy this am BP a little low. ROS: Denies CP, SOB, N/V/D  Objective:   No results found. Recent Labs    08/10/20 0844  WBC 5.5  HGB 9.8*  HCT 31.7*  PLT 151   Recent Labs    08/10/20 0844  NA 142  K 4.0  CL 100  CO2 32  GLUCOSE 128*  BUN 26*  CREATININE 1.17*  CALCIUM 9.4    Intake/Output Summary (Last 24 hours) at 08/12/2020 0855 Last data filed at 08/11/2020 1700 Gross per 24 hour  Intake 440 ml  Output --  Net 440 ml        Physical Exam: Vital Signs Blood pressure 116/60, pulse 88, temperature 97.9 F (36.6 C), resp. rate 18, height _0  (1.727 m), weight 79.9 kg, SpO2 100 %.    General: No acute distress Mood and affect are appropriate Heart: Regular rate and rhythm no rubs murmurs or extra sounds Lungs: Clear to auscultation, breathing unlabored, no rales or wheezes Abdomen: Positive bowel sounds, soft nontender to palpation, nondistended Extremities: No clubbing, cyanosis, or edema Skin: see below    10/25  10/22  10/17        10/27  Neuro: Alert HOH Motor: Motor: Bilateral upper extremities: 4/5 proximal distal,?  Full effort Bilateral lower extremities: Hip flexion: 2+/5, knee extension 3/5, ankle dorsiflexion 3/5   Assessment/Plan: 1. Functional deficits secondary to debility post urosepsis which require 3+ hours per day of interdisciplinary therapy in a comprehensive inpatient rehab setting.  Physiatrist is providing close team supervision and 24 hour management of active medical problems listed below.  Physiatrist and rehab team continue to assess barriers to discharge/monitor patient progress toward functional and medical goals  Care Tool:  Bathing    Body parts bathed by patient: Right arm, Left arm, Chest, Abdomen, Front perineal area, Buttocks, Right upper leg, Left upper leg, Right lower leg,  Left lower leg, Face   Body parts bathed by helper: Buttocks, Right lower leg, Left lower leg     Bathing assist Assist Level: Contact Guard/Touching assist     Upper Body Dressing/Undressing Upper body dressing   What is the patient wearing?: Pull over shirt    Upper body assist Assist Level: Set up assist    Lower Body Dressing/Undressing Lower body dressing      What is the patient wearing?: Pants, Underwear/pull up     Lower body assist Assist for lower body dressing: Contact Guard/Touching assist     Toileting Toileting Toileting Activity did not occur (Clothing management and hygiene only): N/A (no void or bm)  Toileting assist Assist for toileting: Moderate Assistance - Patient 50 - 74%     Transfers Chair/bed transfer  Transfers assist     Chair/bed transfer assist level: Supervision/Verbal cueing Chair/bed transfer assistive device: Programmer, multimedia   Ambulation assist   Ambulation activity did not occur: Safety/medical concerns  Assist level: Minimal Assistance - Patient > 75% Assistive device: Walker-rolling Max distance: 77f   Walk 10 feet activity   Assist  Walk 10 feet activity did not occur: Safety/medical concerns  Assist level: Minimal Assistance - Patient > 75% Assistive device: Walker-rolling   Walk 50 feet activity   Assist Walk 50 feet with 2 turns activity did not occur: Safety/medical concerns         Walk 150 feet activity   Assist Walk  150 feet activity did not occur: Safety/medical concerns         Walk 10 feet on uneven surface  activity   Assist Walk 10 feet on uneven surfaces activity did not occur: Safety/medical concerns         Wheelchair     Assist Will patient use wheelchair at discharge?: Yes Type of Wheelchair: Manual Wheelchair activity did not occur: Safety/medical concerns  Wheelchair assist level: Supervision/Verbal cueing Max wheelchair distance: 157f     Wheelchair 50 feet with 2 turns activity    Assist    Wheelchair 50 feet with 2 turns activity did not occur: Safety/medical concerns   Assist Level: Supervision/Verbal cueing   Wheelchair 150 feet activity     Assist  Wheelchair 150 feet activity did not occur: Safety/medical concerns   Assist Level: Supervision/Verbal cueing    Medical Problem List and Plan: 1.  Altered mental status with decreased functional mobility secondary to sepsis/encephalopathy/embolic cerebral infarcts              Continue CIR PT, OT,  Team conference today please see physician documentation under team conference tab, met with team  to discuss problems,progress, and goals. Formulized individual treatment plan based on medical history, underlying problem and comorbidities.  2.  Antithrombotics: -DVT/anticoagulation: Subcutaneous heparin             -antiplatelet therapy: Aspirin 81 mg daily 3. Pain Management: Tylenol as needed.   Controlled with meds on 10/25 4. Mood: Lexapro 20 mg daily             -antipsychotic agents: N/A 5. Neuropsych: This patient is capable of making decisions on her own behalf. 6. Skin/Wound Care: Routine skin checks, add xeroform telfa and kerlix Left foot   Peripheral vasc disease vs small veseel embolic infarcts related to sepsis  Cyanotic left toes #1,2,3, and R #1, plan for VVS is to monitor  Demarcation and f/u as OP   10/18 post op sandal, continue local care  Skin looking irritated Left dorsum foot, will change xeroform to petroleumconsider silvadene 7. Fluids/Electrolytes/Nutrition: Routine in and outs. 8.  Pulmonary sarcoidosis.  Patient on 2 L nasal cannula baseline.  Follow-up outpatient  Continue supplemental oxygen 9.  Hypertension.  Monitor with increased mobility. Well controlled.  Vitals:   08/11/20 2010 08/12/20 0543  BP: 99/63 116/60  Pulse: 86 88  Resp: 16 18  Temp: 98 F (36.7 C) 97.9 F (36.6 C)  SpO2: 100% 100%   Controlled on  10/27 10.  Asthma.  Continue inhalers as directed 11.  Hyperlipidemia.  Lipitor 12.  Hypothyroidism.  Synthroid 13.  AKI.    Creatinine 1.02 on 10/13, repeat 1.17 yesterday  14.  Bilateral lower extremity ischemia.  No urgent indication for lower extremity angiogram at present.  Follow-up vascular surgery. 15.  Urinary retention.Resolved, voiding after foley removal  16.  Leukocytosis associated with bacteremia - resolved  17.  Constipation  Improving 18.  Acute blood loss anemia  Hemoglobin stable 19.  Thrombocytopenia  Platelets 130 on 10/18, resolved 150K yesterday  20.  Steroid-induced hyperglycemia  mild  LOS: 15 days A FACE TO FACE EVALUATION WAS PERFORMED  ACharlett Blake10/27/2021, 8:55 AM

## 2020-08-12 NOTE — Patient Care Conference (Signed)
Inpatient RehabilitationTeam Conference and Plan of Care Update Date: 08/12/2020   Time: 10:09 AM    Patient Name: Stacie [name]      Medical Record Number: [id number]  Date of Birth: 20-Dec-1955 Sex: Female         Room/Bed: 4W21C/4W21C-01 Payor Info: Payor: [place] / Plan: BCBS COMM PPO / Product Type: *No Product type* /    Admit Date/Time:  07/28/2020  3:13 PM  Primary Diagnosis:  Cerebrovascular accident (CVA) due to embolism of cerebral artery [facility])  Hospital Problems: Principal Problem:   Cerebrovascular accident (CVA) due to embolism of cerebral artery ([place]) Active Problems:   Sepsis ([place])   Thrombocytopenia ([place])   Acute blood loss anemia   Slow transit constipation   AKI (acute kidney injury) ([place])   Supplemental oxygen dependent   Labile blood glucose   Steroid-induced hyperglycemia    Expected Discharge Date: Expected Discharge Date: 08/14/20  Team Members Present: Physician leading conference: Dr. Alysia Penna Care Coodinator Present: Dorien Chihuahua, RN, BSN, CRRN;Christina Sampson Goon, [place] Nurse Present: Isla Pence, RN PT Present: Phylliss Bob, PTA OT Present: Laverle Hobby, OT PPS Coordinator present : Ileana Ladd, PT     Current Status/Progress Goal Weekly Team Focus  Bowel/Bladder             Swallow/Nutrition/ Hydration             ADL's   S bathing at shower level on tub bench, set-up UB dressing, S to CGA LB dressing with AE PRN, toilet transfers and toileting with S.  Goals upgraded S to Mod I  Activity tolernace, balance, ADL transfers, family ed.   Mobility   supervision bed mobilty, supervision transfers, gait CGA with RW approx 115f, x8 stairs bilateral rails  minA - may be upgraded  BLE strengthening, gait, balance, family ed   Communication             Safety/Cognition/ Behavioral Observations            Pain             Skin               Discharge Planning:  Discharging home with daughter and  spouse (daughter providing 24/7) Has home 02 set up. Set up on 1st floor   Team Discussion: Modifying dressing changes for blistered area on left foot. Vascular MD to follow  Up next week. Anticipate 2nd toe left foot non viable and tip of 3rd toe left foot non viable, vascular will determine best method to address this. Progress limited by fatigue and orthostasis episode; encouraged fluids po. Patient on target to meet rehab goals: yes, supervision goals  *See Care Plan and progress notes for long and short-term goals.   Revisions to Treatment Plan:  Working on ascending/descending stairs  Teaching Needs: Transfers, toileting, steps, medications, skin care, etc.  Current Barriers to Discharge: Decreased caregiver support and Home enviroment access/layout; [facility]  Possible Resolutions to Barriers: Family education scheduled for 08/13/20      Medical Summary Current Status: Low BP in am with therapy,  Barriers to Discharge: Medical stability;Wound care   Possible Resolutions to Barriers/Weekly Focus: family training for d/c in 2 d, skin dressing changes   Continued Need for Acute Rehabilitation Level of Care: The patient requires daily medical management by a physician with specialized training in physical medicine and rehabilitation for the following reasons: Direction of a multidisciplinary physical rehabilitation program to maximize  functional independence : Yes Medical management of patient stability for increased activity during participation in an intensive rehabilitation regime.: Yes Analysis of laboratory values and/or radiology reports with any subsequent need for medication adjustment and/or medical intervention. : Yes   I attest that I was present, lead the team conference, and concur with the assessment and plan of the team.   Dorien Chihuahua B 08/12/2020, 3:32 PM

## 2020-08-13 ENCOUNTER — Ambulatory Visit (HOSPITAL_COMMUNITY): Payer: BC Managed Care – PPO | Admitting: Physical Therapy

## 2020-08-13 ENCOUNTER — Encounter (HOSPITAL_COMMUNITY): Payer: BC Managed Care – PPO

## 2020-08-13 ENCOUNTER — Inpatient Hospital Stay (HOSPITAL_COMMUNITY): Payer: BC Managed Care – PPO | Admitting: Occupational Therapy

## 2020-08-13 ENCOUNTER — Inpatient Hospital Stay (HOSPITAL_COMMUNITY): Payer: BC Managed Care – PPO | Admitting: Physical Therapy

## 2020-08-13 LAB — GLUCOSE, CAPILLARY
Glucose-Capillary: 86 mg/dL (ref 70–99)
Glucose-Capillary: 99 mg/dL (ref 70–99)
Glucose-Capillary: 154 mg/dL — ABNORMAL HIGH (ref 70–99)
Glucose-Capillary: 132 mg/dL — ABNORMAL HIGH (ref 70–99)

## 2020-08-13 MED ORDER — PANTOPRAZOLE SODIUM 40 MG PO TBEC: 40.0000 mg | DELAYED_RELEASE_TABLET | Freq: Every day | ORAL | 0 refills | Status: DC

## 2020-08-13 MED ORDER — FOLIC ACID 1 MG PO TABS: 1.0000 mg | ORAL_TABLET | Freq: Every day | ORAL | 11 refills | Status: DC

## 2020-08-13 MED ORDER — ATORVASTATIN CALCIUM 40 MG PO TABS: 40.0000 mg | ORAL_TABLET | Freq: Every day | ORAL | 0 refills | Status: DC

## 2020-08-13 MED ORDER — TAMSULOSIN HCL 0.4 MG PO CAPS: 0.4000 mg | ORAL_CAPSULE | Freq: Every day | ORAL | 0 refills | Status: DC

## 2020-08-13 MED ORDER — ESCITALOPRAM OXALATE 20 MG PO TABS: 20.0000 mg | ORAL_TABLET | Freq: Every day | ORAL | 0 refills | Status: DC

## 2020-08-13 MED ORDER — ACETAMINOPHEN 325 MG PO TABS: 650.0000 mg | ORAL_TABLET | ORAL | Status: DC | PRN

## 2020-08-13 MED ORDER — LEVOTHYROXINE SODIUM 137 MCG PO TABS: ORAL_TABLET | ORAL | 0 refills | Status: DC

## 2020-08-13 MED ORDER — BUDESONIDE-FORMOTEROL FUMARATE 80-4.5 MCG/ACT IN AERO: INHALATION_SPRAY | RESPIRATORY_TRACT | 1 refills | Status: DC

## 2020-08-13 NOTE — Progress Notes (Signed)
Occupational Therapy Discharge Summary  Patient Details  Name: Stacie [name] MRN: [id number] Date of Birth: 1956/07/03  Today's Date: 08/13/2020 OT Individual Time: 0900-1000 OT Individual Time Calculation (min): 60 min   Pt received in bed agreeable to OT with daughter present for family educaiton and no pain reported. Pt completes bed mobility and transfers at supervision level. OT reviews DME, AE, supervision of transfers, management of O2 tank, energy conservation (handout provided), providing CGA at hips (PRN), elevated surface height, grab bar placement, bagging/waterproofing BLE in shower and BADL techniques with daughter and pt. Pt and daughter perform shower stall transfers as well as toilet transfers at ambulatory level with VC only for daughter to be behind or beside pt within arms proximity to A as needed. All questions answered. Exited session with pt seated in w/c, exit alarm on and call light in reach   Patient has met 8 of 10 long term goals due to improved activity tolerance, improved balance, postural control and ability to compensate for deficits.  Patient to discharge at overall Supervision level.  Patient's care partner is independent to provide the necessary physical assistance at discharge.    Reasons goals not met: Pt requires set up for UB and LB dressing  Recommendation:  Patient will benefit from ongoing skilled OT services in home health setting to continue to advance functional skills in the area of BADL and iADL.  Equipment: BSC  Reasons for discharge: treatment goals met and discharge from hospital  Patient/family agrees with progress made and goals achieved: Yes  OT Discharge Precautions/Restrictions    General   Vital Signs   Pain   ADL ADL Upper Body Bathing: Moderate assistance Where Assessed-Upper Body Bathing: Edge of bed Lower Body Bathing: Maximal assistance Where Assessed-Lower Body Bathing: Edge of bed Upper Body Dressing: Moderate  assistance Where Assessed-Upper Body Dressing: Edge of bed Lower Body Dressing: Dependent Where Assessed-Lower Body Dressing: Bed level ADL Comments: bed level ADLs at eval d/t decreased endurance/tolerance Vision   WNL Perception    WNL Praxis   WNL Cognition   WNL Sensation   impaired BLE Motor    abnormal postral alignment Mobility    Supervision ambulation, MOD I transfers stand pivot with RW Trunk/Postural Assessment    head forward, rounded shoulders, posterior pelvic preference Balance   MOD I static standing balance; SBA dynamic standing balance Extremity/Trunk Assessment   BUE WNL     Lowella Dell Shakesha Soltau 08/13/2020, 9:56 AM

## 2020-08-13 NOTE — Progress Notes (Signed)
Occupational Therapy Session Note  Patient Details  Name: Stacie [name] MRN: [id number] Date of Birth: 02/01/1956  Today's Date: 08/13/2020 OT Individual Time: 1300-1335 OT Individual Time Calculation (min): 35 min    Short Term Goals: Week 2:  OT Short Term Goal 1 (Week 2): Patient will compete 1/3 parts of LB dressing task with no more than Min A. OT Short Term Goal 2 (Week 2): Patient will complete toilet transfer with LRAD and Min A. OT Short Term Goal 3 (Week 2): Patient will complete 3/3 parts of toileting task with Min A. OT Short Term Goal 4 (Week 2): Patient will complete 1 grooming task in standing without rest break or significant drop in SpO2.  Skilled Therapeutic Interventions/Progress Updates:    Pt supine in bed, no c/o pain, requesting to wash up sinkside.  Pt completed all functional mobility including supine<> sit, sit<>stand, and short in room ambulation EOB<>sinkside and sinkside<>toilet in bathroom with supervision using RW as needed.  Pt completed UB dressing and bathing with setup and LB bathing and dressing with supervision.  Pt required intermittent VCs to facilitate improved pursed lip breathing follow through.  Pt requesting to toilet and completed toileting with mod I.  Pt returned to bed, call bell in reach, bed alarm on.    Therapy Documentation Precautions:  Precautions Precautions: Fall Precaution Comments: monitor O2, 2L continuous Restrictions Weight Bearing Restrictions: No   Therapy/Group: Individual Therapy  Ezekiel Slocumb 08/13/2020, 4:25 PM

## 2020-08-13 NOTE — Discharge Instructions (Signed)
Inpatient Rehab Discharge Instructions  Stacie [name] Discharge date and time: No discharge date for patient encounter.   Activities/Precautions/ Functional Status: Activity: activity as tolerated Diet: regular diet Wound Care: Routine skin checks Functional status:  ___ No restrictions     ___ Walk up steps independently ___ 24/7 supervision/assistance   ___ Walk up steps with assistance ___ Intermittent supervision/assistance  ___ Bathe/dress independently ___ Walk with walker     _x__ Bathe/dress with assistance ___ Walk Independently    ___ Shower independently ___ Walk with assistance    ___ Shower with assistance ___ No alcohol     ___ Return to work/school ________  COMMUNITY REFERRALS UPON DISCHARGE:    Home Health:   PT     OT       RN                   Agency: [facility] Phone: [phone]    Medical Equipment/Items Ordered: Wheelchair, Conservation officer, nature, Bedside Commode                                                 Agency/Supplier: [employer]   Special Instructions: No driving smoking or alcohol   My questions have been answered and I understand these instructions. I will adhere to these goals and the provided educational materials after my discharge from the hospital.  Patient/Caregiver Signature _______________________________ Date __________  Clinician Signature _______________________________________ Date __________  Please bring this form and your medication list with you to all your follow-up doctor's appointments.

## 2020-08-13 NOTE — Progress Notes (Signed)
Physical Therapy Discharge Summary  Patient Details  Name: Stacie [name] MRN: [id number] Date of Birth: May 29, 1956  Today's Date: 08/13/2020 PT Individual Time: 1015-1110 AND  And 8016-5537 PT Individual Time Calculation (min): 55 min and 42    Patients has met 9 of 9 long term goals due to improved activity tolerance, improved balance, improved postural control, increased strength, increased range of motion, functional use of  right lower extremity and left lower extremity and improved coordination.  Patient to discharge at an ambulatory level Supervision.   Patient's care partner is independent to provide the necessary physical assistance at discharge.  Reasons goals not met: All PT goals met   Recommendation:  Patient will benefit from ongoing skilled PT services in home health setting to continue to advance safe functional mobility, address ongoing impairments in balance, endurance, stair management, strength, safety, and minimize fall risk.  Equipment: No equipment provided  Reasons for discharge: treatment goals met and discharge from hospital  Patient/family agrees with progress made and goals achieved: Yes    PT treatment:  Pt received sitting in WC and agreeable to PT. Pt's daughter present for family education and d/c planning. PT instructed pt in Grad day assessment to measure progress toward goals. See below for details.   WC mobility x 162f without cues or assist from PT through hall of rehab unnit but increased time required due to fatigue.   Gait training with RW through rehab gym x 1573fand 7597fith supervision assist provided by PT and from daughter for safety and O2 management.   Stair management training 4 x 2 with 2UE support on 1 rail to simulate home access. Min Assist provided by PT and daughter with cues for step gait pattern to improve safety and protect Bil feet.   Car transfer training. With min assist from sedan height car with cues for proper UE  placement to improve success with sit>stand from car seat as well as facilitate anterior weight shift  Patient returned to room and [facility] reach and all needs met.    Session 2.  Pt received supine in bed and agreeable to PT. Supine>sit transfer without assist. Stand pivot transfer to [facility] and RW. Pt transported to rehab gym in [facility] instructed in modified [place] level A and B with Handout provided for HEP. Cues for use of UE support on RW or counter of all standing exercises for safety. Patient returned to room and left sitting in WC [facility] call bell in reach and all needs met.          PT Discharge Precautions/Restrictions   fall,  Vital Signs Therapy Vitals Temp: 98 F (36.7 C) Pulse Rate: 98 Resp: 18 BP: 122/69 Patient Position (if appropriate): Lying Oxygen Therapy SpO2: 100 % O2 Device: Room Air Pain    deines Vision/Perception    WFL Sensation Sensation Light Touch: Appears Intact Coordination Gross Motor Movements are Fluid and Coordinated: Yes Fine Motor Movements are Fluid and Coordinated: Yes Finger Nose Finger Test: WFLMid[facility] Care Hospitalel Shin Test: WFLWindm[facility] Healthcare Of Clearwatertor  Motor Motor: Other (comment) Motor - Skilled Clinical Observations: mild deconditioning.  Mobility Bed Mobility Bed Mobility: Rolling Right;Rolling Left;Sitting - Scoot to Edge of Bed;Sit to Supine;Supine to Sit Rolling Right: Independent Rolling Left: Independent Supine to Sit: Independent Sit to Supine: Independent Transfers Transfers: Sit to Stand;Stand to SitLockheed Martinansfers Sit to Stand: Independent with assistive device Stand to Sit: Independent with assistive device  Stand Pivot Transfers: Supervision/Verbal cueing Transfer (Assistive device): Rolling walker Locomotion  Gait Ambulation: Yes Gait Assistance: Supervision/Verbal cueing Gait Distance (Feet): 150 Feet Gait Gait: Yes Gait Pattern: Step-through pattern;Narrow base of  support Stairs / Additional Locomotion Stairs: Yes Stairs Assistance: Minimal Assistance - Patient > 75% Number of Stairs: 4 Height of Stairs: 6 Wheelchair Mobility Wheelchair Mobility: Yes Wheelchair Assistance: Independent with Camera operator: Both upper extremities Distance: 150  Trunk/Postural Assessment  Cervical Assessment Cervical Assessment: Within Functional Limits Thoracic Assessment Thoracic Assessment: Within Functional Limits Lumbar Assessment Lumbar Assessment: Within Functional Limits Postural Control Postural Control: Within Functional Limits  Balance Balance Balance Assessed: Yes Static Sitting Balance Static Sitting - Level of Assistance: 7: Independent Dynamic Sitting Balance Dynamic Sitting - Level of Assistance: 7: Independent Static Standing Balance Static Standing - Balance Support: Bilateral upper extremity supported Static Standing - Level of Assistance: 6: Modified independent (Device/Increase time) Dynamic Standing Balance Dynamic Standing - Balance Support: Bilateral upper extremity supported Dynamic Standing - Level of Assistance: 5: Stand by assistance Extremity Assessment      RLE Assessment RLE Assessment: Within Functional Limits General Strength Comments: grossly 4/5 proximal to distal LLE Assessment LLE Assessment: Exceptions to [facility] General Strength Comments: grossly 4/5 proximal to distal    Lorie Phenix 08/14/2020, 6:24 AM

## 2020-08-13 NOTE — Progress Notes (Signed)
Inpatient Rehabilitation Care Coordinator  Discharge Note  The overall goal for the admission was met for:   Discharge location: Yes, Home  Length of Stay: Yes, 17 Days  Discharge activity level: Yes, Supervision A  Home/community participation: Yes  Services provided included: MD, RD, PT, OT, SLP, RN, CM, TR, Pharmacy, Neuropsych and SW  Financial Services: Private Insurance: [place]  Follow-up services arranged: Home Health: [facility]  Comments (or additional information): SN PT OT  Patient/Family verbalized understanding of follow-up arrangements: Yes  Individual responsible for coordination of the follow-up plan: Dr. Margaretha Sheffield [phone]  Confirmed correct DME delivered: Dyanne Iha 08/13/2020    Dyanne Iha

## 2020-08-13 NOTE — Progress Notes (Signed)
[name] PHYSICAL MEDICINE & REHABILITATION PROGRESS NOTE   Subjective/Complaints:  No foot pain, blister "popped" right great toe, daughter coming in for family training   ROS: Denies CP, SOB, N/V/D  Objective:   No results found. Recent Labs    08/10/20 0844  WBC 5.5  HGB 9.8*  HCT 31.7*  PLT 151   Recent Labs    08/10/20 0844  NA 142  K 4.0  CL 100  CO2 32  GLUCOSE 128*  BUN 26*  CREATININE 1.17*  CALCIUM 9.4    Intake/Output Summary (Last 24 hours) at 08/13/2020 0806 Last data filed at 08/13/2020 0700 Gross per 24 hour  Intake 660 ml  Output --  Net 660 ml        Physical Exam: Vital Signs Blood pressure 110/60, pulse 90, temperature 97.8 F (36.6 C), resp. rate 18, height _0  (1.727 m), weight 78.2 kg, SpO2 100 %.    General: No acute distress Mood and affect are appropriate Heart: Regular rate and rhythm no rubs murmurs or extra sounds Lungs: Clear to auscultation, breathing unlabored, no rales or wheezes Abdomen: Positive bowel sounds, soft nontender to palpation, nondistended Extremities: No clubbing, cyanosis, or edema Skin: see below    10/25  10/22  10/17        10/27  Neuro: Alert HOH Motor: Motor: Bilateral upper extremities: 4/5 proximal distal,?  Full effort Bilateral lower extremities: Hip flexion: 2+/5, knee extension 3/5, ankle dorsiflexion 3/5   Assessment/Plan: 1. Functional deficits secondary to debility post urosepsis which require 3+ hours per day of interdisciplinary therapy in a comprehensive inpatient rehab setting.  Physiatrist is providing close team supervision and 24 hour management of active medical problems listed below.  Physiatrist and rehab team continue to assess barriers to discharge/monitor patient progress toward functional and medical goals  Care Tool:  Bathing    Body parts bathed by patient: Right arm, Left arm, Chest, Abdomen, Front perineal area, Buttocks, Right upper leg, Left  upper leg, Right lower leg, Left lower leg, Face   Body parts bathed by helper: Buttocks, Right lower leg, Left lower leg     Bathing assist Assist Level: Contact Guard/Touching assist     Upper Body Dressing/Undressing Upper body dressing   What is the patient wearing?: Pull over shirt    Upper body assist Assist Level: Set up assist    Lower Body Dressing/Undressing Lower body dressing      What is the patient wearing?: Pants, Underwear/pull up     Lower body assist Assist for lower body dressing: Contact Guard/Touching assist     Toileting Toileting Toileting Activity did not occur (Clothing management and hygiene only): N/A (no void or bm)  Toileting assist Assist for toileting: Moderate Assistance - Patient 50 - 74%     Transfers Chair/bed transfer  Transfers assist     Chair/bed transfer assist level: (P) Supervision/Verbal cueing Chair/bed transfer assistive device: (P) Walker   Locomotion Ambulation   Ambulation assist   Ambulation activity did not occur: Safety/medical concerns  Assist level: (P) Contact Guard/Touching assist Assistive device: (P) Walker-rolling Max distance: (P) 32f   Walk 10 feet activity   Assist  Walk 10 feet activity did not occur: Safety/medical concerns  Assist level: (P) Contact Guard/Touching assist Assistive device: (P) Walker-rolling   Walk 50 feet activity   Assist Walk 50 feet with 2 turns activity did not occur: Safety/medical concerns  Assist level: (P) Contact Guard/Touching assist Assistive device: (P) Walker-rolling  Walk 150 feet activity   Assist Walk 150 feet activity did not occur: (P) Safety/medical concerns         Walk 10 feet on uneven surface  activity   Assist Walk 10 feet on uneven surfaces activity did not occur: Safety/medical concerns         Wheelchair     Assist Will patient use wheelchair at discharge?: Yes Type of Wheelchair: Manual Wheelchair activity did  not occur: Safety/medical concerns  Wheelchair assist level: Supervision/Verbal cueing Max wheelchair distance: 152f    Wheelchair 50 feet with 2 turns activity    Assist    Wheelchair 50 feet with 2 turns activity did not occur: Safety/medical concerns   Assist Level: Supervision/Verbal cueing   Wheelchair 150 feet activity     Assist  Wheelchair 150 feet activity did not occur: Safety/medical concerns   Assist Level: Supervision/Verbal cueing    Medical Problem List and Plan: 1.  Altered mental status with decreased functional mobility secondary to sepsis/encephalopathy/embolic cerebral infarcts              Continue CIR PT, OT,  Plan dc in am   2.  Antithrombotics: -DVT/anticoagulation: Subcutaneous heparin             -antiplatelet therapy: Aspirin 81 mg daily 3. Pain Management: Tylenol as needed.   Controlled with meds on 10/25 4. Mood: Lexapro 20 mg daily             -antipsychotic agents: N/A 5. Neuropsych: This patient is capable of making decisions on her own behalf. 6. Skin/Wound Care: Routine skin checks, add xeroform telfa and kerlix Left foot   Peripheral vasc disease vs small veseel embolic infarcts related to sepsis  Cyanotic left toes #1,2,3, and R #1, plan for VVS is to monitor  Demarcation and f/u as OP   10/18 post op sandal, continue local care  Skin looking irritated Left dorsum foot, will change xeroform to petroleum consider silvadene Right great toe dorsal blister rupture, plantar blister is still intact 7. Fluids/Electrolytes/Nutrition: Routine in and outs. 8.  Pulmonary sarcoidosis.  Patient on 2 L nasal cannula baseline.  Follow-up outpatient  Continue supplemental oxygen 9.  Hypertension.  Monitor with increased mobility. Well controlled.  Vitals:   08/12/20 1941 08/13/20 0418  BP: (!) 102/57 110/60  Pulse: 93 90  Resp: 18 18  Temp: 98.2 F (36.8 C) 97.8 F (36.6 C)  SpO2: 98% 100%   Controlled on 10/28 10.  Asthma.  Continue  inhalers as directed 11.  Hyperlipidemia.  Lipitor 12.  Hypothyroidism.  Synthroid 13.  AKI.    Creatinine 1.02 on 10/13, repeat 1.17 yesterday  14.  Bilateral lower extremity ischemia.  No urgent indication for lower extremity angiogram at present.  Follow-up vascular surgery. 15.  Urinary retention.Resolved, voiding after foley removal  16.  Leukocytosis associated with bacteremia - resolved  17.  Constipation  Improving 18.  Acute blood loss anemia  Hemoglobin stable 19.  Thrombocytopenia  Platelets 130 on 10/18, resolved 150K yesterday  20.  Steroid-induced hyperglycemia  mild  LOS: 16 days A FACE TO FACE EVALUATION WAS PERFORMED  ACharlett Blake10/28/2021, 8:06 AM

## 2020-08-14 ENCOUNTER — Ambulatory Visit (INDEPENDENT_AMBULATORY_CARE_PROVIDER_SITE_OTHER): Payer: Self-pay | Admitting: Vascular Surgery

## 2020-08-14 DIAGNOSIS — I6340 Cerebral infarction due to embolism of unspecified cerebral artery: Secondary | ICD-10-CM | POA: Diagnosis not present

## 2020-08-14 LAB — GLUCOSE, CAPILLARY: Glucose-Capillary: 101 mg/dL — ABNORMAL HIGH (ref 70–99)

## 2020-08-14 NOTE — Progress Notes (Signed)
Pt discharged by P.A. no further questions at time of transport. Transporting home via private vehicle by spouse

## 2020-08-14 NOTE — Progress Notes (Signed)
[facility] PHYSICAL MEDICINE & REHABILITATION PROGRESS NOTE   Subjective/Complaints:  Family training completed   ROS: Denies CP, SOB, N/V/D  Objective:   No results found. No results for input(s): WBC, HGB, HCT, PLT in the last 72 hours. No results for input(s): NA, K, CL, CO2, GLUCOSE, BUN, CREATININE, CALCIUM in the last 72 hours.  Intake/Output Summary (Last 24 hours) at 08/14/2020 0833 Last data filed at 08/14/2020 0740 Gross per 24 hour  Intake 240 ml  Output --  Net 240 ml        Physical Exam: Vital Signs Blood pressure 122/69, pulse 98, temperature 98 F (36.7 C), resp. rate 18, height _0  (1.727 m), weight 79 kg, SpO2 100 %.     General: No acute distress Mood and affect are appropriate Heart: Regular rate and rhythm no rubs murmurs or extra sounds Lungs: Clear to auscultation, breathing unlabored, no rales or wheezes Abdomen: Positive bowel sounds, soft nontender to palpation, nondistended    Skin: see below    10/25  10/22  10/17        10/27    10/29 Bilateral lower extremities: Hip flexion: 2+/5, knee extension 3/5, ankle dorsiflexion 3/5   Assessment/Plan: 1. Functional deficits secondary to debility post urosepsis  Stable for D/C today F/u PCP in 3-4 weeks F/u PM&R 2 weeks See D/C summary See D/C instructions  Care Tool:  Bathing    Body parts bathed by patient: Right arm, Left arm, Chest, Abdomen, Front perineal area, Buttocks, Right upper leg, Left upper leg, Face, Left lower leg, Right lower leg   Body parts bathed by helper: Buttocks, Right lower leg, Left lower leg     Bathing assist Assist Level: Supervision/Verbal cueing     Upper Body Dressing/Undressing Upper body dressing   What is the patient wearing?: Pull over shirt    Upper body assist Assist Level: Set up assist    Lower Body Dressing/Undressing Lower body dressing      What is the patient wearing?: Pants, Underwear/pull up     Lower  body assist Assist for lower body dressing: Supervision/Verbal cueing     Toileting Toileting Toileting Activity did not occur (Clothing management and hygiene only): N/A (no void or bm)  Toileting assist Assist for toileting: Independent with assistive device Assistive Device Comment: RW   Transfers Chair/bed transfer  Transfers assist     Chair/bed transfer assist level: Supervision/Verbal cueing Chair/bed transfer assistive device: Programmer, multimedia   Ambulation assist   Ambulation activity did not occur: Safety/medical concerns  Assist level: Supervision/Verbal cueing Assistive device: Walker-rolling Max distance: 150   Walk 10 feet activity   Assist  Walk 10 feet activity did not occur: Safety/medical concerns  Assist level: Supervision/Verbal cueing Assistive device: Walker-rolling   Walk 50 feet activity   Assist Walk 50 feet with 2 turns activity did not occur: Safety/medical concerns  Assist level: Supervision/Verbal cueing Assistive device: Walker-rolling    Walk 150 feet activity   Assist Walk 150 feet activity did not occur: (P) Safety/medical concerns  Assist level: Supervision/Verbal cueing Assistive device: Walker-rolling    Walk 10 feet on uneven surface  activity   Assist Walk 10 feet on uneven surfaces activity did not occur: Safety/medical concerns   Assist level: Contact Guard/Touching assist Assistive device: Aeronautical engineer Will patient use wheelchair at discharge?: Yes Type of Wheelchair: Manual Wheelchair activity did not occur: Safety/medical concerns  Wheelchair assist level:  Independent Max wheelchair distance: 150    Wheelchair 50 feet with 2 turns activity    Assist    Wheelchair 50 feet with 2 turns activity did not occur: Safety/medical concerns   Assist Level: Independent   Wheelchair 150 feet activity     Assist  Wheelchair 150 feet activity did not  occur: Safety/medical concerns   Assist Level: Independent    Medical Problem List and Plan: 1.  Altered mental status with decreased functional mobility secondary to sepsis/encephalopathy/embolic cerebral infarcts              Continue CIR PT, OT,  Plan dc today   2.  Antithrombotics: -DVT/anticoagulation: Subcutaneous heparin             -antiplatelet therapy: Aspirin 81 mg daily 3. Pain Management: Tylenol as needed.   Controlled with meds on 10/25 4. Mood: Lexapro 20 mg daily             -antipsychotic agents: N/A 5. Neuropsych: This patient is capable of making decisions on her own behalf. 6. Skin/Wound Care: Routine skin checks, add xeroform telfa and kerlix Left foot   Peripheral vasc disease vs small veseel embolic infarcts related to sepsis  Cyanotic left toes #1,2,3, and R #1, plan for VVS is to monitor  Demarcation and f/u as OP   10/18 post op sandal, continue local care  Skin looking irritated Left dorsum foot, will change xeroform to petroleum consider silvadene Right great toe dorsal blister rupture, plantar blister is still intact 7. Fluids/Electrolytes/Nutrition: Routine in and outs. 8.  Pulmonary sarcoidosis.  Patient on 2 L nasal cannula baseline.  Follow-up outpatient  Continue supplemental oxygen 9.  Hypertension.  Monitor with increased mobility. Well controlled.  Vitals:   08/13/20 2031 08/14/20 0510  BP:  122/69  Pulse:  98  Resp:  18  Temp:  98 F (36.7 C)  SpO2: 100% 100%   Controlled on 10/28 10.  Asthma.  Continue inhalers as directed 11.  Hyperlipidemia.  Lipitor 12.  Hypothyroidism.  Synthroid 13.  AKI.    Creatinine 1.02 on 10/13, repeat 1.17 yesterday  14.  Bilateral lower extremity ischemia.  No urgent indication for lower extremity angiogram at present.  Follow-up vascular surgery. 15.  Urinary retention.Resolved, voiding after foley removal  16.  Leukocytosis associated with bacteremia - resolved  17.  Constipation  Improving 18.   Acute blood loss anemia  Hemoglobin stable 19.  Thrombocytopenia  Platelets 130 on 10/18, resolved 150K yesterday  20.  Steroid-induced hyperglycemia  mild  LOS: 17 days A FACE TO FACE EVALUATION WAS PERFORMED  Charlett Blake 08/14/2020, 8:33 AM

## 2020-08-14 NOTE — Plan of Care (Signed)
  Problem: RH Balance Goal: LTG Patient will maintain dynamic sitting balance (PT) Description: LTG:  Patient will maintain dynamic sitting balance with assistance during mobility activities (PT) Outcome: Completed/Met Goal: LTG Patient will maintain dynamic standing balance (PT) Description: LTG:  Patient will maintain dynamic standing balance with assistance during mobility activities (PT) Outcome: Completed/Met   Problem: RH Bed Mobility Goal: LTG Patient will perform bed mobility with assist (PT) Description: LTG: Patient will perform bed mobility with assistance, with/without cues (PT). Outcome: Completed/Met   Problem: RH Bed to Chair Transfers Goal: LTG Patient will perform bed/chair transfers w/assist (PT) Description: LTG: Patient will perform bed to chair transfers with assistance (PT). Outcome: Completed/Met   Problem: RH Car Transfers Goal: LTG Patient will perform car transfers with assist (PT) Description: LTG: Patient will perform car transfers with assistance (PT). Outcome: Completed/Met   Problem: RH Ambulation Goal: LTG Patient will ambulate in controlled environment (PT) Description: LTG: Patient will ambulate in a controlled environment, # of feet with assistance (PT). Outcome: Completed/Met   Problem: RH Wheelchair Mobility Goal: LTG Patient will propel w/c in controlled environment (PT) Description: LTG: Patient will propel wheelchair in controlled environment, # of feet with assist (PT) Outcome: Completed/Met Goal: LTG Patient will propel w/c in home environment (PT) Description: LTG: Patient will propel wheelchair in home environment, # of feet with assistance (PT). Outcome: Completed/Met   Problem: RH Stairs Goal: LTG Patient will ambulate up and down stairs w/assist (PT) Description: LTG: Patient will ambulate up and down # of stairs with assistance (PT) Outcome: Completed/Met

## 2020-08-15 DIAGNOSIS — I2721 Secondary pulmonary arterial hypertension: Secondary | ICD-10-CM | POA: Diagnosis not present

## 2020-08-15 DIAGNOSIS — M359 Systemic involvement of connective tissue, unspecified: Secondary | ICD-10-CM | POA: Diagnosis not present

## 2020-08-17 DIAGNOSIS — Z4800 Encounter for change or removal of nonsurgical wound dressing: Secondary | ICD-10-CM | POA: Diagnosis not present

## 2020-08-17 DIAGNOSIS — E039 Hypothyroidism, unspecified: Secondary | ICD-10-CM | POA: Diagnosis not present

## 2020-08-17 DIAGNOSIS — R339 Retention of urine, unspecified: Secondary | ICD-10-CM | POA: Diagnosis not present

## 2020-08-17 DIAGNOSIS — I1310 Hypertensive heart and chronic kidney disease without heart failure, with stage 1 through stage 4 chronic kidney disease, or unspecified chronic kidney disease: Secondary | ICD-10-CM | POA: Diagnosis not present

## 2020-08-17 DIAGNOSIS — N179 Acute kidney failure, unspecified: Secondary | ICD-10-CM | POA: Diagnosis not present

## 2020-08-17 DIAGNOSIS — I70223 Atherosclerosis of native arteries of extremities with rest pain, bilateral legs: Secondary | ICD-10-CM | POA: Diagnosis not present

## 2020-08-17 DIAGNOSIS — I088 Other rheumatic multiple valve diseases: Secondary | ICD-10-CM | POA: Diagnosis not present

## 2020-08-17 DIAGNOSIS — R739 Hyperglycemia, unspecified: Secondary | ICD-10-CM | POA: Diagnosis not present

## 2020-08-17 DIAGNOSIS — D860 Sarcoidosis of lung: Secondary | ICD-10-CM | POA: Diagnosis not present

## 2020-08-17 DIAGNOSIS — D62 Acute posthemorrhagic anemia: Secondary | ICD-10-CM | POA: Diagnosis not present

## 2020-08-17 DIAGNOSIS — J45909 Unspecified asthma, uncomplicated: Secondary | ICD-10-CM | POA: Diagnosis not present

## 2020-08-17 DIAGNOSIS — N189 Chronic kidney disease, unspecified: Secondary | ICD-10-CM | POA: Diagnosis not present

## 2020-08-17 DIAGNOSIS — D696 Thrombocytopenia, unspecified: Secondary | ICD-10-CM | POA: Diagnosis not present

## 2020-08-17 DIAGNOSIS — I272 Pulmonary hypertension, unspecified: Secondary | ICD-10-CM | POA: Diagnosis not present

## 2020-08-17 DIAGNOSIS — H9190 Unspecified hearing loss, unspecified ear: Secondary | ICD-10-CM | POA: Diagnosis not present

## 2020-08-17 DIAGNOSIS — T380X5D Adverse effect of glucocorticoids and synthetic analogues, subsequent encounter: Secondary | ICD-10-CM | POA: Diagnosis not present

## 2020-08-18 ENCOUNTER — Telehealth: Payer: Self-pay | Admitting: Registered Nurse

## 2020-08-18 DIAGNOSIS — H9190 Unspecified hearing loss, unspecified ear: Secondary | ICD-10-CM | POA: Diagnosis not present

## 2020-08-18 DIAGNOSIS — Z4800 Encounter for change or removal of nonsurgical wound dressing: Secondary | ICD-10-CM | POA: Diagnosis not present

## 2020-08-18 DIAGNOSIS — D62 Acute posthemorrhagic anemia: Secondary | ICD-10-CM | POA: Diagnosis not present

## 2020-08-18 DIAGNOSIS — N189 Chronic kidney disease, unspecified: Secondary | ICD-10-CM | POA: Diagnosis not present

## 2020-08-18 DIAGNOSIS — R739 Hyperglycemia, unspecified: Secondary | ICD-10-CM | POA: Diagnosis not present

## 2020-08-18 DIAGNOSIS — R339 Retention of urine, unspecified: Secondary | ICD-10-CM | POA: Diagnosis not present

## 2020-08-18 DIAGNOSIS — E039 Hypothyroidism, unspecified: Secondary | ICD-10-CM | POA: Diagnosis not present

## 2020-08-18 DIAGNOSIS — I272 Pulmonary hypertension, unspecified: Secondary | ICD-10-CM | POA: Diagnosis not present

## 2020-08-18 DIAGNOSIS — D860 Sarcoidosis of lung: Secondary | ICD-10-CM | POA: Diagnosis not present

## 2020-08-18 DIAGNOSIS — N179 Acute kidney failure, unspecified: Secondary | ICD-10-CM | POA: Diagnosis not present

## 2020-08-18 DIAGNOSIS — D696 Thrombocytopenia, unspecified: Secondary | ICD-10-CM | POA: Diagnosis not present

## 2020-08-18 DIAGNOSIS — J45909 Unspecified asthma, uncomplicated: Secondary | ICD-10-CM | POA: Diagnosis not present

## 2020-08-18 DIAGNOSIS — T380X5D Adverse effect of glucocorticoids and synthetic analogues, subsequent encounter: Secondary | ICD-10-CM | POA: Diagnosis not present

## 2020-08-18 DIAGNOSIS — I1310 Hypertensive heart and chronic kidney disease without heart failure, with stage 1 through stage 4 chronic kidney disease, or unspecified chronic kidney disease: Secondary | ICD-10-CM | POA: Diagnosis not present

## 2020-08-18 DIAGNOSIS — I088 Other rheumatic multiple valve diseases: Secondary | ICD-10-CM | POA: Diagnosis not present

## 2020-08-18 DIAGNOSIS — I70223 Atherosclerosis of native arteries of extremities with rest pain, bilateral legs: Secondary | ICD-10-CM | POA: Diagnosis not present

## 2020-08-18 NOTE — Telephone Encounter (Signed)
Transitional Care call Transitional Questions answered by husband: Dr. Kathyrn Sheriff  Patient name: Stacie [name] DOB: 1956-09-30 1. Are you/is patient experiencing any problems since coming home? No a. Are there any questions regarding any aspect of care? NO 2. Are there any questions regarding medications administration/dosing? no a. Are meds being taken as prescribed? YES b. "Patient should review meds with caller to confirm" Medication List  Reviewed 3. Have there been any falls? No 4. Has Home Health been to the house and/or have they contacted you? Yes: [facility] a. If not, have you tried to contact them? NA b. Can we help you contact them? NA 5. Are bowels and bladder emptying properly? Yes a. Are there any unexpected incontinence issues? No b. If applicable, is patient following bowel/bladder programs? NA 6. Any fevers, problems with breathing, unexpected pain? No 7. Are there any skin problems or new areas of breakdown? No 8. Has the patient/family member arranged specialty MD follow up (ie cardiology/neurology/renal/surgical/etc.)Dr. Kathyrn Sheriff states he will follow up with neurology in [place], he won't be following up with [facility] Neurology he sates. All other HFU appointments are scheduled he reports.  a. Can we help arrange? No 9. Does the patient need any other services or support that we can help arrange? No 10. Are caregivers following through as expected in assisting the patient? Yes 11. Has the patient quit smoking, drinking alcohol, or using drugs as recommended? (                        )  Appointment date/time 08/25/2020  arrival time 11:00 for 11:20 appointment with Dr Ranell Patrick. At  [place]

## 2020-08-19 DIAGNOSIS — R739 Hyperglycemia, unspecified: Secondary | ICD-10-CM | POA: Diagnosis not present

## 2020-08-19 DIAGNOSIS — T380X5D Adverse effect of glucocorticoids and synthetic analogues, subsequent encounter: Secondary | ICD-10-CM | POA: Diagnosis not present

## 2020-08-19 DIAGNOSIS — D62 Acute posthemorrhagic anemia: Secondary | ICD-10-CM | POA: Diagnosis not present

## 2020-08-19 DIAGNOSIS — E039 Hypothyroidism, unspecified: Secondary | ICD-10-CM | POA: Diagnosis not present

## 2020-08-19 DIAGNOSIS — N189 Chronic kidney disease, unspecified: Secondary | ICD-10-CM | POA: Diagnosis not present

## 2020-08-19 DIAGNOSIS — D860 Sarcoidosis of lung: Secondary | ICD-10-CM | POA: Diagnosis not present

## 2020-08-19 DIAGNOSIS — I272 Pulmonary hypertension, unspecified: Secondary | ICD-10-CM | POA: Diagnosis not present

## 2020-08-19 DIAGNOSIS — J45909 Unspecified asthma, uncomplicated: Secondary | ICD-10-CM | POA: Diagnosis not present

## 2020-08-19 DIAGNOSIS — R339 Retention of urine, unspecified: Secondary | ICD-10-CM | POA: Diagnosis not present

## 2020-08-19 DIAGNOSIS — I088 Other rheumatic multiple valve diseases: Secondary | ICD-10-CM | POA: Diagnosis not present

## 2020-08-19 DIAGNOSIS — I70223 Atherosclerosis of native arteries of extremities with rest pain, bilateral legs: Secondary | ICD-10-CM | POA: Diagnosis not present

## 2020-08-19 DIAGNOSIS — H9190 Unspecified hearing loss, unspecified ear: Secondary | ICD-10-CM | POA: Diagnosis not present

## 2020-08-19 DIAGNOSIS — N179 Acute kidney failure, unspecified: Secondary | ICD-10-CM | POA: Diagnosis not present

## 2020-08-19 DIAGNOSIS — D696 Thrombocytopenia, unspecified: Secondary | ICD-10-CM | POA: Diagnosis not present

## 2020-08-19 DIAGNOSIS — Z4800 Encounter for change or removal of nonsurgical wound dressing: Secondary | ICD-10-CM | POA: Diagnosis not present

## 2020-08-19 DIAGNOSIS — I1310 Hypertensive heart and chronic kidney disease without heart failure, with stage 1 through stage 4 chronic kidney disease, or unspecified chronic kidney disease: Secondary | ICD-10-CM | POA: Diagnosis not present

## 2020-08-20 DIAGNOSIS — H9190 Unspecified hearing loss, unspecified ear: Secondary | ICD-10-CM | POA: Diagnosis not present

## 2020-08-20 DIAGNOSIS — D860 Sarcoidosis of lung: Secondary | ICD-10-CM | POA: Diagnosis not present

## 2020-08-20 DIAGNOSIS — J45909 Unspecified asthma, uncomplicated: Secondary | ICD-10-CM | POA: Diagnosis not present

## 2020-08-20 DIAGNOSIS — N179 Acute kidney failure, unspecified: Secondary | ICD-10-CM | POA: Diagnosis not present

## 2020-08-20 DIAGNOSIS — I70223 Atherosclerosis of native arteries of extremities with rest pain, bilateral legs: Secondary | ICD-10-CM | POA: Diagnosis not present

## 2020-08-20 DIAGNOSIS — D62 Acute posthemorrhagic anemia: Secondary | ICD-10-CM | POA: Diagnosis not present

## 2020-08-20 DIAGNOSIS — T380X5D Adverse effect of glucocorticoids and synthetic analogues, subsequent encounter: Secondary | ICD-10-CM | POA: Diagnosis not present

## 2020-08-20 DIAGNOSIS — I1310 Hypertensive heart and chronic kidney disease without heart failure, with stage 1 through stage 4 chronic kidney disease, or unspecified chronic kidney disease: Secondary | ICD-10-CM | POA: Diagnosis not present

## 2020-08-20 DIAGNOSIS — Z4800 Encounter for change or removal of nonsurgical wound dressing: Secondary | ICD-10-CM | POA: Diagnosis not present

## 2020-08-20 DIAGNOSIS — I088 Other rheumatic multiple valve diseases: Secondary | ICD-10-CM | POA: Diagnosis not present

## 2020-08-20 DIAGNOSIS — I272 Pulmonary hypertension, unspecified: Secondary | ICD-10-CM | POA: Diagnosis not present

## 2020-08-20 DIAGNOSIS — N189 Chronic kidney disease, unspecified: Secondary | ICD-10-CM | POA: Diagnosis not present

## 2020-08-20 DIAGNOSIS — R739 Hyperglycemia, unspecified: Secondary | ICD-10-CM | POA: Diagnosis not present

## 2020-08-20 DIAGNOSIS — D696 Thrombocytopenia, unspecified: Secondary | ICD-10-CM | POA: Diagnosis not present

## 2020-08-20 DIAGNOSIS — E039 Hypothyroidism, unspecified: Secondary | ICD-10-CM | POA: Diagnosis not present

## 2020-08-20 DIAGNOSIS — R339 Retention of urine, unspecified: Secondary | ICD-10-CM | POA: Diagnosis not present

## 2020-08-22 DIAGNOSIS — I1310 Hypertensive heart and chronic kidney disease without heart failure, with stage 1 through stage 4 chronic kidney disease, or unspecified chronic kidney disease: Secondary | ICD-10-CM | POA: Diagnosis not present

## 2020-08-22 DIAGNOSIS — R339 Retention of urine, unspecified: Secondary | ICD-10-CM | POA: Diagnosis not present

## 2020-08-22 DIAGNOSIS — I272 Pulmonary hypertension, unspecified: Secondary | ICD-10-CM | POA: Diagnosis not present

## 2020-08-22 DIAGNOSIS — T380X5D Adverse effect of glucocorticoids and synthetic analogues, subsequent encounter: Secondary | ICD-10-CM | POA: Diagnosis not present

## 2020-08-22 DIAGNOSIS — E039 Hypothyroidism, unspecified: Secondary | ICD-10-CM | POA: Diagnosis not present

## 2020-08-22 DIAGNOSIS — Z4800 Encounter for change or removal of nonsurgical wound dressing: Secondary | ICD-10-CM | POA: Diagnosis not present

## 2020-08-22 DIAGNOSIS — D696 Thrombocytopenia, unspecified: Secondary | ICD-10-CM | POA: Diagnosis not present

## 2020-08-22 DIAGNOSIS — D62 Acute posthemorrhagic anemia: Secondary | ICD-10-CM | POA: Diagnosis not present

## 2020-08-22 DIAGNOSIS — N189 Chronic kidney disease, unspecified: Secondary | ICD-10-CM | POA: Diagnosis not present

## 2020-08-22 DIAGNOSIS — N179 Acute kidney failure, unspecified: Secondary | ICD-10-CM | POA: Diagnosis not present

## 2020-08-22 DIAGNOSIS — D860 Sarcoidosis of lung: Secondary | ICD-10-CM | POA: Diagnosis not present

## 2020-08-22 DIAGNOSIS — H9190 Unspecified hearing loss, unspecified ear: Secondary | ICD-10-CM | POA: Diagnosis not present

## 2020-08-22 DIAGNOSIS — R739 Hyperglycemia, unspecified: Secondary | ICD-10-CM | POA: Diagnosis not present

## 2020-08-22 DIAGNOSIS — J45909 Unspecified asthma, uncomplicated: Secondary | ICD-10-CM | POA: Diagnosis not present

## 2020-08-22 DIAGNOSIS — I70223 Atherosclerosis of native arteries of extremities with rest pain, bilateral legs: Secondary | ICD-10-CM | POA: Diagnosis not present

## 2020-08-22 DIAGNOSIS — I088 Other rheumatic multiple valve diseases: Secondary | ICD-10-CM | POA: Diagnosis not present

## 2020-08-24 ENCOUNTER — Encounter (INDEPENDENT_AMBULATORY_CARE_PROVIDER_SITE_OTHER): Payer: Self-pay | Admitting: Nurse Practitioner

## 2020-08-24 ENCOUNTER — Ambulatory Visit (INDEPENDENT_AMBULATORY_CARE_PROVIDER_SITE_OTHER): Payer: BC Managed Care – PPO | Admitting: Nurse Practitioner

## 2020-08-24 ENCOUNTER — Other Ambulatory Visit: Payer: Self-pay

## 2020-08-24 VITALS — BP 126/81 | HR 99 | Resp 16 | Wt 176.0 lb

## 2020-08-24 DIAGNOSIS — E782 Mixed hyperlipidemia: Secondary | ICD-10-CM | POA: Diagnosis not present

## 2020-08-24 DIAGNOSIS — T380X5D Adverse effect of glucocorticoids and synthetic analogues, subsequent encounter: Secondary | ICD-10-CM | POA: Diagnosis not present

## 2020-08-24 DIAGNOSIS — I70229 Atherosclerosis of native arteries of extremities with rest pain, unspecified extremity: Secondary | ICD-10-CM

## 2020-08-24 DIAGNOSIS — I70223 Atherosclerosis of native arteries of extremities with rest pain, bilateral legs: Secondary | ICD-10-CM | POA: Diagnosis not present

## 2020-08-24 DIAGNOSIS — D696 Thrombocytopenia, unspecified: Secondary | ICD-10-CM | POA: Diagnosis not present

## 2020-08-24 DIAGNOSIS — D860 Sarcoidosis of lung: Secondary | ICD-10-CM | POA: Diagnosis not present

## 2020-08-24 DIAGNOSIS — R739 Hyperglycemia, unspecified: Secondary | ICD-10-CM | POA: Diagnosis not present

## 2020-08-24 DIAGNOSIS — R339 Retention of urine, unspecified: Secondary | ICD-10-CM | POA: Diagnosis not present

## 2020-08-24 DIAGNOSIS — I272 Pulmonary hypertension, unspecified: Secondary | ICD-10-CM | POA: Diagnosis not present

## 2020-08-24 DIAGNOSIS — I1310 Hypertensive heart and chronic kidney disease without heart failure, with stage 1 through stage 4 chronic kidney disease, or unspecified chronic kidney disease: Secondary | ICD-10-CM | POA: Diagnosis not present

## 2020-08-24 DIAGNOSIS — Z4800 Encounter for change or removal of nonsurgical wound dressing: Secondary | ICD-10-CM | POA: Diagnosis not present

## 2020-08-24 DIAGNOSIS — N179 Acute kidney failure, unspecified: Secondary | ICD-10-CM | POA: Diagnosis not present

## 2020-08-24 DIAGNOSIS — G4733 Obstructive sleep apnea (adult) (pediatric): Secondary | ICD-10-CM | POA: Diagnosis not present

## 2020-08-24 DIAGNOSIS — E039 Hypothyroidism, unspecified: Secondary | ICD-10-CM | POA: Diagnosis not present

## 2020-08-24 DIAGNOSIS — J45909 Unspecified asthma, uncomplicated: Secondary | ICD-10-CM | POA: Diagnosis not present

## 2020-08-24 DIAGNOSIS — N189 Chronic kidney disease, unspecified: Secondary | ICD-10-CM | POA: Diagnosis not present

## 2020-08-24 DIAGNOSIS — D62 Acute posthemorrhagic anemia: Secondary | ICD-10-CM | POA: Diagnosis not present

## 2020-08-24 DIAGNOSIS — I088 Other rheumatic multiple valve diseases: Secondary | ICD-10-CM | POA: Diagnosis not present

## 2020-08-24 DIAGNOSIS — H9190 Unspecified hearing loss, unspecified ear: Secondary | ICD-10-CM | POA: Diagnosis not present

## 2020-08-25 ENCOUNTER — Other Ambulatory Visit: Payer: Self-pay

## 2020-08-25 ENCOUNTER — Encounter: Payer: BC Managed Care – PPO | Admitting: Physical Medicine and Rehabilitation

## 2020-08-25 DIAGNOSIS — R339 Retention of urine, unspecified: Secondary | ICD-10-CM | POA: Diagnosis not present

## 2020-08-25 DIAGNOSIS — N179 Acute kidney failure, unspecified: Secondary | ICD-10-CM | POA: Diagnosis not present

## 2020-08-25 DIAGNOSIS — I70223 Atherosclerosis of native arteries of extremities with rest pain, bilateral legs: Secondary | ICD-10-CM | POA: Diagnosis not present

## 2020-08-25 DIAGNOSIS — R739 Hyperglycemia, unspecified: Secondary | ICD-10-CM | POA: Diagnosis not present

## 2020-08-25 DIAGNOSIS — T380X5D Adverse effect of glucocorticoids and synthetic analogues, subsequent encounter: Secondary | ICD-10-CM | POA: Diagnosis not present

## 2020-08-25 DIAGNOSIS — J45909 Unspecified asthma, uncomplicated: Secondary | ICD-10-CM | POA: Diagnosis not present

## 2020-08-25 DIAGNOSIS — N189 Chronic kidney disease, unspecified: Secondary | ICD-10-CM | POA: Diagnosis not present

## 2020-08-25 DIAGNOSIS — H9190 Unspecified hearing loss, unspecified ear: Secondary | ICD-10-CM | POA: Diagnosis not present

## 2020-08-25 DIAGNOSIS — Z4800 Encounter for change or removal of nonsurgical wound dressing: Secondary | ICD-10-CM | POA: Diagnosis not present

## 2020-08-25 DIAGNOSIS — I1310 Hypertensive heart and chronic kidney disease without heart failure, with stage 1 through stage 4 chronic kidney disease, or unspecified chronic kidney disease: Secondary | ICD-10-CM | POA: Diagnosis not present

## 2020-08-25 DIAGNOSIS — I088 Other rheumatic multiple valve diseases: Secondary | ICD-10-CM | POA: Diagnosis not present

## 2020-08-25 DIAGNOSIS — N390 Urinary tract infection, site not specified: Secondary | ICD-10-CM

## 2020-08-25 DIAGNOSIS — D696 Thrombocytopenia, unspecified: Secondary | ICD-10-CM | POA: Diagnosis not present

## 2020-08-25 DIAGNOSIS — E039 Hypothyroidism, unspecified: Secondary | ICD-10-CM | POA: Diagnosis not present

## 2020-08-25 DIAGNOSIS — D62 Acute posthemorrhagic anemia: Secondary | ICD-10-CM | POA: Diagnosis not present

## 2020-08-25 DIAGNOSIS — I272 Pulmonary hypertension, unspecified: Secondary | ICD-10-CM | POA: Diagnosis not present

## 2020-08-25 DIAGNOSIS — D860 Sarcoidosis of lung: Secondary | ICD-10-CM | POA: Diagnosis not present

## 2020-08-26 ENCOUNTER — Ambulatory Visit: Payer: BC Managed Care – PPO | Admitting: Urology

## 2020-08-26 ENCOUNTER — Other Ambulatory Visit: Payer: Self-pay

## 2020-08-26 ENCOUNTER — Other Ambulatory Visit: Payer: BC Managed Care – PPO

## 2020-08-26 ENCOUNTER — Telehealth: Payer: Self-pay

## 2020-08-26 DIAGNOSIS — N390 Urinary tract infection, site not specified: Secondary | ICD-10-CM

## 2020-08-26 MED ORDER — AMOXICILLIN-POT CLAVULANATE 875-125 MG PO TABS: 1.0000 | ORAL_TABLET | Freq: Two times a day (BID) | ORAL | 0 refills | Status: AC

## 2020-08-26 NOTE — Telephone Encounter (Signed)
-----  Message from Billey Co, MD sent at 08/26/2020 11:48 AM EST ----- Regarding: UTI Urinalysis concerning for UTI, and we will send for culture.  Please send Augmentin [phone] twice daily x5 days to her pharmacy please and let them know.  Thanks.  Nickolas Madrid, MD 08/26/2020

## 2020-08-26 NOTE — Telephone Encounter (Signed)
Called pt informed her of the information below. Pt voiced understanding. RX sent.

## 2020-08-27 DIAGNOSIS — E039 Hypothyroidism, unspecified: Secondary | ICD-10-CM | POA: Diagnosis not present

## 2020-08-27 DIAGNOSIS — N179 Acute kidney failure, unspecified: Secondary | ICD-10-CM | POA: Diagnosis not present

## 2020-08-27 DIAGNOSIS — J45909 Unspecified asthma, uncomplicated: Secondary | ICD-10-CM | POA: Diagnosis not present

## 2020-08-27 DIAGNOSIS — T380X5D Adverse effect of glucocorticoids and synthetic analogues, subsequent encounter: Secondary | ICD-10-CM | POA: Diagnosis not present

## 2020-08-27 DIAGNOSIS — H9190 Unspecified hearing loss, unspecified ear: Secondary | ICD-10-CM | POA: Diagnosis not present

## 2020-08-27 DIAGNOSIS — D696 Thrombocytopenia, unspecified: Secondary | ICD-10-CM | POA: Diagnosis not present

## 2020-08-27 DIAGNOSIS — Z4800 Encounter for change or removal of nonsurgical wound dressing: Secondary | ICD-10-CM | POA: Diagnosis not present

## 2020-08-27 DIAGNOSIS — D62 Acute posthemorrhagic anemia: Secondary | ICD-10-CM | POA: Diagnosis not present

## 2020-08-27 DIAGNOSIS — S91302A Unspecified open wound, left foot, initial encounter: Secondary | ICD-10-CM | POA: Diagnosis not present

## 2020-08-27 DIAGNOSIS — I70223 Atherosclerosis of native arteries of extremities with rest pain, bilateral legs: Secondary | ICD-10-CM | POA: Diagnosis not present

## 2020-08-27 DIAGNOSIS — R339 Retention of urine, unspecified: Secondary | ICD-10-CM | POA: Diagnosis not present

## 2020-08-27 DIAGNOSIS — D860 Sarcoidosis of lung: Secondary | ICD-10-CM | POA: Diagnosis not present

## 2020-08-27 DIAGNOSIS — I1310 Hypertensive heart and chronic kidney disease without heart failure, with stage 1 through stage 4 chronic kidney disease, or unspecified chronic kidney disease: Secondary | ICD-10-CM | POA: Diagnosis not present

## 2020-08-27 DIAGNOSIS — I088 Other rheumatic multiple valve diseases: Secondary | ICD-10-CM | POA: Diagnosis not present

## 2020-08-27 DIAGNOSIS — R739 Hyperglycemia, unspecified: Secondary | ICD-10-CM | POA: Diagnosis not present

## 2020-08-27 DIAGNOSIS — N189 Chronic kidney disease, unspecified: Secondary | ICD-10-CM | POA: Diagnosis not present

## 2020-08-27 DIAGNOSIS — I272 Pulmonary hypertension, unspecified: Secondary | ICD-10-CM | POA: Diagnosis not present

## 2020-08-27 LAB — URINALYSIS, COMPLETE
Bilirubin, UA: NEGATIVE
Glucose, UA: NEGATIVE
Ketones, UA: NEGATIVE
Nitrite, UA: POSITIVE — AB
Specific Gravity, UA: 1.02 (ref 1.005–1.030)
Urobilinogen, Ur: 0.2 mg/dL (ref 0.2–1.0)
pH, UA: 7 (ref 5.0–7.5)

## 2020-08-27 LAB — MICROSCOPIC EXAMINATION
RBC, Urine: >30 /hpf — AB (ref 0–2)
WBC, UA: >30 /hpf — AB (ref 0–5)

## 2020-08-28 ENCOUNTER — Encounter (INDEPENDENT_AMBULATORY_CARE_PROVIDER_SITE_OTHER): Payer: Self-pay | Admitting: Nurse Practitioner

## 2020-08-28 NOTE — Progress Notes (Signed)
Subjective:    Patient ID: Stacie [name], female    DOB: 1956-03-08, 64 y.o.   MRN: [id number] Chief Complaint  Patient presents with  . New Patient (Initial Visit)    ref [place] follow up    Stacie [name] is a 64 y.o. female that presents to the office today for follow-up after recent admission to [facility].  The patient has a previous medical history of pulmonary sarcoidosis (the patient is on 2 L nasal cannula at baseline), pulmonary hypertension, asthma, hypertension, IBS, hypothyroidism and chronic kidney disease.  She initially presented due to altered mental status as well as shortness of breath.  The patient was also found to have bilateral lower extremity as well.  The patient was ultimately found to have sepsis due to UTI caused by kidney stones.  The patient required intubation and suffered a watershed stroke during hospitalization.  Since the patient has returned home the patient does not have any long-term effects of hospitalization.  The patient has regained her memory(except for portions where she was intubated), her baseline mobility levels, and she is alert and oriented x4.  During the patient's hospitalization the decision was made not to proceed with angiogram due to the possible further damage to her kidneys caused by the dye, so the patient's physical exam improved during hospitalization.  Currently, the patient's gangrenous changes have been stable.  There has been no extensive drainage or signs symptoms of infection.  There has been some skin sloughing in certain areas which is displayed by redness near the anterior portion of the foot (see picture).  Overall, the patient continues to progressively improve daily.   Review of Systems  Skin: Positive for color change and wound.  All other systems reviewed and are negative.      Objective:   Physical Exam Vitals reviewed.  HENT:     Head: Normocephalic.  Cardiovascular:     Rate and Rhythm:  Normal rate.     Pulses: Normal pulses.  Pulmonary:     Effort: Pulmonary effort is normal.     Comments: Home 02 Skin:    Comments: See photos  Neurological:     Mental Status: She is alert and oriented to person, place, and time. Mental status is at baseline.     Motor: Weakness present.  Psychiatric:        Mood and Affect: Mood normal.        Behavior: Behavior normal.        Thought Content: Thought content normal.        Judgment: Judgment normal.           BP 126/81 (BP Location: Right Arm)   Pulse 99   Resp 16   Wt 176 lb (79.8 kg)   BMI 26.76 kg/m   Past Medical History:  Diagnosis Date  . Arrhythmia    patient unaware if this is current  . Asthma   . GERD (gastroesophageal reflux disease)   . Heart murmur   . History of kidney stones   . HOH (hard of hearing)    wear aids  . Hypothyroidism   . IBS (irritable bowel syndrome)   . Pulmonary hypertension ([place])   . Sarcoid   . Sarcoidosis   . Seasonal allergies   . Sleep apnea CPAP with O2  . Wears hearing aid in both ears     Social History   Socioeconomic History  . Marital status: Married    Spouse name:  Not on file  . Number of children: Not on file  . Years of education: Not on file  . Highest education level: Not on file  Occupational History  . Occupation: Homemaker  Tobacco Use  . Smoking status: Never Smoker  . Smokeless tobacco: Never Used  Vaping Use  . Vaping Use: Never used  Substance and Sexual Activity  . Alcohol use: No  . Drug use: No  . Sexual activity: Not on file  Other Topics Concern  . Not on file  Social History Narrative  . Not on file   Social Determinants of Health   Financial Resource Strain:   . Difficulty of Paying Living Expenses: Not on file  Food Insecurity:   . Worried About Charity fundraiser in the Last Year: Not on file  . Ran Out of Food in the Last Year: Not on file  Transportation Needs:   . Lack of Transportation (Medical): Not on file  .  Lack of Transportation (Non-Medical): Not on file  Physical Activity:   . Days of Exercise per Week: Not on file  . Minutes of Exercise per Session: Not on file  Stress:   . Feeling of Stress : Not on file  Social Connections:   . Frequency of Communication with Friends and Family: Not on file  . Frequency of Social Gatherings with Friends and Family: Not on file  . Attends Religious Services: Not on file  . Active Member of Clubs or Organizations: Not on file  . Attends Archivist Meetings: Not on file  . Marital Status: Not on file  Intimate Partner Violence:   . Fear of Current or Ex-Partner: Not on file  . Emotionally Abused: Not on file  . Physically Abused: Not on file  . Sexually Abused: Not on file    Past Surgical History:  Procedure Laterality Date  . CARDIAC CATHETERIZATION  10/18/2018   [facility]  . CATARACT EXTRACTION W/PHACO Left 07/31/2019   Procedure: CATARACT EXTRACTION PHACO AND INTRAOCULAR LENS PLACEMENT (IOC) LEFT 00:51.1  17.9%  9.15;  Surgeon: Leandrew Koyanagi, MD;  Location: [facility];  Service: Ophthalmology;  Laterality: Left;  keep this patient second  . COLON SURGERY     "colon was fused to bladder - operated on both"  . COLONOSCOPY  09/18/2007   diverticuli, no polyps  . COLONOSCOPY  05/26/2010   diverticuli, no polyps  . CYSTOSCOPY WITH STENT PLACEMENT Bilateral 07/14/2020   Procedure: CYSTOSCOPY WITH STENT PLACEMENT, RETROPYLOGRAM;  Surgeon: Billey Co, MD;  Location: [facility];  Service: Urology;  Laterality: Bilateral;  . CYSTOSCOPY/URETEROSCOPY/HOLMIUM LASER/STENT PLACEMENT Left 02/20/2020   Procedure: CYSTOSCOPY/URETEROSCOPY/LITHOTRIPSY /STENT PLACEMENT;  Surgeon: Hollice Espy, MD;  Location: [facility];  Service: Urology;  Laterality: Left;  . PARS PLANA VITRECTOMY Right 05/20/2015   Procedure: PARS PLANA VITRECTOMY WITH 25 GAUGE, laser;  Surgeon: Milus Height, MD;  Location: [facility];  Service: Ophthalmology;   Laterality: Right;  . PARTIAL HYSTERECTOMY  1990  . TEE WITHOUT CARDIOVERSION N/A 07/27/2020   Procedure: TRANSESOPHAGEAL ECHOCARDIOGRAM (TEE);  Surgeon: Teodoro Spray, MD;  Location: [facility];  Service: Cardiovascular;  Laterality: N/A;  . TUBAL LIGATION      Family History  Problem Relation Age of Onset  . Allergies Father   . Asthma Father   . Colon cancer Father   . Allergies Brother   . Asthma Brother   . Breast cancer Maternal Grandmother     Allergies  Allergen Reactions  .  Nitrofurantoin Nausea Only  . Tramadol     Nausea and vomiting  . Sulfa Antibiotics Rash    As an infant    CBC Latest Ref Rng & Units 08/10/2020 08/03/2020 07/29/2020  WBC 4.0 - 10.5 K/uL 5.5 6.0 13.3(H)  Hemoglobin 12.0 - 15.0 g/dL 9.8(L) 10.1(L) 10.9(L)  Hematocrit 36 - 46 % 31.7(L) 32.2(L) 35.0(L)  Platelets 150 - 400 K/uL 151 130(L) 167      CMP     Component Value Date/Time   NA 142 08/10/2020 0844   NA 144 05/27/2019 0947   NA 142 12/28/2012 0454   K 4.0 08/10/2020 0844   K 3.4 (L) 12/28/2012 0454   CL 100 08/10/2020 0844   CL 111 (H) 12/28/2012 0454   CO2 32 08/10/2020 0844   CO2 27 12/28/2012 0454   GLUCOSE 128 (H) 08/10/2020 0844   GLUCOSE 95 12/28/2012 0454   BUN 26 (H) 08/10/2020 0844   BUN 21 05/27/2019 0947   BUN 8 12/28/2012 0454   CREATININE 1.17 (H) 08/10/2020 0844   CREATININE 1.14 12/28/2012 0454   CALCIUM 9.4 08/10/2020 0844   CALCIUM 8.0 (L) 12/28/2012 0454   PROT 5.5 (L) 07/29/2020 0709   PROT 6.9 05/27/2019 0947   PROT 6.9 12/26/2012 1657   ALBUMIN 2.4 (L) 07/29/2020 0709   ALBUMIN 4.1 05/27/2019 0947   ALBUMIN 3.0 (L) 12/26/2012 1657   AST 44 (H) 07/29/2020 0709   AST 25 12/26/2012 1657   ALT 59 (H) 07/29/2020 0709   ALT 20 12/26/2012 1657   ALKPHOS 73 07/29/2020 0709   ALKPHOS 80 12/26/2012 1657   BILITOT 0.6 07/29/2020 0709   BILITOT 0.3 05/27/2019 0947   BILITOT 0.6 12/26/2012 1657   GFRNONAA 52 (L) 08/10/2020 0844   GFRNONAA 53 (L)  12/28/2012 0454   GFRAA 56 (L) 07/21/2020 0617   GFRAA >60 12/28/2012 0454     No results found.     Assessment & Plan:   1. Critical lower limb ischemia (HCC) Currently it appears that the patient is recovering is doing well overall.  In order to allow the patient to further recover it is planned that she will have intervention to her kidney stone sometime in January or February of next year.  We plan on intervening on her ischemic areas prior to this.  Out of an abundance of caution we will do bilateral arterial duplexes to ensure that the patient has adequate perfusion for wound healing.  On the patient's right foot it is possible that the great toe may auto amputate, which will require no further surgical intervention.  However, due to the progression on the patient's left foot, depending on how far the gangrenous changes progress, she may require a transmetatarsal amputation.  We will plan for follow-up in 1 month and reassess what intervention may be necessary.  However if the patient begins to develop signs and symptoms of infection, copious drainage or foul-smelling odors he should contact her office for sooner follow-up.  2. Hyperlipidemia, mixed Continue statin as ordered and reviewed, no changes at this time    Current Outpatient Medications on File Prior to Visit  Medication Sig Dispense Refill  . acetaminophen (TYLENOL) 325 MG tablet Take 2 tablets (650 mg total) by mouth every 4 (four) hours as needed for mild pain or fever.    Marland Kitchen aspirin EC 81 MG EC tablet Take 1 tablet (81 mg total) by mouth daily. Swallow whole. 30 tablet 11  . atorvastatin (LIPITOR) 40 MG tablet  Take 1 tablet (40 mg total) by mouth daily. 30 tablet 0  . bisacodyl (DULCOLAX) 10 MG suppository Place 1 suppository (10 mg total) rectally daily as needed for moderate constipation. 12 suppository 0  . budesonide-formoterol (SYMBICORT) 80-4.5 MCG/ACT inhaler Take 2 puffs first thing in am and then another 2 puffs  about 12 hours later. 6.9 g 1  . denosumab (PROLIA) 60 MG/ML SOSY injection Inject 60 mg into the skin every 6 (six) months.    . docusate (COLACE) 50 MG/5ML liquid Take 10 mLs (100 mg total) by mouth 2 (two) times daily. 100 mL 0  . dorzolamide (TRUSOPT) 2 % ophthalmic solution Place 1 drop into both eyes 2 (two) times daily.    Marland Kitchen escitalopram (LEXAPRO) 20 MG tablet Take 1 tablet (20 mg total) by mouth daily. 30 tablet 0  . folic acid (FOLVITE) 1 MG tablet Take 1 tablet (1 mg total) by mouth daily. 30 tablet 11  . latanoprost (XALATAN) 0.005 % ophthalmic solution Place 1 drop into the right eye at bedtime.    Marland Kitchen levothyroxine (SYNTHROID) 137 MCG tablet TAKE 1 TABLET (137 MCG TOTAL) BY MOUTH DAILY BEFORE BREAKFAST. 90 tablet 0  . methotrexate (RHEUMATREX) 5 MG tablet Take 5 tablets (25 mg total) by mouth once a week. Caution:Chemotherapy. Protect from light. 4 tablet 0  . Multiple Vitamins-Minerals (MULTIVITAMIN WITH MINERALS) tablet Take 1 tablet by mouth daily.    . NON FORMULARY CPAP nightly    . OXYGEN Inhale 2 L into the lungs as needed.    . pantoprazole (PROTONIX) 40 MG tablet Take 1 tablet (40 mg total) by mouth daily. 30 tablet 0  . polyethylene glycol (MIRALAX / GLYCOLAX) 17 g packet Take 17 g by mouth daily as needed for moderate constipation. 14 each 0  . predniSONE (DELTASONE) 5 MG tablet Take 3 tablets (15 mg total) by mouth daily with breakfast. 60 tablet 0  . tamsulosin (FLOMAX) 0.4 MG CAPS capsule Take 1 capsule (0.4 mg total) by mouth daily. 30 capsule 0  . Treprostinil (TYVASO) 0.6 MG/ML SOLN Inhale 18 mcg into the lungs 4 (four) times daily. 12 breaths 4 times a day     No current facility-administered medications on file prior to visit.    There are no Patient Instructions on file for this visit. No follow-ups on file.   Kris Hartmann, NP

## 2020-08-29 LAB — CULTURE, URINE COMPREHENSIVE

## 2020-08-30 ENCOUNTER — Encounter: Payer: Self-pay | Admitting: Urology

## 2020-08-30 NOTE — Progress Notes (Signed)
Called by answering service. Ongoing urinary tract symptoms, previously prescribed antibiotics not sensitive. Will change antibiotics to Cipro 500 mg twice daily x5 days.  Lmom at pharmacy, contacted patient via my chart.

## 2020-08-31 ENCOUNTER — Telehealth: Payer: Self-pay

## 2020-08-31 NOTE — Telephone Encounter (Signed)
Pt read and confirmed message via mychart.

## 2020-08-31 NOTE — Telephone Encounter (Signed)
-----  Message from Billey Co, MD sent at 08/31/2020  8:58 AM EST ----- Please confirm patient changed over to culture appropriate cipro per note from AB yesterday, thanks  Nickolas Madrid, MD 08/31/2020

## 2020-09-01 ENCOUNTER — Other Ambulatory Visit (INDEPENDENT_AMBULATORY_CARE_PROVIDER_SITE_OTHER): Payer: BC Managed Care – PPO | Admitting: Internal Medicine

## 2020-09-01 DIAGNOSIS — I1310 Hypertensive heart and chronic kidney disease without heart failure, with stage 1 through stage 4 chronic kidney disease, or unspecified chronic kidney disease: Secondary | ICD-10-CM

## 2020-09-01 DIAGNOSIS — T380X5D Adverse effect of glucocorticoids and synthetic analogues, subsequent encounter: Secondary | ICD-10-CM

## 2020-09-01 DIAGNOSIS — D62 Acute posthemorrhagic anemia: Secondary | ICD-10-CM

## 2020-09-01 DIAGNOSIS — I272 Pulmonary hypertension, unspecified: Secondary | ICD-10-CM

## 2020-09-01 DIAGNOSIS — D860 Sarcoidosis of lung: Secondary | ICD-10-CM | POA: Diagnosis not present

## 2020-09-01 DIAGNOSIS — I70223 Atherosclerosis of native arteries of extremities with rest pain, bilateral legs: Secondary | ICD-10-CM

## 2020-09-01 DIAGNOSIS — I088 Other rheumatic multiple valve diseases: Secondary | ICD-10-CM

## 2020-09-01 DIAGNOSIS — Z4800 Encounter for change or removal of nonsurgical wound dressing: Secondary | ICD-10-CM

## 2020-09-01 DIAGNOSIS — Z7982 Long term (current) use of aspirin: Secondary | ICD-10-CM

## 2020-09-01 DIAGNOSIS — G4733 Obstructive sleep apnea (adult) (pediatric): Secondary | ICD-10-CM

## 2020-09-01 DIAGNOSIS — E782 Mixed hyperlipidemia: Secondary | ICD-10-CM

## 2020-09-01 DIAGNOSIS — N179 Acute kidney failure, unspecified: Secondary | ICD-10-CM | POA: Diagnosis not present

## 2020-09-01 DIAGNOSIS — H9190 Unspecified hearing loss, unspecified ear: Secondary | ICD-10-CM | POA: Diagnosis not present

## 2020-09-01 DIAGNOSIS — K219 Gastro-esophageal reflux disease without esophagitis: Secondary | ICD-10-CM

## 2020-09-01 DIAGNOSIS — N183 Chronic kidney disease, stage 3 unspecified: Secondary | ICD-10-CM

## 2020-09-01 DIAGNOSIS — K766 Portal hypertension: Secondary | ICD-10-CM

## 2020-09-01 DIAGNOSIS — R739 Hyperglycemia, unspecified: Secondary | ICD-10-CM

## 2020-09-01 DIAGNOSIS — N202 Calculus of kidney with calculus of ureter: Secondary | ICD-10-CM

## 2020-09-01 DIAGNOSIS — Z9981 Dependence on supplemental oxygen: Secondary | ICD-10-CM

## 2020-09-01 DIAGNOSIS — R339 Retention of urine, unspecified: Secondary | ICD-10-CM | POA: Diagnosis not present

## 2020-09-01 DIAGNOSIS — D696 Thrombocytopenia, unspecified: Secondary | ICD-10-CM | POA: Diagnosis not present

## 2020-09-01 DIAGNOSIS — J45909 Unspecified asthma, uncomplicated: Secondary | ICD-10-CM | POA: Diagnosis not present

## 2020-09-01 DIAGNOSIS — Z9181 History of falling: Secondary | ICD-10-CM

## 2020-09-01 DIAGNOSIS — K581 Irritable bowel syndrome with constipation: Secondary | ICD-10-CM

## 2020-09-01 DIAGNOSIS — J4540 Moderate persistent asthma, uncomplicated: Secondary | ICD-10-CM

## 2020-09-01 DIAGNOSIS — E039 Hypothyroidism, unspecified: Secondary | ICD-10-CM

## 2020-09-01 DIAGNOSIS — N189 Chronic kidney disease, unspecified: Secondary | ICD-10-CM | POA: Diagnosis not present

## 2020-09-01 NOTE — Progress Notes (Signed)
Received orders from [facility]. Start of care 08/17/20.   Orders from 08/17/20 through 10/15/20 are reviewed, signed and faxed.

## 2020-09-02 ENCOUNTER — Other Ambulatory Visit: Payer: Self-pay | Admitting: Internal Medicine

## 2020-09-02 MED ORDER — ATORVASTATIN CALCIUM 40 MG PO TABS
40.0000 mg | ORAL_TABLET | Freq: Every day | ORAL | 0 refills | Status: DC
Start: 2020-09-02 — End: 2020-09-30

## 2020-09-02 NOTE — Telephone Encounter (Signed)
Pt was in the Hospital due to a stroke and was prescribed atorvastatin (LIPITOR) 40 MG tablet / Pt has 2 pills left and needs a refill./pt scheduled a follow up ppt for 11.30.21/ please advise

## 2020-09-04 DIAGNOSIS — D860 Sarcoidosis of lung: Secondary | ICD-10-CM | POA: Diagnosis not present

## 2020-09-04 DIAGNOSIS — I1310 Hypertensive heart and chronic kidney disease without heart failure, with stage 1 through stage 4 chronic kidney disease, or unspecified chronic kidney disease: Secondary | ICD-10-CM | POA: Diagnosis not present

## 2020-09-04 DIAGNOSIS — I272 Pulmonary hypertension, unspecified: Secondary | ICD-10-CM | POA: Diagnosis not present

## 2020-09-04 DIAGNOSIS — Z4800 Encounter for change or removal of nonsurgical wound dressing: Secondary | ICD-10-CM | POA: Diagnosis not present

## 2020-09-04 DIAGNOSIS — I70223 Atherosclerosis of native arteries of extremities with rest pain, bilateral legs: Secondary | ICD-10-CM | POA: Diagnosis not present

## 2020-09-04 DIAGNOSIS — T380X5D Adverse effect of glucocorticoids and synthetic analogues, subsequent encounter: Secondary | ICD-10-CM | POA: Diagnosis not present

## 2020-09-04 DIAGNOSIS — D62 Acute posthemorrhagic anemia: Secondary | ICD-10-CM | POA: Diagnosis not present

## 2020-09-04 DIAGNOSIS — E039 Hypothyroidism, unspecified: Secondary | ICD-10-CM | POA: Diagnosis not present

## 2020-09-04 DIAGNOSIS — N179 Acute kidney failure, unspecified: Secondary | ICD-10-CM | POA: Diagnosis not present

## 2020-09-04 DIAGNOSIS — I088 Other rheumatic multiple valve diseases: Secondary | ICD-10-CM | POA: Diagnosis not present

## 2020-09-04 DIAGNOSIS — N189 Chronic kidney disease, unspecified: Secondary | ICD-10-CM | POA: Diagnosis not present

## 2020-09-04 DIAGNOSIS — R739 Hyperglycemia, unspecified: Secondary | ICD-10-CM | POA: Diagnosis not present

## 2020-09-04 DIAGNOSIS — D696 Thrombocytopenia, unspecified: Secondary | ICD-10-CM | POA: Diagnosis not present

## 2020-09-04 DIAGNOSIS — J45909 Unspecified asthma, uncomplicated: Secondary | ICD-10-CM | POA: Diagnosis not present

## 2020-09-04 DIAGNOSIS — R339 Retention of urine, unspecified: Secondary | ICD-10-CM | POA: Diagnosis not present

## 2020-09-04 DIAGNOSIS — H9190 Unspecified hearing loss, unspecified ear: Secondary | ICD-10-CM | POA: Diagnosis not present

## 2020-09-07 ENCOUNTER — Encounter: Payer: Self-pay | Admitting: Internal Medicine

## 2020-09-07 ENCOUNTER — Other Ambulatory Visit (INDEPENDENT_AMBULATORY_CARE_PROVIDER_SITE_OTHER): Payer: Self-pay | Admitting: Nurse Practitioner

## 2020-09-07 DIAGNOSIS — H9190 Unspecified hearing loss, unspecified ear: Secondary | ICD-10-CM | POA: Diagnosis not present

## 2020-09-07 DIAGNOSIS — J45909 Unspecified asthma, uncomplicated: Secondary | ICD-10-CM | POA: Diagnosis not present

## 2020-09-07 DIAGNOSIS — R739 Hyperglycemia, unspecified: Secondary | ICD-10-CM | POA: Diagnosis not present

## 2020-09-07 DIAGNOSIS — E039 Hypothyroidism, unspecified: Secondary | ICD-10-CM | POA: Diagnosis not present

## 2020-09-07 DIAGNOSIS — N189 Chronic kidney disease, unspecified: Secondary | ICD-10-CM | POA: Diagnosis not present

## 2020-09-07 DIAGNOSIS — Z4800 Encounter for change or removal of nonsurgical wound dressing: Secondary | ICD-10-CM | POA: Diagnosis not present

## 2020-09-07 DIAGNOSIS — D696 Thrombocytopenia, unspecified: Secondary | ICD-10-CM | POA: Diagnosis not present

## 2020-09-07 DIAGNOSIS — I088 Other rheumatic multiple valve diseases: Secondary | ICD-10-CM | POA: Diagnosis not present

## 2020-09-07 DIAGNOSIS — D62 Acute posthemorrhagic anemia: Secondary | ICD-10-CM | POA: Diagnosis not present

## 2020-09-07 DIAGNOSIS — I70229 Atherosclerosis of native arteries of extremities with rest pain, unspecified extremity: Secondary | ICD-10-CM

## 2020-09-07 DIAGNOSIS — R339 Retention of urine, unspecified: Secondary | ICD-10-CM | POA: Diagnosis not present

## 2020-09-07 DIAGNOSIS — D860 Sarcoidosis of lung: Secondary | ICD-10-CM | POA: Diagnosis not present

## 2020-09-07 DIAGNOSIS — N179 Acute kidney failure, unspecified: Secondary | ICD-10-CM | POA: Diagnosis not present

## 2020-09-07 DIAGNOSIS — T380X5D Adverse effect of glucocorticoids and synthetic analogues, subsequent encounter: Secondary | ICD-10-CM | POA: Diagnosis not present

## 2020-09-07 DIAGNOSIS — I1310 Hypertensive heart and chronic kidney disease without heart failure, with stage 1 through stage 4 chronic kidney disease, or unspecified chronic kidney disease: Secondary | ICD-10-CM | POA: Diagnosis not present

## 2020-09-07 DIAGNOSIS — I70223 Atherosclerosis of native arteries of extremities with rest pain, bilateral legs: Secondary | ICD-10-CM | POA: Diagnosis not present

## 2020-09-07 DIAGNOSIS — I272 Pulmonary hypertension, unspecified: Secondary | ICD-10-CM | POA: Diagnosis not present

## 2020-09-12 DIAGNOSIS — I2721 Secondary pulmonary arterial hypertension: Secondary | ICD-10-CM | POA: Diagnosis not present

## 2020-09-12 DIAGNOSIS — M359 Systemic involvement of connective tissue, unspecified: Secondary | ICD-10-CM | POA: Diagnosis not present

## 2020-09-14 ENCOUNTER — Other Ambulatory Visit: Payer: Self-pay

## 2020-09-14 ENCOUNTER — Telehealth: Payer: Self-pay

## 2020-09-14 ENCOUNTER — Ambulatory Visit (INDEPENDENT_AMBULATORY_CARE_PROVIDER_SITE_OTHER): Payer: BC Managed Care – PPO | Admitting: Nurse Practitioner

## 2020-09-14 ENCOUNTER — Other Ambulatory Visit (INDEPENDENT_AMBULATORY_CARE_PROVIDER_SITE_OTHER): Payer: Self-pay | Admitting: Nurse Practitioner

## 2020-09-14 ENCOUNTER — Encounter (INDEPENDENT_AMBULATORY_CARE_PROVIDER_SITE_OTHER): Payer: Self-pay | Admitting: Nurse Practitioner

## 2020-09-14 ENCOUNTER — Ambulatory Visit (INDEPENDENT_AMBULATORY_CARE_PROVIDER_SITE_OTHER): Payer: BC Managed Care – PPO

## 2020-09-14 VITALS — BP 139/71 | HR 109 | Ht 68.0 in | Wt 174.0 lb

## 2020-09-14 DIAGNOSIS — I70229 Atherosclerosis of native arteries of extremities with rest pain, unspecified extremity: Secondary | ICD-10-CM

## 2020-09-14 DIAGNOSIS — E782 Mixed hyperlipidemia: Secondary | ICD-10-CM | POA: Diagnosis not present

## 2020-09-14 DIAGNOSIS — I6340 Cerebral infarction due to embolism of unspecified cerebral artery: Secondary | ICD-10-CM | POA: Diagnosis not present

## 2020-09-14 NOTE — Telephone Encounter (Signed)
Please Advise.  KP

## 2020-09-14 NOTE — Telephone Encounter (Signed)
Copied from [place] [id number]. Topic: General - Inquiry >> Sep 09, 2020  3:49 PM Gillis Ends D wrote: Reason for CRM: [employer] called wanting to get permission to give the patient a substitution for a product on back order. Xero form was ordered and was going to be substitute with Adaptic.If you have any questions they can be reached at  [phone] Monday -Friday (8am to 4:30pm).

## 2020-09-14 NOTE — Telephone Encounter (Signed)
You can call and okay the substitution.

## 2020-09-15 ENCOUNTER — Ambulatory Visit: Payer: BC Managed Care – PPO | Admitting: Internal Medicine

## 2020-09-15 DIAGNOSIS — S91302A Unspecified open wound, left foot, initial encounter: Secondary | ICD-10-CM | POA: Diagnosis not present

## 2020-09-15 NOTE — Telephone Encounter (Signed)
[place] to approve the substitution. [employer] will send the alternate medication.  KP

## 2020-09-16 ENCOUNTER — Other Ambulatory Visit: Payer: Self-pay

## 2020-09-16 ENCOUNTER — Ambulatory Visit (INDEPENDENT_AMBULATORY_CARE_PROVIDER_SITE_OTHER): Payer: BC Managed Care – PPO | Admitting: Urology

## 2020-09-16 ENCOUNTER — Encounter: Payer: Self-pay | Admitting: Urology

## 2020-09-16 VITALS — BP 141/76 | HR 114 | Ht 68.0 in | Wt 172.0 lb

## 2020-09-16 DIAGNOSIS — H9190 Unspecified hearing loss, unspecified ear: Secondary | ICD-10-CM | POA: Diagnosis not present

## 2020-09-16 DIAGNOSIS — D860 Sarcoidosis of lung: Secondary | ICD-10-CM | POA: Diagnosis not present

## 2020-09-16 DIAGNOSIS — D696 Thrombocytopenia, unspecified: Secondary | ICD-10-CM | POA: Diagnosis not present

## 2020-09-16 DIAGNOSIS — E039 Hypothyroidism, unspecified: Secondary | ICD-10-CM | POA: Diagnosis not present

## 2020-09-16 DIAGNOSIS — N179 Acute kidney failure, unspecified: Secondary | ICD-10-CM | POA: Diagnosis not present

## 2020-09-16 DIAGNOSIS — I272 Pulmonary hypertension, unspecified: Secondary | ICD-10-CM | POA: Diagnosis not present

## 2020-09-16 DIAGNOSIS — N390 Urinary tract infection, site not specified: Secondary | ICD-10-CM | POA: Diagnosis not present

## 2020-09-16 DIAGNOSIS — I1310 Hypertensive heart and chronic kidney disease without heart failure, with stage 1 through stage 4 chronic kidney disease, or unspecified chronic kidney disease: Secondary | ICD-10-CM | POA: Diagnosis not present

## 2020-09-16 DIAGNOSIS — R739 Hyperglycemia, unspecified: Secondary | ICD-10-CM | POA: Diagnosis not present

## 2020-09-16 DIAGNOSIS — D62 Acute posthemorrhagic anemia: Secondary | ICD-10-CM | POA: Diagnosis not present

## 2020-09-16 DIAGNOSIS — R339 Retention of urine, unspecified: Secondary | ICD-10-CM | POA: Diagnosis not present

## 2020-09-16 DIAGNOSIS — J45909 Unspecified asthma, uncomplicated: Secondary | ICD-10-CM | POA: Diagnosis not present

## 2020-09-16 DIAGNOSIS — N189 Chronic kidney disease, unspecified: Secondary | ICD-10-CM | POA: Diagnosis not present

## 2020-09-16 DIAGNOSIS — I088 Other rheumatic multiple valve diseases: Secondary | ICD-10-CM | POA: Diagnosis not present

## 2020-09-16 DIAGNOSIS — N200 Calculus of kidney: Secondary | ICD-10-CM

## 2020-09-16 DIAGNOSIS — Z4800 Encounter for change or removal of nonsurgical wound dressing: Secondary | ICD-10-CM | POA: Diagnosis not present

## 2020-09-16 DIAGNOSIS — I70223 Atherosclerosis of native arteries of extremities with rest pain, bilateral legs: Secondary | ICD-10-CM | POA: Diagnosis not present

## 2020-09-16 DIAGNOSIS — T380X5D Adverse effect of glucocorticoids and synthetic analogues, subsequent encounter: Secondary | ICD-10-CM | POA: Diagnosis not present

## 2020-09-16 NOTE — Progress Notes (Signed)
09/16/2020 9:14 AM   Nomi A Prettyman [birth year] [id number]  Reason for visit: Follow up bilateral ureteral stones, sepsis from urinary source status post bilateral ureteral stenting  HPI: She is a 64 year old female with history of sarcoidosis and history of recurrent nephrolithiasis who was admitted in September 2021 with Klebsiella bacteremia from urinary source and ultimately was found to have bilateral ureteral stones on CT(8 mm right proximal, 5 mm left distal).  She was treated with emergent stent placement and antibiotics, and continues to improved after a prolonged hospitalization.  Urine and blood culture ultimately grew Klebsiella  We discussed the need for definitive management of her stones with bilateral ureteroscopy, laser lithotripsy, stent exchange.  We also discussed at length the risks and benefits of timing, and that typically we do not want stents to stay in more than 3 to 4 months secondary to risk of infection.  She has had 1 culture proven UTI on 08/26/2020 with Hafnia Alvei, and it was treated with culture appropriate Cipro.  She denies any urinary symptoms or flank pain today.  She has some irritative symptoms from the ureteral stents.  We specifically discussed the risks ureteroscopy including bleeding, infection/sepsis, stent related symptoms including flank pain/urgency/frequency/incontinence/dysuria, ureteral injury, inability to access stone, or need for staged or additional procedures.  Schedule bilateral ureteroscopy, laser lithotripsy, stent exchange on 12/17 Pre-op urinalysis and culture sent today Will need 95-FMBB urine metabolic work-up and follow-up  Billey Co, MD  [facility] [address] [place], [place] [address] [phone]

## 2020-09-16 NOTE — H&P (View-Only) (Signed)
09/16/2020 9:14 AM   Stacie [name] [birth year] [id number]  Reason for visit: Follow up bilateral ureteral stones, sepsis from urinary source status post bilateral ureteral stenting  HPI: She is a 64 year old female with history of sarcoidosis and history of recurrent nephrolithiasis who was admitted in September 2021 with Klebsiella bacteremia from urinary source and ultimately was found to have bilateral ureteral stones on CT(8 mm right proximal, 5 mm left distal).  She was treated with emergent stent placement and antibiotics, and continues to improved after a prolonged hospitalization.  Urine and blood culture ultimately grew Klebsiella  We discussed the need for definitive management of her stones with bilateral ureteroscopy, laser lithotripsy, stent exchange.  We also discussed at length the risks and benefits of timing, and that typically we do not want stents to stay in more than 3 to 4 months secondary to risk of infection.  She has had 1 culture proven UTI on 08/26/2020 with Hafnia Alvei, and it was treated with culture appropriate Cipro.  She denies any urinary symptoms or flank pain today.  She has some irritative symptoms from the ureteral stents.  We specifically discussed the risks ureteroscopy including bleeding, infection/sepsis, stent related symptoms including flank pain/urgency/frequency/incontinence/dysuria, ureteral injury, inability to access stone, or need for staged or additional procedures.  Schedule bilateral ureteroscopy, laser lithotripsy, stent exchange on 12/17 Pre-op urinalysis and culture sent today Will need 68-HFGB urine metabolic work-up and follow-up  Billey Co, MD  [facility] [address] [place], [place] [address] [phone]

## 2020-09-18 LAB — MICROSCOPIC EXAMINATION
RBC, Urine: >30 /hpf — AB (ref 0–2)
WBC, UA: >30 /hpf — AB (ref 0–5)

## 2020-09-18 LAB — URINALYSIS, COMPLETE
Bilirubin, UA: NEGATIVE
Glucose, UA: NEGATIVE
Ketones, UA: NEGATIVE
Nitrite, UA: NEGATIVE
Specific Gravity, UA: 1.025 (ref 1.005–1.030)
Urobilinogen, Ur: 0.2 mg/dL (ref 0.2–1.0)
pH, UA: 6 (ref 5.0–7.5)

## 2020-09-19 DIAGNOSIS — I088 Other rheumatic multiple valve diseases: Secondary | ICD-10-CM | POA: Diagnosis not present

## 2020-09-19 DIAGNOSIS — N179 Acute kidney failure, unspecified: Secondary | ICD-10-CM | POA: Diagnosis not present

## 2020-09-19 DIAGNOSIS — R739 Hyperglycemia, unspecified: Secondary | ICD-10-CM | POA: Diagnosis not present

## 2020-09-19 DIAGNOSIS — D62 Acute posthemorrhagic anemia: Secondary | ICD-10-CM | POA: Diagnosis not present

## 2020-09-19 DIAGNOSIS — J45909 Unspecified asthma, uncomplicated: Secondary | ICD-10-CM | POA: Diagnosis not present

## 2020-09-19 DIAGNOSIS — Z4800 Encounter for change or removal of nonsurgical wound dressing: Secondary | ICD-10-CM | POA: Diagnosis not present

## 2020-09-19 DIAGNOSIS — D860 Sarcoidosis of lung: Secondary | ICD-10-CM | POA: Diagnosis not present

## 2020-09-19 DIAGNOSIS — E039 Hypothyroidism, unspecified: Secondary | ICD-10-CM | POA: Diagnosis not present

## 2020-09-19 DIAGNOSIS — I1310 Hypertensive heart and chronic kidney disease without heart failure, with stage 1 through stage 4 chronic kidney disease, or unspecified chronic kidney disease: Secondary | ICD-10-CM | POA: Diagnosis not present

## 2020-09-19 DIAGNOSIS — T380X5D Adverse effect of glucocorticoids and synthetic analogues, subsequent encounter: Secondary | ICD-10-CM | POA: Diagnosis not present

## 2020-09-19 DIAGNOSIS — I70223 Atherosclerosis of native arteries of extremities with rest pain, bilateral legs: Secondary | ICD-10-CM | POA: Diagnosis not present

## 2020-09-19 DIAGNOSIS — R339 Retention of urine, unspecified: Secondary | ICD-10-CM | POA: Diagnosis not present

## 2020-09-19 DIAGNOSIS — H9190 Unspecified hearing loss, unspecified ear: Secondary | ICD-10-CM | POA: Diagnosis not present

## 2020-09-19 DIAGNOSIS — D696 Thrombocytopenia, unspecified: Secondary | ICD-10-CM | POA: Diagnosis not present

## 2020-09-19 DIAGNOSIS — N189 Chronic kidney disease, unspecified: Secondary | ICD-10-CM | POA: Diagnosis not present

## 2020-09-19 DIAGNOSIS — I272 Pulmonary hypertension, unspecified: Secondary | ICD-10-CM | POA: Diagnosis not present

## 2020-09-19 LAB — CULTURE, URINE COMPREHENSIVE

## 2020-09-20 ENCOUNTER — Encounter (INDEPENDENT_AMBULATORY_CARE_PROVIDER_SITE_OTHER): Payer: Self-pay | Admitting: Nurse Practitioner

## 2020-09-20 NOTE — Progress Notes (Signed)
Subjective:    Patient ID: Stacie [name], female    DOB: December 30, 1955, 64 y.o.   MRN: [id number] Chief Complaint  Patient presents with  . Follow-up    U/s follow up    Stacie [name] is a 64 y.o. female that presents to the office today for follow-up after recent admission to [facility].  The patient has a previous medical history of pulmonary sarcoidosis (the patient is on 2 L nasal cannula at baseline), pulmonary hypertension, asthma, hypertension, IBS, hypothyroidism and chronic kidney disease.  She initially presented due to altered mental status as well as shortness of breath.  The patient was also found to have bilateral lower extremity ischemia as well.  The patient was ultimately found to have sepsis due to UTI caused by kidney stones.  The patient required intubation and suffered a watershed stroke during hospitalization.  Since the patient has returned home the patient does not have any long-term effects of hospitalization.  The patient has regained her memory(except for portions where she was intubated), her baseline mobility levels, and she is alert and oriented x4.  Since the patient's last office visit she has had a UTI which will likely result in intervention to her remaining kidney stones sooner than previously planned.  The esophageal portions of the left foot have improved somewhat however significant across x-rays of the left foot as well.  Fortunately there is no no pain and no signs symptoms of infection.  Today the patient underwent ABIs which shows an ABI of 1.28 on the left and 1.43 on the right.  The patient has strong triphasic tibial artery waveforms bilaterally with absent toe waveforms in the right great digit with dampened toe waveforms on the second and third digit whereas the fourth and fifth abnormal waveforms.  The left foot second through fifth digit waveforms were not obtained due to extensive gangrene bandaging.  There is no waveforms  present in the left great toe.   Review of Systems  Musculoskeletal: Positive for gait problem.  Skin: Positive for color change and wound.  All other systems reviewed and are negative.      Objective:   Physical Exam Vitals reviewed.  HENT:     Head: Normocephalic.  Cardiovascular:     Rate and Rhythm: Normal rate.     Pulses:          Dorsalis pedis pulses are 2+ on the right side and 2+ on the left side.       Posterior tibial pulses are 2+ on the right side and 1+ on the left side.  Pulmonary:     Comments: Home 02 Skin:    General: Skin is warm and dry.  Neurological:     Mental Status: She is oriented to person, place, and time.  Psychiatric:        Mood and Affect: Mood normal.        Behavior: Behavior normal.        Thought Content: Thought content normal.        Judgment: Judgment normal.     BP 139/71   Pulse (!) 109   Ht 5' 8" (1.727 m)   Wt 174 lb (78.9 kg)   BMI 26.46 kg/m   Past Medical History:  Diagnosis Date  . Arrhythmia    patient unaware if this is current  . Asthma   . GERD (gastroesophageal reflux disease)   . Heart murmur   . History of kidney stones   .  HOH (hard of hearing)    wear aids  . Hypothyroidism   . IBS (irritable bowel syndrome)   . Pulmonary hypertension ([place])   . Sarcoid   . Sarcoidosis   . Seasonal allergies   . Sleep apnea CPAP with O2  . Wears hearing aid in both ears     Social History   Socioeconomic History  . Marital status: Married    Spouse name: Not on file  . Number of children: Not on file  . Years of education: Not on file  . Highest education level: Not on file  Occupational History  . Occupation: Homemaker  Tobacco Use  . Smoking status: Never Smoker  . Smokeless tobacco: Never Used  Vaping Use  . Vaping Use: Never used  Substance and Sexual Activity  . Alcohol use: No  . Drug use: No  . Sexual activity: Not on file  Other Topics Concern  . Not on file  Social History Narrative  .  Not on file   Social Determinants of Health   Financial Resource Strain:   . Difficulty of Paying Living Expenses: Not on file  Food Insecurity:   . Worried About Charity fundraiser in the Last Year: Not on file  . Ran Out of Food in the Last Year: Not on file  Transportation Needs:   . Lack of Transportation (Medical): Not on file  . Lack of Transportation (Non-Medical): Not on file  Physical Activity:   . Days of Exercise per Week: Not on file  . Minutes of Exercise per Session: Not on file  Stress:   . Feeling of Stress : Not on file  Social Connections:   . Frequency of Communication with Friends and Family: Not on file  . Frequency of Social Gatherings with Friends and Family: Not on file  . Attends Religious Services: Not on file  . Active Member of Clubs or Organizations: Not on file  . Attends Archivist Meetings: Not on file  . Marital Status: Not on file  Intimate Partner Violence:   . Fear of Current or Ex-Partner: Not on file  . Emotionally Abused: Not on file  . Physically Abused: Not on file  . Sexually Abused: Not on file    Past Surgical History:  Procedure Laterality Date  . CARDIAC CATHETERIZATION  10/18/2018   [facility]  . CATARACT EXTRACTION W/PHACO Left 07/31/2019   Procedure: CATARACT EXTRACTION PHACO AND INTRAOCULAR LENS PLACEMENT (IOC) LEFT 00:51.1  17.9%  9.15;  Surgeon: Leandrew Koyanagi, MD;  Location: [place];  Service: Ophthalmology;  Laterality: Left;  keep this patient second  . COLON SURGERY     "colon was fused to bladder - operated on both"  . COLONOSCOPY  09/18/2007   diverticuli, no polyps  . COLONOSCOPY  05/26/2010   diverticuli, no polyps  . CYSTOSCOPY WITH STENT PLACEMENT Bilateral 07/14/2020   Procedure: CYSTOSCOPY WITH STENT PLACEMENT, RETROPYLOGRAM;  Surgeon: Billey Co, MD;  Location: [facility];  Service: Urology;  Laterality: Bilateral;  . CYSTOSCOPY/URETEROSCOPY/HOLMIUM LASER/STENT PLACEMENT Left  02/20/2020   Procedure: CYSTOSCOPY/URETEROSCOPY/LITHOTRIPSY /STENT PLACEMENT;  Surgeon: Hollice Espy, MD;  Location: [facility];  Service: Urology;  Laterality: Left;  . PARS PLANA VITRECTOMY Right 05/20/2015   Procedure: PARS PLANA VITRECTOMY WITH 25 GAUGE, laser;  Surgeon: Milus Height, MD;  Location: [facility];  Service: Ophthalmology;  Laterality: Right;  . PARTIAL HYSTERECTOMY  1990  . TEE WITHOUT CARDIOVERSION N/A 07/27/2020   Procedure: TRANSESOPHAGEAL ECHOCARDIOGRAM (TEE);  Surgeon: Teodoro Spray, MD;  Location: [facility];  Service: Cardiovascular;  Laterality: N/A;  . TUBAL LIGATION      Family History  Problem Relation Age of Onset  . Allergies Father   . Asthma Father   . Colon cancer Father   . Allergies Brother   . Asthma Brother   . Breast cancer Maternal Grandmother     Allergies  Allergen Reactions  . Nitrofurantoin Nausea Only  . Tramadol     Nausea and vomiting  . Sulfa Antibiotics Rash    As an infant    CBC Latest Ref Rng & Units 08/10/2020 08/03/2020 07/29/2020  WBC 4.0 - 10.5 K/uL 5.5 6.0 13.3(H)  Hemoglobin 12.0 - 15.0 g/dL 9.8(L) 10.1(L) 10.9(L)  Hematocrit 36 - 46 % 31.7(L) 32.2(L) 35.0(L)  Platelets 150 - 400 K/uL 151 130(L) 167      CMP     Component Value Date/Time   NA 142 08/10/2020 0844   NA 144 05/27/2019 0947   NA 142 12/28/2012 0454   K 4.0 08/10/2020 0844   K 3.4 (L) 12/28/2012 0454   CL 100 08/10/2020 0844   CL 111 (H) 12/28/2012 0454   CO2 32 08/10/2020 0844   CO2 27 12/28/2012 0454   GLUCOSE 128 (H) 08/10/2020 0844   GLUCOSE 95 12/28/2012 0454   BUN 26 (H) 08/10/2020 0844   BUN 21 05/27/2019 0947   BUN 8 12/28/2012 0454   CREATININE 1.17 (H) 08/10/2020 0844   CREATININE 1.14 12/28/2012 0454   CALCIUM 9.4 08/10/2020 0844   CALCIUM 8.0 (L) 12/28/2012 0454   PROT 5.5 (L) 07/29/2020 0709   PROT 6.9 05/27/2019 0947   PROT 6.9 12/26/2012 1657   ALBUMIN 2.4 (L) 07/29/2020 0709   ALBUMIN 4.1 05/27/2019 0947   ALBUMIN 3.0  (L) 12/26/2012 1657   AST 44 (H) 07/29/2020 0709   AST 25 12/26/2012 1657   ALT 59 (H) 07/29/2020 0709   ALT 20 12/26/2012 1657   ALKPHOS 73 07/29/2020 0709   ALKPHOS 80 12/26/2012 1657   BILITOT 0.6 07/29/2020 0709   BILITOT 0.3 05/27/2019 0947   BILITOT 0.6 12/26/2012 1657   GFRNONAA 52 (L) 08/10/2020 0844   GFRNONAA 53 (L) 12/28/2012 0454   GFRAA 56 (L) 07/21/2020 0617   GFRAA >60 12/28/2012 0454     VAS [place] ABI WITH/WO TBI  Result Date: 09/17/2020 LOWER EXTREMITY DOPPLER STUDY Indications: Gangrene.  Performing Technologist: Blondell Reveal RT, RDMS, RVT  Examination Guidelines: A complete evaluation includes at minimum, Doppler waveform signals and systolic blood pressure reading at the level of bilateral brachial, anterior tibial, and posterior tibial arteries, when vessel segments are accessible. Bilateral testing is considered an integral [name] of a complete examination. Photoelectric Plethysmograph (PPG) waveforms and toe systolic pressure readings are included as required and additional duplex testing as needed. Limited examinations for reoccurring indications may be performed as noted.  ABI Findings: +--------+------------------+-----+---------+----------------+ Right   Rt Pressure (mmHg)IndexWaveform Comment          +--------+------------------+-----+---------+----------------+ [id number]                                              +--------+------------------+-----+---------+----------------+ ATA     232               1.43 triphasic                 +--------+------------------+-----+---------+----------------+  PTA                            triphasicnon-compressible +--------+------------------+-----+---------+----------------+ 2nd Toe 65                0.40 Abnormal                  +--------+------------------+-----+---------+----------------+ +---------+------------------+-----+---------+-------+ Left     Lt Pressure (mmHg)IndexWaveform Comment  +---------+------------------+-----+---------+-------+ Brachial 162                                     +---------+------------------+-----+---------+-------+ ATA      185               1.14 triphasic        +---------+------------------+-----+---------+-------+ PTA      207               1.28 triphasic        +---------+------------------+-----+---------+-------+ Great Toe                       Absent           +---------+------------------+-----+---------+-------+ TOES Findings: +----------+---------------+--------+-------+ Right ToesPressure (mmHg)WaveformComment +----------+---------------+--------+-------+ 1st Digit                Absent          +----------+---------------+--------+-------+ 2nd Digit                Abnormal        +----------+---------------+--------+-------+ 3rd Digit                Abnormal        +----------+---------------+--------+-------+ 4th Digit                Normal          +----------+---------------+--------+-------+ 5th Digit                Normal          +----------+---------------+--------+-------+ Left 2nd-5th digit PPG waveforms not obtained due to extensive left foot/toe gangrene and bandaging.  Bilateral tibial artery Doppler waveforms appear consistent with the previous duplex exam on 07/14/20 at [facility].  Summary: Right: Normal Doppler waveforms suggest normal perfusion to the right foot. The toe-brachial index of the 2nd toe is abnormal. Left: Resting left ankle-brachial index is within normal range. No evidence of significant left lower extremity arterial disease. No flow was adequately detected in the great toe.  *See table(s) above for measurements and observations.  Electronically signed by Hortencia Pilar MD on 09/17/2020 at 4:16:19 PM.    Final        Assessment & Plan:   1. Critical lower limb ischemia (HCC) Based upon the patient's noninvasive studies today the patient should have adequate perfusion to heal  lower extremity wounds.  The patient's right foot is mostly intact except for the necrotic area at the distal portion of the first toe.  The patient's left foot has much more extensive necrotic areas including the middle 2 toes with necrotic changes on the third toe.  There is also necrotic portions near the dorsal surface as well as the plantar surface.  The changes are improved since the previous office visit however a significant amount of necrotic tissue remains.  Fortunately there is still no signs symptoms of infection and the patient currently does not have pain.  As  long as the patient's necrotic areas remain dry with no evidence of infection or significant pain, we can continue to allow time for the wound to continue to heal.  We will have the patient return in 6 weeks for evaluation of progress or sooner if the wound displays any of the changes discussed above.  2. Hyperlipidemia, mixed Continue statin as ordered and reviewed, no changes at this time    Current Outpatient Medications on File Prior to Visit  Medication Sig Dispense Refill  . acetaminophen (TYLENOL) 325 MG tablet Take 2 tablets (650 mg total) by mouth every 4 (four) hours as needed for mild pain or fever.    Marland Kitchen aspirin EC 81 MG EC tablet Take 1 tablet (81 mg total) by mouth daily. Swallow whole. 30 tablet 11  . atorvastatin (LIPITOR) 40 MG tablet Take 1 tablet (40 mg total) by mouth daily. 30 tablet 0  . budesonide-formoterol (SYMBICORT) 80-4.5 MCG/ACT inhaler Take 2 puffs first thing in am and then another 2 puffs about 12 hours later. 6.9 g 1  . denosumab (PROLIA) 60 MG/ML SOSY injection Inject 60 mg into the skin every 6 (six) months.    . dorzolamide (TRUSOPT) 2 % ophthalmic solution Place 1 drop into both eyes 2 (two) times daily.    Marland Kitchen escitalopram (LEXAPRO) 20 MG tablet Take 1 tablet (20 mg total) by mouth daily. 30 tablet 0  . folic acid (FOLVITE) 1 MG tablet Take 1 tablet (1 mg total) by mouth daily. 30 tablet 11  .  latanoprost (XALATAN) 0.005 % ophthalmic solution Place 1 drop into the right eye at bedtime.    Marland Kitchen levothyroxine (SYNTHROID) 137 MCG tablet TAKE 1 TABLET (137 MCG TOTAL) BY MOUTH DAILY BEFORE BREAKFAST. 90 tablet 0  . methotrexate (RHEUMATREX) 5 MG tablet Take 5 tablets (25 mg total) by mouth once a week. Caution:Chemotherapy. Protect from light. 4 tablet 0  . Multiple Vitamins-Minerals (MULTIVITAMIN WITH MINERALS) tablet Take 1 tablet by mouth daily.    . NON FORMULARY CPAP nightly    . OXYGEN Inhale 2 L into the lungs as needed.    . predniSONE (DELTASONE) 5 MG tablet Take 3 tablets (15 mg total) by mouth daily with breakfast. 60 tablet 0  . Treprostinil (TYVASO) 0.6 MG/ML SOLN Inhale 18 mcg into the lungs 4 (four) times daily. 12 breaths 4 times a day     No current facility-administered medications on file prior to visit.    There are no Patient Instructions on file for this visit. No follow-ups on file.   Kris Hartmann, NP

## 2020-09-21 ENCOUNTER — Ambulatory Visit (INDEPENDENT_AMBULATORY_CARE_PROVIDER_SITE_OTHER): Payer: BC Managed Care – PPO | Admitting: Internal Medicine

## 2020-09-21 ENCOUNTER — Encounter: Payer: Self-pay | Admitting: Internal Medicine

## 2020-09-21 ENCOUNTER — Telehealth: Payer: Self-pay

## 2020-09-21 ENCOUNTER — Other Ambulatory Visit: Payer: Self-pay

## 2020-09-21 VITALS — BP 130/60 | Ht 68.0 in | Wt 172.0 lb

## 2020-09-21 DIAGNOSIS — I6340 Cerebral infarction due to embolism of unspecified cerebral artery: Secondary | ICD-10-CM | POA: Diagnosis not present

## 2020-09-21 DIAGNOSIS — I70229 Atherosclerosis of native arteries of extremities with rest pain, unspecified extremity: Secondary | ICD-10-CM

## 2020-09-21 DIAGNOSIS — I272 Pulmonary hypertension, unspecified: Secondary | ICD-10-CM

## 2020-09-21 DIAGNOSIS — D860 Sarcoidosis of lung: Secondary | ICD-10-CM

## 2020-09-21 DIAGNOSIS — F3341 Major depressive disorder, recurrent, in partial remission: Secondary | ICD-10-CM | POA: Diagnosis not present

## 2020-09-21 DIAGNOSIS — D649 Anemia, unspecified: Secondary | ICD-10-CM | POA: Diagnosis not present

## 2020-09-21 DIAGNOSIS — E039 Hypothyroidism, unspecified: Secondary | ICD-10-CM

## 2020-09-21 DIAGNOSIS — B3749 Other urogenital candidiasis: Secondary | ICD-10-CM

## 2020-09-21 DIAGNOSIS — N289 Disorder of kidney and ureter, unspecified: Secondary | ICD-10-CM

## 2020-09-21 MED ORDER — FLUCONAZOLE 100 MG PO TABS: ORAL_TABLET | ORAL | 0 refills | Status: DC

## 2020-09-21 NOTE — Progress Notes (Signed)
Date:  09/21/2020   Name:  Stacie [name]   DOB:  04-Nov-1955   MRN:  [id number]  This encounter was conducted via video encounter due to the need for social distancing in light of the Covid-19 pandemic.  The patient was correctly identified. She is located at home.  Daughter [name] is also present during the visit.  I advised that I am conducting the visit from a secure room in my office at [facility] clinic.   The limitations of this form of encounter were discussed with the patient and he/she agreed to proceed.  Some vital signs will be absent.  Chief Complaint: Cerebrovascular Accident (Follow up.), Depression (Follow up.), and Hypertension (Follow up.) Hospital and Rehab general follow up. Admitted to [facility] 07/09/20;  DISCHARGE DIAGNOSIS:  #Severe sepsis secondary to Klebsiella pneumonia/UTI #Right kidney obstructive calculi and non-obstructing calculi of left ureter status post surgical decompression with stent placement on 28 September #Acute renal failure in the setting of severe sepsis improved #Bilateral lower extremity ischemic foot/dry gangrene suspected embolic #Acute on chronic hypoxic respiratory failure secondary to acute exacerbation of pulmonary sarcoidosis/acute lung injury in the setting of severe sepsis-- on chronic oxygen #Metabolic encephalopathy-- resolved #Acute CVA appears embolic in the setting of severe sepsis #Oral candidiasis #Acute urinary retention-- patient has Foley catheter    Admit date: 07/28/2020 to Inpatient rehab Discharge date: 08/14/2020 to home  Discharge Diagnoses:  Principal Problem:   Cerebrovascular accident (CVA) due to embolism of cerebral artery ([place]) Active Problems:   Sepsis ([place])   Thrombocytopenia ([place])   Acute blood loss anemia   Slow transit constipation   AKI (acute kidney injury) ([place])   Supplemental oxygen dependent   Labile blood glucose   Steroid-induced hyperglycemia Hypertension Pulmonary sarcoidosis Urinary  retention Bilateral lower extremity ischemia  Hospital Course: Helem A Schecter was admitted to rehab 07/28/2020 for inpatient therapies to consist of PT, ST and OT at least three hours five days a week. Past admission physiatrist, therapy team and rehab RN have worked together to provide customized collaborative inpatient rehab.    Pertaining to patient's sepsis encephalopathy embolic infarcts remained stable low-dose aspirin therapy.  Subcutaneous heparin for DVT prophylaxis discontinued at discharge home. >> she is on low dose aspirin with minor nose bleeds.  She also sees some blood in her urine.  She has improved greatly with rehab.  Left leg is still slightly weak but the right one is back to baseline.  She is able to walk upstairs on her own.  No longer requires the use of a walker.   Mood stabilization with Lexapro emotional support provided.   >>she is very happy to be home; family is very supportive.  She has some days when she does not feel well and gets irritable but overall stable.  She did have a history of pulmonary sarcoidosis 2 L baseline nasal cannula of oxygen as well as chronic prednisone with methotrexate and follow-up outpatient.  >>she continues on O2 during the day with O2 and CPAP at night.  She has a good response to Tyvaso inhaled 4 times per day.   Blood pressures well controlled.   >>being monitored at home and by [facility] RN  Synthroid ongoing for hypothyroidism   Lipitor for hyperlipidemia.    AKI stable latest creatinine 1.17.  >>one month ago her GFR was up to 58 and 52 which was a significant improvement.   Bilateral lower extremity ischemia no urgent indication for lower extremity angiogram at  present she would follow-up vascular surgery.  She was seen by Vascular on 09/14/20 to follow with non invasive studies.  She is doing a dry dressing that is helping. Per that visit:  >>Based upon the patient's noninvasive studies today the patient should have adequate  perfusion to heal lower extremity wounds.  The patient's right foot is mostly intact except for the necrotic area at the distal portion of the first toe.  The patient's left foot has much more extensive necrotic areas including the middle 2 toes with necrotic changes on the third toe.  There is also necrotic portions near the dorsal surface as well as the plantar surface.  The changes are improved since the previous office visit however a significant amount of necrotic tissue remains.  Fortunately there is still no signs symptoms of infection and the patient currently does not have pain.  As long as the patient's necrotic areas remain dry with no evidence of infection or significant pain, we can continue to allow time for the wound to continue to heal.  Initial urinary retention Foley tube removed voiding trial without difficulty.  >>She has bilateral stents which are scheduled to be removed followed by lithotripsy on 12/17.  Medications at discharge 1.  Tylenol as needed 2.  Continue nebulizer as directed 3.  Aspirin 81 mg p.o. daily 4.  Lipitor 40 mg p.o. daily 5.  Colace 100 mg p.o. twice daily 6.  Trusopt ophthalmic solution 1 drop both eyes twice daily 7.  Lexapro 20 mg p.o. daily 8.xalatan ophthalmic solution 1 drop right eye nightly 9.  Synthroid 137 mcg p.o. daily 10.  Rheumatrex 25 mg weekly 11.  Multivitamin daily 12.  Protonix 40 mg p.o. daily 13.  Prednisone 15 mg p.o. breakfast 14.  Flomax 0.4 mg p.o. daily 15.Tyvaso 18 mcg 4 times daily   HPI  Lab Results  Component Value Date   CREATININE 1.17 (H) 08/10/2020   BUN 26 (H) 08/10/2020   NA 142 08/10/2020   K 4.0 08/10/2020   CL 100 08/10/2020   CO2 32 08/10/2020   Lab Results  Component Value Date   CHOL 289 (H) 07/21/2020   HDL 33 (L) 07/21/2020   LDLCALC 198 (H) 07/21/2020   TRIG 290 (H) 07/21/2020   CHOLHDL 8.8 07/21/2020   Lab Results  Component Value Date   TSH 0.332 (L) 07/10/2020   Lab Results   Component Value Date   HGBA1C 6.4 (H) 07/16/2020   Lab Results  Component Value Date   WBC 5.5 08/10/2020   HGB 9.8 (L) 08/10/2020   HCT 31.7 (L) 08/10/2020   MCV 103.3 (H) 08/10/2020   PLT 151 08/10/2020   Lab Results  Component Value Date   ALT 59 (H) 07/29/2020   AST 44 (H) 07/29/2020   ALKPHOS 73 07/29/2020   BILITOT 0.6 07/29/2020     Review of Systems  Constitutional: Positive for fatigue. Negative for chills, fever and unexpected weight change.  HENT: Negative for trouble swallowing.   Respiratory: Positive for cough, chest tightness, shortness of breath and wheezing.   Cardiovascular: Negative for chest pain, palpitations and leg swelling.  Gastrointestinal: Negative for abdominal pain, constipation and diarrhea.  Genitourinary: Positive for hematuria.  Musculoskeletal: Positive for arthralgias and gait problem (no longer using walker).  Skin: Positive for color change and wound.  Neurological: Negative for dizziness and headaches.  Psychiatric/Behavioral: Negative for dysphoric mood and sleep disturbance. The patient is not nervous/anxious.     Patient Active Problem  List   Diagnosis Date Noted  . Labile blood glucose   . Steroid-induced hyperglycemia   . Thrombocytopenia ([place])   . Acute blood loss anemia   . Slow transit constipation   . AKI (acute kidney injury) ([place])   . Supplemental oxygen dependent   . Klebsiella pneumoniae sepsis ([place])   . Critical lower limb ischemia ([place])   . Acute renal failure ([place])   . Cerebrovascular accident (CVA) due to embolism of cerebral artery ([place])   . Sepsis ([place]) 07/10/2020  . Pulmonary hypertension ([place]) 12/18/2018  . Depression, major, recurrent, in partial remission ([place]) 10/22/2018  . Compression fx, thoracic spine ([place]) 12/04/2017  . Hyperlipidemia, mixed 04/21/2017  . Cough variant asthma  vs UACS  02/20/2017  . Hypothyroidism, acquired 03/27/2015  . Essential hypertension 03/27/2015  . Acquired absence of  both cervix and uterus 03/27/2015  . H/O renal calculi 03/27/2015  . Allergic rhinitis 01/07/2015  . Obstructive sleep apnea 09/29/2014  . Other malaise and fatigue 06/03/2014  . Asthma, chronic 02/24/2014  . Cough 04/30/2013  . Pulmonary sarcoidosis ([place]) 03/12/2013  . CKD (chronic kidney disease) stage 3, GFR 30-59 ml/min (HCC) 03/12/2013  . Calcium blood increased 05/23/2012    Allergies  Allergen Reactions  . Nitrofurantoin Nausea Only  . Tramadol     Nausea and vomiting  . Sulfa Antibiotics Rash    As an infant    Past Surgical History:  Procedure Laterality Date  . CARDIAC CATHETERIZATION  10/18/2018   [facility]  . CATARACT EXTRACTION W/PHACO Left 07/31/2019   Procedure: CATARACT EXTRACTION PHACO AND INTRAOCULAR LENS PLACEMENT (IOC) LEFT 00:51.1  17.9%  9.15;  Surgeon: Leandrew Koyanagi, MD;  Location: [facility];  Service: Ophthalmology;  Laterality: Left;  keep this patient second  . COLON SURGERY     "colon was fused to bladder - operated on both"  . COLONOSCOPY  09/18/2007   diverticuli, no polyps  . COLONOSCOPY  05/26/2010   diverticuli, no polyps  . CYSTOSCOPY WITH STENT PLACEMENT Bilateral 07/14/2020   Procedure: CYSTOSCOPY WITH STENT PLACEMENT, RETROPYLOGRAM;  Surgeon: Billey Co, MD;  Location: [facility];  Service: Urology;  Laterality: Bilateral;  . CYSTOSCOPY/URETEROSCOPY/HOLMIUM LASER/STENT PLACEMENT Left 02/20/2020   Procedure: CYSTOSCOPY/URETEROSCOPY/LITHOTRIPSY /STENT PLACEMENT;  Surgeon: Hollice Espy, MD;  Location: [facility];  Service: Urology;  Laterality: Left;  . PARS PLANA VITRECTOMY Right 05/20/2015   Procedure: PARS PLANA VITRECTOMY WITH 25 GAUGE, laser;  Surgeon: Milus Height, MD;  Location: [facility];  Service: Ophthalmology;  Laterality: Right;  . PARTIAL HYSTERECTOMY  1990  . TEE WITHOUT CARDIOVERSION N/A 07/27/2020   Procedure: TRANSESOPHAGEAL ECHOCARDIOGRAM (TEE);  Surgeon: Teodoro Spray, MD;  Location: [facility];  Service:  Cardiovascular;  Laterality: N/A;  . TUBAL LIGATION      Social History   Tobacco Use  . Smoking status: Never Smoker  . Smokeless tobacco: Never Used  Vaping Use  . Vaping Use: Never used  Substance Use Topics  . Alcohol use: No  . Drug use: No     Medication list has been reviewed and updated.  Current Meds  Medication Sig  . acetaminophen (TYLENOL) 325 MG tablet Take 2 tablets (650 mg total) by mouth every 4 (four) hours as needed for mild pain or fever.  Marland Kitchen aspirin EC 81 MG EC tablet Take 1 tablet (81 mg total) by mouth daily. Swallow whole.  Marland Kitchen atorvastatin (LIPITOR) 40 MG tablet Take 1 tablet (40 mg total) by mouth daily.  . budesonide-formoterol (  SYMBICORT) 80-4.5 MCG/ACT inhaler Take 2 puffs first thing in am and then another 2 puffs about 12 hours later.  . denosumab (PROLIA) 60 MG/ML SOSY injection Inject 60 mg into the skin every 6 (six) months.  . dorzolamide (TRUSOPT) 2 % ophthalmic solution Place 1 drop into both eyes 2 (two) times daily.  Marland Kitchen escitalopram (LEXAPRO) 20 MG tablet Take 1 tablet (20 mg total) by mouth daily.  . fluconazole (DIFLUCAN) 100 MG tablet Take 2 tablets by mouth the first day, followed by one tablet daily until complete  . folic acid (FOLVITE) 1 MG tablet Take 1 tablet (1 mg total) by mouth daily.  Marland Kitchen latanoprost (XALATAN) 0.005 % ophthalmic solution Place 1 drop into the right eye at bedtime.  Marland Kitchen levothyroxine (SYNTHROID) 137 MCG tablet TAKE 1 TABLET (137 MCG TOTAL) BY MOUTH DAILY BEFORE BREAKFAST.  . methotrexate (RHEUMATREX) 5 MG tablet Take 5 tablets (25 mg total) by mouth once a week. Caution:Chemotherapy. Protect from light.  . Multiple Vitamins-Minerals (MULTIVITAMIN WITH MINERALS) tablet Take 1 tablet by mouth daily.  . NON FORMULARY CPAP nightly  . OXYGEN Inhale 2 L into the lungs as needed.  . predniSONE (DELTASONE) 5 MG tablet Take 3 tablets (15 mg total) by mouth daily with breakfast.  . Treprostinil (TYVASO) 0.6 MG/ML SOLN Inhale 18 mcg  into the lungs 4 (four) times daily. 12 breaths 4 times a day    PHQ 2/9 Scores 09/21/2020 01/07/2020 10/21/2019 05/27/2019  PHQ - 2 Score 0 0 0 0  PHQ- 9 Score 2 0 0 0    GAD 7 : Generalized Anxiety Score 09/21/2020 04/26/2019  Nervous, Anxious, on Edge 0 1  Control/stop worrying 0 0  Worry too much - different things 0 1  Trouble relaxing 0 0  Restless 0 0  Easily annoyed or irritable 0 1  Afraid - awful might happen 0 0  Total GAD 7 Score 0 3  Anxiety Difficulty Not difficult at all Not difficult at all    BP Readings from Last 3 Encounters:  09/21/20 130/60  09/16/20 (!) 141/76  09/14/20 139/71    Physical Exam Constitutional:      Appearance: Normal appearance.     Comments: Nasal canula in place  Pulmonary:     Effort: Pulmonary effort is normal. No respiratory distress or retractions.     Comments: Occasional cough during the visit Neurological:     Mental Status: She is alert.  Psychiatric:        Attention and Perception: Attention normal.        Mood and Affect: Mood normal.        Speech: Speech normal.        Cognition and Memory: Cognition normal.     Wt Readings from Last 3 Encounters:  09/21/20 172 lb (78 kg)  09/16/20 172 lb (78 kg)  09/14/20 174 lb (78.9 kg)    BP 130/60 Comment: pt reported  Ht _0  (1.727 m)   Wt 172 lb (78 kg)   BMI 26.15 kg/m   Assessment and Plan: 1. Cerebrovascular accident (CVA) due to embolism of cerebral artery (HCC) Continue aspirin daily Continue atorvastatin daily Continue daily rehab exercises to improve strength and function  2. Critical lower limb ischemia (HCC) Per VS and patient, her bilateral foot lesions are healing She denies significant pain - family is helping with dressing changes  3. Depression, major, recurrent, in partial remission ([place]) Clinically stable on current regimen with good control of symptoms,  No SI or HI. She is grateful to have survived the hospital stay. Will continue current  therapy.  4. Anemia, unspecified type New anemia noted on last labs.  Not surprising in view of her complicated hospital course as well as minor chronic bleeding from her urinary tract and her nose Recommend vaseline to nares as needed Will check labs and advise - CBC with Differential/Platelet  5. Hypothyroidism, acquired supplemented  6. Pulmonary hypertension (HCC) On chronic O2 and being followed by Pulmonary Also on MTX, prednisone and Tyvaso inhalation  7. Pulmonary sarcoidosis ([place])  8. Renal insufficiency Improving nicely at discharge - will repeat labs this week to monitor - Comprehensive metabolic panel  I spent 25 minutes on this encounter.   Partially dictated using Editor, commissioning. Any errors are unintentional.  Halina Maidens, MD [facility]  09/21/2020

## 2020-09-21 NOTE — Telephone Encounter (Signed)
-----  Message from Billey Co, MD sent at 09/21/2020  8:12 AM EST ----- Lets do fluconazole 277m x 1, then 4 days of fluconazole 1045mdaily, thanks  BrNickolas MadridMD 09/21/2020

## 2020-09-21 NOTE — Telephone Encounter (Signed)
This visit type is being conducted due to national recommendations for restrictions regarding the COVID- 19 Pandemic (e.g. social distancing) in effort to limit this patients exposure and mitigate transmission in our community. This visit type is felt to be most appropriate for this patient at this time.   I connected with the patient today and received telephone consent from the patient and patient understand this consent will be good for 1 year. 

## 2020-09-21 NOTE — Telephone Encounter (Signed)
Called pt informed her of the information below. Pt gave verbal understanding. RX sent.

## 2020-09-23 DIAGNOSIS — N289 Disorder of kidney and ureter, unspecified: Secondary | ICD-10-CM | POA: Diagnosis not present

## 2020-09-23 DIAGNOSIS — D649 Anemia, unspecified: Secondary | ICD-10-CM | POA: Diagnosis not present

## 2020-09-23 DIAGNOSIS — G4733 Obstructive sleep apnea (adult) (pediatric): Secondary | ICD-10-CM | POA: Diagnosis not present

## 2020-09-24 ENCOUNTER — Telehealth (INDEPENDENT_AMBULATORY_CARE_PROVIDER_SITE_OTHER): Payer: Self-pay

## 2020-09-24 ENCOUNTER — Telehealth: Payer: Self-pay

## 2020-09-24 DIAGNOSIS — R339 Retention of urine, unspecified: Secondary | ICD-10-CM | POA: Diagnosis not present

## 2020-09-24 DIAGNOSIS — E039 Hypothyroidism, unspecified: Secondary | ICD-10-CM | POA: Diagnosis not present

## 2020-09-24 DIAGNOSIS — N189 Chronic kidney disease, unspecified: Secondary | ICD-10-CM | POA: Diagnosis not present

## 2020-09-24 DIAGNOSIS — D62 Acute posthemorrhagic anemia: Secondary | ICD-10-CM | POA: Diagnosis not present

## 2020-09-24 DIAGNOSIS — D696 Thrombocytopenia, unspecified: Secondary | ICD-10-CM | POA: Diagnosis not present

## 2020-09-24 DIAGNOSIS — D860 Sarcoidosis of lung: Secondary | ICD-10-CM | POA: Diagnosis not present

## 2020-09-24 DIAGNOSIS — I272 Pulmonary hypertension, unspecified: Secondary | ICD-10-CM | POA: Diagnosis not present

## 2020-09-24 DIAGNOSIS — I1310 Hypertensive heart and chronic kidney disease without heart failure, with stage 1 through stage 4 chronic kidney disease, or unspecified chronic kidney disease: Secondary | ICD-10-CM | POA: Diagnosis not present

## 2020-09-24 DIAGNOSIS — J45909 Unspecified asthma, uncomplicated: Secondary | ICD-10-CM | POA: Diagnosis not present

## 2020-09-24 DIAGNOSIS — I70223 Atherosclerosis of native arteries of extremities with rest pain, bilateral legs: Secondary | ICD-10-CM | POA: Diagnosis not present

## 2020-09-24 DIAGNOSIS — N179 Acute kidney failure, unspecified: Secondary | ICD-10-CM | POA: Diagnosis not present

## 2020-09-24 DIAGNOSIS — T380X5D Adverse effect of glucocorticoids and synthetic analogues, subsequent encounter: Secondary | ICD-10-CM | POA: Diagnosis not present

## 2020-09-24 DIAGNOSIS — H9190 Unspecified hearing loss, unspecified ear: Secondary | ICD-10-CM | POA: Diagnosis not present

## 2020-09-24 DIAGNOSIS — R739 Hyperglycemia, unspecified: Secondary | ICD-10-CM | POA: Diagnosis not present

## 2020-09-24 DIAGNOSIS — I088 Other rheumatic multiple valve diseases: Secondary | ICD-10-CM | POA: Diagnosis not present

## 2020-09-24 DIAGNOSIS — N390 Urinary tract infection, site not specified: Secondary | ICD-10-CM

## 2020-09-24 DIAGNOSIS — Z4800 Encounter for change or removal of nonsurgical wound dressing: Secondary | ICD-10-CM | POA: Diagnosis not present

## 2020-09-24 LAB — CBC WITH DIFFERENTIAL/PLATELET
Basophils Absolute: 0.1 10*3/uL (ref 0.0–0.2)
Basos: 0 %
EOS (ABSOLUTE): 0.3 10*3/uL (ref 0.0–0.4)
Eos: 2 %
Hematocrit: 35.9 % (ref 34.0–46.6)
Hemoglobin: 12.3 g/dL (ref 11.1–15.9)
Immature Grans (Abs): 0.2 10*3/uL — ABNORMAL HIGH (ref 0.0–0.1)
Immature Granulocytes: 1 %
Lymphocytes Absolute: 1.5 10*3/uL (ref 0.7–3.1)
Lymphs: 9 %
MCH: 33.2 pg — ABNORMAL HIGH (ref 26.6–33.0)
MCHC: 34.3 g/dL (ref 31.5–35.7)
MCV: 97 fL (ref 79–97)
Monocytes Absolute: 1.3 10*3/uL — ABNORMAL HIGH (ref 0.1–0.9)
Monocytes: 8 %
Neutrophils Absolute: 13.4 10*3/uL — ABNORMAL HIGH (ref 1.4–7.0)
Neutrophils: 80 %
Platelets: 366 10*3/uL (ref 150–450)
RBC: 3.7 x10E6/uL — ABNORMAL LOW (ref 3.77–5.28)
RDW: 15.1 % (ref 11.7–15.4)
WBC: 16.8 10*3/uL — ABNORMAL HIGH (ref 3.4–10.8)

## 2020-09-24 LAB — COMPREHENSIVE METABOLIC PANEL
ALT: 17 IU/L (ref 0–32)
AST: 18 IU/L (ref 0–40)
Albumin/Globulin Ratio: 1.6 (ref 1.2–2.2)
Albumin: 3.8 g/dL (ref 3.8–4.8)
Alkaline Phosphatase: 67 IU/L (ref 44–121)
BUN/Creatinine Ratio: 20 (ref 12–28)
BUN: 21 mg/dL (ref 8–27)
Bilirubin Total: 0.4 mg/dL (ref 0.0–1.2)
CO2: 28 mmol/L (ref 20–29)
Calcium: 9.7 mg/dL (ref 8.7–10.3)
Chloride: 99 mmol/L (ref 96–106)
Creatinine, Ser: 1.04 mg/dL — ABNORMAL HIGH (ref 0.57–1.00)
GFR calc Af Amer: 65 mL/min/{1.73_m2} (ref >59)
GFR calc non Af Amer: 57 mL/min/{1.73_m2} — ABNORMAL LOW (ref >59)
Globulin, Total: 2.4 g/dL (ref 1.5–4.5)
Glucose: 133 mg/dL — ABNORMAL HIGH (ref 65–99)
Potassium: 3.3 mmol/L — ABNORMAL LOW (ref 3.5–5.2)
Sodium: 144 mmol/L (ref 134–144)
Total Protein: 6.2 g/dL (ref 6.0–8.5)

## 2020-09-24 NOTE — Telephone Encounter (Signed)
Let's reach out to the patient's husband as he's an MD and he sees the wound regularly and does regular wound changes for her as well.  Does he believe that the necrotic areas have increased and has the wound began to have a foul odor that is concerning for infection? If he shares these concerns, we can get try to get her in to see Dr. Lucky Cowboy tomorrow to get his input.  Otherwise, we can maintain her current follow up

## 2020-09-24 NOTE — Telephone Encounter (Signed)
-----  Message from Billey Co, MD sent at 09/24/2020  7:34 AM EST ----- WBC elevated at 17k despite fluconazole for the yeast in urine- could also be related to her toe gangrene issues though. Lets see if we can have her give a UA and culture today or Monday/Tuesday to confirm negative prior to surgery Friday. Thanks  Nickolas Madrid, MD 09/24/2020

## 2020-09-24 NOTE — Telephone Encounter (Signed)
The Nurse Cecille Rubin from [facility] called and left a VM  Saying the pt has a odor coming from the wounds on her Lt  Toe and foot and Rt toe and an open raw area Per the pt's last office notes the pt's Lt foot has more extensive necrotic area's including the middle two toes with necrotic changes on the third toe.There was also a necrotic portion on  Near the dorsal surface as well as the plantar surface. I called pt to confim area's and she made me aware that she has spoke with her other provider's and they seem to think it is infected. Please advise.

## 2020-09-24 NOTE — Telephone Encounter (Signed)
Called pt informed her of the information below. Pt voiced understanding. Orders placed for UA & CX. Pt requests that daughter be able to come by the office and collect supplies for clean catch UA as she is very immobile at the time. Advised pt on collecting clean catch for UA and CX. Supplies placed with reception for pt's daughter to collect.

## 2020-09-25 ENCOUNTER — Other Ambulatory Visit: Payer: Self-pay

## 2020-09-25 ENCOUNTER — Encounter
Admission: RE | Admit: 2020-09-25 | Discharge: 2020-09-25 | Disposition: A | Discharge status: Home / Self Care | Payer: BC Managed Care – PPO | Source: Ambulatory Visit | Attending: Urology | Admitting: Urology

## 2020-09-25 ENCOUNTER — Other Ambulatory Visit: Payer: BC Managed Care – PPO

## 2020-09-25 ENCOUNTER — Other Ambulatory Visit: Payer: Self-pay | Admitting: Urology

## 2020-09-25 ENCOUNTER — Telehealth: Payer: Self-pay

## 2020-09-25 DIAGNOSIS — N390 Urinary tract infection, site not specified: Secondary | ICD-10-CM | POA: Diagnosis not present

## 2020-09-25 DIAGNOSIS — N200 Calculus of kidney: Secondary | ICD-10-CM

## 2020-09-25 HISTORY — DX: Thyrotoxicosis, unspecified without thyrotoxic crisis or storm: E05.90

## 2020-09-25 HISTORY — DX: Chronic kidney disease, unspecified: N18.9

## 2020-09-25 HISTORY — DX: Depression, unspecified: F32.A

## 2020-09-25 HISTORY — DX: Pneumonia, unspecified organism: J18.9

## 2020-09-25 LAB — MICROSCOPIC EXAMINATION
Epithelial Cells (non renal): NONE SEEN /hpf (ref 0–10)
RBC, Urine: >30 /hpf — ABNORMAL HIGH (ref 0–2)
WBC, UA: >30 /hpf — ABNORMAL HIGH (ref 0–5)

## 2020-09-25 LAB — URINALYSIS, COMPLETE
Bilirubin, UA: NEGATIVE
Glucose, UA: NEGATIVE
Ketones, UA: NEGATIVE
Nitrite, UA: NEGATIVE
Specific Gravity, UA: 1.025 (ref 1.005–1.030)
Urobilinogen, Ur: 0.2 mg/dL (ref 0.2–1.0)
pH, UA: 6.5 (ref 5.0–7.5)

## 2020-09-25 MED ORDER — CIPROFLOXACIN HCL 500 MG PO TABS: 500.0000 mg | ORAL_TABLET | Freq: Two times a day (BID) | ORAL | 0 refills | Status: DC

## 2020-09-25 NOTE — Telephone Encounter (Signed)
Called pt informed her of the information below. Pt gave verbal understanding. RX sent in. Called pharmacy to confirm in stock.

## 2020-09-25 NOTE — Patient Instructions (Addendum)
Your procedure is scheduled on: Report to the Registration Desk on the 1st floor of the [facility]. To find out your arrival time, please call [phone] between 1PM - 3PM on:  REMEMBER: Instructions that are not followed completely may result in serious medical risk, up to and including death; or upon the discretion of your surgeon and anesthesiologist your surgery may need to be rescheduled.  Do not eat food after midnight the night before surgery.  No gum chewing, lozengers or hard candies.   TAKE THESE MEDICATIONS THE MORNING OF SURGERY WITH A SIP OF WATER:  - acetaminophen (TYLENOL) 325 MG tablet as directed if needed - levothyroxine (SYNTHROID) 137 MCG tablet - Treprostinil (TYVASO) 0.6 MG/ML SOLN - budesonide-formoterol (SYMBICORT) 80-4.5 MCG/ACT inhaler  Follow recommendations from Cardiologist, Pulmonologist or PCP regarding stopping Aspirin, Coumadin, Plavix, Eliquis, Pradaxa, or Pletal.  One week prior to surgery: Stop Anti-inflammatories (NSAIDS) such as Advil, Aleve, Ibuprofen, Motrin, Naproxen, Naprosyn and Aspirin based products such as Excedrin, Goodys Powder, BC Powder.  Stop ANY OVER THE COUNTER supplements until after surgery. (However, you may continue taking Vitamin D, Vitamin B, and multivitamin up until the day before surgery.)  No Alcohol for 24 hours before or after surgery.  No Smoking including e-cigarettes for 24 hours prior to surgery.  No chewable tobacco products for at least 6 hours prior to surgery.  No nicotine patches on the day of surgery.  Do not use any "recreational" drugs for at least a week prior to your surgery.  Please be advised that the combination of cocaine and anesthesia may have negative outcomes, up to and including death. If you test positive for cocaine, your surgery will be cancelled.  On the morning of surgery brush your teeth with toothpaste and water, you may rinse your mouth with mouthwash if you wish. Do not swallow  any toothpaste or mouthwash.  Do not wear jewelry, make-up, hairpins, clips or nail polish.  Do not wear lotions, powders, or perfumes.   Do not shave body from the neck down 48 hours prior to surgery just in case you cut yourself which could leave a site for infection.  Also, freshly shaved skin may become irritated if using the CHG soap.  Contact lenses, hearing aids and dentures may not be worn into surgery.  Do not bring valuables to the hospital. [facility] is not responsible for any missing/lost belongings or valuables.   Bring your C-PAP to the hospital with you in case you may have to spend the night.   Notify your doctor if there is any change in your medical condition (cold, fever, infection).  Wear comfortable clothing (specific to your surgery type) to the hospital.  Plan for stool softeners for home use; pain medications have a tendency to cause constipation. You can also help prevent constipation by eating foods high in fiber such as fruits and vegetables and drinking plenty of fluids as your diet allows.  After surgery, you can help prevent lung complications by doing breathing exercises.  Take deep breaths and cough every 1-2 hours. Your doctor may order a device called an Incentive Spirometer to help you take deep breaths. When coughing or sneezing, hold a pillow firmly against your incision with both hands. This is called "splinting." Doing this helps protect your incision. It also decreases belly discomfort.  If you are being admitted to the hospital overnight, leave your suitcase in the car. After surgery it may be brought to your room.  If you  are being discharged the day of surgery, you will not be allowed to drive home. You will need a responsible adult (18 years or older) to drive you home and stay with you that night.   If you are taking public transportation, you will need to have a responsible adult (18 years or older) with you. Please confirm with your  physician that it is acceptable to use public transportation.   Please call the [facility] at [phone] if you have any questions about these instructions.  Visitation Policy:  Patients undergoing a surgery or procedure may have one family member or support person with them as long as that person is not COVID-19 positive or experiencing its symptoms.  That person may remain in the waiting area during the procedure.  Inpatient Visitation Update:   In an effort to ensure the safety of our team members and our patients, we are implementing a change to our visitation policy:  Effective Monday, Aug. 9, at 7 a.m., inpatients will be allowed one support person.  o The support person may change daily.  o The support person must pass our screening, gel in and out, and wear a mask at all times, including in the patient's room.  o Patients must also wear a mask when staff or their support person are in the room.  o Masking is required regardless of vaccination status.  Systemwide, no visitors 17 or younger.

## 2020-09-25 NOTE — Telephone Encounter (Signed)
-----  Message from Billey Co, MD sent at 09/25/2020  1:06 PM EST ----- Regarding: Abx for UTI Urinalysis appears grossly infected today, and will send for culture.  Please start Cipro 500 mg twice daily x10 days, she has surgery next week for stone removal. (Has bilateral stents in place)  Nickolas Madrid, MD 09/25/2020

## 2020-09-25 NOTE — Telephone Encounter (Signed)
I called and left a VM for the pts husband.

## 2020-09-28 ENCOUNTER — Encounter: Payer: Self-pay | Admitting: Urology

## 2020-09-28 NOTE — Progress Notes (Signed)
[facility] Perioperative Services  Pre-Admission/Anesthesia Testing Clinical Review  Date: 10/01/20  Patient Demographics:  Name: Stacie [name] DOB:   1955/10/20 MRN:   [id number]  Planned Surgical Procedure(s):    Case: [id number] Date/Time: 10/02/20 9833   Procedure: CYSTOSCOPY/URETEROSCOPY/HOLMIUM LASER/STENT PLACEMENT (Bilateral )   Anesthesia type: Choice   Pre-op diagnosis: bilateral ureteral stones   Location: [facility] OR ROOM 10 / [place] ORS FOR ANESTHESIA GROUP   Surgeons: Billey Co, MD    NOTE: Available PAT nursing documentation and vital signs have been reviewed. Clinical nursing staff has updated patient's PMH/PSHx, current medication list, and drug allergies/intolerances to ensure comprehensive history available to assist in medical decision making as it pertains to the aforementioned surgical procedure and anticipated anesthetic course.   Clinical Discussion:  Stacie [name] is a 64 y.o. female who is submitted for pre-surgical anesthesia review and clearance prior to her undergoing the above procedure. Patient has never been a smoker. Pertinent PMH includes: CVA (07/2020), pulmonary hypertension, cardiac murmur, sarcoidosis, asthma, OSAH (requires nocturnal PAP therapy), GERD (no daily treatment), CKD-III, hypothyroidism, urolithiasis, urosepsis.   Patient presented to the [facility] ED on 07/09/2020 via EMS from home with complaints of AMS; notes reviewed.  Patient reportedly had diarrhea, generalized weakness, and shortness of breath 24 hours after receiving her influenza vaccine.  Patient febrile at 100.6, tachypneic (RR 28), and tachycardic at 115 bpm; blood pressure 138/96.  Initial lab studies included a WBC count of 32.9 K/uL.  BUN 37 and creatinine 4.16 mg/dL.  Lactic acid markedly elevated at 5.8 mmol/L.  UA consistent with infection; subsequent culture grew out > 100,000 CFU/mL Klebsiella pneumoniae. Blood cultures x 2 sets also positive for the  same organism. High-sensitivity troponin elevated at 88 ng/L. Respiratory panel negative for influenza and SARS-CoV-2. Given patient's altered mental status and increased work of breathing, patient was placed on NIPPV upon arrival.  CXR revealed mild bibasilar patchy airspace opacities concerning for acute on chronic infiltrate. Follow-up CT imaging of the chest revealed chronic changes associated with known pulmonary sarcoidosis with biapical predominant pulmonary fibrosis and a very mild superimposed active inflammatory infiltrate.  CT imaging of the head was negative.  Treatment initiated with intravenous ceftazidime and azithromycin. Fluid resuscitation began at 30 cc/kg. Patient subsequently admitted to the ICU for ongoing treatment of sepsis and acute on chronic hypoxic respiratory failure in the setting of CAP and pulmonary sarcoidosis. Patient extremely altered and combative upon arrival to the ICU; started on dexmedetomidine infusion.  Remainder of hospital course as follows:   CT imaging of the abdomen pelvis on 07/14/2020 revealed BILATERAL urolithiasis with moderate associated hydronephrosis; 8 mm stone on the RIGHT and 5 mm on the LEFT.  Patient was taken to the OR for cystoscopy and stent placement.  Notes indicate that there was purulent drainage noted from the RIGHT kidney seen on cystoscopic exam.  Foley catheter was placed.    Neurological exam felt to be decompensated on 07/16/2020.  Patient with increased weakness and felt to be more dysarthric.  Code stroke was called.  Patient was seen and consult by Dr. [name].  Initial NIHSS was 16.  CT imaging of the head revealed no convincing penumbra.  Follow-up MRI imaging of the brain was severely limited due to motion artifact.  Unable to definitively rule out areas of small infarct.  TTE performed on 07/16/2020 revealed a mild to moderately decreased left ventricular systolic function; LVEF 35 to 40%.   Repeat MRI imaging  of the  brain on 07/20/2020 revealed few punctate foci of restricted diffusion/T2 hyperintensity within the white matter of bilateral frontal lobes suggesting acute/subacute infarcts, likely embolic.  Vascular and podiatry were consulted for bilateral shower emboli causing gangrenous type changes patient's BILATERAL forefeet.  Discussed potential for amputations if patient fails to improve. Peripheral pulses intact; angiogram deferred.    TEE on 07/27/2020 revealed normal left ventricular systolic function with an LVEF of 60-65%.   Patient's clinical condition improved throughout her admission. She was ultimately discharged to [facility] on 07/28/2020. Recovery at [facility] including multi-disciplinary approach. Patient was followed by medicine, PT, OT, and ST. Meeting functional goals and adequate progress towards her PLOF, patient was discharged home on 08/14/2020 with continued support provided by home health.    Patient seen in follow up consult on 08/24/2020 by vascular. Discussed critical lower limb ischemia. Exam gave rise to concern for possible impending auto amputation of patient's RIGHT great toe. Gangrenous changes in her LEFT foot may require transmetatarsal amputation. Patient to monitor for progression and RTC in 1 month with plans for ABI studies.   Follow up visit was vascular on 09/14/2020 demonstrated improvement with no evidence of wet gangrene/infection. Tissue necrosis remained, however patient not experiencing pain. ABI studies within normal range, however TBIs reveal abnormal flow (L>R); see below for full interpretation of studies. Continued close monitoring recommended with plans for RTC in 6 weeks.   Patient scheduled to undergo cystoscopy with stent exchange on 10/02/2020 with Dr. Nickolas Madrid. Given patient's past medical history significant for recent CVA, presurgical clearance from patient's PCP Army Melia, MD) was sought by the PAT team. Per internal medicine, "patient is optimized for  planned surgical course. She may proceed with surgery at an overall LOW risk".  This patient is on daily antiplatelet therapy.  Given her past medical history significant for recent CVA, I have reached out to performing surgeon and been advised that patient may remain on her daily low-dose ASA throughout the perioperative course. PCP in agreement and requests that patient continue antiplatelet medication as previously prescribed.   She denies previous perioperative complications with anesthesia. She underwent a general anesthetic course here (ASA III) in 07/2020 with no documented complications.   Vitals with BMI 09/21/2020 09/16/2020 09/14/2020  Height _0  _1  _2   Weight 172 lbs 172 lbs 174 lbs  BMI 26.16 95.63 87.56  Systolic 433 295 188  Diastolic 60 76 71  Pulse - 114 109    Providers/Specialists:   NOTE: Primary physician provider listed below. Patient may have been seen by APP or partner within same practice.   PROVIDER ROLE LAST Ranae Pila, MD Urology (Surgeon)  09/16/2020  Glean Hess, MD Primary Care Provider  09/21/2020  Leotis Pain, MD Vascular Surgery  09/14/2020  Whitson, [place], MD Pulmonary Medicine  07/08/2020   Allergies:  Nitrofurantoin, Tramadol, and Sulfa antibiotics  Current Home Medications:   No current facility-administered medications for this encounter.   Marland Kitchen acetaminophen (TYLENOL) 325 MG tablet  . aspirin EC 81 MG EC tablet  . budesonide-formoterol (SYMBICORT) 80-4.5 MCG/ACT inhaler  . denosumab (PROLIA) 60 MG/ML SOSY injection  . dorzolamide (TRUSOPT) 2 % ophthalmic solution  . escitalopram (LEXAPRO) 20 MG tablet  . fluconazole (DIFLUCAN) 100 MG tablet  . folic acid (FOLVITE) 1 MG tablet  . latanoprost (XALATAN) 0.005 % ophthalmic solution  . levothyroxine (SYNTHROID) 137 MCG tablet  . methotrexate (RHEUMATREX) 5 MG tablet  . Multiple Vitamins-Minerals (MULTIVITAMIN WITH MINERALS)  tablet  . NON FORMULARY  . OXYGEN  .  predniSONE (DELTASONE) 5 MG tablet  . Treprostinil (TYVASO) 0.6 MG/ML SOLN  . atorvastatin (LIPITOR) 40 MG tablet  . ciprofloxacin (CIPRO) 500 MG tablet   History:   Past Medical History:  Diagnosis Date  . Acute respiratory failure with hypoxia ([place])   . Arrhythmia    patient unaware if this is current  . Asthma   . Chronic kidney disease   . Depression   . GERD (gastroesophageal reflux disease)   . Heart murmur   . History of kidney stones   . HOH (hard of hearing)    wear aids  . Hyperthyroidism   . Hypothyroidism   . IBS (irritable bowel syndrome)   . Pneumonia   . Pulmonary hypertension ([place])   . Sarcoid   . Sarcoidosis   . Seasonal allergies   . Sleep apnea CPAP with O2  . Stroke ([place]) 07/2020   [place]  . Wears hearing aid in both ears    Past Surgical History:  Procedure Laterality Date  . ABDOMINAL HYSTERECTOMY     partial  . CARDIAC CATHETERIZATION  10/18/2018   [facility]  . CATARACT EXTRACTION W/PHACO Left 07/31/2019   Procedure: CATARACT EXTRACTION PHACO AND INTRAOCULAR LENS PLACEMENT (IOC) LEFT 00:51.1  17.9%  9.15;  Surgeon: Leandrew Koyanagi, MD;  Location: [place];  Service: Ophthalmology;  Laterality: Left;  keep this patient second  . COLON SURGERY     "colon was fused to bladder - operated on both"  . COLONOSCOPY  09/18/2007   diverticuli, no polyps  . COLONOSCOPY  05/26/2010   diverticuli, no polyps  . CYSTOSCOPY WITH STENT PLACEMENT Bilateral 07/14/2020   Procedure: CYSTOSCOPY WITH STENT PLACEMENT, RETROPYLOGRAM;  Surgeon: Billey Co, MD;  Location: [facility] ORS;  Service: Urology;  Laterality: Bilateral;  . CYSTOSCOPY/URETEROSCOPY/HOLMIUM LASER/STENT PLACEMENT Left 02/20/2020   Procedure: CYSTOSCOPY/URETEROSCOPY/LITHOTRIPSY /STENT PLACEMENT;  Surgeon: Hollice Espy, MD;  Location: [facility] ORS;  Service: Urology;  Laterality: Left;  . EYE SURGERY    . PARS PLANA VITRECTOMY Right 05/20/2015   Procedure: PARS PLANA VITRECTOMY WITH 25  GAUGE, laser;  Surgeon: Milus Height, MD;  Location: [facility] ORS;  Service: Ophthalmology;  Laterality: Right;  . PARTIAL HYSTERECTOMY  1990  . TEE WITHOUT CARDIOVERSION N/A 07/27/2020   Procedure: TRANSESOPHAGEAL ECHOCARDIOGRAM (TEE);  Surgeon: Teodoro Spray, MD;  Location: [facility] ORS;  Service: Cardiovascular;  Laterality: N/A;  . TUBAL LIGATION    . WISDOM TOOTH EXTRACTION     Family History  Problem Relation Age of Onset  . Allergies Father   . Asthma Father   . Colon cancer Father   . Allergies Brother   . Asthma Brother   . Breast cancer Maternal Grandmother    Social History   Tobacco Use  . Smoking status: Never Smoker  . Smokeless tobacco: Never Used  Vaping Use  . Vaping Use: Never used  Substance Use Topics  . Alcohol use: No  . Drug use: No    Pertinent Clinical Results:  LABS: Labs reviewed: Acceptable for surgery.  Hospital Outpatient Visit on 09/30/2020  Component Date Value Ref Range Status  . SARS Coronavirus 2 09/30/2020 NEGATIVE  NEGATIVE Final   Comment: (NOTE) SARS-CoV-2 target nucleic acids are NOT DETECTED.  The SARS-CoV-2 RNA is generally detectable in upper and lower respiratory specimens during the acute phase of infection. Negative results do not preclude SARS-CoV-2 infection, do not rule out co-infections with other pathogens, and should  not be used as the sole basis for treatment or other patient management decisions. Negative results must be combined with clinical observations, patient history, and epidemiological information. The expected result is Negative.  Fact Sheet for Patients: SugarRoll.be  Fact Sheet for Healthcare Providers: https://www.woods-mathews.com/  This test is not yet approved or cleared by the [place] FDA and  has been authorized for detection and/or diagnosis of SARS-CoV-2 by FDA under an Emergency Use Authorization (EUA). This EUA will remain  in effect (meaning  this test can be used) for the duration of the COVID-19 declaration under Se                          ction 564(b)(1) of the Act, 21 U.S.C. section 360bbb-3(b)(1), unless the authorization is terminated or revoked sooner.  Performed at [facility], [place] [address], [place], [place] [address]   . WBC 09/30/2020 20.7* 4.0 - 10.5 K/uL Final  . RBC 09/30/2020 3.69* 3.87 - 5.11 MIL/uL Final  . Hemoglobin 09/30/2020 12.1  12.0 - 15.0 g/dL Final  . HCT 09/30/2020 36.2  36.0 - 46.0 % Final  . MCV 09/30/2020 98.1  80.0 - 100.0 fL Final  . MCH 09/30/2020 32.8  26.0 - 34.0 pg Final  . MCHC 09/30/2020 33.4  30.0 - 36.0 g/dL Final  . RDW 09/30/2020 15.0  11.5 - 15.5 % Final  . Platelets 09/30/2020 330  150 - 400 K/uL Final  . nRBC 09/30/2020 0.0  0.0 - 0.2 % Final   Performed at [facility], [address], [place], [place] [address]  . aPTT 09/30/2020 30  24 - 36 seconds Final   Performed at [facility], [address] [place], [place] [address]  . Prothrombin Time 09/30/2020 13.7  11.4 - 15.2 seconds Final  . INR 09/30/2020 1.1  0.8 - 1.2 Final   Comment: (NOTE) INR goal varies based on device and disease states. Performed at [facility], [address], [place], [place] [address]   . Sodium 09/30/2020 139  135 - 145 mmol/L Final  . Potassium 09/30/2020 3.9  3.5 - 5.1 mmol/L Final  . Chloride 09/30/2020 99  98 - 111 mmol/L Final  . CO2 09/30/2020 28  22 - 32 mmol/L Final  . Glucose, Bld 09/30/2020 142* 70 - 99 mg/dL Final   Glucose reference range applies only to samples taken after fasting for at least 8 hours.  . BUN 09/30/2020 29* 8 - 23 mg/dL Final  . Creatinine, Ser 09/30/2020 1.09* 0.44 - 1.00 mg/dL Final  . Calcium 09/30/2020 9.2  8.9 - 10.3 mg/dL Final  . GFR, Estimated 09/30/2020 56* >60 mL/min Final   Comment: (NOTE) Calculated using the CKD-EPI Creatinine Equation (2021)   . Anion gap 09/30/2020 12  5 - 15 Final   Performed at  [facility], [place], [place] [address]    ECG: Date: 07/10/2020 Time ECG obtained: 0002 AM Rate: 111 bpm Rhythm: sinus tachycardia Axis (leads I and aVF): Normal Intervals: QRS 117 ms. QTc 524 ms. ST segment and T wave changes: No evidence of acute ST segment elevation or depression   IMAGING / PROCEDURES: VASCULAR ULTRASOUND ABI WITH/WITHOUT TBI done on 09/14/2020 1. Right:   Normal Doppler waveforms suggest normal perfusion to the right foot.  The toe-brachial index of the 2nd toe is abnormal.  2. Left:   Resting left ankle-brachial index is within normal range.  No evidence of significant left lower extremity arterial disease.   No flow was adequately detected in the great toe.   TRANSESOPHAGEAL ECHOCARDIOGRAM done on 07/27/2020 1. Left ventricular ejection fraction, by estimation, is 60 to 65%. The left ventricle has normal function. The left ventricle has no regional wall motion abnormalities. 2. Right ventricular systolic function was not well visualized. The right ventricular size is not well visualized.  3. La appendage not well visualized but la blood flow was brisk and no smoke noted. . No left atrial/left atrial appendage thrombus was detected.  4. Right atrial size was mildly dilated.  5. The mitral valve is grossly normal. Trivial mitral valve regurgitation.  6. The aortic valve is tricuspid. Aortic valve regurgitation is not visualized.   MRI BRAIN WITHOUT CONTRAST done on 07/21/2020 1. Unchanged DWI from yesterday with small, vague foci of trace diffusion abnormality suggesting scattered small foci of subacute white matter ischemia. 2. Underlying advanced but nonspecific chronic signal changes in the cerebral white matter and the pons redemonstrated, most commonly due to small vessel disease. 3. Negative SWI imaging. 4. No new intracranial abnormality is evident.  BILATERAL CAROTID DOPPLER done on 07/18/2020 1. RIGHT CAROTID  ARTERY:   No significant calcifications of the right common carotid artery.   Intermediate waveform maintained.   Heterogeneous and partially calcified plaque at the right carotid bifurcation.   No significant lumen shadowing.  Low resistance waveform of the right ICA.   No significant tortuosity. 2. RIGHT VERTEBRAL ARTERY:   Antegrade flow with low resistance waveform. 3. LEFT CAROTID ARTERY:  No significant calcifications of the left common carotid artery.   Intermediate waveform maintained.   Heterogeneous and partially calcified plaque at the left carotid bifurcation without significant lumen shadowing.   Low resistance waveform of the left ICA.   No significant tortuosity. 4. LEFT VERTEBRAL ARTERY:    Antegrade flow with low resistance waveform. 5. Color duplex indicates minimal heterogeneous and calcified plaque, with no hemodynamically significant stenosis by duplex criteria in the extracranial cerebrovascular circulation.  ECHOCARDIOGRAM COMPLETE BUBBLE STUDY done on 07/16/2020 1. No evidence of thrombus.  2. Left ventricular ejection fraction, by estimation, is 35 to 40%. The left ventricle has mild to moderately decreased function. The left ventricle demonstrates global hypokinesis. Left ventricular diastolic parameters are consistent with Grade III diastolic dysfunction (restrictive).  3. Right ventricular systolic function is normal. The right ventricular size is normal.  4. The mitral valve is normal in structure. No evidence of mitral valve regurgitation.  5. The aortic valve is grossly normal. Aortic valve regurgitation is not visualized.   EEG done on 07/16/2020 1. Artifact is prominent during the recording often obscuring the background rhythm.  2. When able to be visualized the background is slow and poorly organized.   It consists of low voltage activity in the delta-theta continuum.  For the most part this activity is continuous.   3. There are some rare  short periods of generalized attenuation noted.   4. There are also some rare periodic discharges of triphasic morphology noted.   5. No epileptiform activity is noted.   6. Hyperventilation and intermittent photic stimulation were not performed. 7. This is an abnormal EEG secondary to general background slowing with rare triphasic waves noted.  This finding may be seen with a diffuse cerebral disturbance that is etiologically nonspecific, but may include a metabolic encephalopathy, among other possibilities.     TRANSTHORACIC ECHOCARDIOGRAM done on 07/13/2020 1. Left ventricular ejection fraction, by  estimation, is 30 to 35%. The left ventricle has moderately decreased function. The left ventricle demonstrates global hypokinesis. The left ventricular internal cavity size was mildly dilated. Left ventricular diastolic parameters were normal.  2. Right ventricular systolic function is mildly reduced. The right ventricular size is moderately enlarged. There is moderately elevated pulmonary artery systolic pressure.  3. Left atrial size was moderately dilated.  4. Right atrial size was moderately dilated.  5. The mitral valve is normal in structure. Mild to moderate mitral valve regurgitation.  6. Tricuspid valve regurgitation is moderate.  7. The aortic valve is normal in structure. Aortic valve regurgitation is trivial.    Impression and Plan:  Stacie [name] has been referred for pre-anesthesia review and clearance prior to her undergoing the planned anesthetic and procedural courses. Available labs, pertinent testing, and imaging results were personally reviewed by me. Elevated WBC (20.7) communicated to primary attending surgeon; see notes.  Last culture of patient's urine on 09/25/2020 demonstrated insignificant growth; 10,000 - 25,000 CFU/mL mixed urogenital flora. Leukocytosis felt to be multi-factorial related to corticosteroid use and potentially her gangrenous foot wounds.   Based on  clinical review performed today (10/01/20), barring any significant acute changes in the patient's overall condition, it is anticipated that she will be able to proceed with the planned surgical intervention. This patient has been appropriately cleared by internal medicine with a LOW risk stratification. Any acute changes in clinical condition may necessitate her procedure being postponed and/or cancelled. Pre-surgical instructions were reviewed with the patient during her PAT appointment and questions were fielded by PAT clinical staff.  Honor Loh, MSN, APRN, FNP-C, CEN [facility]  Peri-operative Services Nurse Practitioner Phone: [phone] 10/01/20 11:06 AM  NOTE: This note has been prepared using Dragon dictation software. Despite my best ability to proofread, there is always the potential that unintentional transcriptional errors may still occur from this process.

## 2020-09-28 NOTE — Telephone Encounter (Signed)
I called the pt's husband to make sure he got the VM about his wife I spoke to the pt's husband and he made me aware that  He sees no sign of infection on his wife's feet an that the wounds are healing well and she feels better. the pt's husband said that he believes that his wife's elevated white blood count was coming from her kidneys and that she is due to have her kidney procedure in a few days.Pt's husband advised to contact to office if he feels that the wound is begging to detrorate.

## 2020-09-30 ENCOUNTER — Other Ambulatory Visit
Admission: RE | Admit: 2020-09-30 | Discharge: 2020-09-30 | Disposition: A | Discharge status: Home / Self Care | Payer: BC Managed Care – PPO | Source: Ambulatory Visit | Attending: Urology | Admitting: Urology

## 2020-09-30 ENCOUNTER — Other Ambulatory Visit: Payer: Self-pay | Admitting: Internal Medicine

## 2020-09-30 DIAGNOSIS — I272 Pulmonary hypertension, unspecified: Secondary | ICD-10-CM | POA: Diagnosis not present

## 2020-09-30 DIAGNOSIS — T380X5D Adverse effect of glucocorticoids and synthetic analogues, subsequent encounter: Secondary | ICD-10-CM | POA: Diagnosis not present

## 2020-09-30 DIAGNOSIS — D62 Acute posthemorrhagic anemia: Secondary | ICD-10-CM | POA: Diagnosis not present

## 2020-09-30 DIAGNOSIS — N189 Chronic kidney disease, unspecified: Secondary | ICD-10-CM | POA: Diagnosis not present

## 2020-09-30 DIAGNOSIS — Z01812 Encounter for preprocedural laboratory examination: Secondary | ICD-10-CM | POA: Insufficient documentation

## 2020-09-30 DIAGNOSIS — H9190 Unspecified hearing loss, unspecified ear: Secondary | ICD-10-CM | POA: Diagnosis not present

## 2020-09-30 DIAGNOSIS — D860 Sarcoidosis of lung: Secondary | ICD-10-CM | POA: Diagnosis not present

## 2020-09-30 DIAGNOSIS — E782 Mixed hyperlipidemia: Secondary | ICD-10-CM

## 2020-09-30 DIAGNOSIS — I70223 Atherosclerosis of native arteries of extremities with rest pain, bilateral legs: Secondary | ICD-10-CM | POA: Diagnosis not present

## 2020-09-30 DIAGNOSIS — R339 Retention of urine, unspecified: Secondary | ICD-10-CM | POA: Diagnosis not present

## 2020-09-30 DIAGNOSIS — J45909 Unspecified asthma, uncomplicated: Secondary | ICD-10-CM | POA: Diagnosis not present

## 2020-09-30 DIAGNOSIS — D869 Sarcoidosis, unspecified: Secondary | ICD-10-CM | POA: Diagnosis not present

## 2020-09-30 DIAGNOSIS — N202 Calculus of kidney with calculus of ureter: Secondary | ICD-10-CM | POA: Diagnosis not present

## 2020-09-30 DIAGNOSIS — D696 Thrombocytopenia, unspecified: Secondary | ICD-10-CM | POA: Diagnosis not present

## 2020-09-30 DIAGNOSIS — Z4800 Encounter for change or removal of nonsurgical wound dressing: Secondary | ICD-10-CM | POA: Diagnosis not present

## 2020-09-30 DIAGNOSIS — E039 Hypothyroidism, unspecified: Secondary | ICD-10-CM | POA: Diagnosis not present

## 2020-09-30 DIAGNOSIS — N179 Acute kidney failure, unspecified: Secondary | ICD-10-CM | POA: Diagnosis not present

## 2020-09-30 DIAGNOSIS — R739 Hyperglycemia, unspecified: Secondary | ICD-10-CM | POA: Diagnosis not present

## 2020-09-30 DIAGNOSIS — Z20822 Contact with and (suspected) exposure to covid-19: Secondary | ICD-10-CM | POA: Insufficient documentation

## 2020-09-30 DIAGNOSIS — I088 Other rheumatic multiple valve diseases: Secondary | ICD-10-CM | POA: Diagnosis not present

## 2020-09-30 DIAGNOSIS — I1310 Hypertensive heart and chronic kidney disease without heart failure, with stage 1 through stage 4 chronic kidney disease, or unspecified chronic kidney disease: Secondary | ICD-10-CM | POA: Diagnosis not present

## 2020-09-30 LAB — CULTURE, URINE COMPREHENSIVE

## 2020-09-30 LAB — BASIC METABOLIC PANEL
Anion gap: 12 (ref 5–15)
BUN: 29 mg/dL — ABNORMAL HIGH (ref 8–23)
CO2: 28 mmol/L (ref 22–32)
Calcium: 9.2 mg/dL (ref 8.9–10.3)
Chloride: 99 mmol/L (ref 98–111)
Creatinine, Ser: 1.09 mg/dL — ABNORMAL HIGH (ref 0.44–1.00)
GFR, Estimated: 56 mL/min — ABNORMAL LOW (ref >60)
Glucose, Bld: 142 mg/dL — ABNORMAL HIGH (ref 70–99)
Potassium: 3.9 mmol/L (ref 3.5–5.1)
Sodium: 139 mmol/L (ref 135–145)

## 2020-09-30 LAB — CBC
HCT: 36.2 % (ref 36.0–46.0)
Hemoglobin: 12.1 g/dL (ref 12.0–15.0)
MCH: 32.8 pg (ref 26.0–34.0)
MCHC: 33.4 g/dL (ref 30.0–36.0)
MCV: 98.1 fL (ref 80.0–100.0)
Platelets: 330 10*3/uL (ref 150–400)
RBC: 3.69 MIL/uL — ABNORMAL LOW (ref 3.87–5.11)
RDW: 15 % (ref 11.5–15.5)
WBC: 20.7 10*3/uL — ABNORMAL HIGH (ref 4.0–10.5)
nRBC: 0 % (ref 0.0–0.2)

## 2020-09-30 LAB — APTT: aPTT: 30 seconds (ref 24–36)

## 2020-09-30 LAB — PROTIME-INR
INR: 1.1 (ref 0.8–1.2)
Prothrombin Time: 13.7 seconds (ref 11.4–15.2)

## 2020-09-30 MED ORDER — ATORVASTATIN CALCIUM 40 MG PO TABS: 40.0000 mg | ORAL_TABLET | Freq: Every day | ORAL | 3 refills | Status: DC

## 2020-09-30 NOTE — Progress Notes (Signed)
  [facility] Perioperative Services: Pre-Admission/Anesthesia Testing  Abnormal Lab Notification   Date: 09/30/20  Name: Stacie [name] MRN:   [id number]  Re: Abnormal labs noted during PAT appointment   Provider(s) Notified: Billey Co, MD Notification mode: Routed and/or faxed via [facility] LAB VALUE(S): WBC  Date Value Ref Range Status  09/30/2020 20.7 (H) 4.0 - 10.5 K/uL Final   Notes:  Patient is scheduled for a CYSTOSCOPY/URETEROSCOPY/HOLMIUM LASER/STENT PLACEMENT (Bilateral ) on 10/02/2020. Patient currently on 10 day course of ciprofloxacin that was prescribed on 09/25/2020. Unsure if this is entirely urinary in nature as there seems to be concerns with wounds to her feet as well. Last CBC on 09/23/2020 revealed a WBC of 16.8. This is a Community education officer; no formal response is required.  Honor Loh, MSN, APRN, FNP-C, CEN [facility]  Peri-operative Services Nurse Practitioner Phone: [phone] Fax: [phone] 09/30/20 2:24 PM

## 2020-10-01 LAB — SARS CORONAVIRUS 2 (TAT 6-24 HRS): SARS Coronavirus 2: NEGATIVE

## 2020-10-02 ENCOUNTER — Ambulatory Visit
Admission: RE | Admit: 2020-10-02 | Discharge: 2020-10-02 | Disposition: A | Discharge status: Home / Self Care | Payer: BC Managed Care – PPO | Source: Ambulatory Visit | Attending: Urology | Admitting: Urology

## 2020-10-02 ENCOUNTER — Ambulatory Visit: Payer: BC Managed Care – PPO | Admitting: Urgent Care

## 2020-10-02 ENCOUNTER — Encounter
Admission: RE | Disposition: A | Discharge status: Home / Self Care | Payer: Self-pay | Source: Ambulatory Visit | Attending: Urology

## 2020-10-02 ENCOUNTER — Ambulatory Visit: Payer: BC Managed Care – PPO

## 2020-10-02 ENCOUNTER — Encounter: Payer: Self-pay | Admitting: Urology

## 2020-10-02 DIAGNOSIS — D869 Sarcoidosis, unspecified: Secondary | ICD-10-CM | POA: Insufficient documentation

## 2020-10-02 DIAGNOSIS — E782 Mixed hyperlipidemia: Secondary | ICD-10-CM | POA: Diagnosis not present

## 2020-10-02 DIAGNOSIS — N202 Calculus of kidney with calculus of ureter: Secondary | ICD-10-CM | POA: Insufficient documentation

## 2020-10-02 DIAGNOSIS — N200 Calculus of kidney: Secondary | ICD-10-CM

## 2020-10-02 DIAGNOSIS — G4733 Obstructive sleep apnea (adult) (pediatric): Secondary | ICD-10-CM | POA: Diagnosis not present

## 2020-10-02 DIAGNOSIS — N201 Calculus of ureter: Secondary | ICD-10-CM | POA: Diagnosis not present

## 2020-10-02 DIAGNOSIS — Z20822 Contact with and (suspected) exposure to covid-19: Secondary | ICD-10-CM | POA: Insufficient documentation

## 2020-10-02 DIAGNOSIS — D62 Acute posthemorrhagic anemia: Secondary | ICD-10-CM | POA: Diagnosis not present

## 2020-10-02 DIAGNOSIS — N390 Urinary tract infection, site not specified: Secondary | ICD-10-CM

## 2020-10-02 HISTORY — PX: CYSTOSCOPY/URETEROSCOPY/HOLMIUM LASER/STENT PLACEMENT: SHX6546

## 2020-10-02 SURGERY — CYSTOSCOPY/URETEROSCOPY/HOLMIUM LASER/STENT PLACEMENT
Anesthesia: General | Laterality: Bilateral

## 2020-10-02 MED ORDER — BELLADONNA ALKALOIDS-OPIUM 16.2-60 MG RE SUPP
RECTAL | Status: AC
  Filled 2020-10-02: qty 1

## 2020-10-02 MED ORDER — DEXAMETHASONE SODIUM PHOSPHATE 10 MG/ML IJ SOLN
INTRAMUSCULAR | Status: AC
  Filled 2020-10-02: qty 1

## 2020-10-02 MED ORDER — OXYCODONE HCL 5 MG PO TABS
5.0000 mg | ORAL_TABLET | Freq: Once | ORAL | Status: DC | PRN
Start: 2020-10-02 — End: 2020-10-02

## 2020-10-02 MED ORDER — FENTANYL CITRATE (PF) 250 MCG/5ML IJ SOLN
INTRAMUSCULAR | Status: DC | PRN
  Administered 2020-10-02: 25 ug via INTRAVENOUS
  Administered 2020-10-02: 50 ug via INTRAVENOUS

## 2020-10-02 MED ORDER — ORAL CARE MOUTH RINSE: 15.0000 mL | Freq: Once | OROMUCOSAL | Status: AC

## 2020-10-02 MED ORDER — SUGAMMADEX SODIUM 200 MG/2ML IV SOLN
INTRAVENOUS | Status: DC | PRN
  Administered 2020-10-02: 200 mg via INTRAVENOUS

## 2020-10-02 MED ORDER — FAMOTIDINE 20 MG PO TABS
20.0000 mg | ORAL_TABLET | Freq: Once | ORAL | Status: AC
  Administered 2020-10-02: 20 mg via ORAL

## 2020-10-02 MED ORDER — LIDOCAINE HCL 4 % MT SOLN
OROMUCOSAL | Status: DC | PRN
  Administered 2020-10-02: 4 mL via TOPICAL

## 2020-10-02 MED ORDER — PROPOFOL 10 MG/ML IV BOLUS
INTRAVENOUS | Status: AC
  Filled 2020-10-02: qty 20

## 2020-10-02 MED ORDER — PROPOFOL 10 MG/ML IV BOLUS
INTRAVENOUS | Status: DC | PRN
  Administered 2020-10-02: 140 mg via INTRAVENOUS

## 2020-10-02 MED ORDER — DEXAMETHASONE SODIUM PHOSPHATE 10 MG/ML IJ SOLN
INTRAMUSCULAR | Status: DC | PRN
  Administered 2020-10-02: 10 mg via INTRAVENOUS

## 2020-10-02 MED ORDER — LIDOCAINE HCL (CARDIAC) PF 100 MG/5ML IV SOSY
PREFILLED_SYRINGE | INTRAVENOUS | Status: DC | PRN
  Administered 2020-10-02: 80 mg via INTRAVENOUS

## 2020-10-02 MED ORDER — ONDANSETRON HCL 4 MG/2ML IJ SOLN
INTRAMUSCULAR | Status: AC
  Filled 2020-10-02: qty 2

## 2020-10-02 MED ORDER — LIDOCAINE HCL (PF) 2 % IJ SOLN
INTRAMUSCULAR | Status: AC
  Filled 2020-10-02: qty 5

## 2020-10-02 MED ORDER — CIPROFLOXACIN IN D5W 400 MG/200ML IV SOLN
400.0000 mg | INTRAVENOUS | Status: AC
  Administered 2020-10-02: 400 mg via INTRAVENOUS

## 2020-10-02 MED ORDER — ROCURONIUM BROMIDE 100 MG/10ML IV SOLN
INTRAVENOUS | Status: DC | PRN
  Administered 2020-10-02: 10 mg via INTRAVENOUS
  Administered 2020-10-02: 50 mg via INTRAVENOUS

## 2020-10-02 MED ORDER — FENTANYL CITRATE (PF) 100 MCG/2ML IJ SOLN
INTRAMUSCULAR | Status: AC
  Filled 2020-10-02: qty 2

## 2020-10-02 MED ORDER — OXYCODONE HCL 5 MG/5ML PO SOLN: 5.0000 mg | Freq: Once | ORAL | Status: DC | PRN

## 2020-10-02 MED ORDER — PHENYLEPHRINE HCL (PRESSORS) 10 MG/ML IV SOLN
INTRAVENOUS | Status: DC | PRN
  Administered 2020-10-02: 100 ug via INTRAVENOUS
  Administered 2020-10-02 (×5): 200 ug via INTRAVENOUS
  Administered 2020-10-02 (×2): 100 ug via INTRAVENOUS
  Administered 2020-10-02 (×2): 200 ug via INTRAVENOUS
  Administered 2020-10-02: 100 ug via INTRAVENOUS

## 2020-10-02 MED ORDER — CHLORHEXIDINE GLUCONATE 0.12 % MT SOLN
OROMUCOSAL | Status: AC
  Filled 2020-10-02: qty 15

## 2020-10-02 MED ORDER — CIPROFLOXACIN IN D5W 400 MG/200ML IV SOLN
INTRAVENOUS | Status: AC
  Filled 2020-10-02: qty 200

## 2020-10-02 MED ORDER — BELLADONNA ALKALOIDS-OPIUM 16.2-60 MG RE SUPP
RECTAL | Status: DC | PRN
  Administered 2020-10-02: 1 via RECTAL

## 2020-10-02 MED ORDER — ONDANSETRON HCL 4 MG/2ML IJ SOLN: 4.0000 mg | Freq: Once | INTRAMUSCULAR | Status: DC | PRN

## 2020-10-02 MED ORDER — LACTATED RINGERS IV SOLN: INTRAVENOUS | Status: DC

## 2020-10-02 MED ORDER — ONDANSETRON HCL 4 MG/2ML IJ SOLN
INTRAMUSCULAR | Status: DC | PRN
  Administered 2020-10-02: 4 mg via INTRAVENOUS

## 2020-10-02 MED ORDER — MIDAZOLAM HCL 2 MG/2ML IJ SOLN
INTRAMUSCULAR | Status: DC | PRN
  Administered 2020-10-02: 2 mg via INTRAVENOUS

## 2020-10-02 MED ORDER — ROCURONIUM BROMIDE 10 MG/ML (PF) SYRINGE
PREFILLED_SYRINGE | INTRAVENOUS | Status: AC
  Filled 2020-10-02: qty 10

## 2020-10-02 MED ORDER — FENTANYL CITRATE (PF) 100 MCG/2ML IJ SOLN: 25.0000 ug | INTRAMUSCULAR | Status: DC | PRN

## 2020-10-02 MED ORDER — CHLORHEXIDINE GLUCONATE 0.12 % MT SOLN
15.0000 mL | Freq: Once | OROMUCOSAL | Status: AC
  Administered 2020-10-02: 15 mL via OROMUCOSAL

## 2020-10-02 MED ORDER — ACETAMINOPHEN 10 MG/ML IV SOLN: 1000.0000 mg | Freq: Once | INTRAVENOUS | Status: DC | PRN

## 2020-10-02 MED ORDER — CIPROFLOXACIN HCL 500 MG PO TABS: 500.0000 mg | ORAL_TABLET | Freq: Every day | ORAL | 0 refills | Status: DC

## 2020-10-02 MED ORDER — FAMOTIDINE 20 MG PO TABS
ORAL_TABLET | ORAL | Status: AC
  Filled 2020-10-02: qty 1

## 2020-10-02 MED ORDER — IOHEXOL 180 MG/ML  SOLN
INTRAMUSCULAR | Status: DC | PRN
  Administered 2020-10-02: 10 mL

## 2020-10-02 MED ORDER — MIDAZOLAM HCL 2 MG/2ML IJ SOLN
INTRAMUSCULAR | Status: AC
  Filled 2020-10-02: qty 2

## 2020-10-02 SURGICAL SUPPLY — 38 items
BAG DRAIN CYSTO-URO LG1000N (MISCELLANEOUS) ×2 IMPLANT
BAG DRN LRG CPC RND TRDRP CNTR (MISCELLANEOUS) ×1
BAG URO DRAIN 4000ML (MISCELLANEOUS) ×2 IMPLANT
BASKET ZERO TIP 1.9FR (BASKET) ×2 IMPLANT
BRUSH SCRUB EZ 1% IODOPHOR (MISCELLANEOUS) IMPLANT
BSKT STON RTRVL ZERO TP 1.9FR (BASKET) ×1
CATH FOL 2WAY LX 16X30 (CATHETERS) IMPLANT
CATH URETL 5X70 OPEN END (CATHETERS) IMPLANT
CNTNR SPEC 2.5X3XGRAD LEK (MISCELLANEOUS) ×1
CONT SPEC 4OZ STER OR WHT (MISCELLANEOUS) ×1
CONT SPEC 4OZ STRL OR WHT (MISCELLANEOUS) ×1
CONTAINER SPEC 2.5X3XGRAD LEK (MISCELLANEOUS) ×1 IMPLANT
DRAPE UTILITY 15X26 TOWEL STRL (DRAPES) ×2 IMPLANT
FIBER LASER TRAC TIP (UROLOGICAL SUPPLIES) IMPLANT
GLOVE BIOGEL PI IND STRL 7.5 (GLOVE) ×1 IMPLANT
GLOVE BIOGEL PI INDICATOR 7.5 (GLOVE) ×1
GOWN STRL REUS W/ TWL LRG LVL3 (GOWN DISPOSABLE) ×1 IMPLANT
GOWN STRL REUS W/ TWL XL LVL3 (GOWN DISPOSABLE) ×1 IMPLANT
GOWN STRL REUS W/TWL LRG LVL3 (GOWN DISPOSABLE) ×2
GOWN STRL REUS W/TWL XL LVL3 (GOWN DISPOSABLE) ×2
GUIDEWIRE STR DUAL SENSOR (WIRE) ×4 IMPLANT
INFUSOR MANOMETER BAG 3000ML (MISCELLANEOUS) ×2 IMPLANT
INTRODUCER DILATOR DOUBLE (INTRODUCER) IMPLANT
KIT TURNOVER CYSTO (KITS) ×2 IMPLANT
MANIFOLD NEPTUNE II (INSTRUMENTS) ×2 IMPLANT
PACK CYSTO AR (MISCELLANEOUS) ×2 IMPLANT
SET CYSTO W/LG BORE CLAMP LF (SET/KITS/TRAYS/PACK) ×2 IMPLANT
SHEATH URETERAL 12FRX35CM (MISCELLANEOUS) IMPLANT
SOL .9 NS 3000ML IRR  AL (IV SOLUTION) ×1
SOL .9 NS 3000ML IRR AL (IV SOLUTION) ×1
SOL .9 NS 3000ML IRR UROMATIC (IV SOLUTION) ×1 IMPLANT
STENT URET 6FRX24 CONTOUR (STENTS) IMPLANT
STENT URET 6FRX26 CONTOUR (STENTS) ×4 IMPLANT
SURGILUBE 2OZ TUBE FLIPTOP (MISCELLANEOUS) ×2 IMPLANT
SYR 10ML LL (SYRINGE) ×2 IMPLANT
TRACTIP FLEXIVA PULSE ID 200 (Laser) ×2 IMPLANT
VALVE UROSEAL ADJ ENDO (VALVE) ×2 IMPLANT
WATER STERILE IRR 1000ML POUR (IV SOLUTION) ×2 IMPLANT

## 2020-10-02 NOTE — Discharge Instructions (Signed)

## 2020-10-02 NOTE — Op Note (Signed)
Date of procedure: 10/02/20  Preoperative diagnosis:  1. Left ureteral stone 2. Right ureteral stone  Postoperative diagnosis:  1. Left ureteral stone 2. Right renal stone  Procedure: 1. Cystoscopy 2. Left ureteroscopy, laser lithotripsy, basket extraction of fragments, left retrograde pyelogram with intraoperative interpretation, left ureteral stent placement 3. Right ureteroscopy, laser lithotripsy of renal stone, right retrograde pyelogram with intraoperative interpretation, right ureteral stent placement with Dangler  Surgeon: Nickolas Madrid, MD  Anesthesia: General  Complications: None  Intraoperative findings:  1.  Normal cystoscopy, no suspicious lesions 2.  Left ureteral stone dusted and basket extracted, small left renal clots basket extracted 3.  Right proximal ureteral stone had passed, thorough pyeloscopy and ureteroscopy showed only a small 2 mm right lower pole stone that was dusted, retrograde clearly identified all calyces, no additional stone was seen 4.  Both ureters widely patent bilaterally  EBL: Minimal  Specimens: None  Drains: Bilateral 6 French by 26 cm ureteral stents, right ureteral stent left on Dangler  Indication: Stacie [name] is a 64 y.o. patient with history of sepsis from urinary source with bilateral ureteral stones who previously underwent emergent bilateral ureteral stent placement who presents today for definitive management.  After reviewing the management options for treatment, they elected to proceed with the above surgical procedure(s). We have discussed the potential benefits and risks of the procedure, side effects of the proposed treatment, the likelihood of the patient achieving the goals of the procedure, and any potential problems that might occur during the procedure or recuperation. Informed consent has been obtained.  Description of procedure:  The patient was taken to the operating room and general anesthesia was induced. SCDs  were placed for DVT prophylaxis. The patient was placed in the dorsal lithotomy position, prepped and draped in the usual sterile fashion, and preoperative antibiotics(Cipro) were administered. A preoperative time-out was performed.   A 21 French rigid cystoscope was used to intubate the urethra and thorough cystoscopy was performed.  The bladder was grossly normal with no suspicious lesions.  Stents emanated from both ureteral orifice ease.  I started on the left side and advanced a sensor wire alongside the stent up into the kidney.  The left ureteral stent was then grasped and removed.  The semirigid ureteroscope was advanced in the left ureter and in the mid to proximal ureter identified a 5 mm yellow stone.  This was partially fragmented using the 242 m laser fiber on settings of 0.5 J and 40 Hz, but the pieces were just out of reach.  A safety sensor wire was added to the scope and the scope removed.  I then changed over to the digital single channel ureteroscope and this was advanced easily over the wire up into the kidney.  Thorough pyeloscopy revealed a few small old blood clots in the renal pelvis, and the residual stone fragments were dusted in the proximal ureter on previously mentioned settings.  Thorough pyeloscopy revealed no other fragments.  I elected to basket extract the formed blood clots to prevent further obstruction from these in the future.  Thorough pyeloscopy at the conclusion revealed no other fragments or stones.  A retrograde pyelogram was performed from the proximal ureter and showed no extravasation or filling defects.  Careful pullback ureteroscopy showed no suspicious findings and the ureter appeared widely patent.  The rigid cystoscope was backloaded over the wire, and a 6 [place] by 26 cm ureteral stent was uneventfully placed with an excellent curl in the upper pole  as well as in the bladder.  I turned my attention to the right side and a sensor wire advanced alongside the  stent easily up into the kidney.  The stent was then grasped and pulled to the meatus, and a second safety wire added.  Single channel digital flexible ureteroscope was advanced over the wire up into the kidney easily under fluoroscopic vision.  Thorough pyeloscopy revealed only a small 2 mm lower pole stone, and this was fragmented to dust.  Thorough pyeloscopy revealed no evidence of the previous 5 mm stone that was in the proximal ureter.  Careful pullback ureteroscopy also showed no stones or ureteral abnormalities.  I performed a retrograde pyelogram from the proximal ureter which outline the collecting system with no extravasation or filling defects, and again was able to easily navigate into all calyces and no stone was seen.  Pullback ureteroscopy again showed no suspicious findings and the ureter was widely patent.  Under fluoroscopic vision a right 6 [place] by 26 cm ureteral stent was placed with an excellent curl in the renal pelvis, as well as in the bladder.  The Dangler was left intact and was secured to the suprapubic area with Tegaderm.  The bladder was drained and this concluded our procedure.  A belladonna suppository was placed.  Disposition: Stable to PACU  Plan: Remove right ureteral stent at home with Dangler on Monday We will arrange clinic stent removal of left ureteral stent on Wednesday Continue Cipro 500 mg daily prophylaxis with stent in place  Nickolas Madrid, MD

## 2020-10-02 NOTE — Anesthesia Preprocedure Evaluation (Signed)
Anesthesia Evaluation  Patient identified by MRN, date of birth, ID band Patient awake    Reviewed: Allergy & Precautions, H&P , NPO status , Patient's Chart, lab work & pertinent test results, reviewed documented beta blocker date and time   Airway Mallampati: III  TM Distance: >3 FB Neck ROM: full    Dental no notable dental hx. (+) Teeth Intact   Pulmonary asthma , sleep apnea, Continuous Positive Airway Pressure Ventilation and Oxygen sleep apnea , pneumonia, resolved, Patient abstained from smoking.Not current smoker,  Pulmonary sarcoidosis, pulmonary HTN On inhaled prostaglandins for pHTN. Her breathing is at her baseline today. On home O2 2L/min    + decreased breath sounds+ wheezing      Cardiovascular Exercise Tolerance: Poor hypertension, On Medications + Peripheral Vascular Disease  Normal cardiovascular exam+ Valvular Problems/Murmurs  Rhythm:regular Rate:Normal - Systolic murmurs    Neuro/Psych PSYCHIATRIC DISORDERS Depression CVA, No Residual Symptoms    GI/Hepatic Neg liver ROS, GERD  Medicated,  Endo/Other  Hypothyroidism   Renal/GU Renal disease  negative genitourinary   Musculoskeletal   Abdominal   Peds  Hematology negative hematology ROS (+)   Anesthesia Other Findings Past Medical History: No date: Arrhythmia     Comment:  patient unaware if this is current No date: Asthma No date: GERD (gastroesophageal reflux disease) No date: Heart murmur No date: History of kidney stones No date: HOH (hard of hearing)     Comment:  wear aids No date: Hypothyroidism No date: IBS (irritable bowel syndrome) No date: Pulmonary hypertension ([place]) No date: Sarcoid No date: Sarcoidosis No date: Seasonal allergies CPAP with O2: Sleep apnea No date: Wears hearing aid in both ears Past Surgical History: 10/18/2018: CARDIAC CATHETERIZATION     Comment:  [facility] 07/31/2019: CATARACT EXTRACTION W/PHACO; Left      Comment:  Procedure: CATARACT EXTRACTION PHACO AND INTRAOCULAR               LENS PLACEMENT (IOC) LEFT 00:51.1  17.9%  9.15;  Surgeon:              Leandrew Koyanagi, MD;  Location: [place];              Service: Ophthalmology;  Laterality: Left;  keep this               patient second No date: COLON SURGERY     Comment:  "colon was fused to bladder - operated on both" 09/18/2007: COLONOSCOPY     Comment:  diverticuli, no polyps 05/26/2010: COLONOSCOPY     Comment:  diverticuli, no polyps 07/14/2020: CYSTOSCOPY WITH STENT PLACEMENT; Bilateral     Comment:  Procedure: CYSTOSCOPY WITH STENT PLACEMENT,               RETROPYLOGRAM;  Surgeon: Billey Co, MD;  Location:              [facility];  Service: Urology;  Laterality: Bilateral; 02/20/2020: CYSTOSCOPY/URETEROSCOPY/HOLMIUM LASER/STENT PLACEMENT; Left     Comment:  Procedure: CYSTOSCOPY/URETEROSCOPY/LITHOTRIPSY /STENT               PLACEMENT;  Surgeon: Hollice Espy, MD;  Location: [facility]              [facility];  Service: Urology;  Laterality: Left; 05/20/2015: PARS PLANA VITRECTOMY; Right     Comment:  Procedure: PARS PLANA VITRECTOMY WITH 25 GAUGE, laser;                Surgeon: Milus Height, MD;  Location: [facility];                Service: Ophthalmology;  Laterality: Right; 1990: PARTIAL HYSTERECTOMY No date: TUBAL LIGATION BMI    Body Mass Index: 27.04 kg/m     Reproductive/Obstetrics negative OB ROS                             Anesthesia Physical  Anesthesia Plan  ASA: IV  Anesthesia Plan: General   Post-op Pain Management:    Induction: Intravenous  PONV Risk Score and Plan: 4 or greater and Ondansetron, Dexamethasone, Midazolam and Treatment may vary due to age or medical condition  Airway Management Planned: Oral ETT  Additional Equipment: None  Intra-op Plan:   Post-operative Plan: Extubation in OR  Informed Consent: I have reviewed the patients History and Physical,  chart, labs and discussed the procedure including the risks, benefits and alternatives for the proposed anesthesia with the patient or authorized representative who has indicated his/her understanding and acceptance.     Dental Advisory Given  Plan Discussed with: CRNA  Anesthesia Plan Comments: (Discussed risks of anesthesia with patient, including PONV, sore throat, lip/dental damage. Rare risks discussed as well, such as cardiorespiratory and neurological sequelae. Patient understands. Patient counseled on being higher risk for anesthesia due to comorbidities: pHTN, home O2 use. Patient was told about increased risk of cardiac and respiratory events, including death. Patient understands. )        Anesthesia Quick Evaluation

## 2020-10-02 NOTE — Anesthesia Procedure Notes (Signed)
Procedure Name: Intubation Date/Time: 10/02/2020 9:47 AM Performed by: Eben Burow, CRNA Pre-anesthesia Checklist: Patient identified, Suction available, Emergency Drugs available and Patient being monitored Patient Re-evaluated:Patient Re-evaluated prior to induction Oxygen Delivery Method: Circle system utilized Preoxygenation: Pre-oxygenation with 100% oxygen Induction Type: IV induction Ventilation: Mask ventilation without difficulty Laryngoscope Size: McGraph and 3 Grade View: Grade I Tube type: Oral Tube size: 6.5 mm Number of attempts: 1 Airway Equipment and Method: Stylet,  Video-laryngoscopy and LTA kit utilized Placement Confirmation: ETT inserted through vocal cords under direct vision,  positive ETCO2 and breath sounds checked- equal and bilateral Secured at: 21 cm Tube secured with: Tape Dental Injury: Teeth and Oropharynx as per pre-operative assessment

## 2020-10-02 NOTE — Transfer of Care (Signed)
Immediate Anesthesia Transfer of Care Note  Patient: Stacie [name]  Procedure(s) Performed: CYSTOSCOPY/URETEROSCOPY/HOLMIUM LASER/STENT PLACEMENT (Bilateral )  Patient Location: PACU  Anesthesia Type:General  Level of Consciousness: awake, alert , oriented and patient cooperative  Airway & Oxygen Therapy: Patient Spontanous Breathing and Patient connected to face mask oxygen  Post-op Assessment: Report given to RN and Post -op Vital signs reviewed and stable  Post vital signs: Reviewed and stable  Last Vitals:  Vitals Value Taken Time  BP 131/70 10/02/20 1105  Temp 37.2 C 10/02/20 1105  Pulse 102 10/02/20 1107  Resp 22 10/02/20 1107  SpO2 100 % 10/02/20 1107  Vitals shown include unvalidated device data.  Last Pain:  Vitals:   10/02/20 0831  TempSrc: Tympanic  PainSc: 0-No pain         Complications: No complications documented.

## 2020-10-02 NOTE — Interval H&P Note (Signed)
UROLOGY H&P UPDATE  Agree with prior H&P dated 09/16/20.  64 year old female with long hospitalization for sepsis from urinary source with bilateral ureteral stones who previously underwent emergent stenting in September 2021.  Here today for definitive stone management with ureteroscopy.  Cardiac: RRR Lungs: CTA bilaterally  Laterality: Bilateral Procedure: Bilateral ureteroscopy, laser lithotripsy, stent placement  Urine: Culture 12/10 with 10-25k mixed flora  We specifically discussed the risks ureteroscopy including bleeding, infection/sepsis, stent related symptoms including flank pain/urgency/frequency/incontinence/dysuria, ureteral injury, inability to access stone, or need for staged or additional procedures.   Billey Co, MD 10/02/2020

## 2020-10-02 NOTE — Anesthesia Postprocedure Evaluation (Signed)
Anesthesia Post Note  Patient: Stacie [name]  Procedure(s) Performed: CYSTOSCOPY/URETEROSCOPY/HOLMIUM LASER/STENT PLACEMENT (Bilateral )  Patient location during evaluation: PACU Anesthesia Type: General Level of consciousness: awake and alert Pain management: pain level controlled Vital Signs Assessment: post-procedure vital signs reviewed and stable Respiratory status: spontaneous breathing, nonlabored ventilation, respiratory function stable and patient connected to nasal cannula oxygen Cardiovascular status: blood pressure returned to baseline and stable Postop Assessment: no apparent nausea or vomiting Anesthetic complications: no   No complications documented.   Last Vitals:  Vitals:   10/02/20 1151 10/02/20 1211  BP: 124/70 122/62  Pulse: (!) 111 (!) 120  Resp: 16 (!) 22  Temp: 37.3 C   SpO2: 93% 90%    Last Pain:  Vitals:   10/02/20 1211  TempSrc:   PainSc: 0-No pain                 Arita Miss

## 2020-10-06 DIAGNOSIS — D860 Sarcoidosis of lung: Secondary | ICD-10-CM | POA: Diagnosis not present

## 2020-10-06 DIAGNOSIS — H9190 Unspecified hearing loss, unspecified ear: Secondary | ICD-10-CM | POA: Diagnosis not present

## 2020-10-06 DIAGNOSIS — E039 Hypothyroidism, unspecified: Secondary | ICD-10-CM | POA: Diagnosis not present

## 2020-10-06 DIAGNOSIS — I088 Other rheumatic multiple valve diseases: Secondary | ICD-10-CM | POA: Diagnosis not present

## 2020-10-06 DIAGNOSIS — Z4800 Encounter for change or removal of nonsurgical wound dressing: Secondary | ICD-10-CM | POA: Diagnosis not present

## 2020-10-06 DIAGNOSIS — N189 Chronic kidney disease, unspecified: Secondary | ICD-10-CM | POA: Diagnosis not present

## 2020-10-06 DIAGNOSIS — I1310 Hypertensive heart and chronic kidney disease without heart failure, with stage 1 through stage 4 chronic kidney disease, or unspecified chronic kidney disease: Secondary | ICD-10-CM | POA: Diagnosis not present

## 2020-10-06 DIAGNOSIS — R339 Retention of urine, unspecified: Secondary | ICD-10-CM | POA: Diagnosis not present

## 2020-10-06 DIAGNOSIS — D696 Thrombocytopenia, unspecified: Secondary | ICD-10-CM | POA: Diagnosis not present

## 2020-10-06 DIAGNOSIS — I272 Pulmonary hypertension, unspecified: Secondary | ICD-10-CM | POA: Diagnosis not present

## 2020-10-06 DIAGNOSIS — R739 Hyperglycemia, unspecified: Secondary | ICD-10-CM | POA: Diagnosis not present

## 2020-10-06 DIAGNOSIS — D62 Acute posthemorrhagic anemia: Secondary | ICD-10-CM | POA: Diagnosis not present

## 2020-10-06 DIAGNOSIS — I70223 Atherosclerosis of native arteries of extremities with rest pain, bilateral legs: Secondary | ICD-10-CM | POA: Diagnosis not present

## 2020-10-06 DIAGNOSIS — N179 Acute kidney failure, unspecified: Secondary | ICD-10-CM | POA: Diagnosis not present

## 2020-10-06 DIAGNOSIS — J45909 Unspecified asthma, uncomplicated: Secondary | ICD-10-CM | POA: Diagnosis not present

## 2020-10-06 DIAGNOSIS — T380X5D Adverse effect of glucocorticoids and synthetic analogues, subsequent encounter: Secondary | ICD-10-CM | POA: Diagnosis not present

## 2020-10-07 ENCOUNTER — Encounter: Payer: Self-pay | Admitting: Urology

## 2020-10-07 ENCOUNTER — Ambulatory Visit (INDEPENDENT_AMBULATORY_CARE_PROVIDER_SITE_OTHER): Payer: BC Managed Care – PPO | Admitting: Urology

## 2020-10-07 ENCOUNTER — Other Ambulatory Visit: Payer: Self-pay

## 2020-10-07 VITALS — BP 138/77 | HR 116 | Ht 68.0 in | Wt 172.0 lb

## 2020-10-07 DIAGNOSIS — N200 Calculus of kidney: Secondary | ICD-10-CM

## 2020-10-07 DIAGNOSIS — Z466 Encounter for fitting and adjustment of urinary device: Secondary | ICD-10-CM

## 2020-10-07 LAB — URINALYSIS, COMPLETE
Bilirubin, UA: NEGATIVE
Glucose, UA: NEGATIVE
Ketones, UA: NEGATIVE
Nitrite, UA: NEGATIVE
Specific Gravity, UA: 1.02 (ref 1.005–1.030)
Urobilinogen, Ur: 0.2 mg/dL (ref 0.2–1.0)
pH, UA: 6.5 (ref 5.0–7.5)

## 2020-10-07 LAB — MICROSCOPIC EXAMINATION: RBC, Urine: >30 /hpf — AB (ref 0–2)

## 2020-10-07 MED ORDER — LIDOCAINE HCL URETHRAL/MUCOSAL 2 % EX GEL
1.0000 "application " | Freq: Once | CUTANEOUS | Status: AC
  Administered 2020-10-07: 1 via URETHRAL

## 2020-10-07 MED ORDER — CIPROFLOXACIN HCL 500 MG PO TABS
500.0000 mg | ORAL_TABLET | Freq: Once | ORAL | Status: AC
  Administered 2020-10-07: 500 mg via ORAL

## 2020-10-07 NOTE — Patient Instructions (Addendum)
[facility] Instructions [facility]  You will receive a box/kit in the mail that will have a urine jug and instructions in the kit.  When the box arrives you will need to call our office [phone] to schedule a LAB appointment.  You will need to do a 24hour urine and this should be done during the days that our office will be open.  For example any day from Sunday through Thursday.  If you take Vitamin C 172m or greater please stop this 5 days prior to collection.  How to collect the urine sample: On the day you start the urine sample this 1st morning urine should NOT be collected.  For the rest of the day including all night urines should be collected.  On the next morning the 1st urine should be collected and then you will be finished with the urine collections.  You will need to bring the box with you on your LAB appointment day after urine has been collected and all instructions are complete in the box.  Your blood will be drawn and the box will be collected by our Lab employee to be sent off for analysis.  When urine and blood is complete you will need to schedule a follow up appointment for lab results.     Ureteral Stent Implantation, Care After This sheet gives you information about how to care for yourself after your procedure. Your health care provider may also give you more specific instructions. If you have problems or questions, contact your health care provider. What can I expect after the procedure? After the procedure, it is common to have:  Nausea.  Mild pain when you urinate. You may feel this pain in your lower back or lower abdomen. The pain should stop within a few minutes after you urinate. This may last for up to 1 week.  A small amount of blood in your urine for several days. Follow these instructions at home: Medicines  Take over-the-counter and prescription medicines only as told by your health care provider.  If you were prescribed  an antibiotic medicine, take it as told by your health care provider. Do not stop taking the antibiotic even if you start to feel better.  Do not drive for 24 hours if you were given a sedative during your procedure.  Ask your health care provider if the medicine prescribed to you requires you to avoid driving or using heavy machinery. Activity  Rest as told by your health care provider.  Avoid sitting for a long time without moving. Get up to take short walks every 1-2 hours. This is important to improve blood flow and breathing. Ask for help if you feel weak or unsteady.  Return to your normal activities as told by your health care provider. Ask your health care provider what activities are safe for you. General instructions   Watch for any blood in your urine. Call your health care provider if the amount of blood in your urine increases.  If you have a catheter: ? Follow instructions from your health care provider about taking care of your catheter and collection bag. ? Do not take baths, swim, or use a hot tub until your health care provider approves. Ask your health care provider if you may take showers. You may only be allowed to take sponge baths.  Drink enough fluid to keep your urine pale yellow.  Do not use any products that contain nicotine or tobacco, such as cigarettes, e-cigarettes, and chewing  tobacco. These can delay healing after surgery. If you need help quitting, ask your health care provider.  Keep all follow-up visits as told by your health care provider. This is important. Contact a health care provider if:  You have pain that gets worse or does not get better with medicine, especially pain when you urinate.  You have difficulty urinating.  You feel nauseous or you vomit repeatedly during a period of more than 2 days after the procedure. Get help right away if:  Your urine is dark red or has blood clots in it.  You are leaking urine (have  incontinence).  The end of the stent comes out of your urethra.  You cannot urinate.  You have sudden, sharp, or severe pain in your abdomen or lower back.  You have a fever.  You have swelling or pain in your legs.  You have difficulty breathing. Summary  After the procedure, it is common to have mild pain when you urinate that goes away within a few minutes after you urinate. This may last for up to 1 week.  Watch for any blood in your urine. Call your health care provider if the amount of blood in your urine increases.  Take over-the-counter and prescription medicines only as told by your health care provider.  Drink enough fluid to keep your urine pale yellow. This information is not intended to replace advice given to you by your health care provider. Make sure you discuss any questions you have with your health care provider. Document Revised: 07/10/2018 Document Reviewed: 07/11/2018 Elsevier Patient Education  2020 [employer].  Dietary Guidelines to Help Prevent Kidney Stones Kidney stones are deposits of minerals and salts that form inside your kidneys. Your risk of developing kidney stones may be greater depending on your diet, your lifestyle, the medicines you take, and whether you have certain medical conditions. Most people can reduce their chances of developing kidney stones by following the instructions below. Depending on your overall health and the type of kidney stones you tend to develop, your dietitian may give you more specific instructions. What are tips for following this plan? Reading food labels  Choose foods with "no salt added" or "low-salt" labels. Limit your sodium intake to less than 1500 mg per day.  Choose foods with calcium for each meal and snack. Try to eat about 300 mg of calcium at each meal. Foods that contain 200-500 mg of calcium per serving include: ? 8 oz (237 ml) of milk, fortified nondairy milk, and fortified fruit juice. ? 8 oz (237  ml) of kefir, yogurt, and soy yogurt. ? 4 oz (118 ml) of tofu. ? 1 oz of cheese. ? 1 cup (300 g) of dried figs. ? 1 cup (91 g) of cooked broccoli. ? 1-3 oz can of sardines or mackerel.  Most people need 1000 to 1500 mg of calcium each day. Talk to your dietitian about how much calcium is recommended for you. Shopping  Buy plenty of fresh fruits and vegetables. Most people do not need to avoid fruits and vegetables, even if they contain nutrients that may contribute to kidney stones.  When shopping for convenience foods, choose: ? Whole pieces of fruit. ? Premade salads with dressing on the side. ? Low-fat fruit and yogurt smoothies.  Avoid buying frozen meals or prepared deli foods.  Look for foods with live cultures, such as yogurt and kefir. Cooking  Do not add salt to food when cooking. Place a salt shaker on  the table and allow each person to add his or her own salt to taste.  Use vegetable protein, such as beans, textured vegetable protein (TVP), or tofu instead of meat in pasta, casseroles, and soups. Meal planning   Eat less salt, if told by your dietitian. To do this: ? Avoid eating processed or premade food. ? Avoid eating fast food.  Eat less animal protein, including cheese, meat, poultry, or fish, if told by your dietitian. To do this: ? Limit the number of times you have meat, poultry, fish, or cheese each week. Eat a diet free of meat at least 2 days a week. ? Eat only one serving each day of meat, poultry, fish, or seafood. ? When you prepare animal protein, cut pieces into small portion sizes. For most meat and fish, one serving is about the size of one deck of cards.  Eat at least 5 servings of fresh fruits and vegetables each day. To do this: ? Keep fruits and vegetables on hand for snacks. ? Eat 1 piece of fruit or a handful of berries with breakfast. ? Have a salad and fruit at lunch. ? Have two kinds of vegetables at dinner.  Limit foods that are high  in a substance called oxalate. These include: ? Spinach. ? Rhubarb. ? Beets. ? Potato chips and french fries. ? Nuts.  If you regularly take a diuretic medicine, make sure to eat at least 1-2 fruits or vegetables high in potassium each day. These include: ? Avocado. ? Banana. ? Orange, prune, carrot, or tomato juice. ? Baked potato. ? Cabbage. ? Beans and split peas. General instructions   Drink enough fluid to keep your urine clear or pale yellow. This is the most important thing you can do.  Talk to your health care provider and dietitian about taking daily supplements. Depending on your health and the cause of your kidney stones, you may be advised: ? Not to take supplements with vitamin C. ? To take a calcium supplement. ? To take a daily probiotic supplement. ? To take other supplements such as magnesium, fish oil, or vitamin B6.  Take all medicines and supplements as told by your health care provider.  Limit alcohol intake to no more than 1 drink a day for nonpregnant women and 2 drinks a day for men. One drink equals 12 oz of beer, 5 oz of wine, or 1 oz of hard liquor.  Lose weight if told by your health care provider. Work with your dietitian to find strategies and an eating plan that works best for you. What foods are not recommended? Limit your intake of the following foods, or as told by your dietitian. Talk to your dietitian about specific foods you should avoid based on the type of kidney stones and your overall health. Grains Breads. Bagels. Rolls. Baked goods. Salted crackers. Cereal. Pasta. Vegetables Spinach. Rhubarb. Beets. Canned vegetables. Angie Fava. Olives. Meats and other protein foods Nuts. Nut butters. Large portions of meat, poultry, or fish. Salted or cured meats. Deli meats. Hot dogs. Sausages. Dairy Cheese. Beverages Regular soft drinks. Regular vegetable juice. Seasonings and other foods Seasoning blends with salt. Salad dressings. Canned soups.  Soy sauce. Ketchup. Barbecue sauce. Canned pasta sauce. Casseroles. Pizza. Lasagna. Frozen meals. Potato chips. [place] fries. Summary  You can reduce your risk of kidney stones by making changes to your diet.  The most important thing you can do is drink enough fluid. You should drink enough fluid to keep your urine clear  or pale yellow.  Ask your health care provider or dietitian how much protein from animal sources you should eat each day, and also how much salt and calcium you should have each day. This information is not intended to replace advice given to you by your health care provider. Make sure you discuss any questions you have with your health care provider. Document Revised: 01/23/2019 Document Reviewed: 09/13/2016 Elsevier Patient Education  2020 [employer].

## 2020-10-07 NOTE — Progress Notes (Signed)
Cystoscopy Procedure Note:  Indication: Stent removal s/p bilateral ureteroscopy, laser lithotripsy, stent placement.    Right ureteral stent was removed at home on Monday.  Cipro given for prophylaxis, UA benign in clinic today  After informed consent and discussion of the procedure and its risks, Stacie [name] was positioned and prepped in the standard fashion. Cystoscopy was performed with a flexible cystoscope. The stent was grasped with flexible graspers and removed in its entirety. The patient tolerated the procedure well.  Findings: Uncomplicated stent removal  Assessment and Plan: Follow up in 4 weeks with renal ultrasound to evaluate for silent hydronephrosis and 24 hour urine for metabolic workup  Billey Co, MD 10/07/2020

## 2020-10-13 DIAGNOSIS — I272 Pulmonary hypertension, unspecified: Secondary | ICD-10-CM | POA: Diagnosis not present

## 2020-10-13 DIAGNOSIS — Z4800 Encounter for change or removal of nonsurgical wound dressing: Secondary | ICD-10-CM | POA: Diagnosis not present

## 2020-10-13 DIAGNOSIS — I70223 Atherosclerosis of native arteries of extremities with rest pain, bilateral legs: Secondary | ICD-10-CM | POA: Diagnosis not present

## 2020-10-13 DIAGNOSIS — N189 Chronic kidney disease, unspecified: Secondary | ICD-10-CM | POA: Diagnosis not present

## 2020-10-13 DIAGNOSIS — R339 Retention of urine, unspecified: Secondary | ICD-10-CM | POA: Diagnosis not present

## 2020-10-13 DIAGNOSIS — T380X5D Adverse effect of glucocorticoids and synthetic analogues, subsequent encounter: Secondary | ICD-10-CM | POA: Diagnosis not present

## 2020-10-13 DIAGNOSIS — D696 Thrombocytopenia, unspecified: Secondary | ICD-10-CM | POA: Diagnosis not present

## 2020-10-13 DIAGNOSIS — I1310 Hypertensive heart and chronic kidney disease without heart failure, with stage 1 through stage 4 chronic kidney disease, or unspecified chronic kidney disease: Secondary | ICD-10-CM | POA: Diagnosis not present

## 2020-10-13 DIAGNOSIS — N179 Acute kidney failure, unspecified: Secondary | ICD-10-CM | POA: Diagnosis not present

## 2020-10-13 DIAGNOSIS — D62 Acute posthemorrhagic anemia: Secondary | ICD-10-CM | POA: Diagnosis not present

## 2020-10-13 DIAGNOSIS — I088 Other rheumatic multiple valve diseases: Secondary | ICD-10-CM | POA: Diagnosis not present

## 2020-10-13 DIAGNOSIS — R739 Hyperglycemia, unspecified: Secondary | ICD-10-CM | POA: Diagnosis not present

## 2020-10-13 DIAGNOSIS — D860 Sarcoidosis of lung: Secondary | ICD-10-CM | POA: Diagnosis not present

## 2020-10-13 DIAGNOSIS — H9190 Unspecified hearing loss, unspecified ear: Secondary | ICD-10-CM | POA: Diagnosis not present

## 2020-10-13 DIAGNOSIS — E039 Hypothyroidism, unspecified: Secondary | ICD-10-CM | POA: Diagnosis not present

## 2020-10-13 DIAGNOSIS — J45909 Unspecified asthma, uncomplicated: Secondary | ICD-10-CM | POA: Diagnosis not present

## 2020-10-14 ENCOUNTER — Other Ambulatory Visit: Payer: BC Managed Care – PPO | Admitting: Urology

## 2020-10-14 DIAGNOSIS — I6340 Cerebral infarction due to embolism of unspecified cerebral artery: Secondary | ICD-10-CM | POA: Diagnosis not present

## 2020-10-21 DIAGNOSIS — M359 Systemic involvement of connective tissue, unspecified: Secondary | ICD-10-CM | POA: Diagnosis not present

## 2020-10-21 DIAGNOSIS — I2721 Secondary pulmonary arterial hypertension: Secondary | ICD-10-CM | POA: Diagnosis not present

## 2020-10-23 DIAGNOSIS — R339 Retention of urine, unspecified: Secondary | ICD-10-CM | POA: Diagnosis not present

## 2020-10-23 DIAGNOSIS — T380X5D Adverse effect of glucocorticoids and synthetic analogues, subsequent encounter: Secondary | ICD-10-CM | POA: Diagnosis not present

## 2020-10-23 DIAGNOSIS — H9190 Unspecified hearing loss, unspecified ear: Secondary | ICD-10-CM | POA: Diagnosis not present

## 2020-10-23 DIAGNOSIS — D62 Acute posthemorrhagic anemia: Secondary | ICD-10-CM | POA: Diagnosis not present

## 2020-10-23 DIAGNOSIS — I088 Other rheumatic multiple valve diseases: Secondary | ICD-10-CM | POA: Diagnosis not present

## 2020-10-23 DIAGNOSIS — D860 Sarcoidosis of lung: Secondary | ICD-10-CM | POA: Diagnosis not present

## 2020-10-23 DIAGNOSIS — J45909 Unspecified asthma, uncomplicated: Secondary | ICD-10-CM | POA: Diagnosis not present

## 2020-10-23 DIAGNOSIS — R739 Hyperglycemia, unspecified: Secondary | ICD-10-CM | POA: Diagnosis not present

## 2020-10-23 DIAGNOSIS — I70223 Atherosclerosis of native arteries of extremities with rest pain, bilateral legs: Secondary | ICD-10-CM | POA: Diagnosis not present

## 2020-10-23 DIAGNOSIS — N179 Acute kidney failure, unspecified: Secondary | ICD-10-CM | POA: Diagnosis not present

## 2020-10-23 DIAGNOSIS — N189 Chronic kidney disease, unspecified: Secondary | ICD-10-CM | POA: Diagnosis not present

## 2020-10-23 DIAGNOSIS — E039 Hypothyroidism, unspecified: Secondary | ICD-10-CM | POA: Diagnosis not present

## 2020-10-23 DIAGNOSIS — I272 Pulmonary hypertension, unspecified: Secondary | ICD-10-CM | POA: Diagnosis not present

## 2020-10-23 DIAGNOSIS — D696 Thrombocytopenia, unspecified: Secondary | ICD-10-CM | POA: Diagnosis not present

## 2020-10-23 DIAGNOSIS — I1310 Hypertensive heart and chronic kidney disease without heart failure, with stage 1 through stage 4 chronic kidney disease, or unspecified chronic kidney disease: Secondary | ICD-10-CM | POA: Diagnosis not present

## 2020-10-23 DIAGNOSIS — Z4800 Encounter for change or removal of nonsurgical wound dressing: Secondary | ICD-10-CM | POA: Diagnosis not present

## 2020-10-24 DIAGNOSIS — G4733 Obstructive sleep apnea (adult) (pediatric): Secondary | ICD-10-CM | POA: Diagnosis not present

## 2020-10-28 ENCOUNTER — Telehealth: Payer: Self-pay | Admitting: Urology

## 2020-10-28 NOTE — Telephone Encounter (Signed)
I called and spoke with the patient,She said she did not speak with anybody he just came in. She said she does NOT have any UTI symptoms that she has chills and a fever and thinks she has a cold I advised her to see her PCP for that she agreed.  Sharyn Lull

## 2020-10-28 NOTE — Telephone Encounter (Signed)
Dr Kathyrn Sheriff stopped by to get a urine cup to get sample from pt.  He said he spoke w/someone this morning and told them pt woke up with a fever and is having symptoms of UTI.  He will bring back this afternoon.

## 2020-10-29 ENCOUNTER — Other Ambulatory Visit (INDEPENDENT_AMBULATORY_CARE_PROVIDER_SITE_OTHER): Payer: BC Managed Care – PPO | Admitting: Internal Medicine

## 2020-10-29 DIAGNOSIS — N183 Chronic kidney disease, stage 3 unspecified: Secondary | ICD-10-CM | POA: Diagnosis not present

## 2020-10-29 DIAGNOSIS — Z9181 History of falling: Secondary | ICD-10-CM

## 2020-10-29 DIAGNOSIS — Z9981 Dependence on supplemental oxygen: Secondary | ICD-10-CM

## 2020-10-29 DIAGNOSIS — K581 Irritable bowel syndrome with constipation: Secondary | ICD-10-CM

## 2020-10-29 DIAGNOSIS — I088 Other rheumatic multiple valve diseases: Secondary | ICD-10-CM

## 2020-10-29 DIAGNOSIS — G4733 Obstructive sleep apnea (adult) (pediatric): Secondary | ICD-10-CM

## 2020-10-29 DIAGNOSIS — T380X5D Adverse effect of glucocorticoids and synthetic analogues, subsequent encounter: Secondary | ICD-10-CM

## 2020-10-29 DIAGNOSIS — R739 Hyperglycemia, unspecified: Secondary | ICD-10-CM

## 2020-10-29 DIAGNOSIS — D860 Sarcoidosis of lung: Secondary | ICD-10-CM

## 2020-10-29 DIAGNOSIS — R339 Retention of urine, unspecified: Secondary | ICD-10-CM

## 2020-10-29 DIAGNOSIS — I70223 Atherosclerosis of native arteries of extremities with rest pain, bilateral legs: Secondary | ICD-10-CM

## 2020-10-29 DIAGNOSIS — I272 Pulmonary hypertension, unspecified: Secondary | ICD-10-CM

## 2020-10-29 DIAGNOSIS — H9190 Unspecified hearing loss, unspecified ear: Secondary | ICD-10-CM

## 2020-10-29 DIAGNOSIS — E782 Mixed hyperlipidemia: Secondary | ICD-10-CM

## 2020-10-29 DIAGNOSIS — D62 Acute posthemorrhagic anemia: Secondary | ICD-10-CM | POA: Diagnosis not present

## 2020-10-29 DIAGNOSIS — Z4800 Encounter for change or removal of nonsurgical wound dressing: Secondary | ICD-10-CM

## 2020-10-29 DIAGNOSIS — N289 Disorder of kidney and ureter, unspecified: Secondary | ICD-10-CM

## 2020-10-29 DIAGNOSIS — J4540 Moderate persistent asthma, uncomplicated: Secondary | ICD-10-CM

## 2020-10-29 DIAGNOSIS — D696 Thrombocytopenia, unspecified: Secondary | ICD-10-CM

## 2020-10-29 DIAGNOSIS — I1310 Hypertensive heart and chronic kidney disease without heart failure, with stage 1 through stage 4 chronic kidney disease, or unspecified chronic kidney disease: Secondary | ICD-10-CM | POA: Diagnosis not present

## 2020-10-29 NOTE — Progress Notes (Signed)
Received orders from [facility]. Start of care 08/17/20.   Orders from 10/16/20 through 12/14/20 are reviewed, signed and faxed.

## 2020-11-03 ENCOUNTER — Encounter: Payer: Self-pay | Admitting: Internal Medicine

## 2020-11-03 ENCOUNTER — Other Ambulatory Visit: Payer: Self-pay

## 2020-11-03 ENCOUNTER — Ambulatory Visit (INDEPENDENT_AMBULATORY_CARE_PROVIDER_SITE_OTHER): Payer: BC Managed Care – PPO | Admitting: Vascular Surgery

## 2020-11-03 VITALS — BP 146/80 | HR 116 | Ht 67.0 in | Wt 172.0 lb

## 2020-11-03 DIAGNOSIS — E782 Mixed hyperlipidemia: Secondary | ICD-10-CM

## 2020-11-03 DIAGNOSIS — N183 Chronic kidney disease, stage 3 unspecified: Secondary | ICD-10-CM

## 2020-11-03 DIAGNOSIS — I70223 Atherosclerosis of native arteries of extremities with rest pain, bilateral legs: Secondary | ICD-10-CM

## 2020-11-03 DIAGNOSIS — I272 Pulmonary hypertension, unspecified: Secondary | ICD-10-CM | POA: Diagnosis not present

## 2020-11-03 DIAGNOSIS — I96 Gangrene, not elsewhere classified: Secondary | ICD-10-CM | POA: Diagnosis not present

## 2020-11-03 DIAGNOSIS — I10 Essential (primary) hypertension: Secondary | ICD-10-CM

## 2020-11-03 NOTE — Progress Notes (Signed)
MRN : [id number]  Stacie [name] is a 65 y.o. (1956/06/15) female who presents with chief complaint of  Chief Complaint  Patient presents with  . Follow-up    52mono studies  .  History of Present Illness: Patient returns today in follow up of gangrenous toes.  Her right foot just has the tip of the right great toe which is gangrenous which has regressed significantly.  This is clean and dry with no signs of infection or drainage.  The left foot is the more severely affected of the 2 legs.  Her second and third toes are gangrenous down to the base of the toes with the metatarsal head exposed on the second toe.  This is nearing exposure on the first toe and the third toe as well.  The first toe is gangrenous as well although not all the way to the base of the toe.  The fourth and fifth toes have healed well.  The wound on the dorsal aspect of the foot is healing well with good granulation tissue.  There is a bit of a V shape on the base with good skin around the plantar aspect of the wound.  There is no erythema.  No purulent drainage or signs of infection.  Her husband is taking incredibly good care of her wounds.  Current Outpatient Medications  Medication Sig Dispense Refill  . azithromycin (ZITHROMAX) 250 MG tablet Take by mouth daily.    .Marland Kitchenacetaminophen (TYLENOL) 325 MG tablet Take 2 tablets (650 mg total) by mouth every 4 (four) hours as needed for mild pain or fever.    .Marland Kitchenaspirin EC 81 MG EC tablet Take 1 tablet (81 mg total) by mouth daily. Swallow whole. 30 tablet 11  . atorvastatin (LIPITOR) 40 MG tablet Take 1 tablet (40 mg total) by mouth daily. 90 tablet 3  . budesonide-formoterol (SYMBICORT) 80-4.5 MCG/ACT inhaler Take 2 puffs first thing in am and then another 2 puffs about 12 hours later. (Patient taking differently: Inhale 2 puffs into the lungs in the morning and at bedtime.) 6.9 g 1  . ciprofloxacin (CIPRO) 500 MG tablet Take 1 tablet (500 mg total) by mouth daily. 6 tablet  0  . denosumab (PROLIA) 60 MG/ML SOSY injection Inject 60 mg into the skin every 6 (six) months.    . dorzolamide (TRUSOPT) 2 % ophthalmic solution Place 1 drop into both eyes 2 (two) times daily.    .Marland Kitchenescitalopram (LEXAPRO) 20 MG tablet Take 1 tablet (20 mg total) by mouth daily. 30 tablet 0  . folic acid (FOLVITE) 1 MG tablet Take 1 tablet (1 mg total) by mouth daily. 30 tablet 11  . latanoprost (XALATAN) 0.005 % ophthalmic solution Place 1 drop into the right eye at bedtime.    .Marland Kitchenlevothyroxine (SYNTHROID) 137 MCG tablet TAKE 1 TABLET (137 MCG TOTAL) BY MOUTH DAILY BEFORE BREAKFAST. (Patient taking differently: Take 137 mcg by mouth daily before breakfast.) 90 tablet 0  . methotrexate (RHEUMATREX) 2.5 MG tablet Take by mouth.    . methotrexate (RHEUMATREX) 5 MG tablet Take 5 tablets (25 mg total) by mouth once a week. Caution:Chemotherapy. Protect from light. 4 tablet 0  . Multiple Vitamins-Minerals (MULTIVITAMIN WITH MINERALS) tablet Take 1 tablet by mouth daily.    . NON FORMULARY CPAP nightly    . OXYGEN Inhale 2 L into the lungs daily as needed (During walking and activity).     . predniSONE (DELTASONE) 10 MG tablet Take 10  mg by mouth daily.    . predniSONE (DELTASONE) 5 MG tablet Take 3 tablets (15 mg total) by mouth daily with breakfast. 60 tablet 0  . Treprostinil (TYVASO) 0.6 MG/ML SOLN Inhale 0.6 mcg into the lungs See admin instructions. 12 breaths 4 times a day     No current facility-administered medications for this visit.    Past Medical History:  Diagnosis Date  . Acute respiratory failure with hypoxia ([place])   . Arrhythmia    patient unaware if this is current  . Asthma   . Chronic kidney disease   . Depression   . GERD (gastroesophageal reflux disease)   . Heart murmur   . History of kidney stones   . HOH (hard of hearing)    wear aids  . Hyperthyroidism   . Hypothyroidism   . IBS (irritable bowel syndrome)   . Pneumonia   . Pulmonary hypertension ([place])   .  Sarcoid   . Sarcoidosis   . Seasonal allergies   . Sleep apnea CPAP with O2  . Stroke ([place]) 07/2020   [facility]  . Wears hearing aid in both ears     Past Surgical History:  Procedure Laterality Date  . ABDOMINAL HYSTERECTOMY     partial  . CARDIAC CATHETERIZATION  10/18/2018   [facility]  . CATARACT EXTRACTION W/PHACO Left 07/31/2019   Procedure: CATARACT EXTRACTION PHACO AND INTRAOCULAR LENS PLACEMENT (IOC) LEFT 00:51.1  17.9%  9.15;  Surgeon: Leandrew Koyanagi, MD;  Location: [facility];  Service: Ophthalmology;  Laterality: Left;  keep this patient second  . COLON SURGERY     "colon was fused to bladder - operated on both"  . COLONOSCOPY  09/18/2007   diverticuli, no polyps  . COLONOSCOPY  05/26/2010   diverticuli, no polyps  . CYSTOSCOPY WITH STENT PLACEMENT Bilateral 07/14/2020   Procedure: CYSTOSCOPY WITH STENT PLACEMENT, RETROPYLOGRAM;  Surgeon: Billey Co, MD;  Location: [facility];  Service: Urology;  Laterality: Bilateral;  . CYSTOSCOPY/URETEROSCOPY/HOLMIUM LASER/STENT PLACEMENT Left 02/20/2020   Procedure: CYSTOSCOPY/URETEROSCOPY/LITHOTRIPSY /STENT PLACEMENT;  Surgeon: Hollice Espy, MD;  Location: [facility];  Service: Urology;  Laterality: Left;  . CYSTOSCOPY/URETEROSCOPY/HOLMIUM LASER/STENT PLACEMENT Bilateral 10/02/2020   Procedure: CYSTOSCOPY/URETEROSCOPY/HOLMIUM LASER/STENT PLACEMENT;  Surgeon: Billey Co, MD;  Location: [facility];  Service: Urology;  Laterality: Bilateral;  . EYE SURGERY    . PARS PLANA VITRECTOMY Right 05/20/2015   Procedure: PARS PLANA VITRECTOMY WITH 25 GAUGE, laser;  Surgeon: Milus Height, MD;  Location: [facility];  Service: Ophthalmology;  Laterality: Right;  . PARTIAL HYSTERECTOMY  1990  . TEE WITHOUT CARDIOVERSION N/A 07/27/2020   Procedure: TRANSESOPHAGEAL ECHOCARDIOGRAM (TEE);  Surgeon: Teodoro Spray, MD;  Location: [facility];  Service: Cardiovascular;  Laterality: N/A;  . TUBAL LIGATION    . WISDOM TOOTH EXTRACTION        Social History   Tobacco Use  . Smoking status: Never Smoker  . Smokeless tobacco: Never Used  Vaping Use  . Vaping Use: Never used  Substance Use Topics  . Alcohol use: No  . Drug use: No      Family History  Problem Relation Age of Onset  . Allergies Father   . Asthma Father   . Colon cancer Father   . Allergies Brother   . Asthma Brother   . Breast cancer Maternal Grandmother     Allergies  Allergen Reactions  . Nitrofurantoin Nausea Only  . Tramadol     Nausea and vomiting  . Sulfa Antibiotics  Rash    As an infant     REVIEW OF SYSTEMS (Negative unless checked)  Constitutional: _0 Weight loss  _1 Fever  _2 Chills Cardiac: _3 Chest pain   _4 Chest pressure   _5 Palpitations   _6 Shortness of breath when laying flat   _7 Shortness of breath at rest   _8 Shortness of breath with exertion. Vascular:  _9 Pain in legs with walking   _10 Pain in legs at rest   _11 Pain in legs when laying flat   _12 Claudication   _13 Pain in feet when walking  _14 Pain in feet at rest  _15 Pain in feet when laying flat   _16 History of DVT   _17 Phlebitis   _18 Swelling in legs   _19 Varicose veins   _20 Non-healing ulcers Pulmonary:   _21 Uses home oxygen   _22 Productive cough   _23 Hemoptysis   _24 Wheeze  _25 COPD   _26 Asthma Neurologic:  _27 Dizziness  _28 Blackouts   _29 Seizures   _30 History of stroke   _31 History of TIA  _32 Aphasia   _33 Temporary blindness   _34 Dysphagia   _35 Weakness or numbness in arms   _36 Weakness or numbness in legs Musculoskeletal:  _37 Arthritis   _38 Joint swelling   _39 Joint pain   _40 Low back pain Hematologic:  _41 Easy bruising  _42 Easy bleeding   _43 Hypercoagulable state   _44 Anemic   Gastrointestinal:  _45 Blood in stool   _46 Vomiting blood  _47 Gastroesophageal reflux/heartburn   _48 Abdominal pain Genitourinary:  _49 Chronic kidney disease   _50 Difficult urination  _51 Frequent urination  _52 Burning with urination   _53 Hematuria Skin:  _54 Rashes   _55 Ulcers   _56 Wounds Psychological:  _57 History of anxiety   _58  History  of major depression.  Physical Examination  BP (!) 146/80   Pulse (!) 116   Ht _59  (1.702 m)   Wt 172 lb (78 kg)   BMI 26.94 kg/m  Gen:  WD/WN, NAD Head: [place]/AT, No temporalis wasting. Ear/Nose/Throat: Hearing grossly intact, nares w/o erythema or drainage Eyes: Conjunctiva clear. Sclera non-icteric Neck: Supple.  Trachea midline Pulmonary:  Good air movement, no use of accessory muscles on supplemental oxygen.  Cardiac: Tachycardic Vascular:  Vessel Right Left  Radial Palpable Palpable       Musculoskeletal: M/S 5/5 throughout.  Gangrenous changes to the feet as described above.  Significantly more so on the left than the right.  The distal third of the right great toe has dry gangrene with no erythema or drainage.  On the left, the first second and third toes are involved with complete involvement of the second and third toes.  The wounds on the dorsal aspect of the foot are healing without signs of infection. Neurologic: Sensation grossly intact in extremities.  Symmetrical.  Speech is fluent.  Psychiatric: Judgment intact, Mood & affect appropriate for pt's clinical situation. Dermatologic: Bilateral foot and toe changes as described above       Labs Recent Results (from the past 2160 hour(s))  Glucose, capillary     Status: Abnormal   Collection Time: 08/05/20  4:43 PM  Result Value Ref Range   Glucose-Capillary 137 (H) 70 - 99 mg/dL    Comment: Glucose reference range applies only to samples taken after fasting for at least 8 hours.  Glucose, capillary     Status: Abnormal   Collection Time: 08/05/20  8:51 PM  Result Value Ref Range   Glucose-Capillary 168 (H) 70 - 99 mg/dL    Comment: Glucose reference range applies only to samples taken after fasting for at least 8 hours.  Glucose, capillary     Status: None  Collection Time: 08/06/20  6:14 AM  Result Value Ref Range   Glucose-Capillary 95 70 - 99 mg/dL    Comment: Glucose reference range applies only to  samples taken after fasting for at least 8 hours.  Glucose, capillary     Status: Abnormal   Collection Time: 08/06/20 11:31 AM  Result Value Ref Range   Glucose-Capillary 132 (H) 70 - 99 mg/dL    Comment: Glucose reference range applies only to samples taken after fasting for at least 8 hours.  Glucose, capillary     Status: Abnormal   Collection Time: 08/06/20  4:42 PM  Result Value Ref Range   Glucose-Capillary 138 (H) 70 - 99 mg/dL    Comment: Glucose reference range applies only to samples taken after fasting for at least 8 hours.  Glucose, capillary     Status: Abnormal   Collection Time: 08/06/20  8:38 PM  Result Value Ref Range   Glucose-Capillary 148 (H) 70 - 99 mg/dL    Comment: Glucose reference range applies only to samples taken after fasting for at least 8 hours.  Glucose, capillary     Status: Abnormal   Collection Time: 08/07/20  6:21 AM  Result Value Ref Range   Glucose-Capillary 108 (H) 70 - 99 mg/dL    Comment: Glucose reference range applies only to samples taken after fasting for at least 8 hours.  Glucose, capillary     Status: Abnormal   Collection Time: 08/07/20 11:36 AM  Result Value Ref Range   Glucose-Capillary 163 (H) 70 - 99 mg/dL    Comment: Glucose reference range applies only to samples taken after fasting for at least 8 hours.  Glucose, capillary     Status: Abnormal   Collection Time: 08/07/20  4:36 PM  Result Value Ref Range   Glucose-Capillary 171 (H) 70 - 99 mg/dL    Comment: Glucose reference range applies only to samples taken after fasting for at least 8 hours.  Glucose, capillary     Status: Abnormal   Collection Time: 08/07/20  9:02 PM  Result Value Ref Range   Glucose-Capillary 169 (H) 70 - 99 mg/dL    Comment: Glucose reference range applies only to samples taken after fasting for at least 8 hours.  Glucose, capillary     Status: None   Collection Time: 08/08/20  6:12 AM  Result Value Ref Range   Glucose-Capillary 86 70 - 99 mg/dL     Comment: Glucose reference range applies only to samples taken after fasting for at least 8 hours.  Glucose, capillary     Status: Abnormal   Collection Time: 08/08/20 11:25 AM  Result Value Ref Range   Glucose-Capillary 164 (H) 70 - 99 mg/dL    Comment: Glucose reference range applies only to samples taken after fasting for at least 8 hours.  Glucose, capillary     Status: Abnormal   Collection Time: 08/08/20  9:04 PM  Result Value Ref Range   Glucose-Capillary 127 (H) 70 - 99 mg/dL    Comment: Glucose reference range applies only to samples taken after fasting for at least 8 hours.  Glucose, capillary     Status: None   Collection Time: 08/09/20  6:00 AM  Result Value Ref Range   Glucose-Capillary 83 70 - 99 mg/dL    Comment: Glucose reference range applies only to samples taken after fasting for at least 8 hours.  Glucose, capillary     Status: Abnormal   Collection Time: 08/09/20 11:16  AM  Result Value Ref Range   Glucose-Capillary 198 (H) 70 - 99 mg/dL    Comment: Glucose reference range applies only to samples taken after fasting for at least 8 hours.  Glucose, capillary     Status: Abnormal   Collection Time: 08/09/20  4:43 PM  Result Value Ref Range   Glucose-Capillary 189 (H) 70 - 99 mg/dL    Comment: Glucose reference range applies only to samples taken after fasting for at least 8 hours.  Glucose, capillary     Status: Abnormal   Collection Time: 08/09/20  9:07 PM  Result Value Ref Range   Glucose-Capillary 145 (H) 70 - 99 mg/dL    Comment: Glucose reference range applies only to samples taken after fasting for at least 8 hours.  Glucose, capillary     Status: None   Collection Time: 08/10/20  5:32 AM  Result Value Ref Range   Glucose-Capillary 94 70 - 99 mg/dL    Comment: Glucose reference range applies only to samples taken after fasting for at least 8 hours.  CBC with Differential/Platelet     Status: Abnormal   Collection Time: 08/10/20  8:44 AM  Result Value Ref  Range   WBC 5.5 4.0 - 10.5 K/uL   RBC 3.07 (L) 3.87 - 5.11 MIL/uL   Hemoglobin 9.8 (L) 12.0 - 15.0 g/dL   HCT 31.7 (L) 36.0 - 46.0 %   MCV 103.3 (H) 80.0 - 100.0 fL   MCH 31.9 26.0 - 34.0 pg   MCHC 30.9 30.0 - 36.0 g/dL   RDW 16.5 (H) 11.5 - 15.5 %   Platelets 151 150 - 400 K/uL   nRBC 0.0 0.0 - 0.2 %   Neutrophils Relative % 67 %   Neutro Abs 3.7 1.7 - 7.7 K/uL   Lymphocytes Relative 20 %   Lymphs Abs 1.1 0.7 - 4.0 K/uL   Monocytes Relative 7 %   Monocytes Absolute 0.4 0.1 - 1.0 K/uL   Eosinophils Relative 4 %   Eosinophils Absolute 0.2 0.0 - 0.5 K/uL   Basophils Relative 0 %   Basophils Absolute 0.0 0.0 - 0.1 K/uL   Immature Granulocytes 2 %   Abs Immature Granulocytes 0.11 (H) 0.00 - 0.07 K/uL    Comment: Performed at [facility], [address], [place], [place] [address]  Basic metabolic panel     Status: Abnormal   Collection Time: 08/10/20  8:44 AM  Result Value Ref Range   Sodium 142 135 - 145 mmol/L   Potassium 4.0 3.5 - 5.1 mmol/L   Chloride 100 98 - 111 mmol/L   CO2 32 22 - 32 mmol/L   Glucose, Bld 128 (H) 70 - 99 mg/dL    Comment: Glucose reference range applies only to samples taken after fasting for at least 8 hours.   BUN 26 (H) 8 - 23 mg/dL   Creatinine, Ser 1.17 (H) 0.44 - 1.00 mg/dL   Calcium 9.4 8.9 - 10.3 mg/dL   GFR, Estimated 52 (L) >60 mL/min    Comment: (NOTE) Calculated using the CKD-EPI Creatinine Equation (2021)    Anion gap 10 5 - 15    Comment: Performed at [facility] [address], [place], [place] [address]  Glucose, capillary     Status: Abnormal   Collection Time: 08/10/20 11:58 AM  Result Value Ref Range   Glucose-Capillary 120 (H) 70 - 99 mg/dL    Comment: Glucose reference range applies only to samples  taken after fasting for at least 8 hours.  Glucose, capillary     Status: Abnormal   Collection Time: 08/10/20  4:55 PM  Result Value Ref Range   Glucose-Capillary 161 (H) 70 - 99 mg/dL    Comment: Glucose reference  range applies only to samples taken after fasting for at least 8 hours.  Glucose, capillary     Status: Abnormal   Collection Time: 08/10/20  9:25 PM  Result Value Ref Range   Glucose-Capillary 152 (H) 70 - 99 mg/dL    Comment: Glucose reference range applies only to samples taken after fasting for at least 8 hours.  Glucose, capillary     Status: None   Collection Time: 08/11/20  6:13 AM  Result Value Ref Range   Glucose-Capillary 92 70 - 99 mg/dL    Comment: Glucose reference range applies only to samples taken after fasting for at least 8 hours.  Glucose, capillary     Status: Abnormal   Collection Time: 08/11/20  1:24 PM  Result Value Ref Range   Glucose-Capillary 177 (H) 70 - 99 mg/dL    Comment: Glucose reference range applies only to samples taken after fasting for at least 8 hours.  Glucose, capillary     Status: Abnormal   Collection Time: 08/11/20  5:13 PM  Result Value Ref Range   Glucose-Capillary 139 (H) 70 - 99 mg/dL    Comment: Glucose reference range applies only to samples taken after fasting for at least 8 hours.  Glucose, capillary     Status: Abnormal   Collection Time: 08/11/20  8:56 PM  Result Value Ref Range   Glucose-Capillary 156 (H) 70 - 99 mg/dL    Comment: Glucose reference range applies only to samples taken after fasting for at least 8 hours.  Glucose, capillary     Status: None   Collection Time: 08/12/20  5:41 AM  Result Value Ref Range   Glucose-Capillary 97 70 - 99 mg/dL    Comment: Glucose reference range applies only to samples taken after fasting for at least 8 hours.  Glucose, capillary     Status: Abnormal   Collection Time: 08/12/20  1:01 PM  Result Value Ref Range   Glucose-Capillary 147 (H) 70 - 99 mg/dL    Comment: Glucose reference range applies only to samples taken after fasting for at least 8 hours.  Glucose, capillary     Status: Abnormal   Collection Time: 08/12/20  4:45 PM  Result Value Ref Range   Glucose-Capillary 141 (H) 70 -  99 mg/dL    Comment: Glucose reference range applies only to samples taken after fasting for at least 8 hours.  Glucose, capillary     Status: Abnormal   Collection Time: 08/12/20  9:02 PM  Result Value Ref Range   Glucose-Capillary 144 (H) 70 - 99 mg/dL    Comment: Glucose reference range applies only to samples taken after fasting for at least 8 hours.  Glucose, capillary     Status: None   Collection Time: 08/13/20  6:03 AM  Result Value Ref Range   Glucose-Capillary 86 70 - 99 mg/dL    Comment: Glucose reference range applies only to samples taken after fasting for at least 8 hours.  Glucose, capillary     Status: None   Collection Time: 08/13/20 11:22 AM  Result Value Ref Range   Glucose-Capillary 99 70 - 99 mg/dL    Comment: Glucose reference range applies only to samples taken after fasting  for at least 8 hours.  Glucose, capillary     Status: Abnormal   Collection Time: 08/13/20  5:28 PM  Result Value Ref Range   Glucose-Capillary 154 (H) 70 - 99 mg/dL    Comment: Glucose reference range applies only to samples taken after fasting for at least 8 hours.  Glucose, capillary     Status: Abnormal   Collection Time: 08/13/20  9:01 PM  Result Value Ref Range   Glucose-Capillary 132 (H) 70 - 99 mg/dL    Comment: Glucose reference range applies only to samples taken after fasting for at least 8 hours.  Glucose, capillary     Status: Abnormal   Collection Time: 08/14/20  5:59 AM  Result Value Ref Range   Glucose-Capillary 101 (H) 70 - 99 mg/dL    Comment: Glucose reference range applies only to samples taken after fasting for at least 8 hours.  CULTURE, URINE COMPREHENSIVE     Status: Abnormal   Collection Time: 08/26/20 10:51 AM   Specimen: Urine   UR  Result Value Ref Range   Urine Culture, Comprehensive Final report (A)    Organism ID, Bacteria Hafnia alvei (A)     Comment: 50,000-100,000 colony forming units per mL   ANTIMICROBIAL SUSCEPTIBILITY Comment     Comment:        ** S = Susceptible; I = Intermediate; R = Resistant **                    P = Positive; N = Negative             MICS are expressed in micrograms per mL    Antibiotic                 RSLT#1    RSLT#2    RSLT#3    RSLT#4 Amoxicillin/Clavulanic Acid    R Ampicillin                     R Cefazolin                      R Ceftriaxone                    S Cefuroxime                     S Ciprofloxacin                  S Gentamicin                     S Meropenem                      S Nitrofurantoin                 S Tetracycline                   S Tobramycin                     S   Urinalysis, Complete     Status: Abnormal   Collection Time: 08/26/20 10:51 AM  Result Value Ref Range   Specific Gravity, UA 1.020 1.005 - 1.030   pH, UA 7.0 5.0 - 7.5   Color, UA Yellow Yellow   Appearance Ur Cloudy (A) Clear   Leukocytes,UA 3+ (A) Negative   Protein,UA 2+ (A) Negative/Trace  Glucose, UA Negative Negative   Ketones, UA Negative Negative   RBC, UA 3+ (A) Negative   Bilirubin, UA Negative Negative   Urobilinogen, Ur 0.2 0.2 - 1.0 mg/dL   Nitrite, UA Positive (A) Negative   Microscopic Examination See below:   Microscopic Examination     Status: Abnormal   Collection Time: 08/26/20 10:51 AM   Urine  Result Value Ref Range   WBC, UA >30 (A) 0 - 5 /hpf   RBC >30 (A) 0 - 2 /hpf   Epithelial Cells (non renal) 0-10 0 - 10 /hpf   Bacteria, UA Moderate (A) None seen/Few  Urinalysis, Complete     Status: Abnormal   Collection Time: 09/16/20  9:29 AM  Result Value Ref Range   Specific Gravity, UA 1.025 1.005 - 1.030   pH, UA 6.0 5.0 - 7.5   Color, UA Orange Yellow   Appearance Ur Cloudy (A) Clear   Leukocytes,UA 3+ (A) Negative   Protein,UA 3+ (A) Negative/Trace   Glucose, UA Negative Negative   Ketones, UA Negative Negative   RBC, UA 3+ (A) Negative   Bilirubin, UA Negative Negative   Urobilinogen, Ur 0.2 0.2 - 1.0 mg/dL   Nitrite, UA Negative Negative   Microscopic Examination  See below:   CULTURE, URINE COMPREHENSIVE     Status: Abnormal   Collection Time: 09/16/20  9:29 AM   Specimen: Urine   UR  Result Value Ref Range   Urine Culture, Comprehensive Final report (A)    Organism ID, Bacteria Yeast isolated. (A)     Comment: 50,000-100,000 colony forming units per mL Request for further identification must be made within 1 week.   Microscopic Examination     Status: Abnormal   Collection Time: 09/16/20  9:29 AM   Urine  Result Value Ref Range   WBC, UA >30 (A) 0 - 5 /hpf   RBC >30 (A) 0 - 2 /hpf   Epithelial Cells (non renal) 0-10 0 - 10 /hpf   Bacteria, UA Moderate (A) None seen/Few   Yeast, UA Present (A) None seen  CBC with Differential/Platelet     Status: Abnormal   Collection Time: 09/23/20 10:22 AM  Result Value Ref Range   WBC 16.8 (H) 3.4 - 10.8 x10E3/uL   RBC 3.70 (L) 3.77 - 5.28 x10E6/uL   Hemoglobin 12.3 11.1 - 15.9 g/dL   Hematocrit 35.9 34.0 - 46.6 %   MCV 97 79 - 97 fL   MCH 33.2 (H) 26.6 - 33.0 pg   MCHC 34.3 31.5 - 35.7 g/dL   RDW 15.1 11.7 - 15.4 %   Platelets 366 150 - 450 x10E3/uL   Neutrophils 80 Not Estab. %   Lymphs 9 Not Estab. %   Monocytes 8 Not Estab. %   Eos 2 Not Estab. %   Basos 0 Not Estab. %   Neutrophils Absolute 13.4 (H) 1.4 - 7.0 x10E3/uL   Lymphocytes Absolute 1.5 0.7 - 3.1 x10E3/uL   Monocytes Absolute 1.3 (H) 0.1 - 0.9 x10E3/uL   EOS (ABSOLUTE) 0.3 0.0 - 0.4 x10E3/uL   Basophils Absolute 0.1 0.0 - 0.2 x10E3/uL   Immature Granulocytes 1 Not Estab. %   Immature Grans (Abs) 0.2 (H) 0.0 - 0.1 x10E3/uL    Comment: (An elevated percentage of Immature Granulocytes has not been found to be clinically significant as a sole clinical predictor of disease. Does NOT include bands or blast cells.  Pregnancy associated physiological leukocytosis may also show increased immature  granulocytes without clinical significance.)   Comprehensive metabolic panel     Status: Abnormal   Collection Time: 09/23/20 10:22 AM   Result Value Ref Range   Glucose 133 (H) 65 - 99 mg/dL   BUN 21 8 - 27 mg/dL   Creatinine, Ser 1.04 (H) 0.57 - 1.00 mg/dL   GFR calc non Af Amer 57 (L) >59 mL/min/1.73   GFR calc Af Amer 65 >59 mL/min/1.73    Comment: **In accordance with recommendations from the NKF-ASN Task force,**   [facility] is in the process of updating its eGFR calculation to the   2021 CKD-EPI creatinine equation that estimates kidney function   without a race variable.    BUN/Creatinine Ratio 20 12 - 28   Sodium 144 134 - 144 mmol/L   Potassium 3.3 (L) 3.5 - 5.2 mmol/L   Chloride 99 96 - 106 mmol/L   CO2 28 20 - 29 mmol/L   Calcium 9.7 8.7 - 10.3 mg/dL   Total Protein 6.2 6.0 - 8.5 g/dL   Albumin 3.8 3.8 - 4.8 g/dL   Globulin, Total 2.4 1.5 - 4.5 g/dL   Albumin/Globulin Ratio 1.6 1.2 - 2.2   Bilirubin Total 0.4 0.0 - 1.2 mg/dL   Alkaline Phosphatase 67 44 - 121 IU/L    Comment:               **Please note reference interval change**   AST 18 0 - 40 IU/L   ALT 17 0 - 32 IU/L  Urinalysis, Complete     Status: Abnormal   Collection Time: 09/25/20 10:55 AM  Result Value Ref Range   Specific Gravity, UA 1.025 1.005 - 1.030   pH, UA 6.5 5.0 - 7.5   Color, UA Yellow Yellow   Appearance Ur Cloudy (A) Clear   Leukocytes,UA 3+ (A) Negative   Protein,UA 3+ (A) Negative/Trace   Glucose, UA Negative Negative   Ketones, UA Negative Negative   RBC, UA 3+ (A) Negative   Bilirubin, UA Negative Negative   Urobilinogen, Ur 0.2 0.2 - 1.0 mg/dL   Nitrite, UA Negative Negative   Microscopic Examination See below:   Microscopic Examination     Status: Abnormal   Collection Time: 09/25/20 10:55 AM   Urine  Result Value Ref Range   WBC, UA >30 (H) 0 - 5 /hpf   RBC >30 (H) 0 - 2 /hpf   Epithelial Cells (non renal) None seen 0 - 10 /hpf   Bacteria, UA Many (A) None seen/Few  CULTURE, URINE COMPREHENSIVE     Status: None   Collection Time: 09/25/20 11:19 AM   Specimen: Urine   UR  Result Value Ref Range   Urine  Culture, Comprehensive Final report    Organism ID, Bacteria Comment     Comment: Mixed urogenital flora 10,000-25,000 colony forming units per mL   SARS CORONAVIRUS 2 (TAT 6-24 HRS) Nasopharyngeal Nasopharyngeal Swab     Status: None   Collection Time: 09/30/20  1:33 PM   Specimen: Nasopharyngeal Swab  Result Value Ref Range   SARS Coronavirus 2 NEGATIVE NEGATIVE    Comment: (NOTE) SARS-CoV-2 target nucleic acids are NOT DETECTED.  The SARS-CoV-2 RNA is generally detectable in upper and lower respiratory specimens during the acute phase of infection. Negative results do not preclude SARS-CoV-2 infection, do not rule out co-infections with other pathogens, and should not be used as the sole basis for treatment or other patient management decisions. Negative results must be combined with  clinical observations, patient history, and epidemiological information. The expected result is Negative.  Fact Sheet for Patients: SugarRoll.be  Fact Sheet for Healthcare Providers: https://www.woods-mathews.com/  This test is not yet approved or cleared by the [place] FDA and  has been authorized for detection and/or diagnosis of SARS-CoV-2 by FDA under an Emergency Use Authorization (EUA). This EUA will remain  in effect (meaning this test can be used) for the duration of the COVID-19 declaration under Se ction 564(b)(1) of the Act, 21 U.S.C. section 360bbb-3(b)(1), unless the authorization is terminated or revoked sooner.  Performed at [facility], [employer] [address], [place], [place] [address]   CBC     Status: Abnormal   Collection Time: 09/30/20  1:47 PM  Result Value Ref Range   WBC 20.7 (H) 4.0 - 10.5 K/uL   RBC 3.69 (L) 3.87 - 5.11 MIL/uL   Hemoglobin 12.1 12.0 - 15.0 g/dL   HCT 36.2 36.0 - 46.0 %   MCV 98.1 80.0 - 100.0 fL   MCH 32.8 26.0 - 34.0 pg   MCHC 33.4 30.0 - 36.0 g/dL   RDW 15.0 11.5 - 15.5 %   Platelets 330 150 -  400 K/uL   nRBC 0.0 0.0 - 0.2 %    Comment: Performed at [facility], [address] [place], [place] [address]  APTT     Status: None   Collection Time: 09/30/20  1:47 PM  Result Value Ref Range   aPTT 30 24 - 36 seconds    Comment: Performed at [facility], [address] [place], [place] [address]  Protime-INR     Status: None   Collection Time: 09/30/20  1:47 PM  Result Value Ref Range   Prothrombin Time 13.7 11.4 - 15.2 seconds   INR 1.1 0.8 - 1.2    Comment: (NOTE) INR goal varies based on device and disease states. Performed at [facility], [address] [place],  [address]   Basic metabolic panel     Status: Abnormal   Collection Time: 09/30/20  1:47 PM  Result Value Ref Range   Sodium 139 135 - 145 mmol/L   Potassium 3.9 3.5 - 5.1 mmol/L   Chloride 99 98 - 111 mmol/L   CO2 28 22 - 32 mmol/L   Glucose, Bld 142 (H) 70 - 99 mg/dL    Comment: Glucose reference range applies only to samples taken after fasting for at least 8 hours.   BUN 29 (H) 8 - 23 mg/dL   Creatinine, Ser 1.09 (H) 0.44 - 1.00 mg/dL   Calcium 9.2 8.9 - 10.3 mg/dL   GFR, Estimated 56 (L) >60 mL/min    Comment: (NOTE) Calculated using the CKD-EPI Creatinine Equation (2021)    Anion gap 12 5 - 15    Comment: Performed at [facility], [address] [place],  [address]  Urinalysis, Complete     Status: Abnormal   Collection Time: 10/07/20 10:16 AM  Result Value Ref Range   Specific Gravity, UA 1.020 1.005 - 1.030   pH, UA 6.5 5.0 - 7.5   Color, UA Red (A) Yellow   Appearance Ur Cloudy (A) Clear   Leukocytes,UA 2+ (A) Negative   Protein,UA 3+ (A) Negative/Trace   Glucose, UA Negative Negative   Ketones, UA Negative Negative   RBC, UA 3+ (A) Negative   Bilirubin, UA Negative Negative   Urobilinogen, Ur 0.2 0.2 - 1.0 mg/dL   Nitrite, UA Negative  Negative   Microscopic Examination See below:     Comment: Results from sub-optimal specimen  performed at the client's request. Interpret result(s) with caution. Microscopic exam performed on unspun urine. Reference range(s) not established for this type of specimen.   Microscopic Examination     Status: Abnormal   Collection Time: 10/07/20 10:16 AM   Urine  Result Value Ref Range   WBC, UA 6-10 (A) 0 - 5 /hpf   RBC >30 (A) 0 - 2 /hpf   Epithelial Cells (non renal) 0-10 0 - 10 /hpf   Casts Present (A) None seen /lpf   Cast Type Hyaline casts N/A   Bacteria, UA Few None seen/Few    Radiology No results found.  Assessment/Plan  CKD (chronic kidney disease) stage 3, GFR 30-59 ml/min (HCC) Avoid or limit contrast as possible.  Hyperlipidemia, mixed lipid control important in reducing the progression of atherosclerotic disease. Continue statin therapy   Pulmonary hypertension ([place]) Fairly significant and on supplemental oxygen.  Stable at this time.  Essential hypertension blood pressure control important in reducing the progression of atherosclerotic disease. On appropriate oral medications.   Gangrene of left foot [facility]) Her husband is a physician and is doing a tremendous job of caring for the wounds and debriding as needed.  He is going to do a little more debridement at home and try to clean up the gangrenous tendons on the second and third toes.  These will likely auto amputate once he does that.  She will still likely have exposed metatarsal heads that will eventually need to be resected and to get definitive tissue closure, but she has no pain and waiting on these is perfectly reasonable as we have gained a lot of tissue by being patient over the last 3 months.  As long as she is not showing any signs of infection and she continues to be pain-free, continuing local wound care would be the best option.  I will plan to see her back in about a month to check the wound.    Leotis Pain, MD  11/03/2020 11:21 AM    This note was created with Dragon medical  transcription system.  Any errors from dictation are purely unintentional

## 2020-11-03 NOTE — Assessment & Plan Note (Signed)
Fairly significant and on supplemental oxygen.  Stable at this time.

## 2020-11-03 NOTE — Assessment & Plan Note (Signed)
Avoid or limit contrast as possible.

## 2020-11-03 NOTE — Assessment & Plan Note (Signed)
blood pressure control important in reducing the progression of atherosclerotic disease. On appropriate oral medications.  

## 2020-11-03 NOTE — Assessment & Plan Note (Signed)
Her husband is a physician and is doing a tremendous job of caring for the wounds and debriding as needed.  He is going to do a little more debridement at home and try to clean up the gangrenous tendons on the second and third toes.  These will likely auto amputate once he does that.  She will still likely have exposed metatarsal heads that will eventually need to be resected and to get definitive tissue closure, but she has no pain and waiting on these is perfectly reasonable as we have gained a lot of tissue by being patient over the last 3 months.  As long as she is not showing any signs of infection and she continues to be pain-free, continuing local wound care would be the best option.  I will plan to see her back in about a month to check the wound.

## 2020-11-03 NOTE — Assessment & Plan Note (Signed)
lipid control important in reducing the progression of atherosclerotic disease. Continue statin therapy  

## 2020-11-04 ENCOUNTER — Other Ambulatory Visit: Payer: Self-pay

## 2020-11-04 DIAGNOSIS — J45909 Unspecified asthma, uncomplicated: Secondary | ICD-10-CM | POA: Diagnosis not present

## 2020-11-04 DIAGNOSIS — D62 Acute posthemorrhagic anemia: Secondary | ICD-10-CM | POA: Diagnosis not present

## 2020-11-04 DIAGNOSIS — I1310 Hypertensive heart and chronic kidney disease without heart failure, with stage 1 through stage 4 chronic kidney disease, or unspecified chronic kidney disease: Secondary | ICD-10-CM | POA: Diagnosis not present

## 2020-11-04 DIAGNOSIS — R739 Hyperglycemia, unspecified: Secondary | ICD-10-CM | POA: Diagnosis not present

## 2020-11-04 DIAGNOSIS — I088 Other rheumatic multiple valve diseases: Secondary | ICD-10-CM | POA: Diagnosis not present

## 2020-11-04 DIAGNOSIS — R339 Retention of urine, unspecified: Secondary | ICD-10-CM | POA: Diagnosis not present

## 2020-11-04 DIAGNOSIS — I70223 Atherosclerosis of native arteries of extremities with rest pain, bilateral legs: Secondary | ICD-10-CM | POA: Diagnosis not present

## 2020-11-04 DIAGNOSIS — I272 Pulmonary hypertension, unspecified: Secondary | ICD-10-CM | POA: Diagnosis not present

## 2020-11-04 DIAGNOSIS — D860 Sarcoidosis of lung: Secondary | ICD-10-CM | POA: Diagnosis not present

## 2020-11-04 DIAGNOSIS — Z4800 Encounter for change or removal of nonsurgical wound dressing: Secondary | ICD-10-CM | POA: Diagnosis not present

## 2020-11-04 DIAGNOSIS — D696 Thrombocytopenia, unspecified: Secondary | ICD-10-CM | POA: Diagnosis not present

## 2020-11-04 DIAGNOSIS — N179 Acute kidney failure, unspecified: Secondary | ICD-10-CM | POA: Diagnosis not present

## 2020-11-04 DIAGNOSIS — H9190 Unspecified hearing loss, unspecified ear: Secondary | ICD-10-CM | POA: Diagnosis not present

## 2020-11-04 DIAGNOSIS — T380X5D Adverse effect of glucocorticoids and synthetic analogues, subsequent encounter: Secondary | ICD-10-CM | POA: Diagnosis not present

## 2020-11-04 DIAGNOSIS — N189 Chronic kidney disease, unspecified: Secondary | ICD-10-CM | POA: Diagnosis not present

## 2020-11-04 DIAGNOSIS — E039 Hypothyroidism, unspecified: Secondary | ICD-10-CM | POA: Diagnosis not present

## 2020-11-04 MED ORDER — ESCITALOPRAM OXALATE 20 MG PO TABS: 20.0000 mg | ORAL_TABLET | Freq: Every day | ORAL | 0 refills | Status: DC

## 2020-11-09 ENCOUNTER — Encounter: Payer: Self-pay | Admitting: *Deleted

## 2020-11-09 ENCOUNTER — Other Ambulatory Visit: Payer: Self-pay

## 2020-11-09 ENCOUNTER — Ambulatory Visit
Admission: RE | Admit: 2020-11-09 | Discharge: 2020-11-09 | Disposition: A | Discharge status: Home / Self Care | Payer: BC Managed Care – PPO | Source: Ambulatory Visit | Attending: Urology | Admitting: Urology

## 2020-11-09 DIAGNOSIS — N200 Calculus of kidney: Secondary | ICD-10-CM | POA: Insufficient documentation

## 2020-11-09 DIAGNOSIS — N1330 Unspecified hydronephrosis: Secondary | ICD-10-CM | POA: Diagnosis not present

## 2020-11-11 ENCOUNTER — Other Ambulatory Visit: Payer: Self-pay

## 2020-11-11 ENCOUNTER — Other Ambulatory Visit: Payer: BC Managed Care – PPO

## 2020-11-11 DIAGNOSIS — N200 Calculus of kidney: Secondary | ICD-10-CM | POA: Diagnosis not present

## 2020-11-13 ENCOUNTER — Telehealth: Payer: Self-pay

## 2020-11-13 ENCOUNTER — Telehealth: Payer: Self-pay | Admitting: Urology

## 2020-11-13 DIAGNOSIS — R739 Hyperglycemia, unspecified: Secondary | ICD-10-CM | POA: Diagnosis not present

## 2020-11-13 DIAGNOSIS — N179 Acute kidney failure, unspecified: Secondary | ICD-10-CM | POA: Diagnosis not present

## 2020-11-13 DIAGNOSIS — R339 Retention of urine, unspecified: Secondary | ICD-10-CM | POA: Diagnosis not present

## 2020-11-13 DIAGNOSIS — E039 Hypothyroidism, unspecified: Secondary | ICD-10-CM | POA: Diagnosis not present

## 2020-11-13 DIAGNOSIS — I272 Pulmonary hypertension, unspecified: Secondary | ICD-10-CM | POA: Diagnosis not present

## 2020-11-13 DIAGNOSIS — D62 Acute posthemorrhagic anemia: Secondary | ICD-10-CM | POA: Diagnosis not present

## 2020-11-13 DIAGNOSIS — N1330 Unspecified hydronephrosis: Secondary | ICD-10-CM

## 2020-11-13 DIAGNOSIS — I70223 Atherosclerosis of native arteries of extremities with rest pain, bilateral legs: Secondary | ICD-10-CM | POA: Diagnosis not present

## 2020-11-13 DIAGNOSIS — T380X5D Adverse effect of glucocorticoids and synthetic analogues, subsequent encounter: Secondary | ICD-10-CM | POA: Diagnosis not present

## 2020-11-13 DIAGNOSIS — N189 Chronic kidney disease, unspecified: Secondary | ICD-10-CM | POA: Diagnosis not present

## 2020-11-13 DIAGNOSIS — Z4800 Encounter for change or removal of nonsurgical wound dressing: Secondary | ICD-10-CM | POA: Diagnosis not present

## 2020-11-13 DIAGNOSIS — H9190 Unspecified hearing loss, unspecified ear: Secondary | ICD-10-CM | POA: Diagnosis not present

## 2020-11-13 DIAGNOSIS — J45909 Unspecified asthma, uncomplicated: Secondary | ICD-10-CM | POA: Diagnosis not present

## 2020-11-13 DIAGNOSIS — D696 Thrombocytopenia, unspecified: Secondary | ICD-10-CM | POA: Diagnosis not present

## 2020-11-13 DIAGNOSIS — I1310 Hypertensive heart and chronic kidney disease without heart failure, with stage 1 through stage 4 chronic kidney disease, or unspecified chronic kidney disease: Secondary | ICD-10-CM | POA: Diagnosis not present

## 2020-11-13 DIAGNOSIS — D860 Sarcoidosis of lung: Secondary | ICD-10-CM | POA: Diagnosis not present

## 2020-11-13 DIAGNOSIS — I088 Other rheumatic multiple valve diseases: Secondary | ICD-10-CM | POA: Diagnosis not present

## 2020-11-13 NOTE — Telephone Encounter (Signed)
Renal scan ordered.

## 2020-11-13 NOTE — Telephone Encounter (Signed)
Renal ultrasound shows mild bilateral hydronephrosis.  I called her this morning and she is asymptomatic and feeling well.  She specifically denies any flank pain, abdominal pain, groin pain, gross hematuria, dysuria, or urinary symptoms.  She is not having any incontinence or difficulty voiding.  I suspect the mild bilateral hydronephrosis may be related to her having the indwelling stents for 3 months prior to definitive stone removal.  I ordered a repeat renal ultrasound for 2 weeks prior to our upcoming clinic visit.  Need to consider CT if any worsening of hydronephrosis or unilateral, or new symptoms.  Nickolas Madrid, MD 11/13/2020

## 2020-11-13 NOTE — Telephone Encounter (Signed)
-----  Message from Billey Co, MD sent at 11/13/2020  9:39 AM EST ----- I reviewed her results with her.  She is asymptomatic and doing very well.  Can you order a repeat renal ultrasound to be completed 1 to 2 days prior to her upcoming visit on 2/9, thanks  Nickolas Madrid, MD 11/13/2020

## 2020-11-14 DIAGNOSIS — I6340 Cerebral infarction due to embolism of unspecified cerebral artery: Secondary | ICD-10-CM | POA: Diagnosis not present

## 2020-11-18 ENCOUNTER — Telehealth (INDEPENDENT_AMBULATORY_CARE_PROVIDER_SITE_OTHER): Payer: Self-pay

## 2020-11-18 DIAGNOSIS — D860 Sarcoidosis of lung: Secondary | ICD-10-CM | POA: Diagnosis not present

## 2020-11-18 DIAGNOSIS — E039 Hypothyroidism, unspecified: Secondary | ICD-10-CM | POA: Diagnosis not present

## 2020-11-18 DIAGNOSIS — Z4800 Encounter for change or removal of nonsurgical wound dressing: Secondary | ICD-10-CM | POA: Diagnosis not present

## 2020-11-18 DIAGNOSIS — N189 Chronic kidney disease, unspecified: Secondary | ICD-10-CM | POA: Diagnosis not present

## 2020-11-18 DIAGNOSIS — R339 Retention of urine, unspecified: Secondary | ICD-10-CM | POA: Diagnosis not present

## 2020-11-18 DIAGNOSIS — I70223 Atherosclerosis of native arteries of extremities with rest pain, bilateral legs: Secondary | ICD-10-CM | POA: Diagnosis not present

## 2020-11-18 DIAGNOSIS — T380X5D Adverse effect of glucocorticoids and synthetic analogues, subsequent encounter: Secondary | ICD-10-CM | POA: Diagnosis not present

## 2020-11-18 DIAGNOSIS — I1310 Hypertensive heart and chronic kidney disease without heart failure, with stage 1 through stage 4 chronic kidney disease, or unspecified chronic kidney disease: Secondary | ICD-10-CM | POA: Diagnosis not present

## 2020-11-18 DIAGNOSIS — D62 Acute posthemorrhagic anemia: Secondary | ICD-10-CM | POA: Diagnosis not present

## 2020-11-18 DIAGNOSIS — D696 Thrombocytopenia, unspecified: Secondary | ICD-10-CM | POA: Diagnosis not present

## 2020-11-18 DIAGNOSIS — I272 Pulmonary hypertension, unspecified: Secondary | ICD-10-CM | POA: Diagnosis not present

## 2020-11-18 DIAGNOSIS — H9190 Unspecified hearing loss, unspecified ear: Secondary | ICD-10-CM | POA: Diagnosis not present

## 2020-11-18 DIAGNOSIS — N179 Acute kidney failure, unspecified: Secondary | ICD-10-CM | POA: Diagnosis not present

## 2020-11-18 DIAGNOSIS — I088 Other rheumatic multiple valve diseases: Secondary | ICD-10-CM | POA: Diagnosis not present

## 2020-11-18 DIAGNOSIS — R739 Hyperglycemia, unspecified: Secondary | ICD-10-CM | POA: Diagnosis not present

## 2020-11-18 DIAGNOSIS — J45909 Unspecified asthma, uncomplicated: Secondary | ICD-10-CM | POA: Diagnosis not present

## 2020-11-18 NOTE — Telephone Encounter (Signed)
Ok, we will assess at follow up in a week or so

## 2020-11-18 NOTE — Telephone Encounter (Signed)
Nurse Cecille Rubin from [place] called and and left a VM on the nurses line wanting to make [place] aware that the pt loss 2 toes on her Lt foot that were covered in eschar this happened last week. The pt's husband help take the rest of the toes off of the foot per the nurse Cecille Rubin.

## 2020-11-18 NOTE — Telephone Encounter (Signed)
I called and made the RN [facility] aware.

## 2020-11-19 ENCOUNTER — Other Ambulatory Visit: Payer: Self-pay | Admitting: Urology

## 2020-11-20 ENCOUNTER — Ambulatory Visit
Admission: RE | Admit: 2020-11-20 | Discharge: 2020-11-20 | Disposition: A | Discharge status: Home / Self Care | Payer: BC Managed Care – PPO | Source: Ambulatory Visit | Attending: Urology | Admitting: Urology

## 2020-11-20 ENCOUNTER — Other Ambulatory Visit: Payer: Self-pay

## 2020-11-20 DIAGNOSIS — N1330 Unspecified hydronephrosis: Secondary | ICD-10-CM | POA: Insufficient documentation

## 2020-11-24 DIAGNOSIS — D696 Thrombocytopenia, unspecified: Secondary | ICD-10-CM | POA: Diagnosis not present

## 2020-11-24 DIAGNOSIS — R739 Hyperglycemia, unspecified: Secondary | ICD-10-CM | POA: Diagnosis not present

## 2020-11-24 DIAGNOSIS — N179 Acute kidney failure, unspecified: Secondary | ICD-10-CM | POA: Diagnosis not present

## 2020-11-24 DIAGNOSIS — I088 Other rheumatic multiple valve diseases: Secondary | ICD-10-CM | POA: Diagnosis not present

## 2020-11-24 DIAGNOSIS — I70223 Atherosclerosis of native arteries of extremities with rest pain, bilateral legs: Secondary | ICD-10-CM | POA: Diagnosis not present

## 2020-11-24 DIAGNOSIS — R339 Retention of urine, unspecified: Secondary | ICD-10-CM | POA: Diagnosis not present

## 2020-11-24 DIAGNOSIS — I1310 Hypertensive heart and chronic kidney disease without heart failure, with stage 1 through stage 4 chronic kidney disease, or unspecified chronic kidney disease: Secondary | ICD-10-CM | POA: Diagnosis not present

## 2020-11-24 DIAGNOSIS — H9190 Unspecified hearing loss, unspecified ear: Secondary | ICD-10-CM | POA: Diagnosis not present

## 2020-11-24 DIAGNOSIS — E039 Hypothyroidism, unspecified: Secondary | ICD-10-CM | POA: Diagnosis not present

## 2020-11-24 DIAGNOSIS — Z4800 Encounter for change or removal of nonsurgical wound dressing: Secondary | ICD-10-CM | POA: Diagnosis not present

## 2020-11-24 DIAGNOSIS — T380X5D Adverse effect of glucocorticoids and synthetic analogues, subsequent encounter: Secondary | ICD-10-CM | POA: Diagnosis not present

## 2020-11-24 DIAGNOSIS — D62 Acute posthemorrhagic anemia: Secondary | ICD-10-CM | POA: Diagnosis not present

## 2020-11-24 DIAGNOSIS — J45909 Unspecified asthma, uncomplicated: Secondary | ICD-10-CM | POA: Diagnosis not present

## 2020-11-24 DIAGNOSIS — D860 Sarcoidosis of lung: Secondary | ICD-10-CM | POA: Diagnosis not present

## 2020-11-24 DIAGNOSIS — N189 Chronic kidney disease, unspecified: Secondary | ICD-10-CM | POA: Diagnosis not present

## 2020-11-24 DIAGNOSIS — G4733 Obstructive sleep apnea (adult) (pediatric): Secondary | ICD-10-CM | POA: Diagnosis not present

## 2020-11-24 DIAGNOSIS — I272 Pulmonary hypertension, unspecified: Secondary | ICD-10-CM | POA: Diagnosis not present

## 2020-11-25 ENCOUNTER — Ambulatory Visit (INDEPENDENT_AMBULATORY_CARE_PROVIDER_SITE_OTHER): Payer: BC Managed Care – PPO | Admitting: Urology

## 2020-11-25 ENCOUNTER — Encounter: Payer: Self-pay | Admitting: Urology

## 2020-11-25 ENCOUNTER — Other Ambulatory Visit: Payer: Self-pay

## 2020-11-25 VITALS — BP 126/85 | HR 111 | Ht 67.0 in | Wt 170.0 lb

## 2020-11-25 DIAGNOSIS — N200 Calculus of kidney: Secondary | ICD-10-CM | POA: Diagnosis not present

## 2020-11-25 MED ORDER — POTASSIUM CITRATE ER 10 MEQ (1080 MG) PO TBCR: 20.0000 meq | EXTENDED_RELEASE_TABLET | Freq: Three times a day (TID) | ORAL | 11 refills | Status: DC

## 2020-11-25 NOTE — Patient Instructions (Addendum)
Continue to drink plenty of fluids, with goal of 2.5 L/day output.  Clear urine is a good goal.  Increase citrate in the diet with lemon juice or Crystal light lemonade.  Avoid high oxalate foods like spinach and nuts.  We are adding potassium citrate as a medication to increase the citrate and help prevent stones.  We will call with renal ultrasound results in 6 weeks, and discussed 24-hour urine results on repeat test in 4 months to monitor response to the potassium citrate medication.  [facility] Instructions [facility]  You will receive a box/kit in the mail that will have a urine jug and instructions in the kit.  When the box arrives you will need to call our office [phone] to schedule a LAB appointment.  You will need to do a 24hour urine and this should be done during the days that our office will be open.  For example any day from Sunday through Thursday.  If you take Vitamin C 146m or greater please stop this 5 days prior to collection.  How to collect the urine sample: On the day you start the urine sample this 1st morning urine should NOT be collected.  For the rest of the day including all night urines should be collected.  On the next morning the 1st urine should be collected and then you will be finished with the urine collections.  You will need to bring the box with you on your LAB appointment day after urine has been collected and all instructions are complete in the box.  Your blood will be drawn and the box will be collected by our Lab employee to be sent off for analysis.  When urine and blood is complete you will need to schedule a follow up appointment for lab results.           Textbook of Natural Medicine (5th ed., pp. [phone]. [place], [place]: Elsevier.">  Dietary Guidelines to Help Prevent Kidney Stones Kidney stones are deposits of minerals and salts that form inside your kidneys. Your risk of developing kidney stones may be  greater depending on your diet, your lifestyle, the medicines you take, and whether you have certain medical conditions. Most people can lower their chances of developing kidney stones by following the instructions below. Your dietitian may give you more specific instructions depending on your overall health and the type of kidney stones you tend to develop. What are tips for following this plan? Reading food labels  Choose foods with "no salt added" or "low-salt" labels. Limit your salt (sodium) intake to less than 1,500 mg a day.  Choose foods with calcium for each meal and snack. Try to eat about 300 mg of calcium at each meal. Foods that contain 200-500 mg of calcium a serving include: ? 8 oz (237 mL) of milk, calcium-fortifiednon-dairy milk, and calcium-fortifiedfruit juice. Calcium-fortified means that calcium has been added to these drinks. ? 8 oz (237 mL) of kefir, yogurt, and soy yogurt. ? 4 oz (114 g) of tofu. ? 1 oz (28 g) of cheese. ? 1 cup (150 g) of dried figs. ? 1 cup (91 g) of cooked broccoli. ? One 3 oz (85 g) can of sardines or mackerel. Most people need 1,000-1,500 mg of calcium a day. Talk to your dietitian about how much calcium is recommended for you.   Shopping  Buy plenty of fresh fruits and vegetables. Most people do not need to avoid fruits and vegetables, even if these foods  contain nutrients that may contribute to kidney stones.  When shopping for convenience foods, choose: ? Whole pieces of fruit. ? Pre-made salads with dressing on the side. ? Low-fat fruit and yogurt smoothies.  Avoid buying frozen meals or prepared deli foods. These can be high in sodium.  Look for foods with live cultures, such as yogurt and kefir.  Choose high-fiber grains, such as whole-wheat breads, oat bran, and wheat cereals. Cooking  Do not add salt to food when cooking. Place a salt shaker on the table and allow each person to add his or her own salt to taste.  Use vegetable  protein, such as beans, textured vegetable protein (TVP), or tofu, instead of meat in pasta, casseroles, and soups. Meal planning  Eat less salt, if told by your dietitian. To do this: ? Avoid eating processed or pre-made food. ? Avoid eating fast food.  Eat less animal protein, including cheese, meat, poultry, or fish, if told by your dietitian. To do this: ? Limit the number of times you have meat, poultry, fish, or cheese each week. Eat a diet free of meat at least 2 days a week. ? Eat only one serving each day of meat, poultry, fish, or seafood. ? When you prepare animal protein, cut pieces into small portion sizes. For most meat and fish, one serving is about the size of the palm of your hand.  Eat at least five servings of fresh fruits and vegetables each day. To do this: ? Keep fruits and vegetables on hand for snacks. ? Eat one piece of fruit or a handful of berries with breakfast. ? Have a salad and fruit at lunch. ? Have two kinds of vegetables at dinner.  Limit foods that are high in a substance called oxalate. These include: ? Spinach (cooked), rhubarb, beets, sweet potatoes, and Swiss chard. ? Peanuts. ? Potato chips, french fries, and baked potatoes with skin on. ? Nuts and nut products. ? Chocolate.  If you regularly take a diuretic medicine, make sure to eat at least 1 or 2 servings of fruits or vegetables that are high in potassium each day. These include: ? Avocado. ? Banana. ? Orange, prune, carrot, or tomato juice. ? Baked potato. ? Cabbage. ? Beans and split peas. Lifestyle  Drink enough fluid to keep your urine pale yellow. This is the most important thing you can do. Spread your fluid intake throughout the day.  If you drink alcohol: ? Limit how much you use to:  0-1 drink a day for women who are not pregnant.  0-2 drinks a day for men. ? Be aware of how much alcohol is in your drink. In the [place] one drink equals one 12 oz bottle of beer (355 mL), one  5 oz glass of wine (148 mL), or one 1 oz glass of hard liquor (44 mL).  Lose weight if told by your health care provider. Work with your dietitian to find an eating plan and weight loss strategies that work best for you.   General information  Talk to your health care provider and dietitian about taking daily supplements. You may be told the following depending on your health and the cause of your kidney stones: ? Not to take supplements with vitamin C. ? To take a calcium supplement. ? To take a daily probiotic supplement. ? To take other supplements such as magnesium, fish oil, or vitamin B6.  Take over-the-counter and prescription medicines only as told by your health care provider.  These include supplements. What foods should I limit? Limit your intake of the following foods, or eat them as told by your dietitian. Vegetables Spinach. Rhubarb. Beets. Canned vegetables. Angie Fava. Olives. Baked potatoes with skin. Grains Wheat bran. Baked goods. Salted crackers. Cereals high in sugar. Meats and other proteins Nuts. Nut butters. Large portions of meat, poultry, or fish. Salted, precooked, or cured meats, such as sausages, meat loaves, and hot dogs. Dairy Cheese. Beverages Regular soft drinks. Regular vegetable juice. Seasonings and condiments Seasoning blends with salt. Salad dressings. Soy sauce. Ketchup. Barbecue sauce. Other foods Canned soups. Canned pasta sauce. Casseroles. Pizza. Lasagna. Frozen meals. Potato chips. [place] fries. The items listed above may not be a complete list of foods and beverages you should limit. Contact a dietitian for more information. What foods should I avoid? Talk to your dietitian about specific foods you should avoid based on the type of kidney stones you have and your overall health. Fruits Grapefruit. The item listed above may not be a complete list of foods and beverages you should avoid. Contact a dietitian for more  information. Summary  Kidney stones are deposits of minerals and salts that form inside your kidneys.  You can lower your risk of kidney stones by making changes to your diet.  The most important thing you can do is drink enough fluid. Drink enough fluid to keep your urine pale yellow.  Talk to your dietitian about how much calcium you should have each day, and eat less salt and animal protein as told by your dietitian. This information is not intended to replace advice given to you by your health care provider. Make sure you discuss any questions you have with your health care provider. Document Revised: 09/26/2019 Document Reviewed: 09/26/2019 Elsevier Patient Education  2021 [employer].

## 2020-11-25 NOTE — Progress Notes (Signed)
   11/25/2020 4:11 PM   Stacie [name] Oct 26, 1955 [id number]  Reason for visit: Follow up nephrolithiasis, review 24-hour urine results  HPI: I saw [name] back in clinic today for follow-up of the above issues.  She is a 65 year old female with her history of recurrent calcium oxalate stones, as well as bilateral ureteral stones and sepsis from urinary source with prolonged hospitalization requiring emergent bilateral ureteral stent placement on 07/14/2020.  She underwent definitive bilateral ureteroscopy on 10/02/2020 with removal of all stones.  She has been doing well since that time and denies any flank pain or urinary symptoms.  Original postop renal ultrasound on 11/09/2020 showed mild bilateral hydronephrosis, and a repeat ultrasound on 11/20/2020 showed resolution of the right-sided mild hydronephrosis, but persistent mild left-sided hydronephrosis.  I personally reviewed and interpreted both ultrasounds, and agree with these findings.  There are no stones seen.  She also completed a 24-hour urine that was notable for low urine volume of 1.87 L normal urine calcium of 67, mildly elevated urine oxalate of 42, very low urine citrate of 102, urine pH 6.7, urine sodium of 146, and supersaturation of calcium phosphate of 0.73.  We reviewed these results at length.  I recommended increasing fluid intake with goal of 2.5 L/day, adding potassium citrate 20 mEq 3 times daily for her significant hypocitraturia and recurrent calcium oxalate stones, decreasing high oxalate foods and salt in the diet.  -Repeat renal ultrasound in 6 weeks to confirm complete resolution of mild left hydronephrosis, call with results -Potassium citrate 20 mEq 3 times daily for stone prevention -RTC 4 months with repeat 24-hour urine  Billey Co, MD  [facility] [address] [place], [place] [address] [phone]

## 2020-12-01 ENCOUNTER — Other Ambulatory Visit: Payer: Self-pay | Admitting: Urology

## 2020-12-01 ENCOUNTER — Other Ambulatory Visit: Payer: Self-pay | Admitting: Internal Medicine

## 2020-12-01 ENCOUNTER — Encounter (INDEPENDENT_AMBULATORY_CARE_PROVIDER_SITE_OTHER): Payer: Self-pay | Admitting: Vascular Surgery

## 2020-12-01 ENCOUNTER — Ambulatory Visit (INDEPENDENT_AMBULATORY_CARE_PROVIDER_SITE_OTHER): Payer: BC Managed Care – PPO | Admitting: Vascular Surgery

## 2020-12-01 ENCOUNTER — Other Ambulatory Visit: Payer: Self-pay

## 2020-12-01 VITALS — BP 129/73 | HR 98 | Resp 16

## 2020-12-01 DIAGNOSIS — E782 Mixed hyperlipidemia: Secondary | ICD-10-CM | POA: Diagnosis not present

## 2020-12-01 DIAGNOSIS — I96 Gangrene, not elsewhere classified: Secondary | ICD-10-CM | POA: Diagnosis not present

## 2020-12-01 DIAGNOSIS — I70223 Atherosclerosis of native arteries of extremities with rest pain, bilateral legs: Secondary | ICD-10-CM

## 2020-12-01 DIAGNOSIS — N183 Chronic kidney disease, stage 3 unspecified: Secondary | ICD-10-CM | POA: Diagnosis not present

## 2020-12-01 DIAGNOSIS — I10 Essential (primary) hypertension: Secondary | ICD-10-CM

## 2020-12-01 DIAGNOSIS — I272 Pulmonary hypertension, unspecified: Secondary | ICD-10-CM

## 2020-12-01 DIAGNOSIS — I2721 Secondary pulmonary arterial hypertension: Secondary | ICD-10-CM | POA: Diagnosis not present

## 2020-12-01 DIAGNOSIS — M359 Systemic involvement of connective tissue, unspecified: Secondary | ICD-10-CM | POA: Diagnosis not present

## 2020-12-01 MED ORDER — AMOXICILLIN-POT CLAVULANATE 875-125 MG PO TABS: 1.0000 | ORAL_TABLET | Freq: Two times a day (BID) | ORAL | 0 refills | Status: DC

## 2020-12-01 NOTE — Telephone Encounter (Signed)
   Notes to clinic:  requesting a 90 day supply   Requested Prescriptions  Pending Prescriptions Disp Refills   escitalopram (LEXAPRO) 20 MG tablet [Pharmacy Med Name: ESCITALOPRAM 20 MG TABLET] 90 tablet 1    Sig: TAKE 1 TABLET BY MOUTH EVERY DAY      Psychiatry:  Antidepressants - SSRI Passed - 12/01/2020 10:31 AM      Passed - Completed PHQ-2 or PHQ-9 in the last 360 days      Passed - Valid encounter within last 6 months    Recent Outpatient Visits           2 months ago Cerebrovascular accident (CVA) due to embolism of cerebral artery [facility])   [facility] Glean Hess, MD   10 months ago Acute cystitis without hematuria   [facility] Glean Hess, MD   1 year ago Acute cystitis with hematuria   [facility] Glean Hess, MD   1 year ago Annual physical exam   [facility] Glean Hess, MD   1 year ago Essential hypertension   [place], [place]       Future Appointments             In 6 months Luana Shu [facility]

## 2020-12-01 NOTE — Progress Notes (Signed)
MRN : [id number]  Stacie [name] is a 65 y.o. (15-Dec-1955) female who presents with chief complaint of  Chief Complaint  Patient presents with  . Follow-up    1 month follow up  .  History of Present Illness: Patient returns today in follow up of her foot wounds.  She has developed a little bit of pain and erythema at the base of the left great toe over the past couple of days.  No fevers or chills.  Otherwise, the wound continues to shrink and granulate in well.  The second metatarsal head is exposed and will eventually need to be resected.  The tip of the right great toe remains gangrenous and this appears to be in the process of auto amputating.  The majority of the left great toe is also gangrenous but dry.  Her husband continues to take incredibly good care of the wound.  The second and third toes have come off since her last visit.  Current Outpatient Medications  Medication Sig Dispense Refill  . acetaminophen (TYLENOL) 325 MG tablet Take 2 tablets (650 mg total) by mouth every 4 (four) hours as needed for mild pain or fever.    Marland Kitchen aspirin EC 81 MG EC tablet Take 1 tablet (81 mg total) by mouth daily. Swallow whole. 30 tablet 11  . atorvastatin (LIPITOR) 40 MG tablet Take 1 tablet (40 mg total) by mouth daily. 90 tablet 3  . budesonide-formoterol (SYMBICORT) 80-4.5 MCG/ACT inhaler Take 2 puffs first thing in am and then another 2 puffs about 12 hours later. (Patient taking differently: Inhale 2 puffs into the lungs in the morning and at bedtime.) 6.9 g 1  . denosumab (PROLIA) 60 MG/ML SOSY injection Inject 60 mg into the skin every 6 (six) months.    . dorzolamide (TRUSOPT) 2 % ophthalmic solution Place 1 drop into both eyes 2 (two) times daily.    Marland Kitchen escitalopram (LEXAPRO) 20 MG tablet Take 1 tablet (20 mg total) by mouth daily. 30 tablet 0  . folic acid (FOLVITE) 1 MG tablet Take 1 tablet (1 mg total) by mouth daily. 30 tablet 11  . latanoprost (XALATAN) 0.005 % ophthalmic solution  Place 1 drop into the right eye at bedtime.    Marland Kitchen levothyroxine (SYNTHROID) 137 MCG tablet TAKE 1 TABLET (137 MCG TOTAL) BY MOUTH DAILY BEFORE BREAKFAST. (Patient taking differently: Take 137 mcg by mouth daily before breakfast.) 90 tablet 0  . methotrexate (RHEUMATREX) 2.5 MG tablet Take by mouth.    . methotrexate (RHEUMATREX) 5 MG tablet Take 5 tablets (25 mg total) by mouth once a week. Caution:Chemotherapy. Protect from light. 4 tablet 0  . Multiple Vitamins-Minerals (MULTIVITAMIN WITH MINERALS) tablet Take 1 tablet by mouth daily.    . NON FORMULARY CPAP nightly    . OXYGEN Inhale 2 L into the lungs daily as needed (During walking and activity).     . potassium citrate (UROCIT-K) 10 MEQ (1080 MG) SR tablet Take 2 tablets (20 mEq total) by mouth 3 (three) times daily with meals. 180 tablet 11  . predniSONE (DELTASONE) 10 MG tablet Take 10 mg by mouth daily.    . predniSONE (DELTASONE) 5 MG tablet Take 3 tablets (15 mg total) by mouth daily with breakfast. 60 tablet 0  . Treprostinil (TYVASO) 0.6 MG/ML SOLN Inhale 0.6 mcg into the lungs See admin instructions. 12 breaths 4 times a day     No current facility-administered medications for this visit.    Past  Medical History:  Diagnosis Date  . Acute respiratory failure with hypoxia ([place])   . Arrhythmia    patient unaware if this is current  . Asthma   . Chronic kidney disease   . Depression   . GERD (gastroesophageal reflux disease)   . Heart murmur   . History of kidney stones   . HOH (hard of hearing)    wear aids  . Hyperthyroidism   . Hypothyroidism   . IBS (irritable bowel syndrome)   . Pneumonia   . Pulmonary hypertension ([place])   . Sarcoid   . Sarcoidosis   . Seasonal allergies   . Sleep apnea CPAP with O2  . Stroke ([place]) 07/2020   [place]  . Wears hearing aid in both ears     Past Surgical History:  Procedure Laterality Date  . ABDOMINAL HYSTERECTOMY     partial  . CARDIAC CATHETERIZATION  10/18/2018   [facility]   . CATARACT EXTRACTION W/PHACO Left 07/31/2019   Procedure: CATARACT EXTRACTION PHACO AND INTRAOCULAR LENS PLACEMENT (IOC) LEFT 00:51.1  17.9%  9.15;  Surgeon: Leandrew Koyanagi, MD;  Location: [facility];  Service: Ophthalmology;  Laterality: Left;  keep this patient second  . COLON SURGERY     "colon was fused to bladder - operated on both"  . COLONOSCOPY  09/18/2007   diverticuli, no polyps  . COLONOSCOPY  05/26/2010   diverticuli, no polyps  . CYSTOSCOPY WITH STENT PLACEMENT Bilateral 07/14/2020   Procedure: CYSTOSCOPY WITH STENT PLACEMENT, RETROPYLOGRAM;  Surgeon: Billey Co, MD;  Location: [facility];  Service: Urology;  Laterality: Bilateral;  . CYSTOSCOPY/URETEROSCOPY/HOLMIUM LASER/STENT PLACEMENT Left 02/20/2020   Procedure: CYSTOSCOPY/URETEROSCOPY/LITHOTRIPSY /STENT PLACEMENT;  Surgeon: Hollice Espy, MD;  Location: [facility];  Service: Urology;  Laterality: Left;  . CYSTOSCOPY/URETEROSCOPY/HOLMIUM LASER/STENT PLACEMENT Bilateral 10/02/2020   Procedure: CYSTOSCOPY/URETEROSCOPY/HOLMIUM LASER/STENT PLACEMENT;  Surgeon: Billey Co, MD;  Location: [facility];  Service: Urology;  Laterality: Bilateral;  . EYE SURGERY    . PARS PLANA VITRECTOMY Right 05/20/2015   Procedure: PARS PLANA VITRECTOMY WITH 25 GAUGE, laser;  Surgeon: Milus Height, MD;  Location: [facility];  Service: Ophthalmology;  Laterality: Right;  . PARTIAL HYSTERECTOMY  1990  . TEE WITHOUT CARDIOVERSION N/A 07/27/2020   Procedure: TRANSESOPHAGEAL ECHOCARDIOGRAM (TEE);  Surgeon: Teodoro Spray, MD;  Location: [facility];  Service: Cardiovascular;  Laterality: N/A;  . TUBAL LIGATION    . WISDOM TOOTH EXTRACTION       Social History   Tobacco Use  . Smoking status: Never Smoker  . Smokeless tobacco: Never Used  Vaping Use  . Vaping Use: Never used  Substance Use Topics  . Alcohol use: No  . Drug use: No       Family History  Problem Relation Age of Onset  . Allergies Father   . Asthma  Father   . Colon cancer Father   . Allergies Brother   . Asthma Brother   . Breast cancer Maternal Grandmother      Allergies  Allergen Reactions  . Nitrofurantoin Nausea Only  . Tramadol     Nausea and vomiting  . Sulfa Antibiotics Rash    As an infant    REVIEW OF SYSTEMS (Negative unless checked)  Constitutional: _0 ?Weight loss  _1 ?Fever  _2 ?Chills Cardiac: _3 ?Chest pain   _4 ?Chest pressure   _5 ?Palpitations   _6 ?Shortness of breath when laying flat   _7 ?Shortness of breath at rest   _8 ?Shortness of breath with exertion. Vascular:  _9 ?Pain in legs with  walking   _0 ?Pain in legs at rest   _1 ?Pain in legs when laying flat   _2 ?Claudication   _3 ?Pain in feet when walking  _4 ?Pain in feet at rest  _5 ?Pain in feet when laying flat   _6 ?History of DVT   _7 ?Phlebitis   _8 ?Swelling in legs   _9 ?Varicose veins   _10 ?Non-healing ulcers Pulmonary:   _11 ?Uses home oxygen   _12 ?Productive cough   _13 ?Hemoptysis   _14 ?Wheeze  _15 ?COPD   _16 ?Asthma Neurologic:  _17 ?Dizziness  _18 ?Blackouts   _19 ?Seizures   _20 ?History of stroke   _21 ?History of TIA  _22 ?Aphasia   _23 ?Temporary blindness   _24 ?Dysphagia   _25 ?Weakness or numbness in arms   _26 ?Weakness or numbness in legs Musculoskeletal:  _27 ?Arthritis   _28 ?Joint swelling   _29 ?Joint pain   _30 ?Low back pain Hematologic:  _31 ?Easy bruising  _32 ?Easy bleeding   _33 ?Hypercoagulable state   _34 ?Anemic   Gastrointestinal:  _35 ?Blood in stool   _36 ?Vomiting blood  _37 ?Gastroesophageal reflux/heartburn   _38 ?Abdominal pain Genitourinary:  _39 ?Chronic kidney disease   _40 ?Difficult urination  _41 ?Frequent urination  _42 ?Burning with urination   _43 ?Hematuria Skin:  _44 ?Rashes   _45 ?Ulcers   _46 ?Wounds Psychological:  _47 ?History of anxiety   _48 ? History of major depression.  Physical Examination  BP 129/73 (BP Location: Left Arm)   Pulse 98   Resp 16  Gen:  WD/WN, NAD Head: [place]/AT, No temporalis wasting. Ear/Nose/Throat: Hearing grossly intact, nares w/o erythema or  drainage Eyes: Conjunctiva clear. Sclera non-icteric Neck: Supple.  Trachea midline Pulmonary:  Good air movement, no use of accessory muscles on supplemental oxygen.  Cardiac: Tachycardic Vascular:  Vessel Right Left  Radial Palpable Palpable               Musculoskeletal: M/S 5/5 throughout.  There are gangrenous changes to the tip of the right great toe with no erythema or drainage.  This involves about a third of the toe.  The left foot wound as described above is improving.  The second metatarsal head is exposed.  There is some erythema at the base of the left great toe evident today with some mild swelling.  Overall, there is good granulation and the size of the wound has significantly decreased. Neurologic: Sensation grossly intact in extremities.  Symmetrical.  Speech is fluent.  Psychiatric: Judgment intact, Mood & affect appropriate for pt's clinical situation. Dermatologic: As above       Labs Recent Results (from the past 2160 hour(s))  Urinalysis, Complete     Status: Abnormal   Collection Time: 09/16/20  9:29 AM  Result Value Ref Range   Specific Gravity, UA 1.025 1.005 - 1.030   pH, UA 6.0 5.0 - 7.5   Color, UA Orange Yellow   Appearance Ur Cloudy (A) Clear   Leukocytes,UA 3+ (A) Negative   Protein,UA 3+ (A) Negative/Trace   Glucose, UA Negative Negative   Ketones, UA Negative Negative   RBC, UA 3+ (A) Negative   Bilirubin, UA Negative Negative   Urobilinogen, Ur 0.2 0.2 - 1.0 mg/dL   Nitrite, UA Negative Negative   Microscopic Examination See below:   CULTURE, URINE COMPREHENSIVE     Status: Abnormal   Collection Time: 09/16/20  9:29 AM   Specimen: Urine   UR  Result Value Ref Range   Urine Culture, Comprehensive Final report (A)    Organism ID, Bacteria Yeast isolated. (A)     Comment: 50,000-100,000 colony forming units per mL Request for further identification must be made within 1 week.  Microscopic Examination     Status: Abnormal   Collection  Time: 09/16/20  9:29 AM   Urine  Result Value Ref Range   WBC, UA >30 (A) 0 - 5 /hpf   RBC >30 (A) 0 - 2 /hpf   Epithelial Cells (non renal) 0-10 0 - 10 /hpf   Bacteria, UA Moderate (A) None seen/Few   Yeast, UA Present (A) None seen  CBC with Differential/Platelet     Status: Abnormal   Collection Time: 09/23/20 10:22 AM  Result Value Ref Range   WBC 16.8 (H) 3.4 - 10.8 x10E3/uL   RBC 3.70 (L) 3.77 - 5.28 x10E6/uL   Hemoglobin 12.3 11.1 - 15.9 g/dL   Hematocrit 35.9 34.0 - 46.6 %   MCV 97 79 - 97 fL   MCH 33.2 (H) 26.6 - 33.0 pg   MCHC 34.3 31.5 - 35.7 g/dL   RDW 15.1 11.7 - 15.4 %   Platelets 366 150 - 450 x10E3/uL   Neutrophils 80 Not Estab. %   Lymphs 9 Not Estab. %   Monocytes 8 Not Estab. %   Eos 2 Not Estab. %   Basos 0 Not Estab. %   Neutrophils Absolute 13.4 (H) 1.4 - 7.0 x10E3/uL   Lymphocytes Absolute 1.5 0.7 - 3.1 x10E3/uL   Monocytes Absolute 1.3 (H) 0.1 - 0.9 x10E3/uL   EOS (ABSOLUTE) 0.3 0.0 - 0.4 x10E3/uL   Basophils Absolute 0.1 0.0 - 0.2 x10E3/uL   Immature Granulocytes 1 Not Estab. %   Immature Grans (Abs) 0.2 (H) 0.0 - 0.1 x10E3/uL    Comment: (An elevated percentage of Immature Granulocytes has not been found to be clinically significant as a sole clinical predictor of disease. Does NOT include bands or blast cells.  Pregnancy associated physiological leukocytosis may also show increased immature granulocytes without clinical significance.)   Comprehensive metabolic panel     Status: Abnormal   Collection Time: 09/23/20 10:22 AM  Result Value Ref Range   Glucose 133 (H) 65 - 99 mg/dL   BUN 21 8 - 27 mg/dL   Creatinine, Ser 1.04 (H) 0.57 - 1.00 mg/dL   GFR calc non Af Amer 57 (L) >59 mL/min/1.73   GFR calc Af Amer 65 >59 mL/min/1.73    Comment: **In accordance with recommendations from the NKF-ASN Task force,**   [facility] is in the process of updating its eGFR calculation to the   2021 CKD-EPI creatinine equation that estimates kidney function    without a race variable.    BUN/Creatinine Ratio 20 12 - 28   Sodium 144 134 - 144 mmol/L   Potassium 3.3 (L) 3.5 - 5.2 mmol/L   Chloride 99 96 - 106 mmol/L   CO2 28 20 - 29 mmol/L   Calcium 9.7 8.7 - 10.3 mg/dL   Total Protein 6.2 6.0 - 8.5 g/dL   Albumin 3.8 3.8 - 4.8 g/dL   Globulin, Total 2.4 1.5 - 4.5 g/dL   Albumin/Globulin Ratio 1.6 1.2 - 2.2   Bilirubin Total 0.4 0.0 - 1.2 mg/dL   Alkaline Phosphatase 67 44 - 121 IU/L    Comment:               **Please note reference interval change**   AST 18 0 - 40 IU/L   ALT 17 0 - 32 IU/L  Urinalysis, Complete     Status: Abnormal   Collection Time: 09/25/20 10:55 AM  Result Value Ref Range   Specific Gravity, UA 1.025 1.005 - 1.030  pH, UA 6.5 5.0 - 7.5   Color, UA Yellow Yellow   Appearance Ur Cloudy (A) Clear   Leukocytes,UA 3+ (A) Negative   Protein,UA 3+ (A) Negative/Trace   Glucose, UA Negative Negative   Ketones, UA Negative Negative   RBC, UA 3+ (A) Negative   Bilirubin, UA Negative Negative   Urobilinogen, Ur 0.2 0.2 - 1.0 mg/dL   Nitrite, UA Negative Negative   Microscopic Examination See below:   Microscopic Examination     Status: Abnormal   Collection Time: 09/25/20 10:55 AM   Urine  Result Value Ref Range   WBC, UA >30 (H) 0 - 5 /hpf   RBC >30 (H) 0 - 2 /hpf   Epithelial Cells (non renal) None seen 0 - 10 /hpf   Bacteria, UA Many (A) None seen/Few  CULTURE, URINE COMPREHENSIVE     Status: None   Collection Time: 09/25/20 11:19 AM   Specimen: Urine   UR  Result Value Ref Range   Urine Culture, Comprehensive Final report    Organism ID, Bacteria Comment     Comment: Mixed urogenital flora 10,000-25,000 colony forming units per mL   SARS CORONAVIRUS 2 (TAT 6-24 HRS) Nasopharyngeal Nasopharyngeal Swab     Status: None   Collection Time: 09/30/20  1:33 PM   Specimen: Nasopharyngeal Swab  Result Value Ref Range   SARS Coronavirus 2 NEGATIVE NEGATIVE    Comment: (NOTE) SARS-CoV-2 target nucleic acids are  NOT DETECTED.  The SARS-CoV-2 RNA is generally detectable in upper and lower respiratory specimens during the acute phase of infection. Negative results do not preclude SARS-CoV-2 infection, do not rule out co-infections with other pathogens, and should not be used as the sole basis for treatment or other patient management decisions. Negative results must be combined with clinical observations, patient history, and epidemiological information. The expected result is Negative.  Fact Sheet for Patients: SugarRoll.be  Fact Sheet for Healthcare Providers: https://www.woods-mathews.com/  This test is not yet approved or cleared by the [place] FDA and  has been authorized for detection and/or diagnosis of SARS-CoV-2 by FDA under an Emergency Use Authorization (EUA). This EUA will remain  in effect (meaning this test can be used) for the duration of the COVID-19 declaration under Se ction 564(b)(1) of the Act, 21 U.S.C. section 360bbb-3(b)(1), unless the authorization is terminated or revoked sooner.  Performed at [facility], [place] [address] [place], [place] [address]   CBC     Status: Abnormal   Collection Time: 09/30/20  1:47 PM  Result Value Ref Range   WBC 20.7 (H) 4.0 - 10.5 K/uL   RBC 3.69 (L) 3.87 - 5.11 MIL/uL   Hemoglobin 12.1 12.0 - 15.0 g/dL   HCT 36.2 36.0 - 46.0 %   MCV 98.1 80.0 - 100.0 fL   MCH 32.8 26.0 - 34.0 pg   MCHC 33.4 30.0 - 36.0 g/dL   RDW 15.0 11.5 - 15.5 %   Platelets 330 150 - 400 K/uL   nRBC 0.0 0.0 - 0.2 %    Comment: Performed at [facility], [address] [place], [place] [address]  APTT     Status: None   Collection Time: 09/30/20  1:47 PM  Result Value Ref Range   aPTT 30 24 - 36 seconds    Comment: Performed at [facility], [address], [place], [place] [address]  Protime-INR     Status: None   Collection Time: 09/30/20  1:47  PM  Result Value Ref Range    Prothrombin Time 13.7 11.4 - 15.2 seconds   INR 1.1 0.8 - 1.2    Comment: (NOTE) INR goal varies based on device and disease states. Performed at [facility], [address] [place], [place] [address]   Basic metabolic panel     Status: Abnormal   Collection Time: 09/30/20  1:47 PM  Result Value Ref Range   Sodium 139 135 - 145 mmol/L   Potassium 3.9 3.5 - 5.1 mmol/L   Chloride 99 98 - 111 mmol/L   CO2 28 22 - 32 mmol/L   Glucose, Bld 142 (H) 70 - 99 mg/dL    Comment: Glucose reference range applies only to samples taken after fasting for at least 8 hours.   BUN 29 (H) 8 - 23 mg/dL   Creatinine, Ser 1.09 (H) 0.44 - 1.00 mg/dL   Calcium 9.2 8.9 - 10.3 mg/dL   GFR, Estimated 56 (L) >60 mL/min    Comment: (NOTE) Calculated using the CKD-EPI Creatinine Equation (2021)    Anion gap 12 5 - 15    Comment: Performed at [facility], [address] [place], [place] [address]  Urinalysis, Complete     Status: Abnormal   Collection Time: 10/07/20 10:16 AM  Result Value Ref Range   Specific Gravity, UA 1.020 1.005 - 1.030   pH, UA 6.5 5.0 - 7.5   Color, UA Red (A) Yellow   Appearance Ur Cloudy (A) Clear   Leukocytes,UA 2+ (A) Negative   Protein,UA 3+ (A) Negative/Trace   Glucose, UA Negative Negative   Ketones, UA Negative Negative   RBC, UA 3+ (A) Negative   Bilirubin, UA Negative Negative   Urobilinogen, Ur 0.2 0.2 - 1.0 mg/dL   Nitrite, UA Negative Negative   Microscopic Examination See below:     Comment: Results from sub-optimal specimen performed at the client's request. Interpret result(s) with caution. Microscopic exam performed on unspun urine. Reference range(s) not established for this type of specimen.   Microscopic Examination     Status: Abnormal   Collection Time: 10/07/20 10:16 AM   Urine  Result Value Ref Range   WBC, UA 6-10 (A) 0 - 5 /hpf   RBC >30 (A) 0 - 2 /hpf   Epithelial Cells (non renal) 0-10 0 - 10 /hpf   Casts Present (A) None  seen /lpf   Cast Type Hyaline casts N/A   Bacteria, UA Few None seen/Few    Radiology Ultrasound renal complete  Result Date: 11/20/2020 CLINICAL DATA:  Hydronephrosis. EXAM: RENAL / URINARY TRACT ULTRASOUND COMPLETE COMPARISON:  Renal ultrasound November 09, 2020 FINDINGS: Right Kidney: Renal measurements: 9.9 x 4.9 x 4.5 cm = volume: 116 mL. Increased echogenicity. No mass or hydronephrosis visualized. Left Kidney: Renal measurements: 10.1 x 4.5 x 3.9 cm = volume: 92 mL. Increased echogenicity. Similar mild hydronephrosis. No mass visualized. Bladder: Appears normal for degree of bladder distention. Other: Bilateral ureteral jets visualized. IMPRESSION: 1. Similar mild left hydronephrosis.  Resolved right hydronephrosis. 2. Increased echogenicity of the kidneys suggesting chronic medical renal disease. Electronically Signed   By: Dahlia Bailiff MD   On: 11/20/2020 23:23   Ultrasound renal complete  Result Date: 11/09/2020 CLINICAL DATA:  Nephrolithiasis.  Evaluate hydro. EXAM: RENAL / URINARY TRACT ULTRASOUND COMPLETE COMPARISON:  CT abdomen and pelvis 07/17/2020 FINDINGS: Right Kidney: Renal measurements: 8.9 x 4.4 x 5.3 cm = volume: 108 mL. Diffuse parenchymal thinning and increased echotexture. Mild  hydronephrosis. Echogenic shadowing focus in the mid pole consistent with a stone measuring 7 mm. No solid mass. Left Kidney: Renal measurements: 9.8 x 4.8 x 4.5 cm = volume: 112 mL. Diffuse parenchymal thinning. Mild hydronephrosis. No stones identified. No solid mass. Bladder: No wall thickening or filling defect. Bilateral urine flow jets are demonstrated. No significant postvoid residual. Other: None. IMPRESSION: 1. Mild bilateral renal hydronephrosis. 2. Nonobstructing intrarenal stone in the right kidney. 3. Diffuse parenchymal thinning in both kidneys suggesting chronic renal disease. Electronically Signed   By: Lucienne Capers M.D.   On: 11/09/2020 19:34    Assessment/Plan CKD (chronic kidney  disease) stage 3, GFR 30-59 ml/min (HCC) Avoid or limit contrast as possible.  Hyperlipidemia, mixed lipid control important in reducing the progression of atherosclerotic disease. Continue statin therapy   Pulmonary hypertension ([place]) Fairly significant and on supplemental oxygen.  Stable at this time.  Essential hypertension blood pressure control important in reducing the progression of atherosclerotic disease. On appropriate oral medications.  Gangrene of left foot [facility]) Her husband is a physician and is doing a tremendous job of caring for the wounds and debriding as needed.    The second and third toes are now gone and the second metatarsal head is exposed and will eventually need to be debrided.  There is some mild erythema today so we are going to treat her with some antibiotics to get this under control.  She has no pain and waiting on these is perfectly reasonable as we have gained a lot of tissue by being patient over the last 3 months.  As long as she is not showing any signs of worsening infection and she continues to be pain-free, continuing local wound care would be the best option.  I will plan to see her back in about a month to check the wound.    Leotis Pain, MD  12/01/2020 9:23 AM    This note was created with Dragon medical transcription system.  Any errors from dictation are purely unintentional

## 2020-12-02 DIAGNOSIS — N189 Chronic kidney disease, unspecified: Secondary | ICD-10-CM | POA: Diagnosis not present

## 2020-12-02 DIAGNOSIS — D696 Thrombocytopenia, unspecified: Secondary | ICD-10-CM | POA: Diagnosis not present

## 2020-12-02 DIAGNOSIS — Z4800 Encounter for change or removal of nonsurgical wound dressing: Secondary | ICD-10-CM | POA: Diagnosis not present

## 2020-12-02 DIAGNOSIS — I1310 Hypertensive heart and chronic kidney disease without heart failure, with stage 1 through stage 4 chronic kidney disease, or unspecified chronic kidney disease: Secondary | ICD-10-CM | POA: Diagnosis not present

## 2020-12-02 DIAGNOSIS — N179 Acute kidney failure, unspecified: Secondary | ICD-10-CM | POA: Diagnosis not present

## 2020-12-02 DIAGNOSIS — D62 Acute posthemorrhagic anemia: Secondary | ICD-10-CM | POA: Diagnosis not present

## 2020-12-02 DIAGNOSIS — J45909 Unspecified asthma, uncomplicated: Secondary | ICD-10-CM | POA: Diagnosis not present

## 2020-12-02 DIAGNOSIS — R339 Retention of urine, unspecified: Secondary | ICD-10-CM | POA: Diagnosis not present

## 2020-12-02 DIAGNOSIS — I272 Pulmonary hypertension, unspecified: Secondary | ICD-10-CM | POA: Diagnosis not present

## 2020-12-02 DIAGNOSIS — D860 Sarcoidosis of lung: Secondary | ICD-10-CM | POA: Diagnosis not present

## 2020-12-02 DIAGNOSIS — H9190 Unspecified hearing loss, unspecified ear: Secondary | ICD-10-CM | POA: Diagnosis not present

## 2020-12-02 DIAGNOSIS — I088 Other rheumatic multiple valve diseases: Secondary | ICD-10-CM | POA: Diagnosis not present

## 2020-12-02 DIAGNOSIS — R739 Hyperglycemia, unspecified: Secondary | ICD-10-CM | POA: Diagnosis not present

## 2020-12-02 DIAGNOSIS — I70223 Atherosclerosis of native arteries of extremities with rest pain, bilateral legs: Secondary | ICD-10-CM | POA: Diagnosis not present

## 2020-12-02 DIAGNOSIS — E039 Hypothyroidism, unspecified: Secondary | ICD-10-CM | POA: Diagnosis not present

## 2020-12-02 DIAGNOSIS — T380X5D Adverse effect of glucocorticoids and synthetic analogues, subsequent encounter: Secondary | ICD-10-CM | POA: Diagnosis not present

## 2020-12-08 ENCOUNTER — Encounter: Payer: Self-pay | Admitting: Internal Medicine

## 2020-12-08 ENCOUNTER — Other Ambulatory Visit: Payer: Self-pay | Admitting: Internal Medicine

## 2020-12-08 DIAGNOSIS — E039 Hypothyroidism, unspecified: Secondary | ICD-10-CM | POA: Diagnosis not present

## 2020-12-08 DIAGNOSIS — R339 Retention of urine, unspecified: Secondary | ICD-10-CM | POA: Diagnosis not present

## 2020-12-08 DIAGNOSIS — N179 Acute kidney failure, unspecified: Secondary | ICD-10-CM | POA: Diagnosis not present

## 2020-12-08 DIAGNOSIS — I088 Other rheumatic multiple valve diseases: Secondary | ICD-10-CM | POA: Diagnosis not present

## 2020-12-08 DIAGNOSIS — R739 Hyperglycemia, unspecified: Secondary | ICD-10-CM | POA: Diagnosis not present

## 2020-12-08 DIAGNOSIS — Z4800 Encounter for change or removal of nonsurgical wound dressing: Secondary | ICD-10-CM | POA: Diagnosis not present

## 2020-12-08 DIAGNOSIS — H9190 Unspecified hearing loss, unspecified ear: Secondary | ICD-10-CM | POA: Diagnosis not present

## 2020-12-08 DIAGNOSIS — D696 Thrombocytopenia, unspecified: Secondary | ICD-10-CM | POA: Diagnosis not present

## 2020-12-08 DIAGNOSIS — D860 Sarcoidosis of lung: Secondary | ICD-10-CM | POA: Diagnosis not present

## 2020-12-08 DIAGNOSIS — D62 Acute posthemorrhagic anemia: Secondary | ICD-10-CM | POA: Diagnosis not present

## 2020-12-08 DIAGNOSIS — J45909 Unspecified asthma, uncomplicated: Secondary | ICD-10-CM | POA: Diagnosis not present

## 2020-12-08 DIAGNOSIS — N189 Chronic kidney disease, unspecified: Secondary | ICD-10-CM | POA: Diagnosis not present

## 2020-12-08 DIAGNOSIS — T380X5D Adverse effect of glucocorticoids and synthetic analogues, subsequent encounter: Secondary | ICD-10-CM | POA: Diagnosis not present

## 2020-12-08 DIAGNOSIS — I1310 Hypertensive heart and chronic kidney disease without heart failure, with stage 1 through stage 4 chronic kidney disease, or unspecified chronic kidney disease: Secondary | ICD-10-CM | POA: Diagnosis not present

## 2020-12-08 DIAGNOSIS — I272 Pulmonary hypertension, unspecified: Secondary | ICD-10-CM | POA: Diagnosis not present

## 2020-12-08 DIAGNOSIS — I70223 Atherosclerosis of native arteries of extremities with rest pain, bilateral legs: Secondary | ICD-10-CM | POA: Diagnosis not present

## 2020-12-08 MED ORDER — LEVOTHYROXINE SODIUM 137 MCG PO TABS: ORAL_TABLET | ORAL | 3 refills | Status: DC

## 2020-12-14 DIAGNOSIS — I6340 Cerebral infarction due to embolism of unspecified cerebral artery: Secondary | ICD-10-CM | POA: Diagnosis not present

## 2020-12-15 DIAGNOSIS — I1310 Hypertensive heart and chronic kidney disease without heart failure, with stage 1 through stage 4 chronic kidney disease, or unspecified chronic kidney disease: Secondary | ICD-10-CM | POA: Diagnosis not present

## 2020-12-15 DIAGNOSIS — D696 Thrombocytopenia, unspecified: Secondary | ICD-10-CM | POA: Diagnosis not present

## 2020-12-15 DIAGNOSIS — H9190 Unspecified hearing loss, unspecified ear: Secondary | ICD-10-CM | POA: Diagnosis not present

## 2020-12-15 DIAGNOSIS — D62 Acute posthemorrhagic anemia: Secondary | ICD-10-CM | POA: Diagnosis not present

## 2020-12-15 DIAGNOSIS — T380X5D Adverse effect of glucocorticoids and synthetic analogues, subsequent encounter: Secondary | ICD-10-CM | POA: Diagnosis not present

## 2020-12-15 DIAGNOSIS — I70223 Atherosclerosis of native arteries of extremities with rest pain, bilateral legs: Secondary | ICD-10-CM | POA: Diagnosis not present

## 2020-12-15 DIAGNOSIS — R339 Retention of urine, unspecified: Secondary | ICD-10-CM | POA: Diagnosis not present

## 2020-12-15 DIAGNOSIS — I272 Pulmonary hypertension, unspecified: Secondary | ICD-10-CM | POA: Diagnosis not present

## 2020-12-15 DIAGNOSIS — N189 Chronic kidney disease, unspecified: Secondary | ICD-10-CM | POA: Diagnosis not present

## 2020-12-15 DIAGNOSIS — I088 Other rheumatic multiple valve diseases: Secondary | ICD-10-CM | POA: Diagnosis not present

## 2020-12-15 DIAGNOSIS — R739 Hyperglycemia, unspecified: Secondary | ICD-10-CM | POA: Diagnosis not present

## 2020-12-15 DIAGNOSIS — Z4800 Encounter for change or removal of nonsurgical wound dressing: Secondary | ICD-10-CM | POA: Diagnosis not present

## 2020-12-15 DIAGNOSIS — N179 Acute kidney failure, unspecified: Secondary | ICD-10-CM | POA: Diagnosis not present

## 2020-12-15 DIAGNOSIS — E039 Hypothyroidism, unspecified: Secondary | ICD-10-CM | POA: Diagnosis not present

## 2020-12-15 DIAGNOSIS — J45909 Unspecified asthma, uncomplicated: Secondary | ICD-10-CM | POA: Diagnosis not present

## 2020-12-15 DIAGNOSIS — D860 Sarcoidosis of lung: Secondary | ICD-10-CM | POA: Diagnosis not present

## 2020-12-22 ENCOUNTER — Other Ambulatory Visit (INDEPENDENT_AMBULATORY_CARE_PROVIDER_SITE_OTHER): Payer: BC Managed Care – PPO | Admitting: Internal Medicine

## 2020-12-22 ENCOUNTER — Telehealth (INDEPENDENT_AMBULATORY_CARE_PROVIDER_SITE_OTHER): Payer: Self-pay

## 2020-12-22 DIAGNOSIS — G4733 Obstructive sleep apnea (adult) (pediatric): Secondary | ICD-10-CM

## 2020-12-22 DIAGNOSIS — R339 Retention of urine, unspecified: Secondary | ICD-10-CM

## 2020-12-22 DIAGNOSIS — I272 Pulmonary hypertension, unspecified: Secondary | ICD-10-CM

## 2020-12-22 DIAGNOSIS — Z4800 Encounter for change or removal of nonsurgical wound dressing: Secondary | ICD-10-CM

## 2020-12-22 DIAGNOSIS — H9190 Unspecified hearing loss, unspecified ear: Secondary | ICD-10-CM

## 2020-12-22 DIAGNOSIS — I70223 Atherosclerosis of native arteries of extremities with rest pain, bilateral legs: Secondary | ICD-10-CM

## 2020-12-22 DIAGNOSIS — E039 Hypothyroidism, unspecified: Secondary | ICD-10-CM

## 2020-12-22 DIAGNOSIS — D860 Sarcoidosis of lung: Secondary | ICD-10-CM

## 2020-12-22 DIAGNOSIS — Z7982 Long term (current) use of aspirin: Secondary | ICD-10-CM

## 2020-12-22 DIAGNOSIS — I088 Other rheumatic multiple valve diseases: Secondary | ICD-10-CM

## 2020-12-22 DIAGNOSIS — I6340 Cerebral infarction due to embolism of unspecified cerebral artery: Secondary | ICD-10-CM

## 2020-12-22 DIAGNOSIS — N189 Chronic kidney disease, unspecified: Secondary | ICD-10-CM

## 2020-12-22 DIAGNOSIS — D649 Anemia, unspecified: Secondary | ICD-10-CM

## 2020-12-22 DIAGNOSIS — E782 Mixed hyperlipidemia: Secondary | ICD-10-CM

## 2020-12-22 DIAGNOSIS — D696 Thrombocytopenia, unspecified: Secondary | ICD-10-CM

## 2020-12-22 DIAGNOSIS — I1310 Hypertensive heart and chronic kidney disease without heart failure, with stage 1 through stage 4 chronic kidney disease, or unspecified chronic kidney disease: Secondary | ICD-10-CM

## 2020-12-22 DIAGNOSIS — D62 Acute posthemorrhagic anemia: Secondary | ICD-10-CM

## 2020-12-22 DIAGNOSIS — K581 Irritable bowel syndrome with constipation: Secondary | ICD-10-CM

## 2020-12-22 DIAGNOSIS — T380X5D Adverse effect of glucocorticoids and synthetic analogues, subsequent encounter: Secondary | ICD-10-CM

## 2020-12-22 DIAGNOSIS — K219 Gastro-esophageal reflux disease without esophagitis: Secondary | ICD-10-CM

## 2020-12-22 DIAGNOSIS — Z9981 Dependence on supplemental oxygen: Secondary | ICD-10-CM

## 2020-12-22 NOTE — Progress Notes (Signed)
Received orders from [facility] home [facility]. Start of care 08/17/20.   Orders from 12/15/20 through 02/12/21 are reviewed, signed and faxed.

## 2020-12-22 NOTE — Telephone Encounter (Signed)
Pt called and left a VM on the nurses line saying that she has a possible infection in her toe an was previously prescribed an antibiotic Per Dr. Lucky Cowboy I called in Augmentin 875 Mg a 10 day supply to be taken two times a day I called the pt an made her aware.

## 2020-12-24 DIAGNOSIS — R339 Retention of urine, unspecified: Secondary | ICD-10-CM | POA: Diagnosis not present

## 2020-12-24 DIAGNOSIS — D62 Acute posthemorrhagic anemia: Secondary | ICD-10-CM | POA: Diagnosis not present

## 2020-12-24 DIAGNOSIS — J45909 Unspecified asthma, uncomplicated: Secondary | ICD-10-CM | POA: Diagnosis not present

## 2020-12-24 DIAGNOSIS — H9190 Unspecified hearing loss, unspecified ear: Secondary | ICD-10-CM | POA: Diagnosis not present

## 2020-12-24 DIAGNOSIS — T380X5D Adverse effect of glucocorticoids and synthetic analogues, subsequent encounter: Secondary | ICD-10-CM | POA: Diagnosis not present

## 2020-12-24 DIAGNOSIS — N189 Chronic kidney disease, unspecified: Secondary | ICD-10-CM | POA: Diagnosis not present

## 2020-12-24 DIAGNOSIS — R739 Hyperglycemia, unspecified: Secondary | ICD-10-CM | POA: Diagnosis not present

## 2020-12-24 DIAGNOSIS — Z4800 Encounter for change or removal of nonsurgical wound dressing: Secondary | ICD-10-CM | POA: Diagnosis not present

## 2020-12-24 DIAGNOSIS — I088 Other rheumatic multiple valve diseases: Secondary | ICD-10-CM | POA: Diagnosis not present

## 2020-12-24 DIAGNOSIS — I272 Pulmonary hypertension, unspecified: Secondary | ICD-10-CM | POA: Diagnosis not present

## 2020-12-24 DIAGNOSIS — D696 Thrombocytopenia, unspecified: Secondary | ICD-10-CM | POA: Diagnosis not present

## 2020-12-24 DIAGNOSIS — N179 Acute kidney failure, unspecified: Secondary | ICD-10-CM | POA: Diagnosis not present

## 2020-12-24 DIAGNOSIS — D860 Sarcoidosis of lung: Secondary | ICD-10-CM | POA: Diagnosis not present

## 2020-12-24 DIAGNOSIS — E039 Hypothyroidism, unspecified: Secondary | ICD-10-CM | POA: Diagnosis not present

## 2020-12-24 DIAGNOSIS — I70223 Atherosclerosis of native arteries of extremities with rest pain, bilateral legs: Secondary | ICD-10-CM | POA: Diagnosis not present

## 2020-12-24 DIAGNOSIS — I1310 Hypertensive heart and chronic kidney disease without heart failure, with stage 1 through stage 4 chronic kidney disease, or unspecified chronic kidney disease: Secondary | ICD-10-CM | POA: Diagnosis not present

## 2020-12-29 ENCOUNTER — Ambulatory Visit (INDEPENDENT_AMBULATORY_CARE_PROVIDER_SITE_OTHER): Payer: BC Managed Care – PPO | Admitting: Vascular Surgery

## 2020-12-29 ENCOUNTER — Other Ambulatory Visit: Payer: Self-pay

## 2020-12-29 VITALS — BP 147/76 | HR 106 | Ht 67.0 in | Wt 176.0 lb

## 2020-12-29 DIAGNOSIS — I96 Gangrene, not elsewhere classified: Secondary | ICD-10-CM | POA: Diagnosis not present

## 2020-12-29 DIAGNOSIS — I10 Essential (primary) hypertension: Secondary | ICD-10-CM

## 2020-12-29 DIAGNOSIS — E782 Mixed hyperlipidemia: Secondary | ICD-10-CM

## 2020-12-29 DIAGNOSIS — I272 Pulmonary hypertension, unspecified: Secondary | ICD-10-CM

## 2020-12-29 DIAGNOSIS — I70223 Atherosclerosis of native arteries of extremities with rest pain, bilateral legs: Secondary | ICD-10-CM

## 2020-12-29 DIAGNOSIS — N183 Chronic kidney disease, stage 3 unspecified: Secondary | ICD-10-CM

## 2020-12-29 NOTE — Assessment & Plan Note (Signed)
Gangrenous changes to the tip of the right great toe with no erythema or drainage.  This involves roughly 1/4 of the toe.  This is improved over time.  The left foot wound has a second metatarsal head which is exposed and preventing granulation tissue on top of it.  The tissue around it is granulating well.  The size of the wound continues to shrink.  The left great toe is gangrenous about halfway to two thirds of the way down the toe.  There is some granulation just below this and then the skin edges just above the metatarsal head.  The erythema is better than it was at her last visit, and has essentially resolved with antibiotics at this time. I had a long discussion with the patient and her husband today.  Since she is having some pain, and some recurrent erythema, we feel it is probably time to go ahead and remove that left great toe.  I am hopeful we can do this through an incision well above the metatarsal phalangeal joint and get the toe out through this with some room to close over top of it.  While we are there, it would be prudent to go ahead and remove the second metatarsal head as that is preventing granulation in that area.  Looking at the schedule, we will get this booked in the next couple of weeks at their convenience.  She will continue her antibiotics for now if she develops recurrent signs of infection they will call our office for a refill.

## 2020-12-29 NOTE — H&P (View-Only) (Signed)
MRN : [id number]  Stacie [name] is a 65 y.o. (03-25-1956) female who presents with chief complaint of  Chief Complaint  Patient presents with  . Follow-up    1 mo no studies   .  History of Present Illness: Patient returns today in follow up of her foot wounds.  This is predominantly on the left foot with the tip of the right great toe gangrene continuing to improve.  This is dry and she is healing underneath it.  She has had 2 rounds of antibiotics for some erythema and purulent drainage from the left great toes since her last visit.  It is dry today but she is currently on antibiotics.  No fevers or chills.  That has actually helped her urine as well.  She is having some intermittent pain in that left great toe now.  Her second metatarsal head remains exposed and is preventing some granulation in that area, but overall the wound is much smaller than it was previously.  Current Outpatient Medications  Medication Sig Dispense Refill  . acetaminophen (TYLENOL) 325 MG tablet Take 2 tablets (650 mg total) by mouth every 4 (four) hours as needed for mild pain or fever.    Marland Kitchen amLODipine (NORVASC) 10 MG tablet Take 1 tablet by mouth daily.    Marland Kitchen amoxicillin-clavulanate (AUGMENTIN) 875-125 MG tablet Take 1 tablet by mouth every 12 (twelve) hours. 20 tablet 0  . aspirin EC 81 MG EC tablet Take 1 tablet (81 mg total) by mouth daily. Swallow whole. 30 tablet 11  . atorvastatin (LIPITOR) 40 MG tablet Take 1 tablet (40 mg total) by mouth daily. 90 tablet 3  . budesonide-formoterol (SYMBICORT) 80-4.5 MCG/ACT inhaler Take 2 puffs first thing in am and then another 2 puffs about 12 hours later. (Patient taking differently: Inhale 2 puffs into the lungs in the morning and at bedtime.) 6.9 g 1  . denosumab (PROLIA) 60 MG/ML SOSY injection Inject 60 mg into the skin every 6 (six) months.    . dorzolamide (TRUSOPT) 2 % ophthalmic solution Place 1 drop into both eyes 2 (two) times daily.    Marland Kitchen escitalopram  (LEXAPRO) 20 MG tablet TAKE 1 TABLET BY MOUTH EVERY DAY 90 tablet 0  . folic acid (FOLVITE) 1 MG tablet Take 1 tablet (1 mg total) by mouth daily. 30 tablet 11  . latanoprost (XALATAN) 0.005 % ophthalmic solution Place 1 drop into the right eye at bedtime.    Marland Kitchen levothyroxine (SYNTHROID) 137 MCG tablet TAKE 1 TABLET (137 MCG TOTAL) BY MOUTH DAILY BEFORE BREAKFAST. 90 tablet 3  . methotrexate (RHEUMATREX) 2.5 MG tablet Take by mouth.    . methotrexate (RHEUMATREX) 5 MG tablet Take 5 tablets (25 mg total) by mouth once a week. Caution:Chemotherapy. Protect from light. 4 tablet 0  . Multiple Vitamins-Minerals (MULTIVITAMIN WITH MINERALS) tablet Take 1 tablet by mouth daily.    . NON FORMULARY CPAP nightly    . OXYGEN Inhale 2 L into the lungs daily as needed (During walking and activity).     . potassium citrate (UROCIT-K) 10 MEQ (1080 MG) SR tablet Take 2 tablets (20 mEq total) by mouth 3 (three) times daily with meals. 180 tablet 11  . predniSONE (DELTASONE) 10 MG tablet Take 10 mg by mouth daily.    . predniSONE (DELTASONE) 5 MG tablet Take 3 tablets (15 mg total) by mouth daily with breakfast. 60 tablet 0  . Treprostinil (TYVASO) 0.6 MG/ML SOLN Inhale 0.6 mcg into the  lungs See admin instructions. 12 breaths 4 times a day     No current facility-administered medications for this visit.    Past Medical History:  Diagnosis Date  . Acute respiratory failure with hypoxia ([place])   . Arrhythmia    patient unaware if this is current  . Asthma   . Chronic kidney disease   . Depression   . GERD (gastroesophageal reflux disease)   . Heart murmur   . History of kidney stones   . HOH (hard of hearing)    wear aids  . Hyperthyroidism   . Hypothyroidism   . IBS (irritable bowel syndrome)   . Pneumonia   . Pulmonary hypertension ([place])   . Sarcoid   . Sarcoidosis   . Seasonal allergies   . Sleep apnea CPAP with O2  . Stroke ([place]) 07/2020   [place]  . Wears hearing aid in both ears      Past Surgical History:  Procedure Laterality Date  . ABDOMINAL HYSTERECTOMY     partial  . CARDIAC CATHETERIZATION  10/18/2018   [facility]  . CATARACT EXTRACTION W/PHACO Left 07/31/2019   Procedure: CATARACT EXTRACTION PHACO AND INTRAOCULAR LENS PLACEMENT (IOC) LEFT 00:51.1  17.9%  9.15;  Surgeon: Leandrew Koyanagi, MD;  Location: [facility];  Service: Ophthalmology;  Laterality: Left;  keep this patient second  . COLON SURGERY     "colon was fused to bladder - operated on both"  . COLONOSCOPY  09/18/2007   diverticuli, no polyps  . COLONOSCOPY  05/26/2010   diverticuli, no polyps  . CYSTOSCOPY WITH STENT PLACEMENT Bilateral 07/14/2020   Procedure: CYSTOSCOPY WITH STENT PLACEMENT, RETROPYLOGRAM;  Surgeon: Billey Co, MD;  Location: [facility];  Service: Urology;  Laterality: Bilateral;  . CYSTOSCOPY/URETEROSCOPY/HOLMIUM LASER/STENT PLACEMENT Left 02/20/2020   Procedure: CYSTOSCOPY/URETEROSCOPY/LITHOTRIPSY /STENT PLACEMENT;  Surgeon: Hollice Espy, MD;  Location: [facility];  Service: Urology;  Laterality: Left;  . CYSTOSCOPY/URETEROSCOPY/HOLMIUM LASER/STENT PLACEMENT Bilateral 10/02/2020   Procedure: CYSTOSCOPY/URETEROSCOPY/HOLMIUM LASER/STENT PLACEMENT;  Surgeon: Billey Co, MD;  Location: [facility];  Service: Urology;  Laterality: Bilateral;  . EYE SURGERY    . PARS PLANA VITRECTOMY Right 05/20/2015   Procedure: PARS PLANA VITRECTOMY WITH 25 GAUGE, laser;  Surgeon: Milus Height, MD;  Location: [facility];  Service: Ophthalmology;  Laterality: Right;  . PARTIAL HYSTERECTOMY  1990  . TEE WITHOUT CARDIOVERSION N/A 07/27/2020   Procedure: TRANSESOPHAGEAL ECHOCARDIOGRAM (TEE);  Surgeon: Teodoro Spray, MD;  Location: [facility];  Service: Cardiovascular;  Laterality: N/A;  . TUBAL LIGATION    . WISDOM TOOTH EXTRACTION       Social History   Tobacco Use  . Smoking status: Never Smoker  . Smokeless tobacco: Never Used  Vaping Use  . Vaping Use: Never used  Substance  Use Topics  . Alcohol use: No  . Drug use: No      Family History  Problem Relation Age of Onset  . Allergies Father   . Asthma Father   . Colon cancer Father   . Allergies Brother   . Asthma Brother   . Breast cancer Maternal Grandmother     Allergies  Allergen Reactions  . Nitrofurantoin Nausea Only  . Tramadol     Nausea and vomiting  . Sulfa Antibiotics Rash    As an infant    REVIEW OF SYSTEMS(Negative unless checked)  Constitutional: _0 ??Weight loss _1 ??Fever _2 ??Chills Cardiac: _3 ??Chest pain _4 ??Chest pressure _5 ??Palpitations _6 ??Shortness of breath when laying flat _7 ??Shortness of breath at rest _8 ??Shortness  of breath with exertion. Vascular: _0 ??Pain in legs with walking _1 ??Pain in legs at rest _2 ??Pain in legs when laying flat _3 ??Claudication _4 ??Pain in feet when walking _5 ??Pain in feet at rest _6 ??Pain in feet when laying flat _7 ??History of DVT _8 ??Phlebitis _9 ??Swelling in legs _10 ??Varicose veins _11 ??Non-healing ulcers Pulmonary: _12 ??Uses home oxygen _13 ??Productive cough _14 ??Hemoptysis _15 ??Wheeze _16 ??COPD _17 ??Asthma Neurologic: _18 ??Dizziness _19 ??Blackouts _20 ??Seizures _21 ??History of stroke _22 ??History of TIA _23 ??Aphasia _24 ??Temporary blindness _25 ??Dysphagia _26 ??Weakness or numbness in arms _27 ??Weakness or numbness in legs Musculoskeletal: _28 ??Arthritis _29 ??Joint swelling _30 ??Joint pain _31 ??Low back pain Hematologic: _32 ??Easy bruising _33 ??Easy bleeding _34 ??Hypercoagulable state _35 ??Anemic  Gastrointestinal: _36 ??Blood in stool _37 ??Vomiting blood _38 ??Gastroesophageal reflux/heartburn _39 ??Abdominal pain Genitourinary: _40 ??Chronic kidney disease _41 ??Difficult urination _42 ??Frequent urination _43 ??Burning with urination _44 ??Hematuria Skin: _45 ??Rashes _46 ??Ulcers _47 ??Wounds Psychological: _48 ??History of anxiety _49 ??History of major depression.   Physical  Examination  BP (!) 147/76   Pulse (!) 106   Ht _50  (1.702 m)   Wt 176 lb (79.8 kg)   BMI 27.57 kg/m  Gen:  WD/WN, NAD Head: [place]/AT, No temporalis wasting. Ear/Nose/Throat: Hearing grossly intact, nares w/o erythema or drainage Eyes: Conjunctiva clear. Sclera non-icteric Neck: Supple.  Trachea midline Pulmonary:  Good air movement, no use of accessory muscles on supplemental oxygen.  Cardiac: RRR, no JVD Vascular:  Vessel Right Left  Radial Palpable Palpable                          PT Palpable Palpable  DP Palpable Palpable    Musculoskeletal: M/S 5/5 throughout.  Gangrenous changes to the tip of the right great toe with no erythema or drainage.  This involves roughly 1/4 of the toe.  This is improved over time.  The left foot wound has a second metatarsal head which is exposed and preventing granulation tissue on top of it.  The tissue around it is granulating well.  The size of the wound continues to shrink.  The left great toe is gangrenous about halfway to two thirds of the way down the toe.  There is some granulation just below this and then the skin edges just above the metatarsal head.  The erythema is better than it was at her last visit, and has essentially resolved with antibiotics at this time. Neurologic: Sensation grossly intact in extremities.  Symmetrical.  Speech is fluent.  Psychiatric: Judgment intact, Mood & affect appropriate for pt's clinical situation. Dermatologic: Left foot wound as described above       Labs Recent Results (from the past 2160 hour(s))  SARS CORONAVIRUS 2 (TAT 6-24 HRS) Nasopharyngeal Nasopharyngeal Swab     Status: None   Collection Time: 09/30/20  1:33 PM   Specimen: Nasopharyngeal Swab  Result Value Ref Range   SARS Coronavirus 2 NEGATIVE NEGATIVE    Comment: (NOTE) SARS-CoV-2 target nucleic acids are NOT DETECTED.  The SARS-CoV-2 RNA is generally detectable in upper and lower respiratory specimens during the acute phase  of infection. Negative results do not preclude SARS-CoV-2 infection, do not rule out co-infections with other pathogens, and should not be used as the sole basis for treatment or other patient management decisions. Negative results must be combined with clinical observations, patient history, and epidemiological information. The expected result is Negative.  Fact Sheet for Patients: SugarRoll.be  Fact Sheet for Healthcare Providers: https://www.woods-mathews.com/  This test is not yet approved or cleared by the [place] FDA and  has been authorized for detection and/or diagnosis of SARS-CoV-2 by FDA under an Emergency Use Authorization (  EUA). This EUA will remain  in effect (meaning this test can be used) for the duration of the COVID-19 declaration under Se ction 564(b)(1) of the Act, 21 U.S.C. section 360bbb-3(b)(1), unless the authorization is terminated or revoked sooner.  Performed at [facility], [place] [address], [place], [place] [address]   CBC     Status: Abnormal   Collection Time: 09/30/20  1:47 PM  Result Value Ref Range   WBC 20.7 (H) 4.0 - 10.5 K/uL   RBC 3.69 (L) 3.87 - 5.11 MIL/uL   Hemoglobin 12.1 12.0 - 15.0 g/dL   HCT 36.2 36.0 - 46.0 %   MCV 98.1 80.0 - 100.0 fL   MCH 32.8 26.0 - 34.0 pg   MCHC 33.4 30.0 - 36.0 g/dL   RDW 15.0 11.5 - 15.5 %   Platelets 330 150 - 400 K/uL   nRBC 0.0 0.0 - 0.2 %    Comment: Performed at [facility], [address] [place], [place] [address]  APTT     Status: None   Collection Time: 09/30/20  1:47 PM  Result Value Ref Range   aPTT 30 24 - 36 seconds    Comment: Performed at [facility], [address] [place], [place] [address]  Protime-INR     Status: None   Collection Time: 09/30/20  1:47 PM  Result Value Ref Range   Prothrombin Time 13.7 11.4 - 15.2 seconds   INR 1.1 0.8 - 1.2    Comment: (NOTE) INR goal varies based on device and disease  states. Performed at [facility], [address] [place], [place] [address]   Basic metabolic panel     Status: Abnormal   Collection Time: 09/30/20  1:47 PM  Result Value Ref Range   Sodium 139 135 - 145 mmol/L   Potassium 3.9 3.5 - 5.1 mmol/L   Chloride 99 98 - 111 mmol/L   CO2 28 22 - 32 mmol/L   Glucose, Bld 142 (H) 70 - 99 mg/dL    Comment: Glucose reference range applies only to samples taken after fasting for at least 8 hours.   BUN 29 (H) 8 - 23 mg/dL   Creatinine, Ser 1.09 (H) 0.44 - 1.00 mg/dL   Calcium 9.2 8.9 - 10.3 mg/dL   GFR, Estimated 56 (L) >60 mL/min    Comment: (NOTE) Calculated using the CKD-EPI Creatinine Equation (2021)    Anion gap 12 5 - 15    Comment: Performed at [facility], [address] [place], [place] [address]  Urinalysis, Complete     Status: Abnormal   Collection Time: 10/07/20 10:16 AM  Result Value Ref Range   Specific Gravity, UA 1.020 1.005 - 1.030   pH, UA 6.5 5.0 - 7.5   Color, UA Red (A) Yellow   Appearance Ur Cloudy (A) Clear   Leukocytes,UA 2+ (A) Negative   Protein,UA 3+ (A) Negative/Trace   Glucose, UA Negative Negative   Ketones, UA Negative Negative   RBC, UA 3+ (A) Negative   Bilirubin, UA Negative Negative   Urobilinogen, Ur 0.2 0.2 - 1.0 mg/dL   Nitrite, UA Negative Negative   Microscopic Examination See below:     Comment: Results from sub-optimal specimen performed at the client's request. Interpret result(s) with caution. Microscopic exam performed on unspun urine. Reference range(s) not established for this type of specimen.   Microscopic Examination     Status: Abnormal   Collection Time: 10/07/20 10:16 AM  Urine  Result Value Ref Range   WBC, UA 6-10 (A) 0 - 5 /hpf   RBC >30 (A) 0 - 2 /hpf   Epithelial Cells (non renal) 0-10 0 - 10 /hpf   Casts Present (A) None seen /lpf   Cast Type Hyaline casts N/A   Bacteria, UA Few None seen/Few    Radiology No results  found.  Assessment/Plan CKD (chronic kidney disease) stage 3, GFR 30-59 ml/min (HCC) Avoid or limit contrast as possible.  Hyperlipidemia, mixed lipid control important in reducing the progression of atherosclerotic disease. Continue statin therapy   Pulmonary hypertension ([place]) Fairly significant and on supplemental oxygen. Stable at this time.  Sees her pulmonologist next week.  Would plan to do her surgery with regional anesthesia and sedation and avoid general anesthesia.  Essential hypertension blood pressure control important in reducing the progression of atherosclerotic disease. On appropriate oral medications  Gangrene of left foot (HCC) Gangrenous changes to the tip of the right great toe with no erythema or drainage.  This involves roughly 1/4 of the toe.  This is improved over time.  The left foot wound has a second metatarsal head which is exposed and preventing granulation tissue on top of it.  The tissue around it is granulating well.  The size of the wound continues to shrink.  The left great toe is gangrenous about halfway to two thirds of the way down the toe.  There is some granulation just below this and then the skin edges just above the metatarsal head.  The erythema is better than it was at her last visit, and has essentially resolved with antibiotics at this time. I had a long discussion with the patient and her husband today.  Since she is having some pain, and some recurrent erythema, we feel it is probably time to go ahead and remove that left great toe.  I am hopeful we can do this through an incision well above the metatarsal phalangeal joint and get the toe out through this with some room to close over top of it.  While we are there, it would be prudent to go ahead and remove the second metatarsal head as that is preventing granulation in that area.  Looking at the schedule, we will get this booked in the next couple of weeks at their convenience.  She will  continue her antibiotics for now if she develops recurrent signs of infection they will call our office for a refill.    Leotis Pain, MD  12/29/2020 10:13 AM    This note was created with Dragon medical transcription system.  Any errors from dictation are purely unintentional

## 2020-12-29 NOTE — Progress Notes (Signed)
MRN : [id number]  Stacie [name] is a 65 y.o. (1956/06/25) female who presents with chief complaint of  Chief Complaint  Patient presents with  . Follow-up    1 mo no studies   .  History of Present Illness: Patient returns today in follow up of her foot wounds.  This is predominantly on the left foot with the tip of the right great toe gangrene continuing to improve.  This is dry and she is healing underneath it.  She has had 2 rounds of antibiotics for some erythema and purulent drainage from the left great toes since her last visit.  It is dry today but she is currently on antibiotics.  No fevers or chills.  That has actually helped her urine as well.  She is having some intermittent pain in that left great toe now.  Her second metatarsal head remains exposed and is preventing some granulation in that area, but overall the wound is much smaller than it was previously.  Current Outpatient Medications  Medication Sig Dispense Refill  . acetaminophen (TYLENOL) 325 MG tablet Take 2 tablets (650 mg total) by mouth every 4 (four) hours as needed for mild pain or fever.    Marland Kitchen amLODipine (NORVASC) 10 MG tablet Take 1 tablet by mouth daily.    Marland Kitchen amoxicillin-clavulanate (AUGMENTIN) 875-125 MG tablet Take 1 tablet by mouth every 12 (twelve) hours. 20 tablet 0  . aspirin EC 81 MG EC tablet Take 1 tablet (81 mg total) by mouth daily. Swallow whole. 30 tablet 11  . atorvastatin (LIPITOR) 40 MG tablet Take 1 tablet (40 mg total) by mouth daily. 90 tablet 3  . budesonide-formoterol (SYMBICORT) 80-4.5 MCG/ACT inhaler Take 2 puffs first thing in am and then another 2 puffs about 12 hours later. (Patient taking differently: Inhale 2 puffs into the lungs in the morning and at bedtime.) 6.9 g 1  . denosumab (PROLIA) 60 MG/ML SOSY injection Inject 60 mg into the skin every 6 (six) months.    . dorzolamide (TRUSOPT) 2 % ophthalmic solution Place 1 drop into both eyes 2 (two) times daily.    Marland Kitchen escitalopram  (LEXAPRO) 20 MG tablet TAKE 1 TABLET BY MOUTH EVERY DAY 90 tablet 0  . folic acid (FOLVITE) 1 MG tablet Take 1 tablet (1 mg total) by mouth daily. 30 tablet 11  . latanoprost (XALATAN) 0.005 % ophthalmic solution Place 1 drop into the right eye at bedtime.    Marland Kitchen levothyroxine (SYNTHROID) 137 MCG tablet TAKE 1 TABLET (137 MCG TOTAL) BY MOUTH DAILY BEFORE BREAKFAST. 90 tablet 3  . methotrexate (RHEUMATREX) 2.5 MG tablet Take by mouth.    . methotrexate (RHEUMATREX) 5 MG tablet Take 5 tablets (25 mg total) by mouth once a week. Caution:Chemotherapy. Protect from light. 4 tablet 0  . Multiple Vitamins-Minerals (MULTIVITAMIN WITH MINERALS) tablet Take 1 tablet by mouth daily.    . NON FORMULARY CPAP nightly    . OXYGEN Inhale 2 L into the lungs daily as needed (During walking and activity).     . potassium citrate (UROCIT-K) 10 MEQ (1080 MG) SR tablet Take 2 tablets (20 mEq total) by mouth 3 (three) times daily with meals. 180 tablet 11  . predniSONE (DELTASONE) 10 MG tablet Take 10 mg by mouth daily.    . predniSONE (DELTASONE) 5 MG tablet Take 3 tablets (15 mg total) by mouth daily with breakfast. 60 tablet 0  . Treprostinil (TYVASO) 0.6 MG/ML SOLN Inhale 0.6 mcg into the  lungs See admin instructions. 12 breaths 4 times a day     No current facility-administered medications for this visit.    Past Medical History:  Diagnosis Date  . Acute respiratory failure with hypoxia ([place])   . Arrhythmia    patient unaware if this is current  . Asthma   . Chronic kidney disease   . Depression   . GERD (gastroesophageal reflux disease)   . Heart murmur   . History of kidney stones   . HOH (hard of hearing)    wear aids  . Hyperthyroidism   . Hypothyroidism   . IBS (irritable bowel syndrome)   . Pneumonia   . Pulmonary hypertension ([place])   . Sarcoid   . Sarcoidosis   . Seasonal allergies   . Sleep apnea CPAP with O2  . Stroke ([place]) 07/2020   [place]  . Wears hearing aid in both ears      Past Surgical History:  Procedure Laterality Date  . ABDOMINAL HYSTERECTOMY     partial  . CARDIAC CATHETERIZATION  10/18/2018   [facility]  . CATARACT EXTRACTION W/PHACO Left 07/31/2019   Procedure: CATARACT EXTRACTION PHACO AND INTRAOCULAR LENS PLACEMENT (IOC) LEFT 00:51.1  17.9%  9.15;  Surgeon: Leandrew Koyanagi, MD;  Location: [facility];  Service: Ophthalmology;  Laterality: Left;  keep this patient second  . COLON SURGERY     "colon was fused to bladder - operated on both"  . COLONOSCOPY  09/18/2007   diverticuli, no polyps  . COLONOSCOPY  05/26/2010   diverticuli, no polyps  . CYSTOSCOPY WITH STENT PLACEMENT Bilateral 07/14/2020   Procedure: CYSTOSCOPY WITH STENT PLACEMENT, RETROPYLOGRAM;  Surgeon: Billey Co, MD;  Location: [facility];  Service: Urology;  Laterality: Bilateral;  . CYSTOSCOPY/URETEROSCOPY/HOLMIUM LASER/STENT PLACEMENT Left 02/20/2020   Procedure: CYSTOSCOPY/URETEROSCOPY/LITHOTRIPSY /STENT PLACEMENT;  Surgeon: Hollice Espy, MD;  Location: [facility];  Service: Urology;  Laterality: Left;  . CYSTOSCOPY/URETEROSCOPY/HOLMIUM LASER/STENT PLACEMENT Bilateral 10/02/2020   Procedure: CYSTOSCOPY/URETEROSCOPY/HOLMIUM LASER/STENT PLACEMENT;  Surgeon: Billey Co, MD;  Location: [facility];  Service: Urology;  Laterality: Bilateral;  . EYE SURGERY    . PARS PLANA VITRECTOMY Right 05/20/2015   Procedure: PARS PLANA VITRECTOMY WITH 25 GAUGE, laser;  Surgeon: Milus Height, MD;  Location: [facility];  Service: Ophthalmology;  Laterality: Right;  . PARTIAL HYSTERECTOMY  1990  . TEE WITHOUT CARDIOVERSION N/A 07/27/2020   Procedure: TRANSESOPHAGEAL ECHOCARDIOGRAM (TEE);  Surgeon: Teodoro Spray, MD;  Location: [facility];  Service: Cardiovascular;  Laterality: N/A;  . TUBAL LIGATION    . WISDOM TOOTH EXTRACTION       Social History   Tobacco Use  . Smoking status: Never Smoker  . Smokeless tobacco: Never Used  Vaping Use  . Vaping Use: Never used  Substance  Use Topics  . Alcohol use: No  . Drug use: No      Family History  Problem Relation Age of Onset  . Allergies Father   . Asthma Father   . Colon cancer Father   . Allergies Brother   . Asthma Brother   . Breast cancer Maternal Grandmother     Allergies  Allergen Reactions  . Nitrofurantoin Nausea Only  . Tramadol     Nausea and vomiting  . Sulfa Antibiotics Rash    As an infant    REVIEW OF SYSTEMS(Negative unless checked)  Constitutional: _0 ??Weight loss _1 ??Fever _2 ??Chills Cardiac: _3 ??Chest pain _4 ??Chest pressure _5 ??Palpitations _6 ??Shortness of breath when laying flat _7 ??Shortness of breath at rest _8 ??Shortness  of breath with exertion. Vascular: _0 ??Pain in legs with walking _1 ??Pain in legs at rest _2 ??Pain in legs when laying flat _3 ??Claudication _4 ??Pain in feet when walking _5 ??Pain in feet at rest _6 ??Pain in feet when laying flat _7 ??History of DVT _8 ??Phlebitis _9 ??Swelling in legs _10 ??Varicose veins _11 ??Non-healing ulcers Pulmonary: _12 ??Uses home oxygen _13 ??Productive cough _14 ??Hemoptysis _15 ??Wheeze _16 ??COPD _17 ??Asthma Neurologic: _18 ??Dizziness _19 ??Blackouts _20 ??Seizures _21 ??History of stroke _22 ??History of TIA _23 ??Aphasia _24 ??Temporary blindness _25 ??Dysphagia _26 ??Weakness or numbness in arms _27 ??Weakness or numbness in legs Musculoskeletal: _28 ??Arthritis _29 ??Joint swelling _30 ??Joint pain _31 ??Low back pain Hematologic: _32 ??Easy bruising _33 ??Easy bleeding _34 ??Hypercoagulable state _35 ??Anemic  Gastrointestinal: _36 ??Blood in stool _37 ??Vomiting blood _38 ??Gastroesophageal reflux/heartburn _39 ??Abdominal pain Genitourinary: _40 ??Chronic kidney disease _41 ??Difficult urination _42 ??Frequent urination _43 ??Burning with urination _44 ??Hematuria Skin: _45 ??Rashes _46 ??Ulcers _47 ??Wounds Psychological: _48 ??History of anxiety _49 ??History of major depression.   Physical  Examination  BP (!) 147/76   Pulse (!) 106   Ht _50  (1.702 m)   Wt 176 lb (79.8 kg)   BMI 27.57 kg/m  Gen:  WD/WN, NAD Head: [place]/AT, No temporalis wasting. Ear/Nose/Throat: Hearing grossly intact, nares w/o erythema or drainage Eyes: Conjunctiva clear. Sclera non-icteric Neck: Supple.  Trachea midline Pulmonary:  Good air movement, no use of accessory muscles on supplemental oxygen.  Cardiac: RRR, no JVD Vascular:  Vessel Right Left  Radial Palpable Palpable                          PT Palpable Palpable  DP Palpable Palpable    Musculoskeletal: M/S 5/5 throughout.  Gangrenous changes to the tip of the right great toe with no erythema or drainage.  This involves roughly 1/4 of the toe.  This is improved over time.  The left foot wound has a second metatarsal head which is exposed and preventing granulation tissue on top of it.  The tissue around it is granulating well.  The size of the wound continues to shrink.  The left great toe is gangrenous about halfway to two thirds of the way down the toe.  There is some granulation just below this and then the skin edges just above the metatarsal head.  The erythema is better than it was at her last visit, and has essentially resolved with antibiotics at this time. Neurologic: Sensation grossly intact in extremities.  Symmetrical.  Speech is fluent.  Psychiatric: Judgment intact, Mood & affect appropriate for pt's clinical situation. Dermatologic: Left foot wound as described above       Labs Recent Results (from the past 2160 hour(s))  SARS CORONAVIRUS 2 (TAT 6-24 HRS) Nasopharyngeal Nasopharyngeal Swab     Status: None   Collection Time: 09/30/20  1:33 PM   Specimen: Nasopharyngeal Swab  Result Value Ref Range   SARS Coronavirus 2 NEGATIVE NEGATIVE    Comment: (NOTE) SARS-CoV-2 target nucleic acids are NOT DETECTED.  The SARS-CoV-2 RNA is generally detectable in upper and lower respiratory specimens during the acute phase  of infection. Negative results do not preclude SARS-CoV-2 infection, do not rule out co-infections with other pathogens, and should not be used as the sole basis for treatment or other patient management decisions. Negative results must be combined with clinical observations, patient history, and epidemiological information. The expected result is Negative.  Fact Sheet for Patients: SugarRoll.be  Fact Sheet for Healthcare Providers: https://www.woods-mathews.com/  This test is not yet approved or cleared by the [place] FDA and  has been authorized for detection and/or diagnosis of SARS-CoV-2 by FDA under an Emergency Use Authorization (  EUA). This EUA will remain  in effect (meaning this test can be used) for the duration of the COVID-19 declaration under Se ction 564(b)(1) of the Act, 21 U.S.C. section 360bbb-3(b)(1), unless the authorization is terminated or revoked sooner.  Performed at [facility], [place] [address] [place], [place] [address]   CBC     Status: Abnormal   Collection Time: 09/30/20  1:47 PM  Result Value Ref Range   WBC 20.7 (H) 4.0 - 10.5 K/uL   RBC 3.69 (L) 3.87 - 5.11 MIL/uL   Hemoglobin 12.1 12.0 - 15.0 g/dL   HCT 36.2 36.0 - 46.0 %   MCV 98.1 80.0 - 100.0 fL   MCH 32.8 26.0 - 34.0 pg   MCHC 33.4 30.0 - 36.0 g/dL   RDW 15.0 11.5 - 15.5 %   Platelets 330 150 - 400 K/uL   nRBC 0.0 0.0 - 0.2 %    Comment: Performed at [facility], [address] [place], [place] [address]  APTT     Status: None   Collection Time: 09/30/20  1:47 PM  Result Value Ref Range   aPTT 30 24 - 36 seconds    Comment: Performed at [facility], [address] [place], [place] [address]  Protime-INR     Status: None   Collection Time: 09/30/20  1:47 PM  Result Value Ref Range   Prothrombin Time 13.7 11.4 - 15.2 seconds   INR 1.1 0.8 - 1.2    Comment: (NOTE) INR goal varies based on device and disease  states. Performed at [facility], [address] [place], [place] [address]   Basic metabolic panel     Status: Abnormal   Collection Time: 09/30/20  1:47 PM  Result Value Ref Range   Sodium 139 135 - 145 mmol/L   Potassium 3.9 3.5 - 5.1 mmol/L   Chloride 99 98 - 111 mmol/L   CO2 28 22 - 32 mmol/L   Glucose, Bld 142 (H) 70 - 99 mg/dL    Comment: Glucose reference range applies only to samples taken after fasting for at least 8 hours.   BUN 29 (H) 8 - 23 mg/dL   Creatinine, Ser 1.09 (H) 0.44 - 1.00 mg/dL   Calcium 9.2 8.9 - 10.3 mg/dL   GFR, Estimated 56 (L) >60 mL/min    Comment: (NOTE) Calculated using the CKD-EPI Creatinine Equation (2021)    Anion gap 12 5 - 15    Comment: Performed at [facility], [address] [place], [place] [address]  Urinalysis, Complete     Status: Abnormal   Collection Time: 10/07/20 10:16 AM  Result Value Ref Range   Specific Gravity, UA 1.020 1.005 - 1.030   pH, UA 6.5 5.0 - 7.5   Color, UA Red (A) Yellow   Appearance Ur Cloudy (A) Clear   Leukocytes,UA 2+ (A) Negative   Protein,UA 3+ (A) Negative/Trace   Glucose, UA Negative Negative   Ketones, UA Negative Negative   RBC, UA 3+ (A) Negative   Bilirubin, UA Negative Negative   Urobilinogen, Ur 0.2 0.2 - 1.0 mg/dL   Nitrite, UA Negative Negative   Microscopic Examination See below:     Comment: Results from sub-optimal specimen performed at the client's request. Interpret result(s) with caution. Microscopic exam performed on unspun urine. Reference range(s) not established for this type of specimen.   Microscopic Examination     Status: Abnormal   Collection Time: 10/07/20 10:16 AM  Urine  Result Value Ref Range   WBC, UA 6-10 (A) 0 - 5 /hpf   RBC >30 (A) 0 - 2 /hpf   Epithelial Cells (non renal) 0-10 0 - 10 /hpf   Casts Present (A) None seen /lpf   Cast Type Hyaline casts N/A   Bacteria, UA Few None seen/Few    Radiology No results  found.  Assessment/Plan CKD (chronic kidney disease) stage 3, GFR 30-59 ml/min (HCC) Avoid or limit contrast as possible.  Hyperlipidemia, mixed lipid control important in reducing the progression of atherosclerotic disease. Continue statin therapy   Pulmonary hypertension ([place]) Fairly significant and on supplemental oxygen. Stable at this time.  Sees her pulmonologist next week.  Would plan to do her surgery with regional anesthesia and sedation and avoid general anesthesia.  Essential hypertension blood pressure control important in reducing the progression of atherosclerotic disease. On appropriate oral medications  Gangrene of left foot (HCC) Gangrenous changes to the tip of the right great toe with no erythema or drainage.  This involves roughly 1/4 of the toe.  This is improved over time.  The left foot wound has a second metatarsal head which is exposed and preventing granulation tissue on top of it.  The tissue around it is granulating well.  The size of the wound continues to shrink.  The left great toe is gangrenous about halfway to two thirds of the way down the toe.  There is some granulation just below this and then the skin edges just above the metatarsal head.  The erythema is better than it was at her last visit, and has essentially resolved with antibiotics at this time. I had a long discussion with the patient and her husband today.  Since she is having some pain, and some recurrent erythema, we feel it is probably time to go ahead and remove that left great toe.  I am hopeful we can do this through an incision well above the metatarsal phalangeal joint and get the toe out through this with some room to close over top of it.  While we are there, it would be prudent to go ahead and remove the second metatarsal head as that is preventing granulation in that area.  Looking at the schedule, we will get this booked in the next couple of weeks at their convenience.  She will  continue her antibiotics for now if she develops recurrent signs of infection they will call our office for a refill.    Leotis Pain, MD  12/29/2020 10:13 AM    This note was created with Dragon medical transcription system.  Any errors from dictation are purely unintentional

## 2020-12-30 DIAGNOSIS — Z9981 Dependence on supplemental oxygen: Secondary | ICD-10-CM | POA: Diagnosis not present

## 2020-12-30 DIAGNOSIS — Z23 Encounter for immunization: Secondary | ICD-10-CM | POA: Diagnosis not present

## 2020-12-30 DIAGNOSIS — R0689 Other abnormalities of breathing: Secondary | ICD-10-CM | POA: Diagnosis not present

## 2020-12-30 DIAGNOSIS — Z7952 Long term (current) use of systemic steroids: Secondary | ICD-10-CM | POA: Diagnosis not present

## 2020-12-30 DIAGNOSIS — I071 Rheumatic tricuspid insufficiency: Secondary | ICD-10-CM | POA: Diagnosis not present

## 2020-12-30 DIAGNOSIS — R0600 Dyspnea, unspecified: Secondary | ICD-10-CM | POA: Diagnosis not present

## 2020-12-30 DIAGNOSIS — Z79899 Other long term (current) drug therapy: Secondary | ICD-10-CM | POA: Diagnosis not present

## 2020-12-30 DIAGNOSIS — I270 Primary pulmonary hypertension: Secondary | ICD-10-CM | POA: Diagnosis not present

## 2020-12-30 DIAGNOSIS — Z8709 Personal history of other diseases of the respiratory system: Secondary | ICD-10-CM | POA: Diagnosis not present

## 2020-12-31 ENCOUNTER — Telehealth (INDEPENDENT_AMBULATORY_CARE_PROVIDER_SITE_OTHER): Payer: Self-pay

## 2020-12-31 DIAGNOSIS — J45909 Unspecified asthma, uncomplicated: Secondary | ICD-10-CM | POA: Diagnosis not present

## 2020-12-31 DIAGNOSIS — N179 Acute kidney failure, unspecified: Secondary | ICD-10-CM | POA: Diagnosis not present

## 2020-12-31 DIAGNOSIS — D62 Acute posthemorrhagic anemia: Secondary | ICD-10-CM | POA: Diagnosis not present

## 2020-12-31 DIAGNOSIS — D696 Thrombocytopenia, unspecified: Secondary | ICD-10-CM | POA: Diagnosis not present

## 2020-12-31 DIAGNOSIS — N189 Chronic kidney disease, unspecified: Secondary | ICD-10-CM | POA: Diagnosis not present

## 2020-12-31 DIAGNOSIS — E039 Hypothyroidism, unspecified: Secondary | ICD-10-CM | POA: Diagnosis not present

## 2020-12-31 DIAGNOSIS — R739 Hyperglycemia, unspecified: Secondary | ICD-10-CM | POA: Diagnosis not present

## 2020-12-31 DIAGNOSIS — I70223 Atherosclerosis of native arteries of extremities with rest pain, bilateral legs: Secondary | ICD-10-CM | POA: Diagnosis not present

## 2020-12-31 DIAGNOSIS — D860 Sarcoidosis of lung: Secondary | ICD-10-CM | POA: Diagnosis not present

## 2020-12-31 DIAGNOSIS — H9190 Unspecified hearing loss, unspecified ear: Secondary | ICD-10-CM | POA: Diagnosis not present

## 2020-12-31 DIAGNOSIS — T380X5D Adverse effect of glucocorticoids and synthetic analogues, subsequent encounter: Secondary | ICD-10-CM | POA: Diagnosis not present

## 2020-12-31 DIAGNOSIS — I1310 Hypertensive heart and chronic kidney disease without heart failure, with stage 1 through stage 4 chronic kidney disease, or unspecified chronic kidney disease: Secondary | ICD-10-CM | POA: Diagnosis not present

## 2020-12-31 DIAGNOSIS — Z4800 Encounter for change or removal of nonsurgical wound dressing: Secondary | ICD-10-CM | POA: Diagnosis not present

## 2020-12-31 DIAGNOSIS — I088 Other rheumatic multiple valve diseases: Secondary | ICD-10-CM | POA: Diagnosis not present

## 2020-12-31 DIAGNOSIS — R339 Retention of urine, unspecified: Secondary | ICD-10-CM | POA: Diagnosis not present

## 2020-12-31 DIAGNOSIS — I272 Pulmonary hypertension, unspecified: Secondary | ICD-10-CM | POA: Diagnosis not present

## 2020-12-31 NOTE — Telephone Encounter (Signed)
Spoke with the patient and she is scheduled with Dr. Lucky Cowboy for a 1st toe amputation and metatarsal head resection on 01/14/21 at the [facility]. Phone call pre-op on 01/07/21 at 9:30 am and covid testing on 01/12/21 at 9:15 am at the [facility]. Pre-surgical instructions were discussed and will be mailed.

## 2021-01-06 ENCOUNTER — Other Ambulatory Visit (INDEPENDENT_AMBULATORY_CARE_PROVIDER_SITE_OTHER): Payer: Self-pay | Admitting: Nurse Practitioner

## 2021-01-06 ENCOUNTER — Ambulatory Visit: Payer: BC Managed Care – PPO

## 2021-01-07 ENCOUNTER — Encounter
Admission: RE | Admit: 2021-01-07 | Discharge: 2021-01-07 | Disposition: A | Discharge status: Home / Self Care | Payer: BC Managed Care – PPO | Source: Ambulatory Visit | Attending: Vascular Surgery | Admitting: Vascular Surgery

## 2021-01-07 ENCOUNTER — Other Ambulatory Visit: Payer: Self-pay

## 2021-01-07 DIAGNOSIS — Z01818 Encounter for other preprocedural examination: Secondary | ICD-10-CM | POA: Diagnosis not present

## 2021-01-07 NOTE — Patient Instructions (Addendum)
Your procedure is scheduled on: 01/14/21 - Thursday Report to the Registration Desk on the 1st floor of the [facility]. To find out your arrival time, please call [phone] between 1PM - 3PM on: Wednesday 01/13/21  REMEMBER: Instructions that are not followed completely may result in serious medical risk, up to and including death; or upon the discretion of your surgeon and anesthesiologist your surgery may need to be rescheduled.  Do not eat food or drink any fluids after midnight the night before surgery.  No gum chewing, lozengers or hard candies.   TAKE THESE MEDICATIONS THE MORNING OF SURGERY WITH A SIP OF WATER:  - atorvastatin (LIPITOR) 40 MG tablet - budesonide-formoterol (SYMBICORT) 80-4.5 MCG/ACT inhaler - dorzolamide (TRUSOPT) 2 % ophthalmic solution - escitalopram (LEXAPRO) 20 MG tablet - levothyroxine (SYNTHROID) 137 MCG tablet - Treprostinil (TYVASO) 0.6 MG/ML SOln  Use inhalers on the day of surgery and bring to the hospital.  One week prior to surgery: Stop Anti-inflammatories (NSAIDS) such as Advil, Aleve, Ibuprofen, Motrin, Naproxen, Naprosyn and Aspirin based products such as Excedrin, Goodys Powder, BC Powder.  Stop ANY OVER THE COUNTER supplements until after surgery.  No Alcohol for 24 hours before or after surgery.  No Smoking including e-cigarettes for 24 hours prior to surgery.  No chewable tobacco products for at least 6 hours prior to surgery.  No nicotine patches on the day of surgery.  Do not use any "recreational" drugs for at least a week prior to your surgery.  Please be advised that the combination of cocaine and anesthesia may have negative outcomes, up to and including death. If you test positive for cocaine, your surgery will be cancelled.  On the morning of surgery brush your teeth with toothpaste and water, you may rinse your mouth with mouthwash if you wish. Do not swallow any toothpaste or mouthwash.  Do not wear jewelry, make-up,  hairpins, clips or nail polish.  Do not wear lotions, powders, or perfumes.   Do not shave body from the neck down 48 hours prior to surgery just in case you cut yourself which could leave a site for infection.  Also, freshly shaved skin may become irritated if using the CHG soap.  Contact lenses, hearing aids and dentures may not be worn into surgery.  Do not bring valuables to the hospital. [facility] is not responsible for any missing/lost belongings or valuables.   Use CHG Soap or wipes as directed on instruction sheet.  Bring your C-PAP to the hospital with you in case you may have to spend the night.   Notify your doctor if there is any change in your medical condition (cold, fever, infection).  Wear comfortable clothing (specific to your surgery type) to the hospital.  Plan for stool softeners for home use; pain medications have a tendency to cause constipation. You can also help prevent constipation by eating foods high in fiber such as fruits and vegetables and drinking plenty of fluids as your diet allows.  After surgery, you can help prevent lung complications by doing breathing exercises.  Take deep breaths and cough every 1-2 hours. Your doctor may order a device called an Incentive Spirometer to help you take deep breaths. When coughing or sneezing, hold a pillow firmly against your incision with both hands. This is called "splinting." Doing this helps protect your incision. It also decreases belly discomfort.  If you are being admitted to the hospital overnight, leave your suitcase in the car. After surgery it may  be brought to your room.  If you are being discharged the day of surgery, you will not be allowed to drive home. You will need a responsible adult (18 years or older) to drive you home and stay with you that night.   If you are taking public transportation, you will need to have a responsible adult (18 years or older) with you. Please confirm with your  physician that it is acceptable to use public transportation.   Please call the [facility] at [phone] if you have any questions about these instructions.  Surgery Visitation Policy:  Patients undergoing a surgery or procedure may have one family member or support person with them as long as that person is not COVID-19 positive or experiencing its symptoms.  That person may remain in the waiting area during the procedure.  Inpatient Visitation:    Visiting hours are 7 a.m. to 8 p.m. Inpatients will be allowed two visitors daily. The visitors may change each day during the patient's stay. No visitors under the age of 60. Any visitor under the age of 31 must be accompanied by an adult. The visitor must pass COVID-19 screenings, use hand sanitizer when entering and exiting the patient's room and wear a mask at all times, including in the patient's room. Patients must also wear a mask when staff or their visitor are in the room. Masking is required regardless of vaccination status.

## 2021-01-12 ENCOUNTER — Other Ambulatory Visit: Payer: Self-pay

## 2021-01-12 ENCOUNTER — Other Ambulatory Visit
Admission: RE | Admit: 2021-01-12 | Discharge: 2021-01-12 | Disposition: A | Discharge status: Home / Self Care | Payer: BC Managed Care – PPO | Source: Ambulatory Visit | Attending: Vascular Surgery | Admitting: Vascular Surgery

## 2021-01-12 DIAGNOSIS — I129 Hypertensive chronic kidney disease with stage 1 through stage 4 chronic kidney disease, or unspecified chronic kidney disease: Secondary | ICD-10-CM | POA: Diagnosis not present

## 2021-01-12 DIAGNOSIS — Z7982 Long term (current) use of aspirin: Secondary | ICD-10-CM | POA: Diagnosis not present

## 2021-01-12 DIAGNOSIS — Z79899 Other long term (current) drug therapy: Secondary | ICD-10-CM | POA: Diagnosis not present

## 2021-01-12 DIAGNOSIS — Z7951 Long term (current) use of inhaled steroids: Secondary | ICD-10-CM | POA: Diagnosis not present

## 2021-01-12 DIAGNOSIS — Z882 Allergy status to sulfonamides status: Secondary | ICD-10-CM | POA: Diagnosis not present

## 2021-01-12 DIAGNOSIS — Z8673 Personal history of transient ischemic attack (TIA), and cerebral infarction without residual deficits: Secondary | ICD-10-CM | POA: Diagnosis not present

## 2021-01-12 DIAGNOSIS — I6340 Cerebral infarction due to embolism of unspecified cerebral artery: Secondary | ICD-10-CM | POA: Diagnosis not present

## 2021-01-12 DIAGNOSIS — I96 Gangrene, not elsewhere classified: Secondary | ICD-10-CM | POA: Diagnosis present

## 2021-01-12 DIAGNOSIS — I272 Pulmonary hypertension, unspecified: Secondary | ICD-10-CM | POA: Diagnosis not present

## 2021-01-12 DIAGNOSIS — M86172 Other acute osteomyelitis, left ankle and foot: Secondary | ICD-10-CM | POA: Diagnosis not present

## 2021-01-12 DIAGNOSIS — Z79890 Hormone replacement therapy (postmenopausal): Secondary | ICD-10-CM | POA: Diagnosis not present

## 2021-01-12 DIAGNOSIS — Z9981 Dependence on supplemental oxygen: Secondary | ICD-10-CM | POA: Diagnosis not present

## 2021-01-12 DIAGNOSIS — H9190 Unspecified hearing loss, unspecified ear: Secondary | ICD-10-CM | POA: Diagnosis not present

## 2021-01-12 DIAGNOSIS — E782 Mixed hyperlipidemia: Secondary | ICD-10-CM | POA: Diagnosis not present

## 2021-01-12 DIAGNOSIS — Z01818 Encounter for other preprocedural examination: Secondary | ICD-10-CM | POA: Insufficient documentation

## 2021-01-12 DIAGNOSIS — Z888 Allergy status to other drugs, medicaments and biological substances status: Secondary | ICD-10-CM | POA: Diagnosis not present

## 2021-01-12 DIAGNOSIS — Z90711 Acquired absence of uterus with remaining cervical stump: Secondary | ICD-10-CM | POA: Diagnosis not present

## 2021-01-12 DIAGNOSIS — N183 Chronic kidney disease, stage 3 unspecified: Secondary | ICD-10-CM | POA: Diagnosis not present

## 2021-01-12 DIAGNOSIS — Z20822 Contact with and (suspected) exposure to covid-19: Secondary | ICD-10-CM | POA: Insufficient documentation

## 2021-01-12 LAB — CBC WITH DIFFERENTIAL/PLATELET
Abs Immature Granulocytes: 0.22 10*3/uL — ABNORMAL HIGH (ref 0.00–0.07)
Basophils Absolute: 0.1 10*3/uL (ref 0.0–0.1)
Basophils Relative: 0 %
Eosinophils Absolute: 0.2 10*3/uL (ref 0.0–0.5)
Eosinophils Relative: 1 %
HCT: 37.2 % (ref 36.0–46.0)
Hemoglobin: 11.9 g/dL — ABNORMAL LOW (ref 12.0–15.0)
Immature Granulocytes: 1 %
Lymphocytes Relative: 4 %
Lymphs Abs: 0.6 10*3/uL — ABNORMAL LOW (ref 0.7–4.0)
MCH: 29.9 pg (ref 26.0–34.0)
MCHC: 32 g/dL (ref 30.0–36.0)
MCV: 93.5 fL (ref 80.0–100.0)
Monocytes Absolute: 0.7 10*3/uL (ref 0.1–1.0)
Monocytes Relative: 4 %
Neutro Abs: 15.7 10*3/uL — ABNORMAL HIGH (ref 1.7–7.7)
Neutrophils Relative %: 90 %
Platelets: 325 10*3/uL (ref 150–400)
RBC: 3.98 MIL/uL (ref 3.87–5.11)
RDW: 15.4 % (ref 11.5–15.5)
WBC: 17.4 10*3/uL — ABNORMAL HIGH (ref 4.0–10.5)
nRBC: 0 % (ref 0.0–0.2)

## 2021-01-12 LAB — BASIC METABOLIC PANEL
Anion gap: 9 (ref 5–15)
BUN: 27 mg/dL — ABNORMAL HIGH (ref 8–23)
CO2: 31 mmol/L (ref 22–32)
Calcium: 9.3 mg/dL (ref 8.9–10.3)
Chloride: 102 mmol/L (ref 98–111)
Creatinine, Ser: 1.15 mg/dL — ABNORMAL HIGH (ref 0.44–1.00)
GFR, Estimated: 53 mL/min — ABNORMAL LOW (ref >60)
Glucose, Bld: 125 mg/dL — ABNORMAL HIGH (ref 70–99)
Potassium: 3.5 mmol/L (ref 3.5–5.1)
Sodium: 142 mmol/L (ref 135–145)

## 2021-01-12 LAB — APTT: aPTT: 31 seconds (ref 24–36)

## 2021-01-12 LAB — TYPE AND SCREEN
ABO/RH(D): O POS
Antibody Screen: NEGATIVE

## 2021-01-12 LAB — PROTIME-INR
INR: 1 (ref 0.8–1.2)
Prothrombin Time: 12.9 seconds (ref 11.4–15.2)

## 2021-01-12 NOTE — Progress Notes (Signed)
Perioperative Services Pre-Admission/Anesthesia Testing     Date: 01/12/21  Name: Stacie [name] MRN:   [id number]  Re: Review and clearance for surgery   Case: [id number] Date/Time: 01/14/21 1230   Procedure: AMPUTATION RAY (1ST TOE ) ( 2ND METATARSAL HEAD RESECTION) (Left )   Anesthesia type: General   Pre-op diagnosis: GANGRENE LEFT FOOT   Location: [facility]   Surgeons: Algernon Huxley, MD    Patient is scheduled for the above procedure on 01/14/2021 with Dr. Leotis Pain.  In review of her EMR, patient was reviewed by PAT APP back in 09/2020 prior to urologic procedure; see notes.  No significant changes to patient's medical history since that time.  Repeat PAT work-up as follows:  Hospital Outpatient Visit on 01/12/2021  Component Date Value Ref Range Status  . WBC 01/12/2021 17.4* 4.0 - 10.5 K/uL Final  . RBC 01/12/2021 3.98  3.87 - 5.11 MIL/uL Final  . Hemoglobin 01/12/2021 11.9* 12.0 - 15.0 g/dL Final  . HCT 01/12/2021 37.2  36.0 - 46.0 % Final  . MCV 01/12/2021 93.5  80.0 - 100.0 fL Final  . MCH 01/12/2021 29.9  26.0 - 34.0 pg Final  . MCHC 01/12/2021 32.0  30.0 - 36.0 g/dL Final  . RDW 01/12/2021 15.4  11.5 - 15.5 % Final  . Platelets 01/12/2021 325  150 - 400 K/uL Final  . nRBC 01/12/2021 0.0  0.0 - 0.2 % Final  . Neutrophils Relative % 01/12/2021 90  % Final  . Neutro Abs 01/12/2021 15.7* 1.7 - 7.7 K/uL Final  . Lymphocytes Relative 01/12/2021 4  % Final  . Lymphs Abs 01/12/2021 0.6* 0.7 - 4.0 K/uL Final  . Monocytes Relative 01/12/2021 4  % Final  . Monocytes Absolute 01/12/2021 0.7  0.1 - 1.0 K/uL Final  . Eosinophils Relative 01/12/2021 1  % Final  . Eosinophils Absolute 01/12/2021 0.2  0.0 - 0.5 K/uL Final  . Basophils Relative 01/12/2021 0  % Final  . Basophils Absolute 01/12/2021 0.1  0.0 - 0.1 K/uL Final  . Immature Granulocytes 01/12/2021 1  % Final  . Abs Immature Granulocytes 01/12/2021 0.22* 0.00 - 0.07 K/uL Final    Performed at [facility], [address], [place], [place] [address]  . Sodium 01/12/2021 142  135 - 145 mmol/L Final  . Potassium 01/12/2021 3.5  3.5 - 5.1 mmol/L Final  . Chloride 01/12/2021 102  98 - 111 mmol/L Final  . CO2 01/12/2021 31  22 - 32 mmol/L Final  . Glucose, Bld 01/12/2021 125* 70 - 99 mg/dL Final   Glucose reference range applies only to samples taken after fasting for at least 8 hours.  . BUN 01/12/2021 27* 8 - 23 mg/dL Final  . Creatinine, Ser 01/12/2021 1.15* 0.44 - 1.00 mg/dL Final  . Calcium 01/12/2021 9.3  8.9 - 10.3 mg/dL Final  . GFR, Estimated 01/12/2021 53* >60 mL/min Final   Comment: (NOTE) Calculated using the CKD-EPI Creatinine Equation (2021)   . Anion gap 01/12/2021 9  5 - 15 Final   Performed at [facility], [address] [place], [place] [address]  . Prothrombin Time 01/12/2021 12.9  11.4 - 15.2 seconds Final  . INR 01/12/2021 1.0  0.8 - 1.2 Final   Comment: (NOTE) INR goal varies based on device and disease states. Performed at [facility], [address] [place], [place] [address]   . aPTT 01/12/2021 31  24 -  36 seconds Final   Performed at [facility], [place], [place] [address]    ECG: Date: 003/29/2022 Time ECG obtained: 1124 AM Rate: 89 bpm Rhythm: NSR Axis (leads I and aVF): Normal Intervals: PR 126 ms. QRS 82 ms. QTc 450 ms. ST segment and T wave changes: No evidence of acute ST segment elevation or depression  INTERVAL TESTING: ECHOCARDIOGRAM performed on 12/30/2020 1. LVEF >55% 2. Normal left ventricular systolic function 3. Normal LA pressures with diastolic dysfunction 4. Normal right ventricular systolic function 5. Trivial MR and PR 6. Moderate TR 7. No AR 8. No valvular stenosis 9. No evidence of pericardial effusion  Impression and Plan:  Stacie [name] has been referred for pre-anesthesia review and clearance prior to her undergoing the planned  anesthetic and procedural courses. Available labs, pertinent testing, and imaging results were personally reviewed by me.  Updated clearance to proceed obtained from internal medicine Army Melia, MD) advised the patient able to proceed with planned surgical intervention at a LOW risk for significant perioperative complications.  Patient will need to hold her daily low-dose ASA for 5 days prior to her procedure per PCP.  based on clinical review performed today (10/01/20), barring any significant acute changes in the patient's overall condition, it is anticipated that she will be able to proceed with the planned surgical intervention. This patient has been appropriately cleared by internal medicine with a LOW risk stratification. Any acute changes in clinical condition may necessitate her procedure being postponed and/or cancelled. Pre-surgical instructions were reviewed with the patient during her PAT appointment and questions were fielded by PAT clinical staff.  Honor Loh, MSN, APRN, FNP-C, CEN [place]  Peri-operative Services Nurse Practitioner Phone: [phone] 01/12/21 1:10 PM

## 2021-01-13 ENCOUNTER — Ambulatory Visit: Payer: BC Managed Care – PPO

## 2021-01-13 DIAGNOSIS — R739 Hyperglycemia, unspecified: Secondary | ICD-10-CM | POA: Diagnosis not present

## 2021-01-13 DIAGNOSIS — E039 Hypothyroidism, unspecified: Secondary | ICD-10-CM | POA: Diagnosis not present

## 2021-01-13 DIAGNOSIS — N189 Chronic kidney disease, unspecified: Secondary | ICD-10-CM | POA: Diagnosis not present

## 2021-01-13 DIAGNOSIS — D62 Acute posthemorrhagic anemia: Secondary | ICD-10-CM | POA: Diagnosis not present

## 2021-01-13 DIAGNOSIS — I272 Pulmonary hypertension, unspecified: Secondary | ICD-10-CM | POA: Diagnosis not present

## 2021-01-13 DIAGNOSIS — T380X5D Adverse effect of glucocorticoids and synthetic analogues, subsequent encounter: Secondary | ICD-10-CM | POA: Diagnosis not present

## 2021-01-13 DIAGNOSIS — I70223 Atherosclerosis of native arteries of extremities with rest pain, bilateral legs: Secondary | ICD-10-CM | POA: Diagnosis not present

## 2021-01-13 DIAGNOSIS — J45909 Unspecified asthma, uncomplicated: Secondary | ICD-10-CM | POA: Diagnosis not present

## 2021-01-13 DIAGNOSIS — Z4800 Encounter for change or removal of nonsurgical wound dressing: Secondary | ICD-10-CM | POA: Diagnosis not present

## 2021-01-13 DIAGNOSIS — R339 Retention of urine, unspecified: Secondary | ICD-10-CM | POA: Diagnosis not present

## 2021-01-13 DIAGNOSIS — I088 Other rheumatic multiple valve diseases: Secondary | ICD-10-CM | POA: Diagnosis not present

## 2021-01-13 DIAGNOSIS — D696 Thrombocytopenia, unspecified: Secondary | ICD-10-CM | POA: Diagnosis not present

## 2021-01-13 DIAGNOSIS — H9190 Unspecified hearing loss, unspecified ear: Secondary | ICD-10-CM | POA: Diagnosis not present

## 2021-01-13 DIAGNOSIS — N179 Acute kidney failure, unspecified: Secondary | ICD-10-CM | POA: Diagnosis not present

## 2021-01-13 DIAGNOSIS — I1310 Hypertensive heart and chronic kidney disease without heart failure, with stage 1 through stage 4 chronic kidney disease, or unspecified chronic kidney disease: Secondary | ICD-10-CM | POA: Diagnosis not present

## 2021-01-13 DIAGNOSIS — D860 Sarcoidosis of lung: Secondary | ICD-10-CM | POA: Diagnosis not present

## 2021-01-13 LAB — SARS CORONAVIRUS 2 (TAT 6-24 HRS): SARS Coronavirus 2: NEGATIVE

## 2021-01-13 MED ORDER — CEFAZOLIN SODIUM-DEXTROSE 2-4 GM/100ML-% IV SOLN
2.0000 g | INTRAVENOUS | Status: AC
  Administered 2021-01-14: 2 g via INTRAVENOUS

## 2021-01-13 MED ORDER — FAMOTIDINE 20 MG PO TABS
20.0000 mg | ORAL_TABLET | Freq: Once | ORAL | Status: AC
  Administered 2021-01-14: 20 mg via ORAL

## 2021-01-13 MED ORDER — CHLORHEXIDINE GLUCONATE 0.12 % MT SOLN
15.0000 mL | Freq: Once | OROMUCOSAL | Status: AC
  Administered 2021-01-14: 15 mL via OROMUCOSAL

## 2021-01-13 MED ORDER — LACTATED RINGERS IV SOLN: INTRAVENOUS | Status: DC

## 2021-01-13 MED ORDER — CHLORHEXIDINE GLUCONATE CLOTH 2 % EX PADS
6.0000 | MEDICATED_PAD | Freq: Once | CUTANEOUS | Status: AC
  Administered 2021-01-14: 6 via TOPICAL

## 2021-01-13 MED ORDER — ORAL CARE MOUTH RINSE: 15.0000 mL | Freq: Once | OROMUCOSAL | Status: AC

## 2021-01-14 ENCOUNTER — Other Ambulatory Visit: Payer: Self-pay

## 2021-01-14 ENCOUNTER — Encounter
Admission: RE | Disposition: A | Discharge status: Home / Self Care | Payer: Self-pay | Source: Ambulatory Visit | Attending: Vascular Surgery

## 2021-01-14 ENCOUNTER — Ambulatory Visit
Admission: RE | Admit: 2021-01-14 | Discharge: 2021-01-14 | Disposition: A | Discharge status: Home / Self Care | Payer: BC Managed Care – PPO | Source: Ambulatory Visit | Attending: Vascular Surgery | Admitting: Vascular Surgery

## 2021-01-14 ENCOUNTER — Ambulatory Visit: Payer: BC Managed Care – PPO | Admitting: Urgent Care

## 2021-01-14 ENCOUNTER — Encounter: Payer: Self-pay | Admitting: Vascular Surgery

## 2021-01-14 DIAGNOSIS — Z79899 Other long term (current) drug therapy: Secondary | ICD-10-CM | POA: Diagnosis not present

## 2021-01-14 DIAGNOSIS — Z9981 Dependence on supplemental oxygen: Secondary | ICD-10-CM | POA: Insufficient documentation

## 2021-01-14 DIAGNOSIS — Z882 Allergy status to sulfonamides status: Secondary | ICD-10-CM | POA: Diagnosis not present

## 2021-01-14 DIAGNOSIS — E782 Mixed hyperlipidemia: Secondary | ICD-10-CM | POA: Diagnosis not present

## 2021-01-14 DIAGNOSIS — Z7982 Long term (current) use of aspirin: Secondary | ICD-10-CM | POA: Diagnosis not present

## 2021-01-14 DIAGNOSIS — I272 Pulmonary hypertension, unspecified: Secondary | ICD-10-CM | POA: Insufficient documentation

## 2021-01-14 DIAGNOSIS — Z7951 Long term (current) use of inhaled steroids: Secondary | ICD-10-CM | POA: Diagnosis not present

## 2021-01-14 DIAGNOSIS — M86172 Other acute osteomyelitis, left ankle and foot: Secondary | ICD-10-CM | POA: Diagnosis not present

## 2021-01-14 DIAGNOSIS — Z8673 Personal history of transient ischemic attack (TIA), and cerebral infarction without residual deficits: Secondary | ICD-10-CM | POA: Diagnosis not present

## 2021-01-14 DIAGNOSIS — I129 Hypertensive chronic kidney disease with stage 1 through stage 4 chronic kidney disease, or unspecified chronic kidney disease: Secondary | ICD-10-CM | POA: Insufficient documentation

## 2021-01-14 DIAGNOSIS — Z79890 Hormone replacement therapy (postmenopausal): Secondary | ICD-10-CM | POA: Diagnosis not present

## 2021-01-14 DIAGNOSIS — Z90711 Acquired absence of uterus with remaining cervical stump: Secondary | ICD-10-CM | POA: Insufficient documentation

## 2021-01-14 DIAGNOSIS — Z01810 Encounter for preprocedural cardiovascular examination: Secondary | ICD-10-CM | POA: Diagnosis not present

## 2021-01-14 DIAGNOSIS — Z20822 Contact with and (suspected) exposure to covid-19: Secondary | ICD-10-CM | POA: Insufficient documentation

## 2021-01-14 DIAGNOSIS — N183 Chronic kidney disease, stage 3 unspecified: Secondary | ICD-10-CM | POA: Insufficient documentation

## 2021-01-14 DIAGNOSIS — Z888 Allergy status to other drugs, medicaments and biological substances status: Secondary | ICD-10-CM | POA: Diagnosis not present

## 2021-01-14 DIAGNOSIS — I96 Gangrene, not elsewhere classified: Secondary | ICD-10-CM | POA: Diagnosis not present

## 2021-01-14 DIAGNOSIS — H9190 Unspecified hearing loss, unspecified ear: Secondary | ICD-10-CM | POA: Insufficient documentation

## 2021-01-14 HISTORY — PX: AMPUTATION: SHX166

## 2021-01-14 LAB — POCT I-STAT, CHEM 8
BUN: 25 mg/dL — ABNORMAL HIGH (ref 8–23)
Calcium, Ion: 1.18 mmol/L (ref 1.15–1.40)
Chloride: 99 mmol/L (ref 98–111)
Creatinine, Ser: 1.1 mg/dL — ABNORMAL HIGH (ref 0.44–1.00)
Glucose, Bld: 98 mg/dL (ref 70–99)
HCT: 36 % (ref 36.0–46.0)
Hemoglobin: 12.2 g/dL (ref 12.0–15.0)
Potassium: 3.3 mmol/L — ABNORMAL LOW (ref 3.5–5.1)
Sodium: 143 mmol/L (ref 135–145)
TCO2: 30 mmol/L (ref 22–32)

## 2021-01-14 LAB — ABO/RH: ABO/RH(D): O POS

## 2021-01-14 SURGERY — AMPUTATION, FOOT, RAY
Anesthesia: General | Laterality: Left

## 2021-01-14 MED ORDER — ONDANSETRON HCL 4 MG/2ML IJ SOLN
INTRAMUSCULAR | Status: AC
  Filled 2021-01-14: qty 2

## 2021-01-14 MED ORDER — CHLORHEXIDINE GLUCONATE 0.12 % MT SOLN
OROMUCOSAL | Status: AC
  Filled 2021-01-14: qty 15

## 2021-01-14 MED ORDER — PHENYLEPHRINE HCL (PRESSORS) 10 MG/ML IV SOLN
INTRAVENOUS | Status: DC | PRN
  Administered 2021-01-14 (×5): 50 ug via INTRAVENOUS
  Administered 2021-01-14: 100 ug via INTRAVENOUS
  Administered 2021-01-14: 50 ug via INTRAVENOUS

## 2021-01-14 MED ORDER — DROPERIDOL 2.5 MG/ML IJ SOLN
0.6250 mg | Freq: Once | INTRAMUSCULAR | Status: DC | PRN
  Filled 2021-01-14: qty 2

## 2021-01-14 MED ORDER — ACETAMINOPHEN 160 MG/5ML PO SOLN
325.0000 mg | ORAL | Status: DC | PRN
  Filled 2021-01-14: qty 20.3

## 2021-01-14 MED ORDER — PROMETHAZINE HCL 25 MG/ML IJ SOLN: 6.2500 mg | INTRAMUSCULAR | Status: DC | PRN

## 2021-01-14 MED ORDER — PROPOFOL 10 MG/ML IV BOLUS
INTRAVENOUS | Status: DC | PRN
  Administered 2021-01-14: 80 mg via INTRAVENOUS

## 2021-01-14 MED ORDER — ONDANSETRON HCL 4 MG/2ML IJ SOLN
INTRAMUSCULAR | Status: DC | PRN
  Administered 2021-01-14: 4 mg via INTRAVENOUS

## 2021-01-14 MED ORDER — PROPOFOL 500 MG/50ML IV EMUL
INTRAVENOUS | Status: DC | PRN
  Administered 2021-01-14: 120 ug/kg/min via INTRAVENOUS

## 2021-01-14 MED ORDER — BUPIVACAINE HCL 0.5 % IJ SOLN
INTRAMUSCULAR | Status: DC | PRN
  Administered 2021-01-14: 30 mL

## 2021-01-14 MED ORDER — LIDOCAINE HCL (PF) 2 % IJ SOLN
INTRAMUSCULAR | Status: AC
  Filled 2021-01-14: qty 5

## 2021-01-14 MED ORDER — CEFAZOLIN SODIUM-DEXTROSE 2-4 GM/100ML-% IV SOLN
INTRAVENOUS | Status: AC
  Filled 2021-01-14: qty 100

## 2021-01-14 MED ORDER — PROPOFOL 500 MG/50ML IV EMUL
INTRAVENOUS | Status: AC
  Filled 2021-01-14: qty 50

## 2021-01-14 MED ORDER — FENTANYL CITRATE (PF) 100 MCG/2ML IJ SOLN: 25.0000 ug | INTRAMUSCULAR | Status: DC | PRN

## 2021-01-14 MED ORDER — DEXMEDETOMIDINE (PRECEDEX) IN NS 20 MCG/5ML (4 MCG/ML) IV SYRINGE
PREFILLED_SYRINGE | INTRAVENOUS | Status: DC | PRN
  Administered 2021-01-14: 12 ug via INTRAVENOUS
  Administered 2021-01-14 (×2): 4 ug via INTRAVENOUS

## 2021-01-14 MED ORDER — ACETAMINOPHEN 325 MG PO TABS: 325.0000 mg | ORAL_TABLET | ORAL | Status: DC | PRN

## 2021-01-14 MED ORDER — DEXMEDETOMIDINE (PRECEDEX) IN NS 20 MCG/5ML (4 MCG/ML) IV SYRINGE
PREFILLED_SYRINGE | INTRAVENOUS | Status: AC
  Filled 2021-01-14: qty 5

## 2021-01-14 MED ORDER — HYDROCODONE-ACETAMINOPHEN 5-325 MG PO TABS: 1.0000 | ORAL_TABLET | Freq: Four times a day (QID) | ORAL | 0 refills | Status: DC | PRN

## 2021-01-14 MED ORDER — FAMOTIDINE 20 MG PO TABS
ORAL_TABLET | ORAL | Status: AC
  Filled 2021-01-14: qty 1

## 2021-01-14 SURGICAL SUPPLY — 40 items
BLADE OSCILLATING/SAGITTAL (BLADE) ×1
BLADE SW THK.38XMED LNG THN (BLADE) ×1 IMPLANT
BNDG COHESIVE 4X5 TAN STRL (GAUZE/BANDAGES/DRESSINGS) ×2 IMPLANT
BNDG ELASTIC 6X5.8 VLCR NS LF (GAUZE/BANDAGES/DRESSINGS) ×2 IMPLANT
BNDG GAUZE 4.5X4.1 6PLY STRL (MISCELLANEOUS) ×4 IMPLANT
BRUSH SCRUB EZ  4% CHG (MISCELLANEOUS) ×1
BRUSH SCRUB EZ 4% CHG (MISCELLANEOUS) ×1 IMPLANT
CHLORAPREP W/TINT 26 (MISCELLANEOUS) ×2 IMPLANT
COVER WAND RF STERILE (DRAPES) ×2 IMPLANT
ELECT CAUTERY BLADE 6.4 (BLADE) ×2 IMPLANT
ELECT REM PT RETURN 9FT ADLT (ELECTROSURGICAL) ×2
ELECTRODE REM PT RTRN 9FT ADLT (ELECTROSURGICAL) ×1 IMPLANT
GAUZE XEROFORM 1X8 LF (GAUZE/BANDAGES/DRESSINGS) ×4 IMPLANT
GLOVE SURG ENC MOIS LTX SZ7 (GLOVE) ×6 IMPLANT
GLOVE SURG SYN 7.0 (GLOVE) ×2 IMPLANT
GLOVE SURG UNDER LTX SZ7.5 (GLOVE) ×6 IMPLANT
GOWN STRL REUS W/ TWL LRG LVL3 (GOWN DISPOSABLE) ×1 IMPLANT
GOWN STRL REUS W/ TWL XL LVL3 (GOWN DISPOSABLE) ×2 IMPLANT
GOWN STRL REUS W/TWL LRG LVL3 (GOWN DISPOSABLE) ×1
GOWN STRL REUS W/TWL XL LVL3 (GOWN DISPOSABLE) ×2
HANDLE YANKAUER SUCT BULB TIP (MISCELLANEOUS) ×2 IMPLANT
KIT TURNOVER KIT A (KITS) ×2 IMPLANT
LABEL OR SOLS (LABEL) IMPLANT
MANIFOLD NEPTUNE II (INSTRUMENTS) ×2 IMPLANT
NS IRRIG 1000ML POUR BTL (IV SOLUTION) ×2 IMPLANT
PACK EXTREMITY ARMC (MISCELLANEOUS) ×2 IMPLANT
PAD ABD DERMACEA PRESS 5X9 (GAUZE/BANDAGES/DRESSINGS) ×4 IMPLANT
PAD PREP 24X41 OB/GYN DISP (PERSONAL CARE ITEMS) ×2 IMPLANT
SPONGE LAP 18X18 RF (DISPOSABLE) ×2 IMPLANT
STAPLER SKIN PROX 35W (STAPLE) IMPLANT
STOCKINETTE M/LG 89821 (MISCELLANEOUS) ×2 IMPLANT
SUT ETHILON 3-0 FS-10 30 BLK (SUTURE) ×4
SUT SILK 2 0 (SUTURE) ×1
SUT SILK 2 0 SH (SUTURE) IMPLANT
SUT SILK 2-0 18XBRD TIE 12 (SUTURE) ×1 IMPLANT
SUT SILK 3 0 (SUTURE) ×1
SUT SILK 3-0 18XBRD TIE 12 (SUTURE) ×1 IMPLANT
SUT VIC AB 0 CT1 36 (SUTURE) ×4 IMPLANT
SUT VIC AB 2-0 CT1 (SUTURE) ×4 IMPLANT
SUTURE EHLN 3-0 FS-10 30 BLK (SUTURE) ×2 IMPLANT

## 2021-01-14 NOTE — Transfer of Care (Signed)
Immediate Anesthesia Transfer of Care Note  Patient: Stacie [name]  Procedure(s) Performed: AMPUTATION RAY (1ST TOE ) ( 2ND METATARSAL HEAD RESECTION) (Left )  Patient Location: PACU  Anesthesia Type:General  Level of Consciousness: drowsy and patient cooperative  Airway & Oxygen Therapy: Patient Spontanous Breathing and Patient connected to nasal cannula oxygen  Post-op Assessment: Report given to RN and Post -op Vital signs reviewed and stable  Post vital signs: Reviewed and stable  Last Vitals:  Vitals Value Taken Time  BP 98/63 01/14/21 1335  Temp    Pulse 79 01/14/21 1335  Resp 20 01/14/21 1335  SpO2 99 % 01/14/21 1335  Vitals shown include unvalidated device data.  Last Pain:  Vitals:   01/14/21 1129  TempSrc: Temporal  PainSc: 0-No pain         Complications: No complications documented.

## 2021-01-14 NOTE — Interval H&P Note (Signed)
History and Physical Interval Note:  01/14/2021 11:57 AM  Stacie [name]  has presented today for surgery, with the diagnosis of GANGRENE LEFT FOOT.  The various methods of treatment have been discussed with the patient and family. After consideration of risks, benefits and other options for treatment, the patient has consented to  Procedure(s): AMPUTATION RAY (1ST TOE ) ( 2ND METATARSAL HEAD RESECTION) (Left) as a surgical intervention.  The patient's history has been reviewed, patient examined, no change in status, stable for surgery.  I have reviewed the patient's chart and labs.  Questions were answered to the patient's satisfaction.     Leotis Pain

## 2021-01-14 NOTE — Anesthesia Preprocedure Evaluation (Addendum)
Anesthesia Evaluation  Patient identified by MRN, date of birth, ID band Patient awake    Reviewed: Allergy & Precautions, H&P , NPO status , reviewed documented beta blocker date and time   Airway Mallampati: II   Neck ROM: limited  Mouth opening: Limited Mouth Opening  Dental  (+) Teeth Intact, Caps   Pulmonary asthma , sleep apnea and Oxygen sleep apnea , pneumonia, resolved,  Pulm HTN 2 to Sarcoid, home O2, prostaglandin infusion   Pulmonary exam normal        Cardiovascular hypertension, + Peripheral Vascular Disease  Normal cardiovascular exam+ Valvular Problems/Murmurs   06/2020 ECHO IMPRESSIONS   1. No evidence of thrombus.  2. Left ventricular ejection fraction, by estimation, is 35 to 40%. The  left ventricle has mild to moderately decreased function. The left  ventricle demonstrates global hypokinesis. Left ventricular diastolic  parameters are consistent with Grade III  diastolic dysfunction (restrictive).  3. Right ventricular systolic function is normal. The right ventricular  size is normal.  4. The mitral valve is normal in structure. No evidence of mitral valve  regurgitation.  5. The aortic valve is grossly normal. Aortic valve regurgitation is not  visualized.    Neuro/Psych PSYCHIATRIC DISORDERS Depression CVA    GI/Hepatic GERD  Controlled,  Endo/Other  Hypothyroidism Hyperthyroidism   Renal/GU Renal disease     Musculoskeletal   Abdominal   Peds  Hematology  (+) Blood dyscrasia, anemia ,   Anesthesia Other Findings Past Medical History: No date: Acute respiratory failure with hypoxia (HCC) No date: Arrhythmia     Comment:  patient unaware if this is current No date: Asthma No date: Chronic kidney disease No date: Depression No date: GERD (gastroesophageal reflux disease) No date: Heart murmur No date: History of kidney stones No date: HOH (hard of hearing)     Comment:  wear  aids No date: Hyperthyroidism No date: Hypothyroidism No date: IBS (irritable bowel syndrome) No date: Pneumonia No date: Pulmonary hypertension ([place]) No date: Sarcoid No date: Sarcoidosis No date: Seasonal allergies CPAP with O2: Sleep apnea 07/2020: Stroke ([place])     Comment:  [place] No date: Wears hearing aid in both ears  Past Surgical History: No date: ABDOMINAL HYSTERECTOMY     Comment:  partial 10/18/2018: CARDIAC CATHETERIZATION     Comment:  [facility] 07/31/2019: CATARACT EXTRACTION W/PHACO; Left     Comment:  Procedure: CATARACT EXTRACTION PHACO AND INTRAOCULAR               LENS PLACEMENT (IOC) LEFT 00:51.1  17.9%  9.15;  Surgeon:              Leandrew Koyanagi, MD;  Location: [facility];              Service: Ophthalmology;  Laterality: Left;  keep this               patient second No date: COLON SURGERY     Comment:  "colon was fused to bladder - operated on both" 09/18/2007: COLONOSCOPY     Comment:  diverticuli, no polyps 05/26/2010: COLONOSCOPY     Comment:  diverticuli, no polyps 07/14/2020: CYSTOSCOPY WITH STENT PLACEMENT; Bilateral     Comment:  Procedure: CYSTOSCOPY WITH STENT PLACEMENT,               RETROPYLOGRAM;  Surgeon: Billey Co, MD;  Location:              [facility];  Service: Urology;  Laterality: Bilateral; 02/20/2020: CYSTOSCOPY/URETEROSCOPY/HOLMIUM LASER/STENT PLACEMENT; Left     Comment:  Procedure: CYSTOSCOPY/URETEROSCOPY/LITHOTRIPSY /STENT               PLACEMENT;  Surgeon: Hollice Espy, MD;  Location: [facility]              ORS;  Service: Urology;  Laterality: Left; 10/02/2020: CYSTOSCOPY/URETEROSCOPY/HOLMIUM LASER/STENT PLACEMENT;  Bilateral     Comment:  Procedure: CYSTOSCOPY/URETEROSCOPY/HOLMIUM LASER/STENT               PLACEMENT;  Surgeon: Billey Co, MD;  Location:               [facility];  Service: Urology;  Laterality: Bilateral; No date: EYE SURGERY 05/20/2015: PARS PLANA VITRECTOMY; Right     Comment:  Procedure:  PARS PLANA VITRECTOMY WITH 25 GAUGE, laser;                Surgeon: Milus Height, MD;  Location: [facility];                Service: Ophthalmology;  Laterality: Right; 1990: PARTIAL HYSTERECTOMY 07/27/2020: TEE WITHOUT CARDIOVERSION; N/A     Comment:  Procedure: TRANSESOPHAGEAL ECHOCARDIOGRAM (TEE);                Surgeon: Teodoro Spray, MD;  Location: [facility];                Service: Cardiovascular;  Laterality: N/A; No date: TUBAL LIGATION No date: WISDOM TOOTH EXTRACTION     Reproductive/Obstetrics                            Anesthesia Physical Anesthesia Plan  ASA: IV  Anesthesia Plan: General   Post-op Pain Management:    Induction: Intravenous  PONV Risk Score and Plan: 3 and Treatment may vary due to age or medical condition, TIVA, Propofol infusion, Ondansetron and Midazolam  Airway Management Planned: Nasal Cannula and Natural Airway  Additional Equipment:   Intra-op Plan:   Post-operative Plan:   Informed Consent: I have reviewed the patients History and Physical, chart, labs and discussed the procedure including the risks, benefits and alternatives for the proposed anesthesia with the patient or authorized representative who has indicated his/her understanding and acceptance.     Dental Advisory Given  Plan Discussed with: CRNA  Anesthesia Plan Comments: (Poss LMA if required)       Anesthesia Quick Evaluation

## 2021-01-14 NOTE — Anesthesia Postprocedure Evaluation (Signed)
Anesthesia Post Note  Patient: Stacie [name]  Procedure(s) Performed: AMPUTATION RAY (1ST TOE ) ( 2ND METATARSAL HEAD RESECTION) (Left )  Patient location during evaluation: PACU Anesthesia Type: General Level of consciousness: awake and alert Pain management: pain level controlled Vital Signs Assessment: post-procedure vital signs reviewed and stable Respiratory status: spontaneous breathing, nonlabored ventilation, respiratory function stable and patient connected to nasal cannula oxygen Cardiovascular status: blood pressure returned to baseline and stable Postop Assessment: no apparent nausea or vomiting Anesthetic complications: no   No complications documented.   Last Vitals:  Vitals:   01/14/21 1400 01/14/21 1425  BP: (!) 103/57 (!) 109/46  Pulse: 73 79  Resp: (!) 21 18  Temp:  36.8 C  SpO2: 100% 100%    Last Pain:  Vitals:   01/14/21 1425  TempSrc: Temporal  PainSc: 0-No pain                 Alphonsus Sias

## 2021-01-14 NOTE — Op Note (Signed)
  OPERATIVE NOTE   PROCEDURE: Left first toe ray toe amputation, resection of second left metatarsal head  PRE-OPERATIVE DIAGNOSIS: Left first toe gangrene, exposed second left metatarsal head after previous second and third toe autoamputation  POST-OPERATIVE DIAGNOSIS: same as above  SURGEON: Leotis Pain, MD  ASSISTANT(S): Hezzie Bump, PA-C  ANESTHESIA: general  ESTIMATED BLOOD LOSS: 10 cc  FINDING(S): none  SPECIMEN(S): Left first toe and metatarsal head, second metatarsal head  INDICATIONS:    Patient is a 65 y.o.female who presents with left great toe gangrene.  The patient is scheduled for digital amputation.  She also has an exposed metatarsal head #2 after previous auto amputation of toes 2 and 3.  This metatarsal head needs to be resected to allow wound healing.  I discussed in depth with the patient the risks, benefits, and alternatives to this procedure.  The patient is aware that the risk of this operation included but are not limited to:  bleeding, infection, myocardial infarction, stroke, death, failure to heal amputation wound, and possible need for more proximal amputation.  The patient is aware of the risks and agrees proceed forward with the procedure.  DESCRIPTION:  After full informed written consent was obtained from the patient, the patient was brought back to the operating room, and placed supine upon the operating table.  Prior to induction, the patient received IV antibiotics.  The patient was then prepped and draped in the standard fashion. Curvilinear incision was made at the base of the left great toe through the existing granulation tissue around the base of the toe. We dissected down to the bone with electrocautery and sharp dissection. The digital vessels were controlled with electrocautery. The left first toe was removed its entirety with dissection with electrocautery and blunt dissection. The metatarsal head was taken back with small bone cutter and  Ronjours to allow tension-free closure.  I then turned my attention to the second metatarsal head.  This was dissected free and taken down with periosteal elevator.  It was then transected with the TPS saw.  The wound was then closed with figure-of-eight 2-0 Vicryl over both the first and second metatarsal head resections.  The granulation tissue was left largely intact without closure of the skin. Sterile dressing was placed which included Xeroform, Kerlix, gauze and a bulky bandage.  COMPLICATIONS: none  CONDITION: stable   Leotis Pain, MD 01/14/2021 1:25 PM

## 2021-01-14 NOTE — Discharge Instructions (Signed)

## 2021-01-18 LAB — SURGICAL PATHOLOGY

## 2021-01-20 ENCOUNTER — Telehealth (INDEPENDENT_AMBULATORY_CARE_PROVIDER_SITE_OTHER): Payer: Self-pay

## 2021-01-20 DIAGNOSIS — D62 Acute posthemorrhagic anemia: Secondary | ICD-10-CM | POA: Diagnosis not present

## 2021-01-20 DIAGNOSIS — D860 Sarcoidosis of lung: Secondary | ICD-10-CM | POA: Diagnosis not present

## 2021-01-20 DIAGNOSIS — N189 Chronic kidney disease, unspecified: Secondary | ICD-10-CM | POA: Diagnosis not present

## 2021-01-20 DIAGNOSIS — I70223 Atherosclerosis of native arteries of extremities with rest pain, bilateral legs: Secondary | ICD-10-CM | POA: Diagnosis not present

## 2021-01-20 DIAGNOSIS — I1310 Hypertensive heart and chronic kidney disease without heart failure, with stage 1 through stage 4 chronic kidney disease, or unspecified chronic kidney disease: Secondary | ICD-10-CM | POA: Diagnosis not present

## 2021-01-20 DIAGNOSIS — R339 Retention of urine, unspecified: Secondary | ICD-10-CM | POA: Diagnosis not present

## 2021-01-20 DIAGNOSIS — R739 Hyperglycemia, unspecified: Secondary | ICD-10-CM | POA: Diagnosis not present

## 2021-01-20 DIAGNOSIS — T380X5D Adverse effect of glucocorticoids and synthetic analogues, subsequent encounter: Secondary | ICD-10-CM | POA: Diagnosis not present

## 2021-01-20 DIAGNOSIS — H9190 Unspecified hearing loss, unspecified ear: Secondary | ICD-10-CM | POA: Diagnosis not present

## 2021-01-20 DIAGNOSIS — J45909 Unspecified asthma, uncomplicated: Secondary | ICD-10-CM | POA: Diagnosis not present

## 2021-01-20 DIAGNOSIS — D696 Thrombocytopenia, unspecified: Secondary | ICD-10-CM | POA: Diagnosis not present

## 2021-01-20 DIAGNOSIS — I272 Pulmonary hypertension, unspecified: Secondary | ICD-10-CM | POA: Diagnosis not present

## 2021-01-20 DIAGNOSIS — N179 Acute kidney failure, unspecified: Secondary | ICD-10-CM | POA: Diagnosis not present

## 2021-01-20 DIAGNOSIS — E039 Hypothyroidism, unspecified: Secondary | ICD-10-CM | POA: Diagnosis not present

## 2021-01-20 DIAGNOSIS — Z4800 Encounter for change or removal of nonsurgical wound dressing: Secondary | ICD-10-CM | POA: Diagnosis not present

## 2021-01-20 DIAGNOSIS — I088 Other rheumatic multiple valve diseases: Secondary | ICD-10-CM | POA: Diagnosis not present

## 2021-01-21 DIAGNOSIS — M810 Age-related osteoporosis without current pathological fracture: Secondary | ICD-10-CM | POA: Diagnosis not present

## 2021-01-22 DIAGNOSIS — G4733 Obstructive sleep apnea (adult) (pediatric): Secondary | ICD-10-CM | POA: Diagnosis not present

## 2021-01-22 NOTE — Telephone Encounter (Signed)
A detailed message was left on the nurse voicemail

## 2021-01-22 NOTE — Telephone Encounter (Signed)
Xerform and dry gauze.

## 2021-01-25 ENCOUNTER — Ambulatory Visit: Payer: BC Managed Care – PPO

## 2021-01-31 ENCOUNTER — Inpatient Hospital Stay
Admission: EM | Admit: 2021-01-31 | Discharge: 2021-02-04 | DRG: 871 | Disposition: A | Discharge status: Home Health | Payer: BC Managed Care – PPO | Attending: Internal Medicine | Admitting: Internal Medicine

## 2021-01-31 ENCOUNTER — Emergency Department: Payer: BC Managed Care – PPO

## 2021-01-31 ENCOUNTER — Other Ambulatory Visit: Payer: Self-pay

## 2021-01-31 DIAGNOSIS — R0602 Shortness of breath: Secondary | ICD-10-CM | POA: Diagnosis not present

## 2021-01-31 DIAGNOSIS — Z7952 Long term (current) use of systemic steroids: Secondary | ICD-10-CM

## 2021-01-31 DIAGNOSIS — I739 Peripheral vascular disease, unspecified: Secondary | ICD-10-CM | POA: Diagnosis present

## 2021-01-31 DIAGNOSIS — R55 Syncope and collapse: Secondary | ICD-10-CM | POA: Diagnosis not present

## 2021-01-31 DIAGNOSIS — R600 Localized edema: Secondary | ICD-10-CM | POA: Diagnosis not present

## 2021-01-31 DIAGNOSIS — Z888 Allergy status to other drugs, medicaments and biological substances status: Secondary | ICD-10-CM | POA: Diagnosis not present

## 2021-01-31 DIAGNOSIS — M810 Age-related osteoporosis without current pathological fracture: Secondary | ICD-10-CM | POA: Diagnosis present

## 2021-01-31 DIAGNOSIS — N183 Chronic kidney disease, stage 3 unspecified: Secondary | ICD-10-CM | POA: Diagnosis present

## 2021-01-31 DIAGNOSIS — Z885 Allergy status to narcotic agent status: Secondary | ICD-10-CM | POA: Diagnosis not present

## 2021-01-31 DIAGNOSIS — N1831 Chronic kidney disease, stage 3a: Secondary | ICD-10-CM | POA: Diagnosis not present

## 2021-01-31 DIAGNOSIS — Z89412 Acquired absence of left great toe: Secondary | ICD-10-CM

## 2021-01-31 DIAGNOSIS — Z7982 Long term (current) use of aspirin: Secondary | ICD-10-CM

## 2021-01-31 DIAGNOSIS — E0590 Thyrotoxicosis, unspecified without thyrotoxic crisis or storm: Secondary | ICD-10-CM | POA: Diagnosis present

## 2021-01-31 DIAGNOSIS — N179 Acute kidney failure, unspecified: Secondary | ICD-10-CM | POA: Diagnosis not present

## 2021-01-31 DIAGNOSIS — D860 Sarcoidosis of lung: Secondary | ICD-10-CM | POA: Diagnosis not present

## 2021-01-31 DIAGNOSIS — J9621 Acute and chronic respiratory failure with hypoxia: Secondary | ICD-10-CM

## 2021-01-31 DIAGNOSIS — R0689 Other abnormalities of breathing: Secondary | ICD-10-CM | POA: Diagnosis not present

## 2021-01-31 DIAGNOSIS — E039 Hypothyroidism, unspecified: Secondary | ICD-10-CM | POA: Diagnosis not present

## 2021-01-31 DIAGNOSIS — K589 Irritable bowel syndrome without diarrhea: Secondary | ICD-10-CM | POA: Diagnosis present

## 2021-01-31 DIAGNOSIS — Z881 Allergy status to other antibiotic agents status: Secondary | ICD-10-CM | POA: Diagnosis not present

## 2021-01-31 DIAGNOSIS — F32A Depression, unspecified: Secondary | ICD-10-CM | POA: Diagnosis present

## 2021-01-31 DIAGNOSIS — Z90711 Acquired absence of uterus with remaining cervical stump: Secondary | ICD-10-CM

## 2021-01-31 DIAGNOSIS — Z8673 Personal history of transient ischemic attack (TIA), and cerebral infarction without residual deficits: Secondary | ICD-10-CM

## 2021-01-31 DIAGNOSIS — Z974 Presence of external hearing-aid: Secondary | ICD-10-CM

## 2021-01-31 DIAGNOSIS — Z882 Allergy status to sulfonamides status: Secondary | ICD-10-CM | POA: Diagnosis not present

## 2021-01-31 DIAGNOSIS — Z20822 Contact with and (suspected) exposure to covid-19: Secondary | ICD-10-CM | POA: Diagnosis present

## 2021-01-31 DIAGNOSIS — S92322A Displaced fracture of second metatarsal bone, left foot, initial encounter for closed fracture: Secondary | ICD-10-CM | POA: Diagnosis not present

## 2021-01-31 DIAGNOSIS — I2721 Secondary pulmonary arterial hypertension: Secondary | ICD-10-CM | POA: Diagnosis present

## 2021-01-31 DIAGNOSIS — I272 Pulmonary hypertension, unspecified: Secondary | ICD-10-CM | POA: Diagnosis present

## 2021-01-31 DIAGNOSIS — A411 Sepsis due to other specified staphylococcus: Principal | ICD-10-CM | POA: Diagnosis present

## 2021-01-31 DIAGNOSIS — Z1611 Resistance to penicillins: Secondary | ICD-10-CM | POA: Diagnosis present

## 2021-01-31 DIAGNOSIS — Z8701 Personal history of pneumonia (recurrent): Secondary | ICD-10-CM

## 2021-01-31 DIAGNOSIS — R000 Tachycardia, unspecified: Secondary | ICD-10-CM | POA: Diagnosis not present

## 2021-01-31 DIAGNOSIS — Z825 Family history of asthma and other chronic lower respiratory diseases: Secondary | ICD-10-CM

## 2021-01-31 DIAGNOSIS — R0902 Hypoxemia: Secondary | ICD-10-CM | POA: Diagnosis not present

## 2021-01-31 DIAGNOSIS — Z8619 Personal history of other infectious and parasitic diseases: Secondary | ICD-10-CM

## 2021-01-31 DIAGNOSIS — E782 Mixed hyperlipidemia: Secondary | ICD-10-CM

## 2021-01-31 DIAGNOSIS — J189 Pneumonia, unspecified organism: Secondary | ICD-10-CM | POA: Diagnosis not present

## 2021-01-31 DIAGNOSIS — Z87442 Personal history of urinary calculi: Secondary | ICD-10-CM

## 2021-01-31 DIAGNOSIS — M86172 Other acute osteomyelitis, left ankle and foot: Secondary | ICD-10-CM | POA: Diagnosis not present

## 2021-01-31 DIAGNOSIS — G4733 Obstructive sleep apnea (adult) (pediatric): Secondary | ICD-10-CM | POA: Diagnosis present

## 2021-01-31 DIAGNOSIS — A419 Sepsis, unspecified organism: Secondary | ICD-10-CM | POA: Diagnosis present

## 2021-01-31 DIAGNOSIS — M869 Osteomyelitis, unspecified: Secondary | ICD-10-CM | POA: Diagnosis not present

## 2021-01-31 DIAGNOSIS — J45909 Unspecified asthma, uncomplicated: Secondary | ICD-10-CM | POA: Diagnosis present

## 2021-01-31 DIAGNOSIS — H9193 Unspecified hearing loss, bilateral: Secondary | ICD-10-CM | POA: Diagnosis present

## 2021-01-31 DIAGNOSIS — R700 Elevated erythrocyte sedimentation rate: Secondary | ICD-10-CM | POA: Diagnosis present

## 2021-01-31 DIAGNOSIS — I10 Essential (primary) hypertension: Secondary | ICD-10-CM | POA: Diagnosis not present

## 2021-01-31 DIAGNOSIS — Z79890 Hormone replacement therapy (postmenopausal): Secondary | ICD-10-CM

## 2021-01-31 DIAGNOSIS — D84821 Immunodeficiency due to drugs: Secondary | ICD-10-CM | POA: Diagnosis not present

## 2021-01-31 DIAGNOSIS — K219 Gastro-esophageal reflux disease without esophagitis: Secondary | ICD-10-CM | POA: Diagnosis present

## 2021-01-31 DIAGNOSIS — Z7951 Long term (current) use of inhaled steroids: Secondary | ICD-10-CM

## 2021-01-31 DIAGNOSIS — R0600 Dyspnea, unspecified: Secondary | ICD-10-CM | POA: Diagnosis not present

## 2021-01-31 DIAGNOSIS — Z79899 Other long term (current) drug therapy: Secondary | ICD-10-CM

## 2021-01-31 DIAGNOSIS — I129 Hypertensive chronic kidney disease with stage 1 through stage 4 chronic kidney disease, or unspecified chronic kidney disease: Secondary | ICD-10-CM | POA: Diagnosis not present

## 2021-01-31 DIAGNOSIS — J8410 Pulmonary fibrosis, unspecified: Secondary | ICD-10-CM | POA: Diagnosis not present

## 2021-01-31 LAB — CBC WITH DIFFERENTIAL/PLATELET
Abs Immature Granulocytes: 0.16 10*3/uL — ABNORMAL HIGH (ref 0.00–0.07)
Basophils Absolute: 0.1 10*3/uL (ref 0.0–0.1)
Basophils Relative: 0 %
Eosinophils Absolute: 0 10*3/uL (ref 0.0–0.5)
Eosinophils Relative: 0 %
HCT: 34.9 % — ABNORMAL LOW (ref 36.0–46.0)
Hemoglobin: 11.2 g/dL — ABNORMAL LOW (ref 12.0–15.0)
Immature Granulocytes: 1 %
Lymphocytes Relative: 3 %
Lymphs Abs: 0.6 10*3/uL — ABNORMAL LOW (ref 0.7–4.0)
MCH: 29.7 pg (ref 26.0–34.0)
MCHC: 32.1 g/dL (ref 30.0–36.0)
MCV: 92.6 fL (ref 80.0–100.0)
Monocytes Absolute: 2 10*3/uL — ABNORMAL HIGH (ref 0.1–1.0)
Monocytes Relative: 10 %
Neutro Abs: 18.2 10*3/uL — ABNORMAL HIGH (ref 1.7–7.7)
Neutrophils Relative %: 86 %
Platelets: 284 10*3/uL (ref 150–400)
RBC: 3.77 MIL/uL — ABNORMAL LOW (ref 3.87–5.11)
RDW: 14.9 % (ref 11.5–15.5)
WBC: 21.1 10*3/uL — ABNORMAL HIGH (ref 4.0–10.5)
nRBC: 0 % (ref 0.0–0.2)

## 2021-01-31 LAB — TROPONIN I (HIGH SENSITIVITY): Troponin I (High Sensitivity): 12 ng/L (ref <18)

## 2021-01-31 LAB — BASIC METABOLIC PANEL
Anion gap: 11 (ref 5–15)
BUN: 25 mg/dL — ABNORMAL HIGH (ref 8–23)
CO2: 29 mmol/L (ref 22–32)
Calcium: 8.6 mg/dL — ABNORMAL LOW (ref 8.9–10.3)
Chloride: 95 mmol/L — ABNORMAL LOW (ref 98–111)
Creatinine, Ser: 1.25 mg/dL — ABNORMAL HIGH (ref 0.44–1.00)
GFR, Estimated: 48 mL/min — ABNORMAL LOW (ref >60)
Glucose, Bld: 161 mg/dL — ABNORMAL HIGH (ref 70–99)
Potassium: 3.5 mmol/L (ref 3.5–5.1)
Sodium: 135 mmol/L (ref 135–145)

## 2021-01-31 LAB — RESP PANEL BY RT-PCR (FLU A&B, COVID) ARPGX2
Influenza A by PCR: NEGATIVE
Influenza B by PCR: NEGATIVE
SARS Coronavirus 2 by RT PCR: NEGATIVE

## 2021-01-31 MED ORDER — ACETAMINOPHEN 650 MG RE SUPP: 650.0000 mg | Freq: Four times a day (QID) | RECTAL | Status: DC | PRN

## 2021-01-31 MED ORDER — SODIUM CHLORIDE 0.9 % IV SOLN
1.0000 g | Freq: Once | INTRAVENOUS | Status: AC
  Administered 2021-01-31: 1 g via INTRAVENOUS
  Filled 2021-01-31: qty 10

## 2021-01-31 MED ORDER — LACTATED RINGERS IV SOLN: INTRAVENOUS | Status: DC

## 2021-01-31 MED ORDER — TREPROSTINIL 0.6 MG/ML IN SOLN: 0.6000 ug | RESPIRATORY_TRACT | Status: DC

## 2021-01-31 MED ORDER — METHYLPREDNISOLONE SODIUM SUCC 40 MG IJ SOLR
40.0000 mg | Freq: Two times a day (BID) | INTRAMUSCULAR | Status: DC
  Administered 2021-01-31 – 2021-02-03 (×7): 40 mg via INTRAVENOUS
  Filled 2021-01-31 (×7): qty 1

## 2021-01-31 MED ORDER — ACETAMINOPHEN 325 MG PO TABS
650.0000 mg | ORAL_TABLET | Freq: Four times a day (QID) | ORAL | Status: DC | PRN
  Administered 2021-02-01 – 2021-02-02 (×4): 650 mg via ORAL
  Filled 2021-01-31 (×4): qty 2

## 2021-01-31 MED ORDER — IPRATROPIUM-ALBUTEROL 0.5-2.5 (3) MG/3ML IN SOLN
3.0000 mL | Freq: Three times a day (TID) | RESPIRATORY_TRACT | Status: DC
  Administered 2021-02-01 – 2021-02-04 (×11): 3 mL via RESPIRATORY_TRACT
  Filled 2021-01-31 (×10): qty 3

## 2021-01-31 MED ORDER — SODIUM CHLORIDE 0.9 % IV SOLN
1.0000 g | Freq: Once | INTRAVENOUS | Status: AC
  Administered 2021-02-01: 1 g via INTRAVENOUS
  Filled 2021-01-31 (×2): qty 10

## 2021-01-31 MED ORDER — ONDANSETRON HCL 4 MG PO TABS: 4.0000 mg | ORAL_TABLET | Freq: Four times a day (QID) | ORAL | Status: DC | PRN

## 2021-01-31 MED ORDER — SENNOSIDES-DOCUSATE SODIUM 8.6-50 MG PO TABS: 1.0000 | ORAL_TABLET | Freq: Every evening | ORAL | Status: DC | PRN

## 2021-01-31 MED ORDER — POTASSIUM CITRATE ER 10 MEQ (1080 MG) PO TBCR
20.0000 meq | EXTENDED_RELEASE_TABLET | Freq: Three times a day (TID) | ORAL | Status: DC
  Administered 2021-02-01 – 2021-02-04 (×9): 20 meq via ORAL
  Filled 2021-01-31 (×13): qty 2

## 2021-01-31 MED ORDER — ESCITALOPRAM OXALATE 20 MG PO TABS
20.0000 mg | ORAL_TABLET | Freq: Every day | ORAL | Status: DC
  Administered 2021-02-01 – 2021-02-04 (×4): 20 mg via ORAL
  Filled 2021-01-31 (×3): qty 2
  Filled 2021-01-31: qty 1
  Filled 2021-01-31 (×2): qty 2

## 2021-01-31 MED ORDER — SODIUM CHLORIDE 0.9 % IV SOLN
500.0000 mg | INTRAVENOUS | Status: DC
  Administered 2021-02-01: 500 mg via INTRAVENOUS
  Filled 2021-01-31 (×2): qty 500

## 2021-01-31 MED ORDER — SODIUM CHLORIDE 0.9 % IV SOLN
500.0000 mg | Freq: Once | INTRAVENOUS | Status: AC
  Administered 2021-01-31: 500 mg via INTRAVENOUS
  Filled 2021-01-31: qty 500

## 2021-01-31 MED ORDER — HYDROCOD POLST-CPM POLST ER 10-8 MG/5ML PO SUER
5.0000 mL | Freq: Two times a day (BID) | ORAL | Status: DC | PRN
  Administered 2021-01-31 – 2021-02-01 (×2): 5 mL via ORAL
  Filled 2021-01-31 (×2): qty 5

## 2021-01-31 MED ORDER — METHOTREXATE 2.5 MG PO TABS
25.0000 mg | ORAL_TABLET | ORAL | Status: DC
  Filled 2021-01-31: qty 10

## 2021-01-31 MED ORDER — ATORVASTATIN CALCIUM 20 MG PO TABS
40.0000 mg | ORAL_TABLET | Freq: Every day | ORAL | Status: DC
  Administered 2021-02-01 – 2021-02-04 (×4): 40 mg via ORAL
  Filled 2021-01-31 (×4): qty 2

## 2021-01-31 MED ORDER — METHYLPREDNISOLONE SODIUM SUCC 125 MG IJ SOLR
125.0000 mg | Freq: Once | INTRAMUSCULAR | Status: DC
  Filled 2021-01-31: qty 2

## 2021-01-31 MED ORDER — MOMETASONE FURO-FORMOTEROL FUM 100-5 MCG/ACT IN AERO
2.0000 | INHALATION_SPRAY | Freq: Two times a day (BID) | RESPIRATORY_TRACT | Status: DC
  Administered 2021-01-31 – 2021-02-04 (×7): 2 via RESPIRATORY_TRACT
  Filled 2021-01-31: qty 8.8

## 2021-01-31 MED ORDER — LEVOTHYROXINE SODIUM 137 MCG PO TABS
137.0000 ug | ORAL_TABLET | Freq: Every day | ORAL | Status: DC
  Administered 2021-02-01 – 2021-02-04 (×4): 137 ug via ORAL
  Filled 2021-01-31 (×5): qty 1

## 2021-01-31 MED ORDER — ASPIRIN EC 81 MG PO TBEC
81.0000 mg | DELAYED_RELEASE_TABLET | Freq: Every day | ORAL | Status: DC
  Administered 2021-02-01 – 2021-02-04 (×4): 81 mg via ORAL
  Filled 2021-01-31 (×4): qty 1

## 2021-01-31 MED ORDER — ALBUTEROL SULFATE (2.5 MG/3ML) 0.083% IN NEBU
2.5000 mg | INHALATION_SOLUTION | Freq: Four times a day (QID) | RESPIRATORY_TRACT | Status: DC | PRN
  Administered 2021-01-31 – 2021-02-02 (×2): 2.5 mg via RESPIRATORY_TRACT
  Filled 2021-01-31 (×2): qty 3

## 2021-01-31 MED ORDER — ONDANSETRON HCL 4 MG/2ML IJ SOLN: 4.0000 mg | Freq: Four times a day (QID) | INTRAMUSCULAR | Status: DC | PRN

## 2021-01-31 MED ORDER — ENOXAPARIN SODIUM 40 MG/0.4ML ~~LOC~~ SOLN
40.0000 mg | SUBCUTANEOUS | Status: DC
  Administered 2021-01-31 – 2021-02-03 (×4): 40 mg via SUBCUTANEOUS
  Filled 2021-01-31 (×4): qty 0.4

## 2021-01-31 NOTE — ED Triage Notes (Signed)
Pt BIBA from home for worsening SOB. Pt has had a cold since last Wednesday. Pt has a syncopal episode everytime upon standing. Pt has wheezing and rhonchi in all fields per EMS. EMS gave x2 DuoNebs, 171m solumedrol, and 300cc fluids. Pt in sinus tach, BP 146/51 on arrival.

## 2021-01-31 NOTE — H&P (Signed)
History and Physical    Stacie [name] KPV:[id number] DOB: 06-22-1956 DOA: 01/31/2021  PCP: Stacie Hess, MD  Patient coming from: Home via EMS  I have personally briefly reviewed patient's old medical records in [place]  Chief Complaint: Shortness of breath  HPI: Stacie [name] is a 65 y.o. 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  History is supplemented by patient's husband at bedside.  5 days ago patient began to have URI symptoms with clear rhinorrhea and increasing cough compared to her baseline.  She had sick contacts who are also experiencing URI/cold type symptoms.  Symptoms were initially mild and began to improve after a couple of days however yesterday today her symptoms became more severe.  She is now experiencing increased shortness of breath compared to her baseline.  Cough is increased in frequency and occasionally productive of clear sputum.  She has been having subjective fevers and drenching sweats.  She has had some emesis after eating.  She has chest wall pain which she attributes to frequent coughing.  She denies any abdominal pain, dysuria, diarrhea.  Patient has also had several syncopal episodes when she was attempting to walk back and forth to the bathroom.  EMS were called to home.  Per ED triage notes, she was found to be orthostatic and was given 300 cc of fluid.  She is given 125 mg IV Solu-Medrol and 2 DuoNeb treatments on route to the ED.  ED Course:  Initial vitals showed BP 126/63, pulse 109, RR 25, temp 98.9 F, SPO2 90% on 2 L of home O2 via [place].  SPO2 was as low as 82% while in  the ED.  Labs show WBC 21.1, hemoglobin 11.2, platelets 284,000, sodium 135, potassium 3.5, bicarb 29, BUN 25, creatinine 1.25, serum glucose 161, high-sensitivity troponin I 12.  Blood cultures obtained and pending.  SARS-CoV-2 PCR negative.  Influenza A/B PCR negative.  Portable chest x-ray shows progressive asymmetric pulmonary infiltrates, more severe in the left apex.  Patient was given IV ceftriaxone and azithromycin.  The hospitalist service was consulted to admit for further evaluation and management.  Review of Systems: All systems reviewed and are negative except as documented in history of present illness above.   Past Medical History:  Diagnosis Date  . Acute respiratory failure with hypoxia ([place])   . Arrhythmia    patient unaware if this is current  . Asthma   . Chronic kidney disease   . Depression   . GERD (gastroesophageal reflux disease)   . Heart murmur   . History of kidney stones   . HOH (hard of hearing)    wear aids  . Hyperthyroidism   . Hypothyroidism   . IBS (irritable bowel syndrome)   . Pneumonia   . Pulmonary hypertension ([place])   . Sarcoid   . Sarcoidosis   . Seasonal allergies   . Sleep apnea CPAP with O2  . Stroke ([place]) 07/2020   [place]  . Wears hearing aid in both ears     Past Surgical History:  Procedure Laterality Date  . ABDOMINAL HYSTERECTOMY     partial  . AMPUTATION Left 01/14/2021   Procedure:  AMPUTATION RAY (1ST TOE ) ( 2ND METATARSAL HEAD RESECTION);  Surgeon: Algernon Huxley, MD;  Location: [facility];  Service: Vascular;  Laterality: Left;  . CARDIAC CATHETERIZATION  10/18/2018   [facility]  . CATARACT EXTRACTION W/PHACO Left 07/31/2019   Procedure: CATARACT EXTRACTION PHACO AND INTRAOCULAR LENS PLACEMENT (IOC) LEFT 00:51.1  17.9%  9.15;  Surgeon: Leandrew Koyanagi, MD;  Location: [facility];  Service: Ophthalmology;  Laterality: Left;  keep this patient second  . COLON SURGERY     "colon was fused to bladder - operated  on both"  . COLONOSCOPY  09/18/2007   diverticuli, no polyps  . COLONOSCOPY  05/26/2010   diverticuli, no polyps  . CYSTOSCOPY WITH STENT PLACEMENT Bilateral 07/14/2020   Procedure: CYSTOSCOPY WITH STENT PLACEMENT, RETROPYLOGRAM;  Surgeon: Billey Co, MD;  Location: [facility];  Service: Urology;  Laterality: Bilateral;  . CYSTOSCOPY/URETEROSCOPY/HOLMIUM LASER/STENT PLACEMENT Left 02/20/2020   Procedure: CYSTOSCOPY/URETEROSCOPY/LITHOTRIPSY /STENT PLACEMENT;  Surgeon: Hollice Espy, MD;  Location: [facility];  Service: Urology;  Laterality: Left;  . CYSTOSCOPY/URETEROSCOPY/HOLMIUM LASER/STENT PLACEMENT Bilateral 10/02/2020   Procedure: CYSTOSCOPY/URETEROSCOPY/HOLMIUM LASER/STENT PLACEMENT;  Surgeon: Billey Co, MD;  Location: [facility];  Service: Urology;  Laterality: Bilateral;  . EYE SURGERY    . PARS PLANA VITRECTOMY Right 05/20/2015   Procedure: PARS PLANA VITRECTOMY WITH 25 GAUGE, laser;  Surgeon: Milus Height, MD;  Location: [facility];  Service: Ophthalmology;  Laterality: Right;  . PARTIAL HYSTERECTOMY  1990  . TEE WITHOUT CARDIOVERSION N/A 07/27/2020   Procedure: TRANSESOPHAGEAL ECHOCARDIOGRAM (TEE);  Surgeon: Teodoro Spray, MD;  Location: [facility];  Service: Cardiovascular;  Laterality: N/A;  . TUBAL LIGATION    . WISDOM TOOTH EXTRACTION      Social History:  reports that she has never smoked. She has never used smokeless tobacco. She reports that she does not drink alcohol and does not use drugs.  Allergies  Allergen Reactions  . Nitrofurantoin Nausea Only  . Tramadol Nausea And Vomiting  . Sulfa Antibiotics Rash    As an infant    Family History  Problem Relation Age of Onset  . Allergies Father   . Asthma Father   . Colon cancer Father   . Allergies Brother   . Asthma Brother   . Breast cancer Maternal Grandmother      Prior to Admission medications   Medication Sig Start Date End Date Taking? Authorizing Provider  acetaminophen (TYLENOL) 500 MG tablet  Take 500 mg by mouth every 6 (six) hours as needed for moderate pain or headache.    [provider]  aspirin EC 81 MG EC tablet Take 1 tablet (81 mg total) by mouth daily. Swallow whole. 07/29/20   Fritzi Mandes, MD  atorvastatin (LIPITOR) 40 MG tablet Take 1 tablet (40 mg total) by mouth daily. 09/30/20   Stacie Hess, MD  budesonide-formoterol [facility]) 80-4.5 MCG/ACT inhaler Take 2 puffs first thing in am and then another 2 puffs about 12 hours later. Patient taking differently: Inhale 2 puffs into the lungs in the morning and at bedtime. 08/13/20   Angiulli, Lavon Paganini, PA-C  denosumab (PROLIA) 60 MG/ML SOSY injection Inject 60 mg into the skin every 6 (six) months.    [provider]  dorzolamide (TRUSOPT) 2 % ophthalmic solution Place 1 drop into both eyes 2 (two) times daily.    [provider]  escitalopram (LEXAPRO) 20 MG tablet TAKE 1 TABLET BY MOUTH EVERY DAY Patient taking differently: Take 20 mg by mouth  daily. 12/01/20   Stacie Hess, MD  folic acid (FOLVITE) 1 MG tablet Take 1 tablet (1 mg total) by mouth daily. 08/13/20   Angiulli, Lavon Paganini, PA-C  HYDROcodone-acetaminophen (NORCO) 5-325 MG tablet Take 1 tablet by mouth every 6 (six) hours as needed for moderate pain. 01/14/21   Algernon Huxley, MD  latanoprost (XALATAN) 0.005 % ophthalmic solution Place 1 drop into the right eye at bedtime.    [provider]  levothyroxine (SYNTHROID) 137 MCG tablet TAKE 1 TABLET (137 MCG TOTAL) BY MOUTH DAILY BEFORE BREAKFAST. Patient taking differently: Take 137 mcg by mouth daily before breakfast. 12/08/20   Stacie Hess, MD  methotrexate (RHEUMATREX) 2.5 MG tablet Take 25 mg by mouth every Tuesday. 10/08/20   [provider]  Multiple Vitamins-Minerals (MULTIVITAMIN WITH MINERALS) tablet Take 1 tablet by mouth daily.    [provider]  NON FORMULARY CPAP nightly    [provider]  OXYGEN Inhale 2 L into the lungs daily as  needed (During walking and activity).     [provider]  potassium citrate (UROCIT-K) 10 MEQ (1080 MG) SR tablet Take 2 tablets (20 mEq total) by mouth 3 (three) times daily with meals. 11/25/20   Billey Co, MD  predniSONE (DELTASONE) 5 MG tablet Take 3 tablets (15 mg total) by mouth daily with breakfast. 07/30/20   Fritzi Mandes, MD  Treprostinil (TYVASO) 0.6 MG/ML SOLN Inhale 0.6 mcg into the lungs See admin instructions. 12 breaths 4 times a day    [provider]    Physical Exam: Vitals:   01/31/21 1615 01/31/21 1630 01/31/21 1730 01/31/21 1800  BP:  131/60 (!) 107/53 122/61  Pulse: (!) 108   99  Resp: (!) 25 (!) 35 19 19  Temp:      TempSrc:      SpO2: 90%   93%  Weight:      Height:       Constitutional: Resting in bed in the right lateral decubitus position, NAD, calm, comfortable Eyes: PERRL, lids and conjunctivae normal ENMT: Somewhat hard of hearing.  Mucous membranes are moist. Normal dentition.  Neck: normal, supple, no masses. Respiratory: Distant breath sounds with fine inspiratory crackles bilaterally.  Normal respiratory effort while wearing 2 L of O2 via [place]. No accessory muscle use.  Cardiovascular: Regular rate and rhythm, no murmurs / rubs / gallops. No extremity edema. 2+ pedal pulses. Abdomen: no tenderness, no masses palpated. Musculoskeletal: S/p left first ray amputation, second metatarsal head amputation, autoamputation third toe.  Right foot wound dressing in place.  No clubbing / cyanosis. Good ROM, no contractures. Normal muscle tone.  Skin: Diaphoretic, wound dressing in place left foot, this was not disturbed.   Neurologic: CN 2-12 grossly intact. Sensation intact. Strength 5/5 in all 4.  Psychiatric: Normal judgment and insight. Alert and oriented x 3. Normal mood.   Labs on Admission: I have personally reviewed following labs and imaging studies  CBC: Recent Labs  Lab 01/31/21 1606  WBC 21.1*  NEUTROABS 18.2*  HGB 11.2*   HCT 34.9*  MCV 92.6  PLT 782   Basic Metabolic Panel: Recent Labs  Lab 01/31/21 1606  NA 135  K 3.5  CL 95*  CO2 29  GLUCOSE 161*  BUN 25*  CREATININE 1.25*  CALCIUM 8.6*   GFR: Estimated Creatinine Clearance: 48.9 mL/min (A) (by C-G formula based on SCr of 1.25 mg/dL (H)). Liver Function Tests: No results for input(s): AST, ALT,  ALKPHOS, BILITOT, PROT, ALBUMIN in the last 168 hours. No results for input(s): LIPASE, AMYLASE in the last 168 hours. No results for input(s): AMMONIA in the last 168 hours. Coagulation Profile: No results for input(s): INR, PROTIME in the last 168 hours. Cardiac Enzymes: No results for input(s): CKTOTAL, CKMB, CKMBINDEX, TROPONINI in the last 168 hours. BNP (last 3 results) No results for input(s): PROBNP in the last 8760 hours. HbA1C: No results for input(s): HGBA1C in the last 72 hours. CBG: No results for input(s): GLUCAP in the last 168 hours. Lipid Profile: No results for input(s): CHOL, HDL, LDLCALC, TRIG, CHOLHDL, LDLDIRECT in the last 72 hours. Thyroid Function Tests: No results for input(s): TSH, T4TOTAL, FREET4, T3FREE, THYROIDAB in the last 72 hours. Anemia Panel: No results for input(s): VITAMINB12, FOLATE, FERRITIN, TIBC, IRON, RETICCTPCT in the last 72 hours. Urine analysis:    Component Value Date/Time   COLORURINE YELLOW (A) 07/09/2020 2327   APPEARANCEUR Cloudy (A) 10/07/2020 1016   LABSPEC 1.009 07/09/2020 2327   PHURINE 6.0 07/09/2020 2327   GLUCOSEU Negative 10/07/2020 1016   HGBUR SMALL (A) 07/09/2020 2327   BILIRUBINUR Negative 10/07/2020 1016   [place] 07/09/2020 2327   PROTEINUR 3+ (A) 10/07/2020 1016   PROTEINUR 100 (A) 07/09/2020 2327   UROBILINOGEN 0.2 01/07/2020 1406   UROBILINOGEN 0.2 11/25/2014 1849   NITRITE Negative 10/07/2020 1016   NITRITE NEGATIVE 07/09/2020 2327   LEUKOCYTESUR 2+ (A) 10/07/2020 1016   LEUKOCYTESUR MODERATE (A) 07/09/2020 2327    Radiological Exams on Admission: DG  Chest Port 1 View  Result Date: 01/31/2021 CLINICAL DATA:  Dyspnea, syncope EXAM: PORTABLE CHEST 1 VIEW COMPARISON:  07/20/2020, CT 07/10/2020 FINDINGS: There is progressive biapical pulmonary infiltrate, more severe within the left upper lobe as well as coarse infiltrate at the right lung base. This may represent interval progression of disease involving the patient's known underlying sarcoidosis or reflect superimposed acute inflammatory changes related to atypical infection upon underlying interstitial lung disease. Stable left basilar pleural thickening. No pneumothorax or pleural effusion. Cardiac size is within normal limits. Hilar enlargement is noted and relates to central pulmonary arterial enlargement better seen on prior CT examination. Multilevel vertebroplasty again noted. IMPRESSION: Progressive asymmetric apically predominant pulmonary infiltrates, more severe within the left apex. This represents either progression of the patient's underlying sarcoidosis or acute on chronic disease related to atypical infection in the acute setting. Stable hilar enlargement in keeping with pulmonary arterial hypertension. Electronically Signed   By: Fidela Salisbury MD   On: 01/31/2021 17:27    EKG: Personally reviewed. Sinus tachycardia, rate 113, PVC, no acute ischemic changes.  Rate is faster when compared to prior.  Assessment/Plan Principal Problem:   Acute on chronic respiratory failure with hypoxia (HCC) Active Problems:   Pulmonary sarcoidosis (HCC)   CKD (chronic kidney disease) stage 3, GFR 30-59 ml/min (HCC)   Obstructive sleep apnea   Hypothyroidism, acquired   Pulmonary hypertension ([place])   Lanell A Skirvin is a 65 y.o. female with medical history significant for pulmonary sarcoidosis on chronic prednisone and methotrexate, chronic respiratory failure with hypoxia on 2-3 L of home O2 via [place], pulmonary hypertension, CKD stage III, history of CVA, hypothyroidism, nephrolithiasis, left foot  gangrene s/p first ray and second metatarsal head amputation, OSA on CPAP who is admitted for acute on chronic respiratory failure hypoxia due to pulmonary sarcoidosis.  Acute on chronic respiratory failure with hypoxia Pulmonary sarcoidosis Pulmonary hypertension: Acute symptoms likely incited by recent URI.  Suspect  this is primarily related to exacerbation of pulmonary sarcoidosis however will continue empiric antibiotics for now given leukocytosis and risk for secondary bacterial pneumonia.  She is on chronic prednisone 15 mg daily at home.  Covid and influenza negative.  Follows with Dr. Lanney Gins locally and Dr[facility] Robley Fries at [facility]. -Continue IV Solu-Medrol 40 mg twice daily -Placed on scheduled duonebs, as needed albuterol -Continue Dulera and home Tyvaso, methotrexate -Continue supplemental oxygen, now maintaining on home 2 L O2 via [place] -Continue empiric IV ceftriaxone and azithromycin for now -Follow blood cultures  Orthostasis: Orthostatic at home per EMS report.  Repeat orthostatic vitals.  Give additional IV fluids if needed.  Request PT/OT eval.  History of CVA: Continue aspirin and atorvastatin.  Hypothyroidism: Continue Synthroid.  CKD stage III: Currently stable, continue monitor.  History ischemic/gangrenous left foot s/p first ray and second metatarsal head amputation: Continue wound care while in hospital.  OSA: Continue CPAP nightly.  DVT prophylaxis: Lovenox Code Status: Full code, confirmed with patient Family Communication: Discussed with patient spouse at bedside Disposition Plan: From home and likely discharge to home pending improvement in respiratory status Consults called: None Level of care: Med-Surg Admission status:  Status is: Observation The patient remains OBS appropriate and will d/c before 2 midnights.  Dispo: The patient is from: Home              Anticipated d/c is to: Home              Patient currently is not medically stable to d/c.    Difficult to place patient No  Zada Finders MD [facility]  If 7PM-7AM, please contact night-coverage [email or web address]  01/31/2021, 6:50 PM

## 2021-01-31 NOTE — ED Provider Notes (Signed)
[facility] Emergency Department Provider Note  ____________________________________________   I have reviewed the triage vital signs and the nursing notes.   HISTORY  Chief Complaint Shortness of Breath   History limited by: Not Limited   HPI Stacie [name] is a 65 y.o. female who presents to the emergency department today because of concerns for worsening shortness of breath.  The patient states that roughly 1 week ago she started developing cold-like symptoms.  She states that she got this from her grandchildren.  They have tested negative for COVID.  The patient's symptoms have included fevers, congestion, cough and shortness of breath.  She also states that she has blacked out. She does have a history of sarcoidosis and is on oxygen at baseline.  The patient states that she has not noticed any swelling or pain to her legs.  The patient was given steroids and breathing treatments EMS.  She states that she might feel slightly improved after the breathing treatments. EMS also stated that the patient was orthostatic upon arrival so she was given IV fluids.     Records reviewed. Per medical record review patient has a history of sarcoidosis.   Past Medical History:  Diagnosis Date  . Acute respiratory failure with hypoxia ([place])   . Arrhythmia    patient unaware if this is current  . Asthma   . Chronic kidney disease   . Depression   . GERD (gastroesophageal reflux disease)   . Heart murmur   . History of kidney stones   . HOH (hard of hearing)    wear aids  . Hyperthyroidism   . Hypothyroidism   . IBS (irritable bowel syndrome)   . Pneumonia   . Pulmonary hypertension ([place])   . Sarcoid   . Sarcoidosis   . Seasonal allergies   . Sleep apnea CPAP with O2  . Stroke ([place]) 07/2020   [place]  . Wears hearing aid in both ears     Patient Active Problem List   Diagnosis Date Noted  . Gangrene of left foot ([place]) 11/03/2020  . Labile blood  glucose   . Steroid-induced hyperglycemia   . Thrombocytopenia ([place])   . Acute blood loss anemia   . Slow transit constipation   . AKI (acute kidney injury) ([place])   . Supplemental oxygen dependent   . Klebsiella pneumoniae sepsis ([place])   . Critical lower limb ischemia ([place])   . Acute renal failure ([place])   . Cerebrovascular accident (CVA) due to embolism of cerebral artery ([place])   . Sepsis ([place]) 07/10/2020  . Pulmonary hypertension ([place]) 12/18/2018  . Depression, major, recurrent, in partial remission ([place]) 10/22/2018  . Compression fx, thoracic spine ([place]) 12/04/2017  . Hyperlipidemia, mixed 04/21/2017  . Cough variant asthma  vs UACS  02/20/2017  . Hypothyroidism, acquired 03/27/2015  . Essential hypertension 03/27/2015  . Acquired absence of both cervix and uterus 03/27/2015  . H/O renal calculi 03/27/2015  . Allergic rhinitis 01/07/2015  . Obstructive sleep apnea 09/29/2014  . Other malaise and fatigue 06/03/2014  . Asthma, chronic 02/24/2014  . Cough 04/30/2013  . Pulmonary sarcoidosis ([place]) 03/12/2013  . CKD (chronic kidney disease) stage 3, GFR 30-59 ml/min (H[facility]) 03/12/2013  . Calcium blood increased 05/23/2012    Past Surgical History:  Procedure Laterality Date  . ABDOMINAL HYSTERECTOMY     partial  . AMPUTATION Left 01/14/2021   Procedure: AMPUTATION RAY (1ST TOE ) ( 2ND METATARSAL HEAD RESECTION);  Surgeon: Algernon Huxley,  MD;  Location: [facility] ORS;  Service: Vascular;  Laterality: Left;  . CARDIAC CATHETERIZATION  10/18/2018   [facility]  . CATARACT EXTRACTION W/PHACO Left 07/31/2019   Procedure: CATARACT EXTRACTION PHACO AND INTRAOCULAR LENS PLACEMENT (IOC) LEFT 00:51.1  17.9%  9.15;  Surgeon: Leandrew Koyanagi, MD;  Location: [facility];  Service: Ophthalmology;  Laterality: Left;  keep this patient second  . COLON SURGERY     "colon was fused to bladder - operated on both"  . COLONOSCOPY  09/18/2007   diverticuli, no polyps  . COLONOSCOPY  05/26/2010    diverticuli, no polyps  . CYSTOSCOPY WITH STENT PLACEMENT Bilateral 07/14/2020   Procedure: CYSTOSCOPY WITH STENT PLACEMENT, RETROPYLOGRAM;  Surgeon: Billey Co, MD;  Location: [facility];  Service: Urology;  Laterality: Bilateral;  . CYSTOSCOPY/URETEROSCOPY/HOLMIUM LASER/STENT PLACEMENT Left 02/20/2020   Procedure: CYSTOSCOPY/URETEROSCOPY/LITHOTRIPSY /STENT PLACEMENT;  Surgeon: Hollice Espy, MD;  Location: [facility];  Service: Urology;  Laterality: Left;  . CYSTOSCOPY/URETEROSCOPY/HOLMIUM LASER/STENT PLACEMENT Bilateral 10/02/2020   Procedure: CYSTOSCOPY/URETEROSCOPY/HOLMIUM LASER/STENT PLACEMENT;  Surgeon: Billey Co, MD;  Location: [facility];  Service: Urology;  Laterality: Bilateral;  . EYE SURGERY    . PARS PLANA VITRECTOMY Right 05/20/2015   Procedure: PARS PLANA VITRECTOMY WITH 25 GAUGE, laser;  Surgeon: Milus Height, MD;  Location: [facility];  Service: Ophthalmology;  Laterality: Right;  . PARTIAL HYSTERECTOMY  1990  . TEE WITHOUT CARDIOVERSION N/A 07/27/2020   Procedure: TRANSESOPHAGEAL ECHOCARDIOGRAM (TEE);  Surgeon: Teodoro Spray, MD;  Location: [facility];  Service: Cardiovascular;  Laterality: N/A;  . TUBAL LIGATION    . WISDOM TOOTH EXTRACTION      Prior to Admission medications   Medication Sig Start Date End Date Taking? Authorizing Provider  acetaminophen (TYLENOL) 500 MG tablet Take 500 mg by mouth every 6 (six) hours as needed for moderate pain or headache.    [provider]  aspirin EC 81 MG EC tablet Take 1 tablet (81 mg total) by mouth daily. Swallow whole. 07/29/20   Fritzi Mandes, MD  atorvastatin (LIPITOR) 40 MG tablet Take 1 tablet (40 mg total) by mouth daily. 09/30/20   Glean Hess, MD  budesonide-formoterol [facility]) 80-4.5 MCG/ACT inhaler Take 2 puffs first thing in am and then another 2 puffs about 12 hours later. Patient taking differently: Inhale 2 puffs into the lungs in the morning and at bedtime. 08/13/20   Angiulli, Lavon Paganini, PA-C   denosumab (PROLIA) 60 MG/ML SOSY injection Inject 60 mg into the skin every 6 (six) months.    [provider]  dorzolamide (TRUSOPT) 2 % ophthalmic solution Place 1 drop into both eyes 2 (two) times daily.    [provider]  escitalopram (LEXAPRO) 20 MG tablet TAKE 1 TABLET BY MOUTH EVERY DAY Patient taking differently: Take 20 mg by mouth daily. 12/01/20   Glean Hess, MD  folic acid (FOLVITE) 1 MG tablet Take 1 tablet (1 mg total) by mouth daily. 08/13/20   Angiulli, Lavon Paganini, PA-C  HYDROcodone-acetaminophen (NORCO) 5-325 MG tablet Take 1 tablet by mouth every 6 (six) hours as needed for moderate pain. 01/14/21   Algernon Huxley, MD  latanoprost (XALATAN) 0.005 % ophthalmic solution Place 1 drop into the right eye at bedtime.    [provider]  levothyroxine (SYNTHROID) 137 MCG tablet TAKE 1 TABLET (137 MCG TOTAL) BY MOUTH DAILY BEFORE BREAKFAST. Patient taking differently: Take 137 mcg by mouth daily before breakfast. 12/08/20   Glean Hess, MD  methotrexate [facility])  2.5 MG tablet Take 25 mg by mouth every Tuesday. 10/08/20   [provider]  Multiple Vitamins-Minerals (MULTIVITAMIN WITH MINERALS) tablet Take 1 tablet by mouth daily.    [provider]  NON FORMULARY CPAP nightly    [provider]  OXYGEN Inhale 2 L into the lungs daily as needed (During walking and activity).     [provider]  potassium citrate (UROCIT-K) 10 MEQ (1080 MG) SR tablet Take 2 tablets (20 mEq total) by mouth 3 (three) times daily with meals. 11/25/20   Billey Co, MD  predniSONE (DELTASONE) 5 MG tablet Take 3 tablets (15 mg total) by mouth daily with breakfast. 07/30/20   Fritzi Mandes, MD  Treprostinil (TYVASO) 0.6 MG/ML SOLN Inhale 0.6 mcg into the lungs See admin instructions. 12 breaths 4 times a day    [provider]    Allergies Nitrofurantoin, Tramadol, and Sulfa antibiotics  Family History  Problem Relation Age  of Onset  . Allergies Father   . Asthma Father   . Colon cancer Father   . Allergies Brother   . Asthma Brother   . Breast cancer Maternal Grandmother     Social History Social History   Tobacco Use  . Smoking status: Never Smoker  . Smokeless tobacco: Never Used  Vaping Use  . Vaping Use: Never used  Substance Use Topics  . Alcohol use: No  . Drug use: No    Review of Systems Constitutional: Positive for fevers.  Eyes: No visual changes. ENT: Positive for congestions.  Cardiovascular: Denies chest pain. Respiratory: Positive for cough and shortness of breath. Gastrointestinal: No abdominal pain.  No nausea, no vomiting.  No diarrhea.   Genitourinary: Negative for dysuria. Musculoskeletal: Negative for back pain. Skin: Negative for rash. Neurological: Negative for headaches, focal weakness or numbness.  ____________________________________________   PHYSICAL EXAM:  VITAL SIGNS: ED Triage Vitals  Enc Vitals Group     BP 01/31/21 1601 126/63     Pulse Rate 01/31/21 1601 (!) 113     Resp 01/31/21 1601 14     Temp 01/31/21 1608 98.9 F (37.2 C)     Temp Source 01/31/21 1608 Oral     SpO2 01/31/21 1558 98 %     Weight 01/31/21 1558 176 lb 9.4 oz (80.1 kg)     Height 01/31/21 1558 _0  (1.702 m)    Constitutional: Alert and oriented.  Eyes: Conjunctivae are normal.  ENT      Head: Normocephalic and atraumatic.      Nose: No congestion/rhinnorhea.      Mouth/Throat: Mucous membranes are moist.      Neck: No stridor. Hematological/Lymphatic/Immunilogical: No cervical lymphadenopathy. Cardiovascular: Tachycardic, regular rhythm.  No murmurs, rubs, or gallops.  Respiratory: Increased respiratory effort with diffuse wheezing/rhonci.  Gastrointestinal: Soft and non tender. No rebound. No guarding.  Genitourinary: Deferred Musculoskeletal: Normal range of motion in all extremities.  Neurologic:  Normal speech and language. No gross focal neurologic deficits are  appreciated.  Skin:  Skin is warm, dry and intact. No rash noted. Psychiatric: Mood and affect are normal. Speech and behavior are normal. Patient exhibits appropriate insight and judgment.  ____________________________________________    LABS (pertinent positives/negatives)  Trop hs 12 BMP na 135, k 3.5, glu 161, cr 1.25 CBC wbc 21.1, hgb 11.2, plt 284 ____________________________________________   EKG  I, Nance Pear, attending physician, personally viewed and interpreted this EKG  EKG Time: 1600 Rate: 113 Rhythm: sinus tachycardia with PVC  Axis: normal Intervals: qtc 452 QRS: narrow ST changes: no st elevation Impression: abnormal ekg   ____________________________________________    RADIOLOGY  CXR Worsening airspace disease concerning for worsening sarcoid vs acute on chronic process  ____________________________________________   PROCEDURES  Procedures  ____________________________________________   INITIAL IMPRESSION / ASSESSMENT AND PLAN / ED COURSE  Pertinent labs & imaging results that were available during my care of the patient were reviewed by me and considered in my medical decision making (see chart for details).   Patient presented to the emergency department today because of concerns for increasing shortness of breath.  Patient with history of sarcoid.  On exam patients lungs found to have diffuse wheezing and rhonchi.  Chest x-ray was obtained which is concerning for worsening airspace disease.  Given the fact that his symptoms have only been going on for few days I do think this is likely acute on chronic process.  Because of this antibiotics were started.  Patient had already received steroids by EMS.  Will plan on admission to the hospital service.  ____________________________________________   FINAL CLINICAL IMPRESSION(S) / ED DIAGNOSES  Final diagnoses:  SOB (shortness of breath)     Note: This dictation was prepared with  Dragon dictation. Any transcriptional errors that result from this process are unintentional     Nance Pear, MD 01/31/21 1920

## 2021-02-01 ENCOUNTER — Observation Stay: Payer: BC Managed Care – PPO

## 2021-02-01 ENCOUNTER — Encounter: Payer: Self-pay | Admitting: Internal Medicine

## 2021-02-01 DIAGNOSIS — Z888 Allergy status to other drugs, medicaments and biological substances status: Secondary | ICD-10-CM | POA: Diagnosis not present

## 2021-02-01 DIAGNOSIS — N179 Acute kidney failure, unspecified: Secondary | ICD-10-CM | POA: Diagnosis present

## 2021-02-01 DIAGNOSIS — Z20822 Contact with and (suspected) exposure to covid-19: Secondary | ICD-10-CM | POA: Diagnosis present

## 2021-02-01 DIAGNOSIS — D860 Sarcoidosis of lung: Secondary | ICD-10-CM | POA: Diagnosis present

## 2021-02-01 DIAGNOSIS — A411 Sepsis due to other specified staphylococcus: Secondary | ICD-10-CM | POA: Diagnosis present

## 2021-02-01 DIAGNOSIS — Z79899 Other long term (current) drug therapy: Secondary | ICD-10-CM | POA: Diagnosis not present

## 2021-02-01 DIAGNOSIS — Z89412 Acquired absence of left great toe: Secondary | ICD-10-CM | POA: Diagnosis not present

## 2021-02-01 DIAGNOSIS — F32A Depression, unspecified: Secondary | ICD-10-CM | POA: Diagnosis present

## 2021-02-01 DIAGNOSIS — S92322A Displaced fracture of second metatarsal bone, left foot, initial encounter for closed fracture: Secondary | ICD-10-CM | POA: Diagnosis not present

## 2021-02-01 DIAGNOSIS — M86172 Other acute osteomyelitis, left ankle and foot: Secondary | ICD-10-CM | POA: Diagnosis present

## 2021-02-01 DIAGNOSIS — E039 Hypothyroidism, unspecified: Secondary | ICD-10-CM | POA: Diagnosis present

## 2021-02-01 DIAGNOSIS — Z882 Allergy status to sulfonamides status: Secondary | ICD-10-CM | POA: Diagnosis not present

## 2021-02-01 DIAGNOSIS — M869 Osteomyelitis, unspecified: Secondary | ICD-10-CM | POA: Diagnosis not present

## 2021-02-01 DIAGNOSIS — Z8673 Personal history of transient ischemic attack (TIA), and cerebral infarction without residual deficits: Secondary | ICD-10-CM | POA: Diagnosis not present

## 2021-02-01 DIAGNOSIS — G4733 Obstructive sleep apnea (adult) (pediatric): Secondary | ICD-10-CM | POA: Diagnosis present

## 2021-02-01 DIAGNOSIS — D84821 Immunodeficiency due to drugs: Secondary | ICD-10-CM | POA: Diagnosis present

## 2021-02-01 DIAGNOSIS — N1831 Chronic kidney disease, stage 3a: Secondary | ICD-10-CM | POA: Diagnosis present

## 2021-02-01 DIAGNOSIS — Z881 Allergy status to other antibiotic agents status: Secondary | ICD-10-CM | POA: Diagnosis not present

## 2021-02-01 DIAGNOSIS — Z8701 Personal history of pneumonia (recurrent): Secondary | ICD-10-CM | POA: Diagnosis not present

## 2021-02-01 DIAGNOSIS — R0602 Shortness of breath: Secondary | ICD-10-CM | POA: Diagnosis present

## 2021-02-01 DIAGNOSIS — I2721 Secondary pulmonary arterial hypertension: Secondary | ICD-10-CM | POA: Diagnosis present

## 2021-02-01 DIAGNOSIS — J9621 Acute and chronic respiratory failure with hypoxia: Secondary | ICD-10-CM | POA: Diagnosis present

## 2021-02-01 DIAGNOSIS — N183 Chronic kidney disease, stage 3 unspecified: Secondary | ICD-10-CM | POA: Diagnosis not present

## 2021-02-01 DIAGNOSIS — Z1611 Resistance to penicillins: Secondary | ICD-10-CM | POA: Diagnosis present

## 2021-02-01 DIAGNOSIS — I129 Hypertensive chronic kidney disease with stage 1 through stage 4 chronic kidney disease, or unspecified chronic kidney disease: Secondary | ICD-10-CM | POA: Diagnosis present

## 2021-02-01 DIAGNOSIS — J189 Pneumonia, unspecified organism: Secondary | ICD-10-CM | POA: Diagnosis present

## 2021-02-01 DIAGNOSIS — Z885 Allergy status to narcotic agent status: Secondary | ICD-10-CM | POA: Diagnosis not present

## 2021-02-01 DIAGNOSIS — Z90711 Acquired absence of uterus with remaining cervical stump: Secondary | ICD-10-CM | POA: Diagnosis not present

## 2021-02-01 LAB — RESPIRATORY PANEL BY PCR

## 2021-02-01 LAB — BLOOD CULTURE ID PANEL (REFLEXED) - BCID2

## 2021-02-01 LAB — CBC
HCT: 34.7 % — ABNORMAL LOW (ref 36.0–46.0)
Hemoglobin: 11.4 g/dL — ABNORMAL LOW (ref 12.0–15.0)
MCH: 30.6 pg (ref 26.0–34.0)
MCHC: 32.9 g/dL (ref 30.0–36.0)
MCV: 93.3 fL (ref 80.0–100.0)
Platelets: 265 10*3/uL (ref 150–400)
RBC: 3.72 MIL/uL — ABNORMAL LOW (ref 3.87–5.11)
RDW: 14.7 % (ref 11.5–15.5)
WBC: 14.2 10*3/uL — ABNORMAL HIGH (ref 4.0–10.5)
nRBC: 0 % (ref 0.0–0.2)

## 2021-02-01 LAB — URINALYSIS, COMPLETE (UACMP) WITH MICROSCOPIC
Bacteria, UA: NONE SEEN
Bilirubin Urine: NEGATIVE
Glucose, UA: NEGATIVE mg/dL
Hgb urine dipstick: NEGATIVE
Ketones, ur: NEGATIVE mg/dL
Leukocytes,Ua: NEGATIVE
Nitrite: NEGATIVE
Protein, ur: 30 mg/dL — AB
Specific Gravity, Urine: 1.017 (ref 1.005–1.030)
pH: 5 (ref 5.0–8.0)

## 2021-02-01 LAB — BASIC METABOLIC PANEL
Anion gap: 10 (ref 5–15)
BUN: 26 mg/dL — ABNORMAL HIGH (ref 8–23)
CO2: 31 mmol/L (ref 22–32)
Calcium: 9 mg/dL (ref 8.9–10.3)
Chloride: 98 mmol/L (ref 98–111)
Creatinine, Ser: 1.22 mg/dL — ABNORMAL HIGH (ref 0.44–1.00)
GFR, Estimated: 49 mL/min — ABNORMAL LOW (ref >60)
Glucose, Bld: 195 mg/dL — ABNORMAL HIGH (ref 70–99)
Potassium: 4.3 mmol/L (ref 3.5–5.1)
Sodium: 139 mmol/L (ref 135–145)

## 2021-02-01 LAB — PROCALCITONIN: Procalcitonin: 2 ng/mL

## 2021-02-01 LAB — STREP PNEUMONIAE URINARY ANTIGEN: Strep Pneumo Urinary Antigen: NEGATIVE

## 2021-02-01 MED ORDER — FOLIC ACID 1 MG PO TABS
1.0000 mg | ORAL_TABLET | Freq: Every day | ORAL | Status: DC
  Administered 2021-02-02 – 2021-02-04 (×3): 1 mg via ORAL
  Filled 2021-02-01 (×3): qty 1

## 2021-02-01 MED ORDER — SODIUM CHLORIDE 0.9 % IV SOLN: INTRAVENOUS | Status: DC

## 2021-02-01 MED ORDER — IOHEXOL 300 MG/ML  SOLN
75.0000 mL | Freq: Once | INTRAMUSCULAR | Status: AC | PRN
  Administered 2021-02-01: 75 mL via INTRAVENOUS

## 2021-02-01 MED ORDER — DEXTROMETHORPHAN POLISTIREX ER 30 MG/5ML PO SUER
15.0000 mg | Freq: Two times a day (BID) | ORAL | Status: DC
  Administered 2021-02-01 – 2021-02-04 (×7): 15 mg via ORAL
  Filled 2021-02-01 (×9): qty 5

## 2021-02-01 MED ORDER — VANCOMYCIN HCL 1750 MG/350ML IV SOLN
1750.0000 mg | Freq: Once | INTRAVENOUS | Status: AC
  Administered 2021-02-01: 1750 mg via INTRAVENOUS
  Filled 2021-02-01: qty 350

## 2021-02-01 MED ORDER — VANCOMYCIN HCL 1250 MG/250ML IV SOLN
1250.0000 mg | INTRAVENOUS | Status: DC
  Administered 2021-02-02 – 2021-02-03 (×2): 1250 mg via INTRAVENOUS
  Filled 2021-02-01 (×2): qty 250

## 2021-02-01 MED ORDER — SODIUM CHLORIDE 0.9 % IV SOLN: 1.0000 g | INTRAVENOUS | Status: DC

## 2021-02-01 NOTE — Progress Notes (Signed)
Pharmacy Antibiotic Note  Stacie [name] is a 65 y.o. female admitted on 01/31/2021 with methicillin-resistant staphylococcus epidermidis bacteremia. Patient presents with chief complaint of worsening shortness of breath. She started developing cold-like symptoms about 1 week ago, including fevers, congestion, and cough occasionally productive of clear sputum. She has a past medical history of pulmonary sarcoidosis and pulmonary hypertension. Chest x-ray shows progressive asymmetric pulmonary infiltrates, more severe within the left apex, which represents either progression of sarcoidosis or acute on chronic disease related to infection. 2 of 4 bottles (aerobic bottle in both sets) from 4/17 blood cultures contain staphylococcus epidermidis with methicillin resistance.  Pharmacy has been consulted for vancomycin dosing.  WBC 14.2, PCT 2.0, Scr 1.22 (BL ~1.1) Afebrile  Plan: Give vancomycin 1750 mg IV x 1  Then give vancomycin 1250 mg IV every 24 hours (calculated AUC 515.8 using IBW & Scr 1.22) Monitor renal function daily & signs of clinical improvement Monitor peak/trough at steady-state F/u current cultures & susceptibilities F/u repeat cultures  Height: _0  (170.2 cm) Weight: 81.1 kg (178 lb 12.7 oz) IBW/kg (Calculated) : 61.6  Temp (24hrs), Avg:98.5 F (36.9 C), Min:97.8 F (36.6 C), Max:98.9 F (37.2 C)  Recent Labs  Lab 01/31/21 1606 02/01/21 0342  WBC 21.1* 14.2*  CREATININE 1.25* 1.22*    Estimated Creatinine Clearance: 50.4 mL/min (A) (by C-G formula based on SCr of 1.22 mg/dL (H)).    Allergies  Allergen Reactions  . Nitrofurantoin Nausea Only  . Plaquenil [Hydroxychloroquine]   . Tramadol Nausea And Vomiting  . Sulfa Antibiotics Rash    As an infant    Antimicrobials this admission: Azithromycin 4/17 >> 4/18 Ceftriaxone 4/17 >> 4/18 Vancomycin 4/18 >>  Dose adjustments this admission: N/A  Microbiology results: 4/17 BCx: 2/4 bottles (aerobic bottle  in both sets) staphylococcus epidermidis with methicillin resistance  Thank you for allowing pharmacy to be a part of this patient's care.  Beckey Rutter PharmD Candidate, Class of 2022 [employer]

## 2021-02-01 NOTE — Evaluation (Signed)
Occupational Therapy Evaluation Patient Details Name: Stacie [name] MRN: [id number] DOB: [birth year] Today's Date: 02/01/2021    History of Present Illness 65 y.o. female with medical history significant for pulmonary sarcoidosis on chronic prednisone and methotrexate, chronic respiratory failure with hypoxia on 2-3 L of home O2 via [place], pulmonary hypertension, CKD stage III, history of CVA, hypothyroidism, nephrolithiasis, left foot gangrene s/p first ray and second metatarsal head amputation, OSA on CPAP who presents to the ED for evaluation of shortness of breath and admitted for acute on chronic respiratory failure hypoxia due to pulmonary sarcoidosis.   Clinical Impression   Pt was seen for OT evaluation this date. Prior to hospital admission, pt was independent with ADL, IADL (except driving 2/2 recent foot surgeries), and managing 2-3L O2 at home without assist. Pt lives with her spouse and adult daughter has also been living with them. Pt pleasant, agreeable, and follows commands throughout. Mod indep with bed mobility, supervision for ADL transfers. Orthostatic vitals taken (please see below) and pt asymptomatic throughout. Currently pt demonstrates mild impairments as described below in activity tolerance and balance (See OT problem list) which functionally limit her ability to perform ADL/self-care tasks. Pt currently requires supervision-CGA for ADL/mobility. SpO2 >96% throughout. Pt demo's good carryover of PLB during session. Pt instructed in energy conservation strategies including activity pacing, work simplification, PLB, AE/DME, and home/routines modifications to maximize safety/indep. Pt would benefit from additional skilled OT services while hospitalized to address noted impairments and functional limitations (see below for any additional details) in order to maximize safety and independence while minimizing falls risk and caregiver burden. Upon hospital discharge, do not anticipate  skilled OT needs.    Orthostatic Vitals: supine BP 125/60 (77) PR 97; sitting BP 133/59 (78) PR 104; standing 0 min BP 110/61 (75) PR 111; standing 25mn BP 114/63 (78) PR 115. Pt asymptomatic throughout.     Follow Up Recommendations  No OT follow up;Supervision - Intermittent    Equipment Recommendations  None recommended by OT    Recommendations for Other Services       Precautions / Restrictions Precautions Precautions: Fall Precaution Comments: monitor orthostatics Restrictions Weight Bearing Restrictions: No Other Position/Activity Restrictions: pt reports L foot restrictions lifted by MD after toe amputations a few weeks ago      Mobility Bed Mobility Overal bed mobility: Modified Independent                  Transfers Overall transfer level: Modified independent Equipment used: None             General transfer comment: 2WW in front of her but did not use    Balance Overall balance assessment: Mild deficits observed, not formally tested                                         ADL either performed or assessed with clinical judgement   ADL Overall ADL's : Needs assistance/impaired                                       General ADL Comments: Pt at supervision to CGA for LB ADL involving standing and ADL transfers     Vision Baseline Vision/History: Wears glasses (pt endorses some blurriness in one eye 2/2 past CVA) Wears Glasses: At all  times Patient Visual Report: No change from baseline Vision Assessment?: No apparent visual deficits     Perception     Praxis      Pertinent Vitals/Pain Pain Assessment: 0-10 Pain Score: 4  Pain Location: ribs from coughing Pain Descriptors / Indicators: Aching Pain Intervention(s): Limited activity within patient's tolerance;Monitored during session;Repositioned;Other (comment) (RN aware)     Hand Dominance Right   Extremity/Trunk Assessment Upper Extremity  Assessment Upper Extremity Assessment: Overall WFL for tasks assessed (grossly at least 4+/5 bilat)   Lower Extremity Assessment Lower Extremity Assessment: RLE deficits/detail;LLE deficits/detail RLE Deficits / Details: great toe bandaged LLE Deficits / Details: s/p 1st-3rd toe amputations   Cervical / Trunk Assessment Cervical / Trunk Assessment: Normal   Communication Communication Communication: No difficulties   Cognition Arousal/Alertness: Awake/alert Behavior During Therapy: WFL for tasks assessed/performed Overall Cognitive Status: Within Functional Limits for tasks assessed                                 General Comments: mildly distractible easily redirected, follows commands well   General Comments  R great toe bandaged, L foot bandaged where 1st-3rd toes amputated, orthostatics taken, please see vitals section for details, asymptomatic    Exercises Other Exercises Other Exercises: Pt instructed in energy conservation strategies including activity pacing, work simplification, PLB, AE/DME, and home/routines modifications to maximize safety/indep   Shoulder Instructions      Home Living Family/patient expects to be discharged to:: Private residence Living Arrangements: Spouse/significant other;Children (daughter staying with her) Available Help at Discharge: Family Type of Home: House Home Access: Stairs to enter Technical brewer of Steps: 2 Entrance Stairs-Rails: Right;Left Home Layout: Two level;1/2 bath on main level Alternate Level Stairs-Number of Steps: 15 Alternate Level Stairs-Rails: Left Bathroom Shower/Tub: Tub/shower unit;Walk-in shower (both on 2nd fl)   Bathroom Toilet: Handicapped height (has both) Bathroom Accessibility: Yes How Accessible: Accessible via wheelchair;Accessible via walker Home Equipment: [place] - 2 wheels;Shower seat;Hand held shower head;Wheelchair - manual          Prior Functioning/Environment Level of  Independence: Needs assistance  Gait / Transfers Assistance Needed: up until the last couple days pt was indep with mobility, has been using 2WW the past couple days ADL's / Homemaking Assistance Needed: pt indep with ADL until the last couple days, mod indep with IADL and home O2 mgt, does not drive since recent L toe surgery   Comments: Pt endorses 2 falls in past 12 mo both recent and passed out during each per pt's report        OT Problem List: Pain;Decreased activity tolerance;Impaired balance (sitting and/or standing);Decreased knowledge of use of DME or AE      OT Treatment/Interventions: Self-care/ADL training;Therapeutic exercise;Therapeutic activities;Energy conservation;DME and/or AE instruction;Patient/family education;Balance training    OT Goals(Current goals can be found in the care plan section) Acute Rehab OT Goals Patient Stated Goal: get better then go to the beach OT Goal Formulation: With patient Time For Goal Achievement: 02/15/21 Potential to Achieve Goals: Good ADL Goals Additional ADL Goal #1: Pt will perform morning ADL routine with modified independence utilizing learned ECS to support safety. Additional ADL Goal #2: Pt will verbalize plan to utilize at least 1 learned energy conservation strategy to maximize safety/indep with ADL/IADL Additional ADL Goal #3: Pt will perform seated shower with modified independence.  OT Frequency: Min 1X/week   Barriers to D/C:  Co-evaluation              AM-PAC OT "6 Clicks" Daily Activity     Outcome Measure Help from another person eating meals?: None Help from another person taking care of personal grooming?: None Help from another person toileting, which includes using toliet, bedpan, or urinal?: A Little Help from another person bathing (including washing, rinsing, drying)?: A Little Help from another person to put on and taking off regular upper body clothing?: None Help from another person to  put on and taking off regular lower body clothing?: A Little 6 Click Score: 21   End of Session Equipment Utilized During Treatment: Gait belt;Oxygen Nurse Communication: Other (comment) (BP)  Activity Tolerance: Patient tolerated treatment well Patient left: in bed;with call bell/phone within reach;Other (comment) (per RN don't need bed alarm)  OT Visit Diagnosis: Other abnormalities of gait and mobility (R26.89);History of falling (Z91.81)                Time: 4481-8563 OT Time Calculation (min): 40 min Charges:  OT General Charges $OT Visit: 1 Visit OT Evaluation $OT Eval Moderate Complexity: 1 Mod OT Treatments $Self Care/Home Management : 23-37 mins  Hanley Hays, MPH, MS, OTR/L ascom [phone] 02/01/21, 10:53 AM

## 2021-02-01 NOTE — Progress Notes (Signed)
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 difficile OSA  At last office visit at [facility] 3/16 "Wearing oxygen currently at all times Endorses exertional dyspnea orthopnea"  4/11 developed URI symptoms with sick contacts initially around her-symptoms worsened 4/17 increasing SOB increasing cough increasing sputum, subjective fever sweats + posttussive emesis Also developed syncope and was found to be orthostatic on coming to the ED by EMS-Rx Solu-Medrol and DuoNeb SPO2 82% in ED  WBC 21 BUN/creatinine 25/1.2 blood cultures obtained CXR = asymmetric infiltrates in the left apex Rx ceftriaxone azithromycin  Hospital-Problem based course  Sepsis secondary to MRSE bacteremia?  Secondary to recent foot surgery Not convinced that this is only a pneumonia based on infiltrates on CXR ?  Secondary to recent surgery 3/31 CT scan ordered of lower extremities to rule out abscess etc. Dr. Lucky Cowboy vascular surgery aware Saline 75 cc/H Repeat chest imaging CXR a.m. 2 view I have ordered respiratory pathogen panel as she may have a superimposed viral infection in addition-COVID is negative Pulmonary sarcoid (home dose 15 mg prednisone daily), OSA, pulmonary hypertension-is on chronic oxygen for the at least the past 3 to 4 months for her own admission On methotrexate which we will continue at this time-I have asked Dr. Lanney Gins for an opinion regarding the same-continue folic acid 1  mg daily Continue Tyvaso, continue burst of steroids Solu-Medrol 40 every 12 May need further work-up according to pulmonology??-Start Tussionex 5 every 12 as needed, Delsym 15 twice daily Continue albuterol 2.5 every 6 as needed Dulera 2 puffs twice daily and DuoNebs Continue CPAP at night for OSA Orthostasis Possibly secondary to sepsis Depression Continue Lexapro 20 daily   DVT prophylaxis: Lovenox Code Status: Full Family Communication: Discussed with her husband Dr. Kathyrn Sheriff Disposition:  Status is: Observation  The patient will require care spanning > 2 midnights and should be moved to inpatient because: Persistent severe electrolyte disturbances  Dispo: The patient is from: Home              Anticipated d/c is to: Home              Patient currently is not medically stable to d/c.   Difficult to place patient No       Consultants:   Pulmonology  Vascular surgery  Procedures: CT lower extremity ordered pending  Antimicrobials: Vancomycin currently monotherapy   Subjective: States she is feeling a little better Some cough like symptoms after being around her grandson recently-felt that this was the cause Chronically on methotrexate and steroids No chest pain Tells me that the wound in her right lower extremity was visualized by her surgeon and given clean bill of health recently to my exam it looks good Overall she is tired but she seems to be improving Tells me that she can ambulate up and down stairs at home and usually does not have issues but has been little weaker in the past week  Objective: Vitals:   02/01/21 0806 02/01/21 0834 02/01/21 1046 02/01/21 1335  BP: 124/65     Pulse: 82  Resp: 20     Temp: 98.6 F (37 C)     TempSrc: Oral     SpO2: 96% 96% 96% 96%  Weight:      Height:        Intake/Output Summary (Last 24 hours) at 02/01/2021 1558 Last data filed at 02/01/2021 1105 Gross per 24 hour  Intake 350 ml  Output --  Net 350 ml    Filed Weights   01/31/21 1558 02/01/21 0419  Weight: 80.1 kg 81.1 kg    Examination:  Awake pleasant about stated age No icterus no pallor S1-S2 no murmur Neck soft supple Abdomen soft no rebound no guarding No lower extremity edema Leg looks slightly warm swollen but no pus expressed from wound that was visualized-see picture        Data Reviewed: personally reviewed   WBC 21-->14 Hemoglobin 11 Platelet 284 BUN/creatinine 25/1.2-->26/1.2 Procalcitonin 2.0 CBG range 1 61-1 95   Radiology Studies: DG Chest Port 1 View  Result Date: 01/31/2021 CLINICAL DATA:  Dyspnea, syncope EXAM: PORTABLE CHEST 1 VIEW COMPARISON:  07/20/2020, CT 07/10/2020 FINDINGS: There is progressive biapical pulmonary infiltrate, more severe within the left upper lobe as well as coarse infiltrate at the right lung base. This may represent interval progression of disease involving the patient's known underlying sarcoidosis or reflect superimposed acute inflammatory changes related to atypical infection upon underlying interstitial lung disease. Stable left basilar pleural thickening. No pneumothorax or pleural effusion. Cardiac size is within normal limits. Hilar enlargement is noted and relates to central pulmonary arterial enlargement better seen on prior CT examination. Multilevel vertebroplasty again noted. IMPRESSION: Progressive asymmetric apically predominant pulmonary infiltrates, more severe within the left apex. This represents either progression of the patient's underlying sarcoidosis or acute on chronic disease related to atypical infection in the acute setting. Stable hilar enlargement in keeping with pulmonary arterial hypertension. Electronically Signed   By: Fidela Salisbury MD   On: 01/31/2021 17:27     Scheduled Meds: . aspirin EC  81 mg Oral Daily  . atorvastatin  40 mg Oral Daily  . dextromethorphan  15 mg Oral BID  . enoxaparin (LOVENOX) injection  40 mg Subcutaneous Q24H  .  escitalopram  20 mg Oral Daily  . [START ON 5/88/3254] folic acid  1 mg Oral Daily  . ipratropium-albuterol  3 mL Nebulization TID  . levothyroxine  137 mcg Oral QAC breakfast  . [START ON 02/02/2021] methotrexate  25 mg Oral Q Tue  . methylPREDNISolone (SOLU-MEDROL) injection  40 mg Intravenous Q12H  . mometasone-formoterol  2 puff Inhalation BID  . potassium citrate  20 mEq Oral TID WC  . Treprostinil  0.6 mcg Inhalation See admin instructions   Continuous Infusions: . sodium chloride 75 mL/hr at 02/01/21 1320  . [START ON 02/02/2021] vancomycin    . vancomycin       LOS: 0 days   Time spent: 65 minutes including care coordination time between consultant and discussion with them  Nita Sells, MD [facility] To contact the attending provider between 7A-7P or the covering provider during after hours 7P-7A, please log into the web site [email or web address] and access using universal [place] password for that web site. If you do not have the password, please call the hospital operator.  02/01/2021, 3:58 PM

## 2021-02-01 NOTE — Hospital Course (Addendum)
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 difficile OSA  At last office visit at [facility] 3/16 "Wearing oxygen currently at all times Endorses exertional dyspnea orthopnea"  4/11 developed URI symptoms with sick contacts initially around her-symptoms worsened 4/17 increasing SOB increasing cough increasing sputum, subjective fever sweats + posttussive emesis Also developed syncope and was found to be orthostatic on coming to the ED by EMS-Rx Solu-Medrol and DuoNeb SPO2 82% in ED  WBC 21 BUN/creatinine 25/1.2 blood cultures obtained CXR = asymmetric infiltrates in the left apex Rx ceftriaxone azithromycin   WBC 21-->14 Hemoglobin 11 Platelet 284 BUN/creatinine 25/1.2-->26/1.2 Procalcitonin 2.0 CBG range 1 61-1 95

## 2021-02-01 NOTE — Evaluation (Signed)
Physical Therapy Evaluation Patient Details Name: Stacie [name] MRN: [id number] DOB: 1956/06/10 Today's Date: 02/01/2021   History of Present Illness  Pt is a 65 y.o. female presenting to hospital 4/17 with worsening SOB; about 1 week ago started developing cold-like symptoms (fevers, congestion, cough, and SOB); has also blacked out.  Pt orthostatic upon arrival and given IV fluids.  Pt admitted with acute on chronic respiratory failure with hypoxia, pulmonary sarcoidosis, pulmonary htn, and orthostasis.  PMH includes sarcoidosis, home O2 baseline, acute respiratory failure with hypoxia, CKD, heart murmur, HOH, IBS, sleep apnea, stroke, L foot gangrene, critical lower limb ischemia, OSA, 1st toe ray amputation and 2nd metatarsal head resection 01/14/21, cardiac cath.  Clinical Impression  Prior to hospital admission, pt was independent with ambulation; lives with family in 2 level home (bedroom upstairs).  Currently pt is modified independent semi-supine to sitting edge of bed; SBA with transfers; and CGA ambulating 160 feet with RW (trialed no AD but pt unsteady so switched back to RW use).  Pt reports no L foot restrictions s/p L toe amputations 01/14/21.  Pt would benefit from skilled PT to address noted impairments and functional limitations (see below for any additional details).  Upon hospital discharge, pt would benefit from [place].  Orthostatics taken beginning of session: Supine BP 125/69 with HR 101 bpm; sitting BP 127/64 with HR 107 bpm; standing BP at 0 minutes 111/63 with HR 110 bpm; and standing BP at 3 minutes BP 133/69 with HR 108 bpm.  End of session (with pt resting in recliner) pt's BP 116/59 with HR 97 bpm.  Pt asymptomatic during session.    Follow Up Recommendations Home health PT    Equipment Recommendations  Rolling walker with 5" wheels    Recommendations for Other Services       Precautions / Restrictions Precautions Precautions: Fall Precaution Comments: monitor  orthostatics Restrictions Weight Bearing Restrictions: No Other Position/Activity Restrictions: Pt reports L foot restrictions lifted by MD after toe amputations a few weeks ago      Mobility  Bed Mobility Overal bed mobility: Modified Independent             General bed mobility comments: Semi-supine to sitting without any noted difficulties    Transfers Overall transfer level: Needs assistance Equipment used: None Transfers: Sit to/from Stand Sit to Stand: Supervision         General transfer comment: fairly strong stand from bed; steady  Ambulation/Gait Ambulation/Gait assistance: Min guard Gait Distance (Feet): 160 Feet Assistive device: Rolling walker (2 wheeled)   Gait velocity: mildly decreased   General Gait Details: partial step through gait pattern; steady with RW use; pt mildly unsteady attempting to walk about 5 feet with no UE support to switched back to RW use  Stairs            Wheelchair Mobility    Modified Rankin (Stroke Patients Only)       Balance Overall balance assessment: Needs assistance Sitting-balance support: No upper extremity supported;Feet supported Sitting balance-Leahy Scale: Normal Sitting balance - Comments: steady sitting reaching within BOS   Standing balance support: No upper extremity supported Standing balance-Leahy Scale: Good Standing balance comment: steady standing reaching within BOS                             Pertinent Vitals/Pain Pain Assessment: Faces Pain Score: 4  Faces Pain Scale: Hurts a little bit Pain Location: ribs from  coughing Pain Descriptors / Indicators: Sore Pain Intervention(s): Limited activity within patient's tolerance;Monitored during session;Repositioned  HR and O2 on 3 L via nasal cannula stable and WFL throughout treatment session.    Home Living Family/patient expects to be discharged to:: Private residence Living Arrangements: Spouse/significant other;Children  (pt's oldest daughter) Available Help at Discharge: Family Type of Home: House Home Access: Stairs to enter Entrance Stairs-Rails: Psychiatric nurse of Steps: 2 Home Layout: Two level;1/2 bath on main level Home Equipment: [place] - 2 wheels;Shower seat;Hand held shower head;Wheelchair - manual      Prior Function Level of Independence: Needs assistance   Gait / Transfers Assistance Needed: Pt was independent with mobility but using RW past couple days  ADL's / Homemaking Assistance Needed: Per OT eval "pt indep with ADL until the last couple days, mod indep with IADL and home O2 mgt, does not drive since recent L toe surgery"  Comments: Pt reports 2 falls recently d/t passing out (no other falls in past 6 months)     Hand Dominance   Dominant Hand: Right    Extremity/Trunk Assessment   Upper Extremity Assessment Upper Extremity Assessment: Defer to OT evaluation    Lower Extremity Assessment Lower Extremity Assessment: Generalized weakness RLE Deficits / Details: great toe bandaged LLE Deficits / Details: s/p 1st-3rd toe amputations    Cervical / Trunk Assessment Cervical / Trunk Assessment: Normal  Communication   Communication: No difficulties  Cognition Arousal/Alertness: Awake/alert Behavior During Therapy: WFL for tasks assessed/performed Overall Cognitive Status: Within Functional Limits for tasks assessed                                 General Comments: A&O x4      General Comments General comments (skin integrity, edema, etc.): Orthostatics taken (see PT note clinical impression for details).  Nursing cleared pt for participation in physical therapy.  Pt agreeable to PT session.    Exercises    Assessment/Plan    PT Assessment Patient needs continued PT services  PT Problem List Decreased strength;Decreased activity tolerance;Decreased balance;Decreased mobility       PT Treatment Interventions DME instruction;Gait  training;Stair training;Functional mobility training;Therapeutic activities;Therapeutic exercise;Balance training;Patient/family education    PT Goals (Current goals can be found in the Care Plan section)  Acute Rehab PT Goals Patient Stated Goal: to improve mobility PT Goal Formulation: With patient Time For Goal Achievement: 02/15/21 Potential to Achieve Goals: Good    Frequency Min 2X/week   Barriers to discharge        Co-evaluation               AM-PAC PT "6 Clicks" Mobility  Outcome Measure Help needed turning from your back to your side while in a flat bed without using bedrails?: None Help needed moving from lying on your back to sitting on the side of a flat bed without using bedrails?: None Help needed moving to and from a bed to a chair (including a wheelchair)?: A Little Help needed standing up from a chair using your arms (e.g., wheelchair or bedside chair)?: A Little Help needed to walk in hospital room?: A Little Help needed climbing 3-5 steps with a railing? : A Little 6 Click Score: 20    End of Session Equipment Utilized During Treatment: Gait belt Activity Tolerance: Patient tolerated treatment well Patient left: in chair;with call bell/phone within reach;with chair alarm set Nurse Communication: Mobility status;Precautions  PT Visit Diagnosis: Unsteadiness on feet (R26.81);Other abnormalities of gait and mobility (R26.89);Muscle weakness (generalized) (M62.81)    Time: 7225-7505 PT Time Calculation (min) (ACUTE ONLY): 46 min   Charges:   PT Evaluation $PT Eval Low Complexity: 1 Low PT Treatments $Gait Training: 8-22 mins $Therapeutic Exercise: 8-22 mins       Leitha Bleak, PT 02/01/21, 2:09 PM

## 2021-02-01 NOTE — Consult Note (Signed)
Infectious Disease     Reason for Consult:MSSE bacteremai   Referring Physician: Dr Alinda Dooms Date of Admission:  01/31/2021   Principal Problem:   Acute on chronic respiratory failure with hypoxia ([place]) Active Problems:   Pulmonary sarcoidosis (HCC)   CKD (chronic kidney disease) stage 3, GFR 30-59 ml/min (HCC)   Obstructive sleep apnea   Hypothyroidism, acquired   Pulmonary hypertension ([place])   History of CVA (cerebrovascular accident)   HPI: Stacie [name] is a 65 y.o. 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 She had syncopal episodes as well.  She was also reporting fevers and NS. On admit temp 98.9, wbc 21.She was hypoxic to 82% on RA.  BCX done and 2/2 + MRSE.  CXR showed progressive pulm infiltrates.   Past Medical History:  Diagnosis Date  . Acute respiratory failure with hypoxia ([place])   . Arrhythmia    patient unaware if this is current  . Asthma   . Chronic kidney disease   . Depression   . GERD (gastroesophageal reflux disease)   . Heart murmur   . History of kidney stones   . HOH (hard of hearing)    wear aids  . Hyperthyroidism   . Hypothyroidism   . IBS (irritable bowel syndrome)   . Pneumonia   . Pulmonary hypertension ([place])   . Sarcoid   . Sarcoidosis   . Seasonal allergies   . Sleep apnea CPAP with O2  . Stroke ([place]) 07/2020   [place]  . Wears hearing aid in both ears    Past Surgical History:  Procedure Laterality Date  . ABDOMINAL HYSTERECTOMY     partial  . AMPUTATION Left 01/14/2021   Procedure: AMPUTATION RAY (1ST TOE ) ( 2ND METATARSAL HEAD RESECTION);  Surgeon: Algernon Huxley, MD;  Location: [facility];   Service: Vascular;  Laterality: Left;  . CARDIAC CATHETERIZATION  10/18/2018   [facility]  . CATARACT EXTRACTION W/PHACO Left 07/31/2019   Procedure: CATARACT EXTRACTION PHACO AND INTRAOCULAR LENS PLACEMENT (IOC) LEFT 00:51.1  17.9%  9.15;  Surgeon: Leandrew Koyanagi, MD;  Location: [facility];  Service: Ophthalmology;  Laterality: Left;  keep this patient second  . COLON SURGERY     "colon was fused to bladder - operated on both"  . COLONOSCOPY  09/18/2007   diverticuli, no polyps  . COLONOSCOPY  05/26/2010   diverticuli, no polyps  . CYSTOSCOPY WITH STENT PLACEMENT Bilateral 07/14/2020   Procedure: CYSTOSCOPY WITH STENT PLACEMENT, RETROPYLOGRAM;  Surgeon: Billey Co, MD;  Location: [facility];  Service: Urology;  Laterality: Bilateral;  . CYSTOSCOPY/URETEROSCOPY/HOLMIUM LASER/STENT PLACEMENT Left 02/20/2020   Procedure: CYSTOSCOPY/URETEROSCOPY/LITHOTRIPSY /STENT PLACEMENT;  Surgeon: Hollice Espy, MD;  Location: [facility];  Service: Urology;  Laterality: Left;  . CYSTOSCOPY/URETEROSCOPY/HOLMIUM LASER/STENT PLACEMENT Bilateral 10/02/2020   Procedure: CYSTOSCOPY/URETEROSCOPY/HOLMIUM LASER/STENT PLACEMENT;  Surgeon: Billey Co, MD;  Location: [facility];  Service: Urology;  Laterality: Bilateral;  . EYE SURGERY    . PARS PLANA VITRECTOMY Right 05/20/2015   Procedure: PARS PLANA VITRECTOMY WITH 25 GAUGE, laser;  Surgeon: Milus Height, MD;  Location: [facility];  Service: Ophthalmology;  Laterality: Right;  . PARTIAL HYSTERECTOMY  1990  . TEE WITHOUT CARDIOVERSION N/A 07/27/2020  Procedure: TRANSESOPHAGEAL ECHOCARDIOGRAM (TEE);  Surgeon: Teodoro Spray, MD;  Location: [facility];  Service: Cardiovascular;  Laterality: N/A;  . TUBAL LIGATION    . WISDOM TOOTH EXTRACTION     Social History   Tobacco Use  . Smoking status: Never Smoker  . Smokeless tobacco: Never Used  Vaping Use  . Vaping Use: Never used  Substance Use Topics  . Alcohol use: No  . Drug use: No   Family  History  Problem Relation Age of Onset  . Allergies Father   . Asthma Father   . Colon cancer Father   . Allergies Brother   . Asthma Brother   . Breast cancer Maternal Grandmother     Allergies:  Allergies  Allergen Reactions  . Nitrofurantoin Nausea Only  . Plaquenil [Hydroxychloroquine]   . Tramadol Nausea And Vomiting  . Sulfa Antibiotics Rash    As an infant    Current antibiotics: Antibiotics Given (last 72 hours)    Date/Time Action Medication Dose Rate   01/31/21 1810 New Bag/Given   cefTRIAXone (ROCEPHIN) 1 g in sodium chloride 0.9 % 100 mL IVPB 1 g 200 mL/hr   01/31/21 1851 New Bag/Given   azithromycin (ZITHROMAX) 500 mg in sodium chloride 0.9 % 250 mL IVPB 500 mg 250 mL/hr   02/01/21 0925 New Bag/Given   azithromycin (ZITHROMAX) 500 mg in sodium chloride 0.9 % 250 mL IVPB 500 mg 250 mL/hr   02/01/21 1024 New Bag/Given   cefTRIAXone (ROCEPHIN) 1 g in sodium chloride 0.9 % 100 mL IVPB 1 g 200 mL/hr   02/01/21 1609 New Bag/Given   vancomycin (VANCOREADY) IVPB 1750 mg/350 mL 1,750 mg 175 mL/hr      MEDICATIONS: . aspirin EC  81 mg Oral Daily  . atorvastatin  40 mg Oral Daily  . dextromethorphan  15 mg Oral BID  . enoxaparin (LOVENOX) injection  40 mg Subcutaneous Q24H  . escitalopram  20 mg Oral Daily  . [START ON 06/30/7828] folic acid  1 mg Oral Daily  . ipratropium-albuterol  3 mL Nebulization TID  . levothyroxine  137 mcg Oral QAC breakfast  . [START ON 02/02/2021] methotrexate  25 mg Oral Q Tue  . methylPREDNISolone (SOLU-MEDROL) injection  40 mg Intravenous Q12H  . mometasone-formoterol  2 puff Inhalation BID  . potassium citrate  20 mEq Oral TID WC  . Treprostinil  0.6 mcg Inhalation See admin instructions    Review of Systems - 11 systems reviewed and negative per HPI   OBJECTIVE: Temp:  [97.6 F (36.4 C)-98.8 F (37.1 C)] 97.6 F (36.4 C) (04/18 1601) Pulse Rate:  [79-106] 106 (04/18 1601) Resp:  [16-35] 16 (04/18 1601) BP: (100-137)/(53-67)  137/67 (04/18 1601) SpO2:  [93 %-97 %] 97 % (04/18 1601) Weight:  [81.1 kg] 81.1 kg (04/18 0419) Physical Exam  Constitutional:  oriented to person, place, and time. appears well-developed and well-nourished. No distress. On O2. HENT: /AT, PERRLA, no scleral icterus Mouth/Throat: Oropharynx is clear and moist. No oropharyngeal exudate.  Cardiovascular: Normal rate, regular rhythm and normal heart sounds. Exam reveals no gallop and no friction rub.  No murmur heard.  Pulmonary/Chest: coarse BS bil Neck = supple, no nuchal rigidity Abdominal: Soft. Bowel sounds are normal.  exhibits no distension. There is no tenderness.  Lymphadenopathy: no cervical adenopathy. No axillary adenopathy Neurological: alert and oriented to person, place, and time.  Skin: Skin is warm and dry. No rash noted. No erythema.  Psychiatric: a normal mood and affect.  behavior is normal.    LABS: Results for orders placed or performed during the hospital encounter of 01/31/21 (from the past 48 hour(s))  Resp Panel by RT-PCR (Flu A&B, Covid) Nasopharyngeal Swab     Status: None   Collection Time: 01/31/21  4:06 PM   Specimen: Nasopharyngeal Swab; Nasopharyngeal(NP) swabs in vial transport medium  Result Value Ref Range   SARS Coronavirus 2 by RT PCR NEGATIVE NEGATIVE    Comment: (NOTE) SARS-CoV-2 target nucleic acids are NOT DETECTED.  The SARS-CoV-2 RNA is generally detectable in upper respiratory specimens during the acute phase of infection. The lowest concentration of SARS-CoV-2 viral copies this assay can detect is 138 copies/mL. A negative result does not preclude SARS-Cov-2 infection and should not be used as the sole basis for treatment or other patient management decisions. A negative result may occur with  improper specimen collection/handling, submission of specimen other than nasopharyngeal swab, presence of viral mutation(s) within the areas targeted by this assay, and inadequate number of  viral copies(<138 copies/mL). A negative result must be combined with clinical observations, patient history, and epidemiological information. The expected result is Negative.  Fact Sheet for Patients:  EntrepreneurPulse.com.au  Fact Sheet for Healthcare Providers:  IncredibleEmployment.be  This test is no t yet approved or cleared by the [place] FDA and  has been authorized for detection and/or diagnosis of SARS-CoV-2 by FDA under an Emergency Use Authorization (EUA). This EUA will remain  in effect (meaning this test can be used) for the duration of the COVID-19 declaration under Section 564(b)(1) of the Act, 21 U.S.C.section 360bbb-3(b)(1), unless the authorization is terminated  or revoked sooner.       Influenza A by PCR NEGATIVE NEGATIVE   Influenza B by PCR NEGATIVE NEGATIVE    Comment: (NOTE) The Xpert Xpress SARS-CoV-2/FLU/RSV plus assay is intended as an aid in the diagnosis of influenza from Nasopharyngeal swab specimens and should not be used as a sole basis for treatment. Nasal washings and aspirates are unacceptable for Xpert Xpress SARS-CoV-2/FLU/RSV testing.  Fact Sheet for Patients: EntrepreneurPulse.com.au  Fact Sheet for Healthcare Providers: IncredibleEmployment.be  This test is not yet approved or cleared by the [place] FDA and has been authorized for detection and/or diagnosis of SARS-CoV-2 by FDA under an Emergency Use Authorization (EUA). This EUA will remain in effect (meaning this test can be used) for the duration of the COVID-19 declaration under Section 564(b)(1) of the Act, 21 U.S.C. section 360bbb-3(b)(1), unless the authorization is terminated or revoked.  Performed at [facility], [address] [place], [place] [address]   Basic metabolic panel     Status: Abnormal   Collection Time: 01/31/21  4:06 PM  Result Value Ref Range   Sodium 135 135  - 145 mmol/L   Potassium 3.5 3.5 - 5.1 mmol/L   Chloride 95 (L) 98 - 111 mmol/L   CO2 29 22 - 32 mmol/L   Glucose, Bld 161 (H) 70 - 99 mg/dL    Comment: Glucose reference range applies only to samples taken after fasting for at least 8 hours.   BUN 25 (H) 8 - 23 mg/dL   Creatinine, Ser 1.25 (H) 0.44 - 1.00 mg/dL   Calcium 8.6 (L) 8.9 - 10.3 mg/dL   GFR, Estimated 48 (L) >60 mL/min    Comment: (NOTE) Calculated using the CKD-EPI Creatinine Equation (2021)    Anion gap 11 5 - 15    Comment: Performed at [facility], [address] [place], [place] [address]  CBC with Differential/Platelet     Status: Abnormal   Collection Time: 01/31/21  4:06 PM  Result Value Ref Range   WBC 21.1 (H) 4.0 - 10.5 K/uL   RBC 3.77 (L) 3.87 - 5.11 MIL/uL   Hemoglobin 11.2 (L) 12.0 - 15.0 g/dL   HCT 34.9 (L) 36.0 - 46.0 %   MCV 92.6 80.0 - 100.0 fL   MCH 29.7 26.0 - 34.0 pg   MCHC 32.1 30.0 - 36.0 g/dL   RDW 14.9 11.5 - 15.5 %   Platelets 284 150 - 400 K/uL   nRBC 0.0 0.0 - 0.2 %   Neutrophils Relative % 86 %   Neutro Abs 18.2 (H) 1.7 - 7.7 K/uL   Lymphocytes Relative 3 %   Lymphs Abs 0.6 (L) 0.7 - 4.0 K/uL   Monocytes Relative 10 %   Monocytes Absolute 2.0 (H) 0.1 - 1.0 K/uL   Eosinophils Relative 0 %   Eosinophils Absolute 0.0 0.0 - 0.5 K/uL   Basophils Relative 0 %   Basophils Absolute 0.1 0.0 - 0.1 K/uL   Immature Granulocytes 1 %   Abs Immature Granulocytes 0.16 (H) 0.00 - 0.07 K/uL    Comment: Performed at [facility], [place], [place] [address]  Troponin I (High Sensitivity)     Status: None   Collection Time: 01/31/21  4:06 PM  Result Value Ref Range   Troponin I (High Sensitivity) 12 <18 ng/L    Comment: (NOTE) Elevated high sensitivity troponin I (hsTnI) values and significant  changes across serial measurements may suggest ACS but many other  chronic and acute conditions are known to elevate hsTnI results.  Refer to the "Links" section for  chest pain algorithms and additional  guidance. Performed at [facility], [address] [place], [place] [address]   Blood culture (routine x 2)     Status: None (Preliminary result)   Collection Time: 01/31/21  5:53 PM   Specimen: BLOOD  Result Value Ref Range   Specimen Description BLOOD RIGHT ANTECUBITAL    Special Requests      BOTTLES DRAWN AEROBIC AND ANAEROBIC Blood Culture adequate volume   Culture  Setup Time      GRAM POSITIVE COCCI AEROBIC BOTTLE ONLY CRITICAL VALUE NOTED.  VALUE IS CONSISTENT WITH PREVIOUSLY REPORTED AND CALLED VALUE. Performed at [facility], [address] [place], [place] [address]    Culture GRAM POSITIVE COCCI    Report Status PENDING   Blood culture (routine x 2)     Status: None (Preliminary result)   Collection Time: 01/31/21  5:53 PM   Specimen: BLOOD  Result Value Ref Range   Specimen Description BLOOD BLOOD LEFT HAND    Special Requests      BOTTLES DRAWN AEROBIC AND ANAEROBIC Blood Culture adequate volume   Culture  Setup Time      Organism ID to follow GRAM POSITIVE COCCI AEROBIC BOTTLE ONLY CRITICAL RESULT CALLED TO, READ BACK BY AND VERIFIED WITH: Laqueta Carina AT 1243 02/01/21 SDR Performed at [facility], [address], [place],  [address]    Culture GRAM POSITIVE COCCI    Report Status PENDING   Blood Culture ID Panel (Reflexed)     Status: Abnormal   Collection Time: 01/31/21  5:53 PM  Result Value Ref Range   Enterococcus faecalis NOT DETECTED NOT DETECTED   Enterococcus Faecium NOT DETECTED NOT DETECTED   Listeria monocytogenes NOT  DETECTED NOT DETECTED   Staphylococcus species DETECTED (A) NOT DETECTED    Comment: CRITICAL RESULT CALLED TO, READ BACK BY AND VERIFIED WITH:  SUSAN WATSON AT 7510 02/01/21 SDR    Staphylococcus aureus (BCID) NOT DETECTED NOT DETECTED   Staphylococcus epidermidis DETECTED (A) NOT DETECTED    Comment: Methicillin (oxacillin) resistant coagulase  negative staphylococcus. Possible blood culture contaminant (unless isolated from more than one blood culture draw or clinical case suggests pathogenicity). No antibiotic treatment is indicated for blood  culture contaminants. CRITICAL RESULT CALLED TO, READ BACK BY AND VERIFIED WITH:  SUSAN WATSON AT 1243 02/01/21 SDR    Staphylococcus lugdunensis NOT DETECTED NOT DETECTED   Streptococcus species NOT DETECTED NOT DETECTED   Streptococcus agalactiae NOT DETECTED NOT DETECTED   Streptococcus pneumoniae NOT DETECTED NOT DETECTED   Streptococcus pyogenes NOT DETECTED NOT DETECTED   A.calcoaceticus-baumannii NOT DETECTED NOT DETECTED   Bacteroides fragilis NOT DETECTED NOT DETECTED   Enterobacterales NOT DETECTED NOT DETECTED   Enterobacter cloacae complex NOT DETECTED NOT DETECTED   Escherichia coli NOT DETECTED NOT DETECTED   Klebsiella aerogenes NOT DETECTED NOT DETECTED   Klebsiella oxytoca NOT DETECTED NOT DETECTED   Klebsiella pneumoniae NOT DETECTED NOT DETECTED   Proteus species NOT DETECTED NOT DETECTED   Salmonella species NOT DETECTED NOT DETECTED   Serratia marcescens NOT DETECTED NOT DETECTED   Haemophilus influenzae NOT DETECTED NOT DETECTED   Neisseria meningitidis NOT DETECTED NOT DETECTED   Pseudomonas aeruginosa NOT DETECTED NOT DETECTED   Stenotrophomonas maltophilia NOT DETECTED NOT DETECTED   Candida albicans NOT DETECTED NOT DETECTED   Candida auris NOT DETECTED NOT DETECTED   Candida glabrata NOT DETECTED NOT DETECTED   Candida krusei NOT DETECTED NOT DETECTED   Candida parapsilosis NOT DETECTED NOT DETECTED   Candida tropicalis NOT DETECTED NOT DETECTED   Cryptococcus neoformans/gattii NOT DETECTED NOT DETECTED   Methicillin resistance mecA/C DETECTED (A) NOT DETECTED    Comment: CRITICAL RESULT CALLED TO, READ BACK BY AND VERIFIED WITH:  Laqueta Carina AT 1243 02/01/21 SDR Performed at [facility], [address] [place], [place] [address]   Basic  metabolic panel     Status: Abnormal   Collection Time: 02/01/21  3:42 AM  Result Value Ref Range   Sodium 139 135 - 145 mmol/L   Potassium 4.3 3.5 - 5.1 mmol/L   Chloride 98 98 - 111 mmol/L   CO2 31 22 - 32 mmol/L   Glucose, Bld 195 (H) 70 - 99 mg/dL    Comment: Glucose reference range applies only to samples taken after fasting for at least 8 hours.   BUN 26 (H) 8 - 23 mg/dL   Creatinine, Ser 1.22 (H) 0.44 - 1.00 mg/dL   Calcium 9.0 8.9 - 10.3 mg/dL   GFR, Estimated 49 (L) >60 mL/min    Comment: (NOTE) Calculated using the CKD-EPI Creatinine Equation (2021)    Anion gap 10 5 - 15    Comment: Performed at [facility], [address] [place], [place] [address]  CBC     Status: Abnormal   Collection Time: 02/01/21  3:42 AM  Result Value Ref Range   WBC 14.2 (H) 4.0 - 10.5 K/uL   RBC 3.72 (L) 3.87 - 5.11 MIL/uL   Hemoglobin 11.4 (L) 12.0 - 15.0 g/dL   HCT 34.7 (L) 36.0 - 46.0 %   MCV 93.3 80.0 - 100.0 fL   MCH 30.6 26.0 - 34.0 pg   MCHC 32.9 30.0 -  36.0 g/dL   RDW 14.7 11.5 - 15.5 %   Platelets 265 150 - 400 K/uL   nRBC 0.0 0.0 - 0.2 %    Comment: Performed at [facility], [place], [place] [address]  Procalcitonin - Baseline     Status: None   Collection Time: 02/01/21  3:42 AM  Result Value Ref Range   Procalcitonin 2.00 ng/mL    Comment:        Interpretation: PCT > 0.5 ng/mL and <= 2 ng/mL: Systemic infection (sepsis) is possible, but other conditions are known to elevate PCT as well. (NOTE)       Sepsis PCT Algorithm           Lower Respiratory Tract                                      Infection PCT Algorithm    ----------------------------     ----------------------------         PCT < 0.25 ng/mL                PCT < 0.10 ng/mL          Strongly encourage             Strongly discourage   discontinuation of antibiotics    initiation of antibiotics    ----------------------------     -----------------------------       PCT  0.25 - 0.50 ng/mL            PCT 0.10 - 0.25 ng/mL               OR       >80% decrease in PCT            Discourage initiation of                                            antibiotics      Encourage discontinuation           of antibiotics    ----------------------------     -----------------------------         PCT >= 0.50 ng/mL              PCT 0.26 - 0.50 ng/mL                AND       <80% decrease in PCT             Encourage initiation of                                             antibiotics       Encourage continuation           of antibiotics    ----------------------------     -----------------------------        PCT >= 0.50 ng/mL                  PCT > 0.50 ng/mL               AND         increase in PCT  Strongly encourage                                      initiation of antibiotics    Strongly encourage escalation           of antibiotics                                     -----------------------------                                           PCT <= 0.25 ng/mL                                                 OR                                        > 80% decrease in PCT                                      Discontinue / Do not initiate                                             antibiotics  Performed at [facility], [address] [place], [place] [address]   Urinalysis, Complete w Microscopic Urine, Clean Catch     Status: Abnormal   Collection Time: 02/01/21  6:08 AM  Result Value Ref Range   Color, Urine YELLOW (A) YELLOW   APPearance HAZY (A) CLEAR   Specific Gravity, Urine 1.017 1.005 - 1.030   pH 5.0 5.0 - 8.0   Glucose, UA NEGATIVE NEGATIVE mg/dL   Hgb urine dipstick NEGATIVE NEGATIVE   Bilirubin Urine NEGATIVE NEGATIVE   Ketones, ur NEGATIVE NEGATIVE mg/dL   Protein, ur 30 (A) NEGATIVE mg/dL   Nitrite NEGATIVE NEGATIVE   Leukocytes,Ua NEGATIVE NEGATIVE   WBC, UA 6-10 0 - 5 WBC/hpf   Bacteria, UA NONE SEEN NONE SEEN    Squamous Epithelial / LPF 0-5 0 - 5   Mucus PRESENT    Hyaline Casts, UA PRESENT     Comment: Performed at [facility], [address] [place], [place] [address]  Strep pneumoniae urinary antigen     Status: None   Collection Time: 02/01/21  6:08 AM  Result Value Ref Range   Strep Pneumo Urinary Antigen NEGATIVE NEGATIVE    Comment:        Infection due to S. pneumoniae cannot be absolutely ruled out since the antigen present may be below the detection limit of the test. PERFORMED AT [facility] Performed at [facility], [address] [place], [place] [address]    No components found for: ESR, C REACTIVE PROTEIN MICRO: Recent Results (from the past 720 hour(s))  SARS CORONAVIRUS  2 (TAT 6-24 HRS) Nasopharyngeal Nasopharyngeal Swab     Status: None   Collection Time: 01/12/21  1:25 PM   Specimen: Nasopharyngeal Swab  Result Value Ref Range Status   SARS Coronavirus 2 NEGATIVE NEGATIVE Final    Comment: (NOTE) SARS-CoV-2 target nucleic acids are NOT DETECTED.  The SARS-CoV-2 RNA is generally detectable in upper and lower respiratory specimens during the acute phase of infection. Negative results do not preclude SARS-CoV-2 infection, do not rule out co-infections with other pathogens, and should not be used as the sole basis for treatment or other patient management decisions. Negative results must be combined with clinical observations, patient history, and epidemiological information. The expected result is Negative.  Fact Sheet for Patients: SugarRoll.be  Fact Sheet for Healthcare Providers: https://www.woods-mathews.com/  This test is not yet approved or cleared by the [place] FDA and  has been authorized for detection and/or diagnosis of SARS-CoV-2 by FDA under an Emergency Use Authorization (EUA). This EUA will remain  in effect (meaning this test can be used) for the duration of  the COVID-19 declaration under Se ction 564(b)(1) of the Act, 21 U.S.C. section 360bbb-3(b)(1), unless the authorization is terminated or revoked sooner.  Performed at [facility], [place] [address], [place], [place] [address]   Resp Panel by RT-PCR (Flu A&B, Covid) Nasopharyngeal Swab     Status: None   Collection Time: 01/31/21  4:06 PM   Specimen: Nasopharyngeal Swab; Nasopharyngeal(NP) swabs in vial transport medium  Result Value Ref Range Status   SARS Coronavirus 2 by RT PCR NEGATIVE NEGATIVE Final    Comment: (NOTE) SARS-CoV-2 target nucleic acids are NOT DETECTED.  The SARS-CoV-2 RNA is generally detectable in upper respiratory specimens during the acute phase of infection. The lowest concentration of SARS-CoV-2 viral copies this assay can detect is 138 copies/mL. A negative result does not preclude SARS-Cov-2 infection and should not be used as the sole basis for treatment or other patient management decisions. A negative result may occur with  improper specimen collection/handling, submission of specimen other than nasopharyngeal swab, presence of viral mutation(s) within the areas targeted by this assay, and inadequate number of viral copies(<138 copies/mL). A negative result must be combined with clinical observations, patient history, and epidemiological information. The expected result is Negative.  Fact Sheet for Patients:  EntrepreneurPulse.com.au  Fact Sheet for Healthcare Providers:  IncredibleEmployment.be  This test is no t yet approved or cleared by the [place] FDA and  has been authorized for detection and/or diagnosis of SARS-CoV-2 by FDA under an Emergency Use Authorization (EUA). This EUA will remain  in effect (meaning this test can be used) for the duration of the COVID-19 declaration under Section 564(b)(1) of the Act, 21 U.S.C.section 360bbb-3(b)(1), unless the authorization is terminated  or revoked  sooner.       Influenza A by PCR NEGATIVE NEGATIVE Final   Influenza B by PCR NEGATIVE NEGATIVE Final    Comment: (NOTE) The Xpert Xpress SARS-CoV-2/FLU/RSV plus assay is intended as an aid in the diagnosis of influenza from Nasopharyngeal swab specimens and should not be used as a sole basis for treatment. Nasal washings and aspirates are unacceptable for Xpert Xpress SARS-CoV-2/FLU/RSV testing.  Fact Sheet for Patients: EntrepreneurPulse.com.au  Fact Sheet for Healthcare Providers: IncredibleEmployment.be  This test is not yet approved or cleared by the [place] FDA and has been authorized for detection and/or diagnosis of SARS-CoV-2 by FDA under an Emergency Use Authorization (EUA). This EUA will remain  in effect (meaning this test can be used) for the duration of the COVID-19 declaration under Section 564(b)(1) of the Act, 21 U.S.C. section 360bbb-3(b)(1), unless the authorization is terminated or revoked.  Performed at [facility], [address] [place], [place] [address]   Blood culture (routine x 2)     Status: None (Preliminary result)   Collection Time: 01/31/21  5:53 PM   Specimen: BLOOD  Result Value Ref Range Status   Specimen Description BLOOD RIGHT ANTECUBITAL  Final   Special Requests   Final    BOTTLES DRAWN AEROBIC AND ANAEROBIC Blood Culture adequate volume   Culture  Setup Time   Final    GRAM POSITIVE COCCI AEROBIC BOTTLE ONLY CRITICAL VALUE NOTED.  VALUE IS CONSISTENT WITH PREVIOUSLY REPORTED AND CALLED VALUE. Performed at [facility], [address] [place], [place] [address]    Culture [facility] POSITIVE COCCI  Final   Report Status PENDING  Incomplete  Blood culture (routine x 2)     Status: None (Preliminary result)   Collection Time: 01/31/21  5:53 PM   Specimen: BLOOD  Result Value Ref Range Status   Specimen Description BLOOD BLOOD LEFT HAND  Final   Special Requests   Final     BOTTLES DRAWN AEROBIC AND ANAEROBIC Blood Culture adequate volume   Culture  Setup Time   Final    Organism ID to follow GRAM POSITIVE COCCI AEROBIC BOTTLE ONLY CRITICAL RESULT CALLED TO, READ BACK BY AND VERIFIED WITH: Laqueta Carina AT 3762 02/01/21 [place] Performed at [facility], [address] [place], [place] [address]    Culture GRAM POSITIVE COCCI  Final   Report Status PENDING  Incomplete  Blood Culture ID Panel (Reflexed)     Status: Abnormal   Collection Time: 01/31/21  5:53 PM  Result Value Ref Range Status   Enterococcus faecalis NOT DETECTED NOT DETECTED Final   Enterococcus Faecium NOT DETECTED NOT DETECTED Final   Listeria monocytogenes NOT DETECTED NOT DETECTED Final   Staphylococcus species DETECTED (A) NOT DETECTED Final    Comment: CRITICAL RESULT CALLED TO, READ BACK BY AND VERIFIED WITH:  SUSAN WATSON AT 7616 02/01/21 SDR    Staphylococcus aureus (BCID) NOT DETECTED NOT DETECTED Final   Staphylococcus epidermidis DETECTED (A) NOT DETECTED Final    Comment: Methicillin (oxacillin) resistant coagulase negative staphylococcus. Possible blood culture contaminant (unless isolated from more than one blood culture draw or clinical case suggests pathogenicity). No antibiotic treatment is indicated for blood  culture contaminants. CRITICAL RESULT CALLED TO, READ BACK BY AND VERIFIED WITH:  SUSAN WATSON AT 0737 02/01/21 SDR    Staphylococcus lugdunensis NOT DETECTED NOT DETECTED Final   Streptococcus species NOT DETECTED NOT DETECTED Final   Streptococcus agalactiae NOT DETECTED NOT DETECTED Final   Streptococcus pneumoniae NOT DETECTED NOT DETECTED Final   Streptococcus pyogenes NOT DETECTED NOT DETECTED Final   A.calcoaceticus-baumannii NOT DETECTED NOT DETECTED Final   Bacteroides fragilis NOT DETECTED NOT DETECTED Final   Enterobacterales NOT DETECTED NOT DETECTED Final   Enterobacter cloacae complex NOT DETECTED NOT DETECTED Final   Escherichia coli NOT  DETECTED NOT DETECTED Final   Klebsiella aerogenes NOT DETECTED NOT DETECTED Final   Klebsiella oxytoca NOT DETECTED NOT DETECTED Final   Klebsiella pneumoniae NOT DETECTED NOT DETECTED Final   Proteus species NOT DETECTED NOT DETECTED Final   Salmonella species NOT DETECTED NOT DETECTED Final   Serratia marcescens NOT DETECTED NOT DETECTED Final   Haemophilus influenzae  NOT DETECTED NOT DETECTED Final   Neisseria meningitidis NOT DETECTED NOT DETECTED Final   Pseudomonas aeruginosa NOT DETECTED NOT DETECTED Final   Stenotrophomonas maltophilia NOT DETECTED NOT DETECTED Final   Candida albicans NOT DETECTED NOT DETECTED Final   Candida auris NOT DETECTED NOT DETECTED Final   Candida glabrata NOT DETECTED NOT DETECTED Final   Candida krusei NOT DETECTED NOT DETECTED Final   Candida parapsilosis NOT DETECTED NOT DETECTED Final   Candida tropicalis NOT DETECTED NOT DETECTED Final   Cryptococcus neoformans/gattii NOT DETECTED NOT DETECTED Final   Methicillin resistance mecA/C DETECTED (A) NOT DETECTED Final    Comment: CRITICAL RESULT CALLED TO, READ BACK BY AND VERIFIED WITH:  Laqueta Carina AT 1243 02/01/21 SDR Performed at [facility], [address], [place], [place] [address]     IMAGING: CT FOOT LEFT W CONTRAST  Result Date: 02/01/2021 CLINICAL DATA:  Left foot infection. Recent amputation three weeks ago. EXAM: CT OF THE LOWER LEFT EXTREMITY WITH CONTRAST TECHNIQUE: Multidetector CT imaging of the lower left extremity was performed according to the standard protocol following intravenous contrast administration. CONTRAST:  63m OMNIPAQUE IOHEXOL 300 MG/ML  SOLN COMPARISON:  None. FINDINGS: Bones/Joint/Cartilage Prior great toe amputation to the level of the first metatarsal head. The surgical margin is not sharp and there is continued cortical irregularity. Prior second toe amputation to the level of the second metatarsal neck. The surgical margin appears sharp, however there  is a small acute minimally displaced oblique fracture of the second metatarsal neck. Prior third toe amputation. No dislocation. Joint spaces are preserved. No joint effusion. Ligaments Ligaments are suboptimally evaluated by CT. Muscles and Tendons Grossly intact. Soft tissue Soft tissue thickening at the first and second toe amputation sites. No discrete fluid collection. No subcutaneous emphysema. No soft tissue mass. IMPRESSION: 1. Prior great toe amputation to the level of the first metatarsal head. The surgical margin is not sharp and there is continued cortical irregularity, concerning for osteomyelitis. No discrete abscess. 2. Prior second toe amputation to the level of the second metatarsal neck. The surgical margin appears sharp, however there is a small acute minimally displaced oblique fracture of the second metatarsal neck. Electronically Signed   By: WTitus DubinM.D.   On: 02/01/2021 16:11   DG Chest Port 1 View  Result Date: 01/31/2021 CLINICAL DATA:  Dyspnea, syncope EXAM: PORTABLE CHEST 1 VIEW COMPARISON:  07/20/2020, CT 07/10/2020 FINDINGS: There is progressive biapical pulmonary infiltrate, more severe within the left upper lobe as well as coarse infiltrate at the right lung base. This may represent interval progression of disease involving the patient's known underlying sarcoidosis or reflect superimposed acute inflammatory changes related to atypical infection upon underlying interstitial lung disease. Stable left basilar pleural thickening. No pneumothorax or pleural effusion. Cardiac size is within normal limits. Hilar enlargement is noted and relates to central pulmonary arterial enlargement better seen on prior CT examination. Multilevel vertebroplasty again noted. IMPRESSION: Progressive asymmetric apically predominant pulmonary infiltrates, more severe within the left apex. This represents either progression of the patient's underlying sarcoidosis or acute on chronic disease  related to atypical infection in the acute setting. Stable hilar enlargement in keeping with pulmonary arterial hypertension. Electronically Signed   By: AFidela SalisburyMD   On: 01/31/2021 17:27    Assessment:   CDawna Partis a 65y.o. 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 She had recent toe amputation and eval by Dr Lucky Cowboy who feels the site does not appear infected. CT scan shows possible cortical irregularity.  It is unclear to me if the bacteremia is real or contaminant - both bottles listed as drawn at same time on admit. No real obvious etiology of bacteremia and her sxs were respiratory in natures  Recommendations Cont vanco,ctx and azitrho Check TTE Repeat bcx Consider MRI but may show inflammation due to recent surgery so may not be helpful to rule out osteomyelitis.  Thank you very much for allowing me to participate in the care of this patient. Please call with questions.   Cheral Marker. Ola Spurr, MD

## 2021-02-01 NOTE — Progress Notes (Signed)
PHARMACY - PHYSICIAN COMMUNICATION CRITICAL VALUE ALERT - BLOOD CULTURE IDENTIFICATION (BCID)  Stacie [name] is an 65 y.o. female who presented to [facility] on 01/31/2021 with a chief complaint of shortness of breath  Assessment:  Patient with pulmonary sarcoidosis on MTX and prednisone.  4/17 blood cultures with GPC in aerobic bottles of BOTH sets.  BCID = [id number]  Name of physician (or Provider) Contacted: Dr Verlon Au  Current antibiotics: Ceftriaxone/azithromycin  Changes to prescribed antibiotics recommended:  Recommendations accepted by provider - change antibiotics to vancomycin  Results for orders placed or performed during the hospital encounter of 01/31/21  Blood Culture ID Panel (Reflexed) (Collected: 01/31/2021  5:53 PM)  Result Value Ref Range   Enterococcus faecalis NOT DETECTED NOT DETECTED   Enterococcus Faecium NOT DETECTED NOT DETECTED   Listeria monocytogenes NOT DETECTED NOT DETECTED   Staphylococcus species DETECTED (A) NOT DETECTED   Staphylococcus aureus (BCID) NOT DETECTED NOT DETECTED   Staphylococcus epidermidis DETECTED (A) NOT DETECTED   Staphylococcus lugdunensis NOT DETECTED NOT DETECTED   Streptococcus species NOT DETECTED NOT DETECTED   Streptococcus agalactiae NOT DETECTED NOT DETECTED   Streptococcus pneumoniae NOT DETECTED NOT DETECTED   Streptococcus pyogenes NOT DETECTED NOT DETECTED   A.calcoaceticus-baumannii NOT DETECTED NOT DETECTED   Bacteroides fragilis NOT DETECTED NOT DETECTED   Enterobacterales NOT DETECTED NOT DETECTED   Enterobacter cloacae complex NOT DETECTED NOT DETECTED   Escherichia coli NOT DETECTED NOT DETECTED   Klebsiella aerogenes NOT DETECTED NOT DETECTED   Klebsiella oxytoca NOT DETECTED NOT DETECTED   Klebsiella pneumoniae NOT DETECTED NOT DETECTED   Proteus species NOT DETECTED NOT DETECTED   Salmonella species NOT DETECTED NOT DETECTED   Serratia marcescens NOT DETECTED NOT DETECTED   Haemophilus influenzae NOT  DETECTED NOT DETECTED   Neisseria meningitidis NOT DETECTED NOT DETECTED   Pseudomonas aeruginosa NOT DETECTED NOT DETECTED   Stenotrophomonas maltophilia NOT DETECTED NOT DETECTED   Candida albicans NOT DETECTED NOT DETECTED   Candida auris NOT DETECTED NOT DETECTED   Candida glabrata NOT DETECTED NOT DETECTED   Candida krusei NOT DETECTED NOT DETECTED   Candida parapsilosis NOT DETECTED NOT DETECTED   Candida tropicalis NOT DETECTED NOT DETECTED   Cryptococcus neoformans/gattii NOT DETECTED NOT DETECTED   Methicillin resistance mecA/C DETECTED (A) NOT DETECTED    Doreene Eland, PharmD, BCPS.   Work Cell: [phone] 02/01/2021 1:48 PM

## 2021-02-01 NOTE — Progress Notes (Signed)
Patient well-known to me with recent admission for hypoxia with acute on chronic respiratory failure.  Found to have bacteremia and has had a chronic left foot wound for many months.  We performed a first toe amputation and resection of part of the first metatarsal head as well as resection of the second metatarsal head.  Her wound looks clean and healthy.  She has undergone a CT scan of the foot which I have reviewed.  There is some question of the not sharp nature of the first metatarsal head resection concerning for osteomyelitis.  Although this is certainly possible, there is no phlegmon or abscess and that resection was done somewhat piecemeal due to the nature of the wound and not using the oscillating saw on the first metatarsal.  I would continue to monitor this clinically for now would not plan further debridement at this time.  If there is underlying infection, her wound would likely began to deteriorate.  Agree with antibiotic treatment and local wound care for now.  Can follow-up with me in the office in 1 to 2 weeks to recheck the wound.

## 2021-02-01 NOTE — Consult Note (Addendum)
[facility] Nurse Consult Note: Reason for Consult: Consult requested for left foot wound.  Pt had toe amputation surgery by Dr Lucky Cowboy of the vascular team on 3/31.  He has ordered xerform gauze dressings daily, according to the patient.  Wound type: Left anterior foot with full thickness post-op wound; 7X4X.3cm, 50% red, 50% yellow, dry scabbed edges, small amt tan drainage, no odor.  Pt tolerated dressing change without discomfort and denies need for pain meds. Dressing procedure/placement/frequency: Topical treatment orders provided for bedside nurses to perform as was the previously ordered plan of care: Change dressing to left foot Q day as follows: cleanse with moist NS, then cover with double-folded xeroform gauze and abd pad and kerlex. Pt should follow-up with Dr Lucky Cowboy after discharge.  Please re-consult if further assistance is needed.  Thank-you,  Julien Girt MSN, [place], [place], [place], [place]

## 2021-02-02 ENCOUNTER — Inpatient Hospital Stay
Admit: 2021-02-02 | Discharge: 2021-02-02 | Disposition: A | Discharge status: Home / Self Care | Payer: BC Managed Care – PPO | Attending: Infectious Diseases | Admitting: Infectious Diseases

## 2021-02-02 ENCOUNTER — Inpatient Hospital Stay: Payer: BC Managed Care – PPO

## 2021-02-02 LAB — ECHOCARDIOGRAM COMPLETE
AR max vel: 1.75 cm2
AV Area VTI: 1.95 cm2
AV Area mean vel: 1.92 cm2
AV Mean grad: 8.3 mmHg
AV Peak grad: 15.6 mmHg
Ao pk vel: 1.98 m/s
Area-P 1/2: 3.45 cm2
Height: 67 in
S' Lateral: 2.15 cm
Weight: 2716.07 oz

## 2021-02-02 LAB — COMPREHENSIVE METABOLIC PANEL
ALT: 24 U/L (ref 0–44)
AST: 27 U/L (ref 15–41)
Albumin: 2.9 g/dL — ABNORMAL LOW (ref 3.5–5.0)
Alkaline Phosphatase: 82 U/L (ref 38–126)
Anion gap: 10 (ref 5–15)
BUN: 32 mg/dL — ABNORMAL HIGH (ref 8–23)
CO2: 30 mmol/L (ref 22–32)
Calcium: 9.1 mg/dL (ref 8.9–10.3)
Chloride: 102 mmol/L (ref 98–111)
Creatinine, Ser: 1.25 mg/dL — ABNORMAL HIGH (ref 0.44–1.00)
GFR, Estimated: 48 mL/min — ABNORMAL LOW (ref >60)
Glucose, Bld: 163 mg/dL — ABNORMAL HIGH (ref 70–99)
Potassium: 4.3 mmol/L (ref 3.5–5.1)
Sodium: 142 mmol/L (ref 135–145)
Total Bilirubin: 0.5 mg/dL (ref 0.3–1.2)
Total Protein: 6.9 g/dL (ref 6.5–8.1)

## 2021-02-02 LAB — CBC WITH DIFFERENTIAL/PLATELET
Abs Immature Granulocytes: 0.41 10*3/uL — ABNORMAL HIGH (ref 0.00–0.07)
Basophils Absolute: 0 10*3/uL (ref 0.0–0.1)
Basophils Relative: 0 %
Eosinophils Absolute: 0 10*3/uL (ref 0.0–0.5)
Eosinophils Relative: 0 %
HCT: 34.4 % — ABNORMAL LOW (ref 36.0–46.0)
Hemoglobin: 10.9 g/dL — ABNORMAL LOW (ref 12.0–15.0)
Immature Granulocytes: 2 %
Lymphocytes Relative: 2 %
Lymphs Abs: 0.4 10*3/uL — ABNORMAL LOW (ref 0.7–4.0)
MCH: 30 pg (ref 26.0–34.0)
MCHC: 31.7 g/dL (ref 30.0–36.0)
MCV: 94.8 fL (ref 80.0–100.0)
Monocytes Absolute: 0.9 10*3/uL (ref 0.1–1.0)
Monocytes Relative: 4 %
Neutro Abs: 18.6 10*3/uL — ABNORMAL HIGH (ref 1.7–7.7)
Neutrophils Relative %: 92 %
Platelets: 293 10*3/uL (ref 150–400)
RBC: 3.63 MIL/uL — ABNORMAL LOW (ref 3.87–5.11)
RDW: 14.9 % (ref 11.5–15.5)
WBC: 20.3 10*3/uL — ABNORMAL HIGH (ref 4.0–10.5)
nRBC: 0 % (ref 0.0–0.2)

## 2021-02-02 LAB — D-DIMER, QUANTITATIVE: D-Dimer, Quant: 1.7 ug/mL-FEU — ABNORMAL HIGH (ref 0.00–0.50)

## 2021-02-02 LAB — C-REACTIVE PROTEIN: CRP: 14.1 mg/dL — ABNORMAL HIGH (ref <1.0)

## 2021-02-02 LAB — SEDIMENTATION RATE: Sed Rate: >140 mm/hr — ABNORMAL HIGH (ref 0–30)

## 2021-02-02 MED ORDER — KETOROLAC TROMETHAMINE 30 MG/ML IJ SOLN
15.0000 mg | Freq: Once | INTRAMUSCULAR | Status: AC
  Administered 2021-02-02: 15 mg via INTRAVENOUS
  Filled 2021-02-02: qty 1

## 2021-02-02 MED ORDER — HYDROCODONE-HOMATROPINE 5-1.5 MG/5ML PO SYRP
5.0000 mL | ORAL_SOLUTION | ORAL | Status: DC | PRN
  Administered 2021-02-02 – 2021-02-03 (×3): 5 mL via ORAL
  Filled 2021-02-02 (×3): qty 5

## 2021-02-02 MED ORDER — SODIUM CHLORIDE 0.9 % IV SOLN
500.0000 mg | INTRAVENOUS | Status: DC
  Administered 2021-02-02 – 2021-02-03 (×2): 500 mg via INTRAVENOUS
  Filled 2021-02-02 (×3): qty 500

## 2021-02-02 MED ORDER — SENNOSIDES-DOCUSATE SODIUM 8.6-50 MG PO TABS
1.0000 | ORAL_TABLET | Freq: Every day | ORAL | Status: DC
  Administered 2021-02-02: 1 via ORAL
  Filled 2021-02-02 (×2): qty 1

## 2021-02-02 MED ORDER — SODIUM CHLORIDE 0.9 % IV SOLN
2.0000 g | INTRAVENOUS | Status: DC
  Administered 2021-02-02 – 2021-02-04 (×3): 2 g via INTRAVENOUS
  Filled 2021-02-02: qty 20
  Filled 2021-02-02: qty 2
  Filled 2021-02-02 (×2): qty 20

## 2021-02-02 MED ORDER — DICLOFENAC SODIUM 1 % EX GEL
2.0000 g | Freq: Four times a day (QID) | CUTANEOUS | Status: DC
  Administered 2021-02-02 – 2021-02-03 (×5): 2 g via TOPICAL
  Filled 2021-02-02: qty 100

## 2021-02-02 MED ORDER — POLYETHYLENE GLYCOL 3350 17 G PO PACK
17.0000 g | PACK | Freq: Every day | ORAL | Status: DC
  Administered 2021-02-02 – 2021-02-03 (×2): 17 g via ORAL
  Filled 2021-02-02 (×3): qty 1

## 2021-02-02 NOTE — Progress Notes (Signed)
[place] INFECTIOUS DISEASE PROGRESS NOTE Date of Admission:  01/31/2021     ID: Casimiro Needle Mercer is a 65 y.o. female with  Principal Problem:   Acute on chronic respiratory failure with hypoxia (HCC) Active Problems:   Pulmonary sarcoidosis (HCC)   CKD (chronic kidney disease) stage 3, GFR 30-59 ml/min (HCC)   Obstructive sleep apnea   Hypothyroidism, acquired   Pulmonary hypertension ([place])   Sepsis ([place])   History of CVA (cerebrovascular accident)   Subjective: No fevers. FU BCX neg. BCX has 2 different staph species so likely contaminant. Still with cough and back pain from coughing so much   ROS  Eleven systems are reviewed and negative except per hpi  Medications:  Antibiotics Given (last 72 hours)    Date/Time Action Medication Dose Rate   01/31/21 1810 New Bag/Given   cefTRIAXone (ROCEPHIN) 1 g in sodium chloride 0.9 % 100 mL IVPB 1 g 200 mL/hr   01/31/21 1851 New Bag/Given   azithromycin (ZITHROMAX) 500 mg in sodium chloride 0.9 % 250 mL IVPB 500 mg 250 mL/hr   02/01/21 0925 New Bag/Given   azithromycin (ZITHROMAX) 500 mg in sodium chloride 0.9 % 250 mL IVPB 500 mg 250 mL/hr   02/01/21 1024 New Bag/Given   cefTRIAXone (ROCEPHIN) 1 g in sodium chloride 0.9 % 100 mL IVPB 1 g 200 mL/hr   02/01/21 1609 New Bag/Given   vancomycin (VANCOREADY) IVPB 1750 mg/350 mL 1,750 mg 175 mL/hr   02/02/21 1400 New Bag/Given   vancomycin (VANCOREADY) IVPB 1250 mg/250 mL 1,250 mg 166.7 mL/hr     . aspirin EC  81 mg Oral Daily  . atorvastatin  40 mg Oral Daily  . dextromethorphan  15 mg Oral BID  . enoxaparin (LOVENOX) injection  40 mg Subcutaneous Q24H  . escitalopram  20 mg Oral Daily  . folic acid  1 mg Oral Daily  . ipratropium-albuterol  3 mL Nebulization TID  . levothyroxine  137 mcg Oral QAC breakfast  . methotrexate  25 mg Oral Q Tue  . methylPREDNISolone (SOLU-MEDROL) injection  40 mg Intravenous Q12H  . mometasone-formoterol  2 puff Inhalation BID  . potassium  citrate  20 mEq Oral TID WC  . Treprostinil  0.6 mcg Inhalation See admin instructions    Objective: Vital signs in last 24 hours: Temp:  [97.6 F (36.4 C)-98.7 F (37.1 C)] 98.7 F (37.1 C) (04/19 1138) Pulse Rate:  [92-106] 100 (04/19 1138) Resp:  [16-20] 20 (04/19 1138) BP: (122-141)/(65-79) 136/71 (04/19 1138) SpO2:  [97 %-100 %] 97 % (04/19 1138) Weight:  [77 kg] 77 kg (04/19 0551) Constitutional:  oriented to person, place, and time. appears well-developed and well-nourished. No distress. On O2. HENT: [place]/AT, PERRLA, no scleral icterus Mouth/Throat: Oropharynx is clear and moist. No oropharyngeal exudate.  Cardiovascular: Normal rate, regular rhythm and normal heart sounds. Exam reveals no gallop and no friction rub.  No murmur heard.  Pulmonary/Chest: coarse BS bil Neck = supple, no nuchal rigidity Abdominal: Soft. Bowel sounds are normal.  exhibits no distension. There is no tenderness.  Lymphadenopathy: no cervical adenopathy. No axillary adenopathy Neurological: alert and oriented to person, place, and time.  Skin: Skin is warm and dry. No rash noted. No erythema.  Psychiatric: a normal mood and affect.  behavior is normal.   Lab Results Recent Labs    02/01/21 0342 02/02/21 0443  WBC 14.2* 20.3*  HGB 11.4* 10.9*  HCT 34.7* 34.4*  NA 139 142  K 4.3  4.3  CL 98 102  CO2 31 30  BUN 26* 32*  CREATININE 1.22* 1.25*    Microbiology: _0 @ Studies/Results: DG Chest 2 View  Result Date: 02/02/2021 CLINICAL DATA:  Pulmonary sarcoid.  Pneumonia. EXAM: CHEST - 2 VIEW COMPARISON:  01/31/2021 FINDINGS: Chronic interstitial coarsening with hilar architectural distortion and apical volume loss. Fibrotic changes are worse at the left apex. As noted previously there may be superimposed pneumonia. Normal heart size. Stable mediastinal contours. Vertebroplasty of midthoracic levels. IMPRESSION: Sarcoid associated pulmonary fibrosis and history of pneumonia. No progression  since 2 days ago. Electronically Signed   By: Monte Fantasia M.D.   On: 02/02/2021 08:14   CT FOOT LEFT W CONTRAST  Result Date: 02/01/2021 CLINICAL DATA:  Left foot infection. Recent amputation three weeks ago. EXAM: CT OF THE LOWER LEFT EXTREMITY WITH CONTRAST TECHNIQUE: Multidetector CT imaging of the lower left extremity was performed according to the standard protocol following intravenous contrast administration. CONTRAST:  49m OMNIPAQUE IOHEXOL 300 MG/ML  SOLN COMPARISON:  None. FINDINGS: Bones/Joint/Cartilage Prior great toe amputation to the level of the first metatarsal head. The surgical margin is not sharp and there is continued cortical irregularity. Prior second toe amputation to the level of the second metatarsal neck. The surgical margin appears sharp, however there is a small acute minimally displaced oblique fracture of the second metatarsal neck. Prior third toe amputation. No dislocation. Joint spaces are preserved. No joint effusion. Ligaments Ligaments are suboptimally evaluated by CT. Muscles and Tendons Grossly intact. Soft tissue Soft tissue thickening at the first and second toe amputation sites. No discrete fluid collection. No subcutaneous emphysema. No soft tissue mass. IMPRESSION: 1. Prior great toe amputation to the level of the first metatarsal head. The surgical margin is not sharp and there is continued cortical irregularity, concerning for osteomyelitis. No discrete abscess. 2. Prior second toe amputation to the level of the second metatarsal neck. The surgical margin appears sharp, however there is a small acute minimally displaced oblique fracture of the second metatarsal neck. Electronically Signed   By: WTitus DubinM.D.   On: 02/01/2021 16:11   DG Chest Port 1 View  Result Date: 01/31/2021 CLINICAL DATA:  Dyspnea, syncope EXAM: PORTABLE CHEST 1 VIEW COMPARISON:  07/20/2020, CT 07/10/2020 FINDINGS: There is progressive biapical pulmonary infiltrate, more severe  within the left upper lobe as well as coarse infiltrate at the right lung base. This may represent interval progression of disease involving the patient's known underlying sarcoidosis or reflect superimposed acute inflammatory changes related to atypical infection upon underlying interstitial lung disease. Stable left basilar pleural thickening. No pneumothorax or pleural effusion. Cardiac size is within normal limits. Hilar enlargement is noted and relates to central pulmonary arterial enlargement better seen on prior CT examination. Multilevel vertebroplasty again noted. IMPRESSION: Progressive asymmetric apically predominant pulmonary infiltrates, more severe within the left apex. This represents either progression of the patient's underlying sarcoidosis or acute on chronic disease related to atypical infection in the acute setting. Stable hilar enlargement in keeping with pulmonary arterial hypertension. Electronically Signed   By: AFidela SalisburyMD   On: 01/31/2021 17:27   ECHOCARDIOGRAM COMPLETE  Result Date: 02/02/2021    ECHOCARDIOGRAM REPORT   Patient Name:   CDawna PartDate of Exam: 02/02/2021 Medical Rec #:  [id number]        Height:       67.0 in Accession #:    [id number]       Weight:  169.8 lb Date of Birth:  28-May-1956          BSA:          1.886 m Patient Age:    24 years          BP:           122/79 mmHg Patient Gender: F                 HR:           97 bpm. Exam Location:  [facility] Procedure: 2D Echo, Cardiac Doppler, Color Doppler and Strain Analysis Indications:     Bacteremia R78.81  History:         Patient has prior history of Echocardiogram examinations, most                  recent 07/27/2020. Pulmonary HTN and Stroke;                  Signs/Symptoms:Murmur.  Sonographer:     Sherrie Sport RDCS (AE) Referring Phys:  [place] Diagnosing Phys: Nelva Bush MD  Sonographer Comments: Global longitudinal strain was attempted. IMPRESSIONS  1. Left ventricular ejection  fraction, by estimation, is 60 to 65%. The left ventricle has normal function. Left ventricular endocardial border not optimally defined to evaluate regional wall motion. Left ventricular diastolic parameters are consistent with Grade I diastolic dysfunction (impaired relaxation). Elevated left atrial pressure. The average left ventricular global longitudinal strain is -15.4 %. The global longitudinal strain is abnormal.  2. Right ventricular systolic function is normal. The right ventricular size is normal. There is severely elevated pulmonary artery systolic pressure.  3. The mitral valve is normal in structure. Trivial mitral valve regurgitation. No evidence of mitral stenosis.  4. Tricuspid valve regurgitation is moderate.  5. The aortic valve is tricuspid. There is mild thickening of the aortic valve. Aortic valve regurgitation is not visualized. Mild aortic valve sclerosis is present, with no evidence of aortic valve stenosis.  6. Aortic dilatation noted. There is mild dilatation of the ascending aorta, measuring 39 mm.  7. The inferior vena cava is normal in size with <50% respiratory variability, suggesting right atrial pressure of 8 mmHg. Conclusion(s)/Recommendation(s): No obvious vegetation identified, though native valvular disease is present. Consider transesophageal echocardiogram if clinical concern for endocarditis persists. FINDINGS  Left Ventricle: Left ventricular ejection fraction, by estimation, is 60 to 65%. The left ventricle has normal function. Left ventricular endocardial border not optimally defined to evaluate regional wall motion. The average left ventricular global longitudinal strain is -15.4 %. The global longitudinal strain is abnormal. The left ventricular internal cavity size was normal in size. There is borderline left ventricular hypertrophy. Left ventricular diastolic parameters are consistent with Grade I diastolic dysfunction (impaired relaxation). Elevated left atrial  pressure. Right Ventricle: The right ventricular size is normal. No increase in right ventricular wall thickness. Right ventricular systolic function is normal. There is severely elevated pulmonary artery systolic pressure. The tricuspid regurgitant velocity is 3.91 m/s, and with an assumed right atrial pressure of 8 mmHg, the estimated right ventricular systolic pressure is 55.7 mmHg. Left Atrium: Left atrial size was normal in size. Right Atrium: Right atrial size was normal in size. Pericardium: The pericardium was not well visualized. Presence of pericardial fat pad. Mitral Valve: The mitral valve is normal in structure. There is mild thickening of the mitral valve leaflet(s). Mild mitral annular calcification. Trivial mitral valve regurgitation. No evidence of mitral valve  stenosis. Tricuspid Valve: The tricuspid valve is normal in structure. Tricuspid valve regurgitation is moderate. Aortic Valve: The aortic valve is tricuspid. There is mild thickening of the aortic valve. Aortic valve regurgitation is not visualized. Mild aortic valve sclerosis is present, with no evidence of aortic valve stenosis. Aortic valve mean gradient measures 8.3 mmHg. Aortic valve peak gradient measures 15.6 mmHg. Aortic valve area, by VTI measures 1.95 cm. Pulmonic Valve: The pulmonic valve was normal in structure. Pulmonic valve regurgitation is trivial. No evidence of pulmonic stenosis. Aorta: The aortic root is normal in size and structure and aortic dilatation noted. There is mild dilatation of the ascending aorta, measuring 39 mm. Pulmonary Artery: The pulmonary artery is not well seen. Venous: The inferior vena cava is normal in size with less than 50% respiratory variability, suggesting right atrial pressure of 8 mmHg. IAS/Shunts: The interatrial septum was not well visualized.  LEFT VENTRICLE PLAX 2D LVIDd:         3.26 cm  Diastology LVIDs:         2.15 cm  LV e' medial:    5.55 cm/s LV PW:         1.10 cm  LV E/e' medial:   16.1 LV IVS:        0.80 cm  LV e' lateral:   6.20 cm/s LVOT diam:     2.00 cm  LV E/e' lateral: 14.4 LV SV:         68 LV SV Index:   36       2D Longitudinal Strain LVOT Area:     3.14 cm 2D Strain GLS Avg:     -15.4 %                          3D Volume EF:                         3D EF:        59 %                         LV EDV:       103 ml                         LV ESV:       43 ml                         LV SV:        60 ml RIGHT VENTRICLE RV Basal diam:  3.23 cm RV S prime:     13.30 cm/s TAPSE (M-mode): 3.0 cm LEFT ATRIUM             Index       RIGHT ATRIUM           Index LA diam:        3.50 cm 1.86 cm/m  RA Area:     14.00 cm LA Vol (A2C):   24.3 ml 12.88 ml/m RA Volume:   34.80 ml  18.45 ml/m LA Vol (A4C):   24.8 ml 13.15 ml/m LA Biplane Vol: 25.9 ml 13.73 ml/m  AORTIC VALVE                    PULMONIC VALVE AV Area (Vmax):    1.75 cm  PV Vmax:        0.88 m/s AV Area (Vmean):   1.92 cm     PV Peak grad:   3.1 mmHg AV Area (VTI):     1.95 cm     RVOT Peak grad: 4 mmHg AV Vmax:           197.67 cm/s AV Vmean:          134.000 cm/s AV VTI:            0.348 m AV Peak Grad:      15.6 mmHg AV Mean Grad:      8.3 mmHg LVOT Vmax:         110.00 cm/s LVOT Vmean:        81.900 cm/s LVOT VTI:          0.216 m LVOT/AV VTI ratio: 0.62  AORTA Ao Root diam: 3.53 cm MITRAL VALVE                TRICUSPID VALVE MV Area (PHT): 3.45 cm     TR Peak grad:   61.2 mmHg MV Decel Time: 220 msec     TR Vmax:        391.00 cm/s MV E velocity: 89.10 cm/s MV A velocity: 120.00 cm/s  SHUNTS MV E/A ratio:  0.74         Systemic VTI:  0.22 m                             Systemic Diam: 2.00 cm Nelva Bush MD Electronically signed by Nelva Bush MD Signature Date/Time: 02/02/2021/12:58:19 PM    Final     Assessment/Plan: SOLACE MANWARREN is a 65 y.o. 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 She had recent toe amputation and eval by Dr Lucky Cowboy who feels the site does not appear infected. CT scan shows possible cortical irregularity.  4/19 bcx with different species- likely contaminant. Remains with cough. On 2L O2 which is her baseline TTE neg Recommendations Cont ceftriaxone and azitrho WU PNA as per pulm recs If MRSA PCR neg can DC [place]  Thank you very much for the consult. Will follow with you.  Leonel Ramsay   02/02/2021, 2:37 PM

## 2021-02-02 NOTE — Progress Notes (Signed)
*  PRELIMINARY RESULTS* Echocardiogram 2D Echocardiogram has been performed.  Sherrie Sport 02/02/2021, 10:12 AM

## 2021-02-02 NOTE — TOC Initial Note (Signed)
Transition of Care [facility]) - Initial/Assessment Note    Patient Details  Name: Stacie [name] MRN: [id number] Date of Birth: Apr 11, 1956  [facility] CM/SW Contact:    Beverly Sessions, RN Phone Number: 02/02/2021, 3:37 PM  Clinical Narrative:                  Patient admitted from home with respiratory failure Patient states that she lives at home with husband and daughter  PCP Army Melia - Family provides transportation  Denies issues obtaining medications  PT recommending Home health.  Patient in agreement.  States she has used Nurse, learning disability in the past and does not want to use them again. Otherwise does not have a preference of agency.  Referral made [facility] with [place] he is reviewing   Patient states that her husband manages her wound care, and she does not feel like a home health RN indicated for that  Potenitally will need home IV antibiotics  Update:  [place] unable to accept [place] with [facility] reviewing  Expected Discharge Plan: [place] Barriers to Discharge: Continued Medical Work up   Patient Goals and CMS Choice     Choice offered to / list presented to : Patient  Expected Discharge Plan and Services Expected Discharge Plan: [place]   Discharge Planning Services: CM Consult Post Acute Care Choice: [place] arrangements for the past 2 months: [place]: RN,PT [place] Agency: [place] ([place]) Date HH Agency Contacted: 02/02/21   Representative spoke with at [place]: Corene Cornea  Prior Living Arrangements/Services Living arrangements for the past 2 months: [place] Lives with:: Adult Children,Spouse Patient language and need for interpreter reviewed:: Yes Do you feel safe going back to the place where you live?: Yes      Need for Family Participation in Patient Care: Yes (Comment)   Current home  services: DME Criminal Activity/Legal Involvement Pertinent to Current Situation/Hospitalization: No - Comment as needed  Activities of Daily Living Home Assistive Devices/Equipment: None ADL Screening (condition at time of admission) Patient's cognitive ability adequate to safely complete daily activities?: Yes Is the patient deaf or have difficulty hearing?: No Does the patient have difficulty seeing, even when wearing glasses/contacts?: No Does the patient have difficulty concentrating, remembering, or making decisions?: No Patient able to express need for assistance with ADLs?: Yes Does the patient have difficulty dressing or bathing?: No Independently performs ADLs?: Yes (appropriate for developmental age) Does the patient have difficulty walking or climbing stairs?: Yes Weakness of Legs: Both Weakness of Arms/Hands: None  Permission Sought/Granted                  Emotional Assessment       Orientation: : Oriented to Self,Oriented to Place,Oriented to  Time,Oriented to Situation Alcohol / Substance Use: Not Applicable Psych Involvement: No (comment)  Admission diagnosis:  SOB (shortness of breath) [R06.02] Acute on chronic respiratory failure ([place]) [J96.20] Sepsis ([place]) [A41.9] Patient Active Problem List   Diagnosis Date Noted  . Acute on chronic respiratory failure with hypoxia ([place]) 01/31/2021  . History of CVA (cerebrovascular accident) 01/31/2021  . Gangrene of left foot ([place]) 11/03/2020  . Labile blood glucose   . Steroid-induced hyperglycemia   . Thrombocytopenia ([place])   .  Acute blood loss anemia   . Slow transit constipation   . AKI (acute kidney injury) ([place])   . Supplemental oxygen dependent   . Klebsiella pneumoniae sepsis ([place])   . Critical lower limb ischemia ([place])   . Acute renal failure ([place])   . Cerebrovascular accident (CVA) due to embolism of cerebral artery ([place])   . Sepsis ([place]) 07/10/2020  . Pulmonary hypertension ([place]) 12/18/2018  .  Depression, major, recurrent, in partial remission ([place]) 10/22/2018  . Compression fx, thoracic spine ([place]) 12/04/2017  . Hyperlipidemia, mixed 04/21/2017  . Cough variant asthma  vs UACS  02/20/2017  . Hypothyroidism, acquired 03/27/2015  . Essential hypertension 03/27/2015  . Acquired absence of both cervix and uterus 03/27/2015  . H/O renal calculi 03/27/2015  . Allergic rhinitis 01/07/2015  . Obstructive sleep apnea 09/29/2014  . Other malaise and fatigue 06/03/2014  . Asthma, chronic 02/24/2014  . Cough 04/30/2013  . Pulmonary sarcoidosis ([place]) 03/12/2013  . CKD (chronic kidney disease) stage 3, GFR 30-59 ml/min (HCC) 03/12/2013  . Calcium blood increased 05/23/2012   PCP:  Glean Hess, MD Pharmacy:   [id number] [id number]: [phone]: [phone] [facility] [address]Phone: [phone]: [phone]    Social Determinants of Health (SDOH) Interventions    Readmission Risk Interventions Readmission Risk Prevention Plan 07/17/2020  Transportation Screening Complete  PCP or Specialist Appt within 3-5 Days Complete  HRI or Home Care Consult Complete  Palliative Care Screening Complete  Medication Review (RN Care Manager) Complete  Some recent data might be hidden

## 2021-02-02 NOTE — Consult Note (Signed)
Pulmonary Medicine          Date: 02/02/2021,   MRN# [id number] Stacie [name] Mar 30, 1956     AdmissionWeight: 80.1 kg                 CurrentWeight: 50 kg   Referring physician: Dr. Verlon Au   CHIEF COMPLAINT:   Pulmonary sarcoidosis and pulmonary hypertension with high risk immunosuppressive therapy in context of sepsis bacteremia   HISTORY OF PRESENT ILLNESS   This a pleasant 65 year old female with history of chronic respiratory failure, pulmonary sarcoidosis, ILD related pulmonary hypertension with chronic hypoxemia on 2 to 3 L of oxygen at home, previous admission in critical care unit with bacteremia 1 year ago required intubation at that time, also sustained acute CVA during that admission with good residual recovery and subsequent DC home.  Besides that her comorbid history is extensive including stage III CKD, hypothyroidism, nephrolithiasis, peripheral vascular disease requiring multiple surgeries, OSA on CPAP, she came in with worsening respiratory failure symptoms of possible lower respiratory tract infection worsening cough.  Also had presyncopal episodes.  Subjective fevers are reported while at home.  She was found to be afebrile on admission however had significant leukocytosis with hypoxemia in the low 80s on room air.  She had blood cultures done which did show 2 positive for MRSE.  She had pulmonary infiltrates on her chest x-ray which was done on admission.  Pulmonary consultation placed due to patient currently being on significant high risk therapy while infected including 25 mg of methotrexate once weekly as well as twice daily steroids.    PAST MEDICAL HISTORY   Past Medical History:  Diagnosis Date  . Acute respiratory failure with hypoxia ([place])   . Arrhythmia    patient unaware if this is current  . Asthma   . Chronic kidney disease   . Depression   . GERD (gastroesophageal reflux disease)   . Heart murmur   . History of kidney stones   .  HOH (hard of hearing)    wear aids  . Hyperthyroidism   . Hypothyroidism   . IBS (irritable bowel syndrome)   . Pneumonia   . Pulmonary hypertension ([place])   . Sarcoid   . Sarcoidosis   . Seasonal allergies   . Sleep apnea CPAP with O2  . Stroke ([place]) 07/2020   [facility]  . Wears hearing aid in both ears      SURGICAL HISTORY   Past Surgical History:  Procedure Laterality Date  . ABDOMINAL HYSTERECTOMY     partial  . AMPUTATION Left 01/14/2021   Procedure: AMPUTATION RAY (1ST TOE ) ( 2ND METATARSAL HEAD RESECTION);  Surgeon: Algernon Huxley, MD;  Location: [facility];  Service: Vascular;  Laterality: Left;  . CARDIAC CATHETERIZATION  10/18/2018   [facility]  . CATARACT EXTRACTION W/PHACO Left 07/31/2019   Procedure: CATARACT EXTRACTION PHACO AND INTRAOCULAR LENS PLACEMENT (IOC) LEFT 00:51.1  17.9%  9.15;  Surgeon: Leandrew Koyanagi, MD;  Location: [facility];  Service: Ophthalmology;  Laterality: Left;  keep this patient second  . COLON SURGERY     "colon was fused to bladder - operated on both"  . COLONOSCOPY  09/18/2007   diverticuli, no polyps  . COLONOSCOPY  05/26/2010   diverticuli, no polyps  . CYSTOSCOPY WITH STENT PLACEMENT Bilateral 07/14/2020   Procedure: CYSTOSCOPY WITH STENT PLACEMENT, RETROPYLOGRAM;  Surgeon: Billey Co, MD;  Location: [facility];  Service: Urology;  Laterality: Bilateral;  .  CYSTOSCOPY/URETEROSCOPY/HOLMIUM LASER/STENT PLACEMENT Left 02/20/2020   Procedure: CYSTOSCOPY/URETEROSCOPY/LITHOTRIPSY /STENT PLACEMENT;  Surgeon: Hollice Espy, MD;  Location: [facility];  Service: Urology;  Laterality: Left;  . CYSTOSCOPY/URETEROSCOPY/HOLMIUM LASER/STENT PLACEMENT Bilateral 10/02/2020   Procedure: CYSTOSCOPY/URETEROSCOPY/HOLMIUM LASER/STENT PLACEMENT;  Surgeon: Billey Co, MD;  Location: [facility];  Service: Urology;  Laterality: Bilateral;  . EYE SURGERY    . PARS PLANA VITRECTOMY Right 05/20/2015   Procedure: PARS PLANA VITRECTOMY WITH 25 GAUGE,  laser;  Surgeon: Milus Height, MD;  Location: [facility];  Service: Ophthalmology;  Laterality: Right;  . PARTIAL HYSTERECTOMY  1990  . TEE WITHOUT CARDIOVERSION N/A 07/27/2020   Procedure: TRANSESOPHAGEAL ECHOCARDIOGRAM (TEE);  Surgeon: Teodoro Spray, MD;  Location: [facility];  Service: Cardiovascular;  Laterality: N/A;  . TUBAL LIGATION    . WISDOM TOOTH EXTRACTION       FAMILY HISTORY   Family History  Problem Relation Age of Onset  . Allergies Father   . Asthma Father   . Colon cancer Father   . Allergies Brother   . Asthma Brother   . Breast cancer Maternal Grandmother      SOCIAL HISTORY   Social History   Tobacco Use  . Smoking status: Never Smoker  . Smokeless tobacco: Never Used  Vaping Use  . Vaping Use: Never used  Substance Use Topics  . Alcohol use: No  . Drug use: No     MEDICATIONS    Home Medication:    Current Medication:  Current Facility-Administered Medications:  .  0.9 %  sodium chloride infusion, , Intravenous, Continuous, Samtani, Jai-Gurmukh, MD, Last Rate: 75 mL/hr at 02/02/21 0645, New Bag at 02/02/21 0645 .  acetaminophen (TYLENOL) tablet 650 mg, 650 mg, Oral, Q6H PRN, 650 mg at 02/02/21 0333 **OR** acetaminophen (TYLENOL) suppository 650 mg, 650 mg, Rectal, Q6H PRN, Posey Pronto, Vishal R, MD .  albuterol (PROVENTIL) (2.5 MG/3ML) 0.083% nebulizer solution 2.5 mg, 2.5 mg, Inhalation, Q6H PRN, Zada Finders R, MD, 2.5 mg at 01/31/21 2110 .  aspirin EC tablet 81 mg, 81 mg, Oral, Daily, Zada Finders R, MD, 81 mg at 02/01/21 0906 .  atorvastatin (LIPITOR) tablet 40 mg, 40 mg, Oral, Daily, Zada Finders R, MD, 40 mg at 02/01/21 0906 .  chlorpheniramine-HYDROcodone (TUSSIONEX) 10-8 MG/5ML suspension 5 mL, 5 mL, Oral, Q12H PRN, Lenore Cordia, MD, 5 mL at 02/01/21 2013 .  dextromethorphan (DELSYM) 30 MG/5ML liquid 15 mg, 15 mg, Oral, BID, Verlon Au, Jai-Gurmukh, MD, 15 mg at 02/01/21 2120 .  enoxaparin (LOVENOX) injection 40 mg, 40 mg, Subcutaneous,  Q24H, Zada Finders R, MD, 40 mg at 02/01/21 2117 .  escitalopram (LEXAPRO) tablet 20 mg, 20 mg, Oral, Daily, Zada Finders R, MD, 20 mg at 02/01/21 1158 .  folic acid (FOLVITE) tablet 1 mg, 1 mg, Oral, Daily, Samtani, Jai-Gurmukh, MD .  ipratropium-albuterol (DUONEB) 0.5-2.5 (3) MG/3ML nebulizer solution 3 mL, 3 mL, Nebulization, TID, Zada Finders R, MD, 3 mL at 02/01/21 1947 .  levothyroxine (SYNTHROID) tablet 137 mcg, 137 mcg, Oral, QAC breakfast, Lenore Cordia, MD, 137 mcg at 02/02/21 0441 .  methotrexate (RHEUMATREX) tablet 25 mg, 25 mg, Oral, Q Tue, Patel, Vishal R, MD .  methylPREDNISolone sodium succinate (SOLU-MEDROL) 40 mg/mL injection 40 mg, 40 mg, Intravenous, Q12H, Zada Finders R, MD, 40 mg at 02/01/21 2115 .  mometasone-formoterol (DULERA) 100-5 MCG/ACT inhaler 2 puff, 2 puff, Inhalation, BID, Lenore Cordia, MD, 2 puff at 02/01/21 2010 .  ondansetron (ZOFRAN) tablet 4 mg, 4 mg, Oral,  Q6H PRN **OR** ondansetron (ZOFRAN) injection 4 mg, 4 mg, Intravenous, Q6H PRN, Posey Pronto, Vishal R, MD .  potassium citrate (UROCIT-K) SR tablet 20 mEq, 20 mEq, Oral, TID WC, Zada Finders R, MD, 20 mEq at 02/01/21 1605 .  senna-docusate (Senokot-S) tablet 1 tablet, 1 tablet, Oral, QHS PRN, Lenore Cordia, MD .  Treprostinil (TYVASO) inhalation solution 0.6 mcg, 0.6 mcg, Inhalation, See admin instructions, Lenore Cordia, MD .  vancomycin (VANCOREADY) IVPB 1250 mg/250 mL, 1,250 mg, Intravenous, Q24H, Zeigler, Dustin G, RPH    ALLERGIES   Nitrofurantoin, Plaquenil [hydroxychloroquine], Tramadol, and Sulfa antibiotics     REVIEW OF SYSTEMS    Review of Systems:  Gen:  Denies  fever, sweats, chills weigh loss  HEENT: Denies blurred vision, double vision, ear pain, eye pain, hearing loss, nose bleeds, sore throat Cardiac:  No dizziness, chest pain or heaviness, chest tightness,edema Resp:   Denies cough or sputum porduction, shortness of breath,wheezing, hemoptysis,  Gi: Denies swallowing  difficulty, stomach pain, nausea or vomiting, diarrhea, constipation, bowel incontinence Gu:  Denies bladder incontinence, burning urine Ext:   Denies Joint pain, stiffness or swelling Skin: Denies  skin rash, easy bruising or bleeding or hives Endoc:  Denies polyuria, polydipsia , polyphagia or weight change Psych:   Denies depression, insomnia or hallucinations   Other:  All other systems negative   VS: BP 122/79 (BP Location: Right Arm)   Pulse 97   Temp 97.8 F (36.6 C) (Oral)   Resp 18   Ht _0  (1.702 m)   Wt 77 kg   SpO2 97%   BMI 26.59 kg/m      PHYSICAL EXAM    GENERAL:NAD, no fevers, chills, no weakness no fatigue HEAD: Normocephalic, atraumatic.  EYES: Pupils equal, round, reactive to light. Extraocular muscles intact. No scleral icterus.  MOUTH: Moist mucosal membrane. Dentition intact. No abscess noted.  EAR, NOSE, THROAT: Clear without exudates. No external lesions.  NECK: Supple. No thyromegaly. No nodules. No JVD.  PULMONARY: Diffuse coarse rhonchi right sided +wheezes CARDIOVASCULAR: S1 and S2. Regular rate and rhythm. No murmurs, rubs, or gallops. No edema. Pedal pulses 2+ bilaterally.  GASTROINTESTINAL: Soft, nontender, nondistended. No masses. Positive bowel sounds. No hepatosplenomegaly.  MUSCULOSKELETAL: No swelling, clubbing, or edema. Range of motion full in all extremities.  NEUROLOGIC: Cranial nerves II through XII are intact. No gross focal neurological deficits. Sensation intact. Reflexes intact.  SKIN: No ulceration, lesions, rashes, or cyanosis. Skin warm and dry. Turgor intact.  PSYCHIATRIC: Mood, affect within normal limits. The patient is awake, alert and oriented x 3. Insight, judgment intact.       IMAGING    CT FOOT LEFT W CONTRAST  Result Date: 02/01/2021 CLINICAL DATA:  Left foot infection. Recent amputation three weeks ago. EXAM: CT OF THE LOWER LEFT EXTREMITY WITH CONTRAST TECHNIQUE: Multidetector CT imaging of the lower left  extremity was performed according to the standard protocol following intravenous contrast administration. CONTRAST:  31m OMNIPAQUE IOHEXOL 300 MG/ML  SOLN COMPARISON:  None. FINDINGS: Bones/Joint/Cartilage Prior great toe amputation to the level of the first metatarsal head. The surgical margin is not sharp and there is continued cortical irregularity. Prior second toe amputation to the level of the second metatarsal neck. The surgical margin appears sharp, however there is a small acute minimally displaced oblique fracture of the second metatarsal neck. Prior third toe amputation. No dislocation. Joint spaces are preserved. No joint effusion. Ligaments Ligaments are suboptimally evaluated by CT. Muscles and Tendons  Grossly intact. Soft tissue Soft tissue thickening at the first and second toe amputation sites. No discrete fluid collection. No subcutaneous emphysema. No soft tissue mass. IMPRESSION: 1. Prior great toe amputation to the level of the first metatarsal head. The surgical margin is not sharp and there is continued cortical irregularity, concerning for osteomyelitis. No discrete abscess. 2. Prior second toe amputation to the level of the second metatarsal neck. The surgical margin appears sharp, however there is a small acute minimally displaced oblique fracture of the second metatarsal neck. Electronically Signed   By: Titus Dubin M.D.   On: 02/01/2021 16:11   DG Chest Port 1 View  Result Date: 01/31/2021 CLINICAL DATA:  Dyspnea, syncope EXAM: PORTABLE CHEST 1 VIEW COMPARISON:  07/20/2020, CT 07/10/2020 FINDINGS: There is progressive biapical pulmonary infiltrate, more severe within the left upper lobe as well as coarse infiltrate at the right lung base. This may represent interval progression of disease involving the patient's known underlying sarcoidosis or reflect superimposed acute inflammatory changes related to atypical infection upon underlying interstitial lung disease. Stable left  basilar pleural thickening. No pneumothorax or pleural effusion. Cardiac size is within normal limits. Hilar enlargement is noted and relates to central pulmonary arterial enlargement better seen on prior CT examination. Multilevel vertebroplasty again noted. IMPRESSION: Progressive asymmetric apically predominant pulmonary infiltrates, more severe within the left apex. This represents either progression of the patient's underlying sarcoidosis or acute on chronic disease related to atypical infection in the acute setting. Stable hilar enlargement in keeping with pulmonary arterial hypertension. Electronically Signed   By: Fidela Salisbury MD   On: 01/31/2021 17:27      ASSESSMENT/PLAN   Acute on chronic hypoxemic respiratory failure - present on admission - in context of sepsis with MRSE bacteremia x 2 - Infectious disease on case - appreciate input -patient s/p surgical pathology of left foot - acute osteomyelitis + - vascular surgery on case - appreciate input - cxr with worsening interval changes as evidenced above - suggesting ILD sarcoidosis exacerbation - while in sepsis bacteremia may dc methotrexate and use solumedrol only, will need CT high resolution to review parenchyma for better differentiation of infection vs ILD  - COVID19 -negative  - supplemental O2 during my evaluation 2L/min - s/p TTE - report pending  -Respiratory viral panel-negative -serum fungitell -legionella ab -strep pneumoniae ur AG -Histoplasma Ur Ag -sputum resp cultures -AFB sputum expectorated specimen -sputum cytology  -reviewed pertinent imaging with patient today - ESR/RF/Ddimer/CRP to evaluate inflammatory biomarkers -ACE level due to underlying sarcoidosis with possible exacerbation  -Agree with solumedrol 40 bid -DC MTX 25 once weekly while in sepsis with bacteremia  -patient on IVF 75cc/hr, she is euvolemic on examination and I will dc IV fluids due to potential for edema and worsening hypoxemia. -PT/OT  for d/c planning  -please encourage patient to use incentive spirometer few times each hour while hospitalized.     ILD associated Pulmonary Hypertension  -Patient is currently on [place] and is well familiar with device - she has follow up with pulmonary on outpatient set up   -Patient may benefit from pulmonary hypertension work-up outpatient- most recent NT proBNP was 340 -Transthoracic echo has been performed and report is pending -Continue with  PRN diuresis and supplemental oxygen at this time  -Cardiology on case-appreciate input   Pulmonary sarcoidosis -Stage IV CXR with bibasilar fibrosis -Currently on MTX 25 once weekly-we will hold while in acute sepsis with bacteremia -Continue Solu-Medrol 40 twice daily with  transition to p.o. prednisone with slow taper -will obtain CT chest noncontrasted to evaluate lung parenchyma and fibrotic burden -will need outpatient follow-up with full PFTs -Evaluation for BiPAP/noninvasive ventilator rather than CPAP due to restrictive pulmonary physiology with fibrotic changes on CXR this admission.    Obstructive sleep apnea -Continue home CPAP with 3 to 4 L oxygen bleed in with humidification -May change settings as per RT while acutely ill   Thank you for allowing me to participate in the care of this patient.   Patient/Family are satisfied with care plan and all questions have been answered.  This document was prepared using Dragon voice recognition software and may include unintentional dictation errors.     Ottie Glazier, M.D.  Division of [place]

## 2021-02-02 NOTE — Progress Notes (Addendum)
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 difficile OSA  At last office visit at [facility] 3/16 "Wearing oxygen currently at all times Endorses exertional dyspnea orthopnea"  4/11 developed URI symptoms with sick contacts initially around her-symptoms worsened 4/17 increasing SOB increasing cough increasing sputum, subjective fever sweats + posttussive emesis Also developed syncope and was found to be orthostatic on coming to the ED by EMS-Rx Solu-Medrol and DuoNeb SPO2 82% in ED  WBC 21 BUN/creatinine 25/1.2 blood cultures obtained CXR = asymmetric infiltrates in the left apex Rx ceftriaxone azithromycin  Hospital-Problem based course  Sepsis secondary  pneumonia based on infiltrates on CXR in an immunocompromised host on methotrexate, steroids 2/2 bottles MRSE are contaminants respiratory pathogen panel   [-] Follow other testing as per pulmonary Discussed with Dr. Benjaman Pott will narrow to pneumonia coverage only given contaminants and clean appearing wound based on evaluation by Dr. Lucky Cowboy of vascular surgery Stress dosing steroids with Solu-Medrol but convert as per pulmonary Pulmonary sarcoid (home dose 15 mg prednisone daily), OSA, pulmonary hypertension-is on chronic oxygen for the at least the past 3 to 4 months for her own admission Methotrexate DC per pulmonology Dr. Lanney Gins Continue Tyvaso, continue burst of steroids Solu-Medrol 40 every 12 Cough  is worse today she has no sputum although she has pleurisy therefore we have changed Tussionex to Hycodan syrup daily every 4 needed-continue Delsym 15 twice daily Continue albuterol 2.5 every 6 as needed Dulera 2 puffs twice daily and DuoNebs Continue CPAP at night for OSA I have ordered high-resolution CT scan of chest for 4/20 a.m. 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 Kathyrn Sheriff Disposition:  Status is:   The patient will require care spanning > 2 midnights and should be moved to inpatient because: Persistent severe electrolyte disturbances  Patient will need home health with PT and rolling walker which will need to be ordered  Dispo: The patient is from: Home              Anticipated d/c is to: Home              Patient currently is not medically stable to d/c.   Difficult to place patient No  Consultants:   Pulmonology  Vascular surgery  Procedures: CT lower extremity negative for osteomyelitis  Antimicrobials:  Vancomycin changed to Rocephin and azithromycin  Subjective:  Cough is worse she has pleuritic pain she has no fever No chills   Objective: Vitals:   02/02/21 0910 02/02/21 1018 02/02/21 1052 02/02/21 1138  BP:  127/65  136/71  Pulse:  100  100  Resp:  18  20  Temp:    98.7 F (37.1 C)  TempSrc:    Oral  SpO2: 97% 97% 100% 97%  Weight:      Height:  Intake/Output Summary (Last 24 hours) at 02/02/2021 1459 Last data filed at 02/02/2021 1400 Gross per 24 hour  Intake 240 ml  Output --  Net 240 ml   Filed Weights   01/31/21 1558 02/01/21 0419 02/02/21 0551  Weight: 80.1 kg 81.1 kg 77 kg    Examination:  Awake  pleasant Chest clear no added sound diminished posterolaterally Abdomen soft no rebound no guarding Wound not examined today S1-S2 no murmur   Data Reviewed: personally reviewed   WBC 21-->14-->20 Hemoglobin 11-->10.9 Platelet 293 BUN/creatinine 25/1.2-->26/1.2-nephrograms and 32/1.2 CRP is 14 CBG range 1 60-1 90   Radiology Studies: DG Chest 2 View  Result Date: 02/02/2021 CLINICAL DATA:  Pulmonary sarcoid.  Pneumonia. EXAM: CHEST - 2 VIEW COMPARISON:  01/31/2021 FINDINGS: Chronic interstitial coarsening with hilar architectural distortion and apical volume loss. Fibrotic changes are worse at the left apex. As noted previously there may be superimposed pneumonia. Normal heart size. Stable mediastinal contours. Vertebroplasty of midthoracic levels. IMPRESSION: Sarcoid associated pulmonary fibrosis and history of pneumonia. No progression since 2 days ago. Electronically Signed   By: Monte Fantasia M.D.   On: 02/02/2021 08:14   CT FOOT LEFT W CONTRAST  Result Date: 02/01/2021 CLINICAL DATA:  Left foot infection. Recent amputation three weeks ago. EXAM: CT OF THE LOWER LEFT EXTREMITY WITH CONTRAST TECHNIQUE: Multidetector CT imaging of the lower left extremity was performed according to the standard protocol following intravenous contrast administration. CONTRAST:  58m OMNIPAQUE IOHEXOL 300 MG/ML  SOLN COMPARISON:  None. FINDINGS: Bones/Joint/Cartilage Prior great toe amputation to the level of the first metatarsal head. The surgical margin is not sharp and there is continued cortical irregularity. Prior second toe amputation to the level of the second metatarsal neck. The surgical margin appears sharp, however there is a small acute minimally displaced oblique fracture of the second metatarsal neck. Prior third toe amputation. No dislocation. Joint spaces are preserved. No joint effusion. Ligaments Ligaments are suboptimally evaluated by CT. Muscles and Tendons Grossly intact. Soft tissue  Soft tissue thickening at the first and second toe amputation sites. No discrete fluid collection. No subcutaneous emphysema. No soft tissue mass. IMPRESSION: 1. Prior great toe amputation to the level of the first metatarsal head. The surgical margin is not sharp and there is continued cortical irregularity, concerning for osteomyelitis. No discrete abscess. 2. Prior second toe amputation to the level of the second metatarsal neck. The surgical margin appears sharp, however there is a small acute minimally displaced oblique fracture of the second metatarsal neck. Electronically Signed   By: WTitus DubinM.D.   On: 02/01/2021 16:11   DG Chest Port 1 View  Result Date: 01/31/2021 CLINICAL DATA:  Dyspnea, syncope EXAM: PORTABLE CHEST 1 VIEW COMPARISON:  07/20/2020, CT 07/10/2020 FINDINGS: There is progressive biapical pulmonary infiltrate, more severe within the left upper lobe as well as coarse infiltrate at the right lung base. This may represent interval progression of disease involving the patient's known underlying sarcoidosis or reflect superimposed acute inflammatory changes related to atypical infection upon underlying interstitial lung disease. Stable left basilar pleural thickening. No pneumothorax or pleural effusion. Cardiac size is within normal limits. Hilar enlargement is noted and relates to central pulmonary arterial enlargement better seen on prior CT examination. Multilevel vertebroplasty again noted. IMPRESSION: Progressive asymmetric apically predominant pulmonary infiltrates, more severe within the left apex. This represents either progression of the patient's underlying sarcoidosis or acute on chronic disease related to atypical infection in the acute setting. Stable hilar enlargement  in keeping with pulmonary arterial hypertension. Electronically Signed   By: Fidela Salisbury MD   On: 01/31/2021 17:27   ECHOCARDIOGRAM COMPLETE  Result Date: 02/02/2021    ECHOCARDIOGRAM REPORT   Patient  Name:   Stacie [name] Date of Exam: 02/02/2021 Medical Rec #:  [id number]         Height:       67.0 in Accession #:    [id number]        Weight:       169.8 lb Date of Birth:  1956-07-20          BSA:          1.886 m Patient Age:    65 years          BP:           122/79 mmHg Patient Gender: F                 HR:           97 bpm. Exam Location:  [facility] Procedure: 2D Echo, Cardiac Doppler, Color Doppler and Strain Analysis Indications:     Bacteremia R78.81  History:         Patient has prior history of Echocardiogram examinations, most                  recent 07/27/2020. Pulmonary HTN and Stroke;                  Signs/Symptoms:Murmur.  Sonographer:     Sherrie Sport RDCS (AE) Referring Phys:  [place] Diagnosing Phys: Nelva Bush MD  Sonographer Comments: Global longitudinal strain was attempted. IMPRESSIONS  1. Left ventricular ejection fraction, by estimation, is 60 to 65%. The left ventricle has normal function. Left ventricular endocardial border not optimally defined to evaluate regional wall motion. Left ventricular diastolic parameters are consistent with Grade I diastolic dysfunction (impaired relaxation). Elevated left atrial pressure. The average left ventricular global longitudinal strain is -15.4 %. The global longitudinal strain is abnormal.  2. Right ventricular systolic function is normal. The right ventricular size is normal. There is severely elevated pulmonary artery systolic pressure.  3. The mitral valve is normal in structure. Trivial mitral valve regurgitation. No evidence of mitral stenosis.  4. Tricuspid valve regurgitation is moderate.  5. The aortic valve is tricuspid. There is mild thickening of the aortic valve. Aortic valve regurgitation is not visualized. Mild aortic valve sclerosis is present, with no evidence of aortic valve stenosis.  6. Aortic dilatation noted. There is mild dilatation of the ascending aorta, measuring 39 mm.  7. The inferior vena cava is normal  in size with <50% respiratory variability, suggesting right atrial pressure of 8 mmHg. Conclusion(s)/Recommendation(s): No obvious vegetation identified, though native valvular disease is present. Consider transesophageal echocardiogram if clinical concern for endocarditis persists. FINDINGS  Left Ventricle: Left ventricular ejection fraction, by estimation, is 60 to 65%. The left ventricle has normal function. Left ventricular endocardial border not optimally defined to evaluate regional wall motion. The average left ventricular global longitudinal strain is -15.4 %. The global longitudinal strain is abnormal. The left ventricular internal cavity size was normal in size. There is borderline left ventricular hypertrophy. Left ventricular diastolic parameters are consistent with Grade I diastolic dysfunction (impaired relaxation). Elevated left atrial pressure. Right Ventricle: The right ventricular size is normal. No increase in right ventricular wall thickness. Right ventricular systolic function is normal. There is severely elevated pulmonary artery systolic  pressure. The tricuspid regurgitant velocity is 3.91 m/s, and with an assumed right atrial pressure of 8 mmHg, the estimated right ventricular systolic pressure is 77.9 mmHg. Left Atrium: Left atrial size was normal in size. Right Atrium: Right atrial size was normal in size. Pericardium: The pericardium was not well visualized. Presence of pericardial fat pad. Mitral Valve: The mitral valve is normal in structure. There is mild thickening of the mitral valve leaflet(s). Mild mitral annular calcification. Trivial mitral valve regurgitation. No evidence of mitral valve stenosis. Tricuspid Valve: The tricuspid valve is normal in structure. Tricuspid valve regurgitation is moderate. Aortic Valve: The aortic valve is tricuspid. There is mild thickening of the aortic valve. Aortic valve regurgitation is not visualized. Mild aortic valve sclerosis is present, with  no evidence of aortic valve stenosis. Aortic valve mean gradient measures 8.3 mmHg. Aortic valve peak gradient measures 15.6 mmHg. Aortic valve area, by VTI measures 1.95 cm. Pulmonic Valve: The pulmonic valve was normal in structure. Pulmonic valve regurgitation is trivial. No evidence of pulmonic stenosis. Aorta: The aortic root is normal in size and structure and aortic dilatation noted. There is mild dilatation of the ascending aorta, measuring 39 mm. Pulmonary Artery: The pulmonary artery is not well seen. Venous: The inferior vena cava is normal in size with less than 50% respiratory variability, suggesting right atrial pressure of 8 mmHg. IAS/Shunts: The interatrial septum was not well visualized.  LEFT VENTRICLE PLAX 2D LVIDd:         3.26 cm  Diastology LVIDs:         2.15 cm  LV e' medial:    5.55 cm/s LV PW:         1.10 cm  LV E/e' medial:  16.1 LV IVS:        0.80 cm  LV e' lateral:   6.20 cm/s LVOT diam:     2.00 cm  LV E/e' lateral: 14.4 LV SV:         68 LV SV Index:   36       2D Longitudinal Strain LVOT Area:     3.14 cm 2D Strain GLS Avg:     -15.4 %                          3D Volume EF:                         3D EF:        59 %                         LV EDV:       103 ml                         LV ESV:       43 ml                         LV SV:        60 ml RIGHT VENTRICLE RV Basal diam:  3.23 cm RV S prime:     13.30 cm/s TAPSE (M-mode): 3.0 cm LEFT ATRIUM             Index       RIGHT ATRIUM           Index LA diam:  3.50 cm 1.86 cm/m  RA Area:     14.00 cm LA Vol (A2C):   24.3 ml 12.88 ml/m RA Volume:   34.80 ml  18.45 ml/m LA Vol (A4C):   24.8 ml 13.15 ml/m LA Biplane Vol: 25.9 ml 13.73 ml/m  AORTIC VALVE                    PULMONIC VALVE AV Area (Vmax):    1.75 cm     PV Vmax:        0.88 m/s AV Area (Vmean):   1.92 cm     PV Peak grad:   3.1 mmHg AV Area (VTI):     1.95 cm     RVOT Peak grad: 4 mmHg AV Vmax:           197.67 cm/s AV Vmean:          134.000 cm/s AV VTI:             0.348 m AV Peak Grad:      15.6 mmHg AV Mean Grad:      8.3 mmHg LVOT Vmax:         110.00 cm/s LVOT Vmean:        81.900 cm/s LVOT VTI:          0.216 m LVOT/AV VTI ratio: 0.62  AORTA Ao Root diam: 3.53 cm MITRAL VALVE                TRICUSPID VALVE MV Area (PHT): 3.45 cm     TR Peak grad:   61.2 mmHg MV Decel Time: 220 msec     TR Vmax:        391.00 cm/s MV E velocity: 89.10 cm/s MV A velocity: 120.00 cm/s  SHUNTS MV E/A ratio:  0.74         Systemic VTI:  0.22 m                             Systemic Diam: 2.00 cm Nelva Bush MD Electronically signed by Nelva Bush MD Signature Date/Time: 02/02/2021/12:58:19 PM    Final      Scheduled Meds: . aspirin EC  81 mg Oral Daily  . atorvastatin  40 mg Oral Daily  . dextromethorphan  15 mg Oral BID  . enoxaparin (LOVENOX) injection  40 mg Subcutaneous Q24H  . escitalopram  20 mg Oral Daily  . folic acid  1 mg Oral Daily  . ipratropium-albuterol  3 mL Nebulization TID  . levothyroxine  137 mcg Oral QAC breakfast  . methylPREDNISolone (SOLU-MEDROL) injection  40 mg Intravenous Q12H  . mometasone-formoterol  2 puff Inhalation BID  . polyethylene glycol  17 g Oral Daily  . potassium citrate  20 mEq Oral TID WC  . senna-docusate  1 tablet Oral QHS  . Treprostinil  0.6 mcg Inhalation See admin instructions   Continuous Infusions: . azithromycin    . cefTRIAXone (ROCEPHIN)  IV    . vancomycin 1,250 mg (02/02/21 1400)     LOS: 1 day   Time spent: 65 minutes including care coordination time between consultant and discussion with them  Nita Sells, MD [facility] To contact the attending provider between 7A-7P or the covering provider during after hours 7P-7A, please log into the web site [email or web address] and access using universal  password for that web site. If you do not have the password, please call the hospital  operator.  02/02/2021, 2:59 PM

## 2021-02-03 ENCOUNTER — Inpatient Hospital Stay: Payer: BC Managed Care – PPO

## 2021-02-03 DIAGNOSIS — J189 Pneumonia, unspecified organism: Secondary | ICD-10-CM

## 2021-02-03 DIAGNOSIS — D860 Sarcoidosis of lung: Secondary | ICD-10-CM

## 2021-02-03 DIAGNOSIS — N183 Chronic kidney disease, stage 3 unspecified: Secondary | ICD-10-CM

## 2021-02-03 DIAGNOSIS — G4733 Obstructive sleep apnea (adult) (pediatric): Secondary | ICD-10-CM

## 2021-02-03 LAB — CBC WITH DIFFERENTIAL/PLATELET
Abs Immature Granulocytes: 0.29 10*3/uL — ABNORMAL HIGH (ref 0.00–0.07)
Basophils Absolute: 0.1 10*3/uL (ref 0.0–0.1)
Basophils Relative: 0 %
Eosinophils Absolute: 0 10*3/uL (ref 0.0–0.5)
Eosinophils Relative: 0 %
HCT: 33.3 % — ABNORMAL LOW (ref 36.0–46.0)
Hemoglobin: 10.6 g/dL — ABNORMAL LOW (ref 12.0–15.0)
Immature Granulocytes: 1 %
Lymphocytes Relative: 2 %
Lymphs Abs: 0.4 10*3/uL — ABNORMAL LOW (ref 0.7–4.0)
MCH: 29.8 pg (ref 26.0–34.0)
MCHC: 31.8 g/dL (ref 30.0–36.0)
MCV: 93.5 fL (ref 80.0–100.0)
Monocytes Absolute: 0.9 10*3/uL (ref 0.1–1.0)
Monocytes Relative: 4 %
Neutro Abs: 19.9 10*3/uL — ABNORMAL HIGH (ref 1.7–7.7)
Neutrophils Relative %: 93 %
Platelets: 299 10*3/uL (ref 150–400)
RBC: 3.56 MIL/uL — ABNORMAL LOW (ref 3.87–5.11)
RDW: 14.8 % (ref 11.5–15.5)
WBC: 21.6 10*3/uL — ABNORMAL HIGH (ref 4.0–10.5)
nRBC: 0.1 % (ref 0.0–0.2)

## 2021-02-03 LAB — COMPREHENSIVE METABOLIC PANEL
ALT: 28 U/L (ref 0–44)
AST: 33 U/L (ref 15–41)
Albumin: 2.7 g/dL — ABNORMAL LOW (ref 3.5–5.0)
Alkaline Phosphatase: 81 U/L (ref 38–126)
Anion gap: 12 (ref 5–15)
BUN: 34 mg/dL — ABNORMAL HIGH (ref 8–23)
CO2: 27 mmol/L (ref 22–32)
Calcium: 9 mg/dL (ref 8.9–10.3)
Chloride: 104 mmol/L (ref 98–111)
Creatinine, Ser: 1.11 mg/dL — ABNORMAL HIGH (ref 0.44–1.00)
GFR, Estimated: 55 mL/min — ABNORMAL LOW (ref >60)
Glucose, Bld: 163 mg/dL — ABNORMAL HIGH (ref 70–99)
Potassium: 4.3 mmol/L (ref 3.5–5.1)
Sodium: 143 mmol/L (ref 135–145)
Total Bilirubin: 0.6 mg/dL (ref 0.3–1.2)
Total Protein: 6.6 g/dL (ref 6.5–8.1)

## 2021-02-03 LAB — LEGIONELLA PNEUMOPHILA TOTAL AB: Legionella Pneumo Total Ab: <0.91 OD ratio (ref 0.00–0.90)

## 2021-02-03 LAB — ANGIOTENSIN CONVERTING ENZYME: Angiotensin-Converting Enzyme: 17 U/L (ref 14–82)

## 2021-02-03 LAB — RHEUMATOID FACTOR: Rheumatoid fact SerPl-aCnc: 16.1 IU/mL — ABNORMAL HIGH (ref <14.0)

## 2021-02-03 LAB — MRSA PCR SCREENING: MRSA by PCR: NEGATIVE

## 2021-02-03 LAB — HISTOPLASMA ANTIGEN, URINE: Histoplasma Antigen, urine: <0.5 (ref <0.5)

## 2021-02-03 LAB — PROCALCITONIN: Procalcitonin: 0.71 ng/mL

## 2021-02-03 MED ORDER — VANCOMYCIN HCL 1250 MG/250ML IV SOLN
1250.0000 mg | INTRAVENOUS | Status: DC
  Filled 2021-02-03: qty 250

## 2021-02-03 MED ORDER — APIXABAN 2.5 MG PO TABS: 2.5000 mg | ORAL_TABLET | Freq: Two times a day (BID) | ORAL | Status: DC

## 2021-02-03 MED ORDER — GADOBUTROL 1 MMOL/ML IV SOLN
8.0000 mL | Freq: Once | INTRAVENOUS | Status: AC | PRN
  Administered 2021-02-03: 8 mL via INTRAVENOUS

## 2021-02-03 NOTE — Progress Notes (Addendum)
Pulmonary Medicine          Date: 02/03/2021,   MRN# [id number] OLIVE ZMUDA 05-07-1956     AdmissionWeight: 80.1 kg                 CurrentWeight: 81.8 kg   Referring physician: Dr. Verlon Au   CHIEF COMPLAINT:   Pulmonary sarcoidosis and pulmonary hypertension with high risk immunosuppressive therapy in context of sepsis bacteremia   HISTORY OF PRESENT ILLNESS   This a pleasant 65 year old female with history of chronic respiratory failure, pulmonary sarcoidosis, ILD related pulmonary hypertension with chronic hypoxemia on 2 to 3 L of oxygen at home, previous admission in critical care unit with bacteremia 1 year ago required intubation at that time, also sustained acute CVA during that admission with good residual recovery and subsequent DC home.  Besides that her comorbid history is extensive including stage III CKD, hypothyroidism, nephrolithiasis, peripheral vascular disease requiring multiple surgeries, OSA on CPAP, she came in with worsening respiratory failure symptoms of possible lower respiratory tract infection worsening cough.  Also had presyncopal episodes.  Subjective fevers are reported while at home.  She was found to be afebrile on admission however had significant leukocytosis with hypoxemia in the low 80s on room air.  She had blood cultures done which did show 2 positive for MRSE.  She had pulmonary infiltrates on her chest x-ray which was done on admission.  Pulmonary consultation placed due to patient currently being on significant high risk therapy while infected including 25 mg of methotrexate once weekly as well as twice daily steroids.   02/03/21- s/p HRCT - significant vericose and sacular bronchiectasis worse at lingular and RML segments likely as a result of fibrotic sarcoidosis. There is honeycombing changes consistent with fibrosis at bases worse at right but overall mild to moderate severity,no pleural effusion, chronic bronchitic changes  throughout , importantly there is absence of consolidated infiltrate to suggest ongoing active pneumonia but rather slow indolent process mostly due to sarcoid and bronchiectasis, there is no mass or exophytic lesion.      PAST MEDICAL HISTORY   Past Medical History:  Diagnosis Date  . Acute respiratory failure with hypoxia ([place])   . Arrhythmia    patient unaware if this is current  . Asthma   . Chronic kidney disease   . Depression   . GERD (gastroesophageal reflux disease)   . Heart murmur   . History of kidney stones   . HOH (hard of hearing)    wear aids  . Hyperthyroidism   . Hypothyroidism   . IBS (irritable bowel syndrome)   . Pneumonia   . Pulmonary hypertension ([place])   . Sarcoid   . Sarcoidosis   . Seasonal allergies   . Sleep apnea CPAP with O2  . Stroke ([place]) 07/2020   [facility]  . Wears hearing aid in both ears      SURGICAL HISTORY   Past Surgical History:  Procedure Laterality Date  . ABDOMINAL HYSTERECTOMY     partial  . AMPUTATION Left 01/14/2021   Procedure: AMPUTATION RAY (1ST TOE ) ( 2ND METATARSAL HEAD RESECTION);  Surgeon: Algernon Huxley, MD;  Location: [facility];  Service: Vascular;  Laterality: Left;  . CARDIAC CATHETERIZATION  10/18/2018   [facility]  . CATARACT EXTRACTION W/PHACO Left 07/31/2019   Procedure: CATARACT EXTRACTION PHACO AND INTRAOCULAR LENS PLACEMENT (IOC) LEFT 00:51.1  17.9%  9.15;  Surgeon: Leandrew Koyanagi, MD;  Location: [facility];  Service: Ophthalmology;  Laterality: Left;  keep this patient second  . COLON SURGERY     "colon was fused to bladder - operated on both"  . COLONOSCOPY  09/18/2007   diverticuli, no polyps  . COLONOSCOPY  05/26/2010   diverticuli, no polyps  . CYSTOSCOPY WITH STENT PLACEMENT Bilateral 07/14/2020   Procedure: CYSTOSCOPY WITH STENT PLACEMENT, RETROPYLOGRAM;  Surgeon: Billey Co, MD;  Location: [facility];  Service: Urology;  Laterality: Bilateral;  . CYSTOSCOPY/URETEROSCOPY/HOLMIUM  LASER/STENT PLACEMENT Left 02/20/2020   Procedure: CYSTOSCOPY/URETEROSCOPY/LITHOTRIPSY /STENT PLACEMENT;  Surgeon: Hollice Espy, MD;  Location: [facility];  Service: Urology;  Laterality: Left;  . CYSTOSCOPY/URETEROSCOPY/HOLMIUM LASER/STENT PLACEMENT Bilateral 10/02/2020   Procedure: CYSTOSCOPY/URETEROSCOPY/HOLMIUM LASER/STENT PLACEMENT;  Surgeon: Billey Co, MD;  Location: [facility];  Service: Urology;  Laterality: Bilateral;  . EYE SURGERY    . PARS PLANA VITRECTOMY Right 05/20/2015   Procedure: PARS PLANA VITRECTOMY WITH 25 GAUGE, laser;  Surgeon: Milus Height, MD;  Location: [facility];  Service: Ophthalmology;  Laterality: Right;  . PARTIAL HYSTERECTOMY  1990  . TEE WITHOUT CARDIOVERSION N/A 07/27/2020   Procedure: TRANSESOPHAGEAL ECHOCARDIOGRAM (TEE);  Surgeon: Teodoro Spray, MD;  Location: [facility];  Service: Cardiovascular;  Laterality: N/A;  . TUBAL LIGATION    . WISDOM TOOTH EXTRACTION       FAMILY HISTORY   Family History  Problem Relation Age of Onset  . Allergies Father   . Asthma Father   . Colon cancer Father   . Allergies Brother   . Asthma Brother   . Breast cancer Maternal Grandmother      SOCIAL HISTORY   Social History   Tobacco Use  . Smoking status: Never Smoker  . Smokeless tobacco: Never Used  Vaping Use  . Vaping Use: Never used  Substance Use Topics  . Alcohol use: No  . Drug use: No     MEDICATIONS    Home Medication:    Current Medication:  Current Facility-Administered Medications:  .  acetaminophen (TYLENOL) tablet 650 mg, 650 mg, Oral, Q6H PRN, 650 mg at 02/02/21 1152 **OR** acetaminophen (TYLENOL) suppository 650 mg, 650 mg, Rectal, Q6H PRN, Posey Pronto, Vishal R, MD .  albuterol (PROVENTIL) (2.5 MG/3ML) 0.083% nebulizer solution 2.5 mg, 2.5 mg, Inhalation, Q6H PRN, Zada Finders R, MD, 2.5 mg at 02/02/21 1050 .  aspirin EC tablet 81 mg, 81 mg, Oral, Daily, Zada Finders R, MD, 81 mg at 02/02/21 1019 .  atorvastatin (LIPITOR)  tablet 40 mg, 40 mg, Oral, Daily, Posey Pronto, Vishal R, MD, 40 mg at 02/02/21 1019 .  azithromycin (ZITHROMAX) 500 mg in sodium chloride 0.9 % 250 mL IVPB, 500 mg, Intravenous, Q24H, Leonel Ramsay, MD, Stopped at 02/02/21 1900 .  cefTRIAXone (ROCEPHIN) 2 g in sodium chloride 0.9 % 100 mL IVPB, 2 g, Intravenous, Q24H, Leonel Ramsay, MD, Stopped at 02/02/21 1716 .  dextromethorphan (DELSYM) 30 MG/5ML liquid 15 mg, 15 mg, Oral, BID, Verlon Au, Jai-Gurmukh, MD, 15 mg at 02/02/21 2010 .  diclofenac Sodium (VOLTAREN) 1 % topical gel 2 g, 2 g, Topical, QID, [place], Warnell Bureau, MD, 2 g at 02/02/21 2017 .  enoxaparin (LOVENOX) injection 40 mg, 40 mg, Subcutaneous, Q24H, Zada Finders R, MD, 40 mg at 02/02/21 2014 .  escitalopram (LEXAPRO) tablet 20 mg, 20 mg, Oral, Daily, Zada Finders R, MD, 20 mg at 02/02/21 1131 .  folic acid (FOLVITE) tablet 1 mg, 1 mg, Oral, Daily, Samtani, Jai-Gurmukh, MD, 1 mg at 02/02/21 1019 .  HYDROcodone-homatropine (HYCODAN)  5-1.5 MG/5ML syrup 5 mL, 5 mL, Oral, Q4H PRN, Nita Sells, MD, 5 mL at 02/02/21 1513 .  ipratropium-albuterol (DUONEB) 0.5-2.5 (3) MG/3ML nebulizer solution 3 mL, 3 mL, Nebulization, TID, Zada Finders R, MD, 3 mL at 02/02/21 2024 .  levothyroxine (SYNTHROID) tablet 137 mcg, 137 mcg, Oral, QAC breakfast, Lenore Cordia, MD, 137 mcg at 02/03/21 0506 .  methylPREDNISolone sodium succinate (SOLU-MEDROL) 40 mg/mL injection 40 mg, 40 mg, Intravenous, Q12H, Zada Finders R, MD, 40 mg at 02/02/21 2010 .  mometasone-formoterol (DULERA) 100-5 MCG/ACT inhaler 2 puff, 2 puff, Inhalation, BID, Lenore Cordia, MD, 2 puff at 02/02/21 2015 .  ondansetron (ZOFRAN) tablet 4 mg, 4 mg, Oral, Q6H PRN **OR** ondansetron (ZOFRAN) injection 4 mg, 4 mg, Intravenous, Q6H PRN, Patel, Vishal R, MD .  polyethylene glycol (MIRALAX / GLYCOLAX) packet 17 g, 17 g, Oral, Daily, Verlon Au, Jai-Gurmukh, MD, 17 g at 02/02/21 1632 .  potassium citrate (UROCIT-K) SR tablet 20 mEq, 20  mEq, Oral, TID WC, Zada Finders R, MD, 20 mEq at 02/02/21 1629 .  senna-docusate (Senokot-S) tablet 1 tablet, 1 tablet, Oral, QHS PRN, Zada Finders R, MD .  senna-docusate (Senokot-S) tablet 1 tablet, 1 tablet, Oral, QHS, Nita Sells, MD, 1 tablet at 02/02/21 2009 .  Treprostinil (TYVASO) inhalation solution 0.6 mcg, 0.6 mcg, Inhalation, See admin instructions, Lenore Cordia, MD .  vancomycin (VANCOREADY) IVPB 1250 mg/250 mL, 1,250 mg, Intravenous, Q24H, Berton Mount, RPH, Last Rate: 166.7 mL/hr at 02/02/21 1400, 1,250 mg at 02/02/21 1400    ALLERGIES   Nitrofurantoin, Plaquenil [hydroxychloroquine], Tramadol, and Sulfa antibiotics     REVIEW OF SYSTEMS    Review of Systems:  Gen:  Denies  fever, sweats, chills weigh loss  HEENT: Denies blurred vision, double vision, ear pain, eye pain, hearing loss, nose bleeds, sore throat Cardiac:  No dizziness, chest pain or heaviness, chest tightness,edema Resp:   Denies cough or sputum porduction, shortness of breath,wheezing, hemoptysis,  Gi: Denies swallowing difficulty, stomach pain, nausea or vomiting, diarrhea, constipation, bowel incontinence Gu:  Denies bladder incontinence, burning urine Ext:   Denies Joint pain, stiffness or swelling Skin: Denies  skin rash, easy bruising or bleeding or hives Endoc:  Denies polyuria, polydipsia , polyphagia or weight change Psych:   Denies depression, insomnia or hallucinations   Other:  All other systems negative   VS: BP (!) 151/79   Pulse 89   Temp 98 F (36.7 C) (Oral)   Resp 20   Ht _0  (1.702 m)   Wt 81.8 kg   SpO2 99%   BMI 28.24 kg/m      PHYSICAL EXAM    GENERAL:NAD, no fevers, chills, no weakness no fatigue HEAD: Normocephalic, atraumatic.  EYES: Pupils equal, round, reactive to light. Extraocular muscles intact. No scleral icterus.  MOUTH: Moist mucosal membrane. Dentition intact. No abscess noted.  EAR, NOSE, THROAT: Clear without exudates. No  external lesions.  NECK: Supple. No thyromegaly. No nodules. No JVD.  PULMONARY: Diffuse coarse rhonchi right sided +wheezes CARDIOVASCULAR: S1 and S2. Regular rate and rhythm. No murmurs, rubs, or gallops. No edema. Pedal pulses 2+ bilaterally.  GASTROINTESTINAL: Soft, nontender, nondistended. No masses. Positive bowel sounds. No hepatosplenomegaly.  MUSCULOSKELETAL: No swelling, clubbing, or edema. Range of motion full in all extremities.  NEUROLOGIC: Cranial nerves II through XII are intact. No gross focal neurological deficits. Sensation intact. Reflexes intact.  SKIN: No ulceration, lesions, rashes, or cyanosis. Skin warm and dry. Turgor intact.  PSYCHIATRIC:  Mood, affect within normal limits. The patient is awake, alert and oriented x 3. Insight, judgment intact.       IMAGING    DG Chest 2 View  Result Date: 02/02/2021 CLINICAL DATA:  Pulmonary sarcoid.  Pneumonia. EXAM: CHEST - 2 VIEW COMPARISON:  01/31/2021 FINDINGS: Chronic interstitial coarsening with hilar architectural distortion and apical volume loss. Fibrotic changes are worse at the left apex. As noted previously there may be superimposed pneumonia. Normal heart size. Stable mediastinal contours. Vertebroplasty of midthoracic levels. IMPRESSION: Sarcoid associated pulmonary fibrosis and history of pneumonia. No progression since 2 days ago. Electronically Signed   By: Monte Fantasia M.D.   On: 02/02/2021 08:14   CT FOOT LEFT W CONTRAST  Result Date: 02/01/2021 CLINICAL DATA:  Left foot infection. Recent amputation three weeks ago. EXAM: CT OF THE LOWER LEFT EXTREMITY WITH CONTRAST TECHNIQUE: Multidetector CT imaging of the lower left extremity was performed according to the standard protocol following intravenous contrast administration. CONTRAST:  63m OMNIPAQUE IOHEXOL 300 MG/ML  SOLN COMPARISON:  None. FINDINGS: Bones/Joint/Cartilage Prior great toe amputation to the level of the first metatarsal head. The surgical margin  is not sharp and there is continued cortical irregularity. Prior second toe amputation to the level of the second metatarsal neck. The surgical margin appears sharp, however there is a small acute minimally displaced oblique fracture of the second metatarsal neck. Prior third toe amputation. No dislocation. Joint spaces are preserved. No joint effusion. Ligaments Ligaments are suboptimally evaluated by CT. Muscles and Tendons Grossly intact. Soft tissue Soft tissue thickening at the first and second toe amputation sites. No discrete fluid collection. No subcutaneous emphysema. No soft tissue mass. IMPRESSION: 1. Prior great toe amputation to the level of the first metatarsal head. The surgical margin is not sharp and there is continued cortical irregularity, concerning for osteomyelitis. No discrete abscess. 2. Prior second toe amputation to the level of the second metatarsal neck. The surgical margin appears sharp, however there is a small acute minimally displaced oblique fracture of the second metatarsal neck. Electronically Signed   By: WTitus DubinM.D.   On: 02/01/2021 16:11   DG Chest Port 1 View  Result Date: 01/31/2021 CLINICAL DATA:  Dyspnea, syncope EXAM: PORTABLE CHEST 1 VIEW COMPARISON:  07/20/2020, CT 07/10/2020 FINDINGS: There is progressive biapical pulmonary infiltrate, more severe within the left upper lobe as well as coarse infiltrate at the right lung base. This may represent interval progression of disease involving the patient's known underlying sarcoidosis or reflect superimposed acute inflammatory changes related to atypical infection upon underlying interstitial lung disease. Stable left basilar pleural thickening. No pneumothorax or pleural effusion. Cardiac size is within normal limits. Hilar enlargement is noted and relates to central pulmonary arterial enlargement better seen on prior CT examination. Multilevel vertebroplasty again noted. IMPRESSION: Progressive asymmetric  apically predominant pulmonary infiltrates, more severe within the left apex. This represents either progression of the patient's underlying sarcoidosis or acute on chronic disease related to atypical infection in the acute setting. Stable hilar enlargement in keeping with pulmonary arterial hypertension. Electronically Signed   By: AFidela SalisburyMD   On: 01/31/2021 17:27   ECHOCARDIOGRAM COMPLETE  Result Date: 02/02/2021    ECHOCARDIOGRAM REPORT   Patient Name:   CDawna PartDate of Exam: 02/02/2021 Medical Rec #:  [id number]        Height:       67.0 in Accession #:    [id number]  Weight:       169.8 lb Date of Birth:  1956/09/24          BSA:          1.886 m Patient Age:    34 years          BP:           122/79 mmHg Patient Gender: F                 HR:           97 bpm. Exam Location:  [facility] Procedure: 2D Echo, Cardiac Doppler, Color Doppler and Strain Analysis Indications:     Bacteremia R78.81  History:         Patient has prior history of Echocardiogram examinations, most                  recent 07/27/2020. Pulmonary HTN and Stroke;                  Signs/Symptoms:Murmur.  Sonographer:     Sherrie Sport RDCS (AE) Referring Phys:  [place] Diagnosing Phys: Nelva Bush MD  Sonographer Comments: Global longitudinal strain was attempted. IMPRESSIONS  1. Left ventricular ejection fraction, by estimation, is 60 to 65%. The left ventricle has normal function. Left ventricular endocardial border not optimally defined to evaluate regional wall motion. Left ventricular diastolic parameters are consistent with Grade I diastolic dysfunction (impaired relaxation). Elevated left atrial pressure. The average left ventricular global longitudinal strain is -15.4 %. The global longitudinal strain is abnormal.  2. Right ventricular systolic function is normal. The right ventricular size is normal. There is severely elevated pulmonary artery systolic pressure.  3. The mitral valve is normal in  structure. Trivial mitral valve regurgitation. No evidence of mitral stenosis.  4. Tricuspid valve regurgitation is moderate.  5. The aortic valve is tricuspid. There is mild thickening of the aortic valve. Aortic valve regurgitation is not visualized. Mild aortic valve sclerosis is present, with no evidence of aortic valve stenosis.  6. Aortic dilatation noted. There is mild dilatation of the ascending aorta, measuring 39 mm.  7. The inferior vena cava is normal in size with <50% respiratory variability, suggesting right atrial pressure of 8 mmHg. Conclusion(s)/Recommendation(s): No obvious vegetation identified, though native valvular disease is present. Consider transesophageal echocardiogram if clinical concern for endocarditis persists. FINDINGS  Left Ventricle: Left ventricular ejection fraction, by estimation, is 60 to 65%. The left ventricle has normal function. Left ventricular endocardial border not optimally defined to evaluate regional wall motion. The average left ventricular global longitudinal strain is -15.4 %. The global longitudinal strain is abnormal. The left ventricular internal cavity size was normal in size. There is borderline left ventricular hypertrophy. Left ventricular diastolic parameters are consistent with Grade I diastolic dysfunction (impaired relaxation). Elevated left atrial pressure. Right Ventricle: The right ventricular size is normal. No increase in right ventricular wall thickness. Right ventricular systolic function is normal. There is severely elevated pulmonary artery systolic pressure. The tricuspid regurgitant velocity is 3.91 m/s, and with an assumed right atrial pressure of 8 mmHg, the estimated right ventricular systolic pressure is 09.8 mmHg. Left Atrium: Left atrial size was normal in size. Right Atrium: Right atrial size was normal in size. Pericardium: The pericardium was not well visualized. Presence of pericardial fat pad. Mitral Valve: The mitral valve is normal  in structure. There is mild thickening of the mitral valve leaflet(s). Mild mitral annular calcification. Trivial mitral  valve regurgitation. No evidence of mitral valve stenosis. Tricuspid Valve: The tricuspid valve is normal in structure. Tricuspid valve regurgitation is moderate. Aortic Valve: The aortic valve is tricuspid. There is mild thickening of the aortic valve. Aortic valve regurgitation is not visualized. Mild aortic valve sclerosis is present, with no evidence of aortic valve stenosis. Aortic valve mean gradient measures 8.3 mmHg. Aortic valve peak gradient measures 15.6 mmHg. Aortic valve area, by VTI measures 1.95 cm. Pulmonic Valve: The pulmonic valve was normal in structure. Pulmonic valve regurgitation is trivial. No evidence of pulmonic stenosis. Aorta: The aortic root is normal in size and structure and aortic dilatation noted. There is mild dilatation of the ascending aorta, measuring 39 mm. Pulmonary Artery: The pulmonary artery is not well seen. Venous: The inferior vena cava is normal in size with less than 50% respiratory variability, suggesting right atrial pressure of 8 mmHg. IAS/Shunts: The interatrial septum was not well visualized.  LEFT VENTRICLE PLAX 2D LVIDd:         3.26 cm  Diastology LVIDs:         2.15 cm  LV e' medial:    5.55 cm/s LV PW:         1.10 cm  LV E/e' medial:  16.1 LV IVS:        0.80 cm  LV e' lateral:   6.20 cm/s LVOT diam:     2.00 cm  LV E/e' lateral: 14.4 LV SV:         68 LV SV Index:   36       2D Longitudinal Strain LVOT Area:     3.14 cm 2D Strain GLS Avg:     -15.4 %                          3D Volume EF:                         3D EF:        59 %                         LV EDV:       103 ml                         LV ESV:       43 ml                         LV SV:        60 ml RIGHT VENTRICLE RV Basal diam:  3.23 cm RV S prime:     13.30 cm/s TAPSE (M-mode): 3.0 cm LEFT ATRIUM             Index       RIGHT ATRIUM           Index LA diam:        3.50 cm  1.86 cm/m  RA Area:     14.00 cm LA Vol (A2C):   24.3 ml 12.88 ml/m RA Volume:   34.80 ml  18.45 ml/m LA Vol (A4C):   24.8 ml 13.15 ml/m LA Biplane Vol: 25.9 ml 13.73 ml/m  AORTIC VALVE                    PULMONIC VALVE AV Area (Vmax):  1.75 cm     PV Vmax:        0.88 m/s AV Area (Vmean):   1.92 cm     PV Peak grad:   3.1 mmHg AV Area (VTI):     1.95 cm     RVOT Peak grad: 4 mmHg AV Vmax:           197.67 cm/s AV Vmean:          134.000 cm/s AV VTI:            0.348 m AV Peak Grad:      15.6 mmHg AV Mean Grad:      8.3 mmHg LVOT Vmax:         110.00 cm/s LVOT Vmean:        81.900 cm/s LVOT VTI:          0.216 m LVOT/AV VTI ratio: 0.62  AORTA Ao Root diam: 3.53 cm MITRAL VALVE                TRICUSPID VALVE MV Area (PHT): 3.45 cm     TR Peak grad:   61.2 mmHg MV Decel Time: 220 msec     TR Vmax:        391.00 cm/s MV E velocity: 89.10 cm/s MV A velocity: 120.00 cm/s  SHUNTS MV E/A ratio:  0.74         Systemic VTI:  0.22 m                             Systemic Diam: 2.00 cm Nelva Bush MD Electronically signed by Nelva Bush MD Signature Date/Time: 02/02/2021/12:58:19 PM    Final          ASSESSMENT/PLAN   Acute on chronic hypoxemic respiratory failure - present on admission - in context of sepsis with MRSE bacteremia x 2 - Infectious disease on case - appreciate input -patient s/p surgical pathology of left foot - acute osteomyelitis + - vascular surgery on case - appreciate input - cxr with worsening interval changes as evidenced above - suggesting ILD sarcoidosis exacerbation - while in sepsis bacteremia may dc methotrexate and use solumedrol only, will need CT high resolution to review parenchyma for better differentiation of infection vs ILD  - COVID19 -negative  - supplemental O2 during my evaluation 2L/min - s/p TTE - report pending  -Respiratory viral panel-negative -serum fungitell -legionella ab -strep pneumoniae ur AG -Histoplasma Ur Ag -sputum resp cultures -AFB  sputum expectorated specimen -sputum cytology  -reviewed pertinent imaging with patient today - ESR/RF/Ddimer/CRP to evaluate inflammatory biomarkers -ACE level due to underlying sarcoidosis with possible exacerbation  -Agree with solumedrol 40 bid -DC MTX 25 once weekly while in sepsis with bacteremia  -patient on IVF 75cc/hr, she is euvolemic on examination and I will dc IV fluids due to potential for edema and worsening hypoxemia. -PT/OT for d/c planning  -please encourage patient to use incentive spirometer few times each hour while hospitalized.     ILD associated Pulmonary Hypertension  -Patient is currently on [place] and is well familiar with device - she has follow up with pulmonary on outpatient set up   -Patient may benefit from pulmonary hypertension work-up outpatient- most recent NT proBNP was 340 -Transthoracic echo has been performed and report is pending -Continue with  PRN diuresis and supplemental oxygen at this time  -Cardiology on case-appreciate input   Pulmonary sarcoidosis -Stage IV CXR with bibasilar  fibrosis -Currently on MTX 25 once weekly-we will hold while in acute sepsis with bacteremia -Continue Solu-Medrol 40 twice daily with transition to p.o. prednisone with slow taper -will obtain CT chest noncontrasted to evaluate lung parenchyma and fibrotic burden -will need outpatient follow-up with full PFTs -Evaluation for BiPAP/noninvasive ventilator rather than CPAP due to restrictive pulmonary physiology with fibrotic changes on CXR this admission.    Obstructive sleep apnea -Continue home CPAP with 3 to 4 L oxygen bleed in with humidification -May change settings as per RT while acutely ill   Thank you for allowing me to participate in the care of this patient.   Patient/Family are satisfied with care plan and all questions have been answered.  This document was prepared using Dragon voice recognition software and may include unintentional dictation  errors.     Ottie Glazier, M.D.  Division of [place]

## 2021-02-03 NOTE — Progress Notes (Signed)
[facility] PROGRESS NOTE Date of Admission:  01/31/2021     ID: Stacie [name] is a 65 y.o. female with  Principal Problem:   Acute on chronic respiratory failure with hypoxia (HCC) Active Problems:   Pulmonary sarcoidosis (HCC)   CKD (chronic kidney disease) stage 3, GFR 30-59 ml/min (HCC)   Obstructive sleep apnea   Hypothyroidism, acquired   Pulmonary hypertension ([place])   Sepsis ([place])   History of CVA (cerebrovascular accident)   Subjective: No fevers. FU BCX neg. BCX has been updated so same 2 species in both bottles Reports cough and back pain better.   ROS  Eleven systems are reviewed and negative except per hpi  Medications:  Antibiotics Given (last 72 hours)    Date/Time Action Medication Dose Rate   01/31/21 1810 New Bag/Given   cefTRIAXone (ROCEPHIN) 1 g in sodium chloride 0.9 % 100 mL IVPB 1 g 200 mL/hr   01/31/21 1851 New Bag/Given   azithromycin (ZITHROMAX) 500 mg in sodium chloride 0.9 % 250 mL IVPB 500 mg 250 mL/hr   02/01/21 0925 New Bag/Given   azithromycin (ZITHROMAX) 500 mg in sodium chloride 0.9 % 250 mL IVPB 500 mg 250 mL/hr   02/01/21 1024 New Bag/Given   cefTRIAXone (ROCEPHIN) 1 g in sodium chloride 0.9 % 100 mL IVPB 1 g 200 mL/hr   02/01/21 1609 New Bag/Given   vancomycin (VANCOREADY) IVPB 1750 mg/350 mL 1,750 mg 175 mL/hr   02/02/21 1400 New Bag/Given   vancomycin (VANCOREADY) IVPB 1250 mg/250 mL 1,250 mg 166.7 mL/hr   02/02/21 1644 New Bag/Given   cefTRIAXone (ROCEPHIN) 2 g in sodium chloride 0.9 % 100 mL IVPB 2 g 200 mL/hr   02/02/21 1800 New Bag/Given   azithromycin (ZITHROMAX) 500 mg in sodium chloride 0.9 % 250 mL IVPB 500 mg 250 mL/hr   02/03/21 1348 New Bag/Given   vancomycin (VANCOREADY) IVPB 1250 mg/250 mL 1,250 mg 166.7 mL/hr     . aspirin EC  81 mg Oral Daily  . atorvastatin  40 mg Oral Daily  . dextromethorphan  15 mg Oral BID  . diclofenac Sodium  2 g Topical QID  . enoxaparin (LOVENOX) injection  40 mg  Subcutaneous Q24H  . escitalopram  20 mg Oral Daily  . folic acid  1 mg Oral Daily  . ipratropium-albuterol  3 mL Nebulization TID  . levothyroxine  137 mcg Oral QAC breakfast  . methylPREDNISolone (SOLU-MEDROL) injection  40 mg Intravenous Q12H  . mometasone-formoterol  2 puff Inhalation BID  . polyethylene glycol  17 g Oral Daily  . potassium citrate  20 mEq Oral TID WC  . senna-docusate  1 tablet Oral QHS  . Treprostinil  0.6 mcg Inhalation See admin instructions    Objective: Vital signs in last 24 hours: Temp:  [97.7 F (36.5 C)-98.5 F (36.9 C)] 98.5 F (36.9 C) (04/20 1120) Pulse Rate:  [88-101] 94 (04/20 1120) Resp:  [18-22] 18 (04/20 1120) BP: (126-163)/(65-92) 149/92 (04/20 1120) SpO2:  [93 %-99 %] 94 % (04/20 1120) Weight:  [81.8 kg] 81.8 kg (04/20 0557) Constitutional:  oriented to person, place, and time. appears well-developed and well-nourished. No distress. On O2. HENT: [place]/AT, PERRLA, no scleral icterus Mouth/Throat: Oropharynx is clear and moist. No oropharyngeal exudate.  Cardiovascular: Normal rate, regular rhythm and normal heart sounds. Exam reveals no gallop and no friction rub.  No murmur heard.  Pulmonary/Chest: coarse BS bil Neck = supple, no nuchal rigidity Abdominal: Soft. Bowel sounds are  normal.  exhibits no distension. There is no tenderness.  Lymphadenopathy: no cervical adenopathy. No axillary adenopathy Neurological: alert and oriented to person, place, and time.  Skin:wound with some surrounding erythema and induration     Psychiatric: a normal mood and affect.  behavior is normal.   Lab Results Recent Labs    02/02/21 0443 02/03/21 0323  WBC 20.3* 21.6*  HGB 10.9* 10.6*  HCT 34.4* 33.3*  NA 142 143  K 4.3 4.3  CL 102 104  CO2 30 27  BUN 32* 34*  CREATININE 1.25* 1.11*    Microbiology: Results for orders placed or performed during the hospital encounter of 01/31/21  Resp Panel by RT-PCR (Flu A&B, Covid) Nasopharyngeal Swab      Status: None   Collection Time: 01/31/21  4:06 PM   Specimen: Nasopharyngeal Swab; Nasopharyngeal(NP) swabs in vial transport medium  Result Value Ref Range Status   SARS Coronavirus 2 by RT PCR NEGATIVE NEGATIVE Final    Comment: (NOTE) SARS-CoV-2 target nucleic acids are NOT DETECTED.  The SARS-CoV-2 RNA is generally detectable in upper respiratory specimens during the acute phase of infection. The lowest concentration of SARS-CoV-2 viral copies this assay can detect is 138 copies/mL. A negative result does not preclude SARS-Cov-2 infection and should not be used as the sole basis for treatment or other patient management decisions. A negative result may occur with  improper specimen collection/handling, submission of specimen other than nasopharyngeal swab, presence of viral mutation(s) within the areas targeted by this assay, and inadequate number of viral copies(<138 copies/mL). A negative result must be combined with clinical observations, patient history, and epidemiological information. The expected result is Negative.  Fact Sheet for Patients:  EntrepreneurPulse.com.au  Fact Sheet for Healthcare Providers:  IncredibleEmployment.be  This test is no t yet approved or cleared by the [place] FDA and  has been authorized for detection and/or diagnosis of SARS-CoV-2 by FDA under an Emergency Use Authorization (EUA). This EUA will remain  in effect (meaning this test can be used) for the duration of the COVID-19 declaration under Section 564(b)(1) of the Act, 21 U.S.C.section 360bbb-3(b)(1), unless the authorization is terminated  or revoked sooner.       Influenza A by PCR NEGATIVE NEGATIVE Final   Influenza B by PCR NEGATIVE NEGATIVE Final    Comment: (NOTE) The Xpert Xpress SARS-CoV-2/FLU/RSV plus assay is intended as an aid in the diagnosis of influenza from Nasopharyngeal swab specimens and should not be used as a sole basis  for treatment. Nasal washings and aspirates are unacceptable for Xpert Xpress SARS-CoV-2/FLU/RSV testing.  Fact Sheet for Patients: EntrepreneurPulse.com.au  Fact Sheet for Healthcare Providers: IncredibleEmployment.be  This test is not yet approved or cleared by the [place] FDA and has been authorized for detection and/or diagnosis of SARS-CoV-2 by FDA under an Emergency Use Authorization (EUA). This EUA will remain in effect (meaning this test can be used) for the duration of the COVID-19 declaration under Section 564(b)(1) of the Act, 21 U.S.C. section 360bbb-3(b)(1), unless the authorization is terminated or revoked.  Performed at [facility], [address] [place], [place] [address]   Blood culture (routine x 2)     Status: Abnormal (Preliminary result)   Collection Time: 01/31/21  5:53 PM   Specimen: BLOOD  Result Value Ref Range Status   Specimen Description   Final    BLOOD RIGHT ANTECUBITAL Performed at [facility], [address], [place], [place] [address]    Special  Requests   Final    BOTTLES DRAWN AEROBIC AND ANAEROBIC Blood Culture adequate volume Performed at [facility], [address] [place], [place] [address]    Culture  Setup Time   Final    GRAM POSITIVE COCCI IN BOTH AEROBIC AND ANAEROBIC BOTTLES CRITICAL VALUE NOTED.  VALUE IS CONSISTENT WITH PREVIOUSLY REPORTED AND CALLED VALUE. Performed at [facility], [address] [place], [place] [address]    Culture (A)  Final    STAPHYLOCOCCUS HOMINIS STAPHYLOCOCCUS EPIDERMIDIS SUSCEPTIBILITIES PERFORMED ON PREVIOUS CULTURE WITHIN THE LAST 5 DAYS. Performed at [facility], [address], [place], [place] [address]    Report Status PENDING  Incomplete  Blood culture (routine x 2)     Status: Abnormal (Preliminary result)   Collection Time: 01/31/21  5:53 PM   Specimen: BLOOD  Result Value Ref Range Status    Specimen Description   Final    BLOOD BLOOD LEFT HAND Performed at [facility], [address], [place], [place] [address]    Special Requests   Final    BOTTLES DRAWN AEROBIC AND ANAEROBIC Blood Culture adequate volume Performed at [facility], [address] [place], [place] [address]    Culture  Setup Time   Final    GRAM POSITIVE COCCI IN BOTH AEROBIC AND ANAEROBIC BOTTLES CRITICAL RESULT CALLED TO, READ BACK BY AND VERIFIED WITH: [place] AT 1243 02/01/21 SDR    Culture (A)  Final    STAPHYLOCOCCUS EPIDERMIDIS STAPHYLOCOCCUS HOMINIS SUSCEPTIBILITIES TO FOLLOW Performed at [facility], [place] [address] [place], [place] [address]    Report Status PENDING  Incomplete   Organism ID, Bacteria STAPHYLOCOCCUS EPIDERMIDIS  Final      Susceptibility   Staphylococcus epidermidis - MIC*    CIPROFLOXACIN >=8 RESISTANT Resistant     ERYTHROMYCIN >=8 RESISTANT Resistant     GENTAMICIN >=16 RESISTANT Resistant     OXACILLIN >=4 RESISTANT Resistant     TETRACYCLINE >=16 RESISTANT Resistant     VANCOMYCIN 1 SENSITIVE Sensitive     TRIMETH/SULFA 80 RESISTANT Resistant     CLINDAMYCIN >=8 RESISTANT Resistant     RIFAMPIN <=0.5 SENSITIVE Sensitive     Inducible Clindamycin NEGATIVE Sensitive     * STAPHYLOCOCCUS EPIDERMIDIS  Blood Culture ID Panel (Reflexed)     Status: Abnormal   Collection Time: 01/31/21  5:53 PM  Result Value Ref Range Status   Enterococcus faecalis NOT DETECTED NOT DETECTED Final   Enterococcus Faecium NOT DETECTED NOT DETECTED Final   Listeria monocytogenes NOT DETECTED NOT DETECTED Final   Staphylococcus species DETECTED (A) NOT DETECTED Final    Comment: CRITICAL RESULT CALLED TO, READ BACK BY AND VERIFIED WITH:  SUSAN WATSON AT 1243 02/01/21 SDR    Staphylococcus aureus (BCID) NOT DETECTED NOT DETECTED Final   Staphylococcus epidermidis DETECTED (A) NOT DETECTED Final    Comment: Methicillin (oxacillin) resistant coagulase  negative staphylococcus. Possible blood culture contaminant (unless isolated from more than one blood culture draw or clinical case suggests pathogenicity). No antibiotic treatment is indicated for blood  culture contaminants. CRITICAL RESULT CALLED TO, READ BACK BY AND VERIFIED WITH:  SUSAN WATSON AT 1243 02/01/21 SDR    Staphylococcus lugdunensis NOT DETECTED NOT DETECTED Final   Streptococcus species NOT DETECTED NOT DETECTED Final   Streptococcus agalactiae NOT DETECTED NOT DETECTED Final   Streptococcus pneumoniae NOT DETECTED NOT DETECTED Final   Streptococcus pyogenes NOT DETECTED NOT DETECTED Final  A.calcoaceticus-baumannii NOT DETECTED NOT DETECTED Final   Bacteroides fragilis NOT DETECTED NOT DETECTED Final   Enterobacterales NOT DETECTED NOT DETECTED Final   Enterobacter cloacae complex NOT DETECTED NOT DETECTED Final   Escherichia coli NOT DETECTED NOT DETECTED Final   Klebsiella aerogenes NOT DETECTED NOT DETECTED Final   Klebsiella oxytoca NOT DETECTED NOT DETECTED Final   Klebsiella pneumoniae NOT DETECTED NOT DETECTED Final   Proteus species NOT DETECTED NOT DETECTED Final   Salmonella species NOT DETECTED NOT DETECTED Final   Serratia marcescens NOT DETECTED NOT DETECTED Final   Haemophilus influenzae NOT DETECTED NOT DETECTED Final   Neisseria meningitidis NOT DETECTED NOT DETECTED Final   Pseudomonas aeruginosa NOT DETECTED NOT DETECTED Final   Stenotrophomonas maltophilia NOT DETECTED NOT DETECTED Final   Candida albicans NOT DETECTED NOT DETECTED Final   Candida auris NOT DETECTED NOT DETECTED Final   Candida glabrata NOT DETECTED NOT DETECTED Final   Candida krusei NOT DETECTED NOT DETECTED Final   Candida parapsilosis NOT DETECTED NOT DETECTED Final   Candida tropicalis NOT DETECTED NOT DETECTED Final   Cryptococcus neoformans/gattii NOT DETECTED NOT DETECTED Final   Methicillin resistance mecA/C DETECTED (A) NOT DETECTED Final    Comment: CRITICAL RESULT  CALLED TO, READ BACK BY AND VERIFIED WITH:  SUSAN WATSON AT 7989 02/01/21 SDR Performed at [facility], [address] [place], [place] [address]   Respiratory (~20 pathogens) panel by PCR     Status: None   Collection Time: 02/01/21 12:44 PM   Specimen: Nasopharyngeal Swab; Respiratory  Result Value Ref Range Status   Adenovirus NOT DETECTED NOT DETECTED Final   Coronavirus 229E NOT DETECTED NOT DETECTED Final    Comment: (NOTE) The Coronavirus on the Respiratory Panel, DOES NOT test for the novel  Coronavirus (2019 nCoV)    Coronavirus HKU1 NOT DETECTED NOT DETECTED Final   Coronavirus NL63 NOT DETECTED NOT DETECTED Final   Coronavirus OC43 NOT DETECTED NOT DETECTED Final   Metapneumovirus NOT DETECTED NOT DETECTED Final   Rhinovirus / Enterovirus NOT DETECTED NOT DETECTED Final   Influenza A NOT DETECTED NOT DETECTED Final   Influenza B NOT DETECTED NOT DETECTED Final   Parainfluenza Virus 1 NOT DETECTED NOT DETECTED Final   Parainfluenza Virus 2 NOT DETECTED NOT DETECTED Final   Parainfluenza Virus 3 NOT DETECTED NOT DETECTED Final   Parainfluenza Virus 4 NOT DETECTED NOT DETECTED Final   Respiratory Syncytial Virus NOT DETECTED NOT DETECTED Final   Bordetella pertussis NOT DETECTED NOT DETECTED Final   Bordetella Parapertussis NOT DETECTED NOT DETECTED Final   Chlamydophila pneumoniae NOT DETECTED NOT DETECTED Final   Mycoplasma pneumoniae NOT DETECTED NOT DETECTED Final    Comment: Performed at [facility], [address] [place], [place] [address]  Culture, blood (single) w Reflex to ID Panel     Status: None (Preliminary result)   Collection Time: 02/02/21  4:43 AM   Specimen: BLOOD  Result Value Ref Range Status   Specimen Description BLOOD RIGHT [facility]  Final   Special Requests   Final    BOTTLES DRAWN AEROBIC AND ANAEROBIC Blood Culture adequate volume   Culture   Final    NO GROWTH < 24 HOURS Performed at [facility], [address],  [place],  [address]    Report Status PENDING  Incomplete  MRSA PCR Screening     Status: None   Collection Time: 02/03/21  9:56 AM   Specimen: Nasal Mucosa; Nasopharyngeal  Result Value Ref Range Status   MRSA by PCR NEGATIVE NEGATIVE Final    Comment:        The GeneXpert MRSA Assay (FDA approved for NASAL specimens only), is one component of a comprehensive MRSA colonization surveillance program. It is not intended to diagnose MRSA infection nor to guide or monitor treatment for MRSA infections. Performed at [facility], [address] [place], [place] [address]     Studies/Results: [place] Chest 2 View  Result Date: 02/02/2021 CLINICAL DATA:  Pulmonary sarcoid.  Pneumonia. EXAM: CHEST - 2 VIEW COMPARISON:  01/31/2021 FINDINGS: Chronic interstitial coarsening with hilar architectural distortion and apical volume loss. Fibrotic changes are worse at the left apex. As noted previously there may be superimposed pneumonia. Normal heart size. Stable mediastinal contours. Vertebroplasty of midthoracic levels. IMPRESSION: Sarcoid associated pulmonary fibrosis and history of pneumonia. No progression since 2 days ago. Electronically Signed   By: Monte Fantasia M.D.   On: 02/02/2021 08:14   CT Chest High Resolution  Result Date: 02/03/2021 CLINICAL DATA:  Cough with increased shortness of breath for 3 weeks. Sarcoid. EXAM: CT CHEST WITHOUT CONTRAST TECHNIQUE: Multidetector CT imaging of the chest was performed following the standard protocol without intravenous contrast. High resolution imaging of the lungs, as well as inspiratory and expiratory imaging, was performed. COMPARISON:  07/10/2020 and 02/20/2015. FINDINGS: Cardiovascular: Atherosclerotic calcification of the aorta, aortic valve and coronary arteries. Pulmonic trunk is enlarged. Heart is at the upper limits of normal in size. No pericardial effusion. Mediastinum/Nodes: No pathologically enlarged mediastinal lymph nodes. Hilar  regions are difficult to evaluate without IV contrast but appear enlarged, as before. No axillary adenopathy. Esophagus is grossly unremarkable. Lungs/Pleura: Extensive upper and midlung zone predominant traction bronchiectasis, architectural distortion, interstitial coarsening, peribronchovascular nodularity and ground-glass, slightly progressive from 07/10/2020 in the lung bases. Associated subpleural cysts in the lung bases. No pleural fluid. Airway is otherwise unremarkable. Upper Abdomen: Visualized portions of the liver, gallbladder, adrenal glands, kidneys, spleen, pancreas, stomach and bowel are grossly unremarkable with the exceptions of a small hiatal hernia and large duodenal diverticulum, incompletely imaged. Musculoskeletal: Degenerative changes in the spine. Vertebral body augmentations. T6 and T12 compression fractures are unchanged. There may be a new compression deformity of the T2 superior endplate. IMPRESSION: 1. Pulmonary parenchymal findings of end-stage sarcoid. Increased peribronchovascular nodularity and ground-glass in the lung bases may be due to disease progression or superimposed infectious bronchiolitis/bronchopneumonia. 2. New T2 superior endplate compression fracture. 3. Aortic atherosclerosis (ICD10-I70.0). Coronary artery calcification. 4. Enlarged pulmonic trunk, indicative of pulmonary arterial hypertension. Electronically Signed   By: Lorin Picket M.D.   On: 02/03/2021 11:07   CT FOOT LEFT W CONTRAST  Result Date: 02/01/2021 CLINICAL DATA:  Left foot infection. Recent amputation three weeks ago. EXAM: CT OF THE LOWER LEFT EXTREMITY WITH CONTRAST TECHNIQUE: Multidetector CT imaging of the lower left extremity was performed according to the standard protocol following intravenous contrast administration. CONTRAST:  37m OMNIPAQUE IOHEXOL 300 MG/ML  SOLN COMPARISON:  None. FINDINGS: Bones/Joint/Cartilage Prior great toe amputation to the level of the first metatarsal head. The  surgical margin is not sharp and there is continued cortical irregularity. Prior second toe amputation to the level of the second metatarsal neck. The surgical margin appears sharp, however there is a small acute minimally displaced oblique fracture of the second metatarsal neck. Prior third toe amputation. No dislocation. Joint spaces are preserved. No joint effusion. Ligaments Ligaments are suboptimally evaluated by CT. Muscles and Tendons Grossly  intact. Soft tissue Soft tissue thickening at the first and second toe amputation sites. No discrete fluid collection. No subcutaneous emphysema. No soft tissue mass. IMPRESSION: 1. Prior great toe amputation to the level of the first metatarsal head. The surgical margin is not sharp and there is continued cortical irregularity, concerning for osteomyelitis. No discrete abscess. 2. Prior second toe amputation to the level of the second metatarsal neck. The surgical margin appears sharp, however there is a small acute minimally displaced oblique fracture of the second metatarsal neck. Electronically Signed   By: Titus Dubin M.D.   On: 02/01/2021 16:11   ECHOCARDIOGRAM COMPLETE  Result Date: 02/02/2021    ECHOCARDIOGRAM REPORT   Patient Name:   Stacie [name] Date of Exam: 02/02/2021 Medical Rec #:  [id number]         Height:       67.0 in Accession #:    [id number]        Weight:       169.8 lb Date of Birth:  [birth year]          BSA:          1.886 m Patient Age:    46 years          BP:           122/79 mmHg Patient Gender: F                 HR:           97 bpm. Exam Location:  [facility] Procedure: 2D Echo, Cardiac Doppler, Color Doppler and Strain Analysis Indications:     Bacteremia R78.81  History:         Patient has prior history of Echocardiogram examinations, most                  recent 07/27/2020. Pulmonary HTN and Stroke;                  Signs/Symptoms:Murmur.  Sonographer:     Sherrie Sport RDCS (AE) Referring Phys:  [place] Diagnosing Phys:  Nelva Bush MD  Sonographer Comments: Global longitudinal strain was attempted. IMPRESSIONS  1. Left ventricular ejection fraction, by estimation, is 60 to 65%. The left ventricle has normal function. Left ventricular endocardial border not optimally defined to evaluate regional wall motion. Left ventricular diastolic parameters are consistent with Grade I diastolic dysfunction (impaired relaxation). Elevated left atrial pressure. The average left ventricular global longitudinal strain is -15.4 %. The global longitudinal strain is abnormal.  2. Right ventricular systolic function is normal. The right ventricular size is normal. There is severely elevated pulmonary artery systolic pressure.  3. The mitral valve is normal in structure. Trivial mitral valve regurgitation. No evidence of mitral stenosis.  4. Tricuspid valve regurgitation is moderate.  5. The aortic valve is tricuspid. There is mild thickening of the aortic valve. Aortic valve regurgitation is not visualized. Mild aortic valve sclerosis is present, with no evidence of aortic valve stenosis.  6. Aortic dilatation noted. There is mild dilatation of the ascending aorta, measuring 39 mm.  7. The inferior vena cava is normal in size with <50% respiratory variability, suggesting right atrial pressure of 8 mmHg. Conclusion(s)/Recommendation(s): No obvious vegetation identified, though native valvular disease is present. Consider transesophageal echocardiogram if clinical concern for endocarditis persists. FINDINGS  Left Ventricle: Left ventricular ejection fraction, by estimation, is 60 to 65%. The left ventricle has normal function. Left ventricular endocardial border not optimally  defined to evaluate regional wall motion. The average left ventricular global longitudinal strain is -15.4 %. The global longitudinal strain is abnormal. The left ventricular internal cavity size was normal in size. There is borderline left ventricular hypertrophy. Left  ventricular diastolic parameters are consistent with Grade I diastolic dysfunction (impaired relaxation). Elevated left atrial pressure. Right Ventricle: The right ventricular size is normal. No increase in right ventricular wall thickness. Right ventricular systolic function is normal. There is severely elevated pulmonary artery systolic pressure. The tricuspid regurgitant velocity is 3.91 m/s, and with an assumed right atrial pressure of 8 mmHg, the estimated right ventricular systolic pressure is 01.7 mmHg. Left Atrium: Left atrial size was normal in size. Right Atrium: Right atrial size was normal in size. Pericardium: The pericardium was not well visualized. Presence of pericardial fat pad. Mitral Valve: The mitral valve is normal in structure. There is mild thickening of the mitral valve leaflet(s). Mild mitral annular calcification. Trivial mitral valve regurgitation. No evidence of mitral valve stenosis. Tricuspid Valve: The tricuspid valve is normal in structure. Tricuspid valve regurgitation is moderate. Aortic Valve: The aortic valve is tricuspid. There is mild thickening of the aortic valve. Aortic valve regurgitation is not visualized. Mild aortic valve sclerosis is present, with no evidence of aortic valve stenosis. Aortic valve mean gradient measures 8.3 mmHg. Aortic valve peak gradient measures 15.6 mmHg. Aortic valve area, by VTI measures 1.95 cm. Pulmonic Valve: The pulmonic valve was normal in structure. Pulmonic valve regurgitation is trivial. No evidence of pulmonic stenosis. Aorta: The aortic root is normal in size and structure and aortic dilatation noted. There is mild dilatation of the ascending aorta, measuring 39 mm. Pulmonary Artery: The pulmonary artery is not well seen. Venous: The inferior vena cava is normal in size with less than 50% respiratory variability, suggesting right atrial pressure of 8 mmHg. IAS/Shunts: The interatrial septum was not well visualized.  LEFT VENTRICLE PLAX  2D LVIDd:         3.26 cm  Diastology LVIDs:         2.15 cm  LV e' medial:    5.55 cm/s LV PW:         1.10 cm  LV E/e' medial:  16.1 LV IVS:        0.80 cm  LV e' lateral:   6.20 cm/s LVOT diam:     2.00 cm  LV E/e' lateral: 14.4 LV SV:         68 LV SV Index:   36       2D Longitudinal Strain LVOT Area:     3.14 cm 2D Strain GLS Avg:     -15.4 %                          3D Volume EF:                         3D EF:        59 %                         LV EDV:       103 ml                         LV ESV:       43 ml  LV SV:        60 ml RIGHT VENTRICLE RV Basal diam:  3.23 cm RV S prime:     13.30 cm/s TAPSE (M-mode): 3.0 cm LEFT ATRIUM             Index       RIGHT ATRIUM           Index LA diam:        3.50 cm 1.86 cm/m  RA Area:     14.00 cm LA Vol (A2C):   24.3 ml 12.88 ml/m RA Volume:   34.80 ml  18.45 ml/m LA Vol (A4C):   24.8 ml 13.15 ml/m LA Biplane Vol: 25.9 ml 13.73 ml/m  AORTIC VALVE                    PULMONIC VALVE AV Area (Vmax):    1.75 cm     PV Vmax:        0.88 m/s AV Area (Vmean):   1.92 cm     PV Peak grad:   3.1 mmHg AV Area (VTI):     1.95 cm     RVOT Peak grad: 4 mmHg AV Vmax:           197.67 cm/s AV Vmean:          134.000 cm/s AV VTI:            0.348 m AV Peak Grad:      15.6 mmHg AV Mean Grad:      8.3 mmHg LVOT Vmax:         110.00 cm/s LVOT Vmean:        81.900 cm/s LVOT VTI:          0.216 m LVOT/AV VTI ratio: 0.62  AORTA Ao Root diam: 3.53 cm MITRAL VALVE                TRICUSPID VALVE MV Area (PHT): 3.45 cm     TR Peak grad:   61.2 mmHg MV Decel Time: 220 msec     TR Vmax:        391.00 cm/s MV E velocity: 89.10 cm/s MV A velocity: 120.00 cm/s  SHUNTS MV E/A ratio:  0.74         Systemic VTI:  0.22 m                             Systemic Diam: 2.00 cm Nelva Bush MD Electronically signed by Nelva Bush MD Signature Date/Time: 02/02/2021/12:58:19 PM    Final     Assessment/Plan: A Remington is a 66 y.o. 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 She had recent toe amputation and eval by Dr Lucky Cowboy who feels the site does not appear infected. CT scan shows possible cortical irregularity.  4/19 . Remains with cough. On 2L O2 which is her baseline TTE neg 4/20 BCX updated with same 2 species in both bottles- less likely contaminant. MRI shows osteomyelitis.  ESR > 140, CRP 14  Recommendations Cont vancomycin Will dw Dr Lucky Cowboy tomorrow  options - could consider bxp of the bone to send for path and culture to help guide treatment however she has been on abx now so likely cultures will be negative. Can consider empiric vanco and ceftriaxone.  Would place picc line. Thank you very much for the consult. Will follow with you.  Leonel Ramsay   02/03/2021, 2:46 PM

## 2021-02-03 NOTE — Progress Notes (Signed)
PROGRESS NOTE   Stacie [name]  IEP:[id number] DOB: Dec 07, 1955 DOA: 01/31/2021 PCP: Glean Hess, MD  Brief Narrative:   13 white female community dwelling Pulmonary hypertension-right heart cath suggestive of the same 10/18/2018-on sildenafil but failed the same-improved on Tyvaso BMI 28-pulmonary sarcoid diagnosed 2009 followed by [facility] pulmonary [place] Whitson, MD with chronic respiratory failure/chronic steroid use-- Autoamputation of 2 lower extremity toes with exposed bone status post surgery 01/14/2021 vascular  Also history renal calculi hypothyroid prior C. difficile OSA  At last office visit at [facility] 3/16 "Wearing oxygen currently at all times Endorses exertional dyspnea orthopnea"  4/11 developed URI symptoms with sick contacts initially around her-symptoms worsened 4/17 increasing SOB increasing cough increasing sputum, subjective fever sweats + posttussive emesis Also developed syncope and was found to be orthostatic on coming to the ED by EMS-Rx Solu-Medrol and DuoNeb SPO2 82% in ED  WBC 21 BUN/creatinine 25/1.2 blood cultures obtained CXR = asymmetric infiltrates in the left apex Rx ceftriaxone azithromycin   Subjective:  She reports cough, causing her to have bilateral lower back pain, denies any fever or chills.  Hospital-Problem based course  Sepsis secondary  pneumonia based on infiltrates on CXR in an immunocompromised host on methotrexate, steroids -Sepsis present on admission. -Appreciated, antibiotics management per ID, currently on Rocephin and azithromycin. -MRSA PCR is negative , will DC IV vancomycin.  2/2 bottles MSSE -Blood culture with different species, likely contaminant  Acute on chronic hypoxic respiratory failure -He is on 2 L nasal cannula at home at baseline, requiring up to 4 L initially, currently oxygen requirement back to baseline -Possibly related to pulmonary sarcoidosis versus pneumonia, with known underlying OSA, pulmonary  hypertension. -Agement per pulmonary, remains on IV steroids, high-sensitivity CT chest obtained, readings are pending. -Methotrexate remains on hold given acute infection.  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 Kathyrn Sheriff at bedside Disposition:  Status is:   The patient will require care spanning > 2 midnights and should be moved to inpatient because: Persistent severe electrolyte disturbances  Patient will need home health with PT and rolling walker which will need to be ordered  Dispo: The patient is from: Home              Anticipated d/c is to: Home              Patient currently is not medically stable to d/c.   Difficult to place patient No  Consultants:   Pulmonology  Vascular surgery  ID  Procedures: CT lower extremity negative for osteomyelitis  Antimicrobials:  Vancomycin changed to Rocephin and azithromycin  Objective: Vitals:   02/03/21 0557 02/03/21 0805 02/03/21 0816 02/03/21 1120  BP:   (!) 160/84 (!) 149/92  Pulse:   93 94  Resp:   20 18  Temp:   98.1 F (36.7 C) 98.5 F (36.9 C)  TempSrc:   Oral Oral  SpO2:  97% 97% 94%  Weight: 81.8 kg     Height:        Intake/Output Summary (Last 24 hours) at 02/03/2021 1349 Last data filed at 02/03/2021 0700 Gross per 24 hour  Intake 1080 ml  Output 2000 ml  Net -920 ml   Filed Weights   02/01/21 0419 02/02/21 0551 02/03/21 0557  Weight: 81.1 kg 77 kg 81.8 kg  Examination:  Awake Alert, Oriented X 3, No new F.N deficits, Normal affect Symmetrical Chest wall movement, Good air movement bilaterally, CTAB RRR,No Gallops,Rubs or new Murmurs, No Parasternal Heave +ve B.Sounds, Abd Soft, No tenderness, No rebound - guarding or rigidity. No Cyanosis, Clubbing ,trace edema, left foot bandaged  Data  Reviewed: personally reviewed   WBC 21-->14-->20 Hemoglobin 11-->10.9 Platelet 293 BUN/creatinine 25/1.2-->26/1.2-nephrograms and 32/1.2 CRP is 14 CBG range 1 60-1 90   Radiology Studies: DG Chest 2 View  Result Date: 02/02/2021 CLINICAL DATA:  Pulmonary sarcoid.  Pneumonia. EXAM: CHEST - 2 VIEW COMPARISON:  01/31/2021 FINDINGS: Chronic interstitial coarsening with hilar architectural distortion and apical volume loss. Fibrotic changes are worse at the left apex. As noted previously there may be superimposed pneumonia. Normal heart size. Stable mediastinal contours. Vertebroplasty of midthoracic levels. IMPRESSION: Sarcoid associated pulmonary fibrosis and history of pneumonia. No progression since 2 days ago. Electronically Signed   By: Monte Fantasia M.D.   On: 02/02/2021 08:14   CT Chest High Resolution  Result Date: 02/03/2021 CLINICAL DATA:  Cough with increased shortness of breath for 3 weeks. Sarcoid. EXAM: CT CHEST WITHOUT CONTRAST TECHNIQUE: Multidetector CT imaging of the chest was performed following the standard protocol without intravenous contrast. High resolution imaging of the lungs, as well as inspiratory and expiratory imaging, was performed. COMPARISON:  07/10/2020 and 02/20/2015. FINDINGS: Cardiovascular: Atherosclerotic calcification of the aorta, aortic valve and coronary arteries. Pulmonic trunk is enlarged. Heart is at the upper limits of normal in size. No pericardial effusion. Mediastinum/Nodes: No pathologically enlarged mediastinal lymph nodes. Hilar regions are difficult to evaluate without IV contrast but appear enlarged, as before. No axillary adenopathy. Esophagus is grossly unremarkable. Lungs/Pleura: Extensive upper and midlung zone predominant traction bronchiectasis, architectural distortion, interstitial coarsening, peribronchovascular nodularity and ground-glass, slightly progressive from 07/10/2020 in the lung bases. Associated subpleural cysts in the lung  bases. No pleural fluid. Airway is otherwise unremarkable. Upper Abdomen: Visualized portions of the liver, gallbladder, adrenal glands, kidneys, spleen, pancreas, stomach and bowel are grossly unremarkable with the exceptions of a small hiatal hernia and large duodenal diverticulum, incompletely imaged. Musculoskeletal: Degenerative changes in the spine. Vertebral body augmentations. T6 and T12 compression fractures are unchanged. There may be a new compression deformity of the T2 superior endplate. IMPRESSION: 1. Pulmonary parenchymal findings of end-stage sarcoid. Increased peribronchovascular nodularity and ground-glass in the lung bases may be due to disease progression or superimposed infectious bronchiolitis/bronchopneumonia. 2. New T2 superior endplate compression fracture. 3. Aortic atherosclerosis (ICD10-I70.0). Coronary artery calcification. 4. Enlarged pulmonic trunk, indicative of pulmonary arterial hypertension. Electronically Signed   By: Lorin Picket M.D.   On: 02/03/2021 11:07   CT FOOT LEFT W CONTRAST  Result Date: 02/01/2021 CLINICAL DATA:  Left foot infection. Recent amputation three weeks ago. EXAM: CT OF THE LOWER LEFT EXTREMITY WITH CONTRAST TECHNIQUE: Multidetector CT imaging of the lower left extremity was performed according to the standard protocol following intravenous contrast administration. CONTRAST:  56m OMNIPAQUE IOHEXOL 300 MG/ML  SOLN COMPARISON:  None. FINDINGS: Bones/Joint/Cartilage Prior great toe amputation to the level of the first metatarsal head. The surgical margin is not sharp and there is continued cortical irregularity. Prior second toe amputation to the level of the second metatarsal neck. The surgical margin appears sharp, however there is a small acute minimally displaced oblique fracture of the second metatarsal neck. Prior third toe amputation. No dislocation. Joint spaces are preserved. No joint effusion. Ligaments Ligaments are suboptimally evaluated by  CT. Muscles and  Tendons Grossly intact. Soft tissue Soft tissue thickening at the first and second toe amputation sites. No discrete fluid collection. No subcutaneous emphysema. No soft tissue mass. IMPRESSION: 1. Prior great toe amputation to the level of the first metatarsal head. The surgical margin is not sharp and there is continued cortical irregularity, concerning for osteomyelitis. No discrete abscess. 2. Prior second toe amputation to the level of the second metatarsal neck. The surgical margin appears sharp, however there is a small acute minimally displaced oblique fracture of the second metatarsal neck. Electronically Signed   By: Titus Dubin M.D.   On: 02/01/2021 16:11   ECHOCARDIOGRAM COMPLETE  Result Date: 02/02/2021    ECHOCARDIOGRAM REPORT   Patient Name:   Dawna Part Date of Exam: 02/02/2021 Medical Rec #:  [id number]         Height:       67.0 in Accession #:    [id number]        Weight:       169.8 lb Date of Birth:  02/16/1956          BSA:          1.886 m Patient Age:    65 years          BP:           122/79 mmHg Patient Gender: F                 HR:           97 bpm. Exam Location:  [facility] Procedure: 2D Echo, Cardiac Doppler, Color Doppler and Strain Analysis Indications:     Bacteremia R78.81  History:         Patient has prior history of Echocardiogram examinations, most                  recent 07/27/2020. Pulmonary HTN and Stroke;                  Signs/Symptoms:Murmur.  Sonographer:     Sherrie Sport RDCS (AE) Referring Phys:  [place] Diagnosing Phys: Nelva Bush MD  Sonographer Comments: Global longitudinal strain was attempted. IMPRESSIONS  1. Left ventricular ejection fraction, by estimation, is 60 to 65%. The left ventricle has normal function. Left ventricular endocardial border not optimally defined to evaluate regional wall motion. Left ventricular diastolic parameters are consistent with Grade I diastolic dysfunction (impaired relaxation). Elevated left  atrial pressure. The average left ventricular global longitudinal strain is -15.4 %. The global longitudinal strain is abnormal.  2. Right ventricular systolic function is normal. The right ventricular size is normal. There is severely elevated pulmonary artery systolic pressure.  3. The mitral valve is normal in structure. Trivial mitral valve regurgitation. No evidence of mitral stenosis.  4. Tricuspid valve regurgitation is moderate.  5. The aortic valve is tricuspid. There is mild thickening of the aortic valve. Aortic valve regurgitation is not visualized. Mild aortic valve sclerosis is present, with no evidence of aortic valve stenosis.  6. Aortic dilatation noted. There is mild dilatation of the ascending aorta, measuring 39 mm.  7. The inferior vena cava is normal in size with <50% respiratory variability, suggesting right atrial pressure of 8 mmHg. Conclusion(s)/Recommendation(s): No obvious vegetation identified, though native valvular disease is present. Consider transesophageal echocardiogram if clinical concern for endocarditis persists. FINDINGS  Left Ventricle: Left ventricular ejection fraction, by estimation, is 60 to 65%. The left ventricle has normal function. Left ventricular endocardial border  not optimally defined to evaluate regional wall motion. The average left ventricular global longitudinal strain is -15.4 %. The global longitudinal strain is abnormal. The left ventricular internal cavity size was normal in size. There is borderline left ventricular hypertrophy. Left ventricular diastolic parameters are consistent with Grade I diastolic dysfunction (impaired relaxation). Elevated left atrial pressure. Right Ventricle: The right ventricular size is normal. No increase in right ventricular wall thickness. Right ventricular systolic function is normal. There is severely elevated pulmonary artery systolic pressure. The tricuspid regurgitant velocity is 3.91 m/s, and with an assumed right  atrial pressure of 8 mmHg, the estimated right ventricular systolic pressure is 94.1 mmHg. Left Atrium: Left atrial size was normal in size. Right Atrium: Right atrial size was normal in size. Pericardium: The pericardium was not well visualized. Presence of pericardial fat pad. Mitral Valve: The mitral valve is normal in structure. There is mild thickening of the mitral valve leaflet(s). Mild mitral annular calcification. Trivial mitral valve regurgitation. No evidence of mitral valve stenosis. Tricuspid Valve: The tricuspid valve is normal in structure. Tricuspid valve regurgitation is moderate. Aortic Valve: The aortic valve is tricuspid. There is mild thickening of the aortic valve. Aortic valve regurgitation is not visualized. Mild aortic valve sclerosis is present, with no evidence of aortic valve stenosis. Aortic valve mean gradient measures 8.3 mmHg. Aortic valve peak gradient measures 15.6 mmHg. Aortic valve area, by VTI measures 1.95 cm. Pulmonic Valve: The pulmonic valve was normal in structure. Pulmonic valve regurgitation is trivial. No evidence of pulmonic stenosis. Aorta: The aortic root is normal in size and structure and aortic dilatation noted. There is mild dilatation of the ascending aorta, measuring 39 mm. Pulmonary Artery: The pulmonary artery is not well seen. Venous: The inferior vena cava is normal in size with less than 50% respiratory variability, suggesting right atrial pressure of 8 mmHg. IAS/Shunts: The interatrial septum was not well visualized.  LEFT VENTRICLE PLAX 2D LVIDd:         3.26 cm  Diastology LVIDs:         2.15 cm  LV e' medial:    5.55 cm/s LV PW:         1.10 cm  LV E/e' medial:  16.1 LV IVS:        0.80 cm  LV e' lateral:   6.20 cm/s LVOT diam:     2.00 cm  LV E/e' lateral: 14.4 LV SV:         68 LV SV Index:   36       2D Longitudinal Strain LVOT Area:     3.14 cm 2D Strain GLS Avg:     -15.4 %                          3D Volume EF:                         3D EF:         59 %                         LV EDV:       103 ml                         LV ESV:       43 ml  LV SV:        60 ml RIGHT VENTRICLE RV Basal diam:  3.23 cm RV S prime:     13.30 cm/s TAPSE (M-mode): 3.0 cm LEFT ATRIUM             Index       RIGHT ATRIUM           Index LA diam:        3.50 cm 1.86 cm/m  RA Area:     14.00 cm LA Vol (A2C):   24.3 ml 12.88 ml/m RA Volume:   34.80 ml  18.45 ml/m LA Vol (A4C):   24.8 ml 13.15 ml/m LA Biplane Vol: 25.9 ml 13.73 ml/m  AORTIC VALVE                    PULMONIC VALVE AV Area (Vmax):    1.75 cm     PV Vmax:        0.88 m/s AV Area (Vmean):   1.92 cm     PV Peak grad:   3.1 mmHg AV Area (VTI):     1.95 cm     RVOT Peak grad: 4 mmHg AV Vmax:           197.67 cm/s AV Vmean:          134.000 cm/s AV VTI:            0.348 m AV Peak Grad:      15.6 mmHg AV Mean Grad:      8.3 mmHg LVOT Vmax:         110.00 cm/s LVOT Vmean:        81.900 cm/s LVOT VTI:          0.216 m LVOT/AV VTI ratio: 0.62  AORTA Ao Root diam: 3.53 cm MITRAL VALVE                TRICUSPID VALVE MV Area (PHT): 3.45 cm     TR Peak grad:   61.2 mmHg MV Decel Time: 220 msec     TR Vmax:        391.00 cm/s MV E velocity: 89.10 cm/s MV A velocity: 120.00 cm/s  SHUNTS MV E/A ratio:  0.74         Systemic VTI:  0.22 m                             Systemic Diam: 2.00 cm Nelva Bush MD Electronically signed by Nelva Bush MD Signature Date/Time: 02/02/2021/12:58:19 PM    Final      Scheduled Meds: . aspirin EC  81 mg Oral Daily  . atorvastatin  40 mg Oral Daily  . dextromethorphan  15 mg Oral BID  . diclofenac Sodium  2 g Topical QID  . enoxaparin (LOVENOX) injection  40 mg Subcutaneous Q24H  . escitalopram  20 mg Oral Daily  . folic acid  1 mg Oral Daily  . ipratropium-albuterol  3 mL Nebulization TID  . levothyroxine  137 mcg Oral QAC breakfast  . methylPREDNISolone (SOLU-MEDROL) injection  40 mg Intravenous Q12H  . mometasone-formoterol  2 puff Inhalation BID  .  polyethylene glycol  17 g Oral Daily  . potassium citrate  20 mEq Oral TID WC  . senna-docusate  1 tablet Oral QHS  . Treprostinil  0.6 mcg Inhalation See admin instructions   Continuous Infusions: . azithromycin Stopped (02/02/21 1900)  . cefTRIAXone (  ROCEPHIN)  IV Stopped (02/02/21 1716)  . vancomycin 1,250 mg (02/02/21 1400)     LOS: 2 days   Phillips Climes, MD [facility] To contact the attending provider between 7A-7P or the covering provider during after hours 7P-7A, please log into the web site [email or web address] and access using universal [place] password for that web site. If you do not have the password, please call the hospital operator.  02/03/2021, 1:49 PM

## 2021-02-03 NOTE — TOC Progression Note (Signed)
Transition of Care [facility]) - Progression Note    Patient Details  Name: [name] MRN: [id number] Date of Birth: Jan 09, 1956  Transition of Care [facility] CM/SW Contact  Beverly Sessions, RN Phone Number: 02/03/2021, 1:14 PM  Clinical Narrative:     Per Marye Round with [facility] they are able to accept patient for home health services at discharge. Home health orders are in  Expected Discharge Plan: [place] Barriers to Discharge: Continued Medical Work up  Expected Discharge Plan and Services Expected Discharge Plan: [place]   Discharge Planning Services: CM Consult Post Acute Care Choice: [employer] arrangements for the past 2 months: [place]: RN,PT [place] Agency: [place] ([place]) Date [place]: 02/02/21   Representative spoke with at [place]: [place] ([place]) Interventions    Readmission Risk Interventions Readmission Risk Prevention Plan 07/17/2020  Transportation Screening Complete  PCP or Specialist Appt within 3-5 Days Complete  HRI or Home Care Consult Complete  Palliative Care Screening Complete  Medication Review (RN Care Manager) Complete  Some recent data might be hidden

## 2021-02-03 NOTE — Progress Notes (Signed)
Physical Therapy Treatment Patient Details Name: Stacie [name] MRN: [id number] DOB: 1956/05/29 Today's Date: 02/03/2021    History of Present Illness Pt is a 65 y.o. female presenting to hospital 4/17 with worsening SOB; about 1 week ago started developing cold-like symptoms (fevers, congestion, cough, and SOB); has also blacked out.  Pt orthostatic upon arrival and given IV fluids.  Pt admitted with acute on chronic respiratory failure with hypoxia, pulmonary sarcoidosis, pulmonary htn, and orthostasis.  during hospitalization, CT L foot taken and results noted: "Prior second toe amputation to the level of the second metatarsal  neck. The surgical margin appears sharp, however there is a small  acute minimally displaced oblique fracture of the second metatarsal  neck".  PMH includes sarcoidosis, home O2 baseline, acute respiratory failure with hypoxia, CKD, heart murmur, HOH, IBS, sleep apnea, stroke, L foot gangrene, critical lower limb ischemia, OSA, 1st toe ray amputation and 2nd metatarsal head resection 01/14/21, cardiac cath.    PT Comments    Pt resting in bed upon PT arrival; agreeable to PT session.  Pt issued medium size post op shoe for L foot for ambulation (appeared with appropriate fit for pt) d/t results of CT L foot (see precautions/restrictions below).  Tolerated LE seated ex's well.  Able to transfer with RW with SBA and then ambulate 160 feet x2 (with seated rest break between ambulation trials) with RW use.  Pt reporting L post op shoe felt comfortable and supportive during session.  Pt's HR 104 bpm at rest beginning of session but increased up to 128 bpm with ambulation (decreased back to 106 bpm within a few minutes of seated rest); O2 sats 94% or greater on 3 L O2 via nasal cannula for ambulation (pt on 2.5 L O2 but placed on 3 L for ambulation d/t O2 tank not having 2.5 L option).  SOB noted with activity but pt recovered within a few minutes of seated rest.  Will continue to  focus on strengthening and progressive functional mobility per pt tolerance.   Follow Up Recommendations  Home health PT     Equipment Recommendations  Rolling walker with 5" wheels    Recommendations for Other Services       Precautions / Restrictions Precautions Precautions: Fall Restrictions Weight Bearing Restrictions: Yes LLE Weight Bearing: Weight bearing as tolerated Other Position/Activity Restrictions: Per secure text with MD Dew (Vascular) 4/20 (regarding CT L foot results), MD Dew recommending pt WBAT L LE (also recommended post op shoe for ambulation)    Mobility  Bed Mobility Overal bed mobility: Modified Independent             General bed mobility comments: Semi-supine to/from sitting without any noted difficulties    Transfers Overall transfer level: Needs assistance Equipment used: Rolling walker (2 wheeled) Transfers: Sit to/from Stand Sit to Stand: Supervision         General transfer comment: steady strong transfers with RW; 1 vc to sit down slower onto bed (which pt did on next trial without further cueing)  Ambulation/Gait Ambulation/Gait assistance: Min guard;Supervision Gait Distance (Feet):  (160 feet x2) Assistive device: Rolling walker (2 wheeled)   Gait velocity: mildly decreased   General Gait Details: partial step through gait pattern; steady with RW use   Stairs             Wheelchair Mobility    Modified Rankin (Stroke Patients Only)       Balance Overall balance assessment: Needs assistance Sitting-balance support: No  upper extremity supported;Feet supported Sitting balance-Leahy Scale: Normal Sitting balance - Comments: steady sitting reaching within BOS   Standing balance support: No upper extremity supported Standing balance-Leahy Scale: Good Standing balance comment: steady standing reaching within BOS                            Cognition Arousal/Alertness: Awake/alert Behavior During  Therapy: WFL for tasks assessed/performed Overall Cognitive Status: Within Functional Limits for tasks assessed                                 General Comments: A&O x4      Exercises General Exercises - Lower Extremity Ankle Circles/Pumps: AROM;Strengthening;Both;Seated (10 reps x2 B LE's) Long Arc Quad: AROM;Strengthening;Both;Seated (10 reps x2 B LE's) Hip Flexion/Marching: AROM;Strengthening;Both;Seated (10 reps x2 B LE's)    General Comments   Nursing cleared pt for participation in physical therapy.  Pt agreeable to PT session.  Pt's son present during session.      Pertinent Vitals/Pain Pain Assessment: 0-10 Pain Score: 6  Pain Location: low back from coughing Pain Descriptors / Indicators: Spasm Pain Intervention(s): Limited activity within patient's tolerance;Monitored during session;Premedicated before session;Repositioned;Other (comment) (RN notified of pt's pain)    Home Living                      Prior Function            PT Goals (current goals can now be found in the care plan section) Acute Rehab PT Goals Patient Stated Goal: to improve mobility PT Goal Formulation: With patient Time For Goal Achievement: 02/15/21 Potential to Achieve Goals: Good Progress towards PT goals: Progressing toward goals    Frequency    Min 2X/week      PT Plan Current plan remains appropriate    Co-evaluation              AM-PAC PT "6 Clicks" Mobility   Outcome Measure  Help needed turning from your back to your side while in a flat bed without using bedrails?: None Help needed moving from lying on your back to sitting on the side of a flat bed without using bedrails?: None Help needed moving to and from a bed to a chair (including a wheelchair)?: A Little Help needed standing up from a chair using your arms (e.g., wheelchair or bedside chair)?: A Little Help needed to walk in hospital room?: A Little Help needed climbing 3-5 steps with  a railing? : A Little 6 Click Score: 20    End of Session Equipment Utilized During Treatment: Gait belt Activity Tolerance: Patient tolerated treatment well Patient left: in bed;with call bell/phone within reach;with bed alarm set;with family/visitor present Nurse Communication: Mobility status;Precautions;Other (comment) (pt's LBP and vitals during session) PT Visit Diagnosis: Unsteadiness on feet (R26.81);Other abnormalities of gait and mobility (R26.89);Muscle weakness (generalized) (M62.81)     Time: 2712-9290 PT Time Calculation (min) (ACUTE ONLY): 38 min  Charges:  $Gait Training: 8-22 mins $Therapeutic Exercise: 23-37 mins                    Leitha Bleak, PT 02/03/21, 11:40 AM

## 2021-02-04 ENCOUNTER — Inpatient Hospital Stay: Payer: Self-pay

## 2021-02-04 DIAGNOSIS — M869 Osteomyelitis, unspecified: Secondary | ICD-10-CM

## 2021-02-04 LAB — CULTURE, BLOOD (ROUTINE X 2)
Special Requests: ADEQUATE
Special Requests: ADEQUATE

## 2021-02-04 LAB — COMPREHENSIVE METABOLIC PANEL
ALT: 28 U/L (ref 0–44)
AST: 25 U/L (ref 15–41)
Albumin: 2.7 g/dL — ABNORMAL LOW (ref 3.5–5.0)
Alkaline Phosphatase: 71 U/L (ref 38–126)
Anion gap: 9 (ref 5–15)
BUN: 27 mg/dL — ABNORMAL HIGH (ref 8–23)
CO2: 33 mmol/L — ABNORMAL HIGH (ref 22–32)
Calcium: 9 mg/dL (ref 8.9–10.3)
Chloride: 100 mmol/L (ref 98–111)
Creatinine, Ser: 1.03 mg/dL — ABNORMAL HIGH (ref 0.44–1.00)
GFR, Estimated: >60 mL/min (ref >60)
Glucose, Bld: 172 mg/dL — ABNORMAL HIGH (ref 70–99)
Potassium: 5 mmol/L (ref 3.5–5.1)
Sodium: 142 mmol/L (ref 135–145)
Total Bilirubin: 0.6 mg/dL (ref 0.3–1.2)
Total Protein: 6.4 g/dL — ABNORMAL LOW (ref 6.5–8.1)

## 2021-02-04 LAB — CBC WITH DIFFERENTIAL/PLATELET
Abs Immature Granulocytes: 0.67 10*3/uL — ABNORMAL HIGH (ref 0.00–0.07)
Basophils Absolute: 0.1 10*3/uL (ref 0.0–0.1)
Basophils Relative: 0 %
Eosinophils Absolute: 0 10*3/uL (ref 0.0–0.5)
Eosinophils Relative: 0 %
HCT: 34.2 % — ABNORMAL LOW (ref 36.0–46.0)
Hemoglobin: 10.9 g/dL — ABNORMAL LOW (ref 12.0–15.0)
Immature Granulocytes: 3 %
Lymphocytes Relative: 3 %
Lymphs Abs: 0.6 10*3/uL — ABNORMAL LOW (ref 0.7–4.0)
MCH: 29.9 pg (ref 26.0–34.0)
MCHC: 31.9 g/dL (ref 30.0–36.0)
MCV: 93.7 fL (ref 80.0–100.0)
Monocytes Absolute: 1.3 10*3/uL — ABNORMAL HIGH (ref 0.1–1.0)
Monocytes Relative: 6 %
Neutro Abs: 18.8 10*3/uL — ABNORMAL HIGH (ref 1.7–7.7)
Neutrophils Relative %: 88 %
Platelets: 297 10*3/uL (ref 150–400)
RBC: 3.65 MIL/uL — ABNORMAL LOW (ref 3.87–5.11)
RDW: 15.1 % (ref 11.5–15.5)
WBC: 21.4 10*3/uL — ABNORMAL HIGH (ref 4.0–10.5)
nRBC: 0 % (ref 0.0–0.2)

## 2021-02-04 LAB — PROCALCITONIN: Procalcitonin: 0.37 ng/mL

## 2021-02-04 LAB — CK: Total CK: 45 U/L (ref 38–234)

## 2021-02-04 LAB — FUNGITELL, SERUM: Fungitell Result: 62 pg/mL (ref <80)

## 2021-02-04 MED ORDER — METHYLPREDNISOLONE SODIUM SUCC 40 MG IJ SOLR
40.0000 mg | Freq: Every day | INTRAMUSCULAR | Status: DC
  Administered 2021-02-04: 40 mg via INTRAVENOUS
  Filled 2021-02-04: qty 1

## 2021-02-04 MED ORDER — ATORVASTATIN CALCIUM 40 MG PO TABS: 40.0000 mg | ORAL_TABLET | Freq: Every day | ORAL | 3 refills | Status: DC

## 2021-02-04 MED ORDER — DAPTOMYCIN IV (FOR PTA / DISCHARGE USE ONLY): 700.0000 mg | INTRAVENOUS | 0 refills | Status: AC

## 2021-02-04 MED ORDER — CHLORHEXIDINE GLUCONATE CLOTH 2 % EX PADS
6.0000 | MEDICATED_PAD | Freq: Every day | CUTANEOUS | Status: DC
  Administered 2021-02-04: 6 via TOPICAL

## 2021-02-04 MED ORDER — METHOTREXATE 2.5 MG PO TABS: 25.0000 mg | ORAL_TABLET | ORAL | 0 refills | Status: DC

## 2021-02-04 MED ORDER — SODIUM CHLORIDE 0.9% FLUSH: 10.0000 mL | INTRAVENOUS | Status: DC | PRN

## 2021-02-04 MED ORDER — PREDNISONE 5 MG PO TABS: ORAL_TABLET | ORAL | 0 refills | Status: DC

## 2021-02-04 MED ORDER — CEFTRIAXONE IV (FOR PTA / DISCHARGE USE ONLY): 2.0000 g | INTRAVENOUS | 0 refills | Status: AC

## 2021-02-04 MED ORDER — SODIUM CHLORIDE 0.9% FLUSH
10.0000 mL | Freq: Two times a day (BID) | INTRAVENOUS | Status: DC
Start: 2021-02-04 — End: 2021-02-04
  Administered 2021-02-04: 10 mL

## 2021-02-04 MED ORDER — SODIUM CHLORIDE 0.9 % IV SOLN
700.0000 mg | Freq: Every day | INTRAVENOUS | Status: DC
  Administered 2021-02-04: 700 mg via INTRAVENOUS
  Filled 2021-02-04: qty 14

## 2021-02-04 NOTE — Progress Notes (Signed)
Occupational Therapy Treatment Patient Details Name: Stacie [name] MRN: [id number] DOB: 1956-09-08 Today's Date: 02/04/2021    History of present illness Pt is a 65 y.o. female presenting to hospital 4/17 with worsening SOB; about 1 week ago started developing cold-like symptoms (fevers, congestion, cough, and SOB); has also blacked out.  Pt orthostatic upon arrival and given IV fluids.  Pt admitted with acute on chronic respiratory failure with hypoxia, pulmonary sarcoidosis, pulmonary htn, and orthostasis.  during hospitalization, CT L foot taken and results noted: "Prior second toe amputation to the level of the second metatarsal  neck. The surgical margin appears sharp, however there is a small  acute minimally displaced oblique fracture of the second metatarsal  neck".  PMH includes sarcoidosis, home O2 baseline, acute respiratory failure with hypoxia, CKD, heart murmur, HOH, IBS, sleep apnea, stroke, L foot gangrene, critical lower limb ischemia, OSA, 1st toe ray amputation and 2nd metatarsal head resection 01/14/21, cardiac cath.   OT comments  Pt seen for OT tx this date. Pt endorses feeling much better than yesterday. Received standing and walking to the bathroom with the RN present to manage the IV pole. OT assisted the pt the rest of the way, requiring assist for IV pole mgt and MIN VC for safety as pt gets O2 tubing wrapped around her foot, not using post-op shoe on L foot (did not know where it was located, later OT found it in drawer), and not using AD for mobility in room. Pt performed toilet transfers with SUPV and toileting hygiene and clothing mgt with remote supervision. OT facilitated additional instruction in energy conservation strategies with emphasis on pursed lip breathing for breath recovery and activity pacing and positional changes to support safe participation in ADL and IADL tasks. Pt verbalized understanding. Continues to benefit from skilled OT Services while hospitalized.     Follow Up Recommendations  No OT follow up;Supervision - Intermittent    Equipment Recommendations  None recommended by OT    Recommendations for Other Services      Precautions / Restrictions Precautions Precautions: Fall Precaution Comments: monitor orthostatics Restrictions Weight Bearing Restrictions: Yes LLE Weight Bearing: Weight bearing as tolerated Other Position/Activity Restrictions: Per secure text with MD Dew (Vascular) 4/20 (regarding CT L foot results), MD Dew recommending pt WBAT L LE (also recommended post op shoe for ambulation)       Mobility Bed Mobility Overal bed mobility: Modified Independent                  Transfers Overall transfer level: Needs assistance Equipment used: None Transfers: Sit to/from Stand Sit to Stand: Supervision         General transfer comment: pt standing and ambulating to the bathroom with RN upon OT's arrival    Balance Overall balance assessment: Needs assistance Sitting-balance support: No upper extremity supported;Feet supported Sitting balance-Leahy Scale: Normal     Standing balance support: No upper extremity supported Standing balance-Leahy Scale: Fair Standing balance comment: fair, occasionally reaching out for door/counter for UE support                           ADL either performed or assessed with clinical judgement   ADL Overall ADL's : Needs assistance/impaired                     Lower Body Dressing: Modified independent;Sit to/from stand   Toilet Transfer: Ambulation;Regular Toilet;Supervision/safety   Toileting- Games developer  Manipulation and Hygiene: Modified independent;Sit to/from stand       Functional mobility during ADLs: Supervision/safety;Cueing for safety       Vision Patient Visual Report: No change from baseline     Perception     Praxis      Cognition Arousal/Alertness: Awake/alert Behavior During Therapy: WFL for tasks  assessed/performed Overall Cognitive Status: No family/caregiver present to determine baseline cognitive functioning                                 General Comments: mild safety awareness deficits - was not using post-op shoe when mobilizing, got O2 tubing wrapped around her foot and stated it happens "all the time"        Exercises Other Exercises Other Exercises: Pt performed toileting with remote supervision after transferring to the toilet with supervision, MIN VC for safety 2/2 long O2 tubing that was presenting as a fall hazard Other Exercises: Pt instructed in falls prevention strategies and encouraged use of post-op shoe per MD   Shoulder Instructions       General Comments      Pertinent Vitals/ Pain       Pain Assessment: No/denies pain  Home Living                                          Prior Functioning/Environment              Frequency  Min 1X/week        Progress Toward Goals  OT Goals(current goals can now be found in the care plan section)  Progress towards OT goals: Progressing toward goals  Acute Rehab OT Goals Patient Stated Goal: to improve mobility OT Goal Formulation: With patient Time For Goal Achievement: 02/15/21 Potential to Achieve Goals: Good  Plan Discharge plan remains appropriate;Frequency remains appropriate    Co-evaluation                 AM-PAC OT "6 Clicks" Daily Activity     Outcome Measure   Help from another person eating meals?: None Help from another person taking care of personal grooming?: None Help from another person toileting, which includes using toliet, bedpan, or urinal?: None Help from another person bathing (including washing, rinsing, drying)?: A Little Help from another person to put on and taking off regular upper body clothing?: None Help from another person to put on and taking off regular lower body clothing?: A Little 6 Click Score: 22    End of Session     OT Visit Diagnosis: Other abnormalities of gait and mobility (R26.89);History of falling (Z91.81)   Activity Tolerance Patient tolerated treatment well   Patient Left in bed;with call bell/phone within reach;Other (comment) (per RN don't need bed alarm)   Nurse Communication          Time: [phone] OT Time Calculation (min): 22 min  Charges: OT General Charges $OT Visit: 1 Visit OT Treatments $Self Care/Home Management : 8-22 mins  Hanley Hays, MPH, MS, OTR/L ascom [phone] 02/04/21, 3:57 PM

## 2021-02-04 NOTE — TOC Progression Note (Signed)
Transition of Care [facility]) - Progression Note    Patient Details  Name: CHALEY CASTELLANOS MRN: [id number] Date of Birth: Sep 27, 1956  Transition of Care [facility]) CM/SW [place], LCSW Phone Number: 02/04/2021, 11:36 AM  Clinical Narrative:   Patient will discharge on IV abx. [facility] is not able to provide home health RN for infusions due to patient's commercial insurance. [facility] will arrange a nurse through [facility] or [facility]. Patient is aware and agreeable. Plan on discharge home today.  Expected Discharge Plan: [place] Barriers to Discharge: Continued Medical Work up  Expected Discharge Plan and Services Expected Discharge Plan: [place]   Discharge Planning Services: CM Consult Post Acute Care Choice: [place] arrangements for the past 2 months: [place]: RN,PT [place] Agency: [place] ([place]) Date [place]: 02/02/21   Representative spoke with at [place]: [place] ([place]) Interventions    Readmission Risk Interventions Readmission Risk Prevention Plan 07/17/2020  Transportation Screening Complete  PCP or Specialist Appt within 3-5 Days Complete  HRI or Home Care Consult Complete  Palliative Care Screening Complete  Medication Review (RN Care Manager) Complete  Some recent data might be hidden

## 2021-02-04 NOTE — Plan of Care (Signed)
Pt Axox4. Calm and cooperative and able to voice her needs. Continued Dypsnea on exertion noted when pt using walker to use restroom. Varied treatments in place. Occasional nonproductive coughs noted as well. Prn cough med administered and effective. On CPAP overnight. On 2-3 L during the day. Dressings to L and R foot clean dry and intact. On tele. Safety measures in place. Will continue to monitor.  Problem: Education: Goal: Knowledge of General Education information will improve Description: Including pain rating scale, medication(s)/side effects and non-pharmacologic comfort measures Outcome: Progressing   Problem: Health Behavior/Discharge Planning: Goal: Ability to manage health-related needs will improve Outcome: Progressing   Problem: Clinical Measurements: Goal: Ability to maintain clinical measurements within normal limits will improve Outcome: Progressing Goal: Will remain free from infection Outcome: Progressing Goal: Diagnostic test results will improve Outcome: Progressing Goal: Respiratory complications will improve Outcome: Progressing Goal: Cardiovascular complication will be avoided Outcome: Progressing   Problem: Activity: Goal: Risk for activity intolerance will decrease Outcome: Progressing   Problem: Nutrition: Goal: Adequate nutrition will be maintained Outcome: Progressing   Problem: Coping: Goal: Level of anxiety will decrease Outcome: Progressing   Problem: Elimination: Goal: Will not experience complications related to bowel motility Outcome: Progressing Goal: Will not experience complications related to urinary retention Outcome: Progressing   Problem: Pain Managment: Goal: General experience of comfort will improve Outcome: Progressing   Problem: Safety: Goal: Ability to remain free from injury will improve Outcome: Progressing   Problem: Skin Integrity: Goal: Risk for impaired skin integrity will decrease Outcome: Progressing    Problem: Skin Integrity: Goal: Risk for impaired skin integrity will decrease Outcome: Progressing

## 2021-02-04 NOTE — Progress Notes (Signed)
Peripherally Inserted Central Catheter Placement  The IV Nurse has discussed with the patient and/or persons authorized to consent for the patient, the purpose of this procedure and the potential benefits and risks involved with this procedure.  The benefits include less needle sticks, lab draws from the catheter, and the patient may be discharged home with the catheter. Risks include, but not limited to, infection, bleeding, blood clot (thrombus formation), and puncture of an artery; nerve damage and irregular heartbeat and possibility to perform a PICC exchange if needed/ordered by physician.  Alternatives to this procedure were also discussed.  Bard Power PICC patient education guide, fact sheet on infection prevention and patient information card has been provided to patient /or left at bedside.    PICC Placement Documentation  PICC Single Lumen 86/75/44 Right Cephalic 35 cm 0 cm (Active)  Indication for Insertion or Continuance of Line Home intravenous therapies (PICC only) 02/04/21 1051  Exposed Catheter (cm) 0 cm 02/04/21 1051  Site Assessment Clean;Dry;Intact 02/04/21 1051  Line Status Flushed;Saline locked;Blood return noted 02/04/21 1051  Dressing Type Transparent 02/04/21 1051  Dressing Status Clean;Dry;Intact 02/04/21 1051  Antimicrobial disc in place? Yes 02/04/21 1051  Safety Lock Not Applicable 92/01/00 7121  Line Care Tubing changed;Connections checked and tightened 02/04/21 1051  Line Adjustment (NICU/IV Team Only) No 02/04/21 1051  Dressing Intervention New dressing 02/04/21 1051  Dressing Change Due 02/11/21 02/04/21 1051       Rolena Infante 02/04/2021, 10:52 AM

## 2021-02-04 NOTE — Progress Notes (Signed)
PHARMACY CONSULT NOTE FOR:  OUTPATIENT  PARENTERAL ANTIBIOTIC THERAPY (OPAT)  Indication: Indication: osteomyelitis and coagulase negative staphylococcus bacteremia Regimen: daptomycin 771m IV q24h and ceftriaxone 2gm IV q24h End date: 03/09/2021  IV antibiotic discharge orders are pended. To discharging provider:  please sign these orders via discharge navigator,  Select New Orders & click on the button choice - Manage This Unsigned Work.     Thank you for allowing pharmacy to be a part of this patient's care.  DDoreene Eland PharmD, BCPS.   Work Cell: [phone]/21/2022 12:24 PM

## 2021-02-04 NOTE — Progress Notes (Addendum)
Infectious Disease Long Term IV Antibiotic Orders Stacie [name] May 31, 1956  Diagnosis: Osteomyeltisi, bacteremia, Coag neg staph  Culture results Coag neg staph on blood cultures  LABS Lab Results  Component Value Date   CREATININE 1.03 (H) 02/04/2021   Lab Results  Component Value Date   WBC 21.4 (H) 02/04/2021   HGB 10.9 (L) 02/04/2021   HCT 34.2 (L) 02/04/2021   MCV 93.7 02/04/2021   PLT 297 02/04/2021   Lab Results  Component Value Date   ESRSEDRATE >140 (H) 02/02/2021   Lab Results  Component Value Date   CRP 14.1 (H) 02/02/2021    Allergies:  Allergies  Allergen Reactions  . Nitrofurantoin Nausea Only  . Plaquenil [Hydroxychloroquine]   . Tramadol Nausea And Vomiting  . Sulfa Antibiotics Rash    As an infant    Discharge antibiotics Ceftriaxone 2 grams every    24         hours Daptomycin 8 mg/kg mg every  24 hours   (weekly CPK)  PICC Care per protocol Labs weekly while on IV antibiotics -FAX weekly labs to [phone] CBC w diff   Comprehensive met panel CPK CRP   Planned duration of antibiotics 6 weeks   Stop date May 24th Follow up clinic date 2 weeks    Leonel Ramsay, MD

## 2021-02-04 NOTE — Discharge Summary (Addendum)
Physician Discharge Summary  ZELIE ASBILL VAP:[id number] DOB: 09/13/1956 DOA: 01/31/2021  PCP: Glean Hess, MD  Admit date: 01/31/2021 Discharge date: 02/04/2021  Admitted From: Home Disposition:  Home   Recommendations for Outpatient Follow-up:  1. Follow up with PCP in 1-2 weeks   Antibiotics regimen/PICC line care protocol per ID    Discharge antibiotics Ceftriaxone      2          grams every    24                    hours Daptomycin     8 mg/kg           mg every                     hours   (weekly CPK)  PICC Care per protocol Labs weekly while on IV antibiotics -FAX weekly labs to [id number] CBC w diff         Comprehensive met panel CPK CRP       Planned duration of antibiotics 6 weeks   Stop date        May 24th         Follow up clinic date 2 weeks                       Leonel Ramsay, MD            Home Health:YES Equipment/Devices: Has oxygen and CPAP at home  Discharge Condition:Stable CODE STATUS:FULL Diet recommendation: Heart Healthy  Brief/Interim Summary:  Sepsis in an immunocompromised host on methotrexate, steroids -Sepsis present on admission. -Source of sepsis related to pneumonia, as evident on imaging, as well to bacteremia and osteomyelitis, please see discussion below. -Physiology of sepsis resolved at time of discharge  Bacteremia of coag negative staph/osteomyelitis of 1st and third metatarsal bone -Blood cultures growing staph Hominis  and epidermidis - S/p first toe amputation and resection of part of the first metatarsal head as well as resection of the second metatarsal head on 01/14/2021. -MRI of left foot confirms osteomyelitis of first and third metatarsal bone. -ID input greatly appreciated, recommendation for 6 weeks of antibiotics daptomycin and Rocephin. -Surveillance blood culture -4/19, PICC line inserted prior to discharge on 4/21  Acute on chronic hypoxic respiratory failure -She is on 2 L nasal  cannula at home at baseline, requiring up to 4 L initially, currently oxygen requirement back to baseline -Possibly related to pulmonary sarcoidosis versus pneumonia, with known underlying OSA, pulmonary hypertension. -management per pulmonary, IV steroids during hospital stay, she will be discharged on prednisone taper to go back to her baseline of 15 milligrams of prednisone daily . -will recommend to hold methotrexate for the next 2 weeks and her admission with sepsis and bacteremia.    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 -Improved with hydration  Depression -Continue Lexapro 20 daily   Discharge Diagnoses:  Principal Problem:   Acute on chronic respiratory failure with hypoxia (HCC) Active Problems:   Pulmonary sarcoidosis (HCC)   CKD (chronic kidney disease) stage 3, GFR 30-59 ml/min (HCC)   Obstructive sleep apnea   Hypothyroidism, acquired   Pulmonary hypertension ([place])   Sepsis ([place])   History of CVA (cerebrovascular accident)    Discharge Instructions  Discharge Instructions    [facility] pharmacist to adjust dose for Vancomycin, Aminoglycosides and other anti-infective therapies as  requested by physician.   Complete by: As directed    [facility] to provide Cath Flo 38m   Complete by: As directed    Administer for PICC line occlusion and as ordered by physician for other access device issues.   Anaphylaxis Kit: Provided to treat any anaphylactic reaction to the medication being provided to the patient if First Dose or when requested by physician   Complete by: As directed    Epinephrine 153mml vial / amp: Administer 0.3m66m0.3ml40mubcutaneously once for moderate to severe anaphylaxis, nurse to call physician and pharmacy when reaction occurs and call 911 if needed for immediate care   Diphenhydramine 50mg59mIV vial: Administer 25-50mg 55mM PRN for first dose reaction, rash, itching, mild  reaction, nurse to call physician and pharmacy when reaction occurs   Sodium Chloride 0.9% NS 500ml I46mdminister if needed for hypovolemic blood pressure drop or as ordered by physician after call to physician with anaphylactic reaction   Change dressing on IV access line weekly and PRN   Complete by: As directed    Diet - low sodium heart healthy   Complete by: As directed    Discharge wound care:   Complete by: As directed    Change dressing to left foot Q day as follows: cleanse with moist NS, then cover with double-folded xeroform gauze and abd pad and kerlex   Flush IV access with Sodium Chloride 0.9% and Heparin 10 units/ml or 100 units/ml   Complete by: As directed    Home infusion instructions - [facility]   Complete by: As directed    Instructions: Flush IV access with Sodium Chloride 0.9% and Heparin 10units/ml or 100units/ml   Change dressing on IV access line: Weekly and PRN   Instructions Cath Flo 2mg: Ad80mister for PICC Line occlusion and as ordered by physician for other access device   [facility] pharmacist to adjust dose for: Vancomycin, Aminoglycosides and other anti-infective therapies as requested by physician   Increase activity slowly   Complete by: As directed    Method of administration may be changed at the discretion of home infusion pharmacist based upon assessment of the patient and/or caregiver's ability to self-administer the medication ordered   Complete by: As directed      Allergies as of 02/04/2021      Reactions   Nitrofurantoin Nausea Only   Plaquenil [hydroxychloroquine]    Tramadol Nausea And Vomiting   Sulfa Antibiotics Rash   As an infant      Medication List    TAKE these medications   acetaminophen 500 MG tablet Commonly known as: TYLENOL Take 500 mg by mouth every 6 (six) hours as needed for moderate pain or headache.   albuterol (2.5 MG/3ML) 0.083% nebulizer solution Commonly known as: PROVENTIL Inhale 2.5  mg into the lungs every 6 (six) hours as needed for wheezing or shortness of breath.   aspirin 81 MG EC tablet Take 1 tablet (81 mg total) by mouth daily. Swallow whole.   atorvastatin 40 MG tablet Commonly known as: LIPITOR Take 1 tablet (40 mg total) by mouth daily. Resume once daptomycin is finished Start taking on: Mar 11, 2021 What changed:   additional instructions  These instructions start on Mar 11, 2021. If you are unsure what to do until then, ask your doctor or other care provider.   budesonide-formoterol 80-4.5 MCG/ACT inhaler Commonly known as: Symbicort Take 2 puffs first thing in am and then another 2  puffs about 12 hours later. What changed:   how much to take  how to take this  when to take this  additional instructions   cefTRIAXone  IVPB Commonly known as: ROCEPHIN Inject 2 g into the vein daily. Indication: osteomyelitis and coagulase negative staphylococcus bacteremia First Dose: Yes Last Day of Therapy:  03/09/2021 Labs - Once weekly:  CBC/D, CMP, CRP and CPK Method of administration: IV Push Method of administration may be changed at the discretion of home infusion pharmacist based upon assessment of the patient and/or caregiver's ability to self-administer the medication ordered. Start taking on: February 05, 2021   daptomycin  IVPB Commonly known as: CUBICIN Inject 700 mg into the vein daily. Indication: osteomyelitis and coagulase negative staphylococcus bacteremia First Dose: Yes Last Day of Therapy:  03/09/2021 Labs - Once weekly:  CBC/D, CMP, CRP and CPK Method of administration: IV Push Method of administration may be changed at the discretion of home infusion pharmacist based upon assessment of the patient and/or caregiver's ability to self-administer the medication ordered. Start taking on: February 05, 2021   denosumab 60 MG/ML Sosy injection Commonly known as: PROLIA Inject 60 mg into the skin every 6 (six) months.   dorzolamide 2 %  ophthalmic solution Commonly known as: TRUSOPT Place 1 drop into both eyes 2 (two) times daily.   ergocalciferol 1.25 MG (50000 UT) capsule Commonly known as: VITAMIN D2 Take 50,000 Units by mouth once a week. On tuesday   escitalopram 20 MG tablet Commonly known as: LEXAPRO TAKE 1 TABLET BY MOUTH EVERY DAY   folic acid 1 MG tablet Commonly known as: FOLVITE Take 1 tablet (1 mg total) by mouth daily.   HYDROcodone-acetaminophen 5-325 MG tablet Commonly known as: Norco Take 1 tablet by mouth every 6 (six) hours as needed for moderate pain.   latanoprost 0.005 % ophthalmic solution Commonly known as: XALATAN Place 1 drop into the right eye at bedtime.   levothyroxine 137 MCG tablet Commonly known as: Synthroid TAKE 1 TABLET (137 MCG TOTAL) BY MOUTH DAILY BEFORE BREAKFAST. What changed:   how much to take  how to take this  when to take this  additional instructions   methotrexate 2.5 MG tablet Commonly known as: RHEUMATREX Take 10 tablets (25 mg total) by mouth every Tuesday. Start taking on: Feb 23, 2021 What changed: These instructions start on Feb 23, 2021. If you are unsure what to do until then, ask your doctor or other care provider.   multivitamin with minerals tablet Take 1 tablet by mouth daily.   NON FORMULARY CPAP nightly   OXYGEN Inhale 2 L into the lungs daily as needed (During walking and activity).   potassium citrate 10 MEQ (1080 MG) SR tablet Commonly known as: UROCIT-K Take 2 tablets (20 mEq total) by mouth 3 (three) times daily with meals.   predniSONE 5 MG tablet Commonly known as: DELTASONE Please take 40 mg oral daily x2 days, then 30 mg oral daily x2 days, then continue 15 mg oral daily after that. What changed:   how much to take  how to take this  when to take this  additional instructions   Treprostinil 0.6 MG/ML Soln Commonly known as: TYVASO Inhale 0.6 mcg into the lungs See admin instructions. 12 breaths 4 times a day    VITAMIN B COMPLEX PO Take 1 tablet by mouth daily.            Discharge Care Instructions  (From admission, onward)  Start     Ordered   02/04/21 0000  Change dressing on IV access line weekly and PRN  (Home infusion instructions - [facility] )        02/04/21 1356   02/04/21 0000  Discharge wound care:       Comments: Change dressing to left foot Q day as follows: cleanse with moist NS, then cover with double-folded xeroform gauze and abd pad and kerlex   02/04/21 1411          Follow-up Information    Dew, Erskine Squibb, MD In 2 weeks.   Specialties: Vascular Surgery, Radiology, Interventional Cardiology Why: For wound re-check Contact information: [place] [address] [phone]        Glean Hess, MD Follow up in 1 week(s).   Specialty: Internal Medicine Contact information: [place] [address] [phone]              Allergies  Allergen Reactions  . Nitrofurantoin Nausea Only  . Plaquenil [Hydroxychloroquine]   . Tramadol Nausea And Vomiting  . Sulfa Antibiotics Rash    As an infant    Consultations: Vascular surgery Pulmonary/critical care Infectious disease   Procedures/Studies: DG Chest 2 View  Result Date: 02/02/2021 CLINICAL DATA:  Pulmonary sarcoid.  Pneumonia. EXAM: CHEST - 2 VIEW COMPARISON:  01/31/2021 FINDINGS: Chronic interstitial coarsening with hilar architectural distortion and apical volume loss. Fibrotic changes are worse at the left apex. As noted previously there may be superimposed pneumonia. Normal heart size. Stable mediastinal contours. Vertebroplasty of midthoracic levels. IMPRESSION: Sarcoid associated pulmonary fibrosis and history of pneumonia. No progression since 2 days ago. Electronically Signed   By: Monte Fantasia M.D.   On: 02/02/2021 08:14   CT Chest High Resolution  Result Date: 02/03/2021 CLINICAL DATA:  Cough with increased shortness of breath for 3  weeks. Sarcoid. EXAM: CT CHEST WITHOUT CONTRAST TECHNIQUE: Multidetector CT imaging of the chest was performed following the standard protocol without intravenous contrast. High resolution imaging of the lungs, as well as inspiratory and expiratory imaging, was performed. COMPARISON:  07/10/2020 and 02/20/2015. FINDINGS: Cardiovascular: Atherosclerotic calcification of the aorta, aortic valve and coronary arteries. Pulmonic trunk is enlarged. Heart is at the upper limits of normal in size. No pericardial effusion. Mediastinum/Nodes: No pathologically enlarged mediastinal lymph nodes. Hilar regions are difficult to evaluate without IV contrast but appear enlarged, as before. No axillary adenopathy. Esophagus is grossly unremarkable. Lungs/Pleura: Extensive upper and midlung zone predominant traction bronchiectasis, architectural distortion, interstitial coarsening, peribronchovascular nodularity and ground-glass, slightly progressive from 07/10/2020 in the lung bases. Associated subpleural cysts in the lung bases. No pleural fluid. Airway is otherwise unremarkable. Upper Abdomen: Visualized portions of the liver, gallbladder, adrenal glands, kidneys, spleen, pancreas, stomach and bowel are grossly unremarkable with the exceptions of a small hiatal hernia and large duodenal diverticulum, incompletely imaged. Musculoskeletal: Degenerative changes in the spine. Vertebral body augmentations. T6 and T12 compression fractures are unchanged. There may be a new compression deformity of the T2 superior endplate. IMPRESSION: 1. Pulmonary parenchymal findings of end-stage sarcoid. Increased peribronchovascular nodularity and ground-glass in the lung bases may be due to disease progression or superimposed infectious bronchiolitis/bronchopneumonia. 2. New T2 superior endplate compression fracture. 3. Aortic atherosclerosis (ICD10-I70.0). Coronary artery calcification. 4. Enlarged pulmonic trunk, indicative of pulmonary arterial  hypertension. Electronically Signed   By: Lorin Picket M.D.   On: 02/03/2021 11:07   CT FOOT LEFT W CONTRAST  Result Date: 02/01/2021 CLINICAL DATA:  Left foot infection. Recent amputation three weeks ago. EXAM: CT OF THE LOWER LEFT EXTREMITY WITH CONTRAST TECHNIQUE: Multidetector CT imaging of the lower left extremity was performed according to the standard protocol following intravenous contrast administration. CONTRAST:  6m OMNIPAQUE IOHEXOL 300 MG/ML  SOLN COMPARISON:  None. FINDINGS: Bones/Joint/Cartilage Prior great toe amputation to the level of the first metatarsal head. The surgical margin is not sharp and there is continued cortical irregularity. Prior second toe amputation to the level of the second metatarsal neck. The surgical margin appears sharp, however there is a small acute minimally displaced oblique fracture of the second metatarsal neck. Prior third toe amputation. No dislocation. Joint spaces are preserved. No joint effusion. Ligaments Ligaments are suboptimally evaluated by CT. Muscles and Tendons Grossly intact. Soft tissue Soft tissue thickening at the first and second toe amputation sites. No discrete fluid collection. No subcutaneous emphysema. No soft tissue mass. IMPRESSION: 1. Prior great toe amputation to the level of the first metatarsal head. The surgical margin is not sharp and there is continued cortical irregularity, concerning for osteomyelitis. No discrete abscess. 2. Prior second toe amputation to the level of the second metatarsal neck. The surgical margin appears sharp, however there is a small acute minimally displaced oblique fracture of the second metatarsal neck. Electronically Signed   By: WTitus DubinM.D.   On: 02/01/2021 16:11   MR FOOT LEFT W WO CONTRAST  Result Date: 02/03/2021 CLINICAL DATA:  Patient status post amputation of the left great toe and second metatarsal head 01/14/2021. Prior history of third toe amputation. Continued left foot  infection. EXAM: MRI OF THE LEFT FOREFOOT WITHOUT AND WITH CONTRAST TECHNIQUE: Multiplanar, multisequence MR imaging of the left forefoot was performed both before and after administration of intravenous contrast. CONTRAST:  8 mL GADAVIST IV SOLN COMPARISON:  CT left foot 02/01/2021. FINDINGS: Bones/Joint/Cartilage Postoperative change of amputation at the level of the head of the first metatarsal again seen. There is edema and enhancement in the distal 2.4 cm of the first metatarsal which is most intense in the distal 1.1 cm of the first metatarsal. Intense edema and enhancement are also seen in the medial and lateral sesamoids of the first MTP joint. The patient is status post amputation at the level of the neck of the second metatarsal. There is minimal edema and enhancement at the surgical site most consistent with postoperative change. The third toe has been amputated. There is intense edema and enhancement in the head and neck of the third metatarsal extending approximately 1.6 cm in length. Bone marrow signal is otherwise unremarkable. No joint effusion. Ligaments Negative. Muscles and Tendons No intramuscular fluid collection inflammatory change. Soft tissues Negative for abscess. Skin wound at the stump of the first metatarsal head noted. IMPRESSION: Intense edema in the distal 1.1 cm of the first metatarsal, medial and lateral sesamoids of the first MTP joint and distal 1.6 cm of the third metatarsal consistent with osteomyelitis. Milder degree of edema extending approximately 1.3 cm in the distal diaphysis of the first metatarsal could be due to early osteomyelitis or may reflect reactive change. Negative for septic joint or myositis. Electronically Signed   By: TInge RiseM.D.   On: 02/03/2021 19:17   DG Chest Port 1 View  Result Date: 01/31/2021 CLINICAL DATA:  Dyspnea, syncope EXAM: PORTABLE CHEST 1 VIEW COMPARISON:  07/20/2020, CT 07/10/2020 FINDINGS: There is progressive biapical pulmonary  infiltrate, more severe within the left upper lobe as well as coarse infiltrate at  the right lung base. This may represent interval progression of disease involving the patient's known underlying sarcoidosis or reflect superimposed acute inflammatory changes related to atypical infection upon underlying interstitial lung disease. Stable left basilar pleural thickening. No pneumothorax or pleural effusion. Cardiac size is within normal limits. Hilar enlargement is noted and relates to central pulmonary arterial enlargement better seen on prior CT examination. Multilevel vertebroplasty again noted. IMPRESSION: Progressive asymmetric apically predominant pulmonary infiltrates, more severe within the left apex. This represents either progression of the patient's underlying sarcoidosis or acute on chronic disease related to atypical infection in the acute setting. Stable hilar enlargement in keeping with pulmonary arterial hypertension. Electronically Signed   By: Fidela Salisbury MD   On: 01/31/2021 17:27   ECHOCARDIOGRAM COMPLETE  Result Date: 02/02/2021    ECHOCARDIOGRAM REPORT   Patient Name:   Dawna Part Date of Exam: 02/02/2021 Medical Rec #:  [id number]         Height:       67.0 in Accession #:    [id number]        Weight:       169.8 lb Date of Birth:  03/22/1956          BSA:          1.886 m Patient Age:    84 years          BP:           122/79 mmHg Patient Gender: F                 HR:           97 bpm. Exam Location:  [facility] Procedure: 2D Echo, Cardiac Doppler, Color Doppler and Strain Analysis Indications:     Bacteremia R78.81  History:         Patient has prior history of Echocardiogram examinations, most                  recent 07/27/2020. Pulmonary HTN and Stroke;                  Signs/Symptoms:Murmur.  Sonographer:     Sherrie Sport RDCS (AE) Referring Phys:  [place] Diagnosing Phys: Nelva Bush MD  Sonographer Comments: Global longitudinal strain was attempted. IMPRESSIONS  1.  Left ventricular ejection fraction, by estimation, is 60 to 65%. The left ventricle has normal function. Left ventricular endocardial border not optimally defined to evaluate regional wall motion. Left ventricular diastolic parameters are consistent with Grade I diastolic dysfunction (impaired relaxation). Elevated left atrial pressure. The average left ventricular global longitudinal strain is -15.4 %. The global longitudinal strain is abnormal.  2. Right ventricular systolic function is normal. The right ventricular size is normal. There is severely elevated pulmonary artery systolic pressure.  3. The mitral valve is normal in structure. Trivial mitral valve regurgitation. No evidence of mitral stenosis.  4. Tricuspid valve regurgitation is moderate.  5. The aortic valve is tricuspid. There is mild thickening of the aortic valve. Aortic valve regurgitation is not visualized. Mild aortic valve sclerosis is present, with no evidence of aortic valve stenosis.  6. Aortic dilatation noted. There is mild dilatation of the ascending aorta, measuring 39 mm.  7. The inferior vena cava is normal in size with <50% respiratory variability, suggesting right atrial pressure of 8 mmHg. Conclusion(s)/Recommendation(s): No obvious vegetation identified, though native valvular disease is present. Consider transesophageal echocardiogram if clinical concern for endocarditis persists. FINDINGS  Left  Ventricle: Left ventricular ejection fraction, by estimation, is 60 to 65%. The left ventricle has normal function. Left ventricular endocardial border not optimally defined to evaluate regional wall motion. The average left ventricular global longitudinal strain is -15.4 %. The global longitudinal strain is abnormal. The left ventricular internal cavity size was normal in size. There is borderline left ventricular hypertrophy. Left ventricular diastolic parameters are consistent with Grade I diastolic dysfunction (impaired relaxation).  Elevated left atrial pressure. Right Ventricle: The right ventricular size is normal. No increase in right ventricular wall thickness. Right ventricular systolic function is normal. There is severely elevated pulmonary artery systolic pressure. The tricuspid regurgitant velocity is 3.91 m/s, and with an assumed right atrial pressure of 8 mmHg, the estimated right ventricular systolic pressure is 74.0 mmHg. Left Atrium: Left atrial size was normal in size. Right Atrium: Right atrial size was normal in size. Pericardium: The pericardium was not well visualized. Presence of pericardial fat pad. Mitral Valve: The mitral valve is normal in structure. There is mild thickening of the mitral valve leaflet(s). Mild mitral annular calcification. Trivial mitral valve regurgitation. No evidence of mitral valve stenosis. Tricuspid Valve: The tricuspid valve is normal in structure. Tricuspid valve regurgitation is moderate. Aortic Valve: The aortic valve is tricuspid. There is mild thickening of the aortic valve. Aortic valve regurgitation is not visualized. Mild aortic valve sclerosis is present, with no evidence of aortic valve stenosis. Aortic valve mean gradient measures 8.3 mmHg. Aortic valve peak gradient measures 15.6 mmHg. Aortic valve area, by VTI measures 1.95 cm. Pulmonic Valve: The pulmonic valve was normal in structure. Pulmonic valve regurgitation is trivial. No evidence of pulmonic stenosis. Aorta: The aortic root is normal in size and structure and aortic dilatation noted. There is mild dilatation of the ascending aorta, measuring 39 mm. Pulmonary Artery: The pulmonary artery is not well seen. Venous: The inferior vena cava is normal in size with less than 50% respiratory variability, suggesting right atrial pressure of 8 mmHg. IAS/Shunts: The interatrial septum was not well visualized.  LEFT VENTRICLE PLAX 2D LVIDd:         3.26 cm  Diastology LVIDs:         2.15 cm  LV e' medial:    5.55 cm/s LV PW:          1.10 cm  LV E/e' medial:  16.1 LV IVS:        0.80 cm  LV e' lateral:   6.20 cm/s LVOT diam:     2.00 cm  LV E/e' lateral: 14.4 LV SV:         68 LV SV Index:   36       2D Longitudinal Strain LVOT Area:     3.14 cm 2D Strain GLS Avg:     -15.4 %                          3D Volume EF:                         3D EF:        59 %                         LV EDV:       103 ml  LV ESV:       43 ml                         LV SV:        60 ml RIGHT VENTRICLE RV Basal diam:  3.23 cm RV S prime:     13.30 cm/s TAPSE (M-mode): 3.0 cm LEFT ATRIUM             Index       RIGHT ATRIUM           Index LA diam:        3.50 cm 1.86 cm/m  RA Area:     14.00 cm LA Vol (A2C):   24.3 ml 12.88 ml/m RA Volume:   34.80 ml  18.45 ml/m LA Vol (A4C):   24.8 ml 13.15 ml/m LA Biplane Vol: 25.9 ml 13.73 ml/m  AORTIC VALVE                    PULMONIC VALVE AV Area (Vmax):    1.75 cm     PV Vmax:        0.88 m/s AV Area (Vmean):   1.92 cm     PV Peak grad:   3.1 mmHg AV Area (VTI):     1.95 cm     RVOT Peak grad: 4 mmHg AV Vmax:           197.67 cm/s AV Vmean:          134.000 cm/s AV VTI:            0.348 m AV Peak Grad:      15.6 mmHg AV Mean Grad:      8.3 mmHg LVOT Vmax:         110.00 cm/s LVOT Vmean:        81.900 cm/s LVOT VTI:          0.216 m LVOT/AV VTI ratio: 0.62  AORTA Ao Root diam: 3.53 cm MITRAL VALVE                TRICUSPID VALVE MV Area (PHT): 3.45 cm     TR Peak grad:   61.2 mmHg MV Decel Time: 220 msec     TR Vmax:        391.00 cm/s MV E velocity: 89.10 cm/s MV A velocity: 120.00 cm/s  SHUNTS MV E/A ratio:  0.74         Systemic VTI:  0.22 m                             Systemic Diam: 2.00 cm Nelva Bush MD Electronically signed by Nelva Bush MD Signature Date/Time: 02/02/2021/12:58:19 PM    Final    [place] EKG SITE RITE  Result Date: 02/04/2021 If Site Rite image not attached, placement could not be confirmed due to current cardiac rhythm.     Subjective:   Discharge  Exam: Vitals:   02/04/21 0804 02/04/21 1100  BP:  135/76  Pulse:  96  Resp:  18  Temp:  (!) 97.5 F (36.4 C)  SpO2: 96% 97%   Vitals:   02/04/21 0455 02/04/21 0801 02/04/21 0804 02/04/21 1100  BP: (!) 147/93 (!) 166/105  135/76  Pulse: 89 91  96  Resp: _0 Temp: 98.3 F (36.8 C) 98 F (36.7 C)  (!) 97.5 F (36.4 C)  TempSrc:  Oral  Oral  SpO2: 96% 98% 96% 97%  Weight:      Height:        General: Pt is alert, awake, not in acute distress Cardiovascular: RRR, S1/S2 +, no rubs, no gallops Respiratory: CTA bilaterally, no wheezing, scattered  rhonchi Abdominal: Soft, NT, ND, bowel sounds + Extremities: no edema, no cyanosis, left foot bandaged    The results of significant diagnostics from this hospitalization (including imaging, microbiology, ancillary and laboratory) are listed below for reference.     Microbiology: Recent Results (from the past 240 hour(s))  Resp Panel by RT-PCR (Flu A&B, Covid) Nasopharyngeal Swab     Status: None   Collection Time: 01/31/21  4:06 PM   Specimen: Nasopharyngeal Swab; Nasopharyngeal(NP) swabs in vial transport medium  Result Value Ref Range Status   SARS Coronavirus 2 by RT PCR NEGATIVE NEGATIVE Final    Comment: (NOTE) SARS-CoV-2 target nucleic acids are NOT DETECTED.  The SARS-CoV-2 RNA is generally detectable in upper respiratory specimens during the acute phase of infection. The lowest concentration of SARS-CoV-2 viral copies this assay can detect is 138 copies/mL. A negative result does not preclude SARS-Cov-2 infection and should not be used as the sole basis for treatment or other patient management decisions. A negative result may occur with  improper specimen collection/handling, submission of specimen other than nasopharyngeal swab, presence of viral mutation(s) within the areas targeted by this assay, and inadequate number of viral copies(<138 copies/mL). A negative result must be combined with clinical  observations, patient history, and epidemiological information. The expected result is Negative.  Fact Sheet for Patients:  EntrepreneurPulse.com.au  Fact Sheet for Healthcare Providers:  IncredibleEmployment.be  This test is no t yet approved or cleared by the [place] FDA and  has been authorized for detection and/or diagnosis of SARS-CoV-2 by FDA under an Emergency Use Authorization (EUA). This EUA will remain  in effect (meaning this test can be used) for the duration of the COVID-19 declaration under Section 564(b)(1) of the Act, 21 U.S.C.section 360bbb-3(b)(1), unless the authorization is terminated  or revoked sooner.       Influenza A by PCR NEGATIVE NEGATIVE Final   Influenza B by PCR NEGATIVE NEGATIVE Final    Comment: (NOTE) The Xpert Xpress SARS-CoV-2/FLU/RSV plus assay is intended as an aid in the diagnosis of influenza from Nasopharyngeal swab specimens and should not be used as a sole basis for treatment. Nasal washings and aspirates are unacceptable for Xpert Xpress SARS-CoV-2/FLU/RSV testing.  Fact Sheet for Patients: EntrepreneurPulse.com.au  Fact Sheet for Healthcare Providers: IncredibleEmployment.be  This test is not yet approved or cleared by the [place] FDA and has been authorized for detection and/or diagnosis of SARS-CoV-2 by FDA under an Emergency Use Authorization (EUA). This EUA will remain in effect (meaning this test can be used) for the duration of the COVID-19 declaration under Section 564(b)(1) of the Act, 21 U.S.C. section 360bbb-3(b)(1), unless the authorization is terminated or revoked.  Performed at [facility], [address] [place], [place] [address]   Blood culture (routine x 2)     Status: Abnormal   Collection Time: 01/31/21  5:53 PM   Specimen: BLOOD  Result Value Ref Range Status   Specimen Description   Final    BLOOD RIGHT  ANTECUBITAL Performed at [facility], [address], [place], [place] [address]    Special Requests   Final    BOTTLES DRAWN AEROBIC AND ANAEROBIC Blood Culture adequate  volume Performed at [facility], [address] [place], [place] [address]    Culture  Setup Time   Final    GRAM POSITIVE COCCI IN BOTH AEROBIC AND ANAEROBIC BOTTLES CRITICAL VALUE NOTED.  VALUE IS CONSISTENT WITH PREVIOUSLY REPORTED AND CALLED VALUE. Performed at [facility], [address] [place], [place] [address]    Culture (A)  Final    STAPHYLOCOCCUS HOMINIS STAPHYLOCOCCUS EPIDERMIDIS SUSCEPTIBILITIES PERFORMED ON PREVIOUS CULTURE WITHIN THE LAST 5 DAYS. Performed at [facility], [place] [address] [place], [place] [address]    Report Status 02/04/2021 FINAL  Final  Blood culture (routine x 2)     Status: Abnormal   Collection Time: 01/31/21  5:53 PM   Specimen: BLOOD  Result Value Ref Range Status   Specimen Description   Final    BLOOD BLOOD LEFT HAND Performed at [facility], [address], [place], [place] [address]    Special Requests   Final    BOTTLES DRAWN AEROBIC AND ANAEROBIC Blood Culture adequate volume Performed at [facility], [address] [place], [place] [address]    Culture  Setup Time   Final    GRAM POSITIVE COCCI IN BOTH AEROBIC AND ANAEROBIC BOTTLES CRITICAL RESULT CALLED TO, READ BACK BY AND VERIFIED WITH: Laqueta Carina AT 1243 02/01/21 SDR Performed at [facility], [place] [address], [place], [place] [address]    Culture (A)  Final    STAPHYLOCOCCUS EPIDERMIDIS STAPHYLOCOCCUS HOMINIS    Report Status 02/04/2021 FINAL  Final   Organism ID, Bacteria STAPHYLOCOCCUS EPIDERMIDIS  Final   Organism ID, Bacteria STAPHYLOCOCCUS HOMINIS  Final      Susceptibility   Staphylococcus epidermidis - MIC*    CIPROFLOXACIN >=8 RESISTANT Resistant     ERYTHROMYCIN >=8 RESISTANT Resistant     GENTAMICIN >=16 RESISTANT  Resistant     OXACILLIN >=4 RESISTANT Resistant     TETRACYCLINE >=16 RESISTANT Resistant     VANCOMYCIN 1 SENSITIVE Sensitive     TRIMETH/SULFA 80 RESISTANT Resistant     CLINDAMYCIN >=8 RESISTANT Resistant     RIFAMPIN <=0.5 SENSITIVE Sensitive     Inducible Clindamycin NEGATIVE Sensitive     * STAPHYLOCOCCUS EPIDERMIDIS   Staphylococcus hominis - MIC*    CIPROFLOXACIN >=8 RESISTANT Resistant     ERYTHROMYCIN >=8 RESISTANT Resistant     GENTAMICIN <=0.5 SENSITIVE Sensitive     OXACILLIN >=4 RESISTANT Resistant     TETRACYCLINE <=1 SENSITIVE Sensitive     VANCOMYCIN 1 SENSITIVE Sensitive     TRIMETH/SULFA 80 RESISTANT Resistant     CLINDAMYCIN RESISTANT Resistant     RIFAMPIN <=0.5 SENSITIVE Sensitive     Inducible Clindamycin POSITIVE Resistant     * STAPHYLOCOCCUS HOMINIS  Blood Culture ID Panel (Reflexed)     Status: Abnormal   Collection Time: 01/31/21  5:53 PM  Result Value Ref Range Status   Enterococcus faecalis NOT DETECTED NOT DETECTED Final   Enterococcus Faecium NOT DETECTED NOT DETECTED Final   Listeria monocytogenes NOT DETECTED NOT DETECTED Final   Staphylococcus species DETECTED (A) NOT DETECTED Final    Comment: CRITICAL RESULT CALLED TO, READ BACK BY AND VERIFIED WITH:  SUSAN WATSON AT 1243 02/01/21 SDR    Staphylococcus aureus (BCID) NOT DETECTED NOT DETECTED Final   Staphylococcus epidermidis DETECTED (A) NOT DETECTED Final    Comment: Methicillin (oxacillin) resistant coagulase negative staphylococcus. Possible blood culture contaminant (unless isolated from more than one blood  culture draw or clinical case suggests pathogenicity). No antibiotic treatment is indicated for blood  culture contaminants. CRITICAL RESULT CALLED TO, READ BACK BY AND VERIFIED WITH:  SUSAN WATSON AT 9233 02/01/21 SDR    Staphylococcus lugdunensis NOT DETECTED NOT DETECTED Final   Streptococcus species NOT DETECTED NOT DETECTED Final   Streptococcus agalactiae NOT DETECTED NOT  DETECTED Final   Streptococcus pneumoniae NOT DETECTED NOT DETECTED Final   Streptococcus pyogenes NOT DETECTED NOT DETECTED Final   A.calcoaceticus-baumannii NOT DETECTED NOT DETECTED Final   Bacteroides fragilis NOT DETECTED NOT DETECTED Final   Enterobacterales NOT DETECTED NOT DETECTED Final   Enterobacter cloacae complex NOT DETECTED NOT DETECTED Final   Escherichia coli NOT DETECTED NOT DETECTED Final   Klebsiella aerogenes NOT DETECTED NOT DETECTED Final   Klebsiella oxytoca NOT DETECTED NOT DETECTED Final   Klebsiella pneumoniae NOT DETECTED NOT DETECTED Final   Proteus species NOT DETECTED NOT DETECTED Final   Salmonella species NOT DETECTED NOT DETECTED Final   Serratia marcescens NOT DETECTED NOT DETECTED Final   Haemophilus influenzae NOT DETECTED NOT DETECTED Final   Neisseria meningitidis NOT DETECTED NOT DETECTED Final   Pseudomonas aeruginosa NOT DETECTED NOT DETECTED Final   Stenotrophomonas maltophilia NOT DETECTED NOT DETECTED Final   Candida albicans NOT DETECTED NOT DETECTED Final   Candida auris NOT DETECTED NOT DETECTED Final   Candida glabrata NOT DETECTED NOT DETECTED Final   Candida krusei NOT DETECTED NOT DETECTED Final   Candida parapsilosis NOT DETECTED NOT DETECTED Final   Candida tropicalis NOT DETECTED NOT DETECTED Final   Cryptococcus neoformans/gattii NOT DETECTED NOT DETECTED Final   Methicillin resistance mecA/C DETECTED (A) NOT DETECTED Final    Comment: CRITICAL RESULT CALLED TO, READ BACK BY AND VERIFIED WITH:  SUSAN WATSON AT 1243 02/01/21 SDR Performed at [facility], [address] [place], [place] [address]   Respiratory (~20 pathogens) panel by PCR     Status: None   Collection Time: 02/01/21 12:44 PM   Specimen: Nasopharyngeal Swab; Respiratory  Result Value Ref Range Status   Adenovirus NOT DETECTED NOT DETECTED Final   Coronavirus 229E NOT DETECTED NOT DETECTED Final    Comment: (NOTE) The Coronavirus on the Respiratory  Panel, DOES NOT test for the novel  Coronavirus (2019 nCoV)    Coronavirus HKU1 NOT DETECTED NOT DETECTED Final   Coronavirus NL63 NOT DETECTED NOT DETECTED Final   Coronavirus OC43 NOT DETECTED NOT DETECTED Final   Metapneumovirus NOT DETECTED NOT DETECTED Final   Rhinovirus / Enterovirus NOT DETECTED NOT DETECTED Final   Influenza A NOT DETECTED NOT DETECTED Final   Influenza B NOT DETECTED NOT DETECTED Final   Parainfluenza Virus 1 NOT DETECTED NOT DETECTED Final   Parainfluenza Virus 2 NOT DETECTED NOT DETECTED Final   Parainfluenza Virus 3 NOT DETECTED NOT DETECTED Final   Parainfluenza Virus 4 NOT DETECTED NOT DETECTED Final   Respiratory Syncytial Virus NOT DETECTED NOT DETECTED Final   Bordetella pertussis NOT DETECTED NOT DETECTED Final   Bordetella Parapertussis NOT DETECTED NOT DETECTED Final   Chlamydophila pneumoniae NOT DETECTED NOT DETECTED Final   Mycoplasma pneumoniae NOT DETECTED NOT DETECTED Final    Comment: Performed at [facility], [place] [address], [place], [place] [address]  Culture, blood (single) w Reflex to ID Panel     Status: None (Preliminary result)   Collection Time: 02/02/21  4:43 AM   Specimen: BLOOD  Result Value Ref Range Status   Specimen Description BLOOD  RIGHT AC  Final   Special Requests   Final    BOTTLES DRAWN AEROBIC AND ANAEROBIC Blood Culture adequate volume   Culture   Final    NO GROWTH 2 DAYS Performed at [facility], [address] [place], [place] [address]    Report Status PENDING  Incomplete  MRSA PCR Screening     Status: None   Collection Time: 02/03/21  9:56 AM   Specimen: Nasal Mucosa; Nasopharyngeal  Result Value Ref Range Status   MRSA by PCR NEGATIVE NEGATIVE Final    Comment:        The GeneXpert MRSA Assay (FDA approved for NASAL specimens only), is one component of a comprehensive MRSA colonization surveillance program. It is not intended to diagnose MRSA infection nor to guide or monitor  treatment for MRSA infections. Performed at [facility], [address] [place], [place] [address]      Labs: BNP (last 3 results) No results for input(s): BNP in the last 8760 hours. Basic Metabolic Panel: Recent Labs  Lab 01/31/21 1606 02/01/21 0342 02/02/21 0443 02/03/21 0323 02/04/21 0403  NA 135 139 142 143 142  K 3.5 4.3 4.3 4.3 5.0  CL 95* 98 102 104 100  CO2 _0 33*  GLUCOSE 161* 195* 163* 163* 172*  BUN 25* 26* 32* 34* 27*  CREATININE 1.25* 1.22* 1.25* 1.11* 1.03*  CALCIUM 8.6* 9.0 9.1 9.0 9.0   Liver Function Tests: Recent Labs  Lab 02/02/21 0443 02/03/21 0323 02/04/21 0403  AST 27 33 25  ALT _1 ALKPHOS 82 81 71  BILITOT 0.5 0.6 0.6  PROT 6.9 6.6 6.4*  ALBUMIN 2.9* 2.7* 2.7*   No results for input(s): LIPASE, AMYLASE in the last 168 hours. No results for input(s): AMMONIA in the last 168 hours. CBC: Recent Labs  Lab 01/31/21 1606 02/01/21 0342 02/02/21 0443 02/03/21 0323 02/04/21 0403  WBC 21.1* 14.2* 20.3* 21.6* 21.4*  NEUTROABS 18.2*  --  18.6* 19.9* 18.8*  HGB 11.2* 11.4* 10.9* 10.6* 10.9*  HCT 34.9* 34.7* 34.4* 33.3* 34.2*  MCV 92.6 93.3 94.8 93.5 93.7  PLT 284 265 293 299 297   Cardiac Enzymes: Recent Labs  Lab 02/04/21 0403  CKTOTAL 45   BNP: Invalid input(s): POCBNP CBG: No results for input(s): GLUCAP in the last 168 hours. D-Dimer Recent Labs    02/02/21 0822  DDIMER 1.70*   Hgb A1c No results for input(s): HGBA1C in the last 72 hours. Lipid Profile No results for input(s): CHOL, HDL, LDLCALC, TRIG, CHOLHDL, LDLDIRECT in the last 72 hours. Thyroid function studies No results for input(s): TSH, T4TOTAL, T3FREE, THYROIDAB in the last 72 hours.  Invalid input(s): FREET3 Anemia work up No results for input(s): VITAMINB12, FOLATE, FERRITIN, TIBC, IRON, RETICCTPCT in the last 72 hours. Urinalysis    Component Value Date/Time   COLORURINE YELLOW (A) 02/01/2021 0608   APPEARANCEUR HAZY (A)  02/01/2021 0608   APPEARANCEUR Cloudy (A) 10/07/2020 1016   LABSPEC 1.017 02/01/2021 0608   PHURINE 5.0 02/01/2021 0608   GLUCOSEU NEGATIVE 02/01/2021 0608   HGBUR NEGATIVE 02/01/2021 0608   BILIRUBINUR NEGATIVE 02/01/2021 0608   BILIRUBINUR Negative 10/07/2020 1016   KETONESUR NEGATIVE 02/01/2021 0608   PROTEINUR 30 (A) 02/01/2021 0608   UROBILINOGEN 0.2 01/07/2020 1406   UROBILINOGEN 0.2 11/25/2014 1849   NITRITE NEGATIVE 02/01/2021 0608   LEUKOCYTESUR NEGATIVE 02/01/2021 0608   Sepsis Labs Invalid input(s): PROCALCITONIN,  WBC,  LACTICIDVEN Microbiology Recent  Results (from the past 240 hour(s))  Resp Panel by RT-PCR (Flu A&B, Covid) Nasopharyngeal Swab     Status: None   Collection Time: 01/31/21  4:06 PM   Specimen: Nasopharyngeal Swab; Nasopharyngeal(NP) swabs in vial transport medium  Result Value Ref Range Status   SARS Coronavirus 2 by RT PCR NEGATIVE NEGATIVE Final    Comment: (NOTE) SARS-CoV-2 target nucleic acids are NOT DETECTED.  The SARS-CoV-2 RNA is generally detectable in upper respiratory specimens during the acute phase of infection. The lowest concentration of SARS-CoV-2 viral copies this assay can detect is 138 copies/mL. A negative result does not preclude SARS-Cov-2 infection and should not be used as the sole basis for treatment or other patient management decisions. A negative result may occur with  improper specimen collection/handling, submission of specimen other than nasopharyngeal swab, presence of viral mutation(s) within the areas targeted by this assay, and inadequate number of viral copies(<138 copies/mL). A negative result must be combined with clinical observations, patient history, and epidemiological information. The expected result is Negative.  Fact Sheet for Patients:  EntrepreneurPulse.com.au  Fact Sheet for Healthcare Providers:  IncredibleEmployment.be  This test is no t yet approved or  cleared by the [place] FDA and  has been authorized for detection and/or diagnosis of SARS-CoV-2 by FDA under an Emergency Use Authorization (EUA). This EUA will remain  in effect (meaning this test can be used) for the duration of the COVID-19 declaration under Section 564(b)(1) of the Act, 21 U.S.C.section 360bbb-3(b)(1), unless the authorization is terminated  or revoked sooner.       Influenza A by PCR NEGATIVE NEGATIVE Final   Influenza B by PCR NEGATIVE NEGATIVE Final    Comment: (NOTE) The Xpert Xpress SARS-CoV-2/FLU/RSV plus assay is intended as an aid in the diagnosis of influenza from Nasopharyngeal swab specimens and should not be used as a sole basis for treatment. Nasal washings and aspirates are unacceptable for Xpert Xpress SARS-CoV-2/FLU/RSV testing.  Fact Sheet for Patients: EntrepreneurPulse.com.au  Fact Sheet for Healthcare Providers: IncredibleEmployment.be  This test is not yet approved or cleared by the [place] FDA and has been authorized for detection and/or diagnosis of SARS-CoV-2 by FDA under an Emergency Use Authorization (EUA). This EUA will remain in effect (meaning this test can be used) for the duration of the COVID-19 declaration under Section 564(b)(1) of the Act, 21 U.S.C. section 360bbb-3(b)(1), unless the authorization is terminated or revoked.  Performed at [facility], [address] [place], [place] [address]   Blood culture (routine x 2)     Status: Abnormal   Collection Time: 01/31/21  5:53 PM   Specimen: BLOOD  Result Value Ref Range Status   Specimen Description   Final    BLOOD RIGHT ANTECUBITAL Performed at [facility], [address] [place], [place] [address]    Special Requests   Final    BOTTLES DRAWN AEROBIC AND ANAEROBIC Blood Culture adequate volume Performed at [facility], [address], [place], [place] [address]    Culture  Setup  Time   Final    GRAM POSITIVE COCCI IN BOTH AEROBIC AND ANAEROBIC BOTTLES CRITICAL VALUE NOTED.  VALUE IS CONSISTENT WITH PREVIOUSLY REPORTED AND CALLED VALUE. Performed at [facility], [address] [place], [place] [address]    Culture (A)  Final    STAPHYLOCOCCUS HOMINIS STAPHYLOCOCCUS EPIDERMIDIS SUSCEPTIBILITIES PERFORMED ON PREVIOUS CULTURE WITHIN THE LAST 5 DAYS. Performed at [facility], [place] [address],  [place], [place] [address]    Report Status 02/04/2021 FINAL  Final  Blood culture (routine x 2)     Status: Abnormal   Collection Time: 01/31/21  5:53 PM   Specimen: BLOOD  Result Value Ref Range Status   Specimen Description   Final    BLOOD BLOOD LEFT HAND Performed at [facility], [address], [place], [place] [address]    Special Requests   Final    BOTTLES DRAWN AEROBIC AND ANAEROBIC Blood Culture adequate volume Performed at [facility], [address], [place], [place] [address]    Culture  Setup Time   Final    GRAM POSITIVE COCCI IN BOTH AEROBIC AND ANAEROBIC BOTTLES CRITICAL RESULT CALLED TO, READ BACK BY AND VERIFIED WITH: Laqueta Carina AT 1243 02/01/21 SDR Performed at [facility], [place] [address], [place], [place] [address]    Culture (A)  Final    STAPHYLOCOCCUS EPIDERMIDIS STAPHYLOCOCCUS HOMINIS    Report Status 02/04/2021 FINAL  Final   Organism ID, Bacteria STAPHYLOCOCCUS EPIDERMIDIS  Final   Organism ID, Bacteria STAPHYLOCOCCUS HOMINIS  Final      Susceptibility   Staphylococcus epidermidis - MIC*    CIPROFLOXACIN >=8 RESISTANT Resistant     ERYTHROMYCIN >=8 RESISTANT Resistant     GENTAMICIN >=16 RESISTANT Resistant     OXACILLIN >=4 RESISTANT Resistant     TETRACYCLINE >=16 RESISTANT Resistant     VANCOMYCIN 1 SENSITIVE Sensitive     TRIMETH/SULFA 80 RESISTANT Resistant     CLINDAMYCIN >=8 RESISTANT Resistant     RIFAMPIN <=0.5 SENSITIVE Sensitive     Inducible Clindamycin NEGATIVE Sensitive      * STAPHYLOCOCCUS EPIDERMIDIS   Staphylococcus hominis - MIC*    CIPROFLOXACIN >=8 RESISTANT Resistant     ERYTHROMYCIN >=8 RESISTANT Resistant     GENTAMICIN <=0.5 SENSITIVE Sensitive     OXACILLIN >=4 RESISTANT Resistant     TETRACYCLINE <=1 SENSITIVE Sensitive     VANCOMYCIN 1 SENSITIVE Sensitive     TRIMETH/SULFA 80 RESISTANT Resistant     CLINDAMYCIN RESISTANT Resistant     RIFAMPIN <=0.5 SENSITIVE Sensitive     Inducible Clindamycin POSITIVE Resistant     * STAPHYLOCOCCUS HOMINIS  Blood Culture ID Panel (Reflexed)     Status: Abnormal   Collection Time: 01/31/21  5:53 PM  Result Value Ref Range Status   Enterococcus faecalis NOT DETECTED NOT DETECTED Final   Enterococcus Faecium NOT DETECTED NOT DETECTED Final   Listeria monocytogenes NOT DETECTED NOT DETECTED Final   Staphylococcus species DETECTED (A) NOT DETECTED Final    Comment: CRITICAL RESULT CALLED TO, READ BACK BY AND VERIFIED WITH:  SUSAN WATSON AT 1243 02/01/21 SDR    Staphylococcus aureus (BCID) NOT DETECTED NOT DETECTED Final   Staphylococcus epidermidis DETECTED (A) NOT DETECTED Final    Comment: Methicillin (oxacillin) resistant coagulase negative staphylococcus. Possible blood culture contaminant (unless isolated from more than one blood culture draw or clinical case suggests pathogenicity). No antibiotic treatment is indicated for blood  culture contaminants. CRITICAL RESULT CALLED TO, READ BACK BY AND VERIFIED WITH:  SUSAN WATSON AT 1243 02/01/21 SDR    Staphylococcus lugdunensis NOT DETECTED NOT DETECTED Final   Streptococcus species NOT DETECTED NOT DETECTED Final   Streptococcus agalactiae NOT DETECTED NOT DETECTED Final   Streptococcus pneumoniae NOT DETECTED NOT DETECTED Final   Streptococcus pyogenes NOT DETECTED NOT DETECTED Final   A.calcoaceticus-baumannii NOT DETECTED NOT DETECTED Final   Bacteroides fragilis NOT DETECTED  NOT DETECTED Final   Enterobacterales NOT DETECTED NOT DETECTED Final    Enterobacter cloacae complex NOT DETECTED NOT DETECTED Final   Escherichia coli NOT DETECTED NOT DETECTED Final   Klebsiella aerogenes NOT DETECTED NOT DETECTED Final   Klebsiella oxytoca NOT DETECTED NOT DETECTED Final   Klebsiella pneumoniae NOT DETECTED NOT DETECTED Final   Proteus species NOT DETECTED NOT DETECTED Final   Salmonella species NOT DETECTED NOT DETECTED Final   Serratia marcescens NOT DETECTED NOT DETECTED Final   Haemophilus influenzae NOT DETECTED NOT DETECTED Final   Neisseria meningitidis NOT DETECTED NOT DETECTED Final   Pseudomonas aeruginosa NOT DETECTED NOT DETECTED Final   Stenotrophomonas maltophilia NOT DETECTED NOT DETECTED Final   Candida albicans NOT DETECTED NOT DETECTED Final   Candida auris NOT DETECTED NOT DETECTED Final   Candida glabrata NOT DETECTED NOT DETECTED Final   Candida krusei NOT DETECTED NOT DETECTED Final   Candida parapsilosis NOT DETECTED NOT DETECTED Final   Candida tropicalis NOT DETECTED NOT DETECTED Final   Cryptococcus neoformans/gattii NOT DETECTED NOT DETECTED Final   Methicillin resistance mecA/C DETECTED (A) NOT DETECTED Final    Comment: CRITICAL RESULT CALLED TO, READ BACK BY AND VERIFIED WITH:  SUSAN WATSON AT 0263 02/01/21 SDR Performed at [facility], [place], [place] [address]   Respiratory (~20 pathogens) panel by PCR     Status: None   Collection Time: 02/01/21 12:44 PM   Specimen: Nasopharyngeal Swab; Respiratory  Result Value Ref Range Status   Adenovirus NOT DETECTED NOT DETECTED Final   Coronavirus 229E NOT DETECTED NOT DETECTED Final    Comment: (NOTE) The Coronavirus on the Respiratory Panel, DOES NOT test for the novel  Coronavirus (2019 nCoV)    Coronavirus HKU1 NOT DETECTED NOT DETECTED Final   Coronavirus NL63 NOT DETECTED NOT DETECTED Final   Coronavirus OC43 NOT DETECTED NOT DETECTED Final   Metapneumovirus NOT DETECTED NOT DETECTED Final   Rhinovirus / Enterovirus NOT  DETECTED NOT DETECTED Final   Influenza A NOT DETECTED NOT DETECTED Final   Influenza B NOT DETECTED NOT DETECTED Final   Parainfluenza Virus 1 NOT DETECTED NOT DETECTED Final   Parainfluenza Virus 2 NOT DETECTED NOT DETECTED Final   Parainfluenza Virus 3 NOT DETECTED NOT DETECTED Final   Parainfluenza Virus 4 NOT DETECTED NOT DETECTED Final   Respiratory Syncytial Virus NOT DETECTED NOT DETECTED Final   Bordetella pertussis NOT DETECTED NOT DETECTED Final   Bordetella Parapertussis NOT DETECTED NOT DETECTED Final   Chlamydophila pneumoniae NOT DETECTED NOT DETECTED Final   Mycoplasma pneumoniae NOT DETECTED NOT DETECTED Final    Comment: Performed at [facility], [address], [place], [place] [address]  Culture, blood (single) w Reflex to ID Panel     Status: None (Preliminary result)   Collection Time: 02/02/21  4:43 AM   Specimen: BLOOD  Result Value Ref Range Status   Specimen Description BLOOD RIGHT [facility]  Final   Special Requests   Final    BOTTLES DRAWN AEROBIC AND ANAEROBIC Blood Culture adequate volume   Culture   Final    NO GROWTH 2 DAYS Performed at [facility], [address], [place], [place] [address]    Report Status PENDING  Incomplete  MRSA PCR Screening     Status: None   Collection Time: 02/03/21  9:56 AM   Specimen: Nasal Mucosa; Nasopharyngeal  Result Value Ref Range Status   MRSA by PCR NEGATIVE NEGATIVE Final  Comment:        The GeneXpert MRSA Assay (FDA approved for NASAL specimens only), is one component of a comprehensive MRSA colonization surveillance program. It is not intended to diagnose MRSA infection nor to guide or monitor treatment for MRSA infections. Performed at [facility], [address] [place], [place] [address]      Time coordinating discharge: Over 30 minutes  SIGNED:   Phillips Climes, MD  [facility] 02/04/2021, 2:13 PM Pager   If 7PM-7AM, please contact  night-coverage [email or web address] Password [email or web address]

## 2021-02-04 NOTE — Progress Notes (Signed)
AVS reviewed with pt and her daughter. Pt and family verbalized understanding of all instructions. NAD noted prior to discharge and no concerns voiced.

## 2021-02-04 NOTE — Progress Notes (Signed)
Pulmonary Medicine          Date: 02/04/2021,   MRN# [id number] Stacie [name] 10/25/1955     AdmissionWeight: 80.1 kg                 CurrentWeight: 81.8 kg   Referring physician: Dr. Verlon Au   CHIEF COMPLAINT:   Pulmonary sarcoidosis and pulmonary hypertension with high risk immunosuppressive therapy in context of sepsis bacteremia   HISTORY OF PRESENT ILLNESS   This a pleasant 65 year old female with history of chronic respiratory failure, pulmonary sarcoidosis, ILD related pulmonary hypertension with chronic hypoxemia on 2 to 3 L of oxygen at home, previous admission in critical care unit with bacteremia 1 year ago required intubation at that time, also sustained acute CVA during that admission with good residual recovery and subsequent DC home.  Besides that her comorbid history is extensive including stage III CKD, hypothyroidism, nephrolithiasis, peripheral vascular disease requiring multiple surgeries, OSA on CPAP, she came in with worsening respiratory failure symptoms of possible lower respiratory tract infection worsening cough.  Also had presyncopal episodes.  Subjective fevers are reported while at home.  She was found to be afebrile on admission however had significant leukocytosis with hypoxemia in the low 80s on room air.  She had blood cultures done which did show 2 positive for MRSE.  She had pulmonary infiltrates on her chest x-ray which was done on admission.  Pulmonary consultation placed due to patient currently being on significant high risk therapy while infected including 25 mg of methotrexate once weekly as well as twice daily steroids.   02/03/21- s/p HRCT - significant vericose and sacular bronchiectasis worse at lingular and RML segments likely as a result of fibrotic sarcoidosis. There is honeycombing changes consistent with fibrosis at bases worse at right but overall mild to moderate severity,no pleural effusion, chronic bronchitic changes  throughout , importantly there is absence of consolidated infiltrate to suggest ongoing active pneumonia but rather slow indolent process mostly due to sarcoid and bronchiectasis, there is no mass or exophytic lesion.   02/04/21-patient evaluated at bedside.  No overnight events.  Blood work reviewed from this morning with interval improvement in renal function, leukocytosis has improved overnight.  She is on 2-3L/min O2 supplemental Oxygen. She is able to move around freely without dyspnea on current setting. WBC count istrending down appropriately on antibiotics.  MRSA PCR is negative.  Albumin is decreasing which may cause third spacing of fluid and pulmonary edema and is likely due to poor nourishment recently while acutely ill, will need to improve nutrition and PT to optimize for DC.  Procalcitonin trend with significant improvement and 0.37 this morning.  Inflammatory biomarkers are profoundly elevated again suggestive of more acute exacerbation of RA-ILD, angiotensin-converting enzyme is within reference range a biomarker loosly associated with active sarcoid.  Will obtain additional input regarding steroid sparing therapy and lieu of failure of Plaquenil, CellCept, Imuran, and elevated methotrexate dosage, rheumatology consultation has been ordered routine.  Met with husband briefly yesterday to review short-term plan and post hospital follow up.  Patient using CPAP overnight without problems.             Procalcitonin ng/mL 0.37  0.71 CM  2.00 CM  76.60 CM  >150.00 CM  >150.00 CM   Comment:         PAST MEDICAL HISTORY   Past Medical History:  Diagnosis Date  . Acute respiratory failure with hypoxia ([place])   . Arrhythmia  patient unaware if this is current  . Asthma   . Chronic kidney disease   . Depression   . GERD (gastroesophageal reflux disease)   . Heart murmur   . History of kidney stones   . HOH (hard of hearing)    wear aids  . Hyperthyroidism   . Hypothyroidism   .  IBS (irritable bowel syndrome)   . Pneumonia   . Pulmonary hypertension ([place])   . Sarcoid   . Sarcoidosis   . Seasonal allergies   . Sleep apnea CPAP with O2  . Stroke ([place]) 07/2020   [facility]  . Wears hearing aid in both ears      SURGICAL HISTORY   Past Surgical History:  Procedure Laterality Date  . ABDOMINAL HYSTERECTOMY     partial  . AMPUTATION Left 01/14/2021   Procedure: AMPUTATION RAY (1ST TOE ) ( 2ND METATARSAL HEAD RESECTION);  Surgeon: Algernon Huxley, MD;  Location: [facility];  Service: Vascular;  Laterality: Left;  . CARDIAC CATHETERIZATION  10/18/2018   [facility]  . CATARACT EXTRACTION W/PHACO Left 07/31/2019   Procedure: CATARACT EXTRACTION PHACO AND INTRAOCULAR LENS PLACEMENT (IOC) LEFT 00:51.1  17.9%  9.15;  Surgeon: Leandrew Koyanagi, MD;  Location: [facility];  Service: Ophthalmology;  Laterality: Left;  keep this patient second  . COLON SURGERY     "colon was fused to bladder - operated on both"  . COLONOSCOPY  09/18/2007   diverticuli, no polyps  . COLONOSCOPY  05/26/2010   diverticuli, no polyps  . CYSTOSCOPY WITH STENT PLACEMENT Bilateral 07/14/2020   Procedure: CYSTOSCOPY WITH STENT PLACEMENT, RETROPYLOGRAM;  Surgeon: Billey Co, MD;  Location: [facility];  Service: Urology;  Laterality: Bilateral;  . CYSTOSCOPY/URETEROSCOPY/HOLMIUM LASER/STENT PLACEMENT Left 02/20/2020   Procedure: CYSTOSCOPY/URETEROSCOPY/LITHOTRIPSY /STENT PLACEMENT;  Surgeon: Hollice Espy, MD;  Location: [facility];  Service: Urology;  Laterality: Left;  . CYSTOSCOPY/URETEROSCOPY/HOLMIUM LASER/STENT PLACEMENT Bilateral 10/02/2020   Procedure: CYSTOSCOPY/URETEROSCOPY/HOLMIUM LASER/STENT PLACEMENT;  Surgeon: Billey Co, MD;  Location: [facility];  Service: Urology;  Laterality: Bilateral;  . EYE SURGERY    . PARS PLANA VITRECTOMY Right 05/20/2015   Procedure: PARS PLANA VITRECTOMY WITH 25 GAUGE, laser;  Surgeon: Milus Height, MD;  Location: [facility];  Service: Ophthalmology;   Laterality: Right;  . PARTIAL HYSTERECTOMY  1990  . TEE WITHOUT CARDIOVERSION N/A 07/27/2020   Procedure: TRANSESOPHAGEAL ECHOCARDIOGRAM (TEE);  Surgeon: Teodoro Spray, MD;  Location: [facility];  Service: Cardiovascular;  Laterality: N/A;  . TUBAL LIGATION    . WISDOM TOOTH EXTRACTION       FAMILY HISTORY   Family History  Problem Relation Age of Onset  . Allergies Father   . Asthma Father   . Colon cancer Father   . Allergies Brother   . Asthma Brother   . Breast cancer Maternal Grandmother      SOCIAL HISTORY   Social History   Tobacco Use  . Smoking status: Never Smoker  . Smokeless tobacco: Never Used  Vaping Use  . Vaping Use: Never used  Substance Use Topics  . Alcohol use: No  . Drug use: No     MEDICATIONS    Home Medication:    Current Medication:  Current Facility-Administered Medications:  .  acetaminophen (TYLENOL) tablet 650 mg, 650 mg, Oral, Q6H PRN, 650 mg at 02/02/21 1152 **OR** acetaminophen (TYLENOL) suppository 650 mg, 650 mg, Rectal, Q6H PRN, Posey Pronto, Vishal R, MD .  albuterol (PROVENTIL) (2.5 MG/3ML) 0.083% nebulizer solution 2.5 mg, 2.5  mg, Inhalation, Q6H PRN, Zada Finders R, MD, 2.5 mg at 02/02/21 1050 .  aspirin EC tablet 81 mg, 81 mg, Oral, Daily, Zada Finders R, MD, 81 mg at 02/04/21 0803 .  atorvastatin (LIPITOR) tablet 40 mg, 40 mg, Oral, Daily, Zada Finders R, MD, 40 mg at 02/04/21 0803 .  azithromycin (ZITHROMAX) 500 mg in sodium chloride 0.9 % 250 mL IVPB, 500 mg, Intravenous, Q24H, Leonel Ramsay, MD, Last Rate: 250 mL/hr at 02/03/21 1851, 500 mg at 02/03/21 1851 .  cefTRIAXone (ROCEPHIN) 2 g in sodium chloride 0.9 % 100 mL IVPB, 2 g, Intravenous, Q24H, Leonel Ramsay, MD, Last Rate: 200 mL/hr at 02/03/21 1647, 2 g at 02/03/21 1647 .  Chlorhexidine Gluconate Cloth 2 % PADS 6 each, 6 each, Topical, Daily, Elgergawy, Dawood S, MD .  DAPTOmycin (CUBICIN) 700 mg in sodium chloride 0.9 % IVPB, 700 mg, Intravenous, Q2000,  Berton Mount, RPH .  dextromethorphan (DELSYM) 30 MG/5ML liquid 15 mg, 15 mg, Oral, BID, Verlon Au, Jai-Gurmukh, MD, 15 mg at 02/04/21 0802 .  diclofenac Sodium (VOLTAREN) 1 % topical gel 2 g, 2 g, Topical, QID, Acheampong, Warnell Bureau, MD, 2 g at 02/03/21 2040 .  enoxaparin (LOVENOX) injection 40 mg, 40 mg, Subcutaneous, Q24H, Zada Finders R, MD, 40 mg at 02/03/21 2039 .  escitalopram (LEXAPRO) tablet 20 mg, 20 mg, Oral, Daily, Zada Finders R, MD, 20 mg at 02/04/21 0806 .  folic acid (FOLVITE) tablet 1 mg, 1 mg, Oral, Daily, Samtani, Jai-Gurmukh, MD, 1 mg at 02/04/21 0803 .  HYDROcodone-homatropine (HYCODAN) 5-1.5 MG/5ML syrup 5 mL, 5 mL, Oral, Q4H PRN, Nita Sells, MD, 5 mL at 02/03/21 2046 .  ipratropium-albuterol (DUONEB) 0.5-2.5 (3) MG/3ML nebulizer solution 3 mL, 3 mL, Nebulization, TID, Zada Finders R, MD, 3 mL at 02/04/21 0758 .  levothyroxine (SYNTHROID) tablet 137 mcg, 137 mcg, Oral, QAC breakfast, Lenore Cordia, MD, 137 mcg at 02/04/21 0556 .  methylPREDNISolone sodium succinate (SOLU-MEDROL) 40 mg/mL injection 40 mg, 40 mg, Intravenous, Daily, Elgergawy, Silver Huguenin, MD, 40 mg at 02/04/21 0801 .  mometasone-formoterol (DULERA) 100-5 MCG/ACT inhaler 2 puff, 2 puff, Inhalation, BID, Lenore Cordia, MD, 2 puff at 02/04/21 [phone] .  ondansetron (ZOFRAN) tablet 4 mg, 4 mg, Oral, Q6H PRN **OR** ondansetron (ZOFRAN) injection 4 mg, 4 mg, Intravenous, Q6H PRN, Patel, Vishal R, MD .  polyethylene glycol (MIRALAX / GLYCOLAX) packet 17 g, 17 g, Oral, Daily, Verlon Au, Jai-Gurmukh, MD, 17 g at 02/03/21 0829 .  senna-docusate (Senokot-S) tablet 1 tablet, 1 tablet, Oral, QHS PRN, Zada Finders R, MD .  senna-docusate (Senokot-S) tablet 1 tablet, 1 tablet, Oral, QHS, Nita Sells, MD, 1 tablet at 02/02/21 2009 .  sodium chloride flush (NS) 0.9 % injection 10-40 mL, 10-40 mL, Intracatheter, Q12H, Elgergawy, Dawood S, MD .  sodium chloride flush (NS) 0.9 % injection 10-40 mL, 10-40 mL,  Intracatheter, PRN, Elgergawy, Silver Huguenin, MD .  Treprostinil (TYVASO) inhalation solution 0.6 mcg, 0.6 mcg, Inhalation, See admin instructions, Lenore Cordia, MD    ALLERGIES   Nitrofurantoin, Plaquenil [hydroxychloroquine], Tramadol, and Sulfa antibiotics     REVIEW OF SYSTEMS    Review of Systems:  Gen:  Denies  fever, sweats, chills weigh loss  HEENT: Denies blurred vision, double vision, ear pain, eye pain, hearing loss, nose bleeds, sore throat Cardiac:  No dizziness, chest pain or heaviness, chest tightness,edema Resp:   Denies cough or sputum porduction, shortness of breath,wheezing, hemoptysis,  Gi: Denies swallowing difficulty, stomach  pain, nausea or vomiting, diarrhea, constipation, bowel incontinence Gu:  Denies bladder incontinence, burning urine Ext:   Denies Joint pain, stiffness or swelling Skin: Denies  skin rash, easy bruising or bleeding or hives Endoc:  Denies polyuria, polydipsia , polyphagia or weight change Psych:   Denies depression, insomnia or hallucinations   Other:  All other systems negative   VS: BP 135/76 (BP Location: Left Arm)   Pulse 96   Temp (!) 97.5 F (36.4 C) (Oral)   Resp 18   Ht _0  (1.702 m)   Wt 81.8 kg   SpO2 97%   BMI 28.24 kg/m      PHYSICAL EXAM    GENERAL:NAD, no fevers, chills, no weakness no fatigue HEAD: Normocephalic, atraumatic.  EYES: Pupils equal, round, reactive to light. Extraocular muscles intact. No scleral icterus.  MOUTH: Moist mucosal membrane. Dentition intact. No abscess noted.  EAR, NOSE, THROAT: Clear without exudates. No external lesions.  NECK: Supple. No thyromegaly. No nodules. No JVD.  PULMONARY: Diffuse coarse rhonchi right sided +wheezes CARDIOVASCULAR: S1 and S2. Regular rate and rhythm. No murmurs, rubs, or gallops. No edema. Pedal pulses 2+ bilaterally.  GASTROINTESTINAL: Soft, nontender, nondistended. No masses. Positive bowel sounds. No hepatosplenomegaly.  MUSCULOSKELETAL: No  swelling, clubbing, or edema. Range of motion full in all extremities.  NEUROLOGIC: Cranial nerves II through XII are intact. No gross focal neurological deficits. Sensation intact. Reflexes intact.  SKIN: No ulceration, lesions, rashes, or cyanosis. Skin warm and dry. Turgor intact.  PSYCHIATRIC: Mood, affect within normal limits. The patient is awake, alert and oriented x 3. Insight, judgment intact.       IMAGING    DG Chest 2 View  Result Date: 02/02/2021 CLINICAL DATA:  Pulmonary sarcoid.  Pneumonia. EXAM: CHEST - 2 VIEW COMPARISON:  01/31/2021 FINDINGS: Chronic interstitial coarsening with hilar architectural distortion and apical volume loss. Fibrotic changes are worse at the left apex. As noted previously there may be superimposed pneumonia. Normal heart size. Stable mediastinal contours. Vertebroplasty of midthoracic levels. IMPRESSION: Sarcoid associated pulmonary fibrosis and history of pneumonia. No progression since 2 days ago. Electronically Signed   By: Monte Fantasia M.D.   On: 02/02/2021 08:14   CT Chest High Resolution  Result Date: 02/03/2021 CLINICAL DATA:  Cough with increased shortness of breath for 3 weeks. Sarcoid. EXAM: CT CHEST WITHOUT CONTRAST TECHNIQUE: Multidetector CT imaging of the chest was performed following the standard protocol without intravenous contrast. High resolution imaging of the lungs, as well as inspiratory and expiratory imaging, was performed. COMPARISON:  07/10/2020 and 02/20/2015. FINDINGS: Cardiovascular: Atherosclerotic calcification of the aorta, aortic valve and coronary arteries. Pulmonic trunk is enlarged. Heart is at the upper limits of normal in size. No pericardial effusion. Mediastinum/Nodes: No pathologically enlarged mediastinal lymph nodes. Hilar regions are difficult to evaluate without IV contrast but appear enlarged, as before. No axillary adenopathy. Esophagus is grossly unremarkable. Lungs/Pleura: Extensive upper and midlung zone  predominant traction bronchiectasis, architectural distortion, interstitial coarsening, peribronchovascular nodularity and ground-glass, slightly progressive from 07/10/2020 in the lung bases. Associated subpleural cysts in the lung bases. No pleural fluid. Airway is otherwise unremarkable. Upper Abdomen: Visualized portions of the liver, gallbladder, adrenal glands, kidneys, spleen, pancreas, stomach and bowel are grossly unremarkable with the exceptions of a small hiatal hernia and large duodenal diverticulum, incompletely imaged. Musculoskeletal: Degenerative changes in the spine. Vertebral body augmentations. T6 and T12 compression fractures are unchanged. There may be a new compression deformity of the T2 superior endplate. IMPRESSION:  1. Pulmonary parenchymal findings of end-stage sarcoid. Increased peribronchovascular nodularity and ground-glass in the lung bases may be due to disease progression or superimposed infectious bronchiolitis/bronchopneumonia. 2. New T2 superior endplate compression fracture. 3. Aortic atherosclerosis (ICD10-I70.0). Coronary artery calcification. 4. Enlarged pulmonic trunk, indicative of pulmonary arterial hypertension. Electronically Signed   By: Lorin Picket M.D.   On: 02/03/2021 11:07   CT FOOT LEFT W CONTRAST  Result Date: 02/01/2021 CLINICAL DATA:  Left foot infection. Recent amputation three weeks ago. EXAM: CT OF THE LOWER LEFT EXTREMITY WITH CONTRAST TECHNIQUE: Multidetector CT imaging of the lower left extremity was performed according to the standard protocol following intravenous contrast administration. CONTRAST:  42m OMNIPAQUE IOHEXOL 300 MG/ML  SOLN COMPARISON:  None. FINDINGS: Bones/Joint/Cartilage Prior great toe amputation to the level of the first metatarsal head. The surgical margin is not sharp and there is continued cortical irregularity. Prior second toe amputation to the level of the second metatarsal neck. The surgical margin appears sharp, however  there is a small acute minimally displaced oblique fracture of the second metatarsal neck. Prior third toe amputation. No dislocation. Joint spaces are preserved. No joint effusion. Ligaments Ligaments are suboptimally evaluated by CT. Muscles and Tendons Grossly intact. Soft tissue Soft tissue thickening at the first and second toe amputation sites. No discrete fluid collection. No subcutaneous emphysema. No soft tissue mass. IMPRESSION: 1. Prior great toe amputation to the level of the first metatarsal head. The surgical margin is not sharp and there is continued cortical irregularity, concerning for osteomyelitis. No discrete abscess. 2. Prior second toe amputation to the level of the second metatarsal neck. The surgical margin appears sharp, however there is a small acute minimally displaced oblique fracture of the second metatarsal neck. Electronically Signed   By: WTitus DubinM.D.   On: 02/01/2021 16:11   MR FOOT LEFT W WO CONTRAST  Result Date: 02/03/2021 CLINICAL DATA:  Patient status post amputation of the left great toe and second metatarsal head 01/14/2021. Prior history of third toe amputation. Continued left foot infection. EXAM: MRI OF THE LEFT FOREFOOT WITHOUT AND WITH CONTRAST TECHNIQUE: Multiplanar, multisequence MR imaging of the left forefoot was performed both before and after administration of intravenous contrast. CONTRAST:  8 mL GADAVIST IV SOLN COMPARISON:  CT left foot 02/01/2021. FINDINGS: Bones/Joint/Cartilage Postoperative change of amputation at the level of the head of the first metatarsal again seen. There is edema and enhancement in the distal 2.4 cm of the first metatarsal which is most intense in the distal 1.1 cm of the first metatarsal. Intense edema and enhancement are also seen in the medial and lateral sesamoids of the first MTP joint. The patient is status post amputation at the level of the neck of the second metatarsal. There is minimal edema and enhancement at the  surgical site most consistent with postoperative change. The third toe has been amputated. There is intense edema and enhancement in the head and neck of the third metatarsal extending approximately 1.6 cm in length. Bone marrow signal is otherwise unremarkable. No joint effusion. Ligaments Negative. Muscles and Tendons No intramuscular fluid collection inflammatory change. Soft tissues Negative for abscess. Skin wound at the stump of the first metatarsal head noted. IMPRESSION: Intense edema in the distal 1.1 cm of the first metatarsal, medial and lateral sesamoids of the first MTP joint and distal 1.6 cm of the third metatarsal consistent with osteomyelitis. Milder degree of edema extending approximately 1.3 cm in the distal diaphysis of the first metatarsal  could be due to early osteomyelitis or may reflect reactive change. Negative for septic joint or myositis. Electronically Signed   By: Inge Rise M.D.   On: 02/03/2021 19:17   DG Chest Port 1 View  Result Date: 01/31/2021 CLINICAL DATA:  Dyspnea, syncope EXAM: PORTABLE CHEST 1 VIEW COMPARISON:  07/20/2020, CT 07/10/2020 FINDINGS: There is progressive biapical pulmonary infiltrate, more severe within the left upper lobe as well as coarse infiltrate at the right lung base. This may represent interval progression of disease involving the patient's known underlying sarcoidosis or reflect superimposed acute inflammatory changes related to atypical infection upon underlying interstitial lung disease. Stable left basilar pleural thickening. No pneumothorax or pleural effusion. Cardiac size is within normal limits. Hilar enlargement is noted and relates to central pulmonary arterial enlargement better seen on prior CT examination. Multilevel vertebroplasty again noted. IMPRESSION: Progressive asymmetric apically predominant pulmonary infiltrates, more severe within the left apex. This represents either progression of the patient's underlying sarcoidosis or  acute on chronic disease related to atypical infection in the acute setting. Stable hilar enlargement in keeping with pulmonary arterial hypertension. Electronically Signed   By: Fidela Salisbury MD   On: 01/31/2021 17:27   ECHOCARDIOGRAM COMPLETE  Result Date: 02/02/2021    ECHOCARDIOGRAM REPORT   Patient Name:   Dawna Part Date of Exam: 02/02/2021 Medical Rec #:  [id number]         Height:       67.0 in Accession #:    [id number]        Weight:       169.8 lb Date of Birth:  1956-06-30          BSA:          1.886 m Patient Age:    38 years          BP:           122/79 mmHg Patient Gender: F                 HR:           97 bpm. Exam Location:  [facility] Procedure: 2D Echo, Cardiac Doppler, Color Doppler and Strain Analysis Indications:     Bacteremia R78.81  History:         Patient has prior history of Echocardiogram examinations, most                  recent 07/27/2020. Pulmonary HTN and Stroke;                  Signs/Symptoms:Murmur.  Sonographer:     Sherrie Sport RDCS (AE) Referring Phys:  [place] Diagnosing Phys: Nelva Bush MD  Sonographer Comments: Global longitudinal strain was attempted. IMPRESSIONS  1. Left ventricular ejection fraction, by estimation, is 60 to 65%. The left ventricle has normal function. Left ventricular endocardial border not optimally defined to evaluate regional wall motion. Left ventricular diastolic parameters are consistent with Grade I diastolic dysfunction (impaired relaxation). Elevated left atrial pressure. The average left ventricular global longitudinal strain is -15.4 %. The global longitudinal strain is abnormal.  2. Right ventricular systolic function is normal. The right ventricular size is normal. There is severely elevated pulmonary artery systolic pressure.  3. The mitral valve is normal in structure. Trivial mitral valve regurgitation. No evidence of mitral stenosis.  4. Tricuspid valve regurgitation is moderate.  5. The aortic valve is  tricuspid. There is mild thickening of the aortic valve. Aortic  valve regurgitation is not visualized. Mild aortic valve sclerosis is present, with no evidence of aortic valve stenosis.  6. Aortic dilatation noted. There is mild dilatation of the ascending aorta, measuring 39 mm.  7. The inferior vena cava is normal in size with <50% respiratory variability, suggesting right atrial pressure of 8 mmHg. Conclusion(s)/Recommendation(s): No obvious vegetation identified, though native valvular disease is present. Consider transesophageal echocardiogram if clinical concern for endocarditis persists. FINDINGS  Left Ventricle: Left ventricular ejection fraction, by estimation, is 60 to 65%. The left ventricle has normal function. Left ventricular endocardial border not optimally defined to evaluate regional wall motion. The average left ventricular global longitudinal strain is -15.4 %. The global longitudinal strain is abnormal. The left ventricular internal cavity size was normal in size. There is borderline left ventricular hypertrophy. Left ventricular diastolic parameters are consistent with Grade I diastolic dysfunction (impaired relaxation). Elevated left atrial pressure. Right Ventricle: The right ventricular size is normal. No increase in right ventricular wall thickness. Right ventricular systolic function is normal. There is severely elevated pulmonary artery systolic pressure. The tricuspid regurgitant velocity is 3.91 m/s, and with an assumed right atrial pressure of 8 mmHg, the estimated right ventricular systolic pressure is 41.9 mmHg. Left Atrium: Left atrial size was normal in size. Right Atrium: Right atrial size was normal in size. Pericardium: The pericardium was not well visualized. Presence of pericardial fat pad. Mitral Valve: The mitral valve is normal in structure. There is mild thickening of the mitral valve leaflet(s). Mild mitral annular calcification. Trivial mitral valve regurgitation. No  evidence of mitral valve stenosis. Tricuspid Valve: The tricuspid valve is normal in structure. Tricuspid valve regurgitation is moderate. Aortic Valve: The aortic valve is tricuspid. There is mild thickening of the aortic valve. Aortic valve regurgitation is not visualized. Mild aortic valve sclerosis is present, with no evidence of aortic valve stenosis. Aortic valve mean gradient measures 8.3 mmHg. Aortic valve peak gradient measures 15.6 mmHg. Aortic valve area, by VTI measures 1.95 cm. Pulmonic Valve: The pulmonic valve was normal in structure. Pulmonic valve regurgitation is trivial. No evidence of pulmonic stenosis. Aorta: The aortic root is normal in size and structure and aortic dilatation noted. There is mild dilatation of the ascending aorta, measuring 39 mm. Pulmonary Artery: The pulmonary artery is not well seen. Venous: The inferior vena cava is normal in size with less than 50% respiratory variability, suggesting right atrial pressure of 8 mmHg. IAS/Shunts: The interatrial septum was not well visualized.  LEFT VENTRICLE PLAX 2D LVIDd:         3.26 cm  Diastology LVIDs:         2.15 cm  LV e' medial:    5.55 cm/s LV PW:         1.10 cm  LV E/e' medial:  16.1 LV IVS:        0.80 cm  LV e' lateral:   6.20 cm/s LVOT diam:     2.00 cm  LV E/e' lateral: 14.4 LV SV:         68 LV SV Index:   36       2D Longitudinal Strain LVOT Area:     3.14 cm 2D Strain GLS Avg:     -15.4 %                          3D Volume EF:  3D EF:        59 %                         LV EDV:       103 ml                         LV ESV:       43 ml                         LV SV:        60 ml RIGHT VENTRICLE RV Basal diam:  3.23 cm RV S prime:     13.30 cm/s TAPSE (M-mode): 3.0 cm LEFT ATRIUM             Index       RIGHT ATRIUM           Index LA diam:        3.50 cm 1.86 cm/m  RA Area:     14.00 cm LA Vol (A2C):   24.3 ml 12.88 ml/m RA Volume:   34.80 ml  18.45 ml/m LA Vol (A4C):   24.8 ml 13.15 ml/m LA  Biplane Vol: 25.9 ml 13.73 ml/m  AORTIC VALVE                    PULMONIC VALVE AV Area (Vmax):    1.75 cm     PV Vmax:        0.88 m/s AV Area (Vmean):   1.92 cm     PV Peak grad:   3.1 mmHg AV Area (VTI):     1.95 cm     RVOT Peak grad: 4 mmHg AV Vmax:           197.67 cm/s AV Vmean:          134.000 cm/s AV VTI:            0.348 m AV Peak Grad:      15.6 mmHg AV Mean Grad:      8.3 mmHg LVOT Vmax:         110.00 cm/s LVOT Vmean:        81.900 cm/s LVOT VTI:          0.216 m LVOT/AV VTI ratio: 0.62  AORTA Ao Root diam: 3.53 cm MITRAL VALVE                TRICUSPID VALVE MV Area (PHT): 3.45 cm     TR Peak grad:   61.2 mmHg MV Decel Time: 220 msec     TR Vmax:        391.00 cm/s MV E velocity: 89.10 cm/s MV A velocity: 120.00 cm/s  SHUNTS MV E/A ratio:  0.74         Systemic VTI:  0.22 m                             Systemic Diam: 2.00 cm Nelva Bush MD Electronically signed by Nelva Bush MD Signature Date/Time: 02/02/2021/12:58:19 PM    Final    [place] EKG SITE RITE  Result Date: 02/04/2021 If Site Rite image not attached, placement could not be confirmed due to current cardiac rhythm.        ASSESSMENT/PLAN   Acute on chronic hypoxemic respiratory failure - present on admission -  in context of sepsis with MRSE bacteremia x 2 - Infectious disease on case - appreciate input -patient s/p surgical pathology of left foot - acute osteomyelitis + - vascular surgery on case - appreciate input - cxr with worsening interval changes as evidenced above - suggesting ILD sarcoidosis exacerbation - while in sepsis bacteremia may dc methotrexate and use solumedrol only, will need CT high resolution to review parenchyma for better differentiation of infection vs ILD  - COVID19 -negative  - supplemental O2 during my evaluation 2L/min - s/p TTE - report pending  -Respiratory viral panel-negative -serum fungitell -legionella ab -strep pneumoniae ur AG -Histoplasma Ur Ag -sputum resp cultures -AFB  sputum expectorated specimen -sputum cytology  -reviewed pertinent imaging with patient today - ESR/RF/Ddimer/CRP to evaluate inflammatory biomarkers -ACE level due to underlying sarcoidosis with possible exacerbation  -Agree with solumedrol 40 bid -DC MTX 25 once weekly while in sepsis with bacteremia  -patient on IVF 75cc/hr, she is euvolemic on examination and I will dc IV fluids due to potential for edema and worsening hypoxemia. -PT/OT for d/c planning  -please encourage patient to use incentive spirometer few times each hour while hospitalized.     ILD associated Pulmonary Hypertension  -Patient is currently on [place] and is well familiar with device - she has follow up with pulmonary on outpatient set up   -Patient may benefit from pulmonary hypertension work-up outpatient- most recent NT proBNP was 340 -Transthoracic echo has been performed and report is pending -Continue with  PRN diuresis and supplemental oxygen at this time  -Cardiology on case-appreciate input   Pulmonary sarcoidosis -Stage IV CXR with bibasilar fibrosis -Currently on MTX 25 once weekly-we will hold while in acute sepsis with bacteremia -Continue Solu-Medrol 40 twice daily with transition to p.o. prednisone with slow taper -will obtain CT chest noncontrasted to evaluate lung parenchyma and fibrotic burden -will need outpatient follow-up with full PFTs -Evaluation for BiPAP/noninvasive ventilator rather than CPAP due to restrictive pulmonary physiology with fibrotic changes on CXR this admission.    Obstructive sleep apnea -Continue home CPAP with 3 to 4 L oxygen bleed in with humidification -May change settings as per RT while acutely ill   Thank you for allowing me to participate in the care of this patient.   Patient/Family are satisfied with care plan and all questions have been answered.  This document was prepared using Dragon voice recognition software and may include unintentional dictation  errors.     Ottie Glazier, M.D.  Division of [place]

## 2021-02-04 NOTE — Discharge Instructions (Signed)
Follow with Primary MD Glean Hess, MD in 7 days   Get CBC, CMP,  checked  by Primary MD next visit.    Activity: As tolerated with Full fall precautions use walker/cane & assistance as needed   Disposition Home    Diet: Heart Healthy .   On your next visit with your primary care physician please Get Medicines reviewed and adjusted.   Please request your Prim.MD to go over all Hospital Tests and Procedure/Radiological results at the follow up, please get all Hospital records sent to your Prim MD by signing hospital release before you go home.   If you experience worsening of your admission symptoms, develop shortness of breath, life threatening emergency, suicidal or homicidal thoughts you must seek medical attention immediately by calling 911 or calling your MD immediately  if symptoms less severe.  You Must read complete instructions/literature along with all the possible adverse reactions/side effects for all the Medicines you take and that have been prescribed to you. Take any new Medicines after you have completely understood and accpet all the possible adverse reactions/side effects.   Do not drive when taking Pain medications.    Do not take more than prescribed Pain, Sleep and Anxiety Medications  Special Instructions: If you have smoked or chewed Tobacco  in the last 2 yrs please stop smoking, stop any regular Alcohol  and or any Recreational drug use.  Wear Seat belts while driving.   Please note  You were cared for by a hospitalist during your hospital stay. If you have any questions about your discharge medications or the care you received while you were in the hospital after you are discharged, you can call the unit and asked to speak with the hospitalist on call if the hospitalist that took care of you is not available. Once you are discharged, your primary care physician will handle any further medical issues. Please note that NO REFILLS for any discharge  medications will be authorized once you are discharged, as it is imperative that you return to your primary care physician (or establish a relationship with a primary care physician if you do not have one) for your aftercare needs so that they can reassess your need for medications and monitor your lab values.

## 2021-02-04 NOTE — TOC Transition Note (Signed)
Transition of Care [facility]) - CM/SW Discharge Note   Patient Details  Name: Stacie [name] MRN: [id number] Date of Birth: 1956/03/14  [facility] CM/SW Contact:  Candie Chroman, LCSW Phone Number: 02/04/2021, 2:22 PM   Clinical Narrative:  Patient has orders to discharge home today. Pam with [facility] will be here around 3:30 for teaching. She has arranged a nurse through [employer] and they will go to the home tomorrow. Left [facility] representative a message to let her know discharge orders are in. No further concerns. CSW signing off.   Final next level of care: [place] Barriers to Discharge: Barriers Resolved   Patient Goals and CMS Choice     Choice offered to / list presented to : Patient  Discharge Placement                    Patient and family notified of of transfer: 02/04/21  Discharge Plan and Services   Discharge Planning Services: CM Consult Post Acute Care Choice: [place]: PT,IV Antibiotics,RN [place] Agency: [facility] ([facility], [facility] (RN)) Date HH Agency Contacted: 02/04/21   Representative spoke with at [place]: Jackquline [place]: Jana Half, [facility]: Carolynn Sayers  Social Determinants of Health (SDOH) Interventions     Readmission Risk Interventions Readmission Risk Prevention Plan 07/17/2020  Transportation Screening Complete  PCP or Specialist Appt within 3-5 Days Complete  HRI or Home Care Consult Complete  Palliative Care Screening Complete  Medication Review (RN Care Manager) Complete  Some recent data might be hidden

## 2021-02-04 NOTE — Progress Notes (Signed)
Pharmacy Antibiotic Note  Stacie [name] is a 65 y.o. female admitted on 01/31/2021 with methicillin-resistant staphylococcus epidermidis bacteremia. Patient presents with chief complaint of worsening shortness of breath. She started developing cold-like symptoms about 1 week ago, including fevers, congestion, and cough occasionally productive of clear sputum. She has a past medical history of pulmonary sarcoidosis and pulmonary hypertension. Chest x-ray shows progressive asymmetric pulmonary infiltrates, more severe within the left apex, which represents either progression of sarcoidosis or acute on chronic disease related to infection. 2 of 4 bottles (aerobic bottle in both sets) from 4/17 blood cultures contain staphylococcus epidermidis with methicillin resistance.  Pharmacy has been consulted for vancomycin to daptomycin dosing.  Today, 02/04/2021 Day #4 vancomycin dosing, now changing to daptomycin for antibiotics at home  WBC 21.4, unchanged  Afebrile  4/17 Blood cx: 2/2 sets with S. Hominis and S. Epidermidis (both methicillin susc)  Repeat Blood cx = NGTD  MRI 4/20 - osteomyelitis of foot  CK = 45,  Atorvastatin 20m  Plan:  Daptomycin 7030mIV q24h  CK checked today, check weekly  Noted to be on atorvastatin Monitor renal function daily & signs of clinical improvement   Height: _0  (170.2 cm) Weight: 81.8 kg (180 lb 5.4 oz) IBW/kg (Calculated) : 61.6  Temp (24hrs), Avg:98.1 F (36.7 C), Min:97.5 F (36.4 C), Max:98.5 F (36.9 C)  Recent Labs  Lab 01/31/21 1606 02/01/21 0342 02/02/21 0443 02/03/21 0323 02/04/21 0403  WBC 21.1* 14.2* 20.3* 21.6* 21.4*  CREATININE 1.25* 1.22* 1.25* 1.11* 1.03*    Estimated Creatinine Clearance: 59.9 mL/min (A) (by C-G formula based on SCr of 1.03 mg/dL (H)).    Allergies  Allergen Reactions  . Nitrofurantoin Nausea Only  . Plaquenil [Hydroxychloroquine]   . Tramadol Nausea And Vomiting  . Sulfa Antibiotics Rash    As  an infant    Antimicrobials this admission: Azithromycin 4/17 >> 4/18 Ceftriaxone 4/17 >> 4/18 Vancomycin 4/18 >>  Dose adjustments this admission: N/A  Microbiology results: 4/17 BCx: 2/2 sets: staphylococcus epidermidis, staphylococcus hominis (Methicillin resistant)  Thank you for allowing pharmacy to be a part of this patient's care.  DuDoreene ElandPharmD, BCPS.   Work Cell: [phone]/21/2022 1:56 PM

## 2021-02-04 NOTE — Plan of Care (Signed)

## 2021-02-04 NOTE — Consult Note (Signed)
Reason for Consult: Immunosuppression for sarcoid  Referring Physician: Lina Sayre   HPI: 65 year old white female.  Long history of sarcoidosis.  Hilar adenopathy and interstitial lung disease.  Parenchymal lung disease.  Has been on longstanding prednisone.  Daily.  Doses ranging from 10 to 15 mg.  She has had prior steroid reducing agents such as Plaquenil Imuran and CellCept.  She is currently on methotrexate from orally.  8 pills a week Pulmonary hypertension.  On meds.  She feels that the addition of the pulmonary hypertension events has increased her dyspnea.  She also feels that the methotrexate and prednisone have controlled her sarcoid but she is unable to lower her prednisone dose She has had complications of osteoporosis with compression fractures She had recent ischemic foot with amputation.  She recently had cough and congestion thought to be a respiratory infection.  Presented to the emergency room where she had leukocytosis white count greater than 20,000.  Chest CT showed traction bronchiectasis but no definite infiltrates.  Large pulmonary artery.  Elevated sed rate and CRP.  MRI of the foot shows osteomyelitis at the first MTP she is scheduled for debridement.  Will need long-term antibiotics.  Blood cultures grew methicillin sensitive she has had a PICC line placed White count 21,000.  Sed rate greater than 140.  CRP 14.  On IV Solu-Medrol 40 mg every 12  PMH: Sarcoidosis.  Renal calculi.  Prior urosepsis.  Osteoporosis.  Sleep apnea.  Pulmonary hypertension.  SURGICAL HISTORY: No joint surgery except recent amputation.  Colon surgery.  Lithotripsy.  Tubal ligation  Family History: No history of sarcoid.  Positive family history of rheumatoid arthritis in the father  Social History: No cigarettes or significant alcohol  Allergies:  Allergies  Allergen Reactions  . Nitrofurantoin Nausea Only  . Plaquenil [Hydroxychloroquine]   . Tramadol Nausea And  Vomiting  . Sulfa Antibiotics Rash    As an infant    Medications     ROS: No abdominal pain.  No dysuria.  Joints have not been flared   PHYSICAL EXAM: There were no vitals taken for this visit. Pleasant female.  Oxygen in place.  Lying in bed.  Communicative and with no clubbing.  Trace edema.  Distant lung sounds but no definite rales.  Heart rate regular.  No synovitis  Assessment: Longstanding sarcoidosis currently on prednisone and methotrexate.  Status post Plaquenil Imuran CellCept Recurrent infection now with osteomyelitis.  Bacteremia Peripheral arterial disease Pulmonary hypertension Elevated sed rate and CRP most likely coming from her osteomyelitis and bacteremia  Recommendations: Infliximab has been used for resistant sarcoid but that would carry at an even higher risk of infection.  Methotrexate has not much infection risk but her chronic steroids have significant injection risk. Would agree with surgical and antibiotic treatment of her osteomyelitis.  Liliane Bade W 02/04/2021, 6:54 PM

## 2021-02-05 ENCOUNTER — Telehealth: Payer: Self-pay

## 2021-02-05 DIAGNOSIS — M869 Osteomyelitis, unspecified: Secondary | ICD-10-CM | POA: Diagnosis not present

## 2021-02-05 DIAGNOSIS — I96 Gangrene, not elsewhere classified: Secondary | ICD-10-CM | POA: Diagnosis not present

## 2021-02-05 DIAGNOSIS — J9621 Acute and chronic respiratory failure with hypoxia: Secondary | ICD-10-CM | POA: Diagnosis not present

## 2021-02-05 NOTE — Telephone Encounter (Signed)
FYI noted.  KP

## 2021-02-05 NOTE — Telephone Encounter (Unsigned)
Copied from [place] [id number]. Topic: General - Other >> Feb 04, 2021  4:55 PM Tessa Lerner A wrote: Reason for CRM: Dewana with [facility] has made contact to notify the practice that [facility] will be visiting the patient on Saturday 02/06/21 to initiate pateint's at home physical therapy  Please contact to further advise if needed

## 2021-02-06 DIAGNOSIS — I129 Hypertensive chronic kidney disease with stage 1 through stage 4 chronic kidney disease, or unspecified chronic kidney disease: Secondary | ICD-10-CM | POA: Diagnosis not present

## 2021-02-06 DIAGNOSIS — F32A Depression, unspecified: Secondary | ICD-10-CM | POA: Diagnosis not present

## 2021-02-06 DIAGNOSIS — D860 Sarcoidosis of lung: Secondary | ICD-10-CM | POA: Diagnosis not present

## 2021-02-06 DIAGNOSIS — Z9981 Dependence on supplemental oxygen: Secondary | ICD-10-CM | POA: Diagnosis not present

## 2021-02-06 DIAGNOSIS — J189 Pneumonia, unspecified organism: Secondary | ICD-10-CM | POA: Diagnosis not present

## 2021-02-06 DIAGNOSIS — E039 Hypothyroidism, unspecified: Secondary | ICD-10-CM | POA: Diagnosis not present

## 2021-02-06 DIAGNOSIS — E785 Hyperlipidemia, unspecified: Secondary | ICD-10-CM | POA: Diagnosis not present

## 2021-02-06 DIAGNOSIS — J45909 Unspecified asthma, uncomplicated: Secondary | ICD-10-CM | POA: Diagnosis not present

## 2021-02-06 DIAGNOSIS — I96 Gangrene, not elsewhere classified: Secondary | ICD-10-CM | POA: Diagnosis not present

## 2021-02-06 DIAGNOSIS — G4733 Obstructive sleep apnea (adult) (pediatric): Secondary | ICD-10-CM | POA: Diagnosis not present

## 2021-02-06 DIAGNOSIS — Z419 Encounter for procedure for purposes other than remedying health state, unspecified: Secondary | ICD-10-CM | POA: Diagnosis not present

## 2021-02-06 DIAGNOSIS — N183 Chronic kidney disease, stage 3 unspecified: Secondary | ICD-10-CM | POA: Diagnosis not present

## 2021-02-06 DIAGNOSIS — I272 Pulmonary hypertension, unspecified: Secondary | ICD-10-CM | POA: Diagnosis not present

## 2021-02-06 DIAGNOSIS — H9190 Unspecified hearing loss, unspecified ear: Secondary | ICD-10-CM | POA: Diagnosis not present

## 2021-02-06 DIAGNOSIS — Z4781 Encounter for orthopedic aftercare following surgical amputation: Secondary | ICD-10-CM | POA: Diagnosis not present

## 2021-02-06 DIAGNOSIS — J9611 Chronic respiratory failure with hypoxia: Secondary | ICD-10-CM | POA: Diagnosis not present

## 2021-02-06 LAB — ASPERGILLUS ANTIGEN, BAL/SERUM: Aspergillus Ag, BAL/Serum: 0.51 Index — ABNORMAL HIGH (ref 0.00–0.49)

## 2021-02-07 LAB — CULTURE, BLOOD (SINGLE)
Culture: NO GROWTH
Special Requests: ADEQUATE

## 2021-02-08 DIAGNOSIS — J9621 Acute and chronic respiratory failure with hypoxia: Secondary | ICD-10-CM | POA: Diagnosis not present

## 2021-02-08 DIAGNOSIS — M869 Osteomyelitis, unspecified: Secondary | ICD-10-CM | POA: Diagnosis not present

## 2021-02-08 DIAGNOSIS — B957 Other staphylococcus as the cause of diseases classified elsewhere: Secondary | ICD-10-CM | POA: Diagnosis not present

## 2021-02-08 DIAGNOSIS — Z7689 Persons encountering health services in other specified circumstances: Secondary | ICD-10-CM | POA: Diagnosis not present

## 2021-02-08 DIAGNOSIS — I96 Gangrene, not elsewhere classified: Secondary | ICD-10-CM | POA: Diagnosis not present

## 2021-02-08 DIAGNOSIS — M86172 Other acute osteomyelitis, left ankle and foot: Secondary | ICD-10-CM | POA: Diagnosis not present

## 2021-02-08 DIAGNOSIS — R7881 Bacteremia: Secondary | ICD-10-CM | POA: Diagnosis not present

## 2021-02-09 DIAGNOSIS — J9621 Acute and chronic respiratory failure with hypoxia: Secondary | ICD-10-CM | POA: Diagnosis not present

## 2021-02-09 DIAGNOSIS — J45909 Unspecified asthma, uncomplicated: Secondary | ICD-10-CM | POA: Diagnosis not present

## 2021-02-09 DIAGNOSIS — M869 Osteomyelitis, unspecified: Secondary | ICD-10-CM | POA: Diagnosis not present

## 2021-02-09 DIAGNOSIS — J9611 Chronic respiratory failure with hypoxia: Secondary | ICD-10-CM | POA: Diagnosis not present

## 2021-02-09 DIAGNOSIS — D860 Sarcoidosis of lung: Secondary | ICD-10-CM | POA: Diagnosis not present

## 2021-02-09 DIAGNOSIS — G4733 Obstructive sleep apnea (adult) (pediatric): Secondary | ICD-10-CM | POA: Diagnosis not present

## 2021-02-09 DIAGNOSIS — N183 Chronic kidney disease, stage 3 unspecified: Secondary | ICD-10-CM | POA: Diagnosis not present

## 2021-02-09 DIAGNOSIS — Z9981 Dependence on supplemental oxygen: Secondary | ICD-10-CM | POA: Diagnosis not present

## 2021-02-09 DIAGNOSIS — F32A Depression, unspecified: Secondary | ICD-10-CM | POA: Diagnosis not present

## 2021-02-09 DIAGNOSIS — J189 Pneumonia, unspecified organism: Secondary | ICD-10-CM | POA: Diagnosis not present

## 2021-02-09 DIAGNOSIS — I272 Pulmonary hypertension, unspecified: Secondary | ICD-10-CM | POA: Diagnosis not present

## 2021-02-09 DIAGNOSIS — E785 Hyperlipidemia, unspecified: Secondary | ICD-10-CM | POA: Diagnosis not present

## 2021-02-09 DIAGNOSIS — I96 Gangrene, not elsewhere classified: Secondary | ICD-10-CM | POA: Diagnosis not present

## 2021-02-09 DIAGNOSIS — Z4781 Encounter for orthopedic aftercare following surgical amputation: Secondary | ICD-10-CM | POA: Diagnosis not present

## 2021-02-09 DIAGNOSIS — E039 Hypothyroidism, unspecified: Secondary | ICD-10-CM | POA: Diagnosis not present

## 2021-02-09 DIAGNOSIS — H9190 Unspecified hearing loss, unspecified ear: Secondary | ICD-10-CM | POA: Diagnosis not present

## 2021-02-09 DIAGNOSIS — Z419 Encounter for procedure for purposes other than remedying health state, unspecified: Secondary | ICD-10-CM | POA: Diagnosis not present

## 2021-02-09 DIAGNOSIS — I129 Hypertensive chronic kidney disease with stage 1 through stage 4 chronic kidney disease, or unspecified chronic kidney disease: Secondary | ICD-10-CM | POA: Diagnosis not present

## 2021-02-10 DIAGNOSIS — M869 Osteomyelitis, unspecified: Secondary | ICD-10-CM | POA: Diagnosis not present

## 2021-02-10 DIAGNOSIS — J9621 Acute and chronic respiratory failure with hypoxia: Secondary | ICD-10-CM | POA: Diagnosis not present

## 2021-02-10 DIAGNOSIS — I96 Gangrene, not elsewhere classified: Secondary | ICD-10-CM | POA: Diagnosis not present

## 2021-02-11 DIAGNOSIS — G4733 Obstructive sleep apnea (adult) (pediatric): Secondary | ICD-10-CM | POA: Diagnosis not present

## 2021-02-11 DIAGNOSIS — I129 Hypertensive chronic kidney disease with stage 1 through stage 4 chronic kidney disease, or unspecified chronic kidney disease: Secondary | ICD-10-CM | POA: Diagnosis not present

## 2021-02-11 DIAGNOSIS — I96 Gangrene, not elsewhere classified: Secondary | ICD-10-CM | POA: Diagnosis not present

## 2021-02-11 DIAGNOSIS — J45909 Unspecified asthma, uncomplicated: Secondary | ICD-10-CM | POA: Diagnosis not present

## 2021-02-11 DIAGNOSIS — Z9981 Dependence on supplemental oxygen: Secondary | ICD-10-CM | POA: Diagnosis not present

## 2021-02-11 DIAGNOSIS — M869 Osteomyelitis, unspecified: Secondary | ICD-10-CM | POA: Diagnosis not present

## 2021-02-11 DIAGNOSIS — I272 Pulmonary hypertension, unspecified: Secondary | ICD-10-CM | POA: Diagnosis not present

## 2021-02-11 DIAGNOSIS — F32A Depression, unspecified: Secondary | ICD-10-CM | POA: Diagnosis not present

## 2021-02-11 DIAGNOSIS — J189 Pneumonia, unspecified organism: Secondary | ICD-10-CM | POA: Diagnosis not present

## 2021-02-11 DIAGNOSIS — H9190 Unspecified hearing loss, unspecified ear: Secondary | ICD-10-CM | POA: Diagnosis not present

## 2021-02-11 DIAGNOSIS — Z419 Encounter for procedure for purposes other than remedying health state, unspecified: Secondary | ICD-10-CM | POA: Diagnosis not present

## 2021-02-11 DIAGNOSIS — J9611 Chronic respiratory failure with hypoxia: Secondary | ICD-10-CM | POA: Diagnosis not present

## 2021-02-11 DIAGNOSIS — E785 Hyperlipidemia, unspecified: Secondary | ICD-10-CM | POA: Diagnosis not present

## 2021-02-11 DIAGNOSIS — N183 Chronic kidney disease, stage 3 unspecified: Secondary | ICD-10-CM | POA: Diagnosis not present

## 2021-02-11 DIAGNOSIS — D860 Sarcoidosis of lung: Secondary | ICD-10-CM | POA: Diagnosis not present

## 2021-02-11 DIAGNOSIS — Z4781 Encounter for orthopedic aftercare following surgical amputation: Secondary | ICD-10-CM | POA: Diagnosis not present

## 2021-02-11 DIAGNOSIS — E039 Hypothyroidism, unspecified: Secondary | ICD-10-CM | POA: Diagnosis not present

## 2021-02-11 DIAGNOSIS — J9621 Acute and chronic respiratory failure with hypoxia: Secondary | ICD-10-CM | POA: Diagnosis not present

## 2021-02-12 DIAGNOSIS — I6340 Cerebral infarction due to embolism of unspecified cerebral artery: Secondary | ICD-10-CM | POA: Diagnosis not present

## 2021-02-12 DIAGNOSIS — J9621 Acute and chronic respiratory failure with hypoxia: Secondary | ICD-10-CM | POA: Diagnosis not present

## 2021-02-12 DIAGNOSIS — I96 Gangrene, not elsewhere classified: Secondary | ICD-10-CM | POA: Diagnosis not present

## 2021-02-12 DIAGNOSIS — M869 Osteomyelitis, unspecified: Secondary | ICD-10-CM | POA: Diagnosis not present

## 2021-02-13 DIAGNOSIS — I96 Gangrene, not elsewhere classified: Secondary | ICD-10-CM | POA: Diagnosis not present

## 2021-02-13 DIAGNOSIS — M869 Osteomyelitis, unspecified: Secondary | ICD-10-CM | POA: Diagnosis not present

## 2021-02-13 DIAGNOSIS — J9621 Acute and chronic respiratory failure with hypoxia: Secondary | ICD-10-CM | POA: Diagnosis not present

## 2021-02-15 ENCOUNTER — Other Ambulatory Visit (INDEPENDENT_AMBULATORY_CARE_PROVIDER_SITE_OTHER): Payer: BC Managed Care – PPO | Admitting: Internal Medicine

## 2021-02-15 DIAGNOSIS — Z8673 Personal history of transient ischemic attack (TIA), and cerebral infarction without residual deficits: Secondary | ICD-10-CM

## 2021-02-15 DIAGNOSIS — J4540 Moderate persistent asthma, uncomplicated: Secondary | ICD-10-CM

## 2021-02-15 DIAGNOSIS — I1310 Hypertensive heart and chronic kidney disease without heart failure, with stage 1 through stage 4 chronic kidney disease, or unspecified chronic kidney disease: Secondary | ICD-10-CM

## 2021-02-15 DIAGNOSIS — I70223 Atherosclerosis of native arteries of extremities with rest pain, bilateral legs: Secondary | ICD-10-CM

## 2021-02-15 DIAGNOSIS — I96 Gangrene, not elsewhere classified: Secondary | ICD-10-CM | POA: Diagnosis not present

## 2021-02-15 DIAGNOSIS — H9190 Unspecified hearing loss, unspecified ear: Secondary | ICD-10-CM

## 2021-02-15 DIAGNOSIS — M86172 Other acute osteomyelitis, left ankle and foot: Secondary | ICD-10-CM | POA: Diagnosis not present

## 2021-02-15 DIAGNOSIS — Z4800 Encounter for change or removal of nonsurgical wound dressing: Secondary | ICD-10-CM

## 2021-02-15 DIAGNOSIS — J9621 Acute and chronic respiratory failure with hypoxia: Secondary | ICD-10-CM | POA: Diagnosis not present

## 2021-02-15 DIAGNOSIS — D860 Sarcoidosis of lung: Secondary | ICD-10-CM

## 2021-02-15 DIAGNOSIS — G4733 Obstructive sleep apnea (adult) (pediatric): Secondary | ICD-10-CM

## 2021-02-15 DIAGNOSIS — B957 Other staphylococcus as the cause of diseases classified elsewhere: Secondary | ICD-10-CM | POA: Diagnosis not present

## 2021-02-15 DIAGNOSIS — Z7982 Long term (current) use of aspirin: Secondary | ICD-10-CM

## 2021-02-15 DIAGNOSIS — Z7689 Persons encountering health services in other specified circumstances: Secondary | ICD-10-CM | POA: Diagnosis not present

## 2021-02-15 DIAGNOSIS — Z89422 Acquired absence of other left toe(s): Secondary | ICD-10-CM | POA: Insufficient documentation

## 2021-02-15 DIAGNOSIS — N189 Chronic kidney disease, unspecified: Secondary | ICD-10-CM

## 2021-02-15 DIAGNOSIS — Z9981 Dependence on supplemental oxygen: Secondary | ICD-10-CM

## 2021-02-15 DIAGNOSIS — M869 Osteomyelitis, unspecified: Secondary | ICD-10-CM | POA: Diagnosis not present

## 2021-02-15 DIAGNOSIS — I70229 Atherosclerosis of native arteries of extremities with rest pain, unspecified extremity: Secondary | ICD-10-CM

## 2021-02-15 DIAGNOSIS — R7881 Bacteremia: Secondary | ICD-10-CM | POA: Diagnosis not present

## 2021-02-15 DIAGNOSIS — Z89412 Acquired absence of left great toe: Secondary | ICD-10-CM | POA: Insufficient documentation

## 2021-02-15 DIAGNOSIS — I272 Pulmonary hypertension, unspecified: Secondary | ICD-10-CM

## 2021-02-15 NOTE — Progress Notes (Signed)
Received orders from [facility]. Start of care 02/06/21.   Orders from 02/06/21 through 04/06/21 are reviewed, signed and faxed.

## 2021-02-16 DIAGNOSIS — H9190 Unspecified hearing loss, unspecified ear: Secondary | ICD-10-CM | POA: Diagnosis not present

## 2021-02-16 DIAGNOSIS — Z419 Encounter for procedure for purposes other than remedying health state, unspecified: Secondary | ICD-10-CM | POA: Diagnosis not present

## 2021-02-16 DIAGNOSIS — J45909 Unspecified asthma, uncomplicated: Secondary | ICD-10-CM | POA: Diagnosis not present

## 2021-02-16 DIAGNOSIS — I96 Gangrene, not elsewhere classified: Secondary | ICD-10-CM | POA: Diagnosis not present

## 2021-02-16 DIAGNOSIS — N183 Chronic kidney disease, stage 3 unspecified: Secondary | ICD-10-CM | POA: Diagnosis not present

## 2021-02-16 DIAGNOSIS — Z9981 Dependence on supplemental oxygen: Secondary | ICD-10-CM | POA: Diagnosis not present

## 2021-02-16 DIAGNOSIS — E039 Hypothyroidism, unspecified: Secondary | ICD-10-CM | POA: Diagnosis not present

## 2021-02-16 DIAGNOSIS — D860 Sarcoidosis of lung: Secondary | ICD-10-CM | POA: Diagnosis not present

## 2021-02-16 DIAGNOSIS — J189 Pneumonia, unspecified organism: Secondary | ICD-10-CM | POA: Diagnosis not present

## 2021-02-16 DIAGNOSIS — I129 Hypertensive chronic kidney disease with stage 1 through stage 4 chronic kidney disease, or unspecified chronic kidney disease: Secondary | ICD-10-CM | POA: Diagnosis not present

## 2021-02-16 DIAGNOSIS — G4733 Obstructive sleep apnea (adult) (pediatric): Secondary | ICD-10-CM | POA: Diagnosis not present

## 2021-02-16 DIAGNOSIS — J9611 Chronic respiratory failure with hypoxia: Secondary | ICD-10-CM | POA: Diagnosis not present

## 2021-02-16 DIAGNOSIS — I272 Pulmonary hypertension, unspecified: Secondary | ICD-10-CM | POA: Diagnosis not present

## 2021-02-16 DIAGNOSIS — F32A Depression, unspecified: Secondary | ICD-10-CM | POA: Diagnosis not present

## 2021-02-16 DIAGNOSIS — E785 Hyperlipidemia, unspecified: Secondary | ICD-10-CM | POA: Diagnosis not present

## 2021-02-16 DIAGNOSIS — Z4781 Encounter for orthopedic aftercare following surgical amputation: Secondary | ICD-10-CM | POA: Diagnosis not present

## 2021-02-18 DIAGNOSIS — Z4781 Encounter for orthopedic aftercare following surgical amputation: Secondary | ICD-10-CM | POA: Diagnosis not present

## 2021-02-18 DIAGNOSIS — E039 Hypothyroidism, unspecified: Secondary | ICD-10-CM | POA: Diagnosis not present

## 2021-02-18 DIAGNOSIS — N183 Chronic kidney disease, stage 3 unspecified: Secondary | ICD-10-CM | POA: Diagnosis not present

## 2021-02-18 DIAGNOSIS — J45909 Unspecified asthma, uncomplicated: Secondary | ICD-10-CM | POA: Diagnosis not present

## 2021-02-18 DIAGNOSIS — E785 Hyperlipidemia, unspecified: Secondary | ICD-10-CM | POA: Diagnosis not present

## 2021-02-18 DIAGNOSIS — I96 Gangrene, not elsewhere classified: Secondary | ICD-10-CM | POA: Diagnosis not present

## 2021-02-18 DIAGNOSIS — Z9981 Dependence on supplemental oxygen: Secondary | ICD-10-CM | POA: Diagnosis not present

## 2021-02-18 DIAGNOSIS — F32A Depression, unspecified: Secondary | ICD-10-CM | POA: Diagnosis not present

## 2021-02-18 DIAGNOSIS — H9190 Unspecified hearing loss, unspecified ear: Secondary | ICD-10-CM | POA: Diagnosis not present

## 2021-02-18 DIAGNOSIS — D860 Sarcoidosis of lung: Secondary | ICD-10-CM | POA: Diagnosis not present

## 2021-02-18 DIAGNOSIS — I272 Pulmonary hypertension, unspecified: Secondary | ICD-10-CM | POA: Diagnosis not present

## 2021-02-18 DIAGNOSIS — I129 Hypertensive chronic kidney disease with stage 1 through stage 4 chronic kidney disease, or unspecified chronic kidney disease: Secondary | ICD-10-CM | POA: Diagnosis not present

## 2021-02-18 DIAGNOSIS — J189 Pneumonia, unspecified organism: Secondary | ICD-10-CM | POA: Diagnosis not present

## 2021-02-18 DIAGNOSIS — G4733 Obstructive sleep apnea (adult) (pediatric): Secondary | ICD-10-CM | POA: Diagnosis not present

## 2021-02-18 DIAGNOSIS — Z419 Encounter for procedure for purposes other than remedying health state, unspecified: Secondary | ICD-10-CM | POA: Diagnosis not present

## 2021-02-18 DIAGNOSIS — J9611 Chronic respiratory failure with hypoxia: Secondary | ICD-10-CM | POA: Diagnosis not present

## 2021-02-20 DIAGNOSIS — M869 Osteomyelitis, unspecified: Secondary | ICD-10-CM | POA: Diagnosis not present

## 2021-02-20 DIAGNOSIS — J9621 Acute and chronic respiratory failure with hypoxia: Secondary | ICD-10-CM | POA: Diagnosis not present

## 2021-02-20 DIAGNOSIS — I96 Gangrene, not elsewhere classified: Secondary | ICD-10-CM | POA: Diagnosis not present

## 2021-02-21 DIAGNOSIS — G4733 Obstructive sleep apnea (adult) (pediatric): Secondary | ICD-10-CM | POA: Diagnosis not present

## 2021-02-22 DIAGNOSIS — J9621 Acute and chronic respiratory failure with hypoxia: Secondary | ICD-10-CM | POA: Diagnosis not present

## 2021-02-22 DIAGNOSIS — Z7689 Persons encountering health services in other specified circumstances: Secondary | ICD-10-CM | POA: Diagnosis not present

## 2021-02-22 DIAGNOSIS — I96 Gangrene, not elsewhere classified: Secondary | ICD-10-CM | POA: Diagnosis not present

## 2021-02-22 DIAGNOSIS — M869 Osteomyelitis, unspecified: Secondary | ICD-10-CM | POA: Diagnosis not present

## 2021-02-24 ENCOUNTER — Ambulatory Visit: Payer: BC Managed Care – PPO

## 2021-02-24 DIAGNOSIS — E039 Hypothyroidism, unspecified: Secondary | ICD-10-CM | POA: Diagnosis not present

## 2021-02-24 DIAGNOSIS — Z9981 Dependence on supplemental oxygen: Secondary | ICD-10-CM | POA: Diagnosis not present

## 2021-02-24 DIAGNOSIS — Z4781 Encounter for orthopedic aftercare following surgical amputation: Secondary | ICD-10-CM | POA: Diagnosis not present

## 2021-02-24 DIAGNOSIS — N183 Chronic kidney disease, stage 3 unspecified: Secondary | ICD-10-CM | POA: Diagnosis not present

## 2021-02-24 DIAGNOSIS — M86279 Subacute osteomyelitis, unspecified ankle and foot: Secondary | ICD-10-CM | POA: Diagnosis not present

## 2021-02-24 DIAGNOSIS — I129 Hypertensive chronic kidney disease with stage 1 through stage 4 chronic kidney disease, or unspecified chronic kidney disease: Secondary | ICD-10-CM | POA: Diagnosis not present

## 2021-02-24 DIAGNOSIS — J45909 Unspecified asthma, uncomplicated: Secondary | ICD-10-CM | POA: Diagnosis not present

## 2021-02-24 DIAGNOSIS — G4733 Obstructive sleep apnea (adult) (pediatric): Secondary | ICD-10-CM | POA: Diagnosis not present

## 2021-02-24 DIAGNOSIS — F32A Depression, unspecified: Secondary | ICD-10-CM | POA: Diagnosis not present

## 2021-02-24 DIAGNOSIS — J189 Pneumonia, unspecified organism: Secondary | ICD-10-CM | POA: Diagnosis not present

## 2021-02-24 DIAGNOSIS — I96 Gangrene, not elsewhere classified: Secondary | ICD-10-CM | POA: Diagnosis not present

## 2021-02-24 DIAGNOSIS — H9190 Unspecified hearing loss, unspecified ear: Secondary | ICD-10-CM | POA: Diagnosis not present

## 2021-02-24 DIAGNOSIS — I272 Pulmonary hypertension, unspecified: Secondary | ICD-10-CM | POA: Diagnosis not present

## 2021-02-24 DIAGNOSIS — E785 Hyperlipidemia, unspecified: Secondary | ICD-10-CM | POA: Diagnosis not present

## 2021-02-24 DIAGNOSIS — J9611 Chronic respiratory failure with hypoxia: Secondary | ICD-10-CM | POA: Diagnosis not present

## 2021-02-24 DIAGNOSIS — D860 Sarcoidosis of lung: Secondary | ICD-10-CM | POA: Diagnosis not present

## 2021-02-24 DIAGNOSIS — Z419 Encounter for procedure for purposes other than remedying health state, unspecified: Secondary | ICD-10-CM | POA: Diagnosis not present

## 2021-02-26 DIAGNOSIS — Z4781 Encounter for orthopedic aftercare following surgical amputation: Secondary | ICD-10-CM | POA: Diagnosis not present

## 2021-02-26 DIAGNOSIS — D860 Sarcoidosis of lung: Secondary | ICD-10-CM | POA: Diagnosis not present

## 2021-02-26 DIAGNOSIS — H9190 Unspecified hearing loss, unspecified ear: Secondary | ICD-10-CM | POA: Diagnosis not present

## 2021-02-26 DIAGNOSIS — I96 Gangrene, not elsewhere classified: Secondary | ICD-10-CM | POA: Diagnosis not present

## 2021-02-26 DIAGNOSIS — J9611 Chronic respiratory failure with hypoxia: Secondary | ICD-10-CM | POA: Diagnosis not present

## 2021-02-26 DIAGNOSIS — J189 Pneumonia, unspecified organism: Secondary | ICD-10-CM | POA: Diagnosis not present

## 2021-02-26 DIAGNOSIS — I272 Pulmonary hypertension, unspecified: Secondary | ICD-10-CM | POA: Diagnosis not present

## 2021-02-26 DIAGNOSIS — Z419 Encounter for procedure for purposes other than remedying health state, unspecified: Secondary | ICD-10-CM | POA: Diagnosis not present

## 2021-02-26 DIAGNOSIS — G4733 Obstructive sleep apnea (adult) (pediatric): Secondary | ICD-10-CM | POA: Diagnosis not present

## 2021-02-26 DIAGNOSIS — Z9981 Dependence on supplemental oxygen: Secondary | ICD-10-CM | POA: Diagnosis not present

## 2021-02-26 DIAGNOSIS — I129 Hypertensive chronic kidney disease with stage 1 through stage 4 chronic kidney disease, or unspecified chronic kidney disease: Secondary | ICD-10-CM | POA: Diagnosis not present

## 2021-02-26 DIAGNOSIS — F32A Depression, unspecified: Secondary | ICD-10-CM | POA: Diagnosis not present

## 2021-02-26 DIAGNOSIS — J45909 Unspecified asthma, uncomplicated: Secondary | ICD-10-CM | POA: Diagnosis not present

## 2021-02-26 DIAGNOSIS — N183 Chronic kidney disease, stage 3 unspecified: Secondary | ICD-10-CM | POA: Diagnosis not present

## 2021-02-26 DIAGNOSIS — E785 Hyperlipidemia, unspecified: Secondary | ICD-10-CM | POA: Diagnosis not present

## 2021-02-26 DIAGNOSIS — E039 Hypothyroidism, unspecified: Secondary | ICD-10-CM | POA: Diagnosis not present

## 2021-02-27 DIAGNOSIS — J9621 Acute and chronic respiratory failure with hypoxia: Secondary | ICD-10-CM | POA: Diagnosis not present

## 2021-02-27 DIAGNOSIS — M869 Osteomyelitis, unspecified: Secondary | ICD-10-CM | POA: Diagnosis not present

## 2021-02-27 DIAGNOSIS — I96 Gangrene, not elsewhere classified: Secondary | ICD-10-CM | POA: Diagnosis not present

## 2021-03-01 DIAGNOSIS — R7881 Bacteremia: Secondary | ICD-10-CM | POA: Diagnosis not present

## 2021-03-01 DIAGNOSIS — J9621 Acute and chronic respiratory failure with hypoxia: Secondary | ICD-10-CM | POA: Diagnosis not present

## 2021-03-01 DIAGNOSIS — M86172 Other acute osteomyelitis, left ankle and foot: Secondary | ICD-10-CM | POA: Diagnosis not present

## 2021-03-01 DIAGNOSIS — I96 Gangrene, not elsewhere classified: Secondary | ICD-10-CM | POA: Diagnosis not present

## 2021-03-01 DIAGNOSIS — M869 Osteomyelitis, unspecified: Secondary | ICD-10-CM | POA: Diagnosis not present

## 2021-03-01 DIAGNOSIS — B957 Other staphylococcus as the cause of diseases classified elsewhere: Secondary | ICD-10-CM | POA: Diagnosis not present

## 2021-03-02 DIAGNOSIS — J189 Pneumonia, unspecified organism: Secondary | ICD-10-CM | POA: Diagnosis not present

## 2021-03-02 DIAGNOSIS — E039 Hypothyroidism, unspecified: Secondary | ICD-10-CM | POA: Diagnosis not present

## 2021-03-02 DIAGNOSIS — D860 Sarcoidosis of lung: Secondary | ICD-10-CM | POA: Diagnosis not present

## 2021-03-02 DIAGNOSIS — N183 Chronic kidney disease, stage 3 unspecified: Secondary | ICD-10-CM | POA: Diagnosis not present

## 2021-03-02 DIAGNOSIS — I129 Hypertensive chronic kidney disease with stage 1 through stage 4 chronic kidney disease, or unspecified chronic kidney disease: Secondary | ICD-10-CM | POA: Diagnosis not present

## 2021-03-02 DIAGNOSIS — I272 Pulmonary hypertension, unspecified: Secondary | ICD-10-CM | POA: Diagnosis not present

## 2021-03-02 DIAGNOSIS — E785 Hyperlipidemia, unspecified: Secondary | ICD-10-CM | POA: Diagnosis not present

## 2021-03-02 DIAGNOSIS — J45909 Unspecified asthma, uncomplicated: Secondary | ICD-10-CM | POA: Diagnosis not present

## 2021-03-02 DIAGNOSIS — I96 Gangrene, not elsewhere classified: Secondary | ICD-10-CM | POA: Diagnosis not present

## 2021-03-02 DIAGNOSIS — H9190 Unspecified hearing loss, unspecified ear: Secondary | ICD-10-CM | POA: Diagnosis not present

## 2021-03-02 DIAGNOSIS — F32A Depression, unspecified: Secondary | ICD-10-CM | POA: Diagnosis not present

## 2021-03-02 DIAGNOSIS — G4733 Obstructive sleep apnea (adult) (pediatric): Secondary | ICD-10-CM | POA: Diagnosis not present

## 2021-03-02 DIAGNOSIS — J9611 Chronic respiratory failure with hypoxia: Secondary | ICD-10-CM | POA: Diagnosis not present

## 2021-03-02 DIAGNOSIS — Z419 Encounter for procedure for purposes other than remedying health state, unspecified: Secondary | ICD-10-CM | POA: Diagnosis not present

## 2021-03-02 DIAGNOSIS — Z4781 Encounter for orthopedic aftercare following surgical amputation: Secondary | ICD-10-CM | POA: Diagnosis not present

## 2021-03-02 DIAGNOSIS — Z9981 Dependence on supplemental oxygen: Secondary | ICD-10-CM | POA: Diagnosis not present

## 2021-03-03 ENCOUNTER — Other Ambulatory Visit: Payer: Self-pay | Admitting: Internal Medicine

## 2021-03-06 DIAGNOSIS — I96 Gangrene, not elsewhere classified: Secondary | ICD-10-CM | POA: Diagnosis not present

## 2021-03-06 DIAGNOSIS — M869 Osteomyelitis, unspecified: Secondary | ICD-10-CM | POA: Diagnosis not present

## 2021-03-06 DIAGNOSIS — J9621 Acute and chronic respiratory failure with hypoxia: Secondary | ICD-10-CM | POA: Diagnosis not present

## 2021-03-08 DIAGNOSIS — M86172 Other acute osteomyelitis, left ankle and foot: Secondary | ICD-10-CM | POA: Diagnosis not present

## 2021-03-08 DIAGNOSIS — J9621 Acute and chronic respiratory failure with hypoxia: Secondary | ICD-10-CM | POA: Diagnosis not present

## 2021-03-08 DIAGNOSIS — R7881 Bacteremia: Secondary | ICD-10-CM | POA: Diagnosis not present

## 2021-03-08 DIAGNOSIS — B957 Other staphylococcus as the cause of diseases classified elsewhere: Secondary | ICD-10-CM | POA: Diagnosis not present

## 2021-03-08 DIAGNOSIS — M869 Osteomyelitis, unspecified: Secondary | ICD-10-CM | POA: Diagnosis not present

## 2021-03-08 DIAGNOSIS — I96 Gangrene, not elsewhere classified: Secondary | ICD-10-CM | POA: Diagnosis not present

## 2021-03-09 DIAGNOSIS — I96 Gangrene, not elsewhere classified: Secondary | ICD-10-CM | POA: Diagnosis not present

## 2021-03-09 DIAGNOSIS — I272 Pulmonary hypertension, unspecified: Secondary | ICD-10-CM | POA: Diagnosis not present

## 2021-03-09 DIAGNOSIS — Z419 Encounter for procedure for purposes other than remedying health state, unspecified: Secondary | ICD-10-CM | POA: Diagnosis not present

## 2021-03-09 DIAGNOSIS — I129 Hypertensive chronic kidney disease with stage 1 through stage 4 chronic kidney disease, or unspecified chronic kidney disease: Secondary | ICD-10-CM | POA: Diagnosis not present

## 2021-03-09 DIAGNOSIS — Z4781 Encounter for orthopedic aftercare following surgical amputation: Secondary | ICD-10-CM | POA: Diagnosis not present

## 2021-03-09 DIAGNOSIS — J189 Pneumonia, unspecified organism: Secondary | ICD-10-CM | POA: Diagnosis not present

## 2021-03-09 DIAGNOSIS — J45909 Unspecified asthma, uncomplicated: Secondary | ICD-10-CM | POA: Diagnosis not present

## 2021-03-09 DIAGNOSIS — J9611 Chronic respiratory failure with hypoxia: Secondary | ICD-10-CM | POA: Diagnosis not present

## 2021-03-09 DIAGNOSIS — Z9981 Dependence on supplemental oxygen: Secondary | ICD-10-CM | POA: Diagnosis not present

## 2021-03-09 DIAGNOSIS — M869 Osteomyelitis, unspecified: Secondary | ICD-10-CM | POA: Diagnosis not present

## 2021-03-09 DIAGNOSIS — E039 Hypothyroidism, unspecified: Secondary | ICD-10-CM | POA: Diagnosis not present

## 2021-03-09 DIAGNOSIS — N183 Chronic kidney disease, stage 3 unspecified: Secondary | ICD-10-CM | POA: Diagnosis not present

## 2021-03-09 DIAGNOSIS — G4733 Obstructive sleep apnea (adult) (pediatric): Secondary | ICD-10-CM | POA: Diagnosis not present

## 2021-03-09 DIAGNOSIS — D860 Sarcoidosis of lung: Secondary | ICD-10-CM | POA: Diagnosis not present

## 2021-03-09 DIAGNOSIS — H9190 Unspecified hearing loss, unspecified ear: Secondary | ICD-10-CM | POA: Diagnosis not present

## 2021-03-09 DIAGNOSIS — E785 Hyperlipidemia, unspecified: Secondary | ICD-10-CM | POA: Diagnosis not present

## 2021-03-09 DIAGNOSIS — J9621 Acute and chronic respiratory failure with hypoxia: Secondary | ICD-10-CM | POA: Diagnosis not present

## 2021-03-09 DIAGNOSIS — F32A Depression, unspecified: Secondary | ICD-10-CM | POA: Diagnosis not present

## 2021-03-14 DIAGNOSIS — I6340 Cerebral infarction due to embolism of unspecified cerebral artery: Secondary | ICD-10-CM | POA: Diagnosis not present

## 2021-03-24 ENCOUNTER — Ambulatory Visit: Payer: BC Managed Care – PPO | Admitting: Urology

## 2021-03-24 DIAGNOSIS — D869 Sarcoidosis, unspecified: Secondary | ICD-10-CM | POA: Diagnosis not present

## 2021-03-24 DIAGNOSIS — G4733 Obstructive sleep apnea (adult) (pediatric): Secondary | ICD-10-CM | POA: Diagnosis not present

## 2021-03-24 DIAGNOSIS — R0602 Shortness of breath: Secondary | ICD-10-CM | POA: Diagnosis not present

## 2021-03-31 ENCOUNTER — Other Ambulatory Visit: Payer: BC Managed Care – PPO

## 2021-03-31 ENCOUNTER — Other Ambulatory Visit: Payer: Self-pay

## 2021-03-31 DIAGNOSIS — R3129 Other microscopic hematuria: Secondary | ICD-10-CM | POA: Diagnosis not present

## 2021-03-31 DIAGNOSIS — N200 Calculus of kidney: Secondary | ICD-10-CM | POA: Diagnosis not present

## 2021-04-01 ENCOUNTER — Telehealth: Payer: Self-pay | Admitting: *Deleted

## 2021-04-01 ENCOUNTER — Ambulatory Visit: Admission: RE | Admit: 2021-04-01 | Payer: BC Managed Care – PPO | Source: Ambulatory Visit

## 2021-04-01 NOTE — Telephone Encounter (Signed)
Ultrasound called to report patient No Showed for RUS

## 2021-04-02 ENCOUNTER — Telehealth: Payer: Self-pay

## 2021-04-02 LAB — MICROSCOPIC EXAMINATION

## 2021-04-02 LAB — URINALYSIS, COMPLETE
Bilirubin, UA: NEGATIVE
Glucose, UA: NEGATIVE
Ketones, UA: NEGATIVE
Nitrite, UA: NEGATIVE
Protein,UA: NEGATIVE
Specific Gravity, UA: 1.02 (ref 1.005–1.030)
Urobilinogen, Ur: 0.2 mg/dL (ref 0.2–1.0)
pH, UA: 5.5 (ref 5.0–7.5)

## 2021-04-02 NOTE — Telephone Encounter (Signed)
-----  Message from Billey Co, MD sent at 04/02/2021  7:30 AM EDT ----- No microscopic blood or infection on UA  Nickolas Madrid, MD 04/02/2021

## 2021-04-02 NOTE — Telephone Encounter (Signed)
Mychart notification sent

## 2021-04-03 LAB — CULTURE, URINE COMPREHENSIVE

## 2021-04-05 ENCOUNTER — Other Ambulatory Visit: Payer: Self-pay | Admitting: *Deleted

## 2021-04-05 MED ORDER — CIPROFLOXACIN HCL 500 MG PO TABS: 500.0000 mg | ORAL_TABLET | Freq: Two times a day (BID) | ORAL | 0 refills | Status: AC

## 2021-04-06 ENCOUNTER — Other Ambulatory Visit: Payer: Self-pay | Admitting: Urology

## 2021-04-14 DIAGNOSIS — I6340 Cerebral infarction due to embolism of unspecified cerebral artery: Secondary | ICD-10-CM | POA: Diagnosis not present

## 2021-04-20 ENCOUNTER — Telehealth: Payer: Self-pay | Admitting: Urology

## 2021-04-20 NOTE — Telephone Encounter (Signed)
Patient called the office this morning to report that she saw a small amount of blood in her urine again this morning.    (She came in on 6/15 for a UA & culture with no signs of hematuria or bacteria.)  I reviewed this information with the patient.  She said that the blood in her urine comes and goes.  She is just looking for advice.

## 2021-04-21 ENCOUNTER — Encounter: Payer: Self-pay | Admitting: Urology

## 2021-04-21 ENCOUNTER — Ambulatory Visit
Admission: RE | Admit: 2021-04-21 | Discharge: 2021-04-21 | Disposition: A | Discharge status: Home / Self Care | Payer: BC Managed Care – PPO | Attending: Urology | Admitting: Urology

## 2021-04-21 ENCOUNTER — Ambulatory Visit
Admission: RE | Admit: 2021-04-21 | Discharge: 2021-04-21 | Disposition: A | Discharge status: Home / Self Care | Payer: BC Managed Care – PPO | Source: Ambulatory Visit | Attending: Urology | Admitting: Urology

## 2021-04-21 ENCOUNTER — Ambulatory Visit (INDEPENDENT_AMBULATORY_CARE_PROVIDER_SITE_OTHER): Payer: BC Managed Care – PPO | Admitting: Urology

## 2021-04-21 ENCOUNTER — Other Ambulatory Visit: Payer: Self-pay

## 2021-04-21 VITALS — BP 174/112 | HR 116 | Temp 98.0°F | Ht 67.0 in | Wt 168.0 lb

## 2021-04-21 DIAGNOSIS — N390 Urinary tract infection, site not specified: Secondary | ICD-10-CM | POA: Diagnosis not present

## 2021-04-21 DIAGNOSIS — R310 Gross hematuria: Secondary | ICD-10-CM

## 2021-04-21 DIAGNOSIS — R109 Unspecified abdominal pain: Secondary | ICD-10-CM | POA: Diagnosis not present

## 2021-04-21 DIAGNOSIS — N200 Calculus of kidney: Secondary | ICD-10-CM

## 2021-04-21 LAB — URINALYSIS, COMPLETE
Bilirubin, UA: NEGATIVE
Glucose, UA: NEGATIVE
Ketones, UA: NEGATIVE
Leukocytes,UA: NEGATIVE
Nitrite, UA: NEGATIVE
Protein,UA: NEGATIVE
Specific Gravity, UA: 1.015 (ref 1.005–1.030)
Urobilinogen, Ur: 0.2 mg/dL (ref 0.2–1.0)
pH, UA: 7 (ref 5.0–7.5)

## 2021-04-21 LAB — MICROSCOPIC EXAMINATION
Bacteria, UA: NONE SEEN
Epithelial Cells (non renal): NONE SEEN /hpf (ref 0–10)
WBC, UA: NONE SEEN /hpf (ref 0–5)

## 2021-04-21 MED ORDER — CIPROFLOXACIN HCL 500 MG PO TABS: 500.0000 mg | ORAL_TABLET | Freq: Two times a day (BID) | ORAL | 0 refills | Status: DC

## 2021-04-21 NOTE — Progress Notes (Signed)
04/21/2021 8:36 AM   Stacie [name] 1955-12-02 [id number]  Referring provider: Glean Hess, MD [address] [place],  [place] [address]  Urological history: 1. Nephrolithiasis -left URS 02/2020 -bilateral URS 09/2020 -Stone composed of 60% calcium oxalate monohydrate and 40% calcium oxalate dihydrate.    2. rUTI's -contributing factors of age and vaginal atrophy -documented positive urine cultures over the last year  03/31/2021 staphylococcus cohnii resistant to oxacillin and PCN and enterococcus faecalis resistant to tetracycline   09/16/2020 yeast   08/26/2020 Hafnia alvei resistant to amoxicillin/clavulanic acid, ampicillin and cefazolin   07/09/2020 Klebsiella pneumoniae resistant to ampicillin  06/12/2020 Klebsiella pneumoniae resistant to ampicillin  3. Vaginal atrophy      Chief Complaint  Patient presents with   Recurrent UTI    HPI: Stacie [name] is a 65 y.o. female who presents today with a family friend, Stacie [name], for UTI symptoms.  She states she had tripped about one month ago and started to have back pain.  She attributed it to a muscle strain and took NSAID's and used Lidocaine patches.     The back pain did not respond as expected to the pain medications and she started to experience urinary symptoms associated with low grade fevers.    She is having frequency, urgency, dysuria, lower abdominal pain and intermittent gross hematuria.    Her UA today is negative.    PMH: Past Medical History:  Diagnosis Date   Acute respiratory failure with hypoxia ([place])    Arrhythmia    patient unaware if this is current   Asthma    Chronic kidney disease    Depression    GERD (gastroesophageal reflux disease)    Heart murmur    History of kidney stones    HOH (hard of hearing)    wear aids   Hyperthyroidism    Hypothyroidism    IBS (irritable bowel syndrome)    Pneumonia    Pulmonary hypertension ([place])    Sarcoid    Sarcoidosis     Seasonal allergies    Sleep apnea CPAP with O2   Stroke ([place]) 07/2020   [facility]   Wears hearing aid in both ears     Surgical History: Past Surgical History:  Procedure Laterality Date   ABDOMINAL HYSTERECTOMY     partial   AMPUTATION Left 01/14/2021   Procedure: AMPUTATION RAY (1ST TOE ) ( 2ND METATARSAL HEAD RESECTION);  Surgeon: Algernon Huxley, MD;  Location: [facility];  Service: Vascular;  Laterality: Left;   CARDIAC CATHETERIZATION  10/18/2018   [facility]   CATARACT EXTRACTION W/PHACO Left 07/31/2019   Procedure: CATARACT EXTRACTION PHACO AND INTRAOCULAR LENS PLACEMENT (IOC) LEFT 00:51.1  17.9%  9.15;  Surgeon: Leandrew Koyanagi, MD;  Location: [facility];  Service: Ophthalmology;  Laterality: Left;  keep this patient second   COLON SURGERY     "colon was fused to bladder - operated on both"   COLONOSCOPY  09/18/2007   diverticuli, no polyps   COLONOSCOPY  05/26/2010   diverticuli, no polyps   CYSTOSCOPY WITH STENT PLACEMENT Bilateral 07/14/2020   Procedure: CYSTOSCOPY WITH STENT PLACEMENT, RETROPYLOGRAM;  Surgeon: Billey Co, MD;  Location: [facility];  Service: Urology;  Laterality: Bilateral;   CYSTOSCOPY/URETEROSCOPY/HOLMIUM LASER/STENT PLACEMENT Left 02/20/2020   Procedure: CYSTOSCOPY/URETEROSCOPY/LITHOTRIPSY /STENT PLACEMENT;  Surgeon: Hollice Espy, MD;  Location: [facility];  Service: Urology;  Laterality: Left;   CYSTOSCOPY/URETEROSCOPY/HOLMIUM LASER/STENT PLACEMENT Bilateral 10/02/2020   Procedure: CYSTOSCOPY/URETEROSCOPY/HOLMIUM LASER/STENT PLACEMENT;  Surgeon: Billey Co, MD;  Location: [facility];  Service: Urology;  Laterality: Bilateral;   EYE SURGERY     PARS PLANA VITRECTOMY Right 05/20/2015   Procedure: PARS PLANA VITRECTOMY WITH 25 GAUGE, laser;  Surgeon: Milus Height, MD;  Location: [facility];  Service: Ophthalmology;  Laterality: Right;   PARTIAL HYSTERECTOMY  1990   TEE WITHOUT CARDIOVERSION N/A 07/27/2020   Procedure: TRANSESOPHAGEAL  ECHOCARDIOGRAM (TEE);  Surgeon: Teodoro Spray, MD;  Location: [facility];  Service: Cardiovascular;  Laterality: N/A;   TUBAL LIGATION     WISDOM TOOTH EXTRACTION      Home Medications:  Allergies as of 04/21/2021       Reactions   Nitrofurantoin Nausea Only   Plaquenil [hydroxychloroquine]    Tramadol Nausea And Vomiting   Sulfa Antibiotics Rash   As an infant        Medication List        Accurate as of April 21, 2021 11:59 PM. If you have any questions, ask your nurse or doctor.          acetaminophen 500 MG tablet Commonly known as: TYLENOL Take 500 mg by mouth every 6 (six) hours as needed for moderate pain or headache.   albuterol (2.5 MG/3ML) 0.083% nebulizer solution Commonly known as: PROVENTIL Inhale 2.5 mg into the lungs every 6 (six) hours as needed for wheezing or shortness of breath.   aspirin 81 MG EC tablet Take 1 tablet (81 mg total) by mouth daily. Swallow whole.   atorvastatin 40 MG tablet Commonly known as: LIPITOR Take 1 tablet (40 mg total) by mouth daily. Resume once daptomycin is finished   budesonide-formoterol 80-4.5 MCG/ACT inhaler Commonly known as: Symbicort Take 2 puffs first thing in am and then another 2 puffs about 12 hours later. What changed:  how much to take how to take this when to take this additional instructions   ciprofloxacin 500 MG tablet Commonly known as: CIPRO Take 1 tablet (500 mg total) by mouth every 12 (twelve) hours. Started by: Zara Council, PA-C   denosumab 60 MG/ML Sosy injection Commonly known as: PROLIA Inject 60 mg into the skin every 6 (six) months.   dorzolamide 2 % ophthalmic solution Commonly known as: TRUSOPT Place 1 drop into both eyes 2 (two) times daily.   ergocalciferol 1.25 MG (50000 UT) capsule Commonly known as: VITAMIN D2 Take 50,000 Units by mouth once a week. On tuesday   escitalopram 20 MG tablet Commonly known as: LEXAPRO TAKE 1 TABLET BY MOUTH EVERY DAY   folic acid 1 MG  tablet Commonly known as: FOLVITE Take 1 tablet (1 mg total) by mouth daily.   HYDROcodone-acetaminophen 5-325 MG tablet Commonly known as: Norco Take 1 tablet by mouth every 6 (six) hours as needed for moderate pain.   latanoprost 0.005 % ophthalmic solution Commonly known as: XALATAN Place 1 drop into the right eye at bedtime.   levothyroxine 137 MCG tablet Commonly known as: Synthroid TAKE 1 TABLET (137 MCG TOTAL) BY MOUTH DAILY BEFORE BREAKFAST. What changed:  how much to take how to take this when to take this additional instructions   methotrexate 2.5 MG tablet Commonly known as: RHEUMATREX Take 10 tablets (25 mg total) by mouth every Tuesday.   multivitamin with minerals tablet Take 1 tablet by mouth daily.   NON FORMULARY CPAP nightly   OXYGEN Inhale 2 L into the lungs daily as needed (During walking and activity).   potassium citrate 10 MEQ (1080  MG) SR tablet Commonly known as: UROCIT-K Take 2 tablets (20 mEq total) by mouth 3 (three) times daily with meals.   predniSONE 5 MG tablet Commonly known as: DELTASONE Please take 40 mg oral daily x2 days, then 30 mg oral daily x2 days, then continue 15 mg oral daily after that.   Treprostinil 0.6 MG/ML Soln Commonly known as: TYVASO Inhale 0.6 mcg into the lungs See admin instructions. 12 breaths 4 times a day   VITAMIN B COMPLEX PO Take 1 tablet by mouth daily.        Allergies:  Allergies  Allergen Reactions   Nitrofurantoin Nausea Only   Plaquenil [Hydroxychloroquine]    Tramadol Nausea And Vomiting   Sulfa Antibiotics Rash    As an infant    Family History: Family History  Problem Relation Age of Onset   Allergies Father    Asthma Father    Colon cancer Father    Allergies Brother    Asthma Brother    Breast cancer Maternal Grandmother     Social History:  reports that she has never smoked. She has never used smokeless tobacco. She reports that she does not drink alcohol and does not  use drugs.  ROS: Pertinent ROS in HPI  Physical Exam: BP (!) 174/112   Pulse (!) 116   Temp 98 F (36.7 C) (Oral)   Ht _0  (1.702 m)   Wt 168 lb (76.2 kg)   BMI 26.31 kg/m   Constitutional:  Well nourished. Alert and oriented, No acute distress. HEENT: [place] AT, mask in place.  Trachea midline Cardiovascular: No clubbing, cyanosis, or edema. Respiratory: Normal respiratory effort, no increased work of breathing. Neurologic: Grossly intact, no focal deficits, moving all 4 extremities. Psychiatric: Normal mood and affect.  Laboratory Data: Lab Results  Component Value Date   WBC 21.4 (H) 02/04/2021   HGB 10.9 (L) 02/04/2021   HCT 34.2 (L) 02/04/2021   MCV 93.7 02/04/2021   PLT 297 02/04/2021    Lab Results  Component Value Date   CREATININE 1.03 (H) 02/04/2021    Lab Results  Component Value Date   HGBA1C 6.4 (H) 07/16/2020    Lab Results  Component Value Date   TSH 0.332 (L) 07/10/2020       Component Value Date/Time   CHOL 289 (H) 07/21/2020 0617   CHOL 215 (H) 05/27/2019 0947   HDL 33 (L) 07/21/2020 0617   HDL 59 05/27/2019 0947   CHOLHDL 8.8 07/21/2020 0617   VLDL 58 (H) 07/21/2020 0617   LDLCALC 198 (H) 07/21/2020 0617   LDLCALC 125 (H) 05/27/2019 0947    Lab Results  Component Value Date   AST 25 02/04/2021   Lab Results  Component Value Date   ALT 28 02/04/2021    Urinalysis Component     Latest Ref Rng & Units 04/21/2021  Specific Gravity, UA     1.005 - 1.030 1.015  pH, UA     5.0 - 7.5 7.0  Color, UA     Yellow Yellow  Appearance Ur     Clear Clear  Leukocytes,UA     Negative Negative  Protein,UA     Negative/Trace Negative  Glucose, UA     Negative Negative  Ketones, UA     Negative Negative  RBC, UA     Negative Trace (A)  Bilirubin, UA     Negative Negative  Urobilinogen, Ur     0.2 - 1.0 mg/dL 0.2  Nitrite, UA  Negative Negative  Microscopic Examination      See below:   Component     Latest Ref Rng & Units  04/21/2021          WBC, UA     0 - 5 /hpf None seen  RBC     0 - 2 /hpf 0-2  Epithelial Cells (non renal)     0 - 10 /hpf None seen  Bacteria, UA     None seen/Few None seen  I have reviewed the labs.   Pertinent Imaging: CLINICAL DATA:  Flank pain, history of urolithiasis   EXAM: ABDOMEN - 1 VIEW   COMPARISON:  07/15/2020 abdominal radiograph. 07/17/2020 CT abdomen/pelvis.   FINDINGS: Interval removal of bilateral nephroureteral stents. No radiopaque stones overlie the kidneys or expected locations of the ureters or bladder. Several surgical clips overlie the sacrum in the midline. No dilated small bowel loops. Mild colonic stool. No evidence of pneumatosis or pneumoperitoneum. Moderate lumbar spondylosis.   IMPRESSION: No radiopaque urolithiasis.     Electronically Signed   By: Ilona Sorrel M.D.   On: 04/22/2021 13:49  I have independently reviewed the films.    Assessment & Plan:    1. Recurrent UTI -Urinalysis, Complete -Urine culture -urinalysis is negative, but will send for culture to rule out indolent infection as she is symptomatic at this time -start Cipro 500 mg, twice daily for seven days -RUS is pending -KUB this afternoon  2. Lower back pain -history of nephrolithiasis, UA negative for micro heme -RUS pending   3. Nephrolithiasis -RUS and KUB pending   No follow-ups on file.  These notes generated with voice recognition software. I apologize for typographical errors.  Zara Council, PA-C  [facility] [address]  [place], [place] [address] [phone]

## 2021-04-22 NOTE — Telephone Encounter (Signed)
Patient called the office today to see if we had the results of her xray and urine culture.  I advised her that we do not have the results yet.   She reports sharp left sided low back pain that comes and goes.  She describes the pain on a scale of 0-10 at a 9 when it is at its worst and 6 regularly.   Please advise on how to proceed with patient.

## 2021-04-23 DIAGNOSIS — D869 Sarcoidosis, unspecified: Secondary | ICD-10-CM | POA: Diagnosis not present

## 2021-04-23 DIAGNOSIS — R0602 Shortness of breath: Secondary | ICD-10-CM | POA: Diagnosis not present

## 2021-04-23 NOTE — Telephone Encounter (Signed)
Advised per Zara Council, PA:   At this time, we need to wait on the final urine culture result and renal ultrasound.  So far, her back pain does not appear to be from an urological source.  She may want to touch base with her PCP in the meantime as well to see if there are other causes of her back pain.   Patient voiced understanding

## 2021-04-25 LAB — CULTURE, URINE COMPREHENSIVE

## 2021-04-26 ENCOUNTER — Ambulatory Visit
Admission: RE | Admit: 2021-04-26 | Discharge: 2021-04-26 | Disposition: A | Discharge status: Home / Self Care | Payer: BC Managed Care – PPO | Source: Ambulatory Visit | Attending: Urology | Admitting: Urology

## 2021-04-26 ENCOUNTER — Other Ambulatory Visit: Payer: Self-pay

## 2021-04-26 ENCOUNTER — Other Ambulatory Visit: Payer: Self-pay | Admitting: Urology

## 2021-04-26 DIAGNOSIS — R109 Unspecified abdominal pain: Secondary | ICD-10-CM | POA: Insufficient documentation

## 2021-04-26 DIAGNOSIS — N1330 Unspecified hydronephrosis: Secondary | ICD-10-CM | POA: Diagnosis not present

## 2021-04-26 DIAGNOSIS — R310 Gross hematuria: Secondary | ICD-10-CM | POA: Insufficient documentation

## 2021-04-26 MED ORDER — HYDROCODONE-ACETAMINOPHEN 5-325 MG PO TABS: 1.0000 | ORAL_TABLET | Freq: Four times a day (QID) | ORAL | 0 refills | Status: DC | PRN

## 2021-04-26 NOTE — Progress Notes (Signed)
Spoke with Stacie [name] regarding her RUS images.  Still waiting on final read.  I have sent in Vicodin 5/325, one q 4 hours for pain, prn, #10.

## 2021-04-27 ENCOUNTER — Other Ambulatory Visit: Payer: Self-pay | Admitting: Urology

## 2021-04-27 ENCOUNTER — Telehealth: Payer: Self-pay | Admitting: Urology

## 2021-04-27 ENCOUNTER — Ambulatory Visit
Admission: RE | Admit: 2021-04-27 | Discharge: 2021-04-27 | Disposition: A | Discharge status: Home / Self Care | Payer: BC Managed Care – PPO | Source: Ambulatory Visit | Attending: Urology | Admitting: Urology

## 2021-04-27 DIAGNOSIS — R109 Unspecified abdominal pain: Secondary | ICD-10-CM

## 2021-04-27 DIAGNOSIS — M4856XA Collapsed vertebra, not elsewhere classified, lumbar region, initial encounter for fracture: Secondary | ICD-10-CM | POA: Diagnosis not present

## 2021-04-27 DIAGNOSIS — N200 Calculus of kidney: Secondary | ICD-10-CM | POA: Diagnosis not present

## 2021-04-27 DIAGNOSIS — K5730 Diverticulosis of large intestine without perforation or abscess without bleeding: Secondary | ICD-10-CM | POA: Diagnosis not present

## 2021-04-27 DIAGNOSIS — R310 Gross hematuria: Secondary | ICD-10-CM | POA: Diagnosis not present

## 2021-04-27 DIAGNOSIS — K449 Diaphragmatic hernia without obstruction or gangrene: Secondary | ICD-10-CM | POA: Diagnosis not present

## 2021-04-27 NOTE — Telephone Encounter (Signed)
I have spoken to Stacie [name] regarding her CT scan results and she has seen Dr. Trenton Gammon in the past and will contact him regarding the compression fractures.

## 2021-04-27 NOTE — Progress Notes (Signed)
I spoke with Mrs. Piechota regarding the need for a CT and have placed orders for a STAT CT.

## 2021-05-04 ENCOUNTER — Ambulatory Visit: Payer: BC Managed Care – PPO

## 2021-05-05 ENCOUNTER — Other Ambulatory Visit: Payer: Self-pay | Admitting: Orthopedic Surgery

## 2021-05-05 DIAGNOSIS — S22020G Wedge compression fracture of second thoracic vertebra, subsequent encounter for fracture with delayed healing: Secondary | ICD-10-CM

## 2021-05-06 ENCOUNTER — Ambulatory Visit
Admission: RE | Admit: 2021-05-06 | Discharge: 2021-05-06 | Disposition: A | Discharge status: Home / Self Care | Payer: BC Managed Care – PPO | Source: Ambulatory Visit | Attending: Orthopedic Surgery | Admitting: Orthopedic Surgery

## 2021-05-06 ENCOUNTER — Emergency Department: Payer: BC Managed Care – PPO

## 2021-05-06 ENCOUNTER — Inpatient Hospital Stay
Admission: EM | Admit: 2021-05-06 | Discharge: 2021-05-11 | DRG: 982 | Disposition: A | Discharge status: Home / Self Care | Payer: BC Managed Care – PPO | Attending: Internal Medicine | Admitting: Internal Medicine

## 2021-05-06 ENCOUNTER — Encounter: Payer: Self-pay | Admitting: Emergency Medicine

## 2021-05-06 ENCOUNTER — Other Ambulatory Visit: Payer: Self-pay

## 2021-05-06 ENCOUNTER — Inpatient Hospital Stay: Payer: BC Managed Care – PPO

## 2021-05-06 DIAGNOSIS — E1122 Type 2 diabetes mellitus with diabetic chronic kidney disease: Secondary | ICD-10-CM | POA: Diagnosis present

## 2021-05-06 DIAGNOSIS — E039 Hypothyroidism, unspecified: Secondary | ICD-10-CM | POA: Diagnosis present

## 2021-05-06 DIAGNOSIS — M810 Age-related osteoporosis without current pathological fracture: Secondary | ICD-10-CM | POA: Diagnosis present

## 2021-05-06 DIAGNOSIS — Z882 Allergy status to sulfonamides status: Secondary | ICD-10-CM

## 2021-05-06 DIAGNOSIS — S32040A Wedge compression fracture of fourth lumbar vertebra, initial encounter for closed fracture: Secondary | ICD-10-CM | POA: Diagnosis not present

## 2021-05-06 DIAGNOSIS — E11621 Type 2 diabetes mellitus with foot ulcer: Secondary | ICD-10-CM | POA: Diagnosis present

## 2021-05-06 DIAGNOSIS — K219 Gastro-esophageal reflux disease without esophagitis: Secondary | ICD-10-CM | POA: Diagnosis present

## 2021-05-06 DIAGNOSIS — R0902 Hypoxemia: Secondary | ICD-10-CM | POA: Diagnosis not present

## 2021-05-06 DIAGNOSIS — R6510 Systemic inflammatory response syndrome (SIRS) of non-infectious origin without acute organ dysfunction: Secondary | ICD-10-CM | POA: Diagnosis present

## 2021-05-06 DIAGNOSIS — I6523 Occlusion and stenosis of bilateral carotid arteries: Secondary | ICD-10-CM | POA: Diagnosis not present

## 2021-05-06 DIAGNOSIS — D860 Sarcoidosis of lung: Secondary | ICD-10-CM | POA: Diagnosis not present

## 2021-05-06 DIAGNOSIS — D849 Immunodeficiency, unspecified: Secondary | ICD-10-CM | POA: Diagnosis present

## 2021-05-06 DIAGNOSIS — S32022A Unstable burst fracture of second lumbar vertebra, initial encounter for closed fracture: Secondary | ICD-10-CM | POA: Diagnosis not present

## 2021-05-06 DIAGNOSIS — Z79899 Other long term (current) drug therapy: Secondary | ICD-10-CM

## 2021-05-06 DIAGNOSIS — S32009D Unspecified fracture of unspecified lumbar vertebra, subsequent encounter for fracture with routine healing: Secondary | ICD-10-CM | POA: Diagnosis not present

## 2021-05-06 DIAGNOSIS — Z89412 Acquired absence of left great toe: Secondary | ICD-10-CM

## 2021-05-06 DIAGNOSIS — Z825 Family history of asthma and other chronic lower respiratory diseases: Secondary | ICD-10-CM

## 2021-05-06 DIAGNOSIS — S91101A Unspecified open wound of right great toe without damage to nail, initial encounter: Secondary | ICD-10-CM | POA: Diagnosis not present

## 2021-05-06 DIAGNOSIS — Z7952 Long term (current) use of systemic steroids: Secondary | ICD-10-CM

## 2021-05-06 DIAGNOSIS — Z8673 Personal history of transient ischemic attack (TIA), and cerebral infarction without residual deficits: Secondary | ICD-10-CM

## 2021-05-06 DIAGNOSIS — I959 Hypotension, unspecified: Secondary | ICD-10-CM | POA: Diagnosis present

## 2021-05-06 DIAGNOSIS — F32A Depression, unspecified: Secondary | ICD-10-CM | POA: Diagnosis not present

## 2021-05-06 DIAGNOSIS — D869 Sarcoidosis, unspecified: Secondary | ICD-10-CM | POA: Diagnosis not present

## 2021-05-06 DIAGNOSIS — R0689 Other abnormalities of breathing: Secondary | ICD-10-CM | POA: Diagnosis not present

## 2021-05-06 DIAGNOSIS — R42 Dizziness and giddiness: Secondary | ICD-10-CM | POA: Diagnosis not present

## 2021-05-06 DIAGNOSIS — Z7951 Long term (current) use of inhaled steroids: Secondary | ICD-10-CM

## 2021-05-06 DIAGNOSIS — J849 Interstitial pulmonary disease, unspecified: Secondary | ICD-10-CM | POA: Diagnosis present

## 2021-05-06 DIAGNOSIS — L97519 Non-pressure chronic ulcer of other part of right foot with unspecified severity: Secondary | ICD-10-CM | POA: Diagnosis present

## 2021-05-06 DIAGNOSIS — Z9981 Dependence on supplemental oxygen: Secondary | ICD-10-CM

## 2021-05-06 DIAGNOSIS — E872 Acidosis: Secondary | ICD-10-CM | POA: Diagnosis not present

## 2021-05-06 DIAGNOSIS — E860 Dehydration: Secondary | ICD-10-CM

## 2021-05-06 DIAGNOSIS — G4733 Obstructive sleep apnea (adult) (pediatric): Secondary | ICD-10-CM | POA: Diagnosis present

## 2021-05-06 DIAGNOSIS — N183 Chronic kidney disease, stage 3 unspecified: Secondary | ICD-10-CM | POA: Diagnosis not present

## 2021-05-06 DIAGNOSIS — E0590 Thyrotoxicosis, unspecified without thyrotoxic crisis or storm: Secondary | ICD-10-CM | POA: Diagnosis not present

## 2021-05-06 DIAGNOSIS — S32032A Unstable burst fracture of third lumbar vertebra, initial encounter for closed fracture: Secondary | ICD-10-CM | POA: Diagnosis not present

## 2021-05-06 DIAGNOSIS — S32000A Wedge compression fracture of unspecified lumbar vertebra, initial encounter for closed fracture: Secondary | ICD-10-CM | POA: Diagnosis present

## 2021-05-06 DIAGNOSIS — J45909 Unspecified asthma, uncomplicated: Secondary | ICD-10-CM | POA: Diagnosis present

## 2021-05-06 DIAGNOSIS — S32030A Wedge compression fracture of third lumbar vertebra, initial encounter for closed fracture: Secondary | ICD-10-CM | POA: Diagnosis not present

## 2021-05-06 DIAGNOSIS — N179 Acute kidney failure, unspecified: Secondary | ICD-10-CM | POA: Diagnosis not present

## 2021-05-06 DIAGNOSIS — H9190 Unspecified hearing loss, unspecified ear: Secondary | ICD-10-CM | POA: Diagnosis present

## 2021-05-06 DIAGNOSIS — S91309A Unspecified open wound, unspecified foot, initial encounter: Secondary | ICD-10-CM | POA: Diagnosis not present

## 2021-05-06 DIAGNOSIS — S32020A Wedge compression fracture of second lumbar vertebra, initial encounter for closed fracture: Secondary | ICD-10-CM | POA: Diagnosis present

## 2021-05-06 DIAGNOSIS — R55 Syncope and collapse: Secondary | ICD-10-CM | POA: Diagnosis not present

## 2021-05-06 DIAGNOSIS — Z888 Allergy status to other drugs, medicaments and biological substances status: Secondary | ICD-10-CM

## 2021-05-06 DIAGNOSIS — Z79890 Hormone replacement therapy (postmenopausal): Secondary | ICD-10-CM

## 2021-05-06 DIAGNOSIS — S91309D Unspecified open wound, unspecified foot, subsequent encounter: Secondary | ICD-10-CM | POA: Diagnosis not present

## 2021-05-06 DIAGNOSIS — M545 Low back pain, unspecified: Secondary | ICD-10-CM | POA: Diagnosis not present

## 2021-05-06 DIAGNOSIS — M7989 Other specified soft tissue disorders: Secondary | ICD-10-CM | POA: Diagnosis not present

## 2021-05-06 DIAGNOSIS — J9611 Chronic respiratory failure with hypoxia: Secondary | ICD-10-CM | POA: Diagnosis present

## 2021-05-06 DIAGNOSIS — Z20822 Contact with and (suspected) exposure to covid-19: Secondary | ICD-10-CM | POA: Diagnosis not present

## 2021-05-06 DIAGNOSIS — Z419 Encounter for procedure for purposes other than remedying health state, unspecified: Secondary | ICD-10-CM

## 2021-05-06 DIAGNOSIS — S32042A Unstable burst fracture of fourth lumbar vertebra, initial encounter for closed fracture: Secondary | ICD-10-CM | POA: Diagnosis not present

## 2021-05-06 DIAGNOSIS — I272 Pulmonary hypertension, unspecified: Secondary | ICD-10-CM | POA: Diagnosis present

## 2021-05-06 DIAGNOSIS — Z7982 Long term (current) use of aspirin: Secondary | ICD-10-CM

## 2021-05-06 LAB — CBC
HCT: 36.6 % (ref 36.0–46.0)
Hemoglobin: 11.8 g/dL — ABNORMAL LOW (ref 12.0–15.0)
MCH: 30.8 pg (ref 26.0–34.0)
MCHC: 32.2 g/dL (ref 30.0–36.0)
MCV: 95.6 fL (ref 80.0–100.0)
Platelets: 295 10*3/uL (ref 150–400)
RBC: 3.83 MIL/uL — ABNORMAL LOW (ref 3.87–5.11)
RDW: 14.4 % (ref 11.5–15.5)
WBC: 24.7 10*3/uL — ABNORMAL HIGH (ref 4.0–10.5)
nRBC: 0 % (ref 0.0–0.2)

## 2021-05-06 LAB — URINALYSIS, COMPLETE (UACMP) WITH MICROSCOPIC
Bilirubin Urine: NEGATIVE
Glucose, UA: NEGATIVE mg/dL
Ketones, ur: NEGATIVE mg/dL
Leukocytes,Ua: NEGATIVE
Nitrite: NEGATIVE
Protein, ur: NEGATIVE mg/dL
Specific Gravity, Urine: 1.01 (ref 1.005–1.030)
Squamous Epithelial / HPF: NONE SEEN (ref 0–5)
pH: 8 (ref 5.0–8.0)

## 2021-05-06 LAB — RESP PANEL BY RT-PCR (FLU A&B, COVID) ARPGX2
Influenza A by PCR: NEGATIVE
Influenza B by PCR: NEGATIVE
SARS Coronavirus 2 by RT PCR: NEGATIVE

## 2021-05-06 LAB — LACTIC ACID, PLASMA: Lactic Acid, Venous: 2.2 mmol/L (ref 0.5–1.9)

## 2021-05-06 LAB — LIPASE, BLOOD: Lipase: 21 U/L (ref 11–51)

## 2021-05-06 LAB — COMPREHENSIVE METABOLIC PANEL
ALT: 22 U/L (ref 0–44)
AST: 27 U/L (ref 15–41)
Albumin: 3.1 g/dL — ABNORMAL LOW (ref 3.5–5.0)
Alkaline Phosphatase: 67 U/L (ref 38–126)
Anion gap: 13 (ref 5–15)
BUN: 29 mg/dL — ABNORMAL HIGH (ref 8–23)
CO2: 29 mmol/L (ref 22–32)
Calcium: 8.9 mg/dL (ref 8.9–10.3)
Chloride: 95 mmol/L — ABNORMAL LOW (ref 98–111)
Creatinine, Ser: 1.56 mg/dL — ABNORMAL HIGH (ref 0.44–1.00)
GFR, Estimated: 36 mL/min — ABNORMAL LOW (ref >60)
Glucose, Bld: 124 mg/dL — ABNORMAL HIGH (ref 70–99)
Potassium: 3.5 mmol/L (ref 3.5–5.1)
Sodium: 137 mmol/L (ref 135–145)
Total Bilirubin: 1.1 mg/dL (ref 0.3–1.2)
Total Protein: 6.9 g/dL (ref 6.5–8.1)

## 2021-05-06 LAB — TROPONIN I (HIGH SENSITIVITY)
Troponin I (High Sensitivity): 16 ng/L (ref <18)
Troponin I (High Sensitivity): 14 ng/L (ref <18)

## 2021-05-06 MED ORDER — SODIUM CHLORIDE 0.9 % IV SOLN
2.0000 g | Freq: Two times a day (BID) | INTRAVENOUS | Status: DC
  Administered 2021-05-06 – 2021-05-11 (×10): 2 g via INTRAVENOUS
  Filled 2021-05-06 (×12): qty 2

## 2021-05-06 MED ORDER — LATANOPROST 0.005 % OP SOLN
1.0000 [drp] | Freq: Every day | OPHTHALMIC | Status: DC
  Administered 2021-05-06 – 2021-05-10 (×5): 1 [drp] via OPHTHALMIC
  Filled 2021-05-06: qty 2.5

## 2021-05-06 MED ORDER — SODIUM CHLORIDE 0.9 % IV SOLN
2.0000 g | Freq: Once | INTRAVENOUS | Status: AC
  Administered 2021-05-06: 2 g via INTRAVENOUS
  Filled 2021-05-06: qty 2

## 2021-05-06 MED ORDER — ALBUTEROL SULFATE (2.5 MG/3ML) 0.083% IN NEBU
2.5000 mg | INHALATION_SOLUTION | Freq: Four times a day (QID) | RESPIRATORY_TRACT | Status: DC | PRN
  Administered 2021-05-07: 10:00:00 2.5 mg via RESPIRATORY_TRACT
  Filled 2021-05-06: qty 3

## 2021-05-06 MED ORDER — VANCOMYCIN HCL 750 MG/150ML IV SOLN
750.0000 mg | Freq: Once | INTRAVENOUS | Status: AC
  Administered 2021-05-06: 19:00:00 750 mg via INTRAVENOUS
  Filled 2021-05-06 (×2): qty 150

## 2021-05-06 MED ORDER — FOLIC ACID 1 MG PO TABS
1.0000 mg | ORAL_TABLET | Freq: Every day | ORAL | Status: DC
  Administered 2021-05-07 – 2021-05-11 (×5): 1 mg via ORAL
  Filled 2021-05-06 (×5): qty 1

## 2021-05-06 MED ORDER — ACETAMINOPHEN 500 MG PO TABS: 500.0000 mg | ORAL_TABLET | Freq: Four times a day (QID) | ORAL | Status: DC | PRN

## 2021-05-06 MED ORDER — ADULT MULTIVITAMIN W/MINERALS CH
1.0000 | ORAL_TABLET | Freq: Every day | ORAL | Status: DC
  Administered 2021-05-07 – 2021-05-11 (×5): 1 via ORAL
  Filled 2021-05-06 (×5): qty 1

## 2021-05-06 MED ORDER — DORZOLAMIDE HCL 2 % OP SOLN
1.0000 [drp] | Freq: Two times a day (BID) | OPHTHALMIC | Status: DC
  Administered 2021-05-06 – 2021-05-11 (×10): 1 [drp] via OPHTHALMIC
  Filled 2021-05-06: qty 10

## 2021-05-06 MED ORDER — FLUTICASONE FUROATE-VILANTEROL 100-25 MCG/INH IN AEPB
1.0000 | INHALATION_SPRAY | Freq: Every day | RESPIRATORY_TRACT | Status: DC
  Administered 2021-05-07 – 2021-05-11 (×5): 1 via RESPIRATORY_TRACT
  Filled 2021-05-06: qty 28

## 2021-05-06 MED ORDER — B COMPLEX-C PO TABS
1.0000 | ORAL_TABLET | Freq: Every day | ORAL | Status: DC
  Administered 2021-05-07 – 2021-05-11 (×5): 1 via ORAL
  Filled 2021-05-06 (×5): qty 1

## 2021-05-06 MED ORDER — VANCOMYCIN HCL IN DEXTROSE 1-5 GM/200ML-% IV SOLN
1000.0000 mg | Freq: Once | INTRAVENOUS | Status: AC
  Administered 2021-05-06: 1000 mg via INTRAVENOUS
  Filled 2021-05-06: qty 200

## 2021-05-06 MED ORDER — SODIUM CHLORIDE 0.9 % IV BOLUS
1000.0000 mL | Freq: Once | INTRAVENOUS | Status: AC
  Administered 2021-05-06: 1000 mL via INTRAVENOUS

## 2021-05-06 MED ORDER — VITAMIN B COMPLEX PO TABS: ORAL_TABLET | Freq: Every day | ORAL | Status: DC

## 2021-05-06 MED ORDER — METHOTREXATE 2.5 MG PO TABS: 25.0000 mg | ORAL_TABLET | ORAL | Status: DC

## 2021-05-06 MED ORDER — TREPROSTINIL 0.6 MG/ML IN SOLN
72.0000 ug | Freq: Four times a day (QID) | RESPIRATORY_TRACT | Status: DC
  Administered 2021-05-07 – 2021-05-11 (×12): 72 ug via RESPIRATORY_TRACT
  Filled 2021-05-06: qty 2.9

## 2021-05-06 MED ORDER — ESCITALOPRAM OXALATE 20 MG PO TABS
20.0000 mg | ORAL_TABLET | Freq: Every day | ORAL | Status: DC
  Administered 2021-05-07 – 2021-05-11 (×5): 20 mg via ORAL
  Filled 2021-05-06: qty 1
  Filled 2021-05-06 (×4): qty 2

## 2021-05-06 MED ORDER — HEPARIN SODIUM (PORCINE) 5000 UNIT/ML IJ SOLN
5000.0000 [IU] | Freq: Three times a day (TID) | INTRAMUSCULAR | Status: DC
  Administered 2021-05-06 – 2021-05-09 (×8): 5000 [IU] via SUBCUTANEOUS
  Filled 2021-05-06 (×8): qty 1

## 2021-05-06 MED ORDER — LACTATED RINGERS IV SOLN: INTRAVENOUS | Status: DC

## 2021-05-06 MED ORDER — LEVOTHYROXINE SODIUM 137 MCG PO TABS
137.0000 ug | ORAL_TABLET | Freq: Every day | ORAL | Status: DC
  Administered 2021-05-08 – 2021-05-11 (×3): 137 ug via ORAL
  Filled 2021-05-06 (×5): qty 1

## 2021-05-06 MED ORDER — VANCOMYCIN HCL IN DEXTROSE 1-5 GM/200ML-% IV SOLN
1000.0000 mg | INTRAVENOUS | Status: DC
  Filled 2021-05-06: qty 200

## 2021-05-06 MED ORDER — METRONIDAZOLE 500 MG/100ML IV SOLN
500.0000 mg | Freq: Once | INTRAVENOUS | Status: AC
  Administered 2021-05-06: 500 mg via INTRAVENOUS
  Filled 2021-05-06: qty 100

## 2021-05-06 MED ORDER — ASPIRIN EC 81 MG PO TBEC
81.0000 mg | DELAYED_RELEASE_TABLET | Freq: Every day | ORAL | Status: DC
  Administered 2021-05-07 – 2021-05-11 (×5): 81 mg via ORAL
  Filled 2021-05-06 (×5): qty 1

## 2021-05-06 MED ORDER — PREDNISONE 20 MG PO TABS
20.0000 mg | ORAL_TABLET | Freq: Every day | ORAL | Status: DC
  Administered 2021-05-07: 09:00:00 20 mg via ORAL
  Filled 2021-05-06: qty 1

## 2021-05-06 MED ORDER — HYDROCODONE-ACETAMINOPHEN 5-325 MG PO TABS
1.0000 | ORAL_TABLET | Freq: Four times a day (QID) | ORAL | Status: DC | PRN
  Administered 2021-05-06 – 2021-05-11 (×10): 1 via ORAL
  Filled 2021-05-06 (×11): qty 1

## 2021-05-06 MED ORDER — POTASSIUM CITRATE ER 10 MEQ (1080 MG) PO TBCR
20.0000 meq | EXTENDED_RELEASE_TABLET | Freq: Three times a day (TID) | ORAL | Status: DC
  Administered 2021-05-06 – 2021-05-08 (×5): 20 meq via ORAL
  Filled 2021-05-06 (×7): qty 2

## 2021-05-06 NOTE — Progress Notes (Signed)
Pharmacy Antibiotic Note  Stacie [name] is a 65 y.o. female admitted on 05/06/2021 with sepsis.  Pharmacy has been consulted for Vancomycin and Cefepime dosing.  -possible osteomyelitis  Plan: Cefepime 2 gm and vancomycin 1 gram given in ED. Will order another Vancomycin 72m IV x 1 for a total loading dose of 1750 mg -Will continue with Vancomycin 1000 mg IV Q 24 hrs. Goal AUC 400-550. Expected AUC: 551 SCr used: 1.56 Cmin 15.6 (Anticipate some improvement in Scr)  -Cefepime 2gm IV q12h       Weight: 76.2 kg (167 lb 15.9 oz)  Temp (24hrs), Avg:98.2 F (36.8 C), Min:98.2 F (36.8 C), Max:98.2 F (36.8 C)  Recent Labs  Lab 05/06/21 1119  WBC 24.7*  CREATININE 1.56*  LATICACIDVEN 2.2*    Estimated Creatinine Clearance: 37.7 mL/min (A) (by C-G formula based on SCr of 1.56 mg/dL (H)).    Allergies  Allergen Reactions   Nitrofurantoin Nausea Only   Plaquenil [Hydroxychloroquine]    Tramadol Nausea And Vomiting   Sulfa Antibiotics Rash    As an infant    Antimicrobials this admission: Metronidazole x 1 dose Vancomycin  7/21 >>   Cefepime 7/21 >>    Dose adjustments this admission:    Microbiology results: 7/21 BCx: pend UCx:    Sputum:      MRSA PCR:    Thank you for allowing pharmacy to be a part of this patient's care.  Laquetta Racey A 05/06/2021 4:22 PM

## 2021-05-06 NOTE — Progress Notes (Signed)
PHARMACY -  BRIEF ANTIBIOTIC NOTE   Pharmacy has received consult(s) for vancomycin and cefepime from an ED provider.  The patient's profile has been reviewed for ht/wt/allergies/indication/available labs.    One time order(s) placed by MD for vancomycin 1 gram and Cefepime 2 gm  Further antibiotics/pharmacy consults should be ordered by admitting physician if indicated.                       Thank you, Antione Obar A 05/06/2021  1:52 PM

## 2021-05-06 NOTE — ED Provider Notes (Signed)
[facility] Emergency Department Provider Note    Event Date/Time   First MD Initiated Contact with Patient 05/06/21 1105     (approximate)  I have reviewed the triage vital signs and the nursing notes.   HISTORY  Chief Complaint Loss of Consciousness and Weakness    HPI Stacie [name] is a 65 y.o. female extensive past medical history as listed below presents to the ER after 2 syncopal episodes.  Reportedly was feeling unwell yesterday had some stuffy nose congestion and decreased p.o. intake.  No nausea or vomiting denies any abdominal pain.  Has had similar presentation in the past were found to be septic and bacteremic.  She is immunocompromised and history of sarcoidosis on chronic O2.  She denies any chest pain or pressure.  Has not had anything to eat or drink this morning.  Denies any dysuria.  Is complaining of back pain.  Scheduled for outpatient MRI for planned kyphoplasty tomorrow.  Past Medical History:  Diagnosis Date   Acute respiratory failure with hypoxia (HCC)    Arrhythmia    patient unaware if this is current   Asthma    Chronic kidney disease    Depression    GERD (gastroesophageal reflux disease)    Heart murmur    History of kidney stones    HOH (hard of hearing)    wear aids   Hyperthyroidism    Hypothyroidism    IBS (irritable bowel syndrome)    Pneumonia    Pulmonary hypertension ([place])    Sarcoid    Sarcoidosis    Seasonal allergies    Sleep apnea CPAP with O2   Stroke ([place]) 07/2020   [place]   Wears hearing aid in both ears    Family History  Problem Relation Age of Onset   Allergies Father    Asthma Father    Colon cancer Father    Allergies Brother    Asthma Brother    Breast cancer Maternal Grandmother    Past Surgical History:  Procedure Laterality Date   ABDOMINAL HYSTERECTOMY     partial   AMPUTATION Left 01/14/2021   Procedure: AMPUTATION RAY (1ST TOE ) ( 2ND METATARSAL HEAD RESECTION);   Surgeon: Algernon Huxley, MD;  Location: [facility];  Service: Vascular;  Laterality: Left;   CARDIAC CATHETERIZATION  10/18/2018   [facility]   CATARACT EXTRACTION W/PHACO Left 07/31/2019   Procedure: CATARACT EXTRACTION PHACO AND INTRAOCULAR LENS PLACEMENT (IOC) LEFT 00:51.1  17.9%  9.15;  Surgeon: Leandrew Koyanagi, MD;  Location: [facility];  Service: Ophthalmology;  Laterality: Left;  keep this patient second   COLON SURGERY     "colon was fused to bladder - operated on both"   COLONOSCOPY  09/18/2007   diverticuli, no polyps   COLONOSCOPY  05/26/2010   diverticuli, no polyps   CYSTOSCOPY WITH STENT PLACEMENT Bilateral 07/14/2020   Procedure: CYSTOSCOPY WITH STENT PLACEMENT, RETROPYLOGRAM;  Surgeon: Billey Co, MD;  Location: [facility];  Service: Urology;  Laterality: Bilateral;   CYSTOSCOPY/URETEROSCOPY/HOLMIUM LASER/STENT PLACEMENT Left 02/20/2020   Procedure: CYSTOSCOPY/URETEROSCOPY/LITHOTRIPSY /STENT PLACEMENT;  Surgeon: Hollice Espy, MD;  Location: [facility];  Service: Urology;  Laterality: Left;   CYSTOSCOPY/URETEROSCOPY/HOLMIUM LASER/STENT PLACEMENT Bilateral 10/02/2020   Procedure: CYSTOSCOPY/URETEROSCOPY/HOLMIUM LASER/STENT PLACEMENT;  Surgeon: Billey Co, MD;  Location: [facility];  Service: Urology;  Laterality: Bilateral;   EYE SURGERY     PARS PLANA VITRECTOMY Right 05/20/2015   Procedure: PARS PLANA VITRECTOMY WITH 25 GAUGE, laser;  Surgeon: Milus Height, MD;  Location: [facility];  Service: Ophthalmology;  Laterality: Right;   PARTIAL HYSTERECTOMY  1990   TEE WITHOUT CARDIOVERSION N/A 07/27/2020   Procedure: TRANSESOPHAGEAL ECHOCARDIOGRAM (TEE);  Surgeon: Teodoro Spray, MD;  Location: [facility];  Service: Cardiovascular;  Laterality: N/A;   TUBAL LIGATION     WISDOM TOOTH EXTRACTION     Patient Active Problem List   Diagnosis Date Noted   Syncope and collapse 05/06/2021   Acquired absence of left great toe ([place]) 02/15/2021   Acquired absence of other left  toe(s) ([place]) 02/15/2021   Acute on chronic respiratory failure with hypoxia ([place]) 01/31/2021   History of CVA (cerebrovascular accident) 01/31/2021   Gangrene of left foot ([place]) 11/03/2020   Labile blood glucose    Steroid-induced hyperglycemia    Thrombocytopenia (HCC)    Acute blood loss anemia    Slow transit constipation    AKI (acute kidney injury) ([place])    Supplemental oxygen dependent    Klebsiella pneumoniae sepsis (HCC)    Critical lower limb ischemia (HCC)    Acute renal failure (HCC)    Cerebrovascular accident (CVA) due to embolism of cerebral artery ([place])    Sepsis ([place]) 07/10/2020   Pulmonary hypertension ([place]) 12/18/2018   Depression, major, recurrent, in partial remission ([place]) 10/22/2018   Compression fx, thoracic spine ([place]) 12/04/2017   Hyperlipidemia, mixed 04/21/2017   Cough variant asthma  vs UACS  02/20/2017   Hypothyroidism, acquired 03/27/2015   Essential hypertension 03/27/2015   Acquired absence of both cervix and uterus 03/27/2015   H/O renal calculi 03/27/2015   Allergic rhinitis 01/07/2015   Obstructive sleep apnea 09/29/2014   Other malaise and fatigue 06/03/2014   Asthma, chronic 02/24/2014   Cough 04/30/2013   Pulmonary sarcoidosis ([place]) 03/12/2013   CKD (chronic kidney disease) stage 3, GFR 30-59 ml/min ([place]) 03/12/2013   Calcium blood increased 05/23/2012      Prior to Admission medications   Medication Sig Start Date End Date Taking? Authorizing Provider  acetaminophen (TYLENOL) 500 MG tablet Take 500 mg by mouth every 6 (six) hours as needed for moderate pain or headache.    [provider]  albuterol (PROVENTIL) (2.5 MG/3ML) 0.083% nebulizer solution Inhale 2.5 mg into the lungs every 6 (six) hours as needed for wheezing or shortness of breath. 01/04/21 01/04/22  [provider]  aspirin EC 81 MG EC tablet Take 1 tablet (81 mg total) by mouth daily. Swallow whole. 07/29/20   Fritzi Mandes, MD  atorvastatin (LIPITOR) 40 MG  tablet Take 1 tablet (40 mg total) by mouth daily. Resume once daptomycin is finished Patient not taking: Reported on 04/21/2021 03/11/21   Elgergawy, Silver Huguenin, MD  B Complex Vitamins (VITAMIN B COMPLEX PO) Take 1 tablet by mouth daily.    [provider]  budesonide-formoterol (SYMBICORT) 80-4.5 MCG/ACT inhaler Take 2 puffs first thing in am and then another 2 puffs about 12 hours later. Patient taking differently: Inhale 2 puffs into the lungs in the morning and at bedtime. 08/13/20   Angiulli, Lavon Paganini, PA-C  ciprofloxacin (CIPRO) 500 MG tablet Take 1 tablet (500 mg total) by mouth every 12 (twelve) hours. 04/21/21   Zara Council A, PA-C  denosumab (PROLIA) 60 MG/ML SOSY injection Inject 60 mg into the skin every 6 (six) months.    [provider]  dorzolamide (TRUSOPT) 2 % ophthalmic solution Place 1 drop into both eyes 2 (two) times daily.    [provider]  ergocalciferol (VITAMIN D2) 1.25 MG (50000 UT) capsule Take 50,000 Units by mouth once a week. On tuesday 01/22/21   [provider]  escitalopram (LEXAPRO) 20 MG tablet TAKE 1 TABLET BY MOUTH EVERY DAY 03/03/21   Glean Hess, MD  folic acid (FOLVITE) 1 MG tablet Take 1 tablet (1 mg total) by mouth daily. 08/13/20   Angiulli, Lavon Paganini, PA-C  HYDROcodone-acetaminophen (NORCO) 5-325 MG tablet Take 1 tablet by mouth every 6 (six) hours as needed for moderate pain. 04/26/21   McGowan, Larene Beach A, PA-C  latanoprost (XALATAN) 0.005 % ophthalmic solution Place 1 drop into the right eye at bedtime.    [provider]  levothyroxine (SYNTHROID) 137 MCG tablet TAKE 1 TABLET (137 MCG TOTAL) BY MOUTH DAILY BEFORE BREAKFAST. Patient taking differently: Take 137 mcg by mouth daily before breakfast. 12/08/20   Glean Hess, MD  methotrexate (RHEUMATREX) 2.5 MG tablet Take 10 tablets (25 mg total) by mouth every Tuesday. 02/23/21   Elgergawy, Silver Huguenin, MD  Multiple Vitamins-Minerals (MULTIVITAMIN WITH  MINERALS) tablet Take 1 tablet by mouth daily.    [provider]  NON FORMULARY CPAP nightly    [provider]  OXYGEN Inhale 2 L into the lungs daily as needed (During walking and activity).     [provider]  potassium citrate (UROCIT-K) 10 MEQ (1080 MG) SR tablet Take 2 tablets (20 mEq total) by mouth 3 (three) times daily with meals. 11/25/20   Billey Co, MD  predniSONE (DELTASONE) 5 MG tablet Please take 40 mg oral daily x2 days, then 30 mg oral daily x2 days, then continue 15 mg oral daily after that. 02/04/21   Elgergawy, Silver Huguenin, MD  Treprostinil (TYVASO) 0.6 MG/ML SOLN Inhale 0.6 mcg into the lungs See admin instructions. 12 breaths 4 times a day    [provider]    Allergies Nitrofurantoin, Plaquenil [hydroxychloroquine], Tramadol, and Sulfa antibiotics    Social History Social History   Tobacco Use   Smoking status: Never   Smokeless tobacco: Never  Vaping Use   Vaping Use: Never used  Substance Use Topics   Alcohol use: No   Drug use: No    Review of Systems Patient denies headaches, rhinorrhea, blurry vision, numbness, shortness of breath, chest pain, edema, cough, abdominal pain, nausea, vomiting, diarrhea, dysuria, fevers, rashes or hallucinations unless otherwise stated above in HPI. ____________________________________________   PHYSICAL EXAM:  VITAL SIGNS: Vitals:   05/06/21 1059 05/06/21 1200  BP: (!) 86/52 (!) 121/53  Pulse: (!) 103 88  Resp: 18 19  Temp: 98.2 F (36.8 C)   SpO2: 99% 93%    Constitutional: Alert and oriented.  Eyes: Conjunctivae are normal.  Head: Atraumatic. Nose: No congestion/rhinnorhea. Mouth/Throat: Mucous membranes are moist.   Neck: No stridor. Painless ROM.  Cardiovascular: Normal rate, regular rhythm. Grossly normal heart sounds.  Good peripheral circulation. Respiratory: Normal respiratory effort.  No retractions. Lungs CTAB. Gastrointestinal: Soft and nontender. No  distention. No abdominal bruits. No CVA tenderness. Genitourinary:  Musculoskeletal: No lower extremity tenderness nor edema.  No joint effusions. Neurologic:  Normal speech and language. No gross focal neurologic deficits are appreciated. No facial droop Skin:  Skin is warm, dry.  Chronic appearing well healing wound of left foot with healthy granulation tissue, same to tip of right toe Psychiatric: Mood and affect are normal. Speech and behavior are normal.  ____________________________________________   LABS (all labs ordered are listed, but only abnormal results are displayed)  Results for orders placed or performed during the hospital encounter of 05/06/21 (from the past 24 hour(s))  CBC     Status: Abnormal   Collection Time: 05/06/21 11:19 AM  Result Value Ref Range   WBC 24.7 (H) 4.0 - 10.5 K/uL   RBC 3.83 (L) 3.87 - 5.11 MIL/uL   Hemoglobin 11.8 (L) 12.0 - 15.0 g/dL   HCT 36.6 36.0 - 46.0 %   MCV 95.6 80.0 - 100.0 fL   MCH 30.8 26.0 - 34.0 pg   MCHC 32.2 30.0 - 36.0 g/dL   RDW 14.4 11.5 - 15.5 %   Platelets 295 150 - 400 K/uL   nRBC 0.0 0.0 - 0.2 %  Comprehensive metabolic panel     Status: Abnormal   Collection Time: 05/06/21 11:19 AM  Result Value Ref Range   Sodium 137 135 - 145 mmol/L   Potassium 3.5 3.5 - 5.1 mmol/L   Chloride 95 (L) 98 - 111 mmol/L   CO2 29 22 - 32 mmol/L   Glucose, Bld 124 (H) 70 - 99 mg/dL   BUN 29 (H) 8 - 23 mg/dL   Creatinine, Ser 1.56 (H) 0.44 - 1.00 mg/dL   Calcium 8.9 8.9 - 10.3 mg/dL   Total Protein 6.9 6.5 - 8.1 g/dL   Albumin 3.1 (L) 3.5 - 5.0 g/dL   AST 27 15 - 41 U/L   ALT 22 0 - 44 U/L   Alkaline Phosphatase 67 38 - 126 U/L   Total Bilirubin 1.1 0.3 - 1.2 mg/dL   GFR, Estimated 36 (L) >60 mL/min   Anion gap 13 5 - 15  Lipase, blood     Status: None   Collection Time: 05/06/21 11:19 AM  Result Value Ref Range   Lipase 21 11 - 51 U/L  Troponin I (High Sensitivity)     Status: None   Collection Time: 05/06/21 11:19 AM   Result Value Ref Range   Troponin I (High Sensitivity) 16 <18 ng/L  Lactic acid, plasma     Status: Abnormal   Collection Time: 05/06/21 11:19 AM  Result Value Ref Range   Lactic Acid, Venous 2.2 (HH) 0.5 - 1.9 mmol/L   ____________________________________________  EKG My review and personal interpretation at Time: 11:12   Indication: syncope  Rate: 95  Rhythm: sinus Axis: normal Other: nonspecific st abn, no stemi ____________________________________________  RADIOLOGY  I personally reviewed all radiographic images ordered to evaluate for the above acute complaints and reviewed radiology reports and findings.  These findings were personally discussed with the patient.  Please see medical record for radiology report.  ____________________________________________   PROCEDURES  Procedure(s) performed:  Procedures    Critical Care performed: no ____________________________________________   INITIAL IMPRESSION / ASSESSMENT AND PLAN / ED COURSE  Pertinent labs & imaging results that were available during my care of the patient were reviewed by me and considered in my medical decision making (see chart for details).   DDX: Dehydration, sepsis, electrolyte abnormality, AKI, dysrhythmia  Junior A Enriques is a 65 y.o. who presents to the ED with presentation as described above here with multiple syncopal episodes.  Did not fall or hit her head.  Denies any abdominal pain or discomfort at this time.  Has similar presentation found to be septic and bacteremic.  She is hypotensive on arrival but afebrile.  Will give IV fluids.  Blood work sent for by differential  Clinical Course as of 05/06/21 1450  Thu May 06, 2021  1220  White count elevated but appears to be chronically so will give IV hydration.  Troponin negative.  Did not hit her head. [PR]  1314 Lactate is elevated given low blood pressure lactic acidemia as well as leukocytosis will order IV antibiotics cover for sepsis.   She appears to be improving after IV hydration.  Will discuss with hospitalist for admission for reassessment IV hydration and further medical management. [PR]    Clinical Course User Index [PR] Merlyn Lot, MD    The patient was evaluated in Emergency Department today for the symptoms described in the history of present illness. He/she was evaluated in the context of the global COVID-19 pandemic, which necessitated consideration that the patient might be at risk for infection with the SARS-CoV-2 virus that causes COVID-19. Institutional protocols and algorithms that pertain to the evaluation of patients at risk for COVID-19 are in a state of rapid change based on information released by regulatory bodies including the CDC and federal and state organizations. These policies and algorithms were followed during the patient's care in the ED.  As part of my medical decision making, I reviewed the following data within the [place] notes reviewed and incorporated, Labs reviewed, notes from prior ED visits and [place] Controlled Substance Database   ____________________________________________   FINAL CLINICAL IMPRESSION(S) / ED DIAGNOSES  Final diagnoses:  Syncope and collapse  Dehydration      NEW MEDICATIONS STARTED DURING THIS VISIT:  New Prescriptions   No medications on file     Note:  This document was prepared using Dragon voice recognition software and may include unintentional dictation errors.    Merlyn Lot, MD 05/06/21 1450

## 2021-05-06 NOTE — ED Notes (Signed)
Pt presents to ED via EMS with c/o of 2 syncopal episodes today PTA. Pt states HX of sarcoidosis and is chronically on 2L/min via [place].  Pt denies any new SOB. Pt presents with bandages to bilateral feet post/op toe amputation. Pt denies chest pain. Pt denies chills but states "maybe" had a fever this morning but did not take temp. Pt denies urinary symptoms to this RN. Pt is A&Ox4. NAD noted. Pt presents with a soft BP, denies HX of orthostatic hypotension. Daughter at bedside.

## 2021-05-06 NOTE — Consult Note (Signed)
[facility] Nurse Consult Note: Reason for Consult:Patient with left digit amputation in April 2022 (Dr. Lucky Cowboy). New ulceration on RGT. Wound type: neuropathy vs arterial insufficiency Pressure Injury POA:N/A Measurement:TO be obtained by Bedside RN with next dressing change and documented on Nursing Flow Sheet in LxWxD in cm Wound bed: red and yellow Drainage (amount, consistency, odor) small to moderate serous to light yellow Periwound: erythema, edema Dressing procedure/placement/frequency: I have provided Nursing with conservative guidance for topical care and have communicated with Dr. Rowe Pavy regarding a request for a photograph of the wounds for the EMR.  Additionally, I have recommended that a consultation request be considered with Vascular (Dr. Lucky Cowboy) as this is a patient upon whom surgery was performed within the past 3-4 months. Pressure injury prevention interventions are also implemented such as the floatation of heels, turning and repositioning and placement of a prophylactic silicone foam dressing.  [facility] nursing team will not follow, but will remain available to this patient, the nursing and medical teams.  Please re-consult if needed. Thanks, Maudie Flakes, MSN, RN, [place], Arther Abbott  Pager# [phone]

## 2021-05-06 NOTE — Progress Notes (Signed)
Home cpap check. Mask and circuit is good condition. Cpap plugged into red outlet. Powers on. O2 available for bleed in.

## 2021-05-06 NOTE — ED Triage Notes (Signed)
Pt in from home via [place] [facility] after 2 syncopal episodes this morning per daughter. CBG 160, has not eaten this am. BP 90/49 after 167m's NS. A&ox4, denies any cp, n/v

## 2021-05-06 NOTE — H&P (Addendum)
History and Physical    Stacie [name] NKN:[id number] DOB: 02-02-1956 DOA: 05/06/2021  PCP: Glean Hess, MD  Chief Complaint: Syncopal episode x2  HPI: Stacie [name] is a 65 y.o. female with a past medical history of sarcoidosis with chronic hypoxic respiratory failure on 2 L oxygen, history of amputation and osteomyelitis.  The patient presented to the emergency department after having 2 witnessed syncopal episodes on her bed today where she lost consciousness.  Did not fall or hit her head.  Witnessed by the daughter.  No witnessed seizure-like activity.  Prior history of the same a few months back and at that time found to have bacteremia, sepsis and osteomyelitis.  She has been having some poor p.o. intake over the past few days.  Yesterday had some nasal congestion.  Daughter reports subjective fevers.  No urinary complaints including dysuria or frequency.  No vomiting or diarrhea.  Does feel nauseous.  Of note she was going to have an MRI today with planned kyphoplasty this week.  She has a history of osteoporosis.  She is slightly tachycardic in the emergency department.  Other vital signs are stable.  She is chronically immunosuppressed and takes 20 mg of prednisone daily for her sarcoidosis.  Her creatinine is elevated at 1.56 baseline appears to be 1.0-1.2.  Lactic acid is elevated at 2.2.  White blood cell count is chronically elevated due to being on steroids but higher today at 24.  Daughter present at bedside upon my evaluation.    ED Course: The patient has been given vancomycin, cefepime and Flagyl in the emergency department.  The patient has been given IV fluids in the emergency department.  Chest x-ray showed no acute cardiopulmonary process.  Lab work was obtained as well.   Review of Systems: 14 point review of systems is negative except for what is mentioned above in the HPI.   Past Medical History:  Diagnosis Date   Acute respiratory failure with hypoxia ([place])     Arrhythmia    patient unaware if this is current   Asthma    Chronic kidney disease    Depression    GERD (gastroesophageal reflux disease)    Heart murmur    History of kidney stones    HOH (hard of hearing)    wear aids   Hyperthyroidism    Hypothyroidism    IBS (irritable bowel syndrome)    Pneumonia    Pulmonary hypertension ([place])    Sarcoid    Sarcoidosis    Seasonal allergies    Sleep apnea CPAP with O2   Stroke ([place]) 07/2020   [place]   Wears hearing aid in both ears     Past Surgical History:  Procedure Laterality Date   ABDOMINAL HYSTERECTOMY     partial   AMPUTATION Left 01/14/2021   Procedure: AMPUTATION RAY (1ST TOE ) ( 2ND METATARSAL HEAD RESECTION);  Surgeon: Algernon Huxley, MD;  Location: [facility];  Service: Vascular;  Laterality: Left;   CARDIAC CATHETERIZATION  10/18/2018   [facility]   CATARACT EXTRACTION W/PHACO Left 07/31/2019   Procedure: CATARACT EXTRACTION PHACO AND INTRAOCULAR LENS PLACEMENT (IOC) LEFT 00:51.1  17.9%  9.15;  Surgeon: Leandrew Koyanagi, MD;  Location: [place];  Service: Ophthalmology;  Laterality: Left;  keep this patient second   COLON SURGERY     "colon was fused to bladder - operated on both"   COLONOSCOPY  09/18/2007   diverticuli, no polyps   COLONOSCOPY  05/26/2010  diverticuli, no polyps   CYSTOSCOPY WITH STENT PLACEMENT Bilateral 07/14/2020   Procedure: CYSTOSCOPY WITH STENT PLACEMENT, RETROPYLOGRAM;  Surgeon: Billey Co, MD;  Location: [facility];  Service: Urology;  Laterality: Bilateral;   CYSTOSCOPY/URETEROSCOPY/HOLMIUM LASER/STENT PLACEMENT Left 02/20/2020   Procedure: CYSTOSCOPY/URETEROSCOPY/LITHOTRIPSY /STENT PLACEMENT;  Surgeon: Hollice Espy, MD;  Location: [facility];  Service: Urology;  Laterality: Left;   CYSTOSCOPY/URETEROSCOPY/HOLMIUM LASER/STENT PLACEMENT Bilateral 10/02/2020   Procedure: CYSTOSCOPY/URETEROSCOPY/HOLMIUM LASER/STENT PLACEMENT;  Surgeon: Billey Co, MD;  Location: [facility];   Service: Urology;  Laterality: Bilateral;   EYE SURGERY     PARS PLANA VITRECTOMY Right 05/20/2015   Procedure: PARS PLANA VITRECTOMY WITH 25 GAUGE, laser;  Surgeon: Milus Height, MD;  Location: [facility];  Service: Ophthalmology;  Laterality: Right;   PARTIAL HYSTERECTOMY  1990   TEE WITHOUT CARDIOVERSION N/A 07/27/2020   Procedure: TRANSESOPHAGEAL ECHOCARDIOGRAM (TEE);  Surgeon: Teodoro Spray, MD;  Location: [facility];  Service: Cardiovascular;  Laterality: N/A;   TUBAL LIGATION     WISDOM TOOTH EXTRACTION      Social History   Socioeconomic History   Marital status: Married    Spouse name: Not on file   Number of children: 1   Years of education: Not on file   Highest education level: Not on file  Occupational History   Occupation: Homemaker  Tobacco Use   Smoking status: Never   Smokeless tobacco: Never  Vaping Use   Vaping Use: Never used  Substance and Sexual Activity   Alcohol use: No   Drug use: No   Sexual activity: Not on file  Other Topics Concern   Not on file  Social History Narrative   Not on file   Social Determinants of Health   Financial Resource Strain: Not on file  Food Insecurity: Not on file  Transportation Needs: Not on file  Physical Activity: Not on file  Stress: Not on file  Social Connections: Not on file  Intimate Partner Violence: Not on file    Allergies  Allergen Reactions   Nitrofurantoin Nausea Only   Plaquenil [Hydroxychloroquine]    Tramadol Nausea And Vomiting   Sulfa Antibiotics Rash    As an infant    Family History  Problem Relation Age of Onset   Allergies Father    Asthma Father    Colon cancer Father    Allergies Brother    Asthma Brother    Breast cancer Maternal Grandmother     Prior to Admission medications   Medication Sig Start Date End Date Taking? Authorizing Provider  acetaminophen (TYLENOL) 500 MG tablet Take 500 mg by mouth every 6 (six) hours as needed for moderate pain or headache.    [provider]  albuterol (PROVENTIL) (2.5 MG/3ML) 0.083% nebulizer solution Inhale 2.5 mg into the lungs every 6 (six) hours as needed for wheezing or shortness of breath. 01/04/21 01/04/22  [provider]  aspirin EC 81 MG EC tablet Take 1 tablet (81 mg total) by mouth daily. Swallow whole. 07/29/20   Fritzi Mandes, MD  atorvastatin (LIPITOR) 40 MG tablet Take 1 tablet (40 mg total) by mouth daily. Resume once daptomycin is finished Patient not taking: Reported on 04/21/2021 03/11/21   Elgergawy, Silver Huguenin, MD  B Complex Vitamins (VITAMIN B COMPLEX PO) Take 1 tablet by mouth daily.    [provider]  budesonide-formoterol (SYMBICORT) 80-4.5 MCG/ACT inhaler Take 2 puffs first thing in am and then another 2 puffs about 12 hours later. Patient taking  differently: Inhale 2 puffs into the lungs in the morning and at bedtime. 08/13/20   Angiulli, Lavon Paganini, PA-C  ciprofloxacin (CIPRO) 500 MG tablet Take 1 tablet (500 mg total) by mouth every 12 (twelve) hours. 04/21/21   Zara Council A, PA-C  denosumab (PROLIA) 60 MG/ML SOSY injection Inject 60 mg into the skin every 6 (six) months.    [provider]  dorzolamide (TRUSOPT) 2 % ophthalmic solution Place 1 drop into both eyes 2 (two) times daily.    [provider]  ergocalciferol (VITAMIN D2) 1.25 MG (50000 UT) capsule Take 50,000 Units by mouth once a week. On tuesday 01/22/21   [provider]  escitalopram (LEXAPRO) 20 MG tablet TAKE 1 TABLET BY MOUTH EVERY DAY 03/03/21   Glean Hess, MD  folic acid (FOLVITE) 1 MG tablet Take 1 tablet (1 mg total) by mouth daily. 08/13/20   Angiulli, Lavon Paganini, PA-C  HYDROcodone-acetaminophen (NORCO) 5-325 MG tablet Take 1 tablet by mouth every 6 (six) hours as needed for moderate pain. 04/26/21   McGowan, Larene Beach A, PA-C  latanoprost (XALATAN) 0.005 % ophthalmic solution Place 1 drop into the right eye at bedtime.    [provider]  levothyroxine (SYNTHROID) 137 MCG  tablet TAKE 1 TABLET (137 MCG TOTAL) BY MOUTH DAILY BEFORE BREAKFAST. Patient taking differently: Take 137 mcg by mouth daily before breakfast. 12/08/20   Glean Hess, MD  methotrexate (RHEUMATREX) 2.5 MG tablet Take 10 tablets (25 mg total) by mouth every Tuesday. 02/23/21   Elgergawy, Silver Huguenin, MD  Multiple Vitamins-Minerals (MULTIVITAMIN WITH MINERALS) tablet Take 1 tablet by mouth daily.    [provider]  NON FORMULARY CPAP nightly    [provider]  OXYGEN Inhale 2 L into the lungs daily as needed (During walking and activity).     [provider]  potassium citrate (UROCIT-K) 10 MEQ (1080 MG) SR tablet Take 2 tablets (20 mEq total) by mouth 3 (three) times daily with meals. 11/25/20   Billey Co, MD  predniSONE (DELTASONE) 5 MG tablet Please take 40 mg oral daily x2 days, then 30 mg oral daily x2 days, then continue 15 mg oral daily after that. 02/04/21   Elgergawy, Silver Huguenin, MD  Treprostinil (TYVASO) 0.6 MG/ML SOLN Inhale 0.6 mcg into the lungs See admin instructions. 12 breaths 4 times a day    [provider]    Physical Exam: Vitals:   05/06/21 1059 05/06/21 1200 05/06/21 1400 05/06/21 1526  BP: (!) 86/52 (!) 121/53 (!) 114/97 (!) 113/56  Pulse: (!) 103 88 93 90  Resp: _0 Temp: 98.2 F (36.8 C)     TempSrc: Oral     SpO2: 99% 93% 92% 95%  Weight:         General:  Appears calm and comfortable and is in NAD Cardiovascular:  RRR, no m/r/g.  Respiratory:   CTA bilaterally with no wheezes/rales/rhonchi.  Normal respiratory effort. Abdomen:  soft, NT, ND, NABS Skin: She has a left first toe and second metatarsal head amputation with wound site looking well with no signs of acute infection.  Right great toe tip has an ulceration that looks like it is well-healing.  No purulent drainage or foul smell noted. Musculoskeletal:  grossly normal tone BUE/BLE, good ROM, no bony abnormality Lower extremity:  No LE edema.  Limited foot  exam with no ulcerations.  2+ distal pulses. Psychiatric:  grossly normal mood and affect, speech fluent  and appropriate, AOx3 Neurologic:  CN 2-12 grossly intact, moves all extremities in coordinated fashion, sensation intact    Radiological Exams on Admission: Independently reviewed - see discussion in A/P where applicable  DG Chest Portable 1 View  Result Date: 05/06/2021 CLINICAL DATA:  Syncope. EXAM: PORTABLE CHEST 1 VIEW COMPARISON:  February 02, 2021. FINDINGS: Stable cardiomediastinal silhouette. Stable bilateral lung opacities are noted most consistent with sarcoidosis. Stable left basilar scarring is noted also related to sarcoidosis. No pneumothorax is noted. Status post vertebral plasty at multiple levels in the thoracic spine. IMPRESSION: Stable chronic findings as described above. No definite acute abnormality seen. Electronically Signed   By: Marijo Conception M.D.   On: 05/06/2021 12:20   DG Foot Complete Left  Result Date: 05/06/2021 CLINICAL DATA:  Wound eval for osteomyelitis EXAM: LEFT FOOT - COMPLETE 3+ VIEW COMPARISON:  CT 02/01/2021 FINDINGS: Prior great toe amputation to the level the first metatarsal with persistent and cortical irregularity and fragmentation. Prior second toe amputation to the distal metatarsal, with continued small oblique fracture of the metatarsal neck and adjacent bony fragmentation. Prior third toe amputation with residual third metatarsal. There is no new osseous destruction of the third metatarsal head. Similar soft tissue within swelling along the amputation sites. IMPRESSION: Persistent irregularity and bony fragmentation distal aspect of the residual first and second metatarsals, with persistent soft tissue wound and swelling along the amputation sites. Findings could be related to postoperative change or osteomyelitis. No new bony destruction of the third metatarsal head. Electronically Signed   By: Maurine Simmering   On: 05/06/2021 14:16   DG Toe Great  Right  Result Date: 05/06/2021 CLINICAL DATA:  Wound, eval for osteomyelitis EXAM: RIGHT GREAT TOE COMPARISON:  None. FINDINGS: There is cortical regularity of the great toe distal tuft, with overlying soft tissue defect and swelling. IMPRESSION: Findings concerning for osteomyelitis of the great toe distal tuft. Electronically Signed   By: Maurine Simmering   On: 05/06/2021 14:17     Labs on Admission: I have personally reviewed the available labs and imaging studies at the time of the admission.  Pertinent labs: Acid 2.2, chloride 95, creatinine 1.56 BUN 29, WBC 24     Assessment/Plan: SIRS positive: The patient will be admitted to the medical/surgical unit under inpatient status.  We will start empiric antibiotics with vancomycin and cefepime.  Plain film imaging of bilateral feet is pending to look for osseous abnormalities consistent with osteomyelitis.  Continue maintenance IV fluids for the patient's lactic acidosis.  Chest x-ray shows no infiltrate or consolidation.  We will obtain a urinalysis.  Blood cultures are pending results.  Syncope and collapse: Obtain orthostatics.  Likely due to dehydration.  Echo recently done in the past few months.  Obtain carotid duplex ultrasound.  Continue monitoring on telemetry for any arrhythmia.  Acute kidney injury likely prerenal secondary to dehydration: Continue maintenance IV fluids.  Other chronic conditions: Sarcoidosis with chronic hypoxic respiratory failure on 2 L oxygen at baseline, history of osteomyelitis and history of amputation, osteoporosis, planned kyphoplasty upcoming  Level of Care: MedSurg DVT prophylaxis: Heparin subcu Code Status: Full code Consults: None Admission status: Inpatient   Leslee Home DO [facility]   How to contact the [facility] Attending or Consulting provider [place] or covering provider during after hours [place], for this patient?  Check the care team in [facility] and look for a) attending/consulting [facility]  provider listed and b) the [facility] team listed  Log into [email or web address] and use [place]'s universal password to access. If you do not have the password, please contact the hospital operator. Locate the [facility] provider you are looking for under [facility] and page to a number that you can be directly reached. If you still have difficulty reaching the provider, please page the [facility] (Director on Call) for the Hospitalists listed on amion for assistance.   05/06/2021, 3:53 PM

## 2021-05-07 ENCOUNTER — Inpatient Hospital Stay: Payer: BC Managed Care – PPO

## 2021-05-07 DIAGNOSIS — E860 Dehydration: Secondary | ICD-10-CM

## 2021-05-07 LAB — COMPREHENSIVE METABOLIC PANEL
ALT: 21 U/L (ref 0–44)
AST: 31 U/L (ref 15–41)
Albumin: 3 g/dL — ABNORMAL LOW (ref 3.5–5.0)
Alkaline Phosphatase: 60 U/L (ref 38–126)
Anion gap: 9 (ref 5–15)
BUN: 27 mg/dL — ABNORMAL HIGH (ref 8–23)
CO2: 29 mmol/L (ref 22–32)
Calcium: 9 mg/dL (ref 8.9–10.3)
Chloride: 100 mmol/L (ref 98–111)
Creatinine, Ser: 0.98 mg/dL (ref 0.44–1.00)
GFR, Estimated: >60 mL/min (ref >60)
Glucose, Bld: 112 mg/dL — ABNORMAL HIGH (ref 70–99)
Potassium: 3.8 mmol/L (ref 3.5–5.1)
Sodium: 138 mmol/L (ref 135–145)
Total Bilirubin: 0.9 mg/dL (ref 0.3–1.2)
Total Protein: 6.6 g/dL (ref 6.5–8.1)

## 2021-05-07 LAB — CBC
HCT: 33.6 % — ABNORMAL LOW (ref 36.0–46.0)
Hemoglobin: 10.8 g/dL — ABNORMAL LOW (ref 12.0–15.0)
MCH: 30.9 pg (ref 26.0–34.0)
MCHC: 32.1 g/dL (ref 30.0–36.0)
MCV: 96.3 fL (ref 80.0–100.0)
Platelets: 275 10*3/uL (ref 150–400)
RBC: 3.49 MIL/uL — ABNORMAL LOW (ref 3.87–5.11)
RDW: 14.3 % (ref 11.5–15.5)
WBC: 16.3 10*3/uL — ABNORMAL HIGH (ref 4.0–10.5)
nRBC: 0 % (ref 0.0–0.2)

## 2021-05-07 LAB — C-REACTIVE PROTEIN: CRP: 30.3 mg/dL — ABNORMAL HIGH (ref <1.0)

## 2021-05-07 LAB — SEDIMENTATION RATE: Sed Rate: 117 mm/hr — ABNORMAL HIGH (ref 0–30)

## 2021-05-07 MED ORDER — PREDNISONE 20 MG PO TABS
40.0000 mg | ORAL_TABLET | Freq: Every day | ORAL | Status: DC
  Administered 2021-05-08: 09:00:00 40 mg via ORAL
  Filled 2021-05-07: qty 2

## 2021-05-07 MED ORDER — SODIUM CHLORIDE 0.9 % IV SOLN
INTRAVENOUS | Status: DC | PRN
  Administered 2021-05-07: 18:00:00 250 mL via INTRAVENOUS

## 2021-05-07 MED ORDER — VANCOMYCIN HCL 1250 MG/250ML IV SOLN
1250.0000 mg | INTRAVENOUS | Status: DC
  Administered 2021-05-07 – 2021-05-10 (×4): 1250 mg via INTRAVENOUS
  Filled 2021-05-07 (×5): qty 250

## 2021-05-07 MED ORDER — PREDNISONE 20 MG PO TABS
20.0000 mg | ORAL_TABLET | Freq: Once | ORAL | Status: AC
  Administered 2021-05-07: 20 mg via ORAL
  Filled 2021-05-07: qty 1

## 2021-05-07 MED ORDER — CYCLOBENZAPRINE HCL 10 MG PO TABS
5.0000 mg | ORAL_TABLET | Freq: Three times a day (TID) | ORAL | Status: DC
  Administered 2021-05-07 – 2021-05-11 (×13): 5 mg via ORAL
  Filled 2021-05-07 (×13): qty 1

## 2021-05-07 NOTE — Evaluation (Signed)
Physical Therapy Evaluation Patient Details Name: Stacie [name] MRN: [id number] DOB: [birth year] Today's Date: 05/07/2021   History of Present Illness  Pt is a 65 y/o F admitted on 05/06/21 after presenting with c/o 2 syncopal episodes. Pt was found to be hypotensive. Of note, pt has planned kyphoplasty next week. PMH: pulmonary sarcoidosis on chronic prednisone, methotrexate, ILD, chronic respiratory failure on 2-3L home O2, pulmonary HTN, CKD stage 3, CVA, hypothyroidism, nephrolithiasis, hx L foot ganagrene 2/p ray & 2nd metatarsal ahead amputation on 3/22, OSA on CPAP  Clinical Impression  Pt seen for PT evaluation with pt agreeable to tx. Pt negative for orthostatic hypotension. Pt able to complete bed mobility with supervision & transfers with CGA without AD. Further gait deferred 2/2 drainage on floor from L foot - nurse made aware. (Pt reports no restrictions re: weight bearing in either LE). Will continue to follow pt acutely to progress mobility as able.   BP checked in LUE: Supine: 146/78 mmHg (MAP 100) Sitting: 158/73 mmHg (MAP 102) Standing at 0: 158/70 mmHg (MAP 97)     Follow Up Recommendations Home health PT;Supervision/Assistance - 24 hour    Equipment Recommendations  None recommended by PT    Recommendations for Other Services       Precautions / Restrictions Precautions Precautions: Fall Restrictions Weight Bearing Restrictions: No Other Position/Activity Restrictions: pt reports no restrictions following LLE transmet amputation in March 2022 and R toe amputation      Mobility  Bed Mobility Overal bed mobility: Needs Assistance Bed Mobility: Supine to Sit     Supine to sit: Supervision;HOB elevated     General bed mobility comments: use of bed rails    Transfers Overall transfer level: Needs assistance Equipment used: None Transfers: Sit to/from [facility] Sit to Stand: Min guard Stand pivot transfers: Min guard           Ambulation/Gait                Stairs            Wheelchair Mobility    Modified Rankin (Stroke Patients Only)       Balance Overall balance assessment: Needs assistance Sitting-balance support: Bilateral upper extremity supported;Feet supported Sitting balance-Leahy Scale: Good     Standing balance support: No upper extremity supported;During functional activity Standing balance-Leahy Scale: Fair                               Pertinent Vitals/Pain Pain Assessment: No/denies pain    Home Living Family/patient expects to be discharged to:: Private residence Living Arrangements: Spouse/significant other Available Help at Discharge: Family;Available PRN/intermittently (pt reports her friends & family can provide 24 hr supervision) Type of Home: House Home Access: Stairs to enter Entrance Stairs-Rails: Psychiatric nurse of Steps: 2-3 Home Layout: Two level;1/2 bath on main level Home Equipment: Walker - 2 wheels;Shower seat;Hand held shower head;Wheelchair - manual      Prior Function           Comments: Pt denies falls in the past 6 months, reports she's ambulatory in the home without AD but uses w/c for community mobility.     Hand Dominance        Extremity/Trunk Assessment   Upper Extremity Assessment Upper Extremity Assessment: Overall WFL for tasks assessed    Lower Extremity Assessment Lower Extremity Assessment: Generalized weakness (wounds to BLE feet)  Communication   Communication: No difficulties  Cognition Arousal/Alertness: Awake/alert Behavior During Therapy: WFL for tasks assessed/performed Overall Cognitive Status: Within Functional Limits for tasks assessed                                        General Comments General comments (skin integrity, edema, etc.): Pt on 3L/min via nasal cannula, lowest SpO2 was 88% but able to recover to >90%. After transferring to chair pt  noted to have drainage on floor from L foot so further gait deferred & nurse notified.    Exercises     Assessment/Plan    PT Assessment Patient needs continued PT services  PT Problem List Decreased strength;Decreased mobility;Decreased balance;Decreased knowledge of use of DME;Decreased skin integrity;Decreased activity tolerance       PT Treatment Interventions DME instruction;Therapeutic activities;Modalities;Gait training;Therapeutic exercise;Patient/family education;Stair training;Balance training;Functional mobility training;Neuromuscular re-education;Manual techniques;Wheelchair mobility training    PT Goals (Current goals can be found in the Care Plan section)  Acute Rehab PT Goals Patient Stated Goal: go home PT Goal Formulation: With patient Time For Goal Achievement: 05/21/21 Potential to Achieve Goals: Good    Frequency Min 2X/week   Barriers to discharge Inaccessible home environment steps to enter home    Co-evaluation               AM-PAC PT "6 Clicks" Mobility  Outcome Measure Help needed turning from your back to your side while in a flat bed without using bedrails?: None Help needed moving from lying on your back to sitting on the side of a flat bed without using bedrails?: A Little Help needed moving to and from a bed to a chair (including a wheelchair)?: A Little Help needed standing up from a chair using your arms (e.g., wheelchair or bedside chair)?: A Little Help needed to walk in hospital room?: A Little Help needed climbing 3-5 steps with a railing? : A Little 6 Click Score: 19    End of Session Equipment Utilized During Treatment: Oxygen Activity Tolerance: Patient tolerated treatment well Patient left: in chair;with chair alarm set;with call bell/phone within reach Nurse Communication: Mobility status (L foot drainage) PT Visit Diagnosis: Difficulty in walking, not elsewhere classified (R26.2);Muscle weakness (generalized) (M62.81)     Time: 4961-1643 PT Time Calculation (min) (ACUTE ONLY): 22 min   Charges:   PT Evaluation $PT Eval Moderate Complexity: [place], PT, DPT 05/07/21, 3:59 PM   Waunita Schooner 05/07/2021, 3:57 PM

## 2021-05-07 NOTE — Plan of Care (Signed)
Pt Stacie [name], remains on chronic 2L [place], VSS. MRI completed overnight. C/o pain addressed with PRN per MAR. Turns self in bed. Adequate UO, no BM this shift. Fall/safety precautions in place, rounding performed.   Problem: Education: Goal: Knowledge of General Education information will improve Description: Including pain rating scale, medication(s)/side effects and non-pharmacologic comfort measures 05/07/2021 0430 by Jocelyn Lamer, RN Outcome: Progressing 05/06/2021 2327 by Jocelyn Lamer, RN Outcome: Progressing   Problem: Health Behavior/Discharge Planning: Goal: Ability to manage health-related needs will improve 05/07/2021 0430 by Jocelyn Lamer, RN Outcome: Progressing 05/06/2021 2327 by Jocelyn Lamer, RN Outcome: Progressing   Problem: Clinical Measurements: Goal: Ability to maintain clinical measurements within normal limits will improve 05/07/2021 0430 by Jocelyn Lamer, RN Outcome: Progressing 05/06/2021 2327 by Jocelyn Lamer, RN Outcome: Progressing Goal: Will remain free from infection 05/07/2021 0430 by Jocelyn Lamer, RN Outcome: Progressing 05/06/2021 2327 by Jocelyn Lamer, RN Outcome: Progressing Goal: Diagnostic test results will improve 05/07/2021 0430 by Jocelyn Lamer, RN Outcome: Progressing 05/06/2021 2327 by Jocelyn Lamer, RN Outcome: Progressing Goal: Respiratory complications will improve 05/07/2021 0430 by Jocelyn Lamer, RN Outcome: Progressing 05/06/2021 2327 by Jocelyn Lamer, RN Outcome: Progressing Goal: Cardiovascular complication will be avoided 05/07/2021 0430 by Jocelyn Lamer, RN Outcome: Progressing 05/06/2021 2327 by Jocelyn Lamer, RN Outcome: Progressing   Problem: Activity: Goal: Risk for activity intolerance will decrease 05/07/2021 0430 by Jocelyn Lamer, RN Outcome: Progressing 05/06/2021 2327 by Jocelyn Lamer, RN Outcome: Progressing   Problem: Nutrition: Goal: Adequate nutrition will be maintained 05/07/2021 0430 by  Jocelyn Lamer, RN Outcome: Progressing 05/06/2021 2327 by Jocelyn Lamer, RN Outcome: Progressing   Problem: Coping: Goal: Level of anxiety will decrease 05/07/2021 0430 by Jocelyn Lamer, RN Outcome: Progressing 05/06/2021 2327 by Jocelyn Lamer, RN Outcome: Progressing   Problem: Elimination: Goal: Will not experience complications related to bowel motility 05/07/2021 0430 by Jocelyn Lamer, RN Outcome: Progressing 05/06/2021 2327 by Jocelyn Lamer, RN Outcome: Progressing Goal: Will not experience complications related to urinary retention 05/07/2021 0430 by Jocelyn Lamer, RN Outcome: Progressing 05/06/2021 2327 by Jocelyn Lamer, RN Outcome: Progressing   Problem: Pain Managment: Goal: General experience of comfort will improve 05/07/2021 0430 by Jocelyn Lamer, RN Outcome: Progressing 05/06/2021 2327 by Jocelyn Lamer, RN Outcome: Progressing   Problem: Safety: Goal: Ability to remain free from injury will improve 05/07/2021 0430 by Jocelyn Lamer, RN Outcome: Progressing 05/06/2021 2327 by Jocelyn Lamer, RN Outcome: Progressing   Problem: Skin Integrity: Goal: Risk for impaired skin integrity will decrease 05/07/2021 0430 by Jocelyn Lamer, RN Outcome: Progressing 05/06/2021 2327 by Jocelyn Lamer, RN Outcome: Progressing

## 2021-05-07 NOTE — TOC Initial Note (Signed)
Transition of Care [facility]) - Initial/Assessment Note    Patient Details  Name: Stacie [name] MRN: [id number] Date of Birth: 1955/11/23  Transition of Care [facility]) CM/SW Contact:    Shelbie Hutching, RN Phone Number: 05/07/2021, 4:21 PM  Clinical Narrative:                 Patient admitted to the hospital with syncope and collapse, dehydration, history of sarcoidosis on chronic O2 at 2L through [employer].  RNCM met with patient at the bedside, she is from home with her husband and she is able to walk and get up and down the steps at home, when she is feeling well she does a lot of cooking.  Patient has all needed DME at home, she has had home health in the past but does not think she will need home health at discharge this time.  Patient has no issues obtaining or paying for medication.   TOC will follow through discharge and assist as needed.    Expected Discharge Plan: Home/Self Care Barriers to Discharge: Continued Medical Work up   Patient Goals and CMS Choice Patient states their goals for this hospitalization and ongoing recovery are:: wants to get stronger and back to some simbolence of normalcy      Expected Discharge Plan and Services Expected Discharge Plan: Home/Self Care   Discharge Planning Services: CM Consult   Living arrangements for the past 2 months: Single Family Home                 DME Arranged: N/A DME Agency: NA       HH Arranged: Refused [place] Agency: NA        Prior Living Arrangements/Services Living arrangements for the past 2 months: Single Family Home Lives with:: Spouse Patient language and need for interpreter reviewed:: Yes Do you feel safe going back to the place where you live?: Yes      Need for Family Participation in Patient Care: Yes (Comment) Care giver support system in place?: Yes (comment) Current home services: DME Criminal Activity/Legal Involvement Pertinent to Current Situation/Hospitalization: No - Comment as  needed  Activities of Daily Living Home Assistive Devices/Equipment: Walker (specify type) ADL Screening (condition at time of admission) Patient's cognitive ability adequate to safely complete daily activities?: Yes Is the patient deaf or have difficulty hearing?: No Does the patient have difficulty seeing, even when wearing glasses/contacts?: Yes Does the patient have difficulty concentrating, remembering, or making decisions?: No Patient able to express need for assistance with ADLs?: Yes Does the patient have difficulty dressing or bathing?: Yes Independently performs ADLs?: Yes (appropriate for developmental age) Does the patient have difficulty walking or climbing stairs?: Yes Weakness of Legs: None Weakness of Arms/Hands: None  Permission Sought/Granted Permission sought to share information with : Case Manager, Family Supports Permission granted to share information with : Yes, Verbal Permission Granted  Share Information with NAME: Eddie Dibbles     Permission granted to share info w Relationship: spouce     Emotional Assessment Appearance:: Appears stated age Attitude/Demeanor/Rapport: Engaged Affect (typically observed): Accepting Orientation: : Oriented to Self, Oriented to Place, Oriented to  Time, Oriented to Situation Alcohol / Substance Use: Not Applicable Psych Involvement: No (comment)  Admission diagnosis:  Syncope and collapse [R55] Dehydration [E86.0] Patient Active Problem List   Diagnosis Date Noted   Syncope and collapse 05/06/2021   Acquired absence of left great toe ([place]) 02/15/2021   Acquired absence of other left toe(s) ([place])  02/15/2021   Acute on chronic respiratory failure with hypoxia ([place]) 01/31/2021   History of CVA (cerebrovascular accident) 01/31/2021   Gangrene of left foot ([place]) 11/03/2020   Labile blood glucose    Steroid-induced hyperglycemia    Thrombocytopenia (HCC)    Acute blood loss anemia    Slow transit constipation    AKI (acute  kidney injury) ([place])    Supplemental oxygen dependent    Klebsiella pneumoniae sepsis [facility])    Critical lower limb ischemia (HCC)    Acute renal failure ([place])    Cerebrovascular accident (CVA) due to embolism of cerebral artery ([place])    Sepsis ([place]) 07/10/2020   Pulmonary hypertension ([place]) 12/18/2018   Depression, major, recurrent, in partial remission ([place]) 10/22/2018   Compression fx, thoracic spine ([place]) 12/04/2017   Hyperlipidemia, mixed 04/21/2017   Cough variant asthma  vs UACS  02/20/2017   Hypothyroidism, acquired 03/27/2015   Essential hypertension 03/27/2015   Acquired absence of both cervix and uterus 03/27/2015   H/O renal calculi 03/27/2015   Allergic rhinitis 01/07/2015   Obstructive sleep apnea 09/29/2014   Other malaise and fatigue 06/03/2014   Asthma, chronic 02/24/2014   Cough 04/30/2013   Pulmonary sarcoidosis ([place]) 03/12/2013   CKD (chronic kidney disease) stage 3, GFR 30-59 ml/min (HCC) 03/12/2013   Calcium blood increased 05/23/2012   PCP:  Glean Hess, MD Pharmacy:   [id number] [id number]: [phone]: [phone] [facility] [place] [address]Phone: [phone]: [phone]    Social Determinants of Health (SDOH) Interventions    Readmission Risk Interventions Readmission Risk Prevention Plan 05/07/2021 07/17/2020  Transportation Screening Complete Complete  PCP or Specialist Appt within 3-5 Days Complete Complete  HRI or Home Care Consult Complete Complete  Social Work Consult for [place]/Counseling Complete -  Palliative Care Screening Complete Complete  Medication Review (Press photographer Complete Complete  Some recent data might be hidden

## 2021-05-07 NOTE — Progress Notes (Signed)
PROGRESS NOTE    Stacie [name]  ZDG:[id number] DOB: [birth year] DOA: 05/06/2021 PCP: Glean Hess, MD  Brief Narrative:Stacie [name] is a chronically ill female with history of pulmonary sarcoidosis on chronic prednisone, methotrexate, ILD, chronic respiratory failure on 2 to 3 L home O2, pulmonary hypertension, CKD stage III, history of CVA, hypothyroidism, nephrolithiasis, history of left foot gangrene status post ray and second metatarsal head amputation in 3/22, she has chronic wounds that are dressed daily by her spouse OSA on CPAP presented to the ED for evaluation after 2 syncopal episodes, she has been having low back pain for the last 1 month, worse in the last week.  Denies any fevers or chills, was due to have an MRI today for planned kyphoplasty next week.  Had 2 witnessed syncopal episodes on her bed yesterday where she lost consciousness, subsequently brought to the ED.  -She was noted to be hypotensive, creatinine was mildly elevated at 1.5 and white count was 24K   Assessment & Plan:   Syncope Hypotension -Syncopal event secondary to hypotension -Blood pressure appears to have stabilized after IV fluids, KVO now -Etiology likely dehydration, meds, vasodilators, cannot rule out infectious process yet -Has severe leukocytosis on admission, follow-up blood cultures, on empiric antibiotics for chronic foot wounds -Will double prednisone dose for 3 days, suspect secondary AI -Monitor on telemetry  Chronic foot wounds/leukocytosis -Left foot underwent first toe amputation and resection of part of the first and second metatarsal head in March 22 -X-rays noted cortical irregularity, raising concern for chronic osteomyelitis, in the setting of worsening leukocytosis will ask Dr. Lucky Cowboy to evaluate, has good peripheral pulses -ESR and CRP -Completed 6-week course of IV ceftriaxone and daptomycin on 5/24 for bacteremia, osteomyelitis  Pulmonary sarcoidosis/interstitial lung  disease Pulmonary hypertension Chronic respiratory failure Chronic immunosuppression -On 2 to 3 L home O2 at baseline -On baseline prednisone 20 mg daily which is doubled, holding methotrexate until infectious process ruled out -Continue Symbicort, Tyvaso (treprostinil), albuterol PRN  Subacute low back pain L2-L3-L4 compression fractures -L2 appears more subacute -Was seen Dr. Rudene Christians for this prior to admission, was due to have MRI this was completed yesterday -Plan for kyphoplasty possibly Monday if stable/no infection  Acute kidney injury -Secondary to dehydration, hypotension, resolved  DVT prophylaxis: Heparin subcutaneous Code Status: Full code Family Communication: Spouse Dr. Margaretha Sheffield at bedside Disposition Plan:  Status is: Inpatient  Remains inpatient appropriate because:Inpatient level of care appropriate due to severity of illness  Dispo: The patient is from: Home              Anticipated d/c is to: Home              Patient currently is not medically stable to d/c.   Difficult to place patient No        Consultants:  Vascular surgery Dr. Lucky Cowboy  Procedures:   Antimicrobials:    Subjective: -Continues to have low back pain  Objective: Vitals:   05/07/21 0100 05/07/21 0422 05/07/21 0831 05/07/21 0950  BP: (!) 157/90 138/61 134/69   Pulse:  90 94   Resp: (!) 21 18 (!) 22   Temp: 98 F (36.7 C) 98 F (36.7 C) 98.8 F (37.1 C)   TempSrc: Oral     SpO2: 92% 92% 93% 91%  Weight:      Height:        Intake/Output Summary (Last 24 hours) at 05/07/2021 1052 Last data filed at 05/07/2021 1033 Gross per 24  hour  Intake 549.56 ml  Output 2600 ml  Net -2050.44 ml   Filed Weights   05/06/21 1053  Weight: 76.2 kg    Examination:  General exam: Pleasant chronically ill female, laying in bed, AAOx3 CVS: S1-S2, regular rate rhythm Lungs: Poor air movement bilaterally, left basilar wheeze noted Abdomen: Soft, nontender, bowel sounds  present Extremities: Status post right great toe tip amputation with an ulcer, mild purulence, no erythema tenderness etc. left first toe and second metatarsal head amputation wound site with scant purulence, no surrounding erythema or tenderness Skin: As above Psychiatry: Mood & affect appropriate.     Data Reviewed:   CBC: Recent Labs  Lab 05/06/21 1119 05/07/21 0452  WBC 24.7* 16.3*  HGB 11.8* 10.8*  HCT 36.6 33.6*  MCV 95.6 96.3  PLT 295 474   Basic Metabolic Panel: Recent Labs  Lab 05/06/21 1119 05/07/21 0452  NA 137 138  K 3.5 3.8  CL 95* 100  CO2 29 29  GLUCOSE 124* 112*  BUN 29* 27*  CREATININE 1.56* 0.98  CALCIUM 8.9 9.0   GFR: Estimated Creatinine Clearance: 60.1 mL/min (by C-G formula based on SCr of 0.98 mg/dL). Liver Function Tests: Recent Labs  Lab 05/06/21 1119 05/07/21 0452  AST 27 31  ALT 22 21  ALKPHOS 67 60  BILITOT 1.1 0.9  PROT 6.9 6.6  ALBUMIN 3.1* 3.0*   Recent Labs  Lab 05/06/21 1119  LIPASE 21   No results for input(s): AMMONIA in the last 168 hours. Coagulation Profile: No results for input(s): INR, PROTIME in the last 168 hours. Cardiac Enzymes: No results for input(s): CKTOTAL, CKMB, CKMBINDEX, TROPONINI in the last 168 hours. BNP (last 3 results) No results for input(s): PROBNP in the last 8760 hours. HbA1C: No results for input(s): HGBA1C in the last 72 hours. CBG: No results for input(s): GLUCAP in the last 168 hours. Lipid Profile: No results for input(s): CHOL, HDL, LDLCALC, TRIG, CHOLHDL, LDLDIRECT in the last 72 hours. Thyroid Function Tests: No results for input(s): TSH, T4TOTAL, FREET4, T3FREE, THYROIDAB in the last 72 hours. Anemia Panel: No results for input(s): VITAMINB12, FOLATE, FERRITIN, TIBC, IRON, RETICCTPCT in the last 72 hours. Urine analysis:    Component Value Date/Time   COLORURINE STRAW (A) 05/06/2021 1616   APPEARANCEUR CLEAR (A) 05/06/2021 1616   APPEARANCEUR Clear 04/21/2021 1551    LABSPEC 1.010 05/06/2021 1616   PHURINE 8.0 05/06/2021 1616   GLUCOSEU NEGATIVE 05/06/2021 1616   HGBUR SMALL (A) 05/06/2021 1616   BILIRUBINUR NEGATIVE 05/06/2021 1616   BILIRUBINUR Negative 04/21/2021 1551   KETONESUR NEGATIVE 05/06/2021 1616   PROTEINUR NEGATIVE 05/06/2021 1616   UROBILINOGEN 0.2 01/07/2020 1406   UROBILINOGEN 0.2 11/25/2014 1849   NITRITE NEGATIVE 05/06/2021 1616   LEUKOCYTESUR NEGATIVE 05/06/2021 1616   Sepsis Labs: _0 (procalcitonin:4,lacticidven:4)  ) Recent Results (from the past 240 hour(s))  Resp Panel by RT-PCR (Flu A&B, Covid) Nasopharyngeal Swab     Status: None   Collection Time: 05/06/21  2:03 PM   Specimen: Nasopharyngeal Swab; Nasopharyngeal(NP) swabs in vial transport medium  Result Value Ref Range Status   SARS Coronavirus 2 by RT PCR NEGATIVE NEGATIVE Final    Comment: (NOTE) SARS-CoV-2 target nucleic acids are NOT DETECTED.  The SARS-CoV-2 RNA is generally detectable in upper respiratory specimens during the acute phase of infection. The lowest concentration of SARS-CoV-2 viral copies this assay can detect is 138 copies/mL. A negative result does not preclude SARS-Cov-2 infection and should not be used  as the sole basis for treatment or other patient management decisions. A negative result may occur with  improper specimen collection/handling, submission of specimen other than nasopharyngeal swab, presence of viral mutation(s) within the areas targeted by this assay, and inadequate number of viral copies(<138 copies/mL). A negative result must be combined with clinical observations, patient history, and epidemiological information. The expected result is Negative.  Fact Sheet for Patients:  EntrepreneurPulse.com.au  Fact Sheet for Healthcare Providers:  IncredibleEmployment.be  This test is no t yet approved or cleared by the [place] FDA and  has been authorized for detection and/or  diagnosis of SARS-CoV-2 by FDA under an Emergency Use Authorization (EUA). This EUA will remain  in effect (meaning this test can be used) for the duration of the COVID-19 declaration under Section 564(b)(1) of the Act, 21 U.S.C.section 360bbb-3(b)(1), unless the authorization is terminated  or revoked sooner.       Influenza A by PCR NEGATIVE NEGATIVE Final   Influenza B by PCR NEGATIVE NEGATIVE Final    Comment: (NOTE) The Xpert Xpress SARS-CoV-2/FLU/RSV plus assay is intended as an aid in the diagnosis of influenza from Nasopharyngeal swab specimens and should not be used as a sole basis for treatment. Nasal washings and aspirates are unacceptable for Xpert Xpress SARS-CoV-2/FLU/RSV testing.  Fact Sheet for Patients: EntrepreneurPulse.com.au  Fact Sheet for Healthcare Providers: IncredibleEmployment.be  This test is not yet approved or cleared by the [place] FDA and has been authorized for detection and/or diagnosis of SARS-CoV-2 by FDA under an Emergency Use Authorization (EUA). This EUA will remain in effect (meaning this test can be used) for the duration of the COVID-19 declaration under Section 564(b)(1) of the Act, 21 U.S.C. section 360bbb-3(b)(1), unless the authorization is terminated or revoked.  Performed at [facility], [address] [place], [place] [address]   Blood culture (routine x 2)     Status: None (Preliminary result)   Collection Time: 05/06/21  2:12 PM   Specimen: BLOOD  Result Value Ref Range Status   Specimen Description BLOOD BLOOD RIGHT ARM  Final   Special Requests   Final    BOTTLES DRAWN AEROBIC AND ANAEROBIC Blood Culture adequate volume   Culture   Final    NO GROWTH < 24 HOURS Performed at [facility], [address], [place], [place] [address]    Report Status PENDING  Incomplete  Blood culture (routine x 2)     Status: None (Preliminary result)   Collection Time:  05/06/21  2:12 PM   Specimen: BLOOD  Result Value Ref Range Status   Specimen Description BLOOD LEFT ANTECUBITAL  Final   Special Requests   Final    BOTTLES DRAWN AEROBIC AND ANAEROBIC Blood Culture results may not be optimal due to an inadequate volume of blood received in culture bottles   Culture   Final    NO GROWTH < 24 HOURS Performed at [facility], [address] [place], [place] [address]    Report Status PENDING  Incomplete         Radiology Studies: MR LUMBAR SPINE WO CONTRAST  Result Date: 05/07/2021 CLINICAL DATA:  Low back pain, increased fracture risk EXAM: MRI LUMBAR SPINE WITHOUT CONTRAST TECHNIQUE: Multiplanar, multisequence MR imaging of the lumbar spine was performed. No intravenous contrast was administered. COMPARISON:  None. FINDINGS: Segmentation:  Standard Alignment:  Grade 1 retrolisthesis at L5-S1 Vertebrae: There is a chronic compression deformity of T12 approximately 25% height loss. There is  a recent compression fracture of L2 with mild edema underlying the superior endplate. There is approximately 25% height loss. No retropulsion. There are age-indeterminate compression deformities of L3 and L4 with approximately 10% height loss at L3 and 25% height loss at L4. Conus medullaris and cauda equina: Conus extends to the L1 level. Conus and cauda equina appear normal. Paraspinal and other soft tissues: Negative. Disc levels: L1-L2: Normal disc space and facet joints. No spinal canal stenosis. No neural foraminal stenosis. L2-L3: Normal disc space and facet joints. No spinal canal stenosis. No neural foraminal stenosis. L3-L4: Mild disc bulge and moderate facet hypertrophy. No spinal canal stenosis. No neural foraminal stenosis. L4-L5: Small left asymmetric disc bulge. Left lateral recess narrowing without central spinal canal stenosis. No neural foraminal stenosis. L5-S1: Small central disc protrusion with mild facet hypertrophy. No spinal canal stenosis. No  neural foraminal stenosis. Visualized sacrum: Normal. IMPRESSION: 1. Acute or subacute compression fracture of L2 with approximately 25% height loss. No retropulsion. 2. Age-indeterminate compression deformities of L3 and L4 with approximately 10-25% height loss. 3. No central spinal canal or neural foraminal stenosis. 4. Chronic T12 compression deformity. Electronically Signed   By: Ulyses Jarred M.D.   On: 05/07/2021 02:47   US Carotid Bilateral  Result Date: 05/06/2021 CLINICAL DATA:  Syncope, collapse, history of TIA EXAM: BILATERAL CAROTID DUPLEX ULTRASOUND TECHNIQUE: Pearline Cables scale imaging, color Doppler and duplex ultrasound were performed of bilateral carotid and vertebral arteries in the neck. COMPARISON:  07/18/2020 FINDINGS: Criteria: Quantification of carotid stenosis is based on velocity parameters that correlate the residual internal carotid diameter with NASCET-based stenosis levels, using the diameter of the distal internal carotid lumen as the denominator for stenosis measurement. The following velocity measurements were obtained: RIGHT ICA: 110/24 cm/sec CCA: 00/92 cm/sec SYSTOLIC ICA/CCA RATIO:  1.4 ECA: 139 cm/sec LEFT ICA: 83/14 cm/sec CCA: 330/07 cm/sec SYSTOLIC ICA/CCA RATIO:  0.8 ECA: 105 cm/sec RIGHT CAROTID ARTERY: Mild calcified atheromatous plaque at the carotid bifurcation. No significant stenosis by grayscale imaging. RIGHT VERTEBRAL ARTERY:  Antegrade. LEFT CAROTID ARTERY: Minimal atheromatous plaque within the proximal left internal carotid artery. No significant stenosis by grayscale imaging. LEFT VERTEBRAL ARTERY:  Antegrade. IMPRESSION: 1. No significant flow limiting stenosis within either carotid artery. Estimated 0-49% stenosis bilaterally. Electronically Signed   By: Randa Ngo M.D.   On: 05/06/2021 17:30   DG Chest Portable 1 View  Result Date: 05/06/2021 CLINICAL DATA:  Syncope. EXAM: PORTABLE CHEST 1 VIEW COMPARISON:  February 02, 2021. FINDINGS: Stable  cardiomediastinal silhouette. Stable bilateral lung opacities are noted most consistent with sarcoidosis. Stable left basilar scarring is noted also related to sarcoidosis. No pneumothorax is noted. Status post vertebral plasty at multiple levels in the thoracic spine. IMPRESSION: Stable chronic findings as described above. No definite acute abnormality seen. Electronically Signed   By: Marijo Conception M.D.   On: 05/06/2021 12:20   DG Foot Complete Left  Result Date: 05/06/2021 CLINICAL DATA:  Wound eval for osteomyelitis EXAM: LEFT FOOT - COMPLETE 3+ VIEW COMPARISON:  CT 02/01/2021 FINDINGS: Prior great toe amputation to the level the first metatarsal with persistent and cortical irregularity and fragmentation. Prior second toe amputation to the distal metatarsal, with continued small oblique fracture of the metatarsal neck and adjacent bony fragmentation. Prior third toe amputation with residual third metatarsal. There is no new osseous destruction of the third metatarsal head. Similar soft tissue within swelling along the amputation sites. IMPRESSION: Persistent irregularity and bony fragmentation distal aspect of  the residual first and second metatarsals, with persistent soft tissue wound and swelling along the amputation sites. Findings could be related to postoperative change or osteomyelitis. No new bony destruction of the third metatarsal head. Electronically Signed   By: Maurine Simmering   On: 05/06/2021 14:16   DG Toe Great Right  Result Date: 05/06/2021 CLINICAL DATA:  Wound, eval for osteomyelitis EXAM: RIGHT GREAT TOE COMPARISON:  None. FINDINGS: There is cortical regularity of the great toe distal tuft, with overlying soft tissue defect and swelling. IMPRESSION: Findings concerning for osteomyelitis of the great toe distal tuft. Electronically Signed   By: Maurine Simmering   On: 05/06/2021 14:17        Scheduled Meds:  aspirin EC  81 mg Oral Daily   B-complex with vitamin C  1 tablet Oral Daily    dorzolamide  1 drop Both Eyes BID   escitalopram  20 mg Oral Daily   fluticasone furoate-vilanterol  1 puff Inhalation Daily   folic acid  1 mg Oral Daily   heparin  5,000 Units Subcutaneous Q8H   latanoprost  1 drop Right Eye QHS   levothyroxine  137 mcg Oral QAC breakfast   multivitamin with minerals  1 tablet Oral Daily   potassium citrate  20 mEq Oral TID WC   predniSONE  20 mg Oral Once   [START ON 05/08/2021] predniSONE  40 mg Oral Q breakfast   Treprostinil  72 mcg Inhalation QID   Continuous Infusions:  ceFEPime (MAXIPIME) IV 2 g (05/07/21 0935)   vancomycin       LOS: 1 day    Time spent: 76mn   PDomenic Polite MD [facility]   05/07/2021, 10:52 AM

## 2021-05-07 NOTE — Progress Notes (Addendum)
Patient was to have outpatient MRI yesterday for multiple compression fractures and was performed last evening.  It shows L2-3 and 4 compression fractures that all have edema on the STIR images.  Recommendation is for kyphoplasty next week if she is medically stable.  Discussed findings with patient this afternoon.  If she is here until Monday we could do kyphoplasty then otherwise she can be done as an outpatient Monday or Tuesday.  We will continue to follow her while she is here

## 2021-05-07 NOTE — Progress Notes (Signed)
Pharmacy Antibiotic Note  Stacie [name] is a 65 y.o. female admitted on 05/06/2021 with sepsis. X-ray of the right great toe concerning for osteomyelitis. Pharmacy has been consulted for Vancomycin and Cefepime dosing.  Plan: Vancomycin 1750 mg loading dose given in ED.  Will increase maintenance dose to 1250 mg IV q24h Goal AUC 400-550  Est AUC: 455.9 Est Cmax: 32.6 Est Cmin: 10.6 Calculated with SCr 0.98  Continue cefepime 2gm IV q12h (borderline CrCl)  Monitor clinical picture, renal function, and vancomycin levels at steady state F/U C&S, abx deescalation / LOT   Height: 5' 7.01" (170.2 cm) Weight: 76.2 kg (167 lb 15.9 oz) IBW/kg (Calculated) : 61.62  Temp (24hrs), Avg:98.4 F (36.9 C), Min:98 F (36.7 C), Max:99.3 F (37.4 C)  Recent Labs  Lab 05/06/21 1119 05/07/21 0452  WBC 24.7* 16.3*  CREATININE 1.56* 0.98  LATICACIDVEN 2.2*  --      Estimated Creatinine Clearance: 60.1 mL/min (by C-G formula based on SCr of 0.98 mg/dL).    Allergies  Allergen Reactions   Nitrofurantoin Nausea Only   Plaquenil [Hydroxychloroquine]    Tramadol Nausea And Vomiting   Sulfa Antibiotics Rash    As an infant    Antimicrobials this admission: Metronidazole x 1 dose Vancomycin  7/21 >>   Cefepime 7/21 >>    Dose adjustments this admission:   Microbiology results: 7/21 BCx: NG<24h  Thank you for allowing pharmacy to be a part of this patient's care.  Darnelle Bos, PharmD 05/07/2021 7:46 AM

## 2021-05-08 ENCOUNTER — Inpatient Hospital Stay: Payer: BC Managed Care – PPO

## 2021-05-08 LAB — COMPREHENSIVE METABOLIC PANEL
ALT: 23 U/L (ref 0–44)
AST: 26 U/L (ref 15–41)
Albumin: 2.7 g/dL — ABNORMAL LOW (ref 3.5–5.0)
Alkaline Phosphatase: 73 U/L (ref 38–126)
Anion gap: 6 (ref 5–15)
BUN: 27 mg/dL — ABNORMAL HIGH (ref 8–23)
CO2: 33 mmol/L — ABNORMAL HIGH (ref 22–32)
Calcium: 9.4 mg/dL (ref 8.9–10.3)
Chloride: 102 mmol/L (ref 98–111)
Creatinine, Ser: 1.1 mg/dL — ABNORMAL HIGH (ref 0.44–1.00)
GFR, Estimated: 55 mL/min — ABNORMAL LOW (ref >60)
Glucose, Bld: 122 mg/dL — ABNORMAL HIGH (ref 70–99)
Potassium: 5 mmol/L (ref 3.5–5.1)
Sodium: 141 mmol/L (ref 135–145)
Total Bilirubin: 0.8 mg/dL (ref 0.3–1.2)
Total Protein: 6.6 g/dL (ref 6.5–8.1)

## 2021-05-08 LAB — CBC
HCT: 34.7 % — ABNORMAL LOW (ref 36.0–46.0)
Hemoglobin: 11.3 g/dL — ABNORMAL LOW (ref 12.0–15.0)
MCH: 31.3 pg (ref 26.0–34.0)
MCHC: 32.6 g/dL (ref 30.0–36.0)
MCV: 96.1 fL (ref 80.0–100.0)
Platelets: 296 10*3/uL (ref 150–400)
RBC: 3.61 MIL/uL — ABNORMAL LOW (ref 3.87–5.11)
RDW: 13.9 % (ref 11.5–15.5)
WBC: 14.3 10*3/uL — ABNORMAL HIGH (ref 4.0–10.5)
nRBC: 0 % (ref 0.0–0.2)

## 2021-05-08 MED ORDER — GADOBUTROL 1 MMOL/ML IV SOLN
7.5000 mL | Freq: Once | INTRAVENOUS | Status: AC | PRN
  Administered 2021-05-08: 7.5 mL via INTRAVENOUS

## 2021-05-08 MED ORDER — PREDNISONE 20 MG PO TABS
20.0000 mg | ORAL_TABLET | Freq: Every day | ORAL | Status: DC
  Administered 2021-05-09 – 2021-05-11 (×3): 20 mg via ORAL
  Filled 2021-05-08 (×3): qty 1

## 2021-05-08 MED ORDER — FUROSEMIDE 10 MG/ML IJ SOLN
20.0000 mg | Freq: Once | INTRAMUSCULAR | Status: AC
  Administered 2021-05-08: 10:00:00 20 mg via INTRAVENOUS
  Filled 2021-05-08: qty 2

## 2021-05-08 MED ORDER — SENNOSIDES-DOCUSATE SODIUM 8.6-50 MG PO TABS
1.0000 | ORAL_TABLET | Freq: Two times a day (BID) | ORAL | Status: DC
  Administered 2021-05-08 – 2021-05-11 (×6): 1 via ORAL
  Filled 2021-05-08 (×7): qty 1

## 2021-05-08 NOTE — Progress Notes (Addendum)
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  Due to have elective kyphoplasty early next week for lumbar compression fractures.  Had 2 witnessed syncopal episodes on her bed 7/21 where she lost consciousness, subsequently brought to the ED.  -She was noted to be hypotensive, creatinine was mildly elevated at 1.5 and white count was 24K   Assessment & Plan:   Syncope Hypotension -Syncopal event secondary to hypotension -Blood pressure appears to have stabilized after IV fluids, antibiotics,  stable now -Etiology likely dehydration, meds, vasodilators, concerned about infection too -had severe leukocytosis on admission, blood cultures negative x2 days, leukocytosis improving, on empiric vancomycin and cefepime -Treated with higher dose prednisone for 2 days, will transition back to 20 mg daily tomorrow -No events on telemetry  Chronic foot wounds/leukocytosis -Left foot underwent first toe amputation and resection of part of the first and second metatarsal head in March 22, MRI in April was concerning for osteomyelitis, she was treated with 6 weeks of IV daptomycin and ceftriaxone on 5/24 -X-rays noted cortical irregularity in both feet, raising concern for osteomyelitis, in the setting of worsening leukocytosis, vascular surgery consulted, she is followed by  Dr. Lucky Cowboy for this, has good peripheral pulses -ESR is 117 and CRP is 30, raising concern for osteomyelitis which may be chronic at this time -Will obtain MRI of left foot, discussed with ID Dr. Linus Salmons at [facility] who agrees with plan, will ask ID to consult on Monday  Pulmonary sarcoidosis/interstitial lung disease Pulmonary hypertension Chronic respiratory failure Chronic immunosuppression -On 2 to 3 L home O2 at baseline -On baseline prednisone 20 mg daily which was doubled, switch back to home dose tomorrow, holding methotrexate in the setting of possible infection -Continue Symbicort, Tyvaso (treprostinil), albuterol PRN -Low-dose Lasix x1 today  Subacute low back pain L2-L3-L4 compression fractures -L2 appears more subacute -Supposed to have a kyphoplasty by Dr. Rudene Christians early next week, this will need to be deferred pending infectious work-up as above  Acute kidney injury -Secondary to dehydration, hypotension, resolved  DVT prophylaxis: Heparin subcutaneous Code Status: Full code Family Communication: Spouse Dr. Margaretha Sheffield at bedside Disposition Plan:  Status is: Inpatient  Remains inpatient appropriate because:Inpatient level of care appropriate due to severity of illness  Dispo: The patient is from: Home              Anticipated d/c is to: Home              Patient currently is not medically stable to d/c.   Difficult to place patient No   Consultants:  Vascular surgery Dr. Driscilla Moats Dr.Menz ID d/w Dr.Comer  Procedures:   Antimicrobials:    Subjective: -Feels a little better overall, Flexeril helping with her back pain  Objective: Vitals:   05/08/21 0549 05/08/21 0844 05/08/21 1000 05/08/21 1136  BP: 135/77 (!) 162/95 131/68 (!) 143/79  Pulse: 76  85 90 88  Resp: _0 Temp: 97.7 F (36.5 C) 97.9 F (36.6 C)  98.4 F (36.9 C)  TempSrc: Oral     SpO2: 97% 97%  95%  Weight:      Height:        Intake/Output Summary (Last 24 hours) at 05/08/2021  1306 Last data filed at 05/08/2021 1137 Gross per 24 hour  Intake 1085.93 ml  Output 2275 ml  Net -1189.07 ml   Filed Weights   05/06/21 1053  Weight: 76.2 kg    Examination:  General exam: Pleasant chronically ill female, sitting up in bed, AAOx3, no distress CVS: S1-S2, regular rate rhythm Lungs: Decreased breath sounds the bases, improved air movement Abdomen: Soft, nontender, bowel sounds present  Extremities: Status post right great toe tip wound with ulcer, minimal purulence, no erythema or tenderness  Left foot with first toe and second metatarsal amputation, wound site with scant to moderate purulence, no surrounding erythema tenderness or warmth  Skin: As above Psychiatry: Mood & affect appropriate.     Data Reviewed:   CBC: Recent Labs  Lab 05/06/21 1119 05/07/21 0452 05/08/21 0430  WBC 24.7* 16.3* 14.3*  HGB 11.8* 10.8* 11.3*  HCT 36.6 33.6* 34.7*  MCV 95.6 96.3 96.1  PLT 295 275 700   Basic Metabolic Panel: Recent Labs  Lab 05/06/21 1119 05/07/21 0452 05/08/21 0430  NA 137 138 141  K 3.5 3.8 5.0  CL 95* 100 102  CO2 29 29 33*  GLUCOSE 124* 112* 122*  BUN 29* 27* 27*  CREATININE 1.56* 0.98 1.10*  CALCIUM 8.9 9.0 9.4   GFR: Estimated Creatinine Clearance: 53.5 mL/min (A) (by C-G formula based on SCr of 1.1 mg/dL (H)). Liver Function Tests: Recent Labs  Lab 05/06/21 1119 05/07/21 0452 05/08/21 0430  AST _1 ALT _2 ALKPHOS 67 60 73  BILITOT 1.1 0.9 0.8  PROT 6.9 6.6 6.6  ALBUMIN 3.1* 3.0* 2.7*   Recent Labs  Lab 05/06/21 1119  LIPASE 21   No results for input(s): AMMONIA in the last 168 hours. Coagulation Profile: No results for input(s): INR, PROTIME in the last 168 hours. Cardiac Enzymes: No results for input(s): CKTOTAL, CKMB, CKMBINDEX, TROPONINI in the last 168 hours. BNP (last 3 results) No results for input(s): PROBNP in the last 8760 hours. HbA1C: No results for input(s): HGBA1C in the last 72  hours. CBG: No results for input(s): GLUCAP in the last 168 hours. Lipid Profile: No results for input(s): CHOL, HDL, LDLCALC, TRIG, CHOLHDL, LDLDIRECT in the last 72 hours. Thyroid Function Tests: No results for input(s): TSH, T4TOTAL, FREET4, T3FREE, THYROIDAB in the last 72 hours. Anemia Panel: No results for input(s): VITAMINB12, FOLATE, FERRITIN, TIBC, IRON, RETICCTPCT in the last 72 hours. Urine analysis:    Component Value Date/Time   COLORURINE STRAW (A) 05/06/2021 1616   APPEARANCEUR CLEAR (A) 05/06/2021 1616   APPEARANCEUR Clear 04/21/2021 1551   LABSPEC 1.010 05/06/2021 1616   PHURINE 8.0 05/06/2021 1616   GLUCOSEU NEGATIVE 05/06/2021 1616   HGBUR SMALL (A) 05/06/2021 1616   BILIRUBINUR NEGATIVE 05/06/2021 1616   BILIRUBINUR Negative 04/21/2021 1551   KETONESUR NEGATIVE 05/06/2021 1616   PROTEINUR NEGATIVE 05/06/2021 1616   UROBILINOGEN 0.2 01/07/2020 1406   UROBILINOGEN 0.2 11/25/2014 1849   NITRITE NEGATIVE 05/06/2021 1616   LEUKOCYTESUR NEGATIVE 05/06/2021 1616   Sepsis Labs: _3 (procalcitonin:4,lacticidven:4)  ) Recent Results (from the past 240 hour(s))  Resp Panel by  RT-PCR (Flu A&B, Covid) Nasopharyngeal Swab     Status: None   Collection Time: 05/06/21  2:03 PM   Specimen: Nasopharyngeal Swab; Nasopharyngeal(NP) swabs in vial transport medium  Result Value Ref Range Status   SARS Coronavirus 2 by RT PCR NEGATIVE NEGATIVE Final    Comment: (NOTE) SARS-CoV-2 target nucleic acids are NOT DETECTED.  The SARS-CoV-2 RNA is generally detectable in upper respiratory specimens during the acute phase of infection. The lowest concentration of SARS-CoV-2 viral copies this assay can detect is 138 copies/mL. A negative result does not preclude SARS-Cov-2 infection and should not be used as the sole basis for treatment or other patient management decisions. A negative result may occur with  improper specimen collection/handling, submission of specimen  other than nasopharyngeal swab, presence of viral mutation(s) within the areas targeted by this assay, and inadequate number of viral copies(<138 copies/mL). A negative result must be combined with clinical observations, patient history, and epidemiological information. The expected result is Negative.  Fact Sheet for Patients:  EntrepreneurPulse.com.au  Fact Sheet for Healthcare Providers:  IncredibleEmployment.be  This test is no t yet approved or cleared by the [place] FDA and  has been authorized for detection and/or diagnosis of SARS-CoV-2 by FDA under an Emergency Use Authorization (EUA). This EUA will remain  in effect (meaning this test can be used) for the duration of the COVID-19 declaration under Section 564(b)(1) of the Act, 21 U.S.C.section 360bbb-3(b)(1), unless the authorization is terminated  or revoked sooner.       Influenza A by PCR NEGATIVE NEGATIVE Final   Influenza B by PCR NEGATIVE NEGATIVE Final    Comment: (NOTE) The Xpert Xpress SARS-CoV-2/FLU/RSV plus assay is intended as an aid in the diagnosis of influenza from Nasopharyngeal swab specimens and should not be used as a sole basis for treatment. Nasal washings and aspirates are unacceptable for Xpert Xpress SARS-CoV-2/FLU/RSV testing.  Fact Sheet for Patients: EntrepreneurPulse.com.au  Fact Sheet for Healthcare Providers: IncredibleEmployment.be  This test is not yet approved or cleared by the [place] FDA and has been authorized for detection and/or diagnosis of SARS-CoV-2 by FDA under an Emergency Use Authorization (EUA). This EUA will remain in effect (meaning this test can be used) for the duration of the COVID-19 declaration under Section 564(b)(1) of the Act, 21 U.S.C. section 360bbb-3(b)(1), unless the authorization is terminated or revoked.  Performed at [facility], [address]  [place], [place] [address]   Blood culture (routine x 2)     Status: None (Preliminary result)   Collection Time: 05/06/21  2:12 PM   Specimen: BLOOD  Result Value Ref Range Status   Specimen Description BLOOD BLOOD RIGHT ARM  Final   Special Requests   Final    BOTTLES DRAWN AEROBIC AND ANAEROBIC Blood Culture adequate volume   Culture   Final    NO GROWTH 2 DAYS Performed at [facility], [address], [place], [place] [address]    Report Status PENDING  Incomplete  Blood culture (routine x 2)     Status: None (Preliminary result)   Collection Time: 05/06/21  2:12 PM   Specimen: BLOOD  Result Value Ref Range Status   Specimen Description BLOOD LEFT ANTECUBITAL  Final   Special Requests   Final    BOTTLES DRAWN AEROBIC AND ANAEROBIC Blood Culture results may not be optimal due to an inadequate volume of blood received in culture bottles   Culture   Final    NO  GROWTH 2 DAYS Performed at [facility], [address] [place], [place] [address]    Report Status PENDING  Incomplete         Radiology Studies: MR LUMBAR SPINE WO CONTRAST  Result Date: 05/07/2021 CLINICAL DATA:  Low back pain, increased fracture risk EXAM: MRI LUMBAR SPINE WITHOUT CONTRAST TECHNIQUE: Multiplanar, multisequence MR imaging of the lumbar spine was performed. No intravenous contrast was administered. COMPARISON:  None. FINDINGS: Segmentation:  Standard Alignment:  Grade 1 retrolisthesis at L5-S1 Vertebrae: There is a chronic compression deformity of T12 approximately 25% height loss. There is a recent compression fracture of L2 with mild edema underlying the superior endplate. There is approximately 25% height loss. No retropulsion. There are age-indeterminate compression deformities of L3 and L4 with approximately 10% height loss at L3 and 25% height loss at L4. Conus medullaris and cauda equina: Conus extends to the L1 level. Conus and cauda equina appear normal. Paraspinal and other soft  tissues: Negative. Disc levels: L1-L2: Normal disc space and facet joints. No spinal canal stenosis. No neural foraminal stenosis. L2-L3: Normal disc space and facet joints. No spinal canal stenosis. No neural foraminal stenosis. L3-L4: Mild disc bulge and moderate facet hypertrophy. No spinal canal stenosis. No neural foraminal stenosis. L4-L5: Small left asymmetric disc bulge. Left lateral recess narrowing without central spinal canal stenosis. No neural foraminal stenosis. L5-S1: Small central disc protrusion with mild facet hypertrophy. No spinal canal stenosis. No neural foraminal stenosis. Visualized sacrum: Normal. IMPRESSION: 1. Acute or subacute compression fracture of L2 with approximately 25% height loss. No retropulsion. 2. Age-indeterminate compression deformities of L3 and L4 with approximately 10-25% height loss. 3. No central spinal canal or neural foraminal stenosis. 4. Chronic T12 compression deformity. Electronically Signed   By: Ulyses Jarred M.D.   On: 05/07/2021 02:47   US Carotid Bilateral  Result Date: 05/06/2021 CLINICAL DATA:  Syncope, collapse, history of TIA EXAM: BILATERAL CAROTID DUPLEX ULTRASOUND TECHNIQUE: Pearline Cables scale imaging, color Doppler and duplex ultrasound were performed of bilateral carotid and vertebral arteries in the neck. COMPARISON:  07/18/2020 FINDINGS: Criteria: Quantification of carotid stenosis is based on velocity parameters that correlate the residual internal carotid diameter with NASCET-based stenosis levels, using the diameter of the distal internal carotid lumen as the denominator for stenosis measurement. The following velocity measurements were obtained: RIGHT ICA: 110/24 cm/sec CCA: 62/13 cm/sec SYSTOLIC ICA/CCA RATIO:  1.4 ECA: 139 cm/sec LEFT ICA: 83/14 cm/sec CCA: 086/57 cm/sec SYSTOLIC ICA/CCA RATIO:  0.8 ECA: 105 cm/sec RIGHT CAROTID ARTERY: Mild calcified atheromatous plaque at the carotid bifurcation. No significant stenosis by grayscale imaging.  RIGHT VERTEBRAL ARTERY:  Antegrade. LEFT CAROTID ARTERY: Minimal atheromatous plaque within the proximal left internal carotid artery. No significant stenosis by grayscale imaging. LEFT VERTEBRAL ARTERY:  Antegrade. IMPRESSION: 1. No significant flow limiting stenosis within either carotid artery. Estimated 0-49% stenosis bilaterally. Electronically Signed   By: Randa Ngo M.D.   On: 05/06/2021 17:30   DG Foot Complete Left  Result Date: 05/06/2021 CLINICAL DATA:  Wound eval for osteomyelitis EXAM: LEFT FOOT - COMPLETE 3+ VIEW COMPARISON:  CT 02/01/2021 FINDINGS: Prior great toe amputation to the level the first metatarsal with persistent and cortical irregularity and fragmentation. Prior second toe amputation to the distal metatarsal, with continued small oblique fracture of the metatarsal neck and adjacent bony fragmentation. Prior third toe amputation with residual third metatarsal. There is no new osseous destruction of the third metatarsal head. Similar soft tissue within swelling along the  amputation sites. IMPRESSION: Persistent irregularity and bony fragmentation distal aspect of the residual first and second metatarsals, with persistent soft tissue wound and swelling along the amputation sites. Findings could be related to postoperative change or osteomyelitis. No new bony destruction of the third metatarsal head. Electronically Signed   By: Maurine Simmering   On: 05/06/2021 14:16   DG Toe Great Right  Result Date: 05/06/2021 CLINICAL DATA:  Wound, eval for osteomyelitis EXAM: RIGHT GREAT TOE COMPARISON:  None. FINDINGS: There is cortical regularity of the great toe distal tuft, with overlying soft tissue defect and swelling. IMPRESSION: Findings concerning for osteomyelitis of the great toe distal tuft. Electronically Signed   By: Maurine Simmering   On: 05/06/2021 14:17        Scheduled Meds:  aspirin EC  81 mg Oral Daily   B-complex with vitamin C  1 tablet Oral Daily   cyclobenzaprine  5 mg  Oral TID   dorzolamide  1 drop Both Eyes BID   escitalopram  20 mg Oral Daily   fluticasone furoate-vilanterol  1 puff Inhalation Daily   folic acid  1 mg Oral Daily   heparin  5,000 Units Subcutaneous Q8H   latanoprost  1 drop Right Eye QHS   levothyroxine  137 mcg Oral QAC breakfast   multivitamin with minerals  1 tablet Oral Daily   potassium citrate  20 mEq Oral TID WC   [START ON 05/09/2021] predniSONE  20 mg Oral Q breakfast   senna-docusate  1 tablet Oral BID   Treprostinil  72 mcg Inhalation QID   Continuous Infusions:  sodium chloride 250 mL (05/07/21 1800)   ceFEPime (MAXIPIME) IV 2 g (05/08/21 0906)   vancomycin Stopped (05/08/21 0227)     LOS: 2 days    Time spent: 32mn   PDomenic Polite MD [facility]   05/08/2021, 1:06 PM

## 2021-05-09 ENCOUNTER — Encounter: Payer: Self-pay | Admitting: Family Medicine

## 2021-05-09 LAB — BASIC METABOLIC PANEL
Anion gap: 11 (ref 5–15)
BUN: 36 mg/dL — ABNORMAL HIGH (ref 8–23)
CO2: 31 mmol/L (ref 22–32)
Calcium: 9.5 mg/dL (ref 8.9–10.3)
Chloride: 97 mmol/L — ABNORMAL LOW (ref 98–111)
Creatinine, Ser: 1.09 mg/dL — ABNORMAL HIGH (ref 0.44–1.00)
GFR, Estimated: 56 mL/min — ABNORMAL LOW (ref >60)
Glucose, Bld: 114 mg/dL — ABNORMAL HIGH (ref 70–99)
Potassium: 4.1 mmol/L (ref 3.5–5.1)
Sodium: 139 mmol/L (ref 135–145)

## 2021-05-09 LAB — CBC
HCT: 33 % — ABNORMAL LOW (ref 36.0–46.0)
Hemoglobin: 10.8 g/dL — ABNORMAL LOW (ref 12.0–15.0)
MCH: 31.2 pg (ref 26.0–34.0)
MCHC: 32.7 g/dL (ref 30.0–36.0)
MCV: 95.4 fL (ref 80.0–100.0)
Platelets: 321 10*3/uL (ref 150–400)
RBC: 3.46 MIL/uL — ABNORMAL LOW (ref 3.87–5.11)
RDW: 14 % (ref 11.5–15.5)
WBC: 12.5 10*3/uL — ABNORMAL HIGH (ref 4.0–10.5)
nRBC: 0 % (ref 0.0–0.2)

## 2021-05-09 MED ORDER — LIDOCAINE 5 % EX PTCH
2.0000 | MEDICATED_PATCH | CUTANEOUS | Status: DC
  Administered 2021-05-09 – 2021-05-11 (×3): 2 via TRANSDERMAL
  Filled 2021-05-09 (×4): qty 2

## 2021-05-09 MED ORDER — KETOROLAC TROMETHAMINE 15 MG/ML IJ SOLN
15.0000 mg | Freq: Four times a day (QID) | INTRAMUSCULAR | Status: DC | PRN
  Administered 2021-05-09: 17:00:00 15 mg via INTRAVENOUS
  Filled 2021-05-09 (×2): qty 1

## 2021-05-09 NOTE — Progress Notes (Signed)
Dc tele per Dr Broadus John

## 2021-05-09 NOTE — Progress Notes (Signed)
Have scheduled her for kyphoplasty tomorrow if medically ok for procedure under sedation with local.

## 2021-05-09 NOTE — Progress Notes (Signed)
Patient self administered tyvaso aerosol. Unit prepped for usage tomorrow. Home cpap placed at bedside. O2 available for use with cpap. Patient will have RN to call if assistance is needed.

## 2021-05-09 NOTE — Progress Notes (Signed)
PROGRESS NOTE    LAKETIA VICKNAIR  GEX:[id number] DOB: 1956-10-15 DOA: 05/06/2021 PCP: Glean Hess, MD  Brief Narrative:Autymn Samara is a chronically ill female with history of pulmonary sarcoidosis on chronic prednisone, methotrexate, ILD, chronic respiratory failure on 2 to 3 L home O2, pulmonary hypertension, CKD stage III, history of CVA, hypothyroidism, nephrolithiasis, history of left foot gangrene status post ray and second metatarsal head amputation in 3/22, she has chronic wounds that are dressed daily by her spouse, OSA on CPAP presented to the ED for evaluation after 2 syncopal episodes, she has been having low back pain for the last 1 month, worse in the last week.  Due to have elective kyphoplasty this upcoming week per Dr.Menz for lumbar compression fractures.  Had 2 witnessed syncopal episodes on her bed 7/21 where she lost consciousness, subsequently brought to the ED.  -She was noted to be hypotensive(BP 80s), creatinine was mildly elevated at 1.5 and white count was 24K   Assessment & Plan:   Syncope Hypotension -Syncopal event secondary to hypotension -Blood pressure stabilized after IV fluids, antibiotics -Etiology likely dehydration, meds, vasodilators, ? infection -had severe leukocytosis on admission, blood cultures negative x2 days, leukocytosis improving, on empiric vancomycin and cefepime -Treated with higher dose prednisone for 2 days, will transition back to 20 mg daily today -No events on telemetry -ambulate with PT  Chronic foot wounds/leukocytosis H/o L foot Osteomyelitis 4/22 -Left foot underwent first toe amputation and resection of part of the first and second metatarsal head in March 22, MRI in April was concerning for osteomyelitis, she was treated with 6 weeks of IV daptomycin and ceftriaxone on 5/24 -X-rays noted cortical irregularity in both feet, raising concern for osteomyelitis, in the setting of worsening leukocytosis, vascular surgery  consulted, she is followed by Dr. Lucky Cowboy for this, has good peripheral pulses,  -Admitted with syncope, hypotension and leukocytosis, ESR is 117 and CRP was 30, hence MRI L foot obtained yesterday, this is reassuring, substantial improvement noted compared to MRI in March, no marrow edema noted, some inflammatory changes noted in the of overlying soft tissue -Vascular Dr. Lucky Cowboy to evaluate tomorrow, remains on IV Vanc/Cefepime -Discussed with ID Dr. Linus Salmons at [facility] yesterday, will ask ID at [facility] to evaluate tomorrow  Pulmonary sarcoidosis/interstitial lung disease Pulmonary hypertension Chronic respiratory failure Chronic immunosuppression -On 2 to 3 L home O2 at baseline -On baseline prednisone 20 mg daily which was doubled, switch back to home dose tomorrow, holding methotrexate in the setting of possible infection -Continue Symbicort, Tyvaso (treprostinil), albuterol PRN -given lasix x1 yesterday  Subacute low back pain L2-L3-L4 compression fractures -L2 appears more subacute -Continue Flexeril, PRN hydrocodone, add lidocaine patch -Supposed to have a kyphoplasty by Dr. Rudene Christians tomorrow, planned prior to admission -await ID input prior to this  Acute kidney injury -Secondary to dehydration, hypotension, resolved  DVT prophylaxis: SCDs, hold heparin Code Status: Full code Family Communication: Spouse Dr. Margaretha Sheffield at bedside Disposition Plan:  Status is: Inpatient  Remains inpatient appropriate because:Inpatient level of care appropriate due to severity of illness  Dispo: The patient is from: Home              Anticipated d/c is to: Home              Patient currently is not medically stable to d/c.   Difficult to place patient No   Consultants:  Vascular surgery Dr. Driscilla Moats Dr.Menz ID d/w Dr.Comer  Procedures:   Antimicrobials:  Subjective: -Continues to have moderate amount of low back pain, Flexeril and hydrocodone helping some,  Objective: Vitals:   05/08/21  2316 05/09/21 0638 05/09/21 0813 05/09/21 1219  BP:  (!) 146/80 (!) 143/58 (!) 147/87  Pulse:  76 92 92  Resp: _0 Temp:  98.1 F (36.7 C) 97.9 F (36.6 C) 97.9 F (36.6 C)  TempSrc:      SpO2:  100% 98% 93%  Weight:      Height:        Intake/Output Summary (Last 24 hours) at 05/09/2021 1234 Last data filed at 05/09/2021 9024 Gross per 24 hour  Intake 620.8 ml  Output 1250 ml  Net -629.2 ml   Filed Weights   05/06/21 1053  Weight: 76.2 kg    Examination:  General exam: Pleasant chronically ill female sitting up in bed, AAOx3, no distress CVS: S1-S2, regular rate rhythm Lungs: Decreased breath sounds at the bases, no wheezes today CVS: S1-S2, regular rate rhythm Abdomen: Soft, nontender, bowel sounds present  Extremities: Status post right great toe tip wound with ulcer, minimal purulence, no erythema or tenderness  Left foot with first toe and second metatarsal amputation, wound site with scant to moderate purulence, no surrounding erythema tenderness or warmth  Skin: As above Psychiatry: Mood & affect appropriate.     Data Reviewed:   CBC: Recent Labs  Lab 05/06/21 1119 05/07/21 0452 05/08/21 0430 05/09/21 0535  WBC 24.7* 16.3* 14.3* 12.5*  HGB 11.8* 10.8* 11.3* 10.8*  HCT 36.6 33.6* 34.7* 33.0*  MCV 95.6 96.3 96.1 95.4  PLT 295 275 296 097   Basic Metabolic Panel: Recent Labs  Lab 05/06/21 1119 05/07/21 0452 05/08/21 0430 05/09/21 0535  NA 137 138 141 139  K 3.5 3.8 5.0 4.1  CL 95* 100 102 97*  CO2 29 29 33* 31  GLUCOSE 124* 112* 122* 114*  BUN 29* 27* 27* 36*  CREATININE 1.56* 0.98 1.10* 1.09*  CALCIUM 8.9 9.0 9.4 9.5   GFR: Estimated Creatinine Clearance: 54 mL/min (A) (by C-G formula based on SCr of 1.09 mg/dL (H)). Liver Function Tests: Recent Labs  Lab 05/06/21 1119 05/07/21 0452 05/08/21 0430  AST _1 ALT _2 ALKPHOS 67 60 73  BILITOT 1.1 0.9 0.8  PROT 6.9 6.6 6.6  ALBUMIN 3.1* 3.0* 2.7*   Recent Labs   Lab 05/06/21 1119  LIPASE 21   No results for input(s): AMMONIA in the last 168 hours. Coagulation Profile: No results for input(s): INR, PROTIME in the last 168 hours. Cardiac Enzymes: No results for input(s): CKTOTAL, CKMB, CKMBINDEX, TROPONINI in the last 168 hours. BNP (last 3 results) No results for input(s): PROBNP in the last 8760 hours. HbA1C: No results for input(s): HGBA1C in the last 72 hours. CBG: No results for input(s): GLUCAP in the last 168 hours. Lipid Profile: No results for input(s): CHOL, HDL, LDLCALC, TRIG, CHOLHDL, LDLDIRECT in the last 72 hours. Thyroid Function Tests: No results for input(s): TSH, T4TOTAL, FREET4, T3FREE, THYROIDAB in the last 72 hours. Anemia Panel: No results for input(s): VITAMINB12, FOLATE, FERRITIN, TIBC, IRON, RETICCTPCT in the last 72 hours. Urine analysis:    Component Value Date/Time   COLORURINE STRAW (A) 05/06/2021 1616   APPEARANCEUR CLEAR (A) 05/06/2021 1616   APPEARANCEUR Clear 04/21/2021 1551   LABSPEC 1.010 05/06/2021 1616   PHURINE 8.0 05/06/2021 1616   GLUCOSEU NEGATIVE 05/06/2021 1616   HGBUR SMALL (A) 05/06/2021 1616   BILIRUBINUR  NEGATIVE 05/06/2021 1616   BILIRUBINUR Negative 04/21/2021 1551   KETONESUR NEGATIVE 05/06/2021 1616   PROTEINUR NEGATIVE 05/06/2021 1616   UROBILINOGEN 0.2 01/07/2020 1406   UROBILINOGEN 0.2 11/25/2014 1849   NITRITE NEGATIVE 05/06/2021 1616   LEUKOCYTESUR NEGATIVE 05/06/2021 1616   Sepsis Labs: _0 (procalcitonin:4,lacticidven:4)  ) Recent Results (from the past 240 hour(s))  Resp Panel by RT-PCR (Flu A&B, Covid) Nasopharyngeal Swab     Status: None   Collection Time: 05/06/21  2:03 PM   Specimen: Nasopharyngeal Swab; Nasopharyngeal(NP) swabs in vial transport medium  Result Value Ref Range Status   SARS Coronavirus 2 by RT PCR NEGATIVE NEGATIVE Final    Comment: (NOTE) SARS-CoV-2 target nucleic acids are NOT DETECTED.  The SARS-CoV-2 RNA is generally detectable in  upper respiratory specimens during the acute phase of infection. The lowest concentration of SARS-CoV-2 viral copies this assay can detect is 138 copies/mL. A negative result does not preclude SARS-Cov-2 infection and should not be used as the sole basis for treatment or other patient management decisions. A negative result may occur with  improper specimen collection/handling, submission of specimen other than nasopharyngeal swab, presence of viral mutation(s) within the areas targeted by this assay, and inadequate number of viral copies(<138 copies/mL). A negative result must be combined with clinical observations, patient history, and epidemiological information. The expected result is Negative.  Fact Sheet for Patients:  EntrepreneurPulse.com.au  Fact Sheet for Healthcare Providers:  IncredibleEmployment.be  This test is no t yet approved or cleared by the [place] FDA and  has been authorized for detection and/or diagnosis of SARS-CoV-2 by FDA under an Emergency Use Authorization (EUA). This EUA will remain  in effect (meaning this test can be used) for the duration of the COVID-19 declaration under Section 564(b)(1) of the Act, 21 U.S.C.section 360bbb-3(b)(1), unless the authorization is terminated  or revoked sooner.       Influenza A by PCR NEGATIVE NEGATIVE Final   Influenza B by PCR NEGATIVE NEGATIVE Final    Comment: (NOTE) The Xpert Xpress SARS-CoV-2/FLU/RSV plus assay is intended as an aid in the diagnosis of influenza from Nasopharyngeal swab specimens and should not be used as a sole basis for treatment. Nasal washings and aspirates are unacceptable for Xpert Xpress SARS-CoV-2/FLU/RSV testing.  Fact Sheet for Patients: EntrepreneurPulse.com.au  Fact Sheet for Healthcare Providers: IncredibleEmployment.be  This test is not yet approved or cleared by the [place] FDA and has been  authorized for detection and/or diagnosis of SARS-CoV-2 by FDA under an Emergency Use Authorization (EUA). This EUA will remain in effect (meaning this test can be used) for the duration of the COVID-19 declaration under Section 564(b)(1) of the Act, 21 U.S.C. section 360bbb-3(b)(1), unless the authorization is terminated or revoked.  Performed at [facility], [address] [place], [place] [address]   Blood culture (routine x 2)     Status: None (Preliminary result)   Collection Time: 05/06/21  2:12 PM   Specimen: BLOOD  Result Value Ref Range Status   Specimen Description BLOOD BLOOD RIGHT ARM  Final   Special Requests   Final    BOTTLES DRAWN AEROBIC AND ANAEROBIC Blood Culture adequate volume   Culture   Final    NO GROWTH 2 DAYS Performed at [facility], [address] [place], [place] [address]    Report Status PENDING  Incomplete  Blood culture (routine x 2)     Status: None (Preliminary result)   Collection Time: 05/06/21  2:12  PM   Specimen: BLOOD  Result Value Ref Range Status   Specimen Description BLOOD LEFT ANTECUBITAL  Final   Special Requests   Final    BOTTLES DRAWN AEROBIC AND ANAEROBIC Blood Culture results may not be optimal due to an inadequate volume of blood received in culture bottles   Culture   Final    NO GROWTH 2 DAYS Performed at [facility], [address] [place], [place] [address]    Report Status PENDING  Incomplete         Radiology Studies: MR FOOT LEFT W WO CONTRAST  Result Date: 05/08/2021 CLINICAL DATA:  Foot swelling, diabetic, osteomyelitis suspected. History of pulmonary sarcoidosis on chronic prednisone, methotrexate, ILD, chronic respiratory failure on 2 to 3 L home O2, pulmonary hypertension, CKD stage III, history of CVA, hypothyroidism, nephrolithiasis, history of left foot gangrene status post amputation in March 2022. Chronic wounds. EXAM: MRI OF THE LEFT FOREFOOT WITHOUT AND WITH CONTRAST  TECHNIQUE: Multiplanar, multisequence MR imaging of the left forefoot was performed both before and after administration of intravenous contrast. CONTRAST:  7.47m GADAVIST GADOBUTROL 1 MMOL/ML IV SOLN COMPARISON:  Multiple exams, including 05/06/2021 and 02/03/2021 FINDINGS: Despite efforts by the technologist and patient, motion artifact is present on today's exam and could not be eliminated. This reduces exam sensitivity and specificity. Bones/Joint/Cartilage Prior amputation of the first digit at the level of the metatarsal head and second digit at the level of the distal metatarsal metaphysis. Prior amputation of the third digit at the level of the MTP joint. Substantial improvement in the marrow edema and enhancement along the distal margin of the first metatarsal, with only a thin rim of peripheral accentuated T2 signal along the distal margin of the first metatarsal as shown on image 11 series 9. Similar improved and nearly resolved edema in the head of the third metatarsal and along the distal margin of the second metatarsal the amputation site. Subtle residual endosteal edema primarily along the distal margins of the first metatarsal margin and head of the third metatarsal, but not a characteristic appearance for active osteomyelitis. Ligaments Lisfranc ligament intact Muscles and Tendons Mild flexor hallucis longus tenosynovitis just proximal to the knot of Henry. Low-level edema along the flexor digitorum tendons distally. Soft tissues Substantial improvement in the edema and enhancement along the amputation flap. There still some moderate residual accentuated enhancement suggesting inflammatory findings in the flap overlying the first and second metatarsal heads, with associated low T1 signal and overlying bandaging. However, I not see a drainable fluid collection nor do I see obvious substantial hypoenhancing regions at in this area to indicate abscess or necrotic soft tissues. IMPRESSION: 1.  Substantial improvement compared to 02/03/2021, with near complete resolution of the marrow edema previously along the distal margin of the first metatarsal and in the third metatarsal head, and only with some subtle residual endosteal edema in these regions. No active osteomyelitis currently identified. 2. Inflammatory findings along the flap overlying the first and second and to a lesser extent third metatarsal margins, with abnormal edema signal and enhancement but no hypoenhancement characteristic of necrotic tissue, and no appreciable drainable abscess. 3. Mild flexor hallucis longus tenosynovitis and edema tracking along the flexor digitorum tendons. Electronically Signed   By: WVan ClinesM.D.   On: 05/08/2021 17:08        Scheduled Meds:  aspirin EC  81 mg Oral Daily   B-complex with vitamin C  1 tablet Oral Daily   cyclobenzaprine  5 mg Oral TID   dorzolamide  1 drop Both Eyes BID   escitalopram  20 mg Oral Daily   fluticasone furoate-vilanterol  1 puff Inhalation Daily   folic acid  1 mg Oral Daily   latanoprost  1 drop Right Eye QHS   levothyroxine  137 mcg Oral QAC breakfast   lidocaine  2 patch Transdermal Q24H   multivitamin with minerals  1 tablet Oral Daily   predniSONE  20 mg Oral Q breakfast   senna-docusate  1 tablet Oral BID   Treprostinil  72 mcg Inhalation QID   Continuous Infusions:  sodium chloride 10 mL/hr at 05/08/21 1643   ceFEPime (MAXIPIME) IV 2 g (05/09/21 0909)   vancomycin Stopped (05/08/21 2244)     LOS: 3 days    Time spent: 22mn   PDomenic Polite MD [facility]   05/09/2021, 12:34 PM

## 2021-05-10 ENCOUNTER — Inpatient Hospital Stay: Payer: BC Managed Care – PPO

## 2021-05-10 ENCOUNTER — Inpatient Hospital Stay: Payer: BC Managed Care – PPO | Admitting: Certified Registered Nurse Anesthetist

## 2021-05-10 ENCOUNTER — Encounter
Admission: EM | Disposition: A | Discharge status: Home / Self Care | Payer: Self-pay | Source: Home / Self Care | Attending: Internal Medicine

## 2021-05-10 ENCOUNTER — Encounter: Payer: Self-pay | Admitting: Family Medicine

## 2021-05-10 DIAGNOSIS — R55 Syncope and collapse: Secondary | ICD-10-CM | POA: Diagnosis not present

## 2021-05-10 DIAGNOSIS — S91309A Unspecified open wound, unspecified foot, initial encounter: Secondary | ICD-10-CM | POA: Diagnosis not present

## 2021-05-10 HISTORY — PX: KYPHOPLASTY: SHX5884

## 2021-05-10 LAB — BASIC METABOLIC PANEL
Anion gap: 7 (ref 5–15)
BUN: 37 mg/dL — ABNORMAL HIGH (ref 8–23)
CO2: 33 mmol/L — ABNORMAL HIGH (ref 22–32)
Calcium: 9.3 mg/dL (ref 8.9–10.3)
Chloride: 99 mmol/L (ref 98–111)
Creatinine, Ser: 1.09 mg/dL — ABNORMAL HIGH (ref 0.44–1.00)
GFR, Estimated: 56 mL/min — ABNORMAL LOW (ref >60)
Glucose, Bld: 86 mg/dL (ref 70–99)
Potassium: 4.3 mmol/L (ref 3.5–5.1)
Sodium: 139 mmol/L (ref 135–145)

## 2021-05-10 LAB — CBC
HCT: 34.9 % — ABNORMAL LOW (ref 36.0–46.0)
Hemoglobin: 11.1 g/dL — ABNORMAL LOW (ref 12.0–15.0)
MCH: 30.2 pg (ref 26.0–34.0)
MCHC: 31.8 g/dL (ref 30.0–36.0)
MCV: 95.1 fL (ref 80.0–100.0)
Platelets: 300 10*3/uL (ref 150–400)
RBC: 3.67 MIL/uL — ABNORMAL LOW (ref 3.87–5.11)
RDW: 13.9 % (ref 11.5–15.5)
WBC: 11.1 10*3/uL — ABNORMAL HIGH (ref 4.0–10.5)
nRBC: 0 % (ref 0.0–0.2)

## 2021-05-10 SURGERY — KYPHOPLASTY
Anesthesia: General

## 2021-05-10 MED ORDER — FENTANYL CITRATE (PF) 100 MCG/2ML IJ SOLN
INTRAMUSCULAR | Status: AC
  Filled 2021-05-10: qty 2

## 2021-05-10 MED ORDER — 0.9 % SODIUM CHLORIDE (POUR BTL) OPTIME
TOPICAL | Status: DC | PRN
  Administered 2021-05-10: 500 mL

## 2021-05-10 MED ORDER — IOHEXOL 180 MG/ML  SOLN
INTRAMUSCULAR | Status: DC | PRN
  Administered 2021-05-10: 15 mL

## 2021-05-10 MED ORDER — MIDAZOLAM HCL 2 MG/2ML IJ SOLN
INTRAMUSCULAR | Status: DC | PRN
  Administered 2021-05-10: .5 mg via INTRAVENOUS
  Administered 2021-05-10: 1.5 mg via INTRAVENOUS

## 2021-05-10 MED ORDER — MIDAZOLAM HCL 2 MG/2ML IJ SOLN
INTRAMUSCULAR | Status: AC
  Filled 2021-05-10: qty 2

## 2021-05-10 MED ORDER — LIDOCAINE HCL (PF) 2 % IJ SOLN
INTRAMUSCULAR | Status: AC
  Filled 2021-05-10: qty 5

## 2021-05-10 MED ORDER — HYDROCORTISONE NA SUCCINATE PF 100 MG IJ SOLR
INTRAMUSCULAR | Status: AC
  Filled 2021-05-10: qty 2

## 2021-05-10 MED ORDER — DEXMEDETOMIDINE HCL 200 MCG/2ML IV SOLN
INTRAVENOUS | Status: DC | PRN
  Administered 2021-05-10 (×5): 4 ug via INTRAVENOUS

## 2021-05-10 MED ORDER — BUPIVACAINE-EPINEPHRINE (PF) 0.5% -1:200000 IJ SOLN
INTRAMUSCULAR | Status: AC
  Filled 2021-05-10: qty 60

## 2021-05-10 MED ORDER — FENTANYL CITRATE (PF) 100 MCG/2ML IJ SOLN: 25.0000 ug | INTRAMUSCULAR | Status: DC | PRN

## 2021-05-10 MED ORDER — BUPIVACAINE-EPINEPHRINE (PF) 0.5% -1:200000 IJ SOLN
INTRAMUSCULAR | Status: DC | PRN
  Administered 2021-05-10: 40 mL

## 2021-05-10 MED ORDER — LIDOCAINE HCL (PF) 1 % IJ SOLN
INTRAMUSCULAR | Status: AC
  Filled 2021-05-10: qty 120

## 2021-05-10 MED ORDER — LIDOCAINE HCL 1 % IJ SOLN
INTRAMUSCULAR | Status: DC | PRN
  Administered 2021-05-10: 30 mL

## 2021-05-10 MED ORDER — BUPIVACAINE-EPINEPHRINE (PF) 0.5% -1:200000 IJ SOLN
INTRAMUSCULAR | Status: AC
  Filled 2021-05-10: qty 30

## 2021-05-10 MED ORDER — PHENYLEPHRINE HCL (PRESSORS) 10 MG/ML IV SOLN
INTRAVENOUS | Status: DC | PRN
  Administered 2021-05-10 (×2): 100 ug via INTRAVENOUS

## 2021-05-10 MED ORDER — KETOROLAC TROMETHAMINE 15 MG/ML IJ SOLN
15.0000 mg | Freq: Once | INTRAMUSCULAR | Status: AC
  Administered 2021-05-10: 15 mg via INTRAVENOUS
  Filled 2021-05-10: qty 1

## 2021-05-10 MED ORDER — PROPOFOL 500 MG/50ML IV EMUL
INTRAVENOUS | Status: DC | PRN
  Administered 2021-05-10: 25 ug/kg/min via INTRAVENOUS

## 2021-05-10 MED ORDER — PROPOFOL 10 MG/ML IV BOLUS
INTRAVENOUS | Status: AC
  Filled 2021-05-10: qty 20

## 2021-05-10 MED ORDER — FENTANYL CITRATE (PF) 100 MCG/2ML IJ SOLN
25.0000 ug | INTRAMUSCULAR | Status: DC | PRN
  Administered 2021-05-10 (×4): 25 ug via INTRAVENOUS

## 2021-05-10 MED ORDER — HYDROCORTISONE NA SUCCINATE PF 100 MG IJ SOLR
INTRAMUSCULAR | Status: DC | PRN
  Administered 2021-05-10: 100 mg via INTRAVENOUS

## 2021-05-10 SURGICAL SUPPLY — 21 items
CEMENT KYPHON CX01A KIT/MIXER (Cement) ×2 IMPLANT
DERMABOND ADVANCED (GAUZE/BANDAGES/DRESSINGS) ×1
DERMABOND ADVANCED .7 DNX12 (GAUZE/BANDAGES/DRESSINGS) ×1 IMPLANT
DEVICE BIOPSY BONE KYPHX (INSTRUMENTS) ×2 IMPLANT
DRAPE C-ARM XRAY 36X54 (DRAPES) ×2 IMPLANT
DRAPE U-SHAPE 47X51 STRL (DRAPES) ×2 IMPLANT
DURAPREP 26ML APPLICATOR (WOUND CARE) ×2 IMPLANT
GAUZE 4X4 16PLY ~~LOC~~+RFID DBL (SPONGE) ×2 IMPLANT
GLOVE SURG SYN 9.0  PF PI (GLOVE) ×1
GLOVE SURG SYN 9.0 PF PI (GLOVE) ×1 IMPLANT
GOWN SRG 2XL LVL 4 RGLN SLV (GOWNS) ×1 IMPLANT
GOWN STRL NON-REIN 2XL LVL4 (GOWNS) ×1
GOWN STRL REUS W/ TWL LRG LVL3 (GOWN DISPOSABLE) ×1 IMPLANT
GOWN STRL REUS W/TWL LRG LVL3 (GOWN DISPOSABLE) ×1
MANIFOLD NEPTUNE II (INSTRUMENTS) ×2 IMPLANT
PACK KYPHOPLASTY (MISCELLANEOUS) ×2 IMPLANT
RENTAL RFA GENERATOR (MISCELLANEOUS) IMPLANT
STRAP SAFETY 5IN WIDE (MISCELLANEOUS) ×2 IMPLANT
SWABSTK COMLB BENZOIN TINCTURE (MISCELLANEOUS) ×2 IMPLANT
TRAY KYPHOPAK 15/3 EXPRESS 1ST (MISCELLANEOUS) IMPLANT
TRAY KYPHOPAK 20/3 EXPRESS 1ST (MISCELLANEOUS) ×4 IMPLANT

## 2021-05-10 NOTE — Anesthesia Postprocedure Evaluation (Signed)
Anesthesia Post Note  Patient: Stacie [name]  Procedure(s) Performed: KYPHOPLASTY, L2, L3, L4  Patient location during evaluation: PACU Anesthesia Type: General Level of consciousness: awake and alert Pain management: pain level controlled Vital Signs Assessment: post-procedure vital signs reviewed and stable Respiratory status: spontaneous breathing, nonlabored ventilation, respiratory function stable and patient connected to nasal cannula oxygen Cardiovascular status: blood pressure returned to baseline and stable Postop Assessment: no apparent nausea or vomiting Anesthetic complications: no   No notable events documented.   Last Vitals:  Vitals:   05/10/21 1345 05/10/21 1437  BP: 122/74 128/73  Pulse: 95 92  Resp: 20 18  Temp: 36.7 C 36.7 C  SpO2: 98% 98%    Last Pain:  Vitals:   05/10/21 1437  TempSrc: Oral  PainSc:                  Martha Clan

## 2021-05-10 NOTE — Progress Notes (Signed)
Physical Therapy Treatment Patient Details Name: Stacie [name] MRN: [id number] DOB: 1956/06/11 Today's Date: 05/10/2021    History of Present Illness Pt is a 65 y/o F admitted on 05/06/21 after presenting with c/o 2 syncopal episodes. Pt was found to be hypotensive. Of note, pt has planned kyphoplasty next week. PMH: pulmonary sarcoidosis on chronic prednisone, methotrexate, ILD, chronic respiratory failure on 2-3L home O2, pulmonary HTN, CKD stage 3, CVA, hypothyroidism, nephrolithiasis, hx L foot ganagrene 2/p ray & 2nd metatarsal ahead amputation on 3/22, OSA on CPAP    PT Comments    Pt returned to floor from kyphoplasty.  Feeling good and wanting to try mobility.  Bed mobility with supervision.  She is able to stand for standing marches, SLR and heel raises.  She needs to void and is able to walk to/from bathroom with min guard/supervision.  Overall tolerated mobility well but elects to return to bed to rest.  Reports some soreness but overall pleased with outcome so far.     Follow Up Recommendations  Home health PT;Supervision/Assistance - 24 hour     Equipment Recommendations  None recommended by PT    Recommendations for Other Services       Precautions / Restrictions Precautions Precautions: Fall Restrictions Weight Bearing Restrictions: No Other Position/Activity Restrictions: pt reports no restrictions following LLE transmet amputation in March 2022 and R toe amputation    Mobility  Bed Mobility Overal bed mobility: Needs Assistance Bed Mobility: Supine to Sit;Sit to Supine     Supine to sit: Supervision Sit to supine: Supervision        Transfers Overall transfer level: Needs assistance Equipment used: Rolling walker (2 wheeled) Transfers: Sit to/from Stand Sit to Stand: Supervision            Ambulation/Gait Ambulation/Gait assistance: Supervision;Min guard Gait Distance (Feet): 40 Feet Assistive device: Rolling walker (2 wheeled) Gait  Pattern/deviations: Step-through pattern;Decreased step length - right;Decreased step length - left Gait velocity: decreased       Stairs             Wheelchair Mobility    Modified Rankin (Stroke Patients Only)       Balance Overall balance assessment: Needs assistance Sitting-balance support: Bilateral upper extremity supported;Feet supported Sitting balance-Leahy Scale: Good     Standing balance support: No upper extremity supported;During functional activity Standing balance-Leahy Scale: Fair                              Cognition Arousal/Alertness: Awake/alert Behavior During Therapy: WFL for tasks assessed/performed Overall Cognitive Status: Within Functional Limits for tasks assessed                                        Exercises      General Comments        Pertinent Vitals/Pain Pain Assessment: No/denies pain    Home Living                      Prior Function            PT Goals (current goals can now be found in the care plan section) Progress towards PT goals: Progressing toward goals    Frequency    Min 2X/week      PT Plan Current plan remains appropriate    Co-evaluation  AM-PAC PT "6 Clicks" Mobility   Outcome Measure  Help needed turning from your back to your side while in a flat bed without using bedrails?: None Help needed moving from lying on your back to sitting on the side of a flat bed without using bedrails?: A Little Help needed moving to and from a bed to a chair (including a wheelchair)?: A Little Help needed standing up from a chair using your arms (e.g., wheelchair or bedside chair)?: A Little Help needed to walk in hospital room?: A Little Help needed climbing 3-5 steps with a railing? : A Little 6 Click Score: 19    End of Session Equipment Utilized During Treatment: Oxygen Activity Tolerance: Patient tolerated treatment well Patient left: with  call bell/phone within reach;in bed;with bed alarm set Nurse Communication: Mobility status (L foot drainage) PT Visit Diagnosis: Difficulty in walking, not elsewhere classified (R26.2);Muscle weakness (generalized) (M62.81)     Time: 0051-1021 PT Time Calculation (min) (ACUTE ONLY): 23 min  Charges:  $Gait Training: 8-22 mins $Therapeutic Activity: 8-22 mins                    Chesley Noon, PTA 05/10/21, 4:16 PM , 4:13 PM

## 2021-05-10 NOTE — Op Note (Signed)
05/10/2021  1:16 PM  PATIENT:  Stacie [name]   MRN: [id number]   PRE-OPERATIVE DIAGNOSIS:  closed wedge compression fracture of L2-3 and 4   POST-OPERATIVE DIAGNOSIS:  closed wedge compression fracture of L2-3 and 4   PROCEDURE:  Procedure(s): KYPHOPLASTY L2-3 and 4  SURGEON: Laurene Footman, MD   ASSISTANTS: None   ANESTHESIA:   local and MAC   EBL:  No intake/output data recorded.   BLOOD ADMINISTERED:none   DRAINS: none    LOCAL MEDICATIONS USED:  MARCAINE    and XYLOCAINE    SPECIMEN: L2 and L3 vertebral body biopsy   DISPOSITION OF SPECIMEN: Pathology   COUNTS:  YES   TOURNIQUET:  * No tourniquets in log *   IMPLANTS: Bone cement   DICTATION: .Dragon Dictation  patient was brought to the operating room and after adequate anesthesia was obtained the patient was placed prone.  C arm was brought in in good visualization of the affected level obtained on both AP and lateral projections.  After patient identification and timeout procedures were completed, local anesthetic was infiltrated with 10 cc 1% Xylocaine infiltrated subcutaneously.  This is done the area on the each side of the planned approach.  The back was then prepped and draped in the usual sterile manner and repeat timeout procedure carried out.  A spinal needle was brought down to the pedicle on the right side of L2 and L4 and the left side of L3, and a 50-50 mix of 1% Xylocaine half percent Sensorcaine with epinephrine total of 20 cc injected on each side.  After allowing this to set a small incision was made and the trocar was advanced into the vertebral body in an extrapedicular fashion.  Biopsy was obtained at L2 and 3 but nothing obtained at L4 drilling was carried out balloon inserted with inflation to 3 cc on the right at L2 and 4 cc at L4 and 3 cc on the left at L3.  When the cement was appropriate consistency 3-1/2 cc were injected on the right at L2 and 5 cc at L4 and 4 cc on the left into the vertebral  body without extravasation, good fill superior to inferior endplates and from right to left sides along the inferior endplate.  After the cement had set the trochar was removed and permanent C-arm views obtained.  The wound was closed with Dermabond followed by Band-Aid   PLAN OF CARE: Continue as inpatient   PATIENT DISPOSITION:  PACU - hemodynamically stable.

## 2021-05-10 NOTE — TOC Progression Note (Signed)
Transition of Care [facility]) - Progression Note    Patient Details  Name: Stacie [name] MRN: [id number] Date of Birth: Aug 01, 1956  Transition of Care [facility]) CM/SW Contact  Shelbie Hutching, RN Phone Number: 05/10/2021, 1:34 PM  Clinical Narrative:    RNCM met with patient at the bedside this morning.  Patient scheduled for kyphoplasty today.  Patient still would like to hole off on setting up home health services, she is hoping [place] go to the beach at the end of the week with her family.  RNCM informed patient that she can always have her PCP write for [facility] if she thinks she needs it once she gets home.    RNCM did inform patient that her kyphoplasty will likely not be covered by her insurance on this admission as it is not the reason she was admitted.  Patient verbalizes understanding of this and wants to proceed with the procedure.     Expected Discharge Plan: Home/Self Care Barriers to Discharge: Continued Medical Work up  Expected Discharge Plan and Services Expected Discharge Plan: Home/Self Care   Discharge Planning Services: CM Consult   Living arrangements for the past 2 months: Single Family Home                 DME Arranged: N/A DME Agency: NA       HH Arranged: Refused [place] Agency: NA         Social Determinants of Health (SDOH) Interventions    Readmission Risk Interventions Readmission Risk Prevention Plan 05/07/2021 07/17/2020  Transportation Screening Complete Complete  PCP or Specialist Appt within 3-5 Days Complete Complete  HRI or [place] Complete Complete  Social Work Consult for Enterprise Planning/Counseling Complete -  Palliative Care Screening Complete Complete  Medication Review Press photographer) Complete Complete  Some recent data might be hidden

## 2021-05-10 NOTE — Anesthesia Preprocedure Evaluation (Signed)
Anesthesia Evaluation  Patient identified by MRN, date of birth, ID band Patient awake    Reviewed: Allergy & Precautions, H&P , NPO status , reviewed documented beta blocker date and time   Airway Mallampati: II   Neck ROM: limited  Mouth opening: Limited Mouth Opening  Dental  (+) Teeth Intact, Caps   Pulmonary shortness of breath and Long-Term Oxygen Therapy, asthma , sleep apnea and Oxygen sleep apnea , pneumonia, resolved, Recent URI , Resolved,  Pulm HTN 2 to [place], home O2, prostaglandin infusion   Pulmonary exam normal        Cardiovascular hypertension, (-) angina+ Peripheral Vascular Disease  (-) Past MI Normal cardiovascular exam(-) dysrhythmias + Valvular Problems/Murmurs   06/2020 ECHO IMPRESSIONS   1. No evidence of thrombus.  2. Left ventricular ejection fraction, by estimation, is 35 to 40%. The  left ventricle has mild to moderately decreased function. The left  ventricle demonstrates global hypokinesis. Left ventricular diastolic  parameters are consistent with Grade III  diastolic dysfunction (restrictive).  3. Right ventricular systolic function is normal. The right ventricular  size is normal.  4. The mitral valve is normal in structure. No evidence of mitral valve  regurgitation.  5. The aortic valve is grossly normal. Aortic valve regurgitation is not  visualized.    Neuro/Psych PSYCHIATRIC DISORDERS Depression CVA    GI/Hepatic Neg liver ROS, GERD  Controlled,  Endo/Other  Hypothyroidism Hyperthyroidism   Renal/GU Renal disease     Musculoskeletal   Abdominal   Peds  Hematology  (+) Blood dyscrasia, anemia ,   Anesthesia Other Findings Past Medical History: No date: Acute respiratory failure with hypoxia (HCC) No date: Arrhythmia     Comment:  patient unaware if this is current No date: Asthma No date: Chronic kidney disease No date: Depression No date: GERD (gastroesophageal  reflux disease) No date: Heart murmur No date: History of kidney stones No date: HOH (hard of hearing)     Comment:  wear aids No date: Hyperthyroidism No date: Hypothyroidism No date: IBS (irritable bowel syndrome) No date: Pneumonia No date: Pulmonary hypertension ([place]) No date: Sarcoid No date: Sarcoidosis No date: Seasonal allergies CPAP with O2: Sleep apnea 07/2020: Stroke ([place])     Comment:  watershed No date: Wears hearing aid in both ears  Past Surgical History: No date: ABDOMINAL HYSTERECTOMY     Comment:  partial 10/18/2018: CARDIAC CATHETERIZATION     Comment:  [facility] 07/31/2019: CATARACT EXTRACTION W/PHACO; Left     Comment:  Procedure: CATARACT EXTRACTION PHACO AND INTRAOCULAR               LENS PLACEMENT (IOC) LEFT 00:51.1  17.9%  9.15;  Surgeon:              Leandrew Koyanagi, MD;  Location: [place];              Service: Ophthalmology;  Laterality: Left;  keep this               patient second No date: COLON SURGERY     Comment:  "colon was fused to bladder - operated on both" 09/18/2007: COLONOSCOPY     Comment:  diverticuli, no polyps 05/26/2010: COLONOSCOPY     Comment:  diverticuli, no polyps 07/14/2020: CYSTOSCOPY WITH STENT PLACEMENT; Bilateral     Comment:  Procedure: [place],               RETROPYLOGRAM;  Surgeon: Billey Co, MD;  Location:              [facility];  Service: Urology;  Laterality: Bilateral; 02/20/2020: CYSTOSCOPY/URETEROSCOPY/HOLMIUM LASER/STENT PLACEMENT; Left     Comment:  Procedure: CYSTOSCOPY/URETEROSCOPY/LITHOTRIPSY /STENT               PLACEMENT;  Surgeon: Hollice Espy, MD;  Location: [facility]              ORS;  Service: Urology;  Laterality: Left; 10/02/2020: CYSTOSCOPY/URETEROSCOPY/HOLMIUM LASER/STENT PLACEMENT;  Bilateral     Comment:  Procedure: CYSTOSCOPY/URETEROSCOPY/HOLMIUM LASER/STENT               PLACEMENT;  Surgeon: Billey Co, MD;  Location:               [facility];   Service: Urology;  Laterality: Bilateral; No date: EYE SURGERY 05/20/2015: PARS PLANA VITRECTOMY; Right     Comment:  Procedure: PARS PLANA VITRECTOMY WITH 25 GAUGE, laser;                Surgeon: Milus Height, MD;  Location: [facility];                Service: Ophthalmology;  Laterality: Right; 1990: PARTIAL HYSTERECTOMY 07/27/2020: TEE WITHOUT CARDIOVERSION; N/A     Comment:  Procedure: TRANSESOPHAGEAL ECHOCARDIOGRAM (TEE);                Surgeon: Teodoro Spray, MD;  Location: [facility];                Service: Cardiovascular;  Laterality: N/A; No date: TUBAL LIGATION No date: WISDOM TOOTH EXTRACTION     Reproductive/Obstetrics                             Anesthesia Physical  Anesthesia Plan  ASA: 4  Anesthesia Plan: General   Post-op Pain Management:    Induction: Intravenous  PONV Risk Score and Plan: 3 and Treatment may vary due to age or medical condition, TIVA, Propofol infusion, Ondansetron and Midazolam  Airway Management Planned: Natural Airway and Simple Face Mask  Additional Equipment:   Intra-op Plan:   Post-operative Plan:   Informed Consent: I have reviewed the patients History and Physical, chart, labs and discussed the procedure including the risks, benefits and alternatives for the proposed anesthesia with the patient or authorized representative who has indicated his/her understanding and acceptance.     Dental Advisory Given  Plan Discussed with: CRNA  Anesthesia Plan Comments: (Poss LMA if required)        Anesthesia Quick Evaluation

## 2021-05-10 NOTE — Progress Notes (Signed)
Per Dr Broadus John ok to give morning prednisone PO, moved other daily PO meds to this afternoon, ok per Dr Broadus John

## 2021-05-10 NOTE — Progress Notes (Signed)
PROGRESS NOTE    Stacie [name]  MCE:[id number] DOB: [birth year] DOA: 05/06/2021 PCP: Glean Hess, MD  Brief Narrative:Stacie [name] is a chronically ill female with history of pulmonary sarcoidosis on chronic prednisone, methotrexate, ILD, chronic respiratory failure on 2 to 3 L home O2, pulmonary hypertension, CKD stage III, history of CVA, hypothyroidism, nephrolithiasis, history of left foot gangrene status post ray and second metatarsal head amputation in 3/22, she has chronic wounds that are dressed daily by her spouse, OSA on CPAP presented to the ED for evaluation after 2 syncopal episodes, she has been having low back pain for the last 1 month, worse in the last week.  Due to have elective kyphoplasty this upcoming week per Dr.Menz for lumbar compression fractures.  Had 2 witnessed syncopal episodes on her bed 7/21 where she lost consciousness, subsequently brought to the ED.  -She was noted to be hypotensive(BP 80s), creatinine was mildly elevated at 1.5 and white count was 24K   Assessment & Plan:   Syncope Hypotension -Syncopal event secondary to hypotension -Blood pressure stabilized after IV fluids, antibiotics -Etiology likely dehydration, meds/vasodilators, initially there was a concern for infection -WBC was 24K on admission, blood cultures negative x2 days, leukocytosis improving, on empiric vancomycin and cefepime -Treated with higher dose prednisone for 2 days, transitioned back to 20 mg daily, blood pressure stable -No events on telemetry -Increase activity with physical therapy as tolerated  Chronic foot wounds/leukocytosis H/o L foot Osteomyelitis 4/22 -Left foot underwent first toe amputation and resection of part of the first and second metatarsal head in March 22, MRI in April was concerning for osteomyelitis, she was treated with 6 weeks of IV daptomycin and ceftriaxone on 5/24 -X-rays noted cortical irregularity in both feet -In setting of chronic wounds  and leukocytosis,  ESR was 117 and CRP was 30, this was concerning, hence MRI L foot obtained 7/23, this is reassuring, substantial improvement noted compared to MRI in March, no marrow edema noted, some inflammatory changes noted in the of overlying soft tissue -Appreciate input from vascular surgeon Dr. Lucky Cowboy, chronic wounds are felt to be stable without evidence of active infection -Requested ID input, seen by Dr. Delaine Lame, no concern for active infectious process, plan to discontinue antibiotics tomorrow  Pulmonary sarcoidosis/interstitial lung disease Pulmonary hypertension Chronic respiratory failure Chronic immunosuppression -On 2 to 3 L home O2 at baseline -On baseline prednisone 20 mg daily which was doubled, back to 20 mg daily, -resume methotrexate at discharge -Continue Symbicort, Tyvaso (treprostinil), albuterol PRN -given lasix x1 2 days ago  Subacute low back pain L2-L3-L4 compression fractures -L2 appears more subacute -Continue Flexeril, PRN hydrocodone, lidocaine patch -For kyphoplasty this afternoon -PT eval tomorrow  Acute kidney injury -Secondary to dehydration, hypotension, resolved  DVT prophylaxis: SCDs, hold heparin Code Status: Full code Family Communication: Discussed with spouse Dr. Kathyrn Sheriff yesterday, will update him today Disposition Plan:  Status is: Inpatient  Remains inpatient appropriate because:Inpatient level of care appropriate due to severity of illness  Dispo: The patient is from: Home              Anticipated d/c is to: Home              Patient currently is not medically stable to d/c.   Difficult to place patient No   Consultants:  Vascular surgery Dr. Driscilla Moats Dr.Menz ID d/w Dr.Comer  Procedures:   Antimicrobials:    Subjective: -Having more back pain today, denies any dyspnea, plan for kyphoplasty  early afternoon  Objective: Vitals:   05/10/21 0631 05/10/21 0836 05/10/21 1133 05/10/21 1321  BP: 135/81 130/74 (!) 153/86  (!) 112/58  Pulse: 85 89 95 98  Resp: _0 (!) 25  Temp: 98.1 F (36.7 C) 98.1 F (36.7 C) 98.4 F (36.9 C) 98.2 F (36.8 C)  TempSrc:  Oral Oral   SpO2: 99% 99% 97% 97%  Weight:   76.2 kg   Height:   _1  (1.702 m)     Intake/Output Summary (Last 24 hours) at 05/10/2021 1407 Last data filed at 05/10/2021 1312 Gross per 24 hour  Intake 974.38 ml  Output 2 ml  Net 972.38 ml   Filed Weights   05/06/21 1053 05/10/21 1133  Weight: 76.2 kg 76.2 kg    Examination:  General exam: Pleasant chronically ill female sitting up in bed, AAOx3, nondistressed CVS: S1-S2, regular rate rhythm Lungs: Improved air movement, no wheezes today, decreased breath sounds bases CVS: S1-S2, regular rate rhythm Abdomen: Soft, nontender, bowel sounds present Extremities :Status post right great toe tip wound with ulcer, minimal purulence, no erythema or tenderness  Left foot with first toe and second metatarsal amputation, wound site with scant to moderate purulence, no surrounding erythema tenderness or warmth  Skin: As above Psych: Appropriate mood and affect   Data Reviewed:   CBC: Recent Labs  Lab 05/06/21 1119 05/07/21 0452 05/08/21 0430 05/09/21 0535 05/10/21 0552  WBC 24.7* 16.3* 14.3* 12.5* 11.1*  HGB 11.8* 10.8* 11.3* 10.8* 11.1*  HCT 36.6 33.6* 34.7* 33.0* 34.9*  MCV 95.6 96.3 96.1 95.4 95.1  PLT 295 275 296 321 670   Basic Metabolic Panel: Recent Labs  Lab 05/06/21 1119 05/07/21 0452 05/08/21 0430 05/09/21 0535 05/10/21 0552  NA 137 138 141 139 139  K 3.5 3.8 5.0 4.1 4.3  CL 95* 100 102 97* 99  CO2 29 29 33* 31 33*  GLUCOSE 124* 112* 122* 114* 86  BUN 29* 27* 27* 36* 37*  CREATININE 1.56* 0.98 1.10* 1.09* 1.09*  CALCIUM 8.9 9.0 9.4 9.5 9.3   GFR: Estimated Creatinine Clearance: 54 mL/min (A) (by C-G formula based on SCr of 1.09 mg/dL (H)). Liver Function Tests: Recent Labs  Lab 05/06/21 1119 05/07/21 0452 05/08/21 0430  AST _2 ALT _3 ALKPHOS 67 60 73  BILITOT 1.1 0.9 0.8  PROT 6.9 6.6 6.6  ALBUMIN 3.1* 3.0* 2.7*   Recent Labs  Lab 05/06/21 1119  LIPASE 21   No results for input(s): AMMONIA in the last 168 hours. Coagulation Profile: No results for input(s): INR, PROTIME in the last 168 hours. Cardiac Enzymes: No results for input(s): CKTOTAL, CKMB, CKMBINDEX, TROPONINI in the last 168 hours. BNP (last 3 results) No results for input(s): PROBNP in the last 8760 hours. HbA1C: No results for input(s): HGBA1C in the last 72 hours. CBG: No results for input(s): GLUCAP in the last 168 hours. Lipid Profile: No results for input(s): CHOL, HDL, LDLCALC, TRIG, CHOLHDL, LDLDIRECT in the last 72 hours. Thyroid Function Tests: No results for input(s): TSH, T4TOTAL, FREET4, T3FREE, THYROIDAB in the last 72 hours. Anemia Panel: No results for input(s): VITAMINB12, FOLATE, FERRITIN, TIBC, IRON, RETICCTPCT in the last 72 hours. Urine analysis:    Component Value Date/Time   COLORURINE STRAW (A) 05/06/2021 1616   APPEARANCEUR CLEAR (A) 05/06/2021 1616   APPEARANCEUR Clear 04/21/2021 1551   LABSPEC 1.010 05/06/2021 1616   PHURINE 8.0 05/06/2021 1616   GLUCOSEU NEGATIVE  05/06/2021 1616   HGBUR SMALL (A) 05/06/2021 1616   BILIRUBINUR NEGATIVE 05/06/2021 1616   BILIRUBINUR Negative 04/21/2021 1551   KETONESUR NEGATIVE 05/06/2021 1616   PROTEINUR NEGATIVE 05/06/2021 1616   UROBILINOGEN 0.2 01/07/2020 1406   UROBILINOGEN 0.2 11/25/2014 1849   NITRITE NEGATIVE 05/06/2021 1616   LEUKOCYTESUR NEGATIVE 05/06/2021 1616   Sepsis Labs: _0 (procalcitonin:4,lacticidven:4)  ) Recent Results (from the past 240 hour(s))  Resp Panel by RT-PCR (Flu A&B, Covid) Nasopharyngeal Swab     Status: None   Collection Time: 05/06/21  2:03 PM   Specimen: Nasopharyngeal Swab; Nasopharyngeal(NP) swabs in vial transport medium  Result Value Ref Range Status   SARS Coronavirus 2 by RT PCR NEGATIVE NEGATIVE Final    Comment:  (NOTE) SARS-CoV-2 target nucleic acids are NOT DETECTED.  The SARS-CoV-2 RNA is generally detectable in upper respiratory specimens during the acute phase of infection. The lowest concentration of SARS-CoV-2 viral copies this assay can detect is 138 copies/mL. A negative result does not preclude SARS-Cov-2 infection and should not be used as the sole basis for treatment or other patient management decisions. A negative result may occur with  improper specimen collection/handling, submission of specimen other than nasopharyngeal swab, presence of viral mutation(s) within the areas targeted by this assay, and inadequate number of viral copies(<138 copies/mL). A negative result must be combined with clinical observations, patient history, and epidemiological information. The expected result is Negative.  Fact Sheet for Patients:  EntrepreneurPulse.com.au  Fact Sheet for Healthcare Providers:  IncredibleEmployment.be  This test is no t yet approved or cleared by the [place] FDA and  has been authorized for detection and/or diagnosis of SARS-CoV-2 by FDA under an Emergency Use Authorization (EUA). This EUA will remain  in effect (meaning this test can be used) for the duration of the COVID-19 declaration under Section 564(b)(1) of the Act, 21 U.S.C.section 360bbb-3(b)(1), unless the authorization is terminated  or revoked sooner.       Influenza A by PCR NEGATIVE NEGATIVE Final   Influenza B by PCR NEGATIVE NEGATIVE Final    Comment: (NOTE) The Xpert Xpress SARS-CoV-2/FLU/RSV plus assay is intended as an aid in the diagnosis of influenza from Nasopharyngeal swab specimens and should not be used as a sole basis for treatment. Nasal washings and aspirates are unacceptable for Xpert Xpress SARS-CoV-2/FLU/RSV testing.  Fact Sheet for Patients: EntrepreneurPulse.com.au  Fact Sheet for Healthcare  Providers: IncredibleEmployment.be  This test is not yet approved or cleared by the [place] FDA and has been authorized for detection and/or diagnosis of SARS-CoV-2 by FDA under an Emergency Use Authorization (EUA). This EUA will remain in effect (meaning this test can be used) for the duration of the COVID-19 declaration under Section 564(b)(1) of the Act, 21 U.S.C. section 360bbb-3(b)(1), unless the authorization is terminated or revoked.  Performed at [facility], [address] [place], [place] [address]   Blood culture (routine x 2)     Status: None (Preliminary result)   Collection Time: 05/06/21  2:12 PM   Specimen: BLOOD  Result Value Ref Range Status   Specimen Description BLOOD BLOOD RIGHT ARM  Final   Special Requests   Final    BOTTLES DRAWN AEROBIC AND ANAEROBIC Blood Culture adequate volume   Culture   Final    NO GROWTH 4 DAYS Performed at [facility], [address] [place], [place] [address]    Report Status PENDING  Incomplete  Blood culture (routine x 2)  Status: None (Preliminary result)   Collection Time: 05/06/21  2:12 PM   Specimen: BLOOD  Result Value Ref Range Status   Specimen Description BLOOD LEFT ANTECUBITAL  Final   Special Requests   Final    BOTTLES DRAWN AEROBIC AND ANAEROBIC Blood Culture results may not be optimal due to an inadequate volume of blood received in culture bottles   Culture   Final    NO GROWTH 4 DAYS Performed at [facility], [address], [place], [place] [address]    Report Status PENDING  Incomplete         Radiology Studies: DG Lumbar Spine 2-3 Views  Result Date: 05/10/2021 CLINICAL DATA:  Surgery, elective. Additional history provided: L2, L3, L4 kyphoplasty. Provided fluoroscopy time: 2 minutes, 16 seconds. EXAM: LUMBAR SPINE - 2-3 VIEW; DG C-ARM 1-60 MIN COMPARISON:  Lumbar spine MRI 05/07/2021. FINDINGS: Twelve intraprocedural fluoroscopic images of  the lumbar spine are submitted. On the provided images, there are findings of interval kyphoplasty at what are presumably the L2, L3 and L4 levels (given the provided history). IMPRESSION: Twelve intraprocedural fluoroscopic images of the lumbar spine from reported L2, L3 and L4 kyphoplasty. Electronically Signed   By: Kellie Simmering DO   On: 05/10/2021 13:53   MR FOOT LEFT W WO CONTRAST  Result Date: 05/08/2021 CLINICAL DATA:  Foot swelling, diabetic, osteomyelitis suspected. History of pulmonary sarcoidosis on chronic prednisone, methotrexate, ILD, chronic respiratory failure on 2 to 3 L home O2, pulmonary hypertension, CKD stage III, history of CVA, hypothyroidism, nephrolithiasis, history of left foot gangrene status post amputation in March 2022. Chronic wounds. EXAM: MRI OF THE LEFT FOREFOOT WITHOUT AND WITH CONTRAST TECHNIQUE: Multiplanar, multisequence MR imaging of the left forefoot was performed both before and after administration of intravenous contrast. CONTRAST:  7.51m GADAVIST GADOBUTROL 1 MMOL/ML IV SOLN COMPARISON:  Multiple exams, including 05/06/2021 and 02/03/2021 FINDINGS: Despite efforts by the technologist and patient, motion artifact is present on today's exam and could not be eliminated. This reduces exam sensitivity and specificity. Bones/Joint/Cartilage Prior amputation of the first digit at the level of the metatarsal head and second digit at the level of the distal metatarsal metaphysis. Prior amputation of the third digit at the level of the MTP joint. Substantial improvement in the marrow edema and enhancement along the distal margin of the first metatarsal, with only a thin rim of peripheral accentuated T2 signal along the distal margin of the first metatarsal as shown on image 11 series 9. Similar improved and nearly resolved edema in the head of the third metatarsal and along the distal margin of the second metatarsal the amputation site. Subtle residual endosteal edema primarily  along the distal margins of the first metatarsal margin and head of the third metatarsal, but not a characteristic appearance for active osteomyelitis. Ligaments Lisfranc ligament intact Muscles and Tendons Mild flexor hallucis longus tenosynovitis just proximal to the knot of Henry. Low-level edema along the flexor digitorum tendons distally. Soft tissues Substantial improvement in the edema and enhancement along the amputation flap. There still some moderate residual accentuated enhancement suggesting inflammatory findings in the flap overlying the first and second metatarsal heads, with associated low T1 signal and overlying bandaging. However, I not see a drainable fluid collection nor do I see obvious substantial hypoenhancing regions at in this area to indicate abscess or necrotic soft tissues. IMPRESSION: 1. Substantial improvement compared to 02/03/2021, with near complete resolution of the marrow edema previously along the distal margin of  the first metatarsal and in the third metatarsal head, and only with some subtle residual endosteal edema in these regions. No active osteomyelitis currently identified. 2. Inflammatory findings along the flap overlying the first and second and to a lesser extent third metatarsal margins, with abnormal edema signal and enhancement but no hypoenhancement characteristic of necrotic tissue, and no appreciable drainable abscess. 3. Mild flexor hallucis longus tenosynovitis and edema tracking along the flexor digitorum tendons. Electronically Signed   By: Van Clines M.D.   On: 05/08/2021 17:08   DG C-Arm 1-60 Min  Result Date: 05/10/2021 CLINICAL DATA:  Surgery, elective. Additional history provided: L2, L3, L4 kyphoplasty. Provided fluoroscopy time: 2 minutes, 16 seconds. EXAM: LUMBAR SPINE - 2-3 VIEW; DG C-ARM 1-60 MIN COMPARISON:  Lumbar spine MRI 05/07/2021. FINDINGS: Twelve intraprocedural fluoroscopic images of the lumbar spine are submitted. On the provided  images, there are findings of interval kyphoplasty at what are presumably the L2, L3 and L4 levels (given the provided history). IMPRESSION: Twelve intraprocedural fluoroscopic images of the lumbar spine from reported L2, L3 and L4 kyphoplasty. Electronically Signed   By: Kellie Simmering DO   On: 05/10/2021 13:53        Scheduled Meds:  [PFY Hold] aspirin EC  81 mg Oral Daily   [MAR Hold] B-complex with vitamin C  1 tablet Oral Daily   [MAR Hold] cyclobenzaprine  5 mg Oral TID   [MAR Hold] dorzolamide  1 drop Both Eyes BID   [MAR Hold] escitalopram  20 mg Oral Daily   [MAR Hold] fluticasone furoate-vilanterol  1 puff Inhalation Daily   [MAR Hold] folic acid  1 mg Oral Daily   [MAR Hold] latanoprost  1 drop Right Eye QHS   [MAR Hold] levothyroxine  137 mcg Oral QAC breakfast   [MAR Hold] lidocaine  2 patch Transdermal Q24H   [MAR Hold] multivitamin with minerals  1 tablet Oral Daily   [MAR Hold] predniSONE  20 mg Oral Q breakfast   [MAR Hold] senna-docusate  1 tablet Oral BID   [MAR Hold] Treprostinil  72 mcg Inhalation QID   Continuous Infusions:  [MAR Hold] sodium chloride 0 mL/hr at 05/09/21 1054   [MAR Hold] ceFEPime (MAXIPIME) IV 2 g (05/10/21 0752)   [MAR Hold] vancomycin 1,250 mg (05/09/21 1721)     LOS: 4 days    Time spent: 66mn   PDomenic Polite MD [facility]   05/10/2021, 2:07 PM

## 2021-05-10 NOTE — Progress Notes (Signed)
Patient well-known to me.  Has chronic left foot wounds and right great toe wounds.  Admitted with back pain and other issues including hypotension.  Original x-ray had some concern for infection but MRI looked improved from previous and the wound looked quite good.  Right toe wound was also stable.  Neither appears infected.  We think it would be okay to proceed with kyphoplasty continue local wound care to both feet.  Discussed with patient and her husband.

## 2021-05-10 NOTE — Progress Notes (Signed)
Plan on kyphoplasty today at L 234 for compression fractures. Having persistent pain.

## 2021-05-10 NOTE — Transfer of Care (Signed)
Immediate Anesthesia Transfer of Care Note  Patient: Stacie [name]  Procedure(s) Performed: KYPHOPLASTY, L2, L3, L4  Patient Location: PACU  Anesthesia Type:MAC  Level of Consciousness: awake and alert   Airway & Oxygen Therapy: Patient Spontanous Breathing and Patient connected to nasal cannula oxygen  Post-op Assessment: Report given to RN and Post -op Vital signs reviewed and stable  Post vital signs: Reviewed and stable  Last Vitals:  Vitals Value Taken Time  BP    Temp    Pulse    Resp    SpO2      Last Pain:  Vitals:   05/10/21 1133  TempSrc: Oral  PainSc: 10-Worst pain ever      Patients Stated Pain Goal: 0 (40/35/24 8185)  Complications: No notable events documented.

## 2021-05-10 NOTE — Consult Note (Addendum)
NAME: Stacie [name]  DOB: 09-21-1956  MRN: [id number]  Date/Time: 05/10/2021 10:50 AM  REQUESTING PROVIDER: Dr.Joseph Subjective:  REASON FOR CONSULT: Foot wound ? Stacie [name] is a 65 y.o. 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 As per patient and Her husband Stacie [name] she has had lower back pain for 1 month- She tripped and landed on her back, having back pain- initially thought to be muscle strain and she took NSAID- As she has a history of renal calculi she saw urology on 04/21/21  and was prescribed cipro for possible UTI- imaging did not show any stones  Urine culture came back negative- So a CT renal scan was done on 04/27/21. It showed Interval compression of L2, L3 and L4 vertebral levels since previous imaging from October with approximately 10-20% loss of [name] in the central aspect of the L1 superior endplate. 20-30% loss of [name] at the L2 superior endplate and approximately 30-40% loss of [name] at the L3 superior endplate.She has been taking Vicodin/NSAID for it  . She saw Stacie [name] as OP and the plan was to do kyphoplasty on 05/10/21. Meanwhile pt became lethargic, was out of it and passed out in chair while seated twice- each lasting 15 seconds . EMS was called and she and came to the ED . She also had some nasal congestion and poor appetite. She did not have any vomiting, diarrhea, abdominal pain. She is immunecompromise don methotrexate and steroids In the ED vitals BP 86/52, HR 103, Temp 98.2 and  WBC 24.7, Hb 11.8, PLT 295, cr 1.56 CXR was normal, CT head was no acue findings Imaging of the left foot did not show any osteo She was started on Iv vanco and cefepime after blood culture sent and she was also started on IV fluids. She was admitted as SIRS. , dehydration, AKI due to dehydration. MRI of  the lumbar spine showed L2-L4 compression. MRI of left foot Substantial improvement compared to 02/03/2021, with near complete resolution of the marrow edema previously along the distal margin of the first metatarsal and in the third metatarsal head, and only with some subtle residual endosteal edema in these regions. No active osteomyelitis currently identified.  Pt has complicated medical and infectious history Sept 2021- Septic shock due to Kleb bacteremia , complicated UTI  b/l ureteric stones, rt hydro, s/p stents on 07/15/20.CVA b/l frontal lobe acute/subacute infarcts TEE neg.B/l feet gangrene  08/14/20  Autoamputation of 2nd and 3 rd toes Then on 01/14/21 Left first toe ray toe amputation, resection of second left metatarsal head February 01, 2021-      treated for MRSE bacteremia and osteo of left foot  with dapto and ceftriaxone for 6 weeks . Completed on 03/09/21  Past Medical History:  Diagnosis Date   Acute respiratory failure with hypoxia [facility])    Arrhythmia    patient unaware if this is current   Asthma    Chronic kidney disease    Depression    GERD (gastroesophageal reflux disease)    Heart murmur    History of kidney stones    HOH (hard of hearing)    wear aids   Hyperthyroidism    Hypothyroidism    IBS (irritable bowel syndrome)    Pneumonia    Pulmonary hypertension ([place])    Sarcoid  Sarcoidosis    Seasonal allergies    Sleep apnea CPAP with O2   Stroke ([place]) 07/2020   [place]   Wears hearing aid in both ears     Past Surgical History:  Procedure Laterality Date   ABDOMINAL HYSTERECTOMY     partial   AMPUTATION Left 01/14/2021   Procedure: AMPUTATION RAY (1ST TOE ) ( 2ND METATARSAL HEAD RESECTION);  Surgeon: Stacie Huxley, MD;  Location: [facility];  Service: Vascular;  Laterality: Left;   CARDIAC CATHETERIZATION  10/18/2018   [facility]   CATARACT EXTRACTION W/PHACO Left 07/31/2019   Procedure: CATARACT EXTRACTION PHACO AND INTRAOCULAR LENS PLACEMENT  (IOC) LEFT 00:51.1  17.9%  9.15;  Surgeon: Stacie Koyanagi, MD;  Location: [place];  Service: Ophthalmology;  Laterality: Left;  keep this patient second   COLON SURGERY     "colon was fused to bladder - operated on both"   COLONOSCOPY  09/18/2007   diverticuli, no polyps   COLONOSCOPY  05/26/2010   diverticuli, no polyps   CYSTOSCOPY WITH STENT PLACEMENT Bilateral 07/14/2020   Procedure: CYSTOSCOPY WITH STENT PLACEMENT, RETROPYLOGRAM;  Surgeon: Stacie Co, MD;  Location: [facility];  Service: Urology;  Laterality: Bilateral;   CYSTOSCOPY/URETEROSCOPY/HOLMIUM LASER/STENT PLACEMENT Left 02/20/2020   Procedure: CYSTOSCOPY/URETEROSCOPY/LITHOTRIPSY /STENT PLACEMENT;  Surgeon: Stacie Espy, MD;  Location: [facility];  Service: Urology;  Laterality: Left;   CYSTOSCOPY/URETEROSCOPY/HOLMIUM LASER/STENT PLACEMENT Bilateral 10/02/2020   Procedure: CYSTOSCOPY/URETEROSCOPY/HOLMIUM LASER/STENT PLACEMENT;  Surgeon: Stacie Co, MD;  Location: [facility];  Service: Urology;  Laterality: Bilateral;   EYE SURGERY     PARS PLANA VITRECTOMY Right 05/20/2015   Procedure: PARS PLANA VITRECTOMY WITH 25 GAUGE, laser;  Surgeon: Stacie Height, MD;  Location: [facility];  Service: Ophthalmology;  Laterality: Right;   PARTIAL HYSTERECTOMY  1990   TEE WITHOUT CARDIOVERSION N/A 07/27/2020   Procedure: TRANSESOPHAGEAL ECHOCARDIOGRAM (TEE);  Surgeon: Stacie Spray, MD;  Location: [facility];  Service: Cardiovascular;  Laterality: N/A;   TUBAL LIGATION     WISDOM TOOTH EXTRACTION      Social History   Socioeconomic History   Marital status: Married    Spouse name: Not on file   Number of children: 1   Years of education: Not on file   Highest education level: Not on file  Occupational History   Occupation: Homemaker  Tobacco Use   Smoking status: Never   Smokeless tobacco: Never  Vaping Use   Vaping Use: Never used  Substance and Sexual Activity   Alcohol use: No   Drug use: No   Sexual  activity: Not on file  Other Topics Concern   Not on file  Social History Narrative   Not on file   Social Determinants of Health   Financial Resource Strain: Not on file  Food Insecurity: Not on file  Transportation Needs: Not on file  Physical Activity: Not on file  Stress: Not on file  Social Connections: Not on file  Intimate Partner Violence: Not on file    Family History  Problem Relation Age of Onset   Allergies Father    Asthma Father    Colon cancer Father    Allergies Brother    Asthma Brother    Breast cancer Maternal Grandmother    Allergies  Allergen Reactions   Nitrofurantoin Nausea Only   Plaquenil [Hydroxychloroquine]    Tramadol Nausea And Vomiting   Sulfa Antibiotics Rash    As an infant   I? Current Facility-Administered Medications  Medication  Dose Route Frequency Provider Last Rate Last Admin   0.9 %  sodium chloride infusion   Intravenous PRN Domenic Polite, MD   Stopped at 05/09/21 1054   acetaminophen (TYLENOL) tablet 500 mg  500 mg Oral Q6H PRN Imagene Sheller S, DO       albuterol (PROVENTIL) (2.5 MG/3ML) 0.083% nebulizer solution 2.5 mg  2.5 mg Inhalation Q6H PRN Imagene Sheller S, DO   2.5 mg at 05/07/21 0950   aspirin EC tablet 81 mg  81 mg Oral Daily Imagene Sheller S, DO   81 mg at 05/09/21 3790   B-complex with vitamin C tablet 1 tablet  1 tablet Oral Daily Imagene Sheller S, DO   1 tablet at 05/09/21 0858   ceFEPIme (MAXIPIME) 2 g in sodium chloride 0.9 % 100 mL IVPB  2 g Intravenous Q12H Anwar, Shayan S, DO 200 mL/hr at 05/10/21 0752 2 g at 05/10/21 0752   cyclobenzaprine (FLEXERIL) tablet 5 mg  5 mg Oral TID Domenic Polite, MD   5 mg at 05/09/21 2311   dorzolamide (TRUSOPT) 2 % ophthalmic solution 1 drop  1 drop Both Eyes BID Imagene Sheller S, DO   1 drop at 05/10/21 0757   escitalopram (LEXAPRO) tablet 20 mg  20 mg Oral Daily Imagene Sheller S, DO   20 mg at 05/09/21 0858   fluticasone furoate-vilanterol (BREO ELLIPTA) 100-25 MCG/INH 1 puff  1  puff Inhalation Daily Imagene Sheller S, DO   1 puff at 24/09/73 5329   folic acid (FOLVITE) tablet 1 mg  1 mg Oral Daily Anwar, Bonnee Quin S, DO   1 mg at 05/09/21 0858   HYDROcodone-acetaminophen (NORCO/VICODIN) 5-325 MG per tablet 1 tablet  1 tablet Oral Q6H PRN Imagene Sheller S, DO   1 tablet at 05/10/21 0755   ketorolac (TORADOL) 15 MG/ML injection 15 mg  15 mg Intravenous Q6H PRN Domenic Polite, MD   15 mg at 05/09/21 1710   latanoprost (XALATAN) 0.005 % ophthalmic solution 1 drop  1 drop Right Eye QHS Imagene Sheller S, DO   1 drop at 05/09/21 2313   levothyroxine (SYNTHROID) tablet 137 mcg  137 mcg Oral QAC breakfast Imagene Sheller S, DO   137 mcg at 05/09/21 0541   lidocaine (LIDODERM) 5 % 2 patch  2 patch Transdermal Q24H Domenic Polite, MD   2 patch at 05/10/21 9242   multivitamin with minerals tablet 1 tablet  1 tablet Oral Daily Imagene Sheller S, DO   1 tablet at 05/09/21 0858   predniSONE (DELTASONE) tablet 20 mg  20 mg Oral Q breakfast Domenic Polite, MD   20 mg at 05/10/21 0755   senna-docusate (Senokot-S) tablet 1 tablet  1 tablet Oral BID Domenic Polite, MD   1 tablet at 05/09/21 2312   Treprostinil (TYVASO) inhalation solution 72 mcg  72 mcg Inhalation QID Imagene Sheller S, DO   72 mcg at 05/10/21 0759   vancomycin (VANCOREADY) IVPB 1250 mg/250 mL  1,250 mg Intravenous Q24H Darnelle Bos, RPH 166.7 mL/hr at 05/09/21 1721 1,250 mg at 05/09/21 1721     Abtx:  Anti-infectives (From admission, onward)    Start     Dose/Rate Route Frequency Ordered Stop   05/07/21 1600  vancomycin (VANCOCIN) IVPB 1000 mg/200 mL premix  Status:  Discontinued        1,000 mg 200 mL/hr over 60 Minutes Intravenous Every 24 hours 05/06/21 1621 05/07/21 0759   05/07/21 1600  vancomycin (VANCOREADY) IVPB 1250 mg/250 mL  1,250 mg 166.7 mL/hr over 90 Minutes Intravenous Every 24 hours 05/07/21 0759     05/07/21 0000  ceFEPIme (MAXIPIME) 2 g in sodium chloride 0.9 % 100 mL IVPB        2 g 200 mL/hr over  30 Minutes Intravenous Every 12 hours 05/06/21 1604     05/06/21 1630  vancomycin (VANCOREADY) IVPB 750 mg/150 mL        750 mg 150 mL/hr over 60 Minutes Intravenous  Once 05/06/21 1600 05/07/21 0323   05/06/21 1315  metroNIDAZOLE (FLAGYL) IVPB 500 mg        500 mg 100 mL/hr over 60 Minutes Intravenous  Once 05/06/21 1309 05/06/21 1609   05/06/21 1315  vancomycin (VANCOCIN) IVPB 1000 mg/200 mL premix        1,000 mg 200 mL/hr over 60 Minutes Intravenous  Once 05/06/21 1309 05/06/21 1934   05/06/21 1315  ceFEPIme (MAXIPIME) 2 g in sodium chloride 0.9 % 100 mL IVPB        2 g 200 mL/hr over 30 Minutes Intravenous  Once 05/06/21 1309 05/06/21 1519       REVIEW OF SYSTEMS:  Const: negative fever, negative chills, negative weight loss Eyes: negative diplopia or visual changes, negative eye pain ENT: negative coryza, negative sore throat Resp: congestion, negative cough, no hemoptysis, dyspnea Cards: negative for chest pain, palpitations, lower extremity edema GU: negative for frequency, dysuria and hematuria GI: Negative for abdominal pain, diarrhea, bleeding, constipation Skin: negative for rash and pruritus Heme: negative for easy bruising and gum/nose bleeding MS: as above Neurolo:syncope Psych: negative for feelings of anxiety, depression  Endocrine: hypothyroidism Allergy/Immunology- as above Objective:  VITALS:  BP 130/74 (BP Location: Left Arm)   Pulse 89   Temp 98.1 F (36.7 C) (Oral)   Resp 20   Ht 5' 7.01" (1.702 m)   Wt 76.2 kg   SpO2 99%   BMI 26.30 kg/m  PHYSICAL EXAM:  General: Alert, cooperative, no distress, appears stated age.  Head: Normocephalic, without obvious abnormality, atraumatic. Eyes: Conjunctivae clear, anicteric sclerae. Pupils are equal ENT Nares normal. No drainage or sinus tenderness. Lips, mucosa, and tongue normal. Oropharyngeal  Thrush Neck: Supple, symmetrical, no adenopathy, thyroid: non tender no carotid bruit and no JVD. Back: No  CVA tenderness. Lungs: b/l air entry - crepts bases. Heart: Regular rate and rhythm, no murmur, rub or gallop. Abdomen: Soft, non-tender,not distended. Bowel sounds normal. No masses Extremities: left foot     Skin: No rashes or lesions. Or bruising Lymph: Cervical, supraclavicular normal. Neurologic: Grossly non-focal Pertinent Labs Lab Results CBC    Component Value Date/Time   WBC 11.1 (H) 05/10/2021 0552   RBC 3.67 (L) 05/10/2021 0552   HGB 11.1 (L) 05/10/2021 0552   HGB 12.3 09/23/2020 1022   HCT 34.9 (L) 05/10/2021 0552   HCT 35.9 09/23/2020 1022   PLT 300 05/10/2021 0552   PLT 366 09/23/2020 1022   MCV 95.1 05/10/2021 0552   MCV 97 09/23/2020 1022   MCV 90 12/28/2012 0454   MCH 30.2 05/10/2021 0552   MCHC 31.8 05/10/2021 0552   RDW 13.9 05/10/2021 0552   RDW 15.1 09/23/2020 1022   RDW 14.8 (H) 12/28/2012 0454   LYMPHSABS 0.6 (L) 02/04/2021 0403   LYMPHSABS 1.5 09/23/2020 1022   LYMPHSABS 0.7 (L) 12/28/2012 0454   MONOABS 1.3 (H) 02/04/2021 0403   MONOABS 0.6 12/28/2012 0454   EOSABS 0.0 02/04/2021 0403   EOSABS 0.3 09/23/2020 1022   EOSABS  0.8 (H) 12/28/2012 0454   BASOSABS 0.1 02/04/2021 0403   BASOSABS 0.1 09/23/2020 1022   BASOSABS 0.0 12/28/2012 0454    CMP Latest Ref Rng & Units 05/10/2021 05/09/2021 05/08/2021  Glucose 70 - 99 mg/dL 86 114(H) 122(H)  BUN 8 - 23 mg/dL 37(H) 36(H) 27(H)  Creatinine 0.44 - 1.00 mg/dL 1.09(H) 1.09(H) 1.10(H)  Sodium 135 - 145 mmol/L 139 139 141  Potassium 3.5 - 5.1 mmol/L 4.3 4.1 5.0  Chloride 98 - 111 mmol/L 99 97(L) 102  CO2 22 - 32 mmol/L 33(H) 31 33(H)  Calcium 8.9 - 10.3 mg/dL 9.3 9.5 9.4  Total Protein 6.5 - 8.1 g/dL - - 6.6  Total Bilirubin 0.3 - 1.2 mg/dL - - 0.8  Alkaline Phos 38 - 126 U/L - - 73  AST 15 - 41 U/L - - 26  ALT 0 - 44 U/L - - 23      Microbiology: Recent Results (from the past 240 hour(s))  Resp Panel by RT-PCR (Flu A&B, Covid) Nasopharyngeal Swab     Status: None   Collection Time:  05/06/21  2:03 PM   Specimen: Nasopharyngeal Swab; Nasopharyngeal(NP) swabs in vial transport medium  Result Value Ref Range Status   SARS Coronavirus 2 by RT PCR NEGATIVE NEGATIVE Final    Comment: (NOTE) SARS-CoV-2 target nucleic acids are NOT DETECTED.  The SARS-CoV-2 RNA is generally detectable in upper respiratory specimens during the acute phase of infection. The lowest concentration of SARS-CoV-2 viral copies this assay can detect is 138 copies/mL. A negative result does not preclude SARS-Cov-2 infection and should not be used as the sole basis for treatment or other patient management decisions. A negative result may occur with  improper specimen collection/handling, submission of specimen other than nasopharyngeal swab, presence of viral mutation(s) within the areas targeted by this assay, and inadequate number of viral copies(<138 copies/mL). A negative result must be combined with clinical observations, patient history, and epidemiological information. The expected result is Negative.  Fact Sheet for Patients:  EntrepreneurPulse.com.au  Fact Sheet for Healthcare Providers:  IncredibleEmployment.be  This test is no t yet approved or cleared by the [place] FDA and  has been authorized for detection and/or diagnosis of SARS-CoV-2 by FDA under an Emergency Use Authorization (EUA). This EUA will remain  in effect (meaning this test can be used) for the duration of the COVID-19 declaration under Section 564(b)(1) of the Act, 21 U.S.C.section 360bbb-3(b)(1), unless the authorization is terminated  or revoked sooner.       Influenza A by PCR NEGATIVE NEGATIVE Final   Influenza B by PCR NEGATIVE NEGATIVE Final    Comment: (NOTE) The Xpert Xpress SARS-CoV-2/FLU/RSV plus assay is intended as an aid in the diagnosis of influenza from Nasopharyngeal swab specimens and should not be used as a sole basis for treatment. Nasal washings  and aspirates are unacceptable for Xpert Xpress SARS-CoV-2/FLU/RSV testing.  Fact Sheet for Patients: EntrepreneurPulse.com.au  Fact Sheet for Healthcare Providers: IncredibleEmployment.be  This test is not yet approved or cleared by the [place] FDA and has been authorized for detection and/or diagnosis of SARS-CoV-2 by FDA under an Emergency Use Authorization (EUA). This EUA will remain in effect (meaning this test can be used) for the duration of the COVID-19 declaration under Section 564(b)(1) of the Act, 21 U.S.C. section 360bbb-3(b)(1), unless the authorization is terminated or revoked.  Performed at [facility], [address] [place], [place] [address]   Blood culture (routine x 2)  Status: None (Preliminary result)   Collection Time: 05/06/21  2:12 PM   Specimen: BLOOD  Result Value Ref Range Status   Specimen Description BLOOD BLOOD RIGHT ARM  Final   Special Requests   Final    BOTTLES DRAWN AEROBIC AND ANAEROBIC Blood Culture adequate volume   Culture   Final    NO GROWTH 2 DAYS Performed at [facility], [address], [place], [place] [address]    Report Status PENDING  Incomplete  Blood culture (routine x 2)     Status: None (Preliminary result)   Collection Time: 05/06/21  2:12 PM   Specimen: BLOOD  Result Value Ref Range Status   Specimen Description BLOOD LEFT ANTECUBITAL  Final   Special Requests   Final    BOTTLES DRAWN AEROBIC AND ANAEROBIC Blood Culture results may not be optimal due to an inadequate volume of blood received in culture bottles   Culture   Final    NO GROWTH 2 DAYS Performed at [facility], [address] [place], [place] [address]    Report Status PENDING  Incomplete    IMAGING RESULTS:  I have personally reviewed the films ? Impression/Recommendation ?49yrfemale admitted with back pain, fatigue, Syncope Chronic foot wounds  Syncope- -  combination of dehydration/pain meds/orthostasis- R/o infection Will get cortisol level  Chronic foot wounds- following gangrene in sept 2021 when she had septic shock- autoamputation of 2nd and third toe, with exposed 2nd left metarsal head and she underwent left first toe ray amputation and resection of second metarsal head on 01/14/21  Rt great toe- wound chronic- Does not look infected -I do not see any exposed bone .With Xray findings of tuft irregularity and bone chips being scraped out when scab was removed by her husband at home will discuss with Dr.Dew .  Pulmonary sarcoid- with interstitial lung disease- on steroids, Methotrexate  Compression fractures L2-L4- for kyphoplasty today ? ? ___________________________________________________ Discussed with patient,husband and care team Note:  This document was prepared using Dragon voice recognition software and may include unintentional dictation errors.

## 2021-05-11 ENCOUNTER — Encounter: Payer: Self-pay | Admitting: Orthopedic Surgery

## 2021-05-11 DIAGNOSIS — S32000A Wedge compression fracture of unspecified lumbar vertebra, initial encounter for closed fracture: Secondary | ICD-10-CM | POA: Diagnosis present

## 2021-05-11 DIAGNOSIS — S91309A Unspecified open wound, unspecified foot, initial encounter: Secondary | ICD-10-CM | POA: Diagnosis present

## 2021-05-11 DIAGNOSIS — S91309D Unspecified open wound, unspecified foot, subsequent encounter: Secondary | ICD-10-CM | POA: Diagnosis not present

## 2021-05-11 DIAGNOSIS — R55 Syncope and collapse: Secondary | ICD-10-CM | POA: Diagnosis not present

## 2021-05-11 LAB — CBC
HCT: 35.2 % — ABNORMAL LOW (ref 36.0–46.0)
Hemoglobin: 11.5 g/dL — ABNORMAL LOW (ref 12.0–15.0)
MCH: 31.6 pg (ref 26.0–34.0)
MCHC: 32.7 g/dL (ref 30.0–36.0)
MCV: 96.7 fL (ref 80.0–100.0)
Platelets: 337 10*3/uL (ref 150–400)
RBC: 3.64 MIL/uL — ABNORMAL LOW (ref 3.87–5.11)
RDW: 14 % (ref 11.5–15.5)
WBC: 13.6 10*3/uL — ABNORMAL HIGH (ref 4.0–10.5)
nRBC: 0 % (ref 0.0–0.2)

## 2021-05-11 LAB — BASIC METABOLIC PANEL
Anion gap: 9 (ref 5–15)
BUN: 34 mg/dL — ABNORMAL HIGH (ref 8–23)
CO2: 33 mmol/L — ABNORMAL HIGH (ref 22–32)
Calcium: 9.6 mg/dL (ref 8.9–10.3)
Chloride: 101 mmol/L (ref 98–111)
Creatinine, Ser: 1.18 mg/dL — ABNORMAL HIGH (ref 0.44–1.00)
GFR, Estimated: 51 mL/min — ABNORMAL LOW (ref >60)
Glucose, Bld: 87 mg/dL (ref 70–99)
Potassium: 4.9 mmol/L (ref 3.5–5.1)
Sodium: 143 mmol/L (ref 135–145)

## 2021-05-11 LAB — CULTURE, BLOOD (ROUTINE X 2)
Culture: NO GROWTH
Culture: NO GROWTH
Special Requests: ADEQUATE

## 2021-05-11 LAB — SEDIMENTATION RATE: Sed Rate: 84 mm/hr — ABNORMAL HIGH (ref 0–30)

## 2021-05-11 LAB — CORTISOL-AM, BLOOD: Cortisol - AM: 5.5 ug/dL — ABNORMAL LOW (ref 6.7–22.6)

## 2021-05-11 LAB — C-REACTIVE PROTEIN: CRP: 5.4 mg/dL — ABNORMAL HIGH (ref <1.0)

## 2021-05-11 MED ORDER — CYCLOBENZAPRINE HCL 5 MG PO TABS: 5.0000 mg | ORAL_TABLET | Freq: Three times a day (TID) | ORAL | 0 refills | Status: DC | PRN

## 2021-05-11 MED ORDER — HYDROCODONE-ACETAMINOPHEN 5-325 MG PO TABS: 1.0000 | ORAL_TABLET | Freq: Four times a day (QID) | ORAL | 0 refills | Status: AC | PRN

## 2021-05-11 MED ORDER — NYSTATIN 100000 UNIT/ML MT SUSP
5.0000 mL | Freq: Four times a day (QID) | OROMUCOSAL | Status: DC
  Administered 2021-05-11 (×3): 500000 [IU] via OROMUCOSAL
  Filled 2021-05-11 (×3): qty 5

## 2021-05-11 MED ORDER — PREDNISONE 5 MG PO TABS: 20.0000 mg | ORAL_TABLET | Freq: Every day | ORAL | 0 refills | Status: DC

## 2021-05-11 MED ORDER — SENNOSIDES-DOCUSATE SODIUM 8.6-50 MG PO TABS: 1.0000 | ORAL_TABLET | Freq: Two times a day (BID) | ORAL | 0 refills | Status: DC

## 2021-05-11 NOTE — Progress Notes (Signed)
Date of Admission:  05/06/2021     ID: Stacie [name] is a 65 y.o. female  Principal Problem:   Syncope and collapse Active Problems:   Pulmonary sarcoidosis (HCC)   Hypothyroidism, acquired   Pulmonary hypertension (HCC)   Lumbar compression fracture (HCC)   Open wound of foot    Subjective: Doing well Says back pain better Underwent kyphoplasty yesterday  Medications:   aspirin EC  81 mg Oral Daily   B-complex with vitamin C  1 tablet Oral Daily   cyclobenzaprine  5 mg Oral TID   dorzolamide  1 drop Both Eyes BID   escitalopram  20 mg Oral Daily   fluticasone furoate-vilanterol  1 puff Inhalation Daily   folic acid  1 mg Oral Daily   latanoprost  1 drop Right Eye QHS   levothyroxine  137 mcg Oral QAC breakfast   lidocaine  2 patch Transdermal Q24H   multivitamin with minerals  1 tablet Oral Daily   nystatin  5 mL Mouth/Throat QID   predniSONE  20 mg Oral Q breakfast   senna-docusate  1 tablet Oral BID   Treprostinil  72 mcg Inhalation QID    Objective: Vital signs in last 24 hours: Temp:  [97.5 F (36.4 C)-98.7 F (37.1 C)] 98.1 F (36.7 C) (07/26 1522) Pulse Rate:  [85-96] 94 (07/26 1522) Resp:  [12-20] 15 (07/26 1522) BP: (119-158)/(73-91) 148/77 (07/26 1522) SpO2:  [94 %-98 %] 95 % (07/26 1522)  PHYSICAL EXAM:  General: Alert, cooperative, no distress, appears stated age.  Lungs: Clear to auscultation bilaterally. No Wheezing or Rhonchi. No rales. Heart: Regular rate and rhythm, no murmur, rub or gallop. Abdomen: Soft, non-tender,not distended. Bowel sounds normal. No masses Extremities: rt great toe- wound on the top of the toe- healing- there is no bone felt/or seen  Left foot amputation of 1, 2 3 toes Wound does not look infected overtly Skin: No rashes or lesions. Or bruising Lymph: Cervical, supraclavicular normal. Neurologic: Grossly non-focal  Lab Results Recent Labs    05/10/21 0552 05/11/21 0536  WBC 11.1* 13.6*  HGB 11.1* 11.5*   HCT 34.9* 35.2*  NA 139 143  K 4.3 4.9  CL 99 101  CO2 33* 33*  BUN 37* 34*  CREATININE 1.09* 1.18*   Liver Panel No results for input(s): PROT, ALBUMIN, AST, ALT, ALKPHOS, BILITOT, BILIDIR, IBILI in the last 72 hours. Sedimentation Rate Recent Labs    05/11/21 0536  ESRSEDRATE 84*   C-Reactive Protein Recent Labs    05/11/21 0536  CRP 5.4*    Microbiology:  Studies/Results: DG Lumbar Spine 2-3 Views  Result Date: 05/10/2021 CLINICAL DATA:  Surgery, elective. Additional history provided: L2, L3, L4 kyphoplasty. Provided fluoroscopy time: 2 minutes, 16 seconds. EXAM: LUMBAR SPINE - 2-3 VIEW; DG C-ARM 1-60 MIN COMPARISON:  Lumbar spine MRI 05/07/2021. FINDINGS: Twelve intraprocedural fluoroscopic images of the lumbar spine are submitted. On the provided images, there are findings of interval kyphoplasty at what are presumably the L2, L3 and L4 levels (given the provided history). IMPRESSION: Twelve intraprocedural fluoroscopic images of the lumbar spine from reported L2, L3 and L4 kyphoplasty. Electronically Signed   By: Kellie Simmering DO   On: 05/10/2021 13:53   DG C-Arm 1-60 Min  Result Date: 05/10/2021 CLINICAL DATA:  Surgery, elective. Additional history provided: L2, L3, L4 kyphoplasty. Provided fluoroscopy time: 2 minutes, 16 seconds. EXAM: LUMBAR SPINE - 2-3 VIEW; DG C-ARM 1-60 MIN COMPARISON:  Lumbar spine MRI 05/07/2021.  FINDINGS: Twelve intraprocedural fluoroscopic images of the lumbar spine are submitted. On the provided images, there are findings of interval kyphoplasty at what are presumably the L2, L3 and L4 levels (given the provided history). IMPRESSION: Twelve intraprocedural fluoroscopic images of the lumbar spine from reported L2, L3 and L4 kyphoplasty. Electronically Signed   By: Kellie Simmering DO   On: 05/10/2021 13:53     Assessment/Plan: Syncope/likely dehydration along with pain meds  Foot wounds- does not look infected currently S/p amputation of 1/2/3 left  toes many months ago No overt infection  Rt great toe- does not appear infected  Pulmonary sarcoidosis- on steroids and MTX  H/o CVA H/o septic shock and klen bacteremia in 2021 H/o gangrene left foot and rt great toe in 2021  Will DC antibiotics Follow up with vascular s OP  Discussed the management with her husband and care team

## 2021-05-11 NOTE — Discharge Summary (Addendum)
Physician Discharge Summary  Stacie [name] NTI:[id number] DOB: 24-Feb-1956 DOA: 05/06/2021  PCP: Glean Hess, MD  Admit date: 05/06/2021 Discharge date: 05/11/2021  Time spent: 71mnutes  Recommendations for Outpatient Follow-up:  Vascular surgery Dr. DLucky Cowboyin the next 1 to 2 weeks to evaluate right great toe wound for bone biopsy/cultures 2.   PCP in 1week  Discharge Diagnoses:  Active Problems: Syncope and collapse Hypotension Chronic foot wounds Leukocytosis History of left foot osteomyelitis Chronic respiratory failure on 2 to 3 L home O2 Pulmonary sarcoidosis Interstitial lung disease Chronic immunosuppression Lumbar compression fractures Acute kidney injury History of CVA Osteoporosis Nephrolithiasis  Discharge Condition: Stable  Diet recommendation: Regular  Filed Weights   05/06/21 1053 05/10/21 1133  Weight: 76.2 kg 76.2 kg    History of present illness:  Stacie [name] is a chronically ill female with history of pulmonary sarcoidosis on chronic prednisone, methotrexate, ILD, chronic respiratory failure on 2 to 3 L home O2, pulmonary hypertension, CKD stage III, history of CVA, hypothyroidism, nephrolithiasis, history of left foot gangrene status post ray and second metatarsal head amputation in 3/22, she has chronic wounds that are dressed daily by her spouse, OSA on CPAP presented to the ED for evaluation after 2 syncopal episodes, she has been having low back pain for the last 1 month, worse in the last week.  Due to have elective kyphoplasty this upcoming week per Dr.Menz for lumbar compression fractures.  Had 2 witnessed syncopal episodes on her bed 7/21 where she lost consciousness, subsequently brought to the ED. -She was noted to be hypotensive(BP 80s), creatinine was mildly elevated at 1.5 and white count was 24K  Hospital Course:   Syncope Hypotension -Syncopal event secondary to hypotension -Blood pressure stabilized after IV fluids,  antibiotics -Etiology likely dehydration, meds/vasodilators, initially there was a concern for infection -WBC was 24K on admission, blood cultures negative x2 days, leukocytosis improving, on empiric vancomycin and cefepime -Treated with higher dose prednisone for 2 days, transitioned back to 20 mg daily, blood pressure stable on home dose steroids -No events on telemetry -Ambulating in the room and with PT   Chronic foot wounds/leukocytosis H/o L foot Osteomyelitis 4/22 and small R great toe wound -Prior history of gangrene underwent left foot first toe amputation and resection of part of the first and second metatarsal head in March 22, MRI in April was concerning for osteomyelitis, she was treated with 6 weeks of IV daptomycin and ceftriaxone on 5/24, followed by Dr. DLucky Cowboyfor chronic foot wounds -X-rays on admission noted cortical irregularity in both feet -In setting of chronic wounds and leukocytosis,  ESR was 117 and CRP was 30, this was concerning, hence MRI L foot (larger wound ) was obtained 7/23, this was reassuring, substantial improvement noted compared to MRI in March, no marrow edema noted, some inflammatory changes noted in the of overlying soft tissue -Appreciate input from vascular surgeon Dr. DLucky Cowboy chronic wounds are felt to be stable without evidence of active infection -Requested ID input, seen by Dr. RDelaine Lameyesterday, plan to discontinue antibiotics today at discharge. -Dr. DLucky Cowboyto follow right great toe wound in the next 1 to 2 weeks to determine need for bone biopsy/cultures   Pulmonary sarcoidosis/interstitial lung disease Pulmonary hypertension Chronic respiratory failure Chronic immunosuppression -On 2 to 3 L home O2 at baseline -On baseline prednisone 20 mg daily which was doubled initially, back to 20 mg daily, -resume methotrexate at discharge -Continue Symbicort, Tyvaso (treprostinil), albuterol PRN -Stable from a respiratory  standpoint   Subacute low back  pain L2-L3-L4 compression fractures -L2 appears more subacute -Continue Flexeril, PRN hydrocodone, lidocaine patch -Underwent L2-L3-L4 kyphoplasty yesterday by Dr. Rudene Christians,, symptoms improving, ambulating better, worked with PT yesterday   Acute kidney injury -Secondary to dehydration, hypotension, resolved  Procedures: L2-L3-L4 kyphoplasty Dr. Rudene Christians, 7/25  Consultations: Orthopedics Dr. Rudene Christians Vascular surgery Dr. Lucky Cowboy Infectious disease Dr. Delaine Lame  Discharge Exam: Vitals:   05/11/21 1135 05/11/21 1152  BP: (!) 143/76   Pulse: 96   Resp: 12 16  Temp: 98.1 F (36.7 C)   SpO2: 95%     General: AAOx3, no distress Cardiovascular: S1S2/RRR Respiratory: Improved air movement, decreased breath sounds to bases  Discharge Instructions    Allergies as of 05/11/2021       Reactions   Nitrofurantoin Nausea Only   Plaquenil [hydroxychloroquine]    Tramadol Nausea And Vomiting   Sulfa Antibiotics Rash   As an infant        Medication List     STOP taking these medications    ciprofloxacin 500 MG tablet Commonly known as: CIPRO   potassium citrate 10 MEQ (1080 MG) SR tablet Commonly known as: UROCIT-K       TAKE these medications    acetaminophen 500 MG tablet Commonly known as: TYLENOL Take 500 mg by mouth every 6 (six) hours as needed for moderate pain or headache.   albuterol (2.5 MG/3ML) 0.083% nebulizer solution Commonly known as: PROVENTIL Inhale 2.5 mg into the lungs every 6 (six) hours as needed for wheezing or shortness of breath.   aspirin 81 MG EC tablet Take 1 tablet (81 mg total) by mouth daily. Swallow whole.   cyclobenzaprine 5 MG tablet Commonly known as: FLEXERIL Take 1 tablet (5 mg total) by mouth 3 (three) times daily as needed for muscle spasms.   denosumab 60 MG/ML Sosy injection Commonly known as: PROLIA Inject 60 mg into the skin every 6 (six) months.   dorzolamide 2 % ophthalmic solution Commonly known as: TRUSOPT Place 1  drop into both eyes 2 (two) times daily.   ergocalciferol 1.25 MG (50000 UT) capsule Commonly known as: VITAMIN D2 Take 50,000 Units by mouth once a week. On tuesday   escitalopram 20 MG tablet Commonly known as: LEXAPRO TAKE 1 TABLET BY MOUTH EVERY DAY   folic acid 1 MG tablet Commonly known as: FOLVITE Take 1 tablet (1 mg total) by mouth daily.   HYDROcodone-acetaminophen 5-325 MG tablet Commonly known as: Norco Take 1 tablet by mouth every 6 (six) hours as needed for up to 10 days for moderate pain or severe pain. What changed: reasons to take this   latanoprost 0.005 % ophthalmic solution Commonly known as: XALATAN Place 1 drop into the right eye at bedtime.   methotrexate 2.5 MG tablet Commonly known as: RHEUMATREX Take 10 tablets (25 mg total) by mouth every Tuesday.   multivitamin with minerals tablet Take 1 tablet by mouth daily.   NON FORMULARY CPAP nightly   OXYGEN Inhale 2 L into the lungs daily as needed (During walking and activity).   predniSONE 5 MG tablet Commonly known as: DELTASONE Take 4 tablets (20 mg total) by mouth daily with breakfast. What changed:  how much to take how to take this when to take this additional instructions   senna-docusate 8.6-50 MG tablet Commonly known as: Senokot-S Take 1 tablet by mouth 2 (two) times daily.   Treprostinil 0.6 MG/ML Soln Commonly known as: TYVASO Inhale 0.6 mcg into  the lungs See admin instructions. 12 breaths 4 times a day   VITAMIN B COMPLEX PO Take 1 tablet by mouth daily.       ASK your doctor about these medications    atorvastatin 40 MG tablet Commonly known as: LIPITOR Take 1 tablet (40 mg total) by mouth daily. Resume once daptomycin is finished   budesonide-formoterol 80-4.5 MCG/ACT inhaler Commonly known as: Symbicort Take 2 puffs first thing in am and then another 2 puffs about 12 hours later.   levothyroxine 137 MCG tablet Commonly known as: Synthroid TAKE 1 TABLET (137 MCG  TOTAL) BY MOUTH DAILY BEFORE BREAKFAST.       Allergies  Allergen Reactions   Nitrofurantoin Nausea Only   Plaquenil [Hydroxychloroquine]    Tramadol Nausea And Vomiting   Sulfa Antibiotics Rash    As an infant      The results of significant diagnostics from this hospitalization (including imaging, microbiology, ancillary and laboratory) are listed below for reference.    Significant Diagnostic Studies: DG Lumbar Spine 2-3 Views  Result Date: 05/10/2021 CLINICAL DATA:  Surgery, elective. Additional history provided: L2, L3, L4 kyphoplasty. Provided fluoroscopy time: 2 minutes, 16 seconds. EXAM: LUMBAR SPINE - 2-3 VIEW; DG C-ARM 1-60 MIN COMPARISON:  Lumbar spine MRI 05/07/2021. FINDINGS: Twelve intraprocedural fluoroscopic images of the lumbar spine are submitted. On the provided images, there are findings of interval kyphoplasty at what are presumably the L2, L3 and L4 levels (given the provided history). IMPRESSION: Twelve intraprocedural fluoroscopic images of the lumbar spine from reported L2, L3 and L4 kyphoplasty. Electronically Signed   By: Kellie Simmering DO   On: 05/10/2021 13:53   DG Abd 1 View  Result Date: 04/22/2021 CLINICAL DATA:  Flank pain, history of urolithiasis EXAM: ABDOMEN - 1 VIEW COMPARISON:  07/15/2020 abdominal radiograph. 07/17/2020 CT abdomen/pelvis. FINDINGS: Interval removal of bilateral nephroureteral stents. No radiopaque stones overlie the kidneys or expected locations of the ureters or bladder. Several surgical clips overlie the sacrum in the midline. No dilated small bowel loops. Mild colonic stool. No evidence of pneumatosis or pneumoperitoneum. Moderate lumbar spondylosis. IMPRESSION: No radiopaque urolithiasis. Electronically Signed   By: Ilona Sorrel M.D.   On: 04/22/2021 13:49   MR LUMBAR SPINE WO CONTRAST  Result Date: 05/07/2021 CLINICAL DATA:  Low back pain, increased fracture risk EXAM: MRI LUMBAR SPINE WITHOUT CONTRAST TECHNIQUE: Multiplanar,  multisequence MR imaging of the lumbar spine was performed. No intravenous contrast was administered. COMPARISON:  None. FINDINGS: Segmentation:  Standard Alignment:  Grade 1 retrolisthesis at L5-S1 Vertebrae: There is a chronic compression deformity of T12 approximately 25% height loss. There is a recent compression fracture of L2 with mild edema underlying the superior endplate. There is approximately 25% height loss. No retropulsion. There are age-indeterminate compression deformities of L3 and L4 with approximately 10% height loss at L3 and 25% height loss at L4. Conus medullaris and cauda equina: Conus extends to the L1 level. Conus and cauda equina appear normal. Paraspinal and other soft tissues: Negative. Disc levels: L1-L2: Normal disc space and facet joints. No spinal canal stenosis. No neural foraminal stenosis. L2-L3: Normal disc space and facet joints. No spinal canal stenosis. No neural foraminal stenosis. L3-L4: Mild disc bulge and moderate facet hypertrophy. No spinal canal stenosis. No neural foraminal stenosis. L4-L5: Small left asymmetric disc bulge. Left lateral recess narrowing without central spinal canal stenosis. No neural foraminal stenosis. L5-S1: Small central disc protrusion with mild facet hypertrophy. No spinal canal stenosis. No neural  foraminal stenosis. Visualized sacrum: Normal. IMPRESSION: 1. Acute or subacute compression fracture of L2 with approximately 25% height loss. No retropulsion. 2. Age-indeterminate compression deformities of L3 and L4 with approximately 10-25% height loss. 3. No central spinal canal or neural foraminal stenosis. 4. Chronic T12 compression deformity. Electronically Signed   By: Ulyses Jarred M.D.   On: 05/07/2021 02:47   US RENAL  Result Date: 04/26/2021 CLINICAL DATA:  Gross hematuria.  Back pain. EXAM: RENAL / URINARY TRACT ULTRASOUND COMPLETE COMPARISON:  CT abdomen and pelvis 07/17/2020. Renal ultrasound 11/20/2020. FINDINGS: Right Kidney: Renal  measurements: 9.4 x 4.9 x 4.1 cm = volume: 98 mL. Echogenicity is mildly increased. No mass or hydronephrosis visualized. Left Kidney: Renal measurements: 10.0 x 5.0 x 4.9 cm = volume: 127 mL. Echogenicity is mildly increased. 1.6 cm cyst lower pole noted. No solid mass. Mild hydronephrosis is unchanged. Bladder: Appears normal for degree of bladder distention. Other: None. IMPRESSION: No new abnormality since the prior ultrasound. No change in mild left hydronephrosis. No change in mildly increased cortical echogenicity bilaterally suggestive of medical renal disease. Electronically Signed   By: Inge Rise M.D.   On: 04/26/2021 12:11   MR FOOT LEFT W WO CONTRAST  Result Date: 05/08/2021 CLINICAL DATA:  Foot swelling, diabetic, osteomyelitis suspected. History of pulmonary sarcoidosis on chronic prednisone, methotrexate, ILD, chronic respiratory failure on 2 to 3 L home O2, pulmonary hypertension, CKD stage III, history of CVA, hypothyroidism, nephrolithiasis, history of left foot gangrene status post amputation in March 2022. Chronic wounds. EXAM: MRI OF THE LEFT FOREFOOT WITHOUT AND WITH CONTRAST TECHNIQUE: Multiplanar, multisequence MR imaging of the left forefoot was performed both before and after administration of intravenous contrast. CONTRAST:  7.31m GADAVIST GADOBUTROL 1 MMOL/ML IV SOLN COMPARISON:  Multiple exams, including 05/06/2021 and 02/03/2021 FINDINGS: Despite efforts by the technologist and patient, motion artifact is present on today's exam and could not be eliminated. This reduces exam sensitivity and specificity. Bones/Joint/Cartilage Prior amputation of the first digit at the level of the metatarsal head and second digit at the level of the distal metatarsal metaphysis. Prior amputation of the third digit at the level of the MTP joint. Substantial improvement in the marrow edema and enhancement along the distal margin of the first metatarsal, with only a thin rim of peripheral  accentuated T2 signal along the distal margin of the first metatarsal as shown on image 11 series 9. Similar improved and nearly resolved edema in the head of the third metatarsal and along the distal margin of the second metatarsal the amputation site. Subtle residual endosteal edema primarily along the distal margins of the first metatarsal margin and head of the third metatarsal, but not a characteristic appearance for active osteomyelitis. Ligaments Lisfranc ligament intact Muscles and Tendons Mild flexor hallucis longus tenosynovitis just proximal to the knot of Henry. Low-level edema along the flexor digitorum tendons distally. Soft tissues Substantial improvement in the edema and enhancement along the amputation flap. There still some moderate residual accentuated enhancement suggesting inflammatory findings in the flap overlying the first and second metatarsal heads, with associated low T1 signal and overlying bandaging. However, I not see a drainable fluid collection nor do I see obvious substantial hypoenhancing regions at in this area to indicate abscess or necrotic soft tissues. IMPRESSION: 1. Substantial improvement compared to 02/03/2021, with near complete resolution of the marrow edema previously along the distal margin of the first metatarsal and in the third metatarsal head, and only with some subtle residual  endosteal edema in these regions. No active osteomyelitis currently identified. 2. Inflammatory findings along the flap overlying the first and second and to a lesser extent third metatarsal margins, with abnormal edema signal and enhancement but no hypoenhancement characteristic of necrotic tissue, and no appreciable drainable abscess. 3. Mild flexor hallucis longus tenosynovitis and edema tracking along the flexor digitorum tendons. Electronically Signed   By: Van Clines M.D.   On: 05/08/2021 17:08   US Carotid Bilateral  Result Date: 05/06/2021 CLINICAL DATA:  Syncope, collapse,  history of TIA EXAM: BILATERAL CAROTID DUPLEX ULTRASOUND TECHNIQUE: Pearline Cables scale imaging, color Doppler and duplex ultrasound were performed of bilateral carotid and vertebral arteries in the neck. COMPARISON:  07/18/2020 FINDINGS: Criteria: Quantification of carotid stenosis is based on velocity parameters that correlate the residual internal carotid diameter with NASCET-based stenosis levels, using the diameter of the distal internal carotid lumen as the denominator for stenosis measurement. The following velocity measurements were obtained: RIGHT ICA: 110/24 cm/sec CCA: 09/81 cm/sec SYSTOLIC ICA/CCA RATIO:  1.4 ECA: 139 cm/sec LEFT ICA: 83/14 cm/sec CCA: 191/47 cm/sec SYSTOLIC ICA/CCA RATIO:  0.8 ECA: 105 cm/sec RIGHT CAROTID ARTERY: Mild calcified atheromatous plaque at the carotid bifurcation. No significant stenosis by grayscale imaging. RIGHT VERTEBRAL ARTERY:  Antegrade. LEFT CAROTID ARTERY: Minimal atheromatous plaque within the proximal left internal carotid artery. No significant stenosis by grayscale imaging. LEFT VERTEBRAL ARTERY:  Antegrade. IMPRESSION: 1. No significant flow limiting stenosis within either carotid artery. Estimated 0-49% stenosis bilaterally. Electronically Signed   By: Randa Ngo M.D.   On: 05/06/2021 17:30   DG Chest Portable 1 View  Result Date: 05/06/2021 CLINICAL DATA:  Syncope. EXAM: PORTABLE CHEST 1 VIEW COMPARISON:  February 02, 2021. FINDINGS: Stable cardiomediastinal silhouette. Stable bilateral lung opacities are noted most consistent with sarcoidosis. Stable left basilar scarring is noted also related to sarcoidosis. No pneumothorax is noted. Status post vertebral plasty at multiple levels in the thoracic spine. IMPRESSION: Stable chronic findings as described above. No definite acute abnormality seen. Electronically Signed   By: Marijo Conception M.D.   On: 05/06/2021 12:20   DG Foot Complete Left  Result Date: 05/06/2021 CLINICAL DATA:  Wound eval for osteomyelitis  EXAM: LEFT FOOT - COMPLETE 3+ VIEW COMPARISON:  CT 02/01/2021 FINDINGS: Prior great toe amputation to the level the first metatarsal with persistent and cortical irregularity and fragmentation. Prior second toe amputation to the distal metatarsal, with continued small oblique fracture of the metatarsal neck and adjacent bony fragmentation. Prior third toe amputation with residual third metatarsal. There is no new osseous destruction of the third metatarsal head. Similar soft tissue within swelling along the amputation sites. IMPRESSION: Persistent irregularity and bony fragmentation distal aspect of the residual first and second metatarsals, with persistent soft tissue wound and swelling along the amputation sites. Findings could be related to postoperative change or osteomyelitis. No new bony destruction of the third metatarsal head. Electronically Signed   By: Maurine Simmering   On: 05/06/2021 14:16   DG Toe Great Right  Result Date: 05/06/2021 CLINICAL DATA:  Wound, eval for osteomyelitis EXAM: RIGHT GREAT TOE COMPARISON:  None. FINDINGS: There is cortical regularity of the great toe distal tuft, with overlying soft tissue defect and swelling. IMPRESSION: Findings concerning for osteomyelitis of the great toe distal tuft. Electronically Signed   By: Maurine Simmering   On: 05/06/2021 14:17   DG C-Arm 1-60 Min  Result Date: 05/10/2021 CLINICAL DATA:  Surgery, elective. Additional history provided: L2, L3, L4  kyphoplasty. Provided fluoroscopy time: 2 minutes, 16 seconds. EXAM: LUMBAR SPINE - 2-3 VIEW; DG C-ARM 1-60 MIN COMPARISON:  Lumbar spine MRI 05/07/2021. FINDINGS: Twelve intraprocedural fluoroscopic images of the lumbar spine are submitted. On the provided images, there are findings of interval kyphoplasty at what are presumably the L2, L3 and L4 levels (given the provided history). IMPRESSION: Twelve intraprocedural fluoroscopic images of the lumbar spine from reported L2, L3 and L4 kyphoplasty. Electronically  Signed   By: Kellie Simmering DO   On: 05/10/2021 13:53   CT RENAL STONE STUDY  Result Date: 04/27/2021 CLINICAL DATA:  Flank pain, kidney stone suspected in a 65 year old female found to have persistent potential dilation of intrarenal collecting systems on the LEFT on recent sonogram. EXAM: CT ABDOMEN AND PELVIS WITHOUT CONTRAST TECHNIQUE: Multidetector CT imaging of the abdomen and pelvis was performed following the standard protocol without IV contrast. COMPARISON:  July 17, 2020. FINDINGS: Lower chest: Basilar scarring and atelectasis without effusion. Calcified coronary artery disease. Hepatobiliary: Liver with smooth contours. No visible lesion on noncontrast imaging. No pericholecystic stranding or gross biliary duct distension. Pancreas: Normal contour without signs of inflammation. Spleen: Normal size spleen.  No visible lesion. Adrenals/Urinary Tract: Adrenal glands are normal. No ureteral calculi, hydronephrosis or perinephric stranding. Tiny calculus in the interpolar RIGHT kidney. Removal of renal stents since the previous examination Stomach/Bowel: Small hiatal hernia. No perigastric stranding. Moderately large duodenal diverticuli similar to previous imaging. No surrounding stranding. No small bowel dilation. The appendix is normal. Scattered colonic diverticulosis without signs of inflammation. Postoperative changes about the sigmoid colon as before. Vascular/Lymphatic: Aortic atherosclerosis without aneurysm. Smooth contour of the IVC. There is no gastrohepatic or hepatoduodenal ligament lymphadenopathy. No retroperitoneal or mesenteric lymphadenopathy. No pelvic sidewall lymphadenopathy. Reproductive: Post hysterectomy without adnexal mass. Other: No ascites.  No free air. Musculoskeletal: Osteopenia. Interval compression of L2, L3 and L4 vertebral levels since previous imaging from October with approximately 10-20% loss of height in the central aspect of the L1 superior endplate. 20-30% loss of  height at the L2 superior endplate and approximately 30-40% loss of height at the L3 superior endplate. Minimal retropulsion without significant canal narrowing at the L2 and L3 levels. No change in alignment. Similar appearance of lower thoracic compression fractures. Degenerative changes in the spine as before. IMPRESSION: 1. Removal of renal stents since the previous examination. No hydronephrosis. 2. Tiny nonobstructing calculus in the interpolar RIGHT kidney. 3. New superior endplate compression fractures at L2, L3 and L4 as described. Correlate with any tenderness in this area. These are age indeterminate and have occurred since October of 2021 4. Incidental note is made of chronic interstitial changes at the lung bases, diverticulosis and coronary artery disease. 5.  atherosclerosis. Aortic Atherosclerosis (ICD10-I70.0). Electronically Signed   By: Zetta Bills M.D.   On: 04/27/2021 10:30    Microbiology: Recent Results (from the past 240 hour(s))  Resp Panel by RT-PCR (Flu A&B, Covid) Nasopharyngeal Swab     Status: None   Collection Time: 05/06/21  2:03 PM   Specimen: Nasopharyngeal Swab; Nasopharyngeal(NP) swabs in vial transport medium  Result Value Ref Range Status   SARS Coronavirus 2 by RT PCR NEGATIVE NEGATIVE Final    Comment: (NOTE) SARS-CoV-2 target nucleic acids are NOT DETECTED.  The SARS-CoV-2 RNA is generally detectable in upper respiratory specimens during the acute phase of infection. The lowest concentration of SARS-CoV-2 viral copies this assay can detect is 138 copies/mL. A negative result does not preclude SARS-Cov-2 infection  and should not be used as the sole basis for treatment or other patient management decisions. A negative result may occur with  improper specimen collection/handling, submission of specimen other than nasopharyngeal swab, presence of viral mutation(s) within the areas targeted by this assay, and inadequate number of viral copies(<138  copies/mL). A negative result must be combined with clinical observations, patient history, and epidemiological information. The expected result is Negative.  Fact Sheet for Patients:  EntrepreneurPulse.com.au  Fact Sheet for Healthcare Providers:  IncredibleEmployment.be  This test is no t yet approved or cleared by the [place] FDA and  has been authorized for detection and/or diagnosis of SARS-CoV-2 by FDA under an Emergency Use Authorization (EUA). This EUA will remain  in effect (meaning this test can be used) for the duration of the COVID-19 declaration under Section 564(b)(1) of the Act, 21 U.S.C.section 360bbb-3(b)(1), unless the authorization is terminated  or revoked sooner.       Influenza A by PCR NEGATIVE NEGATIVE Final   Influenza B by PCR NEGATIVE NEGATIVE Final    Comment: (NOTE) The Xpert Xpress SARS-CoV-2/FLU/RSV plus assay is intended as an aid in the diagnosis of influenza from Nasopharyngeal swab specimens and should not be used as a sole basis for treatment. Nasal washings and aspirates are unacceptable for Xpert Xpress SARS-CoV-2/FLU/RSV testing.  Fact Sheet for Patients: EntrepreneurPulse.com.au  Fact Sheet for Healthcare Providers: IncredibleEmployment.be  This test is not yet approved or cleared by the [place] FDA and has been authorized for detection and/or diagnosis of SARS-CoV-2 by FDA under an Emergency Use Authorization (EUA). This EUA will remain in effect (meaning this test can be used) for the duration of the COVID-19 declaration under Section 564(b)(1) of the Act, 21 U.S.C. section 360bbb-3(b)(1), unless the authorization is terminated or revoked.  Performed at [facility], [address] [place], [place] [address]   Blood culture (routine x 2)     Status: None   Collection Time: 05/06/21  2:12 PM   Specimen: BLOOD  Result Value Ref Range  Status   Specimen Description BLOOD BLOOD RIGHT ARM  Final   Special Requests   Final    BOTTLES DRAWN AEROBIC AND ANAEROBIC Blood Culture adequate volume   Culture   Final    NO GROWTH 5 DAYS Performed at [facility], [address] [place], [place] [address]    Report Status 05/11/2021 FINAL  Final  Blood culture (routine x 2)     Status: None   Collection Time: 05/06/21  2:12 PM   Specimen: BLOOD  Result Value Ref Range Status   Specimen Description BLOOD LEFT ANTECUBITAL  Final   Special Requests   Final    BOTTLES DRAWN AEROBIC AND ANAEROBIC Blood Culture results may not be optimal due to an inadequate volume of blood received in culture bottles   Culture   Final    NO GROWTH 5 DAYS Performed at [facility], [address] [place], [place] [address]    Report Status 05/11/2021 FINAL  Final     Labs: Basic Metabolic Panel: Recent Labs  Lab 05/07/21 0452 05/08/21 0430 05/09/21 0535 05/10/21 0552 05/11/21 0536  NA 138 141 139 139 143  K 3.8 5.0 4.1 4.3 4.9  CL 100 102 97* 99 101  CO2 29 33* 31 33* 33*  GLUCOSE 112* 122* 114* 86 87  BUN 27* 27* 36* 37* 34*  CREATININE 0.98 1.10* 1.09* 1.09* 1.18*  CALCIUM 9.0 9.4 9.5 9.3 9.6  Liver Function Tests: Recent Labs  Lab 05/06/21 1119 05/07/21 0452 05/08/21 0430  AST _0 ALT _1 ALKPHOS 67 60 73  BILITOT 1.1 0.9 0.8  PROT 6.9 6.6 6.6  ALBUMIN 3.1* 3.0* 2.7*   Recent Labs  Lab 05/06/21 1119  LIPASE 21   No results for input(s): AMMONIA in the last 168 hours. CBC: Recent Labs  Lab 05/07/21 0452 05/08/21 0430 05/09/21 0535 05/10/21 0552 05/11/21 0536  WBC 16.3* 14.3* 12.5* 11.1* 13.6*  HGB 10.8* 11.3* 10.8* 11.1* 11.5*  HCT 33.6* 34.7* 33.0* 34.9* 35.2*  MCV 96.3 96.1 95.4 95.1 96.7  PLT 275 296 321 300 337   Cardiac Enzymes: No results for input(s): CKTOTAL, CKMB, CKMBINDEX, TROPONINI in the last 168 hours. BNP: BNP (last 3 results) No results for input(s): BNP  in the last 8760 hours.  ProBNP (last 3 results) No results for input(s): PROBNP in the last 8760 hours.  CBG: No results for input(s): GLUCAP in the last 168 hours.     Signed:  Domenic Polite MD.  [facility] 05/11/2021, 2:25 PM

## 2021-05-11 NOTE — Progress Notes (Signed)
Physical Therapy Treatment Patient Details Name: Stacie [name] MRN: [id number] DOB: 01-Nov-1955 Today's Date: 05/11/2021    History of Present Illness Pt is a 65 y/o F admitted on 05/06/21 after presenting with c/o 2 syncopal episodes. Pt was found to be hypotensive. Of note, pt has planned kyphoplasty next week. 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  Pt did not needed direct assist with this, she did initially have some hesitancy and need increased cuing to organize UEs/set up in getting to standing but again able to rise w/o direct assist.  Pt managed >100 ft in the hallway with consistent and safe cadence, though even on 3L O2 she did have drop in O2 to 87% after prolonged walk and stair negotiation.  Pt feeling confident about being able to manage at home with the assist she has available.  Will benefit from HHPT to further increase mobility and independence.     Follow Up Recommendations  Home health PT;Supervision/Assistance - 24 hour     Equipment Recommendations  None recommended by PT    Recommendations for Other Services       Precautions / Restrictions Precautions Precautions: Fall Precaution Comments: kypho 7/25 Restrictions Weight Bearing Restrictions: No    Mobility  Bed Mobility Overal bed mobility: Needs Assistance Bed Mobility: Sidelying to Sit;Rolling Rolling: Supervision (able to maintain neutral spine) Sidelying to sit: Supervision (able to maintain neutral spine with UE use on rails)       General bed mobility comments: use of bed rails, no physical assist needed    Transfers Overall transfer level: Needs  assistance Equipment used: Rolling walker (2 wheeled) Transfers: Sit to/from Stand Sit to Stand: Supervision         General transfer comment: Pt needed reminders/cues for set up/hand placement but was able to rise from standing from multiple surfaces t/o the session w/o direct assist  Ambulation/Gait Ambulation/Gait assistance: Supervision Gait Distance (Feet): 125 Feet Assistive device: Rolling walker (2 wheeled)       General Gait Details: Pt with slow but consistent and safe ambulation.  She did require 3L O2 with some DOE and sats in the high 80s/low 90s and some subjective fatigue.   Stairs Stairs: Yes Stairs assistance: Supervision Stair Management: One rail Right Number of Stairs: 6 General stair comments: Pt able to manage up/down 6 steps with single rail, sideways strategy   Wheelchair Mobility    Modified Rankin (Stroke Patients Only)       Balance Overall balance assessment: Modified Independent   Sitting balance-Leahy Scale: Good     Standing balance support: Bilateral upper extremity supported Standing balance-Leahy Scale: Good                              Cognition Arousal/Alertness: Awake/alert Behavior During Therapy: WFL for tasks assessed/performed Overall Cognitive Status: Within Functional Limits for tasks assessed                                        Exercises  General Comments General comments (skin integrity, edema, etc.): Pt was able to do a prolonged bout of ambulation with good relative confidence and safety.  She did have some fatigue but showed ability to manage in the home safely and effectively.      Pertinent Vitals/Pain Pain Assessment: 0-10 Pain Score: 7  Pain Location: kypho site    Home Living                      Prior Function            PT Goals (current goals can now be found in the care plan section) Progress towards PT goals: Progressing toward goals     Frequency    Min 2X/week      PT Plan Current plan remains appropriate    Co-evaluation              AM-PAC PT "6 Clicks" Mobility   Outcome Measure  Help needed turning from your back to your side while in a flat bed without using bedrails?: None Help needed moving from lying on your back to sitting on the side of a flat bed without using bedrails?: None Help needed moving to and from a bed to a chair (including a wheelchair)?: None Help needed standing up from a chair using your arms (e.g., wheelchair or bedside chair)?: None Help needed to walk in hospital room?: A Little Help needed climbing 3-5 steps with a railing? : A Little 6 Click Score: 22    End of Session Equipment Utilized During Treatment: Oxygen Activity Tolerance: Patient tolerated treatment well Patient left: with call bell/phone within reach;in bed;with bed alarm set Nurse Communication: Mobility status PT Visit Diagnosis: Difficulty in walking, not elsewhere classified (R26.2);Muscle weakness (generalized) (M62.81)     Time: 2330-0762 PT Time Calculation (min) (ACUTE ONLY): 34 min  Charges:  $Gait Training: 8-22 mins $Therapeutic Activity: 8-22 mins                     Kreg Shropshire, DPT 05/11/2021, 4:56 PM

## 2021-05-11 NOTE — Progress Notes (Signed)
Pt being discharged home, discharge instructions reviewed with pt, states understanding, pt with no complaints, awaiting husband for transport

## 2021-05-12 ENCOUNTER — Telehealth: Payer: Self-pay

## 2021-05-12 LAB — SURGICAL PATHOLOGY

## 2021-05-12 NOTE — Telephone Encounter (Signed)
Erroneous encounter. No TCM call due to pt has [employer] and Medicare A only.

## 2021-05-14 DIAGNOSIS — I6340 Cerebral infarction due to embolism of unspecified cerebral artery: Secondary | ICD-10-CM | POA: Diagnosis not present

## 2021-05-21 ENCOUNTER — Ambulatory Visit (INDEPENDENT_AMBULATORY_CARE_PROVIDER_SITE_OTHER): Payer: Medicare Other | Admitting: Vascular Surgery

## 2021-05-24 DIAGNOSIS — R0602 Shortness of breath: Secondary | ICD-10-CM | POA: Diagnosis not present

## 2021-05-24 DIAGNOSIS — D869 Sarcoidosis, unspecified: Secondary | ICD-10-CM | POA: Diagnosis not present

## 2021-06-02 ENCOUNTER — Other Ambulatory Visit: Payer: Self-pay | Admitting: Internal Medicine

## 2021-06-02 DIAGNOSIS — K1120 Sialoadenitis, unspecified: Secondary | ICD-10-CM

## 2021-06-02 MED ORDER — CEPHALEXIN 500 MG PO CAPS: 500.0000 mg | ORAL_CAPSULE | Freq: Four times a day (QID) | ORAL | 0 refills | Status: AC

## 2021-06-14 ENCOUNTER — Ambulatory Visit: Payer: BC Managed Care – PPO | Admitting: Physician Assistant

## 2021-06-18 ENCOUNTER — Encounter: Payer: Self-pay | Admitting: Physician Assistant

## 2021-06-23 ENCOUNTER — Other Ambulatory Visit: Payer: Self-pay | Admitting: Orthopedic Surgery

## 2021-06-23 DIAGNOSIS — S22080A Wedge compression fracture of T11-T12 vertebra, initial encounter for closed fracture: Secondary | ICD-10-CM

## 2021-06-23 DIAGNOSIS — M810 Age-related osteoporosis without current pathological fracture: Secondary | ICD-10-CM | POA: Diagnosis not present

## 2021-06-23 DIAGNOSIS — S32020G Wedge compression fracture of second lumbar vertebra, subsequent encounter for fracture with delayed healing: Secondary | ICD-10-CM | POA: Diagnosis not present

## 2021-06-24 ENCOUNTER — Ambulatory Visit
Admission: RE | Admit: 2021-06-24 | Discharge: 2021-06-24 | Disposition: A | Discharge status: Home / Self Care | Payer: BC Managed Care – PPO | Source: Ambulatory Visit | Attending: Orthopedic Surgery | Admitting: Orthopedic Surgery

## 2021-06-24 ENCOUNTER — Other Ambulatory Visit: Payer: Self-pay | Admitting: Orthopedic Surgery

## 2021-06-24 DIAGNOSIS — M5134 Other intervertebral disc degeneration, thoracic region: Secondary | ICD-10-CM | POA: Diagnosis not present

## 2021-06-24 DIAGNOSIS — S22080A Wedge compression fracture of T11-T12 vertebra, initial encounter for closed fracture: Secondary | ICD-10-CM | POA: Diagnosis not present

## 2021-06-24 DIAGNOSIS — S22070A Wedge compression fracture of T9-T10 vertebra, initial encounter for closed fracture: Secondary | ICD-10-CM | POA: Diagnosis not present

## 2021-06-24 DIAGNOSIS — M5124 Other intervertebral disc displacement, thoracic region: Secondary | ICD-10-CM | POA: Diagnosis not present

## 2021-06-24 DIAGNOSIS — D869 Sarcoidosis, unspecified: Secondary | ICD-10-CM | POA: Diagnosis not present

## 2021-06-24 DIAGNOSIS — R0602 Shortness of breath: Secondary | ICD-10-CM | POA: Diagnosis not present

## 2021-06-24 NOTE — Progress Notes (Addendum)
Pt called and asked for patient instructions for surgery. Pt.said, it was a last minute decision or add -on.  Pt did not have time to go to pre-admission  testing for some reason. Pre-admission testing was not scheduled. There was no order signed and held as well. Therefore, I just instructed patient to take several medication which are prednisone, potassium, robaxin, and  lexapro tomorrow am.  No solid food after midnight the night before surgeryand clear liquid after midnight up to 2 hours before scheduled arrival time and general pre- surgery instructions given over the phone.

## 2021-06-24 NOTE — Patient Instructions (Addendum)
Your procedure is scheduled on: Report to the Registration Desk on the 1st floor of the [facility]. To find out your arrival time, please call [phone] between 1PM - 3PM on:  REMEMBER: Instructions that are not followed completely may result in serious medical risk, up to and including death; or upon the discretion of your surgeon and anesthesiologist your surgery may need to be rescheduled.  Do not eat food after midnight the night before surgery.  No gum chewing, lozengers or hard candies.  You may however, drink water up to 2 hours before you are scheduled to arrive for your surgery. Do not drink anything within 2 hours of your scheduled arrival time.  Clear liquids include: - water      TAKE THESE MEDICATIONS THE MORNING OF SURGERY WITH A SIP OF WATER: Prednisone Potassium Robaxin Lexapro      Follow recommendations from Cardiologist, Pulmonologist or PCP regarding stopping Aspirin, Coumadin, Plavix, Eliquis, Pradaxa, or Pletal.  One week prior to surgery: Stop Anti-inflammatories (NSAIDS) such as Advil, Aleve, Ibuprofen, Motrin, Naproxen, Naprosyn and Aspirin based products such as Excedrin, Goodys Powder, BC Powder. Stop ANY OVER THE COUNTER supplements until after surgery. You may however, continue to take Tylenol if needed for pain up until the day of surgery.  No Alcohol for 24 hours before or after surgery.  No Smoking including e-cigarettes for 24 hours prior to surgery.  No chewable tobacco products for at least 6 hours prior to surgery.  No nicotine patches on the day of surgery.  Do not use any "recreational" drugs for at least a week prior to your surgery.  Please be advised that the combination of cocaine and anesthesia may have negative outcomes, up to and including death. If you test positive for cocaine, your surgery will be cancelled.  On the morning of surgery brush your teeth with toothpaste and water, you may rinse your mouth with mouthwash  if you wish. Do not swallow any toothpaste or mouthwash.  Do not wear jewelry, make-up, hairpins, clips or nail polish.  Do not wear lotions, powders, or perfumes.   Do not shave body from the neck down 48 hours prior to surgery just in case you cut yourself which could leave a site for infection.  Also, freshly shaved skin may become irritated if using the CHG soap.  Contact lenses, hearing aids and dentures may not be worn into surgery.  Do not bring valuables to the hospital. [facility] is not responsible for any missing/lost belongings or valuables.   Use CHG Soap or wipes as directed on instruction sheet.  Notify your doctor if there is any change in your medical condition (cold, fever, infection).  Wear comfortable clothing (specific to your surgery type) to the hospital.  After surgery, you can help prevent lung complications by doing breathing exercises.  Take deep breaths and cough every 1-2 hours. Your doctor may order a device called an Incentive Spirometer to help you take deep breaths. If you are being admitted to the hospital overnight, leave your suitcase in the car. After surgery it may be brought to your room.  If you are being discharged the day of surgery, you will not be allowed to drive home. You will need a responsible adult (18 years or older) to drive you home and stay with you that night.   If you are taking public transportation, you will need to have a responsible adult (18 years or older) with you. Please confirm with your physician  that it is acceptable to use public transportation.   Please call the [facility] at [phone] if you have any questions about these instructions.  Surgery Visitation Policy:  Patients undergoing a surgery or procedure may have one family member or support person with them as long as that person is not COVID-19 positive or experiencing its symptoms.  That person may remain in the waiting area during  the procedure.  Inpatient Visitation:    Visiting hours are 7 a.m. to 8 p.m. Inpatients will be allowed two visitors daily. The visitors may change each day during the patient's stay. No visitors under the age of 37. Any visitor under the age of 62 must be accompanied by an adult. The visitor must pass COVID-19 screenings, use hand sanitizer when entering and exiting the patient's room and wear a mask at all times, including in the patient's room. Patients must also wear a mask when staff or their visitor are in the room. Masking is required regardless of vaccination status.

## 2021-06-25 ENCOUNTER — Encounter: Payer: Self-pay | Admitting: Orthopedic Surgery

## 2021-06-25 ENCOUNTER — Ambulatory Visit
Admission: RE | Admit: 2021-06-25 | Discharge: 2021-06-25 | Disposition: A | Discharge status: Home / Self Care | Payer: BC Managed Care – PPO | Source: Ambulatory Visit | Attending: Orthopedic Surgery | Admitting: Orthopedic Surgery

## 2021-06-25 ENCOUNTER — Ambulatory Visit: Payer: BC Managed Care – PPO

## 2021-06-25 ENCOUNTER — Other Ambulatory Visit: Payer: Self-pay

## 2021-06-25 ENCOUNTER — Ambulatory Visit: Payer: BC Managed Care – PPO | Admitting: Registered Nurse

## 2021-06-25 ENCOUNTER — Encounter
Admission: RE | Disposition: A | Discharge status: Home / Self Care | Payer: Self-pay | Source: Ambulatory Visit | Attending: Orthopedic Surgery

## 2021-06-25 DIAGNOSIS — S32020G Wedge compression fracture of second lumbar vertebra, subsequent encounter for fracture with delayed healing: Secondary | ICD-10-CM | POA: Insufficient documentation

## 2021-06-25 DIAGNOSIS — G4733 Obstructive sleep apnea (adult) (pediatric): Secondary | ICD-10-CM | POA: Diagnosis not present

## 2021-06-25 DIAGNOSIS — Z7982 Long term (current) use of aspirin: Secondary | ICD-10-CM | POA: Insufficient documentation

## 2021-06-25 DIAGNOSIS — Z4889 Encounter for other specified surgical aftercare: Secondary | ICD-10-CM | POA: Diagnosis not present

## 2021-06-25 DIAGNOSIS — E039 Hypothyroidism, unspecified: Secondary | ICD-10-CM | POA: Diagnosis not present

## 2021-06-25 DIAGNOSIS — Z882 Allergy status to sulfonamides status: Secondary | ICD-10-CM | POA: Diagnosis not present

## 2021-06-25 DIAGNOSIS — Z89429 Acquired absence of other toe(s), unspecified side: Secondary | ICD-10-CM | POA: Insufficient documentation

## 2021-06-25 DIAGNOSIS — Z7951 Long term (current) use of inhaled steroids: Secondary | ICD-10-CM | POA: Insufficient documentation

## 2021-06-25 DIAGNOSIS — S22080A Wedge compression fracture of T11-T12 vertebra, initial encounter for closed fracture: Secondary | ICD-10-CM | POA: Diagnosis not present

## 2021-06-25 DIAGNOSIS — Z79890 Hormone replacement therapy (postmenopausal): Secondary | ICD-10-CM | POA: Diagnosis not present

## 2021-06-25 DIAGNOSIS — X58XXXA Exposure to other specified factors, initial encounter: Secondary | ICD-10-CM | POA: Diagnosis not present

## 2021-06-25 DIAGNOSIS — Z6826 Body mass index (BMI) 26.0-26.9, adult: Secondary | ICD-10-CM | POA: Insufficient documentation

## 2021-06-25 DIAGNOSIS — S22070A Wedge compression fracture of T9-T10 vertebra, initial encounter for closed fracture: Secondary | ICD-10-CM | POA: Diagnosis not present

## 2021-06-25 DIAGNOSIS — Z8673 Personal history of transient ischemic attack (TIA), and cerebral infarction without residual deficits: Secondary | ICD-10-CM | POA: Diagnosis not present

## 2021-06-25 DIAGNOSIS — S32010A Wedge compression fracture of first lumbar vertebra, initial encounter for closed fracture: Secondary | ICD-10-CM | POA: Insufficient documentation

## 2021-06-25 DIAGNOSIS — Z79899 Other long term (current) drug therapy: Secondary | ICD-10-CM | POA: Diagnosis not present

## 2021-06-25 DIAGNOSIS — Z881 Allergy status to other antibiotic agents status: Secondary | ICD-10-CM | POA: Insufficient documentation

## 2021-06-25 DIAGNOSIS — N184 Chronic kidney disease, stage 4 (severe): Secondary | ICD-10-CM | POA: Insufficient documentation

## 2021-06-25 DIAGNOSIS — E782 Mixed hyperlipidemia: Secondary | ICD-10-CM | POA: Insufficient documentation

## 2021-06-25 DIAGNOSIS — Z419 Encounter for procedure for purposes other than remedying health state, unspecified: Secondary | ICD-10-CM

## 2021-06-25 DIAGNOSIS — Z885 Allergy status to narcotic agent status: Secondary | ICD-10-CM | POA: Diagnosis not present

## 2021-06-25 DIAGNOSIS — Z90710 Acquired absence of both cervix and uterus: Secondary | ICD-10-CM | POA: Insufficient documentation

## 2021-06-25 DIAGNOSIS — Z9981 Dependence on supplemental oxygen: Secondary | ICD-10-CM | POA: Insufficient documentation

## 2021-06-25 DIAGNOSIS — Z888 Allergy status to other drugs, medicaments and biological substances status: Secondary | ICD-10-CM | POA: Diagnosis not present

## 2021-06-25 HISTORY — PX: KYPHOPLASTY: SHX5884

## 2021-06-25 LAB — POTASSIUM: Potassium: 3.6 mmol/L (ref 3.5–5.1)

## 2021-06-25 SURGERY — KYPHOPLASTY
Anesthesia: General

## 2021-06-25 MED ORDER — CHLORHEXIDINE GLUCONATE 0.12 % MT SOLN
OROMUCOSAL | Status: AC
  Administered 2021-06-25: 15 mL via OROMUCOSAL
  Filled 2021-06-25: qty 15

## 2021-06-25 MED ORDER — BUPIVACAINE-EPINEPHRINE (PF) 0.5% -1:200000 IJ SOLN
INTRAMUSCULAR | Status: DC | PRN
  Administered 2021-06-25: 30 mL

## 2021-06-25 MED ORDER — HYDROCODONE-ACETAMINOPHEN 5-325 MG PO TABS: 1.0000 | ORAL_TABLET | Freq: Four times a day (QID) | ORAL | 0 refills | Status: DC | PRN

## 2021-06-25 MED ORDER — PROPOFOL 500 MG/50ML IV EMUL
INTRAVENOUS | Status: DC | PRN
  Administered 2021-06-25: 50 ug/kg/min via INTRAVENOUS

## 2021-06-25 MED ORDER — FENTANYL CITRATE (PF) 100 MCG/2ML IJ SOLN
INTRAMUSCULAR | Status: DC | PRN
  Administered 2021-06-25: 50 ug via INTRAVENOUS
  Administered 2021-06-25 (×2): 25 ug via INTRAVENOUS

## 2021-06-25 MED ORDER — DEXMEDETOMIDINE (PRECEDEX) IN NS 20 MCG/5ML (4 MCG/ML) IV SYRINGE
PREFILLED_SYRINGE | INTRAVENOUS | Status: DC | PRN
  Administered 2021-06-25: 8 ug via INTRAVENOUS

## 2021-06-25 MED ORDER — KETAMINE HCL 50 MG/5ML IJ SOSY
PREFILLED_SYRINGE | INTRAMUSCULAR | Status: AC
  Filled 2021-06-25: qty 5

## 2021-06-25 MED ORDER — MIDAZOLAM HCL 2 MG/2ML IJ SOLN
INTRAMUSCULAR | Status: AC
  Filled 2021-06-25: qty 2

## 2021-06-25 MED ORDER — PHENYLEPHRINE HCL (PRESSORS) 10 MG/ML IV SOLN
INTRAVENOUS | Status: DC | PRN
  Administered 2021-06-25 (×3): 100 ug via INTRAVENOUS

## 2021-06-25 MED ORDER — LIDOCAINE HCL 1 % IJ SOLN
INTRAMUSCULAR | Status: DC | PRN
  Administered 2021-06-25: 10 mL

## 2021-06-25 MED ORDER — CHLORHEXIDINE GLUCONATE 0.12 % MT SOLN: 15.0000 mL | Freq: Once | OROMUCOSAL | Status: AC

## 2021-06-25 MED ORDER — BUPIVACAINE-EPINEPHRINE (PF) 0.5% -1:200000 IJ SOLN
INTRAMUSCULAR | Status: AC
  Filled 2021-06-25: qty 30

## 2021-06-25 MED ORDER — FENTANYL CITRATE (PF) 100 MCG/2ML IJ SOLN
INTRAMUSCULAR | Status: AC
  Filled 2021-06-25: qty 2

## 2021-06-25 MED ORDER — ONDANSETRON HCL 4 MG/2ML IJ SOLN
INTRAMUSCULAR | Status: DC | PRN
  Administered 2021-06-25: 4 mg via INTRAVENOUS

## 2021-06-25 MED ORDER — CEFAZOLIN SODIUM-DEXTROSE 2-4 GM/100ML-% IV SOLN
2.0000 g | INTRAVENOUS | Status: AC
  Administered 2021-06-25: 2 g via INTRAVENOUS

## 2021-06-25 MED ORDER — ROCURONIUM BROMIDE 10 MG/ML (PF) SYRINGE
PREFILLED_SYRINGE | INTRAVENOUS | Status: AC
  Filled 2021-06-25: qty 10

## 2021-06-25 MED ORDER — IOHEXOL 180 MG/ML  SOLN
INTRAMUSCULAR | Status: DC | PRN
  Administered 2021-06-25: 10 mL

## 2021-06-25 MED ORDER — CEFAZOLIN SODIUM-DEXTROSE 2-4 GM/100ML-% IV SOLN
INTRAVENOUS | Status: AC
  Filled 2021-06-25: qty 100

## 2021-06-25 MED ORDER — MIDAZOLAM HCL 2 MG/2ML IJ SOLN
INTRAMUSCULAR | Status: DC | PRN
  Administered 2021-06-25: 2 mg via INTRAVENOUS

## 2021-06-25 MED ORDER — LIDOCAINE HCL (PF) 1 % IJ SOLN
INTRAMUSCULAR | Status: AC
  Filled 2021-06-25: qty 60

## 2021-06-25 MED ORDER — LACTATED RINGERS IV SOLN: INTRAVENOUS | Status: DC

## 2021-06-25 MED ORDER — ORAL CARE MOUTH RINSE: 15.0000 mL | Freq: Once | OROMUCOSAL | Status: AC

## 2021-06-25 SURGICAL SUPPLY — 21 items
CEMENT KYPHON CX01A KIT/MIXER (Cement) ×2 IMPLANT
DERMABOND ADVANCED (GAUZE/BANDAGES/DRESSINGS) ×1
DERMABOND ADVANCED .7 DNX12 (GAUZE/BANDAGES/DRESSINGS) ×1 IMPLANT
DEVICE BIOPSY BONE KYPHX (INSTRUMENTS) ×2 IMPLANT
DRAPE C-ARM XRAY 36X54 (DRAPES) ×2 IMPLANT
DURAPREP 26ML APPLICATOR (WOUND CARE) ×2 IMPLANT
GAUZE 4X4 16PLY ~~LOC~~+RFID DBL (SPONGE) ×2 IMPLANT
GLOVE SURG SYN 9.0  PF PI (GLOVE) ×1
GLOVE SURG SYN 9.0 PF PI (GLOVE) ×1 IMPLANT
GOWN SRG 2XL LVL 4 RGLN SLV (GOWNS) ×1 IMPLANT
GOWN STRL NON-REIN 2XL LVL4 (GOWNS) ×1
GOWN STRL REUS W/ TWL LRG LVL3 (GOWN DISPOSABLE) ×1 IMPLANT
GOWN STRL REUS W/TWL LRG LVL3 (GOWN DISPOSABLE) ×1
MANIFOLD NEPTUNE II (INSTRUMENTS) ×2 IMPLANT
PACK KYPHOPLASTY (MISCELLANEOUS) ×2 IMPLANT
RENTAL RFA GENERATOR (MISCELLANEOUS) IMPLANT
STRAP SAFETY 5IN WIDE (MISCELLANEOUS) ×2 IMPLANT
SWABSTK COMLB BENZOIN TINCTURE (MISCELLANEOUS) ×2 IMPLANT
TRAY KYPHOPAK 15/3 EXPRESS 1ST (MISCELLANEOUS) ×4 IMPLANT
TRAY KYPHOPAK 20/3 EXPRESS 1ST (MISCELLANEOUS) IMPLANT
WATER STERILE IRR 500ML POUR (IV SOLUTION) ×2 IMPLANT

## 2021-06-25 NOTE — H&P (Signed)
Chief Complaint  Patient presents with   Middle Back - Pain, Post Operative Visit    History of the Present Illness: Stacie [name] is a 65 y.o. female here today.   The patient presents for follow-up evaluation status post kyphoplasty at L2, L3, and L4. She has been having increasing pain recently. Date of surgery was 05/10/2021. She has required muscle relaxers intermittently.  The patient states she has not had a lot of tenderness, but she has pain when she tries to get up, move or get in bed. She states a couple of weeks ago she was coming down the stairs at her daughter's house and missed the last step. She did not fall or hit anything, but it was a jarring pain. It has stopped getting better since then, maybe a little worse. She has to have a walker to get up and move. She has difficulty getting in bed. Once she is there, she is okay. The patient states she is unable to hold her grandchild due to pain.   The patient takes 3 muscle relaxants and occasionally Tylenol. She takes baby aspirin.  I have reviewed past medical, surgical, social and family history, and allergies as documented in the EMR.  Past Medical History: Past Medical History:  Diagnosis Date   Allergic rhinitis 01/07/2015  Last Assessment & Plan: This has become more of a problem for her in the last few weeks. She continues to take the Nasonex appropriately. Plan: -Continue Nasonex -Start Zyrtec -If no improvement with Zyrtec he can prescribe Singulair   Asthma without status asthmaticus, unspecified   C. difficile diarrhea 01/18/2016   Chronic kidney disease (CKD), stage IV (severe) (CMS-HCC) 03/12/2013  07/2012 Cr 1.22; GFR 45 10/2013 Cr 1.1 Last Assessment & Plan: CKD related to sarcoid. GFR > 50 on last check. Will need to monitor CBC and CMET closely on imuran given that poor renal excretion can lead to a higher incidence of bone marrow suppression. Plan: -weekly CMET for 9 weeks   Cough 04/30/2013  Last Assessment &  Plan: This continues to be quite a problem and is related to acid reflux, sarcoid, and asthma. Plan: -use combination codeine/chlorpheniramine as needed for cough -Maximize acid reflux therapy by adding PPI during the day, H2 blocker at night, reviewed gastroesophageal reflux lifestyle modification changes   Cough variant asthma 02/20/2017  Last Assessment & Plan: She has a mild flare right now associated with worsening coughing and wheezing. Plan: Use Hycodan as needed for cough Prednisone 5 days don't take any other cough syrups other than the Hycodan Spirometry 02/20/2017 FEV1 1.25 (44%) Ratio 59 p Breo 200 - FENO 02/20/2017 = 12 p Breo 200 - 02/20/2017 After extensive coaching HFA effectiveness = 75% from baseline 25% So   Elevated blood pressure reading 04/20/2017   H/O abnormal cervical Papanicolaou smear 03/27/2015  S/p hysterectomy ~1990   H/O renal calculi 03/27/2015   History of stroke 2021   Hypercalcemia 05/23/2012   Hyperlipidemia, mixed 04/21/2017  The 10-year ASCVD risk score Mikey Bussing DC Jr., et al., 2013) is: 5.8% Values used to calculate the score: Age: 74 years Sex: Female Is Non-Hispanic African American: No Diabetic: No Tobacco smoker: No Systolic Blood Pressure: 741 mmHg Is BP treated: No HDL Cholesterol: 54 mg/dL Total Cholesterol: 230 mg/dL   Hypothyroidism, acquired, unspecified 03/27/2015   Kidney stone   Morbid obesity due to excess calories (CMS-HCC) 05/08/2017  Last Assessment & Plan: Formatting of this note might be different from the original. Body  mass index is 31.78 kg/m. - trending up on pred Lab Results Component Value Date TSH 0.362 (L) 04/20/2017 Contributing to gerd risk/ doe/reviewed the need and the process to achieve and maintain neg calorie balance > defer f/u primary care including intermittently monitoring thyroid status   Obstructive sleep apnea 09/29/2014  09/2014 PSG > AHI 16.8, O2 saturation nadir 72% 10/2014 ONO RA + CPAP < 88% 4 hours 03/2016 ONO RA + CPAP < 88% for 96 min  Last Assessment & Plan: Continue CPAP every night with oxygen. She would like to know if her oxygen level is still low. Plan: Overnight oximetry testing   Other malaise and fatigue 06/03/2014  Last Assessment & Plan: This has been a very difficult problem to assess. This point I do not think that her sarcoid has worsened considering the normal oximetry on ambulation today and the relative stability of her lung function tests in the last year. I explained to her that the differential diagnosis of fatigue is quite broad. I was hoping that she would see some improvement with CPAP therapy   Recurrent aspiration bronchitis/pneumonia 01/18/2016   Sarcoid   Past Surgical History: Past Surgical History:  Procedure Laterality Date   AMPUTATION TOE   COLON SURGERY   KYPHOPLASTY 05/10/2021  L2, L3, L4-Dr. Rudene Christians   LITHOTRIPSY   TUBAL LIGATION   Past Family History: History reviewed. No pertinent family history.  Medications: Current Outpatient Medications Ordered in Epic  Medication Sig Dispense Refill   acetaminophen (TYLENOL) 500 MG tablet Take 1 tablet by mouth every 6 (six) hours as needed   albuterol (PROVENTIL) 2.5 mg /3 mL (0.083 %) nebulizer solution Take 3 mLs (2.5 mg total) by nebulization every 6 (six) hours as needed for Wheezing 75 mL 12   aspirin 81 MG EC tablet Take 81 mg by mouth once daily   atorvastatin (LIPITOR) 40 MG tablet Take 40 mg by mouth once daily   brimonidine (ALPHAGAN) 0.2 % ophthalmic solution INSTILL 1 DROP INTO BOTH EYES TWICE A DAY   budesonide-formoteroL (SYMBICORT) 80-4.5 mcg/actuation inhaler Inhale 2 inhalations into the lungs 2 (two) times daily 1 Inhaler 11   conjugated estrogens (PREMARIN) 0.625 mg/gram vaginal cream Apply one pea-sized amount around the opening of the urethra three times weekly.   cyclobenzaprine (FLEXERIL) 5 MG tablet Take 1 tablet by mouth 3 (three) times daily as needed   denosumab (PROLIA) 60 mg/mL inj syringe Inject subcutaneously as  directed   dorzolamide (TRUSOPT) 2 % ophthalmic solution Apply to eye 2 (two) times daily   ergocalciferol, vitamin D2, 1,250 mcg (50,000 unit) capsule TAKE 1 CAPSULE BY MOUTH ONE TIME PER WEEK 8 capsule 0   escitalopram oxalate (LEXAPRO) 10 MG tablet Take 20 mg by mouth once daily Takes 20 mgs daily per pt.   folic acid (FOLVITE) 1 MG tablet Take 1 tablet (1,000 mcg total) by mouth once daily 90 tablet 3   FUROsemide (LASIX) 20 MG tablet TAKE 2 TABLETS (40 MG TOTAL) BY MOUTH ONCE DAILY AS NEEDED FOR EDEMA 180 tablet 1   HYDROcodone-acetaminophen (NORCO) 5-325 mg tablet Take 1 tablet by mouth every 4 (four) hours as needed 35 tablet 0   latanoprost (XALATAN) 0.005 % ophthalmic solution Apply to eye nightly   levothyroxine (SYNTHROID, LEVOTHROID) 137 MCG tablet Take 137 mcg by mouth once daily   methocarbamoL (ROBAXIN) 500 MG tablet Take 1 tablet (500 mg total) by mouth 4 (four) times daily as needed 60 tablet 2   methotrexate (RHEUMATREX)  2.5 MG tablet Take 10 tablets (25 mg total) by mouth every 7 (seven) days Take 10 tablets (25 mg total) by mouth every 7 days 120 tablet 0   mirabegron (MYRBETRIQ) 25 mg ER Tablet Take 25 mg by mouth once daily   multivitamin tablet Take 3 tablets by mouth 2 (two) times daily   OXYGEN-AIR DELIVERY SYSTEMS MISC Inhale 2 L into the lungs   OXYGEN-AIR DELIVERY SYSTEMS MISC Inhale 2 L into the lungs   potassium citrate (UROCIT-K) 10 mEq ER tablet Take 1 tablet by mouth 3 (three) times daily   predniSONE (DELTASONE) 1 MG tablet TAKE 4 TABLETS (4 MG TOTAL) BY MOUTH ONCE DAILY 360 tablet 3   predniSONE (DELTASONE) 10 MG tablet Take 1 tablet (10 mg total) by mouth once daily 30 tablet 0   sennosides-docusate (SENOKOT-S) 8.6-50 mg tablet Take 1 tablet by mouth 2 (two) times daily   treprostiniL-nebulizer-accesor (TYVASO STARTER KIT) 1.74 mg/2.9 mL Nebu Inhale into the lungs   TYVASO REFILL KIT 1.74 mg/2.9 mL (0.6 mg/mL) kit   VITAMIN B COMPLEX ORAL Take 1 tablet by  mouth once daily   ergocalciferol, vitamin D2, 1,250 mcg (50,000 unit) capsule TAKE 1 CAPSULE (50,000 UNITS TOTAL) BY MOUTH ONCE A WEEK FOR 8 DOSES 8 capsule 0   No current Epic-ordered facility-administered medications on file.   Allergies: Allergies  Allergen Reactions   Corn Other (See Comments)  Other reaction(s): HEADACHE Headache, diarrhea Other reaction(s): HEADACHE Headache, diarrhea   Corn Containing Products Diarrhea  headaches   Hydroxychloroquine Unknown   Macrobid [Nitrofurantoin Monohyd/M-Cryst] Nausea   Nitrofurantoin Nausea   Tramadol Vomiting  Nausea and vomiting Nausea and vomiting   Sulfa (Sulfonamide Antibiotics) Unknown and Rash  As an infant   Sulfasalazine Rash  As an infant    Body mass index is 26.84 kg/m.  Review of Systems: A comprehensive 14 point ROS was performed, reviewed, and the pertinent orthopaedic findings are documented in the HPI.  There were no vitals filed for this visit.   General Physical Examination:   General/Constitutional: No apparent distress: well-nourished and well developed. Eyes: Pupils equal, round with synchronous movement. Lungs: Clear to auscultation HEENT: Normal Vascular: No edema, swelling or tenderness, except as noted in detailed exam. Cardiac: Heart rate and rhythm is regular. Integumentary: No impressive skin lesions present, except as noted in detailed exam. Neuro/Psych: Normal mood and affect, oriented to person, place, and time.  On exam, tenderness to L1. No clonus bilaterally.  Radiographs:  AP and lateral x-rays of the thoracolumbar spine were ordered and personally reviewed today. These show what appears to be a new fracture at L1 with 25% superior endplate compression. There is a mild fracture at T12, which was present on prior x-rays, as well as slight fracture at T11. Prior x-rays from 05/15/2021 are used as a comparison.   X-ray Impression New compression fracture at L1, possibly at T11 as  well. She has had prior kyphoplasty further up in the thoracic spine as well at T7, T8, and T9.  Assessment: ICD-10-CM  1. Closed wedge compression fracture of T11 vertebra, initial encounter (CMS-HCC) S22.080A  2. Compression fracture of second lumbar vertebra with delayed healing S32.020G  3. Closed wedge compression fracture of L1 vertebra, initial encounter (CMS-HCC) S32.010A   Plan:  The patient has clinical findings of presumed L1 and possibly low thoracic multiple compression fractures.  We discussed the patient's x-ray findings. I explained she has a new fracture at L1 that does not  mean she needs more surgery. I recommend an MRI of her thoracic spine to make sure none of these 3 are new fractures as well. We will plan for MRI for evaluation and probable kyphoplasty following that.  Scribe Attestation: I, Dawn Royse, am acting as scribe for [employer], MD.   Electronically signed by Lauris Poag, MD at 06/23/2021 8:44 PM EDT  Subsequent MRI showed T10 12 and L1 compression fractures. Reviewed  H+P. No changes noted.

## 2021-06-25 NOTE — Anesthesia Preprocedure Evaluation (Signed)
Anesthesia Evaluation  Patient identified by MRN, date of birth, ID band Patient awake    Reviewed: Allergy & Precautions, NPO status , Patient's Chart, lab work & pertinent test results  History of Anesthesia Complications Negative for: history of anesthetic complications  Airway Mallampati: II  TM Distance: >3 FB Neck ROM: Full    Dental no notable dental hx.    Pulmonary asthma , sleep apnea ,  sarcoidosis   breath sounds clear to auscultation- rhonchi (-) wheezing      Cardiovascular hypertension, Pt. on medications (-) CAD, (-) Past MI, (-) Cardiac Stents and (-) CABG  Rhythm:Regular Rate:Normal - Systolic murmurs and - Diastolic murmurs Echo 1/61/09: 1. Left ventricular ejection fraction, by estimation, is 60 to 65%. The  left ventricle has normal function. Left ventricular endocardial border  not optimally defined to evaluate regional wall motion. Left ventricular  diastolic parameters are consistent  with Grade I diastolic dysfunction (impaired relaxation). Elevated left  atrial pressure. The average left ventricular global longitudinal strain  is -15.4 %. The global longitudinal strain is abnormal.  2. Right ventricular systolic function is normal. The right ventricular  size is normal. There is severely elevated pulmonary artery systolic  pressure.  3. The mitral valve is normal in structure. Trivial mitral valve  regurgitation. No evidence of mitral stenosis.  4. Tricuspid valve regurgitation is moderate.  5. The aortic valve is tricuspid. There is mild thickening of the aortic  valve. Aortic valve regurgitation is not visualized. Mild aortic valve  sclerosis is present, with no evidence of aortic valve stenosis.  6. Aortic dilatation noted. There is mild dilatation of the ascending  aorta, measuring 39 mm.  7. The inferior vena cava is normal in size with <50% respiratory  variability, suggesting right  atrial pressure of 8 mmHg.    Neuro/Psych neg Seizures PSYCHIATRIC DISORDERS Depression CVA    GI/Hepatic Neg liver ROS, GERD  ,  Endo/Other  neg diabetesHypothyroidism   Renal/GU Renal InsufficiencyRenal disease     Musculoskeletal negative musculoskeletal ROS (+)   Abdominal (+) - obese,   Peds  Hematology  (+) anemia ,   Anesthesia Other Findings Past Medical History: No date: Acute respiratory failure with hypoxia (HCC) No date: Arrhythmia     Comment:  patient unaware if this is current No date: Asthma No date: Chronic kidney disease No date: Depression No date: GERD (gastroesophageal reflux disease) No date: Heart murmur No date: History of kidney stones No date: HOH (hard of hearing)     Comment:  wear aids No date: Hyperthyroidism No date: Hypothyroidism No date: IBS (irritable bowel syndrome) No date: Pneumonia No date: Pulmonary hypertension ([place]) No date: Sarcoid No date: Sarcoidosis No date: Seasonal allergies CPAP with O2: Sleep apnea 07/2020: Stroke ([place])     Comment:  watershed No date: Wears hearing aid in both ears   Reproductive/Obstetrics                             Anesthesia Physical Anesthesia Plan  ASA: 4  Anesthesia Plan: General   Post-op Pain Management:    Induction: Intravenous  PONV Risk Score and Plan: 2 and Propofol infusion  Airway Management Planned: Natural Airway  Additional Equipment:   Intra-op Plan:   Post-operative Plan:   Informed Consent: I have reviewed the patients History and Physical, chart, labs and discussed the procedure including the risks, benefits and alternatives for the proposed anesthesia with the  patient or authorized representative who has indicated his/her understanding and acceptance.     Dental advisory given  Plan Discussed with: CRNA and Anesthesiologist  Anesthesia Plan Comments:         Anesthesia Quick Evaluation

## 2021-06-25 NOTE — Anesthesia Postprocedure Evaluation (Signed)
Anesthesia Post Note  Patient: Stacie [name]  Procedure(s) Performed: T10, T12, L1 KYPHOPLASTY  Patient location during evaluation: PACU Anesthesia Type: General Level of consciousness: awake and alert and oriented Pain management: pain level controlled Vital Signs Assessment: post-procedure vital signs reviewed and stable Respiratory status: spontaneous breathing, nonlabored ventilation and respiratory function stable Cardiovascular status: blood pressure returned to baseline and stable Postop Assessment: no signs of nausea or vomiting Anesthetic complications: no   No notable events documented.   Last Vitals:  Vitals:   06/25/21 1330 06/25/21 1341  BP: 129/64 132/65  Pulse: 95 (!) 101  Resp: 16 20  Temp: 36.6 C   SpO2: 99%     Last Pain:  Vitals:   06/25/21 1341  TempSrc: Oral  PainSc: 0-No pain                 Stacie [name]

## 2021-06-25 NOTE — Anesthesia Procedure Notes (Signed)
Date/Time: 06/25/2021 12:17 PM Performed by: Lily Peer, Marquavion Venhuizen, CRNA Pre-anesthesia Checklist: Patient identified, Emergency Drugs available, Suction available, Patient being monitored and Timeout performed Patient Re-evaluated:Patient Re-evaluated prior to induction Oxygen Delivery Method: Nasal cannula Induction Type: IV induction

## 2021-06-25 NOTE — Transfer of Care (Signed)
Immediate Anesthesia Transfer of Care Note  Patient: Stacie [name]  Procedure(s) Performed: T10, T12, L1 KYPHOPLASTY  Patient Location: PACU  Anesthesia Type:General  Level of Consciousness: awake, alert  and oriented  Airway & Oxygen Therapy: Patient Spontanous Breathing and Patient connected to nasal cannula oxygen  Post-op Assessment: Report given to RN and Post -op Vital signs reviewed and stable  Post vital signs: Reviewed and stable  Last Vitals:  Vitals Value Taken Time  BP 114/55   Temp    Pulse 100   Resp 12   SpO2 99     Last Pain:  Vitals:   06/25/21 1046  TempSrc: Oral  PainSc: 7          Complications: No notable events documented.

## 2021-06-25 NOTE — Discharge Instructions (Addendum)
Take it easy today and try to resume more normal activities tomorrow Pain medicine as directed Remove Band-Aids on Sunday then okay to shower Call office if you are having problems  AMBULATORY SURGERY  DISCHARGE INSTRUCTIONS   The drugs that you were given will stay in your system until tomorrow so for the next 24 hours you should not:  Drive an automobile Make any legal decisions Drink any alcoholic beverage   You may resume regular meals tomorrow.  Today it is better to start with liquids and gradually work up to solid foods.  You may eat anything you prefer, but it is better to start with liquids, then soup and crackers, and gradually work up to solid foods.   Please notify your doctor immediately if you have any unusual bleeding, trouble breathing, redness and pain at the surgery site, drainage, fever, or pain not relieved by medication.    Additional Instructions:     Please contact your physician with any problems or Same Day Surgery at [phone], Monday through Friday 6 am to 4 pm, or [place] at [facility] number at [phone].

## 2021-06-25 NOTE — Op Note (Signed)
06/25/2021  12:58 PM  PATIENT:  Stacie [name]  65 y.o. female  PRE-OPERATIVE DIAGNOSIS:  Closed wedge compression fracture of T11 vertebra, initial encounter  S22.080A Compression fracture of 2nd level vertebra with delayed healing S32.020G Closed wedge compression fracture of L1 vertebra, initial encounter  S32.010A T12 and T10 compression fractures  POST-OPERATIVE DIAGNOSIS:  Closed wedge compression fracture of T11 vertebra, initial encounter  S22.080A Compression fracture of 2nd level vertebra with delayed healing S32.020G Closed wedge compression fracture of L1 vertebra, initial encounter  S32.010A T10 and T12 compression fractures  PROCEDURE:  Procedure(s): T10, T12, L1 KYPHOPLASTY (N/A)  SURGEON: Laurene Footman, MD  ASSISTANTS: None  ANESTHESIA:   local and MAC  EBL:  Total I/O In: 600 [I.V.:600] Out: 2 [Blood:2]  BLOOD ADMINISTERED:none  DRAINS: none   LOCAL MEDICATIONS USED:  MARCAINE    and XYLOCAINE   SPECIMEN: T10, T12, L1 vertebral body biopsies  DISPOSITION OF SPECIMEN:  PATHOLOGY  COUNTS:  YES  TOURNIQUET:  * No tourniquets in log *  IMPLANTS: Bone cement  DICTATION: .Dragon Dictation   patient was brought to the operating room and after adequate anesthesia was obtained the patient was placed prone.  C arm was brought in in good visualization of the affected level obtained on both AP and lateral projections.  After patient identification and timeout procedures were completed, local anesthetic was infiltrated with 10 cc 1% Xylocaine infiltrated subcutaneously.  This is done the area on the each side of the planned approach.  The back was then prepped and draped in the usual sterile manner and repeat timeout procedure carried out.  A spinal needle was brought down to the pedicle on the right side of T10 and T12 and the left side of L1 and a 50-50 mix of 1% Xylocaine half percent Sensorcaine with epinephrine total of 20 cc injected on each side.  After  allowing this to set a small incision was made and the trocar was advanced into the vertebral body in an extrapedicular fashion.  Biopsy was obtained at each level.  Drilling was carried out balloon inserted with inflation to 2 cc on the right at T10 and 3 cc at T12 and 3 cc on the left at L1.  When the cement was appropriate consistency 3-1/2 cc were injected on the right at T10 and 4.5 cc a T12 and 4 cc on the left into the vertebral body at L1 without extravasation, good fill superior to inferior endplates and from right to left sides along the inferior endplate.  After the cement had set the trochar was removed and permanent C-arm views obtained.  The wounds were closed with Dermabond followed by Band-Aids  PLAN OF CARE: Discharge to home after PACU  PATIENT DISPOSITION:  PACU - hemodynamically stable.

## 2021-06-28 LAB — SURGICAL PATHOLOGY

## 2021-07-09 DIAGNOSIS — Z9889 Other specified postprocedural states: Secondary | ICD-10-CM | POA: Diagnosis not present

## 2021-07-09 DIAGNOSIS — S42294A Other nondisplaced fracture of upper end of right humerus, initial encounter for closed fracture: Secondary | ICD-10-CM | POA: Diagnosis not present

## 2021-07-23 DIAGNOSIS — S42294D Other nondisplaced fracture of upper end of right humerus, subsequent encounter for fracture with routine healing: Secondary | ICD-10-CM | POA: Diagnosis not present

## 2021-07-24 DIAGNOSIS — D869 Sarcoidosis, unspecified: Secondary | ICD-10-CM | POA: Diagnosis not present

## 2021-07-24 DIAGNOSIS — R0602 Shortness of breath: Secondary | ICD-10-CM | POA: Diagnosis not present

## 2021-08-14 DIAGNOSIS — G4733 Obstructive sleep apnea (adult) (pediatric): Secondary | ICD-10-CM | POA: Diagnosis not present

## 2021-08-18 ENCOUNTER — Other Ambulatory Visit: Payer: Self-pay | Admitting: Internal Medicine

## 2021-08-18 NOTE — Telephone Encounter (Signed)
Requested medications are due for refill today.  yes  Requested medications are on the active medications list.  yes  Last refill. 07/01/2021  Future visit scheduled.   no  Notes to clinic.  Pt is more than 3 months overdue for office visit.

## 2021-08-20 DIAGNOSIS — S42294D Other nondisplaced fracture of upper end of right humerus, subsequent encounter for fracture with routine healing: Secondary | ICD-10-CM | POA: Diagnosis not present

## 2021-08-24 DIAGNOSIS — D869 Sarcoidosis, unspecified: Secondary | ICD-10-CM | POA: Diagnosis not present

## 2021-08-24 DIAGNOSIS — R0602 Shortness of breath: Secondary | ICD-10-CM | POA: Diagnosis not present

## 2021-08-25 DIAGNOSIS — I270 Primary pulmonary hypertension: Secondary | ICD-10-CM | POA: Diagnosis not present

## 2021-08-25 DIAGNOSIS — R0600 Dyspnea, unspecified: Secondary | ICD-10-CM | POA: Diagnosis not present

## 2021-08-25 DIAGNOSIS — R0689 Other abnormalities of breathing: Secondary | ICD-10-CM | POA: Diagnosis not present

## 2021-08-25 DIAGNOSIS — I272 Pulmonary hypertension, unspecified: Secondary | ICD-10-CM | POA: Diagnosis not present

## 2021-08-25 DIAGNOSIS — Z23 Encounter for immunization: Secondary | ICD-10-CM | POA: Diagnosis not present

## 2021-09-14 DIAGNOSIS — G4733 Obstructive sleep apnea (adult) (pediatric): Secondary | ICD-10-CM | POA: Diagnosis not present

## 2021-09-17 DIAGNOSIS — S42294D Other nondisplaced fracture of upper end of right humerus, subsequent encounter for fracture with routine healing: Secondary | ICD-10-CM | POA: Diagnosis not present

## 2021-09-23 DIAGNOSIS — R0602 Shortness of breath: Secondary | ICD-10-CM | POA: Diagnosis not present

## 2021-09-23 DIAGNOSIS — D869 Sarcoidosis, unspecified: Secondary | ICD-10-CM | POA: Diagnosis not present

## 2021-09-28 ENCOUNTER — Telehealth (INDEPENDENT_AMBULATORY_CARE_PROVIDER_SITE_OTHER): Payer: BC Managed Care – PPO | Admitting: Internal Medicine

## 2021-09-28 ENCOUNTER — Telehealth: Payer: Self-pay

## 2021-09-28 ENCOUNTER — Encounter: Payer: Self-pay | Admitting: Internal Medicine

## 2021-09-28 VITALS — Temp 99.7°F | Ht 67.5 in

## 2021-09-28 DIAGNOSIS — U071 COVID-19: Secondary | ICD-10-CM

## 2021-09-28 MED ORDER — MOLNUPIRAVIR EUA 200MG CAPSULE: 4.0000 | ORAL_CAPSULE | Freq: Two times a day (BID) | ORAL | 0 refills | Status: DC

## 2021-09-28 MED ORDER — HYDROCOD POLST-CPM POLST ER 10-8 MG/5ML PO SUER: 5.0000 mL | Freq: Two times a day (BID) | ORAL | 0 refills | Status: AC

## 2021-09-28 NOTE — Progress Notes (Signed)
Date:  09/28/2021   Name:  Stacie [name]   DOB:  03-20-1956   MRN:  [id number]  This encounter was conducted via video encounter due to the need for social distancing in light of the Covid-19 pandemic.  The patient was correctly identified.  I advised that I am conducting the visit from a secure room in my office at [facility] clinic.  The patient is located at home. The limitations of this form of encounter were discussed with the patient and he/she agreed to proceed.  Some vital signs will be absent.  Unable to connect virtually so the encounter was done via telephone.  Chief Complaint: Covid Positive (Positive homes test this morning, symptoms started 1 day ago )  URI  This is a new problem. The current episode started today. The problem has been gradually worsening. Maximum temperature: 99.7. The fever has been present for Less than 1 day. Associated symptoms include chest pain, congestion, coughing, headaches, neck pain, sneezing, a sore throat and swollen glands. Pertinent negatives include no diarrhea, nausea, vomiting or wheezing. Associated symptoms comments: Chills, body aches, SOB . She has tried acetaminophen for the symptoms. The treatment provided mild relief.   Lab Results  Component Value Date   NA 143 05/11/2021   K 3.6 06/25/2021   CO2 33 (H) 05/11/2021   GLUCOSE 87 05/11/2021   BUN 34 (H) 05/11/2021   CREATININE 1.18 (H) 05/11/2021   CALCIUM 9.6 05/11/2021   GFRNONAA 51 (L) 05/11/2021   Lab Results  Component Value Date   CHOL 289 (H) 07/21/2020   HDL 33 (L) 07/21/2020   LDLCALC 198 (H) 07/21/2020   TRIG 290 (H) 07/21/2020   CHOLHDL 8.8 07/21/2020   Lab Results  Component Value Date   TSH 0.332 (L) 07/10/2020   Lab Results  Component Value Date   HGBA1C 6.4 (H) 07/16/2020   Lab Results  Component Value Date   WBC 13.6 (H) 05/11/2021   HGB 11.5 (L) 05/11/2021   HCT 35.2 (L) 05/11/2021   MCV 96.7 05/11/2021   PLT 337 05/11/2021   Lab Results   Component Value Date   ALT 23 05/08/2021   AST 26 05/08/2021   ALKPHOS 73 05/08/2021   BILITOT 0.8 05/08/2021   Lab Results  Component Value Date   VD25OH 28.3 (L) 05/09/2018     Review of Systems  Constitutional:  Positive for chills, fatigue and fever.  HENT:  Positive for congestion, sneezing and sore throat.   Respiratory:  Positive for cough and shortness of breath. Negative for chest tightness and wheezing.   Cardiovascular:  Positive for chest pain. Negative for palpitations.  Gastrointestinal:  Negative for diarrhea, nausea and vomiting.  Musculoskeletal:  Positive for neck pain.  Neurological:  Positive for headaches. Negative for dizziness and light-headedness.   Patient Active Problem List   Diagnosis Date Noted   Lumbar compression fracture ([place]) 05/11/2021   Open wound of foot 05/11/2021   Syncope and collapse 05/06/2021   Acquired absence of left great toe ([place]) 02/15/2021   Acquired absence of other left toe(s) ([place]) 02/15/2021   Acute on chronic respiratory failure with hypoxia ([place]) 01/31/2021   History of CVA (cerebrovascular accident) 01/31/2021   Gangrene of left foot ([place]) 11/03/2020   Labile blood glucose    Steroid-induced hyperglycemia    Thrombocytopenia (HCC)    Acute blood loss anemia    Slow transit constipation    AKI (acute kidney injury) ([place])  Supplemental oxygen dependent    Klebsiella pneumoniae sepsis [facility])    Critical lower limb ischemia (HCC)    Acute renal failure (HCC)    Cerebrovascular accident (CVA) due to embolism of cerebral artery ([place])    Sepsis ([place]) 07/10/2020   Pulmonary hypertension ([place]) 12/18/2018   Depression, major, recurrent, in partial remission ([place]) 10/22/2018   Compression fx, thoracic spine ([place]) 12/04/2017   Hyperlipidemia, mixed 04/21/2017   Cough variant asthma  vs UACS  02/20/2017   Hypothyroidism, acquired 03/27/2015   Essential hypertension 03/27/2015   Acquired absence of both cervix and uterus  03/27/2015   H/O renal calculi 03/27/2015   Allergic rhinitis 01/07/2015   Obstructive sleep apnea 09/29/2014   Other malaise and fatigue 06/03/2014   Asthma, chronic 02/24/2014   Cough 04/30/2013   Pulmonary sarcoidosis ([place]) 03/12/2013   CKD (chronic kidney disease) stage 3, GFR 30-59 ml/min (HCC) 03/12/2013   Calcium blood increased 05/23/2012    Allergies  Allergen Reactions   Corn-Containing Products     Upset stomach    Nitrofurantoin Nausea Only   Plaquenil [Hydroxychloroquine]     Unknown reaction    Tramadol Nausea And Vomiting   Sulfa Antibiotics Rash    As an infant    Past Surgical History:  Procedure Laterality Date   ABDOMINAL HYSTERECTOMY     partial   AMPUTATION Left 01/14/2021   Procedure: AMPUTATION RAY (1ST TOE ) ( 2ND METATARSAL HEAD RESECTION);  Surgeon: Algernon Huxley, MD;  Location: [facility];  Service: Vascular;  Laterality: Left;   CARDIAC CATHETERIZATION  10/18/2018   [facility]   CATARACT EXTRACTION W/PHACO Left 07/31/2019   Procedure: CATARACT EXTRACTION PHACO AND INTRAOCULAR LENS PLACEMENT (IOC) LEFT 00:51.1  17.9%  9.15;  Surgeon: Leandrew Koyanagi, MD;  Location: [facility];  Service: Ophthalmology;  Laterality: Left;  keep this patient second   COLON SURGERY     "colon was fused to bladder - operated on both"   COLONOSCOPY  09/18/2007   diverticuli, no polyps   COLONOSCOPY  05/26/2010   diverticuli, no polyps   CYSTOSCOPY WITH STENT PLACEMENT Bilateral 07/14/2020   Procedure: CYSTOSCOPY WITH STENT PLACEMENT, RETROPYLOGRAM;  Surgeon: Billey Co, MD;  Location: [facility];  Service: Urology;  Laterality: Bilateral;   CYSTOSCOPY/URETEROSCOPY/HOLMIUM LASER/STENT PLACEMENT Left 02/20/2020   Procedure: CYSTOSCOPY/URETEROSCOPY/LITHOTRIPSY /STENT PLACEMENT;  Surgeon: Hollice Espy, MD;  Location: [facility];  Service: Urology;  Laterality: Left;   CYSTOSCOPY/URETEROSCOPY/HOLMIUM LASER/STENT PLACEMENT Bilateral 10/02/2020   Procedure:  CYSTOSCOPY/URETEROSCOPY/HOLMIUM LASER/STENT PLACEMENT;  Surgeon: Billey Co, MD;  Location: [facility];  Service: Urology;  Laterality: Bilateral;   EYE SURGERY     KYPHOPLASTY N/A 05/10/2021   Procedure: KYPHOPLASTY, L2, L3, L4;  Surgeon: Hessie Knows, MD;  Location: [facility];  Service: Orthopedics;  Laterality: N/A;   KYPHOPLASTY N/A 06/25/2021   Procedure: T10, T12, L1 KYPHOPLASTY;  Surgeon: Hessie Knows, MD;  Location: [facility];  Service: Orthopedics;  Laterality: N/A;   PARS PLANA VITRECTOMY Right 05/20/2015   Procedure: PARS PLANA VITRECTOMY WITH 25 GAUGE, laser;  Surgeon: Milus Height, MD;  Location: [facility];  Service: Ophthalmology;  Laterality: Right;   PARTIAL HYSTERECTOMY  1990   TEE WITHOUT CARDIOVERSION N/A 07/27/2020   Procedure: TRANSESOPHAGEAL ECHOCARDIOGRAM (TEE);  Surgeon: Teodoro Spray, MD;  Location: [facility];  Service: Cardiovascular;  Laterality: N/A;   TUBAL LIGATION     WISDOM TOOTH EXTRACTION      Social History   Tobacco Use   Smoking status:  Never   Smokeless tobacco: Never  Vaping Use   Vaping Use: Never used  Substance Use Topics   Alcohol use: No   Drug use: No     Medication list has been reviewed and updated.  Current Meds  Medication Sig   acetaminophen (TYLENOL) 500 MG tablet Take 500 mg by mouth every 6 (six) hours as needed for moderate pain or headache.   albuterol (PROVENTIL) (2.5 MG/3ML) 0.083% nebulizer solution Inhale 2.5 mg into the lungs every 6 (six) hours as needed for wheezing or shortness of breath.   B Complex Vitamins (VITAMIN B COMPLEX PO) Take 1 tablet by mouth once a week.   budesonide-formoterol (SYMBICORT) 80-4.5 MCG/ACT inhaler Take 2 puffs first thing in am and then another 2 puffs about 12 hours later. (Patient taking differently: Inhale 2 puffs into the lungs in the morning and at bedtime.)   chlorpheniramine-HYDROcodone (TUSSIONEX PENNKINETIC ER) 10-8 MG/5ML SUER Take 5 mLs by mouth 2 (two) times daily for 8 days.    denosumab (PROLIA) 60 MG/ML SOSY injection Inject 60 mg into the skin every 6 (six) months.   dorzolamide (TRUSOPT) 2 % ophthalmic solution Place 1 drop into both eyes 2 (two) times daily.   ergocalciferol (VITAMIN D2) 1.25 MG (50000 UT) capsule Take 50,000 Units by mouth every Tuesday.   escitalopram (LEXAPRO) 20 MG tablet TAKE 1 TABLET BY MOUTH EVERY DAY   folic acid (FOLVITE) 1 MG tablet Take 1 tablet (1 mg total) by mouth daily.   HYDROcodone-acetaminophen (NORCO) 5-325 MG tablet Take 1 tablet by mouth every 6 (six) hours as needed for moderate pain.   latanoprost (XALATAN) 0.005 % ophthalmic solution Place 1 drop into the right eye 2 (two) times daily.   levothyroxine (SYNTHROID) 137 MCG tablet TAKE 1 TABLET (137 MCG TOTAL) BY MOUTH DAILY BEFORE BREAKFAST. (Patient taking differently: Take 137 mcg by mouth daily before breakfast.)   Menthol (ICY HOT) 5 % PTCH Apply 1 patch topically daily as needed (pain).   methocarbamol (ROBAXIN) 500 MG tablet Take 500 mg by mouth as needed.   methotrexate (RHEUMATREX) 2.5 MG tablet Take 10 tablets (25 mg total) by mouth every Tuesday.   molnupiravir EUA (LAGEVRIO) 200 mg CAPS capsule Take 4 capsules (800 mg total) by mouth 2 (two) times daily for 5 days.   Multiple Vitamins-Minerals (MULTIVITAMIN WITH MINERALS) tablet Take 1 tablet by mouth daily.   naproxen sodium (ALEVE) 220 MG tablet Take 220 mg by mouth daily as needed (pain).   NON FORMULARY CPAP nightly   OXYGEN Inhale 2 L into the lungs daily as needed (During walking and activity).    potassium citrate (UROCIT-K) 10 MEQ (1080 MG) SR tablet Take 20 mEq by mouth 2 (two) times daily.   predniSONE (DELTASONE) 10 MG tablet Take 19 mg by mouth daily.   Probiotic Product (PROBIOTIC PO) Take 1 capsule by mouth daily.   Treprostinil (TYVASO) 0.6 MG/ML SOLN Inhale into the lungs See admin instructions. 12 breaths 4 times a day   [DISCONTINUED] aspirin EC 81 MG EC tablet Take 1 tablet (81 mg total) by  mouth daily. Swallow whole.    PHQ 2/9 Scores 09/28/2021 09/21/2020 01/07/2020 10/21/2019  PHQ - 2 Score 0 0 0 0  PHQ- 9 Score 0 2 0 0    GAD 7 : Generalized Anxiety Score 09/28/2021 09/21/2020 04/26/2019  Nervous, Anxious, on Edge 0 0 1  Control/stop worrying 0 0 0  Worry too much - different things 0 0 1  Trouble relaxing 0 0 0  Restless 0 0 0  Easily annoyed or irritable 0 0 1  Afraid - awful might happen 0 0 0  Total GAD 7 Score 0 0 3  Anxiety Difficulty Not difficult at all Not difficult at all Not difficult at all    BP Readings from Last 3 Encounters:  06/25/21 132/65  05/11/21 (!) 148/77  04/21/21 (!) 174/112    Physical Exam Pulmonary:     Comments: No cough or dyspnea noted during the call Neurological:     Mental Status: She is alert.  Psychiatric:        Attention and Perception: Attention normal.        Mood and Affect: Mood normal.        Speech: Speech normal.        Cognition and Memory: Cognition normal.    Wt Readings from Last 3 Encounters:  06/25/21 170 lb (77.1 kg)  05/10/21 167 lb 15.9 oz (76.2 kg)  04/21/21 168 lb (76.2 kg)    Temp 99.7 F (37.6 C)    Ht 5' 7.5" (1.715 m)    SpO2 98%    BMI 26.23 kg/m   Assessment and Plan: 1. COVID-19 virus infection Take Tylenol 650 - 1000 mg every 6-8 hours for fever, body aches and headache. Drink plenty of fluids with electrolytes. Monitor for fever that does not decrease and/or shortness of breath that worsens or is present at rest. - molnupiravir EUA (LAGEVRIO) 200 mg CAPS capsule; Take 4 capsules (800 mg total) by mouth 2 (two) times daily for 5 days.  Dispense: 40 capsule; Refill: 0 - chlorpheniramine-HYDROcodone (TUSSIONEX PENNKINETIC ER) 10-8 MG/5ML SUER; Take 5 mLs by mouth 2 (two) times daily for 8 days.  Dispense: 115 mL; Refill: 0  I spent 8 minutes on this call, 100% of the time via telephone. Partially dictated using Editor, commissioning. Any errors are unintentional.  Halina Maidens, MD [facility]  09/28/2021

## 2021-09-28 NOTE — Telephone Encounter (Signed)
This visit type is being conducted due to national recommendations for restrictions regarding the COVID- 19 Pandemic (e.g. social distancing) in effort to limit this patients exposure and mitigate transmission in our community. This visit type is felt to be most appropriate for this patient at this time.   I connected with the patient today and received telephone consent from the patient and patient understand this consent will be good for 1 year. Patient did not have any questions or concerns during this consent.   KP

## 2021-09-29 ENCOUNTER — Telehealth: Payer: Self-pay | Admitting: Internal Medicine

## 2021-09-29 ENCOUNTER — Other Ambulatory Visit: Payer: Self-pay

## 2021-09-29 DIAGNOSIS — U071 COVID-19: Secondary | ICD-10-CM

## 2021-09-29 MED ORDER — MOLNUPIRAVIR EUA 200MG CAPSULE
4.0000 | ORAL_CAPSULE | Freq: Two times a day (BID) | ORAL | 0 refills | Status: AC
Start: 2021-09-29 — End: 2021-10-04
  Filled 2021-09-29: qty 40, 5d supply, fill #0

## 2021-09-29 NOTE — Telephone Encounter (Signed)
Stacie [name] calling to advise all the [facility] were out of the antiviral medication molnupiravir EUA (LAGEVRIO) 200 mg CAPS capsule  She is requesting you send to another pharmacy. ( I have heard [facility] has the medication) Please let her know where you can send.Marland Kitchen

## 2021-10-05 ENCOUNTER — Other Ambulatory Visit: Payer: Self-pay

## 2021-10-13 ENCOUNTER — Telehealth (INDEPENDENT_AMBULATORY_CARE_PROVIDER_SITE_OTHER): Payer: BC Managed Care – PPO | Admitting: Internal Medicine

## 2021-10-13 ENCOUNTER — Telehealth: Payer: Self-pay

## 2021-10-13 ENCOUNTER — Encounter: Payer: Self-pay | Admitting: Internal Medicine

## 2021-10-13 VITALS — BP 127/77 | HR 90 | Temp 98.3°F | Ht 67.5 in | Wt 170.0 lb

## 2021-10-13 DIAGNOSIS — U071 COVID-19: Secondary | ICD-10-CM | POA: Diagnosis not present

## 2021-10-13 DIAGNOSIS — J40 Bronchitis, not specified as acute or chronic: Secondary | ICD-10-CM | POA: Diagnosis not present

## 2021-10-13 MED ORDER — CEFDINIR 300 MG PO CAPS: 300.0000 mg | ORAL_CAPSULE | Freq: Two times a day (BID) | ORAL | 0 refills | Status: DC

## 2021-10-13 MED ORDER — PREDNISONE 10 MG PO TABS: 50.0000 mg | ORAL_TABLET | Freq: Every day | ORAL | 0 refills | Status: AC

## 2021-10-13 MED ORDER — HYDROCODONE BIT-HOMATROP MBR 5-1.5 MG/5ML PO SOLN
5.0000 mL | Freq: Four times a day (QID) | ORAL | 0 refills | Status: DC | PRN
Start: 2021-10-13 — End: 2021-10-26

## 2021-10-13 NOTE — Telephone Encounter (Signed)
Ms. Stacie [name], Stacie [name] are scheduled for a virtual visit with your provider today.    Just as we do with appointments in the office, we must obtain your consent to participate.  Your consent will be active for this visit and any virtual visit you may have with one of our providers in the next 365 days.    If you have a MyChart account, I can also send a copy of this consent to you electronically.  All virtual visits are billed to your insurance company just like a traditional visit in the office.  As this is a virtual visit, video technology does not allow for your provider to perform a traditional examination.  This may limit your provider's ability to fully assess your condition.  If your provider identifies any concerns that need to be evaluated in person or the need to arrange testing such as labs, EKG, etc, we will make arrangements to do so.    Although advances in technology are sophisticated, we cannot ensure that it will always work on either your end or our end.  If the connection with a video visit is poor, we may have to switch to a telephone visit.  With either a video or telephone visit, we are not always able to ensure that we have a secure connection.   I need to obtain your verbal consent now.   Are you willing to proceed with your visit today?   Stacie [name] has provided verbal consent on 10/13/2021 for a virtual visit (video or telephone).

## 2021-10-13 NOTE — Progress Notes (Signed)
Date:  10/13/2021   Name:  Stacie [name]   DOB:  05-05-1956   MRN:  [id number]   Chief Complaint: Fever  Virtual Visit via Video Note  I connected with Stacie [name] on 10/13/21 at 10:40 AM EST by a video enabled telemedicine application and verified that I am speaking with the correct person using two identifiers.  Location: Patient: HOME and patient is with daughter Stacie [name]. Provider: OFFICE   I discussed the limitations of evaluation and management by telemedicine and the availability of in person appointments. The patient expressed understanding and agreed to proceed.   Fever  This is a new problem. The current episode started 1 to 4 weeks ago. The problem has been waxing and waning. Associated symptoms include congestion, coughing, muscle aches, vomiting (occasional with lightheadedness) and wheezing. Pertinent negatives include no chest pain, diarrhea, headaches or sore throat.  Cough This is a new problem. The current episode started yesterday. The problem has been gradually worsening. The problem occurs every few minutes. The cough is Productive of sputum. Associated symptoms include ear congestion, a fever, nasal congestion, postnasal drip, shortness of breath and wheezing. Pertinent negatives include no chest pain, chills, headaches, hemoptysis or sore throat. She has tried a beta-agonist inhaler for the symptoms. recent Covid infection Patient tested Positive for Covid on 09/28/22. Patient took Paxlovid and she felt better, but symptoms are back.  Lab Results  Component Value Date   NA 143 05/11/2021   K 3.6 06/25/2021   CO2 33 (H) 05/11/2021   GLUCOSE 87 05/11/2021   BUN 34 (H) 05/11/2021   CREATININE 1.18 (H) 05/11/2021   CALCIUM 9.6 05/11/2021   GFRNONAA 51 (L) 05/11/2021   Lab Results  Component Value Date   CHOL 289 (H) 07/21/2020   HDL 33 (L) 07/21/2020   LDLCALC 198 (H) 07/21/2020   TRIG 290 (H) 07/21/2020   CHOLHDL 8.8 07/21/2020   Lab Results   Component Value Date   TSH 0.332 (L) 07/10/2020   Lab Results  Component Value Date   HGBA1C 6.4 (H) 07/16/2020   Lab Results  Component Value Date   WBC 13.6 (H) 05/11/2021   HGB 11.5 (L) 05/11/2021   HCT 35.2 (L) 05/11/2021   MCV 96.7 05/11/2021   PLT 337 05/11/2021   Lab Results  Component Value Date   ALT 23 05/08/2021   AST 26 05/08/2021   ALKPHOS 73 05/08/2021   BILITOT 0.8 05/08/2021   Lab Results  Component Value Date   VD25OH 28.3 (L) 05/09/2018     Review of Systems  Constitutional:  Positive for fatigue and fever. Negative for chills.  HENT:  Positive for congestion, hearing loss and postnasal drip. Negative for sore throat and trouble swallowing.   Respiratory:  Positive for cough, shortness of breath and wheezing. Negative for hemoptysis and chest tightness.   Cardiovascular:  Negative for chest pain.  Gastrointestinal:  Positive for vomiting (occasional with lightheadedness). Negative for diarrhea.  Neurological:  Negative for dizziness and headaches.   Patient Active Problem List   Diagnosis Date Noted   Lumbar compression fracture ([place]) 05/11/2021   Open wound of foot 05/11/2021   Syncope and collapse 05/06/2021   Acquired absence of left great toe ([place]) 02/15/2021   Acquired absence of other left toe(s) ([place]) 02/15/2021   Acute on chronic respiratory failure with hypoxia ([place]) 01/31/2021   History of CVA (cerebrovascular accident) 01/31/2021   Gangrene of left foot ([place]) 11/03/2020   Labile blood  glucose    Steroid-induced hyperglycemia    Thrombocytopenia (HCC)    Acute blood loss anemia    Slow transit constipation    AKI (acute kidney injury) ([place])    Supplemental oxygen dependent    Klebsiella pneumoniae sepsis [facility])    Critical lower limb ischemia (HCC)    Acute renal failure (HCC)    Cerebrovascular accident (CVA) due to embolism of cerebral artery ([place])    Sepsis ([place]) 07/10/2020   Pulmonary hypertension ([place]) 12/18/2018    Depression, major, recurrent, in partial remission ([place]) 10/22/2018   Compression fx, thoracic spine ([place]) 12/04/2017   Hyperlipidemia, mixed 04/21/2017   Cough variant asthma  vs UACS  02/20/2017   Hypothyroidism, acquired 03/27/2015   Essential hypertension 03/27/2015   Acquired absence of both cervix and uterus 03/27/2015   H/O renal calculi 03/27/2015   Allergic rhinitis 01/07/2015   Obstructive sleep apnea 09/29/2014   Other malaise and fatigue 06/03/2014   Asthma, chronic 02/24/2014   Cough 04/30/2013   Pulmonary sarcoidosis ([place]) 03/12/2013   CKD (chronic kidney disease) stage 3, GFR 30-59 ml/min (HCC) 03/12/2013   Calcium blood increased 05/23/2012    Allergies  Allergen Reactions   Corn-Containing Products     Upset stomach    Nitrofurantoin Nausea Only   Plaquenil [Hydroxychloroquine]     Unknown reaction    Tramadol Nausea And Vomiting   Sulfa Antibiotics Rash    As an infant    Past Surgical History:  Procedure Laterality Date   ABDOMINAL HYSTERECTOMY     partial   AMPUTATION Left 01/14/2021   Procedure: AMPUTATION RAY (1ST TOE ) ( 2ND METATARSAL HEAD RESECTION);  Surgeon: Algernon Huxley, MD;  Location: [facility];  Service: Vascular;  Laterality: Left;   CARDIAC CATHETERIZATION  10/18/2018   [facility]   CATARACT EXTRACTION W/PHACO Left 07/31/2019   Procedure: CATARACT EXTRACTION PHACO AND INTRAOCULAR LENS PLACEMENT (IOC) LEFT 00:51.1  17.9%  9.15;  Surgeon: Leandrew Koyanagi, MD;  Location: [place];  Service: Ophthalmology;  Laterality: Left;  keep this patient second   COLON SURGERY     "colon was fused to bladder - operated on both"   COLONOSCOPY  09/18/2007   diverticuli, no polyps   COLONOSCOPY  05/26/2010   diverticuli, no polyps   CYSTOSCOPY WITH STENT PLACEMENT Bilateral 07/14/2020   Procedure: CYSTOSCOPY WITH STENT PLACEMENT, RETROPYLOGRAM;  Surgeon: Billey Co, MD;  Location: [facility];  Service: Urology;  Laterality: Bilateral;    CYSTOSCOPY/URETEROSCOPY/HOLMIUM LASER/STENT PLACEMENT Left 02/20/2020   Procedure: CYSTOSCOPY/URETEROSCOPY/LITHOTRIPSY /STENT PLACEMENT;  Surgeon: Hollice Espy, MD;  Location: [facility];  Service: Urology;  Laterality: Left;   CYSTOSCOPY/URETEROSCOPY/HOLMIUM LASER/STENT PLACEMENT Bilateral 10/02/2020   Procedure: CYSTOSCOPY/URETEROSCOPY/HOLMIUM LASER/STENT PLACEMENT;  Surgeon: Billey Co, MD;  Location: [facility];  Service: Urology;  Laterality: Bilateral;   EYE SURGERY     KYPHOPLASTY N/A 05/10/2021   Procedure: KYPHOPLASTY, L2, L3, L4;  Surgeon: Hessie Knows, MD;  Location: [facility];  Service: Orthopedics;  Laterality: N/A;   KYPHOPLASTY N/A 06/25/2021   Procedure: T10, T12, L1 KYPHOPLASTY;  Surgeon: Hessie Knows, MD;  Location: [facility];  Service: Orthopedics;  Laterality: N/A;   PARS PLANA VITRECTOMY Right 05/20/2015   Procedure: PARS PLANA VITRECTOMY WITH 25 GAUGE, laser;  Surgeon: Milus Height, MD;  Location: [facility];  Service: Ophthalmology;  Laterality: Right;   PARTIAL HYSTERECTOMY  1990   TEE WITHOUT CARDIOVERSION N/A 07/27/2020   Procedure: TRANSESOPHAGEAL ECHOCARDIOGRAM (TEE);  Surgeon: Teodoro Spray, MD;  Location: [facility];  Service: Cardiovascular;  Laterality: N/A;   TUBAL LIGATION     WISDOM TOOTH EXTRACTION      Social History   Tobacco Use   Smoking status: Never   Smokeless tobacco: Never  Vaping Use   Vaping Use: Never used  Substance Use Topics   Alcohol use: No   Drug use: No     Medication list has been reviewed and updated.  Current Meds  Medication Sig   acetaminophen (TYLENOL) 500 MG tablet Take 500 mg by mouth every 6 (six) hours as needed for moderate pain or headache.   albuterol (PROVENTIL) (2.5 MG/3ML) 0.083% nebulizer solution Inhale 2.5 mg into the lungs every 6 (six) hours as needed for wheezing or shortness of breath.   budesonide-formoterol (SYMBICORT) 80-4.5 MCG/ACT inhaler Take 2 puffs first thing in am and then another 2 puffs  about 12 hours later. (Patient taking differently: Inhale 2 puffs into the lungs in the morning and at bedtime.)   cyclobenzaprine (FLEXERIL) 5 MG tablet Take 5-10 mg by mouth 3 (three) times daily as needed.   denosumab (PROLIA) 60 MG/ML SOSY injection Inject 60 mg into the skin every 6 (six) months.   dorzolamide (TRUSOPT) 2 % ophthalmic solution Place 1 drop into both eyes 2 (two) times daily.   escitalopram (LEXAPRO) 20 MG tablet TAKE 1 TABLET BY MOUTH EVERY DAY   folic acid (FOLVITE) 1 MG tablet Take 1 tablet (1 mg total) by mouth daily.   HYDROcodone-acetaminophen (NORCO) 5-325 MG tablet Take 1 tablet by mouth every 6 (six) hours as needed for moderate pain.   latanoprost (XALATAN) 0.005 % ophthalmic solution Place 1 drop into the right eye 2 (two) times daily.   levothyroxine (SYNTHROID) 137 MCG tablet TAKE 1 TABLET (137 MCG TOTAL) BY MOUTH DAILY BEFORE BREAKFAST. (Patient taking differently: Take 137 mcg by mouth daily before breakfast.)   Menthol (ICY HOT) 5 % PTCH Apply 1 patch topically daily as needed (pain).   methotrexate (RHEUMATREX) 2.5 MG tablet Take 10 tablets (25 mg total) by mouth every Tuesday.   Multiple Vitamins-Minerals (MULTIVITAMIN WITH MINERALS) tablet Take 1 tablet by mouth daily.   naproxen sodium (ALEVE) 220 MG tablet Take 220 mg by mouth daily as needed (pain).   NON FORMULARY CPAP nightly   OXYGEN Inhale 2 L into the lungs daily as needed (During walking and activity).    potassium citrate (UROCIT-K) 10 MEQ (1080 MG) SR tablet Take 20 mEq by mouth 2 (two) times daily.   predniSONE (DELTASONE) 10 MG tablet Take 19 mg by mouth daily.   Probiotic Product (PROBIOTIC PO) Take 1 capsule by mouth daily.   Treprostinil (TYVASO) 0.6 MG/ML SOLN Inhale into the lungs See admin instructions. 12 breaths 4 times a day    PHQ 2/9 Scores 10/13/2021 09/28/2021 09/21/2020 01/07/2020  PHQ - 2 Score 0 0 0 0  PHQ- 9 Score 0 0 2 0    GAD 7 : Generalized Anxiety Score 10/13/2021  09/28/2021 09/21/2020 04/26/2019  Nervous, Anxious, on Edge 0 0 0 1  Control/stop worrying 0 0 0 0  Worry too much - different things 0 0 0 1  Trouble relaxing 0 0 0 0  Restless 0 0 0 0  Easily annoyed or irritable 0 0 0 1  Afraid - awful might happen 0 0 0 0  Total GAD 7 Score 0 0 0 3  Anxiety Difficulty Not difficult at all Not difficult at all Not difficult at all  Not difficult at all    BP Readings from Last 3 Encounters:  10/13/21 127/77  06/25/21 132/65  05/11/21 (!) 148/77    Physical Exam Constitutional:      Appearance: She is ill-appearing.  Pulmonary:     Effort: Pulmonary effort is normal.     Comments: Frequent loose cough during the visit Neurological:     General: No focal deficit present.     Mental Status: She is alert.  Psychiatric:        Mood and Affect: Mood normal.    Wt Readings from Last 3 Encounters:  10/13/21 170 lb (77.1 kg)  06/25/21 170 lb (77.1 kg)  05/10/21 167 lb 15.9 oz (76.2 kg)    BP 127/77    Pulse 90    Temp 98.3 F (36.8 C) (Oral)    Ht 5' 7.5" (1.715 m)    Wt 170 lb (77.1 kg)    LMP  (LMP Unknown)    SpO2 94%    BMI 26.23 kg/m   Assessment and Plan: 1. Bronchitis due to COVID-19 virus Concern for bacterial infection after Covid-19 infection Will ask her husband to listen to her chest and look at her ears. Continue fluids and albuterol nebs; Tylenol for fever. Pulse dose steroids since she takes 19 mg daily baseline. If no improvement in the next 4-5 days, would recommend CXR - cefdinir (OMNICEF) 300 MG capsule; Take 1 capsule (300 mg total) by mouth 2 (two) times daily for 10 days.  Dispense: 20 capsule; Refill: 0 - HYDROcodone bit-homatropine (HYCODAN) 5-1.5 MG/5ML syrup; Take 5 mLs by mouth every 6 (six) hours as needed for up to 10 days for cough.  Dispense: 120 mL; Refill: 0 - predniSONE (DELTASONE) 10 MG tablet; Take 5 tablets (50 mg total) by mouth daily with breakfast for 3 days.  Dispense: 15 tablet; Refill: 0  I spent  15 minutes on this encounter, 100% of the visit via video.  Partially dictated using Editor, commissioning. Any errors are unintentional.  Halina Maidens, MD [facility]  10/13/2021

## 2021-10-14 DIAGNOSIS — G4733 Obstructive sleep apnea (adult) (pediatric): Secondary | ICD-10-CM | POA: Diagnosis not present

## 2021-10-19 ENCOUNTER — Other Ambulatory Visit: Payer: Self-pay

## 2021-10-19 ENCOUNTER — Emergency Department: Payer: BC Managed Care – PPO

## 2021-10-19 ENCOUNTER — Inpatient Hospital Stay
Admission: EM | Admit: 2021-10-19 | Discharge: 2021-10-26 | DRG: 177 | Disposition: A | Discharge status: Home Health | Payer: BC Managed Care – PPO | Attending: Internal Medicine | Admitting: Internal Medicine

## 2021-10-19 DIAGNOSIS — Z7951 Long term (current) use of inhaled steroids: Secondary | ICD-10-CM

## 2021-10-19 DIAGNOSIS — Z803 Family history of malignant neoplasm of breast: Secondary | ICD-10-CM | POA: Diagnosis not present

## 2021-10-19 DIAGNOSIS — J9811 Atelectasis: Secondary | ICD-10-CM | POA: Diagnosis not present

## 2021-10-19 DIAGNOSIS — Z9981 Dependence on supplemental oxygen: Secondary | ICD-10-CM | POA: Diagnosis not present

## 2021-10-19 DIAGNOSIS — Z7952 Long term (current) use of systemic steroids: Secondary | ICD-10-CM | POA: Diagnosis not present

## 2021-10-19 DIAGNOSIS — S91109A Unspecified open wound of unspecified toe(s) without damage to nail, initial encounter: Secondary | ICD-10-CM

## 2021-10-19 DIAGNOSIS — L089 Local infection of the skin and subcutaneous tissue, unspecified: Secondary | ICD-10-CM | POA: Diagnosis present

## 2021-10-19 DIAGNOSIS — L98491 Non-pressure chronic ulcer of skin of other sites limited to breakdown of skin: Secondary | ICD-10-CM | POA: Diagnosis present

## 2021-10-19 DIAGNOSIS — Z66 Do not resuscitate: Secondary | ICD-10-CM | POA: Diagnosis present

## 2021-10-19 DIAGNOSIS — J157 Pneumonia due to Mycoplasma pneumoniae: Secondary | ICD-10-CM | POA: Diagnosis not present

## 2021-10-19 DIAGNOSIS — R0602 Shortness of breath: Secondary | ICD-10-CM | POA: Diagnosis not present

## 2021-10-19 DIAGNOSIS — Z91018 Allergy to other foods: Secondary | ICD-10-CM

## 2021-10-19 DIAGNOSIS — K219 Gastro-esophageal reflux disease without esophagitis: Secondary | ICD-10-CM | POA: Diagnosis not present

## 2021-10-19 DIAGNOSIS — J45901 Unspecified asthma with (acute) exacerbation: Secondary | ICD-10-CM | POA: Diagnosis present

## 2021-10-19 DIAGNOSIS — J9601 Acute respiratory failure with hypoxia: Secondary | ICD-10-CM

## 2021-10-19 DIAGNOSIS — Z974 Presence of external hearing-aid: Secondary | ICD-10-CM

## 2021-10-19 DIAGNOSIS — U071 COVID-19: Principal | ICD-10-CM | POA: Diagnosis present

## 2021-10-19 DIAGNOSIS — E039 Hypothyroidism, unspecified: Secondary | ICD-10-CM | POA: Diagnosis not present

## 2021-10-19 DIAGNOSIS — Z79890 Hormone replacement therapy (postmenopausal): Secondary | ICD-10-CM

## 2021-10-19 DIAGNOSIS — R069 Unspecified abnormalities of breathing: Secondary | ICD-10-CM | POA: Diagnosis not present

## 2021-10-19 DIAGNOSIS — L97522 Non-pressure chronic ulcer of other part of left foot with fat layer exposed: Secondary | ICD-10-CM | POA: Diagnosis not present

## 2021-10-19 DIAGNOSIS — D860 Sarcoidosis of lung: Secondary | ICD-10-CM | POA: Diagnosis present

## 2021-10-19 DIAGNOSIS — H9190 Unspecified hearing loss, unspecified ear: Secondary | ICD-10-CM | POA: Diagnosis not present

## 2021-10-19 DIAGNOSIS — R600 Localized edema: Secondary | ICD-10-CM | POA: Diagnosis not present

## 2021-10-19 DIAGNOSIS — J9621 Acute and chronic respiratory failure with hypoxia: Secondary | ICD-10-CM | POA: Diagnosis present

## 2021-10-19 DIAGNOSIS — F32A Depression, unspecified: Secondary | ICD-10-CM | POA: Diagnosis present

## 2021-10-19 DIAGNOSIS — Z79631 Long term (current) use of antimetabolite agent: Secondary | ICD-10-CM

## 2021-10-19 DIAGNOSIS — R911 Solitary pulmonary nodule: Secondary | ICD-10-CM | POA: Diagnosis not present

## 2021-10-19 DIAGNOSIS — G4733 Obstructive sleep apnea (adult) (pediatric): Secondary | ICD-10-CM | POA: Diagnosis present

## 2021-10-19 DIAGNOSIS — J9600 Acute respiratory failure, unspecified whether with hypoxia or hypercapnia: Secondary | ICD-10-CM

## 2021-10-19 DIAGNOSIS — M069 Rheumatoid arthritis, unspecified: Secondary | ICD-10-CM | POA: Diagnosis present

## 2021-10-19 DIAGNOSIS — I272 Pulmonary hypertension, unspecified: Secondary | ICD-10-CM | POA: Diagnosis present

## 2021-10-19 DIAGNOSIS — Z800 Family history of malignant neoplasm of digestive organs: Secondary | ICD-10-CM

## 2021-10-19 DIAGNOSIS — Z8673 Personal history of transient ischemic attack (TIA), and cerebral infarction without residual deficits: Secondary | ICD-10-CM | POA: Diagnosis not present

## 2021-10-19 DIAGNOSIS — Z825 Family history of asthma and other chronic lower respiratory diseases: Secondary | ICD-10-CM

## 2021-10-19 DIAGNOSIS — D869 Sarcoidosis, unspecified: Secondary | ICD-10-CM | POA: Diagnosis not present

## 2021-10-19 DIAGNOSIS — R000 Tachycardia, unspecified: Secondary | ICD-10-CM | POA: Diagnosis not present

## 2021-10-19 DIAGNOSIS — Z79899 Other long term (current) drug therapy: Secondary | ICD-10-CM

## 2021-10-19 DIAGNOSIS — N183 Chronic kidney disease, stage 3 unspecified: Secondary | ICD-10-CM | POA: Diagnosis present

## 2021-10-19 DIAGNOSIS — R0600 Dyspnea, unspecified: Secondary | ICD-10-CM | POA: Diagnosis not present

## 2021-10-19 DIAGNOSIS — J8410 Pulmonary fibrosis, unspecified: Secondary | ICD-10-CM | POA: Diagnosis not present

## 2021-10-19 DIAGNOSIS — R918 Other nonspecific abnormal finding of lung field: Secondary | ICD-10-CM | POA: Diagnosis not present

## 2021-10-19 DIAGNOSIS — N179 Acute kidney failure, unspecified: Secondary | ICD-10-CM | POA: Diagnosis not present

## 2021-10-19 DIAGNOSIS — J189 Pneumonia, unspecified organism: Secondary | ICD-10-CM | POA: Diagnosis not present

## 2021-10-19 DIAGNOSIS — K449 Diaphragmatic hernia without obstruction or gangrene: Secondary | ICD-10-CM | POA: Diagnosis not present

## 2021-10-19 DIAGNOSIS — Z888 Allergy status to other drugs, medicaments and biological substances status: Secondary | ICD-10-CM

## 2021-10-19 DIAGNOSIS — J1282 Pneumonia due to coronavirus disease 2019: Secondary | ICD-10-CM | POA: Diagnosis not present

## 2021-10-19 DIAGNOSIS — Z882 Allergy status to sulfonamides status: Secondary | ICD-10-CM

## 2021-10-19 DIAGNOSIS — R0902 Hypoxemia: Secondary | ICD-10-CM | POA: Diagnosis not present

## 2021-10-19 HISTORY — DX: Acute respiratory failure, unspecified whether with hypoxia or hypercapnia: J96.00

## 2021-10-19 LAB — CBC
HCT: 37.2 % (ref 36.0–46.0)
Hemoglobin: 11.6 g/dL — ABNORMAL LOW (ref 12.0–15.0)
MCH: 30.2 pg (ref 26.0–34.0)
MCHC: 31.2 g/dL (ref 30.0–36.0)
MCV: 96.9 fL (ref 80.0–100.0)
Platelets: 385 10*3/uL (ref 150–400)
RBC: 3.84 MIL/uL — ABNORMAL LOW (ref 3.87–5.11)
RDW: 14.1 % (ref 11.5–15.5)
WBC: 5 10*3/uL (ref 4.0–10.5)
nRBC: 0 % (ref 0.0–0.2)

## 2021-10-19 LAB — BASIC METABOLIC PANEL
Anion gap: 11 (ref 5–15)
BUN: 29 mg/dL — ABNORMAL HIGH (ref 8–23)
CO2: 29 mmol/L (ref 22–32)
Calcium: 8.9 mg/dL (ref 8.9–10.3)
Chloride: 99 mmol/L (ref 98–111)
Creatinine, Ser: 1.1 mg/dL — ABNORMAL HIGH (ref 0.44–1.00)
GFR, Estimated: 55 mL/min — ABNORMAL LOW (ref >60)
Glucose, Bld: 257 mg/dL — ABNORMAL HIGH (ref 70–99)
Potassium: 4 mmol/L (ref 3.5–5.1)
Sodium: 139 mmol/L (ref 135–145)

## 2021-10-19 LAB — TROPONIN I (HIGH SENSITIVITY): Troponin I (High Sensitivity): 9 ng/L (ref <18)

## 2021-10-19 LAB — BLOOD GAS, VENOUS
Acid-Base Excess: 3.8 mmol/L — ABNORMAL HIGH (ref 0.0–2.0)
Bicarbonate: 31.7 mmol/L — ABNORMAL HIGH (ref 20.0–28.0)
O2 Saturation: 33.6 %
Patient temperature: 37
pCO2, Ven: 63 mmHg — ABNORMAL HIGH (ref 44.0–60.0)
pH, Ven: 7.31 (ref 7.250–7.430)
pO2, Ven: <31 mmHg — CL (ref 32.0–45.0)

## 2021-10-19 LAB — RESP PANEL BY RT-PCR (FLU A&B, COVID) ARPGX2
Influenza A by PCR: NEGATIVE
Influenza B by PCR: NEGATIVE
SARS Coronavirus 2 by RT PCR: POSITIVE — AB

## 2021-10-19 MED ORDER — PREDNISONE 20 MG PO TABS: 40.0000 mg | ORAL_TABLET | Freq: Every day | ORAL | Status: DC

## 2021-10-19 MED ORDER — METHYLPREDNISOLONE SODIUM SUCC 40 MG IJ SOLR
40.0000 mg | Freq: Two times a day (BID) | INTRAMUSCULAR | Status: AC
  Administered 2021-10-20: 40 mg via INTRAVENOUS
  Filled 2021-10-19: qty 1

## 2021-10-19 MED ORDER — SODIUM CHLORIDE 0.9 % IV SOLN: INTRAVENOUS | Status: DC

## 2021-10-19 MED ORDER — IOHEXOL 350 MG/ML SOLN
75.0000 mL | Freq: Once | INTRAVENOUS | Status: AC | PRN
  Administered 2021-10-19: 75 mL via INTRAVENOUS

## 2021-10-19 MED ORDER — FOLIC ACID 1 MG PO TABS
1.0000 mg | ORAL_TABLET | Freq: Every day | ORAL | Status: DC
  Administered 2021-10-20 – 2021-10-26 (×7): 1 mg via ORAL
  Filled 2021-10-19 (×7): qty 1

## 2021-10-19 MED ORDER — ACETAMINOPHEN 325 MG PO TABS
650.0000 mg | ORAL_TABLET | Freq: Four times a day (QID) | ORAL | Status: DC | PRN
  Administered 2021-10-21 – 2021-10-24 (×4): 650 mg via ORAL
  Filled 2021-10-19 (×4): qty 2

## 2021-10-19 MED ORDER — IPRATROPIUM-ALBUTEROL 0.5-2.5 (3) MG/3ML IN SOLN
3.0000 mL | Freq: Once | RESPIRATORY_TRACT | Status: AC
  Administered 2021-10-19: 3 mL via RESPIRATORY_TRACT
  Filled 2021-10-19: qty 3

## 2021-10-19 MED ORDER — LEVOTHYROXINE SODIUM 137 MCG PO TABS
137.0000 ug | ORAL_TABLET | Freq: Every day | ORAL | Status: DC
  Administered 2021-10-22 – 2021-10-26 (×4): 137 ug via ORAL
  Filled 2021-10-19 (×7): qty 1

## 2021-10-19 MED ORDER — ACETAMINOPHEN 650 MG RE SUPP: 650.0000 mg | Freq: Four times a day (QID) | RECTAL | Status: DC | PRN

## 2021-10-19 MED ORDER — ADULT MULTIVITAMIN W/MINERALS CH
1.0000 | ORAL_TABLET | Freq: Every day | ORAL | Status: DC
  Administered 2021-10-20 – 2021-10-26 (×7): 1 via ORAL
  Filled 2021-10-19 (×7): qty 1

## 2021-10-19 MED ORDER — ONDANSETRON HCL 4 MG/2ML IJ SOLN
4.0000 mg | Freq: Four times a day (QID) | INTRAMUSCULAR | Status: DC | PRN
  Administered 2021-10-25: 4 mg via INTRAVENOUS
  Filled 2021-10-19: qty 2

## 2021-10-19 MED ORDER — ALBUTEROL SULFATE (2.5 MG/3ML) 0.083% IN NEBU
2.5000 mg | INHALATION_SOLUTION | Freq: Four times a day (QID) | RESPIRATORY_TRACT | Status: DC | PRN
  Administered 2021-10-20: 2.5 mg via RESPIRATORY_TRACT
  Filled 2021-10-19: qty 3

## 2021-10-19 MED ORDER — TRAZODONE HCL 50 MG PO TABS: 25.0000 mg | ORAL_TABLET | Freq: Every evening | ORAL | Status: DC | PRN

## 2021-10-19 MED ORDER — LATANOPROST 0.005 % OP SOLN
1.0000 [drp] | Freq: Two times a day (BID) | OPHTHALMIC | Status: DC
  Administered 2021-10-20 – 2021-10-26 (×13): 1 [drp] via OPHTHALMIC
  Filled 2021-10-19 (×2): qty 2.5

## 2021-10-19 MED ORDER — ESCITALOPRAM OXALATE 20 MG PO TABS
20.0000 mg | ORAL_TABLET | Freq: Every day | ORAL | Status: DC
  Administered 2021-10-20 – 2021-10-26 (×7): 20 mg via ORAL
  Filled 2021-10-19: qty 2
  Filled 2021-10-19: qty 1
  Filled 2021-10-19 (×3): qty 2
  Filled 2021-10-19 (×3): qty 1

## 2021-10-19 MED ORDER — DORZOLAMIDE HCL 2 % OP SOLN
1.0000 [drp] | Freq: Two times a day (BID) | OPHTHALMIC | Status: DC
  Administered 2021-10-20 – 2021-10-24 (×8): 1 [drp] via OPHTHALMIC
  Filled 2021-10-19 (×2): qty 10

## 2021-10-19 MED ORDER — ENOXAPARIN SODIUM 40 MG/0.4ML IJ SOSY
40.0000 mg | PREFILLED_SYRINGE | Freq: Every day | INTRAMUSCULAR | Status: DC
Start: 2021-10-20 — End: 2021-10-26
  Administered 2021-10-20 – 2021-10-25 (×7): 40 mg via SUBCUTANEOUS
  Filled 2021-10-19 (×7): qty 0.4

## 2021-10-19 MED ORDER — HYDROCODONE BIT-HOMATROP MBR 5-1.5 MG/5ML PO SOLN
5.0000 mL | Freq: Four times a day (QID) | ORAL | Status: DC | PRN
  Filled 2021-10-19: qty 5

## 2021-10-19 MED ORDER — CYCLOBENZAPRINE HCL 10 MG PO TABS: 5.0000 mg | ORAL_TABLET | Freq: Three times a day (TID) | ORAL | Status: DC | PRN

## 2021-10-19 MED ORDER — METHOTREXATE 2.5 MG PO TABS: 25.0000 mg | ORAL_TABLET | ORAL | Status: DC

## 2021-10-19 MED ORDER — MUSCLE RUB 10-15 % EX CREA
1.0000 "application " | TOPICAL_CREAM | Freq: Every day | CUTANEOUS | Status: DC | PRN
  Filled 2021-10-19: qty 85

## 2021-10-19 MED ORDER — POTASSIUM CHLORIDE CRYS ER 20 MEQ PO TBCR
20.0000 meq | EXTENDED_RELEASE_TABLET | Freq: Two times a day (BID) | ORAL | Status: DC
  Administered 2021-10-20 – 2021-10-23 (×7): 20 meq via ORAL
  Filled 2021-10-19 (×7): qty 1

## 2021-10-19 MED ORDER — ONDANSETRON HCL 4 MG PO TABS
4.0000 mg | ORAL_TABLET | Freq: Four times a day (QID) | ORAL | Status: DC | PRN
  Administered 2021-10-26: 4 mg via ORAL
  Filled 2021-10-19: qty 1

## 2021-10-19 MED ORDER — SODIUM CHLORIDE 0.9 % IV SOLN
1.0000 g | Freq: Every day | INTRAVENOUS | Status: AC
  Administered 2021-10-20 – 2021-10-23 (×5): 1 g via INTRAVENOUS
  Filled 2021-10-19 (×5): qty 10

## 2021-10-19 MED ORDER — TREPROSTINIL 0.6 MG/ML IN SOLN
72.0000 ug | Freq: Four times a day (QID) | RESPIRATORY_TRACT | Status: DC
  Administered 2021-10-21 – 2021-10-26 (×15): 72 ug via RESPIRATORY_TRACT
  Filled 2021-10-19: qty 2.9

## 2021-10-19 MED ORDER — RISAQUAD PO CAPS
1.0000 | ORAL_CAPSULE | Freq: Every day | ORAL | Status: DC
  Administered 2021-10-21 – 2021-10-26 (×6): 1 via ORAL
  Filled 2021-10-19 (×6): qty 1

## 2021-10-19 MED ORDER — MAGNESIUM HYDROXIDE 400 MG/5ML PO SUSP: 30.0000 mL | Freq: Every day | ORAL | Status: DC | PRN

## 2021-10-19 NOTE — ED Triage Notes (Addendum)
Pt presents to ER via ems from home c/o sob that has become persistently worse today.  Pt states she had COVID 2 weeks ago and has been feeling bad for 2 weeks now but today became worse.  Ems sats on arrival were 75% on pts 2L home O2.  Pt appears to be using accessory muscles while breathing but states she is feeling better. Pt given 125 mg solu-medrol, 2g mag, 2 duo-neb, 2 albuterol and 0.3 mg epi IM.  18G LAC established.  Pt has hx pulmonary sarcoidosis.

## 2021-10-19 NOTE — Progress Notes (Signed)
Pt transported to CT on the Bipap and returned to the ED without incident. Pt remains on the Bipap and is tol well at this time.

## 2021-10-19 NOTE — H&P (Signed)
[place]   PATIENT NAME: Stacie [name]    MR#:  [id number]  DATE OF BIRTH:  05-Aug-1956  DATE OF ADMISSION:  10/19/2021  PRIMARY CARE PHYSICIAN: Glean Hess, MD   Patient is coming from: Home  REQUESTING/REFERRING PHYSICIAN: Merlyn Lot, MD CHIEF COMPLAINT:   Chief Complaint  Patient presents with   Respiratory Distress    HISTORY OF PRESENT ILLNESS:  Stacie [name] is a 66 y.o. Caucasian female with medical history significant for GERD, hypothyroidism, sarcoidosis, sleep apnea, CVA, asthma, depression and urolithiasis, who presented to the ER with acute onset of worsening dyspnea with associated cough productive of yellow sputum and wheezing which have been worsening over the last week with associated chills without measured fever.  She denied any nausea or vomiting.  No chest pain or palpitations.  She denied any headache or dizziness or blurred vision.  She has been having mild abdominal discomfort with cough.  No dysuria, oliguria or hematuria or flank pain.  The patient had 2 COVID vaccinations and a booster.  ED Course: When she came to the ER heart rate was 120 with a respiratory rate of 25 and pulse symmetry was 97% on 6 L of O2 by nasal cannula.  She later required BiPAP at 40% FiO2 with a pulse extremity of 9900%.  Labs revealed VBG with pH 7.31 and PCO2 of 63 and HCO3 of 31.7 and BMP showed blood glucose of 257 with a BUN of 29 and creatinine 1.1.  CBC showed hemoglobin of 11.6 and hematocrit 37.2.  COVID-19 PCR came back positive and influenza antigens came back negative. EKG as reviewed by me : EKG showed sinus tachycardia with a rate of 125 with left atrial enlargement. Imaging: Portable chest ray showed stable extensive chronic findings consistent with sarcoidosis and interval development of mild mild right lung scarring and/or atelectasis. Chest CTA revealed, 1. No evidence of pulmonary embolism. 2. Stable findings consistent with end-stage  sarcoid. 3. 4 mm and 8 mm ill-defined subsolid left lower lobe lung nodules. Non-contrast chest CT at 3-6 months is recommended. If nodules persist and are stable at that time, consider additional non-contrast chest CT examinations at 2 and 4 years. This recommendation follows the consensus statement: Guidelines for Management of Incidental Pulmonary Nodules Detected on CT Images: From the Fleischner Society 2017; Radiology 2017; 284:228-243. 4. Small hiatal hernia. 5. Evidence of prior vertebroplasty throughout multiple lower thoracic and upper lumbar spine vertebra. 6. Aortic atherosclerosis.  The patient was given subcu epi by EMS as well as IV magnesium sulfate, 125 mg of IV Solu-Medrol and 2 DuoNeb's in the ER.  She will be admitted to a progressive unit bed for further evaluation and management. PAST MEDICAL HISTORY:   Past Medical History:  Diagnosis Date   Acute respiratory failure with hypoxia ([place])    Arrhythmia    patient unaware if this is current   Asthma    Chronic kidney disease    Depression    GERD (gastroesophageal reflux disease)    Heart murmur    History of kidney stones    HOH (hard of hearing)    wear aids   Hyperthyroidism    Hypothyroidism    IBS (irritable bowel syndrome)    Pneumonia    Pulmonary hypertension ([place])    Sarcoid    Sarcoidosis    Seasonal allergies    Sleep apnea CPAP with O2   Stroke ([place]) 07/2020   [place]   Wears hearing  aid in both ears     PAST SURGICAL HISTORY:   Past Surgical History:  Procedure Laterality Date   ABDOMINAL HYSTERECTOMY     partial   AMPUTATION Left 01/14/2021   Procedure: AMPUTATION RAY (1ST TOE ) ( 2ND METATARSAL HEAD RESECTION);  Surgeon: Algernon Huxley, MD;  Location: [facility];  Service: Vascular;  Laterality: Left;   CARDIAC CATHETERIZATION  10/18/2018   [facility]   CATARACT EXTRACTION W/PHACO Left 07/31/2019   Procedure: CATARACT EXTRACTION PHACO AND INTRAOCULAR LENS PLACEMENT (IOC) LEFT 00:51.1   17.9%  9.15;  Surgeon: Leandrew Koyanagi, MD;  Location: [place];  Service: Ophthalmology;  Laterality: Left;  keep this patient second   COLON SURGERY     "colon was fused to bladder - operated on both"   COLONOSCOPY  09/18/2007   diverticuli, no polyps   COLONOSCOPY  05/26/2010   diverticuli, no polyps   CYSTOSCOPY WITH STENT PLACEMENT Bilateral 07/14/2020   Procedure: CYSTOSCOPY WITH STENT PLACEMENT, RETROPYLOGRAM;  Surgeon: Billey Co, MD;  Location: [facility];  Service: Urology;  Laterality: Bilateral;   CYSTOSCOPY/URETEROSCOPY/HOLMIUM LASER/STENT PLACEMENT Left 02/20/2020   Procedure: CYSTOSCOPY/URETEROSCOPY/LITHOTRIPSY /STENT PLACEMENT;  Surgeon: Hollice Espy, MD;  Location: [facility];  Service: Urology;  Laterality: Left;   CYSTOSCOPY/URETEROSCOPY/HOLMIUM LASER/STENT PLACEMENT Bilateral 10/02/2020   Procedure: CYSTOSCOPY/URETEROSCOPY/HOLMIUM LASER/STENT PLACEMENT;  Surgeon: Billey Co, MD;  Location: [facility];  Service: Urology;  Laterality: Bilateral;   EYE SURGERY     KYPHOPLASTY N/A 05/10/2021   Procedure: KYPHOPLASTY, L2, L3, L4;  Surgeon: Hessie Knows, MD;  Location: [facility];  Service: Orthopedics;  Laterality: N/A;   KYPHOPLASTY N/A 06/25/2021   Procedure: T10, T12, L1 KYPHOPLASTY;  Surgeon: Hessie Knows, MD;  Location: [facility];  Service: Orthopedics;  Laterality: N/A;   PARS PLANA VITRECTOMY Right 05/20/2015   Procedure: PARS PLANA VITRECTOMY WITH 25 GAUGE, laser;  Surgeon: Milus Height, MD;  Location: [facility];  Service: Ophthalmology;  Laterality: Right;   PARTIAL HYSTERECTOMY  1990   TEE WITHOUT CARDIOVERSION N/A 07/27/2020   Procedure: TRANSESOPHAGEAL ECHOCARDIOGRAM (TEE);  Surgeon: Teodoro Spray, MD;  Location: [facility];  Service: Cardiovascular;  Laterality: N/A;   TUBAL LIGATION     WISDOM TOOTH EXTRACTION      SOCIAL HISTORY:   Social History   Tobacco Use   Smoking status: Never   Smokeless tobacco: Never  Substance Use Topics    Alcohol use: No    FAMILY HISTORY:   Family History  Problem Relation Age of Onset   Allergies Father    Asthma Father    Colon cancer Father    Allergies Brother    Asthma Brother    Breast cancer Maternal Grandmother     DRUG ALLERGIES:   Allergies  Allergen Reactions   Corn-Containing Products     Upset stomach    Nitrofurantoin Nausea Only   Plaquenil [Hydroxychloroquine]     Unknown reaction    Tramadol Nausea And Vomiting   Sulfa Antibiotics Rash    As an infant    REVIEW OF SYSTEMS:   ROS As per history of present illness. All pertinent systems were reviewed above. Constitutional, HEENT, cardiovascular, respiratory, GI, GU, musculoskeletal, neuro, psychiatric, endocrine, integumentary and hematologic systems were reviewed and are otherwise negative/unremarkable except for positive findings mentioned above in the HPI.   MEDICATIONS AT HOME:   Prior to Admission medications   Medication Sig Start Date End Date Taking? Authorizing Provider  acetaminophen (TYLENOL) 500 MG tablet Take 500  mg by mouth every 6 (six) hours as needed for moderate pain or headache.   Yes [provider]  albuterol (PROVENTIL) (2.5 MG/3ML) 0.083% nebulizer solution Inhale 2.5 mg into the lungs every 6 (six) hours as needed for wheezing or shortness of breath. 01/04/21 01/04/22 Yes [provider]  budesonide-formoterol (SYMBICORT) 80-4.5 MCG/ACT inhaler Take 2 puffs first thing in am and then another 2 puffs about 12 hours later. Patient taking differently: Inhale 2 puffs into the lungs in the morning and at bedtime. 08/13/20  Yes Angiulli, Lavon Paganini, PA-C  cefdinir (OMNICEF) 300 MG capsule Take 1 capsule (300 mg total) by mouth 2 (two) times daily for 10 days. 10/13/21 10/23/21 Yes Glean Hess, MD  dorzolamide (TRUSOPT) 2 % ophthalmic solution Place 1 drop into both eyes 2 (two) times daily.   Yes [provider]  escitalopram (LEXAPRO) 20 MG tablet TAKE 1 TABLET  BY MOUTH EVERY DAY 08/18/21  Yes Glean Hess, MD  folic acid (FOLVITE) 1 MG tablet Take 1 tablet (1 mg total) by mouth daily. 08/13/20  Yes Angiulli, Lavon Paganini, PA-C  HYDROcodone bit-homatropine (HYCODAN) 5-1.5 MG/5ML syrup Take 5 mLs by mouth every 6 (six) hours as needed for up to 10 days for cough. 10/13/21 10/23/21 Yes Glean Hess, MD  latanoprost (XALATAN) 0.005 % ophthalmic solution Place 1 drop into the right eye 2 (two) times daily.   Yes [provider]  levothyroxine (SYNTHROID) 137 MCG tablet TAKE 1 TABLET (137 MCG TOTAL) BY MOUTH DAILY BEFORE BREAKFAST. Patient taking differently: Take 137 mcg by mouth daily before breakfast. 12/08/20  Yes Glean Hess, MD  Menthol (ICY HOT) 5 % [facility] Apply 1 patch topically daily as needed (pain).   Yes [provider]  methotrexate (RHEUMATREX) 2.5 MG tablet Take 10 tablets (25 mg total) by mouth every Tuesday. 02/23/21  Yes Elgergawy, Silver Huguenin, MD  Multiple Vitamins-Minerals (MULTIVITAMIN WITH MINERALS) tablet Take 1 tablet by mouth daily.   Yes [provider]  naproxen sodium (ALEVE) 220 MG tablet Take 220 mg by mouth daily as needed (pain).   Yes [provider]  potassium citrate (UROCIT-K) 10 MEQ (1080 MG) SR tablet Take 20 mEq by mouth 2 (two) times daily. 05/31/21  Yes [provider]  predniSONE (DELTASONE) 10 MG tablet Take 19 mg by mouth daily.   Yes [provider]  Probiotic Product (PROBIOTIC PO) Take 1 capsule by mouth daily.   Yes [provider]  Treprostinil (TYVASO) 0.6 MG/ML SOLN Inhale into the lungs See admin instructions. 12 breaths 4 times a day   Yes [provider]  cyclobenzaprine (FLEXERIL) 5 MG tablet Take 5-10 mg by mouth 3 (three) times daily as needed. 07/09/21   [provider]  denosumab (PROLIA) 60 MG/ML SOSY injection Inject 60 mg into the skin every 6 (six) months.    [provider]  HYDROcodone-acetaminophen (NORCO)  5-325 MG tablet Take 1 tablet by mouth every 6 (six) hours as needed for moderate pain. Patient not taking: Reported on 10/19/2021 06/25/21   Hessie Knows, MD  NON FORMULARY CPAP nightly    [provider]  OXYGEN Inhale 2 L into the lungs daily as needed (During walking and activity).     [provider]      VITAL SIGNS:  Blood pressure (!) 108/55, pulse (!) 120, temperature 98.7 F (37.1 C), temperature source Oral, resp. rate (!) 21, SpO2 100 %.  PHYSICAL EXAMINATION:  Physical Exam  GENERAL: Acutely ill 66 y.o.-year-old Caucasian female patient lying in the bed with mild respiratory distress with conversational dyspnea on BiPAP. EYES: Pupils equal, round, reactive to light and accommodation. No scleral icterus. Extraocular muscles intact.  HEENT: Head atraumatic, normocephalic. Oropharynx and nasopharynx clear.  NECK:  Supple, no jugular venous distention. No thyroid enlargement, no tenderness.  LUNGS: Diminished bibasilar breath sounds with diminished expiratory airflow and harsh vesicular breathing with residual expiratory wheezes.   CARDIOVASCULAR: Regular rate and rhythm, S1, S2 normal. No murmurs, rubs, or gallops.  ABDOMEN: Soft, nondistended, nontender. Bowel sounds present. No organomegaly or mass.  EXTREMITIES: No pedal edema, cyanosis, or clubbing.  NEUROLOGIC: Cranial nerves II through XII are intact. Muscle strength 5/5 in all extremities. Sensation intact. Gait not checked.  PSYCHIATRIC: The patient is alert and oriented x 3.  Normal affect and good eye contact. SKIN: No obvious rash, lesion, or ulcer.   LABORATORY PANEL:   CBC Recent Labs  Lab 10/19/21 2024  WBC 5.0  HGB 11.6*  HCT 37.2  PLT 385   ------------------------------------------------------------------------------------------------------------------  Chemistries  Recent Labs  Lab 10/19/21 2024  NA 139  K 4.0  CL 99  CO2 29  GLUCOSE 257*  BUN 29*  CREATININE 1.10*  CALCIUM  8.9   ------------------------------------------------------------------------------------------------------------------  Cardiac Enzymes No results for input(s): TROPONINI in the last 168 hours. ------------------------------------------------------------------------------------------------------------------  RADIOLOGY:  CT Angio Chest PE W and/or Wo Contrast  Result Date: 10/19/2021 CLINICAL DATA:  Recent history of COVID with worsening shortness of breath. EXAM: CT ANGIOGRAPHY CHEST WITH CONTRAST TECHNIQUE: Multidetector CT imaging of the chest was performed using the standard protocol during bolus administration of intravenous contrast. Multiplanar CT image reconstructions and MIPs were obtained to evaluate the vascular anatomy. CONTRAST:  35m OMNIPAQUE IOHEXOL 350 MG/ML SOLN COMPARISON:  February 03, 2021 FINDINGS: Cardiovascular: There is moderate severity calcification of the aortic arch, without evidence of aortic aneurysm or dissection. Satisfactory opacification of the pulmonary arteries to the segmental level. No evidence of pulmonary embolism. Normal heart size. No pericardial effusion. Mediastinum/Nodes: No enlarged mediastinal, hilar, or axillary lymph nodes. Thyroid gland, trachea, and esophagus demonstrate no significant findings. Lungs/Pleura: Stable, marked severity, coarse interstitial thickening and architectural distortion is seen involving predominantly the bilateral upper lobes, right middle lobe and superior aspect of the left lower lobe. Stable areas of bronchiectasis and perivascular nodularity are also noted within these areas. Stable areas of scarring and/or atelectasis are seen throughout the lower lung fields, bilaterally with stable subpleural cysts seen along the periphery of the mid and lower lung fields. 4 mm and 8 mm ill-defined subsolid lung nodules are seen within the posterior aspect of the left lower lobe (axial CT images 50 through 63, CT series 6). These are not  clearly identified on the prior study. There is no evidence of a pleural effusion or pneumothorax. Upper Abdomen: There is a small hiatal hernia. Musculoskeletal: Evidence of prior vertebroplasty is seen throughout multiple lower thoracic and upper lumbar spine vertebra. Review of the MIP images confirms the above findings. IMPRESSION: 1. No evidence of pulmonary embolism. 2. Stable findings consistent with end-stage sarcoid. 3. 4 mm and 8 mm ill-defined subsolid left lower lobe lung nodules. Non-contrast chest CT at 3-6 months is recommended. If nodules persist and are stable at that time, consider additional non-contrast chest CT examinations at 2 and 4 years. This recommendation follows the consensus statement: Guidelines for Management of Incidental Pulmonary Nodules Detected on CT Images: From the Fleischner Society 2017; Radiology  2017; 678:938-101. 4. Small hiatal hernia. 5. Evidence of prior vertebroplasty throughout multiple lower thoracic and upper lumbar spine vertebra. 6. Aortic atherosclerosis. Aortic Atherosclerosis (ICD10-I70.0). Electronically Signed   By: Virgina Norfolk M.D.   On: 10/19/2021 23:06   DG Chest Port 1 View  Result Date: 10/19/2021 CLINICAL DATA:  History of COVID positive with shortness of breath. EXAM: PORTABLE CHEST 1 VIEW COMPARISON:  May 06, 2021 FINDINGS: Diffusely increased interstitial lung markings and extensive bilateral lung opacities are seen. This is stable in appearance when compared to the prior study. Mild areas of linear scarring and/or atelectasis are seen within the mid right lung which represents a new finding. No pneumothorax is identified. The heart size and mediastinal contours are within normal limits. Evidence of prior vertebroplasty is seen at the levels of T8, T9, T10 and T11. IMPRESSION: 1. Stable, extensive chronic findings consistent with sarcoidosis. 2. Interval development of mild mid right lung scarring and/or atelectasis. Electronically Signed    By: Virgina Norfolk M.D.   On: 10/19/2021 20:46      IMPRESSION AND PLAN:  Principal Problem:   ARF (acute respiratory failure) ([place])  1.  Acute hypoxic respiratory failure requiring BiPAP likely secondary to end-stage sarcoidosis with a flare of asthma exacerbation as well as COVID-19 infection. - The patient will be admitted to a progressive unit bed. - We will continue steroid therapy with IV Solu-Medrol. - We will continue bronchodilator therapy with DuoNebs. - We will place the patient on IV remdesivir. - Vitamin C vitamin D3 and zinc sulfate will be provided as well as aspirin. - We will utilize BiPAP as tolerated and taper off ad lib. - Mucolytic therapy will be provided. - We will follow up on sputum cultures. - We will hold off Symbicort.  2.  Rheumatoid arthritis. - We will continue methotrexate.  3.  Hypothyroidism. - We will continue Synthroid.  4.  Depression. - We will continue Lexapro  DVT prophylaxis: Lovenox. Code Status: full code. Family Communication:  The plan of care was discussed in details with the patient (and family). I answered all questions. The patient agreed to proceed with the above mentioned plan. Further management will depend upon hospital course. Disposition Plan: Back to previous home environment Consults called: none. All the records are reviewed and case discussed with ED provider.  Status is: Inpatient    At the time of the admission, it appears that the appropriate admission status for this patient is inpatient.  This is judged to be reasonable and necessary in order to provide the required intensity of service to ensure the patient's safety given the presenting symptoms, physical exam findings and initial radiographic and laboratory data in the context of comorbid conditions.  The patient requires inpatient status due to high intensity of service, high risk of further deterioration and high frequency of surveillance required.  I  certify that at the time of admission, it is my clinical judgment that the patient will require inpatient hospital care extending more than 2 midnights.                            Dispo: The patient is from: Home              Anticipated d/c is to: Home              Patient currently is not medically stable to d/c.  Difficult to place patient: No  Authorized and performed by: Eugenie Norrie, MD Total critical care time: Approximately   55    minutes. Due to a high probability of clinically significant, life-threatening deterioration, the patient required my highest level of preparedness to intervene emergently and I personally spent this critical care time directly and personally managing the patient.  This critical care time included obtaining a history, examining the patient, pulse oximetry, ordering and review of studies, arranging urgent treatment with development of management plan, evaluation of patient's response to treatment, frequent reassessment, and discussions with other providers. This critical care time was performed to assess and manage the high probability of imminent, life-threatening deterioration that could result in multiorgan failure.  It was exclusive of separately billable procedures and treating other patients and teaching time.      Christel Mormon M.D on 10/19/2021 at 11:45 PM  [facility]   From 7 PM-7 AM, contact night-coverage [email or web address]  CC: Primary care physician; Glean Hess, MD

## 2021-10-19 NOTE — ED Provider Notes (Addendum)
[facility] Provider Note    Event Date/Time   First MD Initiated Contact with Patient 10/19/21 2026     (approximate)   History   Respiratory Distress   HPI  Stacie [name] is a 66 y.o. female with a history of sarcoidosis on chronic O2 as well as chronic steroids presents to the ER for worsening shortness of breath.  On review of outpatient clinic visit is just had visit on the 28th for chief complaint of fever and cough.  She did test positive for COVID on 12/13 and completed a course of Paxil Oved but never felt better and actually started feeling worse around the 28th.  She was placed on Omnicef as well as prednisone.  EMS arrived and found the patient to be 70% on her home oxygen.  Was given nebs as well as mag, IV Solu-Medrol and epinephrine.  She feels that her symptoms are improved but still feels quite dyspneic.     Physical Exam   Triage Vital Signs: ED Triage Vitals  Enc Vitals Group     BP 10/19/21 2018 123/70     Pulse Rate 10/19/21 2018 (!) 120     Resp 10/19/21 2018 (!) 25     Temp 10/19/21 2018 98.7 F (37.1 C)     Temp Source 10/19/21 2018 Oral     SpO2 10/19/21 2018 97 %     Weight --      Height --      Head Circumference --      Peak Flow --      Pain Score 10/19/21 2020 7     Pain Loc --      Pain Edu? --      Excl. in [place]? --     Most recent vital signs: Vitals:   10/19/21 2200 10/19/21 2230  BP: 119/76 (!) 108/55  Pulse: (!) 123 (!) 120  Resp: (!) 25 (!) 21  Temp:    SpO2: 99% 100%     Constitutional: Alert in mod resp distress Eyes: Conjunctivae are normal.  Head: Atraumatic. Nose: No congestion/rhinnorhea. Mouth/Throat: Mucous membranes are moist.   Neck: Painless ROM.  Cardiovascular:   Good peripheral circulation.tachycardic Respiratory: Patient very tachypneic with use of accessory muscles with prolonged expiratory phase.  Diffuse wheezing throughout..  Gastrointestinal: Soft and nontender.   Musculoskeletal:  no deformity Neurologic:  MAE spontaneously. No gross focal neurologic deficits are appreciated.  Skin:  Skin is warm, dry and intact. No rash noted. Psychiatric: Mood and affect are normal. Speech and behavior are normal.    ED Results / Procedures / Treatments   Labs (all labs ordered are listed, but only abnormal results are displayed) Labs Reviewed  RESP PANEL BY RT-PCR (FLU A&B, COVID) ARPGX2 - Abnormal; Notable for the following components:      Result Value   SARS Coronavirus 2 by RT PCR POSITIVE (*)    All other components within normal limits  BASIC METABOLIC PANEL - Abnormal; Notable for the following components:   Glucose, Bld 257 (*)    BUN 29 (*)    Creatinine, Ser 1.10 (*)    GFR, Estimated 55 (*)    All other components within normal limits  CBC - Abnormal; Notable for the following components:   RBC 3.84 (*)    Hemoglobin 11.6 (*)    All other components within normal limits  BLOOD GAS, VENOUS - Abnormal; Notable for the following components:   pCO2, Ven 63 (*)  pO2, Ven <31.0 (*)    Bicarbonate 31.7 (*)    Acid-Base Excess 3.8 (*)    All other components within normal limits  PROCALCITONIN  TROPONIN I (HIGH SENSITIVITY)  TROPONIN I (HIGH SENSITIVITY)     EKG  ED ECG REPORT I, Merlyn Lot, the attending physician, personally viewed and interpreted this ECG.   Date: 10/19/2021  EKG Time: 20:13  Rate: 125  Rhythm: sinus tachycardia  Axis: normal  Intervals:normal intervals  ST&T Change: nosnspecific st abn, likely rate dependent    RADIOLOGY I personally reviewed all radiographic images ordered to evaluate for the above acute complaints and reviewed radiology reports and findings.  These findings were personally discussed with the patient.  Please see medical record for radiology report.    PROCEDURES:  Critical Care performed: Yes, see critical care procedure note(s)  .Critical Care Performed by: Merlyn Lot, MD Authorized by: Merlyn Lot, MD   Critical care provider statement:    Critical care time (minutes):  35   Critical care was necessary to treat or prevent imminent or life-threatening deterioration of the following conditions:  Respiratory failure   Critical care was time spent personally by me on the following activities:  Ordering and performing treatments and interventions, ordering and review of laboratory studies, ordering and review of radiographic studies, pulse oximetry, re-evaluation of patient's condition, review of old charts, obtaining history from patient or surrogate, examination of patient, evaluation of patient's response to treatment, discussions with primary provider, discussions with consultants and development of treatment plan with patient or surrogate .1-3 Lead EKG Interpretation Performed by: Merlyn Lot, MD Authorized by: Merlyn Lot, MD     Interpretation: abnormal     ECG rate:  120   ECG rate assessment: tachycardic     Rhythm: sinus tachycardia     MEDICATIONS ORDERED IN ED: Medications  Treprostinil (TYVASO) inhalation solution 72 mcg (has no administration in time range)  ipratropium-albuterol (DUONEB) 0.5-2.5 (3) MG/3ML nebulizer solution 3 mL (3 mLs Nebulization Given 10/19/21 2038)  ipratropium-albuterol (DUONEB) 0.5-2.5 (3) MG/3ML nebulizer solution 3 mL (3 mLs Nebulization Given 10/19/21 2102)  iohexol (OMNIPAQUE) 350 MG/ML injection 75 mL (75 mLs Intravenous Contrast Given 10/19/21 2245)     IMPRESSION / MDM / ASSESSMENT AND PLAN / ED COURSE  I reviewed the triage vital signs and the nursing notes.                              Differential diagnosis includes, but is not limited to, Asthma, copd, CHF, pna, ptx, malignancy, Pe, anemia  Patient presents in acute respiratory distress with hypoxia very tachypneic but protecting her airway.  Patient given medication in route with EMS with some improvement but given her respiratory  failure was placed on BiPAP.  Does have wheezing on exam and presentation complicated by recent COVID illness and as she is on antibiotics for concern for worsening of her illness will order chest x-ray and CTA.     Clinical Course as of 10/19/21 2324  Tue Oct 19, 2021  2049 My review of patient's chest x-ray does have diffuse opacities likely secondary to sarcoid may appear slightly worse than previous.  Formal radiology report pending. [PR]  2103 Patient with significant improvement on BiPAP.  Given her recent COVID illness tachycardia significant hypoxia do feel that CTA indicated to evaluate for PE. [PR]  2301 T imaging on my review does not show any evidence of large  PE.  Will wait for radiology report.  She appears comfortable on BiPAP does appear stable for admission [PR]    Clinical Course User Index [PR] Merlyn Lot, MD   Patient presentation discussed in consultation with hospitalist who accepts patient to their service  FINAL CLINICAL IMPRESSION(S) / ED DIAGNOSES   Final diagnoses:  Acute respiratory failure with hypoxia ([place])     Rx / DC Orders   ED Discharge Orders     None        Note:  This document was prepared using Dragon voice recognition software and may include unintentional dictation errors.    Merlyn Lot, MD 10/19/21 Janus Molder    Merlyn Lot, MD 10/19/21 [phone]

## 2021-10-20 ENCOUNTER — Inpatient Hospital Stay: Payer: BC Managed Care – PPO

## 2021-10-20 DIAGNOSIS — J9601 Acute respiratory failure with hypoxia: Secondary | ICD-10-CM | POA: Diagnosis not present

## 2021-10-20 LAB — BASIC METABOLIC PANEL
Anion gap: 11 (ref 5–15)
BUN: 31 mg/dL — ABNORMAL HIGH (ref 8–23)
CO2: 29 mmol/L (ref 22–32)
Calcium: 8.2 mg/dL — ABNORMAL LOW (ref 8.9–10.3)
Chloride: 98 mmol/L (ref 98–111)
Creatinine, Ser: 1.05 mg/dL — ABNORMAL HIGH (ref 0.44–1.00)
GFR, Estimated: 59 mL/min — ABNORMAL LOW (ref >60)
Glucose, Bld: 199 mg/dL — ABNORMAL HIGH (ref 70–99)
Potassium: 4.3 mmol/L (ref 3.5–5.1)
Sodium: 138 mmol/L (ref 135–145)

## 2021-10-20 LAB — BRAIN NATRIURETIC PEPTIDE: B Natriuretic Peptide: 104.4 pg/mL — ABNORMAL HIGH (ref 0.0–100.0)

## 2021-10-20 LAB — CBC
HCT: 33 % — ABNORMAL LOW (ref 36.0–46.0)
Hemoglobin: 10.2 g/dL — ABNORMAL LOW (ref 12.0–15.0)
MCH: 29.7 pg (ref 26.0–34.0)
MCHC: 30.9 g/dL (ref 30.0–36.0)
MCV: 95.9 fL (ref 80.0–100.0)
Platelets: 356 10*3/uL (ref 150–400)
RBC: 3.44 MIL/uL — ABNORMAL LOW (ref 3.87–5.11)
RDW: 14.2 % (ref 11.5–15.5)
WBC: 2.3 10*3/uL — ABNORMAL LOW (ref 4.0–10.5)
nRBC: 0 % (ref 0.0–0.2)

## 2021-10-20 LAB — LACTATE DEHYDROGENASE: LDH: 164 U/L (ref 98–192)

## 2021-10-20 LAB — TROPONIN I (HIGH SENSITIVITY)
Troponin I (High Sensitivity): 9 ng/L (ref <18)
Troponin I (High Sensitivity): 10 ng/L (ref <18)

## 2021-10-20 LAB — C-REACTIVE PROTEIN: CRP: 29.5 mg/dL — ABNORMAL HIGH (ref <1.0)

## 2021-10-20 LAB — FERRITIN: Ferritin: 559 ng/mL — ABNORMAL HIGH (ref 11–307)

## 2021-10-20 LAB — HIV ANTIBODY (ROUTINE TESTING W REFLEX): HIV Screen 4th Generation wRfx: NONREACTIVE

## 2021-10-20 LAB — PROCALCITONIN: Procalcitonin: 0.6 ng/mL

## 2021-10-20 LAB — D-DIMER, QUANTITATIVE: D-Dimer, Quant: 3.18 ug/mL-FEU — ABNORMAL HIGH (ref 0.00–0.50)

## 2021-10-20 LAB — FIBRINOGEN: Fibrinogen: >800 mg/dL — ABNORMAL HIGH (ref 210–475)

## 2021-10-20 MED ORDER — SODIUM CHLORIDE 0.9 % IV SOLN
200.0000 mg | Freq: Once | INTRAVENOUS | Status: DC
  Filled 2021-10-20: qty 40

## 2021-10-20 MED ORDER — HYDROCOD POLST-CPM POLST ER 10-8 MG/5ML PO SUER
5.0000 mL | Freq: Two times a day (BID) | ORAL | Status: DC | PRN
  Administered 2021-10-20: 5 mL via ORAL

## 2021-10-20 MED ORDER — GUAIFENESIN ER 600 MG PO TB12
600.0000 mg | ORAL_TABLET | Freq: Two times a day (BID) | ORAL | Status: DC
  Administered 2021-10-20 – 2021-10-22 (×3): 600 mg via ORAL
  Filled 2021-10-20 (×6): qty 1

## 2021-10-20 MED ORDER — PREDNISONE 50 MG PO TABS
50.0000 mg | ORAL_TABLET | Freq: Every day | ORAL | Status: DC
  Administered 2021-10-23 – 2021-10-24 (×2): 50 mg via ORAL
  Filled 2021-10-20 (×2): qty 1

## 2021-10-20 MED ORDER — ASCORBIC ACID 500 MG PO TABS
500.0000 mg | ORAL_TABLET | Freq: Every day | ORAL | Status: DC
  Administered 2021-10-20 – 2021-10-26 (×7): 500 mg via ORAL
  Filled 2021-10-20 (×7): qty 1

## 2021-10-20 MED ORDER — SODIUM CHLORIDE 0.9 % IV SOLN
100.0000 mg | Freq: Every day | INTRAVENOUS | Status: DC
  Filled 2021-10-20: qty 20

## 2021-10-20 MED ORDER — METOPROLOL TARTRATE 5 MG/5ML IV SOLN: 2.5000 mg | Freq: Three times a day (TID) | INTRAVENOUS | Status: DC | PRN

## 2021-10-20 MED ORDER — HYDROCOD POLST-CPM POLST ER 10-8 MG/5ML PO SUER
ORAL | Status: AC
  Filled 2021-10-20: qty 5

## 2021-10-20 MED ORDER — METHYLPREDNISOLONE SODIUM SUCC 125 MG IJ SOLR
80.0000 mg | Freq: Two times a day (BID) | INTRAMUSCULAR | Status: AC
  Administered 2021-10-20 – 2021-10-21 (×5): 80 mg via INTRAVENOUS
  Filled 2021-10-20 (×4): qty 2

## 2021-10-20 MED ORDER — METHYLPREDNISOLONE SODIUM SUCC 125 MG IJ SOLR
INTRAMUSCULAR | Status: AC
  Administered 2021-10-20: 80 mg via INTRAVENOUS
  Filled 2021-10-20: qty 2

## 2021-10-20 MED ORDER — ZINC SULFATE 220 (50 ZN) MG PO CAPS
220.0000 mg | ORAL_CAPSULE | Freq: Every day | ORAL | Status: DC
  Administered 2021-10-20 – 2021-10-26 (×7): 220 mg via ORAL
  Filled 2021-10-20 (×7): qty 1

## 2021-10-20 MED ORDER — FUROSEMIDE 10 MG/ML IJ SOLN
40.0000 mg | INTRAMUSCULAR | Status: AC
Start: 2021-10-20 — End: 2021-10-20
  Administered 2021-10-20: 40 mg via INTRAVENOUS
  Filled 2021-10-20: qty 4

## 2021-10-20 MED ORDER — VITAMIN D 25 MCG (1000 UNIT) PO TABS
1000.0000 [IU] | ORAL_TABLET | Freq: Every day | ORAL | Status: DC
  Administered 2021-10-20 – 2021-10-22 (×3): 1000 [IU] via ORAL
  Filled 2021-10-20 (×3): qty 1

## 2021-10-20 MED ORDER — GUAIFENESIN-DM 100-10 MG/5ML PO SYRP: 10.0000 mL | ORAL_SOLUTION | ORAL | Status: DC | PRN

## 2021-10-20 NOTE — Progress Notes (Signed)
Pt unable to do IS & Flutter valve due to being on BiPAP

## 2021-10-20 NOTE — Progress Notes (Addendum)
PROGRESS NOTE  Stacie [name] LKT:[id number] DOB: Jul 23, 1956 DOA: 10/19/2021 PCP: Glean Hess, MD  HPI/Recap of past 24 hours: Stacie [name] is a 66 y.o. Caucasian female with medical history significant for GERD, hypothyroidism, sarcoidosis, sleep apnea, CVA, asthma, depression and urolithiasis, who presented to [facility] ER with acute onset of worsening dyspnea with associated cough productive of yellow sputum and wheezing which have been worsening over the last week.  Also chills without measured fever.  No chest pain or palpitations.  Work-up in the ED revealed COVID-19 viral infection, acute hypoxic respiratory failure requiring BiPAP.  She was started on antiviral IV remdesivir, IV antibiotics empirically, bronchodilators and IV steroids.  10/20/2021: Patient was seen and examined at her bedside.  She reports a persistent productive cough.  She denies any chest pain.  Assessment/Plan: Principal Problem:   ARF (acute respiratory failure) (HCC)  Acute hypoxic respiratory failure, multifactorial, secondary to COVID-19 viral infection, asthma exacerbation.   Not on oxygen supplementation at baseline Personally reviewed chest x-ray done on admission which shows bilateral pulmonary infiltrates left greater than right. COVID-19 screening test positive on 10/19/2021.   She was started on antiviral IV remdesivir, IV antibiotics Rocephin empirically, bronchodilators and IV steroids, continue. Continue pulmonary toilet BiPAP nightly Wean off oxygen supplementation as tolerated Incentive spirometer, flutter valve Mobilize as tolerated Antitussives Follow-up sputum culture.  COVID-19 viral infection, POA Management as stated above.  Asthma exacerbation secondary to above. Continue IV steroids, bronchodilators  Left great toe infection, POA Present for the past months Obtain x-ray Podiatry consult Continue IV antibiotics Obtain MRSA screening test Obtain blood cultures x2  peripheral  Leukopenia with concern for sepsis secondary to left great toe infection. WBC 2.3, pulse 112, respiratory 21. Obtain blood cultures x2 peripherally 10/20/2021. Monitor fever curve and WBC  Rheumatoid arthritis. Hold off methotrexate for now Continue IV steroids  Hypothyroidism. - We will continue Synthroid.  Depression. - We will continue Lexapro    Critical care time: 65 minutes.      DVT prophylaxis: Lovenox subcu daily. Code Status: full code. Family Communication:  The plan of care was discussed in details with the patient.  No family members at bedside. I answered all questions. The patient agreed to proceed with the above mentioned plan. Further management will depend upon hospital course. Disposition Plan: Back to previous home environment Consults called: none. All the records are reviewed and case discussed with ED provider.   Status is: Inpatient  Patient requires at least 2 midnights for further evaluation and treatment of pain condition.      Objective: Vitals:   10/20/21 0430 10/20/21 0500 10/20/21 0630 10/20/21 0930  BP: (!) 131/93 (!) 155/82 (!) 155/79 (!) 164/80  Pulse: 99 95 86 100  Resp: (!) _0 (!) 27  Temp:      TempSrc:      SpO2: 98% 97% 99% 92%   No intake or output data in the 24 hours ending 10/20/21 1041 There were no vitals filed for this visit.  Exam:  General: 66 y.o. year-old female well developed well nourished in no acute distress.  Alert and oriented x3. Cardiovascular: Regular rate and rhythm with no rubs or gallops.  No thyromegaly or JVD noted.   Respiratory: Diffuse wheezing bilaterally.  Diffuse rales bilaterally.  Poor inspiratory effort. Abdomen: Soft nontender nondistended with normal bowel sounds x4 quadrants. Musculoskeletal: No lower extremity edema bilaterally.  Left great toe, purulent wound. Skin: No rashes noted. Psychiatry: Mood is  appropriate for condition and setting Neuro: Moves all 4  extremities equally.   Data Reviewed: CBC: Recent Labs  Lab 10/19/21 2024 10/20/21 0806  WBC 5.0 2.3*  HGB 11.6* 10.2*  HCT 37.2 33.0*  MCV 96.9 95.9  PLT 385 001   Basic Metabolic Panel: Recent Labs  Lab 10/19/21 2024 10/20/21 0806  NA 139 138  K 4.0 4.3  CL 99 98  CO2 29 29  GLUCOSE 257* 199*  BUN 29* 31*  CREATININE 1.10* 1.05*  CALCIUM 8.9 8.2*   GFR: Estimated Creatinine Clearance: 57 mL/min (A) (by C-G formula based on SCr of 1.05 mg/dL (H)). Liver Function Tests: No results for input(s): AST, ALT, ALKPHOS, BILITOT, PROT, ALBUMIN in the last 168 hours. No results for input(s): LIPASE, AMYLASE in the last 168 hours. No results for input(s): AMMONIA in the last 168 hours. Coagulation Profile: No results for input(s): INR, PROTIME in the last 168 hours. Cardiac Enzymes: No results for input(s): CKTOTAL, CKMB, CKMBINDEX, TROPONINI in the last 168 hours. BNP (last 3 results) No results for input(s): PROBNP in the last 8760 hours. HbA1C: No results for input(s): HGBA1C in the last 72 hours. CBG: No results for input(s): GLUCAP in the last 168 hours. Lipid Profile: No results for input(s): CHOL, HDL, LDLCALC, TRIG, CHOLHDL, LDLDIRECT in the last 72 hours. Thyroid Function Tests: No results for input(s): TSH, T4TOTAL, FREET4, T3FREE, THYROIDAB in the last 72 hours. Anemia Panel: Recent Labs    10/20/21 0806  FERRITIN 559*   Urine analysis:    Component Value Date/Time   COLORURINE STRAW (A) 05/06/2021 1616   APPEARANCEUR CLEAR (A) 05/06/2021 1616   APPEARANCEUR Clear 04/21/2021 1551   LABSPEC 1.010 05/06/2021 1616   PHURINE 8.0 05/06/2021 1616   GLUCOSEU NEGATIVE 05/06/2021 1616   HGBUR SMALL (A) 05/06/2021 1616   BILIRUBINUR NEGATIVE 05/06/2021 1616   BILIRUBINUR Negative 04/21/2021 1551   KETONESUR NEGATIVE 05/06/2021 1616   PROTEINUR NEGATIVE 05/06/2021 1616   UROBILINOGEN 0.2 01/07/2020 1406   UROBILINOGEN 0.2 11/25/2014 1849   NITRITE NEGATIVE  05/06/2021 1616   LEUKOCYTESUR NEGATIVE 05/06/2021 1616   Sepsis Labs: _0 (procalcitonin:4,lacticidven:4)  ) Recent Results (from the past 240 hour(s))  Resp Panel by RT-PCR (Flu A&B, Covid) Nasopharyngeal Swab     Status: Abnormal   Collection Time: 10/19/21  8:24 PM   Specimen: Nasopharyngeal Swab; Nasopharyngeal(NP) swabs in vial transport medium  Result Value Ref Range Status   SARS Coronavirus 2 by RT PCR POSITIVE (A) NEGATIVE Final    Comment: (NOTE) SARS-CoV-2 target nucleic acids are DETECTED.  The SARS-CoV-2 RNA is generally detectable in upper respiratory specimens during the acute phase of infection. Positive results are indicative of the presence of the identified virus, but do not rule out bacterial infection or co-infection with other pathogens not detected by the test. Clinical correlation with patient history and other diagnostic information is necessary to determine patient infection status. The expected result is Negative.  Fact Sheet for Patients: EntrepreneurPulse.com.au  Fact Sheet for Healthcare Providers: IncredibleEmployment.be  This test is not yet approved or cleared by the [place] FDA and  has been authorized for detection and/or diagnosis of SARS-CoV-2 by FDA under an Emergency Use Authorization (EUA).  This EUA will remain in effect (meaning this test can be used) for the duration of  the COVID-19 declaration under Section 564(b)(1) of the A ct, 21 U.S.C. section 360bbb-3(b)(1), unless the authorization is terminated or revoked sooner.     Influenza A by PCR  NEGATIVE NEGATIVE Final   Influenza B by PCR NEGATIVE NEGATIVE Final    Comment: (NOTE) The Xpert Xpress SARS-CoV-2/FLU/RSV plus assay is intended as an aid in the diagnosis of influenza from Nasopharyngeal swab specimens and should not be used as a sole basis for treatment. Nasal washings and aspirates are unacceptable for Xpert Xpress  SARS-CoV-2/FLU/RSV testing.  Fact Sheet for Patients: EntrepreneurPulse.com.au  Fact Sheet for Healthcare Providers: IncredibleEmployment.be  This test is not yet approved or cleared by the [place] FDA and has been authorized for detection and/or diagnosis of SARS-CoV-2 by FDA under an Emergency Use Authorization (EUA). This EUA will remain in effect (meaning this test can be used) for the duration of the COVID-19 declaration under Section 564(b)(1) of the Act, 21 U.S.C. section 360bbb-3(b)(1), unless the authorization is terminated or revoked.  Performed at [facility], [address] [place], [place] [address]       Studies: CT Angio Chest PE W and/or Wo Contrast  Result Date: 10/19/2021 CLINICAL DATA:  Recent history of COVID with worsening shortness of breath. EXAM: CT ANGIOGRAPHY CHEST WITH CONTRAST TECHNIQUE: Multidetector CT imaging of the chest was performed using the standard protocol during bolus administration of intravenous contrast. Multiplanar CT image reconstructions and MIPs were obtained to evaluate the vascular anatomy. CONTRAST:  32m OMNIPAQUE IOHEXOL 350 MG/ML SOLN COMPARISON:  February 03, 2021 FINDINGS: Cardiovascular: There is moderate severity calcification of the aortic arch, without evidence of aortic aneurysm or dissection. Satisfactory opacification of the pulmonary arteries to the segmental level. No evidence of pulmonary embolism. Normal heart size. No pericardial effusion. Mediastinum/Nodes: No enlarged mediastinal, hilar, or axillary lymph nodes. Thyroid gland, trachea, and esophagus demonstrate no significant findings. Lungs/Pleura: Stable, marked severity, coarse interstitial thickening and architectural distortion is seen involving predominantly the bilateral upper lobes, right middle lobe and superior aspect of the left lower lobe. Stable areas of bronchiectasis and perivascular nodularity are also noted  within these areas. Stable areas of scarring and/or atelectasis are seen throughout the lower lung fields, bilaterally with stable subpleural cysts seen along the periphery of the mid and lower lung fields. 4 mm and 8 mm ill-defined subsolid lung nodules are seen within the posterior aspect of the left lower lobe (axial CT images 50 through 63, CT series 6). These are not clearly identified on the prior study. There is no evidence of a pleural effusion or pneumothorax. Upper Abdomen: There is a small hiatal hernia. Musculoskeletal: Evidence of prior vertebroplasty is seen throughout multiple lower thoracic and upper lumbar spine vertebra. Review of the MIP images confirms the above findings. IMPRESSION: 1. No evidence of pulmonary embolism. 2. Stable findings consistent with end-stage sarcoid. 3. 4 mm and 8 mm ill-defined subsolid left lower lobe lung nodules. Non-contrast chest CT at 3-6 months is recommended. If nodules persist and are stable at that time, consider additional non-contrast chest CT examinations at 2 and 4 years. This recommendation follows the consensus statement: Guidelines for Management of Incidental Pulmonary Nodules Detected on CT Images: From the Fleischner Society 2017; Radiology 2017; 284:228-243. 4. Small hiatal hernia. 5. Evidence of prior vertebroplasty throughout multiple lower thoracic and upper lumbar spine vertebra. 6. Aortic atherosclerosis. Aortic Atherosclerosis (ICD10-I70.0). Electronically Signed   By: TVirgina NorfolkM.D.   On: 10/19/2021 23:06   DG Chest Port 1 View  Result Date: 10/19/2021 CLINICAL DATA:  History of COVID positive with shortness of breath. EXAM: PORTABLE CHEST 1 VIEW COMPARISON:  May 06, 2021 FINDINGS: Diffusely increased interstitial  lung markings and extensive bilateral lung opacities are seen. This is stable in appearance when compared to the prior study. Mild areas of linear scarring and/or atelectasis are seen within the mid right lung which  represents a new finding. No pneumothorax is identified. The heart size and mediastinal contours are within normal limits. Evidence of prior vertebroplasty is seen at the levels of T8, T9, T10 and T11. IMPRESSION: 1. Stable, extensive chronic findings consistent with sarcoidosis. 2. Interval development of mild mid right lung scarring and/or atelectasis. Electronically Signed   By: Virgina Norfolk M.D.   On: 10/19/2021 20:46    Scheduled Meds:  [START ON 10/21/2021] acidophilus  1 capsule Oral Daily   vitamin C  500 mg Oral Daily   cholecalciferol  1,000 Units Oral Daily   dorzolamide  1 drop Both Eyes BID   enoxaparin (LOVENOX) injection  40 mg Subcutaneous QHS   escitalopram  20 mg Oral Daily   folic acid  1 mg Oral Daily   guaiFENesin  600 mg Oral BID   latanoprost  1 drop Right Eye BID   levothyroxine  137 mcg Oral QAC breakfast   [START ON 10/26/2021] methotrexate  25 mg Oral Q Tue   methylPREDNISolone (SOLU-MEDROL) injection  80 mg Intravenous Q12H   Followed by   Derrill Memo ON 10/23/2021] predniSONE  50 mg Oral Daily   methylPREDNISolone (SOLU-MEDROL) injection  40 mg Intravenous Q12H   multivitamin with minerals  1 tablet Oral Daily   potassium chloride SA  20 mEq Oral BID   Treprostinil  72 mcg Inhalation QID   zinc sulfate  220 mg Oral Daily    Continuous Infusions:  sodium chloride 100 mL/hr at 10/20/21 0132   cefTRIAXone (ROCEPHIN)  IV Stopped (10/20/21 0343)   remdesivir 200 mg in sodium chloride 0.9% 250 mL IVPB     Followed by   Derrill Memo ON 10/21/2021] remdesivir 100 mg in NS 100 mL       LOS: 1 day     Kayleen Memos, MD [facility] Pager [phone]  If 7PM-7AM, please contact night-coverage [email or web address] Password [facility] 10/20/2021, 10:41 AM

## 2021-10-20 NOTE — ED Notes (Signed)
Lab contacted again about culture draw. Confirmed that they were sending someone.

## 2021-10-20 NOTE — ED Notes (Signed)
Pt titrated to 4 L [place]. Husband at bedside reports pt wears 2 L [place] chronic and SpO2 states around 90-92%.  Call bell in reach. Denies needs

## 2021-10-20 NOTE — ED Notes (Signed)
Pt provided breakfast tray.

## 2021-10-20 NOTE — Consult Note (Addendum)
Reason for Consult: Chronic wound left foot status post ray resections with concerns for osteomyelitis. Referring Physician: Fransico Michael is an 66 y.o. female.  HPI: This is a 66 year old female with complicated medical history with gangrenous changes earlier this year in the left foot requiring amputation of multiple digits.  Her husband has been performing wound care and presents pictures on his phone of sequential healing and stabilization of the wounds.  Has been applying Telfa and a bandage at this point at home.  Presented to the emergency department today with some increased respiratory distress and podiatry consulted for evaluation of the wound on her foot.  Past Medical History:  Diagnosis Date   Acute respiratory failure with hypoxia ([place])    Arrhythmia    patient unaware if this is current   Asthma    Chronic kidney disease    Depression    GERD (gastroesophageal reflux disease)    Heart murmur    History of kidney stones    HOH (hard of hearing)    wear aids   Hyperthyroidism    Hypothyroidism    IBS (irritable bowel syndrome)    Pneumonia    Pulmonary hypertension ([place])    Sarcoid    Sarcoidosis    Seasonal allergies    Sleep apnea CPAP with O2   Stroke ([place]) 07/2020   [place]   Wears hearing aid in both ears     Past Surgical History:  Procedure Laterality Date   ABDOMINAL HYSTERECTOMY     partial   AMPUTATION Left 01/14/2021   Procedure: AMPUTATION RAY (1ST TOE ) ( 2ND METATARSAL HEAD RESECTION);  Surgeon: Algernon Huxley, MD;  Location: [facility];  Service: Vascular;  Laterality: Left;   CARDIAC CATHETERIZATION  10/18/2018   [facility]   CATARACT EXTRACTION W/PHACO Left 07/31/2019   Procedure: CATARACT EXTRACTION PHACO AND INTRAOCULAR LENS PLACEMENT (IOC) LEFT 00:51.1  17.9%  9.15;  Surgeon: Leandrew Koyanagi, MD;  Location: [facility];  Service: Ophthalmology;  Laterality: Left;  keep this patient second   COLON SURGERY     "colon was fused  to bladder - operated on both"   COLONOSCOPY  09/18/2007   diverticuli, no polyps   COLONOSCOPY  05/26/2010   diverticuli, no polyps   CYSTOSCOPY WITH STENT PLACEMENT Bilateral 07/14/2020   Procedure: CYSTOSCOPY WITH STENT PLACEMENT, RETROPYLOGRAM;  Surgeon: Billey Co, MD;  Location: [facility];  Service: Urology;  Laterality: Bilateral;   CYSTOSCOPY/URETEROSCOPY/HOLMIUM LASER/STENT PLACEMENT Left 02/20/2020   Procedure: CYSTOSCOPY/URETEROSCOPY/LITHOTRIPSY /STENT PLACEMENT;  Surgeon: Hollice Espy, MD;  Location: [facility];  Service: Urology;  Laterality: Left;   CYSTOSCOPY/URETEROSCOPY/HOLMIUM LASER/STENT PLACEMENT Bilateral 10/02/2020   Procedure: CYSTOSCOPY/URETEROSCOPY/HOLMIUM LASER/STENT PLACEMENT;  Surgeon: Billey Co, MD;  Location: [facility];  Service: Urology;  Laterality: Bilateral;   EYE SURGERY     KYPHOPLASTY N/A 05/10/2021   Procedure: KYPHOPLASTY, L2, L3, L4;  Surgeon: Hessie Knows, MD;  Location: [facility];  Service: Orthopedics;  Laterality: N/A;   KYPHOPLASTY N/A 06/25/2021   Procedure: T10, T12, L1 KYPHOPLASTY;  Surgeon: Hessie Knows, MD;  Location: [facility];  Service: Orthopedics;  Laterality: N/A;   PARS PLANA VITRECTOMY Right 05/20/2015   Procedure: PARS PLANA VITRECTOMY WITH 25 GAUGE, laser;  Surgeon: Milus Height, MD;  Location: [facility];  Service: Ophthalmology;  Laterality: Right;   PARTIAL HYSTERECTOMY  1990   TEE WITHOUT CARDIOVERSION N/A 07/27/2020   Procedure: TRANSESOPHAGEAL ECHOCARDIOGRAM (TEE);  Surgeon: Teodoro Spray, MD;  Location: [facility];  Service: Cardiovascular;  Laterality: N/A;   TUBAL LIGATION     WISDOM TOOTH EXTRACTION      Family History  Problem Relation Age of Onset   Allergies Father    Asthma Father    Colon cancer Father    Allergies Brother    Asthma Brother    Breast cancer Maternal Grandmother     Social History:  reports that she has never smoked. She has never used smokeless tobacco. She reports that she does not  drink alcohol and does not use drugs.  Allergies:  Allergies  Allergen Reactions   Corn-Containing Products     Upset stomach    Nitrofurantoin Nausea Only   Plaquenil [Hydroxychloroquine]     Unknown reaction    Tramadol Nausea And Vomiting   Sulfa Antibiotics Rash    As an infant    Medications: Scheduled:  [START ON 10/21/2021] acidophilus  1 capsule Oral Daily   vitamin C  500 mg Oral Daily   cholecalciferol  1,000 Units Oral Daily   dorzolamide  1 drop Both Eyes BID   enoxaparin (LOVENOX) injection  40 mg Subcutaneous QHS   escitalopram  20 mg Oral Daily   folic acid  1 mg Oral Daily   guaiFENesin  600 mg Oral BID   latanoprost  1 drop Right Eye BID   levothyroxine  137 mcg Oral QAC breakfast   methylPREDNISolone (SOLU-MEDROL) injection  80 mg Intravenous Q12H   Followed by   Derrill Memo ON 10/23/2021] predniSONE  50 mg Oral Daily   methylPREDNISolone (SOLU-MEDROL) injection  40 mg Intravenous Q12H   multivitamin with minerals  1 tablet Oral Daily   potassium chloride SA  20 mEq Oral BID   Treprostinil  72 mcg Inhalation QID   zinc sulfate  220 mg Oral Daily    Results for orders placed or performed during the hospital encounter of 10/19/21 (from the past 48 hour(s))  Basic metabolic panel     Status: Abnormal   Collection Time: 10/19/21  8:24 PM  Result Value Ref Range   Sodium 139 135 - 145 mmol/L   Potassium 4.0 3.5 - 5.1 mmol/L   Chloride 99 98 - 111 mmol/L   CO2 29 22 - 32 mmol/L   Glucose, Bld 257 (H) 70 - 99 mg/dL    Comment: Glucose reference range applies only to samples taken after fasting for at least 8 hours.   BUN 29 (H) 8 - 23 mg/dL   Creatinine, Ser 1.10 (H) 0.44 - 1.00 mg/dL   Calcium 8.9 8.9 - 10.3 mg/dL   GFR, Estimated 55 (L) >60 mL/min    Comment: (NOTE) Calculated using the CKD-EPI Creatinine Equation (2021)    Anion gap 11 5 - 15    Comment: Performed at [facility], [address] [place], [place] [address]  CBC     Status:  Abnormal   Collection Time: 10/19/21  8:24 PM  Result Value Ref Range   WBC 5.0 4.0 - 10.5 K/uL   RBC 3.84 (L) 3.87 - 5.11 MIL/uL   Hemoglobin 11.6 (L) 12.0 - 15.0 g/dL   HCT 37.2 36.0 - 46.0 %   MCV 96.9 80.0 - 100.0 fL   MCH 30.2 26.0 - 34.0 pg   MCHC 31.2 30.0 - 36.0 g/dL   RDW 14.1 11.5 - 15.5 %   Platelets 385 150 - 400 K/uL   nRBC 0.0 0.0 - 0.2 %    Comment: Performed at [facility],  [place], [place] [address]  Troponin I (High Sensitivity)     Status: None   Collection Time: 10/19/21  8:24 PM  Result Value Ref Range   Troponin I (High Sensitivity) 9 <18 ng/L    Comment: (NOTE) Elevated high sensitivity troponin I (hsTnI) values and significant  changes across serial measurements may suggest ACS but many other  chronic and acute conditions are known to elevate hsTnI results.  Refer to the "Links" section for chest pain algorithms and additional  guidance. Performed at [facility], [address] [place], [place] [address]   Resp Panel by RT-PCR (Flu A&B, Covid) Nasopharyngeal Swab     Status: Abnormal   Collection Time: 10/19/21  8:24 PM   Specimen: Nasopharyngeal Swab; Nasopharyngeal(NP) swabs in vial transport medium  Result Value Ref Range   SARS Coronavirus 2 by RT PCR POSITIVE (A) NEGATIVE    Comment: (NOTE) SARS-CoV-2 target nucleic acids are DETECTED.  The SARS-CoV-2 RNA is generally detectable in upper respiratory specimens during the acute phase of infection. Positive results are indicative of the presence of the identified virus, but do not rule out bacterial infection or co-infection with other pathogens not detected by the test. Clinical correlation with patient history and other diagnostic information is necessary to determine patient infection status. The expected result is Negative.  Fact Sheet for Patients: EntrepreneurPulse.com.au  Fact Sheet for Healthcare  Providers: IncredibleEmployment.be  This test is not yet approved or cleared by the [place] FDA and  has been authorized for detection and/or diagnosis of SARS-CoV-2 by FDA under an Emergency Use Authorization (EUA).  This EUA will remain in effect (meaning this test can be used) for the duration of  the COVID-19 declaration under Section 564(b)(1) of the A ct, 21 U.S.C. section 360bbb-3(b)(1), unless the authorization is terminated or revoked sooner.     Influenza A by PCR NEGATIVE NEGATIVE   Influenza B by PCR NEGATIVE NEGATIVE    Comment: (NOTE) The Xpert Xpress SARS-CoV-2/FLU/RSV plus assay is intended as an aid in the diagnosis of influenza from Nasopharyngeal swab specimens and should not be used as a sole basis for treatment. Nasal washings and aspirates are unacceptable for Xpert Xpress SARS-CoV-2/FLU/RSV testing.  Fact Sheet for Patients: EntrepreneurPulse.com.au  Fact Sheet for Healthcare Providers: IncredibleEmployment.be  This test is not yet approved or cleared by the [place] FDA and has been authorized for detection and/or diagnosis of SARS-CoV-2 by FDA under an Emergency Use Authorization (EUA). This EUA will remain in effect (meaning this test can be used) for the duration of the COVID-19 declaration under Section 564(b)(1) of the Act, 21 U.S.C. section 360bbb-3(b)(1), unless the authorization is terminated or revoked.  Performed at [facility], [address] [place], [place] [address]   Blood gas, venous     Status: Abnormal   Collection Time: 10/19/21  8:32 PM  Result Value Ref Range   pH, Ven 7.31 7.250 - 7.430   pCO2, Ven 63 (H) 44.0 - 60.0 mmHg   pO2, Ven <31.0 (LL) 32.0 - 45.0 mmHg   Bicarbonate 31.7 (H) 20.0 - 28.0 mmol/L   Acid-Base Excess 3.8 (H) 0.0 - 2.0 mmol/L   O2 Saturation 33.6 %   Patient temperature 37.0    Collection site VEIN    Sample type VENOUS      Comment: Performed at [facility], [address], [place], [place] [address]  Troponin I (High Sensitivity)     Status: None  Collection Time: 10/20/21  1:25 AM  Result Value Ref Range   Troponin I (High Sensitivity) 9 <18 ng/L    Comment: (NOTE) Elevated high sensitivity troponin I (hsTnI) values and significant  changes across serial measurements may suggest ACS but many other  chronic and acute conditions are known to elevate hsTnI results.  Refer to the "Links" section for chest pain algorithms and additional  guidance. Performed at [facility], [address] [place], [place] [address]   Procalcitonin - Baseline     Status: None   Collection Time: 10/20/21  1:25 AM  Result Value Ref Range   Procalcitonin 0.60 ng/mL    Comment:        Interpretation: PCT > 0.5 ng/mL and <= 2 ng/mL: Systemic infection (sepsis) is possible, but other conditions are known to elevate PCT as well. (NOTE)       Sepsis PCT Algorithm           Lower Respiratory Tract                                      Infection PCT Algorithm    ----------------------------     ----------------------------         PCT < 0.25 ng/mL                PCT < 0.10 ng/mL          Strongly encourage             Strongly discourage   discontinuation of antibiotics    initiation of antibiotics    ----------------------------     -----------------------------       PCT 0.25 - 0.50 ng/mL            PCT 0.10 - 0.25 ng/mL               OR       >80% decrease in PCT            Discourage initiation of                                            antibiotics      Encourage discontinuation           of antibiotics    ----------------------------     -----------------------------         PCT >= 0.50 ng/mL              PCT 0.26 - 0.50 ng/mL                AND       <80% decrease in PCT             Encourage initiation of                                             antibiotics       Encourage continuation            of antibiotics    ----------------------------     -----------------------------        PCT >= 0.50 ng/mL  PCT > 0.50 ng/mL               AND         increase in PCT                  Strongly encourage                                      initiation of antibiotics    Strongly encourage escalation           of antibiotics                                     -----------------------------                                           PCT <= 0.25 ng/mL                                                 OR                                        > 80% decrease in PCT                                      Discontinue / Do not initiate                                             antibiotics  Performed at [facility], [address] [place], [place] [address]   HIV Antibody (routine testing w rflx)     Status: None   Collection Time: 10/20/21  1:25 AM  Result Value Ref Range   HIV Screen 4th Generation wRfx Non Reactive Non Reactive    Comment: Performed at [facility], [place] [address], [place], [place] [address]  Basic metabolic panel     Status: Abnormal   Collection Time: 10/20/21  8:06 AM  Result Value Ref Range   Sodium 138 135 - 145 mmol/L   Potassium 4.3 3.5 - 5.1 mmol/L   Chloride 98 98 - 111 mmol/L   CO2 29 22 - 32 mmol/L   Glucose, Bld 199 (H) 70 - 99 mg/dL    Comment: Glucose reference range applies only to samples taken after fasting for at least 8 hours.   BUN 31 (H) 8 - 23 mg/dL   Creatinine, Ser 1.05 (H) 0.44 - 1.00 mg/dL   Calcium 8.2 (L) 8.9 - 10.3 mg/dL   GFR, Estimated 59 (L) >60 mL/min    Comment: (NOTE) Calculated using the CKD-EPI Creatinine Equation (2021)    Anion gap 11 5 - 15    Comment: Performed at [facility], [address] [place], [place] [address]  CBC  Status: Abnormal   Collection Time: 10/20/21  8:06 AM  Result Value Ref Range   WBC 2.3 (L) 4.0 - 10.5 K/uL   RBC 3.44 (L) 3.87 - 5.11 MIL/uL    Hemoglobin 10.2 (L) 12.0 - 15.0 g/dL   HCT 33.0 (L) 36.0 - 46.0 %   MCV 95.9 80.0 - 100.0 fL   MCH 29.7 26.0 - 34.0 pg   MCHC 30.9 30.0 - 36.0 g/dL   RDW 14.2 11.5 - 15.5 %   Platelets 356 150 - 400 K/uL   nRBC 0.0 0.0 - 0.2 %    Comment: Performed at [facility], [address] [place], [place] [address]  C-reactive protein     Status: Abnormal   Collection Time: 10/20/21  8:06 AM  Result Value Ref Range   CRP 29.5 (H) <1.0 mg/dL    Comment: Performed at [facility] [address], [place], [place] [address]  Ferritin     Status: Abnormal   Collection Time: 10/20/21  8:06 AM  Result Value Ref Range   Ferritin 559 (H) 11 - 307 ng/mL    Comment: Performed at [facility], [address] [place], [place] [address]  Fibrinogen     Status: Abnormal   Collection Time: 10/20/21  8:06 AM  Result Value Ref Range   Fibrinogen >800 (H) 210 - 475 mg/dL    Comment: Performed at [facility], [address] [place], [place] [address]  Lactate dehydrogenase     Status: None   Collection Time: 10/20/21  8:06 AM  Result Value Ref Range   LDH 164 98 - 192 U/L    Comment: Performed at [facility], [address] [place], [place] [address]  Brain natriuretic peptide     Status: Abnormal   Collection Time: 10/20/21  8:06 AM  Result Value Ref Range   B Natriuretic Peptide 104.4 (H) 0.0 - 100.0 pg/mL    Comment: Performed at [facility], [address] [place], [place] [address]  D-dimer, quantitative     Status: Abnormal   Collection Time: 10/20/21  8:06 AM  Result Value Ref Range   D-Dimer, Quant 3.18 (H) 0.00 - 0.50 ug/mL-FEU    Comment: (NOTE) At the manufacturer cut-off value of 0.5 g/mL FEU, this assay has a negative predictive value of 95-100%.This assay is intended for use in conjunction with a clinical pretest probability (PTP) assessment model to exclude pulmonary embolism (PE) and deep venous thrombosis (DVT) in  outpatients suspected of PE or DVT. Results should be correlated with clinical presentation. Performed at [facility], [place], [place] [address]   Troponin I (High Sensitivity)     Status: None   Collection Time: 10/20/21  8:06 AM  Result Value Ref Range   Troponin I (High Sensitivity) 10 <18 ng/L    Comment: (NOTE) Elevated high sensitivity troponin I (hsTnI) values and significant  changes across serial measurements may suggest ACS but many other  chronic and acute conditions are known to elevate hsTnI results.  Refer to the "Links" section for chest pain algorithms and additional  guidance. Performed at [facility], [address] [place], [place] [address]     CT Angio Chest PE W and/or Wo Contrast  Result Date: 10/19/2021 CLINICAL DATA:  Recent history of COVID with worsening shortness of breath. EXAM: CT ANGIOGRAPHY CHEST WITH CONTRAST TECHNIQUE: Multidetector CT imaging of the chest was performed using the standard protocol  during bolus administration of intravenous contrast. Multiplanar CT image reconstructions and MIPs were obtained to evaluate the vascular anatomy. CONTRAST:  72m OMNIPAQUE IOHEXOL 350 MG/ML SOLN COMPARISON:  February 03, 2021 FINDINGS: Cardiovascular: There is moderate severity calcification of the aortic arch, without evidence of aortic aneurysm or dissection. Satisfactory opacification of the pulmonary arteries to the segmental level. No evidence of pulmonary embolism. Normal heart size. No pericardial effusion. Mediastinum/Nodes: No enlarged mediastinal, hilar, or axillary lymph nodes. Thyroid gland, trachea, and esophagus demonstrate no significant findings. Lungs/Pleura: Stable, marked severity, coarse interstitial thickening and architectural distortion is seen involving predominantly the bilateral upper lobes, right middle lobe and superior aspect of the left lower lobe. Stable areas of bronchiectasis and perivascular  nodularity are also noted within these areas. Stable areas of scarring and/or atelectasis are seen throughout the lower lung fields, bilaterally with stable subpleural cysts seen along the periphery of the mid and lower lung fields. 4 mm and 8 mm ill-defined subsolid lung nodules are seen within the posterior aspect of the left lower lobe (axial CT images 50 through 63, CT series 6). These are not clearly identified on the prior study. There is no evidence of a pleural effusion or pneumothorax. Upper Abdomen: There is a small hiatal hernia. Musculoskeletal: Evidence of prior vertebroplasty is seen throughout multiple lower thoracic and upper lumbar spine vertebra. Review of the MIP images confirms the above findings. IMPRESSION: 1. No evidence of pulmonary embolism. 2. Stable findings consistent with end-stage sarcoid. 3. 4 mm and 8 mm ill-defined subsolid left lower lobe lung nodules. Non-contrast chest CT at 3-6 months is recommended. If nodules persist and are stable at that time, consider additional non-contrast chest CT examinations at 2 and 4 years. This recommendation follows the consensus statement: Guidelines for Management of Incidental Pulmonary Nodules Detected on CT Images: From the Fleischner Society 2017; Radiology 2017; 284:228-243. 4. Small hiatal hernia. 5. Evidence of prior vertebroplasty throughout multiple lower thoracic and upper lumbar spine vertebra. 6. Aortic atherosclerosis. Aortic Atherosclerosis (ICD10-I70.0). Electronically Signed   By: TVirgina NorfolkM.D.   On: 10/19/2021 23:06   DG Chest Port 1 View  Result Date: 10/19/2021 CLINICAL DATA:  History of COVID positive with shortness of breath. EXAM: PORTABLE CHEST 1 VIEW COMPARISON:  May 06, 2021 FINDINGS: Diffusely increased interstitial lung markings and extensive bilateral lung opacities are seen. This is stable in appearance when compared to the prior study. Mild areas of linear scarring and/or atelectasis are seen within the  mid right lung which represents a new finding. No pneumothorax is identified. The heart size and mediastinal contours are within normal limits. Evidence of prior vertebroplasty is seen at the levels of T8, T9, T10 and T11. IMPRESSION: 1. Stable, extensive chronic findings consistent with sarcoidosis. 2. Interval development of mild mid right lung scarring and/or atelectasis. Electronically Signed   By: TVirgina NorfolkM.D.   On: 10/19/2021 20:46   DG Foot Complete Left  Result Date: 10/20/2021 CLINICAL DATA:  Nonhealing wound at the base of the first metatarsal EXAM: LEFT FOOT - COMPLETE 3+ VIEW COMPARISON:  MRI examination dated May 08, 2021 FINDINGS: Postsurgical changes for prior amputation through the first metatarsal head, second metatarsal neck and third metatarsophalangeal joint. There are osseous erosions at the distal aspect of the first and second metatarsal surgical site with prominent subcutaneous soft tissue edema. Findings are suspicious for osteomyelitis, further evaluation with MR examination is recommended. IMPRESSION: Findings suspicious for osteomyelitis of the first and second metatarsals, further  evaluation with MR examination would be helpful if clinically warranted. Electronically Signed   By: Keane Police D.O.   On: 10/20/2021 11:44    Review of Systems  Unable to perform ROS: Acuity of condition  Blood pressure (!) 134/102, pulse 91, temperature 98.7 F (37.1 C), temperature source Oral, resp. rate (!) 21, SpO2 97 %. Physical Exam Cardiovascular:     Comments: DP and PT pulses are diminished but palpable bilateral. Musculoskeletal:     Comments: Previous amputation of the first, second, and third toes on the left foot.  Skin:    Comments: The skin is warm dry and supple.  Eschared full-thickness ulceration is noted at the first and second toe amputation sites.  No signs of surrounding cellulitis or significant drainage.  No clinical signs of infection.  Neurological:      Comments: Patient appears to be sensate in the lower extremities.    Assessment/Plan: Assessment: Full-thickness ulceration left forefoot status post first and second partial ray resections.  Stable with no signs of infection.  Plan: Discussed with the family at bedside.  Her husband is a physician here at the hospital and has been performing her wound care at home very adequately.  Reviewed x-rays taken as well as previous images from July and there does not appear to be any significant interval change or signs of osteomyelitis at this point.  Recommend local wound care and her husband and family can resume once she is discharged from the hospital.  Podiatry will sign off for now.  Durward Fortes 10/20/2021, 7:12 PM

## 2021-10-20 NOTE — ED Notes (Signed)
Peri care provided, linens and brief changed from urine incontinence. Purewick placed. Daughter at bedside. Denies needs

## 2021-10-20 NOTE — Progress Notes (Signed)
Pt placed on HHFNC 40L 33% per RN request.

## 2021-10-20 NOTE — Progress Notes (Signed)
Pt placed back on BiPAP due to Resp Distress from coughing after Tyvaso

## 2021-10-20 NOTE — ED Notes (Signed)
Lab contacted for blood culture draw. Confirmed that they would send someone.

## 2021-10-21 DIAGNOSIS — J9601 Acute respiratory failure with hypoxia: Secondary | ICD-10-CM | POA: Diagnosis not present

## 2021-10-21 LAB — COMPREHENSIVE METABOLIC PANEL
ALT: 49 U/L — ABNORMAL HIGH (ref 0–44)
AST: 28 U/L (ref 15–41)
Albumin: 2.5 g/dL — ABNORMAL LOW (ref 3.5–5.0)
Alkaline Phosphatase: 71 U/L (ref 38–126)
Anion gap: 10 (ref 5–15)
BUN: 39 mg/dL — ABNORMAL HIGH (ref 8–23)
CO2: 36 mmol/L — ABNORMAL HIGH (ref 22–32)
Calcium: 8.5 mg/dL — ABNORMAL LOW (ref 8.9–10.3)
Chloride: 98 mmol/L (ref 98–111)
Creatinine, Ser: 0.99 mg/dL (ref 0.44–1.00)
GFR, Estimated: >60 mL/min (ref >60)
Glucose, Bld: 151 mg/dL — ABNORMAL HIGH (ref 70–99)
Potassium: 4.6 mmol/L (ref 3.5–5.1)
Sodium: 144 mmol/L (ref 135–145)
Total Bilirubin: 0.6 mg/dL (ref 0.3–1.2)
Total Protein: 6.9 g/dL (ref 6.5–8.1)

## 2021-10-21 LAB — CBC WITH DIFFERENTIAL/PLATELET
Abs Immature Granulocytes: 0.03 10*3/uL (ref 0.00–0.07)
Basophils Absolute: 0 10*3/uL (ref 0.0–0.1)
Basophils Relative: 1 %
Eosinophils Absolute: 0 10*3/uL (ref 0.0–0.5)
Eosinophils Relative: 0 %
HCT: 33.1 % — ABNORMAL LOW (ref 36.0–46.0)
Hemoglobin: 10.4 g/dL — ABNORMAL LOW (ref 12.0–15.0)
Immature Granulocytes: 1 %
Lymphocytes Relative: 4 %
Lymphs Abs: 0.1 10*3/uL — ABNORMAL LOW (ref 0.7–4.0)
MCH: 29.6 pg (ref 26.0–34.0)
MCHC: 31.4 g/dL (ref 30.0–36.0)
MCV: 94.3 fL (ref 80.0–100.0)
Monocytes Absolute: 0 10*3/uL — ABNORMAL LOW (ref 0.1–1.0)
Monocytes Relative: 1 %
Neutro Abs: 2.1 10*3/uL (ref 1.7–7.7)
Neutrophils Relative %: 93 %
Platelets: 452 10*3/uL — ABNORMAL HIGH (ref 150–400)
RBC: 3.51 MIL/uL — ABNORMAL LOW (ref 3.87–5.11)
RDW: 13.9 % (ref 11.5–15.5)
WBC: 2.2 10*3/uL — ABNORMAL LOW (ref 4.0–10.5)
nRBC: 0 % (ref 0.0–0.2)

## 2021-10-21 LAB — C-REACTIVE PROTEIN: CRP: 16 mg/dL — ABNORMAL HIGH (ref <1.0)

## 2021-10-21 LAB — PHOSPHORUS: Phosphorus: 3.1 mg/dL (ref 2.5–4.6)

## 2021-10-21 LAB — FERRITIN: Ferritin: 502 ng/mL — ABNORMAL HIGH (ref 11–307)

## 2021-10-21 LAB — PROCALCITONIN: Procalcitonin: 0.31 ng/mL

## 2021-10-21 LAB — D-DIMER, QUANTITATIVE: D-Dimer, Quant: 2.81 ug/mL-FEU — ABNORMAL HIGH (ref 0.00–0.50)

## 2021-10-21 LAB — MAGNESIUM: Magnesium: 2.3 mg/dL (ref 1.7–2.4)

## 2021-10-21 LAB — LACTIC ACID, PLASMA: Lactic Acid, Venous: 1.3 mmol/L (ref 0.5–1.9)

## 2021-10-21 MED ORDER — FUROSEMIDE 10 MG/ML IJ SOLN
40.0000 mg | Freq: Once | INTRAMUSCULAR | Status: AC
  Administered 2021-10-21: 40 mg via INTRAVENOUS
  Filled 2021-10-21: qty 4

## 2021-10-21 MED ORDER — SODIUM CHLORIDE 0.9 % IV SOLN
100.0000 mg | Freq: Every day | INTRAVENOUS | Status: DC
  Filled 2021-10-21 (×3): qty 20

## 2021-10-21 MED ORDER — SODIUM CHLORIDE 0.9 % IV SOLN
500.0000 mg | INTRAVENOUS | Status: DC
  Administered 2021-10-21 – 2021-10-23 (×3): 500 mg via INTRAVENOUS
  Filled 2021-10-21 (×3): qty 5

## 2021-10-21 MED ORDER — SODIUM CHLORIDE 0.9 % IV SOLN
200.0000 mg | Freq: Once | INTRAVENOUS | Status: AC
  Administered 2021-10-21: 200 mg via INTRAVENOUS
  Filled 2021-10-21: qty 40

## 2021-10-21 MED ORDER — ALBUMIN HUMAN 25 % IV SOLN
12.5000 g | Freq: Every day | INTRAVENOUS | Status: DC
  Administered 2021-10-21 – 2021-10-24 (×4): 12.5 g via INTRAVENOUS
  Filled 2021-10-21 (×4): qty 50

## 2021-10-21 MED ORDER — IPRATROPIUM-ALBUTEROL 20-100 MCG/ACT IN AERS
1.0000 | INHALATION_SPRAY | Freq: Four times a day (QID) | RESPIRATORY_TRACT | Status: DC
  Administered 2021-10-21 – 2021-10-23 (×5): 1 via RESPIRATORY_TRACT
  Filled 2021-10-21: qty 4

## 2021-10-21 NOTE — Progress Notes (Signed)
Pt removed from BiPAP and placed on HHFNC 40L 33%. Will continue to monitor pt closely

## 2021-10-21 NOTE — ED Notes (Signed)
Hospital bed ordered for pt for comfort.

## 2021-10-21 NOTE — ED Notes (Signed)
Pt placed in hospital bed for comfort. Warm Blankets and pillow provided.

## 2021-10-21 NOTE — Progress Notes (Signed)
BiPAP alarming "Pt disconnect". Pt found with tubing disconnected at mask. Tubing reconnected. Will continue to monitor closely

## 2021-10-21 NOTE — Consult Note (Signed)
NAME:  Stacie [name], MRN:  [id number], DOB:  07/02/1956, LOS: 2 ADMISSION DATE:  10/19/2021, CONSULTATION DATE:  10/20/2021 REFERRING MD:  Judd Gaudier MD  CHIEF COMPLAINT: SOB   HPI  66 y.o female with significant PMH of  chronic respiratory failure with hypoxia, Arrhythmia, Asthma, Chronic kidney disease, Depression, GERD, Heart murmur, History of kidney stones, hard of hearing, Hyperthyroidism, Hypothyroidism, IBS, Pneumonia, Pulmonary hypertension, Sarcoidosis, Sleep apnea (CPAP with O2), Stroke(07/2020) presented to the ED with acute onset of worsening dyspnea associated with productive yellow sputum cough, wheezing, chills without fever mild abdominal pain.  ED Course: On arrival to the ED, she was afebrile with blood pressure  mm Hg and pulse rate  beats/min.  Patient was noted to be using accessory muscle while breathing.  EKG showed sinus tachycardia with a rate of 125 bpm with left atrial enlargement.  Today showed stable extensive chronic findings consistent with sarcoidosis and interval development of mild right flank scaring and atelectasis.  CTA showed no evidence of pulmonary embolism with stable findings consistent with end-stage sarcoidosis.  Pertinent labs revealed Na+/ K+: 139/4.0, Glucose: 257, BUN/Cr.: 29/1.10 , WBC 5.0/ afebrile, Hgb/Hct: 11.6/37.2, PCT: 0.60, Lactic acid: 2.1, COVID PCR: positive, Troponin , BNP: 104.4 Venous Blood Gas result:  pO2 <31; pCO2 63; pH 7.31;  HCO3 31.7, %O2 Sat 33.6.  Patient initially was placed on 6 L via nasal cannula but she later required BiPAP at 40% FiO2 with pulse ox reading of 100%.  Patient received 125 mg solu-medrol, 2g mag, 2 duo-neb, 2 albuterol and 0.3 mg epi IM.  Given finding as above patient was admitted to progressive unit for further evaluation and management per hospitalist service.  PCCM consulted to assist with optimizing pulmonary status.  Past Medical History  Acute on chronic respiratory failure with hypoxia ([place]),  Arrhythmia, Asthma, Chronic kidney disease, Depression, GERD (gastroesophageal reflux disease), Heart murmur, History of kidney stones, HOH (hard of hearing), Hyperthyroidism, Hypothyroidism, IBS (irritable bowel syndrome), Pneumonia, Pulmonary hypertension ([place]), Sarcoid, Sarcoidosis, Seasonal allergies, Sleep apnea (CPAP with O2), Stroke ([place]) (07/2020), and Wears hearing aid in both ears.  Significant Hospital Events   1/3: Admitted for acute hypoxic respiratory failure secondary to COVID-19 with Acute Pneumonia  1/4: PCCM consulted  Consults:  PCCM Podiatry  Procedures:  None  Significant Diagnostic Tests:  1/3: Chest Xray>showed stable extensive chronic findings consistent with sarcoidosis and interval development of mild mild right lung scarring and/or atelectasis. 1/3: CTA Chest>1. No evidence of pulmonary embolism. 2. Stable findings consistent with end-stage sarcoid. 3. 4 mm and 8 mm ill-defined subsolid left lower lobe lung nodules. Non-contrast chest CT at 3-6 months is recommended. If nodules persist and are stable at that time, consider additional non-contrast chest CT examinations at 2 and 4 years. This recommendation follows the consensus statement: Guidelines for Management of Incidental Pulmonary Nodules Detected on CT Images: From the Fleischner Society 2017; Radiology 2017; 284:228-243. 4. Small hiatal hernia. 5. Evidence of prior vertebroplasty throughout multiple lower thoracic and upper lumbar spine vertebra. 6. Aortic atherosclerosis  Micro Data:  1/3: SARS-CoV-2 PCR> POSITIVE 1/3: Influenza PCR> negative 1/5: Blood culture x2> 1/5: Urine Culture> 1/5: MRSA PCR>>  1/5: Strep pneumo urinary antigen> 1/5: Legionella urinary antigen> 1/5: Mycoplasma pneumonia>  Antimicrobials:  Azithromycin 1/3> Ceftriaxone 1/3>  OBJECTIVE  Blood pressure (!) 110/91, pulse 81, temperature 98.7 F (37.1 C), temperature source Oral, resp. rate 20, SpO2 99 %.    FiO2  (%):  [33 %] 33 %  Intake/Output Summary (Last 24 hours) at 10/21/2021 0338 Last data filed at 10/20/2021 2225 Gross per 24 hour  Intake 1742.12 ml  Output --  Net 1742.12 ml   There were no vitals filed for this visit.   Physical Examination  GENERAL: 66 year-old critically ill patient lying in the bed with no acute distress.  EYES: Pupils equal, round, reactive to light and accommodation. No scleral icterus. Extraocular muscles intact.  HEENT: Head atraumatic, normocephalic. Oropharynx and nasopharynx clear.  NECK:  Supple, no jugular venous distention. No thyroid enlargement, no tenderness.  LUNGS: Normal breath sounds bilaterally, no wheezing, rales,rhonchi or crepitation. No use of accessory muscles of respiration.  CARDIOVASCULAR: S1, S2 normal. No murmurs, rubs, or gallops.  ABDOMEN: Soft, nontender, nondistended. Bowel sounds present. No organomegaly or mass.  EXTREMITIES: No pedal edema, cyanosis, or clubbing.  NEUROLOGIC: Cranial nerves II through XII are intact.  Muscle strength 5/5 in all extremities. Sensation intact. Gait not checked.  PSYCHIATRIC: The patient is alert and oriented x 3.  SKIN: No obvious rash, lesion, or ulcer.   Labs/imaging that I havepersonally reviewed  (right click and "Reselect all SmartList Selections" daily)     Labs   CBC: Recent Labs  Lab 10/19/21 2024 10/20/21 0806  WBC 5.0 2.3*  HGB 11.6* 10.2*  HCT 37.2 33.0*  MCV 96.9 95.9  PLT 385 250    Basic Metabolic Panel: Recent Labs  Lab 10/19/21 2024 10/20/21 0806  NA 139 138  K 4.0 4.3  CL 99 98  CO2 29 29  GLUCOSE 257* 199*  BUN 29* 31*  CREATININE 1.10* 1.05*  CALCIUM 8.9 8.2*   GFR: Estimated Creatinine Clearance: 57 mL/min (A) (by C-G formula based on SCr of 1.05 mg/dL (H)). Recent Labs  Lab 10/19/21 2024 10/20/21 0125 10/20/21 0806  PROCALCITON  --  0.60  --   WBC 5.0  --  2.3*    Liver Function Tests: No results for input(s): AST, ALT, ALKPHOS, BILITOT, PROT,  ALBUMIN in the last 168 hours. No results for input(s): LIPASE, AMYLASE in the last 168 hours. No results for input(s): AMMONIA in the last 168 hours.  ABG    Component Value Date/Time   PHART 7.41 07/16/2020 0841   PCO2ART 52 (H) 07/16/2020 0841   PO2ART 76 (L) 07/16/2020 0841   HCO3 31.7 (H) 10/19/2021 2032   TCO2 30 01/14/2021 1138   ACIDBASEDEF 0.5 07/12/2020 1205   O2SAT 33.6 10/19/2021 2032     Coagulation Profile: No results for input(s): INR, PROTIME in the last 168 hours.  Cardiac Enzymes: No results for input(s): CKTOTAL, CKMB, CKMBINDEX, TROPONINI in the last 168 hours.  HbA1C: Hgb A1c MFr Bld  Date/Time Value Ref Range Status  07/16/2020 07:06 AM 6.4 (H) 4.8 - 5.6 % Final    Comment:    (NOTE) Pre diabetes:          5.7%-6.4%  Diabetes:              >6.4%  Glycemic control for   <7.0% adults with diabetes   04/20/2017 09:09 AM 6.0 (H) 4.8 - 5.6 % Final    Comment:             Pre-diabetes: 5.7 - 6.4          Diabetes: >6.4          Glycemic control for adults with diabetes: <7.0     CBG: No results for input(s): GLUCAP in the last 168 hours.  Review  of Systems:   10 point ROS done and is negative except for chest discomfort  Past Medical History  She,  has a past medical history of Acute respiratory failure with hypoxia ([place]), Arrhythmia, Asthma, Chronic kidney disease, Depression, GERD (gastroesophageal reflux disease), Heart murmur, History of kidney stones, HOH (hard of hearing), Hyperthyroidism, Hypothyroidism, IBS (irritable bowel syndrome), Pneumonia, Pulmonary hypertension ([place]), Sarcoid, Sarcoidosis, Seasonal allergies, Sleep apnea (CPAP with O2), Stroke ([place]) (07/2020), and Wears hearing aid in both ears.   Surgical History    Past Surgical History:  Procedure Laterality Date   ABDOMINAL HYSTERECTOMY     partial   AMPUTATION Left 01/14/2021   Procedure: AMPUTATION RAY (1ST TOE ) ( 2ND METATARSAL HEAD RESECTION);  Surgeon: Algernon Huxley,  MD;  Location: [facility];  Service: Vascular;  Laterality: Left;   CARDIAC CATHETERIZATION  10/18/2018   [facility]   CATARACT EXTRACTION W/PHACO Left 07/31/2019   Procedure: CATARACT EXTRACTION PHACO AND INTRAOCULAR LENS PLACEMENT (IOC) LEFT 00:51.1  17.9%  9.15;  Surgeon: Leandrew Koyanagi, MD;  Location: [facility];  Service: Ophthalmology;  Laterality: Left;  keep this patient second   COLON SURGERY     "colon was fused to bladder - operated on both"   COLONOSCOPY  09/18/2007   diverticuli, no polyps   COLONOSCOPY  05/26/2010   diverticuli, no polyps   CYSTOSCOPY WITH STENT PLACEMENT Bilateral 07/14/2020   Procedure: CYSTOSCOPY WITH STENT PLACEMENT, RETROPYLOGRAM;  Surgeon: Billey Co, MD;  Location: [facility];  Service: Urology;  Laterality: Bilateral;   CYSTOSCOPY/URETEROSCOPY/HOLMIUM LASER/STENT PLACEMENT Left 02/20/2020   Procedure: CYSTOSCOPY/URETEROSCOPY/LITHOTRIPSY /STENT PLACEMENT;  Surgeon: Hollice Espy, MD;  Location: [facility];  Service: Urology;  Laterality: Left;   CYSTOSCOPY/URETEROSCOPY/HOLMIUM LASER/STENT PLACEMENT Bilateral 10/02/2020   Procedure: CYSTOSCOPY/URETEROSCOPY/HOLMIUM LASER/STENT PLACEMENT;  Surgeon: Billey Co, MD;  Location: [facility];  Service: Urology;  Laterality: Bilateral;   EYE SURGERY     KYPHOPLASTY N/A 05/10/2021   Procedure: KYPHOPLASTY, L2, L3, L4;  Surgeon: Hessie Knows, MD;  Location: [facility];  Service: Orthopedics;  Laterality: N/A;   KYPHOPLASTY N/A 06/25/2021   Procedure: T10, T12, L1 KYPHOPLASTY;  Surgeon: Hessie Knows, MD;  Location: [facility];  Service: Orthopedics;  Laterality: N/A;   PARS PLANA VITRECTOMY Right 05/20/2015   Procedure: PARS PLANA VITRECTOMY WITH 25 GAUGE, laser;  Surgeon: Milus Height, MD;  Location: [facility];  Service: Ophthalmology;  Laterality: Right;   PARTIAL HYSTERECTOMY  1990   TEE WITHOUT CARDIOVERSION N/A 07/27/2020   Procedure: TRANSESOPHAGEAL ECHOCARDIOGRAM (TEE);  Surgeon: Teodoro Spray, MD;   Location: [facility];  Service: Cardiovascular;  Laterality: N/A;   TUBAL LIGATION     WISDOM TOOTH EXTRACTION       Social History   reports that she has never smoked. She has never used smokeless tobacco. She reports that she does not drink alcohol and does not use drugs.   Family History   Her family history includes Allergies in her brother and father; Asthma in her brother and father; Breast cancer in her maternal grandmother; Colon cancer in her father.   Allergies Allergies  Allergen Reactions   Corn-Containing Products     Upset stomach    Nitrofurantoin Nausea Only   Plaquenil [Hydroxychloroquine]     Unknown reaction    Tramadol Nausea And Vomiting   Sulfa Antibiotics Rash    As an infant     Home Medications  Prior to Admission medications   Medication Sig Start Date End Date Taking? Authorizing  Provider  acetaminophen (TYLENOL) 500 MG tablet Take 500 mg by mouth every 6 (six) hours as needed for moderate pain or headache.   Yes [provider]  albuterol (PROVENTIL) (2.5 MG/3ML) 0.083% nebulizer solution Inhale 2.5 mg into the lungs every 6 (six) hours as needed for wheezing or shortness of breath. 01/04/21 01/04/22 Yes [provider]  budesonide-formoterol (SYMBICORT) 80-4.5 MCG/ACT inhaler Take 2 puffs first thing in am and then another 2 puffs about 12 hours later. Patient taking differently: Inhale 2 puffs into the lungs in the morning and at bedtime. 08/13/20  Yes Angiulli, Lavon Paganini, PA-C  cefdinir (OMNICEF) 300 MG capsule Take 1 capsule (300 mg total) by mouth 2 (two) times daily for 10 days. 10/13/21 10/23/21 Yes Glean Hess, MD  dorzolamide (TRUSOPT) 2 % ophthalmic solution Place 1 drop into both eyes 2 (two) times daily.   Yes [provider]  escitalopram (LEXAPRO) 20 MG tablet TAKE 1 TABLET BY MOUTH EVERY DAY 08/18/21  Yes Glean Hess, MD  folic acid (FOLVITE) 1 MG tablet Take 1 tablet (1 mg total) by mouth daily. 08/13/20  Yes  Angiulli, Lavon Paganini, PA-C  HYDROcodone bit-homatropine (HYCODAN) 5-1.5 MG/5ML syrup Take 5 mLs by mouth every 6 (six) hours as needed for up to 10 days for cough. 10/13/21 10/23/21 Yes Glean Hess, MD  latanoprost (XALATAN) 0.005 % ophthalmic solution Place 1 drop into the right eye 2 (two) times daily.   Yes [provider]  levothyroxine (SYNTHROID) 137 MCG tablet TAKE 1 TABLET (137 MCG TOTAL) BY MOUTH DAILY BEFORE BREAKFAST. Patient taking differently: Take 137 mcg by mouth daily before breakfast. 12/08/20  Yes Glean Hess, MD  Menthol (ICY HOT) 5 % [facility] Apply 1 patch topically daily as needed (pain).   Yes [provider]  methotrexate (RHEUMATREX) 2.5 MG tablet Take 10 tablets (25 mg total) by mouth every Tuesday. 02/23/21  Yes Elgergawy, Silver Huguenin, MD  Multiple Vitamins-Minerals (MULTIVITAMIN WITH MINERALS) tablet Take 1 tablet by mouth daily.   Yes [provider]  naproxen sodium (ALEVE) 220 MG tablet Take 220 mg by mouth daily as needed (pain).   Yes [provider]  potassium citrate (UROCIT-K) 10 MEQ (1080 MG) SR tablet Take 20 mEq by mouth 2 (two) times daily. 05/31/21  Yes [provider]  predniSONE (DELTASONE) 10 MG tablet Take 19 mg by mouth daily.   Yes [provider]  Probiotic Product (PROBIOTIC PO) Take 1 capsule by mouth daily.   Yes [provider]  Treprostinil (TYVASO) 0.6 MG/ML SOLN Inhale into the lungs See admin instructions. 12 breaths 4 times a day   Yes [provider]  cyclobenzaprine (FLEXERIL) 5 MG tablet Take 5-10 mg by mouth 3 (three) times daily as needed. 07/09/21   [provider]  denosumab (PROLIA) 60 MG/ML SOSY injection Inject 60 mg into the skin every 6 (six) months.    [provider]  HYDROcodone-acetaminophen (NORCO) 5-325 MG tablet Take 1 tablet by mouth every 6 (six) hours as needed for moderate pain. Patient not taking: Reported on 10/19/2021 06/25/21   Hessie Knows, MD  NON FORMULARY CPAP nightly    [provider]  OXYGEN Inhale 2 L into the lungs daily as needed (During walking and activity).     [provider]  Scheduled Meds:  acidophilus  1 capsule Oral Daily   vitamin C  500 mg Oral Daily   cholecalciferol  1,000 Units Oral Daily  dorzolamide  1 drop Both Eyes BID   enoxaparin (LOVENOX) injection  40 mg Subcutaneous QHS   escitalopram  20 mg Oral Daily   folic acid  1 mg Oral Daily   guaiFENesin  600 mg Oral BID   latanoprost  1 drop Right Eye BID   levothyroxine  137 mcg Oral QAC breakfast   methylPREDNISolone (SOLU-MEDROL) injection  80 mg Intravenous Q12H   Followed by   Derrill Memo ON 10/23/2021] predniSONE  50 mg Oral Daily   multivitamin with minerals  1 tablet Oral Daily   potassium chloride SA  20 mEq Oral BID   Treprostinil  72 mcg Inhalation QID   zinc sulfate  220 mg Oral Daily   Continuous Infusions:  azithromycin     cefTRIAXone (ROCEPHIN)  IV Stopped (10/20/21 2225)   remdesivir 200 mg in sodium chloride 0.9% 250 mL IVPB     Followed by   remdesivir 100 mg in NS 100 mL     PRN Meds:.acetaminophen **OR** acetaminophen, albuterol, chlorpheniramine-HYDROcodone, cyclobenzaprine, guaiFENesin-dextromethorphan, magnesium hydroxide, metoprolol tartrate, Muscle Rub, ondansetron **OR** ondansetron (ZOFRAN) IV, traZODone      Assessment & Plan:  Acute on Chronic Hypoxic Respiratory Failure secondary to COVID-19 Pneumonia  AND Asthma exacerbation PMHx: ILD, interstitial lung disease on chronic home O2, sarcoidosis, asthma -Supplemental O2 as needed to maintain O2 saturations 88 to 92% -Follow intermittent ABG and chest x-ray as needed -Continue remdesivir x5 days  -Continue steroids. -Empiric abx coverage given risk factors (ILD+ Chronic renal disease) with Ceftriaxone + Azithromycin pending cultures, may broaden if appropriate -Ensure adequate pulmonary hygiene  -Budesonide inhaler nebs BID,  bronchodilators PRNBronchodilators PRN -Encourage OOB, IS, FV, and awake proning if able -Continue airborne, contact precautions for 21 days from positive testing. -Monitor CMP and inflammatory markers -Heparin prophylactic dose.   Sepsis due to osteomyelitis of left toe  -Supplemental oxygen as needed, to maintain SpO2 > 90% -F/u cultures, trend lactic/ PCT -Monitor WBC/ fever curve -Gentle IVF hydration as needed -Repeat blood cultures until clear; follow up trach aspirate -PRN Pressors for MAP goal >65 -Strict I/O's -Podiatry following with no recommendations at this time. Will continue to follow cultures as above    AKI on CKD Stage III -Monitor I&O's / urinary output -Follow BMP -Ensure adequate renal perfusion -Avoid nephrotoxic agents as able -Replace electrolytes as indicated   Hypothyroidism -Continue Synthroid  Best practice:  Diet:  Oral Pain/Anxiety/Delirium protocol (if indicated): Yes (RASS goal 0) VAP protocol (if indicated): Not indicated DVT prophylaxis: LMWH GI prophylaxis: PPI Glucose control:  SSI Yes Central venous access:  N/A Arterial line:  N/A Foley:  no Mobility:  OOB  PT consulted: Yes Last date of multidisciplinary goals of care discussion [full code] Code Status:  DNR Disposition: SDU   = Goals of Care = Code Status Order: _0 @   Primary Emergency Contact: [place] Wishes to pursue full aggressive treatment and intervention options, including CPR and intubation, but goals of care will be addressed on going with family if that should become necessary.  Critical care provider statement:   Total critical care time: 33 minutes   Performed by: Lanney Gins MD   Critical care time was exclusive of separately billable procedures and treating other patients.   Critical care was necessary to treat or prevent imminent or life-threatening deterioration.   Critical care was time spent personally by me on the following activities:  development of treatment plan with patient and/or surrogate as well as nursing, discussions with  consultants, evaluation of patient's response to treatment, examination of patient, obtaining history from patient or surrogate, ordering and performing treatments and interventions, ordering and review of laboratory studies, ordering and review of radiographic studies, pulse oximetry and re-evaluation of patient's condition.    Ottie Glazier, M.D.  Pulmonary & Critical Care Medicine     .

## 2021-10-21 NOTE — Progress Notes (Signed)
PROGRESS NOTE  Stacie [name] PJA:[id number] DOB: 10-Sep-1956 DOA: 10/19/2021 PCP: Glean Hess, MD  HPI/Recap of past 24 hours: Stacie [name] is a 65 y.o. Caucasian female with medical history significant for GERD, hypothyroidism, sarcoidosis, sleep apnea, CVA, asthma, depression, and urolithiasis, who presented to [facility] ER with acute onset of worsening dyspnea.  Associated with a productive cough of yellow sputum, wheezing, worsening over the last week.  Work-up in the ED revealed COVID-19 viral infection, acute hypoxic respiratory failure requiring BiPAP.  She was started on antiviral IV remdesivir, IV antibiotics empirically, bronchodilators and IV steroids.  To note first dose of remdesivir was given on 10/21/2021.  10/21/2021: Seen and examined with her daughter at her bedside.  Breathing is slightly improved.  Still requiring BiPAP nightly and heated high flow nasal cannula during the day.  Productive cough noted on exam.  Assessment/Plan: Principal Problem:   ARF (acute respiratory failure) (HCC)  Persistent acute hypoxic respiratory failure, multifactorial, secondary to COVID-19 viral infection, asthma exacerbation.   Not on oxygen supplementation at baseline Personally reviewed chest x-ray done on admission which shows bilateral pulmonary infiltrates left greater than right. COVID-19 screening test positive on 10/19/2021.   She was started on antiviral IV remdesivir, IV antibiotics Rocephin empirically, bronchodilators and IV steroids, continue. IV antiviral, remdesivir started on 10/21/2021. Continue pulmonary toilet BiPAP nightly, heated high flow nasal cannula during the day. Continue to wean off oxygen supplementation as tolerated Incentive spirometer, flutter valve Mobilize as tolerated Antitussives Follow-up sputum culture and blood cultures.  COVID-19 viral infection, POA Management as stated above.  Asthma exacerbation secondary to above. Continue IV steroids,  bronchodilators Maintain O2 saturation greater than 90%.  Left forefoot ulceration post first and second partial ray resections, POA Seen by podiatry, recommended local wound care, signed off 10/20/2021. Left foot x-ray was obtained. MRSA screen is pending Ordered CBC pending  Leukopenia with concern for sepsis secondary to left great toe infection. WBC 2.3, pulse 112, respiratory 21. Obtain blood cultures x2 peripherally 10/20/2021. Monitor fever curve and WBC  Rheumatoid arthritis. Hold off methotrexate for now Continue IV steroids  Hypothyroidism. - We will continue Synthroid.  Depression. - We will continue Lexapro  Physical debility PT OT assessment Fall precautions.        DVT prophylaxis: Lovenox subcu daily. Code Status: full code. Family Communication: Daughter at bedside.    Disposition Plan: Back to previous home environment Consults called: none. All the records are reviewed and case discussed with ED provider.   Status is: Inpatient  Patient requires at least 2 midnights for further evaluation and treatment of pain condition.      Objective: Vitals:   10/21/21 0744 10/21/21 0749 10/21/21 1000 10/21/21 1310  BP:   (!) 155/84 (!) 166/84  Pulse: 95  94 80  Resp: (!) 28  (!) 27 (!) 26  Temp:   97.9 F (36.6 C)   TempSrc:   Axillary   SpO2: 100% 99% 99% 96%    Intake/Output Summary (Last 24 hours) at 10/21/2021 1323 Last data filed at 10/20/2021 2225 Gross per 24 hour  Intake 1742.12 ml  Output --  Net 1742.12 ml   There were no vitals filed for this visit.  Exam:  General: 66 y.o. year-old female well-developed well-nourished in no acute distress.  She is alert and oriented x3. Cardiovascular: Regular rate and rhythm no rubs or gallops.   Respiratory: Mild diffuse wheezing bilaterally.  Mild rales at bases.  Good inspiratory effort.  Abdomen: Soft nontender normal bowel sounds present. Musculoskeletal: No lower extremity edema  bilaterally.  Skin: Left forefoot ulcerative lesion. Psychiatry: Mood is appropriate for condition and setting. Neuro: Nonfocal, moves all 4 extremities equally.   Data Reviewed: CBC: Recent Labs  Lab 10/19/21 2024 10/20/21 0806  WBC 5.0 2.3*  HGB 11.6* 10.2*  HCT 37.2 33.0*  MCV 96.9 95.9  PLT 385 157   Basic Metabolic Panel: Recent Labs  Lab 10/19/21 2024 10/20/21 0806 10/21/21 0720  NA 139 138 144  K 4.0 4.3 4.6  CL 99 98 98  CO2 29 29 36*  GLUCOSE 257* 199* 151*  BUN 29* 31* 39*  CREATININE 1.10* 1.05* 0.99  CALCIUM 8.9 8.2* 8.5*  MG  --   --  2.3  PHOS  --   --  3.1   GFR: Estimated Creatinine Clearance: 60.4 mL/min (by C-G formula based on SCr of 0.99 mg/dL). Liver Function Tests: Recent Labs  Lab 10/21/21 0720  AST 28  ALT 49*  ALKPHOS 71  BILITOT 0.6  PROT 6.9  ALBUMIN 2.5*   No results for input(s): LIPASE, AMYLASE in the last 168 hours. No results for input(s): AMMONIA in the last 168 hours. Coagulation Profile: No results for input(s): INR, PROTIME in the last 168 hours. Cardiac Enzymes: No results for input(s): CKTOTAL, CKMB, CKMBINDEX, TROPONINI in the last 168 hours. BNP (last 3 results) No results for input(s): PROBNP in the last 8760 hours. HbA1C: No results for input(s): HGBA1C in the last 72 hours. CBG: No results for input(s): GLUCAP in the last 168 hours. Lipid Profile: No results for input(s): CHOL, HDL, LDLCALC, TRIG, CHOLHDL, LDLDIRECT in the last 72 hours. Thyroid Function Tests: No results for input(s): TSH, T4TOTAL, FREET4, T3FREE, THYROIDAB in the last 72 hours. Anemia Panel: Recent Labs    10/20/21 0806 10/21/21 0709  FERRITIN 559* 502*   Urine analysis:    Component Value Date/Time   COLORURINE STRAW (A) 05/06/2021 1616   APPEARANCEUR CLEAR (A) 05/06/2021 1616   APPEARANCEUR Clear 04/21/2021 1551   LABSPEC 1.010 05/06/2021 1616   PHURINE 8.0 05/06/2021 1616   GLUCOSEU NEGATIVE 05/06/2021 1616   HGBUR SMALL  (A) 05/06/2021 1616   BILIRUBINUR NEGATIVE 05/06/2021 1616   BILIRUBINUR Negative 04/21/2021 1551   KETONESUR NEGATIVE 05/06/2021 1616   PROTEINUR NEGATIVE 05/06/2021 1616   UROBILINOGEN 0.2 01/07/2020 1406   UROBILINOGEN 0.2 11/25/2014 1849   NITRITE NEGATIVE 05/06/2021 1616   LEUKOCYTESUR NEGATIVE 05/06/2021 1616   Sepsis Labs: _0 (procalcitonin:4,lacticidven:4)  ) Recent Results (from the past 240 hour(s))  Resp Panel by RT-PCR (Flu A&B, Covid) Nasopharyngeal Swab     Status: Abnormal   Collection Time: 10/19/21  8:24 PM   Specimen: Nasopharyngeal Swab; Nasopharyngeal(NP) swabs in vial transport medium  Result Value Ref Range Status   SARS Coronavirus 2 by RT PCR POSITIVE (A) NEGATIVE Final    Comment: (NOTE) SARS-CoV-2 target nucleic acids are DETECTED.  The SARS-CoV-2 RNA is generally detectable in upper respiratory specimens during the acute phase of infection. Positive results are indicative of the presence of the identified virus, but do not rule out bacterial infection or co-infection with other pathogens not detected by the test. Clinical correlation with patient history and other diagnostic information is necessary to determine patient infection status. The expected result is Negative.  Fact Sheet for Patients: EntrepreneurPulse.com.au  Fact Sheet for Healthcare Providers: IncredibleEmployment.be  This test is not yet approved or cleared by the [place] FDA and  has been  authorized for detection and/or diagnosis of SARS-CoV-2 by FDA under an Emergency Use Authorization (EUA).  This EUA will remain in effect (meaning this test can be used) for the duration of  the COVID-19 declaration under Section 564(b)(1) of the A ct, 21 U.S.C. section 360bbb-3(b)(1), unless the authorization is terminated or revoked sooner.     Influenza A by PCR NEGATIVE NEGATIVE Final   Influenza B by PCR NEGATIVE NEGATIVE Final    Comment:  (NOTE) The Xpert Xpress SARS-CoV-2/FLU/RSV plus assay is intended as an aid in the diagnosis of influenza from Nasopharyngeal swab specimens and should not be used as a sole basis for treatment. Nasal washings and aspirates are unacceptable for Xpert Xpress SARS-CoV-2/FLU/RSV testing.  Fact Sheet for Patients: EntrepreneurPulse.com.au  Fact Sheet for Healthcare Providers: IncredibleEmployment.be  This test is not yet approved or cleared by the [place] FDA and has been authorized for detection and/or diagnosis of SARS-CoV-2 by FDA under an Emergency Use Authorization (EUA). This EUA will remain in effect (meaning this test can be used) for the duration of the COVID-19 declaration under Section 564(b)(1) of the Act, 21 U.S.C. section 360bbb-3(b)(1), unless the authorization is terminated or revoked.  Performed at [facility], [address] [place], [place] [address]       Studies: No results found.  Scheduled Meds:  acidophilus  1 capsule Oral Daily   vitamin C  500 mg Oral Daily   cholecalciferol  1,000 Units Oral Daily   dorzolamide  1 drop Both Eyes BID   enoxaparin (LOVENOX) injection  40 mg Subcutaneous QHS   escitalopram  20 mg Oral Daily   folic acid  1 mg Oral Daily   guaiFENesin  600 mg Oral BID   Ipratropium-Albuterol  1 puff Inhalation Q6H   latanoprost  1 drop Right Eye BID   levothyroxine  137 mcg Oral QAC breakfast   methylPREDNISolone (SOLU-MEDROL) injection  80 mg Intravenous Q12H   Followed by   Derrill Memo ON 10/23/2021] predniSONE  50 mg Oral Daily   multivitamin with minerals  1 tablet Oral Daily   potassium chloride SA  20 mEq Oral BID   Treprostinil  72 mcg Inhalation QID   zinc sulfate  220 mg Oral Daily    Continuous Infusions:  azithromycin Stopped (10/21/21 1209)   cefTRIAXone (ROCEPHIN)  IV Stopped (10/20/21 2225)   [START ON 10/22/2021] remdesivir 100 mg in NS 100 mL       LOS: 2 days      Kayleen Memos, MD [facility] Pager [phone]  If 7PM-7AM, please contact night-coverage [email or web address] Password [email or web address] 10/21/2021, 1:23 PM

## 2021-10-22 DIAGNOSIS — J9601 Acute respiratory failure with hypoxia: Secondary | ICD-10-CM | POA: Diagnosis not present

## 2021-10-22 LAB — BASIC METABOLIC PANEL
Anion gap: 10 (ref 5–15)
BUN: 44 mg/dL — ABNORMAL HIGH (ref 8–23)
CO2: 36 mmol/L — ABNORMAL HIGH (ref 22–32)
Calcium: 8.5 mg/dL — ABNORMAL LOW (ref 8.9–10.3)
Chloride: 95 mmol/L — ABNORMAL LOW (ref 98–111)
Creatinine, Ser: 0.99 mg/dL (ref 0.44–1.00)
GFR, Estimated: >60 mL/min (ref >60)
Glucose, Bld: 195 mg/dL — ABNORMAL HIGH (ref 70–99)
Potassium: 4.2 mmol/L (ref 3.5–5.1)
Sodium: 141 mmol/L (ref 135–145)

## 2021-10-22 LAB — FERRITIN: Ferritin: 345 ng/mL — ABNORMAL HIGH (ref 11–307)

## 2021-10-22 LAB — C-REACTIVE PROTEIN: CRP: 5.1 mg/dL — ABNORMAL HIGH (ref <1.0)

## 2021-10-22 LAB — D-DIMER, QUANTITATIVE: D-Dimer, Quant: 1.88 ug/mL-FEU — ABNORMAL HIGH (ref 0.00–0.50)

## 2021-10-22 MED ORDER — METHYLPREDNISOLONE SODIUM SUCC 125 MG IJ SOLR
INTRAMUSCULAR | Status: AC
  Administered 2021-10-22: 80 mg via INTRAVENOUS
  Filled 2021-10-22: qty 2

## 2021-10-22 MED ORDER — LIDOCAINE 5 % EX PTCH
1.0000 | MEDICATED_PATCH | CUTANEOUS | Status: DC
  Administered 2021-10-22: 1 via TRANSDERMAL
  Filled 2021-10-22 (×5): qty 1

## 2021-10-22 MED ORDER — BENZONATATE 100 MG PO CAPS
200.0000 mg | ORAL_CAPSULE | Freq: Three times a day (TID) | ORAL | Status: DC
  Administered 2021-10-22 (×3): 200 mg via ORAL
  Filled 2021-10-22 (×3): qty 2

## 2021-10-22 NOTE — Progress Notes (Signed)
NAME:  Stacie [name], MRN:  [id number], DOB:  December 09, 1955, LOS: 3 ADMISSION DATE:  10/19/2021, CONSULTATION DATE:  10/20/2021 REFERRING MD:  Judd Gaudier MD  CHIEF COMPLAINT: SOB   HPI  66 y.o female with significant PMH of  chronic respiratory failure with hypoxia, Arrhythmia, Asthma, Chronic kidney disease, Depression, GERD, Heart murmur, History of kidney stones, hard of hearing, Hyperthyroidism, Hypothyroidism, IBS, Pneumonia, Pulmonary hypertension, Sarcoidosis, Sleep apnea (CPAP with O2), Stroke(07/2020) presented to the ED with acute onset of worsening dyspnea associated with productive yellow sputum cough, wheezing, chills without fever mild abdominal pain.  Past Medical History  Acute on chronic respiratory failure with hypoxia ([place]), Arrhythmia, Asthma, Chronic kidney disease, Depression, GERD (gastroesophageal reflux disease), Heart murmur, History of kidney stones, HOH (hard of hearing), Hyperthyroidism, Hypothyroidism, IBS (irritable bowel syndrome), Pneumonia, Pulmonary hypertension ([place]), Sarcoid, Sarcoidosis, Seasonal allergies, Sleep apnea (CPAP with O2), Stroke ([place]) (07/2020), and Wears hearing aid in both ears.  Significant Hospital Events   1/3: Admitted for acute hypoxic respiratory failure secondary to COVID-19 with Acute Pneumonia  1/4: PCCM consulted  Consults:  PCCM Podiatry  Procedures:  None  Significant Diagnostic Tests:  1/3: Chest Xray>showed stable extensive chronic findings consistent with sarcoidosis and interval development of mild mild right lung scarring and/or atelectasis. 1/3: CTA Chest>1. No evidence of pulmonary embolism. 2. Stable findings consistent with end-stage sarcoid. 3. 4 mm and 8 mm ill-defined subsolid left lower lobe lung nodules. Non-contrast chest CT at 3-6 months is recommended. If nodules persist and are stable at that time, consider additional non-contrast chest CT examinations at 2 and 4 years. This recommendation follows the  consensus statement: Guidelines for Management of Incidental Pulmonary Nodules Detected on CT Images: From the Fleischner Society 2017; Radiology 2017; 284:228-243. 4. Small hiatal hernia. 5. Evidence of prior vertebroplasty throughout multiple lower thoracic and upper lumbar spine vertebra. 6. Aortic atherosclerosis  Micro Data:  1/3: SARS-CoV-2 PCR> POSITIVE 1/3: Influenza PCR> negative 1/5: Blood culture x2> 1/5: Urine Culture> 1/5: MRSA PCR>>  1/5: Strep pneumo urinary antigen> 1/5: Legionella urinary antigen> 1/5: Mycoplasma pneumonia>  Antimicrobials:  Azithromycin 1/3> Ceftriaxone 1/3>  OBJECTIVE  Blood pressure (!) 161/88, pulse 94, temperature 98.3 F (36.8 C), temperature source Oral, resp. rate 18, SpO2 97 %.    FiO2 (%):  [35 %] 35 %   Intake/Output Summary (Last 24 hours) at 10/22/2021 1053 Last data filed at 10/21/2021 2329 Gross per 24 hour  Intake 139.16 ml  Output 1100 ml  Net -960.84 ml    There were no vitals filed for this visit.   Physical Examination  GENERAL: 66 year-old critically ill patient lying in the bed with no acute distress.  EYES: Pupils equal, round, reactive to light and accommodation. No scleral icterus. Extraocular muscles intact.  HEENT: Head atraumatic, normocephalic. Oropharynx and nasopharynx clear.  NECK:  Supple, no jugular venous distention. No thyroid enlargement, no tenderness.  LUNGS: Normal breath sounds bilaterally, no wheezing, rales,rhonchi or crepitation. No use of accessory muscles of respiration.  CARDIOVASCULAR: S1, S2 normal. No murmurs, rubs, or gallops.  ABDOMEN: Soft, nontender, nondistended. Bowel sounds present. No organomegaly or mass.  EXTREMITIES: No pedal edema, cyanosis, or clubbing.  NEUROLOGIC: Cranial nerves II through XII are intact.  Muscle strength 5/5 in all extremities. Sensation intact. Gait not checked.  PSYCHIATRIC: The patient is alert and oriented x 3.  SKIN: No obvious rash, lesion, or ulcer.    Labs/imaging that I havepersonally reviewed  (right click and "Reselect all  SmartList Selections" daily)     Labs   CBC: Recent Labs  Lab 10/19/21 2024 10/20/21 0806 10/21/21 1621  WBC 5.0 2.3* 2.2*  NEUTROABS  --   --  2.1  HGB 11.6* 10.2* 10.4*  HCT 37.2 33.0* 33.1*  MCV 96.9 95.9 94.3  PLT 385 356 452*     Basic Metabolic Panel: Recent Labs  Lab 10/19/21 2024 10/20/21 0806 10/21/21 0720  NA 139 138 144  K 4.0 4.3 4.6  CL 99 98 98  CO2 29 29 36*  GLUCOSE 257* 199* 151*  BUN 29* 31* 39*  CREATININE 1.10* 1.05* 0.99  CALCIUM 8.9 8.2* 8.5*  MG  --   --  2.3  PHOS  --   --  3.1    GFR: Estimated Creatinine Clearance: 60.4 mL/min (by C-G formula based on SCr of 0.99 mg/dL). Recent Labs  Lab 10/19/21 2024 10/20/21 0125 10/20/21 0806 10/21/21 0709 10/21/21 1621  PROCALCITON  --  0.60  --  0.31  --   WBC 5.0  --  2.3*  --  2.2*  LATICACIDVEN  --   --   --  1.3  --      Liver Function Tests: Recent Labs  Lab 10/21/21 0720  AST 28  ALT 49*  ALKPHOS 71  BILITOT 0.6  PROT 6.9  ALBUMIN 2.5*   No results for input(s): LIPASE, AMYLASE in the last 168 hours. No results for input(s): AMMONIA in the last 168 hours.  ABG    Component Value Date/Time   PHART 7.41 07/16/2020 0841   PCO2ART 52 (H) 07/16/2020 0841   PO2ART 76 (L) 07/16/2020 0841   HCO3 31.7 (H) 10/19/2021 2032   TCO2 30 01/14/2021 1138   ACIDBASEDEF 0.5 07/12/2020 1205   O2SAT 33.6 10/19/2021 2032      Coagulation Profile: No results for input(s): INR, PROTIME in the last 168 hours.  Cardiac Enzymes: No results for input(s): CKTOTAL, CKMB, CKMBINDEX, TROPONINI in the last 168 hours.  HbA1C: Hgb A1c MFr Bld  Date/Time Value Ref Range Status  07/16/2020 07:06 AM 6.4 (H) 4.8 - 5.6 % Final    Comment:    (NOTE) Pre diabetes:          5.7%-6.4%  Diabetes:              >6.4%  Glycemic control for   <7.0% adults with diabetes   04/20/2017 09:09 AM 6.0 (H) 4.8 - 5.6 % Final     Comment:             Pre-diabetes: 5.7 - 6.4          Diabetes: >6.4          Glycemic control for adults with diabetes: <7.0     CBG: No results for input(s): GLUCAP in the last 168 hours.  Review of Systems:   10 point ROS done and is negative except for chest discomfort  Past Medical History  She,  has a past medical history of Acute respiratory failure with hypoxia ([place]), Arrhythmia, Asthma, Chronic kidney disease, Depression, GERD (gastroesophageal reflux disease), Heart murmur, History of kidney stones, HOH (hard of hearing), Hyperthyroidism, Hypothyroidism, IBS (irritable bowel syndrome), Pneumonia, Pulmonary hypertension ([place]), Sarcoid, Sarcoidosis, Seasonal allergies, Sleep apnea (CPAP with O2), Stroke ([place]) (07/2020), and Wears hearing aid in both ears.   Surgical History    Past Surgical History:  Procedure Laterality Date   ABDOMINAL HYSTERECTOMY     partial   AMPUTATION Left 01/14/2021  Procedure: AMPUTATION RAY (1ST TOE ) ( 2ND METATARSAL HEAD RESECTION);  Surgeon: Algernon Huxley, MD;  Location: [facility];  Service: Vascular;  Laterality: Left;   CARDIAC CATHETERIZATION  10/18/2018   [facility]   CATARACT EXTRACTION W/PHACO Left 07/31/2019   Procedure: CATARACT EXTRACTION PHACO AND INTRAOCULAR LENS PLACEMENT (IOC) LEFT 00:51.1  17.9%  9.15;  Surgeon: Leandrew Koyanagi, MD;  Location: [facility];  Service: Ophthalmology;  Laterality: Left;  keep this patient second   COLON SURGERY     "colon was fused to bladder - operated on both"   COLONOSCOPY  09/18/2007   diverticuli, no polyps   COLONOSCOPY  05/26/2010   diverticuli, no polyps   CYSTOSCOPY WITH STENT PLACEMENT Bilateral 07/14/2020   Procedure: CYSTOSCOPY WITH STENT PLACEMENT, RETROPYLOGRAM;  Surgeon: Billey Co, MD;  Location: [facility];  Service: Urology;  Laterality: Bilateral;   CYSTOSCOPY/URETEROSCOPY/HOLMIUM LASER/STENT PLACEMENT Left 02/20/2020   Procedure: CYSTOSCOPY/URETEROSCOPY/LITHOTRIPSY /STENT  PLACEMENT;  Surgeon: Hollice Espy, MD;  Location: [facility];  Service: Urology;  Laterality: Left;   CYSTOSCOPY/URETEROSCOPY/HOLMIUM LASER/STENT PLACEMENT Bilateral 10/02/2020   Procedure: CYSTOSCOPY/URETEROSCOPY/HOLMIUM LASER/STENT PLACEMENT;  Surgeon: Billey Co, MD;  Location: [facility];  Service: Urology;  Laterality: Bilateral;   EYE SURGERY     KYPHOPLASTY N/A 05/10/2021   Procedure: KYPHOPLASTY, L2, L3, L4;  Surgeon: Hessie Knows, MD;  Location: [facility];  Service: Orthopedics;  Laterality: N/A;   KYPHOPLASTY N/A 06/25/2021   Procedure: T10, T12, L1 KYPHOPLASTY;  Surgeon: Hessie Knows, MD;  Location: [facility];  Service: Orthopedics;  Laterality: N/A;   PARS PLANA VITRECTOMY Right 05/20/2015   Procedure: PARS PLANA VITRECTOMY WITH 25 GAUGE, laser;  Surgeon: Milus Height, MD;  Location: [facility];  Service: Ophthalmology;  Laterality: Right;   PARTIAL HYSTERECTOMY  1990   TEE WITHOUT CARDIOVERSION N/A 07/27/2020   Procedure: TRANSESOPHAGEAL ECHOCARDIOGRAM (TEE);  Surgeon: Teodoro Spray, MD;  Location: [facility];  Service: Cardiovascular;  Laterality: N/A;   TUBAL LIGATION     WISDOM TOOTH EXTRACTION       Social History   reports that she has never smoked. She has never used smokeless tobacco. She reports that she does not drink alcohol and does not use drugs.   Family History   Her family history includes Allergies in her brother and father; Asthma in her brother and father; Breast cancer in her maternal grandmother; Colon cancer in her father.   Allergies Allergies  Allergen Reactions   Corn-Containing Products     Upset stomach    Nitrofurantoin Nausea Only   Plaquenil [Hydroxychloroquine]     Unknown reaction    Tramadol Nausea And Vomiting   Sulfa Antibiotics Rash    As an infant     Home Medications  Prior to Admission medications   Medication Sig Start Date End Date Taking? Authorizing Provider  acetaminophen (TYLENOL) 500 MG tablet Take 500 mg by mouth  every 6 (six) hours as needed for moderate pain or headache.   Yes [provider]  albuterol (PROVENTIL) (2.5 MG/3ML) 0.083% nebulizer solution Inhale 2.5 mg into the lungs every 6 (six) hours as needed for wheezing or shortness of breath. 01/04/21 01/04/22 Yes [provider]  budesonide-formoterol (SYMBICORT) 80-4.5 MCG/ACT inhaler Take 2 puffs first thing in am and then another 2 puffs about 12 hours later. Patient taking differently: Inhale 2 puffs into the lungs in the morning and at bedtime. 08/13/20  Yes Angiulli, Lavon Paganini, PA-C  cefdinir (OMNICEF) 300 MG capsule Take 1  capsule (300 mg total) by mouth 2 (two) times daily for 10 days. 10/13/21 10/23/21 Yes Glean Hess, MD  dorzolamide (TRUSOPT) 2 % ophthalmic solution Place 1 drop into both eyes 2 (two) times daily.   Yes [provider]  escitalopram (LEXAPRO) 20 MG tablet TAKE 1 TABLET BY MOUTH EVERY DAY 08/18/21  Yes Glean Hess, MD  folic acid (FOLVITE) 1 MG tablet Take 1 tablet (1 mg total) by mouth daily. 08/13/20  Yes Angiulli, Lavon Paganini, PA-C  HYDROcodone bit-homatropine (HYCODAN) 5-1.5 MG/5ML syrup Take 5 mLs by mouth every 6 (six) hours as needed for up to 10 days for cough. 10/13/21 10/23/21 Yes Glean Hess, MD  latanoprost (XALATAN) 0.005 % ophthalmic solution Place 1 drop into the right eye 2 (two) times daily.   Yes [provider]  levothyroxine (SYNTHROID) 137 MCG tablet TAKE 1 TABLET (137 MCG TOTAL) BY MOUTH DAILY BEFORE BREAKFAST. Patient taking differently: Take 137 mcg by mouth daily before breakfast. 12/08/20  Yes Glean Hess, MD  Menthol (ICY HOT) 5 % [facility] Apply 1 patch topically daily as needed (pain).   Yes [provider]  methotrexate (RHEUMATREX) 2.5 MG tablet Take 10 tablets (25 mg total) by mouth every Tuesday. 02/23/21  Yes Elgergawy, Silver Huguenin, MD  Multiple Vitamins-Minerals (MULTIVITAMIN WITH MINERALS) tablet Take 1 tablet by mouth daily.   Yes [provider]  naproxen sodium (ALEVE) 220 MG tablet Take 220 mg by mouth daily as needed (pain).   Yes [provider]  potassium citrate (UROCIT-K) 10 MEQ (1080 MG) SR tablet Take 20 mEq by mouth 2 (two) times daily. 05/31/21  Yes [provider]  predniSONE (DELTASONE) 10 MG tablet Take 19 mg by mouth daily.   Yes [provider]  Probiotic Product (PROBIOTIC PO) Take 1 capsule by mouth daily.   Yes [provider]  Treprostinil (TYVASO) 0.6 MG/ML SOLN Inhale into the lungs See admin instructions. 12 breaths 4 times a day   Yes [provider]  cyclobenzaprine (FLEXERIL) 5 MG tablet Take 5-10 mg by mouth 3 (three) times daily as needed. 07/09/21   [provider]  denosumab (PROLIA) 60 MG/ML SOSY injection Inject 60 mg into the skin every 6 (six) months.    [provider]  HYDROcodone-acetaminophen (NORCO) 5-325 MG tablet Take 1 tablet by mouth every 6 (six) hours as needed for moderate pain. Patient not taking: Reported on 10/19/2021 06/25/21   Hessie Knows, MD  NON FORMULARY CPAP nightly    [provider]  OXYGEN Inhale 2 L into the lungs daily as needed (During walking and activity).     [provider]  Scheduled Meds:  acidophilus  1 capsule Oral Daily   vitamin C  500 mg Oral Daily   benzonatate  200 mg Oral TID   cholecalciferol  1,000 Units Oral Daily   dorzolamide  1 drop Both Eyes BID   enoxaparin (LOVENOX) injection  40 mg Subcutaneous QHS   escitalopram  20 mg Oral Daily   folic acid  1 mg Oral Daily   guaiFENesin  600 mg Oral BID   Ipratropium-Albuterol  1 puff Inhalation Q6H   latanoprost  1 drop Right Eye BID   levothyroxine  137 mcg Oral QAC breakfast   multivitamin with minerals  1 tablet Oral Daily   potassium chloride SA  20 mEq Oral BID   [START ON 10/23/2021] predniSONE  50 mg Oral Daily   Treprostinil  72  mcg Inhalation QID   zinc sulfate  220 mg Oral Daily   Continuous Infusions:   albumin human Stopped (10/21/21 2021)   azithromycin Stopped (10/21/21 1209)   cefTRIAXone (ROCEPHIN)  IV Stopped (10/21/21 2329)   remdesivir 100 mg in NS 100 mL     PRN Meds:.acetaminophen **OR** acetaminophen, albuterol, chlorpheniramine-HYDROcodone, cyclobenzaprine, guaiFENesin-dextromethorphan, magnesium hydroxide, metoprolol tartrate, Muscle Rub, ondansetron **OR** ondansetron (ZOFRAN) IV, traZODone      Assessment & Plan:  Acute on Chronic Hypoxic Respiratory Failure secondary to COVID-19 Pneumonia  AND Asthma exacerbation PMHx: ILD, interstitial lung disease on chronic home O2, sarcoidosis, asthma -Supplemental O2 as needed to maintain O2 saturations 88 to 92% -Follow intermittent ABG and chest x-ray as needed -Continue remdesivir x5 days  -Continue steroids. -Empiric abx coverage given risk factors (ILD+ Chronic renal disease) with Ceftriaxone + Azithromycin pending cultures, may broaden if appropriate -Ensure adequate pulmonary hygiene  -Budesonide inhaler nebs BID, bronchodilators PRNBronchodilators PRN -Encourage OOB, IS, FV, and awake proning if able -Continue airborne, contact precautions for 21 days from positive testing. -Monitor CMP and inflammatory markers -Heparin prophylactic dose.   Sepsis due to osteomyelitis of left toe  -Supplemental oxygen as needed, to maintain SpO2 > 90% -F/u cultures, trend lactic/ PCT -Monitor WBC/ fever curve -Gentle IVF hydration as needed -Repeat blood cultures until clear; follow up trach aspirate -PRN Pressors for MAP goal >65 -Strict I/O's -Podiatry following with no recommendations at this time. Will continue to follow cultures as above    AKI on CKD Stage III -Monitor I&O's / urinary output -Follow BMP -Ensure adequate renal perfusion -Avoid nephrotoxic agents as able -Replace electrolytes as indicated   Hypothyroidism -Continue Synthroid  Best practice:  Diet:  Oral Pain/Anxiety/Delirium protocol (if indicated):  Yes (RASS goal 0) VAP protocol (if indicated): Not indicated DVT prophylaxis: LMWH GI prophylaxis: PPI Glucose control:  SSI Yes Central venous access:  N/A Arterial line:  N/A Foley:  no Mobility:  OOB  PT consulted: Yes Last date of multidisciplinary goals of care discussion [full code] Code Status:  DNR Disposition: SDU   = Goals of Care = Code Status Order: _0 @   Primary Emergency Contact: [place] Wishes to pursue full aggressive treatment and intervention options, including CPR and intubation, but goals of care will be addressed on going with family if that should become necessary.  Critical care provider statement:   Total critical care time: 33 minutes   Performed by: Lanney Gins MD   Critical care time was exclusive of separately billable procedures and treating other patients.   Critical care was necessary to treat or prevent imminent or life-threatening deterioration.   Critical care was time spent personally by me on the following activities: development of treatment plan with patient and/or surrogate as well as nursing, discussions with consultants, evaluation of patient's response to treatment, examination of patient, obtaining history from patient or surrogate, ordering and performing treatments and interventions, ordering and review of laboratory studies, ordering and review of radiographic studies, pulse oximetry and re-evaluation of patient's condition.    Ottie Glazier, M.D.  Pulmonary & Critical Care Medicine     .

## 2021-10-22 NOTE — ED Notes (Signed)
Pt given ginger ale per RN.

## 2021-10-22 NOTE — ED Notes (Signed)
Pt's daughter to bedside

## 2021-10-22 NOTE — ED Notes (Addendum)
This RN to bedside for pt. O2 sat of 85%. Pt. Is having coughing fit, and had HFNC perched up on her head, above her forehead. This RN helped pt. Replace HFNC, and reminded her of importance of keeping oxygen on. Will continue to monitor. Call light and personal items in reach.

## 2021-10-22 NOTE — ED Notes (Signed)
OT at bedside. 

## 2021-10-22 NOTE — Progress Notes (Signed)
PROGRESS NOTE  Stacie [name] BVQ:[id number] DOB: December 30, 1955 DOA: 10/19/2021 PCP: Glean Hess, MD  HPI/Recap of past 24 hours: Stacie [name] is a 66 y.o. Caucasian female with medical history significant for GERD, hypothyroidism, sarcoidosis, sleep apnea, CVA, asthma, depression, and urolithiasis, who presented to [facility] ER with acute onset of worsening dyspnea.  Associated with a productive cough of yellow sputum, wheezing, worsening over the last week.  Work-up in the ED revealed COVID-19 viral infection, acute hypoxic respiratory failure requiring BiPAP.  She was started on IV antiviral Remdesivir, IV antibiotics empirically, bronchodilators and IV steroids.  To note first dose of remdesivir was given on 10/21/2021.  Home O2 evaluation planned for 10/22/21.  10/22/2021: Patient was seen and examined at bedside in the ED.  She denies having any chest pain.  Endorses persistent productive cough.  Tessalon Perles added 200 mg 3 times daily x3 days.  Assessment/Plan: Principal Problem:   ARF (acute respiratory failure) (HCC)  Persistent acute hypoxic respiratory failure, multifactorial, secondary to COVID-19 viral infection, asthma exacerbation.   Not on oxygen supplementation at baseline Personally reviewed chest x-ray done on admission which shows bilateral pulmonary infiltrates left greater than right. COVID-19 screening test positive on 10/19/2021.   She was started on IV antiviral Remdesivir, IV antibiotics Rocephin and IV azithromycin empirically, bronchodilators and IV steroids, continue. IV antiviral, remdesivir started on 10/21/2021. Switched to prednisone on 10/22/2021. Continue vitamin C and zinc. Continue pulmonary toilet BiPAP nightly, heated high flow nasal cannula during the day for breaks and for feedings. Continue to wean off oxygen supplementation as tolerated Incentive spirometer, flutter valve Mobilize as tolerated Antitussives with Tessalon Perles 3 times  daily Follow-up sputum culture and blood cultures. Home oxygen evaluation on 10/22/2021 for DC planning.  COVID-19 viral infection, POA Management as stated above. 10-days of isolation from positive COVID-19 screening test.  Asthma exacerbation secondary to above. Continue steroids, bronchodilators, ambulate only. Continue to maintain O2 saturation greater than 90%.  Left forefoot ulceration post first and second partial ray resections, POA Seen by podiatry, recommended local wound care, signed off 10/20/2021. Left foot x-ray was obtained. Patient had an MRI of the left foot on 05/08/2021. MRSA screen is pending She was seen by podiatry, recommended conservative management with local wound care.  Leukopenia with concern for sepsis secondary to left great toe infection. WBC 2.3, pulse 112, respiratory 21. Continue to follow blood cultures, negative to date. Continue to monitor fever curve and WBC. Repeat CBC in the a.m.  Rheumatoid arthritis. Continue to hold off methotrexate for now Continue steroids  Hypothyroidism. Continue home Synthroid.  Depression. Continue home Lexapro  Physical debility PT OT assessment Fall precautions.   Critical care time: 65 minutes.     DVT prophylaxis: Lovenox subcu daily. Code Status: full code. Family Communication: Daughter at bedside.    Disposition Plan: Back to previous home environment Consults called: PCCM. All the records are reviewed and case discussed with ED provider.   Status is: Inpatient  Patient requires at least 2 midnights for further evaluation and treatment of pain condition.      Objective: Vitals:   10/22/21 1030 10/22/21 1045 10/22/21 1100 10/22/21 1115  BP:   126/76   Pulse: 98 98 93 95  Resp:      Temp:      TempSrc:      SpO2: 99% 99% 100% 99%    Intake/Output Summary (Last 24 hours) at 10/22/2021 1342 Last data filed at 10/22/2021 1219 Gross  per 24 hour  Intake 583.13 ml  Output 1100 ml  Net  -516.87 ml   There were no vitals filed for this visit.  Exam:  General: 66 y.o. year-old female well-developed well-nourished in no acute distress.  She is alert oriented x3.   Cardiovascular: Regular rate and rhythm no rubs or gallops.  No JVD noted.   Respiratory: Mild rales and mild wheezing bilaterally.  Good inspiratory effort.   Abdomen: Soft nontender normal bowel sounds.   Musculoskeletal: No lower extremity edema bilaterally Skin: Left forefoot ulcerative lesion Psychiatry: Mood is appropriate for condition and setting. Neuro: Moves all 4 extremities.  Nonfocal exam.   Data Reviewed: CBC: Recent Labs  Lab 10/19/21 2024 10/20/21 0806 10/21/21 1621  WBC 5.0 2.3* 2.2*  NEUTROABS  --   --  2.1  HGB 11.6* 10.2* 10.4*  HCT 37.2 33.0* 33.1*  MCV 96.9 95.9 94.3  PLT 385 356 161*   Basic Metabolic Panel: Recent Labs  Lab 10/19/21 2024 10/20/21 0806 10/21/21 0720 10/22/21 1023  NA 139 138 144 141  K 4.0 4.3 4.6 4.2  CL 99 98 98 95*  CO2 29 29 36* 36*  GLUCOSE 257* 199* 151* 195*  BUN 29* 31* 39* 44*  CREATININE 1.10* 1.05* 0.99 0.99  CALCIUM 8.9 8.2* 8.5* 8.5*  MG  --   --  2.3  --   PHOS  --   --  3.1  --    GFR: Estimated Creatinine Clearance: 60.4 mL/min (by C-G formula based on SCr of 0.99 mg/dL). Liver Function Tests: Recent Labs  Lab 10/21/21 0720  AST 28  ALT 49*  ALKPHOS 71  BILITOT 0.6  PROT 6.9  ALBUMIN 2.5*   No results for input(s): LIPASE, AMYLASE in the last 168 hours. No results for input(s): AMMONIA in the last 168 hours. Coagulation Profile: No results for input(s): INR, PROTIME in the last 168 hours. Cardiac Enzymes: No results for input(s): CKTOTAL, CKMB, CKMBINDEX, TROPONINI in the last 168 hours. BNP (last 3 results) No results for input(s): PROBNP in the last 8760 hours. HbA1C: No results for input(s): HGBA1C in the last 72 hours. CBG: No results for input(s): GLUCAP in the last 168 hours. Lipid Profile: No results for  input(s): CHOL, HDL, LDLCALC, TRIG, CHOLHDL, LDLDIRECT in the last 72 hours. Thyroid Function Tests: No results for input(s): TSH, T4TOTAL, FREET4, T3FREE, THYROIDAB in the last 72 hours. Anemia Panel: Recent Labs    10/21/21 0709 10/22/21 1023  FERRITIN 502* 345*   Urine analysis:    Component Value Date/Time   COLORURINE STRAW (A) 05/06/2021 1616   APPEARANCEUR CLEAR (A) 05/06/2021 1616   APPEARANCEUR Clear 04/21/2021 1551   LABSPEC 1.010 05/06/2021 1616   PHURINE 8.0 05/06/2021 1616   GLUCOSEU NEGATIVE 05/06/2021 1616   HGBUR SMALL (A) 05/06/2021 1616   BILIRUBINUR NEGATIVE 05/06/2021 1616   BILIRUBINUR Negative 04/21/2021 1551   KETONESUR NEGATIVE 05/06/2021 1616   PROTEINUR NEGATIVE 05/06/2021 1616   UROBILINOGEN 0.2 01/07/2020 1406   UROBILINOGEN 0.2 11/25/2014 1849   NITRITE NEGATIVE 05/06/2021 1616   LEUKOCYTESUR NEGATIVE 05/06/2021 1616   Sepsis Labs: _0 (procalcitonin:4,lacticidven:4)  ) Recent Results (from the past 240 hour(s))  Resp Panel by RT-PCR (Flu A&B, Covid) Nasopharyngeal Swab     Status: Abnormal   Collection Time: 10/19/21  8:24 PM   Specimen: Nasopharyngeal Swab; Nasopharyngeal(NP) swabs in vial transport medium  Result Value Ref Range Status   SARS Coronavirus 2 by RT PCR POSITIVE (A) NEGATIVE Final  Comment: (NOTE) SARS-CoV-2 target nucleic acids are DETECTED.  The SARS-CoV-2 RNA is generally detectable in upper respiratory specimens during the acute phase of infection. Positive results are indicative of the presence of the identified virus, but do not rule out bacterial infection or co-infection with other pathogens not detected by the test. Clinical correlation with patient history and other diagnostic information is necessary to determine patient infection status. The expected result is Negative.  Fact Sheet for Patients: EntrepreneurPulse.com.au  Fact Sheet for Healthcare  Providers: IncredibleEmployment.be  This test is not yet approved or cleared by the [place] FDA and  has been authorized for detection and/or diagnosis of SARS-CoV-2 by FDA under an Emergency Use Authorization (EUA).  This EUA will remain in effect (meaning this test can be used) for the duration of  the COVID-19 declaration under Section 564(b)(1) of the A ct, 21 U.S.C. section 360bbb-3(b)(1), unless the authorization is terminated or revoked sooner.     Influenza A by PCR NEGATIVE NEGATIVE Final   Influenza B by PCR NEGATIVE NEGATIVE Final    Comment: (NOTE) The Xpert Xpress SARS-CoV-2/FLU/RSV plus assay is intended as an aid in the diagnosis of influenza from Nasopharyngeal swab specimens and should not be used as a sole basis for treatment. Nasal washings and aspirates are unacceptable for Xpert Xpress SARS-CoV-2/FLU/RSV testing.  Fact Sheet for Patients: EntrepreneurPulse.com.au  Fact Sheet for Healthcare Providers: IncredibleEmployment.be  This test is not yet approved or cleared by the [place] FDA and has been authorized for detection and/or diagnosis of SARS-CoV-2 by FDA under an Emergency Use Authorization (EUA). This EUA will remain in effect (meaning this test can be used) for the duration of the COVID-19 declaration under Section 564(b)(1) of the Act, 21 U.S.C. section 360bbb-3(b)(1), unless the authorization is terminated or revoked.  Performed at [facility], [address] [place], [place] [address]   Culture, blood (Routine X 2) w Reflex to ID Panel     Status: None (Preliminary result)   Collection Time: 10/21/21  7:21 AM   Specimen: Right Antecubital  Result Value Ref Range Status   Specimen Description RIGHT ANTECUBITAL  Final   Special Requests   Final    BOTTLES DRAWN AEROBIC AND ANAEROBIC Blood Culture adequate volume   Culture   Final    NO GROWTH < 24 HOURS Performed at  [facility], [address] [place], [place] [address]    Report Status PENDING  Incomplete  Culture, blood (Routine X 2) w Reflex to ID Panel     Status: None (Preliminary result)   Collection Time: 10/21/21  7:23 AM   Specimen: BLOOD RIGHT ARM  Result Value Ref Range Status   Specimen Description BLOOD RIGHT ARM  Final   Special Requests   Final    BOTTLES DRAWN AEROBIC ONLY Blood Culture adequate volume   Culture   Final    NO GROWTH < 24 HOURS Performed at [facility], [address], [place], [place] [address]    Report Status PENDING  Incomplete      Studies: No results found.  Scheduled Meds:  acidophilus  1 capsule Oral Daily   vitamin C  500 mg Oral Daily   benzonatate  200 mg Oral TID   cholecalciferol  1,000 Units Oral Daily   dorzolamide  1 drop Both Eyes BID   enoxaparin (LOVENOX) injection  40 mg Subcutaneous QHS   escitalopram  20 mg Oral Daily   folic acid  1  mg Oral Daily   guaiFENesin  600 mg Oral BID   Ipratropium-Albuterol  1 puff Inhalation Q6H   latanoprost  1 drop Right Eye BID   levothyroxine  137 mcg Oral QAC breakfast   lidocaine  1 patch Transdermal Q24H   methylPREDNISolone sodium succinate       multivitamin with minerals  1 tablet Oral Daily   potassium chloride SA  20 mEq Oral BID   [START ON 10/23/2021] predniSONE  50 mg Oral Daily   Treprostinil  72 mcg Inhalation QID   zinc sulfate  220 mg Oral Daily    Continuous Infusions:  albumin human 12.5 g (10/22/21 1241)   azithromycin Stopped (10/22/21 1219)   cefTRIAXone (ROCEPHIN)  IV Stopped (10/21/21 2329)   remdesivir 100 mg in NS 100 mL       LOS: 3 days     Kayleen Memos, MD [facility] Pager [phone]  If 7PM-7AM, please contact night-coverage [email or web address] Password [email or web address] 10/22/2021, 1:42 PM

## 2021-10-22 NOTE — Evaluation (Signed)
Occupational Therapy Evaluation Patient Details Name: Stacie [name] MRN: [id number] DOB: 1956-05-02 Today's Date: 10/22/2021   History of Present Illness Pt is a 66 y/o F admitted on 10/19/21 after presenting to the ED with acute onset of worsening dyspnea. Pt found to be covid (+). Pt is being treated for acute hypoxic respiratory failure 2/2 covid 19 infection & asthma exacerbation. PMH: GERD, hypothyroidism, sarcoidosis, sleep apnea, CVA, asthma, depression, urolithiasis   Clinical Impression   Pt was seen for OT evaluation and co-tx with PT this date. Pt with family present throughout session with pt pleasant & agreeable. Pt on 40L/min via [place] throughout. Pt is able to complete bed mobility without physical assistance but requires CGA<>min assist for transfers & minimal gait in room without AD. +2 for safety/lines/leads mgt.  Prior to hospital admission, pt was not using AD for mobility and was only recently requiring minimal assist for basic ADL 2/2 feeling unwell and more fatigued. Currently pt demonstrates impairments as described below (See OT problem list) which functionally limit her ability to perform ADL/self-care tasks. Pt currently requires MIN A For LB ADL, CGA-MIN A for ADL transfers, and increased rest breaks to support breath recovery after exertion. Pt/family educated in pursed lip breathing to support breath recovery. Pt would benefit from skilled OT services to address noted impairments and functional limitations (see below for any additional details) in order to maximize safety and independence while minimizing falls risk and caregiver burden. Upon hospital discharge, recommend HHOT to maximize pt safety and return to functional independence during meaningful occupations of daily life.    Recommendations for follow up therapy are one component of a multi-disciplinary discharge planning process, led by the attending physician.  Recommendations may be updated based on patient status,  additional functional criteria and insurance authorization.   Follow Up Recommendations  Home health OT    Assistance Recommended at Discharge Intermittent Supervision/Assistance  Patient can return home with the following A little help with walking and/or transfers;A little help with bathing/dressing/bathroom;Assistance with cooking/housework;Assist for transportation;Help with stairs or ramp for entrance    Functional Status Assessment  Patient has had a recent decline in their functional status and demonstrates the ability to make significant improvements in function in a reasonable and predictable amount of time.  Equipment Recommendations  None recommended by OT    Recommendations for Other Services       Precautions / Restrictions Precautions Precautions: Fall Restrictions Weight Bearing Restrictions: No      Mobility Bed Mobility Overal bed mobility: Needs Assistance Bed Mobility: Supine to Sit     Supine to sit: Supervision;HOB elevated          Transfers Overall transfer level: Needs assistance Equipment used: None Transfers: Sit to/from Stand;Bed to chair/wheelchair/BSC Sit to Stand: Min guard Stand pivot transfers: Min assist;+2 safety/equipment         General transfer comment: + 2 for lines/leads, MIN A x1 to perform      Balance Overall balance assessment: Needs assistance Sitting-balance support: Feet supported;No upper extremity supported Sitting balance-Leahy Scale: Good     Standing balance support: No upper extremity supported Standing balance-Leahy Scale: Fair Standing balance comment: static standing with CGA without AD                           ADL either performed or assessed with clinical judgement   ADL  General ADL Comments: Pt required MIN A For donning new brief incorporating ADL transfer, CGA-MIN A for ADL transfers, increased need for rest breaks to support  breath recovery.     Vision         Perception     Praxis      Pertinent Vitals/Pain Pain Assessment: No/denies pain     Hand Dominance Right   Extremity/Trunk Assessment Upper Extremity Assessment Upper Extremity Assessment: Overall WFL for tasks assessed   Lower Extremity Assessment Lower Extremity Assessment: Generalized weakness   Cervical / Trunk Assessment Cervical / Trunk Assessment: Normal   Communication Communication Communication: No difficulties   Cognition Arousal/Alertness: Awake/alert Behavior During Therapy: WFL for tasks assessed/performed Overall Cognitive Status: Within Functional Limits for tasks assessed                                       General Comments  PT/OT assists with changing to a clean brief as pt with urinary incontinence (at baseline). Encouraged pt to sit up as much as possible, PT educated pt on use of acapella flutter valve. Pt on 40L/min via [place] with lowest SpO2 86% but pt quickly recovers to >/= 90%.    Exercises     Shoulder Instructions      Home Living Family/patient expects to be discharged to:: Private residence Living Arrangements: Spouse/significant other Available Help at Discharge: Family;Available PRN/intermittently Type of Home: House Home Access: Stairs to enter [employer] of Steps: 2-3; back door primary 2 steps no rail Entrance Stairs-Rails: Right;Left;Can reach both Home Layout: Two level;1/2 bath on main level Alternate Level Stairs-Number of Steps: 15 Alternate Level Stairs-Rails: Left Bathroom Shower/Tub: Teacher, early years/pre: Handicapped height (in primary bathroom)     Home Equipment: Tub bench   Additional Comments: toilet rails in 2nd fl bathroom      Prior Functioning/Environment Prior Level of Function : Needs assist             Mobility Comments: Pt was independent with gait without AD, did require frequent rest breaks 2/2 SOB, on 2L/min  chronic O2 at home. ADLs Comments: Mod indep with ADL, seated shower, recently requiring minimal assist for ADL and increased assist for IADL        OT Problem List: Cardiopulmonary status limiting activity;Decreased strength;Decreased activity tolerance;Impaired balance (sitting and/or standing);Decreased knowledge of use of DME or AE      OT Treatment/Interventions: Self-care/ADL training;Therapeutic exercise;Therapeutic activities;Energy conservation;DME and/or AE instruction;Patient/family education;Balance training    OT Goals(Current goals can be found in the care plan section) Acute Rehab OT Goals Patient Stated Goal: breathe/feel better and go home with family OT Goal Formulation: With patient/family Time For Goal Achievement: 11/05/21 Potential to Achieve Goals: Good ADL Goals Pt Will Transfer to Toilet: with modified independence;ambulating (elevated commode, LRAD PRN) Additional ADL Goal #1: Pt will perform UB/LB dressing with modified independence incorporating ADL transfers using learned ECS strategies to support safety/indep, 3/3 opportunities. Additional ADL Goal #2: Pt will verbalize plan to implement at least 2 learned ECS into daily routines to improve safety/indep with ADL/IADL participation.  OT Frequency: Min 2X/week    Co-evaluation PT/OT/SLP Co-Evaluation/Treatment: Yes Reason for Co-Treatment: Complexity of the patient's impairments (multi-system involvement);To address functional/ADL transfers PT goals addressed during session: Balance;Mobility/safety with mobility OT goals addressed during session: ADL's and self-care      AM-PAC OT "6 Clicks" Daily Activity  Outcome Measure Help from another person eating meals?: None Help from another person taking care of personal grooming?: None Help from another person toileting, which includes using toliet, bedpan, or urinal?: A Little Help from another person bathing (including washing, rinsing, drying)?: A  Little Help from another person to put on and taking off regular upper body clothing?: None Help from another person to put on and taking off regular lower body clothing?: A Little 6 Click Score: 21   End of Session Equipment Utilized During Treatment: Gait belt;Oxygen  Activity Tolerance: Patient tolerated treatment well Patient left: in chair;with call bell/phone within reach;with family/visitor present;with nursing/sitter in room  OT Visit Diagnosis: Other abnormalities of gait and mobility (R26.89);Muscle weakness (generalized) (M62.81)                Time: 0352-4818 OT Time Calculation (min): 32 min Charges:  OT General Charges $OT Visit: 1 Visit OT Evaluation $OT Eval Moderate Complexity: 1 Mod  Ardeth Perfect., MPH, MS, OTR/L ascom [phone] 10/22/21, 3:50 PM

## 2021-10-22 NOTE — Evaluation (Signed)
Physical Therapy Evaluation Patient Details Name: Stacie [name] MRN: [id number] DOB: [birth year] Today's Date: 10/22/2021  History of Present Illness  Pt is a 66 y/o F admitted on 10/19/21 after presenting to the ED with acute onset of worsening dyspnea. Pt found to be covid (+). Pt is being treated for acute hypoxic respiratory failure 2/2 covid 19 infection & asthma exacerbation. PMH: GERD, hypothyroidism, sarcoidosis, sleep apnea, CVA, asthma, depression, urolithiasis  Clinical Impression  Pt seen for PT evaluation with co-tx with OT. Pt with family present throughout session with pt pleasant & agreeable. Pt on 40L/min via [place] throughout. Pt is able to complete bed mobility without physical assistance but requires CGA<>min assist for transfers & minimal gait in room without AD. Pt does tolerate standing while PT/OT assists with changing brief. Pt does fatigue rather quickly & require seated rest breaks. Encouraged pt to sit up as much as possible while in acute setting vs lying in bed. Will continue to follow pt acutely to address endurance, balance, & gait with LRAD.        Recommendations for follow up therapy are one component of a multi-disciplinary discharge planning process, led by the attending physician.  Recommendations may be updated based on patient status, additional functional criteria and insurance authorization.  Follow Up Recommendations Home health PT    Assistance Recommended at Discharge Frequent or constant Supervision/Assistance  Patient can return home with the following  A little help with walking and/or transfers;A little help with bathing/dressing/bathroom;Assistance with cooking/housework;Assist for transportation;Help with stairs or ramp for entrance    Equipment Recommendations None recommended by PT  Recommendations for Other Services       Functional Status Assessment Patient has had a recent decline in their functional status and demonstrates the ability to  make significant improvements in function in a reasonable and predictable amount of time.     Precautions / Restrictions Precautions Precautions: Fall Restrictions Weight Bearing Restrictions: No      Mobility  Bed Mobility Overal bed mobility: Needs Assistance Bed Mobility: Supine to Sit     Supine to sit: Supervision;HOB elevated          Transfers Overall transfer level: Needs assistance Equipment used: None Transfers: Sit to/from Stand;Bed to chair/wheelchair/BSC Sit to Stand: Min guard Stand pivot transfers: Min assist              Ambulation/Gait Ambulation/Gait assistance: Min assist Gait Distance (Feet):  (3 ft forwards & backwards) Assistive device: None Gait Pattern/deviations: Decreased step length - left;Decreased step length - right;Decreased stride length Gait velocity: decreased        Stairs            Wheelchair Mobility    Modified Rankin (Stroke Patients Only)       Balance Overall balance assessment: Needs assistance Sitting-balance support: Feet supported;No upper extremity supported Sitting balance-Leahy Scale: Good     Standing balance support: No upper extremity supported Standing balance-Leahy Scale: Fair Standing balance comment: static standing with CGA without AD                             Pertinent Vitals/Pain Pain Assessment: No/denies pain    Home Living Family/patient expects to be discharged to:: Private residence Living Arrangements: Spouse/significant other Available Help at Discharge: Family;Available 24 hours/day Type of Home: House Home Access: Stairs to enter Entrance Stairs-Rails: Right;Left;Can reach both Entrance Stairs-Number of Steps: 2-3; back door primary 2 steps  no rail Alternate Level Stairs-Number of Steps: 15 Home Layout: Two level;1/2 bath on main level Home Equipment: Tub bench Additional Comments: toilet rails in 2nd fl bathroom    Prior Function Prior Level of  Function : Needs assist             Mobility Comments: Pt was independent with gait without AD, did require frequent rest breaks 2/2 SOB, on 2L/min chronic O2 at home.       Hand Dominance        Extremity/Trunk Assessment   Upper Extremity Assessment Upper Extremity Assessment: Overall WFL for tasks assessed    Lower Extremity Assessment Lower Extremity Assessment: Generalized weakness    Cervical / Trunk Assessment Cervical / Trunk Assessment: Normal  Communication   Communication: No difficulties  Cognition Arousal/Alertness: Awake/alert Behavior During Therapy: WFL for tasks assessed/performed Overall Cognitive Status: Within Functional Limits for tasks assessed                                          General Comments General comments (skin integrity, edema, etc.): PT/OT assists with changing to a clean brief as pt with urinary incontinence (at baseline). Encouraged pt to sit up as much as possible, educated pt on use of acapella flutter valve. Pt on 40L/min via [place] with lowest SPO2 86% but pt quickly recovers to >/= 90%.    Exercises     Assessment/Plan    PT Assessment Patient needs continued PT services  PT Problem List Decreased strength;Cardiopulmonary status limiting activity;Decreased activity tolerance;Decreased balance;Decreased mobility       PT Treatment Interventions DME instruction;Therapeutic exercise;Gait training;Balance training;Stair training;Functional mobility training;Therapeutic activities;Patient/family education;Neuromuscular re-education    PT Goals (Current goals can be found in the Care Plan section)  Acute Rehab PT Goals Patient Stated Goal: get better PT Goal Formulation: With patient/family Time For Goal Achievement: 11/05/21 Potential to Achieve Goals: Good    Frequency Min 2X/week     Co-evaluation PT/OT/SLP Co-Evaluation/Treatment: Yes Reason for Co-Treatment: Complexity of the patient's  impairments (multi-system involvement);To address functional/ADL transfers PT goals addressed during session: Balance;Mobility/safety with mobility         AM-PAC PT "6 Clicks" Mobility  Outcome Measure Help needed turning from your back to your side while in a flat bed without using bedrails?: None Help needed moving from lying on your back to sitting on the side of a flat bed without using bedrails?: A Little Help needed moving to and from a bed to a chair (including a wheelchair)?: A Little Help needed standing up from a chair using your arms (e.g., wheelchair or bedside chair)?: A Little Help needed to walk in hospital room?: A Little Help needed climbing 3-5 steps with a railing? : A Lot 6 Click Score: 18    End of Session Equipment Utilized During Treatment: Gait belt;Oxygen Activity Tolerance: Patient tolerated treatment well;Patient limited by fatigue Patient left: in chair;with family/visitor present Nurse Communication: Mobility status PT Visit Diagnosis: Muscle weakness (generalized) (M62.81);Unsteadiness on feet (R26.81)    Time: 3007-6226 PT Time Calculation (min) (ACUTE ONLY): 30 min   Charges:   PT Evaluation $PT Eval Low Complexity: 1 Low PT Treatments $Therapeutic Activity: 8-22 mins        Lavone Nian, PT, DPT 10/22/21, 3:08 PM   Waunita Schooner 10/22/2021, 3:07 PM

## 2021-10-22 NOTE — Progress Notes (Signed)
Pt did not want to be placed on Bipap. Pt wants to stay on HFNC, pt is tol well at this time. Bipap remains at bedside.

## 2021-10-23 ENCOUNTER — Inpatient Hospital Stay: Payer: BC Managed Care – PPO

## 2021-10-23 DIAGNOSIS — J9600 Acute respiratory failure, unspecified whether with hypoxia or hypercapnia: Secondary | ICD-10-CM

## 2021-10-23 DIAGNOSIS — J9601 Acute respiratory failure with hypoxia: Secondary | ICD-10-CM | POA: Diagnosis not present

## 2021-10-23 HISTORY — DX: Acute respiratory failure, unspecified whether with hypoxia or hypercapnia: J96.00

## 2021-10-23 LAB — COMPREHENSIVE METABOLIC PANEL
ALT: 30 U/L (ref 0–44)
AST: 19 U/L (ref 15–41)
Albumin: 2.8 g/dL — ABNORMAL LOW (ref 3.5–5.0)
Alkaline Phosphatase: 54 U/L (ref 38–126)
Anion gap: 7 (ref 5–15)
BUN: 40 mg/dL — ABNORMAL HIGH (ref 8–23)
CO2: 34 mmol/L — ABNORMAL HIGH (ref 22–32)
Calcium: 8.5 mg/dL — ABNORMAL LOW (ref 8.9–10.3)
Chloride: 98 mmol/L (ref 98–111)
Creatinine, Ser: 0.87 mg/dL (ref 0.44–1.00)
GFR, Estimated: >60 mL/min (ref >60)
Glucose, Bld: 139 mg/dL — ABNORMAL HIGH (ref 70–99)
Potassium: 4.4 mmol/L (ref 3.5–5.1)
Sodium: 139 mmol/L (ref 135–145)
Total Bilirubin: 0.6 mg/dL (ref 0.3–1.2)
Total Protein: 6.4 g/dL — ABNORMAL LOW (ref 6.5–8.1)

## 2021-10-23 LAB — FERRITIN: Ferritin: 284 ng/mL (ref 11–307)

## 2021-10-23 LAB — MAGNESIUM: Magnesium: 2.2 mg/dL (ref 1.7–2.4)

## 2021-10-23 LAB — CBC
HCT: 35.2 % — ABNORMAL LOW (ref 36.0–46.0)
Hemoglobin: 10.9 g/dL — ABNORMAL LOW (ref 12.0–15.0)
MCH: 29 pg (ref 26.0–34.0)
MCHC: 31 g/dL (ref 30.0–36.0)
MCV: 93.6 fL (ref 80.0–100.0)
Platelets: 499 10*3/uL — ABNORMAL HIGH (ref 150–400)
RBC: 3.76 MIL/uL — ABNORMAL LOW (ref 3.87–5.11)
RDW: 13.4 % (ref 11.5–15.5)
WBC: 2.1 10*3/uL — ABNORMAL LOW (ref 4.0–10.5)
nRBC: 0 % (ref 0.0–0.2)

## 2021-10-23 LAB — PHOSPHORUS
Phosphorus: 1.8 mg/dL — ABNORMAL LOW (ref 2.5–4.6)
Phosphorus: 3.6 mg/dL (ref 2.5–4.6)

## 2021-10-23 LAB — C-REACTIVE PROTEIN: CRP: 2 mg/dL — ABNORMAL HIGH (ref <1.0)

## 2021-10-23 LAB — D-DIMER, QUANTITATIVE: D-Dimer, Quant: 1.86 ug/mL-FEU — ABNORMAL HIGH (ref 0.00–0.50)

## 2021-10-23 MED ORDER — FUROSEMIDE 10 MG/ML IJ SOLN
20.0000 mg | INTRAMUSCULAR | Status: AC
  Administered 2021-10-23: 20 mg via INTRAVENOUS
  Filled 2021-10-23: qty 4

## 2021-10-23 MED ORDER — ALBUTEROL SULFATE HFA 108 (90 BASE) MCG/ACT IN AERS: 2.0000 | INHALATION_SPRAY | Freq: Four times a day (QID) | RESPIRATORY_TRACT | Status: DC

## 2021-10-23 MED ORDER — K PHOS MONO-SOD PHOS DI & MONO 155-852-130 MG PO TABS
500.0000 mg | ORAL_TABLET | ORAL | Status: AC
  Administered 2021-10-23: 500 mg via ORAL
  Filled 2021-10-23 (×2): qty 2

## 2021-10-23 MED ORDER — MOMETASONE FURO-FORMOTEROL FUM 100-5 MCG/ACT IN AERO
2.0000 | INHALATION_SPRAY | Freq: Two times a day (BID) | RESPIRATORY_TRACT | Status: DC
  Administered 2021-10-23 – 2021-10-26 (×7): 2 via RESPIRATORY_TRACT
  Filled 2021-10-23: qty 8.8

## 2021-10-23 MED ORDER — BENZONATATE 100 MG PO CAPS
200.0000 mg | ORAL_CAPSULE | Freq: Three times a day (TID) | ORAL | Status: AC
  Administered 2021-10-23 – 2021-10-25 (×9): 200 mg via ORAL
  Filled 2021-10-23 (×9): qty 2

## 2021-10-23 MED ORDER — POTASSIUM CHLORIDE CRYS ER 20 MEQ PO TBCR
20.0000 meq | EXTENDED_RELEASE_TABLET | Freq: Two times a day (BID) | ORAL | Status: DC
  Administered 2021-10-24 – 2021-10-25 (×3): 20 meq via ORAL
  Filled 2021-10-23 (×5): qty 1

## 2021-10-23 MED ORDER — AZITHROMYCIN 250 MG PO TABS
500.0000 mg | ORAL_TABLET | Freq: Every day | ORAL | Status: AC
  Administered 2021-10-24 – 2021-10-25 (×2): 500 mg via ORAL
  Filled 2021-10-23 (×2): qty 2

## 2021-10-23 MED ORDER — ALBUTEROL SULFATE HFA 108 (90 BASE) MCG/ACT IN AERS
2.0000 | INHALATION_SPRAY | Freq: Four times a day (QID) | RESPIRATORY_TRACT | Status: DC
  Administered 2021-10-23 – 2021-10-26 (×11): 2 via RESPIRATORY_TRACT
  Filled 2021-10-23: qty 6.7

## 2021-10-23 NOTE — ED Notes (Addendum)
RT at bedside at this time, pt decreased O2 delivery to 8L [place], humidified. Pt's daughter assisted pt with Tyvaso, afterwhich pt begins to cough. Per daughter this happens sometimes after this treatment. HR increased 120s but O2 maintained above the 90s.

## 2021-10-23 NOTE — ED Notes (Signed)
This RN at bedside to administer medication, daughter requesting I come back when pt is awake.

## 2021-10-23 NOTE — Progress Notes (Signed)
PROGRESS NOTE  Stacie [name] RPZ:[id number] DOB: 1956/04/08 DOA: 10/19/2021 PCP: Glean Hess, MD  HPI/Recap of past 24 hours: Stacie [name] is a 66 y.o. Caucasian female with medical history significant for GERD, hypothyroidism, sarcoidosis, sleep apnea, CVA, asthma, chronic hypoxia on 2 L nasal cannula continuously, depression, and urolithiasis, who presented to [facility] ER with acute onset of worsening dyspnea.  Associated with a productive cough of yellow sputum, wheezing, worsening over the last week.  Work-up in the ED revealed COVID-19 viral infection, acute hypoxic respiratory failure requiring BiPAP.  She was started on IV antiviral Remdesivir, IV antibiotics empirically, bronchodilators and IV steroids.  To note first dose of remdesivir was given on 10/21/2021.  Home O2 evaluation planned for 10/23/21.  10/23/2021: Seen and examined at her bedside.  Her daughter was present in the room.  She endorses a nonproductive cough.  While in the room he had a bout of cough.  Repeated chest x-ray, personally reviewed showed improvement of pulmonary infiltrates involving both lungs.  We will continue to wean off oxygen supplementation as tolerated.  Assessment/Plan: Principal Problem:   ARF (acute respiratory failure) (HCC)  Persistent acute on chronic hypoxic respiratory failure, multifactorial, secondary to COVID-19 viral infection, asthma exacerbation.   Per her daughter at bedside she is on 2 L nasal cannula at baseline continuously. Personally reviewed chest x-ray done on admission which shows bilateral pulmonary infiltrates left greater than right. COVID-19 screening test positive on 10/19/2021.   She was started on IV antiviral Remdesivir, IV antibiotics Rocephin and IV azithromycin empirically, bronchodilators and IV steroids, continue. IV antiviral, remdesivir started on 10/21/2021. Switched to prednisone on 10/23/2021. Continue vitamin C and zinc. Continue pulmonary toilet BiPAP  nightly, heated high flow nasal cannula during the day for breaks and for feedings. Continue to wean off oxygen supplementation as tolerated Incentive spirometer, flutter valve Continue to mobilize as tolerated Continue antitussives with Tessalon Perles 3 times daily Blood cultures negative to date. Home oxygen evaluation on 10/23/2021 for DC planning.  COVID-19 viral infection, POA Management as stated above. 10-days of isolation from positive COVID-19 screening test.  Asthma exacerbation secondary to above. Continue steroids, bronchodilators, ambulate only. Continue to maintain O2 saturation greater than 90%.  Left forefoot ulceration post first and second partial ray resections, POA Seen by podiatry, recommended local wound care, signed off 10/20/2021. Left foot x-ray was obtained. Patient had an MRI of the left foot on 05/08/2021. MRSA screen is pending She was seen by podiatry, recommended conservative management with local wound care.  Leukopenia with concern for sepsis secondary to left great toe infection. WBC 2.3, pulse 112, respiratory 21. Continue to follow blood cultures, negative to date. Continue to monitor fever curve and WBC. Repeat CBC in the a.m.  Rheumatoid arthritis. Continue to hold off methotrexate for now Continue steroids, on steroids chronically, continue to avoid adrenal insufficiency or crisis.  Hypothyroidism. Continue home Synthroid.  Depression. Continue home Lexapro  Physical debility PT OT assessment Fall precautions.       DVT prophylaxis: Lovenox subcu daily. Code Status: full code. Family Communication: Daughter at bedside.  Disposition Plan: Back to previous home environment Consults called: PCCM. All the records are reviewed and case discussed with ED provider.   Status is: Inpatient  Patient requires at least 2 midnights for further evaluation and treatment of pain condition.      Objective: Vitals:   10/23/21 0915  10/23/21 0955 10/23/21 1102 10/23/21 1200  BP: 138/84  126/83 131/75  Pulse: 88 (!) 105 (!) 108 (!) 104  Resp: _0 (!) 22  Temp:      TempSrc:      SpO2: 100% 100% 97% 100%    Intake/Output Summary (Last 24 hours) at 10/23/2021 1257 Last data filed at 10/23/2021 1056 Gross per 24 hour  Intake 329.34 ml  Output 1250 ml  Net -920.66 ml   There were no vitals filed for this visit.  Exam:  General: 66 y.o. year-old female frail-appearing no acute distress.  She is alert and oriented x3.   Cardiovascular: Regular rate and rhythm no rubs or gallops.  No JVD or thyromegaly noted.   Respiratory: Mild rales at bases.  Mild wheezing noted bilaterally.  Poor inspiratory effort.   Abdomen: Soft nontender normal bowel sounds present.   Musculoskeletal: No lower extremity edema bilaterally. Skin: Left forefoot ulcerative lesion. Psychiatry: Mood is appropriate for condition and setting. Neuro: Nonfocal exam.  Moves all 4 extremities.   Data Reviewed: CBC: Recent Labs  Lab 10/19/21 2024 10/20/21 0806 10/21/21 1621 10/23/21 0742  WBC 5.0 2.3* 2.2* 2.1*  NEUTROABS  --   --  2.1  --   HGB 11.6* 10.2* 10.4* 10.9*  HCT 37.2 33.0* 33.1* 35.2*  MCV 96.9 95.9 94.3 93.6  PLT 385 356 452* 027*   Basic Metabolic Panel: Recent Labs  Lab 10/19/21 2024 10/20/21 0806 10/21/21 0720 10/22/21 1023 10/23/21 0911  NA 139 138 144 141 139  K 4.0 4.3 4.6 4.2 4.4  CL 99 98 98 95* 98  CO2 29 29 36* 36* 34*  GLUCOSE 257* 199* 151* 195* 139*  BUN 29* 31* 39* 44* 40*  CREATININE 1.10* 1.05* 0.99 0.99 0.87  CALCIUM 8.9 8.2* 8.5* 8.5* 8.5*  MG  --   --  2.3  --  2.2  PHOS  --   --  3.1  --  1.8*   GFR: Estimated Creatinine Clearance: 68.8 mL/min (by C-G formula based on SCr of 0.87 mg/dL). Liver Function Tests: Recent Labs  Lab 10/21/21 0720 10/23/21 0911  AST 28 19  ALT 49* 30  ALKPHOS 71 54  BILITOT 0.6 0.6  PROT 6.9 6.4*  ALBUMIN 2.5* 2.8*   No results for input(s): LIPASE,  AMYLASE in the last 168 hours. No results for input(s): AMMONIA in the last 168 hours. Coagulation Profile: No results for input(s): INR, PROTIME in the last 168 hours. Cardiac Enzymes: No results for input(s): CKTOTAL, CKMB, CKMBINDEX, TROPONINI in the last 168 hours. BNP (last 3 results) No results for input(s): PROBNP in the last 8760 hours. HbA1C: No results for input(s): HGBA1C in the last 72 hours. CBG: No results for input(s): GLUCAP in the last 168 hours. Lipid Profile: No results for input(s): CHOL, HDL, LDLCALC, TRIG, CHOLHDL, LDLDIRECT in the last 72 hours. Thyroid Function Tests: No results for input(s): TSH, T4TOTAL, FREET4, T3FREE, THYROIDAB in the last 72 hours. Anemia Panel: Recent Labs    10/22/21 1023 10/23/21 0911  FERRITIN 345* 284   Urine analysis:    Component Value Date/Time   COLORURINE STRAW (A) 05/06/2021 1616   APPEARANCEUR CLEAR (A) 05/06/2021 1616   APPEARANCEUR Clear 04/21/2021 1551   LABSPEC 1.010 05/06/2021 1616   PHURINE 8.0 05/06/2021 1616   GLUCOSEU NEGATIVE 05/06/2021 1616   HGBUR SMALL (A) 05/06/2021 1616   BILIRUBINUR NEGATIVE 05/06/2021 1616   BILIRUBINUR Negative 04/21/2021 1551   KETONESUR NEGATIVE 05/06/2021 1616   PROTEINUR NEGATIVE 05/06/2021 1616   UROBILINOGEN 0.2 01/07/2020 1406  UROBILINOGEN 0.2 11/25/2014 1849   NITRITE NEGATIVE 05/06/2021 1616   LEUKOCYTESUR NEGATIVE 05/06/2021 1616   Sepsis Labs: _0 (procalcitonin:4,lacticidven:4)  ) Recent Results (from the past 240 hour(s))  Resp Panel by RT-PCR (Flu A&B, Covid) Nasopharyngeal Swab     Status: Abnormal   Collection Time: 10/19/21  8:24 PM   Specimen: Nasopharyngeal Swab; Nasopharyngeal(NP) swabs in vial transport medium  Result Value Ref Range Status   SARS Coronavirus 2 by RT PCR POSITIVE (A) NEGATIVE Final    Comment: (NOTE) SARS-CoV-2 target nucleic acids are DETECTED.  The SARS-CoV-2 RNA is generally detectable in upper respiratory specimens  during the acute phase of infection. Positive results are indicative of the presence of the identified virus, but do not rule out bacterial infection or co-infection with other pathogens not detected by the test. Clinical correlation with patient history and other diagnostic information is necessary to determine patient infection status. The expected result is Negative.  Fact Sheet for Patients: EntrepreneurPulse.com.au  Fact Sheet for Healthcare Providers: IncredibleEmployment.be  This test is not yet approved or cleared by the [place] FDA and  has been authorized for detection and/or diagnosis of SARS-CoV-2 by FDA under an Emergency Use Authorization (EUA).  This EUA will remain in effect (meaning this test can be used) for the duration of  the COVID-19 declaration under Section 564(b)(1) of the A ct, 21 U.S.C. section 360bbb-3(b)(1), unless the authorization is terminated or revoked sooner.     Influenza A by PCR NEGATIVE NEGATIVE Final   Influenza B by PCR NEGATIVE NEGATIVE Final    Comment: (NOTE) The Xpert Xpress SARS-CoV-2/FLU/RSV plus assay is intended as an aid in the diagnosis of influenza from Nasopharyngeal swab specimens and should not be used as a sole basis for treatment. Nasal washings and aspirates are unacceptable for Xpert Xpress SARS-CoV-2/FLU/RSV testing.  Fact Sheet for Patients: EntrepreneurPulse.com.au  Fact Sheet for Healthcare Providers: IncredibleEmployment.be  This test is not yet approved or cleared by the [place] FDA and has been authorized for detection and/or diagnosis of SARS-CoV-2 by FDA under an Emergency Use Authorization (EUA). This EUA will remain in effect (meaning this test can be used) for the duration of the COVID-19 declaration under Section 564(b)(1) of the Act, 21 U.S.C. section 360bbb-3(b)(1), unless the authorization is terminated  or revoked.  Performed at [facility], [address] [place], [place] [address]   Culture, blood (Routine X 2) w Reflex to ID Panel     Status: None (Preliminary result)   Collection Time: 10/21/21  7:21 AM   Specimen: Right Antecubital  Result Value Ref Range Status   Specimen Description RIGHT ANTECUBITAL  Final   Special Requests   Final    BOTTLES DRAWN AEROBIC AND ANAEROBIC Blood Culture adequate volume   Culture   Final    NO GROWTH 2 DAYS Performed at [facility], [address], [place], [place] [address]    Report Status PENDING  Incomplete  Culture, blood (Routine X 2) w Reflex to ID Panel     Status: None (Preliminary result)   Collection Time: 10/21/21  7:23 AM   Specimen: BLOOD RIGHT ARM  Result Value Ref Range Status   Specimen Description BLOOD RIGHT ARM  Final   Special Requests   Final    BOTTLES DRAWN AEROBIC ONLY Blood Culture adequate volume   Culture   Final    NO GROWTH 2 DAYS Performed at [facility], [address], [place], [place] [address]  Report Status PENDING  Incomplete      Studies: DG Chest Port 1 View  Result Date: 10/23/2021 CLINICAL DATA:  66 year old female with chronic lung disease. Recent increasing shortness of breath. EXAM: PORTABLE CHEST 1 VIEW COMPARISON:  Chest CTA 10/19/2021 and earlier. FINDINGS: Portable AP upright view at 0922 hours. Reduced lung volumes and upper lung predominant pulmonary fibrosis. Stable lung volumes and ventilation since 10/19/2021. Stable cardiac size and mediastinal contours. Multilevel augmented spinal compression fractures. Tracheal air column within normal limits. Paucity of bowel gas in the upper abdomen. IMPRESSION: Stable chronic lung disease. No superimposed acute findings are identified. Electronically Signed   By: Genevie Ann M.D.   On: 10/23/2021 09:40    Scheduled Meds:  acidophilus  1 capsule Oral Daily   albuterol  2 puff Inhalation QID   vitamin C  500 mg Oral  Daily   [START ON 10/24/2021] azithromycin  500 mg Oral Daily   benzonatate  200 mg Oral TID   dorzolamide  1 drop Both Eyes BID   enoxaparin (LOVENOX) injection  40 mg Subcutaneous QHS   escitalopram  20 mg Oral Daily   folic acid  1 mg Oral Daily   guaiFENesin  600 mg Oral BID   latanoprost  1 drop Right Eye BID   levothyroxine  137 mcg Oral QAC breakfast   lidocaine  1 patch Transdermal Q24H   mometasone-formoterol  2 puff Inhalation BID   multivitamin with minerals  1 tablet Oral Daily   potassium chloride SA  20 mEq Oral BID   predniSONE  50 mg Oral Daily   Treprostinil  72 mcg Inhalation QID   zinc sulfate  220 mg Oral Daily    Continuous Infusions:  albumin human Stopped (10/23/21 1056)   cefTRIAXone (ROCEPHIN)  IV Stopped (10/23/21 0031)   remdesivir 100 mg in NS 100 mL Stopped (10/23/21 1206)     LOS: 4 days     Kayleen Memos, MD [facility] Pager [phone]  If 7PM-7AM, please contact night-coverage [email or web address] Password [facility] 10/23/2021, 12:57 PM

## 2021-10-23 NOTE — Progress Notes (Signed)
PHARMACIST - PHYSICIAN COMMUNICATION  CONCERNING: Antibiotic IV to Oral Route Change Policy  RECOMMENDATION: This patient is receiving azithromycin by the intravenous route.  Based on criteria approved by the Pharmacy and Therapeutics Committee, the antibiotic(s) is/are being converted to the equivalent oral dose form(s).   DESCRIPTION: These criteria include: Patient being treated for a respiratory tract infection, urinary tract infection, cellulitis or clostridium difficile associated diarrhea if on metronidazole The patient is not neutropenic and does not exhibit a GI malabsorption state The patient is eating (either orally or via tube) and/or has been taking other orally administered medications for a least 24 hours The patient is improving clinically and has a Tmax < 100.5  If you have questions about this conversion, please contact the [place] 10/23/21

## 2021-10-23 NOTE — Progress Notes (Signed)
NAME:  HAWA HENLY, MRN:  [id number], DOB:  10-02-1956, LOS: 4 ADMISSION DATE:  10/19/2021, CONSULTATION DATE:  10/20/2021 REFERRING MD:  Judd Gaudier MD  CHIEF COMPLAINT: SOB   HPI  66 y.o female with significant PMH of  chronic respiratory failure with hypoxia, Arrhythmia, Asthma, Chronic kidney disease, Depression, GERD, Heart murmur, History of kidney stones, hard of hearing, Hyperthyroidism, Hypothyroidism, IBS, Pneumonia, Pulmonary hypertension, Sarcoidosis, Sleep apnea (CPAP with O2), Stroke(07/2020) presented to the ED with acute onset of worsening dyspnea associated with productive yellow sputum cough, wheezing, chills without fever mild abdominal pain.  Past Medical History  Acute on chronic respiratory failure with hypoxia ([place]), Arrhythmia, Asthma, Chronic kidney disease, Depression, GERD (gastroesophageal reflux disease), Heart murmur, History of kidney stones, HOH (hard of hearing), Hyperthyroidism, Hypothyroidism, IBS (irritable bowel syndrome), Pneumonia, Pulmonary hypertension ([place]), Sarcoid, Sarcoidosis, Seasonal allergies, Sleep apnea (CPAP with O2), Stroke ([place]) (07/2020), and Wears hearing aid in both ears.  Significant Hospital Events   1/3: Admitted for acute hypoxic respiratory failure secondary to COVID-19 with Acute Pneumonia  1/4: PCCM consulted  Consults:  PCCM Podiatry  Procedures:  None  Significant Diagnostic Tests:  1/3: Chest Xray>showed stable extensive chronic findings consistent with sarcoidosis and interval development of mild mild right lung scarring and/or atelectasis. 1/3: CTA Chest>1. No evidence of pulmonary embolism. 2. Stable findings consistent with end-stage sarcoid. 3. 4 mm and 8 mm ill-defined subsolid left lower lobe lung nodules. Non-contrast chest CT at 3-6 months is recommended. If nodules persist and are stable at that time, consider additional non-contrast chest CT examinations at 2 and 4 years. This recommendation follows the  consensus statement: Guidelines for Management of Incidental Pulmonary Nodules Detected on CT Images: From the Fleischner Society 2017; Radiology 2017; 284:228-243. 4. Small hiatal hernia. 5. Evidence of prior vertebroplasty throughout multiple lower thoracic and upper lumbar spine vertebra. 6. Aortic atherosclerosis  Micro Data:  1/3: SARS-CoV-2 PCR> POSITIVE 1/3: Influenza PCR> negative 1/5: Blood culture x2> 1/5: Urine Culture> 1/5: MRSA PCR>>  1/5: Strep pneumo urinary antigen> 1/5: Legionella urinary antigen> 1/5: Mycoplasma pneumonia>  Antimicrobials:  Azithromycin 1/3> Ceftriaxone 1/3>  OBJECTIVE  Blood pressure (!) 148/96, pulse 88, temperature 98.3 F (36.8 C), temperature source Oral, resp. rate 13, SpO2 100 %.        Intake/Output Summary (Last 24 hours) at 10/23/2021 0909 Last data filed at 10/22/2021 2318 Gross per 24 hour  Intake 483.11 ml  Output 800 ml  Net -316.89 ml    There were no vitals filed for this visit.   Physical Examination  GENERAL: 66 year-old critically ill patient lying in the bed with no acute distress.  EYES: Pupils equal, round, reactive to light and accommodation. No scleral icterus. Extraocular muscles intact.  HEENT: Head atraumatic, normocephalic. Oropharynx and nasopharynx clear.  NECK:  Supple, no jugular venous distention. No thyroid enlargement, no tenderness.  LUNGS: Normal breath sounds bilaterally, no wheezing, rales,rhonchi or crepitation. No use of accessory muscles of respiration.  CARDIOVASCULAR: S1, S2 normal. No murmurs, rubs, or gallops.  ABDOMEN: Soft, nontender, nondistended. Bowel sounds present. No organomegaly or mass.  EXTREMITIES: No pedal edema, cyanosis, or clubbing.  NEUROLOGIC: Cranial nerves II through XII are intact.  Muscle strength 5/5 in all extremities. Sensation intact. Gait not checked.  PSYCHIATRIC: The patient is alert and oriented x 3.  SKIN: No obvious rash, lesion, or ulcer.   Labs/imaging  that I havepersonally reviewed  (right click and "Reselect all SmartList Selections" daily)  Labs   CBC: Recent Labs  Lab 10/19/21 2024 10/20/21 0806 10/21/21 1621 10/23/21 0742  WBC 5.0 2.3* 2.2* 2.1*  NEUTROABS  --   --  2.1  --   HGB 11.6* 10.2* 10.4* 10.9*  HCT 37.2 33.0* 33.1* 35.2*  MCV 96.9 95.9 94.3 93.6  PLT 385 356 452* 499*     Basic Metabolic Panel: Recent Labs  Lab 10/19/21 2024 10/20/21 0806 10/21/21 0720 10/22/21 1023  NA 139 138 144 141  K 4.0 4.3 4.6 4.2  CL 99 98 98 95*  CO2 29 29 36* 36*  GLUCOSE 257* 199* 151* 195*  BUN 29* 31* 39* 44*  CREATININE 1.10* 1.05* 0.99 0.99  CALCIUM 8.9 8.2* 8.5* 8.5*  MG  --   --  2.3  --   PHOS  --   --  3.1  --     GFR: Estimated Creatinine Clearance: 60.4 mL/min (by C-G formula based on SCr of 0.99 mg/dL). Recent Labs  Lab 10/19/21 2024 10/20/21 0125 10/20/21 0806 10/21/21 0709 10/21/21 1621 10/23/21 0742  PROCALCITON  --  0.60  --  0.31  --   --   WBC 5.0  --  2.3*  --  2.2* 2.1*  LATICACIDVEN  --   --   --  1.3  --   --      Liver Function Tests: Recent Labs  Lab 10/21/21 0720  AST 28  ALT 49*  ALKPHOS 71  BILITOT 0.6  PROT 6.9  ALBUMIN 2.5*    No results for input(s): LIPASE, AMYLASE in the last 168 hours. No results for input(s): AMMONIA in the last 168 hours.  ABG    Component Value Date/Time   PHART 7.41 07/16/2020 0841   PCO2ART 52 (H) 07/16/2020 0841   PO2ART 76 (L) 07/16/2020 0841   HCO3 31.7 (H) 10/19/2021 2032   TCO2 30 01/14/2021 1138   ACIDBASEDEF 0.5 07/12/2020 1205   O2SAT 33.6 10/19/2021 2032      Coagulation Profile: No results for input(s): INR, PROTIME in the last 168 hours.  Cardiac Enzymes: No results for input(s): CKTOTAL, CKMB, CKMBINDEX, TROPONINI in the last 168 hours.  HbA1C: Hgb A1c MFr Bld  Date/Time Value Ref Range Status  07/16/2020 07:06 AM 6.4 (H) 4.8 - 5.6 % Final    Comment:    (NOTE) Pre diabetes:          5.7%-6.4%  Diabetes:               >6.4%  Glycemic control for   <7.0% adults with diabetes   04/20/2017 09:09 AM 6.0 (H) 4.8 - 5.6 % Final    Comment:             Pre-diabetes: 5.7 - 6.4          Diabetes: >6.4          Glycemic control for adults with diabetes: <7.0     CBG: No results for input(s): GLUCAP in the last 168 hours.  Review of Systems:   10 point ROS done and is negative except for chest discomfort  Past Medical History  She,  has a past medical history of Acute respiratory failure with hypoxia ([place]), Arrhythmia, Asthma, Chronic kidney disease, Depression, GERD (gastroesophageal reflux disease), Heart murmur, History of kidney stones, HOH (hard of hearing), Hyperthyroidism, Hypothyroidism, IBS (irritable bowel syndrome), Pneumonia, Pulmonary hypertension ([place]), Sarcoid, Sarcoidosis, Seasonal allergies, Sleep apnea (CPAP with O2), Stroke ([place]) (07/2020), and Wears hearing aid in both ears.  Surgical History   ° °Past Surgical History:  °Procedure Laterality Date  ° ABDOMINAL HYSTERECTOMY    ° partial  ° AMPUTATION Left 01/14/2021  ° Procedure: AMPUTATION RAY (1ST TOE ) ( 2ND METATARSAL HEAD RESECTION);  Surgeon: Dew, Jason S, MD;  Location: [facility];  Service: Vascular;  Laterality: Left;  ° CARDIAC CATHETERIZATION  10/18/2018  ° [facility]  ° CATARACT EXTRACTION W/PHACO Left 07/31/2019  ° Procedure: CATARACT EXTRACTION PHACO AND INTRAOCULAR LENS PLACEMENT (IOC) LEFT 00:51.1  17.9%  9.15;  Surgeon: Brasington, Chadwick, MD;  Location: [facility];  Service: Ophthalmology;  Laterality: Left;  keep this patient second  ° COLON SURGERY    ° "colon was fused to bladder - operated on both"  ° COLONOSCOPY  09/18/2007  ° diverticuli, no polyps  ° COLONOSCOPY  05/26/2010  ° diverticuli, no polyps  ° CYSTOSCOPY WITH STENT PLACEMENT Bilateral 07/14/2020  ° Procedure: CYSTOSCOPY WITH STENT PLACEMENT, RETROPYLOGRAM;  Surgeon: Sninsky, Brian C, MD;  Location: [facility];  Service: Urology;  Laterality: Bilateral;  °  CYSTOSCOPY/URETEROSCOPY/HOLMIUM LASER/STENT PLACEMENT Left 02/20/2020  ° Procedure: CYSTOSCOPY/URETEROSCOPY/LITHOTRIPSY /STENT PLACEMENT;  Surgeon: Brandon, Ashley, MD;  Location: [facility];  Service: Urology;  Laterality: Left;  ° CYSTOSCOPY/URETEROSCOPY/HOLMIUM LASER/STENT PLACEMENT Bilateral 10/02/2020  ° Procedure: CYSTOSCOPY/URETEROSCOPY/HOLMIUM LASER/STENT PLACEMENT;  Surgeon: Sninsky, Brian C, MD;  Location: [facility];  Service: Urology;  Laterality: Bilateral;  ° EYE SURGERY    ° KYPHOPLASTY N/A 05/10/2021  ° Procedure: KYPHOPLASTY, L2, L3, L4;  Surgeon: Menz, Michael, MD;  Location: [facility];  Service: Orthopedics;  Laterality: N/A;  ° KYPHOPLASTY N/A 06/25/2021  ° Procedure: T10, T12, L1 KYPHOPLASTY;  Surgeon: Menz, Michael, MD;  Location: [facility];  Service: Orthopedics;  Laterality: N/A;  ° PARS PLANA VITRECTOMY Right 05/20/2015  ° Procedure: PARS PLANA VITRECTOMY WITH 25 GAUGE, laser;  Surgeon: Jessica Plainview, MD;  Location: [facility];  Service: Ophthalmology;  Laterality: Right;  ° PARTIAL HYSTERECTOMY  1990  ° TEE WITHOUT CARDIOVERSION N/A 07/27/2020  ° Procedure: TRANSESOPHAGEAL ECHOCARDIOGRAM (TEE);  Surgeon: Fath, Kenneth A, MD;  Location: [facility];  Service: Cardiovascular;  Laterality: N/A;  ° TUBAL LIGATION    ° WISDOM TOOTH EXTRACTION    °  ° °Social History  ° reports that she has never smoked. She has never used smokeless tobacco. She reports that she does not drink alcohol and does not use drugs.  ° °Family History   °Her family history includes Allergies in her brother and father; Asthma in her brother and father; Breast cancer in her maternal grandmother; Colon cancer in her father.  ° °Allergies °Allergies  °Allergen Reactions  ° Corn-Containing Products   °  Upset stomach   ° Nitrofurantoin Nausea Only  ° Plaquenil [Hydroxychloroquine]   °  Unknown reaction   ° Tramadol Nausea And Vomiting  ° Sulfa Antibiotics Rash  °  As an infant  °  ° °Home Medications  °Prior to Admission medications    °Medication Sig Start Date End Date Taking? Authorizing Provider  °acetaminophen (TYLENOL) 500 MG tablet Take 500 mg by mouth every 6 (six) hours as needed for moderate pain or headache.   Yes [provider]  °albuterol (PROVENTIL) (2.5 MG/3ML) 0.083% nebulizer solution Inhale 2.5 mg into the lungs every 6 (six) hours as needed for wheezing or shortness of breath. 01/04/21 01/04/22 Yes [provider]  °budesonide-formoterol (SYMBICORT) 80-4.5 MCG/ACT inhaler Take 2 puffs first thing in am and then another 2 puffs about 12 hours later. °Patient taking   differently: Inhale 2 puffs into the lungs in the morning and at bedtime. 08/13/20  Yes Angiulli, Lavon Paganini, PA-C  cefdinir (OMNICEF) 300 MG capsule Take 1 capsule (300 mg total) by mouth 2 (two) times daily for 10 days. 10/13/21 10/23/21 Yes Glean Hess, MD  dorzolamide (TRUSOPT) 2 % ophthalmic solution Place 1 drop into both eyes 2 (two) times daily.   Yes [provider]  escitalopram (LEXAPRO) 20 MG tablet TAKE 1 TABLET BY MOUTH EVERY DAY 08/18/21  Yes Glean Hess, MD  folic acid (FOLVITE) 1 MG tablet Take 1 tablet (1 mg total) by mouth daily. 08/13/20  Yes Angiulli, Lavon Paganini, PA-C  HYDROcodone bit-homatropine (HYCODAN) 5-1.5 MG/5ML syrup Take 5 mLs by mouth every 6 (six) hours as needed for up to 10 days for cough. 10/13/21 10/23/21 Yes Glean Hess, MD  latanoprost (XALATAN) 0.005 % ophthalmic solution Place 1 drop into the right eye 2 (two) times daily.   Yes [provider]  levothyroxine (SYNTHROID) 137 MCG tablet TAKE 1 TABLET (137 MCG TOTAL) BY MOUTH DAILY BEFORE BREAKFAST. Patient taking differently: Take 137 mcg by mouth daily before breakfast. 12/08/20  Yes Glean Hess, MD  Menthol (ICY HOT) 5 % [facility] Apply 1 patch topically daily as needed (pain).   Yes [provider]  methotrexate (RHEUMATREX) 2.5 MG tablet Take 10 tablets (25 mg total) by mouth every Tuesday. 02/23/21  Yes Elgergawy,  Silver Huguenin, MD  Multiple Vitamins-Minerals (MULTIVITAMIN WITH MINERALS) tablet Take 1 tablet by mouth daily.   Yes [provider]  naproxen sodium (ALEVE) 220 MG tablet Take 220 mg by mouth daily as needed (pain).   Yes [provider]  potassium citrate (UROCIT-K) 10 MEQ (1080 MG) SR tablet Take 20 mEq by mouth 2 (two) times daily. 05/31/21  Yes [provider]  predniSONE (DELTASONE) 10 MG tablet Take 19 mg by mouth daily.   Yes [provider]  Probiotic Product (PROBIOTIC PO) Take 1 capsule by mouth daily.   Yes [provider]  Treprostinil (TYVASO) 0.6 MG/ML SOLN Inhale into the lungs See admin instructions. 12 breaths 4 times a day   Yes [provider]  cyclobenzaprine (FLEXERIL) 5 MG tablet Take 5-10 mg by mouth 3 (three) times daily as needed. 07/09/21   [provider]  denosumab (PROLIA) 60 MG/ML SOSY injection Inject 60 mg into the skin every 6 (six) months.    [provider]  HYDROcodone-acetaminophen (NORCO) 5-325 MG tablet Take 1 tablet by mouth every 6 (six) hours as needed for moderate pain. Patient not taking: Reported on 10/19/2021 06/25/21   Hessie Knows, MD  NON FORMULARY CPAP nightly    [provider]  OXYGEN Inhale 2 L into the lungs daily as needed (During walking and activity).     [provider]  Scheduled Meds:  acidophilus  1 capsule Oral Daily   vitamin C  500 mg Oral Daily   benzonatate  200 mg Oral TID   dorzolamide  1 drop Both Eyes BID   enoxaparin (LOVENOX) injection  40 mg Subcutaneous QHS   escitalopram  20 mg Oral Daily   folic acid  1 mg Oral Daily   guaiFENesin  600 mg Oral BID   Ipratropium-Albuterol  1 puff Inhalation Q6H   latanoprost  1 drop Right Eye BID   levothyroxine  137 mcg Oral QAC breakfast   lidocaine  1 patch Transdermal Q24H   multivitamin with minerals  1 tablet  Oral Daily   potassium chloride SA  20 mEq Oral BID   predniSONE  50 mg Oral Daily    Treprostinil  72 mcg Inhalation QID   zinc sulfate  220 mg Oral Daily   Continuous Infusions:  albumin human Stopped (10/22/21 1339)   azithromycin 500 mg (10/23/21 0855)   cefTRIAXone (ROCEPHIN)  IV Stopped (10/23/21 0031)   remdesivir 100 mg in NS 100 mL     PRN Meds:.acetaminophen **OR** acetaminophen, albuterol, chlorpheniramine-HYDROcodone, cyclobenzaprine, guaiFENesin-dextromethorphan, magnesium hydroxide, metoprolol tartrate, Muscle Rub, ondansetron **OR** ondansetron (ZOFRAN) IV, traZODone      Assessment & Plan:  Acute on Chronic Hypoxic Respiratory Failure secondary to COVID-19 Pneumonia  AND Asthma exacerbation PMHx: ILD, interstitial lung disease on chronic home O2, sarcoidosis, asthma -Supplemental O2 as needed to maintain O2 saturations 88 to 92% -Follow intermittent ABG and chest x-ray as needed -Continue remdesivir x5 days  -Continue steroids-Now off IV solumedrol to pred 50, monitor for recurrence of symptoms and possible worsening inflammatory biomarkers, crp/ddimer/ferritin/esr/ace -Empiric abx coverage given risk factors (ILD+ Chronic renal disease) with Ceftriaxone + Azithromycin pending cultures,-no signs of bacterial pna at this time -Ensure adequate pulmonary hygiene  -Budesonide inhaler nebs BID, bronchodilators PRNBronchodilators PRN -Encourage OOB, IS, FV, and awake proning if able -Continue airborne, contact precautions for 21 days from positive testing. -Monitor CMP and inflammatory markers -Heparin prophylactic dose.   Leukopenia    Supspect lymphopenia due to COVID and relative WBC suppresion in context of infection/lower extermity    Pulmonary HTN - ILD associated PH due to Sarcoidosis    - continue home [place] QID    - c/w steroids , now patient is on PO pred only  -repeat CXR - 1/7 /22  Pulmonary comorbid conditions   OSA/sarcoidosis    - continue supportive care with CPAP   - steroids    - PT/OT  Sepsis due to osteomyelitis of left toe   -Supplemental oxygen as needed, to maintain SpO2 > 90% -F/u cultures, trend lactic/ PCT -Monitor WBC/ fever curve -Gentle IVF hydration as needed -Repeat blood cultures until clear; follow up trach aspirate -PRN Pressors for MAP goal >65 -Strict I/O's -Podiatry following with no recommendations at this time. Will continue to follow cultures as above    AKI on CKD Stage III -Monitor I&O's / urinary output -Follow BMP -Ensure adequate renal perfusion -Avoid nephrotoxic agents as able -Replace electrolytes as indicated   Hypothyroidism -Continue Synthroid  Best practice:  Diet:  Oral Pain/Anxiety/Delirium protocol (if indicated): Yes (RASS goal 0) VAP protocol (if indicated): Not indicated DVT prophylaxis: LMWH GI prophylaxis: PPI Glucose control:  SSI Yes Central venous access:  N/A Arterial line:  N/A Foley:  no Mobility:  OOB  PT consulted: Yes Last date of multidisciplinary goals of care discussion [full code] Code Status:  DNR Disposition: SDU   = Goals of Care = Code Status Order: _0 @   Primary Emergency Contact: [place] Wishes to pursue full aggressive treatment and intervention options, including CPR and intubation, but goals of care will be addressed on going with family if that should become necessary.   Ottie Glazier, M.D.  Pulmonary & Critical Care Medicine     .

## 2021-10-23 NOTE — TOC Initial Note (Signed)
Transition of Care [facility]) - Initial/Assessment Note    Patient Details  Name: Stacie [name] MRN: [id number] Date of Birth: 08/30/1956  [facility]) CM/SW Contact:    Anselm Pancoast, RN Phone Number: 10/23/2021, 3:37 PM  Clinical Narrative:                 Spoke to daughter, [name] who is primary caregiver for patient. Patient is on home O2 and has strong support system with family. Daughter is available 24/7 to assist as needed. Has had HHC in the past but does not remember which agency. Open to home health again if needed.   Expected Discharge Plan: Home/Self Care Barriers to Discharge: No Barriers Identified   Patient Goals and CMS Choice Patient states their goals for this hospitalization and ongoing recovery are:: Return home and get stronger      Expected Discharge Plan and Services Expected Discharge Plan: Home/Self Care       Living arrangements for the past 2 months: Single Family Home                                      Prior Living Arrangements/Services Living arrangements for the past 2 months: Single Family Home Lives with:: Adult Children, Spouse Patient language and need for interpreter reviewed:: Yes Do you feel safe going back to the place where you live?: Yes      Need for Family Participation in Patient Care: Yes (Comment) Care giver support system in place?: Yes (comment)   Criminal Activity/Legal Involvement Pertinent to Current Situation/Hospitalization: No - Comment as needed  Activities of Daily Living      Permission Sought/Granted                  Emotional Assessment Appearance:: Appears stated age Attitude/Demeanor/Rapport: Unable to Assess Affect (typically observed): Unable to Assess Orientation: : Oriented to Self, Oriented to Place, Oriented to  Time, Oriented to Situation (per notes-unable to assess in person due to COVID precautions) Alcohol / Substance Use: Not Applicable Psych Involvement: No  (comment)  Admission diagnosis:  ARF (acute respiratory failure) (HCC) [J96.00] Acute respiratory failure (HCC) [J96.00] Patient Active Problem List   Diagnosis Date Noted   Acute respiratory failure ([place]) 10/23/2021   ARF (acute respiratory failure) ([place]) 10/19/2021   Lumbar compression fracture ([place]) 05/11/2021   Open wound of foot 05/11/2021   Syncope and collapse 05/06/2021   Acquired absence of left great toe ([place]) 02/15/2021   Acquired absence of other left toe(s) ([place]) 02/15/2021   Acute on chronic respiratory failure with hypoxia ([place]) 01/31/2021   History of CVA (cerebrovascular accident) 01/31/2021   Gangrene of left foot ([place]) 11/03/2020   Labile blood glucose    Steroid-induced hyperglycemia    Thrombocytopenia (HCC)    Acute blood loss anemia    Slow transit constipation    AKI (acute kidney injury) ([place])    Supplemental oxygen dependent    Klebsiella pneumoniae sepsis (HCC)    Critical lower limb ischemia (HCC)    Acute renal failure (HCC)    Cerebrovascular accident (CVA) due to embolism of cerebral artery ([place])    Sepsis ([place]) 07/10/2020   Pulmonary hypertension ([place]) 12/18/2018   Depression, major, recurrent, in partial remission ([place]) 10/22/2018   Compression fx, thoracic spine ([place]) 12/04/2017   Hyperlipidemia, mixed 04/21/2017   Cough variant asthma  vs UACS  02/20/2017  Hypothyroidism, acquired 03/27/2015   Essential hypertension 03/27/2015   Acquired absence of both cervix and uterus 03/27/2015   H/O renal calculi 03/27/2015   Allergic rhinitis 01/07/2015   Obstructive sleep apnea 09/29/2014   Other malaise and fatigue 06/03/2014   Asthma, chronic 02/24/2014   Cough 04/30/2013   Pulmonary sarcoidosis ([place]) 03/12/2013   CKD (chronic kidney disease) stage 3, GFR 30-59 ml/min (HCC) 03/12/2013   Calcium blood increased 05/23/2012   PCP:  Glean Hess, MD Pharmacy:   [id number] [place] [place] [address]Phone: [phone]: [phone] [facility] [address]Phone: [phone]: [phone]    Social Determinants of Health (SDOH) Interventions    Readmission Risk Interventions Readmission Risk Prevention Plan 05/07/2021 07/17/2020  Transportation Screening Complete Complete  PCP or Specialist Appt within 3-5 Days Complete Complete  HRI or Home Care Consult Complete Complete  Social Work Consult for [place] Complete -  Palliative Care Screening Complete Complete  Medication Review (Press photographer Complete Complete  Some recent data might be hidden

## 2021-10-23 NOTE — ED Notes (Signed)
Patient is resting comfortably. 

## 2021-10-23 NOTE — ED Notes (Signed)
Informed RN bed assigned 

## 2021-10-23 NOTE — ED Notes (Signed)
Dr. [place] Crane and Dr. Lanney Gins at bedside at this time.

## 2021-10-23 NOTE — ED Notes (Signed)
XR at bedside

## 2021-10-24 DIAGNOSIS — J9601 Acute respiratory failure with hypoxia: Secondary | ICD-10-CM | POA: Diagnosis not present

## 2021-10-24 LAB — C-REACTIVE PROTEIN: CRP: 2.3 mg/dL — ABNORMAL HIGH (ref <1.0)

## 2021-10-24 LAB — COMPREHENSIVE METABOLIC PANEL
ALT: 29 U/L (ref 0–44)
AST: 18 U/L (ref 15–41)
Albumin: 2.9 g/dL — ABNORMAL LOW (ref 3.5–5.0)
Alkaline Phosphatase: 54 U/L (ref 38–126)
Anion gap: 8 (ref 5–15)
BUN: 37 mg/dL — ABNORMAL HIGH (ref 8–23)
CO2: 33 mmol/L — ABNORMAL HIGH (ref 22–32)
Calcium: 8.5 mg/dL — ABNORMAL LOW (ref 8.9–10.3)
Chloride: 97 mmol/L — ABNORMAL LOW (ref 98–111)
Creatinine, Ser: 0.79 mg/dL (ref 0.44–1.00)
GFR, Estimated: >60 mL/min (ref >60)
Glucose, Bld: 111 mg/dL — ABNORMAL HIGH (ref 70–99)
Potassium: 4 mmol/L (ref 3.5–5.1)
Sodium: 138 mmol/L (ref 135–145)
Total Bilirubin: 0.4 mg/dL (ref 0.3–1.2)
Total Protein: 6.1 g/dL — ABNORMAL LOW (ref 6.5–8.1)

## 2021-10-24 LAB — MAGNESIUM: Magnesium: 2.1 mg/dL (ref 1.7–2.4)

## 2021-10-24 LAB — PHOSPHORUS: Phosphorus: 2.8 mg/dL (ref 2.5–4.6)

## 2021-10-24 LAB — STREP PNEUMONIAE URINARY ANTIGEN: Strep Pneumo Urinary Antigen: POSITIVE — AB

## 2021-10-24 LAB — CBC
HCT: 34.8 % — ABNORMAL LOW (ref 36.0–46.0)
Hemoglobin: 11 g/dL — ABNORMAL LOW (ref 12.0–15.0)
MCH: 29.5 pg (ref 26.0–34.0)
MCHC: 31.6 g/dL (ref 30.0–36.0)
MCV: 93.3 fL (ref 80.0–100.0)
Platelets: 465 10*3/uL — ABNORMAL HIGH (ref 150–400)
RBC: 3.73 MIL/uL — ABNORMAL LOW (ref 3.87–5.11)
RDW: 13.3 % (ref 11.5–15.5)
WBC: 3.2 10*3/uL — ABNORMAL LOW (ref 4.0–10.5)
nRBC: 0 % (ref 0.0–0.2)

## 2021-10-24 LAB — FERRITIN: Ferritin: 210 ng/mL (ref 11–307)

## 2021-10-24 LAB — D-DIMER, QUANTITATIVE: D-Dimer, Quant: 1.63 ug/mL-FEU — ABNORMAL HIGH (ref 0.00–0.50)

## 2021-10-24 MED ORDER — FUROSEMIDE 10 MG/ML IJ SOLN
40.0000 mg | Freq: Every day | INTRAMUSCULAR | Status: DC
  Administered 2021-10-24 – 2021-10-25 (×2): 40 mg via INTRAVENOUS
  Filled 2021-10-24 (×2): qty 4

## 2021-10-24 MED ORDER — PREDNISONE 20 MG PO TABS
40.0000 mg | ORAL_TABLET | Freq: Every day | ORAL | Status: DC
  Administered 2021-10-25 – 2021-10-26 (×2): 40 mg via ORAL
  Filled 2021-10-24 (×2): qty 2

## 2021-10-24 NOTE — Progress Notes (Signed)
PROGRESS NOTE    Stacie [name]  YTK:[id number] DOB: 1956-08-04 DOA: 10/19/2021 PCP: Glean Hess, MD   Chief Complain: Shortness of breath  Brief Narrative: Patient is a 66 year old female with history of GERD, hypothyroidism, sarcoidosis, sleep apnea , CVA, asthma, chronic hypoxia on 2 L of oxygen at home, the patient who presented to the ER with complaints of worsening dyspnea, cough, wheezing.  On presentation, COVID screening test came out to be positive.  She required BiPAP on presentation.  Started on remdesivir, IV Solu-Medrol, IV antibiotics.  Still on high oxygen requirement, currently on 8 L/min.  Goal is to discharge to home with home with home health after improvement in the oxygen requirement  Assessment & Plan:   Principal Problem:   ARF (acute respiratory failure) ([place]) Active Problems:   Acute respiratory failure ([place])   Acute on chronic hypoxic respiratory failure: Secondary to COVID infection, could also be contributed by asthma exacerbation.Also has H/O of sarcoidosis.  She is on oxygen at 2 L at home.  Chest x-ray on admission showed bilateral pulmonary infiltrates greater on the left :started on remdesivir( day 4/5), IV antibiotics empirically, IV steroids.  Steroid switched to prednisone.  On vitamin C, folic acid.  On presentation she had to be put on BiPAP. Currently on 8 L of oxygen per minute.  We will continue to wean the oxygen.  She completed course of ceftriaxone.  Continue mucolytic's, spirometer, flutter valve. He might need greater oxygen requirement than her baseline on discharge.  COVID-pneumonia: On presentation she was hypoxic, chest ray showed bilateral infiltrates more on the left.  Continue current management.  She will finish the course of remdesivir tomorrow. Strep pneumoniae urine antigen was positive.  She was on  ceftriaxone, azithromycin  Left forefoot ulceration: Status post first and second partial ray resection.  Ulcer present on  admission.  Podiatry was following here.  Continue wound care on discharge.  History of sleep apnea: Uses CPAP at home  Rheumatoid arthritis: On methotrexate as an outpatient.  Hypothyroidism: On Synthyroid  Depression: on Lexapro  Deconditioning/debility: PT/OT recommend home health on discharge.            DVT prophylaxis:Lovenox Code Status: Full Family Communication: daughter at bedside Patient status:Inpatient  Dispo: The patient is from: Home              Anticipated d/c is to: Home              Anticipated d/c date is:In next 1-2 days   Consultants: PCCM  Procedures:None  Antimicrobials:  Anti-infectives (From admission, onward)    Start     Dose/Rate Route Frequency Ordered Stop   10/24/21 1000  azithromycin (ZITHROMAX) tablet 500 mg        500 mg Oral Daily 10/23/21 1156     10/22/21 1000  remdesivir 100 mg in sodium chloride 0.9 % 100 mL IVPB       See Hyperspace for full Linked Orders Report.   100 mg 200 mL/hr over 30 Minutes Intravenous Daily 10/21/21 1226 10/26/21 0959   10/21/21 1230  remdesivir 200 mg in sodium chloride 0.9% 250 mL IVPB       See Hyperspace for full Linked Orders Report.   200 mg 580 mL/hr over 30 Minutes Intravenous Once 10/21/21 1226 10/21/21 1314   10/21/21 1000  remdesivir 100 mg in sodium chloride 0.9 % 100 mL IVPB  Status:  Discontinued       See Hyperspace for full  Linked Orders Report.   100 mg 200 mL/hr over 30 Minutes Intravenous Daily 10/20/21 0336 10/21/21 1226   10/21/21 0800  azithromycin (ZITHROMAX) 500 mg in sodium chloride 0.9 % 250 mL IVPB  Status:  Discontinued        500 mg 250 mL/hr over 60 Minutes Intravenous Every 24 hours 10/21/21 0626 10/23/21 1156   10/20/21 0500  remdesivir 200 mg in sodium chloride 0.9% 250 mL IVPB  Status:  Discontinued       See Hyperspace for full Linked Orders Report.   200 mg 580 mL/hr over 30 Minutes Intravenous Once 10/20/21 0336 10/21/21 1226   10/20/21 0000  cefTRIAXone  (ROCEPHIN) 1 g in sodium chloride 0.9 % 100 mL IVPB        1 g 200 mL/hr over 30 Minutes Intravenous Daily at bedtime 10/19/21 2328 10/23/21 2138       Subjective:  Patient seen and examined at the bedside this morning.  Hemodynamically stable lying in bed.  Daughter at the bedside.  She was on 8 L of oxygen per minute.  Very weak.  Denies any worsening shortness of breath or cough.   Objective: Vitals:   10/23/21 2124 10/23/21 2330 10/24/21 0512 10/24/21 0817  BP:  (!) 155/94 (!) 155/87 114/81  Pulse:  89 90 93  Resp:  _0 Temp:  97.7 F (36.5 C) 97.8 F (36.6 C) 97.9 F (36.6 C)  TempSrc:    Oral  SpO2: 99% 91%      Intake/Output Summary (Last 24 hours) at 10/24/2021 1048 Last data filed at 10/24/2021 0700 Gross per 24 hour  Intake 290.2 ml  Output 750 ml  Net -459.8 ml   There were no vitals filed for this visit.  Examination:  General exam: Overall comfortable, not in distress HEENT: PERRL Respiratory system:  no wheezes or crackles  Cardiovascular system: S1 & S2 heard, RRR.  Gastrointestinal system: Abdomen is nondistended, soft and nontender. Central nervous system: Alert and oriented Extremities: No edema, no clubbing ,no cyanosis Skin: No rashes, no ulcers,no icterus      Data Reviewed: I have personally reviewed following labs and imaging studies  CBC: Recent Labs  Lab 10/19/21 2024 10/20/21 0806 10/21/21 1621 10/23/21 0742 10/24/21 0507  WBC 5.0 2.3* 2.2* 2.1* 3.2*  NEUTROABS  --   --  2.1  --   --   HGB 11.6* 10.2* 10.4* 10.9* 11.0*  HCT 37.2 33.0* 33.1* 35.2* 34.8*  MCV 96.9 95.9 94.3 93.6 93.3  PLT 385 356 452* 499* 765*   Basic Metabolic Panel: Recent Labs  Lab 10/20/21 0806 10/21/21 0720 10/22/21 1023 10/23/21 0911 10/23/21 1952 10/24/21 0507  NA 138 144 141 139  --  138  K 4.3 4.6 4.2 4.4  --  4.0  CL 98 98 95* 98  --  97*  CO2 29 36* 36* 34*  --  33*  GLUCOSE 199* 151* 195* 139*  --  111*  BUN 31* 39* 44* 40*  --  37*   CREATININE 1.05* 0.99 0.99 0.87  --  0.79  CALCIUM 8.2* 8.5* 8.5* 8.5*  --  8.5*  MG  --  2.3  --  2.2  --  2.1  PHOS  --  3.1  --  1.8* 3.6 2.8   GFR: Estimated Creatinine Clearance: 74.8 mL/min (by C-G formula based on SCr of 0.79 mg/dL). Liver Function Tests: Recent Labs  Lab 10/21/21 0720 10/23/21 0911 10/24/21 0507  AST 28  19 18  ALT 49* 30 29  ALKPHOS 71 54 54  BILITOT 0.6 0.6 0.4  PROT 6.9 6.4* 6.1*  ALBUMIN 2.5* 2.8* 2.9*   No results for input(s): LIPASE, AMYLASE in the last 168 hours. No results for input(s): AMMONIA in the last 168 hours. Coagulation Profile: No results for input(s): INR, PROTIME in the last 168 hours. Cardiac Enzymes: No results for input(s): CKTOTAL, CKMB, CKMBINDEX, TROPONINI in the last 168 hours. BNP (last 3 results) No results for input(s): PROBNP in the last 8760 hours. HbA1C: No results for input(s): HGBA1C in the last 72 hours. CBG: No results for input(s): GLUCAP in the last 168 hours. Lipid Profile: No results for input(s): CHOL, HDL, LDLCALC, TRIG, CHOLHDL, LDLDIRECT in the last 72 hours. Thyroid Function Tests: No results for input(s): TSH, T4TOTAL, FREET4, T3FREE, THYROIDAB in the last 72 hours. Anemia Panel: Recent Labs    10/23/21 0911 10/24/21 0507  FERRITIN 284 210   Sepsis Labs: Recent Labs  Lab 10/20/21 0125 10/21/21 0709  PROCALCITON 0.60 0.31  LATICACIDVEN  --  1.3    Recent Results (from the past 240 hour(s))  Resp Panel by RT-PCR (Flu A&B, Covid) Nasopharyngeal Swab     Status: Abnormal   Collection Time: 10/19/21  8:24 PM   Specimen: Nasopharyngeal Swab; Nasopharyngeal(NP) swabs in vial transport medium  Result Value Ref Range Status   SARS Coronavirus 2 by RT PCR POSITIVE (A) NEGATIVE Final    Comment: (NOTE) SARS-CoV-2 target nucleic acids are DETECTED.  The SARS-CoV-2 RNA is generally detectable in upper respiratory specimens during the acute phase of infection. Positive results are indicative of  the presence of the identified virus, but do not rule out bacterial infection or co-infection with other pathogens not detected by the test. Clinical correlation with patient history and other diagnostic information is necessary to determine patient infection status. The expected result is Negative.  Fact Sheet for Patients: EntrepreneurPulse.com.au  Fact Sheet for Healthcare Providers: IncredibleEmployment.be  This test is not yet approved or cleared by the [place] FDA and  has been authorized for detection and/or diagnosis of SARS-CoV-2 by FDA under an Emergency Use Authorization (EUA).  This EUA will remain in effect (meaning this test can be used) for the duration of  the COVID-19 declaration under Section 564(b)(1) of the A ct, 21 U.S.C. section 360bbb-3(b)(1), unless the authorization is terminated or revoked sooner.     Influenza A by PCR NEGATIVE NEGATIVE Final   Influenza B by PCR NEGATIVE NEGATIVE Final    Comment: (NOTE) The Xpert Xpress SARS-CoV-2/FLU/RSV plus assay is intended as an aid in the diagnosis of influenza from Nasopharyngeal swab specimens and should not be used as a sole basis for treatment. Nasal washings and aspirates are unacceptable for Xpert Xpress SARS-CoV-2/FLU/RSV testing.  Fact Sheet for Patients: EntrepreneurPulse.com.au  Fact Sheet for Healthcare Providers: IncredibleEmployment.be  This test is not yet approved or cleared by the [place] FDA and has been authorized for detection and/or diagnosis of SARS-CoV-2 by FDA under an Emergency Use Authorization (EUA). This EUA will remain in effect (meaning this test can be used) for the duration of the COVID-19 declaration under Section 564(b)(1) of the Act, 21 U.S.C. section 360bbb-3(b)(1), unless the authorization is terminated or revoked.  Performed at [facility], [address] [place], [place]  [address]   Culture, blood (Routine X 2) w Reflex to ID Panel     Status: None (Preliminary result)   Collection Time: 10/21/21  7:21 AM   Specimen: Right Antecubital  Result Value Ref Range Status   Specimen Description RIGHT ANTECUBITAL  Final   Special Requests   Final    BOTTLES DRAWN AEROBIC AND ANAEROBIC Blood Culture adequate volume   Culture   Final    NO GROWTH 3 DAYS Performed at [facility], [address], [place], [place] [address]    Report Status PENDING  Incomplete  Culture, blood (Routine X 2) w Reflex to ID Panel     Status: None (Preliminary result)   Collection Time: 10/21/21  7:23 AM   Specimen: BLOOD RIGHT ARM  Result Value Ref Range Status   Specimen Description BLOOD RIGHT ARM  Final   Special Requests   Final    BOTTLES DRAWN AEROBIC ONLY Blood Culture adequate volume   Culture   Final    NO GROWTH 3 DAYS Performed at [facility], [address], [place], [place] [address]    Report Status PENDING  Incomplete         Radiology Studies: DG Chest Port 1 View  Result Date: 10/23/2021 CLINICAL DATA:  66 year old female with chronic lung disease. Recent increasing shortness of breath. EXAM: PORTABLE CHEST 1 VIEW COMPARISON:  Chest CTA 10/19/2021 and earlier. FINDINGS: Portable AP upright view at 0922 hours. Reduced lung volumes and upper lung predominant pulmonary fibrosis. Stable lung volumes and ventilation since 10/19/2021. Stable cardiac size and mediastinal contours. Multilevel augmented spinal compression fractures. Tracheal air column within normal limits. Paucity of bowel gas in the upper abdomen. IMPRESSION: Stable chronic lung disease. No superimposed acute findings are identified. Electronically Signed   By: Genevie Ann M.D.   On: 10/23/2021 09:40        Scheduled Meds:  acidophilus  1 capsule Oral Daily   albuterol  2 puff Inhalation QID   vitamin C  500 mg Oral Daily   azithromycin  500 mg Oral Daily   benzonatate  200 mg  Oral TID   dorzolamide  1 drop Both Eyes BID   enoxaparin (LOVENOX) injection  40 mg Subcutaneous QHS   escitalopram  20 mg Oral Daily   folic acid  1 mg Oral Daily   latanoprost  1 drop Right Eye BID   levothyroxine  137 mcg Oral QAC breakfast   lidocaine  1 patch Transdermal Q24H   mometasone-formoterol  2 puff Inhalation BID   multivitamin with minerals  1 tablet Oral Daily   potassium chloride SA  20 mEq Oral BID   predniSONE  50 mg Oral Daily   Treprostinil  72 mcg Inhalation QID   zinc sulfate  220 mg Oral Daily   Continuous Infusions:  albumin human 12.5 g (10/24/21 0957)   remdesivir 100 mg in NS 100 mL Stopped (10/23/21 1206)     LOS: 5 days    Time spent: 25 mins.More than 50% of that time was spent in counseling and/or coordination of care.      Shelly Coss, MD [facility] P1/05/2022, 10:48 AM

## 2021-10-24 NOTE — Progress Notes (Signed)
NAME:  Stacie [name], MRN:  [id number], DOB:  Mar 08, 1956, LOS: 5 ADMISSION DATE:  10/19/2021, CONSULTATION DATE:  10/20/2021 REFERRING MD:  Judd Gaudier MD  CHIEF COMPLAINT: SOB   HPI  66 y.o female with significant PMH of  chronic respiratory failure with hypoxia, Arrhythmia, Asthma, Chronic kidney disease, Depression, GERD, Heart murmur, History of kidney stones, hard of hearing, Hyperthyroidism, Hypothyroidism, IBS, Pneumonia, Pulmonary hypertension, Sarcoidosis, Sleep apnea (CPAP with O2), Stroke(07/2020) presented to the ED with acute onset of worsening dyspnea associated with productive yellow sputum cough, wheezing, chills without fever mild abdominal pain.  Past Medical History  Acute on chronic respiratory failure with hypoxia ([place]), Arrhythmia, Asthma, Chronic kidney disease, Depression, GERD (gastroesophageal reflux disease), Heart murmur, History of kidney stones, HOH (hard of hearing), Hyperthyroidism, Hypothyroidism, IBS (irritable bowel syndrome), Pneumonia, Pulmonary hypertension ([place]), Sarcoid, Sarcoidosis, Seasonal allergies, Sleep apnea (CPAP with O2), Stroke ([place]) (07/2020), and Wears hearing aid in both ears.   10/24/21- patient with acute post COVID19 strep pneumoniae CAP and concomitant acute exacerbation of ILD with sarcoidosis.     Significant Hospital Events   1/3: Admitted for acute hypoxic respiratory failure secondary to COVID-19 with Acute Pneumonia  1/4: PCCM consulted  Consults:  PCCM Podiatry  Procedures:  None  Significant Diagnostic Tests:  1/3: Chest Xray>showed stable extensive chronic findings consistent with sarcoidosis and interval development of mild mild right lung scarring and/or atelectasis. 1/3: CTA Chest>1. No evidence of pulmonary embolism. 2. Stable findings consistent with end-stage sarcoid. 3. 4 mm and 8 mm ill-defined subsolid left lower lobe lung nodules. Non-contrast chest CT at 3-6 months is recommended. If nodules persist and are  stable at that time, consider additional non-contrast chest CT examinations at 2 and 4 years. This recommendation follows the consensus statement: Guidelines for Management of Incidental Pulmonary Nodules Detected on CT Images: From the Fleischner Society 2017; Radiology 2017; 284:228-243. 4. Small hiatal hernia. 5. Evidence of prior vertebroplasty throughout multiple lower thoracic and upper lumbar spine vertebra. 6. Aortic atherosclerosis  Micro Data:  1/3: SARS-CoV-2 PCR> POSITIVE 1/3: Influenza PCR> negative 1/5: Blood culture x2> 1/5: Urine Culture> 1/5: MRSA PCR>>  1/5: Strep pneumo urinary antigen> 1/5: Legionella urinary antigen> 1/5: Mycoplasma pneumonia>  Antimicrobials:  Azithromycin 1/3> Ceftriaxone 1/3>  OBJECTIVE  Blood pressure 140/81, pulse 93, temperature 98.7 F (37.1 C), resp. rate 17, SpO2 98 %.        Intake/Output Summary (Last 24 hours) at 10/24/2021 1402 Last data filed at 10/24/2021 0700 Gross per 24 hour  Intake 250 ml  Output 750 ml  Net -500 ml    There were no vitals filed for this visit.   Physical Examination  GENERAL: 66 year-old critically ill patient lying in the bed with no acute distress.  EYES: Pupils equal, round, reactive to light and accommodation. No scleral icterus. Extraocular muscles intact.  HEENT: Head atraumatic, normocephalic. Oropharynx and nasopharynx clear.  NECK:  Supple, no jugular venous distention. No thyroid enlargement, no tenderness.  LUNGS: Normal breath sounds bilaterally, no wheezing, rales,rhonchi or crepitation. No use of accessory muscles of respiration.  CARDIOVASCULAR: S1, S2 normal. No murmurs, rubs, or gallops.  ABDOMEN: Soft, nontender, nondistended. Bowel sounds present. No organomegaly or mass.  EXTREMITIES: No pedal edema, cyanosis, or clubbing.  NEUROLOGIC: Cranial nerves II through XII are intact.  Muscle strength 5/5 in all extremities. Sensation intact. Gait not checked.  PSYCHIATRIC: The  patient is alert and oriented x 3.  SKIN: No obvious rash, lesion, or ulcer.  Labs/imaging that I havepersonally reviewed  (right click and "Reselect all SmartList Selections" daily)     Labs   CBC: Recent Labs  Lab 10/19/21 2024 10/20/21 0806 10/21/21 1621 10/23/21 0742 10/24/21 0507  WBC 5.0 2.3* 2.2* 2.1* 3.2*  NEUTROABS  --   --  2.1  --   --   HGB 11.6* 10.2* 10.4* 10.9* 11.0*  HCT 37.2 33.0* 33.1* 35.2* 34.8*  MCV 96.9 95.9 94.3 93.6 93.3  PLT 385 356 452* 499* 465*     Basic Metabolic Panel: Recent Labs  Lab 10/20/21 0806 10/21/21 0720 10/22/21 1023 10/23/21 0911 10/23/21 1952 10/24/21 0507  NA 138 144 141 139  --  138  K 4.3 4.6 4.2 4.4  --  4.0  CL 98 98 95* 98  --  97*  CO2 29 36* 36* 34*  --  33*  GLUCOSE 199* 151* 195* 139*  --  111*  BUN 31* 39* 44* 40*  --  37*  CREATININE 1.05* 0.99 0.99 0.87  --  0.79  CALCIUM 8.2* 8.5* 8.5* 8.5*  --  8.5*  MG  --  2.3  --  2.2  --  2.1  PHOS  --  3.1  --  1.8* 3.6 2.8    GFR: Estimated Creatinine Clearance: 74.8 mL/min (by C-G formula based on SCr of 0.79 mg/dL). Recent Labs  Lab 10/20/21 0125 10/20/21 0806 10/21/21 0709 10/21/21 1621 10/23/21 0742 10/24/21 0507  PROCALCITON 0.60  --  0.31  --   --   --   WBC  --  2.3*  --  2.2* 2.1* 3.2*  LATICACIDVEN  --   --  1.3  --   --   --      Liver Function Tests: Recent Labs  Lab 10/21/21 0720 10/23/21 0911 10/24/21 0507  AST _0 ALT 49* 30 29  ALKPHOS 71 54 54  BILITOT 0.6 0.6 0.4  PROT 6.9 6.4* 6.1*  ALBUMIN 2.5* 2.8* 2.9*    No results for input(s): LIPASE, AMYLASE in the last 168 hours. No results for input(s): AMMONIA in the last 168 hours.  ABG    Component Value Date/Time   PHART 7.41 07/16/2020 0841   PCO2ART 52 (H) 07/16/2020 0841   PO2ART 76 (L) 07/16/2020 0841   HCO3 31.7 (H) 10/19/2021 2032   TCO2 30 01/14/2021 1138   ACIDBASEDEF 0.5 07/12/2020 1205   O2SAT 33.6 10/19/2021 2032      Coagulation Profile: No  results for input(s): INR, PROTIME in the last 168 hours.  Cardiac Enzymes: No results for input(s): CKTOTAL, CKMB, CKMBINDEX, TROPONINI in the last 168 hours.  HbA1C: Hgb A1c MFr Bld  Date/Time Value Ref Range Status  07/16/2020 07:06 AM 6.4 (H) 4.8 - 5.6 % Final    Comment:    (NOTE) Pre diabetes:          5.7%-6.4%  Diabetes:              >6.4%  Glycemic control for   <7.0% adults with diabetes   04/20/2017 09:09 AM 6.0 (H) 4.8 - 5.6 % Final    Comment:             Pre-diabetes: 5.7 - 6.4          Diabetes: >6.4          Glycemic control for adults with diabetes: <7.0     CBG: No results for input(s): GLUCAP in the last 168 hours.  Review of  Systems:   10 point ROS done and is negative except for chest discomfort  Past Medical History  She,  has a past medical history of Acute respiratory failure with hypoxia ([place]), Arrhythmia, Asthma, Chronic kidney disease, Depression, GERD (gastroesophageal reflux disease), Heart murmur, History of kidney stones, HOH (hard of hearing), Hyperthyroidism, Hypothyroidism, IBS (irritable bowel syndrome), Pneumonia, Pulmonary hypertension ([place]), Sarcoid, Sarcoidosis, Seasonal allergies, Sleep apnea (CPAP with O2), Stroke ([place]) (07/2020), and Wears hearing aid in both ears.   Surgical History    Past Surgical History:  Procedure Laterality Date   ABDOMINAL HYSTERECTOMY     partial   AMPUTATION Left 01/14/2021   Procedure: AMPUTATION RAY (1ST TOE ) ( 2ND METATARSAL HEAD RESECTION);  Surgeon: Algernon Huxley, MD;  Location: [facility];  Service: Vascular;  Laterality: Left;   CARDIAC CATHETERIZATION  10/18/2018   [facility]   CATARACT EXTRACTION W/PHACO Left 07/31/2019   Procedure: CATARACT EXTRACTION PHACO AND INTRAOCULAR LENS PLACEMENT (IOC) LEFT 00:51.1  17.9%  9.15;  Surgeon: Leandrew Koyanagi, MD;  Location: [facility];  Service: Ophthalmology;  Laterality: Left;  keep this patient second   COLON SURGERY     "colon was fused to  bladder - operated on both"   COLONOSCOPY  09/18/2007   diverticuli, no polyps   COLONOSCOPY  05/26/2010   diverticuli, no polyps   CYSTOSCOPY WITH STENT PLACEMENT Bilateral 07/14/2020   Procedure: CYSTOSCOPY WITH STENT PLACEMENT, RETROPYLOGRAM;  Surgeon: Billey Co, MD;  Location: [facility];  Service: Urology;  Laterality: Bilateral;   CYSTOSCOPY/URETEROSCOPY/HOLMIUM LASER/STENT PLACEMENT Left 02/20/2020   Procedure: CYSTOSCOPY/URETEROSCOPY/LITHOTRIPSY /STENT PLACEMENT;  Surgeon: Hollice Espy, MD;  Location: [facility];  Service: Urology;  Laterality: Left;   CYSTOSCOPY/URETEROSCOPY/HOLMIUM LASER/STENT PLACEMENT Bilateral 10/02/2020   Procedure: CYSTOSCOPY/URETEROSCOPY/HOLMIUM LASER/STENT PLACEMENT;  Surgeon: Billey Co, MD;  Location: [facility];  Service: Urology;  Laterality: Bilateral;   EYE SURGERY     KYPHOPLASTY N/A 05/10/2021   Procedure: KYPHOPLASTY, L2, L3, L4;  Surgeon: Hessie Knows, MD;  Location: [facility];  Service: Orthopedics;  Laterality: N/A;   KYPHOPLASTY N/A 06/25/2021   Procedure: T10, T12, L1 KYPHOPLASTY;  Surgeon: Hessie Knows, MD;  Location: [facility];  Service: Orthopedics;  Laterality: N/A;   PARS PLANA VITRECTOMY Right 05/20/2015   Procedure: PARS PLANA VITRECTOMY WITH 25 GAUGE, laser;  Surgeon: Milus Height, MD;  Location: [facility];  Service: Ophthalmology;  Laterality: Right;   PARTIAL HYSTERECTOMY  1990   TEE WITHOUT CARDIOVERSION N/A 07/27/2020   Procedure: TRANSESOPHAGEAL ECHOCARDIOGRAM (TEE);  Surgeon: Teodoro Spray, MD;  Location: [facility];  Service: Cardiovascular;  Laterality: N/A;   TUBAL LIGATION     WISDOM TOOTH EXTRACTION       Social History   reports that she has never smoked. She has never used smokeless tobacco. She reports that she does not drink alcohol and does not use drugs.   Family History   Her family history includes Allergies in her brother and father; Asthma in her brother and father; Breast cancer in her maternal  grandmother; Colon cancer in her father.   Allergies Allergies  Allergen Reactions   Nitrofurantoin Nausea Only   Plaquenil [Hydroxychloroquine]     Unknown reaction    Tramadol Nausea And Vomiting   Sulfa Antibiotics Rash    As an infant     Home Medications  Prior to Admission medications   Medication Sig Start Date End Date Taking? Authorizing Provider  acetaminophen (TYLENOL) 500 MG tablet Take 500 mg by mouth  every 6 (six) hours as needed for moderate pain or headache.   Yes [provider]  albuterol (PROVENTIL) (2.5 MG/3ML) 0.083% nebulizer solution Inhale 2.5 mg into the lungs every 6 (six) hours as needed for wheezing or shortness of breath. 01/04/21 01/04/22 Yes [provider]  budesonide-formoterol (SYMBICORT) 80-4.5 MCG/ACT inhaler Take 2 puffs first thing in am and then another 2 puffs about 12 hours later. Patient taking differently: Inhale 2 puffs into the lungs in the morning and at bedtime. 08/13/20  Yes Angiulli, Lavon Paganini, PA-C  cefdinir (OMNICEF) 300 MG capsule Take 1 capsule (300 mg total) by mouth 2 (two) times daily for 10 days. 10/13/21 10/23/21 Yes Glean Hess, MD  dorzolamide (TRUSOPT) 2 % ophthalmic solution Place 1 drop into both eyes 2 (two) times daily.   Yes [provider]  escitalopram (LEXAPRO) 20 MG tablet TAKE 1 TABLET BY MOUTH EVERY DAY 08/18/21  Yes Glean Hess, MD  folic acid (FOLVITE) 1 MG tablet Take 1 tablet (1 mg total) by mouth daily. 08/13/20  Yes Angiulli, Lavon Paganini, PA-C  HYDROcodone bit-homatropine (HYCODAN) 5-1.5 MG/5ML syrup Take 5 mLs by mouth every 6 (six) hours as needed for up to 10 days for cough. 10/13/21 10/23/21 Yes Glean Hess, MD  latanoprost (XALATAN) 0.005 % ophthalmic solution Place 1 drop into the right eye 2 (two) times daily.   Yes [provider]  levothyroxine (SYNTHROID) 137 MCG tablet TAKE 1 TABLET (137 MCG TOTAL) BY MOUTH DAILY BEFORE BREAKFAST. Patient taking differently:  Take 137 mcg by mouth daily before breakfast. 12/08/20  Yes Glean Hess, MD  Menthol (ICY HOT) 5 % [facility] Apply 1 patch topically daily as needed (pain).   Yes [provider]  methotrexate (RHEUMATREX) 2.5 MG tablet Take 10 tablets (25 mg total) by mouth every Tuesday. 02/23/21  Yes Elgergawy, Silver Huguenin, MD  Multiple Vitamins-Minerals (MULTIVITAMIN WITH MINERALS) tablet Take 1 tablet by mouth daily.   Yes [provider]  naproxen sodium (ALEVE) 220 MG tablet Take 220 mg by mouth daily as needed (pain).   Yes [provider]  potassium citrate (UROCIT-K) 10 MEQ (1080 MG) SR tablet Take 20 mEq by mouth 2 (two) times daily. 05/31/21  Yes [provider]  predniSONE (DELTASONE) 10 MG tablet Take 19 mg by mouth daily.   Yes [provider]  Probiotic Product (PROBIOTIC PO) Take 1 capsule by mouth daily.   Yes [provider]  Treprostinil (TYVASO) 0.6 MG/ML SOLN Inhale into the lungs See admin instructions. 12 breaths 4 times a day   Yes [provider]  cyclobenzaprine (FLEXERIL) 5 MG tablet Take 5-10 mg by mouth 3 (three) times daily as needed. 07/09/21   [provider]  denosumab (PROLIA) 60 MG/ML SOSY injection Inject 60 mg into the skin every 6 (six) months.    [provider]  HYDROcodone-acetaminophen (NORCO) 5-325 MG tablet Take 1 tablet by mouth every 6 (six) hours as needed for moderate pain. Patient not taking: Reported on 10/19/2021 06/25/21   Hessie Knows, MD  NON FORMULARY CPAP nightly    [provider]  OXYGEN Inhale 2 L into the lungs daily as needed (During walking and activity).     [provider]  Scheduled Meds:  acidophilus  1 capsule Oral Daily   albuterol  2 puff Inhalation QID   vitamin C  500 mg Oral Daily   azithromycin  500 mg Oral Daily   benzonatate  200  mg Oral TID   dorzolamide  1 drop Both Eyes BID   enoxaparin (LOVENOX) injection  40 mg Subcutaneous QHS   escitalopram   20 mg Oral Daily   folic acid  1 mg Oral Daily   furosemide  40 mg Intravenous Daily   latanoprost  1 drop Right Eye BID   levothyroxine  137 mcg Oral QAC breakfast   lidocaine  1 patch Transdermal Q24H   mometasone-formoterol  2 puff Inhalation BID   multivitamin with minerals  1 tablet Oral Daily   potassium chloride SA  20 mEq Oral BID   [START ON 10/25/2021] predniSONE  40 mg Oral Daily   Treprostinil  72 mcg Inhalation QID   zinc sulfate  220 mg Oral Daily   Continuous Infusions:   PRN Meds:.acetaminophen **OR** acetaminophen, cyclobenzaprine, magnesium hydroxide, metoprolol tartrate, Muscle Rub, ondansetron **OR** ondansetron (ZOFRAN) IV, traZODone      Assessment & Plan:  Acute on Chronic Hypoxic Respiratory Failure secondary to CAP strep pneumoniae post COVID-19 Pneumonia  AND acute exacerbation of ILD with pulmonary sarcoidosis PMHx: ILD, interstitial lung disease on chronic home O2, sarcoidosis, asthma -Supplemental O2 as needed to maintain O2 saturations 88 to 92% -Follow intermittent ABG and chest x-ray as needed -Continue remdesivir x5 days  -Rocephin zithromax  -COVID protocol - reduced steroids to 40 pred daily  -inflammatory biomarkers to be continued -IS/PT/OT   Leukopenia    Supspect lymphopenia due to COVID and relative WBC suppresion in context of infection/lower extermity    Pulmonary HTN - ILD associated PH due to Sarcoidosis    - continue home [place] QID    - c/w steroids , now patient is on PO pred only  -repeat CXR - 1/7 /22  Pulmonary comorbid conditions   OSA/sarcoidosis    - continue supportive care with CPAP   - steroids    - PT/OT  Sepsis due to osteomyelitis of left toe -RULED OUT     AKI on CKD Stage III- IMPROVED  -Monitor I&O's / urinary output -Follow BMP -Ensure adequate renal perfusion -Avoid nephrotoxic agents as able -Replace electrolytes as indicated   Hypothyroidism -Continue Synthroid  Best practice:  Diet:   Oral Pain/Anxiety/Delirium protocol (if indicated): Yes (RASS goal 0) VAP protocol (if indicated): Not indicated DVT prophylaxis: LMWH GI prophylaxis: PPI Glucose control:  SSI Yes Central venous access:  N/A Arterial line:  N/A Foley:  no Mobility:  OOB  PT consulted: Yes Last date of multidisciplinary goals of care discussion [full code] Code Status:  DNR Disposition: SDU   = Goals of Care = Code Status Order: _0 @   Primary Emergency Contact: Homer Wishes to pursue full aggressive treatment and intervention options, including CPR and intubation, but goals of care will be addressed on going with family if that should become necessary.   Ottie Glazier, M.D.  Pulmonary & Critical Care Medicine     .

## 2021-10-25 ENCOUNTER — Inpatient Hospital Stay: Payer: BC Managed Care – PPO

## 2021-10-25 DIAGNOSIS — J9601 Acute respiratory failure with hypoxia: Secondary | ICD-10-CM | POA: Diagnosis not present

## 2021-10-25 LAB — C-REACTIVE PROTEIN: CRP: 1.4 mg/dL — ABNORMAL HIGH (ref <1.0)

## 2021-10-25 LAB — FERRITIN: Ferritin: 220 ng/mL (ref 11–307)

## 2021-10-25 LAB — D-DIMER, QUANTITATIVE: D-Dimer, Quant: 1.72 ug/mL-FEU — ABNORMAL HIGH (ref 0.00–0.50)

## 2021-10-25 MED ORDER — FUROSEMIDE 40 MG PO TABS
40.0000 mg | ORAL_TABLET | Freq: Every day | ORAL | Status: DC
  Administered 2021-10-26: 40 mg via ORAL
  Filled 2021-10-25: qty 1

## 2021-10-25 MED ORDER — ALBUTEROL SULFATE (2.5 MG/3ML) 0.083% IN NEBU: 2.5000 mg | INHALATION_SOLUTION | Freq: Once | RESPIRATORY_TRACT | Status: DC

## 2021-10-25 MED ORDER — ALBUTEROL SULFATE (2.5 MG/3ML) 0.083% IN NEBU
2.5000 mg | INHALATION_SOLUTION | RESPIRATORY_TRACT | Status: DC | PRN
  Filled 2021-10-25: qty 3

## 2021-10-25 NOTE — Progress Notes (Signed)
Pt is on home CPAP and asleep. Pt does not want to do her Metaneb until late morning. Pt states it is terrible. Metaneb remains at bedside.Pt is in no visible distress.

## 2021-10-25 NOTE — Progress Notes (Signed)
NAME:  Stacie [name], MRN:  [id number], DOB:  21-Mar-1956, LOS: 6 ADMISSION DATE:  10/19/2021, CONSULTATION DATE:  10/20/2021 REFERRING MD:  Judd Gaudier MD  CHIEF COMPLAINT: SOB   HPI  66 y.o female with significant PMH of  chronic respiratory failure with hypoxia, Arrhythmia, Asthma, Chronic kidney disease, Depression, GERD, Heart murmur, History of kidney stones, hard of hearing, Hyperthyroidism, Hypothyroidism, IBS, Pneumonia, Pulmonary hypertension, Sarcoidosis, Sleep apnea (CPAP with O2), Stroke(07/2020) presented to the ED with acute onset of worsening dyspnea associated with productive yellow sputum cough, wheezing, chills without fever mild abdominal pain.  Past Medical History  Acute on chronic respiratory failure with hypoxia ([place]), Arrhythmia, Asthma, Chronic kidney disease, Depression, GERD (gastroesophageal reflux disease), Heart murmur, History of kidney stones, HOH (hard of hearing), Hyperthyroidism, Hypothyroidism, IBS (irritable bowel syndrome), Pneumonia, Pulmonary hypertension ([place]), Sarcoid, Sarcoidosis, Seasonal allergies, Sleep apnea (CPAP with O2), Stroke ([place]) (07/2020), and Wears hearing aid in both ears.   10/24/21- patient with acute post COVID19 strep pneumoniae CAP and concomitant acute exacerbation of ILD with sarcoidosis.     10/25/21- patient is improved she may follow up on outpatient basis post DC home.  CXR repeat with no new findings  Significant Hospital Events   1/3: Admitted for acute hypoxic respiratory failure secondary to COVID-19 with Acute Pneumonia  1/4: PCCM consulted  Consults:  PCCM Podiatry  Procedures:  None  Significant Diagnostic Tests:  1/3: Chest Xray>showed stable extensive chronic findings consistent with sarcoidosis and interval development of mild mild right lung scarring and/or atelectasis. 1/3: CTA Chest>1. No evidence of pulmonary embolism. 2. Stable findings consistent with end-stage sarcoid. 3. 4 mm and 8 mm ill-defined  subsolid left lower lobe lung nodules. Non-contrast chest CT at 3-6 months is recommended. If nodules persist and are stable at that time, consider additional non-contrast chest CT examinations at 2 and 4 years. This recommendation follows the consensus statement: Guidelines for Management of Incidental Pulmonary Nodules Detected on CT Images: From the Fleischner Society 2017; Radiology 2017; 284:228-243. 4. Small hiatal hernia. 5. Evidence of prior vertebroplasty throughout multiple lower thoracic and upper lumbar spine vertebra. 6. Aortic atherosclerosis  Micro Data:  1/3: SARS-CoV-2 PCR> POSITIVE 1/3: Influenza PCR> negative 1/5: Blood culture x2> 1/5: Urine Culture> 1/5: MRSA PCR>>  1/5: Strep pneumo urinary antigen> 1/5: Legionella urinary antigen> 1/5: Mycoplasma pneumonia>  Antimicrobials:  Azithromycin 1/3> Ceftriaxone 1/3>  OBJECTIVE  Blood pressure 128/77, pulse 100, temperature 97.8 F (36.6 C), temperature source Oral, resp. rate 20, SpO2 98 %.        Intake/Output Summary (Last 24 hours) at 10/25/2021 8502 Last data filed at 10/24/2021 1845 Gross per 24 hour  Intake 40.7 ml  Output 1000 ml  Net -959.3 ml    There were no vitals filed for this visit.   Physical Examination  GENERAL: 66 year-old critically ill patient lying in the bed with no acute distress.  EYES: Pupils equal, round, reactive to light and accommodation. No scleral icterus. Extraocular muscles intact.  HEENT: Head atraumatic, normocephalic. Oropharynx and nasopharynx clear.  NECK:  Supple, no jugular venous distention. No thyroid enlargement, no tenderness.  LUNGS: Normal breath sounds bilaterally, no wheezing, rales,rhonchi or crepitation. No use of accessory muscles of respiration.  CARDIOVASCULAR: S1, S2 normal. No murmurs, rubs, or gallops.  ABDOMEN: Soft, nontender, nondistended. Bowel sounds present. No organomegaly or mass.  EXTREMITIES: No pedal edema, cyanosis, or clubbing.   NEUROLOGIC: Cranial nerves II through XII are intact.  Muscle strength 5/5 in  all extremities. Sensation intact. Gait not checked.  PSYCHIATRIC: The patient is alert and oriented x 3.  SKIN: No obvious rash, lesion, or ulcer.   Labs/imaging that I havepersonally reviewed  (right click and "Reselect all SmartList Selections" daily)     Labs   CBC: Recent Labs  Lab 10/19/21 2024 10/20/21 0806 10/21/21 1621 10/23/21 0742 10/24/21 0507  WBC 5.0 2.3* 2.2* 2.1* 3.2*  NEUTROABS  --   --  2.1  --   --   HGB 11.6* 10.2* 10.4* 10.9* 11.0*  HCT 37.2 33.0* 33.1* 35.2* 34.8*  MCV 96.9 95.9 94.3 93.6 93.3  PLT 385 356 452* 499* 465*     Basic Metabolic Panel: Recent Labs  Lab 10/20/21 0806 10/21/21 0720 10/22/21 1023 10/23/21 0911 10/23/21 1952 10/24/21 0507  NA 138 144 141 139  --  138  K 4.3 4.6 4.2 4.4  --  4.0  CL 98 98 95* 98  --  97*  CO2 29 36* 36* 34*  --  33*  GLUCOSE 199* 151* 195* 139*  --  111*  BUN 31* 39* 44* 40*  --  37*  CREATININE 1.05* 0.99 0.99 0.87  --  0.79  CALCIUM 8.2* 8.5* 8.5* 8.5*  --  8.5*  MG  --  2.3  --  2.2  --  2.1  PHOS  --  3.1  --  1.8* 3.6 2.8    GFR: Estimated Creatinine Clearance: 74.8 mL/min (by C-G formula based on SCr of 0.79 mg/dL). Recent Labs  Lab 10/20/21 0125 10/20/21 0806 10/21/21 0709 10/21/21 1621 10/23/21 0742 10/24/21 0507  PROCALCITON 0.60  --  0.31  --   --   --   WBC  --  2.3*  --  2.2* 2.1* 3.2*  LATICACIDVEN  --   --  1.3  --   --   --      Liver Function Tests: Recent Labs  Lab 10/21/21 0720 10/23/21 0911 10/24/21 0507  AST _0 ALT 49* 30 29  ALKPHOS 71 54 54  BILITOT 0.6 0.6 0.4  PROT 6.9 6.4* 6.1*  ALBUMIN 2.5* 2.8* 2.9*    No results for input(s): LIPASE, AMYLASE in the last 168 hours. No results for input(s): AMMONIA in the last 168 hours.  ABG    Component Value Date/Time   PHART 7.41 07/16/2020 0841   PCO2ART 52 (H) 07/16/2020 0841   PO2ART 76 (L) 07/16/2020 0841   HCO3 31.7  (H) 10/19/2021 2032   TCO2 30 01/14/2021 1138   ACIDBASEDEF 0.5 07/12/2020 1205   O2SAT 33.6 10/19/2021 2032      Coagulation Profile: No results for input(s): INR, PROTIME in the last 168 hours.  Cardiac Enzymes: No results for input(s): CKTOTAL, CKMB, CKMBINDEX, TROPONINI in the last 168 hours.  HbA1C: Hgb A1c MFr Bld  Date/Time Value Ref Range Status  07/16/2020 07:06 AM 6.4 (H) 4.8 - 5.6 % Final    Comment:    (NOTE) Pre diabetes:          5.7%-6.4%  Diabetes:              >6.4%  Glycemic control for   <7.0% adults with diabetes   04/20/2017 09:09 AM 6.0 (H) 4.8 - 5.6 % Final    Comment:             Pre-diabetes: 5.7 - 6.4          Diabetes: >6.4  Glycemic control for adults with diabetes: <7.0     CBG: No results for input(s): GLUCAP in the last 168 hours.  Review of Systems:   10 point ROS done and is negative except for chest discomfort  Past Medical History  She,  has a past medical history of Acute respiratory failure with hypoxia ([place]), Arrhythmia, Asthma, Chronic kidney disease, Depression, GERD (gastroesophageal reflux disease), Heart murmur, History of kidney stones, HOH (hard of hearing), Hyperthyroidism, Hypothyroidism, IBS (irritable bowel syndrome), Pneumonia, Pulmonary hypertension ([place]), Sarcoid, Sarcoidosis, Seasonal allergies, Sleep apnea (CPAP with O2), Stroke ([place]) (07/2020), and Wears hearing aid in both ears.   Surgical History    Past Surgical History:  Procedure Laterality Date   ABDOMINAL HYSTERECTOMY     partial   AMPUTATION Left 01/14/2021   Procedure: AMPUTATION RAY (1ST TOE ) ( 2ND METATARSAL HEAD RESECTION);  Surgeon: Algernon Huxley, MD;  Location: [facility];  Service: Vascular;  Laterality: Left;   CARDIAC CATHETERIZATION  10/18/2018   [facility]   CATARACT EXTRACTION W/PHACO Left 07/31/2019   Procedure: CATARACT EXTRACTION PHACO AND INTRAOCULAR LENS PLACEMENT (IOC) LEFT 00:51.1  17.9%  9.15;  Surgeon: Leandrew Koyanagi, MD;   Location: [facility];  Service: Ophthalmology;  Laterality: Left;  keep this patient second   COLON SURGERY     "colon was fused to bladder - operated on both"   COLONOSCOPY  09/18/2007   diverticuli, no polyps   COLONOSCOPY  05/26/2010   diverticuli, no polyps   CYSTOSCOPY WITH STENT PLACEMENT Bilateral 07/14/2020   Procedure: CYSTOSCOPY WITH STENT PLACEMENT, RETROPYLOGRAM;  Surgeon: Billey Co, MD;  Location: [facility];  Service: Urology;  Laterality: Bilateral;   CYSTOSCOPY/URETEROSCOPY/HOLMIUM LASER/STENT PLACEMENT Left 02/20/2020   Procedure: CYSTOSCOPY/URETEROSCOPY/LITHOTRIPSY /STENT PLACEMENT;  Surgeon: Hollice Espy, MD;  Location: [facility];  Service: Urology;  Laterality: Left;   CYSTOSCOPY/URETEROSCOPY/HOLMIUM LASER/STENT PLACEMENT Bilateral 10/02/2020   Procedure: CYSTOSCOPY/URETEROSCOPY/HOLMIUM LASER/STENT PLACEMENT;  Surgeon: Billey Co, MD;  Location: [facility];  Service: Urology;  Laterality: Bilateral;   EYE SURGERY     KYPHOPLASTY N/A 05/10/2021   Procedure: KYPHOPLASTY, L2, L3, L4;  Surgeon: Hessie Knows, MD;  Location: [facility];  Service: Orthopedics;  Laterality: N/A;   KYPHOPLASTY N/A 06/25/2021   Procedure: T10, T12, L1 KYPHOPLASTY;  Surgeon: Hessie Knows, MD;  Location: [facility];  Service: Orthopedics;  Laterality: N/A;   PARS PLANA VITRECTOMY Right 05/20/2015   Procedure: PARS PLANA VITRECTOMY WITH 25 GAUGE, laser;  Surgeon: Milus Height, MD;  Location: [facility];  Service: Ophthalmology;  Laterality: Right;   PARTIAL HYSTERECTOMY  1990   TEE WITHOUT CARDIOVERSION N/A 07/27/2020   Procedure: TRANSESOPHAGEAL ECHOCARDIOGRAM (TEE);  Surgeon: Teodoro Spray, MD;  Location: [facility];  Service: Cardiovascular;  Laterality: N/A;   TUBAL LIGATION     WISDOM TOOTH EXTRACTION       Social History   reports that she has never smoked. She has never used smokeless tobacco. She reports that she does not drink alcohol and does not use drugs.   Family History    Her family history includes Allergies in her brother and father; Asthma in her brother and father; Breast cancer in her maternal grandmother; Colon cancer in her father.   Allergies Allergies  Allergen Reactions   Nitrofurantoin Nausea Only   Plaquenil [Hydroxychloroquine]     Unknown reaction    Tramadol Nausea And Vomiting   Sulfa Antibiotics Rash    As an infant     Home Medications  Prior  to Admission medications   Medication Sig Start Date End Date Taking? Authorizing Provider  acetaminophen (TYLENOL) 500 MG tablet Take 500 mg by mouth every 6 (six) hours as needed for moderate pain or headache.   Yes [provider]  albuterol (PROVENTIL) (2.5 MG/3ML) 0.083% nebulizer solution Inhale 2.5 mg into the lungs every 6 (six) hours as needed for wheezing or shortness of breath. 01/04/21 01/04/22 Yes [provider]  budesonide-formoterol (SYMBICORT) 80-4.5 MCG/ACT inhaler Take 2 puffs first thing in am and then another 2 puffs about 12 hours later. Patient taking differently: Inhale 2 puffs into the lungs in the morning and at bedtime. 08/13/20  Yes Angiulli, Lavon Paganini, PA-C  cefdinir (OMNICEF) 300 MG capsule Take 1 capsule (300 mg total) by mouth 2 (two) times daily for 10 days. 10/13/21 10/23/21 Yes Glean Hess, MD  dorzolamide (TRUSOPT) 2 % ophthalmic solution Place 1 drop into both eyes 2 (two) times daily.   Yes [provider]  escitalopram (LEXAPRO) 20 MG tablet TAKE 1 TABLET BY MOUTH EVERY DAY 08/18/21  Yes Glean Hess, MD  folic acid (FOLVITE) 1 MG tablet Take 1 tablet (1 mg total) by mouth daily. 08/13/20  Yes Angiulli, Lavon Paganini, PA-C  HYDROcodone bit-homatropine (HYCODAN) 5-1.5 MG/5ML syrup Take 5 mLs by mouth every 6 (six) hours as needed for up to 10 days for cough. 10/13/21 10/23/21 Yes Glean Hess, MD  latanoprost (XALATAN) 0.005 % ophthalmic solution Place 1 drop into the right eye 2 (two) times daily.   Yes [provider]   levothyroxine (SYNTHROID) 137 MCG tablet TAKE 1 TABLET (137 MCG TOTAL) BY MOUTH DAILY BEFORE BREAKFAST. Patient taking differently: Take 137 mcg by mouth daily before breakfast. 12/08/20  Yes Glean Hess, MD  Menthol (ICY HOT) 5 % [facility] Apply 1 patch topically daily as needed (pain).   Yes [provider]  methotrexate (RHEUMATREX) 2.5 MG tablet Take 10 tablets (25 mg total) by mouth every Tuesday. 02/23/21  Yes Elgergawy, Silver Huguenin, MD  Multiple Vitamins-Minerals (MULTIVITAMIN WITH MINERALS) tablet Take 1 tablet by mouth daily.   Yes [provider]  naproxen sodium (ALEVE) 220 MG tablet Take 220 mg by mouth daily as needed (pain).   Yes [provider]  potassium citrate (UROCIT-K) 10 MEQ (1080 MG) SR tablet Take 20 mEq by mouth 2 (two) times daily. 05/31/21  Yes [provider]  predniSONE (DELTASONE) 10 MG tablet Take 19 mg by mouth daily.   Yes [provider]  Probiotic Product (PROBIOTIC PO) Take 1 capsule by mouth daily.   Yes [provider]  Treprostinil (TYVASO) 0.6 MG/ML SOLN Inhale into the lungs See admin instructions. 12 breaths 4 times a day   Yes [provider]  cyclobenzaprine (FLEXERIL) 5 MG tablet Take 5-10 mg by mouth 3 (three) times daily as needed. 07/09/21   [provider]  denosumab (PROLIA) 60 MG/ML SOSY injection Inject 60 mg into the skin every 6 (six) months.    [provider]  HYDROcodone-acetaminophen (NORCO) 5-325 MG tablet Take 1 tablet by mouth every 6 (six) hours as needed for moderate pain. Patient not taking: Reported on 10/19/2021 06/25/21   Hessie Knows, MD  NON FORMULARY CPAP nightly    [provider]  OXYGEN Inhale 2 L into the lungs daily as needed (During walking and activity).     [provider]  Scheduled Meds:  acidophilus  1 capsule Oral Daily   albuterol  2  puff Inhalation QID   vitamin C  500 mg Oral Daily   azithromycin  500 mg Oral Daily    benzonatate  200 mg Oral TID   dorzolamide  1 drop Both Eyes BID   enoxaparin (LOVENOX) injection  40 mg Subcutaneous QHS   escitalopram  20 mg Oral Daily   folic acid  1 mg Oral Daily   furosemide  40 mg Intravenous Daily   latanoprost  1 drop Right Eye BID   levothyroxine  137 mcg Oral QAC breakfast   lidocaine  1 patch Transdermal Q24H   mometasone-formoterol  2 puff Inhalation BID   multivitamin with minerals  1 tablet Oral Daily   potassium chloride SA  20 mEq Oral BID   predniSONE  40 mg Oral Daily   Treprostinil  72 mcg Inhalation QID   zinc sulfate  220 mg Oral Daily   Continuous Infusions:   PRN Meds:.acetaminophen **OR** acetaminophen, cyclobenzaprine, magnesium hydroxide, metoprolol tartrate, Muscle Rub, ondansetron **OR** ondansetron (ZOFRAN) IV, traZODone      Assessment & Plan:  Acute on Chronic Hypoxic Respiratory Failure secondary to CAP strep pneumoniae post COVID-19 Pneumonia  AND acute exacerbation of ILD with pulmonary sarcoidosis PMHx: ILD, interstitial lung disease on chronic home O2, sarcoidosis, asthma -Supplemental O2 as needed to maintain O2 saturations 88 to 92% -Follow intermittent ABG and chest x-ray as needed -Continue remdesivir x5 days  -Rocephin zithromax  -COVID protocol - reduced steroids to 40 pred daily  -inflammatory biomarkers to be continued -IS/PT/OT   Leukopenia    Supspect lymphopenia due to COVID and relative WBC suppresion in context of infection/lower extermity    Pulmonary HTN - ILD associated PH due to Sarcoidosis    - continue home [place] QID    - c/w steroids , now patient is on PO pred only  -repeat CXR - 1/7 /22  Pulmonary comorbid conditions   OSA/sarcoidosis    - continue supportive care with CPAP   - steroids    - PT/OT  Sepsis due to osteomyelitis of left toe -RULED OUT     AKI on CKD Stage III- IMPROVED  -Monitor I&O's / urinary output -Follow BMP -Ensure adequate renal perfusion -Avoid nephrotoxic  agents as able -Replace electrolytes as indicated   Hypothyroidism -Continue Synthroid  Best practice:  Diet:  Oral Pain/Anxiety/Delirium protocol (if indicated): Yes (RASS goal 0) VAP protocol (if indicated): Not indicated DVT prophylaxis: LMWH GI prophylaxis: PPI Glucose control:  SSI Yes Central venous access:  N/A Arterial line:  N/A Foley:  no Mobility:  OOB  PT consulted: Yes Last date of multidisciplinary goals of care discussion [full code] Code Status:  DNR Disposition: SDU   = Goals of Care = Code Status Order: _0 @   Primary Emergency Contact: [place] Wishes to pursue full aggressive treatment and intervention options, including CPR and intubation, but goals of care will be addressed on going with family if that should become necessary.   Ottie Glazier, M.D.  Pulmonary & Critical Care Medicine     .

## 2021-10-25 NOTE — Progress Notes (Signed)
Physical Therapy Treatment Patient Details Name: Stacie [name] MRN: [id number] DOB: 10-02-1956 Today's Date: 10/25/2021   History of Present Illness Pt is a 66 y/o F admitted on 10/19/21 after presenting to the ED with acute onset of worsening dyspnea. Pt found to be covid (+). Pt is being treated for acute hypoxic respiratory failure 2/2 covid 19 infection & asthma exacerbation. PMH: GERD, hypothyroidism, sarcoidosis, sleep apnea, CVA, asthma, depression, urolithiasis    PT Comments    Pt seen for PT tx with daughter present for session. Pt received on 4L/min but tolerated being titrated down to 2L/min & left on 2L/min at end of session - nurse aware. Pt is able to complete bed mobility with supervision & sit>stand transfers with CGA<>min assist. Pt ambulates short distance in room with RW & CGA but is limited by fatigue & nausea after mobility. Notified nurse & with plans to f/u on orthostatic vital signs as daughter reports pt has a hx of this.    Recommendations for follow up therapy are one component of a multi-disciplinary discharge planning process, led by the attending physician.  Recommendations may be updated based on patient status, additional functional criteria and insurance authorization.  Follow Up Recommendations  Home health PT     Assistance Recommended at Discharge Frequent or constant Supervision/Assistance  Patient can return home with the following A little help with walking and/or transfers;A little help with bathing/dressing/bathroom;Assistance with cooking/housework;Assist for transportation;Help with stairs or ramp for entrance   Equipment Recommendations  None recommended by PT    Recommendations for Other Services       Precautions / Restrictions Precautions Precautions: Fall Restrictions Weight Bearing Restrictions: No     Mobility  Bed Mobility Overal bed mobility: Needs Assistance Bed Mobility: Supine to Sit;Sit to Supine     Supine to sit:  Supervision;HOB elevated Sit to supine: Supervision;HOB elevated        Transfers Overall transfer level: Needs assistance Equipment used: Rolling walker (2 wheels) Transfers: Sit to/from Stand Sit to Stand: Min guard                Ambulation/Gait Ambulation/Gait assistance: Min guard Gait Distance (Feet): 11 Feet Assistive device: Rolling walker (2 wheels) Gait Pattern/deviations: Decreased step length - left;Decreased step length - right;Decreased stride length Gait velocity: decreased         Stairs             Wheelchair Mobility    Modified Rankin (Stroke Patients Only)       Balance Overall balance assessment: Needs assistance;Independent Sitting-balance support: Feet supported;No upper extremity supported Sitting balance-Leahy Scale: Good Sitting balance - Comments: supervision static sitting   Standing balance support: Bilateral upper extremity supported;During functional activity Standing balance-Leahy Scale: Fair                              Cognition Arousal/Alertness: Awake/alert Behavior During Therapy: WFL for tasks assessed/performed Overall Cognitive Status: Within Functional Limits for tasks assessed                                          Exercises General Exercises - Lower Extremity Long Arc Quad: AROM;Strengthening;Both;10 reps;Seated    General Comments General comments (skin integrity, edema, etc.): Pt on 4L/min via nasal cannula & tolerated titraing down to 2L/min with SPO2 >90%. Pt  does endorse nausea after gait & BP 126/72 mmHg (MAP 88). Pt's daughter present & reports pt has a hx of orthostatic hypotension - notified nurse & plan to f/u on orthostatics.      Pertinent Vitals/Pain Pain Assessment: No/denies pain    Home Living                          Prior Function            PT Goals (current goals can now be found in the care plan section) Acute Rehab PT Goals Patient  Stated Goal: get better Time For Goal Achievement: 11/05/21 Potential to Achieve Goals: Good Progress towards PT goals: Progressing toward goals    Frequency    Min 2X/week      PT Plan Current plan remains appropriate    Co-evaluation              AM-PAC PT "6 Clicks" Mobility   Outcome Measure  Help needed turning from your back to your side while in a flat bed without using bedrails?: None Help needed moving from lying on your back to sitting on the side of a flat bed without using bedrails?: None Help needed moving to and from a bed to a chair (including a wheelchair)?: A Little Help needed standing up from a chair using your arms (e.g., wheelchair or bedside chair)?: A Little Help needed to walk in hospital room?: A Little Help needed climbing 3-5 steps with a railing? : A Lot 6 Click Score: 19    End of Session Equipment Utilized During Treatment: Gait belt;Oxygen Activity Tolerance: Patient tolerated treatment well (limited by nausea, fatigue) Patient left: in bed;with bed alarm set;with family/visitor present;with call bell/phone within reach Nurse Communication: Mobility status (titrated down to 2L/min, c/o nausea & request for anti-nausea medication, need to check orthostatic vital signs) PT Visit Diagnosis: Muscle weakness (generalized) (M62.81);Unsteadiness on feet (R26.81)     Time: 8101-7510 PT Time Calculation (min) (ACUTE ONLY): 23 min  Charges:  $Therapeutic Activity: 23-37 mins                     Lavone Nian, PT, DPT 10/25/21, 3:29 PM    Waunita Schooner 10/25/2021, 3:28 PM

## 2021-10-25 NOTE — Progress Notes (Incomplete)
PROGRESS NOTE    Stacie [name]  YHC:[id number] DOB: 10/22/1955 DOA: 10/19/2021 PCP: Glean Hess, MD   Chief Complain: Shortness of breath  Brief Narrative: Patient is a 66 year old female with history of GERD, hypothyroidism, sarcoidosis, sleep apnea , CVA, asthma, chronic hypoxia on 2 L of oxygen at home, the patient who presented to the ER with complaints of worsening dyspnea, cough, wheezing.  On presentation, COVID screening test came out to be positive.  She required BiPAP on presentation.  Started on remdesivir, IV Solu-Medrol, IV antibiotics.  Still on high oxygen requirement, currently on 8 L/min.  Goal is to discharge to home with home with home health after improvement in the oxygen requirement  Assessment & Plan:   Principal Problem:   ARF (acute respiratory failure) ([place]) Active Problems:   Acute respiratory failure ([place])   Acute on chronic hypoxic respiratory failure: Secondary to COVID infection, could also be contributed by asthma exacerbation.Also has H/O of sarcoidosis.  She is on oxygen at 2 L at home.  Chest x-ray on admission showed bilateral pulmonary infiltrates greater on the left :started on remdesivir( day 4/5), IV antibiotics empirically, IV steroids.  Steroid switched to prednisone.  On vitamin C, folic acid.  On presentation she had to be put on BiPAP. Currently on 8 L of oxygen per minute.  We will continue to wean the oxygen.  She completed course of ceftriaxone.  Continue mucolytic's, spirometer, flutter valve. He might need greater oxygen requirement than her baseline on discharge.  COVID-pneumonia: On presentation she was hypoxic, chest ray showed bilateral infiltrates more on the left.  Continue current management.  She will finish the course of remdesivir tomorrow. Strep pneumoniae urine antigen was positive.  She was on  ceftriaxone, azithromycin  Left forefoot ulceration: Status post first and second partial ray resection.  Ulcer present on  admission.  Podiatry was following here.  Continue wound care on discharge.  History of sleep apnea: Uses CPAP at home  Rheumatoid arthritis: On methotrexate as an outpatient.  Hypothyroidism: On Synthyroid  Depression: on Lexapro  Deconditioning/debility: PT/OT recommend home health on discharge.            DVT prophylaxis:Lovenox Code Status: Full Family Communication: daughter at bedside Patient status:Inpatient  Dispo: The patient is from: Home              Anticipated d/c is to: Home              Anticipated d/c date is:In next 1-2 days   Consultants: PCCM  Procedures:None  Antimicrobials:  Anti-infectives (From admission, onward)    Start     Dose/Rate Route Frequency Ordered Stop   10/24/21 1000  azithromycin (ZITHROMAX) tablet 500 mg        500 mg Oral Daily 10/23/21 1156 10/26/21 0959   10/22/21 1000  remdesivir 100 mg in sodium chloride 0.9 % 100 mL IVPB  Status:  Discontinued       See Hyperspace for full Linked Orders Report.   100 mg 200 mL/hr over 30 Minutes Intravenous Daily 10/21/21 1226 10/24/21 1114   10/21/21 1230  remdesivir 200 mg in sodium chloride 0.9% 250 mL IVPB       See Hyperspace for full Linked Orders Report.   200 mg 580 mL/hr over 30 Minutes Intravenous Once 10/21/21 1226 10/21/21 1314   10/21/21 1000  remdesivir 100 mg in sodium chloride 0.9 % 100 mL IVPB  Status:  Discontinued  See Hyperspace for full Linked Orders Report.   100 mg 200 mL/hr over 30 Minutes Intravenous Daily 10/20/21 0336 10/21/21 1226   10/21/21 0800  azithromycin (ZITHROMAX) 500 mg in sodium chloride 0.9 % 250 mL IVPB  Status:  Discontinued        500 mg 250 mL/hr over 60 Minutes Intravenous Every 24 hours 10/21/21 0626 10/23/21 1156   10/20/21 0500  remdesivir 200 mg in sodium chloride 0.9% 250 mL IVPB  Status:  Discontinued       See Hyperspace for full Linked Orders Report.   200 mg 580 mL/hr over 30 Minutes Intravenous Once 10/20/21 0336 10/21/21  1226   10/20/21 0000  cefTRIAXone (ROCEPHIN) 1 g in sodium chloride 0.9 % 100 mL IVPB        1 g 200 mL/hr over 30 Minutes Intravenous Daily at bedtime 10/19/21 2328 10/23/21 2138       Subjective:  Patient seen and examined at the bedside this morning.  Hemodynamically stable lying in bed.  Daughter at the bedside.  She was on 8 L of oxygen per minute.  Very weak.  Denies any worsening shortness of breath or cough.   Objective: Vitals:   10/24/21 1454 10/24/21 2100 10/24/21 2208 10/25/21 0344  BP: (!) 121/50  124/80 125/83  Pulse: 85  95 90  Resp: 19     Temp: 98.4 F (36.9 C)  98.7 F (37.1 C) 97.6 F (36.4 C)  TempSrc: Oral     SpO2: 100% 100% 99% 99%    Intake/Output Summary (Last 24 hours) at 10/25/2021 0826 Last data filed at 10/24/2021 1845 Gross per 24 hour  Intake 40.7 ml  Output 1000 ml  Net -959.3 ml   There were no vitals filed for this visit.  Examination:  General exam: Overall comfortable, not in distress HEENT: PERRL Respiratory system:  no wheezes or crackles  Cardiovascular system: S1 & S2 heard, RRR.  Gastrointestinal system: Abdomen is nondistended, soft and nontender. Central nervous system: Alert and oriented Extremities: No edema, no clubbing ,no cyanosis Skin: No rashes, no ulcers,no icterus      Data Reviewed: I have personally reviewed following labs and imaging studies  CBC: Recent Labs  Lab 10/19/21 2024 10/20/21 0806 10/21/21 1621 10/23/21 0742 10/24/21 0507  WBC 5.0 2.3* 2.2* 2.1* 3.2*  NEUTROABS  --   --  2.1  --   --   HGB 11.6* 10.2* 10.4* 10.9* 11.0*  HCT 37.2 33.0* 33.1* 35.2* 34.8*  MCV 96.9 95.9 94.3 93.6 93.3  PLT 385 356 452* 499* 081*   Basic Metabolic Panel: Recent Labs  Lab 10/20/21 0806 10/21/21 0720 10/22/21 1023 10/23/21 0911 10/23/21 1952 10/24/21 0507  NA 138 144 141 139  --  138  K 4.3 4.6 4.2 4.4  --  4.0  CL 98 98 95* 98  --  97*  CO2 29 36* 36* 34*  --  33*  GLUCOSE 199* 151* 195* 139*  --   111*  BUN 31* 39* 44* 40*  --  37*  CREATININE 1.05* 0.99 0.99 0.87  --  0.79  CALCIUM 8.2* 8.5* 8.5* 8.5*  --  8.5*  MG  --  2.3  --  2.2  --  2.1  PHOS  --  3.1  --  1.8* 3.6 2.8   GFR: Estimated Creatinine Clearance: 74.8 mL/min (by C-G formula based on SCr of 0.79 mg/dL). Liver Function Tests: Recent Labs  Lab 10/21/21 0720 10/23/21 0911 10/24/21 0507  AST _0 ALT 49* 30 29  ALKPHOS 71 54 54  BILITOT 0.6 0.6 0.4  PROT 6.9 6.4* 6.1*  ALBUMIN 2.5* 2.8* 2.9*   No results for input(s): LIPASE, AMYLASE in the last 168 hours. No results for input(s): AMMONIA in the last 168 hours. Coagulation Profile: No results for input(s): INR, PROTIME in the last 168 hours. Cardiac Enzymes: No results for input(s): CKTOTAL, CKMB, CKMBINDEX, TROPONINI in the last 168 hours. BNP (last 3 results) No results for input(s): PROBNP in the last 8760 hours. HbA1C: No results for input(s): HGBA1C in the last 72 hours. CBG: No results for input(s): GLUCAP in the last 168 hours. Lipid Profile: No results for input(s): CHOL, HDL, LDLCALC, TRIG, CHOLHDL, LDLDIRECT in the last 72 hours. Thyroid Function Tests: No results for input(s): TSH, T4TOTAL, FREET4, T3FREE, THYROIDAB in the last 72 hours. Anemia Panel: Recent Labs    10/24/21 0507 10/25/21 0704  FERRITIN 210 220   Sepsis Labs: Recent Labs  Lab 10/20/21 0125 10/21/21 0709  PROCALCITON 0.60 0.31  LATICACIDVEN  --  1.3    Recent Results (from the past 240 hour(s))  Resp Panel by RT-PCR (Flu A&B, Covid) Nasopharyngeal Swab     Status: Abnormal   Collection Time: 10/19/21  8:24 PM   Specimen: Nasopharyngeal Swab; Nasopharyngeal(NP) swabs in vial transport medium  Result Value Ref Range Status   SARS Coronavirus 2 by RT PCR POSITIVE (A) NEGATIVE Final    Comment: (NOTE) SARS-CoV-2 target nucleic acids are DETECTED.  The SARS-CoV-2 RNA is generally detectable in upper respiratory specimens during the acute phase of infection.  Positive results are indicative of the presence of the identified virus, but do not rule out bacterial infection or co-infection with other pathogens not detected by the test. Clinical correlation with patient history and other diagnostic information is necessary to determine patient infection status. The expected result is Negative.  Fact Sheet for Patients: EntrepreneurPulse.com.au  Fact Sheet for Healthcare Providers: IncredibleEmployment.be  This test is not yet approved or cleared by the [place] FDA and  has been authorized for detection and/or diagnosis of SARS-CoV-2 by FDA under an Emergency Use Authorization (EUA).  This EUA will remain in effect (meaning this test can be used) for the duration of  the COVID-19 declaration under Section 564(b)(1) of the A ct, 21 U.S.C. section 360bbb-3(b)(1), unless the authorization is terminated or revoked sooner.     Influenza A by PCR NEGATIVE NEGATIVE Final   Influenza B by PCR NEGATIVE NEGATIVE Final    Comment: (NOTE) The Xpert Xpress SARS-CoV-2/FLU/RSV plus assay is intended as an aid in the diagnosis of influenza from Nasopharyngeal swab specimens and should not be used as a sole basis for treatment. Nasal washings and aspirates are unacceptable for Xpert Xpress SARS-CoV-2/FLU/RSV testing.  Fact Sheet for Patients: EntrepreneurPulse.com.au  Fact Sheet for Healthcare Providers: IncredibleEmployment.be  This test is not yet approved or cleared by the [place] FDA and has been authorized for detection and/or diagnosis of SARS-CoV-2 by FDA under an Emergency Use Authorization (EUA). This EUA will remain in effect (meaning this test can be used) for the duration of the COVID-19 declaration under Section 564(b)(1) of the Act, 21 U.S.C. section 360bbb-3(b)(1), unless the authorization is terminated or revoked.  Performed at [facility],  [address] [place], [place] [address]   Culture, blood (Routine X 2) w Reflex to ID Panel     Status: None (Preliminary result)   Collection Time:  10/21/21  7:21 AM   Specimen: Right Antecubital  Result Value Ref Range Status   Specimen Description RIGHT ANTECUBITAL  Final   Special Requests   Final    BOTTLES DRAWN AEROBIC AND ANAEROBIC Blood Culture adequate volume   Culture   Final    NO GROWTH 4 DAYS Performed at [facility], [address], [place], [place] [address]    Report Status PENDING  Incomplete  Culture, blood (Routine X 2) w Reflex to ID Panel     Status: None (Preliminary result)   Collection Time: 10/21/21  7:23 AM   Specimen: BLOOD RIGHT ARM  Result Value Ref Range Status   Specimen Description BLOOD RIGHT ARM  Final   Special Requests   Final    BOTTLES DRAWN AEROBIC ONLY Blood Culture adequate volume   Culture   Final    NO GROWTH 4 DAYS Performed at [facility], [address] [place],  [address]    Report Status PENDING  Incomplete         Radiology Studies: DG Chest Port 1 View  Result Date: 10/23/2021 CLINICAL DATA:  66 year old female with chronic lung disease. Recent increasing shortness of breath. EXAM: PORTABLE CHEST 1 VIEW COMPARISON:  Chest CTA 10/19/2021 and earlier. FINDINGS: Portable AP upright view at 0922 hours. Reduced lung volumes and upper lung predominant pulmonary fibrosis. Stable lung volumes and ventilation since 10/19/2021. Stable cardiac size and mediastinal contours. Multilevel augmented spinal compression fractures. Tracheal air column within normal limits. Paucity of bowel gas in the upper abdomen. IMPRESSION: Stable chronic lung disease. No superimposed acute findings are identified. Electronically Signed   By: Genevie Ann M.D.   On: 10/23/2021 09:40        Scheduled Meds:  acidophilus  1 capsule Oral Daily   albuterol  2 puff Inhalation QID   vitamin C  500 mg Oral Daily   azithromycin  500  mg Oral Daily   benzonatate  200 mg Oral TID   dorzolamide  1 drop Both Eyes BID   enoxaparin (LOVENOX) injection  40 mg Subcutaneous QHS   escitalopram  20 mg Oral Daily   folic acid  1 mg Oral Daily   furosemide  40 mg Intravenous Daily   latanoprost  1 drop Right Eye BID   levothyroxine  137 mcg Oral QAC breakfast   lidocaine  1 patch Transdermal Q24H   mometasone-formoterol  2 puff Inhalation BID   multivitamin with minerals  1 tablet Oral Daily   potassium chloride SA  20 mEq Oral BID   predniSONE  40 mg Oral Daily   Treprostinil  72 mcg Inhalation QID   zinc sulfate  220 mg Oral Daily   Continuous Infusions:     LOS: 6 days    Time spent: 25 mins.More than 50% of that time was spent in counseling and/or coordination of care.      Shelly Coss, MD [facility] P1/06/2022, 8:26 AM

## 2021-10-25 NOTE — Progress Notes (Signed)
NAME:  Stacie [name], MRN:  [id number], DOB:  1955/12/30, LOS: 6 ADMISSION DATE:  10/19/2021, CONSULTATION DATE:  10/20/2021 REFERRING MD:  Judd Gaudier MD  CHIEF COMPLAINT: SOB   HPI  66 y.o female with significant PMH of  chronic respiratory failure with hypoxia, Arrhythmia, Asthma, Chronic kidney disease, Depression, GERD, Heart murmur, History of kidney stones, hard of hearing, Hyperthyroidism, Hypothyroidism, IBS, Pneumonia, Pulmonary hypertension, Sarcoidosis, Sleep apnea (CPAP with O2), Stroke(07/2020) presented to the ED with acute onset of worsening dyspnea associated with productive yellow sputum cough, wheezing, chills without fever mild abdominal pain.  Past Medical History  Acute on chronic respiratory failure with hypoxia ([place]), Arrhythmia, Asthma, Chronic kidney disease, Depression, GERD (gastroesophageal reflux disease), Heart murmur, History of kidney stones, HOH (hard of hearing), Hyperthyroidism, Hypothyroidism, IBS (irritable bowel syndrome), Pneumonia, Pulmonary hypertension ([place]), Sarcoid, Sarcoidosis, Seasonal allergies, Sleep apnea (CPAP with O2), Stroke ([place]) (07/2020), and Wears hearing aid in both ears.   10/24/21- patient with acute post COVID19 strep pneumoniae CAP and concomitant acute exacerbation of ILD with sarcoidosis.     1/9 alert and awake, NAD, seems to be back to baseline resp status  Significant Hospital Events   1/3: Admitted for acute hypoxic respiratory failure secondary to COVID-19 with Acute Pneumonia  1/4: PCCM consulted 1/5-1/9 oxygen support and IV abx and steroids  Consults:  PCCM Podiatry  Procedures:  None  Significant Diagnostic Tests:  1/3: Chest Xray>showed stable extensive chronic findings consistent with sarcoidosis and interval development of mild mild right lung scarring and/or atelectasis. 1/3: CTA Chest>1. No evidence of pulmonary embolism. 2. Stable findings consistent with end-stage sarcoid. 3. 4 mm and 8 mm ill-defined  subsolid left lower lobe lung nodules. Non-contrast chest CT at 3-6 months is recommended. If nodules persist and are stable at that time, consider additional non-contrast chest CT examinations at 2 and 4 years. This recommendation follows the consensus statement: Guidelines for Management of Incidental Pulmonary Nodules Detected on CT Images: From the Fleischner Society 2017; Radiology 2017; 284:228-243. 4. Small hiatal hernia. 5. Evidence of prior vertebroplasty throughout multiple lower thoracic and upper lumbar spine vertebra. 6. Aortic atherosclerosis  Micro Data:  1/3: SARS-CoV-2 PCR> POSITIVE 1/3: Influenza PCR> negative 1/5: Blood culture x2> 1/5: Urine Culture> 1/5: MRSA PCR>>  1/5: Strep pneumo urinary antigen> 1/5: Legionella urinary antigen> 1/5: Mycoplasma pneumonia>  Antimicrobials:  Azithromycin 1/3> Ceftriaxone 1/3>   INTERVAL CHANGES No apparent distress Still weak SOB back to baseline      OBJECTIVE  Blood pressure 139/81, pulse (!) 102, temperature 98 F (36.7 C), temperature source Oral, resp. rate 19, height _0  (1.702 m), weight 77.1 kg, SpO2 98 %.        Intake/Output Summary (Last 24 hours) at 10/25/2021 1556 Last data filed at 10/25/2021 1228 Gross per 24 hour  Intake 280.7 ml  Output 1450 ml  Net -1169.3 ml    Filed Weights   10/25/21 1534  Weight: 77.1 kg    Review of Systems:  Gen:  Denies  fever, sweats, chills weight loss  HEENT: Denies blurred vision, double vision, ear pain, eye pain, hearing loss, nose bleeds, sore throat Cardiac:  No dizziness, chest pain or heaviness, chest tightness,edema, No JVD Resp:   No cough, -sputum production, +shortness of breath,-wheezing, -hemoptysis,  Other:  All other systems negative   Physical Examination  GENERAL:  lying in the bed with no acute distress.  LUNGS: Normal breath sounds bilaterally, no wheezing, rales,rhonchi or crepitation. No use of  accessory muscles of respiration.  +crackles CARDIOVASCULAR: S1, S2 normal. No murmurs, rubs, or gallops.  NEUROLOGIC: inatct PSYCHIATRIC: The patient is alert and oriented x 3.    Labs/imaging that I havepersonally reviewed  (right click and "Reselect all SmartList Selections" daily)     Labs   CBC: Recent Labs  Lab 10/19/21 2024 10/20/21 0806 10/21/21 1621 10/23/21 0742 10/24/21 0507  WBC 5.0 2.3* 2.2* 2.1* 3.2*  NEUTROABS  --   --  2.1  --   --   HGB 11.6* 10.2* 10.4* 10.9* 11.0*  HCT 37.2 33.0* 33.1* 35.2* 34.8*  MCV 96.9 95.9 94.3 93.6 93.3  PLT 385 356 452* 499* 465*     Basic Metabolic Panel: Recent Labs  Lab 10/20/21 0806 10/21/21 0720 10/22/21 1023 10/23/21 0911 10/23/21 1952 10/24/21 0507  NA 138 144 141 139  --  138  K 4.3 4.6 4.2 4.4  --  4.0  CL 98 98 95* 98  --  97*  CO2 29 36* 36* 34*  --  33*  GLUCOSE 199* 151* 195* 139*  --  111*  BUN 31* 39* 44* 40*  --  37*  CREATININE 1.05* 0.99 0.99 0.87  --  0.79  CALCIUM 8.2* 8.5* 8.5* 8.5*  --  8.5*  MG  --  2.3  --  2.2  --  2.1  PHOS  --  3.1  --  1.8* 3.6 2.8    GFR: Estimated Creatinine Clearance: 74 mL/min (by C-G formula based on SCr of 0.79 mg/dL). Recent Labs  Lab 10/20/21 0125 10/20/21 0806 10/21/21 0709 10/21/21 1621 10/23/21 0742 10/24/21 0507  PROCALCITON 0.60  --  0.31  --   --   --   WBC  --  2.3*  --  2.2* 2.1* 3.2*  LATICACIDVEN  --   --  1.3  --   --   --      Liver Function Tests: Recent Labs  Lab 10/21/21 0720 10/23/21 0911 10/24/21 0507  AST _0 ALT 49* 30 29  ALKPHOS 71 54 54  BILITOT 0.6 0.6 0.4  PROT 6.9 6.4* 6.1*  ALBUMIN 2.5* 2.8* 2.9*    No results for input(s): LIPASE, AMYLASE in the last 168 hours. No results for input(s): AMMONIA in the last 168 hours.  ABG    Component Value Date/Time   PHART 7.41 07/16/2020 0841   PCO2ART 52 (H) 07/16/2020 0841   PO2ART 76 (L) 07/16/2020 0841   HCO3 31.7 (H) 10/19/2021 2032   TCO2 30 01/14/2021 1138   ACIDBASEDEF 0.5 07/12/2020 1205    O2SAT 33.6 10/19/2021 2032      Coagulation Profile: No results for input(s): INR, PROTIME in the last 168 hours.  Cardiac Enzymes: No results for input(s): CKTOTAL, CKMB, CKMBINDEX, TROPONINI in the last 168 hours.  HbA1C: Hgb A1c MFr Bld  Date/Time Value Ref Range Status  07/16/2020 07:06 AM 6.4 (H) 4.8 - 5.6 % Final    Comment:    (NOTE) Pre diabetes:          5.7%-6.4%  Diabetes:              >6.4%  Glycemic control for   <7.0% adults with diabetes   04/20/2017 09:09 AM 6.0 (H) 4.8 - 5.6 % Final    Comment:             Pre-diabetes: 5.7 - 6.4          Diabetes: >6.4  Glycemic control for adults with diabetes: <7.0     Home Medications  Prior to Admission medications   Medication Sig Start Date End Date Taking? Authorizing Provider  acetaminophen (TYLENOL) 500 MG tablet Take 500 mg by mouth every 6 (six) hours as needed for moderate pain or headache.   Yes [provider]  albuterol (PROVENTIL) (2.5 MG/3ML) 0.083% nebulizer solution Inhale 2.5 mg into the lungs every 6 (six) hours as needed for wheezing or shortness of breath. 01/04/21 01/04/22 Yes [provider]  budesonide-formoterol (SYMBICORT) 80-4.5 MCG/ACT inhaler Take 2 puffs first thing in am and then another 2 puffs about 12 hours later. Patient taking differently: Inhale 2 puffs into the lungs in the morning and at bedtime. 08/13/20  Yes Angiulli, Lavon Paganini, PA-C  cefdinir (OMNICEF) 300 MG capsule Take 1 capsule (300 mg total) by mouth 2 (two) times daily for 10 days. 10/13/21 10/23/21 Yes Glean Hess, MD  dorzolamide (TRUSOPT) 2 % ophthalmic solution Place 1 drop into both eyes 2 (two) times daily.   Yes [provider]  escitalopram (LEXAPRO) 20 MG tablet TAKE 1 TABLET BY MOUTH EVERY DAY 08/18/21  Yes Glean Hess, MD  folic acid (FOLVITE) 1 MG tablet Take 1 tablet (1 mg total) by mouth daily. 08/13/20  Yes Angiulli, Lavon Paganini, PA-C  HYDROcodone bit-homatropine (HYCODAN)  5-1.5 MG/5ML syrup Take 5 mLs by mouth every 6 (six) hours as needed for up to 10 days for cough. 10/13/21 10/23/21 Yes Glean Hess, MD  latanoprost (XALATAN) 0.005 % ophthalmic solution Place 1 drop into the right eye 2 (two) times daily.   Yes [provider]  levothyroxine (SYNTHROID) 137 MCG tablet TAKE 1 TABLET (137 MCG TOTAL) BY MOUTH DAILY BEFORE BREAKFAST. Patient taking differently: Take 137 mcg by mouth daily before breakfast. 12/08/20  Yes Glean Hess, MD  Menthol (ICY HOT) 5 % [facility] Apply 1 patch topically daily as needed (pain).   Yes [provider]  methotrexate (RHEUMATREX) 2.5 MG tablet Take 10 tablets (25 mg total) by mouth every Tuesday. 02/23/21  Yes Elgergawy, Silver Huguenin, MD  Multiple Vitamins-Minerals (MULTIVITAMIN WITH MINERALS) tablet Take 1 tablet by mouth daily.   Yes [provider]  naproxen sodium (ALEVE) 220 MG tablet Take 220 mg by mouth daily as needed (pain).   Yes [provider]  potassium citrate (UROCIT-K) 10 MEQ (1080 MG) SR tablet Take 20 mEq by mouth 2 (two) times daily. 05/31/21  Yes [provider]  predniSONE (DELTASONE) 10 MG tablet Take 19 mg by mouth daily.   Yes [provider]  Probiotic Product (PROBIOTIC PO) Take 1 capsule by mouth daily.   Yes [provider]  Treprostinil (TYVASO) 0.6 MG/ML SOLN Inhale into the lungs See admin instructions. 12 breaths 4 times a day   Yes [provider]  cyclobenzaprine (FLEXERIL) 5 MG tablet Take 5-10 mg by mouth 3 (three) times daily as needed. 07/09/21   [provider]  denosumab (PROLIA) 60 MG/ML SOSY injection Inject 60 mg into the skin every 6 (six) months.    [provider]  HYDROcodone-acetaminophen (NORCO) 5-325 MG tablet Take 1 tablet by mouth every 6 (six) hours as needed for moderate pain. Patient not taking: Reported on 10/19/2021 06/25/21   Hessie Knows, MD  NON FORMULARY CPAP nightly    [provider]   OXYGEN Inhale 2 L into the lungs daily as needed (During walking and activity).     [provider]  Scheduled Meds:  acidophilus  1 capsule Oral Daily   albuterol  2 puff Inhalation QID   vitamin C  500 mg Oral Daily   benzonatate  200 mg Oral TID   dorzolamide  1 drop Both Eyes BID   enoxaparin (LOVENOX) injection  40 mg Subcutaneous QHS   escitalopram  20 mg Oral Daily   folic acid  1 mg Oral Daily   furosemide  40 mg Oral Daily   latanoprost  1 drop Right Eye BID   levothyroxine  137 mcg Oral QAC breakfast   lidocaine  1 patch Transdermal Q24H   mometasone-formoterol  2 puff Inhalation BID   multivitamin with minerals  1 tablet Oral Daily   potassium chloride SA  20 mEq Oral BID   predniSONE  40 mg Oral Daily   Treprostinil  72 mcg Inhalation QID   zinc sulfate  220 mg Oral Daily      Assessment & Plan:  Acute on Chronic Hypoxic Respiratory Failure secondary to CAP strep pneumoniae post COVID-19 Pneumonia  AND acute exacerbation of ILD with pulmonary sarcoidosis-seems to be back to baseline PMHx: ILD, interstitial lung disease on chronic home O2, sarcoidosis, asthma -Supplemental O2 as needed to maintain O2 saturations 88 to 92% -COVID protocol - reduced steroids to 40 pred daily  -IS/PT/OT    Pulmonary HTN - ILD associated PH due to Sarcoidosis    - continue home Tyvaso QID    - c/w steroids , now patient is on PO pred only  -repeat CXR - 1/7 /22  Pulmonary comorbid conditions   OSA/sarcoidosis    - continue supportive care with CPAP   - steroids    - PT/OT  Sepsis due to osteomyelitis of left toe -RULED OUT     AKI on CKD Stage III- IMPROVED  -Monitor I&O's / urinary output -Follow BMP -Ensure adequate renal perfusion -Avoid nephrotoxic agents as able -Replace electrolytes as indicated   Hypothyroidism -Continue Synthroid   Potential discharge home soon... PCCM to sign off at this time  Corrin Parker, M.D.  [facility]  Medical Director [place] Director [facility] Cardio-Pulmonary Department

## 2021-10-25 NOTE — Progress Notes (Signed)
Patient was escorted to bathroom and started feeling week.  She was lowered to the floor by nursing assistant.  This RN was called and helped her get back to the bed. Vitals are WDL.  Currently on 4LNC.  MD aware.  Charge nurse aware.  AD aware.  Patient said she would notify her family of the event.

## 2021-10-25 NOTE — Progress Notes (Signed)
PROGRESS NOTE    Stacie [name]  VHQ:[id number] DOB: 1956/01/25 DOA: 10/19/2021 PCP: Glean Hess, MD   Chief Complain: Shortness of breath  Brief Narrative: Patient is a 66 year old female with history of GERD, hypothyroidism, sarcoidosis, sleep apnea , CVA, asthma, chronic hypoxia on 2 L of oxygen at home, the patient who presented to the ER with complaints of worsening dyspnea, cough, wheezing.  On presentation, COVID screening test came out to be positive.  She required BiPAP on presentation.  Started on remdesivir, IV Solu-Medrol, also IV antibiotics.  Her respiratory status is gradually improved.  She is down to 4 L/min today..  Goal is to discharge to home with home with home health after improvement in the oxygen requirement/when it comes close to her baseline  Assessment & Plan:   Principal Problem:   ARF (acute respiratory failure) ([place]) Active Problems:   Acute respiratory failure ([place])   Acute on chronic hypoxic respiratory failure: Secondary to COVID infection, could also be contributed by asthma exacerbation.Also has H/O of sarcoidosis.  She is on oxygen at 2 L at home.   On presentation she had to be put on BiPAP. Chest x-ray on admission showed bilateral pulmonary infiltrates greater on the left :started on remdesivir, IV antibiotics empirically, IV steroids.  Steroid switched to prednisone. She completed remdesivir. On vitamin C, folic acid.  Currently on 4 L of oxygen per minute.  We will continue to wean the oxygen.  She completed course of ceftriaxone.  Continue mucolytics, spirometer, flutter valve.  COVID-pneumonia: On presentation she was hypoxic, chest ray showed bilateral infiltrates more on the left.  Continue current management.  She finished the course of remdesivir. Strep pneumoniae urine antigen was positive.  She was on  ceftriaxone, azithromycin  Left forefoot ulceration: Status post first and second partial ray resection.  Ulcer present on admission.   Podiatry was following here.  Continue wound care on discharge.  History of sleep apnea: Uses CPAP at home  Rheumatoid arthritis: On methotrexate as an outpatient.  Hypothyroidism: On Synthyroid  Depression: on Lexapro  Deconditioning/debility: PT/OT recommend home health on discharge.            DVT prophylaxis:Lovenox Code Status: Full Family Communication: called and discussed with husband on phone today Patient status:Inpatient  Dispo: The patient is from: Home              Anticipated d/c is to: Home              Anticipated d/c date is: Tomorrow, attempting to lower the oxygen back to 2 L/min which is her baseline.  Consultants: PCCM  Procedures:None  Antimicrobials:  Anti-infectives (From admission, onward)    Start     Dose/Rate Route Frequency Ordered Stop   10/24/21 1000  azithromycin (ZITHROMAX) tablet 500 mg        500 mg Oral Daily 10/23/21 1156 10/25/21 1126   10/22/21 1000  remdesivir 100 mg in sodium chloride 0.9 % 100 mL IVPB  Status:  Discontinued       See Hyperspace for full Linked Orders Report.   100 mg 200 mL/hr over 30 Minutes Intravenous Daily 10/21/21 1226 10/24/21 1114   10/21/21 1230  remdesivir 200 mg in sodium chloride 0.9% 250 mL IVPB       See Hyperspace for full Linked Orders Report.   200 mg 580 mL/hr over 30 Minutes Intravenous Once 10/21/21 1226 10/21/21 1314   10/21/21 1000  remdesivir 100 mg in sodium chloride  0.9 % 100 mL IVPB  Status:  Discontinued       See Hyperspace for full Linked Orders Report.   100 mg 200 mL/hr over 30 Minutes Intravenous Daily 10/20/21 0336 10/21/21 1226   10/21/21 0800  azithromycin (ZITHROMAX) 500 mg in sodium chloride 0.9 % 250 mL IVPB  Status:  Discontinued        500 mg 250 mL/hr over 60 Minutes Intravenous Every 24 hours 10/21/21 0626 10/23/21 1156   10/20/21 0500  remdesivir 200 mg in sodium chloride 0.9% 250 mL IVPB  Status:  Discontinued       See Hyperspace for full Linked Orders Report.    200 mg 580 mL/hr over 30 Minutes Intravenous Once 10/20/21 0336 10/21/21 1226   10/20/21 0000  cefTRIAXone (ROCEPHIN) 1 g in sodium chloride 0.9 % 100 mL IVPB        1 g 200 mL/hr over 30 Minutes Intravenous Daily at bedtime 10/19/21 2328 10/23/21 2138       Subjective:  Patient seen and examined at the bedside this morning.  Hemodynamically stable.  On 4 L of oxygen per minute.  She feels better today.  Denies any worsening shortness of breath or cough but very weak, she gets easily exhausted while going to the bathroom.  She is excited about perspective of  going home  Objective: Vitals:   10/24/21 2208 10/25/21 0344 10/25/21 0841 10/25/21 1227  BP: 124/80 125/83 128/77 139/81  Pulse: 95 90 100 (!) 102  Resp:   20 19  Temp: 98.7 F (37.1 C) 97.6 F (36.4 C) 97.8 F (36.6 C) 98 F (36.7 C)  TempSrc:   Oral Oral  SpO2: 99% 99% 98% 98%    Intake/Output Summary (Last 24 hours) at 10/25/2021 1324 Last data filed at 10/25/2021 1228 Gross per 24 hour  Intake 280.7 ml  Output 1450 ml  Net -1169.3 ml   There were no vitals filed for this visit.  Examination:  General exam: Very weak, chronically ill looking HEENT: PERRL Respiratory system:  no wheezes or crackles  Cardiovascular system: S1 & S2 heard, RRR.  Gastrointestinal system: Abdomen is nondistended, soft and nontender. Central nervous system: Alert and oriented Extremities: No edema, no clubbing ,no cyanosis Skin: No rashes, no ulcers,no icterus     Data Reviewed: I have personally reviewed following labs and imaging studies  CBC: Recent Labs  Lab 10/19/21 2024 10/20/21 0806 10/21/21 1621 10/23/21 0742 10/24/21 0507  WBC 5.0 2.3* 2.2* 2.1* 3.2*  NEUTROABS  --   --  2.1  --   --   HGB 11.6* 10.2* 10.4* 10.9* 11.0*  HCT 37.2 33.0* 33.1* 35.2* 34.8*  MCV 96.9 95.9 94.3 93.6 93.3  PLT 385 356 452* 499* 462*   Basic Metabolic Panel: Recent Labs  Lab 10/20/21 0806 10/21/21 0720 10/22/21 1023  10/23/21 0911 10/23/21 1952 10/24/21 0507  NA 138 144 141 139  --  138  K 4.3 4.6 4.2 4.4  --  4.0  CL 98 98 95* 98  --  97*  CO2 29 36* 36* 34*  --  33*  GLUCOSE 199* 151* 195* 139*  --  111*  BUN 31* 39* 44* 40*  --  37*  CREATININE 1.05* 0.99 0.99 0.87  --  0.79  CALCIUM 8.2* 8.5* 8.5* 8.5*  --  8.5*  MG  --  2.3  --  2.2  --  2.1  PHOS  --  3.1  --  1.8* 3.6 2.8  GFR: Estimated Creatinine Clearance: 74.8 mL/min (by C-G formula based on SCr of 0.79 mg/dL). Liver Function Tests: Recent Labs  Lab 10/21/21 0720 10/23/21 0911 10/24/21 0507  AST _0 ALT 49* 30 29  ALKPHOS 71 54 54  BILITOT 0.6 0.6 0.4  PROT 6.9 6.4* 6.1*  ALBUMIN 2.5* 2.8* 2.9*   No results for input(s): LIPASE, AMYLASE in the last 168 hours. No results for input(s): AMMONIA in the last 168 hours. Coagulation Profile: No results for input(s): INR, PROTIME in the last 168 hours. Cardiac Enzymes: No results for input(s): CKTOTAL, CKMB, CKMBINDEX, TROPONINI in the last 168 hours. BNP (last 3 results) No results for input(s): PROBNP in the last 8760 hours. HbA1C: No results for input(s): HGBA1C in the last 72 hours. CBG: No results for input(s): GLUCAP in the last 168 hours. Lipid Profile: No results for input(s): CHOL, HDL, LDLCALC, TRIG, CHOLHDL, LDLDIRECT in the last 72 hours. Thyroid Function Tests: No results for input(s): TSH, T4TOTAL, FREET4, T3FREE, THYROIDAB in the last 72 hours. Anemia Panel: Recent Labs    10/24/21 0507 10/25/21 0704  FERRITIN 210 220   Sepsis Labs: Recent Labs  Lab 10/20/21 0125 10/21/21 0709  PROCALCITON 0.60 0.31  LATICACIDVEN  --  1.3    Recent Results (from the past 240 hour(s))  Resp Panel by RT-PCR (Flu A&B, Covid) Nasopharyngeal Swab     Status: Abnormal   Collection Time: 10/19/21  8:24 PM   Specimen: Nasopharyngeal Swab; Nasopharyngeal(NP) swabs in vial transport medium  Result Value Ref Range Status   SARS Coronavirus 2 by RT PCR POSITIVE (A)  NEGATIVE Final    Comment: (NOTE) SARS-CoV-2 target nucleic acids are DETECTED.  The SARS-CoV-2 RNA is generally detectable in upper respiratory specimens during the acute phase of infection. Positive results are indicative of the presence of the identified virus, but do not rule out bacterial infection or co-infection with other pathogens not detected by the test. Clinical correlation with patient history and other diagnostic information is necessary to determine patient infection status. The expected result is Negative.  Fact Sheet for Patients: EntrepreneurPulse.com.au  Fact Sheet for Healthcare Providers: IncredibleEmployment.be  This test is not yet approved or cleared by the [place] FDA and  has been authorized for detection and/or diagnosis of SARS-CoV-2 by FDA under an Emergency Use Authorization (EUA).  This EUA will remain in effect (meaning this test can be used) for the duration of  the COVID-19 declaration under Section 564(b)(1) of the A ct, 21 U.S.C. section 360bbb-3(b)(1), unless the authorization is terminated or revoked sooner.     Influenza A by PCR NEGATIVE NEGATIVE Final   Influenza B by PCR NEGATIVE NEGATIVE Final    Comment: (NOTE) The Xpert Xpress SARS-CoV-2/FLU/RSV plus assay is intended as an aid in the diagnosis of influenza from Nasopharyngeal swab specimens and should not be used as a sole basis for treatment. Nasal washings and aspirates are unacceptable for Xpert Xpress SARS-CoV-2/FLU/RSV testing.  Fact Sheet for Patients: EntrepreneurPulse.com.au  Fact Sheet for Healthcare Providers: IncredibleEmployment.be  This test is not yet approved or cleared by the [place] FDA and has been authorized for detection and/or diagnosis of SARS-CoV-2 by FDA under an Emergency Use Authorization (EUA). This EUA will remain in effect (meaning this test can be used) for the  duration of the COVID-19 declaration under Section 564(b)(1) of the Act, 21 U.S.C. section 360bbb-3(b)(1), unless the authorization is terminated or revoked.  Performed at [facility], [address] [place], [place] [address]   Culture, blood (Routine X 2) w Reflex to ID Panel     Status: None (Preliminary result)   Collection Time: 10/21/21  7:21 AM   Specimen: Right Antecubital  Result Value Ref Range Status   Specimen Description RIGHT ANTECUBITAL  Final   Special Requests   Final    BOTTLES DRAWN AEROBIC AND ANAEROBIC Blood Culture adequate volume   Culture   Final    NO GROWTH 4 DAYS Performed at [facility], [address], [place], [place] [address]    Report Status PENDING  Incomplete  Culture, blood (Routine X 2) w Reflex to ID Panel     Status: None (Preliminary result)   Collection Time: 10/21/21  7:23 AM   Specimen: BLOOD RIGHT ARM  Result Value Ref Range Status   Specimen Description BLOOD RIGHT ARM  Final   Special Requests   Final    BOTTLES DRAWN AEROBIC ONLY Blood Culture adequate volume   Culture   Final    NO GROWTH 4 DAYS Performed at [facility], [address] [place], [place] [address]    Report Status PENDING  Incomplete         Radiology Studies: DG Chest Port 1 View  Result Date: 10/25/2021 CLINICAL DATA:  Atelectasis, COVID-19 positive 6 days prior EXAM: PORTABLE CHEST 1 VIEW COMPARISON:  10/23/2021 chest radiograph. FINDINGS: Vertebroplasty material noted over several mid to lower thoracic vertebrae. Stable cardiomediastinal silhouette with normal heart size. No pneumothorax. Chronic blunting of the left costophrenic angle. No right pleural effusion. Patchy reticular opacities throughout both lungs, most prominent in the apical left lung and basilar right lung with associated volume loss and distortion, unchanged. No superimposed acute consolidative airspace disease. IMPRESSION: Chronic fibrotic interstitial lung  disease. No superimposed acute cardiopulmonary disease. Electronically Signed   By: Ilona Sorrel M.D.   On: 10/25/2021 10:09        Scheduled Meds:  acidophilus  1 capsule Oral Daily   albuterol  2 puff Inhalation QID   vitamin C  500 mg Oral Daily   benzonatate  200 mg Oral TID   dorzolamide  1 drop Both Eyes BID   enoxaparin (LOVENOX) injection  40 mg Subcutaneous QHS   escitalopram  20 mg Oral Daily   folic acid  1 mg Oral Daily   furosemide  40 mg Intravenous Daily   latanoprost  1 drop Right Eye BID   levothyroxine  137 mcg Oral QAC breakfast   lidocaine  1 patch Transdermal Q24H   mometasone-formoterol  2 puff Inhalation BID   multivitamin with minerals  1 tablet Oral Daily   potassium chloride SA  20 mEq Oral BID   predniSONE  40 mg Oral Daily   Treprostinil  72 mcg Inhalation QID   zinc sulfate  220 mg Oral Daily   Continuous Infusions:     LOS: 6 days    Time spent: 25 mins.More than 50% of that time was spent in counseling and/or coordination of care.      Shelly Coss, MD [facility] P1/06/2022, 1:24 PM

## 2021-10-26 DIAGNOSIS — J9601 Acute respiratory failure with hypoxia: Secondary | ICD-10-CM | POA: Diagnosis not present

## 2021-10-26 LAB — CULTURE, BLOOD (ROUTINE X 2)
Culture: NO GROWTH
Culture: NO GROWTH
Special Requests: ADEQUATE
Special Requests: ADEQUATE

## 2021-10-26 LAB — CBC WITH DIFFERENTIAL/PLATELET
Abs Immature Granulocytes: 0.04 10*3/uL (ref 0.00–0.07)
Basophils Absolute: 0.1 10*3/uL (ref 0.0–0.1)
Basophils Relative: 1 %
Eosinophils Absolute: 0.1 10*3/uL (ref 0.0–0.5)
Eosinophils Relative: 2 %
HCT: 38.4 % (ref 36.0–46.0)
Hemoglobin: 12.1 g/dL (ref 12.0–15.0)
Immature Granulocytes: 1 %
Lymphocytes Relative: 15 %
Lymphs Abs: 0.7 10*3/uL (ref 0.7–4.0)
MCH: 29.4 pg (ref 26.0–34.0)
MCHC: 31.5 g/dL (ref 30.0–36.0)
MCV: 93.4 fL (ref 80.0–100.0)
Monocytes Absolute: 0.2 10*3/uL (ref 0.1–1.0)
Monocytes Relative: 3 %
Neutro Abs: 3.6 10*3/uL (ref 1.7–7.7)
Neutrophils Relative %: 78 %
Platelets: 480 10*3/uL — ABNORMAL HIGH (ref 150–400)
RBC: 4.11 MIL/uL (ref 3.87–5.11)
RDW: 13.8 % (ref 11.5–15.5)
WBC: 4.6 10*3/uL (ref 4.0–10.5)
nRBC: 0 % (ref 0.0–0.2)

## 2021-10-26 LAB — BASIC METABOLIC PANEL
Anion gap: 10 (ref 5–15)
BUN: 44 mg/dL — ABNORMAL HIGH (ref 8–23)
CO2: 36 mmol/L — ABNORMAL HIGH (ref 22–32)
Calcium: 9.3 mg/dL (ref 8.9–10.3)
Chloride: 92 mmol/L — ABNORMAL LOW (ref 98–111)
Creatinine, Ser: 1.27 mg/dL — ABNORMAL HIGH (ref 0.44–1.00)
GFR, Estimated: 47 mL/min — ABNORMAL LOW (ref >60)
Glucose, Bld: 114 mg/dL — ABNORMAL HIGH (ref 70–99)
Potassium: 4.5 mmol/L (ref 3.5–5.1)
Sodium: 138 mmol/L (ref 135–145)

## 2021-10-26 LAB — LEGIONELLA PNEUMOPHILA SEROGP 1 UR AG: L. pneumophila Serogp 1 Ur Ag: NEGATIVE

## 2021-10-26 MED ORDER — PREDNISONE 10 MG PO TABS: 19.0000 mg | ORAL_TABLET | Freq: Every day | ORAL | Status: AC

## 2021-10-26 MED ORDER — PREDNISONE 20 MG PO TABS: 40.0000 mg | ORAL_TABLET | Freq: Every day | ORAL | 0 refills | Status: AC

## 2021-10-26 MED ORDER — ZINC SULFATE 220 (50 ZN) MG PO CAPS: 220.0000 mg | ORAL_CAPSULE | Freq: Every day | ORAL | 0 refills | Status: AC

## 2021-10-26 MED ORDER — BENZONATATE 100 MG PO CAPS: 200.0000 mg | ORAL_CAPSULE | Freq: Three times a day (TID) | ORAL | 0 refills | Status: AC | PRN

## 2021-10-26 MED ORDER — ASCORBIC ACID 500 MG PO TABS: 500.0000 mg | ORAL_TABLET | Freq: Every day | ORAL | 0 refills | Status: AC

## 2021-10-26 NOTE — TOC Progression Note (Addendum)
Transition of Care [facility]) - Progression Note    Patient Details  Name: SHANTEA POULTON MRN: [id number] Date of Birth: [birth year]  Transition of [facility]) CM/SW [place], [place] Phone Number: 10/26/2021, 10:01 AM  Clinical Narrative:     CSW spoke with patient's spouse Eddie Dibbles who is in agreement with home health recommendations with no preference of agency.   CSW spoke with [place] at [facility] who is able to accept patient for [facility] PT and RN.   Will need HH orders for PT and RN at time of discharge.   CSW also spoke with patient's daughter who will transport home, she reports she brought patient's home O2 to transport patient home today as patient is back to baseline 2L O2. Is agreeable with [facility] PT and RN. No further needs identified.    Expected Discharge Plan: Home/Self Care Barriers to Discharge: No Barriers Identified  Expected Discharge Plan and Services Expected Discharge Plan: Home/Self Care       Living arrangements for the past 2 months: Single Family Home                                       Social Determinants of Health (SDOH) Interventions    Readmission Risk Interventions Readmission Risk Prevention Plan 05/07/2021 07/17/2020  Transportation Screening Complete Complete  PCP or Specialist Appt within 3-5 Days Complete Complete  HRI or [place] Complete Complete  Social Work Consult for [place] Planning/Counseling Complete -  Palliative Care Screening Complete Complete  Medication Review Press photographer) Complete Complete  Some recent data might be hidden

## 2021-10-26 NOTE — Discharge Summary (Addendum)
Physician Discharge Summary  Stacie [name] BMZ:[id number] DOB: [birth year] DOA: 10/19/2021  PCP: Glean Hess, MD  Admit date: 10/19/2021 Discharge date: 10/26/2021  Admitted From: Home Disposition:  Home  Discharge Condition:Stable CODE STATUS:FULL Diet recommendation: Heart Healthy   Brief/Interim Summary:  Patient is a 66 year old female with history of GERD, hypothyroidism, sarcoidosis, sleep apnea , CVA, asthma, chronic hypoxia on 2 L of oxygen at home, the patient who presented to the ER with complaints of worsening dyspnea, cough, wheezing.  On presentation, COVID screening test came out to be positive.  She required BiPAP on presentation.  Started on remdesivir, IV Solu-Medrol, also IV antibiotics.  Her respiratory status is gradually improved.  She is down to 2 L/min today, her baseline.  She is medically stable for discharge to home today with home health  Following problems were addressed during her hospitalization:  Acute on chronic hypoxic respiratory failure: Secondary to COVID infection, could also be contributed by asthma exacerbation.Also has H/O of sarcoidosis.  She is on oxygen at 2 L at home.   On presentation she had to be put on BiPAP. Chest x-ray on admission showed bilateral pulmonary infiltrates greater on the left :started on remdesivir, IV antibiotics empirically, IV steroids.  Steroid switched to prednisone. She completed remdesivir. On vitamin C, folic acid.  Currently on 2 L of oxygen per minute. She completed course of ceftriaxone.   COVID-pneumonia: On presentation she was hypoxic, chest ray showed bilateral infiltrates more on the left.  She finished the course of remdesivir. Strep pneumoniae urine antigen was positive.  She was on  ceftriaxone, azithromycin,finished the course.   Left forefoot ulceration: Status post first and second partial ray resection.  Ulcer present on admission.  Podiatry was following here.     History of sleep apnea: Uses CPAP  at home   Rheumatoid arthritis: On methotrexate as an outpatient.   Hypothyroidism: On Synthyroid   Depression: on Lexapro   Deconditioning/debility: PT/OT recommend home health on discharge.      Discharge Diagnoses:  Principal Problem:   ARF (acute respiratory failure) ([place]) Active Problems:   Acute respiratory failure [facility])    Discharge Instructions  Discharge Instructions     Diet - low sodium heart healthy   Complete by: As directed    Discharge instructions   Complete by: As directed    1)Please take prescribed medication as instructed 2)Follow up with your PCP and pulmonologist as an outpatient   Increase activity slowly   Complete by: As directed    No wound care   Complete by: As directed       Allergies as of 10/26/2021       Reactions   Nitrofurantoin Nausea Only   Plaquenil [hydroxychloroquine]    Unknown reaction    Tramadol Nausea And Vomiting   Sulfa Antibiotics Rash   As an infant        Medication List     STOP taking these medications    cefdinir 300 MG capsule Commonly known as: OMNICEF   HYDROcodone bit-homatropine 5-1.5 MG/5ML syrup Commonly known as: HYCODAN   HYDROcodone-acetaminophen 5-325 MG tablet Commonly known as: Norco       TAKE these medications    acetaminophen 500 MG tablet Commonly known as: TYLENOL Take 500 mg by mouth every 6 (six) hours as needed for moderate pain or headache.   albuterol (2.5 MG/3ML) 0.083% nebulizer solution Commonly known as: PROVENTIL Inhale 2.5 mg into the lungs every 6 (six) hours as  needed for wheezing or shortness of breath.   ascorbic acid 500 MG tablet Commonly known as: VITAMIN C Take 1 tablet (500 mg total) by mouth daily for 7 days. Start taking on: October 27, 2021   benzonatate 100 MG capsule Commonly known as: Best boy Take 2 capsules (200 mg total) by mouth 3 (three) times daily as needed for up to 7 days for cough.   budesonide-formoterol 80-4.5 MCG/ACT  inhaler Commonly known as: Symbicort Take 2 puffs first thing in am and then another 2 puffs about 12 hours later. What changed:  how much to take how to take this when to take this additional instructions   cyclobenzaprine 5 MG tablet Commonly known as: FLEXERIL Take 5-10 mg by mouth 3 (three) times daily as needed.   denosumab 60 MG/ML Sosy injection Commonly known as: PROLIA Inject 60 mg into the skin every 6 (six) months.   dorzolamide 2 % ophthalmic solution Commonly known as: TRUSOPT Place 1 drop into both eyes 2 (two) times daily.   escitalopram 20 MG tablet Commonly known as: LEXAPRO TAKE 1 TABLET BY MOUTH EVERY DAY   folic acid 1 MG tablet Commonly known as: FOLVITE Take 1 tablet (1 mg total) by mouth daily.   Icy Hot 5 % Ptch Generic drug: Menthol Apply 1 patch topically daily as needed (pain).   latanoprost 0.005 % ophthalmic solution Commonly known as: XALATAN Place 1 drop into the right eye 2 (two) times daily.   levothyroxine 137 MCG tablet Commonly known as: Synthroid TAKE 1 TABLET (137 MCG TOTAL) BY MOUTH DAILY BEFORE BREAKFAST. What changed:  how much to take how to take this when to take this additional instructions   methotrexate 2.5 MG tablet Commonly known as: RHEUMATREX Take 10 tablets (25 mg total) by mouth every Tuesday.   multivitamin with minerals tablet Take 1 tablet by mouth daily.   naproxen sodium 220 MG tablet Commonly known as: ALEVE Take 220 mg by mouth daily as needed (pain).   NON FORMULARY CPAP nightly   OXYGEN Inhale 2 L into the lungs daily as needed (During walking and activity).   potassium citrate 10 MEQ (1080 MG) SR tablet Commonly known as: UROCIT-K Take 20 mEq by mouth 2 (two) times daily.   predniSONE 10 MG tablet Commonly known as: DELTASONE Take 2 tablets (20 mg total) by mouth daily. Resume only after the finishing new Rx for prednisone What changed:  how much to take additional instructions    predniSONE 20 MG tablet Commonly known as: DELTASONE Take 2 tablets (40 mg total) by mouth daily for 3 days. Start taking on: October 27, 2021 What changed: You were already taking a medication with the same name, and this prescription was added. Make sure you understand how and when to take each.   PROBIOTIC PO Take 1 capsule by mouth daily.   Treprostinil 0.6 MG/ML Soln Commonly known as: TYVASO Inhale into the lungs See admin instructions. 12 breaths 4 times a day   zinc sulfate 220 (50 Zn) MG capsule Take 1 capsule (220 mg total) by mouth daily for 7 days. Start taking on: October 27, 2021        Follow-up Information     Glean Hess, MD. Schedule an appointment as soon as possible for a visit in 1 week(s).   Specialty: Internal Medicine Contact information: [address] [place] [address] [phone]  Allergies  Allergen Reactions   Nitrofurantoin Nausea Only   Plaquenil [Hydroxychloroquine]     Unknown reaction    Tramadol Nausea And Vomiting   Sulfa Antibiotics Rash    As an infant    Consultations: pulmonology   Procedures/Studies: CT Angio Chest PE W and/or Wo Contrast  Result Date: 10/19/2021 CLINICAL DATA:  Recent history of COVID with worsening shortness of breath. EXAM: CT ANGIOGRAPHY CHEST WITH CONTRAST TECHNIQUE: Multidetector CT imaging of the chest was performed using the standard protocol during bolus administration of intravenous contrast. Multiplanar CT image reconstructions and MIPs were obtained to evaluate the vascular anatomy. CONTRAST:  51m OMNIPAQUE IOHEXOL 350 MG/ML SOLN COMPARISON:  February 03, 2021 FINDINGS: Cardiovascular: There is moderate severity calcification of the aortic arch, without evidence of aortic aneurysm or dissection. Satisfactory opacification of the pulmonary arteries to the segmental level. No evidence of pulmonary embolism. Normal heart size. No pericardial effusion. Mediastinum/Nodes: No  enlarged mediastinal, hilar, or axillary lymph nodes. Thyroid gland, trachea, and esophagus demonstrate no significant findings. Lungs/Pleura: Stable, marked severity, coarse interstitial thickening and architectural distortion is seen involving predominantly the bilateral upper lobes, right middle lobe and superior aspect of the left lower lobe. Stable areas of bronchiectasis and perivascular nodularity are also noted within these areas. Stable areas of scarring and/or atelectasis are seen throughout the lower lung fields, bilaterally with stable subpleural cysts seen along the periphery of the mid and lower lung fields. 4 mm and 8 mm ill-defined subsolid lung nodules are seen within the posterior aspect of the left lower lobe (axial CT images 50 through 63, CT series 6). These are not clearly identified on the prior study. There is no evidence of a pleural effusion or pneumothorax. Upper Abdomen: There is a small hiatal hernia. Musculoskeletal: Evidence of prior vertebroplasty is seen throughout multiple lower thoracic and upper lumbar spine vertebra. Review of the MIP images confirms the above findings. IMPRESSION: 1. No evidence of pulmonary embolism. 2. Stable findings consistent with end-stage sarcoid. 3. 4 mm and 8 mm ill-defined subsolid left lower lobe lung nodules. Non-contrast chest CT at 3-6 months is recommended. If nodules persist and are stable at that time, consider additional non-contrast chest CT examinations at 2 and 4 years. This recommendation follows the consensus statement: Guidelines for Management of Incidental Pulmonary Nodules Detected on CT Images: From the Fleischner Society 2017; Radiology 2017; 284:228-243. 4. Small hiatal hernia. 5. Evidence of prior vertebroplasty throughout multiple lower thoracic and upper lumbar spine vertebra. 6. Aortic atherosclerosis. Aortic Atherosclerosis (ICD10-I70.0). Electronically Signed   By: TVirgina NorfolkM.D.   On: 10/19/2021 23:06   DG Chest  Port 1 View  Result Date: 10/25/2021 CLINICAL DATA:  Atelectasis, COVID-19 positive 6 days prior EXAM: PORTABLE CHEST 1 VIEW COMPARISON:  10/23/2021 chest radiograph. FINDINGS: Vertebroplasty material noted over several mid to lower thoracic vertebrae. Stable cardiomediastinal silhouette with normal heart size. No pneumothorax. Chronic blunting of the left costophrenic angle. No right pleural effusion. Patchy reticular opacities throughout both lungs, most prominent in the apical left lung and basilar right lung with associated volume loss and distortion, unchanged. No superimposed acute consolidative airspace disease. IMPRESSION: Chronic fibrotic interstitial lung disease. No superimposed acute cardiopulmonary disease. Electronically Signed   By: JIlona SorrelM.D.   On: 10/25/2021 10:09   DG Chest Port 1 View  Result Date: 10/23/2021 CLINICAL DATA:  66year old female with chronic lung disease. Recent increasing shortness of breath. EXAM: PORTABLE CHEST 1 VIEW COMPARISON:  Chest CTA 10/19/2021  and earlier. FINDINGS: Portable AP upright view at 0922 hours. Reduced lung volumes and upper lung predominant pulmonary fibrosis. Stable lung volumes and ventilation since 10/19/2021. Stable cardiac size and mediastinal contours. Multilevel augmented spinal compression fractures. Tracheal air column within normal limits. Paucity of bowel gas in the upper abdomen. IMPRESSION: Stable chronic lung disease. No superimposed acute findings are identified. Electronically Signed   By: Genevie Ann M.D.   On: 10/23/2021 09:40   DG Chest Port 1 View  Result Date: 10/19/2021 CLINICAL DATA:  History of COVID positive with shortness of breath. EXAM: PORTABLE CHEST 1 VIEW COMPARISON:  May 06, 2021 FINDINGS: Diffusely increased interstitial lung markings and extensive bilateral lung opacities are seen. This is stable in appearance when compared to the prior study. Mild areas of linear scarring and/or atelectasis are seen within the mid  right lung which represents a new finding. No pneumothorax is identified. The heart size and mediastinal contours are within normal limits. Evidence of prior vertebroplasty is seen at the levels of T8, T9, T10 and T11. IMPRESSION: 1. Stable, extensive chronic findings consistent with sarcoidosis. 2. Interval development of mild mid right lung scarring and/or atelectasis. Electronically Signed   By: Virgina Norfolk M.D.   On: 10/19/2021 20:46   DG Foot Complete Left  Result Date: 10/20/2021 CLINICAL DATA:  Nonhealing wound at the base of the first metatarsal EXAM: LEFT FOOT - COMPLETE 3+ VIEW COMPARISON:  MRI examination dated May 08, 2021 FINDINGS: Postsurgical changes for prior amputation through the first metatarsal head, second metatarsal neck and third metatarsophalangeal joint. There are osseous erosions at the distal aspect of the first and second metatarsal surgical site with prominent subcutaneous soft tissue edema. Findings are suspicious for osteomyelitis, further evaluation with MR examination is recommended. IMPRESSION: Findings suspicious for osteomyelitis of the first and second metatarsals, further evaluation with MR examination would be helpful if clinically warranted. Electronically Signed   By: Keane Police D.O.   On: 10/20/2021 11:44      Subjective: Patient seen and examined at the bedside this morning.  Hemodynamically stable for discharge today.  Discharge Exam: Vitals:   10/26/21 1116 10/26/21 1121  BP: 114/65 100/88  Pulse: (!) 111 (!) 121  Resp: 18 19  Temp:  98.3 F (36.8 C)  SpO2: 96% 94%   Vitals:   10/26/21 0755 10/26/21 1111 10/26/21 1116 10/26/21 1121  BP: 111/64 125/71 114/65 100/88  Pulse: 99 98 (!) 111 (!) 121  Resp:  _0 Temp: 97.7 F (36.5 C)   98.3 F (36.8 C)  TempSrc: Oral   Oral  SpO2: 97% 100% 96% 94%  Weight:      Height:        General: Pt is alert, awake, not in acute distress Cardiovascular: RRR, S1/S2 +, no rubs, no  gallops Respiratory: CTA bilaterally, no wheezing, no rhonchi Abdominal: Soft, NT, ND, bowel sounds + Extremities: no edema, no cyanosis    The results of significant diagnostics from this hospitalization (including imaging, microbiology, ancillary and laboratory) are listed below for reference.     Microbiology: Recent Results (from the past 240 hour(s))  Resp Panel by RT-PCR (Flu A&B, Covid) Nasopharyngeal Swab     Status: Abnormal   Collection Time: 10/19/21  8:24 PM   Specimen: Nasopharyngeal Swab; Nasopharyngeal(NP) swabs in vial transport medium  Result Value Ref Range Status   SARS Coronavirus 2 by RT PCR POSITIVE (A) NEGATIVE Final    Comment: (NOTE) SARS-CoV-2 target nucleic  acids are DETECTED.  The SARS-CoV-2 RNA is generally detectable in upper respiratory specimens during the acute phase of infection. Positive results are indicative of the presence of the identified virus, but do not rule out bacterial infection or co-infection with other pathogens not detected by the test. Clinical correlation with patient history and other diagnostic information is necessary to determine patient infection status. The expected result is Negative.  Fact Sheet for Patients: EntrepreneurPulse.com.au  Fact Sheet for Healthcare Providers: IncredibleEmployment.be  This test is not yet approved or cleared by the [place] FDA and  has been authorized for detection and/or diagnosis of SARS-CoV-2 by FDA under an Emergency Use Authorization (EUA).  This EUA will remain in effect (meaning this test can be used) for the duration of  the COVID-19 declaration under Section 564(b)(1) of the A ct, 21 U.S.C. section 360bbb-3(b)(1), unless the authorization is terminated or revoked sooner.     Influenza A by PCR NEGATIVE NEGATIVE Final   Influenza B by PCR NEGATIVE NEGATIVE Final    Comment: (NOTE) The Xpert Xpress SARS-CoV-2/FLU/RSV plus assay is  intended as an aid in the diagnosis of influenza from Nasopharyngeal swab specimens and should not be used as a sole basis for treatment. Nasal washings and aspirates are unacceptable for Xpert Xpress SARS-CoV-2/FLU/RSV testing.  Fact Sheet for Patients: EntrepreneurPulse.com.au  Fact Sheet for Healthcare Providers: IncredibleEmployment.be  This test is not yet approved or cleared by the [place] FDA and has been authorized for detection and/or diagnosis of SARS-CoV-2 by FDA under an Emergency Use Authorization (EUA). This EUA will remain in effect (meaning this test can be used) for the duration of the COVID-19 declaration under Section 564(b)(1) of the Act, 21 U.S.C. section 360bbb-3(b)(1), unless the authorization is terminated or revoked.  Performed at [facility], [address] [place], [place] [address]   Culture, blood (Routine X 2) w Reflex to ID Panel     Status: None   Collection Time: 10/21/21  7:21 AM   Specimen: Right Antecubital  Result Value Ref Range Status   Specimen Description RIGHT ANTECUBITAL  Final   Special Requests   Final    BOTTLES DRAWN AEROBIC AND ANAEROBIC Blood Culture adequate volume   Culture   Final    NO GROWTH 5 DAYS Performed at [facility], [address] [place], [place] [address]    Report Status 10/26/2021 FINAL  Final  Culture, blood (Routine X 2) w Reflex to ID Panel     Status: None   Collection Time: 10/21/21  7:23 AM   Specimen: BLOOD RIGHT ARM  Result Value Ref Range Status   Specimen Description BLOOD RIGHT ARM  Final   Special Requests   Final    BOTTLES DRAWN AEROBIC ONLY Blood Culture adequate volume   Culture   Final    NO GROWTH 5 DAYS Performed at [facility], [address] [place], [place] [address]    Report Status 10/26/2021 FINAL  Final     Labs: BNP (last 3 results) Recent Labs    10/20/21 0806  BNP 793.9*   Basic Metabolic  Panel: Recent Labs  Lab 10/21/21 0720 10/22/21 1023 10/23/21 0911 10/23/21 1952 10/24/21 0507 10/26/21 0723  NA 144 141 139  --  138 138  K 4.6 4.2 4.4  --  4.0 4.5  CL 98 95* 98  --  97* 92*  CO2 36* 36* 34*  --  33* 36*  GLUCOSE 151* 195* 139*  --  111* 114*  BUN 39* 44* 40*  --  37* 44*  CREATININE 0.99 0.99 0.87  --  0.79 1.27*  CALCIUM 8.5* 8.5* 8.5*  --  8.5* 9.3  MG 2.3  --  2.2  --  2.1  --   PHOS 3.1  --  1.8* 3.6 2.8  --    Liver Function Tests: Recent Labs  Lab 10/21/21 0720 10/23/21 0911 10/24/21 0507  AST _0 ALT 49* 30 29  ALKPHOS 71 54 54  BILITOT 0.6 0.6 0.4  PROT 6.9 6.4* 6.1*  ALBUMIN 2.5* 2.8* 2.9*   No results for input(s): LIPASE, AMYLASE in the last 168 hours. No results for input(s): AMMONIA in the last 168 hours. CBC: Recent Labs  Lab 10/20/21 0806 10/21/21 1621 10/23/21 0742 10/24/21 0507 10/26/21 0723  WBC 2.3* 2.2* 2.1* 3.2* 4.6  NEUTROABS  --  2.1  --   --  3.6  HGB 10.2* 10.4* 10.9* 11.0* 12.1  HCT 33.0* 33.1* 35.2* 34.8* 38.4  MCV 95.9 94.3 93.6 93.3 93.4  PLT 356 452* 499* 465* 480*   Cardiac Enzymes: No results for input(s): CKTOTAL, CKMB, CKMBINDEX, TROPONINI in the last 168 hours. BNP: Invalid input(s): POCBNP CBG: No results for input(s): GLUCAP in the last 168 hours. D-Dimer Recent Labs    10/24/21 0507 10/25/21 0704  DDIMER 1.63* 1.72*   Hgb A1c No results for input(s): HGBA1C in the last 72 hours. Lipid Profile No results for input(s): CHOL, HDL, LDLCALC, TRIG, CHOLHDL, LDLDIRECT in the last 72 hours. Thyroid function studies No results for input(s): TSH, T4TOTAL, T3FREE, THYROIDAB in the last 72 hours.  Invalid input(s): FREET3 Anemia work up Recent Labs    10/24/21 0507 10/25/21 0704  FERRITIN 210 220   Urinalysis    Component Value Date/Time   COLORURINE STRAW (A) 05/06/2021 1616   APPEARANCEUR CLEAR (A) 05/06/2021 1616   APPEARANCEUR Clear 04/21/2021 1551   LABSPEC 1.010 05/06/2021 1616    PHURINE 8.0 05/06/2021 1616   GLUCOSEU NEGATIVE 05/06/2021 1616   HGBUR SMALL (A) 05/06/2021 1616   BILIRUBINUR NEGATIVE 05/06/2021 1616   BILIRUBINUR Negative 04/21/2021 1551   KETONESUR NEGATIVE 05/06/2021 1616   PROTEINUR NEGATIVE 05/06/2021 1616   UROBILINOGEN 0.2 01/07/2020 1406   UROBILINOGEN 0.2 11/25/2014 1849   NITRITE NEGATIVE 05/06/2021 1616   LEUKOCYTESUR NEGATIVE 05/06/2021 1616   Sepsis Labs Invalid input(s): PROCALCITONIN,  WBC,  LACTICIDVEN Microbiology Recent Results (from the past 240 hour(s))  Resp Panel by RT-PCR (Flu A&B, Covid) Nasopharyngeal Swab     Status: Abnormal   Collection Time: 10/19/21  8:24 PM   Specimen: Nasopharyngeal Swab; Nasopharyngeal(NP) swabs in vial transport medium  Result Value Ref Range Status   SARS Coronavirus 2 by RT PCR POSITIVE (A) NEGATIVE Final    Comment: (NOTE) SARS-CoV-2 target nucleic acids are DETECTED.  The SARS-CoV-2 RNA is generally detectable in upper respiratory specimens during the acute phase of infection. Positive results are indicative of the presence of the identified virus, but do not rule out bacterial infection or co-infection with other pathogens not detected by the test. Clinical correlation with patient history and other diagnostic information is necessary to determine patient infection status. The expected result is Negative.  Fact Sheet for Patients: EntrepreneurPulse.com.au  Fact Sheet for Healthcare Providers: IncredibleEmployment.be  This test is not yet approved or cleared by the [place] FDA and  has been authorized for detection and/or diagnosis of SARS-CoV-2 by FDA under an Emergency Use Authorization (EUA).  This EUA will remain in effect (meaning this test can be used) for the duration of  the COVID-19 declaration under Section 564(b)(1) of the A ct, 21 U.S.C. section 360bbb-3(b)(1), unless the authorization is terminated or revoked sooner.      Influenza A by PCR NEGATIVE NEGATIVE Final   Influenza B by PCR NEGATIVE NEGATIVE Final    Comment: (NOTE) The Xpert Xpress SARS-CoV-2/FLU/RSV plus assay is intended as an aid in the diagnosis of influenza from Nasopharyngeal swab specimens and should not be used as a sole basis for treatment. Nasal washings and aspirates are unacceptable for Xpert Xpress SARS-CoV-2/FLU/RSV testing.  Fact Sheet for Patients: EntrepreneurPulse.com.au  Fact Sheet for Healthcare Providers: IncredibleEmployment.be  This test is not yet approved or cleared by the [place] FDA and has been authorized for detection and/or diagnosis of SARS-CoV-2 by FDA under an Emergency Use Authorization (EUA). This EUA will remain in effect (meaning this test can be used) for the duration of the COVID-19 declaration under Section 564(b)(1) of the Act, 21 U.S.C. section 360bbb-3(b)(1), unless the authorization is terminated or revoked.  Performed at [facility], [address] [place], [place] [address]   Culture, blood (Routine X 2) w Reflex to ID Panel     Status: None   Collection Time: 10/21/21  7:21 AM   Specimen: Right Antecubital  Result Value Ref Range Status   Specimen Description RIGHT ANTECUBITAL  Final   Special Requests   Final    BOTTLES DRAWN AEROBIC AND ANAEROBIC Blood Culture adequate volume   Culture   Final    NO GROWTH 5 DAYS Performed at [facility], [address], [place], [place] [address]    Report Status 10/26/2021 FINAL  Final  Culture, blood (Routine X 2) w Reflex to ID Panel     Status: None   Collection Time: 10/21/21  7:23 AM   Specimen: BLOOD RIGHT ARM  Result Value Ref Range Status   Specimen Description BLOOD RIGHT ARM  Final   Special Requests   Final    BOTTLES DRAWN AEROBIC ONLY Blood Culture adequate volume   Culture   Final    NO GROWTH 5 DAYS Performed at [facility], [address],  [place], [place] [address]    Report Status 10/26/2021 FINAL  Final    Please note: You were cared for by a hospitalist during your hospital stay. Once you are discharged, your primary care physician will handle any further medical issues. Please note that NO REFILLS for any discharge medications will be authorized once you are discharged, as it is imperative that you return to your primary care physician (or establish a relationship with a primary care physician if you do not have one) for your post hospital discharge needs so that they can reassess your need for medications and monitor your lab values.    Time coordinating discharge: 40 minutes  SIGNED:   Shelly Coss, MD  [facility] 10/26/2021, 12:43 PM Pager [phone]  If 7PM-7AM, please contact night-coverage [email or web address] Password [email or web address]

## 2021-10-27 ENCOUNTER — Telehealth: Payer: Self-pay

## 2021-10-27 ENCOUNTER — Encounter: Payer: Self-pay | Admitting: Internal Medicine

## 2021-10-27 ENCOUNTER — Other Ambulatory Visit: Payer: Self-pay | Admitting: Internal Medicine

## 2021-10-27 DIAGNOSIS — R110 Nausea: Secondary | ICD-10-CM

## 2021-10-27 MED ORDER — ONDANSETRON HCL 4 MG PO TABS: 4.0000 mg | ORAL_TABLET | Freq: Three times a day (TID) | ORAL | 0 refills | Status: DC | PRN

## 2021-10-27 NOTE — Telephone Encounter (Signed)
Transition Care Management Unsuccessful Follow-up Telephone Call  Date of discharge and from where:  10/26/21 [facility]  Attempts:  1st Attempt  Reason for unsuccessful TCM follow-up call:  Left voice message

## 2021-11-24 DIAGNOSIS — D869 Sarcoidosis, unspecified: Secondary | ICD-10-CM | POA: Diagnosis not present

## 2021-11-24 DIAGNOSIS — R0602 Shortness of breath: Secondary | ICD-10-CM | POA: Diagnosis not present

## 2021-12-01 DIAGNOSIS — G4733 Obstructive sleep apnea (adult) (pediatric): Secondary | ICD-10-CM | POA: Diagnosis not present

## 2021-12-02 ENCOUNTER — Other Ambulatory Visit: Payer: Self-pay | Admitting: Internal Medicine

## 2021-12-02 DIAGNOSIS — E039 Hypothyroidism, unspecified: Secondary | ICD-10-CM

## 2021-12-02 NOTE — Telephone Encounter (Signed)
Requested medications are due for refill today.  unsure  Requested medications are on the active medications list.  yes  Last refill. 12/08/2020 #90 3 refills  Future visit scheduled.   no  Notes to clinic.  Refill failed protocol d/t abnormal and expired labs.    Requested Prescriptions  Pending Prescriptions Disp Refills   levothyroxine (SYNTHROID) 137 MCG tablet [Pharmacy Med Name: LEVOTHYROXINE 137 MCG TABLET] 90 tablet 3    Sig: TAKE 1 TABLET BY MOUTH DAILY BEFORE BREAKFAST.     Endocrinology:  Hypothyroid Agents Failed - 12/02/2021  1:44 AM      Failed - TSH in normal range and within 360 days    TSH  Date Value Ref Range Status  07/10/2020 0.332 (L) 0.350 - 4.500 uIU/mL Final    Comment:    Performed by a 3rd Generation assay with a functional sensitivity of <=0.01 uIU/mL. Performed at [facility], [address] [place], [place] [address]   05/27/2019 0.120 (L) 0.450 - 4.500 uIU/mL Final          Passed - Valid encounter within last 12 months    Recent Outpatient Visits           1 month ago Bronchitis due to COVID-19 virus   [facility] Glean Hess, MD   2 months ago COVID-19 virus infection   [facility] Glean Hess, MD   1 year ago Cerebrovascular accident (CVA) due to embolism of cerebral artery [facility])   [facility] Glean Hess, MD   1 year ago Acute cystitis without hematuria   [facility] Glean Hess, MD   2 years ago Acute cystitis with hematuria   [facility] Glean Hess, MD

## 2021-12-03 DIAGNOSIS — R0600 Dyspnea, unspecified: Secondary | ICD-10-CM | POA: Diagnosis not present

## 2021-12-03 DIAGNOSIS — R0689 Other abnormalities of breathing: Secondary | ICD-10-CM | POA: Diagnosis not present

## 2021-12-22 DIAGNOSIS — R0602 Shortness of breath: Secondary | ICD-10-CM | POA: Diagnosis not present

## 2021-12-22 DIAGNOSIS — D869 Sarcoidosis, unspecified: Secondary | ICD-10-CM | POA: Diagnosis not present

## 2021-12-29 DIAGNOSIS — G4733 Obstructive sleep apnea (adult) (pediatric): Secondary | ICD-10-CM | POA: Diagnosis not present

## 2022-01-22 DIAGNOSIS — R0602 Shortness of breath: Secondary | ICD-10-CM | POA: Diagnosis not present

## 2022-01-22 DIAGNOSIS — D869 Sarcoidosis, unspecified: Secondary | ICD-10-CM | POA: Diagnosis not present

## 2022-01-29 DIAGNOSIS — G4733 Obstructive sleep apnea (adult) (pediatric): Secondary | ICD-10-CM | POA: Diagnosis not present

## 2022-01-31 ENCOUNTER — Other Ambulatory Visit: Payer: Self-pay | Admitting: Internal Medicine

## 2022-01-31 DIAGNOSIS — R110 Nausea: Secondary | ICD-10-CM

## 2022-02-01 NOTE — Telephone Encounter (Signed)
Requested medication (s) are due for refill today: no ? ?Requested medication (s) are on the active medication list: yes ? ?Last refill:  10/27/21 ? ?Future visit scheduled: no ? ?Notes to clinic:  Unable to refill per protocol, cannot delegate. ? ? ? ?  ?Requested Prescriptions  ?Pending Prescriptions Disp Refills  ? ondansetron (ZOFRAN) 4 MG tablet [Pharmacy Med Name: ONDANSETRON HCL 4 MG TABLET] 20 tablet 0  ?  Sig: TAKE 1 TABLET BY MOUTH EVERY 8 HOURS AS NEEDED FOR NAUSEA AND VOMITING  ?  ? Not Delegated - Gastroenterology: Antiemetics - ondansetron Failed - 01/31/2022 12:47 PM  ?  ?  Failed - This refill cannot be delegated  ?  ?  Passed - AST in normal range and within 360 days  ?  AST  ?Date Value Ref Range Status  ?10/24/2021 18 15 - 41 U/L Final  ? ?SGOT(AST)  ?Date Value Ref Range Status  ?12/26/2012 25 15 - 37 Unit/L Final  ?  ?  ?  ?  Passed - ALT in normal range and within 360 days  ?  ALT  ?Date Value Ref Range Status  ?10/24/2021 29 0 - 44 U/L Final  ? ?SGPT (ALT)  ?Date Value Ref Range Status  ?12/26/2012 20 12 - 78 U/L Final  ?  ?  ?  ?  Passed - Valid encounter within last 6 months  ?  Recent Outpatient Visits   ? ?      ? 3 months ago Bronchitis due to COVID-19 virus  ? [facility] Glean Hess, MD  ? 4 months ago COVID-19 virus infection  ? [facility] Glean Hess, MD  ? 1 year ago Cerebrovascular accident (CVA) due to embolism of cerebral artery [facility])  ? [facility] Glean Hess, MD  ? 2 years ago Acute cystitis without hematuria  ? [facility] Glean Hess, [facility]  ? 2 years ago Acute cystitis with hematuria  ? [facility] Glean Hess, MD  ? ?  ?  ? ? ?  ?  ?  ? ? ?

## 2022-02-09 ENCOUNTER — Other Ambulatory Visit: Payer: Self-pay | Admitting: Internal Medicine

## 2022-02-10 DIAGNOSIS — G4733 Obstructive sleep apnea (adult) (pediatric): Secondary | ICD-10-CM | POA: Diagnosis not present

## 2022-02-10 DIAGNOSIS — I272 Pulmonary hypertension, unspecified: Secondary | ICD-10-CM | POA: Diagnosis not present

## 2022-02-10 DIAGNOSIS — Z9989 Dependence on other enabling machines and devices: Secondary | ICD-10-CM | POA: Diagnosis not present

## 2022-02-10 NOTE — Telephone Encounter (Signed)
Requested Prescriptions  ?Pending Prescriptions Disp Refills  ?? escitalopram (LEXAPRO) 20 MG tablet [Pharmacy Med Name: ESCITALOPRAM 20 MG TABLET] 90 tablet 1  ?  Sig: TAKE 1 TABLET BY MOUTH EVERY DAY  ?  ? Psychiatry:  Antidepressants - SSRI Passed - 02/09/2022 11:34 AM  ?  ?  Passed - Completed PHQ-2 or PHQ-9 in the last 360 days  ?  ?  Passed - Valid encounter within last 6 months  ?  Recent Outpatient Visits   ?      ? 4 months ago Bronchitis due to COVID-19 virus  ? [facility] Glean Hess, MD  ? 4 months ago COVID-19 virus infection  ? [facility] Glean Hess, MD  ? 1 year ago Cerebrovascular accident (CVA) due to embolism of cerebral artery [facility])  ? [facility] Glean Hess, MD  ? 2 years ago Acute cystitis without hematuria  ? [facility] Glean Hess, MD  ? 2 years ago Acute cystitis with hematuria  ? [facility] Glean Hess, MD  ?  ?  ? ?  ?  ?  ? ?

## 2022-02-11 DIAGNOSIS — H401112 Primary open-angle glaucoma, right eye, moderate stage: Secondary | ICD-10-CM | POA: Diagnosis not present

## 2022-02-12 ENCOUNTER — Other Ambulatory Visit: Payer: Self-pay | Admitting: Pulmonary Disease

## 2022-02-12 DIAGNOSIS — I272 Pulmonary hypertension, unspecified: Secondary | ICD-10-CM

## 2022-02-21 DIAGNOSIS — D869 Sarcoidosis, unspecified: Secondary | ICD-10-CM | POA: Diagnosis not present

## 2022-02-21 DIAGNOSIS — R0602 Shortness of breath: Secondary | ICD-10-CM | POA: Diagnosis not present

## 2022-02-22 ENCOUNTER — Ambulatory Visit
Admission: RE | Admit: 2022-02-22 | Discharge: 2022-02-22 | Disposition: A | Discharge status: Home / Self Care | Payer: BC Managed Care – PPO | Source: Ambulatory Visit | Attending: Pulmonary Disease | Admitting: Pulmonary Disease

## 2022-02-22 DIAGNOSIS — I272 Pulmonary hypertension, unspecified: Secondary | ICD-10-CM | POA: Insufficient documentation

## 2022-02-22 DIAGNOSIS — N2889 Other specified disorders of kidney and ureter: Secondary | ICD-10-CM | POA: Diagnosis not present

## 2022-02-22 DIAGNOSIS — Z87442 Personal history of urinary calculi: Secondary | ICD-10-CM | POA: Diagnosis not present

## 2022-02-23 DIAGNOSIS — M359 Systemic involvement of connective tissue, unspecified: Secondary | ICD-10-CM | POA: Diagnosis not present

## 2022-02-23 DIAGNOSIS — I270 Primary pulmonary hypertension: Secondary | ICD-10-CM | POA: Diagnosis not present

## 2022-02-23 DIAGNOSIS — I2721 Secondary pulmonary arterial hypertension: Secondary | ICD-10-CM | POA: Diagnosis not present

## 2022-02-28 ENCOUNTER — Other Ambulatory Visit: Payer: Self-pay | Admitting: Internal Medicine

## 2022-02-28 DIAGNOSIS — E039 Hypothyroidism, unspecified: Secondary | ICD-10-CM

## 2022-03-01 NOTE — Telephone Encounter (Signed)
Requested medications are due for refill today.  yes ? ?Requested medications are on the active medications list.  yes ? ?Last refill. 12/02/2021 #90 0 refills ? ?Future visit scheduled.   no ? ?Notes to clinic.  Labs are expired. ? ? ? ?Requested Prescriptions  ?Pending Prescriptions Disp Refills  ? levothyroxine (SYNTHROID) 137 MCG tablet [Pharmacy Med Name: LEVOTHYROXINE 137 MCG TABLET] 90 tablet 0  ?  Sig: TAKE 1 TABLET BY MOUTH EVERY DAY BEFORE BREAKFAST  ?  ? Endocrinology:  Hypothyroid Agents Failed - 02/28/2022  2:23 AM  ?  ?  Failed - TSH in normal range and within 360 days  ?  TSH  ?Date Value Ref Range Status  ?07/10/2020 0.332 (L) 0.350 - 4.500 uIU/mL Final  ?  Comment:  ?  Performed by a 3rd Generation assay with a functional sensitivity of <=0.01 uIU/mL. ?Performed at [facility], [address] [place], [place] [address] ?  ?05/27/2019 0.120 (L) 0.450 - 4.500 uIU/mL Final  ?   ?  ?  Passed - Valid encounter within last 12 months  ?  Recent Outpatient Visits   ? ?      ? 4 months ago Bronchitis due to COVID-19 virus  ? [facility] Glean Hess, MD  ? 5 months ago COVID-19 virus infection  ? [facility] Glean Hess, MD  ? 1 year ago Cerebrovascular accident (CVA) due to embolism of cerebral artery [facility])  ? [facility] Glean Hess, MD  ? 2 years ago Acute cystitis without hematuria  ? [facility] Glean Hess, MD  ? 2 years ago Acute cystitis with hematuria  ? [facility] Glean Hess, MD  ? ?  ?  ? ? ?  ?  ?  ?  ?

## 2022-03-13 DIAGNOSIS — G4733 Obstructive sleep apnea (adult) (pediatric): Secondary | ICD-10-CM | POA: Diagnosis not present

## 2022-03-16 DIAGNOSIS — I10 Essential (primary) hypertension: Secondary | ICD-10-CM | POA: Diagnosis not present

## 2022-03-16 DIAGNOSIS — G4733 Obstructive sleep apnea (adult) (pediatric): Secondary | ICD-10-CM | POA: Diagnosis not present

## 2022-03-24 DIAGNOSIS — R0602 Shortness of breath: Secondary | ICD-10-CM | POA: Diagnosis not present

## 2022-03-24 DIAGNOSIS — D869 Sarcoidosis, unspecified: Secondary | ICD-10-CM | POA: Diagnosis not present

## 2022-03-25 DIAGNOSIS — H401112 Primary open-angle glaucoma, right eye, moderate stage: Secondary | ICD-10-CM | POA: Diagnosis not present

## 2022-04-01 DIAGNOSIS — R0602 Shortness of breath: Secondary | ICD-10-CM | POA: Diagnosis not present

## 2022-04-01 DIAGNOSIS — I272 Pulmonary hypertension, unspecified: Secondary | ICD-10-CM | POA: Diagnosis not present

## 2022-04-04 DIAGNOSIS — I2721 Secondary pulmonary arterial hypertension: Secondary | ICD-10-CM | POA: Diagnosis not present

## 2022-04-05 DIAGNOSIS — G4733 Obstructive sleep apnea (adult) (pediatric): Secondary | ICD-10-CM | POA: Diagnosis not present

## 2022-04-15 DIAGNOSIS — I10 Essential (primary) hypertension: Secondary | ICD-10-CM | POA: Diagnosis not present

## 2022-04-15 DIAGNOSIS — G4733 Obstructive sleep apnea (adult) (pediatric): Secondary | ICD-10-CM | POA: Diagnosis not present

## 2022-05-05 DIAGNOSIS — G4733 Obstructive sleep apnea (adult) (pediatric): Secondary | ICD-10-CM | POA: Diagnosis not present

## 2022-05-07 ENCOUNTER — Other Ambulatory Visit: Payer: Self-pay | Admitting: Internal Medicine

## 2022-05-10 NOTE — Telephone Encounter (Signed)
Called pt - LMOM to return call for appt.

## 2022-05-10 NOTE — Telephone Encounter (Signed)
Courtesy refill. Patient will need an office visit for further refills. Requested Prescriptions  Pending Prescriptions Disp Refills  . escitalopram (LEXAPRO) 20 MG tablet [Pharmacy Med Name: ESCITALOPRAM 20 MG TABLET] 30 tablet 0    Sig: TAKE 1 TABLET BY MOUTH EVERY DAY     Psychiatry:  Antidepressants - SSRI Failed - 05/07/2022 10:07 PM      Failed - Valid encounter within last 6 months    Recent Outpatient Visits          6 months ago Bronchitis due to COVID-19 virus   [facility] Glean Hess, MD   7 months ago COVID-19 virus infection   [facility]   1 year ago Cerebrovascular accident (CVA) due to embolism of cerebral artery [facility])   [facility] Glean Hess, MD   2 years ago Acute cystitis without hematuria   [facility] Glean Hess, MD   2 years ago Acute cystitis with hematuria   [facility] Glean Hess, MD             Passed - Completed PHQ-2 or PHQ-9 in the last 360 days

## 2022-05-16 DIAGNOSIS — G4733 Obstructive sleep apnea (adult) (pediatric): Secondary | ICD-10-CM | POA: Diagnosis not present

## 2022-05-16 DIAGNOSIS — I10 Essential (primary) hypertension: Secondary | ICD-10-CM | POA: Diagnosis not present

## 2022-06-01 DIAGNOSIS — I272 Pulmonary hypertension, unspecified: Secondary | ICD-10-CM | POA: Diagnosis not present

## 2022-06-04 ENCOUNTER — Other Ambulatory Visit: Payer: Self-pay | Admitting: Internal Medicine

## 2022-06-04 DIAGNOSIS — E039 Hypothyroidism, unspecified: Secondary | ICD-10-CM

## 2022-06-05 DIAGNOSIS — G4733 Obstructive sleep apnea (adult) (pediatric): Secondary | ICD-10-CM | POA: Diagnosis not present

## 2022-06-06 ENCOUNTER — Other Ambulatory Visit: Payer: Self-pay

## 2022-06-06 NOTE — Telephone Encounter (Signed)
Requested medication (s) are due for refill today: yes  Requested medication (s) are on the active medication list: yes  Last refill:  03/01/22 #90  Future visit scheduled: yes  Notes to clinic:  overdue lab work has appt in 2 weeks   Requested Prescriptions  Pending Prescriptions Disp Refills   levothyroxine (SYNTHROID) 137 MCG tablet [Pharmacy Med Name: LEVOTHYROXINE 137 MCG TABLET] 90 tablet 0    Sig: TAKE 1 [place]     Endocrinology:  Hypothyroid Agents Failed - 06/04/2022  1:23 AM      Failed - TSH in normal range and within 360 days    TSH  Date Value Ref Range Status  07/10/2020 0.332 (L) 0.350 - 4.500 uIU/mL Final    Comment:    Performed by a 3rd Generation assay with a functional sensitivity of <=0.01 uIU/mL. Performed at [facility], [address] [place], [place] [address]   05/27/2019 0.120 (L) 0.450 - 4.500 uIU/mL Final         Passed - Valid encounter within last 12 months    Recent Outpatient Visits           7 months ago Bronchitis due to COVID-19 virus   [facility] at [facility], Jesse Sans, MD   8 months ago COVID-19 virus infection   [place] [facility] at [facility], Jesse Sans, MD   1 year ago Cerebrovascular accident (CVA) due to embolism of cerebral artery [facility])   [place] [facility] at [facility], Jesse Sans, MD   2 years ago Acute cystitis without hematuria   [facility] at [facility], Jesse Sans, MD   2 years ago Acute cystitis with hematuria   [facility] at [facility], Jesse Sans, MD       Future Appointments             In 2 weeks Army Melia Jesse Sans, MD [place] and Sports Medicine at [facility], [facility]

## 2022-06-19 ENCOUNTER — Other Ambulatory Visit: Payer: Self-pay | Admitting: Internal Medicine

## 2022-06-21 NOTE — Telephone Encounter (Signed)
Requested Prescriptions  Pending Prescriptions Disp Refills  . escitalopram (LEXAPRO) 20 MG tablet [Pharmacy Med Name: ESCITALOPRAM 20 MG TABLET] 90 tablet 0    Sig: TAKE 1 TABLET BY MOUTH EVERY DAY     Psychiatry:  Antidepressants - SSRI Failed - 06/19/2022  9:32 AM      Failed - Valid encounter within last 6 months    Recent Outpatient Visits          8 months ago Bronchitis due to COVID-19 virus   [place] [facility] at [facility], Jesse Sans, MD   8 months ago COVID-19 virus infection   [place] [facility] at [facility], Jesse Sans, MD   1 year ago Cerebrovascular accident (CVA) due to embolism of cerebral artery [facility])   [place] [facility] at [facility], Jesse Sans, MD   2 years ago Acute cystitis without hematuria   [place] [facility] at [facility], Jesse Sans, MD   2 years ago Acute cystitis with hematuria   [place] [facility] at [facility], Jesse Sans, MD      Future Appointments            In 3 days Glean Hess, MD [facility] and [facility] Medicine at [facility], [place] - Completed PHQ-2 or PHQ-9 in the last 360 days

## 2022-06-23 DIAGNOSIS — I10 Essential (primary) hypertension: Secondary | ICD-10-CM | POA: Diagnosis not present

## 2022-06-23 DIAGNOSIS — G4733 Obstructive sleep apnea (adult) (pediatric): Secondary | ICD-10-CM | POA: Diagnosis not present

## 2022-06-24 ENCOUNTER — Encounter: Payer: Self-pay | Admitting: Internal Medicine

## 2022-06-24 ENCOUNTER — Ambulatory Visit (INDEPENDENT_AMBULATORY_CARE_PROVIDER_SITE_OTHER): Payer: BC Managed Care – PPO | Admitting: Internal Medicine

## 2022-06-24 VITALS — BP 130/78 | HR 110 | Ht 67.0 in | Wt 178.0 lb

## 2022-06-24 DIAGNOSIS — E039 Hypothyroidism, unspecified: Secondary | ICD-10-CM

## 2022-06-24 DIAGNOSIS — Z23 Encounter for immunization: Secondary | ICD-10-CM | POA: Diagnosis not present

## 2022-06-24 DIAGNOSIS — Z131 Encounter for screening for diabetes mellitus: Secondary | ICD-10-CM

## 2022-06-24 DIAGNOSIS — D860 Sarcoidosis of lung: Secondary | ICD-10-CM | POA: Diagnosis not present

## 2022-06-24 DIAGNOSIS — F39 Unspecified mood [affective] disorder: Secondary | ICD-10-CM

## 2022-06-24 DIAGNOSIS — Z1231 Encounter for screening mammogram for malignant neoplasm of breast: Secondary | ICD-10-CM

## 2022-06-24 DIAGNOSIS — Z89422 Acquired absence of other left toe(s): Secondary | ICD-10-CM

## 2022-06-24 MED ORDER — ESCITALOPRAM OXALATE 20 MG PO TABS: 20.0000 mg | ORAL_TABLET | Freq: Every day | ORAL | 3 refills | Status: DC

## 2022-06-24 MED ORDER — LEVOTHYROXINE SODIUM 137 MCG PO TABS: 137.0000 ug | ORAL_TABLET | Freq: Every day | ORAL | 3 refills | Status: DC

## 2022-06-24 NOTE — Progress Notes (Signed)
Date:  06/24/2022   Name:  Stacie [name]   DOB:  Apr 28, 1956   MRN:  [id number]   Chief Complaint: Anxiety  Anxiety Presents for follow-up visit. Symptoms include decreased concentration, depressed mood and shortness of breath. Patient reports no chest pain, dizziness, excessive worry, irritability, nervous/anxious behavior or palpitations. Symptoms occur occasionally.   Compliance with medications is 76-100% (lexapro).  Thyroid Problem Presents for follow-up visit. Symptoms include depressed mood. Patient reports no anxiety, constipation, diarrhea, fatigue or palpitations. The symptoms have been stable.    Lab Results  Component Value Date   NA 138 10/26/2021   K 4.5 10/26/2021   CO2 36 (H) 10/26/2021   GLUCOSE 114 (H) 10/26/2021   BUN 44 (H) 10/26/2021   CREATININE 1.27 (H) 10/26/2021   CALCIUM 9.3 10/26/2021   GFRNONAA 47 (L) 10/26/2021   Lab Results  Component Value Date   CHOL 289 (H) 07/21/2020   HDL 33 (L) 07/21/2020   LDLCALC 198 (H) 07/21/2020   TRIG 290 (H) 07/21/2020   CHOLHDL 8.8 07/21/2020   Lab Results  Component Value Date   TSH 0.332 (L) 07/10/2020   Lab Results  Component Value Date   HGBA1C 6.4 (H) 07/16/2020   Lab Results  Component Value Date   WBC 4.6 10/26/2021   HGB 12.1 10/26/2021   HCT 38.4 10/26/2021   MCV 93.4 10/26/2021   PLT 480 (H) 10/26/2021   Lab Results  Component Value Date   ALT 29 10/24/2021   AST 18 10/24/2021   ALKPHOS 54 10/24/2021   BILITOT 0.4 10/24/2021   Lab Results  Component Value Date   VD25OH 28.3 (L) 05/09/2018     Review of Systems  Constitutional:  Negative for appetite change, chills, fatigue, irritability and unexpected weight change.  HENT:  Negative for nosebleeds.   Eyes:  Negative for visual disturbance.  Respiratory:  Positive for shortness of breath and wheezing. Negative for cough and chest tightness.   Cardiovascular:  Positive for leg swelling (mild intermittent). Negative for chest  pain and palpitations.  Gastrointestinal:  Negative for abdominal pain, constipation and diarrhea.  Genitourinary:  Negative for dysuria and hematuria.  Neurological:  Negative for dizziness, weakness, light-headedness and headaches.  Hematological:  Negative for adenopathy.  Psychiatric/Behavioral:  Positive for decreased concentration and dysphoric mood. Negative for sleep disturbance. The patient is not nervous/anxious.        Family has noticed mild decline in mental sharpness    Patient Active Problem List   Diagnosis Date Noted   Lumbar compression fracture ([place]) 05/11/2021   Open wound of foot 05/11/2021   Syncope and collapse 05/06/2021   Acquired absence of left great toe ([place]) 02/15/2021   Acquired absence of other left toe(s) ([place]) 02/15/2021   Acute on chronic respiratory failure with hypoxia ([place]) 01/31/2021   History of CVA (cerebrovascular accident) 01/31/2021   Gangrene of left foot ([place]) 11/03/2020   Labile blood glucose    Steroid-induced hyperglycemia    Thrombocytopenia (HCC)    Acute blood loss anemia    Slow transit constipation    Supplemental oxygen dependent    Klebsiella pneumoniae sepsis (HCC)    Cerebrovascular accident (CVA) due to embolism of cerebral artery ([place])    Sepsis ([place]) 07/10/2020   Pulmonary hypertension ([place]) 12/18/2018   Depression, major, recurrent, in partial remission ([place]) 10/22/2018   Compression fx, thoracic spine ([place]) 12/04/2017   Hyperlipidemia, mixed 04/21/2017   Cough variant asthma  vs UACS  02/20/2017   Hypothyroidism, acquired 03/27/2015   Essential hypertension 03/27/2015   Acquired absence of both cervix and uterus 03/27/2015   H/O renal calculi 03/27/2015   Allergic rhinitis 01/07/2015   Obstructive sleep apnea 09/29/2014   Other malaise and fatigue 06/03/2014   Asthma, chronic 02/24/2014   Cough 04/30/2013   Pulmonary sarcoidosis ([place]) 03/12/2013   CKD (chronic kidney disease) stage 3, GFR 30-59 ml/min (HCC)  03/12/2013   Calcium blood increased 05/23/2012    Allergies  Allergen Reactions   Nitrofurantoin Nausea Only   Plaquenil [Hydroxychloroquine]     Unknown reaction    Tramadol Nausea And Vomiting   Sulfa Antibiotics Rash    As an infant    Past Surgical History:  Procedure Laterality Date   ABDOMINAL HYSTERECTOMY     partial   AMPUTATION Left 01/14/2021   Procedure: AMPUTATION RAY (1ST TOE ) ( 2ND METATARSAL HEAD RESECTION);  Surgeon: Algernon Huxley, MD;  Location: [facility];  Service: Vascular;  Laterality: Left;   CARDIAC CATHETERIZATION  10/18/2018   [facility]   CATARACT EXTRACTION W/PHACO Left 07/31/2019   Procedure: CATARACT EXTRACTION PHACO AND INTRAOCULAR LENS PLACEMENT (IOC) LEFT 00:51.1  17.9%  9.15;  Surgeon: Leandrew Koyanagi, MD;  Location: [facility];  Service: Ophthalmology;  Laterality: Left;  keep this patient second   COLON SURGERY     "colon was fused to bladder - operated on both"   COLONOSCOPY  09/18/2007   diverticuli, no polyps   COLONOSCOPY  05/26/2010   diverticuli, no polyps   CYSTOSCOPY WITH STENT PLACEMENT Bilateral 07/14/2020   Procedure: CYSTOSCOPY WITH STENT PLACEMENT, RETROPYLOGRAM;  Surgeon: Billey Co, MD;  Location: [facility];  Service: Urology;  Laterality: Bilateral;   CYSTOSCOPY/URETEROSCOPY/HOLMIUM LASER/STENT PLACEMENT Left 02/20/2020   Procedure: CYSTOSCOPY/URETEROSCOPY/LITHOTRIPSY /STENT PLACEMENT;  Surgeon: Hollice Espy, MD;  Location: [facility];  Service: Urology;  Laterality: Left;   CYSTOSCOPY/URETEROSCOPY/HOLMIUM LASER/STENT PLACEMENT Bilateral 10/02/2020   Procedure: CYSTOSCOPY/URETEROSCOPY/HOLMIUM LASER/STENT PLACEMENT;  Surgeon: Billey Co, MD;  Location: [facility];  Service: Urology;  Laterality: Bilateral;   EYE SURGERY     KYPHOPLASTY N/A 05/10/2021   Procedure: KYPHOPLASTY, L2, L3, L4;  Surgeon: Hessie Knows, MD;  Location: [facility];  Service: Orthopedics;  Laterality: N/A;   KYPHOPLASTY N/A 06/25/2021   Procedure:  T10, T12, L1 KYPHOPLASTY;  Surgeon: Hessie Knows, MD;  Location: [facility];  Service: Orthopedics;  Laterality: N/A;   PARS PLANA VITRECTOMY Right 05/20/2015   Procedure: PARS PLANA VITRECTOMY WITH 25 GAUGE, laser;  Surgeon: Milus Height, MD;  Location: [facility];  Service: Ophthalmology;  Laterality: Right;   PARTIAL HYSTERECTOMY  1990   TEE WITHOUT CARDIOVERSION N/A 07/27/2020   Procedure: TRANSESOPHAGEAL ECHOCARDIOGRAM (TEE);  Surgeon: Teodoro Spray, MD;  Location: [facility];  Service: Cardiovascular;  Laterality: N/A;   TUBAL LIGATION     WISDOM TOOTH EXTRACTION      Social History   Tobacco Use   Smoking status: Never   Smokeless tobacco: Never  Vaping Use   Vaping Use: Never used  Substance Use Topics   Alcohol use: No   Drug use: No     Medication list has been reviewed and updated.  Current Meds  Medication Sig   acetaminophen (TYLENOL) 500 MG tablet Take 500 mg by mouth every 6 (six) hours as needed for moderate pain or headache.   denosumab (PROLIA) 60 MG/ML SOSY injection Inject 60 mg into the skin every 6 (six) months.   dorzolamide (TRUSOPT) 2 %  ophthalmic solution Place 1 drop into both eyes 2 (two) times daily.   escitalopram (LEXAPRO) 20 MG tablet TAKE 1 TABLET BY MOUTH EVERY DAY   Fluticasone-Umeclidin-Vilant (TRELEGY ELLIPTA) 200-62.5-25 MCG/ACT AEPB Inhale into the lungs.   latanoprost (XALATAN) 0.005 % ophthalmic solution Place 1 drop into the right eye 2 (two) times daily.   levothyroxine (SYNTHROID) 137 MCG tablet TAKE 1 TABLET BY MOUTH EVERY DAY BEFORE BREAKFAST   Menthol (ICY HOT) 5 % PTCH Apply 1 patch topically daily as needed (pain).   Multiple Vitamins-Minerals (MULTIVITAMIN WITH MINERALS) tablet Take 1 tablet by mouth daily.   naproxen sodium (ALEVE) 220 MG tablet Take 220 mg by mouth daily as needed (pain).   NON FORMULARY CPAP nightly   OXYGEN Inhale 2 L into the lungs daily as needed (During walking and activity).    predniSONE (DELTASONE) 10  MG tablet Take 2 tablets (20 mg total) by mouth daily. Resume only after the finishing new Rx for prednisone   Probiotic Product (PROBIOTIC PO) Take 1 capsule by mouth daily.   Treprostinil (TYVASO) 0.6 MG/ML SOLN Inhale into the lungs See admin instructions. 12 breaths 4 times a day   [DISCONTINUED] ondansetron (ZOFRAN) 4 MG tablet TAKE 1 TABLET BY MOUTH EVERY 8 HOURS AS NEEDED FOR NAUSEA AND VOMITING       06/24/2022    3:27 PM 10/13/2021   10:08 AM 09/28/2021    4:02 PM 09/21/2020    3:32 PM  GAD 7 : Generalized Anxiety Score  Nervous, Anxious, on Edge 0 0 0 0  Control/stop worrying 0 0 0 0  Worry too much - different things 0 0 0 0  Trouble relaxing 0 0 0 0  Restless 0 0 0 0  Easily annoyed or irritable 0 0 0 0  Afraid - awful might happen 0 0 0 0  Total GAD 7 Score 0 0 0 0  Anxiety Difficulty Not difficult at all Not difficult at all Not difficult at all Not difficult at all       06/24/2022    3:26 PM 10/13/2021   10:07 AM 09/28/2021    4:02 PM  Depression screen PHQ 2/9  Decreased Interest 0 0 0  Down, Depressed, Hopeless 0 0 0  PHQ - 2 Score 0 0 0  Altered sleeping 0 0 0  Tired, decreased energy 0 0 0  Change in appetite 0 0 0  Feeling bad or failure about yourself  0 0 0  Trouble concentrating 0 0 0  Moving slowly or fidgety/restless 0 0 0  Suicidal thoughts 0 0 0  PHQ-9 Score 0 0 0  Difficult doing work/chores Not difficult at all Not difficult at all Not difficult at all    BP Readings from Last 3 Encounters:  06/24/22 130/78  10/26/21 100/88  10/13/21 127/77    Physical Exam Vitals and nursing note reviewed.  Constitutional:      General: She is not in acute distress.    Appearance: She is well-developed.  HENT:     Head: Normocephalic and atraumatic.  Cardiovascular:     Rate and Rhythm: Normal rate and regular rhythm.  Pulmonary:     Effort: Accessory muscle usage and retractions present. No respiratory distress.     Breath sounds: Decreased air  movement present. Wheezing present.  Abdominal:     General: Abdomen is protuberant. Bowel sounds are normal.     Palpations: Abdomen is soft. There is no shifting dullness, fluid wave, hepatomegaly  or splenomegaly.     Tenderness: There is no abdominal tenderness.  Musculoskeletal:     Cervical back: Normal range of motion.     Right lower leg: No edema.     Left lower leg: No edema.  Lymphadenopathy:     Cervical: No cervical adenopathy.  Skin:    General: Skin is warm and dry.     Findings: No rash.  Neurological:     Mental Status: She is alert and oriented to person, place, and time.     Cranial Nerves: Cranial nerves 2-12 are intact.     Motor: Motor function is intact.  Psychiatric:        Attention and Perception: Attention normal.        Mood and Affect: Mood normal.        Behavior: Behavior normal.     Wt Readings from Last 3 Encounters:  06/24/22 178 lb (80.7 kg)  10/25/21 170 lb (77.1 kg)  10/13/21 170 lb (77.1 kg)    BP 130/78   Pulse (!) 110   Ht _0  (1.702 m)   Wt 178 lb (80.7 kg)   LMP  (LMP Unknown)   SpO2 90%   BMI 27.88 kg/m   Assessment and Plan: 1. Hypothyroidism, acquired Supplemented - check labs and advise - levothyroxine (SYNTHROID) 137 MCG tablet; Take 1 tablet (137 mcg total) by mouth daily before breakfast.  Dispense: 90 tablet; Refill: 3 - TSH + free T4  2. Pulmonary sarcoidosis ([place]) Improving with aggressive medication management by pulmonary - CBC with Differential/Platelet - Comprehensive metabolic panel  3. Mood disorder ([place]) Doing well. Continue current therapy; work on word games, puzzles and other mentally challenging exercises - escitalopram (LEXAPRO) 20 MG tablet; Take 1 tablet (20 mg total) by mouth daily.  Dispense: 90 tablet; Refill: 3  4. Screening for diabetes mellitus - Hemoglobin A1c  5. Encounter for screening mammogram for breast cancer Schedule at [facility] - MM 3D SCREEN BREAST BILATERAL  6. Need for  immunization against influenza - Flu Vaccine QUAD High Dose(Fluad)   Partially dictated using Editor, commissioning. Any errors are unintentional.  Halina Maidens, MD [facility]  06/24/2022

## 2022-06-25 LAB — CBC WITH DIFFERENTIAL/PLATELET
Basophils Absolute: 0.1 10*3/uL (ref 0.0–0.2)
Basos: 0 %
EOS (ABSOLUTE): 0.1 10*3/uL (ref 0.0–0.4)
Eos: 1 %
Hematocrit: 41.7 % (ref 34.0–46.6)
Hemoglobin: 13.5 g/dL (ref 11.1–15.9)
Immature Grans (Abs): 0.1 10*3/uL (ref 0.0–0.1)
Immature Granulocytes: 1 %
Lymphocytes Absolute: 1 10*3/uL (ref 0.7–3.1)
Lymphs: 8 %
MCH: 28.7 pg (ref 26.6–33.0)
MCHC: 32.4 g/dL (ref 31.5–35.7)
MCV: 89 fL (ref 79–97)
Monocytes Absolute: 0.7 10*3/uL (ref 0.1–0.9)
Monocytes: 5 %
Neutrophils Absolute: 11.4 10*3/uL — ABNORMAL HIGH (ref 1.4–7.0)
Neutrophils: 85 %
Platelets: 332 10*3/uL (ref 150–450)
RBC: 4.71 x10E6/uL (ref 3.77–5.28)
RDW: 12.9 % (ref 11.7–15.4)
WBC: 13.3 10*3/uL — ABNORMAL HIGH (ref 3.4–10.8)

## 2022-06-25 LAB — COMPREHENSIVE METABOLIC PANEL
ALT: 15 IU/L (ref 0–32)
AST: 19 IU/L (ref 0–40)
Albumin/Globulin Ratio: 1.3 (ref 1.2–2.2)
Albumin: 3.9 g/dL (ref 3.9–4.9)
Alkaline Phosphatase: 78 IU/L (ref 44–121)
BUN/Creatinine Ratio: 18 (ref 12–28)
BUN: 26 mg/dL (ref 8–27)
Bilirubin Total: <0.2 mg/dL (ref 0.0–1.2)
CO2: 23 mmol/L (ref 20–29)
Calcium: 9.5 mg/dL (ref 8.7–10.3)
Chloride: 103 mmol/L (ref 96–106)
Creatinine, Ser: 1.47 mg/dL — ABNORMAL HIGH (ref 0.57–1.00)
Globulin, Total: 3.1 g/dL (ref 1.5–4.5)
Glucose: 152 mg/dL — ABNORMAL HIGH (ref 70–99)
Potassium: 4.5 mmol/L (ref 3.5–5.2)
Sodium: 147 mmol/L — ABNORMAL HIGH (ref 134–144)
Total Protein: 7 g/dL (ref 6.0–8.5)
eGFR: 39 mL/min/{1.73_m2} — ABNORMAL LOW (ref >59)

## 2022-06-25 LAB — TSH+FREE T4
Free T4: 1.38 ng/dL (ref 0.82–1.77)
TSH: 0.127 u[IU]/mL — ABNORMAL LOW (ref 0.450–4.500)

## 2022-06-25 LAB — HEMOGLOBIN A1C
Est. average glucose Bld gHb Est-mCnc: 128 mg/dL
Hgb A1c MFr Bld: 6.1 % — ABNORMAL HIGH (ref 4.8–5.6)

## 2022-07-06 DIAGNOSIS — G4733 Obstructive sleep apnea (adult) (pediatric): Secondary | ICD-10-CM | POA: Diagnosis not present

## 2022-07-12 ENCOUNTER — Encounter: Payer: BC Managed Care – PPO | Attending: Pulmonary Disease | Admitting: *Deleted

## 2022-07-12 DIAGNOSIS — D860 Sarcoidosis of lung: Secondary | ICD-10-CM

## 2022-07-12 NOTE — Progress Notes (Signed)
Initial telephone orientation completed. Diagnosis can be found in [facility] 8/16. EP orientation scheduled for Wednesday 10/4 at 2:30.

## 2022-07-19 DIAGNOSIS — I10 Essential (primary) hypertension: Secondary | ICD-10-CM | POA: Diagnosis not present

## 2022-07-19 DIAGNOSIS — G4733 Obstructive sleep apnea (adult) (pediatric): Secondary | ICD-10-CM | POA: Diagnosis not present

## 2022-07-20 ENCOUNTER — Encounter: Payer: BC Managed Care – PPO | Attending: Pulmonary Disease

## 2022-07-20 DIAGNOSIS — I272 Pulmonary hypertension, unspecified: Secondary | ICD-10-CM | POA: Insufficient documentation

## 2022-07-20 DIAGNOSIS — D860 Sarcoidosis of lung: Secondary | ICD-10-CM | POA: Insufficient documentation

## 2022-07-20 DIAGNOSIS — G4730 Sleep apnea, unspecified: Secondary | ICD-10-CM | POA: Insufficient documentation

## 2022-07-20 DIAGNOSIS — J479 Bronchiectasis, uncomplicated: Secondary | ICD-10-CM | POA: Insufficient documentation

## 2022-07-20 NOTE — Progress Notes (Signed)
Incomplete Session Note  Patient Details  Name: Stacie [name] MRN: [id number] Date of Birth: 07/27/1956 Referring Provider:   Flowsheet Row Pulmonary Rehab from 04/03/2018 in [facility]  Referring Provider [place]       [name] did not complete her rehab orientation. Patient wore open-toed shoes and therefore was not able to complete her 6MWT. We will complete her 6MWT on Monday, 10/9. Patient will also bring her paperwork with her as well. All other interview questions answered and program was explained. Resting vitals obtained and WNL.

## 2022-07-21 DIAGNOSIS — D869 Sarcoidosis, unspecified: Secondary | ICD-10-CM | POA: Diagnosis not present

## 2022-07-21 DIAGNOSIS — N184 Chronic kidney disease, stage 4 (severe): Secondary | ICD-10-CM | POA: Diagnosis not present

## 2022-07-21 DIAGNOSIS — M810 Age-related osteoporosis without current pathological fracture: Secondary | ICD-10-CM | POA: Diagnosis not present

## 2022-07-23 DIAGNOSIS — G4733 Obstructive sleep apnea (adult) (pediatric): Secondary | ICD-10-CM | POA: Diagnosis not present

## 2022-07-23 DIAGNOSIS — I10 Essential (primary) hypertension: Secondary | ICD-10-CM | POA: Diagnosis not present

## 2022-07-25 VITALS — Ht 66.0 in | Wt 188.6 lb

## 2022-07-25 DIAGNOSIS — D860 Sarcoidosis of lung: Secondary | ICD-10-CM

## 2022-07-25 DIAGNOSIS — I272 Pulmonary hypertension, unspecified: Secondary | ICD-10-CM | POA: Diagnosis not present

## 2022-07-25 DIAGNOSIS — J479 Bronchiectasis, uncomplicated: Secondary | ICD-10-CM | POA: Diagnosis not present

## 2022-07-25 DIAGNOSIS — G4730 Sleep apnea, unspecified: Secondary | ICD-10-CM | POA: Diagnosis not present

## 2022-07-25 NOTE — Progress Notes (Signed)
Pulmonary Individual Treatment Plan  Patient Details  Name: Stacie [name] MRN: [id number] Date of Birth: 1956/06/13 Referring Provider:   Flowsheet Row Pulmonary Rehab from 07/25/2022 in [facility] Cardiac and Pulmonary Rehab  Referring Provider Ottie Glazier, MD       Initial Encounter Date:  Flowsheet Row Pulmonary Rehab from 07/25/2022 in [facility] Cardiac and Pulmonary Rehab  Date 07/25/22       Visit Diagnosis: Sarcoidosis of lung ([place])  Patient's Home Medications on Admission:  Current Outpatient Medications:    acetaminophen (TYLENOL) 500 MG tablet, Take 500 mg by mouth every 6 (six) hours as needed for moderate pain or headache., Disp: , Rfl:    denosumab (PROLIA) 60 MG/ML SOSY injection, Inject 60 mg into the skin every 6 (six) months., Disp: , Rfl:    dorzolamide (TRUSOPT) 2 % ophthalmic solution, Place 1 drop into both eyes 2 (two) times daily., Disp: , Rfl:    escitalopram (LEXAPRO) 20 MG tablet, Take 1 tablet (20 mg total) by mouth daily., Disp: 90 tablet, Rfl: 3   Fluticasone-Umeclidin-Vilant (TRELEGY ELLIPTA) 200-62.5-25 MCG/ACT AEPB, Inhale into the lungs., Disp: , Rfl:    latanoprost (XALATAN) 0.005 % ophthalmic solution, Place 1 drop into the right eye 2 (two) times daily., Disp: , Rfl:    levothyroxine (SYNTHROID) 137 MCG tablet, Take 1 tablet (137 mcg total) by mouth daily before breakfast., Disp: 90 tablet, Rfl: 3   Menthol (ICY HOT) 5 % PTCH, Apply 1 patch topically daily as needed (pain)., Disp: , Rfl:    Multiple Vitamins-Minerals (MULTIVITAMIN WITH MINERALS) tablet, Take 1 tablet by mouth daily., Disp: , Rfl:    naproxen sodium (ALEVE) 220 MG tablet, Take 220 mg by mouth daily as needed (pain)., Disp: , Rfl:    NON FORMULARY, CPAP nightly, Disp: , Rfl:    OXYGEN, Inhale 2 L into the lungs daily as needed (During walking and activity). , Disp: , Rfl:    predniSONE (DELTASONE) 10 MG tablet, Take 2 tablets (20 mg total) by mouth daily. Resume only after the finishing  new Rx for prednisone, Disp: , Rfl:    Probiotic Product (PROBIOTIC PO), Take 1 capsule by mouth daily., Disp: , Rfl:    Treprostinil (TYVASO) 0.6 MG/ML SOLN, Inhale into the lungs See admin instructions. 12 breaths 4 times a day, Disp: , Rfl:   Past Medical History: Past Medical History:  Diagnosis Date   Acute renal failure ([place])    Acute respiratory failure ([place]) 10/23/2021   Acute respiratory failure with hypoxia (HCC)    ARF (acute respiratory failure) ([place]) 10/19/2021   Arrhythmia    patient unaware if this is current   Asthma    Chronic kidney disease    Critical lower limb ischemia (HCC)    Depression    GERD (gastroesophageal reflux disease)    Heart murmur    History of kidney stones    HOH (hard of hearing)    wear aids   Hyperthyroidism    Hypothyroidism    IBS (irritable bowel syndrome)    Pneumonia    Pulmonary hypertension ([place])    Sarcoid    Sarcoidosis    Seasonal allergies    Sleep apnea CPAP with O2   Stroke ([place]) 07/2020   watershed   Wears hearing aid in both ears     Tobacco Use: Social History   Tobacco Use  Smoking Status Never  Smokeless Tobacco Never    Labs: Review Flowsheet  More data exists  Latest Ref Rng & Units 07/16/2020 07/21/2020 01/14/2021 10/19/2021 06/24/2022  Labs for ITP Cardiac and Pulmonary Rehab  Cholestrol 0 - 200 mg/dL - 289  - - -  LDL (calc) 0 - 99 mg/dL - 198  - - -  HDL-C >40 mg/dL - 33  - - -  Trlycerides <150 mg/dL - 290  - - -  Hemoglobin A1c 4.8 - 5.6 % 6.4  - - - 6.1   PH, Arterial 7.350 - 7.450 7.41  - - - -  PCO2 arterial 32.0 - 48.0 mmHg 52  - - - -  Bicarbonate 20.0 - 28.0 mmol/L 33.0  - - 31.7  -  TCO2 22 - 32 mmol/L - - 30  - -  O2 Saturation % 95.2  - - 33.6  -     Pulmonary Assessment Scores:  Pulmonary Assessment Scores     Row Name 07/25/22 1331         mMRC Score   mMRC Score 4              UCSD: Self-administered rating of dyspnea associated with activities of daily living  (ADLs) 6-point scale (0 = "not at all" to 5 = "maximal or unable to do because of breathlessness")  Scoring Scores range from 0 to 120.  Minimally important difference is 5 units  CAT: CAT can identify the health impairment of COPD patients and is better correlated with disease progression.  CAT has a scoring range of zero to 40. The CAT score is classified into four groups of low (less than 10), medium (10 - 20), high (21-30) and very high (31-40) based on the impact level of disease on health status. A CAT score over 10 suggests significant symptoms.  A worsening CAT score could be explained by an exacerbation, poor medication adherence, poor inhaler technique, or progression of COPD or comorbid conditions.  CAT MCID is 2 points  mMRC: mMRC (Modified Medical Research Council) Dyspnea Scale is used to assess the degree of baseline functional disability in patients of respiratory disease due to dyspnea. No minimal important difference is established. A decrease in score of 1 point or greater is considered a positive change.   Pulmonary Function Assessment:   Exercise Target Goals: Exercise Program Goal: Individual exercise prescription set using results from initial 6 min walk test and THRR while considering  patient's activity barriers and safety.   Exercise Prescription Goal: Initial exercise prescription builds to 30-45 minutes a day of aerobic activity, 2-3 days per week.  Home exercise guidelines will be given to patient during program as part of exercise prescription that the participant will acknowledge.  Education: Aerobic Exercise: - Group verbal and visual presentation on the components of exercise prescription. Introduces F.I.T.T principle from ACSM for exercise prescriptions.  Reviews F.I.T.T. principles of aerobic exercise including progression. Written material given at graduation. Flowsheet Row Pulmonary Rehab from 08/10/2018 in [facility] Cardiac and Pulmonary Rehab  Date 05/02/18   Educator [facility]  Instruction Review Code 1- Verbalizes Understanding       Education: Resistance Exercise: - Group verbal and visual presentation on the components of exercise prescription. Introduces F.I.T.T principle from ACSM for exercise prescriptions  Reviews F.I.T.T. principles of resistance exercise including progression. Written material given at graduation.    Education: Exercise & Equipment Safety: - Individual verbal instruction and demonstration of equipment use and safety with use of the equipment. Flowsheet Row Pulmonary Rehab from 03/24/2020 in [facility] Cardiac and Pulmonary Rehab  Date 03/24/20  Educator jh  Instruction Review Code 1- Verbalizes Understanding       Education: Exercise Physiology & General Exercise Guidelines: - Group verbal and written instruction with models to review the exercise physiology of the cardiovascular system and associated critical values. Provides general exercise guidelines with specific guidelines to those with heart or lung disease.  Flowsheet Row Pulmonary Rehab from 08/10/2018 in [facility] Cardiac and Pulmonary Rehab  Date 07/11/18  Educator [facility]  Instruction Review Code 1- Verbalizes Understanding       Education: Flexibility, Balance, Mind/Body Relaxation: - Group verbal and visual presentation with interactive activity on the components of exercise prescription. Introduces F.I.T.T principle from ACSM for exercise prescriptions. Reviews F.I.T.T. principles of flexibility and balance exercise training including progression. Also discusses the mind body connection.  Reviews various relaxation techniques to help reduce and manage stress (i.e. Deep breathing, progressive muscle relaxation, and visualization). Balance handout provided to take home. Written material given at graduation. Flowsheet Row Pulmonary Rehab from 08/10/2018 in [facility] Cardiac and Pulmonary Rehab  Date 08/08/18  Educator [facility]  Instruction Review Code 1- Verbalizes Understanding        Activity Barriers & Risk Stratification:  Activity Barriers & Cardiac Risk Stratification - 07/25/22 1310       Activity Barriers & Cardiac Risk Stratification   Activity Barriers Other (comment);Shortness of Breath    Comments stroke 2021- left foot 3 toes amputated; broke elbow after tripping over a cord ("healed")             6 Minute Walk:  6 Minute Walk     Row Name 07/25/22 1306         6 Minute Walk   Phase Initial     Distance 705 feet     Walk Time 5.83 minutes     # of Rest Breaks 1     MPH 1.37     METS 2.25     RPE 15     Perceived Dyspnea  4     VO2 Peak 7.88     Symptoms Yes (comment)     Comments SOB     Resting HR 116 bpm     Resting BP 124/66     Resting Oxygen Saturation  91 %     Exercise Oxygen Saturation  during 6 min walk 84 %     Max Ex. HR 133 bpm     Max Ex. BP 140/80     2 Minute Post BP 128/72       Interval HR   1 Minute HR 116     2 Minute HR 127     3 Minute HR 129     4 Minute HR 127     5 Minute HR 133     6 Minute HR 128     2 Minute Post HR 119     Interval Heart Rate? Yes       Interval Oxygen   Interval Oxygen? Yes     Baseline Oxygen Saturation % 91 %     1 Minute Oxygen Saturation % 91 %     1 Minute Liters of Oxygen 5 L  Pulsed     2 Minute Oxygen Saturation % 90 %     2 Minute Liters of Oxygen 5 L  Pulsed     3 Minute Oxygen Saturation % 84 %     3 Minute Liters of Oxygen 5 L  Pulsed     4 Minute Oxygen  Saturation % 84 %     4 Minute Liters of Oxygen 5 L  Pulsed     5 Minute Oxygen Saturation % 86 %     5 Minute Liters of Oxygen 5 L  Pulsed     6 Minute Oxygen Saturation % 85 %     6 Minute Liters of Oxygen 5 L  Pulsed     2 Minute Post Oxygen Saturation % 90 %     2 Minute Post Liters of Oxygen 5 L  Pulsed             Oxygen Initial Assessment:  Oxygen Initial Assessment - 07/12/22 1334       Home Oxygen   Home Oxygen Device Portable Concentrator;Home Concentrator    Sleep Oxygen  Prescription CPAP    Liters per minute 2    Home Exercise Oxygen Prescription Continuous    Liters per minute 2    Home Resting Oxygen Prescription Continuous   Doctor told her she could use prn during rest   Liters per minute 2    Compliance with Home Oxygen Use Yes      Intervention   Short Term Goals To learn and exhibit compliance with exercise, home and travel O2 prescription;To learn and understand importance of monitoring SPO2 with pulse oximeter and demonstrate accurate use of the pulse oximeter.;To learn and understand importance of maintaining oxygen saturations>88%;To learn and demonstrate proper pursed lip breathing techniques or other breathing techniques. ;To learn and demonstrate proper use of respiratory medications    Long  Term Goals Exhibits compliance with exercise, home  and travel O2 prescription;Verbalizes importance of monitoring SPO2 with pulse oximeter and return demonstration;Maintenance of O2 saturations>88%;Exhibits proper breathing techniques, such as pursed lip breathing or other method taught during program session;Compliance with respiratory medication;Demonstrates proper use of MDI's             Oxygen Re-Evaluation:  Oxygen Re-Evaluation     Row Name 07/25/22 1015             Goals/Expected Outcomes   Comments Reviewed PLB technique with pt.  Talked about how it works and it's importance in maintaining their exercise saturations.       Goals/Expected Outcomes Short: Become more profiecient at using PLB.   Long: Become independent at using PLB.                Oxygen Discharge (Final Oxygen Re-Evaluation):  Oxygen Re-Evaluation - 07/25/22 1015       Goals/Expected Outcomes   Comments Reviewed PLB technique with pt.  Talked about how it works and it's importance in maintaining their exercise saturations.    Goals/Expected Outcomes Short: Become more profiecient at using PLB.   Long: Become independent at using PLB.             Initial  Exercise Prescription:  Initial Exercise Prescription - 07/25/22 1300       Date of Initial Exercise RX and Referring Provider   Date 07/25/22    Referring Provider Ottie Glazier, MD      Oxygen   Oxygen Continuous    Liters 2    Maintain Oxygen Saturation 88% or higher      Treadmill   MPH 1.5    Grade 0.5    Minutes 15    METs 2.26      REL-XR   Level 1    Minutes 15    METs 2.25      T5 Nustep  Level 1    SPM 80    Minutes 15    METs 2.25      Prescription Details   Frequency (times per week) 3    Duration Progress to 30 minutes of continuous aerobic without signs/symptoms of physical distress      Intensity   THRR 40-80% of Max Heartrate 130-145    Ratings of Perceived Exertion 11-13    Perceived Dyspnea 0-4      Progression   Progression Continue to progress workloads to maintain intensity without signs/symptoms of physical distress.      Resistance Training   Training Prescription Yes    Weight 3 lb    Reps 10-15             Perform Capillary Blood Glucose checks as needed.  Exercise Prescription Changes:   Exercise Prescription Changes     Row Name 07/25/22 1300             Response to Exercise   Blood Pressure (Admit) 124/66       Blood Pressure (Exercise) 140/80       Blood Pressure (Exit) 128/72       Heart Rate (Admit) 116 bpm       Heart Rate (Exercise) 133 bpm       Heart Rate (Exit) 119 bpm       Oxygen Saturation (Admit) 91 %       Oxygen Saturation (Exercise) 84 %       Oxygen Saturation (Exit) 90 %       Rating of Perceived Exertion (Exercise) 14       Perceived Dyspnea (Exercise) 4       Symptoms SOB       Comments 6MWT Results                Exercise Comments:   Exercise Comments     Row Name 07/25/22 1012           Exercise Comments First full day of exercise!  Patient was oriented to gym and equipment including functions, settings, policies, and procedures.  Patient's individual exercise prescription  and treatment plan were reviewed.  All starting workloads were established based on the results of the 6 minute walk test done at initial orientation visit.  The plan for exercise progression was also introduced and progression will be customized based on patient's performance and goals.                Exercise Goals and Review:   Exercise Goals     Row Name 07/25/22 1330             Exercise Goals   Increase Physical Activity Yes       Intervention Provide advice, education, support and counseling about physical activity/exercise needs.;Develop an individualized exercise prescription for aerobic and resistive training based on initial evaluation findings, risk stratification, comorbidities and participant's personal goals.       Expected Outcomes Short Term: Attend rehab on a regular basis to increase amount of physical activity.;Long Term: Add in home exercise to make exercise part of routine and to increase amount of physical activity.;Long Term: Exercising regularly at least 3-5 days a week.       Increase Strength and Stamina Yes       Intervention Provide advice, education, support and counseling about physical activity/exercise needs.;Develop an individualized exercise prescription for aerobic and resistive training based on initial evaluation findings, risk stratification, comorbidities and participant's personal goals.  Expected Outcomes Short Term: Increase workloads from initial exercise prescription for resistance, speed, and METs.;Short Term: Perform resistance training exercises routinely during rehab and add in resistance training at home;Long Term: Improve cardiorespiratory fitness, muscular endurance and strength as measured by increased METs and functional capacity (6MWT)       Able to understand and use rate of perceived exertion (RPE) scale Yes       Intervention Provide education and explanation on how to use RPE scale       Expected Outcomes Short Term: Able to  use RPE daily in rehab to express subjective intensity level;Long Term:  Able to use RPE to guide intensity level when exercising independently       Able to understand and use Dyspnea scale Yes       Intervention Provide education and explanation on how to use Dyspnea scale       Expected Outcomes Short Term: Able to use Dyspnea scale daily in rehab to express subjective sense of shortness of breath during exertion;Long Term: Able to use Dyspnea scale to guide intensity level when exercising independently       Knowledge and understanding of Target Heart Rate Range (THRR) Yes       Intervention Provide education and explanation of THRR including how the numbers were predicted and where they are located for reference       Expected Outcomes Short Term: Able to state/look up THRR;Short Term: Able to use daily as guideline for intensity in rehab;Long Term: Able to use THRR to govern intensity when exercising independently       Able to check pulse independently Yes       Intervention Provide education and demonstration on how to check pulse in carotid and radial arteries.;Review the importance of being able to check your own pulse for safety during independent exercise       Expected Outcomes Short Term: Able to explain why pulse checking is important during independent exercise;Long Term: Able to check pulse independently and accurately       Understanding of Exercise Prescription Yes       Intervention Provide education, explanation, and written materials on patient's individual exercise prescription       Expected Outcomes Short Term: Able to explain program exercise prescription;Long Term: Able to explain home exercise prescription to exercise independently                Exercise Goals Re-Evaluation :  Exercise Goals Re-Evaluation     Row Name 07/25/22 1012             Exercise Goal Re-Evaluation   Exercise Goals Review Able to understand and use rate of perceived exertion (RPE)  scale;Able to understand and use Dyspnea scale;Knowledge and understanding of Target Heart Rate Range (THRR);Understanding of Exercise Prescription       Comments Reviewed RPE scale, THR and program prescription with pt today.  Pt voiced understanding and was given a copy of goals to take home.       Expected Outcomes Short: Use RPE daily to regulate intensity.  Long: Follow program prescription in THR.                Discharge Exercise Prescription (Final Exercise Prescription Changes):  Exercise Prescription Changes - 07/25/22 1300       Response to Exercise   Blood Pressure (Admit) 124/66    Blood Pressure (Exercise) 140/80    Blood Pressure (Exit) 128/72    Heart Rate (Admit) 116 bpm  Heart Rate (Exercise) 133 bpm    Heart Rate (Exit) 119 bpm    Oxygen Saturation (Admit) 91 %    Oxygen Saturation (Exercise) 84 %    Oxygen Saturation (Exit) 90 %    Rating of Perceived Exertion (Exercise) 14    Perceived Dyspnea (Exercise) 4    Symptoms SOB    Comments 6MWT Results             Nutrition:  Target Goals: Understanding of nutrition guidelines, daily intake of sodium <1597m, cholesterol <202m calories 30% from fat and 7% or less from saturated fats, daily to have 5 or more servings of fruits and vegetables.  Education: All About Nutrition: -Group instruction provided by verbal, written material, interactive activities, discussions, models, and posters to present general guidelines for heart healthy nutrition including fat, fiber, MyPlate, the role of sodium in heart healthy nutrition, utilization of the nutrition label, and utilization of this knowledge for meal planning. Follow up email sent as well. Written material given at graduation. Flowsheet [facility]  Date 06/27/18  Educator [facility]  Instruction Review Code 1- Verbalizes Understanding       Biometrics:  Pre Biometrics - 07/25/22 1330       Pre  Biometrics   Height _0  (1.676 m)    Weight 188 lb 9.6 oz (85.5 kg)    Waist Circumference 37 inches    Hip Circumference 44 inches    Waist to Hip Ratio 0.84 %    BMI (Calculated) 30.46              Nutrition Therapy Plan and Nutrition Goals:  Nutrition Therapy & Goals - 07/20/22 1517       Nutrition Therapy   Diet Heart healthy, low Na, pulmonary MNT    Protein (specify units) 90-95g    Fiber 27 grams    Whole Grain Foods 3 servings    Saturated Fats 15 max. grams    Fruits and Vegetables 8 servings/day    Sodium 1.5 grams      Personal Nutrition Goals   Nutrition Goal ST: practice MyPlate guidelines (adding in 1-2 additional servings of non-starchy vegetables), be sure to include at least 1 good source of protein at meals and snacks, limit sodium by reading food labels and using other ways to flavor food LT: limit Na <1.5g/day, have at least 5 servings of vegetables/day    Comments [age].o. F admitted to pulmonary rehab for sarcoidosis of lung. PMHx includes hypothyroidism, HLD, CKD stg 3, asthma, HTN, OSA, elevated BG (A1C 6.4), anxiety. Relevant medications includes lexapro, synthroid, MVI, prednisone, probiotic, tyvaso, trilogy. B: healthy waffles occasionally. She will have eggs with sausage. S: cheese and crackers or yogurt (greek yogurt) L: egg salad or tuna salad with lettuce, rye bread. S: cheese and crackers or yogurt (greek yogurt) D: protein (meat or chicken) with a salad and more starchy vegetables. S: sometimes low calorie ice cream bar. She loves fruit and will have it with most meals. She limits dairy as this can make her not feel great. She will use avocado oil and coconut aminos mostly, she does also use "no salt" which replaces sodium chloride with potassium choride. Drinks: Coffee (sometimes), water. She reports that her daughter is health conscious and has taken care of her for about 2 years - she is now going back to work. Discussed general heart healthy eating,  sodium, and pulmonary MNT. Also reviewed ways to  maintain a healthy BG. Advised to stop using the "no salt" and to decrease the salt in her processed foods as well as adding other ways to flavor her food. Since her protein needs are higher, suggested she have at least one good source of protein at each meal.      Intervention Plan   Intervention Prescribe, educate and counsel regarding individualized specific dietary modifications aiming towards targeted core components such as weight, hypertension, lipid management, diabetes, heart failure and other comorbidities.    Expected Outcomes Short Term Goal: Understand basic principles of dietary content, such as calories, fat, sodium, cholesterol and nutrients.;Short Term Goal: A plan has been developed with personal nutrition goals set during dietitian appointment.;Long Term Goal: Adherence to prescribed nutrition plan.             Nutrition Assessments:  MEDIFICTS Score Key: ?70 Need to make dietary changes  40-70 Heart Healthy Diet ? 40 Therapeutic Level Cholesterol Diet   Picture Your Plate Scores: <05 Unhealthy dietary pattern with much room for improvement. 41-50 Dietary pattern unlikely to meet recommendations for good health and room for improvement. 51-60 More healthful dietary pattern, with some room for improvement.  >60 Healthy dietary pattern, although there may be some specific behaviors that could be improved.   Nutrition Goals Re-Evaluation:   Nutrition Goals Discharge (Final Nutrition Goals Re-Evaluation):   Psychosocial: Target Goals: Acknowledge presence or absence of significant depression and/or stress, maximize coping skills, provide positive support system. Participant is able to verbalize types and ability to use techniques and skills needed for reducing stress and depression.   Education: Stress, Anxiety, and Depression - Group verbal and visual presentation to define topics covered.  Reviews how body is  impacted by stress, anxiety, and depression.  Also discusses healthy ways to reduce stress and to treat/manage anxiety and depression.  Written material given at graduation. Flowsheet Row Pulmonary Rehab from 08/10/2018 in [facility] Cardiac and Pulmonary Rehab  Date 08/01/18  Educator [facility]  Instruction Review Code 1- [employer] Understanding       Education: Sleep Hygiene -Provides group verbal and written instruction about how sleep can affect your health.  Define sleep hygiene, discuss sleep cycles and impact of sleep habits. Review good sleep hygiene tips.  Flowsheet Row Pulmonary Rehab from 08/10/2018 in [facility] Cardiac and Pulmonary Rehab  Date 04/11/18  Educator [facility]  Instruction Review Code 1- Verbalizes Understanding       Initial Review & Psychosocial Screening:  Initial Psych Review & Screening - 07/12/22 1339       Initial Review   Current issues with Current Stress Concerns    Source of Stress Concerns Chronic Illness      Family Dynamics   Good Support System? Yes   family and friends     Barriers   Psychosocial barriers to participate in program There are no identifiable barriers or psychosocial needs.;The patient should benefit from training in stress management and relaxation.      Screening Interventions   Interventions Encouraged to exercise;Provide feedback about the scores to participant;To provide support and resources with identified psychosocial needs    Expected Outcomes Short Term goal: Utilizing psychosocial counselor, staff and physician to assist with identification of specific Stressors or current issues interfering with healing process. Setting desired goal for each stressor or current issue identified.;Long Term Goal: Stressors or current issues are controlled or eliminated.;Short Term goal: Identification and review with participant of any Quality of Life or Depression concerns found by scoring the  questionnaire.;Long Term goal: The participant improves quality  of Life and PHQ9 Scores as seen by post scores and/or verbalization of changes             Quality of Life Scores:  Scores of 19 and below usually indicate a poorer quality of life in these areas.  A difference of  2-3 points is a clinically meaningful difference.  A difference of 2-3 points in the total score of the Quality of Life Index has been associated with significant improvement in overall quality of life, self-image, physical symptoms, and general health in studies assessing change in quality of life.  PHQ-9: Review Flowsheet  More data exists      07/20/2022 06/24/2022 10/13/2021 09/28/2021 09/21/2020  Depression screen PHQ 2/9  Decreased Interest 0 0 0 0 0  Down, Depressed, Hopeless 0 0 0 0 0  PHQ - 2 Score 0 0 0 0 0  Altered sleeping 1 0 0 0 1  Tired, decreased energy 1 0 0 0 1  Change in appetite 0 0 0 0 0  Feeling bad or failure about yourself  0 0 0 0 0  Trouble concentrating 1 0 0 0 0  Moving slowly or fidgety/restless 0 0 0 0 0  Suicidal thoughts 0 0 0 0 0  PHQ-9 Score 3 0 0 0 2  Difficult doing work/chores Not difficult at all Not difficult at all Not difficult at all Not difficult at all Not difficult at all   Interpretation of Total Score  Total Score Depression Severity:  1-4 = Minimal depression, 5-9 = Mild depression, 10-14 = Moderate depression, 15-19 = Moderately severe depression, 20-27 = Severe depression   Psychosocial Evaluation and Intervention:  Psychosocial Evaluation - 07/12/22 1352       Psychosocial Evaluation & Interventions   Interventions Encouraged to exercise with the program and follow exercise prescription;Relaxation education    Comments Tye [place] is returning to pulmonary rehab by request. She had completed the program in 2019 and after having a stroke and increased breathing issues, she asked to come back. She wants to be able to play with her 11 and soon to be 12 grandkids, and states she knows her breathing may not get better but she  wants to feel better.  Her strength and stamina are also things she wants to work on. She does not report any sleep concerns. She reports that sometimes she may feel down about all the medical things that have happened recently, but she doesn't stay there long because of her amazing support system.    Expected Outcomes SHort: attend pulmonary rehab for educaiton and exercise. Long: develop and maintain positive self care habits.    Continue Psychosocial Services  Follow up required by staff             Psychosocial Re-Evaluation:   Psychosocial Discharge (Final Psychosocial Re-Evaluation):   Education: Education Goals: Education classes will be provided on a weekly basis, covering required topics. Participant will state understanding/return demonstration of topics presented.  Learning Barriers/Preferences:   General Pulmonary Education Topics:  Infection Prevention: - Provides verbal and written material to individual with discussion of infection control including proper hand washing and proper equipment cleaning during exercise session. Flowsheet Row Pulmonary Rehab from 08/10/2018 in [facility] Cardiac and Pulmonary Rehab  Date 04/03/18  Educator [facility]  Instruction Review Code 1- Verbalizes Understanding       Falls Prevention: - Provides verbal and written material to individual with discussion of falls prevention and  safety. Flowsheet Row Pulmonary Rehab from 03/24/2020 in [facility] Cardiac and Pulmonary Rehab  Date 03/24/20  Educator [facility]  Instruction Review Code 1- Verbalizes Understanding       Chronic Lung Disease Review: - Group verbal instruction with posters, models, PowerPoint presentations and videos,  to review new updates, new respiratory medications, new advancements in procedures and treatments. Providing information on websites and "800" numbers for continued self-education. Includes information about supplement oxygen, available portable oxygen systems, continuous and  intermittent flow rates, oxygen safety, concentrators, and Medicare reimbursement for oxygen. Explanation of Pulmonary Drugs, including class, frequency, complications, importance of spacers, rinsing mouth after steroid MDI's, and proper cleaning methods for nebulizers. Review of basic lung anatomy and physiology related to function, structure, and complications of lung disease. Review of risk factors. Discussion about methods for diagnosing sleep apnea and types of masks and machines for OSA. Includes a review of the use of types of environmental controls: home humidity, furnaces, filters, dust mite/pet prevention, HEPA vacuums. Discussion about weather changes, air quality and the benefits of nasal washing. Instruction on Warning signs, infection symptoms, calling MD promptly, preventive modes, and value of vaccinations. Review of effective airway clearance, coughing and/or vibration techniques. Emphasizing that all should Create an Action Plan. Written material given at graduation. Flowsheet Row Pulmonary Rehab from 08/10/2018 in [facility] Cardiac and Pulmonary Rehab  Date 06/29/18  Educator [facility]  Instruction Review Code 1- Verbalizes Understanding       AED/CPR: - Group verbal and written instruction with the use of models to demonstrate the basic use of the AED with the basic ABC's of resuscitation.    Anatomy and Cardiac Procedures: - Group verbal and visual presentation and models provide information about basic cardiac anatomy and function. Reviews the testing methods done to diagnose heart disease and the outcomes of the test results. Describes the treatment choices: Medical Management, Angioplasty, or Coronary Bypass Surgery for treating various heart conditions including Myocardial Infarction, Angina, Valve Disease, and Cardiac Arrhythmias.  Written material given at graduation. Flowsheet Row Pulmonary Rehab from 08/10/2018 in [facility] Cardiac and Pulmonary Rehab  Date 05/16/18  Educator [facility]   Instruction Review Code 5- Refused Teaching       Medication Safety: - Group verbal and visual instruction to review commonly prescribed medications for heart and lung disease. Reviews the medication, class of the drug, and side effects. Includes the steps to properly store meds and maintain the prescription regimen.  Written material given at graduation. Flowsheet Row Pulmonary Rehab from 08/10/2018 in [facility] Cardiac and Pulmonary Rehab  Date 08/10/18  Educator [facility]  Instruction Review Code 1- Verbalizes Understanding       Other: -Provides group and verbal instruction on various topics (see comments)   Knowledge Questionnaire Score:    Core Components/Risk Factors/Patient Goals at Admission:  Personal Goals and Risk Factors at Admission - 07/12/22 1337       Core Components/Risk Factors/Patient Goals on Admission    Weight Management --    Intervention --    Expected Outcomes --    Improve shortness of breath with ADL's Yes    Intervention Provide education, individualized exercise plan and daily activity instruction to help decrease symptoms of SOB with activities of daily living.    Expected Outcomes Short Term: Improve cardiorespiratory fitness to achieve a reduction of symptoms when performing ADLs;Long Term: Be able to perform more ADLs without symptoms or delay the onset of symptoms    Hypertension Yes    Intervention Provide education  on lifestyle modifcations including regular physical activity/exercise, weight management, moderate sodium restriction and increased consumption of fresh fruit, vegetables, and low fat dairy, alcohol moderation, and smoking cessation.;Monitor prescription use compliance.    Expected Outcomes Short Term: Continued assessment and intervention until BP is < 140/19m HG in hypertensive participants. < 130/870mHG in hypertensive participants with diabetes, heart failure or chronic kidney disease.;Long Term: Maintenance of blood pressure at goal  levels.    Lipids Yes    Intervention Provide education and support for participant on nutrition & aerobic/resistive exercise along with prescribed medications to achieve LDL <7019mHDL >34m70m  Expected Outcomes Short Term: Participant states understanding of desired cholesterol values and is compliant with medications prescribed. Participant is following exercise prescription and nutrition guidelines.;Long Term: Cholesterol controlled with medications as prescribed, with individualized exercise RX and with personalized nutrition plan. Value goals: LDL < 70mg79mL > 40 mg.             Education:Diabetes - Individual verbal and written instruction to review signs/symptoms of diabetes, desired ranges of glucose level fasting, after meals and with exercise. Acknowledge that pre and post exercise glucose checks will be done for 3 sessions at entry of program.   Know Your Numbers and Heart Failure: - Group verbal and visual instruction to discuss disease risk factors for cardiac and pulmonary disease and treatment options.  Reviews associated critical values for Overweight/Obesity, Hypertension, Cholesterol, and Diabetes.  Discusses basics of heart failure: signs/symptoms and treatments.  Introduces Heart Failure Zone chart for action plan for heart failure.  Written material given at graduation.   Core Components/Risk Factors/Patient Goals Review:    Core Components/Risk Factors/Patient Goals at Discharge (Final Review):    ITP Comments:  ITP Comments     Row Name 07/12/22 1346 07/20/22 1546 07/25/22 1012       ITP Comments Initial telephone orientation completed. Diagnosis can be found in [facility] 8/16. EP orientation scheduled for Wednesday 10/4 at 2:30. Patient presented for orientation today, 10/4. Patient wore open-toed shoes and therefore could not complete 6MWT. Will do 6MWT on Monday, 10/9. First full day of exercise!  Patient was oriented to gym and equipment including functions,  settings, policies, and procedures.  Patient's individual exercise prescription and treatment plan were reviewed.  All starting workloads were established based on the results of the 6 minute walk test done at initial orientation visit.  The plan for exercise progression was also introduced and progression will be customized based on patient's performance and goals.              Comments: Initial ITP

## 2022-07-25 NOTE — Progress Notes (Signed)
Daily Session Note  Patient Details  Name: Stacie [name] MRN: [id number] Date of Birth: 07-Mar-1956 Referring Provider:   Flowsheet Row Pulmonary Rehab from 04/03/2018 in [facility] Cardiac and Pulmonary Rehab  Referring Provider [place]       Encounter Date: 07/25/2022  Check In:  Session Check In - 07/25/22 1011       Check-In   Staff Present Tommye Standard, BS, ACSM CEP, Exercise Physiologist;Noah Tickle, BS, Exercise Physiologist;Gricelda Foland Katrinka Blazing, RN, ADN    Virtual Visit No    Medication changes reported     No    Fall or balance concerns reported    No    Warm-up and Cool-down Performed on first and last piece of equipment    Resistance Training Performed Yes    VAD Patient? No    PAD/SET Patient? No      Pain Assessment   Currently in Pain? No/denies                Social History   Tobacco Use  Smoking Status Never  Smokeless Tobacco Never    Goals Met:  Independence with exercise equipment Exercise tolerated well No report of concerns or symptoms today Strength training completed today  Goals Unmet:  Not Applicable  Comments: First full day of exercise!  Patient was oriented to gym and equipment including functions, settings, policies, and procedures.  Patient's individual exercise prescription and treatment plan were reviewed.  All starting workloads were established based on the results of the 6 minute walk test done at initial orientation visit.  The plan for exercise progression was also introduced and progression will be customized based on patient's performance and goals.    6 Minute Walk     Row Name 07/25/22 1306         6 Minute Walk   Phase Initial     Distance 705 feet     Walk Time 5.83 minutes     # of Rest Breaks 1     MPH 1.37     METS 2.25     RPE 15     Perceived Dyspnea  4     VO2 Peak 7.88     Symptoms Yes (comment)     Comments SOB     Resting HR 116 bpm     Resting BP 124/66     Resting Oxygen Saturation  91 %     Exercise  Oxygen Saturation  during 6 min walk 84 %     Max Ex. HR 133 bpm     Max Ex. BP 140/80     2 Minute Post BP 128/72       Interval HR   1 Minute HR 116     2 Minute HR 127     3 Minute HR 129     4 Minute HR 127     5 Minute HR 133     6 Minute HR 128     2 Minute Post HR 119     Interval Heart Rate? Yes       Interval Oxygen   Interval Oxygen? Yes     Baseline Oxygen Saturation % 91 %     1 Minute Oxygen Saturation % 91 %     1 Minute Liters of Oxygen 5 L  Pulsed     2 Minute Oxygen Saturation % 90 %     2 Minute Liters of Oxygen 5 L  Pulsed     3 Minute  Oxygen Saturation % 84 %     3 Minute Liters of Oxygen 5 L  Pulsed     4 Minute Oxygen Saturation % 84 %     4 Minute Liters of Oxygen 5 L  Pulsed     5 Minute Oxygen Saturation % 86 %     5 Minute Liters of Oxygen 5 L  Pulsed     6 Minute Oxygen Saturation % 85 %     6 Minute Liters of Oxygen 5 L  Pulsed     2 Minute Post Oxygen Saturation % 90 %     2 Minute Post Liters of Oxygen 5 L  Pulsed              Dr. Bethann Punches is Medical Director for [facility] Cardiac Rehabilitation.  Dr. Vida Rigger is Medical Director for [facility] Pulmonary Rehabilitation.

## 2022-07-27 ENCOUNTER — Encounter: Payer: BC Managed Care – PPO | Admitting: *Deleted

## 2022-07-27 DIAGNOSIS — G4730 Sleep apnea, unspecified: Secondary | ICD-10-CM | POA: Diagnosis not present

## 2022-07-27 DIAGNOSIS — D860 Sarcoidosis of lung: Secondary | ICD-10-CM | POA: Diagnosis not present

## 2022-07-27 DIAGNOSIS — J479 Bronchiectasis, uncomplicated: Secondary | ICD-10-CM | POA: Diagnosis not present

## 2022-07-27 DIAGNOSIS — I272 Pulmonary hypertension, unspecified: Secondary | ICD-10-CM | POA: Diagnosis not present

## 2022-07-27 NOTE — Progress Notes (Signed)
Daily Session Note  Patient Details  Name: Stacie [name] MRN: [id number] Date of Birth: 02/25/1956 Referring Provider:   Flowsheet Row Pulmonary Rehab from 07/25/2022 in [facility] Cardiac and Pulmonary Rehab  Referring Provider Ottie Glazier, MD       Encounter Date: 07/27/2022  Check In:  Session Check In - 07/27/22 1127       Check-In   Supervising physician immediately available to respond to emergencies See telemetry face sheet for immediately available ER MD    Location [facility]-Cardiac & Pulmonary Rehab    Staff Present Antionette Fairy, BS, Exercise Physiologist;Joseph Rosebud Poles, RN, [place]    Virtual Visit No    Medication changes reported     No    Fall or balance concerns reported    No    Warm-up and Cool-down Performed on first and last piece of equipment    Resistance Training Performed Yes    VAD Patient? No    PAD/SET Patient? No      Pain Assessment   Currently in Pain? No/denies                Social History   Tobacco Use  Smoking Status Never  Smokeless Tobacco Never    Goals Met:  Independence with exercise equipment Exercise tolerated well No report of concerns or symptoms today Strength training completed today  Goals Unmet:  Not Applicable  Comments: Pt able to follow exercise prescription today without complaint.  Will continue to monitor for progression.    Dr. Emily Filbert is Medical Director for [place].  Dr. Ottie Glazier is Medical Director for [facility] Pulmonary Rehabilitation.

## 2022-07-28 ENCOUNTER — Encounter: Payer: BC Managed Care – PPO | Admitting: *Deleted

## 2022-07-28 DIAGNOSIS — D860 Sarcoidosis of lung: Secondary | ICD-10-CM | POA: Diagnosis not present

## 2022-07-28 DIAGNOSIS — J479 Bronchiectasis, uncomplicated: Secondary | ICD-10-CM | POA: Diagnosis not present

## 2022-07-28 DIAGNOSIS — I272 Pulmonary hypertension, unspecified: Secondary | ICD-10-CM | POA: Diagnosis not present

## 2022-07-28 DIAGNOSIS — G4730 Sleep apnea, unspecified: Secondary | ICD-10-CM | POA: Diagnosis not present

## 2022-07-28 NOTE — Progress Notes (Signed)
Daily Session Note  Patient Details  Name: Stacie [name] MRN: [id number] Date of Birth: 12/12/1955 Referring Provider:   Flowsheet Row Pulmonary Rehab from 07/25/2022 in [facility] Cardiac and Pulmonary Rehab  Referring Provider Ottie Glazier, MD       Encounter Date: 07/28/2022  Check In:  Session Check In - 07/28/22 1101       Check-In   Supervising physician immediately available to respond to emergencies See telemetry face sheet for immediately available ER MD    Location [facility]-Cardiac & Pulmonary Rehab    Staff Present Alberteen Sam, MA, RCEP, CCRP, CCET;Joseph DISH, [place], RN, [place]    Virtual Visit No    Medication changes reported     No    Fall or balance concerns reported    No    Warm-up and Cool-down Performed on first and last piece of equipment    Resistance Training Performed Yes    VAD Patient? No    PAD/SET Patient? No      Pain Assessment   Currently in Pain? No/denies                Social History   Tobacco Use  Smoking Status Never  Smokeless Tobacco Never    Goals Met:  Independence with exercise equipment Exercise tolerated well No report of concerns or symptoms today Strength training completed today  Goals Unmet:  Not Applicable  Comments: Pt able to follow exercise prescription today without complaint.  Will continue to monitor for progression.    Dr. Emily Filbert is Medical Director for [place].  Dr. Ottie Glazier is Medical Director for [facility] Pulmonary Rehabilitation.

## 2022-08-01 ENCOUNTER — Encounter: Payer: BC Managed Care – PPO | Admitting: *Deleted

## 2022-08-01 DIAGNOSIS — G4730 Sleep apnea, unspecified: Secondary | ICD-10-CM | POA: Diagnosis not present

## 2022-08-01 DIAGNOSIS — D860 Sarcoidosis of lung: Secondary | ICD-10-CM

## 2022-08-01 DIAGNOSIS — J479 Bronchiectasis, uncomplicated: Secondary | ICD-10-CM | POA: Diagnosis not present

## 2022-08-01 DIAGNOSIS — I272 Pulmonary hypertension, unspecified: Secondary | ICD-10-CM | POA: Diagnosis not present

## 2022-08-01 NOTE — Progress Notes (Signed)
Daily Session Note  Patient Details  Name: Stacie [name] MRN: [id number] Date of Birth: [birth year] Referring Provider:   Flowsheet Row Pulmonary Rehab from 07/25/2022 in [facility] Cardiac and Pulmonary Rehab  Referring Provider Ottie Glazier, MD       Encounter Date: 08/01/2022  Check In:  Session Check In - 08/01/22 1115       Check-In   Supervising physician immediately available to respond to emergencies See telemetry face sheet for immediately available ER MD    Location [facility]-Cardiac & Pulmonary Rehab    Staff Present Earlean Shawl, BS, ACSM CEP, Exercise Physiologist;Noah Tickle, BS, Exercise Physiologist;Azan Maneri Tamala Julian, RN, ADN    Virtual Visit No    Medication changes reported     No    Fall or balance concerns reported    No    Warm-up and Cool-down Performed on first and last piece of equipment    Resistance Training Performed Yes    VAD Patient? No    PAD/SET Patient? No      Pain Assessment   Currently in Pain? No/denies                Social History   Tobacco Use  Smoking Status Never  Smokeless Tobacco Never    Goals Met:  Independence with exercise equipment Exercise tolerated well No report of concerns or symptoms today Strength training completed today  Goals Unmet:  Not Applicable  Comments: Pt able to follow exercise prescription today without complaint.  Will continue to monitor for progression.    Dr. Emily Filbert is Medical Director for [place].  Dr. Ottie Glazier is Medical Director for [facility] Pulmonary Rehabilitation.

## 2022-08-08 ENCOUNTER — Encounter: Payer: BC Managed Care – PPO | Admitting: *Deleted

## 2022-08-08 DIAGNOSIS — D860 Sarcoidosis of lung: Secondary | ICD-10-CM

## 2022-08-08 DIAGNOSIS — J479 Bronchiectasis, uncomplicated: Secondary | ICD-10-CM | POA: Diagnosis not present

## 2022-08-08 DIAGNOSIS — G4730 Sleep apnea, unspecified: Secondary | ICD-10-CM | POA: Diagnosis not present

## 2022-08-08 DIAGNOSIS — I272 Pulmonary hypertension, unspecified: Secondary | ICD-10-CM | POA: Diagnosis not present

## 2022-08-08 NOTE — Progress Notes (Signed)
Daily Session Note  Patient Details  Name: Stacie [name] MRN: [id number] Date of Birth: 06/06/1956 Referring Provider:   Flowsheet Row Pulmonary Rehab from 07/25/2022 in [facility] Cardiac and Pulmonary Rehab  Referring Provider Ottie Glazier, MD       Encounter Date: 08/08/2022  Check In:  Session Check In - 08/08/22 1119       Check-In   Supervising physician immediately available to respond to emergencies See telemetry face sheet for immediately available ER MD    Location [facility]-Cardiac & Pulmonary Rehab    Staff Present Antionette Fairy, BS, Exercise Physiologist;Kelly Amedeo Plenty, BS, ACSM CEP, Exercise Physiologist;Jessica [place], MA, RCEP, CCRP, Mindi Curling, RN, [place]    Virtual Visit No    Medication changes reported     No    Fall or balance concerns reported    No    Warm-up and Cool-down Performed on first and last piece of equipment    Resistance Training Performed Yes    VAD Patient? No    PAD/SET Patient? No      Pain Assessment   Currently in Pain? No/denies                Social History   Tobacco Use  Smoking Status Never  Smokeless Tobacco Never    Goals Met:  Independence with exercise equipment Exercise tolerated well No report of concerns or symptoms today Strength training completed today  Goals Unmet:  Not Applicable  Comments: Pt able to follow exercise prescription today without complaint.  Will continue to monitor for progression.    Dr. Emily Filbert is Medical Director for [place].  Dr. Ottie Glazier is Medical Director for [facility] Pulmonary Rehabilitation.

## 2022-08-10 ENCOUNTER — Encounter: Payer: BC Managed Care – PPO | Admitting: *Deleted

## 2022-08-10 DIAGNOSIS — D860 Sarcoidosis of lung: Secondary | ICD-10-CM | POA: Diagnosis not present

## 2022-08-10 DIAGNOSIS — G4730 Sleep apnea, unspecified: Secondary | ICD-10-CM | POA: Diagnosis not present

## 2022-08-10 DIAGNOSIS — I272 Pulmonary hypertension, unspecified: Secondary | ICD-10-CM | POA: Diagnosis not present

## 2022-08-10 DIAGNOSIS — J479 Bronchiectasis, uncomplicated: Secondary | ICD-10-CM | POA: Diagnosis not present

## 2022-08-10 NOTE — Progress Notes (Signed)
Daily Session Note  Patient Details  Name: Stacie [name] MRN: [id number] Date of Birth: 1956-03-09 Referring Provider:   Flowsheet Row Pulmonary Rehab from 07/25/2022 in [facility] Cardiac and Pulmonary Rehab  Referring Provider Ottie Glazier, MD       Encounter Date: 08/10/2022  Check In:  Session Check In - 08/10/22 1042       Check-In   Supervising physician immediately available to respond to emergencies See telemetry face sheet for immediately available ER MD    Location [facility]-Cardiac & Pulmonary Rehab    Staff Present Antionette Fairy, BS, Exercise Physiologist;Joseph Rosebud Poles, RN, [place]    Virtual Visit No    Medication changes reported     No    Fall or balance concerns reported    No    Warm-up and Cool-down Performed on first and last piece of equipment    Resistance Training Performed Yes    VAD Patient? No    PAD/SET Patient? No      Pain Assessment   Currently in Pain? No/denies                Social History   Tobacco Use  Smoking Status Never  Smokeless Tobacco Never    Goals Met:  Independence with exercise equipment Exercise tolerated well No report of concerns or symptoms today Strength training completed today  Goals Unmet:  Not Applicable  Comments: Pt able to follow exercise prescription today without complaint.  Will continue to monitor for progression.    Dr. Emily Filbert is Medical Director for [place].  Dr. Ottie Glazier is Medical Director for [facility] Pulmonary Rehabilitation.

## 2022-08-11 ENCOUNTER — Encounter: Payer: BC Managed Care – PPO | Admitting: *Deleted

## 2022-08-15 ENCOUNTER — Encounter (INDEPENDENT_AMBULATORY_CARE_PROVIDER_SITE_OTHER): Payer: Self-pay

## 2022-08-15 ENCOUNTER — Encounter: Payer: BC Managed Care – PPO | Admitting: *Deleted

## 2022-08-15 DIAGNOSIS — I272 Pulmonary hypertension, unspecified: Secondary | ICD-10-CM | POA: Diagnosis not present

## 2022-08-15 DIAGNOSIS — D860 Sarcoidosis of lung: Secondary | ICD-10-CM | POA: Diagnosis not present

## 2022-08-15 DIAGNOSIS — G4730 Sleep apnea, unspecified: Secondary | ICD-10-CM | POA: Diagnosis not present

## 2022-08-15 DIAGNOSIS — J479 Bronchiectasis, uncomplicated: Secondary | ICD-10-CM | POA: Diagnosis not present

## 2022-08-15 NOTE — Progress Notes (Signed)
Daily Session Note  Patient Details  Name: Stacie [name] MRN: [id number] Date of Birth: 1956-06-23 Referring Provider:   Flowsheet Row Pulmonary Rehab from 07/25/2022 in [facility] Cardiac and Pulmonary Rehab  Referring Provider Ottie Glazier, MD       Encounter Date: 08/15/2022  Check In:  Session Check In - 08/15/22 1109       Check-In   Supervising physician immediately available to respond to emergencies See telemetry face sheet for immediately available ER MD    Location [facility]-Cardiac & Pulmonary Rehab    Staff Present Antionette Fairy, BS, Exercise Physiologist;Kelly Amedeo Plenty, BS, ACSM CEP, Exercise Physiologist;Meredith Sherryll Burger, RN Odelia Gage, RN, ADN    Virtual Visit No    Medication changes reported     No    Fall or balance concerns reported    No    Warm-up and Cool-down Performed on first and last piece of equipment    Resistance Training Performed Yes    VAD Patient? No    PAD/SET Patient? No      Pain Assessment   Currently in Pain? No/denies                Social History   Tobacco Use  Smoking Status Never  Smokeless Tobacco Never    Goals Met:  Independence with exercise equipment Exercise tolerated well No report of concerns or symptoms today Strength training completed today  Goals Unmet:  Not Applicable  Comments: Pt able to follow exercise prescription today without complaint.  Will continue to monitor for progression.    Dr. Emily Filbert is Medical Director for [place].  Dr. Ottie Glazier is Medical Director for [facility] Pulmonary Rehabilitation.

## 2022-08-17 ENCOUNTER — Encounter: Payer: Self-pay | Admitting: *Deleted

## 2022-08-17 ENCOUNTER — Encounter: Payer: BC Managed Care – PPO | Attending: Pulmonary Disease | Admitting: *Deleted

## 2022-08-17 DIAGNOSIS — D860 Sarcoidosis of lung: Secondary | ICD-10-CM | POA: Insufficient documentation

## 2022-08-17 NOTE — Progress Notes (Signed)
Pulmonary Individual Treatment Plan  Patient Details  Name: Stacie [name] MRN: [id number] Date of Birth: 1956/06/13 Referring Provider:   Flowsheet Row Pulmonary Rehab from 07/25/2022 in [facility] Cardiac and Pulmonary Rehab  Referring Provider Ottie Glazier, MD       Initial Encounter Date:  Flowsheet Row Pulmonary Rehab from 07/25/2022 in [facility] Cardiac and Pulmonary Rehab  Date 07/25/22       Visit Diagnosis: Sarcoidosis of lung ([place])  Patient's Home Medications on Admission:  Current Outpatient Medications:    acetaminophen (TYLENOL) 500 MG tablet, Take 500 mg by mouth every 6 (six) hours as needed for moderate pain or headache., Disp: , Rfl:    denosumab (PROLIA) 60 MG/ML SOSY injection, Inject 60 mg into the skin every 6 (six) months., Disp: , Rfl:    dorzolamide (TRUSOPT) 2 % ophthalmic solution, Place 1 drop into both eyes 2 (two) times daily., Disp: , Rfl:    escitalopram (LEXAPRO) 20 MG tablet, Take 1 tablet (20 mg total) by mouth daily., Disp: 90 tablet, Rfl: 3   Fluticasone-Umeclidin-Vilant (TRELEGY ELLIPTA) 200-62.5-25 MCG/ACT AEPB, Inhale into the lungs., Disp: , Rfl:    latanoprost (XALATAN) 0.005 % ophthalmic solution, Place 1 drop into the right eye 2 (two) times daily., Disp: , Rfl:    levothyroxine (SYNTHROID) 137 MCG tablet, Take 1 tablet (137 mcg total) by mouth daily before breakfast., Disp: 90 tablet, Rfl: 3   Menthol (ICY HOT) 5 % PTCH, Apply 1 patch topically daily as needed (pain)., Disp: , Rfl:    Multiple Vitamins-Minerals (MULTIVITAMIN WITH MINERALS) tablet, Take 1 tablet by mouth daily., Disp: , Rfl:    naproxen sodium (ALEVE) 220 MG tablet, Take 220 mg by mouth daily as needed (pain)., Disp: , Rfl:    NON FORMULARY, CPAP nightly, Disp: , Rfl:    OXYGEN, Inhale 2 L into the lungs daily as needed (During walking and activity). , Disp: , Rfl:    predniSONE (DELTASONE) 10 MG tablet, Take 2 tablets (20 mg total) by mouth daily. Resume only after the finishing  new Rx for prednisone, Disp: , Rfl:    Probiotic Product (PROBIOTIC PO), Take 1 capsule by mouth daily., Disp: , Rfl:    Treprostinil (TYVASO) 0.6 MG/ML SOLN, Inhale into the lungs See admin instructions. 12 breaths 4 times a day, Disp: , Rfl:   Past Medical History: Past Medical History:  Diagnosis Date   Acute renal failure ([place])    Acute respiratory failure ([place]) 10/23/2021   Acute respiratory failure with hypoxia (HCC)    ARF (acute respiratory failure) ([place]) 10/19/2021   Arrhythmia    patient unaware if this is current   Asthma    Chronic kidney disease    Critical lower limb ischemia (HCC)    Depression    GERD (gastroesophageal reflux disease)    Heart murmur    History of kidney stones    HOH (hard of hearing)    wear aids   Hyperthyroidism    Hypothyroidism    IBS (irritable bowel syndrome)    Pneumonia    Pulmonary hypertension ([place])    Sarcoid    Sarcoidosis    Seasonal allergies    Sleep apnea CPAP with O2   Stroke ([place]) 07/2020   watershed   Wears hearing aid in both ears     Tobacco Use: Social History   Tobacco Use  Smoking Status Never  Smokeless Tobacco Never    Labs: Review Flowsheet  More data exists  Latest Ref Rng & Units 07/16/2020 07/21/2020 01/14/2021 10/19/2021 06/24/2022  Labs for ITP Cardiac and Pulmonary Rehab  Cholestrol 0 - 200 mg/dL - 289  - - -  LDL (calc) 0 - 99 mg/dL - 198  - - -  HDL-C >40 mg/dL - 33  - - -  Trlycerides <150 mg/dL - 290  - - -  Hemoglobin A1c 4.8 - 5.6 % 6.4  - - - 6.1   PH, Arterial 7.350 - 7.450 7.41  - - - -  PCO2 arterial 32.0 - 48.0 mmHg 52  - - - -  Bicarbonate 20.0 - 28.0 mmol/L 33.0  - - 31.7  -  TCO2 22 - 32 mmol/L - - 30  - -  O2 Saturation % 95.2  - - 33.6  -     Pulmonary Assessment Scores:  Pulmonary Assessment Scores     Row Name 07/25/22 1331 08/15/22 1643       ADL UCSD   ADL Phase -- Entry    SOB Score total -- 88    Rest -- 0    Walk -- 3    Stairs -- 5    Bath -- 5    Dress -- 5     Shop -- 5      CAT Score   CAT Score -- 21      mMRC Score   mMRC Score 4 --             UCSD: Self-administered rating of dyspnea associated with activities of daily living (ADLs) 6-point scale (0 = "not at all" to 5 = "maximal or unable to do because of breathlessness")  Scoring Scores range from 0 to 120.  Minimally important difference is 5 units  CAT: CAT can identify the health impairment of COPD patients and is better correlated with disease progression.  CAT has a scoring range of zero to 40. The CAT score is classified into four groups of low (less than 10), medium (10 - 20), high (21-30) and very high (31-40) based on the impact level of disease on health status. A CAT score over 10 suggests significant symptoms.  A worsening CAT score could be explained by an exacerbation, poor medication adherence, poor inhaler technique, or progression of COPD or comorbid conditions.  CAT MCID is 2 points  mMRC: mMRC (Modified Medical Research Council) Dyspnea Scale is used to assess the degree of baseline functional disability in patients of respiratory disease due to dyspnea. No minimal important difference is established. A decrease in score of 1 point or greater is considered a positive change.   Pulmonary Function Assessment:   Exercise Target Goals: Exercise Program Goal: Individual exercise prescription set using results from initial 6 min walk test and THRR while considering  patient's activity barriers and safety.   Exercise Prescription Goal: Initial exercise prescription builds to 30-45 minutes a day of aerobic activity, 2-3 days per week.  Home exercise guidelines will be given to patient during program as part of exercise prescription that the participant will acknowledge.  Education: Aerobic Exercise: - Group verbal and visual presentation on the components of exercise prescription. Introduces F.I.T.T principle from ACSM for exercise prescriptions.  Reviews  F.I.T.T. principles of aerobic exercise including progression. Written material given at graduation. Flowsheet Row Pulmonary Rehab from 08/10/2018 in [facility] Cardiac and Pulmonary Rehab  Date 05/02/18  Educator [facility]  Instruction Review Code 1- Verbalizes Understanding       Education: Resistance Exercise: - Group verbal  and visual presentation on the components of exercise prescription. Introduces F.I.T.T principle from ACSM for exercise prescriptions  Reviews F.I.T.T. principles of resistance exercise including progression. Written material given at graduation.    Education: Exercise & Equipment Safety: - Individual verbal instruction and demonstration of equipment use and safety with use of the equipment. Flowsheet Row Pulmonary Rehab from 08/10/2022 in [facility] Cardiac and Pulmonary Rehab  Date 07/25/22  Educator NT  Instruction Review Code 1- Verbalizes Understanding       Education: Exercise Physiology & General Exercise Guidelines: - Group verbal and written instruction with models to review the exercise physiology of the cardiovascular system and associated critical values. Provides general exercise guidelines with specific guidelines to those with heart or lung disease.  Flowsheet Row Pulmonary Rehab from 08/10/2018 in [facility] Cardiac and Pulmonary Rehab  Date 07/11/18  Educator [facility]  Instruction Review Code 1- Verbalizes Understanding       Education: Flexibility, Balance, Mind/Body Relaxation: - Group verbal and visual presentation with interactive activity on the components of exercise prescription. Introduces F.I.T.T principle from ACSM for exercise prescriptions. Reviews F.I.T.T. principles of flexibility and balance exercise training including progression. Also discusses the mind body connection.  Reviews various relaxation techniques to help reduce and manage stress (i.e. Deep breathing, progressive muscle relaxation, and visualization). Balance handout provided to take home.  Written material given at graduation. Flowsheet Row Pulmonary Rehab from 08/10/2018 in [facility] Cardiac and Pulmonary Rehab  Date 08/08/18  Educator AS  Instruction Review Code 1- Verbalizes Understanding       Activity Barriers & Risk Stratification:  Activity Barriers & Cardiac Risk Stratification - 07/25/22 1310       Activity Barriers & Cardiac Risk Stratification   Activity Barriers Other (comment);Shortness of Breath    Comments stroke 2021- left foot 3 toes amputated; broke elbow after tripping over a cord ("healed")             6 Minute Walk:  6 Minute Walk     Row Name 07/25/22 1306         6 Minute Walk   Phase Initial     Distance 705 feet     Walk Time 5.83 minutes     # of Rest Breaks 1     MPH 1.37     METS 2.25     RPE 15     Perceived Dyspnea  4     VO2 Peak 7.88     Symptoms Yes (comment)     Comments SOB     Resting HR 116 bpm     Resting BP 124/66     Resting Oxygen Saturation  91 %     Exercise Oxygen Saturation  during 6 min walk 84 %     Max Ex. HR 133 bpm     Max Ex. BP 140/80     2 Minute Post BP 128/72       Interval HR   1 Minute HR 116     2 Minute HR 127     3 Minute HR 129     4 Minute HR 127     5 Minute HR 133     6 Minute HR 128     2 Minute Post HR 119     Interval Heart Rate? Yes       Interval Oxygen   Interval Oxygen? Yes     Baseline Oxygen Saturation % 91 %     1 Minute Oxygen Saturation %  91 %     1 Minute Liters of Oxygen 5 L  Pulsed     2 Minute Oxygen Saturation % 90 %     2 Minute Liters of Oxygen 5 L  Pulsed     3 Minute Oxygen Saturation % 84 %     3 Minute Liters of Oxygen 5 L  Pulsed     4 Minute Oxygen Saturation % 84 %     4 Minute Liters of Oxygen 5 L  Pulsed     5 Minute Oxygen Saturation % 86 %     5 Minute Liters of Oxygen 5 L  Pulsed     6 Minute Oxygen Saturation % 85 %     6 Minute Liters of Oxygen 5 L  Pulsed     2 Minute Post Oxygen Saturation % 90 %     2 Minute Post Liters of Oxygen 5 L   Pulsed             Oxygen Initial Assessment:  Oxygen Initial Assessment - 07/12/22 1334       Home Oxygen   Home Oxygen Device Portable Concentrator;Home Concentrator    Sleep Oxygen Prescription CPAP    Liters per minute 2    Home Exercise Oxygen Prescription Continuous    Liters per minute 2    Home Resting Oxygen Prescription Continuous   Doctor told her she could use prn during rest   Liters per minute 2    Compliance with Home Oxygen Use Yes      Intervention   Short Term Goals To learn and exhibit compliance with exercise, home and travel O2 prescription;To learn and understand importance of monitoring SPO2 with pulse oximeter and demonstrate accurate use of the pulse oximeter.;To learn and understand importance of maintaining oxygen saturations>88%;To learn and demonstrate proper pursed lip breathing techniques or other breathing techniques. ;To learn and demonstrate proper use of respiratory medications    Long  Term Goals Exhibits compliance with exercise, home  and travel O2 prescription;Verbalizes importance of monitoring SPO2 with pulse oximeter and return demonstration;Maintenance of O2 saturations>88%;Exhibits proper breathing techniques, such as pursed lip breathing or other method taught during program session;Compliance with respiratory medication;Demonstrates proper use of MDI's             Oxygen Re-Evaluation:  Oxygen Re-Evaluation     Row Name 07/25/22 1015             Goals/Expected Outcomes   Comments Reviewed PLB technique with pt.  Talked about how it works and it's importance in maintaining their exercise saturations.       Goals/Expected Outcomes Short: Become more profiecient at using PLB.   Long: Become independent at using PLB.                Oxygen Discharge (Final Oxygen Re-Evaluation):  Oxygen Re-Evaluation - 07/25/22 1015       Goals/Expected Outcomes   Comments Reviewed PLB technique with pt.  Talked about how it works and  it's importance in maintaining their exercise saturations.    Goals/Expected Outcomes Short: Become more profiecient at using PLB.   Long: Become independent at using PLB.             Initial Exercise Prescription:  Initial Exercise Prescription - 07/27/22 1100       Intensity   THRR 40-80% of Max Heartrate 118 - 141   New   Ratings of Perceived Exertion 11-13    Perceived Dyspnea  0-4             Perform Capillary Blood Glucose checks as needed.  Exercise Prescription Changes:   Exercise Prescription Changes     Row Name 07/25/22 1300 07/27/22 1100 08/09/22 1400         Response to Exercise   Blood Pressure (Admit) 124/66 -- 120/62     Blood Pressure (Exercise) 140/80 -- 138/76     Blood Pressure (Exit) 128/72 -- 140/82     Heart Rate (Admit) 116 bpm -- 97 bpm     Heart Rate (Exercise) 133 bpm -- 127 bpm     Heart Rate (Exit) 119 bpm -- 109 bpm     Oxygen Saturation (Admit) 91 % -- 96 %     Oxygen Saturation (Exercise) 84 % -- 89 %     Oxygen Saturation (Exit) 90 % -- 96 %     Rating of Perceived Exertion (Exercise) 14 -- 13     Perceived Dyspnea (Exercise) 4 -- 2     Symptoms SOB -- SOB     Comments 6MWT Results -- First 3 sessions in rehab     Duration -- -- Continue with 30 min of aerobic exercise without signs/symptoms of physical distress.     Intensity -- THRR New  Changed 118-141 THRR unchanged       Progression   Progression -- -- Continue to progress workloads to maintain intensity without signs/symptoms of physical distress.     Average METs -- -- 1.9       Resistance Training   Training Prescription -- -- Yes     Weight -- -- 3 lb     Reps -- -- 10-15       Interval Training   Interval Training -- -- No       Oxygen   Oxygen -- -- Continuous     Liters -- -- 3       Treadmill   MPH -- 1.5 1.1     Grade -- 0 0     Minutes -- 15 15     METs -- 2.15 1.84       REL-XR   Level -- -- 1     Minutes -- -- 15     METs -- -- 2.5       T5  Nustep   Level -- -- 1     Minutes -- -- 15     METs -- -- 1.6       Oxygen   Maintain Oxygen Saturation -- -- 88% or higher              Exercise Comments:   Exercise Comments     Row Name 07/25/22 1012           Exercise Comments First full day of exercise!  Patient was oriented to gym and equipment including functions, settings, policies, and procedures.  Patient's individual exercise prescription and treatment plan were reviewed.  All starting workloads were established based on the results of the 6 minute walk test done at initial orientation visit.  The plan for exercise progression was also introduced and progression will be customized based on patient's performance and goals.                Exercise Goals and Review:   Exercise Goals     Row Name 07/25/22 1330             Exercise Goals   Increase Physical  Activity Yes       Intervention Provide advice, education, support and counseling about physical activity/exercise needs.;Develop an individualized exercise prescription for aerobic and resistive training based on initial evaluation findings, risk stratification, comorbidities and participant's personal goals.       Expected Outcomes Short Term: Attend rehab on a regular basis to increase amount of physical activity.;Long Term: Add in home exercise to make exercise part of routine and to increase amount of physical activity.;Long Term: Exercising regularly at least 3-5 days a week.       Increase Strength and Stamina Yes       Intervention Provide advice, education, support and counseling about physical activity/exercise needs.;Develop an individualized exercise prescription for aerobic and resistive training based on initial evaluation findings, risk stratification, comorbidities and participant's personal goals.       Expected Outcomes Short Term: Increase workloads from initial exercise prescription for resistance, speed, and METs.;Short Term: Perform  resistance training exercises routinely during rehab and add in resistance training at home;Long Term: Improve cardiorespiratory fitness, muscular endurance and strength as measured by increased METs and functional capacity (6MWT)       Able to understand and use rate of perceived exertion (RPE) scale Yes       Intervention Provide education and explanation on how to use RPE scale       Expected Outcomes Short Term: Able to use RPE daily in rehab to express subjective intensity level;Long Term:  Able to use RPE to guide intensity level when exercising independently       Able to understand and use Dyspnea scale Yes       Intervention Provide education and explanation on how to use Dyspnea scale       Expected Outcomes Short Term: Able to use Dyspnea scale daily in rehab to express subjective sense of shortness of breath during exertion;Long Term: Able to use Dyspnea scale to guide intensity level when exercising independently       Knowledge and understanding of Target Heart Rate Range (THRR) Yes       Intervention Provide education and explanation of THRR including how the numbers were predicted and where they are located for reference       Expected Outcomes Short Term: Able to state/look up THRR;Short Term: Able to use daily as guideline for intensity in rehab;Long Term: Able to use THRR to govern intensity when exercising independently       Able to check pulse independently Yes       Intervention Provide education and demonstration on how to check pulse in carotid and radial arteries.;Review the importance of being able to check your own pulse for safety during independent exercise       Expected Outcomes Short Term: Able to explain why pulse checking is important during independent exercise;Long Term: Able to check pulse independently and accurately       Understanding of Exercise Prescription Yes       Intervention Provide education, explanation, and written materials on patient's individual  exercise prescription       Expected Outcomes Short Term: Able to explain program exercise prescription;Long Term: Able to explain home exercise prescription to exercise independently                Exercise Goals Re-Evaluation :  Exercise Goals Re-Evaluation     Row Name 07/25/22 1012 08/08/22 1108 08/09/22 1419         Exercise Goal Re-Evaluation   Exercise Goals Review Able to understand  and use rate of perceived exertion (RPE) scale;Able to understand and use Dyspnea scale;Knowledge and understanding of Target Heart Rate Range (THRR);Understanding of Exercise Prescription Understanding of Exercise Prescription;Increase Physical Activity;Increase Strength and Stamina Understanding of Exercise Prescription;Increase Physical Activity;Increase Strength and Stamina     Comments Reviewed RPE scale, THR and program prescription with pt today.  Pt voiced understanding and was given a copy of goals to take home. Stacie [name] feels that she is doing well with exercise. She states that she feels stronger since starting the program. She stated that she has not seen a huge benefit in her breathing but she wants to continue to work towards that goal. She does also state that she felt over exerted last week, but states it could have been due to being ill. We will continue to monitor her progress in the program. Stacie [name] is doing well in rehab. Her average overall MET level after her first 3 sessions was 1.9 METs. She also has done well walking on the treadmill at 1.1 mph without desaturating. She has tolerated 3 lb hand weights for resistance training as well. We will continue to monitor her progress in the program.     Expected Outcomes Short: Use RPE daily to regulate intensity.  Long: Follow program prescription in THR. Short: Continue to follow current exercise prescription. Long: Continue to increase strength and stamina. Short: Continue to increase workloads as tolerated. Long: Continue to increase strength and  stamina.              Discharge Exercise Prescription (Final Exercise Prescription Changes):  Exercise Prescription Changes - 08/09/22 1400       Response to Exercise   Blood Pressure (Admit) 120/62    Blood Pressure (Exercise) 138/76    Blood Pressure (Exit) 140/82    Heart Rate (Admit) 97 bpm    Heart Rate (Exercise) 127 bpm    Heart Rate (Exit) 109 bpm    Oxygen Saturation (Admit) 96 %    Oxygen Saturation (Exercise) 89 %    Oxygen Saturation (Exit) 96 %    Rating of Perceived Exertion (Exercise) 13    Perceived Dyspnea (Exercise) 2    Symptoms SOB    Comments First 3 sessions in rehab    Duration Continue with 30 min of aerobic exercise without signs/symptoms of physical distress.    Intensity THRR unchanged      Progression   Progression Continue to progress workloads to maintain intensity without signs/symptoms of physical distress.    Average METs 1.9      Resistance Training   Training Prescription Yes    Weight 3 lb    Reps 10-15      Interval Training   Interval Training No      Oxygen   Oxygen Continuous    Liters 3      Treadmill   MPH 1.1    Grade 0    Minutes 15    METs 1.84      REL-XR   Level 1    Minutes 15    METs 2.5      T5 Nustep   Level 1    Minutes 15    METs 1.6      Oxygen   Maintain Oxygen Saturation 88% or higher             Nutrition:  Target Goals: Understanding of nutrition guidelines, daily intake of sodium <1562m, cholesterol <2075m calories 30% from fat and 7% or less from saturated fats,  daily to have 5 or more servings of fruits and vegetables.  Education: All About Nutrition: -Group instruction provided by verbal, written material, interactive activities, discussions, models, and posters to present general guidelines for heart healthy nutrition including fat, fiber, MyPlate, the role of sodium in heart healthy nutrition, utilization of the nutrition label, and utilization of this knowledge for meal  planning. Follow up email sent as well. Written material given at graduation. Flowsheet Row Pulmonary Rehab from 08/10/2018 in [facility] Cardiac and Pulmonary Rehab  Date 06/27/18  Educator LB  Instruction Review Code 1- Verbalizes Understanding       Biometrics:  Pre Biometrics - 07/25/22 1330       Pre Biometrics   Height _0  (1.676 m)    Weight 188 lb 9.6 oz (85.5 kg)    Waist Circumference 37 inches    Hip Circumference 44 inches    Waist to Hip Ratio 0.84 %    BMI (Calculated) 30.46              Nutrition Therapy Plan and Nutrition Goals:  Nutrition Therapy & Goals - 07/20/22 1517       Nutrition Therapy   Diet Heart healthy, low Na, pulmonary MNT    Protein (specify units) 90-95g    Fiber 27 grams    Whole Grain Foods 3 servings    Saturated Fats 15 max. grams    Fruits and Vegetables 8 servings/day    Sodium 1.5 grams      Personal Nutrition Goals   Nutrition Goal ST: practice MyPlate guidelines (adding in 1-2 additional servings of non-starchy vegetables), be sure to include at least 1 good source of protein at meals and snacks, limit sodium by reading food labels and using other ways to flavor food LT: limit Na <1.5g/day, have at least 5 servings of vegetables/day    Comments 66 y.o. F admitted to pulmonary rehab for sarcoidosis of lung. PMHx includes hypothyroidism, HLD, CKD stg 3, asthma, HTN, OSA, elevated BG (A1C 6.4), anxiety. Relevant medications includes lexapro, synthroid, MVI, prednisone, probiotic, tyvaso, trilogy. B: healthy waffles occasionally. She will have eggs with sausage. S: cheese and crackers or yogurt (greek yogurt) L: egg salad or tuna salad with lettuce, rye bread. S: cheese and crackers or yogurt (greek yogurt) D: protein (meat or chicken) with a salad and more starchy vegetables. S: sometimes low calorie ice cream bar. She loves fruit and will have it with most meals. She limits dairy as this can make her not feel great. She will use avocado  oil and coconut aminos mostly, she does also use "no salt" which replaces sodium chloride with potassium choride. Drinks: Coffee (sometimes), water. She reports that her daughter is health conscious and has taken care of her for about 2 years - she is now going back to work. Discussed general heart healthy eating, sodium, and pulmonary MNT. Also reviewed ways to maintain a healthy BG. Advised to stop using the "no salt" and to decrease the salt in her processed foods as well as adding other ways to flavor her food. Since her protein needs are higher, suggested she have at least one good source of protein at each meal.      Intervention Plan   Intervention Prescribe, educate and counsel regarding individualized specific dietary modifications aiming towards targeted core components such as weight, hypertension, lipid management, diabetes, heart failure and other comorbidities.    Expected Outcomes Short Term Goal: Understand basic principles of dietary content, such as calories,  fat, sodium, cholesterol and nutrients.;Short Term Goal: A plan has been developed with personal nutrition goals set during dietitian appointment.;Long Term Goal: Adherence to prescribed nutrition plan.             Nutrition Assessments:  MEDIFICTS Score Key: ?70 Need to make dietary changes  40-70 Heart Healthy Diet ? 40 Therapeutic Level Cholesterol Diet  Flowsheet Row Pulmonary Rehab from 08/15/2022 in [facility] Cardiac and Pulmonary Rehab  Picture Your Plate Total Score on Admission 58      Picture Your Plate Scores: <73 Unhealthy dietary pattern with much room for improvement. 41-50 Dietary pattern unlikely to meet recommendations for good health and room for improvement. 51-60 More healthful dietary pattern, with some room for improvement.  >60 Healthy dietary pattern, although there may be some specific behaviors that could be improved.   Nutrition Goals Re-Evaluation:  Nutrition Goals Re-Evaluation     [place]  Name 08/08/22 1113             Goals   Nutrition Goal ST: practice MyPlate guidelines (adding in 1-2 additional servings of non-starchy vegetables), be sure to include at least 1 good source of protein at meals and snacks, limit sodium by reading food labels and using other ways to flavor food LT: limit Na <1.5g/day, have at least 5 servings of vegetables/day       Comment She has been making smoothies regularly and has been adding more vegetables than fruit to them to help increase her intake. Including fruits and vegetables daily. She continues to read food labels regularly and feels that she has been able to maintain new healthier habits       Expected Outcome Short: She will continue to eat fruits and vegetables daily, and to read food labels. Long: Continue to follow healthy eating patterns discussed with RD.                Nutrition Goals Discharge (Final Nutrition Goals Re-Evaluation):  Nutrition Goals Re-Evaluation - 08/08/22 1113       Goals   Nutrition Goal ST: practice MyPlate guidelines (adding in 1-2 additional servings of non-starchy vegetables), be sure to include at least 1 good source of protein at meals and snacks, limit sodium by reading food labels and using other ways to flavor food LT: limit Na <1.5g/day, have at least 5 servings of vegetables/day    Comment She has been making smoothies regularly and has been adding more vegetables than fruit to them to help increase her intake. Including fruits and vegetables daily. She continues to read food labels regularly and feels that she has been able to maintain new healthier habits    Expected Outcome Short: She will continue to eat fruits and vegetables daily, and to read food labels. Long: Continue to follow healthy eating patterns discussed with RD.             Psychosocial: Target Goals: Acknowledge presence or absence of significant depression and/or stress, maximize coping skills, provide positive support  system. Participant is able to verbalize types and ability to use techniques and skills needed for reducing stress and depression.   Education: Stress, Anxiety, and Depression - Group verbal and visual presentation to define topics covered.  Reviews how body is impacted by stress, anxiety, and depression.  Also discusses healthy ways to reduce stress and to treat/manage anxiety and depression.  Written material given at graduation. Flowsheet Row Pulmonary Rehab from 08/10/2022 in [facility] Cardiac and Pulmonary Rehab  Date 08/10/22  Educator [facility]  Instruction Review Code 1- Verbalizes Understanding       Education: Sleep Hygiene -Provides group verbal and written instruction about how sleep can affect your health.  Define sleep hygiene, discuss sleep cycles and impact of sleep habits. Review good sleep hygiene tips.  Flowsheet Row Pulmonary Rehab from 08/10/2018 in [facility] Cardiac and Pulmonary Rehab  Date 04/11/18  Educator [facility]  Instruction Review Code 1- Verbalizes Understanding       Initial Review & Psychosocial Screening:  Initial Psych Review & Screening - 07/12/22 1339       Initial Review   Current issues with Current Stress Concerns    Source of Stress Concerns Chronic Illness      Family Dynamics   Good Support System? Yes   family and friends     Barriers   Psychosocial barriers to participate in program There are no identifiable barriers or psychosocial needs.;The patient should benefit from training in stress management and relaxation.      Screening Interventions   Interventions Encouraged to exercise;Provide feedback about the scores to participant;To provide support and resources with identified psychosocial needs    Expected Outcomes Short Term goal: Utilizing psychosocial counselor, staff and physician to assist with identification of specific Stressors or current issues interfering with healing process. Setting desired goal for each stressor or current issue  identified.;Long Term Goal: Stressors or current issues are controlled or eliminated.;Short Term goal: Identification and review with participant of any Quality of Life or Depression concerns found by scoring the questionnaire.;Long Term goal: The participant improves quality of Life and PHQ9 Scores as seen by post scores and/or verbalization of changes             Quality of Life Scores:  Scores of 19 and below usually indicate a poorer quality of life in these areas.  A difference of  2-3 points is a clinically meaningful difference.  A difference of 2-3 points in the total score of the Quality of Life Index has been associated with significant improvement in overall quality of life, self-image, physical symptoms, and general health in studies assessing change in quality of life.  PHQ-9: Review Flowsheet  More data exists      07/20/2022 06/24/2022 10/13/2021 09/28/2021 09/21/2020  Depression screen PHQ 2/9  Decreased Interest 0 0 0 0 0  Down, Depressed, Hopeless 0 0 0 0 0  PHQ - 2 Score 0 0 0 0 0  Altered sleeping 1 0 0 0 1  Tired, decreased energy 1 0 0 0 1  Change in appetite 0 0 0 0 0  Feeling bad or failure about yourself  0 0 0 0 0  Trouble concentrating 1 0 0 0 0  Moving slowly or fidgety/restless 0 0 0 0 0  Suicidal thoughts 0 0 0 0 0  PHQ-9 Score 3 0 0 0 2  Difficult doing work/chores Not difficult at all Not difficult at all Not difficult at all Not difficult at all Not difficult at all   Interpretation of Total Score  Total Score Depression Severity:  1-4 = Minimal depression, 5-9 = Mild depression, 10-14 = Moderate depression, 15-19 = Moderately severe depression, 20-27 = Severe depression   Psychosocial Evaluation and Intervention:  Psychosocial Evaluation - 07/12/22 1352       Psychosocial Evaluation & Interventions   Interventions Encouraged to exercise with the program and follow exercise prescription;Relaxation education    Comments Stacie [name] is returning to  pulmonary rehab by request. She had completed the program  in 2019 and after having a stroke and increased breathing issues, she asked to come back. She wants to be able to play with her 11 and soon to be 12 grandkids, and states she knows her breathing may not get better but she wants to feel better.  Her strength and stamina are also things she wants to work on. She does not report any sleep concerns. She reports that sometimes she may feel down about all the medical things that have happened recently, but she doesn't stay there long because of her amazing support system.    Expected Outcomes SHort: attend pulmonary rehab for educaiton and exercise. Long: develop and maintain positive self care habits.    Continue Psychosocial Services  Follow up required by staff             Psychosocial Re-Evaluation:  Psychosocial Re-Evaluation     [place] Name 07/28/22 1130             Psychosocial Re-Evaluation   Current issues with Current Psychotropic Meds;Current Stress Concerns       Comments Stacie [name] states that she is stressed due to her stroke and hear breathing issues. He has a positive outlook on improving her health. Her breathing has got worse over the last couple years because she was not able to exercise much.       Expected Outcomes Short: Start [name] to help with mood. Long: Maintain a healthy mental state       Interventions Relaxation education;Stress management education;Encouraged to attend Pulmonary Rehabilitation for the exercise       Continue Psychosocial Services  Follow up required by staff                Psychosocial Discharge (Final Psychosocial Re-Evaluation):  Psychosocial Re-Evaluation - 07/28/22 1130       Psychosocial Re-Evaluation   Current issues with Current Psychotropic Meds;Current Stress Concerns    Comments Stacie [name] states that she is stressed due to her stroke and hear breathing issues. He has a positive outlook on improving her health. Her breathing has  got worse over the last couple years because she was not able to exercise much.    Expected Outcomes Short: Start [name] to help with mood. Long: Maintain a healthy mental state    Interventions Relaxation education;Stress management education;Encouraged to attend Pulmonary Rehabilitation for the exercise    Continue Psychosocial Services  Follow up required by staff             Education: Education Goals: Education classes will be provided on a weekly basis, covering required topics. Participant will state understanding/return demonstration of topics presented.  Learning Barriers/Preferences:   General Pulmonary Education Topics:  Infection Prevention: - Provides verbal and written material to individual with discussion of infection control including proper hand washing and proper equipment cleaning during exercise session. Flowsheet Row Pulmonary Rehab from 08/10/2022 in [facility] Cardiac and Pulmonary Rehab  Date 07/27/22  Educator NT  Instruction Review Code 1- Verbalizes Understanding       Falls Prevention: - Provides verbal and written material to individual with discussion of falls prevention and safety. Flowsheet Row Pulmonary Rehab from 08/10/2022 in [facility] Cardiac and Pulmonary Rehab  Date 07/27/22  Educator NT  Instruction Review Code 1- Verbalizes Understanding       Chronic Lung Disease Review: - Group verbal instruction with posters, models, PowerPoint presentations and videos,  to review new updates, new respiratory medications, new advancements in procedures and treatments. Providing information on websites and "  800" numbers for continued self-education. Includes information about supplement oxygen, available portable oxygen systems, continuous and intermittent flow rates, oxygen safety, concentrators, and Medicare reimbursement for oxygen. Explanation of Pulmonary Drugs, including class, frequency, complications, importance of spacers, rinsing mouth after steroid  MDI's, and proper cleaning methods for nebulizers. Review of basic lung anatomy and physiology related to function, structure, and complications of lung disease. Review of risk factors. Discussion about methods for diagnosing sleep apnea and types of masks and machines for OSA. Includes a review of the use of types of environmental controls: home humidity, furnaces, filters, dust mite/pet prevention, HEPA vacuums. Discussion about weather changes, air quality and the benefits of nasal washing. Instruction on Warning signs, infection symptoms, calling MD promptly, preventive modes, and value of vaccinations. Review of effective airway clearance, coughing and/or vibration techniques. Emphasizing that all should Create an Action Plan. Written material given at graduation. Flowsheet Row Pulmonary Rehab from 08/10/2018 in [facility] Cardiac and Pulmonary Rehab  Date 06/29/18  Educator [facility]  Instruction Review Code 1- Verbalizes Understanding       AED/CPR: - Group verbal and written instruction with the use of models to demonstrate the basic use of the AED with the basic ABC's of resuscitation.    Anatomy and Cardiac Procedures: - Group verbal and visual presentation and models provide information about basic cardiac anatomy and function. Reviews the testing methods done to diagnose heart disease and the outcomes of the test results. Describes the treatment choices: Medical Management, Angioplasty, or Coronary Bypass Surgery for treating various heart conditions including Myocardial Infarction, Angina, Valve Disease, and Cardiac Arrhythmias.  Written material given at graduation. Flowsheet Row Pulmonary Rehab from 08/10/2018 in [facility] Cardiac and Pulmonary Rehab  Date 05/16/18  Educator [facility]  Instruction Review Code 5- Refused Teaching       Medication Safety: - Group verbal and visual instruction to review commonly prescribed medications for heart and lung disease. Reviews the medication, class of the drug,  and side effects. Includes the steps to properly store meds and maintain the prescription regimen.  Written material given at graduation. Flowsheet Row Pulmonary Rehab from 08/10/2018 in [facility] Cardiac and Pulmonary Rehab  Date 08/10/18  Educator [facility]  Instruction Review Code 1- Verbalizes Understanding       Other: -Provides group and verbal instruction on various topics (see comments)   Knowledge Questionnaire Score:  Knowledge Questionnaire Score - 08/15/22 1642       Knowledge Questionnaire Score   Pre Score 11/18              Core Components/Risk Factors/Patient Goals at Admission:  Personal Goals and Risk Factors at Admission - 07/12/22 1337       Core Components/Risk Factors/Patient Goals on Admission    Weight Management --    Intervention --    Expected Outcomes --    Improve shortness of breath with ADL's Yes    Intervention Provide education, individualized exercise plan and daily activity instruction to help decrease symptoms of SOB with activities of daily living.    Expected Outcomes Short Term: Improve cardiorespiratory fitness to achieve a reduction of symptoms when performing ADLs;Long Term: Be able to perform more ADLs without symptoms or delay the onset of symptoms    Hypertension Yes    Intervention Provide education on lifestyle modifcations including regular physical activity/exercise, weight management, moderate sodium restriction and increased consumption of fresh fruit, vegetables, and low fat dairy, alcohol moderation, and smoking cessation.;Monitor prescription use compliance.    Expected  Outcomes Short Term: Continued assessment and intervention until BP is < 140/41m HG in hypertensive participants. < 130/84mHG in hypertensive participants with diabetes, heart failure or chronic kidney disease.;Long Term: Maintenance of blood pressure at goal levels.    Lipids Yes    Intervention Provide education and support for participant on nutrition &  aerobic/resistive exercise along with prescribed medications to achieve LDL <7082mHDL >33m3m  Expected Outcomes Short Term: Participant states understanding of desired cholesterol values and is compliant with medications prescribed. Participant is following exercise prescription and nutrition guidelines.;Long Term: Cholesterol controlled with medications as prescribed, with individualized exercise RX and with personalized nutrition plan. Value goals: LDL < 70mg57mL > 40 mg.             Education:Diabetes - Individual verbal and written instruction to review signs/symptoms of diabetes, desired ranges of glucose level fasting, after meals and with exercise. Acknowledge that pre and post exercise glucose checks will be done for 3 sessions at entry of program.   Know Your Numbers and Heart Failure: - Group verbal and visual instruction to discuss disease risk factors for cardiac and pulmonary disease and treatment options.  Reviews associated critical values for Overweight/Obesity, Hypertension, Cholesterol, and Diabetes.  Discusses basics of heart failure: signs/symptoms and treatments.  Introduces Heart Failure Zone chart for action plan for heart failure.  Written material given at graduation. Flowsheet Row Pulmonary Rehab [facility]  Date 07/27/22  Educator SB  Instruction Review Code 1- Verbalizes Understanding       Core Components/Risk Factors/Patient Goals Review:   Goals and Risk Factor Review     Row Name 07/28/22 1127 08/08/22 1114           Core Components/Risk Factors/Patient Goals Review   Personal Goals Review Improve shortness of breath with ADL's Improve shortness of breath with ADL's      Review Spoke to patient about their shortness of breath and what they can do to improve. Patient has been informed of breathing techniques when starting the program. Patient is informed to tell staff if they have had any med changes and that  certain meds they are taking or not taking can be causing shortness of breath. Spoke to patient about their shortness of breath and what they can do to improve. Patient has been informed of breathing techniques when starting the program. Patient is informed to tell staff if they have had any med changes and that certain meds they are taking or not taking can be causing shortness of breath.      Expected Outcomes Short: Attend [name] regularly to improve shortness of breath with ADL's. Long: maintain independence with ADL's Short: Attend [name] regularly to improve shortness of breath with ADL's. Long: maintain independence with ADL's               Core Components/Risk Factors/Patient Goals at Discharge (Final Review):   Goals and Risk Factor Review - 08/08/22 1114       Core Components/Risk Factors/Patient Goals Review   Personal Goals Review Improve shortness of breath with ADL's    Review Spoke to patient about their shortness of breath and what they can do to improve. Patient has been informed of breathing techniques when starting the program. Patient is informed to tell staff if they have had any med changes and that certain meds they are taking or not taking can be causing shortness of breath.    Expected Outcomes Short:  Attend [name] regularly to improve shortness of breath with ADL's. Long: maintain independence with ADL's             ITP Comments:  ITP Comments     Row Name 07/12/22 1346 07/20/22 1546 07/25/22 1012 08/17/22 0942     ITP Comments Initial telephone orientation completed. Diagnosis can be found in [facility] 8/16. EP orientation scheduled for Wednesday 10/4 at 2:30. Patient presented for orientation today, 10/4. Patient wore open-toed shoes and therefore could not complete 6MWT. Will do 6MWT on Monday, 10/9. First full day of exercise!  Patient was oriented to gym and equipment including functions, settings, policies, and procedures.  Patient's individual exercise  prescription and treatment plan were reviewed.  All starting workloads were established based on the results of the 6 minute walk test done at initial orientation visit.  The plan for exercise progression was also introduced and progression will be customized based on patient's performance and goals. 30 Day review completed. Medical Director ITP review done, changes made as directed, and signed approval by Medical Director.   NEW TO PROGRAM             Comments:

## 2022-08-17 NOTE — Progress Notes (Signed)
Daily Session Note  Patient Details  Name: Stacie [name] MRN: [id number] Date of Birth: Oct 31, 1955 Referring Provider:   Flowsheet Row Pulmonary Rehab from 07/25/2022 in [facility] Cardiac and Pulmonary Rehab  Referring Provider Ottie Glazier, MD       Encounter Date: 08/17/2022  Check In:  Session Check In - 08/17/22 1114       Check-In   Supervising physician immediately available to respond to emergencies See telemetry face sheet for immediately available ER MD    Location [facility]-Cardiac & Pulmonary Rehab    Staff Present Antionette Fairy, BS, Exercise Physiologist;Meredith Sherryll Burger, RN BSN;Joseph Rosebud Poles, RN, [place]    Virtual Visit No    Medication changes reported     No    Fall or balance concerns reported    No    Warm-up and Cool-down Performed on first and last piece of equipment    Resistance Training Performed Yes    VAD Patient? No    PAD/SET Patient? No      Pain Assessment   Currently in Pain? No/denies                Social History   Tobacco Use  Smoking Status Never  Smokeless Tobacco Never    Goals Met:  Independence with exercise equipment Exercise tolerated well No report of concerns or symptoms today Strength training completed today  Goals Unmet:  Not Applicable  Comments: Pt able to follow exercise prescription today without complaint.  Will continue to monitor for progression.    Dr. Emily Filbert is Medical Director for [place].  Dr. Ottie Glazier is Medical Director for [facility] Pulmonary Rehabilitation.

## 2022-08-18 ENCOUNTER — Encounter: Payer: BC Managed Care – PPO | Admitting: *Deleted

## 2022-08-19 DIAGNOSIS — I10 Essential (primary) hypertension: Secondary | ICD-10-CM | POA: Diagnosis not present

## 2022-08-19 DIAGNOSIS — G4733 Obstructive sleep apnea (adult) (pediatric): Secondary | ICD-10-CM | POA: Diagnosis not present

## 2022-08-22 ENCOUNTER — Encounter: Payer: BC Managed Care – PPO | Admitting: *Deleted

## 2022-08-23 DIAGNOSIS — I10 Essential (primary) hypertension: Secondary | ICD-10-CM | POA: Diagnosis not present

## 2022-08-23 DIAGNOSIS — G4733 Obstructive sleep apnea (adult) (pediatric): Secondary | ICD-10-CM | POA: Diagnosis not present

## 2022-08-24 ENCOUNTER — Encounter: Payer: BC Managed Care – PPO | Admitting: *Deleted

## 2022-08-25 ENCOUNTER — Encounter: Payer: BC Managed Care – PPO | Admitting: *Deleted

## 2022-08-29 ENCOUNTER — Encounter: Payer: BC Managed Care – PPO | Admitting: *Deleted

## 2022-08-29 DIAGNOSIS — D860 Sarcoidosis of lung: Secondary | ICD-10-CM

## 2022-08-29 NOTE — Progress Notes (Signed)
Daily Session Note  Patient Details  Name: Stacie [name] MRN: [id number] Date of Birth: 03/15/1956 Referring Provider:   Flowsheet Row Pulmonary Rehab from 07/25/2022 in [facility] Cardiac and Pulmonary Rehab  Referring Provider Fuad Aleskerov, MD       Encounter Date: 08/29/2022  Check In:  Session Check In - 08/29/22 1057       Check-In   Supervising physician immediately available to respond to emergencies See telemetry face sheet for immediately available ER MD    Location [facility]-Cardiac & Pulmonary Rehab    Staff Present Noah Tickle, BS, Exercise Physiologist;Kelly Hayes, BS, ACSM CEP, Exercise Physiologist; , RN, ADN    Virtual Visit No    Medication changes reported     No    Fall or balance concerns reported    No    Warm-up and Cool-down Performed on first and last piece of equipment    Resistance Training Performed Yes    VAD Patient? No    PAD/SET Patient? No      Pain Assessment   Currently in Pain? No/denies                Social History   Tobacco Use  Smoking Status Never  Smokeless Tobacco Never    Goals Met:  Independence with exercise equipment Exercise tolerated well No report of concerns or symptoms today Strength training completed today  Goals Unmet:  Not Applicable  Comments: Pt able to follow exercise prescription today without complaint.  Will continue to monitor for progression.    Dr. Mark Miller is Medical Director for [facility].  Dr. Fuad Aleskerov is Medical Director for [name] Pulmonary [facility]. 

## 2022-09-01 ENCOUNTER — Encounter: Payer: BC Managed Care – PPO | Admitting: *Deleted

## 2022-09-01 DIAGNOSIS — D860 Sarcoidosis of lung: Secondary | ICD-10-CM

## 2022-09-01 NOTE — Progress Notes (Signed)
Daily Session Note  Patient Details  Name: Stacie [name] MRN: [id number] Date of Birth: June 22, 1956 Referring Provider:   Flowsheet Row Pulmonary Rehab from 07/25/2022 in [facility] Cardiac and Pulmonary Rehab  Referring Provider Ottie Glazier, MD       Encounter Date: 09/01/2022  Check In:  Session Check In - 09/01/22 1139       Check-In   Supervising physician immediately available to respond to emergencies See telemetry face sheet for immediately available ER MD    Location [facility]-Cardiac & Pulmonary Rehab    Staff Present Renita Papa, RN BSN;Jessica Whitfield, MA, RCEP, CCRP, CCET;Joseph Shannon, [place], RN, [place]    Virtual Visit No    Medication changes reported     No    Fall or balance concerns reported    No    Warm-up and Cool-down Performed on first and last piece of equipment    Resistance Training Performed Yes    VAD Patient? No    PAD/SET Patient? No      Pain Assessment   Currently in Pain? No/denies                Social History   Tobacco Use  Smoking Status Never  Smokeless Tobacco Never    Goals Met:  Independence with exercise equipment Exercise tolerated well No report of concerns or symptoms today Strength training completed today  Goals Unmet:  Not Applicable  Comments: Pt able to follow exercise prescription today without complaint.  Will continue to monitor for progression.    Dr. Emily Filbert is Medical Director for [place].  Dr. Ottie Glazier is Medical Director for [facility] Pulmonary Rehabilitation.

## 2022-09-05 ENCOUNTER — Encounter: Payer: BC Managed Care – PPO | Admitting: *Deleted

## 2022-09-05 DIAGNOSIS — D860 Sarcoidosis of lung: Secondary | ICD-10-CM | POA: Diagnosis not present

## 2022-09-05 NOTE — Progress Notes (Signed)
Daily Session Note  Patient Details  Name: Stacie [name] MRN: [id number] Date of Birth: [birth year] Referring Provider:   Flowsheet Row Pulmonary Rehab from 07/25/2022 in [facility]  Referring Provider Ottie Glazier, MD       Encounter Date: 09/05/2022  Check In:  Session Check In - 09/05/22 1117       Check-In   Supervising physician immediately available to respond to emergencies See telemetry face sheet for immediately available ER MD    Location [facility]-Cardiac & Pulmonary Rehab    Staff Present Renita Papa, RN Odelia Gage, RN, Doyce Para, BS, ACSM CEP, Exercise Physiologist;Noah Tickle, BS, Exercise Physiologist    Virtual Visit No    Medication changes reported     No    Fall or balance concerns reported    No    Warm-up and Cool-down Performed on first and last piece of equipment    Resistance Training Performed Yes    VAD Patient? No    PAD/SET Patient? No      Pain Assessment   Currently in Pain? No/denies                Social History   Tobacco Use  Smoking Status Never  Smokeless Tobacco Never    Goals Met:  Independence with exercise equipment Exercise tolerated well No report of concerns or symptoms today Strength training completed today  Goals Unmet:  Not Applicable  Comments: Pt able to follow exercise prescription today without complaint.  Will continue to monitor for progression.    Dr. Emily Filbert is Medical Director for [place].  Dr. Ottie Glazier is Medical Director for [facility].

## 2022-09-07 ENCOUNTER — Encounter: Payer: BC Managed Care – PPO | Admitting: *Deleted

## 2022-09-07 DIAGNOSIS — D860 Sarcoidosis of lung: Secondary | ICD-10-CM | POA: Diagnosis not present

## 2022-09-07 NOTE — Progress Notes (Signed)
Daily Session Note  Patient Details  Name: Stacie [name] MRN: [id number] Date of Birth: Dec 10, 1955 Referring Provider:   Flowsheet Row Pulmonary Rehab from 07/25/2022 in [facility] Pul[facility] Re[facility]  Referring Provider Ottie Glazier, MD       Encounter Date: 09/07/2022  Check In:  Session Check In - 09/07/22 1113       Check-In   Supervising physician immediately available to respond to emergencies See telemetry face sheet for immediately available ER MD    Location [facility] Pul[facility]    Staff Present Heath Lark, RN, BSN, CCRP;Kellis Mcadam Tamala Julian, RN, ADN;Meredith Sherryll Burger, RN Margurite Auerbach, MS, ASCM CEP, Exercise Physiologist    Virtual Visit No    Medication changes reported     No    Fall or balance concerns reported    No    Warm-up and Cool-down Performed on first and last piece of equipment    Resistance Training Performed Yes    VAD Patient? No    PAD/SET Patient? No      Pain Assessment   Currently in Pain? No/denies                Social History   Tobacco Use  Smoking Status Never  Smokeless Tobacco Never    Goals Met:  Independence with exercise equipment Exercise tolerated well No report of concerns or symptoms today Strength training completed today  Goals Unmet:  Not Applicable  Comments: Pt able to follow exercise prescription today without complaint.  Will continue to monitor for progression.    Dr. Emily Filbert is Medical Director for [place].  Dr. Ottie Glazier is Medical Director for [facility].

## 2022-09-10 IMAGING — MR MR FOOT*L* WO/W CM
9 series · 40 of 40 positions shown · IV contrast (8ml Gadavist)
Comparison: CT left foot 02/01/2021.

CLINICAL DATA: Patient status post amputation of the left great toe
and second metatarsal head 01/14/2021. Prior history of third toe
amputation. Continued left foot infection.

EXAM:
MRI OF THE LEFT FOREFOOT WITHOUT AND WITH CONTRAST
TECHNIQUE: Multiplanar, multisequence MR imaging of the left forefoot was
performed both before and after administration of intravenous
contrast.
CONTRAST:  8 mL GADAVIST IV SOLN

[Series 7: T1 · axial · left · 3.0mm · 0.70mm/px · z∈[-69,+19]mm · 2 of 24 slices shown (1 of 2)]
[im 1/24]
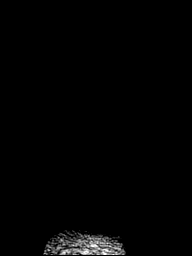
[im 24/24]
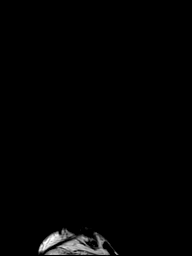

[Series 9: T2 · axial · left · 3.0mm · 0.70mm/px · z∈[-69,+19]mm · 3 of 24 slices shown (1 of 2)]
[im 1/24]
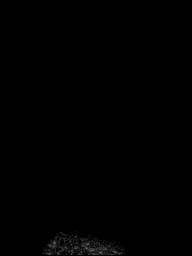
[im 12/24]
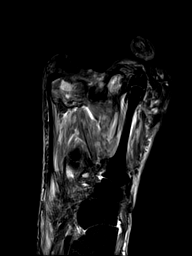
[im 24/24]
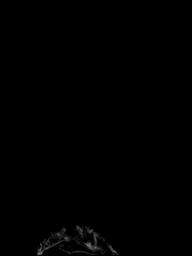

[Series 10: STIR · sagittal · left · 3.0mm · 0.62mm/px · 4 of 31 slices shown]
[im 1/31]
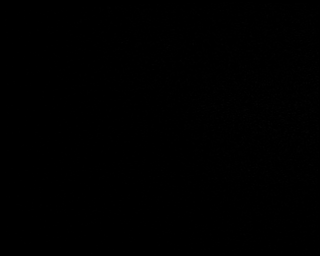
[im 11/31]
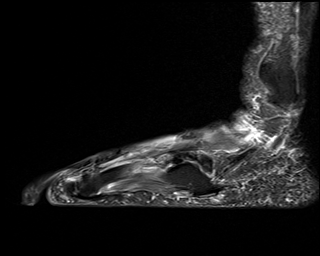
[im 21/31]
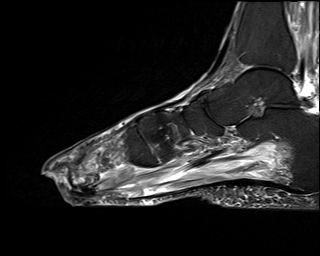
[im 31/31]
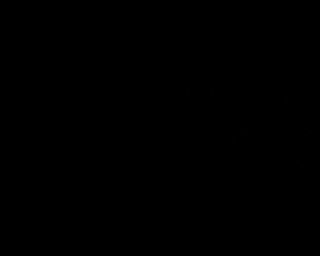

[Series 11: T1 · coronal · left · 3.0mm · 0.38mm/px · 6 of 45 slices shown (2 of 2)]
[im 1/45]
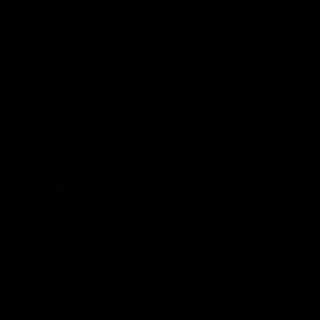
[im 9/45]
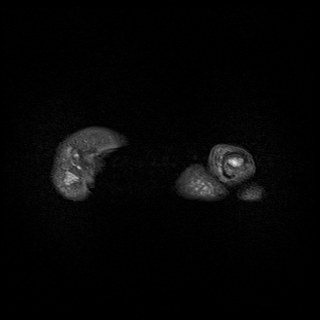
[im 18/45]
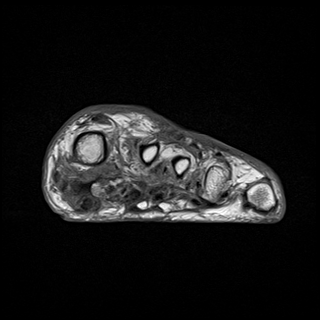
[im 27/45]
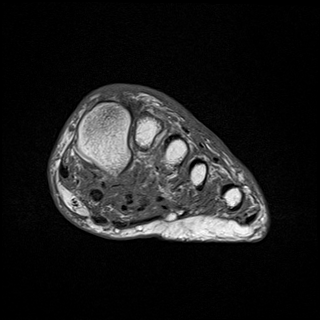
[im 36/45]
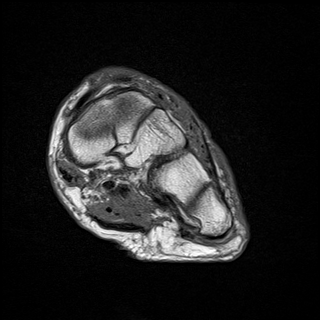
[im 45/45]
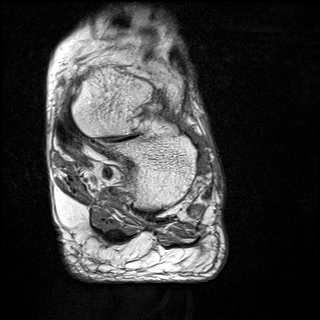

[Series 13: T2 · coronal · left · 3.0mm · 0.50mm/px · 6 of 44 slices shown (2 of 2)]
[im 1/44]
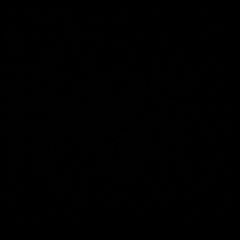
[im 9/44]
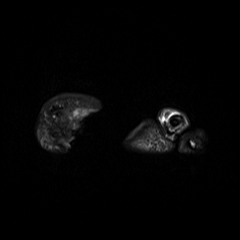
[im 18/44]
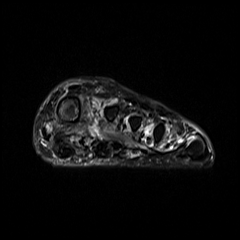
[im 26/44]
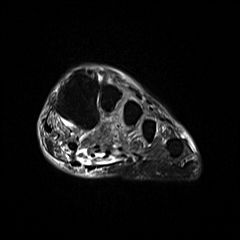
[im 35/44]
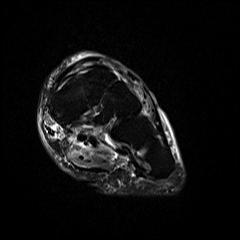
[im 44/44]
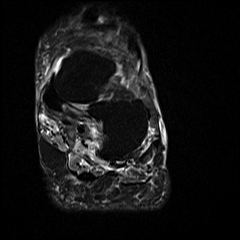

[Series 14: T1 fat-sat · coronal · non-contrast · left · 3.0mm · 0.47mm/px · 6 of 45 slices shown (1 of 3)]
[im 1/45]
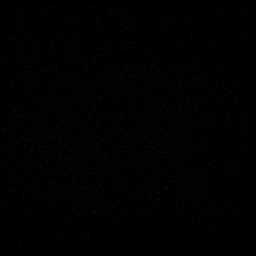
[im 9/45]
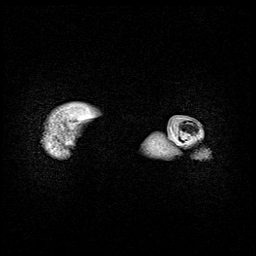
[im 18/45]
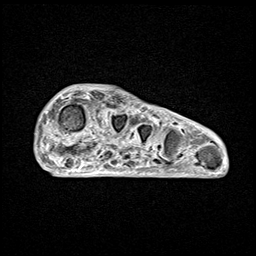
[im 27/45]
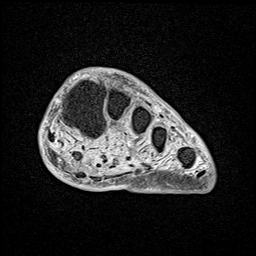
[im 36/45]
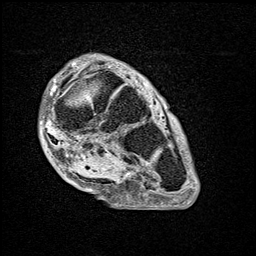
[im 45/45]
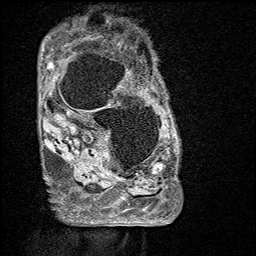

[Series 15: T1 fat-sat post-contrast · coronal · left · 3.0mm · 0.47mm/px · 6 of 45 slices shown]
[im 1/45]
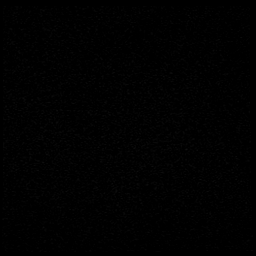
[im 9/45]
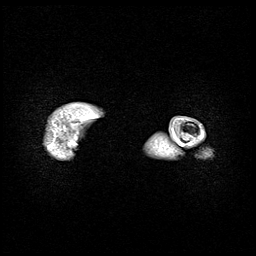
[im 18/45]
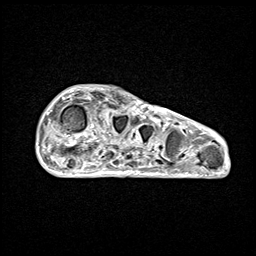
[im 27/45]
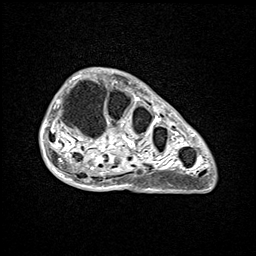
[im 36/45]
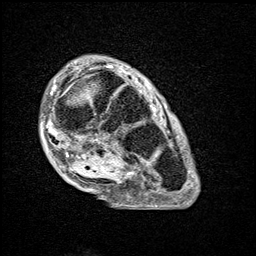
[im 45/45]
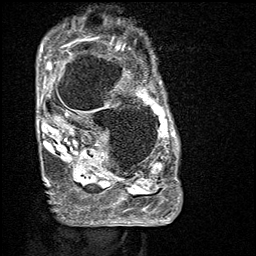

[Series 16: T1 fat-sat · sagittal · left · 3.0mm · 0.62mm/px · 4 of 28 slices shown (2 of 3)]
[im 1/28]
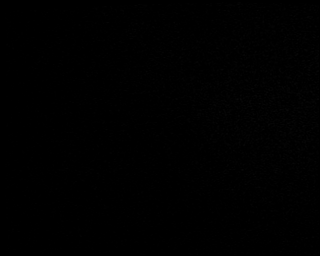
[im 10/28]
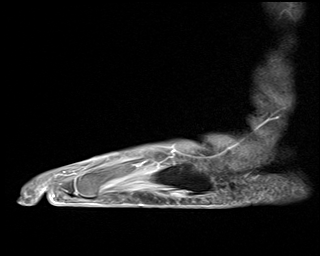
[im 19/28]
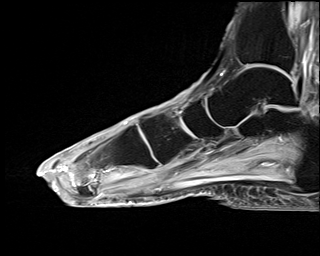
[im 28/28]
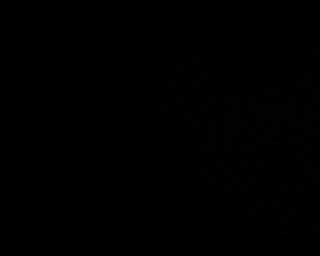

[Series 17: T1 fat-sat · axial · left · 3.0mm · 0.56mm/px · z∈[-69,+19]mm · 3 of 24 slices shown (3 of 3)]
[im 1/24]
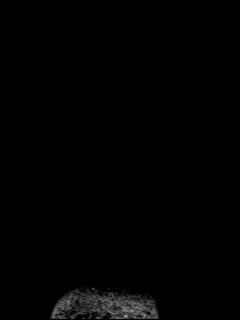
[im 12/24]
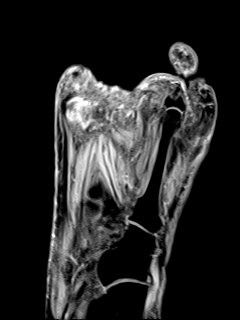
[im 24/24]
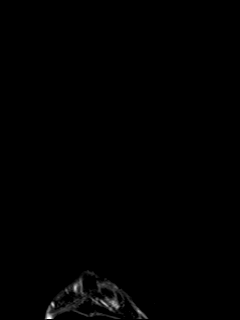

[40 of 40 positions shown; findings below may reference images not displayed]

FINDINGS: Bones/Joint/Cartilage

Postoperative change of amputation at the level of the head of the
first metatarsal again seen. There is edema and enhancement in the
distal 2.4 cm of the first metatarsal which is most intense in the
distal 1.1 cm of the first metatarsal. Intense edema and enhancement
are also seen in the medial and lateral sesamoids of the first MTP
joint.

The patient is status post amputation at the level of the neck of
the second metatarsal. There is minimal edema and enhancement at the
surgical site most consistent with postoperative change.

The third toe has been amputated. There is intense edema and
enhancement in the head and neck of the third metatarsal extending
approximately 1.6 cm in length. Bone marrow signal is otherwise
unremarkable. No joint effusion.

Ligaments

Negative.

Muscles and Tendons

No intramuscular fluid collection inflammatory change.

Soft tissues

Negative for abscess. Skin wound at the stump of the first
metatarsal head noted.
IMPRESSION: Intense edema in the distal 1.1 cm of the first metatarsal, medial
and lateral sesamoids of the first MTP joint and distal 1.6 cm of
the third metatarsal consistent with osteomyelitis. Milder degree of
edema extending approximately 1.3 cm in the distal diaphysis of the
first metatarsal could be due to early osteomyelitis or may reflect
reactive change.

Negative for septic joint or myositis.

## 2022-09-12 ENCOUNTER — Encounter: Payer: BC Managed Care – PPO | Admitting: *Deleted

## 2022-09-14 ENCOUNTER — Encounter: Payer: Self-pay | Admitting: *Deleted

## 2022-09-14 ENCOUNTER — Encounter: Payer: BC Managed Care – PPO | Admitting: *Deleted

## 2022-09-14 DIAGNOSIS — D860 Sarcoidosis of lung: Secondary | ICD-10-CM

## 2022-09-14 NOTE — Progress Notes (Signed)
Pulmonary Individual Treatment Plan  Patient Details  Name: Stacie [name] MRN: [id number] Date of Birth: May 31, 1956 Referring Provider:   Flowsheet Row Pulmonary Rehab from 07/25/2022 in [facility] Cardiac and Pulmonary Rehab  Referring Provider Ottie Glazier, MD       Initial Encounter Date:  Flowsheet Row Pulmonary Rehab from 07/25/2022 in [facility] Cardiac and Pulmonary Rehab  Date 07/25/22       Visit Diagnosis: Sarcoidosis of lung ([place])  Patient's Home Medications on Admission:  Current Outpatient Medications:    acetaminophen (TYLENOL) 500 MG tablet, Take 500 mg by mouth every 6 (six) hours as needed for moderate pain or headache., Disp: , Rfl:    denosumab (PROLIA) 60 MG/ML SOSY injection, Inject 60 mg into the skin every 6 (six) months., Disp: , Rfl:    dorzolamide (TRUSOPT) 2 % ophthalmic solution, Place 1 drop into both eyes 2 (two) times daily., Disp: , Rfl:    escitalopram (LEXAPRO) 20 MG tablet, Take 1 tablet (20 mg total) by mouth daily., Disp: 90 tablet, Rfl: 3   Fluticasone-Umeclidin-Vilant (TRELEGY ELLIPTA) 200-62.5-25 MCG/ACT AEPB, Inhale into the lungs., Disp: , Rfl:    latanoprost (XALATAN) 0.005 % ophthalmic solution, Place 1 drop into the right eye 2 (two) times daily., Disp: , Rfl:    levothyroxine (SYNTHROID) 137 MCG tablet, Take 1 tablet (137 mcg total) by mouth daily before breakfast., Disp: 90 tablet, Rfl: 3   Menthol (ICY HOT) 5 % PTCH, Apply 1 patch topically daily as needed (pain)., Disp: , Rfl:    Multiple Vitamins-Minerals (MULTIVITAMIN WITH MINERALS) tablet, Take 1 tablet by mouth daily., Disp: , Rfl:    naproxen sodium (ALEVE) 220 MG tablet, Take 220 mg by mouth daily as needed (pain)., Disp: , Rfl:    NON FORMULARY, CPAP nightly, Disp: , Rfl:    OXYGEN, Inhale 2 L into the lungs daily as needed (During walking and activity). , Disp: , Rfl:    predniSONE (DELTASONE) 10 MG tablet, Take 2 tablets (20 mg total) by mouth daily. Resume only after the finishing  new Rx for prednisone, Disp: , Rfl:    Probiotic Product (PROBIOTIC PO), Take 1 capsule by mouth daily., Disp: , Rfl:    Treprostinil (TYVASO) 0.6 MG/ML SOLN, Inhale into the lungs See admin instructions. 12 breaths 4 times a day, Disp: , Rfl:   Past Medical History: Past Medical History:  Diagnosis Date   Acute renal failure ([place])    Acute respiratory failure ([place]) 10/23/2021   Acute respiratory failure with hypoxia (HCC)    ARF (acute respiratory failure) ([place]) 10/19/2021   Arrhythmia    patient unaware if this is current   Asthma    Chronic kidney disease    Critical lower limb ischemia (HCC)    Depression    GERD (gastroesophageal reflux disease)    Heart murmur    History of kidney stones    HOH (hard of hearing)    wear aids   Hyperthyroidism    Hypothyroidism    IBS (irritable bowel syndrome)    Pneumonia    Pulmonary hypertension ([place])    Sarcoid    Sarcoidosis    Seasonal allergies    Sleep apnea CPAP with O2   Stroke ([place]) 07/2020   waters[place]   Wears hearing aid in both ears     Tobacco Use: Social History   Tobacco Use  Smoking Status Never  Smokeless Tobacco Never    Labs: Review Flowsheet  More data exists  Latest Ref Rng & Units 07/16/2020 07/21/2020 01/14/2021 10/19/2021 06/24/2022  Labs for ITP Cardiac and Pulmonary Rehab  Cholestrol 0 - 200 mg/dL - 289  - - -  LDL (calc) 0 - 99 mg/dL - 198  - - -  HDL-C >40 mg/dL - 33  - - -  Trlycerides <150 mg/dL - 290  - - -  Hemoglobin A1c 4.8 - 5.6 % 6.4  - - - 6.1   PH, Arterial 7.350 - 7.450 7.41  - - - -  PCO2 arterial 32.0 - 48.0 mmHg 52  - - - -  Bicarbonate 20.0 - 28.0 mmol/L 33.0  - - 31.7  -  TCO2 22 - 32 mmol/L - - 30  - -  O2 Saturation % 95.2  - - 33.6  -     Pulmonary Assessment Scores:  Pulmonary Assessment Scores     Row Name 07/25/22 1331 08/15/22 1643       ADL UCSD   ADL Phase -- Entry    SOB Score total -- 88    Rest -- 0    Walk -- 3    Stairs -- 5    Bath -- 5    Dress -- 5     Shop -- 5      CAT Score   CAT Score -- 21      mMRC Score   mMRC Score 4 --             UCSD: Self-administered rating of dyspnea associated with activities of daily living (ADLs) 6-point scale (0 = "not at all" to 5 = "maximal or unable to do because of breathlessness")  Scoring Scores range from 0 to 120.  Minimally important difference is 5 units  CAT: CAT can identify the health impairment of COPD patients and is better correlated with disease progression.  CAT has a scoring range of zero to 40. The CAT score is classified into four groups of low (less than 10), medium (10 - 20), high (21-30) and very high (31-40) based on the impact level of disease on health status. A CAT score over 10 suggests significant symptoms.  A worsening CAT score could be explained by an exacerbation, poor medication adherence, poor inhaler technique, or progression of COPD or comorbid conditions.  CAT MCID is 2 points  mMRC: mMRC (Modified Medical Research Council) Dyspnea Scale is used to assess the degree of baseline functional disability in patients of respiratory disease due to dyspnea. No minimal important difference is established. A decrease in score of 1 point or greater is considered a positive change.   Pulmonary Function Assessment:   Exercise Target Goals: Exercise Program Goal: Individual exercise prescription set using results from initial 6 min walk test and THRR while considering  patient's activity barriers and safety.   Exercise Prescription Goal: Initial exercise prescription builds to 30-45 minutes a day of aerobic activity, 2-3 days per week.  Home exercise guidelines will be given to patient during program as part of exercise prescription that the participant will acknowledge.  Education: Aerobic Exercise: - Group verbal and visual presentation on the components of exercise prescription. Introduces F.I.T.T principle from ACSM for exercise prescriptions.  Reviews  F.I.T.T. principles of aerobic exercise including progression. Written material given at graduation. Flowsheet Row Pulmonary Rehab from 08/10/2018 in [facility] Cardiac and Pulmonary Rehab  Date 05/02/18  Educator [facility]  Instruction Review Code 1- Verbalizes Understanding       Education: Resistance Exercise: - Group verbal  and visual presentation on the components of exercise prescription. Introduces F.I.T.T principle from ACSM for exercise prescriptions  Reviews F.I.T.T. principles of resistance exercise including progression. Written material given at graduation.    Education: Exercise & Equipment Safety: - Individual verbal instruction and demonstration of equipment use and safety with use of the equipment. Flowsheet Row Pulmonary Rehab from 08/10/2022 in [facility] Cardiac and Pulmonary Rehab  Date 07/25/22  Educator NT  Instruction Review Code 1- Verbalizes Understanding       Education: Exercise Physiology & General Exercise Guidelines: - Group verbal and written instruction with models to review the exercise physiology of the cardiovascular system and associated critical values. Provides general exercise guidelines with specific guidelines to those with heart or lung disease.  Flowsheet Row Pulmonary Rehab from 08/10/2018 in [facility] Cardiac and Pulmonary Rehab  Date 07/11/18  Educator [facility]  Instruction Review Code 1- Verbalizes Understanding       Education: Flexibility, Balance, Mind/Body Relaxation: - Group verbal and visual presentation with interactive activity on the components of exercise prescription. Introduces F.I.T.T principle from ACSM for exercise prescriptions. Reviews F.I.T.T. principles of flexibility and balance exercise training including progression. Also discusses the mind body connection.  Reviews various relaxation techniques to help reduce and manage stress (i.e. Deep breathing, progressive muscle relaxation, and visualization). Balance handout provided to take home.  Written material given at graduation. Flowsheet Row Pulmonary Rehab from 08/10/2018 in [facility] Cardiac and Pulmonary Rehab  Date 08/08/18  Educator AS  Instruction Review Code 1- Verbalizes Understanding       Activity Barriers & Risk Stratification:  Activity Barriers & Cardiac Risk Stratification - 07/25/22 1310       Activity Barriers & Cardiac Risk Stratification   Activity Barriers Other (comment);Shortness of Breath    Comments stroke 2021- left foot 3 toes amputated; broke elbow after tripping over a cord ("healed")             6 Minute Walk:  6 Minute Walk     Row Name 07/25/22 1306         6 Minute Walk   Phase Initial     Distance 705 feet     Walk Time 5.83 minutes     # of Rest Breaks 1     MPH 1.37     METS 2.25     RPE 15     Perceived Dyspnea  4     VO2 Peak 7.88     Symptoms Yes (comment)     Comments SOB     Resting HR 116 bpm     Resting BP 124/66     Resting Oxygen Saturation  91 %     Exercise Oxygen Saturation  during 6 min walk 84 %     Max Ex. HR 133 bpm     Max Ex. BP 140/80     2 Minute Post BP 128/72       Interval HR   1 Minute HR 116     2 Minute HR 127     3 Minute HR 129     4 Minute HR 127     5 Minute HR 133     6 Minute HR 128     2 Minute Post HR 119     Interval Heart Rate? Yes       Interval Oxygen   Interval Oxygen? Yes     Baseline Oxygen Saturation % 91 %     1 Minute Oxygen Saturation %  91 %     1 Minute Liters of Oxygen 5 L  Pulsed     2 Minute Oxygen Saturation % 90 %     2 Minute Liters of Oxygen 5 L  Pulsed     3 Minute Oxygen Saturation % 84 %     3 Minute Liters of Oxygen 5 L  Pulsed     4 Minute Oxygen Saturation % 84 %     4 Minute Liters of Oxygen 5 L  Pulsed     5 Minute Oxygen Saturation % 86 %     5 Minute Liters of Oxygen 5 L  Pulsed     6 Minute Oxygen Saturation % 85 %     6 Minute Liters of Oxygen 5 L  Pulsed     2 Minute Post Oxygen Saturation % 90 %     2 Minute Post Liters of Oxygen 5 L   Pulsed             Oxygen Initial Assessment:  Oxygen Initial Assessment - 07/12/22 1334       Home Oxygen   Home Oxygen Device Portable Concentrator;Home Concentrator    Sleep Oxygen Prescription CPAP    Liters per minute 2    Home Exercise Oxygen Prescription Continuous    Liters per minute 2    Home Resting Oxygen Prescription Continuous   Doctor told her she could use prn during rest   Liters per minute 2    Compliance with Home Oxygen Use Yes      Intervention   Short Term Goals To learn and exhibit compliance with exercise, home and travel O2 prescription;To learn and understand importance of monitoring SPO2 with pulse oximeter and demonstrate accurate use of the pulse oximeter.;To learn and understand importance of maintaining oxygen saturations>88%;To learn and demonstrate proper pursed lip breathing techniques or other breathing techniques. ;To learn and demonstrate proper use of respiratory medications    Long  Term Goals Exhibits compliance with exercise, home  and travel O2 prescription;Verbalizes importance of monitoring SPO2 with pulse oximeter and return demonstration;Maintenance of O2 saturations>88%;Exhibits proper breathing techniques, such as pursed lip breathing or other method taught during program session;Compliance with respiratory medication;Demonstrates proper use of MDI's             Oxygen Re-Evaluation:  Oxygen Re-Evaluation     Row Name 07/25/22 1015 08/29/22 1131           Program Oxygen Prescription   Program Oxygen Prescription -- Continuous      Liters per minute -- 2        Home Oxygen   Home Oxygen Device -- Portable Concentrator;Home Concentrator      Sleep Oxygen Prescription -- CPAP      Liters per minute -- 2      Home Exercise Oxygen Prescription -- Continuous      Liters per minute -- 2      Home Resting Oxygen Prescription -- Continuous      Liters per minute -- 2      Compliance with Home Oxygen Use -- Yes         Goals/Expected Outcomes   Short Term Goals -- To learn and exhibit compliance with exercise, home and travel O2 prescription;To learn and understand importance of monitoring SPO2 with pulse oximeter and demonstrate accurate use of the pulse oximeter.;To learn and understand importance of maintaining oxygen saturations>88%;To learn and demonstrate proper pursed lip breathing techniques or other breathing techniques. ;  To learn and demonstrate proper use of respiratory medications      Long  Term Goals -- Exhibits compliance with exercise, home  and travel O2 prescription;Verbalizes importance of monitoring SPO2 with pulse oximeter and return demonstration;Maintenance of O2 saturations>88%;Exhibits proper breathing techniques, such as pursed lip breathing or other method taught during program session;Compliance with respiratory medication;Demonstrates proper use of MDI's      Comments Reviewed PLB technique with pt.  Talked about how it works and it's importance in maintaining their exercise saturations. Reviewed PLB technique with pt.  Talked about how it works and it's importance in maintaining their exercise saturations. Stacie [name] has a pulse oximeter at home and has been checking her O2 saturations. When she walks at home she reports that she does experience desaturation but she pauses to let her oxygen come back to normal levels.      Goals/Expected Outcomes Short: Become more profiecient at using PLB.   Long: Become independent at using PLB. Short: Become more profiecient at using PLB.   Long: Become independent at using PLB.               Oxygen Discharge (Final Oxygen Re-Evaluation):  Oxygen Re-Evaluation - 08/29/22 1131       Program Oxygen Prescription   Program Oxygen Prescription Continuous    Liters per minute 2      Home Oxygen   Home Oxygen Device Portable Concentrator;Home Concentrator    Sleep Oxygen Prescription CPAP    Liters per minute 2    Home Exercise Oxygen Prescription  Continuous    Liters per minute 2    Home Resting Oxygen Prescription Continuous    Liters per minute 2    Compliance with Home Oxygen Use Yes      Goals/Expected Outcomes   Short Term Goals To learn and exhibit compliance with exercise, home and travel O2 prescription;To learn and understand importance of monitoring SPO2 with pulse oximeter and demonstrate accurate use of the pulse oximeter.;To learn and understand importance of maintaining oxygen saturations>88%;To learn and demonstrate proper pursed lip breathing techniques or other breathing techniques. ;To learn and demonstrate proper use of respiratory medications    Long  Term Goals Exhibits compliance with exercise, home  and travel O2 prescription;Verbalizes importance of monitoring SPO2 with pulse oximeter and return demonstration;Maintenance of O2 saturations>88%;Exhibits proper breathing techniques, such as pursed lip breathing or other method taught during program session;Compliance with respiratory medication;Demonstrates proper use of MDI's    Comments Reviewed PLB technique with pt.  Talked about how it works and it's importance in maintaining their exercise saturations. Stacie [name] has a pulse oximeter at home and has been checking her O2 saturations. When she walks at home she reports that she does experience desaturation but she pauses to let her oxygen come back to normal levels.    Goals/Expected Outcomes Short: Become more profiecient at using PLB.   Long: Become independent at using PLB.             Initial Exercise Prescription:  Initial Exercise Prescription - 07/27/22 1100       Intensity   THRR 40-80% of Max Heartrate 118 - 141   New   Ratings of Perceived Exertion 11-13    Perceived Dyspnea 0-4             Perform Capillary Blood Glucose checks as needed.  Exercise Prescription Changes:   Exercise Prescription Changes     Row Name 07/25/22 1300 07/27/22 1100 08/09/22 1400 08/22/22 1100  Response to  Exercise   Blood Pressure (Admit) 124/66 -- 120/62 130/70    Blood Pressure (Exercise) 140/80 -- 138/76 138/62    Blood Pressure (Exit) 128/72 -- 140/82 134/72    Heart Rate (Admit) 116 bpm -- 97 bpm 96 bpm    Heart Rate (Exercise) 133 bpm -- 127 bpm 119 bpm    Heart Rate (Exit) 119 bpm -- 109 bpm 104 bpm    Oxygen Saturation (Admit) 91 % -- 96 % 95 %    Oxygen Saturation (Exercise) 84 % -- 89 % 91 %    Oxygen Saturation (Exit) 90 % -- 96 % 95 %    Rating of Perceived Exertion (Exercise) 14 -- 13 13    Perceived Dyspnea (Exercise) 4 -- 2 4    Symptoms SOB -- SOB SOB    Comments 6MWT Results -- First 3 sessions in rehab --    Duration -- -- Continue with 30 min of aerobic exercise without signs/symptoms of physical distress. Continue with 30 min of aerobic exercise without signs/symptoms of physical distress.    Intensity -- THRR New  Changed 118-141 THRR unchanged THRR unchanged      Progression   Progression -- -- Continue to progress workloads to maintain intensity without signs/symptoms of physical distress. Continue to progress workloads to maintain intensity without signs/symptoms of physical distress.    Average METs -- -- 1.9 2.07      Resistance Training   Training Prescription -- -- Yes Yes    Weight -- -- 3 lb 3 lb    Reps -- -- 10-15 10-15      Interval Training   Interval Training -- -- No No      Oxygen   Oxygen -- -- Continuous Continuous    Liters -- -- 3 3      Treadmill   MPH -- 1.5 1.1 1.2    Grade -- 0 0 0    Minutes -- _0 METs -- 2.15 1.84 1.92      NuStep   Level -- -- -- 1    Minutes -- -- -- 15    METs -- -- -- 2      REL-XR   Level -- -- 1 1    Minutes -- -- 15 15    METs -- -- 2.5 2.9      T5 Nustep   Level -- -- 1 1    Minutes -- -- 15 15    METs -- -- 1.6 2      Oxygen   Maintain Oxygen Saturation -- -- 88% or higher 88% or higher             Exercise Comments:   Exercise Comments     Row Name 07/25/22 1012            Exercise Comments First full day of exercise!  Patient was oriented to gym and equipment including functions, settings, policies, and procedures.  Patient's individual exercise prescription and treatment plan were reviewed.  All starting workloads were established based on the results of the 6 minute walk test done at initial orientation visit.  The plan for exercise progression was also introduced and progression will be customized based on patient's performance and goals.                Exercise Goals and Review:   Exercise Goals     Row Name 07/25/22 1330  Exercise Goals   Increase Physical Activity Yes       Intervention Provide advice, education, support and counseling about physical activity/exercise needs.;Develop an individualized exercise prescription for aerobic and resistive training based on initial evaluation findings, risk stratification, comorbidities and participant's personal goals.       Expected Outcomes Short Term: Attend rehab on a regular basis to increase amount of physical activity.;Long Term: Add in home exercise to make exercise part of routine and to increase amount of physical activity.;Long Term: Exercising regularly at least 3-5 days a week.       Increase Strength and Stamina Yes       Intervention Provide advice, education, support and counseling about physical activity/exercise needs.;Develop an individualized exercise prescription for aerobic and resistive training based on initial evaluation findings, risk stratification, comorbidities and participant's personal goals.       Expected Outcomes Short Term: Increase workloads from initial exercise prescription for resistance, speed, and METs.;Short Term: Perform resistance training exercises routinely during rehab and add in resistance training at home;Long Term: Improve cardiorespiratory fitness, muscular endurance and strength as measured by increased METs and functional capacity (6MWT)        Able to understand and use rate of perceived exertion (RPE) scale Yes       Intervention Provide education and explanation on how to use RPE scale       Expected Outcomes Short Term: Able to use RPE daily in rehab to express subjective intensity level;Long Term:  Able to use RPE to guide intensity level when exercising independently       Able to understand and use Dyspnea scale Yes       Intervention Provide education and explanation on how to use Dyspnea scale       Expected Outcomes Short Term: Able to use Dyspnea scale daily in rehab to express subjective sense of shortness of breath during exertion;Long Term: Able to use Dyspnea scale to guide intensity level when exercising independently       Knowledge and understanding of Target Heart Rate Range (THRR) Yes       Intervention Provide education and explanation of THRR including how the numbers were predicted and where they are located for reference       Expected Outcomes Short Term: Able to state/look up THRR;Short Term: Able to use daily as guideline for intensity in rehab;Long Term: Able to use THRR to govern intensity when exercising independently       Able to check pulse independently Yes       Intervention Provide education and demonstration on how to check pulse in carotid and radial arteries.;Review the importance of being able to check your own pulse for safety during independent exercise       Expected Outcomes Short Term: Able to explain why pulse checking is important during independent exercise;Long Term: Able to check pulse independently and accurately       Understanding of Exercise Prescription Yes       Intervention Provide education, explanation, and written materials on patient's individual exercise prescription       Expected Outcomes Short Term: Able to explain program exercise prescription;Long Term: Able to explain home exercise prescription to exercise independently                Exercise Goals Re-Evaluation  :  Exercise Goals Re-Evaluation     Row Name 07/25/22 1012 08/08/22 1108 08/09/22 1419 08/22/22 1200 08/29/22 1115     Exercise Goal Re-Evaluation  Exercise Goals Review Able to understand and use rate of perceived exertion (RPE) scale;Able to understand and use Dyspnea scale;Knowledge and understanding of Target Heart Rate Range (THRR);Understanding of Exercise Prescription Understanding of Exercise Prescription;Increase Physical Activity;Increase Strength and Stamina Understanding of Exercise Prescription;Increase Physical Activity;Increase Strength and Stamina Understanding of Exercise Prescription;Increase Physical Activity;Increase Strength and Stamina Understanding of Exercise Prescription;Increase Physical Activity;Increase Strength and Stamina   Comments Reviewed RPE scale, THR and program prescription with pt today.  Pt voiced understanding and was given a copy of goals to take home. Stacie [name] feels that she is doing well with exercise. She states that she feels stronger since starting the program. She stated that she has not seen a huge benefit in her breathing but she wants to continue to work towards that goal. She does also state that she felt over exerted last week, but states it could have been due to being ill. We will continue to monitor her progress in the program. Stacie [name] is doing well in rehab. Her average overall MET level after her first 3 sessions was 1.9 METs. She also has done well walking on the treadmill at 1.1 mph without desaturating. She has tolerated 3 lb hand weights for resistance training as well. We will continue to monitor her progress in the program. Stacie [name] continues to do well in rehab. She was able to get her speed on the treadmill to 1.2 mph, though walking is challenging for her. She has not increased her level on other seated machines but all are reflecting appropriate RPEs. She should work toward increasing to level 2 on the T4 Nustep. Oxygen saturations are staying above  88%. Will continue to monitor. Stacie [name] is doing well in rehab. She states that she has more energy since starting the program. She has been walking on the treadmill at a speed of 1.2 mph, but has had some trouble with her oxygen saturation dropping below 88. Stacie [name] has been doing some walking at home  about 2-3 times a week as well, and feels she has seen some improvement there as well. We will continue to monitor her progress in the program.   Expected Outcomes Short: Use RPE daily to regulate intensity.  Long: Follow program prescription in THR. Short: Continue to follow current exercise prescription. Long: Continue to increase strength and stamina. Short: Continue to increase workloads as tolerated. Long: Continue to increase strength and stamina. Short: Increase to level 2 on T4 Nustep. Long: Continue to increase overall MET level Short: Continue to walk at home on days away from rehab. Long: Continue to increase strength and stamina.            Discharge Exercise Prescription (Final Exercise Prescription Changes):  Exercise Prescription Changes - 08/22/22 1100       Response to Exercise   Blood Pressure (Admit) 130/70    Blood Pressure (Exercise) 138/62    Blood Pressure (Exit) 134/72    Heart Rate (Admit) 96 bpm    Heart Rate (Exercise) 119 bpm    Heart Rate (Exit) 104 bpm    Oxygen Saturation (Admit) 95 %    Oxygen Saturation (Exercise) 91 %    Oxygen Saturation (Exit) 95 %    Rating of Perceived Exertion (Exercise) 13    Perceived Dyspnea (Exercise) 4    Symptoms SOB    Duration Continue with 30 min of aerobic exercise without signs/symptoms of physical distress.    Intensity THRR unchanged      Progression   Progression Continue to  progress workloads to maintain intensity without signs/symptoms of physical distress.    Average METs 2.07      Resistance Training   Training Prescription Yes    Weight 3 lb    Reps 10-15      Interval Training   Interval Training No       Oxygen   Oxygen Continuous    Liters 3      Treadmill   MPH 1.2    Grade 0    Minutes 15    METs 1.92      NuStep   Level 1    Minutes 15    METs 2      REL-XR   Level 1    Minutes 15    METs 2.9      T5 Nustep   Level 1    Minutes 15    METs 2      Oxygen   Maintain Oxygen Saturation 88% or higher             Nutrition:  Target Goals: Understanding of nutrition guidelines, daily intake of sodium <1576m, cholesterol <2037m calories 30% from fat and 7% or less from saturated fats, daily to have 5 or more servings of fruits and vegetables.  Education: All About Nutrition: -Group instruction provided by verbal, written material, interactive activities, discussions, models, and posters to present general guidelines for heart healthy nutrition including fat, fiber, MyPlate, the role of sodium in heart healthy nutrition, utilization of the nutrition label, and utilization of this knowledge for meal planning. Follow up email sent as well. Written material given at graduation. [facility]  Date 06/27/18  Educator LB  Instruction Review Code 1- Verbalizes Understanding       Biometrics:  Pre Biometrics - 07/25/22 1330       Pre Biometrics   Height _0  (1.676 m)    Weight 188 lb 9.6 oz (85.5 kg)    Waist Circumference 37 inches    Hip Circumference 44 inches    Waist to Hip Ratio 0.84 %    BMI (Calculated) 30.46              Nutrition Therapy Plan and Nutrition Goals:  Nutrition Therapy & Goals - 07/20/22 1517       Nutrition Therapy   Diet Heart healthy, low Na, pulmonary MNT    Protein (specify units) 90-95g    Fiber 27 grams    Whole Grain Foods 3 servings    Saturated Fats 15 max. grams    Fruits and Vegetables 8 servings/day    Sodium 1.5 grams      Personal Nutrition Goals   Nutrition Goal ST: practice MyPlate guidelines (adding in 1-2 additional servings of  non-starchy vegetables), be sure to include at least 1 good source of protein at meals and snacks, limit sodium by reading food labels and using other ways to flavor food LT: limit Na <1.5g/day, have at least 5 servings of vegetables/day    Comments [age].o. F admitted to pulmonary rehab for sarcoidosis of lung. PMHx includes hypothyroidism, HLD, CKD stg 3, asthma, HTN, OSA, elevated BG (A1C 6.4), anxiety. Relevant medications includes lexapro, synthroid, MVI, prednisone, probiotic, tyvaso, trilogy. B: healthy waffles occasionally. She will have eggs with sausage. S: cheese and crackers or yogurt (greek yogurt) L: egg salad or tuna salad with lettuce, rye bread. S: cheese and crackers or yogurt (greek yogurt) D: protein (meat  or chicken) with a salad and more starchy vegetables. S: sometimes low calorie ice cream bar. She loves fruit and will have it with most meals. She limits dairy as this can make her not feel great. She will use avocado oil and coconut aminos mostly, she does also use "no salt" which replaces sodium chloride with potassium choride. Drinks: Coffee (sometimes), water. She reports that her daughter is health conscious and has taken care of her for about 2 years - she is now going back to work. Discussed general heart healthy eating, sodium, and pulmonary MNT. Also reviewed ways to maintain a healthy BG. Advised to stop using the "no salt" and to decrease the salt in her processed foods as well as adding other ways to flavor her food. Since her protein needs are higher, suggested she have at least one good source of protein at each meal.      Intervention Plan   Intervention Prescribe, educate and counsel regarding individualized specific dietary modifications aiming towards targeted core components such as weight, hypertension, lipid management, diabetes, heart failure and other comorbidities.    Expected Outcomes Short Term Goal: Understand basic principles of dietary content, such as  calories, fat, sodium, cholesterol and nutrients.;Short Term Goal: A plan has been developed with personal nutrition goals set during dietitian appointment.;Long Term Goal: Adherence to prescribed nutrition plan.             Nutrition Assessments:  MEDIFICTS Score Key: ?70 Need to make dietary changes  40-70 Heart Healthy Diet ? 40 Therapeutic Level Cholesterol Diet  Flowsheet Row Pulmonary Rehab from 08/15/2022 in [facility] and Pulmonary Rehab  Picture Your Plate Total Score on Admission 58      Picture Your Plate Scores: <94 Unhealthy dietary pattern with much room for improvement. 41-50 Dietary pattern unlikely to meet recommendations for good health and room for improvement. 51-60 More healthful dietary pattern, with some room for improvement.  >60 Healthy dietary pattern, although there may be some specific behaviors that could be improved.   Nutrition Goals Re-Evaluation:  Nutrition Goals Re-Evaluation     [place] Name 08/08/22 1113 08/29/22 1124           Goals   Nutrition Goal ST: practice MyPlate guidelines (adding in 1-2 additional servings of non-starchy vegetables), be sure to include at least 1 good source of protein at meals and snacks, limit sodium by reading food labels and using other ways to flavor food LT: limit Na <1.5g/day, have at least 5 servings of vegetables/day --      Comment She has been making smoothies regularly and has been adding more vegetables than fruit to them to help increase her intake. Including fruits and vegetables daily. She continues to read food labels regularly and feels that she has been able to maintain new healthier habits --      Expected Outcome Short: She will continue to eat fruits and vegetables daily, and to read food labels. Long: Continue to follow healthy eating patterns discussed with RD. Short: She will continue to eat fruits and vegetables daily, and to read food labels. Long: Continue to follow healthy eating patterns  discussed with RD.        Personal Goal #2 Re-Evaluation   Personal Goal #2 -- Continue to educate yourself on foods high in sodium and added sugar. Look for foods with less than ~10g added sugar per serving and less than ~25m sodium per serving  Nutrition Goals Discharge (Final Nutrition Goals Re-Evaluation):  Nutrition Goals Re-Evaluation - 08/29/22 1124       Goals   Expected Outcome Short: She will continue to eat fruits and vegetables daily, and to read food labels. Long: Continue to follow healthy eating patterns discussed with RD.      Personal Goal #2 Re-Evaluation   Personal Goal #2 Continue to educate yourself on foods high in sodium and added sugar. Look for foods with less than ~10g added sugar per serving and less than ~219m sodium per serving             Psychosocial: Target Goals: Acknowledge presence or absence of significant depression and/or stress, maximize coping skills, provide positive support system. Participant is able to verbalize types and ability to use techniques and skills needed for reducing stress and depression.   Education: Stress, Anxiety, and Depression - Group verbal and visual presentation to define topics covered.  Reviews how body is impacted by stress, anxiety, and depression.  Also discusses healthy ways to reduce stress and to treat/manage anxiety and depression.  Written material given at graduation. [facility]  Date 08/10/22  [facility] Review Code 1- VUnited States Steel CorporationUnderstanding       Education: Sleep Hygiene -Provides group verbal and written instruction about how sleep can affect your health.  Define sleep hygiene, discuss sleep cycles and impact of sleep habits. Review good sleep hygiene tips.  [facility]  Date 04/11/18  [facility] Review Code 1-  Verbalizes Understanding       Initial Review & Psychosocial Screening:  Initial Psych Review & Screening - 07/12/22 1339       Initial Review   Current issues with Current Stress Concerns    Source of Stress Concerns Chronic Illness      Family Dynamics   Good Support System? Yes   family and friends     Barriers   Psychosocial barriers to participate in program There are no identifiable barriers or psychosocial needs.;The patient should benefit from training in stress management and relaxation.      Screening Interventions   Interventions Encouraged to exercise;Provide feedback about the scores to participant;To provide support and resources with identified psychosocial needs    Expected Outcomes Short Term goal: Utilizing psychosocial counselor, staff and physician to assist with identification of specific Stressors or current issues interfering with healing process. Setting desired goal for each stressor or current issue identified.;Long Term Goal: Stressors or current issues are controlled or eliminated.;Short Term goal: Identification and review with participant of any Quality of Life or Depression concerns found by scoring the questionnaire.;Long Term goal: The participant improves quality of Life and PHQ9 Scores as seen by post scores and/or verbalization of changes             Quality of Life Scores:  Scores of 19 and below usually indicate a poorer quality of life in these areas.  A difference of  2-3 points is a clinically meaningful difference.  A difference of 2-3 points in the total score of the Quality of Life Index has been associated with significant improvement in overall quality of life, self-image, physical symptoms, and general health in studies assessing change in quality of life.  PHQ-9: Review Flowsheet  More data exists      07/20/2022 06/24/2022 10/13/2021 09/28/2021 09/21/2020  Depression screen PHQ 2/9  Decreased Interest 0 0 0 0 0  Down, Depressed,  Hopeless 0 0 0 0 0  PHQ - 2 Score 0 0 0 0 0  Altered sleeping 1 0 0 0 1  Tired, decreased energy 1 0 0 0 1  Change in appetite 0 0 0 0 0  Feeling bad or failure about yourself  0 0 0 0 0  Trouble concentrating 1 0 0 0 0  Moving slowly or fidgety/restless 0 0 0 0 0  Suicidal thoughts 0 0 0 0 0  PHQ-9 Score 3 0 0 0 2  Difficult doing work/chores Not difficult at all Not difficult at all Not difficult at all Not difficult at all Not difficult at all   Interpretation of Total Score  Total Score Depression Severity:  1-4 = Minimal depression, 5-9 = Mild depression, 10-14 = Moderate depression, 15-19 = Moderately severe depression, 20-27 = Severe depression   Psychosocial Evaluation and Intervention:  Psychosocial Evaluation - 07/12/22 1352       Psychosocial Evaluation & Interventions   Interventions Encouraged to exercise with the program and follow exercise prescription;Relaxation education    Comments Stacie [name] is returning to pulmonary rehab by request. She had completed the program in 2019 and after having a stroke and increased breathing issues, she asked to come back. She wants to be able to play with her 11 and soon to be 12 grandkids, and states she knows her breathing may not get better but she wants to feel better.  Her strength and stamina are also things she wants to work on. She does not report any sleep concerns. She reports that sometimes she may feel down about all the medical things that have happened recently, but she doesn't stay there long because of her amazing support system.    Expected Outcomes SHort: attend pulmonary rehab for educaiton and exercise. Long: develop and maintain positive self care habits.    Continue Psychosocial Services  Follow up required by staff             Psychosocial Re-Evaluation:  Psychosocial Re-Evaluation     [place] Name 07/28/22 1130 08/29/22 1120           Psychosocial Re-Evaluation   Current issues with Current Psychotropic  Meds;Current Stress Concerns Current Psychotropic Meds;Current Stress Concerns      Comments Stacie [name] states that she is stressed due to her stroke and hear breathing issues. He has a positive outlook on improving her health. Her breathing has got worse over the last couple years because she was not able to exercise much. Stacie [name] denies any major stressors at this time. She states that she has a good support system at home made up mostly by her husband and children. For stress relief she likes to read and study her Bible and play with her grandchildren. She also feels that exercise has been a good stress releiver for her as well.      Expected Outcomes Short: Start [name] to help with mood. Long: Maintain a healthy mental state Short: Continue [name] to help with mood. Long: Maintain a positive outlook.      Interventions Relaxation education;Stress management education;Encouraged to attend Pulmonary Rehabilitation for the exercise Relaxation education;Stress management education;Encouraged to attend Pulmonary Rehabilitation for the exercise      Continue Psychosocial Services  Follow up required by staff Follow up required by staff               Psychosocial Discharge (Final Psychosocial Re-Evaluation):  Psychosocial  Re-Evaluation - 08/29/22 1120       Psychosocial Re-Evaluation   Current issues with Current Psychotropic Meds;Current Stress Concerns    Comments Stacie [name] denies any major stressors at this time. She states that she has a good support system at home made up mostly by her husband and children. For stress relief she likes to read and study her Bible and play with her grandchildren. She also feels that exercise has been a good stress releiver for her as well.    Expected Outcomes Short: Continue [name] to help with mood. Long: Maintain a positive outlook.    Interventions Relaxation education;Stress management education;Encouraged to attend Pulmonary Rehabilitation for the exercise     Continue Psychosocial Services  Follow up required by staff             Education: Education Goals: Education classes will be provided on a weekly basis, covering required topics. Participant will state understanding/return demonstration of topics presented.  Learning Barriers/Preferences:   General Pulmonary Education Topics:  Infection Prevention: - Provides verbal and written material to individual with discussion of infection control including proper hand washing and proper equipment cleaning during exercise session. Flowsheet Row Pulmonary Rehab from 08/10/2022 in [facility] Cardiac and Pulmonary Rehab  Date 07/27/22  Educator NT  Instruction Review Code 1- Verbalizes Understanding       Falls Prevention: - Provides verbal and written material to individual with discussion of falls prevention and safety. Flowsheet Row Pulmonary Rehab from 08/10/2022 in [facility] and Pulmonary Rehab  Date 07/27/22  Educator NT  Instruction Review Code 1- Verbalizes Understanding       Chronic Lung Disease Review: - Group verbal instruction with posters, models, PowerPoint presentations and videos,  to review new updates, new respiratory medications, new advancements in procedures and treatments. Providing information on websites and "800" numbers for continued self-education. Includes information about supplement oxygen, available portable oxygen systems, continuous and intermittent flow rates, oxygen safety, concentrators, and Medicare reimbursement for oxygen. Explanation of Pulmonary Drugs, including class, frequency, complications, importance of spacers, rinsing mouth after steroid MDI's, and proper cleaning methods for nebulizers. Review of basic lung anatomy and physiology related to function, structure, and complications of lung disease. Review of risk factors. Discussion about methods for diagnosing sleep apnea and types of masks and machines for OSA. Includes a review of the use of  types of environmental controls: home humidity, furnaces, filters, dust mite/pet prevention, HEPA vacuums. Discussion about weather changes, air quality and the benefits of nasal washing. Instruction on Warning signs, infection symptoms, calling MD promptly, preventive modes, and value of vaccinations. Review of effective airway clearance, coughing and/or vibration techniques. Emphasizing that all should Create an Action Plan. Written material given at graduation. Flowsheet Row Pulmonary Rehab from 08/10/2018 in [facility] Cardiac and Pulmonary Rehab  Date 06/29/18  Educator [facility]  Instruction Review Code 1- Verbalizes Understanding       AED/CPR: - Group verbal and written instruction with the use of models to demonstrate the basic use of the AED with the basic ABC's of resuscitation.    Anatomy and Cardiac Procedures: - Group verbal and visual presentation and models provide information about basic cardiac anatomy and function. Reviews the testing methods done to diagnose heart disease and the outcomes of the test results. Describes the treatment choices: Medical Management, Angioplasty, or Coronary Bypass Surgery for treating various heart conditions including Myocardial Infarction, Angina, Valve Disease, and Cardiac Arrhythmias.  Written material given at graduation. Flowsheet Row Pulmonary Rehab from 08/10/2018  in [facility] Cardiac and Pulmonary Rehab  Date 05/16/18  Educator [facility]  Instruction Review Code 5- Refused Teaching       Medication Safety: - Group verbal and visual instruction to review commonly prescribed medications for heart and lung disease. Reviews the medication, class of the drug, and side effects. Includes the steps to properly store meds and maintain the prescription regimen.  Written material given at graduation. Flowsheet Row Pulmonary Rehab from 08/10/2018 in [facility] Cardiac and Pulmonary Rehab  Date 08/10/18  Educator [facility]  Instruction Review Code 1- Verbalizes Understanding        Other: -Provides group and verbal instruction on various topics (see comments)   Knowledge Questionnaire Score:  Knowledge Questionnaire Score - 08/15/22 1642       Knowledge Questionnaire Score   Pre Score 11/18              Core Components/Risk Factors/Patient Goals at Admission:  Personal Goals and Risk Factors at Admission - 07/12/22 1337       Core Components/Risk Factors/Patient Goals on Admission    Weight Management --    Intervention --    Expected Outcomes --    Improve shortness of breath with ADL's Yes    Intervention Provide education, individualized exercise plan and daily activity instruction to help decrease symptoms of SOB with activities of daily living.    Expected Outcomes Short Term: Improve cardiorespiratory fitness to achieve a reduction of symptoms when performing ADLs;Long Term: Be able to perform more ADLs without symptoms or delay the onset of symptoms    Hypertension Yes    Intervention Provide education on lifestyle modifcations including regular physical activity/exercise, weight management, moderate sodium restriction and increased consumption of fresh fruit, vegetables, and low fat dairy, alcohol moderation, and smoking cessation.;Monitor prescription use compliance.    Expected Outcomes Short Term: Continued assessment and intervention until BP is < 140/86m HG in hypertensive participants. < 130/846mHG in hypertensive participants with diabetes, heart failure or chronic kidney disease.;Long Term: Maintenance of blood pressure at goal levels.    Lipids Yes    Intervention Provide education and support for participant on nutrition & aerobic/resistive exercise along with prescribed medications to achieve LDL <7064mHDL >67m81m  Expected Outcomes Short Term: Participant states understanding of desired cholesterol values and is compliant with medications prescribed. Participant is following exercise prescription and nutrition guidelines.;Long  Term: Cholesterol controlled with medications as prescribed, with individualized exercise RX and with personalized nutrition plan. Value goals: LDL < 70mg21mL > 40 mg.             Education:Diabetes - Individual verbal and written instruction to review signs/symptoms of diabetes, desired ranges of glucose level fasting, after meals and with exercise. Acknowledge that pre and post exercise glucose checks will be done for 3 sessions at entry of program.   Know Your Numbers and Heart Failure: - Group verbal and visual instruction to discuss disease risk factors for cardiac and pulmonary disease and treatment options.  Reviews associated critical values for Overweight/Obesity, Hypertension, Cholesterol, and Diabetes.  Discusses basics of heart failure: signs/symptoms and treatments.  Introduces Heart Failure Zone chart for action plan for heart failure.  Written material given at graduation. Flowsheet Row [facility]  Date 07/27/22  Educator SB  Instruction Review Code 1- Verbalizes Understanding       Core Components/Risk Factors/Patient Goals Review:   Goals and Risk Factor Review  [place] Name 07/28/22 1127 08/08/22 1114 08/29/22 1125         Core Components/Risk Factors/Patient Goals Review   Personal Goals Review Improve shortness of breath with ADL's Improve shortness of breath with ADL's Improve shortness of breath with ADL's;Weight Management/Obesity     Review Spoke to patient about their shortness of breath and what they can do to improve. Patient has been informed of breathing techniques when starting the program. Patient is informed to tell staff if they have had any med changes and that certain meds they are taking or not taking can be causing shortness of breath. Spoke to patient about their shortness of breath and what they can do to improve. Patient has been informed of breathing techniques when starting the program.  Patient is informed to tell staff if they have had any med changes and that certain meds they are taking or not taking can be causing shortness of breath. Stacie [name] states that she would like to lose a little bit of weight with a goal of 165-170 lbs. She also has been monitoring her O2 saturations at home and reports that they are staying above 95. She also states that she has experienced very little SOB with her ADL's. Stacie [name] has not been checking her BP at home but does have a BP cuff and was encouraged to start monitoring it at home.     Expected Outcomes Short: Attend [name] regularly to improve shortness of breath with ADL's. Long: maintain independence with ADL's Short: Attend [name] regularly to improve shortness of breath with ADL's. Long: maintain independence with ADL's Short: Attend [name] regularly to improve shortness of breath with ADL's. Long: Continue to monitor lifestyle risk factors.              Core Components/Risk Factors/Patient Goals at Discharge (Final Review):   Goals and Risk Factor Review - 08/29/22 1125       Core Components/Risk Factors/Patient Goals Review   Personal Goals Review Improve shortness of breath with ADL's;Weight Management/Obesity    Review Stacie [name] states that she would like to lose a little bit of weight with a goal of 165-170 lbs. She also has been monitoring her O2 saturations at home and reports that they are staying above 95. She also states that she has experienced very little SOB with her ADL's. Stacie [name] has not been checking her BP at home but does have a BP cuff and was encouraged to start monitoring it at home.    Expected Outcomes Short: Attend [name] regularly to improve shortness of breath with ADL's. Long: Continue to monitor lifestyle risk factors.             ITP Comments:  ITP Comments     Row Name 07/12/22 1346 07/20/22 1546 07/25/22 1012 08/17/22 0942 09/14/22 0810   ITP Comments Initial telephone orientation completed.  Diagnosis can be found in [facility] 8/16. EP orientation scheduled for Wednesday 10/4 at 2:30. Patient presented for orientation today, 10/4. Patient wore open-toed shoes and therefore could not complete 6MWT. Will do 6MWT on Monday, 10/9. First full day of exercise!  Patient was oriented to gym and equipment including functions, settings, policies, and procedures.  Patient's individual exercise prescription and treatment plan were reviewed.  All starting workloads were established based on the results of the 6 minute walk test done at initial orientation visit.  The plan for exercise progression was also introduced and progression will be customized based on patient's performance and goals. 30 Day review completed. Medical  Director ITP review done, changes made as directed, and signed approval by Market researcher.   NEW TO PROGRAM 30 Day review completed. Medical Director ITP review done, changes made as directed, and signed approval by Medical Director.            Comments:

## 2022-09-15 ENCOUNTER — Encounter: Payer: BC Managed Care – PPO | Admitting: *Deleted

## 2022-09-15 DIAGNOSIS — D860 Sarcoidosis of lung: Secondary | ICD-10-CM

## 2022-09-15 NOTE — Progress Notes (Signed)
Daily Session Note  Patient Details  Name: Stacie [name] MRN: [id number] Date of Birth: 28-Sep-1956 Referring Provider:   Flowsheet Row Pulmonary Rehab from 07/25/2022 in [facility] Cardiac and Pulmonary Rehab  Referring Provider Ottie Glazier, MD       Encounter Date: 09/15/2022  Check In:  Session Check In - 09/15/22 1150       Check-In   Supervising physician immediately available to respond to emergencies See telemetry face sheet for immediately available ER MD    Location [facility]-Cardiac & Pulmonary Rehab    Staff Present Heath Lark, RN, BSN, CCRP;Noah Tickle, BS, Exercise Physiologist;Meredith Sherryll Burger, RN BSN;Joseph Tessie Fass, RCP,RRT,BSRT    Virtual Visit No    Medication changes reported     No    Fall or balance concerns reported    No    Warm-up and Cool-down Performed on first and last piece of equipment    Resistance Training Performed Yes    VAD Patient? No    PAD/SET Patient? No      Pain Assessment   Currently in Pain? No/denies                Social History   Tobacco Use  Smoking Status Never  Smokeless Tobacco Never    Goals Met:  Proper associated with RPD/PD & O2 Sat Independence with exercise equipment Exercise tolerated well No report of concerns or symptoms today  Goals Unmet:  Not Applicable  Comments: Pt able to follow exercise prescription today without complaint.  Will continue to monitor for progression.    Dr. Emily Filbert is Medical Director for [place].  Dr. Ottie Glazier is Medical Director for [facility] Pulmonary Rehabilitation.

## 2022-09-18 DIAGNOSIS — I10 Essential (primary) hypertension: Secondary | ICD-10-CM | POA: Diagnosis not present

## 2022-09-18 DIAGNOSIS — G4733 Obstructive sleep apnea (adult) (pediatric): Secondary | ICD-10-CM | POA: Diagnosis not present

## 2022-09-19 ENCOUNTER — Encounter: Payer: BC Managed Care – PPO | Attending: Pulmonary Disease | Admitting: *Deleted

## 2022-09-19 DIAGNOSIS — D860 Sarcoidosis of lung: Secondary | ICD-10-CM | POA: Diagnosis not present

## 2022-09-19 NOTE — Progress Notes (Signed)
Daily Session Note  Patient Details  Name: Stacie [name] MRN: [id number] Date of Birth: Jul 28, 1956 Referring Provider:   Flowsheet Row Pulmonary Rehab from 07/25/2022 in [facility] Cardiac and Pulmonary Rehab  Referring Provider Ottie Glazier, MD       Encounter Date: 09/19/2022  Check In:  Session Check In - 09/19/22 1115       Check-In   Supervising physician immediately available to respond to emergencies See telemetry face sheet for immediately available ER MD    Location [facility]-Cardiac & Pulmonary Rehab    Staff Present Darlyne Russian, RN, Doyce Para, BS, ACSM CEP, Exercise Physiologist;Meredith Sherryll Burger, RN BSN;Noah Tickle, BS, Exercise Physiologist    Virtual Visit No    Medication changes reported     No    Fall or balance concerns reported    No    Warm-up and Cool-down Performed on first and last piece of equipment    Resistance Training Performed Yes    VAD Patient? No    PAD/SET Patient? No      Pain Assessment   Currently in Pain? No/denies                Social History   Tobacco Use  Smoking Status Never  Smokeless Tobacco Never    Goals Met:  Independence with exercise equipment Exercise tolerated well No report of concerns or symptoms today Strength training completed today  Goals Unmet:  Not Applicable  Comments: Pt able to follow exercise prescription today without complaint.  Will continue to monitor for progression.    Dr. Emily Filbert is Medical Director for [place].  Dr. Ottie Glazier is Medical Director for [facility] Pulmonary Rehabilitation.

## 2022-09-21 ENCOUNTER — Encounter: Payer: BC Managed Care – PPO | Admitting: *Deleted

## 2022-09-21 DIAGNOSIS — D860 Sarcoidosis of lung: Secondary | ICD-10-CM

## 2022-09-21 NOTE — Progress Notes (Signed)
Daily Session Note  Patient Details  Name: Stacie [name] MRN: [id number] Date of Birth: [birth year] Referring Provider:   Flowsheet Row Pulmonary Rehab from 07/25/2022 in [facility] Cardiac and Pulmonary Rehab  Referring Provider Ottie Glazier, MD       Encounter Date: 09/21/2022  Check In:  Session Check In - 09/21/22 1244       Check-In   Supervising physician immediately available to respond to emergencies See telemetry face sheet for immediately available ER MD    Location [facility]-Cardiac & Pulmonary Rehab    Staff Present Nyoka Cowden, RN, BSN, Lauretta Grill, RCP,RRT,BSRT;Noah Tickle, BS, Exercise Physiologist;Laureen Owens Shark, [place], RRT, CPFT;Meredith Sherryll Burger, RN BSN    Virtual Visit No    Medication changes reported     No    Fall or balance concerns reported    No    Tobacco Cessation No Change    Warm-up and Cool-down Performed on first and last piece of equipment    Resistance Training Performed Yes    VAD Patient? No    PAD/SET Patient? No      Pain Assessment   Currently in Pain? No/denies                Social History   Tobacco Use  Smoking Status Never  Smokeless Tobacco Never    Goals Met:  Independence with exercise equipment Exercise tolerated well No report of concerns or symptoms today  Goals Unmet:  Not Applicable  Comments: Pt able to follow exercise prescription today without complaint.  Will continue to monitor for progression.    Dr. Emily Filbert is Medical Director for [place].  Dr. Ottie Glazier is Medical Director for [facility] Pulmonary Rehabilitation.

## 2022-09-22 DIAGNOSIS — I10 Essential (primary) hypertension: Secondary | ICD-10-CM | POA: Diagnosis not present

## 2022-09-22 DIAGNOSIS — G4733 Obstructive sleep apnea (adult) (pediatric): Secondary | ICD-10-CM | POA: Diagnosis not present

## 2022-09-26 ENCOUNTER — Encounter: Payer: BC Managed Care – PPO | Admitting: *Deleted

## 2022-09-28 ENCOUNTER — Encounter: Payer: BC Managed Care – PPO | Admitting: *Deleted

## 2022-09-29 ENCOUNTER — Encounter: Payer: BC Managed Care – PPO | Admitting: *Deleted

## 2022-10-03 ENCOUNTER — Encounter: Payer: BC Managed Care – PPO | Admitting: *Deleted

## 2022-10-12 ENCOUNTER — Encounter: Payer: Self-pay | Admitting: *Deleted

## 2022-10-12 ENCOUNTER — Encounter: Payer: BC Managed Care – PPO | Admitting: *Deleted

## 2022-10-12 DIAGNOSIS — D860 Sarcoidosis of lung: Secondary | ICD-10-CM

## 2022-10-12 NOTE — Progress Notes (Signed)
Pulmonary Individual Treatment Plan  Patient Details  Name: Stacie [name] MRN: [id number] Date of Birth: 1956/06/13 Referring Provider:   Flowsheet Row Pulmonary Rehab from 07/25/2022 in [facility] Cardiac and Pulmonary Rehab  Referring Provider Ottie Glazier, MD       Initial Encounter Date:  Flowsheet Row Pulmonary Rehab from 07/25/2022 in [facility] Cardiac and Pulmonary Rehab  Date 07/25/22       Visit Diagnosis: Sarcoidosis of lung ([place])  Patient's Home Medications on Admission:  Current Outpatient Medications:    acetaminophen (TYLENOL) 500 MG tablet, Take 500 mg by mouth every 6 (six) hours as needed for moderate pain or headache., Disp: , Rfl:    denosumab (PROLIA) 60 MG/ML SOSY injection, Inject 60 mg into the skin every 6 (six) months., Disp: , Rfl:    dorzolamide (TRUSOPT) 2 % ophthalmic solution, Place 1 drop into both eyes 2 (two) times daily., Disp: , Rfl:    escitalopram (LEXAPRO) 20 MG tablet, Take 1 tablet (20 mg total) by mouth daily., Disp: 90 tablet, Rfl: 3   Fluticasone-Umeclidin-Vilant (TRELEGY ELLIPTA) 200-62.5-25 MCG/ACT AEPB, Inhale into the lungs., Disp: , Rfl:    latanoprost (XALATAN) 0.005 % ophthalmic solution, Place 1 drop into the right eye 2 (two) times daily., Disp: , Rfl:    levothyroxine (SYNTHROID) 137 MCG tablet, Take 1 tablet (137 mcg total) by mouth daily before breakfast., Disp: 90 tablet, Rfl: 3   Menthol (ICY HOT) 5 % PTCH, Apply 1 patch topically daily as needed (pain)., Disp: , Rfl:    Multiple Vitamins-Minerals (MULTIVITAMIN WITH MINERALS) tablet, Take 1 tablet by mouth daily., Disp: , Rfl:    naproxen sodium (ALEVE) 220 MG tablet, Take 220 mg by mouth daily as needed (pain)., Disp: , Rfl:    NON FORMULARY, CPAP nightly, Disp: , Rfl:    OXYGEN, Inhale 2 L into the lungs daily as needed (During walking and activity). , Disp: , Rfl:    predniSONE (DELTASONE) 10 MG tablet, Take 2 tablets (20 mg total) by mouth daily. Resume only after the finishing  new Rx for prednisone, Disp: , Rfl:    Probiotic Product (PROBIOTIC PO), Take 1 capsule by mouth daily., Disp: , Rfl:    Treprostinil (TYVASO) 0.6 MG/ML SOLN, Inhale into the lungs See admin instructions. 12 breaths 4 times a day, Disp: , Rfl:   Past Medical History: Past Medical History:  Diagnosis Date   Acute renal failure ([place])    Acute respiratory failure ([place]) 10/23/2021   Acute respiratory failure with hypoxia ([facility])    ARF (acute respiratory failure) ([place]) 10/19/2021   Arrhythmia    patient unaware if this is current   Asthma    Chronic kidney disease    Critical lower limb ischemia (HCC)    Depression    GERD (gastroesophageal reflux disease)    Heart murmur    History of kidney stones    HOH (hard of hearing)    wear aids   Hyperthyroidism    Hypothyroidism    IBS (irritable bowel syndrome)    Pneumonia    Pulmonary hypertension ([place])    Sarcoid    Sarcoidosis    Seasonal allergies    Sleep apnea CPAP with O2   Stroke ([place]) 07/2020   watershed   Wears hearing aid in both ears     Tobacco Use: Social History   Tobacco Use  Smoking Status Never  Smokeless Tobacco Never    Labs: Review Flowsheet  More data exists  Latest Ref Rng & Units 07/16/2020 07/21/2020 01/14/2021 10/19/2021 06/24/2022  Labs for ITP Cardiac and Pulmonary Rehab  Cholestrol 0 - 200 mg/dL - 289  - - -  LDL (calc) 0 - 99 mg/dL - 198  - - -  HDL-C >40 mg/dL - 33  - - -  Trlycerides <150 mg/dL - 290  - - -  Hemoglobin A1c 4.8 - 5.6 % 6.4  - - - 6.1   PH, Arterial 7.350 - 7.450 7.41  - - - -  PCO2 arterial 32.0 - 48.0 mmHg 52  - - - -  Bicarbonate 20.0 - 28.0 mmol/L 33.0  - - 31.7  -  TCO2 22 - 32 mmol/L - - 30  - -  O2 Saturation % 95.2  - - 33.6  -     Pulmonary Assessment Scores:  Pulmonary Assessment Scores     Row Name 07/25/22 1331 08/15/22 1643       ADL UCSD   ADL Phase -- Entry    SOB Score total -- 88    Rest -- 0    Walk -- 3    Stairs -- 5    Bath -- 5    Dress -- 5     Shop -- 5      CAT Score   CAT Score -- 21      mMRC Score   mMRC Score 4 --             UCSD: Self-administered rating of dyspnea associated with activities of daily living (ADLs) 6-point scale (0 = "not at all" to 5 = "maximal or unable to do because of breathlessness")  Scoring Scores range from 0 to 120.  Minimally important difference is 5 units  CAT: CAT can identify the health impairment of COPD patients and is better correlated with disease progression.  CAT has a scoring range of zero to 40. The CAT score is classified into four groups of low (less than 10), medium (10 - 20), high (21-30) and very high (31-40) based on the impact level of disease on health status. A CAT score over 10 suggests significant symptoms.  A worsening CAT score could be explained by an exacerbation, poor medication adherence, poor inhaler technique, or progression of COPD or comorbid conditions.  CAT MCID is 2 points  mMRC: mMRC (Modified Medical Research Council) Dyspnea Scale is used to assess the degree of baseline functional disability in patients of respiratory disease due to dyspnea. No minimal important difference is established. A decrease in score of 1 point or greater is considered a positive change.   Pulmonary Function Assessment:   Exercise Target Goals: Exercise Program Goal: Individual exercise prescription set using results from initial 6 min walk test and THRR while considering  patient's activity barriers and safety.   Exercise Prescription Goal: Initial exercise prescription builds to 30-45 minutes a day of aerobic activity, 2-3 days per week.  Home exercise guidelines will be given to patient during program as part of exercise prescription that the participant will acknowledge.  Education: Aerobic Exercise: - Group verbal and visual presentation on the components of exercise prescription. Introduces F.I.T.T principle from ACSM for exercise prescriptions.  Reviews  F.I.T.T. principles of aerobic exercise including progression. Written material given at graduation. Flowsheet Row Pulmonary Rehab from 08/10/2018 in [facility] Cardiac and Pulmonary Rehab  Date 05/02/18  Educator [facility]  Instruction Review Code 1- Verbalizes Understanding       Education: Resistance Exercise: - Group verbal  and visual presentation on the components of exercise prescription. Introduces F.I.T.T principle from ACSM for exercise prescriptions  Reviews F.I.T.T. principles of resistance exercise including progression. Written material given at graduation.    Education: Exercise & Equipment Safety: - Individual verbal instruction and demonstration of equipment use and safety with use of the equipment. Flowsheet Row Pulmonary Rehab from 08/10/2022 in [facility] Cardiac and Pulmonary Rehab  Date 07/25/22  Educator NT  Instruction Review Code 1- Verbalizes Understanding       Education: Exercise Physiology & General Exercise Guidelines: - Group verbal and written instruction with models to review the exercise physiology of the cardiovascular system and associated critical values. Provides general exercise guidelines with specific guidelines to those with heart or lung disease.  Flowsheet Row Pulmonary Rehab from 08/10/2018 in [facility] Cardiac and Pulmonary Rehab  Date 07/11/18  Educator [facility]  Instruction Review Code 1- Verbalizes Understanding       Education: Flexibility, Balance, Mind/Body Relaxation: - Group verbal and visual presentation with interactive activity on the components of exercise prescription. Introduces F.I.T.T principle from ACSM for exercise prescriptions. Reviews F.I.T.T. principles of flexibility and balance exercise training including progression. Also discusses the mind body connection.  Reviews various relaxation techniques to help reduce and manage stress (i.e. Deep breathing, progressive muscle relaxation, and visualization). Balance handout provided to take home.  Written material given at graduation. Flowsheet Row Pulmonary Rehab from 08/10/2018 in [facility] Cardiac and Pulmonary Rehab  Date 08/08/18  Educator AS  Instruction Review Code 1- Verbalizes Understanding       Activity Barriers & Risk Stratification:  Activity Barriers & Cardiac Risk Stratification - 07/25/22 1310       Activity Barriers & Cardiac Risk Stratification   Activity Barriers Other (comment);Shortness of Breath    Comments stroke 2021- left foot 3 toes amputated; broke elbow after tripping over a cord ("healed")             6 Minute Walk:  6 Minute Walk     Row Name 07/25/22 1306         6 Minute Walk   Phase Initial     Distance 705 feet     Walk Time 5.83 minutes     # of Rest Breaks 1     MPH 1.37     METS 2.25     RPE 15     Perceived Dyspnea  4     VO2 Peak 7.88     Symptoms Yes (comment)     Comments SOB     Resting HR 116 bpm     Resting BP 124/66     Resting Oxygen Saturation  91 %     Exercise Oxygen Saturation  during 6 min walk 84 %     Max Ex. HR 133 bpm     Max Ex. BP 140/80     2 Minute Post BP 128/72       Interval HR   1 Minute HR 116     2 Minute HR 127     3 Minute HR 129     4 Minute HR 127     5 Minute HR 133     6 Minute HR 128     2 Minute Post HR 119     Interval Heart Rate? Yes       Interval Oxygen   Interval Oxygen? Yes     Baseline Oxygen Saturation % 91 %     1 Minute Oxygen Saturation %  91 %     1 Minute Liters of Oxygen 5 L  Pulsed     2 Minute Oxygen Saturation % 90 %     2 Minute Liters of Oxygen 5 L  Pulsed     3 Minute Oxygen Saturation % 84 %     3 Minute Liters of Oxygen 5 L  Pulsed     4 Minute Oxygen Saturation % 84 %     4 Minute Liters of Oxygen 5 L  Pulsed     5 Minute Oxygen Saturation % 86 %     5 Minute Liters of Oxygen 5 L  Pulsed     6 Minute Oxygen Saturation % 85 %     6 Minute Liters of Oxygen 5 L  Pulsed     2 Minute Post Oxygen Saturation % 90 %     2 Minute Post Liters of Oxygen 5 L   Pulsed             Oxygen Initial Assessment:  Oxygen Initial Assessment - 07/12/22 1334       Home Oxygen   Home Oxygen Device Portable Concentrator;Home Concentrator    Sleep Oxygen Prescription CPAP    Liters per minute 2    Home Exercise Oxygen Prescription Continuous    Liters per minute 2    Home Resting Oxygen Prescription Continuous   Doctor told her she could use prn during rest   Liters per minute 2    Compliance with Home Oxygen Use Yes      Intervention   Short Term Goals To learn and exhibit compliance with exercise, home and travel O2 prescription;To learn and understand importance of monitoring SPO2 with pulse oximeter and demonstrate accurate use of the pulse oximeter.;To learn and understand importance of maintaining oxygen saturations>88%;To learn and demonstrate proper pursed lip breathing techniques or other breathing techniques. ;To learn and demonstrate proper use of respiratory medications    Long  Term Goals Exhibits compliance with exercise, home  and travel O2 prescription;Verbalizes importance of monitoring SPO2 with pulse oximeter and return demonstration;Maintenance of O2 saturations>88%;Exhibits proper breathing techniques, such as pursed lip breathing or other method taught during program session;Compliance with respiratory medication;Demonstrates proper use of MDI's             Oxygen Re-Evaluation:  Oxygen Re-Evaluation     Row Name 07/25/22 1015 08/29/22 1131 09/19/22 1141         Program Oxygen Prescription   Program Oxygen Prescription -- Continuous Continuous     Liters per minute -- 2 2       Home Oxygen   Home Oxygen Device -- Portable Concentrator;Home Concentrator Portable Concentrator;Home Concentrator     Sleep Oxygen Prescription -- CPAP CPAP     Liters per minute -- 2 2     Home Exercise Oxygen Prescription -- Continuous Continuous     Liters per minute -- 2 2     Home Resting Oxygen Prescription -- Continuous Continuous      Liters per minute -- 2 2     Compliance with Home Oxygen Use -- Yes Yes       Goals/Expected Outcomes   Short Term Goals -- To learn and exhibit compliance with exercise, home and travel O2 prescription;To learn and understand importance of monitoring SPO2 with pulse oximeter and demonstrate accurate use of the pulse oximeter.;To learn and understand importance of maintaining oxygen saturations>88%;To learn and demonstrate proper pursed lip breathing techniques or other  breathing techniques. ;To learn and demonstrate proper use of respiratory medications To learn and exhibit compliance with exercise, home and travel O2 prescription;To learn and understand importance of monitoring SPO2 with pulse oximeter and demonstrate accurate use of the pulse oximeter.;To learn and understand importance of maintaining oxygen saturations>88%;To learn and demonstrate proper pursed lip breathing techniques or other breathing techniques. ;To learn and demonstrate proper use of respiratory medications     Long  Term Goals -- Exhibits compliance with exercise, home  and travel O2 prescription;Verbalizes importance of monitoring SPO2 with pulse oximeter and return demonstration;Maintenance of O2 saturations>88%;Exhibits proper breathing techniques, such as pursed lip breathing or other method taught during program session;Compliance with respiratory medication;Demonstrates proper use of MDI's Exhibits compliance with exercise, home  and travel O2 prescription;Verbalizes importance of monitoring SPO2 with pulse oximeter and return demonstration;Maintenance of O2 saturations>88%;Exhibits proper breathing techniques, such as pursed lip breathing or other method taught during program session;Compliance with respiratory medication;Demonstrates proper use of MDI's     Comments Reviewed PLB technique with pt.  Talked about how it works and it's importance in maintaining their exercise saturations. Reviewed PLB technique with pt.   Talked about how it works and it's importance in maintaining their exercise saturations. Tye [place] has a pulse oximeter at home and has been checking her O2 saturations. When she walks at home she reports that she does experience desaturation but she pauses to let her oxygen come back to normal levels. Tye [place] is doing well in rehab.  She is doing well on her oxgygen at home and saturations are staying up.  She is doing well with her CPAP but has started a new mask type that is not fitting as well.  She may go back to other form.  She is feeling good with her breathing and using her PLB consistently.     Goals/Expected Outcomes Short: Become more profiecient at using PLB.   Long: Become independent at using PLB. Short: Become more profiecient at using PLB.   Long: Become independent at using PLB. Short: Check on CPAP mask again Long: Conitnue to use PLB              Oxygen Discharge (Final Oxygen Re-Evaluation):  Oxygen Re-Evaluation - 09/19/22 1141       Program Oxygen Prescription   Program Oxygen Prescription Continuous    Liters per minute 2      Home Oxygen   Home Oxygen Device Portable Concentrator;Home Concentrator    Sleep Oxygen Prescription CPAP    Liters per minute 2    Home Exercise Oxygen Prescription Continuous    Liters per minute 2    Home Resting Oxygen Prescription Continuous    Liters per minute 2    Compliance with Home Oxygen Use Yes      Goals/Expected Outcomes   Short Term Goals To learn and exhibit compliance with exercise, home and travel O2 prescription;To learn and understand importance of monitoring SPO2 with pulse oximeter and demonstrate accurate use of the pulse oximeter.;To learn and understand importance of maintaining oxygen saturations>88%;To learn and demonstrate proper pursed lip breathing techniques or other breathing techniques. ;To learn and demonstrate proper use of respiratory medications    Long  Term Goals Exhibits compliance with exercise, home  and  travel O2 prescription;Verbalizes importance of monitoring SPO2 with pulse oximeter and return demonstration;Maintenance of O2 saturations>88%;Exhibits proper breathing techniques, such as pursed lip breathing or other method taught during program session;Compliance with respiratory medication;Demonstrates proper use of MDI's  Comments Tye [place] is doing well in rehab.  She is doing well on her oxgygen at home and saturations are staying up.  She is doing well with her CPAP but has started a new mask type that is not fitting as well.  She may go back to other form.  She is feeling good with her breathing and using her PLB consistently.    Goals/Expected Outcomes Short: Check on CPAP mask again Long: Conitnue to use PLB             Initial Exercise Prescription:  Initial Exercise Prescription - 07/27/22 1100       Intensity   THRR 40-80% of Max Heartrate 118 - 141   New   Ratings of Perceived Exertion 11-13    Perceived Dyspnea 0-4             Perform Capillary Blood Glucose checks as needed.  Exercise Prescription Changes:   Exercise Prescription Changes     Row Name 07/25/22 1300 07/27/22 1100 08/09/22 1400 08/22/22 1100 09/20/22 1500     Response to Exercise   Blood Pressure (Admit) 124/66 -- 120/62 130/70 132/74   Blood Pressure (Exercise) 140/80 -- 138/76 138/62 128/72   Blood Pressure (Exit) 128/72 -- 140/82 134/72 122/64   Heart Rate (Admit) 116 bpm -- 97 bpm 96 bpm 103 bpm   Heart Rate (Exercise) 133 bpm -- 127 bpm 119 bpm 121 bpm   Heart Rate (Exit) 119 bpm -- 109 bpm 104 bpm 105 bpm   Oxygen Saturation (Admit) 91 % -- 96 % 95 % 97 %   Oxygen Saturation (Exercise) 84 % -- 89 % 91 % 89 %   Oxygen Saturation (Exit) 90 % -- 96 % 95 % 95 %   Rating of Perceived Exertion (Exercise) 14 -- _0 Perceived Dyspnea (Exercise) 4 -- _1 Symptoms SOB -- SOB SOB SOB   Comments 6MWT Results -- First 3 sessions in rehab -- --   Duration -- -- Continue with 30 min of  aerobic exercise without signs/symptoms of physical distress. Continue with 30 min of aerobic exercise without signs/symptoms of physical distress. Continue with 30 min of aerobic exercise without signs/symptoms of physical distress.   Intensity -- THRR New  Changed 118-141 THRR unchanged THRR unchanged THRR unchanged     Progression   Progression -- -- Continue to progress workloads to maintain intensity without signs/symptoms of physical distress. Continue to progress workloads to maintain intensity without signs/symptoms of physical distress. Continue to progress workloads to maintain intensity without signs/symptoms of physical distress.   Average METs -- -- 1.9 2.07 2.03     Resistance Training   Training Prescription -- -- Yes Yes Yes   Weight -- -- 3 lb 3 lb 3 lb   Reps -- -- 10-15 10-15 10-15     Interval Training   Interval Training -- -- No No No     Oxygen   Oxygen -- -- Continuous Continuous Continuous   Liters -- -- 3 3 3-4     Treadmill   MPH -- 1.5 1.1 1.2 1   Grade -- 0 0 0 0   Minutes -- _2 METs -- 2.15 1.84 1.92 1.8     Recumbant Bike   Level -- -- -- -- 1.2   Watts -- -- -- -- 15   Minutes -- -- -- -- 15   METs -- -- -- -- 2.54  NuStep   Level -- -- -- 1 4   Minutes -- -- -- 15 15   METs -- -- -- 2 1.7     REL-XR   Level -- -- _0 Minutes -- -- _1 METs -- -- 2.5 2.9 2.4     T5 Nustep   Level -- -- 1 1 --   Minutes -- -- 15 15 --   METs -- -- 1.6 2 --     Oxygen   Maintain Oxygen Saturation -- -- 88% or higher 88% or higher 88% or higher    Row Name 10/04/22 1500             Response to Exercise   Blood Pressure (Admit) 140/70       Blood Pressure (Exit) 124/70       Heart Rate (Admit) 107 bpm       Heart Rate (Exercise) 119 bpm       Heart Rate (Exit) 116 bpm       Oxygen Saturation (Admit) 94 %       Oxygen Saturation (Exercise) 89 %       Oxygen Saturation (Exit) 95 %       Rating of Perceived Exertion  (Exercise) 12       Perceived Dyspnea (Exercise) 3       Symptoms SOB       Duration Continue with 30 min of aerobic exercise without signs/symptoms of physical distress.       Intensity THRR unchanged         Progression   Progression Continue to progress workloads to maintain intensity without signs/symptoms of physical distress.       Average METs 2.28         Resistance Training   Training Prescription Yes       Weight 3 lb       Reps 10-15         Interval Training   Interval Training No         Oxygen   Oxygen Continuous       Liters 4         Treadmill   MPH 1.2       Grade 0       Minutes 15       METs 1.92         NuStep   Level 3       Minutes 15       METs 2         REL-XR   Level 1       Minutes 15       METs 3.2         Biostep-RELP   Level 1       Minutes 15       METs 2         Oxygen   Maintain Oxygen Saturation 88% or higher                Exercise Comments:   Exercise Comments     Row Name 07/25/22 1012           Exercise Comments First full day of exercise!  Patient was oriented to gym and equipment including functions, settings, policies, and procedures.  Patient's individual exercise prescription and treatment plan were reviewed.  All starting workloads were established based on the results of the 6 minute walk test done at initial orientation  visit.  The plan for exercise progression was also introduced and progression will be customized based on patient's performance and goals.                Exercise Goals and Review:   Exercise Goals     Row Name 07/25/22 1330             Exercise Goals   Increase Physical Activity Yes       Intervention Provide advice, education, support and counseling about physical activity/exercise needs.;Develop an individualized exercise prescription for aerobic and resistive training based on initial evaluation findings, risk stratification, comorbidities and participant's personal goals.        Expected Outcomes Short Term: Attend rehab on a regular basis to increase amount of physical activity.;Long Term: Add in home exercise to make exercise part of routine and to increase amount of physical activity.;Long Term: Exercising regularly at least 3-5 days a week.       Increase Strength and Stamina Yes       Intervention Provide advice, education, support and counseling about physical activity/exercise needs.;Develop an individualized exercise prescription for aerobic and resistive training based on initial evaluation findings, risk stratification, comorbidities and participant's personal goals.       Expected Outcomes Short Term: Increase workloads from initial exercise prescription for resistance, speed, and METs.;Short Term: Perform resistance training exercises routinely during rehab and add in resistance training at home;Long Term: Improve cardiorespiratory fitness, muscular endurance and strength as measured by increased METs and functional capacity (6MWT)       Able to understand and use rate of perceived exertion (RPE) scale Yes       Intervention Provide education and explanation on how to use RPE scale       Expected Outcomes Short Term: Able to use RPE daily in rehab to express subjective intensity level;Long Term:  Able to use RPE to guide intensity level when exercising independently       Able to understand and use Dyspnea scale Yes       Intervention Provide education and explanation on how to use Dyspnea scale       Expected Outcomes Short Term: Able to use Dyspnea scale daily in rehab to express subjective sense of shortness of breath during exertion;Long Term: Able to use Dyspnea scale to guide intensity level when exercising independently       Knowledge and understanding of Target Heart Rate Range (THRR) Yes       Intervention Provide education and explanation of THRR including how the numbers were predicted and where they are located for reference       Expected  Outcomes Short Term: Able to state/look up THRR;Short Term: Able to use daily as guideline for intensity in rehab;Long Term: Able to use THRR to govern intensity when exercising independently       Able to check pulse independently Yes       Intervention Provide education and demonstration on how to check pulse in carotid and radial arteries.;Review the importance of being able to check your own pulse for safety during independent exercise       Expected Outcomes Short Term: Able to explain why pulse checking is important during independent exercise;Long Term: Able to check pulse independently and accurately       Understanding of Exercise Prescription Yes       Intervention Provide education, explanation, and written materials on patient's individual exercise prescription       Expected Outcomes Short Term:  Able to explain program exercise prescription;Long Term: Able to explain home exercise prescription to exercise independently                Exercise Goals Re-Evaluation :  Exercise Goals Re-Evaluation     [place] Name 07/25/22 1012 08/08/22 1108 08/09/22 1419 08/22/22 1200 08/29/22 1115     Exercise Goal Re-Evaluation   Exercise Goals Review Able to understand and use rate of perceived exertion (RPE) scale;Able to understand and use Dyspnea scale;Knowledge and understanding of Target Heart Rate Range (THRR);Understanding of Exercise Prescription Understanding of Exercise Prescription;Increase Physical Activity;Increase Strength and Stamina Understanding of Exercise Prescription;Increase Physical Activity;Increase Strength and Stamina Understanding of Exercise Prescription;Increase Physical Activity;Increase Strength and Stamina Understanding of Exercise Prescription;Increase Physical Activity;Increase Strength and Stamina   Comments Reviewed RPE scale, THR and program prescription with pt today.  Pt voiced understanding and was given a copy of goals to take home. Tye Maryland feels that she is doing  well with exercise. She states that she feels stronger since starting the program. She stated that she has not seen a huge benefit in her breathing but she wants to continue to work towards that goal. She does also state that she felt over exerted last week, but states it could have been due to being ill. We will continue to monitor her progress in the program. Tye Maryland is doing well in rehab. Her average overall MET level after her first 3 sessions was 1.9 METs. She also has done well walking on the treadmill at 1.1 mph without desaturating. She has tolerated 3 lb hand weights for resistance training as well. We will continue to monitor her progress in the program. Tye Maryland continues to do well in rehab. She was able to get her speed on the treadmill to 1.2 mph, though walking is challenging for her. She has not increased her level on other seated machines but all are reflecting appropriate RPEs. She should work toward increasing to level 2 on the T4 Nustep. Oxygen saturations are staying above 88%. Will continue to monitor. Tye Maryland is doing well in rehab. She states that she has more energy since starting the program. She has been walking on the treadmill at a speed of 1.2 mph, but has had some trouble with her oxygen saturation dropping below 88. Tye Maryland has been doing some walking at home  about 2-3 times a week as well, and feels she has seen some improvement there as well. We will continue to monitor her progress in the program.   Expected Outcomes Short: Use RPE daily to regulate intensity.  Long: Follow program prescription in THR. Short: Continue to follow current exercise prescription. Long: Continue to increase strength and stamina. Short: Continue to increase workloads as tolerated. Long: Continue to increase strength and stamina. Short: Increase to level 2 on T4 Nustep. Long: Continue to increase overall MET level Short: Continue to walk at home on days away from rehab. Long: Continue to increase strength and  stamina.    [place] Name 09/19/22 1127 09/20/22 1503 10/04/22 1505         Exercise Goal Re-Evaluation   Exercise Goals Review Understanding of Exercise Prescription;Increase Physical Activity;Increase Strength and Stamina Understanding of Exercise Prescription;Increase Physical Activity;Increase Strength and Stamina Understanding of Exercise Prescription;Increase Physical Activity;Increase Strength and Stamina     Comments Tye Maryland is doing well in rehab.  She feels good with her exercise in class.  She is walking on deck at home on her off days.  She is also using  her pedal machine at home.  She has been doing her stairs more at home.  She is feeling stronger overall and feeling better. Tye Maryland has been having intermittent attendance due to being sick. Once returned, she was able to increase to level 4 on the T4 Nustep. She would benefit from increasing her level on the XR. Perhaps we can also add a small incline to her TM as her speed has been consistent. Oxygen saturations are staying above 88%. Will continue to monitor. Tye Maryland is doing well in the program. She recently increased her overall average MET level of 2.28 METs. She also was able to increase her speed back up to 1.2 mph on the treadmill. She also has kept her oxygen saturations above 88% since she began using 3L of O2 for exercise. We will continue to monitor her progress in the program.     Expected Outcomes Short: Conitnue to add in more walking at home Long: conitnue to improve stamina. Short: Increase level on XR Long: Continue to increase overall MET level Short: Increase level on XR. Long: Continue to improve strength and stamina.              Discharge Exercise Prescription (Final Exercise Prescription Changes):  Exercise Prescription Changes - 10/04/22 1500       Response to Exercise   Blood Pressure (Admit) 140/70    Blood Pressure (Exit) 124/70    Heart Rate (Admit) 107 bpm    Heart Rate (Exercise) 119 bpm    Heart Rate  (Exit) 116 bpm    Oxygen Saturation (Admit) 94 %    Oxygen Saturation (Exercise) 89 %    Oxygen Saturation (Exit) 95 %    Rating of Perceived Exertion (Exercise) 12    Perceived Dyspnea (Exercise) 3    Symptoms SOB    Duration Continue with 30 min of aerobic exercise without signs/symptoms of physical distress.    Intensity THRR unchanged      Progression   Progression Continue to progress workloads to maintain intensity without signs/symptoms of physical distress.    Average METs 2.28      Resistance Training   Training Prescription Yes    Weight 3 lb    Reps 10-15      Interval Training   Interval Training No      Oxygen   Oxygen Continuous    Liters 4      Treadmill   MPH 1.2    Grade 0    Minutes 15    METs 1.92      NuStep   Level 3    Minutes 15    METs 2      REL-XR   Level 1    Minutes 15    METs 3.2      Biostep-RELP   Level 1    Minutes 15    METs 2      Oxygen   Maintain Oxygen Saturation 88% or higher             Nutrition:  Target Goals: Understanding of nutrition guidelines, daily intake of sodium <1552m, cholesterol <2088m calories 30% from fat and 7% or less from saturated fats, daily to have 5 or more servings of fruits and vegetables.  Education: All About Nutrition: -Group instruction provided by verbal, written material, interactive activities, discussions, models, and posters to present general guidelines for heart healthy nutrition including fat, fiber, MyPlate, the role of sodium in heart healthy nutrition, utilization of the nutrition label,  and utilization of this knowledge for meal planning. Follow up email sent as well. Written material given at graduation. Flowsheet Row Pulmonary Rehab from 08/10/2018 in [facility] Card[facility] Pulmonary Rehab  Date 06/27/18  Educator LB  Instruction Review Code 1- Verbalizes Understanding       Biometrics:  Pre Biometrics - 07/25/22 1330       Pre Biometrics   Height _0  (1.676 m)     Weight 188 lb 9.6 oz (85.5 kg)    Waist Circumference 37 inches    Hip Circumference 44 inches    Waist to Hip Ratio 0.84 %    BMI (Calculated) 30.46              Nutrition Therapy Plan and Nutrition Goals:  Nutrition Therapy & Goals - 07/20/22 1517       Nutrition Therapy   Diet Heart healthy, low Na, pulmonary MNT    Protein (specify units) 90-95g    Fiber 27 grams    Whole Grain Foods 3 servings    Saturated Fats 15 max. grams    Fruits and Vegetables 8 servings/day    Sodium 1.5 grams      Personal Nutrition Goals   Nutrition Goal ST: practice MyPlate guidelines (adding in 1-2 additional servings of non-starchy vegetables), be sure to include at least 1 good source of protein at meals and snacks, limit sodium by reading food labels and using other ways to flavor food LT: limit Na <1.5g/day, have at least 5 servings of vegetables/day    Comments 66 y.o. F admitted to pulmonary rehab for sarcoidosis of lung. PMHx includes hypothyroidism, HLD, CKD stg 3, asthma, HTN, OSA, elevated BG (A1C 6.4), anxiety. Relevant medications includes lexapro, synthroid, MVI, prednisone, probiotic, tyvaso, trilogy. B: healthy waffles occasionally. She will have eggs with sausage. S: cheese and crackers or yogurt (greek yogurt) L: egg salad or tuna salad with lettuce, rye bread. S: cheese and crackers or yogurt (greek yogurt) D: protein (meat or chicken) with a salad and more starchy vegetables. S: sometimes low calorie ice cream bar. She loves fruit and will have it with most meals. She limits dairy as this can make her not feel great. She will use avocado oil and coconut aminos mostly, she does also use "no salt" which replaces sodium chloride with potassium choride. Drinks: Coffee (sometimes), water. She reports that her daughter is health conscious and has taken care of her for about 2 years - she is now going back to work. Discussed general heart healthy eating, sodium, and pulmonary MNT. Also reviewed  ways to maintain a healthy BG. Advised to stop using the "no salt" and to decrease the salt in her processed foods as well as adding other ways to flavor her food. Since her protein needs are higher, suggested she have at least one good source of protein at each meal.      Intervention Plan   Intervention Prescribe, educate and counsel regarding individualized specific dietary modifications aiming towards targeted core components such as weight, hypertension, lipid management, diabetes, heart failure and other comorbidities.    Expected Outcomes Short Term Goal: Understand basic principles of dietary content, such as calories, fat, sodium, cholesterol and nutrients.;Short Term Goal: A plan has been developed with personal nutrition goals set during dietitian appointment.;Long Term Goal: Adherence to prescribed nutrition plan.             Nutrition Assessments:  MEDIFICTS Score Key: ?70 Need to make dietary changes  40-70 Heart  Healthy Diet ? 40 Therapeutic Level Cholesterol Diet  Flowsheet Row Pulmonary Rehab from 08/15/2022 in [facility] Cardiac and Pulmonary Rehab  Picture Your Plate Total Score on Admission 58      Picture Your Plate Scores: <92 Unhealthy dietary pattern with much room for improvement. 41-50 Dietary pattern unlikely to meet recommendations for good health and room for improvement. 51-60 More healthful dietary pattern, with some room for improvement.  >60 Healthy dietary pattern, although there may be some specific behaviors that could be improved.   Nutrition Goals Re-Evaluation:  Nutrition Goals Re-Evaluation     [place] Name 08/08/22 1113 08/29/22 1124 09/19/22 1134         Goals   Nutrition Goal ST: practice MyPlate guidelines (adding in 1-2 additional servings of non-starchy vegetables), be sure to include at least 1 good source of protein at meals and snacks, limit sodium by reading food labels and using other ways to flavor food LT: limit Na <1.5g/day, have at  least 5 servings of vegetables/day -- Short: She will continue to eat fruits and vegetables daily, and to read food labels. Long: Continue to follow healthy eating patterns discussed with RD.     Comment She has been making smoothies regularly and has been adding more vegetables than fruit to them to help increase her intake. Including fruits and vegetables daily. She continues to read food labels regularly and feels that she has been able to maintain new healthier habits -- Tye Maryland is doing well with her diet.  Her weight is going back down now.  She is getting in more protein and getting in eggs and yogurt for breakfast. She is working on reading her food labels and watching sodium and sugar intake.  Her daughhter is helping her keep on top of her diet.     Expected Outcome Short: She will continue to eat fruits and vegetables daily, and to read food labels. Long: Continue to follow healthy eating patterns discussed with RD. Short: She will continue to eat fruits and vegetables daily, and to read food labels. Long: Continue to follow healthy eating patterns discussed with RD. Short: Continue to work on adding in protein Long: Continue to work on healthy eating       Personal Goal #2 Re-Evaluation   Personal Goal #2 -- Continue to educate yourself on foods high in sodium and added sugar. Look for foods with less than ~10g added sugar per serving and less than ~288m sodium per serving --              Nutrition Goals Discharge (Final Nutrition Goals Re-Evaluation):  Nutrition Goals Re-Evaluation - 09/19/22 1134       Goals   Nutrition Goal Short: She will continue to eat fruits and vegetables daily, and to read food labels. Long: Continue to follow healthy eating patterns discussed with RD.    Comment CTye Marylandis doing well with her diet.  Her weight is going back down now.  She is getting in more protein and getting in eggs and yogurt for breakfast. She is working on reading her food labels and  watching sodium and sugar intake.  Her daughhter is helping her keep on top of her diet.    Expected Outcome Short: Continue to work on adding in protein Long: Continue to work on healthy eating             Psychosocial: Target Goals: Acknowledge presence or absence of significant depression and/or stress, maximize coping skills, provide positive support system.  Participant is able to verbalize types and ability to use techniques and skills needed for reducing stress and depression.   Education: Stress, Anxiety, and Depression - Group verbal and visual presentation to define topics covered.  Reviews how body is impacted by stress, anxiety, and depression.  Also discusses healthy ways to reduce stress and to treat/manage anxiety and depression.  Written material given at graduation. Flowsheet Row Pulmonary Rehab from 08/10/2022 in [facility] Cardiac and Pulmonary Rehab  Date 08/10/22  Educator [facility]  Instruction Review Code 1- [employer] Understanding       Education: Sleep Hygiene -Provides group verbal and written instruction about how sleep can affect your health.  Define sleep hygiene, discuss sleep cycles and impact of sleep habits. Review good sleep hygiene tips.  Flowsheet Row Pulmonary Rehab from 08/10/2018 in [facility] Cardiac and Pulmonary Rehab  Date 04/11/18  Educator [facility]  Instruction Review Code 1- Verbalizes Understanding       Initial Review & Psychosocial Screening:  Initial Psych Review & Screening - 07/12/22 1339       Initial Review   Current issues with Current Stress Concerns    Source of Stress Concerns Chronic Illness      Family Dynamics   Good Support System? Yes   family and friends     Barriers   Psychosocial barriers to participate in program There are no identifiable barriers or psychosocial needs.;The patient should benefit from training in stress management and relaxation.      Screening Interventions   Interventions Encouraged to exercise;Provide  feedback about the scores to participant;To provide support and resources with identified psychosocial needs    Expected Outcomes Short Term goal: Utilizing psychosocial counselor, staff and physician to assist with identification of specific Stressors or current issues interfering with healing process. Setting desired goal for each stressor or current issue identified.;Long Term Goal: Stressors or current issues are controlled or eliminated.;Short Term goal: Identification and review with participant of any Quality of Life or Depression concerns found by scoring the questionnaire.;Long Term goal: The participant improves quality of Life and PHQ9 Scores as seen by post scores and/or verbalization of changes             Quality of Life Scores:  Scores of 19 and below usually indicate a poorer quality of life in these areas.  A difference of  2-3 points is a clinically meaningful difference.  A difference of 2-3 points in the total score of the Quality of Life Index has been associated with significant improvement in overall quality of life, self-image, physical symptoms, and general health in studies assessing change in quality of life.  PHQ-9: Review Flowsheet  More data exists      07/20/2022 06/24/2022 10/13/2021 09/28/2021 09/21/2020  Depression screen PHQ 2/9  Decreased Interest 0 0 0 0 0  Down, Depressed, Hopeless 0 0 0 0 0  PHQ - 2 Score 0 0 0 0 0  Altered sleeping 1 0 0 0 1  Tired, decreased energy 1 0 0 0 1  Change in appetite 0 0 0 0 0  Feeling bad or failure about yourself  0 0 0 0 0  Trouble concentrating 1 0 0 0 0  Moving slowly or fidgety/restless 0 0 0 0 0  Suicidal thoughts 0 0 0 0 0  PHQ-9 Score 3 0 0 0 2  Difficult doing work/chores Not difficult at all Not difficult at all Not difficult at all Not difficult at all Not difficult at all  Interpretation of Total Score  Total Score Depression Severity:  1-4 = Minimal depression, 5-9 = Mild depression, 10-14 = Moderate  depression, 15-19 = Moderately severe depression, 20-27 = Severe depression   Psychosocial Evaluation and Intervention:  Psychosocial Evaluation - 07/12/22 1352       Psychosocial Evaluation & Interventions   Interventions Encouraged to exercise with the program and follow exercise prescription;Relaxation education    Comments Tye Maryland is returning to pulmonary rehab by request. She had completed the program in 2019 and after having a stroke and increased breathing issues, she asked to come back. She wants to be able to play with her 11 and soon to be 12 grandkids, and states she knows her breathing may not get better but she wants to feel better.  Her strength and stamina are also things she wants to work on. She does not report any sleep concerns. She reports that sometimes she may feel down about all the medical things that have happened recently, but she doesn't stay there long because of her amazing support system.    Expected Outcomes SHort: attend pulmonary rehab for educaiton and exercise. Long: develop and maintain positive self care habits.    Continue Psychosocial Services  Follow up required by staff             Psychosocial Re-Evaluation:  Psychosocial Re-Evaluation     [place] Name 07/28/22 1130 08/29/22 1120 09/19/22 1129         Psychosocial Re-Evaluation   Current issues with Current Psychotropic Meds;Current Stress Concerns Current Psychotropic Meds;Current Stress Concerns Current Psychotropic Meds;Current Stress Concerns     Comments Tye Maryland states that she is stressed due to her stroke and hear breathing issues. He has a positive outlook on improving her health. Her breathing has got worse over the last couple years because she was not able to exercise much. Tye Maryland denies any major stressors at this time. She states that she has a good support system at home made up mostly by her husband and children. For stress relief she likes to read and study her Bible and play with her  grandchildren. She also feels that exercise has been a good stress releiver for her as well. Tye Maryland is doing well in rehab.  She is doing more stairs than she used to so she is not relying on her daughter as much now.  She says her older grandkids helped decorate for Christmas but went over the top, but she was grateful for their help.  She does not have any major stressors currently.  She had a call from credit card to watch her account spending, but they were all legit charges.  She is still sleeping well. She is still taking time for herself for Bible study and spending time with her grandchildren.     Expected Outcomes Short: Start [name] to help with mood. Long: Maintain a healthy mental state Short: Continue [name] to help with mood. Long: Maintain a positive outlook. Short; COnitnue to exercise for menatl boost Long: Continue to stay positive     Interventions Relaxation education;Stress management education;Encouraged to attend Pulmonary Rehabilitation for the exercise Relaxation education;Stress management education;Encouraged to attend Pulmonary Rehabilitation for the exercise Relaxation education;Stress management education;Encouraged to attend Pulmonary Rehabilitation for the exercise     Continue Psychosocial Services  Follow up required by staff Follow up required by staff Follow up required by staff              Psychosocial Discharge (Final Psychosocial Re-Evaluation):  Psychosocial Re-Evaluation - 09/19/22 1129       Psychosocial Re-Evaluation   Current issues with Current Psychotropic Meds;Current Stress Concerns    Comments Tye Maryland is doing well in rehab.  She is doing more stairs than she used to so she is not relying on her daughter as much now.  She says her older grandkids helped decorate for Christmas but went over the top, but she was grateful for their help.  She does not have any major stressors currently.  She had a call from credit card to watch her account spending,  but they were all legit charges.  She is still sleeping well. She is still taking time for herself for Bible study and spending time with her grandchildren.    Expected Outcomes Short; COnitnue to exercise for menatl boost Long: Continue to stay positive    Interventions Relaxation education;Stress management education;Encouraged to attend Pulmonary Rehabilitation for the exercise    Continue Psychosocial Services  Follow up required by staff             Education: Education Goals: Education classes will be provided on a weekly basis, covering required topics. Participant will state understanding/return demonstration of topics presented.  Learning Barriers/Preferences:   General Pulmonary Education Topics:  Infection Prevention: - Provides verbal and written material to individual with discussion of infection control including proper hand washing and proper equipment cleaning during exercise session. Flowsheet Row Pulmonary Rehab from 08/10/2022 in [facility] Cardiac and Pulmonary Rehab  Date 07/27/22  Educator NT  Instruction Review Code 1- Verbalizes Understanding       Falls Prevention: - Provides verbal and written material to individual with discussion of falls prevention and safety. Flowsheet Row Pulmonary Rehab from 08/10/2022 in [facility] Cardiac and Pulmonary Rehab  Date 07/27/22  Educator NT  Instruction Review Code 1- Verbalizes Understanding       Chronic Lung Disease Review: - Group verbal instruction with posters, models, PowerPoint presentations and videos,  to review new updates, new respiratory medications, new advancements in procedures and treatments. Providing information on websites and "800" numbers for continued self-education. Includes information about supplement oxygen, available portable oxygen systems, continuous and intermittent flow rates, oxygen safety, concentrators, and Medicare reimbursement for oxygen. Explanation of Pulmonary Drugs, including class,  frequency, complications, importance of spacers, rinsing mouth after steroid MDI's, and proper cleaning methods for nebulizers. Review of basic lung anatomy and physiology related to function, structure, and complications of lung disease. Review of risk factors. Discussion about methods for diagnosing sleep apnea and types of masks and machines for OSA. Includes a review of the use of types of environmental controls: home humidity, furnaces, filters, dust mite/pet prevention, HEPA vacuums. Discussion about weather changes, air quality and the benefits of nasal washing. Instruction on Warning signs, infection symptoms, calling MD promptly, preventive modes, and value of vaccinations. Review of effective airway clearance, coughing and/or vibration techniques. Emphasizing that all should Create an Action Plan. Written material given at graduation. Flowsheet Row Pulmonary Rehab from 08/10/2018 in [facility] Cardiac and Pulmonary Rehab  Date 06/29/18  Educator [facility]  Instruction Review Code 1- Verbalizes Understanding       AED/CPR: - Group verbal and written instruction with the use of models to demonstrate the basic use of the AED with the basic ABC's of resuscitation.    Anatomy and Cardiac Procedures: - Group verbal and visual presentation and models provide information about basic cardiac anatomy and function. Reviews the testing methods done to diagnose heart disease and the  outcomes of the test results. Describes the treatment choices: Medical Management, Angioplasty, or Coronary Bypass Surgery for treating various heart conditions including Myocardial Infarction, Angina, Valve Disease, and Cardiac Arrhythmias.  Written material given at graduation. Flowsheet Row Pulmonary Rehab from 08/10/2018 in [facility] Cardiac and Pulmonary Rehab  Date 05/16/18  Educator [facility]  Instruction Review Code 5- Refused Teaching       Medication Safety: - Group verbal and visual instruction to review commonly prescribed  medications for heart and lung disease. Reviews the medication, class of the drug, and side effects. Includes the steps to properly store meds and maintain the prescription regimen.  Written material given at graduation. Flowsheet Row Pulmonary Rehab from 08/10/2018 in [facility] Cardiac and Pulmonary Rehab  Date 08/10/18  Educator [facility]  Instruction Review Code 1- Verbalizes Understanding       Other: -Provides group and verbal instruction on various topics (see comments)   Knowledge Questionnaire Score:  Knowledge Questionnaire Score - 08/15/22 1642       Knowledge Questionnaire Score   Pre Score 11/18              Core Components/Risk Factors/Patient Goals at Admission:  Personal Goals and Risk Factors at Admission - 07/12/22 1337       Core Components/Risk Factors/Patient Goals on Admission    Weight Management --    Intervention --    Expected Outcomes --    Improve shortness of breath with ADL's Yes    Intervention Provide education, individualized exercise plan and daily activity instruction to help decrease symptoms of SOB with activities of daily living.    Expected Outcomes Short Term: Improve cardiorespiratory fitness to achieve a reduction of symptoms when performing ADLs;Long Term: Be able to perform more ADLs without symptoms or delay the onset of symptoms    Hypertension Yes    Intervention Provide education on lifestyle modifcations including regular physical activity/exercise, weight management, moderate sodium restriction and increased consumption of fresh fruit, vegetables, and low fat dairy, alcohol moderation, and smoking cessation.;Monitor prescription use compliance.    Expected Outcomes Short Term: Continued assessment and intervention until BP is < 140/37m HG in hypertensive participants. < 130/862mHG in hypertensive participants with diabetes, heart failure or chronic kidney disease.;Long Term: Maintenance of blood pressure at goal levels.    Lipids Yes     Intervention Provide education and support for participant on nutrition & aerobic/resistive exercise along with prescribed medications to achieve LDL <7022mHDL >44m58m  Expected Outcomes Short Term: Participant states understanding of desired cholesterol values and is compliant with medications prescribed. Participant is following exercise prescription and nutrition guidelines.;Long Term: Cholesterol controlled with medications as prescribed, with individualized exercise RX and with personalized nutrition plan. Value goals: LDL < 70mg52mL > 40 mg.             Education:Diabetes - Individual verbal and written instruction to review signs/symptoms of diabetes, desired ranges of glucose level fasting, after meals and with exercise. Acknowledge that pre and post exercise glucose checks will be done for 3 sessions at entry of program.   Know Your Numbers and Heart Failure: - Group verbal and visual instruction to discuss disease risk factors for cardiac and pulmonary disease and treatment options.  Reviews associated critical values for Overweight/Obesity, Hypertension, Cholesterol, and Diabetes.  Discusses basics of heart failure: signs/symptoms and treatments.  Introduces Heart Failure Zone chart for action plan for heart failure.  Written material given at graduation. Flowsheet Row Pulmonary Rehab from  08/10/2022 in [facility] Cardiac and Pulmonary Rehab  Date 07/27/22  Educator SB  Instruction Review Code 1- Verbalizes Understanding       Core Components/Risk Factors/Patient Goals Review:   Goals and Risk Factor Review     Row Name 07/28/22 1127 08/08/22 1114 08/29/22 1125 09/19/22 1137       Core Components/Risk Factors/Patient Goals Review   Personal Goals Review Improve shortness of breath with ADL's Improve shortness of breath with ADL's Improve shortness of breath with ADL's;Weight Management/Obesity Improve shortness of breath with ADL's;Weight Management/Obesity;Increase knowledge of  respiratory medications and ability to use respiratory devices properly.    Review Spoke to patient about their shortness of breath and what they can do to improve. Patient has been informed of breathing techniques when starting the program. Patient is informed to tell staff if they have had any med changes and that certain meds they are taking or not taking can be causing shortness of breath. Spoke to patient about their shortness of breath and what they can do to improve. Patient has been informed of breathing techniques when starting the program. Patient is informed to tell staff if they have had any med changes and that certain meds they are taking or not taking can be causing shortness of breath. Tye Maryland states that she would like to lose a little bit of weight with a goal of 165-170 lbs. She also has been monitoring her O2 saturations at home and reports that they are staying above 95. She also states that she has experienced very little SOB with her ADL's. Tye Maryland has not been checking her BP at home but does have a BP cuff and was encouraged to start monitoring it at home. Tye Maryland is doing well in rehab.  Her weight is starting to come back down.  Her breathing is doing better, she is able to get up and down more and doing more going up the stairs and needing less help from her daughter. She is doing well on her meds.  She has only been using her nebulizer when she needs it but hasn't needed it much.  She had esphogeal spasms recently and has found that peppermint oil helps relieve her symptoms.  She has linked it to either pepper or noddle/rice getting caught.    Expected Outcomes Short: Attend [name] regularly to improve shortness of breath with ADL's. Long: maintain independence with ADL's Short: Attend [name] regularly to improve shortness of breath with ADL's. Long: maintain independence with ADL's Short: Attend [name] regularly to improve shortness of breath with ADL's. Long: Continue to monitor  lifestyle risk factors. Short; COntinue to work on weight loss lOng: conitnue to work on breathing             Core Components/Risk Factors/Patient Goals at Discharge (Final Review):   Goals and Risk Factor Review - 09/19/22 1137       Core Components/Risk Factors/Patient Goals Review   Personal Goals Review Improve shortness of breath with ADL's;Weight Management/Obesity;Increase knowledge of respiratory medications and ability to use respiratory devices properly.    Review Tye Maryland is doing well in rehab.  Her weight is starting to come back down.  Her breathing is doing better, she is able to get up and down more and doing more going up the stairs and needing less help from her daughter. She is doing well on her meds.  She has only been using her nebulizer when she needs it but hasn't needed it much.  She had esphogeal spasms  recently and has found that peppermint oil helps relieve her symptoms.  She has linked it to either pepper or noddle/rice getting caught.    Expected Outcomes Short; COntinue to work on weight loss lOng: conitnue to work on breathing             ITP Comments:  ITP Comments     Row Name 07/12/22 1346 07/20/22 1546 07/25/22 1012 08/17/22 0942 09/14/22 0810   ITP Comments Initial telephone orientation completed. Diagnosis can be found in [facility] 8/16. EP orientation scheduled for Wednesday 10/4 at 2:30. Patient presented for orientation today, 10/4. Patient wore open-toed shoes and therefore could not complete 6MWT. Will do 6MWT on Monday, 10/9. First full day of exercise!  Patient was oriented to gym and equipment including functions, settings, policies, and procedures.  Patient's individual exercise prescription and treatment plan were reviewed.  All starting workloads were established based on the results of the 6 minute walk test done at initial orientation visit.  The plan for exercise progression was also introduced and progression will be customized based on patient's  performance and goals. 30 Day review completed. Medical Director ITP review done, changes made as directed, and signed approval by Medical Director.   NEW TO PROGRAM 30 Day review completed. Medical Director ITP review done, changes made as directed, and signed approval by Medical Director.    Perry Name 10/12/22 1023           ITP Comments 30 Day review completed. Medical Director ITP review done, changes made as directed, and signed approval by Medical Director.                Comments:

## 2022-10-13 ENCOUNTER — Telehealth: Payer: Self-pay | Admitting: *Deleted

## 2022-10-13 ENCOUNTER — Encounter: Payer: BC Managed Care – PPO | Admitting: *Deleted

## 2022-10-13 NOTE — Telephone Encounter (Signed)
Called to check on patient.  She has been out sick.    It has gone through the whole family.  She is feeling better and hopes to return soon.

## 2022-10-19 ENCOUNTER — Encounter: Payer: BC Managed Care – PPO | Attending: Pulmonary Disease | Admitting: *Deleted

## 2022-10-19 DIAGNOSIS — G4733 Obstructive sleep apnea (adult) (pediatric): Secondary | ICD-10-CM | POA: Diagnosis not present

## 2022-10-19 DIAGNOSIS — D860 Sarcoidosis of lung: Secondary | ICD-10-CM | POA: Insufficient documentation

## 2022-10-19 DIAGNOSIS — I10 Essential (primary) hypertension: Secondary | ICD-10-CM | POA: Diagnosis not present

## 2022-10-20 ENCOUNTER — Encounter: Payer: BC Managed Care – PPO | Admitting: *Deleted

## 2022-10-20 DIAGNOSIS — I10 Essential (primary) hypertension: Secondary | ICD-10-CM | POA: Diagnosis not present

## 2022-10-20 DIAGNOSIS — G4733 Obstructive sleep apnea (adult) (pediatric): Secondary | ICD-10-CM | POA: Diagnosis not present

## 2022-10-24 ENCOUNTER — Encounter: Payer: BC Managed Care – PPO | Admitting: *Deleted

## 2022-10-24 DIAGNOSIS — D860 Sarcoidosis of lung: Secondary | ICD-10-CM | POA: Diagnosis not present

## 2022-10-24 NOTE — Progress Notes (Signed)
Daily Session Note  Patient Details  Name: Stacie [name] MRN: [id number] Date of Birth: 1956/04/11 Referring Provider:   Flowsheet Row Pulmonary Rehab from 07/25/2022 in [facility] Cardiac and Pulmonary Rehab  Referring Provider Ottie Glazier, MD       Encounter Date: 10/24/2022  Check In:  Session Check In - 10/24/22 1136       Check-In   Supervising physician immediately available to respond to emergencies See telemetry face sheet for immediately available ER MD    Location [facility]-Cardiac & Pulmonary Rehab    Staff Present Darlyne Russian, RN, Doyce Para, BS, ACSM CEP, Exercise Physiologist;Meredith Sherryll Burger, RN BSN;Noah Tickle, BS, Exercise Physiologist    Virtual Visit No    Medication changes reported     No    Fall or balance concerns reported    No    Warm-up and Cool-down Performed on first and last piece of equipment    Resistance Training Performed Yes    VAD Patient? No    PAD/SET Patient? No      Pain Assessment   Currently in Pain? No/denies                Social History   Tobacco Use  Smoking Status Never  Smokeless Tobacco Never    Goals Met:  Independence with exercise equipment Exercise tolerated well No report of concerns or symptoms today Strength training completed today  Goals Unmet:  Not Applicable  Comments: Pt able to follow exercise prescription today without complaint.  Will continue to monitor for progression.    Dr. Emily Filbert is Medical Director for [place].  Dr. Ottie Glazier is Medical Director for [facility] Pulmonary Rehabilitation.

## 2022-10-26 ENCOUNTER — Ambulatory Visit: Payer: BC Managed Care – PPO | Admitting: *Deleted

## 2022-10-26 DIAGNOSIS — D869 Sarcoidosis, unspecified: Secondary | ICD-10-CM | POA: Diagnosis not present

## 2022-10-27 ENCOUNTER — Encounter: Payer: BC Managed Care – PPO | Admitting: *Deleted

## 2022-10-31 ENCOUNTER — Encounter: Payer: BC Managed Care – PPO | Admitting: *Deleted

## 2022-10-31 DIAGNOSIS — D860 Sarcoidosis of lung: Secondary | ICD-10-CM

## 2022-10-31 NOTE — Progress Notes (Signed)
Daily Session Note  Patient Details  Name: Stacie [name] MRN: [id number] Date of Birth: Jul 16, 1956 Referring Provider:   Flowsheet Row Pulmonary Rehab from 07/25/2022 in [facility] Cardiac and Pulmonary Rehab  Referring Provider Ottie Glazier, MD       Encounter Date: 10/31/2022  Check In:  Session Check In - 10/31/22 1124       Check-In   Supervising physician immediately available to respond to emergencies See telemetry face sheet for immediately available ER MD    Location [facility]-Cardiac & Pulmonary Rehab    Staff Present Renita Papa, RN BSN;Noah Tickle, BS, Exercise Physiologist;Kelly Amedeo Plenty, BS, ACSM CEP, Exercise Physiologist;Zakee Deerman Tamala Julian, RN, ADN    Virtual Visit No    Medication changes reported     No    Fall or balance concerns reported    No    Warm-up and Cool-down Performed on first and last piece of equipment    Resistance Training Performed Yes    VAD Patient? No    PAD/SET Patient? No      Pain Assessment   Currently in Pain? No/denies                Social History   Tobacco Use  Smoking Status Never  Smokeless Tobacco Never    Goals Met:  Independence with exercise equipment Exercise tolerated well No report of concerns or symptoms today Strength training completed today  Goals Unmet:  Not Applicable  Comments: Pt able to follow exercise prescription today without complaint.  Will continue to monitor for progression.    Dr. Emily Filbert is Medical Director for [place].  Dr. Ottie Glazier is Medical Director for [facility] Pulmonary Rehabilitation.

## 2022-11-02 ENCOUNTER — Encounter: Payer: BC Managed Care – PPO | Admitting: *Deleted

## 2022-11-07 ENCOUNTER — Encounter: Payer: BC Managed Care – PPO | Admitting: *Deleted

## 2022-11-09 ENCOUNTER — Encounter: Payer: Self-pay | Admitting: *Deleted

## 2022-11-09 ENCOUNTER — Encounter: Payer: BC Managed Care – PPO | Admitting: *Deleted

## 2022-11-09 DIAGNOSIS — D860 Sarcoidosis of lung: Secondary | ICD-10-CM

## 2022-11-09 NOTE — Progress Notes (Signed)
Pulmonary Individual Treatment Plan  Patient Details  Name: Stacie [name] MRN: [id number] Date of Birth: 1956/06/13 Referring Provider:   Flowsheet Row Pulmonary Rehab from 07/25/2022 in [facility] Cardiac and Pulmonary Rehab  Referring Provider Ottie Glazier, MD       Initial Encounter Date:  Flowsheet Row Pulmonary Rehab from 07/25/2022 in [facility] Cardiac and Pulmonary Rehab  Date 07/25/22       Visit Diagnosis: Sarcoidosis of lung ([place])  Patient's Home Medications on Admission:  Current Outpatient Medications:    acetaminophen (TYLENOL) 500 MG tablet, Take 500 mg by mouth every 6 (six) hours as needed for moderate pain or headache., Disp: , Rfl:    denosumab (PROLIA) 60 MG/ML SOSY injection, Inject 60 mg into the skin every 6 (six) months., Disp: , Rfl:    dorzolamide (TRUSOPT) 2 % ophthalmic solution, Place 1 drop into both eyes 2 (two) times daily., Disp: , Rfl:    escitalopram (LEXAPRO) 20 MG tablet, Take 1 tablet (20 mg total) by mouth daily., Disp: 90 tablet, Rfl: 3   Fluticasone-Umeclidin-Vilant (TRELEGY ELLIPTA) 200-62.5-25 MCG/ACT AEPB, Inhale into the lungs., Disp: , Rfl:    latanoprost (XALATAN) 0.005 % ophthalmic solution, Place 1 drop into the right eye 2 (two) times daily., Disp: , Rfl:    levothyroxine (SYNTHROID) 137 MCG tablet, Take 1 tablet (137 mcg total) by mouth daily before breakfast., Disp: 90 tablet, Rfl: 3   Menthol (ICY HOT) 5 % PTCH, Apply 1 patch topically daily as needed (pain)., Disp: , Rfl:    Multiple Vitamins-Minerals (MULTIVITAMIN WITH MINERALS) tablet, Take 1 tablet by mouth daily., Disp: , Rfl:    naproxen sodium (ALEVE) 220 MG tablet, Take 220 mg by mouth daily as needed (pain)., Disp: , Rfl:    NON FORMULARY, CPAP nightly, Disp: , Rfl:    OXYGEN, Inhale 2 L into the lungs daily as needed (During walking and activity). , Disp: , Rfl:    predniSONE (DELTASONE) 10 MG tablet, Take 2 tablets (20 mg total) by mouth daily. Resume only after the finishing  new Rx for prednisone, Disp: , Rfl:    Probiotic Product (PROBIOTIC PO), Take 1 capsule by mouth daily., Disp: , Rfl:    Treprostinil (TYVASO) 0.6 MG/ML SOLN, Inhale into the lungs See admin instructions. 12 breaths 4 times a day, Disp: , Rfl:   Past Medical History: Past Medical History:  Diagnosis Date   Acute renal failure ([place])    Acute respiratory failure ([place]) 10/23/2021   Acute respiratory failure with hypoxia (H[facility])    ARF (acute respiratory failure) ([place]) 10/19/2021   Arrhythmia    patient unaware if this is current   Asthma    Chronic kidney disease    Critical lower limb ischemia (HCC)    Depression    GERD (gastroesophageal reflux disease)    Heart murmur    History of kidney stones    HOH (hard of hearing)    wear aids   Hyperthyroidism    Hypothyroidism    IBS (irritable bowel syndrome)    Pneumonia    Pulmonary hypertension ([place])    Sarcoid    Sarcoidosis    Seasonal allergies    Sleep apnea CPAP with O2   Stroke ([place]) 07/2020   watershed   Wears hearing aid in both ears     Tobacco Use: Social History   Tobacco Use  Smoking Status Never  Smokeless Tobacco Never    Labs: Review Flowsheet  More data exists  Latest Ref Rng & Units 07/16/2020 07/21/2020 01/14/2021 10/19/2021 06/24/2022  Labs for ITP Cardiac and Pulmonary Rehab  Cholestrol 0 - 200 mg/dL - 289  - - -  LDL (calc) 0 - 99 mg/dL - 198  - - -  HDL-C >40 mg/dL - 33  - - -  Trlycerides <150 mg/dL - 290  - - -  Hemoglobin A1c 4.8 - 5.6 % 6.4  - - - 6.1   PH, Arterial 7.350 - 7.450 7.41  - - - -  PCO2 arterial 32.0 - 48.0 mmHg 52  - - - -  Bicarbonate 20.0 - 28.0 mmol/L 33.0  - - 31.7  -  TCO2 22 - 32 mmol/L - - 30  - -  O2 Saturation % 95.2  - - 33.6  -     Pulmonary Assessment Scores:  Pulmonary Assessment Scores     Row Name 07/25/22 1331 08/15/22 1643       ADL UCSD   ADL Phase -- Entry    SOB Score total -- 88    Rest -- 0    Walk -- 3    Stairs -- 5    Bath -- 5    Dress -- 5     Shop -- 5      CAT Score   CAT Score -- 21      mMRC Score   mMRC Score 4 --             UCSD: Self-administered rating of dyspnea associated with activities of daily living (ADLs) 6-point scale (0 = "not at all" to 5 = "maximal or unable to do because of breathlessness")  Scoring Scores range from 0 to 120.  Minimally important difference is 5 units  CAT: CAT can identify the health impairment of COPD patients and is better correlated with disease progression.  CAT has a scoring range of zero to 40. The CAT score is classified into four groups of low (less than 10), medium (10 - 20), high (21-30) and very high (31-40) based on the impact level of disease on health status. A CAT score over 10 suggests significant symptoms.  A worsening CAT score could be explained by an exacerbation, poor medication adherence, poor inhaler technique, or progression of COPD or comorbid conditions.  CAT MCID is 2 points  mMRC: mMRC (Modified Medical Research Council) Dyspnea Scale is used to assess the degree of baseline functional disability in patients of respiratory disease due to dyspnea. No minimal important difference is established. A decrease in score of 1 point or greater is considered a positive change.   Pulmonary Function Assessment:   Exercise Target Goals: Exercise Program Goal: Individual exercise prescription set using results from initial 6 min walk test and THRR while considering  patient's activity barriers and safety.   Exercise Prescription Goal: Initial exercise prescription builds to 30-45 minutes a day of aerobic activity, 2-3 days per week.  Home exercise guidelines will be given to patient during program as part of exercise prescription that the participant will acknowledge.  Education: Aerobic Exercise: - Group verbal and visual presentation on the components of exercise prescription. Introduces F.I.T.T principle from ACSM for exercise prescriptions.  Reviews  F.I.T.T. principles of aerobic exercise including progression. Written material given at graduation. Flowsheet Row Pulmonary Rehab from 08/10/2018 in [facility] Cardiac and Pulmonary Rehab  Date 05/02/18  Educator [facility]  Instruction Review Code 1- Verbalizes Understanding       Education: Resistance Exercise: - Group verbal  and visual presentation on the components of exercise prescription. Introduces F.I.T.T principle from ACSM for exercise prescriptions  Reviews F.I.T.T. principles of resistance exercise including progression. Written material given at graduation.    Education: Exercise & Equipment Safety: - Individual verbal instruction and demonstration of equipment use and safety with use of the equipment. Flowsheet Row Pulmonary Rehab from 08/10/2022 in [facility] Cardiac and Pulmonary Rehab  Date 07/25/22  Educator NT  Instruction Review Code 1- Verbalizes Understanding       Education: Exercise Physiology & General Exercise Guidelines: - Group verbal and written instruction with models to review the exercise physiology of the cardiovascular system and associated critical values. Provides general exercise guidelines with specific guidelines to those with heart or lung disease.  Flowsheet Row Pulmonary Rehab from 08/10/2018 in [facility] Cardiac and Pulmonary Rehab  Date 07/11/18  Educator [facility]  Instruction Review Code 1- Verbalizes Understanding       Education: Flexibility, Balance, Mind/Body Relaxation: - Group verbal and visual presentation with interactive activity on the components of exercise prescription. Introduces F.I.T.T principle from ACSM for exercise prescriptions. Reviews F.I.T.T. principles of flexibility and balance exercise training including progression. Also discusses the mind body connection.  Reviews various relaxation techniques to help reduce and manage stress (i.e. Deep breathing, progressive muscle relaxation, and visualization). Balance handout provided to take home.  Written material given at graduation. Flowsheet Row Pulmonary Rehab from 08/10/2018 in [facility] Cardiac and Pulmonary Rehab  Date 08/08/18  Educator AS  Instruction Review Code 1- Verbalizes Understanding       Activity Barriers & Risk Stratification:  Activity Barriers & Cardiac Risk Stratification - 07/25/22 1310       Activity Barriers & Cardiac Risk Stratification   Activity Barriers Other (comment);Shortness of Breath    Comments stroke 2021- left foot 3 toes amputated; broke elbow after tripping over a cord ("healed")             6 Minute Walk:  6 Minute Walk     Row Name 07/25/22 1306         6 Minute Walk   Phase Initial     Distance 705 feet     Walk Time 5.83 minutes     # of Rest Breaks 1     MPH 1.37     METS 2.25     RPE 15     Perceived Dyspnea  4     VO2 Peak 7.88     Symptoms Yes (comment)     Comments SOB     Resting HR 116 bpm     Resting BP 124/66     Resting Oxygen Saturation  91 %     Exercise Oxygen Saturation  during 6 min walk 84 %     Max Ex. HR 133 bpm     Max Ex. BP 140/80     2 Minute Post BP 128/72       Interval HR   1 Minute HR 116     2 Minute HR 127     3 Minute HR 129     4 Minute HR 127     5 Minute HR 133     6 Minute HR 128     2 Minute Post HR 119     Interval Heart Rate? Yes       Interval Oxygen   Interval Oxygen? Yes     Baseline Oxygen Saturation % 91 %     1 Minute Oxygen Saturation %  91 %     1 Minute Liters of Oxygen 5 L  Pulsed     2 Minute Oxygen Saturation % 90 %     2 Minute Liters of Oxygen 5 L  Pulsed     3 Minute Oxygen Saturation % 84 %     3 Minute Liters of Oxygen 5 L  Pulsed     4 Minute Oxygen Saturation % 84 %     4 Minute Liters of Oxygen 5 L  Pulsed     5 Minute Oxygen Saturation % 86 %     5 Minute Liters of Oxygen 5 L  Pulsed     6 Minute Oxygen Saturation % 85 %     6 Minute Liters of Oxygen 5 L  Pulsed     2 Minute Post Oxygen Saturation % 90 %     2 Minute Post Liters of Oxygen 5 L   Pulsed             Oxygen Initial Assessment:  Oxygen Initial Assessment - 07/12/22 1334       Home Oxygen   Home Oxygen Device Portable Concentrator;Home Concentrator    Sleep Oxygen Prescription CPAP    Liters per minute 2    Home Exercise Oxygen Prescription Continuous    Liters per minute 2    Home Resting Oxygen Prescription Continuous   Doctor told her she could use prn during rest   Liters per minute 2    Compliance with Home Oxygen Use Yes      Intervention   Short Term Goals To learn and exhibit compliance with exercise, home and travel O2 prescription;To learn and understand importance of monitoring SPO2 with pulse oximeter and demonstrate accurate use of the pulse oximeter.;To learn and understand importance of maintaining oxygen saturations>88%;To learn and demonstrate proper pursed lip breathing techniques or other breathing techniques. ;To learn and demonstrate proper use of respiratory medications    Long  Term Goals Exhibits compliance with exercise, home  and travel O2 prescription;Verbalizes importance of monitoring SPO2 with pulse oximeter and return demonstration;Maintenance of O2 saturations>88%;Exhibits proper breathing techniques, such as pursed lip breathing or other method taught during program session;Compliance with respiratory medication;Demonstrates proper use of MDI's             Oxygen Re-Evaluation:  Oxygen Re-Evaluation     Row Name 07/25/22 1015 08/29/22 1131 09/19/22 1141         Program Oxygen Prescription   Program Oxygen Prescription -- Continuous Continuous     Liters per minute -- 2 2       Home Oxygen   Home Oxygen Device -- Portable Concentrator;Home Concentrator Portable Concentrator;Home Concentrator     Sleep Oxygen Prescription -- CPAP CPAP     Liters per minute -- 2 2     Home Exercise Oxygen Prescription -- Continuous Continuous     Liters per minute -- 2 2     Home Resting Oxygen Prescription -- Continuous Continuous      Liters per minute -- 2 2     Compliance with Home Oxygen Use -- Yes Yes       Goals/Expected Outcomes   Short Term Goals -- To learn and exhibit compliance with exercise, home and travel O2 prescription;To learn and understand importance of monitoring SPO2 with pulse oximeter and demonstrate accurate use of the pulse oximeter.;To learn and understand importance of maintaining oxygen saturations>88%;To learn and demonstrate proper pursed lip breathing techniques or other  breathing techniques. ;To learn and demonstrate proper use of respiratory medications To learn and exhibit compliance with exercise, home and travel O2 prescription;To learn and understand importance of monitoring SPO2 with pulse oximeter and demonstrate accurate use of the pulse oximeter.;To learn and understand importance of maintaining oxygen saturations>88%;To learn and demonstrate proper pursed lip breathing techniques or other breathing techniques. ;To learn and demonstrate proper use of respiratory medications     Long  Term Goals -- Exhibits compliance with exercise, home  and travel O2 prescription;Verbalizes importance of monitoring SPO2 with pulse oximeter and return demonstration;Maintenance of O2 saturations>88%;Exhibits proper breathing techniques, such as pursed lip breathing or other method taught during program session;Compliance with respiratory medication;Demonstrates proper use of MDI's Exhibits compliance with exercise, home  and travel O2 prescription;Verbalizes importance of monitoring SPO2 with pulse oximeter and return demonstration;Maintenance of O2 saturations>88%;Exhibits proper breathing techniques, such as pursed lip breathing or other method taught during program session;Compliance with respiratory medication;Demonstrates proper use of MDI's     Comments Reviewed PLB technique with pt.  Talked about how it works and it's importance in maintaining their exercise saturations. Reviewed PLB technique with pt.   Talked about how it works and it's importance in maintaining their exercise saturations. Tye [place] has a pulse oximeter at home and has been checking her O2 saturations. When she walks at home she reports that she does experience desaturation but she pauses to let her oxygen come back to normal levels. Tye [place] is doing well in rehab.  She is doing well on her oxgygen at home and saturations are staying up.  She is doing well with her CPAP but has started a new mask type that is not fitting as well.  She may go back to other form.  She is feeling good with her breathing and using her PLB consistently.     Goals/Expected Outcomes Short: Become more profiecient at using PLB.   Long: Become independent at using PLB. Short: Become more profiecient at using PLB.   Long: Become independent at using PLB. Short: Check on CPAP mask again Long: Conitnue to use PLB              Oxygen Discharge (Final Oxygen Re-Evaluation):  Oxygen Re-Evaluation - 09/19/22 1141       Program Oxygen Prescription   Program Oxygen Prescription Continuous    Liters per minute 2      Home Oxygen   Home Oxygen Device Portable Concentrator;Home Concentrator    Sleep Oxygen Prescription CPAP    Liters per minute 2    Home Exercise Oxygen Prescription Continuous    Liters per minute 2    Home Resting Oxygen Prescription Continuous    Liters per minute 2    Compliance with Home Oxygen Use Yes      Goals/Expected Outcomes   Short Term Goals To learn and exhibit compliance with exercise, home and travel O2 prescription;To learn and understand importance of monitoring SPO2 with pulse oximeter and demonstrate accurate use of the pulse oximeter.;To learn and understand importance of maintaining oxygen saturations>88%;To learn and demonstrate proper pursed lip breathing techniques or other breathing techniques. ;To learn and demonstrate proper use of respiratory medications    Long  Term Goals Exhibits compliance with exercise, home  and  travel O2 prescription;Verbalizes importance of monitoring SPO2 with pulse oximeter and return demonstration;Maintenance of O2 saturations>88%;Exhibits proper breathing techniques, such as pursed lip breathing or other method taught during program session;Compliance with respiratory medication;Demonstrates proper use of MDI's  Comments Tye [place] is doing well in rehab.  She is doing well on her oxgygen at home and saturations are staying up.  She is doing well with her CPAP but has started a new mask type that is not fitting as well.  She may go back to other form.  She is feeling good with her breathing and using her PLB consistently.    Goals/Expected Outcomes Short: Check on CPAP mask again Long: Conitnue to use PLB             Initial Exercise Prescription:  Initial Exercise Prescription - 07/27/22 1100       Intensity   THRR 40-80% of Max Heartrate 118 - 141   New   Ratings of Perceived Exertion 11-13    Perceived Dyspnea 0-4             Perform Capillary Blood Glucose checks as needed.  Exercise Prescription Changes:   Exercise Prescription Changes     Row Name 07/25/22 1300 07/27/22 1100 08/09/22 1400 08/22/22 1100 09/20/22 1500     Response to Exercise   Blood Pressure (Admit) 124/66 -- 120/62 130/70 132/74   Blood Pressure (Exercise) 140/80 -- 138/76 138/62 128/72   Blood Pressure (Exit) 128/72 -- 140/82 134/72 122/64   Heart Rate (Admit) 116 bpm -- 97 bpm 96 bpm 103 bpm   Heart Rate (Exercise) 133 bpm -- 127 bpm 119 bpm 121 bpm   Heart Rate (Exit) 119 bpm -- 109 bpm 104 bpm 105 bpm   Oxygen Saturation (Admit) 91 % -- 96 % 95 % 97 %   Oxygen Saturation (Exercise) 84 % -- 89 % 91 % 89 %   Oxygen Saturation (Exit) 90 % -- 96 % 95 % 95 %   Rating of Perceived Exertion (Exercise) 14 -- _0 Perceived Dyspnea (Exercise) 4 -- _1 Symptoms SOB -- SOB SOB SOB   Comments 6MWT Results -- First 3 sessions in rehab -- --   Duration -- -- Continue with 30 min of  aerobic exercise without signs/symptoms of physical distress. Continue with 30 min of aerobic exercise without signs/symptoms of physical distress. Continue with 30 min of aerobic exercise without signs/symptoms of physical distress.   Intensity -- THRR New  Changed 118-141 THRR unchanged THRR unchanged THRR unchanged     Progression   Progression -- -- Continue to progress workloads to maintain intensity without signs/symptoms of physical distress. Continue to progress workloads to maintain intensity without signs/symptoms of physical distress. Continue to progress workloads to maintain intensity without signs/symptoms of physical distress.   Average METs -- -- 1.9 2.07 2.03     Resistance Training   Training Prescription -- -- Yes Yes Yes   Weight -- -- 3 lb 3 lb 3 lb   Reps -- -- 10-15 10-15 10-15     Interval Training   Interval Training -- -- No No No     Oxygen   Oxygen -- -- Continuous Continuous Continuous   Liters -- -- 3 3 3-4     Treadmill   MPH -- 1.5 1.1 1.2 1   Grade -- 0 0 0 0   Minutes -- _2 METs -- 2.15 1.84 1.92 1.8     Recumbant Bike   Level -- -- -- -- 1.2   Watts -- -- -- -- 15   Minutes -- -- -- -- 15   METs -- -- -- -- 2.54  NuStep   Level -- -- -- 1 4   Minutes -- -- -- 15 15   METs -- -- -- 2 1.7     REL-XR   Level -- -- _0 Minutes -- -- _1 METs -- -- 2.5 2.9 2.4     T5 Nustep   Level -- -- 1 1 --   Minutes -- -- 15 15 --   METs -- -- 1.6 2 --     Oxygen   Maintain Oxygen Saturation -- -- 88% or higher 88% or higher 88% or higher    Row Name 10/04/22 1500 11/01/22 0800           Response to Exercise   Blood Pressure (Admit) 140/70 140/68      Blood Pressure (Exit) 124/70 132/62      Heart Rate (Admit) 107 bpm 104 bpm      Heart Rate (Exercise) 119 bpm 110 bpm      Heart Rate (Exit) 116 bpm 101 bpm      Oxygen Saturation (Admit) 94 % 90 %      Oxygen Saturation (Exercise) 89 % 93 %      Oxygen Saturation  (Exit) 95 % 93 %      Rating of Perceived Exertion (Exercise) 12 14      Perceived Dyspnea (Exercise) 3 2      Symptoms SOB SOB      Duration Continue with 30 min of aerobic exercise without signs/symptoms of physical distress. Continue with 30 min of aerobic exercise without signs/symptoms of physical distress.      Intensity THRR unchanged THRR unchanged        Progression   Progression Continue to progress workloads to maintain intensity without signs/symptoms of physical distress. Continue to progress workloads to maintain intensity without signs/symptoms of physical distress.      Average METs 2.28 1.8        Resistance Training   Training Prescription Yes Yes      Weight 3 lb 3 lb      Reps 10-15 10-15        Interval Training   Interval Training No No        Oxygen   Oxygen Continuous Continuous      Liters 4 4        Treadmill   MPH 1.2 --      Grade 0 --      Minutes 15 --      METs 1.92 --        NuStep   Level 3 1      Minutes 15 15      METs 2 1.5        REL-XR   Level 1 1      Minutes 15 15      METs 3.2 2.1        Biostep-RELP   Level 1 --      Minutes 15 --      METs 2 --        Oxygen   Maintain Oxygen Saturation 88% or higher 88% or higher               Exercise Comments:   Exercise Comments     Row Name 07/25/22 1012           Exercise Comments First full day of exercise!  Patient was oriented to gym and equipment including functions, settings, policies,  and procedures.  Patient's individual exercise prescription and treatment plan were reviewed.  All starting workloads were established based on the results of the 6 minute walk test done at initial orientation visit.  The plan for exercise progression was also introduced and progression will be customized based on patient's performance and goals.                Exercise Goals and Review:   Exercise Goals     Row Name 07/25/22 1330             Exercise Goals   Increase  Physical Activity Yes       Intervention Provide advice, education, support and counseling about physical activity/exercise needs.;Develop an individualized exercise prescription for aerobic and resistive training based on initial evaluation findings, risk stratification, comorbidities and participant's personal goals.       Expected Outcomes Short Term: Attend rehab on a regular basis to increase amount of physical activity.;Long Term: Add in home exercise to make exercise part of routine and to increase amount of physical activity.;Long Term: Exercising regularly at least 3-5 days a week.       Increase Strength and Stamina Yes       Intervention Provide advice, education, support and counseling about physical activity/exercise needs.;Develop an individualized exercise prescription for aerobic and resistive training based on initial evaluation findings, risk stratification, comorbidities and participant's personal goals.       Expected Outcomes Short Term: Increase workloads from initial exercise prescription for resistance, speed, and METs.;Short Term: Perform resistance training exercises routinely during rehab and add in resistance training at home;Long Term: Improve cardiorespiratory fitness, muscular endurance and strength as measured by increased METs and functional capacity (6MWT)       Able to understand and use rate of perceived exertion (RPE) scale Yes       Intervention Provide education and explanation on how to use RPE scale       Expected Outcomes Short Term: Able to use RPE daily in rehab to express subjective intensity level;Long Term:  Able to use RPE to guide intensity level when exercising independently       Able to understand and use Dyspnea scale Yes       Intervention Provide education and explanation on how to use Dyspnea scale       Expected Outcomes Short Term: Able to use Dyspnea scale daily in rehab to express subjective sense of shortness of breath during exertion;Long Term:  Able to use Dyspnea scale to guide intensity level when exercising independently       Knowledge and understanding of Target Heart Rate Range (THRR) Yes       Intervention Provide education and explanation of THRR including how the numbers were predicted and where they are located for reference       Expected Outcomes Short Term: Able to state/look up THRR;Short Term: Able to use daily as guideline for intensity in rehab;Long Term: Able to use THRR to govern intensity when exercising independently       Able to check pulse independently Yes       Intervention Provide education and demonstration on how to check pulse in carotid and radial arteries.;Review the importance of being able to check your own pulse for safety during independent exercise       Expected Outcomes Short Term: Able to explain why pulse checking is important during independent exercise;Long Term: Able to check pulse independently and accurately       Understanding  of Exercise Prescription Yes       Intervention Provide education, explanation, and written materials on patient's individual exercise prescription       Expected Outcomes Short Term: Able to explain program exercise prescription;Long Term: Able to explain home exercise prescription to exercise independently                Exercise Goals Re-Evaluation :  Exercise Goals Re-Evaluation     [place] Name 07/25/22 1012 08/08/22 1108 08/09/22 1419 08/22/22 1200 08/29/22 1115     Exercise Goal Re-Evaluation   Exercise Goals Review Able to understand and use rate of perceived exertion (RPE) scale;Able to understand and use Dyspnea scale;Knowledge and understanding of Target Heart Rate Range (THRR);Understanding of Exercise Prescription Understanding of Exercise Prescription;Increase Physical Activity;Increase Strength and Stamina Understanding of Exercise Prescription;Increase Physical Activity;Increase Strength and Stamina Understanding of Exercise Prescription;Increase  Physical Activity;Increase Strength and Stamina Understanding of Exercise Prescription;Increase Physical Activity;Increase Strength and Stamina   Comments Reviewed RPE scale, THR and program prescription with pt today.  Pt voiced understanding and was given a copy of goals to take home. Tye Maryland feels that she is doing well with exercise. She states that she feels stronger since starting the program. She stated that she has not seen a huge benefit in her breathing but she wants to continue to work towards that goal. She does also state that she felt over exerted last week, but states it could have been due to being ill. We will continue to monitor her progress in the program. Tye Maryland is doing well in rehab. Her average overall MET level after her first 3 sessions was 1.9 METs. She also has done well walking on the treadmill at 1.1 mph without desaturating. She has tolerated 3 lb hand weights for resistance training as well. We will continue to monitor her progress in the program. Tye Maryland continues to do well in rehab. She was able to get her speed on the treadmill to 1.2 mph, though walking is challenging for her. She has not increased her level on other seated machines but all are reflecting appropriate RPEs. She should work toward increasing to level 2 on the T4 Nustep. Oxygen saturations are staying above 88%. Will continue to monitor. Tye Maryland is doing well in rehab. She states that she has more energy since starting the program. She has been walking on the treadmill at a speed of 1.2 mph, but has had some trouble with her oxygen saturation dropping below 88. Tye Maryland has been doing some walking at home  about 2-3 times a week as well, and feels she has seen some improvement there as well. We will continue to monitor her progress in the program.   Expected Outcomes Short: Use RPE daily to regulate intensity.  Long: Follow program prescription in THR. Short: Continue to follow current exercise prescription. Long: Continue  to increase strength and stamina. Short: Continue to increase workloads as tolerated. Long: Continue to increase strength and stamina. Short: Increase to level 2 on T4 Nustep. Long: Continue to increase overall MET level Short: Continue to walk at home on days away from rehab. Long: Continue to increase strength and stamina.    [place] Name 09/19/22 1127 09/20/22 1503 10/04/22 1505 10/19/22 1124 11/01/22 0852     Exercise Goal Re-Evaluation   Exercise Goals Review Understanding of Exercise Prescription;Increase Physical Activity;Increase Strength and Stamina Understanding of Exercise Prescription;Increase Physical Activity;Increase Strength and Stamina Understanding of Exercise Prescription;Increase Physical Activity;Increase Strength and Stamina Understanding of Exercise Prescription;Increase Physical  Activity;Increase Strength and Stamina Understanding of Exercise Prescription;Increase Physical Activity;Increase Strength and Stamina   Comments Tye Maryland is doing well in rehab.  She feels good with her exercise in class.  She is walking on deck at home on her off days.  She is also using her pedal machine at home.  She has been doing her stairs more at home.  She is feeling stronger overall and feeling better. Tye Maryland has been having intermittent attendance due to being sick. Once returned, she was able to increase to level 4 on the T4 Nustep. She would benefit from increasing her level on the XR. Perhaps we can also add a small incline to her TM as her speed has been consistent. Oxygen saturations are staying above 88%. Will continue to monitor. Tye Maryland is doing well in the program. She recently increased her overall average MET level of 2.28 METs. She also was able to increase her speed back up to 1.2 mph on the treadmill. She also has kept her oxygen saturations above 88% since she began using 3L of O2 for exercise. We will continue to monitor her progress in the program. Tye Maryland has not attended since last review. She  has been out sick and staff will continue to follow up with patient on when she is feeling better and able to come back. Tye Maryland has only attended rehab once since the last review. She returned from being sick and her workloads on both the T4 and XR were both down to level 1. She also was not able to do any walking on the treadmill during her first session back in rehab. We will continue to monitor her progress as she returns to consistent attendance.   Expected Outcomes Short: Conitnue to add in more walking at home Long: conitnue to improve stamina. Short: Increase level on XR Long: Continue to increase overall MET level Short: Increase level on XR. Long: Continue to improve strength and stamina. Short: Return to rehab and maintain good attendance Long: Graduate from [employer] Short: Increase workloads back up to the levels prior to sickness. Long: Continue to improve strength and stamina.            Discharge Exercise Prescription (Final Exercise Prescription Changes):  Exercise Prescription Changes - 11/01/22 0800       Response to Exercise   Blood Pressure (Admit) 140/68    Blood Pressure (Exit) 132/62    Heart Rate (Admit) 104 bpm    Heart Rate (Exercise) 110 bpm    Heart Rate (Exit) 101 bpm    Oxygen Saturation (Admit) 90 %    Oxygen Saturation (Exercise) 93 %    Oxygen Saturation (Exit) 93 %    Rating of Perceived Exertion (Exercise) 14    Perceived Dyspnea (Exercise) 2    Symptoms SOB    Duration Continue with 30 min of aerobic exercise without signs/symptoms of physical distress.    Intensity THRR unchanged      Progression   Progression Continue to progress workloads to maintain intensity without signs/symptoms of physical distress.    Average METs 1.8      Resistance Training   Training Prescription Yes    Weight 3 lb    Reps 10-15      Interval Training   Interval Training No      Oxygen   Oxygen Continuous    Liters 4      NuStep   Level 1    Minutes  15    METs 1.5  REL-XR   Level 1    Minutes 15    METs 2.1      Oxygen   Maintain Oxygen Saturation 88% or higher             Nutrition:  Target Goals: Understanding of nutrition guidelines, daily intake of sodium <1535m, cholesterol <2050m calories 30% from fat and 7% or less from saturated fats, daily to have 5 or more servings of fruits and vegetables.  Education: All About Nutrition: -Group instruction provided by verbal, written material, interactive activities, discussions, models, and posters to present general guidelines for heart healthy nutrition including fat, fiber, MyPlate, the role of sodium in heart healthy nutrition, utilization of the nutrition label, and utilization of this knowledge for meal planning. Follow up email sent as well. Written material given at graduation. [facility]  Date 06/27/18  Educator LB  Instruction Review Code 1- Verbalizes Understanding       Biometrics:  Pre Biometrics - 07/25/22 1330       Pre Biometrics   Height _0  (1.676 m)    Weight 188 lb 9.6 oz (85.5 kg)    Waist Circumference 37 inches    Hip Circumference 44 inches    Waist to Hip Ratio 0.84 %    BMI (Calculated) 30.46              Nutrition Therapy Plan and Nutrition Goals:  Nutrition Therapy & Goals - 07/20/22 1517       Nutrition Therapy   Diet Heart healthy, low Na, pulmonary MNT    Protein (specify units) 90-95g    Fiber 27 grams    Whole Grain Foods 3 servings    Saturated Fats 15 max. grams    Fruits and Vegetables 8 servings/day    Sodium 1.5 grams      Personal Nutrition Goals   Nutrition Goal ST: practice MyPlate guidelines (adding in 1-2 additional servings of non-starchy vegetables), be sure to include at least 1 good source of protein at meals and snacks, limit sodium by reading food labels and using other ways to flavor food LT: limit Na <1.5g/day, have at least  5 servings of vegetables/day    Comments [age].[id number]. F admitted to pulmonary rehab for sarcoidosis of lung. PMHx includes hypothyroidism, HLD, CKD stg 3, asthma, HTN, OSA, elevated BG (A1C 6.4), anxiety. Relevant medications includes lexapro, synthroid, MVI, prednisone, probiotic, tyvaso, trilogy. B: healthy waffles occasionally. She will have eggs with sausage. S: cheese and crackers or yogurt (greek yogurt) L: egg salad or tuna salad with lettuce, rye bread. S: cheese and crackers or yogurt (greek yogurt) D: protein (meat or chicken) with a salad and more starchy vegetables. S: sometimes low calorie ice cream bar. She loves fruit and will have it with most meals. She limits dairy as this can make her not feel great. She will use avocado oil and coconut aminos mostly, she does also use "no salt" which replaces sodium chloride with potassium choride. Drinks: Coffee (sometimes), water. She reports that her daughter is health conscious and has taken care of her for about 2 years - she is now going back to work. Discussed general heart healthy eating, sodium, and pulmonary MNT. Also reviewed ways to maintain a healthy BG. Advised to stop using the "no salt" and to decrease the salt in her processed foods as well as adding other ways to flavor her food. Since her protein needs are higher,  suggested she have at least one good source of protein at each meal.      Intervention Plan   Intervention Prescribe, educate and counsel regarding individualized specific dietary modifications aiming towards targeted core components such as weight, hypertension, lipid management, diabetes, heart failure and other comorbidities.    Expected Outcomes Short Term Goal: Understand basic principles of dietary content, such as calories, fat, sodium, cholesterol and nutrients.;Short Term Goal: A plan has been developed with personal nutrition goals set during dietitian appointment.;Long Term Goal: Adherence to prescribed nutrition plan.              Nutrition Assessments:  MEDIFICTS Score Key: ?70 Need to make dietary changes  40-70 Heart Healthy Diet ? 40 Therapeutic Level Cholesterol Diet  Flowsheet Row Pulmonary Rehab from 08/15/2022 in [facility] Re[facility]  Picture Your Plate Total Score on Admission 58      Picture Your Plate Scores: <97 Unhealthy dietary pattern with much room for improvement. 41-50 Dietary pattern unlikely to meet recommendations for good health and room for improvement. 51-60 More healthful dietary pattern, with some room for improvement.  >60 Healthy dietary pattern, although there may be some specific behaviors that could be improved.   Nutrition Goals Re-Evaluation:  Nutrition Goals Re-Evaluation     [place] Name 08/08/22 1113 08/29/22 1124 09/19/22 1134         Goals   Nutrition Goal ST: practice MyPlate guidelines (adding in 1-2 additional servings of non-starchy vegetables), be sure to include at least 1 good source of protein at meals and snacks, limit sodium by reading food labels and using other ways to flavor food LT: limit Na <1.5g/day, have at least 5 servings of vegetables/day -- Short: She will continue to eat fruits and vegetables daily, and to read food labels. Long: Continue to follow healthy eating patterns discussed with RD.     Comment She has been making smoothies regularly and has been adding more vegetables than fruit to them to help increase her intake. Including fruits and vegetables daily. She continues to read food labels regularly and feels that she has been able to maintain new healthier habits -- Tye Maryland is doing well with her diet.  Her weight is going back down now.  She is getting in more protein and getting in eggs and yogurt for breakfast. She is working on reading her food labels and watching sodium and sugar intake.  Her daughhter is helping her keep on top of her diet.     Expected Outcome Short: She will continue to eat fruits and vegetables  daily, and to read food labels. Long: Continue to follow healthy eating patterns discussed with RD. Short: She will continue to eat fruits and vegetables daily, and to read food labels. Long: Continue to follow healthy eating patterns discussed with RD. Short: Continue to work on adding in protein Long: Continue to work on healthy eating       Personal Goal #2 Re-Evaluation   Personal Goal #2 -- Continue to educate yourself on foods high in sodium and added sugar. Look for foods with less than ~10g added sugar per serving and less than ~241m sodium per serving --              Nutrition Goals Discharge (Final Nutrition Goals Re-Evaluation):  Nutrition Goals Re-Evaluation - 09/19/22 1134       Goals   Nutrition Goal Short: She will continue to eat fruits and vegetables daily, and to read food labels. Long:  Continue to follow healthy eating patterns discussed with RD.    Comment Tye Maryland is doing well with her diet.  Her weight is going back down now.  She is getting in more protein and getting in eggs and yogurt for breakfast. She is working on reading her food labels and watching sodium and sugar intake.  Her daughhter is helping her keep on top of her diet.    Expected Outcome Short: Continue to work on adding in protein Long: Continue to work on healthy eating             Psychosocial: Target Goals: Acknowledge presence or absence of significant depression and/or stress, maximize coping skills, provide positive support system. Participant is able to verbalize types and ability to use techniques and skills needed for reducing stress and depression.   Education: Stress, Anxiety, and Depression - Group verbal and visual presentation to define topics covered.  Reviews how body is impacted by stress, anxiety, and depression.  Also discusses healthy ways to reduce stress and to treat/manage anxiety and depression.  Written material given at graduation. Flowsheet Row Pulmonary Rehab from  08/10/2022 in [facility] Cardiac and Pulmonary Rehab  Date 08/10/22  Educator [facility]  Instruction Review Code 1- [employer] Understanding       Education: Sleep Hygiene -Provides group verbal and written instruction about how sleep can affect your health.  Define sleep hygiene, discuss sleep cycles and impact of sleep habits. Review good sleep hygiene tips.  Flowsheet Row Pulmonary Rehab from 08/10/2018 in [facility] Cardiac and Pulmonary Rehab  Date 04/11/18  Educator [facility]  Instruction Review Code 1- Verbalizes Understanding       Initial Review & Psychosocial Screening:  Initial Psych Review & Screening - 07/12/22 1339       Initial Review   Current issues with Current Stress Concerns    Source of Stress Concerns Chronic Illness      Family Dynamics   Good Support System? Yes   family and friends     Barriers   Psychosocial barriers to participate in program There are no identifiable barriers or psychosocial needs.;The patient should benefit from training in stress management and relaxation.      Screening Interventions   Interventions Encouraged to exercise;Provide feedback about the scores to participant;To provide support and resources with identified psychosocial needs    Expected Outcomes Short Term goal: Utilizing psychosocial counselor, staff and physician to assist with identification of specific Stressors or current issues interfering with healing process. Setting desired goal for each stressor or current issue identified.;Long Term Goal: Stressors or current issues are controlled or eliminated.;Short Term goal: Identification and review with participant of any Quality of Life or Depression concerns found by scoring the questionnaire.;Long Term goal: The participant improves quality of Life and PHQ9 Scores as seen by post scores and/or verbalization of changes             Quality of Life Scores:  Scores of 19 and below usually indicate a poorer quality of life in these areas.  A  difference of  2-3 points is a clinically meaningful difference.  A difference of 2-3 points in the total score of the Quality of Life Index has been associated with significant improvement in overall quality of life, self-image, physical symptoms, and general health in studies assessing change in quality of life.  PHQ-9: Review Flowsheet  More data exists      07/20/2022 06/24/2022 10/13/2021 09/28/2021 09/21/2020  Depression screen PHQ 2/9  Decreased Interest 0  0 0 0 0  Down, Depressed, Hopeless 0 0 0 0 0  PHQ - 2 Score 0 0 0 0 0  Altered sleeping 1 0 0 0 1  Tired, decreased energy 1 0 0 0 1  Change in appetite 0 0 0 0 0  Feeling bad or failure about yourself  0 0 0 0 0  Trouble concentrating 1 0 0 0 0  Moving slowly or fidgety/restless 0 0 0 0 0  Suicidal thoughts 0 0 0 0 0  PHQ-9 Score 3 0 0 0 2  Difficult doing work/chores Not difficult at all Not difficult at all Not difficult at all Not difficult at all Not difficult at all   Interpretation of Total Score  Total Score Depression Severity:  1-4 = Minimal depression, 5-9 = Mild depression, 10-14 = Moderate depression, 15-19 = Moderately severe depression, 20-27 = Severe depression   Psychosocial Evaluation and Intervention:  Psychosocial Evaluation - 07/12/22 1352       Psychosocial Evaluation & Interventions   Interventions Encouraged to exercise with the program and follow exercise prescription;Relaxation education    Comments Tye Maryland is returning to pulmonary rehab by request. She had completed the program in 2019 and after having a stroke and increased breathing issues, she asked to come back. She wants to be able to play with her 11 and soon to be 12 grandkids, and states she knows her breathing may not get better but she wants to feel better.  Her strength and stamina are also things she wants to work on. She does not report any sleep concerns. She reports that sometimes she may feel down about all the medical things that have  happened recently, but she doesn't stay there long because of her amazing support system.    Expected Outcomes SHort: attend pulmonary rehab for educaiton and exercise. Long: develop and maintain positive self care habits.    Continue Psychosocial Services  Follow up required by staff             Psychosocial Re-Evaluation:  Psychosocial Re-Evaluation     [place] Name 07/28/22 1130 08/29/22 1120 09/19/22 1129         Psychosocial Re-Evaluation   Current issues with Current Psychotropic Meds;Current Stress Concerns Current Psychotropic Meds;Current Stress Concerns Current Psychotropic Meds;Current Stress Concerns     Comments Tye Maryland states that she is stressed due to her stroke and hear breathing issues. He has a positive outlook on improving her health. Her breathing has got worse over the last couple years because she was not able to exercise much. Tye Maryland denies any major stressors at this time. She states that she has a good support system at home made up mostly by her husband and children. For stress relief she likes to read and study her Bible and play with her grandchildren. She also feels that exercise has been a good stress releiver for her as well. Tye Maryland is doing well in rehab.  She is doing more stairs than she used to so she is not relying on her daughter as much now.  She says her older grandkids helped decorate for Christmas but went over the top, but she was grateful for their help.  She does not have any major stressors currently.  She had a call from credit card to watch her account spending, but they were all legit charges.  She is still sleeping well. She is still taking time for herself for Bible study and spending time with her grandchildren.  Expected Outcomes Short: Start [name] to help with mood. Long: Maintain a healthy mental state Short: Continue [name] to help with mood. Long: Maintain a positive outlook. Short; COnitnue to exercise for menatl boost Long: Continue to  stay positive     Interventions Relaxation education;Stress management education;Encouraged to attend Pulmonary Rehabilitation for the exercise Relaxation education;Stress management education;Encouraged to attend Pulmonary Rehabilitation for the exercise Relaxation education;Stress management education;Encouraged to attend Pulmonary Rehabilitation for the exercise     Continue Psychosocial Services  Follow up required by staff Follow up required by staff Follow up required by staff              Psychosocial Discharge (Final Psychosocial Re-Evaluation):  Psychosocial Re-Evaluation - 09/19/22 1129       Psychosocial Re-Evaluation   Current issues with Current Psychotropic Meds;Current Stress Concerns    Comments Tye Maryland is doing well in rehab.  She is doing more stairs than she used to so she is not relying on her daughter as much now.  She says her older grandkids helped decorate for Christmas but went over the top, but she was grateful for their help.  She does not have any major stressors currently.  She had a call from credit card to watch her account spending, but they were all legit charges.  She is still sleeping well. She is still taking time for herself for Bible study and spending time with her grandchildren.    Expected Outcomes Short; COnitnue to exercise for menatl boost Long: Continue to stay positive    Interventions Relaxation education;Stress management education;Encouraged to attend Pulmonary Rehabilitation for the exercise    Continue Psychosocial Services  Follow up required by staff             Education: Education Goals: Education classes will be provided on a weekly basis, covering required topics. Participant will state understanding/return demonstration of topics presented.  Learning Barriers/Preferences:   General Pulmonary Education Topics:  Infection Prevention: - Provides verbal and written material to individual with discussion of infection control  including proper hand washing and proper equipment cleaning during exercise session. Flowsheet Row Pulmonary Rehab from 08/10/2022 in [facility] Cardiac and Pulmonary Rehab  Date 07/27/22  Educator NT  Instruction Review Code 1- Verbalizes Understanding       Falls Prevention: - Provides verbal and written material to individual with discussion of falls prevention and safety. Flowsheet Row Pulmonary Rehab from 08/10/2022 in [facility] Cardiac and Pulmonary Rehab  Date 07/27/22  Educator NT  Instruction Review Code 1- Verbalizes Understanding       Chronic Lung Disease Review: - Group verbal instruction with posters, models, PowerPoint presentations and videos,  to review new updates, new respiratory medications, new advancements in procedures and treatments. Providing information on websites and "800" numbers for continued self-education. Includes information about supplement oxygen, available portable oxygen systems, continuous and intermittent flow rates, oxygen safety, concentrators, and Medicare reimbursement for oxygen. Explanation of Pulmonary Drugs, including class, frequency, complications, importance of spacers, rinsing mouth after steroid MDI's, and proper cleaning methods for nebulizers. Review of basic lung anatomy and physiology related to function, structure, and complications of lung disease. Review of risk factors. Discussion about methods for diagnosing sleep apnea and types of masks and machines for OSA. Includes a review of the use of types of environmental controls: home humidity, furnaces, filters, dust mite/pet prevention, HEPA vacuums. Discussion about weather changes, air quality and the benefits of nasal washing. Instruction on Warning signs, infection symptoms, calling MD promptly,  preventive modes, and value of vaccinations. Review of effective airway clearance, coughing and/or vibration techniques. Emphasizing that all should Create an Action Plan. Written material given at  graduation. Flowsheet Row Pulmonary Rehab from 08/10/2018 in [facility] Cardiac and Pulmonary Rehab  Date 06/29/18  Educator [facility]  Instruction Review Code 1- Verbalizes Understanding       AED/CPR: - Group verbal and written instruction with the use of models to demonstrate the basic use of the AED with the basic ABC's of resuscitation.    Anatomy and Cardiac Procedures: - Group verbal and visual presentation and models provide information about basic cardiac anatomy and function. Reviews the testing methods done to diagnose heart disease and the outcomes of the test results. Describes the treatment choices: Medical Management, Angioplasty, or Coronary Bypass Surgery for treating various heart conditions including Myocardial Infarction, Angina, Valve Disease, and Cardiac Arrhythmias.  Written material given at graduation. Flowsheet Row Pulmonary Rehab from 08/10/2018 in [facility] Cardiac and Pulmonary Rehab  Date 05/16/18  Educator [facility]  Instruction Review Code 5- Refused Teaching       Medication Safety: - Group verbal and visual instruction to review commonly prescribed medications for heart and lung disease. Reviews the medication, class of the drug, and side effects. Includes the steps to properly store meds and maintain the prescription regimen.  Written material given at graduation. Flowsheet Row Pulmonary Rehab from 08/10/2018 in [facility] Cardiac and Pulmonary Rehab  Date 08/10/18  Educator [facility]  Instruction Review Code 1- Verbalizes Understanding       Other: -Provides group and verbal instruction on various topics (see comments)   Knowledge Questionnaire Score:  Knowledge Questionnaire Score - 08/15/22 1642       Knowledge Questionnaire Score   Pre Score 11/18              Core Components/Risk Factors/Patient Goals at Admission:  Personal Goals and Risk Factors at Admission - 07/12/22 1337       Core Components/Risk Factors/Patient Goals on Admission    Weight Management  --    Intervention --    Expected Outcomes --    Improve shortness of breath with ADL's Yes    Intervention Provide education, individualized exercise plan and daily activity instruction to help decrease symptoms of SOB with activities of daily living.    Expected Outcomes Short Term: Improve cardiorespiratory fitness to achieve a reduction of symptoms when performing ADLs;Long Term: Be able to perform more ADLs without symptoms or delay the onset of symptoms    Hypertension Yes    Intervention Provide education on lifestyle modifcations including regular physical activity/exercise, weight management, moderate sodium restriction and increased consumption of fresh fruit, vegetables, and low fat dairy, alcohol moderation, and smoking cessation.;Monitor prescription use compliance.    Expected Outcomes Short Term: Continued assessment and intervention until BP is < 140/42m HG in hypertensive participants. < 130/888mHG in hypertensive participants with diabetes, heart failure or chronic kidney disease.;Long Term: Maintenance of blood pressure at goal levels.    Lipids Yes    Intervention Provide education and support for participant on nutrition & aerobic/resistive exercise along with prescribed medications to achieve LDL <7068mHDL >34m34m  Expected Outcomes Short Term: Participant states understanding of desired cholesterol values and is compliant with medications prescribed. Participant is following exercise prescription and nutrition guidelines.;Long Term: Cholesterol controlled with medications as prescribed, with individualized exercise RX and with personalized nutrition plan. Value goals: LDL < 70mg35mL > 40 mg.  Education:Diabetes - Individual verbal and written instruction to review signs/symptoms of diabetes, desired ranges of glucose level fasting, after meals and with exercise. Acknowledge that pre and post exercise glucose checks will be done for 3 sessions at entry of  program.   Know Your Numbers and Heart Failure: - Group verbal and visual instruction to discuss disease risk factors for cardiac and pulmonary disease and treatment options.  Reviews associated critical values for Overweight/Obesity, Hypertension, Cholesterol, and Diabetes.  Discusses basics of heart failure: signs/symptoms and treatments.  Introduces Heart Failure Zone chart for action plan for heart failure.  Written material given at graduation. Flowsheet Row Pulmonary Rehab from 08/10/2022 in [facility] Cardiac and Pulmonary Rehab  Date 07/27/22  Educator [facility]  Instruction Review Code 1- Verbalizes Understanding       Core Components/Risk Factors/Patient Goals Review:   Goals and Risk Factor Review     Row Name 07/28/22 1127 08/08/22 1114 08/29/22 1125 09/19/22 1137       Core Components/Risk Factors/Patient Goals Review   Personal Goals Review Improve shortness of breath with ADL's Improve shortness of breath with ADL's Improve shortness of breath with ADL's;Weight Management/Obesity Improve shortness of breath with ADL's;Weight Management/Obesity;Increase knowledge of respiratory medications and ability to use respiratory devices properly.    Review Spoke to patient about their shortness of breath and what they can do to improve. Patient has been informed of breathing techniques when starting the program. Patient is informed to tell staff if they have had any med changes and that certain meds they are taking or not taking can be causing shortness of breath. Spoke to patient about their shortness of breath and what they can do to improve. Patient has been informed of breathing techniques when starting the program. Patient is informed to tell staff if they have had any med changes and that certain meds they are taking or not taking can be causing shortness of breath. Tye Maryland states that she would like to lose a little bit of weight with a goal of 165-170 lbs. She also has been monitoring her O2  saturations at home and reports that they are staying above 95. She also states that she has experienced very little SOB with her ADL's. Tye Maryland has not been checking her BP at home but does have a BP cuff and was encouraged to start monitoring it at home. Tye Maryland is doing well in rehab.  Her weight is starting to come back down.  Her breathing is doing better, she is able to get up and down more and doing more going up the stairs and needing less help from her daughter. She is doing well on her meds.  She has only been using her nebulizer when she needs it but hasn't needed it much.  She had esphogeal spasms recently and has found that peppermint oil helps relieve her symptoms.  She has linked it to either pepper or noddle/rice getting caught.    Expected Outcomes Short: Attend [name] regularly to improve shortness of breath with ADL's. Long: maintain independence with ADL's Short: Attend [name] regularly to improve shortness of breath with ADL's. Long: maintain independence with ADL's Short: Attend [name] regularly to improve shortness of breath with ADL's. Long: Continue to monitor lifestyle risk factors. Short; COntinue to work on weight loss lOng: conitnue to work on breathing             Core Components/Risk Factors/Patient Goals at Discharge (Final Review):   Goals and Risk Factor Review - 09/19/22 1137  Core Components/Risk Factors/Patient Goals Review   Personal Goals Review Improve shortness of breath with ADL's;Weight Management/Obesity;Increase knowledge of respiratory medications and ability to use respiratory devices properly.    Review Tye Maryland is doing well in rehab.  Her weight is starting to come back down.  Her breathing is doing better, she is able to get up and down more and doing more going up the stairs and needing less help from her daughter. She is doing well on her meds.  She has only been using her nebulizer when she needs it but hasn't needed it much.  She had  esphogeal spasms recently and has found that peppermint oil helps relieve her symptoms.  She has linked it to either pepper or noddle/rice getting caught.    Expected Outcomes Short; COntinue to work on weight loss lOng: conitnue to work on breathing             ITP Comments:  ITP Comments     Row Name 07/12/22 1346 07/20/22 1546 07/25/22 1012 08/17/22 0942 09/14/22 0810   ITP Comments Initial telephone orientation completed. Diagnosis can be found in [facility] 8/16. EP orientation scheduled for Wednesday 10/4 at 2:30. Patient presented for orientation today, 10/4. Patient wore open-toed shoes and therefore could not complete 6MWT. Will do 6MWT on Monday, 10/9. First full day of exercise!  Patient was oriented to gym and equipment including functions, settings, policies, and procedures.  Patient's individual exercise prescription and treatment plan were reviewed.  All starting workloads were established based on the results of the 6 minute walk test done at initial orientation visit.  The plan for exercise progression was also introduced and progression will be customized based on patient's performance and goals. 30 Day review completed. Medical Director ITP review done, changes made as directed, and signed approval by Medical Director.   NEW TO PROGRAM 30 Day review completed. Medical Director ITP review done, changes made as directed, and signed approval by Medical Director.    [place] Name 10/12/22 1023 10/13/22 1444 11/09/22 0942       ITP Comments 30 Day review completed. Medical Director ITP review done, changes made as directed, and signed approval by Medical Director. Called to check on patient.  She has been out sick.    It has gone through the whole family.  She is feeling better and hopes to return soon. 30 Day review completed. Medical Director ITP review done, changes made as directed, and signed approval by Medical Director.   remains out              Comments:

## 2022-11-14 ENCOUNTER — Telehealth: Payer: Self-pay | Admitting: *Deleted

## 2022-11-14 ENCOUNTER — Encounter: Payer: Self-pay | Admitting: *Deleted

## 2022-11-14 ENCOUNTER — Encounter: Payer: BC Managed Care – PPO | Admitting: *Deleted

## 2022-11-14 DIAGNOSIS — D860 Sarcoidosis of lung: Secondary | ICD-10-CM

## 2022-11-14 NOTE — Telephone Encounter (Signed)
Called to check on patient.  Last attended on 10/26/22.  She saw her pulmonologist and they think something may be going on with her heart and requested that she hold off until meeting with cardiology next week and is scheduled for a cath possibly. She is now on hold until we hear from cardiology.

## 2022-11-15 DIAGNOSIS — Z20822 Contact with and (suspected) exposure to covid-19: Secondary | ICD-10-CM | POA: Diagnosis not present

## 2022-11-15 DIAGNOSIS — R5383 Other fatigue: Secondary | ICD-10-CM | POA: Diagnosis not present

## 2022-11-20 DIAGNOSIS — I10 Essential (primary) hypertension: Secondary | ICD-10-CM | POA: Diagnosis not present

## 2022-11-20 DIAGNOSIS — G4733 Obstructive sleep apnea (adult) (pediatric): Secondary | ICD-10-CM | POA: Diagnosis not present

## 2022-11-23 ENCOUNTER — Encounter: Payer: Self-pay | Admitting: Internal Medicine

## 2022-11-23 ENCOUNTER — Ambulatory Visit: Payer: BC Managed Care – PPO | Attending: Internal Medicine | Admitting: Internal Medicine

## 2022-11-23 VITALS — BP 140/70 | HR 108 | Ht 67.5 in | Wt 188.0 lb

## 2022-11-23 DIAGNOSIS — I272 Pulmonary hypertension, unspecified: Secondary | ICD-10-CM | POA: Diagnosis not present

## 2022-11-23 DIAGNOSIS — R079 Chest pain, unspecified: Secondary | ICD-10-CM

## 2022-11-23 DIAGNOSIS — J9611 Chronic respiratory failure with hypoxia: Secondary | ICD-10-CM

## 2022-11-23 DIAGNOSIS — D869 Sarcoidosis, unspecified: Secondary | ICD-10-CM

## 2022-11-23 DIAGNOSIS — R0602 Shortness of breath: Secondary | ICD-10-CM

## 2022-11-23 MED ORDER — ASPIRIN 81 MG PO TBEC: 81.0000 mg | DELAYED_RELEASE_TABLET | Freq: Every day | ORAL | 3 refills | Status: DC

## 2022-11-23 NOTE — H&P (View-Only) (Signed)
New Outpatient Visit Date: 11/23/2022  Referring Provider: Ottie Glazier, MD [place], [place] [address]  Chief Complaint: Shortness of breath and pulmonary hypertension  HPI:  Stacie [name] is a 67 y.o. female who is being seen today for the evaluation of shortness of breath and pulmonary hypertension in the setting of sarcoidosis at the request of Dr. Lanney Gins. She has a history of sarcoidosis complicated by pulmonary hypertension and chronic respiratory failure with hypoxia, watershed stroke and digital ischemia leading to multiple toe amputations, hyperlipidemia, kidney stones, diverticulitis s/p partial colectomy, and osteoporosis.  She has a long history of pulmonary sarcoidosis and was followed at [facility] for many years.  She was hospitalized at [facility] in 2021 with severe sepsis due to Klebsiella infection related to bilateral nephrolithiasis (obstructive on the right) complicated by watershed/embolic CVA and digital ischemia.  She had a lengthy recovery with multiple subsequent hospitalization, including for COVID-19 in 10/2021.  She has been following closely with Dr. Lanney Gins, who has been managing her sarcoidosis and pulmonary hypertension therapy.  She is currently on prednisone and inhaled treprostinil.  She had a recent respiratory infection (negative for RSV, influenza, and COVID-19), which seemed to take longer than usual for her breathing to return to baseline.  However, over the last week, she feels like her breathing is at baseline.  She is now using 2.5 L of supplemental oxygen, as well as nocturnal CPAP.  Stacie [name] reports intermittent vague chest pain.  It is not clearly exertional or associated with other activities.  She has mild dependent edema, which has been stable.  She reports having undergone RHC at [facility] several years ago but has not had an ischemia evaluation.  Most recent echo in 01/2021 was notable for normal left and right ventricular function with severe  pulmonary hypertension and moderate tricuspid regurgitation.  She has never undergone cardiac MRI  --------------------------------------------------------------------------------------------------  Past Medical History:  Diagnosis Date   Acute renal failure ([place])    Acute respiratory failure ([place]) 10/23/2021   Acute respiratory failure with hypoxia (H[facility])    ARF (acute respiratory failure) ([place]) 10/19/2021   Arrhythmia    patient unaware if this is current   Asthma    Chronic kidney disease    Critical lower limb ischemia (HCC)    Depression    GERD (gastroesophageal reflux disease)    Heart murmur    History of kidney stones    HOH (hard of hearing)    wear aids   Hyperthyroidism    Hypothyroidism    IBS (irritable bowel syndrome)    Pneumonia    Pulmonary hypertension ([place])    Sarcoid    Sarcoidosis    Seasonal allergies    Sleep apnea CPAP with O2   Stroke ([place]) 07/2020   watershed   Wears hearing aid in both ears     Past Surgical History:  Procedure Laterality Date   ABDOMINAL HYSTERECTOMY     partial   AMPUTATION Left 01/14/2021   Procedure: AMPUTATION RAY (1ST TOE ) ( 2ND METATARSAL HEAD RESECTION);  Surgeon: Algernon Huxley, MD;  Location: [facility];  Service: Vascular;  Laterality: Left;   CARDIAC CATHETERIZATION  10/18/2018   [facility]   CATARACT EXTRACTION W/PHACO Left 07/31/2019   Procedure: CATARACT EXTRACTION PHACO AND INTRAOCULAR LENS PLACEMENT (IOC) LEFT 00:51.1  17.9%  9.15;  Surgeon: Leandrew Koyanagi, MD;  Location: [facility];  Service: Ophthalmology;  Laterality: Left;  keep this patient second   COLON SURGERY     "  colon was fused to bladder - operated on both"   COLONOSCOPY  09/18/2007   diverticuli, no polyps   COLONOSCOPY  05/26/2010   diverticuli, no polyps   CYSTOSCOPY WITH STENT PLACEMENT Bilateral 07/14/2020   Procedure: CYSTOSCOPY WITH STENT PLACEMENT, RETROPYLOGRAM;  Surgeon: Billey Co, MD;  Location: [facility];  Service: Urology;   Laterality: Bilateral;   CYSTOSCOPY/URETEROSCOPY/HOLMIUM LASER/STENT PLACEMENT Left 02/20/2020   Procedure: CYSTOSCOPY/URETEROSCOPY/LITHOTRIPSY /STENT PLACEMENT;  Surgeon: Hollice Espy, MD;  Location: [facility];  Service: Urology;  Laterality: Left;   CYSTOSCOPY/URETEROSCOPY/HOLMIUM LASER/STENT PLACEMENT Bilateral 10/02/2020   Procedure: CYSTOSCOPY/URETEROSCOPY/HOLMIUM LASER/STENT PLACEMENT;  Surgeon: Billey Co, MD;  Location: [facility];  Service: Urology;  Laterality: Bilateral;   EYE SURGERY     KYPHOPLASTY N/A 05/10/2021   Procedure: KYPHOPLASTY, L2, L3, L4;  Surgeon: Hessie Knows, MD;  Location: [facility];  Service: Orthopedics;  Laterality: N/A;   KYPHOPLASTY N/A 06/25/2021   Procedure: T10, T12, L1 KYPHOPLASTY;  Surgeon: Hessie Knows, MD;  Location: [facility];  Service: Orthopedics;  Laterality: N/A;   PARS PLANA VITRECTOMY Right 05/20/2015   Procedure: PARS PLANA VITRECTOMY WITH 25 GAUGE, laser;  Surgeon: Milus Height, MD;  Location: [facility];  Service: Ophthalmology;  Laterality: Right;   PARTIAL HYSTERECTOMY  1990   TEE WITHOUT CARDIOVERSION N/A 07/27/2020   Procedure: TRANSESOPHAGEAL ECHOCARDIOGRAM (TEE);  Surgeon: Teodoro Spray, MD;  Location: [facility];  Service: Cardiovascular;  Laterality: N/A;   TUBAL LIGATION     WISDOM TOOTH EXTRACTION      Current Meds  Medication Sig   acetaminophen (TYLENOL) 500 MG tablet Take 500 mg by mouth every 6 (six) hours as needed for moderate pain or headache.   albuterol (ACCUNEB) 1.25 MG/3ML nebulizer solution Take 1 ampule by nebulization as needed.   aspirin EC 81 MG tablet Take 1 tablet (81 mg total) by mouth daily. Swallow whole.   azithromycin (ZITHROMAX) 250 MG tablet Take 250 mg by mouth 3 (three) times a week.   budesonide (PULMICORT) 0.25 MG/2ML nebulizer solution Take 0.25 mg by nebulization daily as needed.   denosumab (PROLIA) 60 MG/ML SOSY injection Inject 60 mg into the skin every 6 (six) months.   dorzolamide (TRUSOPT) 2  % ophthalmic solution Place 1 drop into both eyes 2 (two) times daily.   escitalopram (LEXAPRO) 20 MG tablet Take 1 tablet (20 mg total) by mouth daily.   Fluticasone-Umeclidin-Vilant (TRELEGY ELLIPTA) 200-62.5-25 MCG/ACT AEPB Inhale into the lungs daily.   latanoprost (XALATAN) 0.005 % ophthalmic solution Place 1 drop into the right eye 2 (two) times daily.   levothyroxine (SYNTHROID) 137 MCG tablet Take 1 tablet (137 mcg total) by mouth daily before breakfast.   Multiple Vitamins-Minerals (MULTIVITAMIN WITH MINERALS) tablet Take 1 tablet by mouth daily.   naproxen sodium (ALEVE) 220 MG tablet Take 220 mg by mouth daily as needed (pain).   NON FORMULARY CPAP nightly   OXYGEN Inhale 2 L into the lungs daily as needed (During walking and activity).    predniSONE (DELTASONE) 10 MG tablet Take 2 tablets (20 mg total) by mouth daily. Resume only after the finishing new Rx for prednisone (Patient taking differently: Take 15 mg by mouth daily.)   Probiotic Product (PROBIOTIC PO) Take 1 capsule by mouth daily.   Treprostinil (TYVASO) 0.6 MG/ML SOLN Inhale into the lungs See admin instructions. 12 breaths 4 times a day    Allergies: Nitrofurantoin, Plaquenil [hydroxychloroquine], Tramadol, and Sulfa antibiotics  Social History   Tobacco Use   Smoking  status: Never   Smokeless tobacco: Never  Vaping Use   Vaping Use: Never used  Substance Use Topics   Alcohol use: No   Drug use: No    Family History  Problem Relation Age of Onset   Allergies Father    Asthma Father    Colon cancer Father    Allergies Brother    Pancreatic cancer Brother    Asthma Brother    Breast cancer Maternal Grandmother     Review of Systems: A 12-system review of systems was performed and was negative except as noted in the HPI.  --------------------------------------------------------------------------------------------------  Physical Exam: BP (!) 140/70 (BP Location: Right Arm, Patient Position: Sitting,  Cuff Size: Normal)   Pulse (!) 108   Ht 5' 7.5" (1.715 m)   Wt 188 lb (85.3 kg)   LMP  (LMP Unknown)   SpO2 96% Comment: on 2.5L O2  BMI 29.01 kg/m   General:  NAD.  Accompanied by her husband. HEENT: No conjunctival pallor or scleral icterus.  Nasal cannula in place. Neck: Supple without lymphadenopathy, thyromegaly, JVD, or HJR. No carotid bruit. Lungs: Clear lungs posteriorly.  Dry crackles noted bilaterally with anterior auscultation.  Normal work of breathing. Heart: Tachycardic but regular with 1/6 systolic murmur. Abd: Bowel sounds present. Soft, NT/ND without hepatosplenomegaly Ext: No lower extremity edema. Skin: Warm and dry without rash. Neuro: CNIII-XII intact. Strength and fine-touch sensation intact in upper and lower extremities bilaterally. Psych: Normal mood and affect.  EKG:  Sinus tachycardia with left atrial enlargement, LVH, and nonspecific ST/T changes.  Compared with prior tracing from 10/19/2021, heart rate has decreased; left atrial enlargement is now present.  Lab Results  Component Value Date   WBC 13.3 (H) 06/24/2022   HGB 13.5 06/24/2022   HCT 41.7 06/24/2022   MCV 89 06/24/2022   PLT 332 06/24/2022    Lab Results  Component Value Date   NA 147 (H) 06/24/2022   K 4.5 06/24/2022   CL 103 06/24/2022   CO2 23 06/24/2022   BUN 26 06/24/2022   CREATININE 1.47 (H) 06/24/2022   GLUCOSE 152 (H) 06/24/2022   ALT 15 06/24/2022    Lab Results  Component Value Date   CHOL 289 (H) 07/21/2020   HDL 33 (L) 07/21/2020   LDLCALC 198 (H) 07/21/2020   TRIG 290 (H) 07/21/2020   CHOLHDL 8.8 07/21/2020    --------------------------------------------------------------------------------------------------  ASSESSMENT AND PLAN: Sarcoidosis, pulmonary hypertension, chronic respiratory failure with hypoxia, shortness of breath, and chest pain: Stacie [name] has a long history of sarcoidosis complicated by pulmonary hypertension.  Her breathing has returned to  baseline following a recent suspected viral respiratory illness, though recovery took longer than she has been accustomed to.  Last echo in 2022 showed severe pulmonary hypertension and moderate TR with preserved biventricular function.  Cardiac catheterization has been recommended by Dr. Lanney Gins.  We will begin with an echo to reassess her ventricles and valvular heart disease.  We will then proceed with cardiac catheterization.  Given her vague chest discomfort and hyperlipidemia with LDL of 198 in 2021, I think it would be prudent to do left and right heart catheterization.  This will also allow [place] to assess her LVEDP, in case PCWP is inaccurate in the setting of her severe pulmonary hypertension.  In the meantime, I have recommended addition of ASA 81 mg daily.  Ongoing management of sarcoidosis and pulmonary hypertension per Dr. Lanney Gins.  Bases on results of cath and echo, we may need  to consider cardiac MRI +/- PET/CT for further evaluation of myocardial involvement with sarcoidosis.  Shared Decision Making/Informed Consent The risks [stroke (1 in 1000), death (1 in 1000), kidney failure [usually temporary] (1 in 500), bleeding (1 in 200), allergic reaction [possibly serious] (1 in 200)], benefits (diagnostic support and management of coronary artery disease) and alternatives of a cardiac catheterization were discussed in detail with Stacie [name] and she is willing to proceed.  Follow-up: Return to clinic ~2 weeks after cath.  Nelva Bush, MD 11/24/2022 4:18 PM

## 2022-11-23 NOTE — Progress Notes (Unsigned)
New Outpatient Visit Date: 11/23/2022  Referring Provider: Ottie Glazier, MD [place], [place] [address]  Chief Complaint: Shortness of breath and pulmonary hypertension  HPI:  Ms. Theard is a 67 y.o. female who is being seen today for the evaluation of shortness of breath and pulmonary hypertension in the setting of sarcoidosis at the request of Dr. Lanney Gins. She has a history of sarcoidosis complicated by pulmonary hypertension and chronic respiratory failure with hypoxia, watershed stroke and digital ischemia leading to multiple toe amputations, hyperlipidemia, kidney stones, diverticulitis s/p partial colectomy, and osteoporosis.  She has a long history of pulmonary sarcoidosis and was followed at [facility] for many years.  She was hospitalized at [facility] in 2021 with severe sepsis due to Klebsiella infection related to bilateral nephrolithiasis (obstructive on the right) complicated by watershed/embolic CVA and digital ischemia.  She had a lengthy recovery with multiple subsequent hospitalization, including for COVID-19 in 10/2021.  She has been following closely with Dr. Lanney Gins, who has been managing her sarcoidosis and pulmonary hypertension therapy.  She is currently on prednisone and inhaled treprostinil.  She had a recent respiratory infection (negative for RSV, influenza, and COVID-19), which seemed to take longer than usual for her breathing to return to baseline.  However, over the last week, she feels like her breathing is at baseline.  She is now using 2.5 L of supplemental oxygen, as well as nocturnal CPAP.  Ms. Hargrove reports intermittent vague chest pain.  It is not clearly exertional or associated with other activities.  She has mild dependent edema, which has been stable.  She reports having undergone RHC at [facility] several years ago but has not had an ischemia evaluation.  Most recent echo in 01/2021 was notable for normal left and right ventricular function with severe  pulmonary hypertension and moderate tricuspid regurgitation.  She has never undergone cardiac MRI  --------------------------------------------------------------------------------------------------  Past Medical History:  Diagnosis Date   Acute renal failure ([facility])    Acute respiratory failure ([place]) 10/23/2021   Acute respiratory failure with hypoxia (H[facility])    ARF (acute respiratory failure) ([place]) 10/19/2021   Arrhythmia    patient unaware if this is current   Asthma    Chronic kidney disease    Critical lower limb ischemia (H[facility])    Depression    GERD (gastroesophageal reflux disease)    Heart murmur    History of kidney stones    HOH (hard of hearing)    wear aids   Hyperthyroidism    Hypothyroidism    IBS (irritable bowel syndrome)    Pneumonia    Pulmonary hypertension ([place])    Sarcoid    Sarcoidosis    Seasonal allergies    Sleep apnea CPAP with O2   Stroke ([place]) 07/2020   watershed   Wears hearing aid in both ears     Past Surgical History:  Procedure Laterality Date   ABDOMINAL HYSTERECTOMY     partial   AMPUTATION Left 01/14/2021   Procedure: AMPUTATION RAY (1ST TOE ) ( 2ND METATARSAL HEAD RESECTION);  Surgeon: Algernon Huxley, MD;  Location: [facility];  Service: Vascular;  Laterality: Left;   CARDIAC CATHETERIZATION  10/18/2018   [facility]   CATARACT EXTRACTION W/PHACO Left 07/31/2019   Procedure: CATARACT EXTRACTION PHACO AND INTRAOCULAR LENS PLACEMENT (IOC) LEFT 00:51.1  17.9%  9.15;  Surgeon: Leandrew Koyanagi, MD;  Location: [facility];  Service: Ophthalmology;  Laterality: Left;  keep this patient second   COLON SURGERY     "  colon was fused to bladder - operated on both"   COLONOSCOPY  09/18/2007   diverticuli, no polyps   COLONOSCOPY  05/26/2010   diverticuli, no polyps   CYSTOSCOPY WITH STENT PLACEMENT Bilateral 07/14/2020   Procedure: CYSTOSCOPY WITH STENT PLACEMENT, RETROPYLOGRAM;  Surgeon: Billey Co, MD;  Location: [facility];  Service: Urology;   Laterality: Bilateral;   CYSTOSCOPY/URETEROSCOPY/HOLMIUM LASER/STENT PLACEMENT Left 02/20/2020   Procedure: CYSTOSCOPY/URETEROSCOPY/LITHOTRIPSY /STENT PLACEMENT;  Surgeon: Hollice Espy, MD;  Location: [facility];  Service: Urology;  Laterality: Left;   CYSTOSCOPY/URETEROSCOPY/HOLMIUM LASER/STENT PLACEMENT Bilateral 10/02/2020   Procedure: CYSTOSCOPY/URETEROSCOPY/HOLMIUM LASER/STENT PLACEMENT;  Surgeon: Billey Co, MD;  Location: [facility];  Service: Urology;  Laterality: Bilateral;   EYE SURGERY     KYPHOPLASTY N/A 05/10/2021   Procedure: KYPHOPLASTY, L2, L3, L4;  Surgeon: Hessie Knows, MD;  Location: [facility];  Service: Orthopedics;  Laterality: N/A;   KYPHOPLASTY N/A 06/25/2021   Procedure: T10, T12, L1 KYPHOPLASTY;  Surgeon: Hessie Knows, MD;  Location: [facility];  Service: Orthopedics;  Laterality: N/A;   PARS PLANA VITRECTOMY Right 05/20/2015   Procedure: PARS PLANA VITRECTOMY WITH 25 GAUGE, laser;  Surgeon: Milus Height, MD;  Location: [facility];  Service: Ophthalmology;  Laterality: Right;   PARTIAL HYSTERECTOMY  1990   TEE WITHOUT CARDIOVERSION N/A 07/27/2020   Procedure: TRANSESOPHAGEAL ECHOCARDIOGRAM (TEE);  Surgeon: Teodoro Spray, MD;  Location: [facility];  Service: Cardiovascular;  Laterality: N/A;   TUBAL LIGATION     WISDOM TOOTH EXTRACTION      Current Meds  Medication Sig   acetaminophen (TYLENOL) 500 MG tablet Take 500 mg by mouth every 6 (six) hours as needed for moderate pain or headache.   albuterol (ACCUNEB) 1.25 MG/3ML nebulizer solution Take 1 ampule by nebulization as needed.   aspirin EC 81 MG tablet Take 1 tablet (81 mg total) by mouth daily. Swallow whole.   azithromycin (ZITHROMAX) 250 MG tablet Take 250 mg by mouth 3 (three) times a week.   budesonide (PULMICORT) 0.25 MG/2ML nebulizer solution Take 0.25 mg by nebulization daily as needed.   denosumab (PROLIA) 60 MG/ML SOSY injection Inject 60 mg into the skin every 6 (six) months.   dorzolamide (TRUSOPT) 2  % ophthalmic solution Place 1 drop into both eyes 2 (two) times daily.   escitalopram (LEXAPRO) 20 MG tablet Take 1 tablet (20 mg total) by mouth daily.   Fluticasone-Umeclidin-Vilant (TRELEGY ELLIPTA) 200-62.5-25 MCG/ACT AEPB Inhale into the lungs daily.   latanoprost (XALATAN) 0.005 % ophthalmic solution Place 1 drop into the right eye 2 (two) times daily.   levothyroxine (SYNTHROID) 137 MCG tablet Take 1 tablet (137 mcg total) by mouth daily before breakfast.   Multiple Vitamins-Minerals (MULTIVITAMIN WITH MINERALS) tablet Take 1 tablet by mouth daily.   naproxen sodium (ALEVE) 220 MG tablet Take 220 mg by mouth daily as needed (pain).   NON FORMULARY CPAP nightly   OXYGEN Inhale 2 L into the lungs daily as needed (During walking and activity).    predniSONE (DELTASONE) 10 MG tablet Take 2 tablets (20 mg total) by mouth daily. Resume only after the finishing new Rx for prednisone (Patient taking differently: Take 15 mg by mouth daily.)   Probiotic Product (PROBIOTIC PO) Take 1 capsule by mouth daily.   Treprostinil (TYVASO) 0.6 MG/ML SOLN Inhale into the lungs See admin instructions. 12 breaths 4 times a day    Allergies: Nitrofurantoin, Plaquenil [hydroxychloroquine], Tramadol, and Sulfa antibiotics  Social History   Tobacco Use   Smoking  status: Never   Smokeless tobacco: Never  Vaping Use   Vaping Use: Never used  Substance Use Topics   Alcohol use: No   Drug use: No    Family History  Problem Relation Age of Onset   Allergies Father    Asthma Father    Colon cancer Father    Allergies Brother    Pancreatic cancer Brother    Asthma Brother    Breast cancer Maternal Grandmother     Review of Systems: A 12-system review of systems was performed and was negative except as noted in the HPI.  --------------------------------------------------------------------------------------------------  Physical Exam: BP (!) 140/70 (BP Location: Right Arm, Patient Position: Sitting,  Cuff Size: Normal)   Pulse (!) 108   Ht 5' 7.5" (1.715 m)   Wt 188 lb (85.3 kg)   LMP  (LMP Unknown)   SpO2 96% Comment: on 2.5L O2  BMI 29.01 kg/m   General:  NAD.  Accompanied by her husband. HEENT: No conjunctival pallor or scleral icterus.  Nasal cannula in place. Neck: Supple without lymphadenopathy, thyromegaly, JVD, or HJR. No carotid bruit. Lungs: Clear lungs posteriorly.  Dry crackles noted bilaterally with anterior auscultation.  Normal work of breathing. Heart: Tachycardic but regular with 1/6 systolic murmur. Abd: Bowel sounds present. Soft, NT/ND without hepatosplenomegaly Ext: No lower extremity edema. Skin: Warm and dry without rash. Neuro: CNIII-XII intact. Strength and fine-touch sensation intact in upper and lower extremities bilaterally. Psych: Normal mood and affect.  EKG:  Sinus tachycardia with left atrial enlargement, LVH, and nonspecific ST/T changes.  Compared with prior tracing from 10/19/2021, heart rate has decreased; left atrial enlargement is now present.  Lab Results  Component Value Date   WBC 13.3 (H) 06/24/2022   HGB 13.5 06/24/2022   HCT 41.7 06/24/2022   MCV 89 06/24/2022   PLT 332 06/24/2022    Lab Results  Component Value Date   NA 147 (H) 06/24/2022   K 4.5 06/24/2022   CL 103 06/24/2022   CO2 23 06/24/2022   BUN 26 06/24/2022   CREATININE 1.47 (H) 06/24/2022   GLUCOSE 152 (H) 06/24/2022   ALT 15 06/24/2022    Lab Results  Component Value Date   CHOL 289 (H) 07/21/2020   HDL 33 (L) 07/21/2020   LDLCALC 198 (H) 07/21/2020   TRIG 290 (H) 07/21/2020   CHOLHDL 8.8 07/21/2020    --------------------------------------------------------------------------------------------------  ASSESSMENT AND PLAN: Sarcoidosis, pulmonary hypertension, chronic respiratory failure with hypoxia, shortness of breath, and chest pain: Ms. Delahunt has a long history of sarcoidosis complicated by pulmonary hypertension.  Her breathing has returned to  baseline following a recent suspected viral respiratory illness, though recovery took longer than she has been accustomed to.  Last echo in 2022 showed severe pulmonary hypertension and moderate TR with preserved biventricular function.  Cardiac catheterization has been recommended by Dr. Lanney Gins.  We will begin with an echo to reassess her ventricles and valvular heart disease.  We will then proceed with cardiac catheterization.  Given her vague chest discomfort and hyperlipidemia with LDL of 198 in 2021, I think it would be prudent to do left and right heart catheterization.  This will also allow [place] to assess her LVEDP, in case PCWP is inaccurate in the setting of her severe pulmonary hypertension.  In the meantime, I have recommended addition of ASA 81 mg daily.  Ongoing management of sarcoidosis and pulmonary hypertension per Dr. Lanney Gins.  Bases on results of cath and echo, we may need  to consider cardiac MRI +/- PET/CT for further evaluation of myocardial involvement with sarcoidosis.  Shared Decision Making/Informed Consent The risks [stroke (1 in 1000), death (1 in 1000), kidney failure [usually temporary] (1 in 500), bleeding (1 in 200), allergic reaction [possibly serious] (1 in 200)], benefits (diagnostic support and management of coronary artery disease) and alternatives of a cardiac catheterization were discussed in detail with Ms. Lusty and she is willing to proceed.  Follow-up: Return to clinic ~2 weeks after cath.  Nelva Bush, MD 11/24/2022 4:18 PM

## 2022-11-23 NOTE — Patient Instructions (Addendum)
Medication Instructions:  Your physician recommends the following medication changes.  START TAKING: Aspirin 81 mg by mouth daily (may purchase over the counter)   *If you need a refill on your cardiac medications before your next appointment, please call your pharmacy*   Lab Work: Your provider would like for you to return in 1 week prior to procedure to have the following labs drawn: (CBC, BMP).   Please go to the [facility] entrance and check in at the front desk.  You do not need an appointment.  They are open from 7am-6 pm.  You will not need to be fasting.    Testing/Procedures: Your physician has requested that you have an echocardiogram (prior to Cath). Echocardiography is a painless test that uses sound waves to create images of your heart. It provides your doctor with information about the size and shape of your heart and how well your heart's chambers and valves are working.   You may receive an ultrasound enhancing agent through an IV if needed to better visualize your heart during the echo. This procedure takes approximately one hour.  There are no restrictions for this procedure.  This will take place at [facility] ([place] [id number], [place]   Your physician has requested that you have a cardiac catheterization. Cardiac catheterization is used to diagnose and/or treat various heart conditions. Doctors may recommend this procedure for a number of different reasons. The most common reason is to evaluate chest pain. Chest pain can be a symptom of coronary artery disease (CAD), and cardiac catheterization can show whether plaque is narrowing or blocking your heart's arteries. This procedure is also used to evaluate the valves, as well as measure the blood flow and oxygen levels in different parts of your heart. For further information please visit [email or web address]. Please follow instruction sheet, as given.  Please see instructions  below  Follow-Up: At [facility], you and your health needs are our priority.  As part of our continuing mission to provide you with exceptional heart care, we have created designated Provider Care Teams.  These Care Teams include your primary Cardiologist (physician) and Advanced Practice Providers (APPs -  Physician Assistants and Nurse Practitioners) who all work together to provide you with the care you need, when you need it.  We recommend signing up for the patient portal called "MyChart".  Sign up information is provided on this After Visit Summary.  MyChart is used to connect with patients for Virtual Visits (Telemedicine).  Patients are able to view lab/test results, encounter notes, upcoming appointments, etc.  Non-urgent messages can be sent to your provider as well.   To learn more about what you can do with MyChart, go to [email or web address].    Your next appointment:   2 week(s) after Cath on 12/23/22  Provider:   You may see Nelva Bush, MD or one of the following Advanced Practice Providers on your designated Care Team:   Murray Hodgkins, NP Christell Faith, PA-C Cadence Kathlen Mody, PA-C Gerrie Nordmann, NP      [facility] [address] [place] [address] Dept: [place]: [phone]   Cardiac/Peripheral Catheterization   You are scheduled for a Cardiac Catheterization on Friday, March 8 with Dr. Harrell Gave End.  1. Arrive at the [facility] entrance at 6:30 am, one hour prior to your procedure. Free valet service is available.  After entering the [facility] please check-in at the registration desk (1st desk on  your right) to receive your armband. After receiving your armband someone will escort you to the cardiac cath/special procedures waiting area. The support person will be asked to wait in the waiting room.  It is OK to have someone drop you off and come back when you are ready to be discharged.        Special  note: Every effort is made to have your procedure done on time. Please understand that emergencies sometimes delay scheduled procedures.   . 2. Diet: Do not eat solid foods after midnight.  You may have clear liquids until 5 AM the day of the procedure.  3. Labs: You will need to have blood drawn 1 week prior to procedure (CBC, BMP)  4. Medication instructions in preparation for your procedure:   Contrast Allergy: No    On the morning of your procedure, take Aspirin 81 mg and any morning medicines NOT listed above.  You may use sips of water.  5. Plan to go home the same day, you will only stay overnight if medically necessary. 6. You MUST have a responsible adult to drive you home. 7. An adult MUST be with you the first 24 hours after you arrive home. 8. Bring a current list of your medications, and the last time and date medication taken. 9. Bring ID and current insurance cards. 10.Please wear clothes that are easy to get on and off and wear slip-on shoes.  Thank you for allowing [place] to care for you!   -- [facility]

## 2022-11-24 ENCOUNTER — Encounter: Payer: Self-pay | Admitting: Internal Medicine

## 2022-12-05 ENCOUNTER — Encounter: Payer: BC Managed Care – PPO | Admitting: *Deleted

## 2022-12-07 ENCOUNTER — Telehealth: Payer: Self-pay

## 2022-12-07 ENCOUNTER — Encounter: Payer: Self-pay | Admitting: *Deleted

## 2022-12-07 ENCOUNTER — Encounter: Payer: BC Managed Care – PPO | Admitting: *Deleted

## 2022-12-07 DIAGNOSIS — D860 Sarcoidosis of lung: Secondary | ICD-10-CM

## 2022-12-07 NOTE — Telephone Encounter (Signed)
Attempted to call patient to follow up on Pulmonary Rehab. Left message asking for callback.

## 2022-12-07 NOTE — Progress Notes (Signed)
Pulmonary Individual Treatment Plan  Patient Details  Name: HILMA CALZADO MRN: [id number] Date of Birth: 08/07/1956 Referring Provider:   Flowsheet Row Pulmonary Rehab from 07/25/2022 in [facility] Cardiac and Pulmonary Rehab  Referring Provider Ottie Glazier, MD       Initial Encounter Date:  Flowsheet Row Pulmonary Rehab from 07/25/2022 in [facility] Cardiac and Pulmonary Rehab  Date 07/25/22       Visit Diagnosis: Sarcoidosis of lung ([place])  Patient's Home Medications on Admission:  Current Outpatient Medications:    acetaminophen (TYLENOL) 500 MG tablet, Take 500 mg by mouth every 6 (six) hours as needed for moderate pain or headache., Disp: , Rfl:    albuterol (ACCUNEB) 1.25 MG/3ML nebulizer solution, Take 1 ampule by nebulization as needed., Disp: , Rfl:    aspirin EC 81 MG tablet, Take 1 tablet (81 mg total) by mouth daily. Swallow whole., Disp: 90 tablet, Rfl: 3   azithromycin (ZITHROMAX) 250 MG tablet, Take 250 mg by mouth 3 (three) times a week., Disp: , Rfl:    budesonide (PULMICORT) 0.25 MG/2ML nebulizer solution, Take 0.25 mg by nebulization daily as needed., Disp: , Rfl:    denosumab (PROLIA) 60 MG/ML SOSY injection, Inject 60 mg into the skin every 6 (six) months., Disp: , Rfl:    dorzolamide (TRUSOPT) 2 % ophthalmic solution, Place 1 drop into both eyes 2 (two) times daily., Disp: , Rfl:    escitalopram (LEXAPRO) 20 MG tablet, Take 1 tablet (20 mg total) by mouth daily., Disp: 90 tablet, Rfl: 3   Fluticasone-Umeclidin-Vilant (TRELEGY ELLIPTA) 200-62.5-25 MCG/ACT AEPB, Inhale into the lungs daily., Disp: , Rfl:    latanoprost (XALATAN) 0.005 % ophthalmic solution, Place 1 drop into the right eye 2 (two) times daily., Disp: , Rfl:    levothyroxine (SYNTHROID) 137 MCG tablet, Take 1 tablet (137 mcg total) by mouth daily before breakfast., Disp: 90 tablet, Rfl: 3   Multiple Vitamins-Minerals (MULTIVITAMIN WITH MINERALS) tablet, Take 1 tablet by mouth daily., Disp: , Rfl:     naproxen sodium (ALEVE) 220 MG tablet, Take 220 mg by mouth daily as needed (pain)., Disp: , Rfl:    NON FORMULARY, CPAP nightly, Disp: , Rfl:    OXYGEN, Inhale 2 L into the lungs daily as needed (During walking and activity). , Disp: , Rfl:    predniSONE (DELTASONE) 10 MG tablet, Take 2 tablets (20 mg total) by mouth daily. Resume only after the finishing new Rx for prednisone (Patient taking differently: Take 15 mg by mouth daily.), Disp: , Rfl:    Probiotic Product (PROBIOTIC PO), Take 1 capsule by mouth daily., Disp: , Rfl:    Treprostinil (TYVASO) 0.6 MG/ML SOLN, Inhale into the lungs See admin instructions. 12 breaths 4 times a day, Disp: , Rfl:   Past Medical History: Past Medical History:  Diagnosis Date   Acute renal failure ([place])    Acute respiratory failure ([place]) 10/23/2021   Acute respiratory failure with hypoxia (HCC)    ARF (acute respiratory failure) ([place]) 10/19/2021   Arrhythmia    patient unaware if this is current   Asthma    Chronic kidney disease    Critical lower limb ischemia (HCC)    Depression    GERD (gastroesophageal reflux disease)    Heart murmur    History of kidney stones    HOH (hard of hearing)    wear aids   Hyperthyroidism    Hypothyroidism    IBS (irritable bowel syndrome)    Pneumonia  Pulmonary hypertension ([place])    Sarcoid    Sarcoidosis    Seasonal allergies    Sleep apnea CPAP with O2   Stroke ([employer]) 07/2020   watershed   Wears hearing aid in both ears     Tobacco Use: Social History   Tobacco Use  Smoking Status Never  Smokeless Tobacco Never    Labs: Review Flowsheet  More data exists      Latest Ref Rng & Units 07/16/2020 07/21/2020 01/14/2021 10/19/2021 06/24/2022  Labs for ITP Cardiac and Pulmonary Rehab  Cholestrol 0 - 200 mg/dL - 289  - - -  LDL (calc) 0 - 99 mg/dL - 198  - - -  HDL-C >40 mg/dL - 33  - - -  Trlycerides <150 mg/dL - 290  - - -  Hemoglobin A1c 4.8 - 5.6 % 6.4  - - - 6.1   PH, Arterial 7.350 - 7.450 7.41  - -  - -  PCO2 arterial 32.0 - 48.0 mmHg 52  - - - -  Bicarbonate 20.0 - 28.0 mmol/L 33.0  - - 31.7  -  TCO2 22 - 32 mmol/L - - 30  - -  O2 Saturation % 95.2  - - 33.6  -     Pulmonary Assessment Scores:  Pulmonary Assessment Scores     Row Name 07/25/22 1331 08/15/22 1643       ADL UCSD   ADL Phase -- Entry    SOB Score total -- 88    Rest -- 0    Walk -- 3    Stairs -- 5    Bath -- 5    Dress -- 5    Shop -- 5      CAT Score   CAT Score -- 21      mMRC Score   mMRC Score 4 --             UCSD: Self-administered rating of dyspnea associated with activities of daily living (ADLs) 6-point scale (0 = "not at all" to 5 = "maximal or unable to do because of breathlessness")  Scoring Scores range from 0 to 120.  Minimally important difference is 5 units  CAT: CAT can identify the health impairment of COPD patients and is better correlated with disease progression.  CAT has a scoring range of zero to 40. The CAT score is classified into four groups of low (less than 10), medium (10 - 20), high (21-30) and very high (31-40) based on the impact level of disease on health status. A CAT score over 10 suggests significant symptoms.  A worsening CAT score could be explained by an exacerbation, poor medication adherence, poor inhaler technique, or progression of COPD or comorbid conditions.  CAT MCID is 2 points  mMRC: mMRC (Modified Medical Research Council) Dyspnea Scale is used to assess the degree of baseline functional disability in patients of respiratory disease due to dyspnea. No minimal important difference is established. A decrease in score of 1 point or greater is considered a positive change.   Pulmonary Function Assessment:   Exercise Target Goals: Exercise Program Goal: Individual exercise prescription set using results from initial 6 min walk test and THRR while considering  patient's activity barriers and safety.   Exercise Prescription Goal: Initial exercise  prescription builds to 30-45 minutes a day of aerobic activity, 2-3 days per week.  Home exercise guidelines will be given to patient during program as part of exercise prescription that the participant will acknowledge.  Education: Aerobic Exercise: - Group verbal and visual presentation on the components of exercise prescription. Introduces F.I.T.T principle from ACSM for exercise prescriptions.  Reviews F.I.T.T. principles of aerobic exercise including progression. Written material given at graduation. Flowsheet Row Pulmonary Rehab from 08/10/2018 in [facility] Cardiac and Pulmonary Rehab  Date 05/02/18  Educator [facility]  Instruction Review Code 1- Verbalizes Understanding       Education: Resistance Exercise: - Group verbal and visual presentation on the components of exercise prescription. Introduces F.I.T.T principle from ACSM for exercise prescriptions  Reviews F.I.T.T. principles of resistance exercise including progression. Written material given at graduation.    Education: Exercise & Equipment Safety: - Individual verbal instruction and demonstration of equipment use and safety with use of the equipment. Flowsheet Row Pulmonary Rehab from 08/10/2022 in [facility] Cardiac and Pulmonary Rehab  Date 07/25/22  Educator NT  Instruction Review Code 1- Verbalizes Understanding       Education: Exercise Physiology & General Exercise Guidelines: - Group verbal and written instruction with models to review the exercise physiology of the cardiovascular system and associated critical values. Provides general exercise guidelines with specific guidelines to those with heart or lung disease.  Flowsheet Row Pulmonary Rehab from 08/10/2018 in [facility] Cardiac and Pulmonary Rehab  Date 07/11/18  Educator [facility]  Instruction Review Code 1- Verbalizes Understanding       Education: Flexibility, Balance, Mind/Body Relaxation: - Group verbal and visual presentation with interactive activity on the components of  exercise prescription. Introduces F.I.T.T principle from ACSM for exercise prescriptions. Reviews F.I.T.T. principles of flexibility and balance exercise training including progression. Also discusses the mind body connection.  Reviews various relaxation techniques to help reduce and manage stress (i.e. Deep breathing, progressive muscle relaxation, and visualization). Balance handout provided to take home. Written material given at graduation. Flowsheet Row Pulmonary Rehab from 08/10/2018 in [facility] Cardiac and Pulmonary Rehab  Date 08/08/18  Educator AS  Instruction Review Code 1- Verbalizes Understanding       Activity Barriers & Risk Stratification:  Activity Barriers & Cardiac Risk Stratification - 07/25/22 1310       Activity Barriers & Cardiac Risk Stratification   Activity Barriers Other (comment);Shortness of Breath    Comments stroke 2021- left foot 3 toes amputated; broke elbow after tripping over a cord ("healed")             6 Minute Walk:  6 Minute Walk     Row Name 07/25/22 1306         6 Minute Walk   Phase Initial     Distance 705 feet     Walk Time 5.83 minutes     # of Rest Breaks 1     MPH 1.37     METS 2.25     RPE 15     Perceived Dyspnea  4     VO2 Peak 7.88     Symptoms Yes (comment)     Comments SOB     Resting HR 116 bpm     Resting BP 124/66     Resting Oxygen Saturation  91 %     Exercise Oxygen Saturation  during 6 min walk 84 %     Max Ex. HR 133 bpm     Max Ex. BP 140/80     2 Minute Post BP 128/72       Interval HR   1 Minute HR 116     2 Minute HR 127     3 Minute  HR 129     4 Minute HR 127     5 Minute HR 133     6 Minute HR 128     2 Minute Post HR 119     Interval Heart Rate? Yes       Interval Oxygen   Interval Oxygen? Yes     Baseline Oxygen Saturation % 91 %     1 Minute Oxygen Saturation % 91 %     1 Minute Liters of Oxygen 5 L  Pulsed     2 Minute Oxygen Saturation % 90 %     2 Minute Liters of Oxygen 5 L  Pulsed      3 Minute Oxygen Saturation % 84 %     3 Minute Liters of Oxygen 5 L  Pulsed     4 Minute Oxygen Saturation % 84 %     4 Minute Liters of Oxygen 5 L  Pulsed     5 Minute Oxygen Saturation % 86 %     5 Minute Liters of Oxygen 5 L  Pulsed     6 Minute Oxygen Saturation % 85 %     6 Minute Liters of Oxygen 5 L  Pulsed     2 Minute Post Oxygen Saturation % 90 %     2 Minute Post Liters of Oxygen 5 L  Pulsed             Oxygen Initial Assessment:  Oxygen Initial Assessment - 07/12/22 1334       Home Oxygen   Home Oxygen Device Portable Concentrator;Home Concentrator    Sleep Oxygen Prescription CPAP    Liters per minute 2    Home Exercise Oxygen Prescription Continuous    Liters per minute 2    Home Resting Oxygen Prescription Continuous   Doctor told her she could use prn during rest   Liters per minute 2    Compliance with Home Oxygen Use Yes      Intervention   Short Term Goals To learn and exhibit compliance with exercise, home and travel O2 prescription;To learn and understand importance of monitoring SPO2 with pulse oximeter and demonstrate accurate use of the pulse oximeter.;To learn and understand importance of maintaining oxygen saturations>88%;To learn and demonstrate proper pursed lip breathing techniques or other breathing techniques. ;To learn and demonstrate proper use of respiratory medications    Long  Term Goals Exhibits compliance with exercise, home  and travel O2 prescription;Verbalizes importance of monitoring SPO2 with pulse oximeter and return demonstration;Maintenance of O2 saturations>88%;Exhibits proper breathing techniques, such as pursed lip breathing or other method taught during program session;Compliance with respiratory medication;Demonstrates proper use of MDI's             Oxygen Re-Evaluation:  Oxygen Re-Evaluation     Row Name 07/25/22 1015 08/29/22 1131 09/19/22 1141         Program Oxygen Prescription   Program Oxygen Prescription --  Continuous Continuous     Liters per minute -- 2 2       Home Oxygen   Home Oxygen Device -- Portable Concentrator;Home Concentrator Portable Concentrator;Home Concentrator     Sleep Oxygen Prescription -- CPAP CPAP     Liters per minute -- 2 2     Home Exercise Oxygen Prescription -- Continuous Continuous     Liters per minute -- 2 2     Home Resting Oxygen Prescription -- Continuous Continuous     Liters per minute -- 2  2     Compliance with Home Oxygen Use -- Yes Yes       Goals/Expected Outcomes   Short Term Goals -- To learn and exhibit compliance with exercise, home and travel O2 prescription;To learn and understand importance of monitoring SPO2 with pulse oximeter and demonstrate accurate use of the pulse oximeter.;To learn and understand importance of maintaining oxygen saturations>88%;To learn and demonstrate proper pursed lip breathing techniques or other breathing techniques. ;To learn and demonstrate proper use of respiratory medications To learn and exhibit compliance with exercise, home and travel O2 prescription;To learn and understand importance of monitoring SPO2 with pulse oximeter and demonstrate accurate use of the pulse oximeter.;To learn and understand importance of maintaining oxygen saturations>88%;To learn and demonstrate proper pursed lip breathing techniques or other breathing techniques. ;To learn and demonstrate proper use of respiratory medications     Long  Term Goals -- Exhibits compliance with exercise, home  and travel O2 prescription;Verbalizes importance of monitoring SPO2 with pulse oximeter and return demonstration;Maintenance of O2 saturations>88%;Exhibits proper breathing techniques, such as pursed lip breathing or other method taught during program session;Compliance with respiratory medication;Demonstrates proper use of MDI's Exhibits compliance with exercise, home  and travel O2 prescription;Verbalizes importance of monitoring SPO2 with pulse oximeter and  return demonstration;Maintenance of O2 saturations>88%;Exhibits proper breathing techniques, such as pursed lip breathing or other method taught during program session;Compliance with respiratory medication;Demonstrates proper use of MDI's     Comments Reviewed PLB technique with pt.  Talked about how it works and it's importance in maintaining their exercise saturations. Reviewed PLB technique with pt.  Talked about how it works and it's importance in maintaining their exercise saturations. Tye [place] has a pulse oximeter at home and has been checking her O2 saturations. When she walks at home she reports that she does experience desaturation but she pauses to let her oxygen come back to normal levels. Tye [place] is doing well in rehab.  She is doing well on her oxgygen at home and saturations are staying up.  She is doing well with her CPAP but has started a new mask type that is not fitting as well.  She may go back to other form.  She is feeling good with her breathing and using her PLB consistently.     Goals/Expected Outcomes Short: Become more profiecient at using PLB.   Long: Become independent at using PLB. Short: Become more profiecient at using PLB.   Long: Become independent at using PLB. Short: Check on CPAP mask again Long: Conitnue to use PLB              Oxygen Discharge (Final Oxygen Re-Evaluation):  Oxygen Re-Evaluation - 09/19/22 1141       Program Oxygen Prescription   Program Oxygen Prescription Continuous    Liters per minute 2      Home Oxygen   Home Oxygen Device Portable Concentrator;Home Concentrator    Sleep Oxygen Prescription CPAP    Liters per minute 2    Home Exercise Oxygen Prescription Continuous    Liters per minute 2    Home Resting Oxygen Prescription Continuous    Liters per minute 2    Compliance with Home Oxygen Use Yes      Goals/Expected Outcomes   Short Term Goals To learn and exhibit compliance with exercise, home and travel O2 prescription;To learn and  understand importance of monitoring SPO2 with pulse oximeter and demonstrate accurate use of the pulse oximeter.;To learn and understand importance of maintaining oxygen  saturations>88%;To learn and demonstrate proper pursed lip breathing techniques or other breathing techniques. ;To learn and demonstrate proper use of respiratory medications    Long  Term Goals Exhibits compliance with exercise, home  and travel O2 prescription;Verbalizes importance of monitoring SPO2 with pulse oximeter and return demonstration;Maintenance of O2 saturations>88%;Exhibits proper breathing techniques, such as pursed lip breathing or other method taught during program session;Compliance with respiratory medication;Demonstrates proper use of MDI's    Comments Tye [place] is doing well in rehab.  She is doing well on her oxgygen at home and saturations are staying up.  She is doing well with her CPAP but has started a new mask type that is not fitting as well.  She may go back to other form.  She is feeling good with her breathing and using her PLB consistently.    Goals/Expected Outcomes Short: Check on CPAP mask again Long: Conitnue to use PLB             Initial Exercise Prescription:  Initial Exercise Prescription - 07/27/22 1100       Intensity   THRR 40-80% of Max Heartrate 118 - 141   New   Ratings of Perceived Exertion 11-13    Perceived Dyspnea 0-4             Perform Capillary Blood Glucose checks as needed.  Exercise Prescription Changes:   Exercise Prescription Changes     Row Name 07/25/22 1300 07/27/22 1100 08/09/22 1400 08/22/22 1100 09/20/22 1500     Response to Exercise   Blood Pressure (Admit) 124/66 -- 120/62 130/70 132/74   Blood Pressure (Exercise) 140/80 -- 138/76 138/62 128/72   Blood Pressure (Exit) 128/72 -- 140/82 134/72 122/64   Heart Rate (Admit) 116 bpm -- 97 bpm 96 bpm 103 bpm   Heart Rate (Exercise) 133 bpm -- 127 bpm 119 bpm 121 bpm   Heart Rate (Exit) 119 bpm -- 109 bpm  104 bpm 105 bpm   Oxygen Saturation (Admit) 91 % -- 96 % 95 % 97 %   Oxygen Saturation (Exercise) 84 % -- 89 % 91 % 89 %   Oxygen Saturation (Exit) 90 % -- 96 % 95 % 95 %   Rating of Perceived Exertion (Exercise) 14 -- 13 13 13   $ Perceived Dyspnea (Exercise) 4 -- 2 4 3   $ Symptoms SOB -- SOB SOB SOB   Comments 6MWT Results -- First 3 sessions in rehab -- --   Duration -- -- Continue with 30 min of aerobic exercise without signs/symptoms of physical distress. Continue with 30 min of aerobic exercise without signs/symptoms of physical distress. Continue with 30 min of aerobic exercise without signs/symptoms of physical distress.   Intensity -- THRR New  Changed 118-141 THRR unchanged THRR unchanged THRR unchanged     Progression   Progression -- -- Continue to progress workloads to maintain intensity without signs/symptoms of physical distress. Continue to progress workloads to maintain intensity without signs/symptoms of physical distress. Continue to progress workloads to maintain intensity without signs/symptoms of physical distress.   Average METs -- -- 1.9 2.07 2.03     Resistance Training   Training Prescription -- -- Yes Yes Yes   Weight -- -- 3 lb 3 lb 3 lb   Reps -- -- 10-15 10-15 10-15     Interval Training   Interval Training -- -- No No No     Oxygen   Oxygen -- -- Continuous Continuous Continuous   Liters -- -- 3  3 3-4     Treadmill   MPH -- 1.5 1.1 1.2 1   Grade -- 0 0 0 0   Minutes -- 15 15 15 15   $ METs -- 2.15 1.84 1.92 1.8     Recumbant Bike   Level -- -- -- -- 1.2   Watts -- -- -- -- 15   Minutes -- -- -- -- 15   METs -- -- -- -- 2.54     NuStep   Level -- -- -- 1 4   Minutes -- -- -- 15 15   METs -- -- -- 2 1.7     REL-XR   Level -- -- 1 1 1   $ Minutes -- -- 15 15 15   $ METs -- -- 2.5 2.9 2.4     T5 Nustep   Level -- -- 1 1 --   Minutes -- -- 15 15 --   METs -- -- 1.6 2 --     Oxygen   Maintain Oxygen Saturation -- -- 88% or higher 88% or higher  88% or higher    Row Name 10/04/22 1500 11/01/22 0800           Response to Exercise   Blood Pressure (Admit) 140/70 140/68      Blood Pressure (Exit) 124/70 132/62      Heart Rate (Admit) 107 bpm 104 bpm      Heart Rate (Exercise) 119 bpm 110 bpm      Heart Rate (Exit) 116 bpm 101 bpm      Oxygen Saturation (Admit) 94 % 90 %      Oxygen Saturation (Exercise) 89 % 93 %      Oxygen Saturation (Exit) 95 % 93 %      Rating of Perceived Exertion (Exercise) 12 14      Perceived Dyspnea (Exercise) 3 2      Symptoms SOB SOB      Duration Continue with 30 min of aerobic exercise without signs/symptoms of physical distress. Continue with 30 min of aerobic exercise without signs/symptoms of physical distress.      Intensity THRR unchanged THRR unchanged        Progression   Progression Continue to progress workloads to maintain intensity without signs/symptoms of physical distress. Continue to progress workloads to maintain intensity without signs/symptoms of physical distress.      Average METs 2.28 1.8        Resistance Training   Training Prescription Yes Yes      Weight 3 lb 3 lb      Reps 10-15 10-15        Interval Training   Interval Training No No        Oxygen   Oxygen Continuous Continuous      Liters 4 4        Treadmill   MPH 1.2 --      Grade 0 --      Minutes 15 --      METs 1.92 --        NuStep   Level 3 1      Minutes 15 15      METs 2 1.5        REL-XR   Level 1 1      Minutes 15 15      METs 3.2 2.1        Biostep-RELP   Level 1 --      Minutes 15 --  METs 2 --        Oxygen   Maintain Oxygen Saturation 88% or higher 88% or higher               Exercise Comments:   Exercise Comments     Row Name 07/25/22 1012           Exercise Comments First full day of exercise!  Patient was oriented to gym and equipment including functions, settings, policies, and procedures.  Patient's individual exercise prescription and treatment plan  were reviewed.  All starting workloads were established based on the results of the 6 minute walk test done at initial orientation visit.  The plan for exercise progression was also introduced and progression will be customized based on patient's performance and goals.                Exercise Goals and Review:   Exercise Goals     Row Name 07/25/22 1330             Exercise Goals   Increase Physical Activity Yes       Intervention Provide advice, education, support and counseling about physical activity/exercise needs.;Develop an individualized exercise prescription for aerobic and resistive training based on initial evaluation findings, risk stratification, comorbidities and participant's personal goals.       Expected Outcomes Short Term: Attend rehab on a regular basis to increase amount of physical activity.;Long Term: Add in home exercise to make exercise part of routine and to increase amount of physical activity.;Long Term: Exercising regularly at least 3-5 days a week.       Increase Strength and Stamina Yes       Intervention Provide advice, education, support and counseling about physical activity/exercise needs.;Develop an individualized exercise prescription for aerobic and resistive training based on initial evaluation findings, risk stratification, comorbidities and participant's personal goals.       Expected Outcomes Short Term: Increase workloads from initial exercise prescription for resistance, speed, and METs.;Short Term: Perform resistance training exercises routinely during rehab and add in resistance training at home;Long Term: Improve cardiorespiratory fitness, muscular endurance and strength as measured by increased METs and functional capacity (6MWT)       Able to understand and use rate of perceived exertion (RPE) scale Yes       Intervention Provide education and explanation on how to use RPE scale       Expected Outcomes Short Term: Able to use RPE daily in  rehab to express subjective intensity level;Long Term:  Able to use RPE to guide intensity level when exercising independently       Able to understand and use Dyspnea scale Yes       Intervention Provide education and explanation on how to use Dyspnea scale       Expected Outcomes Short Term: Able to use Dyspnea scale daily in rehab to express subjective sense of shortness of breath during exertion;Long Term: Able to use Dyspnea scale to guide intensity level when exercising independently       Knowledge and understanding of Target Heart Rate Range (THRR) Yes       Intervention Provide education and explanation of THRR including how the numbers were predicted and where they are located for reference       Expected Outcomes Short Term: Able to state/look up THRR;Short Term: Able to use daily as guideline for intensity in rehab;Long Term: Able to use THRR to govern intensity when exercising independently  Able to check pulse independently Yes       Intervention Provide education and demonstration on how to check pulse in carotid and radial arteries.;Review the importance of being able to check your own pulse for safety during independent exercise       Expected Outcomes Short Term: Able to explain why pulse checking is important during independent exercise;Long Term: Able to check pulse independently and accurately       Understanding of Exercise Prescription Yes       Intervention Provide education, explanation, and written materials on patient's individual exercise prescription       Expected Outcomes Short Term: Able to explain program exercise prescription;Long Term: Able to explain home exercise prescription to exercise independently                Exercise Goals Re-Evaluation :  Exercise Goals Re-Evaluation     [place] Name 07/25/22 1012 08/08/22 1108 08/09/22 1419 08/22/22 1200 08/29/22 1115     Exercise Goal Re-Evaluation   Exercise Goals Review Able to understand and use rate of  perceived exertion (RPE) scale;Able to understand and use Dyspnea scale;Knowledge and understanding of Target Heart Rate Range (THRR);Understanding of Exercise Prescription Understanding of Exercise Prescription;Increase Physical Activity;Increase Strength and Stamina Understanding of Exercise Prescription;Increase Physical Activity;Increase Strength and Stamina Understanding of Exercise Prescription;Increase Physical Activity;Increase Strength and Stamina Understanding of Exercise Prescription;Increase Physical Activity;Increase Strength and Stamina   Comments Reviewed RPE scale, THR and program prescription with pt today.  Pt voiced understanding and was given a copy of goals to take home. Tye Maryland feels that she is doing well with exercise. She states that she feels stronger since starting the program. She stated that she has not seen a huge benefit in her breathing but she wants to continue to work towards that goal. She does also state that she felt over exerted last week, but states it could have been due to being ill. We will continue to monitor her progress in the program. Tye Maryland is doing well in rehab. Her average overall MET level after her first 3 sessions was 1.9 METs. She also has done well walking on the treadmill at 1.1 mph without desaturating. She has tolerated 3 lb hand weights for resistance training as well. We will continue to monitor her progress in the program. Tye Maryland continues to do well in rehab. She was able to get her speed on the treadmill to 1.2 mph, though walking is challenging for her. She has not increased her level on other seated machines but all are reflecting appropriate RPEs. She should work toward increasing to level 2 on the T4 Nustep. Oxygen saturations are staying above 88%. Will continue to monitor. Tye Maryland is doing well in rehab. She states that she has more energy since starting the program. She has been walking on the treadmill at a speed of 1.2 mph, but has had some trouble  with her oxygen saturation dropping below 88. Tye Maryland has been doing some walking at home  about 2-3 times a week as well, and feels she has seen some improvement there as well. We will continue to monitor her progress in the program.   Expected Outcomes Short: Use RPE daily to regulate intensity.  Long: Follow program prescription in THR. Short: Continue to follow current exercise prescription. Long: Continue to increase strength and stamina. Short: Continue to increase workloads as tolerated. Long: Continue to increase strength and stamina. Short: Increase to level 2 on T4 Nustep. Long: Continue to increase overall MET  level Short: Continue to walk at home on days away from rehab. Long: Continue to increase strength and stamina.    [place] Name 09/19/22 1127 09/20/22 1503 10/04/22 1505 10/19/22 1124 11/01/22 0852     Exercise Goal Re-Evaluation   Exercise Goals Review Understanding of Exercise Prescription;Increase Physical Activity;Increase Strength and Stamina Understanding of Exercise Prescription;Increase Physical Activity;Increase Strength and Stamina Understanding of Exercise Prescription;Increase Physical Activity;Increase Strength and Stamina Understanding of Exercise Prescription;Increase Physical Activity;Increase Strength and Stamina Understanding of Exercise Prescription;Increase Physical Activity;Increase Strength and Stamina   Comments Tye Maryland is doing well in rehab.  She feels good with her exercise in class.  She is walking on deck at home on her off days.  She is also using her pedal machine at home.  She has been doing her stairs more at home.  She is feeling stronger overall and feeling better. Tye Maryland has been having intermittent attendance due to being sick. Once returned, she was able to increase to level 4 on the T4 Nustep. She would benefit from increasing her level on the XR. Perhaps we can also add a small incline to her TM as her speed has been consistent. Oxygen saturations are staying  above 88%. Will continue to monitor. Tye Maryland is doing well in the program. She recently increased her overall average MET level of 2.28 METs. She also was able to increase her speed back up to 1.2 mph on the treadmill. She also has kept her oxygen saturations above 88% since she began using 3L of O2 for exercise. We will continue to monitor her progress in the program. Tye Maryland has not attended since last review. She has been out sick and staff will continue to follow up with patient on when she is feeling better and able to come back. Tye Maryland has only attended rehab once since the last review. She returned from being sick and her workloads on both the T4 and XR were both down to level 1. She also was not able to do any walking on the treadmill during her first session back in rehab. We will continue to monitor her progress as she returns to consistent attendance.   Expected Outcomes Short: Conitnue to add in more walking at home Long: conitnue to improve stamina. Short: Increase level on XR Long: Continue to increase overall MET level Short: Increase level on XR. Long: Continue to improve strength and stamina. Short: Return to rehab and maintain good attendance Long: Graduate from [employer] Short: Increase workloads back up to the levels prior to sickness. Long: Continue to improve strength and stamina.    [place] Name 11/15/22 1238             Exercise Goal Re-Evaluation   Comments Out since last review                Discharge Exercise Prescription (Final Exercise Prescription Changes):  Exercise Prescription Changes - 11/01/22 0800       Response to Exercise   Blood Pressure (Admit) 140/68    Blood Pressure (Exit) 132/62    Heart Rate (Admit) 104 bpm    Heart Rate (Exercise) 110 bpm    Heart Rate (Exit) 101 bpm    Oxygen Saturation (Admit) 90 %    Oxygen Saturation (Exercise) 93 %    Oxygen Saturation (Exit) 93 %    Rating of Perceived Exertion (Exercise) 14    Perceived Dyspnea  (Exercise) 2    Symptoms SOB    Duration Continue with 30 min of  aerobic exercise without signs/symptoms of physical distress.    Intensity THRR unchanged      Progression   Progression Continue to progress workloads to maintain intensity without signs/symptoms of physical distress.    Average METs 1.8      Resistance Training   Training Prescription Yes    Weight 3 lb    Reps 10-15      Interval Training   Interval Training No      Oxygen   Oxygen Continuous    Liters 4      NuStep   Level 1    Minutes 15    METs 1.5      REL-XR   Level 1    Minutes 15    METs 2.1      Oxygen   Maintain Oxygen Saturation 88% or higher             Nutrition:  Target Goals: Understanding of nutrition guidelines, daily intake of sodium <1539m, cholesterol <2033m calories 30% from fat and 7% or less from saturated fats, daily to have 5 or more servings of fruits and vegetables.  Education: All About Nutrition: -Group instruction provided by verbal, written material, interactive activities, discussions, models, and posters to present general guidelines for heart healthy nutrition including fat, fiber, MyPlate, the role of sodium in heart healthy nutrition, utilization of the nutrition label, and utilization of this knowledge for meal planning. Follow up email sent as well. Written material given at graduation. [facility]  Date 06/27/18  Educator LB  Instruction Review Code 1- Verbalizes Understanding       Biometrics:  Pre Biometrics - 07/25/22 1330       Pre Biometrics   Height 5' 6"$  (1.676 m)    Weight 188 lb 9.6 oz (85.5 kg)    Waist Circumference 37 inches    Hip Circumference 44 inches    Waist to Hip Ratio 0.84 %    BMI (Calculated) 30.46              Nutrition Therapy Plan and Nutrition Goals:  Nutrition Therapy & Goals - 07/20/22 1517       Nutrition Therapy   Diet Heart healthy,  low Na, pulmonary MNT    Protein (specify units) 90-95g    Fiber 27 grams    Whole Grain Foods 3 servings    Saturated Fats 15 max. grams    Fruits and Vegetables 8 servings/day    Sodium 1.5 grams      Personal Nutrition Goals   Nutrition Goal ST: practice MyPlate guidelines (adding in 1-2 additional servings of non-starchy vegetables), be sure to include at least 1 good source of protein at meals and snacks, limit sodium by reading food labels and using other ways to flavor food LT: limit Na <1.5g/day, have at least 5 servings of vegetables/day    Comments [age].o. F admitted to pulmonary rehab for sarcoidosis of lung. PMHx includes hypothyroidism, HLD, CKD stg 3, asthma, HTN, OSA, elevated BG (A1C 6.4), anxiety. Relevant medications includes lexapro, synthroid, MVI, prednisone, probiotic, tyvaso, trilogy. B: healthy waffles occasionally. She will have eggs with sausage. S: cheese and crackers or yogurt (greek yogurt) L: egg salad or tuna salad with lettuce, rye bread. S: cheese and crackers or yogurt (greek yogurt) D: protein (meat or chicken) with a salad and more starchy vegetables. S: sometimes low calorie ice cream bar. She loves fruit and will have it  with most meals. She limits dairy as this can make her not feel great. She will use avocado oil and coconut aminos mostly, she does also use "no salt" which replaces sodium chloride with potassium choride. Drinks: Coffee (sometimes), water. She reports that her daughter is health conscious and has taken care of her for about 2 years - she is now going back to work. Discussed general heart healthy eating, sodium, and pulmonary MNT. Also reviewed ways to maintain a healthy BG. Advised to stop using the "no salt" and to decrease the salt in her processed foods as well as adding other ways to flavor her food. Since her protein needs are higher, suggested she have at least one good source of protein at each meal.      Intervention Plan   Intervention  Prescribe, educate and counsel regarding individualized specific dietary modifications aiming towards targeted core components such as weight, hypertension, lipid management, diabetes, heart failure and other comorbidities.    Expected Outcomes Short Term Goal: Understand basic principles of dietary content, such as calories, fat, sodium, cholesterol and nutrients.;Short Term Goal: A plan has been developed with personal nutrition goals set during dietitian appointment.;Long Term Goal: Adherence to prescribed nutrition plan.             Nutrition Assessments:  MEDIFICTS Score Key: ?70 Need to make dietary changes  40-70 Heart Healthy Diet ? 40 Therapeutic Level Cholesterol Diet  Flowsheet Row Pulmonary Rehab from 08/15/2022 in [facility] Pulmonary Rehab  Picture Your Plate Total Score on Admission 58      Picture Your Plate Scores: [id number] Unhealthy dietary pattern with much room for improvement. 41-50 Dietary pattern unlikely to meet recommendations for good health and room for improvement. 51-60 More healthful dietary pattern, with some room for improvement.  >60 Healthy dietary pattern, although there may be some specific behaviors that could be improved.   Nutrition Goals Re-Evaluation:  Nutrition Goals Re-Evaluation     [place] Name 08/08/22 1113 08/29/22 1124 09/19/22 1134         Goals   Nutrition Goal ST: practice MyPlate guidelines (adding in 1-2 additional servings of non-starchy vegetables), be sure to include at least 1 good source of protein at meals and snacks, limit sodium by reading food labels and using other ways to flavor food LT: limit Na <1.5g/day, have at least 5 servings of vegetables/day -- Short: She will continue to eat fruits and vegetables daily, and to read food labels. Long: Continue to follow healthy eating patterns discussed with RD.     Comment She has been making smoothies regularly and has been adding more vegetables than fruit to them to help  increase her intake. Including fruits and vegetables daily. She continues to read food labels regularly and feels that she has been able to maintain new healthier habits -- Tye Maryland is doing well with her diet.  Her weight is going back down now.  She is getting in more protein and getting in eggs and yogurt for breakfast. She is working on reading her food labels and watching sodium and sugar intake.  Her daughhter is helping her keep on top of her diet.     Expected Outcome Short: She will continue to eat fruits and vegetables daily, and to read food labels. Long: Continue to follow healthy eating patterns discussed with RD. Short: She will continue to eat fruits and vegetables daily, and to read food labels. Long: Continue to follow healthy eating patterns discussed with RD. Short:  Continue to work on adding in protein Long: Continue to work on healthy eating       Personal Goal #2 Re-Evaluation   Personal Goal #2 -- Continue to educate yourself on foods high in sodium and added sugar. Look for foods with less than ~10g added sugar per serving and less than ~222m sodium per serving --              Nutrition Goals Discharge (Final Nutrition Goals Re-Evaluation):  Nutrition Goals Re-Evaluation - 09/19/22 1134       Goals   Nutrition Goal Short: She will continue to eat fruits and vegetables daily, and to read food labels. Long: Continue to follow healthy eating patterns discussed with RD.    Comment CTye Marylandis doing well with her diet.  Her weight is going back down now.  She is getting in more protein and getting in eggs and yogurt for breakfast. She is working on reading her food labels and watching sodium and sugar intake.  Her daughhter is helping her keep on top of her diet.    Expected Outcome Short: Continue to work on adding in protein Long: Continue to work on healthy eating             Psychosocial: Target Goals: Acknowledge presence or absence of significant depression and/or  stress, maximize coping skills, provide positive support system. Participant is able to verbalize types and ability to use techniques and skills needed for reducing stress and depression.   Education: Stress, Anxiety, and Depression - Group verbal and visual presentation to define topics covered.  Reviews how body is impacted by stress, anxiety, and depression.  Also discusses healthy ways to reduce stress and to treat/manage anxiety and depression.  Written material given at graduation. [facility]  Date 08/10/22  [facility] Review Code 1- VUnited States Steel CorporationUnderstanding       Education: Sleep Hygiene -Provides group verbal and written instruction about how sleep can affect your health.  Define sleep hygiene, discuss sleep cycles and impact of sleep habits. Review good sleep hygiene tips.  [facility]  Date 04/11/18  Educator [facility] Instruction Review Code 1- Verbalizes Understanding       Initial Review & Psychosocial Screening:  Initial Psych Review & Screening - 07/12/22 1339       Initial Review   Current issues with Current Stress Concerns    Source of Stress Concerns Chronic Illness      Family Dynamics   Good Support System? Yes   family and friends     Barriers   Psychosocial barriers to participate in program There are no identifiable barriers or psychosocial needs.;The patient should benefit from training in stress management and relaxation.      Screening Interventions   Interventions Encouraged to exercise;Provide feedback about the scores to participant;To provide support and resources with identified psychosocial needs    Expected Outcomes Short Term goal: Utilizing psychosocial counselor, staff and physician to assist with identification of specific Stressors or current issues interfering with healing process. Setting  desired goal for each stressor or current issue identified.;Long Term Goal: Stressors or current issues are controlled or eliminated.;Short Term goal: Identification and review with participant of any Quality of Life or Depression concerns found by scoring the questionnaire.;Long Term goal: The participant improves quality of Life and PHQ9 Scores as seen by post scores and/or  verbalization of changes             Quality of Life Scores:  Scores of 19 and below usually indicate a poorer quality of life in these areas.  A difference of  2-3 points is a clinically meaningful difference.  A difference of 2-3 points in the total score of the Quality of Life Index has been associated with significant improvement in overall quality of life, self-image, physical symptoms, and general health in studies assessing change in quality of life.  PHQ-9: Review Flowsheet  More data exists      07/20/2022 06/24/2022 10/13/2021 09/28/2021 09/21/2020  Depression screen PHQ 2/9  Decreased Interest 0 0 0 0 0  Down, Depressed, Hopeless 0 0 0 0 0  PHQ - 2 Score 0 0 0 0 0  Altered sleeping 1 0 0 0 1  Tired, decreased energy 1 0 0 0 1  Change in appetite 0 0 0 0 0  Feeling bad or failure about yourself  0 0 0 0 0  Trouble concentrating 1 0 0 0 0  Moving slowly or fidgety/restless 0 0 0 0 0  Suicidal thoughts 0 0 0 0 0  PHQ-9 Score 3 0 0 0 2  Difficult doing work/chores Not difficult at all Not difficult at all Not difficult at all Not difficult at all Not difficult at all   Interpretation of Total Score  Total Score Depression Severity:  1-4 = Minimal depression, 5-9 = Mild depression, 10-14 = Moderate depression, 15-19 = Moderately severe depression, 20-27 = Severe depression   Psychosocial Evaluation and Intervention:  Psychosocial Evaluation - 07/12/22 1352       Psychosocial Evaluation & Interventions   Interventions Encouraged to exercise with the program and follow exercise prescription;Relaxation  education    Comments Tye Maryland is returning to pulmonary rehab by request. She had completed the program in 2019 and after having a stroke and increased breathing issues, she asked to come back. She wants to be able to play with her 11 and soon to be 12 grandkids, and states she knows her breathing may not get better but she wants to feel better.  Her strength and stamina are also things she wants to work on. She does not report any sleep concerns. She reports that sometimes she may feel down about all the medical things that have happened recently, but she doesn't stay there long because of her amazing support system.    Expected Outcomes SHort: attend pulmonary rehab for educaiton and exercise. Long: develop and maintain positive self care habits.    Continue Psychosocial Services  Follow up required by staff             Psychosocial Re-Evaluation:  Psychosocial Re-Evaluation     [place] Name 07/28/22 1130 08/29/22 1120 09/19/22 1129         Psychosocial Re-Evaluation   Current issues with Current Psychotropic Meds;Current Stress Concerns Current Psychotropic Meds;Current Stress Concerns Current Psychotropic Meds;Current Stress Concerns     Comments Tye [place] states that she is stressed due to her stroke and hear breathing issues. He has a positive outlook on improving her health. Her breathing has got worse over the last couple years because she was not able to exercise much. Tye [place] denies any major stressors at this time. She states that she has a good support system at home made up mostly by her husband and children. For stress relief she likes to read and study her Bible and play with her grandchildren.  She also feels that exercise has been a good stress releiver for her as well. Tye Maryland is doing well in rehab.  She is doing more stairs than she used to so she is not relying on her daughter as much now.  She says her older grandkids helped decorate for Christmas but went over the top, but she was  grateful for their help.  She does not have any major stressors currently.  She had a call from credit card to watch her account spending, but they were all legit charges.  She is still sleeping well. She is still taking time for herself for Bible study and spending time with her grandchildren.     Expected Outcomes Short: Start [name] to help with mood. Long: Maintain a healthy mental state Short: Continue [name] to help with mood. Long: Maintain a positive outlook. Short; COnitnue to exercise for menatl boost Long: Continue to stay positive     Interventions Relaxation education;Stress management education;Encouraged to attend Pulmonary Rehabilitation for the exercise Relaxation education;Stress management education;Encouraged to attend Pulmonary Rehabilitation for the exercise Relaxation education;Stress management education;Encouraged to attend Pulmonary Rehabilitation for the exercise     Continue Psychosocial Services  Follow up required by staff Follow up required by staff Follow up required by staff              Psychosocial Discharge (Final Psychosocial Re-Evaluation):  Psychosocial Re-Evaluation - 09/19/22 1129       Psychosocial Re-Evaluation   Current issues with Current Psychotropic Meds;Current Stress Concerns    Comments Tye Maryland is doing well in rehab.  She is doing more stairs than she used to so she is not relying on her daughter as much now.  She says her older grandkids helped decorate for Christmas but went over the top, but she was grateful for their help.  She does not have any major stressors currently.  She had a call from credit card to watch her account spending, but they were all legit charges.  She is still sleeping well. She is still taking time for herself for Bible study and spending time with her grandchildren.    Expected Outcomes Short; COnitnue to exercise for menatl boost Long: Continue to stay positive    Interventions Relaxation education;Stress management  education;Encouraged to attend Pulmonary Rehabilitation for the exercise    Continue Psychosocial Services  Follow up required by staff             Education: Education Goals: Education classes will be provided on a weekly basis, covering required topics. Participant will state understanding/return demonstration of topics presented.  Learning Barriers/Preferences:   General Pulmonary Education Topics:  Infection Prevention: - Provides verbal and written material to individual with discussion of infection control including proper hand washing and proper equipment cleaning during exercise session. Flowsheet Row Pulmonary Rehab from 08/10/2022 in [facility] Cardiac and Pulmonary Rehab  Date 07/27/22  Educator NT  Instruction Review Code 1- Verbalizes Understanding       Falls Prevention: - Provides verbal and written material to individual with discussion of falls prevention and safety. Flowsheet Row Pulmonary Rehab from 08/10/2022 in [facility] Cardiac and Pulmonary Rehab  Date 07/27/22  Educator NT  Instruction Review Code 1- Verbalizes Understanding       Chronic Lung Disease Review: - Group verbal instruction with posters, models, PowerPoint presentations and videos,  to review new updates, new respiratory medications, new advancements in procedures and treatments. Providing information on websites and "800" numbers for continued self-education. Includes information  about supplement oxygen, available portable oxygen systems, continuous and intermittent flow rates, oxygen safety, concentrators, and Medicare reimbursement for oxygen. Explanation of Pulmonary Drugs, including class, frequency, complications, importance of spacers, rinsing mouth after steroid MDI's, and proper cleaning methods for nebulizers. Review of basic lung anatomy and physiology related to function, structure, and complications of lung disease. Review of risk factors. Discussion about methods for diagnosing sleep apnea  and types of masks and machines for OSA. Includes a review of the use of types of environmental controls: home humidity, furnaces, filters, dust mite/pet prevention, HEPA vacuums. Discussion about weather changes, air quality and the benefits of nasal washing. Instruction on Warning signs, infection symptoms, calling MD promptly, preventive modes, and value of vaccinations. Review of effective airway clearance, coughing and/or vibration techniques. Emphasizing that all should Create an Action Plan. Written material given at graduation. Flowsheet Row Pulmonary Rehab from 08/10/2018 in [facility] Cardiac and Pulmonary Rehab  Date 06/29/18  Educator [facility]  Instruction Review Code 1- Verbalizes Understanding       AED/CPR: - Group verbal and written instruction with the use of models to demonstrate the basic use of the AED with the basic ABC's of resuscitation.    Anatomy and Cardiac Procedures: - Group verbal and visual presentation and models provide information about basic cardiac anatomy and function. Reviews the testing methods done to diagnose heart disease and the outcomes of the test results. Describes the treatment choices: Medical Management, Angioplasty, or Coronary Bypass Surgery for treating various heart conditions including Myocardial Infarction, Angina, Valve Disease, and Cardiac Arrhythmias.  Written material given at graduation. Flowsheet Row Pulmonary Rehab from 08/10/2018 in [facility] Cardiac and Pulmonary Rehab  Date 05/16/18  Educator [facility]  Instruction Review Code 5- Refused Teaching       Medication Safety: - Group verbal and visual instruction to review commonly prescribed medications for heart and lung disease. Reviews the medication, class of the drug, and side effects. Includes the steps to properly store meds and maintain the prescription regimen.  Written material given at graduation. Flowsheet Row Pulmonary Rehab from 08/10/2018 in [facility] Cardiac and Pulmonary Rehab  Date 08/10/18   Educator [facility]  Instruction Review Code 1- Verbalizes Understanding       Other: -Provides group and verbal instruction on various topics (see comments)   Knowledge Questionnaire Score:  Knowledge Questionnaire Score - 08/15/22 1642       Knowledge Questionnaire Score   Pre Score 11/18              Core Components/Risk Factors/Patient Goals at Admission:  Personal Goals and Risk Factors at Admission - 07/12/22 1337       Core Components/Risk Factors/Patient Goals on Admission    Weight Management --    Intervention --    Expected Outcomes --    Improve shortness of breath with ADL's Yes    Intervention Provide education, individualized exercise plan and daily activity instruction to help decrease symptoms of SOB with activities of daily living.    Expected Outcomes Short Term: Improve cardiorespiratory fitness to achieve a reduction of symptoms when performing ADLs;Long Term: Be able to perform more ADLs without symptoms or delay the onset of symptoms    Hypertension Yes    Intervention Provide education on lifestyle modifcations including regular physical activity/exercise, weight management, moderate sodium restriction and increased consumption of fresh fruit, vegetables, and low fat dairy, alcohol moderation, and smoking cessation.;Monitor prescription use compliance.    Expected Outcomes Short Term: Continued assessment and intervention  until BP is < 140/18m HG in hypertensive participants. < 130/84mHG in hypertensive participants with diabetes, heart failure or chronic kidney disease.;Long Term: Maintenance of blood pressure at goal levels.    Lipids Yes    Intervention Provide education and support for participant on nutrition & aerobic/resistive exercise along with prescribed medications to achieve LDL <7076mHDL >61m53m  Expected Outcomes Short Term: Participant states understanding of desired cholesterol values and is compliant with medications prescribed.  Participant is following exercise prescription and nutrition guidelines.;Long Term: Cholesterol controlled with medications as prescribed, with individualized exercise RX and with personalized nutrition plan. Value goals: LDL < 70mg61mL > 40 mg.             Education:Diabetes - Individual verbal and written instruction to review signs/symptoms of diabetes, desired ranges of glucose level fasting, after meals and with exercise. Acknowledge that pre and post exercise glucose checks will be done for 3 sessions at entry of program.   Know Your Numbers and Heart Failure: - Group verbal and visual instruction to discuss disease risk factors for cardiac and pulmonary disease and treatment options.  Reviews associated critical values for Overweight/Obesity, Hypertension, Cholesterol, and Diabetes.  Discusses basics of heart failure: signs/symptoms and treatments.  Introduces Heart Failure Zone chart for action plan for heart failure.  Written material given at graduation. Flowsheet Row [facility]  Date 07/27/22  Educator [facility]  Instruction Review Code 1- Verbalizes Understanding       Core Components/Risk Factors/Patient Goals Review:   Goals and Risk Factor Review     Row Name 07/28/22 1127 08/08/22 1114 08/29/22 1125 09/19/22 1137       Core Components/Risk Factors/Patient Goals Review   Personal Goals Review Improve shortness of breath with ADL's Improve shortness of breath with ADL's Improve shortness of breath with ADL's;Weight Management/Obesity Improve shortness of breath with ADL's;Weight Management/Obesity;Increase knowledge of respiratory medications and ability to use respiratory devices properly.    Review Spoke to patient about their shortness of breath and what they can do to improve. Patient has been informed of breathing techniques when starting the program. Patient is informed to tell staff if they have had any med changes  and that certain meds they are taking or not taking can be causing shortness of breath. Spoke to patient about their shortness of breath and what they can do to improve. Patient has been informed of breathing techniques when starting the program. Patient is informed to tell staff if they have had any med changes and that certain meds they are taking or not taking can be causing shortness of breath. CathyTye Marylandes that she would like to lose a little bit of weight with a goal of 165-170 lbs. She also has been monitoring her O2 saturations at home and reports that they are staying above 95. She also states that she has experienced very little SOB with her ADL's. CathyTye Marylandnot been checking her BP at home but does have a BP cuff and was encouraged to start monitoring it at home. CathyTye Marylandoing well in rehab.  Her weight is starting to come back down.  Her breathing is doing better, she is able to get up and down more and doing more going up the stairs and needing less help from her daughter. She is doing well on her meds.  She has only been using her nebulizer when she needs it but hasn't needed it much.  She had esphogeal spasms recently and has found that peppermint oil helps relieve her symptoms.  She has linked it to either pepper or noddle/rice getting caught.    Expected Outcomes Short: Attend [name] regularly to improve shortness of breath with ADL's. Long: maintain independence with ADL's Short: Attend [name] regularly to improve shortness of breath with ADL's. Long: maintain independence with ADL's Short: Attend [name] regularly to improve shortness of breath with ADL's. Long: Continue to monitor lifestyle risk factors. Short; COntinue to work on weight loss lOng: conitnue to work on breathing             Core Components/Risk Factors/Patient Goals at Discharge (Final Review):   Goals and Risk Factor Review - 09/19/22 1137       Core Components/Risk Factors/Patient Goals Review   Personal  Goals Review Improve shortness of breath with ADL's;Weight Management/Obesity;Increase knowledge of respiratory medications and ability to use respiratory devices properly.    Review Tye Maryland is doing well in rehab.  Her weight is starting to come back down.  Her breathing is doing better, she is able to get up and down more and doing more going up the stairs and needing less help from her daughter. She is doing well on her meds.  She has only been using her nebulizer when she needs it but hasn't needed it much.  She had esphogeal spasms recently and has found that peppermint oil helps relieve her symptoms.  She has linked it to either pepper or noddle/rice getting caught.    Expected Outcomes Short; COntinue to work on weight loss lOng: conitnue to work on breathing             ITP Comments:  ITP Comments     Row Name 07/12/22 1346 07/20/22 1546 07/25/22 1012 08/17/22 0942 09/14/22 0810   ITP Comments Initial telephone orientation completed. Diagnosis can be found in [facility] 8/16. EP orientation scheduled for Wednesday 10/4 at 2:30. Patient presented for orientation today, 10/4. Patient wore open-toed shoes and therefore could not complete 6MWT. Will do 6MWT on Monday, 10/9. First full day of exercise!  Patient was oriented to gym and equipment including functions, settings, policies, and procedures.  Patient's individual exercise prescription and treatment plan were reviewed.  All starting workloads were established based on the results of the 6 minute walk test done at initial orientation visit.  The plan for exercise progression was also introduced and progression will be customized based on patient's performance and goals. 30 Day review completed. Medical Director ITP review done, changes made as directed, and signed approval by Medical Director.   NEW TO PROGRAM 30 Day review completed. Medical Director ITP review done, changes made as directed, and signed approval by Medical Director.    [place] Name  10/12/22 1023 10/13/22 1444 11/09/22 0942 11/14/22 1617 12/07/22 1304   ITP Comments 30 Day review completed. Medical Director ITP review done, changes made as directed, and signed approval by Medical Director. Called to check on patient.  She has been out sick.    It has gone through the whole family.  She is feeling better and hopes to return soon. 30 Day review completed. Medical Director ITP review done, changes made as directed, and signed approval by Medical Director.   remains out Called to check on patient.  Last attended on 10/26/22.  She saw her pulmonologist and they think something may be going on with her heart and requested that she hold off until meeting with cardiology next week  and is scheduled for a cath possibly. She is now on hold until we hear from cardiology. 30 day review completed. ITP sent to Dr. Zetta Bills, Medical Director of  Pulmonary Rehab. Continue with ITP unless changes are made by physician.  Pt currently out on medical hold for heart workup. Pt is scheduled for cath next month.            Comments: 30 day review

## 2022-12-12 ENCOUNTER — Ambulatory Visit: Payer: BC Managed Care – PPO

## 2022-12-19 DIAGNOSIS — I10 Essential (primary) hypertension: Secondary | ICD-10-CM | POA: Diagnosis not present

## 2022-12-19 DIAGNOSIS — G4733 Obstructive sleep apnea (adult) (pediatric): Secondary | ICD-10-CM | POA: Diagnosis not present

## 2022-12-20 ENCOUNTER — Other Ambulatory Visit
Admission: RE | Admit: 2022-12-20 | Discharge: 2022-12-20 | Disposition: A | Discharge status: Home / Self Care | Payer: BC Managed Care – PPO | Source: Home / Self Care | Attending: Internal Medicine | Admitting: Internal Medicine

## 2022-12-20 DIAGNOSIS — M810 Age-related osteoporosis without current pathological fracture: Secondary | ICD-10-CM | POA: Diagnosis not present

## 2022-12-20 DIAGNOSIS — R079 Chest pain, unspecified: Secondary | ICD-10-CM

## 2022-12-20 DIAGNOSIS — E785 Hyperlipidemia, unspecified: Secondary | ICD-10-CM | POA: Diagnosis not present

## 2022-12-20 DIAGNOSIS — D869 Sarcoidosis, unspecified: Secondary | ICD-10-CM | POA: Diagnosis not present

## 2022-12-20 DIAGNOSIS — J9611 Chronic respiratory failure with hypoxia: Secondary | ICD-10-CM | POA: Diagnosis not present

## 2022-12-20 DIAGNOSIS — I272 Pulmonary hypertension, unspecified: Secondary | ICD-10-CM | POA: Diagnosis not present

## 2022-12-20 LAB — CBC
HCT: 42.7 % (ref 36.0–46.0)
Hemoglobin: 13.8 g/dL (ref 12.0–15.0)
MCH: 28.8 pg (ref 26.0–34.0)
MCHC: 32.3 g/dL (ref 30.0–36.0)
MCV: 89.1 fL (ref 80.0–100.0)
Platelets: 296 10*3/uL (ref 150–400)
RBC: 4.79 MIL/uL (ref 3.87–5.11)
RDW: 13.2 % (ref 11.5–15.5)
WBC: 15 10*3/uL — ABNORMAL HIGH (ref 4.0–10.5)
nRBC: 0 % (ref 0.0–0.2)

## 2022-12-20 LAB — BASIC METABOLIC PANEL
Anion gap: 11 (ref 5–15)
BUN: 30 mg/dL — ABNORMAL HIGH (ref 8–23)
CO2: 27 mmol/L (ref 22–32)
Calcium: 9.4 mg/dL (ref 8.9–10.3)
Chloride: 101 mmol/L (ref 98–111)
Creatinine, Ser: 1.18 mg/dL — ABNORMAL HIGH (ref 0.44–1.00)
GFR, Estimated: 51 mL/min — ABNORMAL LOW (ref >60)
Glucose, Bld: 144 mg/dL — ABNORMAL HIGH (ref 70–99)
Potassium: 3.8 mmol/L (ref 3.5–5.1)
Sodium: 139 mmol/L (ref 135–145)

## 2022-12-22 ENCOUNTER — Telehealth: Payer: Self-pay | Admitting: Internal Medicine

## 2022-12-22 NOTE — Telephone Encounter (Signed)
Right and left heart catheterization and coronary angiography scheduled for tomorrow was initially denied by insurance.  I have completed a peer-to-peer and received authorization for procedure to be done at [facility] by 01/17/2023).  Authorization #: [id number]  We will move forward with catheterization as planned tomorrow.  Nelva Bush, MD [facility]

## 2022-12-22 NOTE — Telephone Encounter (Signed)
Message sent to pre-authorization department to make aware.

## 2022-12-23 ENCOUNTER — Other Ambulatory Visit: Payer: Self-pay

## 2022-12-23 ENCOUNTER — Encounter
Admission: RE | Disposition: A | Discharge status: Home / Self Care | Payer: BC Managed Care – PPO | Source: Home / Self Care | Attending: Internal Medicine

## 2022-12-23 ENCOUNTER — Encounter: Payer: Self-pay | Admitting: Internal Medicine

## 2022-12-23 ENCOUNTER — Ambulatory Visit
Admission: RE | Admit: 2022-12-23 | Discharge: 2022-12-23 | Disposition: A | Discharge status: Home / Self Care | Payer: BC Managed Care – PPO | Attending: Internal Medicine | Admitting: Internal Medicine

## 2022-12-23 DIAGNOSIS — E785 Hyperlipidemia, unspecified: Secondary | ICD-10-CM | POA: Diagnosis not present

## 2022-12-23 DIAGNOSIS — M810 Age-related osteoporosis without current pathological fracture: Secondary | ICD-10-CM | POA: Diagnosis not present

## 2022-12-23 DIAGNOSIS — D869 Sarcoidosis, unspecified: Secondary | ICD-10-CM | POA: Insufficient documentation

## 2022-12-23 DIAGNOSIS — R0602 Shortness of breath: Secondary | ICD-10-CM | POA: Diagnosis not present

## 2022-12-23 DIAGNOSIS — R079 Chest pain, unspecified: Secondary | ICD-10-CM

## 2022-12-23 DIAGNOSIS — J9611 Chronic respiratory failure with hypoxia: Secondary | ICD-10-CM | POA: Insufficient documentation

## 2022-12-23 DIAGNOSIS — I272 Pulmonary hypertension, unspecified: Secondary | ICD-10-CM | POA: Insufficient documentation

## 2022-12-23 HISTORY — PX: RIGHT/LEFT HEART CATH AND CORONARY ANGIOGRAPHY: CATH118266

## 2022-12-23 LAB — POCT I-STAT EG7
Acid-Base Excess: 4 mmol/L — ABNORMAL HIGH (ref 0.0–2.0)
Bicarbonate: 30 mmol/L — ABNORMAL HIGH (ref 20.0–28.0)
Calcium, Ion: 1.29 mmol/L (ref 1.15–1.40)
HCT: 38 % (ref 36.0–46.0)
Hemoglobin: 12.9 g/dL (ref 12.0–15.0)
O2 Saturation: 65 %
Potassium: 3.2 mmol/L — ABNORMAL LOW (ref 3.5–5.1)
Sodium: 142 mmol/L (ref 135–145)
TCO2: 32 mmol/L (ref 22–32)
pCO2, Ven: 50.1 mmHg (ref 44–60)
pH, Ven: 7.386 (ref 7.25–7.43)
pO2, Ven: 35 mmHg (ref 32–45)

## 2022-12-23 LAB — POCT I-STAT 7, (LYTES, BLD GAS, ICA,H+H)
Acid-Base Excess: 3 mmol/L — ABNORMAL HIGH (ref 0.0–2.0)
Bicarbonate: 28.3 mmol/L — ABNORMAL HIGH (ref 20.0–28.0)
Calcium, Ion: 1.24 mmol/L (ref 1.15–1.40)
HCT: 37 % (ref 36.0–46.0)
Hemoglobin: 12.6 g/dL (ref 12.0–15.0)
O2 Saturation: 95 %
Potassium: 3.1 mmol/L — ABNORMAL LOW (ref 3.5–5.1)
Sodium: 142 mmol/L (ref 135–145)
TCO2: 30 mmol/L (ref 22–32)
pCO2 arterial: 44.3 mmHg (ref 32–48)
pH, Arterial: 7.413 (ref 7.35–7.45)
pO2, Arterial: 77 mmHg — ABNORMAL LOW (ref 83–108)

## 2022-12-23 SURGERY — RIGHT/LEFT HEART CATH AND CORONARY ANGIOGRAPHY
Anesthesia: Moderate Sedation | Laterality: Bilateral

## 2022-12-23 MED ORDER — VERAPAMIL HCL 2.5 MG/ML IV SOLN
INTRAVENOUS | Status: DC | PRN
  Administered 2022-12-23 (×2): 2.5 mg via INTRA_ARTERIAL

## 2022-12-23 MED ORDER — SODIUM CHLORIDE 0.9 % IV SOLN: INTRAVENOUS | Status: DC

## 2022-12-23 MED ORDER — HEPARIN SODIUM (PORCINE) 1000 UNIT/ML IJ SOLN
INTRAMUSCULAR | Status: DC | PRN
  Administered 2022-12-23: 4000 [IU] via INTRAVENOUS

## 2022-12-23 MED ORDER — HEPARIN (PORCINE) IN NACL 1000-0.9 UT/500ML-% IV SOLN
INTRAVENOUS | Status: AC
  Filled 2022-12-23: qty 1000

## 2022-12-23 MED ORDER — SODIUM CHLORIDE 0.9% FLUSH: 3.0000 mL | Freq: Two times a day (BID) | INTRAVENOUS | Status: DC

## 2022-12-23 MED ORDER — VERAPAMIL HCL 2.5 MG/ML IV SOLN
INTRAVENOUS | Status: AC
  Filled 2022-12-23: qty 2

## 2022-12-23 MED ORDER — ACETAMINOPHEN 325 MG PO TABS: 650.0000 mg | ORAL_TABLET | ORAL | Status: DC | PRN

## 2022-12-23 MED ORDER — HEPARIN SODIUM (PORCINE) 1000 UNIT/ML IJ SOLN
INTRAMUSCULAR | Status: AC
  Filled 2022-12-23: qty 10

## 2022-12-23 MED ORDER — FENTANYL CITRATE (PF) 100 MCG/2ML IJ SOLN
INTRAMUSCULAR | Status: AC
  Filled 2022-12-23: qty 2

## 2022-12-23 MED ORDER — MIDAZOLAM HCL 2 MG/2ML IJ SOLN
INTRAMUSCULAR | Status: AC
  Filled 2022-12-23: qty 2

## 2022-12-23 MED ORDER — HEPARIN (PORCINE) IN NACL 1000-0.9 UT/500ML-% IV SOLN
INTRAVENOUS | Status: DC | PRN
  Administered 2022-12-23 (×2): 500 mL

## 2022-12-23 MED ORDER — MIDAZOLAM HCL 2 MG/2ML IJ SOLN
INTRAMUSCULAR | Status: DC | PRN
  Administered 2022-12-23: .5 mg via INTRAVENOUS

## 2022-12-23 MED ORDER — SODIUM CHLORIDE 0.9% FLUSH: 3.0000 mL | INTRAVENOUS | Status: DC | PRN

## 2022-12-23 MED ORDER — ONDANSETRON HCL 4 MG/2ML IJ SOLN: 4.0000 mg | Freq: Four times a day (QID) | INTRAMUSCULAR | Status: DC | PRN

## 2022-12-23 MED ORDER — SODIUM CHLORIDE 0.9 % IV SOLN: 250.0000 mL | INTRAVENOUS | Status: DC | PRN

## 2022-12-23 MED ORDER — IOHEXOL 300 MG/ML  SOLN
INTRAMUSCULAR | Status: DC | PRN
  Administered 2022-12-23: 24 mL

## 2022-12-23 MED ORDER — FENTANYL CITRATE (PF) 100 MCG/2ML IJ SOLN
INTRAMUSCULAR | Status: DC | PRN
  Administered 2022-12-23: 12.5 ug via INTRAVENOUS

## 2022-12-23 MED ORDER — ASPIRIN 81 MG PO CHEW: 81.0000 mg | CHEWABLE_TABLET | ORAL | Status: DC

## 2022-12-23 MED ORDER — HYDRALAZINE HCL 20 MG/ML IJ SOLN: 10.0000 mg | INTRAMUSCULAR | Status: DC | PRN

## 2022-12-23 MED ORDER — LABETALOL HCL 5 MG/ML IV SOLN: 10.0000 mg | INTRAVENOUS | Status: DC | PRN

## 2022-12-23 SURGICAL SUPPLY — 14 items
CATH 5FR JL3.5 JR4 ANG PIG MP (CATHETERS) IMPLANT
CATH BALLN WEDGE 5F 110CM (CATHETERS) IMPLANT
DEVICE RAD TR BAND REGULAR (VASCULAR PRODUCTS) IMPLANT
DRAPE BRACHIAL (DRAPES) IMPLANT
GLIDESHEATH SLEND SS 6F .021 (SHEATH) IMPLANT
GUIDEWIRE .025 260CM (WIRE) IMPLANT
GUIDEWIRE INQWIRE 1.5J.035X260 (WIRE) IMPLANT
INQWIRE 1.5J .035X260CM (WIRE) ×1
KIT ENCORE 26 ADVANTAGE (KITS) IMPLANT
PACK CARDIAC CATH (CUSTOM PROCEDURE TRAY) ×1 IMPLANT
PROTECTION STATION PRESSURIZED (MISCELLANEOUS) ×1
SET ATX-X65L (MISCELLANEOUS) IMPLANT
SHEATH GLIDE SLENDER 4/5FR (SHEATH) IMPLANT
STATION PROTECTION PRESSURIZED (MISCELLANEOUS) IMPLANT

## 2022-12-23 NOTE — Brief Op Note (Signed)
BRIEF CARDIAC CATHETERIZATION NOTE  12/23/2022  9:09 AM  PATIENT:  Stacie [name]  67 y.o. female  PRE-OPERATIVE DIAGNOSIS:  Pulmonary hypertension, shortness of breath and chest pain  POST-OPERATIVE DIAGNOSIS:  Same  PROCEDURE:  Procedure(s): RIGHT/LEFT HEART CATH AND CORONARY ANGIOGRAPHY (Bilateral)  SURGEON:  Surgeon(s) and Role:    * Briya Lookabaugh, MD - Primary  FINDINGS: Minimal luminal irregularities in LAD.  No angiographically significant CAD. Normal left heart filling pressures. Mild pulmonary hypertension.  RECOMMENDATIONS: Continue primary prevention of CAD. Ongoing management of pulmonary hypertension per Dr. Lanney Gins.  Nelva Bush, MD [facility]

## 2022-12-23 NOTE — Discharge Instructions (Addendum)
Radial Site Care Refer to this sheet in the next few weeks. These instructions provide you with information about caring for yourself after your procedure. Your health care provider may also give you more specific instructions. Your treatment has been planned according to current medical practices, but problems sometimes occur. Call your health care provider if you have any problems or questions after your procedure. What can I expect after the procedure? After your procedure, it is typical to have the following: Bruising at the radial site that usually fades within 1-2 weeks. Blood collecting in the tissue (hematoma) that may be painful to the touch. It should usually decrease in size and tenderness within 1-2 weeks.  Follow these instructions at home: Take medicines only as directed by your health care provider. If you are on a medication called Metformin please do not take for 48 hours after your procedure. Over the next 48hrs please increase your fluid intake of water and non caffeine beverages to flush the contrast dye out of your system.  You may shower 24 hours after the procedure  Leave your bandage on and gently wash the site with plain soap and water. Pat the area dry with a clean towel. Do not rub the site, because this may cause bleeding.  Remove your dressing 48hrs after your procedure and leave open to air.  Do not submerge your site in water for 7 days. This includes swimming and washing dishes.  Check your insertion site every day for redness, swelling, or drainage. Do not apply powder or lotion to the site. Do not flex or bend the affected arm for 24 hours or as directed by your health care provider. Do not push or pull heavy objects with the affected arm for 24 hours or as directed by your health care provider. Do not lift over 10 lb (4.5 kg) for 5 days after your procedure or as directed by your health care provider. Ask your health care provider when it is okay to: Return to  work or school. Resume usual physical activities or sports. Resume sexual activity. Do not drive home if you are discharged the same day as the procedure. Have someone else drive you. You may drive 48 hours after the procedure Do not operate machinery or power tools for 24 hours after the procedure. If your procedure was done as an outpatient procedure, which means that you went home the same day as your procedure, a responsible adult should be with you for the first 24 hours after you arrive home. Keep all follow-up visits as directed by your health care provider. This is important. Contact a health care provider if: You have a fever. You have chills. You have increased bleeding from the radial site. Hold pressure on the site. Get help right away if: You have unusual pain at the radial site. You have redness, warmth, or swelling at the radial site. You have drainage (other than a small amount of blood on the dressing) from the radial site. The radial site is bleeding, and the bleeding does not stop after 15 minutes of holding steady pressure on the site. Your arm or hand becomes pale, cool, tingly, or numb. This information is not intended to replace advice given to you by your health care provider. Make sure you discuss any questions you have with your health care provider. Document Released: 11/05/2010 Document Revised: 03/10/2016 Document Reviewed: 04/21/2014 Elsevier Interactive Patient Education  2018 Elsevier Inc.Keep right wrist elevated on pillow above the heart for today.  Watch right wrist for evidence of bleeding or hematoma.. If bleeding or hematoma noted, hold pressure over the site for at least 15 minutes and notify the physician.  No blending or flexing of the wrist--no lifting for the remainder of the day or for 2 weeks after your procedure. Notify the physician for evidence of infection or if you get a temperature.   Right Heart Cath, Care After This sheet gives you  information about how to care for yourself after your procedure. Your health care provider may also give you more specific instructions. If you have problems or questions, contact your health care provider. What can I expect after the procedure? After the procedure, it is common to have: Bruising or mild discomfort in the area where the IV was inserted (insertion site). Follow these instructions at home: Eating and drinking  You may eat and drink after your procedure.  Drink a lot of fluids for the first several days after the procedure, as directed by your health care provider. This helps to wash (flush) the contrast out of your body. Examples of healthy fluids include water or low-calorie drinks. General instructions Check your IV insertion area and also your venous access site every day for signs of infection. Check for: Redness, swelling, or pain. Fluid or blood. Warmth. Pus or a bad smell. Take over-the-counter and prescription medicines only as told by your health care provider. Rest and return to your normal activities as told by your health care provider. Ask your health care provider what activities are safe for you. Do not drive for 24 hours if you were given a medicine to help you relax (sedative), or until your health care provider approves. Keep all follow-up visits as told by your health care provider. This is important. Contact a health care provider if: Your skin becomes itchy or you develop a rash or hives. You have a fever that does not get better with medicine. You feel nauseous. You vomit. You have redness, swelling, or pain around the insertion site. You have fluid or blood coming from the insertion site. Your insertion area feels warm to the touch. You have pus or a bad smell coming from the insertion site. Get help right away if: You have difficulty breathing or shortness of breath. You develop chest pain. You faint. You feel very dizzy. These symptoms may  represent a serious problem that is an emergency. Do not wait to see if the symptoms will go away. Get medical help right away. Call your local emergency services (911 in the [place] Do not drive yourself to the hospital. Summary After your procedure, it is common to have bruising or mild discomfort in the area where the IV was inserted. You should check your IV insertion area every day for signs of infection. Take over-the-counter and prescription medicines only as told by your health care provider. You should drink a lot of fluids for the first several days after the procedure to help flush the contrast from your body. This information is not intended to replace advice given to you by your health care provider. Make sure you discuss any questions you have with your health care provider. Document Released: 07/24/2013 Document Revised: 09/15/2017 Document Reviewed: 08/27/2016 Elsevier Patient Education  2020 Elsevier Inc.You have trouble breathing. This information is not intended to replace advice given to you by your health care provider. Make sure you discuss any questions you have with your health care provider. Document Released: 10/03/2005 Document Revised: 02/10/2016 Document Reviewed: 12/24/2014  Chartered certified accountant Patient Education  [employer].

## 2022-12-23 NOTE — Interval H&P Note (Signed)
History and Physical Interval Note:  12/23/2022 7:22 AM  Nalea A Guile  has presented today for surgery, with the diagnosis of chest pain, shortness of breath, and pulmonary hypertension.  The various methods of treatment have been discussed with the patient and family. After consideration of risks, benefits and other options for treatment, the patient has consented to  Procedure(s): RIGHT/LEFT HEART CATH AND CORONARY ANGIOGRAPHY (Bilateral) as a surgical intervention.  The patient's history has been reviewed, patient examined, no change in status, stable for surgery.  I have reviewed the patient's chart and labs.  Questions were answered to the patient's satisfaction.    Cath Lab Visit (complete for each Cath Lab visit)  Clinical Evaluation Leading to the Procedure:   ACS: No.  Non-ACS:    Anginal/Heart Failure Classification: CCS III  Anti-ischemic medical therapy: No Therapy  Non-Invasive Test Results: No non-invasive testing performed  Prior CABG: No previous CABG  Barney Gertsch

## 2022-12-26 ENCOUNTER — Encounter: Payer: Self-pay | Admitting: Internal Medicine

## 2022-12-26 ENCOUNTER — Encounter: Payer: BC Managed Care – PPO | Admitting: *Deleted

## 2023-01-04 ENCOUNTER — Encounter: Payer: Self-pay | Admitting: *Deleted

## 2023-01-04 DIAGNOSIS — D860 Sarcoidosis of lung: Secondary | ICD-10-CM

## 2023-01-04 NOTE — Progress Notes (Signed)
Pulmonary Individual Treatment Plan  Patient Details  Name: TITIA ODAM MRN: [id number] Date of Birth: 03-19-1956 Referring Provider:   Flowsheet Row Pulmonary Rehab from 07/25/2022 in [facility] Cardiac and Pulmonary Rehab  Referring Provider Ottie Glazier, MD       Initial Encounter Date:  Flowsheet Row Pulmonary Rehab from 07/25/2022 in [facility] Cardiac and Pulmonary Rehab  Date 07/25/22       Visit Diagnosis: Sarcoidosis of lung ([place])  Patient's Home Medications on Admission:  Current Outpatient Medications:    acetaminophen (TYLENOL) 500 MG tablet, Take 500 mg by mouth every 6 (six) hours as needed for moderate pain or headache., Disp: , Rfl:    albuterol (ACCUNEB) 1.25 MG/3ML nebulizer solution, Take 1 ampule by nebulization as needed., Disp: , Rfl:    azithromycin (ZITHROMAX) 250 MG tablet, Take 250 mg by mouth 3 (three) times a week., Disp: , Rfl:    budesonide (PULMICORT) 0.25 MG/2ML nebulizer solution, Take 0.25 mg by nebulization daily as needed., Disp: , Rfl:    denosumab (PROLIA) 60 MG/ML SOSY injection, Inject 60 mg into the skin every 6 (six) months., Disp: , Rfl:    dorzolamide (TRUSOPT) 2 % ophthalmic solution, Place 1 drop into both eyes 2 (two) times daily., Disp: , Rfl:    escitalopram (LEXAPRO) 20 MG tablet, Take 1 tablet (20 mg total) by mouth daily., Disp: 90 tablet, Rfl: 3   Fluticasone-Umeclidin-Vilant (TRELEGY ELLIPTA) 200-62.5-25 MCG/ACT AEPB, Inhale into the lungs daily., Disp: , Rfl:    latanoprost (XALATAN) 0.005 % ophthalmic solution, Place 1 drop into the right eye 2 (two) times daily., Disp: , Rfl:    levothyroxine (SYNTHROID) 137 MCG tablet, Take 1 tablet (137 mcg total) by mouth daily before breakfast., Disp: 90 tablet, Rfl: 3   Multiple Vitamins-Minerals (MULTIVITAMIN WITH MINERALS) tablet, Take 1 tablet by mouth daily., Disp: , Rfl:    naproxen sodium (ALEVE) 220 MG tablet, Take 220 mg by mouth daily as needed (pain)., Disp: , Rfl:    NON FORMULARY,  CPAP nightly, Disp: , Rfl:    OXYGEN, Inhale 2 L into the lungs daily as needed (During walking and activity). , Disp: , Rfl:    predniSONE (DELTASONE) 10 MG tablet, Take 2 tablets (20 mg total) by mouth daily. Resume only after the finishing new Rx for prednisone (Patient taking differently: Take 15 mg by mouth daily.), Disp: , Rfl:    Probiotic Product (PROBIOTIC PO), Take 1 capsule by mouth daily., Disp: , Rfl:    Treprostinil (TYVASO) 0.6 MG/ML SOLN, Inhale into the lungs See admin instructions. 12 breaths 4 times a day, Disp: , Rfl:   Past Medical History: Past Medical History:  Diagnosis Date   Acute renal failure ([place])    Acute respiratory failure ([place]) 10/23/2021   Acute respiratory failure with hypoxia (HCC)    ARF (acute respiratory failure) ([place]) 10/19/2021   Arrhythmia    patient unaware if this is current   Asthma    Chronic kidney disease    Critical lower limb ischemia (HCC)    Depression    GERD (gastroesophageal reflux disease)    Heart murmur    History of kidney stones    HOH (hard of hearing)    wear aids   Hyperthyroidism    Hypothyroidism    IBS (irritable bowel syndrome)    Pneumonia    Pulmonary hypertension ([place])    Sarcoid    Sarcoidosis    Seasonal allergies    Sleep apnea CPAP  with O2   Stroke ([place]) 07/2020   watershed   Wears hearing aid in both ears     Tobacco Use: Social History   Tobacco Use  Smoking Status Never  Smokeless Tobacco Never    Labs: Review Flowsheet  More data exists      Latest Ref Rng & Units 07/21/2020 01/14/2021 10/19/2021 06/24/2022 12/23/2022  Labs for ITP Cardiac and Pulmonary Rehab  Cholestrol 0 - 200 mg/dL 289  - - - -  LDL (calc) 0 - 99 mg/dL 198  - - - -  HDL-C >40 mg/dL 33  - - - -  Trlycerides <150 mg/dL 290  - - - -  Hemoglobin A1c 4.8 - 5.6 % - - - 6.1  -  PH, Arterial 7.35 - 7.45 - - - - 7.413   PCO2 arterial 32 - 48 mmHg - - - - 44.3   Bicarbonate 20.0 - 28.0 mmol/L - - 31.7  - 28.3  30.0   TCO2 22 - 32  mmol/L - 30  - - 30  32   O2 Saturation % - - 33.6  - 95  65      Pulmonary Assessment Scores:  Pulmonary Assessment Scores     Row Name 07/25/22 1331 08/15/22 1643       ADL UCSD   ADL Phase -- Entry    SOB Score total -- 88    Rest -- 0    Walk -- 3    Stairs -- 5    Bath -- 5    Dress -- 5    Shop -- 5      CAT Score   CAT Score -- 21      mMRC Score   mMRC Score 4 --             UCSD: Self-administered rating of dyspnea associated with activities of daily living (ADLs) 6-point scale (0 = "not at all" to 5 = "maximal or unable to do because of breathlessness")  Scoring Scores range from 0 to 120.  Minimally important difference is 5 units  CAT: CAT can identify the health impairment of COPD patients and is better correlated with disease progression.  CAT has a scoring range of zero to 40. The CAT score is classified into four groups of low (less than 10), medium (10 - 20), high (21-30) and very high (31-40) based on the impact level of disease on health status. A CAT score over 10 suggests significant symptoms.  A worsening CAT score could be explained by an exacerbation, poor medication adherence, poor inhaler technique, or progression of COPD or comorbid conditions.  CAT MCID is 2 points  mMRC: mMRC (Modified Medical Research Council) Dyspnea Scale is used to assess the degree of baseline functional disability in patients of respiratory disease due to dyspnea. No minimal important difference is established. A decrease in score of 1 point or greater is considered a positive change.   Pulmonary Function Assessment:   Exercise Target Goals: Exercise Program Goal: Individual exercise prescription set using results from initial 6 min walk test and THRR while considering  patient's activity barriers and safety.   Exercise Prescription Goal: Initial exercise prescription builds to 30-45 minutes a day of aerobic activity, 2-3 days per week.  Home exercise guidelines  will be given to patient during program as part of exercise prescription that the participant will acknowledge.  Education: Aerobic Exercise: - Group verbal and visual presentation on the components of exercise prescription.  Introduces F.I.T.T principle from ACSM for exercise prescriptions.  Reviews F.I.T.T. principles of aerobic exercise including progression. Written material given at graduation. Flowsheet Row Pulmonary Rehab from 08/10/2018 in [facility] Cardiac and Pulmonary Rehab  Date 05/02/18  Educator [facility]  Instruction Review Code 1- Verbalizes Understanding       Education: Resistance Exercise: - Group verbal and visual presentation on the components of exercise prescription. Introduces F.I.T.T principle from ACSM for exercise prescriptions  Reviews F.I.T.T. principles of resistance exercise including progression. Written material given at graduation.    Education: Exercise & Equipment Safety: - Individual verbal instruction and demonstration of equipment use and safety with use of the equipment. Flowsheet Row Pulmonary Rehab from 08/10/2022 in [facility] Cardiac and Pulmonary Rehab  Date 07/25/22  Educator NT  Instruction Review Code 1- Verbalizes Understanding       Education: Exercise Physiology & General Exercise Guidelines: - Group verbal and written instruction with models to review the exercise physiology of the cardiovascular system and associated critical values. Provides general exercise guidelines with specific guidelines to those with heart or lung disease.  Flowsheet Row Pulmonary Rehab from 08/10/2018 in [facility] Cardiac and Pulmonary Rehab  Date 07/11/18  Educator [employer] [facility]  Instruction Review Code 1- Verbalizes Understanding       Education: Flexibility, Balance, Mind/Body Relaxation: - Group verbal and visual presentation with interactive activity on the components of exercise prescription. Introduces F.I.T.T principle from ACSM for exercise prescriptions. Reviews F.I.T.T.  principles of flexibility and balance exercise training including progression. Also discusses the mind body connection.  Reviews various relaxation techniques to help reduce and manage stress (i.e. Deep breathing, progressive muscle relaxation, and visualization). Balance handout provided to take home. Written material given at graduation. Flowsheet Row Pulmonary Rehab from 08/10/2018 in [facility] Cardiac and Pulmonary Rehab  Date 08/08/18  Educator AS  Instruction Review Code 1- Verbalizes Understanding       Activity Barriers & Risk Stratification:  Activity Barriers & Cardiac Risk Stratification - 07/25/22 1310       Activity Barriers & Cardiac Risk Stratification   Activity Barriers Other (comment);Shortness of Breath    Comments stroke 2021- left foot 3 toes amputated; broke elbow after tripping over a cord ("healed")             6 Minute Walk:  6 Minute Walk     Row Name 07/25/22 1306         6 Minute Walk   Phase Initial     Distance 705 feet     Walk Time 5.83 minutes     # of Rest Breaks 1     MPH 1.37     METS 2.25     RPE 15     Perceived Dyspnea  4     VO2 Peak 7.88     Symptoms Yes (comment)     Comments SOB     Resting HR 116 bpm     Resting BP 124/66     Resting Oxygen Saturation  91 %     Exercise Oxygen Saturation  during 6 min walk 84 %     Max Ex. HR 133 bpm     Max Ex. BP 140/80     2 Minute Post BP 128/72       Interval HR   1 Minute HR 116     2 Minute HR 127     3 Minute HR 129     4 Minute HR 127     5  Minute HR 133     6 Minute HR 128     2 Minute Post HR 119     Interval Heart Rate? Yes       Interval Oxygen   Interval Oxygen? Yes     Baseline Oxygen Saturation % 91 %     1 Minute Oxygen Saturation % 91 %     1 Minute Liters of Oxygen 5 L  Pulsed     2 Minute Oxygen Saturation % 90 %     2 Minute Liters of Oxygen 5 L  Pulsed     3 Minute Oxygen Saturation % 84 %     3 Minute Liters of Oxygen 5 L  Pulsed     4 Minute Oxygen  Saturation % 84 %     4 Minute Liters of Oxygen 5 L  Pulsed     5 Minute Oxygen Saturation % 86 %     5 Minute Liters of Oxygen 5 L  Pulsed     6 Minute Oxygen Saturation % 85 %     6 Minute Liters of Oxygen 5 L  Pulsed     2 Minute Post Oxygen Saturation % 90 %     2 Minute Post Liters of Oxygen 5 L  Pulsed             Oxygen Initial Assessment:  Oxygen Initial Assessment - 07/12/22 1334       Home Oxygen   Home Oxygen Device Portable Concentrator;Home Concentrator    Sleep Oxygen Prescription CPAP    Liters per minute 2    Home Exercise Oxygen Prescription Continuous    Liters per minute 2    Home Resting Oxygen Prescription Continuous   Doctor told her she could use prn during rest   Liters per minute 2    Compliance with Home Oxygen Use Yes      Intervention   Short Term Goals To learn and exhibit compliance with exercise, home and travel O2 prescription;To learn and understand importance of monitoring SPO2 with pulse oximeter and demonstrate accurate use of the pulse oximeter.;To learn and understand importance of maintaining oxygen saturations>88%;To learn and demonstrate proper pursed lip breathing techniques or other breathing techniques. ;To learn and demonstrate proper use of respiratory medications    Long  Term Goals Exhibits compliance with exercise, home  and travel O2 prescription;Verbalizes importance of monitoring SPO2 with pulse oximeter and return demonstration;Maintenance of O2 saturations>88%;Exhibits proper breathing techniques, such as pursed lip breathing or other method taught during program session;Compliance with respiratory medication;Demonstrates proper use of MDI's             Oxygen Re-Evaluation:  Oxygen Re-Evaluation     Row Name 07/25/22 1015 08/29/22 1131 09/19/22 1141         Program Oxygen Prescription   Program Oxygen Prescription -- Continuous Continuous     Liters per minute -- 2 2       Home Oxygen   Home Oxygen Device --  Portable Concentrator;Home Concentrator Portable Concentrator;Home Concentrator     Sleep Oxygen Prescription -- CPAP CPAP     Liters per minute -- 2 2     Home Exercise Oxygen Prescription -- Continuous Continuous     Liters per minute -- 2 2     Home Resting Oxygen Prescription -- Continuous Continuous     Liters per minute -- 2 2     Compliance with Home Oxygen Use -- Yes Yes  Goals/Expected Outcomes   Short Term Goals -- To learn and exhibit compliance with exercise, home and travel O2 prescription;To learn and understand importance of monitoring SPO2 with pulse oximeter and demonstrate accurate use of the pulse oximeter.;To learn and understand importance of maintaining oxygen saturations>88%;To learn and demonstrate proper pursed lip breathing techniques or other breathing techniques. ;To learn and demonstrate proper use of respiratory medications To learn and exhibit compliance with exercise, home and travel O2 prescription;To learn and understand importance of monitoring SPO2 with pulse oximeter and demonstrate accurate use of the pulse oximeter.;To learn and understand importance of maintaining oxygen saturations>88%;To learn and demonstrate proper pursed lip breathing techniques or other breathing techniques. ;To learn and demonstrate proper use of respiratory medications     Long  Term Goals -- Exhibits compliance with exercise, home  and travel O2 prescription;Verbalizes importance of monitoring SPO2 with pulse oximeter and return demonstration;Maintenance of O2 saturations>88%;Exhibits proper breathing techniques, such as pursed lip breathing or other method taught during program session;Compliance with respiratory medication;Demonstrates proper use of MDI's Exhibits compliance with exercise, home  and travel O2 prescription;Verbalizes importance of monitoring SPO2 with pulse oximeter and return demonstration;Maintenance of O2 saturations>88%;Exhibits proper breathing techniques, such as  pursed lip breathing or other method taught during program session;Compliance with respiratory medication;Demonstrates proper use of MDI's     Comments Reviewed PLB technique with pt.  Talked about how it works and it's importance in maintaining their exercise saturations. Reviewed PLB technique with pt.  Talked about how it works and it's importance in maintaining their exercise saturations. Tye [place] has a pulse oximeter at home and has been checking her O2 saturations. When she walks at home she reports that she does experience desaturation but she pauses to let her oxygen come back to normal levels. Tye [place] is doing well in rehab.  She is doing well on her oxgygen at home and saturations are staying up.  She is doing well with her CPAP but has started a new mask type that is not fitting as well.  She may go back to other form.  She is feeling good with her breathing and using her PLB consistently.     Goals/Expected Outcomes Short: Become more profiecient at using PLB.   Long: Become independent at using PLB. Short: Become more profiecient at using PLB.   Long: Become independent at using PLB. Short: Check on CPAP mask again Long: Conitnue to use PLB              Oxygen Discharge (Final Oxygen Re-Evaluation):  Oxygen Re-Evaluation - 09/19/22 1141       Program Oxygen Prescription   Program Oxygen Prescription Continuous    Liters per minute 2      Home Oxygen   Home Oxygen Device Portable Concentrator;Home Concentrator    Sleep Oxygen Prescription CPAP    Liters per minute 2    Home Exercise Oxygen Prescription Continuous    Liters per minute 2    Home Resting Oxygen Prescription Continuous    Liters per minute 2    Compliance with Home Oxygen Use Yes      Goals/Expected Outcomes   Short Term Goals To learn and exhibit compliance with exercise, home and travel O2 prescription;To learn and understand importance of monitoring SPO2 with pulse oximeter and demonstrate accurate use of the pulse  oximeter.;To learn and understand importance of maintaining oxygen saturations>88%;To learn and demonstrate proper pursed lip breathing techniques or other breathing techniques. ;To learn and demonstrate proper use  of respiratory medications    Long  Term Goals Exhibits compliance with exercise, home  and travel O2 prescription;Verbalizes importance of monitoring SPO2 with pulse oximeter and return demonstration;Maintenance of O2 saturations>88%;Exhibits proper breathing techniques, such as pursed lip breathing or other method taught during program session;Compliance with respiratory medication;Demonstrates proper use of MDI's    Comments Tye [place] is doing well in rehab.  She is doing well on her oxgygen at home and saturations are staying up.  She is doing well with her CPAP but has started a new mask type that is not fitting as well.  She may go back to other form.  She is feeling good with her breathing and using her PLB consistently.    Goals/Expected Outcomes Short: Check on CPAP mask again Long: Conitnue to use PLB             Initial Exercise Prescription:  Initial Exercise Prescription - 07/27/22 1100       Intensity   THRR 40-80% of Max Heartrate 118 - 141   New   Ratings of Perceived Exertion 11-13    Perceived Dyspnea 0-4             Perform Capillary Blood Glucose checks as needed.  Exercise Prescription Changes:   Exercise Prescription Changes     Row Name 07/25/22 1300 07/27/22 1100 08/09/22 1400 08/22/22 1100 09/20/22 1500     Response to Exercise   Blood Pressure (Admit) 124/66 -- 120/62 130/70 132/74   Blood Pressure (Exercise) 140/80 -- 138/76 138/62 128/72   Blood Pressure (Exit) 128/72 -- 140/82 134/72 122/64   Heart Rate (Admit) 116 bpm -- 97 bpm 96 bpm 103 bpm   Heart Rate (Exercise) 133 bpm -- 127 bpm 119 bpm 121 bpm   Heart Rate (Exit) 119 bpm -- 109 bpm 104 bpm 105 bpm   Oxygen Saturation (Admit) 91 % -- 96 % 95 % 97 %   Oxygen Saturation (Exercise) 84  % -- 89 % 91 % 89 %   Oxygen Saturation (Exit) 90 % -- 96 % 95 % 95 %   Rating of Perceived Exertion (Exercise) 14 -- 13 13 13    Perceived Dyspnea (Exercise) 4 -- 2 4 3    Symptoms SOB -- SOB SOB SOB   Comments 6MWT Results -- First 3 sessions in rehab -- --   Duration -- -- Continue with 30 min of aerobic exercise without signs/symptoms of physical distress. Continue with 30 min of aerobic exercise without signs/symptoms of physical distress. Continue with 30 min of aerobic exercise without signs/symptoms of physical distress.   Intensity -- THRR New  Changed 118-141 THRR unchanged THRR unchanged THRR unchanged     Progression   Progression -- -- Continue to progress workloads to maintain intensity without signs/symptoms of physical distress. Continue to progress workloads to maintain intensity without signs/symptoms of physical distress. Continue to progress workloads to maintain intensity without signs/symptoms of physical distress.   Average METs -- -- 1.9 2.07 2.03     Resistance Training   Training Prescription -- -- Yes Yes Yes   Weight -- -- 3 lb 3 lb 3 lb   Reps -- -- 10-15 10-15 10-15     Interval Training   Interval Training -- -- No No No     Oxygen   Oxygen -- -- Continuous Continuous Continuous   Liters -- -- 3 3 3-4     Treadmill   MPH -- 1.5 1.1 1.2 1   Grade --  0 0 0 0   Minutes -- 15 15 15 15    METs -- 2.15 1.84 1.92 1.8     Recumbant Bike   Level -- -- -- -- 1.2   Watts -- -- -- -- 15   Minutes -- -- -- -- 15   METs -- -- -- -- 2.54     NuStep   Level -- -- -- 1 4   Minutes -- -- -- 15 15   METs -- -- -- 2 1.7     REL-XR   Level -- -- 1 1 1    Minutes -- -- 15 15 15    METs -- -- 2.5 2.9 2.4     T5 Nustep   Level -- -- 1 1 --   Minutes -- -- 15 15 --   METs -- -- 1.6 2 --     Oxygen   Maintain Oxygen Saturation -- -- 88% or higher 88% or higher 88% or higher    Row Name 10/04/22 1500 11/01/22 0800           Response to Exercise   Blood  Pressure (Admit) 140/70 140/68      Blood Pressure (Exit) 124/70 132/62      Heart Rate (Admit) 107 bpm 104 bpm      Heart Rate (Exercise) 119 bpm 110 bpm      Heart Rate (Exit) 116 bpm 101 bpm      Oxygen Saturation (Admit) 94 % 90 %      Oxygen Saturation (Exercise) 89 % 93 %      Oxygen Saturation (Exit) 95 % 93 %      Rating of Perceived Exertion (Exercise) 12 14      Perceived Dyspnea (Exercise) 3 2      Symptoms SOB SOB      Duration Continue with 30 min of aerobic exercise without signs/symptoms of physical distress. Continue with 30 min of aerobic exercise without signs/symptoms of physical distress.      Intensity THRR unchanged THRR unchanged        Progression   Progression Continue to progress workloads to maintain intensity without signs/symptoms of physical distress. Continue to progress workloads to maintain intensity without signs/symptoms of physical distress.      Average METs 2.28 1.8        Resistance Training   Training Prescription Yes Yes      Weight 3 lb 3 lb      Reps 10-15 10-15        Interval Training   Interval Training No No        Oxygen   Oxygen Continuous Continuous      Liters 4 4        Treadmill   MPH 1.2 --      Grade 0 --      Minutes 15 --      METs 1.92 --        NuStep   Level 3 1      Minutes 15 15      METs 2 1.5        REL-XR   Level 1 1      Minutes 15 15      METs 3.2 2.1        Biostep-RELP   Level 1 --      Minutes 15 --      METs 2 --        Oxygen   Maintain Oxygen Saturation 88%  or higher 88% or higher               Exercise Comments:   Exercise Comments     Row Name 07/25/22 1012           Exercise Comments First full day of exercise!  Patient was oriented to gym and equipment including functions, settings, policies, and procedures.  Patient's individual exercise prescription and treatment plan were reviewed.  All starting workloads were established based on the results of the 6 minute walk test  done at initial orientation visit.  The plan for exercise progression was also introduced and progression will be customized based on patient's performance and goals.                Exercise Goals and Review:   Exercise Goals     Row Name 07/25/22 1330             Exercise Goals   Increase Physical Activity Yes       Intervention Provide advice, education, support and counseling about physical activity/exercise needs.;Develop an individualized exercise prescription for aerobic and resistive training based on initial evaluation findings, risk stratification, comorbidities and participant's personal goals.       Expected Outcomes Short Term: Attend rehab on a regular basis to increase amount of physical activity.;Long Term: Add in home exercise to make exercise part of routine and to increase amount of physical activity.;Long Term: Exercising regularly at least 3-5 days a week.       Increase Strength and Stamina Yes       Intervention Provide advice, education, support and counseling about physical activity/exercise needs.;Develop an individualized exercise prescription for aerobic and resistive training based on initial evaluation findings, risk stratification, comorbidities and participant's personal goals.       Expected Outcomes Short Term: Increase workloads from initial exercise prescription for resistance, speed, and METs.;Short Term: Perform resistance training exercises routinely during rehab and add in resistance training at home;Long Term: Improve cardiorespiratory fitness, muscular endurance and strength as measured by increased METs and functional capacity (6MWT)       Able to understand and use rate of perceived exertion (RPE) scale Yes       Intervention Provide education and explanation on how to use RPE scale       Expected Outcomes Short Term: Able to use RPE daily in rehab to express subjective intensity level;Long Term:  Able to use RPE to guide intensity level when  exercising independently       Able to understand and use Dyspnea scale Yes       Intervention Provide education and explanation on how to use Dyspnea scale       Expected Outcomes Short Term: Able to use Dyspnea scale daily in rehab to express subjective sense of shortness of breath during exertion;Long Term: Able to use Dyspnea scale to guide intensity level when exercising independently       Knowledge and understanding of Target Heart Rate Range (THRR) Yes       Intervention Provide education and explanation of THRR including how the numbers were predicted and where they are located for reference       Expected Outcomes Short Term: Able to state/look up THRR;Short Term: Able to use daily as guideline for intensity in rehab;Long Term: Able to use THRR to govern intensity when exercising independently       Able to check pulse independently Yes       Intervention Provide education and  demonstration on how to check pulse in carotid and radial arteries.;Review the importance of being able to check your own pulse for safety during independent exercise       Expected Outcomes Short Term: Able to explain why pulse checking is important during independent exercise;Long Term: Able to check pulse independently and accurately       Understanding of Exercise Prescription Yes       Intervention Provide education, explanation, and written materials on patient's individual exercise prescription       Expected Outcomes Short Term: Able to explain program exercise prescription;Long Term: Able to explain home exercise prescription to exercise independently                Exercise Goals Re-Evaluation :  Exercise Goals Re-Evaluation     [place] Name 07/25/22 1012 08/08/22 1108 08/09/22 1419 08/22/22 1200 08/29/22 1115     Exercise Goal Re-Evaluation   Exercise Goals Review Able to understand and use rate of perceived exertion (RPE) scale;Able to understand and use Dyspnea scale;Knowledge and understanding of  Target Heart Rate Range (THRR);Understanding of Exercise Prescription Understanding of Exercise Prescription;Increase Physical Activity;Increase Strength and Stamina Understanding of Exercise Prescription;Increase Physical Activity;Increase Strength and Stamina Understanding of Exercise Prescription;Increase Physical Activity;Increase Strength and Stamina Understanding of Exercise Prescription;Increase Physical Activity;Increase Strength and Stamina   Comments Reviewed RPE scale, THR and program prescription with pt today.  Pt voiced understanding and was given a copy of goals to take home. Lynden Ang feels that she is doing well with exercise. She states that she feels stronger since starting the program. She stated that she has not seen a huge benefit in her breathing but she wants to continue to work towards that goal. She does also state that she felt over exerted last week, but states it could have been due to being ill. We will continue to monitor her progress in the program. Lynden Ang is doing well in rehab. Her average overall MET level after her first 3 sessions was 1.9 METs. She also has done well walking on the treadmill at 1.1 mph without desaturating. She has tolerated 3 lb hand weights for resistance training as well. We will continue to monitor her progress in the program. Lynden Ang continues to do well in rehab. She was able to get her speed on the treadmill to 1.2 mph, though walking is challenging for her. She has not increased her level on other seated machines but all are reflecting appropriate RPEs. She should work toward increasing to level 2 on the T4 Nustep. Oxygen saturations are staying above 88%. Will continue to monitor. Lynden Ang is doing well in rehab. She states that she has more energy since starting the program. She has been walking on the treadmill at a speed of 1.2 mph, but has had some trouble with her oxygen saturation dropping below 88. Lynden Ang has been doing some walking at home  about 2-3 times  a week as well, and feels she has seen some improvement there as well. We will continue to monitor her progress in the program.   Expected Outcomes Short: Use RPE daily to regulate intensity.  Long: Follow program prescription in THR. Short: Continue to follow current exercise prescription. Long: Continue to increase strength and stamina. Short: Continue to increase workloads as tolerated. Long: Continue to increase strength and stamina. Short: Increase to level 2 on T4 Nustep. Long: Continue to increase overall MET level Short: Continue to walk at home on days away from rehab. Long: Continue to increase  strength and stamina.    [place] Name 09/19/22 1127 09/20/22 1503 10/04/22 1505 10/19/22 1124 11/01/22 0852     Exercise Goal Re-Evaluation   Exercise Goals Review Understanding of Exercise Prescription;Increase Physical Activity;Increase Strength and Stamina Understanding of Exercise Prescription;Increase Physical Activity;Increase Strength and Stamina Understanding of Exercise Prescription;Increase Physical Activity;Increase Strength and Stamina Understanding of Exercise Prescription;Increase Physical Activity;Increase Strength and Stamina Understanding of Exercise Prescription;Increase Physical Activity;Increase Strength and Stamina   Comments Tye Maryland is doing well in rehab.  She feels good with her exercise in class.  She is walking on deck at home on her off days.  She is also using her pedal machine at home.  She has been doing her stairs more at home.  She is feeling stronger overall and feeling better. Tye Maryland has been having intermittent attendance due to being sick. Once returned, she was able to increase to level 4 on the T4 Nustep. She would benefit from increasing her level on the XR. Perhaps we can also add a small incline to her TM as her speed has been consistent. Oxygen saturations are staying above 88%. Will continue to monitor. Tye Maryland is doing well in the program. She recently increased her overall  average MET level of 2.28 METs. She also was able to increase her speed back up to 1.2 mph on the treadmill. She also has kept her oxygen saturations above 88% since she began using 3L of O2 for exercise. We will continue to monitor her progress in the program. Tye Maryland has not attended since last review. She has been out sick and staff will continue to follow up with patient on when she is feeling better and able to come back. Tye Maryland has only attended rehab once since the last review. She returned from being sick and her workloads on both the T4 and XR were both down to level 1. She also was not able to do any walking on the treadmill during her first session back in rehab. We will continue to monitor her progress as she returns to consistent attendance.   Expected Outcomes Short: Conitnue to add in more walking at home Long: conitnue to improve stamina. Short: Increase level on XR Long: Continue to increase overall MET level Short: Increase level on XR. Long: Continue to improve strength and stamina. Short: Return to rehab and maintain good attendance Long: Graduate from [employer] Short: Increase workloads back up to the levels prior to sickness. Long: Continue to improve strength and stamina.    [place] Name 11/15/22 1238 12/12/22 1547 12/27/22 1501         Exercise Goal Re-Evaluation   Exercise Goals Review -- -- Understanding of Exercise Prescription;Increase Physical Activity;Increase Strength and Stamina     Comments Out since last review Tye Maryland has not been here since last review. She has a scheduled heart cath for 3/8 and we will wait to hear back from her doctor on results on next steps. Patient is currently on a medical hold. Tye Maryland has not been here since last review. She had a heart cath on 3/8. We attempted to call patient to follow up but had to leave a message. We will wait to hear back from her doctor on results and next steps. Patient is currently on a medical hold.     Expected Outcomes --  Short: Wait to hear back on test results, get clearance to return Long: Graduate from [place]: Wait to hear back on test results, get clearance to return. Long: Graduate from [employer].  Discharge Exercise Prescription (Final Exercise Prescription Changes):  Exercise Prescription Changes - 11/01/22 0800       Response to Exercise   Blood Pressure (Admit) 140/68    Blood Pressure (Exit) 132/62    Heart Rate (Admit) 104 bpm    Heart Rate (Exercise) 110 bpm    Heart Rate (Exit) 101 bpm    Oxygen Saturation (Admit) 90 %    Oxygen Saturation (Exercise) 93 %    Oxygen Saturation (Exit) 93 %    Rating of Perceived Exertion (Exercise) 14    Perceived Dyspnea (Exercise) 2    Symptoms SOB    Duration Continue with 30 min of aerobic exercise without signs/symptoms of physical distress.    Intensity THRR unchanged      Progression   Progression Continue to progress workloads to maintain intensity without signs/symptoms of physical distress.    Average METs 1.8      Resistance Training   Training Prescription Yes    Weight 3 lb    Reps 10-15      Interval Training   Interval Training No      Oxygen   Oxygen Continuous    Liters 4      NuStep   Level 1    Minutes 15    METs 1.5      REL-XR   Level 1    Minutes 15    METs 2.1      Oxygen   Maintain Oxygen Saturation 88% or higher             Nutrition:  Target Goals: Understanding of nutrition guidelines, daily intake of sodium 1500mg , cholesterol 200mg , calories 30% from fat and 7% or less from saturated fats, daily to have 5 or more servings of fruits and vegetables.  Education: All About Nutrition: -Group instruction provided by verbal, written material, interactive activities, discussions, models, and posters to present general guidelines for heart healthy nutrition including fat, fiber, MyPlate, the role of sodium in heart healthy nutrition, utilization of the nutrition label, and  utilization of this knowledge for meal planning. Follow up email sent as well. Written material given at graduation. Flowsheet Row Pulmonary Rehab from 08/10/2018 in [facility] Cardiac and Pulmonary Rehab  Date 06/27/18  Educator LB  Instruction Review Code 1- Verbalizes Understanding       Biometrics:  Pre Biometrics - 07/25/22 1330       Pre Biometrics   Height 5\' 6"  (1.676 m)    Weight 188 lb 9.6 oz (85.5 kg)    Waist Circumference 37 inches    Hip Circumference 44 inches    Waist to Hip Ratio 0.84 %    BMI (Calculated) 30.46              Nutrition Therapy Plan and Nutrition Goals:  Nutrition Therapy & Goals - 07/20/22 1517       Nutrition Therapy   Diet Heart healthy, low Na, pulmonary MNT    Protein (specify units) 90-95g    Fiber 27 grams    Whole Grain Foods 3 servings    Saturated Fats 15 max. grams    Fruits and Vegetables 8 servings/day    Sodium 1.5 grams      Personal Nutrition Goals   Nutrition Goal ST: practice MyPlate guidelines (adding in 1-2 additional servings of non-starchy vegetables), be sure to include at least 1 good source of protein at meals and snacks, limit sodium by reading food labels and using other ways to flavor  food LT: limit Na <1.5g/day, have at least 5 servings of vegetables/day    Comments 67 y.o. F admitted to pulmonary rehab for sarcoidosis of lung. PMHx includes hypothyroidism, HLD, CKD stg 3, asthma, HTN, OSA, elevated BG (A1C 6.4), anxiety. Relevant medications includes lexapro, synthroid, MVI, prednisone, probiotic, tyvaso, trilogy. B: healthy waffles occasionally. She will have eggs with sausage. S: cheese and crackers or yogurt (greek yogurt) L: egg salad or tuna salad with lettuce, rye bread. S: cheese and crackers or yogurt (greek yogurt) D: protein (meat or chicken) with a salad and more starchy vegetables. S: sometimes low calorie ice cream bar. She loves fruit and will have it with most meals. She limits dairy as this can make her  not feel great. She will use avocado oil and coconut aminos mostly, she does also use "no salt" which replaces sodium chloride with potassium choride. Drinks: Coffee (sometimes), water. She reports that her daughter is health conscious and has taken care of her for about 2 years - she is now going back to work. Discussed general heart healthy eating, sodium, and pulmonary MNT. Also reviewed ways to maintain a healthy BG. Advised to stop using the "no salt" and to decrease the salt in her processed foods as well as adding other ways to flavor her food. Since her protein needs are higher, suggested she have at least one good source of protein at each meal.      Intervention Plan   Intervention Prescribe, educate and counsel regarding individualized specific dietary modifications aiming towards targeted core components such as weight, hypertension, lipid management, diabetes, heart failure and other comorbidities.    Expected Outcomes Short Term Goal: Understand basic principles of dietary content, such as calories, fat, sodium, cholesterol and nutrients.;Short Term Goal: A plan has been developed with personal nutrition goals set during dietitian appointment.;Long Term Goal: Adherence to prescribed nutrition plan.             Nutrition Assessments:  MEDIFICTS Score Key: ?70 Need to make dietary changes  40-70 Heart Healthy Diet ? 40 Therapeutic Level Cholesterol Diet  Flowsheet Row Pulmonary Rehab from 08/15/2022 in [facility] Cardiac and Pulmonary Rehab  Picture Your Plate Total Score on Admission 58      Picture Your Plate Scores: [id number] Unhealthy dietary pattern with much room for improvement. 41-50 Dietary pattern unlikely to meet recommendations for good health and room for improvement. 51-60 More healthful dietary pattern, with some room for improvement.  >60 Healthy dietary pattern, although there may be some specific behaviors that could be improved.   Nutrition Goals Re-Evaluation:   Nutrition Goals Re-Evaluation     [place] Name 08/08/22 1113 08/29/22 1124 09/19/22 1134         Goals   Nutrition Goal ST: practice MyPlate guidelines (adding in 1-2 additional servings of non-starchy vegetables), be sure to include at least 1 good source of protein at meals and snacks, limit sodium by reading food labels and using other ways to flavor food LT: limit Na <1.5g/day, have at least 5 servings of vegetables/day -- Short: She will continue to eat fruits and vegetables daily, and to read food labels. Long: Continue to follow healthy eating patterns discussed with RD.     Comment She has been making smoothies regularly and has been adding more vegetables than fruit to them to help increase her intake. Including fruits and vegetables daily. She continues to read food labels regularly and feels that she has been able to maintain new healthier habits --  Tye Maryland is doing well with her diet.  Her weight is going back down now.  She is getting in more protein and getting in eggs and yogurt for breakfast. She is working on reading her food labels and watching sodium and sugar intake.  Her daughhter is helping her keep on top of her diet.     Expected Outcome Short: She will continue to eat fruits and vegetables daily, and to read food labels. Long: Continue to follow healthy eating patterns discussed with RD. Short: She will continue to eat fruits and vegetables daily, and to read food labels. Long: Continue to follow healthy eating patterns discussed with RD. Short: Continue to work on adding in protein Long: Continue to work on healthy eating       Personal Goal #2 Re-Evaluation   Personal Goal #2 -- Continue to educate yourself on foods high in sodium and added sugar. Look for foods with less than ~10g added sugar per serving and less than ~250mg  sodium per serving --              Nutrition Goals Discharge (Final Nutrition Goals Re-Evaluation):  Nutrition Goals Re-Evaluation - 09/19/22 1134        Goals   Nutrition Goal Short: She will continue to eat fruits and vegetables daily, and to read food labels. Long: Continue to follow healthy eating patterns discussed with RD.    Comment Tye Maryland is doing well with her diet.  Her weight is going back down now.  She is getting in more protein and getting in eggs and yogurt for breakfast. She is working on reading her food labels and watching sodium and sugar intake.  Her daughhter is helping her keep on top of her diet.    Expected Outcome Short: Continue to work on adding in protein Long: Continue to work on healthy eating             Psychosocial: Target Goals: Acknowledge presence or absence of significant depression and/or stress, maximize coping skills, provide positive support system. Participant is able to verbalize types and ability to use techniques and skills needed for reducing stress and depression.   Education: Stress, Anxiety, and Depression - Group verbal and visual presentation to define topics covered.  Reviews how body is impacted by stress, anxiety, and depression.  Also discusses healthy ways to reduce stress and to treat/manage anxiety and depression.  Written material given at graduation. Flowsheet Row Pulmonary Rehab from 08/10/2022 in [facility] Cardiac and Pulmonary Rehab  Date 08/10/22  Educator [facility]  Instruction Review Code 1- [employer] Understanding       Education: Sleep Hygiene -Provides group verbal and written instruction about how sleep can affect your health.  Define sleep hygiene, discuss sleep cycles and impact of sleep habits. Review good sleep hygiene tips.  Flowsheet Row Pulmonary Rehab from 08/10/2018 in [facility] Cardiac and Pulmonary Rehab  Date 04/11/18  Educator [facility]  Instruction Review Code 1- Verbalizes Understanding       Initial Review & Psychosocial Screening:  Initial Psych Review & Screening - 07/12/22 1339       Initial Review   Current issues with Current Stress Concerns    Source of  Stress Concerns Chronic Illness      Family Dynamics   Good Support System? Yes   family and friends     Barriers   Psychosocial barriers to participate in program There are no identifiable barriers or psychosocial needs.;The patient should benefit from training in stress management and  relaxation.      Screening Interventions   Interventions Encouraged to exercise;Provide feedback about the scores to participant;To provide support and resources with identified psychosocial needs    Expected Outcomes Short Term goal: Utilizing psychosocial counselor, staff and physician to assist with identification of specific Stressors or current issues interfering with healing process. Setting desired goal for each stressor or current issue identified.;Long Term Goal: Stressors or current issues are controlled or eliminated.;Short Term goal: Identification and review with participant of any Quality of Life or Depression concerns found by scoring the questionnaire.;Long Term goal: The participant improves quality of Life and PHQ9 Scores as seen by post scores and/or verbalization of changes             Quality of Life Scores:  Scores of 19 and below usually indicate a poorer quality of life in these areas.  A difference of  2-3 points is a clinically meaningful difference.  A difference of 2-3 points in the total score of the Quality of Life Index has been associated with significant improvement in overall quality of life, self-image, physical symptoms, and general health in studies assessing change in quality of life.  PHQ-9: Review Flowsheet  More data exists      07/20/2022 06/24/2022 10/13/2021 09/28/2021 09/21/2020  Depression screen PHQ 2/9  Decreased Interest 0 0 0 0 0  Down, Depressed, Hopeless 0 0 0 0 0  PHQ - 2 Score 0 0 0 0 0  Altered sleeping 1 0 0 0 1  Tired, decreased energy 1 0 0 0 1  Change in appetite 0 0 0 0 0  Feeling bad or failure about yourself  0 0 0 0 0  Trouble concentrating  1 0 0 0 0  Moving slowly or fidgety/restless 0 0 0 0 0  Suicidal thoughts 0 0 0 0 0  PHQ-9 Score 3 0 0 0 2  Difficult doing work/chores Not difficult at all Not difficult at all Not difficult at all Not difficult at all Not difficult at all   Interpretation of Total Score  Total Score Depression Severity:  1-4 = Minimal depression, 5-9 = Mild depression, 10-14 = Moderate depression, 15-19 = Moderately severe depression, 20-27 = Severe depression   Psychosocial Evaluation and Intervention:  Psychosocial Evaluation - 07/12/22 1352       Psychosocial Evaluation & Interventions   Interventions Encouraged to exercise with the program and follow exercise prescription;Relaxation education    Comments Tye Maryland is returning to pulmonary rehab by request. She had completed the program in 2019 and after having a stroke and increased breathing issues, she asked to come back. She wants to be able to play with her 11 and soon to be 12 grandkids, and states she knows her breathing may not get better but she wants to feel better.  Her strength and stamina are also things she wants to work on. She does not report any sleep concerns. She reports that sometimes she may feel down about all the medical things that have happened recently, but she doesn't stay there long because of her amazing support system.    Expected Outcomes SHort: attend pulmonary rehab for educaiton and exercise. Long: develop and maintain positive self care habits.    Continue Psychosocial Services  Follow up required by staff             Psychosocial Re-Evaluation:  Psychosocial Re-Evaluation     [place] Name 07/28/22 1130 08/29/22 1120 09/19/22 1129  Psychosocial Re-Evaluation   Current issues with Current Psychotropic Meds;Current Stress Concerns Current Psychotropic Meds;Current Stress Concerns Current Psychotropic Meds;Current Stress Concerns     Comments Tye Maryland states that she is stressed due to her stroke and hear breathing  issues. He has a positive outlook on improving her health. Her breathing has got worse over the last couple years because she was not able to exercise much. Tye Maryland denies any major stressors at this time. She states that she has a good support system at home made up mostly by her husband and children. For stress relief she likes to read and study her Bible and play with her grandchildren. She also feels that exercise has been a good stress releiver for her as well. Tye Maryland is doing well in rehab.  She is doing more stairs than she used to so she is not relying on her daughter as much now.  She says her older grandkids helped decorate for Christmas but went over the top, but she was grateful for their help.  She does not have any major stressors currently.  She had a call from credit card to watch her account spending, but they were all legit charges.  She is still sleeping well. She is still taking time for herself for Bible study and spending time with her grandchildren.     Expected Outcomes Short: Start [name] to help with mood. Long: Maintain a healthy mental state Short: Continue [name] to help with mood. Long: Maintain a positive outlook. Short; COnitnue to exercise for menatl boost Long: Continue to stay positive     Interventions Relaxation education;Stress management education;Encouraged to attend Pulmonary Rehabilitation for the exercise Relaxation education;Stress management education;Encouraged to attend Pulmonary Rehabilitation for the exercise Relaxation education;Stress management education;Encouraged to attend Pulmonary Rehabilitation for the exercise     Continue Psychosocial Services  Follow up required by staff Follow up required by staff Follow up required by staff              Psychosocial Discharge (Final Psychosocial Re-Evaluation):  Psychosocial Re-Evaluation - 09/19/22 1129       Psychosocial Re-Evaluation   Current issues with Current Psychotropic Meds;Current Stress  Concerns    Comments Tye Maryland is doing well in rehab.  She is doing more stairs than she used to so she is not relying on her daughter as much now.  She says her older grandkids helped decorate for Christmas but went over the top, but she was grateful for their help.  She does not have any major stressors currently.  She had a call from credit card to watch her account spending, but they were all legit charges.  She is still sleeping well. She is still taking time for herself for Bible study and spending time with her grandchildren.    Expected Outcomes Short; COnitnue to exercise for menatl boost Long: Continue to stay positive    Interventions Relaxation education;Stress management education;Encouraged to attend Pulmonary Rehabilitation for the exercise    Continue Psychosocial Services  Follow up required by staff             Education: Education Goals: Education classes will be provided on a weekly basis, covering required topics. Participant will state understanding/return demonstration of topics presented.  Learning Barriers/Preferences:   General Pulmonary Education Topics:  Infection Prevention: - Provides verbal and written material to individual with discussion of infection control including proper hand washing and proper equipment cleaning during exercise session. Flowsheet Row Pulmonary Rehab from 08/10/2022 in [facility] Cardiac  and Pulmonary Rehab  Date 07/27/22  Educator NT  Instruction Review Code 1- Verbalizes Understanding       Falls Prevention: - Provides verbal and written material to individual with discussion of falls prevention and safety. Flowsheet Row Pulmonary Rehab from 08/10/2022 in [facility] Cardiac and Pulmonary Rehab  Date 07/27/22  Educator NT  Instruction Review Code 1- Verbalizes Understanding       Chronic Lung Disease Review: - Group verbal instruction with posters, models, PowerPoint presentations and videos,  to review new updates, new respiratory  medications, new advancements in procedures and treatments. Providing information on websites and "800" numbers for continued self-education. Includes information about supplement oxygen, available portable oxygen systems, continuous and intermittent flow rates, oxygen safety, concentrators, and Medicare reimbursement for oxygen. Explanation of Pulmonary Drugs, including class, frequency, complications, importance of spacers, rinsing mouth after steroid MDI's, and proper cleaning methods for nebulizers. Review of basic lung anatomy and physiology related to function, structure, and complications of lung disease. Review of risk factors. Discussion about methods for diagnosing sleep apnea and types of masks and machines for OSA. Includes a review of the use of types of environmental controls: home humidity, furnaces, filters, dust mite/pet prevention, HEPA vacuums. Discussion about weather changes, air quality and the benefits of nasal washing. Instruction on Warning signs, infection symptoms, calling MD promptly, preventive modes, and value of vaccinations. Review of effective airway clearance, coughing and/or vibration techniques. Emphasizing that all should Create an Action Plan. Written material given at graduation. Flowsheet Row Pulmonary Rehab from 08/10/2018 in [facility] Cardiac and Pulmonary Rehab  Date 06/29/18  Educator [facility]  Instruction Review Code 1- Verbalizes Understanding       AED/CPR: - Group verbal and written instruction with the use of models to demonstrate the basic use of the AED with the basic ABC's of resuscitation.    Anatomy and Cardiac Procedures: - Group verbal and visual presentation and models provide information about basic cardiac anatomy and function. Reviews the testing methods done to diagnose heart disease and the outcomes of the test results. Describes the treatment choices: Medical Management, Angioplasty, or Coronary Bypass Surgery for treating various heart conditions  including Myocardial Infarction, Angina, Valve Disease, and Cardiac Arrhythmias.  Written material given at graduation. Flowsheet Row Pulmonary Rehab from 08/10/2018 in [facility] Cardiac and Pulmonary Rehab  Date 05/16/18  Educator [facility]  Instruction Review Code 5- Refused Teaching       Medication Safety: - Group verbal and visual instruction to review commonly prescribed medications for heart and lung disease. Reviews the medication, class of the drug, and side effects. Includes the steps to properly store meds and maintain the prescription regimen.  Written material given at graduation. Flowsheet Row Pulmonary Rehab from 08/10/2018 in [facility] Cardiac and Pulmonary Rehab  Date 08/10/18  Educator [facility]  Instruction Review Code 1- Verbalizes Understanding       Other: -Provides group and verbal instruction on various topics (see comments)   Knowledge Questionnaire Score:  Knowledge Questionnaire Score - 08/15/22 1642       Knowledge Questionnaire Score   Pre Score 11/18              Core Components/Risk Factors/Patient Goals at Admission:  Personal Goals and Risk Factors at Admission - 07/12/22 1337       Core Components/Risk Factors/Patient Goals on Admission    Weight Management --    Intervention --    Expected Outcomes --    Improve shortness of breath with ADL's Yes  Intervention Provide education, individualized exercise plan and daily activity instruction to help decrease symptoms of SOB with activities of daily living.    Expected Outcomes Short Term: Improve cardiorespiratory fitness to achieve a reduction of symptoms when performing ADLs;Long Term: Be able to perform more ADLs without symptoms or delay the onset of symptoms    Hypertension Yes    Intervention Provide education on lifestyle modifcations including regular physical activity/exercise, weight management, moderate sodium restriction and increased consumption of fresh fruit, vegetables, and low fat dairy,  alcohol moderation, and smoking cessation.;Monitor prescription use compliance.    Expected Outcomes Short Term: Continued assessment and intervention until BP is < 140/31mm HG in hypertensive participants. < 130/34mm HG in hypertensive participants with diabetes, heart failure or chronic kidney disease.;Long Term: Maintenance of blood pressure at goal levels.    Lipids Yes    Intervention Provide education and support for participant on nutrition & aerobic/resistive exercise along with prescribed medications to achieve LDL 70mg , HDL >40mg .    Expected Outcomes Short Term: Participant states understanding of desired cholesterol values and is compliant with medications prescribed. Participant is following exercise prescription and nutrition guidelines.;Long Term: Cholesterol controlled with medications as prescribed, with individualized exercise RX and with personalized nutrition plan. Value goals: LDL < 70mg , HDL > 40 mg.             Education:Diabetes - Individual verbal and written instruction to review signs/symptoms of diabetes, desired ranges of glucose level fasting, after meals and with exercise. Acknowledge that pre and post exercise glucose checks will be done for 3 sessions at entry of program.   Know Your Numbers and Heart Failure: - Group verbal and visual instruction to discuss disease risk factors for cardiac and pulmonary disease and treatment options.  Reviews associated critical values for Overweight/Obesity, Hypertension, Cholesterol, and Diabetes.  Discusses basics of heart failure: signs/symptoms and treatments.  Introduces Heart Failure Zone chart for action plan for heart failure.  Written material given at graduation. Flowsheet Row Pulmonary Rehab from 08/10/2022 in [facility] Cardiac and Pulmonary Rehab  Date 07/27/22  Educator SB  Instruction Review Code 1- Verbalizes Understanding       Core Components/Risk Factors/Patient Goals Review:   Goals and Risk Factor Review      Row Name 07/28/22 1127 08/08/22 1114 08/29/22 1125 09/19/22 1137       Core Components/Risk Factors/Patient Goals Review   Personal Goals Review Improve shortness of breath with ADL's Improve shortness of breath with ADL's Improve shortness of breath with ADL's;Weight Management/Obesity Improve shortness of breath with ADL's;Weight Management/Obesity;Increase knowledge of respiratory medications and ability to use respiratory devices properly.    Review Spoke to patient about their shortness of breath and what they can do to improve. Patient has been informed of breathing techniques when starting the program. Patient is informed to tell staff if they have had any med changes and that certain meds they are taking or not taking can be causing shortness of breath. Spoke to patient about their shortness of breath and what they can do to improve. Patient has been informed of breathing techniques when starting the program. Patient is informed to tell staff if they have had any med changes and that certain meds they are taking or not taking can be causing shortness of breath. Tye Maryland states that she would like to lose a little bit of weight with a goal of 165-170 lbs. She also has been monitoring her O2 saturations at home and reports that they are  staying above 95. She also states that she has experienced very little SOB with her ADL's. Tye Maryland has not been checking her BP at home but does have a BP cuff and was encouraged to start monitoring it at home. Tye Maryland is doing well in rehab.  Her weight is starting to come back down.  Her breathing is doing better, she is able to get up and down more and doing more going up the stairs and needing less help from her daughter. She is doing well on her meds.  She has only been using her nebulizer when she needs it but hasn't needed it much.  She had esphogeal spasms recently and has found that peppermint oil helps relieve her symptoms.  She has linked it to either pepper or  noddle/rice getting caught.    Expected Outcomes Short: Attend [name] regularly to improve shortness of breath with ADL's. Long: maintain independence with ADL's Short: Attend [name] regularly to improve shortness of breath with ADL's. Long: maintain independence with ADL's Short: Attend [name] regularly to improve shortness of breath with ADL's. Long: Continue to monitor lifestyle risk factors. Short; COntinue to work on weight loss lOng: conitnue to work on breathing             Core Components/Risk Factors/Patient Goals at Discharge (Final Review):   Goals and Risk Factor Review - 09/19/22 1137       Core Components/Risk Factors/Patient Goals Review   Personal Goals Review Improve shortness of breath with ADL's;Weight Management/Obesity;Increase knowledge of respiratory medications and ability to use respiratory devices properly.    Review Tye Maryland is doing well in rehab.  Her weight is starting to come back down.  Her breathing is doing better, she is able to get up and down more and doing more going up the stairs and needing less help from her daughter. She is doing well on her meds.  She has only been using her nebulizer when she needs it but hasn't needed it much.  She had esphogeal spasms recently and has found that peppermint oil helps relieve her symptoms.  She has linked it to either pepper or noddle/rice getting caught.    Expected Outcomes Short; COntinue to work on weight loss lOng: conitnue to work on breathing             ITP Comments:  ITP Comments     Row Name 07/12/22 1346 07/20/22 1546 07/25/22 1012 08/17/22 0942 09/14/22 0810   ITP Comments Initial telephone orientation completed. Diagnosis can be found in [facility] 8/16. EP orientation scheduled for Wednesday 10/4 at 2:30. Patient presented for orientation today, 10/4. Patient wore open-toed shoes and therefore could not complete 6MWT. Will do 6MWT on Monday, 10/9. First full day of exercise!  Patient was oriented to  gym and equipment including functions, settings, policies, and procedures.  Patient's individual exercise prescription and treatment plan were reviewed.  All starting workloads were established based on the results of the 6 minute walk test done at initial orientation visit.  The plan for exercise progression was also introduced and progression will be customized based on patient's performance and goals. 30 Day review completed. Medical Director ITP review done, changes made as directed, and signed approval by Medical Director.   NEW TO PROGRAM 30 Day review completed. Medical Director ITP review done, changes made as directed, and signed approval by Medical Director.    [place] Name 10/12/22 1023 10/13/22 1444 11/09/22 0942 11/14/22 1617 12/07/22 1304   ITP Comments 30 Day  review completed. Medical Director ITP review done, changes made as directed, and signed approval by Medical Director. Called to check on patient.  She has been out sick.    It has gone through the whole family.  She is feeling better and hopes to return soon. 30 Day review completed. Medical Director ITP review done, changes made as directed, and signed approval by Medical Director.   remains out Called to check on patient.  Last attended on 10/26/22.  She saw her pulmonologist and they think something may be going on with her heart and requested that she hold off until meeting with cardiology next week and is scheduled for a cath possibly. She is now on hold until we hear from cardiology. 30 day review completed. ITP sent to Dr. Zetta Bills, Medical Director of  Pulmonary Rehab. Continue with ITP unless changes are made by physician.  Pt currently out on medical hold for heart workup. Pt is scheduled for cath next month.    [place] Name 01/04/23 0827           ITP Comments 30 Day review completed. Medical Director ITP review done, changes made as directed, and signed approval by Medical Director.                Comments:

## 2023-01-06 ENCOUNTER — Ambulatory Visit: Payer: BC Managed Care – PPO | Attending: Internal Medicine | Admitting: Internal Medicine

## 2023-01-06 NOTE — Progress Notes (Deleted)
Follow-up Outpatient Visit Date: 01/06/2023  Primary Care Provider: Glean Hess, MD [address] [place] [address]  Chief Complaint: ***  HPI:  Ms. Renna is a 67 y.o. 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  I met her last month; she was referred for right and left heart catheterization for further workup of her pulmonary hypertension and pulmonary sarcoidosis.  She also noted some atypical chest pain at that time.  Authorization 2 weeks ago showed mild luminal irregularities involving the ostial and proximal LAD but otherwise no angiographically significant coronary artery disease.  Mild pulmonary hypertension was noted.Marland Kitchen  --------------------------------------------------------------------------------------------------  Past Medical History:  Diagnosis Date   Acute renal failure ([place])    Acute respiratory failure ([place]) 10/23/2021   Acute respiratory failure with hypoxia ([facility])    ARF (acute respiratory failure) ([place]) 10/19/2021   Arrhythmia    patient unaware if this is current   Asthma    Chronic kidney disease    Critical lower limb ischemia ([facility])    Depression    GERD (gastroesophageal reflux disease)    Heart murmur    History of kidney stones    HOH (hard of hearing)    wear aids   Hyperthyroidism    Hypothyroidism    IBS (irritable bowel syndrome)    Pneumonia    Pulmonary hypertension ([place])    Sarcoid    Sarcoidosis    Seasonal allergies    Sleep apnea CPAP with O2   Stroke ([place]) 07/2020   watershed   Wears hearing aid in both ears    Past Surgical History:  Procedure Laterality Date   ABDOMINAL HYSTERECTOMY     partial    AMPUTATION Left 01/14/2021   Procedure: AMPUTATION RAY (1ST TOE ) ( 2ND METATARSAL HEAD RESECTION);  Surgeon: Algernon Huxley, MD;  Location: [facility];  Service: Vascular;  Laterality: Left;   CARDIAC CATHETERIZATION  10/18/2018   [facility]   CATARACT EXTRACTION W/PHACO Left 07/31/2019   Procedure: CATARACT EXTRACTION PHACO AND INTRAOCULAR LENS PLACEMENT (IOC) LEFT 00:51.1  17.9%  9.15;  Surgeon: Leandrew Koyanagi, MD;  Location: [facility];  Service: Ophthalmology;  Laterality: Left;  keep this patient second   COLON SURGERY     "colon was fused to bladder - operated on both"   COLONOSCOPY  09/18/2007   diverticuli, no polyps   COLONOSCOPY  05/26/2010   diverticuli, no polyps   CYSTOSCOPY WITH STENT PLACEMENT Bilateral 07/14/2020   Procedure: CYSTOSCOPY WITH STENT PLACEMENT, RETROPYLOGRAM;  Surgeon: Billey Co, MD;  Location: [facility];  Service: Urology;  Laterality: Bilateral;   CYSTOSCOPY/URETEROSCOPY/HOLMIUM LASER/STENT PLACEMENT Left 02/20/2020   Procedure: CYSTOSCOPY/URETEROSCOPY/LITHOTRIPSY /STENT PLACEMENT;  Surgeon: Hollice Espy, MD;  Location: [facility];  Service: Urology;  Laterality: Left;   CYSTOSCOPY/URETEROSCOPY/HOLMIUM LASER/STENT PLACEMENT Bilateral 10/02/2020   Procedure: CYSTOSCOPY/URETEROSCOPY/HOLMIUM LASER/STENT PLACEMENT;  Surgeon: Billey Co, MD;  Location: [facility];  Service: Urology;  Laterality: Bilateral;   EYE SURGERY     KYPHOPLASTY N/A 05/10/2021   Procedure: KYPHOPLASTY, L2, L3, L4;  Surgeon: Hessie Knows, MD;  Location: [facility];  Service: Orthopedics;  Laterality: N/A;   KYPHOPLASTY N/A 06/25/2021   Procedure: T10, T12, L1 KYPHOPLASTY;  Surgeon: Hessie Knows, MD;  Location: [facility];  Service:  Orthopedics;  Laterality: N/A;   PARS PLANA VITRECTOMY Right 05/20/2015   Procedure: PARS PLANA VITRECTOMY WITH 25 GAUGE, laser;  Surgeon: Milus Height, MD;  Location: [facility];  Service: Ophthalmology;  Laterality: Right;   PARTIAL HYSTERECTOMY  1990    RIGHT/LEFT HEART CATH AND CORONARY ANGIOGRAPHY Bilateral 12/23/2022   Procedure: RIGHT/LEFT HEART CATH AND CORONARY ANGIOGRAPHY;  Surgeon: Nelva Bush, MD;  Location: [facility];  Service: Cardiovascular;  Laterality: Bilateral;   TEE WITHOUT CARDIOVERSION N/A 07/27/2020   Procedure: TRANSESOPHAGEAL ECHOCARDIOGRAM (TEE);  Surgeon: Teodoro Spray, MD;  Location: [facility];  Service: Cardiovascular;  Laterality: N/A;   TUBAL LIGATION     WISDOM TOOTH EXTRACTION      No outpatient medications have been marked as taking for the 01/06/23 encounter (Appointment) with Ayomide Purdy, Harrell Gave, MD.    Allergies: Nitrofurantoin, Plaquenil [hydroxychloroquine], Tramadol, and Sulfa antibiotics  Social History   Tobacco Use   Smoking status: Never   Smokeless tobacco: Never  Vaping Use   Vaping Use: Never used  Substance Use Topics   Alcohol use: No   Drug use: No    Family History  Problem Relation Age of Onset   Allergies Father    Asthma Father    Colon cancer Father    Allergies Brother    Pancreatic cancer Brother    Asthma Brother    Breast cancer Maternal Grandmother     Review of Systems: A 12-system review of systems was performed and was negative except as noted in the HPI.  --------------------------------------------------------------------------------------------------  Physical Exam: LMP  (LMP Unknown)   General:  NAD. Neck: No JVD or HJR. Lungs: Clear to auscultation bilaterally without wheezes or crackles. Heart: Regular rate and rhythm without murmurs, rubs, or gallops. Abdomen: Soft, nontender, nondistended. Extremities: No lower extremity edema.  EKG:  ***  Lab Results  Component Value Date   WBC 15.0 (H) 12/20/2022   HGB 12.6 12/23/2022   HCT 37.0 12/23/2022   MCV 89.1 12/20/2022   PLT 296 12/20/2022    Lab Results  Component Value Date   NA 142 12/23/2022   K 3.1 (L) 12/23/2022   CL 101 12/20/2022   CO2 27 12/20/2022   BUN 30 (H)  12/20/2022   CREATININE 1.18 (H) 12/20/2022   GLUCOSE 144 (H) 12/20/2022   ALT 15 06/24/2022    Lab Results  Component Value Date   CHOL 289 (H) 07/21/2020   HDL 33 (L) 07/21/2020   LDLCALC 198 (H) 07/21/2020   TRIG 290 (H) 07/21/2020   CHOLHDL 8.8 07/21/2020    --------------------------------------------------------------------------------------------------  ASSESSMENT AND PLAN: Nelva Bush, MD 01/06/2023 1:44 PM

## 2023-01-09 ENCOUNTER — Ambulatory Visit: Admission: RE | Admit: 2023-01-09 | Payer: BC Managed Care – PPO | Source: Ambulatory Visit

## 2023-01-12 ENCOUNTER — Telehealth: Payer: Self-pay

## 2023-01-12 DIAGNOSIS — D860 Sarcoidosis of lung: Secondary | ICD-10-CM

## 2023-01-12 NOTE — Telephone Encounter (Signed)
Left voicemail for patient to follow up, left message. Has not attended since 12/16 since being placed on medical hold. If no response back, will send letter at that time.

## 2023-01-19 DIAGNOSIS — G4733 Obstructive sleep apnea (adult) (pediatric): Secondary | ICD-10-CM | POA: Diagnosis not present

## 2023-01-19 DIAGNOSIS — I10 Essential (primary) hypertension: Secondary | ICD-10-CM | POA: Diagnosis not present

## 2023-01-19 NOTE — Progress Notes (Signed)
Called patient, she has an echo on 4/16. Wait until then for results and get clearance to come back. Patient understood. Potential new referral?

## 2023-01-23 DIAGNOSIS — M810 Age-related osteoporosis without current pathological fracture: Secondary | ICD-10-CM | POA: Diagnosis not present

## 2023-01-23 DIAGNOSIS — D869 Sarcoidosis, unspecified: Secondary | ICD-10-CM | POA: Diagnosis not present

## 2023-01-31 ENCOUNTER — Ambulatory Visit: Payer: BC Managed Care – PPO

## 2023-02-01 ENCOUNTER — Encounter: Payer: Self-pay | Admitting: *Deleted

## 2023-02-01 DIAGNOSIS — D860 Sarcoidosis of lung: Secondary | ICD-10-CM

## 2023-02-01 NOTE — Progress Notes (Addendum)
Pulmonary Individual Treatment Plan  Patient Details  Name: Stacie [name] MRN: [id number] Date of Birth: 23-Jun-1956 Referring Provider:   Flowsheet Row Pulmonary Rehab from 07/25/2022 in [facility] Cardiac and Pulmonary Rehab  Referring Provider Vida Rigger, MD       Initial Encounter Date:  Flowsheet Row Pulmonary Rehab from 07/25/2022 in [facility] Cardiac and Pulmonary Rehab  Date 07/25/22       Visit Diagnosis: Sarcoidosis of lung  Patient's Home Medications on Admission:  Current Outpatient Medications:    acetaminophen (TYLENOL) 500 MG tablet, Take 500 mg by mouth every 6 (six) hours as needed for moderate pain or headache., Disp: , Rfl:    albuterol (ACCUNEB) 1.25 MG/3ML nebulizer solution, Take 1 ampule by nebulization as needed., Disp: , Rfl:    azithromycin (ZITHROMAX) 250 MG tablet, Take 250 mg by mouth 3 (three) times a week., Disp: , Rfl:    budesonide (PULMICORT) 0.25 MG/2ML nebulizer solution, Take 0.25 mg by nebulization daily as needed., Disp: , Rfl:    denosumab (PROLIA) 60 MG/ML SOSY injection, Inject 60 mg into the skin every 6 (six) months., Disp: , Rfl:    dorzolamide (TRUSOPT) 2 % ophthalmic solution, Place 1 drop into both eyes 2 (two) times daily., Disp: , Rfl:    escitalopram (LEXAPRO) 20 MG tablet, Take 1 tablet (20 mg total) by mouth daily., Disp: 90 tablet, Rfl: 3   Fluticasone-Umeclidin-Vilant (TRELEGY ELLIPTA) 200-62.5-25 MCG/ACT AEPB, Inhale into the lungs daily., Disp: , Rfl:    latanoprost (XALATAN) 0.005 % ophthalmic solution, Place 1 drop into the right eye 2 (two) times daily., Disp: , Rfl:    levothyroxine (SYNTHROID) 137 MCG tablet, Take 1 tablet (137 mcg total) by mouth daily before breakfast., Disp: 90 tablet, Rfl: 3   Multiple Vitamins-Minerals (MULTIVITAMIN WITH MINERALS) tablet, Take 1 tablet by mouth daily., Disp: , Rfl:    naproxen sodium (ALEVE) 220 MG tablet, Take 220 mg by mouth daily as needed (pain)., Disp: , Rfl:    NON FORMULARY, CPAP  nightly, Disp: , Rfl:    OXYGEN, Inhale 2 L into the lungs daily as needed (During walking and activity). , Disp: , Rfl:    predniSONE (DELTASONE) 10 MG tablet, Take 2 tablets (20 mg total) by mouth daily. Resume only after the finishing new Rx for prednisone (Patient taking differently: Take 15 mg by mouth daily.), Disp: , Rfl:    Probiotic Product (PROBIOTIC PO), Take 1 capsule by mouth daily., Disp: , Rfl:    Treprostinil (TYVASO) 0.6 MG/ML SOLN, Inhale into the lungs See admin instructions. 12 breaths 4 times a day, Disp: , Rfl:   Past Medical History: Past Medical History:  Diagnosis Date   Acute renal failure (HCC)    Acute respiratory failure (HCC) 10/23/2021   Acute respiratory failure with hypoxia (HCC)    ARF (acute respiratory failure) (HCC) 10/19/2021   Arrhythmia    patient unaware if this is current   Asthma    Chronic kidney disease    Critical lower limb ischemia (HCC)    Depression    GERD (gastroesophageal reflux disease)    Heart murmur    History of kidney stones    HOH (hard of hearing)    wear aids   Hyperthyroidism    Hypothyroidism    IBS (irritable bowel syndrome)    Pneumonia    Pulmonary hypertension (HCC)    Sarcoid    Sarcoidosis    Seasonal allergies    Sleep apnea CPAP with  O2   Stroke (HCC) 07/2020   watershed   Wears hearing aid in both ears     Tobacco Use: Social History   Tobacco Use  Smoking Status Never  Smokeless Tobacco Never    Labs: Review Flowsheet  More data exists      Latest Ref Rng & Units 07/21/2020 01/14/2021 10/19/2021 06/24/2022 12/23/2022  Labs for ITP Cardiac and Pulmonary Rehab  Cholestrol 0 - 200 mg/dL 161  - - - -  LDL (calc) 0 - 99 mg/dL 096  - - - -  HDL-C >04 mg/dL 33  - - - -  Trlycerides <150 mg/dL 540  - - - -  Hemoglobin A1c 4.8 - 5.6 % - - - 6.1  -  PH, Arterial 7.35 - 7.45 - - - - 7.413   PCO2 arterial 32 - 48 mmHg - - - - 44.3   Bicarbonate 20.0 - 28.0 mmol/L - - 31.7  - 28.3  30.0   TCO2 22 - 32 mmol/L  - 30  - - 30  32   O2 Saturation % - - 33.6  - 95  65      Pulmonary Assessment Scores:  Pulmonary Assessment Scores     Row Name 08/15/22 1643         ADL UCSD   ADL Phase Entry     SOB Score total 88     Rest 0     Walk 3     Stairs 5     Bath 5     Dress 5     Shop 5       CAT Score   CAT Score 21              UCSD: Self-administered rating of dyspnea associated with activities of daily living (ADLs) 6-point scale (0 = "not at all" to 5 = "maximal or unable to do because of breathlessness")  Scoring Scores range from 0 to 120.  Minimally important difference is 5 units  CAT: CAT can identify the health impairment of COPD patients and is better correlated with disease progression.  CAT has a scoring range of zero to 40. The CAT score is classified into four groups of low (less than 10), medium (10 - 20), high (21-30) and very high (31-40) based on the impact level of disease on health status. A CAT score over 10 suggests significant symptoms.  A worsening CAT score could be explained by an exacerbation, poor medication adherence, poor inhaler technique, or progression of COPD or comorbid conditions.  CAT MCID is 2 points  mMRC: mMRC (Modified Medical Research Council) Dyspnea Scale is used to assess the degree of baseline functional disability in patients of respiratory disease due to dyspnea. No minimal important difference is established. A decrease in score of 1 point or greater is considered a positive change.   Pulmonary Function Assessment:   Exercise Target Goals: Exercise Program Goal: Individual exercise prescription set using results from initial 6 min walk test and THRR while considering  patient's activity barriers and safety.   Exercise Prescription Goal: Initial exercise prescription builds to 30-45 minutes a day of aerobic activity, 2-3 days per week.  Home exercise guidelines will be given to patient during program as part of exercise prescription  that the participant will acknowledge.  Education: Aerobic Exercise: - Group verbal and visual presentation on the components of exercise prescription. Introduces F.I.T.T principle from ACSM for exercise prescriptions.  Reviews F.I.T.T. principles of aerobic  exercise including progression. Written material given at graduation. Flowsheet Row Pulmonary Rehab from 08/10/2018 in [facility] Cardiac and Pulmonary Rehab  Date 05/02/18  Educator [facility]  Instruction Review Code 1- Verbalizes Understanding       Education: Resistance Exercise: - Group verbal and visual presentation on the components of exercise prescription. Introduces F.I.T.T principle from ACSM for exercise prescriptions  Reviews F.I.T.T. principles of resistance exercise including progression. Written material given at graduation.    Education: Exercise & Equipment Safety: - Individual verbal instruction and demonstration of equipment use and safety with use of the equipment. Flowsheet Row Pulmonary Rehab from 08/10/2022 in [facility] Cardiac and Pulmonary Rehab  Date 07/25/22  Educator NT  Instruction Review Code 1- Verbalizes Understanding       Education: Exercise Physiology & General Exercise Guidelines: - Group verbal and written instruction with models to review the exercise physiology of the cardiovascular system and associated critical values. Provides general exercise guidelines with specific guidelines to those with heart or lung disease.  Flowsheet Row Pulmonary Rehab from 08/10/2018 in [facility] Cardiac and Pulmonary Rehab  Date 07/11/18  Educator [facility]  Instruction Review Code 1- Verbalizes Understanding       Education: Flexibility, Balance, Mind/Body Relaxation: - Group verbal and visual presentation with interactive activity on the components of exercise prescription. Introduces F.I.T.T principle from ACSM for exercise prescriptions. Reviews F.I.T.T. principles of flexibility and balance exercise training including progression.  Also discusses the mind body connection.  Reviews various relaxation techniques to help reduce and manage stress (i.e. Deep breathing, progressive muscle relaxation, and visualization). Balance handout provided to take home. Written material given at graduation. Flowsheet Row Pulmonary Rehab from 08/10/2018 in [facility] Cardiac and Pulmonary Rehab  Date 08/08/18  Educator AS  Instruction Review Code 1- Verbalizes Understanding       Activity Barriers & Risk Stratification:   6 Minute Walk:  Oxygen Initial Assessment:   Oxygen Re-Evaluation:  Oxygen Re-Evaluation     Row Name 08/29/22 1131 09/19/22 1141           Program Oxygen Prescription   Program Oxygen Prescription Continuous Continuous      Liters per minute 2 2        Home Oxygen   Home Oxygen Device Portable Concentrator;Home Concentrator Portable Concentrator;Home Concentrator      Sleep Oxygen Prescription CPAP CPAP      Liters per minute 2 2      Home Exercise Oxygen Prescription Continuous Continuous      Liters per minute 2 2      Home Resting Oxygen Prescription Continuous Continuous      Liters per minute 2 2      Compliance with Home Oxygen Use Yes Yes        Goals/Expected Outcomes   Short Term Goals To learn and exhibit compliance with exercise, home and travel O2 prescription;To learn and understand importance of monitoring SPO2 with pulse oximeter and demonstrate accurate use of the pulse oximeter.;To learn and understand importance of maintaining oxygen saturations>88%;To learn and demonstrate proper pursed lip breathing techniques or other breathing techniques. ;To learn and demonstrate proper use of respiratory medications To learn and exhibit compliance with exercise, home and travel O2 prescription;To learn and understand importance of monitoring SPO2 with pulse oximeter and demonstrate accurate use of the pulse oximeter.;To learn and understand importance of maintaining oxygen saturations>88%;To learn and  demonstrate proper pursed lip breathing techniques or other breathing techniques. ;To learn and demonstrate proper use of respiratory medications  Long  Term Goals Exhibits compliance with exercise, home  and travel O2 prescription;Verbalizes importance of monitoring SPO2 with pulse oximeter and return demonstration;Maintenance of O2 saturations>88%;Exhibits proper breathing techniques, such as pursed lip breathing or other method taught during program session;Compliance with respiratory medication;Demonstrates proper use of MDI's Exhibits compliance with exercise, home  and travel O2 prescription;Verbalizes importance of monitoring SPO2 with pulse oximeter and return demonstration;Maintenance of O2 saturations>88%;Exhibits proper breathing techniques, such as pursed lip breathing or other method taught during program session;Compliance with respiratory medication;Demonstrates proper use of MDI's      Comments Reviewed PLB technique with pt.  Talked about how it works and it's importance in maintaining their exercise saturations. Stacie [name] has a pulse oximeter at home and has been checking her O2 saturations. When she walks at home she reports that she does experience desaturation but she pauses to let her oxygen come back to normal levels. Stacie [name] is doing well in rehab.  She is doing well on her oxgygen at home and saturations are staying up.  She is doing well with her CPAP but has started a new mask type that is not fitting as well.  She may go back to other form.  She is feeling good with her breathing and using her PLB consistently.      Goals/Expected Outcomes Short: Become more profiecient at using PLB.   Long: Become independent at using PLB. Short: Check on CPAP mask again Long: Conitnue to use PLB               Oxygen Discharge (Final Oxygen Re-Evaluation):  Oxygen Re-Evaluation - 09/19/22 1141       Program Oxygen Prescription   Program Oxygen Prescription Continuous    Liters per minute 2       Home Oxygen   Home Oxygen Device Portable Concentrator;Home Concentrator    Sleep Oxygen Prescription CPAP    Liters per minute 2    Home Exercise Oxygen Prescription Continuous    Liters per minute 2    Home Resting Oxygen Prescription Continuous    Liters per minute 2    Compliance with Home Oxygen Use Yes      Goals/Expected Outcomes   Short Term Goals To learn and exhibit compliance with exercise, home and travel O2 prescription;To learn and understand importance of monitoring SPO2 with pulse oximeter and demonstrate accurate use of the pulse oximeter.;To learn and understand importance of maintaining oxygen saturations>88%;To learn and demonstrate proper pursed lip breathing techniques or other breathing techniques. ;To learn and demonstrate proper use of respiratory medications    Long  Term Goals Exhibits compliance with exercise, home  and travel O2 prescription;Verbalizes importance of monitoring SPO2 with pulse oximeter and return demonstration;Maintenance of O2 saturations>88%;Exhibits proper breathing techniques, such as pursed lip breathing or other method taught during program session;Compliance with respiratory medication;Demonstrates proper use of MDI's    Comments Stacie [name] is doing well in rehab.  She is doing well on her oxgygen at home and saturations are staying up.  She is doing well with her CPAP but has started a new mask type that is not fitting as well.  She may go back to other form.  She is feeling good with her breathing and using her PLB consistently.    Goals/Expected Outcomes Short: Check on CPAP mask again Long: Conitnue to use PLB             Initial Exercise Prescription:   Perform Capillary Blood Glucose checks as needed.  Exercise Prescription Changes:   Exercise Prescription Changes     Row Name 08/09/22 1400 08/22/22 1100 09/20/22 1500 10/04/22 1500 11/01/22 0800     Response to Exercise   Blood Pressure (Admit) 120/62 130/70 132/74 140/70  140/68   Blood Pressure (Exercise) 138/76 138/62 128/72 -- --   Blood Pressure (Exit) 140/82 134/72 122/64 124/70 132/62   Heart Rate (Admit) 97 bpm 96 bpm 103 bpm 107 bpm 104 bpm   Heart Rate (Exercise) 127 bpm 119 bpm 121 bpm 119 bpm 110 bpm   Heart Rate (Exit) 109 bpm 104 bpm 105 bpm 116 bpm 101 bpm   Oxygen Saturation (Admit) 96 % 95 % 97 % 94 % 90 %   Oxygen Saturation (Exercise) 89 % 91 % 89 % 89 % 93 %   Oxygen Saturation (Exit) 96 % 95 % 95 % 95 % 93 %   Rating of Perceived Exertion (Exercise) Perceived Dyspnea (Exercise) Symptoms SOB SOB SOB SOB SOB   Comments First 3 sessions in rehab -- -- -- --   Duration Continue with 30 min of aerobic exercise without signs/symptoms of physical distress. Continue with 30 min of aerobic exercise without signs/symptoms of physical distress. Continue with 30 min of aerobic exercise without signs/symptoms of physical distress. Continue with 30 min of aerobic exercise without signs/symptoms of physical distress. Continue with 30 min of aerobic exercise without signs/symptoms of physical distress.   Intensity THRR unchanged THRR unchanged THRR unchanged THRR unchanged THRR unchanged     Progression   Progression Continue to progress workloads to maintain intensity without signs/symptoms of physical distress. Continue to progress workloads to maintain intensity without signs/symptoms of physical distress. Continue to progress workloads to maintain intensity without signs/symptoms of physical distress. Continue to progress workloads to maintain intensity without signs/symptoms of physical distress. Continue to progress workloads to maintain intensity without signs/symptoms of physical distress.   Average METs 1.9 2.07 2.03 2.28 1.8     Resistance Training   Training Prescription Yes Yes Yes Yes Yes   Weight 3 lb 3 lb 3 lb 3 lb 3 lb   Reps 10-15 10-15 10-15 10-15 10-15     Interval Training   Interval Training No No No No  No     Oxygen   Oxygen Continuous Continuous Continuous Continuous Continuous   Liters 3 3 3-4 4 4      Treadmill   MPH 1.1 1.2 1 1.2 --   Grade 0 0 0 0 --   Minutes --   METs 1.84 1.92 1.8 1.92 --     Recumbant Bike   Level -- -- 1.2 -- --   Watts -- -- 15 -- --   Minutes -- -- 15 -- --   METs -- -- 2.54 -- --     NuStep   Level -- Minutes -- METs -- 2 1.7 2 1.5     REL-XR   Level Minutes METs 2.5 2.9 2.4 3.2 2.1     T5 Nustep   Level 1 1 -- -- --   Minutes 15 15 -- -- --   METs 1.6 2 -- -- --     Biostep-RELP   Level -- -- -- 1 --   Minutes -- -- --  15 --   METs -- -- -- 2 --     Oxygen   Maintain Oxygen Saturation 88% or higher 88% or higher 88% or higher 88% or higher 88% or higher            Exercise Comments:   Exercise Goals and Review:   Exercise Goals Re-Evaluation :  Exercise Goals Re-Evaluation     Row Name 08/08/22 1108 08/09/22 1419 08/22/22 1200 08/29/22 1115 09/19/22 1127     Exercise Goal Re-Evaluation   Exercise Goals Review Understanding of Exercise Prescription;Increase Physical Activity;Increase Strength and Stamina Understanding of Exercise Prescription;Increase Physical Activity;Increase Strength and Stamina Understanding of Exercise Prescription;Increase Physical Activity;Increase Strength and Stamina Understanding of Exercise Prescription;Increase Physical Activity;Increase Strength and Stamina Understanding of Exercise Prescription;Increase Physical Activity;Increase Strength and Stamina   Comments Stacie [name] feels that she is doing well with exercise. She states that she feels stronger since starting the program. She stated that she has not seen a huge benefit in her breathing but she wants to continue to work towards that goal. She does also state that she felt over exerted last week, but states it could have been due to being ill. We will continue to monitor her progress  in the program. Stacie [name] is doing well in rehab. Her average overall MET level after her first 3 sessions was 1.9 METs. She also has done well walking on the treadmill at 1.1 mph without desaturating. She has tolerated 3 lb hand weights for resistance training as well. We will continue to monitor her progress in the program. Stacie [name] continues to do well in rehab. She was able to get her speed on the treadmill to 1.2 mph, though walking is challenging for her. She has not increased her level on other seated machines but all are reflecting appropriate RPEs. She should work toward increasing to level 2 on the T4 Nustep. Oxygen saturations are staying above 88%. Will continue to monitor. Stacie [name] is doing well in rehab. She states that she has more energy since starting the program. She has been walking on the treadmill at a speed of 1.2 mph, but has had some trouble with her oxygen saturation dropping below 88. Stacie [name] has been doing some walking at home  about 2-3 times a week as well, and feels she has seen some improvement there as well. We will continue to monitor her progress in the program. Stacie [name] is doing well in rehab.  She feels good with her exercise in class.  She is walking on deck at home on her off days.  She is also using her pedal machine at home.  She has been doing her stairs more at home.  She is feeling stronger overall and feeling better.   Expected Outcomes Short: Continue to follow current exercise prescription. Long: Continue to increase strength and stamina. Short: Continue to increase workloads as tolerated. Long: Continue to increase strength and stamina. Short: Increase to level 2 on T4 Nustep. Long: Continue to increase overall MET level Short: Continue to walk at home on days away from rehab. Long: Continue to increase strength and stamina. Short: Conitnue to add in more walking at home Long: conitnue to improve stamina.    Row Name 09/20/22 1503 10/04/22 1505 10/19/22 1124 11/01/22 0852 11/15/22  1238     Exercise Goal Re-Evaluation   Exercise Goals Review Understanding of Exercise Prescription;Increase Physical Activity;Increase Strength and Stamina Understanding of Exercise Prescription;Increase Physical Activity;Increase Strength and Stamina Understanding of Exercise Prescription;Increase Physical Activity;Increase Strength and  Stamina Understanding of Exercise Prescription;Increase Physical Activity;Increase Strength and Stamina --   Comments Stacie [name] has been having intermittent attendance due to being sick. Once returned, she was able to increase to level 4 on the T4 Nustep. She would benefit from increasing her level on the XR. Perhaps we can also add a small incline to her TM as her speed has been consistent. Oxygen saturations are staying above 88%. Will continue to monitor. Stacie [name] is doing well in the program. She recently increased her overall average MET level of 2.28 METs. She also was able to increase her speed back up to 1.2 mph on the treadmill. She also has kept her oxygen saturations above 88% since she began using 3L of O2 for exercise. We will continue to monitor her progress in the program. Stacie [name] has not attended since last review. She has been out sick and staff will continue to follow up with patient on when she is feeling better and able to come back. Stacie [name] has only attended rehab once since the last review. She returned from being sick and her workloads on both the T4 and XR were both down to level 1. She also was not able to do any walking on the treadmill during her first session back in rehab. We will continue to monitor her progress as she returns to consistent attendance. Out since last review   Expected Outcomes Short: Increase level on XR Long: Continue to increase overall MET level Short: Increase level on XR. Long: Continue to improve strength and stamina. Short: Return to rehab and maintain good attendance Long: Graduate from [employer] Short: Increase workloads  back up to the levels prior to sickness. Long: Continue to improve strength and stamina. --    Row Name 12/12/22 1547 12/27/22 1501 01/10/23 1506 01/23/23 1412       Exercise Goal Re-Evaluation   Exercise Goals Review -- Understanding of Exercise Prescription;Increase Physical Activity;Increase Strength and Stamina Understanding of Exercise Prescription;Increase Physical Activity;Increase Strength and Stamina Understanding of Exercise Prescription;Increase Physical Activity;Increase Strength and Stamina    Comments Stacie [name] has not been here since last review. She has a scheduled heart cath for 3/8 and we will wait to hear back from her doctor on results on next steps. Patient is currently on a medical hold. Stacie [name] has not been here since last review. She had a heart cath on 3/8. We attempted to call patient to follow up but had to leave a message. We will wait to hear back from her doctor on results and next steps. Patient is currently on a medical hold. Stacie [name] has yet to return to rehab. Last day attended was on 1/15. She had been placed on a medical hold and had a heart cath completed. Will update with patient to see how she is doing, get clearance to return. Possible discharge if no response from patient. We recently called patient to follow up and left message. Has not attended since 12/16 since being placed on medical hold. If no response back, will send letter at that time.    Expected Outcomes Short: Wait to hear back on test results, get clearance to return Long: Graduate from [employer] Short: Wait to hear back on test results, get clearance to return. Long: Graduate from [employer]. Short: Return to rehab when cleared Long: Graduate from [employer]: Return to rehab when cleared Long: Graduate from [employer]             Discharge Exercise Prescription (Final Exercise Prescription Changes):  Exercise  Prescription Changes - 11/01/22 0800       Response to Exercise   Blood Pressure (Admit)  140/68    Blood Pressure (Exit) 132/62    Heart Rate (Admit) 104 bpm    Heart Rate (Exercise) 110 bpm    Heart Rate (Exit) 101 bpm    Oxygen Saturation (Admit) 90 %    Oxygen Saturation (Exercise) 93 %    Oxygen Saturation (Exit) 93 %    Rating of Perceived Exertion (Exercise) 14    Perceived Dyspnea (Exercise) 2    Symptoms SOB    Duration Continue with 30 min of aerobic exercise without signs/symptoms of physical distress.    Intensity THRR unchanged      Progression   Progression Continue to progress workloads to maintain intensity without signs/symptoms of physical distress.    Average METs 1.8      Resistance Training   Training Prescription Yes    Weight 3 lb    Reps 10-15      Interval Training   Interval Training No      Oxygen   Oxygen Continuous    Liters 4      NuStep   Level 1    Minutes 15    METs 1.5      REL-XR   Level 1    Minutes 15    METs 2.1      Oxygen   Maintain Oxygen Saturation 88% or higher             Nutrition:  Target Goals: Understanding of nutrition guidelines, daily intake of sodium 1500mg , cholesterol 200mg , calories 30% from fat and 7% or less from saturated fats, daily to have 5 or more servings of fruits and vegetables.  Education: All About Nutrition: -Group instruction provided by verbal, written material, interactive activities, discussions, models, and posters to present general guidelines for heart healthy nutrition including fat, fiber, MyPlate, the role of sodium in heart healthy nutrition, utilization of the nutrition label, and utilization of this knowledge for meal planning. Follow up email sent as well. Written material given at graduation. Flowsheet Row Pulmonary Rehab from 08/10/2018 in [facility] Cardiac and Pulmonary Rehab  Date 06/27/18  Educator LB  Instruction Review Code 1- Verbalizes Understanding       Biometrics:    Nutrition Therapy Plan and Nutrition Goals:   Nutrition Assessments:  MEDIFICTS  Score Key: ?70 Need to make dietary changes  40-70 Heart Healthy Diet ? 40 Therapeutic Level Cholesterol Diet  Flowsheet Row Pulmonary Rehab from 08/15/2022 in [facility] Cardiac and Pulmonary Rehab  Picture Your Plate Total Score on Admission 58      Picture Your Plate Scores: <96 Unhealthy dietary pattern with much room for improvement. 41-50 Dietary pattern unlikely to meet recommendations for good health and room for improvement. 51-60 More healthful dietary pattern, with some room for improvement.  >60 Healthy dietary pattern, although there may be some specific behaviors that could be improved.   Nutrition Goals Re-Evaluation:  Nutrition Goals Re-Evaluation     Row Name 08/08/22 1113 08/29/22 1124 09/19/22 1134         Goals   Nutrition Goal ST: practice MyPlate guidelines (adding in 1-2 additional servings of non-starchy vegetables), be sure to include at least 1 good source of protein at meals and snacks, limit sodium by reading food labels and using other ways to flavor food LT: limit Na <1.5g/day, have at least 5 servings of vegetables/day -- Short: She will continue to eat  fruits and vegetables daily, and to read food labels. Long: Continue to follow healthy eating patterns discussed with RD.     Comment She has been making smoothies regularly and has been adding more vegetables than fruit to them to help increase her intake. Including fruits and vegetables daily. She continues to read food labels regularly and feels that she has been able to maintain new healthier habits -- Stacie [name] is doing well with her diet.  Her weight is going back down now.  She is getting in more protein and getting in eggs and yogurt for breakfast. She is working on reading her food labels and watching sodium and sugar intake.  Her daughhter is helping her keep on top of her diet.     Expected Outcome Short: She will continue to eat fruits and vegetables daily, and to read food labels. Long: Continue to follow  healthy eating patterns discussed with RD. Short: She will continue to eat fruits and vegetables daily, and to read food labels. Long: Continue to follow healthy eating patterns discussed with RD. Short: Continue to work on adding in protein Long: Continue to work on healthy eating       Personal Goal #2 Re-Evaluation   Personal Goal #2 -- Continue to educate yourself on foods high in sodium and added sugar. Look for foods with less than ~10g added sugar per serving and less than ~250mg  sodium per serving --              Nutrition Goals Discharge (Final Nutrition Goals Re-Evaluation):  Nutrition Goals Re-Evaluation - 09/19/22 1134       Goals   Nutrition Goal Short: She will continue to eat fruits and vegetables daily, and to read food labels. Long: Continue to follow healthy eating patterns discussed with RD.    Comment Stacie [name] is doing well with her diet.  Her weight is going back down now.  She is getting in more protein and getting in eggs and yogurt for breakfast. She is working on reading her food labels and watching sodium and sugar intake.  Her daughhter is helping her keep on top of her diet.    Expected Outcome Short: Continue to work on adding in protein Long: Continue to work on healthy eating             Psychosocial: Target Goals: Acknowledge presence or absence of significant depression and/or stress, maximize coping skills, provide positive support system. Participant is able to verbalize types and ability to use techniques and skills needed for reducing stress and depression.   Education: Stress, Anxiety, and Depression - Group verbal and visual presentation to define topics covered.  Reviews how body is impacted by stress, anxiety, and depression.  Also discusses healthy ways to reduce stress and to treat/manage anxiety and depression.  Written material given at graduation. Flowsheet Row Pulmonary Rehab from 08/10/2022 in [facility] Cardiac and Pulmonary Rehab  Date 08/10/22   Educator [facility]  Instruction Review Code 1- [employer] Understanding       Education: Sleep Hygiene -Provides group verbal and written instruction about how sleep can affect your health.  Define sleep hygiene, discuss sleep cycles and impact of sleep habits. Review good sleep hygiene tips.  Flowsheet Row Pulmonary Rehab from 08/10/2018 in [facility] Cardiac and Pulmonary Rehab  Date 04/11/18  Educator [facility]  Instruction Review Code 1- Verbalizes Understanding       Initial Review & Psychosocial Screening:   Quality of Life Scores:  Scores of 19 and below  usually indicate a poorer quality of life in these areas.  A difference of  2-3 points is a clinically meaningful difference.  A difference of 2-3 points in the total score of the Quality of Life Index has been associated with significant improvement in overall quality of life, self-image, physical symptoms, and general health in studies assessing change in quality of life.  PHQ-9: Review Flowsheet  More data exists      07/20/2022 06/24/2022 10/13/2021 09/28/2021 09/21/2020  Depression screen PHQ 2/9  Decreased Interest 0 0 0 0 0  Down, Depressed, Hopeless 0 0 0 0 0  PHQ - 2 Score 0 0 0 0 0  Altered sleeping 1 0 0 0 1  Tired, decreased energy 1 0 0 0 1  Change in appetite 0 0 0 0 0  Feeling bad or failure about yourself  0 0 0 0 0  Trouble concentrating 1 0 0 0 0  Moving slowly or fidgety/restless 0 0 0 0 0  Suicidal thoughts 0 0 0 0 0  PHQ-9 Score 3 0 0 0 2  Difficult doing work/chores Not difficult at all Not difficult at all Not difficult at all Not difficult at all Not difficult at all   Interpretation of Total Score  Total Score Depression Severity:  1-4 = Minimal depression, 5-9 = Mild depression, 10-14 = Moderate depression, 15-19 = Moderately severe depression, 20-27 = Severe depression   Psychosocial Evaluation and Intervention:   Psychosocial Re-Evaluation:  Psychosocial Re-Evaluation     Row Name 08/29/22 1120  09/19/22 1129           Psychosocial Re-Evaluation   Current issues with Current Psychotropic Meds;Current Stress Concerns Current Psychotropic Meds;Current Stress Concerns      Comments Stacie [name] denies any major stressors at this time. She states that she has a good support system at home made up mostly by her husband and children. For stress relief she likes to read and study her Bible and play with her grandchildren. She also feels that exercise has been a good stress releiver for her as well. Stacie [name] is doing well in rehab.  She is doing more stairs than she used to so she is not relying on her daughter as much now.  She says her older grandkids helped decorate for Christmas but went over the top, but she was grateful for their help.  She does not have any major stressors currently.  She had a call from credit card to watch her account spending, but they were all legit charges.  She is still sleeping well. She is still taking time for herself for Bible study and spending time with her grandchildren.      Expected Outcomes Short: Continue [name] to help with mood. Long: Maintain a positive outlook. Short; COnitnue to exercise for menatl boost Long: Continue to stay positive      Interventions Relaxation education;Stress management education;Encouraged to attend Pulmonary Rehabilitation for the exercise Relaxation education;Stress management education;Encouraged to attend Pulmonary Rehabilitation for the exercise      Continue Psychosocial Services  Follow up required by staff Follow up required by staff               Psychosocial Discharge (Final Psychosocial Re-Evaluation):  Psychosocial Re-Evaluation - 09/19/22 1129       Psychosocial Re-Evaluation   Current issues with Current Psychotropic Meds;Current Stress Concerns    Comments Stacie [name] is doing well in rehab.  She is doing more stairs than she used to so she  is not relying on her daughter as much now.  She says her older grandkids helped  decorate for Christmas but went over the top, but she was grateful for their help.  She does not have any major stressors currently.  She had a call from credit card to watch her account spending, but they were all legit charges.  She is still sleeping well. She is still taking time for herself for Bible study and spending time with her grandchildren.    Expected Outcomes Short; COnitnue to exercise for menatl boost Long: Continue to stay positive    Interventions Relaxation education;Stress management education;Encouraged to attend Pulmonary Rehabilitation for the exercise    Continue Psychosocial Services  Follow up required by staff             Education: Education Goals: Education classes will be provided on a weekly basis, covering required topics. Participant will state understanding/return demonstration of topics presented.  Learning Barriers/Preferences:   General Pulmonary Education Topics:  Infection Prevention: - Provides verbal and written material to individual with discussion of infection control including proper hand washing and proper equipment cleaning during exercise session. Flowsheet Row Pulmonary Rehab from 08/10/2022 in [facility] Cardiac and Pulmonary Rehab  Date 07/27/22  Educator NT  Instruction Review Code 1- Verbalizes Understanding       Falls Prevention: - Provides verbal and written material to individual with discussion of falls prevention and safety. Flowsheet Row Pulmonary Rehab from 08/10/2022 in [facility] Cardiac and Pulmonary Rehab  Date 07/27/22  Educator NT  Instruction Review Code 1- Verbalizes Understanding       Chronic Lung Disease Review: - Group verbal instruction with posters, models, PowerPoint presentations and videos,  to review new updates, new respiratory medications, new advancements in procedures and treatments. Providing information on websites and "800" numbers for continued self-education. Includes information about supplement  oxygen, available portable oxygen systems, continuous and intermittent flow rates, oxygen safety, concentrators, and Medicare reimbursement for oxygen. Explanation of Pulmonary Drugs, including class, frequency, complications, importance of spacers, rinsing mouth after steroid MDI's, and proper cleaning methods for nebulizers. Review of basic lung anatomy and physiology related to function, structure, and complications of lung disease. Review of risk factors. Discussion about methods for diagnosing sleep apnea and types of masks and machines for OSA. Includes a review of the use of types of environmental controls: home humidity, furnaces, filters, dust mite/pet prevention, HEPA vacuums. Discussion about weather changes, air quality and the benefits of nasal washing. Instruction on Warning signs, infection symptoms, calling MD promptly, preventive modes, and value of vaccinations. Review of effective airway clearance, coughing and/or vibration techniques. Emphasizing that all should Create an Action Plan. Written material given at graduation. Flowsheet Row Pulmonary Rehab from 08/10/2018 in [facility] Cardiac and Pulmonary Rehab  Date 06/29/18  Educator [facility]  Instruction Review Code 1- Verbalizes Understanding       AED/CPR: - Group verbal and written instruction with the use of models to demonstrate the basic use of the AED with the basic ABC's of resuscitation.    Anatomy and Cardiac Procedures: - Group verbal and visual presentation and models provide information about basic cardiac anatomy and function. Reviews the testing methods done to diagnose heart disease and the outcomes of the test results. Describes the treatment choices: Medical Management, Angioplasty, or Coronary Bypass Surgery for treating various heart conditions including Myocardial Infarction, Angina, Valve Disease, and Cardiac Arrhythmias.  Written material given at graduation. Flowsheet Row Pulmonary Rehab from 08/10/2018 in [facility] Cardiac  and Pulmonary Rehab  Date 05/16/18  Educator [facility]  Instruction Review Code 5- Refused Teaching       Medication Safety: - Group verbal and visual instruction to review commonly prescribed medications for heart and lung disease. Reviews the medication, class of the drug, and side effects. Includes the steps to properly store meds and maintain the prescription regimen.  Written material given at graduation. Flowsheet Row Pulmonary Rehab from 08/10/2018 in [facility] Cardiac and Pulmonary Rehab  Date 08/10/18  Educator [facility]  Instruction Review Code 1- Verbalizes Understanding       Other: -Provides group and verbal instruction on various topics (see comments)   Knowledge Questionnaire Score:  Knowledge Questionnaire Score - 08/15/22 1642       Knowledge Questionnaire Score   Pre Score 11/18              Core Components/Risk Factors/Patient Goals at Admission:   Education:Diabetes - Individual verbal and written instruction to review signs/symptoms of diabetes, desired ranges of glucose level fasting, after meals and with exercise. Acknowledge that pre and post exercise glucose checks will be done for 3 sessions at entry of program.   Know Your Numbers and Heart Failure: - Group verbal and visual instruction to discuss disease risk factors for cardiac and pulmonary disease and treatment options.  Reviews associated critical values for Overweight/Obesity, Hypertension, Cholesterol, and Diabetes.  Discusses basics of heart failure: signs/symptoms and treatments.  Introduces Heart Failure Zone chart for action plan for heart failure.  Written material given at graduation. Flowsheet Row Pulmonary Rehab from 08/10/2022 in [facility] Cardiac and Pulmonary Rehab  Date 07/27/22  Educator SB  Instruction Review Code 1- Verbalizes Understanding       Core Components/Risk Factors/Patient Goals Review:   Goals and Risk Factor Review     Row Name 08/08/22 1114 08/29/22 1125 09/19/22 1137          Core Components/Risk Factors/Patient Goals Review   Personal Goals Review Improve shortness of breath with ADL's Improve shortness of breath with ADL's;Weight Management/Obesity Improve shortness of breath with ADL's;Weight Management/Obesity;Increase knowledge of respiratory medications and ability to use respiratory devices properly.     Review Spoke to patient about their shortness of breath and what they can do to improve. Patient has been informed of breathing techniques when starting the program. Patient is informed to tell staff if they have had any med changes and that certain meds they are taking or not taking can be causing shortness of breath. Stacie [name] states that she would like to lose a little bit of weight with a goal of 165-170 lbs. She also has been monitoring her O2 saturations at home and reports that they are staying above 95. She also states that she has experienced very little SOB with her ADL's. Stacie [name] has not been checking her BP at home but does have a BP cuff and was encouraged to start monitoring it at home. Stacie [name] is doing well in rehab.  Her weight is starting to come back down.  Her breathing is doing better, she is able to get up and down more and doing more going up the stairs and needing less help from her daughter. She is doing well on her meds.  She has only been using her nebulizer when she needs it but hasn't needed it much.  She had esphogeal spasms recently and has found that peppermint oil helps relieve her symptoms.  She has linked it to either pepper or noddle/rice getting caught.  Expected Outcomes Short: Attend [name] regularly to improve shortness of breath with ADL's. Long: maintain independence with ADL's Short: Attend [name] regularly to improve shortness of breath with ADL's. Long: Continue to monitor lifestyle risk factors. Short; COntinue to work on weight loss lOng: conitnue to work on breathing              Core Components/Risk Factors/Patient  Goals at Discharge (Final Review):   Goals and Risk Factor Review - 09/19/22 1137       Core Components/Risk Factors/Patient Goals Review   Personal Goals Review Improve shortness of breath with ADL's;Weight Management/Obesity;Increase knowledge of respiratory medications and ability to use respiratory devices properly.    Review Stacie [name] is doing well in rehab.  Her weight is starting to come back down.  Her breathing is doing better, she is able to get up and down more and doing more going up the stairs and needing less help from her daughter. She is doing well on her meds.  She has only been using her nebulizer when she needs it but hasn't needed it much.  She had esphogeal spasms recently and has found that peppermint oil helps relieve her symptoms.  She has linked it to either pepper or noddle/rice getting caught.    Expected Outcomes Short; COntinue to work on weight loss lOng: conitnue to work on breathing             ITP Comments:  ITP Comments     Row Name 08/17/22 0942 09/14/22 0810 10/12/22 1023 10/13/22 1444 11/09/22 0942   ITP Comments 30 Day review completed. Medical Director ITP review done, changes made as directed, and signed approval by Medical Director.   NEW TO PROGRAM 30 Day review completed. Medical Director ITP review done, changes made as directed, and signed approval by Medical Director. 30 Day review completed. Medical Director ITP review done, changes made as directed, and signed approval by Medical Director. Called to check on patient.  She has been out sick.    It has gone through the whole family.  She is feeling better and hopes to return soon. 30 Day review completed. Medical Director ITP review done, changes made as directed, and signed approval by Medical Director.   remains out    Row Name 11/14/22 1617 12/07/22 1304 01/04/23 0827 01/12/23 1335 01/19/23 1628   ITP Comments Called to check on patient.  Last attended on 10/26/22.  She saw her pulmonologist and they  think something may be going on with her heart and requested that she hold off until meeting with cardiology next week and is scheduled for a cath possibly. She is now on hold until we hear from cardiology. 30 day review completed. ITP sent to Dr. Jinny Sanders, Medical Director of  Pulmonary Rehab. Continue with ITP unless changes are made by physician.  Pt currently out on medical hold for heart workup. Pt is scheduled for cath next month. 30 Day review completed. Medical Director ITP review done, changes made as directed, and signed approval by Medical Director. Left voicemail for patient to follow up, left message. Has not attended since 12/16 since being placed on medical hold. If no response back, will send letter at that time. --    Row Name 02/01/23 1153           ITP Comments 30 day review completed. ITP sent to Dr. Jinny Sanders, Medical Director of  Pulmonary Rehab. Continue with ITP unless changes are made by physician.  Pt out  on medical hold, no goal progress.                Comments: 30 day review

## 2023-02-07 ENCOUNTER — Ambulatory Visit: Payer: BC Managed Care – PPO | Admitting: Medical

## 2023-02-16 ENCOUNTER — Encounter: Payer: Self-pay | Admitting: *Deleted

## 2023-02-16 DIAGNOSIS — D860 Sarcoidosis of lung: Secondary | ICD-10-CM

## 2023-02-16 NOTE — Progress Notes (Signed)
Discharge Summary: Stacie [name] [birth year]   [name] discharged today from  rehab with 16 sessions completed.  She had not attended since January due to cardiac work up. She was encouraged to get a new referral to return once she is able to attend rehab regularly.

## 2023-02-16 NOTE — Progress Notes (Signed)
Pulmonary Individual Treatment Plan  Patient Details  Name: Stacie [name] MRN: [id number] Date of Birth: 03-19-1956 Referring Provider:   Flowsheet Row Pulmonary Rehab from 07/25/2022 in [facility] Cardiac and Pulmonary Rehab  Referring Provider Ottie Glazier, MD       Initial Encounter Date:  Flowsheet Row Pulmonary Rehab from 07/25/2022 in [facility] Cardiac and Pulmonary Rehab  Date 07/25/22       Visit Diagnosis: Sarcoidosis of lung ([place])  Patient's Home Medications on Admission:  Current Outpatient Medications:    acetaminophen (TYLENOL) 500 MG tablet, Take 500 mg by mouth every 6 (six) hours as needed for moderate pain or headache., Disp: , Rfl:    albuterol (ACCUNEB) 1.25 MG/3ML nebulizer solution, Take 1 ampule by nebulization as needed., Disp: , Rfl:    azithromycin (ZITHROMAX) 250 MG tablet, Take 250 mg by mouth 3 (three) times a week., Disp: , Rfl:    budesonide (PULMICORT) 0.25 MG/2ML nebulizer solution, Take 0.25 mg by nebulization daily as needed., Disp: , Rfl:    denosumab (PROLIA) 60 MG/ML SOSY injection, Inject 60 mg into the skin every 6 (six) months., Disp: , Rfl:    dorzolamide (TRUSOPT) 2 % ophthalmic solution, Place 1 drop into both eyes 2 (two) times daily., Disp: , Rfl:    escitalopram (LEXAPRO) 20 MG tablet, Take 1 tablet (20 mg total) by mouth daily., Disp: 90 tablet, Rfl: 3   Fluticasone-Umeclidin-Vilant (TRELEGY ELLIPTA) 200-62.5-25 MCG/ACT AEPB, Inhale into the lungs daily., Disp: , Rfl:    latanoprost (XALATAN) 0.005 % ophthalmic solution, Place 1 drop into the right eye 2 (two) times daily., Disp: , Rfl:    levothyroxine (SYNTHROID) 137 MCG tablet, Take 1 tablet (137 mcg total) by mouth daily before breakfast., Disp: 90 tablet, Rfl: 3   Multiple Vitamins-Minerals (MULTIVITAMIN WITH MINERALS) tablet, Take 1 tablet by mouth daily., Disp: , Rfl:    naproxen sodium (ALEVE) 220 MG tablet, Take 220 mg by mouth daily as needed (pain)., Disp: , Rfl:    NON FORMULARY,  CPAP nightly, Disp: , Rfl:    OXYGEN, Inhale 2 L into the lungs daily as needed (During walking and activity). , Disp: , Rfl:    predniSONE (DELTASONE) 10 MG tablet, Take 2 tablets (20 mg total) by mouth daily. Resume only after the finishing new Rx for prednisone (Patient taking differently: Take 15 mg by mouth daily.), Disp: , Rfl:    Probiotic Product (PROBIOTIC PO), Take 1 capsule by mouth daily., Disp: , Rfl:    Treprostinil (TYVASO) 0.6 MG/ML SOLN, Inhale into the lungs See admin instructions. 12 breaths 4 times a day, Disp: , Rfl:   Past Medical History: Past Medical History:  Diagnosis Date   Acute renal failure ([place])    Acute respiratory failure ([place]) 10/23/2021   Acute respiratory failure with hypoxia (HCC)    ARF (acute respiratory failure) ([place]) 10/19/2021   Arrhythmia    patient unaware if this is current   Asthma    Chronic kidney disease    Critical lower limb ischemia (HCC)    Depression    GERD (gastroesophageal reflux disease)    Heart murmur    History of kidney stones    HOH (hard of hearing)    wear aids   Hyperthyroidism    Hypothyroidism    IBS (irritable bowel syndrome)    Pneumonia    Pulmonary hypertension ([place])    Sarcoid    Sarcoidosis    Seasonal allergies    Sleep apnea CPAP  with O2   Stroke (HCC) 07/2020   watershed   Wears hearing aid in both ears     Tobacco Use: Social History   Tobacco Use  Smoking Status Never  Smokeless Tobacco Never    Labs: Review Flowsheet  More data exists      Latest Ref Rng & Units 07/21/2020 01/14/2021 10/19/2021 06/24/2022 12/23/2022  Labs for ITP Cardiac and Pulmonary Rehab  Cholestrol 0 - 200 mg/dL 161  - - - -  LDL (calc) 0 - 99 mg/dL 096  - - - -  HDL-C >04 mg/dL 33  - - - -  Trlycerides <150 mg/dL 540  - - - -  Hemoglobin A1c 4.8 - 5.6 % - - - 6.1  -  PH, Arterial 7.35 - 7.45 - - - - 7.413   PCO2 arterial 32 - 48 mmHg - - - - 44.3   Bicarbonate 20.0 - 28.0 mmol/L - - 31.7  - 28.3  30.0   TCO2 22 - 32  mmol/L - 30  - - 30  32   O2 Saturation % - - 33.6  - 95  65      Pulmonary Assessment Scores:   UCSD: Self-administered rating of dyspnea associated with activities of daily living (ADLs) 6-point scale (0 = "not at all" to 5 = "maximal or unable to do because of breathlessness")  Scoring Scores range from 0 to 120.  Minimally important difference is 5 units  CAT: CAT can identify the health impairment of COPD patients and is better correlated with disease progression.  CAT has a scoring range of zero to 40. The CAT score is classified into four groups of low (less than 10), medium (10 - 20), high (21-30) and very high (31-40) based on the impact level of disease on health status. A CAT score over 10 suggests significant symptoms.  A worsening CAT score could be explained by an exacerbation, poor medication adherence, poor inhaler technique, or progression of COPD or comorbid conditions.  CAT MCID is 2 points  mMRC: mMRC (Modified Medical Research Council) Dyspnea Scale is used to assess the degree of baseline functional disability in patients of respiratory disease due to dyspnea. No minimal important difference is established. A decrease in score of 1 point or greater is considered a positive change.   Pulmonary Function Assessment:   Exercise Target Goals: Exercise Program Goal: Individual exercise prescription set using results from initial 6 min walk test and THRR while considering  patient's activity barriers and safety.   Exercise Prescription Goal: Initial exercise prescription builds to 30-45 minutes a day of aerobic activity, 2-3 days per week.  Home exercise guidelines will be given to patient during program as part of exercise prescription that the participant will acknowledge.  Education: Aerobic Exercise: - Group verbal and visual presentation on the components of exercise prescription. Introduces F.I.T.T principle from ACSM for exercise prescriptions.  Reviews F.I.T.T.  principles of aerobic exercise including progression. Written material given at graduation. Flowsheet Row Pulmonary Rehab from 08/10/2018 in [facility] Cardiac and Pulmonary Rehab  Date 05/02/18  Educator [facility]  Instruction Review Code 1- Verbalizes Understanding       Education: Resistance Exercise: - Group verbal and visual presentation on the components of exercise prescription. Introduces F.I.T.T principle from ACSM for exercise prescriptions  Reviews F.I.T.T. principles of resistance exercise including progression. Written material given at graduation.    Education: Exercise & Equipment Safety: - Individual verbal instruction and demonstration of equipment use and  safety with use of the equipment. Flowsheet Row Pulmonary Rehab from 08/10/2022 in [facility] Cardiac and Pulmonary Rehab  Date 07/25/22  Educator NT  Instruction Review Code 1- Verbalizes Understanding       Education: Exercise Physiology & General Exercise Guidelines: - Group verbal and written instruction with models to review the exercise physiology of the cardiovascular system and associated critical values. Provides general exercise guidelines with specific guidelines to those with heart or lung disease.  Flowsheet Row Pulmonary Rehab from 08/10/2018 in [facility] Cardiac and Pulmonary Rehab  Date 07/11/18  Educator [facility]  Instruction Review Code 1- Verbalizes Understanding       Education: Flexibility, Balance, Mind/Body Relaxation: - Group verbal and visual presentation with interactive activity on the components of exercise prescription. Introduces F.I.T.T principle from ACSM for exercise prescriptions. Reviews F.I.T.T. principles of flexibility and balance exercise training including progression. Also discusses the mind body connection.  Reviews various relaxation techniques to help reduce and manage stress (i.e. Deep breathing, progressive muscle relaxation, and visualization). Balance handout provided to take home. Written  material given at graduation. Flowsheet Row Pulmonary Rehab from 08/10/2018 in [facility] Cardiac and Pulmonary Rehab  Date 08/08/18  Educator AS  Instruction Review Code 1- Verbalizes Understanding       Activity Barriers & Risk Stratification:   6 Minute Walk:  Oxygen Initial Assessment:   Oxygen Re-Evaluation:  Oxygen Re-Evaluation     Row Name 08/29/22 1131 09/19/22 1141           Program Oxygen Prescription   Program Oxygen Prescription Continuous Continuous      Liters per minute 2 2        Home Oxygen   Home Oxygen Device Portable Concentrator;Home Concentrator Portable Concentrator;Home Concentrator      Sleep Oxygen Prescription CPAP CPAP      Liters per minute 2 2      Home Exercise Oxygen Prescription Continuous Continuous      Liters per minute 2 2      Home Resting Oxygen Prescription Continuous Continuous      Liters per minute 2 2      Compliance with Home Oxygen Use Yes Yes        Goals/Expected Outcomes   Short Term Goals To learn and exhibit compliance with exercise, home and travel O2 prescription;To learn and understand importance of monitoring SPO2 with pulse oximeter and demonstrate accurate use of the pulse oximeter.;To learn and understand importance of maintaining oxygen saturations>88%;To learn and demonstrate proper pursed lip breathing techniques or other breathing techniques. ;To learn and demonstrate proper use of respiratory medications To learn and exhibit compliance with exercise, home and travel O2 prescription;To learn and understand importance of monitoring SPO2 with pulse oximeter and demonstrate accurate use of the pulse oximeter.;To learn and understand importance of maintaining oxygen saturations>88%;To learn and demonstrate proper pursed lip breathing techniques or other breathing techniques. ;To learn and demonstrate proper use of respiratory medications      Long  Term Goals Exhibits compliance with exercise, home  and travel O2  prescription;Verbalizes importance of monitoring SPO2 with pulse oximeter and return demonstration;Maintenance of O2 saturations>88%;Exhibits proper breathing techniques, such as pursed lip breathing or other method taught during program session;Compliance with respiratory medication;Demonstrates proper use of MDI's Exhibits compliance with exercise, home  and travel O2 prescription;Verbalizes importance of monitoring SPO2 with pulse oximeter and return demonstration;Maintenance of O2 saturations>88%;Exhibits proper breathing techniques, such as pursed lip breathing or other method taught during program session;Compliance with respiratory  medication;Demonstrates proper use of MDI's      Comments Reviewed PLB technique with pt.  Talked about how it works and it's importance in maintaining their exercise saturations. Stacie [name] has a pulse oximeter at home and has been checking her O2 saturations. When she walks at home she reports that she does experience desaturation but she pauses to let her oxygen come back to normal levels. Stacie [name] is doing well in rehab.  She is doing well on her oxgygen at home and saturations are staying up.  She is doing well with her CPAP but has started a new mask type that is not fitting as well.  She may go back to other form.  She is feeling good with her breathing and using her PLB consistently.      Goals/Expected Outcomes Short: Become more profiecient at using PLB.   Long: Become independent at using PLB. Short: Check on CPAP mask again Long: Conitnue to use PLB               Oxygen Discharge (Final Oxygen Re-Evaluation):  Oxygen Re-Evaluation - 09/19/22 1141       Program Oxygen Prescription   Program Oxygen Prescription Continuous    Liters per minute 2      Home Oxygen   Home Oxygen Device Portable Concentrator;Home Concentrator    Sleep Oxygen Prescription CPAP    Liters per minute 2    Home Exercise Oxygen Prescription Continuous    Liters per minute 2    Home  Resting Oxygen Prescription Continuous    Liters per minute 2    Compliance with Home Oxygen Use Yes      Goals/Expected Outcomes   Short Term Goals To learn and exhibit compliance with exercise, home and travel O2 prescription;To learn and understand importance of monitoring SPO2 with pulse oximeter and demonstrate accurate use of the pulse oximeter.;To learn and understand importance of maintaining oxygen saturations>88%;To learn and demonstrate proper pursed lip breathing techniques or other breathing techniques. ;To learn and demonstrate proper use of respiratory medications    Long  Term Goals Exhibits compliance with exercise, home  and travel O2 prescription;Verbalizes importance of monitoring SPO2 with pulse oximeter and return demonstration;Maintenance of O2 saturations>88%;Exhibits proper breathing techniques, such as pursed lip breathing or other method taught during program session;Compliance with respiratory medication;Demonstrates proper use of MDI's    Comments Stacie [name] is doing well in rehab.  She is doing well on her oxgygen at home and saturations are staying up.  She is doing well with her CPAP but has started a new mask type that is not fitting as well.  She may go back to other form.  She is feeling good with her breathing and using her PLB consistently.    Goals/Expected Outcomes Short: Check on CPAP mask again Long: Conitnue to use PLB             Initial Exercise Prescription:   Perform Capillary Blood Glucose checks as needed.  Exercise Prescription Changes:   Exercise Prescription Changes     Row Name 08/22/22 1100 09/20/22 1500 10/04/22 1500 11/01/22 0800       Response to Exercise   Blood Pressure (Admit) 130/70 132/74 140/70 140/68    Blood Pressure (Exercise) 138/62 128/72 -- --    Blood Pressure (Exit) 134/72 122/64 124/70 132/62    Heart Rate (Admit) 96 bpm 103 bpm 107 bpm 104 bpm    Heart Rate (Exercise) 119 bpm 121 bpm 119 bpm 110 bpm  Heart Rate  (Exit) 104 bpm 105 bpm 116 bpm 101 bpm    Oxygen Saturation (Admit) 95 % 97 % 94 % 90 %    Oxygen Saturation (Exercise) 91 % 89 % 89 % 93 %    Oxygen Saturation (Exit) 95 % 95 % 95 % 93 %    Rating of Perceived Exertion (Exercise) 13 13 12 14     Perceived Dyspnea (Exercise) 4 3 3 2     Symptoms SOB SOB SOB SOB    Duration Continue with 30 min of aerobic exercise without signs/symptoms of physical distress. Continue with 30 min of aerobic exercise without signs/symptoms of physical distress. Continue with 30 min of aerobic exercise without signs/symptoms of physical distress. Continue with 30 min of aerobic exercise without signs/symptoms of physical distress.    Intensity THRR unchanged THRR unchanged THRR unchanged THRR unchanged      Progression   Progression Continue to progress workloads to maintain intensity without signs/symptoms of physical distress. Continue to progress workloads to maintain intensity without signs/symptoms of physical distress. Continue to progress workloads to maintain intensity without signs/symptoms of physical distress. Continue to progress workloads to maintain intensity without signs/symptoms of physical distress.    Average METs 2.07 2.03 2.28 1.8      Resistance Training   Training Prescription Yes Yes Yes Yes    Weight 3 lb 3 lb 3 lb 3 lb    Reps 10-15 10-15 10-15 10-15      Interval Training   Interval Training No No No No      Oxygen   Oxygen Continuous Continuous Continuous Continuous    Liters 3 3-4 4 4       Treadmill   MPH 1.2 1 1.2 --    Grade 0 0 0 --    Minutes 15 15 15  --    METs 1.92 1.8 1.92 --      Recumbant Bike   Level -- 1.2 -- --    Watts -- 15 -- --    Minutes -- 15 -- --    METs -- 2.54 -- --      NuStep   Level 1 4 3 1     Minutes 15 15 15 15     METs 2 1.7 2 1.5      REL-XR   Level 1 1 1 1     Minutes 15 15 15 15     METs 2.9 2.4 3.2 2.1      T5 Nustep   Level 1 -- -- --    Minutes 15 -- -- --    METs 2 -- -- --       Biostep-RELP   Level -- -- 1 --    Minutes -- -- 15 --    METs -- -- 2 --      Oxygen   Maintain Oxygen Saturation 88% or higher 88% or higher 88% or higher 88% or higher             Exercise Comments:   Exercise Goals and Review:   Exercise Goals Re-Evaluation :  Exercise Goals Re-Evaluation     Row Name 08/22/22 1200 08/29/22 1115 09/19/22 1127 09/20/22 1503 10/04/22 1505     Exercise Goal Re-Evaluation   Exercise Goals Review Understanding of Exercise Prescription;Increase Physical Activity;Increase Strength and Stamina Understanding of Exercise Prescription;Increase Physical Activity;Increase Strength and Stamina Understanding of Exercise Prescription;Increase Physical Activity;Increase Strength and Stamina Understanding of Exercise Prescription;Increase Physical Activity;Increase Strength and Stamina Understanding of Exercise Prescription;Increase Physical  Activity;Increase Strength and Stamina   Comments Stacie [name] continues to do well in rehab. She was able to get her speed on the treadmill to 1.2 mph, though walking is challenging for her. She has not increased her level on other seated machines but all are reflecting appropriate RPEs. She should work toward increasing to level 2 on the T4 Nustep. Oxygen saturations are staying above 88%. Will continue to monitor. Stacie [name] is doing well in rehab. She states that she has more energy since starting the program. She has been walking on the treadmill at a speed of 1.2 mph, but has had some trouble with her oxygen saturation dropping below 88. Stacie [name] has been doing some walking at home  about 2-3 times a week as well, and feels she has seen some improvement there as well. We will continue to monitor her progress in the program. Stacie [name] is doing well in rehab.  She feels good with her exercise in class.  She is walking on deck at home on her off days.  She is also using her pedal machine at home.  She has been doing her stairs more at home.  She  is feeling stronger overall and feeling better. Stacie [name] has been having intermittent attendance due to being sick. Once returned, she was able to increase to level 4 on the T4 Nustep. She would benefit from increasing her level on the XR. Perhaps we can also add a small incline to her TM as her speed has been consistent. Oxygen saturations are staying above 88%. Will continue to monitor. Stacie [name] is doing well in the program. She recently increased her overall average MET level of 2.28 METs. She also was able to increase her speed back up to 1.2 mph on the treadmill. She also has kept her oxygen saturations above 88% since she began using 3L of O2 for exercise. We will continue to monitor her progress in the program.   Expected Outcomes Short: Increase to level 2 on T4 Nustep. Long: Continue to increase overall MET level Short: Continue to walk at home on days away from rehab. Long: Continue to increase strength and stamina. Short: Conitnue to add in more walking at home Long: conitnue to improve stamina. Short: Increase level on XR Long: Continue to increase overall MET level Short: Increase level on XR. Long: Continue to improve strength and stamina.    Row Name 10/19/22 1124 11/01/22 0852 11/15/22 1238 12/12/22 1547 12/27/22 1501     Exercise Goal Re-Evaluation   Exercise Goals Review Understanding of Exercise Prescription;Increase Physical Activity;Increase Strength and Stamina Understanding of Exercise Prescription;Increase Physical Activity;Increase Strength and Stamina -- -- Understanding of Exercise Prescription;Increase Physical Activity;Increase Strength and Stamina   Comments Stacie [name] has not attended since last review. She has been out sick and staff will continue to follow up with patient on when she is feeling better and able to come back. Stacie [name] has only attended rehab once since the last review. She returned from being sick and her workloads on both the T4 and XR were both down to level 1. She also was  not able to do any walking on the treadmill during her first session back in rehab. We will continue to monitor her progress as she returns to consistent attendance. Out since last review Stacie [name] has not been here since last review. She has a scheduled heart cath for 3/8 and we will wait to hear back from her doctor on results on next steps. Patient is currently on a medical hold.  Stacie [name] has not been here since last review. She had a heart cath on 3/8. We attempted to call patient to follow up but had to leave a message. We will wait to hear back from her doctor on results and next steps. Patient is currently on a medical hold.   Expected Outcomes Short: Return to rehab and maintain good attendance Long: Graduate from [employer] Short: Increase workloads back up to the levels prior to sickness. Long: Continue to improve strength and stamina. -- Short: Wait to hear back on test results, get clearance to return Long: Graduate from [employer] Short: Wait to hear back on test results, get clearance to return. Long: Graduate from [employer].    Row Name 01/10/23 1506 01/23/23 1412 02/07/23 1537         Exercise Goal Re-Evaluation   Exercise Goals Review Understanding of Exercise Prescription;Increase Physical Activity;Increase Strength and Stamina Understanding of Exercise Prescription;Increase Physical Activity;Increase Strength and Stamina Understanding of Exercise Prescription;Increase Physical Activity;Increase Strength and Stamina     Comments Stacie [name] has yet to return to rehab. Last day attended was on 1/15. She had been placed on a medical hold and had a heart cath completed. Will update with patient to see how she is doing, get clearance to return. Possible discharge if no response from patient. We recently called patient to follow up and left message. Has not attended since 12/16 since being placed on medical hold. If no response back, will send letter at that time. Staff sent letter to patient as we  have not heard back from patient.     Expected Outcomes Short: Return to rehab when cleared Long: Graduate from [employer]: Return to rehab when cleared Long: Graduate from [employer]: Return to rehab when cleared Long: Graduate from [employer]              Discharge Exercise Prescription (Final Exercise Prescription Changes):  Exercise Prescription Changes - 11/01/22 0800       Response to Exercise   Blood Pressure (Admit) 140/68    Blood Pressure (Exit) 132/62    Heart Rate (Admit) 104 bpm    Heart Rate (Exercise) 110 bpm    Heart Rate (Exit) 101 bpm    Oxygen Saturation (Admit) 90 %    Oxygen Saturation (Exercise) 93 %    Oxygen Saturation (Exit) 93 %    Rating of Perceived Exertion (Exercise) 14    Perceived Dyspnea (Exercise) 2    Symptoms SOB    Duration Continue with 30 min of aerobic exercise without signs/symptoms of physical distress.    Intensity THRR unchanged      Progression   Progression Continue to progress workloads to maintain intensity without signs/symptoms of physical distress.    Average METs 1.8      Resistance Training   Training Prescription Yes    Weight 3 lb    Reps 10-15      Interval Training   Interval Training No      Oxygen   Oxygen Continuous    Liters 4      NuStep   Level 1    Minutes 15    METs 1.5      REL-XR   Level 1    Minutes 15    METs 2.1      Oxygen   Maintain Oxygen Saturation 88% or higher             Nutrition:  Target Goals: Understanding of nutrition guidelines, daily intake of  sodium 1500mg , cholesterol 200mg , calories 30% from fat and 7% or less from saturated fats, daily to have 5 or more servings of fruits and vegetables.  Education: All About Nutrition: -Group instruction provided by verbal, written material, interactive activities, discussions, models, and posters to present general guidelines for heart healthy nutrition including fat, fiber, MyPlate, the role of sodium in heart  healthy nutrition, utilization of the nutrition label, and utilization of this knowledge for meal planning. Follow up email sent as well. Written material given at graduation. Flowsheet Row Pulmonary Rehab from 08/10/2018 in [facility] Cardiac and Pulmonary Rehab  Date 06/27/18  Educator [facility]  Instruction Review Code 1- Verbalizes Understanding       Biometrics:    Nutrition Therapy Plan and Nutrition Goals:   Nutrition Assessments:  MEDIFICTS Score Key: ?70 Need to make dietary changes  40-70 Heart Healthy Diet ? 40 Therapeutic Level Cholesterol Diet  Flowsheet Row Pulmonary Rehab from 08/15/2022 in [facility] Cardiac and Pulmonary Rehab  Picture Your Plate Total Score on Admission 58      Picture Your Plate Scores: <16 Unhealthy dietary pattern with much room for improvement. 41-50 Dietary pattern unlikely to meet recommendations for good health and room for improvement. 51-60 More healthful dietary pattern, with some room for improvement.  >60 Healthy dietary pattern, although there may be some specific behaviors that could be improved.   Nutrition Goals Re-Evaluation:  Nutrition Goals Re-Evaluation     Row Name 08/29/22 1124 09/19/22 1134           Goals   Nutrition Goal -- Short: She will continue to eat fruits and vegetables daily, and to read food labels. Long: Continue to follow healthy eating patterns discussed with RD.      Comment -- Stacie [name] is doing well with her diet.  Her weight is going back down now.  She is getting in more protein and getting in eggs and yogurt for breakfast. She is working on reading her food labels and watching sodium and sugar intake.  Her daughhter is helping her keep on top of her diet.      Expected Outcome Short: She will continue to eat fruits and vegetables daily, and to read food labels. Long: Continue to follow healthy eating patterns discussed with RD. Short: Continue to work on adding in protein Long: Continue to work on healthy eating         Personal Goal #2 Re-Evaluation   Personal Goal #2 Continue to educate yourself on foods high in sodium and added sugar. Look for foods with less than ~10g added sugar per serving and less than ~250mg  sodium per serving --               Nutrition Goals Discharge (Final Nutrition Goals Re-Evaluation):  Nutrition Goals Re-Evaluation - 09/19/22 1134       Goals   Nutrition Goal Short: She will continue to eat fruits and vegetables daily, and to read food labels. Long: Continue to follow healthy eating patterns discussed with RD.    Comment Stacie [name] is doing well with her diet.  Her weight is going back down now.  She is getting in more protein and getting in eggs and yogurt for breakfast. She is working on reading her food labels and watching sodium and sugar intake.  Her daughhter is helping her keep on top of her diet.    Expected Outcome Short: Continue to work on adding in protein Long: Continue to work on [employer]  Psychosocial: Target Goals: Acknowledge presence or absence of significant depression and/or stress, maximize coping skills, provide positive support system. Participant is able to verbalize types and ability to use techniques and skills needed for reducing stress and depression.   Education: Stress, Anxiety, and Depression - Group verbal and visual presentation to define topics covered.  Reviews how body is impacted by stress, anxiety, and depression.  Also discusses healthy ways to reduce stress and to treat/manage anxiety and depression.  Written material given at graduation. Flowsheet Row Pulmonary Rehab from 08/10/2022 in [facility] Cardiac and Pulmonary Rehab  Date 08/10/22  Educator [facility]  Instruction Review Code 1- [employer] Understanding       Education: Sleep Hygiene -Provides group verbal and written instruction about how sleep can affect your health.  Define sleep hygiene, discuss sleep cycles and impact of sleep habits. Review good sleep hygiene  tips.  Flowsheet Row Pulmonary Rehab from 08/10/2018 in [facility] Cardiac and Pulmonary Rehab  Date 04/11/18  Educator [facility]  Instruction Review Code 1- Verbalizes Understanding       Initial Review & Psychosocial Screening:   Quality of Life Scores:  Scores of 19 and below usually indicate a poorer quality of life in these areas.  A difference of  2-3 points is a clinically meaningful difference.  A difference of 2-3 points in the total score of the Quality of Life Index has been associated with significant improvement in overall quality of life, self-image, physical symptoms, and general health in studies assessing change in quality of life.  PHQ-9: Review Flowsheet  More data exists      07/20/2022 06/24/2022 10/13/2021 09/28/2021 09/21/2020  Depression screen PHQ 2/9  Decreased Interest 0 0 0 0 0  Down, Depressed, Hopeless 0 0 0 0 0  PHQ - 2 Score 0 0 0 0 0  Altered sleeping 1 0 0 0 1  Tired, decreased energy 1 0 0 0 1  Change in appetite 0 0 0 0 0  Feeling bad or failure about yourself  0 0 0 0 0  Trouble concentrating 1 0 0 0 0  Moving slowly or fidgety/restless 0 0 0 0 0  Suicidal thoughts 0 0 0 0 0  PHQ-9 Score 3 0 0 0 2  Difficult doing work/chores Not difficult at all Not difficult at all Not difficult at all Not difficult at all Not difficult at all   Interpretation of Total Score  Total Score Depression Severity:  1-4 = Minimal depression, 5-9 = Mild depression, 10-14 = Moderate depression, 15-19 = Moderately severe depression, 20-27 = Severe depression   Psychosocial Evaluation and Intervention:   Psychosocial Re-Evaluation:  Psychosocial Re-Evaluation     Row Name 08/29/22 1120 09/19/22 1129           Psychosocial Re-Evaluation   Current issues with Current Psychotropic Meds;Current Stress Concerns Current Psychotropic Meds;Current Stress Concerns      Comments Stacie [name] denies any major stressors at this time. She states that she has a good support system at home  made up mostly by her husband and children. For stress relief she likes to read and study her Bible and play with her grandchildren. She also feels that exercise has been a good stress releiver for her as well. Stacie [name] is doing well in rehab.  She is doing more stairs than she used to so she is not relying on her daughter as much now.  She says her older grandkids helped decorate for Christmas but went over the top,  but she was grateful for their help.  She does not have any major stressors currently.  She had a call from credit card to watch her account spending, but they were all legit charges.  She is still sleeping well. She is still taking time for herself for Bible study and spending time with her grandchildren.      Expected Outcomes Short: Continue [name] to help with mood. Long: Maintain a positive outlook. Short; COnitnue to exercise for menatl boost Long: Continue to stay positive      Interventions Relaxation education;Stress management education;Encouraged to attend Pulmonary Rehabilitation for the exercise Relaxation education;Stress management education;Encouraged to attend Pulmonary Rehabilitation for the exercise      Continue Psychosocial Services  Follow up required by staff Follow up required by staff               Psychosocial Discharge (Final Psychosocial Re-Evaluation):  Psychosocial Re-Evaluation - 09/19/22 1129       Psychosocial Re-Evaluation   Current issues with Current Psychotropic Meds;Current Stress Concerns    Comments Stacie [name] is doing well in rehab.  She is doing more stairs than she used to so she is not relying on her daughter as much now.  She says her older grandkids helped decorate for Christmas but went over the top, but she was grateful for their help.  She does not have any major stressors currently.  She had a call from credit card to watch her account spending, but they were all legit charges.  She is still sleeping well. She is still taking time for herself  for Bible study and spending time with her grandchildren.    Expected Outcomes Short; COnitnue to exercise for menatl boost Long: Continue to stay positive    Interventions Relaxation education;Stress management education;Encouraged to attend Pulmonary Rehabilitation for the exercise    Continue Psychosocial Services  Follow up required by staff             Education: Education Goals: Education classes will be provided on a weekly basis, covering required topics. Participant will state understanding/return demonstration of topics presented.  Learning Barriers/Preferences:   General Pulmonary Education Topics:  Infection Prevention: - Provides verbal and written material to individual with discussion of infection control including proper hand washing and proper equipment cleaning during exercise session. Flowsheet Row Pulmonary Rehab from 08/10/2022 in [facility] Cardiac and Pulmonary Rehab  Date 07/27/22  Educator NT  Instruction Review Code 1- Verbalizes Understanding       Falls Prevention: - Provides verbal and written material to individual with discussion of falls prevention and safety. Flowsheet Row Pulmonary Rehab from 08/10/2022 in [facility] Cardiac and Pulmonary Rehab  Date 07/27/22  Educator NT  Instruction Review Code 1- Verbalizes Understanding       Chronic Lung Disease Review: - Group verbal instruction with posters, models, PowerPoint presentations and videos,  to review new updates, new respiratory medications, new advancements in procedures and treatments. Providing information on websites and "800" numbers for continued self-education. Includes information about supplement oxygen, available portable oxygen systems, continuous and intermittent flow rates, oxygen safety, concentrators, and Medicare reimbursement for oxygen. Explanation of Pulmonary Drugs, including class, frequency, complications, importance of spacers, rinsing mouth after steroid MDI's, and proper  cleaning methods for nebulizers. Review of basic lung anatomy and physiology related to function, structure, and complications of lung disease. Review of risk factors. Discussion about methods for diagnosing sleep apnea and types of masks and machines for OSA. Includes a review of the  use of types of environmental controls: home humidity, furnaces, filters, dust mite/pet prevention, HEPA vacuums. Discussion about weather changes, air quality and the benefits of nasal washing. Instruction on Warning signs, infection symptoms, calling MD promptly, preventive modes, and value of vaccinations. Review of effective airway clearance, coughing and/or vibration techniques. Emphasizing that all should Create an Action Plan. Written material given at graduation. Flowsheet Row Pulmonary Rehab from 08/10/2018 in [facility] Cardiac and Pulmonary Rehab  Date 06/29/18  Educator [facility]  Instruction Review Code 1- Verbalizes Understanding       AED/CPR: - Group verbal and written instruction with the use of models to demonstrate the basic use of the AED with the basic ABC's of resuscitation.    Anatomy and Cardiac Procedures: - Group verbal and visual presentation and models provide information about basic cardiac anatomy and function. Reviews the testing methods done to diagnose heart disease and the outcomes of the test results. Describes the treatment choices: Medical Management, Angioplasty, or Coronary Bypass Surgery for treating various heart conditions including Myocardial Infarction, Angina, Valve Disease, and Cardiac Arrhythmias.  Written material given at graduation. Flowsheet Row Pulmonary Rehab from 08/10/2018 in [facility] Cardiac and Pulmonary Rehab  Date 05/16/18  Educator [facility]  Instruction Review Code 5- Refused Teaching       Medication Safety: - Group verbal and visual instruction to review commonly prescribed medications for heart and lung disease. Reviews the medication, class of the drug, and side effects.  Includes the steps to properly store meds and maintain the prescription regimen.  Written material given at graduation. Flowsheet Row Pulmonary Rehab from 08/10/2018 in [facility] Cardiac and Pulmonary Rehab  Date 08/10/18  Educator [facility]  Instruction Review Code 1- Verbalizes Understanding       Other: -Provides group and verbal instruction on various topics (see comments)   Knowledge Questionnaire Score:    Core Components/Risk Factors/Patient Goals at Admission:   Education:Diabetes - Individual verbal and written instruction to review signs/symptoms of diabetes, desired ranges of glucose level fasting, after meals and with exercise. Acknowledge that pre and post exercise glucose checks will be done for 3 sessions at entry of program.   Know Your Numbers and Heart Failure: - Group verbal and visual instruction to discuss disease risk factors for cardiac and pulmonary disease and treatment options.  Reviews associated critical values for Overweight/Obesity, Hypertension, Cholesterol, and Diabetes.  Discusses basics of heart failure: signs/symptoms and treatments.  Introduces Heart Failure Zone chart for action plan for heart failure.  Written material given at graduation. Flowsheet Row Pulmonary Rehab from 08/10/2022 in [facility] Cardiac and Pulmonary Rehab  Date 07/27/22  Educator SB  Instruction Review Code 1- Verbalizes Understanding       Core Components/Risk Factors/Patient Goals Review:   Goals and Risk Factor Review     Row Name 08/29/22 1125 09/19/22 1137           Core Components/Risk Factors/Patient Goals Review   Personal Goals Review Improve shortness of breath with ADL's;Weight Management/Obesity Improve shortness of breath with ADL's;Weight Management/Obesity;Increase knowledge of respiratory medications and ability to use respiratory devices properly.      Review Stacie [name] states that she would like to lose a little bit of weight with a goal of 165-170 lbs. She also has been  monitoring her O2 saturations at home and reports that they are staying above 95. She also states that she has experienced very little SOB with her ADL's. Stacie [name] has not been checking her BP at home but does have  a BP cuff and was encouraged to start monitoring it at home. Stacie [name] is doing well in rehab.  Her weight is starting to come back down.  Her breathing is doing better, she is able to get up and down more and doing more going up the stairs and needing less help from her daughter. She is doing well on her meds.  She has only been using her nebulizer when she needs it but hasn't needed it much.  She had esphogeal spasms recently and has found that peppermint oil helps relieve her symptoms.  She has linked it to either pepper or noddle/rice getting caught.      Expected Outcomes Short: Attend [name] regularly to improve shortness of breath with ADL's. Long: Continue to monitor lifestyle risk factors. Short; COntinue to work on weight loss lOng: conitnue to work on breathing               Core Components/Risk Factors/Patient Goals at Discharge (Final Review):   Goals and Risk Factor Review - 09/19/22 1137       Core Components/Risk Factors/Patient Goals Review   Personal Goals Review Improve shortness of breath with ADL's;Weight Management/Obesity;Increase knowledge of respiratory medications and ability to use respiratory devices properly.    Review Stacie [name] is doing well in rehab.  Her weight is starting to come back down.  Her breathing is doing better, she is able to get up and down more and doing more going up the stairs and needing less help from her daughter. She is doing well on her meds.  She has only been using her nebulizer when she needs it but hasn't needed it much.  She had esphogeal spasms recently and has found that peppermint oil helps relieve her symptoms.  She has linked it to either pepper or noddle/rice getting caught.    Expected Outcomes Short; COntinue to work on weight loss  lOng: conitnue to work on breathing             ITP Comments:  ITP Comments     Row Name 09/14/22 0810 10/12/22 1023 10/13/22 1444 11/09/22 0942 11/14/22 1617   ITP Comments 30 Day review completed. Medical Director ITP review done, changes made as directed, and signed approval by Medical Director. 30 Day review completed. Medical Director ITP review done, changes made as directed, and signed approval by Medical Director. Called to check on patient.  She has been out sick.    It has gone through the whole family.  She is feeling better and hopes to return soon. 30 Day review completed. Medical Director ITP review done, changes made as directed, and signed approval by Medical Director.   remains out Called to check on patient.  Last attended on 10/26/22.  She saw her pulmonologist and they think something may be going on with her heart and requested that she hold off until meeting with cardiology next week and is scheduled for a cath possibly. She is now on hold until we hear from cardiology.    Row Name 12/07/22 1304 01/04/23 0827 01/12/23 1335 01/19/23 1628 02/01/23 1153   ITP Comments 30 day review completed. ITP sent to Dr. Jinny Sanders, Medical Director of  Pulmonary Rehab. Continue with ITP unless changes are made by physician.  Pt currently out on medical hold for heart workup. Pt is scheduled for cath next month. 30 Day review completed. Medical Director ITP review done, changes made as directed, and signed approval by Medical Director. Left voicemail for patient to  follow up, left message. Has not attended since 12/16 since being placed on medical hold. If no response back, will send letter at that time. -- 30 day review completed. ITP sent to Dr. Jinny Sanders, Medical Director of  Pulmonary Rehab. Continue with ITP unless changes are made by physician.  Pt out on medical hold, no goal progress.    Row Name 02/07/23 1536 02/16/23 1240         ITP Comments A letter was sent to patient  as we have not received a response back from patient. Cathy called in response to letter.  She was planning to return, but is still waiting on some test results.  We decided that since she has not attended since January, that she would discharge from the program at this time and return with a new referral when she is ready and able to attend program fully.               Comments: Discharge ITP

## 2023-02-18 DIAGNOSIS — G4733 Obstructive sleep apnea (adult) (pediatric): Secondary | ICD-10-CM | POA: Diagnosis not present

## 2023-02-18 DIAGNOSIS — I10 Essential (primary) hypertension: Secondary | ICD-10-CM | POA: Diagnosis not present

## 2023-02-21 DIAGNOSIS — I272 Pulmonary hypertension, unspecified: Secondary | ICD-10-CM | POA: Diagnosis not present

## 2023-03-01 ENCOUNTER — Ambulatory Visit: Payer: BC Managed Care – PPO | Attending: Internal Medicine

## 2023-03-01 DIAGNOSIS — I272 Pulmonary hypertension, unspecified: Secondary | ICD-10-CM

## 2023-03-01 DIAGNOSIS — R079 Chest pain, unspecified: Secondary | ICD-10-CM

## 2023-03-01 DIAGNOSIS — D869 Sarcoidosis, unspecified: Secondary | ICD-10-CM

## 2023-03-01 LAB — ECHOCARDIOGRAM COMPLETE
AR max vel: 1.67 cm2
AV Area VTI: 1.72 cm2
AV Area mean vel: 1.61 cm2
AV Mean grad: 7 mmHg
AV Peak grad: 13 mmHg
Ao pk vel: 1.8 m/s
Area-P 1/2: 4.41 cm2
Calc EF: 52.4 %
MV VTI: 1.69 cm2
S' Lateral: 1.9 cm
Single Plane A2C EF: 52.7 %
Single Plane A4C EF: 52.3 %

## 2023-03-02 ENCOUNTER — Ambulatory Visit: Payer: BC Managed Care – PPO | Attending: Medical | Admitting: Medical

## 2023-03-02 ENCOUNTER — Encounter: Payer: Self-pay | Admitting: Medical

## 2023-03-02 VITALS — BP 142/82 | HR 103 | Ht 68.0 in | Wt 190.0 lb

## 2023-03-02 DIAGNOSIS — J9611 Chronic respiratory failure with hypoxia: Secondary | ICD-10-CM | POA: Diagnosis not present

## 2023-03-02 DIAGNOSIS — D869 Sarcoidosis, unspecified: Secondary | ICD-10-CM | POA: Diagnosis not present

## 2023-03-02 DIAGNOSIS — I272 Pulmonary hypertension, unspecified: Secondary | ICD-10-CM

## 2023-03-02 DIAGNOSIS — R079 Chest pain, unspecified: Secondary | ICD-10-CM | POA: Diagnosis not present

## 2023-03-02 DIAGNOSIS — R030 Elevated blood-pressure reading, without diagnosis of hypertension: Secondary | ICD-10-CM

## 2023-03-02 NOTE — Progress Notes (Signed)
Cardiology Office Note:    Date:  03/02/2023   ID:  Stacie [name], DOB [birth year], MRN [id number]  PCP:  Stacie Milan, MD  [facility] Cardiologist:  Yvonne Kendall, MD  [facility] Electrophysiologist:  None   Referring MD: Stacie Milan, MD   Chief Complaint: 19-month follow-up  History of Present Illness:    Stacie [name] is a 67 y.o. female with a hx of pulmonary hypertension in the setting of sarcoidosis, chronic respiratory failure with hypoxia, watershed stroke and digital ischemia leading to multiple toe amputations, hyperlipidemia, kidney stones, diverticulitis status post partial colectomy, and osteoporosis who is being seen for 82-month follow-up.  She has a long history of pulmonary sarcoidosis and was followed at [facility] for many years.  She was hospitalized at [facility] in 2021 with severe sepsis due to Klebsiella infection related to bilateral nephrolithiasis complicated by watershed/embolic CVA and digital ischemia.  We had a lengthy recovery with multiple subsequent hospitalizations.  She has been followed closely by pulmonology who has been managing her sarcoidosis.  She is currently on prednisone and inhaled treprosinil.  She is using 2.5 L of oxygen and nocturnal CPAP.  Patient was seen by Dr. Okey Dupre in February 2024 reporting vague chest pain and shortness of breath.  Patient was set up for right and left heart cath and an echo was ordered.  Right and left cardiac cath showed mild luminal irregularities involving ostial proximal LAD.  Otherwise no significant CAD.  Normal left heart filling pressures, borderline elevated right heart filling pressure, mild pulmonary hypertension.  Commended primary prevention of CAD and ongoing management of pulmonary hypertension by pulmonology.  Echo showed normal LVEF, grade 1 diastolic dysfunction, mild LVH, mild MR.  Today, the echo was reviewed. The patient is overall doing Ok. BP is mildly elevated. She is on 2l O2. She  eats low salt diet, diet is fairly healthy. No lower leg edema.  Cath site, right radial, has healed well.  Patient denies any significant chest pain.  EKG shows sinus tachycardia with a heart rate of 103 bpm  Past Medical History:  Diagnosis Date   Acute renal failure (HCC)    Acute respiratory failure (HCC) 10/23/2021   Acute respiratory failure with hypoxia (HCC)    ARF (acute respiratory failure) (HCC) 10/19/2021   Arrhythmia    patient unaware if this is current   Asthma    Chronic kidney disease    Critical lower limb ischemia (HCC)    Depression    GERD (gastroesophageal reflux disease)    Heart murmur    History of kidney stones    HOH (hard of hearing)    wear aids   Hyperthyroidism    Hypothyroidism    IBS (irritable bowel syndrome)    Pneumonia    Pulmonary hypertension (HCC)    Sarcoid    Sarcoidosis    Seasonal allergies    Sleep apnea CPAP with O2   Stroke (HCC) 07/2020   watershed   Wears hearing aid in both ears     Past Surgical History:  Procedure Laterality Date   ABDOMINAL HYSTERECTOMY     partial   AMPUTATION Left 01/14/2021   Procedure: AMPUTATION RAY (1ST TOE ) ( 2ND METATARSAL HEAD RESECTION);  Surgeon: Annice Needy, MD;  Location: [facility];  Service: Vascular;  Laterality: Left;   CARDIAC CATHETERIZATION  10/18/2018   [facility]   CATARACT EXTRACTION W/PHACO Left 07/31/2019   Procedure: CATARACT EXTRACTION PHACO AND INTRAOCULAR LENS  PLACEMENT (IOC) LEFT 00:51.1  17.9%  9.15;  Surgeon: Lockie Mola, MD;  Location: [facility];  Service: Ophthalmology;  Laterality: Left;  keep this patient second   COLON SURGERY     "colon was fused to bladder - operated on both"   COLONOSCOPY  09/18/2007   diverticuli, no polyps   COLONOSCOPY  05/26/2010   diverticuli, no polyps   CYSTOSCOPY WITH STENT PLACEMENT Bilateral 07/14/2020   Procedure: CYSTOSCOPY WITH STENT PLACEMENT, RETROPYLOGRAM;  Surgeon: Sondra Come, MD;  Location: [facility];  Service:  Urology;  Laterality: Bilateral;   CYSTOSCOPY/URETEROSCOPY/HOLMIUM LASER/STENT PLACEMENT Left 02/20/2020   Procedure: CYSTOSCOPY/URETEROSCOPY/LITHOTRIPSY /STENT PLACEMENT;  Surgeon: Vanna Scotland, MD;  Location: [facility];  Service: Urology;  Laterality: Left;   CYSTOSCOPY/URETEROSCOPY/HOLMIUM LASER/STENT PLACEMENT Bilateral 10/02/2020   Procedure: CYSTOSCOPY/URETEROSCOPY/HOLMIUM LASER/STENT PLACEMENT;  Surgeon: Sondra Come, MD;  Location: [facility];  Service: Urology;  Laterality: Bilateral;   EYE SURGERY     KYPHOPLASTY N/A 05/10/2021   Procedure: KYPHOPLASTY, L2, L3, L4;  Surgeon: Kennedy Bucker, MD;  Location: [facility];  Service: Orthopedics;  Laterality: N/A;   KYPHOPLASTY N/A 06/25/2021   Procedure: T10, T12, L1 KYPHOPLASTY;  Surgeon: Kennedy Bucker, MD;  Location: [facility];  Service: Orthopedics;  Laterality: N/A;   PARS PLANA VITRECTOMY Right 05/20/2015   Procedure: PARS PLANA VITRECTOMY WITH 25 GAUGE, laser;  Surgeon: Marcelene Butte, MD;  Location: [facility];  Service: Ophthalmology;  Laterality: Right;   PARTIAL HYSTERECTOMY  1990   RIGHT/LEFT HEART CATH AND CORONARY ANGIOGRAPHY Bilateral 12/23/2022   Procedure: RIGHT/LEFT HEART CATH AND CORONARY ANGIOGRAPHY;  Surgeon: Yvonne Kendall, MD;  Location: [facility];  Service: Cardiovascular;  Laterality: Bilateral;   TEE WITHOUT CARDIOVERSION N/A 07/27/2020   Procedure: TRANSESOPHAGEAL ECHOCARDIOGRAM (TEE);  Surgeon: Dalia Heading, MD;  Location: [facility];  Service: Cardiovascular;  Laterality: N/A;   TUBAL LIGATION     WISDOM TOOTH EXTRACTION      Current Medications: Current Meds  Medication Sig   acetaminophen (TYLENOL) 500 MG tablet Take 500 mg by mouth every 6 (six) hours as needed for moderate pain or headache.   albuterol (ACCUNEB) 1.25 MG/3ML nebulizer solution Take 1 ampule by nebulization as needed.   azithromycin (ZITHROMAX) 250 MG tablet Take 250 mg by mouth 3 (three) times a week.   budesonide (PULMICORT) 0.25  MG/2ML nebulizer solution Take 0.25 mg by nebulization daily as needed.   denosumab (PROLIA) 60 MG/ML SOSY injection Inject 60 mg into the skin every 6 (six) months.   dorzolamide (TRUSOPT) 2 % ophthalmic solution Place 1 drop into both eyes 2 (two) times daily.   escitalopram (LEXAPRO) 20 MG tablet Take 1 tablet (20 mg total) by mouth daily.   Fluticasone-Umeclidin-Vilant (TRELEGY ELLIPTA) 200-62.5-25 MCG/ACT AEPB Inhale into the lungs daily.   latanoprost (XALATAN) 0.005 % ophthalmic solution Place 1 drop into the right eye 2 (two) times daily.   levothyroxine (SYNTHROID) 137 MCG tablet Take 1 tablet (137 mcg total) by mouth daily before breakfast.   Multiple Vitamins-Minerals (MULTIVITAMIN WITH MINERALS) tablet Take 1 tablet by mouth daily.   naproxen sodium (ALEVE) 220 MG tablet Take 220 mg by mouth daily as needed (pain).   NON FORMULARY CPAP nightly   OXYGEN Inhale 2 L into the lungs daily as needed (During walking and activity).    predniSONE (DELTASONE) 10 MG tablet Take 2 tablets (20 mg total) by mouth daily. Resume only after the finishing new Rx for prednisone (Patient taking differently: Take 13  mg by mouth daily.)   Probiotic Product (PROBIOTIC PO) Take 1 capsule by mouth daily.   Treprostinil (TYVASO) 0.6 MG/ML SOLN Inhale into the lungs See admin instructions. 12 breaths 4 times a day     Allergies:   Nitrofurantoin, Plaquenil [hydroxychloroquine], Tramadol, and Sulfa antibiotics   Social History   Socioeconomic History   Marital status: Married    Spouse name: Not on file   Number of children: 1   Years of education: Not on file   Highest education level: Not on file  Occupational History   Occupation: Homemaker  Tobacco Use   Smoking status: Never   Smokeless tobacco: Never  Vaping Use   Vaping Use: Never used  Substance and Sexual Activity   Alcohol use: No   Drug use: No   Sexual activity: Not on file  Other Topics Concern   Not on file  Social History  Narrative   Not on file   Social Determinants of Health   Financial Resource Strain: Low Risk  (06/24/2022)   Overall Financial Resource Strain (CARDIA)    Difficulty of Paying Living Expenses: Not hard at all  Food Insecurity: No Food Insecurity (06/24/2022)   Hunger Vital Sign    Worried About Running Out of Food in the Last Year: Never true    Ran Out of Food in the Last Year: Never true  Transportation Needs: No Transportation Needs (06/24/2022)   PRAPARE - Administrator, Civil Service (Medical): No    Lack of Transportation (Non-Medical): No  Physical Activity: Not on file  Stress: Not on file  Social Connections: Not on file     Family History: The patient's family history includes Allergies in her brother and father; Asthma in her brother and father; Breast cancer in her maternal grandmother; Colon cancer in her father; Pancreatic cancer in her brother.  ROS:   Please see the history of present illness.     All other systems reviewed and are negative.  EKGs/Labs/Other Studies Reviewed:    The following studies were reviewed today:  Echo 03/01/23  1. Left ventricular ejection fraction, by estimation, is 65 to 70%. The  left ventricle has normal function. The left ventricle has no regional  wall motion abnormalities. There is mild left ventricular hypertrophy.  Left ventricular diastolic parameters  are consistent with Grade I diastolic dysfunction (impaired relaxation).   2. Right ventricular systolic function is normal. The right ventricular  size is normal.   3. The mitral valve is degenerative. Mild mitral valve regurgitation.   4. The aortic valve is tricuspid. Aortic valve regurgitation is not  visualized.   5. Aortic dilatation noted. There is borderline dilatation of the  ascending aorta, measuring 38 mm.   Cardiac cath 12/2022 Conclusions: Mild luminal irregularities involving the ostial and proximal LAD.  Otherwise, no angiographically significant  coronary artery disease. Normal left heart filling pressures (LVEDP 12 mmHg, PCWP 13 mmHg). Borderline elevated right heart filling pressure (mean RA/RVEDP 8 mmHg). Mild pulmonary hypertension (mean PA 28 mmHg, PVR 3.1 WU). Normal Fick cardiac output/index.   Recommendations: Primary prevention of coronary artery disease. Ongoing management of pulmonary hypertension per Dr. Karna Christmas.   Yvonne Kendall, MD [facility]     Recommendations  Antiplatelet/Anticoag No indication for antiplatelet therapy at this time .  Discharge Date In the absence of any other complications or medical issues, we expect the patient to be ready for discharge from a cath perspective on 12/23/2022.  EKG:  EKG is ordered today.  The ekg ordered today demonstrates sinus tachycardia, 103 bpm, LAD, no ischemic changes  Recent Labs: 06/24/2022: ALT 15; TSH 0.127 12/20/2022: BUN 30; Creatinine, Ser 1.18; Platelets 296 12/23/2022: Hemoglobin 12.6; Potassium 3.1; Sodium 142  Recent Lipid Panel    Component Value Date/Time   CHOL 289 (H) 07/21/2020 0617   CHOL 215 (H) 05/27/2019 0947   TRIG 290 (H) 07/21/2020 0617   HDL 33 (L) 07/21/2020 0617   HDL 59 05/27/2019 0947   CHOLHDL 8.8 07/21/2020 0617   VLDL 58 (H) 07/21/2020 0617   LDLCALC 198 (H) 07/21/2020 0617   LDLCALC 125 (H) 05/27/2019 0947     Physical Exam:    VS:  BP (!) 142/82 (BP Location: Left Arm, Patient Position: Sitting, Cuff Size: Normal)   Pulse (!) 103   Ht 5\' 8"  (1.727 m)   Wt 190 lb (86.2 kg)   LMP  (LMP Unknown)   SpO2 95% Comment: on 2L O2  BMI 28.89 kg/m     Wt Readings from Last 3 Encounters:  03/02/23 190 lb (86.2 kg)  12/23/22 175 lb (79.4 kg)  11/23/22 188 lb (85.3 kg)     GEN:  Well nourished, well developed in no acute distress HEENT: Normal NECK: No JVD; No carotid bruits LYMPHATICS: No lymphadenopathy CARDIAC: RRR, no murmurs, rubs, gallops RESPIRATORY:  +wheezing + rhonchi  ABDOMEN: Soft, non-tender,  non-distended MUSCULOSKELETAL:  No edema; No deformity  SKIN: Warm and dry NEUROLOGIC:  Alert and oriented x 3 PSYCHIATRIC:  Normal affect   ASSESSMENT:    1. Pulmonary hypertension (HCC)   2. Chronic respiratory failure with hypoxia (HCC)   3. Sarcoidosis   4. Chest pain of uncertain etiology   5. Elevated blood pressure reading    PLAN:    In order of problems listed above:  Mild pulmonary HTN Chronic respiratory failure with hypoxia on 2L O2 Sarcoidosis Recent right and left heart cath showed normal left heart filling pressures, borderline elevated right heart filling pressure, mild pulmonary hypertension, nonobstructive CAD.  Right radial cath site is stable.  Echo showed EF 65 to 70%, mild LVH, grade 1 diastolic dysfunction, mild MR.  Patient follows with pulmonary for ongoing management of sarcoidosis, respiratory failure and pulmonary hypertension.  Patient is on baseline 2 L O2.  She denies any significant chest pain.  Patient appears euvolemic on exam.  EKG today shows sinus tachycardia with a heart rate of 103 bpm, which appears to be normal for the patient.  I will check with MD if he is wanting further workup with cardiac MRI.  Elevated blood pressure Blood pressure mildly elevated today, similar to prior visit.  Patient is not on antihypertensive medication at baseline.  She says blood pressure at home is normal.  She may benefit from beta-blocker, (likely bisoprolol given underlying lung disease).  This would also benefit her tachycardia  Disposition: Follow up in 6 month(s) with Md/APP    Signed, Shayra Anton David Stall, PA-C  03/02/2023 12:27 PM    [facility]

## 2023-03-02 NOTE — Patient Instructions (Signed)
Medication Instructions:  Your physician recommends that you continue on your current medications as directed. Please refer to the Current Medication list given to you today.  *If you need a refill on your cardiac medications before your next appointment, please call your pharmacy*  Lab Work: -None ordered If you have labs (blood work) drawn today and your tests are completely normal, you will receive your results only by: MyChart Message (if you have MyChart) OR A paper copy in the mail If you have any lab test that is abnormal or we need to change your treatment, we will call you to review the results.  Testing/Procedures: -None ordered  Follow-Up: At [facility], you and your health needs are our priority.  As part of our continuing mission to provide you with exceptional heart care, we have created designated Provider Care Teams.  These Care Teams include your primary Cardiologist (physician) and Advanced Practice Providers (APPs -  Physician Assistants and Nurse Practitioners) who all work together to provide you with the care you need, when you need it.  Your next appointment:   6 month(s)  Provider:   You may see Yvonne Kendall, MD or one of the following Advanced Practice Providers on your designated Care Team:   Nicolasa Ducking, NP Eula Listen, PA-C Cadence Fransico Michael, PA-C Charlsie Quest, NP    Other Instructions -None

## 2023-03-06 ENCOUNTER — Other Ambulatory Visit: Payer: Self-pay

## 2023-03-06 DIAGNOSIS — D869 Sarcoidosis, unspecified: Secondary | ICD-10-CM

## 2023-03-08 ENCOUNTER — Telehealth: Payer: Self-pay | Admitting: Internal Medicine

## 2023-03-08 NOTE — Telephone Encounter (Signed)
Patient would like to know why an MRI was ordered.

## 2023-03-08 NOTE — Telephone Encounter (Signed)
Pt called questioning as to why cardiac mri was ordered.  Pt made aware of the following list below.  Will arrange for cardiac MRI to exclude cardiac involvement with sarcoidosis.   Pt verbalized understanding

## 2023-03-09 NOTE — Telephone Encounter (Signed)
Pt wants the MRI ordered, pt would like her MRI ordered at [facility]. She stated they are not affiliated with [facility].

## 2023-03-10 NOTE — Telephone Encounter (Signed)
Pt made aware unfortunately test would not be able to be completed at an outside facility ordered under a [facility] physician.  Pt made aware and verbalized understanding

## 2023-03-21 DIAGNOSIS — G4733 Obstructive sleep apnea (adult) (pediatric): Secondary | ICD-10-CM | POA: Diagnosis not present

## 2023-03-21 DIAGNOSIS — I10 Essential (primary) hypertension: Secondary | ICD-10-CM | POA: Diagnosis not present

## 2023-04-11 ENCOUNTER — Telehealth (HOSPITAL_COMMUNITY): Payer: Self-pay | Admitting: *Deleted

## 2023-04-11 NOTE — Telephone Encounter (Signed)
Reaching out to patient to offer assistance regarding upcoming cardiac imaging study; pt verbalizes understanding of appt date/time, parking situation and where to check in, and verified current allergies; name and call back number provided for further questions should they arise  Daziyah Cogan RN Navigator Cardiac Imaging [facility] [phone] office [phone] cell  Patient reports having an MRI in the past without incident. 

## 2023-04-12 ENCOUNTER — Ambulatory Visit
Admission: RE | Admit: 2023-04-12 | Discharge: 2023-04-12 | Disposition: A | Discharge status: Home / Self Care | Payer: BC Managed Care – PPO | Source: Ambulatory Visit | Attending: Medical | Admitting: Medical

## 2023-04-12 ENCOUNTER — Other Ambulatory Visit: Payer: Self-pay | Admitting: Medical

## 2023-04-12 DIAGNOSIS — D869 Sarcoidosis, unspecified: Secondary | ICD-10-CM | POA: Insufficient documentation

## 2023-04-12 MED ORDER — GADOBUTROL 1 MMOL/ML IV SOLN
12.0000 mL | Freq: Once | INTRAVENOUS | Status: AC | PRN
  Administered 2023-04-12: 12 mL via INTRAVENOUS

## 2023-04-26 ENCOUNTER — Encounter: Payer: BC Managed Care – PPO | Attending: Pulmonary Disease | Admitting: *Deleted

## 2023-04-26 ENCOUNTER — Encounter: Payer: Self-pay | Admitting: *Deleted

## 2023-04-26 DIAGNOSIS — D860 Sarcoidosis of lung: Secondary | ICD-10-CM

## 2023-04-26 NOTE — Progress Notes (Signed)
Initial phone call completed. Diagnosis can be found in [facility] 5/7. EP Orientation scheduled for Wednesday 7/17 at 9:30.

## 2023-04-28 DIAGNOSIS — M25512 Pain in left shoulder: Secondary | ICD-10-CM | POA: Diagnosis not present

## 2023-04-28 DIAGNOSIS — W010XXA Fall on same level from slipping, tripping and stumbling without subsequent striking against object, initial encounter: Secondary | ICD-10-CM | POA: Diagnosis not present

## 2023-04-28 DIAGNOSIS — S42035A Nondisplaced fracture of lateral end of left clavicle, initial encounter for closed fracture: Secondary | ICD-10-CM | POA: Diagnosis not present

## 2023-05-03 ENCOUNTER — Ambulatory Visit: Payer: BC Managed Care – PPO

## 2023-05-04 DIAGNOSIS — I10 Essential (primary) hypertension: Secondary | ICD-10-CM | POA: Diagnosis not present

## 2023-05-04 DIAGNOSIS — G4733 Obstructive sleep apnea (adult) (pediatric): Secondary | ICD-10-CM | POA: Diagnosis not present

## 2023-05-11 DIAGNOSIS — S42035D Nondisplaced fracture of lateral end of left clavicle, subsequent encounter for fracture with routine healing: Secondary | ICD-10-CM | POA: Diagnosis not present

## 2023-05-11 DIAGNOSIS — M25512 Pain in left shoulder: Secondary | ICD-10-CM | POA: Diagnosis not present

## 2023-05-11 DIAGNOSIS — W010XXD Fall on same level from slipping, tripping and stumbling without subsequent striking against object, subsequent encounter: Secondary | ICD-10-CM | POA: Diagnosis not present

## 2023-06-04 DIAGNOSIS — I10 Essential (primary) hypertension: Secondary | ICD-10-CM | POA: Diagnosis not present

## 2023-06-04 DIAGNOSIS — G4733 Obstructive sleep apnea (adult) (pediatric): Secondary | ICD-10-CM | POA: Diagnosis not present

## 2023-06-12 ENCOUNTER — Encounter: Payer: BC Managed Care – PPO | Attending: Pulmonary Disease

## 2023-06-12 VITALS — Ht 67.0 in | Wt 187.7 lb

## 2023-06-12 DIAGNOSIS — D860 Sarcoidosis of lung: Secondary | ICD-10-CM | POA: Insufficient documentation

## 2023-06-12 NOTE — Patient Instructions (Signed)
Patient Instructions  Patient Details  Name: Stacie [name] MRN: [id number] Date of Birth: 1956/01/23 Referring Provider:  Vida Rigger, MD  Below are your personal goals for exercise, nutrition, and risk factors. Our goal is to help you stay on track towards obtaining and maintaining these goals. We will be discussing your progress on these goals with you throughout the program.  Initial Exercise Prescription:  Initial Exercise Prescription - 06/12/23 1600       Date of Initial Exercise RX and Referring Provider   Date 06/12/23    Referring Provider Vida Rigger      Oxygen   Oxygen Continuous    Liters 2    Maintain Oxygen Saturation 88% or higher      Treadmill   MPH 1.2    Grade 0    Minutes 15    METs 1.92      NuStep   Level 1   T6   SPM 80    Minutes 15      Biostep-RELP   Level 1    SPM 50    Minutes 15      Track   Laps 14   Hallway   Minutes 15    METs 1.76      Prescription Details   Frequency (times per week) 2    Duration Progress to 30 minutes of continuous aerobic without signs/symptoms of physical distress      Intensity   THRR 40-80% of Max Heartrate 131-145    Ratings of Perceived Exertion 11-13    Perceived Dyspnea 0-4      Progression   Progression Continue to progress workloads to maintain intensity without signs/symptoms of physical distress.      Resistance Training   Training Prescription Yes    Weight 2lb    Reps 10-15             Exercise Goals: Frequency: Be able to perform aerobic exercise two to three times per week in program working toward 2-5 days per week of home exercise.  Intensity: Work with a perceived exertion of 11 (fairly light) - 15 (hard) while following your exercise prescription.  We will make changes to your prescription with you as you progress through the program.   Duration: Be able to do 30 to 45 minutes of continuous aerobic exercise in addition to a 5 minute warm-up and a 5 minute cool-down  routine.   Nutrition Goals: Your personal nutrition goals will be established when you do your nutrition analysis with the dietician.  The following are general nutrition guidelines to follow: Cholesterol < 200mg /day Sodium < 1500mg /day Fiber: Women over 50 yrs - 21 grams per day  Personal Goals:  Personal Goals and Risk Factors at Admission - 06/12/23 1646       Core Components/Risk Factors/Patient Goals on Admission    Weight Management Yes;Weight Maintenance;Weight Loss    Intervention Weight Management: Develop a combined nutrition and exercise program designed to reach desired caloric intake, while maintaining appropriate intake of nutrient and fiber, sodium and fats, and appropriate energy expenditure required for the weight goal.;Weight Management: Provide education and appropriate resources to help participant work on and attain dietary goals.;Weight Management/Obesity: Establish reasonable short term and long term weight goals.    Admit Weight 187 lb 11.2 oz (85.1 kg)    Goal Weight: Short Term 172 lb (78 kg)    Goal Weight: Long Term 150 lb (68 kg)    Expected Outcomes Short Term: Continue to  assess and modify interventions until short term weight is achieved;Long Term: Adherence to nutrition and physical activity/exercise program aimed toward attainment of established weight goal;Weight Loss: Understanding of general recommendations for a balanced deficit meal plan, which promotes 1-2 lb weight loss per week and includes a negative energy balance of [phone] kcal/d;Understanding recommendations for meals to include 15-35% energy as protein, 25-35% energy from fat, 35-60% energy from carbohydrates, less than 200mg  of dietary cholesterol, 20-35 gm of total fiber daily;Understanding of distribution of calorie intake throughout the day with the consumption of 4-5 meals/snacks    Improve shortness of breath with ADL's Yes    Intervention Provide education, individualized exercise plan and  daily activity instruction to help decrease symptoms of SOB with activities of daily living.    Expected Outcomes Short Term: Improve cardiorespiratory fitness to achieve a reduction of symptoms when performing ADLs;Long Term: Be able to perform more ADLs without symptoms or delay the onset of symptoms    Hypertension Yes    Intervention Provide education on lifestyle modifcations including regular physical activity/exercise, weight management, moderate sodium restriction and increased consumption of fresh fruit, vegetables, and low fat dairy, alcohol moderation, and smoking cessation.;Monitor prescription use compliance.    Expected Outcomes Short Term: Continued assessment and intervention until BP is < 140/30mm HG in hypertensive participants. < 130/50mm HG in hypertensive participants with diabetes, heart failure or chronic kidney disease.;Long Term: Maintenance of blood pressure at goal levels.    Lipids Yes    Intervention Provide education and support for participant on nutrition & aerobic/resistive exercise along with prescribed medications to achieve LDL 70mg , HDL >40mg .    Expected Outcomes Short Term: Participant states understanding of desired cholesterol values and is compliant with medications prescribed. Participant is following exercise prescription and nutrition guidelines.;Long Term: Cholesterol controlled with medications as prescribed, with individualized exercise RX and with personalized nutrition plan. Value goals: LDL < 70mg , HDL > 40 mg.             Tobacco Use Initial Evaluation: Social History   Tobacco Use  Smoking Status Never  Smokeless Tobacco Never    Exercise Goals and Review:  Exercise Goals     Row Name 06/12/23 1643             Exercise Goals   Increase Physical Activity Yes       Intervention Provide advice, education, support and counseling about physical activity/exercise needs.;Develop an individualized exercise prescription for aerobic and  resistive training based on initial evaluation findings, risk stratification, comorbidities and participant's personal goals.       Expected Outcomes Short Term: Attend rehab on a regular basis to increase amount of physical activity.;Long Term: Exercising regularly at least 3-5 days a week.;Long Term: Add in home exercise to make exercise part of routine and to increase amount of physical activity.       Increase Strength and Stamina Yes       Intervention Provide advice, education, support and counseling about physical activity/exercise needs.;Develop an individualized exercise prescription for aerobic and resistive training based on initial evaluation findings, risk stratification, comorbidities and participant's personal goals.       Expected Outcomes Short Term: Increase workloads from initial exercise prescription for resistance, speed, and METs.;Short Term: Perform resistance training exercises routinely during rehab and add in resistance training at home;Long Term: Improve cardiorespiratory fitness, muscular endurance and strength as measured by increased METs and functional capacity ( )       Able to understand and  use rate of perceived exertion (RPE) scale Yes       Intervention Provide education and explanation on how to use RPE scale       Expected Outcomes Short Term: Able to use RPE daily in rehab to express subjective intensity level;Long Term:  Able to use RPE to guide intensity level when exercising independently       Able to understand and use Dyspnea scale Yes       Intervention Provide education and explanation on how to use Dyspnea scale       Expected Outcomes Short Term: Able to use Dyspnea scale daily in rehab to express subjective sense of shortness of breath during exertion;Long Term: Able to use Dyspnea scale to guide intensity level when exercising independently       Knowledge and understanding of Target Heart Rate Range (THRR) Yes       Intervention Provide education and  explanation of THRR including how the numbers were predicted and where they are located for reference       Expected Outcomes Short Term: Able to state/look up THRR;Short Term: Able to use daily as guideline for intensity in rehab;Long Term: Able to use THRR to govern intensity when exercising independently       Able to check pulse independently Yes       Intervention Provide education and demonstration on how to check pulse in carotid and radial arteries.;Review the importance of being able to check your own pulse for safety during independent exercise       Expected Outcomes Short Term: Able to explain why pulse checking is important during independent exercise;Long Term: Able to check pulse independently and accurately       Understanding of Exercise Prescription Yes       Intervention Provide education, explanation, and written materials on patient's individual exercise prescription       Expected Outcomes Short Term: Able to explain program exercise prescription;Long Term: Able to explain home exercise prescription to exercise independently                Copy of goals given to participant.

## 2023-06-12 NOTE — Progress Notes (Signed)
Pulmonary Individual Treatment Plan  Patient Details  Name: Stacie [name] MRN: [id number] Date of Birth: [birth year] Referring Provider:   Flowsheet Row Pulmonary Rehab from 06/12/2023 in [facility] Cardiac and Pulmonary Rehab  Referring Provider Vida Rigger       Initial Encounter Date:  Flowsheet Row Pulmonary Rehab from 06/12/2023 in [facility] Cardiac and Pulmonary Rehab  Date 06/12/23       Visit Diagnosis: Sarcoidosis of lung (HCC)  Patient's Home Medications on Admission:  Current Outpatient Medications:    acetaminophen (TYLENOL) 500 MG tablet, Take 500 mg by mouth every 6 (six) hours as needed for moderate pain or headache., Disp: , Rfl:    albuterol (ACCUNEB) 1.25 MG/3ML nebulizer solution, Take 1 ampule by nebulization as needed., Disp: , Rfl:    azithromycin (ZITHROMAX) 250 MG tablet, Take 250 mg by mouth 3 (three) times a week., Disp: , Rfl:    budesonide (PULMICORT) 0.25 MG/2ML nebulizer solution, Take 0.25 mg by nebulization daily as needed., Disp: , Rfl:    denosumab (PROLIA) 60 MG/ML SOSY injection, Inject 60 mg into the skin every 6 (six) months., Disp: , Rfl:    dorzolamide (TRUSOPT) 2 % ophthalmic solution, Place 1 drop into both eyes 2 (two) times daily., Disp: , Rfl:    escitalopram (LEXAPRO) 20 MG tablet, Take 1 tablet (20 mg total) by mouth daily., Disp: 90 tablet, Rfl: 3   Fluticasone-Umeclidin-Vilant (TRELEGY ELLIPTA) 200-62.5-25 MCG/ACT AEPB, Inhale into the lungs daily., Disp: , Rfl:    latanoprost (XALATAN) 0.005 % ophthalmic solution, Place 1 drop into the right eye 2 (two) times daily., Disp: , Rfl:    levothyroxine (SYNTHROID) 137 MCG tablet, Take 1 tablet (137 mcg total) by mouth daily before breakfast., Disp: 90 tablet, Rfl: 3   Multiple Vitamins-Minerals (MULTIVITAMIN WITH MINERALS) tablet, Take 1 tablet by mouth daily., Disp: , Rfl:    naproxen sodium (ALEVE) 220 MG tablet, Take 220 mg by mouth daily as needed (pain)., Disp: , Rfl:    NON FORMULARY, CPAP  nightly, Disp: , Rfl:    OXYGEN, Inhale 2 L into the lungs daily as needed (During walking and activity). , Disp: , Rfl:    predniSONE (DELTASONE) 10 MG tablet, Take 2 tablets (20 mg total) by mouth daily. Resume only after the finishing new Rx for prednisone (Patient taking differently: Take 13 mg by mouth daily.), Disp: , Rfl:    Probiotic Product (PROBIOTIC PO), Take 1 capsule by mouth daily., Disp: , Rfl:    Treprostinil (TYVASO) 0.6 MG/ML SOLN, Inhale into the lungs See admin instructions. 12 breaths 4 times a day, Disp: , Rfl:   Past Medical History: Past Medical History:  Diagnosis Date   Acute renal failure (HCC)    Acute respiratory failure (HCC) 10/23/2021   Acute respiratory failure with hypoxia (HCC)    ARF (acute respiratory failure) (HCC) 10/19/2021   Arrhythmia    patient unaware if this is current   Asthma    Chronic kidney disease    Critical lower limb ischemia (HCC)    Depression    GERD (gastroesophageal reflux disease)    Heart murmur    History of kidney stones    HOH (hard of hearing)    wear aids   Hyperthyroidism    Hypothyroidism    IBS (irritable bowel syndrome)    Pneumonia    Pulmonary hypertension (HCC)    Sarcoid    Sarcoidosis    Seasonal allergies    Sleep apnea CPAP with  O2   Stroke (HCC) 07/2020   watershed   Wears hearing aid in both ears     Tobacco Use: Social History   Tobacco Use  Smoking Status Never  Smokeless Tobacco Never    Labs: Review Flowsheet  More data exists      Latest Ref Rng & Units 07/21/2020 01/14/2021 10/19/2021 06/24/2022 12/23/2022  Labs for ITP Cardiac and Pulmonary Rehab  Cholestrol 0 - 200 mg/dL 732  - - - -  LDL (calc) 0 - 99 mg/dL 202  - - - -  HDL-C >54 mg/dL 33  - - - -  Trlycerides <150 mg/dL 270  - - - -  Hemoglobin A1c 4.8 - 5.6 % - - - 6.1  -  PH, Arterial 7.35 - 7.45 - - - - 7.413   PCO2 arterial 32 - 48 mmHg - - - - 44.3   Bicarbonate 20.0 - 28.0 mmol/L - - 31.7  - 28.3  30.0   TCO2 22 - 32 mmol/L  - 30  - - 30  32   O2 Saturation % - - 33.6  - 95  65     Details       Multiple values from one day are sorted in reverse-chronological order          Pulmonary Assessment Scores:  Pulmonary Assessment Scores     Row Name 06/12/23 1620         ADL UCSD   ADL Phase Entry     SOB Score total 91     Rest 1     Walk 3     Stairs 5     Bath 3     Dress 4     Shop 5       CAT Score   CAT Score 18       mMRC Score   mMRC Score 3              UCSD: Self-administered rating of dyspnea associated with activities of daily living (ADLs) 6-point scale (0 = "not at all" to 5 = "maximal or unable to do because of breathlessness")  Scoring Scores range from 0 to 120.  Minimally important difference is 5 units  CAT: CAT can identify the health impairment of COPD patients and is better correlated with disease progression.  CAT has a scoring range of zero to 40. The CAT score is classified into four groups of low (less than 10), medium (10 - 20), high (21-30) and very high (31-40) based on the impact level of disease on health status. A CAT score over 10 suggests significant symptoms.  A worsening CAT score could be explained by an exacerbation, poor medication adherence, poor inhaler technique, or progression of COPD or comorbid conditions.  CAT MCID is 2 points  mMRC: mMRC (Modified Medical Research Council) Dyspnea Scale is used to assess the degree of baseline functional disability in patients of respiratory disease due to dyspnea. No minimal important difference is established. A decrease in score of 1 point or greater is considered a positive change.   Pulmonary Function Assessment:   Exercise Target Goals: Exercise Program Goal: Individual exercise prescription set using results from initial 6 min walk test and THRR while considering  patient's activity barriers and safety.   Exercise Prescription Goal: Initial exercise prescription builds to 30-45 minutes a day of  aerobic activity, 2-3 days per week.  Home exercise guidelines will be given to patient during program as part  of exercise prescription that the participant will acknowledge.  Education: Aerobic Exercise: - Group verbal and visual presentation on the components of exercise prescription. Introduces F.I.T.T principle from ACSM for exercise prescriptions.  Reviews F.I.T.T. principles of aerobic exercise including progression. Written material given at graduation. Flowsheet Row Pulmonary Rehab from 08/10/2018 in [facility] Cardiac and Pulmonary Rehab  Date 05/02/18  Educator [facility]  Instruction Review Code 1- Verbalizes Understanding       Education: Resistance Exercise: - Group verbal and visual presentation on the components of exercise prescription. Introduces F.I.T.T principle from ACSM for exercise prescriptions  Reviews F.I.T.T. principles of resistance exercise including progression. Written material given at graduation.    Education: Exercise & Equipment Safety: - Individual verbal instruction and demonstration of equipment use and safety with use of the equipment. Flowsheet Row Pulmonary Rehab from 06/12/2023 in [facility] Cardiac and Pulmonary Rehab  Date 06/12/23  Educator [facility]  Instruction Review Code 1- Verbalizes Understanding       Education: Exercise Physiology & General Exercise Guidelines: - Group verbal and written instruction with models to review the exercise physiology of the cardiovascular system and associated critical values. Provides general exercise guidelines with specific guidelines to those with heart or lung disease.  Flowsheet Row Pulmonary Rehab from 08/10/2018 in [facility] Cardiac and Pulmonary Rehab  Date 07/11/18  Educator [facility]  Instruction Review Code 1- Verbalizes Understanding       Education: Flexibility, Balance, Mind/Body Relaxation: - Group verbal and visual presentation with interactive activity on the components of exercise prescription. Introduces F.I.T.T  principle from ACSM for exercise prescriptions. Reviews F.I.T.T. principles of flexibility and balance exercise training including progression. Also discusses the mind body connection.  Reviews various relaxation techniques to help reduce and manage stress (i.e. Deep breathing, progressive muscle relaxation, and visualization). Balance handout provided to take home. Written material given at graduation. Flowsheet Row Pulmonary Rehab from 08/10/2018 in [facility] Cardiac and Pulmonary Rehab  Date 08/08/18  Educator [facility]  Instruction Review Code 1- Verbalizes Understanding       Activity Barriers & Risk Stratification:  Activity Barriers & Cardiac Risk Stratification - 06/12/23 1637       Activity Barriers & Cardiac Risk Stratification   Activity Barriers Muscular Weakness;Balance Concerns;Other (comment);Deconditioning    Comments Healing from broken left collarbone             6 Minute Walk:  6 Minute Walk     Row Name 06/12/23 1634         6 Minute Walk   Phase Initial     Distance 600 feet     Walk Time 4.77 minutes     # of Rest Breaks 1     MPH 1.43     METS 2.19     RPE 18     Perceived Dyspnea  3     VO2 Peak 7.69     Symptoms Yes (comment)     Comments Lightheadedness at the end of test     Resting HR 117 bpm     Resting BP 150/90     Resting Oxygen Saturation  90 %     Exercise Oxygen Saturation  during 6 min walk 85 %     Max Ex. HR 134 bpm     Max Ex. BP 150/80     2 Minute Post BP 148/80       Interval HR   1 Minute HR 118     2 Minute HR 127  3 Minute HR 128     4 Minute HR 124     5 Minute HR 88     6 Minute HR 134     2 Minute Post HR 116     Interval Heart Rate? Yes       Interval Oxygen   Interval Oxygen? Yes     Baseline Oxygen Saturation % 90 %     1 Minute Oxygen Saturation % 87 %     1 Minute Liters of Oxygen 2 L     2 Minute Oxygen Saturation % 86 %     2 Minute Liters of Oxygen 2 L     3 Minute Oxygen Saturation % 85 %     3 Minute  Liters of Oxygen 2 L     4 Minute Oxygen Saturation % 87 %     4 Minute Liters of Oxygen 2 L     5 Minute Oxygen Saturation % 86 %     5 Minute Liters of Oxygen 2 L     6 Minute Oxygen Saturation % 85 %     6 Minute Liters of Oxygen 2 L     2 Minute Post Oxygen Saturation % 93 %     2 Minute Post Liters of Oxygen 2 L             Oxygen Initial Assessment:  Oxygen Initial Assessment - 04/26/23 1002       Home Oxygen   Home Oxygen Device Portable Concentrator;Home Concentrator    Sleep Oxygen Prescription CPAP    Liters per minute 2    Home Exercise Oxygen Prescription Continuous    Liters per minute 2    Home Resting Oxygen Prescription Continuous    Liters per minute 2    Compliance with Home Oxygen Use Yes      Intervention   Short Term Goals To learn and exhibit compliance with exercise, home and travel O2 prescription;To learn and understand importance of monitoring SPO2 with pulse oximeter and demonstrate accurate use of the pulse oximeter.;To learn and understand importance of maintaining oxygen saturations>88%;To learn and demonstrate proper pursed lip breathing techniques or other breathing techniques. ;To learn and demonstrate proper use of respiratory medications    Long  Term Goals Exhibits compliance with exercise, home  and travel O2 prescription;Verbalizes importance of monitoring SPO2 with pulse oximeter and return demonstration;Maintenance of O2 saturations>88%;Exhibits proper breathing techniques, such as pursed lip breathing or other method taught during program session;Compliance with respiratory medication;Demonstrates proper use of MDI's             Oxygen Re-Evaluation:   Oxygen Discharge (Final Oxygen Re-Evaluation):   Initial Exercise Prescription:  Initial Exercise Prescription - 06/12/23 1600       Date of Initial Exercise RX and Referring Provider   Date 06/12/23    Referring Provider Vida Rigger      Oxygen   Oxygen Continuous     Liters 2    Maintain Oxygen Saturation 88% or higher      Treadmill   MPH 1.2    Grade 0    Minutes 15    METs 1.92      NuStep   Level 1   T6   SPM 80    Minutes 15      Biostep-RELP   Level 1    SPM 50    Minutes 15      Track   Laps 14  Hallway   Minutes 15    METs 1.76      Prescription Details   Frequency (times per week) 2    Duration Progress to 30 minutes of continuous aerobic without signs/symptoms of physical distress      Intensity   THRR 40-80% of Max Heartrate 131-145    Ratings of Perceived Exertion 11-13    Perceived Dyspnea 0-4      Progression   Progression Continue to progress workloads to maintain intensity without signs/symptoms of physical distress.      Resistance Training   Training Prescription Yes    Weight 2lb    Reps 10-15             Perform Capillary Blood Glucose checks as needed.  Exercise Prescription Changes:   Exercise Prescription Changes     Row Name 06/12/23 1600             Response to Exercise   Blood Pressure (Admit) 150/90       Blood Pressure (Exercise) 150/80       Blood Pressure (Exit) 112/80       Heart Rate (Admit) 117 bpm       Heart Rate (Exercise) 134 bpm       Heart Rate (Exit) 117 bpm       Oxygen Saturation (Admit) 90 %       Oxygen Saturation (Exercise) 85 %       Oxygen Saturation (Exit) 91 %       Rating of Perceived Exertion (Exercise) 18       Perceived Dyspnea (Exercise) 3       Symptoms Lightheadedness at end of       Comments results                Exercise Comments:   Exercise Goals and Review:   Exercise Goals     Row Name 06/12/23 1643             Exercise Goals   Increase Physical Activity Yes       Intervention Provide advice, education, support and counseling about physical activity/exercise needs.;Develop an individualized exercise prescription for aerobic and resistive training based on initial evaluation findings, risk stratification,  comorbidities and participant's personal goals.       Expected Outcomes Short Term: Attend rehab on a regular basis to increase amount of physical activity.;Long Term: Exercising regularly at least 3-5 days a week.;Long Term: Add in home exercise to make exercise part of routine and to increase amount of physical activity.       Increase Strength and Stamina Yes       Intervention Provide advice, education, support and counseling about physical activity/exercise needs.;Develop an individualized exercise prescription for aerobic and resistive training based on initial evaluation findings, risk stratification, comorbidities and participant's personal goals.       Expected Outcomes Short Term: Increase workloads from initial exercise prescription for resistance, speed, and METs.;Short Term: Perform resistance training exercises routinely during rehab and add in resistance training at home;Long Term: Improve cardiorespiratory fitness, muscular endurance and strength as measured by increased METs and functional capacity ( )       Able to understand and use rate of perceived exertion (RPE) scale Yes       Intervention Provide education and explanation on how to use RPE scale       Expected Outcomes Short Term: Able to use RPE daily in rehab to express subjective intensity level;Long Term:  Able to use RPE to guide intensity level when exercising independently       Able to understand and use Dyspnea scale Yes       Intervention Provide education and explanation on how to use Dyspnea scale       Expected Outcomes Short Term: Able to use Dyspnea scale daily in rehab to express subjective sense of shortness of breath during exertion;Long Term: Able to use Dyspnea scale to guide intensity level when exercising independently       Knowledge and understanding of Target Heart Rate Range (THRR) Yes       Intervention Provide education and explanation of THRR including how the numbers were predicted and where they  are located for reference       Expected Outcomes Short Term: Able to state/look up THRR;Short Term: Able to use daily as guideline for intensity in rehab;Long Term: Able to use THRR to govern intensity when exercising independently       Able to check pulse independently Yes       Intervention Provide education and demonstration on how to check pulse in carotid and radial arteries.;Review the importance of being able to check your own pulse for safety during independent exercise       Expected Outcomes Short Term: Able to explain why pulse checking is important during independent exercise;Long Term: Able to check pulse independently and accurately       Understanding of Exercise Prescription Yes       Intervention Provide education, explanation, and written materials on patient's individual exercise prescription       Expected Outcomes Short Term: Able to explain program exercise prescription;Long Term: Able to explain home exercise prescription to exercise independently                Exercise Goals Re-Evaluation :   Discharge Exercise Prescription (Final Exercise Prescription Changes):  Exercise Prescription Changes - 06/12/23 1600       Response to Exercise   Blood Pressure (Admit) 150/90    Blood Pressure (Exercise) 150/80    Blood Pressure (Exit) 112/80    Heart Rate (Admit) 117 bpm    Heart Rate (Exercise) 134 bpm    Heart Rate (Exit) 117 bpm    Oxygen Saturation (Admit) 90 %    Oxygen Saturation (Exercise) 85 %    Oxygen Saturation (Exit) 91 %    Rating of Perceived Exertion (Exercise) 18    Perceived Dyspnea (Exercise) 3    Symptoms Lightheadedness at end of    Comments results             Nutrition:  Target Goals: Understanding of nutrition guidelines, daily intake of sodium 1500mg , cholesterol 200mg , calories 30% from fat and 7% or less from saturated fats, daily to have 5 or more servings of fruits and vegetables.  Education: All About  Nutrition: -Group instruction provided by verbal, written material, interactive activities, discussions, models, and posters to present general guidelines for heart healthy nutrition including fat, fiber, MyPlate, the role of sodium in heart healthy nutrition, utilization of the nutrition label, and utilization of this knowledge for meal planning. Follow up email sent as well. Written material given at graduation. Flowsheet Row Pulmonary Rehab from 08/10/2018 in [facility] Cardiac and Pulmonary Rehab  Date 06/27/18  Educator [facility]  Instruction Review Code 1- Verbalizes Understanding       Biometrics:  Pre Biometrics - 06/12/23 1644       Pre Biometrics   Height 5'  7" (1.702 m)    Weight 187 lb 11.2 oz (85.1 kg)    Waist Circumference 44.5 inches    Hip Circumference 49 inches    Waist to Hip Ratio 0.91 %    BMI (Calculated) 29.39    Single Leg Stand 3.6 seconds              Nutrition Therapy Plan and Nutrition Goals:   Nutrition Assessments:  MEDIFICTS Score Key: ?70 Need to make dietary changes  40-70 Heart Healthy Diet ? 40 Therapeutic Level Cholesterol Diet  Flowsheet Row Pulmonary Rehab from 06/12/2023 in [facility] Cardiac and Pulmonary Rehab  Picture Your Plate Total Score on Admission 64      Picture Your Plate Scores: <69 Unhealthy dietary pattern with much room for improvement. 41-50 Dietary pattern unlikely to meet recommendations for good health and room for improvement. 51-60 More healthful dietary pattern, with some room for improvement.  >60 Healthy dietary pattern, although there may be some specific behaviors that could be improved.   Nutrition Goals Re-Evaluation:   Nutrition Goals Discharge (Final Nutrition Goals Re-Evaluation):   Psychosocial: Target Goals: Acknowledge presence or absence of significant depression and/or stress, maximize coping skills, provide positive support system. Participant is able to verbalize types and ability to use techniques and  skills needed for reducing stress and depression.   Education: Stress, Anxiety, and Depression - Group verbal and visual presentation to define topics covered.  Reviews how body is impacted by stress, anxiety, and depression.  Also discusses healthy ways to reduce stress and to treat/manage anxiety and depression.  Written material given at graduation. Flowsheet Row Pulmonary Rehab from 08/10/2022 in [facility] Cardiac and Pulmonary Rehab  Date 08/10/22  Educator [facility]  Instruction Review Code 1- [employer] Understanding       Education: Sleep Hygiene -Provides group verbal and written instruction about how sleep can affect your health.  Define sleep hygiene, discuss sleep cycles and impact of sleep habits. Review good sleep hygiene tips.  Flowsheet Row Pulmonary Rehab from 08/10/2018 in [facility] Cardiac and Pulmonary Rehab  Date 04/11/18  Educator [facility]  Instruction Review Code 1- Verbalizes Understanding       Initial Review & Psychosocial Screening:  Initial Psych Review & Screening - 04/26/23 1017       Initial Review   Current issues with None Identified      Family Dynamics   Good Support System? Yes   husband, daughters, family     Barriers   Psychosocial barriers to participate in program There are no identifiable barriers or psychosocial needs.;The patient should benefit from training in stress management and relaxation.      Screening Interventions   Interventions Encouraged to exercise;Provide feedback about the scores to participant    Expected Outcomes Short Term goal: Utilizing psychosocial counselor, staff and physician to assist with identification of specific Stressors or current issues interfering with healing process. Setting desired goal for each stressor or current issue identified.;Long Term Goal: Stressors or current issues are controlled or eliminated.;Short Term goal: Identification and review with participant of any Quality of Life or Depression concerns found by  scoring the questionnaire.;Long Term goal: The participant improves quality of Life and PHQ9 Scores as seen by post scores and/or verbalization of changes             Quality of Life Scores:  Scores of 19 and below usually indicate a poorer quality of life in these areas.  A difference of  2-3 points is a  clinically meaningful difference.  A difference of 2-3 points in the total score of the Quality of Life Index has been associated with significant improvement in overall quality of life, self-image, physical symptoms, and general health in studies assessing change in quality of life.  PHQ-9: Review Flowsheet  More data exists      06/12/2023 07/20/2022 06/24/2022 10/13/2021 09/28/2021  Depression screen PHQ 2/9  Decreased Interest 1 0 0 0 0  Down, Depressed, Hopeless 0 0 0 0 0  PHQ - 2 Score 1 0 0 0 0  Altered sleeping 0 1 0 0 0  Tired, decreased energy 2 1 0 0 0  Change in appetite 0 0 0 0 0  Feeling bad or failure about yourself  0 0 0 0 0  Trouble concentrating 0 1 0 0 0  Moving slowly or fidgety/restless 0 0 0 0 0  Suicidal thoughts 0 0 0 0 0  PHQ-9 Score 3 3 0 0 0  Difficult doing work/chores Somewhat difficult Not difficult at all Not difficult at all Not difficult at all Not difficult at all    Details           Interpretation of Total Score  Total Score Depression Severity:  1-4 = Minimal depression, 5-9 = Mild depression, 10-14 = Moderate depression, 15-19 = Moderately severe depression, 20-27 = Severe depression   Psychosocial Evaluation and Intervention:  Psychosocial Evaluation - 04/26/23 1018       Psychosocial Evaluation & Interventions   Interventions Encouraged to exercise with the program and follow exercise prescription    Comments Lynden Ang is returning to pulmonary rehab after having to discharge early in January due to health concerns. Since then, her cardiac work up has been completed with no concerns. She has noticed her breathing has gotten worse  because she has not been as active. She also notes that her legs are weak and her stamina has decreased. She recently fell and doesn't really know if it was related to blacking out with bending over movement or something else. She sees her doctor this Friday. She is motivated to return and wants to be able to keep up with her family. When asked about stress, she states she has a great outlook on life thanks to her family and everyone is doing well.    Expected Outcomes Short: attend pulmonary rehab for education and exercise. Long: develop and maintain positive self care habits.    Continue Psychosocial Services  Follow up required by staff             Psychosocial Re-Evaluation:   Psychosocial Discharge (Final Psychosocial Re-Evaluation):   Education: Education Goals: Education classes will be provided on a weekly basis, covering required topics. Participant will state understanding/return demonstration of topics presented.  Learning Barriers/Preferences:  Learning Barriers/Preferences - 04/26/23 1008       Learning Barriers/Preferences   Learning Barriers None    Learning Preferences None             General Pulmonary Education Topics:  Infection Prevention: - Provides verbal and written material to individual with discussion of infection control including proper hand washing and proper equipment cleaning during exercise session. Flowsheet Row Pulmonary Rehab from 06/12/2023 in [facility] Cardiac and Pulmonary Rehab  Date 06/12/23  Educator MB  Instruction Review Code 1- Verbalizes Understanding       Falls Prevention: - Provides verbal and written material to individual with discussion of falls prevention and safety. Flowsheet Row Pulmonary Rehab from 06/12/2023  in [facility] Cardiac and Pulmonary Rehab  Date 06/12/23  Educator MB  Instruction Review Code 1- Verbalizes Understanding       Chronic Lung Disease Review: - Group verbal instruction with posters, models,  PowerPoint presentations and videos,  to review new updates, new respiratory medications, new advancements in procedures and treatments. Providing information on websites and "800" numbers for continued self-education. Includes information about supplement oxygen, available portable oxygen systems, continuous and intermittent flow rates, oxygen safety, concentrators, and Medicare reimbursement for oxygen. Explanation of Pulmonary Drugs, including class, frequency, complications, importance of spacers, rinsing mouth after steroid MDI's, and proper cleaning methods for nebulizers. Review of basic lung anatomy and physiology related to function, structure, and complications of lung disease. Review of risk factors. Discussion about methods for diagnosing sleep apnea and types of masks and machines for OSA. Includes a review of the use of types of environmental controls: home humidity, furnaces, filters, dust mite/pet prevention, HEPA vacuums. Discussion about weather changes, air quality and the benefits of nasal washing. Instruction on Warning signs, infection symptoms, calling MD promptly, preventive modes, and value of vaccinations. Review of effective airway clearance, coughing and/or vibration techniques. Emphasizing that all should Create an Action Plan. Written material given at graduation. Flowsheet Row Pulmonary Rehab from 08/10/2018 in [facility] Cardiac and Pulmonary Rehab  Date 06/29/18  Educator [facility]  Instruction Review Code 1- Verbalizes Understanding       AED/CPR: - Group verbal and written instruction with the use of models to demonstrate the basic use of the AED with the basic ABC's of resuscitation.    Anatomy and Cardiac Procedures: - Group verbal and visual presentation and models provide information about basic cardiac anatomy and function. Reviews the testing methods done to diagnose heart disease and the outcomes of the test results. Describes the treatment choices: Medical Management,  Angioplasty, or Coronary Bypass Surgery for treating various heart conditions including Myocardial Infarction, Angina, Valve Disease, and Cardiac Arrhythmias.  Written material given at graduation. Flowsheet Row Pulmonary Rehab from 08/10/2018 in [facility] Cardiac and Pulmonary Rehab  Date 05/16/18  Educator [facility]  Instruction Review Code 5- Refused Teaching       Medication Safety: - Group verbal and visual instruction to review commonly prescribed medications for heart and lung disease. Reviews the medication, class of the drug, and side effects. Includes the steps to properly store meds and maintain the prescription regimen.  Written material given at graduation. Flowsheet Row Pulmonary Rehab from 08/10/2018 in [facility] Cardiac and Pulmonary Rehab  Date 08/10/18  Educator [facility]  Instruction Review Code 1- Verbalizes Understanding       Other: -Provides group and verbal instruction on various topics (see comments)   Knowledge Questionnaire Score:  Knowledge Questionnaire Score - 06/12/23 1624       Knowledge Questionnaire Score   Pre Score 18/18              Core Components/Risk Factors/Patient Goals at Admission:  Personal Goals and Risk Factors at Admission - 06/12/23 1646       Core Components/Risk Factors/Patient Goals on Admission    Weight Management Yes;Weight Maintenance;Weight Loss    Intervention Weight Management: Develop a combined nutrition and exercise program designed to reach desired caloric intake, while maintaining appropriate intake of nutrient and fiber, sodium and fats, and appropriate energy expenditure required for the weight goal.;Weight Management: Provide education and appropriate resources to help participant work on and attain dietary goals.;Weight Management/Obesity: Establish reasonable short term and long term weight goals.  Admit Weight 187 lb 11.2 oz (85.1 kg)    Goal Weight: Short Term 172 lb (78 kg)    Goal Weight: Long Term 150 lb (68 kg)     Expected Outcomes Short Term: Continue to assess and modify interventions until short term weight is achieved;Long Term: Adherence to nutrition and physical activity/exercise program aimed toward attainment of established weight goal;Weight Loss: Understanding of general recommendations for a balanced deficit meal plan, which promotes 1-2 lb weight loss per week and includes a negative energy balance of [id number] kcal/d;Understanding recommendations for meals to include 15-35% energy as protein, 25-35% energy from fat, 35-60% energy from carbohydrates, less than 200mg  of dietary cholesterol, 20-35 gm of total fiber daily;Understanding of distribution of calorie intake throughout the day with the consumption of 4-5 meals/snacks    Improve shortness of breath with ADL's Yes    Intervention Provide education, individualized exercise plan and daily activity instruction to help decrease symptoms of SOB with activities of daily living.    Expected Outcomes Short Term: Improve cardiorespiratory fitness to achieve a reduction of symptoms when performing ADLs;Long Term: Be able to perform more ADLs without symptoms or delay the onset of symptoms    Hypertension Yes    Intervention Provide education on lifestyle modifcations including regular physical activity/exercise, weight management, moderate sodium restriction and increased consumption of fresh fruit, vegetables, and low fat dairy, alcohol moderation, and smoking cessation.;Monitor prescription use compliance.    Expected Outcomes Short Term: Continued assessment and intervention until BP is < 140/4mm HG in hypertensive participants. < 130/26mm HG in hypertensive participants with diabetes, heart failure or chronic kidney disease.;Long Term: Maintenance of blood pressure at goal levels.    Lipids Yes    Intervention Provide education and support for participant on nutrition & aerobic/resistive exercise along with prescribed medications to achieve LDL 70mg , HDL  >40mg .    Expected Outcomes Short Term: Participant states understanding of desired cholesterol values and is compliant with medications prescribed. Participant is following exercise prescription and nutrition guidelines.;Long Term: Cholesterol controlled with medications as prescribed, with individualized exercise RX and with personalized nutrition plan. Value goals: LDL < 70mg , HDL > 40 mg.             Education:Diabetes - Individual verbal and written instruction to review signs/symptoms of diabetes, desired ranges of glucose level fasting, after meals and with exercise. Acknowledge that pre and post exercise glucose checks will be done for 3 sessions at entry of program.   Know Your Numbers and Heart Failure: - Group verbal and visual instruction to discuss disease risk factors for cardiac and pulmonary disease and treatment options.  Reviews associated critical values for Overweight/Obesity, Hypertension, Cholesterol, and Diabetes.  Discusses basics of heart failure: signs/symptoms and treatments.  Introduces Heart Failure Zone chart for action plan for heart failure.  Written material given at graduation. Flowsheet Row Pulmonary Rehab from 08/10/2022 in [facility] Cardiac and Pulmonary Rehab  Date 07/27/22  Educator SB  Instruction Review Code 1- Verbalizes Understanding       Core Components/Risk Factors/Patient Goals Review:    Core Components/Risk Factors/Patient Goals at Discharge (Final Review):    ITP Comments:  ITP Comments     Row Name 04/26/23 1016 06/12/23 1633         ITP Comments Initial phone call completed. Diagnosis can be found in [facility] 5/7. EP Orientation scheduled for Wednesday 7/17 at 9:30. Completed and gym orientation. Initial ITP created and sent for review to Vida Rigger, Medical  Director.               Comments: Initial ITP

## 2023-06-13 ENCOUNTER — Encounter: Payer: BC Managed Care – PPO | Admitting: *Deleted

## 2023-06-13 DIAGNOSIS — D860 Sarcoidosis of lung: Secondary | ICD-10-CM | POA: Diagnosis not present

## 2023-06-13 NOTE — Progress Notes (Signed)
Daily Session Note  Patient Details  Name: Stacie [name] MRN: [id number] Date of Birth: January 26, 1956 Referring Provider:   Flowsheet Row Pulmonary Rehab from 06/12/2023 in [facility] Cardiac and Pulmonary Rehab  Referring Provider Vida Rigger       Encounter Date: 06/13/2023  Check In:  Session Check In - 06/13/23 1012       Check-In   Supervising physician immediately available to respond to emergencies See telemetry face sheet for immediately available ER MD    Location [facility]-Cardiac & Pulmonary Rehab    Staff Present Cora Collum, RN, BSN, CCRP;Noah Tickle, BS, Exercise Physiologist;Maxon Conetta BS, , Exercise Physiologist    Virtual Visit No    Medication changes reported     No    Fall or balance concerns reported    No    Warm-up and Cool-down Performed on first and last piece of equipment    Resistance Training Performed Yes    VAD Patient? No    PAD/SET Patient? No      Pain Assessment   Currently in Pain? No/denies                Social History   Tobacco Use  Smoking Status Never  Smokeless Tobacco Never    Goals Met:  Proper associated with RPD/PD & O2 Sat Exercise tolerated well Personal goals reviewed No report of concerns or symptoms today  Goals Unmet:  Not Applicable  Comments: First full day of exercise!  Patient was oriented to gym and equipment including functions, settings, policies, and procedures.  Patient's individual exercise prescription and treatment plan were reviewed.  All starting workloads were established based on the results of the 6 minute walk test done at initial orientation visit.  The plan for exercise progression was also introduced and progression will be customized based on patient's performance and goals.    Dr. Bethann Punches is Medical Director for [facility] Cardiac Rehabilitation.  Dr. Vida Rigger is Medical Director for [facility] Pulmonary Rehabilitation.

## 2023-06-15 ENCOUNTER — Encounter: Payer: BC Managed Care – PPO | Admitting: *Deleted

## 2023-06-15 DIAGNOSIS — D860 Sarcoidosis of lung: Secondary | ICD-10-CM | POA: Diagnosis not present

## 2023-06-15 NOTE — Progress Notes (Signed)
Daily Session Note  Patient Details  Name: AMNAH ESPLIN MRN: [id number] Date of Birth: 09/25/1956 Referring Provider:   Flowsheet Row Pulmonary Rehab from 06/12/2023 in [facility] Cardiac and Pulmonary Rehab  Referring Provider Vida Rigger       Encounter Date: 06/15/2023  Check In:  Session Check In - 06/15/23 1036       Check-In   Supervising physician immediately available to respond to emergencies See telemetry face sheet for immediately available ER MD    Location [facility]-Cardiac & Pulmonary Rehab    Staff Present Ronette Deter, BS, Exercise Physiologist;Margaret Best, MS, Exercise Physiologist;Mayana Irigoyen Katrinka Blazing, RN, ADN    Virtual Visit No    Medication changes reported     No    Fall or balance concerns reported    No    Warm-up and Cool-down Performed on first and last piece of equipment    Resistance Training Performed No   shoulder pain; continued T6 longer   VAD Patient? No    PAD/SET Patient? No      Pain Assessment   Currently in Pain? No/denies                Social History   Tobacco Use  Smoking Status Never  Smokeless Tobacco Never    Goals Met:  Independence with exercise equipment Exercise tolerated well No report of concerns or symptoms today Strength training completed today  Goals Unmet:  Not Applicable  Comments: Pt able to follow exercise prescription today without complaint.  Will continue to monitor for progression.    Dr. Bethann Punches is Medical Director for [facility] Cardiac Rehabilitation.  Dr. Vida Rigger is Medical Director for [facility] Pulmonary Rehabilitation.

## 2023-06-20 ENCOUNTER — Encounter: Payer: BC Managed Care – PPO | Admitting: *Deleted

## 2023-06-21 ENCOUNTER — Encounter: Payer: Self-pay | Admitting: *Deleted

## 2023-06-21 DIAGNOSIS — D860 Sarcoidosis of lung: Secondary | ICD-10-CM

## 2023-06-21 NOTE — Progress Notes (Signed)
Pulmonary Individual Treatment Plan  Patient Details  Name: Stacie [name] MRN: [id number] Date of Birth: 04/13/1956 Referring Provider:   Flowsheet Row Pulmonary Rehab from 06/12/2023 in [facility] Cardiac and Pulmonary Rehab  Referring Provider Vida Rigger       Initial Encounter Date:  Flowsheet Row Pulmonary Rehab from 06/12/2023 in [facility] Cardiac and Pulmonary Rehab  Date 06/12/23       Visit Diagnosis: Sarcoidosis of lung (HCC)  Patient's Home Medications on Admission:  Current Outpatient Medications:    acetaminophen (TYLENOL) 500 MG tablet, Take 500 mg by mouth every 6 (six) hours as needed for moderate pain or headache., Disp: , Rfl:    albuterol (ACCUNEB) 1.25 MG/3ML nebulizer solution, Take 1 ampule by nebulization as needed., Disp: , Rfl:    azithromycin (ZITHROMAX) 250 MG tablet, Take 250 mg by mouth 3 (three) times a week., Disp: , Rfl:    budesonide (PULMICORT) 0.25 MG/2ML nebulizer solution, Take 0.25 mg by nebulization daily as needed., Disp: , Rfl:    denosumab (PROLIA) 60 MG/ML SOSY injection, Inject 60 mg into the skin every 6 (six) months., Disp: , Rfl:    dorzolamide (TRUSOPT) 2 % ophthalmic solution, Place 1 drop into both eyes 2 (two) times daily., Disp: , Rfl:    escitalopram (LEXAPRO) 20 MG tablet, Take 1 tablet (20 mg total) by mouth daily., Disp: 90 tablet, Rfl: 3   Fluticasone-Umeclidin-Vilant (TRELEGY ELLIPTA) 200-62.5-25 MCG/ACT AEPB, Inhale into the lungs daily., Disp: , Rfl:    latanoprost (XALATAN) 0.005 % ophthalmic solution, Place 1 drop into the right eye 2 (two) times daily., Disp: , Rfl:    levothyroxine (SYNTHROID) 137 MCG tablet, Take 1 tablet (137 mcg total) by mouth daily before breakfast., Disp: 90 tablet, Rfl: 3   Multiple Vitamins-Minerals (MULTIVITAMIN WITH MINERALS) tablet, Take 1 tablet by mouth daily., Disp: , Rfl:    naproxen sodium (ALEVE) 220 MG tablet, Take 220 mg by mouth daily as needed (pain)., Disp: , Rfl:    NON FORMULARY, CPAP  nightly, Disp: , Rfl:    OXYGEN, Inhale 2 L into the lungs daily as needed (During walking and activity). , Disp: , Rfl:    predniSONE (DELTASONE) 10 MG tablet, Take 2 tablets (20 mg total) by mouth daily. Resume only after the finishing new Rx for prednisone (Patient taking differently: Take 13 mg by mouth daily.), Disp: , Rfl:    Probiotic Product (PROBIOTIC PO), Take 1 capsule by mouth daily., Disp: , Rfl:    Treprostinil (TYVASO) 0.6 MG/ML SOLN, Inhale into the lungs See admin instructions. 12 breaths 4 times a day, Disp: , Rfl:   Past Medical History: Past Medical History:  Diagnosis Date   Acute renal failure (HCC)    Acute respiratory failure (HCC) 10/23/2021   Acute respiratory failure with hypoxia (HCC)    ARF (acute respiratory failure) (HCC) 10/19/2021   Arrhythmia    patient unaware if this is current   Asthma    Chronic kidney disease    Critical lower limb ischemia (HCC)    Depression    GERD (gastroesophageal reflux disease)    Heart murmur    History of kidney stones    HOH (hard of hearing)    wear aids   Hyperthyroidism    Hypothyroidism    IBS (irritable bowel syndrome)    Pneumonia    Pulmonary hypertension (HCC)    Sarcoid    Sarcoidosis    Seasonal allergies    Sleep apnea CPAP with  O2   Stroke (HCC) 07/2020   watershed   Wears hearing aid in both ears     Tobacco Use: Social History   Tobacco Use  Smoking Status Never  Smokeless Tobacco Never    Labs: Review Flowsheet  More data exists      Latest Ref Rng & Units 07/21/2020 01/14/2021 10/19/2021 06/24/2022 12/23/2022  Labs for ITP Cardiac and Pulmonary Rehab  Cholestrol 0 - 200 mg/dL 191  - - - -  LDL (calc) 0 - 99 mg/dL 478  - - - -  HDL-C >29 mg/dL 33  - - - -  Trlycerides <150 mg/dL 562  - - - -  Hemoglobin A1c 4.8 - 5.6 % - - - 6.1  -  PH, Arterial 7.35 - 7.45 - - - - 7.413   PCO2 arterial 32 - 48 mmHg - - - - 44.3   Bicarbonate 20.0 - 28.0 mmol/L - - 31.7  - 28.3  30.0   TCO2 22 - 32 mmol/L  - 30  - - 30  32   O2 Saturation % - - 33.6  - 95  65     Details       Multiple values from one day are sorted in reverse-chronological order          Pulmonary Assessment Scores:  Pulmonary Assessment Scores     Row Name 06/12/23 1620         ADL UCSD   ADL Phase Entry     SOB Score total 91     Rest 1     Walk 3     Stairs 5     Bath 3     Dress 4     Shop 5       CAT Score   CAT Score 18       mMRC Score   mMRC Score 3              UCSD: Self-administered rating of dyspnea associated with activities of daily living (ADLs) 6-point scale (0 = "not at all" to 5 = "maximal or unable to do because of breathlessness")  Scoring Scores range from 0 to 120.  Minimally important difference is 5 units  CAT: CAT can identify the health impairment of COPD patients and is better correlated with disease progression.  CAT has a scoring range of zero to 40. The CAT score is classified into four groups of low (less than 10), medium (10 - 20), high (21-30) and very high (31-40) based on the impact level of disease on health status. A CAT score over 10 suggests significant symptoms.  A worsening CAT score could be explained by an exacerbation, poor medication adherence, poor inhaler technique, or progression of COPD or comorbid conditions.  CAT MCID is 2 points  mMRC: mMRC (Modified Medical Research Council) Dyspnea Scale is used to assess the degree of baseline functional disability in patients of respiratory disease due to dyspnea. No minimal important difference is established. A decrease in score of 1 point or greater is considered a positive change.   Pulmonary Function Assessment:   Exercise Target Goals: Exercise Program Goal: Individual exercise prescription set using results from initial 6 min walk test and THRR while considering  patient's activity barriers and safety.   Exercise Prescription Goal: Initial exercise prescription builds to 30-45 minutes a day of  aerobic activity, 2-3 days per week.  Home exercise guidelines will be given to patient during program as part  of exercise prescription that the participant will acknowledge.  Education: Aerobic Exercise: - Group verbal and visual presentation on the components of exercise prescription. Introduces F.I.T.T principle from ACSM for exercise prescriptions.  Reviews F.I.T.T. principles of aerobic exercise including progression. Written material given at graduation. Flowsheet Row Pulmonary Rehab from 08/10/2018 in [facility] Cardiac and Pulmonary Rehab  Date 05/02/18  Educator [facility]  Instruction Review Code 1- Verbalizes Understanding       Education: Resistance Exercise: - Group verbal and visual presentation on the components of exercise prescription. Introduces F.I.T.T principle from ACSM for exercise prescriptions  Reviews F.I.T.T. principles of resistance exercise including progression. Written material given at graduation.    Education: Exercise & Equipment Safety: - Individual verbal instruction and demonstration of equipment use and safety with use of the equipment. Flowsheet Row Pulmonary Rehab from 06/12/2023 in [facility] Cardiac and Pulmonary Rehab  Date 06/12/23  Educator MB  Instruction Review Code 1- Verbalizes Understanding       Education: Exercise Physiology & General Exercise Guidelines: - Group verbal and written instruction with models to review the exercise physiology of the cardiovascular system and associated critical values. Provides general exercise guidelines with specific guidelines to those with heart or lung disease.  Flowsheet Row Pulmonary Rehab from 08/10/2018 in [facility] Cardiac and Pulmonary Rehab  Date 07/11/18  Educator [facility]  Instruction Review Code 1- Verbalizes Understanding       Education: Flexibility, Balance, Mind/Body Relaxation: - Group verbal and visual presentation with interactive activity on the components of exercise prescription. Introduces F.I.T.T  principle from ACSM for exercise prescriptions. Reviews F.I.T.T. principles of flexibility and balance exercise training including progression. Also discusses the mind body connection.  Reviews various relaxation techniques to help reduce and manage stress (i.e. Deep breathing, progressive muscle relaxation, and visualization). Balance handout provided to take home. Written material given at graduation. Flowsheet Row Pulmonary Rehab from 08/10/2018 in [facility] Cardiac and Pulmonary Rehab  Date 08/08/18  Educator AS  Instruction Review Code 1- Verbalizes Understanding       Activity Barriers & Risk Stratification:  Activity Barriers & Cardiac Risk Stratification - 06/12/23 1637       Activity Barriers & Cardiac Risk Stratification   Activity Barriers Muscular Weakness;Balance Concerns;Other (comment);Deconditioning    Comments Healing from broken left collarbone             6 Minute Walk:  6 Minute Walk     Row Name 06/12/23 1634         6 Minute Walk   Phase Initial     Distance 600 feet     Walk Time 4.77 minutes     # of Rest Breaks 1     MPH 1.43     METS 2.19     RPE 18     Perceived Dyspnea  3     VO2 Peak 7.69     Symptoms Yes (comment)     Comments Lightheadedness at the end of test     Resting HR 117 bpm     Resting BP 150/90     Resting Oxygen Saturation  90 %     Exercise Oxygen Saturation  during 6 min walk 85 %     Max Ex. HR 134 bpm     Max Ex. BP 150/80     2 Minute Post BP 148/80       Interval HR   1 Minute HR 118     2 Minute HR 127  3 Minute HR 128     4 Minute HR 124     5 Minute HR 88     6 Minute HR 134     2 Minute Post HR 116     Interval Heart Rate? Yes       Interval Oxygen   Interval Oxygen? Yes     Baseline Oxygen Saturation % 90 %     1 Minute Oxygen Saturation % 87 %     1 Minute Liters of Oxygen 2 L     2 Minute Oxygen Saturation % 86 %     2 Minute Liters of Oxygen 2 L     3 Minute Oxygen Saturation % 85 %     3 Minute  Liters of Oxygen 2 L     4 Minute Oxygen Saturation % 87 %     4 Minute Liters of Oxygen 2 L     5 Minute Oxygen Saturation % 86 %     5 Minute Liters of Oxygen 2 L     6 Minute Oxygen Saturation % 85 %     6 Minute Liters of Oxygen 2 L     2 Minute Post Oxygen Saturation % 93 %     2 Minute Post Liters of Oxygen 2 L             Oxygen Initial Assessment:  Oxygen Initial Assessment - 04/26/23 1002       Home Oxygen   Home Oxygen Device Portable Concentrator;Home Concentrator    Sleep Oxygen Prescription CPAP    Liters per minute 2    Home Exercise Oxygen Prescription Continuous    Liters per minute 2    Home Resting Oxygen Prescription Continuous    Liters per minute 2    Compliance with Home Oxygen Use Yes      Intervention   Short Term Goals To learn and exhibit compliance with exercise, home and travel O2 prescription;To learn and understand importance of monitoring SPO2 with pulse oximeter and demonstrate accurate use of the pulse oximeter.;To learn and understand importance of maintaining oxygen saturations>88%;To learn and demonstrate proper pursed lip breathing techniques or other breathing techniques. ;To learn and demonstrate proper use of respiratory medications    Long  Term Goals Exhibits compliance with exercise, home  and travel O2 prescription;Verbalizes importance of monitoring SPO2 with pulse oximeter and return demonstration;Maintenance of O2 saturations>88%;Exhibits proper breathing techniques, such as pursed lip breathing or other method taught during program session;Compliance with respiratory medication;Demonstrates proper use of MDI's             Oxygen Re-Evaluation:  Oxygen Re-Evaluation     Row Name 06/13/23 1015             Program Oxygen Prescription   Program Oxygen Prescription Continuous       Liters per minute 2         Home Oxygen   Home Oxygen Device Portable Concentrator;Home Concentrator       Sleep Oxygen Prescription CPAP        Liters per minute 2       Home Exercise Oxygen Prescription Continuous       Liters per minute 2       Home Resting Oxygen Prescription Continuous       Liters per minute 2       Compliance with Home Oxygen Use Yes         Goals/Expected Outcomes   Short  Term Goals To learn and demonstrate proper pursed lip breathing techniques or other breathing techniques.        Long  Term Goals Exhibits proper breathing techniques, such as pursed lip breathing or other method taught during program session       Comments Reviewed PLB technique with pt.  Talked about how it works and it's importance in maintaining their exercise saturations.       Goals/Expected Outcomes Short: Become more profiecient at using PLB. Long: Become independent at using PLB.                Oxygen Discharge (Final Oxygen Re-Evaluation):  Oxygen Re-Evaluation - 06/13/23 1015       Program Oxygen Prescription   Program Oxygen Prescription Continuous    Liters per minute 2      Home Oxygen   Home Oxygen Device Portable Concentrator;Home Concentrator    Sleep Oxygen Prescription CPAP    Liters per minute 2    Home Exercise Oxygen Prescription Continuous    Liters per minute 2    Home Resting Oxygen Prescription Continuous    Liters per minute 2    Compliance with Home Oxygen Use Yes      Goals/Expected Outcomes   Short Term Goals To learn and demonstrate proper pursed lip breathing techniques or other breathing techniques.     Long  Term Goals Exhibits proper breathing techniques, such as pursed lip breathing or other method taught during program session    Comments Reviewed PLB technique with pt.  Talked about how it works and it's importance in maintaining their exercise saturations.    Goals/Expected Outcomes Short: Become more profiecient at using PLB. Long: Become independent at using PLB.             Initial Exercise Prescription:  Initial Exercise Prescription - 06/12/23 1600       Date of  Initial Exercise RX and Referring Provider   Date 06/12/23    Referring Provider Vida Rigger      Oxygen   Oxygen Continuous    Liters 2    Maintain Oxygen Saturation 88% or higher      Treadmill   MPH 1.2    Grade 0    Minutes 15    METs 1.92      NuStep   Level 1   T6   SPM 80    Minutes 15      Biostep-RELP   Level 1    SPM 50    Minutes 15      Track   Laps 14   Hallway   Minutes 15    METs 1.76      Prescription Details   Frequency (times per week) 2    Duration Progress to 30 minutes of continuous aerobic without signs/symptoms of physical distress      Intensity   THRR 40-80% of Max Heartrate 131-145    Ratings of Perceived Exertion 11-13    Perceived Dyspnea 0-4      Progression   Progression Continue to progress workloads to maintain intensity without signs/symptoms of physical distress.      Resistance Training   Training Prescription Yes    Weight 2lb    Reps 10-15             Perform Capillary Blood Glucose checks as needed.  Exercise Prescription Changes:   Exercise Prescription Changes     Row Name 06/12/23 1600  Response to Exercise   Blood Pressure (Admit) 150/90       Blood Pressure (Exercise) 150/80       Blood Pressure (Exit) 112/80       Heart Rate (Admit) 117 bpm       Heart Rate (Exercise) 134 bpm       Heart Rate (Exit) 117 bpm       Oxygen Saturation (Admit) 90 %       Oxygen Saturation (Exercise) 85 %       Oxygen Saturation (Exit) 91 %       Rating of Perceived Exertion (Exercise) 18       Perceived Dyspnea (Exercise) 3       Symptoms Lightheadedness at end of       Comments results                Exercise Comments:   Exercise Comments     Row Name 06/13/23 1015           Exercise Comments First full day of exercise!  Patient was oriented to gym and equipment including functions, settings, policies, and procedures.  Patient's individual exercise prescription and treatment  plan were reviewed.  All starting workloads were established based on the results of the 6 minute walk test done at initial orientation visit.  The plan for exercise progression was also introduced and progression will be customized based on patient's performance and goals.                Exercise Goals and Review:   Exercise Goals     Row Name 06/12/23 1643             Exercise Goals   Increase Physical Activity Yes       Intervention Provide advice, education, support and counseling about physical activity/exercise needs.;Develop an individualized exercise prescription for aerobic and resistive training based on initial evaluation findings, risk stratification, comorbidities and participant's personal goals.       Expected Outcomes Short Term: Attend rehab on a regular basis to increase amount of physical activity.;Long Term: Exercising regularly at least 3-5 days a week.;Long Term: Add in home exercise to make exercise part of routine and to increase amount of physical activity.       Increase Strength and Stamina Yes       Intervention Provide advice, education, support and counseling about physical activity/exercise needs.;Develop an individualized exercise prescription for aerobic and resistive training based on initial evaluation findings, risk stratification, comorbidities and participant's personal goals.       Expected Outcomes Short Term: Increase workloads from initial exercise prescription for resistance, speed, and METs.;Short Term: Perform resistance training exercises routinely during rehab and add in resistance training at home;Long Term: Improve cardiorespiratory fitness, muscular endurance and strength as measured by increased METs and functional capacity ( )       Able to understand and use rate of perceived exertion (RPE) scale Yes       Intervention Provide education and explanation on how to use RPE scale       Expected Outcomes Short Term: Able to use RPE daily  in rehab to express subjective intensity level;Long Term:  Able to use RPE to guide intensity level when exercising independently       Able to understand and use Dyspnea scale Yes       Intervention Provide education and explanation on how to use Dyspnea scale       Expected Outcomes  Short Term: Able to use Dyspnea scale daily in rehab to express subjective sense of shortness of breath during exertion;Long Term: Able to use Dyspnea scale to guide intensity level when exercising independently       Knowledge and understanding of Target Heart Rate Range (THRR) Yes       Intervention Provide education and explanation of THRR including how the numbers were predicted and where they are located for reference       Expected Outcomes Short Term: Able to state/look up THRR;Short Term: Able to use daily as guideline for intensity in rehab;Long Term: Able to use THRR to govern intensity when exercising independently       Able to check pulse independently Yes       Intervention Provide education and demonstration on how to check pulse in carotid and radial arteries.;Review the importance of being able to check your own pulse for safety during independent exercise       Expected Outcomes Short Term: Able to explain why pulse checking is important during independent exercise;Long Term: Able to check pulse independently and accurately       Understanding of Exercise Prescription Yes       Intervention Provide education, explanation, and written materials on patient's individual exercise prescription       Expected Outcomes Short Term: Able to explain program exercise prescription;Long Term: Able to explain home exercise prescription to exercise independently                Exercise Goals Re-Evaluation :  Exercise Goals Re-Evaluation     Row Name 06/13/23 1016             Exercise Goal Re-Evaluation   Exercise Goals Review Understanding of Exercise Prescription;Knowledge and understanding of Target  Heart Rate Range (THRR);Able to understand and use Dyspnea scale;Able to understand and use rate of perceived exertion (RPE) scale       Comments Reviewed RPE  and dyspnea scale, THR and program prescription with pt today.  Pt voiced understanding and was given a copy of goals to take home.       Short: Use RPE daily to regulate intensity.  Long: Follow program prescription in THR.       Expected Outcomes Short: Use RPE daily to regulate intensity. Long: Follow program prescription in THR.                Discharge Exercise Prescription (Final Exercise Prescription Changes):  Exercise Prescription Changes - 06/12/23 1600       Response to Exercise   Blood Pressure (Admit) 150/90    Blood Pressure (Exercise) 150/80    Blood Pressure (Exit) 112/80    Heart Rate (Admit) 117 bpm    Heart Rate (Exercise) 134 bpm    Heart Rate (Exit) 117 bpm    Oxygen Saturation (Admit) 90 %    Oxygen Saturation (Exercise) 85 %    Oxygen Saturation (Exit) 91 %    Rating of Perceived Exertion (Exercise) 18    Perceived Dyspnea (Exercise) 3    Symptoms Lightheadedness at end of    Comments results             Nutrition:  Target Goals: Understanding of nutrition guidelines, daily intake of sodium 1500mg , cholesterol 200mg , calories 30% from fat and 7% or less from saturated fats, daily to have 5 or more servings of fruits and vegetables.  Education: All About Nutrition: -Group instruction provided by verbal, written material, interactive activities, discussions,  models, and posters to present general guidelines for heart healthy nutrition including fat, fiber, MyPlate, the role of sodium in heart healthy nutrition, utilization of the nutrition label, and utilization of this knowledge for meal planning. Follow up email sent as well. Written material given at graduation. Flowsheet Row Pulmonary Rehab from 08/10/2018 in [facility] Cardiac and Pulmonary Rehab  Date 06/27/18  Educator LB   Instruction Review Code 1- Verbalizes Understanding       Biometrics:  Pre Biometrics - 06/12/23 1644       Pre Biometrics   Height 5\' 7"  (1.702 m)    Weight 187 lb 11.2 oz (85.1 kg)    Waist Circumference 44.5 inches    Hip Circumference 49 inches    Waist to Hip Ratio 0.91 %    BMI (Calculated) 29.39    Single Leg Stand 3.6 seconds              Nutrition Therapy Plan and Nutrition Goals:   Nutrition Assessments:  MEDIFICTS Score Key: ?70 Need to make dietary changes  40-70 Heart Healthy Diet ? 40 Therapeutic Level Cholesterol Diet  Flowsheet Row Pulmonary Rehab from 06/12/2023 in [facility] Cardiac and Pulmonary Rehab  Picture Your Plate Total Score on Admission 64      Picture Your Plate Scores: <16 Unhealthy dietary pattern with much room for improvement. 41-50 Dietary pattern unlikely to meet recommendations for good health and room for improvement. 51-60 More healthful dietary pattern, with some room for improvement.  >60 Healthy dietary pattern, although there may be some specific behaviors that could be improved.   Nutrition Goals Re-Evaluation:   Nutrition Goals Discharge (Final Nutrition Goals Re-Evaluation):   Psychosocial: Target Goals: Acknowledge presence or absence of significant depression and/or stress, maximize coping skills, provide positive support system. Participant is able to verbalize types and ability to use techniques and skills needed for reducing stress and depression.   Education: Stress, Anxiety, and Depression - Group verbal and visual presentation to define topics covered.  Reviews how body is impacted by stress, anxiety, and depression.  Also discusses healthy ways to reduce stress and to treat/manage anxiety and depression.  Written material given at graduation. Flowsheet Row Pulmonary Rehab from 08/10/2022 in [facility] Cardiac and Pulmonary Rehab  Date 08/10/22  Educator [facility]  Instruction Review Code 1- [employer] Understanding        Education: Sleep Hygiene -Provides group verbal and written instruction about how sleep can affect your health.  Define sleep hygiene, discuss sleep cycles and impact of sleep habits. Review good sleep hygiene tips.  Flowsheet Row Pulmonary Rehab from 08/10/2018 in [facility] Cardiac and Pulmonary Rehab  Date 04/11/18  Educator [facility]  Instruction Review Code 1- Verbalizes Understanding       Initial Review & Psychosocial Screening:  Initial Psych Review & Screening - 04/26/23 1017       Initial Review   Current issues with None Identified      Family Dynamics   Good Support System? Yes   husband, daughters, family     Barriers   Psychosocial barriers to participate in program There are no identifiable barriers or psychosocial needs.;The patient should benefit from training in stress management and relaxation.      Screening Interventions   Interventions Encouraged to exercise;Provide feedback about the scores to participant    Expected Outcomes Short Term goal: Utilizing psychosocial counselor, staff and physician to assist with identification of specific Stressors or current issues interfering with healing process. Setting desired goal for  each stressor or current issue identified.;Long Term Goal: Stressors or current issues are controlled or eliminated.;Short Term goal: Identification and review with participant of any Quality of Life or Depression concerns found by scoring the questionnaire.;Long Term goal: The participant improves quality of Life and PHQ9 Scores as seen by post scores and/or verbalization of changes             Quality of Life Scores:  Scores of 19 and below usually indicate a poorer quality of life in these areas.  A difference of  2-3 points is a clinically meaningful difference.  A difference of 2-3 points in the total score of the Quality of Life Index has been associated with significant improvement in overall quality of life, self-image, physical symptoms,  and general health in studies assessing change in quality of life.  PHQ-9: Review Flowsheet  More data exists      06/12/2023 07/20/2022 06/24/2022 10/13/2021 09/28/2021  Depression screen PHQ 2/9  Decreased Interest 1 0 0 0 0  Down, Depressed, Hopeless 0 0 0 0 0  PHQ - 2 Score 1 0 0 0 0  Altered sleeping 0 1 0 0 0  Tired, decreased energy 2 1 0 0 0  Change in appetite 0 0 0 0 0  Feeling bad or failure about yourself  0 0 0 0 0  Trouble concentrating 0 1 0 0 0  Moving slowly or fidgety/restless 0 0 0 0 0  Suicidal thoughts 0 0 0 0 0  PHQ-9 Score 3 3 0 0 0  Difficult doing work/chores Somewhat difficult Not difficult at all Not difficult at all Not difficult at all Not difficult at all    Details           Interpretation of Total Score  Total Score Depression Severity:  1-4 = Minimal depression, 5-9 = Mild depression, 10-14 = Moderate depression, 15-19 = Moderately severe depression, 20-27 = Severe depression   Psychosocial Evaluation and Intervention:  Psychosocial Evaluation - 04/26/23 1018       Psychosocial Evaluation & Interventions   Interventions Encouraged to exercise with the program and follow exercise prescription    Comments Lynden Ang is returning to pulmonary rehab after having to discharge early in January due to health concerns. Since then, her cardiac work up has been completed with no concerns. She has noticed her breathing has gotten worse because she has not been as active. She also notes that her legs are weak and her stamina has decreased. She recently fell and doesn't really know if it was related to blacking out with bending over movement or something else. She sees her doctor this Friday. She is motivated to return and wants to be able to keep up with her family. When asked about stress, she states she has a great outlook on life thanks to her family and everyone is doing well.    Expected Outcomes Short: attend pulmonary rehab for education and exercise. Long:  develop and maintain positive self care habits.    Continue Psychosocial Services  Follow up required by staff             Psychosocial Re-Evaluation:   Psychosocial Discharge (Final Psychosocial Re-Evaluation):   Education: Education Goals: Education classes will be provided on a weekly basis, covering required topics. Participant will state understanding/return demonstration of topics presented.  Learning Barriers/Preferences:  Learning Barriers/Preferences - 04/26/23 1008       Learning Barriers/Preferences   Learning Barriers None    Learning Preferences  None             General Pulmonary Education Topics:  Infection Prevention: - Provides verbal and written material to individual with discussion of infection control including proper hand washing and proper equipment cleaning during exercise session. Flowsheet Row Pulmonary Rehab from 06/12/2023 in [facility] Cardiac and Pulmonary Rehab  Date 06/12/23  Educator MB  Instruction Review Code 1- Verbalizes Understanding       Falls Prevention: - Provides verbal and written material to individual with discussion of falls prevention and safety. Flowsheet Row Pulmonary Rehab from 06/12/2023 in [facility] Cardiac and Pulmonary Rehab  Date 06/12/23  Educator MB  Instruction Review Code 1- Verbalizes Understanding       Chronic Lung Disease Review: - Group verbal instruction with posters, models, PowerPoint presentations and videos,  to review new updates, new respiratory medications, new advancements in procedures and treatments. Providing information on websites and "800" numbers for continued self-education. Includes information about supplement oxygen, available portable oxygen systems, continuous and intermittent flow rates, oxygen safety, concentrators, and Medicare reimbursement for oxygen. Explanation of Pulmonary Drugs, including class, frequency, complications, importance of spacers, rinsing mouth after steroid MDI's, and  proper cleaning methods for nebulizers. Review of basic lung anatomy and physiology related to function, structure, and complications of lung disease. Review of risk factors. Discussion about methods for diagnosing sleep apnea and types of masks and machines for OSA. Includes a review of the use of types of environmental controls: home humidity, furnaces, filters, dust mite/pet prevention, HEPA vacuums. Discussion about weather changes, air quality and the benefits of nasal washing. Instruction on Warning signs, infection symptoms, calling MD promptly, preventive modes, and value of vaccinations. Review of effective airway clearance, coughing and/or vibration techniques. Emphasizing that all should Create an Action Plan. Written material given at graduation. Flowsheet Row Pulmonary Rehab from 08/10/2018 in [facility] Cardiac and Pulmonary Rehab  Date 06/29/18  Educator [facility]  Instruction Review Code 1- Verbalizes Understanding       AED/CPR: - Group verbal and written instruction with the use of models to demonstrate the basic use of the AED with the basic ABC's of resuscitation.    Anatomy and Cardiac Procedures: - Group verbal and visual presentation and models provide information about basic cardiac anatomy and function. Reviews the testing methods done to diagnose heart disease and the outcomes of the test results. Describes the treatment choices: Medical Management, Angioplasty, or Coronary Bypass Surgery for treating various heart conditions including Myocardial Infarction, Angina, Valve Disease, and Cardiac Arrhythmias.  Written material given at graduation. Flowsheet Row Pulmonary Rehab from 08/10/2018 in [facility] Cardiac and Pulmonary Rehab  Date 05/16/18  Educator [facility]  Instruction Review Code 5- Refused Teaching       Medication Safety: - Group verbal and visual instruction to review commonly prescribed medications for heart and lung disease. Reviews the medication, class of the drug, and side  effects. Includes the steps to properly store meds and maintain the prescription regimen.  Written material given at graduation. Flowsheet Row Pulmonary Rehab from 08/10/2018 in [facility] Cardiac and Pulmonary Rehab  Date 08/10/18  Educator [facility]  Instruction Review Code 1- Verbalizes Understanding       Other: -Provides group and verbal instruction on various topics (see comments)   Knowledge Questionnaire Score:  Knowledge Questionnaire Score - 06/12/23 1624       Knowledge Questionnaire Score   Pre Score 18/18              Core Components/Risk Factors/Patient  Goals at Admission:  Personal Goals and Risk Factors at Admission - 06/12/23 1646       Core Components/Risk Factors/Patient Goals on Admission    Weight Management Yes;Weight Maintenance;Weight Loss    Intervention Weight Management: Develop a combined nutrition and exercise program designed to reach desired caloric intake, while maintaining appropriate intake of nutrient and fiber, sodium and fats, and appropriate energy expenditure required for the weight goal.;Weight Management: Provide education and appropriate resources to help participant work on and attain dietary goals.;Weight Management/Obesity: Establish reasonable short term and long term weight goals.    Admit Weight 187 lb 11.2 oz (85.1 kg)    Goal Weight: Short Term 172 lb (78 kg)    Goal Weight: Long Term 150 lb (68 kg)    Expected Outcomes Short Term: Continue to assess and modify interventions until short term weight is achieved;Long Term: Adherence to nutrition and physical activity/exercise program aimed toward attainment of established weight goal;Weight Loss: Understanding of general recommendations for a balanced deficit meal plan, which promotes 1-2 lb weight loss per week and includes a negative energy balance of [phone] kcal/d;Understanding recommendations for meals to include 15-35% energy as protein, 25-35% energy from fat, 35-60% energy from  carbohydrates, less than 200mg  of dietary cholesterol, 20-35 gm of total fiber daily;Understanding of distribution of calorie intake throughout the day with the consumption of 4-5 meals/snacks    Improve shortness of breath with ADL's Yes    Intervention Provide education, individualized exercise plan and daily activity instruction to help decrease symptoms of SOB with activities of daily living.    Expected Outcomes Short Term: Improve cardiorespiratory fitness to achieve a reduction of symptoms when performing ADLs;Long Term: Be able to perform more ADLs without symptoms or delay the onset of symptoms    Hypertension Yes    Intervention Provide education on lifestyle modifcations including regular physical activity/exercise, weight management, moderate sodium restriction and increased consumption of fresh fruit, vegetables, and low fat dairy, alcohol moderation, and smoking cessation.;Monitor prescription use compliance.    Expected Outcomes Short Term: Continued assessment and intervention until BP is < 140/29mm HG in hypertensive participants. < 130/74mm HG in hypertensive participants with diabetes, heart failure or chronic kidney disease.;Long Term: Maintenance of blood pressure at goal levels.    Lipids Yes    Intervention Provide education and support for participant on nutrition & aerobic/resistive exercise along with prescribed medications to achieve LDL 70mg , HDL >40mg .    Expected Outcomes Short Term: Participant states understanding of desired cholesterol values and is compliant with medications prescribed. Participant is following exercise prescription and nutrition guidelines.;Long Term: Cholesterol controlled with medications as prescribed, with individualized exercise RX and with personalized nutrition plan. Value goals: LDL < 70mg , HDL > 40 mg.             Education:Diabetes - Individual verbal and written instruction to review signs/symptoms of diabetes, desired ranges of  glucose level fasting, after meals and with exercise. Acknowledge that pre and post exercise glucose checks will be done for 3 sessions at entry of program.   Know Your Numbers and Heart Failure: - Group verbal and visual instruction to discuss disease risk factors for cardiac and pulmonary disease and treatment options.  Reviews associated critical values for Overweight/Obesity, Hypertension, Cholesterol, and Diabetes.  Discusses basics of heart failure: signs/symptoms and treatments.  Introduces Heart Failure Zone chart for action plan for heart failure.  Written material given at graduation. Flowsheet Row Pulmonary Rehab from 08/10/2022 in [facility] Cardiac  and Pulmonary Rehab  Date 07/27/22  Educator SB  Instruction Review Code 1- Verbalizes Understanding       Core Components/Risk Factors/Patient Goals Review:    Core Components/Risk Factors/Patient Goals at Discharge (Final Review):    ITP Comments:  ITP Comments     Row Name 04/26/23 1016 06/12/23 1633 06/13/23 1015 06/21/23 1042     ITP Comments Initial phone call completed. Diagnosis can be found in [facility] 5/7. EP Orientation scheduled for Wednesday 7/17 at 9:30. Completed and gym orientation. Initial ITP created and sent for review to Vida Rigger, Medical Director. First full day of exercise!  Patient was oriented to gym and equipment including functions, settings, policies, and procedures.  Patient's individual exercise prescription and treatment plan were reviewed.  All starting workloads were established based on the results of the 6 minute walk test done at initial orientation visit.  The plan for exercise progression was also introduced and progression will be customized based on patient's performance and goals. 30 Day review completed. Medical Director ITP review done, changes made as directed, and signed approval by Medical Director.     new to program             Comments:

## 2023-06-22 ENCOUNTER — Encounter: Payer: BC Managed Care – PPO | Attending: Pulmonary Disease | Admitting: *Deleted

## 2023-06-22 DIAGNOSIS — I272 Pulmonary hypertension, unspecified: Secondary | ICD-10-CM | POA: Insufficient documentation

## 2023-06-22 DIAGNOSIS — D860 Sarcoidosis of lung: Secondary | ICD-10-CM | POA: Insufficient documentation

## 2023-06-27 ENCOUNTER — Encounter: Payer: BC Managed Care – PPO | Admitting: *Deleted

## 2023-06-27 DIAGNOSIS — D860 Sarcoidosis of lung: Secondary | ICD-10-CM | POA: Diagnosis not present

## 2023-06-27 DIAGNOSIS — I272 Pulmonary hypertension, unspecified: Secondary | ICD-10-CM | POA: Diagnosis not present

## 2023-06-27 NOTE — Progress Notes (Signed)
Daily Session Note  Patient Details  Name: Stacie [name] MRN: [id number] Date of Birth: 05-Jan-1956 Referring Provider:   Flowsheet Row Pulmonary Rehab from 06/12/2023 in [facility] Cardiac and Pulmonary Rehab  Referring Provider Vida Rigger       Encounter Date: 06/27/2023  Check In:  Session Check In - 06/27/23 1139       Check-In   Supervising physician immediately available to respond to emergencies See telemetry face sheet for immediately available ER MD    Location [facility]-Cardiac & Pulmonary Rehab    Staff Present Ronette Deter, BS, Exercise Physiologist;Meredith Jewel Baize, RN BSN;Maxon Conetta BS, , Exercise Physiologist    Virtual Visit No    Medication changes reported     No    Fall or balance concerns reported    No    Warm-up and Cool-down Performed on first and last piece of equipment    Resistance Training Performed Yes    VAD Patient? No    PAD/SET Patient? No      Pain Assessment   Currently in Pain? No/denies                Social History   Tobacco Use  Smoking Status Never  Smokeless Tobacco Never    Goals Met:  Proper associated with RPD/PD & O2 Sat Independence with exercise equipment Exercise tolerated well No report of concerns or symptoms today  Goals Unmet:  Not Applicable  Comments: Pt able to follow exercise prescription today without complaint.  Will continue to monitor for progression.    Dr. Bethann Punches is Medical Director for [facility] Cardiac Rehabilitation.  Dr. Vida Rigger is Medical Director for [facility] Pulmonary Rehabilitation.

## 2023-06-29 ENCOUNTER — Encounter: Payer: BC Managed Care – PPO | Admitting: *Deleted

## 2023-06-29 DIAGNOSIS — D860 Sarcoidosis of lung: Secondary | ICD-10-CM | POA: Diagnosis not present

## 2023-06-29 DIAGNOSIS — I272 Pulmonary hypertension, unspecified: Secondary | ICD-10-CM | POA: Diagnosis not present

## 2023-06-29 NOTE — Progress Notes (Signed)
Daily Session Note  Patient Details  Name: Stacie [name] MRN: [id number] Date of Birth: [birth year] Referring Provider:   Flowsheet Row Pulmonary Rehab from 06/12/2023 in [facility] Cardiac and Pulmonary Rehab  Referring Provider Vida Rigger       Encounter Date: 06/29/2023  Check In:  Session Check In - 06/29/23 1128       Check-In   Supervising physician immediately available to respond to emergencies See telemetry face sheet for immediately available ER MD    Location [facility]-Cardiac & Pulmonary Rehab    Staff Present Ronette Deter, BS, Exercise Physiologist;Meredith Jewel Baize, RN BSN;Joseph Shelbie Proctor, RN, [place]    Virtual Visit No    Medication changes reported     No    Fall or balance concerns reported    No    Warm-up and Cool-down Performed on first and last piece of equipment    Resistance Training Performed Yes    VAD Patient? No    PAD/SET Patient? No      Pain Assessment   Currently in Pain? No/denies                Social History   Tobacco Use  Smoking Status Never  Smokeless Tobacco Never    Goals Met:  Independence with exercise equipment Exercise tolerated well No report of concerns or symptoms today Strength training completed today  Goals Unmet:  Not Applicable  Comments: Pt able to follow exercise prescription today without complaint.  Will continue to monitor for progression.    Dr. Bethann Punches is Medical Director for [facility] Cardiac Rehabilitation.  Dr. Vida Rigger is Medical Director for [facility] Pulmonary Rehabilitation.

## 2023-07-04 ENCOUNTER — Encounter: Payer: BC Managed Care – PPO | Admitting: *Deleted

## 2023-07-04 DIAGNOSIS — I272 Pulmonary hypertension, unspecified: Secondary | ICD-10-CM | POA: Diagnosis not present

## 2023-07-04 DIAGNOSIS — D860 Sarcoidosis of lung: Secondary | ICD-10-CM

## 2023-07-04 NOTE — Progress Notes (Signed)
Daily Session Note  Patient Details  Name: Stacie [name] MRN: [id number] Date of Birth: [birth year] Referring Provider:   Flowsheet Row Pulmonary Rehab from 06/12/2023 in [facility] Cardiac and Pulmonary Rehab  Referring Provider Vida Rigger       Encounter Date: 07/04/2023  Check In:  Session Check In - 07/04/23 1145       Check-In   Supervising physician immediately available to respond to emergencies See telemetry face sheet for immediately available ER MD    Location [facility]-Cardiac & Pulmonary Rehab    Staff Present Cora Collum, RN, BSN, CCRP;Noah Tickle, BS, Exercise Physiologist;Meredith Jewel Baize, RN BSN;Maxon Conetta BS, , Exercise Physiologist    Virtual Visit No    Medication changes reported     No    Fall or balance concerns reported    No    Warm-up and Cool-down Performed on first and last piece of equipment    Resistance Training Performed Yes    VAD Patient? No    PAD/SET Patient? No      Pain Assessment   Currently in Pain? No/denies                Social History   Tobacco Use  Smoking Status Never  Smokeless Tobacco Never    Goals Met:  Proper associated with RPD/PD & O2 Sat Independence with exercise equipment Exercise tolerated well No report of concerns or symptoms today  Goals Unmet:  Not Applicable  Comments: Pt able to follow exercise prescription today without complaint.  Will continue to monitor for progression.    Dr. Bethann Punches is Medical Director for [facility] Cardiac Rehabilitation.  Dr. Vida Rigger is Medical Director for [facility] Pulmonary Rehabilitation.

## 2023-07-05 DIAGNOSIS — G4733 Obstructive sleep apnea (adult) (pediatric): Secondary | ICD-10-CM | POA: Diagnosis not present

## 2023-07-05 DIAGNOSIS — I10 Essential (primary) hypertension: Secondary | ICD-10-CM | POA: Diagnosis not present

## 2023-07-06 ENCOUNTER — Encounter: Payer: BC Managed Care – PPO | Admitting: *Deleted

## 2023-07-11 ENCOUNTER — Encounter: Payer: BC Managed Care – PPO | Admitting: *Deleted

## 2023-07-13 ENCOUNTER — Encounter: Payer: BC Managed Care – PPO | Admitting: *Deleted

## 2023-07-13 DIAGNOSIS — D860 Sarcoidosis of lung: Secondary | ICD-10-CM

## 2023-07-13 DIAGNOSIS — I272 Pulmonary hypertension, unspecified: Secondary | ICD-10-CM | POA: Diagnosis not present

## 2023-07-13 NOTE — Progress Notes (Signed)
Daily Session Note  Patient Details  Name: Stacie Stacie [name] MRN: [id number] Date of Birth: 13-Jul-1956 Referring Provider:   Flowsheet Row Pulmonary Rehab from 06/12/2023 in [facility] Cardiac and Pulmonary Rehab  Referring Provider Stacie Stacie [name]       Encounter Date: 07/13/2023  Check In:  Session Check In - 07/13/23 1129       Check-In   Supervising physician immediately available Stacie [name] respond Stacie [name] emergencies See telemetry face sheet for immediately available ER MD    Location [facility]-Cardiac & Pulmonary Rehab    Staff Present Stacie Givens, RN BSN;Stacie Stacie [name], BS, RRT, CPFT;Stacie Stacie [name] BS, , Exercise Physiologist;Stacie Spells Katrinka Blazing, RN, ADN    Virtual Visit No    Medication changes reported     No    Fall or balance concerns reported    No    Warm-up and Cool-down Performed on first and last piece of equipment    Resistance Training Performed Yes    VAD Patient? No      Pain Assessment   Currently in Pain? No/denies                Social History   Tobacco Use  Smoking Status Never  Smokeless Tobacco Never    Goals Met:  Independence with exercise equipment Exercise tolerated well No report of concerns or symptoms today Strength training completed today  Goals Unmet:  Not Applicable  Comments: Pt able Stacie [name] follow exercise prescription today without complaint.  Will continue Stacie [name] monitor for progression.    Dr. Bethann Stacie [name] is Medical Director for [name].  Dr. Vida Stacie [name] is Medical Director for [name].

## 2023-07-16 ENCOUNTER — Other Ambulatory Visit: Payer: Self-pay | Admitting: Internal Medicine

## 2023-07-16 DIAGNOSIS — F39 Unspecified mood [affective] disorder: Secondary | ICD-10-CM

## 2023-07-18 ENCOUNTER — Encounter: Payer: BC Managed Care – PPO | Attending: Pulmonary Disease

## 2023-07-18 DIAGNOSIS — D860 Sarcoidosis of lung: Secondary | ICD-10-CM | POA: Insufficient documentation

## 2023-07-19 ENCOUNTER — Encounter: Payer: Self-pay | Admitting: *Deleted

## 2023-07-19 DIAGNOSIS — D860 Sarcoidosis of lung: Secondary | ICD-10-CM

## 2023-07-19 NOTE — Progress Notes (Signed)
Pulmonary Individual Treatment Plan  Patient Details  Name: Stacie [name] MRN: [id number] Date of Birth: 01-30-1956 Referring Provider:   Flowsheet Row Pulmonary Rehab from 06/12/2023 in [facility] Cardiac and Pulmonary Rehab  Referring Provider Vida Rigger       Initial Encounter Date:  Flowsheet Row Pulmonary Rehab from 06/12/2023 in [facility] Cardiac and Pulmonary Rehab  Date 06/12/23       Visit Diagnosis: Sarcoidosis of lung (HCC)  Patient's Home Medications on Admission:  Current Outpatient Medications:    acetaminophen (TYLENOL) 500 MG tablet, Take 500 mg by mouth every 6 (six) hours as needed for moderate pain or headache., Disp: , Rfl:    albuterol (ACCUNEB) 1.25 MG/3ML nebulizer solution, Take 1 ampule by nebulization as needed., Disp: , Rfl:    azithromycin (ZITHROMAX) 250 MG tablet, Take 250 mg by mouth 3 (three) times a week., Disp: , Rfl:    budesonide (PULMICORT) 0.25 MG/2ML nebulizer solution, Take 0.25 mg by nebulization daily as needed., Disp: , Rfl:    denosumab (PROLIA) 60 MG/ML SOSY injection, Inject 60 mg into the skin every 6 (six) months., Disp: , Rfl:    dorzolamide (TRUSOPT) 2 % ophthalmic solution, Place 1 drop into both eyes 2 (two) times daily., Disp: , Rfl:    escitalopram (LEXAPRO) 20 MG tablet, TAKE 1 TABLET BY MOUTH EVERY DAY, Disp: 90 tablet, Rfl: 3   Fluticasone-Umeclidin-Vilant (TRELEGY ELLIPTA) 200-62.5-25 MCG/ACT AEPB, Inhale into the lungs daily., Disp: , Rfl:    latanoprost (XALATAN) 0.005 % ophthalmic solution, Place 1 drop into the right eye 2 (two) times daily., Disp: , Rfl:    levothyroxine (SYNTHROID) 137 MCG tablet, Take 1 tablet (137 mcg total) by mouth daily before breakfast., Disp: 90 tablet, Rfl: 3   Multiple Vitamins-Minerals (MULTIVITAMIN WITH MINERALS) tablet, Take 1 tablet by mouth daily., Disp: , Rfl:    naproxen sodium (ALEVE) 220 MG tablet, Take 220 mg by mouth daily as needed (pain)., Disp: , Rfl:    NON FORMULARY, CPAP nightly,  Disp: , Rfl:    OXYGEN, Inhale 2 L into the lungs daily as needed (During walking and activity). , Disp: , Rfl:    predniSONE (DELTASONE) 10 MG tablet, Take 2 tablets (20 mg total) by mouth daily. Resume only after the finishing new Rx for prednisone (Patient taking differently: Take 13 mg by mouth daily.), Disp: , Rfl:    Probiotic Product (PROBIOTIC PO), Take 1 capsule by mouth daily., Disp: , Rfl:    Treprostinil (TYVASO) 0.6 MG/ML SOLN, Inhale into the lungs See admin instructions. 12 breaths 4 times a day, Disp: , Rfl:   Past Medical History: Past Medical History:  Diagnosis Date   Acute renal failure (HCC)    Acute respiratory failure (HCC) 10/23/2021   Acute respiratory failure with hypoxia (HCC)    ARF (acute respiratory failure) (HCC) 10/19/2021   Arrhythmia    patient unaware if this is current   Asthma    Chronic kidney disease    Critical lower limb ischemia (HCC)    Depression    GERD (gastroesophageal reflux disease)    Heart murmur    History of kidney stones    HOH (hard of hearing)    wear aids   Hyperthyroidism    Hypothyroidism    IBS (irritable bowel syndrome)    Pneumonia    Pulmonary hypertension (HCC)    Sarcoid    Sarcoidosis    Seasonal allergies    Sleep apnea CPAP with O2  Stroke (HCC) 07/2020   [place]   Wears hearing aid in both ears     Tobacco Use: Social History   Tobacco Use  Smoking Status Never  Smokeless Tobacco Never    Labs: Review Flowsheet  More data exists      Latest Ref Rng & Units 07/21/2020 01/14/2021 10/19/2021 06/24/2022 12/23/2022  Labs for ITP Cardiac and Pulmonary Rehab  Cholestrol 0 - 200 mg/dL 161  - - - -  LDL (calc) 0 - 99 mg/dL 096  - - - -  HDL-C >04 mg/dL 33  - - - -  Trlycerides <150 mg/dL 540  - - - -  Hemoglobin A1c 4.8 - 5.6 % - - - 6.1  -  PH, Arterial 7.35 - 7.45 - - - - 7.413   PCO2 arterial 32 - 48 mmHg - - - - 44.3   Bicarbonate 20.0 - 28.0 mmol/L - - 31.7  - 28.3  30.0   TCO2 22 - 32 mmol/L - 30  - -  30  32   O2 Saturation % - - 33.6  - 95  65     Details       Multiple values from one day are sorted in reverse-chronological order          Pulmonary Assessment Scores:  Pulmonary Assessment Scores     Row Name 06/12/23 1620         ADL UCSD   ADL Phase Entry     SOB Score total 91     Rest 1     Walk 3     Stairs 5     Bath 3     Dress 4     Shop 5       CAT Score   CAT Score 18       mMRC Score   mMRC Score 3              UCSD: Self-administered rating of dyspnea associated with activities of daily living (ADLs) 6-point scale (0 = "not at all" to 5 = "maximal or unable to do because of breathlessness")  Scoring Scores range from 0 to 120.  Minimally important difference is 5 units  CAT: CAT can identify the health impairment of COPD patients and is better correlated with disease progression.  CAT has a scoring range of zero to 40. The CAT score is classified into four groups of low (less than 10), medium (10 - 20), high (21-30) and very high (31-40) based on the impact level of disease on health status. A CAT score over 10 suggests significant symptoms.  A worsening CAT score could be explained by an exacerbation, poor medication adherence, poor inhaler technique, or progression of COPD or comorbid conditions.  CAT MCID is 2 points  mMRC: mMRC (Modified Medical Research Council) Dyspnea Scale is used to assess the degree of baseline functional disability in patients of respiratory disease due to dyspnea. No minimal important difference is established. A decrease in score of 1 point or greater is considered a positive change.   Pulmonary Function Assessment:   Exercise Target Goals: Exercise Program Goal: Individual exercise prescription set using results from initial 6 min walk test and THRR while considering  patient's activity barriers and safety.   Exercise Prescription Goal: Initial exercise prescription builds to 30-45 minutes a day of aerobic  activity, 2-3 days per week.  Home exercise guidelines will be given to patient during program as part of exercise prescription  that the participant will acknowledge.  Education: Aerobic Exercise: - Group verbal and visual presentation on the components of exercise prescription. Introduces F.I.T.T principle from ACSM for exercise prescriptions.  Reviews F.I.T.T. principles of aerobic exercise including progression. Written material given at graduation. Flowsheet Row Pulmonary Rehab from 08/10/2018 in [facility] Cardiac and Pulmonary Rehab  Date 05/02/18  Educator [facility]  Instruction Review Code 1- Verbalizes Understanding       Education: Resistance Exercise: - Group verbal and visual presentation on the components of exercise prescription. Introduces F.I.T.T principle from ACSM for exercise prescriptions  Reviews F.I.T.T. principles of resistance exercise including progression. Written material given at graduation.    Education: Exercise & Equipment Safety: - Individual verbal instruction and demonstration of equipment use and safety with use of the equipment. Flowsheet Row Pulmonary Rehab from 06/12/2023 in [facility] Cardiac and Pulmonary Rehab  Date 06/12/23  Educator [facility]  Instruction Review Code 1- Verbalizes Understanding       Education: Exercise Physiology & General Exercise Guidelines: - Group verbal and written instruction with models to review the exercise physiology of the cardiovascular system and associated critical values. Provides general exercise guidelines with specific guidelines to those with heart or lung disease.  Flowsheet Row Pulmonary Rehab from 08/10/2018 in [facility] Cardiac and Pulmonary Rehab  Date 07/11/18  Educator [facility]  Instruction Review Code 1- Verbalizes Understanding       Education: Flexibility, Balance, Mind/Body Relaxation: - Group verbal and visual presentation with interactive activity on the components of exercise prescription. Introduces F.I.T.T principle from  ACSM for exercise prescriptions. Reviews F.I.T.T. principles of flexibility and balance exercise training including progression. Also discusses the mind body connection.  Reviews various relaxation techniques to help reduce and manage stress (i.e. Deep breathing, progressive muscle relaxation, and visualization). Balance handout provided to take home. Written material given at graduation. Flowsheet Row Pulmonary Rehab from 08/10/2018 in [facility] Cardiac and Pulmonary Rehab  Date 08/08/18  Educator AS  Instruction Review Code 1- Verbalizes Understanding       Activity Barriers & Risk Stratification:  Activity Barriers & Cardiac Risk Stratification - 06/12/23 1637       Activity Barriers & Cardiac Risk Stratification   Activity Barriers Muscular Weakness;Balance Concerns;Other (comment);Deconditioning    Comments Healing from broken left collarbone             6 Minute Walk:  6 Minute Walk     Row Name 06/12/23 1634         6 Minute Walk   Phase Initial     Distance 600 feet     Walk Time 4.77 minutes     # of Rest Breaks 1     MPH 1.43     METS 2.19     RPE 18     Perceived Dyspnea  3     VO2 Peak 7.69     Symptoms Yes (comment)     Comments Lightheadedness at the end of test     Resting HR 117 bpm     Resting BP 150/90     Resting Oxygen Saturation  90 %     Exercise Oxygen Saturation  during 6 min walk 85 %     Max Ex. HR 134 bpm     Max Ex. BP 150/80     2 Minute Post BP 148/80       Interval HR   1 Minute HR 118     2 Minute HR 127     3  Minute HR 128     4 Minute HR 124     5 Minute HR 88     6 Minute HR 134     2 Minute Post HR 116     Interval Heart Rate? Yes       Interval Oxygen   Interval Oxygen? Yes     Baseline Oxygen Saturation % 90 %     1 Minute Oxygen Saturation % 87 %     1 Minute Liters of Oxygen 2 L     2 Minute Oxygen Saturation % 86 %     2 Minute Liters of Oxygen 2 L     3 Minute Oxygen Saturation % 85 %     3 Minute Liters of  Oxygen 2 L     4 Minute Oxygen Saturation % 87 %     4 Minute Liters of Oxygen 2 L     5 Minute Oxygen Saturation % 86 %     5 Minute Liters of Oxygen 2 L     6 Minute Oxygen Saturation % 85 %     6 Minute Liters of Oxygen 2 L     2 Minute Post Oxygen Saturation % 93 %     2 Minute Post Liters of Oxygen 2 L             Oxygen Initial Assessment:  Oxygen Initial Assessment - 04/26/23 1002       Home Oxygen   Home Oxygen Device Portable Concentrator;Home Concentrator    Sleep Oxygen Prescription CPAP    Liters per minute 2    Home Exercise Oxygen Prescription Continuous    Liters per minute 2    Home Resting Oxygen Prescription Continuous    Liters per minute 2    Compliance with Home Oxygen Use Yes      Intervention   Short Term Goals To learn and exhibit compliance with exercise, home and travel O2 prescription;To learn and understand importance of monitoring SPO2 with pulse oximeter and demonstrate accurate use of the pulse oximeter.;To learn and understand importance of maintaining oxygen saturations>88%;To learn and demonstrate proper pursed lip breathing techniques or other breathing techniques. ;To learn and demonstrate proper use of respiratory medications    Long  Term Goals Exhibits compliance with exercise, home  and travel O2 prescription;Verbalizes importance of monitoring SPO2 with pulse oximeter and return demonstration;Maintenance of O2 saturations>88%;Exhibits proper breathing techniques, such as pursed lip breathing or other method taught during program session;Compliance with respiratory medication;Demonstrates proper use of MDI's             Oxygen Re-Evaluation:  Oxygen Re-Evaluation     Row Name 06/13/23 1015 07/13/23 1116           Program Oxygen Prescription   Program Oxygen Prescription Continuous Continuous;E-Tanks      Liters per minute 2 4        Home Oxygen   Home Oxygen Device Portable Concentrator;Home Concentrator Portable  Concentrator;Home Concentrator      Sleep Oxygen Prescription CPAP CPAP      Liters per minute 2 2      Home Exercise Oxygen Prescription Continuous Continuous      Liters per minute 2 4      Home Resting Oxygen Prescription Continuous Continuous      Liters per minute 2 2      Compliance with Home Oxygen Use Yes Yes        Goals/Expected Outcomes  Short Term Goals To learn and demonstrate proper pursed lip breathing techniques or other breathing techniques.  To learn and demonstrate proper pursed lip breathing techniques or other breathing techniques.       Long  Term Goals Exhibits proper breathing techniques, such as pursed lip breathing or other method taught during program session Exhibits proper breathing techniques, such as pursed lip breathing or other method taught during program session      Comments Reviewed PLB technique with pt.  Talked about how it works and it's importance in maintaining their exercise saturations. Informed patient how to perform the Pursed Lipped breathing technique. Told patient to Inhale through the nose and out the mouth with pursed lips to keep their airways open, help oxygenate them better, practice when at rest or doing strenuous activity. Patient Verbalizes understanding of technique and will work on and be reiterated during [name].      Goals/Expected Outcomes Short: Become more profiecient at using PLB. Long: Become independent at using PLB. Short: use PLB with exertion. Long: use PLB on exertion proficiently and independently.               Oxygen Discharge (Final Oxygen Re-Evaluation):  Oxygen Re-Evaluation - 07/13/23 1116       Program Oxygen Prescription   Program Oxygen Prescription Continuous;E-Tanks    Liters per minute 4      Home Oxygen   Home Oxygen Device Portable Concentrator;Home Concentrator    Sleep Oxygen Prescription CPAP    Liters per minute 2    Home Exercise Oxygen Prescription Continuous    Liters per minute 4     Home Resting Oxygen Prescription Continuous    Liters per minute 2    Compliance with Home Oxygen Use Yes      Goals/Expected Outcomes   Short Term Goals To learn and demonstrate proper pursed lip breathing techniques or other breathing techniques.     Long  Term Goals Exhibits proper breathing techniques, such as pursed lip breathing or other method taught during program session    Comments Informed patient how to perform the Pursed Lipped breathing technique. Told patient to Inhale through the nose and out the mouth with pursed lips to keep their airways open, help oxygenate them better, practice when at rest or doing strenuous activity. Patient Verbalizes understanding of technique and will work on and be reiterated during [name].    Goals/Expected Outcomes Short: use PLB with exertion. Long: use PLB on exertion proficiently and independently.             Initial Exercise Prescription:  Initial Exercise Prescription - 06/29/23 1300       NuStep   METs 1.6             Perform Capillary Blood Glucose checks as needed.  Exercise Prescription Changes:   Exercise Prescription Changes     Row Name 06/12/23 1600 06/29/23 1300 07/11/23 1400         Response to Exercise   Blood Pressure (Admit) 150/90 114/62 128/70     Blood Pressure (Exercise) 150/80 130/64 --     Blood Pressure (Exit) 112/80 120/70 114/68     Heart Rate (Admit) 117 bpm 103 bpm 103 bpm     Heart Rate (Exercise) 134 bpm 124 bpm 119 bpm     Heart Rate (Exit) 117 bpm 97 bpm 102 bpm     Oxygen Saturation (Admit) 90 % 92 % 94 %     Oxygen Saturation (Exercise) 85 %  87 % 88 %     Oxygen Saturation (Exit) 91 % 94 % 96 %     Rating of Perceived Exertion (Exercise) 18 17 14      Perceived Dyspnea (Exercise) 3 4 3      Symptoms Lightheadedness at end of none none     Comments results First two days of exercise --     Duration -- Progress to 30 minutes of  aerobic without signs/symptoms of physical  distress Progress to 30 minutes of  aerobic without signs/symptoms of physical distress     Intensity -- THRR unchanged THRR unchanged       Progression   Progression -- Continue to progress workloads to maintain intensity without signs/symptoms of physical distress. Continue to progress workloads to maintain intensity without signs/symptoms of physical distress.     Average METs -- 1.75 2.04       Resistance Training   Training Prescription -- Yes Yes     Weight -- 2lb 2 lb     Reps -- 10-15 10-15       Interval Training   Interval Training -- No No       Oxygen   Oxygen -- Continuous Continuous     Liters -- 3-4 4       Treadmill   MPH -- 1.2 1     Grade -- 0 0     Minutes -- 10 15     METs -- 1.92 1.77       Recumbant Bike   Level -- -- 1     Watts -- -- 25     Minutes -- -- 15     METs -- -- 2.92       NuStep   Level -- 2  T6 3     Minutes -- 15 15     METs -- -- 2       T5 Nustep   Level -- -- 1     Minutes -- -- 15     METs -- -- 1.7       Oxygen   Maintain Oxygen Saturation -- 88% or higher 88% or higher              Exercise Comments:   Exercise Comments     Row Name 06/13/23 1015           Exercise Comments First full day of exercise!  Patient was oriented to gym and equipment including functions, settings, policies, and procedures.  Patient's individual exercise prescription and treatment plan were reviewed.  All starting workloads were established based on the results of the 6 minute walk test done at initial orientation visit.  The plan for exercise progression was also introduced and progression will be customized based on patient's performance and goals.                Exercise Goals and Review:   Exercise Goals     Row Name 06/12/23 1643             Exercise Goals   Increase Physical Activity Yes       Intervention Provide advice, education, support and counseling about physical activity/exercise needs.;Develop an  individualized exercise prescription for aerobic and resistive training based on initial evaluation findings, risk stratification, comorbidities and participant's personal goals.       Expected Outcomes Short Term: Attend rehab on a regular basis to increase amount of physical activity.;Long Term: Exercising regularly at least 3-5 days  a week.;Long Term: Add in home exercise to make exercise part of routine and to increase amount of physical activity.       Increase Strength and Stamina Yes       Intervention Provide advice, education, support and counseling about physical activity/exercise needs.;Develop an individualized exercise prescription for aerobic and resistive training based on initial evaluation findings, risk stratification, comorbidities and participant's personal goals.       Expected Outcomes Short Term: Increase workloads from initial exercise prescription for resistance, speed, and METs.;Short Term: Perform resistance training exercises routinely during rehab and add in resistance training at home;Long Term: Improve cardiorespiratory fitness, muscular endurance and strength as measured by increased METs and functional capacity ( )       Able to understand and use rate of perceived exertion (RPE) scale Yes       Intervention Provide education and explanation on how to use RPE scale       Expected Outcomes Short Term: Able to use RPE daily in rehab to express subjective intensity level;Long Term:  Able to use RPE to guide intensity level when exercising independently       Able to understand and use Dyspnea scale Yes       Intervention Provide education and explanation on how to use Dyspnea scale       Expected Outcomes Short Term: Able to use Dyspnea scale daily in rehab to express subjective sense of shortness of breath during exertion;Long Term: Able to use Dyspnea scale to guide intensity level when exercising independently       Knowledge and understanding of Target Heart Rate Range  (THRR) Yes       Intervention Provide education and explanation of THRR including how the numbers were predicted and where they are located for reference       Expected Outcomes Short Term: Able to state/look up THRR;Short Term: Able to use daily as guideline for intensity in rehab;Long Term: Able to use THRR to govern intensity when exercising independently       Able to check pulse independently Yes       Intervention Provide education and demonstration on how to check pulse in carotid and radial arteries.;Review the importance of being able to check your own pulse for safety during independent exercise       Expected Outcomes Short Term: Able to explain why pulse checking is important during independent exercise;Long Term: Able to check pulse independently and accurately       Understanding of Exercise Prescription Yes       Intervention Provide education, explanation, and written materials on patient's individual exercise prescription       Expected Outcomes Short Term: Able to explain program exercise prescription;Long Term: Able to explain home exercise prescription to exercise independently                Exercise Goals Re-Evaluation :  Exercise Goals Re-Evaluation     Row Name 06/13/23 1016 06/29/23 1345 07/11/23 1423         Exercise Goal Re-Evaluation   Exercise Goals Review Understanding of Exercise Prescription;Knowledge and understanding of Target Heart Rate Range (THRR);Able to understand and use Dyspnea scale;Able to understand and use rate of perceived exertion (RPE) scale Increase Physical Activity;Increase Strength and Stamina;Understanding of Exercise Prescription Increase Physical Activity;Increase Strength and Stamina;Understanding of Exercise Prescription     Comments Reviewed RPE  and dyspnea scale, THR and program prescription with pt today.  Pt voiced understanding and was given a copy  of goals to take home.       Short: Use RPE daily to regulate intensity.  Long:  Follow program prescription in THR. Stacie [name] is off to a good start in the program. She is doing well on the treadmill as she was able to walk at a speed of 1.2 mph for 10 minutes with no incline. She also improved to level 2 on the T6 nustep during her second session. We will continue to monitor her progress in the program. Stacie [name] is doing well in rehab. She improved to level 3 on the T4 nustep and worked at level 1 on the recumbent bike and T5 nustep. She did see a decrease in her speed on the treadmill from 1.2 mph to 1.0 mph. We will continue to monitor her progress in the program.     Expected Outcomes Short: Use RPE daily to regulate intensity. Long: Follow program prescription in THR. Short: Continue to follow current exercise prescription. Long: Continue exercise to improve strength and stamina. Short: Increase treadmill speed back up to 1.2 mph. Long: Continue exercise to improve strength and stamina.              Discharge Exercise Prescription (Final Exercise Prescription Changes):  Exercise Prescription Changes - 07/11/23 1400       Response to Exercise   Blood Pressure (Admit) 128/70    Blood Pressure (Exit) 114/68    Heart Rate (Admit) 103 bpm    Heart Rate (Exercise) 119 bpm    Heart Rate (Exit) 102 bpm    Oxygen Saturation (Admit) 94 %    Oxygen Saturation (Exercise) 88 %    Oxygen Saturation (Exit) 96 %    Rating of Perceived Exertion (Exercise) 14    Perceived Dyspnea (Exercise) 3    Symptoms none    Duration Progress to 30 minutes of  aerobic without signs/symptoms of physical distress    Intensity THRR unchanged      Progression   Progression Continue to progress workloads to maintain intensity without signs/symptoms of physical distress.    Average METs 2.04      Resistance Training   Training Prescription Yes    Weight 2 lb    Reps 10-15      Interval Training   Interval Training No      Oxygen   Oxygen Continuous    Liters 4      Treadmill   MPH 1     Grade 0    Minutes 15    METs 1.77      Recumbant Bike   Level 1    Watts 25    Minutes 15    METs 2.92      NuStep   Level 3    Minutes 15    METs 2      T5 Nustep   Level 1    Minutes 15    METs 1.7      Oxygen   Maintain Oxygen Saturation 88% or higher             Nutrition:  Target Goals: Understanding of nutrition guidelines, daily intake of sodium 1500mg , cholesterol 200mg , calories 30% from fat and 7% or less from saturated fats, daily to have 5 or more servings of fruits and vegetables.  Education: All About Nutrition: -Group instruction provided by verbal, written material, interactive activities, discussions, models, and posters to present general guidelines for heart healthy nutrition including fat, fiber, MyPlate, the role of sodium in heart healthy  nutrition, utilization of the nutrition label, and utilization of this knowledge for meal planning. Follow up email sent as well. Written material given at graduation. Flowsheet Row Pulmonary Rehab from 08/10/2018 in [facility] Cardiac and Pulmonary Rehab  Date 06/27/18  Educator LB  Instruction Review Code 1- Verbalizes Understanding       Biometrics:  Pre Biometrics - 06/12/23 1644       Pre Biometrics   Height 5\' 7"  (1.702 m)    Weight 187 lb 11.2 oz (85.1 kg)    Waist Circumference 44.5 inches    Hip Circumference 49 inches    Waist to Hip Ratio 0.91 %    BMI (Calculated) 29.39    Single Leg Stand 3.6 seconds              Nutrition Therapy Plan and Nutrition Goals:   Nutrition Assessments:  MEDIFICTS Score Key: >=70 Need to make dietary changes  40-70 Heart Healthy Diet <= 40 Therapeutic Level Cholesterol Diet  Flowsheet Row Pulmonary Rehab from 06/12/2023 in [facility] Cardiac and Pulmonary Rehab  Picture Your Plate Total Score on Admission 64      Picture Your Plate Scores: <82 Unhealthy dietary pattern with much room for improvement. 41-50 Dietary pattern unlikely to meet  recommendations for good health and room for improvement. 51-60 More healthful dietary pattern, with some room for improvement.  >60 Healthy dietary pattern, although there may be some specific behaviors that could be improved.   Nutrition Goals Re-Evaluation:  Nutrition Goals Re-Evaluation     Row Name 07/13/23 1121             Goals   Current Weight 185 lb (83.9 kg)       Comment Patient was informed on why it is important to maintain a balanced diet when dealing with Respiratory issues. Explained that it takes a lot of energy to breath and when they are short of breath often they will need to have a good diet to help keep up with the calories they are expending for breathing.       Expected Outcome Short: Choose and plan snacks accordingly to patients caloric intake to improve breathing. Long: Maintain a diet independently that meets their caloric intake to aid in daily shortness of breath.                Nutrition Goals Discharge (Final Nutrition Goals Re-Evaluation):  Nutrition Goals Re-Evaluation - 07/13/23 1121       Goals   Current Weight 185 lb (83.9 kg)    Comment Patient was informed on why it is important to maintain a balanced diet when dealing with Respiratory issues. Explained that it takes a lot of energy to breath and when they are short of breath often they will need to have a good diet to help keep up with the calories they are expending for breathing.    Expected Outcome Short: Choose and plan snacks accordingly to patients caloric intake to improve breathing. Long: Maintain a diet independently that meets their caloric intake to aid in daily shortness of breath.             Psychosocial: Target Goals: Acknowledge presence or absence of significant depression and/or stress, maximize coping skills, provide positive support system. Participant is able to verbalize types and ability to use techniques and skills needed for reducing stress and depression.    Education: Stress, Anxiety, and Depression - Group verbal and visual presentation to define topics covered.  Reviews how body  is impacted by stress, anxiety, and depression.  Also discusses healthy ways to reduce stress and to treat/manage anxiety and depression.  Written material given at graduation. Flowsheet Row Pulmonary Rehab from 08/10/2022 in [facility] Cardiac and Pulmonary Rehab  Date 08/10/22  Educator [facility]  Instruction Review Code 1- [employer] Understanding       Education: Sleep Hygiene -Provides group verbal and written instruction about how sleep can affect your health.  Define sleep hygiene, discuss sleep cycles and impact of sleep habits. Review good sleep hygiene tips.  Flowsheet Row Pulmonary Rehab from 08/10/2018 in [facility] Cardiac and Pulmonary Rehab  Date 04/11/18  Educator [facility]  Instruction Review Code 1- Verbalizes Understanding       Initial Review & Psychosocial Screening:  Initial Psych Review & Screening - 04/26/23 1017       Initial Review   Current issues with None Identified      Family Dynamics   Good Support System? Yes   husband, daughters, family     Barriers   Psychosocial barriers to participate in program There are no identifiable barriers or psychosocial needs.;The patient should benefit from training in stress management and relaxation.      Screening Interventions   Interventions Encouraged to exercise;Provide feedback about the scores to participant    Expected Outcomes Short Term goal: Utilizing psychosocial counselor, staff and physician to assist with identification of specific Stressors or current issues interfering with healing process. Setting desired goal for each stressor or current issue identified.;Long Term Goal: Stressors or current issues are controlled or eliminated.;Short Term goal: Identification and review with participant of any Quality of Life or Depression concerns found by scoring the questionnaire.;Long Term goal: The  participant improves quality of Life and PHQ9 Scores as seen by post scores and/or verbalization of changes             Quality of Life Scores:  Scores of 19 and below usually indicate a poorer quality of life in these areas.  A difference of  2-3 points is a clinically meaningful difference.  A difference of 2-3 points in the total score of the Quality of Life Index has been associated with significant improvement in overall quality of life, self-image, physical symptoms, and general health in studies assessing change in quality of life.  PHQ-9: Review Flowsheet  More data exists      06/12/2023 07/20/2022 06/24/2022 10/13/2021 09/28/2021  Depression screen PHQ 2/9  Decreased Interest 1 0 0 0 0  Down, Depressed, Hopeless 0 0 0 0 0  PHQ - 2 Score 1 0 0 0 0  Altered sleeping 0 1 0 0 0  Tired, decreased energy 2 1 0 0 0  Change in appetite 0 0 0 0 0  Feeling bad or failure about yourself  0 0 0 0 0  Trouble concentrating 0 1 0 0 0  Moving slowly or fidgety/restless 0 0 0 0 0  Suicidal thoughts 0 0 0 0 0  PHQ-9 Score 3 3 0 0 0  Difficult doing work/chores Somewhat difficult Not difficult at all Not difficult at all Not difficult at all Not difficult at all    Details           Interpretation of Total Score  Total Score Depression Severity:  1-4 = Minimal depression, 5-9 = Mild depression, 10-14 = Moderate depression, 15-19 = Moderately severe depression, 20-27 = Severe depression   Psychosocial Evaluation and Intervention:  Psychosocial Evaluation - 04/26/23 1018  Psychosocial Evaluation & Interventions   Interventions Encouraged to exercise with the program and follow exercise prescription    Comments Stacie [name] is returning to pulmonary rehab after having to discharge early in January due to health concerns. Since then, her cardiac work up has been completed with no concerns. She has noticed her breathing has gotten worse because she has not been as active. She also notes  that her legs are weak and her stamina has decreased. She recently fell and doesn't really know if it was related to blacking out with bending over movement or something else. She sees her doctor this Friday. She is motivated to return and wants to be able to keep up with her family. When asked about stress, she states she has a great outlook on life thanks to her family and everyone is doing well.    Expected Outcomes Short: attend pulmonary rehab for education and exercise. Long: develop and maintain positive self care habits.    Continue Psychosocial Services  Follow up required by staff             Psychosocial Re-Evaluation:  Psychosocial Re-Evaluation     Row Name 07/13/23 1119             Psychosocial Re-Evaluation   Current issues with Current Psychotropic Meds;Current Stress Concerns       Comments Her depression and stress concerns stem from her health. She thinks she is doing better with her mental health with all her life changes. Her daughter helps her alot and is trying to give her daughter time to have a life for herself.       Expected Outcomes Short; Conitnue to exercise for menatl boost Long: Continue to stay positive       Interventions Encouraged to attend Pulmonary Rehabilitation for the exercise       Continue Psychosocial Services  Follow up required by staff                Psychosocial Discharge (Final Psychosocial Re-Evaluation):  Psychosocial Re-Evaluation - 07/13/23 1119       Psychosocial Re-Evaluation   Current issues with Current Psychotropic Meds;Current Stress Concerns    Comments Her depression and stress concerns stem from her health. She thinks she is doing better with her mental health with all her life changes. Her daughter helps her alot and is trying to give her daughter time to have a life for herself.    Expected Outcomes Short; Conitnue to exercise for menatl boost Long: Continue to stay positive    Interventions Encouraged to attend  Pulmonary Rehabilitation for the exercise    Continue Psychosocial Services  Follow up required by staff             Education: Education Goals: Education classes will be provided on a weekly basis, covering required topics. Participant will state understanding/return demonstration of topics presented.  Learning Barriers/Preferences:  Learning Barriers/Preferences - 04/26/23 1008       Learning Barriers/Preferences   Learning Barriers None    Learning Preferences None             General Pulmonary Education Topics:  Infection Prevention: - Provides verbal and written material to individual with discussion of infection control including proper hand washing and proper equipment cleaning during exercise session. Flowsheet Row Pulmonary Rehab from 06/12/2023 in [facility] Cardiac and Pulmonary Rehab  Date 06/12/23  Educator MB  Instruction Review Code 1- Verbalizes Understanding       Falls Prevention: - Provides  verbal and written material to individual with discussion of falls prevention and safety. Flowsheet Row Pulmonary Rehab from 06/12/2023 in [facility] Cardiac and Pulmonary Rehab  Date 06/12/23  Educator MB  Instruction Review Code 1- Verbalizes Understanding       Chronic Lung Disease Review: - Group verbal instruction with posters, models, PowerPoint presentations and videos,  to review new updates, new respiratory medications, new advancements in procedures and treatments. Providing information on websites and "800" numbers for continued self-education. Includes information about supplement oxygen, available portable oxygen systems, continuous and intermittent flow rates, oxygen safety, concentrators, and Medicare reimbursement for oxygen. Explanation of Pulmonary Drugs, including class, frequency, complications, importance of spacers, rinsing mouth after steroid MDI's, and proper cleaning methods for nebulizers. Review of basic lung anatomy and physiology related to  function, structure, and complications of lung disease. Review of risk factors. Discussion about methods for diagnosing sleep apnea and types of masks and machines for OSA. Includes a review of the use of types of environmental controls: home humidity, furnaces, filters, dust mite/pet prevention, HEPA vacuums. Discussion about weather changes, air quality and the benefits of nasal washing. Instruction on Warning signs, infection symptoms, calling MD promptly, preventive modes, and value of vaccinations. Review of effective airway clearance, coughing and/or vibration techniques. Emphasizing that all should Create an Action Plan. Written material given at graduation. Flowsheet Row Pulmonary Rehab from 08/10/2018 in [facility] Cardiac and Pulmonary Rehab  Date 06/29/18  Educator [facility]  Instruction Review Code 1- Verbalizes Understanding       AED/CPR: - Group verbal and written instruction with the use of models to demonstrate the basic use of the AED with the basic ABC's of resuscitation.    Anatomy and Cardiac Procedures: - Group verbal and visual presentation and models provide information about basic cardiac anatomy and function. Reviews the testing methods done to diagnose heart disease and the outcomes of the test results. Describes the treatment choices: Medical Management, Angioplasty, or Coronary Bypass Surgery for treating various heart conditions including Myocardial Infarction, Angina, Valve Disease, and Cardiac Arrhythmias.  Written material given at graduation. Flowsheet Row Pulmonary Rehab from 08/10/2018 in [facility] Cardiac and Pulmonary Rehab  Date 05/16/18  Educator [facility]  Instruction Review Code 5- Refused Teaching       Medication Safety: - Group verbal and visual instruction to review commonly prescribed medications for heart and lung disease. Reviews the medication, class of the drug, and side effects. Includes the steps to properly store meds and maintain the prescription regimen.   Written material given at graduation. Flowsheet Row Pulmonary Rehab from 08/10/2018 in [facility] Cardiac and Pulmonary Rehab  Date 08/10/18  Educator [facility]  Instruction Review Code 1- Verbalizes Understanding       Other: -Provides group and verbal instruction on various topics (see comments)   Knowledge Questionnaire Score:  Knowledge Questionnaire Score - 06/12/23 1624       Knowledge Questionnaire Score   Pre Score 18/18              Core Components/Risk Factors/Patient Goals at Admission:  Personal Goals and Risk Factors at Admission - 06/12/23 1646       Core Components/Risk Factors/Patient Goals on Admission    Weight Management Yes;Weight Maintenance;Weight Loss    Intervention Weight Management: Develop a combined nutrition and exercise program designed to reach desired caloric intake, while maintaining appropriate intake of nutrient and fiber, sodium and fats, and appropriate energy expenditure required for the weight goal.;Weight Management: Provide education and appropriate resources  to help participant work on and attain dietary goals.;Weight Management/Obesity: Establish reasonable short term and long term weight goals.    Admit Weight 187 lb 11.2 oz (85.1 kg)    Goal Weight: Short Term 172 lb (78 kg)    Goal Weight: Long Term 150 lb (68 kg)    Expected Outcomes Short Term: Continue to assess and modify interventions until short term weight is achieved;Long Term: Adherence to nutrition and physical activity/exercise program aimed toward attainment of established weight goal;Weight Loss: Understanding of general recommendations for a balanced deficit meal plan, which promotes 1-2 lb weight loss per week and includes a negative energy balance of [phone] kcal/d;Understanding recommendations for meals to include 15-35% energy as protein, 25-35% energy from fat, 35-60% energy from carbohydrates, less than 200mg  of dietary cholesterol, 20-35 gm of total fiber daily;Understanding  of distribution of calorie intake throughout the day with the consumption of 4-5 meals/snacks    Improve shortness of breath with ADL's Yes    Intervention Provide education, individualized exercise plan and daily activity instruction to help decrease symptoms of SOB with activities of daily living.    Expected Outcomes Short Term: Improve cardiorespiratory fitness to achieve a reduction of symptoms when performing ADLs;Long Term: Be able to perform more ADLs without symptoms or delay the onset of symptoms    Hypertension Yes    Intervention Provide education on lifestyle modifcations including regular physical activity/exercise, weight management, moderate sodium restriction and increased consumption of fresh fruit, vegetables, and low fat dairy, alcohol moderation, and smoking cessation.;Monitor prescription use compliance.    Expected Outcomes Short Term: Continued assessment and intervention until BP is < 140/32mm HG in hypertensive participants. < 130/35mm HG in hypertensive participants with diabetes, heart failure or chronic kidney disease.;Long Term: Maintenance of blood pressure at goal levels.    Lipids Yes    Intervention Provide education and support for participant on nutrition & aerobic/resistive exercise along with prescribed medications to achieve LDL 70mg , HDL >40mg .    Expected Outcomes Short Term: Participant states understanding of desired cholesterol values and is compliant with medications prescribed. Participant is following exercise prescription and nutrition guidelines.;Long Term: Cholesterol controlled with medications as prescribed, with individualized exercise RX and with personalized nutrition plan. Value goals: LDL < 70mg , HDL > 40 mg.             Education:Diabetes - Individual verbal and written instruction to review signs/symptoms of diabetes, desired ranges of glucose level fasting, after meals and with exercise. Acknowledge that pre and post exercise glucose  checks will be done for 3 sessions at entry of program.   Know Your Numbers and Heart Failure: - Group verbal and visual instruction to discuss disease risk factors for cardiac and pulmonary disease and treatment options.  Reviews associated critical values for Overweight/Obesity, Hypertension, Cholesterol, and Diabetes.  Discusses basics of heart failure: signs/symptoms and treatments.  Introduces Heart Failure Zone chart for action plan for heart failure.  Written material given at graduation. Flowsheet Row Pulmonary Rehab from 08/10/2022 in [facility] Cardiac and Pulmonary Rehab  Date 07/27/22  Educator SB  Instruction Review Code 1- Verbalizes Understanding       Core Components/Risk Factors/Patient Goals Review:   Goals and Risk Factor Review     Row Name 07/13/23 1118             Core Components/Risk Factors/Patient Goals Review   Personal Goals Review Improve shortness of breath with ADL's       Review Spoke to  patient about their shortness of breath and what they can do to improve. Patient has been informed of breathing techniques when starting the program. Patient is informed to tell staff if they have had any med changes and that certain meds they are taking or not taking can be causing shortness of breath.       Expected Outcomes Short: Attend [name] regularly to improve shortness of breath with ADL's. Long: maintain independence with ADL's                Core Components/Risk Factors/Patient Goals at Discharge (Final Review):   Goals and Risk Factor Review - 07/13/23 1118       Core Components/Risk Factors/Patient Goals Review   Personal Goals Review Improve shortness of breath with ADL's    Review Spoke to patient about their shortness of breath and what they can do to improve. Patient has been informed of breathing techniques when starting the program. Patient is informed to tell staff if they have had any med changes and that certain meds they are taking or not  taking can be causing shortness of breath.    Expected Outcomes Short: Attend [name] regularly to improve shortness of breath with ADL's. Long: maintain independence with ADL's             ITP Comments:  ITP Comments     Row Name 04/26/23 1016 06/12/23 1633 06/13/23 1015 06/21/23 1042 07/19/23 1218   ITP Comments Initial phone call completed. Diagnosis can be found in [facility] 5/7. EP Orientation scheduled for Wednesday 7/17 at 9:30. Completed and gym orientation. Initial ITP created and sent for review to Vida Rigger, Medical Director. First full day of exercise!  Patient was oriented to gym and equipment including functions, settings, policies, and procedures.  Patient's individual exercise prescription and treatment plan were reviewed.  All starting workloads were established based on the results of the 6 minute walk test done at initial orientation visit.  The plan for exercise progression was also introduced and progression will be customized based on patient's performance and goals. 30 Day review completed. Medical Director ITP review done, changes made as directed, and signed approval by Medical Director.     new to program 30 Day review completed. Medical Director ITP review done, changes made as directed, and signed approval by Medical Director.            Comments:

## 2023-07-20 ENCOUNTER — Encounter: Payer: BC Managed Care – PPO | Admitting: *Deleted

## 2023-07-25 ENCOUNTER — Encounter: Payer: BC Managed Care – PPO | Admitting: *Deleted

## 2023-07-25 DIAGNOSIS — D860 Sarcoidosis of lung: Secondary | ICD-10-CM

## 2023-07-25 NOTE — Progress Notes (Signed)
Daily Session Note  Patient Details  Name: Stacie [name] MRN: [id number] Date of Birth: April 25, 1956 Referring Provider:   Flowsheet Row Pulmonary Rehab from 06/12/2023 in [facility] Cardiac and Pulmonary Rehab  Referring Provider Vida Rigger       Encounter Date: 07/25/2023  Check In:  Session Check In - 07/25/23 1118       Check-In   Supervising physician immediately available to respond to emergencies See telemetry face sheet for immediately available ER MD    Location [facility]-Cardiac & Pulmonary Rehab    Staff Present Cora Collum, RN, BSN, CCRP;Noah Tickle, BS, Exercise Physiologist;Maxon Conetta BS, , Exercise Physiologist;Meredith Jewel Baize, RN BSN    Virtual Visit No    Medication changes reported     No    Fall or balance concerns reported    No    Warm-up and Cool-down Performed on first and last piece of equipment    Resistance Training Performed Yes    VAD Patient? No    PAD/SET Patient? No      Pain Assessment   Currently in Pain? No/denies                Social History   Tobacco Use  Smoking Status Never  Smokeless Tobacco Never    Goals Met:  Proper associated with RPD/PD & O2 Sat Independence with exercise equipment Exercise tolerated well No report of concerns or symptoms today  Goals Unmet:  Not Applicable  Comments: Pt able to follow exercise prescription today without complaint.  Will continue to monitor for progression.    Dr. Bethann Punches is Medical Director for [facility] Cardiac Rehabilitation.  Dr. Vida Rigger is Medical Director for [facility] Pulmonary Rehabilitation.

## 2023-07-26 DIAGNOSIS — M810 Age-related osteoporosis without current pathological fracture: Secondary | ICD-10-CM | POA: Diagnosis not present

## 2023-07-26 DIAGNOSIS — Z7952 Long term (current) use of systemic steroids: Secondary | ICD-10-CM | POA: Diagnosis not present

## 2023-07-26 DIAGNOSIS — D869 Sarcoidosis, unspecified: Secondary | ICD-10-CM | POA: Diagnosis not present

## 2023-07-27 ENCOUNTER — Encounter: Payer: BC Managed Care – PPO | Admitting: *Deleted

## 2023-08-01 ENCOUNTER — Encounter: Payer: BC Managed Care – PPO | Admitting: *Deleted

## 2023-08-01 DIAGNOSIS — D860 Sarcoidosis of lung: Secondary | ICD-10-CM | POA: Diagnosis not present

## 2023-08-01 NOTE — Progress Notes (Signed)
Daily Session Note  Patient Details  Name: Stacie [name] MRN: [id number] Date of Birth: 06/15/1956 Referring Provider:   Flowsheet Row Pulmonary Rehab from 06/12/2023 in [facility] Cardiac and Pulmonary Rehab  Referring Provider Vida Rigger       Encounter Date: 08/01/2023  Check In:  Session Check In - 08/01/23 1124       Check-In   Supervising physician immediately available to respond to emergencies See telemetry face sheet for immediately available ER MD    Location [facility]-Cardiac & Pulmonary Rehab    Staff Present Cora Collum, RN, BSN, CCRP;Meredith Jewel Baize, RN BSN;Jason Wallace Cullens, RDN, LDN;Maxon Conetta BS, , Exercise Physiologist    Virtual Visit No    Medication changes reported     No    Fall or balance concerns reported    No    Warm-up and Cool-down Performed on first and last piece of equipment    Resistance Training Performed Yes    VAD Patient? No    PAD/SET Patient? No      Pain Assessment   Currently in Pain? No/denies                Social History   Tobacco Use  Smoking Status Never  Smokeless Tobacco Never    Goals Met:  Proper associated with RPD/PD & O2 Sat Independence with exercise equipment Exercise tolerated well No report of concerns or symptoms today  Goals Unmet:  Not Applicable  Comments: Pt able to follow exercise prescription today without complaint.  Will continue to monitor for progression.    Dr. Bethann Punches is Medical Director for [facility] Cardiac Rehabilitation.  Dr. Vida Rigger is Medical Director for [facility] Pulmonary Rehabilitation.

## 2023-08-03 ENCOUNTER — Encounter: Payer: BC Managed Care – PPO | Admitting: *Deleted

## 2023-08-08 DIAGNOSIS — M810 Age-related osteoporosis without current pathological fracture: Secondary | ICD-10-CM | POA: Diagnosis not present

## 2023-08-10 ENCOUNTER — Encounter: Payer: BC Managed Care – PPO | Admitting: *Deleted

## 2023-08-10 DIAGNOSIS — D860 Sarcoidosis of lung: Secondary | ICD-10-CM

## 2023-08-10 NOTE — Progress Notes (Signed)
Daily Session Note  Patient Details  Name: Stacie [name] MRN: [id number] Date of Birth: October 11, 1956 Referring Provider:   Flowsheet Row Pulmonary Rehab from 06/12/2023 in [facility]  Referring Provider Vida Rigger       Encounter Date: 08/10/2023  Check In:  Session Check In - 08/10/23 1057       Check-In   Supervising physician immediately available to respond to emergencies See telemetry face sheet for immediately available ER MD    Location [facility]    Staff Present Susann Givens, RN BSN;Joseph Reino Kent, Guinevere Ferrari, RN, [place]    Virtual Visit No    Medication changes reported     No    Fall or balance concerns reported    No    Warm-up and Cool-down Performed on first and last piece of equipment    Resistance Training Performed Yes    VAD Patient? No    PAD/SET Patient? No      Pain Assessment   Currently in Pain? No/denies                Social History   Tobacco Use  Smoking Status Never  Smokeless Tobacco Never    Goals Met:  Independence with exercise equipment Exercise tolerated well No report of concerns or symptoms today Strength training completed today  Goals Unmet:  Not Applicable  Comments: Pt able to follow exercise prescription today without complaint.  Will continue to monitor for progression.    Dr. Bethann Punches is Medical Director for [facility].  Dr. Vida Rigger is Medical Director for [facility].

## 2023-08-15 ENCOUNTER — Encounter: Payer: BC Managed Care – PPO | Admitting: *Deleted

## 2023-08-16 ENCOUNTER — Encounter: Payer: Self-pay | Admitting: *Deleted

## 2023-08-16 DIAGNOSIS — D860 Sarcoidosis of lung: Secondary | ICD-10-CM

## 2023-08-16 NOTE — Progress Notes (Signed)
Pulmonary Individual Treatment Plan  Patient Details  Name: Stacie [name] MRN: [id number] Date of Birth: 30-Mar-1956 Referring Provider:   Flowsheet Row Pulmonary Rehab from 06/12/2023 in [facility]  Referring Provider Vida Rigger       Initial Encounter Date:  Flowsheet Row Pulmonary Rehab from 06/12/2023 in [facility] Cardiac and Pulmonary Rehab  Date 06/12/23       Visit Diagnosis: Sarcoidosis of lung (HCC)  Patient's Home Medications on Admission:  Current Outpatient Medications:    acetaminophen (TYLENOL) 500 MG tablet, Take 500 mg by mouth every 6 (six) hours as needed for moderate pain or headache., Disp: , Rfl:    albuterol (ACCUNEB) 1.25 MG/3ML nebulizer solution, Take 1 ampule by nebulization as needed., Disp: , Rfl:    azithromycin (ZITHROMAX) 250 MG tablet, Take 250 mg by mouth 3 (three) times a week., Disp: , Rfl:    budesonide (PULMICORT) 0.25 MG/2ML nebulizer solution, Take 0.25 mg by nebulization daily as needed., Disp: , Rfl:    denosumab (PROLIA) 60 MG/ML SOSY injection, Inject 60 mg into the skin every 6 (six) months., Disp: , Rfl:    dorzolamide (TRUSOPT) 2 % ophthalmic solution, Place 1 drop into both eyes 2 (two) times daily., Disp: , Rfl:    escitalopram (LEXAPRO) 20 MG tablet, TAKE 1 TABLET BY MOUTH EVERY DAY, Disp: 90 tablet, Rfl: 3   Fluticasone-Umeclidin-Vilant (TRELEGY ELLIPTA) 200-62.5-25 MCG/ACT AEPB, Inhale into the lungs daily., Disp: , Rfl:    latanoprost (XALATAN) 0.005 % ophthalmic solution, Place 1 drop into the right eye 2 (two) times daily., Disp: , Rfl:    levothyroxine (SYNTHROID) 137 MCG tablet, Take 1 tablet (137 mcg total) by mouth daily before breakfast., Disp: 90 tablet, Rfl: 3   Multiple Vitamins-Minerals (MULTIVITAMIN WITH MINERALS) tablet, Take 1 tablet by mouth daily., Disp: , Rfl:    naproxen sodium (ALEVE) 220 MG tablet, Take 220 mg by mouth daily as needed (pain)., Disp: , Rfl:    NON FORMULARY, CPAP nightly,  Disp: , Rfl:    OXYGEN, Inhale 2 L into the lungs daily as needed (During walking and activity). , Disp: , Rfl:    predniSONE (DELTASONE) 10 MG tablet, Take 2 tablets (20 mg total) by mouth daily. Resume only after the finishing new Rx for prednisone (Patient taking differently: Take 13 mg by mouth daily.), Disp: , Rfl:    Probiotic Product (PROBIOTIC PO), Take 1 capsule by mouth daily., Disp: , Rfl:    Treprostinil (TYVASO) 0.6 MG/ML SOLN, Inhale into the lungs See admin instructions. 12 breaths 4 times a day, Disp: , Rfl:   Past Medical History: Past Medical History:  Diagnosis Date   Acute renal failure (HCC)    Acute respiratory failure (HCC) 10/23/2021   Acute respiratory failure with hypoxia (HCC)    ARF (acute respiratory failure) (HCC) 10/19/2021   Arrhythmia    patient unaware if this is current   Asthma    Chronic kidney disease    Critical lower limb ischemia (HCC)    Depression    GERD (gastroesophageal reflux disease)    Heart murmur    History of kidney stones    HOH (hard of hearing)    wear aids   Hyperthyroidism    Hypothyroidism    IBS (irritable bowel syndrome)    Pneumonia    Pulmonary hypertension (HCC)    Sarcoid    Sarcoidosis    Seasonal allergies    Sleep apnea CPAP with O2  Stroke (HCC) 07/2020   [place]   Wears hearing aid in both ears     Tobacco Use: Social History   Tobacco Use  Smoking Status Never  Smokeless Tobacco Never    Labs: Review Flowsheet  More data exists      Latest Ref Rng & Units 07/21/2020 01/14/2021 10/19/2021 06/24/2022 12/23/2022  Labs for ITP Cardiac and Pulmonary Rehab  Cholestrol 0 - 200 mg/dL 416  - - - -  LDL (calc) 0 - 99 mg/dL 606  - - - -  HDL-C >30 mg/dL 33  - - - -  Trlycerides <150 mg/dL 160  - - - -  Hemoglobin A1c 4.8 - 5.6 % - - - 6.1  -  PH, Arterial 7.35 - 7.45 - - - - 7.413   PCO2 arterial 32 - 48 mmHg - - - - 44.3   Bicarbonate 20.0 - 28.0 mmol/L - - 31.7  - 28.3  30.0   TCO2 22 - 32 mmol/L - 30  - -  30  32   O2 Saturation % - - 33.6  - 95  65     Details       Multiple values from one day are sorted in reverse-chronological order          Pulmonary Assessment Scores:  Pulmonary Assessment Scores     Row Name 06/12/23 1620         ADL UCSD   ADL Phase Entry     SOB Score total 91     Rest 1     Walk 3     Stairs 5     Bath 3     Dress 4     Shop 5       CAT Score   CAT Score 18       mMRC Score   mMRC Score 3              UCSD: Self-administered rating of dyspnea associated with activities of daily living (ADLs) 6-point scale (0 = "not at all" to 5 = "maximal or unable to do because of breathlessness")  Scoring Scores range from 0 to 120.  Minimally important difference is 5 units  CAT: CAT can identify the health impairment of COPD patients and is better correlated with disease progression.  CAT has a scoring range of zero to 40. The CAT score is classified into four groups of low (less than 10), medium (10 - 20), high (21-30) and very high (31-40) based on the impact level of disease on health status. A CAT score over 10 suggests significant symptoms.  A worsening CAT score could be explained by an exacerbation, poor medication adherence, poor inhaler technique, or progression of COPD or comorbid conditions.  CAT MCID is 2 points  mMRC: mMRC (Modified Medical Research Council) Dyspnea Scale is used to assess the degree of baseline functional disability in patients of respiratory disease due to dyspnea. No minimal important difference is established. A decrease in score of 1 point or greater is considered a positive change.   Pulmonary Function Assessment:   Exercise Target Goals: Exercise Program Goal: Individual exercise prescription set using results from initial 6 min walk test and THRR while considering  patient's activity barriers and safety.   Exercise Prescription Goal: Initial exercise prescription builds to 30-45 minutes a day of aerobic  activity, 2-3 days per week.  Home exercise guidelines will be given to patient during program as part of exercise prescription  that the participant will acknowledge.  Education: Aerobic Exercise: - Group verbal and visual presentation on the components of exercise prescription. Introduces F.I.T.T principle from ACSM for exercise prescriptions.  Reviews F.I.T.T. principles of aerobic exercise including progression. Written material given at graduation. Flowsheet Row Pulmonary Rehab from 08/10/2018 in [facility] Cardiac and Pulmonary Rehab  Date 05/02/18  Educator [facility]  Instruction Review Code 1- Verbalizes Understanding       Education: Resistance Exercise: - Group verbal and visual presentation on the components of exercise prescription. Introduces F.I.T.T principle from ACSM for exercise prescriptions  Reviews F.I.T.T. principles of resistance exercise including progression. Written material given at graduation.    Education: Exercise & Equipment Safety: - Individual verbal instruction and demonstration of equipment use and safety with use of the equipment. Flowsheet Row Pulmonary Rehab from 06/12/2023 in [facility] Cardiac and Pulmonary Rehab  Date 06/12/23  Educator MB  Instruction Review Code 1- Verbalizes Understanding       Education: Exercise Physiology & General Exercise Guidelines: - Group verbal and written instruction with models to review the exercise physiology of the cardiovascular system and associated critical values. Provides general exercise guidelines with specific guidelines to those with heart or lung disease.  Flowsheet Row Pulmonary Rehab from 08/10/2018 in [facility] Cardiac and Pulmonary Rehab  Date 07/11/18  Educator [facility]  Instruction Review Code 1- Verbalizes Understanding       Education: Flexibility, Balance, Mind/Body Relaxation: - Group verbal and visual presentation with interactive activity on the components of exercise prescription. Introduces F.I.T.T principle from  ACSM for exercise prescriptions. Reviews F.I.T.T. principles of flexibility and balance exercise training including progression. Also discusses the mind body connection.  Reviews various relaxation techniques to help reduce and manage stress (i.e. Deep breathing, progressive muscle relaxation, and visualization). Balance handout provided to take home. Written material given at graduation. Flowsheet Row Pulmonary Rehab from 08/10/2018 in [facility] Cardiac and Pulmonary Rehab  Date 08/08/18  Educator AS  Instruction Review Code 1- Verbalizes Understanding       Activity Barriers & Risk Stratification:  Activity Barriers & Cardiac Risk Stratification - 06/12/23 1637       Activity Barriers & Cardiac Risk Stratification   Activity Barriers Muscular Weakness;Balance Concerns;Other (comment);Deconditioning    Comments Healing from broken left collarbone             6 Minute Walk:  6 Minute Walk     Row Name 06/12/23 1634         6 Minute Walk   Phase Initial     Distance 600 feet     Walk Time 4.77 minutes     # of Rest Breaks 1     MPH 1.43     METS 2.19     RPE 18     Perceived Dyspnea  3     VO2 Peak 7.69     Symptoms Yes (comment)     Comments Lightheadedness at the end of test     Resting HR 117 bpm     Resting BP 150/90     Resting Oxygen Saturation  90 %     Exercise Oxygen Saturation  during 6 min walk 85 %     Max Ex. HR 134 bpm     Max Ex. BP 150/80     2 Minute Post BP 148/80       Interval HR   1 Minute HR 118     2 Minute HR 127     3  Minute HR 128     4 Minute HR 124     5 Minute HR 88     6 Minute HR 134     2 Minute Post HR 116     Interval Heart Rate? Yes       Interval Oxygen   Interval Oxygen? Yes     Baseline Oxygen Saturation % 90 %     1 Minute Oxygen Saturation % 87 %     1 Minute Liters of Oxygen 2 L     2 Minute Oxygen Saturation % 86 %     2 Minute Liters of Oxygen 2 L     3 Minute Oxygen Saturation % 85 %     3 Minute Liters of  Oxygen 2 L     4 Minute Oxygen Saturation % 87 %     4 Minute Liters of Oxygen 2 L     5 Minute Oxygen Saturation % 86 %     5 Minute Liters of Oxygen 2 L     6 Minute Oxygen Saturation % 85 %     6 Minute Liters of Oxygen 2 L     2 Minute Post Oxygen Saturation % 93 %     2 Minute Post Liters of Oxygen 2 L             Oxygen Initial Assessment:  Oxygen Initial Assessment - 04/26/23 1002       Home Oxygen   Home Oxygen Device Portable Concentrator;Home Concentrator    Sleep Oxygen Prescription CPAP    Liters per minute 2    Home Exercise Oxygen Prescription Continuous    Liters per minute 2    Home Resting Oxygen Prescription Continuous    Liters per minute 2    Compliance with Home Oxygen Use Yes      Intervention   Short Term Goals To learn and exhibit compliance with exercise, home and travel O2 prescription;To learn and understand importance of monitoring SPO2 with pulse oximeter and demonstrate accurate use of the pulse oximeter.;To learn and understand importance of maintaining oxygen saturations>88%;To learn and demonstrate proper pursed lip breathing techniques or other breathing techniques. ;To learn and demonstrate proper use of respiratory medications    Long  Term Goals Exhibits compliance with exercise, home  and travel O2 prescription;Verbalizes importance of monitoring SPO2 with pulse oximeter and return demonstration;Maintenance of O2 saturations>88%;Exhibits proper breathing techniques, such as pursed lip breathing or other method taught during program session;Compliance with respiratory medication;Demonstrates proper use of MDI's             Oxygen Re-Evaluation:  Oxygen Re-Evaluation     Row Name 06/13/23 1015 07/13/23 1116           Program Oxygen Prescription   Program Oxygen Prescription Continuous Continuous;E-Tanks      Liters per minute 2 4        Home Oxygen   Home Oxygen Device Portable Concentrator;Home Concentrator Portable  Concentrator;Home Concentrator      Sleep Oxygen Prescription CPAP CPAP      Liters per minute 2 2      Home Exercise Oxygen Prescription Continuous Continuous      Liters per minute 2 4      Home Resting Oxygen Prescription Continuous Continuous      Liters per minute 2 2      Compliance with Home Oxygen Use Yes Yes        Goals/Expected Outcomes  Short Term Goals To learn and demonstrate proper pursed lip breathing techniques or other breathing techniques.  To learn and demonstrate proper pursed lip breathing techniques or other breathing techniques.       Long  Term Goals Exhibits proper breathing techniques, such as pursed lip breathing or other method taught during program session Exhibits proper breathing techniques, such as pursed lip breathing or other method taught during program session      Comments Reviewed PLB technique with pt.  Talked about how it works and it's importance in maintaining their exercise saturations. Informed patient how to perform the Pursed Lipped breathing technique. Told patient to Inhale through the nose and out the mouth with pursed lips to keep their airways open, help oxygenate them better, practice when at rest or doing strenuous activity. Patient Verbalizes understanding of technique and will work on and be reiterated during [name].      Goals/Expected Outcomes Short: Become more profiecient at using PLB. Long: Become independent at using PLB. Short: use PLB with exertion. Long: use PLB on exertion proficiently and independently.               Oxygen Discharge (Final Oxygen Re-Evaluation):  Oxygen Re-Evaluation - 07/13/23 1116       Program Oxygen Prescription   Program Oxygen Prescription Continuous;E-Tanks    Liters per minute 4      Home Oxygen   Home Oxygen Device Portable Concentrator;Home Concentrator    Sleep Oxygen Prescription CPAP    Liters per minute 2    Home Exercise Oxygen Prescription Continuous    Liters per minute 4     Home Resting Oxygen Prescription Continuous    Liters per minute 2    Compliance with Home Oxygen Use Yes      Goals/Expected Outcomes   Short Term Goals To learn and demonstrate proper pursed lip breathing techniques or other breathing techniques.     Long  Term Goals Exhibits proper breathing techniques, such as pursed lip breathing or other method taught during program session    Comments Informed patient how to perform the Pursed Lipped breathing technique. Told patient to Inhale through the nose and out the mouth with pursed lips to keep their airways open, help oxygenate them better, practice when at rest or doing strenuous activity. Patient Verbalizes understanding of technique and will work on and be reiterated during [name].    Goals/Expected Outcomes Short: use PLB with exertion. Long: use PLB on exertion proficiently and independently.             Initial Exercise Prescription:  Initial Exercise Prescription - 06/29/23 1300       NuStep   METs 1.6             Perform Capillary Blood Glucose checks as needed.  Exercise Prescription Changes:   Exercise Prescription Changes     Row Name 06/12/23 1600 06/29/23 1300 07/11/23 1400 07/26/23 1000 08/10/23 0700     Response to Exercise   Blood Pressure (Admit) 150/90 114/62 128/70 126/70 132/64   Blood Pressure (Exercise) 150/80 130/64 -- 120/70 134/70   Blood Pressure (Exit) 112/80 120/70 114/68 118/60 124/68   Heart Rate (Admit) 117 bpm 103 bpm 103 bpm 114 bpm 105 bpm   Heart Rate (Exercise) 134 bpm 124 bpm 119 bpm 115 bpm 115 bpm   Heart Rate (Exit) 117 bpm 97 bpm 102 bpm 78 bpm 104 bpm   Oxygen Saturation (Admit) 90 % 92 % 94 % 92 % 93 %  Oxygen Saturation (Exercise) 85 % 87 % 88 % 92 % 88 %   Oxygen Saturation (Exit) 91 % 94 % 96 % 93 % 96 %   Rating of Perceived Exertion (Exercise) 18 17 14 13 13    Perceived Dyspnea (Exercise) 3 4 3  0 3   Symptoms Lightheadedness at end of none none none none    Comments results First two days of exercise -- -- --   Duration -- Progress to 30 minutes of  aerobic without signs/symptoms of physical distress Progress to 30 minutes of  aerobic without signs/symptoms of physical distress Progress to 30 minutes of  aerobic without signs/symptoms of physical distress Progress to 30 minutes of  aerobic without signs/symptoms of physical distress   Intensity -- THRR unchanged THRR unchanged THRR unchanged THRR unchanged     Progression   Progression -- Continue to progress workloads to maintain intensity without signs/symptoms of physical distress. Continue to progress workloads to maintain intensity without signs/symptoms of physical distress. Continue to progress workloads to maintain intensity without signs/symptoms of physical distress. Continue to progress workloads to maintain intensity without signs/symptoms of physical distress.   Average METs -- 1.75 2.04 2.7 2.94     Resistance Training   Training Prescription -- Yes Yes Yes Yes   Weight -- 2lb 2 lb 2 lb 2 lb   Reps -- 10-15 10-15 10-15 10-15     Interval Training   Interval Training -- No No No No     Oxygen   Oxygen -- Continuous Continuous Continuous Continuous   Liters -- 3-4 4 4 4      Treadmill   MPH -- 1.2 1 -- --   Grade -- 0 0 -- --   Minutes -- 10 15 -- --   METs -- 1.92 1.77 -- --     Recumbant Bike   Level -- -- 1 1 1    Watts -- -- 25 25 25    Minutes -- -- 15 15 15    METs -- -- 2.92 2.93 2.94     NuStep   Level -- 2  T6 3 2 2    Minutes -- 15 15 15 15    METs -- -- 2 2.4 --     REL-XR   Level -- -- -- -- 1   Minutes -- -- -- -- 15     T5 Nustep   Level -- -- 1 -- 1   Minutes -- -- 15 -- 15   METs -- -- 1.7 -- --     Oxygen   Maintain Oxygen Saturation -- 88% or higher 88% or higher 88% or higher --            Exercise Comments:   Exercise Comments     Row Name 06/13/23 1015           Exercise Comments First full day of exercise!  Patient was  oriented to gym and equipment including functions, settings, policies, and procedures.  Patient's individual exercise prescription and treatment plan were reviewed.  All starting workloads were established based on the results of the 6 minute walk test done at initial orientation visit.  The plan for exercise progression was also introduced and progression will be customized based on patient's performance and goals.                Exercise Goals and Review:   Exercise Goals     Row Name 06/12/23 [phone]  Exercise Goals   Increase Physical Activity Yes       Intervention Provide advice, education, support and counseling about physical activity/exercise needs.;Develop an individualized exercise prescription for aerobic and resistive training based on initial evaluation findings, risk stratification, comorbidities and participant's personal goals.       Expected Outcomes Short Term: Attend rehab on a regular basis to increase amount of physical activity.;Long Term: Exercising regularly at least 3-5 days a week.;Long Term: Add in home exercise to make exercise part of routine and to increase amount of physical activity.       Increase Strength and Stamina Yes       Intervention Provide advice, education, support and counseling about physical activity/exercise needs.;Develop an individualized exercise prescription for aerobic and resistive training based on initial evaluation findings, risk stratification, comorbidities and participant's personal goals.       Expected Outcomes Short Term: Increase workloads from initial exercise prescription for resistance, speed, and METs.;Short Term: Perform resistance training exercises routinely during rehab and add in resistance training at home;Long Term: Improve cardiorespiratory fitness, muscular endurance and strength as measured by increased METs and functional capacity ( )       Able to understand and use rate of perceived exertion (RPE)  scale Yes       Intervention Provide education and explanation on how to use RPE scale       Expected Outcomes Short Term: Able to use RPE daily in rehab to express subjective intensity level;Long Term:  Able to use RPE to guide intensity level when exercising independently       Able to understand and use Dyspnea scale Yes       Intervention Provide education and explanation on how to use Dyspnea scale       Expected Outcomes Short Term: Able to use Dyspnea scale daily in rehab to express subjective sense of shortness of breath during exertion;Long Term: Able to use Dyspnea scale to guide intensity level when exercising independently       Knowledge and understanding of Target Heart Rate Range (THRR) Yes       Intervention Provide education and explanation of THRR including how the numbers were predicted and where they are located for reference       Expected Outcomes Short Term: Able to state/look up THRR;Short Term: Able to use daily as guideline for intensity in rehab;Long Term: Able to use THRR to govern intensity when exercising independently       Able to check pulse independently Yes       Intervention Provide education and demonstration on how to check pulse in carotid and radial arteries.;Review the importance of being able to check your own pulse for safety during independent exercise       Expected Outcomes Short Term: Able to explain why pulse checking is important during independent exercise;Long Term: Able to check pulse independently and accurately       Understanding of Exercise Prescription Yes       Intervention Provide education, explanation, and written materials on patient's individual exercise prescription       Expected Outcomes Short Term: Able to explain program exercise prescription;Long Term: Able to explain home exercise prescription to exercise independently                Exercise Goals Re-Evaluation :  Exercise Goals Re-Evaluation     Row Name 06/13/23 1016  06/29/23 1345 07/11/23 1423 07/26/23 1046 08/10/23 0709     Exercise Goal Re-Evaluation  Exercise Goals Review Understanding of Exercise Prescription;Knowledge and understanding of Target Heart Rate Range (THRR);Able to understand and use Dyspnea scale;Able to understand and use rate of perceived exertion (RPE) scale Increase Physical Activity;Increase Strength and Stamina;Understanding of Exercise Prescription Increase Physical Activity;Increase Strength and Stamina;Understanding of Exercise Prescription Increase Physical Activity;Increase Strength and Stamina;Understanding of Exercise Prescription Increase Physical Activity;Increase Strength and Stamina;Understanding of Exercise Prescription   Comments Reviewed RPE  and dyspnea scale, THR and program prescription with pt today.  Pt voiced understanding and was given a copy of goals to take home.       Short: Use RPE daily to regulate intensity.  Long: Follow program prescription in THR. Stacie [name] is off to a good start in the program. She is doing well on the treadmill as she was able to walk at a speed of 1.2 mph for 10 minutes with no incline. She also improved to level 2 on the T6 nustep during her second session. We will continue to monitor her progress in the program. Stacie [name] is doing well in rehab. She improved to level 3 on the T4 nustep and worked at level 1 on the recumbent bike and T5 nustep. She did see a decrease in her speed on the treadmill from 1.2 mph to 1.0 mph. We will continue to monitor her progress in the program. Stacie [name] is doing well in rehab. She only attended one session during this review. In that one session she was able to maintain her intensity of level 1 on the recumbent bike. She also decreased her level on the T4 nustep from 3 back down to 2. We will continue to monitor her progress in the program. Stacie [name] continues to do well in rehab. She only attended two sessions during the time of this review. In those two sessions she was able to  add the XR, at level 1, to her current exercise prescription. We will continue to monitor her progress in the program.   Expected Outcomes Short: Use RPE daily to regulate intensity. Long: Follow program prescription in THR. Short: Continue to follow current exercise prescription. Long: Continue exercise to improve strength and stamina. Short: Increase treadmill speed back up to 1.2 mph. Long: Continue exercise to improve strength and stamina. Short: Increase level on the T4 nustep back to 3, try level 2 on the recumbent bike. Long: Continue exercise to improve strength and stamina. Short: Increase level on the T4 nustep back to 3, try level 2 on the recumbent bike. Long: Continue exercise to improve strength and stamina.            Discharge Exercise Prescription (Final Exercise Prescription Changes):  Exercise Prescription Changes - 08/10/23 0700       Response to Exercise   Blood Pressure (Admit) 132/64    Blood Pressure (Exercise) 134/70    Blood Pressure (Exit) 124/68    Heart Rate (Admit) 105 bpm    Heart Rate (Exercise) 115 bpm    Heart Rate (Exit) 104 bpm    Oxygen Saturation (Admit) 93 %    Oxygen Saturation (Exercise) 88 %    Oxygen Saturation (Exit) 96 %    Rating of Perceived Exertion (Exercise) 13    Perceived Dyspnea (Exercise) 3    Symptoms none    Duration Progress to 30 minutes of  aerobic without signs/symptoms of physical distress    Intensity THRR unchanged      Progression   Progression Continue to progress workloads to maintain intensity without signs/symptoms of physical distress.  Average METs 2.94      Resistance Training   Training Prescription Yes    Weight 2 lb    Reps 10-15      Interval Training   Interval Training No      Oxygen   Oxygen Continuous    Liters 4      Recumbant Bike   Level 1    Watts 25    Minutes 15    METs 2.94      NuStep   Level 2    Minutes 15      REL-XR   Level 1    Minutes 15      T5 Nustep   Level 1     Minutes 15             Nutrition:  Target Goals: Understanding of nutrition guidelines, daily intake of sodium 1500mg , cholesterol 200mg , calories 30% from fat and 7% or less from saturated fats, daily to have 5 or more servings of fruits and vegetables.  Education: All About Nutrition: -Group instruction provided by verbal, written material, interactive activities, discussions, models, and posters to present general guidelines for heart healthy nutrition including fat, fiber, MyPlate, the role of sodium in heart healthy nutrition, utilization of the nutrition label, and utilization of this knowledge for meal planning. Follow up email sent as well. Written material given at graduation. Flowsheet Row Pulmonary Rehab from 08/10/2018 in [facility] Cardiac and Pulmonary Rehab  Date 06/27/18  Educator LB  Instruction Review Code 1- Verbalizes Understanding       Biometrics:  Pre Biometrics - 06/12/23 1644       Pre Biometrics   Height 5\' 7"  (1.702 m)    Weight 187 lb 11.2 oz (85.1 kg)    Waist Circumference 44.5 inches    Hip Circumference 49 inches    Waist to Hip Ratio 0.91 %    BMI (Calculated) 29.39    Single Leg Stand 3.6 seconds              Nutrition Therapy Plan and Nutrition Goals:   Nutrition Assessments:  MEDIFICTS Score Key: >=70 Need to make dietary changes  40-70 Heart Healthy Diet <= 40 Therapeutic Level Cholesterol Diet  Flowsheet Row Pulmonary Rehab from 06/12/2023 in [facility] Cardiac and Pulmonary Rehab  Picture Your Plate Total Score on Admission 64      Picture Your Plate Scores: <16 Unhealthy dietary pattern with much room for improvement. 41-50 Dietary pattern unlikely to meet recommendations for good health and room for improvement. 51-60 More healthful dietary pattern, with some room for improvement.  >60 Healthy dietary pattern, although there may be some specific behaviors that could be improved.   Nutrition Goals Re-Evaluation:  Nutrition  Goals Re-Evaluation     Row Name 07/13/23 1121             Goals   Current Weight 185 lb (83.9 kg)       Comment Patient was informed on why it is important to maintain a balanced diet when dealing with Respiratory issues. Explained that it takes a lot of energy to breath and when they are short of breath often they will need to have a good diet to help keep up with the calories they are expending for breathing.       Expected Outcome Short: Choose and plan snacks accordingly to patients caloric intake to improve breathing. Long: Maintain a diet independently that meets their caloric intake to aid in daily shortness of  breath.                Nutrition Goals Discharge (Final Nutrition Goals Re-Evaluation):  Nutrition Goals Re-Evaluation - 07/13/23 1121       Goals   Current Weight 185 lb (83.9 kg)    Comment Patient was informed on why it is important to maintain a balanced diet when dealing with Respiratory issues. Explained that it takes a lot of energy to breath and when they are short of breath often they will need to have a good diet to help keep up with the calories they are expending for breathing.    Expected Outcome Short: Choose and plan snacks accordingly to patients caloric intake to improve breathing. Long: Maintain a diet independently that meets their caloric intake to aid in daily shortness of breath.             Psychosocial: Target Goals: Acknowledge presence or absence of significant depression and/or stress, maximize coping skills, provide positive support system. Participant is able to verbalize types and ability to use techniques and skills needed for reducing stress and depression.   Education: Stress, Anxiety, and Depression - Group verbal and visual presentation to define topics covered.  Reviews how body is impacted by stress, anxiety, and depression.  Also discusses healthy ways to reduce stress and to treat/manage anxiety and depression.  Written  material given at graduation. Flowsheet Row Pulmonary Rehab from 08/10/2022 in [facility] Cardiac and Pulmonary Rehab  Date 08/10/22  Educator [facility]  Instruction Review Code 1- [employer] Understanding       Education: Sleep Hygiene -Provides group verbal and written instruction about how sleep can affect your health.  Define sleep hygiene, discuss sleep cycles and impact of sleep habits. Review good sleep hygiene tips.  Flowsheet Row Pulmonary Rehab from 08/10/2018 in [facility] Cardiac and Pulmonary Rehab  Date 04/11/18  Educator [facility]  Instruction Review Code 1- Verbalizes Understanding       Initial Review & Psychosocial Screening:  Initial Psych Review & Screening - 04/26/23 1017       Initial Review   Current issues with None Identified      Family Dynamics   Good Support System? Yes   husband, daughters, family     Barriers   Psychosocial barriers to participate in program There are no identifiable barriers or psychosocial needs.;The patient should benefit from training in stress management and relaxation.      Screening Interventions   Interventions Encouraged to exercise;Provide feedback about the scores to participant    Expected Outcomes Short Term goal: Utilizing psychosocial counselor, staff and physician to assist with identification of specific Stressors or current issues interfering with healing process. Setting desired goal for each stressor or current issue identified.;Long Term Goal: Stressors or current issues are controlled or eliminated.;Short Term goal: Identification and review with participant of any Quality of Life or Depression concerns found by scoring the questionnaire.;Long Term goal: The participant improves quality of Life and PHQ9 Scores as seen by post scores and/or verbalization of changes             Quality of Life Scores:  Scores of 19 and below usually indicate a poorer quality of life in these areas.  A difference of  2-3 points is a clinically  meaningful difference.  A difference of 2-3 points in the total score of the Quality of Life Index has been associated with significant improvement in overall quality of life, self-image, physical symptoms, and general health in studies  assessing change in quality of life.  PHQ-9: Review Flowsheet  More data exists      06/12/2023 07/20/2022 06/24/2022 10/13/2021 09/28/2021  Depression screen PHQ 2/9  Decreased Interest 1 0 0 0 0  Down, Depressed, Hopeless 0 0 0 0 0  PHQ - 2 Score 1 0 0 0 0  Altered sleeping 0 1 0 0 0  Tired, decreased energy 2 1 0 0 0  Change in appetite 0 0 0 0 0  Feeling bad or failure about yourself  0 0 0 0 0  Trouble concentrating 0 1 0 0 0  Moving slowly or fidgety/restless 0 0 0 0 0  Suicidal thoughts 0 0 0 0 0  PHQ-9 Score 3 3 0 0 0  Difficult doing work/chores Somewhat difficult Not difficult at all Not difficult at all Not difficult at all Not difficult at all    Details           Interpretation of Total Score  Total Score Depression Severity:  1-4 = Minimal depression, 5-9 = Mild depression, 10-14 = Moderate depression, 15-19 = Moderately severe depression, 20-27 = Severe depression   Psychosocial Evaluation and Intervention:  Psychosocial Evaluation - 04/26/23 1018       Psychosocial Evaluation & Interventions   Interventions Encouraged to exercise with the program and follow exercise prescription    Comments Stacie [name] is returning to pulmonary rehab after having to discharge early in January due to health concerns. Since then, her cardiac work up has been completed with no concerns. She has noticed her breathing has gotten worse because she has not been as active. She also notes that her legs are weak and her stamina has decreased. She recently fell and doesn't really know if it was related to blacking out with bending over movement or something else. She sees her doctor this Friday. She is motivated to return and wants to be able to keep up with her  family. When asked about stress, she states she has a great outlook on life thanks to her family and everyone is doing well.    Expected Outcomes Short: attend pulmonary rehab for education and exercise. Long: develop and maintain positive self care habits.    Continue Psychosocial Services  Follow up required by staff             Psychosocial Re-Evaluation:  Psychosocial Re-Evaluation     Row Name 07/13/23 1119             Psychosocial Re-Evaluation   Current issues with Current Psychotropic Meds;Current Stress Concerns       Comments Her depression and stress concerns stem from her health. She thinks she is doing better with her mental health with all her life changes. Her daughter helps her alot and is trying to give her daughter time to have a life for herself.       Expected Outcomes Short; Conitnue to exercise for menatl boost Long: Continue to stay positive       Interventions Encouraged to attend Pulmonary Rehabilitation for the exercise       Continue Psychosocial Services  Follow up required by staff                Psychosocial Discharge (Final Psychosocial Re-Evaluation):  Psychosocial Re-Evaluation - 07/13/23 1119       Psychosocial Re-Evaluation   Current issues with Current Psychotropic Meds;Current Stress Concerns    Comments Her depression and stress concerns stem from her health. She thinks she  is doing better with her mental health with all her life changes. Her daughter helps her alot and is trying to give her daughter time to have a life for herself.    Expected Outcomes Short; Conitnue to exercise for menatl boost Long: Continue to stay positive    Interventions Encouraged to attend Pulmonary Rehabilitation for the exercise    Continue Psychosocial Services  Follow up required by staff             Education: Education Goals: Education classes will be provided on a weekly basis, covering required topics. Participant will state understanding/return  demonstration of topics presented.  Learning Barriers/Preferences:  Learning Barriers/Preferences - 04/26/23 1008       Learning Barriers/Preferences   Learning Barriers None    Learning Preferences None             General Pulmonary Education Topics:  Infection Prevention: - Provides verbal and written material to individual with discussion of infection control including proper hand washing and proper equipment cleaning during exercise session. Flowsheet Row Pulmonary Rehab from 06/12/2023 in [facility] Cardiac and Pulmonary Rehab  Date 06/12/23  Educator MB  Instruction Review Code 1- Verbalizes Understanding       Falls Prevention: - Provides verbal and written material to individual with discussion of falls prevention and safety. Flowsheet Row Pulmonary Rehab from 06/12/2023 in [facility] Cardiac and Pulmonary Rehab  Date 06/12/23  Educator MB  Instruction Review Code 1- Verbalizes Understanding       Chronic Lung Disease Review: - Group verbal instruction with posters, models, PowerPoint presentations and videos,  to review new updates, new respiratory medications, new advancements in procedures and treatments. Providing information on websites and "800" numbers for continued self-education. Includes information about supplement oxygen, available portable oxygen systems, continuous and intermittent flow rates, oxygen safety, concentrators, and Medicare reimbursement for oxygen. Explanation of Pulmonary Drugs, including class, frequency, complications, importance of spacers, rinsing mouth after steroid MDI's, and proper cleaning methods for nebulizers. Review of basic lung anatomy and physiology related to function, structure, and complications of lung disease. Review of risk factors. Discussion about methods for diagnosing sleep apnea and types of masks and machines for OSA. Includes a review of the use of types of environmental controls: home humidity, furnaces, filters, dust mite/pet  prevention, HEPA vacuums. Discussion about weather changes, air quality and the benefits of nasal washing. Instruction on Warning signs, infection symptoms, calling MD promptly, preventive modes, and value of vaccinations. Review of effective airway clearance, coughing and/or vibration techniques. Emphasizing that all should Create an Action Plan. Written material given at graduation. Flowsheet Row Pulmonary Rehab from 08/10/2018 in [facility] Cardiac and Pulmonary Rehab  Date 06/29/18  Educator [facility]  Instruction Review Code 1- Verbalizes Understanding       AED/CPR: - Group verbal and written instruction with the use of models to demonstrate the basic use of the AED with the basic ABC's of resuscitation.    Anatomy and Cardiac Procedures: - Group verbal and visual presentation and models provide information about basic cardiac anatomy and function. Reviews the testing methods done to diagnose heart disease and the outcomes of the test results. Describes the treatment choices: Medical Management, Angioplasty, or Coronary Bypass Surgery for treating various heart conditions including Myocardial Infarction, Angina, Valve Disease, and Cardiac Arrhythmias.  Written material given at graduation. Flowsheet Row Pulmonary Rehab from 08/10/2018 in [facility] Cardiac and Pulmonary Rehab  Date 05/16/18  Educator [facility]  Instruction Review Code 5- Refused Teaching  Medication Safety: - Group verbal and visual instruction to review commonly prescribed medications for heart and lung disease. Reviews the medication, class of the drug, and side effects. Includes the steps to properly store meds and maintain the prescription regimen.  Written material given at graduation. Flowsheet Row Pulmonary Rehab from 08/10/2018 in [facility] Cardiac and Pulmonary Rehab  Date 08/10/18  Educator [facility]  Instruction Review Code 1- Verbalizes Understanding       Other: -Provides group and verbal instruction on various topics (see  comments)   Knowledge Questionnaire Score:  Knowledge Questionnaire Score - 06/12/23 1624       Knowledge Questionnaire Score   Pre Score 18/18              Core Components/Risk Factors/Patient Goals at Admission:  Personal Goals and Risk Factors at Admission - 06/12/23 1646       Core Components/Risk Factors/Patient Goals on Admission    Weight Management Yes;Weight Maintenance;Weight Loss    Intervention Weight Management: Develop a combined nutrition and exercise program designed to reach desired caloric intake, while maintaining appropriate intake of nutrient and fiber, sodium and fats, and appropriate energy expenditure required for the weight goal.;Weight Management: Provide education and appropriate resources to help participant work on and attain dietary goals.;Weight Management/Obesity: Establish reasonable short term and long term weight goals.    Admit Weight 187 lb 11.2 oz (85.1 kg)    Goal Weight: Short Term 172 lb (78 kg)    Goal Weight: Long Term 150 lb (68 kg)    Expected Outcomes Short Term: Continue to assess and modify interventions until short term weight is achieved;Long Term: Adherence to nutrition and physical activity/exercise program aimed toward attainment of established weight goal;Weight Loss: Understanding of general recommendations for a balanced deficit meal plan, which promotes 1-2 lb weight loss per week and includes a negative energy balance of [phone] kcal/d;Understanding recommendations for meals to include 15-35% energy as protein, 25-35% energy from fat, 35-60% energy from carbohydrates, less than 200mg  of dietary cholesterol, 20-35 gm of total fiber daily;Understanding of distribution of calorie intake throughout the day with the consumption of 4-5 meals/snacks    Improve shortness of breath with ADL's Yes    Intervention Provide education, individualized exercise plan and daily activity instruction to help decrease symptoms of SOB with activities  of daily living.    Expected Outcomes Short Term: Improve cardiorespiratory fitness to achieve a reduction of symptoms when performing ADLs;Long Term: Be able to perform more ADLs without symptoms or delay the onset of symptoms    Hypertension Yes    Intervention Provide education on lifestyle modifcations including regular physical activity/exercise, weight management, moderate sodium restriction and increased consumption of fresh fruit, vegetables, and low fat dairy, alcohol moderation, and smoking cessation.;Monitor prescription use compliance.    Expected Outcomes Short Term: Continued assessment and intervention until BP is < 140/33mm HG in hypertensive participants. < 130/45mm HG in hypertensive participants with diabetes, heart failure or chronic kidney disease.;Long Term: Maintenance of blood pressure at goal levels.    Lipids Yes    Intervention Provide education and support for participant on nutrition & aerobic/resistive exercise along with prescribed medications to achieve LDL 70mg , HDL >40mg .    Expected Outcomes Short Term: Participant states understanding of desired cholesterol values and is compliant with medications prescribed. Participant is following exercise prescription and nutrition guidelines.;Long Term: Cholesterol controlled with medications as prescribed, with individualized exercise RX and with personalized nutrition plan. Value goals: LDL < 70mg , HDL >  40 mg.             Education:Diabetes - Individual verbal and written instruction to review signs/symptoms of diabetes, desired ranges of glucose level fasting, after meals and with exercise. Acknowledge that pre and post exercise glucose checks will be done for 3 sessions at entry of program.   Know Your Numbers and Heart Failure: - Group verbal and visual instruction to discuss disease risk factors for cardiac and pulmonary disease and treatment options.  Reviews associated critical values for Overweight/Obesity,  Hypertension, Cholesterol, and Diabetes.  Discusses basics of heart failure: signs/symptoms and treatments.  Introduces Heart Failure Zone chart for action plan for heart failure.  Written material given at graduation. Flowsheet Row Pulmonary Rehab from 08/10/2022 in [facility] Cardiac and Pulmonary Rehab  Date 07/27/22  Educator SB  Instruction Review Code 1- Verbalizes Understanding       Core Components/Risk Factors/Patient Goals Review:   Goals and Risk Factor Review     Row Name 07/13/23 1118             Core Components/Risk Factors/Patient Goals Review   Personal Goals Review Improve shortness of breath with ADL's       Review Spoke to patient about their shortness of breath and what they can do to improve. Patient has been informed of breathing techniques when starting the program. Patient is informed to tell staff if they have had any med changes and that certain meds they are taking or not taking can be causing shortness of breath.       Expected Outcomes Short: Attend [name] regularly to improve shortness of breath with ADL's. Long: maintain independence with ADL's                Core Components/Risk Factors/Patient Goals at Discharge (Final Review):   Goals and Risk Factor Review - 07/13/23 1118       Core Components/Risk Factors/Patient Goals Review   Personal Goals Review Improve shortness of breath with ADL's    Review Spoke to patient about their shortness of breath and what they can do to improve. Patient has been informed of breathing techniques when starting the program. Patient is informed to tell staff if they have had any med changes and that certain meds they are taking or not taking can be causing shortness of breath.    Expected Outcomes Short: Attend [name] regularly to improve shortness of breath with ADL's. Long: maintain independence with ADL's             ITP Comments:  ITP Comments     Row Name 04/26/23 1016 06/12/23 1633 06/13/23 1015  06/21/23 1042 07/19/23 1218   ITP Comments Initial phone call completed. Diagnosis can be found in [facility] 5/7. EP Orientation scheduled for Wednesday 7/17 at 9:30. Completed and gym orientation. Initial ITP created and sent for review to Vida Rigger, Medical Director. First full day of exercise!  Patient was oriented to gym and equipment including functions, settings, policies, and procedures.  Patient's individual exercise prescription and treatment plan were reviewed.  All starting workloads were established based on the results of the 6 minute walk test done at initial orientation visit.  The plan for exercise progression was also introduced and progression will be customized based on patient's performance and goals. 30 Day review completed. Medical Director ITP review done, changes made as directed, and signed approval by Medical Director.     new to program 30 Day review completed. Medical Director ITP review done,  changes made as directed, and signed approval by Medical Director.    Row Name 08/16/23 1308           ITP Comments 30 Day review completed. Medical Director ITP review done, changes made as directed, and signed approval by Medical Director.                Comments:

## 2023-08-17 ENCOUNTER — Encounter: Payer: BC Managed Care – PPO | Admitting: *Deleted

## 2023-08-22 ENCOUNTER — Encounter: Payer: BC Managed Care – PPO | Attending: Pulmonary Disease | Admitting: *Deleted

## 2023-08-22 DIAGNOSIS — D860 Sarcoidosis of lung: Secondary | ICD-10-CM | POA: Insufficient documentation

## 2023-08-22 NOTE — Progress Notes (Signed)
Daily Session Note  Patient Details  Name: Stacie [name] MRN: [id number] Date of Birth: [birth year] Referring Provider:   Flowsheet Row Pulmonary Rehab from 06/12/2023 in [facility] Cardiac and Pulmonary Rehab  Referring Provider Vida Rigger       Encounter Date: 08/22/2023  Check In:  Session Check In - 08/22/23 1105       Check-In   Supervising physician immediately available to respond to emergencies See telemetry face sheet for immediately available ER MD    Location [facility]-Cardiac & Pulmonary Rehab    Staff Present Cora Collum, RN, BSN, CCRP;Meredith Jewel Baize, RN BSN;Noah Tickle, BS, Exercise Physiologist;Maxon Conetta BS, , Exercise Physiologist    Virtual Visit No    Medication changes reported     No    Fall or balance concerns reported    No    Warm-up and Cool-down Performed on first and last piece of equipment    Resistance Training Performed Yes    VAD Patient? No    PAD/SET Patient? No      Pain Assessment   Currently in Pain? No/denies                Social History   Tobacco Use  Smoking Status Never  Smokeless Tobacco Never    Goals Met:  Proper associated with RPD/PD & O2 Sat Independence with exercise equipment Exercise tolerated well No report of concerns or symptoms today  Goals Unmet:  Not Applicable  Comments: Pt able to follow exercise prescription today without complaint.  Will continue to monitor for progression.    Dr. Bethann Punches is Medical Director for [facility] Cardiac Rehabilitation.  Dr. Vida Rigger is Medical Director for [facility] Pulmonary Rehabilitation.

## 2023-08-24 ENCOUNTER — Encounter: Payer: BC Managed Care – PPO | Admitting: *Deleted

## 2023-08-24 DIAGNOSIS — G4730 Sleep apnea, unspecified: Secondary | ICD-10-CM | POA: Diagnosis not present

## 2023-08-24 DIAGNOSIS — I10 Essential (primary) hypertension: Secondary | ICD-10-CM | POA: Diagnosis not present

## 2023-08-24 DIAGNOSIS — G4733 Obstructive sleep apnea (adult) (pediatric): Secondary | ICD-10-CM | POA: Diagnosis not present

## 2023-08-24 DIAGNOSIS — I272 Pulmonary hypertension, unspecified: Secondary | ICD-10-CM | POA: Diagnosis not present

## 2023-08-29 DIAGNOSIS — D860 Sarcoidosis of lung: Secondary | ICD-10-CM

## 2023-08-29 NOTE — Progress Notes (Signed)
Daily Session Note  Patient Details  Name: Stacie [name] MRN: [id number] Date of Birth: 1956/07/02 Referring Provider:   Flowsheet Row Pulmonary Rehab from 06/12/2023 in [facility] Cardiac and Pulmonary Rehab  Referring Provider Vida Rigger       Encounter Date: 08/29/2023  Check In:  Session Check In - 08/29/23 1104       Check-In   Supervising physician immediately available to respond to emergencies See telemetry face sheet for immediately available ER MD    Location [facility]-Cardiac & Pulmonary Rehab    Staff Present Susann Givens, RN BSN;Darline Faith Clover Mealy, RN, BSN;Jason Wallace Cullens, RDN, LDN;Maxon Conetta BS, , Exercise Physiologist    Virtual Visit No    Medication changes reported     No    Fall or balance concerns reported    No    Tobacco Cessation No Change    Warm-up and Cool-down Performed on first and last piece of equipment    Resistance Training Performed Yes    VAD Patient? No    PAD/SET Patient? No      Pain Assessment   Currently in Pain? No/denies                Social History   Tobacco Use  Smoking Status Never  Smokeless Tobacco Never    Goals Met:  Proper associated with RPD/PD & O2 Sat Independence with exercise equipment Using PLB without cueing & demonstrates good technique Exercise tolerated well No report of concerns or symptoms today Strength training completed today  Goals Unmet:  Not Applicable  Comments: Pt able to follow exercise prescription today without complaint.  Will continue to monitor for progression.   Dr. Bethann Punches is Medical Director for [facility] Cardiac Rehabilitation.  Dr. Vida Rigger is Medical Director for [facility] Pulmonary Rehabilitation.

## 2023-08-31 ENCOUNTER — Encounter: Payer: BC Managed Care – PPO | Admitting: *Deleted

## 2023-08-31 DIAGNOSIS — D860 Sarcoidosis of lung: Secondary | ICD-10-CM

## 2023-08-31 NOTE — Progress Notes (Signed)
Daily Session Note  Patient Details  Name: Stacie [name] MRN: [id number] Date of Birth: June 30, 1956 Referring Provider:   Flowsheet Row Pulmonary Rehab from 06/12/2023 in [facility] Cardiac and Pulmonary Rehab  Referring Provider Vida Rigger       Encounter Date: 08/31/2023  Check In:  Session Check In - 08/31/23 1038       Check-In   Supervising physician immediately available to respond to emergencies See telemetry face sheet for immediately available ER MD    Location [facility]-Cardiac & Pulmonary Rehab    Staff Present Cora Collum, RN, BSN, CCRP;Meredith Jewel Baize, RN BSN;Joseph [place], Guinevere Ferrari, RN, [place]    Virtual Visit No    Medication changes reported     No    Fall or balance concerns reported    No    Warm-up and Cool-down Performed on first and last piece of equipment    Resistance Training Performed Yes    VAD Patient? No    PAD/SET Patient? No      Pain Assessment   Currently in Pain? No/denies                Social History   Tobacco Use  Smoking Status Never  Smokeless Tobacco Never    Goals Met:  Independence with exercise equipment Exercise tolerated well No report of concerns or symptoms today Strength training completed today  Goals Unmet:  Not Applicable  Comments: Pt able to follow exercise prescription today without complaint.  Will continue to monitor for progression.    Dr. Bethann Punches is Medical Director for [facility] Cardiac Rehabilitation.  Dr. Vida Rigger is Medical Director for [facility] Pulmonary Rehabilitation.

## 2023-09-05 ENCOUNTER — Encounter: Payer: BC Managed Care – PPO | Admitting: *Deleted

## 2023-09-05 DIAGNOSIS — D860 Sarcoidosis of lung: Secondary | ICD-10-CM

## 2023-09-05 NOTE — Progress Notes (Signed)
Daily Session Note  Patient Details  Name: Stacie [name] MRN: [id number] Date of Birth: 03/18/1956 Referring Provider:   Flowsheet Row Pulmonary Rehab from 06/12/2023 in [facility] Cardiac and Pulmonary Rehab  Referring Provider Vida Rigger       Encounter Date: 09/05/2023  Check In:  Session Check In - 09/05/23 1509       Check-In   Supervising physician immediately available to respond to emergencies See telemetry face sheet for immediately available ER MD    Location [facility]-Cardiac & Pulmonary Rehab    Staff Present Susann Givens, RN BSN;Maxon Conetta BS, , Exercise Physiologist;Susanne Bice, RN, BSN, CCRP;Jason Gray, RDN, LDN    Virtual Visit No    Medication changes reported     No    Fall or balance concerns reported    No    Warm-up and Cool-down Performed on first and last piece of equipment    Resistance Training Performed Yes    VAD Patient? No    PAD/SET Patient? No      Pain Assessment   Currently in Pain? No/denies                Social History   Tobacco Use  Smoking Status Never  Smokeless Tobacco Never    Goals Met:  Independence with exercise equipment Exercise tolerated well No report of concerns or symptoms today Strength training completed today  Goals Unmet:  Not Applicable  Comments: Pt able to follow exercise prescription today without complaint.  Will continue to monitor for progression.    Dr. Bethann Punches is Medical Director for [facility] Cardiac Rehabilitation.  Dr. Vida Rigger is Medical Director for [facility] Pulmonary Rehabilitation.

## 2023-09-06 ENCOUNTER — Encounter: Payer: Self-pay | Admitting: *Deleted

## 2023-09-06 ENCOUNTER — Ambulatory Visit: Payer: BC Managed Care – PPO | Admitting: Internal Medicine

## 2023-09-06 DIAGNOSIS — D860 Sarcoidosis of lung: Secondary | ICD-10-CM

## 2023-09-06 NOTE — Progress Notes (Signed)
Pulmonary Individual Treatment Plan  Patient Details  Name: Stacie [name] MRN: [id number] Date of Birth: May 17, 1956 Referring Provider:   Flowsheet Row Pulmonary Rehab from 06/12/2023 in [facility] Cardiac and Pulmonary Rehab  Referring Provider Vida Rigger       Initial Encounter Date:  Flowsheet Row Pulmonary Rehab from 06/12/2023 in [facility] Cardiac and Pulmonary Rehab  Date 06/12/23       Visit Diagnosis: Sarcoidosis of lung (HCC)  Patient's Home Medications on Admission:  Current Outpatient Medications:    acetaminophen (TYLENOL) 500 MG tablet, Take 500 mg by mouth every 6 (six) hours as needed for moderate pain or headache., Disp: , Rfl:    albuterol (ACCUNEB) 1.25 MG/3ML nebulizer solution, Take 1 ampule by nebulization as needed., Disp: , Rfl:    azithromycin (ZITHROMAX) 250 MG tablet, Take 250 mg by mouth 3 (three) times a week., Disp: , Rfl:    budesonide (PULMICORT) 0.25 MG/2ML nebulizer solution, Take 0.25 mg by nebulization daily as needed., Disp: , Rfl:    denosumab (PROLIA) 60 MG/ML SOSY injection, Inject 60 mg into the skin every 6 (six) months., Disp: , Rfl:    dorzolamide (TRUSOPT) 2 % ophthalmic solution, Place 1 drop into both eyes 2 (two) times daily., Disp: , Rfl:    escitalopram (LEXAPRO) 20 MG tablet, TAKE 1 TABLET BY MOUTH EVERY DAY, Disp: 90 tablet, Rfl: 3   Fluticasone-Umeclidin-Vilant (TRELEGY ELLIPTA) 200-62.5-25 MCG/ACT AEPB, Inhale into the lungs daily., Disp: , Rfl:    latanoprost (XALATAN) 0.005 % ophthalmic solution, Place 1 drop into the right eye 2 (two) times daily., Disp: , Rfl:    levothyroxine (SYNTHROID) 137 MCG tablet, Take 1 tablet (137 mcg total) by mouth daily before breakfast., Disp: 90 tablet, Rfl: 3   Multiple Vitamins-Minerals (MULTIVITAMIN WITH MINERALS) tablet, Take 1 tablet by mouth daily., Disp: , Rfl:    naproxen sodium (ALEVE) 220 MG tablet, Take 220 mg by mouth daily as needed (pain)., Disp: , Rfl:    NON FORMULARY, CPAP nightly,  Disp: , Rfl:    OXYGEN, Inhale 2 L into the lungs daily as needed (During walking and activity). , Disp: , Rfl:    predniSONE (DELTASONE) 10 MG tablet, Take 2 tablets (20 mg total) by mouth daily. Resume only after the finishing new Rx for prednisone (Patient taking differently: Take 13 mg by mouth daily.), Disp: , Rfl:    Probiotic Product (PROBIOTIC PO), Take 1 capsule by mouth daily., Disp: , Rfl:    Treprostinil (TYVASO) 0.6 MG/ML SOLN, Inhale into the lungs See admin instructions. 12 breaths 4 times a day, Disp: , Rfl:   Past Medical History: Past Medical History:  Diagnosis Date   Acute renal failure (HCC)    Acute respiratory failure (HCC) 10/23/2021   Acute respiratory failure with hypoxia (HCC)    ARF (acute respiratory failure) (HCC) 10/19/2021   Arrhythmia    patient unaware if this is current   Asthma    Chronic kidney disease    Critical lower limb ischemia (HCC)    Depression    GERD (gastroesophageal reflux disease)    Heart murmur    History of kidney stones    HOH (hard of hearing)    wear aids   Hyperthyroidism    Hypothyroidism    IBS (irritable bowel syndrome)    Pneumonia    Pulmonary hypertension (HCC)    Sarcoid    Sarcoidosis    Seasonal allergies    Sleep apnea CPAP with O2  Stroke (HCC) 07/2020   watershed   Wears hearing aid in both ears     Tobacco Use: Social History   Tobacco Use  Smoking Status Never  Smokeless Tobacco Never    Labs: Review Flowsheet  More data exists      Latest Ref Rng & Units 07/21/2020 01/14/2021 10/19/2021 06/24/2022 12/23/2022  Labs for ITP Cardiac and Pulmonary Rehab  Cholestrol 0 - 200 mg/dL 161  - - - -  LDL (calc) 0 - 99 mg/dL 096  - - - -  HDL-C >04 mg/dL 33  - - - -  Trlycerides <150 mg/dL 540  - - - -  Hemoglobin A1c 4.8 - 5.6 % - - - 6.1  -  PH, Arterial 7.35 - 7.45 - - - - 7.413   PCO2 arterial 32 - 48 mmHg - - - - 44.3   Bicarbonate 20.0 - 28.0 mmol/L - - 31.7  - 28.3  30.0   TCO2 22 - 32 mmol/L - 30  - -  30  32   O2 Saturation % - - 33.6  - 95  65     Details       Multiple values from one day are sorted in reverse-chronological order          Pulmonary Assessment Scores:  Pulmonary Assessment Scores     Row Name 06/12/23 1620         ADL UCSD   ADL Phase Entry     SOB Score total 91     Rest 1     Walk 3     Stairs 5     Bath 3     Dress 4     Shop 5       CAT Score   CAT Score 18       mMRC Score   mMRC Score 3              UCSD: Self-administered rating of dyspnea associated with activities of daily living (ADLs) 6-point scale (0 = "not at all" to 5 = "maximal or unable to do because of breathlessness")  Scoring Scores range from 0 to 120.  Minimally important difference is 5 units  CAT: CAT can identify the health impairment of COPD patients and is better correlated with disease progression.  CAT has a scoring range of zero to 40. The CAT score is classified into four groups of low (less than 10), medium (10 - 20), high (21-30) and very high (31-40) based on the impact level of disease on health status. A CAT score over 10 suggests significant symptoms.  A worsening CAT score could be explained by an exacerbation, poor medication adherence, poor inhaler technique, or progression of COPD or comorbid conditions.  CAT MCID is 2 points  mMRC: mMRC (Modified Medical Research Council) Dyspnea Scale is used to assess the degree of baseline functional disability in patients of respiratory disease due to dyspnea. No minimal important difference is established. A decrease in score of 1 point or greater is considered a positive change.   Pulmonary Function Assessment:   Exercise Target Goals: Exercise Program Goal: Individual exercise prescription set using results from initial 6 min walk test and THRR while considering  patient's activity barriers and safety.   Exercise Prescription Goal: Initial exercise prescription builds to 30-45 minutes a day of aerobic  activity, 2-3 days per week.  Home exercise guidelines will be given to patient during program as part of exercise prescription  that the participant will acknowledge.  Education: Aerobic Exercise: - Group verbal and visual presentation on the components of exercise prescription. Introduces F.I.T.T principle from ACSM for exercise prescriptions.  Reviews F.I.T.T. principles of aerobic exercise including progression. Written material given at graduation. Flowsheet Row Pulmonary Rehab from 08/10/2018 in [facility] Cardiac and Pulmonary Rehab  Date 05/02/18  Educator [facility]  Instruction Review Code 1- Verbalizes Understanding       Education: Resistance Exercise: - Group verbal and visual presentation on the components of exercise prescription. Introduces F.I.T.T principle from ACSM for exercise prescriptions  Reviews F.I.T.T. principles of resistance exercise including progression. Written material given at graduation.    Education: Exercise & Equipment Safety: - Individual verbal instruction and demonstration of equipment use and safety with use of the equipment. Flowsheet Row Pulmonary Rehab from 06/12/2023 in [facility] Cardiac and Pulmonary Rehab  Date 06/12/23  Educator MB  Instruction Review Code 1- Verbalizes Understanding       Education: Exercise Physiology & General Exercise Guidelines: - Group verbal and written instruction with models to review the exercise physiology of the cardiovascular system and associated critical values. Provides general exercise guidelines with specific guidelines to those with heart or lung disease.  Flowsheet Row Pulmonary Rehab from 08/10/2018 in [facility] Cardiac and Pulmonary Rehab  Date 07/11/18  Educator [facility]  Instruction Review Code 1- Verbalizes Understanding       Education: Flexibility, Balance, Mind/Body Relaxation: - Group verbal and visual presentation with interactive activity on the components of exercise prescription. Introduces F.I.T.T principle from  ACSM for exercise prescriptions. Reviews F.I.T.T. principles of flexibility and balance exercise training including progression. Also discusses the mind body connection.  Reviews various relaxation techniques to help reduce and manage stress (i.e. Deep breathing, progressive muscle relaxation, and visualization). Balance handout provided to take home. Written material given at graduation. Flowsheet Row Pulmonary Rehab from 08/10/2018 in [facility] Cardiac and Pulmonary Rehab  Date 08/08/18  Educator AS  Instruction Review Code 1- Verbalizes Understanding       Activity Barriers & Risk Stratification:  Activity Barriers & Cardiac Risk Stratification - 06/12/23 1637       Activity Barriers & Cardiac Risk Stratification   Activity Barriers Muscular Weakness;Balance Concerns;Other (comment);Deconditioning    Comments Healing from broken left collarbone             6 Minute Walk:  6 Minute Walk     Row Name 06/12/23 1634         6 Minute Walk   Phase Initial     Distance 600 feet     Walk Time 4.77 minutes     # of Rest Breaks 1     MPH 1.43     METS 2.19     RPE 18     Perceived Dyspnea  3     VO2 Peak 7.69     Symptoms Yes (comment)     Comments Lightheadedness at the end of test     Resting HR 117 bpm     Resting BP 150/90     Resting Oxygen Saturation  90 %     Exercise Oxygen Saturation  during 6 min walk 85 %     Max Ex. HR 134 bpm     Max Ex. BP 150/80     2 Minute Post BP 148/80       Interval HR   1 Minute HR 118     2 Minute HR 127     3  Minute HR 128     4 Minute HR 124     5 Minute HR 88     6 Minute HR 134     2 Minute Post HR 116     Interval Heart Rate? Yes       Interval Oxygen   Interval Oxygen? Yes     Baseline Oxygen Saturation % 90 %     1 Minute Oxygen Saturation % 87 %     1 Minute Liters of Oxygen 2 L     2 Minute Oxygen Saturation % 86 %     2 Minute Liters of Oxygen 2 L     3 Minute Oxygen Saturation % 85 %     3 Minute Liters of  Oxygen 2 L     4 Minute Oxygen Saturation % 87 %     4 Minute Liters of Oxygen 2 L     5 Minute Oxygen Saturation % 86 %     5 Minute Liters of Oxygen 2 L     6 Minute Oxygen Saturation % 85 %     6 Minute Liters of Oxygen 2 L     2 Minute Post Oxygen Saturation % 93 %     2 Minute Post Liters of Oxygen 2 L             Oxygen Initial Assessment:  Oxygen Initial Assessment - 04/26/23 1002       Home Oxygen   Home Oxygen Device Portable Concentrator;Home Concentrator    Sleep Oxygen Prescription CPAP    Liters per minute 2    Home Exercise Oxygen Prescription Continuous    Liters per minute 2    Home Resting Oxygen Prescription Continuous    Liters per minute 2    Compliance with Home Oxygen Use Yes      Intervention   Short Term Goals To learn and exhibit compliance with exercise, home and travel O2 prescription;To learn and understand importance of monitoring SPO2 with pulse oximeter and demonstrate accurate use of the pulse oximeter.;To learn and understand importance of maintaining oxygen saturations>88%;To learn and demonstrate proper pursed lip breathing techniques or other breathing techniques. ;To learn and demonstrate proper use of respiratory medications    Long  Term Goals Exhibits compliance with exercise, home  and travel O2 prescription;Verbalizes importance of monitoring SPO2 with pulse oximeter and return demonstration;Maintenance of O2 saturations>88%;Exhibits proper breathing techniques, such as pursed lip breathing or other method taught during program session;Compliance with respiratory medication;Demonstrates proper use of MDI's             Oxygen Re-Evaluation:  Oxygen Re-Evaluation     Row Name 06/13/23 1015 07/13/23 1116 08/31/23 1104         Program Oxygen Prescription   Program Oxygen Prescription Continuous Continuous;E-Tanks Continuous;E-Tanks     Liters per minute 2 4 4        Home Oxygen   Home Oxygen Device Portable Concentrator;Home  Concentrator Portable Concentrator;Home Concentrator --     Sleep Oxygen Prescription CPAP CPAP CPAP     Liters per minute 2 2 2      Home Exercise Oxygen Prescription Continuous Continuous Continuous     Liters per minute 2 4 4      Home Resting Oxygen Prescription Continuous Continuous Continuous     Liters per minute 2 2 2      Compliance with Home Oxygen Use Yes Yes Yes       Goals/Expected Outcomes  Short Term Goals To learn and demonstrate proper pursed lip breathing techniques or other breathing techniques.  To learn and demonstrate proper pursed lip breathing techniques or other breathing techniques.  To learn and understand importance of maintaining oxygen saturations>88%     Long  Term Goals Exhibits proper breathing techniques, such as pursed lip breathing or other method taught during program session Exhibits proper breathing techniques, such as pursed lip breathing or other method taught during program session Maintenance of O2 saturations>88%     Comments Reviewed PLB technique with pt.  Talked about how it works and it's importance in maintaining their exercise saturations. Informed patient how to perform the Pursed Lipped breathing technique. Told patient to Inhale through the nose and out the mouth with pursed lips to keep their airways open, help oxygenate them better, practice when at rest or doing strenuous activity. Patient Verbalizes understanding of technique and will work on and be reiterated during [name]. Stacie [name] has a pulse oximeter to check her oxygen saturation at home. Informed and explained why it is important to have one. Reviewed that oxygen saturations should be 88 percent and above.     Goals/Expected Outcomes Short: Become more profiecient at using PLB. Long: Become independent at using PLB. Short: use PLB with exertion. Long: use PLB on exertion proficiently and independently. Short: monitor oxygen at home with exertion. Long: maintain oxygen saturations above 88  percent independently.              Oxygen Discharge (Final Oxygen Re-Evaluation):  Oxygen Re-Evaluation - 08/31/23 1104       Program Oxygen Prescription   Program Oxygen Prescription Continuous;E-Tanks    Liters per minute 4      Home Oxygen   Sleep Oxygen Prescription CPAP    Liters per minute 2    Home Exercise Oxygen Prescription Continuous    Liters per minute 4    Home Resting Oxygen Prescription Continuous    Liters per minute 2    Compliance with Home Oxygen Use Yes      Goals/Expected Outcomes   Short Term Goals To learn and understand importance of maintaining oxygen saturations>88%    Long  Term Goals Maintenance of O2 saturations>88%    Comments Stacie [name] has a pulse oximeter to check her oxygen saturation at home. Informed and explained why it is important to have one. Reviewed that oxygen saturations should be 88 percent and above.    Goals/Expected Outcomes Short: monitor oxygen at home with exertion. Long: maintain oxygen saturations above 88 percent independently.             Initial Exercise Prescription:  Initial Exercise Prescription - 08/23/23 1500       Biostep-RELP   METs 2             Perform Capillary Blood Glucose checks as needed.  Exercise Prescription Changes:   Exercise Prescription Changes     Row Name 06/12/23 1600 06/29/23 1300 07/11/23 1400 07/26/23 1000 08/10/23 0700     Response to Exercise   Blood Pressure (Admit) 150/90 114/62 128/70 126/70 132/64   Blood Pressure (Exercise) 150/80 130/64 -- 120/70 134/70   Blood Pressure (Exit) 112/80 120/70 114/68 118/60 124/68   Heart Rate (Admit) 117 bpm 103 bpm 103 bpm 114 bpm 105 bpm   Heart Rate (Exercise) 134 bpm 124 bpm 119 bpm 115 bpm 115 bpm   Heart Rate (Exit) 117 bpm 97 bpm 102 bpm 78 bpm 104 bpm   Oxygen Saturation (Admit)  90 % 92 % 94 % 92 % 93 %   Oxygen Saturation (Exercise) 85 % 87 % 88 % 92 % 88 %   Oxygen Saturation (Exit) 91 % 94 % 96 % 93 % 96 %   Rating of  Perceived Exertion (Exercise) 18 17 14 13 13    Perceived Dyspnea (Exercise) 3 4 3  0 3   Symptoms Lightheadedness at end of none none none none   Comments results First two days of exercise -- -- --   Duration -- Progress to 30 minutes of  aerobic without signs/symptoms of physical distress Progress to 30 minutes of  aerobic without signs/symptoms of physical distress Progress to 30 minutes of  aerobic without signs/symptoms of physical distress Progress to 30 minutes of  aerobic without signs/symptoms of physical distress   Intensity -- THRR unchanged THRR unchanged THRR unchanged THRR unchanged     Progression   Progression -- Continue to progress workloads to maintain intensity without signs/symptoms of physical distress. Continue to progress workloads to maintain intensity without signs/symptoms of physical distress. Continue to progress workloads to maintain intensity without signs/symptoms of physical distress. Continue to progress workloads to maintain intensity without signs/symptoms of physical distress.   Average METs -- 1.75 2.04 2.7 2.94     Resistance Training   Training Prescription -- Yes Yes Yes Yes   Weight -- 2lb 2 lb 2 lb 2 lb   Reps -- 10-15 10-15 10-15 10-15     Interval Training   Interval Training -- No No No No     Oxygen   Oxygen -- Continuous Continuous Continuous Continuous   Liters -- 3-4 4 4 4      Treadmill   MPH -- 1.2 1 -- --   Grade -- 0 0 -- --   Minutes -- 10 15 -- --   METs -- 1.92 1.77 -- --     Recumbant Bike   Level -- -- 1 1 1    Watts -- -- 25 25 25    Minutes -- -- 15 15 15    METs -- -- 2.92 2.93 2.94     NuStep   Level -- 2  T6 3 2 2    Minutes -- 15 15 15 15    METs -- -- 2 2.4 --     REL-XR   Level -- -- -- -- 1   Minutes -- -- -- -- 15     T5 Nustep   Level -- -- 1 -- 1   Minutes -- -- 15 -- 15   METs -- -- 1.7 -- --     Oxygen   Maintain Oxygen Saturation -- 88% or higher 88% or higher 88% or higher --    Row Name  08/23/23 1500             Response to Exercise   Blood Pressure (Admit) 124/62       Blood Pressure (Exercise) 140/60       Blood Pressure (Exit) 138/72       Heart Rate (Admit) 109 bpm       Heart Rate (Exercise) 115 bpm       Heart Rate (Exit) 121 bpm       Oxygen Saturation (Admit) 88 %       Oxygen Saturation (Exercise) 92 %       Oxygen Saturation (Exit) 85 %       Rating of Perceived Exertion (Exercise) 11       Perceived  Dyspnea (Exercise) 2       Symptoms none       Duration Progress to 30 minutes of  aerobic without signs/symptoms of physical distress       Intensity THRR unchanged         Progression   Progression Continue to progress workloads to maintain intensity without signs/symptoms of physical distress.       Average METs 1.95         Resistance Training   Training Prescription Yes       Weight 2 lb       Reps 10-15         Interval Training   Interval Training No         Oxygen   Oxygen Continuous       Liters 4         NuStep   Level 1       Minutes 15       METs 1.9         Biostep-RELP   Level 1       Minutes 15         Oxygen   Maintain Oxygen Saturation 88% or higher                Exercise Comments:   Exercise Comments     Row Name 06/13/23 1015           Exercise Comments First full day of exercise!  Patient was oriented to gym and equipment including functions, settings, policies, and procedures.  Patient's individual exercise prescription and treatment plan were reviewed.  All starting workloads were established based on the results of the 6 minute walk test done at initial orientation visit.  The plan for exercise progression was also introduced and progression will be customized based on patient's performance and goals.                Exercise Goals and Review:   Exercise Goals     Row Name 06/12/23 1643             Exercise Goals   Increase Physical Activity Yes       Intervention Provide advice,  education, support and counseling about physical activity/exercise needs.;Develop an individualized exercise prescription for aerobic and resistive training based on initial evaluation findings, risk stratification, comorbidities and participant's personal goals.       Expected Outcomes Short Term: Attend rehab on a regular basis to increase amount of physical activity.;Long Term: Exercising regularly at least 3-5 days a week.;Long Term: Add in home exercise to make exercise part of routine and to increase amount of physical activity.       Increase Strength and Stamina Yes       Intervention Provide advice, education, support and counseling about physical activity/exercise needs.;Develop an individualized exercise prescription for aerobic and resistive training based on initial evaluation findings, risk stratification, comorbidities and participant's personal goals.       Expected Outcomes Short Term: Increase workloads from initial exercise prescription for resistance, speed, and METs.;Short Term: Perform resistance training exercises routinely during rehab and add in resistance training at home;Long Term: Improve cardiorespiratory fitness, muscular endurance and strength as measured by increased METs and functional capacity ( )       Able to understand and use rate of perceived exertion (RPE) scale Yes       Intervention Provide education and explanation on how to use RPE scale  Expected Outcomes Short Term: Able to use RPE daily in rehab to express subjective intensity level;Long Term:  Able to use RPE to guide intensity level when exercising independently       Able to understand and use Dyspnea scale Yes       Intervention Provide education and explanation on how to use Dyspnea scale       Expected Outcomes Short Term: Able to use Dyspnea scale daily in rehab to express subjective sense of shortness of breath during exertion;Long Term: Able to use Dyspnea scale to guide intensity level when  exercising independently       Knowledge and understanding of Target Heart Rate Range (THRR) Yes       Intervention Provide education and explanation of THRR including how the numbers were predicted and where they are located for reference       Expected Outcomes Short Term: Able to state/look up THRR;Short Term: Able to use daily as guideline for intensity in rehab;Long Term: Able to use THRR to govern intensity when exercising independently       Able to check pulse independently Yes       Intervention Provide education and demonstration on how to check pulse in carotid and radial arteries.;Review the importance of being able to check your own pulse for safety during independent exercise       Expected Outcomes Short Term: Able to explain why pulse checking is important during independent exercise;Long Term: Able to check pulse independently and accurately       Understanding of Exercise Prescription Yes       Intervention Provide education, explanation, and written materials on patient's individual exercise prescription       Expected Outcomes Short Term: Able to explain program exercise prescription;Long Term: Able to explain home exercise prescription to exercise independently                Exercise Goals Re-Evaluation :  Exercise Goals Re-Evaluation     Row Name 06/13/23 1016 06/29/23 1345 07/11/23 1423 07/26/23 1046 08/10/23 0709     Exercise Goal Re-Evaluation   Exercise Goals Review Understanding of Exercise Prescription;Knowledge and understanding of Target Heart Rate Range (THRR);Able to understand and use Dyspnea scale;Able to understand and use rate of perceived exertion (RPE) scale Increase Physical Activity;Increase Strength and Stamina;Understanding of Exercise Prescription Increase Physical Activity;Increase Strength and Stamina;Understanding of Exercise Prescription Increase Physical Activity;Increase Strength and Stamina;Understanding of Exercise Prescription Increase  Physical Activity;Increase Strength and Stamina;Understanding of Exercise Prescription   Comments Reviewed RPE  and dyspnea scale, THR and program prescription with pt today.  Pt voiced understanding and was given a copy of goals to take home.       Short: Use RPE daily to regulate intensity.  Long: Follow program prescription in THR. Stacie [name] is off to a good start in the program. She is doing well on the treadmill as she was able to walk at a speed of 1.2 mph for 10 minutes with no incline. She also improved to level 2 on the T6 nustep during her second session. We will continue to monitor her progress in the program. Stacie [name] is doing well in rehab. She improved to level 3 on the T4 nustep and worked at level 1 on the recumbent bike and T5 nustep. She did see a decrease in her speed on the treadmill from 1.2 mph to 1.0 mph. We will continue to monitor her progress in the program. Stacie [name] is doing well in rehab. She only  attended one session during this review. In that one session she was able to maintain her intensity of level 1 on the recumbent bike. She also decreased her level on the T4 nustep from 3 back down to 2. We will continue to monitor her progress in the program. Stacie [name] continues to do well in rehab. She only attended two sessions during the time of this review. In those two sessions she was able to add the XR, at level 1, to her current exercise prescription. We will continue to monitor her progress in the program.   Expected Outcomes Short: Use RPE daily to regulate intensity. Long: Follow program prescription in THR. Short: Continue to follow current exercise prescription. Long: Continue exercise to improve strength and stamina. Short: Increase treadmill speed back up to 1.2 mph. Long: Continue exercise to improve strength and stamina. Short: Increase level on the T4 nustep back to 3, try level 2 on the recumbent bike. Long: Continue exercise to improve strength and stamina. Short: Increase level on the  T4 nustep back to 3, try level 2 on the recumbent bike. Long: Continue exercise to improve strength and stamina.    Row Name 08/23/23 1532             Exercise Goal Re-Evaluation   Exercise Goals Review Increase Physical Activity;Increase Strength and Stamina;Understanding of Exercise Prescription       Comments Stacie [name] continues to do well in rehab. She has only attended one session during this review. She was able to use the biostep and T4 nustep at level 1. We will continue to monitor her progress in the program.       Expected Outcomes Short: Increase level on the T4 nustep back to 3, try level 2 on the recumbent bike. Long: Continue exercise to improve strength and stamina.                Discharge Exercise Prescription (Final Exercise Prescription Changes):  Exercise Prescription Changes - 08/23/23 1500       Response to Exercise   Blood Pressure (Admit) 124/62    Blood Pressure (Exercise) 140/60    Blood Pressure (Exit) 138/72    Heart Rate (Admit) 109 bpm    Heart Rate (Exercise) 115 bpm    Heart Rate (Exit) 121 bpm    Oxygen Saturation (Admit) 88 %    Oxygen Saturation (Exercise) 92 %    Oxygen Saturation (Exit) 85 %    Rating of Perceived Exertion (Exercise) 11    Perceived Dyspnea (Exercise) 2    Symptoms none    Duration Progress to 30 minutes of  aerobic without signs/symptoms of physical distress    Intensity THRR unchanged      Progression   Progression Continue to progress workloads to maintain intensity without signs/symptoms of physical distress.    Average METs 1.95      Resistance Training   Training Prescription Yes    Weight 2 lb    Reps 10-15      Interval Training   Interval Training No      Oxygen   Oxygen Continuous    Liters 4      NuStep   Level 1    Minutes 15    METs 1.9      Biostep-RELP   Level 1    Minutes 15      Oxygen   Maintain Oxygen Saturation 88% or higher             Nutrition:  Target Goals:  Understanding of nutrition guidelines, daily intake of sodium 1500mg , cholesterol 200mg , calories 30% from fat and 7% or less from saturated fats, daily to have 5 or more servings of fruits and vegetables.  Education: All About Nutrition: -Group instruction provided by verbal, written material, interactive activities, discussions, models, and posters to present general guidelines for heart healthy nutrition including fat, fiber, MyPlate, the role of sodium in heart healthy nutrition, utilization of the nutrition label, and utilization of this knowledge for meal planning. Follow up email sent as well. Written material given at graduation. Flowsheet Row Pulmonary Rehab from 08/10/2018 in [facility] Cardiac and Pulmonary Rehab  Date 06/27/18  Educator LB  Instruction Review Code 1- Verbalizes Understanding       Biometrics:  Pre Biometrics - 06/12/23 1644       Pre Biometrics   Height 5\' 7"  (1.702 m)    Weight 187 lb 11.2 oz (85.1 kg)    Waist Circumference 44.5 inches    Hip Circumference 49 inches    Waist to Hip Ratio 0.91 %    BMI (Calculated) 29.39    Single Leg Stand 3.6 seconds              Nutrition Therapy Plan and Nutrition Goals:   Nutrition Assessments:  MEDIFICTS Score Key: >=70 Need to make dietary changes  40-70 Heart Healthy Diet <= 40 Therapeutic Level Cholesterol Diet  Flowsheet Row Pulmonary Rehab from 06/12/2023 in [facility] Cardiac and Pulmonary Rehab  Picture Your Plate Total Score on Admission 64      Picture Your Plate Scores: <13 Unhealthy dietary pattern with much room for improvement. 41-50 Dietary pattern unlikely to meet recommendations for good health and room for improvement. 51-60 More healthful dietary pattern, with some room for improvement.  >60 Healthy dietary pattern, although there may be some specific behaviors that could be improved.   Nutrition Goals Re-Evaluation:  Nutrition Goals Re-Evaluation     Row Name 07/13/23 1121 08/31/23  1106           Goals   Current Weight 185 lb (83.9 kg) 187 lb (84.8 kg)      Nutrition Goal -- Eat more nutrient rich foods      Comment Patient was informed on why it is important to maintain a balanced diet when dealing with Respiratory issues. Explained that it takes a lot of energy to breath and when they are short of breath often they will need to have a good diet to help keep up with the calories they are expending for breathing. Stacie [name] wants to eat a "cleaner" diet. Her daughter is working with her to try to eliminate carbs  more and make healthier choices.      Expected Outcome Short: Choose and plan snacks accordingly to patients caloric intake to improve breathing. Long: Maintain a diet independently that meets their caloric intake to aid in daily shortness of breath. Short: eat less carbs. Long: adhere to a diet that pertains to her.               Nutrition Goals Discharge (Final Nutrition Goals Re-Evaluation):  Nutrition Goals Re-Evaluation - 08/31/23 1106       Goals   Current Weight 187 lb (84.8 kg)    Nutrition Goal Eat more nutrient rich foods    Comment Stacie [name] wants to eat a "cleaner" diet. Her daughter is working with her to try to eliminate carbs  more and make healthier choices.    Expected Outcome  Short: eat less carbs. Long: adhere to a diet that pertains to her.             Psychosocial: Target Goals: Acknowledge presence or absence of significant depression and/or stress, maximize coping skills, provide positive support system. Participant is able to verbalize types and ability to use techniques and skills needed for reducing stress and depression.   Education: Stress, Anxiety, and Depression - Group verbal and visual presentation to define topics covered.  Reviews how body is impacted by stress, anxiety, and depression.  Also discusses healthy ways to reduce stress and to treat/manage anxiety and depression.  Written material given at graduation. Flowsheet  Row Pulmonary Rehab from 08/10/2022 in [facility] Cardiac and Pulmonary Rehab  Date 08/10/22  Educator [facility]  Instruction Review Code 1- [employer] Understanding       Education: Sleep Hygiene -Provides group verbal and written instruction about how sleep can affect your health.  Define sleep hygiene, discuss sleep cycles and impact of sleep habits. Review good sleep hygiene tips.  Flowsheet Row Pulmonary Rehab from 08/10/2018 in [facility] Cardiac and Pulmonary Rehab  Date 04/11/18  Educator [facility]  Instruction Review Code 1- Verbalizes Understanding       Initial Review & Psychosocial Screening:  Initial Psych Review & Screening - 04/26/23 1017       Initial Review   Current issues with None Identified      Family Dynamics   Good Support System? Yes   husband, daughters, family     Barriers   Psychosocial barriers to participate in program There are no identifiable barriers or psychosocial needs.;The patient should benefit from training in stress management and relaxation.      Screening Interventions   Interventions Encouraged to exercise;Provide feedback about the scores to participant    Expected Outcomes Short Term goal: Utilizing psychosocial counselor, staff and physician to assist with identification of specific Stressors or current issues interfering with healing process. Setting desired goal for each stressor or current issue identified.;Long Term Goal: Stressors or current issues are controlled or eliminated.;Short Term goal: Identification and review with participant of any Quality of Life or Depression concerns found by scoring the questionnaire.;Long Term goal: The participant improves quality of Life and PHQ9 Scores as seen by post scores and/or verbalization of changes             Quality of Life Scores:  Scores of 19 and below usually indicate a poorer quality of life in these areas.  A difference of  2-3 points is a clinically meaningful difference.  A difference of 2-3  points in the total score of the Quality of Life Index has been associated with significant improvement in overall quality of life, self-image, physical symptoms, and general health in studies assessing change in quality of life.  PHQ-9: Review Flowsheet  More data exists      06/12/2023 07/20/2022 06/24/2022 10/13/2021 09/28/2021  Depression screen PHQ 2/9  Decreased Interest 1 0 0 0 0  Down, Depressed, Hopeless 0 0 0 0 0  PHQ - 2 Score 1 0 0 0 0  Altered sleeping 0 1 0 0 0  Tired, decreased energy 2 1 0 0 0  Change in appetite 0 0 0 0 0  Feeling bad or failure about yourself  0 0 0 0 0  Trouble concentrating 0 1 0 0 0  Moving slowly or fidgety/restless 0 0 0 0 0  Suicidal thoughts 0 0 0 0 0  PHQ-9 Score 3 3 0  0 0  Difficult doing work/chores Somewhat difficult Not difficult at all Not difficult at all Not difficult at all Not difficult at all    Details           Interpretation of Total Score  Total Score Depression Severity:  1-4 = Minimal depression, 5-9 = Mild depression, 10-14 = Moderate depression, 15-19 = Moderately severe depression, 20-27 = Severe depression   Psychosocial Evaluation and Intervention:  Psychosocial Evaluation - 04/26/23 1018       Psychosocial Evaluation & Interventions   Interventions Encouraged to exercise with the program and follow exercise prescription    Comments Stacie [name] is returning to pulmonary rehab after having to discharge early in January due to health concerns. Since then, her cardiac work up has been completed with no concerns. She has noticed her breathing has gotten worse because she has not been as active. She also notes that her legs are weak and her stamina has decreased. She recently fell and doesn't really know if it was related to blacking out with bending over movement or something else. She sees her doctor this Friday. She is motivated to return and wants to be able to keep up with her family. When asked about stress, she states she  has a great outlook on life thanks to her family and everyone is doing well.    Expected Outcomes Short: attend pulmonary rehab for education and exercise. Long: develop and maintain positive self care habits.    Continue Psychosocial Services  Follow up required by staff             Psychosocial Re-Evaluation:  Psychosocial Re-Evaluation     Row Name 07/13/23 1119 08/31/23 1110           Psychosocial Re-Evaluation   Current issues with Current Psychotropic Meds;Current Stress Concerns Current Stress Concerns      Comments Her depression and stress concerns stem from her health. She thinks she is doing better with her mental health with all her life changes. Her daughter helps her alot and is trying to give her daughter time to have a life for herself. Patient reports no issues with their current mental states, sleep, stress, depression or anxiety. Will follow up with patient in a few weeks for any changes.      Expected Outcomes Short; Conitnue to exercise for menatl boost Long: Continue to stay positive Short: Continue to exercise regularly to support mental health and notify staff of any changes. Long: maintain mental health and well being through teaching of rehab or prescribed medications independently.      Interventions Encouraged to attend Pulmonary Rehabilitation for the exercise Encouraged to attend Pulmonary Rehabilitation for the exercise      Continue Psychosocial Services  Follow up required by staff Follow up required by staff               Psychosocial Discharge (Final Psychosocial Re-Evaluation):  Psychosocial Re-Evaluation - 08/31/23 1110       Psychosocial Re-Evaluation   Current issues with Current Stress Concerns    Comments Patient reports no issues with their current mental states, sleep, stress, depression or anxiety. Will follow up with patient in a few weeks for any changes.    Expected Outcomes Short: Continue to exercise regularly to support mental  health and notify staff of any changes. Long: maintain mental health and well being through teaching of rehab or prescribed medications independently.    Interventions Encouraged to attend Pulmonary Rehabilitation for the exercise  Continue Psychosocial Services  Follow up required by staff             Education: Education Goals: Education classes will be provided on a weekly basis, covering required topics. Participant will state understanding/return demonstration of topics presented.  Learning Barriers/Preferences:  Learning Barriers/Preferences - 04/26/23 1008       Learning Barriers/Preferences   Learning Barriers None    Learning Preferences None             General Pulmonary Education Topics:  Infection Prevention: - Provides verbal and written material to individual with discussion of infection control including proper hand washing and proper equipment cleaning during exercise session. Flowsheet Row Pulmonary Rehab from 06/12/2023 in [facility] Cardiac and Pulmonary Rehab  Date 06/12/23  Educator MB  Instruction Review Code 1- Verbalizes Understanding       Falls Prevention: - Provides verbal and written material to individual with discussion of falls prevention and safety. Flowsheet Row Pulmonary Rehab from 06/12/2023 in [facility] Cardiac and Pulmonary Rehab  Date 06/12/23  Educator MB  Instruction Review Code 1- Verbalizes Understanding       Chronic Lung Disease Review: - Group verbal instruction with posters, models, PowerPoint presentations and videos,  to review new updates, new respiratory medications, new advancements in procedures and treatments. Providing information on websites and "800" numbers for continued self-education. Includes information about supplement oxygen, available portable oxygen systems, continuous and intermittent flow rates, oxygen safety, concentrators, and Medicare reimbursement for oxygen. Explanation of Pulmonary Drugs, including class,  frequency, complications, importance of spacers, rinsing mouth after steroid MDI's, and proper cleaning methods for nebulizers. Review of basic lung anatomy and physiology related to function, structure, and complications of lung disease. Review of risk factors. Discussion about methods for diagnosing sleep apnea and types of masks and machines for OSA. Includes a review of the use of types of environmental controls: home humidity, furnaces, filters, dust mite/pet prevention, HEPA vacuums. Discussion about weather changes, air quality and the benefits of nasal washing. Instruction on Warning signs, infection symptoms, calling MD promptly, preventive modes, and value of vaccinations. Review of effective airway clearance, coughing and/or vibration techniques. Emphasizing that all should Create an Action Plan. Written material given at graduation. Flowsheet Row Pulmonary Rehab from 08/10/2018 in [facility] Cardiac and Pulmonary Rehab  Date 06/29/18  Educator [facility]  Instruction Review Code 1- Verbalizes Understanding       AED/CPR: - Group verbal and written instruction with the use of models to demonstrate the basic use of the AED with the basic ABC's of resuscitation.    Anatomy and Cardiac Procedures: - Group verbal and visual presentation and models provide information about basic cardiac anatomy and function. Reviews the testing methods done to diagnose heart disease and the outcomes of the test results. Describes the treatment choices: Medical Management, Angioplasty, or Coronary Bypass Surgery for treating various heart conditions including Myocardial Infarction, Angina, Valve Disease, and Cardiac Arrhythmias.  Written material given at graduation. Flowsheet Row Pulmonary Rehab from 08/10/2018 in [facility] Cardiac and Pulmonary Rehab  Date 05/16/18  Educator [facility]  Instruction Review Code 5- Refused Teaching       Medication Safety: - Group verbal and visual instruction to review commonly prescribed  medications for heart and lung disease. Reviews the medication, class of the drug, and side effects. Includes the steps to properly store meds and maintain the prescription regimen.  Written material given at graduation. Flowsheet Row Pulmonary Rehab from 08/10/2018 in [facility] Cardiac and Pulmonary Rehab  Date 08/10/18  Educator [facility]  Instruction Review Code 1- Verbalizes Understanding       Other: -Provides group and verbal instruction on various topics (see comments)   Knowledge Questionnaire Score:  Knowledge Questionnaire Score - 06/12/23 1624       Knowledge Questionnaire Score   Pre Score 18/18              Core Components/Risk Factors/Patient Goals at Admission:  Personal Goals and Risk Factors at Admission - 06/12/23 1646       Core Components/Risk Factors/Patient Goals on Admission    Weight Management Yes;Weight Maintenance;Weight Loss    Intervention Weight Management: Develop a combined nutrition and exercise program designed to reach desired caloric intake, while maintaining appropriate intake of nutrient and fiber, sodium and fats, and appropriate energy expenditure required for the weight goal.;Weight Management: Provide education and appropriate resources to help participant work on and attain dietary goals.;Weight Management/Obesity: Establish reasonable short term and long term weight goals.    Admit Weight 187 lb 11.2 oz (85.1 kg)    Goal Weight: Short Term 172 lb (78 kg)    Goal Weight: Long Term 150 lb (68 kg)    Expected Outcomes Short Term: Continue to assess and modify interventions until short term weight is achieved;Long Term: Adherence to nutrition and physical activity/exercise program aimed toward attainment of established weight goal;Weight Loss: Understanding of general recommendations for a balanced deficit meal plan, which promotes 1-2 lb weight loss per week and includes a negative energy balance of [id number] kcal/d;Understanding recommendations for  meals to include 15-35% energy as protein, 25-35% energy from fat, 35-60% energy from carbohydrates, less than 200mg  of dietary cholesterol, 20-35 gm of total fiber daily;Understanding of distribution of calorie intake throughout the day with the consumption of 4-5 meals/snacks    Improve shortness of breath with ADL's Yes    Intervention Provide education, individualized exercise plan and daily activity instruction to help decrease symptoms of SOB with activities of daily living.    Expected Outcomes Short Term: Improve cardiorespiratory fitness to achieve a reduction of symptoms when performing ADLs;Long Term: Be able to perform more ADLs without symptoms or delay the onset of symptoms    Hypertension Yes    Intervention Provide education on lifestyle modifcations including regular physical activity/exercise, weight management, moderate sodium restriction and increased consumption of fresh fruit, vegetables, and low fat dairy, alcohol moderation, and smoking cessation.;Monitor prescription use compliance.    Expected Outcomes Short Term: Continued assessment and intervention until BP is < 140/81mm HG in hypertensive participants. < 130/56mm HG in hypertensive participants with diabetes, heart failure or chronic kidney disease.;Long Term: Maintenance of blood pressure at goal levels.    Lipids Yes    Intervention Provide education and support for participant on nutrition & aerobic/resistive exercise along with prescribed medications to achieve LDL 70mg , HDL >40mg .    Expected Outcomes Short Term: Participant states understanding of desired cholesterol values and is compliant with medications prescribed. Participant is following exercise prescription and nutrition guidelines.;Long Term: Cholesterol controlled with medications as prescribed, with individualized exercise RX and with personalized nutrition plan. Value goals: LDL < 70mg , HDL > 40 mg.             Education:Diabetes - Individual verbal  and written instruction to review signs/symptoms of diabetes, desired ranges of glucose level fasting, after meals and with exercise. Acknowledge that pre and post exercise glucose checks will be done for 3 sessions at entry of program.   Know  Your Numbers and Heart Failure: - Group verbal and visual instruction to discuss disease risk factors for cardiac and pulmonary disease and treatment options.  Reviews associated critical values for Overweight/Obesity, Hypertension, Cholesterol, and Diabetes.  Discusses basics of heart failure: signs/symptoms and treatments.  Introduces Heart Failure Zone chart for action plan for heart failure.  Written material given at graduation. Flowsheet Row Pulmonary Rehab from 08/10/2022 in [facility] Cardiac and Pulmonary Rehab  Date 07/27/22  Educator SB  Instruction Review Code 1- Verbalizes Understanding       Core Components/Risk Factors/Patient Goals Review:   Goals and Risk Factor Review     Row Name 07/13/23 1118 08/31/23 1109           Core Components/Risk Factors/Patient Goals Review   Personal Goals Review Improve shortness of breath with ADL's Develop more efficient breathing techniques such as purse lipped breathing and diaphragmatic breathing and practicing self-pacing with activity.      Review Spoke to patient about their shortness of breath and what they can do to improve. Patient has been informed of breathing techniques when starting the program. Patient is informed to tell staff if they have had any med changes and that certain meds they are taking or not taking can be causing shortness of breath. Diaphragmatic and PLB breathing explained and performed with patient. Patient has a better understanding of how to do these exercises to help with breathing performance and relaxation. Patient performed breathing techniques adequately and to practice further at home.      Expected Outcomes Short: Attend [name] regularly to improve shortness of breath  with ADL's. Long: maintain independence with ADL's Short: practice PLB and diaphragmatic breathing at home. Long: Use PLB and diaphragmatic breathing independently post [name].               Core Components/Risk Factors/Patient Goals at Discharge (Final Review):   Goals and Risk Factor Review - 08/31/23 1109       Core Components/Risk Factors/Patient Goals Review   Personal Goals Review Develop more efficient breathing techniques such as purse lipped breathing and diaphragmatic breathing and practicing self-pacing with activity.    Review Diaphragmatic and PLB breathing explained and performed with patient. Patient has a better understanding of how to do these exercises to help with breathing performance and relaxation. Patient performed breathing techniques adequately and to practice further at home.    Expected Outcomes Short: practice PLB and diaphragmatic breathing at home. Long: Use PLB and diaphragmatic breathing independently post [name].             ITP Comments:  ITP Comments     Row Name 04/26/23 1016 06/12/23 1633 06/13/23 1015 06/21/23 1042 07/19/23 1218   ITP Comments Initial phone call completed. Diagnosis can be found in [facility] 5/7. EP Orientation scheduled for Wednesday 7/17 at 9:30. Completed and gym orientation. Initial ITP created and sent for review to Vida Rigger, Medical Director. First full day of exercise!  Patient was oriented to gym and equipment including functions, settings, policies, and procedures.  Patient's individual exercise prescription and treatment plan were reviewed.  All starting workloads were established based on the results of the 6 minute walk test done at initial orientation visit.  The plan for exercise progression was also introduced and progression will be customized based on patient's performance and goals. 30 Day review completed. Medical Director ITP review done, changes made as directed, and signed approval by Medical Director.      new to program 30  Day review completed. Medical Director ITP review done, changes made as directed, and signed approval by Medical Director.    Row Name 08/16/23 1308 09/06/23 1157         ITP Comments 30 Day review completed. Medical Director ITP review done, changes made as directed, and signed approval by Medical Director. 30 Day review completed. Medical Director ITP review done, changes made as directed, and signed approval by Medical Director.               Comments:

## 2023-09-07 ENCOUNTER — Other Ambulatory Visit: Payer: Self-pay | Admitting: Internal Medicine

## 2023-09-07 ENCOUNTER — Encounter: Payer: BC Managed Care – PPO | Admitting: *Deleted

## 2023-09-07 DIAGNOSIS — E039 Hypothyroidism, unspecified: Secondary | ICD-10-CM

## 2023-09-08 ENCOUNTER — Other Ambulatory Visit: Payer: Self-pay | Admitting: Internal Medicine

## 2023-09-08 ENCOUNTER — Other Ambulatory Visit: Payer: Self-pay

## 2023-09-08 DIAGNOSIS — E039 Hypothyroidism, unspecified: Secondary | ICD-10-CM

## 2023-09-08 MED ORDER — LEVOTHYROXINE SODIUM 137 MCG PO TABS: 137.0000 ug | ORAL_TABLET | Freq: Every day | ORAL | 3 refills | Status: AC

## 2023-09-08 MED ORDER — LEVOTHYROXINE SODIUM 137 MCG PO TABS: 137.0000 ug | ORAL_TABLET | Freq: Every day | ORAL | 0 refills | Status: DC

## 2023-09-08 MED ORDER — LEVOTHYROXINE SODIUM 137 MCG PO TABS
137.0000 ug | ORAL_TABLET | Freq: Every day | ORAL | 3 refills | Status: DC
Start: 2023-09-08 — End: 2023-09-08

## 2023-09-08 NOTE — Telephone Encounter (Signed)
Requested medication (s) are due for refill today: Yes  Requested medication (s) are on the active medication list: Yes  Last refill:  06/25/23  Future visit scheduled: Yes  Notes to clinic:  Unable to refill per protocol, appointment needed.    Requested Prescriptions  Pending Prescriptions Disp Refills   levothyroxine (SYNTHROID) 137 MCG tablet [Pharmacy Med Name: LEVOTHYROXINE 137 MCG TABLET] 90 tablet 3    Sig: TAKE 1 TABLET BY MOUTH DAILY BEFORE BREAKFAST.     Endocrinology:  Hypothyroid Agents Failed - 09/07/2023  2:31 AM      Failed - TSH in normal range and within 360 days    TSH  Date Value Ref Range Status  06/24/2022 0.127 (L) 0.450 - 4.500 uIU/mL Final         Failed - Valid encounter within last 12 months    Recent Outpatient Visits           1 year ago Hypothyroidism, acquired   [facility] at [facility], Nyoka Cowden, MD   1 year ago Bronchitis due to COVID-19 virus   [facility] at [facility], Nyoka Cowden, MD   1 year ago COVID-19 virus infection   [place] [facility] at [facility], Nyoka Cowden, MD   2 years ago Cerebrovascular accident (CVA) due to embolism of cerebral artery [facility])   [place] [facility] at [facility], Nyoka Cowden, MD   3 years ago Acute cystitis without hematuria   [facility] at [facility], Nyoka Cowden, MD

## 2023-09-19 ENCOUNTER — Encounter: Payer: BC Managed Care – PPO | Attending: Pulmonary Disease

## 2023-09-19 DIAGNOSIS — D860 Sarcoidosis of lung: Secondary | ICD-10-CM | POA: Insufficient documentation

## 2023-09-21 DIAGNOSIS — H903 Sensorineural hearing loss, bilateral: Secondary | ICD-10-CM | POA: Diagnosis not present

## 2023-09-23 DIAGNOSIS — G4733 Obstructive sleep apnea (adult) (pediatric): Secondary | ICD-10-CM | POA: Diagnosis not present

## 2023-09-23 DIAGNOSIS — I10 Essential (primary) hypertension: Secondary | ICD-10-CM | POA: Diagnosis not present

## 2023-09-28 ENCOUNTER — Encounter: Payer: BC Managed Care – PPO | Admitting: *Deleted

## 2023-10-04 ENCOUNTER — Encounter: Payer: Self-pay | Admitting: *Deleted

## 2023-10-04 DIAGNOSIS — D860 Sarcoidosis of lung: Secondary | ICD-10-CM

## 2023-10-04 NOTE — Progress Notes (Signed)
Pulmonary Individual Treatment Plan  Patient Details  Name: Stacie [name] MRN: [id number] Date of Birth: 1956-05-30 Referring Provider:   Flowsheet Row Pulmonary Rehab from 06/12/2023 in [facility] Cardiac and Pulmonary Rehab  Referring Provider Vida Rigger       Initial Encounter Date:  Flowsheet Row Pulmonary Rehab from 06/12/2023 in [facility] Cardiac and Pulmonary Rehab  Date 06/12/23       Visit Diagnosis: Sarcoidosis of lung (HCC)  Patient's Home Medications on Admission:  Current Outpatient Medications:    acetaminophen (TYLENOL) 500 MG tablet, Take 500 mg by mouth every 6 (six) hours as needed for moderate pain or headache., Disp: , Rfl:    albuterol (ACCUNEB) 1.25 MG/3ML nebulizer solution, Take 1 ampule by nebulization as needed., Disp: , Rfl:    azithromycin (ZITHROMAX) 250 MG tablet, Take 250 mg by mouth 3 (three) times a week., Disp: , Rfl:    budesonide (PULMICORT) 0.25 MG/2ML nebulizer solution, Take 0.25 mg by nebulization daily as needed., Disp: , Rfl:    denosumab (PROLIA) 60 MG/ML SOSY injection, Inject 60 mg into the skin every 6 (six) months., Disp: , Rfl:    dorzolamide (TRUSOPT) 2 % ophthalmic solution, Place 1 drop into both eyes 2 (two) times daily., Disp: , Rfl:    escitalopram (LEXAPRO) 20 MG tablet, TAKE 1 TABLET BY MOUTH EVERY DAY, Disp: 90 tablet, Rfl: 3   Fluticasone-Umeclidin-Vilant (TRELEGY ELLIPTA) 200-62.5-25 MCG/ACT AEPB, Inhale into the lungs daily., Disp: , Rfl:    latanoprost (XALATAN) 0.005 % ophthalmic solution, Place 1 drop into the right eye 2 (two) times daily., Disp: , Rfl:    levothyroxine (SYNTHROID) 137 MCG tablet, Take 1 tablet (137 mcg total) by mouth daily before breakfast., Disp: 90 tablet, Rfl: 3   Multiple Vitamins-Minerals (MULTIVITAMIN WITH MINERALS) tablet, Take 1 tablet by mouth daily., Disp: , Rfl:    naproxen sodium (ALEVE) 220 MG tablet, Take 220 mg by mouth daily as needed (pain)., Disp: , Rfl:    NON FORMULARY, CPAP nightly,  Disp: , Rfl:    OXYGEN, Inhale 2 L into the lungs daily as needed (During walking and activity). , Disp: , Rfl:    predniSONE (DELTASONE) 10 MG tablet, Take 2 tablets (20 mg total) by mouth daily. Resume only after the finishing new Rx for prednisone (Patient taking differently: Take 13 mg by mouth daily.), Disp: , Rfl:    Probiotic Product (PROBIOTIC PO), Take 1 capsule by mouth daily., Disp: , Rfl:    Treprostinil (TYVASO) 0.6 MG/ML SOLN, Inhale into the lungs See admin instructions. 12 breaths 4 times a day, Disp: , Rfl:   Past Medical History: Past Medical History:  Diagnosis Date   Acute renal failure (HCC)    Acute respiratory failure (HCC) 10/23/2021   Acute respiratory failure with hypoxia (HCC)    ARF (acute respiratory failure) (HCC) 10/19/2021   Arrhythmia    patient unaware if this is current   Asthma    Chronic kidney disease    Critical lower limb ischemia (HCC)    Depression    GERD (gastroesophageal reflux disease)    Heart murmur    History of kidney stones    HOH (hard of hearing)    wear aids   Hyperthyroidism    Hypothyroidism    IBS (irritable bowel syndrome)    Pneumonia    Pulmonary hypertension (HCC)    Sarcoid    Sarcoidosis    Seasonal allergies    Sleep apnea CPAP with O2  Stroke (HCC) 07/2020   watershed   Wears hearing aid in both ears     Tobacco Use: Social History   Tobacco Use  Smoking Status Never  Smokeless Tobacco Never    Labs: Review Flowsheet  More data exists      Latest Ref Rng & Units 07/21/2020 01/14/2021 10/19/2021 06/24/2022 12/23/2022  Labs for ITP Cardiac and Pulmonary Rehab  Cholestrol 0 - 200 mg/dL 540  - - - -  LDL (calc) 0 - 99 mg/dL 981  - - - -  HDL-C >19 mg/dL 33  - - - -  Trlycerides <150 mg/dL 147  - - - -  Hemoglobin A1c 4.8 - 5.6 % - - - 6.1  -  PH, Arterial 7.35 - 7.45 - - - - 7.413   PCO2 arterial 32 - 48 mmHg - - - - 44.3   Bicarbonate 20.0 - 28.0 mmol/L - - 31.7  - 28.3  30.0   TCO2 22 - 32 mmol/L - 30  - -  30  32   O2 Saturation % - - 33.6  - 95  65     Details       Multiple values from one day are sorted in reverse-chronological order          Pulmonary Assessment Scores:  Pulmonary Assessment Scores     Row Name 06/12/23 1620         ADL UCSD   ADL Phase Entry     SOB Score total 91     Rest 1     Walk 3     Stairs 5     Bath 3     Dress 4     Shop 5       CAT Score   CAT Score 18       mMRC Score   mMRC Score 3              UCSD: Self-administered rating of dyspnea associated with activities of daily living (ADLs) 6-point scale (0 = "not at all" to 5 = "maximal or unable to do because of breathlessness")  Scoring Scores range from 0 to 120.  Minimally important difference is 5 units  CAT: CAT can identify the health impairment of COPD patients and is better correlated with disease progression.  CAT has a scoring range of zero to 40. The CAT score is classified into four groups of low (less than 10), medium (10 - 20), high (21-30) and very high (31-40) based on the impact level of disease on health status. A CAT score over 10 suggests significant symptoms.  A worsening CAT score could be explained by an exacerbation, poor medication adherence, poor inhaler technique, or progression of COPD or comorbid conditions.  CAT MCID is 2 points  mMRC: mMRC (Modified Medical Research Council) Dyspnea Scale is used to assess the degree of baseline functional disability in patients of respiratory disease due to dyspnea. No minimal important difference is established. A decrease in score of 1 point or greater is considered a positive change.   Pulmonary Function Assessment:   Exercise Target Goals: Exercise Program Goal: Individual exercise prescription set using results from initial 6 min walk test and THRR while considering  patient's activity barriers and safety.   Exercise Prescription Goal: Initial exercise prescription builds to 30-45 minutes a day of aerobic  activity, 2-3 days per week.  Home exercise guidelines will be given to patient during program as part of exercise prescription  that the participant will acknowledge.  Education: Aerobic Exercise: - Group verbal and visual presentation on the components of exercise prescription. Introduces F.I.T.T principle from ACSM for exercise prescriptions.  Reviews F.I.T.T. principles of aerobic exercise including progression. Written material given at graduation. Flowsheet Row Pulmonary Rehab from 08/10/2018 in [facility] Cardiac and Pulmonary Rehab  Date 05/02/18  Educator [facility]  Instruction Review Code 1- Verbalizes Understanding       Education: Resistance Exercise: - Group verbal and visual presentation on the components of exercise prescription. Introduces F.I.T.T principle from ACSM for exercise prescriptions  Reviews F.I.T.T. principles of resistance exercise including progression. Written material given at graduation.    Education: Exercise & Equipment Safety: - Individual verbal instruction and demonstration of equipment use and safety with use of the equipment. Flowsheet Row Pulmonary Rehab from 06/12/2023 in [facility] Cardiac and Pulmonary Rehab  Date 06/12/23  Educator MB  Instruction Review Code 1- Verbalizes Understanding       Education: Exercise Physiology & General Exercise Guidelines: - Group verbal and written instruction with models to review the exercise physiology of the cardiovascular system and associated critical values. Provides general exercise guidelines with specific guidelines to those with heart or lung disease.  Flowsheet Row Pulmonary Rehab from 08/10/2018 in [facility] Cardiac and Pulmonary Rehab  Date 07/11/18  Educator [facility]  Instruction Review Code 1- Verbalizes Understanding       Education: Flexibility, Balance, Mind/Body Relaxation: - Group verbal and visual presentation with interactive activity on the components of exercise prescription. Introduces F.I.T.T principle from  ACSM for exercise prescriptions. Reviews F.I.T.T. principles of flexibility and balance exercise training including progression. Also discusses the mind body connection.  Reviews various relaxation techniques to help reduce and manage stress (i.e. Deep breathing, progressive muscle relaxation, and visualization). Balance handout provided to take home. Written material given at graduation. Flowsheet Row Pulmonary Rehab from 08/10/2018 in [facility] Cardiac and Pulmonary Rehab  Date 08/08/18  Educator AS  Instruction Review Code 1- Verbalizes Understanding       Activity Barriers & Risk Stratification:  Activity Barriers & Cardiac Risk Stratification - 06/12/23 1637       Activity Barriers & Cardiac Risk Stratification   Activity Barriers Muscular Weakness;Balance Concerns;Other (comment);Deconditioning    Comments Healing from broken left collarbone             6 Minute Walk:  6 Minute Walk     Row Name 06/12/23 1634         6 Minute Walk   Phase Initial     Distance 600 feet     Walk Time 4.77 minutes     # of Rest Breaks 1     MPH 1.43     METS 2.19     RPE 18     Perceived Dyspnea  3     VO2 Peak 7.69     Symptoms Yes (comment)     Comments Lightheadedness at the end of test     Resting HR 117 bpm     Resting BP 150/90     Resting Oxygen Saturation  90 %     Exercise Oxygen Saturation  during 6 min walk 85 %     Max Ex. HR 134 bpm     Max Ex. BP 150/80     2 Minute Post BP 148/80       Interval HR   1 Minute HR 118     2 Minute HR 127     3  Minute HR 128     4 Minute HR 124     5 Minute HR 88     6 Minute HR 134     2 Minute Post HR 116     Interval Heart Rate? Yes       Interval Oxygen   Interval Oxygen? Yes     Baseline Oxygen Saturation % 90 %     1 Minute Oxygen Saturation % 87 %     1 Minute Liters of Oxygen 2 L     2 Minute Oxygen Saturation % 86 %     2 Minute Liters of Oxygen 2 L     3 Minute Oxygen Saturation % 85 %     3 Minute Liters of  Oxygen 2 L     4 Minute Oxygen Saturation % 87 %     4 Minute Liters of Oxygen 2 L     5 Minute Oxygen Saturation % 86 %     5 Minute Liters of Oxygen 2 L     6 Minute Oxygen Saturation % 85 %     6 Minute Liters of Oxygen 2 L     2 Minute Post Oxygen Saturation % 93 %     2 Minute Post Liters of Oxygen 2 L             Oxygen Initial Assessment:  Oxygen Initial Assessment - 04/26/23 1002       Home Oxygen   Home Oxygen Device Portable Concentrator;Home Concentrator    Sleep Oxygen Prescription CPAP    Liters per minute 2    Home Exercise Oxygen Prescription Continuous    Liters per minute 2    Home Resting Oxygen Prescription Continuous    Liters per minute 2    Compliance with Home Oxygen Use Yes      Intervention   Short Term Goals To learn and exhibit compliance with exercise, home and travel O2 prescription;To learn and understand importance of monitoring SPO2 with pulse oximeter and demonstrate accurate use of the pulse oximeter.;To learn and understand importance of maintaining oxygen saturations>88%;To learn and demonstrate proper pursed lip breathing techniques or other breathing techniques. ;To learn and demonstrate proper use of respiratory medications    Long  Term Goals Exhibits compliance with exercise, home  and travel O2 prescription;Verbalizes importance of monitoring SPO2 with pulse oximeter and return demonstration;Maintenance of O2 saturations>88%;Exhibits proper breathing techniques, such as pursed lip breathing or other method taught during program session;Compliance with respiratory medication;Demonstrates proper use of MDI's             Oxygen Re-Evaluation:  Oxygen Re-Evaluation     Row Name 06/13/23 1015 07/13/23 1116 08/31/23 1104         Program Oxygen Prescription   Program Oxygen Prescription Continuous Continuous;E-Tanks Continuous;E-Tanks     Liters per minute 2 4 4        Home Oxygen   Home Oxygen Device Portable Concentrator;Home  Concentrator Portable Concentrator;Home Concentrator --     Sleep Oxygen Prescription CPAP CPAP CPAP     Liters per minute 2 2 2      Home Exercise Oxygen Prescription Continuous Continuous Continuous     Liters per minute 2 4 4      Home Resting Oxygen Prescription Continuous Continuous Continuous     Liters per minute 2 2 2      Compliance with Home Oxygen Use Yes Yes Yes       Goals/Expected Outcomes  Short Term Goals To learn and demonstrate proper pursed lip breathing techniques or other breathing techniques.  To learn and demonstrate proper pursed lip breathing techniques or other breathing techniques.  To learn and understand importance of maintaining oxygen saturations>88%     Long  Term Goals Exhibits proper breathing techniques, such as pursed lip breathing or other method taught during program session Exhibits proper breathing techniques, such as pursed lip breathing or other method taught during program session Maintenance of O2 saturations>88%     Comments Reviewed PLB technique with pt.  Talked about how it works and it's importance in maintaining their exercise saturations. Informed patient how to perform the Pursed Lipped breathing technique. Told patient to Inhale through the nose and out the mouth with pursed lips to keep their airways open, help oxygenate them better, practice when at rest or doing strenuous activity. Patient Verbalizes understanding of technique and will work on and be reiterated during [name]. Stacie [name] has a pulse oximeter to check her oxygen saturation at home. Informed and explained why it is important to have one. Reviewed that oxygen saturations should be 88 percent and above.     Goals/Expected Outcomes Short: Become more profiecient at using PLB. Long: Become independent at using PLB. Short: use PLB with exertion. Long: use PLB on exertion proficiently and independently. Short: monitor oxygen at home with exertion. Long: maintain oxygen saturations above 88  percent independently.              Oxygen Discharge (Final Oxygen Re-Evaluation):  Oxygen Re-Evaluation - 08/31/23 1104       Program Oxygen Prescription   Program Oxygen Prescription Continuous;E-Tanks    Liters per minute 4      Home Oxygen   Sleep Oxygen Prescription CPAP    Liters per minute 2    Home Exercise Oxygen Prescription Continuous    Liters per minute 4    Home Resting Oxygen Prescription Continuous    Liters per minute 2    Compliance with Home Oxygen Use Yes      Goals/Expected Outcomes   Short Term Goals To learn and understand importance of maintaining oxygen saturations>88%    Long  Term Goals Maintenance of O2 saturations>88%    Comments Stacie [name] has a pulse oximeter to check her oxygen saturation at home. Informed and explained why it is important to have one. Reviewed that oxygen saturations should be 88 percent and above.    Goals/Expected Outcomes Short: monitor oxygen at home with exertion. Long: maintain oxygen saturations above 88 percent independently.             Initial Exercise Prescription:  Initial Exercise Prescription - 08/23/23 1500       Biostep-RELP   METs 2             Perform Capillary Blood Glucose checks as needed.  Exercise Prescription Changes:   Exercise Prescription Changes     Row Name 06/12/23 1600 06/29/23 1300 07/11/23 1400 07/26/23 1000 08/10/23 0700     Response to Exercise   Blood Pressure (Admit) 150/90 114/62 128/70 126/70 132/64   Blood Pressure (Exercise) 150/80 130/64 -- 120/70 134/70   Blood Pressure (Exit) 112/80 120/70 114/68 118/60 124/68   Heart Rate (Admit) 117 bpm 103 bpm 103 bpm 114 bpm 105 bpm   Heart Rate (Exercise) 134 bpm 124 bpm 119 bpm 115 bpm 115 bpm   Heart Rate (Exit) 117 bpm 97 bpm 102 bpm 78 bpm 104 bpm   Oxygen Saturation (Admit)  90 % 92 % 94 % 92 % 93 %   Oxygen Saturation (Exercise) 85 % 87 % 88 % 92 % 88 %   Oxygen Saturation (Exit) 91 % 94 % 96 % 93 % 96 %   Rating of  Perceived Exertion (Exercise) 18 17 14 13 13    Perceived Dyspnea (Exercise) 3 4 3  0 3   Symptoms Lightheadedness at end of none none none none   Comments results First two days of exercise -- -- --   Duration -- Progress to 30 minutes of  aerobic without signs/symptoms of physical distress Progress to 30 minutes of  aerobic without signs/symptoms of physical distress Progress to 30 minutes of  aerobic without signs/symptoms of physical distress Progress to 30 minutes of  aerobic without signs/symptoms of physical distress   Intensity -- THRR unchanged THRR unchanged THRR unchanged THRR unchanged     Progression   Progression -- Continue to progress workloads to maintain intensity without signs/symptoms of physical distress. Continue to progress workloads to maintain intensity without signs/symptoms of physical distress. Continue to progress workloads to maintain intensity without signs/symptoms of physical distress. Continue to progress workloads to maintain intensity without signs/symptoms of physical distress.   Average METs -- 1.75 2.04 2.7 2.94     Resistance Training   Training Prescription -- Yes Yes Yes Yes   Weight -- 2lb 2 lb 2 lb 2 lb   Reps -- 10-15 10-15 10-15 10-15     Interval Training   Interval Training -- No No No No     Oxygen   Oxygen -- Continuous Continuous Continuous Continuous   Liters -- 3-4 4 4 4      Treadmill   MPH -- 1.2 1 -- --   Grade -- 0 0 -- --   Minutes -- 10 15 -- --   METs -- 1.92 1.77 -- --     Recumbant Bike   Level -- -- 1 1 1    Watts -- -- 25 25 25    Minutes -- -- 15 15 15    METs -- -- 2.92 2.93 2.94     NuStep   Level -- 2  T6 3 2 2    Minutes -- 15 15 15 15    METs -- -- 2 2.4 --     REL-XR   Level -- -- -- -- 1   Minutes -- -- -- -- 15     T5 Nustep   Level -- -- 1 -- 1   Minutes -- -- 15 -- 15   METs -- -- 1.7 -- --     Oxygen   Maintain Oxygen Saturation -- 88% or higher 88% or higher 88% or higher --    Row Name  08/23/23 1500 09/07/23 1600 09/18/23 1000         Response to Exercise   Blood Pressure (Admit) 124/62 134/76 140/60     Blood Pressure (Exercise) 140/60 -- --     Blood Pressure (Exit) 138/72 122/60 132/76     Heart Rate (Admit) 109 bpm 108 bpm 94 bpm     Heart Rate (Exercise) 115 bpm 124 bpm 112 bpm     Heart Rate (Exit) 121 bpm 108 bpm 104 bpm     Oxygen Saturation (Admit) 88 % 91 % 96 %     Oxygen Saturation (Exercise) 92 % 91 % 92 %     Oxygen Saturation (Exit) 85 % 94 % 94 %     Rating  of Perceived Exertion (Exercise) 11 13 12      Perceived Dyspnea (Exercise) 2 3 --     Symptoms none none none     Duration Progress to 30 minutes of  aerobic without signs/symptoms of physical distress Progress to 30 minutes of  aerobic without signs/symptoms of physical distress Progress to 30 minutes of  aerobic without signs/symptoms of physical distress     Intensity THRR unchanged THRR unchanged THRR unchanged       Progression   Progression Continue to progress workloads to maintain intensity without signs/symptoms of physical distress. Continue to progress workloads to maintain intensity without signs/symptoms of physical distress. Continue to progress workloads to maintain intensity without signs/symptoms of physical distress.     Average METs 1.95 2.18 1.81       Resistance Training   Training Prescription Yes Yes Yes     Weight 2 lb 2 lb 2 lb     Reps 10-15 10-15 10-15       Interval Training   Interval Training No No No       Oxygen   Oxygen Continuous Continuous Continuous     Liters 4 4 4        Treadmill   MPH -- 1.5 1.2     Grade -- 0 0     Minutes -- 15 15     METs -- 2.15 1.92       Recumbant Bike   Level -- 1 --     Watts -- 25 --     Minutes -- 15 --     METs -- 2.93 --       NuStep   Level 1 1 --     Minutes 15 15 --     METs 1.9 2.1 --       T5 Nustep   Level -- -- 1     Minutes -- -- 15     METs -- -- 1.7       Biostep-RELP   Level 1 2 --      Minutes 15 15 --     METs -- 2 --       Oxygen   Maintain Oxygen Saturation 88% or higher 88% or higher 88% or higher              Exercise Comments:   Exercise Comments     Row Name 06/13/23 1015           Exercise Comments First full day of exercise!  Patient was oriented to gym and equipment including functions, settings, policies, and procedures.  Patient's individual exercise prescription and treatment plan were reviewed.  All starting workloads were established based on the results of the 6 minute walk test done at initial orientation visit.  The plan for exercise progression was also introduced and progression will be customized based on patient's performance and goals.                Exercise Goals and Review:   Exercise Goals     Row Name 06/12/23 1643             Exercise Goals   Increase Physical Activity Yes       Intervention Provide advice, education, support and counseling about physical activity/exercise needs.;Develop an individualized exercise prescription for aerobic and resistive training based on initial evaluation findings, risk stratification, comorbidities and participant's personal goals.       Expected Outcomes Short Term: Attend rehab on a regular  basis to increase amount of physical activity.;Long Term: Exercising regularly at least 3-5 days a week.;Long Term: Add in home exercise to make exercise part of routine and to increase amount of physical activity.       Increase Strength and Stamina Yes       Intervention Provide advice, education, support and counseling about physical activity/exercise needs.;Develop an individualized exercise prescription for aerobic and resistive training based on initial evaluation findings, risk stratification, comorbidities and participant's personal goals.       Expected Outcomes Short Term: Increase workloads from initial exercise prescription for resistance, speed, and METs.;Short Term: Perform resistance  training exercises routinely during rehab and add in resistance training at home;Long Term: Improve cardiorespiratory fitness, muscular endurance and strength as measured by increased METs and functional capacity ( )       Able to understand and use rate of perceived exertion (RPE) scale Yes       Intervention Provide education and explanation on how to use RPE scale       Expected Outcomes Short Term: Able to use RPE daily in rehab to express subjective intensity level;Long Term:  Able to use RPE to guide intensity level when exercising independently       Able to understand and use Dyspnea scale Yes       Intervention Provide education and explanation on how to use Dyspnea scale       Expected Outcomes Short Term: Able to use Dyspnea scale daily in rehab to express subjective sense of shortness of breath during exertion;Long Term: Able to use Dyspnea scale to guide intensity level when exercising independently       Knowledge and understanding of Target Heart Rate Range (THRR) Yes       Intervention Provide education and explanation of THRR including how the numbers were predicted and where they are located for reference       Expected Outcomes Short Term: Able to state/look up THRR;Short Term: Able to use daily as guideline for intensity in rehab;Long Term: Able to use THRR to govern intensity when exercising independently       Able to check pulse independently Yes       Intervention Provide education and demonstration on how to check pulse in carotid and radial arteries.;Review the importance of being able to check your own pulse for safety during independent exercise       Expected Outcomes Short Term: Able to explain why pulse checking is important during independent exercise;Long Term: Able to check pulse independently and accurately       Understanding of Exercise Prescription Yes       Intervention Provide education, explanation, and written materials on patient's individual exercise  prescription       Expected Outcomes Short Term: Able to explain program exercise prescription;Long Term: Able to explain home exercise prescription to exercise independently                Exercise Goals Re-Evaluation :  Exercise Goals Re-Evaluation     Row Name 06/13/23 1016 06/29/23 1345 07/11/23 1423 07/26/23 1046 08/10/23 0709     Exercise Goal Re-Evaluation   Exercise Goals Review Understanding of Exercise Prescription;Knowledge and understanding of Target Heart Rate Range (THRR);Able to understand and use Dyspnea scale;Able to understand and use rate of perceived exertion (RPE) scale Increase Physical Activity;Increase Strength and Stamina;Understanding of Exercise Prescription Increase Physical Activity;Increase Strength and Stamina;Understanding of Exercise Prescription Increase Physical Activity;Increase Strength and Stamina;Understanding of Exercise Prescription Increase Physical Activity;Increase  Strength and Stamina;Understanding of Exercise Prescription   Comments Reviewed RPE  and dyspnea scale, THR and program prescription with pt today.  Pt voiced understanding and was given a copy of goals to take home.       Short: Use RPE daily to regulate intensity.  Long: Follow program prescription in THR. Stacie [name] is off to a good start in the program. She is doing well on the treadmill as she was able to walk at a speed of 1.2 mph for 10 minutes with no incline. She also improved to level 2 on the T6 nustep during her second session. We will continue to monitor her progress in the program. Stacie [name] is doing well in rehab. She improved to level 3 on the T4 nustep and worked at level 1 on the recumbent bike and T5 nustep. She did see a decrease in her speed on the treadmill from 1.2 mph to 1.0 mph. We will continue to monitor her progress in the program. Stacie [name] is doing well in rehab. She only attended one session during this review. In that one session she was able to maintain her intensity of level  1 on the recumbent bike. She also decreased her level on the T4 nustep from 3 back down to 2. We will continue to monitor her progress in the program. Stacie [name] continues to do well in rehab. She only attended two sessions during the time of this review. In those two sessions she was able to add the XR, at level 1, to her current exercise prescription. We will continue to monitor her progress in the program.   Expected Outcomes Short: Use RPE daily to regulate intensity. Long: Follow program prescription in THR. Short: Continue to follow current exercise prescription. Long: Continue exercise to improve strength and stamina. Short: Increase treadmill speed back up to 1.2 mph. Long: Continue exercise to improve strength and stamina. Short: Increase level on the T4 nustep back to 3, try level 2 on the recumbent bike. Long: Continue exercise to improve strength and stamina. Short: Increase level on the T4 nustep back to 3, try level 2 on the recumbent bike. Long: Continue exercise to improve strength and stamina.    Row Name 08/23/23 1532 09/07/23 1627 09/18/23 1059         Exercise Goal Re-Evaluation   Exercise Goals Review Increase Physical Activity;Increase Strength and Stamina;Understanding of Exercise Prescription Increase Physical Activity;Increase Strength and Stamina;Understanding of Exercise Prescription Increase Physical Activity;Increase Strength and Stamina;Understanding of Exercise Prescription     Comments Stacie [name] continues to do well in rehab. She has only attended one session during this review. She was able to use the biostep and T4 nustep at level 1. We will continue to monitor her progress in the program. Stacie [name] is doing well in rehab. She has been able to improve from level 1 to level 2 on the biostep. She has also increased her speed on the treadmill from 1 mph to 1.5 mph. We will continue to monitor her progress in the program. Stacie [name] is doing well in rehab. She only attended one rehab session  during this review. She was not able to make any improvements during this one session, but she did maintain her workload on the treadmill and T5 nustep. We will continue to monitor her progress in the program.     Expected Outcomes Short: Increase level on the T4 nustep back to 3, try level 2 on the recumbent bike. Long: Continue exercise to improve strength and stamina. Short: Increase  level on the T4 nustep back to 3, try level 2 on the recumbent bike. Long: Continue exercise to improve strength and stamina. Short: Attend the program more frequently. Long: Continue exercise to improve strength and stamina.              Discharge Exercise Prescription (Final Exercise Prescription Changes):  Exercise Prescription Changes - 09/18/23 1000       Response to Exercise   Blood Pressure (Admit) 140/60    Blood Pressure (Exit) 132/76    Heart Rate (Admit) 94 bpm    Heart Rate (Exercise) 112 bpm    Heart Rate (Exit) 104 bpm    Oxygen Saturation (Admit) 96 %    Oxygen Saturation (Exercise) 92 %    Oxygen Saturation (Exit) 94 %    Rating of Perceived Exertion (Exercise) 12    Symptoms none    Duration Progress to 30 minutes of  aerobic without signs/symptoms of physical distress    Intensity THRR unchanged      Progression   Progression Continue to progress workloads to maintain intensity without signs/symptoms of physical distress.    Average METs 1.81      Resistance Training   Training Prescription Yes    Weight 2 lb    Reps 10-15      Interval Training   Interval Training No      Oxygen   Oxygen Continuous    Liters 4      Treadmill   MPH 1.2    Grade 0    Minutes 15    METs 1.92      T5 Nustep   Level 1    Minutes 15    METs 1.7      Oxygen   Maintain Oxygen Saturation 88% or higher             Nutrition:  Target Goals: Understanding of nutrition guidelines, daily intake of sodium 1500mg , cholesterol 200mg , calories 30% from fat and 7% or less from  saturated fats, daily to have 5 or more servings of fruits and vegetables.  Education: All About Nutrition: -Group instruction provided by verbal, written material, interactive activities, discussions, models, and posters to present general guidelines for heart healthy nutrition including fat, fiber, MyPlate, the role of sodium in heart healthy nutrition, utilization of the nutrition label, and utilization of this knowledge for meal planning. Follow up email sent as well. Written material given at graduation. Flowsheet Row Pulmonary Rehab from 08/10/2018 in [facility] Cardiac and Pulmonary Rehab  Date 06/27/18  Educator LB  Instruction Review Code 1- Verbalizes Understanding       Biometrics:  Pre Biometrics - 06/12/23 1644       Pre Biometrics   Height 5\' 7"  (1.702 m)    Weight 187 lb 11.2 oz (85.1 kg)    Waist Circumference 44.5 inches    Hip Circumference 49 inches    Waist to Hip Ratio 0.91 %    BMI (Calculated) 29.39    Single Leg Stand 3.6 seconds              Nutrition Therapy Plan and Nutrition Goals:   Nutrition Assessments:  MEDIFICTS Score Key: >=70 Need to make dietary changes  40-70 Heart Healthy Diet <= 40 Therapeutic Level Cholesterol Diet  Flowsheet Row Pulmonary Rehab from 06/12/2023 in [facility] Cardiac and Pulmonary Rehab  Picture Your Plate Total Score on Admission 64      Picture Your Plate Scores: <86 Unhealthy dietary pattern with much  room for improvement. 41-50 Dietary pattern unlikely to meet recommendations for good health and room for improvement. 51-60 More healthful dietary pattern, with some room for improvement.  >60 Healthy dietary pattern, although there may be some specific behaviors that could be improved.   Nutrition Goals Re-Evaluation:  Nutrition Goals Re-Evaluation     Row Name 07/13/23 1121 08/31/23 1106           Goals   Current Weight 185 lb (83.9 kg) 187 lb (84.8 kg)      Nutrition Goal -- Eat more nutrient rich foods       Comment Patient was informed on why it is important to maintain a balanced diet when dealing with Respiratory issues. Explained that it takes a lot of energy to breath and when they are short of breath often they will need to have a good diet to help keep up with the calories they are expending for breathing. Stacie [name] wants to eat a "cleaner" diet. Her daughter is working with her to try to eliminate carbs  more and make healthier choices.      Expected Outcome Short: Choose and plan snacks accordingly to patients caloric intake to improve breathing. Long: Maintain a diet independently that meets their caloric intake to aid in daily shortness of breath. Short: eat less carbs. Long: adhere to a diet that pertains to her.               Nutrition Goals Discharge (Final Nutrition Goals Re-Evaluation):  Nutrition Goals Re-Evaluation - 08/31/23 1106       Goals   Current Weight 187 lb (84.8 kg)    Nutrition Goal Eat more nutrient rich foods    Comment Stacie [name] wants to eat a "cleaner" diet. Her daughter is working with her to try to eliminate carbs  more and make healthier choices.    Expected Outcome Short: eat less carbs. Long: adhere to a diet that pertains to her.             Psychosocial: Target Goals: Acknowledge presence or absence of significant depression and/or stress, maximize coping skills, provide positive support system. Participant is able to verbalize types and ability to use techniques and skills needed for reducing stress and depression.   Education: Stress, Anxiety, and Depression - Group verbal and visual presentation to define topics covered.  Reviews how body is impacted by stress, anxiety, and depression.  Also discusses healthy ways to reduce stress and to treat/manage anxiety and depression.  Written material given at graduation. Flowsheet Row Pulmonary Rehab from 08/10/2022 in [facility] Cardiac and Pulmonary Rehab  Date 08/10/22  Educator [facility]  Instruction Review Code 1-  [employer] Understanding       Education: Sleep Hygiene -Provides group verbal and written instruction about how sleep can affect your health.  Define sleep hygiene, discuss sleep cycles and impact of sleep habits. Review good sleep hygiene tips.  Flowsheet Row Pulmonary Rehab from 08/10/2018 in [facility] Cardiac and Pulmonary Rehab  Date 04/11/18  Educator [facility]  Instruction Review Code 1- Verbalizes Understanding       Initial Review & Psychosocial Screening:  Initial Psych Review & Screening - 04/26/23 1017       Initial Review   Current issues with None Identified      Family Dynamics   Good Support System? Yes   husband, daughters, family     Barriers   Psychosocial barriers to participate in program There are no identifiable barriers or psychosocial needs.;The patient should benefit  from training in stress management and relaxation.      Screening Interventions   Interventions Encouraged to exercise;Provide feedback about the scores to participant    Expected Outcomes Short Term goal: Utilizing psychosocial counselor, staff and physician to assist with identification of specific Stressors or current issues interfering with healing process. Setting desired goal for each stressor or current issue identified.;Long Term Goal: Stressors or current issues are controlled or eliminated.;Short Term goal: Identification and review with participant of any Quality of Life or Depression concerns found by scoring the questionnaire.;Long Term goal: The participant improves quality of Life and PHQ9 Scores as seen by post scores and/or verbalization of changes             Quality of Life Scores:  Scores of 19 and below usually indicate a poorer quality of life in these areas.  A difference of  2-3 points is a clinically meaningful difference.  A difference of 2-3 points in the total score of the Quality of Life Index has been associated with significant improvement in overall quality of life,  self-image, physical symptoms, and general health in studies assessing change in quality of life.  PHQ-9: Review Flowsheet  More data exists      06/12/2023 07/20/2022 06/24/2022 10/13/2021 09/28/2021  Depression screen PHQ 2/9  Decreased Interest 1 0 0 0 0  Down, Depressed, Hopeless 0 0 0 0 0  PHQ - 2 Score 1 0 0 0 0  Altered sleeping 0 1 0 0 0  Tired, decreased energy 2 1 0 0 0  Change in appetite 0 0 0 0 0  Feeling bad or failure about yourself  0 0 0 0 0  Trouble concentrating 0 1 0 0 0  Moving slowly or fidgety/restless 0 0 0 0 0  Suicidal thoughts 0 0 0 0 0  PHQ-9 Score 3 3 0 0 0  Difficult doing work/chores Somewhat difficult Not difficult at all Not difficult at all Not difficult at all Not difficult at all   Interpretation of Total Score  Total Score Depression Severity:  1-4 = Minimal depression, 5-9 = Mild depression, 10-14 = Moderate depression, 15-19 = Moderately severe depression, 20-27 = Severe depression   Psychosocial Evaluation and Intervention:  Psychosocial Evaluation - 04/26/23 1018       Psychosocial Evaluation & Interventions   Interventions Encouraged to exercise with the program and follow exercise prescription    Comments Stacie [name] is returning to pulmonary rehab after having to discharge early in January due to health concerns. Since then, her cardiac work up has been completed with no concerns. She has noticed her breathing has gotten worse because she has not been as active. She also notes that her legs are weak and her stamina has decreased. She recently fell and doesn't really know if it was related to blacking out with bending over movement or something else. She sees her doctor this Friday. She is motivated to return and wants to be able to keep up with her family. When asked about stress, she states she has a great outlook on life thanks to her family and everyone is doing well.    Expected Outcomes Short: attend pulmonary rehab for education and exercise.  Long: develop and maintain positive self care habits.    Continue Psychosocial Services  Follow up required by staff             Psychosocial Re-Evaluation:  Psychosocial Re-Evaluation     Row Name 07/13/23 1119 08/31/23 1110  Psychosocial Re-Evaluation   Current issues with Current Psychotropic Meds;Current Stress Concerns Current Stress Concerns      Comments Her depression and stress concerns stem from her health. She thinks she is doing better with her mental health with all her life changes. Her daughter helps her alot and is trying to give her daughter time to have a life for herself. Patient reports no issues with their current mental states, sleep, stress, depression or anxiety. Will follow up with patient in a few weeks for any changes.      Expected Outcomes Short; Conitnue to exercise for menatl boost Long: Continue to stay positive Short: Continue to exercise regularly to support mental health and notify staff of any changes. Long: maintain mental health and well being through teaching of rehab or prescribed medications independently.      Interventions Encouraged to attend Pulmonary Rehabilitation for the exercise Encouraged to attend Pulmonary Rehabilitation for the exercise      Continue Psychosocial Services  Follow up required by staff Follow up required by staff               Psychosocial Discharge (Final Psychosocial Re-Evaluation):  Psychosocial Re-Evaluation - 08/31/23 1110       Psychosocial Re-Evaluation   Current issues with Current Stress Concerns    Comments Patient reports no issues with their current mental states, sleep, stress, depression or anxiety. Will follow up with patient in a few weeks for any changes.    Expected Outcomes Short: Continue to exercise regularly to support mental health and notify staff of any changes. Long: maintain mental health and well being through teaching of rehab or prescribed medications independently.     Interventions Encouraged to attend Pulmonary Rehabilitation for the exercise    Continue Psychosocial Services  Follow up required by staff             Education: Education Goals: Education classes will be provided on a weekly basis, covering required topics. Participant will state understanding/return demonstration of topics presented.  Learning Barriers/Preferences:  Learning Barriers/Preferences - 04/26/23 1008       Learning Barriers/Preferences   Learning Barriers None    Learning Preferences None             General Pulmonary Education Topics:  Infection Prevention: - Provides verbal and written material to individual with discussion of infection control including proper hand washing and proper equipment cleaning during exercise session. Flowsheet Row Pulmonary Rehab from 06/12/2023 in [facility] Cardiac and Pulmonary Rehab  Date 06/12/23  Educator MB  Instruction Review Code 1- Verbalizes Understanding       Falls Prevention: - Provides verbal and written material to individual with discussion of falls prevention and safety. Flowsheet Row Pulmonary Rehab from 06/12/2023 in [facility] Cardiac and Pulmonary Rehab  Date 06/12/23  Educator MB  Instruction Review Code 1- Verbalizes Understanding       Chronic Lung Disease Review: - Group verbal instruction with posters, models, PowerPoint presentations and videos,  to review new updates, new respiratory medications, new advancements in procedures and treatments. Providing information on websites and "800" numbers for continued self-education. Includes information about supplement oxygen, available portable oxygen systems, continuous and intermittent flow rates, oxygen safety, concentrators, and Medicare reimbursement for oxygen. Explanation of Pulmonary Drugs, including class, frequency, complications, importance of spacers, rinsing mouth after steroid MDI's, and proper cleaning methods for nebulizers. Review of basic lung  anatomy and physiology related to function, structure, and complications of lung disease. Review of risk factors.  Discussion about methods for diagnosing sleep apnea and types of masks and machines for OSA. Includes a review of the use of types of environmental controls: home humidity, furnaces, filters, dust mite/pet prevention, HEPA vacuums. Discussion about weather changes, air quality and the benefits of nasal washing. Instruction on Warning signs, infection symptoms, calling MD promptly, preventive modes, and value of vaccinations. Review of effective airway clearance, coughing and/or vibration techniques. Emphasizing that all should Create an Action Plan. Written material given at graduation. Flowsheet Row Pulmonary Rehab from 08/10/2018 in [facility] Cardiac and Pulmonary Rehab  Date 06/29/18  Educator [facility]  Instruction Review Code 1- Verbalizes Understanding       AED/CPR: - Group verbal and written instruction with the use of models to demonstrate the basic use of the AED with the basic ABC's of resuscitation.    Anatomy and Cardiac Procedures: - Group verbal and visual presentation and models provide information about basic cardiac anatomy and function. Reviews the testing methods done to diagnose heart disease and the outcomes of the test results. Describes the treatment choices: Medical Management, Angioplasty, or Coronary Bypass Surgery for treating various heart conditions including Myocardial Infarction, Angina, Valve Disease, and Cardiac Arrhythmias.  Written material given at graduation. Flowsheet Row Pulmonary Rehab from 08/10/2018 in [facility] Cardiac and Pulmonary Rehab  Date 05/16/18  Educator [facility]  Instruction Review Code 5- Refused Teaching       Medication Safety: - Group verbal and visual instruction to review commonly prescribed medications for heart and lung disease. Reviews the medication, class of the drug, and side effects. Includes the steps to properly store meds and  maintain the prescription regimen.  Written material given at graduation. Flowsheet Row Pulmonary Rehab from 08/10/2018 in [facility] Cardiac and Pulmonary Rehab  Date 08/10/18  Educator [facility]  Instruction Review Code 1- Verbalizes Understanding       Other: -Provides group and verbal instruction on various topics (see comments)   Knowledge Questionnaire Score:  Knowledge Questionnaire Score - 06/12/23 1624       Knowledge Questionnaire Score   Pre Score 18/18              Core Components/Risk Factors/Patient Goals at Admission:  Personal Goals and Risk Factors at Admission - 06/12/23 1646       Core Components/Risk Factors/Patient Goals on Admission    Weight Management Yes;Weight Maintenance;Weight Loss    Intervention Weight Management: Develop a combined nutrition and exercise program designed to reach desired caloric intake, while maintaining appropriate intake of nutrient and fiber, sodium and fats, and appropriate energy expenditure required for the weight goal.;Weight Management: Provide education and appropriate resources to help participant work on and attain dietary goals.;Weight Management/Obesity: Establish reasonable short term and long term weight goals.    Admit Weight 187 lb 11.2 oz (85.1 kg)    Goal Weight: Short Term 172 lb (78 kg)    Goal Weight: Long Term 150 lb (68 kg)    Expected Outcomes Short Term: Continue to assess and modify interventions until short term weight is achieved;Long Term: Adherence to nutrition and physical activity/exercise program aimed toward attainment of established weight goal;Weight Loss: Understanding of general recommendations for a balanced deficit meal plan, which promotes 1-2 lb weight loss per week and includes a negative energy balance of [phone] kcal/d;Understanding recommendations for meals to include 15-35% energy as protein, 25-35% energy from fat, 35-60% energy from carbohydrates, less than 200mg  of dietary cholesterol, 20-35 gm  of total fiber daily;Understanding of distribution of calorie  intake throughout the day with the consumption of 4-5 meals/snacks    Improve shortness of breath with ADL's Yes    Intervention Provide education, individualized exercise plan and daily activity instruction to help decrease symptoms of SOB with activities of daily living.    Expected Outcomes Short Term: Improve cardiorespiratory fitness to achieve a reduction of symptoms when performing ADLs;Long Term: Be able to perform more ADLs without symptoms or delay the onset of symptoms    Hypertension Yes    Intervention Provide education on lifestyle modifcations including regular physical activity/exercise, weight management, moderate sodium restriction and increased consumption of fresh fruit, vegetables, and low fat dairy, alcohol moderation, and smoking cessation.;Monitor prescription use compliance.    Expected Outcomes Short Term: Continued assessment and intervention until BP is < 140/71mm HG in hypertensive participants. < 130/23mm HG in hypertensive participants with diabetes, heart failure or chronic kidney disease.;Long Term: Maintenance of blood pressure at goal levels.    Lipids Yes    Intervention Provide education and support for participant on nutrition & aerobic/resistive exercise along with prescribed medications to achieve LDL 70mg , HDL >40mg .    Expected Outcomes Short Term: Participant states understanding of desired cholesterol values and is compliant with medications prescribed. Participant is following exercise prescription and nutrition guidelines.;Long Term: Cholesterol controlled with medications as prescribed, with individualized exercise RX and with personalized nutrition plan. Value goals: LDL < 70mg , HDL > 40 mg.             Education:Diabetes - Individual verbal and written instruction to review signs/symptoms of diabetes, desired ranges of glucose level fasting, after meals and with exercise. Acknowledge that  pre and post exercise glucose checks will be done for 3 sessions at entry of program.   Know Your Numbers and Heart Failure: - Group verbal and visual instruction to discuss disease risk factors for cardiac and pulmonary disease and treatment options.  Reviews associated critical values for Overweight/Obesity, Hypertension, Cholesterol, and Diabetes.  Discusses basics of heart failure: signs/symptoms and treatments.  Introduces Heart Failure Zone chart for action plan for heart failure.  Written material given at graduation. Flowsheet Row Pulmonary Rehab from 08/10/2022 in [facility] Cardiac and Pulmonary Rehab  Date 07/27/22  Educator SB  Instruction Review Code 1- Verbalizes Understanding       Core Components/Risk Factors/Patient Goals Review:   Goals and Risk Factor Review     Row Name 07/13/23 1118 08/31/23 1109           Core Components/Risk Factors/Patient Goals Review   Personal Goals Review Improve shortness of breath with ADL's Develop more efficient breathing techniques such as purse lipped breathing and diaphragmatic breathing and practicing self-pacing with activity.      Review Spoke to patient about their shortness of breath and what they can do to improve. Patient has been informed of breathing techniques when starting the program. Patient is informed to tell staff if they have had any med changes and that certain meds they are taking or not taking can be causing shortness of breath. Diaphragmatic and PLB breathing explained and performed with patient. Patient has a better understanding of how to do these exercises to help with breathing performance and relaxation. Patient performed breathing techniques adequately and to practice further at home.      Expected Outcomes Short: Attend [name] regularly to improve shortness of breath with ADL's. Long: maintain independence with ADL's Short: practice PLB and diaphragmatic breathing at home. Long: Use PLB and diaphragmatic breathing  independently post  [name].               Core Components/Risk Factors/Patient Goals at Discharge (Final Review):   Goals and Risk Factor Review - 08/31/23 1109       Core Components/Risk Factors/Patient Goals Review   Personal Goals Review Develop more efficient breathing techniques such as purse lipped breathing and diaphragmatic breathing and practicing self-pacing with activity.    Review Diaphragmatic and PLB breathing explained and performed with patient. Patient has a better understanding of how to do these exercises to help with breathing performance and relaxation. Patient performed breathing techniques adequately and to practice further at home.    Expected Outcomes Short: practice PLB and diaphragmatic breathing at home. Long: Use PLB and diaphragmatic breathing independently post [name].             ITP Comments:  ITP Comments     Row Name 04/26/23 1016 06/12/23 1633 06/13/23 1015 06/21/23 1042 07/19/23 1218   ITP Comments Initial phone call completed. Diagnosis can be found in [facility] 5/7. EP Orientation scheduled for Wednesday 7/17 at 9:30. Completed and gym orientation. Initial ITP created and sent for review to Vida Rigger, Medical Director. First full day of exercise!  Patient was oriented to gym and equipment including functions, settings, policies, and procedures.  Patient's individual exercise prescription and treatment plan were reviewed.  All starting workloads were established based on the results of the 6 minute walk test done at initial orientation visit.  The plan for exercise progression was also introduced and progression will be customized based on patient's performance and goals. 30 Day review completed. Medical Director ITP review done, changes made as directed, and signed approval by Medical Director.     new to program 30 Day review completed. Medical Director ITP review done, changes made as directed, and signed approval by Medical Director.     Row Name 08/16/23 1308 09/06/23 1157 10/04/23 1113       ITP Comments 30 Day review completed. Medical Director ITP review done, changes made as directed, and signed approval by Medical Director. 30 Day review completed. Medical Director ITP review done, changes made as directed, and signed approval by Medical Director. 30 Day review completed. Medical Director ITP review done, changes made as directed, and signed approval by Medical Director.   out since 11/19              Comments:

## 2023-10-05 ENCOUNTER — Encounter: Payer: BC Managed Care – PPO | Admitting: *Deleted

## 2023-10-05 ENCOUNTER — Telehealth: Payer: Self-pay | Admitting: *Deleted

## 2023-10-05 ENCOUNTER — Encounter: Payer: Self-pay | Admitting: *Deleted

## 2023-10-05 NOTE — Telephone Encounter (Signed)
Attempted to call to check on Stacie [name]. She has not attended rehab since 11/19. No answer at this time. Lmtcb.

## 2023-10-12 ENCOUNTER — Encounter: Payer: Self-pay | Admitting: *Deleted

## 2023-10-12 NOTE — Telephone Encounter (Signed)
Attempted to call Lynden Ang, no answer. Lmtcb.

## 2023-10-16 ENCOUNTER — Encounter: Payer: Self-pay | Admitting: *Deleted

## 2023-10-16 DIAGNOSIS — D860 Sarcoidosis of lung: Secondary | ICD-10-CM

## 2023-10-16 NOTE — Progress Notes (Signed)
Discharge Summary:  Autumm Strohmaier  (DOB: 1956-06-26)  Cathy discharged early from pulmonary rehab due to not being able to make it to class. She completed 14/36 sessions.    6 Minute Walk     Row Name 06/12/23 1634         6 Minute Walk   Phase Initial     Distance 600 feet     Walk Time 4.77 minutes     # of Rest Breaks 1     MPH 1.43     METS 2.19     RPE 18     Perceived Dyspnea  3     VO2 Peak 7.69     Symptoms Yes (comment)     Comments Lightheadedness at the end of test     Resting HR 117 bpm     Resting BP 150/90     Resting Oxygen Saturation  90 %     Exercise Oxygen Saturation  during 6 min walk 85 %     Max Ex. HR 134 bpm     Max Ex. BP 150/80     2 Minute Post BP 148/80       Interval HR   1 Minute HR 118     2 Minute HR 127     3 Minute HR 128     4 Minute HR 124     5 Minute HR 88     6 Minute HR 134     2 Minute Post HR 116     Interval Heart Rate? Yes       Interval Oxygen   Interval Oxygen? Yes     Baseline Oxygen Saturation % 90 %     1 Minute Oxygen Saturation % 87 %     1 Minute Liters of Oxygen 2 L     2 Minute Oxygen Saturation % 86 %     2 Minute Liters of Oxygen 2 L     3 Minute Oxygen Saturation % 85 %     3 Minute Liters of Oxygen 2 L     4 Minute Oxygen Saturation % 87 %     4 Minute Liters of Oxygen 2 L     5 Minute Oxygen Saturation % 86 %     5 Minute Liters of Oxygen 2 L     6 Minute Oxygen Saturation % 85 %     6 Minute Liters of Oxygen 2 L     2 Minute Post Oxygen Saturation % 93 %     2 Minute Post Liters of Oxygen 2 L

## 2023-10-24 DIAGNOSIS — I10 Essential (primary) hypertension: Secondary | ICD-10-CM | POA: Diagnosis not present

## 2023-10-24 DIAGNOSIS — G4733 Obstructive sleep apnea (adult) (pediatric): Secondary | ICD-10-CM | POA: Diagnosis not present

## 2023-11-03 DIAGNOSIS — F3341 Major depressive disorder, recurrent, in partial remission: Secondary | ICD-10-CM | POA: Diagnosis not present

## 2023-11-03 DIAGNOSIS — G4733 Obstructive sleep apnea (adult) (pediatric): Secondary | ICD-10-CM | POA: Diagnosis not present

## 2023-11-03 DIAGNOSIS — I272 Pulmonary hypertension, unspecified: Secondary | ICD-10-CM | POA: Diagnosis not present

## 2023-11-03 DIAGNOSIS — I10 Essential (primary) hypertension: Secondary | ICD-10-CM | POA: Diagnosis not present

## 2023-11-16 DIAGNOSIS — J4540 Moderate persistent asthma, uncomplicated: Secondary | ICD-10-CM | POA: Diagnosis not present

## 2023-11-22 DIAGNOSIS — I10 Essential (primary) hypertension: Secondary | ICD-10-CM | POA: Diagnosis not present

## 2023-11-22 DIAGNOSIS — G4733 Obstructive sleep apnea (adult) (pediatric): Secondary | ICD-10-CM | POA: Diagnosis not present

## 2023-11-24 DIAGNOSIS — I10 Essential (primary) hypertension: Secondary | ICD-10-CM | POA: Diagnosis not present

## 2023-11-24 DIAGNOSIS — G4733 Obstructive sleep apnea (adult) (pediatric): Secondary | ICD-10-CM | POA: Diagnosis not present

## 2023-12-04 DIAGNOSIS — G4733 Obstructive sleep apnea (adult) (pediatric): Secondary | ICD-10-CM | POA: Diagnosis not present

## 2023-12-04 DIAGNOSIS — I10 Essential (primary) hypertension: Secondary | ICD-10-CM | POA: Diagnosis not present

## 2023-12-04 DIAGNOSIS — F3341 Major depressive disorder, recurrent, in partial remission: Secondary | ICD-10-CM | POA: Diagnosis not present

## 2023-12-04 DIAGNOSIS — I272 Pulmonary hypertension, unspecified: Secondary | ICD-10-CM | POA: Diagnosis not present

## 2023-12-20 DIAGNOSIS — G4733 Obstructive sleep apnea (adult) (pediatric): Secondary | ICD-10-CM | POA: Diagnosis not present

## 2023-12-20 DIAGNOSIS — I10 Essential (primary) hypertension: Secondary | ICD-10-CM | POA: Diagnosis not present

## 2024-01-01 DIAGNOSIS — G4733 Obstructive sleep apnea (adult) (pediatric): Secondary | ICD-10-CM | POA: Diagnosis not present

## 2024-01-01 DIAGNOSIS — I10 Essential (primary) hypertension: Secondary | ICD-10-CM | POA: Diagnosis not present

## 2024-01-01 DIAGNOSIS — F3341 Major depressive disorder, recurrent, in partial remission: Secondary | ICD-10-CM | POA: Diagnosis not present

## 2024-01-01 DIAGNOSIS — I272 Pulmonary hypertension, unspecified: Secondary | ICD-10-CM | POA: Diagnosis not present

## 2024-01-20 DIAGNOSIS — I10 Essential (primary) hypertension: Secondary | ICD-10-CM | POA: Diagnosis not present

## 2024-01-20 DIAGNOSIS — G4733 Obstructive sleep apnea (adult) (pediatric): Secondary | ICD-10-CM | POA: Diagnosis not present

## 2024-01-23 DIAGNOSIS — G4733 Obstructive sleep apnea (adult) (pediatric): Secondary | ICD-10-CM | POA: Diagnosis not present

## 2024-01-26 DIAGNOSIS — M810 Age-related osteoporosis without current pathological fracture: Secondary | ICD-10-CM | POA: Diagnosis not present

## 2024-01-26 DIAGNOSIS — D869 Sarcoidosis, unspecified: Secondary | ICD-10-CM | POA: Diagnosis not present

## 2024-02-01 DIAGNOSIS — I272 Pulmonary hypertension, unspecified: Secondary | ICD-10-CM | POA: Diagnosis not present

## 2024-02-01 DIAGNOSIS — G4733 Obstructive sleep apnea (adult) (pediatric): Secondary | ICD-10-CM | POA: Diagnosis not present

## 2024-02-01 DIAGNOSIS — I10 Essential (primary) hypertension: Secondary | ICD-10-CM | POA: Diagnosis not present

## 2024-02-01 DIAGNOSIS — F3341 Major depressive disorder, recurrent, in partial remission: Secondary | ICD-10-CM | POA: Diagnosis not present

## 2024-02-20 DIAGNOSIS — G4733 Obstructive sleep apnea (adult) (pediatric): Secondary | ICD-10-CM | POA: Diagnosis not present

## 2024-02-20 DIAGNOSIS — I10 Essential (primary) hypertension: Secondary | ICD-10-CM | POA: Diagnosis not present

## 2024-02-22 DIAGNOSIS — I272 Pulmonary hypertension, unspecified: Secondary | ICD-10-CM | POA: Diagnosis not present

## 2024-02-22 DIAGNOSIS — D869 Sarcoidosis, unspecified: Secondary | ICD-10-CM | POA: Diagnosis not present

## 2024-02-22 DIAGNOSIS — M3500 Sicca syndrome, unspecified: Secondary | ICD-10-CM | POA: Diagnosis not present

## 2024-02-22 DIAGNOSIS — G4733 Obstructive sleep apnea (adult) (pediatric): Secondary | ICD-10-CM | POA: Diagnosis not present

## 2024-02-23 DIAGNOSIS — G4733 Obstructive sleep apnea (adult) (pediatric): Secondary | ICD-10-CM | POA: Diagnosis not present

## 2024-02-23 DIAGNOSIS — I10 Essential (primary) hypertension: Secondary | ICD-10-CM | POA: Diagnosis not present

## 2024-03-02 DIAGNOSIS — I10 Essential (primary) hypertension: Secondary | ICD-10-CM | POA: Diagnosis not present

## 2024-03-02 DIAGNOSIS — G4733 Obstructive sleep apnea (adult) (pediatric): Secondary | ICD-10-CM | POA: Diagnosis not present

## 2024-03-02 DIAGNOSIS — F3341 Major depressive disorder, recurrent, in partial remission: Secondary | ICD-10-CM | POA: Diagnosis not present

## 2024-03-02 DIAGNOSIS — I272 Pulmonary hypertension, unspecified: Secondary | ICD-10-CM | POA: Diagnosis not present

## 2024-03-14 DIAGNOSIS — M810 Age-related osteoporosis without current pathological fracture: Secondary | ICD-10-CM | POA: Diagnosis not present

## 2024-03-22 DIAGNOSIS — G4733 Obstructive sleep apnea (adult) (pediatric): Secondary | ICD-10-CM | POA: Diagnosis not present

## 2024-03-22 DIAGNOSIS — I10 Essential (primary) hypertension: Secondary | ICD-10-CM | POA: Diagnosis not present

## 2024-03-24 DIAGNOSIS — G4733 Obstructive sleep apnea (adult) (pediatric): Secondary | ICD-10-CM | POA: Diagnosis not present

## 2024-03-25 DIAGNOSIS — R0902 Hypoxemia: Secondary | ICD-10-CM | POA: Diagnosis not present

## 2024-03-25 DIAGNOSIS — G4733 Obstructive sleep apnea (adult) (pediatric): Secondary | ICD-10-CM | POA: Diagnosis not present

## 2024-04-02 DIAGNOSIS — I272 Pulmonary hypertension, unspecified: Secondary | ICD-10-CM | POA: Diagnosis not present

## 2024-04-02 DIAGNOSIS — I10 Essential (primary) hypertension: Secondary | ICD-10-CM | POA: Diagnosis not present

## 2024-04-02 DIAGNOSIS — G4733 Obstructive sleep apnea (adult) (pediatric): Secondary | ICD-10-CM | POA: Diagnosis not present

## 2024-04-02 DIAGNOSIS — F3341 Major depressive disorder, recurrent, in partial remission: Secondary | ICD-10-CM | POA: Diagnosis not present

## 2024-04-21 DIAGNOSIS — I10 Essential (primary) hypertension: Secondary | ICD-10-CM | POA: Diagnosis not present

## 2024-04-21 DIAGNOSIS — G4733 Obstructive sleep apnea (adult) (pediatric): Secondary | ICD-10-CM | POA: Diagnosis not present

## 2024-04-24 DIAGNOSIS — G4733 Obstructive sleep apnea (adult) (pediatric): Secondary | ICD-10-CM | POA: Diagnosis not present

## 2024-05-02 DIAGNOSIS — F3341 Major depressive disorder, recurrent, in partial remission: Secondary | ICD-10-CM | POA: Diagnosis not present

## 2024-05-02 DIAGNOSIS — I272 Pulmonary hypertension, unspecified: Secondary | ICD-10-CM | POA: Diagnosis not present

## 2024-05-02 DIAGNOSIS — I10 Essential (primary) hypertension: Secondary | ICD-10-CM | POA: Diagnosis not present

## 2024-05-21 DIAGNOSIS — I10 Essential (primary) hypertension: Secondary | ICD-10-CM | POA: Diagnosis not present

## 2024-05-25 DIAGNOSIS — G4733 Obstructive sleep apnea (adult) (pediatric): Secondary | ICD-10-CM | POA: Diagnosis not present

## 2024-06-02 DIAGNOSIS — I10 Essential (primary) hypertension: Secondary | ICD-10-CM | POA: Diagnosis not present

## 2024-06-02 DIAGNOSIS — I272 Pulmonary hypertension, unspecified: Secondary | ICD-10-CM | POA: Diagnosis not present

## 2024-06-02 DIAGNOSIS — F3341 Major depressive disorder, recurrent, in partial remission: Secondary | ICD-10-CM | POA: Diagnosis not present

## 2024-06-21 ENCOUNTER — Other Ambulatory Visit: Payer: Self-pay | Admitting: Internal Medicine

## 2024-06-21 DIAGNOSIS — F39 Unspecified mood [affective] disorder: Secondary | ICD-10-CM

## 2024-06-21 DIAGNOSIS — I10 Essential (primary) hypertension: Secondary | ICD-10-CM | POA: Diagnosis not present

## 2024-06-21 NOTE — Telephone Encounter (Signed)
 Requested medication (s) are due for refill today: yes  Requested medication (s) are on the active medication list: yes  Last refill:  07/17/23 #90 3 RF  Future visit scheduled: yes  Notes to clinic:  called pt and appt made to establish care with Dr Sol since current PCP is retiring   Requested Prescriptions  Pending Prescriptions Disp Refills   escitalopram  (LEXAPRO ) 20 MG tablet [Pharmacy Med Name: ESCITALOPRAM  20 MG TABLET] 90 tablet 3    Sig: TAKE 1 TABLET BY MOUTH EVERY DAY     Psychiatry:  Antidepressants - SSRI Failed - 06/21/2024 11:49 AM      Failed - Completed PHQ-2 or PHQ-9 in the last 360 days      Failed - Valid encounter within last 6 months    Recent Outpatient Visits   None

## 2024-06-21 NOTE — Telephone Encounter (Signed)
 Called pt and appt made to establish care. Appt made to establish with Dr Sol. Pt needs refill.

## 2024-06-25 DIAGNOSIS — G4733 Obstructive sleep apnea (adult) (pediatric): Secondary | ICD-10-CM | POA: Diagnosis not present

## 2024-07-03 ENCOUNTER — Encounter: Admitting: Family Medicine

## 2024-07-03 DIAGNOSIS — F3341 Major depressive disorder, recurrent, in partial remission: Secondary | ICD-10-CM | POA: Diagnosis not present

## 2024-07-03 DIAGNOSIS — I10 Essential (primary) hypertension: Secondary | ICD-10-CM | POA: Diagnosis not present

## 2024-07-03 DIAGNOSIS — I272 Pulmonary hypertension, unspecified: Secondary | ICD-10-CM | POA: Diagnosis not present

## 2024-07-03 DIAGNOSIS — G4733 Obstructive sleep apnea (adult) (pediatric): Secondary | ICD-10-CM | POA: Diagnosis not present

## 2024-07-09 ENCOUNTER — Other Ambulatory Visit
Admission: RE | Admit: 2024-07-09 | Discharge: 2024-07-09 | Disposition: A | Discharge status: Home / Self Care | Source: Ambulatory Visit | Attending: Pulmonary Disease | Admitting: Pulmonary Disease

## 2024-07-09 DIAGNOSIS — E079 Disorder of thyroid, unspecified: Secondary | ICD-10-CM | POA: Diagnosis not present

## 2024-07-09 DIAGNOSIS — J4541 Moderate persistent asthma with (acute) exacerbation: Secondary | ICD-10-CM | POA: Diagnosis not present

## 2024-07-09 DIAGNOSIS — R0602 Shortness of breath: Secondary | ICD-10-CM | POA: Diagnosis not present

## 2024-07-09 DIAGNOSIS — J8283 Eosinophilic asthma: Secondary | ICD-10-CM | POA: Diagnosis not present

## 2024-07-09 LAB — D-DIMER, QUANTITATIVE: D-Dimer, Quant: 2.02 ug{FEU}/mL — ABNORMAL HIGH (ref 0.00–0.50)

## 2024-07-11 ENCOUNTER — Other Ambulatory Visit: Payer: Self-pay | Admitting: Emergency Medicine

## 2024-07-11 ENCOUNTER — Ambulatory Visit
Admission: RE | Admit: 2024-07-11 | Discharge: 2024-07-11 | Disposition: A | Discharge status: Home / Self Care | Source: Ambulatory Visit | Attending: Emergency Medicine | Admitting: Emergency Medicine

## 2024-07-11 DIAGNOSIS — R7989 Other specified abnormal findings of blood chemistry: Secondary | ICD-10-CM

## 2024-07-11 DIAGNOSIS — J8410 Pulmonary fibrosis, unspecified: Secondary | ICD-10-CM | POA: Diagnosis not present

## 2024-07-11 DIAGNOSIS — K449 Diaphragmatic hernia without obstruction or gangrene: Secondary | ICD-10-CM | POA: Diagnosis not present

## 2024-07-11 DIAGNOSIS — R590 Localized enlarged lymph nodes: Secondary | ICD-10-CM | POA: Diagnosis not present

## 2024-07-11 LAB — POCT I-STAT CREATININE: Creatinine, Ser: 1.2 mg/dL — ABNORMAL HIGH (ref 0.44–1.00)

## 2024-07-11 MED ORDER — IOHEXOL 350 MG/ML SOLN
75.0000 mL | Freq: Once | INTRAVENOUS | Status: AC | PRN
Start: 2024-07-11 — End: 2024-07-11
  Administered 2024-07-11: 75 mL via INTRAVENOUS

## 2024-07-18 ENCOUNTER — Encounter: Payer: Self-pay | Admitting: Medical

## 2024-07-18 ENCOUNTER — Ambulatory Visit

## 2024-07-18 ENCOUNTER — Ambulatory Visit: Attending: Medical | Admitting: Medical

## 2024-07-18 VITALS — BP 140/78 | HR 100 | Wt 179.0 lb

## 2024-07-18 DIAGNOSIS — Z79899 Other long term (current) drug therapy: Secondary | ICD-10-CM | POA: Diagnosis not present

## 2024-07-18 DIAGNOSIS — I272 Pulmonary hypertension, unspecified: Secondary | ICD-10-CM

## 2024-07-18 DIAGNOSIS — J9611 Chronic respiratory failure with hypoxia: Secondary | ICD-10-CM | POA: Diagnosis not present

## 2024-07-18 DIAGNOSIS — D869 Sarcoidosis, unspecified: Secondary | ICD-10-CM | POA: Diagnosis not present

## 2024-07-18 DIAGNOSIS — R55 Syncope and collapse: Secondary | ICD-10-CM

## 2024-07-18 DIAGNOSIS — I10 Essential (primary) hypertension: Secondary | ICD-10-CM

## 2024-07-18 NOTE — Progress Notes (Unsigned)
 Cardiology Office Note   Date:  07/18/2024  ID:  Stacie [name], DOB [birth year], MRN [id number] PCP: Justus Leita DEL, MD  [facility] Providers Cardiologist:  Lonni Hanson, MD     History of Present Illness Stacie [name] is a 68 y.o. 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   She has a long history of pulmonary sarcoidosis and was followed at [facility] for many years.  She was hospitalized at [facility] in 2021 with severe sepsis due to Klebsiella infection related to bilateral nephrolithiasis complicated by watershed/embolic CVA and digital ischemia.  We had a lengthy recovery with multiple subsequent hospitalizations.  She has been followed closely by pulmonology who has been managing her sarcoidosis.  She is currently on prednisone  and inhaled treprosinil.  She is using 2.5 L of oxygen  and nocturnal CPAP.   Patient was seen by Dr. Hanson in February 2024 reporting vague chest pain and shortness of breath.  Patient was set up for right and left heart cath and an echo was ordered.Right and left cardiac cath showed mild luminal irregularities involving ostial proximal LAD.  Otherwise no significant CAD.  Normal left heart filling pressures, borderline elevated right heart filling pressure, mild pulmonary hypertension.  Recommended primary prevention of CAD and ongoing management of pulmonary hypertension by pulmonology. Echo 02/2023 showed normal LVEF, grade 1 diastolic dysfunction, mild LVH, mild MR. Cardiac MRI showed EF of 66%, small epicardial LV LGE at RV insertion points, no evidence of infiltrative myocardial disease, normal RV size and function, LGE confined to RV IP usually considered benign and associated with  female gender.  No evidence of cardiac sarcoid.  The patient was last seen 02/2024 and was overall doing OK on 2L O2. EKG shows ST HR 103bpm, which seemed to be normal for the patient.   Today, the patient is on 2.5L O2 at all times. She is getting over a URI and required 3 L O2 at that time. She reports occasional dizziness.  Today blood pressure is mildly elevated.  She does not check her BP at home. She did fall two weeks ago from a syncopal episode. She felt dizzy and lightheaded before passing out. She sat down on the kitchen floor and then passed out. She quickly came to. Says she has pre-syncope every so often.  She denies any chest pain.  Studies Reviewed EKG Interpretation Date/Time:  Thursday July 18 2024 14:38:18 EDT Ventricular Rate:  100 PR Interval:  132 QRS Duration:  82 QT Interval:  362 QTC Calculation: 466 R Axis:   -12  Text Interpretation: Normal sinus rhythm Left atrial enlargement When compared with ECG of 19-Oct-2021 20:13, PREVIOUS ECG IS PRESENT Confirmed by Franchester, Daphane Odekirk ([id number]) on 07/18/2024 2:48:47 PM     cMRI 03/2023 IMPRESSION: 1.  Normal LV size and systolic function.  LVEF 66%.   2.  Small epicardial LV LGE at the RV insertion points (RVIP).   3.  No evidence for infiltrative myocardiac disease.  Normal ECV.   4.  Normal RV size and function.   5. LGE confined to RVIP is considered benign, usually associated with female gender, elevated diastolic BP, LBBB, QTc prolongation.   6.  No evidence for cardiac sarcoid.    Echo 03/01/23   1. Left ventricular ejection  fraction, by estimation, is 65 to 70%. The  left ventricle has normal function. The left ventricle has no regional  wall motion abnormalities. There is mild left ventricular hypertrophy.  Left ventricular diastolic parameters  are consistent with Grade I diastolic dysfunction (impaired relaxation).   2. Right ventricular systolic function is normal. The right ventricular  size is normal.    3. The mitral valve is degenerative. Mild mitral valve regurgitation.   4. The aortic valve is tricuspid. Aortic valve regurgitation is not  visualized.   5. Aortic dilatation noted. There is borderline dilatation of the  ascending aorta, measuring 38 mm.    Cardiac cath 12/2022 Conclusions: Mild luminal irregularities involving the ostial and proximal LAD.  Otherwise, no angiographically significant coronary artery disease. Normal left heart filling pressures (LVEDP 12 mmHg, PCWP 13 mmHg). Borderline elevated right heart filling pressure (mean RA/RVEDP 8 mmHg). Mild pulmonary hypertension (mean PA 28 mmHg, PVR 3.1 WU). Normal Fick cardiac output/index.   Recommendations: Primary prevention of coronary artery disease. Ongoing management of pulmonary hypertension per Dr. Aleskerov.   Lonni Hanson, MD [facility]     Recommendations       Antiplatelet/Anticoag No indication for antiplatelet therapy at this time .  Discharge Date In the absence of any other complications or medical issues, we expect the patient to be ready for discharge from a cath perspective on 12/23/2022.      Physical Exam VS:  BP (!) 140/78 (BP Location: Left Arm, Patient Position: Sitting, Cuff Size: Normal)   Pulse 100   Wt 179 lb (81.2 kg)   LMP  (LMP Unknown)   SpO2 96% Comment: 3 L  BMI 28.04 kg/m   Orthostatic VS for the past 24 hrs (Last 3 readings):  BP- Lying Pulse- Lying BP- Sitting Pulse- Sitting BP- Standing at 0 minutes Pulse- Standing at 0 minutes BP- Standing at 3 minutes Pulse- Standing at 3 minutes  07/18/24 1515 153/84 98 140/86 98 132/82 97 157/80 97      Wt Readings from Last 3 Encounters:  07/18/24 179 lb (81.2 kg)  06/12/23 187 lb 11.2 oz (85.1 kg)  03/02/23 190 lb (86.2 kg)    GEN: Well nourished, well developed in no acute distress NECK: No JVD; No carotid bruits CARDIAC: RRR, no murmurs, rubs, gallops RESPIRATORY:  wheezing and rhonchi ABDOMEN: Soft, non-tender,  non-distended EXTREMITIES:  No edema; No deformity   ASSESSMENT AND PLAN  Syncope Patient reports intermittent presyncope and 1 syncopal episode 2 weeks ago.  Vasovagal versus orthostatic. Orthostatics negative today. I will order an echocardiogram, 2-week heart monitor, CBC, CMP.   Sarcoidosis Pulmonary HTN Chronic respiratory failure on 2.5L O2 She has a long history of sarcoidosis.  Right and left heart cath in March 2024 showed normal left heart filling pressures, borderline elevated right heart filling pressure, mild pulmonary hypertension, nonobstructive CAD.  Echo showed EF 65 to 70%, mild LVH, grade 1 diastolic dysfunction, mild MR.  Cardiac MRI showed LVEF 66%, no evidence of infiltrative myocardial disease, no cardiac sarcoid.  Patient follows with pulmonology.  Repeat echo as above.  HTN BP is mildly elevated today.  She does not check this at home.  I recommend she take blood pressure at home twice a week while at rest.  Patient may need antihypertensive if blood pressure is elevated at home.     Dispo: Follow-up in 3 months  Signed, Bennett Vanscyoc VEAR Fishman, PA-C

## 2024-07-18 NOTE — Patient Instructions (Signed)
 Medication Instructions:  Your physician recommends that you continue on your current medications as directed. Please refer to the Current Medication list given to you today.  *If you need a refill on your cardiac medications before your next appointment, please call your pharmacy*  Lab Work: Your provider would like for you to have following labs drawn today CBC, CMET.   If you have labs (blood work) drawn today and your tests are completely normal, you will receive your results only by: MyChart Message (if you have MyChart) OR A paper copy in the mail If you have any lab test that is abnormal or we need to change your treatment, we will call you to review the results.  Testing/Procedures: Your physician has requested that you have an echocardiogram. Echocardiography is a painless test that uses sound waves to create images of your heart. It provides your doctor with information about the size and shape of your heart and how well your heart's chambers and valves are working.   You may receive an ultrasound enhancing agent through an IV if needed to better visualize your heart during the echo. This procedure takes approximately one hour.  There are no restrictions for this procedure.  This will take place at [address] [facility]) [address], [place] [address]  Please note: We ask at that you not bring children with you during ultrasound (echo/ vascular) testing. Due to room size and safety concerns, children are not allowed in the ultrasound rooms during exams. Our front office staff cannot provide observation of children in our lobby area while testing is being conducted. An adult accompanying a patient to their appointment will only be allowed in the ultrasound room at the discretion of the ultrasound technician under special circumstances. We apologize for any inconvenience.    ZIO XT- Long Term Monitor Instructions  Your physician has requested you wear a ZIO patch monitor  for 14 days.  This is a single patch monitor. [employer] supplies one patch monitor per enrollment. Additional stickers are not available. Please do not apply patch if you will be having a Nuclear Stress Test, Echocardiogram, Cardiac CT, MRI, or Chest Xray during the period you would be wearing the monitor. The patch cannot be worn during these tests. You cannot remove and re-apply the ZIO XT patch monitor.  Your ZIO patch monitor will be mailed 3 day USPS to your address on file. It may take 3-5 days to receive your monitor after you have been enrolled. Once you have received your monitor, please review the enclosed instructions. Your monitor has already been registered assigning a specific monitor serial number to you.  Billing and Patient Assistance Program Information  We have supplied [employer] with any of your insurance information on file for billing purposes.  [employer] offers a sliding scale Patient Assistance Program for patients that do not have insurance, or whose insurance does not completely cover the cost of the ZIO monitor.  You must apply for the Patient Assistance Program to qualify for this discounted rate.  To apply, please call [employer] at [phone], select option 4, select option 2, ask to apply for Patient Assistance Program. Meredeth will ask your household income, and how many people are in your household. They will quote your out-of-pocket cost based on that information. [employer] will also be able to set up a 96-month, interest-free payment plan if needed.  Applying the monitor   Hold abrader disc by orange tab. Rub abrader in 40 strokes over the upper  left chest as indicated in your monitor instructions.  Clean area with 4 enclosed alcohol pads. Let dry.  Apply patch as indicated in monitor instructions. Patch will be placed under collarbone on left side of chest with arrow pointing upward.  Rub patch adhesive wings for 2 minutes. Remove white label marked 1. Remove the white label  marked 2. Rub patch adhesive wings for 2 additional minutes.  While looking in a mirror, press and release button in center of patch. A small green light will flash 3-4 times. This will be your only indicator that the monitor has been turned on.   After applying Do not shower for the first 24 hours. You may shower after the first 24 hours.  Press the button if you feel a symptom. You will hear a small click. Record Date, Time and Symptom in the Patient Logbook.   AFTER 14 DAYS When you are ready to remove the patch, follow instructions on the last 2 pages of Patient Logbook.  Stick patch monitor into the tabs at the bottom of the return box.  Place Patient Logbook in the blue and white box. Use locking tab on box and tape box closed securely. The blue and white box has prepaid postage on it. Please place it in the mailbox as soon as possible. Your physician should have your test results approximately 7-14 days after the monitor has been mailed back to [facility].  Call [facility] Customer Care at [phone] if you have questions regarding your ZIO XT patch monitor.  Call them immediately if you see an orange light blinking on your monitor.  If your monitor falls off in less than 4 days, contact our Monitor department at [phone].  If your monitor becomes loose or falls off after 4 days call [employer] at [phone] for suggestions on securing your monitor.   Follow-Up: At [facility], you and your health needs are our priority.  As part of our continuing mission to provide you with exceptional heart care, our providers are all part of one team.  This team includes your primary Cardiologist (physician) and Advanced Practice Providers or APPs (Physician Assistants and Nurse Practitioners) who all work together to provide you with the care you need, when you need it.  Your next appointment:   3 month(s)  Provider:   You may see Lonni Hanson, MD or one of the  following Advanced Practice Providers on your designated Care Team:    Cadence Franchester, [place]   We recommend signing up for the patient portal called MyChart.  Sign up information is provided on this After Visit Summary.  MyChart is used to connect with patients for Virtual Visits (Telemedicine).  Patients are able to view lab/test results, encounter notes, upcoming appointments, etc.  Non-urgent messages can be sent to your provider as well.   To learn more about what you can do with MyChart, go to [email or web address].

## 2024-07-19 ENCOUNTER — Ambulatory Visit: Payer: Self-pay | Admitting: Medical

## 2024-07-19 LAB — CBC
Hematocrit: 41.5 % (ref 34.0–46.6)
Hemoglobin: 13.7 g/dL (ref 11.1–15.9)
MCH: 30 pg (ref 26.6–33.0)
MCHC: 33 g/dL (ref 31.5–35.7)
MCV: 91 fL (ref 79–97)
Platelets: 348 x10E3/uL (ref 150–450)
RBC: 4.57 x10E6/uL (ref 3.77–5.28)
RDW: 13 % (ref 11.7–15.4)
WBC: 14.6 x10E3/uL — ABNORMAL HIGH (ref 3.4–10.8)

## 2024-07-19 LAB — COMPREHENSIVE METABOLIC PANEL WITH GFR
ALT: 16 IU/L (ref 0–32)
AST: 25 IU/L (ref 0–40)
Albumin: 3.8 g/dL — ABNORMAL LOW (ref 3.9–4.9)
Alkaline Phosphatase: 89 IU/L (ref 49–135)
BUN/Creatinine Ratio: 24 (ref 12–28)
BUN: 29 mg/dL — ABNORMAL HIGH (ref 8–27)
Bilirubin Total: <0.2 mg/dL (ref 0.0–1.2)
CO2: 24 mmol/L (ref 20–29)
Calcium: 9.4 mg/dL (ref 8.7–10.3)
Chloride: 100 mmol/L (ref 96–106)
Creatinine, Ser: 1.21 mg/dL — ABNORMAL HIGH (ref 0.57–1.00)
Globulin, Total: 3.3 g/dL (ref 1.5–4.5)
Glucose: 161 mg/dL — ABNORMAL HIGH (ref 70–99)
Potassium: 4.9 mmol/L (ref 3.5–5.2)
Sodium: 142 mmol/L (ref 134–144)
Total Protein: 7.1 g/dL (ref 6.0–8.5)
eGFR: 49 mL/min/1.73 — ABNORMAL LOW (ref >59)

## 2024-07-21 DIAGNOSIS — I10 Essential (primary) hypertension: Secondary | ICD-10-CM | POA: Diagnosis not present

## 2024-07-22 DIAGNOSIS — G4733 Obstructive sleep apnea (adult) (pediatric): Secondary | ICD-10-CM | POA: Diagnosis not present

## 2024-07-22 DIAGNOSIS — J9611 Chronic respiratory failure with hypoxia: Secondary | ICD-10-CM | POA: Diagnosis not present

## 2024-07-22 DIAGNOSIS — J8410 Pulmonary fibrosis, unspecified: Secondary | ICD-10-CM | POA: Diagnosis not present

## 2024-07-22 DIAGNOSIS — D869 Sarcoidosis, unspecified: Secondary | ICD-10-CM | POA: Diagnosis not present

## 2024-07-24 DIAGNOSIS — G4733 Obstructive sleep apnea (adult) (pediatric): Secondary | ICD-10-CM | POA: Diagnosis not present

## 2024-07-25 DIAGNOSIS — R0902 Hypoxemia: Secondary | ICD-10-CM | POA: Diagnosis not present

## 2024-07-25 DIAGNOSIS — G4733 Obstructive sleep apnea (adult) (pediatric): Secondary | ICD-10-CM | POA: Diagnosis not present

## 2024-08-01 DIAGNOSIS — D869 Sarcoidosis, unspecified: Secondary | ICD-10-CM | POA: Diagnosis not present

## 2024-08-01 DIAGNOSIS — M810 Age-related osteoporosis without current pathological fracture: Secondary | ICD-10-CM | POA: Diagnosis not present

## 2024-08-01 DIAGNOSIS — Z7952 Long term (current) use of systemic steroids: Secondary | ICD-10-CM | POA: Diagnosis not present

## 2024-08-02 DIAGNOSIS — I272 Pulmonary hypertension, unspecified: Secondary | ICD-10-CM | POA: Diagnosis not present

## 2024-08-02 DIAGNOSIS — G4733 Obstructive sleep apnea (adult) (pediatric): Secondary | ICD-10-CM | POA: Diagnosis not present

## 2024-08-02 DIAGNOSIS — F3341 Major depressive disorder, recurrent, in partial remission: Secondary | ICD-10-CM | POA: Diagnosis not present

## 2024-08-02 DIAGNOSIS — I10 Essential (primary) hypertension: Secondary | ICD-10-CM | POA: Diagnosis not present

## 2024-08-19 DIAGNOSIS — Z0000 Encounter for general adult medical examination without abnormal findings: Secondary | ICD-10-CM | POA: Diagnosis not present

## 2024-08-19 DIAGNOSIS — R7303 Prediabetes: Secondary | ICD-10-CM | POA: Diagnosis not present

## 2024-08-19 DIAGNOSIS — E782 Mixed hyperlipidemia: Secondary | ICD-10-CM | POA: Diagnosis not present

## 2024-08-19 DIAGNOSIS — I272 Pulmonary hypertension, unspecified: Secondary | ICD-10-CM | POA: Diagnosis not present

## 2024-08-19 DIAGNOSIS — N1831 Chronic kidney disease, stage 3a: Secondary | ICD-10-CM | POA: Diagnosis not present

## 2024-08-19 DIAGNOSIS — Z1331 Encounter for screening for depression: Secondary | ICD-10-CM | POA: Diagnosis not present

## 2024-08-19 DIAGNOSIS — G4733 Obstructive sleep apnea (adult) (pediatric): Secondary | ICD-10-CM | POA: Diagnosis not present

## 2024-08-19 DIAGNOSIS — E039 Hypothyroidism, unspecified: Secondary | ICD-10-CM | POA: Diagnosis not present

## 2024-08-19 DIAGNOSIS — I10 Essential (primary) hypertension: Secondary | ICD-10-CM | POA: Diagnosis not present

## 2024-08-20 DIAGNOSIS — R55 Syncope and collapse: Secondary | ICD-10-CM

## 2024-08-22 DIAGNOSIS — I10 Essential (primary) hypertension: Secondary | ICD-10-CM | POA: Diagnosis not present

## 2024-09-26 ENCOUNTER — Other Ambulatory Visit: Attending: Medical

## 2024-09-26 DIAGNOSIS — R55 Syncope and collapse: Secondary | ICD-10-CM

## 2024-09-27 LAB — ECHOCARDIOGRAM COMPLETE
AR max vel: 1.19 cm2
AV Area VTI: 1.35 cm2
AV Area mean vel: 1.36 cm2
AV Mean grad: 8 mmHg
AV Peak grad: 15.7 mmHg
Ao pk vel: 1.98 m/s
Area-P 1/2: 3.89 cm2
MV VTI: 1.27 cm2
S' Lateral: 2.4 cm

## 2024-11-01 ENCOUNTER — Ambulatory Visit: Admitting: Medical
